# Patient Record
Sex: Female | Born: 1945
Health system: Southern US, Community
[De-identification: ages and names within clinical notes are randomized; demographics above are authoritative.]

## PROBLEM LIST (undated history)

## (undated) ENCOUNTER — Emergency Department (HOSPITAL_COMMUNITY): Payer: Self-pay | Source: Home / Self Care

## (undated) DIAGNOSIS — R251 Tremor, unspecified: Secondary | ICD-10-CM

## (undated) DIAGNOSIS — I10 Essential (primary) hypertension: Secondary | ICD-10-CM

## (undated) DIAGNOSIS — K922 Gastrointestinal hemorrhage, unspecified: Secondary | ICD-10-CM

## (undated) DIAGNOSIS — J45909 Unspecified asthma, uncomplicated: Secondary | ICD-10-CM

## (undated) DIAGNOSIS — C801 Malignant (primary) neoplasm, unspecified: Secondary | ICD-10-CM

## (undated) DIAGNOSIS — I2510 Atherosclerotic heart disease of native coronary artery without angina pectoris: Secondary | ICD-10-CM

## (undated) DIAGNOSIS — E119 Type 2 diabetes mellitus without complications: Secondary | ICD-10-CM

## (undated) DIAGNOSIS — R569 Unspecified convulsions: Secondary | ICD-10-CM

## (undated) DIAGNOSIS — R519 Headache, unspecified: Secondary | ICD-10-CM

## (undated) DIAGNOSIS — I639 Cerebral infarction, unspecified: Secondary | ICD-10-CM

## (undated) DIAGNOSIS — J449 Chronic obstructive pulmonary disease, unspecified: Secondary | ICD-10-CM

## (undated) DIAGNOSIS — E785 Hyperlipidemia, unspecified: Secondary | ICD-10-CM

## (undated) DIAGNOSIS — R51 Headache: Secondary | ICD-10-CM

## (undated) DIAGNOSIS — E079 Disorder of thyroid, unspecified: Secondary | ICD-10-CM

## (undated) DIAGNOSIS — K219 Gastro-esophageal reflux disease without esophagitis: Secondary | ICD-10-CM

## (undated) DIAGNOSIS — I4891 Unspecified atrial fibrillation: Secondary | ICD-10-CM

## (undated) DIAGNOSIS — C4490 Unspecified malignant neoplasm of skin, unspecified: Secondary | ICD-10-CM

## (undated) DIAGNOSIS — I509 Heart failure, unspecified: Secondary | ICD-10-CM

## (undated) DIAGNOSIS — M1990 Unspecified osteoarthritis, unspecified site: Secondary | ICD-10-CM

## (undated) HISTORY — DX: Headache, unspecified: R51.9

## (undated) HISTORY — PX: ROTATOR CUFF REPAIR: SHX139

## (undated) HISTORY — PX: OTHER SURGICAL HISTORY: SHX169

## (undated) HISTORY — PX: KNEE SURGERY: SHX244

## (undated) HISTORY — PX: BACK SURGERY: SHX140

## (undated) HISTORY — PX: CAROTID ENDARTERECTOMY: SUR193

## (undated) HISTORY — DX: Gastro-esophageal reflux disease without esophagitis: K21.9

## (undated) HISTORY — DX: Headache: R51

## (undated) HISTORY — DX: Disorder of thyroid, unspecified: E07.9

---
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
## 1974-01-13 HISTORY — PX: ABDOMINAL HYSTERECTOMY: SHX81

## 1978-01-13 HISTORY — PX: CHOLECYSTECTOMY: SHX55

## 1978-01-13 HISTORY — PX: APPENDECTOMY: SHX54

## 1978-01-13 HISTORY — PX: GALLBLADDER SURGERY: SHX652

## 2003-03-29 ENCOUNTER — Other Ambulatory Visit: Payer: Self-pay

## 2003-04-03 ENCOUNTER — Other Ambulatory Visit: Payer: Self-pay

## 2003-05-26 ENCOUNTER — Other Ambulatory Visit: Payer: Self-pay

## 2003-09-12 ENCOUNTER — Other Ambulatory Visit: Payer: Self-pay

## 2003-10-14 ENCOUNTER — Encounter: Payer: Self-pay | Admitting: Internal Medicine

## 2003-10-25 ENCOUNTER — Emergency Department: Payer: Self-pay | Admitting: Emergency Medicine

## 2003-10-25 ENCOUNTER — Other Ambulatory Visit: Payer: Self-pay

## 2003-11-08 ENCOUNTER — Other Ambulatory Visit: Payer: Self-pay

## 2003-11-08 ENCOUNTER — Observation Stay: Payer: Self-pay | Admitting: Internal Medicine

## 2003-11-09 ENCOUNTER — Other Ambulatory Visit: Payer: Self-pay

## 2003-11-14 ENCOUNTER — Encounter: Payer: Self-pay | Admitting: Internal Medicine

## 2003-11-25 ENCOUNTER — Other Ambulatory Visit: Payer: Self-pay

## 2003-11-25 ENCOUNTER — Emergency Department: Payer: Self-pay | Admitting: Emergency Medicine

## 2003-12-04 ENCOUNTER — Other Ambulatory Visit: Payer: Self-pay

## 2003-12-04 ENCOUNTER — Emergency Department: Payer: Self-pay | Admitting: Emergency Medicine

## 2003-12-14 ENCOUNTER — Encounter: Payer: Self-pay | Admitting: Internal Medicine

## 2004-01-01 ENCOUNTER — Emergency Department: Payer: Self-pay | Admitting: Emergency Medicine

## 2004-01-14 ENCOUNTER — Encounter: Payer: Self-pay | Admitting: Internal Medicine

## 2004-04-18 ENCOUNTER — Emergency Department: Payer: Self-pay | Admitting: Unknown Physician Specialty

## 2004-06-18 ENCOUNTER — Ambulatory Visit: Payer: Self-pay | Admitting: Rheumatology

## 2004-07-07 ENCOUNTER — Emergency Department: Payer: Self-pay | Admitting: Internal Medicine

## 2004-07-23 ENCOUNTER — Other Ambulatory Visit: Payer: Self-pay

## 2004-07-23 ENCOUNTER — Ambulatory Visit: Payer: Self-pay | Admitting: Unknown Physician Specialty

## 2004-07-26 ENCOUNTER — Inpatient Hospital Stay: Payer: Self-pay | Admitting: Unknown Physician Specialty

## 2004-11-14 ENCOUNTER — Other Ambulatory Visit: Payer: Self-pay

## 2004-11-14 ENCOUNTER — Inpatient Hospital Stay: Payer: Self-pay | Admitting: Internal Medicine

## 2006-07-01 ENCOUNTER — Other Ambulatory Visit: Payer: Self-pay

## 2006-07-01 ENCOUNTER — Emergency Department: Payer: Self-pay | Admitting: Emergency Medicine

## 2006-09-27 ENCOUNTER — Emergency Department: Payer: Self-pay | Admitting: Internal Medicine

## 2006-09-27 ENCOUNTER — Other Ambulatory Visit: Payer: Self-pay

## 2013-01-13 DIAGNOSIS — C4490 Unspecified malignant neoplasm of skin, unspecified: Secondary | ICD-10-CM

## 2013-01-13 HISTORY — DX: Unspecified malignant neoplasm of skin, unspecified: C44.90

## 2014-03-20 DIAGNOSIS — E1149 Type 2 diabetes mellitus with other diabetic neurological complication: Secondary | ICD-10-CM | POA: Diagnosis not present

## 2014-03-20 DIAGNOSIS — M810 Age-related osteoporosis without current pathological fracture: Secondary | ICD-10-CM | POA: Diagnosis not present

## 2014-03-20 DIAGNOSIS — M069 Rheumatoid arthritis, unspecified: Secondary | ICD-10-CM | POA: Diagnosis not present

## 2014-03-20 DIAGNOSIS — K219 Gastro-esophageal reflux disease without esophagitis: Secondary | ICD-10-CM | POA: Diagnosis not present

## 2014-03-20 DIAGNOSIS — I10 Essential (primary) hypertension: Secondary | ICD-10-CM | POA: Diagnosis not present

## 2014-03-20 DIAGNOSIS — Z0000 Encounter for general adult medical examination without abnormal findings: Secondary | ICD-10-CM | POA: Diagnosis not present

## 2014-03-20 DIAGNOSIS — J449 Chronic obstructive pulmonary disease, unspecified: Secondary | ICD-10-CM | POA: Diagnosis not present

## 2014-03-20 DIAGNOSIS — S32010A Wedge compression fracture of first lumbar vertebra, initial encounter for closed fracture: Secondary | ICD-10-CM | POA: Diagnosis not present

## 2014-03-20 DIAGNOSIS — E039 Hypothyroidism, unspecified: Secondary | ICD-10-CM | POA: Diagnosis not present

## 2014-03-20 DIAGNOSIS — E785 Hyperlipidemia, unspecified: Secondary | ICD-10-CM | POA: Diagnosis not present

## 2014-03-20 DIAGNOSIS — E559 Vitamin D deficiency, unspecified: Secondary | ICD-10-CM | POA: Diagnosis not present

## 2014-03-20 DIAGNOSIS — G40909 Epilepsy, unspecified, not intractable, without status epilepticus: Secondary | ICD-10-CM | POA: Diagnosis not present

## 2014-03-20 DIAGNOSIS — I471 Supraventricular tachycardia: Secondary | ICD-10-CM | POA: Diagnosis not present

## 2014-04-06 DIAGNOSIS — H109 Unspecified conjunctivitis: Secondary | ICD-10-CM | POA: Diagnosis not present

## 2014-04-12 DIAGNOSIS — F445 Conversion disorder with seizures or convulsions: Secondary | ICD-10-CM | POA: Diagnosis not present

## 2014-04-12 DIAGNOSIS — F411 Generalized anxiety disorder: Secondary | ICD-10-CM | POA: Diagnosis not present

## 2014-04-12 DIAGNOSIS — I679 Cerebrovascular disease, unspecified: Secondary | ICD-10-CM | POA: Diagnosis not present

## 2014-04-12 DIAGNOSIS — G451 Carotid artery syndrome (hemispheric): Secondary | ICD-10-CM | POA: Diagnosis not present

## 2014-04-24 DIAGNOSIS — I517 Cardiomegaly: Secondary | ICD-10-CM | POA: Diagnosis not present

## 2014-04-24 DIAGNOSIS — G8929 Other chronic pain: Secondary | ICD-10-CM | POA: Diagnosis present

## 2014-04-24 DIAGNOSIS — M6281 Muscle weakness (generalized): Secondary | ICD-10-CM | POA: Diagnosis not present

## 2014-04-24 DIAGNOSIS — G40909 Epilepsy, unspecified, not intractable, without status epilepticus: Secondary | ICD-10-CM | POA: Diagnosis present

## 2014-04-24 DIAGNOSIS — I10 Essential (primary) hypertension: Secondary | ICD-10-CM | POA: Diagnosis not present

## 2014-04-24 DIAGNOSIS — Z955 Presence of coronary angioplasty implant and graft: Secondary | ICD-10-CM | POA: Diagnosis not present

## 2014-04-24 DIAGNOSIS — R29810 Facial weakness: Secondary | ICD-10-CM | POA: Diagnosis present

## 2014-04-24 DIAGNOSIS — F17210 Nicotine dependence, cigarettes, uncomplicated: Secondary | ICD-10-CM | POA: Diagnosis not present

## 2014-04-24 DIAGNOSIS — J449 Chronic obstructive pulmonary disease, unspecified: Secondary | ICD-10-CM | POA: Diagnosis not present

## 2014-04-24 DIAGNOSIS — R531 Weakness: Secondary | ICD-10-CM | POA: Diagnosis not present

## 2014-04-24 DIAGNOSIS — I509 Heart failure, unspecified: Secondary | ICD-10-CM | POA: Diagnosis not present

## 2014-04-24 DIAGNOSIS — R4182 Altered mental status, unspecified: Secondary | ICD-10-CM | POA: Diagnosis not present

## 2014-04-24 DIAGNOSIS — M810 Age-related osteoporosis without current pathological fracture: Secondary | ICD-10-CM | POA: Diagnosis present

## 2014-04-24 DIAGNOSIS — I252 Old myocardial infarction: Secondary | ICD-10-CM | POA: Diagnosis not present

## 2014-04-24 DIAGNOSIS — I63312 Cerebral infarction due to thrombosis of left middle cerebral artery: Secondary | ICD-10-CM | POA: Diagnosis not present

## 2014-04-24 DIAGNOSIS — I6350 Cerebral infarction due to unspecified occlusion or stenosis of unspecified cerebral artery: Secondary | ICD-10-CM | POA: Diagnosis not present

## 2014-04-24 DIAGNOSIS — Z0389 Encounter for observation for other suspected diseases and conditions ruled out: Secondary | ICD-10-CM | POA: Diagnosis not present

## 2014-04-24 DIAGNOSIS — F40240 Claustrophobia: Secondary | ICD-10-CM | POA: Diagnosis present

## 2014-04-24 DIAGNOSIS — I6523 Occlusion and stenosis of bilateral carotid arteries: Secondary | ICD-10-CM | POA: Diagnosis not present

## 2014-04-24 DIAGNOSIS — G8194 Hemiplegia, unspecified affecting left nondominant side: Secondary | ICD-10-CM | POA: Diagnosis not present

## 2014-04-24 DIAGNOSIS — F329 Major depressive disorder, single episode, unspecified: Secondary | ICD-10-CM | POA: Diagnosis not present

## 2014-04-24 DIAGNOSIS — I471 Supraventricular tachycardia: Secondary | ICD-10-CM | POA: Diagnosis not present

## 2014-04-24 DIAGNOSIS — E785 Hyperlipidemia, unspecified: Secondary | ICD-10-CM | POA: Diagnosis present

## 2014-04-24 DIAGNOSIS — R569 Unspecified convulsions: Secondary | ICD-10-CM | POA: Diagnosis not present

## 2014-04-24 DIAGNOSIS — R404 Transient alteration of awareness: Secondary | ICD-10-CM | POA: Diagnosis not present

## 2014-04-24 DIAGNOSIS — I639 Cerebral infarction, unspecified: Secondary | ICD-10-CM | POA: Diagnosis not present

## 2014-04-24 DIAGNOSIS — I2510 Atherosclerotic heart disease of native coronary artery without angina pectoris: Secondary | ICD-10-CM | POA: Diagnosis not present

## 2014-04-24 DIAGNOSIS — D329 Benign neoplasm of meninges, unspecified: Secondary | ICD-10-CM | POA: Diagnosis not present

## 2014-04-24 DIAGNOSIS — R251 Tremor, unspecified: Secondary | ICD-10-CM | POA: Diagnosis present

## 2014-04-24 DIAGNOSIS — E119 Type 2 diabetes mellitus without complications: Secondary | ICD-10-CM | POA: Diagnosis not present

## 2014-04-24 DIAGNOSIS — D420 Neoplasm of uncertain behavior of cerebral meninges: Secondary | ICD-10-CM | POA: Diagnosis not present

## 2014-04-24 DIAGNOSIS — M6289 Other specified disorders of muscle: Secondary | ICD-10-CM | POA: Diagnosis not present

## 2014-04-24 DIAGNOSIS — Z720 Tobacco use: Secondary | ICD-10-CM | POA: Diagnosis not present

## 2014-05-04 DIAGNOSIS — Z720 Tobacco use: Secondary | ICD-10-CM | POA: Diagnosis not present

## 2014-05-04 DIAGNOSIS — S8992XA Unspecified injury of left lower leg, initial encounter: Secondary | ICD-10-CM | POA: Diagnosis not present

## 2014-05-04 DIAGNOSIS — M25462 Effusion, left knee: Secondary | ICD-10-CM | POA: Diagnosis not present

## 2014-05-04 DIAGNOSIS — I10 Essential (primary) hypertension: Secondary | ICD-10-CM | POA: Diagnosis not present

## 2014-05-04 DIAGNOSIS — Y999 Unspecified external cause status: Secondary | ICD-10-CM | POA: Diagnosis not present

## 2014-05-04 DIAGNOSIS — M25562 Pain in left knee: Secondary | ICD-10-CM | POA: Diagnosis not present

## 2014-05-04 DIAGNOSIS — E1140 Type 2 diabetes mellitus with diabetic neuropathy, unspecified: Secondary | ICD-10-CM | POA: Diagnosis not present

## 2014-05-04 DIAGNOSIS — S8002XA Contusion of left knee, initial encounter: Secondary | ICD-10-CM | POA: Diagnosis not present

## 2014-05-04 DIAGNOSIS — I252 Old myocardial infarction: Secondary | ICD-10-CM | POA: Diagnosis not present

## 2014-05-04 DIAGNOSIS — Z8673 Personal history of transient ischemic attack (TIA), and cerebral infarction without residual deficits: Secondary | ICD-10-CM | POA: Diagnosis not present

## 2014-05-04 DIAGNOSIS — J449 Chronic obstructive pulmonary disease, unspecified: Secondary | ICD-10-CM | POA: Diagnosis not present

## 2014-05-24 DIAGNOSIS — D329 Benign neoplasm of meninges, unspecified: Secondary | ICD-10-CM | POA: Diagnosis not present

## 2014-05-24 DIAGNOSIS — F411 Generalized anxiety disorder: Secondary | ICD-10-CM | POA: Diagnosis not present

## 2014-05-24 DIAGNOSIS — F445 Conversion disorder with seizures or convulsions: Secondary | ICD-10-CM | POA: Diagnosis not present

## 2014-05-24 DIAGNOSIS — R251 Tremor, unspecified: Secondary | ICD-10-CM | POA: Diagnosis not present

## 2014-05-25 DIAGNOSIS — I2510 Atherosclerotic heart disease of native coronary artery without angina pectoris: Secondary | ICD-10-CM | POA: Diagnosis not present

## 2014-05-25 DIAGNOSIS — R630 Anorexia: Secondary | ICD-10-CM | POA: Diagnosis not present

## 2014-05-25 DIAGNOSIS — M25562 Pain in left knee: Secondary | ICD-10-CM | POA: Diagnosis not present

## 2014-05-25 DIAGNOSIS — I10 Essential (primary) hypertension: Secondary | ICD-10-CM | POA: Diagnosis not present

## 2014-05-25 DIAGNOSIS — E039 Hypothyroidism, unspecified: Secondary | ICD-10-CM | POA: Diagnosis not present

## 2014-05-25 DIAGNOSIS — D429 Neoplasm of uncertain behavior of meninges, unspecified: Secondary | ICD-10-CM | POA: Diagnosis not present

## 2014-05-25 DIAGNOSIS — E1149 Type 2 diabetes mellitus with other diabetic neurological complication: Secondary | ICD-10-CM | POA: Diagnosis not present

## 2014-06-16 DIAGNOSIS — E1149 Type 2 diabetes mellitus with other diabetic neurological complication: Secondary | ICD-10-CM | POA: Diagnosis not present

## 2014-06-19 DIAGNOSIS — F17218 Nicotine dependence, cigarettes, with other nicotine-induced disorders: Secondary | ICD-10-CM | POA: Diagnosis not present

## 2014-06-19 DIAGNOSIS — M6281 Muscle weakness (generalized): Secondary | ICD-10-CM | POA: Diagnosis present

## 2014-06-19 DIAGNOSIS — F17210 Nicotine dependence, cigarettes, uncomplicated: Secondary | ICD-10-CM | POA: Diagnosis not present

## 2014-06-19 DIAGNOSIS — F446 Conversion disorder with sensory symptom or deficit: Secondary | ICD-10-CM | POA: Diagnosis not present

## 2014-06-19 DIAGNOSIS — I6503 Occlusion and stenosis of bilateral vertebral arteries: Secondary | ICD-10-CM | POA: Diagnosis not present

## 2014-06-19 DIAGNOSIS — E780 Pure hypercholesterolemia: Secondary | ICD-10-CM | POA: Diagnosis present

## 2014-06-19 DIAGNOSIS — Z780 Asymptomatic menopausal state: Secondary | ICD-10-CM | POA: Diagnosis not present

## 2014-06-19 DIAGNOSIS — I2510 Atherosclerotic heart disease of native coronary artery without angina pectoris: Secondary | ICD-10-CM | POA: Diagnosis present

## 2014-06-19 DIAGNOSIS — F419 Anxiety disorder, unspecified: Secondary | ICD-10-CM | POA: Diagnosis present

## 2014-06-19 DIAGNOSIS — I10 Essential (primary) hypertension: Secondary | ICD-10-CM | POA: Diagnosis not present

## 2014-06-19 DIAGNOSIS — M4806 Spinal stenosis, lumbar region: Secondary | ICD-10-CM | POA: Diagnosis not present

## 2014-06-19 DIAGNOSIS — D649 Anemia, unspecified: Secondary | ICD-10-CM | POA: Diagnosis not present

## 2014-06-19 DIAGNOSIS — F449 Dissociative and conversion disorder, unspecified: Secondary | ICD-10-CM | POA: Diagnosis not present

## 2014-06-19 DIAGNOSIS — D320 Benign neoplasm of cerebral meninges: Secondary | ICD-10-CM | POA: Diagnosis present

## 2014-06-19 DIAGNOSIS — G459 Transient cerebral ischemic attack, unspecified: Secondary | ICD-10-CM | POA: Diagnosis not present

## 2014-06-19 DIAGNOSIS — R569 Unspecified convulsions: Secondary | ICD-10-CM | POA: Diagnosis not present

## 2014-06-19 DIAGNOSIS — F329 Major depressive disorder, single episode, unspecified: Secondary | ICD-10-CM | POA: Diagnosis present

## 2014-06-19 DIAGNOSIS — J45909 Unspecified asthma, uncomplicated: Secondary | ICD-10-CM | POA: Diagnosis present

## 2014-06-19 DIAGNOSIS — I517 Cardiomegaly: Secondary | ICD-10-CM | POA: Diagnosis not present

## 2014-06-19 DIAGNOSIS — Z79899 Other long term (current) drug therapy: Secondary | ICD-10-CM | POA: Diagnosis not present

## 2014-06-19 DIAGNOSIS — G40909 Epilepsy, unspecified, not intractable, without status epilepticus: Secondary | ICD-10-CM | POA: Diagnosis present

## 2014-06-19 DIAGNOSIS — E119 Type 2 diabetes mellitus without complications: Secondary | ICD-10-CM | POA: Diagnosis not present

## 2014-06-19 DIAGNOSIS — Z8673 Personal history of transient ischemic attack (TIA), and cerebral infarction without residual deficits: Secondary | ICD-10-CM | POA: Diagnosis not present

## 2014-06-19 DIAGNOSIS — M810 Age-related osteoporosis without current pathological fracture: Secondary | ICD-10-CM | POA: Diagnosis not present

## 2014-06-19 DIAGNOSIS — R29810 Facial weakness: Secondary | ICD-10-CM | POA: Diagnosis not present

## 2014-06-19 DIAGNOSIS — R4182 Altered mental status, unspecified: Secondary | ICD-10-CM | POA: Diagnosis not present

## 2014-06-19 DIAGNOSIS — G451 Carotid artery syndrome (hemispheric): Secondary | ICD-10-CM | POA: Diagnosis not present

## 2014-06-19 DIAGNOSIS — R251 Tremor, unspecified: Secondary | ICD-10-CM | POA: Diagnosis not present

## 2014-07-05 DIAGNOSIS — M25562 Pain in left knee: Secondary | ICD-10-CM | POA: Diagnosis not present

## 2014-07-20 DIAGNOSIS — N644 Mastodynia: Secondary | ICD-10-CM | POA: Diagnosis not present

## 2014-07-20 DIAGNOSIS — N63 Unspecified lump in breast: Secondary | ICD-10-CM | POA: Diagnosis not present

## 2014-07-20 DIAGNOSIS — K529 Noninfective gastroenteritis and colitis, unspecified: Secondary | ICD-10-CM | POA: Diagnosis not present

## 2014-07-21 DIAGNOSIS — N63 Unspecified lump in breast: Secondary | ICD-10-CM | POA: Diagnosis not present

## 2014-07-21 DIAGNOSIS — N6489 Other specified disorders of breast: Secondary | ICD-10-CM | POA: Diagnosis not present

## 2014-07-21 DIAGNOSIS — R928 Other abnormal and inconclusive findings on diagnostic imaging of breast: Secondary | ICD-10-CM | POA: Diagnosis not present

## 2014-07-23 DIAGNOSIS — G40909 Epilepsy, unspecified, not intractable, without status epilepticus: Secondary | ICD-10-CM | POA: Diagnosis not present

## 2014-07-23 DIAGNOSIS — F449 Dissociative and conversion disorder, unspecified: Secondary | ICD-10-CM | POA: Diagnosis not present

## 2014-07-23 DIAGNOSIS — F17210 Nicotine dependence, cigarettes, uncomplicated: Secondary | ICD-10-CM | POA: Diagnosis not present

## 2014-07-23 DIAGNOSIS — D429 Neoplasm of uncertain behavior of meninges, unspecified: Secondary | ICD-10-CM | POA: Diagnosis not present

## 2014-07-23 DIAGNOSIS — E1149 Type 2 diabetes mellitus with other diabetic neurological complication: Secondary | ICD-10-CM | POA: Diagnosis not present

## 2014-07-23 DIAGNOSIS — J449 Chronic obstructive pulmonary disease, unspecified: Secondary | ICD-10-CM | POA: Diagnosis not present

## 2014-08-21 DIAGNOSIS — Z885 Allergy status to narcotic agent status: Secondary | ICD-10-CM | POA: Diagnosis not present

## 2014-08-21 DIAGNOSIS — E785 Hyperlipidemia, unspecified: Secondary | ICD-10-CM | POA: Diagnosis not present

## 2014-08-21 DIAGNOSIS — E1140 Type 2 diabetes mellitus with diabetic neuropathy, unspecified: Secondary | ICD-10-CM | POA: Diagnosis not present

## 2014-08-21 DIAGNOSIS — Z7982 Long term (current) use of aspirin: Secondary | ICD-10-CM | POA: Diagnosis not present

## 2014-08-21 DIAGNOSIS — D329 Benign neoplasm of meninges, unspecified: Secondary | ICD-10-CM | POA: Diagnosis not present

## 2014-08-21 DIAGNOSIS — M810 Age-related osteoporosis without current pathological fracture: Secondary | ICD-10-CM | POA: Diagnosis not present

## 2014-08-21 DIAGNOSIS — I712 Thoracic aortic aneurysm, without rupture: Secondary | ICD-10-CM | POA: Diagnosis not present

## 2014-08-21 DIAGNOSIS — I10 Essential (primary) hypertension: Secondary | ICD-10-CM | POA: Diagnosis not present

## 2014-08-21 DIAGNOSIS — Q200 Common arterial trunk: Secondary | ICD-10-CM | POA: Diagnosis not present

## 2014-08-21 DIAGNOSIS — R918 Other nonspecific abnormal finding of lung field: Secondary | ICD-10-CM | POA: Diagnosis not present

## 2014-08-21 DIAGNOSIS — I2510 Atherosclerotic heart disease of native coronary artery without angina pectoris: Secondary | ICD-10-CM | POA: Diagnosis not present

## 2014-08-21 DIAGNOSIS — F17210 Nicotine dependence, cigarettes, uncomplicated: Secondary | ICD-10-CM | POA: Diagnosis not present

## 2014-08-21 DIAGNOSIS — J439 Emphysema, unspecified: Secondary | ICD-10-CM | POA: Diagnosis not present

## 2014-08-21 DIAGNOSIS — R001 Bradycardia, unspecified: Secondary | ICD-10-CM | POA: Diagnosis not present

## 2014-08-21 DIAGNOSIS — R079 Chest pain, unspecified: Secondary | ICD-10-CM | POA: Diagnosis not present

## 2014-08-21 DIAGNOSIS — J45909 Unspecified asthma, uncomplicated: Secondary | ICD-10-CM | POA: Diagnosis not present

## 2014-08-21 DIAGNOSIS — J449 Chronic obstructive pulmonary disease, unspecified: Secondary | ICD-10-CM | POA: Diagnosis not present

## 2014-08-21 DIAGNOSIS — I252 Old myocardial infarction: Secondary | ICD-10-CM | POA: Diagnosis not present

## 2014-08-21 DIAGNOSIS — M47814 Spondylosis without myelopathy or radiculopathy, thoracic region: Secondary | ICD-10-CM | POA: Diagnosis not present

## 2014-08-21 DIAGNOSIS — R0789 Other chest pain: Secondary | ICD-10-CM | POA: Diagnosis not present

## 2014-08-21 DIAGNOSIS — Z8673 Personal history of transient ischemic attack (TIA), and cerebral infarction without residual deficits: Secondary | ICD-10-CM | POA: Diagnosis not present

## 2014-08-21 DIAGNOSIS — F329 Major depressive disorder, single episode, unspecified: Secondary | ICD-10-CM | POA: Diagnosis not present

## 2014-08-21 DIAGNOSIS — R251 Tremor, unspecified: Secondary | ICD-10-CM | POA: Diagnosis not present

## 2014-08-21 DIAGNOSIS — F445 Conversion disorder with seizures or convulsions: Secondary | ICD-10-CM | POA: Diagnosis not present

## 2014-08-21 DIAGNOSIS — E119 Type 2 diabetes mellitus without complications: Secondary | ICD-10-CM | POA: Diagnosis not present

## 2014-08-21 DIAGNOSIS — F419 Anxiety disorder, unspecified: Secondary | ICD-10-CM | POA: Diagnosis not present

## 2014-08-21 DIAGNOSIS — J8410 Pulmonary fibrosis, unspecified: Secondary | ICD-10-CM | POA: Diagnosis not present

## 2014-08-21 DIAGNOSIS — E780 Pure hypercholesterolemia: Secondary | ICD-10-CM | POA: Diagnosis not present

## 2014-08-21 DIAGNOSIS — G40909 Epilepsy, unspecified, not intractable, without status epilepticus: Secondary | ICD-10-CM | POA: Diagnosis not present

## 2014-08-21 DIAGNOSIS — R0602 Shortness of breath: Secondary | ICD-10-CM | POA: Diagnosis not present

## 2014-08-21 DIAGNOSIS — G459 Transient cerebral ischemic attack, unspecified: Secondary | ICD-10-CM | POA: Diagnosis not present

## 2014-08-21 DIAGNOSIS — I700 Atherosclerosis of aorta: Secondary | ICD-10-CM | POA: Diagnosis not present

## 2014-08-22 DIAGNOSIS — R079 Chest pain, unspecified: Secondary | ICD-10-CM | POA: Diagnosis not present

## 2014-08-22 DIAGNOSIS — I10 Essential (primary) hypertension: Secondary | ICD-10-CM | POA: Diagnosis not present

## 2014-08-22 DIAGNOSIS — F445 Conversion disorder with seizures or convulsions: Secondary | ICD-10-CM | POA: Diagnosis not present

## 2014-08-22 DIAGNOSIS — R001 Bradycardia, unspecified: Secondary | ICD-10-CM | POA: Diagnosis not present

## 2014-08-22 DIAGNOSIS — E1140 Type 2 diabetes mellitus with diabetic neuropathy, unspecified: Secondary | ICD-10-CM | POA: Diagnosis not present

## 2014-08-22 DIAGNOSIS — I2510 Atherosclerotic heart disease of native coronary artery without angina pectoris: Secondary | ICD-10-CM | POA: Diagnosis not present

## 2014-08-22 DIAGNOSIS — F17210 Nicotine dependence, cigarettes, uncomplicated: Secondary | ICD-10-CM | POA: Diagnosis not present

## 2014-08-22 DIAGNOSIS — J449 Chronic obstructive pulmonary disease, unspecified: Secondary | ICD-10-CM | POA: Diagnosis not present

## 2014-08-22 DIAGNOSIS — F419 Anxiety disorder, unspecified: Secondary | ICD-10-CM | POA: Diagnosis not present

## 2014-08-23 DIAGNOSIS — R0600 Dyspnea, unspecified: Secondary | ICD-10-CM | POA: Diagnosis not present

## 2014-08-23 DIAGNOSIS — J449 Chronic obstructive pulmonary disease, unspecified: Secondary | ICD-10-CM | POA: Diagnosis not present

## 2014-08-23 DIAGNOSIS — Z8673 Personal history of transient ischemic attack (TIA), and cerebral infarction without residual deficits: Secondary | ICD-10-CM | POA: Diagnosis not present

## 2014-08-23 DIAGNOSIS — R0789 Other chest pain: Secondary | ICD-10-CM | POA: Diagnosis not present

## 2014-08-23 DIAGNOSIS — Z8249 Family history of ischemic heart disease and other diseases of the circulatory system: Secondary | ICD-10-CM | POA: Diagnosis not present

## 2014-08-23 DIAGNOSIS — I10 Essential (primary) hypertension: Secondary | ICD-10-CM | POA: Diagnosis not present

## 2014-08-23 DIAGNOSIS — E119 Type 2 diabetes mellitus without complications: Secondary | ICD-10-CM | POA: Diagnosis not present

## 2014-08-23 DIAGNOSIS — M810 Age-related osteoporosis without current pathological fracture: Secondary | ICD-10-CM | POA: Diagnosis not present

## 2014-08-23 DIAGNOSIS — F17210 Nicotine dependence, cigarettes, uncomplicated: Secondary | ICD-10-CM | POA: Diagnosis not present

## 2014-08-23 DIAGNOSIS — R079 Chest pain, unspecified: Secondary | ICD-10-CM | POA: Diagnosis not present

## 2014-08-23 DIAGNOSIS — J4520 Mild intermittent asthma, uncomplicated: Secondary | ICD-10-CM | POA: Diagnosis not present

## 2014-08-23 DIAGNOSIS — G40909 Epilepsy, unspecified, not intractable, without status epilepticus: Secondary | ICD-10-CM | POA: Diagnosis not present

## 2014-08-23 DIAGNOSIS — Z885 Allergy status to narcotic agent status: Secondary | ICD-10-CM | POA: Diagnosis not present

## 2014-08-23 DIAGNOSIS — R51 Headache: Secondary | ICD-10-CM | POA: Diagnosis not present

## 2014-08-23 DIAGNOSIS — R0602 Shortness of breath: Secondary | ICD-10-CM | POA: Diagnosis not present

## 2014-08-24 DIAGNOSIS — R251 Tremor, unspecified: Secondary | ICD-10-CM | POA: Diagnosis not present

## 2014-08-24 DIAGNOSIS — D329 Benign neoplasm of meninges, unspecified: Secondary | ICD-10-CM | POA: Diagnosis not present

## 2014-08-24 DIAGNOSIS — F445 Conversion disorder with seizures or convulsions: Secondary | ICD-10-CM | POA: Diagnosis not present

## 2014-08-24 DIAGNOSIS — F411 Generalized anxiety disorder: Secondary | ICD-10-CM | POA: Diagnosis not present

## 2014-08-28 DIAGNOSIS — M1990 Unspecified osteoarthritis, unspecified site: Secondary | ICD-10-CM | POA: Diagnosis not present

## 2014-08-28 DIAGNOSIS — M19042 Primary osteoarthritis, left hand: Secondary | ICD-10-CM | POA: Diagnosis not present

## 2014-08-28 DIAGNOSIS — R0789 Other chest pain: Secondary | ICD-10-CM | POA: Diagnosis not present

## 2014-08-28 DIAGNOSIS — M19041 Primary osteoarthritis, right hand: Secondary | ICD-10-CM | POA: Diagnosis not present

## 2014-08-28 DIAGNOSIS — M79642 Pain in left hand: Secondary | ICD-10-CM | POA: Diagnosis not present

## 2014-08-28 DIAGNOSIS — E1149 Type 2 diabetes mellitus with other diabetic neurological complication: Secondary | ICD-10-CM | POA: Diagnosis not present

## 2014-08-28 DIAGNOSIS — M79641 Pain in right hand: Secondary | ICD-10-CM | POA: Diagnosis not present

## 2014-08-28 DIAGNOSIS — R911 Solitary pulmonary nodule: Secondary | ICD-10-CM | POA: Diagnosis not present

## 2014-08-28 DIAGNOSIS — Z79899 Other long term (current) drug therapy: Secondary | ICD-10-CM | POA: Diagnosis not present

## 2014-09-07 DIAGNOSIS — R0689 Other abnormalities of breathing: Secondary | ICD-10-CM | POA: Diagnosis not present

## 2014-09-07 DIAGNOSIS — J449 Chronic obstructive pulmonary disease, unspecified: Secondary | ICD-10-CM | POA: Diagnosis not present

## 2014-09-07 DIAGNOSIS — R0600 Dyspnea, unspecified: Secondary | ICD-10-CM | POA: Diagnosis not present

## 2014-09-07 DIAGNOSIS — F17200 Nicotine dependence, unspecified, uncomplicated: Secondary | ICD-10-CM | POA: Diagnosis not present

## 2014-09-11 DIAGNOSIS — J439 Emphysema, unspecified: Secondary | ICD-10-CM | POA: Diagnosis not present

## 2014-09-11 DIAGNOSIS — I679 Cerebrovascular disease, unspecified: Secondary | ICD-10-CM | POA: Diagnosis not present

## 2014-09-11 DIAGNOSIS — I2510 Atherosclerotic heart disease of native coronary artery without angina pectoris: Secondary | ICD-10-CM | POA: Diagnosis not present

## 2014-09-11 DIAGNOSIS — G459 Transient cerebral ischemic attack, unspecified: Secondary | ICD-10-CM | POA: Diagnosis not present

## 2014-09-19 DIAGNOSIS — J449 Chronic obstructive pulmonary disease, unspecified: Secondary | ICD-10-CM | POA: Diagnosis not present

## 2014-09-21 DIAGNOSIS — R0600 Dyspnea, unspecified: Secondary | ICD-10-CM | POA: Diagnosis not present

## 2014-09-21 DIAGNOSIS — J449 Chronic obstructive pulmonary disease, unspecified: Secondary | ICD-10-CM | POA: Diagnosis not present

## 2014-09-21 DIAGNOSIS — G4700 Insomnia, unspecified: Secondary | ICD-10-CM | POA: Diagnosis not present

## 2014-09-28 DIAGNOSIS — Z955 Presence of coronary angioplasty implant and graft: Secondary | ICD-10-CM | POA: Diagnosis not present

## 2014-09-28 DIAGNOSIS — J45909 Unspecified asthma, uncomplicated: Secondary | ICD-10-CM | POA: Diagnosis not present

## 2014-09-28 DIAGNOSIS — Z7982 Long term (current) use of aspirin: Secondary | ICD-10-CM | POA: Diagnosis not present

## 2014-09-28 DIAGNOSIS — Z7952 Long term (current) use of systemic steroids: Secondary | ICD-10-CM | POA: Diagnosis not present

## 2014-09-28 DIAGNOSIS — J449 Chronic obstructive pulmonary disease, unspecified: Secondary | ICD-10-CM | POA: Diagnosis not present

## 2014-09-28 DIAGNOSIS — S52501A Unspecified fracture of the lower end of right radius, initial encounter for closed fracture: Secondary | ICD-10-CM | POA: Diagnosis not present

## 2014-09-28 DIAGNOSIS — S52591A Other fractures of lower end of right radius, initial encounter for closed fracture: Secondary | ICD-10-CM | POA: Diagnosis not present

## 2014-09-28 DIAGNOSIS — S52571A Other intraarticular fracture of lower end of right radius, initial encounter for closed fracture: Secondary | ICD-10-CM | POA: Diagnosis not present

## 2014-09-28 DIAGNOSIS — F17210 Nicotine dependence, cigarettes, uncomplicated: Secondary | ICD-10-CM | POA: Diagnosis not present

## 2014-09-28 DIAGNOSIS — Z8673 Personal history of transient ischemic attack (TIA), and cerebral infarction without residual deficits: Secondary | ICD-10-CM | POA: Diagnosis not present

## 2014-09-28 DIAGNOSIS — M791 Myalgia: Secondary | ICD-10-CM | POA: Diagnosis not present

## 2014-09-28 DIAGNOSIS — E119 Type 2 diabetes mellitus without complications: Secondary | ICD-10-CM | POA: Diagnosis not present

## 2014-09-28 DIAGNOSIS — F329 Major depressive disorder, single episode, unspecified: Secondary | ICD-10-CM | POA: Diagnosis not present

## 2014-09-28 DIAGNOSIS — I10 Essential (primary) hypertension: Secondary | ICD-10-CM | POA: Diagnosis not present

## 2014-09-28 DIAGNOSIS — Z79899 Other long term (current) drug therapy: Secondary | ICD-10-CM | POA: Diagnosis not present

## 2014-09-28 DIAGNOSIS — M79641 Pain in right hand: Secondary | ICD-10-CM | POA: Diagnosis not present

## 2014-09-28 DIAGNOSIS — E780 Pure hypercholesterolemia: Secondary | ICD-10-CM | POA: Diagnosis not present

## 2014-10-03 DIAGNOSIS — M25531 Pain in right wrist: Secondary | ICD-10-CM | POA: Diagnosis not present

## 2014-10-03 DIAGNOSIS — S52571A Other intraarticular fracture of lower end of right radius, initial encounter for closed fracture: Secondary | ICD-10-CM | POA: Diagnosis not present

## 2014-10-04 DIAGNOSIS — L989 Disorder of the skin and subcutaneous tissue, unspecified: Secondary | ICD-10-CM | POA: Diagnosis not present

## 2014-10-04 DIAGNOSIS — E1142 Type 2 diabetes mellitus with diabetic polyneuropathy: Secondary | ICD-10-CM | POA: Diagnosis not present

## 2014-10-04 DIAGNOSIS — S52501A Unspecified fracture of the lower end of right radius, initial encounter for closed fracture: Secondary | ICD-10-CM | POA: Diagnosis not present

## 2014-10-04 DIAGNOSIS — W010XXA Fall on same level from slipping, tripping and stumbling without subsequent striking against object, initial encounter: Secondary | ICD-10-CM | POA: Diagnosis not present

## 2014-10-04 DIAGNOSIS — M810 Age-related osteoporosis without current pathological fracture: Secondary | ICD-10-CM | POA: Diagnosis not present

## 2014-10-11 DIAGNOSIS — J45909 Unspecified asthma, uncomplicated: Secondary | ICD-10-CM | POA: Diagnosis not present

## 2014-10-11 DIAGNOSIS — F17210 Nicotine dependence, cigarettes, uncomplicated: Secondary | ICD-10-CM | POA: Diagnosis not present

## 2014-10-11 DIAGNOSIS — Z885 Allergy status to narcotic agent status: Secondary | ICD-10-CM | POA: Diagnosis not present

## 2014-10-11 DIAGNOSIS — E780 Pure hypercholesterolemia: Secondary | ICD-10-CM | POA: Diagnosis not present

## 2014-10-11 DIAGNOSIS — M810 Age-related osteoporosis without current pathological fracture: Secondary | ICD-10-CM | POA: Diagnosis not present

## 2014-10-11 DIAGNOSIS — G40909 Epilepsy, unspecified, not intractable, without status epilepticus: Secondary | ICD-10-CM | POA: Diagnosis not present

## 2014-10-11 DIAGNOSIS — I10 Essential (primary) hypertension: Secondary | ICD-10-CM | POA: Diagnosis not present

## 2014-10-11 DIAGNOSIS — Z79899 Other long term (current) drug therapy: Secondary | ICD-10-CM | POA: Diagnosis not present

## 2014-10-11 DIAGNOSIS — J449 Chronic obstructive pulmonary disease, unspecified: Secondary | ICD-10-CM | POA: Diagnosis not present

## 2014-10-11 DIAGNOSIS — Z7982 Long term (current) use of aspirin: Secondary | ICD-10-CM | POA: Diagnosis not present

## 2014-10-11 DIAGNOSIS — R51 Headache: Secondary | ICD-10-CM | POA: Diagnosis not present

## 2014-10-11 DIAGNOSIS — Z955 Presence of coronary angioplasty implant and graft: Secondary | ICD-10-CM | POA: Diagnosis not present

## 2014-10-11 DIAGNOSIS — F329 Major depressive disorder, single episode, unspecified: Secondary | ICD-10-CM | POA: Diagnosis not present

## 2014-10-11 DIAGNOSIS — M1990 Unspecified osteoarthritis, unspecified site: Secondary | ICD-10-CM | POA: Diagnosis not present

## 2014-10-11 DIAGNOSIS — E119 Type 2 diabetes mellitus without complications: Secondary | ICD-10-CM | POA: Diagnosis not present

## 2014-10-11 DIAGNOSIS — R42 Dizziness and giddiness: Secondary | ICD-10-CM | POA: Diagnosis not present

## 2014-10-11 DIAGNOSIS — D329 Benign neoplasm of meninges, unspecified: Secondary | ICD-10-CM | POA: Diagnosis not present

## 2014-10-11 DIAGNOSIS — Z8673 Personal history of transient ischemic attack (TIA), and cerebral infarction without residual deficits: Secondary | ICD-10-CM | POA: Diagnosis not present

## 2014-10-11 DIAGNOSIS — R29810 Facial weakness: Secondary | ICD-10-CM | POA: Diagnosis not present

## 2014-10-11 DIAGNOSIS — R7309 Other abnormal glucose: Secondary | ICD-10-CM | POA: Diagnosis not present

## 2014-10-17 DIAGNOSIS — S52571D Other intraarticular fracture of lower end of right radius, subsequent encounter for closed fracture with routine healing: Secondary | ICD-10-CM | POA: Diagnosis not present

## 2014-10-17 DIAGNOSIS — M25531 Pain in right wrist: Secondary | ICD-10-CM | POA: Diagnosis not present

## 2014-10-19 DIAGNOSIS — D485 Neoplasm of uncertain behavior of skin: Secondary | ICD-10-CM | POA: Diagnosis not present

## 2014-10-19 DIAGNOSIS — C44321 Squamous cell carcinoma of skin of nose: Secondary | ICD-10-CM | POA: Diagnosis not present

## 2014-10-19 DIAGNOSIS — L821 Other seborrheic keratosis: Secondary | ICD-10-CM | POA: Diagnosis not present

## 2014-10-19 DIAGNOSIS — D2239 Melanocytic nevi of other parts of face: Secondary | ICD-10-CM | POA: Diagnosis not present

## 2014-11-08 DIAGNOSIS — M25531 Pain in right wrist: Secondary | ICD-10-CM | POA: Diagnosis not present

## 2014-11-08 DIAGNOSIS — S52571D Other intraarticular fracture of lower end of right radius, subsequent encounter for closed fracture with routine healing: Secondary | ICD-10-CM | POA: Diagnosis not present

## 2014-11-15 DIAGNOSIS — C44321 Squamous cell carcinoma of skin of nose: Secondary | ICD-10-CM | POA: Diagnosis not present

## 2014-11-15 DIAGNOSIS — E1149 Type 2 diabetes mellitus with other diabetic neurological complication: Secondary | ICD-10-CM | POA: Diagnosis not present

## 2014-11-15 DIAGNOSIS — Z23 Encounter for immunization: Secondary | ICD-10-CM | POA: Diagnosis not present

## 2014-11-15 DIAGNOSIS — J449 Chronic obstructive pulmonary disease, unspecified: Secondary | ICD-10-CM | POA: Diagnosis not present

## 2014-11-15 DIAGNOSIS — G253 Myoclonus: Secondary | ICD-10-CM | POA: Diagnosis not present

## 2014-11-15 DIAGNOSIS — G4734 Idiopathic sleep related nonobstructive alveolar hypoventilation: Secondary | ICD-10-CM | POA: Diagnosis not present

## 2014-11-15 DIAGNOSIS — E785 Hyperlipidemia, unspecified: Secondary | ICD-10-CM | POA: Diagnosis not present

## 2014-11-15 DIAGNOSIS — F17200 Nicotine dependence, unspecified, uncomplicated: Secondary | ICD-10-CM | POA: Diagnosis not present

## 2014-11-21 DIAGNOSIS — E559 Vitamin D deficiency, unspecified: Secondary | ICD-10-CM | POA: Diagnosis not present

## 2014-11-22 DIAGNOSIS — C44321 Squamous cell carcinoma of skin of nose: Secondary | ICD-10-CM | POA: Diagnosis not present

## 2014-12-21 DIAGNOSIS — F445 Conversion disorder with seizures or convulsions: Secondary | ICD-10-CM | POA: Diagnosis not present

## 2014-12-21 DIAGNOSIS — F419 Anxiety disorder, unspecified: Secondary | ICD-10-CM | POA: Diagnosis not present

## 2014-12-21 DIAGNOSIS — D329 Benign neoplasm of meninges, unspecified: Secondary | ICD-10-CM | POA: Diagnosis not present

## 2014-12-21 DIAGNOSIS — F411 Generalized anxiety disorder: Secondary | ICD-10-CM | POA: Diagnosis not present

## 2014-12-21 DIAGNOSIS — R251 Tremor, unspecified: Secondary | ICD-10-CM | POA: Diagnosis not present

## 2014-12-21 DIAGNOSIS — I679 Cerebrovascular disease, unspecified: Secondary | ICD-10-CM | POA: Diagnosis not present

## 2014-12-21 DIAGNOSIS — G451 Carotid artery syndrome (hemispheric): Secondary | ICD-10-CM | POA: Diagnosis not present

## 2014-12-25 DIAGNOSIS — S52571D Other intraarticular fracture of lower end of right radius, subsequent encounter for closed fracture with routine healing: Secondary | ICD-10-CM | POA: Diagnosis not present

## 2014-12-26 DIAGNOSIS — E1149 Type 2 diabetes mellitus with other diabetic neurological complication: Secondary | ICD-10-CM | POA: Diagnosis not present

## 2014-12-26 DIAGNOSIS — L84 Corns and callosities: Secondary | ICD-10-CM | POA: Diagnosis not present

## 2014-12-26 DIAGNOSIS — B351 Tinea unguium: Secondary | ICD-10-CM | POA: Diagnosis not present

## 2014-12-29 DIAGNOSIS — G40909 Epilepsy, unspecified, not intractable, without status epilepticus: Secondary | ICD-10-CM | POA: Diagnosis not present

## 2014-12-29 DIAGNOSIS — R29810 Facial weakness: Secondary | ICD-10-CM | POA: Diagnosis not present

## 2014-12-29 DIAGNOSIS — R4781 Slurred speech: Secondary | ICD-10-CM | POA: Diagnosis not present

## 2014-12-29 DIAGNOSIS — I69354 Hemiplegia and hemiparesis following cerebral infarction affecting left non-dominant side: Secondary | ICD-10-CM | POA: Diagnosis not present

## 2014-12-29 DIAGNOSIS — I10 Essential (primary) hypertension: Secondary | ICD-10-CM | POA: Diagnosis not present

## 2014-12-29 DIAGNOSIS — J45909 Unspecified asthma, uncomplicated: Secondary | ICD-10-CM | POA: Diagnosis not present

## 2014-12-29 DIAGNOSIS — J449 Chronic obstructive pulmonary disease, unspecified: Secondary | ICD-10-CM | POA: Diagnosis not present

## 2014-12-29 DIAGNOSIS — E119 Type 2 diabetes mellitus without complications: Secondary | ICD-10-CM | POA: Diagnosis not present

## 2014-12-29 DIAGNOSIS — E7800 Pure hypercholesterolemia, unspecified: Secondary | ICD-10-CM | POA: Diagnosis not present

## 2014-12-29 DIAGNOSIS — Z79899 Other long term (current) drug therapy: Secondary | ICD-10-CM | POA: Diagnosis not present

## 2014-12-29 DIAGNOSIS — D329 Benign neoplasm of meninges, unspecified: Secondary | ICD-10-CM | POA: Diagnosis not present

## 2014-12-29 DIAGNOSIS — M810 Age-related osteoporosis without current pathological fracture: Secondary | ICD-10-CM | POA: Diagnosis not present

## 2014-12-29 DIAGNOSIS — Z7984 Long term (current) use of oral hypoglycemic drugs: Secondary | ICD-10-CM | POA: Diagnosis not present

## 2014-12-29 DIAGNOSIS — Z955 Presence of coronary angioplasty implant and graft: Secondary | ICD-10-CM | POA: Diagnosis not present

## 2014-12-29 DIAGNOSIS — I6789 Other cerebrovascular disease: Secondary | ICD-10-CM | POA: Diagnosis not present

## 2014-12-29 DIAGNOSIS — Z7902 Long term (current) use of antithrombotics/antiplatelets: Secondary | ICD-10-CM | POA: Diagnosis not present

## 2014-12-29 DIAGNOSIS — M1990 Unspecified osteoarthritis, unspecified site: Secondary | ICD-10-CM | POA: Diagnosis not present

## 2014-12-29 DIAGNOSIS — F329 Major depressive disorder, single episode, unspecified: Secondary | ICD-10-CM | POA: Diagnosis not present

## 2014-12-29 DIAGNOSIS — F17210 Nicotine dependence, cigarettes, uncomplicated: Secondary | ICD-10-CM | POA: Diagnosis not present

## 2014-12-29 DIAGNOSIS — R51 Headache: Secondary | ICD-10-CM | POA: Diagnosis not present

## 2014-12-29 DIAGNOSIS — F411 Generalized anxiety disorder: Secondary | ICD-10-CM | POA: Diagnosis not present

## 2014-12-29 DIAGNOSIS — G40109 Localization-related (focal) (partial) symptomatic epilepsy and epileptic syndromes with simple partial seizures, not intractable, without status epilepticus: Secondary | ICD-10-CM | POA: Diagnosis not present

## 2015-01-03 DIAGNOSIS — G40109 Localization-related (focal) (partial) symptomatic epilepsy and epileptic syndromes with simple partial seizures, not intractable, without status epilepticus: Secondary | ICD-10-CM | POA: Diagnosis not present

## 2015-01-03 DIAGNOSIS — F445 Conversion disorder with seizures or convulsions: Secondary | ICD-10-CM | POA: Diagnosis not present

## 2015-01-03 DIAGNOSIS — F411 Generalized anxiety disorder: Secondary | ICD-10-CM | POA: Diagnosis not present

## 2015-01-03 DIAGNOSIS — G451 Carotid artery syndrome (hemispheric): Secondary | ICD-10-CM | POA: Diagnosis not present

## 2015-01-03 DIAGNOSIS — I679 Cerebrovascular disease, unspecified: Secondary | ICD-10-CM | POA: Diagnosis not present

## 2015-01-03 DIAGNOSIS — D329 Benign neoplasm of meninges, unspecified: Secondary | ICD-10-CM | POA: Diagnosis not present

## 2015-02-11 DIAGNOSIS — J449 Chronic obstructive pulmonary disease, unspecified: Secondary | ICD-10-CM | POA: Diagnosis not present

## 2015-02-13 DIAGNOSIS — J449 Chronic obstructive pulmonary disease, unspecified: Secondary | ICD-10-CM | POA: Diagnosis not present

## 2015-02-13 DIAGNOSIS — Z1231 Encounter for screening mammogram for malignant neoplasm of breast: Secondary | ICD-10-CM | POA: Diagnosis not present

## 2015-03-02 DIAGNOSIS — M25531 Pain in right wrist: Secondary | ICD-10-CM | POA: Diagnosis not present

## 2015-03-02 DIAGNOSIS — M79641 Pain in right hand: Secondary | ICD-10-CM | POA: Diagnosis not present

## 2015-03-03 DIAGNOSIS — M25531 Pain in right wrist: Secondary | ICD-10-CM | POA: Diagnosis not present

## 2015-03-03 DIAGNOSIS — S52591D Other fractures of lower end of right radius, subsequent encounter for closed fracture with routine healing: Secondary | ICD-10-CM | POA: Diagnosis not present

## 2015-03-03 DIAGNOSIS — M151 Heberden's nodes (with arthropathy): Secondary | ICD-10-CM | POA: Diagnosis not present

## 2015-03-03 DIAGNOSIS — M79641 Pain in right hand: Secondary | ICD-10-CM | POA: Diagnosis not present

## 2015-04-02 DIAGNOSIS — J449 Chronic obstructive pulmonary disease, unspecified: Secondary | ICD-10-CM | POA: Diagnosis not present

## 2015-04-02 DIAGNOSIS — E782 Mixed hyperlipidemia: Secondary | ICD-10-CM | POA: Diagnosis not present

## 2015-04-02 DIAGNOSIS — R05 Cough: Secondary | ICD-10-CM | POA: Diagnosis not present

## 2015-04-02 DIAGNOSIS — E1149 Type 2 diabetes mellitus with other diabetic neurological complication: Secondary | ICD-10-CM | POA: Diagnosis not present

## 2015-04-02 DIAGNOSIS — M1990 Unspecified osteoarthritis, unspecified site: Secondary | ICD-10-CM | POA: Diagnosis not present

## 2015-04-02 DIAGNOSIS — D899 Disorder involving the immune mechanism, unspecified: Secondary | ICD-10-CM | POA: Diagnosis not present

## 2015-04-02 DIAGNOSIS — I10 Essential (primary) hypertension: Secondary | ICD-10-CM | POA: Diagnosis not present

## 2015-04-02 DIAGNOSIS — E039 Hypothyroidism, unspecified: Secondary | ICD-10-CM | POA: Diagnosis not present

## 2015-04-20 DIAGNOSIS — F419 Anxiety disorder, unspecified: Secondary | ICD-10-CM | POA: Diagnosis not present

## 2015-04-20 DIAGNOSIS — I679 Cerebrovascular disease, unspecified: Secondary | ICD-10-CM | POA: Diagnosis not present

## 2015-04-20 DIAGNOSIS — R251 Tremor, unspecified: Secondary | ICD-10-CM | POA: Diagnosis not present

## 2015-04-20 DIAGNOSIS — F411 Generalized anxiety disorder: Secondary | ICD-10-CM | POA: Diagnosis not present

## 2015-04-20 DIAGNOSIS — F445 Conversion disorder with seizures or convulsions: Secondary | ICD-10-CM | POA: Diagnosis not present

## 2015-04-20 DIAGNOSIS — D329 Benign neoplasm of meninges, unspecified: Secondary | ICD-10-CM | POA: Diagnosis not present

## 2015-04-20 DIAGNOSIS — G40109 Localization-related (focal) (partial) symptomatic epilepsy and epileptic syndromes with simple partial seizures, not intractable, without status epilepticus: Secondary | ICD-10-CM | POA: Diagnosis not present

## 2015-04-20 DIAGNOSIS — G451 Carotid artery syndrome (hemispheric): Secondary | ICD-10-CM | POA: Diagnosis not present

## 2015-05-10 DIAGNOSIS — I2510 Atherosclerotic heart disease of native coronary artery without angina pectoris: Secondary | ICD-10-CM | POA: Diagnosis not present

## 2015-05-10 DIAGNOSIS — R0602 Shortness of breath: Secondary | ICD-10-CM | POA: Diagnosis not present

## 2015-05-10 DIAGNOSIS — M1990 Unspecified osteoarthritis, unspecified site: Secondary | ICD-10-CM | POA: Diagnosis not present

## 2015-05-10 DIAGNOSIS — M545 Low back pain: Secondary | ICD-10-CM | POA: Diagnosis not present

## 2015-05-10 DIAGNOSIS — J449 Chronic obstructive pulmonary disease, unspecified: Secondary | ICD-10-CM | POA: Diagnosis not present

## 2015-05-10 DIAGNOSIS — F331 Major depressive disorder, recurrent, moderate: Secondary | ICD-10-CM | POA: Diagnosis not present

## 2015-05-10 DIAGNOSIS — M659 Synovitis and tenosynovitis, unspecified: Secondary | ICD-10-CM | POA: Diagnosis not present

## 2015-05-10 DIAGNOSIS — M5416 Radiculopathy, lumbar region: Secondary | ICD-10-CM | POA: Diagnosis not present

## 2015-05-10 DIAGNOSIS — F17200 Nicotine dependence, unspecified, uncomplicated: Secondary | ICD-10-CM | POA: Diagnosis not present

## 2015-05-14 DIAGNOSIS — I10 Essential (primary) hypertension: Secondary | ICD-10-CM | POA: Diagnosis not present

## 2015-05-14 DIAGNOSIS — F17210 Nicotine dependence, cigarettes, uncomplicated: Secondary | ICD-10-CM | POA: Diagnosis present

## 2015-05-14 DIAGNOSIS — R062 Wheezing: Secondary | ICD-10-CM | POA: Diagnosis not present

## 2015-05-14 DIAGNOSIS — Z7951 Long term (current) use of inhaled steroids: Secondary | ICD-10-CM | POA: Diagnosis not present

## 2015-05-14 DIAGNOSIS — F459 Somatoform disorder, unspecified: Secondary | ICD-10-CM | POA: Diagnosis present

## 2015-05-14 DIAGNOSIS — I69351 Hemiplegia and hemiparesis following cerebral infarction affecting right dominant side: Secondary | ICD-10-CM | POA: Diagnosis not present

## 2015-05-14 DIAGNOSIS — E119 Type 2 diabetes mellitus without complications: Secondary | ICD-10-CM | POA: Diagnosis not present

## 2015-05-14 DIAGNOSIS — J45909 Unspecified asthma, uncomplicated: Secondary | ICD-10-CM | POA: Diagnosis not present

## 2015-05-14 DIAGNOSIS — R531 Weakness: Secondary | ICD-10-CM | POA: Diagnosis not present

## 2015-05-14 DIAGNOSIS — I2510 Atherosclerotic heart disease of native coronary artery without angina pectoris: Secondary | ICD-10-CM | POA: Diagnosis present

## 2015-05-14 DIAGNOSIS — G40909 Epilepsy, unspecified, not intractable, without status epilepticus: Secondary | ICD-10-CM | POA: Diagnosis not present

## 2015-05-14 DIAGNOSIS — I252 Old myocardial infarction: Secondary | ICD-10-CM | POA: Diagnosis not present

## 2015-05-14 DIAGNOSIS — R29810 Facial weakness: Secondary | ICD-10-CM | POA: Diagnosis not present

## 2015-05-14 DIAGNOSIS — E7800 Pure hypercholesterolemia, unspecified: Secondary | ICD-10-CM | POA: Diagnosis not present

## 2015-05-14 DIAGNOSIS — E1140 Type 2 diabetes mellitus with diabetic neuropathy, unspecified: Secondary | ICD-10-CM | POA: Diagnosis not present

## 2015-05-14 DIAGNOSIS — Z7984 Long term (current) use of oral hypoglycemic drugs: Secondary | ICD-10-CM | POA: Diagnosis not present

## 2015-05-14 DIAGNOSIS — I639 Cerebral infarction, unspecified: Secondary | ICD-10-CM | POA: Diagnosis not present

## 2015-05-14 DIAGNOSIS — Z79899 Other long term (current) drug therapy: Secondary | ICD-10-CM | POA: Diagnosis not present

## 2015-05-14 DIAGNOSIS — Z885 Allergy status to narcotic agent status: Secondary | ICD-10-CM | POA: Diagnosis not present

## 2015-05-14 DIAGNOSIS — Z7982 Long term (current) use of aspirin: Secondary | ICD-10-CM | POA: Diagnosis not present

## 2015-05-14 DIAGNOSIS — R270 Ataxia, unspecified: Secondary | ICD-10-CM | POA: Diagnosis present

## 2015-05-14 DIAGNOSIS — G8311 Monoplegia of lower limb affecting right dominant side: Secondary | ICD-10-CM | POA: Diagnosis present

## 2015-05-14 DIAGNOSIS — I63442 Cerebral infarction due to embolism of left cerebellar artery: Secondary | ICD-10-CM | POA: Diagnosis not present

## 2015-05-14 DIAGNOSIS — Z955 Presence of coronary angioplasty implant and graft: Secondary | ICD-10-CM | POA: Diagnosis not present

## 2015-05-14 DIAGNOSIS — Z8673 Personal history of transient ischemic attack (TIA), and cerebral infarction without residual deficits: Secondary | ICD-10-CM | POA: Diagnosis not present

## 2015-05-14 DIAGNOSIS — I69393 Ataxia following cerebral infarction: Secondary | ICD-10-CM | POA: Diagnosis not present

## 2015-05-14 DIAGNOSIS — J449 Chronic obstructive pulmonary disease, unspecified: Secondary | ICD-10-CM | POA: Diagnosis present

## 2015-05-14 DIAGNOSIS — F17218 Nicotine dependence, cigarettes, with other nicotine-induced disorders: Secondary | ICD-10-CM | POA: Diagnosis not present

## 2015-05-14 DIAGNOSIS — E1165 Type 2 diabetes mellitus with hyperglycemia: Secondary | ICD-10-CM | POA: Diagnosis present

## 2015-05-14 DIAGNOSIS — M810 Age-related osteoporosis without current pathological fracture: Secondary | ICD-10-CM | POA: Diagnosis present

## 2015-05-14 DIAGNOSIS — F411 Generalized anxiety disorder: Secondary | ICD-10-CM | POA: Diagnosis not present

## 2015-05-14 DIAGNOSIS — G40109 Localization-related (focal) (partial) symptomatic epilepsy and epileptic syndromes with simple partial seizures, not intractable, without status epilepticus: Secondary | ICD-10-CM | POA: Diagnosis not present

## 2015-05-14 DIAGNOSIS — F329 Major depressive disorder, single episode, unspecified: Secondary | ICD-10-CM | POA: Diagnosis not present

## 2015-05-14 DIAGNOSIS — R51 Headache: Secondary | ICD-10-CM | POA: Diagnosis not present

## 2015-05-14 DIAGNOSIS — R54 Age-related physical debility: Secondary | ICD-10-CM | POA: Diagnosis not present

## 2015-05-17 DIAGNOSIS — I63442 Cerebral infarction due to embolism of left cerebellar artery: Secondary | ICD-10-CM | POA: Diagnosis not present

## 2015-05-17 DIAGNOSIS — F17210 Nicotine dependence, cigarettes, uncomplicated: Secondary | ICD-10-CM | POA: Diagnosis present

## 2015-05-17 DIAGNOSIS — Z9111 Patient's noncompliance with dietary regimen: Secondary | ICD-10-CM | POA: Diagnosis not present

## 2015-05-17 DIAGNOSIS — I69351 Hemiplegia and hemiparesis following cerebral infarction affecting right dominant side: Secondary | ICD-10-CM | POA: Diagnosis not present

## 2015-05-17 DIAGNOSIS — Z7984 Long term (current) use of oral hypoglycemic drugs: Secondary | ICD-10-CM | POA: Diagnosis not present

## 2015-05-17 DIAGNOSIS — E119 Type 2 diabetes mellitus without complications: Secondary | ICD-10-CM | POA: Diagnosis present

## 2015-05-17 DIAGNOSIS — E039 Hypothyroidism, unspecified: Secondary | ICD-10-CM | POA: Diagnosis present

## 2015-05-17 DIAGNOSIS — J449 Chronic obstructive pulmonary disease, unspecified: Secondary | ICD-10-CM | POA: Diagnosis present

## 2015-05-17 DIAGNOSIS — J45909 Unspecified asthma, uncomplicated: Secondary | ICD-10-CM | POA: Diagnosis present

## 2015-05-17 DIAGNOSIS — M1990 Unspecified osteoarthritis, unspecified site: Secondary | ICD-10-CM | POA: Diagnosis present

## 2015-05-17 DIAGNOSIS — I10 Essential (primary) hypertension: Secondary | ICD-10-CM | POA: Diagnosis present

## 2015-05-17 DIAGNOSIS — R197 Diarrhea, unspecified: Secondary | ICD-10-CM | POA: Diagnosis not present

## 2015-05-17 DIAGNOSIS — I69393 Ataxia following cerebral infarction: Secondary | ICD-10-CM | POA: Diagnosis not present

## 2015-05-17 DIAGNOSIS — F419 Anxiety disorder, unspecified: Secondary | ICD-10-CM | POA: Diagnosis present

## 2015-05-17 DIAGNOSIS — R51 Headache: Secondary | ICD-10-CM | POA: Diagnosis present

## 2015-05-17 DIAGNOSIS — I2510 Atherosclerotic heart disease of native coronary artery without angina pectoris: Secondary | ICD-10-CM | POA: Diagnosis present

## 2015-05-17 DIAGNOSIS — M810 Age-related osteoporosis without current pathological fracture: Secondary | ICD-10-CM | POA: Diagnosis present

## 2015-05-17 DIAGNOSIS — E7800 Pure hypercholesterolemia, unspecified: Secondary | ICD-10-CM | POA: Diagnosis present

## 2015-05-17 DIAGNOSIS — M545 Low back pain: Secondary | ICD-10-CM | POA: Diagnosis present

## 2015-05-17 DIAGNOSIS — G40909 Epilepsy, unspecified, not intractable, without status epilepticus: Secondary | ICD-10-CM | POA: Diagnosis present

## 2015-05-17 DIAGNOSIS — Z955 Presence of coronary angioplasty implant and graft: Secondary | ICD-10-CM | POA: Diagnosis not present

## 2015-05-17 DIAGNOSIS — Z885 Allergy status to narcotic agent status: Secondary | ICD-10-CM | POA: Diagnosis not present

## 2015-05-29 DIAGNOSIS — I119 Hypertensive heart disease without heart failure: Secondary | ICD-10-CM | POA: Diagnosis not present

## 2015-05-29 DIAGNOSIS — I69393 Ataxia following cerebral infarction: Secondary | ICD-10-CM | POA: Diagnosis not present

## 2015-05-29 DIAGNOSIS — J449 Chronic obstructive pulmonary disease, unspecified: Secondary | ICD-10-CM | POA: Diagnosis not present

## 2015-05-29 DIAGNOSIS — G40909 Epilepsy, unspecified, not intractable, without status epilepticus: Secondary | ICD-10-CM | POA: Diagnosis not present

## 2015-05-29 DIAGNOSIS — M545 Low back pain: Secondary | ICD-10-CM | POA: Diagnosis not present

## 2015-05-29 DIAGNOSIS — E119 Type 2 diabetes mellitus without complications: Secondary | ICD-10-CM | POA: Diagnosis not present

## 2015-05-29 DIAGNOSIS — I69351 Hemiplegia and hemiparesis following cerebral infarction affecting right dominant side: Secondary | ICD-10-CM | POA: Diagnosis not present

## 2015-05-29 DIAGNOSIS — J45909 Unspecified asthma, uncomplicated: Secondary | ICD-10-CM | POA: Diagnosis not present

## 2015-05-29 DIAGNOSIS — E785 Hyperlipidemia, unspecified: Secondary | ICD-10-CM | POA: Diagnosis not present

## 2015-05-29 DIAGNOSIS — Z7984 Long term (current) use of oral hypoglycemic drugs: Secondary | ICD-10-CM | POA: Diagnosis not present

## 2015-05-29 DIAGNOSIS — M150 Primary generalized (osteo)arthritis: Secondary | ICD-10-CM | POA: Diagnosis not present

## 2015-05-29 DIAGNOSIS — Z720 Tobacco use: Secondary | ICD-10-CM | POA: Diagnosis not present

## 2015-05-31 DIAGNOSIS — J449 Chronic obstructive pulmonary disease, unspecified: Secondary | ICD-10-CM | POA: Diagnosis not present

## 2015-05-31 DIAGNOSIS — I119 Hypertensive heart disease without heart failure: Secondary | ICD-10-CM | POA: Diagnosis not present

## 2015-05-31 DIAGNOSIS — E119 Type 2 diabetes mellitus without complications: Secondary | ICD-10-CM | POA: Diagnosis not present

## 2015-05-31 DIAGNOSIS — I69351 Hemiplegia and hemiparesis following cerebral infarction affecting right dominant side: Secondary | ICD-10-CM | POA: Diagnosis not present

## 2015-05-31 DIAGNOSIS — I69393 Ataxia following cerebral infarction: Secondary | ICD-10-CM | POA: Diagnosis not present

## 2015-05-31 DIAGNOSIS — G40909 Epilepsy, unspecified, not intractable, without status epilepticus: Secondary | ICD-10-CM | POA: Diagnosis not present

## 2015-06-01 DIAGNOSIS — Z79899 Other long term (current) drug therapy: Secondary | ICD-10-CM | POA: Diagnosis not present

## 2015-06-01 DIAGNOSIS — M0609 Rheumatoid arthritis without rheumatoid factor, multiple sites: Secondary | ICD-10-CM | POA: Diagnosis not present

## 2015-06-05 DIAGNOSIS — G40909 Epilepsy, unspecified, not intractable, without status epilepticus: Secondary | ICD-10-CM | POA: Diagnosis not present

## 2015-06-05 DIAGNOSIS — I69351 Hemiplegia and hemiparesis following cerebral infarction affecting right dominant side: Secondary | ICD-10-CM | POA: Diagnosis not present

## 2015-06-05 DIAGNOSIS — I119 Hypertensive heart disease without heart failure: Secondary | ICD-10-CM | POA: Diagnosis not present

## 2015-06-05 DIAGNOSIS — I69393 Ataxia following cerebral infarction: Secondary | ICD-10-CM | POA: Diagnosis not present

## 2015-06-05 DIAGNOSIS — E119 Type 2 diabetes mellitus without complications: Secondary | ICD-10-CM | POA: Diagnosis not present

## 2015-06-05 DIAGNOSIS — J449 Chronic obstructive pulmonary disease, unspecified: Secondary | ICD-10-CM | POA: Diagnosis not present

## 2015-06-07 DIAGNOSIS — J449 Chronic obstructive pulmonary disease, unspecified: Secondary | ICD-10-CM | POA: Diagnosis not present

## 2015-06-07 DIAGNOSIS — I69351 Hemiplegia and hemiparesis following cerebral infarction affecting right dominant side: Secondary | ICD-10-CM | POA: Diagnosis not present

## 2015-06-07 DIAGNOSIS — E119 Type 2 diabetes mellitus without complications: Secondary | ICD-10-CM | POA: Diagnosis not present

## 2015-06-07 DIAGNOSIS — I69393 Ataxia following cerebral infarction: Secondary | ICD-10-CM | POA: Diagnosis not present

## 2015-06-07 DIAGNOSIS — I119 Hypertensive heart disease without heart failure: Secondary | ICD-10-CM | POA: Diagnosis not present

## 2015-06-07 DIAGNOSIS — G40909 Epilepsy, unspecified, not intractable, without status epilepticus: Secondary | ICD-10-CM | POA: Diagnosis not present

## 2015-06-08 DIAGNOSIS — J449 Chronic obstructive pulmonary disease, unspecified: Secondary | ICD-10-CM | POA: Diagnosis not present

## 2015-06-08 DIAGNOSIS — I69351 Hemiplegia and hemiparesis following cerebral infarction affecting right dominant side: Secondary | ICD-10-CM | POA: Diagnosis not present

## 2015-06-08 DIAGNOSIS — I119 Hypertensive heart disease without heart failure: Secondary | ICD-10-CM | POA: Diagnosis not present

## 2015-06-08 DIAGNOSIS — I69393 Ataxia following cerebral infarction: Secondary | ICD-10-CM | POA: Diagnosis not present

## 2015-06-08 DIAGNOSIS — G40909 Epilepsy, unspecified, not intractable, without status epilepticus: Secondary | ICD-10-CM | POA: Diagnosis not present

## 2015-06-08 DIAGNOSIS — E119 Type 2 diabetes mellitus without complications: Secondary | ICD-10-CM | POA: Diagnosis not present

## 2015-06-11 DIAGNOSIS — I69351 Hemiplegia and hemiparesis following cerebral infarction affecting right dominant side: Secondary | ICD-10-CM | POA: Diagnosis not present

## 2015-06-11 DIAGNOSIS — E119 Type 2 diabetes mellitus without complications: Secondary | ICD-10-CM | POA: Diagnosis not present

## 2015-06-11 DIAGNOSIS — I69393 Ataxia following cerebral infarction: Secondary | ICD-10-CM | POA: Diagnosis not present

## 2015-06-11 DIAGNOSIS — G40909 Epilepsy, unspecified, not intractable, without status epilepticus: Secondary | ICD-10-CM | POA: Diagnosis not present

## 2015-06-11 DIAGNOSIS — J449 Chronic obstructive pulmonary disease, unspecified: Secondary | ICD-10-CM | POA: Diagnosis not present

## 2015-06-11 DIAGNOSIS — I119 Hypertensive heart disease without heart failure: Secondary | ICD-10-CM | POA: Diagnosis not present

## 2015-06-12 DIAGNOSIS — I10 Essential (primary) hypertension: Secondary | ICD-10-CM | POA: Diagnosis not present

## 2015-06-12 DIAGNOSIS — F518 Other sleep disorders not due to a substance or known physiological condition: Secondary | ICD-10-CM | POA: Diagnosis not present

## 2015-06-12 DIAGNOSIS — R569 Unspecified convulsions: Secondary | ICD-10-CM | POA: Diagnosis not present

## 2015-06-12 DIAGNOSIS — I2510 Atherosclerotic heart disease of native coronary artery without angina pectoris: Secondary | ICD-10-CM | POA: Diagnosis not present

## 2015-06-12 DIAGNOSIS — Z8673 Personal history of transient ischemic attack (TIA), and cerebral infarction without residual deficits: Secondary | ICD-10-CM | POA: Diagnosis not present

## 2015-06-12 DIAGNOSIS — M5431 Sciatica, right side: Secondary | ICD-10-CM | POA: Diagnosis not present

## 2015-06-12 DIAGNOSIS — I639 Cerebral infarction, unspecified: Secondary | ICD-10-CM | POA: Diagnosis not present

## 2015-06-13 DIAGNOSIS — I69393 Ataxia following cerebral infarction: Secondary | ICD-10-CM | POA: Diagnosis not present

## 2015-06-13 DIAGNOSIS — E119 Type 2 diabetes mellitus without complications: Secondary | ICD-10-CM | POA: Diagnosis not present

## 2015-06-13 DIAGNOSIS — G40909 Epilepsy, unspecified, not intractable, without status epilepticus: Secondary | ICD-10-CM | POA: Diagnosis not present

## 2015-06-13 DIAGNOSIS — J449 Chronic obstructive pulmonary disease, unspecified: Secondary | ICD-10-CM | POA: Diagnosis not present

## 2015-06-13 DIAGNOSIS — I69351 Hemiplegia and hemiparesis following cerebral infarction affecting right dominant side: Secondary | ICD-10-CM | POA: Diagnosis not present

## 2015-06-13 DIAGNOSIS — I119 Hypertensive heart disease without heart failure: Secondary | ICD-10-CM | POA: Diagnosis not present

## 2015-06-15 DIAGNOSIS — I69351 Hemiplegia and hemiparesis following cerebral infarction affecting right dominant side: Secondary | ICD-10-CM | POA: Diagnosis not present

## 2015-06-15 DIAGNOSIS — I119 Hypertensive heart disease without heart failure: Secondary | ICD-10-CM | POA: Diagnosis not present

## 2015-06-15 DIAGNOSIS — I69393 Ataxia following cerebral infarction: Secondary | ICD-10-CM | POA: Diagnosis not present

## 2015-06-15 DIAGNOSIS — E119 Type 2 diabetes mellitus without complications: Secondary | ICD-10-CM | POA: Diagnosis not present

## 2015-06-15 DIAGNOSIS — J449 Chronic obstructive pulmonary disease, unspecified: Secondary | ICD-10-CM | POA: Diagnosis not present

## 2015-06-15 DIAGNOSIS — G40909 Epilepsy, unspecified, not intractable, without status epilepticus: Secondary | ICD-10-CM | POA: Diagnosis not present

## 2015-06-18 DIAGNOSIS — I69393 Ataxia following cerebral infarction: Secondary | ICD-10-CM | POA: Diagnosis not present

## 2015-06-18 DIAGNOSIS — E119 Type 2 diabetes mellitus without complications: Secondary | ICD-10-CM | POA: Diagnosis not present

## 2015-06-18 DIAGNOSIS — G40909 Epilepsy, unspecified, not intractable, without status epilepticus: Secondary | ICD-10-CM | POA: Diagnosis not present

## 2015-06-18 DIAGNOSIS — J449 Chronic obstructive pulmonary disease, unspecified: Secondary | ICD-10-CM | POA: Diagnosis not present

## 2015-06-18 DIAGNOSIS — I69351 Hemiplegia and hemiparesis following cerebral infarction affecting right dominant side: Secondary | ICD-10-CM | POA: Diagnosis not present

## 2015-06-18 DIAGNOSIS — I119 Hypertensive heart disease without heart failure: Secondary | ICD-10-CM | POA: Diagnosis not present

## 2015-06-19 DIAGNOSIS — M7989 Other specified soft tissue disorders: Secondary | ICD-10-CM | POA: Diagnosis not present

## 2015-06-19 DIAGNOSIS — M79672 Pain in left foot: Secondary | ICD-10-CM | POA: Diagnosis not present

## 2015-06-25 DIAGNOSIS — R609 Edema, unspecified: Secondary | ICD-10-CM | POA: Diagnosis not present

## 2015-06-25 DIAGNOSIS — R600 Localized edema: Secondary | ICD-10-CM | POA: Diagnosis not present

## 2015-06-25 DIAGNOSIS — M9272 Juvenile osteochondrosis of metatarsus, left foot: Secondary | ICD-10-CM | POA: Diagnosis not present

## 2015-06-26 DIAGNOSIS — B351 Tinea unguium: Secondary | ICD-10-CM | POA: Diagnosis not present

## 2015-06-26 DIAGNOSIS — E1149 Type 2 diabetes mellitus with other diabetic neurological complication: Secondary | ICD-10-CM | POA: Diagnosis not present

## 2015-06-26 DIAGNOSIS — L84 Corns and callosities: Secondary | ICD-10-CM | POA: Diagnosis not present

## 2015-07-04 DIAGNOSIS — J45909 Unspecified asthma, uncomplicated: Secondary | ICD-10-CM | POA: Diagnosis not present

## 2015-07-04 DIAGNOSIS — Z8249 Family history of ischemic heart disease and other diseases of the circulatory system: Secondary | ICD-10-CM | POA: Diagnosis not present

## 2015-07-04 DIAGNOSIS — Z8262 Family history of osteoporosis: Secondary | ICD-10-CM | POA: Diagnosis not present

## 2015-07-04 DIAGNOSIS — G40909 Epilepsy, unspecified, not intractable, without status epilepticus: Secondary | ICD-10-CM | POA: Diagnosis not present

## 2015-07-04 DIAGNOSIS — M810 Age-related osteoporosis without current pathological fracture: Secondary | ICD-10-CM | POA: Diagnosis not present

## 2015-07-04 DIAGNOSIS — F17210 Nicotine dependence, cigarettes, uncomplicated: Secondary | ICD-10-CM | POA: Diagnosis not present

## 2015-07-04 DIAGNOSIS — Z955 Presence of coronary angioplasty implant and graft: Secondary | ICD-10-CM | POA: Diagnosis not present

## 2015-07-04 DIAGNOSIS — I10 Essential (primary) hypertension: Secondary | ICD-10-CM | POA: Diagnosis not present

## 2015-07-04 DIAGNOSIS — F4323 Adjustment disorder with mixed anxiety and depressed mood: Secondary | ICD-10-CM | POA: Diagnosis not present

## 2015-07-04 DIAGNOSIS — F329 Major depressive disorder, single episode, unspecified: Secondary | ICD-10-CM | POA: Diagnosis not present

## 2015-07-04 DIAGNOSIS — Z590 Homelessness: Secondary | ICD-10-CM | POA: Diagnosis not present

## 2015-07-04 DIAGNOSIS — E119 Type 2 diabetes mellitus without complications: Secondary | ICD-10-CM | POA: Diagnosis not present

## 2015-07-04 DIAGNOSIS — Z720 Tobacco use: Secondary | ICD-10-CM | POA: Diagnosis not present

## 2015-07-04 DIAGNOSIS — Z8673 Personal history of transient ischemic attack (TIA), and cerebral infarction without residual deficits: Secondary | ICD-10-CM | POA: Diagnosis not present

## 2015-07-04 DIAGNOSIS — E7800 Pure hypercholesterolemia, unspecified: Secondary | ICD-10-CM | POA: Diagnosis not present

## 2015-07-04 DIAGNOSIS — Z885 Allergy status to narcotic agent status: Secondary | ICD-10-CM | POA: Diagnosis not present

## 2015-07-04 DIAGNOSIS — D649 Anemia, unspecified: Secondary | ICD-10-CM | POA: Diagnosis not present

## 2015-07-04 DIAGNOSIS — Z604 Social exclusion and rejection: Secondary | ICD-10-CM | POA: Diagnosis not present

## 2015-07-04 DIAGNOSIS — J449 Chronic obstructive pulmonary disease, unspecified: Secondary | ICD-10-CM | POA: Diagnosis not present

## 2015-08-16 ENCOUNTER — Emergency Department (HOSPITAL_COMMUNITY): Payer: Medicare Other

## 2015-08-16 ENCOUNTER — Encounter (HOSPITAL_COMMUNITY): Payer: Self-pay | Admitting: Emergency Medicine

## 2015-08-16 ENCOUNTER — Observation Stay (HOSPITAL_COMMUNITY)
Admission: EM | Admit: 2015-08-16 | Discharge: 2015-08-19 | Disposition: A | Discharge status: Home / Self Care | Payer: Medicare Other | Attending: Internal Medicine | Admitting: Internal Medicine

## 2015-08-16 DIAGNOSIS — J449 Chronic obstructive pulmonary disease, unspecified: Secondary | ICD-10-CM

## 2015-08-16 DIAGNOSIS — Z720 Tobacco use: Secondary | ICD-10-CM

## 2015-08-16 DIAGNOSIS — F17210 Nicotine dependence, cigarettes, uncomplicated: Secondary | ICD-10-CM | POA: Diagnosis not present

## 2015-08-16 DIAGNOSIS — I69392 Facial weakness following cerebral infarction: Secondary | ICD-10-CM | POA: Insufficient documentation

## 2015-08-16 DIAGNOSIS — I69351 Hemiplegia and hemiparesis following cerebral infarction affecting right dominant side: Principal | ICD-10-CM | POA: Insufficient documentation

## 2015-08-16 DIAGNOSIS — Z66 Do not resuscitate: Secondary | ICD-10-CM | POA: Insufficient documentation

## 2015-08-16 DIAGNOSIS — M6281 Muscle weakness (generalized): Secondary | ICD-10-CM | POA: Diagnosis not present

## 2015-08-16 DIAGNOSIS — F329 Major depressive disorder, single episode, unspecified: Secondary | ICD-10-CM | POA: Diagnosis not present

## 2015-08-16 DIAGNOSIS — I5032 Chronic diastolic (congestive) heart failure: Secondary | ICD-10-CM | POA: Diagnosis not present

## 2015-08-16 DIAGNOSIS — R262 Difficulty in walking, not elsewhere classified: Secondary | ICD-10-CM | POA: Insufficient documentation

## 2015-08-16 DIAGNOSIS — G40909 Epilepsy, unspecified, not intractable, without status epilepticus: Secondary | ICD-10-CM

## 2015-08-16 DIAGNOSIS — M6289 Other specified disorders of muscle: Secondary | ICD-10-CM | POA: Diagnosis not present

## 2015-08-16 DIAGNOSIS — R269 Unspecified abnormalities of gait and mobility: Secondary | ICD-10-CM | POA: Insufficient documentation

## 2015-08-16 DIAGNOSIS — Z85828 Personal history of other malignant neoplasm of skin: Secondary | ICD-10-CM | POA: Insufficient documentation

## 2015-08-16 DIAGNOSIS — E118 Type 2 diabetes mellitus with unspecified complications: Secondary | ICD-10-CM | POA: Diagnosis not present

## 2015-08-16 DIAGNOSIS — E1140 Type 2 diabetes mellitus with diabetic neuropathy, unspecified: Secondary | ICD-10-CM

## 2015-08-16 DIAGNOSIS — R569 Unspecified convulsions: Secondary | ICD-10-CM | POA: Diagnosis not present

## 2015-08-16 DIAGNOSIS — M069 Rheumatoid arthritis, unspecified: Secondary | ICD-10-CM | POA: Diagnosis not present

## 2015-08-16 DIAGNOSIS — G459 Transient cerebral ischemic attack, unspecified: Secondary | ICD-10-CM | POA: Diagnosis not present

## 2015-08-16 DIAGNOSIS — E785 Hyperlipidemia, unspecified: Secondary | ICD-10-CM | POA: Diagnosis not present

## 2015-08-16 DIAGNOSIS — E038 Other specified hypothyroidism: Secondary | ICD-10-CM

## 2015-08-16 DIAGNOSIS — J42 Unspecified chronic bronchitis: Secondary | ICD-10-CM

## 2015-08-16 DIAGNOSIS — I252 Old myocardial infarction: Secondary | ICD-10-CM | POA: Insufficient documentation

## 2015-08-16 DIAGNOSIS — E1169 Type 2 diabetes mellitus with other specified complication: Secondary | ICD-10-CM

## 2015-08-16 DIAGNOSIS — Z8679 Personal history of other diseases of the circulatory system: Secondary | ICD-10-CM

## 2015-08-16 DIAGNOSIS — E119 Type 2 diabetes mellitus without complications: Secondary | ICD-10-CM | POA: Diagnosis not present

## 2015-08-16 DIAGNOSIS — E039 Hypothyroidism, unspecified: Secondary | ICD-10-CM | POA: Diagnosis not present

## 2015-08-16 DIAGNOSIS — R531 Weakness: Secondary | ICD-10-CM

## 2015-08-16 DIAGNOSIS — I6789 Other cerebrovascular disease: Secondary | ICD-10-CM | POA: Diagnosis not present

## 2015-08-16 DIAGNOSIS — E876 Hypokalemia: Secondary | ICD-10-CM | POA: Insufficient documentation

## 2015-08-16 DIAGNOSIS — I2510 Atherosclerotic heart disease of native coronary artery without angina pectoris: Secondary | ICD-10-CM | POA: Insufficient documentation

## 2015-08-16 DIAGNOSIS — R51 Headache: Secondary | ICD-10-CM | POA: Diagnosis not present

## 2015-08-16 HISTORY — DX: Tremor, unspecified: R25.1

## 2015-08-16 HISTORY — DX: Unspecified convulsions: R56.9

## 2015-08-16 HISTORY — DX: Type 2 diabetes mellitus without complications: E11.9

## 2015-08-16 HISTORY — DX: Essential (primary) hypertension: I10

## 2015-08-16 HISTORY — DX: Heart failure, unspecified: I50.9

## 2015-08-16 HISTORY — DX: Cerebral infarction, unspecified: I63.9

## 2015-08-16 HISTORY — DX: Hyperlipidemia, unspecified: E78.5

## 2015-08-16 HISTORY — DX: Unspecified osteoarthritis, unspecified site: M19.90

## 2015-08-16 HISTORY — DX: Chronic obstructive pulmonary disease, unspecified: J44.9

## 2015-08-16 HISTORY — DX: Unspecified asthma, uncomplicated: J45.909

## 2015-08-16 HISTORY — DX: Unspecified malignant neoplasm of skin, unspecified: C44.90

## 2015-08-16 HISTORY — DX: Atherosclerotic heart disease of native coronary artery without angina pectoris: I25.10

## 2015-08-16 HISTORY — DX: Malignant (primary) neoplasm, unspecified: C80.1

## 2015-08-16 LAB — COMPREHENSIVE METABOLIC PANEL
ALT: 18 U/L (ref 14–54)
ANION GAP: 8 (ref 5–15)
AST: 31 U/L (ref 15–41)
Albumin: 3.5 g/dL (ref 3.5–5.0)
Alkaline Phosphatase: 87 U/L (ref 38–126)
BUN: 9 mg/dL (ref 6–20)
CHLORIDE: 108 mmol/L (ref 101–111)
CO2: 23 mmol/L (ref 22–32)
Calcium: 8.8 mg/dL — ABNORMAL LOW (ref 8.9–10.3)
Creatinine, Ser: 0.78 mg/dL (ref 0.44–1.00)
Glucose, Bld: 178 mg/dL — ABNORMAL HIGH (ref 65–99)
POTASSIUM: 3.3 mmol/L — AB (ref 3.5–5.1)
Sodium: 139 mmol/L (ref 135–145)
Total Bilirubin: 0.1 mg/dL — ABNORMAL LOW (ref 0.3–1.2)
Total Protein: 6.4 g/dL — ABNORMAL LOW (ref 6.5–8.1)

## 2015-08-16 LAB — PROTIME-INR
INR: 1.05
Prothrombin Time: 13.7 seconds (ref 11.4–15.2)

## 2015-08-16 LAB — URINE MICROSCOPIC-ADD ON: RBC / HPF: NONE SEEN RBC/hpf (ref 0–5)

## 2015-08-16 LAB — GLUCOSE, CAPILLARY
GLUCOSE-CAPILLARY: 167 mg/dL — AB (ref 65–99)
Glucose-Capillary: 157 mg/dL — ABNORMAL HIGH (ref 65–99)

## 2015-08-16 LAB — DIFFERENTIAL
BASOS ABS: 0.1 10*3/uL (ref 0.0–0.1)
BASOS PCT: 1 %
EOS ABS: 0.1 10*3/uL (ref 0.0–0.7)
Eosinophils Relative: 2 %
Lymphocytes Relative: 41 %
Lymphs Abs: 2.4 10*3/uL (ref 0.7–4.0)
MONO ABS: 0.5 10*3/uL (ref 0.1–1.0)
MONOS PCT: 9 %
Neutro Abs: 2.7 10*3/uL (ref 1.7–7.7)
Neutrophils Relative %: 47 %

## 2015-08-16 LAB — I-STAT CHEM 8, ED
BUN: 9 mg/dL (ref 6–20)
CALCIUM ION: 1.07 mmol/L — AB (ref 1.12–1.23)
Chloride: 105 mmol/L (ref 101–111)
Creatinine, Ser: 0.7 mg/dL (ref 0.44–1.00)
GLUCOSE: 176 mg/dL — AB (ref 65–99)
HCT: 33 % — ABNORMAL LOW (ref 36.0–46.0)
HEMOGLOBIN: 11.2 g/dL — AB (ref 12.0–15.0)
Potassium: 3.3 mmol/L — ABNORMAL LOW (ref 3.5–5.1)
Sodium: 142 mmol/L (ref 135–145)
TCO2: 22 mmol/L (ref 0–100)

## 2015-08-16 LAB — CBC
HEMATOCRIT: 34.1 % — AB (ref 36.0–46.0)
Hemoglobin: 10.4 g/dL — ABNORMAL LOW (ref 12.0–15.0)
MCH: 24.8 pg — ABNORMAL LOW (ref 26.0–34.0)
MCHC: 30.5 g/dL (ref 30.0–36.0)
MCV: 81.2 fL (ref 78.0–100.0)
Platelets: 220 10*3/uL (ref 150–400)
RBC: 4.2 MIL/uL (ref 3.87–5.11)
RDW: 19.7 % — AB (ref 11.5–15.5)
WBC: 5.8 10*3/uL (ref 4.0–10.5)

## 2015-08-16 LAB — URINALYSIS, ROUTINE W REFLEX MICROSCOPIC
BILIRUBIN URINE: NEGATIVE
GLUCOSE, UA: 100 mg/dL — AB
HGB URINE DIPSTICK: NEGATIVE
Ketones, ur: NEGATIVE mg/dL
Nitrite: NEGATIVE
Protein, ur: NEGATIVE mg/dL
SPECIFIC GRAVITY, URINE: 1.016 (ref 1.005–1.030)
pH: 6.5 (ref 5.0–8.0)

## 2015-08-16 LAB — CBG MONITORING, ED: Glucose-Capillary: 170 mg/dL — ABNORMAL HIGH (ref 65–99)

## 2015-08-16 LAB — I-STAT TROPONIN, ED: TROPONIN I, POC: 0 ng/mL (ref 0.00–0.08)

## 2015-08-16 LAB — TSH: TSH: 43.28 u[IU]/mL — AB (ref 0.350–4.500)

## 2015-08-16 LAB — APTT: APTT: 28 s (ref 24–36)

## 2015-08-16 MED ORDER — DULOXETINE HCL 60 MG PO CPEP
60.0000 mg | ORAL_CAPSULE | Freq: Every day | ORAL | Status: DC
  Administered 2015-08-16 – 2015-08-18 (×3): 60 mg via ORAL
  Filled 2015-08-16 (×3): qty 1

## 2015-08-16 MED ORDER — FLUTICASONE FUROATE-VILANTEROL 100-25 MCG/INH IN AEPB
1.0000 | INHALATION_SPRAY | Freq: Every day | RESPIRATORY_TRACT | Status: DC
  Administered 2015-08-19: 1 via RESPIRATORY_TRACT
  Filled 2015-08-16 (×2): qty 28

## 2015-08-16 MED ORDER — ASPIRIN-DIPYRIDAMOLE ER 25-200 MG PO CP12
1.0000 | ORAL_CAPSULE | Freq: Two times a day (BID) | ORAL | Status: DC
  Administered 2015-08-16 – 2015-08-19 (×6): 1 via ORAL
  Filled 2015-08-16 (×6): qty 1

## 2015-08-16 MED ORDER — ENOXAPARIN SODIUM 40 MG/0.4ML ~~LOC~~ SOLN
40.0000 mg | SUBCUTANEOUS | Status: DC
  Administered 2015-08-16 – 2015-08-18 (×3): 40 mg via SUBCUTANEOUS
  Filled 2015-08-16 (×3): qty 0.4

## 2015-08-16 MED ORDER — LEVOTHYROXINE SODIUM 25 MCG PO TABS
125.0000 ug | ORAL_TABLET | ORAL | Status: DC
  Administered 2015-08-17: 125 ug via ORAL
  Filled 2015-08-16: qty 1

## 2015-08-16 MED ORDER — ASPIRIN 81 MG PO CHEW
324.0000 mg | CHEWABLE_TABLET | Freq: Once | ORAL | Status: AC
  Administered 2015-08-16: 324 mg via ORAL
  Filled 2015-08-16: qty 4

## 2015-08-16 MED ORDER — GABAPENTIN 400 MG PO CAPS
800.0000 mg | ORAL_CAPSULE | Freq: Two times a day (BID) | ORAL | Status: DC
  Administered 2015-08-16 – 2015-08-19 (×6): 800 mg via ORAL
  Filled 2015-08-16 (×6): qty 2

## 2015-08-16 MED ORDER — LEVETIRACETAM 500 MG PO TABS
500.0000 mg | ORAL_TABLET | Freq: Two times a day (BID) | ORAL | Status: DC
  Administered 2015-08-16 – 2015-08-19 (×6): 500 mg via ORAL
  Filled 2015-08-16 (×6): qty 1

## 2015-08-16 MED ORDER — FLUTICASONE FUROATE-VILANTEROL 100-25 MCG/INH IN AEPB: 1.0000 | INHALATION_SPRAY | Freq: Every day | RESPIRATORY_TRACT | Status: DC

## 2015-08-16 MED ORDER — SENNOSIDES-DOCUSATE SODIUM 8.6-50 MG PO TABS: 1.0000 | ORAL_TABLET | Freq: Every evening | ORAL | Status: DC | PRN

## 2015-08-16 MED ORDER — GABAPENTIN 800 MG PO TABS
800.0000 mg | ORAL_TABLET | Freq: Two times a day (BID) | ORAL | Status: DC
  Filled 2015-08-16: qty 1

## 2015-08-16 MED ORDER — ONDANSETRON HCL 4 MG/2ML IJ SOLN: 4.0000 mg | Freq: Four times a day (QID) | INTRAMUSCULAR | Status: DC | PRN

## 2015-08-16 MED ORDER — ROSUVASTATIN CALCIUM 5 MG PO TABS
40.0000 mg | ORAL_TABLET | Freq: Every day | ORAL | Status: DC
  Administered 2015-08-17 – 2015-08-19 (×3): 40 mg via ORAL
  Filled 2015-08-16 (×3): qty 8

## 2015-08-16 MED ORDER — UMECLIDINIUM BROMIDE 62.5 MCG/INH IN AEPB
1.0000 | INHALATION_SPRAY | Freq: Every day | RESPIRATORY_TRACT | Status: DC
  Administered 2015-08-17 – 2015-08-19 (×3): 1 via RESPIRATORY_TRACT
  Filled 2015-08-16: qty 7

## 2015-08-16 MED ORDER — SODIUM CHLORIDE 0.9% FLUSH
3.0000 mL | Freq: Two times a day (BID) | INTRAVENOUS | Status: DC
  Administered 2015-08-16 – 2015-08-18 (×4): 3 mL via INTRAVENOUS

## 2015-08-16 MED ORDER — INSULIN ASPART 100 UNIT/ML ~~LOC~~ SOLN
0.0000 [IU] | Freq: Three times a day (TID) | SUBCUTANEOUS | Status: DC
  Administered 2015-08-17 – 2015-08-18 (×2): 1 [IU] via SUBCUTANEOUS
  Administered 2015-08-18: 2 [IU] via SUBCUTANEOUS
  Administered 2015-08-18 (×2): 1 [IU] via SUBCUTANEOUS
  Administered 2015-08-19: 2 [IU] via SUBCUTANEOUS
  Administered 2015-08-19: 1 [IU] via SUBCUTANEOUS
  Administered 2015-08-19: 2 [IU] via SUBCUTANEOUS

## 2015-08-16 MED ORDER — ONDANSETRON HCL 4 MG PO TABS: 4.0000 mg | ORAL_TABLET | Freq: Four times a day (QID) | ORAL | Status: DC | PRN

## 2015-08-16 NOTE — ED Triage Notes (Signed)
Code stroke canceled per Neurologist

## 2015-08-16 NOTE — Consult Note (Signed)
Requesting Physician: ED MD    Chief Complaint: Code stroke  History obtained from:  Patient     HPI:                                                                                                                                         Samantha Aguirre is an 70 y.o. female who states she recently had a stroke and was seen at [facility] where she had a stroke work up and stayed in the hospital for 4-5 days. We are unable to get the records from care everywhere. Today she was sitting in her chair when she felt increased right sided weakness and paresthesia but exactly like her previous symptoms. CT head showed no acute bleed.  Due to recent stroke will need to obtain her records from [place].    Date last known well: Today Time last known well: Time: 12:50 tPA Given: No: minimal symptoms  Past Medical History:  Diagnosis Date  . Arthritis   . Asthma   . Cancer ([place])   . CHF (congestive heart failure) ([place])   . COPD (chronic obstructive pulmonary disease) ([place])   . Coronary artery disease   . Diabetes mellitus without complication ([place])   . HLD (hyperlipidemia)   . Hypertension   . MI (myocardial infarction) ([place])   . Seizures ([place])   . Skin cancer   . Stroke ([place])   . Tremors of nervous system     Past Surgical History:  Procedure Laterality Date  . ABDOMINAL HYSTERECTOMY    . APPENDECTOMY    . CHOLECYSTECTOMY    . KNEE SURGERY     left  . ROTATOR CUFF REPAIR     right    Family History  Problem Relation Age of Onset  . Breast cancer Mother    Social History:  reports that she has been smoking Cigarettes.  She has been smoking about 1.00 pack per day. She uses smokeless tobacco. She reports that she does not drink alcohol or use drugs.  Allergies:  Allergies  Allergen Reactions  . Atenolol     "MD took me off of it bc it was doing something wrong"  . Morphine And Related     "stops my breathing"  . Percocet [Oxycodone-Acetaminophen]     Stomach pains   . Percodan [Oxycodone-Aspirin]     Stomach pains  . Verapamil     " MD told me this was screwing me up"    Medications:  No current facility-administered medications for this encounter.    No current outpatient prescriptions on file.     ROS:                                                                                                                                       History obtained from the patient  General ROS: positive for -fatigue Psychological ROS: negative for - behavioral disorder, hallucinations, memory difficulties, mood swings or suicidal ideation Ophthalmic ROS: negative for - blurry vision, double vision, eye pain or loss of vision ENT ROS: negative for - epistaxis, nasal discharge, oral lesions, sore throat, tinnitus or vertigo Allergy and Immunology ROS: negative for - hives or itchy/watery eyes Hematological and Lymphatic ROS: negative for - bleeding problems, bruising or swollen lymph nodes Endocrine ROS: negative for - galactorrhea, hair pattern changes, polydipsia/polyuria or temperature intolerance Respiratory ROS: positive for - cough, shortness of breath or wheezing Cardiovascular ROS: positive for - chest pain, dyspnea on exertion, Gastrointestinal ROS: negative for - abdominal pain, diarrhea, hematemesis, nausea/vomiting or stool incontinence Genito-Urinary ROS: negative for - dysuria, hematuria, incontinence or urinary frequency/urgency Musculoskeletal ROS: negative for - joint swelling or muscular weakness Neurological ROS: as noted in HPI Dermatological ROS: negative for rash and skin lesion changes  Neurologic Examination:                                                                                                      Blood pressure 153/71, pulse (!) 59, temperature 98.6 F (37 C), temperature source Oral, resp. rate 12,  height 5\' 1"  (1.549 m), weight 86.8 kg (191 lb 5.8 oz), SpO2 100 %.  HEENT-  Normocephalic, no lesions, without obvious abnormality.  Normal external eye and conjunctiva.  Normal TM's bilaterally.  Normal auditory canals and external ears. Normal external nose, mucus membranes and septum.  Normal pharynx. Cardiovascular- S1, S2 normal, pulses palpable throughout   Lungs- chest clear, no wheezing, rales, normal symmetric air entry Abdomen- abnormal findings:  distended Extremities- no edema Lymph-no adenopathy palpable Musculoskeletal-positive joint tenderness Skin-warm and dry, no hyperpigmentation, vitiligo, or suspicious lesions  Neurological Examination Mental Status: Alert, oriented, thought content appropriate.  Speech fluent without evidence of aphasia.  Able to follow 3 step commands without difficulty. Cranial Nerves: II:  Visual fields grossly normal, pupils equal, round, reactive to light and accommodation III,IV, VI: ptosis not present, extra-ocular motions intact bilaterally V,VII: smile symmetric, facial light touch sensation decreased on the right VIII: hearing normal bilaterally  IX,X: uvula rises symmetrically XI: bilateral shoulder shrug XII: midline tongue extension Motor: Right : Upper extremity   5/5    Left:     Upper extremity   5/5  Lower extremity   5/5     Lower extremity   5/5 Tone and bulk:normal tone throughout; no atrophy noted Sensory: Pinprick and light touch decreased on the right Deep Tendon Reflexes: 2+ and symmetric throughout UE and left LE but no KJ on the right. No AJ Plantars: Right: downgoing   Left: downgoing Cerebellar: normal finger-to-nose with positional and intention tremor and normal heel-to-shin test Gait: not tested       Lab Results: Basic Metabolic Panel:  Recent Labs Lab 08/16/15 1405 08/16/15 1410  NA 139 142  K 3.3* 3.3*  CL 108 105  CO2 23  --   GLUCOSE 178* 176*  BUN 9 9  CREATININE 0.78 0.70  CALCIUM 8.8*  --      Liver Function Tests:  Recent Labs Lab 08/16/15 1405  AST 31  ALT 18  ALKPHOS 87  BILITOT 0.1*  PROT 6.4*  ALBUMIN 3.5   No results for input(s): LIPASE, AMYLASE in the last 168 hours. No results for input(s): AMMONIA in the last 168 hours.  CBC:  Recent Labs Lab 08/16/15 1405 08/16/15 1410  WBC 5.8  --   NEUTROABS 2.7  --   HGB 10.4* 11.2*  HCT 34.1* 33.0*  MCV 81.2  --   PLT 220  --     Cardiac Enzymes: No results for input(s): CKTOTAL, CKMB, CKMBINDEX, TROPONINI in the last 168 hours.  Lipid Panel: No results for input(s): CHOL, TRIG, HDL, CHOLHDL, VLDL, LDLCALC in the last 168 hours.  CBG:  Recent Labs Lab 08/16/15 1404  GLUCAP 170*    Microbiology: No results found for this or any previous visit.  Coagulation Studies:  Recent Labs  08/16/15 1405  LABPROT 13.7  INR 1.05    Imaging: Ct Head Code Stroke Wo Contrast  Result Date: 08/16/2015 CLINICAL DATA:  Code stroke. Right-sided headache and right-sided weakness. Sudden onset earlier today at 1250 hours. EXAM: CT HEAD WITHOUT CONTRAST TECHNIQUE: Contiguous axial images were obtained from the base of the skull through the vertex without intravenous contrast. COMPARISON:  None. FINDINGS: No definite acute stroke, acute hemorrhage, hydrocephalus, or extra-axial fluid. Chronic microvascular ischemic change affects the periventricular and subcortical white matter, of a moderately advanced nature, unchanged from prior noncontrast CT of 05/14/2015, at [facility]. Chronic areas of infarction are seen in the LEFT cerebellum, as noted on previous studies. Stable RIGHT parietal and LEFT cavernous meningiomas, when compared with prior CTA. The extracranial soft tissues unremarkable. ASPECTS [facility] Stroke Program Early CT Score, http://www.aspectsinstroke.com) - Ganglionic level infarction (caudate, lentiform nuclei, internal capsule, insula, M1-M3 cortex): 7 - Supraganglionic infarction (M4-M6 cortex):  3 Total score (0-10 with 10 being normal): 10 IMPRESSION: 1. No acute intracranial findings.  Chronic changes as described. 2. ASPECTS score 10 These results were called by telephone at the time of interpretation on 08/16/2015 at 2:30 pm to Dr. Shon Hale , who verbally acknowledged these results. Electronically Signed   By: Staci Righter M.D.   On: 08/16/2015 14:37       Assessment and plan discussed with with attending physician and they are in agreement.    Etta Quill PA-C [facility] [phone]  08/16/2015, 3:00 PM   Assessment: 70 y.o. female with previous stroke in last few months including right sided weakness and decreased  strength. Today at 12:50 she had recrudescence of her symptoms.  Exam showed NIHSS 2 and symptoms not large enough for tPA.   Stroke Risk Factors - diabetes mellitus, hyperlipidemia, hypertension and smoking   Recommend: 1) MRI brain 2) Obtain records from [place]   Neurology Attending Addendum  This patient was seen, examined, and d/w PA. I have reviewed the note and agree with the findings, assessment and plan as documented with the following additions.   The patient was a CODE STROKE in the ED and I was present for the entire encounter. History and exam are as documented by PA; I performed the neurologic exam myself. The only pertinent findings were decreased sensation to light touch and pin on the right side with slight weakness of the right grip and absent right knee jerk.   I have personally and independently reviewed the [facility] without contrast from today. This shows areas of chronic infarction in both cerebellar hemispheres as well as in the left frontal lobe lateral to the basal ganglia. No obvious acute ischemia is seen. Incidental meningiomas are noted in the right parietal region and the left cavernous region.   Impression: As per PA note. Patient reports today's symptoms are identical to those she experienced with her recent stroke  several months ago. This would suggest recrudescence rather than an acute stroke. Will try to obtain records detailing her recent stroke admission and workup to see if any additional testing is required. Continue Aggrenox and rosuvastatin. Can hold on MRI for now given low suspicion of acute ischemia.

## 2015-08-16 NOTE — ED Notes (Signed)
Pt no longer has right sided facial droop

## 2015-08-16 NOTE — ED Notes (Signed)
  CBG 170

## 2015-08-16 NOTE — Code Documentation (Signed)
70yo female arriving to [facility] via [place] at 8.  Patient from home where she had right sided weakness and numbness.  Of note, patient with h/o stroke 4-5 months ago and was treated at [facility].  Stroke team at the bedside on patient arrival.  Labs drawn and patient to CT with team.  CT completed.  NIHSS 3, see documentation for details and code stroke times.  Patient with right sided decreased sensation and right leg weakness that is inconsistent on exam.  Dr. Shon Hale at the bedside.  No acute stroke treatment at this time.  Code stroke canceled.  Bedside handoff with ED RN Hope.

## 2015-08-16 NOTE — ED Provider Notes (Signed)
[facility] DEPT Provider Note   CSN: [id number] Arrival date & time: 08/16/15  1401  First Provider Contact:  None       History   Chief Complaint Chief Complaint  Patient presents with  . Code Stroke    HPI Samantha Aguirre is a 70 y.o. female.  HPI   70 year old female with a history of COPD, CAD, diabetes, CVA, hypertension, seizures, hyperlipidemia, tremors, CVA 4-5 months ago for which she was hospitalized at [facility], presents with concern for right sided numbness and weakness. Onset of symptoms was at 1250. Patient was sitting. Symptoms are consistent with the stroke that she had previously. Symptoms mild, noticed leg grabbing. Presented as Code Stroke.   Past Medical History:  Diagnosis Date  . Arthritis   . Asthma   . Cancer ([place])   . CHF (congestive heart failure) ([place])   . COPD (chronic obstructive pulmonary disease) ([place])   . Coronary artery disease   . Diabetes mellitus without complication ([place])   . HLD (hyperlipidemia)   . Hypertension   . MI (myocardial infarction) ([place])   . Seizures ([place])   . Skin cancer   . Stroke ([place])   . Tremors of nervous system     Patient Active Problem List   Diagnosis Date Noted  . Right sided weakness 08/16/2015  . Hypothyroidism 08/16/2015  . Diabetes mellitus, type 2 ([place]) 08/16/2015  . COPD (chronic obstructive pulmonary disease) ([place]) 08/16/2015  . Tobacco abuse 08/16/2015  . Hyperlipidemia 08/16/2015  . History of CHF (congestive heart failure) 08/16/2015    Past Surgical History:  Procedure Laterality Date  . ABDOMINAL HYSTERECTOMY    . APPENDECTOMY    . CHOLECYSTECTOMY    . KNEE SURGERY     left  . ROTATOR CUFF REPAIR     right    OB History    No data available       Home Medications    Prior to Admission medications   Medication Sig Start Date End Date Taking? Authorizing Provider  dipyridamole-aspirin (AGGRENOX) 200-25 MG 12hr capsule Take 1 capsule by mouth 2 (two) times daily.     Historical Provider, MD  DULoxetine (CYMBALTA) 60 MG capsule Take 60 mg by mouth at bedtime.    Historical Provider, MD  fluticasone furoate-vilanterol (BREO ELLIPTA) 100-25 MCG/INH AEPB Inhale 1 puff into the lungs daily.    Historical Provider, MD  gabapentin (NEURONTIN) 800 MG tablet Take 800 mg by mouth 2 (two) times daily.    Historical Provider, MD  glipiZIDE-metformin (METAGLIP) 5-500 MG tablet Take 1-2 tablets by mouth See admin instructions. 2 tablets in the morning and 1 tablet at night    Historical Provider, MD  levETIRAcetam (KEPPRA) 500 MG tablet Take 500 mg by mouth 2 (two) times daily.    Historical Provider, MD  levothyroxine (SYNTHROID, LEVOTHROID) 125 MCG tablet Take 125 mcg by mouth every other day.    Historical Provider, MD  methotrexate (RHEUMATREX) 2.5 MG tablet Take 12 mg by mouth once a week. Caution:Chemotherapy. Protect from light.    Historical Provider, MD  potassium chloride (K-DUR,KLOR-CON) 10 MEQ tablet Take 10 mEq by mouth daily.    Historical Provider, MD  rosuvastatin (CRESTOR) 40 MG tablet Take 40 mg by mouth daily.    Historical Provider, MD  umeclidinium bromide (INCRUSE ELLIPTA) 62.5 MCG/INH AEPB Inhale 1 puff into the lungs daily.    Historical Provider, MD    Family History Family History  Problem Relation Age of Onset  .  Breast cancer Mother     Social History Social History  Substance Use Topics  . Smoking status: Current Every Day Smoker    Packs/day: 1.00    Types: Cigarettes  . Smokeless tobacco: Current User  . Alcohol use No     Allergies   Mushroom extract complex; Atenolol; Morphine and related; Percocet [oxycodone-acetaminophen]; Percodan [oxycodone-aspirin]; and Verapamil   Review of Systems Review of Systems  Constitutional: Negative for fever.  HENT: Negative for sore throat.   Eyes: Negative for visual disturbance.  Respiratory: Negative for cough and shortness of breath.   Cardiovascular: Negative for chest pain.    Gastrointestinal: Negative for abdominal pain, nausea and vomiting.  Genitourinary: Negative for difficulty urinating.  Musculoskeletal: Positive for arthralgias. Negative for back pain and neck pain.  Skin: Negative for rash.  Neurological: Positive for facial asymmetry, weakness and numbness. Negative for syncope, speech difficulty and headaches.     Physical Exam Updated Vital Signs BP (!) 151/65 (BP Location: Left Arm)   Pulse (!) 56   Temp 97.9 F (36.6 C) (Oral)   Resp 20   Ht 5\' 1"  (1.549 m)   Wt 191 lb 5.8 oz (86.8 kg)   SpO2 100%   BMI 36.16 kg/m   Physical Exam  Constitutional: She is oriented to person, place, and time. She appears well-developed and well-nourished. No distress.  HENT:  Head: Normocephalic and atraumatic.  Eyes: Conjunctivae and EOM are normal.  Neck: Normal range of motion.  Cardiovascular: Normal rate, regular rhythm and intact distal pulses.   Pulmonary/Chest: Effort normal and breath sounds normal. No respiratory distress. She has no wheezes. She has no rales.  Abdominal: Soft. She exhibits no distension. There is no tenderness. There is no guarding.  Musculoskeletal: She exhibits no edema or tenderness.  Neurological: She is alert and oriented to person, place, and time. A sensory deficit (reports right side with altered sensation of arm and neck) is present. No cranial nerve deficit. Coordination normal. GCS eye subscore is 4. GCS verbal subscore is 5. GCS motor subscore is 6.  Patient able to lift both legs, strength symmetric with active testing against resistance, however does report difficulty lifting right leg Normal RUE strength, no drift  Skin: Skin is warm and dry. No rash noted. She is not diaphoretic. No erythema.  Nursing note and vitals reviewed.    ED Treatments / Results  Labs (all labs ordered are listed, but only abnormal results are displayed) Labs Reviewed  CBC - Abnormal; Notable for the following:       Result Value    Hemoglobin 10.4 (*)    HCT 34.1 (*)    MCH 24.8 (*)    RDW 19.7 (*)    All other components within normal limits  COMPREHENSIVE METABOLIC PANEL - Abnormal; Notable for the following:    Potassium 3.3 (*)    Glucose, Bld 178 (*)    Calcium 8.8 (*)    Total Protein 6.4 (*)    Total Bilirubin 0.1 (*)    All other components within normal limits  URINALYSIS, ROUTINE W REFLEX MICROSCOPIC (NOT AT [facility]) - Abnormal; Notable for the following:    APPearance HAZY (*)    Glucose, UA 100 (*)    Leukocytes, UA TRACE (*)    All other components within normal limits  URINE MICROSCOPIC-ADD ON - Abnormal; Notable for the following:    Squamous Epithelial / LPF 0-5 (*)    Bacteria, UA FEW (*)  All other components within normal limits  GLUCOSE, CAPILLARY - Abnormal; Notable for the following:    Glucose-Capillary 157 (*)    All other components within normal limits  CBG MONITORING, ED - Abnormal; Notable for the following:    Glucose-Capillary 170 (*)    All other components within normal limits  I-STAT CHEM 8, ED - Abnormal; Notable for the following:    Potassium 3.3 (*)    Glucose, Bld 176 (*)    Calcium, Ion 1.07 (*)    Hemoglobin 11.2 (*)    HCT 33.0 (*)    All other components within normal limits  URINE CULTURE  PROTIME-INR  APTT  DIFFERENTIAL  TSH  HEMOGLOBIN A1C  I-STAT TROPOININ, ED    EKG  EKG Interpretation  Date/Time:  Thursday August 16 2015 14:29:50 EDT Ventricular Rate:  50 PR Interval:    QRS Duration: 87 QT Interval:  445 QTC Calculation: 406 R Axis:   42 Text Interpretation:  Sinus rhythm Low voltage, extremity and precordial leads No significant change since last tracing Confirmed by [facility] MD, Yazmine Sorey ([id number]) on 08/16/2015 3:13:05 PM       Radiology Ct Head Code Stroke Wo Contrast  Result Date: 08/16/2015 CLINICAL DATA:  Code stroke. Right-sided headache and right-sided weakness. Sudden onset earlier today at 1250 hours. EXAM: CT HEAD WITHOUT  CONTRAST TECHNIQUE: Contiguous axial images were obtained from the base of the skull through the vertex without intravenous contrast. COMPARISON:  None. FINDINGS: No definite acute stroke, acute hemorrhage, hydrocephalus, or extra-axial fluid. Chronic microvascular ischemic change affects the periventricular and subcortical white matter, of a moderately advanced nature, unchanged from prior noncontrast CT of 05/14/2015, at [facility]. Chronic areas of infarction are seen in the LEFT cerebellum, as noted on previous studies. Stable RIGHT parietal and LEFT cavernous meningiomas, when compared with prior CTA. The extracranial soft tissues unremarkable. ASPECTS [facility] Stroke Program Early CT Score, http://www.aspectsinstroke.com) - Ganglionic level infarction (caudate, lentiform nuclei, internal capsule, insula, M1-M3 cortex): 7 - Supraganglionic infarction (M4-M6 cortex): 3 Total score (0-10 with 10 being normal): 10 IMPRESSION: 1. No acute intracranial findings.  Chronic changes as described. 2. ASPECTS score 10 These results were called by telephone at the time of interpretation on 08/16/2015 at 2:30 pm to Dr. Shon Hale , who verbally acknowledged these results. Electronically Signed   By: Staci Righter M.D.   On: 08/16/2015 14:37    Procedures Procedures (including critical care time)  Medications Ordered in ED Medications  sodium chloride flush (NS) 0.9 % injection 3 mL (not administered)  enoxaparin (LOVENOX) injection 40 mg (not administered)  senna-docusate (Senokot-S) tablet 1 tablet (not administered)  ondansetron (ZOFRAN) tablet 4 mg (not administered)    Or  ondansetron (ZOFRAN) injection 4 mg (not administered)  umeclidinium bromide (INCRUSE ELLIPTA) 62.5 MCG/INH 1 puff (not administered)  levothyroxine (SYNTHROID, LEVOTHROID) tablet 125 mcg (not administered)  levETIRAcetam (KEPPRA) tablet 500 mg (not administered)  DULoxetine (CYMBALTA) DR capsule 60 mg (not administered)   dipyridamole-aspirin (AGGRENOX) 200-25 MG per 12 hr capsule 1 capsule (not administered)  rosuvastatin (CRESTOR) tablet 40 mg (not administered)  fluticasone furoate-vilanterol (BREO ELLIPTA) 100-25 MCG/INH 1 puff (not administered)  insulin aspart (novoLOG) injection 0-9 Units (not administered)  gabapentin (NEURONTIN) capsule 800 mg (not administered)  aspirin chewable tablet 324 mg (324 mg Oral Given 08/16/15 1601)     Initial Impression / Assessment and Plan / ED Course  I have reviewed the triage vital signs and the nursing notes.  Pertinent labs & imaging results that were available during my care of the patient were reviewed by me and considered in my medical decision making (see chart for details).  Clinical Course     Final Clinical Impressions(s) / ED Diagnoses   Final diagnoses:  CVA, old, facial weakness   70 year old female with a history of COPD, CAD, diabetes, CVA, hypertension, seizures, hyperlipidemia, tremors, CVA 4-5 months ago for which she was hospitalized at [facility], presents with concern for right sided numbness and weakness.  Patient was a code stroke, neurology is present at bedside at my on evaluation.  Patient with altered sensation on exam, and borderline right-sided weakness. CT head showed no acute findings. Patient most likely with stroke reactivation given this is undisturbed distribution of prior stroke. We'll admit for continued evaluation.  Patient denies any other infectious symptoms, however urinalysis ordered and is pending. No other clear metabolic etiology of increase in symptoms. Neurology recommends hospitalist admission, and will obtain records from [facility] prior to ordering additional testing.  New Prescriptions Current Discharge Medication List       Gareth Morgan, MD 08/16/15 [phone]

## 2015-08-16 NOTE — H&P (Signed)
History and Physical    Samantha Aguirre [id number] DOB: Apr 22, 1945 DOA: 08/16/2015  PCP: No primary care provider on file.  Patient coming from:   Home    Chief Complaint:  Right lower extremity weakness HPI: Samantha Aguirre is a 70 y.o. female with multiple medical problems including but not limited to COPD, coronary disease, diabetes, stroke and hypertension. Patient [place] her medical care in the [place] so limited records available in EPIC. Patient states she had a stroke 3-4 months ago and was hospitalized at [facility] for approximately 15 days. She has had previous strokes and feels pretty certain TPA was administered but not sure if it was received at time of most recent stroke. Patient states she had very little residual right-sided weakness following her last stroke until today around 12:50 PM. Today patient noticed her right lower extremity felt a little strange while sitting in the recliner. Patient walk to the kitchen and noticed that the leg was dragging. These are the same symptoms patient experienced with previous strokes. Patient states she has not had any of her home medications including Plavix for over a month as the prescriptions ran out.   ED Course:  Afebrile, hemodynamically stable, normal oxygen saturation on room air. Normal white count. Hemoglobin 10 point 4. Potassium 3.3. Glucose 178. code stroke called then cancelled, head CT negative for acute findings Neurology evaluated, code stroke discontinued. Urinalysis unremarkable  Review of Systems: As per HPI, otherwise 10 point review of systems negative.   Past Medical History:  Diagnosis Date  . Arthritis   . Asthma   . Cancer ([place])   . CHF (congestive heart failure) ([place])   . COPD (chronic obstructive pulmonary disease) ([place])   . Coronary artery disease   . Diabetes mellitus without complication ([place])   . HLD (hyperlipidemia)   . Hypertension   . MI (myocardial infarction) ([place])   .  Seizures ([place])   . Skin cancer   . Stroke ([place])   . Tremors of nervous system     Past Surgical History:  Procedure Laterality Date  . ABDOMINAL HYSTERECTOMY    . APPENDECTOMY    . CHOLECYSTECTOMY    . KNEE SURGERY     left  . ROTATOR CUFF REPAIR     right    Social History   Social History  . Marital status: Divorced    Spouse name: N/A  . Number of children: N/A  . Years of education: N/A   Occupational History  . Not on file.   Social History Main Topics  . Smoking status: Current Every Day Smoker    Packs/day: 1.00    Types: Cigarettes  . Smokeless tobacco: Current User  . Alcohol use No  . Drug use: No  . Sexual activity: Not on file   Other Topics Concern  . Not on file   Social History Narrative  . No narrative on file   Patient lives with her daughter. She is awaiting placement at an assisted living facility in [facility]. She has a cane and walker to use as needed. Patient is currently in the process of weaning herself from cigarettes.  Allergies  Allergen Reactions  . Mushroom Extract Complex Anaphylaxis    Made patient "deathly sick"  . Atenolol     "MD took me off of it bc it was doing something wrong" Made patient HYPOTENSIVE  . Morphine And Related Other (See Comments)    "stops my breathing" NEAR-RESPIRATORY ARREST  .  Percocet [Oxycodone-Acetaminophen] Nausea And Vomiting and Other (See Comments)    Stomach pains  . Percodan [Oxycodone-Aspirin] Nausea And Vomiting and Other (See Comments)    Stomach pains  . Verapamil Other (See Comments)    " MD told me this was screwing me up" MAKES PATIENT HYPOTENSIVE    Family History  Problem Relation Age of Onset  . Breast cancer Mother     Prior to Admission medications   Medication Sig Start Date End Date Taking? Authorizing Provider  dipyridamole-aspirin (AGGRENOX) 200-25 MG 12hr capsule Take 1 capsule by mouth 2 (two) times daily.    Historical Provider, MD  DULoxetine  (CYMBALTA) 60 MG capsule Take 60 mg by mouth at bedtime.    Historical Provider, MD  fluticasone furoate-vilanterol (BREO ELLIPTA) 100-25 MCG/INH AEPB Inhale 1 puff into the lungs daily.    Historical Provider, MD  gabapentin (NEURONTIN) 800 MG tablet Take 800 mg by mouth 2 (two) times daily.    Historical Provider, MD  glipiZIDE-metformin (METAGLIP) 5-500 MG tablet Take 1-2 tablets by mouth See admin instructions. 2 tablets in the morning and 1 tablet at night    Historical Provider, MD  levETIRAcetam (KEPPRA) 500 MG tablet Take 500 mg by mouth 2 (two) times daily.    Historical Provider, MD  levothyroxine (SYNTHROID, LEVOTHROID) 125 MCG tablet Take 125 mcg by mouth every other day.    Historical Provider, MD  methotrexate (RHEUMATREX) 2.5 MG tablet Take 12 mg by mouth once a week. Caution:Chemotherapy. Protect from light.    Historical Provider, MD  potassium chloride (K-DUR,KLOR-CON) 10 MEQ tablet Take 10 mEq by mouth daily.    Historical Provider, MD  rosuvastatin (CRESTOR) 40 MG tablet Take 40 mg by mouth daily.    Historical Provider, MD  umeclidinium bromide (INCRUSE ELLIPTA) 62.5 MCG/INH AEPB Inhale 1 puff into the lungs daily.    Historical Provider, MD    Physical Exam: Vitals:   08/16/15 1432 08/16/15 1515 08/16/15 1545 08/16/15 1630  BP:  (!) 120/53 147/60 (!) 151/65  Pulse:  63 (!) 59 (!) 56  Resp:  22 20 20   Temp: 98.6 F (37 C)   97.9 F (36.6 C)  TempSrc: Oral   Oral  SpO2:  100% 97% 100%  Weight:      Height: 5\' 1"  (1.549 m)       Constitutional:  Pleasant obese white female in NAD, calm, comfortable Vitals:   08/16/15 1432 08/16/15 1515 08/16/15 1545 08/16/15 1630  BP:  (!) 120/53 147/60 (!) 151/65  Pulse:  63 (!) 59 (!) 56  Resp:  22 20 20   Temp: 98.6 F (37 C)   97.9 F (36.6 C)  TempSrc: Oral   Oral  SpO2:  100% 97% 100%  Weight:      Height: 5\' 1"  (1.549 m)      Eyes: PER, lids and conjunctivae normal ENMT: Mucous membranes are moist. Posterior pharynx  clear of any exudate or lesions..  Neck: normal, supple, no masses Respiratory: clear to auscultation bilaterally, no wheezing, no crackles. Normal respiratory effort. No accessory muscle use.  Cardiovascular: Regular rate and rhythm, no murmurs / rubs / gallops. No extremity edema. 2+ pedal pulses.   Abdomen: no tenderness, no masses palpated. No hepatomegaly. Bowel sounds positive.  Musculoskeletal: no clubbing / cyanosis. No joint deformity upper and lower extremities. Good ROM, no contractures. Normal muscle tone.  Skin: no rashes, lesions, ulcers.  Neurologic: CN 2-12 grossly intact. Sensation intact, Strength 3/5 in RLE and  5/5 in remaining extremities  Psychiatric: Normal judgment and insight. Alert and oriented x 3. Normal mood.    Labs on Admission: I have personally reviewed following labs and imaging studies   Urine analysis:    Component Value Date/Time   COLORURINE YELLOW 08/16/2015 1517   APPEARANCEUR HAZY (A) 08/16/2015 1517   LABSPEC 1.016 08/16/2015 1517   PHURINE 6.5 08/16/2015 1517   GLUCOSEU 100 (A) 08/16/2015 1517   HGBUR NEGATIVE 08/16/2015 1517   BILIRUBINUR NEGATIVE 08/16/2015 1517   KETONESUR NEGATIVE 08/16/2015 1517   PROTEINUR NEGATIVE 08/16/2015 1517   NITRITE NEGATIVE 08/16/2015 1517   LEUKOCYTESUR TRACE (A) 08/16/2015 1517    Radiological Exams on Admission: Ct Head Code Stroke Wo Contrast  Result Date: 08/16/2015 CLINICAL DATA:  Code stroke. Right-sided headache and right-sided weakness. Sudden onset earlier today at 1250 hours. EXAM: CT HEAD WITHOUT CONTRAST TECHNIQUE: Contiguous axial images were obtained from the base of the skull through the vertex without intravenous contrast. COMPARISON:  None. FINDINGS: No definite acute stroke, acute hemorrhage, hydrocephalus, or extra-axial fluid. Chronic microvascular ischemic change affects the periventricular and subcortical white matter, of a moderately advanced nature, unchanged from prior noncontrast CT of  05/14/2015, at [facility]. Chronic areas of infarction are seen in the LEFT cerebellum, as noted on previous studies. Stable RIGHT parietal and LEFT cavernous meningiomas, when compared with prior CTA. The extracranial soft tissues unremarkable. ASPECTS [facility] Stroke Program Early CT Score, http://www.aspectsinstroke.com) - Ganglionic level infarction (caudate, lentiform nuclei, internal capsule, insula, M1-M3 cortex): 7 - Supraganglionic infarction (M4-M6 cortex): 3 Total score (0-10 with 10 being normal): 10 IMPRESSION: 1. No acute intracranial findings.  Chronic changes as described. 2. ASPECTS score 10 These results were called by telephone at the time of interpretation on 08/16/2015 at 2:30 pm to Dr. Shon Hale , who verbally acknowledged these results. Electronically Signed   By: Staci Righter M.D.   On: 08/16/2015 14:37    EKG: Independently reviewed.   EKG Interpretation  Date/Time:  Thursday August 16 2015 14:29:50 EDT Ventricular Rate:  50 PR Interval:    QRS Duration: 87 QT Interval:  445 QTC Calculation: 406 R Axis:   42 Text Interpretation:  Sinus rhythm Low voltage, extremity and precordial leads No significant change since last tracing Confirmed by [facility] MD, [place] ([id number]) on 08/16/2015 3:13:05 PM       Assessment/Plan   Active Problems:   Right sided weakness   Hypothyroidism   Diabetes mellitus, type 2 (HCC)   COPD (chronic obstructive pulmonary disease) (HCC)   Tobacco abuse   Hyperlipidemia   History of CHF (congestive heart failure)      Recurrent right lower extremity weakness, history of CVA  last one 3-4 months ago in [place]. Neurology has seen and obtaining records from [facility]  No acute findings on head CT today. Patient has been out of her home medications for a month, including Aggrenox -Place in observation-telemetry -Neurology has evaluated and does not feel symptoms represent acute CVA. Rather than recrudescence. No need for MRI at this point per  Neurology . -Aspirin 324 mg administered in ED -Resuming Aggrenox , statin   -Frequent neurochecks           -PT, OT -RN swallow screen, advance diet if passes  History of seizures? -Resume home Keppra  Hypothyroidism.  -Out of medication for a month, check TSH -resume home synthroid  DM 2, level of control not known -Check A1c -CBGs, moderate sliding scale insulin -Hold oral  diabetic meds  ?  rheumatoid arthritis. Was on methotrexate at home, out of meds for at least one month.  - Not resuming methotrexate for now  Hyperlipidemia.  -Continue home Crestor  History of CHF per patient.. No details available. No evidence for volume overload -baseline echo -I+0, daily wts  Hypertension. BP controlled at present. No acute CVA but possible recrudescence will allow for elevated BP for now.   ?hx of depression Continue home Cymbalta  COPD. No acute respiratory distress- -Continue home inhalers  Tobacco abuse. Patient has been weaning herself. Does not want a patch. . - RN to provide smoking cessation education   DVT prophylaxis:   Lovenox    Code Status:       DNR  Family Communication:    Attempted to discuss treatment plan with daughter Abrienne Turrill at over the phone at [phone] but no answer and no voice mail.  Disposition Plan:   Discharge home in 24-48 hours              Consults called:  Neurology Admission status:   Observation -  Telemetry     Tye Savoy NP [facility] Pager [phone]  If 7PM-7AM, please contact night-coverage [email or web address] Password [facility]  08/16/2015, 4:49 PM

## 2015-08-16 NOTE — Progress Notes (Signed)
Pt arrived to 5M20 via stretcher.  Alert and oriented, c/o pain 7/10.  Will continue to monitor.  Cori Razor, RN

## 2015-08-16 NOTE — ED Notes (Signed)
Pt was code stroke with R sided weakness. Code stroke canceled. Pt given 324mg  asa in ED. NIH 3, and swallow screen completed. Pt states she is feeling better. Slight weakness in right side

## 2015-08-16 NOTE — ED Triage Notes (Signed)
Pt had stroke 4-5 mo ago treated at [place]. Today at 1250 pt had sudden onset right sided facial droop, right sided arm/leg weakness. Pt alert upon arrival to ED

## 2015-08-17 ENCOUNTER — Observation Stay (HOSPITAL_BASED_OUTPATIENT_CLINIC_OR_DEPARTMENT_OTHER): Payer: Medicare Other

## 2015-08-17 DIAGNOSIS — E038 Other specified hypothyroidism: Secondary | ICD-10-CM

## 2015-08-17 DIAGNOSIS — I69392 Facial weakness following cerebral infarction: Secondary | ICD-10-CM | POA: Diagnosis not present

## 2015-08-17 DIAGNOSIS — J42 Unspecified chronic bronchitis: Secondary | ICD-10-CM

## 2015-08-17 DIAGNOSIS — I6789 Other cerebrovascular disease: Secondary | ICD-10-CM | POA: Diagnosis not present

## 2015-08-17 DIAGNOSIS — I69351 Hemiplegia and hemiparesis following cerebral infarction affecting right dominant side: Secondary | ICD-10-CM | POA: Diagnosis not present

## 2015-08-17 DIAGNOSIS — E118 Type 2 diabetes mellitus with unspecified complications: Secondary | ICD-10-CM

## 2015-08-17 LAB — ECHOCARDIOGRAM COMPLETE
Height: 61 in
Weight: 2958.4 oz

## 2015-08-17 LAB — URINE CULTURE

## 2015-08-17 LAB — GLUCOSE, CAPILLARY: Glucose-Capillary: 138 mg/dL — ABNORMAL HIGH (ref 65–99)

## 2015-08-17 MED ORDER — POTASSIUM CHLORIDE CRYS ER 20 MEQ PO TBCR
40.0000 meq | EXTENDED_RELEASE_TABLET | Freq: Once | ORAL | Status: AC
  Administered 2015-08-17: 40 meq via ORAL
  Filled 2015-08-17: qty 2

## 2015-08-17 MED ORDER — ACETAMINOPHEN 500 MG PO TABS
500.0000 mg | ORAL_TABLET | Freq: Four times a day (QID) | ORAL | Status: DC | PRN
  Administered 2015-08-17 – 2015-08-18 (×4): 500 mg via ORAL
  Filled 2015-08-17 (×4): qty 1

## 2015-08-17 NOTE — Progress Notes (Signed)
PROGRESS NOTE    Samantha Aguirre  [id number] DOB: [birth year] DOA: 08/16/2015 PCP: No primary care provider on file.   Brief Narrative:  Worsening right hemiparesis and mild aphasia. 70 yo female with recent CVA treated in [facility]. Had similar symptoms on prior hospitalization, but now with speech difficulty, may need repeat neuro work up.    Assessment & Plan:   Active Problems:   Right sided weakness   Hypothyroidism   Diabetes mellitus with complication (HCC)   COPD (chronic obstructive pulmonary disease) (HCC)   Tobacco abuse   HLD (hyperlipidemia)   History of CHF (congestive heart failure)   Seizures ([place])   1. CVA with right hemiparesis. Case discussed with neurology, will continue neuro checks and will get records form recent hospitalization. Will continue asa and aggrenox. Continue statin therapy with crestor.  2, DM type 2. Serum glucose [phone], will continue glucose cover with insulin sliding scale. Patient tolerating po well. Continue to hold on oral hypoglycemic agents.  3, CHF. Patient euvolemic, echocardiogram with norlmal LV function and grade 1 diastolic dysfunction. Not on b blocker or ace inh. Patient with poor functional status, is possible that symptoms are not related to heart failure.   4. COPD. Will continue 02 per [place] to target 02 sat above 92%, continue oxymetry monitor.   5. Seizures Continue keppra. No clinical signs of seizure activity.  6. Hypothyroid. Significant elevation of TSH, will continue levothyroxine at current dose for now and will check free t4 and T3. Patient hemodynamic stable.   7 Hypokalemia. K down to 3,3 renal function with cr at 3,3. Will follow renal panel in am, will give 40 kcl po x1.    DVT prophylaxis:  lovenox Code Status:  full Family Communication:  Disposition Plan: .   Consultants:   neurology  Procedures:   Antimicrobials:    Subjective: Patient with persistent right hemiparesis, worse on the  right lower extremity. Now having difficulty finding words, no nausea or vomiting, no chest pain or dyspnea.  Objective: Vitals:   08/17/15 0300 08/17/15 0458 08/17/15 0931 08/17/15 1014  BP: (!) 144/73 (!) 138/52 133/68   Pulse: 60 (!) 53 (!) 54   Resp:  16 20   Temp: 97.9 F (36.6 C) 97.7 F (36.5 C) 97.5 F (36.4 C)   TempSrc: Oral Oral Oral   SpO2: 96% 93% 97% 96%  Weight:  83.9 kg (184 lb 14.4 oz)    Height:       No intake or output data in the 24 hours ending 08/17/15 1522 Filed Weights   08/16/15 1400 08/16/15 1421 08/17/15 0458  Weight: 86.8 kg (191 lb 5.8 oz) 86.8 kg (191 lb 5.8 oz) 83.9 kg (184 lb 14.4 oz)    Examination:  General exam: deconditioned and ill looking appearing E ENT: mild conjunctival pallor, oral mucosa dry. Respiratory system: mild decreased breath sounds at bases.No wheezing, rales or rhonchi. Cardiovascular system: S1 & S2 heard, RRR. No JVD, murmurs, rubs, gallops or clicks. No pedal edema. Gastrointestinal system: Abdomen is nondistended, soft and nontender. No organomegaly or masses felt. Normal bowel sounds heard. Central nervous system: awake. Positive decrease strength on the right able to lift against gravity on the right leg but not against resistance. Left with 5/5. Extremities: Symmetric 5 x 5 power. Skin: No rashes, lesions or ulcers .     Data Reviewed: I have personally reviewed following labs and imaging studies  CBC:  Recent Labs Lab 08/16/15 1405 08/16/15 1410  WBC 5.8  --   NEUTROABS 2.7  --   HGB 10.4* 11.2*  HCT 34.1* 33.0*  MCV 81.2  --   PLT 220  --    Basic Metabolic Panel:  Recent Labs Lab 08/16/15 1405 08/16/15 1410  NA 139 142  K 3.3* 3.3*  CL 108 105  CO2 23  --   GLUCOSE 178* 176*  BUN 9 9  CREATININE 0.78 0.70  CALCIUM 8.8*  --    GFR: Estimated Creatinine Clearance: 63.3 mL/min (by C-G formula based on SCr of 0.8 mg/dL). Liver Function Tests:  Recent Labs Lab 08/16/15 1405  AST 31    ALT 18  ALKPHOS 87  BILITOT 0.1*  PROT 6.4*  ALBUMIN 3.5   No results for input(s): LIPASE, AMYLASE in the last 168 hours. No results for input(s): AMMONIA in the last 168 hours. Coagulation Profile:  Recent Labs Lab 08/16/15 1405  INR 1.05   Cardiac Enzymes: No results for input(s): CKTOTAL, CKMB, CKMBINDEX, TROPONINI in the last 168 hours. BNP (last 3 results) No results for input(s): PROBNP in the last 8760 hours. HbA1C: No results for input(s): HGBA1C in the last 72 hours. CBG:  Recent Labs Lab 08/16/15 1404 08/16/15 1648 08/16/15 2123 08/17/15 0620  GLUCAP 170* 157* 167* 138*   Lipid Profile: No results for input(s): CHOL, HDL, LDLCALC, TRIG, CHOLHDL, LDLDIRECT in the last 72 hours. Thyroid Function Tests:  Recent Labs  08/16/15 1743  TSH 43.280*   Anemia Panel: No results for input(s): VITAMINB12, FOLATE, FERRITIN, TIBC, IRON, RETICCTPCT in the last 72 hours. Sepsis Labs: No results for input(s): PROCALCITON, LATICACIDVEN in the last 168 hours.  Recent Results (from the past 240 hour(s))  Urine culture     Status: Abnormal   Collection Time: 08/16/15  3:17 PM  Result Value Ref Range Status   Specimen Description URINE, RANDOM  Final   Special Requests NONE  Final   Culture MULTIPLE SPECIES PRESENT, SUGGEST RECOLLECTION (A)  Final   Report Status 08/17/2015 FINAL  Final         Radiology Studies: Ct Head Code Stroke Wo Contrast  Result Date: 08/16/2015 CLINICAL DATA:  Code stroke. Right-sided headache and right-sided weakness. Sudden onset earlier today at 1250 hours. EXAM: CT HEAD WITHOUT CONTRAST TECHNIQUE: Contiguous axial images were obtained from the base of the skull through the vertex without intravenous contrast. COMPARISON:  None. FINDINGS: No definite acute stroke, acute hemorrhage, hydrocephalus, or extra-axial fluid. Chronic microvascular ischemic change affects the periventricular and subcortical white matter, of a moderately advanced  nature, unchanged from prior noncontrast CT of 05/14/2015, at [facility]. Chronic areas of infarction are seen in the LEFT cerebellum, as noted on previous studies. Stable RIGHT parietal and LEFT cavernous meningiomas, when compared with prior CTA. The extracranial soft tissues unremarkable. ASPECTS [facility] Stroke Program Early CT Score, http://www.aspectsinstroke.com) - Ganglionic level infarction (caudate, lentiform nuclei, internal capsule, insula, M1-M3 cortex): 7 - Supraganglionic infarction (M4-M6 cortex): 3 Total score (0-10 with 10 being normal): 10 IMPRESSION: 1. No acute intracranial findings.  Chronic changes as described. 2. ASPECTS score 10 These results were called by telephone at the time of interpretation on 08/16/2015 at 2:30 pm to Dr. Shon Hale , who verbally acknowledged these results. Electronically Signed   By: Staci Righter M.D.   On: 08/16/2015 14:37        Scheduled Meds: . dipyridamole-aspirin  1 capsule Oral BID  . DULoxetine  60 mg Oral QHS  . enoxaparin (LOVENOX)  injection  40 mg Subcutaneous Q24H  . fluticasone furoate-vilanterol  1 puff Inhalation Daily  . gabapentin  800 mg Oral BID  . insulin aspart  0-9 Units Subcutaneous TID WC  . levETIRAcetam  500 mg Oral BID  . levothyroxine  125 mcg Oral QODAY  . rosuvastatin  40 mg Oral Daily  . sodium chloride flush  3 mL Intravenous Q12H  . umeclidinium bromide  1 puff Inhalation Daily   Continuous Infusions:    LOS: 0 days        Angelli Baruch Gerome Apley, MD [facility] Pager [phone]  If 7PM-7AM, please contact night-coverage [email or web address] Password [email or web address] 08/17/2015, 3:22 PM

## 2015-08-17 NOTE — Evaluation (Signed)
Occupational Therapy Evaluation Patient Details Name: Samantha Aguirre MRN: [id number] DOB: [birth year] Today's Date: 08/17/2015    History of Present Illness Patient is a 70 y/o female with hx of CVA, seizures, MI, HTN, HLD, DM, CAD, COPD and CHF present with Right sided weakness. CT head- unremarkable. No MRi being done. Neurology does not feel symptoms represent acute CVA. Rather than recrudescence   Clinical Impression   Pt reports she was independent with ADL PTA. Currently pt overall min guard for ADL and min assist for functional mobility. Pt presenting with RUE weakness, decreased fine motor coordination, impaired sensation in RUE, and decreased balance impacting her independence and safety with ADL and functional mobility. Pt reports 6 falls in the last month at home. Recommending SNF at this time to maximize independence and safety with ADL and functional mobility. Pt would benefit from continued skilled OT to address established goals.    Follow Up Recommendations  SNF;Supervision/Assistance - 24 hour (will need HHOT vs outpt OT if not SNF)    Equipment Recommendations  3 in 1 bedside comode;Tub/shower seat    Recommendations for Other Services       Precautions / Restrictions Precautions Precautions: Fall Restrictions Weight Bearing Restrictions: No      Mobility Bed Mobility Overal bed mobility: Needs Assistance Bed Mobility: Supine to Sit     Supine to sit: Supervision;HOB elevated     General bed mobility comments: Pt OOB in chair upon arrival.  Transfers Overall transfer level: Needs assistance Equipment used: Rolling walker (2 wheeled) Transfers: Sit to/from Stand Sit to Stand: Min assist         General transfer comment: Min guard to boost up from chair. Min assist for balance in standing.    Balance Overall balance assessment: Needs assistance Sitting-balance support: Feet supported;No upper extremity supported Sitting balance-Leahy Scale:  Fair     Standing balance support: No upper extremity supported;During functional activity Standing balance-Leahy Scale: Fair Standing balance comment: Able to stand at sink and complete grooming activities without UE support.                            ADL Overall ADL's : Needs assistance/impaired Eating/Feeding: Set up;Sitting Eating/Feeding Details (indicate cue type and reason): To drink coffee Grooming: Min guard;Wash/dry hands;Standing   Upper Body Bathing: Set up;Supervision/ safety;Sitting   Lower Body Bathing: Min guard;Sit to/from stand   Upper Body Dressing : Set up;Supervision/safety;Sitting   Lower Body Dressing: Min guard;Sit to/from stand   Toilet Transfer: Minimal assistance;Ambulation;BSC;RW Toilet Transfer Details (indicate cue type and reason): BSC over toilet Toileting- Clothing Manipulation and Hygiene: Min guard;Sit to/from stand Toileting - Clothing Manipulation Details (indicate cue type and reason): for peri care and clothing manipulation     Functional mobility during ADLs: Minimal assistance;Rolling walker General ADL Comments: Pt with posterior LOB x1 with initial sit to stand from [facility]; able to correct with min assist. Educated pt on sensation changes in RUE and being careful with hot/cold. Encouraged functional use of RUE.      Vision Additional Comments: Appears WFL.   Perception     Praxis      Pertinent Vitals/Pain Pain Assessment: Faces Faces Pain Scale: Hurts little more Pain Location: head Pain Descriptors / Indicators: Aching;Headache Pain Intervention(s): Monitored during session     Hand Dominance Right   Extremity/Trunk Assessment Upper Extremity Assessment Upper Extremity Assessment: RUE deficits/detail RUE Deficits / Details: AROM over all WFL. Strength: shoulder  flex/ext 4/5, elbow flex/ext 5/5. Slight decrease in grip strength. Impaired fine motor coordination. Reports dull feeling in compairson to sensation on  L hand. RUE Sensation: decreased light touch RUE Coordination: decreased fine motor   Lower Extremity Assessment Lower Extremity Assessment: Defer to PT evaluation RLE Deficits / Details: Grossly ~4/5 throughout except hip flexion 2+/5. RLE Sensation: decreased light touch   Cervical / Trunk Assessment Cervical / Trunk Assessment: Normal   Communication Communication Communication: No difficulties   Cognition Arousal/Alertness: Awake/alert Behavior During Therapy: WFL for tasks assessed/performed Overall Cognitive Status: No family/caregiver present to determine baseline cognitive functioning       Memory: Decreased short-term memory             General Comments       Exercises       Shoulder Instructions      Home Living Family/patient expects to be discharged to:: Private residence Living Arrangements: Children Available Help at Discharge: Family Type of Home: Mobile home Home Access: Stairs to enter Technical brewer of Steps: 4 Entrance Stairs-Rails: Right Home Layout: One level     Bathroom Shower/Tub: Tub/shower unit Shower/tub characteristics: Curtain Biochemist, clinical: Standard Bathroom Accessibility: No   Home Equipment: Environmental consultant - 2 wheels;Cane - single point;Electric scooter          Prior Functioning/Environment Level of Independence: Independent        Comments: Furniture walker at baseline. Can walk longer distances holding onto cart at [place]. Does not drive. Gets assist at times for bathing. Daughter works 1 day/week and watches grandchildren other days. Per patient, she is getting ready to move into her own place.     OT Diagnosis: Generalized weakness;Acute pain   OT Problem List: Decreased strength;Decreased range of motion;Impaired balance (sitting and/or standing);Decreased coordination;Decreased knowledge of use of DME or AE;Decreased knowledge of precautions;Impaired sensation;Impaired UE functional use;Pain   OT  Treatment/Interventions: Self-care/ADL training;Therapeutic exercise;Neuromuscular education;Energy conservation;DME and/or AE instruction;Therapeutic activities;Patient/family education;Balance training    OT Goals(Current goals can be found in the care plan section) Acute Rehab OT Goals Patient Stated Goal: rehab to get to be independent OT Goal Formulation: With patient Time For Goal Achievement: 08/31/15 Potential to Achieve Goals: Good ADL Goals Pt Will Perform Grooming: with modified independence;standing Pt Will Perform Upper Body Bathing: with modified independence;sitting Pt Will Perform Lower Body Bathing: with modified independence;sit to/from stand Pt Will Transfer to Toilet: with modified independence;ambulating;bedside commode Pt Will Perform Toileting - Clothing Manipulation and hygiene: with modified independence;sit to/from stand Pt Will Perform Tub/Shower Transfer: Tub transfer;with supervision;shower seat;ambulating;rolling walker Pt/caregiver will Perform Home Exercise Program: Increased strength;Right Upper extremity;With theraband;Independently;With written HEP provided (increase fine motor coordination)  OT Frequency: Min 2X/week   Barriers to D/C:            Co-evaluation              End of Session Equipment Utilized During Treatment: Gait belt;Rolling walker  Activity Tolerance: Patient tolerated treatment well Patient left: in chair;with call bell/phone within reach;with chair alarm set   Time: [id number] OT Time Calculation (min): 24 min Charges:  OT General Charges $OT Visit: 1 Procedure OT Evaluation $OT Eval Moderate Complexity: 1 Procedure OT Treatments $Self Care/Home Management : 8-22 mins G-Codes: OT G-codes **NOT FOR INPATIENT CLASS** Functional Assessment Tool Used: Clinical judgement Functional Limitation: Self care Self Care Current Status [id number]): At least 1 percent but less than 20 percent impaired, limited or restricted Self Care  Goal Status [id number]): At least  1 percent but less than 20 percent impaired, limited or restricted   Binnie Kand M.S., OTR/L Pager: [phone]  08/17/2015, 12:13 PM

## 2015-08-17 NOTE — Care Management Obs Status (Signed)
[place] NOTIFICATION   Patient Details  Name: Samantha Aguirre MRN: [id number] Date of Birth: [birth year]   Medicare Observation Status Notification Given:  Yes    Pollie Friar, RN 08/17/2015, 3:04 PM

## 2015-08-17 NOTE — Progress Notes (Signed)
OT Cancellation Note  Patient Details Name: Samantha Aguirre MRN: [id number] DOB: 05/17/1945   Cancelled Treatment:    Reason Eval/Treat Not Completed: Patient at procedure or test/ unavailable. Will follow up for OT eval as time allows.  Binnie Kand M.S., OTR/L Pager: [phone]  08/17/2015, 8:27 AM

## 2015-08-17 NOTE — Progress Notes (Signed)
  Echocardiogram 2D Echocardiogram has been performed.  Desteni Piscopo 08/17/2015, 8:51 AM

## 2015-08-17 NOTE — Evaluation (Signed)
Physical Therapy Evaluation Patient Details Name: Samantha Aguirre MRN: [id number] DOB: Feb 24, 1945 Today's Date: 08/17/2015   History of Present Illness  Patient is a 70 y/o female with hx of CVA, seizures, MI, HTN, HLD, DM, CAD, COPD and CHF present with Right sided weakness. CT head- unremarkable. No MRi being done. Neurology does not feel symptoms represent acute CVA. Rather than recrudescence  Clinical Impression  Patient presents with right sided weakness, impaired sensation, coordination and difficulty holding objects in right hand s/p above impacting mobility. Tolerated gait training with Min A for balance/safety due to right knee instability and decreased functional strength. Would benefit from RW for support but may not work in home environment. Pt just moved from [facility] and living with daughter temporarily but wants to move into her own place. Eager to return to [facility] and really interested in short term SNF. Will follow acutely to maximize independence and mobility prior to return home.    Follow Up Recommendations SNF;Supervision - Intermittent (understand pt is obs (has a request where) otherwise interested in outpatient)    Equipment Recommendations  None recommended by PT    Recommendations for Other Services       Precautions / Restrictions Precautions Precautions: Fall Restrictions Weight Bearing Restrictions: No      Mobility  Bed Mobility Overal bed mobility: Needs Assistance Bed Mobility: Supine to Sit     Supine to sit: Supervision;HOB elevated     General bed mobility comments: No assist needed. Use of rail and effortful to bring RLE to EOB. + dizziness - resolved within 1 minute.  Transfers Overall transfer level: Needs assistance Equipment used: 1 person hand held assist Transfers: Sit to/from Stand Sit to Stand: Min assist         General transfer comment: Min A to boost to standing with tremoring in LUE (reports as seizure, lasted 10 sec);  reaching for furniture for support upon standing,. Transferred to chair post ambulation.  Ambulation/Gait Ambulation/Gait assistance: Min assist   Assistive device: 1 person hand held assist (rail) Gait Pattern/deviations: Step-to pattern;Step-through pattern;Decreased stance time - left;Decreased step length - right Gait velocity: decreased Gait velocity interpretation: <1.8 ft/sec, indicative of risk for recurrent falls General Gait Details: Unsteady gait with pt reaching for rail and wall for bilateral support; + epsiode of dizziness looking down during gait. instability RLE but no buckling. Will try RW next session (it will not fit in hallway at trailer).  Stairs            Wheelchair Mobility    Modified Rankin (Stroke Patients Only) Modified Rankin (Stroke Patients Only) Pre-Morbid Rankin Score: Moderate disability Modified Rankin: Moderately severe disability     Balance Overall balance assessment: Needs assistance Sitting-balance support: Feet supported;No upper extremity supported Sitting balance-Leahy Scale: Fair     Standing balance support: During functional activity;Single extremity supported Standing balance-Leahy Scale: Poor Standing balance comment: Requires at least 1 UE support during static and dynamic activities in standing.                              Pertinent Vitals/Pain Pain Assessment: Faces Faces Pain Scale: Hurts little more Pain Location: RLE- hip into thigh Pain Descriptors / Indicators: Sore;Discomfort Pain Intervention(s): Monitored during session    Home Living Family/patient expects to be discharged to:: Private residence Living Arrangements: Children Available Help at Discharge: Family Type of Home: House Home Access: Stairs to enter Entrance Stairs-Rails: Right Entrance  Stairs-Number of Steps: 4 Home Layout: One level Home Equipment: Sparks - 2 wheels;Cane - single point;Electric scooter      Prior Function Level  of Independence: Independent         Comments: Furniture walker at baseline. Can walk longer distances holding onto cart at [place]. Does not drive. Gets assist at times for bathing. Daughter works 1 day/week and watches grandchildren other days. Per patient, she is getting ready to move into her own place.      Hand Dominance   Dominant Hand: Right    Extremity/Trunk Assessment   Upper Extremity Assessment: Defer to OT evaluation;RUE deficits/detail (Decreased grip right vs left hand. Decreased coorindation noted RUE during thumb to finger- slower.) RUE Deficits / Details: Limited AROM RUE compared to left but functional. + drift on RUE.   RUE Sensation: decreased light touch     Lower Extremity Assessment: RLE deficits/detail RLE Deficits / Details: Grossly ~4/5 throughout except hip flexion 2+/5.       Communication   Communication: No difficulties  Cognition Arousal/Alertness: Awake/alert Behavior During Therapy: WFL for tasks assessed/performed Overall Cognitive Status: Within Functional Limits for tasks assessed                      General Comments General comments (skin integrity, edema, etc.): Pt really wants to go to rehab so she can become independent to be able to move into her own place. Right now, pt lives with daughter and moved from [facility].     Exercises        Assessment/Plan    PT Assessment Patient needs continued PT services  PT Diagnosis Difficulty walking;Abnormality of gait   PT Problem List Decreased strength;Decreased mobility;Decreased range of motion;Decreased balance;Pain;Impaired sensation;Decreased knowledge of use of DME  PT Treatment Interventions Gait training;Functional mobility training;Neuromuscular re-education;Stair training;Balance training;Therapeutic exercise;Therapeutic activities;Patient/family education   PT Goals (Current goals can be found in the Care Plan section) Acute Rehab PT Goals Patient Stated Goal: "to  be independent so i can move into my own place" PT Goal Formulation: With patient Time For Goal Achievement: 08/31/15 Potential to Achieve Goals: Good    Frequency Min 4X/week   Barriers to discharge Inaccessible home environment stairs to enter home     Co-evaluation               End of Session Equipment Utilized During Treatment: Gait belt Activity Tolerance: Patient tolerated treatment well Patient left: in chair;with call bell/phone within reach;with chair alarm set Nurse Communication: Mobility status    Functional Assessment Tool Used: clinical judgment Functional Limitation: Mobility: Walking and moving around Mobility: Walking and Moving Around Current Status [id number]): At least 20 percent but less than 40 percent impaired, limited or restricted Mobility: Walking and Moving Around Goal Status [phone]): At least 1 percent but less than 20 percent impaired, limited or restricted    Time: 0953-1017 PT Time Calculation (min) (ACUTE ONLY): 24 min   Charges:   PT Evaluation $PT Eval Low Complexity: 1 Procedure PT Treatments $Gait Training: 8-22 mins   PT G Codes:   PT G-Codes **NOT FOR INPATIENT CLASS** Functional Assessment Tool Used: clinical judgment Functional Limitation: Mobility: Walking and moving around Mobility: Walking and Moving Around Current Status [id number]): At least 20 percent but less than 40 percent impaired, limited or restricted Mobility: Walking and Moving Around Goal Status [id number]): At least 1 percent but less than 20 percent impaired, limited or restricted    Samantha Aguirre A  Samantha Aguirre 08/17/2015, 10:27 AM  Wray Kearns, PT, DPT [phone]

## 2015-08-17 NOTE — Progress Notes (Signed)
Subjective: patient was stated to have a right facial droop and inability to open her right eye for 2 minutes and completely resolved. Currently still has a HA but no other complaints.   Exam: Vitals:   08/17/15 0458 08/17/15 0931  BP: (!) 138/52 133/68  Pulse: (!) 53 (!) 54  Resp: 16 20  Temp: 97.7 F (36.5 C) 97.5 F (36.4 C)    HEENT-  Normocephalic, no lesions, without obvious abnormality.  Normal external eye and conjunctiva.  Normal TM's bilaterally.  Normal auditory canals and external ears. Normal external nose, mucus membranes and septum.  Normal pharynx. Cardiovascular- S1, S2 normal, pulses palpable throughout   Lungs- chest clear, no wheezing, rales, normal symmetric air entry, Heart exam - S1, S2 normal, no murmur, no gallop, rate regular Abdomen- normal findings: bowel sounds normal Extremities- no edema     Gen: In bed, NAD Mental Status: Alert, oriented, thought content appropriate.  Speech fluent without evidence of aphasia.  Able to follow 3 step commands without difficulty. Cranial Nerves: II:  Visual fields grossly normal, pupils equal, round, reactive to light and accommodation III,IV, VI: ptosis not present, extra-ocular motions intact bilaterally V,VII: smile symmetric, facial light touch sensation decreased on the right VIII: hearing normal bilaterally IX,X: uvula rises symmetrically XI: bilateral shoulder shrug XII: midline tongue extension Motor: Right :            Upper extremity   5/5                                                                              Left:     Upper extremity   5/5                       Lower extremity   5/5                                                                                          Lower extremity   5/5 Tone and bulk:normal tone throughout; no atrophy noted Sensory: Pinprick and light touch decreased on the right Deep Tendon Reflexes: 2+ and symmetric throughout UE and left LE but no KJ on the right. No  AJ Plantars: Right: downgoing                                                              Left: downgoing Cerebellar: normal finger-to-nose with positional and intention tremor and normal heel-to-shin test  Pertinent Labs/Diagnostics: TSH 43.28  Etta Quill PA-C [facility] [phone]  Impression: 70 YO female with recrudescence of previous stroke in the setting of TSH 43.28. As with  previous note given her symptoms do not believe this represents stroke as these symptoms are stereotypical of her old symptoms. Given her elevated TSH would further investigate her mettabolic issues. Would still recommend getting her records from [place] incase of further care later in the future.    Neurology will S/O at this time.     08/17/2015, 2:32 PM

## 2015-08-17 NOTE — Care Management Note (Signed)
Case Management Note  Patient Details  Name: Samantha Aguirre MRN: [id number] Date of Birth: 1945/05/22  Subjective/Objective:    Pt in with rt sided weakness. CTH negative. Pt with recent CVA per pt. She is from home with her daughter.  Patient states she does not have regular transportation.                Action/Plan: CM met with the patient and provided her the application and information on [facility]. PT/OT recommendations are for SNF but pt has Medicare and will not qualify for a SNF stay since she is here under observation status. CM explained this to her and inquired about home health services. Pt was in agreement with having home health and would like to use [facility] when the doctor is ready for discharge and places the orders for [facility] services. MD updated. CM continuing to follow for d/c needs.   Expected Discharge Date:                  Expected Discharge Plan:  [place]  In-House Referral:     Discharge planning Services  CM Consult  Post Acute Care Choice:    Choice offered to:     DME Arranged:    DME Agency:     HH Arranged:    [place] Agency:     Status of Service:  In process, will continue to follow  If discussed at Long Length of Stay Meetings, dates discussed:    Additional Comments:  Pollie Friar, RN 08/17/2015, 3:29 PM

## 2015-08-18 DIAGNOSIS — I69351 Hemiplegia and hemiparesis following cerebral infarction affecting right dominant side: Secondary | ICD-10-CM | POA: Diagnosis not present

## 2015-08-18 DIAGNOSIS — R569 Unspecified convulsions: Secondary | ICD-10-CM | POA: Diagnosis not present

## 2015-08-18 DIAGNOSIS — J42 Unspecified chronic bronchitis: Secondary | ICD-10-CM | POA: Diagnosis not present

## 2015-08-18 DIAGNOSIS — Z8679 Personal history of other diseases of the circulatory system: Secondary | ICD-10-CM

## 2015-08-18 DIAGNOSIS — I69392 Facial weakness following cerebral infarction: Secondary | ICD-10-CM | POA: Diagnosis not present

## 2015-08-18 LAB — BASIC METABOLIC PANEL
ANION GAP: 10 (ref 5–15)
BUN: 11 mg/dL (ref 6–20)
CALCIUM: 9 mg/dL (ref 8.9–10.3)
CO2: 23 mmol/L (ref 22–32)
CREATININE: 0.64 mg/dL (ref 0.44–1.00)
Chloride: 106 mmol/L (ref 101–111)
Glucose, Bld: 124 mg/dL — ABNORMAL HIGH (ref 65–99)
Potassium: 4.1 mmol/L (ref 3.5–5.1)
SODIUM: 139 mmol/L (ref 135–145)

## 2015-08-18 LAB — HEMOGLOBIN A1C
HEMOGLOBIN A1C: 6.6 % — AB (ref 4.8–5.6)
Mean Plasma Glucose: 143 mg/dL

## 2015-08-18 LAB — GLUCOSE, CAPILLARY
GLUCOSE-CAPILLARY: 149 mg/dL — AB (ref 65–99)
Glucose-Capillary: 176 mg/dL — ABNORMAL HIGH (ref 65–99)
Glucose-Capillary: 127 mg/dL — ABNORMAL HIGH (ref 65–99)
Glucose-Capillary: 137 mg/dL — ABNORMAL HIGH (ref 65–99)

## 2015-08-18 LAB — T4, FREE: Free T4: 0.47 ng/dL — ABNORMAL LOW (ref 0.61–1.12)

## 2015-08-18 MED ORDER — LEVOTHYROXINE SODIUM 25 MCG PO TABS
125.0000 ug | ORAL_TABLET | ORAL | Status: DC
  Administered 2015-08-19: 125 ug via ORAL
  Filled 2015-08-18: qty 1

## 2015-08-18 MED ORDER — LEVOTHYROXINE SODIUM 75 MCG PO TABS: 150.0000 ug | ORAL_TABLET | ORAL | Status: DC

## 2015-08-18 NOTE — Progress Notes (Signed)
PROGRESS NOTE    Samantha Aguirre  [id number] DOB: Jul 08, 1945 DOA: 08/16/2015 PCP: No primary care provider on file.   Brief Narrative: Worsening right hemiparesis and mild aphasia. 70 yo female with recent CVA treated in [facility]. Had similar symptoms on prior hospitalization, but now with speech difficulty, may need repeat neuro work up.    Assessment & Plan:   Active Problems:   Right sided weakness   Other specified hypothyroidism   Diabetes mellitus with complication (HCC)   COPD (chronic obstructive pulmonary disease) (HCC)   Tobacco abuse   HLD (hyperlipidemia)   History of CHF (congestive heart failure)   Seizures (HCC)   CVA, old, facial weakness   1. CVA with right hemiparesis. Clinically improving.  Will continue asa and aggrenox. Continue statin therapy with crestor. Will resume home pt at discharge.   2, DM type 2. Serum glucose 130-149-176  will continue glucose cover with insulin sliding scale. Patient tolerating po well. Continue to hold on oral hypoglycemic agents.  3, CHF. Patient euvolemic, echocardiogram with norlmal LV function and grade 1 diastolic dysfunction. Patient with poor functional status, is possible that symptoms are not related to heart failure. Will hold b blockade due to risk of bradycardia, will hold ace inh for now, blood pressure has been as low as [id number] systolic, will refer for outpatient follow up.   4. COPD. Will continue 02 per [place] to target 02 sat above 92%, continue oxymetry monitor.   5. Seizures. Continue keppra.  6. Hypothyroid. Patient admits been non compliant with levothyroxine, will continue current dose and will consult social services to help with medications. Patient currently with no significant hypothyroid symptoms.   7 Hypokalemia. Potassium has been corrected at 4.1, renal function stable with cr at 0.64.   DVT prophylaxis:  lovenox Code Status:  full Family Communication:  Disposition Plan: .   Consultants:     neurology  Procedures:   Antimicrobials:    Subjective: Patient with persistent weakness on the right lower extremity with mild improvement, right upper extremity with improved strength. No nausea or vomiting. Patient has been not taking levothyroxine at home, unable to get medications as outpatient. No cold intolerance. No confusion.     Objective: Vitals:   08/17/15 2123 08/18/15 0116 08/18/15 0452 08/18/15 0500  BP: 133/87 (!) 130/56 (!) 142/58   Pulse: (!) 54 (!) 57 (!) 56   Resp: 16 16 16    Temp: 98.8 F (37.1 C) 97.8 F (36.6 C) 97.8 F (36.6 C)   TempSrc: Oral Oral Oral   SpO2: 96% 94% 95%   Weight:    85 kg (187 lb 4.8 oz)  Height:        Intake/Output Summary (Last 24 hours) at 08/18/15 0917 Last data filed at 08/17/15 2122  Gross per 24 hour  Intake                3 ml  Output                0 ml  Net                3 ml   Filed Weights   08/16/15 1421 08/17/15 0458 08/18/15 0500  Weight: 86.8 kg (191 lb 5.8 oz) 83.9 kg (184 lb 14.4 oz) 85 kg (187 lb 4.8 oz)    Examination:  General exam: Deconditioned E ENT: mild conjuctibal pallor but no icterus. Respiratory system: Respiratory effort normal. No wheezing, rales or rhonchi Cardiovascular system:  S1 & S2 heard, RRR. No JVD, murmurs, rubs, gallops or clicks. No pedal edema. Gastrointestinal system: Abdomen is nondistended, soft and nontender. No organomegaly or masses felt. Normal bowel sounds heard. Central nervous system: Alert and oriented. Right lower extremity weakness 4/5 hand grip 4/5 on the right, CN 2 -12 intact. Skin: No rashes, lesions or ulcers Psychiatry: Judgement and insight appear normal. Mood & affect appropriate.     Data Reviewed: I have personally reviewed following labs and imaging studies  CBC:  Recent Labs Lab 08/16/15 1405 08/16/15 1410  WBC 5.8  --   NEUTROABS 2.7  --   HGB 10.4* 11.2*  HCT 34.1* 33.0*  MCV 81.2  --   PLT 220  --    Basic Metabolic  Panel:  Recent Labs Lab 08/16/15 1405 08/16/15 1410 08/18/15 0552  NA 139 142 139  K 3.3* 3.3* 4.1  CL 108 105 106  CO2 23  --  23  GLUCOSE 178* 176* 124*  BUN 9 9 11   CREATININE 0.78 0.70 0.64  CALCIUM 8.8*  --  9.0   GFR: Estimated Creatinine Clearance: 63.8 mL/min (by C-G formula based on SCr of 0.8 mg/dL). Liver Function Tests:  Recent Labs Lab 08/16/15 1405  AST 31  ALT 18  ALKPHOS 87  BILITOT 0.1*  PROT 6.4*  ALBUMIN 3.5   No results for input(s): LIPASE, AMYLASE in the last 168 hours. No results for input(s): AMMONIA in the last 168 hours. Coagulation Profile:  Recent Labs Lab 08/16/15 1405  INR 1.05   Cardiac Enzymes: No results for input(s): CKTOTAL, CKMB, CKMBINDEX, TROPONINI in the last 168 hours. BNP (last 3 results) No results for input(s): PROBNP in the last 8760 hours. HbA1C:  Recent Labs  08/16/15 1405  HGBA1C 6.6*   CBG:  Recent Labs Lab 08/16/15 1404 08/16/15 1648 08/16/15 2123 08/17/15 0620 08/18/15 0451  GLUCAP 170* 157* 167* 138* 149*   Lipid Profile: No results for input(s): CHOL, HDL, LDLCALC, TRIG, CHOLHDL, LDLDIRECT in the last 72 hours. Thyroid Function Tests:  Recent Labs  08/16/15 1743 08/18/15 0552  TSH 43.280*  --   FREET4  --  0.47*   Anemia Panel: No results for input(s): VITAMINB12, FOLATE, FERRITIN, TIBC, IRON, RETICCTPCT in the last 72 hours. Sepsis Labs: No results for input(s): PROCALCITON, LATICACIDVEN in the last 168 hours.  Recent Results (from the past 240 hour(s))  Urine culture     Status: Abnormal   Collection Time: 08/16/15  3:17 PM  Result Value Ref Range Status   Specimen Description URINE, RANDOM  Final   Special Requests NONE  Final   Culture MULTIPLE SPECIES PRESENT, SUGGEST RECOLLECTION (A)  Final   Report Status 08/17/2015 FINAL  Final         Radiology Studies: Ct Head Code Stroke Wo Contrast  Result Date: 08/16/2015 CLINICAL DATA:  Code stroke. Right-sided headache and  right-sided weakness. Sudden onset earlier today at 1250 hours. EXAM: CT HEAD WITHOUT CONTRAST TECHNIQUE: Contiguous axial images were obtained from the base of the skull through the vertex without intravenous contrast. COMPARISON:  None. FINDINGS: No definite acute stroke, acute hemorrhage, hydrocephalus, or extra-axial fluid. Chronic microvascular ischemic change affects the periventricular and subcortical white matter, of a moderately advanced nature, unchanged from prior noncontrast CT of 05/14/2015, at [facility]. Chronic areas of infarction are seen in the LEFT cerebellum, as noted on previous studies. Stable RIGHT parietal and LEFT cavernous meningiomas, when compared with prior CTA. The extracranial soft  tissues unremarkable. ASPECTS [facility] Stroke Program Early CT Score, [email or web address]) - Ganglionic level infarction (caudate, lentiform nuclei, internal capsule, insula, M1-M3 cortex): 7 - Supraganglionic infarction (M4-M6 cortex): 3 Total score (0-10 with 10 being normal): 10 IMPRESSION: 1. No acute intracranial findings.  Chronic changes as described. 2. ASPECTS score 10 These results were called by telephone at the time of interpretation on 08/16/2015 at 2:30 pm to Dr. Shon Hale , who verbally acknowledged these results. Electronically Signed   By: Staci Righter M.D.   On: 08/16/2015 14:37        Scheduled Meds: . dipyridamole-aspirin  1 capsule Oral BID  . DULoxetine  60 mg Oral QHS  . enoxaparin (LOVENOX) injection  40 mg Subcutaneous Q24H  . fluticasone furoate-vilanterol  1 puff Inhalation Daily  . gabapentin  800 mg Oral BID  . insulin aspart  0-9 Units Subcutaneous TID WC  . levETIRAcetam  500 mg Oral BID  . [START ON 08/19/2015] levothyroxine  125 mcg Oral QODAY  . rosuvastatin  40 mg Oral Daily  . sodium chloride flush  3 mL Intravenous Q12H  . umeclidinium bromide  1 puff Inhalation Daily   Continuous Infusions:    LOS: 0 days      Isami Mehra Gerome Apley, MD [facility] Pager [phone]  If 7PM-7AM, please contact night-coverage [email or web address] Password [email or web address] 08/18/2015, 9:17 AM

## 2015-08-19 DIAGNOSIS — Z8679 Personal history of other diseases of the circulatory system: Secondary | ICD-10-CM | POA: Diagnosis not present

## 2015-08-19 DIAGNOSIS — R569 Unspecified convulsions: Secondary | ICD-10-CM | POA: Diagnosis not present

## 2015-08-19 DIAGNOSIS — J410 Simple chronic bronchitis: Secondary | ICD-10-CM | POA: Diagnosis not present

## 2015-08-19 DIAGNOSIS — E118 Type 2 diabetes mellitus with unspecified complications: Secondary | ICD-10-CM | POA: Diagnosis not present

## 2015-08-19 DIAGNOSIS — I69351 Hemiplegia and hemiparesis following cerebral infarction affecting right dominant side: Secondary | ICD-10-CM | POA: Diagnosis not present

## 2015-08-19 LAB — GLUCOSE, CAPILLARY
Glucose-Capillary: 125 mg/dL — ABNORMAL HIGH (ref 65–99)
Glucose-Capillary: 164 mg/dL — ABNORMAL HIGH (ref 65–99)

## 2015-08-19 MED ORDER — ROSUVASTATIN CALCIUM 40 MG PO TABS: 40.0000 mg | ORAL_TABLET | Freq: Every day | ORAL | 0 refills | Status: DC

## 2015-08-19 MED ORDER — UMECLIDINIUM BROMIDE 62.5 MCG/INH IN AEPB: 1.0000 | INHALATION_SPRAY | Freq: Every day | RESPIRATORY_TRACT | 0 refills | Status: DC

## 2015-08-19 MED ORDER — ASPIRIN-DIPYRIDAMOLE ER 25-200 MG PO CP12
1.0000 | ORAL_CAPSULE | Freq: Two times a day (BID) | ORAL | 0 refills | Status: DC
Start: 2015-08-19 — End: 2015-09-19

## 2015-08-19 MED ORDER — LEVOTHYROXINE SODIUM 125 MCG PO TABS
125.0000 ug | ORAL_TABLET | ORAL | 0 refills | Status: DC
Start: 2015-08-19 — End: 2015-09-19

## 2015-08-19 MED ORDER — LEVETIRACETAM 500 MG PO TABS: 500.0000 mg | ORAL_TABLET | Freq: Two times a day (BID) | ORAL | 0 refills | Status: DC

## 2015-08-19 MED ORDER — FLUTICASONE FUROATE-VILANTEROL 100-25 MCG/INH IN AEPB: 1.0000 | INHALATION_SPRAY | Freq: Every day | RESPIRATORY_TRACT | 0 refills | Status: DC

## 2015-08-19 NOTE — Progress Notes (Signed)
08/19/15 1400 nursing Patient discharged to home per wheelchair accompanied by NT and family members. Discharged instructions given no further questions.

## 2015-08-19 NOTE — Progress Notes (Signed)
CM met with pt to confirm choice of [facility]; pt confirms.  Referral  called [facility] [phone] and Edmonia Lynch requested I fax the orders to [phone].  CM faxed requested information.  No other CM needs were communicated.

## 2015-08-19 NOTE — Progress Notes (Signed)
Occupational Therapy Treatment Patient Details Name: Meda Pascuzzi MRN: [id number] DOB: Apr 14, 1945 Today's Date: 08/19/2015    History of present illness Patient is a 70 y/o female with hx of CVA, seizures, MI, HTN, HLD, DM, CAD, COPD and CHF present with Right sided weakness. CT head- unremarkable. No MRi being done. Neurology does not feel symptoms represent acute CVA. Rather than recrudescence   OT comments  Pt reports > 6 falls in a month and limited space to mobilize with RW in current living environment. Pt being denied for SNF and will require follow up therapy in the home if unable to be placed at SNF. Pt currently with R UE deficits and previous R Ue cast in the last 6 months. CM please order home therapy if continues to have denial for SNF.    Follow Up Recommendations  SNF;Supervision/Assistance - 24 hour    Equipment Recommendations  3 in 1 bedside comode;Tub/shower seat    Recommendations for Other Services      Precautions / Restrictions Precautions Precautions: Fall       Mobility Bed Mobility               General bed mobility comments: in chair on arrival  Transfers                      Balance                                   ADL                                         General ADL Comments: Session focused on R UE hand exercises. pt reports wearing cast on R wrist ~6 months ago due to fx. pt reports no follow up care after cast removed. pt with facial grimace with wrist extension and digit extension but able to perform task. pt educated on repetition of this task and wrist flexion with digit extension. Provided red theraputty , red foam for utensil build up and x3 hand outs for fine motor. Pt return demo of all fine motor exercises. Pt educated to complete bil hand task adls and Iadls to help with strengthening. Pt reports home is very small mobile home with limited mobility. Pt reports no space to use RW in  the home and awaiting placement in [place] 8 housing.       Vision                 Additional Comments: wears glasses but no glasses present. Read aloud worksheets to patient with return demo   Perception     Praxis      Cognition   Behavior During Therapy: [facility] for tasks assessed/performed Overall Cognitive Status: No family/caregiver present to determine baseline cognitive functioning                       Extremity/Trunk Assessment               Exercises     Shoulder Instructions       General Comments      Pertinent Vitals/ Pain       Pain Assessment: Faces Faces Pain Scale: Hurts little more Pain Location: R wrist with hand exercises Pain Descriptors / Indicators: Numbness Pain Intervention(s): Limited  activity within patient's tolerance;Monitored during session  Home Living                                          Prior Functioning/Environment              Frequency Min 2X/week     Progress Toward Goals  OT Goals(current goals can now be found in the care plan section)  Progress towards OT goals: Progressing toward goals  Acute Rehab OT Goals Patient Stated Goal: rehab to get to be independent OT Goal Formulation: With patient Time For Goal Achievement: 08/31/15 Potential to Achieve Goals: Good ADL Goals Pt Will Perform Grooming: with modified independence;standing Pt Will Perform Upper Body Bathing: with modified independence;sitting Pt Will Perform Lower Body Bathing: with modified independence;sit to/from stand Pt Will Transfer to Toilet: with modified independence;ambulating;bedside commode Pt Will Perform Toileting - Clothing Manipulation and hygiene: with modified independence;sit to/from stand Pt Will Perform Tub/Shower Transfer: Tub transfer;with supervision;shower seat;ambulating;rolling walker Pt/caregiver will Perform Home Exercise Program: Increased strength;Right Upper extremity;With  theraband;Independently;With written HEP provided  Plan Discharge plan remains appropriate    Co-evaluation                 End of Session     Activity Tolerance Patient tolerated treatment well   Patient Left in chair;with call bell/phone within reach   Nurse Communication Mobility status;Precautions        Time: [id number] OT Time Calculation (min): 19 min  Charges: OT General Charges $OT Visit: 1 Procedure OT Treatments $Therapeutic Exercise: 8-22 mins  Parke Poisson B 08/19/2015, 9:52 AM  Jeri Modena   OTR/L Pager: [phone] Office: [phone] .

## 2015-08-19 NOTE — Progress Notes (Signed)
The patient called staff to witness abnormal arm movement in her right arm.  She described this as a seizure and states that she has been having them at home.  Her medications have undergone  a recent adjustment by neurology and she blames this for the breakthrough seizure activity.  There were no other symptoms present during the 5-7 minute episode that resolved spontaneously.

## 2015-08-19 NOTE — Discharge Summary (Signed)
Physician Discharge Summary  Samantha Aguirre [id number] DOB: [birth year] DOA: 08/16/2015  PCP: No primary care provider on file.  Admit date: 08/16/2015 Discharge date: 08/19/2015  Admitted From:  Home Disposition:  Home  Recommendations for Outpatient Follow-up:  1. Follow up with PCP in 1-2 weeks  Home Health: Yes Equipment/Devices: no new   Discharge Condition: stable CODE STATUS: full Diet recommendation: Heart Healthy / Carb Modified  Brief/Interim Summary: This is a 70 year old female who presents to the hospital with a chief complaint of right lower extremity weakness. Patient had a recent CVA with right-sided weakness, the day of hospitalization her weakness had an acute worsening to the point where she had more difficulty ambulating. Apparently patient has been not taking her medications. On the initial physical examination her blood pressure was 120/53, heart rate 63, respiratory 22, oxygen saturation 100%. Her mucous membranes were moist, neck was supple, her lungs were clear to auscultation bilaterally, heart S1-S2 present rhythmic, her abdomen was soft nontender, extremities had no edema. She was awake and alert, her right lower extremity had 3 out of 5 strength , the other 3 had 5-5. Her serum sodium was 139, potassium 3.3, creatinine 0.78, glucose 178, white count 5.8, hemoglobin 10.4, hematocrit 34.1, platelet count 220. Her EKG had low voltage, sinus bradycardia, 50 bpm, poor R to R wave progression. Head CT was negative for acute intracranial findings, chronic areas of infarction in the left cerebellum.  Patient was admitted to the hospital with the working diagnosis of right lower extremity weakness rule out worsening/acute CVA.  1. Recrudescence of old CVA. Patient was placed back on antiplatelet therapy with Aggrenox, statin therapy with rosuvastatin 40 mg. Patient's symptoms improved, with increased strength on her right lower extremity. Patient was seen by neurology with  recommendations of continue medical therapy and physical therapy. Her symptoms were attributed for a recrudescence of her old CVA no evidence of a new CVA. Physical therapy recommended skilled nursing facility, patient admitted under observation status, for now will order home health with a close follow-up as an outpatient.  2. Hypothyroidism. Patient's TSH was found to be high at 43.2, her free T4 was low 0.47. Patient admitted she has been not taking her levothyroxine. Social services were consulted to assure patient continues her medical therapy.  3. Heart failure. Diastolic dysfunction, chronic and stable, patient remained euvolemic, at this point holding beta-blockade due to risk of bradycardia, no ace inhibitors due to  risk of hypotension.  4. Diabetes mellitus type II. Patient was placed on insulin sliding-scale for glucose coverage, her serum glucose had remained between 130 and 170. Patient will resume her glipizide by the time of discharge.  5. Seizures. Patient continue Keppra as per her usual regimen. The day of discharge she had a repetitive movements of her right upper extremity, no loss of consciousness, unclear if this is a seizure, patient reports having these symptoms for many years, her neurologist is aware of this situation.  6. COPD. Remained stable through this hospitalization. Patient will be continued on fluticasone, Vilanterol.  7. Chronic hypokalemia. She'll continue potassium supplements. Her discharge potassium is 4.1, her kidney function has a creatinine 0.64.  Discharge Diagnoses:  Active Problems:   Right sided weakness   Other specified hypothyroidism   Diabetes mellitus with complication (HCC)   COPD (chronic obstructive pulmonary disease) (HCC)   Tobacco abuse   HLD (hyperlipidemia)   History of CHF (congestive heart failure)   Seizures (HCC)   CVA, old, facial weakness  Discharge Instructions  Discharge Instructions    Diet - low sodium heart  healthy    Complete by:  As directed   Discharge instructions    Complete by:  As directed   Please follow with primary care in 7 days   Face-to-face encounter (required for Medicare/Medicaid patients)    Complete by:  As directed   I Samantha Aguirre certify that this patient is under my care and that I, or a nurse practitioner or physician's assistant working with me, had a face-to-face encounter that meets the physician face-to-face encounter requirements with this patient on 08/19/2015. The encounter with the patient was in whole, or in part for the following medical condition(s) which is the primary reason for home health care (List medical condition): Right lower extremity weakness, CVA.   The encounter with the patient was in whole, or in part, for the following medical condition, which is the primary reason for home health care:  CVA   I certify that, based on my findings, the following services are medically necessary home health services:  Physical therapy   Reason for Medically Necessary Home Health Services:  Skilled Nursing- Teaching of Disease Process/Symptom Management   My clinical findings support the need for the above services:  Unable to leave home safely without assistance and/or assistive device   Further, I certify that my clinical findings support that this patient is homebound due to:  Unable to leave home safely without assistance   Home Health    Complete by:  As directed   To provide the following care/treatments:   Samantha Aguirre OT     Increase activity slowly    Complete by:  As directed       Medication List    TAKE these medications   dipyridamole-aspirin 200-25 MG 12hr capsule Commonly known as:  AGGRENOX Take 1 capsule by mouth 2 (two) times daily.   DULoxetine 60 MG capsule Commonly known as:  CYMBALTA Take 60 mg by mouth at bedtime.   fluticasone furoate-vilanterol 100-25 MCG/INH Aepb Commonly known as:  BREO ELLIPTA Inhale 1 puff into the lungs daily.    gabapentin 800 MG tablet Commonly known as:  NEURONTIN Take 800 mg by mouth 2 (two) times daily.   glipiZIDE-metformin 5-500 MG tablet Commonly known as:  METAGLIP Take 1-2 tablets by mouth See admin instructions. 2 tablets in the morning and 1 tablet at night   levETIRAcetam 500 MG tablet Commonly known as:  KEPPRA Take 1 tablet (500 mg total) by mouth 2 (two) times daily.   levothyroxine 125 MCG tablet Commonly known as:  SYNTHROID, LEVOTHROID Take 1 tablet (125 mcg total) by mouth every other day.   nitroGLYCERIN 0.4 MG SL tablet Commonly known as:  NITROSTAT Place 0.4 mg under the tongue every 5 (five) minutes as needed for chest pain.   potassium chloride 10 MEQ tablet Commonly known as:  K-DUR,KLOR-CON Take 10 mEq by mouth daily.   rosuvastatin 40 MG tablet Commonly known as:  CRESTOR Take 1 tablet (40 mg total) by mouth daily.   TYLENOL 325 MG tablet Generic drug:  acetaminophen Take 650 mg by mouth at bedtime.   umeclidinium bromide 62.5 MCG/INH Aepb Commonly known as:  INCRUSE ELLIPTA Inhale 1 puff into the lungs daily.      Follow-up Information    Primary Care Follow up in 1 week(s).          Allergies  Allergen Reactions  . Mushroom Extract Complex Anaphylaxis  Made patient "deathly sick"  . Atenolol     "MD took me off of it bc it was doing something wrong" Made patient HYPOTENSIVE  . Morphine And Related Other (See Comments)    "stops my breathing" NEAR-RESPIRATORY ARREST  . Percocet [Oxycodone-Acetaminophen] Nausea And Vomiting and Other (See Comments)    Stomach pains  . Percodan [Oxycodone-Aspirin] Nausea And Vomiting and Other (See Comments)    Stomach pains  . Verapamil Other (See Comments)    " MD told me this was screwing me up" MAKES PATIENT HYPOTENSIVE    Consultations: Neurology.   Procedures/Studies: Ct Head Code Stroke Wo Contrast  Result Date: 08/16/2015 CLINICAL DATA:  Code stroke. Right-sided headache and  right-sided weakness. Sudden onset earlier today at 1250 hours. EXAM: CT HEAD WITHOUT CONTRAST TECHNIQUE: Contiguous axial images were obtained from the base of the skull through the vertex without intravenous contrast. COMPARISON:  None. FINDINGS: No definite acute stroke, acute hemorrhage, hydrocephalus, or extra-axial fluid. Chronic microvascular ischemic change affects the periventricular and subcortical white matter, of a moderately advanced nature, unchanged from prior noncontrast CT of 05/14/2015, at [facility]. Chronic areas of infarction are seen in the LEFT cerebellum, as noted on previous studies. Stable RIGHT parietal and LEFT cavernous meningiomas, when compared with prior CTA. The extracranial soft tissues unremarkable. ASPECTS [facility] Stroke Program Early CT Score, [email or web address]) - Ganglionic level infarction (caudate, lentiform nuclei, internal capsule, insula, M1-M3 cortex): 7 - Supraganglionic infarction (M4-M6 cortex): 3 Total score (0-10 with 10 being normal): 10 IMPRESSION: 1. No acute intracranial findings.  Chronic changes as described. 2. ASPECTS score 10 These results were called by telephone at the time of interpretation on 08/16/2015 at 2:30 pm to Dr. Shon Hale , who verbally acknowledged these results. Electronically Signed   By: Staci Righter M.D.   On: 08/16/2015 14:37      Subjective:   Discharge Exam: Vitals:   08/19/15 0037 08/19/15 0528  BP: (!) 128/53 (!) 148/57  Pulse: (!) 55 61  Resp: 18 16  Temp: 98.6 F (37 C) 98.9 F (37.2 C)   Vitals:   08/18/15 1758 08/18/15 2131 08/19/15 0037 08/19/15 0528  BP: 121/65 (!) 118/45 (!) 128/53 (!) 148/57  Pulse: 63 (!) 56 (!) 55 61  Resp: 18 18 18 16   Temp: 98.9 F (37.2 C) 98.1 F (36.7 C) 98.6 F (37 C) 98.9 F (37.2 C)  TempSrc: Oral Oral Oral Oral  SpO2: 98% 96% 98% 95%  Weight:      Height:        General: Samantha Aguirre is alert, awake, not in acute distress Cardiovascular: RRR, S1/S2 +, no  rubs, no gallops Respiratory: CTA bilaterally, no wheezing, no rhonchi Abdominal: Soft, NT, ND, bowel sounds + Extremities: no edema, no cyanosis Neurology. Right lower extremity with improved strength, patient is able to lift right lower extremity against gravity.    The results of significant diagnostics from this hospitalization (including imaging, microbiology, ancillary and laboratory) are listed below for reference.     Microbiology: Recent Results (from the past 240 hour(s))  Urine culture     Status: Abnormal   Collection Time: 08/16/15  3:17 PM  Result Value Ref Range Status   Specimen Description URINE, RANDOM  Final   Special Requests NONE  Final   Culture MULTIPLE SPECIES PRESENT, SUGGEST RECOLLECTION (A)  Final   Report Status 08/17/2015 FINAL  Final     Labs: BNP (last 3 results) No results for input(s): BNP in the  last 8760 hours. Basic Metabolic Panel:  Recent Labs Lab 08/16/15 1405 08/16/15 1410 08/18/15 0552  NA 139 142 139  K 3.3* 3.3* 4.1  CL 108 105 106  CO2 23  --  23  GLUCOSE 178* 176* 124*  BUN 9 9 11   CREATININE 0.78 0.70 0.64  CALCIUM 8.8*  --  9.0   Liver Function Tests:  Recent Labs Lab 08/16/15 1405  AST 31  ALT 18  ALKPHOS 87  BILITOT 0.1*  PROT 6.4*  ALBUMIN 3.5   No results for input(s): LIPASE, AMYLASE in the last 168 hours. No results for input(s): AMMONIA in the last 168 hours. CBC:  Recent Labs Lab 08/16/15 1405 08/16/15 1410  WBC 5.8  --   NEUTROABS 2.7  --   HGB 10.4* 11.2*  HCT 34.1* 33.0*  MCV 81.2  --   PLT 220  --    Cardiac Enzymes: No results for input(s): CKTOTAL, CKMB, CKMBINDEX, TROPONINI in the last 168 hours. BNP: Invalid input(s): POCBNP CBG:  Recent Labs Lab 08/18/15 0451 08/18/15 1252 08/18/15 1642 08/18/15 2129 08/19/15 0606  GLUCAP 149* 176* 127* 137* 125*   D-Dimer No results for input(s): DDIMER in the last 72 hours. Hgb A1c  Recent Labs  08/16/15 1405  HGBA1C 6.6*    Lipid Profile No results for input(s): CHOL, HDL, LDLCALC, TRIG, CHOLHDL, LDLDIRECT in the last 72 hours. Thyroid function studies  Recent Labs  08/16/15 1743  TSH 43.280*   Anemia work up No results for input(s): VITAMINB12, FOLATE, FERRITIN, TIBC, IRON, RETICCTPCT in the last 72 hours. Urinalysis    Component Value Date/Time   COLORURINE YELLOW 08/16/2015 1517   APPEARANCEUR HAZY (A) 08/16/2015 1517   LABSPEC 1.016 08/16/2015 1517   PHURINE 6.5 08/16/2015 1517   GLUCOSEU 100 (A) 08/16/2015 1517   HGBUR NEGATIVE 08/16/2015 1517   BILIRUBINUR NEGATIVE 08/16/2015 1517   KETONESUR NEGATIVE 08/16/2015 1517   PROTEINUR NEGATIVE 08/16/2015 1517   NITRITE NEGATIVE 08/16/2015 1517   LEUKOCYTESUR TRACE (A) 08/16/2015 1517   Sepsis Labs Invalid input(s): PROCALCITONIN,  WBC,  LACTICIDVEN Microbiology Recent Results (from the past 240 hour(s))  Urine culture     Status: Abnormal   Collection Time: 08/16/15  3:17 PM  Result Value Ref Range Status   Specimen Description URINE, RANDOM  Final   Special Requests NONE  Final   Culture MULTIPLE SPECIES PRESENT, SUGGEST RECOLLECTION (A)  Final   Report Status 08/17/2015 FINAL  Final     Time coordinating discharge: 45 minutes  SIGNED:   Tawni Millers, MD  [facility] 08/19/2015, 9:06 AM Pager   If 7PM-7AM, please contact night-coverage [email or web address] Password [email or web address]

## 2015-08-20 LAB — T3, FREE: T3 FREE: 1.6 pg/mL — AB (ref 2.0–4.4)

## 2015-09-16 ENCOUNTER — Emergency Department: Payer: Medicare Other

## 2015-09-16 ENCOUNTER — Inpatient Hospital Stay
Admission: EM | Admit: 2015-09-16 | Discharge: 2015-09-19 | DRG: 065 | Disposition: A | Discharge status: Home Health | Payer: Medicare Other | Attending: Internal Medicine | Admitting: Internal Medicine

## 2015-09-16 ENCOUNTER — Encounter: Payer: Self-pay | Admitting: Radiology

## 2015-09-16 DIAGNOSIS — J449 Chronic obstructive pulmonary disease, unspecified: Secondary | ICD-10-CM | POA: Diagnosis not present

## 2015-09-16 DIAGNOSIS — Z91128 Patient's intentional underdosing of medication regimen for other reason: Secondary | ICD-10-CM | POA: Diagnosis not present

## 2015-09-16 DIAGNOSIS — F329 Major depressive disorder, single episode, unspecified: Secondary | ICD-10-CM | POA: Diagnosis present

## 2015-09-16 DIAGNOSIS — I639 Cerebral infarction, unspecified: Principal | ICD-10-CM | POA: Diagnosis present

## 2015-09-16 DIAGNOSIS — R569 Unspecified convulsions: Secondary | ICD-10-CM | POA: Diagnosis present

## 2015-09-16 DIAGNOSIS — R001 Bradycardia, unspecified: Secondary | ICD-10-CM | POA: Diagnosis present

## 2015-09-16 DIAGNOSIS — I509 Heart failure, unspecified: Secondary | ICD-10-CM | POA: Diagnosis present

## 2015-09-16 DIAGNOSIS — R29818 Other symptoms and signs involving the nervous system: Secondary | ICD-10-CM | POA: Diagnosis not present

## 2015-09-16 DIAGNOSIS — Y92009 Unspecified place in unspecified non-institutional (private) residence as the place of occurrence of the external cause: Secondary | ICD-10-CM | POA: Diagnosis not present

## 2015-09-16 DIAGNOSIS — E119 Type 2 diabetes mellitus without complications: Secondary | ICD-10-CM | POA: Diagnosis present

## 2015-09-16 DIAGNOSIS — Z85828 Personal history of other malignant neoplasm of skin: Secondary | ICD-10-CM

## 2015-09-16 DIAGNOSIS — Z8673 Personal history of transient ischemic attack (TIA), and cerebral infarction without residual deficits: Secondary | ICD-10-CM

## 2015-09-16 DIAGNOSIS — R200 Anesthesia of skin: Secondary | ICD-10-CM | POA: Diagnosis not present

## 2015-09-16 DIAGNOSIS — Z803 Family history of malignant neoplasm of breast: Secondary | ICD-10-CM

## 2015-09-16 DIAGNOSIS — T463X6A Underdosing of coronary vasodilators, initial encounter: Secondary | ICD-10-CM | POA: Diagnosis present

## 2015-09-16 DIAGNOSIS — R29810 Facial weakness: Secondary | ICD-10-CM | POA: Diagnosis present

## 2015-09-16 DIAGNOSIS — Z79899 Other long term (current) drug therapy: Secondary | ICD-10-CM

## 2015-09-16 DIAGNOSIS — I2510 Atherosclerotic heart disease of native coronary artery without angina pectoris: Secondary | ICD-10-CM | POA: Diagnosis present

## 2015-09-16 DIAGNOSIS — Z7984 Long term (current) use of oral hypoglycemic drugs: Secondary | ICD-10-CM | POA: Diagnosis not present

## 2015-09-16 DIAGNOSIS — I252 Old myocardial infarction: Secondary | ICD-10-CM

## 2015-09-16 DIAGNOSIS — I110 Hypertensive heart disease with heart failure: Secondary | ICD-10-CM | POA: Diagnosis present

## 2015-09-16 DIAGNOSIS — G8191 Hemiplegia, unspecified affecting right dominant side: Secondary | ICD-10-CM | POA: Diagnosis present

## 2015-09-16 DIAGNOSIS — F17210 Nicotine dependence, cigarettes, uncomplicated: Secondary | ICD-10-CM | POA: Diagnosis present

## 2015-09-16 DIAGNOSIS — E785 Hyperlipidemia, unspecified: Secondary | ICD-10-CM | POA: Diagnosis present

## 2015-09-16 DIAGNOSIS — I10 Essential (primary) hypertension: Secondary | ICD-10-CM | POA: Diagnosis not present

## 2015-09-16 DIAGNOSIS — E039 Hypothyroidism, unspecified: Secondary | ICD-10-CM | POA: Diagnosis present

## 2015-09-16 DIAGNOSIS — R531 Weakness: Secondary | ICD-10-CM

## 2015-09-16 LAB — COMPREHENSIVE METABOLIC PANEL
ALBUMIN: 4 g/dL (ref 3.5–5.0)
ALK PHOS: 85 U/L (ref 38–126)
ALT: 16 U/L (ref 14–54)
AST: 25 U/L (ref 15–41)
Anion gap: 7 (ref 5–15)
BILIRUBIN TOTAL: 0.5 mg/dL (ref 0.3–1.2)
BUN: 10 mg/dL (ref 6–20)
CO2: 25 mmol/L (ref 22–32)
CREATININE: 0.77 mg/dL (ref 0.44–1.00)
Calcium: 8.8 mg/dL — ABNORMAL LOW (ref 8.9–10.3)
Chloride: 108 mmol/L (ref 101–111)
GFR calc Af Amer: >60 mL/min (ref >60)
GLUCOSE: 166 mg/dL — AB (ref 65–99)
POTASSIUM: 3.4 mmol/L — AB (ref 3.5–5.1)
Sodium: 140 mmol/L (ref 135–145)
TOTAL PROTEIN: 7.2 g/dL (ref 6.5–8.1)

## 2015-09-16 LAB — CBC
HEMATOCRIT: 35 % (ref 35.0–47.0)
HEMOGLOBIN: 11.7 g/dL — AB (ref 12.0–16.0)
MCH: 26.8 pg (ref 26.0–34.0)
MCHC: 33.5 g/dL (ref 32.0–36.0)
MCV: 79.9 fL — ABNORMAL LOW (ref 80.0–100.0)
Platelets: 249 10*3/uL (ref 150–440)
RBC: 4.38 MIL/uL (ref 3.80–5.20)
RDW: 22.2 % — ABNORMAL HIGH (ref 11.5–14.5)
WBC: 7.4 10*3/uL (ref 3.6–11.0)

## 2015-09-16 LAB — DIFFERENTIAL
BASOS ABS: 0.1 10*3/uL (ref 0–0.1)
Basophils Relative: 1 %
EOS ABS: 0.1 10*3/uL (ref 0–0.7)
Eosinophils Relative: 1 %
LYMPHS ABS: 2.9 10*3/uL (ref 1.0–3.6)
LYMPHS PCT: 39 %
MONOS PCT: 7 %
Monocytes Absolute: 0.5 10*3/uL (ref 0.2–0.9)
NEUTROS ABS: 3.9 10*3/uL (ref 1.4–6.5)
NEUTROS PCT: 52 %

## 2015-09-16 LAB — GLUCOSE, CAPILLARY
Glucose-Capillary: 159 mg/dL — ABNORMAL HIGH (ref 65–99)
Glucose-Capillary: 111 mg/dL — ABNORMAL HIGH (ref 65–99)
Glucose-Capillary: 187 mg/dL — ABNORMAL HIGH (ref 65–99)

## 2015-09-16 LAB — TROPONIN I
Troponin I: <0.03 ng/mL (ref <0.03)
Troponin I: <0.03 ng/mL (ref <0.03)

## 2015-09-16 LAB — PROTIME-INR
INR: 1
Prothrombin Time: 13.2 seconds (ref 11.4–15.2)

## 2015-09-16 LAB — APTT: APTT: 35 s (ref 24–36)

## 2015-09-16 MED ORDER — GLIPIZIDE 5 MG PO TABS
5.0000 mg | ORAL_TABLET | Freq: Two times a day (BID) | ORAL | Status: DC
  Administered 2015-09-17 – 2015-09-19 (×5): 5 mg via ORAL
  Filled 2015-09-16 (×5): qty 1

## 2015-09-16 MED ORDER — SODIUM CHLORIDE 0.9% FLUSH: 3.0000 mL | INTRAVENOUS | Status: DC | PRN

## 2015-09-16 MED ORDER — GABAPENTIN 400 MG PO CAPS
800.0000 mg | ORAL_CAPSULE | Freq: Two times a day (BID) | ORAL | Status: DC
  Administered 2015-09-16 – 2015-09-19 (×6): 800 mg via ORAL
  Filled 2015-09-16 (×6): qty 2

## 2015-09-16 MED ORDER — SODIUM CHLORIDE 0.9% FLUSH
3.0000 mL | Freq: Two times a day (BID) | INTRAVENOUS | Status: DC
  Administered 2015-09-16 – 2015-09-19 (×6): 3 mL via INTRAVENOUS

## 2015-09-16 MED ORDER — UMECLIDINIUM BROMIDE 62.5 MCG/INH IN AEPB
1.0000 | INHALATION_SPRAY | Freq: Every day | RESPIRATORY_TRACT | Status: DC
  Administered 2015-09-16 – 2015-09-19 (×4): 1 via RESPIRATORY_TRACT
  Filled 2015-09-16: qty 7

## 2015-09-16 MED ORDER — GABAPENTIN 400 MG PO CAPS: 400.0000 mg | ORAL_CAPSULE | Freq: Two times a day (BID) | ORAL | Status: DC

## 2015-09-16 MED ORDER — SODIUM CHLORIDE 0.9 % IV BOLUS (SEPSIS)
500.0000 mL | Freq: Once | INTRAVENOUS | Status: AC
  Administered 2015-09-16: 500 mL via INTRAVENOUS

## 2015-09-16 MED ORDER — DIPHENHYDRAMINE HCL 50 MG/ML IJ SOLN
INTRAMUSCULAR | Status: AC
  Administered 2015-09-16: 50 mg
  Filled 2015-09-16: qty 1

## 2015-09-16 MED ORDER — DULOXETINE HCL 60 MG PO CPEP
60.0000 mg | ORAL_CAPSULE | Freq: Every day | ORAL | Status: DC
  Administered 2015-09-16 – 2015-09-18 (×3): 60 mg via ORAL
  Filled 2015-09-16 (×3): qty 1

## 2015-09-16 MED ORDER — GABAPENTIN 800 MG PO TABS
800.0000 mg | ORAL_TABLET | Freq: Two times a day (BID) | ORAL | Status: DC
  Filled 2015-09-16: qty 1

## 2015-09-16 MED ORDER — ASPIRIN-DIPYRIDAMOLE ER 25-200 MG PO CP12
1.0000 | ORAL_CAPSULE | Freq: Two times a day (BID) | ORAL | Status: DC
  Administered 2015-09-16 – 2015-09-19 (×6): 1 via ORAL
  Filled 2015-09-16 (×7): qty 1

## 2015-09-16 MED ORDER — IOPAMIDOL (ISOVUE-370) INJECTION 76%
75.0000 mL | Freq: Once | INTRAVENOUS | Status: AC | PRN
  Administered 2015-09-16: 75 mL via INTRAVENOUS

## 2015-09-16 MED ORDER — POTASSIUM CHLORIDE CRYS ER 10 MEQ PO TBCR
10.0000 meq | EXTENDED_RELEASE_TABLET | Freq: Every day | ORAL | Status: DC
  Administered 2015-09-16 – 2015-09-19 (×4): 10 meq via ORAL
  Filled 2015-09-16 (×4): qty 1

## 2015-09-16 MED ORDER — LEVOTHYROXINE SODIUM 50 MCG PO TABS
125.0000 ug | ORAL_TABLET | ORAL | Status: DC
  Administered 2015-09-17 – 2015-09-19 (×2): 125 ug via ORAL
  Filled 2015-09-16 (×2): qty 3

## 2015-09-16 MED ORDER — GLIPIZIDE-METFORMIN HCL 5-500 MG PO TABS: 1.0000 | ORAL_TABLET | ORAL | Status: DC

## 2015-09-16 MED ORDER — LEVETIRACETAM 500 MG PO TABS
500.0000 mg | ORAL_TABLET | Freq: Two times a day (BID) | ORAL | Status: DC
  Administered 2015-09-16 – 2015-09-19 (×6): 500 mg via ORAL
  Filled 2015-09-16 (×6): qty 1

## 2015-09-16 MED ORDER — ROSUVASTATIN CALCIUM 20 MG PO TABS
40.0000 mg | ORAL_TABLET | Freq: Every day | ORAL | Status: DC
  Administered 2015-09-17 – 2015-09-19 (×3): 40 mg via ORAL
  Filled 2015-09-16 (×3): qty 2

## 2015-09-16 MED ORDER — INSULIN ASPART 100 UNIT/ML ~~LOC~~ SOLN: 0.0000 [IU] | Freq: Every day | SUBCUTANEOUS | Status: DC

## 2015-09-16 MED ORDER — SODIUM CHLORIDE 0.9% FLUSH
3.0000 mL | Freq: Two times a day (BID) | INTRAVENOUS | Status: DC
  Administered 2015-09-17 – 2015-09-19 (×2): 3 mL via INTRAVENOUS

## 2015-09-16 MED ORDER — METFORMIN HCL 500 MG PO TABS
500.0000 mg | ORAL_TABLET | Freq: Two times a day (BID) | ORAL | Status: DC
  Administered 2015-09-17 – 2015-09-19 (×5): 500 mg via ORAL
  Filled 2015-09-16 (×5): qty 1

## 2015-09-16 MED ORDER — ENOXAPARIN SODIUM 40 MG/0.4ML ~~LOC~~ SOLN
40.0000 mg | SUBCUTANEOUS | Status: DC
  Administered 2015-09-16 – 2015-09-18 (×3): 40 mg via SUBCUTANEOUS
  Filled 2015-09-16 (×3): qty 0.4

## 2015-09-16 MED ORDER — SODIUM CHLORIDE 0.9 % IV SOLN: 250.0000 mL | INTRAVENOUS | Status: DC | PRN

## 2015-09-16 MED ORDER — INSULIN ASPART 100 UNIT/ML ~~LOC~~ SOLN
0.0000 [IU] | Freq: Three times a day (TID) | SUBCUTANEOUS | Status: DC
  Administered 2015-09-17: 1 [IU] via SUBCUTANEOUS
  Administered 2015-09-17: 2 [IU] via SUBCUTANEOUS
  Administered 2015-09-18: 09:00:00 1 [IU] via SUBCUTANEOUS
  Filled 2015-09-16 (×2): qty 1
  Filled 2015-09-16: qty 2

## 2015-09-16 MED ORDER — FLUTICASONE FUROATE-VILANTEROL 100-25 MCG/INH IN AEPB
1.0000 | INHALATION_SPRAY | Freq: Every day | RESPIRATORY_TRACT | Status: DC
  Administered 2015-09-17 – 2015-09-19 (×3): 1 via RESPIRATORY_TRACT
  Filled 2015-09-16: qty 28

## 2015-09-16 MED ORDER — ACETAMINOPHEN 325 MG PO TABS
650.0000 mg | ORAL_TABLET | Freq: Every day | ORAL | Status: DC
  Administered 2015-09-16 – 2015-09-18 (×3): 650 mg via ORAL
  Filled 2015-09-16 (×5): qty 2

## 2015-09-16 MED ORDER — ASPIRIN 81 MG PO CHEW
324.0000 mg | CHEWABLE_TABLET | Freq: Once | ORAL | Status: AC
  Administered 2015-09-16: 324 mg via ORAL
  Filled 2015-09-16: qty 4

## 2015-09-16 NOTE — H&P (Signed)
Samantha Aguirre is an 70 y.o. female.   Chief Complaint: Right sided weakness HPI: Has history of right sided weakness from what patient describes as past CVA's 2 or 3 times. She was hospitalized at [facility] on 08/16/15 for same type of right sided weakness symptoms. Discharged on aggrenox but says she has run out of them as well as other medications. Says her rightsided weakness has improved slightly in the ED. She also said she had right facial droop which has resolved.  Past Medical History:  Diagnosis Date  . Arthritis   . Asthma   . Cancer ([place])   . CHF (congestive heart failure) ([place])   . COPD (chronic obstructive pulmonary disease) ([place])   . Coronary artery disease   . Diabetes mellitus without complication ([place])   . HLD (hyperlipidemia)   . Hypertension   . MI (myocardial infarction) ([place])   . Seizures ([place])   . Skin cancer   . Stroke ([place])   . Tremors of nervous system     Past Surgical History:  Procedure Laterality Date  . ABDOMINAL HYSTERECTOMY    . APPENDECTOMY    . CHOLECYSTECTOMY    . KNEE SURGERY     left  . ROTATOR CUFF REPAIR     right    Family History  Problem Relation Age of Onset  . Breast cancer Mother    Social History:  reports that she has been smoking Cigarettes.  She has been smoking about 1.00 pack per day. She uses smokeless tobacco. She reports that she does not drink alcohol or use drugs.  Allergies:  Allergies  Allergen Reactions  . Mushroom Extract Complex Anaphylaxis    Made patient "deathly sick"  . Atenolol     "MD took me off of it bc it was doing something wrong" Made patient HYPOTENSIVE  . Ivp Dye [Iodinated Diagnostic Agents] Itching    Pt injected with IV contrast.  5 min after injection pt complained of itching behind ear and on her abd.  . Morphine And Related Other (See Comments)    "stops my breathing" NEAR-RESPIRATORY ARREST  . Percocet [Oxycodone-Acetaminophen] Nausea And Vomiting and Other (See Comments)     Stomach pains  . Percodan [Oxycodone-Aspirin] Nausea And Vomiting and Other (See Comments)    Stomach pains  . Verapamil Other (See Comments)    " MD told me this was screwing me up" MAKES PATIENT HYPOTENSIVE     (Not in a hospital admission)  Results for orders placed or performed during the hospital encounter of 09/16/15 (from the past 48 hour(s))  Glucose, capillary     Status: Abnormal   Collection Time: 09/16/15  3:26 PM  Result Value Ref Range   Glucose-Capillary 159 (H) 65 - 99 mg/dL  Protime-INR     Status: None   Collection Time: 09/16/15  3:28 PM  Result Value Ref Range   Prothrombin Time 13.2 11.4 - 15.2 seconds   INR 1.00   APTT     Status: None   Collection Time: 09/16/15  3:28 PM  Result Value Ref Range   aPTT 35 24 - 36 seconds  CBC     Status: Abnormal   Collection Time: 09/16/15  3:28 PM  Result Value Ref Range   WBC 7.4 3.6 - 11.0 K/uL   RBC 4.38 3.80 - 5.20 MIL/uL   Hemoglobin 11.7 (L) 12.0 - 16.0 g/dL   HCT 35.0 35.0 - 47.0 %   MCV 79.9 (L) 80.0 - 100.0  fL   MCH 26.8 26.0 - 34.0 pg   MCHC 33.5 32.0 - 36.0 g/dL   RDW 22.2 (H) 11.5 - 14.5 %   Platelets 249 150 - 440 K/uL  Differential     Status: None   Collection Time: 09/16/15  3:28 PM  Result Value Ref Range   Neutrophils Relative % 52 %   Neutro Abs 3.9 1.4 - 6.5 K/uL   Lymphocytes Relative 39 %   Lymphs Abs 2.9 1.0 - 3.6 K/uL   Monocytes Relative 7 %   Monocytes Absolute 0.5 0.2 - 0.9 K/uL   Eosinophils Relative 1 %   Eosinophils Absolute 0.1 0 - 0.7 K/uL   Basophils Relative 1 %   Basophils Absolute 0.1 0 - 0.1 K/uL  Comprehensive metabolic panel     Status: Abnormal   Collection Time: 09/16/15  3:28 PM  Result Value Ref Range   Sodium 140 135 - 145 mmol/L   Potassium 3.4 (L) 3.5 - 5.1 mmol/L   Chloride 108 101 - 111 mmol/L   CO2 25 22 - 32 mmol/L   Glucose, Bld 166 (H) 65 - 99 mg/dL   BUN 10 6 - 20 mg/dL   Creatinine, Ser 0.77 0.44 - 1.00 mg/dL   Calcium 8.8 (L) 8.9 - 10.3 mg/dL    Total Protein 7.2 6.5 - 8.1 g/dL   Albumin 4.0 3.5 - 5.0 g/dL   AST 25 15 - 41 U/L   ALT 16 14 - 54 U/L   Alkaline Phosphatase 85 38 - 126 U/L   Total Bilirubin 0.5 0.3 - 1.2 mg/dL   GFR calc non Af Amer >60 >60 mL/min   GFR calc Af Amer >60 >60 mL/min    Comment: (NOTE) The eGFR has been calculated using the CKD EPI equation. This calculation has not been validated in all clinical situations. eGFR's persistently <60 mL/min signify possible Chronic Kidney Disease.    Anion gap 7 5 - 15  Troponin I     Status: None   Collection Time: 09/16/15  3:28 PM  Result Value Ref Range   Troponin I <0.03 <0.03 ng/mL   Ct Angio Head W Or Wo Contrast  Result Date: 09/16/2015 CLINICAL DATA:  70 year old female code stroke. Right facial droop and right side weakness beginning at 1415 hours today. Left cerebellar infarct in May. Initial encounter. EXAM: CT ANGIOGRAPHY HEAD AND NECK TECHNIQUE: Multidetector CT imaging of the head and neck was performed using the standard protocol during bolus administration of intravenous contrast. Multiplanar CT image reconstructions and MIPs were obtained to evaluate the vascular anatomy. Carotid stenosis measurements (when applicable) are obtained utilizing NASCET criteria, using the distal internal carotid diameter as the denominator. CONTRAST:  75 mL Isovue 370 COMPARISON:  Head CT without contrast 1512 hours today. CTA head and neck 05/15/2015 FINDINGS: CTA NECK Skeleton: Osteopenia. Absent dentition. Stable visualized spine. No acute osseous abnormality identified. Mild paranasal sinus mucosal thickening has increased since the May CTA. Mastoids remain clear. Upper chest: Stable and negative lung apices. No superior mediastinal lymphadenopathy. Other neck: Negative thyroid. Larynx and pharynx are partially effaced today but otherwise appear stable. Negative parapharyngeal and retropharyngeal spaces. Negative sublingual space, fatty infiltrated submandibular glands and  parotid glands. Stable orbit and scalp soft tissues. No cervical lymphadenopathy. Aortic arch: 3 vessel arch configuration. Stable moderate arch calcified atherosclerosis. Stable great vessel artery origins. Right carotid system: Stable right CCA origin. Stable mild soft plaque at the right ICA origin without hemodynamically significant stenosis.  Tortuosity of the right ICA distal to the bulb appears increased with a kinked appearance as seen on series 10, image 27. Otherwise no cervical right ICA stenosis. Left carotid system: Mild soft and calcified plaque in the left CCA is stable without stenosis. Soft and calcified plaque at the left carotid bifurcation is stable without stenosis. Calcified plaque and tortuosity at the distal bulb is stable. Kinked appearance of the vessel distal to the bulb is stable. Vertebral arteries:Stable proximal subclavian arteries. Calcified plaque at both vertebral artery origins resulting in stable mild vertebral artery origins stenosis bilaterally. The right vertebral artery is dominant as before. No other vertebral artery stenosis to the skullbase. CTA HEAD Posterior circulation: Stable distal vertebral arteries without stenosis. The left functionally terminates in PICA. Dominant appearing right AICA origin is patent. Diminutive basilar artery with fetal type bilateral PCA origins. No basilar stenosis. SCA origins are patent. Bilateral PCA branches are stable and within normal limits. Anterior circulation: Both ICA siphons are patent. Mild calcified plaque bilaterally without ICA siphon stenosis. Ophthalmic and posterior communicating artery origins are normal. Patent carotid termini. Normal MCA and ACA origins. Non dominant right A1 segment. Anterior communicating artery and bilateral ACA branches are normal. Left MCA M1 segment, bifurcation, and left MCA branches are stable and normal. Right MCA M1 segment, bifurcation, and right MCA branches are stable and normal. Venous  sinuses: Patent. Anatomic variants: Dominant right vertebral artery. Fetal type bilateral PCA origins. Dominant left ACA A1 segment. Delayed phase: Stable subtle left cavernous sinus enhancing lesion thought on prior studies to reflect a small meningioma (series 12, image 16). Small calcified right parasagittal meningioma also is stable (series 12, image 26). No other abnormal intracranial enhancement. Stable gray-white matter differentiation throughout the brain. IMPRESSION: 1. Negative for emergent large vessel occlusion. 2. Stable CTA head and neck since May. Calcified atherosclerosis at the aortic arch, in the neck, and at the skullbase, as well as tortuous bilateral ICAs, but no hemodynamically significant stenosis. 3. Stable small chronic left cavernous sinus and right vertex parasagittal meningiomas. 4. Continued stable CT appearance of the brain. Electronically Signed   By: Genevie Ann M.D.   On: 09/16/2015 17:00   Ct Angio Neck W And/or Wo Contrast  Result Date: 09/16/2015 CLINICAL DATA:  70 year old female code stroke. Right facial droop and right side weakness beginning at 1415 hours today. Left cerebellar infarct in May. Initial encounter. EXAM: CT ANGIOGRAPHY HEAD AND NECK TECHNIQUE: Multidetector CT imaging of the head and neck was performed using the standard protocol during bolus administration of intravenous contrast. Multiplanar CT image reconstructions and MIPs were obtained to evaluate the vascular anatomy. Carotid stenosis measurements (when applicable) are obtained utilizing NASCET criteria, using the distal internal carotid diameter as the denominator. CONTRAST:  75 mL Isovue 370 COMPARISON:  Head CT without contrast 1512 hours today. CTA head and neck 05/15/2015 FINDINGS: CTA NECK Skeleton: Osteopenia. Absent dentition. Stable visualized spine. No acute osseous abnormality identified. Mild paranasal sinus mucosal thickening has increased since the May CTA. Mastoids remain clear. Upper chest:  Stable and negative lung apices. No superior mediastinal lymphadenopathy. Other neck: Negative thyroid. Larynx and pharynx are partially effaced today but otherwise appear stable. Negative parapharyngeal and retropharyngeal spaces. Negative sublingual space, fatty infiltrated submandibular glands and parotid glands. Stable orbit and scalp soft tissues. No cervical lymphadenopathy. Aortic arch: 3 vessel arch configuration. Stable moderate arch calcified atherosclerosis. Stable great vessel artery origins. Right carotid system: Stable right CCA origin. Stable mild soft plaque at  the right ICA origin without hemodynamically significant stenosis. Tortuosity of the right ICA distal to the bulb appears increased with a kinked appearance as seen on series 10, image 27. Otherwise no cervical right ICA stenosis. Left carotid system: Mild soft and calcified plaque in the left CCA is stable without stenosis. Soft and calcified plaque at the left carotid bifurcation is stable without stenosis. Calcified plaque and tortuosity at the distal bulb is stable. Kinked appearance of the vessel distal to the bulb is stable. Vertebral arteries:Stable proximal subclavian arteries. Calcified plaque at both vertebral artery origins resulting in stable mild vertebral artery origins stenosis bilaterally. The right vertebral artery is dominant as before. No other vertebral artery stenosis to the skullbase. CTA HEAD Posterior circulation: Stable distal vertebral arteries without stenosis. The left functionally terminates in PICA. Dominant appearing right AICA origin is patent. Diminutive basilar artery with fetal type bilateral PCA origins. No basilar stenosis. SCA origins are patent. Bilateral PCA branches are stable and within normal limits. Anterior circulation: Both ICA siphons are patent. Mild calcified plaque bilaterally without ICA siphon stenosis. Ophthalmic and posterior communicating artery origins are normal. Patent carotid termini.  Normal MCA and ACA origins. Non dominant right A1 segment. Anterior communicating artery and bilateral ACA branches are normal. Left MCA M1 segment, bifurcation, and left MCA branches are stable and normal. Right MCA M1 segment, bifurcation, and right MCA branches are stable and normal. Venous sinuses: Patent. Anatomic variants: Dominant right vertebral artery. Fetal type bilateral PCA origins. Dominant left ACA A1 segment. Delayed phase: Stable subtle left cavernous sinus enhancing lesion thought on prior studies to reflect a small meningioma (series 12, image 16). Small calcified right parasagittal meningioma also is stable (series 12, image 26). No other abnormal intracranial enhancement. Stable gray-white matter differentiation throughout the brain. IMPRESSION: 1. Negative for emergent large vessel occlusion. 2. Stable CTA head and neck since May. Calcified atherosclerosis at the aortic arch, in the neck, and at the skullbase, as well as tortuous bilateral ICAs, but no hemodynamically significant stenosis. 3. Stable small chronic left cavernous sinus and right vertex parasagittal meningiomas. 4. Continued stable CT appearance of the brain. Electronically Signed   By: Genevie Ann M.D.   On: 09/16/2015 17:00   Ct Head Code Stroke W/o Cm  Addendum Date: 09/16/2015   ADDENDUM REPORT: 09/16/2015 15:38 ADDENDUM: Study discussed by telephone with Dr. Quentin Cornwall on 09/16/2015 At 1528 hours. Electronically Signed   By: Genevie Ann M.D.   On: 09/16/2015 15:38   Result Date: 09/16/2015 CLINICAL DATA:  Code stroke. 70 year old female with right facial droop and right side weakness onset at 1415 hours today. Initial encounter. Left cerebellar infarcts in May. EXAM: CT HEAD WITHOUT CONTRAST TECHNIQUE: Contiguous axial images were obtained from the base of the skull through the vertex without intravenous contrast. COMPARISON:  Head CT 08/16/2015. CTA head and neck 05/15/2015. Brain MRI 05/14/2015. FINDINGS: Stable visible paranasal  sinuses and mastoids. Osteopenia. No acute osseous abnormality identified. No acute orbit or scalp soft tissue findings. Calcified atherosclerosis at the skull base. Patchy bilateral cerebral white matter hypodensity appears stable. Chronic left greater than right cerebellar lacunar infarcts appear stable. Stable ventricle size and configuration. No midline shift, mass effect, or evidence of intracranial mass lesion. No acute intracranial hemorrhage identified. No cortically based acute infarct identified. ASPECTS [facility] Stroke Program Early CT Score) Total score (0-10 with 10 being normal): 10. No suspicious intracranial vascular hyperdensity. I suspect there is a small right parasagittal 12 mm calcified meningioma  which is chronic and clinically silent. It appears stable since a brain MRI on 01/28/2013. IMPRESSION: 1. No acute cortically based infarct or acute intracranial hemorrhage identified. 2. ASPECTS is 10. 3. Chronic small vessel disease. Suspected small 12 mm chronic right parasagittal calcified meningioma which is likely inconsequential. Electronically Signed: By: Genevie Ann M.D. On: 09/16/2015 15:25    Review of Systems  Constitutional: Negative for fever.  HENT: Negative for hearing loss.   Eyes: Negative for blurred vision.  Respiratory: Negative for shortness of breath.   Cardiovascular: Negative for chest pain.  Gastrointestinal: Negative for nausea and vomiting.  Genitourinary: Negative for dysuria.  Musculoskeletal: Positive for back pain.  Skin: Negative for rash.  Neurological: Positive for sensory change and focal weakness.    Blood pressure (!) 158/76, pulse 69, temperature 97.9 F (36.6 C), resp. rate 13, weight 92.9 kg (204 lb 12.9 oz), SpO2 100 %. Physical Exam  Constitutional: She is oriented to person, place, and time. She appears well-developed and well-nourished. No distress.  HENT:  Head: Normocephalic and atraumatic.  Mouth/Throat: Oropharynx is clear and moist. No  oropharyngeal exudate.  Eyes: EOM are normal. Pupils are equal, round, and reactive to light. No scleral icterus.  Neck: Normal range of motion. Neck supple. No JVD present. No tracheal deviation present. No thyromegaly present.  Cardiovascular: Regular rhythm.   No murmur heard. Respiratory: Effort normal and breath sounds normal. No respiratory distress. She exhibits no tenderness.  GI: Soft. Bowel sounds are normal. She exhibits no distension and no mass.  Musculoskeletal: She exhibits no edema or tenderness.  Lymphadenopathy:    She has no cervical adenopathy.  Neurological: She is alert and oriented to person, place, and time. No cranial nerve deficit.  4/5 weakness in right lower extremity.     Assessment/Plan 1. CVA: Recurrent right sided weakness. CT and CTA are negative. Refused to consider TPA on this admission.Will restart medications including aggrenox. Get PT, OT and speech involved. MRI. Her work ups in the past have not totally explained her symptoms. However, she does have significant risk factors for CVA and should be on medication to lower her risk of recurrence.  2, Sinus Bradycardia: Seems to be asymptomatic. Monitor for now.  3. CAD: Check trop. Nothing acute on EKG.  4. COPD: Stable.  Time spent= 58mn  JBaxter Hire MD 09/16/2015, 5:56 PM

## 2015-09-16 NOTE — ED Triage Notes (Signed)
Patient brought in POV for right sided facial droop, headache, and right sided weakness. Patient had stroke 3-4 weeks ago and was treated at [facility].

## 2015-09-16 NOTE — ED Notes (Signed)
CT called to inform Dr. Clearnce Hasten that patient started c/o itching under abdomen and right ear itching. Dr. Clearnce Hasten gave verbal order for Benadryl 50 mg PO

## 2015-09-16 NOTE — ED Provider Notes (Signed)
[facility] Emergency Department Provider Note   ____________________________________________   First MD Initiated Contact with Patient 09/16/15 1510     (approximate)  I have reviewed the triage vital signs and the nursing notes.   HISTORY  Chief Complaint Code Stroke   HPI Samantha Aguirre is a 69 y.o. female with a history of CHF as well as seizure and CVA on Aggrenox who is presenting to the emergency department with right-sided weakness and numbness that started at 2 PM today. She says that she felt her legs starting to drag at about 2 PM when she was walking to see her daughter. She says that since then she has also experienced a right-sided facial droop and worsening right-sided weakness. She said that she was admitted 3 weeks ago at [facility] for a similar issue and had a CAT scan and was discharged on Aggrenox as well as other medications. However, she is only taking a limited amount of the medications because she says she was unable to fill the prescriptions due to insurance issues.She is also describing a right-sided 8 out of 10 headache.   Past Medical History:  Diagnosis Date  . Arthritis   . Asthma   . Cancer ([place])   . CHF (congestive heart failure) ([place])   . COPD (chronic obstructive pulmonary disease) ([place])   . Coronary artery disease   . Diabetes mellitus without complication ([place])   . HLD (hyperlipidemia)   . Hypertension   . MI (myocardial infarction) ([place])   . Seizures ([place])   . Skin cancer   . Stroke ([place])   . Tremors of nervous system     Patient Active Problem List   Diagnosis Date Noted  . CVA, old, facial weakness   . Right sided weakness 08/16/2015  . Other specified hypothyroidism 08/16/2015  . Diabetes mellitus with complication ([place]) [id number]  . COPD (chronic obstructive pulmonary disease) ([place]) 08/16/2015  . Tobacco abuse 08/16/2015  . HLD (hyperlipidemia) 08/16/2015  . History of CHF (congestive  heart failure) 08/16/2015  . Seizures ([place])     Past Surgical History:  Procedure Laterality Date  . ABDOMINAL HYSTERECTOMY    . APPENDECTOMY    . CHOLECYSTECTOMY    . KNEE SURGERY     left  . ROTATOR CUFF REPAIR     right    Prior to Admission medications   Medication Sig Start Date End Date Taking? Authorizing Provider  acetaminophen (TYLENOL) 325 MG tablet Take 650 mg by mouth at bedtime.    Historical Provider, MD  dipyridamole-aspirin (AGGRENOX) 200-25 MG 12hr capsule Take 1 capsule by mouth 2 (two) times daily. 08/19/15   Mauricio Gerome Apley, MD  DULoxetine (CYMBALTA) 60 MG capsule Take 60 mg by mouth at bedtime.    Historical Provider, MD  fluticasone furoate-vilanterol (BREO ELLIPTA) 100-25 MCG/INH AEPB Inhale 1 puff into the lungs daily. 08/19/15   Mauricio Gerome Apley, MD  gabapentin (NEURONTIN) 800 MG tablet Take 800 mg by mouth 2 (two) times daily.    Historical Provider, MD  glipiZIDE-metformin (METAGLIP) 5-500 MG tablet Take 1-2 tablets by mouth See admin instructions. 2 tablets in the morning and 1 tablet at night    Historical Provider, MD  levETIRAcetam (KEPPRA) 500 MG tablet Take 1 tablet (500 mg total) by mouth 2 (two) times daily. 08/19/15   Mauricio Gerome Apley, MD  levothyroxine (SYNTHROID, LEVOTHROID) 125 MCG tablet Take 1 tablet (125 mcg total) by mouth every other day. 08/19/15  Mauricio Gerome Apley, MD  nitroGLYCERIN (NITROSTAT) 0.4 MG SL tablet Place 0.4 mg under the tongue every 5 (five) minutes as needed for chest pain.    Historical Provider, MD  potassium chloride (K-DUR,KLOR-CON) 10 MEQ tablet Take 10 mEq by mouth daily.    Historical Provider, MD  rosuvastatin (CRESTOR) 40 MG tablet Take 1 tablet (40 mg total) by mouth daily. 08/19/15   Mauricio Gerome Apley, MD  umeclidinium bromide (INCRUSE ELLIPTA) 62.5 MCG/INH AEPB Inhale 1 puff into the lungs daily. 08/19/15   Mauricio Gerome Apley, MD    Allergies Mushroom extract complex; Atenolol; Morphine and  related; Percocet [oxycodone-acetaminophen]; Percodan [oxycodone-aspirin]; and Verapamil  Family History  Problem Relation Age of Onset  . Breast cancer Mother     Social History Social History  Substance Use Topics  . Smoking status: Current Every Day Smoker    Packs/day: 1.00    Types: Cigarettes  . Smokeless tobacco: Current User  . Alcohol use No    Review of Systems Constitutional: No fever/chills Eyes: No visual changes. ENT: No sore throat. Cardiovascular: Denies chest pain. Respiratory: Denies shortness of breath. Gastrointestinal: No abdominal pain.  No nausea, no vomiting.  No diarrhea.  No constipation. Genitourinary: Negative for dysuria. Musculoskeletal: Negative for back pain. Skin: Negative for rash. Neurological: As above  10-point ROS otherwise negative.  ____________________________________________   PHYSICAL EXAM:  VITAL SIGNS: ED Triage Vitals [09/16/15 1502]  Enc Vitals Group     BP (!) 185/88     Pulse Rate 69     Resp 16     Temp      Temp src      SpO2 100 %     Weight      Height      Head Circumference      Peak Flow      Pain Score      Pain Loc      Pain Edu?      Excl. in [place]?     Constitutional: Alert and oriented. In no acute distress Eyes: Conjunctivae are normal. PERRL. EOMI. Head: Atraumatic. Nose: No congestion/rhinnorhea. Mouth/Throat: Mucous membranes are moist.  Neck: No stridor.   Cardiovascular: Normal rate, regular rhythm. Grossly normal heart sounds.  Good peripheral circulation with equal and bilateral dorsalis pedis pulses. Respiratory: Normal respiratory effort.  No retractions. Lungs CTAB. Gastrointestinal: Soft and nontender. No distention.  Musculoskeletal: No lower extremity tenderness nor edema.  No joint effusions. Neurologic:  Normal speech and language. Mild right-sided facial droop which resolves when the patient smiles. Weak 4 out of 5 strength to the right upper and lower extremities with sensation  deficit to light touch which is mild when compared to the left. Skin:  Skin is warm, dry and intact. No rash noted. Psychiatric: Mood and affect are normal. Speech and behavior are normal.  NIH Stroke Scale   Time: 2:25 PM Person Administering Scale: Doran Stabler  Administer stroke scale items in the order listed. Record performance in each category after each subscale exam. Do not go back and change scores. Follow directions provided for each exam technique. Scores should reflect what the patient does, not what the clinician thinks the patient can do. The clinician should record answers while administering the exam and work quickly. Except where indicated, the patient should not be coached (i.e., repeated requests to patient to make a special effort).   1a  Level of consciousness: 0=alert; keenly responsive  1b. LOC questions:  0=Performs  both tasks correctly  1c. LOC commands: 0=Performs both tasks correctly  2.  Best Gaze: 0=normal  3.  Visual: 0=No visual loss  4. Facial Palsy: 1=Minor paralysis (flattened nasolabial fold, asymmetric on smiling)  5a.  Motor left arm: 0=No drift, limb holds 90 (or 45) degrees for full 10 seconds  5b.  Motor right arm: 1=Drift, limb holds 90 (or 45) degrees but drifts down before full 10 seconds: does not hit bed  6a. motor left leg: 0=No drift, limb holds 90 (or 45) degrees for full 10 seconds  6b  Motor right leg:  1=Drift, limb holds 90 (or 45) degrees but drifts down before full 10 seconds: does not hit bed  7. Limb Ataxia: 0=Absent  8.  Sensory: 1=Mild to moderate sensory loss; patient feels pinprick is less sharp or is dull on the affected side; there is a loss of superficial pain with pinprick but patient is aware She is being touched  9. Best Language:  0=No aphasia, normal  10. Dysarthria: 0=Normal  11. Extinction and Inattention: 0=No abnormality  12. Distal motor function: 0=Normal   Total:   4    ____________________________________________   LABS (all labs ordered are listed, but only abnormal results are displayed)  Labs Reviewed  PROTIME-INR  APTT  CBC  DIFFERENTIAL  COMPREHENSIVE METABOLIC PANEL  TROPONIN I  CBG MONITORING, ED   ____________________________________________  EKG  ED ECG REPORT I, [place] Attar,  Youlanda Roys, the attending physician, personally viewed and interpreted this ECG.   Date: 09/16/2015  EKG Time: 1535  Rate: 54  Rhythm: normal sinus rhythm  Axis: Normal axis  Intervals:none  ST&T Change: No ST segment elevation or depression. No abnormal T-wave inversion.    ____________________________________________  RADIOLOGY  Final result by Gaspar Cola, MD (09/16/15 15:25:26)           Narrative:   CLINICAL DATA: Code stroke. 70 year old female with right facial droop and right side weakness onset at 1415 hours today. Initial encounter. Left cerebellar infarcts in May.  EXAM: CT HEAD WITHOUT CONTRAST  TECHNIQUE: Contiguous axial images were obtained from the base of the skull through the vertex without intravenous contrast.  COMPARISON: Head CT 08/16/2015. CTA head and neck 05/15/2015. Brain MRI 05/14/2015.  FINDINGS: Stable visible paranasal sinuses and mastoids. Osteopenia. No acute osseous abnormality identified.  No acute orbit or scalp soft tissue findings.  Calcified atherosclerosis at the skull base. Patchy bilateral cerebral white matter hypodensity appears stable. Chronic left greater than right cerebellar lacunar infarcts appear stable. Stable ventricle size and configuration. No midline shift, mass effect, or evidence of intracranial mass lesion.  No acute intracranial hemorrhage identified. No cortically based acute infarct identified.  ASPECTS [facility] Stroke Program Early CT Score)  Total score (0-10 with 10 being normal): 10.  No suspicious intracranial vascular hyperdensity.  I suspect there is a small  right parasagittal 12 mm calcified meningioma which is chronic and clinically silent. It appears stable since a brain MRI on 01/28/2013.  IMPRESSION: 1. No acute cortically based infarct or acute intracranial hemorrhage identified. 2. ASPECTS is 10. 3. Chronic small vessel disease. Suspected small 12 mm chronic right parasagittal calcified meningioma which is likely inconsequential.   Electronically Signed By: Genevie Ann M.D. On: 09/16/2015 15:25         CT Angio Head W or Wo Contrast (Accession [id number]) (Order [id number])  Imaging  Date: 09/16/2015 Department: [facility] EMERGENCY DEPARTMENT Released By/Authorizing: Orbie Pyo, MD (auto-released)  PACS Images  Show images for CT Angio Head W or Wo Contrast  Study Result   CLINICAL DATA:  70 year old female code stroke. Right facial droop and right side weakness beginning at 1415 hours today. Left cerebellar infarct in May. Initial encounter.  EXAM: CT ANGIOGRAPHY HEAD AND NECK  TECHNIQUE: Multidetector CT imaging of the head and neck was performed using the standard protocol during bolus administration of intravenous contrast. Multiplanar CT image reconstructions and MIPs were obtained to evaluate the vascular anatomy. Carotid stenosis measurements (when applicable) are obtained utilizing NASCET criteria, using the distal internal carotid diameter as the denominator.  CONTRAST:  75 mL Isovue 370  COMPARISON:  Head CT without contrast 1512 hours today. CTA head and neck 05/15/2015  FINDINGS: CTA NECK  Skeleton: Osteopenia. Absent dentition. Stable visualized spine. No acute osseous abnormality identified.  Mild paranasal sinus mucosal thickening has increased since the May CTA. Mastoids remain clear.  Upper chest: Stable and negative lung apices. No superior mediastinal lymphadenopathy.  Other neck: Negative thyroid. Larynx and pharynx are partially effaced  today but otherwise appear stable. Negative parapharyngeal and retropharyngeal spaces. Negative sublingual space, fatty infiltrated submandibular glands and parotid glands. Stable orbit and scalp soft tissues. No cervical lymphadenopathy.  Aortic arch: 3 vessel arch configuration. Stable moderate arch calcified atherosclerosis. Stable great vessel artery origins.  Right carotid system: Stable right CCA origin. Stable mild soft plaque at the right ICA origin without hemodynamically significant stenosis. Tortuosity of the right ICA distal to the bulb appears increased with a kinked appearance as seen on series 10, image 27. Otherwise no cervical right ICA stenosis.  Left carotid system: Mild soft and calcified plaque in the left CCA is stable without stenosis. Soft and calcified plaque at the left carotid bifurcation is stable without stenosis. Calcified plaque and tortuosity at the distal bulb is stable. Kinked appearance of the vessel distal to the bulb is stable.  Vertebral arteries:Stable proximal subclavian arteries. Calcified plaque at both vertebral artery origins resulting in stable mild vertebral artery origins stenosis bilaterally. The right vertebral artery is dominant as before. No other vertebral artery stenosis to the skullbase.  CTA HEAD  Posterior circulation: Stable distal vertebral arteries without stenosis. The left functionally terminates in PICA. Dominant appearing right AICA origin is patent. Diminutive basilar artery with fetal type bilateral PCA origins. No basilar stenosis. SCA origins are patent. Bilateral PCA branches are stable and within normal limits.  Anterior circulation: Both ICA siphons are patent. Mild calcified plaque bilaterally without ICA siphon stenosis. Ophthalmic and posterior communicating artery origins are normal. Patent carotid termini. Normal MCA and ACA origins. Non dominant right A1 segment. Anterior communicating artery and  bilateral ACA branches are normal. Left MCA M1 segment, bifurcation, and left MCA branches are stable and normal. Right MCA M1 segment, bifurcation, and right MCA branches are stable and normal.  Venous sinuses: Patent.  Anatomic variants: Dominant right vertebral artery. Fetal type bilateral PCA origins. Dominant left ACA A1 segment.  Delayed phase: Stable subtle left cavernous sinus enhancing lesion thought on prior studies to reflect a small meningioma (series 12, image 16). Small calcified right parasagittal meningioma also is stable (series 12, image 26). No other abnormal intracranial enhancement. Stable gray-white matter differentiation throughout the brain.  IMPRESSION: 1. Negative for emergent large vessel occlusion. 2. Stable CTA head and neck since May. Calcified atherosclerosis at the aortic arch, in the neck, and at the skullbase, as well as tortuous bilateral ICAs, but no hemodynamically significant stenosis. 3. Stable small chronic  left cavernous sinus and right vertex parasagittal meningiomas. 4. Continued stable CT appearance of the brain.   Electronically Signed   By: Genevie Ann M.D.   On: 09/16/2015 17:00    ____________________________________________   PROCEDURES  Procedure(s) performed:   Procedures  Critical Care performed:   ____________________________________________   INITIAL IMPRESSION / ASSESSMENT AND PLAN / ED COURSE  Pertinent labs & imaging results that were available during my care of the patient were reviewed by me and considered in my medical decision making (see chart for details).  ----------------------------------------- 3:53 PM on 09/16/2015 -----------------------------------------  After lengthy discussion regarding the risks and benefits of TPA and the patient does not want TPA at this time. She says that she never fully recovered from previous stroke and says that she is only about 90% recovered. It is unclear  exactly how much Weakness there is there is new this time around when compared to last time and the patient feels that the risk the tPA does not outweigh the benefit. She'll be given aspirin. We will do a CTA of the head and neck to make sure there is no obvious need for rapid intervention at this time. Otherwise, she will be admitted to the hospital for further workup and treatment. The patient is understanding of this plan and willing to comply.  Clinical Course   ----------------------------------------- 5:17 PM on 09/16/2015 -----------------------------------------  Patient says that she feels more strength in her face and that she does not feel like her face is drooping. When I observe and I also do not observe any facial droop at this time. Reassuring CTA. We will proceed with admission to the hospital. Signed out to Dr. Lavetta Nielsen. Patient also had itching after her CAT scan but was given Benadryl and now the symptoms are completely resolved. She never developed a rash or any difficulty with breathing.  ____________________________________________   FINAL CLINICAL IMPRESSION(S) / ED DIAGNOSES  CVA    NEW MEDICATIONS STARTED DURING THIS VISIT:  New Prescriptions   No medications on file     Note:  This document was prepared using Dragon voice recognition software and may include unintentional dictation errors.    Orbie Pyo, MD 09/16/15 [phone]

## 2015-09-16 NOTE — ED Notes (Signed)
Patient states that she has hx/o CVA, most recent 3-4 weeks ago. Patient was seen and treated at [facility]. Patient states that she did not receive TPA at that time. Patient states that she had some right sided residual weakness but today the weakness seemed worse. Patient states that she got up to go outside and talk to her daughter and that she had to "shuffle" her feet. Patient states that the weakness in her right leg feels worse and that she is having numbness and tingling in her right chest and shoulder. Patient states that her symptoms started about 1415 today.

## 2015-09-17 LAB — GLUCOSE, CAPILLARY
GLUCOSE-CAPILLARY: 142 mg/dL — AB (ref 65–99)
GLUCOSE-CAPILLARY: 83 mg/dL (ref 65–99)
GLUCOSE-CAPILLARY: 118 mg/dL — AB (ref 65–99)
Glucose-Capillary: 153 mg/dL — ABNORMAL HIGH (ref 65–99)

## 2015-09-17 LAB — TROPONIN I
Troponin I: <0.03 ng/mL (ref <0.03)
Troponin I: <0.03 ng/mL (ref <0.03)

## 2015-09-17 MED ORDER — ACETAMINOPHEN 325 MG PO TABS
650.0000 mg | ORAL_TABLET | Freq: Four times a day (QID) | ORAL | Status: DC | PRN
  Administered 2015-09-17 – 2015-09-19 (×4): 650 mg via ORAL
  Filled 2015-09-17 (×2): qty 2

## 2015-09-17 MED ORDER — LORAZEPAM 2 MG/ML IJ SOLN
2.0000 mg | Freq: Once | INTRAMUSCULAR | Status: AC
  Administered 2015-09-18: 09:00:00 2 mg via INTRAVENOUS
  Filled 2015-09-17: qty 1

## 2015-09-17 NOTE — NC FL2 (Signed)
[place] LEVEL OF CARE SCREENING TOOL     IDENTIFICATION  Patient Name: Samantha Aguirre Birthdate: 09/27/1945 Sex: female Admission Date (Current Location): 09/16/2015  [place] and [place] Number:  Engineering geologist and Address:  [facility], [address] [place], [place] [address]      Provider Number: [id number]  Attending Physician Name and Address:  Dustin Flock, MD  Relative Name and Phone Number:       Current Level of Care: Hospital Recommended Level of Care: [place] Prior Approval Number:    Date Approved/Denied:   PASRR Number:  ([id number])  Discharge Plan: SNF    Current Diagnoses: Patient Active Problem List   Diagnosis Date Noted  . CVA (cerebral infarction) 09/16/2015  . CVA, old, facial weakness   . Right sided weakness 08/16/2015  . Other specified hypothyroidism 08/16/2015  . Diabetes mellitus with complication ([place]) [id number]  . COPD (chronic obstructive pulmonary disease) ([place]) 08/16/2015  . Tobacco abuse 08/16/2015  . HLD (hyperlipidemia) 08/16/2015  . History of CHF (congestive heart failure) 08/16/2015  . Seizures ([place])     Orientation RESPIRATION BLADDER Height & Weight     Self, Time, Situation, Place  Normal Continent Weight: 195 lb 3.2 oz (88.5 kg) Height:  5\' 1"  (154.9 cm)  BEHAVIORAL SYMPTOMS/MOOD NEUROLOGICAL BOWEL NUTRITION STATUS   (None) Convulsions/Seizures Continent Diet (Carb Modified )  AMBULATORY STATUS COMMUNICATION OF NEEDS Skin   Extensive Assist Verbally Normal                       Personal Care Assistance Level of Assistance  Bathing, Feeding, Dressing Bathing Assistance: Limited assistance Feeding assistance: Independent Dressing Assistance: Limited assistance     Functional Limitations Info  Sight, Hearing, Speech Sight Info: Adequate Hearing Info: Adequate Speech Info: Adequate    SPECIAL CARE FACTORS FREQUENCY  PT (By licensed PT),  OT (By licensed OT)     PT Frequency:  (5) OT Frequency:  (5)            Contractures      Additional Factors Info  Code Status, Allergies, Insulin Sliding Scale Code Status Info:  (Full Code) Allergies Info: Mushroom Extract Complex, Atenolol, Ivp Dye Iodinated Diagnostic Agents, Morphine And Related, Percocet Oxycodone-acetaminophen, Percodan Oxycodone-aspirin, Verapamil   Insulin Sliding Scale Info: insulin aspart (novoLOG) injection 0-5 Units 0-5 Units, Subcutaneous, Daily at bedtime  ; insulin aspart (novoLOG) injection 0-9 Units 0-9 Units, Subcutaneous, 3 times daily with meals         Current Medications (09/17/2015):  This is the current hospital active medication list Current Facility-Administered Medications  Medication Dose Route Frequency Provider Last Rate Last Dose  . 0.9 %  sodium chloride infusion  250 mL Intravenous PRN Baxter Hire, MD      . acetaminophen (TYLENOL) tablet 650 mg  650 mg Oral QHS Baxter Hire, MD   650 mg at 09/16/15 2027  . acetaminophen (TYLENOL) tablet 650 mg  650 mg Oral Q6H PRN Saundra Shelling, MD   650 mg at 09/17/15 0634  . dipyridamole-aspirin (AGGRENOX) 200-25 MG per 12 hr capsule 1 capsule  1 capsule Oral BID Baxter Hire, MD   1 capsule at 09/17/15 [phone]  . DULoxetine (CYMBALTA) DR capsule 60 mg  60 mg Oral QHS Baxter Hire, MD   60 mg at 09/16/15 2307  . enoxaparin (LOVENOX) injection 40 mg  40 mg Subcutaneous Q24H Baxter Hire, MD  40 mg at 09/16/15 2021  . fluticasone furoate-vilanterol (BREO ELLIPTA) 100-25 MCG/INH 1 puff  1 puff Inhalation Daily Baxter Hire, MD   1 puff at 09/17/15 [phone]  . gabapentin (NEURONTIN) capsule 800 mg  800 mg Oral BID Baxter Hire, MD   800 mg at 09/17/15 [id number]  . glipiZIDE (GLUCOTROL) tablet 5 mg  5 mg Oral BID WC Baxter Hire, MD   5 mg at 09/17/15 [id number]   And  . metFORMIN (GLUCOPHAGE) tablet 500 mg  500 mg Oral BID WC Baxter Hire, MD   500 mg at 09/17/15 [id number]  . insulin aspart  (novoLOG) injection 0-5 Units  0-5 Units Subcutaneous QHS Baxter Hire, MD      . insulin aspart (novoLOG) injection 0-9 Units  0-9 Units Subcutaneous TID WC Baxter Hire, MD   1 Units at 09/17/15 1211  . levETIRAcetam (KEPPRA) tablet 500 mg  500 mg Oral BID Baxter Hire, MD   500 mg at 09/17/15 [id number]  . levothyroxine (SYNTHROID, LEVOTHROID) tablet 125 mcg  125 mcg Oral QODAY Baxter Hire, MD   125 mcg at 09/17/15 [phone]  . LORazepam (ATIVAN) injection 2 mg  2 mg Intravenous Once Dustin Flock, MD      . potassium chloride (K-DUR,KLOR-CON) CR tablet 10 mEq  10 mEq Oral Daily Baxter Hire, MD   10 mEq at 09/17/15 [id number]  . rosuvastatin (CRESTOR) tablet 40 mg  40 mg Oral Daily Baxter Hire, MD   40 mg at 09/17/15 0835  . sodium chloride flush (NS) 0.9 % injection 3 mL  3 mL Intravenous Q12H Baxter Hire, MD   3 mL at 09/17/15 0840  . sodium chloride flush (NS) 0.9 % injection 3 mL  3 mL Intravenous Q12H Baxter Hire, MD   3 mL at 09/17/15 0839  . sodium chloride flush (NS) 0.9 % injection 3 mL  3 mL Intravenous PRN Baxter Hire, MD      . umeclidinium bromide (INCRUSE ELLIPTA) 62.5 MCG/INH 1 puff  1 puff Inhalation Daily Baxter Hire, MD   1 puff at 09/17/15 [id number]     Discharge Medications: Please see discharge summary for a list of discharge medications.  Relevant Imaging Results:  Relevant Lab Results:   Additional Information  (SSN [id number])  Lorenso Quarry Kambra Beachem, LCSW

## 2015-09-17 NOTE — Care Management (Signed)
Admitted to [facility] with the diagnosis of CVA. Lives with daughter, Josiana Hugel. [phone]). Just moved from [facility] and is living with daughter now. "My sister kicked me out in [employer]." "I am looking for assisted living here." List of assisted living facilities given. A patient at [facility] facility 08/16/15. No primary care physician to follow-up, so she had no more medications to take. Will have a primary care physician ob [place] starting Tuesday. "They said they would accept me when this new physician came to the clinic." Prescriptions are filled at [facility] on [address], Takes care of all basic activities of daily living herself, hasn't driven in 10 years, Home health through [place] in the past. ( 6 months ago)  No skilled facility. No home oxygen. Uses a cane, rolling  Walker, and power wheelchair. Daughter helps with errands. Last fall was prior to being admitted to [facility]. "Not too good appetite."  Shelbie Ammons RN MSN [place] [facility] [phone]

## 2015-09-17 NOTE — Evaluation (Signed)
Physical Therapy Evaluation Patient Details Name: Samantha Aguirre MRN: [id number] DOB: 1945/11/19 Today's Date: 09/17/2015   History of Present Illness  presented to ER secondary to recurrent R-sided weakness; admitted with acute CVA.  CT head negative for acute change; CT angiography head/neck negative for large vessel occlusion.  MRI pending.  Of note, patient recently hospitalized at [facility] for CVA with R-sided weakness; discharged home on aggrenox, but has now run out and has not been taking.  Clinical Impression  Upon evaluation, patient alert and oriented; follows all commands and demonstrates fair/good insight.  Mild weakness noted R UE > LE with isolated testing; however, some degree of give-way weakness appreciated.  Isolated strength testing somewhat inconsistent with functional performance.  Able to complete bed mobility with mod indep; sit/stand, basic transfers and gait (220') with RW, cga.  Reports feeling of dizziness/unsteadiness during mobility trials, but no overt buckling or LOB noted.  Fluidity and overall gait mechanics improve/normalize throughout distance; however, patient unwilling to attempt without RW at this time due to subjective unsteadiness. Would benefit from skilled PT to address above deficits and promote optimal return to PLOF; Recommend transition to [place] upon discharge from acute hospitalization.     Follow Up Recommendations Home health PT    Equipment Recommendations       Recommendations for Other Services       Precautions / Restrictions Precautions Precautions: Fall Restrictions Weight Bearing Restrictions: No      Mobility  Bed Mobility Overal bed mobility: Modified Independent                Transfers Overall transfer level: Needs assistance Equipment used: Rolling walker (2 wheeled) Transfers: Sit to/from Stand Sit to Stand: Min assist            Ambulation/Gait Ambulation/Gait assistance: Min assist Ambulation Distance  (Feet): 220 Feet Assistive device: Rolling walker (2 wheeled)       General Gait Details: step to progressing to reciprocal stepping pattern as distance increases; mild weight shift to L LE throughout.  single episode of reported onset of dizziness and "feeling like I'm going to lose my balance" (with patient stopping, reaching for railing), but no external LOB noted.  Stairs            Wheelchair Mobility    Modified Rankin (Stroke Patients Only)       Balance Overall balance assessment: Needs assistance Sitting-balance support: Feet supported;No upper extremity supported Sitting balance-Leahy Scale: Good     Standing balance support: Bilateral upper extremity supported Standing balance-Leahy Scale: Fair                               Pertinent Vitals/Pain Pain Assessment: No/denies pain Pain Score: 6  Pain Location: right shoulder with AROM Pain Intervention(s): Limited activity within patient's tolerance    Home Living Family/patient expects to be discharged to:: Private residence Living Arrangements: Children (daughter) Available Help at Discharge: Family Type of Home: Mobile home Home Access: Stairs to enter Entrance Stairs-Rails: None Entrance Stairs-Number of Steps: 5 Home Layout: One level Home Equipment: Environmental consultant - 2 wheels;Cane - single point;Electric scooter      Prior Function Level of Independence: Needs assistance         Comments: Furniture walker at baseline. Can walk longer distances holding onto cart at [place]. Does not drive. Gets assist at times for bathing. Daughter works 1 day/week and watches grandchildren other days.  Hand Dominance   Dominant Hand: Right    Extremity/Trunk Assessment   Upper Extremity Assessment: RUE deficits/detail RUE Deficits / Details: Right shoulder strength 2+/5 shoulder flexion, abduction, elbow flexion, extension 3+/5, wrist flexion extension 3+/5. Left WFL, Grip strength: Right: 22#,  Left: 30#.         Lower Extremity Assessment:  (bilat LEs grossly 4+/5 throughout; denies sensory changes with proprioception intact.  Mild give-way weakness noted intermittently throughout evaluation.)         Communication   Communication: No difficulties  Cognition Arousal/Alertness: Awake/alert Behavior During Therapy: WFL for tasks assessed/performed Overall Cognitive Status: Within Functional Limits for tasks assessed                      General Comments      Exercises Other Exercises Other Exercises: Toilet transfer, ambulatory with RW, cga; sit/stand from standard toilet with grab bars, cga; hygiene (with R UE), mod indep; standing balance with RW, cga/close sup.      Assessment/Plan    PT Assessment Patient needs continued PT services  PT Diagnosis Generalized weakness;Difficulty walking   PT Problem List Decreased activity tolerance;Decreased balance;Decreased mobility;Decreased cognition;Decreased knowledge of precautions;Obesity  PT Treatment Interventions DME instruction;Stair training;Functional mobility training;Gait training;Therapeutic activities;Therapeutic exercise;Patient/family education;Balance training   PT Goals (Current goals can be found in the Care Plan section) Acute Rehab PT Goals Patient Stated Goal: to return home PT Goal Formulation: With patient Time For Goal Achievement: 10/01/15 Potential to Achieve Goals: Good    Frequency 7X/week   Barriers to discharge Decreased caregiver support      Co-evaluation               End of Session Equipment Utilized During Treatment: Gait belt Activity Tolerance: Patient tolerated treatment well Patient left: with call bell/phone within reach;in chair;with chair alarm set Nurse Communication: Mobility status    Functional Assessment Tool Used: clinical judgement Functional Limitation: Mobility: Walking and moving around Mobility: Walking and Moving Around Current Status  [phone]): At least 1 percent but less than 20 percent impaired, limited or restricted Mobility: Walking and Moving Around Goal Status [phone]): 0 percent impaired, limited or restricted    Time: [id number] PT Time Calculation (min) (ACUTE ONLY): 31 min   Charges:   PT Evaluation $PT Eval Low Complexity: 1 Procedure PT Treatments $Therapeutic Activity: 8-22 mins   PT G Codes:   PT G-Codes **NOT FOR INPATIENT CLASS** Functional Assessment Tool Used: clinical judgement Functional Limitation: Mobility: Walking and moving around Mobility: Walking and Moving Around Current Status [id number]): At least 1 percent but less than 20 percent impaired, limited or restricted Mobility: Walking and Moving Around Goal Status [phone]): 0 percent impaired, limited or restricted    Alleta Avery H. Owens Shark, PT, DPT, NCS 09/17/15, 3:29 PM [phone]

## 2015-09-17 NOTE — Evaluation (Signed)
Occupational Therapy Evaluation Patient Details Name: Samantha Aguirre MRN: [id number] DOB: 13-Apr-1945 Today's Date: 09/17/2015    History of Present Illness Pt. is a 70 y.o. female who was admitted to [facility] with a CVA, right sided weakness. Pt. has a history of CVA, MI, HTN, HLD, DM, CAD, COPD, and CHF.   Clinical Impression   Pt. Is a 70 y.o. Female who was admitted to [facility] with a CVA, and right sided weakness. Pt. Presents with pain, limited AROM, RUE weakness, and decreased activity tolerance which hinder her ability to complete ADL, and IADL funtioning. Pt. Sensation, and proprioceptive awareness are intact. Pt. Could benefit from skilled OT services for ADL and A/E training, functional mobility, UE there. Ex, and pt. Education about DME, positioning, and home modification in order to improve ADL performance, and return to PLOF.    Follow Up Recommendations  SNF    Equipment Recommendations       Recommendations for Other Services PT consult     Precautions / Restrictions                                                       ADL Overall ADL's : Needs assistance/impaired Eating/Feeding: Set up;Minimal assistance   Grooming: Set up;Minimal assistance               Lower Body Dressing: Maximal assistance                 General ADL Comments: Pt. education was provided about positioning for the right UE.     Vision     Perception     Praxis      Pertinent Vitals/Pain Pain Assessment: 0-10 Pain Score: 6  Pain Location: right shoulder with AROM Pain Intervention(s): Limited activity within patient's tolerance     Hand Dominance Right   Extremity/Trunk Assessment Upper Extremity Assessment Upper Extremity Assessment: RUE deficits/detail RUE Deficits / Details: Right shoulder strength 2+/5 shoulder flexion, abduction, elbow flexion, extension 3+/5, wrist flexion extension 3+/5. Left WFL, Grip strength: Right: 22#, Left: 30#.           Communication Communication Communication: No difficulties   Cognition Arousal/Alertness: Awake/alert Behavior During Therapy: WFL for tasks assessed/performed Overall Cognitive Status: Within Functional Limits for tasks assessed                     General Comments       Exercises       Shoulder Instructions      Home Living Family/patient expects to be discharged to:: Private residence Living Arrangements: Alone Available Help at Discharge: Family Type of Home: House Home Access: Stairs to enter Technical brewer of Steps: [place]: One level     Bathroom Shower/Tub: Tub/shower unit Shower/tub characteristics: Curtain Biochemist, clinical: [place]: Environmental consultant - 2 wheels;Cane - single point;Electric scooter          Prior Functioning/Environment Level of Independence: Needs assistance             OT Diagnosis: Generalized weakness   OT Problem List: Decreased strength;Pain;Decreased knowledge of use of DME or AE;Decreased activity tolerance   OT Treatment/Interventions: Self-care/ADL training;Therapeutic exercise;Patient/family education;Therapeutic activities;Energy conservation    OT Goals(Current goals can be found in the care plan section) Acute Rehab OT Goals  Patient Stated Goal: To regain independence OT Goal Formulation: With patient Potential to Achieve Goals: Good  OT Frequency: Min 1X/week   Barriers to D/C:            Co-evaluation              End of Session    Activity Tolerance: Patient tolerated treatment well Patient left: in bed;with call bell/phone within reach;with bed alarm set;with nursing/sitter in room   Time: [id number] OT Time Calculation (min): 25 min Charges:  OT General Charges $OT Visit: 1 Procedure OT Evaluation $OT Eval Moderate Complexity: 1 Procedure G-Codes:    Harrel Carina 2015-09-30, 12:15 PM

## 2015-09-17 NOTE — Progress Notes (Signed)
Speech Therapy Note: received order, reviewed chart notes. Consulted NSG then pt. Pt denied any difficulty swallowing and is currently on a regular diet; tolerates swallowing pills w/ water per NSG. Pt noted that she ate entire breakfast meal with no concerns. Pt conversed at conversational level w/out deficits noted; pt denied any speech-language deficits. No further skilled ST services indicated as pt appears at her baseline. Pt agreed, noted she had no further questions regarding her speech-language or swallowing. NSG to reconsult if any change in status.

## 2015-09-17 NOTE — Progress Notes (Signed)
CSW reviewed PT note. PT is recommending HH. RNCM aware. CSW is signing off but is available if a need were to arise.  Ernest Pine, MSW, LCSW, [place] Clinical Social Worker [phone]

## 2015-09-17 NOTE — Progress Notes (Signed)
CSW received consult for discharge planning and possible ALF placement. PT and OT evaluations are pending. Awaiting their evaluations to determine what level of care patient will need at discharge. CSW will continue to follow and assist.  Ernest Pine, MSW, LCSW, [place] Social Worker [phone]

## 2015-09-17 NOTE — Progress Notes (Addendum)
[place] at [facility]                                                                                                                                                                                            Patient Demographics   Samantha Aguirre, is a 70 y.o. female, DOB - [birth year], [id number]  Admit date - 09/16/2015   Admitting Physician Baxter Hire, MD  Outpatient Primary MD for the patient is No primary care provider on file.   LOS - 1  Subjective:patient presented with right sided weakness, was hospitalized at [facility] last month With the same complaints. At that time MRI of the brain was negative. Patient states that she still having right-sided weakness     Review of Systems:   CONSTITUTIONAL: No documented fever. No fatigue, weakness. No weight gain, no weight loss.  EYES: No blurry or double vision.  ENT: No tinnitus. No postnasal drip. No redness of the oropharynx.  RESPIRATORY: No cough, no wheeze, no hemoptysis. No dyspnea.  CARDIOVASCULAR: No chest pain. No orthopnea. No palpitations. No syncope.  GASTROINTESTINAL: No nausea, no vomiting or diarrhea. No abdominal pain. No melena or hematochezia.  GENITOURINARY: No dysuria or hematuria.  ENDOCRINE: No polyuria or nocturia. No heat or cold intolerance.  HEMATOLOGY: No anemia. No bruising. No bleeding.  INTEGUMENTARY: No rashes. No lesions.  MUSCULOSKELETAL: No arthritis. No swelling. No gout.  NEUROLOGIC: No numbness, tingling, or ataxia. No seizure-type activity. Right-sided weakness PSYCHIATRIC: No anxiety. No insomnia. No ADD.    Vitals:   Vitals:   09/17/15 0204 09/17/15 0401 09/17/15 0603 09/17/15 0916  BP: (!) 106/57 (!) 102/56 118/63 116/62  Pulse: (!) 54 (!) 49 (!) 49 (!) 51  Resp:    20  Temp: 98.2 F (36.8 C) 97.8 F (36.6 C) 98 F (36.7 C) 97.6 F (36.4 C)  TempSrc: Oral Oral Oral Oral  SpO2: 95% 95% 97% 95%  Weight:  195 lb 3.2 oz (88.5 kg)     Height:  5\' 1"  (1.549 m)      Wt Readings from Last 3 Encounters:  09/17/15 195 lb 3.2 oz (88.5 kg)  08/18/15 187 lb 4.8 oz (85 kg)     Intake/Output Summary (Last 24 hours) at 09/17/15 1114 Last data filed at 09/17/15 0900  Gross per 24 hour  Intake              240 ml  Output                0 ml  Net  240 ml    Physical Exam:   GENERAL: Pleasant-appearing in no apparent distress.  HEAD, EYES, EARS, NOSE AND THROAT: Atraumatic, normocephalic. Extraocular muscles are intact. Pupils equal and reactive to light. Sclerae anicteric. No conjunctival injection. No oro-pharyngeal erythema.  NECK: Supple. There is no jugular venous distention. No bruits, no lymphadenopathy, no thyromegaly.  HEART: Regular rate and rhythm,. No murmurs, no rubs, no clicks.  LUNGS: Clear to auscultation bilaterally. No rales or rhonchi. No wheezes.  ABDOMEN: Soft, flat, nontender, nondistended. Has good bowel sounds. No hepatosplenomegaly appreciated.  EXTREMITIES: No evidence of any cyanosis, clubbing, or peripheral edema.  +2 pedal and radial pulses bilaterally.  NEUROLOGIC: The patient is alert, awake, and oriented x3 with no focal motor or sensory deficits appreciated bilaterally. Right upper extremity 4 out of 5 in right lower extremity 4 out of 5 SKIN: Moist and warm with no rashes appreciated.  Psych: Not anxious, depressed LN: No inguinal LN enlargement    Antibiotics   Anti-infectives    None      Medications   Scheduled Meds: . acetaminophen  650 mg Oral QHS  . dipyridamole-aspirin  1 capsule Oral BID  . DULoxetine  60 mg Oral QHS  . enoxaparin (LOVENOX) injection  40 mg Subcutaneous Q24H  . fluticasone furoate-vilanterol  1 puff Inhalation Daily  . gabapentin  800 mg Oral BID  . glipiZIDE  5 mg Oral BID WC   And  . metFORMIN  500 mg Oral BID WC  . insulin aspart  0-5 Units Subcutaneous QHS  . insulin aspart  0-9 Units Subcutaneous TID WC  . levETIRAcetam  500 mg Oral  BID  . levothyroxine  125 mcg Oral QODAY  . LORazepam  2 mg Intravenous Once  . potassium chloride  10 mEq Oral Daily  . rosuvastatin  40 mg Oral Daily  . sodium chloride flush  3 mL Intravenous Q12H  . sodium chloride flush  3 mL Intravenous Q12H  . umeclidinium bromide  1 puff Inhalation Daily   Continuous Infusions:  PRN Meds:.sodium chloride, acetaminophen, sodium chloride flush   Data Review:   Micro Results No results found for this or any previous visit (from the past 240 hour(s)).  Radiology Reports Ct Angio Head W Or Wo Contrast  Result Date: 09/16/2015 CLINICAL DATA:  70 year old female code stroke. Right facial droop and right side weakness beginning at 1415 hours today. Left cerebellar infarct in May. Initial encounter. EXAM: CT ANGIOGRAPHY HEAD AND NECK TECHNIQUE: Multidetector CT imaging of the head and neck was performed using the standard protocol during bolus administration of intravenous contrast. Multiplanar CT image reconstructions and MIPs were obtained to evaluate the vascular anatomy. Carotid stenosis measurements (when applicable) are obtained utilizing NASCET criteria, using the distal internal carotid diameter as the denominator. CONTRAST:  75 mL Isovue 370 COMPARISON:  Head CT without contrast 1512 hours today. CTA head and neck 05/15/2015 FINDINGS: CTA NECK Skeleton: Osteopenia. Absent dentition. Stable visualized spine. No acute osseous abnormality identified. Mild paranasal sinus mucosal thickening has increased since the May CTA. Mastoids remain clear. Upper chest: Stable and negative lung apices. No superior mediastinal lymphadenopathy. Other neck: Negative thyroid. Larynx and pharynx are partially effaced today but otherwise appear stable. Negative parapharyngeal and retropharyngeal spaces. Negative sublingual space, fatty infiltrated submandibular glands and parotid glands. Stable orbit and scalp soft tissues. No cervical lymphadenopathy. Aortic arch: 3 vessel  arch configuration. Stable moderate arch calcified atherosclerosis. Stable great vessel artery origins. Right carotid system: Stable right  CCA origin. Stable mild soft plaque at the right ICA origin without hemodynamically significant stenosis. Tortuosity of the right ICA distal to the bulb appears increased with a kinked appearance as seen on series 10, image 27. Otherwise no cervical right ICA stenosis. Left carotid system: Mild soft and calcified plaque in the left CCA is stable without stenosis. Soft and calcified plaque at the left carotid bifurcation is stable without stenosis. Calcified plaque and tortuosity at the distal bulb is stable. Kinked appearance of the vessel distal to the bulb is stable. Vertebral arteries:Stable proximal subclavian arteries. Calcified plaque at both vertebral artery origins resulting in stable mild vertebral artery origins stenosis bilaterally. The right vertebral artery is dominant as before. No other vertebral artery stenosis to the skullbase. CTA HEAD Posterior circulation: Stable distal vertebral arteries without stenosis. The left functionally terminates in PICA. Dominant appearing right AICA origin is patent. Diminutive basilar artery with fetal type bilateral PCA origins. No basilar stenosis. SCA origins are patent. Bilateral PCA branches are stable and within normal limits. Anterior circulation: Both ICA siphons are patent. Mild calcified plaque bilaterally without ICA siphon stenosis. Ophthalmic and posterior communicating artery origins are normal. Patent carotid termini. Normal MCA and ACA origins. Non dominant right A1 segment. Anterior communicating artery and bilateral ACA branches are normal. Left MCA M1 segment, bifurcation, and left MCA branches are stable and normal. Right MCA M1 segment, bifurcation, and right MCA branches are stable and normal. Venous sinuses: Patent. Anatomic variants: Dominant right vertebral artery. Fetal type bilateral PCA origins. Dominant  left ACA A1 segment. Delayed phase: Stable subtle left cavernous sinus enhancing lesion thought on prior studies to reflect a small meningioma (series 12, image 16). Small calcified right parasagittal meningioma also is stable (series 12, image 26). No other abnormal intracranial enhancement. Stable gray-white matter differentiation throughout the brain. IMPRESSION: 1. Negative for emergent large vessel occlusion. 2. Stable CTA head and neck since May. Calcified atherosclerosis at the aortic arch, in the neck, and at the skullbase, as well as tortuous bilateral ICAs, but no hemodynamically significant stenosis. 3. Stable small chronic left cavernous sinus and right vertex parasagittal meningiomas. 4. Continued stable CT appearance of the brain. Electronically Signed   By: Genevie Ann M.D.   On: 09/16/2015 17:00   Ct Angio Neck W And/or Wo Contrast  Result Date: 09/16/2015 CLINICAL DATA:  70 year old female code stroke. Right facial droop and right side weakness beginning at 1415 hours today. Left cerebellar infarct in May. Initial encounter. EXAM: CT ANGIOGRAPHY HEAD AND NECK TECHNIQUE: Multidetector CT imaging of the head and neck was performed using the standard protocol during bolus administration of intravenous contrast. Multiplanar CT image reconstructions and MIPs were obtained to evaluate the vascular anatomy. Carotid stenosis measurements (when applicable) are obtained utilizing NASCET criteria, using the distal internal carotid diameter as the denominator. CONTRAST:  75 mL Isovue 370 COMPARISON:  Head CT without contrast 1512 hours today. CTA head and neck 05/15/2015 FINDINGS: CTA NECK Skeleton: Osteopenia. Absent dentition. Stable visualized spine. No acute osseous abnormality identified. Mild paranasal sinus mucosal thickening has increased since the May CTA. Mastoids remain clear. Upper chest: Stable and negative lung apices. No superior mediastinal lymphadenopathy. Other neck: Negative thyroid. Larynx  and pharynx are partially effaced today but otherwise appear stable. Negative parapharyngeal and retropharyngeal spaces. Negative sublingual space, fatty infiltrated submandibular glands and parotid glands. Stable orbit and scalp soft tissues. No cervical lymphadenopathy. Aortic arch: 3 vessel arch configuration. Stable moderate arch calcified atherosclerosis. Stable great  vessel artery origins. Right carotid system: Stable right CCA origin. Stable mild soft plaque at the right ICA origin without hemodynamically significant stenosis. Tortuosity of the right ICA distal to the bulb appears increased with a kinked appearance as seen on series 10, image 27. Otherwise no cervical right ICA stenosis. Left carotid system: Mild soft and calcified plaque in the left CCA is stable without stenosis. Soft and calcified plaque at the left carotid bifurcation is stable without stenosis. Calcified plaque and tortuosity at the distal bulb is stable. Kinked appearance of the vessel distal to the bulb is stable. Vertebral arteries:Stable proximal subclavian arteries. Calcified plaque at both vertebral artery origins resulting in stable mild vertebral artery origins stenosis bilaterally. The right vertebral artery is dominant as before. No other vertebral artery stenosis to the skullbase. CTA HEAD Posterior circulation: Stable distal vertebral arteries without stenosis. The left functionally terminates in PICA. Dominant appearing right AICA origin is patent. Diminutive basilar artery with fetal type bilateral PCA origins. No basilar stenosis. SCA origins are patent. Bilateral PCA branches are stable and within normal limits. Anterior circulation: Both ICA siphons are patent. Mild calcified plaque bilaterally without ICA siphon stenosis. Ophthalmic and posterior communicating artery origins are normal. Patent carotid termini. Normal MCA and ACA origins. Non dominant right A1 segment. Anterior communicating artery and bilateral ACA  branches are normal. Left MCA M1 segment, bifurcation, and left MCA branches are stable and normal. Right MCA M1 segment, bifurcation, and right MCA branches are stable and normal. Venous sinuses: Patent. Anatomic variants: Dominant right vertebral artery. Fetal type bilateral PCA origins. Dominant left ACA A1 segment. Delayed phase: Stable subtle left cavernous sinus enhancing lesion thought on prior studies to reflect a small meningioma (series 12, image 16). Small calcified right parasagittal meningioma also is stable (series 12, image 26). No other abnormal intracranial enhancement. Stable gray-white matter differentiation throughout the brain. IMPRESSION: 1. Negative for emergent large vessel occlusion. 2. Stable CTA head and neck since May. Calcified atherosclerosis at the aortic arch, in the neck, and at the skullbase, as well as tortuous bilateral ICAs, but no hemodynamically significant stenosis. 3. Stable small chronic left cavernous sinus and right vertex parasagittal meningiomas. 4. Continued stable CT appearance of the brain. Electronically Signed   By: Genevie Ann M.D.   On: 09/16/2015 17:00   Ct Head Code Stroke W/o Cm  Addendum Date: 09/16/2015   ADDENDUM REPORT: 09/16/2015 15:38 ADDENDUM: Study discussed by telephone with Dr. Quentin Cornwall on 09/16/2015 At 1528 hours. Electronically Signed   By: Genevie Ann M.D.   On: 09/16/2015 15:38   Result Date: 09/16/2015 CLINICAL DATA:  Code stroke. 70 year old female with right facial droop and right side weakness onset at 1415 hours today. Initial encounter. Left cerebellar infarcts in May. EXAM: CT HEAD WITHOUT CONTRAST TECHNIQUE: Contiguous axial images were obtained from the base of the skull through the vertex without intravenous contrast. COMPARISON:  Head CT 08/16/2015. CTA head and neck 05/15/2015. Brain MRI 05/14/2015. FINDINGS: Stable visible paranasal sinuses and mastoids. Osteopenia. No acute osseous abnormality identified. No acute orbit or scalp soft  tissue findings. Calcified atherosclerosis at the skull base. Patchy bilateral cerebral white matter hypodensity appears stable. Chronic left greater than right cerebellar lacunar infarcts appear stable. Stable ventricle size and configuration. No midline shift, mass effect, or evidence of intracranial mass lesion. No acute intracranial hemorrhage identified. No cortically based acute infarct identified. ASPECTS [facility] Stroke Program Early CT Score) Total score (0-10 with 10 being normal): 10. No suspicious  intracranial vascular hyperdensity. I suspect there is a small right parasagittal 12 mm calcified meningioma which is chronic and clinically silent. It appears stable since a brain MRI on 01/28/2013. IMPRESSION: 1. No acute cortically based infarct or acute intracranial hemorrhage identified. 2. ASPECTS is 10. 3. Chronic small vessel disease. Suspected small 12 mm chronic right parasagittal calcified meningioma which is likely inconsequential. Electronically Signed: By: Genevie Ann M.D. On: 09/16/2015 15:25     CBC  Recent Labs Lab 09/16/15 1528  WBC 7.4  HGB 11.7*  HCT 35.0  PLT 249  MCV 79.9*  MCH 26.8  MCHC 33.5  RDW 22.2*  LYMPHSABS 2.9  MONOABS 0.5  EOSABS 0.1  BASOSABS 0.1    Chemistries   Recent Labs Lab 09/16/15 1528  NA 140  K 3.4*  CL 108  CO2 25  GLUCOSE 166*  BUN 10  CREATININE 0.77  CALCIUM 8.8*  AST 25  ALT 16  ALKPHOS 85  BILITOT 0.5   ------------------------------------------------------------------------------------------------------------------ estimated creatinine clearance is 65.3 mL/min (by C-G formula based on SCr of 0.8 mg/dL). ------------------------------------------------------------------------------------------------------------------ No results for input(s): HGBA1C in the last 72 hours. ------------------------------------------------------------------------------------------------------------------ No results for input(s): CHOL, HDL,  LDLCALC, TRIG, CHOLHDL, LDLDIRECT in the last 72 hours. ------------------------------------------------------------------------------------------------------------------ No results for input(s): TSH, T4TOTAL, T3FREE, THYROIDAB in the last 72 hours.  Invalid input(s): FREET3 ------------------------------------------------------------------------------------------------------------------ No results for input(s): VITAMINB12, FOLATE, FERRITIN, TIBC, IRON, RETICCTPCT in the last 72 hours.  Coagulation profile  Recent Labs Lab 09/16/15 1528  INR 1.00    No results for input(s): DDIMER in the last 72 hours.  Cardiac Enzymes  Recent Labs Lab 09/16/15 2129 09/17/15 0313 09/17/15 0928  TROPONINI <0.03 <0.03 <0.03   ------------------------------------------------------------------------------------------------------------------ Invalid input(s): POCBNP    Assessment & Plan  Patient is a 70 year old presenting with right-sided weakness   1. CVA: Recurrent right sided weakness. CT and CTA are negative.  Await MRI of the brain Was recently started on Aggrenox but states that she ran out of it and no refills were given I will continue this for now Obtain physical therapy evaluation  2.Sinus Bradycardia: Seems to be asymptomatic. Monitor for now. Asymptomatic  3. CAD: No chest pains Continue Crestor unable to use a beta blocker on Aggrenox which has aspirin  4. COPD: Currently asymptomatic continue for any signs of exasperation  5. History of seizure continue Keppra  6. DM2 and 2 new sliding scale insulin and oral regimen    Code Status Orders        Start     Ordered   09/16/15 1852  Full code  Continuous     09/16/15 1851    Code Status History    Date Active Date Inactive Code Status Order ID Comments User Context   08/16/2015  5:14 PM 08/19/2015  5:35 PM DNR [id number]  Willia Craze, NP Inpatient           Consults  pt   DVT Prophylaxis    lovenox  Lab Results  Component Value Date   PLT 249 09/16/2015     Time Spent in minutes35min  Greater than 50% of time spent in care coordination and counseling patient regarding the condition and plan of care.   Dustin Flock M.D on 09/17/2015 at 11:14 AM  Between 7am to 6pm - Pager - [phone]  After 6pm go to [email or web address] - password [email or web address] [facility]   Office  [phone]

## 2015-09-17 NOTE — Plan of Care (Signed)
Problem: Education: Goal: Knowledge of [facility] Education information/materials will improve Outcome: Progressing Pt likes to be called Samantha Aguirre  Past Medical History:  Diagnosis Date  . Arthritis   . Asthma   . Cancer ([place])   . CHF (congestive heart failure) ([place])   . COPD (chronic obstructive pulmonary disease) ([place])   . Coronary artery disease   . Diabetes mellitus without complication ([place])   . HLD (hyperlipidemia)   . Hypertension   . MI (myocardial infarction) ([place])   . Seizures ([place])   . Skin cancer   . Stroke ([place])   . Tremors of nervous system     Pt is well controlled with home medications

## 2015-09-17 NOTE — Care Management Important Message (Signed)
Important Message  Patient Details  Name: Samantha Aguirre MRN: [id number] Date of Birth: [birth year]   Medicare Important Message Given:  Yes    Shelbie Ammons, RN 09/17/2015, 9:02 AM

## 2015-09-18 ENCOUNTER — Inpatient Hospital Stay: Payer: Medicare Other

## 2015-09-18 LAB — GLUCOSE, CAPILLARY
GLUCOSE-CAPILLARY: 122 mg/dL — AB (ref 65–99)
Glucose-Capillary: 125 mg/dL — ABNORMAL HIGH (ref 65–99)
Glucose-Capillary: 84 mg/dL (ref 65–99)
Glucose-Capillary: 79 mg/dL (ref 65–99)

## 2015-09-18 MED ORDER — ALBUTEROL SULFATE (2.5 MG/3ML) 0.083% IN NEBU: 2.5000 mg | INHALATION_SOLUTION | RESPIRATORY_TRACT | Status: DC | PRN

## 2015-09-18 NOTE — Progress Notes (Signed)
[place] at [facility]                                                                                                                                                                                            Patient Demographics   Samantha Aguirre, is a 70 y.o. female, DOB - 1945/05/07, [id number]  Admit date - 09/16/2015   Admitting Physician Baxter Hire, MD  Outpatient Primary MD for the patient is No primary care provider on file.   LOS - 2  Subjective:patient presented with right sided weakness, was hospitalized at [facility] last month With the same complaints. At that time MRI of the brain was negative. Patient states that she still having right-sided weakness Patient says she ran out of medicine for almost 2 weeks. Today morning she is having cough, wheezing, headache.    Review of Systems:   CONSTITUTIONAL: No documented fever. No fatigue, weakness. No weight gain, no weight loss.  EYES: No blurry or double vision.  ENT: No tinnitus. No postnasal drip. No redness of the oropharynx.  RESPIRATORY: Cough, wheezing, headache today CARDIOVASCULAR: No chest pain. No orthopnea. No palpitations. No syncope.  GASTROINTESTINAL: No nausea, no vomiting or diarrhea. No abdominal pain. No melena or hematochezia.  GENITOURINARY: No dysuria or hematuria.  ENDOCRINE: No polyuria or nocturia. No heat or cold intolerance.  HEMATOLOGY: No anemia. No bruising. No bleeding.  INTEGUMENTARY: No rashes. No lesions.  MUSCULOSKELETAL: No arthritis. No swelling. No gout.  NEUROLOGIC: No numbness, tingling, or ataxia. No seizure-type activity. Right-sided weakness PSYCHIATRIC: No anxiety. No insomnia. No ADD.    Vitals:   Vitals:   09/17/15 1655 09/17/15 1955 09/18/15 0021 09/18/15 0404  BP: (!) 110/56 128/60 122/65 (!) 121/50  Pulse: (!) 55 (!) 55 (!) 52 (!) 58  Resp: 18 17 17 17   Temp: 97.7 F (36.5 C) 97.9 F (36.6 C) 97.6 F (36.4 C) 98 F (36.7  C)  TempSrc: Oral Oral Oral Oral  SpO2: 99% 97% 98% 97%  Weight:      Height:        Wt Readings from Last 3 Encounters:  09/17/15 88.5 kg (195 lb 3.2 oz)  08/18/15 85 kg (187 lb 4.8 oz)     Intake/Output Summary (Last 24 hours) at 09/18/15 0854 Last data filed at 09/18/15 0649  Gross per 24 hour  Intake              720 ml  Output              500 ml  Net  220 ml    Physical Exam:   GENERAL: Pleasant-appearing in no apparent distress.  HEAD, EYES, EARS, NOSE AND THROAT: Atraumatic, normocephalic. Extraocular muscles are intact. Pupils equal and reactive to light. Sclerae anicteric. No conjunctival injection. No oro-pharyngeal erythema.  NECK: Supple. There is no jugular venous distention. No bruits, no lymphadenopathy, no thyromegaly.  HEART: Regular rate and rhythm,. No murmurs, no rubs, no clicks.  LUNGS: Bilateral expiratory wheeze present. Not using accessory muscles of respiration. ABDOMEN: Soft, flat, nontender, nondistended. Has good bowel sounds. No hepatosplenomegaly appreciated.  EXTREMITIES: No evidence of any cyanosis, clubbing, or peripheral edema.  +2 pedal and radial pulses bilaterally.  NEUROLOGIC: The patient is alert, awake, and oriented x3 with no focal motor or sensory deficits appreciated bilaterally. Right upper extremity 4 out of 5 in right lower extremity 4 out of 5. left upper and lower extremity strength and sensations are intact. SKIN: Moist and warm with no rashes appreciated.  Psych: Not anxious, depressed LN: No inguinal LN enlargement    Antibiotics   Anti-infectives    None      Medications   Scheduled Meds: . acetaminophen  650 mg Oral QHS  . dipyridamole-aspirin  1 capsule Oral BID  . DULoxetine  60 mg Oral QHS  . enoxaparin (LOVENOX) injection  40 mg Subcutaneous Q24H  . fluticasone furoate-vilanterol  1 puff Inhalation Daily  . gabapentin  800 mg Oral BID  . glipiZIDE  5 mg Oral BID WC   And  . metFORMIN  500 mg  Oral BID WC  . insulin aspart  0-5 Units Subcutaneous QHS  . insulin aspart  0-9 Units Subcutaneous TID WC  . levETIRAcetam  500 mg Oral BID  . levothyroxine  125 mcg Oral QODAY  . potassium chloride  10 mEq Oral Daily  . rosuvastatin  40 mg Oral Daily  . sodium chloride flush  3 mL Intravenous Q12H  . sodium chloride flush  3 mL Intravenous Q12H  . umeclidinium bromide  1 puff Inhalation Daily   Continuous Infusions:  PRN Meds:.sodium chloride, acetaminophen, sodium chloride flush   Data Review:   Micro Results No results found for this or any previous visit (from the past 240 hour(s)).  Radiology Reports Ct Angio Head W Or Wo Contrast  Result Date: 09/16/2015 CLINICAL DATA:  70 year old female code stroke. Right facial droop and right side weakness beginning at 1415 hours today. Left cerebellar infarct in May. Initial encounter. EXAM: CT ANGIOGRAPHY HEAD AND NECK TECHNIQUE: Multidetector CT imaging of the head and neck was performed using the standard protocol during bolus administration of intravenous contrast. Multiplanar CT image reconstructions and MIPs were obtained to evaluate the vascular anatomy. Carotid stenosis measurements (when applicable) are obtained utilizing NASCET criteria, using the distal internal carotid diameter as the denominator. CONTRAST:  75 mL Isovue 370 COMPARISON:  Head CT without contrast 1512 hours today. CTA head and neck 05/15/2015 FINDINGS: CTA NECK Skeleton: Osteopenia. Absent dentition. Stable visualized spine. No acute osseous abnormality identified. Mild paranasal sinus mucosal thickening has increased since the May CTA. Mastoids remain clear. Upper chest: Stable and negative lung apices. No superior mediastinal lymphadenopathy. Other neck: Negative thyroid. Larynx and pharynx are partially effaced today but otherwise appear stable. Negative parapharyngeal and retropharyngeal spaces. Negative sublingual space, fatty infiltrated submandibular glands and  parotid glands. Stable orbit and scalp soft tissues. No cervical lymphadenopathy. Aortic arch: 3 vessel arch configuration. Stable moderate arch calcified atherosclerosis. Stable great vessel artery origins. Right carotid system: Stable  right CCA origin. Stable mild soft plaque at the right ICA origin without hemodynamically significant stenosis. Tortuosity of the right ICA distal to the bulb appears increased with a kinked appearance as seen on series 10, image 27. Otherwise no cervical right ICA stenosis. Left carotid system: Mild soft and calcified plaque in the left CCA is stable without stenosis. Soft and calcified plaque at the left carotid bifurcation is stable without stenosis. Calcified plaque and tortuosity at the distal bulb is stable. Kinked appearance of the vessel distal to the bulb is stable. Vertebral arteries:Stable proximal subclavian arteries. Calcified plaque at both vertebral artery origins resulting in stable mild vertebral artery origins stenosis bilaterally. The right vertebral artery is dominant as before. No other vertebral artery stenosis to the skullbase. CTA HEAD Posterior circulation: Stable distal vertebral arteries without stenosis. The left functionally terminates in PICA. Dominant appearing right AICA origin is patent. Diminutive basilar artery with fetal type bilateral PCA origins. No basilar stenosis. SCA origins are patent. Bilateral PCA branches are stable and within normal limits. Anterior circulation: Both ICA siphons are patent. Mild calcified plaque bilaterally without ICA siphon stenosis. Ophthalmic and posterior communicating artery origins are normal. Patent carotid termini. Normal MCA and ACA origins. Non dominant right A1 segment. Anterior communicating artery and bilateral ACA branches are normal. Left MCA M1 segment, bifurcation, and left MCA branches are stable and normal. Right MCA M1 segment, bifurcation, and right MCA branches are stable and normal. Venous  sinuses: Patent. Anatomic variants: Dominant right vertebral artery. Fetal type bilateral PCA origins. Dominant left ACA A1 segment. Delayed phase: Stable subtle left cavernous sinus enhancing lesion thought on prior studies to reflect a small meningioma (series 12, image 16). Small calcified right parasagittal meningioma also is stable (series 12, image 26). No other abnormal intracranial enhancement. Stable gray-white matter differentiation throughout the brain. IMPRESSION: 1. Negative for emergent large vessel occlusion. 2. Stable CTA head and neck since May. Calcified atherosclerosis at the aortic arch, in the neck, and at the skullbase, as well as tortuous bilateral ICAs, but no hemodynamically significant stenosis. 3. Stable small chronic left cavernous sinus and right vertex parasagittal meningiomas. 4. Continued stable CT appearance of the brain. Electronically Signed   By: Genevie Ann M.D.   On: 09/16/2015 17:00   Ct Angio Neck W And/or Wo Contrast  Result Date: 09/16/2015 CLINICAL DATA:  70 year old female code stroke. Right facial droop and right side weakness beginning at 1415 hours today. Left cerebellar infarct in May. Initial encounter. EXAM: CT ANGIOGRAPHY HEAD AND NECK TECHNIQUE: Multidetector CT imaging of the head and neck was performed using the standard protocol during bolus administration of intravenous contrast. Multiplanar CT image reconstructions and MIPs were obtained to evaluate the vascular anatomy. Carotid stenosis measurements (when applicable) are obtained utilizing NASCET criteria, using the distal internal carotid diameter as the denominator. CONTRAST:  75 mL Isovue 370 COMPARISON:  Head CT without contrast 1512 hours today. CTA head and neck 05/15/2015 FINDINGS: CTA NECK Skeleton: Osteopenia. Absent dentition. Stable visualized spine. No acute osseous abnormality identified. Mild paranasal sinus mucosal thickening has increased since the May CTA. Mastoids remain clear. Upper chest:  Stable and negative lung apices. No superior mediastinal lymphadenopathy. Other neck: Negative thyroid. Larynx and pharynx are partially effaced today but otherwise appear stable. Negative parapharyngeal and retropharyngeal spaces. Negative sublingual space, fatty infiltrated submandibular glands and parotid glands. Stable orbit and scalp soft tissues. No cervical lymphadenopathy. Aortic arch: 3 vessel arch configuration. Stable moderate arch calcified atherosclerosis. Stable  great vessel artery origins. Right carotid system: Stable right CCA origin. Stable mild soft plaque at the right ICA origin without hemodynamically significant stenosis. Tortuosity of the right ICA distal to the bulb appears increased with a kinked appearance as seen on series 10, image 27. Otherwise no cervical right ICA stenosis. Left carotid system: Mild soft and calcified plaque in the left CCA is stable without stenosis. Soft and calcified plaque at the left carotid bifurcation is stable without stenosis. Calcified plaque and tortuosity at the distal bulb is stable. Kinked appearance of the vessel distal to the bulb is stable. Vertebral arteries:Stable proximal subclavian arteries. Calcified plaque at both vertebral artery origins resulting in stable mild vertebral artery origins stenosis bilaterally. The right vertebral artery is dominant as before. No other vertebral artery stenosis to the skullbase. CTA HEAD Posterior circulation: Stable distal vertebral arteries without stenosis. The left functionally terminates in PICA. Dominant appearing right AICA origin is patent. Diminutive basilar artery with fetal type bilateral PCA origins. No basilar stenosis. SCA origins are patent. Bilateral PCA branches are stable and within normal limits. Anterior circulation: Both ICA siphons are patent. Mild calcified plaque bilaterally without ICA siphon stenosis. Ophthalmic and posterior communicating artery origins are normal. Patent carotid termini.  Normal MCA and ACA origins. Non dominant right A1 segment. Anterior communicating artery and bilateral ACA branches are normal. Left MCA M1 segment, bifurcation, and left MCA branches are stable and normal. Right MCA M1 segment, bifurcation, and right MCA branches are stable and normal. Venous sinuses: Patent. Anatomic variants: Dominant right vertebral artery. Fetal type bilateral PCA origins. Dominant left ACA A1 segment. Delayed phase: Stable subtle left cavernous sinus enhancing lesion thought on prior studies to reflect a small meningioma (series 12, image 16). Small calcified right parasagittal meningioma also is stable (series 12, image 26). No other abnormal intracranial enhancement. Stable gray-white matter differentiation throughout the brain. IMPRESSION: 1. Negative for emergent large vessel occlusion. 2. Stable CTA head and neck since May. Calcified atherosclerosis at the aortic arch, in the neck, and at the skullbase, as well as tortuous bilateral ICAs, but no hemodynamically significant stenosis. 3. Stable small chronic left cavernous sinus and right vertex parasagittal meningiomas. 4. Continued stable CT appearance of the brain. Electronically Signed   By: Genevie Ann M.D.   On: 09/16/2015 17:00   Ct Head Code Stroke W/o Cm  Addendum Date: 09/16/2015   ADDENDUM REPORT: 09/16/2015 15:38 ADDENDUM: Study discussed by telephone with Dr. Quentin Cornwall on 09/16/2015 At 1528 hours. Electronically Signed   By: Genevie Ann M.D.   On: 09/16/2015 15:38   Result Date: 09/16/2015 CLINICAL DATA:  Code stroke. 70 year old female with right facial droop and right side weakness onset at 1415 hours today. Initial encounter. Left cerebellar infarcts in May. EXAM: CT HEAD WITHOUT CONTRAST TECHNIQUE: Contiguous axial images were obtained from the base of the skull through the vertex without intravenous contrast. COMPARISON:  Head CT 08/16/2015. CTA head and neck 05/15/2015. Brain MRI 05/14/2015. FINDINGS: Stable visible paranasal  sinuses and mastoids. Osteopenia. No acute osseous abnormality identified. No acute orbit or scalp soft tissue findings. Calcified atherosclerosis at the skull base. Patchy bilateral cerebral white matter hypodensity appears stable. Chronic left greater than right cerebellar lacunar infarcts appear stable. Stable ventricle size and configuration. No midline shift, mass effect, or evidence of intracranial mass lesion. No acute intracranial hemorrhage identified. No cortically based acute infarct identified. ASPECTS [facility] Stroke Program Early CT Score) Total score (0-10 with 10 being normal): 10. No  suspicious intracranial vascular hyperdensity. I suspect there is a small right parasagittal 12 mm calcified meningioma which is chronic and clinically silent. It appears stable since a brain MRI on 01/28/2013. IMPRESSION: 1. No acute cortically based infarct or acute intracranial hemorrhage identified. 2. ASPECTS is 10. 3. Chronic small vessel disease. Suspected small 12 mm chronic right parasagittal calcified meningioma which is likely inconsequential. Electronically Signed: By: Genevie Ann M.D. On: 09/16/2015 15:25     CBC  Recent Labs Lab 09/16/15 1528  WBC 7.4  HGB 11.7*  HCT 35.0  PLT 249  MCV 79.9*  MCH 26.8  MCHC 33.5  RDW 22.2*  LYMPHSABS 2.9  MONOABS 0.5  EOSABS 0.1  BASOSABS 0.1    Chemistries   Recent Labs Lab 09/16/15 1528  NA 140  K 3.4*  CL 108  CO2 25  GLUCOSE 166*  BUN 10  CREATININE 0.77  CALCIUM 8.8*  AST 25  ALT 16  ALKPHOS 85  BILITOT 0.5   ------------------------------------------------------------------------------------------------------------------ estimated creatinine clearance is 65.3 mL/min (by C-G formula based on SCr of 0.8 mg/dL). ------------------------------------------------------------------------------------------------------------------ No results for input(s): HGBA1C in the last 72  hours. ------------------------------------------------------------------------------------------------------------------ No results for input(s): CHOL, HDL, LDLCALC, TRIG, CHOLHDL, LDLDIRECT in the last 72 hours. ------------------------------------------------------------------------------------------------------------------ No results for input(s): TSH, T4TOTAL, T3FREE, THYROIDAB in the last 72 hours.  Invalid input(s): FREET3 ------------------------------------------------------------------------------------------------------------------ No results for input(s): VITAMINB12, FOLATE, FERRITIN, TIBC, IRON, RETICCTPCT in the last 72 hours.  Coagulation profile  Recent Labs Lab 09/16/15 1528  INR 1.00    No results for input(s): DDIMER in the last 72 hours.  Cardiac Enzymes  Recent Labs Lab 09/16/15 2129 09/17/15 0313 09/17/15 0928  TROPONINI <0.03 <0.03 <0.03   ------------------------------------------------------------------------------------------------------------------ Invalid input(s): POCBNP    Assessment & Plan  Patient is a 70 year old presenting with right-sided weakness   1. CVA: Recurrent right sided weakness. CT and CTA are negative. Secondary to noncompliance with medication. Patient says that she moved from [facility] and in the process of getting a primary doctor to prescribe her medications. Not taking any of her medications for the last 2 weeks. Discussed this with Education officer, museum. Patient already has appointment with [facility] in [place]. Will prescribe  30 day worth  Of  the prescriptions when she is discharged. Await MRI of the brain Physical therapy recommends home health physical therapy.  2.Sinus Bradycardia: Seems to be asymptomatic. Monitor for now. Asymptomatic  3. CAD: No chest pains  Continue Crestor ,aggrenox. no Beta blockers because of sinus bradycardia.  4. COPD: Patient has a wheezing at this time. Added albuterol  inhalers, continue in close to talk. 5. History of seizure continue Keppra  6. DM2 and 2 new sliding scale insulin and oral regimen  Plan is to start albuterol inhaler, follow-up on MRI of the brain, likely discharge tomorrow morning. Advised to quit smoking. RN present in Pts  room during rounds,    Code Status Orders        Start     Ordered   09/16/15 1852  Full code  Continuous     09/16/15 1851    Code Status History    Date Active Date Inactive Code Status Order ID Comments User Context   08/16/2015  5:14 PM 08/19/2015  5:35 PM DNR [id number]  Willia Craze, NP Inpatient           Consults  pt   DVT Prophylaxis   lovenox  Lab Results  Component Value Date  PLT 249 09/16/2015     Time Spent in minutes27min  Greater than 50% of time spent in care coordination and counseling patient regarding the condition and plan of care.   Epifanio Lesches M.D on 09/18/2015 at 8:54 AM  Between 7am to 6pm - Pager - [phone]  After 6pm go to [email or web address] - password [email or web address] [place] [facility]   Office  [phone]

## 2015-09-18 NOTE — Progress Notes (Signed)
OT Cancellation Note  Patient Details Name: Samantha Aguirre MRN: [id number] DOB: 07-14-1945   Cancelled Treatment:    Reason Eval/Treat Not Completed: Patient at procedure or test/ unavailable  Harrel Carina, MS, OTR/L 09/18/2015, 10:20 AM

## 2015-09-18 NOTE — Progress Notes (Signed)
PT Cancellation Note  Patient Details Name: Samantha Aguirre MRN: [id number] DOB: 07-04-1945   Cancelled Treatment:    Reason Eval/Treat Not Completed: Other (comment) (Treatment session attempted.  Patient notably lethargic, with difficulty maintianing eyes open (received ativan for MRI earlier this AM).  Will re-attempt at later time/date as medically appropriate.)   Pruitt Taboada H. Owens Shark, PT, DPT, NCS 09/18/15, 11:34 AM [phone]

## 2015-09-19 LAB — GLUCOSE, CAPILLARY
GLUCOSE-CAPILLARY: 96 mg/dL (ref 65–99)
Glucose-Capillary: 113 mg/dL — ABNORMAL HIGH (ref 65–99)

## 2015-09-19 MED ORDER — ROSUVASTATIN CALCIUM 40 MG PO TABS: 40.0000 mg | ORAL_TABLET | Freq: Every day | ORAL | 0 refills | Status: DC

## 2015-09-19 MED ORDER — LEVOTHYROXINE SODIUM 125 MCG PO TABS: 125.0000 ug | ORAL_TABLET | ORAL | 0 refills | Status: DC

## 2015-09-19 MED ORDER — ALBUTEROL SULFATE HFA 108 (90 BASE) MCG/ACT IN AERS: 2.0000 | INHALATION_SPRAY | Freq: Four times a day (QID) | RESPIRATORY_TRACT | 2 refills | Status: DC | PRN

## 2015-09-19 MED ORDER — ASPIRIN-DIPYRIDAMOLE ER 25-200 MG PO CP12: 1.0000 | ORAL_CAPSULE | Freq: Two times a day (BID) | ORAL | 0 refills | Status: DC

## 2015-09-19 MED ORDER — FLUTICASONE FUROATE-VILANTEROL 100-25 MCG/INH IN AEPB: 1.0000 | INHALATION_SPRAY | Freq: Every day | RESPIRATORY_TRACT | 0 refills | Status: DC

## 2015-09-19 MED ORDER — GLIPIZIDE-METFORMIN HCL 5-500 MG PO TABS: 1.0000 | ORAL_TABLET | ORAL | 0 refills | Status: DC

## 2015-09-19 MED ORDER — LEVETIRACETAM 500 MG PO TABS: 500.0000 mg | ORAL_TABLET | Freq: Two times a day (BID) | ORAL | 0 refills | Status: DC

## 2015-09-19 MED ORDER — DULOXETINE HCL 60 MG PO CPEP: 60.0000 mg | ORAL_CAPSULE | Freq: Every day | ORAL | 1 refills | Status: DC

## 2015-09-19 NOTE — Care Management (Signed)
Discharge to home today per Dr. Dian Queen. Discussed home health agencies. Chose [facility]. Home Health request, face-to-face, history & physical and face sheet faxed to [facility]. Daughter will transport. Shelbie Ammons RN MSN CCM care Management [phone]

## 2015-09-19 NOTE — Progress Notes (Signed)
Received Md order to discharge patient to home, reviewed home meds, discharge instructions, prescriptions and and follow up appointments with patient and daughter and both verbalized understanding discharge to home in wheelchair with home health

## 2015-09-19 NOTE — Discharge Summary (Signed)
Samantha Aguirre, is a 70 y.o. female  DOB [birth year]  MRN [id number].  Admission date:  09/16/2015  Admitting Physician  Samantha Hire, MD  Discharge Date:  09/19/2015   Primary MD  No primary care provider on file.  Recommendations for primary care physician for things to follow:   Follow-up with [facility] as new pt/   Admission Diagnosis  Cerebral infarction due to unspecified mechanism [I63.9]   Discharge Diagnosis  Cerebral infarction due to unspecified mechanism [I63.9]    Active Problems:   CVA (cerebral infarction)      Past [facility] History:  Diagnosis Date  . Arthritis   . Asthma   . Cancer ([place])   . CHF (congestive heart failure) ([place])   . COPD (chronic obstructive pulmonary disease) ([place])   . Coronary artery disease   . Diabetes mellitus without complication ([place])   . HLD (hyperlipidemia)   . Hypertension   . MI (myocardial infarction) ([place])   . Seizures ([place])   . Skin cancer   . Stroke ([place])   . Tremors of nervous system     Past Surgical History:  Procedure Laterality Date  . ABDOMINAL HYSTERECTOMY    . APPENDECTOMY    . CHOLECYSTECTOMY    . KNEE SURGERY     left  . ROTATOR CUFF REPAIR     right       History of present illness and  [facility] Course:     Kindly see H&P for history of present illness and admission details, please review complete Labs, Consult reports and Test reports for all details in brief  HPI  from the history and physical done on the day of admission  69 year old female patient comes in with right-sided weakness. Patient was at [facility] on 8/3/ for right side weakness/acute stroke  And that time  discharged her  with [facility]. Patient ran out of Sam[facility] for 2 weeks and comes back to the [facility] on September 3 with right-sided weakness.  Admitted for evaluation of stroke.  A[facility]re Course  #1 right-sided weakness likely secondary to CVA: Patient refuse to consider TPA on this admission. Started back [facility]. Head CAT scan, CT angiogram negative for new strokes. MRI of the brain also was done. MRI of the brain showed chronic lacunar infarcts in both cerebellar hemispheres are stable. [facility] by physical therapy, they recommended home health physical therapy. Did not recommended any nursing home placement or assisted living placement. Advised her to make appointment with  physician at [facility], I gave  prescription for one-month supply of [facility]. #2. Wheezing secondary to COPD: Patient advised to quit smoking. Patient received albuterol nebulizers while in the [facility]. Discharge home with albuterol inhaler in addition to her  incruse ellipta. 3 diabetes mellitus type 2: Continue her breast glipizide and metformin. #4 depression #5. Hyperlipidemia: Continue statins. #6 hypothyroidism ;continue Synthroid. I wrote prescriptions for 1 month.supply for all her meds.    Discharge Condition: stable   Follow UP  Follow-up Information    [facility] Follow up in 1 week(s).             Discharge Instructions  and  Discharge Medications     Discharge Instructions    Ambulatory referral to Home Health    Complete by:  As directed   Please evaluate [facility] for admission to [facility].  Disciplines requested: Physical Therapy  Services to provide: Strengthening Exercises  Physician to follow patient's care (the  person listed here will be responsible for signing ongoing orders): Samantha Aguirre  Requested Start of Care Date: Tomorrow  I certify that this patient is under my care and that I, or a Samantha [facility]re or Samantha [facility]re working with me, had a face-to-face encounter that meets the physician face-to-face requirements with patient on 09/19/2015. The encounter with the patient  was in whole, or in part for the following [facility]re condition(s) which is the primary reason for home health care   Does the patient have Medicare or Medicaid?:  Yes   The encounter with the patient was in whole, or in part, for the following Agu[facility]re condition, which is the primary reason for home health care:  whole   Reason for Medically Necessary Home Health Services:  Therapy- Personnel officer, Public librarian   My clinical findings support the need for the above services:  Unsafe ambulation due to balance issues   I certify that, based on my findings, the following services are medically necessary home health services:  Physical therapy   Further, I certify that my clinical findings support that this patient is homebound due to:  Unsafe ambulation due to balance issues       Medication List    TAKE these medications   albuterol 108 (90 Base) MCG/ACT inhaler Commonly known as:  PROVENTIL HFA;VENTOLIN HFA Inhale 2 puffs into the lungs every 6 (six) hours as needed for wheezing or shortness of breath.   dipyridamole-aspirin 200-25 MG 12hr capsule Commonly known as:  [facility] Take 1 capsule by mouth 2 (two) times daily.   DULoxetine 60 MG capsule Commonly known as:  CYMBALTA Take 1 capsule (60 mg total) by mouth at bedtime.   fluticasone furoate-vilanterol 100-25 MCG/INH Aepb Commonly known as:  BREO ELLIPTA Inhale 1 puff into the lungs daily.   gabapentin 800 MG tablet Commonly known as:  NEURONTIN Take 800 mg by mouth 2 (two) times daily.   glipiZIDE-metformin 5-500 MG tablet Commonly known as:  METAGLIP Take 1-2 tablets by mouth See admin instructions. 2 tablets in the morning and 1 tablet at night   levETIRAcetam 500 MG tablet Commonly known as:  KEPPRA Take 1 tablet (500 mg total) by mouth 2 (two) times daily.   levothyroxine 125 MCG tablet Commonly known as:  SYNTHROID, LEVOTHROID Take 1 tablet (125 mcg total) by mouth every other day.    nitroGLYCERIN 0.4 MG SL tablet Commonly known as:  NITROSTAT Place 0.4 mg under the tongue every 5 (five) minutes as needed for chest pain.   potassium chloride 10 MEQ tablet Commonly known as:  K-DUR,KLOR-CON Take 10 mEq by mouth daily.   rosuvastatin 40 MG tablet Commonly known as:  CRESTOR Take 1 tablet (40 mg total) by mouth daily.   TYLENOL 325 MG tablet Generic drug:  acetaminophen Take 650 mg by mouth at bedtime.   umeclidinium bromide 62.5 MCG/INH Aepb Commonly known as:  INCRUSE ELLIPTA Inhale 1 puff into the lungs daily.         Diet and Activity recommendation: See Discharge Instructions above   Consults obtained -  PT   Major procedures and Radiology Reports - PLEASE review detailed and final reports for all details, in brief -     Ct Angio Head W Or Wo Contrast  Result Date: 09/16/2015 CLINICAL DATA:  70 year old female code stroke. Right facial droop and right side weakness beginning at 1415 hours today. Left cerebellar infarct in May. Initial encounter. EXAM: CT ANGIOGRAPHY HEAD AND NECK  TECHNIQUE: Multidetector CT imaging of the head and neck was performed using the standard protocol during bolus administration of intravenous contrast. Multiplanar CT image reconstructions and MIPs were obtained to evaluate the vascular anatomy. Carotid stenosis measurements (when applicable) are obtained utilizing NASCET criteria, using the distal internal carotid diameter as the denominator. CONTRAST:  75 mL Isovue 370 COMPARISON:  Head CT without contrast 1512 hours today. CTA head and neck 05/15/2015 FINDINGS: CTA NECK Skeleton: Osteopenia. Absent dentition. Stable visualized spine. No acute osseous abnormality identified. Mild paranasal sinus mucosal thickening has increased since the May CTA. Mastoids remain clear. Upper chest: Stable and negative lung apices. No superior mediastinal lymphadenopathy. Other neck: Negative thyroid. Larynx and pharynx are partially effaced  today but otherwise appear stable. Negative parapharyngeal and retropharyngeal spaces. Negative sublingual space, fatty infiltrated submandibular glands and parotid glands. Stable orbit and scalp soft tissues. No cervical lymphadenopathy. Aortic arch: 3 vessel arch configuration. Stable moderate arch calcified atherosclerosis. Stable great vessel artery origins. Right carotid system: Stable right CCA origin. Stable mild soft plaque at the right ICA origin without hemodynamically significant stenosis. Tortuosity of the right ICA distal to the bulb appears increased with a kinked appearance as seen on series 10, image 27. Otherwise no cervical right ICA stenosis. Left carotid system: Mild soft and calcified plaque in the left CCA is stable without stenosis. Soft and calcified plaque at the left carotid bifurcation is stable without stenosis. Calcified plaque and tortuosity at the distal bulb is stable. Kinked appearance of the vessel distal to the bulb is stable. Vertebral arteries:Stable proximal subclavian arteries. Calcified plaque at both vertebral artery origins resulting in stable mild vertebral artery origins stenosis bilaterally. The right vertebral artery is dominant as before. No other vertebral artery stenosis to the skullbase. CTA HEAD Posterior circulation: Stable distal vertebral arteries without stenosis. The left functionally terminates in PICA. Dominant appearing right AICA origin is patent. Diminutive basilar artery with fetal type bilateral PCA origins. No basilar stenosis. SCA origins are patent. Bilateral PCA branches are stable and within normal limits. Anterior circulation: Both ICA siphons are patent. Mild calcified plaque bilaterally without ICA siphon stenosis. Ophthalmic and posterior communicating artery origins are normal. Patent carotid termini. Normal MCA and ACA origins. Non dominant right A1 segment. Anterior communicating artery and bilateral ACA branches are normal. Left MCA M1  segment, bifurcation, and left MCA branches are stable and normal. Right MCA M1 segment, bifurcation, and right MCA branches are stable and normal. Venous sinuses: Patent. Anatomic variants: Dominant right vertebral artery. Fetal type bilateral PCA origins. Dominant left ACA A1 segment. Delayed phase: Stable subtle left cavernous sinus enhancing lesion thought on prior studies to reflect a small meningioma (series 12, image 16). Small calcified right parasagittal meningioma also is stable (series 12, image 26). No other abnormal intracranial enhancement. Stable gray-white matter differentiation throughout the brain. IMPRESSION: 1. Negative for emergent large vessel occlusion. 2. Stable CTA head and neck since May. Calcified atherosclerosis at the aortic arch, in the neck, and at the skullbase, as well as tortuous bilateral ICAs, but no hemodynamically significant stenosis. 3. Stable small chronic left cavernous sinus and right vertex parasagittal meningiomas. 4. Continued stable CT appearance of the brain. Electronically Signed   By: Genevie Ann M.D.   On: 09/16/2015 17:00   Ct Angio Neck W And/or Wo Contrast  Result Date: 09/16/2015 CLINICAL DATA:  70 year old female code stroke. Right facial droop and right side weakness beginning at 1415 hours today. Left cerebellar infarct in May.  Initial encounter. EXAM: CT ANGIOGRAPHY HEAD AND NECK TECHNIQUE: Multidetector CT imaging of the head and neck was performed using the standard protocol during bolus administration of intravenous contrast. Multiplanar CT image reconstructions and MIPs were obtained to evaluate the vascular anatomy. Carotid stenosis measurements (when applicable) are obtained utilizing NASCET criteria, using the distal internal carotid diameter as the denominator. CONTRAST:  75 mL Isovue 370 COMPARISON:  Head CT without contrast 1512 hours today. CTA head and neck 05/15/2015 FINDINGS: CTA NECK Skeleton: Osteopenia. Absent dentition. Stable visualized  spine. No acute osseous abnormality identified. Mild paranasal sinus mucosal thickening has increased since the May CTA. Mastoids remain clear. Upper chest: Stable and negative lung apices. No superior mediastinal lymphadenopathy. Other neck: Negative thyroid. Larynx and pharynx are partially effaced today but otherwise appear stable. Negative parapharyngeal and retropharyngeal spaces. Negative sublingual space, fatty infiltrated submandibular glands and parotid glands. Stable orbit and scalp soft tissues. No cervical lymphadenopathy. Aortic arch: 3 vessel arch configuration. Stable moderate arch calcified atherosclerosis. Stable great vessel artery origins. Right carotid system: Stable right CCA origin. Stable mild soft plaque at the right ICA origin without hemodynamically significant stenosis. Tortuosity of the right ICA distal to the bulb appears increased with a kinked appearance as seen on series 10, image 27. Otherwise no cervical right ICA stenosis. Left carotid system: Mild soft and calcified plaque in the left CCA is stable without stenosis. Soft and calcified plaque at the left carotid bifurcation is stable without stenosis. Calcified plaque and tortuosity at the distal bulb is stable. Kinked appearance of the vessel distal to the bulb is stable. Vertebral arteries:Stable proximal subclavian arteries. Calcified plaque at both vertebral artery origins resulting in stable mild vertebral artery origins stenosis bilaterally. The right vertebral artery is dominant as before. No other vertebral artery stenosis to the skullbase. CTA HEAD Posterior circulation: Stable distal vertebral arteries without stenosis. The left functionally terminates in PICA. Dominant appearing right AICA origin is patent. Diminutive basilar artery with fetal type bilateral PCA origins. No basilar stenosis. SCA origins are patent. Bilateral PCA branches are stable and within normal limits. Anterior circulation: Both ICA siphons are  patent. Mild calcified plaque bilaterally without ICA siphon stenosis. Ophthalmic and posterior communicating artery origins are normal. Patent carotid termini. Normal MCA and ACA origins. Non dominant right A1 segment. Anterior communicating artery and bilateral ACA branches are normal. Left MCA M1 segment, bifurcation, and left MCA branches are stable and normal. Right MCA M1 segment, bifurcation, and right MCA branches are stable and normal. Venous sinuses: Patent. Anatomic variants: Dominant right vertebral artery. Fetal type bilateral PCA origins. Dominant left ACA A1 segment. Delayed phase: Stable subtle left cavernous sinus enhancing lesion thought on prior studies to reflect a small meningioma (series 12, image 16). Small calcified right parasagittal meningioma also is stable (series 12, image 26). No other abnormal intracranial enhancement. Stable gray-white matter differentiation throughout the brain. IMPRESSION: 1. Negative for emergent large vessel occlusion. 2. Stable CTA head and neck since May. Calcified atherosclerosis at the aortic arch, in the neck, and at the skullbase, as well as tortuous bilateral ICAs, but no hemodynamically significant stenosis. 3. Stable small chronic left cavernous sinus and right vertex parasagittal meningiomas. 4. Continued stable CT appearance of the brain. Electronically Signed   By: Genevie Ann M.D.   On: 09/16/2015 17:00   [name]  Result Date: 09/18/2015 CLINICAL DATA:  70 year old female presenting with right side weakness and numbness, right side facial droop. Initial encounter. EXAM:  MRI HEAD WITHOUT CONTRAST TECHNIQUE: Multiplanar, multiecho pulse sequences of the brain and surrounding structures were obtained without intravenous contrast. COMPARISON:  CTA head and neck 09/16/2015, brain MRI 05/14/2015 and earlier. FINDINGS: Small left cavernous sinus and right superior parasagittal meningiomas are difficult to delineate by MRI without IV contrast.  These were demonstrated to be stable on the recent 09/16/2015 CTA. Major intracranial vascular flow voids are stable. No restricted diffusion or evidence of acute infarction. Stable gray and white matter signal since May a with moderate for age scattered nonspecific white matter T2 and FLAIR hyperintensity. No definite cortical encephalomalacia. Small chronic micro hemorrhage in the left lateral occipital lobe is stable. Chronic lacunar infarcts in both cerebellar hemispheres are stable. Stable visualized internal auditory structures. Mastoids remain clear. Mild ethmoid and sphenoid sinus mucosal thickening today. Stable orbit and scalp soft tissues. Normal bone marrow signal. Negative visualized cervical spine. No midline shift, mass effect, evidence of mass lesion, ventriculomegaly, extra-axial collection or acute intracranial hemorrhage. Cervicomedullary junction and pituitary are within normal limits. IMPRESSION: 1. No acute intracranial abnormality. Stable noncontrast MRI appearance of the brain since May. Cerebral white matter and cerebellar signal changes most compatible with chronic small vessel disease. 2. Suspected small left cavernous sinus and superior right parasagittal meningiomas difficult to delineate without IV contrast but appeared stable on the recent 09/16/2015 CTA. Electronically Signed   By: Genevie Ann M.D.   On: 09/18/2015 09:40   Ct Head Code Stroke W/o Cm  Addendum Date: 09/16/2015   ADDENDUM REPORT: 09/16/2015 15:38 ADDENDUM: Study discussed by telephone with Dr. Quentin Cornwall on 09/16/2015 At 1528 hours. Electronically Signed   By: Genevie Ann M.D.   On: 09/16/2015 15:38   Result Date: 09/16/2015 CLINICAL DATA:  Code stroke. 70 year old female with right facial droop and right side weakness onset at 1415 hours today. Initial encounter. Left cerebellar infarcts in May. EXAM: CT HEAD WITHOUT CONTRAST TECHNIQUE: Contiguous axial images were obtained from the base of the skull through the vertex without  intravenous contrast. COMPARISON:  Head CT 08/16/2015. CTA head and neck 05/15/2015. Brain MRI 05/14/2015. FINDINGS: Stable visible paranasal sinuses and mastoids. Osteopenia. No acute osseous abnormality identified. No acute orbit or scalp soft tissue findings. Calcified atherosclerosis at the skull base. Patchy bilateral cerebral white matter hypodensity appears stable. Chronic left greater than right cerebellar lacunar infarcts appear stable. Stable ventricle size and configuration. No midline shift, mass effect, or evidence of intracranial mass lesion. No acute intracranial hemorrhage identified. No cortically based acute infarct identified. ASPECTS [facility] Stroke Program Early CT Score) Total score (0-10 with 10 being normal): 10. No suspicious intracranial vascular hyperdensity. I suspect there is a small right parasagittal 12 mm calcified meningioma which is chronic and clinically silent. It appears stable since a brain MRI on 01/28/2013. IMPRESSION: 1. No acute cortically based infarct or acute intracranial hemorrhage identified. 2. ASPECTS is 10. 3. Chronic small vessel disease. Suspected small 12 mm chronic right parasagittal calcified meningioma which is likely inconsequential. Electronically Signed: By: Genevie Ann M.D. On: 09/16/2015 15:25    Micro Results    No results found for this or any previous visit (from the past 240 hour(s)).     Today   Subjective:   [facility] today has no headache,no chest abdominal pain,no new weakness tingling or numbness, feels much better wants to go home today.   Objective:   Blood pressure 126/71, pulse 61, temperature 97.7 F (36.5 C), temperature source Oral, resp. rate 17, height 5'  1" (1.549 m), weight 88.5 kg (195 lb 3.2 oz), SpO2 96 %.   Intake/Output Summary (Last 24 hours) at 09/19/15 1235 Last data filed at 09/19/15 0953  Gross per 24 hour  Intake              360 ml  Output             1700 ml  Net            -1340 ml     Exam Awake Alert, Oriented x 3, No new F.N deficits, Normal affect [place].AT,PERRAL Supple Neck,No JVD, No cervical lymphadenopathy appriciated.  Symmetrical Chest wall movement, Good air movement bilaterally, CTAB RRR,No Gallops,Rubs or new Murmurs, No Parasternal Heave +ve B.Sounds, Abd Soft, Non tender, No organomegaly appriciated, No rebound -guarding or rigidity. No Cyanosis, Clubbing or edema, No new Rash or bruise  Data Review   CBC w Diff: Lab Results  Component Value Date   WBC 7.4 09/16/2015   HGB 11.7 (L) 09/16/2015   HCT 35.0 09/16/2015   PLT 249 09/16/2015   LYMPHOPCT 39 09/16/2015   MONOPCT 7 09/16/2015   EOSPCT 1 09/16/2015   BASOPCT 1 09/16/2015    CMP: Lab Results  Component Value Date   NA 140 09/16/2015   K 3.4 (L) 09/16/2015   CL 108 09/16/2015   CO2 25 09/16/2015   BUN 10 09/16/2015   CREATININE 0.77 09/16/2015   PROT 7.2 09/16/2015   ALBUMIN 4.0 09/16/2015   BILITOT 0.5 09/16/2015   ALKPHOS 85 09/16/2015   AST 25 09/16/2015   ALT 16 09/16/2015  .   Total Time in preparing paper work, data evaluation and todays exam - 78 minutes  Lian Pounds M.D on 09/19/2015 at 12:35 PM    Note: This dictation was prepared with Dragon dictation along with smaller phrase technology. Any transcriptional errors that result from this process are unintentional.

## 2015-09-19 NOTE — Progress Notes (Signed)
Physical Therapy Treatment Patient Details Name: Samantha Aguirre MRN: [id number] DOB: 1945-01-28 Today's Date: 09/19/2015    History of Present Illness presented to ER secondary to recurrent R-sided weakness; admitted with acute CVA.  CT head negative for acute change; CT angiography head/neck negative for large vessel occlusion.  MRI pending.  Of note, patient recently hospitalized at [facility] for CVA with R-sided weakness; discharged home on aggrenox, but has now run out and has not been taking.    PT Comments    Pt in bed, agrees to session.  In and out of bed with no assistance but increased time.  She was able to stand and ambulate around unit with walker and min guard.  No loss of balance noted but does need some verbal cues for safety.   Follow Up Recommendations  Home health PT     Equipment Recommendations       Recommendations for Other Services       Precautions / Restrictions Precautions Precautions: Fall Restrictions Weight Bearing Restrictions: No    Mobility  Bed Mobility Overal bed mobility: Modified Independent                Transfers Overall transfer level: Needs assistance Equipment used: Rolling walker (2 wheeled) Transfers: Sit to/from Stand Sit to Stand: Min guard (verbal cues for hand placements)            Ambulation/Gait Ambulation/Gait assistance: Min assist Ambulation Distance (Feet): 220 Feet Assistive device: Rolling walker (2 wheeled) Gait Pattern/deviations: Step-through pattern         Stairs            Wheelchair Mobility    Modified Rankin (Stroke Patients Only)       Balance Overall balance assessment: Needs assistance Sitting-balance support: Feet supported Sitting balance-Leahy Scale: Good     Standing balance support: Bilateral upper extremity supported Standing balance-Leahy Scale: Fair                      Cognition Arousal/Alertness: Awake/alert Behavior During Therapy: WFL for tasks  assessed/performed Overall Cognitive Status: Within Functional Limits for tasks assessed                      Exercises      General Comments        Pertinent Vitals/Pain Pain Assessment: No/denies pain    Home Living                      Prior Function            PT Goals (current goals can now be found in the care plan section)      Frequency  7X/week    PT Plan Current plan remains appropriate    Co-evaluation             End of Session Equipment Utilized During Treatment: Gait belt Activity Tolerance: Patient tolerated treatment well Patient left: with call bell/phone within reach;in chair;with chair alarm set     Time: [phone] PT Time Calculation (min) (ACUTE ONLY): 9 min  Charges:  $Gait Training: 8-22 mins                    G Codes:      Chesley Noon, PTA 09/19/15, 9:37 AM

## 2015-10-03 ENCOUNTER — Encounter: Payer: Self-pay | Admitting: Internal Medicine

## 2015-10-09 ENCOUNTER — Ambulatory Visit: Payer: Self-pay | Admitting: Internal Medicine

## 2015-10-30 ENCOUNTER — Ambulatory Visit: Payer: Medicare Other | Admitting: Family Medicine

## 2015-11-13 ENCOUNTER — Ambulatory Visit (INDEPENDENT_AMBULATORY_CARE_PROVIDER_SITE_OTHER): Payer: Medicare Other | Admitting: Family Medicine

## 2015-11-13 ENCOUNTER — Ambulatory Visit
Admission: RE | Admit: 2015-11-13 | Discharge: 2015-11-13 | Disposition: A | Discharge status: Home / Self Care | Payer: Medicare Other | Source: Ambulatory Visit | Attending: Family Medicine | Admitting: Family Medicine

## 2015-11-13 ENCOUNTER — Encounter: Payer: Self-pay | Admitting: Family Medicine

## 2015-11-13 VITALS — BP 140/72 | HR 63 | Temp 99.0°F | Resp 16 | Ht 61.0 in | Wt 180.0 lb

## 2015-11-13 DIAGNOSIS — I10 Essential (primary) hypertension: Secondary | ICD-10-CM | POA: Diagnosis not present

## 2015-11-13 DIAGNOSIS — Z7689 Persons encountering health services in other specified circumstances: Secondary | ICD-10-CM | POA: Diagnosis not present

## 2015-11-13 DIAGNOSIS — R937 Abnormal findings on diagnostic imaging of other parts of musculoskeletal system: Secondary | ICD-10-CM | POA: Diagnosis not present

## 2015-11-13 DIAGNOSIS — M25562 Pain in left knee: Secondary | ICD-10-CM

## 2015-11-13 DIAGNOSIS — E038 Other specified hypothyroidism: Secondary | ICD-10-CM

## 2015-11-13 DIAGNOSIS — E782 Mixed hyperlipidemia: Secondary | ICD-10-CM

## 2015-11-13 DIAGNOSIS — M170 Bilateral primary osteoarthritis of knee: Secondary | ICD-10-CM | POA: Insufficient documentation

## 2015-11-13 DIAGNOSIS — M159 Polyosteoarthritis, unspecified: Secondary | ICD-10-CM | POA: Insufficient documentation

## 2015-11-13 DIAGNOSIS — Z85828 Personal history of other malignant neoplasm of skin: Secondary | ICD-10-CM | POA: Diagnosis not present

## 2015-11-13 DIAGNOSIS — M150 Primary generalized (osteo)arthritis: Secondary | ICD-10-CM

## 2015-11-13 DIAGNOSIS — Z720 Tobacco use: Secondary | ICD-10-CM

## 2015-11-13 DIAGNOSIS — G8929 Other chronic pain: Secondary | ICD-10-CM

## 2015-11-13 DIAGNOSIS — F3341 Major depressive disorder, recurrent, in partial remission: Secondary | ICD-10-CM

## 2015-11-13 DIAGNOSIS — S8991XA Unspecified injury of right lower leg, initial encounter: Secondary | ICD-10-CM | POA: Diagnosis not present

## 2015-11-13 DIAGNOSIS — E876 Hypokalemia: Secondary | ICD-10-CM

## 2015-11-13 DIAGNOSIS — R569 Unspecified convulsions: Secondary | ICD-10-CM

## 2015-11-13 DIAGNOSIS — W19XXXA Unspecified fall, initial encounter: Secondary | ICD-10-CM

## 2015-11-13 DIAGNOSIS — Z23 Encounter for immunization: Secondary | ICD-10-CM

## 2015-11-13 DIAGNOSIS — E1140 Type 2 diabetes mellitus with diabetic neuropathy, unspecified: Secondary | ICD-10-CM

## 2015-11-13 DIAGNOSIS — J410 Simple chronic bronchitis: Secondary | ICD-10-CM

## 2015-11-13 DIAGNOSIS — Z8781 Personal history of (healed) traumatic fracture: Secondary | ICD-10-CM | POA: Insufficient documentation

## 2015-11-13 DIAGNOSIS — I2510 Atherosclerotic heart disease of native coronary artery without angina pectoris: Secondary | ICD-10-CM | POA: Diagnosis not present

## 2015-11-13 DIAGNOSIS — I639 Cerebral infarction, unspecified: Secondary | ICD-10-CM | POA: Diagnosis not present

## 2015-11-13 DIAGNOSIS — M25561 Pain in right knee: Secondary | ICD-10-CM | POA: Diagnosis not present

## 2015-11-13 DIAGNOSIS — M810 Age-related osteoporosis without current pathological fracture: Secondary | ICD-10-CM | POA: Insufficient documentation

## 2015-11-13 DIAGNOSIS — M545 Low back pain: Secondary | ICD-10-CM

## 2015-11-13 LAB — POCT UA - MICROALBUMIN: Microalbumin Ur, POC: 20 mg/L

## 2015-11-13 MED ORDER — DULOXETINE HCL 60 MG PO CPEP: 60.0000 mg | ORAL_CAPSULE | Freq: Every day | ORAL | 11 refills | Status: DC

## 2015-11-13 MED ORDER — GABAPENTIN 800 MG PO TABS: 800.0000 mg | ORAL_TABLET | Freq: Two times a day (BID) | ORAL | 11 refills | Status: DC

## 2015-11-13 MED ORDER — FLUTICASONE FUROATE-VILANTEROL 100-25 MCG/INH IN AEPB: 1.0000 | INHALATION_SPRAY | Freq: Every day | RESPIRATORY_TRACT | 11 refills | Status: DC

## 2015-11-13 MED ORDER — BACLOFEN 10 MG PO TABS: 5.0000 mg | ORAL_TABLET | Freq: Three times a day (TID) | ORAL | 1 refills | Status: DC | PRN

## 2015-11-13 MED ORDER — UMECLIDINIUM BROMIDE 62.5 MCG/INH IN AEPB: 1.0000 | INHALATION_SPRAY | Freq: Every day | RESPIRATORY_TRACT | 11 refills | Status: DC

## 2015-11-13 MED ORDER — LEVOTHYROXINE SODIUM 125 MCG PO TABS: 125.0000 ug | ORAL_TABLET | ORAL | 5 refills | Status: DC

## 2015-11-13 MED ORDER — ROSUVASTATIN CALCIUM 40 MG PO TABS: 40.0000 mg | ORAL_TABLET | Freq: Every day | ORAL | 11 refills | Status: DC

## 2015-11-13 MED ORDER — GLIPIZIDE-METFORMIN HCL 5-500 MG PO TABS: 1.0000 | ORAL_TABLET | ORAL | 5 refills | Status: DC

## 2015-11-13 MED ORDER — LISINOPRIL 10 MG PO TABS: 10.0000 mg | ORAL_TABLET | Freq: Every day | ORAL | 5 refills | Status: DC
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
## 2015-11-13 NOTE — Patient Instructions (Signed)
Thank you for coming in to clinic today.  1. Refilled all meds except - Aggrenox and Keppra - please contact your Neurologist Dr Sabra Heck for these refills for now - Start new med Lisinopril 10mg  daily, take once a day for BP, we will re-check blood work 1 week after starting this medication - Stop taking Potassium supplement - let us know if you find name of your other BP pill  2. For knee pain and fall - Will check Knee x-rays today and notify you of results, usually will release results online to MyChart or we can review at next appointment, will call if there is a fracture or significant problem - Rest, elevate, use compression, ice or heat as needed - Recommend to start taking Tylenol Extra Strength 500mg  tabs - take 1 to 2 tabs (max 1000mg  per dose) every 6 hours for pain (take regularly, don't skip a dose for next 3-7 days), max 24 hour daily dose is 6 to 8 tablets or 3000 to 4000mg   Start taking Baclofen (Lioresal) 10mg  (muscle relaxant) - start with half (cut) to one whole pill at night as needed for next 1-3 nights (may make you drowsy, caution with driving) see how it affects you, then if tolerated increase to one pill 2 to 3 times a day or (every 8 hours as needed)  We will work on all of your referrals between now and next appointment  Please schedule a follow-up appointment with Dr. Parks Ranger in 2 weeks to follow-up BP, follow-up lab results, Left Knee Pain / Fall  If you have any other questions or concerns, please feel free to call the clinic or send a message through [place]. You may also schedule an earlier appointment if necessary.  Nobie Putnam, DO [place]

## 2015-11-14 ENCOUNTER — Encounter: Payer: Self-pay | Admitting: Family Medicine

## 2015-11-14 DIAGNOSIS — F32A Depression, unspecified: Secondary | ICD-10-CM | POA: Insufficient documentation

## 2015-11-14 DIAGNOSIS — F329 Major depressive disorder, single episode, unspecified: Secondary | ICD-10-CM | POA: Insufficient documentation

## 2015-11-14 NOTE — Assessment & Plan Note (Signed)
Chronic problem, bilateral knees, hands, feet, and low back. Previously followed by Rheumatology, discuss more at next visit, unclear if history of rheumatoid arthritis See Left knee pain today for details

## 2015-11-14 NOTE — Assessment & Plan Note (Signed)
Acute left knee medial pain following recent traumatic fall, given history of osteoporosis concern for possible fracture. Other history with known OA in knees bilaterally. Currently limiting some ambulation, has had prior left knee arthroscopic procedure >12 yrs ago  Plan: 1. Check X-rays Left knee complete, and bilateral standing for comparison, results reviewed showed Left knee without evidence of new acute fracture, found old latearl tib plateau fracture and mild depression, stable persistent avulsion of lateral tibial spine, mild DJD medial compartment with joint space loss. Right knee with mild DJD (less compared to Left). Will review results at next visit, unable to reach patient at listed phone number 2. RICE therapy, compression, elevation, ice packs PRN 3. Trial on Baclofen muscle relaxant given surrounding muscle spasm in leg muscles surrounding her knee, slow titration 5-10mg  PRN 4. Regular Tylenol dosing 5. Consider future steroid injection in L knee 6. Future may need orthopedic evaluation if not improving, given prior knee arthroscopy, possibly may have degenerative meniscus injury on Left especially if continues to fall with knee instability

## 2015-11-14 NOTE — Assessment & Plan Note (Addendum)
Chronic history of multiple recurrent CVA, most recently 08/2015, after being off anti-platelet therapy. Previously followed by Dr Sabra Heck [facility] Neurology - Referral to establish with [facility] Neurology, will need to renew Aggrenox rx - Start ACE today for BP control to avoid risk of CVA, continue statin - Follow-up as needed

## 2015-11-14 NOTE — Assessment & Plan Note (Signed)
Active chronic smoker Smoking cessation counseling given

## 2015-11-14 NOTE — Assessment & Plan Note (Signed)
Controlled previously on levothyroxine, recent elevated TSH 43 in hospital Refill levothyroxine today Re-check TSH around 4-8 weeks

## 2015-11-14 NOTE — Assessment & Plan Note (Addendum)
Stable previously well controlled, last A1c 6.6 in (08/2015) up from 6.1 in 09/2015 Controlled on Glipizide/Metformin without symptoms of hypoglycemia Never on ACE/ARB Complicated with DM Neuropathy  Plan: 1. Check urine microalbumin today - normal, no known DM nephropathy 2. Refilled Glizipide/Metformin continue as previously taking 2 AM and 1 PM, future consider switch to only Metformin and stop sulfonylurea given concern for hypoglycemia risk, consider SGLT2 vs GLP1 (strong consideration for Victoza with indication for reduced CV mortality and non fatal MI / stroke) 3. New start ACEi with Lisinopril 10mg  daily - check BMET 1 week 4. Continue anti-platelet, statin 5. Referral to Podiatry [facility]) previously followed by [facility] Neurology 6. Referral to Ophthalmology [facility]) for annual DM exam 7. Follow-up q 3 months DM checks A1c monitoring, also will be due for PCV13 (s/p PPSV23 in 2016)

## 2015-11-14 NOTE — Assessment & Plan Note (Signed)
Currently on rosuvastatin 40mg  daily for high intensity in setting s/p CVA, MI with CAD, DM No recent lipid panel, follow-up in future for yearly fasting lipids

## 2015-11-14 NOTE — Assessment & Plan Note (Signed)
Mild elevated BP, today borderline SBP with 140 on manual re-check Not currently on any anti-HTN agents (recent discontinued Atenolol and Verapamil due to hypotension in hospital) - Concern given recent CVA history  Plan: 1. Start low dose ACEi with Lisinopril 10mg  daily, check future BMET 1 week after start f/u Cr and K, follow-up in office BP re-check

## 2015-11-14 NOTE — Assessment & Plan Note (Signed)
Chronic problem, controlled on Keppra Followed by [facility] Neurology Dr Sabra Heck Referral placed to transfer care to [facility] Neurology (given patient has relocated to the area), will need renew Keppra rx

## 2015-11-14 NOTE — Assessment & Plan Note (Signed)
Stable, chronic problem with long history of tobacco abuse. Previously followed by [facility] Pulmonology  Plan: 1. Refilled Incruse (anticholinergic), Breo (inhaled steroid, LABA) inhalers today for maintenance use 2. Continue Albuterol PRN 3. Counseling on smoking cessation, continue to taper off 4. Follow-up as needed - future consider referral to Pulmonology if needed

## 2015-11-14 NOTE — Assessment & Plan Note (Addendum)
Recent history 1-2 yr ago with isolated lesion on nose, surgically removed. Patient does not recall, but suspect may be basal cell carcinoma based on description. No known history of melanoma or fam history. - Referral to [facility] (Dr Aubery Lapping) for annual routine total body surface skin checks with history of skin cancer

## 2015-11-14 NOTE — Assessment & Plan Note (Signed)
Chronic problem, without acute worsening Previously on Zoloft, now currently on Cymbalta (not significantly improving chronic pain) Follow-up at next visit for more details, refilled Cymbalta at this time

## 2015-11-14 NOTE — Assessment & Plan Note (Addendum)
Stable, chronic history of CAD with MI >20 yr ago s/p 3 stents, recent cardiac ablation 2 yr ago. On anti platelet therapy aggrenox, statin Previously followed by Cardiology [place]  Plan: 1. New referral to establish care with [facility] Cardiology 2. Continue anti-platelet, follow-up with Neuro for continued Aggrenox refills 3. Discontinued NTG has never used, can re-discuss with cardiology if indicated 4. New start ACE today for BP

## 2015-11-14 NOTE — Assessment & Plan Note (Addendum)
Chronic history of osteoporosis Last DEXA 2016 Recent x-rays consistent with ostepenia in knees Previously on injectable, thought to be Prolia (last inj >1 year ago), most likely would continue q 6 months for now Follow-up DEXA and likely will refer to Rheumatology, may continue Prolia as indicated through them as we do not routinely order and administer this here.

## 2015-11-20 ENCOUNTER — Encounter: Payer: Self-pay | Admitting: Family Medicine

## 2015-11-20 ENCOUNTER — Ambulatory Visit (INDEPENDENT_AMBULATORY_CARE_PROVIDER_SITE_OTHER): Payer: Medicare Other | Admitting: Family Medicine

## 2015-11-20 ENCOUNTER — Other Ambulatory Visit: Payer: Medicare Other

## 2015-11-20 VITALS — BP 162/86 | HR 57 | Temp 98.3°F | Resp 16 | Ht 61.0 in | Wt 183.0 lb

## 2015-11-20 DIAGNOSIS — M150 Primary generalized (osteo)arthritis: Secondary | ICD-10-CM | POA: Diagnosis not present

## 2015-11-20 DIAGNOSIS — I639 Cerebral infarction, unspecified: Secondary | ICD-10-CM

## 2015-11-20 DIAGNOSIS — I10 Essential (primary) hypertension: Secondary | ICD-10-CM | POA: Diagnosis not present

## 2015-11-20 DIAGNOSIS — M25562 Pain in left knee: Secondary | ICD-10-CM | POA: Diagnosis not present

## 2015-11-20 DIAGNOSIS — Z23 Encounter for immunization: Secondary | ICD-10-CM

## 2015-11-20 DIAGNOSIS — E876 Hypokalemia: Secondary | ICD-10-CM | POA: Diagnosis not present

## 2015-11-20 DIAGNOSIS — M159 Polyosteoarthritis, unspecified: Secondary | ICD-10-CM

## 2015-11-20 MED ORDER — METHYLPREDNISOLONE ACETATE 40 MG/ML IJ SUSP
40.0000 mg | Freq: Once | INTRAMUSCULAR | Status: AC
  Administered 2015-11-20: 40 mg via INTRA_ARTICULAR

## 2015-11-20 MED ORDER — LIDOCAINE HCL (PF) 1 % IJ SOLN
4.0000 mL | Freq: Once | INTRAMUSCULAR | Status: AC
  Administered 2015-11-20: 4 mL

## 2015-11-20 NOTE — Assessment & Plan Note (Signed)
Underlying etiology for Left knee acute on chronic pain, also concern for underlying L knee injury from recent fall given no improvement - S/p steroid injection today

## 2015-11-20 NOTE — Assessment & Plan Note (Signed)
Concern for recent possible TIA episode 1 week ago, with chronic history of prior CVAs with residual deficits - Continue Aggrenox - Follow-up with [facility] Neurology as scheduled - Advised to seek immediate medical attention if recurrent episode

## 2015-11-20 NOTE — Progress Notes (Signed)
Subjective:    Patient ID: Samantha Aguirre, female    DOB: 12-27-1945, 70 y.o.   MRN: [id number]  Samantha Aguirre is a 70 y.o. female presenting on 11/20/2015 for knee injection   HPI   FOLLOW-UP LEFT Knee Pain with chronic DJD / h/o tibial fracture: - Last seen by me on 10/31 to establish care at [facility], history of chronic known OA/DJD in bilateral knees, L>R, and prior L knee arthroscopic surgery 2005. Recently had traumatic fall 2 weeks ago with L knee injury, X-rays were obtained with significant mild-moderate DJD bilaterally (L>R) and old tibial plateau fracture and avulsion. She was treated with conservative therapy initially with NSAID, Tylenol, RICE therapy - Today follows up given persistent Left knee pain without improvement on treatment. Interested in Left knee corticosteroid injection. She has had multiple prior steroid injections before. - Denies any new injury / fall / trauma, joint redness or swelling, other joint pain, fever/chills  H/o CVA, recent concern for possible TIA: - Known chronic history of vascular disease with CAD s/p MI with stents, also prior CVA in 08/2015, with chronic residual left sided weakness and chronic residual facial weakness. She is treated with Aggrenox from neurology (previously in [facility]), now she is to be established with [facility] Neurology. - Reports additional concern 1 week ago with acute onset symptoms of headache and dizziness, associated with nausea and "felt sick", she did not seek medical treatment but stated that these symptoms resolved within 24 hours. She does admit to residual Right arm intermittent numbness, without any currently. - Denies any new extremity weakness, vertigo, dizziness, lightheadedness, nausea, vomiting, chest pain, syncope, dyspnea   Social History  Substance Use Topics  . Smoking status: Current Every Day Smoker    Packs/day: 0.50    Years: 50.00    Types: Cigarettes  . Smokeless tobacco: Current User    . Alcohol use No    Review of Systems Per HPI unless specifically indicated above     Objective:    BP (!) 162/86   Pulse (!) 57   Temp 98.3 F (36.8 C) (Oral)   Resp 16   Ht 5\' 1"  (1.549 m)   Wt 183 lb (83 kg)   BMI 34.58 kg/m   Wt Readings from Last 3 Encounters:  11/20/15 183 lb (83 kg)  11/13/15 180 lb (81.6 kg)  09/17/15 195 lb 3.2 oz (88.5 kg)    Physical Exam  Constitutional: She is oriented to person, place, and time. She appears well-developed and well-nourished. No distress.  Chronically ill appearing but currently well, discomfort with left knee, cooperative  HENT:  Head: Normocephalic and atraumatic.  Mouth/Throat: Oropharynx is clear and moist.  Eyes: EOM are normal. Pupils are equal, round, and reactive to light.  Neck: Normal range of motion. Neck supple.  Cardiovascular: Normal rate and intact distal pulses.   Pulmonary/Chest: Effort normal.  Musculoskeletal: She exhibits no edema.  Left Knee Inspection: Bulky appearance bilateral mostly symmetrical. No ecchymosis or effusion. Palpation: Mild to moderate left knee tenderness along medial joint line, and direct palpation of patella with compression, bilateral crepitus ROM: Limited Left knee extension and only partial flexion due to pain Strength: Left lower extremity 4/5 knee flex with R 5/5, bilateral ankle plantar/dorsiflex intact 5/5 Neurovascular: distally intact sensation light touch and pulses  Upper extremities Normal tone, moves symmetrical Stable upper extremity Left grip 4/5 and Right grip 5/5 unchanged from last visit  Neurological: She is alert and oriented to person,  place, and time. No cranial nerve deficit.  Distal sensation intact to light touch bilateral upper and lower extremities, including Right upper arm and into forearm / hand fingers all intact.  Gait antalgic due to left knee pain  Skin: Skin is warm and dry. She is not diaphoretic.  Psychiatric: Her behavior is normal.   Nursing note and vitals reviewed.  ________________________________________________________ PROCEDURE NOTE Date: 11/20/15 Left Knee corticosteroid injection Discussed benefits and risks (including pain, bleeding, infection, steroid flare). Verbal consent given by patient. Medication:  1 cc Depo-medrol 40mg  and 4 cc Lidocaine 1% without epi Time Out taken  Landmarks identified. Area cleansed with alcohol wipes.Using 21 gauge and 1, 1/2 inch needle, Left knee joint space was injected (with above listed medication) via lateral approach cold spray used for superficial anesthetic.Sterile bandage placed.Patient tolerated procedure well without bleeding or paresthesias.No complications.      Assessment & Plan:   Problem List Items Addressed This Visit    Osteoarthritis of multiple joints    Underlying etiology for Left knee acute on chronic pain, also concern for underlying L knee injury from recent fall given no improvement - S/p steroid injection today      Relevant Medications   methylPREDNISolone acetate (DEPO-MEDROL) injection 40 mg (Completed)   lidocaine (PF) (XYLOCAINE) 1 % injection 4 mL (Completed)   Other Relevant Orders   Ambulatory referral to Orthopedic Surgery   Left medial knee pain - Primary    Persistent worsening acute on chronic medial Left knee pain, s/p recent traumatic fall. - Last X-rays 11/1 - Failed conservative therapy  Plan: 1. Left knee corticosteroid injection today, see procedure note 2. Referral to Orthopedics [facility]) for further management, may need MRI, possible surgical intervention      Relevant Medications   methylPREDNISolone acetate (DEPO-MEDROL) injection 40 mg (Completed)   lidocaine (PF) (XYLOCAINE) 1 % injection 4 mL (Completed)   Other Relevant Orders   Ambulatory referral to Orthopedic Surgery   Cerebral infarction [facility])    Concern for recent possible TIA episode 1 week ago, with chronic history of prior CVAs with  residual deficits - Continue Aggrenox - Follow-up with [facility] Neurology as scheduled - Advised to seek immediate medical attention if recurrent episode       Other Visit Diagnoses    Need for Streptococcus pneumoniae vaccination       Received PCV13 today 11/20/15, next due for PPSV23 on 11/19/16   Relevant Orders   Pneumococcal conjugate vaccine 13-valent IM (Completed)      Meds ordered this encounter  Medications  . methylPREDNISolone acetate (DEPO-MEDROL) injection 40 mg  . lidocaine (PF) (XYLOCAINE) 1 % injection 4 mL      Follow up plan: Return in about 1 week (around 11/27/2015).  Nobie Putnam, [facility] 11/20/2015, 9:19 PM

## 2015-11-20 NOTE — Patient Instructions (Signed)
Thank you for coming in to clinic today.  1. You received Left knee steroid injection today, the numbing medicine may help initially, and you may experience a steroid flare overnight with some increased pain, but this should improve within 24-48 hours, and hopefully you get relief lasting longer. - Take it easy for next 1-2 days, avoid over activity and walking too much, can use ice packs as needed for relief - Continue medications - Referral to [facility] for follow-up on Left knee  Pneumonia 13 vaccination today - you are due for one more booster in 1 year.  [facility] [address], [place]  [address] Phone: [phone]  Referral information for [facility] ( should be the same office)  [facility] - Neurology Dept [address], [place] [address] Phone: [phone]  Continue aggrenox and if you run low, let the neurology office know.  If you have another concerning episode like before, please contact 911 and seek immediate medical attention.  Follow-up as scheduled with me next week, and we can review lab work.  If you have any other questions or concerns, please feel free to call the clinic or send a message through [place]. You may also schedule an earlier appointment if necessary.  Nobie Putnam, DO [place]

## 2015-11-20 NOTE — Assessment & Plan Note (Signed)
Persistent worsening acute on chronic medial Left knee pain, s/p recent traumatic fall. - Last X-rays 11/1 - Failed conservative therapy  Plan: 1. Left knee corticosteroid injection today, see procedure note 2. Referral to Orthopedics [facility]) for further management, may need MRI, possible surgical intervention

## 2015-11-21 LAB — BASIC METABOLIC PANEL WITH GFR
BUN: 11 mg/dL (ref 7–25)
CALCIUM: 9 mg/dL (ref 8.6–10.4)
CO2: 25 mmol/L (ref 20–31)
CREATININE: 0.78 mg/dL (ref 0.60–0.93)
Chloride: 108 mmol/L (ref 98–110)
GFR, EST AFRICAN AMERICAN: 88 mL/min (ref >=60)
GFR, Est Non African American: 77 mL/min (ref >=60)
Glucose, Bld: 155 mg/dL — ABNORMAL HIGH (ref 65–99)
Potassium: 3.5 mmol/L (ref 3.5–5.3)
SODIUM: 141 mmol/L (ref 135–146)

## 2015-11-23 DIAGNOSIS — R569 Unspecified convulsions: Secondary | ICD-10-CM | POA: Diagnosis not present

## 2015-11-23 DIAGNOSIS — I2510 Atherosclerotic heart disease of native coronary artery without angina pectoris: Secondary | ICD-10-CM | POA: Diagnosis not present

## 2015-11-23 DIAGNOSIS — Z955 Presence of coronary angioplasty implant and graft: Secondary | ICD-10-CM | POA: Diagnosis not present

## 2015-11-23 DIAGNOSIS — I208 Other forms of angina pectoris: Secondary | ICD-10-CM | POA: Diagnosis not present

## 2015-11-23 DIAGNOSIS — E079 Disorder of thyroid, unspecified: Secondary | ICD-10-CM | POA: Diagnosis not present

## 2015-11-23 DIAGNOSIS — E785 Hyperlipidemia, unspecified: Secondary | ICD-10-CM | POA: Diagnosis not present

## 2015-11-23 DIAGNOSIS — R600 Localized edema: Secondary | ICD-10-CM | POA: Diagnosis not present

## 2015-11-23 DIAGNOSIS — E119 Type 2 diabetes mellitus without complications: Secondary | ICD-10-CM | POA: Diagnosis not present

## 2015-11-23 DIAGNOSIS — I10 Essential (primary) hypertension: Secondary | ICD-10-CM | POA: Diagnosis not present

## 2015-11-23 DIAGNOSIS — F17200 Nicotine dependence, unspecified, uncomplicated: Secondary | ICD-10-CM | POA: Diagnosis not present

## 2015-11-23 DIAGNOSIS — J449 Chronic obstructive pulmonary disease, unspecified: Secondary | ICD-10-CM | POA: Diagnosis not present

## 2015-11-23 DIAGNOSIS — I639 Cerebral infarction, unspecified: Secondary | ICD-10-CM | POA: Diagnosis not present

## 2015-11-27 ENCOUNTER — Ambulatory Visit (INDEPENDENT_AMBULATORY_CARE_PROVIDER_SITE_OTHER): Payer: Medicare Other | Admitting: Family Medicine

## 2015-11-27 ENCOUNTER — Encounter: Payer: Self-pay | Admitting: Family Medicine

## 2015-11-27 VITALS — BP 142/76 | HR 53 | Temp 98.2°F | Resp 16 | Ht 61.0 in | Wt 181.0 lb

## 2015-11-27 DIAGNOSIS — E1140 Type 2 diabetes mellitus with diabetic neuropathy, unspecified: Secondary | ICD-10-CM | POA: Diagnosis not present

## 2015-11-27 DIAGNOSIS — I10 Essential (primary) hypertension: Secondary | ICD-10-CM

## 2015-11-27 DIAGNOSIS — I639 Cerebral infarction, unspecified: Secondary | ICD-10-CM | POA: Diagnosis not present

## 2015-11-27 DIAGNOSIS — M25562 Pain in left knee: Secondary | ICD-10-CM | POA: Diagnosis not present

## 2015-11-27 DIAGNOSIS — Z1211 Encounter for screening for malignant neoplasm of colon: Secondary | ICD-10-CM | POA: Diagnosis not present

## 2015-11-27 LAB — POCT GLYCOSYLATED HEMOGLOBIN (HGB A1C): HEMOGLOBIN A1C: 7.2

## 2015-11-27 MED ORDER — LISINOPRIL 20 MG PO TABS: 20.0000 mg | ORAL_TABLET | Freq: Every day | ORAL | 11 refills | Status: DC

## 2015-11-27 NOTE — Patient Instructions (Signed)
Thank you for coming in to clinic today.  1. Blood pressure is slightly improved, overall you are near goal but I think you will benefit from increased dose of Lisinopril - Take 66m daily (finish 133mpills, take 2 per dose) then sent new rx to pharmacy - Write down dose of your old Ramipril for me next time, for the record  2. Check A1c today  3. Please have [facility] [place] report of your Diabetic Eye Exam to update your record  4. I am pleased that the knee injection improved your pain, we will stay tuned with Orthopedics, can consider repeat injection in 3 to 6 months as needed  FOR FUTURE REFERENCE Colon Cancer Screening: - For all adults age 753+outine colon cancer screening is highly recommended. - Early detection of colon cancer is important, because often there are no warning signs or symptoms, also if found early usually it can be cured. Late stage is hard to treat.  - If you are not interested in Colonoscopy screening (if done and normal you could be cleared for 5 to 10 years until next due), then Cologuard is an excellent alternative for screening test for Colon Cancer. It is highly sensitive for detecting DNA of colon cancer from even the earliest stages. Also, there is NO bowel prep required. - If Cologuard is NEGATIVE, then it is good for 3 years before next due - If Cologuard is POSITIVE, then it is strongly advised to get a Colonoscopy, which allows the GI doctor to locate the source of the cancer or polyp (even very early stage) and treat it by removing it. ------------------------- If you would like to proceed with Cologuard (stool DNA test) - FIRST, call your insurance company and tell them you want to check cost of Cologuard tell them CPT Code [phone] may be completely covered and you could get for no cost, OR max cost without any coverage is about $600). Also, keep in mind if you do NOT open the kit, and decide not to do the test, you will NOT be charged,  you should contact the company if you decide not to do the test. - If you want to proceed, you can notify [place] message, [place] at next visit) and we will order it for you. The test kit will be delivered to you house within about 1 week. Follow instructions to collect sample, you may call the company for any help or questions, 24/7 telephone support at [phone] Please schedule a follow-up appointment with Dr. KaParks Rangern 3 months for Diabetes A1c, BP  If you have any other questions or concerns, please feel free to call the clinic or send a message through [place] may also schedule an earlier appointment if necessary.  AlNobie PutnamDO [place]

## 2015-11-27 NOTE — Assessment & Plan Note (Addendum)
Mild elevated BP improved on manual re-check within goal < 140/90, outside BP improved - Previously on Ramipril, unsure dose, consider switch to this if needed  Plan: 1. Increase Lisinopril from 10 to 20mg  daily, for optimal control, reviewed normal BMET recently after ACEi start 2. Follow-up 3 months, check outside BPs

## 2015-11-27 NOTE — Assessment & Plan Note (Signed)
Improved s/p left knee steroid injection, only temporary measure given concern for possible meniscus injury. Can consider repeat in future if lasts >3 months - Awaiting [facility] follow-up

## 2015-11-27 NOTE — Assessment & Plan Note (Signed)
Well controlled, A1c today 7.2 up from 6.6, possible slightly false elevation from POC A1c office machine, discussed this possibility with patient  Plan: 1. Continue current treatment 2. On ACE, statin, ASA, vaccinations, awaiting Ophthalmology for DM exam, has podiatry visit for foot exam 3. Follow-up 3 months DM visit, will check serum A1c, consider switching sulfonylurea for possible hypoglycemia risk but does not seem to be current problem, consider SGLT2 vs GLP1

## 2015-11-27 NOTE — Assessment & Plan Note (Signed)
Due for routine colon cancer screening. Never had colonoscopy, no family history colon cancer. - Discussion today about recommendations for either Colonoscopy or Cologuard screening, benefits and risks of screening, interested in Cologuard, understands that if positive then recommendation is for diagnostic colonoscopy to follow-up. - Patient advised to contact insurance first to learn cost, will notify us when ready for [place] to order Cologuard

## 2015-11-27 NOTE — Progress Notes (Signed)
Subjective:    Patient ID: Samantha Aguirre, female    DOB: [birth year], 70 y.o.   MRN: [id number]  Samantha Aguirre is a 70 y.o. female presenting on 11/27/2015 for Follow-up (blood work)   HPI   CHRONIC HTN: Reports previously on other HTN agents including Atenolol and Verapmail, last visit 10/31 established care started on ACEi with Lisinopril 10mg  daily. Reviewed lab results with BMET normal K and Cr after starting ACE. Today she reports was treated with Ramipril but does not know dose. Prior tolerated ACEi without cough or other issues. Current Meds - Lisinopril 10mg  daily   Reports good compliance, took meds today. Tolerating well, w/o complaints. Lifestyle - Limited exercise due to left knee pain - Recently established with Cardiology at [facility], she was scheduled for 11/29 for stress test Denies CP, dyspnea, HA, edema, dizziness / lightheadedness  CHRONIC DM, Type 2: Reports no concerns, due for A1c, does not check CBGs Meds: Glipzide-Metformin 5-500mg  2AM and 1 PM Reports good compliance. Tolerating well w/o side-effects Currently on ACEi (prior microalbumin normal) Lifestyle: Diet (limited soda, not always following DM diet Denies hypoglycemia, polyuria, visual changes, numbness or tingling.  FOLLOW-UP LEFT Knee Pain with chronic DJD / h/o tibial fracture: - Last seen by me on 11/20/15 for Left knee injection and referral to [facility], with prior concern for meniscus or ligament injury - Today she reports moderate improvement in pain from steroid injection, still some mild pain, trying to avoid over activity or re-injury - Denies any new injury / fall / trauma, joint redness or swelling, other joint pain, fever/chills    Social History  Substance Use Topics  . Smoking status: Current Every Day Smoker    Packs/day: 0.50    Years: 50.00    Types: Cigarettes  . Smokeless tobacco: Current User  . Alcohol use No    Review of Systems Per HPI unless  specifically indicated above     Objective:    BP (!) 142/76 (BP Location: Left Arm, Cuff Size: Normal)   Pulse (!) 53   Temp 98.2 F (36.8 C) (Oral)   Resp 16   Ht 5\' 1"  (1.549 m)   Wt 181 lb (82.1 kg)   BMI 34.20 kg/m   Wt Readings from Last 3 Encounters:  11/27/15 181 lb (82.1 kg)  11/20/15 183 lb (83 kg)  11/13/15 180 lb (81.6 kg)    Physical Exam  Constitutional: She is oriented to person, place, and time. She appears well-developed and well-nourished. No distress.  Chronically ill appearing but currently well, comfortable, cooperative  HENT:  Head: Normocephalic and atraumatic.  Mouth/Throat: Oropharynx is clear and moist.  Cardiovascular: Normal rate, regular rhythm, normal heart sounds and intact distal pulses.   No murmur heard. Pulmonary/Chest: Effort normal and breath sounds normal. No respiratory distress. She has no wheezes. She has no rales.  Musculoskeletal: She exhibits no edema.  Neurological: She is alert and oriented to person, place, and time.  Improved antalgic gait with left knee pain  Skin: Skin is warm and dry. She is not diaphoretic.  Psychiatric: She has a normal mood and affect. Her behavior is normal.  Nursing note and vitals reviewed.     Assessment & Plan:   Problem List Items Addressed This Visit    Left medial knee pain    Improved s/p left knee steroid injection, only temporary measure given concern for possible meniscus injury. Can consider repeat in future if lasts >3 months - Awaiting [facility]  Ortho follow-up      Hypertension - Primary    Mild elevated BP improved on manual re-check within goal < 140/90, outside BP improved - Previously on Ramipril, unsure dose, consider switch to this if needed  Plan: 1. Increase Lisinopril from 10 to 20mg  daily, for optimal control, reviewed normal BMET recently after ACEi start 2. Follow-up 3 months, check outside BPs      Relevant Medications   lisinopril (PRINIVIL,ZESTRIL) 20 MG tablet    Controlled type 2 diabetes mellitus with diabetic neuropathy ([place])    Well controlled, A1c today 7.2 up from 6.6, possible slightly false elevation from POC A1c office machine, discussed this possibility with patient  Plan: 1. Continue current treatment 2. On ACE, statin, ASA, vaccinations, awaiting Ophthalmology for DM exam, has podiatry visit for foot exam 3. Follow-up 3 months DM visit, will check serum A1c, consider switching sulfonylurea for possible hypoglycemia risk but does not seem to be current problem, consider SGLT2 vs GLP1       Relevant Medications   lisinopril (PRINIVIL,ZESTRIL) 20 MG tablet   Other Relevant Orders   POCT glycosylated hemoglobin (Hb A1C) (Completed)   Colon cancer screening    Due for routine colon cancer screening. Never had colonoscopy, no family history colon cancer. - Discussion today about recommendations for either Colonoscopy or Cologuard screening, benefits and risks of screening, interested in Cologuard, understands that if positive then recommendation is for diagnostic colonoscopy to follow-up. - Patient advised to contact insurance first to learn cost, will notify us when ready for [place] to order Cologuard          Meds ordered this encounter  Medications  . lisinopril (PRINIVIL,ZESTRIL) 20 MG tablet    Sig: Take 1 tablet (20 mg total) by mouth daily.    Dispense:  30 tablet    Refill:  11      Follow up plan: Return in about 3 months (around 02/27/2016) for diabetes, blood pressure.  Nobie Putnam, DO [facility] 11/27/2015, 10:23 PM

## 2015-12-05 DIAGNOSIS — R251 Tremor, unspecified: Secondary | ICD-10-CM | POA: Diagnosis not present

## 2015-12-05 DIAGNOSIS — R569 Unspecified convulsions: Secondary | ICD-10-CM | POA: Diagnosis not present

## 2015-12-05 DIAGNOSIS — Z8673 Personal history of transient ischemic attack (TIA), and cerebral infarction without residual deficits: Secondary | ICD-10-CM | POA: Diagnosis not present

## 2015-12-27 ENCOUNTER — Emergency Department: Payer: Medicare Other

## 2015-12-27 ENCOUNTER — Encounter: Payer: Self-pay | Admitting: Emergency Medicine

## 2015-12-27 ENCOUNTER — Observation Stay
Admission: EM | Admit: 2015-12-27 | Discharge: 2016-01-01 | Disposition: A | Discharge status: Home / Self Care | Payer: Medicare Other | Attending: Internal Medicine | Admitting: Internal Medicine

## 2015-12-27 DIAGNOSIS — Z885 Allergy status to narcotic agent status: Secondary | ICD-10-CM | POA: Insufficient documentation

## 2015-12-27 DIAGNOSIS — Z66 Do not resuscitate: Secondary | ICD-10-CM | POA: Insufficient documentation

## 2015-12-27 DIAGNOSIS — R05 Cough: Secondary | ICD-10-CM | POA: Diagnosis not present

## 2015-12-27 DIAGNOSIS — M170 Bilateral primary osteoarthritis of knee: Secondary | ICD-10-CM | POA: Insufficient documentation

## 2015-12-27 DIAGNOSIS — I5032 Chronic diastolic (congestive) heart failure: Secondary | ICD-10-CM | POA: Insufficient documentation

## 2015-12-27 DIAGNOSIS — Z85828 Personal history of other malignant neoplasm of skin: Secondary | ICD-10-CM | POA: Insufficient documentation

## 2015-12-27 DIAGNOSIS — R0602 Shortness of breath: Secondary | ICD-10-CM | POA: Diagnosis not present

## 2015-12-27 DIAGNOSIS — G3189 Other specified degenerative diseases of nervous system: Secondary | ICD-10-CM | POA: Diagnosis not present

## 2015-12-27 DIAGNOSIS — M19041 Primary osteoarthritis, right hand: Secondary | ICD-10-CM | POA: Insufficient documentation

## 2015-12-27 DIAGNOSIS — M545 Low back pain: Secondary | ICD-10-CM | POA: Insufficient documentation

## 2015-12-27 DIAGNOSIS — G8929 Other chronic pain: Secondary | ICD-10-CM | POA: Insufficient documentation

## 2015-12-27 DIAGNOSIS — M069 Rheumatoid arthritis, unspecified: Secondary | ICD-10-CM | POA: Diagnosis not present

## 2015-12-27 DIAGNOSIS — E782 Mixed hyperlipidemia: Secondary | ICD-10-CM | POA: Diagnosis not present

## 2015-12-27 DIAGNOSIS — I2510 Atherosclerotic heart disease of native coronary artery without angina pectoris: Secondary | ICD-10-CM | POA: Insufficient documentation

## 2015-12-27 DIAGNOSIS — M19071 Primary osteoarthritis, right ankle and foot: Secondary | ICD-10-CM | POA: Diagnosis not present

## 2015-12-27 DIAGNOSIS — I69351 Hemiplegia and hemiparesis following cerebral infarction affecting right dominant side: Secondary | ICD-10-CM | POA: Insufficient documentation

## 2015-12-27 DIAGNOSIS — I110 Hypertensive heart disease with heart failure: Secondary | ICD-10-CM | POA: Insufficient documentation

## 2015-12-27 DIAGNOSIS — Z7984 Long term (current) use of oral hypoglycemic drugs: Secondary | ICD-10-CM | POA: Insufficient documentation

## 2015-12-27 DIAGNOSIS — M19072 Primary osteoarthritis, left ankle and foot: Secondary | ICD-10-CM | POA: Insufficient documentation

## 2015-12-27 DIAGNOSIS — F17210 Nicotine dependence, cigarettes, uncomplicated: Secondary | ICD-10-CM | POA: Diagnosis not present

## 2015-12-27 DIAGNOSIS — R262 Difficulty in walking, not elsewhere classified: Secondary | ICD-10-CM

## 2015-12-27 DIAGNOSIS — J441 Chronic obstructive pulmonary disease with (acute) exacerbation: Principal | ICD-10-CM | POA: Diagnosis present

## 2015-12-27 DIAGNOSIS — F329 Major depressive disorder, single episode, unspecified: Secondary | ICD-10-CM | POA: Diagnosis not present

## 2015-12-27 DIAGNOSIS — R51 Headache: Secondary | ICD-10-CM | POA: Diagnosis not present

## 2015-12-27 DIAGNOSIS — R569 Unspecified convulsions: Secondary | ICD-10-CM | POA: Insufficient documentation

## 2015-12-27 DIAGNOSIS — S0990XA Unspecified injury of head, initial encounter: Secondary | ICD-10-CM | POA: Diagnosis not present

## 2015-12-27 DIAGNOSIS — Z91041 Radiographic dye allergy status: Secondary | ICD-10-CM | POA: Insufficient documentation

## 2015-12-27 DIAGNOSIS — E038 Other specified hypothyroidism: Secondary | ICD-10-CM | POA: Insufficient documentation

## 2015-12-27 DIAGNOSIS — I700 Atherosclerosis of aorta: Secondary | ICD-10-CM | POA: Insufficient documentation

## 2015-12-27 DIAGNOSIS — E7800 Pure hypercholesterolemia, unspecified: Secondary | ICD-10-CM | POA: Insufficient documentation

## 2015-12-27 DIAGNOSIS — R296 Repeated falls: Secondary | ICD-10-CM

## 2015-12-27 DIAGNOSIS — Z888 Allergy status to other drugs, medicaments and biological substances status: Secondary | ICD-10-CM | POA: Insufficient documentation

## 2015-12-27 DIAGNOSIS — M19042 Primary osteoarthritis, left hand: Secondary | ICD-10-CM | POA: Insufficient documentation

## 2015-12-27 DIAGNOSIS — E1140 Type 2 diabetes mellitus with diabetic neuropathy, unspecified: Secondary | ICD-10-CM | POA: Diagnosis not present

## 2015-12-27 DIAGNOSIS — Z716 Tobacco abuse counseling: Secondary | ICD-10-CM | POA: Diagnosis not present

## 2015-12-27 DIAGNOSIS — M6281 Muscle weakness (generalized): Secondary | ICD-10-CM

## 2015-12-27 DIAGNOSIS — E785 Hyperlipidemia, unspecified: Secondary | ICD-10-CM | POA: Diagnosis not present

## 2015-12-27 DIAGNOSIS — K219 Gastro-esophageal reflux disease without esophagitis: Secondary | ICD-10-CM | POA: Diagnosis not present

## 2015-12-27 DIAGNOSIS — I252 Old myocardial infarction: Secondary | ICD-10-CM | POA: Diagnosis not present

## 2015-12-27 DIAGNOSIS — Z91018 Allergy to other foods: Secondary | ICD-10-CM | POA: Insufficient documentation

## 2015-12-27 DIAGNOSIS — Z90710 Acquired absence of both cervix and uterus: Secondary | ICD-10-CM | POA: Insufficient documentation

## 2015-12-27 DIAGNOSIS — E876 Hypokalemia: Secondary | ICD-10-CM | POA: Diagnosis not present

## 2015-12-27 LAB — BASIC METABOLIC PANEL
Anion gap: 7 (ref 5–15)
BUN: 11 mg/dL (ref 6–20)
CALCIUM: 9.3 mg/dL (ref 8.9–10.3)
CO2: 27 mmol/L (ref 22–32)
CREATININE: 0.95 mg/dL (ref 0.44–1.00)
Chloride: 105 mmol/L (ref 101–111)
GFR calc Af Amer: >60 mL/min (ref >60)
GFR calc non Af Amer: 59 mL/min — ABNORMAL LOW (ref >60)
Glucose, Bld: 149 mg/dL — ABNORMAL HIGH (ref 65–99)
Potassium: 3.1 mmol/L — ABNORMAL LOW (ref 3.5–5.1)
Sodium: 139 mmol/L (ref 135–145)

## 2015-12-27 LAB — GLUCOSE, CAPILLARY
GLUCOSE-CAPILLARY: 288 mg/dL — AB (ref 65–99)
GLUCOSE-CAPILLARY: 238 mg/dL — AB (ref 65–99)

## 2015-12-27 LAB — CBC
HCT: 39.4 % (ref 35.0–47.0)
Hemoglobin: 13.4 g/dL (ref 12.0–16.0)
MCH: 29.2 pg (ref 26.0–34.0)
MCHC: 34 g/dL (ref 32.0–36.0)
MCV: 85.9 fL (ref 80.0–100.0)
PLATELETS: 202 10*3/uL (ref 150–440)
RBC: 4.59 MIL/uL (ref 3.80–5.20)
RDW: 17.1 % — AB (ref 11.5–14.5)
WBC: 5.5 10*3/uL (ref 3.6–11.0)

## 2015-12-27 LAB — TROPONIN I

## 2015-12-27 LAB — INFLUENZA PANEL BY PCR (TYPE A & B)
INFLAPCR: NEGATIVE
Influenza B By PCR: NEGATIVE

## 2015-12-27 LAB — BRAIN NATRIURETIC PEPTIDE: B Natriuretic Peptide: 17 pg/mL (ref 0.0–100.0)

## 2015-12-27 MED ORDER — GLIPIZIDE-METFORMIN HCL 5-500 MG PO TABS: 1.0000 | ORAL_TABLET | Freq: Two times a day (BID) | ORAL | Status: DC

## 2015-12-27 MED ORDER — AZITHROMYCIN 500 MG PO TABS
500.0000 mg | ORAL_TABLET | Freq: Every day | ORAL | Status: AC
  Administered 2015-12-27: 500 mg via ORAL

## 2015-12-27 MED ORDER — LISINOPRIL 20 MG PO TABS
20.0000 mg | ORAL_TABLET | Freq: Every day | ORAL | Status: DC
  Administered 2015-12-28 – 2016-01-01 (×5): 20 mg via ORAL
  Filled 2015-12-27 (×5): qty 1

## 2015-12-27 MED ORDER — AZITHROMYCIN 250 MG PO TABS
250.0000 mg | ORAL_TABLET | Freq: Every day | ORAL | Status: AC
  Administered 2015-12-28 – 2015-12-31 (×4): 250 mg via ORAL
  Filled 2015-12-27 (×4): qty 1

## 2015-12-27 MED ORDER — IPRATROPIUM-ALBUTEROL 0.5-2.5 (3) MG/3ML IN SOLN
RESPIRATORY_TRACT | Status: AC
  Administered 2015-12-27: 3 mL via RESPIRATORY_TRACT
  Filled 2015-12-27: qty 3

## 2015-12-27 MED ORDER — IPRATROPIUM-ALBUTEROL 0.5-2.5 (3) MG/3ML IN SOLN
3.0000 mL | Freq: Four times a day (QID) | RESPIRATORY_TRACT | Status: DC
  Administered 2015-12-27 – 2015-12-30 (×10): 3 mL via RESPIRATORY_TRACT
  Filled 2015-12-27 (×12): qty 3

## 2015-12-27 MED ORDER — METHYLPREDNISOLONE SODIUM SUCC 125 MG IJ SOLR
60.0000 mg | Freq: Two times a day (BID) | INTRAMUSCULAR | Status: DC
  Administered 2015-12-27 – 2016-01-01 (×10): 60 mg via INTRAVENOUS
  Filled 2015-12-27 (×10): qty 2

## 2015-12-27 MED ORDER — GLIPIZIDE 10 MG PO TABS
5.0000 mg | ORAL_TABLET | Freq: Two times a day (BID) | ORAL | Status: DC
  Administered 2015-12-27 – 2016-01-01 (×10): 5 mg via ORAL
  Filled 2015-12-27 (×10): qty 1

## 2015-12-27 MED ORDER — ENOXAPARIN SODIUM 40 MG/0.4ML ~~LOC~~ SOLN
40.0000 mg | SUBCUTANEOUS | Status: DC
  Administered 2015-12-27 – 2015-12-31 (×5): 40 mg via SUBCUTANEOUS
  Filled 2015-12-27 (×5): qty 0.4

## 2015-12-27 MED ORDER — IPRATROPIUM-ALBUTEROL 0.5-2.5 (3) MG/3ML IN SOLN
3.0000 mL | Freq: Once | RESPIRATORY_TRACT | Status: AC
  Administered 2015-12-27: 3 mL via RESPIRATORY_TRACT

## 2015-12-27 MED ORDER — ACETAMINOPHEN 650 MG RE SUPP: 650.0000 mg | Freq: Four times a day (QID) | RECTAL | Status: DC | PRN

## 2015-12-27 MED ORDER — ALBUTEROL SULFATE (2.5 MG/3ML) 0.083% IN NEBU
5.0000 mg | INHALATION_SOLUTION | Freq: Once | RESPIRATORY_TRACT | Status: AC
  Administered 2015-12-27: 5 mg via RESPIRATORY_TRACT
  Filled 2015-12-27: qty 6

## 2015-12-27 MED ORDER — GABAPENTIN 800 MG PO TABS
800.0000 mg | ORAL_TABLET | Freq: Two times a day (BID) | ORAL | Status: DC
  Filled 2015-12-27: qty 1

## 2015-12-27 MED ORDER — DULOXETINE HCL 60 MG PO CPEP
60.0000 mg | ORAL_CAPSULE | Freq: Every day | ORAL | Status: DC
  Administered 2015-12-27 – 2015-12-31 (×5): 60 mg via ORAL
  Filled 2015-12-27 (×5): qty 1

## 2015-12-27 MED ORDER — ASPIRIN-DIPYRIDAMOLE ER 25-200 MG PO CP12
1.0000 | ORAL_CAPSULE | Freq: Two times a day (BID) | ORAL | Status: DC
  Administered 2015-12-27 – 2016-01-01 (×10): 1 via ORAL
  Filled 2015-12-27 (×11): qty 1

## 2015-12-27 MED ORDER — MAGNESIUM SULFATE 2 GM/50ML IV SOLN
INTRAVENOUS | Status: AC
  Filled 2015-12-27: qty 50

## 2015-12-27 MED ORDER — POTASSIUM CHLORIDE CRYS ER 20 MEQ PO TBCR
EXTENDED_RELEASE_TABLET | ORAL | Status: AC
  Filled 2015-12-27: qty 2

## 2015-12-27 MED ORDER — METFORMIN HCL 500 MG PO TABS
500.0000 mg | ORAL_TABLET | Freq: Two times a day (BID) | ORAL | Status: DC
  Administered 2015-12-27 – 2016-01-01 (×10): 500 mg via ORAL
  Filled 2015-12-27 (×10): qty 1

## 2015-12-27 MED ORDER — MAGNESIUM SULFATE 2 GM/50ML IV SOLN: 2.0000 g | Freq: Once | INTRAVENOUS | Status: DC

## 2015-12-27 MED ORDER — POTASSIUM CHLORIDE CRYS ER 20 MEQ PO TBCR
40.0000 meq | EXTENDED_RELEASE_TABLET | Freq: Once | ORAL | Status: AC
  Administered 2015-12-27: 40 meq via ORAL

## 2015-12-27 MED ORDER — ROSUVASTATIN CALCIUM 20 MG PO TABS
40.0000 mg | ORAL_TABLET | Freq: Every day | ORAL | Status: DC
  Administered 2015-12-27 – 2015-12-31 (×5): 40 mg via ORAL
  Filled 2015-12-27 (×2): qty 2
  Filled 2015-12-27: qty 1
  Filled 2015-12-27 (×3): qty 2

## 2015-12-27 MED ORDER — ACETAMINOPHEN 325 MG PO TABS
650.0000 mg | ORAL_TABLET | Freq: Four times a day (QID) | ORAL | Status: DC | PRN
  Administered 2015-12-27 – 2015-12-29 (×3): 650 mg via ORAL
  Filled 2015-12-27 (×3): qty 2

## 2015-12-27 MED ORDER — LEVETIRACETAM 500 MG PO TABS
500.0000 mg | ORAL_TABLET | Freq: Two times a day (BID) | ORAL | Status: DC
  Administered 2015-12-27 – 2016-01-01 (×10): 500 mg via ORAL
  Filled 2015-12-27 (×10): qty 1

## 2015-12-27 MED ORDER — GABAPENTIN 400 MG PO CAPS
800.0000 mg | ORAL_CAPSULE | Freq: Two times a day (BID) | ORAL | Status: DC
  Administered 2015-12-27 – 2016-01-01 (×11): 800 mg via ORAL
  Filled 2015-12-27 (×11): qty 2

## 2015-12-27 MED ORDER — BACLOFEN 10 MG PO TABS: 5.0000 mg | ORAL_TABLET | Freq: Three times a day (TID) | ORAL | Status: DC | PRN

## 2015-12-27 MED ORDER — INSULIN ASPART 100 UNIT/ML ~~LOC~~ SOLN
0.0000 [IU] | Freq: Every day | SUBCUTANEOUS | Status: DC
  Administered 2015-12-27 – 2015-12-28 (×2): 2 [IU] via SUBCUTANEOUS
  Filled 2015-12-27: qty 2
  Filled 2015-12-27: qty 5
  Filled 2015-12-27: qty 2

## 2015-12-27 MED ORDER — INSULIN ASPART 100 UNIT/ML ~~LOC~~ SOLN
0.0000 [IU] | Freq: Three times a day (TID) | SUBCUTANEOUS | Status: DC
  Administered 2015-12-27: 5 [IU] via SUBCUTANEOUS
  Administered 2015-12-28: 3 [IU] via SUBCUTANEOUS
  Administered 2015-12-28: 5 [IU] via SUBCUTANEOUS
  Administered 2015-12-28 – 2015-12-29 (×2): 3 [IU] via SUBCUTANEOUS
  Administered 2015-12-29: 5 [IU] via SUBCUTANEOUS
  Administered 2015-12-29: 3 [IU] via SUBCUTANEOUS
  Administered 2015-12-30: 2 [IU] via SUBCUTANEOUS
  Administered 2015-12-30: 1 [IU] via SUBCUTANEOUS
  Administered 2015-12-30 – 2015-12-31 (×3): 2 [IU] via SUBCUTANEOUS
  Administered 2015-12-31: 3 [IU] via SUBCUTANEOUS
  Administered 2016-01-01 (×2): 2 [IU] via SUBCUTANEOUS
  Filled 2015-12-27 (×2): qty 2
  Filled 2015-12-27: qty 1
  Filled 2015-12-27: qty 3
  Filled 2015-12-27: qty 2
  Filled 2015-12-27: qty 5
  Filled 2015-12-27 (×2): qty 3
  Filled 2015-12-27: qty 2
  Filled 2015-12-27: qty 5
  Filled 2015-12-27 (×2): qty 3

## 2015-12-27 MED ORDER — BUDESONIDE 0.25 MG/2ML IN SUSP
0.2500 mg | Freq: Two times a day (BID) | RESPIRATORY_TRACT | Status: DC
  Administered 2015-12-27 – 2016-01-01 (×10): 0.25 mg via RESPIRATORY_TRACT
  Filled 2015-12-27 (×10): qty 2

## 2015-12-27 MED ORDER — METHYLPREDNISOLONE SODIUM SUCC 125 MG IJ SOLR
125.0000 mg | Freq: Once | INTRAMUSCULAR | Status: AC
  Administered 2015-12-27: 125 mg via INTRAVENOUS
  Filled 2015-12-27: qty 2

## 2015-12-27 MED ORDER — AZITHROMYCIN 250 MG PO TABS
ORAL_TABLET | ORAL | Status: AC
  Filled 2015-12-27: qty 2

## 2015-12-27 MED ORDER — LEVOTHYROXINE SODIUM 50 MCG PO TABS
125.0000 ug | ORAL_TABLET | ORAL | Status: DC
  Administered 2015-12-27 – 2015-12-31 (×3): 125 ug via ORAL
  Filled 2015-12-27: qty 2
  Filled 2015-12-27 (×3): qty 3
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
## 2015-12-27 NOTE — ED Notes (Signed)
Pt reports feeling "a little better." Some expiratory wheezes present, especially in right lung, but this is marked improvement from earlier.
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
## 2015-12-27 NOTE — Progress Notes (Signed)
Patient ID: Samantha Aguirre, female   DOB: [birth year], 70 y.o.   MRN: [id number]  ACP discussion  Because of the patient's multiple medical issues including COPD, rheumatoid arthritis, CAD, CHF. I discussed CODE STATUS with her. The patient does not wish to be resuscitated if her heart would stop. She would like to be a DO NOT RESUSCITATE.  DO NOT RESUSCITATE ordered.  ACP discussion time 17 minutes.

## 2015-12-27 NOTE — ED Triage Notes (Signed)
Pt presents to ED with c/o chest pain that started this morning that she describes as L sided intermittent pain. Pt states she has had a cough with SHOB since yesterday. Pt states she was "coughing so much yesterday [her] back started to hurt from the coughing". Pt is alert and oriented at this time.
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
## 2015-12-28 DIAGNOSIS — E876 Hypokalemia: Secondary | ICD-10-CM | POA: Diagnosis not present

## 2015-12-28 DIAGNOSIS — J441 Chronic obstructive pulmonary disease with (acute) exacerbation: Secondary | ICD-10-CM | POA: Diagnosis not present

## 2015-12-28 DIAGNOSIS — E785 Hyperlipidemia, unspecified: Secondary | ICD-10-CM | POA: Diagnosis not present

## 2015-12-28 DIAGNOSIS — E1140 Type 2 diabetes mellitus with diabetic neuropathy, unspecified: Secondary | ICD-10-CM | POA: Diagnosis not present

## 2015-12-28 LAB — BASIC METABOLIC PANEL
ANION GAP: 9 (ref 5–15)
BUN: 10 mg/dL (ref 6–20)
CO2: 23 mmol/L (ref 22–32)
Calcium: 9.3 mg/dL (ref 8.9–10.3)
Chloride: 108 mmol/L (ref 101–111)
Creatinine, Ser: 0.89 mg/dL (ref 0.44–1.00)
GLUCOSE: 244 mg/dL — AB (ref 65–99)
POTASSIUM: 4.1 mmol/L (ref 3.5–5.1)
Sodium: 140 mmol/L (ref 135–145)

## 2015-12-28 LAB — GLUCOSE, CAPILLARY
GLUCOSE-CAPILLARY: 251 mg/dL — AB (ref 65–99)
GLUCOSE-CAPILLARY: 228 mg/dL — AB (ref 65–99)
Glucose-Capillary: 215 mg/dL — ABNORMAL HIGH (ref 65–99)
Glucose-Capillary: 255 mg/dL — ABNORMAL HIGH (ref 65–99)

## 2015-12-28 LAB — CBC
HEMATOCRIT: 36.9 % (ref 35.0–47.0)
HEMOGLOBIN: 12.5 g/dL (ref 12.0–16.0)
MCH: 29.6 pg (ref 26.0–34.0)
MCHC: 33.8 g/dL (ref 32.0–36.0)
MCV: 87.8 fL (ref 80.0–100.0)
Platelets: 181 10*3/uL (ref 150–440)
RBC: 4.21 MIL/uL (ref 3.80–5.20)
RDW: 17.8 % — ABNORMAL HIGH (ref 11.5–14.5)
WBC: 6.1 10*3/uL (ref 3.6–11.0)

## 2015-12-28 MED ORDER — BUTALBITAL-APAP-CAFFEINE 50-325-40 MG PO TABS
1.0000 | ORAL_TABLET | Freq: Four times a day (QID) | ORAL | Status: DC | PRN
  Administered 2015-12-28 – 2015-12-31 (×3): 1 via ORAL
  Filled 2015-12-28 (×3): qty 1

## 2015-12-28 MED ORDER — INSULIN ASPART 100 UNIT/ML ~~LOC~~ SOLN
4.0000 [IU] | Freq: Three times a day (TID) | SUBCUTANEOUS | Status: DC
  Administered 2015-12-28 – 2016-01-01 (×12): 4 [IU] via SUBCUTANEOUS
  Filled 2015-12-28 (×10): qty 4

## 2015-12-28 NOTE — Evaluation (Signed)
Physical Therapy Evaluation Patient Details Name: Samantha Aguirre MRN: [id number] DOB: Nov 24, 1945 Today's Date: 12/28/2015   History of Present Illness  presented to secondary to cough, SOB, fever and chills; admitted with acute COPD exacerbation.  Clinical Impression  Upon evaluation, patient alert and oriented; follows all commands and demonstrates fair insight/safety awareness.  Bilat UE/LE strength and ROM grossly symmetrical and WFL for basic transfers and mobility.  Able to complete bed mobility with mod indep; sit/stand, basic transfers and gait (220') with RW, cga.  LE's "dancing" with initial transition to stand (multi-directional, spontaneous stepping-patient reports having no control over); no LOB associated.  Mild sway with gait efforts with noted decrease in cadence and overall gait speed, both indicative of increased fall risk.  Recommend continued use of RW with all mobility; patient voiced understanding. Would benefit from skilled PT to address above deficits and promote optimal return to PLOF; Recommend transition to [place] upon discharge from acute hospitalization.     Follow Up Recommendations Home health PT    Equipment Recommendations   (reports has RW at home)    Recommendations for Other Services       Precautions / Restrictions Precautions Precautions: Fall Precaution Comments: seizure history (last seizure approx 2 days prior per patient; able to tell when seizure coming on) Restrictions Weight Bearing Restrictions: No      Mobility  Bed Mobility Overal bed mobility: Modified Independent                Transfers Overall transfer level: Needs assistance Equipment used: Rolling walker (2 wheeled) Transfers: Sit to/from Stand Sit to Stand: Min guard         General transfer comment: intermittent, spontaneous stepping ("dancing" per patient) of LEs with initial transition to stand (multidirectional, shuffling; returns to neutral after 3-4  steps)  Ambulation/Gait Ambulation/Gait assistance: Min guard Ambulation Distance (Feet): 220 Feet Assistive device: Rolling walker (2 wheeled)   Gait velocity: 10' walk time, 11-12 seconds, indicative of increased fall risk   General Gait Details: reciprocal stepping pattern with decresaed cadence/gait speed.  Able to complete head turns without buckling/LOB; fair awareness of need for activity pacing (single standing rest period due to fatigue/dizziness)  Stairs            Wheelchair Mobility    Modified Rankin (Stroke Patients Only)       Balance Overall balance assessment: Needs assistance Sitting-balance support: No upper extremity supported;Feet supported Sitting balance-Leahy Scale: Good     Standing balance support: Bilateral upper extremity supported Standing balance-Leahy Scale: Fair                               Pertinent Vitals/Pain Pain Assessment: Faces Faces Pain Scale: Hurts little more Pain Location: generalized sorness Pain Descriptors / Indicators: Aching;Sore Pain Intervention(s): Monitored during session;Repositioned;Limited activity within patient's tolerance    Home Living Family/patient expects to be discharged to:: Private residence Living Arrangements: Children Available Help at Discharge: Family;Available PRN/intermittently Type of Home: Mobile home Home Access: Stairs to enter Entrance Stairs-Rails: None Entrance Stairs-Number of Steps: 2 Home Layout: One level Home Equipment: Walker - 2 wheels;Cane - single point;Electric scooter      Prior Function Level of Independence: Needs assistance         Comments: Furniture walker without assist device at baseline; RW for longer (but limited) community distances. Does not drive. Gets assist at times for bathing. Does endorse multiple fall history, at  least 5 in previous six months (unable to relate cause, "just end up on the floor")     Hand Dominance   Dominant Hand:  Right    Extremity/Trunk Assessment   Upper Extremity Assessment Upper Extremity Assessment:  (grossly at least 4/5 throughout; bilat shoulder elevation limited to shoulder height)    Lower Extremity Assessment Lower Extremity Assessment: Overall WFL for tasks assessed (grossly at least 4/5 throughout)       Communication   Communication: No difficulties  Cognition Arousal/Alertness: Awake/alert Behavior During Therapy: WFL for tasks assessed/performed Overall Cognitive Status: Within Functional Limits for tasks assessed                      General Comments      Exercises     Assessment/Plan    PT Assessment Patient needs continued PT services  PT Problem List Decreased balance;Decreased mobility;Decreased activity tolerance;Decreased knowledge of use of DME;Decreased safety awareness;Decreased knowledge of precautions;Cardiopulmonary status limiting activity          PT Treatment Interventions DME instruction;Gait training;Stair training;Functional mobility training;Therapeutic activities;Therapeutic exercise;Balance training;Patient/family education    PT Goals (Current goals can be found in the Care Plan section)  Acute Rehab PT Goals Patient Stated Goal: to return home when I'm ready PT Goal Formulation: With patient Time For Goal Achievement: 01/11/16 Potential to Achieve Goals: Good    Frequency Min 2X/week   Barriers to discharge Decreased caregiver support      Co-evaluation               End of Session Equipment Utilized During Treatment: Gait belt Activity Tolerance: Patient tolerated treatment well Patient left: in chair;with call bell/phone within reach;with chair alarm set      Functional Assessment Tool Used: clinical judgement, 10' walk time Functional Limitation: Mobility: Walking and moving around Mobility: Walking and Moving Around Current Status [phone]): At least 20 percent but less than 40 percent impaired, limited or  restricted Mobility: Walking and Moving Around Goal Status [phone]): At least 1 percent but less than 20 percent impaired, limited or restricted    Time: 0953-1015 PT Time Calculation (min) (ACUTE ONLY): 22 min   Charges:   PT Evaluation $PT Eval Low Complexity: 1 Procedure     PT G Codes:   PT G-Codes **NOT FOR INPATIENT CLASS** Functional Assessment Tool Used: clinical judgement, 10' walk time Functional Limitation: Mobility: Walking and moving around Mobility: Walking and Moving Around Current Status [id number]): At least 20 percent but less than 40 percent impaired, limited or restricted Mobility: Walking and Moving Around Goal Status [phone]): At least 1 percent but less than 20 percent impaired, limited or restricted    Guilianna Mckoy H. Owens Shark, PT, DPT, NCS 12/28/15, 10:44 AM [phone]

## 2015-12-28 NOTE — Progress Notes (Signed)
[facility] [facility] [place] NAME: Iva Bjorkman    MR#:  [id number]  DATE OF BIRTH:  02/02/1945  SUBJECTIVE:  CHIEF COMPLAINT:   Chief Complaint  Patient presents with  . Cough  . Shortness of Breath  . Back Pain  . Chest Pain  still has excessive coughing and feels SOB, requesting something for headache REVIEW OF SYSTEMS:  Review of Systems  Constitutional: Negative for chills, fever and weight loss.  HENT: Negative for nosebleeds and sore throat.   Eyes: Negative for blurred vision.  Respiratory: Positive for cough, shortness of breath and wheezing.   Cardiovascular: Negative for chest pain, orthopnea, leg swelling and PND.  Gastrointestinal: Negative for abdominal pain, constipation, diarrhea, heartburn, nausea and vomiting.  Genitourinary: Negative for dysuria and urgency.  Musculoskeletal: Negative for back pain.  Skin: Negative for rash.  Neurological: Negative for dizziness, speech change, focal weakness and headaches.  Endo/Heme/Allergies: Does not bruise/bleed easily.  Psychiatric/Behavioral: Negative for depression.   DRUG ALLERGIES:   Allergies  Allergen Reactions  . Mushroom Extract Complex Anaphylaxis    Made patient "deathly sick"  . Atenolol     "MD took me off of it bc it was doing something wrong" Made patient HYPOTENSIVE  . Ivp Dye [Iodinated Diagnostic Agents] Itching    Pt injected with IV contrast.  5 min after injection pt complained of itching behind ear and on her abd.  . Morphine And Related Other (See Comments)    "stops my breathing" NEAR-RESPIRATORY ARREST  . Percocet [Oxycodone-Acetaminophen] Nausea And Vomiting and Other (See Comments)    Stomach pains  . Percodan [Oxycodone-Aspirin] Nausea And Vomiting and Other (See Comments)    Stomach pains  . Verapamil Other (See Comments)    " MD told me this was screwing me up" MAKES PATIENT HYPOTENSIVE   VITALS:  Blood pressure 125/60, pulse (!) 54,  temperature 98.1 F (36.7 C), temperature source Oral, resp. rate 18, height 5\' 1"  (1.549 m), weight 81.6 kg (180 lb), SpO2 96 %. PHYSICAL EXAMINATION:  Physical Exam  Constitutional: She is oriented to person, place, and time and well-developed, well-nourished, and in no distress.  HENT:  Head: Normocephalic and atraumatic.  Eyes: Conjunctivae and EOM are normal. Pupils are equal, round, and reactive to light.  Neck: Normal range of motion. Neck supple. No tracheal deviation present. No thyromegaly present.  Cardiovascular: Normal rate, regular rhythm and normal heart sounds.   Pulmonary/Chest: Effort normal. No respiratory distress. She has wheezes. She exhibits no tenderness.  Abdominal: Soft. Bowel sounds are normal. She exhibits no distension. There is no tenderness.  Musculoskeletal: Normal range of motion.  Neurological: She is alert and oriented to person, place, and time. No cranial nerve deficit.  Skin: Skin is warm and dry. No rash noted.  Psychiatric: Mood and affect normal.   LABORATORY PANEL:   CBC  Recent Labs Lab 12/28/15 0430  WBC 6.1  HGB 12.5  HCT 36.9  PLT 181   ------------------------------------------------------------------------------------------------------------------ Chemistries   Recent Labs Lab 12/28/15 0430  NA 140  K 4.1  CL 108  CO2 23  GLUCOSE 244*  BUN 10  CREATININE 0.89  CALCIUM 9.3   RADIOLOGY:  No results found. ASSESSMENT AND PLAN:  70 y.o. female with a known history of Rheumatoid arthritis, hypothyroidism, COPD, CAD and CHF. She is admitted with trouble breathing going on for few days  1. COPD exacerbation: continue DuoNeb & budesonide nebulizers, Zithromax and Solu-Medrol 60  mg every 12. 2. Hypokalemia: repleted and resolved 3. Type 2 diabetes mellitus. Sliding scale ordered. Continue glipizide, add novolog 4 units TID 4. History of seizure: continue Keppra 5. History of stroke: continue Aggrenox 6. Essential  hypertension continue usual medications 7. Hypothyroidism: continue levothyroxine 8. Diabetic neuropathy on gabapentin 9. Hyperlipidemia unspecified on Crestor 10. Tobacco abuse smoking cessation counseling done 3 minutes by Dr Leslye Peer. No need from nicotine patch at this point in time. Patient trying to quit. 11. Headache: prn Fioricet    All the records are reviewed and case discussed with Care Management/Social Worker. Management plans discussed with the patient, family and they are in agreement.  CODE STATUS: DNR  TOTAL TIME TAKING CARE OF THIS PATIENT: 35 minutes.   More than 50% of the time was spent in counseling/coordination of care: YES  POSSIBLE D/C IN 1-2 DAYS, DEPENDING ON CLINICAL CONDITION.   Max Sane M.D on 12/28/2015 at 12:23 PM  Between 7am to 6pm - Pager - [phone]  After 6pm go to [email or web address] - Proofreader  [facility]  Office  [phone]  CC: Primary care physician; Nobie Putnam, DO  Note: This dictation was prepared with Dragon dictation along with smaller phrase technology. Any transcriptional errors that result from this process are unintentional.

## 2015-12-28 NOTE — Progress Notes (Signed)
Inpatient Diabetes Program Recommendations  AACE/ADA: New Consensus Statement on Inpatient Glycemic Control (2015)  Target Ranges:  Prepandial:   less than 140 mg/dL      Peak postprandial:   less than 180 mg/dL (1-2 hours)      Critically ill patients:  140 - 180 mg/dL  Results for GENEIVEVE, DEUS (MRN [id number]) as of 12/28/2015 09:54  Ref. Range 12/27/2015 16:38 12/27/2015 20:59 12/28/2015 07:26  Glucose-Capillary Latest Ref Range: 65 - 99 mg/dL 288 (H) 238 (H) 215 (H)    Review of Glycemic Control  Diabetes history: DM2 Outpatient Diabetes medications: Metaglip 10-998 mg QAM, Metaglip 5-500 mg QPM Current orders for Inpatient glycemic control: Metformin 500 mg BID, Glipizide 5 mg BID, Novolog 0-9 units TID with meals, Novolog 0-5 units QHS  Inpatient Diabetes Program Recommendations: Insulin - Meal Coverage: If steroids are continued, please consider ordering Novolog 4 units TID with meals for meal coverage if patient eats at least 50% of meals.  Thanks, Barnie Alderman, RN, MSN, CDE Diabetes Coordinator Inpatient Diabetes Program [phone] (Team Pager from 8am to 5pm)

## 2015-12-28 NOTE — Care Management (Signed)
Met with patient at bedside. She lives at home with 6 family members in a small trailer. She declines home health due to lack of room. No home O2. PCP is Dr. Parks Ranger. No needs identified.  Case Closed

## 2015-12-29 DIAGNOSIS — E785 Hyperlipidemia, unspecified: Secondary | ICD-10-CM | POA: Diagnosis not present

## 2015-12-29 DIAGNOSIS — J441 Chronic obstructive pulmonary disease with (acute) exacerbation: Secondary | ICD-10-CM | POA: Diagnosis not present

## 2015-12-29 DIAGNOSIS — E876 Hypokalemia: Secondary | ICD-10-CM | POA: Diagnosis not present

## 2015-12-29 DIAGNOSIS — E1140 Type 2 diabetes mellitus with diabetic neuropathy, unspecified: Secondary | ICD-10-CM | POA: Diagnosis not present

## 2015-12-29 LAB — GLUCOSE, CAPILLARY
GLUCOSE-CAPILLARY: 216 mg/dL — AB (ref 65–99)
GLUCOSE-CAPILLARY: 214 mg/dL — AB (ref 65–99)
GLUCOSE-CAPILLARY: 163 mg/dL — AB (ref 65–99)
Glucose-Capillary: 258 mg/dL — ABNORMAL HIGH (ref 65–99)

## 2015-12-29 MED ORDER — GUAIFENESIN ER 600 MG PO TB12
600.0000 mg | ORAL_TABLET | Freq: Two times a day (BID) | ORAL | Status: DC
  Administered 2015-12-29 – 2016-01-01 (×7): 600 mg via ORAL
  Filled 2015-12-29 (×7): qty 1

## 2015-12-29 NOTE — Progress Notes (Signed)
[place] at [place] NAME: Samantha Aguirre    MRN#:  [id number]  DATE OF BIRTH:  09-08-1945  SUBJECTIVE:  Hospital Day: 0 days Samantha Aguirre is a 70 y.o. female presenting with Cough; Shortness of Breath; Back Pain; and Chest Pain .   Overnight events: no acute overnight events Interval Events: complaints of cough and congestion  REVIEW OF SYSTEMS:  CONSTITUTIONAL: No fever, fatigue or weakness.  EYES: No blurred or double vision.  EARS, NOSE, AND THROAT: No tinnitus or ear pain.  RESPIRATORY: positive cough, shortness of breath, denies wheezing or hemoptysis.  CARDIOVASCULAR: No chest pain, orthopnea, edema.  GASTROINTESTINAL: No nausea, vomiting, diarrhea or abdominal pain.  GENITOURINARY: No dysuria, hematuria.  ENDOCRINE: No polyuria, nocturia,  HEMATOLOGY: No anemia, easy bruising or bleeding SKIN: No rash or lesion. MUSCULOSKELETAL: No joint pain or arthritis.   NEUROLOGIC: No tingling, numbness, weakness.  PSYCHIATRY: No anxiety or depression.   DRUG ALLERGIES:   Allergies  Allergen Reactions  . Mushroom Extract Complex Anaphylaxis    Made patient "deathly sick"  . Atenolol     "MD took me off of it bc it was doing something wrong" Made patient HYPOTENSIVE  . Ivp Dye [Iodinated Diagnostic Agents] Itching    Pt injected with IV contrast.  5 min after injection pt complained of itching behind ear and on her abd.  . Morphine And Related Other (See Comments)    "stops my breathing" NEAR-RESPIRATORY ARREST  . Percocet [Oxycodone-Acetaminophen] Nausea And Vomiting and Other (See Comments)    Stomach pains  . Percodan [Oxycodone-Aspirin] Nausea And Vomiting and Other (See Comments)    Stomach pains  . Verapamil Other (See Comments)    " MD told me this was screwing me up" MAKES PATIENT HYPOTENSIVE    VITALS:  Blood pressure (!) 116/57, pulse (!) 45, temperature 97.6 F (36.4 C), temperature source Oral, resp.  rate 18, height 5\' 1"  (1.549 m), weight 81.6 kg (180 lb), SpO2 93 %.  PHYSICAL EXAMINATION:  VITAL SIGNS: Vitals:   12/29/15 0525 12/29/15 0800  BP: (!) 130/59 (!) 116/57  Pulse: (!) 51 (!) 45  Resp:  18  Temp: 97.5 F (36.4 C) 97.6 F (36.4 C)   GENERAL:70 y.o.female currently in no acute distress.  HEAD: Normocephalic, atraumatic.  EYES: Pupils equal, round, reactive to light. Extraocular muscles intact. No scleral icterus.  MOUTH: Moist mucosal membrane. Dentition intact. No abscess noted.  EAR, NOSE, THROAT: Clear without exudates. No external lesions.  NECK: Supple. No thyromegaly. No nodules. No JVD.  PULMONARY: expiratory wheeze with scattered rhonci. No use of accessory muscles, Good respiratory effort. good air entry bilaterally CHEST: Nontender to palpation.  CARDIOVASCULAR: S1 and S2. Regular rate and rhythm. No murmurs, rubs, or gallops. No edema. Pedal pulses 2+ bilaterally.  GASTROINTESTINAL: Soft, nontender, nondistended. No masses. Positive bowel sounds. No hepatosplenomegaly.  MUSCULOSKELETAL: No swelling, clubbing, or edema. Range of motion full in all extremities.  NEUROLOGIC: Cranial nerves II through XII are intact. No gross focal neurological deficits. Sensation intact. Reflexes intact.  SKIN: No ulceration, lesions, rashes, or cyanosis. Skin warm and dry. Turgor intact.  PSYCHIATRIC: Mood, affect within normal limits. The patient is awake, alert and oriented x 3. Insight, judgment intact.      LABORATORY PANEL:   CBC  Recent Labs Lab 12/28/15 0430  WBC 6.1  HGB 12.5  HCT 36.9  PLT 181   ------------------------------------------------------------------------------------------------------------------  Chemistries   Recent Labs Lab  12/28/15 0430  NA 140  K 4.1  CL 108  CO2 23  GLUCOSE 244*  BUN 10  CREATININE 0.89  CALCIUM 9.3    ------------------------------------------------------------------------------------------------------------------  Cardiac Enzymes  Recent Labs Lab 12/27/15 0931  TROPONINI <0.03   ------------------------------------------------------------------------------------------------------------------  RADIOLOGY:  No results found.  EKG:   Orders placed or performed during the hospital encounter of 12/27/15  . EKG 12-Lead  . EKG 12-Lead  . EKG 12-Lead  . EKG 12-Lead  . ED EKG within 10 minutes  . ED EKG within 10 minutes    ASSESSMENT AND PLAN:   Samantha Aguirre is a 70 y.o. female presenting with Cough; Shortness of Breath; Back Pain; and Chest Pain . Admitted 12/27/2015 : Day #: 0 days  1. Copd exacerbation: steroids, nebs, add mucinex 2. Hypokalemia: resolved 3. Type 2 diabetes continue current 4. Hypothyroidism unspecified: synthroid   All the records are reviewed and case discussed with Care Management/Social Workerr. Management plans discussed with the patient, family and they are in agreement.  CODE STATUS: full TOTAL TIME TAKING CARE OF THIS PATIENT: 33 minutes.   POSSIBLE D/C IN 1DAYS, DEPENDING ON CLINICAL CONDITION.   Samantha Aguirre,  Karenann Cai.D on 12/29/2015 at 12:35 PM  Between 7am to 6pm - Pager - [phone]  After 6pm: House Pager: - [phone]  [facility]  Office  [phone]  CC: Primary care physician; Nobie Putnam, DO

## 2015-12-29 NOTE — Care Management Obs Status (Signed)
[facility] NOTIFICATION   Patient Details  Name: Samantha Aguirre MRN: [id number] Date of Birth: 1945-11-12   Medicare Observation Status Notification Given:  Yes (COPD)    Ival Bible, RN 12/29/2015, 11:19 AM

## 2015-12-30 DIAGNOSIS — J441 Chronic obstructive pulmonary disease with (acute) exacerbation: Secondary | ICD-10-CM | POA: Diagnosis not present

## 2015-12-30 DIAGNOSIS — E1140 Type 2 diabetes mellitus with diabetic neuropathy, unspecified: Secondary | ICD-10-CM | POA: Diagnosis not present

## 2015-12-30 DIAGNOSIS — E876 Hypokalemia: Secondary | ICD-10-CM | POA: Diagnosis not present

## 2015-12-30 DIAGNOSIS — E785 Hyperlipidemia, unspecified: Secondary | ICD-10-CM | POA: Diagnosis not present

## 2015-12-30 LAB — GLUCOSE, CAPILLARY
GLUCOSE-CAPILLARY: 112 mg/dL — AB (ref 65–99)
Glucose-Capillary: 176 mg/dL — ABNORMAL HIGH (ref 65–99)
Glucose-Capillary: 141 mg/dL — ABNORMAL HIGH (ref 65–99)
Glucose-Capillary: 196 mg/dL — ABNORMAL HIGH (ref 65–99)

## 2015-12-30 MED ORDER — FLUTICASONE PROPIONATE 50 MCG/ACT NA SUSP
1.0000 | Freq: Every day | NASAL | Status: DC
  Administered 2015-12-30 – 2015-12-31 (×2): 1 via NASAL
  Filled 2015-12-30: qty 16

## 2015-12-30 MED ORDER — IPRATROPIUM-ALBUTEROL 0.5-2.5 (3) MG/3ML IN SOLN
3.0000 mL | RESPIRATORY_TRACT | Status: DC
  Administered 2015-12-30 – 2015-12-31 (×6): 3 mL via RESPIRATORY_TRACT
  Filled 2015-12-30 (×5): qty 3

## 2015-12-30 NOTE — Progress Notes (Signed)
Patient ambulated 3/4 around nursing station with POX 91-92%, complaints of lightheadedness which concluded ambulation. Will continue to monitor.

## 2015-12-30 NOTE — Progress Notes (Signed)
[place] at [place] NAME: Samantha Aguirre    MRN#:  [id number]  DATE OF BIRTH:  [birth year]  SUBJECTIVE:  Hospital Day: 0 days Samantha Aguirre is a 70 y.o. female presenting with Cough; Shortness of Breath; Back Pain; and Chest Pain .   Overnight events: no acute overnight events Interval Events: Still complains of wheezing, shortness of breath, congestion  REVIEW OF SYSTEMS:  CONSTITUTIONAL: No fever, fatigue or weakness.  EYES: No blurred or double vision.  EARS, NOSE, AND THROAT: No tinnitus or ear pain.  RESPIRATORY: positive cough, shortness of breath, Positive wheezing denies hemoptysis.  CARDIOVASCULAR: No chest pain, orthopnea, edema.  GASTROINTESTINAL: No nausea, vomiting, diarrhea or abdominal pain.  GENITOURINARY: No dysuria, hematuria.  ENDOCRINE: No polyuria, nocturia,  HEMATOLOGY: No anemia, easy bruising or bleeding SKIN: No rash or lesion. MUSCULOSKELETAL: No joint pain or arthritis.   NEUROLOGIC: No tingling, numbness, weakness.  PSYCHIATRY: No anxiety or depression.   DRUG ALLERGIES:   Allergies  Allergen Reactions  . Mushroom Extract Complex Anaphylaxis    Made patient "deathly sick"  . Atenolol     "MD took me off of it bc it was doing something wrong" Made patient HYPOTENSIVE  . Ivp Dye [Iodinated Diagnostic Agents] Itching    Pt injected with IV contrast.  5 min after injection pt complained of itching behind ear and on her abd.  . Morphine And Related Other (See Comments)    "stops my breathing" NEAR-RESPIRATORY ARREST  . Percocet [Oxycodone-Acetaminophen] Nausea And Vomiting and Other (See Comments)    Stomach pains  . Percodan [Oxycodone-Aspirin] Nausea And Vomiting and Other (See Comments)    Stomach pains  . Verapamil Other (See Comments)    " MD told me this was screwing me up" MAKES PATIENT HYPOTENSIVE    VITALS:  Blood pressure 124/64, pulse 72, temperature 98 F (36.7 C),  temperature source Oral, resp. rate 18, height 5\' 1"  (1.549 m), weight 81.6 kg (180 lb), SpO2 91 %.  PHYSICAL EXAMINATION:  VITAL SIGNS: Vitals:   12/30/15 0720 12/30/15 1042  BP: 124/64   Pulse: (!) 51 72  Resp: 18   Temp: 98 F (36.7 C)    GENERAL:70 y.o.female currently in no acute distress.  HEAD: Normocephalic, atraumatic.  EYES: Pupils equal, round, reactive to light. Extraocular muscles intact. No scleral icterus.  MOUTH: Moist mucosal membrane. Dentition intact. No abscess noted.  EAR, NOSE, THROAT: Clear without exudates. No external lesions.  NECK: Supple. No thyromegaly. No nodules. No JVD.  PULMONARY: expiratory wheeze with scattered rhonci. No use of accessory muscles, Good respiratory effort. good air entry bilaterally CHEST: Nontender to palpation.  CARDIOVASCULAR: S1 and S2. Regular rate and rhythm. No murmurs, rubs, or gallops. No edema. Pedal pulses 2+ bilaterally.  GASTROINTESTINAL: Soft, nontender, nondistended. No masses. Positive bowel sounds. No hepatosplenomegaly.  MUSCULOSKELETAL: No swelling, clubbing, or edema. Range of motion full in all extremities.  NEUROLOGIC: Cranial nerves II through XII are intact. No gross focal neurological deficits. Sensation intact. Reflexes intact.  SKIN: No ulceration, lesions, rashes, or cyanosis. Skin warm and dry. Turgor intact.  PSYCHIATRIC: Mood, affect within normal limits. The patient is awake, alert and oriented x 3. Insight, judgment intact.      LABORATORY PANEL:   CBC  Recent Labs Lab 12/28/15 0430  WBC 6.1  HGB 12.5  HCT 36.9  PLT 181   ------------------------------------------------------------------------------------------------------------------  Chemistries   Recent Labs Lab 12/28/15 0430  NA 140  K 4.1  CL 108  CO2 23  GLUCOSE 244*  BUN 10  CREATININE 0.89  CALCIUM 9.3    ------------------------------------------------------------------------------------------------------------------  Cardiac Enzymes  Recent Labs Lab 12/27/15 0931  TROPONINI <0.03   ------------------------------------------------------------------------------------------------------------------  RADIOLOGY:  No results found.  EKG:   Orders placed or performed during the hospital encounter of 12/27/15  . EKG 12-Lead  . EKG 12-Lead  . EKG 12-Lead  . EKG 12-Lead  . ED EKG within 10 minutes  . ED EKG within 10 minutes    ASSESSMENT AND PLAN:   Samantha Aguirre is a 70 y.o. female presenting with Cough; Shortness of Breath; Back Pain; and Chest Pain . Admitted 12/27/2015 : Day #: 0 days  1. Copd exacerbation: steroids, nebs, mucinex, ambulate patient's day and anticipate discharge tomorrow 2. Hypokalemia: resolved 3. Type 2 diabetes continue current 4. Hypothyroidism unspecified: synthroid  Home health, discharge tomorrow  All the records are reviewed and case discussed with Care Management/Social Workerr. Management plans discussed with the patient, family and they are in agreement.  CODE STATUS: full TOTAL TIME TAKING CARE OF THIS PATIENT: 28 minutes.   POSSIBLE D/C IN 1DAYS, DEPENDING ON CLINICAL CONDITION.   Samantha Aguirre,  Karenann Cai.D on 12/30/2015 at 12:09 PM  Between 7am to 6pm - Pager - [phone]  After 6pm: House Pager: - [phone]  [facility]  Office  [phone]  CC: Primary care physician; Nobie Putnam, DO

## 2015-12-31 ENCOUNTER — Observation Stay: Payer: Medicare Other

## 2015-12-31 DIAGNOSIS — J441 Chronic obstructive pulmonary disease with (acute) exacerbation: Secondary | ICD-10-CM | POA: Diagnosis not present

## 2015-12-31 DIAGNOSIS — E785 Hyperlipidemia, unspecified: Secondary | ICD-10-CM | POA: Diagnosis not present

## 2015-12-31 DIAGNOSIS — E1140 Type 2 diabetes mellitus with diabetic neuropathy, unspecified: Secondary | ICD-10-CM | POA: Diagnosis not present

## 2015-12-31 DIAGNOSIS — E876 Hypokalemia: Secondary | ICD-10-CM | POA: Diagnosis not present

## 2015-12-31 DIAGNOSIS — R0602 Shortness of breath: Secondary | ICD-10-CM | POA: Diagnosis not present

## 2015-12-31 DIAGNOSIS — R05 Cough: Secondary | ICD-10-CM | POA: Diagnosis not present

## 2015-12-31 LAB — CBC
HEMATOCRIT: 35.9 % (ref 35.0–47.0)
Hemoglobin: 12.3 g/dL (ref 12.0–16.0)
MCH: 29.4 pg (ref 26.0–34.0)
MCHC: 34.4 g/dL (ref 32.0–36.0)
MCV: 85.5 fL (ref 80.0–100.0)
PLATELETS: 201 10*3/uL (ref 150–440)
RBC: 4.19 MIL/uL (ref 3.80–5.20)
RDW: 17.4 % — AB (ref 11.5–14.5)
WBC: 9.7 10*3/uL (ref 3.6–11.0)

## 2015-12-31 LAB — GLUCOSE, CAPILLARY
GLUCOSE-CAPILLARY: 207 mg/dL — AB (ref 65–99)
GLUCOSE-CAPILLARY: 99 mg/dL (ref 65–99)
Glucose-Capillary: 180 mg/dL — ABNORMAL HIGH (ref 65–99)
Glucose-Capillary: 190 mg/dL — ABNORMAL HIGH (ref 65–99)

## 2015-12-31 LAB — CREATININE, SERUM
Creatinine, Ser: 0.86 mg/dL (ref 0.44–1.00)
GFR calc Af Amer: >60 mL/min (ref >60)
GFR calc non Af Amer: >60 mL/min (ref >60)

## 2015-12-31 MED ORDER — IPRATROPIUM-ALBUTEROL 0.5-2.5 (3) MG/3ML IN SOLN
3.0000 mL | RESPIRATORY_TRACT | Status: DC
  Administered 2015-12-31 – 2016-01-01 (×7): 3 mL via RESPIRATORY_TRACT
  Filled 2015-12-31 (×7): qty 3

## 2015-12-31 MED ORDER — IPRATROPIUM-ALBUTEROL 0.5-2.5 (3) MG/3ML IN SOLN
3.0000 mL | Freq: Four times a day (QID) | RESPIRATORY_TRACT | Status: DC
  Administered 2015-12-31: 3 mL via RESPIRATORY_TRACT
  Filled 2015-12-31: qty 3

## 2015-12-31 NOTE — Progress Notes (Signed)
[facility] at [place] NAME: Samantha Aguirre    MRN#:  [id number]  DATE OF BIRTH:  1945-02-04  SUBJECTIVE:  Hospital Day: 0 days Samantha Aguirre is a 70 y.o. female presenting with Cough; Shortness of Breath; Back Pain; and Chest Pain .   Overnight events: no acute overnight events Interval Events: Still complains of wheezing, shortness of breath, congestion  REVIEW OF SYSTEMS:  CONSTITUTIONAL: No fever, fatigue or weakness.  EYES: No blurred or double vision.  EARS, NOSE, AND THROAT: No tinnitus or ear pain.  RESPIRATORY: positive cough, shortness of breath, Positive wheezing denies hemoptysis.  CARDIOVASCULAR: No chest pain, orthopnea, edema.  GASTROINTESTINAL: No nausea, vomiting, diarrhea or abdominal pain.  GENITOURINARY: No dysuria, hematuria.  ENDOCRINE: No polyuria, nocturia,  HEMATOLOGY: No anemia, easy bruising or bleeding SKIN: No rash or lesion. MUSCULOSKELETAL: No joint pain or arthritis.   NEUROLOGIC: No tingling, numbness, weakness.  PSYCHIATRY: No anxiety or depression.   DRUG ALLERGIES:   Allergies  Allergen Reactions  . Mushroom Extract Complex Anaphylaxis    Made patient "deathly sick"  . Atenolol     "MD took me off of it bc it was doing something wrong" Made patient HYPOTENSIVE  . Ivp Dye [Iodinated Diagnostic Agents] Itching    Pt injected with IV contrast.  5 min after injection pt complained of itching behind ear and on her abd.  . Morphine And Related Other (See Comments)    "stops my breathing" NEAR-RESPIRATORY ARREST  . Percocet [Oxycodone-Acetaminophen] Nausea And Vomiting and Other (See Comments)    Stomach pains  . Percodan [Oxycodone-Aspirin] Nausea And Vomiting and Other (See Comments)    Stomach pains  . Verapamil Other (See Comments)    " MD told me this was screwing me up" MAKES PATIENT HYPOTENSIVE    VITALS:  Blood pressure (!) 155/68, pulse (!) 51, temperature 97.2 F (36.2  C), temperature source Axillary, resp. rate 18, height 5\' 1"  (1.549 m), weight 81.6 kg (180 lb), SpO2 93 %.  PHYSICAL EXAMINATION:  VITAL SIGNS: Vitals:   12/31/15 0439 12/31/15 0734  BP: (!) 143/68 (!) 155/68  Pulse: (!) 53 (!) 51  Resp: 18 18  Temp: 97.9 F (36.6 C) 97.2 F (36.2 C)   GENERAL:70 y.o.female currently in no acute distress.  HEAD: Normocephalic, atraumatic.  EYES: Pupils equal, round, reactive to light. Extraocular muscles intact. No scleral icterus.  MOUTH: Moist mucosal membrane. Dentition intact. No abscess noted.  EAR, NOSE, THROAT: Clear without exudates. No external lesions.  NECK: Supple. No thyromegaly. No nodules. No JVD.  PULMONARY: Diffuse coarse rhonchi scant expiratory wheezing No use of accessory muscles, Good respiratory effort. good air entry bilaterally CHEST: Nontender to palpation.  CARDIOVASCULAR: S1 and S2. Regular rate and rhythm. No murmurs, rubs, or gallops. No edema. Pedal pulses 2+ bilaterally.  GASTROINTESTINAL: Soft, nontender, nondistended. No masses. Positive bowel sounds. No hepatosplenomegaly.  MUSCULOSKELETAL: No swelling, clubbing, or edema. Range of motion full in all extremities.  NEUROLOGIC: Cranial nerves II through XII are intact. No gross focal neurological deficits. Sensation intact. Reflexes intact.  SKIN: No ulceration, lesions, rashes, or cyanosis. Skin warm and dry. Turgor intact.  PSYCHIATRIC: Mood, affect within normal limits. The patient is awake, alert and oriented x 3. Insight, judgment intact.      LABORATORY PANEL:   CBC  Recent Labs Lab 12/31/15 0346  WBC 9.7  HGB 12.3  HCT 35.9  PLT 201   ------------------------------------------------------------------------------------------------------------------  Chemistries  Recent Labs Lab 12/28/15 0430 12/31/15 0346  NA 140  --   K 4.1  --   CL 108  --   CO2 23  --   GLUCOSE 244*  --   BUN 10  --   CREATININE 0.89 0.86  CALCIUM 9.3  --     ------------------------------------------------------------------------------------------------------------------  Cardiac Enzymes  Recent Labs Lab 12/27/15 0931  TROPONINI <0.03   ------------------------------------------------------------------------------------------------------------------  RADIOLOGY:  Dg Chest Port 1 View  Result Date: 12/31/2015 CLINICAL DATA:  Cough.  Shortness of breath . EXAM: PORTABLE CHEST 1 VIEW COMPARISON:  12/27/2015 . FINDINGS: Mediastinum hilar structures normal. A calcified stable nodule left upper lobe consistent tiny granuloma. Mild right base infiltrate cannot be excluded Cardiomegaly with normal pulmonary vascularity. No pleural effusion or pneumothorax IMPRESSION: 1.  Mild right base infiltrate cannot be excluded. 2. Stable cardiomegaly. Electronically Signed   By: Marcello Moores  Register   On: 12/31/2015 09:30    EKG:   Orders placed or performed during the hospital encounter of 12/27/15  . EKG 12-Lead  . EKG 12-Lead  . EKG 12-Lead  . EKG 12-Lead  . ED EKG within 10 minutes  . ED EKG within 10 minutes    ASSESSMENT AND PLAN:   Samantha Aguirre is a 70 y.o. female presenting with Cough; Shortness of Breath; Back Pain; and Chest Pain . Admitted 12/27/2015 : Day #: 0 days  1. Copd exacerbation: steroids, nebs, mucinex, Check chest x-ray given worsening or rhonchi 2. Hypokalemia: resolved 3. Type 2 diabetes continue current 4. Hypothyroidism unspecified: synthroid  Home health  All the records are reviewed and case discussed with Care Management/Social Workerr. Management plans discussed with the patient, family and they are in agreement.  CODE STATUS: full TOTAL TIME TAKING CARE OF THIS PATIENT: 33 minutes.   POSSIBLE D/C IN 1DAYS, DEPENDING ON CLINICAL CONDITION.   Hower,  Karenann Cai.D on 12/31/2015 at 2:14 PM  Between 7am to 6pm - Pager - [phone]  After 6pm: House Pager: - [phone]  [facility]   Office  [phone]  CC: Primary care physician; Nobie Putnam, DO

## 2015-12-31 NOTE — Progress Notes (Signed)
Physical Therapy Treatment Patient Details Name: Samantha Aguirre MRN: [id number] DOB: [birth year] Today's Date: 12/31/2015    History of Present Illness presented to ER secondary to cough, SOB, fever and chills; admitted with acute COPD exacerbation.    PT Comments    Patient moving with greater ease and fluidity compared to initial evaluation.  Maintaining sats >93% on RA throughout session; intermittent rest breaks (initiated by therapist) throughout session as needed.    Follow Up Recommendations  Home health PT     Equipment Recommendations       Recommendations for Other Services       Precautions / Restrictions Precautions Precautions: Fall Precaution Comments: seizure history (last seizure approx 2 days prior per patient; able to tell when seizure coming on) Restrictions Weight Bearing Restrictions: No    Mobility  Bed Mobility                  Transfers Overall transfer level: Needs assistance Equipment used: Rolling walker (2 wheeled) Transfers: Sit to/from Stand Sit to Stand: Supervision         General transfer comment: able to complete with good stability this date; no episodes of LE "dancing" this date  Ambulation/Gait Ambulation/Gait assistance: Min guard Ambulation Distance (Feet): 220 Feet Assistive device: Rolling walker (2 wheeled)       General Gait Details: able to complete distance without reports of dizziness this date; single standing rest period throughout distance.  Decreased cadence/gait speed persists, but no buckling or LOB.  Continue to recommend use of rW fo roptimal stability and overall energy conservation.   Stairs            Wheelchair Mobility    Modified Rankin (Stroke Patients Only)       Balance Overall balance assessment: Needs assistance Sitting-balance support: No upper extremity supported;Feet supported Sitting balance-Leahy Scale: Good     Standing balance support: Bilateral upper extremity  supported Standing balance-Leahy Scale: Fair                      Cognition Arousal/Alertness: Awake/alert Behavior During Therapy: WFL for tasks assessed/performed Overall Cognitive Status: Within Functional Limits for tasks assessed                      Exercises Other Exercises Other Exercises: Toilet transfer, ambulatory with RW, close sup; sit/stand from standard toilet height with grab bar, close sup.  Good LE power with movement transition. Other Exercises: Seated UE/LE therex, 1x12, AROM: ankle pumps, LAQs with hip abduct/adduct (t-exercise), alternate UE/LE flexion    General Comments        Pertinent Vitals/Pain Pain Assessment: No/denies pain    Home Living                      Prior Function            PT Goals (current goals can now be found in the care plan section) Acute Rehab PT Goals Patient Stated Goal: to return home when I'm ready PT Goal Formulation: With patient Time For Goal Achievement: 01/11/16 Potential to Achieve Goals: Good Progress towards PT goals: Progressing toward goals    Frequency    Min 2X/week      PT Plan Current plan remains appropriate    Co-evaluation             End of Session Equipment Utilized During Treatment: Gait belt Activity Tolerance: Patient tolerated treatment well Patient left:  in chair;with call bell/phone within reach;with chair alarm set     Time: [phone] PT Time Calculation (min) (ACUTE ONLY): 18 min  Charges:  $Gait Training: 8-22 mins                    G Codes:      [name], PT, DPT, NCS 12/31/15, 11:03 PM [phone]

## 2016-01-01 DIAGNOSIS — E785 Hyperlipidemia, unspecified: Secondary | ICD-10-CM | POA: Diagnosis not present

## 2016-01-01 DIAGNOSIS — J441 Chronic obstructive pulmonary disease with (acute) exacerbation: Secondary | ICD-10-CM | POA: Diagnosis not present

## 2016-01-01 DIAGNOSIS — E876 Hypokalemia: Secondary | ICD-10-CM | POA: Diagnosis not present

## 2016-01-01 DIAGNOSIS — E1140 Type 2 diabetes mellitus with diabetic neuropathy, unspecified: Secondary | ICD-10-CM | POA: Diagnosis not present

## 2016-01-01 LAB — GLUCOSE, CAPILLARY
Glucose-Capillary: 164 mg/dL — ABNORMAL HIGH (ref 65–99)
Glucose-Capillary: 162 mg/dL — ABNORMAL HIGH (ref 65–99)

## 2016-01-01 MED ORDER — ALBUTEROL SULFATE HFA 108 (90 BASE) MCG/ACT IN AERS: 2.0000 | INHALATION_SPRAY | Freq: Four times a day (QID) | RESPIRATORY_TRACT | 2 refills | Status: DC | PRN

## 2016-01-01 MED ORDER — FLUTICASONE PROPIONATE 50 MCG/ACT NA SUSP: 1.0000 | Freq: Every day | NASAL | 2 refills | Status: DC

## 2016-01-01 MED ORDER — GUAIFENESIN ER 600 MG PO TB12: 600.0000 mg | ORAL_TABLET | Freq: Two times a day (BID) | ORAL | 0 refills | Status: DC | PRN

## 2016-01-01 MED ORDER — PREDNISONE 10 MG PO TABS: ORAL_TABLET | ORAL | 0 refills | Status: DC

## 2016-01-01 NOTE — Progress Notes (Signed)
Pt being discharged home today. PIV removed. Discharge instructions reviewed with pt, all questions were answered. Prescriptions faxed to pharmacy for her to pick up. Her follow up appointment has been scheduled for next week. She is leaving with all of her belongings, will be transported home via daughter.

## 2016-01-01 NOTE — Discharge Summary (Signed)
[facility] [place] NAME: Samantha Aguirre    MR#:  [id number]  DATE OF BIRTH:  1946-01-05  DATE OF ADMISSION:  12/27/2015 ADMITTING PHYSICIAN: Vaughan Basta, MD  DATE OF DISCHARGE: 01/01/16  PRIMARY CARE PHYSICIAN: Nobie Putnam, DO    ADMISSION DIAGNOSIS:  SOB (shortness of breath) [R06.02] COPD exacerbation (HCC) [J44.1]  DISCHARGE DIAGNOSIS:  Active Problems:   COPD exacerbation ([place])   SECONDARY DIAGNOSIS:   Past Medical History:  Diagnosis Date  . Acid reflux   . Arthritis    Bilateral knees, hands, feet  . Asthma   . Cancer ([place])   . CHF (congestive heart failure) (HCC)    Diastolic CHF  . COPD (chronic obstructive pulmonary disease) ([place])   . Coronary artery disease   . Diabetes mellitus without complication ([place])   . Frequent headaches   . Heart attack   . High cholesterol   . HLD (hyperlipidemia)   . Hypertension   . MI (myocardial infarction)   . Seizures ([place])   . Skin cancer 2015   Suspected basal cell carcinoma on nose, surgically removed  . Stroke ([place])   . Thyroid disease   . Tremors of nervous system     HOSPITAL COURSE:  Samantha Aguirre  is a 70 y.o. female admitted 12/27/2015 with chief complaint Cough; Shortness of Breath; Back Pain; and Chest Pain . Please see H&P performed by Vaughan Basta, MD for further information. Patient presented with the above symptoms. Found to have COPD exacerbation. Treated with breathing treatments, steroids with improvement. She was evaluated by PT recommended home health, but patient declined   DISCHARGE CONDITIONS:   stable  CONSULTS OBTAINED:    DRUG ALLERGIES:   Allergies  Allergen Reactions  . Mushroom Extract Complex Anaphylaxis    Made patient "deathly sick"  . Atenolol     "MD took me off of it bc it was doing something wrong" Made patient HYPOTENSIVE  . Ivp Dye [Iodinated Diagnostic Agents] Itching    Pt injected  with IV contrast.  5 min after injection pt complained of itching behind ear and on her abd.  . Morphine And Related Other (See Comments)    "stops my breathing" NEAR-RESPIRATORY ARREST  . Percocet [Oxycodone-Acetaminophen] Nausea And Vomiting and Other (See Comments)    Stomach pains  . Percodan [Oxycodone-Aspirin] Nausea And Vomiting and Other (See Comments)    Stomach pains  . Verapamil Other (See Comments)    " MD told me this was screwing me up" MAKES PATIENT HYPOTENSIVE    DISCHARGE MEDICATIONS:   Current Discharge Medication List    START taking these medications   Details  fluticasone (FLONASE) 50 MCG/ACT nasal spray Place 1 spray into both nostrils daily. Qty: 16 g, Refills: 2    guaiFENesin (MUCINEX) 600 MG 12 hr tablet Take 1 tablet (600 mg total) by mouth 2 (two) times daily as needed for cough or to loosen phlegm. Qty: 30 tablet, Refills: 0    predniSONE (DELTASONE) 10 MG tablet 40mg  x1 day, 20mg  x2 day, 10mg  x2 day then stop Qty: 10 tablet, Refills: 0      CONTINUE these medications which have CHANGED   Details  albuterol (PROVENTIL HFA;VENTOLIN HFA) 108 (90 Base) MCG/ACT inhaler Inhale 2 puffs into the lungs every 6 (six) hours as needed for wheezing or shortness of breath. Qty: 1 Inhaler, Refills: 2      CONTINUE these medications which have NOT CHANGED  Details  acetaminophen (TYLENOL) 325 MG tablet Take 650 mg by mouth at bedtime.    baclofen (LIORESAL) 10 MG tablet Take 0.5-1 tablets (5-10 mg total) by mouth 3 (three) times daily as needed for muscle spasms. Qty: 30 each, Refills: 1   Associated Diagnoses: Left medial knee pain; Primary osteoarthritis involving multiple joints    dipyridamole-aspirin (AGGRENOX) 200-25 MG 12hr capsule Take 1 capsule by mouth 2 (two) times daily. Qty: 60 capsule, Refills: 0    DULoxetine (CYMBALTA) 60 MG capsule Take 1 capsule (60 mg total) by mouth at bedtime. Qty: 30 capsule, Refills: 11   Associated Diagnoses:  Chronic bilateral low back pain without sciatica    fluticasone furoate-vilanterol (BREO ELLIPTA) 100-25 MCG/INH AEPB Inhale 1 puff into the lungs daily. Qty: 1 each, Refills: 11   Associated Diagnoses: Simple chronic bronchitis (HCC)    gabapentin (NEURONTIN) 800 MG tablet Take 1 tablet (800 mg total) by mouth 2 (two) times daily. Qty: 60 tablet, Refills: 11   Associated Diagnoses: Controlled type 2 diabetes mellitus with diabetic neuropathy, without long-term current use of insulin (HCC)    glipiZIDE-metformin (METAGLIP) 5-500 MG tablet Take 1-2 tablets by mouth See admin instructions. 2 tablets in the morning and 1 tablet at night Qty: 90 tablet, Refills: 5   Associated Diagnoses: Controlled type 2 diabetes mellitus with diabetic neuropathy, without long-term current use of insulin (HCC)    levETIRAcetam (KEPPRA) 500 MG tablet Take 1 tablet (500 mg total) by mouth 2 (two) times daily. Qty: 60 tablet, Refills: 0    levothyroxine (SYNTHROID, LEVOTHROID) 125 MCG tablet Take 1 tablet (125 mcg total) by mouth every other day. Qty: 30 tablet, Refills: 5   Associated Diagnoses: Other specified hypothyroidism    lisinopril (PRINIVIL,ZESTRIL) 20 MG tablet Take 1 tablet (20 mg total) by mouth daily. Qty: 30 tablet, Refills: 11   Associated Diagnoses: Essential hypertension; Controlled type 2 diabetes mellitus with diabetic neuropathy, without long-term current use of insulin (HCC)    rosuvastatin (CRESTOR) 40 MG tablet Take 1 tablet (40 mg total) by mouth daily. Qty: 30 tablet, Refills: 11   Associated Diagnoses: Controlled type 2 diabetes mellitus with diabetic neuropathy, without long-term current use of insulin ([place]); Cerebral infarction, unspecified mechanism ([place]); Mixed hyperlipidemia    umeclidinium bromide (INCRUSE ELLIPTA) 62.5 MCG/INH AEPB Inhale 1 puff into the lungs daily. Qty: 1 each, Refills: 11   Associated Diagnoses: Simple chronic bronchitis (HCC)         DISCHARGE  INSTRUCTIONS:    DIET:  Regular diet and Diabetic diet  DISCHARGE CONDITION:  Stable  ACTIVITY:  Activity as tolerated  OXYGEN:  Home Oxygen: No.   Oxygen Delivery: room air  DISCHARGE LOCATION:  home   If you experience worsening of your admission symptoms, develop shortness of breath, life threatening emergency, suicidal or homicidal thoughts you must seek medical attention immediately by calling 911 or calling your MD immediately  if symptoms less severe.  You Must read complete instructions/literature along with all the possible adverse reactions/side effects for all the Medicines you take and that have been prescribed to you. Take any new Medicines after you have completely understood and accpet all the possible adverse reactions/side effects.   Please note  You were cared for by a hospitalist during your hospital stay. If you have any questions about your discharge medications or the care you received while you were in the hospital after you are discharged, you can call the unit and asked to speak with  the hospitalist on call if the hospitalist that took care of you is not available. Once you are discharged, your primary care physician will handle any further medical issues. Please note that NO REFILLS for any discharge medications will be authorized once you are discharged, as it is imperative that you return to your primary care physician (or establish a relationship with a primary care physician if you do not have one) for your aftercare needs so that they can reassess your need for medications and monitor your lab values.    On the day of Discharge:   VITAL SIGNS:  Blood pressure (!) 145/64, pulse (!) 55, temperature 98 F (36.7 C), temperature source Oral, resp. rate 16, height 5\' 1"  (1.549 m), weight 81.6 kg (180 lb), SpO2 95 %.  I/O:   Intake/Output Summary (Last 24 hours) at 01/01/16 1051 Last data filed at 01/01/16 0900  Gross per 24 hour  Intake               360 ml  Output                0 ml  Net              360 ml    PHYSICAL EXAMINATION:  GENERAL:  70 y.o.-year-old patient lying in the bed with no acute distress.  EYES: Pupils equal, round, reactive to light and accommodation. No scleral icterus. Extraocular muscles intact.  HEENT: Head atraumatic, normocephalic. Oropharynx and nasopharynx clear.  NECK:  Supple, no jugular venous distention. No thyroid enlargement, no tenderness.  LUNGS: scant basalir wheezing, rales,rhonchi or crepitation. No use of accessory muscles of respiration.  CARDIOVASCULAR: S1, S2 normal. No murmurs, rubs, or gallops.  ABDOMEN: Soft, non-tender, non-distended. Bowel sounds present. No organomegaly or mass.  EXTREMITIES: No pedal edema, cyanosis, or clubbing.  NEUROLOGIC: Cranial nerves II through XII are intact. Muscle strength 5/5 in all extremities. Sensation intact. Gait not checked.  PSYCHIATRIC: The patient is alert and oriented x 3.  SKIN: No obvious rash, lesion, or ulcer.   DATA REVIEW:   CBC  Recent Labs Lab 12/31/15 0346  WBC 9.7  HGB 12.3  HCT 35.9  PLT 201    Chemistries   Recent Labs Lab 12/28/15 0430 12/31/15 0346  NA 140  --   K 4.1  --   CL 108  --   CO2 23  --   GLUCOSE 244*  --   BUN 10  --   CREATININE 0.89 0.86  CALCIUM 9.3  --     Cardiac Enzymes  Recent Labs Lab 12/27/15 0931  TROPONINI <0.03    Microbiology Results  Results for orders placed or performed during the hospital encounter of 08/16/15  Urine culture     Status: Abnormal   Collection Time: 08/16/15  3:17 PM  Result Value Ref Range Status   Specimen Description URINE, RANDOM  Final   Special Requests NONE  Final   Culture MULTIPLE SPECIES PRESENT, SUGGEST RECOLLECTION (A)  Final   Report Status 08/17/2015 FINAL  Final    RADIOLOGY:  Dg Chest Port 1 View  Result Date: 12/31/2015 CLINICAL DATA:  Cough.  Shortness of breath . EXAM: PORTABLE CHEST 1 VIEW COMPARISON:  12/27/2015 . FINDINGS:  Mediastinum hilar structures normal. A calcified stable nodule left upper lobe consistent tiny granuloma. Mild right base infiltrate cannot be excluded Cardiomegaly with normal pulmonary vascularity. No pleural effusion or pneumothorax IMPRESSION: 1.  Mild right base infiltrate cannot be excluded. 2.  Stable cardiomegaly. Electronically Signed   By: Marcello Moores  Register   On: 12/31/2015 09:30     Management plans discussed with the patient, family and they are in agreement.  CODE STATUS:     Code Status Orders        Start     Ordered   12/27/15 1309  Do not attempt resuscitation (DNR)  Continuous    Question Answer Comment  In the event of cardiac or respiratory ARREST Do not call a "code blue"   In the event of cardiac or respiratory ARREST Do not perform Intubation, CPR, defibrillation or ACLS   In the event of cardiac or respiratory ARREST Use medication by any route, position, wound care, and other measures to relive pain and suffering. May use oxygen, suction and manual treatment of airway obstruction as needed for comfort.   Comments nurse may pronounce      12/27/15 1309    Code Status History    Date Active Date Inactive Code Status Order ID Comments User Context   09/16/2015  6:52 PM 09/19/2015  7:29 PM Full Code [id number]  Baxter Hire, MD Inpatient   08/16/2015  5:14 PM 08/19/2015  5:35 PM DNR [id number]  Willia Craze, NP Inpatient      TOTAL TIME TAKING CARE OF THIS PATIENT: 33 minutes.    Samantha Aguirre,  Karenann Cai.D on 01/01/2016 at 10:51 AM  Between 7am to 6pm - Pager - [phone]  After 6pm go to [email or web address] - Proofreader  [facility]  Office  [phone]  CC: Primary care physician; Nobie Putnam, DO

## 2016-01-09 ENCOUNTER — Inpatient Hospital Stay: Payer: Medicare Other | Admitting: Family Medicine

## 2016-01-11 ENCOUNTER — Emergency Department: Payer: Medicare Other

## 2016-01-11 ENCOUNTER — Encounter: Payer: Self-pay | Admitting: Emergency Medicine

## 2016-01-11 ENCOUNTER — Emergency Department
Admission: EM | Admit: 2016-01-11 | Discharge: 2016-01-11 | Disposition: A | Discharge status: Home / Self Care | Payer: Medicare Other | Attending: Emergency Medicine | Admitting: Emergency Medicine

## 2016-01-11 DIAGNOSIS — I5030 Unspecified diastolic (congestive) heart failure: Secondary | ICD-10-CM | POA: Diagnosis not present

## 2016-01-11 DIAGNOSIS — Z79899 Other long term (current) drug therapy: Secondary | ICD-10-CM | POA: Diagnosis not present

## 2016-01-11 DIAGNOSIS — W010XXA Fall on same level from slipping, tripping and stumbling without subsequent striking against object, initial encounter: Secondary | ICD-10-CM | POA: Diagnosis not present

## 2016-01-11 DIAGNOSIS — E119 Type 2 diabetes mellitus without complications: Secondary | ICD-10-CM | POA: Insufficient documentation

## 2016-01-11 DIAGNOSIS — S0231XA Fracture of orbital floor, right side, initial encounter for closed fracture: Secondary | ICD-10-CM | POA: Diagnosis not present

## 2016-01-11 DIAGNOSIS — S66929S Laceration of unspecified muscle, fascia and tendon at wrist and hand level, unspecified hand, sequela: Secondary | ICD-10-CM | POA: Diagnosis not present

## 2016-01-11 DIAGNOSIS — R259 Unspecified abnormal involuntary movements: Secondary | ICD-10-CM | POA: Diagnosis not present

## 2016-01-11 DIAGNOSIS — I2510 Atherosclerotic heart disease of native coronary artery without angina pectoris: Secondary | ICD-10-CM | POA: Insufficient documentation

## 2016-01-11 DIAGNOSIS — J449 Chronic obstructive pulmonary disease, unspecified: Secondary | ICD-10-CM | POA: Diagnosis not present

## 2016-01-11 DIAGNOSIS — S0240CA Maxillary fracture, right side, initial encounter for closed fracture: Secondary | ICD-10-CM | POA: Diagnosis not present

## 2016-01-11 DIAGNOSIS — I110 Hypertensive heart disease with heart failure: Secondary | ICD-10-CM | POA: Insufficient documentation

## 2016-01-11 DIAGNOSIS — Y929 Unspecified place or not applicable: Secondary | ICD-10-CM | POA: Diagnosis not present

## 2016-01-11 DIAGNOSIS — S0990XA Unspecified injury of head, initial encounter: Secondary | ICD-10-CM | POA: Diagnosis not present

## 2016-01-11 DIAGNOSIS — J45909 Unspecified asthma, uncomplicated: Secondary | ICD-10-CM | POA: Insufficient documentation

## 2016-01-11 DIAGNOSIS — Y939 Activity, unspecified: Secondary | ICD-10-CM | POA: Diagnosis not present

## 2016-01-11 DIAGNOSIS — Y999 Unspecified external cause status: Secondary | ICD-10-CM | POA: Insufficient documentation

## 2016-01-11 DIAGNOSIS — S0292XA Unspecified fracture of facial bones, initial encounter for closed fracture: Secondary | ICD-10-CM | POA: Diagnosis not present

## 2016-01-11 DIAGNOSIS — Z85820 Personal history of malignant melanoma of skin: Secondary | ICD-10-CM | POA: Diagnosis not present

## 2016-01-11 DIAGNOSIS — F17210 Nicotine dependence, cigarettes, uncomplicated: Secondary | ICD-10-CM | POA: Diagnosis not present

## 2016-01-11 DIAGNOSIS — S0240EA Zygomatic fracture, right side, initial encounter for closed fracture: Secondary | ICD-10-CM | POA: Diagnosis not present

## 2016-01-11 DIAGNOSIS — S0993XA Unspecified injury of face, initial encounter: Secondary | ICD-10-CM | POA: Diagnosis present

## 2016-01-11 DIAGNOSIS — Z7984 Long term (current) use of oral hypoglycemic drugs: Secondary | ICD-10-CM | POA: Diagnosis not present

## 2016-01-11 DIAGNOSIS — S199XXA Unspecified injury of neck, initial encounter: Secondary | ICD-10-CM | POA: Diagnosis not present

## 2016-01-11 DIAGNOSIS — S02631A Fracture of coronoid process of right mandible, initial encounter for closed fracture: Secondary | ICD-10-CM | POA: Diagnosis not present

## 2016-01-11 MED ORDER — LIDOCAINE 5 % EX PTCH: 1.0000 | MEDICATED_PATCH | CUTANEOUS | 0 refills | Status: DC

## 2016-01-11 MED ORDER — ACETAMINOPHEN 325 MG PO TABS
650.0000 mg | ORAL_TABLET | Freq: Once | ORAL | Status: AC
  Administered 2016-01-11: 650 mg via ORAL
  Filled 2016-01-11: qty 2

## 2016-01-11 MED ORDER — PROMETHAZINE HCL 25 MG/ML IJ SOLN: 6.2500 mg | Freq: Once | INTRAMUSCULAR | Status: DC

## 2016-01-11 MED ORDER — DICLOFENAC SODIUM 1 % TD GEL: 2.0000 g | Freq: Four times a day (QID) | TRANSDERMAL | 1 refills | Status: DC

## 2016-01-11 MED ORDER — LIDOCAINE 5 % EX PTCH
1.0000 | MEDICATED_PATCH | CUTANEOUS | Status: DC
  Administered 2016-01-11: 1 via TRANSDERMAL
  Filled 2016-01-11: qty 1

## 2016-01-11 NOTE — Discharge Instructions (Signed)
Please use a liquid diet and take Tylenol over-the-counter for pain. You can have 2 extra strength (500 mg) tablets every 6 hours as needed for pain. Please apply ice to help with swelling which will decrease the intensity of the pain. Please continue all of your medications Please call the outpatient [facility] clinic for follow-up Please return immediately if condition worsens. Please contact her primary physician or the physician you were given for referral. If you have any specialist physicians involved in her treatment and plan please also contact them. Thank you for using [facility].

## 2016-01-11 NOTE — ED Triage Notes (Signed)
Pt in via EMS from home; pt reports "my foot slipped" causing her to fall down 2 steps from her front porch.  Pt with pain, swelling, numbness since the fall to right side of face.  Pt also complains of pain to right leg.  Pt A/Ox4, no immediate distress noted at this time.

## 2016-01-11 NOTE — ED Notes (Signed)
Patient transported to CT 
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
## 2016-01-15 ENCOUNTER — Inpatient Hospital Stay: Payer: Medicare Other | Admitting: Family Medicine

## 2016-01-16 ENCOUNTER — Ambulatory Visit: Payer: Medicare Other | Admitting: Family Medicine

## 2016-01-18 ENCOUNTER — Ambulatory Visit: Payer: Medicare Other | Admitting: Family Medicine

## 2016-01-22 ENCOUNTER — Ambulatory Visit
Admission: RE | Admit: 2016-01-22 | Discharge: 2016-01-22 | Disposition: A | Discharge status: Home / Self Care | Payer: Medicare Other | Source: Ambulatory Visit | Attending: Family Medicine | Admitting: Family Medicine

## 2016-01-22 ENCOUNTER — Encounter: Payer: Self-pay | Admitting: Family Medicine

## 2016-01-22 ENCOUNTER — Ambulatory Visit (INDEPENDENT_AMBULATORY_CARE_PROVIDER_SITE_OTHER): Payer: Medicare Other | Admitting: Family Medicine

## 2016-01-22 VITALS — BP 165/76 | HR 58 | Temp 97.8°F | Resp 16 | Ht 61.0 in | Wt 178.0 lb

## 2016-01-22 DIAGNOSIS — M79645 Pain in left finger(s): Secondary | ICD-10-CM | POA: Diagnosis not present

## 2016-01-22 DIAGNOSIS — W19XXXA Unspecified fall, initial encounter: Secondary | ICD-10-CM

## 2016-01-22 DIAGNOSIS — J410 Simple chronic bronchitis: Secondary | ICD-10-CM

## 2016-01-22 DIAGNOSIS — J441 Chronic obstructive pulmonary disease with (acute) exacerbation: Secondary | ICD-10-CM

## 2016-01-22 DIAGNOSIS — S62235A Other nondisplaced fracture of base of first metacarpal bone, left hand, initial encounter for closed fracture: Secondary | ICD-10-CM

## 2016-01-22 DIAGNOSIS — M1812 Unilateral primary osteoarthritis of first carpometacarpal joint, left hand: Secondary | ICD-10-CM | POA: Insufficient documentation

## 2016-01-22 DIAGNOSIS — W1830XA Fall on same level, unspecified, initial encounter: Secondary | ICD-10-CM | POA: Insufficient documentation

## 2016-01-22 DIAGNOSIS — S62255A Nondisplaced fracture of neck of first metacarpal bone, left hand, initial encounter for closed fracture: Secondary | ICD-10-CM | POA: Diagnosis not present

## 2016-01-22 DIAGNOSIS — S0292XA Unspecified fracture of facial bones, initial encounter for closed fracture: Secondary | ICD-10-CM

## 2016-01-22 NOTE — Assessment & Plan Note (Signed)
Resolved AECOPD, 12/14 to 12/19, finished prednisone, did not require antibiotics. - No residual wheezing or acute pulmonary findings today - Continue maintenance therapy and albuterol PRN

## 2016-01-22 NOTE — Assessment & Plan Note (Signed)
Newly identified acute L thumb non displaced subtle fracture at base of metacarpal, still residual pain and swelling, initial injury on 12/29 with fall and facial fractures, no prior x-rays. Limited movement of thumb due to pain  Plan: 1. Check Left thumb x-ray today - identified fracture, notify patient of results 2. Start Left Thumb Spica Splint for immobilization for now, already 1 week past injury, continue for next 2-4 weeks if gradually improving pain and swelling, then can follow-up as planned 02/28/16, otherwise if any worsening or persistent symptoms we can refer to Ortho for further evaluation may need custom splint vs casting, likely will heal well after several weeks

## 2016-01-22 NOTE — Patient Instructions (Signed)
Thank you for coming in to clinic today.  1. We will place urgent referral today to Maxillofacial Surgery, I am not positive how soon they can get you in, it may be later this week, hopefully you will be contacted with next 24-48 hours with an appointment. - We will try to find a location as close as we can that accepts your Medicaid/medicare insurance, unfortunately this may leave [place] with only option of [facility]  Here are possible options  1. [facility] Maxillofacial Surgery (this may be the location that ED contacted and they may not take Medicaid, we will check on this)  [address], [place], [place] [address] Tel: [facility] Phone Number [phone]  [phone] [address] [place], [place] [address] Tel: [employer] Number [phone]  2. Possibly [facility] also does some surgical evaluation, they may be able to help locally.   ENT [address], [place]  [place], [place] [address]  Phone: [phone]  3. Lastly [facility] Maxillofacial Surgery as you have  [facility] Office Locations [facility] Oral & Maxillofacial Surgery [address] [place], [place] [address] [phone]  - I think the bleeding is most likely from sinus bone fracture, blood was seen on CT scan, most likely you can have some recurrent bleeding, as long as this does not significantly get worse or continue bleeding, it should be ok, we will notify maxillofacial surgery, for now continue your aggrenox blood thinner, they will advise you further  Lungs are clear, resolved from COPD exacerbation  For your thumb, X-ray today we will notify you with results  Use RICE therapy: - R - Rest / relative rest with activity modification avoid overuse of joint - I - Ice packs (make sure you use a towel or sock / something to protect skin) - C - Compression with thumb splint or ACE wrap to apply pressure and reduce swelling allowing more support - E - Elevation - if  significant swelling, lift hand above heart level and keep it supported to reduce swelling   Please schedule a follow-up appointment with Dr. Parks Ranger in 4 weeks as needed for facial fractures / thumb pain if not improved, follow-up with Surgeon first  If you have any other questions or concerns, please feel free to call the clinic or send a message through [place]. You may also schedule an earlier appointment if necessary.  Nobie Putnam, DO [place]

## 2016-01-22 NOTE — Assessment & Plan Note (Signed)
Secondary to fracture, see A&P

## 2016-01-22 NOTE — Assessment & Plan Note (Signed)
Stable, with recent flare, now resolved Continue Breo, Albuterol PRN Consider pulm in future if needed
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
## 2016-01-22 NOTE — Progress Notes (Signed)
Subjective:    Patient ID: Samantha Aguirre, female    DOB: [birth year], 71 y.o.   MRN: [id number]  Samantha Aguirre is a 71 y.o. female presenting on 01/22/2016 for COPD (hospital follow up improved ) and Fall (hospital follow up still in pain )   HPI   Hospital Follow-up x 2 #1 - Hospitalization for COPD Exacerbation - 12/14 to 12/19 (date of discharge) #2 - ED visit on 01/11/16 for fall with multiple closed facial fractures Right side, discharged same day  Follow-up COPD Exacerbation - Today patient reports significantly improved breathing following her recent hospitalization. She was treated with prednisone and duoneb nebulizer in hospital, she was dischraged to continue 4 more days of prednisone, continue breo regularly, only used PRN Albuterol used it more regular for first few days out of hospital and then has not needed it more since. She has nebulizer at home but does not know where it is. - Denies any further dyspnea, chest pain or tightness, productive sputum, fevers/chills  ED Follow-up / Multiple closed Right Facial Fractures - [facility] ED visit on 01/11/16 - Imaging in ED with Head CT, C-spine CT, and Maxillofacial CT, overall with significant results of non displaced Right orbital floor fracture, Right lateral orbit fracture, Mildly depressed fracture of R zygomatic arch, fractures of anterior / posterior wall of R maxillary sinus with air-fluid level and blood, fracture of coronoid process of R mandible, see imaging reports for further details. - She reports overall feels a little improved, but still some facial discomfort, pain and pressure, with resolving bruising, has some localized facial swell still as well. Pain not completely controlled on Tylenol 500-1000mg  TID PRN and topical voltaren PRN (did not get lidocaine patches due to req PA), but it is improved, also using ice as needed. Also still has some residual dizziness and lightheadedness intermittently. - Describes initial  fall as foot slipped, but did admit to some mild dizziness and lightheadedness prior, but states this is not why she fell, and denies any other associated symptoms to trigger her fall (no LOC, chest pain, dyspnea). She was able to walk to grandson's house next door and they called ambulance. - She is still taking clear liquid diet mostly, has not started ensure or other supplement, was advised to do this and soft food only diet in ED, she can tolerate some eggs, oatmeal. Has had some weight loss by our chart only 2 lbs, she states about 12 lbs in past 1 month. - Advised to schedule with [facility] Maxillofacial surgery, but she has not scheduled yet, was interested in going more local, however limited options due to insurance states they checked locally but did not take medicaid - Admits associated Right upper lip facial numbness since injury, has not improved - Admits new onset symptom in past 2+ days with some coughing up bright red blood only small amounts will feel it dripping or draining in back of throat trigger a cough, denies darker blood or clots - Still taking Aggrenox for blood thinner, has history of CVA - Denies worsening headache, vision change, loss of vision, chest pain, dyspnea  LEFT THUMB PAIN / SWELLING, s/p Fall - She reports that she initially injured her Left thumb when she fell on 12/29, however did not get x-rays in hospital despite pain, still has persistent pain and swelling, without bruising in L thumb localized to base of thumb. Pain severe with any movement, other fingers, hand and wrist without pain or swelling. - Denies redness, bruising,  other joint injury   Social History  Substance Use Topics  . Smoking status: Current Every Day Smoker    Packs/day: 0.25    Years: 50.00    Types: Cigarettes  . Smokeless tobacco: Current User  . Alcohol use No    Review of Systems Per HPI unless specifically indicated above     Objective:    BP (!) 165/76   Pulse (!) 58   Temp  97.8 F (36.6 C) (Oral)   Resp 16   Ht 5\' 1"  (1.549 m)   Wt 178 lb (80.7 kg)   SpO2 100%   BMI 33.63 kg/m   Wt Readings from Last 3 Encounters:  01/22/16 178 lb (80.7 kg)  01/11/16 180 lb (81.6 kg)  12/27/15 180 lb (81.6 kg)    Physical Exam  Constitutional: She appears well-developed and well-nourished. No distress.  Chronically ill-appearing, some mild discomfort right facial pain, cooperative  HENT:  Head: Normocephalic.  Frontal / maxillary sinuses non-tender. Nares patent without purulence or edema. Bilateral TMs clear without erythema, effusion or bulging. Oropharynx clear without erythema, exudates, edema or asymmetry.  S/p Traumatic head injury. Right side of face maxillary region down to chin with evidence of bruising, gravity dependent now fading lighter yellow ecchymosis on lower cheek and darker ecchymosis settled on chin R > L. Mild fullness soft tissue edema with possible mild hematoma in Right lower jaw / facial area. Mildly tender. Point of most tenderness Right medial maxillary region over bony prominence. Mild tenderness as well Right lateral zygomatic area.  R TM slight evidence of increased pressure with fullness without bulging, no erythema, no blood behind TM. Left TM normal no bleeding. No bruising over mastoid process.  No other ecchymosis or tenderness over scalp or other facial.  Upper denture in place. Mild dry mucus mem.  Neck: Normal range of motion. Neck supple.  Cardiovascular: Normal rate, regular rhythm, normal heart sounds and intact distal pulses.   No murmur heard. Pulmonary/Chest: Effort normal and breath sounds normal. No respiratory distress. She has no wheezes. She has no rales.  Improved air movement.  Musculoskeletal:  Left Hand/Wrist/Thumb Inspection: Left thumb with some bulkiness with localized edema without ecchymosis, erythema. No other bulky MCP joints or deformity Palpation: Exquisite tenderness at base proximal phalanx of left thumb  and CMC joint at base of thumb. Otherwise non tender hand / wrist, carpal bones. No distinct anatomical snuff box or scaphoid tenderness.  ROM: Limited pain with thumb opposition and extension or any thumb movement Left hand. Otherwise full active wrist ROM flex / ext, ulnar / radial deviation Strength: 5/5 grip (without thumb, unable to test due to acute pain), wrist flex/ext Neurovascular: distally intact  Lymphadenopathy:    She has no cervical adenopathy.  Neurological: She is alert.  Skin: Skin is warm and dry. She is not diaphoretic.  Psychiatric: Her behavior is normal.  Nursing note and vitals reviewed.        CLINICAL DATA:  Fall.  Head injury  EXAM: CT HEAD WITHOUT CONTRAST  CT MAXILLOFACIAL WITHOUT CONTRAST  CT CERVICAL SPINE WITHOUT CONTRAST  TECHNIQUE: Multidetector CT imaging of the head, cervical spine, and maxillofacial structures were performed using the standard protocol without intravenous contrast. Multiplanar CT image reconstructions of the cervical spine and maxillofacial structures were also generated.  COMPARISON:  CTA 12/27/2015  FINDINGS: CT HEAD FINDINGS  Brain: Moderate atrophy. Chronic microvascular ischemic changes in the white matter. Chronic infarct left cerebellum.  Negative for acute infarct.  Negative for acute hemorrhage or mass  Vascular: No hyperdense vessel or unexpected calcification.  Skull: Right facial fractures described below. Negative for skull fracture.  Other: None  CT MAXILLOFACIAL FINDINGS  Osseous: Multiple right-sided facial fractures. Nondisplaced fracture right orbital floor. Fracture of the right lateral orbit. Mildly depressed fracture the right zygomatic arch. Fractures of the anterior and posterior wall of the right maxillary sinus with air-fluid level due to blood. Fracture of the coronoid process of the mandible on the right. No other fracture of the mandible.  Orbits: Soft tissue  swelling over the right orbit.  Sinuses: Air-fluid level right maxillary sinus due to fractures. Remaining sinuses clear.  Soft tissues: Soft tissue swelling right face and orbit. Hematoma right masseter muscle.  CT CERVICAL SPINE FINDINGS  Alignment: Normal  Skull base and vertebrae: Negative for fracture.  Soft tissues and spinal canal: Negative  Disc levels: Mild disc degeneration and spurring in the cervical spine. Mild facet degeneration in the lower cervical spine.  Upper chest: Lung apices clear  Other: None  IMPRESSION: Atrophy and chronic microvascular ischemic changes. No acute intracranial abnormality  Multiple right facial fractures as described above  Mild cervical spine degenerative change. Negative for cervical spine fracture.   Electronically Signed   By: Franchot Gallo M.D.   On: 01/11/2016 13:45  ---------------------------------------------  I have personally reviewed the radiology report from 01/22/2016 of Left Thumb  CLINICAL DATA:  [place] on 01/11/2016 with persistent pain at the base of the thumb  EXAM: LEFT THUMB 2+V  COMPARISON:  Left hand films of 08/28/2014  FINDINGS: There is a subtle nondisplaced fracture of the base of the left first metacarpal which involves the left first CMC joint. There is degenerative change of the left first [facility] joint as well. No other abnormality is seen.  IMPRESSION: 1. Subtle nondisplaced fracture of the base of the left first metacarpal. 2. Degenerative change of the left first [facility] joint   Electronically Signed   By: Ivar Drape M.D.   On: 01/22/2016 11:40.   Results for orders placed or performed during the hospital encounter of [id number]  Basic metabolic panel  Result Value Ref Range   Sodium 139 135 - 145 mmol/L   Potassium 3.1 (L) 3.5 - 5.1 mmol/L   Chloride 105 101 - 111 mmol/L   CO2 27 22 - 32 mmol/L   Glucose, Bld 149 (H) 65 - 99 mg/dL   BUN 11 6 - 20 mg/dL    Creatinine, Ser 0.95 0.44 - 1.00 mg/dL   Calcium 9.3 8.9 - 10.3 mg/dL   GFR calc non Af Amer 59 (L) >60 mL/min   GFR calc Af Amer >60 >60 mL/min   Anion gap 7 5 - 15  CBC  Result Value Ref Range   WBC 5.5 3.6 - 11.0 K/uL   RBC 4.59 3.80 - 5.20 MIL/uL   Hemoglobin 13.4 12.0 - 16.0 g/dL   HCT 39.4 35.0 - 47.0 %   MCV 85.9 80.0 - 100.0 fL   MCH 29.2 26.0 - 34.0 pg   MCHC 34.0 32.0 - 36.0 g/dL   RDW 17.1 (H) 11.5 - 14.5 %   Platelets 202 150 - 440 K/uL  Troponin I  Result Value Ref Range   Troponin I <0.03 <0.03 ng/mL  Brain natriuretic peptide  Result Value Ref Range   B Natriuretic Peptide 17.0 0.0 - 100.0 pg/mL  Influenza panel by PCR (type A & B, H1N1)  Result Value Ref Range  Influenza A By PCR NEGATIVE NEGATIVE   Influenza B By PCR NEGATIVE NEGATIVE  Basic metabolic panel  Result Value Ref Range   Sodium 140 135 - 145 mmol/L   Potassium 4.1 3.5 - 5.1 mmol/L   Chloride 108 101 - 111 mmol/L   CO2 23 22 - 32 mmol/L   Glucose, Bld 244 (H) 65 - 99 mg/dL   BUN 10 6 - 20 mg/dL   Creatinine, Ser 0.89 0.44 - 1.00 mg/dL   Calcium 9.3 8.9 - 10.3 mg/dL   GFR calc non Af Amer >60 >60 mL/min   GFR calc Af Amer >60 >60 mL/min   Anion gap 9 5 - 15  CBC  Result Value Ref Range   WBC 6.1 3.6 - 11.0 K/uL   RBC 4.21 3.80 - 5.20 MIL/uL   Hemoglobin 12.5 12.0 - 16.0 g/dL   HCT 36.9 35.0 - 47.0 %   MCV 87.8 80.0 - 100.0 fL   MCH 29.6 26.0 - 34.0 pg   MCHC 33.8 32.0 - 36.0 g/dL   RDW 17.8 (H) 11.5 - 14.5 %   Platelets 181 150 - 440 K/uL  Glucose, capillary  Result Value Ref Range   Glucose-Capillary 288 (H) 65 - 99 mg/dL  Glucose, capillary  Result Value Ref Range   Glucose-Capillary 238 (H) 65 - 99 mg/dL   Comment 1 Notify RN   Glucose, capillary  Result Value Ref Range   Glucose-Capillary 215 (H) 65 - 99 mg/dL   Comment 1 Notify RN   Glucose, capillary  Result Value Ref Range   Glucose-Capillary 251 (H) 65 - 99 mg/dL   Comment 1 Notify RN   Glucose, capillary  Result  Value Ref Range   Glucose-Capillary 255 (H) 65 - 99 mg/dL   Comment 1 Notify RN   Glucose, capillary  Result Value Ref Range   Glucose-Capillary 228 (H) 65 - 99 mg/dL   Comment 1 Notify RN   Glucose, capillary  Result Value Ref Range   Glucose-Capillary 216 (H) 65 - 99 mg/dL   Comment 1 Notify RN   Glucose, capillary  Result Value Ref Range   Glucose-Capillary 258 (H) 65 - 99 mg/dL   Comment 1 Notify RN   Glucose, capillary  Result Value Ref Range   Glucose-Capillary 214 (H) 65 - 99 mg/dL   Comment 1 Notify RN   Glucose, capillary  Result Value Ref Range   Glucose-Capillary 163 (H) 65 - 99 mg/dL   Comment 1 Notify RN   Glucose, capillary  Result Value Ref Range   Glucose-Capillary 176 (H) 65 - 99 mg/dL   Comment 1 Notify RN   Glucose, capillary  Result Value Ref Range   Glucose-Capillary 141 (H) 65 - 99 mg/dL   Comment 1 Notify RN   CBC  Result Value Ref Range   WBC 9.7 3.6 - 11.0 K/uL   RBC 4.19 3.80 - 5.20 MIL/uL   Hemoglobin 12.3 12.0 - 16.0 g/dL   HCT 35.9 35.0 - 47.0 %   MCV 85.5 80.0 - 100.0 fL   MCH 29.4 26.0 - 34.0 pg   MCHC 34.4 32.0 - 36.0 g/dL   RDW 17.4 (H) 11.5 - 14.5 %   Platelets 201 150 - 440 K/uL  Creatinine, serum  Result Value Ref Range   Creatinine, Ser 0.86 0.44 - 1.00 mg/dL   GFR calc non Af Amer >60 >60 mL/min   GFR calc Af Amer >60 >60 mL/min  Glucose, capillary  Result  Value Ref Range   Glucose-Capillary 196 (H) 65 - 99 mg/dL   Comment 1 Notify RN   Glucose, capillary  Result Value Ref Range   Glucose-Capillary 112 (H) 65 - 99 mg/dL   Comment 1 Notify RN   Glucose, capillary  Result Value Ref Range   Glucose-Capillary 180 (H) 65 - 99 mg/dL  Glucose, capillary  Result Value Ref Range   Glucose-Capillary 190 (H) 65 - 99 mg/dL   Comment 1 Notify RN   Glucose, capillary  Result Value Ref Range   Glucose-Capillary 207 (H) 65 - 99 mg/dL   Comment 1 Notify RN   Glucose, capillary  Result Value Ref Range   Glucose-Capillary 99 65 -  99 mg/dL  Glucose, capillary  Result Value Ref Range   Glucose-Capillary 164 (H) 65 - 99 mg/dL  Glucose, capillary  Result Value Ref Range   Glucose-Capillary 162 (H) 65 - 99 mg/dL      Assessment & Plan:   Problem List Items Addressed This Visit    Traumatic closed nondisplaced fracture of base of metacarpal bone of left thumb    Newly identified acute L thumb non displaced subtle fracture at base of metacarpal, still residual pain and swelling, initial injury on 12/29 with fall and facial fractures, no prior x-rays. Limited movement of thumb due to pain  Plan: 1. Check Left thumb x-ray today - identified fracture, notify patient of results 2. Start Left Thumb Spica Splint for immobilization for now, already 1 week past injury, continue for next 2-4 weeks if gradually improving pain and swelling, then can follow-up as planned 02/28/16, otherwise if any worsening or persistent symptoms we can refer to Ortho for further evaluation may need custom splint vs casting, likely will heal well after several weeks      Thumb pain, left    Secondary to fracture, see A&P      Relevant Orders   DG Finger Thumb Left (Completed)   RESOLVED: COPD exacerbation ([place])    Resolved AECOPD, 12/14 to 12/19, finished prednisone, did not require antibiotics. - No residual wheezing or acute pulmonary findings today - Continue maintenance therapy and albuterol PRN      COPD (chronic obstructive pulmonary disease) (HCC)    Stable, with recent flare, now resolved Continue Breo, Albuterol PRN Consider pulm in future if needed      Closed fracture of face bones due to fall ([place]) - Primary    New acute problem with multiple R sided non-displaced closed facial fractures involving lateral R orbit, orbital floor, zygomatic arch, mandible, following traumatic fall at home, without other complicating other etiology for fall. Complication with R upper lip facial numbness localized following injury, has hematoma  with slightly improving swelling and ecchymosis gravity dependent. Also concern with acute onset x 2 days some bloody drainage in back of throat/sinus, has coughed it up (seems to slowly be improving) - CT reports reviewed from Head, C-spine, Maxillofacial, no obvious intracranial abnormality acutely  Plan: 1. Urgent referral to [facility], concern with transportation but limited other local options, patient has CD with images from ED 2. Continue Tylenol, Voltaren, ice PRN pain 3. Advised to continue Aggrenox for now, not to hold given bleeding is improving 4. Follow-up as needed      Relevant Orders   Ambulatory referral to Oral Maxillofacial Surgery      No orders of the defined types were placed in this encounter.     Follow up plan: Return in about  4 weeks (around 02/19/2016), or if symptoms worsen or fail to improve, for facial fractures / thumb pain if not improved, follow-up wi.  Nobie Putnam, [facility] 01/22/2016, 12:13 PM

## 2016-02-19 ENCOUNTER — Encounter: Payer: Self-pay | Admitting: Emergency Medicine

## 2016-02-19 ENCOUNTER — Inpatient Hospital Stay
Admission: EM | Admit: 2016-02-19 | Discharge: 2016-02-21 | DRG: 948 | Disposition: A | Discharge status: Home Health | Payer: Medicare Other | Attending: Internal Medicine | Admitting: Internal Medicine

## 2016-02-19 ENCOUNTER — Inpatient Hospital Stay: Payer: Medicare Other

## 2016-02-19 ENCOUNTER — Emergency Department: Payer: Medicare Other

## 2016-02-19 DIAGNOSIS — K219 Gastro-esophageal reflux disease without esophagitis: Secondary | ICD-10-CM | POA: Diagnosis present

## 2016-02-19 DIAGNOSIS — Z7984 Long term (current) use of oral hypoglycemic drugs: Secondary | ICD-10-CM | POA: Diagnosis not present

## 2016-02-19 DIAGNOSIS — E1140 Type 2 diabetes mellitus with diabetic neuropathy, unspecified: Secondary | ICD-10-CM | POA: Diagnosis not present

## 2016-02-19 DIAGNOSIS — Z79899 Other long term (current) drug therapy: Secondary | ICD-10-CM

## 2016-02-19 DIAGNOSIS — Z993 Dependence on wheelchair: Secondary | ICD-10-CM

## 2016-02-19 DIAGNOSIS — R262 Difficulty in walking, not elsewhere classified: Secondary | ICD-10-CM

## 2016-02-19 DIAGNOSIS — R29810 Facial weakness: Secondary | ICD-10-CM | POA: Diagnosis present

## 2016-02-19 DIAGNOSIS — R29708 NIHSS score 8: Secondary | ICD-10-CM | POA: Diagnosis not present

## 2016-02-19 DIAGNOSIS — I639 Cerebral infarction, unspecified: Secondary | ICD-10-CM

## 2016-02-19 DIAGNOSIS — R531 Weakness: Secondary | ICD-10-CM | POA: Diagnosis not present

## 2016-02-19 DIAGNOSIS — Z955 Presence of coronary angioplasty implant and graft: Secondary | ICD-10-CM

## 2016-02-19 DIAGNOSIS — I4891 Unspecified atrial fibrillation: Secondary | ICD-10-CM | POA: Diagnosis present

## 2016-02-19 DIAGNOSIS — M1990 Unspecified osteoarthritis, unspecified site: Secondary | ICD-10-CM | POA: Diagnosis present

## 2016-02-19 DIAGNOSIS — E785 Hyperlipidemia, unspecified: Secondary | ICD-10-CM | POA: Diagnosis present

## 2016-02-19 DIAGNOSIS — I110 Hypertensive heart disease with heart failure: Secondary | ICD-10-CM | POA: Diagnosis present

## 2016-02-19 DIAGNOSIS — M25562 Pain in left knee: Secondary | ICD-10-CM | POA: Diagnosis not present

## 2016-02-19 DIAGNOSIS — I252 Old myocardial infarction: Secondary | ICD-10-CM | POA: Diagnosis not present

## 2016-02-19 DIAGNOSIS — G40909 Epilepsy, unspecified, not intractable, without status epilepticus: Secondary | ICD-10-CM | POA: Diagnosis present

## 2016-02-19 DIAGNOSIS — E119 Type 2 diabetes mellitus without complications: Secondary | ICD-10-CM | POA: Diagnosis present

## 2016-02-19 DIAGNOSIS — Z85828 Personal history of other malignant neoplasm of skin: Secondary | ICD-10-CM

## 2016-02-19 DIAGNOSIS — E7800 Pure hypercholesterolemia, unspecified: Secondary | ICD-10-CM | POA: Diagnosis present

## 2016-02-19 DIAGNOSIS — E876 Hypokalemia: Secondary | ICD-10-CM | POA: Diagnosis not present

## 2016-02-19 DIAGNOSIS — R4781 Slurred speech: Secondary | ICD-10-CM | POA: Diagnosis present

## 2016-02-19 DIAGNOSIS — Z8673 Personal history of transient ischemic attack (TIA), and cerebral infarction without residual deficits: Secondary | ICD-10-CM | POA: Diagnosis present

## 2016-02-19 DIAGNOSIS — I2510 Atherosclerotic heart disease of native coronary artery without angina pectoris: Secondary | ICD-10-CM | POA: Diagnosis present

## 2016-02-19 DIAGNOSIS — I5032 Chronic diastolic (congestive) heart failure: Secondary | ICD-10-CM | POA: Diagnosis present

## 2016-02-19 DIAGNOSIS — F17210 Nicotine dependence, cigarettes, uncomplicated: Secondary | ICD-10-CM | POA: Diagnosis not present

## 2016-02-19 DIAGNOSIS — I6350 Cerebral infarction due to unspecified occlusion or stenosis of unspecified cerebral artery: Secondary | ICD-10-CM | POA: Diagnosis not present

## 2016-02-19 DIAGNOSIS — J449 Chronic obstructive pulmonary disease, unspecified: Secondary | ICD-10-CM | POA: Diagnosis present

## 2016-02-19 DIAGNOSIS — R29706 NIHSS score 6: Secondary | ICD-10-CM | POA: Diagnosis present

## 2016-02-19 DIAGNOSIS — I63233 Cerebral infarction due to unspecified occlusion or stenosis of bilateral carotid arteries: Secondary | ICD-10-CM | POA: Diagnosis not present

## 2016-02-19 DIAGNOSIS — R29818 Other symptoms and signs involving the nervous system: Secondary | ICD-10-CM | POA: Diagnosis not present

## 2016-02-19 LAB — CBC
HEMATOCRIT: 36.6 % (ref 35.0–47.0)
HEMOGLOBIN: 12.8 g/dL (ref 12.0–16.0)
MCH: 31 pg (ref 26.0–34.0)
MCHC: 35 g/dL (ref 32.0–36.0)
MCV: 88.7 fL (ref 80.0–100.0)
Platelets: 256 10*3/uL (ref 150–440)
RBC: 4.13 MIL/uL (ref 3.80–5.20)
RDW: 17.2 % — ABNORMAL HIGH (ref 11.5–14.5)
WBC: 6.8 10*3/uL (ref 3.6–11.0)

## 2016-02-19 LAB — APTT: APTT: 31 s (ref 24–36)

## 2016-02-19 LAB — DIFFERENTIAL
BASOS ABS: 0 10*3/uL (ref 0–0.1)
BASOS PCT: 1 %
EOS ABS: 0 10*3/uL (ref 0–0.7)
Eosinophils Relative: 1 %
LYMPHS ABS: 2.7 10*3/uL (ref 1.0–3.6)
Lymphocytes Relative: 39 %
Monocytes Absolute: 0.4 10*3/uL (ref 0.2–0.9)
Monocytes Relative: 6 %
NEUTROS ABS: 3.6 10*3/uL (ref 1.4–6.5)
NEUTROS PCT: 53 %

## 2016-02-19 LAB — COMPREHENSIVE METABOLIC PANEL
ALBUMIN: 4.3 g/dL (ref 3.5–5.0)
ALT: 13 U/L — ABNORMAL LOW (ref 14–54)
AST: 25 U/L (ref 15–41)
Alkaline Phosphatase: 88 U/L (ref 38–126)
Anion gap: 8 (ref 5–15)
BILIRUBIN TOTAL: 0.6 mg/dL (ref 0.3–1.2)
BUN: 15 mg/dL (ref 6–20)
CHLORIDE: 104 mmol/L (ref 101–111)
CO2: 26 mmol/L (ref 22–32)
CREATININE: 0.82 mg/dL (ref 0.44–1.00)
Calcium: 8.8 mg/dL — ABNORMAL LOW (ref 8.9–10.3)
GFR calc Af Amer: >60 mL/min (ref >60)
GFR calc non Af Amer: >60 mL/min (ref >60)
GLUCOSE: 113 mg/dL — AB (ref 65–99)
POTASSIUM: 3.4 mmol/L — AB (ref 3.5–5.1)
Sodium: 138 mmol/L (ref 135–145)
Total Protein: 7.4 g/dL (ref 6.5–8.1)

## 2016-02-19 LAB — PROTIME-INR
INR: 0.92
Prothrombin Time: 12.3 seconds (ref 11.4–15.2)

## 2016-02-19 LAB — GLUCOSE, CAPILLARY
GLUCOSE-CAPILLARY: 87 mg/dL (ref 65–99)
Glucose-Capillary: 118 mg/dL — ABNORMAL HIGH (ref 65–99)
Glucose-Capillary: 114 mg/dL — ABNORMAL HIGH (ref 65–99)

## 2016-02-19 LAB — LIPID PANEL
Cholesterol: 422 mg/dL — ABNORMAL HIGH (ref 0–200)
HDL: 47 mg/dL (ref >40)
LDL CALC: 302 mg/dL — AB (ref 0–99)
TRIGLYCERIDES: 366 mg/dL — AB (ref <150)
Total CHOL/HDL Ratio: 9 RATIO
VLDL: 73 mg/dL — AB (ref 0–40)

## 2016-02-19 LAB — TROPONIN I: Troponin I: <0.03 ng/mL (ref <0.03)

## 2016-02-19 MED ORDER — ONDANSETRON HCL 4 MG PO TABS: 4.0000 mg | ORAL_TABLET | Freq: Four times a day (QID) | ORAL | Status: DC | PRN

## 2016-02-19 MED ORDER — FLUTICASONE FUROATE-VILANTEROL 100-25 MCG/INH IN AEPB
1.0000 | INHALATION_SPRAY | Freq: Every day | RESPIRATORY_TRACT | Status: DC
  Administered 2016-02-19 – 2016-02-21 (×3): 1 via RESPIRATORY_TRACT
  Filled 2016-02-19: qty 28

## 2016-02-19 MED ORDER — IPRATROPIUM-ALBUTEROL 0.5-2.5 (3) MG/3ML IN SOLN
3.0000 mL | RESPIRATORY_TRACT | Status: DC
  Administered 2016-02-19 – 2016-02-20 (×5): 3 mL via RESPIRATORY_TRACT
  Filled 2016-02-19 (×6): qty 3

## 2016-02-19 MED ORDER — LISINOPRIL 20 MG PO TABS
20.0000 mg | ORAL_TABLET | Freq: Every day | ORAL | Status: DC
  Administered 2016-02-19 – 2016-02-21 (×3): 20 mg via ORAL
  Filled 2016-02-19 (×3): qty 1

## 2016-02-19 MED ORDER — LIDOCAINE 5 % EX PTCH
1.0000 | MEDICATED_PATCH | CUTANEOUS | Status: DC
  Administered 2016-02-20 – 2016-02-21 (×2): 1 via TRANSDERMAL
  Filled 2016-02-19 (×3): qty 1

## 2016-02-19 MED ORDER — SODIUM CHLORIDE 0.9 % IV SOLN
30.0000 meq | Freq: Once | INTRAVENOUS | Status: AC
  Administered 2016-02-19: 30 meq via INTRAVENOUS
  Filled 2016-02-19: qty 15

## 2016-02-19 MED ORDER — GUAIFENESIN ER 600 MG PO TB12: 600.0000 mg | ORAL_TABLET | Freq: Two times a day (BID) | ORAL | Status: DC | PRN

## 2016-02-19 MED ORDER — SODIUM CHLORIDE 0.9% FLUSH
3.0000 mL | Freq: Two times a day (BID) | INTRAVENOUS | Status: DC
  Administered 2016-02-20 – 2016-02-21 (×3): 3 mL via INTRAVENOUS

## 2016-02-19 MED ORDER — ACETAMINOPHEN 650 MG RE SUPP: 650.0000 mg | Freq: Four times a day (QID) | RECTAL | Status: DC | PRN

## 2016-02-19 MED ORDER — LEVOTHYROXINE SODIUM 25 MCG PO TABS
125.0000 ug | ORAL_TABLET | ORAL | Status: DC
  Administered 2016-02-21: 10:00:00 125 ug via ORAL
  Filled 2016-02-19: qty 1

## 2016-02-19 MED ORDER — ACETAMINOPHEN 325 MG PO TABS
650.0000 mg | ORAL_TABLET | Freq: Four times a day (QID) | ORAL | Status: DC | PRN
  Administered 2016-02-20: 650 mg via ORAL
  Filled 2016-02-19: qty 2

## 2016-02-19 MED ORDER — ROSUVASTATIN CALCIUM 20 MG PO TABS
40.0000 mg | ORAL_TABLET | Freq: Every day | ORAL | Status: DC
  Administered 2016-02-19: 20:00:00 40 mg via ORAL
  Filled 2016-02-19 (×2): qty 2

## 2016-02-19 MED ORDER — NICOTINE 21 MG/24HR TD PT24
21.0000 mg | MEDICATED_PATCH | Freq: Every day | TRANSDERMAL | Status: DC
  Filled 2016-02-19 (×2): qty 1

## 2016-02-19 MED ORDER — FLUTICASONE PROPIONATE 50 MCG/ACT NA SUSP
1.0000 | Freq: Every day | NASAL | Status: DC | PRN
  Filled 2016-02-19: qty 16

## 2016-02-19 MED ORDER — GLIPIZIDE-METFORMIN HCL 5-500 MG PO TABS: 1.0000 | ORAL_TABLET | ORAL | Status: DC

## 2016-02-19 MED ORDER — ASPIRIN 81 MG PO CHEW
324.0000 mg | CHEWABLE_TABLET | Freq: Once | ORAL | Status: AC
Start: 2016-02-19 — End: 2016-02-19
  Administered 2016-02-19: 324 mg via ORAL
  Filled 2016-02-19: qty 4

## 2016-02-19 MED ORDER — ASPIRIN-DIPYRIDAMOLE ER 25-200 MG PO CP12
1.0000 | ORAL_CAPSULE | Freq: Two times a day (BID) | ORAL | Status: DC
  Administered 2016-02-19 – 2016-02-21 (×4): 1 via ORAL
  Filled 2016-02-19 (×4): qty 1

## 2016-02-19 MED ORDER — ONDANSETRON HCL 4 MG/2ML IJ SOLN: 4.0000 mg | Freq: Four times a day (QID) | INTRAMUSCULAR | Status: DC | PRN

## 2016-02-19 MED ORDER — LEVETIRACETAM 500 MG PO TABS
500.0000 mg | ORAL_TABLET | Freq: Two times a day (BID) | ORAL | Status: DC
  Administered 2016-02-19 – 2016-02-21 (×4): 500 mg via ORAL
  Filled 2016-02-19 (×4): qty 1

## 2016-02-19 MED ORDER — BACLOFEN 10 MG PO TABS: 5.0000 mg | ORAL_TABLET | Freq: Three times a day (TID) | ORAL | Status: DC | PRN

## 2016-02-19 MED ORDER — LORAZEPAM 2 MG/ML IJ SOLN
2.0000 mg | Freq: Once | INTRAMUSCULAR | Status: AC | PRN
  Administered 2016-02-19: 2 mg via INTRAVENOUS
  Filled 2016-02-19: qty 1

## 2016-02-19 MED ORDER — METFORMIN HCL 500 MG PO TABS
500.0000 mg | ORAL_TABLET | Freq: Every evening | ORAL | Status: DC
  Administered 2016-02-19 – 2016-02-21 (×2): 500 mg via ORAL
  Filled 2016-02-19 (×2): qty 1

## 2016-02-19 MED ORDER — GLIPIZIDE 5 MG PO TABS
5.0000 mg | ORAL_TABLET | Freq: Every evening | ORAL | Status: DC
  Administered 2016-02-19 – 2016-02-21 (×2): 5 mg via ORAL
  Filled 2016-02-19 (×2): qty 1

## 2016-02-19 MED ORDER — ENOXAPARIN SODIUM 40 MG/0.4ML ~~LOC~~ SOLN
40.0000 mg | SUBCUTANEOUS | Status: DC
  Administered 2016-02-19 – 2016-02-20 (×2): 40 mg via SUBCUTANEOUS
  Filled 2016-02-19 (×2): qty 0.4

## 2016-02-19 MED ORDER — INSULIN ASPART 100 UNIT/ML ~~LOC~~ SOLN: 0.0000 [IU] | Freq: Every day | SUBCUTANEOUS | Status: DC

## 2016-02-19 MED ORDER — DULOXETINE HCL 60 MG PO CPEP
60.0000 mg | ORAL_CAPSULE | Freq: Every day | ORAL | Status: DC
  Administered 2016-02-19 – 2016-02-20 (×2): 60 mg via ORAL
  Filled 2016-02-19 (×2): qty 1

## 2016-02-19 MED ORDER — SODIUM CHLORIDE 0.9 % IV SOLN
INTRAVENOUS | Status: DC
  Administered 2016-02-19: 20:00:00 via INTRAVENOUS

## 2016-02-19 MED ORDER — METFORMIN HCL 500 MG PO TABS
1000.0000 mg | ORAL_TABLET | Freq: Every day | ORAL | Status: DC
  Administered 2016-02-20 – 2016-02-21 (×2): 1000 mg via ORAL
  Filled 2016-02-19 (×2): qty 2

## 2016-02-19 MED ORDER — GABAPENTIN 400 MG PO CAPS
800.0000 mg | ORAL_CAPSULE | Freq: Two times a day (BID) | ORAL | Status: DC
  Administered 2016-02-19 – 2016-02-21 (×4): 800 mg via ORAL
  Filled 2016-02-19 (×4): qty 2

## 2016-02-19 MED ORDER — INSULIN ASPART 100 UNIT/ML ~~LOC~~ SOLN
0.0000 [IU] | Freq: Three times a day (TID) | SUBCUTANEOUS | Status: DC
  Administered 2016-02-20: 2 [IU] via SUBCUTANEOUS
  Administered 2016-02-20: 1 [IU] via SUBCUTANEOUS
  Filled 2016-02-19 (×2): qty 1

## 2016-02-19 MED ORDER — GLIPIZIDE 5 MG PO TABS
10.0000 mg | ORAL_TABLET | Freq: Every day | ORAL | Status: DC
  Administered 2016-02-20 – 2016-02-21 (×2): 10 mg via ORAL
  Filled 2016-02-19 (×2): qty 2

## 2016-02-19 NOTE — Consult Note (Signed)
Referring Physician: Alfred Levins    Chief Complaint: Left sided weakness  HPI: Samantha Aguirre is an 71 y.o. female with a history of stroke and TIA who reports that today she had acute onset of left sided facial droop.  Then noted weakness on the left side as well.  Patient presented for evaluation at that time.  Has a history of a hemorrhagic infarct per patient within the past year.  Currently on Aggrenox.  Initial NIHSS of 6.  Recently moved here from [facility].   At baseline attempts to walk but needs a wheelchair when out of the home.  While walking a few weeks ago had a fall and incurred multiple facial fractures.  Reports she requires assistance for all ADL's but is not incontinent.  Does require adult diapers.  Lives with family.  Date last known well: Date: 02/19/2016 Time last known well: Time: 11:00 tPA Given: No: History of ICH   Modified Rankin: Rankin Score=4  Past Medical History:  Diagnosis Date  . Acid reflux   . Arthritis    Bilateral knees, hands, feet  . Asthma   . Cancer ([place])   . CHF (congestive heart failure) ([facility])    Diastolic CHF  . COPD (chronic obstructive pulmonary disease) ([place])   . Coronary artery disease   . Diabetes mellitus without complication ([place])   . Frequent headaches   . Heart attack   . High cholesterol   . HLD (hyperlipidemia)   . Hypertension   . MI (myocardial infarction)   . Seizures ([place])   . Skin cancer 2015   Suspected basal cell carcinoma on nose, surgically removed  . Stroke ([place])   . Thyroid disease   . Tremors of nervous system     Past Surgical History:  Procedure Laterality Date  . ABDOMINAL HYSTERECTOMY  1976  . APPENDECTOMY  1980  . CAROTID ENDARTERECTOMY    . CHOLECYSTECTOMY  1980  . [place]  . KNEE SURGERY     left  . ROTATOR CUFF REPAIR     right  . TONSILLECTOMY  1950    Family History  Problem Relation Age of Onset  . Breast cancer Mother   . Heart disease Mother   . Stroke Mother    . Cancer Mother   . COPD Mother   . Heart disease Father   . Diabetes Father   . Stroke Father   . Alcohol abuse Sister   . Drug abuse Sister   . Stroke Sister   . Cancer Sister   . Mental illness Sister   . Heart disease Brother   . Arthritis Brother   . Diabetes Brother   . Heart disease Maternal Grandfather   . Heart disease Paternal Grandfather    Social History:  reports that she has been smoking Cigarettes.  She has a 12.50 pack-year smoking history. She uses smokeless tobacco. She reports that she does not drink alcohol or use drugs.  Allergies:  Allergies  Allergen Reactions  . Mushroom Extract Complex Anaphylaxis    Made patient "deathly sick"  . Atenolol     "MD took me off of it bc it was doing something wrong" Made patient HYPOTENSIVE  . Ivp Dye [Iodinated Diagnostic Agents] Itching    Pt injected with IV contrast.  5 min after injection pt complained of itching behind ear and on her abd.  . Morphine And Related Other (See Comments)    "stops my breathing" NEAR-RESPIRATORY ARREST  . Percocet [  Oxycodone-Acetaminophen] Nausea And Vomiting and Other (See Comments)    Stomach pains  . Percodan [Oxycodone-Aspirin] Nausea And Vomiting and Other (See Comments)    Stomach pains  . Verapamil Other (See Comments)    " MD told me this was screwing me up" MAKES PATIENT HYPOTENSIVE    Medications: I have reviewed the patient's current medications. Prior to Admission:  Prior to Admission medications   Medication Sig Start Date End Date Taking? Authorizing Provider  glipiZIDE-metformin (METAGLIP) 5-500 MG tablet Take 1-2 tablets by mouth See admin instructions. 2 tablets in the morning and 1 tablet at night 11/13/15  Yes Alexander Devin Going, DO  acetaminophen (TYLENOL) 325 MG tablet Take 650 mg by mouth at bedtime.    Historical Provider, MD  albuterol (PROVENTIL HFA;VENTOLIN HFA) 108 (90 Base) MCG/ACT inhaler Inhale 2 puffs into the lungs every 6 (six) hours as  needed for wheezing or shortness of breath. 01/01/16   Lytle Butte, MD  baclofen (LIORESAL) 10 MG tablet Take 0.5-1 tablets (5-10 mg total) by mouth 3 (three) times daily as needed for muscle spasms. 11/13/15   Olin Hauser, DO  diclofenac sodium (VOLTAREN) 1 % GEL Apply 2 g topically 4 (four) times daily. 01/11/16   Daymon Larsen, MD  dipyridamole-aspirin (AGGRENOX) 200-25 MG 12hr capsule Take 1 capsule by mouth 2 (two) times daily. 09/19/15   Epifanio Lesches, MD  DULoxetine (CYMBALTA) 60 MG capsule Take 1 capsule (60 mg total) by mouth at bedtime. 11/13/15   Devonne Doughty Karamalegos, DO  fluticasone (FLONASE) 50 MCG/ACT nasal spray Place 1 spray into both nostrils daily. 01/02/16   Lytle Butte, MD  fluticasone furoate-vilanterol (BREO ELLIPTA) 100-25 MCG/INH AEPB Inhale 1 puff into the lungs daily. 11/13/15   Olin Hauser, DO  gabapentin (NEURONTIN) 800 MG tablet Take 1 tablet (800 mg total) by mouth 2 (two) times daily. 11/13/15   Olin Hauser, DO  guaiFENesin (MUCINEX) 600 MG 12 hr tablet Take 1 tablet (600 mg total) by mouth 2 (two) times daily as needed for cough or to loosen phlegm. 01/01/16   Lytle Butte, MD  levETIRAcetam (KEPPRA) 500 MG tablet Take 1 tablet (500 mg total) by mouth 2 (two) times daily. 09/19/15   Epifanio Lesches, MD  levothyroxine (SYNTHROID, LEVOTHROID) 125 MCG tablet Take 1 tablet (125 mcg total) by mouth every other day. 11/13/15   Olin Hauser, DO  lidocaine (LIDODERM) 5 % Place 1 patch onto the skin daily. 01/11/16   Daymon Larsen, MD  lisinopril (PRINIVIL,ZESTRIL) 20 MG tablet Take 1 tablet (20 mg total) by mouth daily. 11/27/15   Olin Hauser, DO  predniSONE (DELTASONE) 10 MG tablet 40mg  x1 day, 20mg  x2 day, 10mg  x2 day then stop 01/01/16   Lytle Butte, MD  rosuvastatin (CRESTOR) 40 MG tablet Take 1 tablet (40 mg total) by mouth daily. 11/13/15   Olin Hauser, DO  umeclidinium bromide  (INCRUSE ELLIPTA) 62.5 MCG/INH AEPB Inhale 1 puff into the lungs daily. 11/13/15   Olin Hauser, DO     ROS: History obtained from the patient  General ROS: negative for - chills, fatigue, fever, night sweats, weight gain or weight loss Psychological ROS: negative for - behavioral disorder, hallucinations, memory difficulties, mood swings or suicidal ideation Ophthalmic ROS: negative for - blurry vision, double vision, eye pain or loss of vision ENT ROS: negative for - epistaxis, nasal discharge, oral lesions, sore throat, tinnitus or vertigo Allergy and Immunology ROS:  negative for - hives or itchy/watery eyes Hematological and Lymphatic ROS: negative for - bleeding problems, bruising or swollen lymph nodes Endocrine ROS: negative for - galactorrhea, hair pattern changes, polydipsia/polyuria or temperature intolerance Respiratory ROS: wheezing Cardiovascular ROS: negative for - chest pain, dyspnea on exertion, edema or irregular heartbeat Gastrointestinal ROS: negative for - abdominal pain, diarrhea, hematemesis, nausea/vomiting or stool incontinence Genito-Urinary ROS: negative for - dysuria, hematuria, incontinence or urinary frequency/urgency Musculoskeletal ROS: right face and left knee pain Neurological ROS: as noted in HPI Dermatological ROS: negative for rash and skin lesion changes  Physical Examination: Blood pressure (!) 179/92, pulse 61, temperature 97.5 F (36.4 C), temperature source Oral, resp. rate 14, SpO2 100 %.  HEENT-  Normocephalic, no lesions, without obvious abnormality.  Normal external eye and conjunctiva.  Normal TM's bilaterally.  Normal auditory canals and external ears. Normal external nose, mucus membranes and septum.  Normal pharynx. Cardiovascular- S1, S2 normal, pulses palpable throughout   Lungs- chest clear, no wheezing, rales, normal symmetric air entry Abdomen- soft, non-tender; bowel sounds normal; no masses,  no organomegaly Extremities-  no edema Lymph-no adenopathy palpable Musculoskeletal-no joint tenderness, deformity or swelling Skin-warm and dry, no hyperpigmentation, vitiligo, or suspicious lesions  Neurological Examination   Mental Status: Alert, oriented, thought content appropriate.  Speech fluent without evidence of aphasia.  Some mild dysarthria noted.  Able to follow 3 step commands without difficulty. Cranial Nerves: II: Discs flat bilaterally; Visual fields grossly normal with patient reporting that she has some blurring of vision in her periphery bilaterally, left greater than right, pupils equal, round, reactive to light and accommodation III,IV, VI: ptosis not present, extra-ocular motions intact bilaterally V,VII: left facial droop, facial light touch sensation decreased on the right VIII: hearing normal bilaterally IX,X: gag reflex present XI: bilateral shoulder shrug XII: midline tongue extension Motor: Right : Upper extremity   5/5    Left:     Upper extremity   5-/5  Lower extremity   5-/5     Lower extremity   4+/5 Tone and bulk:normal tone throughout; no atrophy noted Sensory: Pinprick and light touch decreased in the left upper and lower extremity Deep Tendon Reflexes: 1+ in the upper extremity and absent in the lower extremity Plantars: Right: upgoing   Left: mute Cerebellar: Mild dysmetria with left finger-to-nose testing and normal heel-to-shin testing bilaterally Gait: not tested due to safety concerns    Laboratory Studies:  Basic Metabolic Panel: No results for input(s): NA, K, CL, CO2, GLUCOSE, BUN, CREATININE, CALCIUM, MG, PHOS in the last 168 hours.  Liver Function Tests: No results for input(s): AST, ALT, ALKPHOS, BILITOT, PROT, ALBUMIN in the last 168 hours. No results for input(s): LIPASE, AMYLASE in the last 168 hours. No results for input(s): AMMONIA in the last 168 hours.  CBC: No results for input(s): WBC, NEUTROABS, HGB, HCT, MCV, PLT in the last 168 hours.  Cardiac  Enzymes: No results for input(s): CKTOTAL, CKMB, CKMBINDEX, TROPONINI in the last 168 hours.  BNP: Invalid input(s): POCBNP  CBG:  Recent Labs Lab 02/19/16 1135  GLUCAP 118*    Microbiology: Results for orders placed or performed during the hospital encounter of 08/16/15  Urine culture     Status: Abnormal   Collection Time: 08/16/15  3:17 PM  Result Value Ref Range Status   Specimen Description URINE, RANDOM  Final   Special Requests NONE  Final   Culture MULTIPLE SPECIES PRESENT, SUGGEST RECOLLECTION (A)  Final   Report Status 08/17/2015  FINAL  Final    Coagulation Studies: No results for input(s): LABPROT, INR in the last 72 hours.  Urinalysis: No results for input(s): COLORURINE, LABSPEC, PHURINE, GLUCOSEU, HGBUR, BILIRUBINUR, KETONESUR, PROTEINUR, UROBILINOGEN, NITRITE, LEUKOCYTESUR in the last 168 hours.  Invalid input(s): APPERANCEUR  Lipid Panel: No results found for: CHOL, TRIG, HDL, CHOLHDL, VLDL, LDLCALC  HgbA1C:  Lab Results  Component Value Date   HGBA1C 7.2 11/27/2015    Urine Drug Screen:  No results found for: LABOPIA, COCAINSCRNUR, LABBENZ, AMPHETMU, THCU, LABBARB  Alcohol Level: No results for input(s): ETH in the last 168 hours.  Other results: EKG: atrial fibrillation, rate 51 bpm.  Imaging: No results found.  Assessment: 71 y.o. female presenting with new onset left sided weakness.  Patient also with new onset atrial fibrillation as well.  Suspect embolic infarct as cause of presentation.  Patient not a tPA candidate due to history of cerebral hemorrhage.  Not a candidate for thrombectomy due to high modified rankin.  Patient with a recent, significant fall so even though she has new onset atrial fibrillation and is likely symptomatic from this, will not be an anticoagulation candidate.  Further work up recommended.  Stroke Risk Factors - atrial fibrillation, diabetes mellitus, hyperlipidemia, hypertension and smoking  Plan: 1. HgbA1c,  fasting lipid panel 2. MRI, MRA  of the brain without contrast 3. PT consult, OT consult, Speech consult 4. Echocardiogram 5. Carotid dopplers 6. Prophylactic therapy-Antiplatelet med: Aspirin - dose 325mg  daily 7. NPO until RN stroke swallow screen 8. Telemetry monitoring 9. Frequent neuro checks 10.  Smoking cessation counseling   Alexis Goodell, MD Neurology [phone] 02/19/2016, 12:01 PM

## 2016-02-19 NOTE — ED Notes (Signed)
Patient transported to MRI 

## 2016-02-19 NOTE — ED Provider Notes (Signed)
[facility] Emergency Department Provider Note  ____________________________________________  Time seen: Approximately 12:01 PM  I have reviewed the triage vital signs and the nursing notes.   HISTORY  Chief Complaint Code Stroke   HPI Samantha Aguirre is a 71 y.o. female with a history of multiple strokes and TIA with right-sided deficits on Aggrenox, self-reported history of hemorrhagic stroke at [facility], COPD, CHF, CAD, hypertension, hyperlipidemia who presents for evaluation of left-sided stroke symptoms. Last seen normal at 11 AM. Patient reports acute onset of left facial droop and weakness in her left upper lower extremities. Blood glucose on arrival was normal. Patient also endorses a mild global constant and nonradiating headache. No chest pain or shortness of breath, no changes in vision, no slurred speech or difficulty finding words. Patient versus compliance with her medications.  Past Medical History:  Diagnosis Date  . Acid reflux   . Arthritis    Bilateral knees, hands, feet  . Asthma   . Cancer ([place])   . CHF (congestive heart failure) (HCC)    Diastolic CHF  . COPD (chronic obstructive pulmonary disease) ([place])   . Coronary artery disease   . Diabetes mellitus without complication ([place])   . Frequent headaches   . Heart attack   . High cholesterol   . HLD (hyperlipidemia)   . Hypertension   . MI (myocardial infarction)   . Seizures ([place])   . Skin cancer 2015   Suspected basal cell carcinoma on nose, surgically removed  . Stroke ([place])   . Thyroid disease   . Tremors of nervous system     Patient Active Problem List   Diagnosis Date Noted  . Closed fracture of face bones due to fall ([place]) 01/22/2016  . Thumb pain, left 01/22/2016  . Traumatic closed nondisplaced fracture of base of metacarpal bone of left thumb 01/22/2016  . Colon cancer screening 11/27/2015  . Depression 11/14/2015  . Coronary artery disease  11/13/2015  . Hypertension 11/13/2015  . History of skin cancer 11/13/2015  . Osteoporosis 11/13/2015  . Chronic low back pain 11/13/2015  . Left medial knee pain 11/13/2015  . Osteoarthritis of multiple joints 11/13/2015  . Cerebral infarction ([place]) 09/16/2015  . CVA, old, facial weakness   . Right sided weakness 08/16/2015  . Other specified hypothyroidism 08/16/2015  . Controlled type 2 diabetes mellitus with diabetic neuropathy ([place]) 08/16/2015  . COPD (chronic obstructive pulmonary disease) ([place]) 08/16/2015  . Tobacco abuse 08/16/2015  . HLD (hyperlipidemia) 08/16/2015  . History of CHF (congestive heart failure) 08/16/2015  . Seizures ([place])     Past Surgical History:  Procedure Laterality Date  . ABDOMINAL HYSTERECTOMY  1976  . APPENDECTOMY  1980  . CAROTID ENDARTERECTOMY    . CHOLECYSTECTOMY  1980  . [facility]  . KNEE SURGERY     left  . ROTATOR CUFF REPAIR     right  . TONSILLECTOMY  1950    Prior to Admission medications   Medication Sig Start Date End Date Taking? Authorizing Provider  glipiZIDE-metformin (METAGLIP) 5-500 MG tablet Take 1-2 tablets by mouth See admin instructions. 2 tablets in the morning and 1 tablet at night 11/13/15  Yes Alexander Devin Going, DO  acetaminophen (TYLENOL) 325 MG tablet Take 650 mg by mouth at bedtime.    Historical Provider, MD  albuterol (PROVENTIL HFA;VENTOLIN HFA) 108 (90 Base) MCG/ACT inhaler Inhale 2 puffs into the lungs every 6 (six) hours as needed for wheezing or  shortness of breath. 01/01/16   Lytle Butte, MD  baclofen (LIORESAL) 10 MG tablet Take 0.5-1 tablets (5-10 mg total) by mouth 3 (three) times daily as needed for muscle spasms. 11/13/15   Olin Hauser, DO  diclofenac sodium (VOLTAREN) 1 % GEL Apply 2 g topically 4 (four) times daily. 01/11/16   Daymon Larsen, MD  dipyridamole-aspirin (AGGRENOX) 200-25 MG 12hr capsule Take 1 capsule by mouth 2 (two) times daily. 09/19/15   Epifanio Lesches, MD  DULoxetine (CYMBALTA) 60 MG capsule Take 1 capsule (60 mg total) by mouth at bedtime. 11/13/15   Devonne Doughty Karamalegos, DO  fluticasone (FLONASE) 50 MCG/ACT nasal spray Place 1 spray into both nostrils daily. 01/02/16   Lytle Butte, MD  fluticasone furoate-vilanterol (BREO ELLIPTA) 100-25 MCG/INH AEPB Inhale 1 puff into the lungs daily. 11/13/15   Olin Hauser, DO  gabapentin (NEURONTIN) 800 MG tablet Take 1 tablet (800 mg total) by mouth 2 (two) times daily. 11/13/15   Olin Hauser, DO  guaiFENesin (MUCINEX) 600 MG 12 hr tablet Take 1 tablet (600 mg total) by mouth 2 (two) times daily as needed for cough or to loosen phlegm. 01/01/16   Lytle Butte, MD  levETIRAcetam (KEPPRA) 500 MG tablet Take 1 tablet (500 mg total) by mouth 2 (two) times daily. 09/19/15   Epifanio Lesches, MD  levothyroxine (SYNTHROID, LEVOTHROID) 125 MCG tablet Take 1 tablet (125 mcg total) by mouth every other day. 11/13/15   Olin Hauser, DO  lidocaine (LIDODERM) 5 % Place 1 patch onto the skin daily. 01/11/16   Daymon Larsen, MD  lisinopril (PRINIVIL,ZESTRIL) 20 MG tablet Take 1 tablet (20 mg total) by mouth daily. 11/27/15   Olin Hauser, DO  predniSONE (DELTASONE) 10 MG tablet 40mg  x1 day, 20mg  x2 day, 10mg  x2 day then stop 01/01/16   Lytle Butte, MD  rosuvastatin (CRESTOR) 40 MG tablet Take 1 tablet (40 mg total) by mouth daily. 11/13/15   Olin Hauser, DO  umeclidinium bromide (INCRUSE ELLIPTA) 62.5 MCG/INH AEPB Inhale 1 puff into the lungs daily. 11/13/15   Olin Hauser, DO    Allergies Mushroom extract complex; Atenolol; Ivp dye [iodinated diagnostic agents]; Morphine and related; Percocet [oxycodone-acetaminophen]; Percodan [oxycodone-aspirin]; and Verapamil  Family History  Problem Relation Age of Onset  . Breast cancer Mother   . Heart disease Mother   . Stroke Mother   . Cancer Mother   . COPD Mother   . Heart disease  Father   . Diabetes Father   . Stroke Father   . Alcohol abuse Sister   . Drug abuse Sister   . Stroke Sister   . Cancer Sister   . Mental illness Sister   . Heart disease Brother   . Arthritis Brother   . Diabetes Brother   . Heart disease Maternal Grandfather   . Heart disease Paternal Grandfather     Social History Social History  Substance Use Topics  . Smoking status: Current Every Day Smoker    Packs/day: 0.25    Years: 50.00    Types: Cigarettes  . Smokeless tobacco: Current User  . Alcohol use No    Review of Systems  Constitutional: Negative for fever. Eyes: Negative for visual changes. ENT: Negative for sore throat. Neck: No neck pain  Cardiovascular: Negative for chest pain. Respiratory: Negative for shortness of breath. Gastrointestinal: Negative for abdominal pain, vomiting or diarrhea. Genitourinary: Negative for dysuria. Musculoskeletal: Negative for back  pain. Skin: Negative for rash. Neurological: + HA, L facial droop and L sided weakness Psych: No SI or HI  ____________________________________________   PHYSICAL EXAM:  VITAL SIGNS: ED Triage Vitals  Enc Vitals Group     BP 02/19/16 1133 (!) 176/76     Pulse Rate 02/19/16 1133 (!) 57     Resp 02/19/16 1133 18     Temp 02/19/16 1133 97.5 F (36.4 C)     Temp Source 02/19/16 1133 Oral     SpO2 02/19/16 1133 100 %     Weight --      Height --      Head Circumference --      Peak Flow --      Pain Score 02/19/16 1139 10     Pain Loc --      Pain Edu? --      Excl. in [place]? --     Constitutional: Alert and oriented. Well appearing and in no apparent distress. HEENT:      Head: Normocephalic and atraumatic.         Eyes: Conjunctivae are normal. Sclera is non-icteric. EOMI. PERRL      Mouth/Throat: Mucous membranes are moist.       Neck: Supple with no signs of meningismus. Cardiovascular: Regular rate and rhythm. No murmurs, gallops, or rubs. 2+ symmetrical distal pulses are present in  all extremities. No JVD. Respiratory: Normal respiratory effort. Lungs are clear to auscultation bilaterally. No wheezes, crackles, or rhonchi.  Gastrointestinal: Soft, non tender, and non distended with positive bowel sounds. No rebound or guarding. Genitourinary: No CVA tenderness. Musculoskeletal: Nontender with normal range of motion in all extremities. No edema, cyanosis, or erythema of extremities. Neurologic: NIH Stroke Scale  Interval: Baseline Time: 12:13 PM Person Administering Scale: [place] stroke scale items in the order listed. Record performance in each category after each subscale exam. Do not go back and change scores. Follow directions provided for each exam technique. Scores should reflect what the patient does, not what the clinician thinks the patient can do. The clinician should record answers while administering the exam and work quickly. Except where indicated, the patient should not be coached (i.e., repeated requests to patient to make a special effort).   1a  Level of consciousness: 0=alert; keenly responsive  1b. LOC questions:  0=Performs both tasks correctly  1c. LOC commands: 0=Performs both tasks correctly  2.  Best Gaze: 0=normal  3.  Visual: 0=No visual loss  4. Facial Palsy: 1=Minor paralysis (flattened nasolabial fold, asymmetric on smiling)  5a.  Motor left arm: 1=Drift, limb holds 90 (or 45) degrees but drifts down before full 10 seconds: does not hit bed  5b.  Motor right arm: 0=No drift, limb holds 90 (or 45) degrees for full 10 seconds  6a. motor left leg: 1=Drift, limb holds 90 (or 45) degrees but drifts down before full 10 seconds: does not hit bed  6b  Motor right leg:  0=No drift, limb holds 90 (or 45) degrees for full 10 seconds  7. Limb Ataxia: 2=Present in two limbs  8.  Sensory: 2=Severe to total sensory loss; patient is not aware of being touched in face, arm, leg  9. Best Language:  0=No aphasia, normal  10.  Dysarthria: 0=Normal  11. Extinction and Inattention: 1=Visual, tactile, auditory, spatial or personal inattention or extinction to bilateral simultaneous stimulation in one of the sensory modalities   Total:   8    Skin: Skin  is warm, dry and intact. No rash noted. Psychiatric: Mood and affect are normal. Speech and behavior are normal.  ____________________________________________   LABS (all labs ordered are listed, but only abnormal results are displayed)  Labs Reviewed  GLUCOSE, CAPILLARY - Abnormal; Notable for the following:       Result Value   Glucose-Capillary 118 (*)    All other components within normal limits  PROTIME-INR  APTT  CBC  DIFFERENTIAL  COMPREHENSIVE METABOLIC PANEL  TROPONIN I  CBG MONITORING, ED   ____________________________________________  EKG  ED ECG REPORT I, Rudene Re, the attending physician, personally viewed and interpreted this ECG.  Atrial fibrillation, rate of 51, normal QTC, normal axis, no ST elevations or depressions. A. fib is now new from prior  ____________________________________________  RADIOLOGY  Head CT: No acute findigns  ____________________________________________   PROCEDURES  Procedure(s) performed: None Procedures Critical Care performed:  None ____________________________________________   INITIAL IMPRESSION / ASSESSMENT AND PLAN / ED COURSE  71 y.o. female with a history of multiple strokes and TIA with right-sided deficits on Aggrenox, self-reported history of hemorrhagic stroke at [facility], COPD, CHF, CAD, hypertension, hyperlipidemia who presents for evaluation of left-sided stroke symptoms. Last seen well at 11 AM. NIH stroke scale on arrival per my evaluation with a score of 8. Patient has reported a history of a hemorrhagic stroke therefore she is not a TPA candidate. CT head with no acute findings. Patient was evaluated by ED and neurology upon arrival. Neurology recommended aspirin.  CTA was considered however patient has allergy to contrast and RANKIN 4. Neurology recommended admission.     Pertinent labs & imaging results that were available during my care of the patient were reviewed by me and considered in my medical decision making (see chart for details).    ____________________________________________   FINAL CLINICAL IMPRESSION(S) / ED DIAGNOSES  Final diagnoses:  Cerebrovascular accident (CVA), unspecified mechanism ([place])      NEW MEDICATIONS STARTED DURING THIS VISIT:  New Prescriptions   No medications on file     Note:  This document was prepared using Dragon voice recognition software and may include unintentional dictation errors.    Rudene Re, MD 02/19/16 1213

## 2016-02-19 NOTE — ED Triage Notes (Signed)
Patient is comlpaining of left sided facial droop and heaviness to her left arm for 30 min prior to arrival.  Patient is also complaining of headache.

## 2016-02-19 NOTE — Progress Notes (Signed)
[place] responded to a code stroke page for a Pt in ED Rm 14. The Pt was medical team was attending to the Pt when the [facility] arrived. [place] waited for the medical team to complete the observation and talked to the Pt who was alert and communicative. Pt was by herself and told the [facility] that she didn't want the family members to be contacted. Pt requested for prayers, which the Chaplain provided. The left after Pt stabilized.     02/19/16 1600  Clinical Encounter Type  Visited With Patient  Visit Type Initial;Spiritual support;Trauma  Referral From Nurse  Consult/Referral To Chaplain  Spiritual Encounters  Spiritual Needs Prayer;Emotional

## 2016-02-19 NOTE — ED Notes (Signed)
Code  Stroke  Called  To [phone] 

## 2016-02-19 NOTE — ED Triage Notes (Signed)
Pt started with left facial droop and severe headache at 1100 today. Hx stroke. Significant left facial droop present. Left arm feels heavy.

## 2016-02-19 NOTE — H&P (Addendum)
[facility] NAME: Samantha Aguirre    MR#:  [id number]  DATE OF BIRTH:  1945-08-04  DATE OF ADMISSION:  02/19/2016  PRIMARY CARE PHYSICIAN: Nobie Putnam, DO   REQUESTING/REFERRING PHYSICIAN: Dr. Rudene Re  CHIEF COMPLAINT:   Chief Complaint  Patient presents with  . Code Stroke    HISTORY OF PRESENT ILLNESS:  Samantha Aguirre  is a 71 y.o. female with a known history of CAD s/p stents, h/o tachycardia s/p ablation in past, COPD not on home oxygen, ongoing smoking, diabetes mellitus, hypertension, seizure disorder, hyperlipidemia, skin cancer and arthritis who is wheelchair-bound/walker bound at baseline presents to hospital secondary to left-sided weakness that started this morning. Patient does not know that she she had history of atrial fibrillation/flutter. Noted to be in new onset A. fib today. Has had prior strokes and mini strokes with right-sided symptoms. Her right side has almost recovered completely. She has weakness from arthritis and has a wheelchair and walker at baseline.. This morning she started having left arm and leg weakness, worse in the lower extremity that started about 3 hours from presentation. couldnt give TPA as h/o hemorrhagic strokes. CT of the head did not show  any acute findings. She has been on Aggrenox prior to admission. Also noted to have some slurred speech with mild left facial droop which is new according to patient. Denies any other tingling, numbness or vision changes. No nausea vomiting or diarrhea. Has been complaining of cough and shortness of breath for the last few days. Has significant wheezing on exam as well.  PAST MEDICAL HISTORY:   Past Medical History:  Diagnosis Date  . Acid reflux   . Arthritis    Bilateral knees, hands, feet  . Asthma   . Cancer ([place])   . CHF (congestive heart failure) (HCC)    Diastolic CHF  . COPD (chronic obstructive pulmonary disease) ([place])    . Coronary artery disease   . Diabetes mellitus without complication ([place])   . Frequent headaches   . Heart attack   . High cholesterol   . HLD (hyperlipidemia)   . Hypertension   . MI (myocardial infarction)   . Seizures ([place])   . Skin cancer 2015   Suspected basal cell carcinoma on nose, surgically removed  . Stroke ([place])   . Thyroid disease   . Tremors of nervous system     PAST SURGICAL HISTORY:   Past Surgical History:  Procedure Laterality Date  . ABDOMINAL HYSTERECTOMY  1976  . APPENDECTOMY  1980  . CAROTID ENDARTERECTOMY    . CHOLECYSTECTOMY  1980  . [place]  . KNEE SURGERY     left  . ROTATOR CUFF REPAIR     right  . TONSILLECTOMY  1950    SOCIAL HISTORY:   Social History  Substance Use Topics  . Smoking status: Current Every Day Smoker    Packs/day: 0.25    Years: 50.00    Types: Cigarettes  . Smokeless tobacco: Current User  . Alcohol use No    FAMILY HISTORY:   Family History  Problem Relation Age of Onset  . Breast cancer Mother   . Heart disease Mother   . Stroke Mother   . Cancer Mother   . COPD Mother   . Heart disease Father   . Diabetes Father   . Stroke Father   . Alcohol abuse Sister   . Drug abuse Sister   .  Stroke Sister   . Cancer Sister   . Mental illness Sister   . Heart disease Brother   . Arthritis Brother   . Diabetes Brother   . Heart disease Maternal Grandfather   . Heart disease Paternal Grandfather     DRUG ALLERGIES:   Allergies  Allergen Reactions  . Mushroom Extract Complex Anaphylaxis    Made patient "deathly sick"  . Atenolol     "MD took me off of it bc it was doing something wrong" Made patient HYPOTENSIVE  . Ivp Dye [Iodinated Diagnostic Agents] Itching    Pt injected with IV contrast.  5 min after injection pt complained of itching behind ear and on her abd.  . Morphine And Related Other (See Comments)    "stops my breathing" NEAR-RESPIRATORY ARREST  . Percocet  [Oxycodone-Acetaminophen] Nausea And Vomiting and Other (See Comments)    Stomach pains  . Percodan [Oxycodone-Aspirin] Nausea And Vomiting and Other (See Comments)    Stomach pains  . Verapamil Other (See Comments)    " MD told me this was screwing me up" MAKES PATIENT HYPOTENSIVE    REVIEW OF SYSTEMS:   Review of Systems  Constitutional: Positive for chills and malaise/fatigue. Negative for fever and weight loss.  HENT: Negative for ear discharge, hearing loss and nosebleeds.   Eyes: Positive for blurred vision. Negative for double vision and photophobia.  Respiratory: Positive for cough, shortness of breath and wheezing. Negative for hemoptysis.   Cardiovascular: Negative for chest pain, palpitations, orthopnea and leg swelling.  Gastrointestinal: Positive for nausea. Negative for abdominal pain, constipation, diarrhea, heartburn, melena and vomiting.  Genitourinary: Negative for dysuria and urgency.  Musculoskeletal: Positive for back pain. Negative for myalgias and neck pain.  Skin: Negative for rash.  Neurological: Positive for speech change and focal weakness. Negative for dizziness, tremors, sensory change and headaches.  Endo/Heme/Allergies: Does not bruise/bleed easily.  Psychiatric/Behavioral: Negative for depression.    MEDICATIONS AT HOME:   Prior to Admission medications   Medication Sig Start Date End Date Taking? Authorizing Provider  acetaminophen (TYLENOL) 325 MG tablet Take 650 mg by mouth at bedtime.   Yes Historical Provider, MD  baclofen (LIORESAL) 10 MG tablet Take 0.5-1 tablets (5-10 mg total) by mouth 3 (three) times daily as needed for muscle spasms. 11/13/15  Yes Alexander Devin Going, DO  diclofenac sodium (VOLTAREN) 1 % GEL Apply 2 g topically 4 (four) times daily. 01/11/16  Yes Daymon Larsen, MD  dipyridamole-aspirin (AGGRENOX) 200-25 MG 12hr capsule Take 1 capsule by mouth 2 (two) times daily. 09/19/15  Yes Epifanio Lesches, MD  DULoxetine  (CYMBALTA) 60 MG capsule Take 1 capsule (60 mg total) by mouth at bedtime. 11/13/15  Yes Alexander J Karamalegos, DO  fluticasone (FLONASE) 50 MCG/ACT nasal spray Place 1 spray into both nostrils daily. 01/02/16  Yes Lytle Butte, MD  fluticasone furoate-vilanterol (BREO ELLIPTA) 100-25 MCG/INH AEPB Inhale 1 puff into the lungs daily. 11/13/15  Yes Alexander J Karamalegos, DO  gabapentin (NEURONTIN) 800 MG tablet Take 1 tablet (800 mg total) by mouth 2 (two) times daily. 11/13/15  Yes Alexander J Karamalegos, DO  glipiZIDE-metformin (METAGLIP) 5-500 MG tablet Take 1-2 tablets by mouth See admin instructions. 2 tablets in the morning and 1 tablet at night 11/13/15  Yes Alexander J Karamalegos, DO  guaiFENesin (MUCINEX) 600 MG 12 hr tablet Take 1 tablet (600 mg total) by mouth 2 (two) times daily as needed for cough or to loosen phlegm. 01/01/16  Yes  Lytle Butte, MD  levothyroxine (SYNTHROID, LEVOTHROID) 125 MCG tablet Take 1 tablet (125 mcg total) by mouth every other day. 11/13/15  Yes Alexander J Karamalegos, DO  lisinopril (PRINIVIL,ZESTRIL) 20 MG tablet Take 1 tablet (20 mg total) by mouth daily. 11/27/15  Yes Alexander Devin Going, DO  rosuvastatin (CRESTOR) 40 MG tablet Take 1 tablet (40 mg total) by mouth daily. 11/13/15  Yes Alexander J Karamalegos, DO  umeclidinium bromide (INCRUSE ELLIPTA) 62.5 MCG/INH AEPB Inhale 1 puff into the lungs daily. 11/13/15  Yes Alexander Devin Going, DO  albuterol (PROVENTIL HFA;VENTOLIN HFA) 108 (90 Base) MCG/ACT inhaler Inhale 2 puffs into the lungs every 6 (six) hours as needed for wheezing or shortness of breath. Patient not taking: Reported on 02/19/2016 01/01/16   Lytle Butte, MD  levETIRAcetam (KEPPRA) 500 MG tablet Take 1 tablet (500 mg total) by mouth 2 (two) times daily. 09/19/15   Epifanio Lesches, MD  lidocaine (LIDODERM) 5 % Place 1 patch onto the skin daily. 01/11/16   Daymon Larsen, MD  predniSONE (DELTASONE) 10 MG tablet 40mg  x1 day, 20mg   x2 day, 10mg  x2 day then stop Patient not taking: Reported on 02/19/2016 01/01/16   Lytle Butte, MD      VITAL SIGNS:  Blood pressure (!) 173/105, pulse (!) 51, temperature 97.7 F (36.5 C), resp. rate 13, SpO2 100 %.  PHYSICAL EXAMINATION:   Physical Exam  GENERAL:  71 y.o.-year-old elderly appearing patient lying in the bed with no acute distress.  EYES: Pupils equal, round, reactive to light and accommodation. No scleral icterus. Extraocular muscles intact.  HEENT: Head atraumatic, normocephalic. Oropharynx and nasopharynx clear. Slight left facial droop noted. NECK:  Supple, no jugular venous distention. No thyroid enlargement, no tenderness.  LUNGS: significant bilateral expiratory wheezing, no rales,rhonchi or crepitation. No use of accessory muscles of respiration.  CARDIOVASCULAR: S1, S2 normal. No murmurs, rubs, or gallops.  ABDOMEN: Soft, nontender, nondistended. Bowel sounds present. No organomegaly or mass.  EXTREMITIES: No pedal edema, cyanosis, or clubbing.  NEUROLOGIC: Cranial nerves II through XII are intact although slight left facial droop noted.  Muscle strength 5/5 in both RUE and RLE, LLE is 2/5, LUE is 4/5, sensation intact.   Gait not checked.  PSYCHIATRIC: The patient is alert and oriented x 3.  SKIN: No obvious rash, lesion, or ulcer.   LABORATORY PANEL:   CBC  Recent Labs Lab 02/19/16 1140  WBC 6.8  HGB 12.8  HCT 36.6  PLT 256   ------------------------------------------------------------------------------------------------------------------  Chemistries   Recent Labs Lab 02/19/16 1140  NA 138  K 3.4*  CL 104  CO2 26  GLUCOSE 113*  BUN 15  CREATININE 0.82  CALCIUM 8.8*  AST 25  ALT 13*  ALKPHOS 88  BILITOT 0.6   ------------------------------------------------------------------------------------------------------------------  Cardiac Enzymes  Recent Labs Lab 02/19/16 1140  TROPONINI <0.03    ------------------------------------------------------------------------------------------------------------------  RADIOLOGY:  Ct Head Code Stroke W/o Cm  Result Date: 02/19/2016 CLINICAL DATA:  Code stroke.  Left facial droop and severe headache. EXAM: CT HEAD WITHOUT CONTRAST TECHNIQUE: Contiguous axial images were obtained from the base of the skull through the vertex without intravenous contrast. COMPARISON:  01/11/2016 FINDINGS: Brain: No evidence of acute infarction, hemorrhage, hydrocephalus, extra-axial collection or mass effect. Extensive chronic microvascular disease with patchy ischemic gliosis throughout the cerebral white matter. Remote small vessel infarcts in the bilateral cerebellum. Normal brain volume. Right parasagittal rounded calcified mass is stable and likely a meningioma, 13 mm in diameter. Left  cavernous sinus region mass described on 09/18/2015 brain MRI is not visualized on this study. Vascular: No hyperdense vessel Skull: No acute or aggressive finding Sinuses/Orbits: No acute finding Other: These results were called by telephone at the time of interpretation on 02/19/2016 at 11:59 am to Dr. Rudene Re , who verbally acknowledged these results. ASPECTS [facility] Stroke Program Early CT Score) - Ganglionic level infarction (caudate, lentiform nuclei, internal capsule, insula, M1-M3 cortex): 7 - Supraganglionic infarction (M4-M6 cortex): 3 Total score (0-10 with 10 being normal): 10 IMPRESSION: 1. No acute finding. ASPECTS is 10. 2. Extensive chronic microvascular disease. Electronically Signed   By: Monte Fantasia M.D.   On: 02/19/2016 12:04    EKG:   Orders placed or performed during the hospital encounter of 02/19/16  . ED EKG  . ED EKG  . EKG 12-Lead  . EKG 12-Lead    IMPRESSION AND PLAN:   Samantha Aguirre  is a 71 y.o. female with a known history of CAD s/p stents, h/o tachycardia s/p ablation in past, COPD not on home oxygen, ongoing smoking, diabetes  mellitus, hypertension, seizure disorder, hyperlipidemia, skin cancer and arthritis who is wheelchair-bound/walker bound at baseline presents to hospital secondary to left-sided weakness that started this morning.  #1 Acute CVA- with new left sided weakness, tpa not given as h/o hemorrhagic stroke in the past year - admit, neuro checks, neuro consult - lipid pnael, a1c - continue aggrenox for now - MRI, MRA, carotid dopplers and ECHO pending - might need anti coagulation since has Afib once CVA confirmed- but recent h/o fall and h/o hemorrhagic infarct in past. Await neuro input - PT, OT, speech therapy consults requested  #2 CAD-appears stable. Continue cardiac medications.  #3 history of seizure disorder-on Keppra  #4 diabetes mellitus-check A1c. On oral medications at home which will be continued. -On sliding scale insulin.  #5 chronic arthritis-continue Cymbalta, gabapentin and pain medications as needed.  #6 tobacco use disorder-strongly counseled against smoking. Started on nicotine patch.  #7 DVT prophylaxis-on Lovenox  #8 COPD- added nebs, wheezing on exam. If no improvement- add steroids tomorrow -O2 support as needed. Continue home inhalers    All the records are reviewed and case discussed with ED provider. Management plans discussed with the patient, family and they are in agreement.  CODE STATUS: Full Code  TOTAL TIME TAKING CARE OF THIS PATIENT: 50 minutes.    Samantha Aguirre M.D on 02/19/2016 at 1:11 PM  Between 7am to 6pm - Pager - [phone]  After 6pm go to [email or web address] - Proofreader  [facility]  Office  [phone]  CC: Primary care physician; Nobie Putnam, DO

## 2016-02-19 NOTE — ED Notes (Signed)
Pt with some episodes of bradycardia, HR in the 40's, pt asymptomatic otherwise.  MD notified.  Pt placed on zoll.

## 2016-02-20 ENCOUNTER — Inpatient Hospital Stay (HOSPITAL_COMMUNITY)
Admit: 2016-02-20 | Discharge: 2016-02-20 | Disposition: A | Discharge status: Home / Self Care | Payer: Medicare Other | Attending: Internal Medicine | Admitting: Internal Medicine

## 2016-02-20 ENCOUNTER — Inpatient Hospital Stay: Payer: Medicare Other

## 2016-02-20 DIAGNOSIS — I639 Cerebral infarction, unspecified: Secondary | ICD-10-CM

## 2016-02-20 LAB — GLUCOSE, CAPILLARY
GLUCOSE-CAPILLARY: 128 mg/dL — AB (ref 65–99)
GLUCOSE-CAPILLARY: 81 mg/dL (ref 65–99)
Glucose-Capillary: 126 mg/dL — ABNORMAL HIGH (ref 65–99)
Glucose-Capillary: 56 mg/dL — ABNORMAL LOW (ref 65–99)
Glucose-Capillary: 105 mg/dL — ABNORMAL HIGH (ref 65–99)

## 2016-02-20 LAB — BASIC METABOLIC PANEL
Anion gap: 8 (ref 5–15)
BUN: 18 mg/dL (ref 6–20)
CALCIUM: 8.8 mg/dL — AB (ref 8.9–10.3)
CO2: 22 mmol/L (ref 22–32)
CREATININE: 0.85 mg/dL (ref 0.44–1.00)
Chloride: 108 mmol/L (ref 101–111)
GFR calc non Af Amer: >60 mL/min (ref >60)
Glucose, Bld: 128 mg/dL — ABNORMAL HIGH (ref 65–99)
Potassium: 3.6 mmol/L (ref 3.5–5.1)
SODIUM: 138 mmol/L (ref 135–145)

## 2016-02-20 LAB — CBC
HCT: 32.5 % — ABNORMAL LOW (ref 35.0–47.0)
Hemoglobin: 11.2 g/dL — ABNORMAL LOW (ref 12.0–16.0)
MCH: 30.5 pg (ref 26.0–34.0)
MCHC: 34.6 g/dL (ref 32.0–36.0)
MCV: 88.2 fL (ref 80.0–100.0)
PLATELETS: 190 10*3/uL (ref 150–440)
RBC: 3.68 MIL/uL — AB (ref 3.80–5.20)
RDW: 17.3 % — AB (ref 11.5–14.5)
WBC: 7.2 10*3/uL (ref 3.6–11.0)

## 2016-02-20 LAB — HEMOGLOBIN A1C
Hgb A1c MFr Bld: 7 % — ABNORMAL HIGH (ref 4.8–5.6)
MEAN PLASMA GLUCOSE: 154 mg/dL

## 2016-02-20 MED ORDER — IPRATROPIUM-ALBUTEROL 0.5-2.5 (3) MG/3ML IN SOLN: 3.0000 mL | RESPIRATORY_TRACT | Status: DC | PRN

## 2016-02-20 MED ORDER — ATORVASTATIN CALCIUM 20 MG PO TABS
80.0000 mg | ORAL_TABLET | Freq: Every day | ORAL | Status: DC
  Administered 2016-02-20 – 2016-02-21 (×2): 80 mg via ORAL
  Filled 2016-02-20 (×2): qty 4

## 2016-02-20 MED ORDER — ATORVASTATIN CALCIUM 80 MG PO TABS: 80.0000 mg | ORAL_TABLET | Freq: Every day | ORAL | 0 refills | Status: DC

## 2016-02-20 MED ORDER — PERFLUTREN LIPID MICROSPHERE
1.0000 mL | INTRAVENOUS | Status: AC | PRN
  Administered 2016-02-20: 2 mL via INTRAVENOUS
  Filled 2016-02-20: qty 10

## 2016-02-20 MED ORDER — KETOROLAC TROMETHAMINE 15 MG/ML IJ SOLN
15.0000 mg | Freq: Once | INTRAMUSCULAR | Status: AC
  Administered 2016-02-20: 06:00:00 15 mg via INTRAVENOUS
  Filled 2016-02-20: qty 1

## 2016-02-20 MED ORDER — IPRATROPIUM-ALBUTEROL 0.5-2.5 (3) MG/3ML IN SOLN
3.0000 mL | Freq: Four times a day (QID) | RESPIRATORY_TRACT | Status: DC
  Administered 2016-02-20 – 2016-02-21 (×3): 3 mL via RESPIRATORY_TRACT
  Filled 2016-02-20 (×3): qty 3

## 2016-02-20 NOTE — Clinical Social Work Note (Signed)
CSW received referral for SNF.  Case discussed with case manager and plan is to discharge home with home health.  CSW to sign off please re-consult if social work needs arise.  Rosealee Recinos R. Dajon Rowe, MSW, LCSWA [phone]   

## 2016-02-20 NOTE — Care Management Important Message (Signed)
Important Message  Patient Details  Name: Samantha Aguirre MRN: [id number] Date of Birth: October 01, 1945   Medicare Important Message Given:  Yes  Initial signed IM printed from Epic and given to patient.     Katrina Stack, RN 02/20/2016, 11:18 AM

## 2016-02-20 NOTE — Progress Notes (Signed)
*  PRELIMINARY RESULTS* Echocardiogram 2D Echocardiogram has been performed.  Sherrie Sport 02/20/2016, 8:48 AM

## 2016-02-20 NOTE — Evaluation (Signed)
Physical Therapy Evaluation Patient Details Name: Samantha Aguirre MRN: [id number] DOB: Apr 03, 1945 Today's Date: 02/20/2016   History of Present Illness  Samantha Aguirre  is a 71 y.o. female with a known history of CAD s/p stents, h/o tachycardia s/p ablation in past, COPD not on home oxygen, ongoing smoking, diabetes mellitus, hypertension, seizure disorder, hyperlipidemia, skin cancer and arthritis who is wheelchair-bound/walker bound at baseline presents to hospital secondary to left-sided weakness that started this morning.new onset A. fib today. Has had prior strokes and mini strokes with right-sided symptoms. Her right side has almost recovered completely. She has weakness from arthritis and has a wheelchair and walker at baseline.. This morning she started having left arm and leg weakness, worse in the lower extremity that started about 3 hours from presentation. couldnt give TPA as h/o hemorrhagic strokes. CT of the head did not show  any acute findings. She has been on Aggrenox prior to admission. Also noted to have some slurred speech with mild left facial droop which is new according to patient. Denies any other tingling, numbness or vision changes. No nausea vomiting or diarrhea. Has been complaining of cough and shortness of breath for the last few days. Has significant wheezing on exam as well. She had xrays of L thumb which indicated a fracture but no intervention.  She has healing facial fractures and did not have any xrays of her L knee that has piain 10/10.  Clinical Impression  Patient reports having a fall on 01/11/2016, she sustained multiple fractures during this fall. Today she presents with L sided weakness complaints with concern of CVA, MRI was negative. She does report 10/10 L knee pain since the fall in December. She has been weightbearing since that time, denies other new falls. It sounds like she typically ambulates with 4WW around her mobile home, she makes it sound like she  has had multiple falls in the past. In this session she is very difficult to read, she is able to move spontaneously and bear weight, though continues to report 10/10 pain. She does have multiple buckling episodes while weightbearing and reports pain as increasing with weightbearing. Her previous x-rays in 11/13/2015 indicate old fx on L medial tibia area where she is complaining of pain currently. Given the above, would recommend further examination of L knee as she thinks she may have fallen on it, and it is severely hampering her safety with ambulation as she now appears to be an imminent falls risk. Would recommend SNF placement at discharge currently given her decreased safety with ambulation.     Follow Up Recommendations SNF    Equipment Recommendations       Recommendations for Other Services       Precautions / Restrictions Precautions Precautions: Fall Precaution Comments: L thumb fracture that did not rec any intervention after fall 12/29 with xrays on 1/9; pain in L knee 10/10 -- no imaging in system or taken per patient Restrictions Weight Bearing Restrictions: No      Mobility  Bed Mobility Overal bed mobility: Needs Assistance Bed Mobility: Supine to Sit;Sit to Supine     Supine to sit: Min guard Sit to supine: Min guard   General bed mobility comments: Patient is able to bridge (though painful) and bring her LEs off the edge of the bed, no assistance required for bringing her LEs back onto bed surface.   Transfers Overall transfer level: Needs assistance Equipment used: Rolling walker (2 wheeled) Transfers: Sit to/from Stand Sit to Stand: Mod  assist         General transfer comment: Patient makes several attempts to stand from bed surface, unable to complete without PT mod A x1.   Ambulation/Gait Ambulation/Gait assistance: Min assist Ambulation Distance (Feet): 15 Feet Assistive device: Rolling walker (2 wheeled)     Gait velocity interpretation: <1.8  ft/sec, indicative of risk for recurrent falls General Gait Details: Patient has several episodes of near buckling on LLE secondary to pain/weakness. No overt buckles, but several near buckles.   Stairs            Wheelchair Mobility    Modified Rankin (Stroke Patients Only)       Balance Overall balance assessment: Needs assistance Sitting-balance support: No upper extremity supported Sitting balance-Leahy Scale: Good     Standing balance support: Bilateral upper extremity supported Standing balance-Leahy Scale: Poor                               Pertinent Vitals/Pain Pain Assessment: 0-10 Pain Score: 10-Worst pain ever Pain Location: L knee  and occasional pain in L thumb when squeezing Pain Descriptors / Indicators: Discomfort;Sore;Nagging Pain Intervention(s): Limited activity within patient's tolerance;Monitored during session (Paged MD, called RN to discuss concerns of fx after fall)    Home Living Family/patient expects to be discharged to:: Private residence Living Arrangements: Children Available Help at Discharge: Family;Available PRN/intermittently Type of Home: Mobile home Home Access: Stairs to enter Entrance Stairs-Rails: None Entrance Stairs-Number of Steps: 2 Home Layout: One level Home Equipment: Walker - 4 wheels      Prior Function Level of Independence: Needs assistance      ADL's / Homemaking Assistance Needed: Can get dressed on her own with occasional assist for LB dressing when feeling weak; daughter does cooking and cleaning  Comments: Furniture walker without assist device at baseline; RW for longer (but limited) community distances. Does not drive. Gets assist at times for bathing. Does endorse multiple fall history, at least 5 in previous six months (unable to relate cause, "just end up on the floor")     Hand Dominance   Dominant Hand: Right    Extremity/Trunk Assessment   Upper Extremity Assessment Upper  Extremity Assessment: LUE deficits/detail LUE Deficits / Details: Decreased thumb opposition and strength due to recent fracture (no intervention rec), strength 2/5 LUE and hand with numbness and tingling LUE Sensation: decreased light touch LUE Coordination: decreased fine motor    Lower Extremity Assessment Lower Extremity Assessment: LLE deficits/detail LLE Deficits / Details: Hip flexion < 3/5, knee extension < 3/5 secondary to pain/weakness. Tender around medial tibial plateau. No obvious joint swelling, though patient reports swelling.        Communication   Communication: No difficulties  Cognition Arousal/Alertness: Awake/alert Behavior During Therapy: WFL for tasks assessed/performed Overall Cognitive Status: Within Functional Limits for tasks assessed                      General Comments General comments (skin integrity, edema, etc.): No obvious swelling around L knee, though patient reports feeling swelling in the knee.     Exercises     Assessment/Plan    PT Assessment Patient needs continued PT services  PT Problem List Decreased strength;Decreased mobility;Pain;Decreased balance;Decreased activity tolerance          PT Treatment Interventions DME instruction;Therapeutic activities;Therapeutic exercise;Gait training;Patient/family education;Balance training;Stair training;Neuromuscular re-education    PT Goals (Current goals can be  found in the Care Plan section)  Acute Rehab PT Goals Patient Stated Goal: To return home safely  PT Goal Formulation: With patient Time For Goal Achievement: 03/05/16 Potential to Achieve Goals: Good    Frequency Min 2X/week   Barriers to discharge Inaccessible home environment Patient has steps and is at imminent falls risk.     Co-evaluation               End of Session Equipment Utilized During Treatment: Gait belt Activity Tolerance: Patient limited by pain Patient left: in bed;with bed alarm set;with  call bell/phone within reach Nurse Communication: Mobility status (Concern of L knee pathology)         Time: [id number] PT Time Calculation (min) (ACUTE ONLY): 26 min   Charges:   PT Evaluation $PT Eval Moderate Complexity: 1 Procedure     PT G Codes:       Royce Macadamia PT, DPT, CSCS    02/20/2016, 1:16 PM

## 2016-02-20 NOTE — Discharge Instructions (Signed)
Heart healthy and ADA diet. °Smoking cessation. °

## 2016-02-20 NOTE — Care Management (Signed)
Physical therapy recommended skilled nursing.  Patient is medically stable for discharge and would not qualify for medicare covered SNF.  [facility] spoke with patient and she is agreeable to make herself available for home visits.  She will discharge home with home health services.  She ruled out for acute cva

## 2016-02-20 NOTE — Care Management (Signed)
Admitted with sx concerning for cva.  PT OT and SLP consult pending.  Patient has access to a walker and wheelchair.  Lives with her daughter. Now has a pcp Dr Nobie Putnam.  No issues obtaining medications.  Has had [facility] home health in the past. [facility] received a referral but was not able to locate the patient. Patient says that her trailor is not big enough to have home health.  eight people are currently living in the mobile home.  She is reluctant to agree to home health due to this .  Discussed the benefits of home safety eval.  She is agreeable to PT, SN and social work.  Patient is not able to navigate the system to find subsidized housing for her self. Asks to call [facility].  [facility] is checking to see if can accept patient due to not being able to locate patient last referral.  Patient has confirm address and phone number. Says phones are in service

## 2016-02-20 NOTE — Evaluation (Signed)
Clinical/Bedside Swallow Evaluation Patient Details  Name: Joclyn Delion MRN: [id number] Date of Birth: Apr 15, 1945  Today's Date: 02/20/2016 Time: SLP Start Time (ACUTE ONLY): 42 SLP Stop Time (ACUTE ONLY): 1330 SLP Time Calculation (min) (ACUTE ONLY): 60 min  Past Medical History:  Past Medical History:  Diagnosis Date  . Acid reflux   . Arthritis    Bilateral knees, hands, feet  . Asthma   . Cancer ([place])   . CHF (congestive heart failure) ([facility])    Diastolic CHF  . COPD (chronic obstructive pulmonary disease) ([place])   . Coronary artery disease   . Diabetes mellitus without complication ([place])   . Frequent headaches   . Heart attack   . High cholesterol   . HLD (hyperlipidemia)   . Hypertension   . MI (myocardial infarction)   . Seizures ([place])   . Skin cancer 2015   Suspected basal cell carcinoma on nose, surgically removed  . Stroke ([place])   . Thyroid disease   . Tremors of nervous system    Past Surgical History:  Past Surgical History:  Procedure Laterality Date  . ABDOMINAL HYSTERECTOMY  1976  . APPENDECTOMY  1980  . CAROTID ENDARTERECTOMY    . CHOLECYSTECTOMY  1980  . [place]  . KNEE SURGERY     left  . ROTATOR CUFF REPAIR     right  . TONSILLECTOMY  1950   HPI:  Pt  is a 71 y.o. female with a known history of CAD s/p stents, h/o tachycardia s/p ablation in past, COPD not on home oxygen, ongoing smoking, diabetes mellitus, hypertension, seizure disorder, hyperlipidemia, skin cancer and arthritis who is wheelchair-bound/walker bound at baseline presents to hospital secondary to left-sided weakness that started this morning. Currently pt is able to communicate wants and needs; able to carry on full conversation with no unintelligable speech. Pt reports being able to communicate everything she needs to. Pt also does not report any swallowing difficulty- although she does state she sometimes gets choked on saliava.    Assessment / Plan /  Recommendation Clinical Impression  Pt appears to present at reduced risk for aspiration following general aspiration precuations with regular foods and thin liquids via straw. Pt seen with lunchtime meal of grilled cheese, soup and applesauce. Pt did not appear to present with any s/sx of aspiration with trials of thin liquids or regular foods (no change is respiratory status) Upon enterance, pt did present with cough before any po trials given. Pt did cough mildly intermitently; it did not appear to increase in frequency or severity as po trials continued. Pt did report not feeling very hungry with lunchtime meal, but did eat grilled cheese and applesauce and consumed mulitple sips of water and soda via straw with no apperant deficits noted. No oral phase deficits noted (no anterior spillage, adequate control of bolus). Pt did report mild soreness on Right side occuring after facial fratures from fall last week. However, after oral mech exam, SLP did not note any significant deficits. Do not anticipate any further skilled ST services needed this date as speech and swallowing appear to be close to/at her baseline - pt conversing at conversational level and answering questions with staff members with no apperant deficits noted (vocal quality is gravely baseline). Also noted MRI results were negative. NSG to re-consult if any change in status noted. Discuss with pt the use of general aspiration precautions with any oral intake with meals, pt agreed.     Aspiration  Risk   (reduced)    Diet Recommendation   Regular diet (meats cut for ease per pt request), thin liquids. Following general aspiration precautions, reflux precautions.   Medication Administration: Whole meds with liquid (with puree if needed)    Other  Recommendations Recommended Consults:  (dietitican ) Oral Care Recommendations: Oral care BID;Patient independent with oral care   Follow up Recommendations Home health SLP (TBD)       Frequency and Duration  n/a         Prognosis Prognosis for Safe Diet Advancement: Good      Swallow Study   General Date of Onset: 02/19/16 HPI: Pt  is a 71 y.o. female with a known history of CAD s/p stents, h/o tachycardia s/p ablation in past, COPD not on home oxygen, ongoing smoking, diabetes mellitus, hypertension, seizure disorder, hyperlipidemia, skin cancer and arthritis who is wheelchair-bound/walker bound at baseline presents to hospital secondary to left-sided weakness that started this morning. Currently pt is able to communicate wants and needs; able to carry on full conversation with no unintelligable speech. Pt reports being able to communicate everything she needs to. Pt also does not report any swallowing difficulty- although she does state she sometimes gets choked on saliava.  Type of Study: Bedside Swallow Evaluation Previous Swallow Assessment: none noted Diet Prior to this Study: Regular;Thin liquids Temperature Spikes Noted: No Respiratory Status: Room air History of Recent Intubation: No Behavior/Cognition: Alert;Cooperative;Pleasant mood (a bit negative attitude overall) Oral Cavity Assessment: Within Functional Limits Oral Care Completed by SLP: Recent completion by staff Oral Cavity - Dentition: Missing dentition;Poor condition Vision: Functional for self-feeding Self-Feeding Abilities: Able to feed self;Needs set up Patient Positioning: Upright in bed;Postural control adequate for testing Baseline Vocal Quality: Hoarse (Gravely) Volitional Cough: Strong Volitional Swallow: Able to elicit    Oral/Motor/Sensory Function Overall Oral Motor/Sensory Function: Within functional limits (mild right-sided tounge weakness since face fratures)   Ice Chips Ice chips: Not tested   Thin Liquid Thin Liquid: Within functional limits Presentation: Cup;Straw;Self Fed (X4 trials with mulitple swallows ) Other Comments: Pt does have baseline cough before any po trials. Pt  had cough X1 after swallow     Nectar Thick Nectar Thick Liquid: Not tested   Honey Thick Honey Thick Liquid: Not tested   Puree Puree: Within functional limits Presentation: Self Fed;Spoon (X10+ trials)   Solid   GO   Solid: Within functional limits Presentation: Self Fed (X10+ trials, softened solid of grilled cheese) Other Comments: Pt was seen with lunchtime meal        Eulogio Ditch, B.S Graduate Clinician  02/20/2016,2:21 PM   Orinda Kenner, [place], CCC-SLP

## 2016-02-20 NOTE — Evaluation (Signed)
Occupational Therapy Evaluation Patient Details Name: Samantha Aguirre MRN: [id number] DOB: 19-Nov-1945 Today's Date: 02/20/2016    History of Present Illness Samantha Aguirre  is a 71 y.o. female with a known history of CAD s/p stents, h/o tachycardia s/p ablation in past, COPD not on home oxygen, ongoing smoking, diabetes mellitus, hypertension, seizure disorder, hyperlipidemia, skin cancer and arthritis who is wheelchair-bound/walker bound at baseline presents to hospital secondary to left-sided weakness that started this morning.new onset A. fib today. Has had prior strokes and mini strokes with right-sided symptoms. Her right side has almost recovered completely. She has weakness from arthritis and has a wheelchair and walker at baseline.. This morning she started having left arm and leg weakness, worse in the lower extremity that started about 3 hours from presentation. couldnt give TPA as h/o hemorrhagic strokes. CT of the head did not show  any acute findings. She has been on Aggrenox prior to admission. Also noted to have some slurred speech with mild left facial droop which is new according to patient. Denies any other tingling, numbness or vision changes. No nausea vomiting or diarrhea. Has been complaining of cough and shortness of breath for the last few days. Has significant wheezing on exam as well. She had xrays of L thumb which indicated a fracture but no intervention.  She has healing facial fractures and did not have any xrays of her L knee that has piain 10/10.   Clinical Impression   Pt. Is a 71 y.o. female who was admitted for above diagnosis with main complaint of L sided weakness but CT was negative. Pt. presents with decreased strength, decreased functional hand use, pain in L thumb due to hx of fracture after fall 12/29 with xrays on 1/9 and no intervention rec and decreased functional mobility which hinder her ability to complete ADL and IADL tasks. Patient could benefit from  skilled OT services to work on LUE neuro muscular re-ed, therapeutic exercises, positioning, and pt./family education.  Pain is 10/10 in L knee and OOB activities deferred until patient was seen by PT (Pt to discuss with MD about pain and rec for further assessment). Patient would be a good candidate to continue OT services at SNF due to safety concerns.    Follow Up Recommendations  SNF    Equipment Recommendations  3 in 1 bedside commode;Other (comment) (rec shower chair but pt says it wont fit in old tub)    Recommendations for Other Services       Precautions / Restrictions Precautions Precautions: Fall Precaution Comments: L thumb fracture that did not rec any intervention after fall 12/29 with xrays on 1/9; pain in L knee 10/10 Restrictions Weight Bearing Restrictions: No      Mobility Bed Mobility                  Transfers                      Balance                                            ADL Overall ADL's : Needs assistance/impaired Eating/Feeding: Set up;Independent   Grooming: Wash/dry hands;Wash/dry face;Oral care;Brushing hair;Minimal assistance;Set up           Upper Body Dressing : Minimal assistance;Set up   Lower Body Dressing: Set up;Minimal assistance Lower Body  Dressing Details (indicate cue type and reason): due to pain in L knee; deferred OOB or EOB until PT assessed patient                     Vision     Perception     Praxis      Pertinent Vitals/Pain Pain Assessment: 0-10 Pain Score: 10-Worst pain ever Pain Location: L knee  and occasional pain in L thumb when squeezing Pain Descriptors / Indicators: Discomfort;Sore;Nagging Pain Intervention(s): Limited activity within patient's tolerance;Monitored during session;Premedicated before session     Hand Dominance Right   Extremity/Trunk Assessment Upper Extremity Assessment Upper Extremity Assessment: LUE deficits/detail LUE Deficits /  Details: Decreased thumb opposition and strength due to recent fracture (no intervention rec), strength 2/5 LUE and hand with numbness and tingling LUE Sensation: decreased light touch LUE Coordination: decreased fine motor   Lower Extremity Assessment Lower Extremity Assessment: Defer to PT evaluation       Communication Communication Communication: No difficulties   Cognition Arousal/Alertness: Awake/alert Behavior During Therapy: WFL for tasks assessed/performed Overall Cognitive Status: Within Functional Limits for tasks assessed                     General Comments       Exercises       Shoulder Instructions      Home Living Family/patient expects to be discharged to:: Private residence Living Arrangements: Children Available Help at Discharge: Family;Available PRN/intermittently Type of Home: Mobile home Home Access: Stairs to enter Entrance Stairs-Number of Steps: 2 Entrance Stairs-Rails: None Home Layout: One level     Bathroom Shower/Tub: Tub/shower unit (old tub and no room for shower chair or bench due to height) Shower/tub characteristics: Curtain Biochemist, clinical: Standard     Home Equipment: Environmental consultant - 4 wheels          Prior Functioning/Environment Level of Independence: Needs assistance    ADL's / Homemaking Assistance Needed: Can get dressed on her own with occasional assist for LB dressing when feeling weak; daughter does cooking and cleaning   Comments: Furniture walker without assist device at baseline; RW for longer (but limited) community distances. Does not drive. Gets assist at times for bathing. Does endorse multiple fall history, at least 5 in previous six months (unable to relate cause, "just end up on the floor")        OT Problem List: Decreased strength;Decreased activity tolerance;Decreased coordination;Impaired UE functional use;Pain;Impaired balance (sitting and/or standing)   OT Treatment/Interventions: Self-care/ADL  training;Patient/family education;Energy conservation;Therapeutic activities;Balance training    OT Goals(Current goals can be found in the care plan section) Acute Rehab OT Goals Patient Stated Goal: "to get my strength back" OT Goal Formulation: With patient Time For Goal Achievement: 03/05/16 Potential to Achieve Goals: Good ADL Goals Pt Will Perform Lower Body Dressing: with set-up;with min assist;sit to/from stand Pt Will Transfer to Toilet: with min assist;stand pivot transfer;regular height toilet (using FWW and no LOB)  OT Frequency: Min 1X/week   Barriers to D/C:            Co-evaluation              End of Session    Activity Tolerance: Patient tolerated treatment well Patient left: in bed;with call bell/phone within reach;Other (comment) (PT in room for eval)   Time: [id number] OT Time Calculation (min): 39 min Charges:  OT General Charges $OT Visit: 1 Procedure OT Evaluation $OT Eval Moderate Complexity: 1 Procedure  OT Treatments $Self Care/Home Management : 23-37 mins G-Codes:     Chrys Racer, OTR/L 02/20/16, 11:48 AM

## 2016-02-20 NOTE — Discharge Summary (Signed)
[facility] NAME: Samantha Aguirre    MR#:  [id number]  DATE OF BIRTH:  28-Feb-1945  DATE OF ADMISSION:  02/19/2016   ADMITTING PHYSICIAN: Gladstone Lighter, MD  DATE OF DISCHARGE: 02/20/2016 PRIMARY CARE PHYSICIAN: Nobie Putnam, DO   ADMISSION DIAGNOSIS:  Cerebral infarction ([place]) [I63.9] CVA (cerebral infarction) [I63.9] Cerebrovascular accident (CVA), unspecified mechanism ([place]) [I63.9] DISCHARGE DIAGNOSIS:  Active Problems:   CVA (cerebral vascular accident) ([place]) Left-sided weakness. SECONDARY DIAGNOSIS:   Past Medical History:  Diagnosis Date  . Acid reflux   . Arthritis    Bilateral knees, hands, feet  . Asthma   . Cancer ([place])   . CHF (congestive heart failure) ([facility])    Diastolic CHF  . COPD (chronic obstructive pulmonary disease) ([place])   . Coronary artery disease   . Diabetes mellitus without complication ([place])   . Frequent headaches   . Heart attack   . High cholesterol   . HLD (hyperlipidemia)   . Hypertension   . MI (myocardial infarction)   . Seizures ([place])   . Skin cancer 2015   Suspected basal cell carcinoma on nose, surgically removed  . Stroke ([place])   . Thyroid disease   . Tremors of nervous system    HOSPITAL COURSE:  Samantha Aguirre  is a 71 y.o. female with a known history of CAD s/p stents, h/o tachycardia s/p ablation in past, COPD not on home oxygen, ongoing smoking, diabetes mellitus, hypertension, seizure disorder, hyperlipidemia, skin cancer and arthritis who is wheelchair-bound/walker bound at baseline presents to hospital secondary to left-sided weakness that started this morning.  #1 Left sided weakness, tpa not given as h/o hemorrhagic stroke in the past year No Acute CVA per MRI Brain. - lipid pnael, a1c - continue aggrenox, change to lipitor 80 mg po HS.  carotid dopplers is unremarkable and ECHO: Systolic function was normal. The estimated ejection fraction was in the  range of 60% to 65%. - might need anti coagulation since has Afib once CVA confirmed- but recent h/o fall and h/o hemorrhagic infarct in past.  PT evaluation suggested skilled nursing facility placement. But per case manager and social worker, the patient did need to stay in the hospital for 3 days due to recurrence, however the patient is medically stable and there is no need to stay in the hospital for 3 days. The patient will be discharged to home with home health and PT and social worker.   #2 CAD-appears stable. Continue cardiac medications.  #3 history of seizure disorder-on Keppra  #4 diabetes mellitus-check A1c. On oral medications at home which will be continued. -On sliding scale insulin.  #5 chronic arthritis-continue Cymbalta, gabapentin and pain medications as needed. Left knee x-ray show chronic changes.  #6 tobacco use disorder-strongly counseled against smoking. Started on nicotine patch.  #7 hyperlipidemia. LDL 302, change to Lipitor 80 mg by mouth at bedtime.  #8 COPD- stable, on nebs.  DISCHARGE CONDITIONS:  Gen., discharge to home with home health and PT today. CONSULTS OBTAINED:  Treatment Team:  Catarina Hartshorn, MD Alexis Goodell, MD DRUG ALLERGIES:   Allergies  Allergen Reactions  . Mushroom Extract Complex Anaphylaxis    Made patient "deathly sick"  . Atenolol     "MD took me off of it bc it was doing something wrong" Made patient HYPOTENSIVE  . Ivp Dye [Iodinated Diagnostic Agents] Itching    Pt injected with IV contrast.  5 min after injection pt  complained of itching behind ear and on her abd.  . Morphine And Related Other (See Comments)    "stops my breathing" NEAR-RESPIRATORY ARREST  . Percocet [Oxycodone-Acetaminophen] Nausea And Vomiting and Other (See Comments)    Stomach pains  . Percodan [Oxycodone-Aspirin] Nausea And Vomiting and Other (See Comments)    Stomach pains  . Verapamil Other (See Comments)    " MD told me this was  screwing me up" MAKES PATIENT HYPOTENSIVE   DISCHARGE MEDICATIONS:   Allergies as of 02/20/2016      Reactions   Mushroom Extract Complex Anaphylaxis   Made patient "deathly sick"   Atenolol    "MD took me off of it bc it was doing something wrong" Made patient HYPOTENSIVE   Ivp Dye [iodinated Diagnostic Agents] Itching   Pt injected with IV contrast.  5 min after injection pt complained of itching behind ear and on her abd.   Morphine And Related Other (See Comments)   "stops my breathing" NEAR-RESPIRATORY ARREST   Percocet [oxycodone-acetaminophen] Nausea And Vomiting, Other (See Comments)   Stomach pains   Percodan [oxycodone-aspirin] Nausea And Vomiting, Other (See Comments)   Stomach pains   Verapamil Other (See Comments)   " MD told me this was screwing me up" MAKES PATIENT HYPOTENSIVE      Medication List    STOP taking these medications   predniSONE 10 MG tablet Commonly known as:  DELTASONE   rosuvastatin 40 MG tablet Commonly known as:  CRESTOR     TAKE these medications   albuterol 108 (90 Base) MCG/ACT inhaler Commonly known as:  PROVENTIL HFA;VENTOLIN HFA Inhale 2 puffs into the lungs every 6 (six) hours as needed for wheezing or shortness of breath.   atorvastatin 80 MG tablet Commonly known as:  LIPITOR Take 1 tablet (80 mg total) by mouth daily at 6 PM.   baclofen 10 MG tablet Commonly known as:  LIORESAL Take 0.5-1 tablets (5-10 mg total) by mouth 3 (three) times daily as needed for muscle spasms.   diclofenac sodium 1 % Gel Commonly known as:  VOLTAREN Apply 2 g topically 4 (four) times daily.   dipyridamole-aspirin 200-25 MG 12hr capsule Commonly known as:  AGGRENOX Take 1 capsule by mouth 2 (two) times daily.   DULoxetine 60 MG capsule Commonly known as:  CYMBALTA Take 1 capsule (60 mg total) by mouth at bedtime.   fluticasone 50 MCG/ACT nasal spray Commonly known as:  FLONASE Place 1 spray into both nostrils daily.   fluticasone  furoate-vilanterol 100-25 MCG/INH Aepb Commonly known as:  BREO ELLIPTA Inhale 1 puff into the lungs daily.   gabapentin 800 MG tablet Commonly known as:  NEURONTIN Take 1 tablet (800 mg total) by mouth 2 (two) times daily.   glipiZIDE-metformin 5-500 MG tablet Commonly known as:  METAGLIP Take 1-2 tablets by mouth See admin instructions. 2 tablets in the morning and 1 tablet at night   guaiFENesin 600 MG 12 hr tablet Commonly known as:  MUCINEX Take 1 tablet (600 mg total) by mouth 2 (two) times daily as needed for cough or to loosen phlegm.   levETIRAcetam 500 MG tablet Commonly known as:  KEPPRA Take 1 tablet (500 mg total) by mouth 2 (two) times daily.   levothyroxine 125 MCG tablet Commonly known as:  SYNTHROID, LEVOTHROID Take 1 tablet (125 mcg total) by mouth every other day.   lidocaine 5 % Commonly known as:  LIDODERM Place 1 patch onto the skin daily.  lisinopril 20 MG tablet Commonly known as:  PRINIVIL,ZESTRIL Take 1 tablet (20 mg total) by mouth daily.   TYLENOL 325 MG tablet Generic drug:  acetaminophen Take 650 mg by mouth at bedtime.   umeclidinium bromide 62.5 MCG/INH Aepb Commonly known as:  INCRUSE ELLIPTA Inhale 1 puff into the lungs daily.        DISCHARGE INSTRUCTIONS:  See AVS.  If you experience worsening of your admission symptoms, develop shortness of breath, life threatening emergency, suicidal or homicidal thoughts you must seek medical attention immediately by calling 911 or calling your MD immediately  if symptoms less severe.  You Must read complete instructions/literature along with all the possible adverse reactions/side effects for all the Medicines you take and that have been prescribed to you. Take any new Medicines after you have completely understood and accpet all the possible adverse reactions/side effects.   Please note  You were cared for by a hospitalist during your hospital stay. If you have any questions about your  discharge medications or the care you received while you were in the hospital after you are discharged, you can call the unit and asked to speak with the hospitalist on call if the hospitalist that took care of you is not available. Once you are discharged, your primary care physician will handle any further medical issues. Please note that NO REFILLS for any discharge medications will be authorized once you are discharged, as it is imperative that you return to your primary care physician (or establish a relationship with a primary care physician if you do not have one) for your aftercare needs so that they can reassess your need for medications and monitor your lab values.    On the day of Discharge:  VITAL SIGNS:  Blood pressure 109/65, pulse (!) 57, temperature 98 F (36.7 C), temperature source Oral, resp. rate 18, height 5\' 1"  (1.549 m), weight 181 lb 6 oz (82.3 kg), SpO2 95 %. PHYSICAL EXAMINATION:  GENERAL:  71 y.o.-year-old patient lying in the bed with no acute distress.  EYES: Pupils equal, round, reactive to light and accommodation. No scleral icterus. Extraocular muscles intact.  HEENT: Head atraumatic, normocephalic. Oropharynx and nasopharynx clear.  NECK:  Supple, no jugular venous distention. No thyroid enlargement, no tenderness.  LUNGS: Normal breath sounds bilaterally, no wheezing, rales,rhonchi or crepitation. No use of accessory muscles of respiration.  CARDIOVASCULAR: S1, S2 normal. No murmurs, rubs, or gallops.  ABDOMEN: Soft, non-tender, non-distended. Bowel sounds present. No organomegaly or mass.  EXTREMITIES: No pedal edema, cyanosis, or clubbing. Tenderness on left knee, but no erythema. NEUROLOGIC: Cranial nerves II through XII are intact. Muscle strength 4/5 in all extremities except 3/5 on right leg. The patient cannot move left leg due to left knee pain. Sensation intact. Gait not checked.  PSYCHIATRIC: The patient is alert and oriented x 3.  SKIN: No obvious rash,  lesion, or ulcer.  DATA REVIEW:   CBC  Recent Labs Lab 02/20/16 0622  WBC 7.2  HGB 11.2*  HCT 32.5*  PLT 190    Chemistries   Recent Labs Lab 02/19/16 1140 02/20/16 0622  NA 138 138  K 3.4* 3.6  CL 104 108  CO2 26 22  GLUCOSE 113* 128*  BUN 15 18  CREATININE 0.82 0.85  CALCIUM 8.8* 8.8*  AST 25  --   ALT 13*  --   ALKPHOS 88  --   BILITOT 0.6  --      Microbiology Results  Results for  orders placed or performed during the hospital encounter of 08/16/15  Urine culture     Status: Abnormal   Collection Time: 08/16/15  3:17 PM  Result Value Ref Range Status   Specimen Description URINE, RANDOM  Final   Special Requests NONE  Final   Culture MULTIPLE SPECIES PRESENT, SUGGEST RECOLLECTION (A)  Final   Report Status 08/17/2015 FINAL  Final    RADIOLOGY:  US Carotid Bilateral  Result Date: 02/20/2016 CLINICAL DATA:  CVA. EXAM: BILATERAL CAROTID DUPLEX ULTRASOUND TECHNIQUE: Pearline Cables scale imaging, color Doppler and duplex ultrasound were performed of bilateral carotid and vertebral arteries in the neck. COMPARISON:  None. FINDINGS: Criteria: Quantification of carotid stenosis is based on velocity parameters that correlate the residual internal carotid diameter with NASCET-based stenosis levels, using the diameter of the distal internal carotid lumen as the denominator for stenosis measurement. The following velocity measurements were obtained: RIGHT ICA:  71 cm/sec CCA:  51 cm/sec SYSTOLIC ICA/CCA RATIO:  1.4 DIASTOLIC ICA/CCA RATIO:  1.4 ECA:  57 cm/sec LEFT ICA:  55 cm/sec CCA:  56 cm/sec SYSTOLIC ICA/CCA RATIO:  1.0 DIASTOLIC ICA/CCA RATIO:  1.4 ECA:  65 cm/sec RIGHT CAROTID ARTERY: Minimal plaque at the right carotid bulb. No significant carotid artery stenosis. External carotid artery is patent with normal waveform. Normal waveforms and velocities in the internal carotid artery. RIGHT VERTEBRAL ARTERY: Antegrade flow and normal waveform in the right vertebral artery. LEFT  CAROTID ARTERY: Scattered plaque in left common carotid artery. Mild plaque at the left carotid bulb. External carotid artery is patent with normal waveform. Normal waveforms and velocities in the internal carotid artery. LEFT VERTEBRAL ARTERY: Antegrade flow and normal waveform in the left vertebral artery. IMPRESSION: Mild atherosclerotic disease in the carotid arteries, left side greater than right. Estimated degree of stenosis in the internal carotid arteries is less than 50% bilaterally. Patent vertebral arteries with antegrade flow. Electronically Signed   By: Markus Daft M.D.   On: 02/20/2016 09:11   Dg Knee Complete 4 Views Left  Result Date: 02/20/2016 CLINICAL DATA:  Left knee pain EXAM: LEFT KNEE - COMPLETE 4+ VIEW COMPARISON:  11/13/2015 FINDINGS: Degenerative changes are noted with medial joint space narrowing. Mild irregularity of the lateral tibial plateau is noted but stable. This is consistent with prior lateral tibial plateau fracture with healing. No acute joint effusion is seen. No fracture is noted. IMPRESSION: Chronic changes without acute abnormality. Electronically Signed   By: Inez Catalina M.D.   On: 02/20/2016 14:20     Management plans discussed with the patient, family and they are in agreement.  CODE STATUS:     Code Status Orders        Start     Ordered   02/19/16 1553  Full code  Continuous     02/19/16 1553    Code Status History    Date Active Date Inactive Code Status Order ID Comments User Context   12/27/2015  1:09 PM 01/01/2016  6:33 PM DNR [id number]  Loletha Grayer, MD ED   09/16/2015  6:52 PM 09/19/2015  7:29 PM Full Code [id number]  Baxter Hire, MD Inpatient   08/16/2015  5:14 PM 08/19/2015  5:35 PM DNR [id number]  Willia Craze, NP Inpatient      TOTAL TIME TAKING CARE OF THIS PATIENT: 40 minutes.    Demetrios Loll M.D on 02/20/2016 at 5:31 PM  Between 7am to 6pm - Pager - [phone]  After 6pm go to [email or web address] - password  [email or web address]  [facility]  Office  [phone]  CC: Primary care physician; Nobie Putnam, DO   Note: This dictation was prepared with Dragon dictation along with smaller phrase technology. Any transcriptional errors that result from this process are unintentional.

## 2016-02-21 LAB — GLUCOSE, CAPILLARY
GLUCOSE-CAPILLARY: 95 mg/dL (ref 65–99)
Glucose-Capillary: 85 mg/dL (ref 65–99)

## 2016-02-21 MED ORDER — IPRATROPIUM-ALBUTEROL 0.5-2.5 (3) MG/3ML IN SOLN: 3.0000 mL | Freq: Four times a day (QID) | RESPIRATORY_TRACT | Status: DC | PRN

## 2016-02-21 NOTE — Discharge Summary (Signed)
Samantha Aguirre NAME: Samantha Aguirre    MR#:  [id number]  DATE OF BIRTH:  April 30, 1945  DATE OF ADMISSION:  02/19/2016   ADMITTING PHYSICIAN: Gladstone Lighter, MD  DATE OF DISCHARGE: 02/21/2016 PRIMARY CARE PHYSICIAN: Nobie Putnam, DO   ADMISSION DIAGNOSIS:  Cerebral infarction ([place]) [I63.9] CVA (cerebral infarction) [I63.9] Cerebrovascular accident (CVA), unspecified mechanism ([place]) [I63.9] DISCHARGE DIAGNOSIS:  Active Problems:   CVA (cerebral vascular accident) ([place]) Left-sided weakness. SECONDARY DIAGNOSIS:   Past Medical History:  Diagnosis Date  . Acid reflux   . Arthritis    Bilateral knees, hands, feet  . Asthma   . Cancer ([place])   . CHF (congestive heart failure) (H[facility])    Diastolic CHF  . COPD (chronic obstructive pulmonary disease) ([place])   . Coronary artery disease   . Diabetes mellitus without complication ([place])   . Frequent headaches   . Heart attack   . High cholesterol   . HLD (hyperlipidemia)   . Hypertension   . MI (myocardial infarction)   . Seizures ([place])   . Skin cancer 2015   Suspected basal cell carcinoma on nose, surgically removed  . Stroke ([place])   . Thyroid disease   . Tremors of nervous system    HOSPITAL COURSE:  Samantha Aguirre  is a 71 y.o. female with a known history of CAD s/p stents, h/o tachycardia s/p ablation in past, COPD not on home oxygen, ongoing smoking, diabetes mellitus, hypertension, seizure disorder, hyperlipidemia, skin cancer and arthritis who is wheelchair-bound/walker bound at baseline presents to hospital secondary to left-sided weakness that started this morning.  #1 Left sided weakness, tpa not given as h/o hemorrhagic stroke in the past year No Acute CVA per MRI Brain. - lipid pnael, a1c - continue aggrenox, changed to lipitor 80 mg po HS.  carotid dopplers is unremarkable and ECHO: Systolic function was normal. The estimated ejection fraction was in the  range of 60% to 65%. - might need anti coagulation since has Afib once CVA confirmed- but recent h/o fall and h/o hemorrhagic infarct in past.   Physical therapy recommended skilled nursing.  Patient is medically stable for discharge and would not qualify for medicare covered SNF.  The patient will be discharged to home with home health and PT and social worker.  #2 CAD-appears stable. Continue cardiac medications.  #3 history of seizure disorder-on Keppra  #4 diabetes mellitus-check A1c. On oral medications at home which will be continued. -On sliding scale insulin.  #5 chronic arthritis-continue Cymbalta, gabapentin and pain medications as needed. Left knee x-ray show chronic changes.  #6 tobacco use disorder-strongly counseled against smoking. Started on nicotine patch.  #7 hyperlipidemia. LDL 302, changed to Lipitor 80 mg by mouth at bedtime.  #8 COPD- stable, on nebs.  DISCHARGE CONDITIONS:  Gen., discharge to home with home health and PT today. CONSULTS OBTAINED:  Treatment Team:  Catarina Hartshorn, MD Alexis Goodell, MD DRUG ALLERGIES:   Allergies  Allergen Reactions  . Mushroom Extract Complex Anaphylaxis    Made patient "deathly sick"  . Atenolol     "MD took me off of it bc it was doing something wrong" Made patient HYPOTENSIVE  . Ivp Dye [Iodinated Diagnostic Agents] Itching    Pt injected with IV contrast.  5 min after injection pt complained of itching behind ear and on her abd.  . Morphine And Related Other (See Comments)    "stops my breathing" NEAR-RESPIRATORY ARREST  .  Percocet [Oxycodone-Acetaminophen] Nausea And Vomiting and Other (See Comments)    Stomach pains  . Percodan [Oxycodone-Aspirin] Nausea And Vomiting and Other (See Comments)    Stomach pains  . Verapamil Other (See Comments)    " MD told me this was screwing me up" MAKES PATIENT HYPOTENSIVE   DISCHARGE MEDICATIONS:   Allergies as of 02/21/2016      Reactions   Mushroom Extract  Complex Anaphylaxis   Made patient "deathly sick"   Atenolol    "MD took me off of it bc it was doing something wrong" Made patient HYPOTENSIVE   Ivp Dye [iodinated Diagnostic Agents] Itching   Pt injected with IV contrast.  5 min after injection pt complained of itching behind ear and on her abd.   Morphine And Related Other (See Comments)   "stops my breathing" NEAR-RESPIRATORY ARREST   Percocet [oxycodone-acetaminophen] Nausea And Vomiting, Other (See Comments)   Stomach pains   Percodan [oxycodone-aspirin] Nausea And Vomiting, Other (See Comments)   Stomach pains   Verapamil Other (See Comments)   " MD told me this was screwing me up" MAKES PATIENT HYPOTENSIVE      Medication List    STOP taking these medications   predniSONE 10 MG tablet Commonly known as:  DELTASONE   rosuvastatin 40 MG tablet Commonly known as:  CRESTOR     TAKE these medications   albuterol 108 (90 Base) MCG/ACT inhaler Commonly known as:  PROVENTIL HFA;VENTOLIN HFA Inhale 2 puffs into the lungs every 6 (six) hours as needed for wheezing or shortness of breath.   atorvastatin 80 MG tablet Commonly known as:  LIPITOR Take 1 tablet (80 mg total) by mouth daily at 6 PM.   baclofen 10 MG tablet Commonly known as:  LIORESAL Take 0.5-1 tablets (5-10 mg total) by mouth 3 (three) times daily as needed for muscle spasms.   diclofenac sodium 1 % Gel Commonly known as:  VOLTAREN Apply 2 g topically 4 (four) times daily.   dipyridamole-aspirin 200-25 MG 12hr capsule Commonly known as:  AGGRENOX Take 1 capsule by mouth 2 (two) times daily.   DULoxetine 60 MG capsule Commonly known as:  CYMBALTA Take 1 capsule (60 mg total) by mouth at bedtime.   fluticasone 50 MCG/ACT nasal spray Commonly known as:  FLONASE Place 1 spray into both nostrils daily.   fluticasone furoate-vilanterol 100-25 MCG/INH Aepb Commonly known as:  BREO ELLIPTA Inhale 1 puff into the lungs daily.   gabapentin 800 MG  tablet Commonly known as:  NEURONTIN Take 1 tablet (800 mg total) by mouth 2 (two) times daily.   glipiZIDE-metformin 5-500 MG tablet Commonly known as:  METAGLIP Take 1-2 tablets by mouth See admin instructions. 2 tablets in the morning and 1 tablet at night   guaiFENesin 600 MG 12 hr tablet Commonly known as:  MUCINEX Take 1 tablet (600 mg total) by mouth 2 (two) times daily as needed for cough or to loosen phlegm.   levETIRAcetam 500 MG tablet Commonly known as:  KEPPRA Take 1 tablet (500 mg total) by mouth 2 (two) times daily.   levothyroxine 125 MCG tablet Commonly known as:  SYNTHROID, LEVOTHROID Take 1 tablet (125 mcg total) by mouth every other day.   lidocaine 5 % Commonly known as:  LIDODERM Place 1 patch onto the skin daily.   lisinopril 20 MG tablet Commonly known as:  PRINIVIL,ZESTRIL Take 1 tablet (20 mg total) by mouth daily.   TYLENOL 325 MG tablet Generic drug:  acetaminophen Take 650 mg by mouth at bedtime.   umeclidinium bromide 62.5 MCG/INH Aepb Commonly known as:  INCRUSE ELLIPTA Inhale 1 puff into the lungs daily.        DISCHARGE INSTRUCTIONS:  See AVS.  If you experience worsening of your admission symptoms, develop shortness of breath, life threatening emergency, suicidal or homicidal thoughts you must seek medical attention immediately by calling 911 or calling your MD immediately  if symptoms less severe.  You Must read complete instructions/literature along with all the possible adverse reactions/side effects for all the Medicines you take and that have been prescribed to you. Take any new Medicines after you have completely understood and accpet all the possible adverse reactions/side effects.   Please note  You were cared for by a hospitalist during your hospital stay. If you have any questions about your discharge medications or the care you received while you were in the hospital after you are discharged, you can call the unit and asked  to speak with the hospitalist on call if the hospitalist that took care of you is not available. Once you are discharged, your primary care physician will handle any further medical issues. Please note that NO REFILLS for any discharge medications will be authorized once you are discharged, as it is imperative that you return to your primary care physician (or establish a relationship with a primary care physician if you do not have one) for your aftercare needs so that they can reassess your need for medications and monitor your lab values.    On the day of Discharge:  VITAL SIGNS:  Blood pressure 135/63, pulse (!) 51, temperature 97.5 F (36.4 C), temperature source Oral, resp. rate 18, height 5\' 1"  (1.549 m), weight 181 lb 6 oz (82.3 kg), SpO2 97 %. PHYSICAL EXAMINATION:  GENERAL:  71 y.o.-year-old patient lying in the bed with no acute distress.  EYES: Pupils equal, round, reactive to light and accommodation. No scleral icterus. Extraocular muscles intact.  HEENT: Head atraumatic, normocephalic. Oropharynx and nasopharynx clear.  NECK:  Supple, no jugular venous distention. No thyroid enlargement, no tenderness.  LUNGS: Normal breath sounds bilaterally, no wheezing, rales,rhonchi or crepitation. No use of accessory muscles of respiration.  CARDIOVASCULAR: S1, S2 normal. No murmurs, rubs, or gallops.  ABDOMEN: Soft, non-tender, non-distended. Bowel sounds present. No organomegaly or mass.  EXTREMITIES: No pedal edema, cyanosis, or clubbing. Tenderness on left knee, but no erythema. NEUROLOGIC: Cranial nerves II through XII are intact. Muscle strength 4/5 in all extremities except 3/5 on right leg. The patient cannot move left leg due to left knee pain. Sensation intact. Gait not checked.  PSYCHIATRIC: The patient is alert and oriented x 3.  SKIN: No obvious rash, lesion, or ulcer.  DATA REVIEW:   CBC  Recent Labs Lab 02/20/16 0622  WBC 7.2  HGB 11.2*  HCT 32.5*  PLT 190     Chemistries   Recent Labs Lab 02/19/16 1140 02/20/16 0622  NA 138 138  K 3.4* 3.6  CL 104 108  CO2 26 22  GLUCOSE 113* 128*  BUN 15 18  CREATININE 0.82 0.85  CALCIUM 8.8* 8.8*  AST 25  --   ALT 13*  --   ALKPHOS 88  --   BILITOT 0.6  --      Microbiology Results  Results for orders placed or performed during the hospital encounter of 08/16/15  Urine culture     Status: Abnormal   Collection Time: 08/16/15  3:17 PM  Result Value Ref Range Status   Specimen Description URINE, RANDOM  Final   Special Requests NONE  Final   Culture MULTIPLE SPECIES PRESENT, SUGGEST RECOLLECTION (A)  Final   Report Status 08/17/2015 FINAL  Final    RADIOLOGY:  No results found.   Management plans discussed with the patient, family and they are in agreement.  CODE STATUS:     Code Status Orders        Start     Ordered   02/19/16 1553  Full code  Continuous     02/19/16 1553    Code Status History    Date Active Date Inactive Code Status Order ID Comments User Context   12/27/2015  1:09 PM 01/01/2016  6:33 PM DNR [id number]  Loletha Grayer, MD ED   09/16/2015  6:52 PM 09/19/2015  7:29 PM Full Code [id number]  Baxter Hire, MD Inpatient   08/16/2015  5:14 PM 08/19/2015  5:35 PM DNR [id number]  Willia Craze, NP Inpatient      TOTAL TIME TAKING CARE OF THIS PATIENT: 32 minutes.    Demetrios Loll M.D on 02/21/2016 at 3:56 PM  Between 7am to 6pm - Pager - [phone]  After 6pm go to [email or web address] - Proofreader  [facility]  Office  [phone]  CC: Primary care physician; Nobie Putnam, DO   Note: This dictation was prepared with Dragon dictation along with smaller phrase technology. Any transcriptional errors that result from this process are unintentional.

## 2016-02-21 NOTE — Progress Notes (Signed)
Patient discharged home per MD order. Prescription given to patient. All discharge instructions given and all questions answered. 

## 2016-02-21 NOTE — Care Management (Signed)
Discharge to home today per Dr. Bridgett Larsson. Spoke with [name] at the bedside. States she has no transportion home. States daughter, Latorsha Kogan, is in the home, but has no car. States she works to 12:30pm-1:00pm every day and her supervisor transports her to work. Stressed that if no transportation was available and she could ne transported per EMS. Discussed that insurance probably would not pay that bill because this transport was not  medically  Necessary. States  will call her daughter when she gets off work and see if the neighbor could transport. Will update [facility] concerning discharge plans.   Will inform Nurse Eston Esters s Matthew Saras RN MSN CCM Care Management.

## 2016-02-22 DIAGNOSIS — I5032 Chronic diastolic (congestive) heart failure: Secondary | ICD-10-CM | POA: Diagnosis not present

## 2016-02-22 DIAGNOSIS — E119 Type 2 diabetes mellitus without complications: Secondary | ICD-10-CM | POA: Diagnosis not present

## 2016-02-22 DIAGNOSIS — I2510 Atherosclerotic heart disease of native coronary artery without angina pectoris: Secondary | ICD-10-CM | POA: Diagnosis not present

## 2016-02-22 DIAGNOSIS — I69354 Hemiplegia and hemiparesis following cerebral infarction affecting left non-dominant side: Secondary | ICD-10-CM | POA: Diagnosis not present

## 2016-02-22 DIAGNOSIS — I110 Hypertensive heart disease with heart failure: Secondary | ICD-10-CM | POA: Diagnosis not present

## 2016-02-22 DIAGNOSIS — J449 Chronic obstructive pulmonary disease, unspecified: Secondary | ICD-10-CM | POA: Diagnosis not present

## 2016-02-25 DIAGNOSIS — J449 Chronic obstructive pulmonary disease, unspecified: Secondary | ICD-10-CM | POA: Diagnosis not present

## 2016-02-25 DIAGNOSIS — I5032 Chronic diastolic (congestive) heart failure: Secondary | ICD-10-CM | POA: Diagnosis not present

## 2016-02-25 DIAGNOSIS — I69354 Hemiplegia and hemiparesis following cerebral infarction affecting left non-dominant side: Secondary | ICD-10-CM | POA: Diagnosis not present

## 2016-02-25 DIAGNOSIS — I110 Hypertensive heart disease with heart failure: Secondary | ICD-10-CM | POA: Diagnosis not present

## 2016-02-25 DIAGNOSIS — E119 Type 2 diabetes mellitus without complications: Secondary | ICD-10-CM | POA: Diagnosis not present

## 2016-02-25 DIAGNOSIS — I2510 Atherosclerotic heart disease of native coronary artery without angina pectoris: Secondary | ICD-10-CM | POA: Diagnosis not present

## 2016-02-26 DIAGNOSIS — E119 Type 2 diabetes mellitus without complications: Secondary | ICD-10-CM | POA: Diagnosis not present

## 2016-02-26 DIAGNOSIS — I110 Hypertensive heart disease with heart failure: Secondary | ICD-10-CM | POA: Diagnosis not present

## 2016-02-26 DIAGNOSIS — I2510 Atherosclerotic heart disease of native coronary artery without angina pectoris: Secondary | ICD-10-CM | POA: Diagnosis not present

## 2016-02-26 DIAGNOSIS — I69354 Hemiplegia and hemiparesis following cerebral infarction affecting left non-dominant side: Secondary | ICD-10-CM | POA: Diagnosis not present

## 2016-02-26 DIAGNOSIS — J449 Chronic obstructive pulmonary disease, unspecified: Secondary | ICD-10-CM | POA: Diagnosis not present

## 2016-02-26 DIAGNOSIS — I5032 Chronic diastolic (congestive) heart failure: Secondary | ICD-10-CM | POA: Diagnosis not present

## 2016-02-27 ENCOUNTER — Emergency Department: Payer: Medicare Other

## 2016-02-27 ENCOUNTER — Encounter: Payer: Self-pay | Admitting: Emergency Medicine

## 2016-02-27 ENCOUNTER — Observation Stay
Admission: EM | Admit: 2016-02-27 | Discharge: 2016-02-28 | Disposition: A | Discharge status: Home / Self Care | Payer: Medicare Other | Attending: Internal Medicine | Admitting: Internal Medicine

## 2016-02-27 DIAGNOSIS — M19042 Primary osteoarthritis, left hand: Secondary | ICD-10-CM | POA: Diagnosis not present

## 2016-02-27 DIAGNOSIS — F17210 Nicotine dependence, cigarettes, uncomplicated: Secondary | ICD-10-CM | POA: Insufficient documentation

## 2016-02-27 DIAGNOSIS — R072 Precordial pain: Principal | ICD-10-CM | POA: Insufficient documentation

## 2016-02-27 DIAGNOSIS — M19071 Primary osteoarthritis, right ankle and foot: Secondary | ICD-10-CM | POA: Diagnosis not present

## 2016-02-27 DIAGNOSIS — E1140 Type 2 diabetes mellitus with diabetic neuropathy, unspecified: Secondary | ICD-10-CM | POA: Insufficient documentation

## 2016-02-27 DIAGNOSIS — J449 Chronic obstructive pulmonary disease, unspecified: Secondary | ICD-10-CM | POA: Diagnosis not present

## 2016-02-27 DIAGNOSIS — R001 Bradycardia, unspecified: Secondary | ICD-10-CM | POA: Diagnosis not present

## 2016-02-27 DIAGNOSIS — E7800 Pure hypercholesterolemia, unspecified: Secondary | ICD-10-CM | POA: Diagnosis not present

## 2016-02-27 DIAGNOSIS — F329 Major depressive disorder, single episode, unspecified: Secondary | ICD-10-CM | POA: Insufficient documentation

## 2016-02-27 DIAGNOSIS — M170 Bilateral primary osteoarthritis of knee: Secondary | ICD-10-CM | POA: Insufficient documentation

## 2016-02-27 DIAGNOSIS — Z91018 Allergy to other foods: Secondary | ICD-10-CM | POA: Insufficient documentation

## 2016-02-27 DIAGNOSIS — E1169 Type 2 diabetes mellitus with other specified complication: Secondary | ICD-10-CM | POA: Insufficient documentation

## 2016-02-27 DIAGNOSIS — M5134 Other intervertebral disc degeneration, thoracic region: Secondary | ICD-10-CM | POA: Insufficient documentation

## 2016-02-27 DIAGNOSIS — E038 Other specified hypothyroidism: Secondary | ICD-10-CM | POA: Diagnosis not present

## 2016-02-27 DIAGNOSIS — I700 Atherosclerosis of aorta: Secondary | ICD-10-CM | POA: Insufficient documentation

## 2016-02-27 DIAGNOSIS — R079 Chest pain, unspecified: Secondary | ICD-10-CM | POA: Diagnosis not present

## 2016-02-27 DIAGNOSIS — Z85828 Personal history of other malignant neoplasm of skin: Secondary | ICD-10-CM | POA: Insufficient documentation

## 2016-02-27 DIAGNOSIS — I110 Hypertensive heart disease with heart failure: Secondary | ICD-10-CM | POA: Diagnosis not present

## 2016-02-27 DIAGNOSIS — K219 Gastro-esophageal reflux disease without esophagitis: Secondary | ICD-10-CM | POA: Insufficient documentation

## 2016-02-27 DIAGNOSIS — G252 Other specified forms of tremor: Secondary | ICD-10-CM | POA: Insufficient documentation

## 2016-02-27 DIAGNOSIS — G8929 Other chronic pain: Secondary | ICD-10-CM | POA: Diagnosis not present

## 2016-02-27 DIAGNOSIS — I5032 Chronic diastolic (congestive) heart failure: Secondary | ICD-10-CM | POA: Diagnosis not present

## 2016-02-27 DIAGNOSIS — I69354 Hemiplegia and hemiparesis following cerebral infarction affecting left non-dominant side: Secondary | ICD-10-CM | POA: Diagnosis not present

## 2016-02-27 DIAGNOSIS — M545 Low back pain: Secondary | ICD-10-CM | POA: Insufficient documentation

## 2016-02-27 DIAGNOSIS — M19072 Primary osteoarthritis, left ankle and foot: Secondary | ICD-10-CM | POA: Insufficient documentation

## 2016-02-27 DIAGNOSIS — Z716 Tobacco abuse counseling: Secondary | ICD-10-CM | POA: Diagnosis not present

## 2016-02-27 DIAGNOSIS — Z888 Allergy status to other drugs, medicaments and biological substances status: Secondary | ICD-10-CM | POA: Insufficient documentation

## 2016-02-27 DIAGNOSIS — Z91041 Radiographic dye allergy status: Secondary | ICD-10-CM | POA: Insufficient documentation

## 2016-02-27 DIAGNOSIS — M19041 Primary osteoarthritis, right hand: Secondary | ICD-10-CM | POA: Insufficient documentation

## 2016-02-27 DIAGNOSIS — J9801 Acute bronchospasm: Secondary | ICD-10-CM

## 2016-02-27 DIAGNOSIS — G40909 Epilepsy, unspecified, not intractable, without status epilepticus: Secondary | ICD-10-CM | POA: Diagnosis not present

## 2016-02-27 DIAGNOSIS — I2510 Atherosclerotic heart disease of native coronary artery without angina pectoris: Secondary | ICD-10-CM | POA: Insufficient documentation

## 2016-02-27 DIAGNOSIS — I252 Old myocardial infarction: Secondary | ICD-10-CM | POA: Diagnosis not present

## 2016-02-27 DIAGNOSIS — Z885 Allergy status to narcotic agent status: Secondary | ICD-10-CM | POA: Insufficient documentation

## 2016-02-27 DIAGNOSIS — I639 Cerebral infarction, unspecified: Secondary | ICD-10-CM | POA: Diagnosis not present

## 2016-02-27 DIAGNOSIS — R0789 Other chest pain: Secondary | ICD-10-CM | POA: Diagnosis not present

## 2016-02-27 LAB — BASIC METABOLIC PANEL
ANION GAP: 9 (ref 5–15)
BUN: 13 mg/dL (ref 6–20)
CHLORIDE: 105 mmol/L (ref 101–111)
CO2: 27 mmol/L (ref 22–32)
Calcium: 9.1 mg/dL (ref 8.9–10.3)
Creatinine, Ser: 0.94 mg/dL (ref 0.44–1.00)
GFR, EST NON AFRICAN AMERICAN: 59 mL/min — AB (ref >60)
Glucose, Bld: 102 mg/dL — ABNORMAL HIGH (ref 65–99)
POTASSIUM: 3.5 mmol/L (ref 3.5–5.1)
SODIUM: 141 mmol/L (ref 135–145)

## 2016-02-27 LAB — INFLUENZA PANEL BY PCR (TYPE A & B)
INFLAPCR: NEGATIVE
INFLBPCR: NEGATIVE

## 2016-02-27 LAB — CBC
HEMATOCRIT: 39 % (ref 35.0–47.0)
HEMOGLOBIN: 12.8 g/dL (ref 12.0–16.0)
MCH: 29.8 pg (ref 26.0–34.0)
MCHC: 32.8 g/dL (ref 32.0–36.0)
MCV: 90.7 fL (ref 80.0–100.0)
Platelets: 237 10*3/uL (ref 150–440)
RBC: 4.3 MIL/uL (ref 3.80–5.20)
RDW: 16.9 % — AB (ref 11.5–14.5)
WBC: 7.1 10*3/uL (ref 3.6–11.0)

## 2016-02-27 LAB — GLUCOSE, CAPILLARY: GLUCOSE-CAPILLARY: 125 mg/dL — AB (ref 65–99)

## 2016-02-27 LAB — TROPONIN I
Troponin I: <0.03 ng/mL (ref <0.03)
Troponin I: <0.03 ng/mL (ref <0.03)

## 2016-02-27 MED ORDER — GABAPENTIN 400 MG PO CAPS
800.0000 mg | ORAL_CAPSULE | Freq: Two times a day (BID) | ORAL | Status: DC
  Administered 2016-02-27 – 2016-02-28 (×2): 800 mg via ORAL
  Filled 2016-02-27 (×2): qty 2

## 2016-02-27 MED ORDER — BACLOFEN 5 MG HALF TABLET
5.0000 mg | ORAL_TABLET | Freq: Three times a day (TID) | ORAL | Status: DC | PRN
  Filled 2016-02-27: qty 2

## 2016-02-27 MED ORDER — DULOXETINE HCL 60 MG PO CPEP
60.0000 mg | ORAL_CAPSULE | Freq: Every day | ORAL | Status: DC
  Administered 2016-02-27: 60 mg via ORAL
  Filled 2016-02-27: qty 1

## 2016-02-27 MED ORDER — FLUTICASONE FUROATE-VILANTEROL 100-25 MCG/INH IN AEPB
1.0000 | INHALATION_SPRAY | Freq: Every day | RESPIRATORY_TRACT | Status: DC
  Administered 2016-02-27 – 2016-02-28 (×2): 1 via RESPIRATORY_TRACT
  Filled 2016-02-27 (×2): qty 28

## 2016-02-27 MED ORDER — IPRATROPIUM-ALBUTEROL 0.5-2.5 (3) MG/3ML IN SOLN
3.0000 mL | Freq: Once | RESPIRATORY_TRACT | Status: AC
  Administered 2016-02-27: 3 mL via RESPIRATORY_TRACT
  Filled 2016-02-27: qty 3

## 2016-02-27 MED ORDER — ACETAMINOPHEN 325 MG PO TABS
650.0000 mg | ORAL_TABLET | Freq: Four times a day (QID) | ORAL | Status: DC | PRN
  Administered 2016-02-27: 650 mg via ORAL

## 2016-02-27 MED ORDER — LISINOPRIL 20 MG PO TABS
20.0000 mg | ORAL_TABLET | Freq: Every day | ORAL | Status: DC
  Administered 2016-02-28: 20 mg via ORAL
  Filled 2016-02-27: qty 1

## 2016-02-27 MED ORDER — INSULIN ASPART 100 UNIT/ML ~~LOC~~ SOLN: 0.0000 [IU] | Freq: Three times a day (TID) | SUBCUTANEOUS | Status: DC

## 2016-02-27 MED ORDER — ASPIRIN-DIPYRIDAMOLE ER 25-200 MG PO CP12
1.0000 | ORAL_CAPSULE | Freq: Two times a day (BID) | ORAL | Status: DC
  Administered 2016-02-27 – 2016-02-28 (×2): 1 via ORAL
  Filled 2016-02-27 (×3): qty 1

## 2016-02-27 MED ORDER — DOCUSATE SODIUM 100 MG PO CAPS: 100.0000 mg | ORAL_CAPSULE | Freq: Two times a day (BID) | ORAL | Status: DC | PRN

## 2016-02-27 MED ORDER — NITROGLYCERIN 0.4 MG SL SUBL: 0.4000 mg | SUBLINGUAL_TABLET | SUBLINGUAL | Status: DC | PRN

## 2016-02-27 MED ORDER — SODIUM CHLORIDE 0.9% FLUSH: 3.0000 mL | INTRAVENOUS | Status: DC | PRN

## 2016-02-27 MED ORDER — SODIUM CHLORIDE 0.9% FLUSH
3.0000 mL | Freq: Two times a day (BID) | INTRAVENOUS | Status: DC
  Administered 2016-02-27 – 2016-02-28 (×2): 3 mL via INTRAVENOUS

## 2016-02-27 MED ORDER — LEVOTHYROXINE SODIUM 125 MCG PO TABS
125.0000 ug | ORAL_TABLET | ORAL | Status: DC
  Administered 2016-02-28: 125 ug via ORAL
  Filled 2016-02-27 (×2): qty 1

## 2016-02-27 MED ORDER — GABAPENTIN 800 MG PO TABS
800.0000 mg | ORAL_TABLET | Freq: Two times a day (BID) | ORAL | Status: DC
  Filled 2016-02-27 (×2): qty 1

## 2016-02-27 MED ORDER — SODIUM CHLORIDE 0.9 % IV BOLUS (SEPSIS)
1000.0000 mL | Freq: Once | INTRAVENOUS | Status: AC
  Administered 2016-02-27: 1000 mL via INTRAVENOUS

## 2016-02-27 MED ORDER — HEPARIN SODIUM (PORCINE) 5000 UNIT/ML IJ SOLN
5000.0000 [IU] | Freq: Three times a day (TID) | INTRAMUSCULAR | Status: DC
  Administered 2016-02-27 – 2016-02-28 (×3): 5000 [IU] via SUBCUTANEOUS
  Filled 2016-02-27 (×3): qty 1

## 2016-02-27 MED ORDER — ATORVASTATIN CALCIUM 20 MG PO TABS
80.0000 mg | ORAL_TABLET | Freq: Every day | ORAL | Status: DC
  Administered 2016-02-27: 80 mg via ORAL
  Filled 2016-02-27: qty 4

## 2016-02-27 MED ORDER — LEVETIRACETAM 500 MG PO TABS
500.0000 mg | ORAL_TABLET | Freq: Two times a day (BID) | ORAL | Status: DC
  Administered 2016-02-27 – 2016-02-28 (×2): 500 mg via ORAL
  Filled 2016-02-27 (×2): qty 1

## 2016-02-27 MED ORDER — ALBUTEROL SULFATE HFA 108 (90 BASE) MCG/ACT IN AERS: 2.0000 | INHALATION_SPRAY | Freq: Four times a day (QID) | RESPIRATORY_TRACT | Status: DC | PRN

## 2016-02-27 MED ORDER — ALBUTEROL SULFATE (2.5 MG/3ML) 0.083% IN NEBU: 2.5000 mg | INHALATION_SOLUTION | Freq: Four times a day (QID) | RESPIRATORY_TRACT | Status: DC | PRN

## 2016-02-27 MED ORDER — FLUTICASONE PROPIONATE 50 MCG/ACT NA SUSP
1.0000 | Freq: Every day | NASAL | Status: DC
  Administered 2016-02-28: 1 via NASAL
  Filled 2016-02-27 (×2): qty 16

## 2016-02-27 MED ORDER — GUAIFENESIN ER 600 MG PO TB12: 600.0000 mg | ORAL_TABLET | Freq: Two times a day (BID) | ORAL | Status: DC | PRN

## 2016-02-27 NOTE — ED Provider Notes (Signed)
[facility] Emergency Department Provider Note  ____________________________________________  Time seen: Approximately 3:44 PM  I have reviewed the triage vital signs and the nursing notes.   HISTORY  Chief Complaint Chest Pain    HPI Samantha Aguirre is a 70 y.o. female who complains of chest pain starting 1-2 hours prior to arrival. She tried to take her home nitroglycerin but found that it was expired. EMS gave nitroglycerin which decreased her pain from a 10 out of 10 to a 5 out of 10. Pain is sharp and constant, associated with shortness of breath and radiation to the left arm. Also worse with exertion. Patient states pain feels similar to previous heart attack.     Past Medical History:  Diagnosis Date  . Acid reflux   . Arthritis    Bilateral knees, hands, feet  . Asthma   . Cancer ([place])   . CHF (congestive heart failure) (H[facility])    Diastolic CHF  . COPD (chronic obstructive pulmonary disease) ([place])   . Coronary artery disease   . Diabetes mellitus without complication ([place])   . Frequent headaches   . Heart attack   . High cholesterol   . HLD (hyperlipidemia)   . Hypertension   . MI (myocardial infarction)   . Seizures ([place])   . Skin cancer 2015   Suspected basal cell carcinoma on nose, surgically removed  . Stroke ([place])   . Thyroid disease   . Tremors of nervous system      Patient Active Problem List   Diagnosis Date Noted  . CVA (cerebral vascular accident) ([place]) 02/19/2016  . Closed fracture of face bones due to fall ([place]) 01/22/2016  . Thumb pain, left 01/22/2016  . Traumatic closed nondisplaced fracture of base of metacarpal bone of left thumb 01/22/2016  . Colon cancer screening 11/27/2015  . Depression 11/14/2015  . Coronary artery disease 11/13/2015  . Hypertension 11/13/2015  . History of skin cancer 11/13/2015  . Osteoporosis 11/13/2015  . Chronic low back pain 11/13/2015  . Left medial knee pain 11/13/2015  .  Osteoarthritis of multiple joints 11/13/2015  . Cerebral infarction ([place]) 09/16/2015  . CVA, old, facial weakness   . Right sided weakness 08/16/2015  . Other specified hypothyroidism 08/16/2015  . Controlled type 2 diabetes mellitus with diabetic neuropathy ([place]) 08/16/2015  . COPD (chronic obstructive pulmonary disease) ([place]) 08/16/2015  . Tobacco abuse 08/16/2015  . HLD (hyperlipidemia) 08/16/2015  . History of CHF (congestive heart failure) 08/16/2015  . Seizures ([place])      Past Surgical History:  Procedure Laterality Date  . ABDOMINAL HYSTERECTOMY  1976  . APPENDECTOMY  1980  . CAROTID ENDARTERECTOMY    . CHOLECYSTECTOMY  1980  . [place]  . KNEE SURGERY     left  . ROTATOR CUFF REPAIR     right  . TONSILLECTOMY  1950     Prior to Admission medications   Medication Sig Start Date End Date Taking? Authorizing Provider  acetaminophen (TYLENOL) 325 MG tablet Take 650 mg by mouth at bedtime.   Yes Historical Provider, MD  albuterol (PROVENTIL HFA;VENTOLIN HFA) 108 (90 Base) MCG/ACT inhaler Inhale 2 puffs into the lungs every 6 (six) hours as needed for wheezing or shortness of breath. 01/01/16  Yes Lytle Butte, MD  atorvastatin (LIPITOR) 80 MG tablet Take 1 tablet (80 mg total) by mouth daily at 6 PM. 02/20/16  Yes Demetrios Loll, MD  baclofen (LIORESAL) 10 MG tablet Take 0.5-1  tablets (5-10 mg total) by mouth 3 (three) times daily as needed for muscle spasms. 11/13/15  Yes Alexander Devin Going, DO  diclofenac sodium (VOLTAREN) 1 % GEL Apply 2 g topically 4 (four) times daily. 01/11/16  Yes Daymon Larsen, MD  dipyridamole-aspirin (AGGRENOX) 200-25 MG 12hr capsule Take 1 capsule by mouth 2 (two) times daily. 09/19/15  Yes Epifanio Lesches, MD  DULoxetine (CYMBALTA) 60 MG capsule Take 1 capsule (60 mg total) by mouth at bedtime. 11/13/15  Yes Alexander J Karamalegos, DO  fluticasone (FLONASE) 50 MCG/ACT nasal spray Place 1 spray into both nostrils daily.  01/02/16  Yes Lytle Butte, MD  fluticasone furoate-vilanterol (BREO ELLIPTA) 100-25 MCG/INH AEPB Inhale 1 puff into the lungs daily. 11/13/15  Yes Alexander J Karamalegos, DO  gabapentin (NEURONTIN) 800 MG tablet Take 1 tablet (800 mg total) by mouth 2 (two) times daily. 11/13/15  Yes Alexander J Karamalegos, DO  glipiZIDE-metformin (METAGLIP) 5-500 MG tablet Take 1-2 tablets by mouth See admin instructions. 2 tablets in the morning and 1 tablet at night 11/13/15  Yes Alexander J Karamalegos, DO  guaiFENesin (MUCINEX) 600 MG 12 hr tablet Take 1 tablet (600 mg total) by mouth 2 (two) times daily as needed for cough or to loosen phlegm. 01/01/16  Yes Lytle Butte, MD  levETIRAcetam (KEPPRA) 500 MG tablet Take 1 tablet (500 mg total) by mouth 2 (two) times daily. 09/19/15  Yes Epifanio Lesches, MD  levothyroxine (SYNTHROID, LEVOTHROID) 125 MCG tablet Take 1 tablet (125 mcg total) by mouth every other day. 11/13/15  Yes Alexander J Karamalegos, DO  lidocaine (LIDODERM) 5 % Place 1 patch onto the skin daily. 01/11/16  Yes Daymon Larsen, MD  lisinopril (PRINIVIL,ZESTRIL) 20 MG tablet Take 1 tablet (20 mg total) by mouth daily. 11/27/15  Yes Alexander J Karamalegos, DO  umeclidinium bromide (INCRUSE ELLIPTA) 62.5 MCG/INH AEPB Inhale 1 puff into the lungs daily. 11/13/15  Yes Olin Hauser, DO     Allergies Mushroom extract complex; Atenolol; Ivp dye [iodinated diagnostic agents]; Morphine and related; Percocet [oxycodone-acetaminophen]; Percodan [oxycodone-aspirin]; and Verapamil   Family History  Problem Relation Age of Onset  . Breast cancer Mother   . Heart disease Mother   . Stroke Mother   . Cancer Mother   . COPD Mother   . Heart disease Father   . Diabetes Father   . Stroke Father   . Alcohol abuse Sister   . Drug abuse Sister   . Stroke Sister   . Cancer Sister   . Mental illness Sister   . Heart disease Brother   . Arthritis Brother   . Diabetes Brother   . Heart  disease Maternal Grandfather   . Heart disease Paternal Grandfather     Social History Social History  Substance Use Topics  . Smoking status: Current Every Day Smoker    Packs/day: 0.25    Years: 50.00    Types: Cigarettes  . Smokeless tobacco: Current User  . Alcohol use No    Review of Systems  Constitutional:   No fever or chills.  ENT:   No sore throat. No rhinorrhea. Cardiovascular:   Positive as above chest pain. Respiratory:   Positive shortness of breath. Positive chronic cough which is unchanged from baseline. Gastrointestinal:   Negative for abdominal pain, vomiting and diarrhea.  Genitourinary:   Negative for dysuria or difficulty urinating. Musculoskeletal:   Negative for focal pain or swelling Neurological:   Negative for headaches 10-point ROS otherwise  negative.  ____________________________________________   PHYSICAL EXAM:  VITAL SIGNS: ED Triage Vitals  Enc Vitals Group     BP 02/27/16 1242 132/73     Pulse Rate 02/27/16 1242 (!) 45     Resp 02/27/16 1242 16     Temp 02/27/16 1242 97.7 F (36.5 C)     Temp Source 02/27/16 1242 Oral     SpO2 02/27/16 1234 100 %     Weight 02/27/16 1243 178 lb (80.7 kg)     Height 02/27/16 1243 5\' 1"  (1.549 m)     Head Circumference --      Peak Flow --      Pain Score 02/27/16 1243 5     Pain Loc --      Pain Edu? --      Excl. in [place]? --     Vital signs reviewed, nursing assessments reviewed.   Constitutional:   Alert and oriented. Well appearing and in no distress. Eyes:   No scleral icterus. No conjunctival pallor. PERRL. EOMI.  No nystagmus. ENT   Head:   Normocephalic and atraumatic.   Nose:   No congestion/rhinnorhea. No septal hematoma   Mouth/Throat:   MMM, no pharyngeal erythema. No peritonsillar mass.    Neck:   No stridor. No SubQ emphysema. No meningismus. Hematological/Lymphatic/Immunilogical:   No cervical lymphadenopathy. Cardiovascular:   RRR. Symmetric bilateral radial and DP  pulses.  No murmurs.  Respiratory:   Normal respiratory effort without tachypnea nor retractions. Coarse breath sounds diffusely with expiratory wheezing diffusely. Mild crackles at bilateral bases. Chest wall nontender Gastrointestinal:   Soft and nontender. Non distended. There is no CVA tenderness.  No rebound, rigidity, or guarding. Genitourinary:   deferred Musculoskeletal:   Nontender with normal range of motion in all extremities. No joint effusions.  No lower extremity tenderness.  No edema. Neurologic:   Normal speech and language.  CN 2-10 normal. Motor grossly intact. No gross focal neurologic deficits are appreciated.  Skin:    Skin is warm, dry and intact. No rash noted.  No petechiae, purpura, or bullae.  ____________________________________________    LABS (pertinent positives/negatives) (all labs ordered are listed, but only abnormal results are displayed) Labs Reviewed  BASIC METABOLIC PANEL - Abnormal; Notable for the following:       Result Value   Glucose, Bld 102 (*)    GFR calc non Af Amer 59 (*)    All other components within normal limits  CBC - Abnormal; Notable for the following:    RDW 16.9 (*)    All other components within normal limits  TROPONIN I  INFLUENZA PANEL BY PCR (TYPE A & B)   ____________________________________________   EKG  Interpreted by me Sinus bradycardia rate of 48, left axis, prolonged QTC of 616 ms. Poor R-wave progression in anterior precordial leads. Normal ST segments and T waves.  ____________________________________________    RADIOLOGY  Chest x-ray unremarkable  ____________________________________________   PROCEDURES Procedures  ____________________________________________   INITIAL IMPRESSION / ASSESSMENT AND PLAN / ED COURSE  Pertinent labs & imaging results that were available during my care of the patient were reviewed by me and considered in my medical decision making (see chart for  details).  Patient presents with precordial chest pain somewhat concerning for cardiac etiology due to her comorbidities and the nature of the symptoms. Not consistent with unstable angina or ACS. EKG is nondiagnostic. Initial troponin negative. Low suspicion for PE or dissection. Given a DuoNeb for wheezing for  symptomatic relief. Influenza is negative.  Case discussed with hospitalist for admission.   Unclear cause of bradycardia. She reports that she was discontinued from rate control agents previously for her bradycardia.     Clinical Course as of Feb 26 1542  Wed Feb 27, 2016  1259 Chest pain due to resp. Infection, possibly flu, vs anginal.  Check labs, cxr, flu pcr. Duoneb. Aspirin given by ems.   [PS]    Clinical Course User Index [PS] Carrie Mew, MD   ____________________________________________   FINAL CLINICAL IMPRESSION(S) / ED DIAGNOSES  Final diagnoses:  Precordial pain  Bronchospasm  Bradycardia      New Prescriptions   No medications on file     Portions of this note were generated with dragon dictation software. Dictation errors may occur despite best attempts at proofreading.    Carrie Mew, MD 02/27/16 [id number]

## 2016-02-27 NOTE — ED Notes (Signed)
Admitting provider finished at bedside

## 2016-02-27 NOTE — ED Triage Notes (Signed)
Pt arrived to ED by [facility] with c/o of chest pain that started on Sunday and has continued to come and go. Pt states pain is on the left side and radiates to her left arm down to her elbow. EMS gave 2 nitro and 324 of aspirin in route. Pt's initial BP was 250/100 and after medication BP was 140/80. Pt's pain level started at 10 and dropped to 5 with first nitro. Per EMS was bradycardic initially at 49 and after medication Hr was 60.

## 2016-02-27 NOTE — H&P (Signed)
[facility] [place] NAME: Samantha Aguirre    MR#:  [id number]  DATE OF BIRTH:  07/15/1945  DATE OF ADMISSION:  02/27/2016  PRIMARY CARE PHYSICIAN: Nobie Putnam, DO   REQUESTING/REFERRING PHYSICIAN: Joni Fears  CHIEF COMPLAINT:   Chief Complaint  Patient presents with  . Chest Pain    HISTORY OF PRESENT ILLNESS: Samantha Aguirre  is a 71 y.o. female with a known history of Acid reflux, asthma, CHF, diastolic CHF, COPD, coronary artery disease, diabetes mellitus, hyperlipidemia, hypertension, seizures, stroke, thyroid disease- was in hospital last week for acute stroke- sent home with home health and physical therapy and she said her left-sided weakness is improving now. Since yesterday she started having intermittent chest pain which is retrosternal radiating to her left side of chest and left arm stabbing type lasting for a few seconds to minutes. She denies any association with activities, also denies any aggravating or relieving factors. She discussed that with her home health nurse today and she suggested to go to emergency room. EMS gave her aspirin and nitroglycerin tablet which helped to relieve the pain and since then she does not have anymore pain. She is taking Aggrenox for her stroke and coronary artery disease, she denies missing any doses at home except for today. She was scheduled to have stress test in December 2017 by Dr. Etta Quill office, but she missed it because of some sickness and then again because she had recently stroke she could never schedule it again.   PAST MEDICAL HISTORY:   Past Medical History:  Diagnosis Date  . Acid reflux   . Arthritis    Bilateral knees, hands, feet  . Asthma   . Cancer ([place])   . Chest pain   . CHF (congestive heart failure) (HCC)    Diastolic CHF  . COPD (chronic obstructive pulmonary disease) ([place])   . Coronary artery disease   . Diabetes mellitus without complication ([place])   .  Frequent headaches   . Heart attack   . High cholesterol   . HLD (hyperlipidemia)   . Hypertension   . MI (myocardial infarction)   . Seizures ([place])   . Skin cancer 2015   Suspected basal cell carcinoma on nose, surgically removed  . Stroke ([place])   . Thyroid disease   . Tremors of nervous system     PAST SURGICAL HISTORY: Past Surgical History:  Procedure Laterality Date  . ABDOMINAL HYSTERECTOMY  1976  . APPENDECTOMY  1980  . CAROTID ENDARTERECTOMY    . CHOLECYSTECTOMY  1980  . [place]  . KNEE SURGERY     left  . ROTATOR CUFF REPAIR     right  . TONSILLECTOMY  1950    SOCIAL HISTORY:  Social History  Substance Use Topics  . Smoking status: Current Every Day Smoker    Packs/day: 0.25    Years: 50.00    Types: Cigarettes  . Smokeless tobacco: Current User  . Alcohol use No    FAMILY HISTORY:  Family History  Problem Relation Age of Onset  . Breast cancer Mother   . Heart disease Mother   . Stroke Mother   . Cancer Mother   . COPD Mother   . Heart disease Father   . Diabetes Father   . Stroke Father   . Alcohol abuse Sister   . Drug abuse Sister   . Stroke Sister   . Cancer Sister   . Mental illness  Sister   . Heart disease Brother   . Arthritis Brother   . Diabetes Brother   . Heart disease Maternal Grandfather   . Heart disease Paternal Grandfather     DRUG ALLERGIES:  Allergies  Allergen Reactions  . Mushroom Extract Complex Anaphylaxis    Made patient "deathly sick"  . Atenolol     "MD took me off of it bc it was doing something wrong" Made patient HYPOTENSIVE  . Ivp Dye [Iodinated Diagnostic Agents] Itching    Pt injected with IV contrast.  5 min after injection pt complained of itching behind ear and on her abd.  . Morphine And Related Other (See Comments)    "stops my breathing" NEAR-RESPIRATORY ARREST  . Percocet [Oxycodone-Acetaminophen] Nausea And Vomiting and Other (See Comments)    Stomach pains  . Percodan  [Oxycodone-Aspirin] Nausea And Vomiting and Other (See Comments)    Stomach pains  . Verapamil Other (See Comments)    " MD told me this was screwing me up" MAKES PATIENT HYPOTENSIVE    REVIEW OF SYSTEMS:   CONSTITUTIONAL: No fever, fatigue or weakness.  EYES: No blurred or double vision.  EARS, NOSE, AND THROAT: No tinnitus or ear pain.  RESPIRATORY: No cough, shortness of breath, wheezing or hemoptysis.  CARDIOVASCULAR: positive for chest pain, no orthopnea, edema.  GASTROINTESTINAL: No nausea, vomiting, diarrhea or abdominal pain.  GENITOURINARY: No dysuria, hematuria.  ENDOCRINE: No polyuria, nocturia,  HEMATOLOGY: No anemia, easy bruising or bleeding SKIN: No rash or lesion. MUSCULOSKELETAL: No joint pain or arthritis.   NEUROLOGIC: No tingling, numbness, weakness.  PSYCHIATRY: No anxiety or depression.   MEDICATIONS AT HOME:  Prior to Admission medications   Medication Sig Start Date End Date Taking? Authorizing Provider  acetaminophen (TYLENOL) 325 MG tablet Take 650 mg by mouth at bedtime.   Yes Historical Provider, MD  albuterol (PROVENTIL HFA;VENTOLIN HFA) 108 (90 Base) MCG/ACT inhaler Inhale 2 puffs into the lungs every 6 (six) hours as needed for wheezing or shortness of breath. 01/01/16  Yes Lytle Butte, MD  atorvastatin (LIPITOR) 80 MG tablet Take 1 tablet (80 mg total) by mouth daily at 6 PM. 02/20/16  Yes Demetrios Loll, MD  baclofen (LIORESAL) 10 MG tablet Take 0.5-1 tablets (5-10 mg total) by mouth 3 (three) times daily as needed for muscle spasms. 11/13/15  Yes Alexander Devin Going, DO  diclofenac sodium (VOLTAREN) 1 % GEL Apply 2 g topically 4 (four) times daily. 01/11/16  Yes Daymon Larsen, MD  dipyridamole-aspirin (AGGRENOX) 200-25 MG 12hr capsule Take 1 capsule by mouth 2 (two) times daily. 09/19/15  Yes Epifanio Lesches, MD  DULoxetine (CYMBALTA) 60 MG capsule Take 1 capsule (60 mg total) by mouth at bedtime. 11/13/15  Yes Alexander J Karamalegos, DO   fluticasone (FLONASE) 50 MCG/ACT nasal spray Place 1 spray into both nostrils daily. 01/02/16  Yes Lytle Butte, MD  fluticasone furoate-vilanterol (BREO ELLIPTA) 100-25 MCG/INH AEPB Inhale 1 puff into the lungs daily. 11/13/15  Yes Alexander J Karamalegos, DO  gabapentin (NEURONTIN) 800 MG tablet Take 1 tablet (800 mg total) by mouth 2 (two) times daily. 11/13/15  Yes Alexander J Karamalegos, DO  glipiZIDE-metformin (METAGLIP) 5-500 MG tablet Take 1-2 tablets by mouth See admin instructions. 2 tablets in the morning and 1 tablet at night 11/13/15  Yes Alexander J Karamalegos, DO  guaiFENesin (MUCINEX) 600 MG 12 hr tablet Take 1 tablet (600 mg total) by mouth 2 (two) times daily as needed for cough or  to loosen phlegm. 01/01/16  Yes Lytle Butte, MD  levETIRAcetam (KEPPRA) 500 MG tablet Take 1 tablet (500 mg total) by mouth 2 (two) times daily. 09/19/15  Yes Epifanio Lesches, MD  levothyroxine (SYNTHROID, LEVOTHROID) 125 MCG tablet Take 1 tablet (125 mcg total) by mouth every other day. 11/13/15  Yes Alexander J Karamalegos, DO  lidocaine (LIDODERM) 5 % Place 1 patch onto the skin daily. 01/11/16  Yes Daymon Larsen, MD  lisinopril (PRINIVIL,ZESTRIL) 20 MG tablet Take 1 tablet (20 mg total) by mouth daily. 11/27/15  Yes Alexander J Karamalegos, DO  umeclidinium bromide (INCRUSE ELLIPTA) 62.5 MCG/INH AEPB Inhale 1 puff into the lungs daily. 11/13/15  Yes Alexander Devin Going, DO      PHYSICAL EXAMINATION:   VITAL SIGNS: Blood pressure (!) 157/74, pulse (!) 45, temperature 97.7 F (36.5 C), temperature source Oral, resp. rate 10, height 5\' 1"  (1.549 m), weight 80.7 kg (178 lb), SpO2 100 %.  GENERAL:  71 y.o.-year-old patient lying in the bed with no acute distress.  EYES: Pupils equal, round, reactive to light and accommodation. No scleral icterus. Extraocular muscles intact.  HEENT: Head atraumatic, normocephalic. Oropharynx and nasopharynx clear.  NECK:  Supple, no jugular venous  distention. No thyroid enlargement, no tenderness.  LUNGS: Normal breath sounds bilaterally, no wheezing, rales,rhonchi or crepitation. No use of accessory muscles of respiration.  CARDIOVASCULAR: S1, S2 normal. No murmurs, rubs, or gallops.  ABDOMEN: Soft, nontender, nondistended. Bowel sounds present. No organomegaly or mass.  EXTREMITIES: No pedal edema, cyanosis, or clubbing.  NEUROLOGIC: Cranial nerves II through XII are intact. Muscle strength 5/5 in all extremities. Sensation intact. Gait not checked.  PSYCHIATRIC: The patient is alert and oriented x 3.  SKIN: No obvious rash, lesion, or ulcer.   LABORATORY PANEL:   CBC  Recent Labs Lab 02/27/16 1240  WBC 7.1  HGB 12.8  HCT 39.0  PLT 237  MCV 90.7  MCH 29.8  MCHC 32.8  RDW 16.9*   ------------------------------------------------------------------------------------------------------------------  Chemistries   Recent Labs Lab 02/27/16 1240  NA 141  K 3.5  CL 105  CO2 27  GLUCOSE 102*  BUN 13  CREATININE 0.94  CALCIUM 9.1   ------------------------------------------------------------------------------------------------------------------ estimated creatinine clearance is 52.1 mL/min (by C-G formula based on SCr of 0.94 mg/dL). ------------------------------------------------------------------------------------------------------------------ No results for input(s): TSH, T4TOTAL, T3FREE, THYROIDAB in the last 72 hours.  Invalid input(s): FREET3   Coagulation profile No results for input(s): INR, PROTIME in the last 168 hours. ------------------------------------------------------------------------------------------------------------------- No results for input(s): DDIMER in the last 72 hours. -------------------------------------------------------------------------------------------------------------------  Cardiac Enzymes  Recent Labs Lab 02/27/16 1240 02/27/16 1537  TROPONINI <0.03 <0.03    ------------------------------------------------------------------------------------------------------------------ Invalid input(s): POCBNP  ---------------------------------------------------------------------------------------------------------------  Urinalysis    Component Value Date/Time   COLORURINE YELLOW 08/16/2015 1517   APPEARANCEUR HAZY (A) 08/16/2015 1517   LABSPEC 1.016 08/16/2015 1517   PHURINE 6.5 08/16/2015 1517   GLUCOSEU 100 (A) 08/16/2015 1517   HGBUR NEGATIVE 08/16/2015 1517   BILIRUBINUR NEGATIVE 08/16/2015 1517   KETONESUR NEGATIVE 08/16/2015 1517   PROTEINUR NEGATIVE 08/16/2015 1517   NITRITE NEGATIVE 08/16/2015 1517   LEUKOCYTESUR TRACE (A) 08/16/2015 1517     RADIOLOGY: Dg Chest 2 View  Result Date: 02/27/2016 CLINICAL DATA:  Onset of intermittent episodes of left-sided chest pain radiating into the left arm to the elbow 4 days ago. History of asthma - Celsius OPD, current smoker, coronary artery disease with previous MI. EXAM: CHEST  2 VIEW COMPARISON:  Portable chest x-ray of  December 31, 2015. FINDINGS: The lungs are mildly hyperinflated. There is no focal infiltrate. There is no pleural effusion. The heart is mildly enlarged but stable. The pulmonary vascularity is normal. There is calcification in the wall of the aortic arch. There is multilevel degenerative disc disease of the thoracic spine. IMPRESSION: Mild chronic bronchitic-smoking related changes. No acute pneumonia. Stable cardiomegaly without pulmonary edema. Thoracic aortic atherosclerosis. Electronically Signed   By: David  Martinique M.D.   On: 02/27/2016 14:11    EKG: Orders placed or performed during the hospital encounter of 02/27/16  . ED EKG within 10 minutes  . ED EKG within 10 minutes  . EKG 12-Lead  . EKG 12-Lead    IMPRESSION AND PLAN:  * Anginal chest pain   Patient was scheduled to have stress test as outpatient, monitor on telemetry and follow serial troponin.   Continue  Aggrenox, stress test tomorrow morning.   Cardiology consult with primary cardiologist.   She is not taking any beta blockers but she is bradycardic currently, so we cannot add any.   Continue atorvastatin.  * Bradycardia   Continue monitoring on telemetry, check TSH.  * Recent stroke   Recovering, she may need home health and PT again on discharge.   Continue statin and Aggrenox.  * Hypothyroidism   Continue levothyroxine, check TSH.  * Diabetes   Cold oral medication that she may go for stress test tomorrow.   Keep on insulin sliding scale coverage.  * Hypertension   Continue lisinopril.  * Active smoker   Counseled to quit smoking for 4 minutes and offered nicotine patch.   All the records are reviewed and case discussed with ED provider. Management plans discussed with the patient, family and they are in agreement.  CODE STATUS: Code Status History    Date Active Date Inactive Code Status Order ID Comments User Context   02/19/2016  3:53 PM 02/21/2016  8:41 PM Full Code [id number]  Gladstone Lighter, MD Inpatient   12/27/2015  1:09 PM 01/01/2016  6:33 PM DNR [id number]  Loletha Grayer, MD ED   09/16/2015  6:52 PM 09/19/2015  7:29 PM Full Code [id number]  Baxter Hire, MD Inpatient   08/16/2015  5:14 PM 08/19/2015  5:35 PM DNR [id number]  Willia Craze, NP Inpatient       TOTAL TIME TAKING CARE OF THIS PATIENT: 50 minutes.    Vaughan Basta M.D on 02/27/2016   Between 7am to 6pm - Pager - [phone]  After 6pm go to [email or web address] - password [email or web address] [facility]  Office  [phone]  CC: Primary care physician; Nobie Putnam, DO   Note: This dictation was prepared with Dragon dictation along with smaller phrase technology. Any transcriptional errors that result from this process are unintentional.

## 2016-02-28 ENCOUNTER — Observation Stay: Payer: Medicare Other

## 2016-02-28 ENCOUNTER — Encounter: Payer: Self-pay | Admitting: Radiology

## 2016-02-28 ENCOUNTER — Ambulatory Visit: Payer: Medicare Other | Admitting: Family Medicine

## 2016-02-28 DIAGNOSIS — R079 Chest pain, unspecified: Secondary | ICD-10-CM | POA: Diagnosis not present

## 2016-02-28 DIAGNOSIS — E1140 Type 2 diabetes mellitus with diabetic neuropathy, unspecified: Secondary | ICD-10-CM | POA: Diagnosis not present

## 2016-02-28 DIAGNOSIS — I2510 Atherosclerotic heart disease of native coronary artery without angina pectoris: Secondary | ICD-10-CM | POA: Diagnosis not present

## 2016-02-28 DIAGNOSIS — R072 Precordial pain: Secondary | ICD-10-CM | POA: Diagnosis not present

## 2016-02-28 DIAGNOSIS — I200 Unstable angina: Secondary | ICD-10-CM | POA: Diagnosis not present

## 2016-02-28 LAB — BASIC METABOLIC PANEL
ANION GAP: 7 (ref 5–15)
Anion gap: 6 (ref 5–15)
BUN: 13 mg/dL (ref 6–20)
BUN: 14 mg/dL (ref 6–20)
CHLORIDE: 108 mmol/L (ref 101–111)
CO2: 26 mmol/L (ref 22–32)
CO2: 24 mmol/L (ref 22–32)
CREATININE: 0.66 mg/dL (ref 0.44–1.00)
Calcium: 8.3 mg/dL — ABNORMAL LOW (ref 8.9–10.3)
Calcium: 8.6 mg/dL — ABNORMAL LOW (ref 8.9–10.3)
Chloride: 107 mmol/L (ref 101–111)
Creatinine, Ser: 0.67 mg/dL (ref 0.44–1.00)
GFR calc Af Amer: >60 mL/min (ref >60)
GFR calc Af Amer: >60 mL/min (ref >60)
GFR calc non Af Amer: >60 mL/min (ref >60)
GLUCOSE: 135 mg/dL — AB (ref 65–99)
Glucose, Bld: 127 mg/dL — ABNORMAL HIGH (ref 65–99)
POTASSIUM: 2.8 mmol/L — AB (ref 3.5–5.1)
Potassium: 4.1 mmol/L (ref 3.5–5.1)
SODIUM: 138 mmol/L (ref 135–145)
Sodium: 140 mmol/L (ref 135–145)

## 2016-02-28 LAB — LIPID PANEL
CHOL/HDL RATIO: 4.9 ratio
Cholesterol: 212 mg/dL — ABNORMAL HIGH (ref 0–200)
HDL: 43 mg/dL (ref >40)
LDL CALC: 112 mg/dL — AB (ref 0–99)
Triglycerides: 286 mg/dL — ABNORMAL HIGH (ref <150)
VLDL: 57 mg/dL — AB (ref 0–40)

## 2016-02-28 LAB — MAGNESIUM
MAGNESIUM: 2.1 mg/dL (ref 1.7–2.4)
Magnesium: 2 mg/dL (ref 1.7–2.4)

## 2016-02-28 LAB — CBC
HEMATOCRIT: 31.6 % — AB (ref 35.0–47.0)
HEMOGLOBIN: 11 g/dL — AB (ref 12.0–16.0)
MCH: 30.9 pg (ref 26.0–34.0)
MCHC: 34.7 g/dL (ref 32.0–36.0)
MCV: 88.9 fL (ref 80.0–100.0)
Platelets: 200 10*3/uL (ref 150–440)
RBC: 3.56 MIL/uL — ABNORMAL LOW (ref 3.80–5.20)
RDW: 16.7 % — AB (ref 11.5–14.5)
WBC: 7 10*3/uL (ref 3.6–11.0)

## 2016-02-28 LAB — TROPONIN I: Troponin I: <0.03 ng/mL (ref <0.03)

## 2016-02-28 LAB — GLUCOSE, CAPILLARY
GLUCOSE-CAPILLARY: 106 mg/dL — AB (ref 65–99)
GLUCOSE-CAPILLARY: 123 mg/dL — AB (ref 65–99)
Glucose-Capillary: 108 mg/dL — ABNORMAL HIGH (ref 65–99)

## 2016-02-28 LAB — TSH: TSH: 159 u[IU]/mL — ABNORMAL HIGH (ref 0.350–4.500)

## 2016-02-28 LAB — PHOSPHORUS: Phosphorus: 3.3 mg/dL (ref 2.5–4.6)

## 2016-02-28 MED ORDER — LEVOTHYROXINE SODIUM 175 MCG PO TABS: 175.0000 ug | ORAL_TABLET | Freq: Every day | ORAL | 0 refills | Status: DC

## 2016-02-28 MED ORDER — TECHNETIUM TC 99M TETROFOSMIN IV KIT
12.6800 | PACK | Freq: Once | INTRAVENOUS | Status: AC | PRN
  Administered 2016-02-28: 12.68 via INTRAVENOUS

## 2016-02-28 MED ORDER — TECHNETIUM TC 99M TETROFOSMIN IV KIT
32.6670 | PACK | Freq: Once | INTRAVENOUS | Status: AC | PRN
  Administered 2016-02-28: 32.667 via INTRAVENOUS

## 2016-02-28 MED ORDER — LEVOTHYROXINE SODIUM 25 MCG PO TABS: 175.0000 ug | ORAL_TABLET | Freq: Every day | ORAL | Status: DC

## 2016-02-28 MED ORDER — SODIUM CHLORIDE 0.9 % IV SOLN
30.0000 meq | Freq: Once | INTRAVENOUS | Status: AC
  Administered 2016-02-28: 30 meq via INTRAVENOUS
  Filled 2016-02-28: qty 15

## 2016-02-28 MED ORDER — POTASSIUM CHLORIDE CRYS ER 20 MEQ PO TBCR
40.0000 meq | EXTENDED_RELEASE_TABLET | Freq: Once | ORAL | Status: AC
  Administered 2016-02-28: 40 meq via ORAL
  Filled 2016-02-28: qty 2

## 2016-02-28 MED ORDER — REGADENOSON 0.4 MG/5ML IV SOLN
0.4000 mg | Freq: Once | INTRAVENOUS | Status: AC
  Administered 2016-02-28: 0.4 mg via INTRAVENOUS

## 2016-02-28 MED ORDER — NITROGLYCERIN 0.4 MG SL SUBL: 0.4000 mg | SUBLINGUAL_TABLET | SUBLINGUAL | 0 refills | Status: DC | PRN

## 2016-02-28 NOTE — Progress Notes (Signed)
MEDICATION RELATED CONSULT NOTE - INITIAL   Pharmacy Consult for electrolyte supplement and monitor Indication: hypokalemia  Allergies  Allergen Reactions  . Mushroom Extract Complex Anaphylaxis    Made patient "deathly sick"  . Atenolol     "MD took me off of it bc it was doing something wrong" Made patient HYPOTENSIVE  . Ivp Dye [Iodinated Diagnostic Agents] Itching    Pt injected with IV contrast.  5 min after injection pt complained of itching behind ear and on her abd.  . Morphine And Related Other (See Comments)    "stops my breathing" NEAR-RESPIRATORY ARREST  . Percocet [Oxycodone-Acetaminophen] Nausea And Vomiting and Other (See Comments)    Stomach pains  . Percodan [Oxycodone-Aspirin] Nausea And Vomiting and Other (See Comments)    Stomach pains  . Verapamil Other (See Comments)    " MD told me this was screwing me up" MAKES PATIENT HYPOTENSIVE    Patient Measurements: Height: 5\' 1"  (154.9 cm) Weight: 177 lb 14.4 oz (80.7 kg) IBW/kg (Calculated) : 47.8 Adjusted Body Weight:   Vital Signs: Temp: 98.3 F (36.8 C) (02/15 0422) Temp Source: Oral (02/15 0422) BP: 115/55 (02/15 0422) Pulse Rate: 45 (02/15 0422) Intake/Output from previous day: 02/14 0701 - 02/15 0700 In: -  Out: 500 [Urine:500] Intake/Output from this shift: No intake/output data recorded.  Labs:  Recent Labs  02/27/16 1240 02/28/16 0205  WBC 7.1 7.0  HGB 12.8 11.0*  HCT 39.0 31.6*  PLT 237 200  CREATININE 0.94 0.67   Estimated Creatinine Clearance: 61.2 mL/min (by C-G formula based on SCr of 0.67 mg/dL).   Microbiology: No results found for this or any previous visit (from the past 720 hour(s)).  Medical History: Past Medical History:  Diagnosis Date  . Acid reflux   . Arthritis    Bilateral knees, hands, feet  . Asthma   . Cancer ([place])   . Chest pain   . CHF (congestive heart failure) (HCC)    Diastolic CHF  . COPD (chronic obstructive pulmonary disease) ([place])   .  Coronary artery disease   . Diabetes mellitus without complication ([place])   . Frequent headaches   . Heart attack   . High cholesterol   . HLD (hyperlipidemia)   . Hypertension   . MI (myocardial infarction)   . Seizures ([place])   . Skin cancer 2015   Suspected basal cell carcinoma on nose, surgically removed  . Stroke ([place])   . Thyroid disease   . Tremors of nervous system     Medications:  Infusions:    Assessment: 71 yof cc CP. Electrolytes low this AM, pharmacy consulted to replete.  Goal of Therapy:  Electrolytes WNL  Plan:  Patient received IV and PO potassium supplements this AM per hospitalist. Will recheck electrolytes this afternoon and redose per protocol.   Laural Benes, Pharm.D., BCPS Clinical Pharmacist 02/28/2016,7:10 AM

## 2016-02-28 NOTE — Progress Notes (Signed)
Notified physician of low potassium level, orders placed by Dr.Pyreddy.

## 2016-02-28 NOTE — Progress Notes (Signed)
Back from nuclear medicine 

## 2016-02-28 NOTE — Progress Notes (Signed)
[facility] [place] NAME: Samantha Aguirre    MR#:  [id number]  DATE OF BIRTH:  01-Jun-1945  SUBJECTIVE:  has cough no chest pain  REVIEW OF SYSTEMS:    Review of Systems  Constitutional: Negative.  Negative for chills, fever and malaise/fatigue.  HENT: Negative.  Negative for ear discharge, ear pain, hearing loss, nosebleeds and sore throat.   Eyes: Negative.  Negative for blurred vision and pain.  Respiratory: Positive for cough. Negative for hemoptysis, shortness of breath and wheezing.   Cardiovascular: Negative.  Negative for chest pain, palpitations and leg swelling.  Gastrointestinal: Negative.  Negative for abdominal pain, blood in stool, diarrhea, nausea and vomiting.  Genitourinary: Negative.  Negative for dysuria.  Musculoskeletal: Negative.  Negative for back pain.  Skin: Negative.   Neurological: Negative for dizziness, tremors, speech change, focal weakness, seizures and headaches.  Endo/Heme/Allergies: Negative.  Does not bruise/bleed easily.  Psychiatric/Behavioral: Negative.  Negative for depression, hallucinations and suicidal ideas.    Tolerating Diet:yes      DRUG ALLERGIES:   Allergies  Allergen Reactions  . Mushroom Extract Complex Anaphylaxis    Made patient "deathly sick"  . Atenolol     "MD took me off of it bc it was doing something wrong" Made patient HYPOTENSIVE  . Ivp Dye [Iodinated Diagnostic Agents] Itching    Pt injected with IV contrast.  5 min after injection pt complained of itching behind ear and on her abd.  . Morphine And Related Other (See Comments)    "stops my breathing" NEAR-RESPIRATORY ARREST  . Percocet [Oxycodone-Acetaminophen] Nausea And Vomiting and Other (See Comments)    Stomach pains  . Percodan [Oxycodone-Aspirin] Nausea And Vomiting and Other (See Comments)    Stomach pains  . Verapamil Other (See Comments)    " MD told me this was screwing me up" MAKES PATIENT HYPOTENSIVE     VITALS:  Blood pressure 133/62, pulse (!) 41, temperature 97.6 F (36.4 C), temperature source Oral, resp. rate 17, height 5\' 1"  (1.549 m), weight 80.7 kg (177 lb 14.4 oz), SpO2 98 %.  PHYSICAL EXAMINATION:   Physical Exam    LABORATORY PANEL:   CBC  Recent Labs Lab 02/28/16 0205  WBC 7.0  HGB 11.0*  HCT 31.6*  PLT 200   ------------------------------------------------------------------------------------------------------------------  Chemistries   Recent Labs Lab 02/28/16 0205  NA 140  K 2.8*  CL 107  CO2 26  GLUCOSE 135*  BUN 13  CREATININE 0.67  CALCIUM 8.3*  MG 2.0   ------------------------------------------------------------------------------------------------------------------  Cardiac Enzymes  Recent Labs Lab 02/27/16 1752 02/27/16 2213 02/28/16 0205  TROPONINI <0.03 <0.03 <0.03   ------------------------------------------------------------------------------------------------------------------  RADIOLOGY:  Dg Chest 2 View  Result Date: 02/27/2016 CLINICAL DATA:  Onset of intermittent episodes of left-sided chest pain radiating into the left arm to the elbow 4 days ago. History of asthma - Celsius OPD, current smoker, coronary artery disease with previous MI. EXAM: CHEST  2 VIEW COMPARISON:  Portable chest x-ray of December 31, 2015. FINDINGS: The lungs are mildly hyperinflated. There is no focal infiltrate. There is no pleural effusion. The heart is mildly enlarged but stable. The pulmonary vascularity is normal. There is calcification in the wall of the aortic arch. There is multilevel degenerative disc disease of the thoracic spine. IMPRESSION: Mild chronic bronchitic-smoking related changes. No acute pneumonia. Stable cardiomegaly without pulmonary edema. Thoracic aortic atherosclerosis. Electronically Signed   By: David  Martinique M.D.   On: 02/27/2016 14:11  ASSESSMENT AND PLAN:   71 y/o female with diastolic CHF, CAD and COPD who  presents with chest pain.  1. Chest pain: Patient underwent cardiac stress test and was evaluated by Cardiology. Her troponins were negative indicating that this is not ACS.  2. Hypothyroid: Her TSH was >100. She will start Synthroid 175 mcg daily. I discussed this with her.  3. COPD without exacerbation: She will continue inhalers.  4. CAD: Continue statin and Aggrenox, Lisinopril  5. Recent CVA Continue Aggrenox and Statin  6. Diabetes: Continue outpatient oral medications with ADA diet.  7. Tobacco dependence: Patient is encouraged to quit smoking. Counseling was provided for 4 minutes.   8. Hx seizure d/o on Keppra 9. Depression: Continue Cymbalta       Management plans discussed with the patient and she is in agreement.  CODE STATUS: full  TOTAL TIME TAKING CARE OF THIS PATIENT: 30 minutes.     POSSIBLE D/C today, DEPENDING ON CLINICAL CONDITION.   Samantha Aguirre M.D on 02/28/2016 at 11:49 AM  Between 7am to 6pm - Pager - [phone] After 6pm go to [email or web address] - password [email or web address] [facility]  Office  [phone]  CC: Primary care physician; Nobie Putnam, DO  Note: This dictation was prepared with Dragon dictation along with smaller phrase technology. Any transcriptional errors that result from this process are unintentional.

## 2016-02-28 NOTE — Plan of Care (Signed)
Problem: Safety: Goal: Ability to remain free from injury will improve Outcome: Progressing Fall precautions in place  Problem: Pain Managment: Goal: General experience of comfort will improve Outcome: Progressing Prn medications  Problem: Tissue Perfusion: Goal: Risk factors for ineffective tissue perfusion will decrease Outcome: Progressing SQ Heparin

## 2016-02-28 NOTE — Discharge Summary (Addendum)
[facility] NAME: Samantha Aguirre    MR#:  [id number]  DATE OF BIRTH:  1945-05-26  DATE OF ADMISSION:  02/27/2016 ADMITTING PHYSICIAN: Vaughan Basta, MD  DATE OF DISCHARGE: 02/28/2016  PRIMARY CARE PHYSICIAN: Nobie Putnam, DO    ADMISSION DIAGNOSIS:  Precordial pain [R07.2] Bradycardia [R00.1] Bronchospasm [J98.01] Chest pain [R07.9]  DISCHARGE DIAGNOSIS:  Principal Problem:   Chest pain   SECONDARY DIAGNOSIS:   Past Medical History:  Diagnosis Date  . Acid reflux   . Arthritis    Bilateral knees, hands, feet  . Asthma   . Cancer ([place])   . Chest pain   . CHF (congestive heart failure) (HCC)    Diastolic CHF  . COPD (chronic obstructive pulmonary disease) ([place])   . Coronary artery disease   . Diabetes mellitus without complication ([place])   . Frequent headaches   . Heart attack   . High cholesterol   . HLD (hyperlipidemia)   . Hypertension   . MI (myocardial infarction)   . Seizures ([place])   . Skin cancer 2015   Suspected basal cell carcinoma on nose, surgically removed  . Stroke ([place])   . Thyroid disease   . Tremors of nervous system     HOSPITAL COURSE:   71 y/o female with diastolic CHF, CAD and COPD who presents with chest pain.  1. Chest pain: Patient underwent cardiac stress test and was evaluated by Cardiology. Her troponins were negative indicating that this is not ACS. Her stress test was negative. She will have outpatient follow up with Cardiology.   2. Hypothyroid: Her TSH was >100. She will start Synthroid 175 mcg daily. I discussed this with her.  3. COPD without exacerbation: She will continue inhalers.  4. CAD: Continue statin and Aggrenox, Lisinopril  5. Recent CVA Continue Aggrenox and Statin  6. Diabetes: Continue outpatient oral medications with ADA diet.  7. Tobacco dependence: Patient is encouraged to quit smoking. Counseling was provided for 4 minutes.   8.  Hx seizure d/o on Keppra 9. Depression: Continue Cymbalta  DISCHARGE CONDITIONS AND DIET:   Stable Cardiac diabetic  CONSULTS OBTAINED:  Treatment Team:  Isaias Cowman, MD Yolonda Kida, MD  DRUG ALLERGIES:   Allergies  Allergen Reactions  . Mushroom Extract Complex Anaphylaxis    Made patient "deathly sick"  . Atenolol     "MD took me off of it bc it was doing something wrong" Made patient HYPOTENSIVE  . Ivp Dye [Iodinated Diagnostic Agents] Itching    Pt injected with IV contrast.  5 min after injection pt complained of itching behind ear and on her abd.  . Morphine And Related Other (See Comments)    "stops my breathing" NEAR-RESPIRATORY ARREST  . Percocet [Oxycodone-Acetaminophen] Nausea And Vomiting and Other (See Comments)    Stomach pains  . Percodan [Oxycodone-Aspirin] Nausea And Vomiting and Other (See Comments)    Stomach pains  . Verapamil Other (See Comments)    " MD told me this was screwing me up" MAKES PATIENT HYPOTENSIVE    DISCHARGE MEDICATIONS:   Current Discharge Medication List    START taking these medications   Details  nitroGLYCERIN (NITROSTAT) 0.4 MG SL tablet Place 1 tablet (0.4 mg total) under the tongue every 5 (five) minutes as needed for chest pain. Qty: 30 tablet, Refills: 0      CONTINUE these medications which have CHANGED   Details  levothyroxine (SYNTHROID, LEVOTHROID) 175 MCG tablet  Take 1 tablet (175 mcg total) by mouth daily before breakfast. Qty: 30 tablet, Refills: 0      CONTINUE these medications which have NOT CHANGED   Details  acetaminophen (TYLENOL) 325 MG tablet Take 650 mg by mouth at bedtime.    albuterol (PROVENTIL HFA;VENTOLIN HFA) 108 (90 Base) MCG/ACT inhaler Inhale 2 puffs into the lungs every 6 (six) hours as needed for wheezing or shortness of breath. Qty: 1 Inhaler, Refills: 2    atorvastatin (LIPITOR) 80 MG tablet Take 1 tablet (80 mg total) by mouth daily at 6 PM. Qty: 30 tablet, Refills: 0     baclofen (LIORESAL) 10 MG tablet Take 0.5-1 tablets (5-10 mg total) by mouth 3 (three) times daily as needed for muscle spasms. Qty: 30 each, Refills: 1   Associated Diagnoses: Left medial knee pain; Primary osteoarthritis involving multiple joints    diclofenac sodium (VOLTAREN) 1 % GEL Apply 2 g topically 4 (four) times daily. Qty: 1 Tube, Refills: 1    dipyridamole-aspirin (AGGRENOX) 200-25 MG 12hr capsule Take 1 capsule by mouth 2 (two) times daily. Qty: 60 capsule, Refills: 0    DULoxetine (CYMBALTA) 60 MG capsule Take 1 capsule (60 mg total) by mouth at bedtime. Qty: 30 capsule, Refills: 11   Associated Diagnoses: Chronic bilateral low back pain without sciatica    fluticasone (FLONASE) 50 MCG/ACT nasal spray Place 1 spray into both nostrils daily. Qty: 16 g, Refills: 2    fluticasone furoate-vilanterol (BREO ELLIPTA) 100-25 MCG/INH AEPB Inhale 1 puff into the lungs daily. Qty: 1 each, Refills: 11   Associated Diagnoses: Simple chronic bronchitis (HCC)    gabapentin (NEURONTIN) 800 MG tablet Take 1 tablet (800 mg total) by mouth 2 (two) times daily. Qty: 60 tablet, Refills: 11   Associated Diagnoses: Controlled type 2 diabetes mellitus with diabetic neuropathy, without long-term current use of insulin (HCC)    glipiZIDE-metformin (METAGLIP) 5-500 MG tablet Take 1-2 tablets by mouth See admin instructions. 2 tablets in the morning and 1 tablet at night Qty: 90 tablet, Refills: 5   Associated Diagnoses: Controlled type 2 diabetes mellitus with diabetic neuropathy, without long-term current use of insulin (HCC)    guaiFENesin (MUCINEX) 600 MG 12 hr tablet Take 1 tablet (600 mg total) by mouth 2 (two) times daily as needed for cough or to loosen phlegm. Qty: 30 tablet, Refills: 0    levETIRAcetam (KEPPRA) 500 MG tablet Take 1 tablet (500 mg total) by mouth 2 (two) times daily. Qty: 60 tablet, Refills: 0    lidocaine (LIDODERM) 5 % Place 1 patch onto the skin daily. Qty: 15  patch, Refills: 0    lisinopril (PRINIVIL,ZESTRIL) 20 MG tablet Take 1 tablet (20 mg total) by mouth daily. Qty: 30 tablet, Refills: 11   Associated Diagnoses: Essential hypertension; Controlled type 2 diabetes mellitus with diabetic neuropathy, without long-term current use of insulin (HCC)    umeclidinium bromide (INCRUSE ELLIPTA) 62.5 MCG/INH AEPB Inhale 1 puff into the lungs daily. Qty: 1 each, Refills: 11   Associated Diagnoses: Simple chronic bronchitis ([place])              Today   CHIEF COMPLAINT:  Doing well no chest pain overnight Has a cough   VITAL SIGNS:  Blood pressure 133/62, pulse (!) 41, temperature 97.6 F (36.4 C), temperature source Oral, resp. rate 17, height 5\' 1"  (1.549 m), weight 80.7 kg (177 lb 14.4 oz), SpO2 98 %.   REVIEW OF SYSTEMS:  Review of Systems  Constitutional: Negative.  Negative for chills, fever and malaise/fatigue.  HENT: Negative.  Negative for ear discharge, ear pain, hearing loss, nosebleeds and sore throat.   Eyes: Negative.  Negative for blurred vision and pain.  Respiratory: Positive for cough. Negative for hemoptysis, shortness of breath and wheezing.   Cardiovascular: Negative.  Negative for chest pain, palpitations and leg swelling.  Gastrointestinal: Negative.  Negative for abdominal pain, blood in stool, diarrhea, nausea and vomiting.  Genitourinary: Negative.  Negative for dysuria.  Musculoskeletal: Negative.  Negative for back pain.  Skin: Negative.   Neurological: Negative for dizziness, tremors, speech change, focal weakness, seizures and headaches.  Endo/Heme/Allergies: Negative.  Does not bruise/bleed easily.  Psychiatric/Behavioral: Negative.  Negative for depression, hallucinations and suicidal ideas.     PHYSICAL EXAMINATION:  GENERAL:  71 y.o.-year-old patient lying in the bed with no acute distress.  NECK:  Supple, no jugular venous distention. No thyroid enlargement, no tenderness.  LUNGS: Normal breath  sounds bilaterally, no wheezing, rales ++ b/l rhonchi  No use of accessory muscles of respiration.  CARDIOVASCULAR: S1, S2 normal. No murmurs, rubs, or gallops.  ABDOMEN: Soft, non-tender, non-distended. Bowel sounds present. No organomegaly or mass.  EXTREMITIES: No pedal edema, cyanosis, or clubbing.  PSYCHIATRIC: The patient is alert and oriented x 3.  SKIN: No obvious rash, lesion, or ulcer.   DATA REVIEW:   CBC  Recent Labs Lab 02/28/16 0205  WBC 7.0  HGB 11.0*  HCT 31.6*  PLT 200    Chemistries   Recent Labs Lab 02/28/16 0205  NA 140  K 2.8*  CL 107  CO2 26  GLUCOSE 135*  BUN 13  CREATININE 0.67  CALCIUM 8.3*  MG 2.0    Cardiac Enzymes  Recent Labs Lab 02/27/16 1752 02/27/16 2213 02/28/16 0205  TROPONINI <0.03 <0.03 <0.03    Microbiology Results  @MICRORSLT48 @  RADIOLOGY:  Dg Chest 2 View  Result Date: 02/27/2016 CLINICAL DATA:  Onset of intermittent episodes of left-sided chest pain radiating into the left arm to the elbow 4 days ago. History of asthma - Celsius OPD, current smoker, coronary artery disease with previous MI. EXAM: CHEST  2 VIEW COMPARISON:  Portable chest x-ray of December 31, 2015. FINDINGS: The lungs are mildly hyperinflated. There is no focal infiltrate. There is no pleural effusion. The heart is mildly enlarged but stable. The pulmonary vascularity is normal. There is calcification in the wall of the aortic arch. There is multilevel degenerative disc disease of the thoracic spine. IMPRESSION: Mild chronic bronchitic-smoking related changes. No acute pneumonia. Stable cardiomegaly without pulmonary edema. Thoracic aortic atherosclerosis. Electronically Signed   By: David  Martinique M.D.   On: 02/27/2016 14:11      Management plans discussed with the patient and she is in agreement. Stable for discharge   Patient should follow up with pcp  CODE STATUS:     Code Status Orders        Start     Ordered   02/27/16 1745  Full code   Continuous     02/27/16 1744    Code Status History    Date Active Date Inactive Code Status Order ID Comments User Context   02/19/2016  3:53 PM 02/21/2016  8:41 PM Full Code [id number]  Gladstone Lighter, MD Inpatient   12/27/2015  1:09 PM 01/01/2016  6:33 PM DNR [id number]  Loletha Grayer, MD ED   09/16/2015  6:52 PM 09/19/2015  7:29 PM Full Code [id number]  Baxter Hire, MD Inpatient  08/16/2015  5:14 PM 08/19/2015  5:35 PM DNR [id number]  Willia Craze, NP Inpatient      TOTAL TIME TAKING CARE OF THIS PATIENT: 38 minutes.    Note: This dictation was prepared with Dragon dictation along with smaller phrase technology. Any transcriptional errors that result from this process are unintentional.  Aadon Gorelik M.D on 02/28/2016 at 11:38 AM  Between 7am to 6pm - Pager - [phone] After 6pm go to [email or web address] - Proofreader  [facility]  Office  [phone]  CC: Primary care physician; Nobie Putnam, DO

## 2016-02-28 NOTE — Care Management (Signed)
Patient is currently followed by [facility] PT SW.  If stress test is negative, will discharge home today.  She was placed in observation for chest pain.  CM spoke with patient's daughter- Samantha Aguirre about transport home.  Her husband will have to come after he gets off work which could be 6pm.

## 2016-02-28 NOTE — Progress Notes (Signed)
Pt discharged to home via wc.  Instructions given to pt, RX escribed to [facility].  Questions answered.  No distress.

## 2016-02-28 NOTE — Plan of Care (Signed)
Problem: Cardiac: Goal: Ability to achieve and maintain adequate cardiovascular perfusion will improve Outcome: Progressing Stress test today   

## 2016-02-28 NOTE — Consult Note (Signed)
Reason for Consult: Angina coronary disease Referring Physician: Dr Anselm Jungling hospitalist, primary physician Nobie Putnam MD  Samantha Aguirre is an 71 y.o. female.  HPI: 71 year old female known coronary disease PCI and stent 2005 just Any She's Had History of Congestive Heart Failure Asthma Reflux Diastolic Heart Failure COPD Diabetes Hyperlipidemia Hypertension CVA with Seizures Who Presents with Unstable Anginal Symptoms. Patient states she started having pain just prior to admission midsternal no radiation moderate to severe has not had any significant chest pain in quite some time so his symptoms concerned (came to emergency room for admission and evaluation. Patient states that the pain was left-sided with radiation to the left arm chest pain has been ongoing for about 24 hours. Patient has been maintained on Aggrenox for CVA she has some residual mild left-sided) she is and gets around in a wheelchair mostly. Patient was seen in our office as an outpatient back in December and referred for functional study but at some point she had a stroke in the interim and a functional study was never pursued. Patient is currently pain-free resting in bed feels reasonably well no palpitations tachycardia no syncope no nausea vomiting no bleeding.  Past Medical History:  Diagnosis Date  . Acid reflux   . Arthritis    Bilateral knees, hands, feet  . Asthma   . Cancer ([place])   . Chest pain   . CHF (congestive heart failure) (HCC)    Diastolic CHF  . COPD (chronic obstructive pulmonary disease) ([place])   . Coronary artery disease   . Diabetes mellitus without complication ([place])   . Frequent headaches   . Heart attack   . High cholesterol   . HLD (hyperlipidemia)   . Hypertension   . MI (myocardial infarction)   . Seizures ([place])   . Skin cancer 2015   Suspected basal cell carcinoma on nose, surgically removed  . Stroke ([place])   . Thyroid disease   . Tremors of nervous system     Past  Surgical History:  Procedure Laterality Date  . ABDOMINAL HYSTERECTOMY  1976  . APPENDECTOMY  1980  . CAROTID ENDARTERECTOMY    . CHOLECYSTECTOMY  1980  . [place]  . KNEE SURGERY     left  . ROTATOR CUFF REPAIR     right  . TONSILLECTOMY  1950    Family History  Problem Relation Age of Onset  . Breast cancer Mother   . Heart disease Mother   . Stroke Mother   . Cancer Mother   . COPD Mother   . Heart disease Father   . Diabetes Father   . Stroke Father   . Alcohol abuse Sister   . Drug abuse Sister   . Stroke Sister   . Cancer Sister   . Mental illness Sister   . Heart disease Brother   . Arthritis Brother   . Diabetes Brother   . Heart disease Maternal Grandfather   . Heart disease Paternal Grandfather     Social History:  reports that she has been smoking Cigarettes.  She has a 12.50 pack-year smoking history. She uses smokeless tobacco. She reports that she does not drink alcohol or use drugs.  Allergies:  Allergies  Allergen Reactions  . Mushroom Extract Complex Anaphylaxis    Made patient "deathly sick"  . Atenolol     "MD took me off of it bc it was doing something wrong" Made patient HYPOTENSIVE  . Ivp Dye [Iodinated Diagnostic Agents] Itching  Pt injected with IV contrast.  5 min after injection pt complained of itching behind ear and on her abd.  . Morphine And Related Other (See Comments)    "stops my breathing" NEAR-RESPIRATORY ARREST  . Percocet [Oxycodone-Acetaminophen] Nausea And Vomiting and Other (See Comments)    Stomach pains  . Percodan [Oxycodone-Aspirin] Nausea And Vomiting and Other (See Comments)    Stomach pains  . Verapamil Other (See Comments)    " MD told me this was screwing me up" MAKES PATIENT HYPOTENSIVE    Medications: I have reviewed the patient's current medications.  Results for orders placed or performed during the hospital encounter of 02/27/16 (from the past 48 hour(s))  Basic metabolic panel      Status: Abnormal   Collection Time: 02/27/16 12:40 PM  Result Value Ref Range   Sodium 141 135 - 145 mmol/L   Potassium 3.5 3.5 - 5.1 mmol/L   Chloride 105 101 - 111 mmol/L   CO2 27 22 - 32 mmol/L   Glucose, Bld 102 (H) 65 - 99 mg/dL   BUN 13 6 - 20 mg/dL   Creatinine, Ser 0.94 0.44 - 1.00 mg/dL   Calcium 9.1 8.9 - 10.3 mg/dL   GFR calc non Af Amer 59 (L) >60 mL/min   GFR calc Af Amer >60 >60 mL/min    Comment: (NOTE) The eGFR has been calculated using the CKD EPI equation. This calculation has not been validated in all clinical situations. eGFR's persistently <60 mL/min signify possible Chronic Kidney Disease.    Anion gap 9 5 - 15  CBC     Status: Abnormal   Collection Time: 02/27/16 12:40 PM  Result Value Ref Range   WBC 7.1 3.6 - 11.0 K/uL   RBC 4.30 3.80 - 5.20 MIL/uL   Hemoglobin 12.8 12.0 - 16.0 g/dL   HCT 39.0 35.0 - 47.0 %   MCV 90.7 80.0 - 100.0 fL   MCH 29.8 26.0 - 34.0 pg   MCHC 32.8 32.0 - 36.0 g/dL   RDW 16.9 (H) 11.5 - 14.5 %   Platelets 237 150 - 440 K/uL  Troponin I     Status: None   Collection Time: 02/27/16 12:40 PM  Result Value Ref Range   Troponin I <0.03 <0.03 ng/mL  Influenza panel by PCR (type A & B)     Status: None   Collection Time: 02/27/16  1:11 PM  Result Value Ref Range   Influenza A By PCR NEGATIVE NEGATIVE   Influenza B By PCR NEGATIVE NEGATIVE    Comment: (NOTE) The Xpert Xpress Flu assay is intended as an aid in the diagnosis of  influenza and should not be used as a sole basis for treatment.  This  assay is FDA approved for nasopharyngeal swab specimens only. Nasal  washings and aspirates are unacceptable for Xpert Xpress Flu testing.   Troponin I     Status: None   Collection Time: 02/27/16  3:37 PM  Result Value Ref Range   Troponin I <0.03 <0.03 ng/mL  Troponin I     Status: None   Collection Time: 02/27/16  5:52 PM  Result Value Ref Range   Troponin I <0.03 <0.03 ng/mL  Glucose, capillary     Status: Abnormal    Collection Time: 02/27/16  9:16 PM  Result Value Ref Range   Glucose-Capillary 125 (H) 65 - 99 mg/dL  Troponin I     Status: None   Collection Time: 02/27/16 10:13 PM  Result Value Ref Range   Troponin I <0.03 <0.03 ng/mL  Troponin I     Status: None   Collection Time: 02/28/16  2:05 AM  Result Value Ref Range   Troponin I <0.03 <0.03 ng/mL  Basic metabolic panel     Status: Abnormal   Collection Time: 02/28/16  2:05 AM  Result Value Ref Range   Sodium 140 135 - 145 mmol/L   Potassium 2.8 (L) 3.5 - 5.1 mmol/L   Chloride 107 101 - 111 mmol/L   CO2 26 22 - 32 mmol/L   Glucose, Bld 135 (H) 65 - 99 mg/dL   BUN 13 6 - 20 mg/dL   Creatinine, Ser 0.67 0.44 - 1.00 mg/dL   Calcium 8.3 (L) 8.9 - 10.3 mg/dL   GFR calc non Af Amer >60 >60 mL/min   GFR calc Af Amer >60 >60 mL/min    Comment: (NOTE) The eGFR has been calculated using the CKD EPI equation. This calculation has not been validated in all clinical situations. eGFR's persistently <60 mL/min signify possible Chronic Kidney Disease.    Anion gap 7 5 - 15  CBC     Status: Abnormal   Collection Time: 02/28/16  2:05 AM  Result Value Ref Range   WBC 7.0 3.6 - 11.0 K/uL   RBC 3.56 (L) 3.80 - 5.20 MIL/uL   Hemoglobin 11.0 (L) 12.0 - 16.0 g/dL   HCT 31.6 (L) 35.0 - 47.0 %   MCV 88.9 80.0 - 100.0 fL   MCH 30.9 26.0 - 34.0 pg   MCHC 34.7 32.0 - 36.0 g/dL   RDW 16.7 (H) 11.5 - 14.5 %   Platelets 200 150 - 440 K/uL  Glucose, capillary     Status: Abnormal   Collection Time: 02/28/16  7:38 AM  Result Value Ref Range   Glucose-Capillary 108 (H) 65 - 99 mg/dL    Dg Chest 2 View  Result Date: 02/27/2016 CLINICAL DATA:  Onset of intermittent episodes of left-sided chest pain radiating into the left arm to the elbow 4 days ago. History of asthma - Celsius OPD, current smoker, coronary artery disease with previous MI. EXAM: CHEST  2 VIEW COMPARISON:  Portable chest x-ray of December 31, 2015. FINDINGS: The lungs are mildly hyperinflated.  There is no focal infiltrate. There is no pleural effusion. The heart is mildly enlarged but stable. The pulmonary vascularity is normal. There is calcification in the wall of the aortic arch. There is multilevel degenerative disc disease of the thoracic spine. IMPRESSION: Mild chronic bronchitic-smoking related changes. No acute pneumonia. Stable cardiomegaly without pulmonary edema. Thoracic aortic atherosclerosis. Electronically Signed   By: David  Martinique M.D.   On: 02/27/2016 14:11    Review of Systems  Constitutional: Positive for malaise/fatigue.  HENT: Positive for congestion.   Eyes: Negative.   Respiratory: Positive for cough and shortness of breath.   Cardiovascular: Positive for chest pain.  Gastrointestinal: Negative.   Genitourinary: Negative.   Musculoskeletal: Negative.   Skin: Negative.   Neurological: Positive for weakness.  Endo/Heme/Allergies: Negative.   Psychiatric/Behavioral: Negative.    Blood pressure 127/69, pulse (!) 47, temperature 97.6 F (36.4 C), temperature source Oral, resp. rate 18, height '5\' 1"'  (1.549 m), weight 80.7 kg (177 lb 14.4 oz), SpO2 98 %. Physical Exam  Nursing note and vitals reviewed. Constitutional: She is oriented to person, place, and time. She appears well-developed and well-nourished.  HENT:  Head: Normocephalic and atraumatic.  Eyes: Conjunctivae and EOM are normal. Pupils  are equal, round, and reactive to light.  Neck: Normal range of motion. Neck supple.  Cardiovascular: Normal rate and regular rhythm.   Murmur heard. Respiratory: Effort normal. She has decreased breath sounds in the right upper field, the right middle field, the right lower field, the left upper field, the left middle field and the left lower field.  GI: Soft. Bowel sounds are normal.  Musculoskeletal: Normal range of motion.  Neurological: She is alert and oriented to person, place, and time.  Skin: Skin is warm and dry.  Psychiatric: She has a normal mood and  affect.    Assessment/Plan: Chest pain Angina COPD History of CVA  Smoking Diabetes type 2 Hyperlipidemia Coronary artery disease Chronic low back pain Seizure Depression . Plan Agree to admit to telemetry Recommend continue for myocardial infarction follow-up EKGs and enzymes Continue current medical therapy including nitrates Proceed with Myoview for functional study evaluation Short-term anticoagulation with heparin is reasonable  History of seizure disorder continue conservative therapy Advised the patient patient to quit smoking sports secondary prevention Agree with statin therapy for hyperlipidemia Diabetes management and control Supportive therapy for DJD and back pain Consider cardiac catheter if symptoms persist or functional study abnormal  Anwar Sakata D Jujuan Dugo 02/28/2016, 8:03 AM

## 2016-02-28 NOTE — Progress Notes (Signed)
To nuclear medicine via bed 

## 2016-02-29 LAB — NM MYOCAR MULTI W/SPECT W/WALL MOTION / EF
CHL CUP NUCLEAR SDS: 0
CHL CUP RESTING HR STRESS: 41 {beats}/min
CHL CUP STRESS STAGE 1 HR: 47 {beats}/min
CHL CUP STRESS STAGE 3 SPEED: 0 mph
CHL CUP STRESS STAGE 4 HR: 61 {beats}/min
CSEPED: 1 min
CSEPEW: 1 METS
CSEPHR: 49 %
CSEPPMHR: 39 %
Exercise duration (sec): 1 s
LV sys vol: 45 mL
LVDIAVOL: 83 mL (ref 46–106)
MPHR: 148 {beats}/min
Peak HR: 59 {beats}/min
SRS: 3
SSS: 4
Stage 1 Grade: 0 %
Stage 1 Speed: 0 mph
Stage 2 Grade: 0 %
Stage 2 HR: 47 {beats}/min
Stage 2 Speed: 0 mph
Stage 3 Grade: 0 %
Stage 3 HR: 59 {beats}/min
Stage 4 Grade: 0 %
Stage 4 Speed: 0 mph
TID: 1.05

## 2016-03-04 DIAGNOSIS — I2510 Atherosclerotic heart disease of native coronary artery without angina pectoris: Secondary | ICD-10-CM | POA: Diagnosis not present

## 2016-03-04 DIAGNOSIS — J449 Chronic obstructive pulmonary disease, unspecified: Secondary | ICD-10-CM | POA: Diagnosis not present

## 2016-03-04 DIAGNOSIS — I110 Hypertensive heart disease with heart failure: Secondary | ICD-10-CM | POA: Diagnosis not present

## 2016-03-04 DIAGNOSIS — I5032 Chronic diastolic (congestive) heart failure: Secondary | ICD-10-CM | POA: Diagnosis not present

## 2016-03-04 DIAGNOSIS — I69354 Hemiplegia and hemiparesis following cerebral infarction affecting left non-dominant side: Secondary | ICD-10-CM | POA: Diagnosis not present

## 2016-03-04 DIAGNOSIS — E119 Type 2 diabetes mellitus without complications: Secondary | ICD-10-CM | POA: Diagnosis not present

## 2016-03-05 ENCOUNTER — Inpatient Hospital Stay: Payer: Medicare Other | Admitting: Family Medicine

## 2016-03-05 ENCOUNTER — Ambulatory Visit (INDEPENDENT_AMBULATORY_CARE_PROVIDER_SITE_OTHER): Payer: Medicare Other | Admitting: Family Medicine

## 2016-03-05 ENCOUNTER — Encounter: Payer: Self-pay | Admitting: Family Medicine

## 2016-03-05 VITALS — BP 122/68 | HR 55 | Temp 97.8°F | Resp 16 | Ht 61.0 in | Wt 178.0 lb

## 2016-03-05 DIAGNOSIS — E782 Mixed hyperlipidemia: Secondary | ICD-10-CM

## 2016-03-05 DIAGNOSIS — I639 Cerebral infarction, unspecified: Secondary | ICD-10-CM

## 2016-03-05 DIAGNOSIS — I2510 Atherosclerotic heart disease of native coronary artery without angina pectoris: Secondary | ICD-10-CM

## 2016-03-05 DIAGNOSIS — I10 Essential (primary) hypertension: Secondary | ICD-10-CM

## 2016-03-05 DIAGNOSIS — E038 Other specified hypothyroidism: Secondary | ICD-10-CM

## 2016-03-05 MED ORDER — ATORVASTATIN CALCIUM 80 MG PO TABS: 80.0000 mg | ORAL_TABLET | Freq: Every day | ORAL | 11 refills | Status: DC

## 2016-03-05 NOTE — Assessment & Plan Note (Signed)
Stable BP currently - Continue current regimen on increased dose Lisinopril 20mg  daily

## 2016-03-05 NOTE — Assessment & Plan Note (Signed)
Recently dramatically elevated TSH to 159 with acute stress in hospital - Continue increased dose Levothyroxine 136mcg daily (has not picked up, in interval advised to take 1.5 tabs of 150mcg daily) - Follow-up 6-8 weeks re-check TSH

## 2016-03-05 NOTE — Patient Instructions (Signed)
Thank you for coming in to clinic today.  1. Continue Home PT for now to improvement weakness - Continue Aggrenox for now - Follow-up with Cardiology to discuss Atrial Fibrillation and possible switch to Anticoagulation. Also discuss Heart Stress test results with some Congestive Heart Failure.  - Go ahead and schedule Neurology visit with [facility] to discuss recent stroke and anticoagulation  2. For Thyroid - Start taking 1.5 tablets of levothryoxine 179mcg, until you can get new rx 173mcg daily, let me know if need refill - We will need to re-check this blood test in 6-8 weeks  3. Refilled Lipitor 80mg  daily, take this and stop crestor, let me know if get muscle aches and can't tolerate it  Please schedule a follow-up appointment with Dr. Parks Ranger in 2-3 months for follow-up Thyroid, HTN, Atrial Fib  If you have any other questions or concerns, please feel free to call the clinic or send a message through [place]. You may also schedule an earlier appointment if necessary.  Nobie Putnam, DO [place]

## 2016-03-05 NOTE — Assessment & Plan Note (Signed)
Recent CP resolved after hospitalization, negative work-up for acute ischemia. Concern recent stress test 02/28/16 with reduced LVEF Stable, chronic history of CAD with MI >20 yr ago s/p 3 stents, recent cardiac ablation 2 yr ago. On anti platelet therapy aggrenox, statin Followed by [facility] - Dr Clayborn Bigness  Plan: 1. Continue current med management anti-platelet aggrenox, ACEi, increased statin recently 2. Advised to follow-up with Cardiology as scheduled within 1 week for review of stress test results with concern for reduced LVEF, concern new dx systolic CHF, prior had mild diastolic CHF

## 2016-03-05 NOTE — Assessment & Plan Note (Addendum)
Unclear if actual CVA, from review seems to have no evidence of remote or acute CVA on MRI, however has had recurrence of TIAs with lasting deficits concerning for CVA. Now improving with PT - Recent hospitalization with reported concern for possible AFib, not identified on recent EKG 02/27/16, my exam today seems to be normal rhythm, but possible faint ectopy may be irregular - HASBLED score 4, yearly rate 8.7% risk, high risk  Plan: 1. Continue f/u with [facility] Neurology Dr Melrose Nakayama 2. Remains on Aggrenox, no change after hospitalization. Discussion of risks / benefits of chronic anticoagulation reviewed again, mutual decision to hold off on this for now, await further evaluation by Cardiology to determine if Atrial Fibrillation or other etiology that may pre-dispose her to CVA. Also appreciate input from both Cardiology and Neurology regarding decision to start anticoagulation, my reservations are shared with patient with concern for fall risk (as evidenced by recent falls and even facial fractures) 3. Continue Lipitor 80mg  daily, sent refill (changed recently in hospital from rosuvastatin) 4. Follow-up with Cards, Neurology as planned after hospitalization

## 2016-03-05 NOTE — Assessment & Plan Note (Signed)
Concern with recent hospitalization for possible CVA, vs recurrent TIAs - with known CAD remains at risk with PAD  Plan: 1. Agree with recent change from Rosuvastatin 40 to Lipitor 80, if patient can tolerate, seems to be doing well so far, refilled for now +11 refills to continue 2. Continue Aggrenox, see discussion on future indications for chronic anticoagulation

## 2016-03-05 NOTE — Progress Notes (Signed)
Subjective:    Patient ID: Samantha Aguirre, female    DOB: 08-01-1945, 71 y.o.   MRN: [id number]  Samantha Aguirre is a 71 y.o. female presenting on 03/05/2016 for Hospitalization Follow-up (had TIA)   HPI   Hospitalizations x2 at [facility] #1 Admitted 02/19/16 - Discharged 02/21/16 - CVA, L sided weakness #2 Admit 02/27/16 - Discharged 02/28/16 - Chest pain  Follow-up - H/o CVA, TIAs - Last seen by me for this problem 11/14 with h/o TIAs / CVA, she was referred to [facility] Neurology to re-establish for further stroke prevention management. Seen by Dr Melrose Nakayama 12/05/15, concern for new TIAs, continued on Aggrenox 25/200mg  twice daily. She had repeat brain imaging with MRI / CTA, without new evidence of remote or acute CVA. Also concerns at that time for seizure vs stress spells, EEG was recommended but rescheduled. - Recently hospitalized on 2/6 to 2/8 for new onset left sided weakness concern for possible CVA, history of hemorrhagic stroke in past, not given tpa, MRI imaging without evidence of acute CVA, she was continued on aggrenox, changed from rosuvastatin to lipitor 80mg  daily, also had unremarkable carotid dopplers and ECHO with normal LVEF at that time, she was advised may need future anticoagulation with concern of Atrial Fibrillation - Currently she is doing well, with improved residual deficits, still admits maybe 50% improved in Left upper and lower extremity, continues to work with [facility] PT - Still taking Aggrenox, tolerating new Lipitor 80mg  without myalgias, has been on lipitor in past - Denies any new recurrence of weakness, slurred speech, numbness, tingling  Hypothyroidism - Previously on Levothyroxine 147mcg daily, in hospital TSH checked up to 159, she was increased to Synthyroid 157mcg daily, but has not been able to pick up this new rx yet. Still has plenty of 141mcg tabs.  Follow-up Chest Pain, S/p CAD with stents, cardiac ablation in past / REDUCED LVEF - Recent recurrent  hospitalization due to chest pains, had cardiac evaluation including cardiac stress test and negative labs with troponins, discharged unlikely ACS. She is established with [facility] Cardiology Dr Clayborn Bigness, last seen on 11/23/15 - Today reports doing well without residual chest pain, dyspnea or exertional symptoms - Additionally had NM Stress test in hospital that demonstrated reduced LVEF down to 30-40%, concern for systolic CHF - Has follow-up with Cardiology soon for hospital follow-up, will discuss Atrial fibrillation at that time  Social History  Substance Use Topics  . Smoking status: Current Every Day Smoker    Packs/day: 0.25    Years: 50.00    Types: Cigarettes  . Smokeless tobacco: Current User  . Alcohol use No    Review of Systems Per HPI unless specifically indicated above     Objective:    BP 122/68   Pulse (!) 55   Temp 97.8 F (36.6 C) (Oral)   Resp 16   Ht 5\' 1"  (1.549 m)   Wt 178 lb (80.7 kg)   SpO2 99%   BMI 33.63 kg/m   Wt Readings from Last 3 Encounters:  03/05/16 178 lb (80.7 kg)  02/27/16 177 lb 14.4 oz (80.7 kg)  02/19/16 181 lb 6 oz (82.3 kg)    Physical Exam  Constitutional: She is oriented to person, place, and time. She appears well-developed and well-nourished. No distress.  Chronically ill appearing elderly 71 yr female but currently well, comfortable, cooperative  HENT:  Head: Normocephalic and atraumatic.  Mouth/Throat: Oropharynx is clear and moist.  Resolved facial ecchymosis  Eyes: EOM  are normal. Pupils are equal, round, and reactive to light.  Neck: Normal range of motion. Neck supple.  Cardiovascular: Normal rate, normal heart sounds and intact distal pulses.   No murmur heard. Possible mild ectopy, but difficult to determine seems to be irregularly irregular  Pulmonary/Chest: Effort normal.  Musculoskeletal: She exhibits no edema.  Upper extremities Normal tone, moves symmetrical Stable upper extremity Left grip 4/5 and  Right grip 5/5 unchanged from last visit  Neurological: She is alert and oriented to person, place, and time. No cranial nerve deficit.  Distal sensation intact to light touch bilateral upper and lower extremities, into hand fingers all intact.  Improved gait.  Skin: Skin is warm and dry. No rash noted. She is not diaphoretic. No erythema.  Psychiatric: She has a normal mood and affect. Her behavior is normal.  Nursing note and vitals reviewed.    I have personally reviewed the following lab results from 02/2016 / Imaging.  Results for orders placed or performed during the hospital encounter of 02/27/16  NM Myocar Multi W/Spect W/Wall Motion / EF  Result Value Ref Range   Rest HR 41 bpm   Rest BP 120/67 mmHg   Exercise duration (sec) 1 sec   Percent HR 49 %   Exercise duration (min) 1 min   Estimated workload 1.0 METS   Peak HR 59 BPM   Peak BP  mmHg   MPHR 148 bpm   SSS 4    SRS 3    SDS 0    TID 1.05    LV sys vol 45 mL   LV dias vol 83 46 - 106 mL   Phase 1 name PREINFSN    Stage 1 Name SUPINE    Stage 1 Time 00:00:05    Stage 1 Speed 0.0 mph   Stage 1 Grade 0.0 %   Stage 1 HR 47 bpm   Phase 2 Name Infusion    Stage 2 Name DOSE 1    Stage 2 Attribute Baseline    Stage 2 Time 00:00:00    Stage 2 Speed 0.0 mph   Stage 2 Grade 0.0 %   Stage 2 HR 47 bpm   Phase 3 Name Infusion    Stage 3 Name DOSE 1    Stage 3 Attribute Peak    Stage 3 Time 00:01:01    Stage 3 Speed 0.0 mph   Stage 3 Grade 0.0 %   Stage 3 HR 59 bpm   Phase 4 Name POSTINFSN    Stage 4 Time 00:04:00    Stage 4 Speed 0.0 mph   Stage 4 Grade 0.0 %   Stage 4 HR 61 bpm   Percent of predicted max HR 39 %  Basic metabolic panel  Result Value Ref Range   Sodium 141 135 - 145 mmol/L   Potassium 3.5 3.5 - 5.1 mmol/L   Chloride 105 101 - 111 mmol/L   CO2 27 22 - 32 mmol/L   Glucose, Bld 102 (H) 65 - 99 mg/dL   BUN 13 6 - 20 mg/dL   Creatinine, Ser 0.94 0.44 - 1.00 mg/dL   Calcium 9.1 8.9 - 10.3  mg/dL   GFR calc non Af Amer 59 (L) >60 mL/min   GFR calc Af Amer >60 >60 mL/min   Anion gap 9 5 - 15  CBC  Result Value Ref Range   WBC 7.1 3.6 - 11.0 K/uL   RBC 4.30 3.80 - 5.20 MIL/uL   Hemoglobin  12.8 12.0 - 16.0 g/dL   HCT 39.0 35.0 - 47.0 %   MCV 90.7 80.0 - 100.0 fL   MCH 29.8 26.0 - 34.0 pg   MCHC 32.8 32.0 - 36.0 g/dL   RDW 16.9 (H) 11.5 - 14.5 %   Platelets 237 150 - 440 K/uL  Troponin I  Result Value Ref Range   Troponin I <0.03 <0.03 ng/mL  Influenza panel by PCR (type A & B)  Result Value Ref Range   Influenza A By PCR NEGATIVE NEGATIVE   Influenza B By PCR NEGATIVE NEGATIVE  Troponin I  Result Value Ref Range   Troponin I <0.03 <0.03 ng/mL  Troponin I  Result Value Ref Range   Troponin I <0.03 <0.03 ng/mL  Troponin I  Result Value Ref Range   Troponin I <0.03 <0.03 ng/mL  Basic metabolic panel  Result Value Ref Range   Sodium 140 135 - 145 mmol/L   Potassium 2.8 (L) 3.5 - 5.1 mmol/L   Chloride 107 101 - 111 mmol/L   CO2 26 22 - 32 mmol/L   Glucose, Bld 135 (H) 65 - 99 mg/dL   BUN 13 6 - 20 mg/dL   Creatinine, Ser 0.67 0.44 - 1.00 mg/dL   Calcium 8.3 (L) 8.9 - 10.3 mg/dL   GFR calc non Af Amer >60 >60 mL/min   GFR calc Af Amer >60 >60 mL/min   Anion gap 7 5 - 15  CBC  Result Value Ref Range   WBC 7.0 3.6 - 11.0 K/uL   RBC 3.56 (L) 3.80 - 5.20 MIL/uL   Hemoglobin 11.0 (L) 12.0 - 16.0 g/dL   HCT 31.6 (L) 35.0 - 47.0 %   MCV 88.9 80.0 - 100.0 fL   MCH 30.9 26.0 - 34.0 pg   MCHC 34.7 32.0 - 36.0 g/dL   RDW 16.7 (H) 11.5 - 14.5 %   Platelets 200 150 - 440 K/uL  Glucose, capillary  Result Value Ref Range   Glucose-Capillary 125 (H) 65 - 99 mg/dL  Troponin I  Result Value Ref Range   Troponin I <0.03 <0.03 ng/mL  Magnesium  Result Value Ref Range   Magnesium 2.0 1.7 - 2.4 mg/dL  TSH  Result Value Ref Range   TSH 159.000 (H) 0.350 - 4.500 uIU/mL  Lipid panel  Result Value Ref Range   Cholesterol 212 (H) 0 - 200 mg/dL   Triglycerides 286 (H)  <150 mg/dL   HDL 43 >40 mg/dL   Total CHOL/HDL Ratio 4.9 RATIO   VLDL 57 (H) 0 - 40 mg/dL   LDL Cholesterol 112 (H) 0 - 99 mg/dL  Basic metabolic panel  Result Value Ref Range   Sodium 138 135 - 145 mmol/L   Potassium 4.1 3.5 - 5.1 mmol/L   Chloride 108 101 - 111 mmol/L   CO2 24 22 - 32 mmol/L   Glucose, Bld 127 (H) 65 - 99 mg/dL   BUN 14 6 - 20 mg/dL   Creatinine, Ser 0.66 0.44 - 1.00 mg/dL   Calcium 8.6 (L) 8.9 - 10.3 mg/dL   GFR calc non Af Amer >60 >60 mL/min   GFR calc Af Amer >60 >60 mL/min   Anion gap 6 5 - 15  Magnesium  Result Value Ref Range   Magnesium 2.1 1.7 - 2.4 mg/dL  Phosphorus  Result Value Ref Range   Phosphorus 3.3 2.5 - 4.6 mg/dL  Glucose, capillary  Result Value Ref Range   Glucose-Capillary 108 (H) 65 -  99 mg/dL  Glucose, capillary  Result Value Ref Range   Glucose-Capillary 106 (H) 65 - 99 mg/dL  Glucose, capillary  Result Value Ref Range   Glucose-Capillary 123 (H) 65 - 99 mg/dL      Assessment & Plan:   Problem List Items Addressed This Visit    Other specified hypothyroidism    Recently dramatically elevated TSH to 159 with acute stress in hospital - Continue increased dose Levothyroxine 150mcg daily (has not picked up, in interval advised to take 1.5 tabs of 120mcg daily) - Follow-up 6-8 weeks re-check TSH      Hypertension    Stable BP currently - Continue current regimen on increased dose Lisinopril 20mg  daily      Relevant Medications   atorvastatin (LIPITOR) 80 MG tablet   HLD (hyperlipidemia)    Concern with recent hospitalization for possible CVA, vs recurrent TIAs - with known CAD remains at risk with PAD  Plan: 1. Agree with recent change from Rosuvastatin 40 to Lipitor 80, if patient can tolerate, seems to be doing well so far, refilled for now +11 refills to continue 2. Continue Aggrenox, see discussion on future indications for chronic anticoagulation      Relevant Medications   atorvastatin (LIPITOR) 80 MG tablet    CVA (cerebral vascular accident) ([place]) - Primary    Unclear if actual CVA, from review seems to have no evidence of remote or acute CVA on MRI, however has had recurrence of TIAs with lasting deficits concerning for CVA. Now improving with PT - Recent hospitalization with reported concern for possible AFib, not identified on recent EKG 02/27/16, my exam today seems to be normal rhythm, but possible faint ectopy may be irregular - HASBLED score 4, yearly rate 8.7% risk, high risk  Plan: 1. Continue f/u with [facility] Neurology Dr Melrose Nakayama 2. Remains on Aggrenox, no change after hospitalization. Discussion of risks / benefits of chronic anticoagulation reviewed again, mutual decision to hold off on this for now, await further evaluation by Cardiology to determine if Atrial Fibrillation or other etiology that may pre-dispose her to CVA. Also appreciate input from both Cardiology and Neurology regarding decision to start anticoagulation, my reservations are shared with patient with concern for fall risk (as evidenced by recent falls and even facial fractures) 3. Continue Lipitor 80mg  daily, sent refill (changed recently in hospital from rosuvastatin) 4. Follow-up with Cards, Neurology as planned after hospitalization      Relevant Medications   atorvastatin (LIPITOR) 80 MG tablet   Coronary artery disease    Recent CP resolved after hospitalization, negative work-up for acute ischemia. Concern recent stress test 02/28/16 with reduced LVEF Stable, chronic history of CAD with MI >20 yr ago s/p 3 stents, recent cardiac ablation 2 yr ago. On anti platelet therapy aggrenox, statin Followed by [facility]iology - Dr Clayborn Bigness  Plan: 1. Continue current med management anti-platelet aggrenox, ACEi, increased statin recently 2. Advised to follow-up with Cardiology as scheduled within 1 week for review of stress test results with concern for reduced LVEF, concern new dx systolic CHF, prior had mild  diastolic CHF      Relevant Medications   atorvastatin (LIPITOR) 80 MG tablet      Meds ordered this encounter  Medications  . atorvastatin (LIPITOR) 80 MG tablet    Sig: Take 1 tablet (80 mg total) by mouth daily at 6 PM.    Dispense:  30 tablet    Refill:  11  Follow up plan: Return in about 2 months (around 05/03/2016) for Thyroid, HTN, Atrial Fib.  Completed transportation paperwork to authorize patient to be [place] of [place] to [facility], returned to patient today.  Nobie Putnam, [facility] 03/05/2016, 1:17 PM

## 2016-03-06 DIAGNOSIS — I2510 Atherosclerotic heart disease of native coronary artery without angina pectoris: Secondary | ICD-10-CM | POA: Diagnosis not present

## 2016-03-06 DIAGNOSIS — I110 Hypertensive heart disease with heart failure: Secondary | ICD-10-CM | POA: Diagnosis not present

## 2016-03-06 DIAGNOSIS — I5032 Chronic diastolic (congestive) heart failure: Secondary | ICD-10-CM | POA: Diagnosis not present

## 2016-03-06 DIAGNOSIS — I69354 Hemiplegia and hemiparesis following cerebral infarction affecting left non-dominant side: Secondary | ICD-10-CM | POA: Diagnosis not present

## 2016-03-06 DIAGNOSIS — E119 Type 2 diabetes mellitus without complications: Secondary | ICD-10-CM | POA: Diagnosis not present

## 2016-03-06 DIAGNOSIS — J449 Chronic obstructive pulmonary disease, unspecified: Secondary | ICD-10-CM | POA: Diagnosis not present

## 2016-03-11 DIAGNOSIS — I2510 Atherosclerotic heart disease of native coronary artery without angina pectoris: Secondary | ICD-10-CM | POA: Diagnosis not present

## 2016-03-11 DIAGNOSIS — I69354 Hemiplegia and hemiparesis following cerebral infarction affecting left non-dominant side: Secondary | ICD-10-CM | POA: Diagnosis not present

## 2016-03-11 DIAGNOSIS — E119 Type 2 diabetes mellitus without complications: Secondary | ICD-10-CM | POA: Diagnosis not present

## 2016-03-11 DIAGNOSIS — I5032 Chronic diastolic (congestive) heart failure: Secondary | ICD-10-CM | POA: Diagnosis not present

## 2016-03-11 DIAGNOSIS — I110 Hypertensive heart disease with heart failure: Secondary | ICD-10-CM | POA: Diagnosis not present

## 2016-03-11 DIAGNOSIS — J449 Chronic obstructive pulmonary disease, unspecified: Secondary | ICD-10-CM | POA: Diagnosis not present

## 2016-03-13 DIAGNOSIS — J449 Chronic obstructive pulmonary disease, unspecified: Secondary | ICD-10-CM | POA: Diagnosis not present

## 2016-03-13 DIAGNOSIS — I5032 Chronic diastolic (congestive) heart failure: Secondary | ICD-10-CM | POA: Diagnosis not present

## 2016-03-13 DIAGNOSIS — E119 Type 2 diabetes mellitus without complications: Secondary | ICD-10-CM | POA: Diagnosis not present

## 2016-03-13 DIAGNOSIS — I110 Hypertensive heart disease with heart failure: Secondary | ICD-10-CM | POA: Diagnosis not present

## 2016-03-13 DIAGNOSIS — I69354 Hemiplegia and hemiparesis following cerebral infarction affecting left non-dominant side: Secondary | ICD-10-CM | POA: Diagnosis not present

## 2016-03-13 DIAGNOSIS — I2510 Atherosclerotic heart disease of native coronary artery without angina pectoris: Secondary | ICD-10-CM | POA: Diagnosis not present

## 2016-03-18 DIAGNOSIS — I69354 Hemiplegia and hemiparesis following cerebral infarction affecting left non-dominant side: Secondary | ICD-10-CM | POA: Diagnosis not present

## 2016-03-18 DIAGNOSIS — I2510 Atherosclerotic heart disease of native coronary artery without angina pectoris: Secondary | ICD-10-CM | POA: Diagnosis not present

## 2016-03-18 DIAGNOSIS — J449 Chronic obstructive pulmonary disease, unspecified: Secondary | ICD-10-CM | POA: Diagnosis not present

## 2016-03-18 DIAGNOSIS — E119 Type 2 diabetes mellitus without complications: Secondary | ICD-10-CM | POA: Diagnosis not present

## 2016-03-18 DIAGNOSIS — I5032 Chronic diastolic (congestive) heart failure: Secondary | ICD-10-CM | POA: Diagnosis not present

## 2016-03-18 DIAGNOSIS — I110 Hypertensive heart disease with heart failure: Secondary | ICD-10-CM | POA: Diagnosis not present

## 2016-03-19 DIAGNOSIS — I2510 Atherosclerotic heart disease of native coronary artery without angina pectoris: Secondary | ICD-10-CM | POA: Diagnosis not present

## 2016-03-19 DIAGNOSIS — I5032 Chronic diastolic (congestive) heart failure: Secondary | ICD-10-CM | POA: Diagnosis not present

## 2016-03-19 DIAGNOSIS — E119 Type 2 diabetes mellitus without complications: Secondary | ICD-10-CM | POA: Diagnosis not present

## 2016-03-19 DIAGNOSIS — I69354 Hemiplegia and hemiparesis following cerebral infarction affecting left non-dominant side: Secondary | ICD-10-CM | POA: Diagnosis not present

## 2016-03-19 DIAGNOSIS — J449 Chronic obstructive pulmonary disease, unspecified: Secondary | ICD-10-CM | POA: Diagnosis not present

## 2016-03-19 DIAGNOSIS — I110 Hypertensive heart disease with heart failure: Secondary | ICD-10-CM | POA: Diagnosis not present

## 2016-03-20 DIAGNOSIS — I208 Other forms of angina pectoris: Secondary | ICD-10-CM | POA: Diagnosis not present

## 2016-03-20 DIAGNOSIS — J449 Chronic obstructive pulmonary disease, unspecified: Secondary | ICD-10-CM | POA: Diagnosis not present

## 2016-03-20 DIAGNOSIS — E785 Hyperlipidemia, unspecified: Secondary | ICD-10-CM | POA: Diagnosis not present

## 2016-03-20 DIAGNOSIS — R569 Unspecified convulsions: Secondary | ICD-10-CM | POA: Diagnosis not present

## 2016-03-20 DIAGNOSIS — I2510 Atherosclerotic heart disease of native coronary artery without angina pectoris: Secondary | ICD-10-CM | POA: Diagnosis not present

## 2016-03-20 DIAGNOSIS — I4891 Unspecified atrial fibrillation: Secondary | ICD-10-CM | POA: Diagnosis not present

## 2016-03-20 DIAGNOSIS — I639 Cerebral infarction, unspecified: Secondary | ICD-10-CM | POA: Diagnosis not present

## 2016-03-20 DIAGNOSIS — F17200 Nicotine dependence, unspecified, uncomplicated: Secondary | ICD-10-CM | POA: Diagnosis not present

## 2016-03-20 DIAGNOSIS — E119 Type 2 diabetes mellitus without complications: Secondary | ICD-10-CM | POA: Diagnosis not present

## 2016-03-20 DIAGNOSIS — E079 Disorder of thyroid, unspecified: Secondary | ICD-10-CM | POA: Diagnosis not present

## 2016-03-20 DIAGNOSIS — Z955 Presence of coronary angioplasty implant and graft: Secondary | ICD-10-CM | POA: Diagnosis not present

## 2016-03-20 DIAGNOSIS — I10 Essential (primary) hypertension: Secondary | ICD-10-CM | POA: Diagnosis not present

## 2016-03-25 DIAGNOSIS — I110 Hypertensive heart disease with heart failure: Secondary | ICD-10-CM | POA: Diagnosis not present

## 2016-03-25 DIAGNOSIS — I5032 Chronic diastolic (congestive) heart failure: Secondary | ICD-10-CM | POA: Diagnosis not present

## 2016-03-25 DIAGNOSIS — I69354 Hemiplegia and hemiparesis following cerebral infarction affecting left non-dominant side: Secondary | ICD-10-CM | POA: Diagnosis not present

## 2016-03-25 DIAGNOSIS — J449 Chronic obstructive pulmonary disease, unspecified: Secondary | ICD-10-CM | POA: Diagnosis not present

## 2016-03-25 DIAGNOSIS — I2510 Atherosclerotic heart disease of native coronary artery without angina pectoris: Secondary | ICD-10-CM | POA: Diagnosis not present

## 2016-03-25 DIAGNOSIS — E119 Type 2 diabetes mellitus without complications: Secondary | ICD-10-CM | POA: Diagnosis not present

## 2016-03-27 DIAGNOSIS — I110 Hypertensive heart disease with heart failure: Secondary | ICD-10-CM | POA: Diagnosis not present

## 2016-03-27 DIAGNOSIS — J449 Chronic obstructive pulmonary disease, unspecified: Secondary | ICD-10-CM | POA: Diagnosis not present

## 2016-03-27 DIAGNOSIS — I5032 Chronic diastolic (congestive) heart failure: Secondary | ICD-10-CM | POA: Diagnosis not present

## 2016-03-27 DIAGNOSIS — I69354 Hemiplegia and hemiparesis following cerebral infarction affecting left non-dominant side: Secondary | ICD-10-CM | POA: Diagnosis not present

## 2016-03-27 DIAGNOSIS — I2510 Atherosclerotic heart disease of native coronary artery without angina pectoris: Secondary | ICD-10-CM | POA: Diagnosis not present

## 2016-03-27 DIAGNOSIS — E119 Type 2 diabetes mellitus without complications: Secondary | ICD-10-CM | POA: Diagnosis not present

## 2016-04-04 DIAGNOSIS — I69354 Hemiplegia and hemiparesis following cerebral infarction affecting left non-dominant side: Secondary | ICD-10-CM | POA: Diagnosis not present

## 2016-04-04 DIAGNOSIS — I2510 Atherosclerotic heart disease of native coronary artery without angina pectoris: Secondary | ICD-10-CM | POA: Diagnosis not present

## 2016-04-04 DIAGNOSIS — E119 Type 2 diabetes mellitus without complications: Secondary | ICD-10-CM | POA: Diagnosis not present

## 2016-04-04 DIAGNOSIS — J449 Chronic obstructive pulmonary disease, unspecified: Secondary | ICD-10-CM | POA: Diagnosis not present

## 2016-04-04 DIAGNOSIS — I110 Hypertensive heart disease with heart failure: Secondary | ICD-10-CM | POA: Diagnosis not present

## 2016-04-04 DIAGNOSIS — I5032 Chronic diastolic (congestive) heart failure: Secondary | ICD-10-CM | POA: Diagnosis not present

## 2016-04-18 ENCOUNTER — Emergency Department (HOSPITAL_COMMUNITY): Payer: Medicare Other

## 2016-04-18 ENCOUNTER — Emergency Department (HOSPITAL_COMMUNITY)
Admission: EM | Admit: 2016-04-18 | Discharge: 2016-04-18 | Disposition: A | Discharge status: Home / Self Care | Payer: Medicare Other | Attending: Emergency Medicine | Admitting: Emergency Medicine

## 2016-04-18 ENCOUNTER — Encounter (HOSPITAL_COMMUNITY): Payer: Self-pay

## 2016-04-18 DIAGNOSIS — I110 Hypertensive heart disease with heart failure: Secondary | ICD-10-CM | POA: Diagnosis not present

## 2016-04-18 DIAGNOSIS — Z85828 Personal history of other malignant neoplasm of skin: Secondary | ICD-10-CM | POA: Diagnosis not present

## 2016-04-18 DIAGNOSIS — F17210 Nicotine dependence, cigarettes, uncomplicated: Secondary | ICD-10-CM | POA: Diagnosis not present

## 2016-04-18 DIAGNOSIS — J449 Chronic obstructive pulmonary disease, unspecified: Secondary | ICD-10-CM | POA: Insufficient documentation

## 2016-04-18 DIAGNOSIS — I5030 Unspecified diastolic (congestive) heart failure: Secondary | ICD-10-CM | POA: Insufficient documentation

## 2016-04-18 DIAGNOSIS — I2510 Atherosclerotic heart disease of native coronary artery without angina pectoris: Secondary | ICD-10-CM | POA: Diagnosis not present

## 2016-04-18 DIAGNOSIS — E1140 Type 2 diabetes mellitus with diabetic neuropathy, unspecified: Secondary | ICD-10-CM | POA: Diagnosis not present

## 2016-04-18 DIAGNOSIS — Z7984 Long term (current) use of oral hypoglycemic drugs: Secondary | ICD-10-CM | POA: Insufficient documentation

## 2016-04-18 DIAGNOSIS — I252 Old myocardial infarction: Secondary | ICD-10-CM | POA: Diagnosis not present

## 2016-04-18 DIAGNOSIS — G8191 Hemiplegia, unspecified affecting right dominant side: Secondary | ICD-10-CM | POA: Diagnosis not present

## 2016-04-18 DIAGNOSIS — Z8673 Personal history of transient ischemic attack (TIA), and cerebral infarction without residual deficits: Secondary | ICD-10-CM | POA: Diagnosis not present

## 2016-04-18 DIAGNOSIS — I6789 Other cerebrovascular disease: Secondary | ICD-10-CM | POA: Diagnosis not present

## 2016-04-18 DIAGNOSIS — G43B0 Ophthalmoplegic migraine, not intractable: Secondary | ICD-10-CM

## 2016-04-18 DIAGNOSIS — G43109 Migraine with aura, not intractable, without status migrainosus: Secondary | ICD-10-CM | POA: Diagnosis not present

## 2016-04-18 DIAGNOSIS — I10 Essential (primary) hypertension: Secondary | ICD-10-CM | POA: Diagnosis not present

## 2016-04-18 DIAGNOSIS — R29810 Facial weakness: Secondary | ICD-10-CM

## 2016-04-18 DIAGNOSIS — H5711 Ocular pain, right eye: Secondary | ICD-10-CM | POA: Diagnosis present

## 2016-04-18 LAB — I-STAT CHEM 8, ED
BUN: 10 mg/dL (ref 6–20)
CALCIUM ION: 0.98 mmol/L — AB (ref 1.15–1.40)
Chloride: 109 mmol/L (ref 101–111)
Creatinine, Ser: 0.7 mg/dL (ref 0.44–1.00)
GLUCOSE: 80 mg/dL (ref 65–99)
HCT: 38 % (ref 36.0–46.0)
HEMOGLOBIN: 12.9 g/dL (ref 12.0–15.0)
POTASSIUM: 4.1 mmol/L (ref 3.5–5.1)
SODIUM: 140 mmol/L (ref 135–145)
TCO2: 24 mmol/L (ref 0–100)

## 2016-04-18 LAB — COMPREHENSIVE METABOLIC PANEL
ALT: 12 U/L — AB (ref 14–54)
ANION GAP: 10 (ref 5–15)
AST: 21 U/L (ref 15–41)
Albumin: 4 g/dL (ref 3.5–5.0)
Alkaline Phosphatase: 74 U/L (ref 38–126)
BUN: 7 mg/dL (ref 6–20)
CALCIUM: 9 mg/dL (ref 8.9–10.3)
CHLORIDE: 109 mmol/L (ref 101–111)
CO2: 21 mmol/L — ABNORMAL LOW (ref 22–32)
CREATININE: 0.75 mg/dL (ref 0.44–1.00)
GFR calc Af Amer: >60 mL/min (ref >60)
Glucose, Bld: 78 mg/dL (ref 65–99)
Potassium: 3.3 mmol/L — ABNORMAL LOW (ref 3.5–5.1)
SODIUM: 140 mmol/L (ref 135–145)
Total Bilirubin: 0.8 mg/dL (ref 0.3–1.2)
Total Protein: 6.8 g/dL (ref 6.5–8.1)

## 2016-04-18 LAB — CBC
HEMATOCRIT: 37.9 % (ref 36.0–46.0)
Hemoglobin: 12.2 g/dL (ref 12.0–15.0)
MCH: 29.9 pg (ref 26.0–34.0)
MCHC: 32.2 g/dL (ref 30.0–36.0)
MCV: 92.9 fL (ref 78.0–100.0)
PLATELETS: 199 10*3/uL (ref 150–400)
RBC: 4.08 MIL/uL (ref 3.87–5.11)
RDW: 15.4 % (ref 11.5–15.5)
WBC: 6.1 10*3/uL (ref 4.0–10.5)

## 2016-04-18 LAB — DIFFERENTIAL
BASOS PCT: 1 %
Basophils Absolute: 0 10*3/uL (ref 0.0–0.1)
EOS PCT: 1 %
Eosinophils Absolute: 0.1 10*3/uL (ref 0.0–0.7)
Lymphocytes Relative: 42 %
Lymphs Abs: 2.5 10*3/uL (ref 0.7–4.0)
MONO ABS: 0.4 10*3/uL (ref 0.1–1.0)
MONOS PCT: 6 %
NEUTROS ABS: 3.1 10*3/uL (ref 1.7–7.7)
Neutrophils Relative %: 50 %

## 2016-04-18 LAB — I-STAT TROPONIN, ED: TROPONIN I, POC: 0 ng/mL (ref 0.00–0.08)

## 2016-04-18 LAB — PROTIME-INR
INR: 0.99
PROTHROMBIN TIME: 13.1 s (ref 11.4–15.2)

## 2016-04-18 LAB — APTT: aPTT: 26 seconds (ref 24–36)

## 2016-04-18 MED ORDER — ACETAMINOPHEN 325 MG PO TABS
650.0000 mg | ORAL_TABLET | Freq: Once | ORAL | Status: AC
  Administered 2016-04-18: 650 mg via ORAL
  Filled 2016-04-18: qty 2

## 2016-04-18 MED ORDER — PROCHLORPERAZINE EDISYLATE 5 MG/ML IJ SOLN
10.0000 mg | Freq: Once | INTRAMUSCULAR | Status: AC
  Administered 2016-04-18: 10 mg via INTRAVENOUS
  Filled 2016-04-18: qty 2

## 2016-04-18 NOTE — Discharge Instructions (Signed)
Continue your current medications, follow up with your doctor, return to the ED for recurrent symptoms, weakness

## 2016-04-18 NOTE — ED Provider Notes (Addendum)
[facility] DEPT Provider Note   CSN: [id number] Arrival date & time: 04/18/16  1319   An emergency department physician performed an initial assessment on this suspected stroke patient at 24.  History   Chief Complaint Chief Complaint  Patient presents with  . Code Stroke    HPI Samantha Aguirre is a 71 y.o. female.  HPI Patient presented to the emergency room with complaints of possible right sided facial droop and right eye pain.  The patient has a history of stroke. She is currently on eliquis for atrial fibrillation. Patient states that about 11:15 AM she developed a headache associated with right eye pain. She was also concerned about the possibility also mild right-sided weakness. She walked back to her daughter's home and then called 911. Patient was brought into the emergency room as a code stroke. The patient's symptoms have mostly resolved.  Patient had a similar event back in August 2017 when she had right-sided weakness. Patient had an MRI at that time that did not show any acute abnormalities. Past Medical History:  Diagnosis Date  . Acid reflux   . Arthritis    Bilateral knees, hands, feet  . Asthma   . Cancer ([place])   . Chest pain   . CHF (congestive heart failure) (H[facility])    Diastolic CHF  . COPD (chronic obstructive pulmonary disease) ([place])   . Coronary artery disease   . Diabetes mellitus without complication ([place])   . Frequent headaches   . Heart attack   . High cholesterol   . HLD (hyperlipidemia)   . Hypertension   . MI (myocardial infarction)   . Seizures ([place])   . Skin cancer 2015   Suspected basal cell carcinoma on nose, surgically removed  . Stroke ([place])   . Thyroid disease   . Tremors of nervous system     Patient Active Problem List   Diagnosis Date Noted  . Chest pain 02/27/2016  . CVA (cerebral vascular accident) ([place]) 02/19/2016  . Closed fracture of face bones due to fall ([place]) 01/22/2016  . Thumb pain, left 01/22/2016  . Traumatic  closed nondisplaced fracture of base of metacarpal bone of left thumb 01/22/2016  . Colon cancer screening 11/27/2015  . Depression 11/14/2015  . Coronary artery disease 11/13/2015  . Hypertension 11/13/2015  . History of skin cancer 11/13/2015  . Osteoporosis 11/13/2015  . Chronic low back pain 11/13/2015  . Left medial knee pain 11/13/2015  . Osteoarthritis of multiple joints 11/13/2015  . Cerebral infarction ([place]) 09/16/2015  . CVA, old, facial weakness   . Right sided weakness 08/16/2015  . Other specified hypothyroidism 08/16/2015  . Controlled type 2 diabetes mellitus with diabetic neuropathy ([place]) 08/16/2015  . COPD (chronic obstructive pulmonary disease) ([place]) 08/16/2015  . Tobacco abuse 08/16/2015  . HLD (hyperlipidemia) 08/16/2015  . History of CHF (congestive heart failure) 08/16/2015  . Seizures ([place])     Past Surgical History:  Procedure Laterality Date  . ABDOMINAL HYSTERECTOMY  1976  . APPENDECTOMY  1980  . CAROTID ENDARTERECTOMY    . CHOLECYSTECTOMY  1980  . [place]  . KNEE SURGERY     left  . ROTATOR CUFF REPAIR     right  . TONSILLECTOMY  1950    OB History    No data available       Home Medications    Prior to Admission medications   Medication Sig Start Date End Date Taking? Authorizing Provider  acetaminophen (TYLENOL) 325 MG  tablet Take 650 mg by mouth at bedtime.    Historical Provider, MD  albuterol (PROVENTIL HFA;VENTOLIN HFA) 108 (90 Base) MCG/ACT inhaler Inhale 2 puffs into the lungs every 6 (six) hours as needed for wheezing or shortness of breath. 01/01/16   Lytle Butte, MD  atorvastatin (LIPITOR) 80 MG tablet Take 1 tablet (80 mg total) by mouth daily at 6 PM. 03/05/16   Olin Hauser, DO  baclofen (LIORESAL) 10 MG tablet Take 0.5-1 tablets (5-10 mg total) by mouth 3 (three) times daily as needed for muscle spasms. 11/13/15   Olin Hauser, DO  diclofenac sodium (VOLTAREN) 1 % GEL Apply 2 g  topically 4 (four) times daily. 01/11/16   Daymon Larsen, MD  dipyridamole-aspirin (AGGRENOX) 200-25 MG 12hr capsule Take 1 capsule by mouth 2 (two) times daily. 09/19/15   Epifanio Lesches, MD  DULoxetine (CYMBALTA) 60 MG capsule Take 1 capsule (60 mg total) by mouth at bedtime. 11/13/15   Devonne Doughty Karamalegos, DO  fluticasone (FLONASE) 50 MCG/ACT nasal spray Place 1 spray into both nostrils daily. 01/02/16   Lytle Butte, MD  fluticasone furoate-vilanterol (BREO ELLIPTA) 100-25 MCG/INH AEPB Inhale 1 puff into the lungs daily. 11/13/15   Olin Hauser, DO  gabapentin (NEURONTIN) 800 MG tablet Take 1 tablet (800 mg total) by mouth 2 (two) times daily. 11/13/15   Devonne Doughty Karamalegos, DO  glipiZIDE-metformin (METAGLIP) 5-500 MG tablet Take 1-2 tablets by mouth See admin instructions. 2 tablets in the morning and 1 tablet at night 11/13/15   Olin Hauser, DO  guaiFENesin (MUCINEX) 600 MG 12 hr tablet Take 1 tablet (600 mg total) by mouth 2 (two) times daily as needed for cough or to loosen phlegm. 01/01/16   Lytle Butte, MD  levETIRAcetam (KEPPRA) 500 MG tablet Take 1 tablet (500 mg total) by mouth 2 (two) times daily. 09/19/15   Epifanio Lesches, MD  levothyroxine (SYNTHROID, LEVOTHROID) 175 MCG tablet Take 1 tablet (175 mcg total) by mouth daily before breakfast. 02/29/16   Bettey Costa, MD  lidocaine (LIDODERM) 5 % Place 1 patch onto the skin daily. 01/11/16   Daymon Larsen, MD  lisinopril (PRINIVIL,ZESTRIL) 20 MG tablet Take 1 tablet (20 mg total) by mouth daily. 11/27/15   Olin Hauser, DO  nitroGLYCERIN (NITROSTAT) 0.4 MG SL tablet Place 1 tablet (0.4 mg total) under the tongue every 5 (five) minutes as needed for chest pain. 02/28/16   Bettey Costa, MD  umeclidinium bromide (INCRUSE ELLIPTA) 62.5 MCG/INH AEPB Inhale 1 puff into the lungs daily. 11/13/15   Olin Hauser, DO    Family History Family History  Problem Relation Age of Onset  .  Breast cancer Mother   . Heart disease Mother   . Stroke Mother   . Cancer Mother   . COPD Mother   . Heart disease Father   . Diabetes Father   . Stroke Father   . Alcohol abuse Sister   . Drug abuse Sister   . Stroke Sister   . Cancer Sister   . Mental illness Sister   . Heart disease Brother   . Arthritis Brother   . Diabetes Brother   . Heart disease Maternal Grandfather   . Heart disease Paternal Grandfather     Social History Social History  Substance Use Topics  . Smoking status: Current Every Day Smoker    Packs/day: 0.25    Years: 50.00    Types: Cigarettes  . Smokeless  tobacco: Current User  . Alcohol use No     Allergies   Mushroom extract complex; Atenolol; Ivp dye [iodinated diagnostic agents]; Morphine and related; Percocet [oxycodone-acetaminophen]; Percodan [oxycodone-aspirin]; and Verapamil   Review of Systems Review of Systems  All other systems reviewed and are negative.    Physical Exam Updated Vital Signs BP (!) 176/82   Pulse (!) 51   Temp 98.4 F (36.9 C) (Oral)   Resp 15   Wt 83 kg   SpO2 99%   BMI 34.57 kg/m   Physical Exam  Constitutional: No distress.  HENT:  Head: Normocephalic and atraumatic.  Right Ear: External ear normal.  Left Ear: External ear normal.  Eyes: Conjunctivae are normal. Right eye exhibits no discharge. Left eye exhibits no discharge. No scleral icterus.  Neck: Neck supple. No tracheal deviation present.  Cardiovascular: Normal rate, regular rhythm and intact distal pulses.   Pulmonary/Chest: Effort normal and breath sounds normal. No stridor. No respiratory distress. She has no wheezes. She has no rales.  Abdominal: Soft. Bowel sounds are normal. She exhibits no distension. There is no tenderness. There is no rebound and no guarding.  Musculoskeletal: She exhibits no edema or tenderness.  Neurological: She is alert. She has normal strength. No cranial nerve deficit (no facial droop, extraocular movements  intact, no slurred speech) or sensory deficit. She exhibits normal muscle tone. She displays no seizure activity. Coordination normal.  Skin: Skin is warm and dry. No rash noted.  Psychiatric: She has a normal mood and affect.  Nursing note and vitals reviewed.    ED Treatments / Results  Labs (all labs ordered are listed, but only abnormal results are displayed) Labs Reviewed  COMPREHENSIVE METABOLIC PANEL - Abnormal; Notable for the following:       Result Value   Potassium 3.3 (*)    CO2 21 (*)    ALT 12 (*)    All other components within normal limits  I-STAT CHEM 8, ED - Abnormal; Notable for the following:    Calcium, Ion 0.98 (*)    All other components within normal limits  PROTIME-INR  APTT  CBC  DIFFERENTIAL  I-STAT TROPOININ, ED  CBG MONITORING, ED    EKG  EKG Interpretation  Date/Time:  Friday April 18 2016 13:37:22 EDT Ventricular Rate:  52 PR Interval:    QRS Duration: 82 QT Interval:  463 QTC Calculation: 427 R Axis:   -26 Text Interpretation:  Sinus rhythm Prolonged PR interval Borderline left axis deviation Anterior infarct, old No significant change since last tracing Confirmed by Adysson Revelle  MD-J, Teandre Hamre ([id number]) on 04/18/2016 1:57:54 PM       Radiology Ct Head Code Stroke W/o Cm  Result Date: 04/18/2016 CLINICAL DATA:  Code stroke. Right hemiparesis. Right facial droop. EXAM: CT HEAD WITHOUT CONTRAST TECHNIQUE: Contiguous axial images were obtained from the base of the skull through the vertex without intravenous contrast. COMPARISON:  02/19/2016 FINDINGS: Brain: No acute finding. Old small vessel cerebellar infarctions. Chronic small-vessel ischemic changes throughout the cerebral hemispheric white matter. No cortical or large vessel territory infarction. No mass lesion, hemorrhage, hydrocephalus or extra-axial collection. Vascular: There is atherosclerotic calcification of the major vessels at the base of the brain. Skull: Normal Sinuses/Orbits: Clear/ normal  Other: None significant ASPECTS ([place] Stroke Program Early CT Score) - Ganglionic level infarction (caudate, lentiform nuclei, internal capsule, insula, M1-M3 cortex): 7 - Supraganglionic infarction (M4-M6 cortex): 3 Total score (0-10 with 10 being normal): 10 IMPRESSION: 1. No acute  finding. Chronic small-vessel ischemic changes as seen previously. 2. ASPECTS is 10 Page was placed at the time of interpretation on 04/18/2016 at 1:37 pm to Dr. Shon Hale. Electronically Signed   By: Nelson Chimes M.D.   On: 04/18/2016 13:39    Procedures Procedures (including critical care time)  Medications Ordered in ED Medications  prochlorperazine (COMPAZINE) injection 10 mg (10 mg Intravenous Given 04/18/16 1459)  acetaminophen (TYLENOL) tablet 650 mg (650 mg Oral Given 04/18/16 1459)     Initial Impression / Assessment and Plan / ED Course  I have reviewed the triage vital signs and the nursing notes.  Pertinent labs & imaging results that were available during my care of the patient were reviewed by me and considered in my medical decision making (see chart for details).   patient was brought into the emergency room as a code stroke. She was assessed by the neurology stroke team including personally by Dr. Shon Hale. The patient's code stroke was canceled. Neurology felt that her symptoms were likely related to her headache, most likely a complex migraine. Patient was treated in the emergency room with compazine and acetaminophen.  Her symptoms have resolved.    Final Clinical Impressions(s) / ED Diagnoses   Final diagnoses:  Ophthalmoplegic migraine, not intractable    New Prescriptions New Prescriptions   No medications on file     Dorie Rank, MD 04/18/16 1544  Dictation errors corrected    Dorie Rank, MD 04/20/16 1148

## 2016-04-18 NOTE — Code Documentation (Signed)
71 y.o. Female w/ Hx of CVA, seizures, HTN, HLD, DM, headaches, CAD, MI, CHF, COPD and current tobacco use. Pt from home where she lives with her daughter. She was stated to be in her normal state of health this morning. The daughter reports leaving the house this morning and the pt was not altered. Upon returning to the home, the pt was found to have RIGHT sided hemiparesis and a RIGHT sided facial droop. EMS was notified and pt brought to [facility] in code stroke fashion. Upon arrival, labs were drawn and pt taken to CT. CT (-) ASPECTS 10. tPA not given d/t pt being on Eliquous. NIHSS 2. VAN (-). Not a thrombectomy candidate. On assessment, pt unable to state the month and has a decrease in sensation to the RUE. Code stroke canceled per neurologist. Bedside handoff w/ ED RN Rod Holler.

## 2016-04-18 NOTE — Consult Note (Signed)
Referring Physician: Dr Tomi Bamberger    Chief Complaint: Code Stroke  HPI: Samantha Aguirre is an 71 y.o. female with a history of previous strokes, seizures, coronary artery disease, Mi,  gastroesophageal reflux disease, asthma / COPD, ongoing tobacco use, congestive heart failure, diabetes mellitus, hyperlipidemia, hypertension, recently diagnosed atrial fibrillation (on Eliquis), depression, and thyroid disease who was in her normal state of health this morning. She was visiting a neighbor when at approximately 11:15 AM she developed a severe headache, right eye pain, drooping of the right eye, and poor balance. She also felt she had mild right-sided weakness. She was able to walk back to her daughter's home and 911 was called. She was brought to [facility] emergency department as a code stroke. A CT scan in the emergency department was unremarkable for acute findings. Most of her deficits had resolved. She had an NIH of 2 the headache persisted and this will be addressed by the emergency department physician. As noted she was recently diagnosed with atrial fibrillation and placed on Eliquis. She states that she has been compliant with the Eliquis since she was able to pick up the medication from the pharmacy. Apparently this was some what delayed from the time it was actually prescribed. The patient's last admission to [facility] was in August 2017 at which time she had increased right-sided weakness. An MRI at that time showed no acute abnormalities. She was admitted to [facility] on 02/27/2016 for evaluation of chest pain. She was discharged the following day with a diagnosis of angina. She admits that she continues to smoke one half pack cigarettes per day. She was encouraged to quit.  Date last known well: Date: 04/18/2016 Time last known well: Time: 11:15 tPA Given: No:  Minimal deficits - Eliquis  Past Medical History:  Diagnosis Date  . Acid reflux   . Arthritis    Bilateral knees, hands, feet  . Asthma   . Cancer ([place])   . Chest pain   . CHF (congestive heart failure) (HCC)    Diastolic CHF  . COPD (chronic obstructive pulmonary disease) ([place])   . Coronary artery disease   . Diabetes mellitus without complication ([place])   . Frequent headaches   . Heart attack   . High cholesterol   . HLD (hyperlipidemia)   . Hypertension   . MI (myocardial infarction)   . Seizures ([place])   . Skin cancer 2015   Suspected basal cell carcinoma on nose, surgically removed  . Stroke ([place])   . Thyroid disease   . Tremors of nervous system     Past Surgical History:  Procedure Laterality Date  . ABDOMINAL HYSTERECTOMY  1976  . APPENDECTOMY  1980  . CAROTID ENDARTERECTOMY    . CHOLECYSTECTOMY  1980  . [place]  . KNEE SURGERY     left  . ROTATOR CUFF REPAIR     right  . TONSILLECTOMY  1950    Family History  Problem Relation Age of Onset  . Breast cancer Mother   . Heart disease Mother   . Stroke Mother   . Cancer Mother   . COPD Mother   . Heart disease Father   . Diabetes Father   . Stroke Father   . Alcohol abuse Sister   . Drug abuse Sister   . Stroke Sister   . Cancer Sister   . Mental illness Sister   . Heart disease Brother   . Arthritis Brother   .  Diabetes Brother   . Heart disease Maternal Grandfather   . Heart disease Paternal Grandfather    Social History:  reports that she has been smoking Cigarettes.  She has a 12.50 pack-year smoking history. She uses smokeless tobacco. She reports that she does not drink alcohol or use drugs.  Allergies:  Allergies  Allergen Reactions  . Mushroom Extract Complex Anaphylaxis    Made patient "deathly sick"  . Atenolol     "MD took me off of it bc it was doing something wrong" Made patient HYPOTENSIVE  . Ivp Dye [Iodinated Diagnostic Agents] Itching    Pt injected with IV contrast.  5 min after injection pt complained of itching behind ear and on her abd.  . Morphine And  Related Other (See Comments)    "stops my breathing" NEAR-RESPIRATORY ARREST  . Percocet [Oxycodone-Acetaminophen] Nausea And Vomiting and Other (See Comments)    Stomach pains  . Percodan [Oxycodone-Aspirin] Nausea And Vomiting and Other (See Comments)    Stomach pains  . Verapamil Other (See Comments)    " MD told me this was screwing me up" MAKES PATIENT HYPOTENSIVE      ROS: History obtained from the patient   General ROS: negative for - chills, fatigue, fever, night sweats, weight gain or weight loss Psychological ROS: negative for - behavioral disorder, hallucinations, memory difficulties, mood swings or suicidal ideation Ophthalmic ROS: Positive for recent blurred vision. ENT ROS: negative for - epistaxis, nasal discharge, oral lesions, sore throat, tinnitus or vertigo Allergy and Immunology ROS: negative for - hives or itchy/watery eyes Hematological and Lymphatic ROS: negative for - bleeding problems, bruising or swollen lymph nodes Endocrine ROS: negative for - galactorrhea, hair pattern changes, polydipsia/polyuria or temperature intolerance Respiratory ROS: Positive for chronic shortness of breath. Cardiovascular ROS: Positive for exertional chest pain. Recent diagnosis of atrial fibrillation. Gastrointestinal ROS: negative for - abdominal pain, diarrhea, hematemesis, nausea/vomiting or stool incontinence Genito-Urinary ROS: negative for - dysuria, hematuria, incontinence or urinary frequency/urgency Musculoskeletal ROS: negative for - joint swelling or muscular weakness Neurological ROS: as noted in HPI Dermatological ROS: negative for rash and skin lesion changes    Physical Examination: Blood pressure (!) 176/82, pulse (!) 51, temperature 98.4 F (36.9 C), temperature source Oral, resp. rate 15, weight 83 kg (182 lb 15.7 oz), SpO2 99 %.  See attending note  Laboratory Studies:  Basic Metabolic Panel:  Recent Labs Lab 04/18/16 1323 04/18/16 1329  NA 140  140  K 3.3* 4.1  CL 109 109  CO2 21*  --   GLUCOSE 78 80  BUN 7 10  CREATININE 0.75 0.70  CALCIUM 9.0  --     Liver Function Tests:  Recent Labs Lab 04/18/16 1323  AST 21  ALT 12*  ALKPHOS 74  BILITOT 0.8  PROT 6.8  ALBUMIN 4.0   No results for input(s): LIPASE, AMYLASE in the last 168 hours. No results for input(s): AMMONIA in the last 168 hours.  CBC:  Recent Labs Lab 04/18/16 1323 04/18/16 1329  WBC 6.1  --   NEUTROABS 3.1  --   HGB 12.2 12.9  HCT 37.9 38.0  MCV 92.9  --   PLT 199  --     Cardiac Enzymes: No results for input(s): CKTOTAL, CKMB, CKMBINDEX, TROPONINI in the last 168 hours.  BNP: Invalid input(s): POCBNP  CBG: No results for input(s): GLUCAP in the last 168 hours.  Microbiology: Results for orders placed or performed during the hospital encounter of 08/16/15  Urine culture     Status: Abnormal   Collection Time: 08/16/15  3:17 PM  Result Value Ref Range Status   Specimen Description URINE, RANDOM  Final   Special Requests NONE  Final   Culture MULTIPLE SPECIES PRESENT, SUGGEST RECOLLECTION (A)  Final   Report Status 08/17/2015 FINAL  Final    Coagulation Studies:  Recent Labs  04/18/16 1323  LABPROT 13.1  INR 0.99    Urinalysis: No results for input(s): COLORURINE, LABSPEC, PHURINE, GLUCOSEU, HGBUR, BILIRUBINUR, KETONESUR, PROTEINUR, UROBILINOGEN, NITRITE, LEUKOCYTESUR in the last 168 hours.  Invalid input(s): APPERANCEUR  Lipid Panel:    Component Value Date/Time   CHOL 212 (H) 02/28/2016 0205   TRIG 286 (H) 02/28/2016 0205   HDL 43 02/28/2016 0205   CHOLHDL 4.9 02/28/2016 0205   VLDL 57 (H) 02/28/2016 0205   LDLCALC 112 (H) 02/28/2016 0205    HgbA1C:  Lab Results  Component Value Date   HGBA1C 7.0 (H) 02/19/2016    Urine Drug Screen:  No results found for: LABOPIA, COCAINSCRNUR, LABBENZ, AMPHETMU, THCU, LABBARB  Alcohol Level: No results for input(s): ETH in the last 168 hours.  Other results: EKG: Sinus  bradycardia rate 52 bpm. Please see formal cardiology reading for complete details.  Imaging:  Ct Head Code Stroke W/o Cm 04/18/2016 1. No acute finding. Chronic small-vessel ischemic changes as seen previously.2. ASPECTS is 10       Assessment: 71 y.o. female history of previous strokes, seizures, coronary artery disease, Mi,  gastroesophageal reflux disease, asthma / COPD, ongoing tobacco use, congestive heart failure, diabetes mellitus, hyperlipidemia, hypertension, recently diagnosed atrial fibrillation (on Eliquis), depression, and thyroid disease presenting with a headache, right eye pain, drooping of the right eye, poor balance, and mild right-sided weakness.   Plan: Discharge to home on current medications.   Mikey Bussing PA-C [facility] Pager [phone] 04/18/2016, 2:54 PM   Neurology Attending Addendum  This patient was seen, examined, and d/w PA. I have reviewed the note and agree with the findings, assessment and plan as documented with the following additions.   In brief, this is a 71 year old right-handed woman who presents as a code stroke after developing right-sided weakness earlier today. History is obtained directly from the patient was a good historian. He states that she was in her usual state of health when she suddenly developed a severe right-sided headache with right eye pain this is followed by a sensation of right-sided weakness. She states that whenever she has a spare headache, she tends to get weakness. She has a prior history of stroke and intracerebral hemorrhage. She has a history of atrial fibrillation and is on Eliquis which she reports was just started one week ago. She was taken for emergent CT scan of the head which showed no acute abnormality. On examination, no focal deficits were appreciated. NIH stroke scale score was 0. Due to the fact this was not felt to be strongly consistent with stroke, code stroke was canceled. She is not a  candidate for lytic therapy because she is on anticoagulation and she has a history of intracerebral hemorrhage.  Exam: General: This is a well-developed well-nourished woman lying in the ED gurney. She complains of headache. She is alert and fully oriented. Speech is clear without dysarthria. No aphasia. HEENT: Her neck is supple without lymphadenopathy. Mucous membranes are moist and the oropharynx is clear. Sclerae are anicteric. She has mild conjunctival injection. Cardiovascular: Her heart is regular without murmur. No carotid  bruits. Carotid pulses and distal pulses 2+ and symmetric. Lungs: Clear to auscultation bilaterally. Abdomen: Soft, nondistended, nontender. Bowel sounds present and normal. Extremities: No cyanosis, clubbing, edema. Neurologic: Cranial nerves: 2 through 12 are notable for some asymmetry of the left mouth which appears to be chronic based upon the nerves. This is somewhat inconsistent, however. Remainder of cranial nerves are unremarkable. Motor: She demonstrate normal bulk, tone, strength throughout. No tremor or other abnormal movements are seen. She has no drift. Sensation: This is intact to light touch. Coordination: Finger to nose is slower on the right than the left but no dysmetria. Heel-to-shin is also much more deliberate on the right than the left but normal. She appears to be putting very exaggerated effort into right-sided movements. DTRs: 2+, symmetric. Toes downgoing bilaterally.  Imaging: I personally and independently reviewed CT scan of the head without contrast from today. This shows no acute abnormality. She has a moderate degree of chronic small vessel ischemic change involving the bihemispheric white matter.  Pertinent labs: Coags normal CBC normal CMP notable for potassium 3.3, CO2 21 Troponin 0.00  Impression: 1. Complicated migraine 2. Left facial droop, chronic 3. Right hemiparesis, subjective  Recommendations:  As per PA note. No  evidence of acute cerebral ischemia on examination. I suspect her symptoms are largely related to her headache at this time. Recommend symptomatically headache management, no further workup at this time. She can be discharged home if headache and be satisfactorily treated.  No additional recommendations. Neurology will sign off. Please call if any new issues arise.  This was discussed with the ED attending, Dr. Tomi Bamberger.

## 2016-04-18 NOTE — ED Triage Notes (Signed)
Pt from home with right side facial droop and right eye pain.

## 2016-04-25 DIAGNOSIS — M1712 Unilateral primary osteoarthritis, left knee: Secondary | ICD-10-CM | POA: Diagnosis not present

## 2016-04-25 DIAGNOSIS — M7542 Impingement syndrome of left shoulder: Secondary | ICD-10-CM | POA: Diagnosis not present

## 2016-04-25 DIAGNOSIS — M25512 Pain in left shoulder: Secondary | ICD-10-CM | POA: Diagnosis not present

## 2016-04-25 DIAGNOSIS — M25562 Pain in left knee: Secondary | ICD-10-CM | POA: Diagnosis not present

## 2016-04-29 DIAGNOSIS — I4891 Unspecified atrial fibrillation: Secondary | ICD-10-CM | POA: Diagnosis not present

## 2016-05-06 ENCOUNTER — Ambulatory Visit: Payer: Medicare Other | Admitting: Family Medicine

## 2016-05-13 ENCOUNTER — Other Ambulatory Visit: Payer: Self-pay | Admitting: Family Medicine

## 2016-05-13 DIAGNOSIS — M159 Polyosteoarthritis, unspecified: Secondary | ICD-10-CM

## 2016-05-13 DIAGNOSIS — M150 Primary generalized (osteo)arthritis: Secondary | ICD-10-CM

## 2016-05-13 DIAGNOSIS — M25562 Pain in left knee: Secondary | ICD-10-CM

## 2016-05-13 NOTE — Telephone Encounter (Signed)
Last ov  03/05/16 Last filled 11/13/15

## 2016-08-07 ENCOUNTER — Other Ambulatory Visit: Payer: Self-pay | Admitting: Family Medicine

## 2016-08-07 DIAGNOSIS — E038 Other specified hypothyroidism: Secondary | ICD-10-CM

## 2016-08-07 DIAGNOSIS — E1140 Type 2 diabetes mellitus with diabetic neuropathy, unspecified: Secondary | ICD-10-CM

## 2016-08-07 MED ORDER — LEVOTHYROXINE SODIUM 175 MCG PO TABS: 175.0000 ug | ORAL_TABLET | Freq: Every day | ORAL | 5 refills | Status: DC

## 2016-08-07 NOTE — Telephone Encounter (Signed)
Previously dose Levothyroxine changed from 120mcg daily to 110mcg, patient was prescribed this but may have not filled yet. DC 125 request and switch back to [place], DO [facility] 08/07/2016, 8:10 AM

## 2016-08-20 ENCOUNTER — Telehealth: Payer: Self-pay | Admitting: Family Medicine

## 2016-08-20 ENCOUNTER — Other Ambulatory Visit: Payer: Self-pay | Admitting: Family Medicine

## 2016-08-20 NOTE — Telephone Encounter (Signed)
Pt. Called states that she is taken 1/2 of  Furosemide states that her ankle are swollen. Wanted to know if   She can  Take a whole pill (furosemide) for a couple of day to see if the swelling  go down.  Pt. Call back  # is  [phone]

## 2016-08-20 NOTE — Telephone Encounter (Signed)
Attempted to call patient back, did not reach her, voicemail did not identify, so did not leave message.  Yes. She may increase Furosemide to 1 WHOLE pill = 20mg  once daily for up to 3-5 days to help swelling. Then if improved reduce back to HALF pill daily.  Also recommend to continue Compression and Elevation (foot above heart level, prop up with pillows) to help reduce swelling. If swelling still not improving after 1 week, she may contact her Cardiologist Dr Clayborn Bigness who discussed this with her as well most recently, or follow-up with [place] or them.  If one ankle or whole leg is significantly swollen compared to other, and warm, red, painful, should seek more immediate treatment at office, urgent care or ED.  Samantha Aguirre, [facility] 08/20/2016, 11:49 AM

## 2016-08-21 NOTE — Telephone Encounter (Signed)
Please see previous note from Dr. Parks Ranger. Pt called asking if she can increase her Lasix, because she is having some lower extremity swelling,and now today some SOB. I informed the pt that Dr.Karamalegos tried to f/u with her on yesterday concerning this, but he unable to reach her. I gave the pt his advice. I also informed Dr. Raliegh Ip that she is now experiencing some SOB. Per Dr. Raliegh Ip advice, I informed her that she can increase her Lasix to 20 MG bid for 3-5 days to see if this helps her symptoms. I also informed her that she needs to contact her cardiologist and let them know of her new onset symptoms and see if they agree with the plan. I stress to the pt the importants of f/u with her cardiologist. The pt was notified if the symptoms doesn't improve or gets worse she needs to go to the ER. She verbalize understanding.

## 2016-09-19 ENCOUNTER — Telehealth: Payer: Self-pay | Admitting: Family Medicine

## 2016-09-19 NOTE — Telephone Encounter (Signed)
Called earlier today 09/19/16 AM regarding similar concern from last time 08/2016 with inc leg swelling bilateral and some dyspnea, she was taking Lasix 20mg  (half tab) only PRN, then previously advised to inc to whole pill 20mg  daily for short period.  Today she was advised that she may take whole pill lasix 20mg  and can repeat dose 5-6 hours later today for BID dosing for 3-5 days if needed. If helping reduce swelling and improve breathing, otherwise, if not improving after first dose today may consider going to Hospital ED for evaluation, needs to follow-up closely if not improving.  Nobie Putnam, [facility] 09/19/2016, 5:13 PM

## 2016-09-24 ENCOUNTER — Other Ambulatory Visit: Payer: Self-pay

## 2016-09-24 DIAGNOSIS — M545 Low back pain: Principal | ICD-10-CM

## 2016-09-24 DIAGNOSIS — M159 Polyosteoarthritis, unspecified: Secondary | ICD-10-CM

## 2016-09-24 DIAGNOSIS — G8929 Other chronic pain: Secondary | ICD-10-CM

## 2016-09-24 DIAGNOSIS — E038 Other specified hypothyroidism: Secondary | ICD-10-CM

## 2016-09-24 DIAGNOSIS — E1140 Type 2 diabetes mellitus with diabetic neuropathy, unspecified: Secondary | ICD-10-CM

## 2016-09-24 DIAGNOSIS — M150 Primary generalized (osteo)arthritis: Secondary | ICD-10-CM

## 2016-09-24 DIAGNOSIS — E782 Mixed hyperlipidemia: Secondary | ICD-10-CM

## 2016-09-24 DIAGNOSIS — M25562 Pain in left knee: Secondary | ICD-10-CM

## 2016-09-24 MED ORDER — POTASSIUM CHLORIDE ER 10 MEQ PO CPCR: 10.0000 meq | ORAL_CAPSULE | Freq: Every day | ORAL | 3 refills | Status: DC

## 2016-09-24 MED ORDER — LEVOTHYROXINE SODIUM 175 MCG PO TABS: 175.0000 ug | ORAL_TABLET | Freq: Every day | ORAL | 3 refills | Status: DC

## 2016-09-24 MED ORDER — DULOXETINE HCL 60 MG PO CPEP: 60.0000 mg | ORAL_CAPSULE | Freq: Every day | ORAL | 11 refills | Status: DC

## 2016-09-24 MED ORDER — ATORVASTATIN CALCIUM 80 MG PO TABS: 80.0000 mg | ORAL_TABLET | Freq: Every day | ORAL | 3 refills | Status: DC

## 2016-09-24 MED ORDER — BACLOFEN 10 MG PO TABS: 5.0000 mg | ORAL_TABLET | Freq: Three times a day (TID) | ORAL | 1 refills | Status: DC | PRN

## 2016-09-24 MED ORDER — GABAPENTIN 800 MG PO TABS: 800.0000 mg | ORAL_TABLET | Freq: Two times a day (BID) | ORAL | 1 refills | Status: DC

## 2016-10-02 ENCOUNTER — Other Ambulatory Visit: Payer: Self-pay | Admitting: Family Medicine

## 2016-10-02 DIAGNOSIS — E1140 Type 2 diabetes mellitus with diabetic neuropathy, unspecified: Secondary | ICD-10-CM

## 2016-10-02 DIAGNOSIS — I10 Essential (primary) hypertension: Secondary | ICD-10-CM

## 2016-10-02 MED ORDER — ALBUTEROL SULFATE HFA 108 (90 BASE) MCG/ACT IN AERS: 2.0000 | INHALATION_SPRAY | Freq: Four times a day (QID) | RESPIRATORY_TRACT | 2 refills | Status: DC | PRN

## 2016-10-02 MED ORDER — LISINOPRIL 20 MG PO TABS: 20.0000 mg | ORAL_TABLET | Freq: Every day | ORAL | 11 refills | Status: DC

## 2016-10-02 MED ORDER — FLUTICASONE PROPIONATE 50 MCG/ACT NA SUSP: 1.0000 | Freq: Every day | NASAL | 2 refills | Status: DC

## 2016-10-03 ENCOUNTER — Telehealth: Payer: Self-pay

## 2016-10-03 ENCOUNTER — Observation Stay
Admission: EM | Admit: 2016-10-03 | Discharge: 2016-10-04 | Disposition: A | Discharge status: Home / Self Care | Payer: Medicare Other | Attending: Internal Medicine | Admitting: Internal Medicine

## 2016-10-03 ENCOUNTER — Emergency Department: Payer: Medicare Other

## 2016-10-03 ENCOUNTER — Encounter: Payer: Self-pay | Admitting: Emergency Medicine

## 2016-10-03 DIAGNOSIS — Z7901 Long term (current) use of anticoagulants: Secondary | ICD-10-CM | POA: Insufficient documentation

## 2016-10-03 DIAGNOSIS — I482 Chronic atrial fibrillation: Secondary | ICD-10-CM | POA: Diagnosis not present

## 2016-10-03 DIAGNOSIS — I2510 Atherosclerotic heart disease of native coronary artery without angina pectoris: Secondary | ICD-10-CM | POA: Insufficient documentation

## 2016-10-03 DIAGNOSIS — Z79899 Other long term (current) drug therapy: Secondary | ICD-10-CM | POA: Insufficient documentation

## 2016-10-03 DIAGNOSIS — E039 Hypothyroidism, unspecified: Secondary | ICD-10-CM | POA: Diagnosis not present

## 2016-10-03 DIAGNOSIS — Z85828 Personal history of other malignant neoplasm of skin: Secondary | ICD-10-CM | POA: Insufficient documentation

## 2016-10-03 DIAGNOSIS — J449 Chronic obstructive pulmonary disease, unspecified: Secondary | ICD-10-CM | POA: Diagnosis not present

## 2016-10-03 DIAGNOSIS — F329 Major depressive disorder, single episode, unspecified: Secondary | ICD-10-CM | POA: Diagnosis not present

## 2016-10-03 DIAGNOSIS — Z720 Tobacco use: Secondary | ICD-10-CM | POA: Diagnosis not present

## 2016-10-03 DIAGNOSIS — I69392 Facial weakness following cerebral infarction: Secondary | ICD-10-CM | POA: Diagnosis not present

## 2016-10-03 DIAGNOSIS — Z23 Encounter for immunization: Secondary | ICD-10-CM | POA: Diagnosis not present

## 2016-10-03 DIAGNOSIS — I5032 Chronic diastolic (congestive) heart failure: Secondary | ICD-10-CM | POA: Insufficient documentation

## 2016-10-03 DIAGNOSIS — E1140 Type 2 diabetes mellitus with diabetic neuropathy, unspecified: Secondary | ICD-10-CM | POA: Diagnosis not present

## 2016-10-03 DIAGNOSIS — Z7984 Long term (current) use of oral hypoglycemic drugs: Secondary | ICD-10-CM | POA: Insufficient documentation

## 2016-10-03 DIAGNOSIS — I639 Cerebral infarction, unspecified: Secondary | ICD-10-CM | POA: Diagnosis not present

## 2016-10-03 DIAGNOSIS — R4781 Slurred speech: Secondary | ICD-10-CM | POA: Diagnosis not present

## 2016-10-03 DIAGNOSIS — E785 Hyperlipidemia, unspecified: Secondary | ICD-10-CM | POA: Insufficient documentation

## 2016-10-03 DIAGNOSIS — R51 Headache: Secondary | ICD-10-CM | POA: Diagnosis not present

## 2016-10-03 DIAGNOSIS — I110 Hypertensive heart disease with heart failure: Secondary | ICD-10-CM | POA: Diagnosis not present

## 2016-10-03 DIAGNOSIS — I252 Old myocardial infarction: Secondary | ICD-10-CM | POA: Insufficient documentation

## 2016-10-03 DIAGNOSIS — I10 Essential (primary) hypertension: Secondary | ICD-10-CM | POA: Diagnosis not present

## 2016-10-03 DIAGNOSIS — F17210 Nicotine dependence, cigarettes, uncomplicated: Secondary | ICD-10-CM | POA: Diagnosis not present

## 2016-10-03 DIAGNOSIS — E7800 Pure hypercholesterolemia, unspecified: Secondary | ICD-10-CM | POA: Diagnosis not present

## 2016-10-03 DIAGNOSIS — G40909 Epilepsy, unspecified, not intractable, without status epilepticus: Secondary | ICD-10-CM | POA: Insufficient documentation

## 2016-10-03 DIAGNOSIS — M810 Age-related osteoporosis without current pathological fracture: Secondary | ICD-10-CM | POA: Insufficient documentation

## 2016-10-03 DIAGNOSIS — D320 Benign neoplasm of cerebral meninges: Secondary | ICD-10-CM | POA: Insufficient documentation

## 2016-10-03 DIAGNOSIS — R531 Weakness: Secondary | ICD-10-CM | POA: Diagnosis not present

## 2016-10-03 DIAGNOSIS — M79602 Pain in left arm: Secondary | ICD-10-CM | POA: Diagnosis not present

## 2016-10-03 DIAGNOSIS — F17200 Nicotine dependence, unspecified, uncomplicated: Secondary | ICD-10-CM | POA: Diagnosis not present

## 2016-10-03 HISTORY — DX: Unspecified atrial fibrillation: I48.91

## 2016-10-03 LAB — TROPONIN I

## 2016-10-03 LAB — URINALYSIS, COMPLETE (UACMP) WITH MICROSCOPIC
BACTERIA UA: NONE SEEN
Bilirubin Urine: NEGATIVE
Glucose, UA: NEGATIVE mg/dL
Hgb urine dipstick: NEGATIVE
Ketones, ur: NEGATIVE mg/dL
Leukocytes, UA: NEGATIVE
NITRITE: NEGATIVE
PROTEIN: NEGATIVE mg/dL
SPECIFIC GRAVITY, URINE: 1.02 (ref 1.005–1.030)
WBC UA: NONE SEEN WBC/hpf (ref 0–5)
pH: 6 (ref 5.0–8.0)

## 2016-10-03 LAB — BASIC METABOLIC PANEL
Anion gap: 10 (ref 5–15)
BUN: 12 mg/dL (ref 6–20)
CO2: 25 mmol/L (ref 22–32)
Calcium: 9 mg/dL (ref 8.9–10.3)
Chloride: 107 mmol/L (ref 101–111)
Creatinine, Ser: 0.69 mg/dL (ref 0.44–1.00)
GFR calc Af Amer: >60 mL/min (ref >60)
GLUCOSE: 141 mg/dL — AB (ref 65–99)
POTASSIUM: 3.8 mmol/L (ref 3.5–5.1)
Sodium: 142 mmol/L (ref 135–145)

## 2016-10-03 LAB — CBC
HEMATOCRIT: 35 % (ref 35.0–47.0)
Hemoglobin: 12 g/dL (ref 12.0–16.0)
MCH: 29.8 pg (ref 26.0–34.0)
MCHC: 34.3 g/dL (ref 32.0–36.0)
MCV: 86.9 fL (ref 80.0–100.0)
Platelets: 287 10*3/uL (ref 150–440)
RBC: 4.03 MIL/uL (ref 3.80–5.20)
RDW: 15.2 % — AB (ref 11.5–14.5)
WBC: 7.3 10*3/uL (ref 3.6–11.0)

## 2016-10-03 LAB — BRAIN NATRIURETIC PEPTIDE: B NATRIURETIC PEPTIDE 5: 36 pg/mL (ref 0.0–100.0)

## 2016-10-03 LAB — PROTIME-INR
INR: 1.05
Prothrombin Time: 13.6 seconds (ref 11.4–15.2)

## 2016-10-03 MED ORDER — LEVETIRACETAM 500 MG PO TABS
500.0000 mg | ORAL_TABLET | Freq: Two times a day (BID) | ORAL | Status: DC
  Administered 2016-10-03 – 2016-10-04 (×2): 500 mg via ORAL
  Filled 2016-10-03 (×3): qty 1

## 2016-10-03 MED ORDER — LORAZEPAM 2 MG/ML IJ SOLN
1.0000 mg | INTRAMUSCULAR | Status: AC
  Administered 2016-10-04: 1 mg via INTRAVENOUS
  Filled 2016-10-03: qty 1

## 2016-10-03 MED ORDER — SENNOSIDES-DOCUSATE SODIUM 8.6-50 MG PO TABS: 1.0000 | ORAL_TABLET | Freq: Every evening | ORAL | Status: DC | PRN

## 2016-10-03 MED ORDER — NITROGLYCERIN 0.4 MG SL SUBL: 0.4000 mg | SUBLINGUAL_TABLET | SUBLINGUAL | Status: DC | PRN

## 2016-10-03 MED ORDER — INFLUENZA VAC SPLIT HIGH-DOSE 0.5 ML IM SUSY
0.5000 mL | PREFILLED_SYRINGE | INTRAMUSCULAR | Status: AC
  Administered 2016-10-04: 0.5 mL via INTRAMUSCULAR
  Filled 2016-10-03: qty 0.5

## 2016-10-03 MED ORDER — FLUTICASONE PROPIONATE 50 MCG/ACT NA SUSP
1.0000 | Freq: Every day | NASAL | Status: DC
  Administered 2016-10-04: 09:00:00 1 via NASAL
  Filled 2016-10-03: qty 16

## 2016-10-03 MED ORDER — STROKE: EARLY STAGES OF RECOVERY BOOK
Freq: Once | Status: AC
  Administered 2016-10-03: 21:00:00

## 2016-10-03 MED ORDER — SODIUM CHLORIDE 0.9 % IV SOLN
INTRAVENOUS | Status: DC
  Administered 2016-10-03: 21:00:00 via INTRAVENOUS

## 2016-10-03 MED ORDER — DULOXETINE HCL 30 MG PO CPEP
60.0000 mg | ORAL_CAPSULE | Freq: Every day | ORAL | Status: DC
  Administered 2016-10-03: 60 mg via ORAL
  Filled 2016-10-03: qty 2

## 2016-10-03 MED ORDER — ENOXAPARIN SODIUM 40 MG/0.4ML ~~LOC~~ SOLN: 40.0000 mg | SUBCUTANEOUS | Status: DC

## 2016-10-03 MED ORDER — FUROSEMIDE 20 MG PO TABS
10.0000 mg | ORAL_TABLET | Freq: Every day | ORAL | Status: DC
  Administered 2016-10-04: 10 mg via ORAL
  Filled 2016-10-03: qty 1

## 2016-10-03 MED ORDER — LEVOTHYROXINE SODIUM 100 MCG PO TABS
175.0000 ug | ORAL_TABLET | Freq: Every day | ORAL | Status: DC
  Administered 2016-10-04: 09:00:00 175 ug via ORAL
  Filled 2016-10-03: qty 1

## 2016-10-03 MED ORDER — APIXABAN 5 MG PO TABS
5.0000 mg | ORAL_TABLET | Freq: Two times a day (BID) | ORAL | Status: DC
  Administered 2016-10-03 – 2016-10-04 (×2): 5 mg via ORAL
  Filled 2016-10-03 (×2): qty 1

## 2016-10-03 MED ORDER — POTASSIUM CHLORIDE CRYS ER 10 MEQ PO TBCR
10.0000 meq | EXTENDED_RELEASE_TABLET | Freq: Every day | ORAL | Status: DC
  Administered 2016-10-04: 10 meq via ORAL
  Filled 2016-10-03: qty 1

## 2016-10-03 MED ORDER — ATORVASTATIN CALCIUM 20 MG PO TABS: 80.0000 mg | ORAL_TABLET | Freq: Every day | ORAL | Status: DC

## 2016-10-03 MED ORDER — UMECLIDINIUM BROMIDE 62.5 MCG/INH IN AEPB
1.0000 | INHALATION_SPRAY | Freq: Every day | RESPIRATORY_TRACT | Status: DC
  Administered 2016-10-04: 1 via RESPIRATORY_TRACT
  Filled 2016-10-03: qty 7

## 2016-10-03 MED ORDER — GABAPENTIN 400 MG PO CAPS
800.0000 mg | ORAL_CAPSULE | Freq: Two times a day (BID) | ORAL | Status: DC
  Administered 2016-10-03 – 2016-10-04 (×2): 800 mg via ORAL
  Filled 2016-10-03 (×2): qty 2

## 2016-10-03 NOTE — ED Notes (Signed)
Pt given ginger ale after passing stroke swallow screen.

## 2016-10-03 NOTE — Telephone Encounter (Signed)
Pt called complaining of numbness and tingling in her face with a blood  Pressure of 209/118. She said she doesn't feel well. After reviewing the chart I notice the pt also have history of cardiac issues. I advise the pt to contact EMS right away because I'm concern she could be having a stroke. The pt informed me that her next door neighbor was with her and she would contact EMS right after she disconnect the phone call with me.

## 2016-10-03 NOTE — ED Notes (Signed)
Dr. Chen at bedside.

## 2016-10-03 NOTE — H&P (Addendum)
[facility] NAME: Samantha Aguirre    MR#:  [id number]  DATE OF BIRTH:  06/05/1945  DATE OF ADMISSION:  10/03/2016  PRIMARY CARE PHYSICIAN: Olin Hauser, DO   REQUESTING/REFERRING PHYSICIAN: Schuyler Amor, MD  CHIEF COMPLAINT:   Chief Complaint  Patient presents with  . Weakness   Left-sided weakness and slurred speech today. HISTORY OF PRESENT ILLNESS:  Samantha Aguirre  is a 71 y.o. female with a known history of Hypertension, A. fib, diastolic CHF, COPD, CAD, CVA, hyperlipidemia and seizure disorder. The patient to present to the ED with the above chief complaints. She has a history of recurrent CVA. She started to have slight headache last night. But he feels left-sided weakness since this morning. She also complains of slurred speech this morning, which resolved later. Her CAT scan of the head didn't show any acute CVA. She has history of A. fib, on Eliquis. He denies any chest pain, dysphagia or incontinence.  PAST MEDICAL HISTORY:   Past Medical History:  Diagnosis Date  . Acid reflux   . Arthritis    Bilateral knees, hands, feet  . Asthma   . Atrial fibrillation ([place])   . Cancer ([place])   . Chest pain   . CHF (congestive heart failure) (HCC)    Diastolic CHF  . COPD (chronic obstructive pulmonary disease) ([place])   . Coronary artery disease   . Diabetes mellitus without complication ([place])   . Frequent headaches   . Heart attack ([place])   . High cholesterol   . HLD (hyperlipidemia)   . Hypertension   . MI (myocardial infarction) ([place])   . Seizures ([place])   . Skin cancer 2015   Suspected basal cell carcinoma on nose, surgically removed  . Stroke ([place])   . Thyroid disease   . Tremors of nervous system     PAST SURGICAL HISTORY:   Past Surgical History:  Procedure Laterality Date  . ABDOMINAL HYSTERECTOMY  1976  . APPENDECTOMY  1980  . CAROTID ENDARTERECTOMY    . CHOLECYSTECTOMY  1980  .  [place]  . KNEE SURGERY     left  . ROTATOR CUFF REPAIR     right  . TONSILLECTOMY  1950    SOCIAL HISTORY:   Social History  Substance Use Topics  . Smoking status: Current Every Day Smoker    Packs/day: 0.25    Years: 50.00    Types: Cigarettes  . Smokeless tobacco: Current User  . Alcohol use No    FAMILY HISTORY:   Family History  Problem Relation Age of Onset  . Breast cancer Mother   . Heart disease Mother   . Stroke Mother   . Cancer Mother   . COPD Mother   . Heart disease Father   . Diabetes Father   . Stroke Father   . Alcohol abuse Sister   . Drug abuse Sister   . Stroke Sister   . Cancer Sister   . Mental illness Sister   . Heart disease Brother   . Arthritis Brother   . Diabetes Brother   . Heart disease Maternal Grandfather   . Heart disease Paternal Grandfather     DRUG ALLERGIES:   Allergies  Allergen Reactions  . Mushroom Extract Complex Anaphylaxis    Made patient "deathly sick"  . Atenolol     "MD took me off of it bc it was doing something wrong" Made  patient HYPOTENSIVE  . Ivp Dye [Iodinated Diagnostic Agents] Itching    Pt injected with IV contrast.  5 min after injection pt complained of itching behind ear and on her abd.  . Morphine And Related Other (See Comments)    "stops my breathing" NEAR-RESPIRATORY ARREST  . Percocet [Oxycodone-Acetaminophen] Nausea And Vomiting and Other (See Comments)    Stomach pains  . Percodan [Oxycodone-Aspirin] Nausea And Vomiting and Other (See Comments)    Stomach pains  . Verapamil Other (See Comments)    " MD told me this was screwing me up" MAKES PATIENT HYPOTENSIVE    REVIEW OF SYSTEMS:   Review of Systems  Constitutional: Negative for chills, fever and malaise/fatigue.  HENT: Negative for sore throat.   Eyes: Negative for blurred vision and double vision.  Respiratory: Negative for cough, hemoptysis, shortness of breath, wheezing and stridor.   Cardiovascular:  Negative for chest pain, palpitations, orthopnea and leg swelling.  Gastrointestinal: Negative for abdominal pain, blood in stool, diarrhea, melena, nausea and vomiting.  Genitourinary: Negative for dysuria, flank pain and hematuria.  Musculoskeletal: Negative for back pain and joint pain.  Neurological: Positive for speech change, focal weakness and weakness. Negative for dizziness, sensory change, seizures, loss of consciousness and headaches.  Endo/Heme/Allergies: Negative for polydipsia.  Psychiatric/Behavioral: Negative for depression. The patient is not nervous/anxious.     MEDICATIONS AT HOME:   Prior to Admission medications   Medication Sig Start Date End Date Taking? Authorizing Provider  acetaminophen (TYLENOL) 325 MG tablet Take 650 mg by mouth 2 (two) times daily.    Yes [provider]  albuterol (PROVENTIL HFA;VENTOLIN HFA) 108 (90 Base) MCG/ACT inhaler Inhale 2 puffs into the lungs every 6 (six) hours as needed for wheezing or shortness of breath. 10/02/16  Yes Karamalegos, Devonne Doughty, DO  apixaban (ELIQUIS) 5 MG TABS tablet Take 5 mg by mouth every 12 (twelve) hours.   Yes [provider]  atorvastatin (LIPITOR) 80 MG tablet Take 1 tablet (80 mg total) by mouth daily at 6 PM. 09/24/16  Yes Karamalegos, Devonne Doughty, DO  baclofen (LIORESAL) 10 MG tablet Take 0.5-1 tablets (5-10 mg total) by mouth 3 (three) times daily as needed for muscle spasms. 09/24/16  Yes Karamalegos, Devonne Doughty, DO  diclofenac sodium (VOLTAREN) 1 % GEL Apply 2 g topically 4 (four) times daily. 01/11/16  Yes Daymon Larsen, MD  DULoxetine (CYMBALTA) 60 MG capsule Take 1 capsule (60 mg total) by mouth at bedtime. 09/24/16  Yes Karamalegos, Devonne Doughty, DO  fluticasone (FLONASE) 50 MCG/ACT nasal spray Place 1 spray into both nostrils daily. 10/02/16  Yes Karamalegos, Alexander J, DO  fluticasone furoate-vilanterol (BREO ELLIPTA) 100-25 MCG/INH AEPB Inhale 1 puff into the lungs daily. Patient  taking differently: Inhale 1 puff into the lungs daily as needed. For shortness of breath 11/13/15  Yes Karamalegos, Devonne Doughty, DO  furosemide (LASIX) 20 MG tablet Take 10 mg by mouth daily.    Yes Karamalegos, Devonne Doughty, DO  gabapentin (NEURONTIN) 800 MG tablet Take 1 tablet (800 mg total) by mouth 2 (two) times daily. 09/24/16  Yes Karamalegos, Devonne Doughty, DO  glipiZIDE-metformin (METAGLIP) 5-500 MG tablet Take 1-2 tablets by mouth 2 (two) times daily. 2 tablets every morning and 1 tablet at bedtime   Yes [provider]  levETIRAcetam (KEPPRA) 500 MG tablet Take 1 tablet (500 mg total) by mouth 2 (two) times daily. 09/19/15  Yes Epifanio Lesches, MD  levothyroxine (SYNTHROID, LEVOTHROID) 175 MCG tablet Take  1 tablet (175 mcg total) by mouth daily before breakfast. 09/24/16  Yes Karamalegos, Devonne Doughty, DO  lisinopril (PRINIVIL,ZESTRIL) 20 MG tablet Take 1 tablet (20 mg total) by mouth daily. 10/02/16  Yes Karamalegos, Devonne Doughty, DO  nitroGLYCERIN (NITROSTAT) 0.4 MG SL tablet Place 1 tablet (0.4 mg total) under the tongue every 5 (five) minutes as needed for chest pain. 02/28/16  Yes Mody, Ulice Bold, MD  potassium chloride (MICRO-K) 10 MEQ CR capsule Take 1 capsule (10 mEq total) by mouth daily. 09/24/16  Yes Karamalegos, Devonne Doughty, DO  umeclidinium bromide (INCRUSE ELLIPTA) 62.5 MCG/INH AEPB Inhale 1 puff into the lungs daily. 11/13/15  Yes Karamalegos, Devonne Doughty, DO      VITAL SIGNS:  Blood pressure (!) 130/91, pulse (!) 59, temperature 97.9 F (36.6 C), temperature source Oral, resp. rate 15, height 5\' 1"  (1.549 m), weight 169 lb (76.7 kg), SpO2 99 %.  PHYSICAL EXAMINATION:  Physical Exam  GENERAL:  71 y.o.-year-old patient lying in the bed with no acute distress.  EYES: Pupils equal, round, reactive to light and accommodation. No scleral icterus. Extraocular muscles intact.  HEENT: Head atraumatic, normocephalic. Oropharynx and nasopharynx clear.  NECK:  Supple, no jugular  venous distention. No thyroid enlargement, no tenderness.  LUNGS: Normal breath sounds bilaterally, no wheezing, rales,rhonchi or crepitation. No use of accessory muscles of respiration.  CARDIOVASCULAR: S1, S2 normal. No murmurs, rubs, or gallops.  ABDOMEN: Soft, nontender, nondistended. Bowel sounds present. No organomegaly or mass.  EXTREMITIES: No pedal edema, cyanosis, or clubbing.  NEUROLOGIC: Cranial nerves II through XII are intact. Muscle strength 3-4/5 in left extremities. Sensation intact. Gait not checked.  PSYCHIATRIC: The patient is alert and oriented x 3.  SKIN: No obvious rash, lesion, or ulcer.   LABORATORY PANEL:   CBC  Recent Labs Lab 10/03/16 1436  WBC 7.3  HGB 12.0  HCT 35.0  PLT 287   ------------------------------------------------------------------------------------------------------------------  Chemistries   Recent Labs Lab 10/03/16 1436  NA 142  K 3.8  CL 107  CO2 25  GLUCOSE 141*  BUN 12  CREATININE 0.69  CALCIUM 9.0   ------------------------------------------------------------------------------------------------------------------  Cardiac Enzymes  Recent Labs Lab 10/03/16 1436  TROPONINI <0.03   ------------------------------------------------------------------------------------------------------------------  RADIOLOGY:  Ct Head Wo Contrast  Result Date: 10/03/2016 CLINICAL DATA:  Left-sided weakness, left facial droop and headache for the past 30 minutes. EXAM: CT HEAD WITHOUT CONTRAST TECHNIQUE: Contiguous axial images were obtained from the base of the skull through the vertex without intravenous contrast. COMPARISON:  04/18/2016. FINDINGS: Brain: Diffusely enlarged ventricles and subarachnoid spaces. Patchy white matter low density in both cerebral hemispheres. Stable bilateral cerebellar hemisphere infarcts. No intracranial hemorrhage, mass lesion or CT evidence of acute infarction. Vascular: No hyperdense vessel or unexpected  calcification. Skull: Normal. Negative for fracture or focal lesion. Sinuses/Orbits: Unremarkable. Other: None. IMPRESSION: 1. No acute abnormality. 2. Stable atrophy, chronic small vessel white matter ischemic changes and old bilateral cerebellar hemisphere infarcts. Electronically Signed   By: Claudie Revering M.D.   On: 10/03/2016 15:02   Dg Chest Port 1 View  Result Date: 10/03/2016 CLINICAL DATA:  Left-sided weakness.  Smoker. EXAM: PORTABLE CHEST 1 VIEW COMPARISON:  02/27/2016. FINDINGS: Stable mildly enlarged cardiac silhouette. Clear lungs with normal vascularity. Stable small left upper lobe calcified granuloma. Thoracic spine degenerative changes. IMPRESSION: No acute abnormality.  Stable mild cardiomegaly. Electronically Signed   By: Claudie Revering M.D.   On: 10/03/2016 17:32      IMPRESSION AND PLAN:   Left-sided  weakness with slurred speech, need to rule out CVA. The patient will be placed on observation. Follow up MRI/MRA of the brain. Echocardiogram. Neuro check, PT OT evaluation.  Since the patient already has carotid duplex, which was unremarkable. I will not repeated. Continue Eliquis and lipitor.  Chronic A. Fib. EKG showed normal sinus rhythm at 70. Continue Eliquis. COPD. Stable. Hypertension. Hold lisinopril for permissive BP. Tobacco abuse. Smoking cessation was counseled for 4 minutes.   All the records are reviewed and case discussed with ED provider. Management plans discussed with the patient, family and they are in agreement.  CODE STATUS: Full code  TOTAL TIME TAKING CARE OF THIS PATIENT: 52 minutes.    Demetrios Loll M.D on 10/03/2016 at 5:44 PM  Between 7am to 6pm - Pager - [phone]  After 6pm go to [email or web address] - Proofreader  [facility]  Office  [phone]  CC: Primary care physician; Olin Hauser, DO   Note: This dictation was prepared with Dragon dictation along with smaller phrase technology. Any  transcriptional errors that result from this process are unin

## 2016-10-03 NOTE — Telephone Encounter (Signed)
Reviewed the below triage call with Donnie Mesa CMA. Patient has history of CVA last 02/2016. She is high risk. Patient advised to call EMS and seek immediate medical attention.  Nobie Putnam, [facility] 10/03/2016, 2:56 PM

## 2016-10-03 NOTE — ED Notes (Signed)
Pt transported to CT ?

## 2016-10-03 NOTE — ED Provider Notes (Signed)
[facility] Emergency Department Provider Note  ____________________________________________   I have reviewed the triage vital signs and the nursing notes.   HISTORY  Chief Complaint Weakness    HPI Samantha Aguirre is a 71 y.o. female who presents today complaining of left-sided weakness. Patient does have a history of recurrent CVAs, she states most recently was on the right. She has had a slight headache but the weakness actually started she believes sometime last night. She states that she's been feeling generally weak for a couple days and acutely weak on her left side which has been getting gradually worse since yesterday evening some time. Time of onset is very vague with the patient but she sure that she did not wake up normal this morning. She does have a slight headache which is not the worst headache [place] which was gradual in onset which is different from her migraines. She does not have any chest pain, she is chronically short of breath and continue smoking but she is not more short of breath than normal she states. Patient takes eliquis    Past Medical History:  Diagnosis Date  . Acid reflux   . Arthritis    Bilateral knees, hands, feet  . Asthma   . Atrial fibrillation ([place])   . Cancer ([place])   . Chest pain   . CHF (congestive heart failure) (HCC)    Diastolic CHF  . COPD (chronic obstructive pulmonary disease) ([place])   . Coronary artery disease   . Diabetes mellitus without complication ([place])   . Frequent headaches   . Heart attack ([place])   . High cholesterol   . HLD (hyperlipidemia)   . Hypertension   . MI (myocardial infarction) ([place])   . Seizures ([place])   . Skin cancer 2015   Suspected basal cell carcinoma on nose, surgically removed  . Stroke ([place])   . Thyroid disease   . Tremors of nervous system     Patient Active Problem List   Diagnosis Date Noted  . Chest pain 02/27/2016  . CVA (cerebral vascular accident) ([place]) 02/19/2016   . Closed fracture of face bones due to fall ([place]) 01/22/2016  . Thumb pain, left 01/22/2016  . Traumatic closed nondisplaced fracture of base of metacarpal bone of left thumb 01/22/2016  . Colon cancer screening 11/27/2015  . Depression 11/14/2015  . Coronary artery disease 11/13/2015  . Hypertension 11/13/2015  . History of skin cancer 11/13/2015  . Osteoporosis 11/13/2015  . Chronic low back pain 11/13/2015  . Left medial knee pain 11/13/2015  . Osteoarthritis of multiple joints 11/13/2015  . Cerebral infarction ([place]) 09/16/2015  . CVA, old, facial weakness   . Right sided weakness 08/16/2015  . Other specified hypothyroidism 08/16/2015  . Controlled type 2 diabetes mellitus with diabetic neuropathy ([place]) 08/16/2015  . COPD (chronic obstructive pulmonary disease) ([place]) 08/16/2015  . Tobacco abuse 08/16/2015  . HLD (hyperlipidemia) 08/16/2015  . History of CHF (congestive heart failure) 08/16/2015  . Seizures ([place])     Past Surgical History:  Procedure Laterality Date  . ABDOMINAL HYSTERECTOMY  1976  . APPENDECTOMY  1980  . CAROTID ENDARTERECTOMY    . CHOLECYSTECTOMY  1980  . [place]  . KNEE SURGERY     left  . ROTATOR CUFF REPAIR     right  . TONSILLECTOMY  1950    Prior to Admission medications   Medication Sig Start Date End Date Taking? Authorizing Provider  acetaminophen (TYLENOL) 325 MG  tablet Take 650 mg by mouth at bedtime.    [provider]  albuterol (PROVENTIL HFA;VENTOLIN HFA) 108 (90 Base) MCG/ACT inhaler Inhale 2 puffs into the lungs every 6 (six) hours as needed for wheezing or shortness of breath. 10/02/16   Karamalegos, Devonne Doughty, DO  atorvastatin (LIPITOR) 80 MG tablet Take 1 tablet (80 mg total) by mouth daily at 6 PM. 09/24/16   Karamalegos, Devonne Doughty, DO  baclofen (LIORESAL) 10 MG tablet Take 0.5-1 tablets (5-10 mg total) by mouth 3 (three) times daily as needed for muscle spasms. 09/24/16   Karamalegos, Devonne Doughty, DO   diclofenac sodium (VOLTAREN) 1 % GEL Apply 2 g topically 4 (four) times daily. 01/11/16   Daymon Larsen, MD  dipyridamole-aspirin (AGGRENOX) 200-25 MG 12hr capsule Take 1 capsule by mouth 2 (two) times daily. 09/19/15   Epifanio Lesches, MD  DULoxetine (CYMBALTA) 60 MG capsule Take 1 capsule (60 mg total) by mouth at bedtime. 09/24/16   Karamalegos, Devonne Doughty, DO  fluticasone (FLONASE) 50 MCG/ACT nasal spray Place 1 spray into both nostrils daily. 10/02/16   Karamalegos, Alexander J, DO  fluticasone furoate-vilanterol (BREO ELLIPTA) 100-25 MCG/INH AEPB Inhale 1 puff into the lungs daily. 11/13/15   Karamalegos, Devonne Doughty, DO  furosemide (LASIX) 20 MG tablet Take 0.5-1 tablets (10-20 mg total) by mouth daily as needed. 09/19/16   Karamalegos, Devonne Doughty, DO  gabapentin (NEURONTIN) 800 MG tablet Take 1 tablet (800 mg total) by mouth 2 (two) times daily. 09/24/16   Karamalegos, Devonne Doughty, DO  glipiZIDE-metformin (METAGLIP) 5-500 MG tablet TAKE 2 TABLETS IN THE MORNING AND 1 TABLET AT NIGHT 08/07/16   Karamalegos, Devonne Doughty, DO  guaiFENesin (MUCINEX) 600 MG 12 hr tablet Take 1 tablet (600 mg total) by mouth 2 (two) times daily as needed for cough or to loosen phlegm. 01/01/16   Hower, Aaron Mose, MD  levETIRAcetam (KEPPRA) 500 MG tablet Take 1 tablet (500 mg total) by mouth 2 (two) times daily. 09/19/15   Epifanio Lesches, MD  levothyroxine (SYNTHROID, LEVOTHROID) 175 MCG tablet Take 1 tablet (175 mcg total) by mouth daily before breakfast. 09/24/16   Karamalegos, Devonne Doughty, DO  lidocaine (LIDODERM) 5 % Place 1 patch onto the skin daily. 01/11/16   Daymon Larsen, MD  lisinopril (PRINIVIL,ZESTRIL) 20 MG tablet Take 1 tablet (20 mg total) by mouth daily. 10/02/16   Karamalegos, Devonne Doughty, DO  nitroGLYCERIN (NITROSTAT) 0.4 MG SL tablet Place 1 tablet (0.4 mg total) under the tongue every 5 (five) minutes as needed for chest pain. 02/28/16   Bettey Costa, MD  potassium chloride (MICRO-K) 10 MEQ CR  capsule Take 1 capsule (10 mEq total) by mouth daily. 09/24/16   Karamalegos, Devonne Doughty, DO  umeclidinium bromide (INCRUSE ELLIPTA) 62.5 MCG/INH AEPB Inhale 1 puff into the lungs daily. 11/13/15   Karamalegos, Devonne Doughty, DO    Allergies Mushroom extract complex; Atenolol; Ivp dye [iodinated diagnostic agents]; Morphine and related; Percocet [oxycodone-acetaminophen]; Percodan [oxycodone-aspirin]; and Verapamil  Family History  Problem Relation Age of Onset  . Breast cancer Mother   . Heart disease Mother   . Stroke Mother   . Cancer Mother   . COPD Mother   . Heart disease Father   . Diabetes Father   . Stroke Father   . Alcohol abuse Sister   . Drug abuse Sister   . Stroke Sister   . Cancer Sister   . Mental illness Sister   . Heart disease Brother   .  Arthritis Brother   . Diabetes Brother   . Heart disease Maternal Grandfather   . Heart disease Paternal Grandfather     Social History Social History  Substance Use Topics  . Smoking status: Current Every Day Smoker    Packs/day: 0.25    Years: 50.00    Types: Cigarettes  . Smokeless tobacco: Current User  . Alcohol use No    Review of Systems Constitutional: No fever/chills Eyes: No visual changes. ENT: No sore throat. No stiff neck no neck pain Cardiovascular: Denies chest pain. Respiratory: Denies change in chronic shortness of breath. Gastrointestinal:   no vomiting.  No diarrhea.  No constipation. Genitourinary: Negative for dysuria. Musculoskeletal: Negative lower extremity swelling Skin: Negative for rash. Neurological: Negative for severe headaches, + focal weakness.   ____________________________________________   PHYSICAL EXAM:  VITAL SIGNS: ED Triage Vitals  Enc Vitals Group     BP 10/03/16 1434 (!) 143/55     Pulse Rate 10/03/16 1434 71     Resp 10/03/16 1434 16     Temp 10/03/16 1434 97.9 F (36.6 C)     Temp Source 10/03/16 1434 Oral     SpO2 10/03/16 1434 100 %     Weight 10/03/16  1434 169 lb (76.7 kg)     Height 10/03/16 1434 5\' 1"  (1.549 m)     Head Circumference --      Peak Flow --      Pain Score 10/03/16 1433 8     Pain Loc --      Pain Edu? --      Excl. in [place]? --     Constitutional: Alert and oriented. Well appearing and in no acute distress. Eyes: Conjunctivae are normal Head: Atraumatic HEENT: No congestion/rhinnorhea. Mucous membranes are moist.  Oropharynx non-erythematous Neck:   Nontender with no meningismus, no masses, no stridor Cardiovascular: Normal rate, regular rhythm. Grossly normal heart sounds.  Good peripheral circulation. Respiratory: Normal respiratory effort.  No retractions. Lungs CTAB. Abdominal: Soft and nontender. No distention. No guarding no rebound Back:  There is no focal tenderness or step off.  there is no midline tenderness there are no lesions noted. there is no CVA tenderness Musculoskeletal: No lower extremity tenderness, no upper extremity tenderness. No joint effusions, no DVT signs strong distal pulses no edema Neurologic:  Patient with left-sided facial droop, left upper extremity weakness approximately 4+ and left lower showed a weakness 4+, sensation appears to be intact, no visual field cuts, speech is normal, she is oriented, aside from the left-sided weakness, no other deficits noted. Cranial nerves intact aside from left facial droop finger to nose within normal limits relation within normal limits, cannulation deferred Skin:  Skin is warm, dry and intact. No rash noted. Psychiatric: Mood and affect are normal. Speech and behavior are normal.  ____________________________________________   LABS (all labs ordered are listed, but only abnormal results are displayed)  Labs Reviewed  BASIC METABOLIC PANEL - Abnormal; Notable for the following:       Result Value   Glucose, Bld 141 (*)    All other components within normal limits  CBC - Abnormal; Notable for the following:    RDW 15.2 (*)    All other components  within normal limits  URINALYSIS, COMPLETE (UACMP) WITH MICROSCOPIC  TROPONIN I  BRAIN NATRIURETIC PEPTIDE  CBG MONITORING, ED    Pertinent labs  results that were available during my care of the patient were reviewed by me and considered in  my medical decision making (see chart for details). ____________________________________________  EKG  I personally interpreted any EKGs ordered by me or triage Sinus rhythm rate 70 bpm no acute ST or vision depression normal axis ____________________________________________  RADIOLOGY  Pertinent labs & imaging results that were available during my care of the patient were reviewed by me and considered in my medical decision making (see chart for details). If possible, patient and/or family made aware of any abnormal findings. ____________________________________________    PROCEDURES  Procedure(s) performed: None  Procedures  Critical Care performed: None  ____________________________________________   INITIAL IMPRESSION / ASSESSMENT AND PLAN / ED COURSE  Pertinent labs & imaging results that were available during my care of the patient were reviewed by me and considered in my medical decision making (see chart for details).  Visual history of recurrent CVA, on blood thinners, presents today with left-sided weakness which she believes began last night sometime. She does have focal left-sided weakness which is new according to patient. The patient is not a candidate for TPA for multiple reasons including one already on anticoagulation, two, time of onset. However, she will be admitted for further observation and she will require physical therapy etc. as she does live by herself    ____________________________________________   FINAL CLINICAL IMPRESSION(S) / ED DIAGNOSES  Final diagnoses:  CVA (cerebral vascular accident) [facility])      This chart was dictated using voice recognition software.  Despite best efforts to proofread,   errors can occur which can change meaning.      Schuyler Amor, MD 10/03/16 [phone]

## 2016-10-03 NOTE — ED Notes (Signed)
Dr. Burlene Arnt notified of NIH of 11 and VAN positive. MD at bedside at this time.

## 2016-10-03 NOTE — ED Notes (Signed)
Patient ambulatory to commode with 1x staff assist.

## 2016-10-03 NOTE — ED Notes (Signed)
Patient in CT at this time.

## 2016-10-03 NOTE — ED Notes (Signed)
Unable to print yellow sample label. Samantha Aguirre, from lab states it is ok to send to white chart label on urine sample at this time.

## 2016-10-03 NOTE — ED Triage Notes (Signed)
Pt brought in by [facility] from home for left sided weakness, unknown last known well time. Pt states that she had a stroke in December or January. Pt also c/o headache that started about 30 minutes ago. Pt reports being lightheaded all morning. Pt A & O on triage. Pt has left sided facial droop.

## 2016-10-04 ENCOUNTER — Observation Stay: Payer: Medicare Other

## 2016-10-04 ENCOUNTER — Observation Stay (HOSPITAL_BASED_OUTPATIENT_CLINIC_OR_DEPARTMENT_OTHER)
Admit: 2016-10-04 | Discharge: 2016-10-04 | Disposition: A | Discharge status: Home / Self Care | Payer: Medicare Other | Attending: Internal Medicine | Admitting: Internal Medicine

## 2016-10-04 DIAGNOSIS — I482 Chronic atrial fibrillation: Secondary | ICD-10-CM | POA: Diagnosis not present

## 2016-10-04 DIAGNOSIS — Z8673 Personal history of transient ischemic attack (TIA), and cerebral infarction without residual deficits: Secondary | ICD-10-CM | POA: Diagnosis not present

## 2016-10-04 DIAGNOSIS — I5032 Chronic diastolic (congestive) heart failure: Secondary | ICD-10-CM | POA: Diagnosis not present

## 2016-10-04 DIAGNOSIS — R531 Weakness: Secondary | ICD-10-CM | POA: Diagnosis not present

## 2016-10-04 DIAGNOSIS — R51 Headache: Secondary | ICD-10-CM | POA: Diagnosis not present

## 2016-10-04 DIAGNOSIS — R4781 Slurred speech: Secondary | ICD-10-CM | POA: Diagnosis not present

## 2016-10-04 DIAGNOSIS — I517 Cardiomegaly: Secondary | ICD-10-CM

## 2016-10-04 DIAGNOSIS — G459 Transient cerebral ischemic attack, unspecified: Secondary | ICD-10-CM | POA: Diagnosis not present

## 2016-10-04 LAB — ECHOCARDIOGRAM COMPLETE
Height: 61 in
WEIGHTICAEL: 2942.4 [oz_av]

## 2016-10-04 LAB — HEMOGLOBIN A1C
HEMOGLOBIN A1C: 6 % — AB (ref 4.8–5.6)
MEAN PLASMA GLUCOSE: 125.5 mg/dL

## 2016-10-04 LAB — LIPID PANEL
CHOLESTEROL: 159 mg/dL (ref 0–200)
HDL: 41 mg/dL (ref >40)
LDL Cholesterol: 94 mg/dL (ref 0–99)
Total CHOL/HDL Ratio: 3.9 RATIO
Triglycerides: 120 mg/dL (ref <150)
VLDL: 24 mg/dL (ref 0–40)

## 2016-10-04 MED ORDER — SODIUM CHLORIDE 0.9% FLUSH
3.0000 mL | Freq: Two times a day (BID) | INTRAVENOUS | Status: DC
  Administered 2016-10-04: 3 mL via INTRAVENOUS

## 2016-10-04 MED ORDER — ALUM & MAG HYDROXIDE-SIMETH 200-200-20 MG/5ML PO SUSP
30.0000 mL | ORAL | Status: DC | PRN
  Administered 2016-10-04: 01:00:00 30 mL via ORAL
  Filled 2016-10-04: qty 30

## 2016-10-04 MED ORDER — ACETAMINOPHEN 500 MG PO TABS
1000.0000 mg | ORAL_TABLET | Freq: Three times a day (TID) | ORAL | Status: DC | PRN
  Administered 2016-10-04: 03:00:00 1000 mg via ORAL
  Filled 2016-10-04: qty 2

## 2016-10-04 NOTE — Discharge Instructions (Addendum)
[facility] ° °DIET:  °Cardiac diet ° °DISCHARGE CONDITION:  °Stable ° °ACTIVITY:  °Activity as tolerated ° °OXYGEN:  °Home Oxygen: No. °  °Oxygen Delivery: room air ° °DISCHARGE LOCATION:  °home  ° ° °ADDITIONAL DISCHARGE INSTRUCTION: ° ° °If you experience worsening of your admission symptoms, develop shortness of breath, life threatening emergency, suicidal or homicidal thoughts you must seek medical attention immediately by calling 911 or calling your MD immediately  if symptoms less severe. ° °You Must read complete instructions/literature along with all the possible adverse reactions/side effects for all the Medicines you take and that have been prescribed to you. Take any new Medicines after you have completely understood and accpet all the possible adverse reactions/side effects.  ° °Please note ° °You were cared for by a hospitalist during your hospital stay. If you have any questions about your discharge medications or the care you received while you were in the hospital after you are discharged, you can call the unit and asked to speak with the hospitalist on call if the hospitalist that took care of you is not available. Once you are discharged, your primary care physician will handle any further medical issues. Please note that NO REFILLS for any discharge medications will be authorized once you are discharged, as it is imperative that you return to your primary care physician (or establish a relationship with a primary care physician if you do not have one) for your aftercare needs so that they can reassess your need for medications and monitor your lab values. ° ° °

## 2016-10-04 NOTE — Progress Notes (Signed)
*  PRELIMINARY RESULTS* Echocardiogram 2D Echocardiogram has been performed.  Lavell Luster Jonpaul Lumm 10/04/2016, 12:48 PM

## 2016-10-04 NOTE — Evaluation (Signed)
SLP Cancellation Note  Patient Details Name: Samantha Aguirre MRN: [id number] DOB: 1945-09-16   Cancelled treatment:       Reason Eval/Treat Not Completed: SLP screened, no needs identified, will sign off. SLP spoke to nursing and pt and present for medication intake. Pt/ nsg report no difficulties with speech or swallow. SLP noted no slurred speech or difficulty with medication or liquid intake.    [place]ali Sauber 10/04/2016, 9:32 AM

## 2016-10-04 NOTE — Progress Notes (Signed)
Stroke workup completed. States she is ready to go home with home PT.

## 2016-10-04 NOTE — Evaluation (Signed)
Physical Therapy Evaluation Patient Details Name: Samantha Aguirre MRN: [id number] DOB: 10-01-1945 Today's Date: 10/04/2016   History of Present Illness  Elizabethanne Lusher is a 71yo white female who comes to [facility] on 9/21 after insidious onset HA, left sided weakness, and slurred speech (no resolved). PMH: HTN, AF, DCHF, COPD, CAD, CVA (cerebellar), seizures, HA. At baseline pt lives alone, performing household to limited community distances only with RW/QC, and power WC for community access. Pt gets rides to grocery from friend Tye [place] or sometiems sister Kalman Shan in [place].   Clinical Impression  Pt admitted with above diagnosis. Pt currently with functional limitations due to the deficits listed below (see "PT Problem List"). Pt will mild reported paresthesia LLE/LUE, 5/5 strength LLE, symmetrical hip/knee/ankle mechanics in AMB, turns, and transfers. Pt reports additional weakness on left side continues, worse than baseline HA which remains. AMB is performed at safe household distances with RW but with increased fatigue, mildly impaired compared to baseline. Pt will benefit from skilled PT intervention to increase independence and safety with basic mobility in preparation for discharge to the venue listed below.       Follow Up Recommendations Home health PT (prefers [facility] )    Equipment Recommendations  None recommended by PT    Recommendations for Other Services       Precautions / Restrictions Precautions Precautions: Fall Restrictions Weight Bearing Restrictions: No      Mobility  Bed Mobility               General bed mobility comments: received in chair   Transfers Overall transfer level: Needs assistance Equipment used: Rolling walker (2 wheeled) Transfers: Sit to/from Stand Sit to Stand: Min guard         General transfer comment: good controlled descent  Ambulation/Gait Ambulation/Gait assistance: Supervision Ambulation Distance (Feet): 80  Feet Assistive device: Rolling walker (2 wheeled)       General Gait Details: equal but limited dorsiflexion at heel strike bilat, not signififcant weight bearing assymetry with gait.   Stairs            Wheelchair Mobility    Modified Rankin (Stroke Patients Only)       Balance Overall balance assessment: History of Falls;Modified Independent   Sitting balance-Leahy Scale: Good       Standing balance-Leahy Scale: Good                               Pertinent Vitals/Pain Pain Assessment: 0-10 Pain Score: 8  Pain Location: headache, central frontal, left temporal, intermittent referral to Lt ear.   Pain Descriptors / Indicators: Aching Pain Intervention(s): Limited activity within patient's tolerance    Home Living Family/patient expects to be discharged to:: Private residence Living Arrangements: Alone Available Help at Discharge: Family;Available PRN/intermittently Type of Home: Apartment   Entrance Stairs-Rails: None Entrance Stairs-Number of Steps: none (1 partial 2" step at egress, limiting only to [facility] access)  Home Layout: One level Home Equipment: Walker - 4 wheels;Cane - quad;Wheelchair - power;Bedside commode;Shower seat Additional Comments: No using power chair due to 2" step at egress of new apartment     Prior Function Level of Independence: Needs assistance   Gait / Transfers Assistance Needed: ambulates with a cane outside, has a power wheelchair at her dtrs home but has not used it in a while.   ADL's / Homemaking Assistance Needed: Reports independence with ADL, but needs additional time and  rest to perform IADL, housework.         Hand Dominance   Dominant Hand: Right    Extremity/Trunk Assessment   Upper Extremity Assessment Upper Extremity Assessment: Defer to OT evaluation LUE Deficits / Details: Left shoulder flexion to 90 degrees, ABD to 90, elbow flexion WNLs, elbow extension -15 actively.  Decreased coordination in  left hand, slow with opposition, rapid alternating movements and finger to nose. RUE WFLs. LUE Coordination: decreased fine motor;decreased gross motor    Lower Extremity Assessment Lower Extremity Assessment: Overall WFL for tasks assessed;LLE deficits/detail LLE Deficits / Details: bradykinetic A/ROM when tested, but near symmetrical in gait and transfers. 5/5        Communication   Communication: No difficulties  Cognition Arousal/Alertness: Awake/alert Behavior During Therapy: WFL for tasks assessed/performed Overall Cognitive Status: Within Functional Limits for tasks assessed                                 General Comments: Patient reports increased difficulty with managing medication, feels she needs it organized and placed into pill organizer in the future if possible.       General Comments      Exercises     Assessment/Plan    PT Assessment Patient needs continued PT services  PT Problem List Decreased strength;Decreased activity tolerance;Decreased mobility;Decreased balance;Decreased coordination       PT Treatment Interventions Gait training;Functional mobility training;Therapeutic activities;Therapeutic exercise;Balance training;Patient/family education    PT Goals (Current goals can be found in the Care Plan section)  Acute Rehab PT Goals Patient Stated Goal: Patient reports her goal is to be able to take care of herself and go back to her apartment PT Goal Formulation: With patient Time For Goal Achievement: 10/18/16 Potential to Achieve Goals: Good    Frequency Min 2X/week   Barriers to discharge Decreased caregiver support      Co-evaluation               AM-PAC PT "6 Clicks" Daily Activity  Outcome Measure Difficulty turning over in bed (including adjusting bedclothes, sheets and blankets)?: A Lot Difficulty moving from lying on back to sitting on the side of the bed? : A Lot Difficulty sitting down on and standing up from  a chair with arms (e.g., wheelchair, bedside commode, etc,.)?: A Lot Help needed moving to and from a bed to chair (including a wheelchair)?: A Little Help needed walking in hospital room?: A Little Help needed climbing 3-5 steps with a railing? : A Little 6 Click Score: 15    End of Session Equipment Utilized During Treatment: Gait belt Activity Tolerance: Patient tolerated treatment well;Patient limited by fatigue Patient left: in chair Nurse Communication: Mobility status PT Visit Diagnosis: Other abnormalities of gait and mobility (R26.89);Difficulty in walking, not elsewhere classified (R26.2);Muscle weakness (generalized) (M62.81)    Time: 3244-0102 PT Time Calculation (min) (ACUTE ONLY): 12 min   Charges:   PT Evaluation $PT Eval Moderate Complexity: 1 Mod     PT G Codes:   PT G-Codes **NOT FOR INPATIENT CLASS** Functional Assessment Tool Used: AM-PAC 6 Clicks Basic Mobility Functional Limitation: Mobility: Walking and moving around Mobility: Walking and Moving Around Current Status (V2536): At least 40 percent but less than 60 percent impaired, limited or restricted Mobility: Walking and Moving Around Goal Status [phone]): At least 40 percent but less than 60 percent impaired, limited or restricted    10:54 AM,  10/04/16 Etta Grandchild, PT, DPT Physical Therapist - [place] [phone])  [phone] (mobile)    Buccola,Allan C 10/04/2016, 10:52 AM

## 2016-10-04 NOTE — Care Management Note (Addendum)
Case Management Note  Patient Details  Name: Anaih Brander MRN: [id number] Date of Birth: 1945/07/30  Subjective/Objective:       Ms Mottley requested home health services with [facility]. A referral was called to [facility] requesting HH-PT, Aide. Someone from [facility] will contact Ms Mcfate at her home number within 24-48 hours to schedule an appointment.             Action/Plan:   Expected Discharge Date:  10/04/16               Expected Discharge Plan:  [place]  In-House Referral:  NA  Discharge planning Services  CM Consult  Post Acute Care Choice:  NA Choice offered to:  Patient  DME Arranged:  N/A DME Agency:  NA  HH Arranged:  PT, Nurse's Aide [place] Agency:  [facility]  Status of Service:  Completed, signed off  If discussed at [place] of Stay Meetings, dates discussed:    Additional Comments:  Jessabelle Markiewicz A, RN 10/04/2016, 12:43 PM

## 2016-10-04 NOTE — Discharge Summary (Signed)
Samantha Aguirre at [facility], 71 y.o., DOB 05/23/1945, MRN [id number]. Admission date: 10/03/2016 Discharge Date 10/04/2016 Primary MD Olin Hauser, DO Admitting Physician Demetrios Loll, MD  Admission Diagnosis  CVA (cerebral vascular accident) [facility]) [I63.9] Left-sided weakness [R53.1]  Discharge Diagnosis   Active Problems:   Left-sided weakness suspect due to TIA stroke ruled out   Previous history of CVA   Chronic atrial fibrillation  chronic Diastolic CHF Coronary artery disease Hyperlipidemia Essential hypertension History of seizures     Hospital Course  Samantha Aguirre  is a 71 y.o. female with a known history of Hypertension, A. fib, diastolic CHF, COPD, CAD, CVA, hyperlipidemia and seizure disorder.patient was seen in the ED and was admitted for stroke rule out. She underwent a CT scan of the head which was negative. MRI of the brain confirmed the patient did not have an acute CVA. She does have history of atrial fibrillation and is on Ella Quist which was continued. Patient was seen by PT and seems to be back at baseline.  Home health has been ordered for the patient.          Consults  None  Significant Tests:  See full reports for all details     Ct Head Wo Contrast  Result Date: 10/03/2016 CLINICAL DATA:  Left-sided weakness, left facial droop and headache for the past 30 minutes. EXAM: CT HEAD WITHOUT CONTRAST TECHNIQUE: Contiguous axial images were obtained from the base of the skull through the vertex without intravenous contrast. COMPARISON:  04/18/2016. FINDINGS: Brain: Diffusely enlarged ventricles and subarachnoid spaces. Patchy white matter low density in both cerebral hemispheres. Stable bilateral cerebellar hemisphere infarcts. No intracranial hemorrhage, mass lesion or CT evidence of acute infarction. Vascular: No hyperdense vessel or unexpected calcification. Skull: Normal. Negative for fracture or focal  lesion. Sinuses/Orbits: Unremarkable. Other: None. IMPRESSION: 1. No acute abnormality. 2. Stable atrophy, chronic small vessel white matter ischemic changes and old bilateral cerebellar hemisphere infarcts. Electronically Signed   By: Claudie Revering M.D.   On: 10/03/2016 15:02   [name]  Result Date: 10/04/2016 CLINICAL DATA:  Slight headache beginning last night, LEFT-sided weakness and slurred speech today. History of atrial fibrillation on Eliquis, CHF, stroke and seizures. EXAM: MRI HEAD WITHOUT CONTRAST MRA HEAD WITHOUT CONTRAST TECHNIQUE: Multiplanar, multiecho pulse sequences of the brain and surrounding structures were obtained without intravenous contrast. Angiographic images of the head were obtained using MRA technique without contrast. COMPARISON:  CT HEAD October 03, 2016 and MRI/MRA head February 19, 2016 FINDINGS: MRI HEAD FINDINGS- multiple sequences are moderately motion degraded. BRAIN: No reduced diffusion to suggest acute ischemia. No susceptibility artifact to suggest hemorrhage. The ventricles and sulci are normal for patient's age. Old bilateral cerebellar infarcts. Patchy to confluent supratentorial white matter FLAIR T2 hyperintensities. No suspicious parenchymal signal, mass or mass effect. No abnormal extra-axial fluid collections. Patient's known small meningiomas not conspicuous by noncontrast MRI. VASCULAR: Normal major intracranial vascular flow voids present at skull base. SKULL AND UPPER CERVICAL SPINE: No abnormal sellar expansion. No suspicious calvarial bone marrow signal. Craniocervical junction maintained. SINUSES/ORBITS: The mastoid air-cells and included paranasal sinuses are well-aerated. The included ocular globes and orbital contents are non-suspicious. OTHER: None. MRA HEAD FINDINGS- moderately motion degraded examination. ANTERIOR CIRCULATION: Normal flow related enhancement of the included cervical, petrous, cavernous and supraclinoid internal carotid  arteries. Stable mild irregularity bilateral carotid termini compatible with atherosclerosis. Patent anterior communicating artery. Patent anterior and middle  cerebral arteries, including distal segments. POSTERIOR CIRCULATION: Limited assessment of vertebral arteries due to motion. RIGHT posterior-inferior cerebellar artery not well documented, unchanged from prior imaging. Basilar artery is patent, with normal flow related enhancement of the main branch vessels. Patent posterior cerebral arteries. Robust bilateral posterior communicating artery's, potential fetal origin. ANATOMIC VARIANTS: None. Source images and MIP images were reviewed. IMPRESSION: MRI HEAD: 1. No acute intracranial process. 2. Stable examination including moderate to severe chronic small vessel ischemic disease and old cerebellar infarcts. MRA HEAD: 1. No emergent large vessel occlusion or flow limiting stenosis on this moderately motion degraded examination. Electronically Signed   By: Elon Alas M.D.   On: 10/04/2016 02:38   Dg Chest Port 1 View  Result Date: 10/03/2016 CLINICAL DATA:  Left-sided weakness.  Smoker. EXAM: PORTABLE CHEST 1 VIEW COMPARISON:  02/27/2016. FINDINGS: Stable mildly enlarged cardiac silhouette. Clear lungs with normal vascularity. Stable small left upper lobe calcified granuloma. Thoracic spine degenerative changes. IMPRESSION: No acute abnormality.  Stable mild cardiomegaly. Electronically Signed   By: Claudie Revering M.D.   On: 10/03/2016 17:32   Mr Samantha Aguirre Head/brain VO Cm  Result Date: 10/04/2016 CLINICAL DATA:  Slight headache beginning last night, LEFT-sided weakness and slurred speech today. History of atrial fibrillation on Eliquis, CHF, stroke and seizures. EXAM: MRI HEAD WITHOUT CONTRAST MRA HEAD WITHOUT CONTRAST TECHNIQUE: Multiplanar, multiecho pulse sequences of the brain and surrounding structures were obtained without intravenous contrast. Angiographic images of the head were obtained using MRA  technique without contrast. COMPARISON:  CT HEAD October 03, 2016 and MRI/MRA head February 19, 2016 FINDINGS: MRI HEAD FINDINGS- multiple sequences are moderately motion degraded. BRAIN: No reduced diffusion to suggest acute ischemia. No susceptibility artifact to suggest hemorrhage. The ventricles and sulci are normal for patient's age. Old bilateral cerebellar infarcts. Patchy to confluent supratentorial white matter FLAIR T2 hyperintensities. No suspicious parenchymal signal, mass or mass effect. No abnormal extra-axial fluid collections. Patient's known small meningiomas not conspicuous by noncontrast MRI. VASCULAR: Normal major intracranial vascular flow voids present at skull base. SKULL AND UPPER CERVICAL SPINE: No abnormal sellar expansion. No suspicious calvarial bone marrow signal. Craniocervical junction maintained. SINUSES/ORBITS: The mastoid air-cells and included paranasal sinuses are well-aerated. The included ocular globes and orbital contents are non-suspicious. OTHER: None. MRA HEAD FINDINGS- moderately motion degraded examination. ANTERIOR CIRCULATION: Normal flow related enhancement of the included cervical, petrous, cavernous and supraclinoid internal carotid arteries. Stable mild irregularity bilateral carotid termini compatible with atherosclerosis. Patent anterior communicating artery. Patent anterior and middle cerebral arteries, including distal segments. POSTERIOR CIRCULATION: Limited assessment of vertebral arteries due to motion. RIGHT posterior-inferior cerebellar artery not well documented, unchanged from prior imaging. Basilar artery is patent, with normal flow related enhancement of the main branch vessels. Patent posterior cerebral arteries. Robust bilateral posterior communicating artery's, potential fetal origin. ANATOMIC VARIANTS: None. Source images and MIP images were reviewed. IMPRESSION: MRI HEAD: 1. No acute intracranial process. 2. Stable examination including moderate to  severe chronic small vessel ischemic disease and old cerebellar infarcts. MRA HEAD: 1. No emergent large vessel occlusion or flow limiting stenosis on this moderately motion degraded examination. Electronically Signed   By: Elon Alas M.D.   On: 10/04/2016 02:38       Today   Subjective:   [name]  Patient still has some weakness but seems to be back at baseline  Objective:   Blood pressure (!) 146/59, pulse 71, temperature 98.4 F (36.9 C), temperature source Oral, resp. rate 20,  height 5\' 1"  (1.549 m), weight 183 lb 14.4 oz (83.4 kg), SpO2 96 %.  .  Intake/Output Summary (Last 24 hours) at 10/04/16 1252 Last data filed at 10/04/16 1020  Gross per 24 hour  Intake           996.25 ml  Output                0 ml  Net           996.25 ml    Exam VITAL SIGNS: Blood pressure (!) 146/59, pulse 71, temperature 98.4 F (36.9 C), temperature source Oral, resp. rate 20, height 5\' 1"  (1.549 m), weight 183 lb 14.4 oz (83.4 kg), SpO2 96 %.  GENERAL:  71 y.o.-year-old patient lying in the bed with no acute distress.  EYES: Pupils equal, round, reactive to light and accommodation. No scleral icterus. Extraocular muscles intact.  HEENT: Head atraumatic, normocephalic. Oropharynx and nasopharynx clear.  NECK:  Supple, no jugular venous distention. No thyroid enlargement, no tenderness.  LUNGS: Normal breath sounds bilaterally, no wheezing, rales,rhonchi or crepitation. No use of accessory muscles of respiration.  CARDIOVASCULAR: S1, S2 normal. No murmurs, rubs, or gallops.  ABDOMEN: Soft, nontender, nondistended. Bowel sounds present. No organomegaly or mass.  EXTREMITIES: No pedal edema, cyanosis, or clubbing.  NEUROLOGIC: Cranial nerves II through XII are intact. Muscle strength 4/5 in left lower ext. Sensation intact. Gait not checked.  PSYCHIATRIC: The patient is alert and oriented x 3.  SKIN: No obvious rash, lesion, or ulcer.   Data Review     CBC w Diff: Lab Results   Component Value Date   WBC 7.3 10/03/2016   HGB 12.0 10/03/2016   HCT 35.0 10/03/2016   PLT 287 10/03/2016   LYMPHOPCT 42 04/18/2016   MONOPCT 6 04/18/2016   EOSPCT 1 04/18/2016   BASOPCT 1 04/18/2016   CMP: Lab Results  Component Value Date   NA 142 10/03/2016   K 3.8 10/03/2016   CL 107 10/03/2016   CO2 25 10/03/2016   BUN 12 10/03/2016   CREATININE 0.69 10/03/2016   CREATININE 0.78 11/20/2015   PROT 6.8 04/18/2016   ALBUMIN 4.0 04/18/2016   BILITOT 0.8 04/18/2016   ALKPHOS 74 04/18/2016   AST 21 04/18/2016   ALT 12 (L) 04/18/2016  .  Micro Results No results found for this or any previous visit (from the past 240 hour(s)).      Code Status Orders        Start     Ordered   10/03/16 1842  Full code  Continuous     10/03/16 1841    Code Status History    Date Active Date Inactive Code Status Order ID Comments User Context   02/27/2016  5:44 PM 02/28/2016  8:34 PM Full Code [id number]  Vaughan Basta, MD Inpatient   02/19/2016  3:53 PM 02/21/2016  8:41 PM Full Code [id number]  Gladstone Lighter, MD Inpatient   12/27/2015  1:09 PM 01/01/2016  6:33 PM DNR [id number]  Loletha Grayer, MD ED   09/16/2015  6:52 PM 09/19/2015  7:29 PM Full Code [id number]  Baxter Hire, MD Inpatient   08/16/2015  5:14 PM 08/19/2015  5:35 PM DNR [id number]  Willia Craze, NP Inpatient            Discharge Medications   Allergies as of 10/04/2016      Reactions   Mushroom Extract Complex Anaphylaxis   Made patient "deathly sick"   Atenolol    "  MD took me off of it bc it was doing something wrong" Made patient HYPOTENSIVE   Ivp Dye [iodinated Diagnostic Agents] Itching   Pt injected with IV contrast.  5 min after injection pt complained of itching behind ear and on her abd.   Morphine And Related Other (See Comments)   "stops my breathing" NEAR-RESPIRATORY ARREST   Percocet [oxycodone-acetaminophen] Nausea And Vomiting, Other (See Comments)   Stomach pains   Percodan  [oxycodone-aspirin] Nausea And Vomiting, Other (See Comments)   Stomach pains   Verapamil Other (See Comments)   " MD told me this was screwing me up" MAKES PATIENT HYPOTENSIVE      Medication List    TAKE these medications   albuterol 108 (90 Base) MCG/ACT inhaler Commonly known as:  PROVENTIL HFA;VENTOLIN HFA Inhale 2 puffs into the lungs every 6 (six) hours as needed for wheezing or shortness of breath.   apixaban 5 MG Tabs tablet Commonly known as:  ELIQUIS Take 5 mg by mouth every 12 (twelve) hours.   atorvastatin 80 MG tablet Commonly known as:  LIPITOR Take 1 tablet (80 mg total) by mouth daily at 6 PM.   baclofen 10 MG tablet Commonly known as:  LIORESAL Take 0.5-1 tablets (5-10 mg total) by mouth 3 (three) times daily as needed for muscle spasms.   diclofenac sodium 1 % Gel Commonly known as:  VOLTAREN Apply 2 g topically 4 (four) times daily.   DULoxetine 60 MG capsule Commonly known as:  CYMBALTA Take 1 capsule (60 mg total) by mouth at bedtime.   fluticasone 50 MCG/ACT nasal spray Commonly known as:  FLONASE Place 1 spray into both nostrils daily.   fluticasone furoate-vilanterol 100-25 MCG/INH Aepb Commonly known as:  BREO ELLIPTA Inhale 1 puff into the lungs daily. What changed:  when to take this  reasons to take this  additional instructions   furosemide 20 MG tablet Commonly known as:  LASIX Take 10 mg by mouth daily.   gabapentin 800 MG tablet Commonly known as:  NEURONTIN Take 1 tablet (800 mg total) by mouth 2 (two) times daily.   glipiZIDE-metformin 5-500 MG tablet Commonly known as:  METAGLIP Take 1-2 tablets by mouth 2 (two) times daily. 2 tablets every morning and 1 tablet at bedtime   levETIRAcetam 500 MG tablet Commonly known as:  KEPPRA Take 1 tablet (500 mg total) by mouth 2 (two) times daily.   levothyroxine 175 MCG tablet Commonly known as:  SYNTHROID, LEVOTHROID Take 1 tablet (175 mcg total) by mouth daily before  breakfast.   lisinopril 20 MG tablet Commonly known as:  PRINIVIL,ZESTRIL Take 1 tablet (20 mg total) by mouth daily.   nitroGLYCERIN 0.4 MG SL tablet Commonly known as:  NITROSTAT Place 1 tablet (0.4 mg total) under the tongue every 5 (five) minutes as needed for chest pain.   potassium chloride 10 MEQ CR capsule Commonly known as:  MICRO-K Take 1 capsule (10 mEq total) by mouth daily.   TYLENOL 325 MG tablet Generic drug:  acetaminophen Take 650 mg by mouth 2 (two) times daily.   umeclidinium bromide 62.5 MCG/INH Aepb Commonly known as:  INCRUSE ELLIPTA Inhale 1 puff into the lungs daily.          Total Time in preparing paper work, data evaluation and todays exam - 35 minutes  Dustin Flock M.D on 10/04/2016 at 12:52 PM  [facility]   Office  [phone]

## 2016-10-04 NOTE — Progress Notes (Signed)
Oral and written AVS instructions given with stated understanding; nasal spray and inhaler sent home with  Stroke education/booklet. Educated on smoking cessation but states she is not going to change. Discharge home with [facility] services. Pt transported in transport chair to private vehicle.

## 2016-10-04 NOTE — Evaluation (Signed)
Occupational Therapy Evaluation Patient Details Name: Samantha Aguirre MRN: [id number] DOB: 11/10/1945 Today's Date: 10/04/2016    History of Present Illness Nyari Olsson  is a 71 y.o. female with a known history of Hypertension, A. fib, diastolic CHF, COPD, CAD, CVA, hyperlipidemia and seizure disorder. The patient to present to the ED with the above chief complaints. She has a history of recurrent CVA. She started to have slight headache last night. But he feels left-sided weakness since this morning. She also complains of slurred speech this morning, which resolved later. Her CAT scan of the head didn't show any acute CVA. She has history of A. fib, on Eliquis.    Clinical Impression   Patient seen for OT evaluation this date.  She has recently moved into an apartment where she lives alone.  She has neighbors who help her at times and one who will take her to the grocery store.  She was modified independent with basic self care tasks but requires assist with shopping, laundry and with any extensive meal prep.  She performs light homemaking tasks and has applied for meals on wheels and is on the waiting list.  She reports decreased memory which affects her ability to manage her medications and would like for her medication to be organized for her to take in the future.  She presents with muscle weakness, lack of coordination in left UE with slow opposition, finger to nose and rapid alternating movements.  She requires min guard for transfers and min assist with lower body self care. She would benefit from skilled OT to maximize her safety and independence in self care tasks. Recommend home health OT at discharge.    Follow Up Recommendations  Home health OT    Equipment Recommendations       Recommendations for Other Services       Precautions / Restrictions Precautions Precautions: Fall Restrictions Weight Bearing Restrictions: No      Mobility Bed Mobility                General bed mobility comments: up to chair this date  Transfers Overall transfer level: Needs assistance   Transfers: Sit to/from Stand Sit to Stand: Min guard              Balance Overall balance assessment: Needs assistance   Sitting balance-Leahy Scale: Good       Standing balance-Leahy Scale: Good                             ADL either performed or assessed with clinical judgement   ADL Overall ADL's : Needs assistance/impaired Eating/Feeding: Modified independent   Grooming: Modified independent   Upper Body Bathing: Modified independent   Lower Body Bathing: Minimal assistance   Upper Body Dressing : Modified independent   Lower Body Dressing: Minimal assistance   Toilet Transfer: Min guard   Toileting- Clothing Manipulation and Hygiene: Min guard               Vision Baseline Vision/History: Wears glasses Wears Glasses: Reading only       Perception     Praxis      Pertinent Vitals/Pain Pain Assessment: 0-10 Pain Score: 8  Pain Location: headache and knee pain with standing Pain Descriptors / Indicators: Aching Pain Intervention(s): Limited activity within patient's tolerance;Monitored during session;Repositioned     Hand Dominance Right   Extremity/Trunk Assessment Upper Extremity Assessment Upper Extremity Assessment: Generalized weakness;LUE deficits/detail  LUE Deficits / Details: Left shoulder flexion to 90 degrees, ABD to 90, elbow flexion WNLs, elbow extension -15 actively.  Decreased coordination in left hand, slow with opposition, rapid alternating movements and finger to nose. RUE WFLs. LUE Coordination: decreased fine motor;decreased gross motor   Lower Extremity Assessment Lower Extremity Assessment: Defer to PT evaluation       Communication Communication Communication: No difficulties   Cognition Arousal/Alertness: Awake/alert Behavior During Therapy: WFL for tasks assessed/performed Overall Cognitive  Status: Within Functional Limits for tasks assessed                                 General Comments: Patient reports increased difficulty with managing medication, feels she needs it organized and placed into pill organizer in the future if possible.    General Comments       Exercises     Shoulder Instructions      Home Living Family/patient expects to be discharged to:: Private residence Living Arrangements: Alone Available Help at Discharge: Family;Available PRN/intermittently Type of Home: Apartment   Entrance Stairs-Number of Steps: none   Home Layout: One level     Bathroom Shower/Tub: Tub/shower unit;Curtain   Bathroom Toilet: Standard Bathroom Accessibility: Yes   Home Equipment: Walker - 4 wheels;Wheelchair - power;Cane - single point   Additional Comments: has a power chair at her daughters home.        Prior Functioning/Environment Level of Independence: Needs assistance  Gait / Transfers Assistance Needed: ambulates with a cane outside, has a power wheelchair at her dtrs home but has not used it in a while.  ADL's / Homemaking Assistance Needed: Patient recently moved from her dtrs home to her own apartment and has neighbors who help her at times. She is able to perform basic self care tasks with bathing, dressing and grooming.  She has a friend who will drive her to the grocery store and she will shop for light meals.  She has applied for meal on wheels but is waiting for an opening.  She reports increased difficulty with cooking lately.  She will perform light homemaking tasks, tends to use paper plates for easy clean up and due to tremors. She requires assist with laundry.  She reports she feels she will likely need help with managing meds, increased difficulty with knowing what to take and when.  She likes to do crafts, crosswords and cooking when she is able.             OT Problem List: Decreased strength;Decreased knowledge of use of DME or  AE;Decreased range of motion;Decreased coordination;Impaired UE functional use;Pain      OT Treatment/Interventions: Self-care/ADL training;Therapeutic exercise;Patient/family education;Neuromuscular education;Therapeutic activities;DME and/or AE instruction    OT Goals(Current goals can be found in the care plan section) Acute Rehab OT Goals Patient Stated Goal: Patient reports her goal is to be able to take care of herself and go back to her apartment OT Goal Formulation: With patient Time For Goal Achievement: 10/11/16 Potential to Achieve Goals: Good  OT Frequency: Min 1X/week   Barriers to D/C:            Co-evaluation              AM-PAC PT "6 Clicks" Daily Activity     Outcome Measure Help from another person eating meals?: None Help from another person taking care of personal grooming?: None Help from another person toileting,  which includes using toliet, bedpan, or urinal?: A Little Help from another person bathing (including washing, rinsing, drying)?: A Little Help from another person to put on and taking off regular upper body clothing?: None Help from another person to put on and taking off regular lower body clothing?: A Little 6 Click Score: 21   End of Session Equipment Utilized During Treatment: Gait belt;Rolling walker  Activity Tolerance: Patient tolerated treatment well Patient left: in chair;with call bell/phone within reach  OT Visit Diagnosis: Muscle weakness (generalized) (M62.81);Pain Pain - Right/Left: Left Pain - part of body: Knee                Time: 2637-8588 OT Time Calculation (min): 28 min Charges:  OT General Charges $OT Visit: 1 Visit OT Evaluation $OT Eval Moderate Complexity: 1 Mod OT Treatments $Self Care/Home Management : 8-22 mins G-Codes: OT G-codes **NOT FOR INPATIENT CLASS** Functional Assessment Tool Used: AM-PAC 6 Clicks Daily Activity;Clinical judgement Functional Limitation: Self care Self Care Current Status  (F0277): At least 40 percent but less than 60 percent impaired, limited or restricted Self Care Goal Status (A1287): At least 20 percent but less than 40 percent impaired, limited or restricted   [name] T Lovett, OTR/L, CLT   Lovett,[name] 10/04/2016, 10:26 AM

## 2016-10-06 ENCOUNTER — Telehealth: Payer: Self-pay | Admitting: Family Medicine

## 2016-10-06 ENCOUNTER — Ambulatory Visit (INDEPENDENT_AMBULATORY_CARE_PROVIDER_SITE_OTHER): Payer: Medicare Other | Admitting: Family Medicine

## 2016-10-06 ENCOUNTER — Encounter: Payer: Self-pay | Admitting: Family Medicine

## 2016-10-06 VITALS — BP 122/72 | HR 61 | Temp 97.8°F | Resp 16 | Ht 61.0 in | Wt 189.0 lb

## 2016-10-06 DIAGNOSIS — I6930 Unspecified sequelae of cerebral infarction: Secondary | ICD-10-CM

## 2016-10-06 DIAGNOSIS — R531 Weakness: Secondary | ICD-10-CM | POA: Diagnosis not present

## 2016-10-06 DIAGNOSIS — Z1231 Encounter for screening mammogram for malignant neoplasm of breast: Secondary | ICD-10-CM | POA: Diagnosis not present

## 2016-10-06 DIAGNOSIS — Z1239 Encounter for other screening for malignant neoplasm of breast: Secondary | ICD-10-CM

## 2016-10-06 DIAGNOSIS — G459 Transient cerebral ischemic attack, unspecified: Secondary | ICD-10-CM

## 2016-10-06 DIAGNOSIS — L84 Corns and callosities: Secondary | ICD-10-CM | POA: Diagnosis not present

## 2016-10-06 DIAGNOSIS — I872 Venous insufficiency (chronic) (peripheral): Secondary | ICD-10-CM | POA: Diagnosis not present

## 2016-10-06 DIAGNOSIS — E1140 Type 2 diabetes mellitus with diabetic neuropathy, unspecified: Secondary | ICD-10-CM

## 2016-10-06 DIAGNOSIS — I2510 Atherosclerotic heart disease of native coronary artery without angina pectoris: Secondary | ICD-10-CM | POA: Diagnosis not present

## 2016-10-06 DIAGNOSIS — F3341 Major depressive disorder, recurrent, in partial remission: Secondary | ICD-10-CM | POA: Diagnosis not present

## 2016-10-06 DIAGNOSIS — J449 Chronic obstructive pulmonary disease, unspecified: Secondary | ICD-10-CM | POA: Diagnosis not present

## 2016-10-06 DIAGNOSIS — I69354 Hemiplegia and hemiparesis following cerebral infarction affecting left non-dominant side: Secondary | ICD-10-CM | POA: Diagnosis not present

## 2016-10-06 DIAGNOSIS — E119 Type 2 diabetes mellitus without complications: Secondary | ICD-10-CM | POA: Diagnosis not present

## 2016-10-06 DIAGNOSIS — I5032 Chronic diastolic (congestive) heart failure: Secondary | ICD-10-CM | POA: Diagnosis not present

## 2016-10-06 DIAGNOSIS — I110 Hypertensive heart disease with heart failure: Secondary | ICD-10-CM | POA: Diagnosis not present

## 2016-10-06 NOTE — Assessment & Plan Note (Signed)
Significant improvement clinically, despite not entirely improved PHQ, scores shifted, less depression and mood symptoms, now other sequela seems more related to hospitalization - Admits insomnia, not interested in new med - Seems improved now more independent living on own in apartment, has good support system  Plan: 1. Continue Duloxetine 60mg  daily for now no changes 2. Follow-up as needed

## 2016-10-06 NOTE — Assessment & Plan Note (Signed)
Secondary to prior CVA, seems notably worsened now after recent episode, despite no confirmed CVA on imaging in hospital - Continues on Eliquis - Start [facility] PT with [facility], to contact patient today, we called them to check orders placed in hospital

## 2016-10-06 NOTE — Telephone Encounter (Signed)
Verbal given 

## 2016-10-06 NOTE — Progress Notes (Signed)
Subjective:    Patient ID: Samantha Aguirre, female    DOB: 1945-11-19, 71 y.o.   MRN: [id number]  Samantha Aguirre is a 71 y.o. female presenting on 10/06/2016 for Hospitalization Follow-up (TIA)   HPI  HOSPITAL FOLLOW-UP VISIT  Hospital/Location: [facility] Date of Admission: 10/03/16 Date of Discharge: 10/04/16 Transitions of care telephone call: Not completed.  Reason for Admission: Facial droop, numbness and tingling on face lips and L side Primary (+Secondary) Diagnosis: TIA with L sided weakness (imaging ruled out stroke), history of prior CVA, AFib  - Hospital H&P and Discharge Summary have been reviewed - Patient presents today 2 days after recent hospitalization. Brief summary of recent course, patient had symptoms of Left sided facial droop and numbness with tingling involving lip and left arm and leg weakness, she called our office initially and was advised to go directly to hospital ED for concern of new stroke with complex history of prior CVA, hospitalized for CVA protocol, treated with advanced imaging including CT and MRI both negative, without any finding of acute CVA. She was discharged with new [facility], has not started yet, and no change to medicines, continued on Eliquis. - Today reports overall has better after discharge, but not back to baseline. Symptoms of Left facial droop and numbness have resolved, within few hours of onset during hospital stay. Also reported that her Left arm weakness was improved by 8 hours of onset. Now today still has maybe 50% residual weakness of Left arm difficulty lifting above shoulder, and left lower leg is still significantly weak with only about 25% of baseline. - She is using cane to ambulate, able to stand but still needs increased assistance, looking forward to [facility] therapy again - New medications on discharge: None - Changes to current meds on discharge: None  Additional concerns today not related to hospitalization - Podiatry:  Bilateral great toes, R>L has plantar thickening and callus that is painful, requesting repeat referral to [facility] Podiat[facility] - Regarding Type 2 Diabetes, asking update A1c, previously in 7 range, last done in hospital 10/04/16 at 6.0, she is pleased with this result, she thought may be more elevated. Still taking Glipizide-Metformin 5-500mg  takes 2 in AM and 1 PM, denies any hypoglycemia, tries to eat low sugar diet, not watching carbs - Due for DM eye exam, request referral - Diastolic CHF, history of LE edema and venous insufficiency - still taking Lasix half tab 10mg  daily, doing well, still swelling, recent ECHO in hospital was negative, normal LVEF 60-65%  Health Maintenance: - Received flu shot in hospital - Due for repeat mammogram screening, last done >1 year ago in [employer] at [facility], now needs new order and to go to [facility], will sign records release today  Social Hx - Has own apartment locally, independent and feels better mood, has very helpful neighbor across   I have reviewed the discharge medication list, and have reconciled the current and discharge medications today.   Current Outpatient Prescriptions:  .  acetaminophen (TYLENOL) 325 MG tablet, Take 650 mg by mouth 2 (two) times daily. , Disp: , Rfl:  .  albuterol (PROVENTIL HFA;VENTOLIN HFA) 108 (90 Base) MCG/ACT inhaler, Inhale 2 puffs into the lungs every 6 (six) hours as needed for wheezing or shortness of breath., Disp: 1 Inhaler, Rfl: 2 .  apixaban (ELIQUIS) 5 MG TABS tablet, Take 5 mg by mouth every 12 (twelve) hours., Disp: , Rfl:  .  atorvastatin (LIPITOR) 80 MG tablet, Take 1 tablet (  80 mg total) by mouth daily at 6 PM., Disp: 90 tablet, Rfl: 3 .  baclofen (LIORESAL) 10 MG tablet, Take 0.5-1 tablets (5-10 mg total) by mouth 3 (three) times daily as needed for muscle spasms., Disp: 270 tablet, Rfl: 1 .  diclofenac sodium (VOLTAREN) 1 % GEL, Apply 2 g topically 4 (four) times daily., Disp: 1 Tube, Rfl:  1 .  DULoxetine (CYMBALTA) 60 MG capsule, Take 1 capsule (60 mg total) by mouth at bedtime., Disp: 30 capsule, Rfl: 11 .  fluticasone (FLONASE) 50 MCG/ACT nasal spray, Place 1 spray into both nostrils daily., Disp: 16 g, Rfl: 2 .  fluticasone furoate-vilanterol (BREO ELLIPTA) 100-25 MCG/INH AEPB, Inhale 1 puff into the lungs daily. (Patient taking differently: Inhale 1 puff into the lungs daily as needed. For shortness of breath), Disp: 1 each, Rfl: 11 .  furosemide (LASIX) 20 MG tablet, Take 10 mg by mouth daily. , Disp: 30 tablet, Rfl: 3 .  gabapentin (NEURONTIN) 800 MG tablet, Take 1 tablet (800 mg total) by mouth 2 (two) times daily., Disp: 180 tablet, Rfl: 1 .  glipiZIDE-metformin (METAGLIP) 5-500 MG tablet, Take 1-2 tablets by mouth 2 (two) times daily. 2 tablets every morning and 1 tablet at bedtime, Disp: , Rfl:  .  levETIRAcetam (KEPPRA) 500 MG tablet, Take 1 tablet (500 mg total) by mouth 2 (two) times daily., Disp: 60 tablet, Rfl: 0 .  levothyroxine (SYNTHROID, LEVOTHROID) 175 MCG tablet, Take 1 tablet (175 mcg total) by mouth daily before breakfast., Disp: 90 tablet, Rfl: 3 .  lisinopril (PRINIVIL,ZESTRIL) 20 MG tablet, Take 1 tablet (20 mg total) by mouth daily., Disp: 30 tablet, Rfl: 11 .  nitroGLYCERIN (NITROSTAT) 0.4 MG SL tablet, Place 1 tablet (0.4 mg total) under the tongue every 5 (five) minutes as needed for chest pain., Disp: 30 tablet, Rfl: 0 .  potassium chloride (MICRO-K) 10 MEQ CR capsule, Take 1 capsule (10 mEq total) by mouth daily., Disp: 90 capsule, Rfl: 3 .  umeclidinium bromide (INCRUSE ELLIPTA) 62.5 MCG/INH AEPB, Inhale 1 puff into the lungs daily., Disp: 1 each, Rfl: 11   Depression screen [facility] 2/9 10/06/2016 11/13/2015  Decreased Interest 1 3  Down, Depressed, Hopeless 1 1  PHQ - 2 Score 2 4  Altered sleeping 3 2  Tired, decreased energy 2 3  Change in appetite 2 1  Feeling bad or failure about yourself  1 3  Trouble concentrating 3 1  Moving slowly or  fidgety/restless 1 0  Suicidal thoughts 0 1  PHQ-9 Score 14 15  Difficult doing work/chores Somewhat difficult Somewhat difficult   ------------------------------------------------------------------------- Social History  Substance Use Topics  . Smoking status: Current Every Day Smoker    Packs/day: 0.50    Years: 50.00    Types: Cigarettes  . Smokeless tobacco: Current User  . Alcohol use No    Review of Systems Per HPI unless specifically indicated above     Objective:    BP 122/72   Pulse 61   Temp 97.8 F (36.6 C) (Oral)   Resp 16   Ht 5\' 1"  (1.549 m)   Wt 189 lb (85.7 kg)   BMI 35.71 kg/m   Wt Readings from Last 3 Encounters:  10/06/16 189 lb (85.7 kg)  10/03/16 183 lb 14.4 oz (83.4 kg)  04/18/16 182 lb 15.7 oz (83 kg)    Physical Exam  Constitutional: She is oriented to person, place, and time. She appears well-developed and well-nourished. No distress.  Chronically ill appearing  elderly 71 yr female but currently well, comfortable, cooperative  Using cane with 4 point base  HENT:  Head: Normocephalic and atraumatic.  Mouth/Throat: Oropharynx is clear and moist.  Resolved facial ecchymosis  Eyes: Pupils are equal, round, and reactive to light. EOM are normal.  Neck: Normal range of motion. Neck supple.  Cardiovascular: Normal rate, normal heart sounds and intact distal pulses.   No murmur heard. Irregularly irregular  Pulmonary/Chest: Effort normal and breath sounds normal. No respiratory distress. She has no wheezes. She has no rales.  Speaks full sentences. Good air movement  Musculoskeletal: She exhibits no edema.  Upper extremities Normal tone, moves symmetrical Slightly worse compared to pre-hospitalization but improving now LUE grip and extremity strength 3 to 4 out of 5, R grip is 5/5 and RUE 5/5  Left lower extremity hip and knee ankle muscle strength 3/5 limited also with L knee pain  RLE strength is intact 5/5  Able to stand from seated  position  Lymphadenopathy:    She has no cervical adenopathy.  Neurological: She is alert and oriented to person, place, and time. No cranial nerve deficit.  Distal sensation intact to light touch bilateral upper and lower extremities, into hand fingers all intact.  Skin: Skin is warm and dry. No rash noted. She is not diaphoretic. No erythema.  Psychiatric: She has a normal mood and affect. Her behavior is normal.  Well groomed, good eye contact, normal speech and thoughts. Improved positive mood. Good insight into health  Nursing note and vitals reviewed.   Diabetic Foot Exam - Simple   Simple Foot Form Diabetic Foot exam was performed with the following findings:  Yes 10/06/2016 11:40 AM  Visual Inspection See comments:  Yes Sensation Testing See comments:  Yes Pulse Check Posterior Tibialis and Dorsalis pulse intact bilaterally:  Yes Comments Bilateral reduced monofilament testing, mostly intact medial aspect great toes and mid foot bilateral. Reduced L > R dorsal and hindfoot towards heal and lateral aspect. Also bilateral feet R>L with large thick callus/corn formation under both great toes, tender to touch.     Results for orders placed or performed during the hospital encounter of 99/83/38  Basic metabolic panel  Result Value Ref Range   Sodium 142 135 - 145 mmol/L   Potassium 3.8 3.5 - 5.1 mmol/L   Chloride 107 101 - 111 mmol/L   CO2 25 22 - 32 mmol/L   Glucose, Bld 141 (H) 65 - 99 mg/dL   BUN 12 6 - 20 mg/dL   Creatinine, Ser 0.69 0.44 - 1.00 mg/dL   Calcium 9.0 8.9 - 10.3 mg/dL   GFR calc non Af Amer >60 >60 mL/min   GFR calc Af Amer >60 >60 mL/min   Anion gap 10 5 - 15  CBC  Result Value Ref Range   WBC 7.3 3.6 - 11.0 K/uL   RBC 4.03 3.80 - 5.20 MIL/uL   Hemoglobin 12.0 12.0 - 16.0 g/dL   HCT 35.0 35.0 - 47.0 %   MCV 86.9 80.0 - 100.0 fL   MCH 29.8 26.0 - 34.0 pg   MCHC 34.3 32.0 - 36.0 g/dL   RDW 15.2 (H) 11.5 - 14.5 %   Platelets 287 150 - 440 K/uL    Urinalysis, Complete w Microscopic  Result Value Ref Range   Color, Urine YELLOW (A) YELLOW   APPearance CLEAR (A) CLEAR   Specific Gravity, Urine 1.020 1.005 - 1.030   pH 6.0 5.0 - 8.0   Glucose,  UA NEGATIVE NEGATIVE mg/dL   Hgb urine dipstick NEGATIVE NEGATIVE   Bilirubin Urine NEGATIVE NEGATIVE   Ketones, ur NEGATIVE NEGATIVE mg/dL   Protein, ur NEGATIVE NEGATIVE mg/dL   Nitrite NEGATIVE NEGATIVE   Leukocytes, UA NEGATIVE NEGATIVE   RBC / HPF 0-5 0 - 5 RBC/hpf   WBC, UA NONE SEEN 0 - 5 WBC/hpf   Bacteria, UA NONE SEEN NONE SEEN   Squamous Epithelial / LPF 0-5 (A) NONE SEEN   Mucus PRESENT   Troponin I  Result Value Ref Range   Troponin I <0.03 <0.03 ng/mL  Brain natriuretic peptide  Result Value Ref Range   B Natriuretic Peptide 36.0 0.0 - 100.0 pg/mL  Protime-INR  Result Value Ref Range   Prothrombin Time 13.6 11.4 - 15.2 seconds   INR 1.05   Hemoglobin A1c  Result Value Ref Range   Hgb A1c MFr Bld 6.0 (H) 4.8 - 5.6 %   Mean Plasma Glucose 125.5 mg/dL  Lipid panel  Result Value Ref Range   Cholesterol 159 0 - 200 mg/dL   Triglycerides 120 <150 mg/dL   HDL 41 >40 mg/dL   Total CHOL/HDL Ratio 3.9 RATIO   VLDL 24 0 - 40 mg/dL   LDL Cholesterol 94 0 - 99 mg/dL  ECHOCARDIOGRAM COMPLETE  Result Value Ref Range   Weight 2,942.4 oz   Height 61 in   BP 146/59 mmHg      Assessment & Plan:   Problem List Items Addressed This Visit    Chronic venous insufficiency    Stable chronic problem likely causing significant portion of her ankle and LE edema Recent ECHO normal LVEF 60-65% no other abnormality, normal Cr  Plan: 1. Reviewed RICE therapy and continue Lasix 10mg  daily (half of 20mg  tab)      Controlled type 2 diabetes mellitus with diabetic neuropathy ([place])    Well-controlled DM with A1c 6.0 (improved from 7s) No known complications or hypoglycemia.  Plan:  1. Continue current therapy - Glipizide-Metformin 5-500mg  x 2 in AM and x 1 in PM - counseling on may  need reduced dose especially Sulfonylurea, she declines change right now wants to stay on same meds. Denies hypoglycemia 2. Encourage improved lifestyle - low carb, continue improving exercise in future when improves strength PT 3. Check CBG, bring log to next visit for review 4. Continue ACEi, Statin 5. DM Foot exam done today / Advised to schedule DM ophtho exam, send record - referral to [facility] 6. Follow-up 3-6 months A1c      Relevant Orders   Ambulatory referral to Podiatry   Ambulatory referral to Optometry   History of cerebrovascular accident (CVA) with residual deficit    Presumed new TIA with still lasting deficits, concern since no CVA identified on imaging in hospital on MRI - Chronic AFib likely contributing, high risk patient with prior CVAs - Remains on Anticoag with Eliquis - Followed by Cardiology  Plan: 1. Continue with Cards/ [facility]rology - [facility] 2. Continue Eliquis 3. Remain on Statin, ACEi 4. Proceed with HH PT [facility], confirmed today, work on improving L sided hemiparesis 5. Follow-up as needed - reviewed return criteria when to go directly to hospital if acute worsening      Left-sided weakness    Secondary to prior CVA, seems notably worsened now after recent episode, despite no confirmed CVA on imaging in hospital - Continues on Eliquis - Start [facility] PT with [facility], to contact patient today, we called them to check  orders placed in hospital      Recurrent major depressive disorder, in partial remission ([place])    Significant improvement clinically, despite not entirely improved PHQ, scores shifted, less depression and mood symptoms, now other sequela seems more related to hospitalization - Admits insomnia, not interested in new med - Seems improved now more independent living on own in apartment, has good support system  Plan: 1. Continue Duloxetine 60mg  daily for now no changes 2. Follow-up as needed       Other Visit Diagnoses     Transient cerebral ischemia, unspecified type    -  Primary   Foot callus       Referral to [facility] Podiatry   Relevant Orders   Ambulatory referral to Podiatry   Screening for breast cancer       Relevant Orders   MM DIGITAL SCREENING BILATERAL       No orders of the defined types were placed in this encounter.   Follow up plan: Return in about 3 months (around 01/05/2017) for Diabetes, f/u history CVA.  Nobie Putnam, [facility] 10/06/2016, 1:32 PM

## 2016-10-06 NOTE — Telephone Encounter (Signed)
Ben with [facility] needs a verbal for PT and OT evaluation for twice a week for 8 weeks [phone]

## 2016-10-06 NOTE — Assessment & Plan Note (Signed)
Stable chronic problem likely causing significant portion of her ankle and LE edema Recent ECHO normal LVEF 60-65% no other abnormality, normal Cr  Plan: 1. Reviewed RICE therapy and continue Lasix 10mg  daily (half of 20mg  tab)

## 2016-10-06 NOTE — Assessment & Plan Note (Signed)
Well-controlled DM with A1c 6.0 (improved from 7s) No known complications or hypoglycemia.  Plan:  1. Continue current therapy - Glipizide-Metformin 5-500mg  x 2 in AM and x 1 in PM - counseling on may need reduced dose especially Sulfonylurea, she declines change right now wants to stay on same meds. Denies hypoglycemia 2. Encourage improved lifestyle - low carb, continue improving exercise in future when improves strength PT 3. Check CBG, bring log to next visit for review 4. Continue ACEi, Statin 5. DM Foot exam done today / Advised to schedule DM ophtho exam, send record - referral to [facility] 6. Follow-up 3-6 months A1c

## 2016-10-06 NOTE — Patient Instructions (Addendum)
Thank you for coming to the clinic today.  1.  We will call [facility] to check status, will notify you if need to change anything ----  Referral to Dr Ellin Mayhew in [facility]  ------------------  [facility]  [place], [place] [address] Hours - M-F 8-5 Phone: [phone] phone --------------------  2. No change to medications  3. For Mammogram screening for breast cancer   Call the [facility] below anytime to schedule your own appointment now that order has been placed.  [facility] [place], [place] [address] Phone: [phone]  Please schedule a Follow-up Appointment to: Return in about 3 months (around 01/05/2017) for Diabetes, f/u history CVA.  If you have any other questions or concerns, please feel free to call the clinic or send a message through [facility]. You may also schedule an earlier appointment if necessary.  Additionally, you may be receiving a survey about your experience at our clinic within a few days to 1 week by e-mail or mail. We value your feedback.  Nobie Putnam, DO [place]

## 2016-10-06 NOTE — Assessment & Plan Note (Addendum)
Presumed new TIA with still lasting deficits, concern since no CVA identified on imaging in hospital on MRI - Chronic AFib likely contributing, high risk patient with prior CVAs - Remains on Anticoag with Eliquis - Followed by Cardiology  Plan: 1. Continue with Cards/ Neurology - [facility] 2. Continue Eliquis 3. Remain on Statin, ACEi 4. Proceed with HH PT [facility], confirmed today, work on improving L sided hemiparesis 5. Follow-up as needed - reviewed return criteria when to go directly to hospital if acute worsening

## 2016-10-07 NOTE — Telephone Encounter (Signed)
error 

## 2016-10-08 ENCOUNTER — Telehealth: Payer: Self-pay | Admitting: Family Medicine

## 2016-10-08 ENCOUNTER — Other Ambulatory Visit: Payer: Self-pay

## 2016-10-08 DIAGNOSIS — Z889 Allergy status to unspecified drugs, medicaments and biological substances status: Secondary | ICD-10-CM

## 2016-10-08 DIAGNOSIS — J449 Chronic obstructive pulmonary disease, unspecified: Secondary | ICD-10-CM

## 2016-10-08 DIAGNOSIS — I2510 Atherosclerotic heart disease of native coronary artery without angina pectoris: Secondary | ICD-10-CM | POA: Diagnosis not present

## 2016-10-08 DIAGNOSIS — I69354 Hemiplegia and hemiparesis following cerebral infarction affecting left non-dominant side: Secondary | ICD-10-CM | POA: Diagnosis not present

## 2016-10-08 DIAGNOSIS — E1140 Type 2 diabetes mellitus with diabetic neuropathy, unspecified: Secondary | ICD-10-CM

## 2016-10-08 DIAGNOSIS — I110 Hypertensive heart disease with heart failure: Secondary | ICD-10-CM | POA: Diagnosis not present

## 2016-10-08 DIAGNOSIS — I10 Essential (primary) hypertension: Secondary | ICD-10-CM

## 2016-10-08 DIAGNOSIS — I5032 Chronic diastolic (congestive) heart failure: Secondary | ICD-10-CM | POA: Diagnosis not present

## 2016-10-08 DIAGNOSIS — E119 Type 2 diabetes mellitus without complications: Secondary | ICD-10-CM | POA: Diagnosis not present

## 2016-10-08 MED ORDER — ALBUTEROL SULFATE HFA 108 (90 BASE) MCG/ACT IN AERS: 2.0000 | INHALATION_SPRAY | Freq: Four times a day (QID) | RESPIRATORY_TRACT | 1 refills | Status: AC | PRN

## 2016-10-08 MED ORDER — FLUTICASONE PROPIONATE 50 MCG/ACT NA SUSP: 1.0000 | Freq: Every day | NASAL | 3 refills | Status: DC

## 2016-10-08 MED ORDER — LISINOPRIL 20 MG PO TABS: 20.0000 mg | ORAL_TABLET | Freq: Every day | ORAL | 3 refills | Status: DC

## 2016-10-08 NOTE — Telephone Encounter (Signed)
Samantha Aguirre with [facility] mail order pharmacy said pt is changing to mail order.  Please call [phone] option 3

## 2016-10-08 NOTE — Telephone Encounter (Signed)
Called pharmacy needed 90 days Rx was send.

## 2016-10-08 NOTE — Telephone Encounter (Signed)
Will with [facility] needs a verbal for OT twice a week for 4 weeks [phone]

## 2016-10-10 ENCOUNTER — Inpatient Hospital Stay
Admission: RE | Admit: 2016-10-10 | Discharge: 2016-10-10 | Disposition: A | Discharge status: Home / Self Care | Payer: Self-pay | Source: Ambulatory Visit | Attending: *Deleted | Admitting: *Deleted

## 2016-10-10 ENCOUNTER — Other Ambulatory Visit: Payer: Self-pay | Admitting: *Deleted

## 2016-10-10 DIAGNOSIS — I69354 Hemiplegia and hemiparesis following cerebral infarction affecting left non-dominant side: Secondary | ICD-10-CM | POA: Diagnosis not present

## 2016-10-10 DIAGNOSIS — Z9289 Personal history of other medical treatment: Secondary | ICD-10-CM

## 2016-10-10 DIAGNOSIS — I2510 Atherosclerotic heart disease of native coronary artery without angina pectoris: Secondary | ICD-10-CM | POA: Diagnosis not present

## 2016-10-10 DIAGNOSIS — I5032 Chronic diastolic (congestive) heart failure: Secondary | ICD-10-CM | POA: Diagnosis not present

## 2016-10-10 DIAGNOSIS — J449 Chronic obstructive pulmonary disease, unspecified: Secondary | ICD-10-CM | POA: Diagnosis not present

## 2016-10-10 DIAGNOSIS — E119 Type 2 diabetes mellitus without complications: Secondary | ICD-10-CM | POA: Diagnosis not present

## 2016-10-10 DIAGNOSIS — I110 Hypertensive heart disease with heart failure: Secondary | ICD-10-CM | POA: Diagnosis not present

## 2016-10-16 ENCOUNTER — Telehealth: Payer: Self-pay | Admitting: Family Medicine

## 2016-10-16 DIAGNOSIS — K219 Gastro-esophageal reflux disease without esophagitis: Secondary | ICD-10-CM

## 2016-10-16 MED ORDER — OMEPRAZOLE 20 MG PO CPDR: 20.0000 mg | DELAYED_RELEASE_CAPSULE | Freq: Every day | ORAL | 3 refills | Status: DC

## 2016-10-16 NOTE — Telephone Encounter (Signed)
Sent omeprazole 20mg  daily  Nobie Putnam, DO [facility] 10/16/2016, 2:38 PM

## 2016-10-16 NOTE — Telephone Encounter (Signed)
Pt. Called requesting that you  Call something  In for   Her  Acid reflux. Pt. Call back # [phone]

## 2016-10-20 ENCOUNTER — Emergency Department: Payer: Medicare Other

## 2016-10-20 ENCOUNTER — Emergency Department
Admission: EM | Admit: 2016-10-20 | Discharge: 2016-10-20 | Disposition: A | Discharge status: Home / Self Care | Payer: Medicare Other | Attending: Emergency Medicine | Admitting: Emergency Medicine

## 2016-10-20 DIAGNOSIS — F17210 Nicotine dependence, cigarettes, uncomplicated: Secondary | ICD-10-CM | POA: Insufficient documentation

## 2016-10-20 DIAGNOSIS — E119 Type 2 diabetes mellitus without complications: Secondary | ICD-10-CM | POA: Insufficient documentation

## 2016-10-20 DIAGNOSIS — I4891 Unspecified atrial fibrillation: Secondary | ICD-10-CM | POA: Diagnosis not present

## 2016-10-20 DIAGNOSIS — I5032 Chronic diastolic (congestive) heart failure: Secondary | ICD-10-CM | POA: Insufficient documentation

## 2016-10-20 DIAGNOSIS — R202 Paresthesia of skin: Secondary | ICD-10-CM | POA: Diagnosis not present

## 2016-10-20 DIAGNOSIS — R079 Chest pain, unspecified: Secondary | ICD-10-CM

## 2016-10-20 DIAGNOSIS — J449 Chronic obstructive pulmonary disease, unspecified: Secondary | ICD-10-CM | POA: Insufficient documentation

## 2016-10-20 DIAGNOSIS — I252 Old myocardial infarction: Secondary | ICD-10-CM | POA: Diagnosis not present

## 2016-10-20 DIAGNOSIS — E7800 Pure hypercholesterolemia, unspecified: Secondary | ICD-10-CM | POA: Insufficient documentation

## 2016-10-20 DIAGNOSIS — I2510 Atherosclerotic heart disease of native coronary artery without angina pectoris: Secondary | ICD-10-CM | POA: Insufficient documentation

## 2016-10-20 DIAGNOSIS — I110 Hypertensive heart disease with heart failure: Secondary | ICD-10-CM | POA: Diagnosis not present

## 2016-10-20 DIAGNOSIS — R200 Anesthesia of skin: Secondary | ICD-10-CM | POA: Diagnosis not present

## 2016-10-20 LAB — BASIC METABOLIC PANEL
ANION GAP: 10 (ref 5–15)
BUN: 14 mg/dL (ref 6–20)
CHLORIDE: 104 mmol/L (ref 101–111)
CO2: 25 mmol/L (ref 22–32)
Calcium: 8.8 mg/dL — ABNORMAL LOW (ref 8.9–10.3)
Creatinine, Ser: 0.65 mg/dL (ref 0.44–1.00)
Glucose, Bld: 121 mg/dL — ABNORMAL HIGH (ref 65–99)
POTASSIUM: 3.8 mmol/L (ref 3.5–5.1)
SODIUM: 139 mmol/L (ref 135–145)

## 2016-10-20 LAB — CBC
HEMATOCRIT: 33.7 % — AB (ref 35.0–47.0)
HEMOGLOBIN: 11.5 g/dL — AB (ref 12.0–16.0)
MCH: 29.2 pg (ref 26.0–34.0)
MCHC: 34.1 g/dL (ref 32.0–36.0)
MCV: 85.6 fL (ref 80.0–100.0)
Platelets: 307 10*3/uL (ref 150–440)
RBC: 3.94 MIL/uL (ref 3.80–5.20)
RDW: 14.6 % — ABNORMAL HIGH (ref 11.5–14.5)
WBC: 8 10*3/uL (ref 3.6–11.0)

## 2016-10-20 LAB — GLUCOSE, CAPILLARY: GLUCOSE-CAPILLARY: 125 mg/dL — AB (ref 65–99)

## 2016-10-20 LAB — TROPONIN I: Troponin I: <0.03 ng/mL (ref <0.03)

## 2016-10-20 MED ORDER — TRAMADOL HCL 50 MG PO TABS: 50.0000 mg | ORAL_TABLET | Freq: Four times a day (QID) | ORAL | 0 refills | Status: DC | PRN

## 2016-10-20 MED ORDER — GI COCKTAIL ~~LOC~~
30.0000 mL | Freq: Once | ORAL | Status: AC
  Administered 2016-10-20: 30 mL via ORAL
  Filled 2016-10-20: qty 30

## 2016-10-20 MED ORDER — OMEPRAZOLE 20 MG PO CPDR: 20.0000 mg | DELAYED_RELEASE_CAPSULE | Freq: Two times a day (BID) | ORAL | 1 refills | Status: DC

## 2016-10-20 MED ORDER — TRAMADOL HCL 50 MG PO TABS
50.0000 mg | ORAL_TABLET | Freq: Once | ORAL | Status: AC
  Administered 2016-10-20: 50 mg via ORAL
  Filled 2016-10-20: qty 1

## 2016-10-20 MED ORDER — SUCRALFATE 1 G PO TABS: 1.0000 g | ORAL_TABLET | Freq: Four times a day (QID) | ORAL | 1 refills | Status: DC

## 2016-10-20 NOTE — ED Triage Notes (Signed)
Patient reports chest pain mid chest radiating to left side, also reports numbness on face left side,

## 2016-10-20 NOTE — ED Provider Notes (Signed)
[facility] Emergency Department Provider Note       Time seen: ----------------------------------------- 12:53 PM on 10/20/2016 -----------------------------------------     I have reviewed the triage vital signs and the nursing notes.   HISTORY   Chief Complaint Chest Pain (reports stroke like symptoms on left cheek, stroke 3 wks ago)    HPI Samantha Aguirre is a 71 y.o. female with a history of strokes as well as heart disease and CHF who presents to the ED for chest pain that is sharp and stabbing in the mid chest. Patient describes as 8 out of 10, nothing makes it better or worse. She's also had some headache and some left-sided numbness which has persisted since her last stroke. She reportedly has a stroke affecting her left side 3 weeks ago but states the neurologic symptoms are unchanged.  Past Medical History:  Diagnosis Date  . Acid reflux   . Arthritis    Bilateral knees, hands, feet  . Asthma   . Atrial fibrillation ([place])   . Cancer ([place])   . Chest pain   . CHF (congestive heart failure) (HCC)    Diastolic CHF  . COPD (chronic obstructive pulmonary disease) ([place])   . Coronary artery disease   . Diabetes mellitus without complication ([place])   . Frequent headaches   . Heart attack ([place])   . High cholesterol   . HLD (hyperlipidemia)   . Hypertension   . MI (myocardial infarction) ([place])   . Seizures ([place])   . Skin cancer 2015   Suspected basal cell carcinoma on nose, surgically removed  . Stroke ([place])   . Thyroid disease   . Tremors of nervous system     Patient Active Problem List   Diagnosis Date Noted  . Chronic venous insufficiency 10/06/2016  . Recurrent major depressive disorder, in partial remission ([place]) 10/06/2016  . Left-sided weakness 10/03/2016  . Chest pain 02/27/2016  . History of cerebrovascular accident (CVA) with residual deficit 02/19/2016  . Thumb pain, left 01/22/2016  . Traumatic closed nondisplaced  fracture of base of metacarpal bone of left thumb 01/22/2016  . Colon cancer screening 11/27/2015  . Depression 11/14/2015  . Coronary artery disease 11/13/2015  . Hypertension 11/13/2015  . History of skin cancer 11/13/2015  . Osteoporosis 11/13/2015  . Chronic low back pain 11/13/2015  . Left medial knee pain 11/13/2015  . Osteoarthritis of multiple joints 11/13/2015  . Other specified hypothyroidism 08/16/2015  . Controlled type 2 diabetes mellitus with diabetic neuropathy ([place]) 08/16/2015  . COPD (chronic obstructive pulmonary disease) ([place]) 08/16/2015  . Tobacco abuse 08/16/2015  . HLD (hyperlipidemia) 08/16/2015  . History of CHF (congestive heart failure) 08/16/2015  . Seizures ([place])     Past Surgical History:  Procedure Laterality Date  . ABDOMINAL HYSTERECTOMY  1976  . APPENDECTOMY  1980  . CAROTID ENDARTERECTOMY    . CHOLECYSTECTOMY  1980  . [employer]  . KNEE SURGERY     left  . ROTATOR CUFF REPAIR     right  . TONSILLECTOMY  1950    Allergies Mushroom extract complex; Atenolol; Ivp dye [iodinated diagnostic agents]; Morphine and related; Percocet [oxycodone-acetaminophen]; Percodan [oxycodone-aspirin]; and Verapamil  Social History Social History  Substance Use Topics  . Smoking status: Current Every Day Smoker    Packs/day: 0.50    Years: 50.00    Types: Cigarettes  . Smokeless tobacco: Current User  . Alcohol use No    Review of Systems Constitutional:  Negative for fever. Eyes: Negative for vision changes ENT:  Negative for congestion, sore throat Cardiovascular: positive for chest pain Respiratory: Negative for shortness of breath. Gastrointestinal: Negative for abdominal pain, vomiting and diarrhea. Genitourinary: Negative for dysuria. Musculoskeletal: Negative for back pain. Skin: Negative for rash. Neurological: positive for left-sided weakness and numbness  All systems negative/normal/unremarkable except as stated in the  HPI  ____________________________________________   PHYSICAL EXAM:  VITAL SIGNS: ED Triage Vitals  Enc Vitals Group     BP --      Pulse --      Resp --      Temp --      Temp src --      SpO2 10/20/16 1233 98 %     Weight 10/20/16 1239 189 lb (85.7 kg)     Height 10/20/16 1239 5\' 1"  (1.549 m)     Head Circumference --      Peak Flow --      Pain Score 10/20/16 1236 8     Pain Loc --      Pain Edu? --      Excl. in [place]? --     Constitutional: Alert and oriented. Well appearing and in no distress. Eyes: Conjunctivae are normal. Normal extraocular movements. ENT   Head: Normocephalic and atraumatic.   Nose: No congestion/rhinnorhea.   Mouth/Throat: Mucous membranes are moist.   Neck: No stridor. Cardiovascular: Normal rate, regular rhythm. No murmurs, rubs, or gallops. Respiratory: Normal respiratory effort without tachypnea nor retractions. Breath sounds are clear and equal bilaterally. No wheezes/rales/rhonchi. Gastrointestinal: Soft and nontender. Normal bowel sounds Musculoskeletal: Nontender with normal range of motion in extremities. No lower extremity tenderness nor edema. Neurologic:  Normal speech and language. left-sided facial droop is noted affecting the left lower aspect of the face. Decreased sensation on the left side of her body compared to right. Mildly decreased strength in the left arm compared to right. Skin:  Skin is warm, dry and intact. No rash noted. Psychiatric: Mood and affect are normal. Speech and behavior are normal.  ____________________________________________  EKG: Interpreted by me.sinus rhythm rate of 74 bpm, normal PR interval, wide QRS, normal QT.  ____________________________________________  ED COURSE:  Pertinent labs & imaging results that were available during my care of the patient were reviewed by me and considered in my medical decision making (see chart for details). Patient presents for chest pain and persistent  neurologic dysfunction, we will assess with labs and imaging as indicated. Clinical Course as of Oct 21 1442  Mon Oct 20, 2016  1420 patient describes severe GERD symptoms over the past several days.  [JW]    Clinical Course User Index [JW] Earleen Newport, MD   Procedures ____________________________________________   LABS (pertinent positives/negatives)  Labs Reviewed  BASIC METABOLIC PANEL - Abnormal; Notable for the following:       Result Value   Glucose, Bld 121 (*)    Calcium 8.8 (*)    All other components within normal limits  CBC - Abnormal; Notable for the following:    Hemoglobin 11.5 (*)    HCT 33.7 (*)    RDW 14.6 (*)    All other components within normal limits  GLUCOSE, CAPILLARY - Abnormal; Notable for the following:    Glucose-Capillary 125 (*)    All other components within normal limits  TROPONIN I    RADIOLOGY  chest x-ray  IMPRESSION: 1. No acute intracranial abnormality. 2. Stable cerebral atrophy and chronic microvascular white  matter Disease. head CT IMPRESSION: No acute abnormalities.  Aortic atherosclerosis. ____________________________________________  DIFFERENTIAL DIAGNOSIS   MI, CVA, arrhythmia, musculoskeletal pain, GERD, pneumothorax, PE   FINAL ASSESSMENT AND PLAN  chest pain, paresthesias  Plan: Patient had presented for chest pain which appears to be more GI related than anything else. Patients labs reassuring here with no signs of elevated troponin or other abnormality. Patients imaging including CT of her head as well as chest x-ray were negative for acute processes. Overall she appears stable for outpatient follow-up with her doctor.   Earleen Newport, MD   Note: This note was generated in part or whole with voice recognition software. Voice recognition is usually quite accurate but there are transcription errors that can and very often do occur. I apologize for any typographical errors that were not  detected and corrected.     Earleen Newport, MD 10/20/16 [phone]

## 2016-10-27 DIAGNOSIS — L851 Acquired keratosis [keratoderma] palmaris et plantaris: Secondary | ICD-10-CM | POA: Diagnosis not present

## 2016-10-27 DIAGNOSIS — E1140 Type 2 diabetes mellitus with diabetic neuropathy, unspecified: Secondary | ICD-10-CM | POA: Diagnosis not present

## 2016-10-27 DIAGNOSIS — B351 Tinea unguium: Secondary | ICD-10-CM | POA: Diagnosis not present

## 2016-10-30 ENCOUNTER — Telehealth: Payer: Self-pay

## 2016-10-30 NOTE — Telephone Encounter (Signed)
Patient called reporting that her OT thinks she needs x ray.  She would like you to order them, Or does she need an appointment?

## 2016-10-30 NOTE — Telephone Encounter (Signed)
I spoke with patient and she was explaining that she has a lump in between her shoulder and elbow.  The PT will not do much work until she gets it checked out.  I made patient an appointment for 11/05/16 that is the earliest she could come.

## 2016-10-30 NOTE — Telephone Encounter (Signed)
That will work. Will follow-up with her at office visit on 10/24 and determine if x-ray or other evaluation is needed.  Nobie Putnam, DO [facility] 10/30/2016, 4:21 PM

## 2016-11-05 ENCOUNTER — Ambulatory Visit: Payer: Medicare Other | Admitting: Family Medicine

## 2016-11-13 ENCOUNTER — Telehealth: Payer: Self-pay | Admitting: Family Medicine

## 2016-11-13 DIAGNOSIS — I110 Hypertensive heart disease with heart failure: Secondary | ICD-10-CM | POA: Diagnosis not present

## 2016-11-13 DIAGNOSIS — E119 Type 2 diabetes mellitus without complications: Secondary | ICD-10-CM | POA: Diagnosis not present

## 2016-11-13 DIAGNOSIS — I2510 Atherosclerotic heart disease of native coronary artery without angina pectoris: Secondary | ICD-10-CM | POA: Diagnosis not present

## 2016-11-13 DIAGNOSIS — I5032 Chronic diastolic (congestive) heart failure: Secondary | ICD-10-CM | POA: Diagnosis not present

## 2016-11-13 DIAGNOSIS — I69354 Hemiplegia and hemiparesis following cerebral infarction affecting left non-dominant side: Secondary | ICD-10-CM | POA: Diagnosis not present

## 2016-11-13 DIAGNOSIS — G40909 Epilepsy, unspecified, not intractable, without status epilepticus: Secondary | ICD-10-CM | POA: Diagnosis not present

## 2016-11-13 NOTE — Telephone Encounter (Signed)
Ben with [facility] needs verbal for PT twice a week for 4 weeks [phone]

## 2016-11-13 NOTE — Telephone Encounter (Signed)
Will with [facility] needs a verbal for OT twice a week for 2 weeks then once a week for 2 weeks.  His call back number is [phone]

## 2016-11-14 NOTE — Telephone Encounter (Signed)
Verbal given 

## 2016-11-17 ENCOUNTER — Ambulatory Visit: Payer: Medicare Other | Admitting: Family Medicine

## 2016-11-17 DIAGNOSIS — I2510 Atherosclerotic heart disease of native coronary artery without angina pectoris: Secondary | ICD-10-CM | POA: Diagnosis not present

## 2016-11-17 DIAGNOSIS — I5032 Chronic diastolic (congestive) heart failure: Secondary | ICD-10-CM | POA: Diagnosis not present

## 2016-11-17 DIAGNOSIS — I69354 Hemiplegia and hemiparesis following cerebral infarction affecting left non-dominant side: Secondary | ICD-10-CM | POA: Diagnosis not present

## 2016-11-17 DIAGNOSIS — G40909 Epilepsy, unspecified, not intractable, without status epilepticus: Secondary | ICD-10-CM | POA: Diagnosis not present

## 2016-11-17 DIAGNOSIS — E119 Type 2 diabetes mellitus without complications: Secondary | ICD-10-CM | POA: Diagnosis not present

## 2016-11-17 DIAGNOSIS — I110 Hypertensive heart disease with heart failure: Secondary | ICD-10-CM | POA: Diagnosis not present

## 2016-11-19 DIAGNOSIS — I69354 Hemiplegia and hemiparesis following cerebral infarction affecting left non-dominant side: Secondary | ICD-10-CM | POA: Diagnosis not present

## 2016-11-19 DIAGNOSIS — I110 Hypertensive heart disease with heart failure: Secondary | ICD-10-CM | POA: Diagnosis not present

## 2016-11-19 DIAGNOSIS — I2510 Atherosclerotic heart disease of native coronary artery without angina pectoris: Secondary | ICD-10-CM | POA: Diagnosis not present

## 2016-11-19 DIAGNOSIS — I5032 Chronic diastolic (congestive) heart failure: Secondary | ICD-10-CM | POA: Diagnosis not present

## 2016-11-19 DIAGNOSIS — G40909 Epilepsy, unspecified, not intractable, without status epilepticus: Secondary | ICD-10-CM | POA: Diagnosis not present

## 2016-11-19 DIAGNOSIS — E119 Type 2 diabetes mellitus without complications: Secondary | ICD-10-CM | POA: Diagnosis not present

## 2016-11-19 LAB — HM DIABETES EYE EXAM

## 2016-11-20 DIAGNOSIS — I5032 Chronic diastolic (congestive) heart failure: Secondary | ICD-10-CM | POA: Diagnosis not present

## 2016-11-20 DIAGNOSIS — E119 Type 2 diabetes mellitus without complications: Secondary | ICD-10-CM | POA: Diagnosis not present

## 2016-11-20 DIAGNOSIS — G40909 Epilepsy, unspecified, not intractable, without status epilepticus: Secondary | ICD-10-CM | POA: Diagnosis not present

## 2016-11-20 DIAGNOSIS — I69354 Hemiplegia and hemiparesis following cerebral infarction affecting left non-dominant side: Secondary | ICD-10-CM | POA: Diagnosis not present

## 2016-11-20 DIAGNOSIS — I2510 Atherosclerotic heart disease of native coronary artery without angina pectoris: Secondary | ICD-10-CM | POA: Diagnosis not present

## 2016-11-20 DIAGNOSIS — I110 Hypertensive heart disease with heart failure: Secondary | ICD-10-CM | POA: Diagnosis not present

## 2016-11-24 ENCOUNTER — Encounter: Payer: Self-pay | Admitting: Family Medicine

## 2016-11-24 ENCOUNTER — Ambulatory Visit
Admission: RE | Admit: 2016-11-24 | Discharge: 2016-11-24 | Disposition: A | Discharge status: Home / Self Care | Payer: Medicare Other | Source: Ambulatory Visit | Attending: Family Medicine | Admitting: Family Medicine

## 2016-11-24 ENCOUNTER — Ambulatory Visit (INDEPENDENT_AMBULATORY_CARE_PROVIDER_SITE_OTHER): Payer: Medicare Other | Admitting: Family Medicine

## 2016-11-24 VITALS — BP 112/47 | HR 60 | Temp 97.7°F | Resp 16 | Ht 61.0 in | Wt 190.0 lb

## 2016-11-24 DIAGNOSIS — R531 Weakness: Secondary | ICD-10-CM | POA: Insufficient documentation

## 2016-11-24 DIAGNOSIS — M25512 Pain in left shoulder: Secondary | ICD-10-CM

## 2016-11-24 DIAGNOSIS — I69354 Hemiplegia and hemiparesis following cerebral infarction affecting left non-dominant side: Secondary | ICD-10-CM | POA: Insufficient documentation

## 2016-11-24 DIAGNOSIS — M150 Primary generalized (osteo)arthritis: Secondary | ICD-10-CM

## 2016-11-24 DIAGNOSIS — M159 Polyosteoarthritis, unspecified: Secondary | ICD-10-CM

## 2016-11-24 DIAGNOSIS — M19012 Primary osteoarthritis, left shoulder: Secondary | ICD-10-CM | POA: Insufficient documentation

## 2016-11-24 DIAGNOSIS — I2510 Atherosclerotic heart disease of native coronary artery without angina pectoris: Secondary | ICD-10-CM | POA: Diagnosis not present

## 2016-11-24 DIAGNOSIS — G40909 Epilepsy, unspecified, not intractable, without status epilepticus: Secondary | ICD-10-CM | POA: Diagnosis not present

## 2016-11-24 DIAGNOSIS — E119 Type 2 diabetes mellitus without complications: Secondary | ICD-10-CM | POA: Diagnosis not present

## 2016-11-24 DIAGNOSIS — I5032 Chronic diastolic (congestive) heart failure: Secondary | ICD-10-CM | POA: Diagnosis not present

## 2016-11-24 DIAGNOSIS — I110 Hypertensive heart disease with heart failure: Secondary | ICD-10-CM | POA: Diagnosis not present

## 2016-11-24 NOTE — Assessment & Plan Note (Signed)
Underlying etiology for L shoulder pain likely bursitis with OA/DJD Prior injection before

## 2016-11-24 NOTE — Progress Notes (Signed)
Subjective:    Patient ID: Samantha Aguirre, female    DOB: 1945-08-26, 71 y.o.   MRN: [id number]  Samantha Aguirre is a 71 y.o. female presenting on 11/24/2016 for Shoulder Pain (PT and OT  recommomded to get xray onset last month)   HPI   Left Shoulder Pain, Acute to Subacute - Reports relatively new problem following hospitalization and CVA in 09/2016, see prior note and telephone note, our office was contacted by her at request of [facility] PT/OT continues to work on L shoulder pain but wanted it X-ray next to evaluate it before they proceed. - Today reports still has pain and difficulty lifting shoulder above head, worse over past 4-6 weeks, has known residual weakness on L side from stroke that has improved significantly, but now limited by pain and motion. Still working with PT/OT but doing light exercise only, to avoid new problem. - No fall or injury on L side - Has some baseline weakness in left hand arm - Taking Tylenol 500mg  x 2 BID, missing afternoon dose. Also taking Baclofen as needed, not helping shoulder movement. Only relief is hot shower. Has a topical muscle rub will start soon. Tried topical diclofenac has plenty at home, some temporary relief - Regarding Tramadol rx on chart, she never started this was given it by hospital but unable to get it filled due to mail order or pharmacy issue, not interested in taking it - Denies chest pain, dyspnea, numbness, weakness worsening or tingling, other joint pain or swelling, fall or injury  Health Maintenance: - Due for [facility] Eye Exam, reportedly had it completed last week on 11/19/16 at [facility], awaiting [facility] report to update chart - Due for mammogram, last done 01/2014, last apt discussed this, she has not been able to contact them yet to schedule, she is asymptomatic and will try to schedule  Depression screen [facility] 2/9 11/24/2016 10/06/2016 11/13/2015  Decreased Interest 0 1 3  Down, Depressed, Hopeless 0 1 1  PHQ -  2 Score 0 2 4  Altered sleeping - 3 2  Tired, decreased energy - 2 3  Change in appetite - 2 1  Feeling bad or failure about yourself  - 1 3  Trouble concentrating - 3 1  Moving slowly or fidgety/restless - 1 0  Suicidal thoughts - 0 1  PHQ-9 Score - 14 15  Difficult doing work/chores - Somewhat difficult Somewhat difficult    Social History   Tobacco Use  . Smoking status: Current Every Day Smoker    Packs/day: 0.50    Years: 50.00    Pack years: 25.00    Types: Cigarettes  . Smokeless tobacco: Current User  Substance Use Topics  . Alcohol use: No  . Drug use: No    Review of Systems Per HPI unless specifically indicated above     Objective:    BP (!) 112/47   Pulse 60   Temp 97.7 F (36.5 C) (Oral)   Resp 16   Ht 5\' 1"  (1.549 m)   Wt 190 lb (86.2 kg)   BMI 35.90 kg/m   Wt Readings from Last 3 Encounters:  11/24/16 190 lb (86.2 kg)  10/20/16 189 lb (85.7 kg)  10/06/16 189 lb (85.7 kg)    Physical Exam  Constitutional: She is oriented to person, place, and time. She appears well-developed and well-nourished. No distress.  Chronically ill appearing elderly 71 yr female but currently well, comfortable, cooperative  Using cane with  4 point base  HENT:  Head: Normocephalic and atraumatic.  Mouth/Throat: Oropharynx is clear and moist.  Eyes: Conjunctivae are normal. Right eye exhibits no discharge. Left eye exhibits no discharge.  Cardiovascular: Normal rate.  Pulmonary/Chest: Effort normal.  Musculoskeletal: She exhibits no edema.  Left Shoulder Inspection: Normal appearance bilateral symmetrical Palpation: Mild discomfort tender to palpation over anterior shoulder  ROM: Significantly reduced active ROM forward flex and abduction and internal rotation behind back. < 45* forward, needs to use other hand to assist. - Limited by pain Special Testing: Rotator cuff testing difficult to appreciate due to cooperation, but seems intact, Hawkin's AC impingement  POSITIVE for pain Left shoulder Strength: Normal strength 4 to 5 /5 left upper ext biceps flex, ext, grip, limited shoulder strength due to pain Neurovascular: Distally intact pulses, sensation to light touch  Neurological: She is alert and oriented to person, place, and time.  Skin: Skin is warm and dry. No rash noted. She is not diaphoretic. No erythema.  Psychiatric: She has a normal mood and affect. Her behavior is normal.  Well groomed, good eye contact, normal speech and thoughts  Nursing note and vitals reviewed.  Results for orders placed or performed during the hospital encounter of 16/10/96  Basic metabolic panel  Result Value Ref Range   Sodium 139 135 - 145 mmol/L   Potassium 3.8 3.5 - 5.1 mmol/L   Chloride 104 101 - 111 mmol/L   CO2 25 22 - 32 mmol/L   Glucose, Bld 121 (H) 65 - 99 mg/dL   BUN 14 6 - 20 mg/dL   Creatinine, Ser 0.65 0.44 - 1.00 mg/dL   Calcium 8.8 (L) 8.9 - 10.3 mg/dL   GFR calc non Af Amer >60 >60 mL/min   GFR calc Af Amer >60 >60 mL/min   Anion gap 10 5 - 15  CBC  Result Value Ref Range   WBC 8.0 3.6 - 11.0 K/uL   RBC 3.94 3.80 - 5.20 MIL/uL   Hemoglobin 11.5 (L) 12.0 - 16.0 g/dL   HCT 33.7 (L) 35.0 - 47.0 %   MCV 85.6 80.0 - 100.0 fL   MCH 29.2 26.0 - 34.0 pg   MCHC 34.1 32.0 - 36.0 g/dL   RDW 14.6 (H) 11.5 - 14.5 %   Platelets 307 150 - 440 K/uL  Troponin I  Result Value Ref Range   Troponin I <0.03 <0.03 ng/mL  Glucose, capillary  Result Value Ref Range   Glucose-Capillary 125 (H) 65 - 99 mg/dL      Assessment & Plan:   Problem List Items Addressed This Visit    Left-sided weakness    Stable to improved, secondary to prior CVA - Continues on Eliquis - Continue HH PT with [facility], proceed with shoulder x-ray, will follow-up      Relevant Orders   DG Shoulder Left   Osteoarthritis of multiple joints    Underlying etiology for L shoulder pain likely bursitis with OA/DJD Prior injection before      Relevant Orders   DG Shoulder  Left    Other Visit Diagnoses    Acute pain of left shoulder    -  Primary  Consistent with subacute L-shoulder bursitis vs rotator cuff tendinopathy with some reduced active ROM but without significant evidence of muscle tear (seems some baseline weakness from CVA) - 71 year old patient with known underlying arthritis also prior L shoulder OA/DJD before including steroid injections in past - No imaging on chart for L  shoulder  Plan: 1. Proceed with requested L shoulder X-ray per [facility] PT/OT will forward results to [facility] and notify patient 2. Increase Tylenol dosing to 500mg  x 2 for 1000mg  TID regularly for now 3. Avoid NSAID 4. Continue Baclofen, topical diclofenac, heating pad and add muscle rub 5. If not improved with continued PT/OT next step may be L subacromial steroid injection, and possibly return to Orthopedics if need - or can consider other med management may try the Tramadol new rx PRN to help her do more rehab    Relevant Orders   DG Shoulder Left      No orders of the defined types were placed in this encounter.   Follow up plan: Return in about 4 weeks (around 12/22/2016) for Left Shoulder Pain (?injection).  Nobie Putnam, [facility] 11/24/2016, 12:59 PM

## 2016-11-24 NOTE — Assessment & Plan Note (Signed)
Stable to improved, secondary to prior CVA - Continues on Eliquis - Continue HH PT with [facility], proceed with shoulder x-ray, will follow-up

## 2016-11-24 NOTE — Patient Instructions (Addendum)
Thank you for coming to the clinic today.  1. Left Shoulder X-ray today, stay tuned for results - Will reach out to [facility] OT PT with results by phone / fax when we receive them  Most likely sounds like a flare of Left shoulder Bursitis from arthritis  Recommend to start taking Tylenol Extra Strength 500mg  tabs - take 1 to 2 tabs per dose (max 1000mg ) every 6-8 hours for pain (take regularly, don't skip a dose for next 7 days), max 24 hour daily dose is 6 tablets or 3000mg . In the future you can repeat the same everyday Tylenol course for 1-2 weeks at a time.   Keep using Voltaren gel as needed on shoulder  In future if not improved we can return for Shoulder injection, and in future next step may be Tramadol to help with PT only.  For stomach acid, may take Omeprazole 20mg  once daily every day instead of taking twice, and STOP Carafate.  For Mammogram screening for breast cancer   Call the [facility] below anytime to schedule your own appointment now that order has been placed.  [facility] [place], [place] [address] Phone: [phone]  Please schedule a Follow-up Appointment to: Return in about 4 weeks (around 12/22/2016) for Left Shoulder Pain (?injection).  If you have any other questions or concerns, please feel free to call the clinic or send a message through [facility]. You may also schedule an earlier appointment if necessary.  Additionally, you may be receiving a survey about your experience at our clinic within a few days to 1 week by e-mail or mail. We value your feedback.  Nobie Putnam, DO [place]

## 2016-11-26 ENCOUNTER — Telehealth: Payer: Self-pay | Admitting: Family Medicine

## 2016-11-26 NOTE — Telephone Encounter (Signed)
Called pt to sched for AWV with Nurse Health Advisor. Last AWV on 03/14/15  Please schedule AWV with [facility] any date . C/b #  [phone] on Skype [email or web address] if you have questions

## 2016-11-27 ENCOUNTER — Telehealth: Payer: Self-pay | Admitting: Family Medicine

## 2016-11-27 NOTE — Telephone Encounter (Signed)
Ben with [facility] said pt refused PT visits the last 2 days due to son's health.  His call back number is [phone]

## 2016-12-08 DIAGNOSIS — E119 Type 2 diabetes mellitus without complications: Secondary | ICD-10-CM | POA: Diagnosis not present

## 2016-12-08 DIAGNOSIS — G40909 Epilepsy, unspecified, not intractable, without status epilepticus: Secondary | ICD-10-CM | POA: Diagnosis not present

## 2016-12-08 DIAGNOSIS — I2510 Atherosclerotic heart disease of native coronary artery without angina pectoris: Secondary | ICD-10-CM | POA: Diagnosis not present

## 2016-12-08 DIAGNOSIS — I69354 Hemiplegia and hemiparesis following cerebral infarction affecting left non-dominant side: Secondary | ICD-10-CM | POA: Diagnosis not present

## 2016-12-08 DIAGNOSIS — I5032 Chronic diastolic (congestive) heart failure: Secondary | ICD-10-CM | POA: Diagnosis not present

## 2016-12-08 DIAGNOSIS — I110 Hypertensive heart disease with heart failure: Secondary | ICD-10-CM | POA: Diagnosis not present

## 2016-12-10 DIAGNOSIS — I5032 Chronic diastolic (congestive) heart failure: Secondary | ICD-10-CM | POA: Diagnosis not present

## 2016-12-10 DIAGNOSIS — I69354 Hemiplegia and hemiparesis following cerebral infarction affecting left non-dominant side: Secondary | ICD-10-CM | POA: Diagnosis not present

## 2016-12-10 DIAGNOSIS — G40909 Epilepsy, unspecified, not intractable, without status epilepticus: Secondary | ICD-10-CM | POA: Diagnosis not present

## 2016-12-10 DIAGNOSIS — E119 Type 2 diabetes mellitus without complications: Secondary | ICD-10-CM | POA: Diagnosis not present

## 2016-12-10 DIAGNOSIS — I110 Hypertensive heart disease with heart failure: Secondary | ICD-10-CM | POA: Diagnosis not present

## 2016-12-10 DIAGNOSIS — I2510 Atherosclerotic heart disease of native coronary artery without angina pectoris: Secondary | ICD-10-CM | POA: Diagnosis not present

## 2016-12-15 DIAGNOSIS — I69354 Hemiplegia and hemiparesis following cerebral infarction affecting left non-dominant side: Secondary | ICD-10-CM | POA: Diagnosis not present

## 2016-12-15 DIAGNOSIS — I110 Hypertensive heart disease with heart failure: Secondary | ICD-10-CM | POA: Diagnosis not present

## 2016-12-15 DIAGNOSIS — I2510 Atherosclerotic heart disease of native coronary artery without angina pectoris: Secondary | ICD-10-CM | POA: Diagnosis not present

## 2016-12-15 DIAGNOSIS — I5032 Chronic diastolic (congestive) heart failure: Secondary | ICD-10-CM | POA: Diagnosis not present

## 2016-12-15 DIAGNOSIS — G40909 Epilepsy, unspecified, not intractable, without status epilepticus: Secondary | ICD-10-CM | POA: Diagnosis not present

## 2016-12-15 DIAGNOSIS — E119 Type 2 diabetes mellitus without complications: Secondary | ICD-10-CM | POA: Diagnosis not present

## 2016-12-16 IMAGING — MR MR HEAD W/O CM
9 of 10 series · 40 of 48 positions shown · non-contrast
Comparison: CTA head and neck 09/16/2015, brain MRI 05/14/2015 and
earlier.

CLINICAL DATA: 70-year-old female presenting with right side
weakness and numbness, right side facial droop. Initial encounter.

EXAM:
MRI HEAD WITHOUT CONTRAST
TECHNIQUE: Multiplanar, multiecho pulse sequences of the brain and surrounding
structures were obtained without intravenous contrast.

[Series 2: T1 · sagittal · 5.0mm · 0.45mm/px · 3 of 30 slices shown]
[im 1/30]
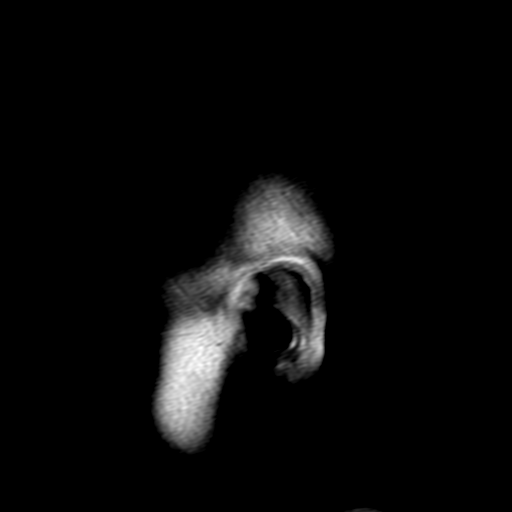
[im 15/30]
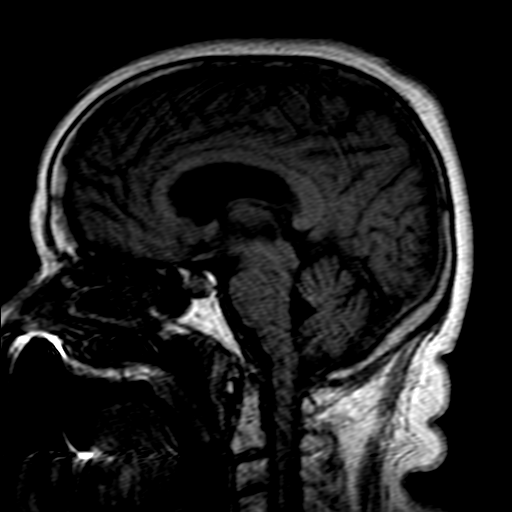
[im 30/30]
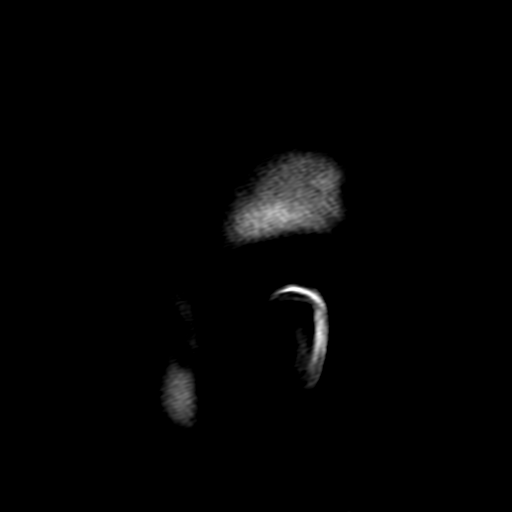

[Series 4: DWI · axial · 4.0mm · 0.94mm/px · z∈[-69,+97]mm · 5 of 43 slices shown (1 of 4)]
[im 1/43]
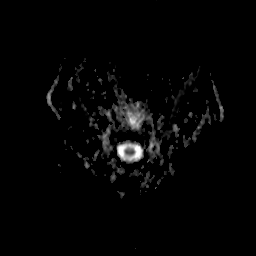
[im 11/43]
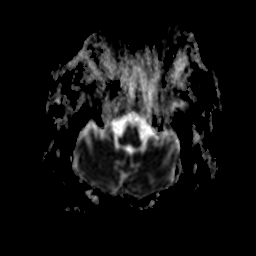
[im 22/43]
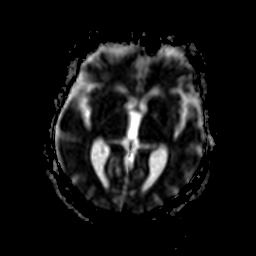
[im 32/43]
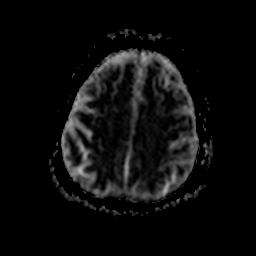
[im 43/43]
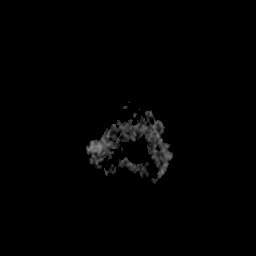

[Series 6: DWI · coronal · 5.0mm · 1.80mm/px · 5 of 39 slices shown (2 of 4)]
[im 1/39]
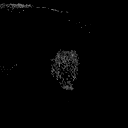
[im 10/39]
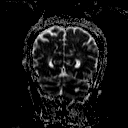
[im 20/39]
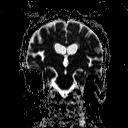
[im 29/39]
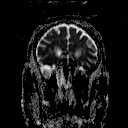
[im 39/39]
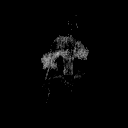

[Series 7: DWI · axial · 4.0mm · 0.94mm/px · z∈[-69,+97]mm · 6 of 43 slices shown (3 of 4)]
[im 1/43]
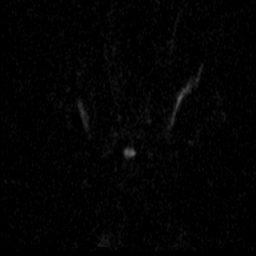
[im 9/43]
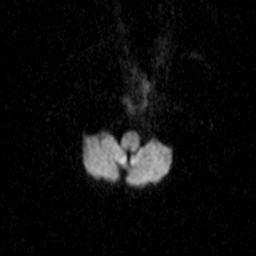
[im 17/43]
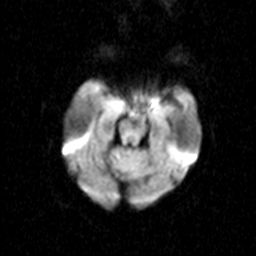
[im 26/43]
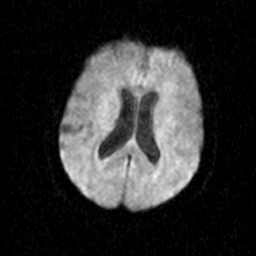
[im 34/43]
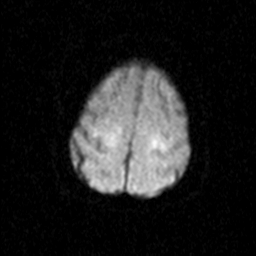
[im 43/43]
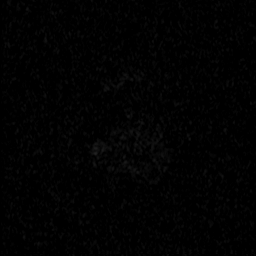

[Series 8: DWI · coronal · 5.0mm · 1.80mm/px · 5 of 36 slices shown (4 of 4)]
[im 1/36]
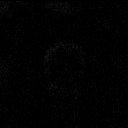
[im 9/36]
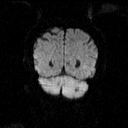
[im 18/36]
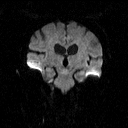
[im 27/36]
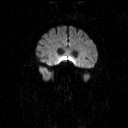
[im 36/36]
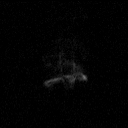

[Series 9: T2 · axial · 5.0mm · 0.45mm/px · z∈[-79,+89]mm · 4 of 27 slices shown (1 of 3)]
[im 1/27]
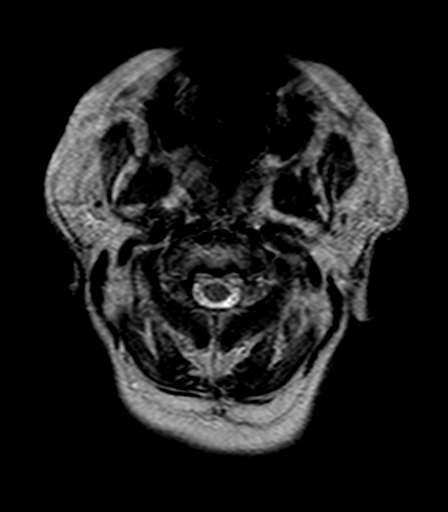
[im 9/27]
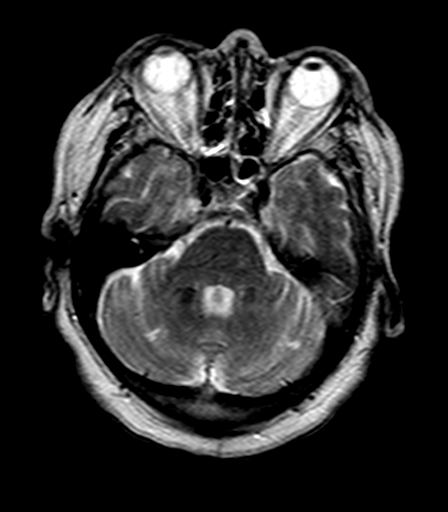
[im 18/27]
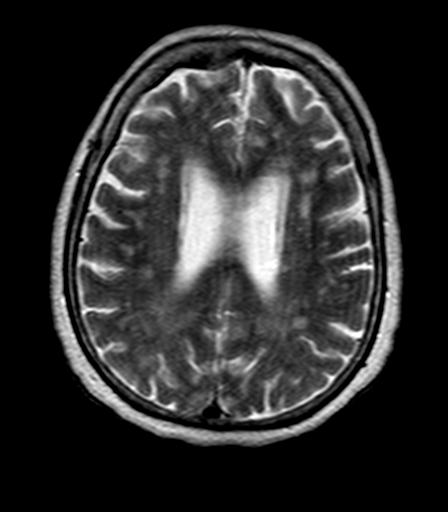
[im 27/27]
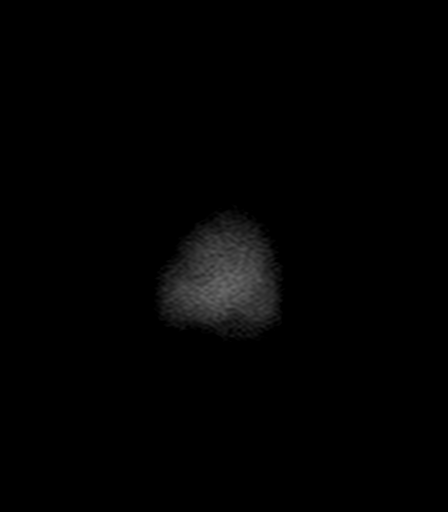

[Series 10: FLAIR · axial · 5.0mm · 0.90mm/px · z∈[-79,+89]mm · 4 of 27 slices shown]
[im 1/27]
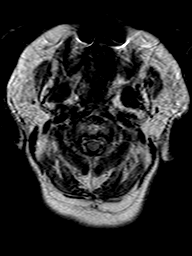
[im 9/27]
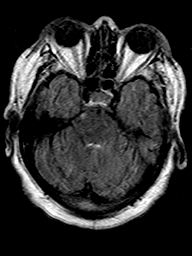
[im 18/27]
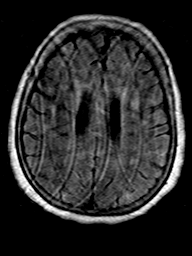
[im 27/27]
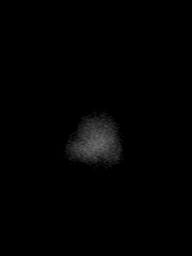

[Series 11: T2 · axial · 5.0mm · 0.45mm/px · z∈[-79,+89]mm · 4 of 27 slices shown (2 of 3)]
[im 1/27]
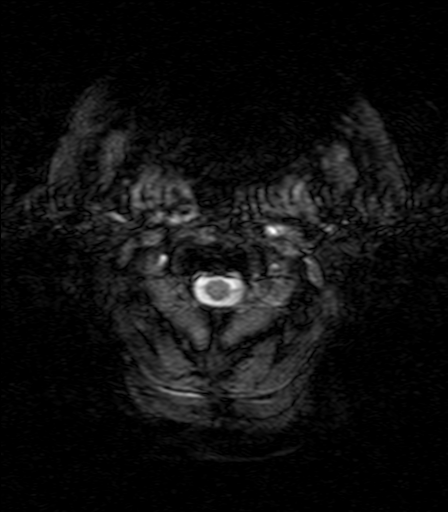
[im 9/27]
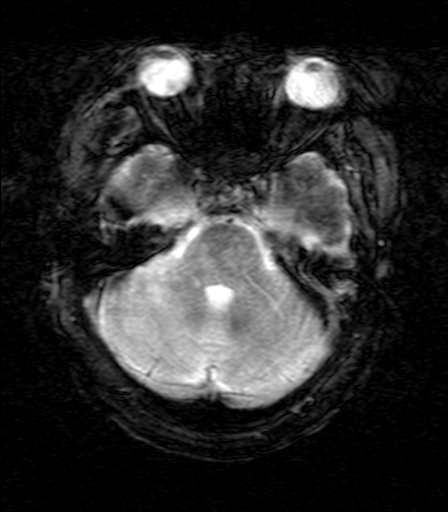
[im 18/27]
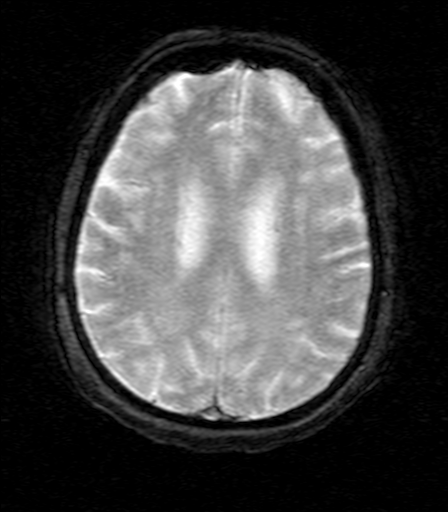
[im 27/27]
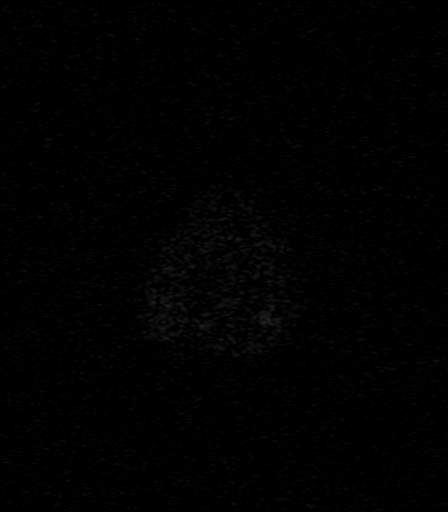

[Series 13: T2 · coronal · 5.0mm · 0.45mm/px · 4 of 30 slices shown (3 of 3)]
[im 1/30]
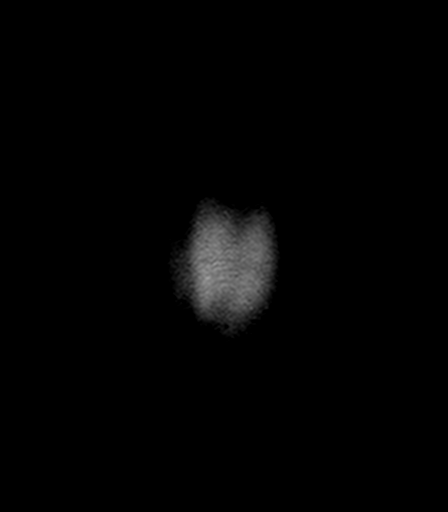
[im 10/30]
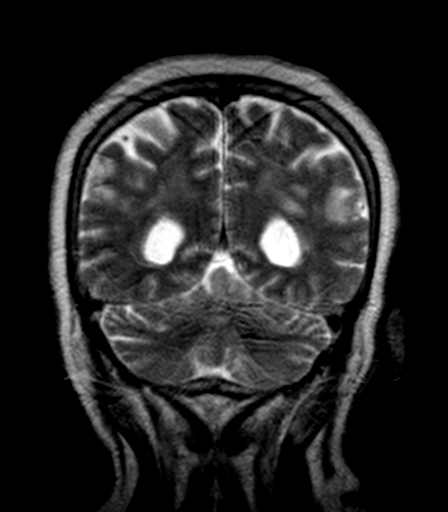
[im 20/30]
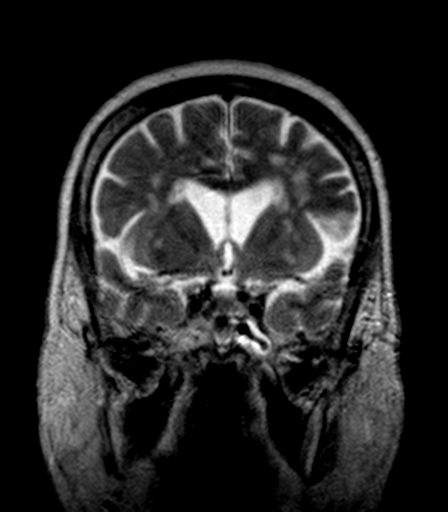
[im 30/30]
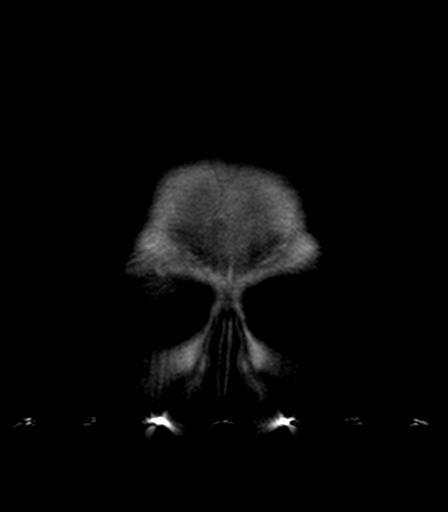

[40 of 48 positions shown; findings below may reference images not displayed]

FINDINGS: Small left cavernous sinus and right superior parasagittal
meningiomas are difficult to delineate by MRI without IV contrast.
These were demonstrated to be stable on the recent 09/16/2015 CTA.

Major intracranial vascular flow voids are stable. No restricted
diffusion or evidence of acute infarction.

Stable gray and white matter signal [REDACTED] a with moderate for
age scattered nonspecific white matter T2 and FLAIR hyperintensity.
No definite cortical encephalomalacia. Small chronic micro
hemorrhage in the left lateral occipital lobe is stable. Chronic
lacunar infarcts in both cerebellar hemispheres are stable.

Stable visualized internal auditory structures. Mastoids remain
clear. Mild ethmoid and sphenoid sinus mucosal thickening today.
Stable orbit and scalp soft tissues. Normal bone marrow signal.
Negative visualized cervical spine. No midline shift, mass effect,
evidence of mass lesion, ventriculomegaly, extra-axial collection or
acute intracranial hemorrhage. Cervicomedullary junction and
pituitary are within normal limits.
IMPRESSION: 1. No acute intracranial abnormality. Stable noncontrast MRI
appearance of the brain [REDACTED]. Cerebral white matter and
cerebellar signal changes most compatible with chronic small vessel
disease.
2. Suspected small left cavernous sinus and superior right
parasagittal meningiomas difficult to delineate without IV contrast
but appeared stable on the recent 09/16/2015 CTA.

## 2016-12-23 ENCOUNTER — Ambulatory Visit: Payer: Medicare Other | Admitting: Family Medicine

## 2017-01-15 ENCOUNTER — Encounter: Payer: Self-pay | Admitting: Family Medicine

## 2017-01-15 ENCOUNTER — Ambulatory Visit: Payer: Medicare Other | Admitting: Family Medicine

## 2017-01-15 DIAGNOSIS — H269 Unspecified cataract: Secondary | ICD-10-CM | POA: Insufficient documentation

## 2017-01-20 ENCOUNTER — Ambulatory Visit: Payer: Medicare Other | Admitting: Family Medicine

## 2017-01-27 ENCOUNTER — Ambulatory Visit: Payer: Medicare Other | Admitting: Family Medicine

## 2017-01-27 ENCOUNTER — Ambulatory Visit: Payer: Medicare Other

## 2017-02-10 ENCOUNTER — Ambulatory Visit: Payer: Medicare Other | Admitting: Family Medicine

## 2017-02-10 ENCOUNTER — Ambulatory Visit: Payer: Medicare Other

## 2017-02-13 ENCOUNTER — Telehealth: Payer: Self-pay

## 2017-02-13 ENCOUNTER — Encounter: Payer: Self-pay | Admitting: Family Medicine

## 2017-02-13 DIAGNOSIS — G43919 Migraine, unspecified, intractable, without status migrainosus: Secondary | ICD-10-CM

## 2017-02-13 DIAGNOSIS — G43909 Migraine, unspecified, not intractable, without status migrainosus: Secondary | ICD-10-CM | POA: Insufficient documentation

## 2017-02-13 MED ORDER — SUMATRIPTAN SUCCINATE 25 MG PO TABS: 25.0000 mg | ORAL_TABLET | Freq: Once | ORAL | 0 refills | Status: DC | PRN

## 2017-02-13 NOTE — Telephone Encounter (Signed)
As per patient having HA pain in Left side, head pain radiate to back has blurry vision but Hx of cataract no dizziness no slurred speech, her B/P reading 170/63 and 160/73 onset today.

## 2017-02-13 NOTE — Telephone Encounter (Signed)
Last seen in 11/2016. Has history of CVA, also reports history of migraine, not been focus of prior visit. Now calls today for advice with acute onset Left sided headache moderate to severe throbbing or episodic every 15 min, posterior head radiating on L side only to eye or frontal, feels like prior migraine, unsure trigger, taking Tylenol PRN mild relief. She is in blood thinner Apixaban. Never on triptan.  Advised her that difficult to evaluate migraine / headache and evaluation over the phone, she should come in for office visit in future if not improving. Her BP has improved since last check now down to 159/62, she feels fine otherwise, denies weakness, numbness tingling or stroke symptoms. She is asking about what else she can take for headache, I advised Excedrin migraine PRN, and avoid NSAIds due to Eliquis. Also agree to send Sumatriptan, discussed dosing and how to use, precaution if side effect on dosing, and she wanted this sent through mail order, she was advised to follow-up more urgently at hospital ED if worsening signs or symptoms  Nobie Putnam, [facility] 02/13/2017, 12:30 PM

## 2017-02-18 ENCOUNTER — Inpatient Hospital Stay
Admission: EM | Admit: 2017-02-18 | Discharge: 2017-02-21 | DRG: 191 | Disposition: A | Discharge status: Home / Self Care | Payer: Medicare Other | Attending: Internal Medicine | Admitting: Internal Medicine

## 2017-02-18 ENCOUNTER — Emergency Department: Payer: Medicare Other

## 2017-02-18 ENCOUNTER — Other Ambulatory Visit: Payer: Self-pay

## 2017-02-18 DIAGNOSIS — Z66 Do not resuscitate: Secondary | ICD-10-CM | POA: Diagnosis present

## 2017-02-18 DIAGNOSIS — K621 Rectal polyp: Secondary | ICD-10-CM

## 2017-02-18 DIAGNOSIS — E119 Type 2 diabetes mellitus without complications: Secondary | ICD-10-CM | POA: Diagnosis present

## 2017-02-18 DIAGNOSIS — I5030 Unspecified diastolic (congestive) heart failure: Secondary | ICD-10-CM | POA: Diagnosis not present

## 2017-02-18 DIAGNOSIS — K5790 Diverticulosis of intestine, part unspecified, without perforation or abscess without bleeding: Secondary | ICD-10-CM | POA: Diagnosis not present

## 2017-02-18 DIAGNOSIS — Z6835 Body mass index (BMI) 35.0-35.9, adult: Secondary | ICD-10-CM

## 2017-02-18 DIAGNOSIS — E538 Deficiency of other specified B group vitamins: Secondary | ICD-10-CM | POA: Diagnosis present

## 2017-02-18 DIAGNOSIS — D123 Benign neoplasm of transverse colon: Secondary | ICD-10-CM | POA: Diagnosis not present

## 2017-02-18 DIAGNOSIS — Z888 Allergy status to other drugs, medicaments and biological substances status: Secondary | ICD-10-CM

## 2017-02-18 DIAGNOSIS — D509 Iron deficiency anemia, unspecified: Secondary | ICD-10-CM | POA: Diagnosis not present

## 2017-02-18 DIAGNOSIS — J449 Chronic obstructive pulmonary disease, unspecified: Secondary | ICD-10-CM | POA: Diagnosis not present

## 2017-02-18 DIAGNOSIS — E039 Hypothyroidism, unspecified: Secondary | ICD-10-CM | POA: Diagnosis present

## 2017-02-18 DIAGNOSIS — D124 Benign neoplasm of descending colon: Secondary | ICD-10-CM | POA: Diagnosis present

## 2017-02-18 DIAGNOSIS — K635 Polyp of colon: Secondary | ICD-10-CM | POA: Diagnosis not present

## 2017-02-18 DIAGNOSIS — R0602 Shortness of breath: Secondary | ICD-10-CM | POA: Diagnosis not present

## 2017-02-18 DIAGNOSIS — D649 Anemia, unspecified: Secondary | ICD-10-CM

## 2017-02-18 DIAGNOSIS — I482 Chronic atrial fibrillation: Secondary | ICD-10-CM | POA: Diagnosis present

## 2017-02-18 DIAGNOSIS — D500 Iron deficiency anemia secondary to blood loss (chronic): Secondary | ICD-10-CM | POA: Diagnosis present

## 2017-02-18 DIAGNOSIS — D120 Benign neoplasm of cecum: Secondary | ICD-10-CM | POA: Diagnosis present

## 2017-02-18 DIAGNOSIS — E785 Hyperlipidemia, unspecified: Secondary | ICD-10-CM | POA: Diagnosis present

## 2017-02-18 DIAGNOSIS — J441 Chronic obstructive pulmonary disease with (acute) exacerbation: Secondary | ICD-10-CM | POA: Diagnosis present

## 2017-02-18 DIAGNOSIS — K219 Gastro-esophageal reflux disease without esophagitis: Secondary | ICD-10-CM | POA: Diagnosis present

## 2017-02-18 DIAGNOSIS — R569 Unspecified convulsions: Secondary | ICD-10-CM | POA: Diagnosis present

## 2017-02-18 DIAGNOSIS — Z8673 Personal history of transient ischemic attack (TIA), and cerebral infarction without residual deficits: Secondary | ICD-10-CM | POA: Diagnosis not present

## 2017-02-18 DIAGNOSIS — I252 Old myocardial infarction: Secondary | ICD-10-CM

## 2017-02-18 DIAGNOSIS — Z91018 Allergy to other foods: Secondary | ICD-10-CM

## 2017-02-18 DIAGNOSIS — Z91041 Radiographic dye allergy status: Secondary | ICD-10-CM

## 2017-02-18 DIAGNOSIS — I5032 Chronic diastolic (congestive) heart failure: Secondary | ICD-10-CM | POA: Diagnosis present

## 2017-02-18 DIAGNOSIS — I2510 Atherosclerotic heart disease of native coronary artery without angina pectoris: Secondary | ICD-10-CM | POA: Diagnosis present

## 2017-02-18 DIAGNOSIS — D128 Benign neoplasm of rectum: Secondary | ICD-10-CM | POA: Diagnosis present

## 2017-02-18 DIAGNOSIS — D126 Benign neoplasm of colon, unspecified: Secondary | ICD-10-CM | POA: Diagnosis not present

## 2017-02-18 DIAGNOSIS — D122 Benign neoplasm of ascending colon: Secondary | ICD-10-CM | POA: Diagnosis present

## 2017-02-18 DIAGNOSIS — I110 Hypertensive heart disease with heart failure: Secondary | ICD-10-CM | POA: Diagnosis present

## 2017-02-18 DIAGNOSIS — K922 Gastrointestinal hemorrhage, unspecified: Secondary | ICD-10-CM | POA: Diagnosis not present

## 2017-02-18 DIAGNOSIS — Z79899 Other long term (current) drug therapy: Secondary | ICD-10-CM

## 2017-02-18 DIAGNOSIS — F17210 Nicotine dependence, cigarettes, uncomplicated: Secondary | ICD-10-CM | POA: Diagnosis present

## 2017-02-18 DIAGNOSIS — R079 Chest pain, unspecified: Secondary | ICD-10-CM | POA: Diagnosis not present

## 2017-02-18 DIAGNOSIS — Z885 Allergy status to narcotic agent status: Secondary | ICD-10-CM

## 2017-02-18 DIAGNOSIS — E1140 Type 2 diabetes mellitus with diabetic neuropathy, unspecified: Secondary | ICD-10-CM | POA: Diagnosis not present

## 2017-02-18 DIAGNOSIS — Z7901 Long term (current) use of anticoagulants: Secondary | ICD-10-CM | POA: Diagnosis not present

## 2017-02-18 DIAGNOSIS — Z7984 Long term (current) use of oral hypoglycemic drugs: Secondary | ICD-10-CM

## 2017-02-18 DIAGNOSIS — Z85828 Personal history of other malignant neoplasm of skin: Secondary | ICD-10-CM

## 2017-02-18 LAB — BASIC METABOLIC PANEL
Anion gap: 10 (ref 5–15)
BUN: 9 mg/dL (ref 6–20)
CO2: 22 mmol/L (ref 22–32)
CREATININE: 0.66 mg/dL (ref 0.44–1.00)
Calcium: 8.3 mg/dL — ABNORMAL LOW (ref 8.9–10.3)
Chloride: 105 mmol/L (ref 101–111)
GFR calc Af Amer: >60 mL/min (ref >60)
GLUCOSE: 202 mg/dL — AB (ref 65–99)
Potassium: 3.6 mmol/L (ref 3.5–5.1)
Sodium: 137 mmol/L (ref 135–145)

## 2017-02-18 LAB — ABO/RH: ABO/RH(D): O POS

## 2017-02-18 LAB — GLUCOSE, CAPILLARY
GLUCOSE-CAPILLARY: 298 mg/dL — AB (ref 65–99)
Glucose-Capillary: 280 mg/dL — ABNORMAL HIGH (ref 65–99)

## 2017-02-18 LAB — CBC WITH DIFFERENTIAL/PLATELET
Basophils Absolute: 0.1 10*3/uL (ref 0–0.1)
Basophils Relative: 1 %
EOS PCT: 1 %
Eosinophils Absolute: 0.1 10*3/uL (ref 0–0.7)
HCT: 23 % — ABNORMAL LOW (ref 35.0–47.0)
Hemoglobin: 7.1 g/dL — ABNORMAL LOW (ref 12.0–16.0)
LYMPHS ABS: 2.4 10*3/uL (ref 1.0–3.6)
LYMPHS PCT: 31 %
MCH: 21.8 pg — AB (ref 26.0–34.0)
MCHC: 30.9 g/dL — AB (ref 32.0–36.0)
MCV: 70.6 fL — AB (ref 80.0–100.0)
MONO ABS: 0.7 10*3/uL (ref 0.2–0.9)
MONOS PCT: 9 %
Neutro Abs: 4.5 10*3/uL (ref 1.4–6.5)
Neutrophils Relative %: 58 %
PLATELETS: 266 10*3/uL (ref 150–440)
RBC: 3.26 MIL/uL — AB (ref 3.80–5.20)
RDW: 19.9 % — AB (ref 11.5–14.5)
WBC: 7.8 10*3/uL (ref 3.6–11.0)

## 2017-02-18 LAB — HEMOGLOBIN AND HEMATOCRIT, BLOOD
HCT: 23.1 % — ABNORMAL LOW (ref 35.0–47.0)
HEMOGLOBIN: 6.9 g/dL — AB (ref 12.0–16.0)

## 2017-02-18 LAB — TROPONIN I: Troponin I: <0.03 ng/mL (ref <0.03)

## 2017-02-18 LAB — PREPARE RBC (CROSSMATCH)

## 2017-02-18 LAB — FOLATE: FOLATE: 23 ng/mL (ref >5.9)

## 2017-02-18 LAB — RETICULOCYTES
RBC.: 3.22 MIL/uL — AB (ref 3.80–5.20)
RETIC CT PCT: 3 % (ref 0.4–3.1)
Retic Count, Absolute: 96.6 10*3/uL (ref 19.0–183.0)

## 2017-02-18 LAB — TSH: TSH: 0.402 u[IU]/mL (ref 0.350–4.500)

## 2017-02-18 LAB — FERRITIN: Ferritin: 3 ng/mL — ABNORMAL LOW (ref 11–307)

## 2017-02-18 LAB — IRON AND TIBC
Iron: 8 ug/dL — ABNORMAL LOW (ref 28–170)
Saturation Ratios: 2 % — ABNORMAL LOW (ref 10.4–31.8)
TIBC: 415 ug/dL (ref 250–450)
UIBC: 407 ug/dL

## 2017-02-18 MED ORDER — SUMATRIPTAN SUCCINATE 50 MG PO TABS
25.0000 mg | ORAL_TABLET | Freq: Once | ORAL | Status: DC | PRN
  Filled 2017-02-18: qty 1

## 2017-02-18 MED ORDER — HYDROCODONE-ACETAMINOPHEN 5-325 MG PO TABS: 1.0000 | ORAL_TABLET | ORAL | Status: DC | PRN

## 2017-02-18 MED ORDER — SODIUM CHLORIDE 0.9 % IV SOLN
8.0000 mg/h | INTRAVENOUS | Status: DC
  Administered 2017-02-18 – 2017-02-19 (×2): 8 mg/h via INTRAVENOUS
  Filled 2017-02-18 (×2): qty 80

## 2017-02-18 MED ORDER — ALBUTEROL SULFATE (2.5 MG/3ML) 0.083% IN NEBU
7.5000 mg | INHALATION_SOLUTION | Freq: Once | RESPIRATORY_TRACT | Status: AC
  Administered 2017-02-18: 7.5 mg via RESPIRATORY_TRACT
  Filled 2017-02-18: qty 9

## 2017-02-18 MED ORDER — ACETAMINOPHEN 500 MG PO TABS
500.0000 mg | ORAL_TABLET | Freq: Once | ORAL | Status: AC
  Administered 2017-02-18: 19:00:00 500 mg via ORAL
  Filled 2017-02-18: qty 1

## 2017-02-18 MED ORDER — PANTOPRAZOLE SODIUM 40 MG IV SOLR: 40.0000 mg | Freq: Two times a day (BID) | INTRAVENOUS | Status: DC

## 2017-02-18 MED ORDER — NICOTINE 14 MG/24HR TD PT24
14.0000 mg | MEDICATED_PATCH | Freq: Every day | TRANSDERMAL | Status: DC
  Filled 2017-02-18: qty 1

## 2017-02-18 MED ORDER — INSULIN ASPART 100 UNIT/ML ~~LOC~~ SOLN
0.0000 [IU] | Freq: Three times a day (TID) | SUBCUTANEOUS | Status: DC
  Administered 2017-02-19: 11 [IU] via SUBCUTANEOUS
  Administered 2017-02-19 (×2): 7 [IU] via SUBCUTANEOUS
  Administered 2017-02-20 – 2017-02-21 (×3): 3 [IU] via SUBCUTANEOUS
  Filled 2017-02-18 (×4): qty 1

## 2017-02-18 MED ORDER — LISINOPRIL 20 MG PO TABS
20.0000 mg | ORAL_TABLET | Freq: Every day | ORAL | Status: DC
  Administered 2017-02-18 – 2017-02-21 (×4): 20 mg via ORAL
  Filled 2017-02-18 (×4): qty 1

## 2017-02-18 MED ORDER — GABAPENTIN 400 MG PO CAPS
800.0000 mg | ORAL_CAPSULE | Freq: Two times a day (BID) | ORAL | Status: DC
  Administered 2017-02-18 – 2017-02-21 (×6): 800 mg via ORAL
  Filled 2017-02-18: qty 8
  Filled 2017-02-18 (×3): qty 2
  Filled 2017-02-18: qty 8
  Filled 2017-02-18: qty 2

## 2017-02-18 MED ORDER — FLUTICASONE PROPIONATE 50 MCG/ACT NA SUSP
1.0000 | Freq: Every day | NASAL | Status: DC
  Administered 2017-02-19 – 2017-02-21 (×3): 1 via NASAL
  Filled 2017-02-18: qty 16

## 2017-02-18 MED ORDER — ATORVASTATIN CALCIUM 20 MG PO TABS
80.0000 mg | ORAL_TABLET | Freq: Every day | ORAL | Status: DC
  Administered 2017-02-18 – 2017-02-19 (×2): 80 mg via ORAL
  Filled 2017-02-18 (×2): qty 4

## 2017-02-18 MED ORDER — POTASSIUM CHLORIDE CRYS ER 10 MEQ PO TBCR
10.0000 meq | EXTENDED_RELEASE_TABLET | Freq: Every day | ORAL | Status: DC
  Administered 2017-02-19 – 2017-02-21 (×3): 10 meq via ORAL
  Filled 2017-02-18 (×3): qty 1

## 2017-02-18 MED ORDER — ACETAMINOPHEN 325 MG PO TABS: 650.0000 mg | ORAL_TABLET | Freq: Four times a day (QID) | ORAL | Status: DC | PRN

## 2017-02-18 MED ORDER — METHYLPREDNISOLONE SODIUM SUCC 125 MG IJ SOLR
60.0000 mg | Freq: Four times a day (QID) | INTRAMUSCULAR | Status: DC
  Administered 2017-02-18 – 2017-02-19 (×3): 60 mg via INTRAVENOUS
  Filled 2017-02-18 (×4): qty 2

## 2017-02-18 MED ORDER — SENNOSIDES-DOCUSATE SODIUM 8.6-50 MG PO TABS: 1.0000 | ORAL_TABLET | Freq: Every evening | ORAL | Status: DC | PRN

## 2017-02-18 MED ORDER — SODIUM CHLORIDE 0.9 % IV SOLN
80.0000 mg | Freq: Once | INTRAVENOUS | Status: AC
  Administered 2017-02-18: 22:00:00 80 mg via INTRAVENOUS
  Filled 2017-02-18: qty 80

## 2017-02-18 MED ORDER — METHYLPREDNISOLONE SODIUM SUCC 125 MG IJ SOLR
125.0000 mg | Freq: Once | INTRAMUSCULAR | Status: AC
Start: 2017-02-18 — End: 2017-02-18
  Administered 2017-02-18: 125 mg via INTRAVENOUS
  Filled 2017-02-18: qty 2

## 2017-02-18 MED ORDER — SODIUM CHLORIDE 0.9 % IV SOLN
510.0000 mg | Freq: Once | INTRAVENOUS | Status: AC
  Administered 2017-02-18: 23:00:00 510 mg via INTRAVENOUS
  Filled 2017-02-18: qty 17

## 2017-02-18 MED ORDER — NITROGLYCERIN 0.4 MG SL SUBL: 0.4000 mg | SUBLINGUAL_TABLET | SUBLINGUAL | Status: DC | PRN

## 2017-02-18 MED ORDER — SODIUM CHLORIDE 0.9 % IV SOLN: 10.0000 mL/h | Freq: Once | INTRAVENOUS | Status: AC

## 2017-02-18 MED ORDER — DOCUSATE SODIUM 100 MG PO CAPS
100.0000 mg | ORAL_CAPSULE | Freq: Two times a day (BID) | ORAL | Status: DC
  Administered 2017-02-18 – 2017-02-20 (×3): 100 mg via ORAL
  Filled 2017-02-18 (×6): qty 1

## 2017-02-18 MED ORDER — INSULIN ASPART 100 UNIT/ML ~~LOC~~ SOLN
0.0000 [IU] | Freq: Every day | SUBCUTANEOUS | Status: DC
  Administered 2017-02-18 – 2017-02-19 (×2): 3 [IU] via SUBCUTANEOUS
  Filled 2017-02-18 (×2): qty 1

## 2017-02-18 MED ORDER — UMECLIDINIUM BROMIDE 62.5 MCG/INH IN AEPB
1.0000 | INHALATION_SPRAY | Freq: Every day | RESPIRATORY_TRACT | Status: DC
  Administered 2017-02-19 – 2017-02-21 (×3): 1 via RESPIRATORY_TRACT
  Filled 2017-02-18: qty 7

## 2017-02-18 MED ORDER — BACLOFEN 5 MG HALF TABLET
5.0000 mg | ORAL_TABLET | Freq: Three times a day (TID) | ORAL | Status: DC | PRN
  Filled 2017-02-18: qty 2

## 2017-02-18 MED ORDER — ONDANSETRON HCL 4 MG/2ML IJ SOLN
4.0000 mg | Freq: Four times a day (QID) | INTRAMUSCULAR | Status: DC | PRN
Start: 2017-02-18 — End: 2017-02-21

## 2017-02-18 MED ORDER — ONDANSETRON HCL 4 MG PO TABS: 4.0000 mg | ORAL_TABLET | Freq: Four times a day (QID) | ORAL | Status: DC | PRN

## 2017-02-18 MED ORDER — ACETAMINOPHEN 650 MG RE SUPP: 650.0000 mg | Freq: Four times a day (QID) | RECTAL | Status: DC | PRN

## 2017-02-18 MED ORDER — DEXTROSE 5 % IV SOLN
500.0000 mg | Freq: Once | INTRAVENOUS | Status: AC
  Administered 2017-02-18: 500 mg via INTRAVENOUS
  Filled 2017-02-18: qty 500

## 2017-02-18 MED ORDER — POLYETHYLENE GLYCOL 3350 17 G PO PACK
17.0000 g | PACK | Freq: Every day | ORAL | Status: DC
  Administered 2017-02-18: 17 g via ORAL
  Filled 2017-02-18 (×2): qty 1

## 2017-02-18 MED ORDER — SUCRALFATE 1 G PO TABS
1.0000 g | ORAL_TABLET | Freq: Four times a day (QID) | ORAL | Status: DC
  Administered 2017-02-18 – 2017-02-21 (×10): 1 g via ORAL
  Filled 2017-02-18 (×9): qty 1

## 2017-02-18 MED ORDER — DICLOFENAC SODIUM 1 % TD GEL
2.0000 g | Freq: Four times a day (QID) | TRANSDERMAL | Status: DC | PRN
  Filled 2017-02-18: qty 100

## 2017-02-18 MED ORDER — SODIUM CHLORIDE 0.9 % IV SOLN
Freq: Once | INTRAVENOUS | Status: AC
  Administered 2017-02-19: 01:00:00 via INTRAVENOUS

## 2017-02-18 MED ORDER — IPRATROPIUM-ALBUTEROL 0.5-2.5 (3) MG/3ML IN SOLN
3.0000 mL | Freq: Four times a day (QID) | RESPIRATORY_TRACT | Status: DC
  Administered 2017-02-18 – 2017-02-20 (×8): 3 mL via RESPIRATORY_TRACT
  Filled 2017-02-18 (×8): qty 3

## 2017-02-18 MED ORDER — FUROSEMIDE 10 MG/ML IJ SOLN: 20.0000 mg | Freq: Two times a day (BID) | INTRAMUSCULAR | Status: DC | PRN

## 2017-02-18 MED ORDER — DIPHENHYDRAMINE HCL 25 MG PO CAPS
25.0000 mg | ORAL_CAPSULE | Freq: Once | ORAL | Status: DC
  Filled 2017-02-18: qty 1

## 2017-02-18 MED ORDER — INSULIN GLARGINE 100 UNIT/ML ~~LOC~~ SOLN
20.0000 [IU] | Freq: Every day | SUBCUTANEOUS | Status: DC
  Administered 2017-02-18 – 2017-02-19 (×2): 20 [IU] via SUBCUTANEOUS
  Filled 2017-02-18 (×3): qty 0.2

## 2017-02-18 MED ORDER — FLUTICASONE FUROATE-VILANTEROL 100-25 MCG/INH IN AEPB
1.0000 | INHALATION_SPRAY | Freq: Every day | RESPIRATORY_TRACT | Status: DC
  Administered 2017-02-18 – 2017-02-21 (×4): 1 via RESPIRATORY_TRACT
  Filled 2017-02-18: qty 28

## 2017-02-18 MED ORDER — FUROSEMIDE 20 MG PO TABS
10.0000 mg | ORAL_TABLET | Freq: Every day | ORAL | Status: DC
  Administered 2017-02-19 – 2017-02-21 (×3): 10 mg via ORAL
  Filled 2017-02-18 (×3): qty 1

## 2017-02-18 MED ORDER — LEVETIRACETAM 500 MG PO TABS
500.0000 mg | ORAL_TABLET | Freq: Two times a day (BID) | ORAL | Status: DC
  Administered 2017-02-18 – 2017-02-21 (×6): 500 mg via ORAL
  Filled 2017-02-18 (×7): qty 1

## 2017-02-18 MED ORDER — DULOXETINE HCL 30 MG PO CPEP
60.0000 mg | ORAL_CAPSULE | Freq: Every day | ORAL | Status: DC
  Administered 2017-02-18 – 2017-02-20 (×3): 60 mg via ORAL
  Filled 2017-02-18 (×3): qty 2

## 2017-02-18 MED ORDER — BISACODYL 10 MG RE SUPP: 10.0000 mg | Freq: Every day | RECTAL | Status: DC | PRN

## 2017-02-18 MED ORDER — LEVOTHYROXINE SODIUM 50 MCG PO TABS
175.0000 ug | ORAL_TABLET | Freq: Every day | ORAL | Status: DC
  Administered 2017-02-19 – 2017-02-21 (×4): 175 ug via ORAL
  Filled 2017-02-18 (×4): qty 1

## 2017-02-18 NOTE — ED Notes (Signed)
MD Salary at bedside 

## 2017-02-18 NOTE — Consult Note (Addendum)
Cephas Darby, MD [address]  [place]  [place], [place] [address]  Main: [phone]  Fax: [phone] Pager: [phone]   Consultation  Referring Provider:     No ref. provider found Primary Care Physician:  Olin Hauser, DO Primary Gastroenterologist:      none  Reason for Consultation:     Symptomatic anemia  Date of Admission:  02/18/2017 Date of Consultation:  02/19/2017         HPI:   Samantha Aguirre is a 72 y.o. female multiple comorbidities significant for coronary artery disease, CVA, on Eliquis for A. fib, admitted with shortness of breath in the setting of acute anemia and COPD exacerbation. Her hemoglobin was 11.5 in 10/2016, prior to this,  it was 12 in 09/2016. Hemoglobin on admission was 7.1, received blood transfusion. Patient denies rectal bleeding, melena, hematochezia, abdominal pain, nausea, vomiting. She denies taking iron supplements as outpatient. She denies having had an upper endoscopy or colonoscopy in the past because she did not want to have them done. she denies any abdominal surgeries. She does smoke cigarettes.  NSAIDs: None including BC powder or Goody powder  Antiplts/Anticoagulants/Anti thrombotics: Eliquis for A. fib  GI Procedures: none before  Past Medical History:  Diagnosis Date  . Acid reflux   . Arthritis    Bilateral knees, hands, feet  . Asthma   . Atrial fibrillation ([place])   . Cancer ([place])   . Chest pain   . CHF (congestive heart failure) (HCC)    Diastolic CHF  . COPD (chronic obstructive pulmonary disease) ([place])   . Coronary artery disease   . Diabetes mellitus without complication ([place])   . Frequent headaches   . Heart attack ([place])   . High cholesterol   . HLD (hyperlipidemia)   . Hypertension   . MI (myocardial infarction) ([place])   . Seizures ([place])   . Skin cancer 2015   Suspected basal cell carcinoma on nose, surgically removed  . Stroke ([place])   . Thyroid disease   . Tremors of nervous  system     Past Surgical History:  Procedure Laterality Date  . ABDOMINAL HYSTERECTOMY  1976  . APPENDECTOMY  1980  . CAROTID ENDARTERECTOMY    . CHOLECYSTECTOMY  1980  . [place]  . KNEE SURGERY     left  . ROTATOR CUFF REPAIR     right  . TONSILLECTOMY  1950    Prior to Admission medications   Medication Sig Start Date End Date Taking? Authorizing Provider  acetaminophen (TYLENOL) 325 MG tablet Take 650 mg by mouth 2 (two) times daily.    Yes [provider]  albuterol (PROVENTIL HFA;VENTOLIN HFA) 108 (90 Base) MCG/ACT inhaler Inhale 2 puffs into the lungs every 6 (six) hours as needed for wheezing or shortness of breath. 10/08/16  Yes Karamalegos, Devonne Doughty, DO  apixaban (ELIQUIS) 5 MG TABS tablet Take 5 mg by mouth every 12 (twelve) hours.   Yes [provider]  atorvastatin (LIPITOR) 80 MG tablet Take 1 tablet (80 mg total) by mouth daily at 6 PM. 09/24/16  Yes Karamalegos, Devonne Doughty, DO  diclofenac sodium (VOLTAREN) 1 % GEL Apply 2 g topically 4 (four) times daily. 01/11/16  Yes Daymon Larsen, MD  DULoxetine (CYMBALTA) 60 MG capsule Take 1 capsule (60 mg total) by mouth at bedtime. 09/24/16  Yes Karamalegos, Devonne Doughty, DO  fluticasone (FLONASE) 50 MCG/ACT nasal spray Place 1 spray into  both nostrils daily. 10/08/16  Yes Karamalegos, Alexander J, DO  fluticasone furoate-vilanterol (BREO ELLIPTA) 100-25 MCG/INH AEPB Inhale 1 puff into the lungs daily. Patient taking differently: Inhale 1 puff into the lungs daily as needed. For shortness of breath 11/13/15  Yes Karamalegos, Devonne Doughty, DO  furosemide (LASIX) 20 MG tablet Take 10 mg by mouth daily.    Yes Karamalegos, Devonne Doughty, DO  gabapentin (NEURONTIN) 800 MG tablet Take 1 tablet (800 mg total) by mouth 2 (two) times daily. 09/24/16  Yes Karamalegos, Devonne Doughty, DO  glipiZIDE-metformin (METAGLIP) 5-500 MG tablet Take by mouth 2 (two) times daily. 2 tablets every morning and 1 tablet at  bedtime    Yes [provider]  levETIRAcetam (KEPPRA) 500 MG tablet Take 1 tablet (500 mg total) by mouth 2 (two) times daily. 09/19/15  Yes Epifanio Lesches, MD  levothyroxine (SYNTHROID, LEVOTHROID) 175 MCG tablet Take 1 tablet (175 mcg total) by mouth daily before breakfast. 09/24/16  Yes Karamalegos, Devonne Doughty, DO  nitroGLYCERIN (NITROSTAT) 0.4 MG SL tablet Place 1 tablet (0.4 mg total) under the tongue every 5 (five) minutes as needed for chest pain. 02/28/16  Yes Mody, Ulice Bold, MD  omeprazole (PRILOSEC) 20 MG capsule Take 1 capsule (20 mg total) by mouth daily. 10/16/16  Yes Karamalegos, Devonne Doughty, DO  potassium chloride (MICRO-K) 10 MEQ CR capsule Take 1 capsule (10 mEq total) by mouth daily. 09/24/16  Yes Karamalegos, Devonne Doughty, DO  sucralfate (CARAFATE) 1 g tablet Take 1 tablet by mouth 4 (four) times daily. 02/03/17  Yes [provider]  SUMAtriptan (IMITREX) 25 MG tablet Take 1 tablet (25 mg total) by mouth once as needed for up to 1 dose for migraine. May repeat one dose in 2 hours if persists, max in 24 hrs 02/13/17  Yes Karamalegos, Devonne Doughty, DO  umeclidinium bromide (INCRUSE ELLIPTA) 62.5 MCG/INH AEPB Inhale 1 puff into the lungs daily. 11/13/15  Yes Karamalegos, Devonne Doughty, DO  baclofen (LIORESAL) 10 MG tablet Take 0.5-1 tablets (5-10 mg total) by mouth 3 (three) times daily as needed for muscle spasms. Patient not taking: Reported on 02/18/2017 09/24/16   Olin Hauser, DO  lisinopril (PRINIVIL,ZESTRIL) 20 MG tablet Take 1 tablet (20 mg total) by mouth daily. Patient not taking: Reported on 02/18/2017 10/08/16   Olin Hauser, DO    Family History  Problem Relation Age of Onset  . Breast cancer Mother   . Heart disease Mother   . Stroke Mother   . Cancer Mother   . COPD Mother   . Heart disease Father   . Diabetes Father   . Stroke Father   . Alcohol abuse Sister   . Drug abuse Sister   . Stroke Sister   . Cancer Sister   . Mental  illness Sister   . Heart disease Brother   . Arthritis Brother   . Diabetes Brother   . Heart disease Maternal Grandfather   . Heart disease Paternal Grandfather      Social History   Tobacco Use  . Smoking status: Current Every Day Smoker    Packs/day: 0.50    Years: 50.00    Pack years: 25.00    Types: Cigarettes  . Smokeless tobacco: Current User  Substance Use Topics  . Alcohol use: No  . Drug use: No    Allergies as of 02/18/2017 - Review Complete 02/18/2017  Allergen Reaction Noted  . Mushroom extract complex Anaphylaxis 08/16/2015  . Atenolol  08/16/2015  .  Ivp dye [iodinated diagnostic agents] Itching 09/16/2015  . Morphine and related Other (See Comments) 08/16/2015  . Percocet [oxycodone-acetaminophen] Nausea And Vomiting and Other (See Comments) 08/16/2015  . Percodan [oxycodone-aspirin] Nausea And Vomiting and Other (See Comments) 08/16/2015  . Verapamil Other (See Comments) 08/16/2015    Review of Systems:    All systems reviewed and negative except where noted in HPI.   Physical Exam:  Vital signs in last 24 hours: Temp:  [97.8 F (36.6 C)-98.6 F (37 C)] 97.8 F (36.6 C) (02/07 1313) Pulse Rate:  [72-90] 90 (02/07 1313) Resp:  [18-20] 20 (02/07 1313) BP: (118-143)/(54-86) 118/86 (02/07 1313) SpO2:  [90 %-97 %] 90 % (02/07 1313) Last BM Date: 02/17/17 General: In mild distress, pleasant, cooperative Head:  Normocephalic and atraumatic. Eyes:   No icterus.   Conjunctiva pale. PERRLA. Ears:  Normal auditory acuity. Neck:  Supple; no masses or thyroidomegaly Lungs: Respirations even and unlabored. Lungs clear to auscultation bilaterally.   No wheezes, crackles, or rhonchi.  Heart:  Regular rate and rhythm;  Without murmur, clicks, rubs or gallops Abdomen:  Soft, nondistended, obese, nontender. Normal bowel sounds. No appreciable masses or hepatomegaly.  No rebound or guarding.  Rectal:  Not performed. Msk:  Symmetrical without gross deformities.     Extremities:  Without edema, cyanosis or clubbing. Neurologic:  Alert and oriented x 3 Skin:  Intact without significant lesions or rashes. Psych:  Alert and cooperative. Normal affect.  LAB RESULTS: CBC Latest Ref Rng & Units 02/19/2017 02/19/2017 02/19/2017  WBC 3.6 - 11.0 K/uL 14.6(H) TEST WILL BE CREDITED -  Hemoglobin 12.0 - 16.0 g/dL 9.4(L) TEST WILL BE CREDITED 9.1(L)  Hematocrit 35.0 - 47.0 % 30.1(L) TEST WILL BE CREDITED 29.3(L)  Platelets 150 - 440 K/uL 271 TEST WILL BE CREDITED -    BMET BMP Latest Ref Rng & Units 02/19/2017 02/19/2017 02/18/2017  Glucose 65 - 99 mg/dL 255(H) TEST WILL BE CREDITED 202(H)  BUN 6 - 20 mg/dL 13 TEST WILL BE CREDITED 9  Creatinine 0.44 - 1.00 mg/dL 0.79 TEST WILL BE CREDITED 0.66  Sodium 135 - 145 mmol/L 139 TEST WILL BE CREDITED 137  Potassium 3.5 - 5.1 mmol/L 3.1(L) TEST WILL BE CREDITED 3.6  Chloride 101 - 111 mmol/L 106 TEST WILL BE CREDITED 105  CO2 22 - 32 mmol/L 22 TEST WILL BE CREDITED 22  Calcium 8.9 - 10.3 mg/dL 9.2 TEST WILL BE CREDITED 8.3(L)    LFT Hepatic Function Latest Ref Rng & Units 04/18/2016 02/19/2016 09/16/2015  Total Protein 6.5 - 8.1 g/dL 6.8 7.4 7.2  Albumin 3.5 - 5.0 g/dL 4.0 4.3 4.0  AST 15 - 41 U/L '21 25 25  ' ALT 14 - 54 U/L 12(L) 13(L) 16  Alk Phosphatase 38 - 126 U/L 74 88 85  Total Bilirubin 0.3 - 1.2 mg/dL 0.8 0.6 0.5     STUDIES: Dg Chest 1 View  Result Date: 02/18/2017 CLINICAL DATA:  Shortness of Breath EXAM: CHEST 1 VIEW COMPARISON:  October 20, 2016 FINDINGS: There is no edema or consolidation. Heart is upper normal in size with pulmonary vascularity within normal limits. No adenopathy. There is aortic atherosclerosis. There is degenerative change in the thoracic spine. IMPRESSION: No edema or consolidation. Stable cardiac silhouette. There is aortic atherosclerosis. Aortic Atherosclerosis (ICD10-I70.0). Electronically Signed   By: Lowella Grip III M.D.   On: 02/18/2017 12:03      Impression / Plan:   Samantha Aguirre is a 72 y.o. Caucasian female  with history of stroke, coronary artery disease, A. fib on Eliquis, diabetes presents with severe symptomatic anemia. There is no evidence of active GI bleed. There is no evidence of cirrhosis  Anemia: - Her iron studies are consistent with iron deficiency anemia - Administer ferriheme  - She does have low normal B12, recommend to administer B12 1000 MCG intramuscular daily  - Recommend EGD and colonoscopy once her respiratory status improves   Thank you for involving me in the care of this patient.  Will follow along with you    LOS: 1 day   Sherri Sear, MD  02/19/2017, 6:11 PM   Note: This dictation was prepared with Dragon dictation along with smaller phrase technology. Any transcriptional errors that result from this process are unintentional.

## 2017-02-18 NOTE — Progress Notes (Signed)
Patient alert able to answer questions appropriately at this time. Admission profile completed.

## 2017-02-18 NOTE — ED Provider Notes (Signed)
[facility] Emergency Department Provider Note  ____________________________________________   First MD Initiated Contact with Patient 02/18/17 1128     (approximate)  I have reviewed the triage vital signs and the nursing notes.   HISTORY  Chief Complaint Shortness of Breath   HPI Samantha Aguirre is a 72 y.o. female a history of COPD as well as CHF and atrial fibrillation was presenting to the emergency department today with trouble breathing over the past 3 days.  She says that the breathing has been worsening over the past 3 days.  She also reports a moderate to severe pain across the front of her chest which feels like a pressure and worsens with deep breathing.  Patient does not wear home oxygen.  EMS was called and found the patient to be 99% on room air.  However, moving little air in some fields and then wheezing and others.  More wheezing was auscultated after 2 DuoNeb treatments in the field.  Past Medical History:  Diagnosis Date  . Acid reflux   . Arthritis    Bilateral knees, hands, feet  . Asthma   . Atrial fibrillation ([place])   . Cancer ([place])   . Chest pain   . CHF (congestive heart failure) (HCC)    Diastolic CHF  . COPD (chronic obstructive pulmonary disease) ([place])   . Coronary artery disease   . Diabetes mellitus without complication ([place])   . Frequent headaches   . Heart attack ([place])   . High cholesterol   . HLD (hyperlipidemia)   . Hypertension   . MI (myocardial infarction) ([place])   . Seizures ([place])   . Skin cancer 2015   Suspected basal cell carcinoma on nose, surgically removed  . Stroke ([place])   . Thyroid disease   . Tremors of nervous system     Patient Active Problem List   Diagnosis Date Noted  . Migraine 02/13/2017  . Cataracts, bilateral 01/15/2017  . Chronic venous insufficiency 10/06/2016  . Recurrent major depressive disorder, in partial remission ([place]) 10/06/2016  . Left-sided weakness 10/03/2016  . Chest  pain 02/27/2016  . History of cerebrovascular accident (CVA) with residual deficit 02/19/2016  . Thumb pain, left 01/22/2016  . Traumatic closed nondisplaced fracture of base of metacarpal bone of left thumb 01/22/2016  . Colon cancer screening 11/27/2015  . Depression 11/14/2015  . Coronary artery disease 11/13/2015  . Hypertension 11/13/2015  . History of skin cancer 11/13/2015  . Osteoporosis 11/13/2015  . Chronic low back pain 11/13/2015  . Left medial knee pain 11/13/2015  . Osteoarthritis of multiple joints 11/13/2015  . Other specified hypothyroidism 08/16/2015  . Controlled type 2 diabetes mellitus with diabetic neuropathy ([place]) 08/16/2015  . COPD (chronic obstructive pulmonary disease) ([place]) 08/16/2015  . Tobacco abuse 08/16/2015  . HLD (hyperlipidemia) 08/16/2015  . History of CHF (congestive heart failure) 08/16/2015  . Seizures ([place])     Past Surgical History:  Procedure Laterality Date  . ABDOMINAL HYSTERECTOMY  1976  . APPENDECTOMY  1980  . CAROTID ENDARTERECTOMY    . CHOLECYSTECTOMY  1980  . [place]  . KNEE SURGERY     left  . ROTATOR CUFF REPAIR     right  . TONSILLECTOMY  1950    Prior to Admission medications   Medication Sig Start Date End Date Taking? Authorizing Provider  acetaminophen (TYLENOL) 325 MG tablet Take 650 mg by mouth 2 (two) times daily.     [provider]  albuterol (PROVENTIL HFA;VENTOLIN HFA) 108 (90 Base) MCG/ACT inhaler Inhale 2 puffs into the lungs every 6 (six) hours as needed for wheezing or shortness of breath. 10/08/16   Karamalegos, Devonne Doughty, DO  apixaban (ELIQUIS) 5 MG TABS tablet Take 5 mg by mouth every 12 (twelve) hours.    [provider]  atorvastatin (LIPITOR) 80 MG tablet Take 1 tablet (80 mg total) by mouth daily at 6 PM. 09/24/16   Karamalegos, Devonne Doughty, DO  baclofen (LIORESAL) 10 MG tablet Take 0.5-1 tablets (5-10 mg total) by mouth 3 (three) times daily as needed for muscle  spasms. 09/24/16   Karamalegos, Devonne Doughty, DO  diclofenac sodium (VOLTAREN) 1 % GEL Apply 2 g topically 4 (four) times daily. 01/11/16   Daymon Larsen, MD  DULoxetine (CYMBALTA) 60 MG capsule Take 1 capsule (60 mg total) by mouth at bedtime. 09/24/16   Karamalegos, Devonne Doughty, DO  fluticasone (FLONASE) 50 MCG/ACT nasal spray Place 1 spray into both nostrils daily. 10/08/16   Karamalegos, Alexander J, DO  fluticasone furoate-vilanterol (BREO ELLIPTA) 100-25 MCG/INH AEPB Inhale 1 puff into the lungs daily. Patient taking differently: Inhale 1 puff into the lungs daily as needed. For shortness of breath 11/13/15   Olin Hauser, DO  furosemide (LASIX) 20 MG tablet Take 10 mg by mouth daily.     Karamalegos, Alexander J, DO  gabapentin (NEURONTIN) 800 MG tablet Take 1 tablet (800 mg total) by mouth 2 (two) times daily. 09/24/16   Karamalegos, Devonne Doughty, DO  glipiZIDE-metformin (METAGLIP) 5-500 MG tablet Take 1-2 tablets by mouth 2 (two) times daily. 2 tablets every morning and 1 tablet at bedtime    [provider]  levETIRAcetam (KEPPRA) 500 MG tablet Take 1 tablet (500 mg total) by mouth 2 (two) times daily. 09/19/15   Epifanio Lesches, MD  levothyroxine (SYNTHROID, LEVOTHROID) 175 MCG tablet Take 1 tablet (175 mcg total) by mouth daily before breakfast. 09/24/16   Karamalegos, Devonne Doughty, DO  lisinopril (PRINIVIL,ZESTRIL) 20 MG tablet Take 1 tablet (20 mg total) by mouth daily. 10/08/16   Karamalegos, Devonne Doughty, DO  nitroGLYCERIN (NITROSTAT) 0.4 MG SL tablet Place 1 tablet (0.4 mg total) under the tongue every 5 (five) minutes as needed for chest pain. 02/28/16   Bettey Costa, MD  omeprazole (PRILOSEC) 20 MG capsule Take 1 capsule (20 mg total) by mouth daily. 10/16/16   Karamalegos, Devonne Doughty, DO  potassium chloride (MICRO-K) 10 MEQ CR capsule Take 1 capsule (10 mEq total) by mouth daily. 09/24/16   Karamalegos, Devonne Doughty, DO  SUMAtriptan (IMITREX) 25 MG tablet Take 1 tablet  (25 mg total) by mouth once as needed for up to 1 dose for migraine. May repeat one dose in 2 hours if persists, max in 24 hrs 02/13/17   Karamalegos, Devonne Doughty, DO  umeclidinium bromide (INCRUSE ELLIPTA) 62.5 MCG/INH AEPB Inhale 1 puff into the lungs daily. 11/13/15   Karamalegos, Devonne Doughty, DO    Allergies Mushroom extract complex; Atenolol; Ivp dye [iodinated diagnostic agents]; Morphine and related; Percocet [oxycodone-acetaminophen]; Percodan [oxycodone-aspirin]; and Verapamil  Family History  Problem Relation Age of Onset  . Breast cancer Mother   . Heart disease Mother   . Stroke Mother   . Cancer Mother   . COPD Mother   . Heart disease Father   . Diabetes Father   . Stroke Father   . Alcohol abuse Sister   . Drug abuse Sister   . Stroke Sister   . Cancer  Sister   . Mental illness Sister   . Heart disease Brother   . Arthritis Brother   . Diabetes Brother   . Heart disease Maternal Grandfather   . Heart disease Paternal Grandfather     Social History Social History   Tobacco Use  . Smoking status: Current Every Day Smoker    Packs/day: 0.50    Years: 50.00    Pack years: 25.00    Types: Cigarettes  . Smokeless tobacco: Current User  Substance Use Topics  . Alcohol use: No  . Drug use: No    Review of Systems  Constitutional: No fever/chills Eyes: No visual changes. ENT: No sore throat. Cardiovascular: Above respiratory: As above Gastrointestinal: No abdominal pain.  No nausea, no vomiting.  No diarrhea.  No constipation. Genitourinary: Negative for dysuria. Musculoskeletal: Negative for back pain. Skin: Negative for rash. Neurological: Negative for headaches, focal weakness or numbness.   ____________________________________________   PHYSICAL EXAM:  VITAL SIGNS: ED Triage Vitals  Enc Vitals Group     BP      Pulse      Resp      Temp      Temp src      SpO2      Weight      Height      Head Circumference      Peak Flow      Pain  Score      Pain Loc      Pain Edu?      Excl. in [place]?     Constitutional: Alert and oriented.  in no acute distress. Eyes: Conjunctivae are pale.  Head: Atraumatic. Nose: No congestion/rhinnorhea. Mouth/Throat: Mucous membranes are moist.  Neck: No stridor.   Cardiovascular: Normal rate, regular rhythm. Grossly normal heart sounds.  Good peripheral circulation. Respiratory: Increased work of breathing but able to speak in full sentences.  Severely decreased air movement throughout with a prolonged respiratory phase and distant wheezes in all fields. Gastrointestinal: Soft and nontender. No distention.   Rectal exam with several non-engorged hemorrhoids externally.  Digital exam with brown stool on the glove which is moderately heme positive.  Musculoskeletal: No lower extremity tenderness nor edema.  No joint effusions. Neurologic:  Normal speech and language. No gross focal neurologic deficits are appreciated. Skin:  Skin is warm, dry and intact. No rash noted. Psychiatric: Mood and affect are normal. Speech and behavior are normal.  ____________________________________________   LABS (all labs ordered are listed, but only abnormal results are displayed)  Labs Reviewed  CBC WITH DIFFERENTIAL/PLATELET - Abnormal; Notable for the following components:      Result Value   RBC 3.26 (*)    Hemoglobin 7.1 (*)    HCT 23.0 (*)    MCV 70.6 (*)    MCH 21.8 (*)    MCHC 30.9 (*)    RDW 19.9 (*)    All other components within normal limits  BASIC METABOLIC PANEL - Abnormal; Notable for the following components:   Glucose, Bld 202 (*)    Calcium 8.3 (*)    All other components within normal limits  TROPONIN I   ____________________________________________  EKG  ED ECG REPORT I, Doran Stabler, the attending physician, personally viewed and interpreted this ECG.   Date: 02/18/2017  EKG Time: 1159  Rate: 96  Rhythm: normal sinus rhythm  Axis: Normal  Intervals:none  ST&T  Change: No ST segment elevation or depression.  No abnormal T wave inversion.  ____________________________________________  RADIOLOGY  No edema or consolidation on the chest x-ray. ____________________________________________   PROCEDURES  Procedure(s) performed:   .Critical Care Performed by: Orbie Pyo, MD Authorized by: Orbie Pyo, MD   Critical care provider statement:    Critical care time (minutes):  35   Critical care was necessary to treat or prevent imminent or life-threatening deterioration of the following conditions:  Respiratory failure   Critical care was time spent personally by me on the following activities:  Examination of patient, ordering and performing treatments and interventions, ordering and review of laboratory studies, ordering and review of radiographic studies and re-evaluation of patient's condition    Critical Care performed:   ____________________________________________   INITIAL IMPRESSION / ASSESSMENT AND PLAN / ED COURSE  Pertinent labs & imaging results that were available during my care of the patient were reviewed by me and considered in my medical decision making (see chart for details).  Differential includes, but is not limited to, viral syndrome, bronchitis including COPD exacerbation, pneumonia, reactive airway disease including asthma, CHF including exacerbation with or without pulmonary/interstitial edema, pneumothorax, ACS, thoracic trauma, and pulmonary embolism. As part of my medical decision making, I reviewed the following data within the electronic MEDICAL RECORD NUMBER Notes from prior ED visits  ----------------------------------------- 12:57 PM on 02/18/2017 -----------------------------------------  Patient with persistent wheezing despite breathing treatments and steroids.  Also found to be anemic and heme positive on Eliquis.  Patient to receive blood transfusion.  Will be admitted to the  hospital.  Signed out to Dr. Jerelyn Charles.      ____________________________________________   FINAL CLINICAL IMPRESSION(S) / ED DIAGNOSES  COPD exacerbation.  Symptomatic anemia.    NEW MEDICATIONS STARTED DURING THIS VISIT:  New Prescriptions   No medications on file     Note:  This document was prepared using Dragon voice recognition software and may include unintentional dictation errors.     Orbie Pyo, MD 02/18/17 [phone]

## 2017-02-18 NOTE — ED Triage Notes (Signed)
Pt c/o increased SOB for the past 3-4 days with a hx of COPD. EMS gave duoneb x2 in route. Pt has noted audible wheezing on arrival. Pt is able to speak in complete sentences.

## 2017-02-18 NOTE — ED Notes (Signed)
Report off to [facility].

## 2017-02-18 NOTE — ED Notes (Signed)
Blood bank called and relayed blood is ready. Per MD [place] blood is not emergent and can be started upstairs

## 2017-02-18 NOTE — ED Notes (Signed)
Resumed care from amber rn.  Pt alert.  Pt waiting for admission bed.  Iv in place.  nsr on monitor.  Skin warm and dry,.  ua to lab.

## 2017-02-18 NOTE — H&P (Signed)
[facility] NAME: Samantha Aguirre    MR#:  [id number]  DATE OF BIRTH:  April 12, 1945  DATE OF ADMISSION:  02/18/2017  PRIMARY CARE PHYSICIAN: Olin Hauser, DO   REQUESTING/REFERRING PHYSICIAN:   CHIEF COMPLAINT:   Chief Complaint  Patient presents with  . Shortness of Breath    HISTORY OF PRESENT ILLNESS: Samantha Aguirre  is a 72 y.o. female with a known history per below presenting to the emergency room with 3-4-day history of worsening shortness of breath, lightheadedness, dizziness, in the emergency room patient was found to have anemia of 7.1-down from 11 in October, patient was guaiac positive, patient evaluated in the emergency room, daughter at the bedside, patient is now being admitted for acute COPD exacerbation, acute GI bleeding with symptomatic anemia.  PAST MEDICAL HISTORY:   Past Medical History:  Diagnosis Date  . Acid reflux   . Arthritis    Bilateral knees, hands, feet  . Asthma   . Atrial fibrillation ([place])   . Cancer ([place])   . Chest pain   . CHF (congestive heart failure) (HCC)    Diastolic CHF  . COPD (chronic obstructive pulmonary disease) ([place])   . Coronary artery disease   . Diabetes mellitus without complication ([place])   . Frequent headaches   . Heart attack ([place])   . High cholesterol   . HLD (hyperlipidemia)   . Hypertension   . MI (myocardial infarction) ([place])   . Seizures ([place])   . Skin cancer 2015   Suspected basal cell carcinoma on nose, surgically removed  . Stroke ([place])   . Thyroid disease   . Tremors of nervous system     PAST SURGICAL HISTORY:  Past Surgical History:  Procedure Laterality Date  . ABDOMINAL HYSTERECTOMY  1976  . APPENDECTOMY  1980  . CAROTID ENDARTERECTOMY    . CHOLECYSTECTOMY  1980  . [place]  . KNEE SURGERY     left  . ROTATOR CUFF REPAIR     right  . TONSILLECTOMY  1950    SOCIAL HISTORY:  Social History   Tobacco Use  .  Smoking status: Current Every Day Smoker    Packs/day: 0.50    Years: 50.00    Pack years: 25.00    Types: Cigarettes  . Smokeless tobacco: Current User  Substance Use Topics  . Alcohol use: No    FAMILY HISTORY:  Family History  Problem Relation Age of Onset  . Breast cancer Mother   . Heart disease Mother   . Stroke Mother   . Cancer Mother   . COPD Mother   . Heart disease Father   . Diabetes Father   . Stroke Father   . Alcohol abuse Sister   . Drug abuse Sister   . Stroke Sister   . Cancer Sister   . Mental illness Sister   . Heart disease Brother   . Arthritis Brother   . Diabetes Brother   . Heart disease Maternal Grandfather   . Heart disease Paternal Grandfather     DRUG ALLERGIES:  Allergies  Allergen Reactions  . Mushroom Extract Complex Anaphylaxis    Made patient "deathly sick"  . Atenolol     "MD took me off of it bc it was doing something wrong" Made patient HYPOTENSIVE  . Ivp Dye [Iodinated Diagnostic Agents] Itching    Pt injected with IV contrast.  5 min after injection pt complained of  itching behind ear and on her abd.  . Morphine And Related Other (See Comments)    "stops my breathing" NEAR-RESPIRATORY ARREST  . Percocet [Oxycodone-Acetaminophen] Nausea And Vomiting and Other (See Comments)    Stomach pains  . Percodan [Oxycodone-Aspirin] Nausea And Vomiting and Other (See Comments)    Stomach pains  . Verapamil Other (See Comments)    " MD told me this was screwing me up" MAKES PATIENT HYPOTENSIVE    REVIEW OF SYSTEMS:   CONSTITUTIONAL: No fever, +fatigue Arneta Cliche.  EYES: No blurred or double vision.  EARS, NOSE, AND THROAT: No tinnitus or ear pain.  RESPIRATORY: No cough,+ shortness of breath, wheezing no hemoptysis.  CARDIOVASCULAR: No chest pain, orthopnea, edema.  GASTROINTESTINAL: No nausea, vomiting, diarrhea or abdominal pain.  GENITOURINARY: No dysuria, hematuria.  ENDOCRINE: No polyuria, nocturia,  HEMATOLOGY: No anemia,  easy bruising or bleeding SKIN: No rash or lesion. MUSCULOSKELETAL: No joint pain or arthritis.   NEUROLOGIC: No tingling, numbness, weakness.  PSYCHIATRY: No anxiety or depression.   MEDICATIONS AT HOME:  Prior to Admission medications   Medication Sig Start Date End Date Taking? Authorizing Provider  acetaminophen (TYLENOL) 325 MG tablet Take 650 mg by mouth 2 (two) times daily.    Yes [provider]  albuterol (PROVENTIL HFA;VENTOLIN HFA) 108 (90 Base) MCG/ACT inhaler Inhale 2 puffs into the lungs every 6 (six) hours as needed for wheezing or shortness of breath. 10/08/16  Yes Karamalegos, Devonne Doughty, DO  apixaban (ELIQUIS) 5 MG TABS tablet Take 5 mg by mouth every 12 (twelve) hours.   Yes [provider]  atorvastatin (LIPITOR) 80 MG tablet Take 1 tablet (80 mg total) by mouth daily at 6 PM. 09/24/16  Yes Karamalegos, Devonne Doughty, DO  diclofenac sodium (VOLTAREN) 1 % GEL Apply 2 g topically 4 (four) times daily. 01/11/16  Yes Daymon Larsen, MD  DULoxetine (CYMBALTA) 60 MG capsule Take 1 capsule (60 mg total) by mouth at bedtime. 09/24/16  Yes Karamalegos, Devonne Doughty, DO  fluticasone (FLONASE) 50 MCG/ACT nasal spray Place 1 spray into both nostrils daily. 10/08/16  Yes Karamalegos, Alexander J, DO  fluticasone furoate-vilanterol (BREO ELLIPTA) 100-25 MCG/INH AEPB Inhale 1 puff into the lungs daily. Patient taking differently: Inhale 1 puff into the lungs daily as needed. For shortness of breath 11/13/15  Yes Karamalegos, Devonne Doughty, DO  furosemide (LASIX) 20 MG tablet Take 10 mg by mouth daily.    Yes Karamalegos, Devonne Doughty, DO  gabapentin (NEURONTIN) 800 MG tablet Take 1 tablet (800 mg total) by mouth 2 (two) times daily. 09/24/16  Yes Karamalegos, Devonne Doughty, DO  glipiZIDE-metformin (METAGLIP) 5-500 MG tablet Take by mouth 2 (two) times daily. 2 tablets every morning and 1 tablet at bedtime    Yes [provider]  levETIRAcetam (KEPPRA) 500 MG tablet Take 1  tablet (500 mg total) by mouth 2 (two) times daily. 09/19/15  Yes Epifanio Lesches, MD  levothyroxine (SYNTHROID, LEVOTHROID) 175 MCG tablet Take 1 tablet (175 mcg total) by mouth daily before breakfast. 09/24/16  Yes Karamalegos, Devonne Doughty, DO  nitroGLYCERIN (NITROSTAT) 0.4 MG SL tablet Place 1 tablet (0.4 mg total) under the tongue every 5 (five) minutes as needed for chest pain. 02/28/16  Yes Mody, Ulice Bold, MD  omeprazole (PRILOSEC) 20 MG capsule Take 1 capsule (20 mg total) by mouth daily. 10/16/16  Yes Karamalegos, Devonne Doughty, DO  potassium chloride (MICRO-K) 10 MEQ CR capsule Take 1 capsule (10 mEq total) by mouth daily. 09/24/16  Yes Karamalegos, Devonne Doughty, DO  sucralfate (CARAFATE) 1 g tablet Take 1 tablet by mouth 4 (four) times daily. 02/03/17  Yes [provider]  SUMAtriptan (IMITREX) 25 MG tablet Take 1 tablet (25 mg total) by mouth once as needed for up to 1 dose for migraine. May repeat one dose in 2 hours if persists, max in 24 hrs 02/13/17  Yes Karamalegos, Devonne Doughty, DO  umeclidinium bromide (INCRUSE ELLIPTA) 62.5 MCG/INH AEPB Inhale 1 puff into the lungs daily. 11/13/15  Yes Karamalegos, Devonne Doughty, DO  baclofen (LIORESAL) 10 MG tablet Take 0.5-1 tablets (5-10 mg total) by mouth 3 (three) times daily as needed for muscle spasms. Patient not taking: Reported on 02/18/2017 09/24/16   Olin Hauser, DO  lisinopril (PRINIVIL,ZESTRIL) 20 MG tablet Take 1 tablet (20 mg total) by mouth daily. Patient not taking: Reported on 02/18/2017 10/08/16   Olin Hauser, DO      PHYSICAL EXAMINATION:   VITAL SIGNS: Blood pressure 136/61, pulse 96, temperature 98.1 F (36.7 C), temperature source Oral, resp. rate 20, height 5\' 1"  (1.549 m), weight 81.6 kg (180 lb), SpO2 100 %.  GENERAL:  72 y.o.-year-old patient lying in the bed with no acute distress.  EYES: Pupils equal, round, reactive to light and accommodation. No scleral icterus. Extraocular muscles intact.   HEENT: Head atraumatic, normocephalic. Oropharynx and nasopharynx clear.  NECK: Bilateral inspiratory/expiratory wheezing with diminished breath sounds   LUNGS: No use of accessory muscles of respiration.  CARDIOVASCULAR: S1, S2 normal. No murmurs, rubs, or gallops.  ABDOMEN: Soft, nontender, nondistended. Bowel sounds present. No organomegaly or mass.  EXTREMITIES: No pedal edema, cyanosis, or clubbing.  NEUROLOGIC: Cranial nerves II through XII are intact. Muscle strength 5/5 in all extremities. Sensation intact. Gait not checked.  PSYCHIATRIC: The patient is alert and oriented x 3.  SKIN: No obvious rash, lesion, or ulcer.   LABORATORY PANEL:   CBC Recent Labs  Lab 02/18/17 1141  WBC 7.8  HGB 7.1*  HCT 23.0*  PLT 266  MCV 70.6*  MCH 21.8*  MCHC 30.9*  RDW 19.9*  LYMPHSABS 2.4  MONOABS 0.7  EOSABS 0.1  BASOSABS 0.1   ------------------------------------------------------------------------------------------------------------------  Chemistries  Recent Labs  Lab 02/18/17 1141  NA 137  K 3.6  CL 105  CO2 22  GLUCOSE 202*  BUN 9  CREATININE 0.66  CALCIUM 8.3*   ------------------------------------------------------------------------------------------------------------------ estimated creatinine clearance is 61.5 mL/min (by C-G formula based on SCr of 0.66 mg/dL). ------------------------------------------------------------------------------------------------------------------ No results for input(s): TSH, T4TOTAL, T3FREE, THYROIDAB in the last 72 hours.  Invalid input(s): FREET3   Coagulation profile No results for input(s): INR, PROTIME in the last 168 hours. ------------------------------------------------------------------------------------------------------------------- No results for input(s): DDIMER in the last 72 hours. -------------------------------------------------------------------------------------------------------------------  Cardiac  Enzymes Recent Labs  Lab 02/18/17 1141  TROPONINI <0.03   ------------------------------------------------------------------------------------------------------------------ Invalid input(s): POCBNP  ---------------------------------------------------------------------------------------------------------------  Urinalysis    Component Value Date/Time   COLORURINE YELLOW (A) 10/03/2016 1611   APPEARANCEUR CLEAR (A) 10/03/2016 1611   LABSPEC 1.020 10/03/2016 1611   PHURINE 6.0 10/03/2016 1611   GLUCOSEU NEGATIVE 10/03/2016 1611   HGBUR NEGATIVE 10/03/2016 1611   BILIRUBINUR NEGATIVE 10/03/2016 1611   KETONESUR NEGATIVE 10/03/2016 1611   PROTEINUR NEGATIVE 10/03/2016 1611   NITRITE NEGATIVE 10/03/2016 1611   LEUKOCYTESUR NEGATIVE 10/03/2016 1611     RADIOLOGY: Dg Chest 1 View  Result Date: 02/18/2017 CLINICAL DATA:  Shortness of Breath EXAM: CHEST 1 VIEW COMPARISON:  October 20, 2016 FINDINGS: There is no  edema or consolidation. Heart is upper normal in size with pulmonary vascularity within normal limits. No adenopathy. There is aortic atherosclerosis. There is degenerative change in the thoracic spine. IMPRESSION: No edema or consolidation. Stable cardiac silhouette. There is aortic atherosclerosis. Aortic Atherosclerosis (ICD10-I70.0). Electronically Signed   By: Lowella Grip III M.D.   On: 02/18/2017 12:03    EKG: Orders placed or performed during the hospital encounter of 02/18/17  . EKG 12-Lead  . EKG 12-Lead    IMPRESSION AND PLAN: 1 acute on chronic COPD exacerbation Admit to regular nursing floor bed, IV Solu-Medrol with tapering as tolerated, inhaled corticosteroids twice daily, aggressive pulmonary toilet with bronchodilator therapy, mucolytic agents, supplemental oxygen as needed, respiratory therapy to see, strongly encouraged quitting smoking, nicotine patch daily, cessation counseling ordered, continue close medical monitoring  2 acute GI bleeding with  symptomatic microcytic anemia Guaiac positive, compounded by Eliquis Discontinue Eliquis for now, Protonix drip, gastroenterology to see, transfuse 2 units packed red blood cells, H&H every 8 hours, CBC daily, transfuse as needed, avoid anticoagulants/NSAID medications/anticoagulants for now  3 acute microcytic anemia Check anemia workup and treat as indicated All other plans as stated above  4 chronic atrial fibrillation Eliquis on hold given acute GI bleeding Currently not on AV nodal blocking agents  5 chronic hypothyroidism, unspecified Continue Synthroid and check TSH level  6 chronic morbid obesity Most likely secondary to excess calories Lifestyle modification recommended  7 chronic tobacco smoking abuse/dependency Nicotine patch and cessation counseling ordered  All the records are reviewed and case discussed with ED provider. Management plans discussed with the patient, family and they are in agreement.  CODE STATUS: Code Status History    Date Active Date Inactive Code Status Order ID Comments User Context   10/03/2016 18:41 10/04/2016 17:39 Full Code [id number]  Demetrios Loll, MD Inpatient   02/27/2016 17:44 02/28/2016 20:34 Full Code [id number]  Vaughan Basta, MD Inpatient   02/19/2016 15:53 02/21/2016 20:41 Full Code [id number]  Gladstone Lighter, MD Inpatient   12/27/2015 13:09 01/01/2016 18:33 DNR [id number]  Loletha Grayer, MD ED   09/16/2015 18:52 09/19/2015 19:29 Full Code [id number]  Baxter Hire, MD Inpatient   08/16/2015 17:14 08/19/2015 17:35 DNR [id number]  Willia Craze, NP Inpatient       TOTAL TIME TAKING CARE OF THIS PATIENT: 45 minutes.    Avel Peace Ousmane Seeman M.D on 02/18/2017   Between 7am to 6pm - Pager - [phone]  After 6pm go to [email or web address] - password [email or web address] [facility]  Office  [phone]  CC: Primary care physician; Olin Hauser, DO   Note: This dictation was prepared with Dragon dictation along with  smaller phrase technology. Any transcriptional errors that result from this process are unintentional.

## 2017-02-19 LAB — CBC
HEMATOCRIT: 30.1 % — AB (ref 35.0–47.0)
HEMOGLOBIN: 9.4 g/dL — AB (ref 12.0–16.0)
MCH: 22.7 pg — AB (ref 26.0–34.0)
MCHC: 31.3 g/dL — AB (ref 32.0–36.0)
MCV: 72.5 fL — ABNORMAL LOW (ref 80.0–100.0)
Platelets: 271 10*3/uL (ref 150–440)
RBC: 4.15 MIL/uL (ref 3.80–5.20)
RDW: 19.9 % — ABNORMAL HIGH (ref 11.5–14.5)
WBC: 14.6 10*3/uL — ABNORMAL HIGH (ref 3.6–11.0)

## 2017-02-19 LAB — BASIC METABOLIC PANEL
ANION GAP: 11 (ref 5–15)
BUN: 13 mg/dL (ref 6–20)
CALCIUM: 9.2 mg/dL (ref 8.9–10.3)
CO2: 22 mmol/L (ref 22–32)
Chloride: 106 mmol/L (ref 101–111)
Creatinine, Ser: 0.79 mg/dL (ref 0.44–1.00)
GFR calc Af Amer: >60 mL/min (ref >60)
GFR calc non Af Amer: >60 mL/min (ref >60)
GLUCOSE: 255 mg/dL — AB (ref 65–99)
Potassium: 3.1 mmol/L — ABNORMAL LOW (ref 3.5–5.1)
Sodium: 139 mmol/L (ref 135–145)

## 2017-02-19 LAB — GLUCOSE, CAPILLARY
GLUCOSE-CAPILLARY: 217 mg/dL — AB (ref 65–99)
Glucose-Capillary: 221 mg/dL — ABNORMAL HIGH (ref 65–99)
Glucose-Capillary: 292 mg/dL — ABNORMAL HIGH (ref 65–99)
Glucose-Capillary: 298 mg/dL — ABNORMAL HIGH (ref 65–99)

## 2017-02-19 LAB — HEMOGLOBIN AND HEMATOCRIT, BLOOD
HCT: 29 % — ABNORMAL LOW (ref 35.0–47.0)
HCT: 25 % — ABNORMAL LOW (ref 35.0–47.0)
HEMATOCRIT: 29.3 % — AB (ref 35.0–47.0)
HEMOGLOBIN: 7.8 g/dL — AB (ref 12.0–16.0)
Hemoglobin: 9.3 g/dL — ABNORMAL LOW (ref 12.0–16.0)
Hemoglobin: 9.1 g/dL — ABNORMAL LOW (ref 12.0–16.0)

## 2017-02-19 LAB — PREPARE RBC (CROSSMATCH)

## 2017-02-19 LAB — VITAMIN B12: VITAMIN B 12: 182 pg/mL (ref 180–914)

## 2017-02-19 MED ORDER — SODIUM CHLORIDE 0.9 % IV SOLN
300.0000 mg | Freq: Once | INTRAVENOUS | Status: AC
  Administered 2017-02-19: 20:00:00 300 mg via INTRAVENOUS
  Filled 2017-02-19: qty 15

## 2017-02-19 MED ORDER — METHYLPREDNISOLONE SODIUM SUCC 125 MG IJ SOLR
60.0000 mg | Freq: Every day | INTRAMUSCULAR | Status: DC
  Administered 2017-02-20 – 2017-02-21 (×2): 60 mg via INTRAVENOUS
  Filled 2017-02-19 (×2): qty 2

## 2017-02-19 MED ORDER — POTASSIUM CHLORIDE CRYS ER 20 MEQ PO TBCR
40.0000 meq | EXTENDED_RELEASE_TABLET | Freq: Once | ORAL | Status: AC
  Administered 2017-02-19: 20:00:00 40 meq via ORAL
  Filled 2017-02-19: qty 2

## 2017-02-19 MED ORDER — PEG 3350-KCL-NA BICARB-NACL 420 G PO SOLR
4000.0000 mL | Freq: Once | ORAL | Status: AC
  Administered 2017-02-19: 23:00:00 4000 mL via ORAL
  Filled 2017-02-19 (×2): qty 4000

## 2017-02-19 MED ORDER — CYANOCOBALAMIN 1000 MCG/ML IJ SOLN
1000.0000 ug | Freq: Every day | INTRAMUSCULAR | Status: DC
  Administered 2017-02-19 – 2017-02-21 (×3): 1000 ug via INTRAMUSCULAR
  Filled 2017-02-19 (×3): qty 1

## 2017-02-19 NOTE — Plan of Care (Signed)
  Progressing Education: Knowledge of General Education information will improve 02/19/2017 2036 - Progressing by Jeri Cos, RN Clinical Measurements: Respiratory complications will improve 02/19/2017 2036 - Progressing by Jeri Cos, RN Activity: Risk for activity intolerance will decrease 02/19/2017 2036 - Progressing by Jeri Cos, RN Respiratory: Ability to maintain a clear airway will improve 02/19/2017 2036 - Progressing by Jeri Cos, RN

## 2017-02-19 NOTE — Progress Notes (Signed)
Inpatient Diabetes Program Recommendations  AACE/ADA: New Consensus Statement on Inpatient Glycemic Control (2015)  Target Ranges:  Prepandial:   less than 140 mg/dL      Peak postprandial:   less than 180 mg/dL (1-2 hours)      Critically ill patients:  140 - 180 mg/dL   Lab Results  Component Value Date   GLUCAP 217 (H) 02/19/2017   HGBA1C 6.0 (H) 10/04/2016    Review of Glycemic Control  Results for Samantha Aguirre, Samantha Aguirre (MRN [id number]) as of 02/19/2017 11:12  Ref. Range 02/18/2017 21:59 02/18/2017 22:42 02/19/2017 07:41  Glucose-Capillary Latest Ref Range: 65 - 99 mg/dL 280 (H) 298 (H) 217 (H)    Diabetes history: Type 2  Outpatient Diabetes medications: Metaglip 5-500mg  2 tabs qam and 1 tab qpm  Current orders for Inpatient glycemic control: Novolog 0-20 units tid, Novolog 0-5 units qhs, Lantus 20 units qhs * steroids 60mg  q6h  Inpatient Diabetes Program Recommendations: Patient is NPO- please change Novolog to Novolog 0-20 units q4h  - d/c Novolog 0-5 units qhs and Novolog 0-20 units tid   Gentry Fitz, RN, [place], [place], CDE Diabetes Coordinator Inpatient Diabetes Program  [phone] (Team Pager) [phone] ([place]) 02/19/2017 11:18 AM

## 2017-02-19 NOTE — Progress Notes (Signed)
Samantha Darby, MD [address]  [place]  [place], [place] [address]  Main: [phone]  Fax: [phone] Pager: [phone]   Subjective: Reports feeling significantly better. Denies hematochezia or melena. Her respiratory status is also better. She is on clear liquid diet. She received IV iron and 2 units of PRBCs  Objective: Vital signs in last 24 hours: Vitals:   02/19/17 0142 02/19/17 0412 02/19/17 0826 02/19/17 1313  BP: 131/62 (!) 130/54 127/61 118/86  Pulse: 85 78 72 90  Resp: 20 18 18 20   Temp: 98.3 F (36.8 C) 98.1 F (36.7 C) 98.6 F (37 C) 97.8 F (36.6 C)  TempSrc: Oral Oral Oral Oral  SpO2: 95% 96% 95% 90%  Weight:      Height:       Weight change:   Intake/Output Summary (Last 24 hours) at 02/19/2017 1814 Last data filed at 02/19/2017 1300 Gross per 24 hour  Intake 1402 ml  Output -  Net 1402 ml     Exam: Heart:: Regular rate and rhythm or S1S2 present Lungs: clear to auscultation Abdomen: soft, nontender, normal bowel sounds   Lab Results: @LABTEST2 @ Micro Results: No results found for this or any previous visit (from the past 240 hour(s)). Studies/Results: Dg Chest 1 View  Result Date: 02/18/2017 CLINICAL DATA:  Shortness of Breath EXAM: CHEST 1 VIEW COMPARISON:  October 20, 2016 FINDINGS: There is no edema or consolidation. Heart is upper normal in size with pulmonary vascularity within normal limits. No adenopathy. There is aortic atherosclerosis. There is degenerative change in the thoracic spine. IMPRESSION: No edema or consolidation. Stable cardiac silhouette. There is aortic atherosclerosis. Aortic Atherosclerosis (ICD10-I70.0). Electronically Signed   By: Lowella Grip III M.D.   On: 02/18/2017 12:03   Medications: I have reviewed the patient's current medications. Scheduled Meds: . atorvastatin  80 mg Oral q1800  . cyanocobalamin  1,000 mcg Intramuscular Daily  . docusate sodium  100 mg Oral BID  . DULoxetine  60 mg  Oral QHS  . fluticasone  1 spray Each Nare Daily  . fluticasone furoate-vilanterol  1 puff Inhalation Daily  . furosemide  10 mg Oral Daily  . gabapentin  800 mg Oral BID  . insulin aspart  0-20 Units Subcutaneous TID WC  . insulin aspart  0-5 Units Subcutaneous QHS  . insulin glargine  20 Units Subcutaneous QHS  . ipratropium-albuterol  3 mL Nebulization QID  . levETIRAcetam  500 mg Oral BID  . levothyroxine  175 mcg Oral QAC breakfast  . lisinopril  20 mg Oral Daily  . [START ON 02/20/2017] methylPREDNISolone (SOLU-MEDROL) injection  60 mg Intravenous Daily  . nicotine  14 mg Transdermal Daily  . nicotine  14 mg Transdermal Daily  . [START ON 02/22/2017] pantoprazole  40 mg Intravenous Q12H  . polyethylene glycol  17 g Oral Daily  . potassium chloride  10 mEq Oral Daily  . potassium chloride  40 mEq Oral Once  . sucralfate  1 g Oral QID  . umeclidinium bromide  1 puff Inhalation Daily   Continuous Infusions: . iron sucrose     PRN Meds:.acetaminophen **OR** acetaminophen, baclofen, bisacodyl, diclofenac sodium, furosemide, HYDROcodone-acetaminophen, nitroGLYCERIN, ondansetron **OR** ondansetron (ZOFRAN) IV, senna-docusate, SUMAtriptan   Assessment: Active Problems:   COPD (chronic obstructive pulmonary disease) (HCC)  Severe symptomatic iron deficiency anemia, Eliquis is on hold  Plan: - Discussed with patient about EGD and colonoscopy and she is agreeable with the plan - Nothing by mouth  past midnight - Continue clear liquid diet - Bowel prep tonight - Administer Venofer as well as B12 IM injection  - EGD and colonoscopy tomorrow, if unremarkable recommend performing VCE - Continue to hold Eliquis  I have discussed alternative options, risks & benefits,  which include, but are not limited to, bleeding, infection, perforation,respiratory complication & drug reaction.  The patient agrees with this plan & written consent will be obtained.     LOS: 1 day   Samantha  Aguirre 02/19/2017, 6:14 PM

## 2017-02-19 NOTE — Progress Notes (Signed)
Patient ID: Samantha Aguirre, female   DOB: 19-Aug-1945, 72 y.o.   MRN: [id number]  [facility] PROGRESS NOTE  Samantha Aguirre SJG:[id number] DOB: 1945-07-24 DOA: 02/18/2017 PCP: Olin Hauser, DO  HPI/Subjective: Patient feels okay.  Denies seeing any bleeding anywhere.  States her breathing is better.  Asking for food.  Objective: Vitals:   02/19/17 0826 02/19/17 1313  BP: 127/61 118/86  Pulse: 72 90  Resp: 18 20  Temp: 98.6 F (37 C) 97.8 F (36.6 C)  SpO2: 95% 90%    Filed Weights   02/18/17 1136 02/18/17 1622  Weight: 81.6 kg (180 lb) 85.9 kg (189 lb 6.4 oz)    ROS: Review of Systems  Constitutional: Negative for chills and fever.  Eyes: Negative for blurred vision.  Respiratory: Positive for cough, shortness of breath and wheezing.   Cardiovascular: Negative for chest pain.  Gastrointestinal: Negative for abdominal pain, constipation, diarrhea, nausea and vomiting.  Genitourinary: Negative for dysuria.  Musculoskeletal: Negative for joint pain.  Neurological: Negative for dizziness and headaches.   Exam: Physical Exam  HENT:  Nose: No mucosal edema.  Mouth/Throat: No oropharyngeal exudate or posterior oropharyngeal edema.  Eyes: Conjunctivae, EOM and lids are normal. Pupils are equal, round, and reactive to light.  Neck: No JVD present. Carotid bruit is not present. No edema present. No thyroid mass and no thyromegaly present.  Cardiovascular: S1 normal and S2 normal. Exam reveals no gallop.  No murmur heard. Pulses:      Dorsalis pedis pulses are 2+ on the right side, and 2+ on the left side.  Respiratory: No respiratory distress. She has decreased breath sounds in the right lower field, the left middle field and the left lower field. She has wheezes in the right middle field, the left middle field and the left lower field. She has no rhonchi. She has no rales.  GI: Soft. Bowel sounds are normal. There is no tenderness.  Musculoskeletal:        Right ankle: She exhibits swelling.       Left ankle: She exhibits swelling.  Lymphadenopathy:    She has no cervical adenopathy.  Neurological: She is alert. No cranial nerve deficit.  Skin: Skin is warm. No rash noted. Nails show no clubbing.  Psychiatric: She has a normal mood and affect.      Data Reviewed: Basic Metabolic Panel: Recent Labs  Lab 02/18/17 1141 02/19/17 1223 02/19/17 1307  NA 137 TEST WILL BE CREDITED 139  K 3.6 TEST WILL BE CREDITED 3.1*  CL 105 TEST WILL BE CREDITED 106  CO2 22 TEST WILL BE CREDITED 22  GLUCOSE 202* TEST WILL BE CREDITED 255*  BUN 9 TEST WILL BE CREDITED 13  CREATININE 0.66 TEST WILL BE CREDITED 0.79  CALCIUM 8.3* TEST WILL BE CREDITED 9.2   CBC: Recent Labs  Lab 02/18/17 1141  02/19/17 0023 02/19/17 0527 02/19/17 0939 02/19/17 1223 02/19/17 1307  WBC 7.8  --   --   --   --  TEST WILL BE CREDITED 14.6*  NEUTROABS 4.5  --   --   --   --   --   --   HGB 7.1*   < > 7.8* 9.3* 9.1* TEST WILL BE CREDITED 9.4*  HCT 23.0*   < > 25.0* 29.0* 29.3* TEST WILL BE CREDITED 30.1*  MCV 70.6*  --   --   --   --  TEST WILL BE CREDITED 72.5*  PLT 266  --   --   --   --  TEST WILL BE CREDITED 271   < > = values in this interval not displayed.   Cardiac Enzymes: Recent Labs  Lab 02/18/17 1141  TROPONINI <0.03   BNP (last 3 results) Recent Labs    10/03/16 1436  BNP 36.0     CBG: Recent Labs  Lab 02/18/17 2159 02/18/17 2242 02/19/17 0741 02/19/17 1155  GLUCAP 280* 298* 217* 221*     Studies: Dg Chest 1 View  Result Date: 02/18/2017 CLINICAL DATA:  Shortness of Breath EXAM: CHEST 1 VIEW COMPARISON:  October 20, 2016 FINDINGS: There is no edema or consolidation. Heart is upper normal in size with pulmonary vascularity within normal limits. No adenopathy. There is aortic atherosclerosis. There is degenerative change in the thoracic spine. IMPRESSION: No edema or consolidation. Stable cardiac silhouette. There is aortic  atherosclerosis. Aortic Atherosclerosis (ICD10-I70.0). Electronically Signed   By: Lowella Grip III M.D.   On: 02/18/2017 12:03    Scheduled Meds: . atorvastatin  80 mg Oral q1800  . diphenhydrAMINE  25 mg Oral Once  . docusate sodium  100 mg Oral BID  . DULoxetine  60 mg Oral QHS  . fluticasone  1 spray Each Nare Daily  . fluticasone furoate-vilanterol  1 puff Inhalation Daily  . furosemide  10 mg Oral Daily  . gabapentin  800 mg Oral BID  . insulin aspart  0-20 Units Subcutaneous TID WC  . insulin aspart  0-5 Units Subcutaneous QHS  . insulin glargine  20 Units Subcutaneous QHS  . ipratropium-albuterol  3 mL Nebulization QID  . levETIRAcetam  500 mg Oral BID  . levothyroxine  175 mcg Oral QAC breakfast  . lisinopril  20 mg Oral Daily  . [START ON 02/20/2017] methylPREDNISolone (SOLU-MEDROL) injection  60 mg Intravenous Daily  . nicotine  14 mg Transdermal Daily  . nicotine  14 mg Transdermal Daily  . [START ON 02/22/2017] pantoprazole  40 mg Intravenous Q12H  . polyethylene glycol  17 g Oral Daily  . potassium chloride  10 mEq Oral Daily  . sucralfate  1 g Oral QID  . umeclidinium bromide  1 puff Inhalation Daily   Continuous Infusions: . pantoprozole (PROTONIX) infusion 8 mg/hr (02/19/17 0829)    Assessment/Plan:   1. COPD exacerbation.  Drop Solu-Medrol dose down to 60 mg IV daily.  Continue DuoNeb and usual inhalers. 2. Iron deficiency anemia, GI bleed.  Patient was given IV iron.  Patient states that she is not bleeding.  Eliquis on hold.  Patient placed on Protonix drip.  GI wants to hold off on procedures at this time. 3. Chronic atrial fibrillation Eliquis on hold. 4. Hypothyroidism unspecified on levothyroxine 5. Morbid obesity.  Weight loss needed 6. Tobacco abuse on nicotine patch 7. Hyperlipidemia unspecified on atorvastatin 8. Type 2 diabetes mellitus.  On Lantus and sliding scale.  Decreasing dose of steroid should help. 9. History of seizure on  Keppra 10. History of coronary artery disease and diastolic congestive heart failure.  No signs of heart failure at this time.  Code Status:     Code Status Orders  (From admission, onward)        Start     Ordered   02/18/17 1624  Full code  Continuous     02/18/17 1623    Code Status History    Date Active Date Inactive Code Status Order ID Comments User Context   10/03/2016 18:41 10/04/2016 17:39 Full Code [id number]  Demetrios Loll, MD Inpatient   02/27/2016 17:44  02/28/2016 20:34 Full Code [id number]  Vaughan Basta, MD Inpatient   02/19/2016 15:53 02/21/2016 20:41 Full Code [id number]  Gladstone Lighter, MD Inpatient   12/27/2015 13:09 01/01/2016 18:33 DNR [id number]  Loletha Grayer, MD ED   09/16/2015 18:52 09/19/2015 19:29 Full Code [id number]  Baxter Hire, MD Inpatient   08/16/2015 17:14 08/19/2015 17:35 DNR [id number]  Willia Craze, NP Inpatient     Disposition Plan: To be determined  Consultants:  Gastroenterology  Time spent: 28 minutes  [place]

## 2017-02-19 NOTE — Plan of Care (Signed)
VSS, free of falls during shift.  Denies pain, no complaints overnight.  Received 2u PRBC transfusion, tolerated well, no s/s reaction.  Awaiting lab draw for H&H recheck.  Bed in low position, call bell within reach.  WCTM.

## 2017-02-20 ENCOUNTER — Encounter
Admission: EM | Disposition: A | Discharge status: Home / Self Care | Payer: Self-pay | Source: Home / Self Care | Attending: Internal Medicine

## 2017-02-20 ENCOUNTER — Inpatient Hospital Stay: Payer: Medicare Other | Admitting: Anesthesiology

## 2017-02-20 ENCOUNTER — Encounter: Payer: Self-pay | Admitting: Anesthesiology

## 2017-02-20 DIAGNOSIS — D122 Benign neoplasm of ascending colon: Secondary | ICD-10-CM

## 2017-02-20 DIAGNOSIS — D500 Iron deficiency anemia secondary to blood loss (chronic): Secondary | ICD-10-CM

## 2017-02-20 DIAGNOSIS — D123 Benign neoplasm of transverse colon: Secondary | ICD-10-CM

## 2017-02-20 DIAGNOSIS — D120 Benign neoplasm of cecum: Secondary | ICD-10-CM

## 2017-02-20 HISTORY — PX: COLONOSCOPY: SHX5424

## 2017-02-20 HISTORY — PX: ESOPHAGOGASTRODUODENOSCOPY: SHX5428

## 2017-02-20 LAB — GLUCOSE, CAPILLARY
GLUCOSE-CAPILLARY: 177 mg/dL — AB (ref 65–99)
Glucose-Capillary: 130 mg/dL — ABNORMAL HIGH (ref 65–99)
Glucose-Capillary: 142 mg/dL — ABNORMAL HIGH (ref 65–99)
Glucose-Capillary: 167 mg/dL — ABNORMAL HIGH (ref 65–99)

## 2017-02-20 LAB — CBC
HEMATOCRIT: 28.8 % — AB (ref 35.0–47.0)
Hemoglobin: 8.7 g/dL — ABNORMAL LOW (ref 12.0–16.0)
MCH: 22 pg — AB (ref 26.0–34.0)
MCHC: 30.3 g/dL — AB (ref 32.0–36.0)
MCV: 72.6 fL — AB (ref 80.0–100.0)
Platelets: 276 10*3/uL (ref 150–440)
RBC: 3.97 MIL/uL (ref 3.80–5.20)
RDW: 20.3 % — AB (ref 11.5–14.5)
WBC: 19 10*3/uL — ABNORMAL HIGH (ref 3.6–11.0)

## 2017-02-20 LAB — BASIC METABOLIC PANEL
ANION GAP: 9 (ref 5–15)
BUN: 16 mg/dL (ref 6–20)
CALCIUM: 9 mg/dL (ref 8.9–10.3)
CO2: 24 mmol/L (ref 22–32)
CREATININE: 0.69 mg/dL (ref 0.44–1.00)
Chloride: 109 mmol/L (ref 101–111)
GFR calc Af Amer: >60 mL/min (ref >60)
GFR calc non Af Amer: >60 mL/min (ref >60)
GLUCOSE: 136 mg/dL — AB (ref 65–99)
Potassium: 4.3 mmol/L (ref 3.5–5.1)
Sodium: 142 mmol/L (ref 135–145)

## 2017-02-20 LAB — TYPE AND SCREEN
ABO/RH(D): O POS
ANTIBODY SCREEN: NEGATIVE
UNIT DIVISION: 0
Unit division: 0

## 2017-02-20 LAB — BPAM RBC
Blood Product Expiration Date: 201902172359
Blood Product Expiration Date: 201903112359
ISSUE DATE / TIME: 201902070106
ISSUE DATE / TIME: 201902061838
Unit Type and Rh: 5100
Unit Type and Rh: 5100

## 2017-02-20 SURGERY — EGD (ESOPHAGOGASTRODUODENOSCOPY)
Anesthesia: General

## 2017-02-20 MED ORDER — INSULIN GLARGINE 100 UNIT/ML ~~LOC~~ SOLN
8.0000 [IU] | Freq: Every day | SUBCUTANEOUS | Status: DC
  Administered 2017-02-20: 8 [IU] via SUBCUTANEOUS
  Filled 2017-02-20 (×2): qty 0.08

## 2017-02-20 MED ORDER — METOPROLOL TARTRATE 5 MG/5ML IV SOLN
INTRAVENOUS | Status: AC
  Filled 2017-02-20: qty 5

## 2017-02-20 MED ORDER — LIDOCAINE HCL (CARDIAC) 20 MG/ML IV SOLN
INTRAVENOUS | Status: DC | PRN
  Administered 2017-02-20: 60 mg via INTRAVENOUS

## 2017-02-20 MED ORDER — PROPOFOL 500 MG/50ML IV EMUL
INTRAVENOUS | Status: AC
  Filled 2017-02-20: qty 50

## 2017-02-20 MED ORDER — IPRATROPIUM-ALBUTEROL 0.5-2.5 (3) MG/3ML IN SOLN: 3.0000 mL | RESPIRATORY_TRACT | Status: DC | PRN

## 2017-02-20 MED ORDER — MIDAZOLAM HCL 2 MG/2ML IJ SOLN
INTRAMUSCULAR | Status: AC
  Filled 2017-02-20: qty 2

## 2017-02-20 MED ORDER — SODIUM CHLORIDE 0.9 % IV SOLN
INTRAVENOUS | Status: DC
  Administered 2017-02-20: 1000 mL via INTRAVENOUS

## 2017-02-20 MED ORDER — PROPOFOL 500 MG/50ML IV EMUL
INTRAVENOUS | Status: DC | PRN
  Administered 2017-02-20: 160 ug/kg/min via INTRAVENOUS

## 2017-02-20 MED ORDER — MIDAZOLAM HCL 2 MG/2ML IJ SOLN
INTRAMUSCULAR | Status: DC | PRN
  Administered 2017-02-20 (×2): 1 mg via INTRAVENOUS

## 2017-02-20 MED ORDER — SODIUM CHLORIDE 0.9 % IV SOLN: INTRAVENOUS | Status: DC

## 2017-02-20 MED ORDER — LIDOCAINE HCL (PF) 2 % IJ SOLN
INTRAMUSCULAR | Status: AC
  Filled 2017-02-20: qty 10

## 2017-02-20 MED ORDER — METOPROLOL TARTRATE 5 MG/5ML IV SOLN
INTRAVENOUS | Status: DC | PRN
  Administered 2017-02-20 (×2): 1 mg via INTRAVENOUS

## 2017-02-20 MED ORDER — PROPOFOL 10 MG/ML IV BOLUS
INTRAVENOUS | Status: DC | PRN
  Administered 2017-02-20: 40 mg via INTRAVENOUS
  Administered 2017-02-20: 20 mg via INTRAVENOUS

## 2017-02-20 NOTE — Anesthesia Preprocedure Evaluation (Addendum)
Anesthesia Evaluation  Patient identified by MRN, date of birth, ID band Patient awake    Reviewed: Allergy & Precautions, NPO status , Patient's Chart, lab work & pertinent test results, reviewed documented beta blocker date and time   Airway Mallampati: III  TM Distance: >3 FB     Dental  (+) Chipped, Partial Upper, Edentulous Lower   Pulmonary asthma , COPD, Current Smoker,           Cardiovascular hypertension, Pt. on medications + CAD, + Past MI, + Peripheral Vascular Disease and +CHF  + dysrhythmias Atrial Fibrillation      Neuro/Psych  Headaches, Seizures -,  PSYCHIATRIC DISORDERS Depression CVA, Residual Symptoms    GI/Hepatic GERD  ,  Endo/Other  diabetes, Type 2Hypothyroidism   Renal/GU      Musculoskeletal  (+) Arthritis ,   Abdominal   Peds  Hematology   Anesthesia Other Findings Low sats 90%. She walks without a cane.  Reproductive/Obstetrics                            Anesthesia Physical Anesthesia Plan  ASA: III  Anesthesia Plan: General   Post-op Pain Management:    Induction: Intravenous  PONV Risk Score and Plan:   Airway Management Planned:   Additional Equipment:   Intra-op Plan:   Post-operative Plan:   Informed Consent: I have reviewed the patients History and Physical, chart, labs and discussed the procedure including the risks, benefits and alternatives for the proposed anesthesia with the patient or authorized representative who has indicated his/her understanding and acceptance.     Plan Discussed with: CRNA  Anesthesia Plan Comments:         Anesthesia Quick Evaluation

## 2017-02-20 NOTE — Progress Notes (Signed)
EGD and colonoscopy post procedure note:  EGD normal Colonoscopy showed several polyps of varying size up to 1 cm found in entire colon which is probably the source of occult blood loss leading to iron deficiency anemia, polypectomy was performed Fair prep  Recs:  Hold eliquis for 3 days post polypectomy Resume diet Oral iron 325mg  BID  Colace daily Follow up in GI clinic in 2weeks Repeat colonoscopy in 1 year  GI will sign off, please call us back with questions  Cephas Darby, MD [place]  [place], [place] [address]  Main: [phone]  Fax: [phone] Pager: [phone]

## 2017-02-20 NOTE — Care Management Important Message (Signed)
Important Message  Patient Details  Name: Samantha Aguirre MRN: [id number] Date of Birth: [birth year]   Medicare Important Message Given:  Yes    Shelbie Ammons, RN 02/20/2017, 6:43 AM

## 2017-02-20 NOTE — Anesthesia Post-op Follow-up Note (Signed)
Anesthesia QCDR form completed.        

## 2017-02-20 NOTE — Op Note (Signed)
[facility] Gastroenterology Patient Name: Samantha Aguirre Procedure Date: 02/20/2017 4:36 PM MRN: [id number] Account #: [id number] Date of Birth: 1945/10/16 Admit Type: Inpatient Age: 72 Room: [facility] ENDO ROOM 4 Gender: Female Note Status: Finalized Procedure:            Upper GI endoscopy Indications:          Suspected upper gastrointestinal bleeding in patient                        with unexplained iron deficiency anemia Providers:            Lin Landsman MD, MD Medicines:            Monitored Anesthesia Care Complications:        No immediate complications. Estimated blood loss: None. Procedure:            Pre-Anesthesia Assessment:                       - Prior to the procedure, a History and Physical was                        performed, and patient medications and allergies were                        reviewed. The patient is competent. The risks and                        benefits of the procedure and the sedation options and                        risks were discussed with the patient. All questions                        were answered and informed consent was obtained.                        Patient identification and proposed procedure were                        verified by the physician, the nurse, the                        anesthesiologist, the anesthetist and the technician in                        the pre-procedure area in the procedure room in the                        endoscopy suite. Mental Status Examination: alert and                        oriented. Airway Examination: normal oropharyngeal                        airway and neck mobility. Respiratory Examination:                        clear to auscultation. CV Examination: normal.  Prophylactic Antibiotics: The patient does not require                        prophylactic antibiotics. Prior Anticoagulants: The                        patient has taken aspirin, last  dose was 1 day prior to                        procedure. ASA Grade Assessment: III - A patient with                        severe systemic disease. After reviewing the risks and                        benefits, the patient was deemed in satisfactory                        condition to undergo the procedure. The anesthesia plan                        was to use monitored anesthesia care (MAC). Immediately                        prior to administration of medications, the patient was                        re-assessed for adequacy to receive sedatives. The                        heart rate, respiratory rate, oxygen saturations, blood                        pressure, adequacy of pulmonary ventilation, and                        response to care were monitored throughout the                        procedure. The physical status of the patient was                        re-assessed after the procedure.                       After obtaining informed consent, the endoscope was                        passed under direct vision. Throughout the procedure,                        the patient's blood pressure, pulse, and oxygen                        saturations were monitored continuously. The Endoscope                        was introduced through the mouth, and advanced to the  third part of duodenum. The upper GI endoscopy was                        accomplished without difficulty. The patient tolerated                        the procedure well. Findings:      The duodenal bulb, second portion of the duodenum and third portion of       the duodenum were normal.      The entire examined stomach was normal.      The cardia and gastric fundus were normal on retroflexion.      The gastroesophageal junction and examined esophagus were normal. Impression:           - Normal duodenal bulb, second portion of the duodenum                        and third portion of the duodenum.                        - Normal stomach.                       - Normal gastroesophageal junction and esophagus.                       - No specimens collected. Recommendation:       - Proceed with colonoscopy as scheduled as no upper GI                        source of anemia is found                       - See colonoscopy report for further recs Procedure Code(s):    --- Professional ---                       [phone], Esophagogastroduodenoscopy, flexible, transoral;                        diagnostic, including collection of specimen(s) by                        brushing or washing, when performed (separate procedure) Diagnosis Code(s):    --- Professional ---                       D50.9, Iron deficiency anemia, unspecified CPT copyright 2016 American Medical Association. All rights reserved. The codes documented in this report are preliminary and upon coder review may  be revised to meet current compliance requirements. Dr. Ulyess Mort Lin Landsman MD, MD 02/20/2017 4:52:10 PM This report has been signed electronically. Number of Addenda: 0 Note Initiated On: 02/20/2017 4:36 PM      [facility]

## 2017-02-20 NOTE — Op Note (Signed)
[facility] Gastroenterology Patient Name: Samantha Aguirre Procedure Date: 02/20/2017 4:34 PM MRN: [id number] Account #: [id number] Date of Birth: 11/18/1945 Admit Type: Inpatient Age: 72 Room: [facility] ENDO ROOM 4 Gender: Female Note Status: Finalized Procedure:            Colonoscopy Indications:          This is the patient's first colonoscopy, Unexplained                        iron deficiency anemia Providers:            Lin Landsman MD, MD Medicines:            Monitored Anesthesia Care Complications:        No immediate complications. Estimated blood loss: None. Procedure:            Pre-Anesthesia Assessment:                       - Prior to the procedure, a History and Physical was                        performed, and patient medications and allergies were                        reviewed. The patient is competent. The risks and                        benefits of the procedure and the sedation options and                        risks were discussed with the patient. All questions                        were answered and informed consent was obtained.                        Patient identification and proposed procedure were                        verified by the physician, the nurse, the                        anesthesiologist, the anesthetist and the technician in                        the pre-procedure area in the procedure room in the                        endoscopy suite. Mental Status Examination: alert and                        oriented. Airway Examination: normal oropharyngeal                        airway and neck mobility. Respiratory Examination:                        clear to auscultation. CV Examination: normal.  Prophylactic Antibiotics: The patient does not require                        prophylactic antibiotics. Prior Anticoagulants: The                        patient has taken Eliquis (apixaban), last dose was 2                         days prior to procedure. ASA Grade Assessment: III - A                        patient with severe systemic disease. After reviewing                        the risks and benefits, the patient was deemed in                        satisfactory condition to undergo the procedure. The                        anesthesia plan was to use monitored anesthesia care                        (MAC). Immediately prior to administration of                        medications, the patient was re-assessed for adequacy                        to receive sedatives. The heart rate, respiratory rate,                        oxygen saturations, blood pressure, adequacy of                        pulmonary ventilation, and response to care were                        monitored throughout the procedure. The physical status                        of the patient was re-assessed after the procedure.                       After obtaining informed consent, the colonoscope was                        passed under direct vision. Throughout the procedure,                        the patient's blood pressure, pulse, and oxygen                        saturations were monitored continuously. The                        Colonoscope was introduced through the anus and  advanced to the the cecum, identified by appendiceal                        orifice and ileocecal valve. The colonoscopy was                        unusually difficult due to inadequate bowel prep,                        significant looping, a tortuous colon and the patient's                        body habitus. Successful completion of the procedure                        was aided by changing the patient to a prone position,                        using manual pressure and applying abdominal pressure.                        The patient tolerated the procedure fairly well. The                        quality of the bowel  preparation was fair. Findings:      The perianal and digital rectal examinations were normal. Pertinent       negatives include normal sphincter tone and no palpable rectal lesions.      Ten sessile polyps were found in the rectum, descending colon,       transverse colon and ascending colon. The polyps were 7 to 10 mm in       size. These polyps were removed with a hot snare. Resection and       retrieval were complete.      Two sessile polyps were found in the cecum. The polyps were diminutive       in size. These polyps were removed with a cold biopsy forceps. Resection       and retrieval were complete.      The retroflexed view of the distal rectum and anal verge was normal and       showed no anal or rectal abnormalities. Impression:           - Preparation of the colon was fair.                       - Ten 7 to 10 mm polyps in the rectum, in the                        descending colon, in the transverse colon and in the                        ascending colon, removed with a hot snare. Resected and                        retrieved.                       - Two diminutive polyps in the cecum, removed with a  cold biopsy forceps. Resected and retrieved.                       - The distal rectum and anal verge are normal on                        retroflexion view. Recommendation:       - Return patient to hospital ward for ongoing care.                       - Resume previous diet today.                       - Continue present medications.                       - Await pathology results.                       - Repeat colonoscopy in 1 year for surveillance.                       - Return to GI clinic in 2 weeks.                       - Hold eliquis for 3 days given removal of several                        polyps Procedure Code(s):    --- Professional ---                       [phone], Colonoscopy, flexible; with removal of tumor(s),                         polyp(s), or other lesion(s) by snare technique                       [id number], 60, Colonoscopy, flexible; with biopsy, single                        or multiple Diagnosis Code(s):    --- Professional ---                       K62.1, Rectal polyp                       D12.4, Benign neoplasm of descending colon                       D12.3, Benign neoplasm of transverse colon (hepatic                        flexure or splenic flexure)                       D12.2, Benign neoplasm of ascending colon                       D12.0, Benign neoplasm of cecum                       D50.9, Iron deficiency anemia, unspecified CPT copyright 2016 American Medical  Association. All rights reserved. The codes documented in this report are preliminary and upon coder review may  be revised to meet current compliance requirements. Dr. Ulyess Mort Lin Landsman MD, MD 02/20/2017 6:09:58 PM This report has been signed electronically. Number of Addenda: 0 Note Initiated On: 02/20/2017 4:34 PM Scope Withdrawal Time: 0 hours 53 minutes 1 second  Total Procedure Duration: 1 hour 7 minutes 9 seconds       [facility]

## 2017-02-20 NOTE — Transfer of Care (Signed)
Immediate Anesthesia Transfer of Care Note  Patient: Samantha Aguirre  Procedure(s) Performed: ESOPHAGOGASTRODUODENOSCOPY (EGD) (N/A ) COLONOSCOPY (N/A )  Patient Location: PACU  Anesthesia Type:General  Level of Consciousness: sedated  Airway & Oxygen Therapy: Patient Spontanous Breathing and Patient connected to nasal cannula oxygen  Post-op Assessment: Report given to RN and Post -op Vital signs reviewed and stable  Post vital signs: Reviewed and stable  Last Vitals:  Vitals:   02/20/17 1408 02/20/17 1538  BP: (!) 143/66 (!) 144/60  Pulse: 81 68  Resp: 20 20  Temp: 36.6 C (!) 36.3 C  SpO2: 92% 90%    Last Pain:  Vitals:   02/20/17 1538  TempSrc: Tympanic  PainSc:          Complications: No apparent anesthesia complications

## 2017-02-20 NOTE — Progress Notes (Signed)
Inpatient Diabetes Program Recommendations  AACE/ADA: New Consensus Statement on Inpatient Glycemic Control (2015)  Target Ranges:  Prepandial:   less than 140 mg/dL      Peak postprandial:   less than 180 mg/dL (1-2 hours)      Critically ill patients:  140 - 180 mg/dL   Lab Results  Component Value Date   GLUCAP 130 (H) 02/20/2017   HGBA1C 6.0 (H) 10/04/2016    Review of Glycemic Control Results for Samantha Aguirre, Samantha Aguirre (MRN [id number]) as of 02/20/2017 10:06  Ref. Range 02/19/2017 07:41 02/19/2017 11:55 02/19/2017 16:33 02/19/2017 21:20 02/20/2017 07:45  Glucose-Capillary Latest Ref Range: 65 - 99 mg/dL 217 (H) 221 (H) 292 (H) 298 (H) 130 (H)    Diabetes history: Type 2  Outpatient Diabetes medications: Metaglip 5-500mg  2 tabs qam and 1 tab qpm  Current orders for Inpatient glycemic control: Novolog 0-20 units tid, Novolog 0-5 units qhs, Lantus 20 units qhs * steroids 60mg  qday  Inpatient Diabetes Program Recommendations: If this patient is going to remain NPO for an extended period of time- please change Novolog to Novolog 0-20 units q4h  then d/c Novolog 0-5 units qhs and Novolog 0-20 units tid.    Since steroids have been decreased- consider decreasing Lantus to 9 units qhs.  Gentry Fitz, RN, BA, MHA, CDE Diabetes Coordinator Inpatient Diabetes Program  [phone] (Team Pager) [phone] ([place]) 02/20/2017 10:08 AM

## 2017-02-20 NOTE — Progress Notes (Signed)
Patient ID: Samantha Aguirre, female   DOB: 12/10/1945, 72 y.o.   MRN: [id number]   [facility] PROGRESS NOTE  Irma Roulhac XFG:[id number] DOB: Jan 17, 1945 DOA: 02/18/2017 PCP: Olin Hauser, DO  HPI/Subjective: Patient seen earlier and she is feeling better.  Her breathing is much better.  Has not seen any blood.  He did not finish the GoLYTELY prep and received an enema this morning.  Objective: Vitals:   02/20/17 1408 02/20/17 1538  BP: (!) 143/66 (!) 144/60  Pulse: 81 68  Resp: 20 20  Temp: 97.8 F (36.6 C) (!) 97.3 F (36.3 C)  SpO2: 92% 90%    Filed Weights   02/18/17 1136 02/18/17 1622  Weight: 81.6 kg (180 lb) 85.9 kg (189 lb 6.4 oz)    ROS: Review of Systems  Constitutional: Negative for chills and fever.  Eyes: Negative for blurred vision.  Respiratory: Positive for cough, shortness of breath and wheezing.   Cardiovascular: Negative for chest pain.  Gastrointestinal: Negative for abdominal pain, constipation, diarrhea, nausea and vomiting.  Genitourinary: Negative for dysuria.  Musculoskeletal: Negative for joint pain.  Neurological: Negative for dizziness and headaches.   Exam: Physical Exam  HENT:  Nose: No mucosal edema.  Mouth/Throat: No oropharyngeal exudate or posterior oropharyngeal edema.  Eyes: Conjunctivae, EOM and lids are normal. Pupils are equal, round, and reactive to light.  Neck: No JVD present. Carotid bruit is not present. No edema present. No thyroid mass and no thyromegaly present.  Cardiovascular: S1 normal and S2 normal. Exam reveals no gallop.  No murmur heard. Pulses:      Dorsalis pedis pulses are 2+ on the right side, and 2+ on the left side.  Respiratory: No respiratory distress. She has decreased breath sounds in the right lower field and the left lower field. She has wheezes in the left lower field. She has no rhonchi. She has no rales.  GI: Soft. Bowel sounds are normal. There is no tenderness.  Musculoskeletal:        Right ankle: She exhibits swelling.       Left ankle: She exhibits swelling.  Lymphadenopathy:    She has no cervical adenopathy.  Neurological: She is alert. No cranial nerve deficit.  Skin: Skin is warm. No rash noted. Nails show no clubbing.  Psychiatric: She has a normal mood and affect.      Data Reviewed: Basic Metabolic Panel: Recent Labs  Lab 02/18/17 1141 02/19/17 1223 02/19/17 1307 02/20/17 0308  NA 137 TEST WILL BE CREDITED 139 142  K 3.6 TEST WILL BE CREDITED 3.1* 4.3  CL 105 TEST WILL BE CREDITED 106 109  CO2 22 TEST WILL BE CREDITED 22 24  GLUCOSE 202* TEST WILL BE CREDITED 255* 136*  BUN 9 TEST WILL BE CREDITED 13 16  CREATININE 0.66 TEST WILL BE CREDITED 0.79 0.69  CALCIUM 8.3* TEST WILL BE CREDITED 9.2 9.0   CBC: Recent Labs  Lab 02/18/17 1141  02/19/17 0527 02/19/17 0939 02/19/17 1223 02/19/17 1307 02/20/17 0308  WBC 7.8  --   --   --  TEST WILL BE CREDITED 14.6* 19.0*  NEUTROABS 4.5  --   --   --   --   --   --   HGB 7.1*   < > 9.3* 9.1* TEST WILL BE CREDITED 9.4* 8.7*  HCT 23.0*   < > 29.0* 29.3* TEST WILL BE CREDITED 30.1* 28.8*  MCV 70.6*  --   --   --  TEST WILL BE  CREDITED 72.5* 72.6*  PLT 266  --   --   --  TEST WILL BE CREDITED 271 276   < > = values in this interval not displayed.   Cardiac Enzymes: Recent Labs  Lab 02/18/17 1141  TROPONINI <0.03   BNP (last 3 results) Recent Labs    10/03/16 1436  BNP 36.0     CBG: Recent Labs  Lab 02/19/17 1633 02/19/17 2120 02/20/17 0745 02/20/17 1154 02/20/17 1610  GLUCAP 292* 298* 130* 142* 167*     Studies: No results found.  Scheduled Meds: . [MAR Hold] atorvastatin  80 mg Oral q1800  . [MAR Hold] cyanocobalamin  1,000 mcg Intramuscular Daily  . [MAR Hold] docusate sodium  100 mg Oral BID  . [MAR Hold] DULoxetine  60 mg Oral QHS  . [MAR Hold] fluticasone  1 spray Each Nare Daily  . [MAR Hold] fluticasone furoate-vilanterol  1 puff Inhalation Daily  . [MAR Hold]  furosemide  10 mg Oral Daily  . [MAR Hold] gabapentin  800 mg Oral BID  . [MAR Hold] insulin aspart  0-20 Units Subcutaneous TID WC  . [MAR Hold] insulin aspart  0-5 Units Subcutaneous QHS  . [MAR Hold] insulin glargine  8 Units Subcutaneous QHS  . [MAR Hold] ipratropium-albuterol  3 mL Nebulization QID  . [MAR Hold] levETIRAcetam  500 mg Oral BID  . [MAR Hold] levothyroxine  175 mcg Oral QAC breakfast  . [MAR Hold] lisinopril  20 mg Oral Daily  . [MAR Hold] methylPREDNISolone (SOLU-MEDROL) injection  60 mg Intravenous Daily  . [MAR Hold] nicotine  14 mg Transdermal Daily  . [MAR Hold] pantoprazole  40 mg Intravenous Q12H  . [MAR Hold] potassium chloride  10 mEq Oral Daily  . [MAR Hold] sucralfate  1 g Oral QID  . [MAR Hold] umeclidinium bromide  1 puff Inhalation Daily   Continuous Infusions: . sodium chloride    . sodium chloride 1,000 mL (02/20/17 1559)    Assessment/Plan:   1. COPD exacerbation.  Continue daily dose Solu-Medrol.  Likely can switch over to prednisone and potentially be discharged tomorrow.  Continue DuoNeb and usual inhalers. 2. Iron deficiency anemia, GI bleed.  Patient was given IV iron.  Patient states that she is not bleeding.  Eliquis on hold.  Patient placed on Protonix drip.  Patient for GI procedures today 3. Vitamin B12 deficiency on IM B12 injections.  Will need oral supplementation upon discharge home 4. Chronic atrial fibrillation Eliquis on hold. 5. Hypothyroidism unspecified on levothyroxine 6. Morbid obesity.  Weight loss needed 7. Tobacco abuse on nicotine patch 8. Hyperlipidemia unspecified on atorvastatin 9. Type 2 diabetes mellitus.  On Lantus and sliding scale.  Decreasing dose of steroid should help. 10. History of seizure on Keppra 11. History of coronary artery disease and diastolic congestive heart failure.  No signs of heart failure at this time.  Code Status:     Code Status Orders  (From admission, onward)        Start      Ordered   02/18/17 1624  Full code  Continuous     02/18/17 1623    Code Status History    Date Active Date Inactive Code Status Order ID Comments User Context   10/03/2016 18:41 10/04/2016 17:39 Full Code [id number]  Demetrios Loll, MD Inpatient   02/27/2016 17:44 02/28/2016 20:34 Full Code [id number]  Vaughan Basta, MD Inpatient   02/19/2016 15:53 02/21/2016 20:41 Full Code [id number]  Gladstone Lighter, MD Inpatient  12/27/2015 13:09 01/01/2016 18:33 DNR [id number]  Loletha Grayer, MD ED   09/16/2015 18:52 09/19/2015 19:29 Full Code [id number]  Baxter Hire, MD Inpatient   08/16/2015 17:14 08/19/2015 17:35 DNR [id number]  Willia Craze, NP Inpatient     Disposition Plan: Potential disposition in the next day or so  Consultants:  Gastroenterology  Time spent: 25 minutes  [place]

## 2017-02-21 LAB — GLUCOSE, CAPILLARY
GLUCOSE-CAPILLARY: 92 mg/dL (ref 65–99)
Glucose-Capillary: 143 mg/dL — ABNORMAL HIGH (ref 65–99)

## 2017-02-21 MED ORDER — FERROUS SULFATE 325 (65 FE) MG PO TBEC: 325.0000 mg | DELAYED_RELEASE_TABLET | Freq: Two times a day (BID) | ORAL | 0 refills | Status: DC

## 2017-02-21 NOTE — Discharge Instructions (Signed)
Hold taking eliquis. You can start Eliquis from 02/27/2017.

## 2017-02-22 ENCOUNTER — Encounter: Payer: Self-pay | Admitting: Gastroenterology

## 2017-02-23 ENCOUNTER — Telehealth: Payer: Self-pay

## 2017-02-23 NOTE — Telephone Encounter (Signed)
Transition Care Management Follow-up Telephone Call   Date discharged? 02/21/2017   How have you been since you were released from the hospital? "Not very well yesterday, feeling good today"   Do you understand why you were in the hospital? yes   Do you understand the discharge instructions? yes   Where were you discharged to? home   Items Reviewed:  Medications reviewed: yes  Allergies reviewed: yes  Dietary changes reviewed: yes  Referrals reviewed: yes   Functional Questionnaire:   Activities of Daily Living (ADLs):   She states they are independent in the following: ambulation, bathing and hygiene, feeding, continence, grooming, toileting and dressing States they require assistance with the following: house work   Any transportation issues/concerns?: no, prefers [facility] and hospital follow up on same day   Any patient concerns? Yes, BP concerns - "elevated recently "   Confirmed importance and date/time of follow-up visits scheduled yes  Provider Appointment booked with Dr.karamalegos for 03/10/2017 at 11:00am per pt request  Confirmed with patient if condition begins to worsen call PCP or go to the ER.  Patient was given the office number and encouraged to call back with question or concerns.  : yes

## 2017-02-23 NOTE — Anesthesia Postprocedure Evaluation (Signed)
Anesthesia Post Note  Patient: Samantha Aguirre  Procedure(s) Performed: ESOPHAGOGASTRODUODENOSCOPY (EGD) (N/A ) COLONOSCOPY (N/A )  Patient location during evaluation: PACU Anesthesia Type: General Level of consciousness: awake and alert and oriented Pain management: pain level controlled Vital Signs Assessment: post-procedure vital signs reviewed and stable Respiratory status: spontaneous breathing Cardiovascular status: blood pressure returned to baseline Anesthetic complications: no     Last Vitals:  Vitals:   02/21/17 0457 02/21/17 1210  BP: (!) 143/69 119/61  Pulse: 71 65  Resp: 16   Temp: 37 C 36.9 C  SpO2: 95% 95%    Last Pain:  Vitals:   02/21/17 1210  TempSrc: Oral  PainSc:                  Jett Kulzer

## 2017-02-24 ENCOUNTER — Encounter: Payer: Self-pay | Admitting: Gastroenterology

## 2017-02-24 LAB — SURGICAL PATHOLOGY

## 2017-02-25 NOTE — Discharge Summary (Signed)
[facility] [place] NAME: Samantha Aguirre    MR#:  [id number]  DATE OF BIRTH:  07/30/1945  DATE OF ADMISSION:  02/18/2017 ADMITTING PHYSICIAN: Gorden Harms, MD  DATE OF DISCHARGE: 02/21/2017  4:00 PM  PRIMARY CARE PHYSICIAN: Olin Hauser, DO   ADMISSION DIAGNOSIS:  COPD exacerbation ([place]) [J44.1] Symptomatic anemia [D64.9]  DISCHARGE DIAGNOSIS:  Active Problems:   COPD (chronic obstructive pulmonary disease) (HCC)   Iron deficiency anemia due to chronic blood loss   SECONDARY DIAGNOSIS:   Past Medical History:  Diagnosis Date  . Acid reflux   . Arthritis    Bilateral knees, hands, feet  . Asthma   . Atrial fibrillation ([place])   . Cancer ([place])   . Chest pain   . CHF (congestive heart failure) (HCC)    Diastolic CHF  . COPD (chronic obstructive pulmonary disease) ([place])   . Coronary artery disease   . Diabetes mellitus without complication ([place])   . Frequent headaches   . Heart attack ([place])   . High cholesterol   . HLD (hyperlipidemia)   . Hypertension   . MI (myocardial infarction) ([place])   . Seizures ([place])   . Skin cancer 2015   Suspected basal cell carcinoma on nose, surgically removed  . Stroke ([place])   . Thyroid disease   . Tremors of nervous system    ADMITTING HISTORY  HISTORY OF PRESENT ILLNESS: Samantha Aguirre  is a 72 y.o. female with a known history per below presenting to the emergency room with 3-4-day history of worsening shortness of breath, lightheadedness, dizziness, in the emergency room patient was found to have anemia of 7.1-down from 11 in October, patient was guaiac positive, patient evaluated in the emergency room, daughter at the bedside, patient is now being admitted for acute COPD exacerbation, acute GI bleeding with symptomatic anemia.  HOSPITAL COURSE:   *Acute COPD exacerbation *Acute blood loss anemia due to lower GI bleed from colon polyps (Iron deficiency, B12  deficiency *Chronic atrial fibrillation on Eliquis *Type 2 diabetes mellitus *History of seizures  Patient was admitted to medical floor and started on IV Solu-Medrol, nebulizers.  She improved well with her COPD.  After her wheezing resolved and she was off oxygen patient had EGD done.  EGD was normal.  This was followed by colonoscopy which showed colon polyps which were the source of bleeding.  Colon polyps resected and biopsy pending.  She has been instructed to restart her Eliquis after 1 week.  Patient also placed on iron and B12.  Follow-up with GI and PCP.  Stable for discharge home with no further bleeding and stable hemoglobin.  CONSULTS OBTAINED:    DRUG ALLERGIES:   Allergies  Allergen Reactions  . Mushroom Extract Complex Anaphylaxis    Made patient "deathly sick"  . Atenolol     "MD took me off of it bc it was doing something wrong" Made patient HYPOTENSIVE  . Ivp Dye [Iodinated Diagnostic Agents] Itching    Pt injected with IV contrast.  5 min after injection pt complained of itching behind ear and on her abd.  . Morphine And Related Other (See Comments)    "stops my breathing" NEAR-RESPIRATORY ARREST  . Percocet [Oxycodone-Acetaminophen] Nausea And Vomiting and Other (See Comments)    Stomach pains  . Percodan [Oxycodone-Aspirin] Nausea And Vomiting and Other (See Comments)    Stomach pains  . Verapamil Other (See Comments)    " MD told  me this was screwing me up" MAKES PATIENT HYPOTENSIVE    DISCHARGE MEDICATIONS:   Allergies as of 02/21/2017      Reactions   Mushroom Extract Complex Anaphylaxis   Made patient "deathly sick"   Atenolol    "MD took me off of it bc it was doing something wrong" Made patient HYPOTENSIVE   Ivp Dye [iodinated Diagnostic Agents] Itching   Pt injected with IV contrast.  5 min after injection pt complained of itching behind ear and on her abd.   Morphine And Related Other (See Comments)   "stops my breathing" NEAR-RESPIRATORY  ARREST   Percocet [oxycodone-acetaminophen] Nausea And Vomiting, Other (See Comments)   Stomach pains   Percodan [oxycodone-aspirin] Nausea And Vomiting, Other (See Comments)   Stomach pains   Verapamil Other (See Comments)   " MD told me this was screwing me up" MAKES PATIENT HYPOTENSIVE      Medication List    STOP taking these medications   baclofen 10 MG tablet Commonly known as:  LIORESAL     TAKE these medications   albuterol 108 (90 Base) MCG/ACT inhaler Commonly known as:  PROVENTIL HFA;VENTOLIN HFA Inhale 2 puffs into the lungs every 6 (six) hours as needed for wheezing or shortness of breath.   apixaban 5 MG Tabs tablet Commonly known as:  ELIQUIS Take 5 mg by mouth every 12 (twelve) hours.   atorvastatin 80 MG tablet Commonly known as:  LIPITOR Take 1 tablet (80 mg total) by mouth daily at 6 PM.   diclofenac sodium 1 % Gel Commonly known as:  VOLTAREN Apply 2 g topically 4 (four) times daily.   DULoxetine 60 MG capsule Commonly known as:  CYMBALTA Take 1 capsule (60 mg total) by mouth at bedtime.   ferrous sulfate 325 (65 FE) MG EC tablet Take 1 tablet (325 mg total) by mouth 2 (two) times daily with a meal.   fluticasone 50 MCG/ACT nasal spray Commonly known as:  FLONASE Place 1 spray into both nostrils daily.   fluticasone furoate-vilanterol 100-25 MCG/INH Aepb Commonly known as:  BREO ELLIPTA Inhale 1 puff into the lungs daily. What changed:    when to take this  reasons to take this  additional instructions   furosemide 20 MG tablet Commonly known as:  LASIX Take 10 mg by mouth daily.   gabapentin 800 MG tablet Commonly known as:  NEURONTIN Take 1 tablet (800 mg total) by mouth 2 (two) times daily.   glipiZIDE-metformin 5-500 MG tablet Commonly known as:  METAGLIP Take by mouth 2 (two) times daily. 2 tablets every morning and 1 tablet at bedtime   levETIRAcetam 500 MG tablet Commonly known as:  KEPPRA Take 1 tablet (500 mg total) by  mouth 2 (two) times daily.   levothyroxine 175 MCG tablet Commonly known as:  SYNTHROID, LEVOTHROID Take 1 tablet (175 mcg total) by mouth daily before breakfast.   lisinopril 20 MG tablet Commonly known as:  PRINIVIL,ZESTRIL Take 1 tablet (20 mg total) by mouth daily.   nitroGLYCERIN 0.4 MG SL tablet Commonly known as:  NITROSTAT Place 1 tablet (0.4 mg total) under the tongue every 5 (five) minutes as needed for chest pain.   omeprazole 20 MG capsule Commonly known as:  PRILOSEC Take 1 capsule (20 mg total) by mouth daily.   potassium chloride 10 MEQ CR capsule Commonly known as:  MICRO-K Take 1 capsule (10 mEq total) by mouth daily.   sucralfate 1 g tablet Commonly known as:  CARAFATE Take 1 tablet by mouth 4 (four) times daily.   SUMAtriptan 25 MG tablet Commonly known as:  IMITREX Take 1 tablet (25 mg total) by mouth once as needed for up to 1 dose for migraine. May repeat one dose in 2 hours if persists, max in 24 hrs   TYLENOL 325 MG tablet Generic drug:  acetaminophen Take 650 mg by mouth 2 (two) times daily.   umeclidinium bromide 62.5 MCG/INH Aepb Commonly known as:  INCRUSE ELLIPTA Inhale 1 puff into the lungs daily.       Today   VITAL SIGNS:  Blood pressure 119/61, pulse 65, temperature 98.5 F (36.9 C), temperature source Oral, resp. rate 16, height 5\' 1"  (1.549 m), weight 85.9 kg (189 lb 6.4 oz), SpO2 95 %.  I/O:  No intake or output data in the 24 hours ending 02/25/17 1440  PHYSICAL EXAMINATION:  Physical Exam  GENERAL:  72 y.o.-year-old patient lying in the bed with no acute distress.  LUNGS: Normal breath sounds bilaterally, no wheezing, rales,rhonchi or crepitation. No use of accessory muscles of respiration.  CARDIOVASCULAR: S1, S2 normal. No murmurs, rubs, or gallops.  ABDOMEN: Soft, non-tender, non-distended. Bowel sounds present. No organomegaly or mass.  NEUROLOGIC: Moves all 4 extremities. PSYCHIATRIC: The patient is alert and  oriented x 3.  SKIN: No obvious rash, lesion, or ulcer.   DATA REVIEW:   CBC Recent Labs  Lab 02/20/17 0308  WBC 19.0*  HGB 8.7*  HCT 28.8*  PLT 276    Chemistries  Recent Labs  Lab 02/20/17 0308  NA 142  K 4.3  CL 109  CO2 24  GLUCOSE 136*  BUN 16  CREATININE 0.69  CALCIUM 9.0    Cardiac Enzymes No results for input(s): TROPONINI in the last 168 hours.  Microbiology Results  Results for orders placed or performed during the hospital encounter of 08/16/15  Urine culture     Status: Abnormal   Collection Time: 08/16/15  3:17 PM  Result Value Ref Range Status   Specimen Description URINE, RANDOM  Final   Special Requests NONE  Final   Culture MULTIPLE SPECIES PRESENT, SUGGEST RECOLLECTION (A)  Final   Report Status 08/17/2015 FINAL  Final    RADIOLOGY:  No results found.  Follow up with PCP in 1 week.  Management plans discussed with the patient, family and they are in agreement.  CODE STATUS:  Code Status History    Date Active Date Inactive Code Status Order ID Comments User Context   02/18/2017 16:23 02/21/2017 19:19 Full Code [id number]  Gorden Harms, MD Inpatient   10/03/2016 18:41 10/04/2016 17:39 Full Code [id number]  Demetrios Loll, MD Inpatient   02/27/2016 17:44 02/28/2016 20:34 Full Code [id number]  Vaughan Basta, MD Inpatient   02/19/2016 15:53 02/21/2016 20:41 Full Code [id number]  Gladstone Lighter, MD Inpatient   12/27/2015 13:09 01/01/2016 18:33 DNR [id number]  Loletha Grayer, MD ED   09/16/2015 18:52 09/19/2015 19:29 Full Code [id number]  Baxter Hire, MD Inpatient   08/16/2015 17:14 08/19/2015 17:35 DNR [id number]  Willia Craze, NP Inpatient      TOTAL TIME TAKING CARE OF THIS PATIENT ON DAY OF DISCHARGE: more than 30 minutes.   Neita Carp M.D on 02/25/2017 at 2:40 PM  Between 7am to 6pm - Pager - [phone]  After 6pm go to [email or web address] - password [email or web address] [facility]  Office  [phone]  CC: Primary  care physician; Olin Hauser, DO  Note:  This dictation was prepared with Dragon dictation along with smaller phrase technology. Any transcriptional errors that result from this process are unintentional.

## 2017-03-02 ENCOUNTER — Inpatient Hospital Stay: Payer: Medicare Other | Admitting: Family Medicine

## 2017-03-10 ENCOUNTER — Telehealth: Payer: Self-pay | Admitting: *Deleted

## 2017-03-10 ENCOUNTER — Other Ambulatory Visit: Payer: Self-pay | Admitting: Family Medicine

## 2017-03-10 ENCOUNTER — Ambulatory Visit (INDEPENDENT_AMBULATORY_CARE_PROVIDER_SITE_OTHER): Payer: Medicare Other

## 2017-03-10 VITALS — BP 158/78 | HR 64 | Temp 97.9°F | Resp 16 | Ht 61.0 in | Wt 189.0 lb

## 2017-03-10 DIAGNOSIS — Z23 Encounter for immunization: Secondary | ICD-10-CM | POA: Diagnosis not present

## 2017-03-10 DIAGNOSIS — Z0000 Encounter for general adult medical examination without abnormal findings: Secondary | ICD-10-CM | POA: Diagnosis not present

## 2017-03-10 DIAGNOSIS — D500 Iron deficiency anemia secondary to blood loss (chronic): Secondary | ICD-10-CM

## 2017-03-10 NOTE — Progress Notes (Signed)
Spoke with Dr. Parks Ranger regarding dark stools.  Recommended CBC today, and to call [facility] if symptoms worsen or she develops any new symptoms before her hospital follow up appt on 03/17/2017. Patient declined CBC today, requests it to be done on 3/5 when she comes in. States she will call [facility] if she feels any worse interm. (phone number provided) Informed Dr.Karamalegos.

## 2017-03-10 NOTE — Telephone Encounter (Signed)
Received referral for initial lung cancer screening scan. Contacted patient who wants to discuss further with her daughter and then potentially have scan in April.

## 2017-03-10 NOTE — Patient Instructions (Addendum)
Samantha Aguirre , Thank you for taking time to come for your Medicare Wellness Visit. I appreciate your ongoing commitment to your health goals. Please review the following plan we discussed and let me know if I can assist you in the future.   Screening recommendations/referrals: Colonoscopy: completed 02/20/2017 Mammogram: completed 10/10/2017 Bone Density: completed 01/13/2014  Recommended yearly ophthalmology/optometry visit for glaucoma screening and checkup Recommended yearly dental visit for hygiene and checkup  Vaccinations: Influenza vaccine: up to date Pneumococcal vaccine: completed series today Tdap vaccine: up to date Shingles vaccine: shingrix eligible, check with your insurance company for coverage    Advanced directives: Advance directive discussed with you today. I have provided a copy for you to complete at home and have notarized. Once this is complete please bring a copy in to our office so we can scan it into your chart.  Conditions/risks identified: Smoking cessation discussed, not ready to quit at this time. [name],RN will contact you regarding lung cancer screening.   Next appointment:  Follow up on 03/17/2017 at 11:00am with Dr.Karamalegos.  Follow up in one year for your annual wellness exam.    Preventive Care 65 Years and Older, Female Preventive care refers to lifestyle choices and visits with your health care provider that can promote health and wellness. What does preventive care include?  A yearly physical exam. This is also called an annual well check.  Dental exams once or twice a year.  Routine eye exams. Ask your health care provider how often you should have your eyes checked.  Personal lifestyle choices, including:  Daily care of your teeth and gums.  Regular physical activity.  Eating a healthy diet.  Avoiding tobacco and drug use.  Limiting alcohol use.  Practicing safe sex.  Taking low-dose aspirin every day.  Taking vitamin  and mineral supplements as recommended by your health care provider. What happens during an annual well check? The services and screenings done by your health care provider during your annual well check will depend on your age, overall health, lifestyle risk factors, and family history of disease. Counseling  Your health care provider may ask you questions about your:  Alcohol use.  Tobacco use.  Drug use.  Emotional well-being.  Home and relationship well-being.  Sexual activity.  Eating habits.  History of falls.  Memory and ability to understand (cognition).  Work and work Statistician.  Reproductive health. Screening  You may have the following tests or measurements:  Height, weight, and BMI.  Blood pressure.  Lipid and cholesterol levels. These may be checked every 5 years, or more frequently if you are over 61 years old.  Skin check.  Lung cancer screening. You may have this screening every year starting at age 71 if you have a 30-pack-year history of smoking and currently smoke or have quit within the past 15 years.  Fecal occult blood test (FOBT) of the stool. You may have this test every year starting at age 38.  Flexible sigmoidoscopy or colonoscopy. You may have a sigmoidoscopy every 5 years or a colonoscopy every 10 years starting at age 101.  Hepatitis C blood test.  Hepatitis B blood test.  Sexually transmitted disease (STD) testing.  Diabetes screening. This is done by checking your blood sugar (glucose) after you have not eaten for a while (fasting). You may have this done every 1-3 years.  Bone density scan. This is done to screen for osteoporosis. You may have this done starting at age 57.  Mammogram.  This may be done every 1-2 years. Talk to your health care provider about how often you should have regular mammograms. Talk with your health care provider about your test results, treatment options, and if necessary, the need for more  tests. Vaccines  Your health care provider may recommend certain vaccines, such as:  Influenza vaccine. This is recommended every year.  Tetanus, diphtheria, and acellular pertussis (Tdap, Td) vaccine. You may need a Td booster every 10 years.  Zoster vaccine. You may need this after age 9.  Pneumococcal 13-valent conjugate (PCV13) vaccine. One dose is recommended after age 89.  Pneumococcal polysaccharide (PPSV23) vaccine. One dose is recommended after age 60. Talk to your health care provider about which screenings and vaccines you need and how often you need them. This information is not intended to replace advice given to you by your health care provider. Make sure you discuss any questions you have with your health care provider. Document Released: 01/26/2015 Document Revised: 09/19/2015 Document Reviewed: 10/31/2014 Elsevier Interactive Patient Education  2017 [place] Prevention in the Home Falls can cause injuries. They can happen to people of all ages. There are many things you can do to make your home safe and to help prevent falls. What can I do on the outside of my home?  Regularly fix the edges of walkways and driveways and fix any cracks.  Remove anything that might make you trip as you walk through a door, such as a raised step or threshold.  Trim any bushes or trees on the path to your home.  Use bright outdoor lighting.  Clear any walking paths of anything that might make someone trip, such as rocks or tools.  Regularly check to see if handrails are loose or broken. Make sure that both sides of any steps have handrails.  Any raised decks and porches should have guardrails on the edges.  Have any leaves, snow, or ice cleared regularly.  Use sand or salt on walking paths during winter.  Clean up any spills in your garage right away. This includes oil or grease spills. What can I do in the bathroom?  Use night lights.  Install grab bars by the  toilet and in the tub and shower. Do not use towel bars as grab bars.  Use non-skid mats or decals in the tub or shower.  If you need to sit down in the shower, use a plastic, non-slip stool.  Keep the floor dry. Clean up any water that spills on the floor as soon as it happens.  Remove soap buildup in the tub or shower regularly.  Attach bath mats securely with double-sided non-slip rug tape.  Do not have throw rugs and other things on the floor that can make you trip. What can I do in the bedroom?  Use night lights.  Make sure that you have a light by your bed that is easy to reach.  Do not use any sheets or blankets that are too big for your bed. They should not hang down onto the floor.  Have a firm chair that has side arms. You can use this for support while you get dressed.  Do not have throw rugs and other things on the floor that can make you trip. What can I do in the kitchen?  Clean up any spills right away.  Avoid walking on wet floors.  Keep items that you use a lot in easy-to-reach places.  If you need to reach something  above you, use a strong step stool that has a grab bar.  Keep electrical cords out of the way.  Do not use floor polish or wax that makes floors slippery. If you must use wax, use non-skid floor wax.  Do not have throw rugs and other things on the floor that can make you trip. What can I do with my stairs?  Do not leave any items on the stairs.  Make sure that there are handrails on both sides of the stairs and use them. Fix handrails that are broken or loose. Make sure that handrails are as long as the stairways.  Check any carpeting to make sure that it is firmly attached to the stairs. Fix any carpet that is loose or worn.  Avoid having throw rugs at the top or bottom of the stairs. If you do have throw rugs, attach them to the floor with carpet tape.  Make sure that you have a light switch at the top of the stairs and the bottom of  the stairs. If you do not have them, ask someone to add them for you. What else can I do to help prevent falls?  Wear shoes that:  Do not have high heels.  Have rubber bottoms.  Are comfortable and fit you well.  Are closed at the toe. Do not wear sandals.  If you use a stepladder:  Make sure that it is fully opened. Do not climb a closed stepladder.  Make sure that both sides of the stepladder are locked into place.  Ask someone to hold it for you, if possible.  Clearly mark and make sure that you can see:  Any grab bars or handrails.  First and last steps.  Where the edge of each step is.  Use tools that help you move around (mobility aids) if they are needed. These include:  Canes.  Walkers.  Scooters.  Crutches.  Turn on the lights when you go into a dark area. Replace any light bulbs as soon as they burn out.  Set up your furniture so you have a clear path. Avoid moving your furniture around.  If any of your floors are uneven, fix them.  If there are any pets around you, be aware of where they are.  Review your medicines with your doctor. Some medicines can make you feel dizzy. This can increase your chance of falling. Ask your doctor what other things that you can do to help prevent falls. This information is not intended to replace advice given to you by your health care provider. Make sure you discuss any questions you have with your health care provider. Document Released: 10/26/2008 Document Revised: 06/07/2015 Document Reviewed: 02/03/2014 Elsevier Interactive Patient Education  2017 [place].  Pneumococcal Polysaccharide Vaccine: What You Need to Know 1. Why get vaccinated? Vaccination can protect older adults (and some children and younger adults) from pneumococcal disease. Pneumococcal disease is caused by bacteria that can spread from person to person through close contact. It can cause ear infections, and it can also lead to more serious  infections of the:  Lungs (pneumonia),  Blood (bacteremia), and  Covering of the brain and spinal cord (meningitis). Meningitis can cause deafness and brain damage, and it can be fatal.  Anyone can get pneumococcal disease, but children under 79 years of age, people with certain medical conditions, adults over 10 years of age, and cigarette smokers are at the highest risk. About 18,000 older adults die each year from pneumococcal  disease in the [place]. Treatment of pneumococcal infections with penicillin and other drugs used to be more effective. But some strains of the disease have become resistant to these drugs. This makes prevention of the disease, through vaccination, even more important. 2. Pneumococcal polysaccharide vaccine (PPSV23) Pneumococcal polysaccharide vaccine (PPSV23) protects against 23 types of pneumococcal bacteria. It will not prevent all pneumococcal disease. PPSV23 is recommended for:  All adults 60 years of age and older,  Anyone 2 through 73 years of age with certain long-term health problems,  Anyone 2 through 73 years of age with a weakened immune system,  Adults 55 through 73 years of age who smoke cigarettes or have asthma.  Most people need only one dose of PPSV. A second dose is recommended for certain high-risk groups. People 37 and older should get a dose even if they have gotten one or more doses of the vaccine before they turned 65. Your healthcare provider can give you more information about these recommendations. Most healthy adults develop protection within 2 to 3 weeks of getting the shot. 3. Some people should not get this vaccine  Anyone who has had a life-threatening allergic reaction to PPSV should not get another dose.  Anyone who has a severe allergy to any component of PPSV should not receive it. Tell your provider if you have any severe allergies.  Anyone who is moderately or severely ill when the shot is scheduled may be asked to  wait until they recover before getting the vaccine. Someone with a mild illness can usually be vaccinated.  Children less than 25 years of age should not receive this vaccine.  There is no evidence that PPSV is harmful to either a pregnant woman or to her fetus. However, as a precaution, women who need the vaccine should be vaccinated before becoming pregnant, if possible. 4. Risks of a vaccine reaction With any medicine, including vaccines, there is a chance of side effects. These are usually mild and go away on their own, but serious reactions are also possible. About half of people who get PPSV have mild side effects, such as redness or pain where the shot is given, which go away within about two days. Less than 1 out of 100 people develop a fever, muscle aches, or more severe local reactions. Problems that could happen after any vaccine:  People sometimes faint after a medical procedure, including vaccination. Sitting or lying down for about 15 minutes can help prevent fainting, and injuries caused by a fall. Tell your doctor if you feel dizzy, or have vision changes or ringing in the ears.  Some people get severe pain in the shoulder and have difficulty moving the arm where a shot was given. This happens very rarely.  Any medication can cause a severe allergic reaction. Such reactions from a vaccine are very rare, estimated at about 1 in a million doses, and would happen within a few minutes to a few hours after the vaccination. As with any medicine, there is a very remote chance of a vaccine causing a serious injury or death. The safety of vaccines is always being monitored. For more information, visit: http://www.aguilar.org/ 5. What if there is a serious reaction? What should I look for? Look for anything that concerns you, such as signs of a severe allergic reaction, very high fever, or unusual behavior. Signs of a severe allergic reaction can include hives, swelling of the face and  throat, difficulty breathing, a fast heartbeat, dizziness, and weakness. These  would usually start a few minutes to a few hours after the vaccination. What should I do? If you think it is a severe allergic reaction or other emergency that can't wait, call 9-1-1 or get to the nearest hospital. Otherwise, call your doctor. Afterward, the reaction should be reported to the Vaccine Adverse Event Reporting System (VAERS). Your doctor might file this report, or you can do it yourself through the VAERS web site at www.vaers.SamedayNews.es, or by calling 782-380-1602. VAERS does not give medical advice. 6. How can I learn more?  Ask your doctor. He or she can give you the vaccine package insert or suggest other sources of information.  Call your local or state health department.  Contact the Centers for Disease Control and Prevention (CDC): ? Call (531)648-8607 (1-800-CDC-INFO) or ? Visit CDC's website at http://hunter.com/ CDC Pneumococcal Polysaccharide Vaccine VIS (05/06/13) This information is not intended to replace advice given to you by your health care provider. Make sure you discuss any questions you have with your health care provider. Document Released: 10/27/2005 Document Revised: 09/20/2015 Document Reviewed: 09/20/2015 Elsevier Interactive Patient Education  2017 [employer].

## 2017-03-10 NOTE — Progress Notes (Signed)
Subjective:   Samantha Aguirre is a 72 y.o. female who presents for Medicare Annual (Subsequent) preventive examination.  Review of Systems:   Cardiac Risk Factors include: hypertension;advanced age (>67men, >96 women);diabetes mellitus;obesity (BMI >30kg/m2);dyslipidemia;smoking/ tobacco exposure     Objective:     Vitals: BP (!) 158/78 (BP Location: Left Arm, Patient Position: Sitting)   Pulse 64   Temp 97.9 F (36.6 C) (Oral)   Resp 16   Ht 5\' 1"  (1.549 m)   Wt 189 lb (85.7 kg)   BMI 35.71 kg/m   Body mass index is 35.71 kg/m.  Advanced Directives 03/10/2017 02/20/2017 02/18/2017 02/18/2017 10/20/2016 10/03/2016 10/03/2016  Does Patient Have a Medical Advance Directive? No No No No - Yes;No No  Would patient like information on creating a medical advance directive? Yes (MAU/Ambulatory/Procedural Areas - Information given) No - Patient declined - No - Patient declined Yes (Inpatient - patient requests chaplain consult to create a medical advance directive) Yes (Inpatient - patient requests chaplain consult to create a medical advance directive) No - Patient declined    Tobacco Social History   Tobacco Use  Smoking Status Current Every Day Smoker  . Packs/day: 0.50  . Years: 50.00  . Pack years: 25.00  . Types: Cigarettes  Smokeless Tobacco Current User     Ready to quit: No Counseling given: No   Clinical Intake:  Pre-visit preparation completed: Yes  Pain : No/denies pain     Nutritional Status: BMI > 30  Obese Nutritional Risks: Nausea/ vomitting/ diarrhea(nausea and diarrhea since hospital d/c) Diabetes: Yes CBG done?: No Did pt. bring in CBG monitor from home?: No  How often do you need to have someone help you when you read instructions, pamphlets, or other written materials from your doctor or pharmacy?: 1 - Never What is the last grade level you completed in school?: 12th grade  Interpreter Needed?: No  Information entered by :: Danise Dehne,LPN    Past Medical History:  Diagnosis Date  . Acid reflux   . Arthritis    Bilateral knees, hands, feet  . Asthma   . Atrial fibrillation ([place])   . Cancer ([place])   . Chest pain   . CHF (congestive heart failure) (H[facility])    Diastolic CHF  . COPD (chronic obstructive pulmonary disease) ([place])   . Coronary artery disease   . Diabetes mellitus without complication ([place])   . Frequent headaches   . Heart attack ([place])   . High cholesterol   . HLD (hyperlipidemia)   . Hypertension   . MI (myocardial infarction) ([place])   . Seizures ([place])   . Skin cancer 2015   Suspected basal cell carcinoma on nose, surgically removed  . Stroke ([place])   . Thyroid disease   . Tremors of nervous system    Past Surgical History:  Procedure Laterality Date  . ABDOMINAL HYSTERECTOMY  1976  . APPENDECTOMY  1980  . CAROTID ENDARTERECTOMY    . CHOLECYSTECTOMY  1980  . COLONOSCOPY N/A 02/20/2017   Procedure: COLONOSCOPY;  Surgeon: Lin Landsman, MD;  Location: [facility] ENDOSCOPY;  Service: Gastroenterology;  Laterality: N/A;  . ESOPHAGOGASTRODUODENOSCOPY N/A 02/20/2017   Procedure: ESOPHAGOGASTRODUODENOSCOPY (EGD);  Surgeon: Lin Landsman, MD;  Location: [facility] ENDOSCOPY;  Service: Gastroenterology;  Laterality: N/A;  . [employer]  . KNEE SURGERY     left  . ROTATOR CUFF REPAIR     right  . TONSILLECTOMY  1950   Family History  Problem Relation  Age of Onset  . Breast cancer Mother   . Heart disease Mother   . Stroke Mother   . Cancer Mother   . COPD Mother   . Heart disease Father   . Diabetes Father   . Stroke Father   . Alcohol abuse Sister   . Drug abuse Sister   . Stroke Sister   . Cancer Sister   . Mental illness Sister   . Heart disease Brother   . Arthritis Brother   . Diabetes Brother   . Heart disease Maternal Grandfather   . Heart disease Paternal Grandfather    Social History   Socioeconomic History  . Marital status: Divorced    Spouse name: None  . Number of  children: 2  . Years of education: 49  . Highest education level: None  Social Needs  . Financial resource strain: Somewhat hard  . Food insecurity - worry: Sometimes true  . Food insecurity - inability: Sometimes true  . Transportation needs - medical: No  . Transportation needs - non-medical: No  Occupational History  . Occupation: Disabled  Tobacco Use  . Smoking status: Current Every Day Smoker    Packs/day: 0.50    Years: 50.00    Pack years: 25.00    Types: Cigarettes  . Smokeless tobacco: Current User  Substance and Sexual Activity  . Alcohol use: No  . Drug use: No  . Sexual activity: No  Other Topics Concern  . None  Social History Narrative  . None    Outpatient Encounter Medications as of 03/10/2017  Medication Sig  . acetaminophen (TYLENOL) 325 MG tablet Take 650 mg by mouth 2 (two) times daily.   Marland Kitchen albuterol (PROVENTIL HFA;VENTOLIN HFA) 108 (90 Base) MCG/ACT inhaler Inhale 2 puffs into the lungs every 6 (six) hours as needed for wheezing or shortness of breath.  Marland Kitchen apixaban (ELIQUIS) 5 MG TABS tablet Take 5 mg by mouth every 12 (twelve) hours.  Marland Kitchen atorvastatin (LIPITOR) 80 MG tablet Take 1 tablet (80 mg total) by mouth daily at 6 PM.  . diclofenac sodium (VOLTAREN) 1 % GEL Apply 2 g topically 4 (four) times daily.  . DULoxetine (CYMBALTA) 60 MG capsule Take 1 capsule (60 mg total) by mouth at bedtime.  . fluticasone (FLONASE) 50 MCG/ACT nasal spray Place 1 spray into both nostrils daily.  . fluticasone furoate-vilanterol (BREO ELLIPTA) 100-25 MCG/INH AEPB Inhale 1 puff into the lungs daily. (Patient taking differently: Inhale 1 puff into the lungs daily as needed. For shortness of breath)  . furosemide (LASIX) 20 MG tablet Take 10 mg by mouth daily.   Marland Kitchen gabapentin (NEURONTIN) 800 MG tablet Take 1 tablet (800 mg total) by mouth 2 (two) times daily.  Marland Kitchen glipiZIDE-metformin (METAGLIP) 5-500 MG tablet Take by mouth 2 (two) times daily. 2 tablets every morning and 1 tablet  at bedtime   . levETIRAcetam (KEPPRA) 500 MG tablet Take 1 tablet (500 mg total) by mouth 2 (two) times daily.  Marland Kitchen levothyroxine (SYNTHROID, LEVOTHROID) 175 MCG tablet Take 1 tablet (175 mcg total) by mouth daily before breakfast.  . lisinopril (PRINIVIL,ZESTRIL) 20 MG tablet Take 1 tablet (20 mg total) by mouth daily.  Marland Kitchen omeprazole (PRILOSEC) 20 MG capsule Take 1 capsule (20 mg total) by mouth daily.  . potassium chloride (MICRO-K) 10 MEQ CR capsule Take 1 capsule (10 mEq total) by mouth daily.  . sucralfate (CARAFATE) 1 g tablet Take 1 tablet by mouth 4 (four) times daily.  . SUMAtriptan (  IMITREX) 25 MG tablet Take 1 tablet (25 mg total) by mouth once as needed for up to 1 dose for migraine. May repeat one dose in 2 hours if persists, max in 24 hrs  . ferrous sulfate 325 (65 FE) MG EC tablet Take 1 tablet (325 mg total) by mouth 2 (two) times daily with a meal. (Patient not taking: Reported on 02/23/2017)  . nitroGLYCERIN (NITROSTAT) 0.4 MG SL tablet Place 1 tablet (0.4 mg total) under the tongue every 5 (five) minutes as needed for chest pain. (Patient not taking: Reported on 03/10/2017)  . umeclidinium bromide (INCRUSE ELLIPTA) 62.5 MCG/INH AEPB Inhale 1 puff into the lungs daily. (Patient not taking: Reported on 03/10/2017)   No facility-administered encounter medications on file as of 03/10/2017.     Activities of Daily Living In your present state of health, do you have any difficulty performing the following activities: 03/10/2017 02/18/2017  Hearing? N -  Vision? N -  Difficulty concentrating or making decisions? Y -  Walking or climbing stairs? N -  Dressing or bathing? N -  Comment - -  Doing errands, shopping? N Y  Conservation officer, nature and eating ? N -  Using the Toilet? N -  In the past six months, have you accidently leaked urine? Y -  Comment wears depends -  Do you have problems with loss of bowel control? N -  Managing your Medications? N -  Managing your Finances? N -  Housekeeping  or managing your Housekeeping? N -  Some recent data might be hidden    Patient Care Team: Olin Hauser, DO as PCP - General (Family Medicine) Sharlotte Alamo, DPM (Podiatry) Yolonda Kida, MD as Consulting Physician (Cardiology) Watt Climes, PA as Physician Assistant (Physician Assistant)    Assessment:   This is a routine wellness examination for Samantha Aguirre.  Exercise Activities and Dietary recommendations Current Exercise Habits: The patient does not participate in regular exercise at present, Exercise limited by: None identified  Goals    . Quit Smoking     Smoking cessation discussed       Fall Risk Fall Risk  03/10/2017 11/24/2016 11/13/2015  Falls in the past year? Yes No Yes  Number falls in past yr: 2 or more - 2 or more  Injury with Fall? Yes - Yes  Comment - - Banged left  knee and swollen to touch and hard to walk.  Risk Factor Category  High Fall Risk - -  Risk for fall due to : Impaired balance/gait - -  Risk for fall due to: Comment recently got a cane  - -  Follow up Falls prevention discussed - -   Is the patient's home free of loose throw rugs in walkways, pet beds, electrical cords, etc?   yes      Grab bars in the bathroom? yes      Handrails on the stairs?   no, no stairs       Adequate lighting?   yes  Timed Get Up and Go performed: Completed in 10 seconds with  use of assistive devices- cane , steady gait. No intervention needed at this time.   Depression Screen PHQ 2/9 Scores 03/10/2017 11/24/2016 10/06/2016 11/13/2015  PHQ - 2 Score 2 0 2 4  PHQ- 9 Score 12 - 14 15     Cognitive Function     6CIT Screen 03/10/2017  What Year? 0 points  What month? 0 points  What time? 0 points  Count back from 20 0 points  Months in reverse 0 points  Repeat phrase 0 points  Total Score 0    Immunization History  Administered Date(s) Administered  . Influenza, High Dose Seasonal PF 11/13/2015, 10/04/2016  . Pneumococcal Conjugate-13  11/20/2015  . Pneumococcal Polysaccharide-23 03/10/2017  . Tdap 01/13/2013    Qualifies for Shingles Vaccine? Yes, discussed shingrix vaccine   Screening Tests Health Maintenance  Topic Date Due  . Hepatitis C Screening  09/13/2017 (Originally [birth year])  . HEMOGLOBIN A1C  04/03/2017  . FOOT EXAM  10/06/2017  . OPHTHALMOLOGY EXAM  11/19/2017  . MAMMOGRAM  10/11/2018  . TETANUS/TDAP  01/14/2023  . COLONOSCOPY  02/21/2027  . INFLUENZA VACCINE  Completed  . DEXA SCAN  Completed  . PNA vac Low Risk Adult  Completed    Cancer Screenings: Lung: Low Dose CT Chest recommended if Age 21-80 years, 30 pack-year currently smoking OR have quit w/in 15years. Patient does qualify. Message sent to  Jeb Levering  Breast:  Up to date on Mammogram? Yes   10/10/2016 Up to date of Bone Density/Dexa? Yes Colorectal: colonoscopy completed 02/20/2017  Additional Screenings:  Hepatitis B/HIV/Syphillis: not indicated Hepatitis C Screening: will order with next labs      Plan:    I have personally reviewed and addressed the Medicare Annual Wellness questionnaire and have noted the following in the patient's chart:  A. Medical and social history B. Use of alcohol, tobacco or illicit drugs  C. Current medications and supplements D. Functional ability and status E.  Nutritional status F.  Physical activity G. Advance directives H. List of other physicians I.  Hospitalizations, surgeries, and ER visits in previous 12 months J.  [place] such as hearing and vision if needed, cognitive and depression L. Referrals and appointments   In addition, I have reviewed and discussed with patient certain preventive protocols, quality metrics, and best practice recommendations. A written personalized care plan for preventive services as well as general preventive health recommendations were provided to patient.   Signed,  Tyler Aas, LPN Nurse Health Advisor   Nurse Notes: PHQ-9 score 12,  currently taking Cymbalta for neuropathy   Patient states she is still having dark stools with intermittent diarrhea and nausea, had colonoscopy and blood transfusion in hospital. She comes in for hospital follow up on 03/17/2017. She denies any weakness or dizziness. She denies any abdominal pain currently. She does not have a follow up with GI.

## 2017-03-17 ENCOUNTER — Ambulatory Visit: Payer: Medicare Other

## 2017-03-17 ENCOUNTER — Inpatient Hospital Stay: Payer: Self-pay | Admitting: Family Medicine

## 2017-03-26 ENCOUNTER — Other Ambulatory Visit: Payer: Self-pay | Admitting: Family Medicine

## 2017-03-27 ENCOUNTER — Encounter: Payer: Self-pay | Admitting: Emergency Medicine

## 2017-03-27 ENCOUNTER — Other Ambulatory Visit: Payer: Self-pay

## 2017-03-27 ENCOUNTER — Inpatient Hospital Stay
Admission: EM | Admit: 2017-03-27 | Discharge: 2017-04-02 | DRG: 193 | Disposition: A | Discharge status: Home Health | Payer: Medicare Other | Attending: Internal Medicine | Admitting: Internal Medicine

## 2017-03-27 ENCOUNTER — Emergency Department: Payer: Medicare Other

## 2017-03-27 DIAGNOSIS — Z9102 Food additives allergy status: Secondary | ICD-10-CM

## 2017-03-27 DIAGNOSIS — I480 Paroxysmal atrial fibrillation: Secondary | ICD-10-CM | POA: Diagnosis present

## 2017-03-27 DIAGNOSIS — Z9049 Acquired absence of other specified parts of digestive tract: Secondary | ICD-10-CM

## 2017-03-27 DIAGNOSIS — I5032 Chronic diastolic (congestive) heart failure: Secondary | ICD-10-CM | POA: Diagnosis present

## 2017-03-27 DIAGNOSIS — E876 Hypokalemia: Secondary | ICD-10-CM | POA: Diagnosis present

## 2017-03-27 DIAGNOSIS — I252 Old myocardial infarction: Secondary | ICD-10-CM

## 2017-03-27 DIAGNOSIS — Z85828 Personal history of other malignant neoplasm of skin: Secondary | ICD-10-CM | POA: Diagnosis not present

## 2017-03-27 DIAGNOSIS — E039 Hypothyroidism, unspecified: Secondary | ICD-10-CM | POA: Diagnosis present

## 2017-03-27 DIAGNOSIS — Z91041 Radiographic dye allergy status: Secondary | ICD-10-CM

## 2017-03-27 DIAGNOSIS — R05 Cough: Secondary | ICD-10-CM

## 2017-03-27 DIAGNOSIS — E7800 Pure hypercholesterolemia, unspecified: Secondary | ICD-10-CM | POA: Diagnosis present

## 2017-03-27 DIAGNOSIS — I2510 Atherosclerotic heart disease of native coronary artery without angina pectoris: Secondary | ICD-10-CM | POA: Diagnosis present

## 2017-03-27 DIAGNOSIS — R0602 Shortness of breath: Secondary | ICD-10-CM

## 2017-03-27 DIAGNOSIS — I110 Hypertensive heart disease with heart failure: Secondary | ICD-10-CM | POA: Diagnosis present

## 2017-03-27 DIAGNOSIS — E119 Type 2 diabetes mellitus without complications: Secondary | ICD-10-CM | POA: Diagnosis not present

## 2017-03-27 DIAGNOSIS — E785 Hyperlipidemia, unspecified: Secondary | ICD-10-CM | POA: Diagnosis present

## 2017-03-27 DIAGNOSIS — Z79899 Other long term (current) drug therapy: Secondary | ICD-10-CM

## 2017-03-27 DIAGNOSIS — W1830XA Fall on same level, unspecified, initial encounter: Secondary | ICD-10-CM | POA: Diagnosis not present

## 2017-03-27 DIAGNOSIS — R059 Cough, unspecified: Secondary | ICD-10-CM

## 2017-03-27 DIAGNOSIS — Z90710 Acquired absence of both cervix and uterus: Secondary | ICD-10-CM | POA: Diagnosis not present

## 2017-03-27 DIAGNOSIS — I495 Sick sinus syndrome: Secondary | ICD-10-CM | POA: Diagnosis not present

## 2017-03-27 DIAGNOSIS — I4891 Unspecified atrial fibrillation: Secondary | ICD-10-CM | POA: Diagnosis not present

## 2017-03-27 DIAGNOSIS — R531 Weakness: Secondary | ICD-10-CM | POA: Diagnosis present

## 2017-03-27 DIAGNOSIS — Z803 Family history of malignant neoplasm of breast: Secondary | ICD-10-CM

## 2017-03-27 DIAGNOSIS — M810 Age-related osteoporosis without current pathological fracture: Secondary | ICD-10-CM | POA: Diagnosis present

## 2017-03-27 DIAGNOSIS — Z885 Allergy status to narcotic agent status: Secondary | ICD-10-CM | POA: Diagnosis not present

## 2017-03-27 DIAGNOSIS — Z8249 Family history of ischemic heart disease and other diseases of the circulatory system: Secondary | ICD-10-CM

## 2017-03-27 DIAGNOSIS — J439 Emphysema, unspecified: Secondary | ICD-10-CM | POA: Diagnosis present

## 2017-03-27 DIAGNOSIS — J101 Influenza due to other identified influenza virus with other respiratory manifestations: Principal | ICD-10-CM | POA: Diagnosis present

## 2017-03-27 DIAGNOSIS — Z7901 Long term (current) use of anticoagulants: Secondary | ICD-10-CM

## 2017-03-27 DIAGNOSIS — J111 Influenza due to unidentified influenza virus with other respiratory manifestations: Secondary | ICD-10-CM | POA: Diagnosis not present

## 2017-03-27 DIAGNOSIS — Z888 Allergy status to other drugs, medicaments and biological substances status: Secondary | ICD-10-CM | POA: Diagnosis not present

## 2017-03-27 DIAGNOSIS — Z813 Family history of other psychoactive substance abuse and dependence: Secondary | ICD-10-CM

## 2017-03-27 DIAGNOSIS — R0902 Hypoxemia: Secondary | ICD-10-CM

## 2017-03-27 DIAGNOSIS — Z818 Family history of other mental and behavioral disorders: Secondary | ICD-10-CM

## 2017-03-27 DIAGNOSIS — K219 Gastro-esophageal reflux disease without esophagitis: Secondary | ICD-10-CM | POA: Diagnosis present

## 2017-03-27 DIAGNOSIS — Z8673 Personal history of transient ischemic attack (TIA), and cerebral infarction without residual deficits: Secondary | ICD-10-CM | POA: Diagnosis not present

## 2017-03-27 DIAGNOSIS — Z7984 Long term (current) use of oral hypoglycemic drugs: Secondary | ICD-10-CM

## 2017-03-27 DIAGNOSIS — I471 Supraventricular tachycardia: Secondary | ICD-10-CM | POA: Diagnosis present

## 2017-03-27 DIAGNOSIS — J449 Chronic obstructive pulmonary disease, unspecified: Secondary | ICD-10-CM | POA: Diagnosis not present

## 2017-03-27 DIAGNOSIS — R079 Chest pain, unspecified: Secondary | ICD-10-CM | POA: Diagnosis not present

## 2017-03-27 DIAGNOSIS — G40909 Epilepsy, unspecified, not intractable, without status epilepticus: Secondary | ICD-10-CM | POA: Diagnosis present

## 2017-03-27 DIAGNOSIS — F17210 Nicotine dependence, cigarettes, uncomplicated: Secondary | ICD-10-CM | POA: Diagnosis present

## 2017-03-27 DIAGNOSIS — Z825 Family history of asthma and other chronic lower respiratory diseases: Secondary | ICD-10-CM

## 2017-03-27 DIAGNOSIS — Z8261 Family history of arthritis: Secondary | ICD-10-CM

## 2017-03-27 DIAGNOSIS — J9601 Acute respiratory failure with hypoxia: Secondary | ICD-10-CM | POA: Diagnosis present

## 2017-03-27 DIAGNOSIS — Z79890 Hormone replacement therapy (postmenopausal): Secondary | ICD-10-CM

## 2017-03-27 DIAGNOSIS — Z833 Family history of diabetes mellitus: Secondary | ICD-10-CM

## 2017-03-27 DIAGNOSIS — J441 Chronic obstructive pulmonary disease with (acute) exacerbation: Secondary | ICD-10-CM | POA: Diagnosis not present

## 2017-03-27 DIAGNOSIS — E1140 Type 2 diabetes mellitus with diabetic neuropathy, unspecified: Secondary | ICD-10-CM | POA: Diagnosis present

## 2017-03-27 DIAGNOSIS — Z823 Family history of stroke: Secondary | ICD-10-CM

## 2017-03-27 DIAGNOSIS — Z811 Family history of alcohol abuse and dependence: Secondary | ICD-10-CM

## 2017-03-27 LAB — GLUCOSE, CAPILLARY
GLUCOSE-CAPILLARY: 218 mg/dL — AB (ref 65–99)
Glucose-Capillary: 166 mg/dL — ABNORMAL HIGH (ref 65–99)

## 2017-03-27 LAB — CBC WITH DIFFERENTIAL/PLATELET
Basophils Absolute: 0 10*3/uL (ref 0–0.1)
Basophils Relative: 1 %
EOS PCT: 0 %
Eosinophils Absolute: 0 10*3/uL (ref 0–0.7)
HCT: 33.6 % — ABNORMAL LOW (ref 35.0–47.0)
HEMOGLOBIN: 11 g/dL — AB (ref 12.0–16.0)
LYMPHS ABS: 0.7 10*3/uL — AB (ref 1.0–3.6)
Lymphocytes Relative: 12 %
MCH: 26.7 pg (ref 26.0–34.0)
MCHC: 32.8 g/dL (ref 32.0–36.0)
MCV: 81.4 fL (ref 80.0–100.0)
MONOS PCT: 10 %
Monocytes Absolute: 0.6 10*3/uL (ref 0.2–0.9)
Neutro Abs: 4.5 10*3/uL (ref 1.4–6.5)
Neutrophils Relative %: 77 %
PLATELETS: 178 10*3/uL (ref 150–440)
RBC: 4.13 MIL/uL (ref 3.80–5.20)
RDW: 26.4 % — ABNORMAL HIGH (ref 11.5–14.5)
WBC: 5.8 10*3/uL (ref 3.6–11.0)

## 2017-03-27 LAB — PROTIME-INR
INR: 1.11
Prothrombin Time: 14.2 seconds (ref 11.4–15.2)

## 2017-03-27 LAB — INFLUENZA PANEL BY PCR (TYPE A & B)
INFLBPCR: NEGATIVE
Influenza A By PCR: POSITIVE — AB

## 2017-03-27 LAB — TROPONIN I: Troponin I: <0.03 ng/mL (ref <0.03)

## 2017-03-27 LAB — LACTIC ACID, PLASMA
LACTIC ACID, VENOUS: 2.5 mmol/L — AB (ref 0.5–1.9)
Lactic Acid, Venous: 1.8 mmol/L (ref 0.5–1.9)

## 2017-03-27 LAB — BRAIN NATRIURETIC PEPTIDE: B Natriuretic Peptide: 133 pg/mL — ABNORMAL HIGH (ref 0.0–100.0)

## 2017-03-27 MED ORDER — GABAPENTIN 400 MG PO CAPS
800.0000 mg | ORAL_CAPSULE | Freq: Two times a day (BID) | ORAL | Status: DC
  Administered 2017-03-27 – 2017-04-02 (×12): 800 mg via ORAL
  Filled 2017-03-27 (×12): qty 2

## 2017-03-27 MED ORDER — ACETAMINOPHEN 325 MG PO TABS
650.0000 mg | ORAL_TABLET | Freq: Four times a day (QID) | ORAL | Status: DC | PRN
  Administered 2017-03-27 – 2017-03-30 (×2): 650 mg via ORAL
  Filled 2017-03-27 (×3): qty 2

## 2017-03-27 MED ORDER — INSULIN ASPART 100 UNIT/ML ~~LOC~~ SOLN
0.0000 [IU] | Freq: Three times a day (TID) | SUBCUTANEOUS | Status: DC
  Administered 2017-03-27: 3 [IU] via SUBCUTANEOUS
  Administered 2017-03-28: 1 [IU] via SUBCUTANEOUS
  Filled 2017-03-27 (×2): qty 1

## 2017-03-27 MED ORDER — GLIPIZIDE-METFORMIN HCL 5-500 MG PO TABS: 1.0000 | ORAL_TABLET | Freq: Two times a day (BID) | ORAL | Status: DC

## 2017-03-27 MED ORDER — FLUTICASONE PROPIONATE 50 MCG/ACT NA SUSP
1.0000 | Freq: Every day | NASAL | Status: DC
  Administered 2017-03-28 – 2017-04-02 (×6): 1 via NASAL
  Filled 2017-03-27: qty 16

## 2017-03-27 MED ORDER — OSELTAMIVIR PHOSPHATE 75 MG PO CAPS
75.0000 mg | ORAL_CAPSULE | Freq: Two times a day (BID) | ORAL | Status: AC
  Administered 2017-03-27 – 2017-04-01 (×10): 75 mg via ORAL
  Filled 2017-03-27 (×10): qty 1

## 2017-03-27 MED ORDER — NITROGLYCERIN 0.4 MG SL SUBL: 0.4000 mg | SUBLINGUAL_TABLET | SUBLINGUAL | Status: DC | PRN

## 2017-03-27 MED ORDER — SODIUM CHLORIDE 0.9 % IV SOLN
2.0000 g | INTRAVENOUS | Status: DC
  Administered 2017-03-27: 2 g via INTRAVENOUS
  Filled 2017-03-27 (×2): qty 20

## 2017-03-27 MED ORDER — GLIPIZIDE 5 MG PO TABS
5.0000 mg | ORAL_TABLET | Freq: Every day | ORAL | Status: DC
  Administered 2017-03-27 – 2017-04-01 (×6): 5 mg via ORAL
  Filled 2017-03-27 (×6): qty 1

## 2017-03-27 MED ORDER — OSELTAMIVIR PHOSPHATE 75 MG PO CAPS
75.0000 mg | ORAL_CAPSULE | Freq: Once | ORAL | Status: AC
  Administered 2017-03-27: 75 mg via ORAL
  Filled 2017-03-27: qty 1

## 2017-03-27 MED ORDER — OSELTAMIVIR PHOSPHATE 75 MG PO CAPS
75.0000 mg | ORAL_CAPSULE | Freq: Two times a day (BID) | ORAL | Status: DC
  Filled 2017-03-27: qty 1

## 2017-03-27 MED ORDER — ATORVASTATIN CALCIUM 20 MG PO TABS
80.0000 mg | ORAL_TABLET | Freq: Every day | ORAL | Status: DC
  Administered 2017-03-27 – 2017-04-01 (×6): 80 mg via ORAL
  Filled 2017-03-27 (×6): qty 4

## 2017-03-27 MED ORDER — LEVOTHYROXINE SODIUM 175 MCG PO TABS
175.0000 ug | ORAL_TABLET | Freq: Every day | ORAL | Status: DC
  Administered 2017-03-28 – 2017-04-02 (×6): 175 ug via ORAL
  Filled 2017-03-27 (×6): qty 1

## 2017-03-27 MED ORDER — ONDANSETRON HCL 4 MG/2ML IJ SOLN: 4.0000 mg | Freq: Four times a day (QID) | INTRAMUSCULAR | Status: DC | PRN

## 2017-03-27 MED ORDER — LEVETIRACETAM 500 MG PO TABS
500.0000 mg | ORAL_TABLET | Freq: Two times a day (BID) | ORAL | Status: DC
  Administered 2017-03-27 – 2017-04-02 (×12): 500 mg via ORAL
  Filled 2017-03-27 (×12): qty 1

## 2017-03-27 MED ORDER — DULOXETINE HCL 60 MG PO CPEP
60.0000 mg | ORAL_CAPSULE | Freq: Every day | ORAL | Status: DC
  Administered 2017-03-27 – 2017-04-01 (×6): 60 mg via ORAL
  Filled 2017-03-27 (×6): qty 1

## 2017-03-27 MED ORDER — AZITHROMYCIN 500 MG IV SOLR
INTRAVENOUS | Status: AC
  Administered 2017-03-27: 500 mg via INTRAVENOUS
  Filled 2017-03-27: qty 500

## 2017-03-27 MED ORDER — IPRATROPIUM-ALBUTEROL 0.5-2.5 (3) MG/3ML IN SOLN
3.0000 mL | Freq: Four times a day (QID) | RESPIRATORY_TRACT | Status: DC
  Administered 2017-03-27 – 2017-04-02 (×23): 3 mL via RESPIRATORY_TRACT
  Filled 2017-03-27 (×22): qty 3

## 2017-03-27 MED ORDER — ONDANSETRON HCL 4 MG PO TABS
4.0000 mg | ORAL_TABLET | Freq: Four times a day (QID) | ORAL | Status: DC | PRN
Start: 2017-03-27 — End: 2017-04-02

## 2017-03-27 MED ORDER — IPRATROPIUM-ALBUTEROL 0.5-2.5 (3) MG/3ML IN SOLN
RESPIRATORY_TRACT | Status: AC
  Filled 2017-03-27: qty 3

## 2017-03-27 MED ORDER — SUMATRIPTAN SUCCINATE 50 MG PO TABS
25.0000 mg | ORAL_TABLET | Freq: Once | ORAL | Status: DC | PRN
  Filled 2017-03-27: qty 1

## 2017-03-27 MED ORDER — FERROUS SULFATE 325 (65 FE) MG PO TABS
325.0000 mg | ORAL_TABLET | Freq: Two times a day (BID) | ORAL | Status: DC
  Administered 2017-03-27 – 2017-04-02 (×12): 325 mg via ORAL
  Filled 2017-03-27 (×12): qty 1

## 2017-03-27 MED ORDER — APIXABAN 5 MG PO TABS
5.0000 mg | ORAL_TABLET | Freq: Two times a day (BID) | ORAL | Status: DC
  Administered 2017-03-27 – 2017-04-02 (×12): 5 mg via ORAL
  Filled 2017-03-27 (×12): qty 1

## 2017-03-27 MED ORDER — SENNOSIDES-DOCUSATE SODIUM 8.6-50 MG PO TABS: 1.0000 | ORAL_TABLET | Freq: Every evening | ORAL | Status: DC | PRN

## 2017-03-27 MED ORDER — GLIPIZIDE 10 MG PO TABS
10.0000 mg | ORAL_TABLET | Freq: Every day | ORAL | Status: DC
  Administered 2017-03-28 – 2017-04-02 (×6): 10 mg via ORAL
  Filled 2017-03-27 (×6): qty 1

## 2017-03-27 MED ORDER — SODIUM CHLORIDE 0.9 % IV SOLN
500.0000 mg | INTRAVENOUS | Status: DC
  Administered 2017-03-27: 500 mg via INTRAVENOUS
  Filled 2017-03-27: qty 500

## 2017-03-27 MED ORDER — METFORMIN HCL 500 MG PO TABS
1000.0000 mg | ORAL_TABLET | Freq: Every day | ORAL | Status: DC
  Administered 2017-03-28 – 2017-04-02 (×6): 1000 mg via ORAL
  Filled 2017-03-27 (×6): qty 2

## 2017-03-27 MED ORDER — SUCRALFATE 1 G PO TABS
1.0000 g | ORAL_TABLET | Freq: Two times a day (BID) | ORAL | Status: DC
  Administered 2017-03-27 – 2017-04-02 (×12): 1 g via ORAL
  Filled 2017-03-27 (×12): qty 1

## 2017-03-27 MED ORDER — ACETAMINOPHEN 650 MG RE SUPP: 650.0000 mg | Freq: Four times a day (QID) | RECTAL | Status: DC | PRN

## 2017-03-27 MED ORDER — PANTOPRAZOLE SODIUM 40 MG PO TBEC
40.0000 mg | DELAYED_RELEASE_TABLET | Freq: Every day | ORAL | Status: DC
  Administered 2017-03-27 – 2017-04-02 (×7): 40 mg via ORAL
  Filled 2017-03-27 (×7): qty 1

## 2017-03-27 MED ORDER — METFORMIN HCL 500 MG PO TABS
500.0000 mg | ORAL_TABLET | Freq: Every day | ORAL | Status: DC
  Administered 2017-03-27 – 2017-04-01 (×6): 500 mg via ORAL
  Filled 2017-03-27 (×6): qty 1

## 2017-03-27 MED ORDER — LISINOPRIL 20 MG PO TABS
20.0000 mg | ORAL_TABLET | Freq: Every day | ORAL | Status: DC
  Administered 2017-03-27: 20 mg via ORAL
  Filled 2017-03-27: qty 1

## 2017-03-27 MED ORDER — SODIUM CHLORIDE 0.9 % IV SOLN
INTRAVENOUS | Status: DC
  Administered 2017-03-27 – 2017-03-28 (×2): via INTRAVENOUS

## 2017-03-27 NOTE — ED Triage Notes (Signed)
Pt arrived via ems from home with complaints of shortness of breath since yesterday. Pt received 2 duo nebs and 125mg  of solu medrol in route to ED. Upon arrival to ED pt remains short of breath with oxygen saturation of 91% on room air.

## 2017-03-27 NOTE — Progress Notes (Signed)
Pharmacy Antibiotic Note  Samantha Aguirre is a 72 y.o. female admitted on 03/27/2017 with COPD exacerbation.  Pharmacy has been consulted for ceftriaxone dosing.  Plan: Ceftriaxone 2g IV q24hr   Height: 5\' 1"  (154.9 cm) Weight: 189 lb (85.7 kg) IBW/kg (Calculated) : 47.8  Temp (24hrs), Avg:99.2 F (37.3 C), Min:99.2 F (37.3 C), Max:99.2 F (37.3 C)  Recent Labs  Lab 03/27/17 1244  WBC 5.8  LATICACIDVEN 2.5*    CrCl cannot be calculated (Patient's most recent lab result is older than the maximum 21 days allowed.).    Allergies  Allergen Reactions  . Mushroom Extract Complex Anaphylaxis    Made patient "deathly sick"  . Atenolol     "MD took me off of it bc it was doing something wrong" Made patient HYPOTENSIVE  . Ivp Dye [Iodinated Diagnostic Agents] Itching    Pt injected with IV contrast.  5 min after injection pt complained of itching behind ear and on her abd.  . Morphine And Related Other (See Comments)    "stops my breathing" NEAR-RESPIRATORY ARREST  . Percocet [Oxycodone-Acetaminophen] Nausea And Vomiting and Other (See Comments)    Stomach pains  . Percodan [Oxycodone-Aspirin] Nausea And Vomiting and Other (See Comments)    Stomach pains  . Verapamil Other (See Comments)    " MD told me this was screwing me up" MAKES PATIENT HYPOTENSIVE    Antimicrobials this admission: 3/15 Ceftriaxone >>   Microbiology results: 3/15 BCx: Sent   Thank you for allowing pharmacy to be a part of this patient's care.  Candelaria Stagers, PharmD Pharmacy Resident  03/27/2017 2:47 PM

## 2017-03-27 NOTE — H&P (Signed)
[facility] [place] NAME: Nani Ingram    MR#:  [id number]  DATE OF BIRTH:  Jan 26, 1945  DATE OF ADMISSION:  03/27/2017  PRIMARY CARE PHYSICIAN: Olin Hauser, DO   REQUESTING/REFERRING PHYSICIAN:   CHIEF COMPLAINT:   Chief Complaint  Patient presents with  . Shortness of Breath    HISTORY OF PRESENT ILLNESS: Evoleth Nordmeyer  is a 72 y.o. female with a known history of diastolic heart failure, bronchial asthma, atrial fibrillation, COPD, hyperlipidemia, diabetes mellitus, seizure disorder, skin cancer admitted to the emergency room with increased shortness of breath, cough and congestion for the last few days.  Patient also has some chills, body aches and a low-grade fever flu test was positive in the emergency room.  Patient was put on oxygen by nasal cannula for low oxygen saturationand stabilized and given Tamiflu.  No complaints of any chest pain.  Hospitalist service was consulted for further care.  PAST MEDICAL HISTORY:   Past Medical History:  Diagnosis Date  . Acid reflux   . Arthritis    Bilateral knees, hands, feet  . Asthma   . Atrial fibrillation ([place])   . Cancer ([place])   . Chest pain   . CHF (congestive heart failure) (HCC)    Diastolic CHF  . COPD (chronic obstructive pulmonary disease) ([place])   . Coronary artery disease   . Diabetes mellitus without complication ([place])   . Frequent headaches   . Heart attack ([place])   . High cholesterol   . HLD (hyperlipidemia)   . Hypertension   . MI (myocardial infarction) ([place])   . Seizures ([place])   . Skin cancer 2015   Suspected basal cell carcinoma on nose, surgically removed  . Stroke ([place])   . Thyroid disease   . Tremors of nervous system     PAST SURGICAL HISTORY:  Past Surgical History:  Procedure Laterality Date  . ABDOMINAL HYSTERECTOMY  1976  . APPENDECTOMY  1980  . CAROTID ENDARTERECTOMY    . CHOLECYSTECTOMY  1980  . COLONOSCOPY N/A 02/20/2017    Procedure: COLONOSCOPY;  Surgeon: Lin Landsman, MD;  Location: [facility] ENDOSCOPY;  Service: Gastroenterology;  Laterality: N/A;  . ESOPHAGOGASTRODUODENOSCOPY N/A 02/20/2017   Procedure: ESOPHAGOGASTRODUODENOSCOPY (EGD);  Surgeon: Lin Landsman, MD;  Location: [facility] ENDOSCOPY;  Service: Gastroenterology;  Laterality: N/A;  . [place]  . KNEE SURGERY     left  . ROTATOR CUFF REPAIR     right  . TONSILLECTOMY  1950    SOCIAL HISTORY:  Social History   Tobacco Use  . Smoking status: Current Every Day Smoker    Packs/day: 0.50    Years: 50.00    Pack years: 25.00    Types: Cigarettes  . Smokeless tobacco: Current User  Substance Use Topics  . Alcohol use: No    FAMILY HISTORY:  Family History  Problem Relation Age of Onset  . Breast cancer Mother   . Heart disease Mother   . Stroke Mother   . Cancer Mother   . COPD Mother   . Heart disease Father   . Diabetes Father   . Stroke Father   . Alcohol abuse Sister   . Drug abuse Sister   . Stroke Sister   . Cancer Sister   . Mental illness Sister   . Heart disease Brother   . Arthritis Brother   . Diabetes Brother   . Heart disease Maternal Grandfather   .  Heart disease Paternal Grandfather     DRUG ALLERGIES:  Allergies  Allergen Reactions  . Mushroom Extract Complex Anaphylaxis    Made patient "deathly sick"  . Atenolol     "MD took me off of it bc it was doing something wrong" Made patient HYPOTENSIVE  . Ivp Dye [Iodinated Diagnostic Agents] Itching    Pt injected with IV contrast.  5 min after injection pt complained of itching behind ear and on her abd.  . Morphine And Related Other (See Comments)    "stops my breathing" NEAR-RESPIRATORY ARREST  . Percocet [Oxycodone-Acetaminophen] Nausea And Vomiting and Other (See Comments)    Stomach pains  . Percodan [Oxycodone-Aspirin] Nausea And Vomiting and Other (See Comments)    Stomach pains  . Verapamil Other (See Comments)    " MD told  me this was screwing me up" MAKES PATIENT HYPOTENSIVE    REVIEW OF SYSTEMS:   CONSTITUTIONAL: Low grade fever, has fatigue and weakness.  EYES: No blurred or double vision.  EARS, NOSE, AND THROAT: No tinnitus or ear pain.  RESPIRATORY: Has cough, shortness of breath,  No wheezing or hemoptysis.  CARDIOVASCULAR: No chest pain, orthopnea, edema.  GASTROINTESTINAL: No nausea, vomiting, diarrhea or abdominal pain.  GENITOURINARY: No dysuria, hematuria.  ENDOCRINE: No polyuria, nocturia,  HEMATOLOGY: No anemia, easy bruising or bleeding SKIN: No rash or lesion. MUSCULOSKELETAL: No joint pain or arthritis.   NEUROLOGIC: No tingling, numbness, weakness.  PSYCHIATRY: No anxiety or depression.   MEDICATIONS AT HOME:  Prior to Admission medications   Medication Sig Start Date End Date Taking? Authorizing Provider  acetaminophen (TYLENOL) 325 MG tablet Take 650 mg by mouth 2 (two) times daily.    Yes [provider]  albuterol (PROVENTIL HFA;VENTOLIN HFA) 108 (90 Base) MCG/ACT inhaler Inhale 2 puffs into the lungs every 6 (six) hours as needed for wheezing or shortness of breath. 10/08/16  Yes Karamalegos, Devonne Doughty, DO  apixaban (ELIQUIS) 5 MG TABS tablet Take 5 mg by mouth every 12 (twelve) hours.   Yes [provider]  atorvastatin (LIPITOR) 80 MG tablet Take 1 tablet (80 mg total) by mouth daily at 6 PM. 09/24/16  Yes Karamalegos, Devonne Doughty, DO  diclofenac sodium (VOLTAREN) 1 % GEL Apply 2 g topically 4 (four) times daily. 01/11/16  Yes Daymon Larsen, MD  DULoxetine (CYMBALTA) 60 MG capsule Take 1 capsule (60 mg total) by mouth at bedtime. 09/24/16  Yes Karamalegos, Devonne Doughty, DO  ferrous sulfate 325 (65 FE) MG EC tablet Take 1 tablet (325 mg total) by mouth 2 (two) times daily with a meal. 02/21/17  Yes Sudini, Srikar, MD  fluticasone (FLONASE) 50 MCG/ACT nasal spray Place 1 spray into both nostrils daily. 10/08/16  Yes Karamalegos, Devonne Doughty, DO  furosemide (LASIX)  20 MG tablet Take 10 mg by mouth daily.    Yes Karamalegos, Devonne Doughty, DO  gabapentin (NEURONTIN) 800 MG tablet Take 1 tablet (800 mg total) by mouth 2 (two) times daily. 09/24/16  Yes Karamalegos, Devonne Doughty, DO  glipiZIDE-metformin (METAGLIP) 5-500 MG tablet Take by mouth 2 (two) times daily. 2 tablets every morning and 1 tablet at bedtime    Yes [provider]  levETIRAcetam (KEPPRA) 500 MG tablet Take 1 tablet (500 mg total) by mouth 2 (two) times daily. 09/19/15  Yes Epifanio Lesches, MD  levothyroxine (SYNTHROID, LEVOTHROID) 175 MCG tablet Take 1 tablet (175 mcg total) by mouth daily before breakfast. 09/24/16  Yes Parks Ranger, Devonne Doughty, DO  lisinopril (PRINIVIL,ZESTRIL) 20 MG tablet Take 1 tablet (20 mg total) by mouth daily. 10/08/16  Yes Karamalegos, Devonne Doughty, DO  nitroGLYCERIN (NITROSTAT) 0.4 MG SL tablet Place 1 tablet (0.4 mg total) under the tongue every 5 (five) minutes as needed for chest pain. 02/28/16  Yes Mody, Ulice Bold, MD  omeprazole (PRILOSEC) 20 MG capsule Take 20 mg by mouth 2 (two) times daily.   Yes [provider]  potassium chloride (MICRO-K) 10 MEQ CR capsule Take 1 capsule (10 mEq total) by mouth daily. 09/24/16  Yes Karamalegos, Devonne Doughty, DO  sucralfate (CARAFATE) 1 g tablet TAKE (1) TABLET BY MOUTH FOUR TIMES A DAY 03/26/17  Yes Karamalegos, Devonne Doughty, DO  SUMAtriptan (IMITREX) 25 MG tablet Take 1 tablet (25 mg total) by mouth once as needed for up to 1 dose for migraine. May repeat one dose in 2 hours if persists, max in 24 hrs 02/13/17  Yes Karamalegos, Devonne Doughty, DO  umeclidinium bromide (INCRUSE ELLIPTA) 62.5 MCG/INH AEPB Inhale 1 puff into the lungs daily. 11/13/15  Yes Karamalegos, Alexander J, DO  fluticasone furoate-vilanterol (BREO ELLIPTA) 100-25 MCG/INH AEPB Inhale 1 puff into the lungs daily. Patient not taking: Reported on 03/27/2017 11/13/15   Olin Hauser, DO  omeprazole (PRILOSEC) 20 MG capsule Take 1 capsule (20 mg  total) by mouth daily. Patient not taking: Reported on 03/27/2017 10/16/16   Olin Hauser, DO      PHYSICAL EXAMINATION:   VITAL SIGNS: Blood pressure (!) 126/53, pulse 90, temperature 99.2 F (37.3 C), temperature source Oral, resp. rate (!) 31, height 5\' 1"  (1.549 m), weight 85.7 kg (189 lb), SpO2 92 %.  GENERAL:  72 y.o.-year-old patient lying in the bed with no acute distress.  EYES: Pupils equal, round, reactive to light and accommodation. No scleral icterus. Extraocular muscles intact.  HEENT: Head atraumatic, normocephalic. Oropharynx dry and nasopharynx clear.  NECK:  Supple, no jugular venous distention. No thyroid enlargement, no tenderness.  LUNGS: Decreased breath sounds bilaterally, rales heard in both lungs. No use of accessory muscles of respiration.  CARDIOVASCULAR: S1, S2 normal. No murmurs, rubs, or gallops.  ABDOMEN: Soft, nontender, nondistended. Bowel sounds present. No organomegaly or mass.  EXTREMITIES: No pedal edema, cyanosis, or clubbing.  NEUROLOGIC: Cranial nerves II through XII are intact. Muscle strength 5/5 in all extremities. Sensation intact. Gait not checked.  PSYCHIATRIC: The patient is alert and oriented x 3.  SKIN: No obvious rash, lesion, or ulcer.   LABORATORY PANEL:   CBC Recent Labs  Lab 03/27/17 1244  WBC 5.8  HGB 11.0*  HCT 33.6*  PLT 178  MCV 81.4  MCH 26.7  MCHC 32.8  RDW 26.4*  LYMPHSABS 0.7*  MONOABS 0.6  EOSABS 0.0  BASOSABS 0.0   ------------------------------------------------------------------------------------------------------------------  Chemistries  No results for input(s): NA, K, CL, CO2, GLUCOSE, BUN, CREATININE, CALCIUM, MG, AST, ALT, ALKPHOS, BILITOT in the last 168 hours.  Invalid input(s): GFRCGP ------------------------------------------------------------------------------------------------------------------ CrCl cannot be calculated (Patient's most recent lab result is older than the maximum 21  days allowed.). ------------------------------------------------------------------------------------------------------------------ No results for input(s): TSH, T4TOTAL, T3FREE, THYROIDAB in the last 72 hours.  Invalid input(s): FREET3   Coagulation profile Recent Labs  Lab 03/27/17 1244  INR 1.11   ------------------------------------------------------------------------------------------------------------------- No results for input(s): DDIMER in the last 72 hours. -------------------------------------------------------------------------------------------------------------------  Cardiac Enzymes Recent Labs  Lab 03/27/17 1244  TROPONINI <0.03   ------------------------------------------------------------------------------------------------------------------ Invalid input(s): POCBNP  ---------------------------------------------------------------------------------------------------------------  Urinalysis    Component Value Date/Time   COLORURINE YELLOW (  A) 10/03/2016 1611   APPEARANCEUR CLEAR (A) 10/03/2016 1611   LABSPEC 1.020 10/03/2016 1611   PHURINE 6.0 10/03/2016 1611   GLUCOSEU NEGATIVE 10/03/2016 1611   HGBUR NEGATIVE 10/03/2016 1611   BILIRUBINUR NEGATIVE 10/03/2016 1611   KETONESUR NEGATIVE 10/03/2016 1611   PROTEINUR NEGATIVE 10/03/2016 1611   NITRITE NEGATIVE 10/03/2016 1611   LEUKOCYTESUR NEGATIVE 10/03/2016 1611     RADIOLOGY: Dg Chest Port 1 View  Result Date: 03/27/2017 CLINICAL DATA:  Shortness of breath and cough. EXAM: PORTABLE CHEST 1 VIEW COMPARISON:  02/18/2017 FINDINGS: The heart size and mediastinal contours are within normal limits. Aortic atherosclerosis. No effusions. Both lungs are clear. The visualized skeletal structures are unremarkable. IMPRESSION: No active disease. Aortic Atherosclerosis (ICD10-I70.0). Electronically Signed   By: Lorriane Shire M.D.   On: 03/27/2017 13:03    EKG: Orders placed or performed during the hospital  encounter of 03/27/17  . ED EKG  . ED EKG  . EKG 12-Lead  . EKG 12-Lead    IMPRESSION AND PLAN:  72 yr old female patient with history of diabetes mellitus, diastolic heart failure, hypertension, hyperlipidemia,atrial fibrillation vented with cough congestion fever and shortness of breath.  1.Flu syndrome Mid patient to medical floor inpatient service Oral Tamiflu Oxygen via nasal cannula  2.Hypoxia and dyspnea Continue oxygen via nasal cannula   3.Emphysema Nebulization treatments Start patient on IV Rocephin and Zithromax antibiotics  4. Atrial fibrillation Continue eliquis for anticoagulation   All the records are reviewed and case discussed with ED provider. Management plans discussed with the patient, family and they are in agreement.  CODE STATUS:FULL CODE Code Status History    Date Active Date Inactive Code Status Order ID Comments User Context   02/18/2017 16:23 02/21/2017 19:19 Full Code [id number]  Gorden Harms, MD Inpatient   10/03/2016 18:41 10/04/2016 17:39 Full Code [id number]  Demetrios Loll, MD Inpatient   02/27/2016 17:44 02/28/2016 20:34 Full Code [id number]  Vaughan Basta, MD Inpatient   02/19/2016 15:53 02/21/2016 20:41 Full Code [id number]  Gladstone Lighter, MD Inpatient   12/27/2015 13:09 01/01/2016 18:33 DNR [id number]  Loletha Grayer, MD ED   09/16/2015 18:52 09/19/2015 19:29 Full Code [id number]  Baxter Hire, MD Inpatient   08/16/2015 17:14 08/19/2015 17:35 DNR [id number]  Willia Craze, NP Inpatient       TOTAL TIME TAKING CARE OF THIS PATIENT: 55 minutes.    Saundra Shelling M.D on 03/27/2017 at 2:50 PM  Between 7am to 6pm - Pager - [phone]  After 6pm go to [email or web address] - password [email or web address] [facility]  Office  [phone]  CC: Primary care physician; Olin Hauser, DO

## 2017-03-27 NOTE — ED Provider Notes (Signed)
[facility] Emergency Department Provider Note  ____________________________________________   I have reviewed the triage vital signs and the nursing notes. Where available I have reviewed prior notes and, if possible and indicated, outside hospital notes.    HISTORY  Chief Complaint Shortness of Breath    HPI Samantha Aguirre is a 72 y.o. female street of COPD, atrial fibrillation, CHF COPD, who presents today complaining of shortness of breath which began a few days ago and got significantly worse.  She is always a little short of breath but it is much worse.  She does have a cough which is nonproductive.  She does have subjective fevers.  She denies chest pain or leg swelling.  EMS found her with significant hypoxia they gave HER-2 breathing treatments and put her on oxygen, and she did better.  Has orthopnea, she is concerned about her increasing shortness of breath.  Patient also received Solu-Medrol en route.  Past Medical History:  Diagnosis Date  . Acid reflux   . Arthritis    Bilateral knees, hands, feet  . Asthma   . Atrial fibrillation ([place])   . Cancer ([place])   . Chest pain   . CHF (congestive heart failure) (HCC)    Diastolic CHF  . COPD (chronic obstructive pulmonary disease) ([place])   . Coronary artery disease   . Diabetes mellitus without complication ([place])   . Frequent headaches   . Heart attack ([place])   . High cholesterol   . HLD (hyperlipidemia)   . Hypertension   . MI (myocardial infarction) ([place])   . Seizures ([place])   . Skin cancer 2015   Suspected basal cell carcinoma on nose, surgically removed  . Stroke ([place])   . Thyroid disease   . Tremors of nervous system     Patient Active Problem List   Diagnosis Date Noted  . Iron deficiency anemia due to chronic blood loss   . Migraine 02/13/2017  . Cataracts, bilateral 01/15/2017  . Chronic venous insufficiency 10/06/2016  . Recurrent major depressive disorder, in partial remission  ([place]) 10/06/2016  . Left-sided weakness 10/03/2016  . Chest pain 02/27/2016  . History of cerebrovascular accident (CVA) with residual deficit 02/19/2016  . Thumb pain, left 01/22/2016  . Traumatic closed nondisplaced fracture of base of metacarpal bone of left thumb 01/22/2016  . Colon cancer screening 11/27/2015  . Depression 11/14/2015  . Coronary artery disease 11/13/2015  . Hypertension 11/13/2015  . History of skin cancer 11/13/2015  . Osteoporosis 11/13/2015  . Chronic low back pain 11/13/2015  . Left medial knee pain 11/13/2015  . Osteoarthritis of multiple joints 11/13/2015  . Other specified hypothyroidism 08/16/2015  . Controlled type 2 diabetes mellitus with diabetic neuropathy ([place]) 08/16/2015  . COPD (chronic obstructive pulmonary disease) ([place]) 08/16/2015  . Tobacco abuse 08/16/2015  . HLD (hyperlipidemia) 08/16/2015  . History of CHF (congestive heart failure) 08/16/2015  . Seizures ([place])     Past Surgical History:  Procedure Laterality Date  . ABDOMINAL HYSTERECTOMY  1976  . APPENDECTOMY  1980  . CAROTID ENDARTERECTOMY    . CHOLECYSTECTOMY  1980  . COLONOSCOPY N/A 02/20/2017   Procedure: COLONOSCOPY;  Surgeon: Lin Landsman, MD;  Location: [facility] ENDOSCOPY;  Service: Gastroenterology;  Laterality: N/A;  . ESOPHAGOGASTRODUODENOSCOPY N/A 02/20/2017   Procedure: ESOPHAGOGASTRODUODENOSCOPY (EGD);  Surgeon: Lin Landsman, MD;  Location: [facility] ENDOSCOPY;  Service: Gastroenterology;  Laterality: N/A;  . [place]  . KNEE SURGERY     left  .  ROTATOR CUFF REPAIR     right  . TONSILLECTOMY  1950    Prior to Admission medications   Medication Sig Start Date End Date Taking? Authorizing Provider  albuterol (PROVENTIL HFA;VENTOLIN HFA) 108 (90 Base) MCG/ACT inhaler Inhale 2 puffs into the lungs every 6 (six) hours as needed for wheezing or shortness of breath. 10/08/16  Yes Karamalegos, Devonne Doughty, DO  apixaban (ELIQUIS) 5 MG TABS tablet Take 5 mg  by mouth every 12 (twelve) hours.   Yes [provider]  atorvastatin (LIPITOR) 80 MG tablet Take 1 tablet (80 mg total) by mouth daily at 6 PM. 09/24/16  Yes Karamalegos, Devonne Doughty, DO  DULoxetine (CYMBALTA) 60 MG capsule Take 1 capsule (60 mg total) by mouth at bedtime. 09/24/16  Yes Karamalegos, Alexander J, DO  fluticasone furoate-vilanterol (BREO ELLIPTA) 100-25 MCG/INH AEPB Inhale 1 puff into the lungs daily. Patient taking differently: Inhale 1 puff into the lungs daily as needed. For shortness of breath 11/13/15  Yes Karamalegos, Devonne Doughty, DO  gabapentin (NEURONTIN) 800 MG tablet Take 1 tablet (800 mg total) by mouth 2 (two) times daily. 09/24/16  Yes Karamalegos, Devonne Doughty, DO  levETIRAcetam (KEPPRA) 500 MG tablet Take 1 tablet (500 mg total) by mouth 2 (two) times daily. 09/19/15  Yes Epifanio Lesches, MD  levothyroxine (SYNTHROID, LEVOTHROID) 175 MCG tablet Take 1 tablet (175 mcg total) by mouth daily before breakfast. 09/24/16  Yes Karamalegos, Devonne Doughty, DO  lisinopril (PRINIVIL,ZESTRIL) 20 MG tablet Take 1 tablet (20 mg total) by mouth daily. 10/08/16  Yes Karamalegos, Devonne Doughty, DO  omeprazole (PRILOSEC) 20 MG capsule Take 1 capsule (20 mg total) by mouth daily. 10/16/16  Yes Karamalegos, Devonne Doughty, DO  potassium chloride (MICRO-K) 10 MEQ CR capsule Take 1 capsule (10 mEq total) by mouth daily. 09/24/16  Yes Karamalegos, Devonne Doughty, DO  acetaminophen (TYLENOL) 325 MG tablet Take 650 mg by mouth 2 (two) times daily.     [provider]  diclofenac sodium (VOLTAREN) 1 % GEL Apply 2 g topically 4 (four) times daily. 01/11/16   Daymon Larsen, MD  ferrous sulfate 325 (65 FE) MG EC tablet Take 1 tablet (325 mg total) by mouth 2 (two) times daily with a meal. Patient not taking: Reported on 02/23/2017 02/21/17   Hillary Bow, MD  fluticasone (FLONASE) 50 MCG/ACT nasal spray Place 1 spray into both nostrils daily. 10/08/16   Karamalegos, Devonne Doughty, DO  furosemide  (LASIX) 20 MG tablet Take 10 mg by mouth daily.     Karamalegos, Alexander J, DO  glipiZIDE-metformin (METAGLIP) 5-500 MG tablet Take by mouth 2 (two) times daily. 2 tablets every morning and 1 tablet at bedtime     [provider]  nitroGLYCERIN (NITROSTAT) 0.4 MG SL tablet Place 1 tablet (0.4 mg total) under the tongue every 5 (five) minutes as needed for chest pain. Patient not taking: Reported on 03/10/2017 02/28/16   Bettey Costa, MD  sucralfate (CARAFATE) 1 g tablet TAKE (1) TABLET BY MOUTH FOUR TIMES A DAY 03/26/17   Karamalegos, Devonne Doughty, DO  SUMAtriptan (IMITREX) 25 MG tablet Take 1 tablet (25 mg total) by mouth once as needed for up to 1 dose for migraine. May repeat one dose in 2 hours if persists, max in 24 hrs 02/13/17   Karamalegos, Devonne Doughty, DO  umeclidinium bromide (INCRUSE ELLIPTA) 62.5 MCG/INH AEPB Inhale 1 puff into the lungs daily. Patient not taking: Reported on 03/10/2017 11/13/15   Olin Hauser, DO  Allergies Mushroom extract complex; Atenolol; Ivp dye [iodinated diagnostic agents]; Morphine and related; Percocet [oxycodone-acetaminophen]; Percodan [oxycodone-aspirin]; and Verapamil  Family History  Problem Relation Age of Onset  . Breast cancer Mother   . Heart disease Mother   . Stroke Mother   . Cancer Mother   . COPD Mother   . Heart disease Father   . Diabetes Father   . Stroke Father   . Alcohol abuse Sister   . Drug abuse Sister   . Stroke Sister   . Cancer Sister   . Mental illness Sister   . Heart disease Brother   . Arthritis Brother   . Diabetes Brother   . Heart disease Maternal Grandfather   . Heart disease Paternal Grandfather     Social History Social History   Tobacco Use  . Smoking status: Current Every Day Smoker    Packs/day: 0.50    Years: 50.00    Pack years: 25.00    Types: Cigarettes  . Smokeless tobacco: Current User  Substance Use Topics  . Alcohol use: No  . Drug use: No    Review of  Systems Constitutional: No fever/chills Eyes: No visual changes. ENT: No sore throat. No stiff neck no neck pain Cardiovascular: Denies chest pain. Respiratory: See HPI Gastrointestinal:   no vomiting.  No diarrhea.  No constipation. Genitourinary: Negative for dysuria. Musculoskeletal: Negative lower extremity swelling Skin: Negative for rash. Neurological: Negative for severe headaches, focal weakness or numbness.   ____________________________________________   PHYSICAL EXAM:  VITAL SIGNS: ED Triage Vitals  Enc Vitals Group     BP 03/27/17 1230 (!) 122/53     Pulse Rate 03/27/17 1230 90     Resp 03/27/17 1230 17     Temp 03/27/17 1232 99.2 F (37.3 C)     Temp Source 03/27/17 1232 Oral     SpO2 03/27/17 1230 91 %     Weight 03/27/17 1234 189 lb (85.7 kg)     Height 03/27/17 1234 '5\' 1"'  (1.549 m)     Head Circumference --      Peak Flow --      Pain Score 03/27/17 1234 6     Pain Loc --      Pain Edu? --      Excl. in [place]? --     Constitutional: appears to be unwell but not toxic,. Eyes: Conjunctivae are normal Head: Atraumatic HEENT: No congestion/rhinnorhea. Mucous membranes are moist.  Oropharynx non-erythematous Neck:   Nontender with no meningismus, no masses, no stridor Cardiovascular: Normal rate, regular rhythm. Grossly normal heart sounds.  Good peripheral circulation. Respiratory: Normal respiratory effort.  No retractions.  Diminished in the bases Abdominal: Soft and nontender. No distention. No guarding no rebound Back:  There is no focal tenderness or step off.  there is no midline tenderness there are no lesions noted. there is no CVA tenderness Musculoskeletal: No lower extremity tenderness, no upper extremity tenderness. No joint effusions, no DVT signs strong distal pulses no edema Neurologic:  Normal speech and language. No gross focal neurologic deficits are appreciated.  Skin:  Skin is warm, dry and intact. No rash noted. Psychiatric: Mood and  affect are normal. Speech and behavior are normal.  ____________________________________________   LABS (all labs ordered are listed, but only abnormal results are displayed)  Labs Reviewed  CBC WITH DIFFERENTIAL/PLATELET - Abnormal; Notable for the following components:      Result Value   Hemoglobin 11.0 (*)    HCT 33.6 (*)  RDW 26.4 (*)    Lymphs Abs 0.7 (*)    All other components within normal limits  INFLUENZA PANEL BY PCR (TYPE A & B) - Abnormal; Notable for the following components:   Influenza A By PCR POSITIVE (*)    All other components within normal limits  CULTURE, BLOOD (ROUTINE X 2)  CULTURE, BLOOD (ROUTINE X 2)  PROTIME-INR  TROPONIN I  BRAIN NATRIURETIC PEPTIDE  LACTIC ACID, PLASMA  LACTIC ACID, PLASMA    Pertinent labs  results that were available during my care of the patient were reviewed by me and considered in my medical decision making (see chart for details). ____________________________________________  EKG  I personally interpreted any EKGs ordered by me or triage Sinus rate 97, nonspecific ST changes, poor R wave progression, old anterior infarct, normal axis. ____________________________________________  RADIOLOGY  Pertinent labs & imaging results that were available during my care of the patient were reviewed by me and considered in my medical decision making (see chart for details). If possible, patient and/or family made aware of any abnormal findings.  Dg Chest Port 1 View  Result Date: 03/27/2017 CLINICAL DATA:  Shortness of breath and cough. EXAM: PORTABLE CHEST 1 VIEW COMPARISON:  02/18/2017 FINDINGS: The heart size and mediastinal contours are within normal limits. Aortic atherosclerosis. No effusions. Both lungs are clear. The visualized skeletal structures are unremarkable. IMPRESSION: No active disease. Aortic Atherosclerosis (ICD10-I70.0). Electronically Signed   By: Lorriane Shire M.D.   On: 03/27/2017 13:03    ____________________________________________    PROCEDURES  Procedure(s) performed: None  Procedures  Critical Care performed: None  ____________________________________________   INITIAL IMPRESSION / ASSESSMENT AND PLAN / ED COURSE  Pertinent labs & imaging results that were available during my care of the patient were reviewed by me and considered in my medical decision making (see chart for details).  Patient here with hypoxia and cough.  She has already received steroids and duo nebs.  She is feeling slightly better afterwards however she still has low sats.  She has a host of medical problems all of which could possibly contribute to this, but I think today's principal problem is that she has the flu.  White count is reassuring troponin is reassuring, chest x-ray is reassuring but given that she is hypoxic and has baseline COPD we will admit for further observation    ____________________________________________   FINAL CLINICAL IMPRESSION(S) / ED DIAGNOSES  Final diagnoses:  Cough  SOB (shortness of breath)  Influenza  Hypoxia      This chart was dictated using voice recognition software.  Despite best efforts to proofread,  errors can occur which can change meaning.      Schuyler Amor, MD 03/27/17 1355

## 2017-03-27 NOTE — Progress Notes (Signed)
Patient was admitted to room 158 from ED. On 2L O2. Isolation initiated. Transferred with SBA. A&O x4. Incont of urine. Oriented to room, call light, TV and bed controls. Bed alarm on for safety. IV fluids started.

## 2017-03-28 LAB — GLUCOSE, CAPILLARY
GLUCOSE-CAPILLARY: 66 mg/dL (ref 65–99)
GLUCOSE-CAPILLARY: 92 mg/dL (ref 65–99)
Glucose-Capillary: 127 mg/dL — ABNORMAL HIGH (ref 65–99)
Glucose-Capillary: 67 mg/dL (ref 65–99)
Glucose-Capillary: 179 mg/dL — ABNORMAL HIGH (ref 65–99)
Glucose-Capillary: 168 mg/dL — ABNORMAL HIGH (ref 65–99)

## 2017-03-28 LAB — BASIC METABOLIC PANEL
Anion gap: 8 (ref 5–15)
BUN: 21 mg/dL — ABNORMAL HIGH (ref 6–20)
CALCIUM: 8.1 mg/dL — AB (ref 8.9–10.3)
CO2: 24 mmol/L (ref 22–32)
CREATININE: 0.8 mg/dL (ref 0.44–1.00)
Chloride: 107 mmol/L (ref 101–111)
GFR calc non Af Amer: >60 mL/min (ref >60)
GLUCOSE: 195 mg/dL — AB (ref 65–99)
Potassium: 3.2 mmol/L — ABNORMAL LOW (ref 3.5–5.1)
Sodium: 139 mmol/L (ref 135–145)

## 2017-03-28 LAB — CBC
HCT: 31.1 % — ABNORMAL LOW (ref 35.0–47.0)
HEMOGLOBIN: 10.2 g/dL — AB (ref 12.0–16.0)
MCH: 26.7 pg (ref 26.0–34.0)
MCHC: 32.6 g/dL (ref 32.0–36.0)
MCV: 81.8 fL (ref 80.0–100.0)
PLATELETS: 165 10*3/uL (ref 150–440)
RBC: 3.8 MIL/uL (ref 3.80–5.20)
RDW: 25.9 % — ABNORMAL HIGH (ref 11.5–14.5)
WBC: 8.7 10*3/uL (ref 3.6–11.0)

## 2017-03-28 MED ORDER — BUDESONIDE 0.5 MG/2ML IN SUSP
0.5000 mg | Freq: Two times a day (BID) | RESPIRATORY_TRACT | Status: DC
  Administered 2017-03-28 – 2017-04-02 (×11): 0.5 mg via RESPIRATORY_TRACT
  Filled 2017-03-28 (×11): qty 2

## 2017-03-28 MED ORDER — INSULIN ASPART 100 UNIT/ML ~~LOC~~ SOLN
0.0000 [IU] | Freq: Three times a day (TID) | SUBCUTANEOUS | Status: DC
  Administered 2017-03-28 – 2017-03-29 (×2): 2 [IU] via SUBCUTANEOUS
  Administered 2017-03-29: 3 [IU] via SUBCUTANEOUS
  Administered 2017-03-29 – 2017-03-31 (×5): 2 [IU] via SUBCUTANEOUS
  Administered 2017-03-31 – 2017-04-01 (×2): 3 [IU] via SUBCUTANEOUS
  Administered 2017-04-01 – 2017-04-02 (×3): 2 [IU] via SUBCUTANEOUS
  Filled 2017-03-28 (×13): qty 1

## 2017-03-28 MED ORDER — POTASSIUM CHLORIDE CRYS ER 20 MEQ PO TBCR
40.0000 meq | EXTENDED_RELEASE_TABLET | Freq: Once | ORAL | Status: AC
  Administered 2017-03-28: 40 meq via ORAL
  Filled 2017-03-28: qty 2

## 2017-03-28 MED ORDER — GUAIFENESIN-DM 100-10 MG/5ML PO SYRP
5.0000 mL | ORAL_SOLUTION | ORAL | Status: DC | PRN
  Administered 2017-03-28 – 2017-03-30 (×3): 5 mL via ORAL
  Filled 2017-03-28 (×3): qty 5

## 2017-03-28 MED ORDER — METHYLPREDNISOLONE SODIUM SUCC 40 MG IJ SOLR
40.0000 mg | Freq: Two times a day (BID) | INTRAMUSCULAR | Status: DC
  Administered 2017-03-28 – 2017-04-02 (×11): 40 mg via INTRAVENOUS
  Filled 2017-03-28 (×11): qty 1

## 2017-03-28 MED ORDER — INSULIN ASPART 100 UNIT/ML ~~LOC~~ SOLN
0.0000 [IU] | Freq: Every day | SUBCUTANEOUS | Status: DC
  Administered 2017-03-28: 0 [IU] via SUBCUTANEOUS

## 2017-03-28 NOTE — Progress Notes (Signed)
Patient ID: Samantha Aguirre, female   DOB: 03/31/1945, 72 y.o.   MRN: [id number]  [employer] PROGRESS NOTE  Samantha Aguirre TOI:[id number] DOB: 1945/02/07 DOA: 03/27/2017 PCP: Olin Hauser, DO  HPI/Subjective: Patient feeling worse today than yesterday.  Short of breath coughing and weakness.  Objective: Vitals:   03/28/17 0821 03/28/17 1328  BP: (!) 108/57   Pulse: 71   Resp: 18   Temp: 98.6 F (37 C)   SpO2: 96% 96%    Filed Weights   03/27/17 1234 03/27/17 1648  Weight: 85.7 kg (189 lb) 87.1 kg (192 lb)    ROS: Review of Systems  Constitutional: Negative for chills and fever.  Eyes: Negative for blurred vision.  Respiratory: Positive for cough, shortness of breath and wheezing.   Cardiovascular: Negative for chest pain.  Gastrointestinal: Negative for abdominal pain, constipation, diarrhea, nausea and vomiting.  Genitourinary: Negative for dysuria.  Musculoskeletal: Negative for joint pain.  Neurological: Negative for dizziness and headaches.   Exam: Physical Exam  Constitutional: She is oriented to person, place, and time.  HENT:  Nose: No mucosal edema.  Mouth/Throat: No oropharyngeal exudate or posterior oropharyngeal edema.  Eyes: Conjunctivae, EOM and lids are normal. Pupils are equal, round, and reactive to light.  Neck: No JVD present. Carotid bruit is not present. No edema present. No thyroid mass and no thyromegaly present.  Cardiovascular: S1 normal and S2 normal. Exam reveals no gallop.  No murmur heard. Pulses:      Dorsalis pedis pulses are 2+ on the right side, and 2+ on the left side.  Respiratory: No respiratory distress. She has decreased breath sounds in the right middle field, the right lower field, the left middle field and the left lower field. She has no wheezes. She has rhonchi in the right middle field, the right lower field, the left middle field and the left lower field. She has no rales.  GI: Soft. Bowel sounds are  normal. There is no tenderness.  Musculoskeletal:       Right ankle: She exhibits no swelling.       Left ankle: She exhibits no swelling.  Lymphadenopathy:    She has no cervical adenopathy.  Neurological: She is alert and oriented to person, place, and time. No cranial nerve deficit.  Skin: Skin is warm. No rash noted. Nails show no clubbing.  Psychiatric: She has a normal mood and affect.      Data Reviewed: Basic Metabolic Panel: Recent Labs  Lab 03/28/17 0417  NA 139  K 3.2*  CL 107  CO2 24  GLUCOSE 195*  BUN 21*  CREATININE 0.80  CALCIUM 8.1*   CBC: Recent Labs  Lab 03/27/17 1244 03/28/17 0417  WBC 5.8 8.7  NEUTROABS 4.5  --   HGB 11.0* 10.2*  HCT 33.6* 31.1*  MCV 81.4 81.8  PLT 178 165   Cardiac Enzymes: Recent Labs  Lab 03/27/17 1244  TROPONINI <0.03   BNP (last 3 results) Recent Labs    10/03/16 1436 03/27/17 1244  BNP 36.0 133.0*    CBG: Recent Labs  Lab 03/27/17 2126 03/28/17 0806 03/28/17 1156 03/28/17 1231 03/28/17 1248  GLUCAP 166* 127* 67 66 92    Recent Results (from the past 240 hour(s))  Culture, blood (routine x 2)     Status: None (Preliminary result)   Collection Time: 03/27/17 12:44 PM  Result Value Ref Range Status   Specimen Description BLOOD LAC  Final   Special Requests   Final  BOTTLES DRAWN AEROBIC AND ANAEROBIC Blood Culture adequate volume   Culture   Final    NO GROWTH < 24 HOURS Performed at [facility], [address] [place], [place] [address]    Report Status PENDING  Incomplete  Culture, blood (routine x 2)     Status: None (Preliminary result)   Collection Time: 03/27/17 12:44 PM  Result Value Ref Range Status   Specimen Description BLOOD RAC  Final   Special Requests   Final    BOTTLES DRAWN AEROBIC AND ANAEROBIC Blood Culture adequate volume   Culture   Final    NO GROWTH < 24 HOURS Performed at [facility], [address] [place], [place] [address]    Report Status  PENDING  Incomplete     Studies: Dg Chest Port 1 View  Result Date: 03/27/2017 CLINICAL DATA:  Shortness of breath and cough. EXAM: PORTABLE CHEST 1 VIEW COMPARISON:  02/18/2017 FINDINGS: The heart size and mediastinal contours are within normal limits. Aortic atherosclerosis. No effusions. Both lungs are clear. The visualized skeletal structures are unremarkable. IMPRESSION: No active disease. Aortic Atherosclerosis (ICD10-I70.0). Electronically Signed   By: Lorriane Shire M.D.   On: 03/27/2017 13:03    Scheduled Meds: . apixaban  5 mg Oral Q12H  . atorvastatin  80 mg Oral q1800  . budesonide (PULMICORT) nebulizer solution  0.5 mg Nebulization BID  . DULoxetine  60 mg Oral QHS  . ferrous sulfate  325 mg Oral BID WC  . fluticasone  1 spray Each Nare Daily  . gabapentin  800 mg Oral BID  . glipiZIDE  10 mg Oral QAC breakfast   And  . metFORMIN  1,000 mg Oral Q breakfast  . glipiZIDE  5 mg Oral Q supper   And  . metFORMIN  500 mg Oral Q supper  . insulin aspart  0-9 Units Subcutaneous TID WC  . ipratropium-albuterol  3 mL Nebulization Q6H  . levETIRAcetam  500 mg Oral BID  . levothyroxine  175 mcg Oral QAC breakfast  . methylPREDNISolone (SOLU-MEDROL) injection  40 mg Intravenous Q12H  . oseltamivir  75 mg Oral BID  . pantoprazole  40 mg Oral Daily  . sucralfate  1 g Oral BID   Continuous Infusions:  Assessment/Plan:  1. Acute hypoxic respiratory failure.  Pulse ox 89% on room air.  Check pulse ox on room air.  Continue oxygen supplementation to keep oxygen above 92%. 2. COPD exacerbation.  Start IV Solu-Medrol, budesonide and DuoNeb nebulizer solution 3. Influenza A infection on Tamiflu.  Discontinue antibiotics. 4. Type 2 diabetes mellitus.  Sliding scale insulin plus oral medications.  Send off a hemoglobin A1c. 5. History of atrial fibrillation and stroke on Eliquis 6. Hyperlipidemia unspecified on atorvastatin 7. History of seizure on Keppra 8. Hypothyroidism unspecified  on levothyroxine 9. GERD on Carafate and Protonix 10. Weakness physical therapy evaluation  Code Status:     Code Status Orders  (From admission, onward)        Start     Ordered   03/27/17 1520  Full code  Continuous     03/27/17 1519    Code Status History    Date Active Date Inactive Code Status Order ID Comments User Context   02/18/2017 16:23 02/21/2017 19:19 Full Code [id number]  Gorden Harms, MD Inpatient   10/03/2016 18:41 10/04/2016 17:39 Full Code [id number]  Demetrios Loll, MD Inpatient   02/27/2016 17:44 02/28/2016 20:34 Full Code [id number]  Vaughan Basta, MD  Inpatient   02/19/2016 15:53 02/21/2016 20:41 Full Code [id number]  Gladstone Lighter, MD Inpatient   12/27/2015 13:09 01/01/2016 18:33 DNR [id number]  Loletha Grayer, MD ED   09/16/2015 18:52 09/19/2015 19:29 Full Code [id number]  Baxter Hire, MD Inpatient   08/16/2015 17:14 08/19/2015 17:35 DNR [id number]  Willia Craze, NP Inpatient     Disposition Plan: Needs to be moving better air and off oxygen prior to disposition  Antibiotics:  Tamiflu  Time spent: 28 minutes  Emma

## 2017-03-28 NOTE — Progress Notes (Signed)
SATURATION QUALIFICATIONS:  Patient Saturations on Room Air at Rest = 90%  Patient Saturations on Room Air while Ambulating = 87%  Patient Saturations on 2 Liters of oxygen while Ambulating = 94%  Please briefly explain why patient needs home oxygen: Pt desats with ambulation
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
## 2017-03-28 NOTE — Care Management (Signed)
Patient from home.  Has had [facility] home health in the past.  Will reach out to agency to see if patient is current. Have requested home 02 assessment and made [facility] referral

## 2017-03-29 LAB — GLUCOSE, CAPILLARY
GLUCOSE-CAPILLARY: 180 mg/dL — AB (ref 65–99)
GLUCOSE-CAPILLARY: 205 mg/dL — AB (ref 65–99)
Glucose-Capillary: 189 mg/dL — ABNORMAL HIGH (ref 65–99)
Glucose-Capillary: 176 mg/dL — ABNORMAL HIGH (ref 65–99)
Glucose-Capillary: 112 mg/dL — ABNORMAL HIGH (ref 65–99)

## 2017-03-29 LAB — HEMOGLOBIN A1C
Hgb A1c MFr Bld: 6.2 % — ABNORMAL HIGH (ref 4.8–5.6)
Mean Plasma Glucose: 131.24 mg/dL

## 2017-03-29 MED ORDER — BACLOFEN 10 MG PO TABS
10.0000 mg | ORAL_TABLET | Freq: Three times a day (TID) | ORAL | Status: DC
  Administered 2017-03-29 – 2017-04-02 (×14): 10 mg via ORAL
  Filled 2017-03-29 (×14): qty 1

## 2017-03-29 NOTE — Progress Notes (Signed)
Patient ID: Samantha Aguirre, female   DOB: 1945/02/04, 72 y.o.   MRN: [id number]  [employer] PROGRESS NOTE  Samantha Aguirre WUJ:[id number] DOB: 1945/10/04 DOA: 03/27/2017 PCP: Olin Hauser, DO  HPI/Subjective: Still not feeling well. Some shortness of breath, cough and wheeze still hearing audible wheeze.  Patient still feeling weak.  Objective: Vitals:   03/29/17 1228 03/29/17 1256  BP:    Pulse:    Resp:    Temp:    SpO2: 94% 95%    Filed Weights   03/27/17 1234 03/27/17 1648  Weight: 85.7 kg (189 lb) 87.1 kg (192 lb)    ROS: Review of Systems  Constitutional: Negative for chills and fever.  Eyes: Negative for blurred vision.  Respiratory: Positive for cough, shortness of breath and wheezing.   Cardiovascular: Negative for chest pain.  Gastrointestinal: Negative for abdominal pain, constipation, diarrhea, nausea and vomiting.  Genitourinary: Negative for dysuria.  Musculoskeletal: Negative for joint pain.  Neurological: Positive for weakness. Negative for dizziness and headaches.   Exam: Physical Exam  Constitutional: She is oriented to person, place, and time.  HENT:  Nose: No mucosal edema.  Mouth/Throat: No oropharyngeal exudate or posterior oropharyngeal edema.  Eyes: Conjunctivae, EOM and lids are normal. Pupils are equal, round, and reactive to light.  Neck: No JVD present. Carotid bruit is not present. No edema present. No thyroid mass and no thyromegaly present.  Cardiovascular: S1 normal and S2 normal. Exam reveals no gallop.  No murmur heard. Pulses:      Dorsalis pedis pulses are 2+ on the right side, and 2+ on the left side.  Respiratory: No respiratory distress. She has decreased breath sounds in the right middle field, the right lower field, the left middle field and the left lower field. She has no wheezes. She has rhonchi in the right middle field, the right lower field, the left middle field and the left lower field. She has no rales.   GI: Soft. Bowel sounds are normal. There is no tenderness.  Musculoskeletal:       Right ankle: She exhibits no swelling.       Left ankle: She exhibits no swelling.  Lymphadenopathy:    She has no cervical adenopathy.  Neurological: She is alert and oriented to person, place, and time. No cranial nerve deficit.  Skin: Skin is warm. No rash noted. Nails show no clubbing.  Psychiatric: She has a normal mood and affect.      Data Reviewed: Basic Metabolic Panel: Recent Labs  Lab 03/28/17 0417  NA 139  K 3.2*  CL 107  CO2 24  GLUCOSE 195*  BUN 21*  CREATININE 0.80  CALCIUM 8.1*   CBC: Recent Labs  Lab 03/27/17 1244 03/28/17 0417  WBC 5.8 8.7  NEUTROABS 4.5  --   HGB 11.0* 10.2*  HCT 33.6* 31.1*  MCV 81.4 81.8  PLT 178 165   Cardiac Enzymes: Recent Labs  Lab 03/27/17 1244  TROPONINI <0.03   BNP (last 3 results) Recent Labs    10/03/16 1436 03/27/17 1244  BNP 36.0 133.0*    CBG: Recent Labs  Lab 03/28/17 1636 03/28/17 2113 03/29/17 0616 03/29/17 0723 03/29/17 1128  GLUCAP 179* 168* 189* 176* 180*    Recent Results (from the past 240 hour(s))  Culture, blood (routine x 2)     Status: None (Preliminary result)   Collection Time: 03/27/17 12:44 PM  Result Value Ref Range Status   Specimen Description BLOOD LAC  Final  Special Requests   Final    BOTTLES DRAWN AEROBIC AND ANAEROBIC Blood Culture adequate volume   Culture   Final    NO GROWTH 2 DAYS Performed at [facility], [address] [place], [place] [address]    Report Status PENDING  Incomplete  Culture, blood (routine x 2)     Status: None (Preliminary result)   Collection Time: 03/27/17 12:44 PM  Result Value Ref Range Status   Specimen Description BLOOD RAC  Final   Special Requests   Final    BOTTLES DRAWN AEROBIC AND ANAEROBIC Blood Culture adequate volume   Culture   Final    NO GROWTH 2 DAYS Performed at [facility], [address] [place], [place]  [address]    Report Status PENDING  Incomplete      Scheduled Meds: . apixaban  5 mg Oral Q12H  . atorvastatin  80 mg Oral q1800  . baclofen  10 mg Oral TID  . budesonide (PULMICORT) nebulizer solution  0.5 mg Nebulization BID  . DULoxetine  60 mg Oral QHS  . ferrous sulfate  325 mg Oral BID WC  . fluticasone  1 spray Each Nare Daily  . gabapentin  800 mg Oral BID  . glipiZIDE  10 mg Oral QAC breakfast   And  . metFORMIN  1,000 mg Oral Q breakfast  . glipiZIDE  5 mg Oral Q supper   And  . metFORMIN  500 mg Oral Q supper  . insulin aspart  0-5 Units Subcutaneous QHS  . insulin aspart  0-9 Units Subcutaneous TID WC  . ipratropium-albuterol  3 mL Nebulization Q6H  . levETIRAcetam  500 mg Oral BID  . levothyroxine  175 mcg Oral QAC breakfast  . methylPREDNISolone (SOLU-MEDROL) injection  40 mg Intravenous Q12H  . oseltamivir  75 mg Oral BID  . pantoprazole  40 mg Oral Daily  . sucralfate  1 g Oral BID    Assessment/Plan:  1. Acute hypoxic respiratory failure.  This has improved and patient is now on room air at rest. 2. COPD exacerbation.   continue IV Solu-Medrol, budesonide and DuoNeb nebulizer solution.  Patient still wheezing quite a bit.  Will need to move better air prior to disposition. 3. Influenza A infection on Tamiflu.  4. Type 2 diabetes mellitus.  Sliding scale insulin plus oral medications.  Hemoglobin A1c actually 6.2 which is on the lower side.  May be able to get off the glipizide once off steroids. 5. History of atrial fibrillation and stroke on Eliquis 6. Hyperlipidemia unspecified on atorvastatin 7. History of seizure on Keppra 8. Hypothyroidism unspecified on levothyroxine 9. GERD on Carafate and Protonix 10. Weakness physical therapy evaluation recommends home with home health and rolling walker  Code Status:     Code Status Orders  (From admission, onward)        Start     Ordered   03/27/17 1520  Full code  Continuous     03/27/17 1519    Code  Status History    Date Active Date Inactive Code Status Order ID Comments User Context   02/18/2017 16:23 02/21/2017 19:19 Full Code [id number]  Gorden Harms, MD Inpatient   10/03/2016 18:41 10/04/2016 17:39 Full Code [id number]  Demetrios Loll, MD Inpatient   02/27/2016 17:44 02/28/2016 20:34 Full Code [id number]  Vaughan Basta, MD Inpatient   02/19/2016 15:53 02/21/2016 20:41 Full Code [id number]  Gladstone Lighter, MD Inpatient   12/27/2015 13:09 01/01/2016 18:33 DNR  [id number]  Loletha Grayer, MD ED   09/16/2015 18:52 09/19/2015 19:29 Full Code [id number]  Baxter Hire, MD Inpatient   08/16/2015 17:14 08/19/2015 17:35 DNR [id number]  Willia Craze, NP Inpatient     Disposition Plan: Needs to be moving better air and off oxygen prior to disposition  Antibiotics:  Tamiflu  Time spent: 25 minutes  [place]

## 2017-03-29 NOTE — Progress Notes (Signed)
Went to patient's room for bedside reporting with oncoming RN. Patient noted reaching on the floor for a menu she dropped and balancing her upper body on the bedside table unable to pull her upper body off the table. Bed alarm was on the standing position and functioning properly. Patient assisted in a comfortable potition bed and agreed to call for staff if dropping items on the floor. Bed alarm adjusted to a middle setting.

## 2017-03-30 LAB — GLUCOSE, CAPILLARY
GLUCOSE-CAPILLARY: 120 mg/dL — AB (ref 65–99)
Glucose-Capillary: 193 mg/dL — ABNORMAL HIGH (ref 65–99)
Glucose-Capillary: 176 mg/dL — ABNORMAL HIGH (ref 65–99)
Glucose-Capillary: 125 mg/dL — ABNORMAL HIGH (ref 65–99)

## 2017-03-30 MED ORDER — FUROSEMIDE 20 MG PO TABS
20.0000 mg | ORAL_TABLET | Freq: Every day | ORAL | Status: DC
  Administered 2017-03-30 – 2017-04-02 (×4): 20 mg via ORAL
  Filled 2017-03-30 (×4): qty 1

## 2017-03-30 MED ORDER — POTASSIUM CHLORIDE CRYS ER 10 MEQ PO TBCR
10.0000 meq | EXTENDED_RELEASE_TABLET | Freq: Every day | ORAL | Status: DC
  Administered 2017-03-30 – 2017-03-31 (×2): 10 meq via ORAL
  Filled 2017-03-30 (×2): qty 1

## 2017-03-30 NOTE — Progress Notes (Signed)
RN called to pts room - pt found lying on floor beside her recliner/ pt denies hitting her head / stated that she  slid to the edge of her recliner, the foot rest "gave out" and she fell on her right hip and hand /  CNA stated that she put pt into her recliner/ chair alarm under pt but chair alarm was not turned on/  no distress noted/ no injury noted /VSS/ baseline range of movement in all 4 limbs/ no complaint of pain other than chronic back soreness/ Dr. Leslye Peer made aware/ Pts daughter, Denajah Farias made aware/ nursing sup and dept director, Jenny Reichmann made aware/ pt placed back in bed with bed alarm on/ yellow socks and armband applied/  pt informed to let staff know if she develops any pain / will monitor frequently

## 2017-03-30 NOTE — Consult Note (Addendum)
   [facility] CM Inpatient Consult   03/30/2017  Samantha Aguirre 04-Jun-1945 [id number]    [facility] Care Management referral received.   Spoke with Samantha Aguirre to explain and offer [facility] Care Management services. She is agreeable and verbal consent obtained.   Denies having concerns with medications or with transportation. Reports she will be going to a new Primary Care MD at [facility]. She is unsure of the new MD's name. Confirms best contact number to call her post discharge is [phone].  Discussed [facility] Care Management follow up for COPD, DM management.  Reiterated [facility] Care Management will not interfere or replace services provided by home health.  Will refer to [facility] for transition of care.   Samantha Aguirre has history of diastolic heart failure, bronchial asthma, atrial fibrillation, COPD, hyperlipidemia, diabetes mellitus, seizure disorder, skin cancer. She has had x 3 hospitalizations in the past 6 months.  Made inpatient RNCM aware [place] Management will follow post discharge.   Marthenia Rolling, MSN-Ed, RN,BSN [facility] Liaison [phone]

## 2017-03-30 NOTE — Progress Notes (Signed)
Patient ID: Samantha Aguirre, female   DOB: [birth year], 72 y.o.   MRN: [id number]    [employer] PROGRESS NOTE  Salima Rumer BMW:[id number] DOB: [birth year] DOA: 03/27/2017 PCP: Olin Hauser, DO  HPI/Subjective: Patient had a slide out of her chair.  Little soreness in her right hip and right hand.  Still having trouble breathing and wheezing.  Objective: Vitals:   03/30/17 0831 03/30/17 1142  BP:  (!) 155/73  Pulse:  72  Resp:  18  Temp:    SpO2: 96% 94%    Filed Weights   03/27/17 1234 03/27/17 1648  Weight: 85.7 kg (189 lb) 87.1 kg (192 lb)    ROS: Review of Systems  Constitutional: Negative for chills and fever.  Eyes: Negative for blurred vision.  Respiratory: Positive for cough, shortness of breath and wheezing.   Cardiovascular: Negative for chest pain.  Gastrointestinal: Negative for abdominal pain, constipation, diarrhea, nausea and vomiting.  Genitourinary: Negative for dysuria.  Musculoskeletal: Negative for joint pain.  Neurological: Positive for weakness. Negative for dizziness and headaches.   Exam: Physical Exam  Constitutional: She is oriented to person, place, and time.  HENT:  Nose: No mucosal edema.  Mouth/Throat: No oropharyngeal exudate or posterior oropharyngeal edema.  Eyes: Conjunctivae, EOM and lids are normal. Pupils are equal, round, and reactive to light.  Neck: No JVD present. Carotid bruit is not present. No edema present. No thyroid mass and no thyromegaly present.  Cardiovascular: S1 normal and S2 normal. Exam reveals no gallop.  No murmur heard. Pulses:      Dorsalis pedis pulses are 2+ on the right side, and 2+ on the left side.  Respiratory: No respiratory distress. She has decreased breath sounds in the right middle field, the right lower field, the left middle field and the left lower field. She has no wheezes. She has rhonchi in the right middle field, the right lower field, the left middle field and the left  lower field. She has no rales.  GI: Soft. Bowel sounds are normal. There is no tenderness.  Musculoskeletal:       Right ankle: She exhibits no swelling.       Left ankle: She exhibits no swelling.  Lymphadenopathy:    She has no cervical adenopathy.  Neurological: She is alert and oriented to person, place, and time. No cranial nerve deficit.  Skin: Skin is warm. No rash noted. Nails show no clubbing.  Psychiatric: She has a normal mood and affect.      Data Reviewed: Basic Metabolic Panel: Recent Labs  Lab 03/28/17 0417  NA 139  K 3.2*  CL 107  CO2 24  GLUCOSE 195*  BUN 21*  CREATININE 0.80  CALCIUM 8.1*   CBC: Recent Labs  Lab 03/27/17 1244 03/28/17 0417  WBC 5.8 8.7  NEUTROABS 4.5  --   HGB 11.0* 10.2*  HCT 33.6* 31.1*  MCV 81.4 81.8  PLT 178 165   Cardiac Enzymes: Recent Labs  Lab 03/27/17 1244  TROPONINI <0.03   BNP (last 3 results) Recent Labs    10/03/16 1436 03/27/17 1244  BNP 36.0 133.0*    CBG: Recent Labs  Lab 03/29/17 1128 03/29/17 1659 03/29/17 2113 03/30/17 0741 03/30/17 1212  GLUCAP 180* 205* 112* 193* 120*    Recent Results (from the past 240 hour(s))  Culture, blood (routine x 2)     Status: None (Preliminary result)   Collection Time: 03/27/17 12:44 PM  Result Value Ref Range Status  Specimen Description BLOOD LAC  Final   Special Requests   Final    BOTTLES DRAWN AEROBIC AND ANAEROBIC Blood Culture adequate volume   Culture   Final    NO GROWTH 3 DAYS Performed at [facility] [address] [place], [place] [address]    Report Status PENDING  Incomplete  Culture, blood (routine x 2)     Status: None (Preliminary result)   Collection Time: 03/27/17 12:44 PM  Result Value Ref Range Status   Specimen Description BLOOD RAC  Final   Special Requests   Final    BOTTLES DRAWN AEROBIC AND ANAEROBIC Blood Culture adequate volume   Culture   Final    NO GROWTH 3 DAYS Performed at [facility], [address] [place], [place] [address]    Report Status PENDING  Incomplete      Scheduled Meds: . apixaban  5 mg Oral Q12H  . atorvastatin  80 mg Oral q1800  . baclofen  10 mg Oral TID  . budesonide (PULMICORT) nebulizer solution  0.5 mg Nebulization BID  . DULoxetine  60 mg Oral QHS  . ferrous sulfate  325 mg Oral BID WC  . fluticasone  1 spray Each Nare Daily  . furosemide  20 mg Oral Daily  . gabapentin  800 mg Oral BID  . glipiZIDE  10 mg Oral QAC breakfast   And  . metFORMIN  1,000 mg Oral Q breakfast  . glipiZIDE  5 mg Oral Q supper   And  . metFORMIN  500 mg Oral Q supper  . insulin aspart  0-5 Units Subcutaneous QHS  . insulin aspart  0-9 Units Subcutaneous TID WC  . ipratropium-albuterol  3 mL Nebulization Q6H  . levETIRAcetam  500 mg Oral BID  . levothyroxine  175 mcg Oral QAC breakfast  . methylPREDNISolone (SOLU-MEDROL) injection  40 mg Intravenous Q12H  . oseltamivir  75 mg Oral BID  . pantoprazole  40 mg Oral Daily  . potassium chloride  10 mEq Oral Daily  . sucralfate  1 g Oral BID    Assessment/Plan:  1. Acute hypoxic respiratory failure.  This has improved and patient is now on room air at rest. 2. COPD exacerbation.  Continue IV Solu-Medrol, budesonide and DuoNeb nebulizer solution.  Patient still wheezing quite a bit.  Will need to move better air prior to disposition. 3. Influenza A infection on Tamiflu.  4. 2.01-second pause.  Case discussed with cardiology.  Potentially Holter monitor as outpatient.  Not on any nodal blocking agents at this point. 5. Type 2 diabetes mellitus.  Sliding scale insulin plus oral medications.  Hemoglobin A1c actually 6.2 which is on the lower side.  May be able to get off the glipizide once off steroids. 6. History of paroxysmal atrial fibrillation and stroke on Eliquis 7. Hyperlipidemia unspecified on atorvastatin 8. History of seizure on Keppra 9. Hypothyroidism unspecified on levothyroxine 10. GERD on Carafate and  Protonix 11. Weakness physical therapy evaluation recommends home with home health and rolling walker 12. Hypokalemia.  Encouraged to eat.  Replace potassium orally  Code Status:     Code Status Orders  (From admission, onward)        Start     Ordered   03/27/17 1520  Full code  Continuous     03/27/17 1519    Code Status History    Date Active Date Inactive Code Status Order ID Comments User Context   02/18/2017 16:23 02/21/2017  19:19 Full Code [id number]  Gorden Harms, MD Inpatient   10/03/2016 18:41 10/04/2016 17:39 Full Code [id number]  Demetrios Loll, MD Inpatient   02/27/2016 17:44 02/28/2016 20:34 Full Code [id number]  Vaughan Basta, MD Inpatient   02/19/2016 15:53 02/21/2016 20:41 Full Code [id number]  Gladstone Lighter, MD Inpatient   12/27/2015 13:09 01/01/2016 18:33 DNR [id number]  Loletha Grayer, MD ED   09/16/2015 18:52 09/19/2015 19:29 Full Code [id number]  Baxter Hire, MD Inpatient   08/16/2015 17:14 08/19/2015 17:35 DNR [id number]  Willia Craze, NP Inpatient     Disposition Plan: Needs to be moving better air and off oxygen prior to disposition  Antibiotics:  Tamiflu  Time spent: 26 minutes  [place]

## 2017-03-30 NOTE — Consult Note (Signed)
Cardiology Consultation Note    Patient ID: Samantha Aguirre, MRN: [id number], DOB/AGE: 1945/07/25 72 y.o. Admit date: 03/27/2017   Date of Consult: 03/30/2017 Primary Physician: Olin Hauser, DO Primary Cardiologist: Dr. Clayborn Bigness  Chief Complaint: sob Reason for Consultation: 2.01 second pause Requesting MD: Dr. Leslye Peer  HPI: Samantha Aguirre is a 72 y.o. female with history of atrial fibrillation, COPD, diastolic heart failure, diabetes mellitus who presented to the emergency room with complaints of body aches chills low-grade fever with a positive flu test.  She was placed on nasal cannula oxygen and given Tamiflu.  She has done fairly well since admission.  She had no cardiac complaints other than shortness of breath.  She has a history of preserved LV function with echocardiogram done less than 6 months ago showing an EF of 60-65%.  During her admission she suffered a mechanical fall.  She was noted on telemetry to have a 2.1-second pause.  This was during sleeping hours and likely was sleep related.  She denies any syncope or presyncope.  She denies any chest pain rapid or irregular heartbeat.  Past Medical History:  Diagnosis Date  . Acid reflux   . Arthritis    Bilateral knees, hands, feet  . Asthma   . Atrial fibrillation ([place])   . Cancer ([place])   . Chest pain   . CHF (congestive heart failure) (HCC)    Diastolic CHF  . COPD (chronic obstructive pulmonary disease) ([place])   . Coronary artery disease   . Diabetes mellitus without complication ([place])   . Frequent headaches   . Heart attack ([place])   . High cholesterol   . HLD (hyperlipidemia)   . Hypertension   . MI (myocardial infarction) ([place])   . Seizures ([place])   . Skin cancer 2015   Suspected basal cell carcinoma on nose, surgically removed  . Stroke ([place])   . Thyroid disease   . Tremors of nervous system       Surgical History:  Past Surgical History:  Procedure Laterality Date  . ABDOMINAL HYSTERECTOMY   1976  . APPENDECTOMY  1980  . CAROTID ENDARTERECTOMY    . CHOLECYSTECTOMY  1980  . COLONOSCOPY N/A 02/20/2017   Procedure: COLONOSCOPY;  Surgeon: Lin Landsman, MD;  Location: [facility] ENDOSCOPY;  Service: Gastroenterology;  Laterality: N/A;  . ESOPHAGOGASTRODUODENOSCOPY N/A 02/20/2017   Procedure: ESOPHAGOGASTRODUODENOSCOPY (EGD);  Surgeon: Lin Landsman, MD;  Location: [facility] ENDOSCOPY;  Service: Gastroenterology;  Laterality: N/A;  . [place]  . KNEE SURGERY     left  . ROTATOR CUFF REPAIR     right  . TONSILLECTOMY  1950     Home Meds: Prior to Admission medications   Medication Sig Start Date End Date Taking? Authorizing Provider  acetaminophen (TYLENOL) 325 MG tablet Take 650 mg by mouth 2 (two) times daily.    Yes [provider]  albuterol (PROVENTIL HFA;VENTOLIN HFA) 108 (90 Base) MCG/ACT inhaler Inhale 2 puffs into the lungs every 6 (six) hours as needed for wheezing or shortness of breath. 10/08/16  Yes Karamalegos, Devonne Doughty, DO  apixaban (ELIQUIS) 5 MG TABS tablet Take 5 mg by mouth every 12 (twelve) hours.   Yes [provider]  atorvastatin (LIPITOR) 80 MG tablet Take 1 tablet (80 mg total) by mouth daily at 6 PM. 09/24/16  Yes Karamalegos, Devonne Doughty, DO  baclofen (LIORESAL) 10 MG tablet Take 10 mg by mouth 3 (three) times daily.   Yes [provider]  diclofenac sodium (VOLTAREN) 1 % GEL Apply 2 g topically 4 (four) times daily. 01/11/16  Yes Daymon Larsen, MD  DULoxetine (CYMBALTA) 60 MG capsule Take 1 capsule (60 mg total) by mouth at bedtime. 09/24/16  Yes Karamalegos, Devonne Doughty, DO  ferrous sulfate 325 (65 FE) MG EC tablet Take 1 tablet (325 mg total) by mouth 2 (two) times daily with a meal. 02/21/17  Yes Sudini, Srikar, MD  fluticasone (FLONASE) 50 MCG/ACT nasal spray Place 1 spray into both nostrils daily. 10/08/16  Yes Karamalegos, Devonne Doughty, DO  furosemide (LASIX) 20 MG tablet Take 10 mg by mouth daily.    Yes  Karamalegos, Devonne Doughty, DO  gabapentin (NEURONTIN) 800 MG tablet Take 1 tablet (800 mg total) by mouth 2 (two) times daily. 09/24/16  Yes Karamalegos, Devonne Doughty, DO  glipiZIDE-metformin (METAGLIP) 5-500 MG tablet Take by mouth 2 (two) times daily. 2 tablets every morning and 1 tablet at bedtime    Yes [provider]  levETIRAcetam (KEPPRA) 500 MG tablet Take 1 tablet (500 mg total) by mouth 2 (two) times daily. 09/19/15  Yes Epifanio Lesches, MD  levothyroxine (SYNTHROID, LEVOTHROID) 175 MCG tablet Take 1 tablet (175 mcg total) by mouth daily before breakfast. 09/24/16  Yes Karamalegos, Devonne Doughty, DO  lisinopril (PRINIVIL,ZESTRIL) 20 MG tablet Take 1 tablet (20 mg total) by mouth daily. 10/08/16  Yes Karamalegos, Devonne Doughty, DO  nitroGLYCERIN (NITROSTAT) 0.4 MG SL tablet Place 1 tablet (0.4 mg total) under the tongue every 5 (five) minutes as needed for chest pain. 02/28/16  Yes Mody, Ulice Bold, MD  omeprazole (PRILOSEC) 20 MG capsule Take 20 mg by mouth 2 (two) times daily.   Yes [provider]  potassium chloride (MICRO-K) 10 MEQ CR capsule Take 1 capsule (10 mEq total) by mouth daily. 09/24/16  Yes Karamalegos, Devonne Doughty, DO  sucralfate (CARAFATE) 1 g tablet TAKE (1) TABLET BY MOUTH FOUR TIMES A DAY 03/26/17  Yes Karamalegos, Devonne Doughty, DO  SUMAtriptan (IMITREX) 25 MG tablet Take 1 tablet (25 mg total) by mouth once as needed for up to 1 dose for migraine. May repeat one dose in 2 hours if persists, max in 24 hrs 02/13/17  Yes Karamalegos, Devonne Doughty, DO  umeclidinium bromide (INCRUSE ELLIPTA) 62.5 MCG/INH AEPB Inhale 1 puff into the lungs daily. 11/13/15  Yes Karamalegos, Alexander J, DO  fluticasone furoate-vilanterol (BREO ELLIPTA) 100-25 MCG/INH AEPB Inhale 1 puff into the lungs daily. Patient not taking: Reported on 03/27/2017 11/13/15   Olin Hauser, DO  omeprazole (PRILOSEC) 20 MG capsule Take 1 capsule (20 mg total) by mouth daily. Patient not taking:  Reported on 03/27/2017 10/16/16   Olin Hauser, DO    Inpatient Medications:  . apixaban  5 mg Oral Q12H  . atorvastatin  80 mg Oral q1800  . baclofen  10 mg Oral TID  . budesonide (PULMICORT) nebulizer solution  0.5 mg Nebulization BID  . DULoxetine  60 mg Oral QHS  . ferrous sulfate  325 mg Oral BID WC  . fluticasone  1 spray Each Nare Daily  . furosemide  20 mg Oral Daily  . gabapentin  800 mg Oral BID  . glipiZIDE  10 mg Oral QAC breakfast   And  . metFORMIN  1,000 mg Oral Q breakfast  . glipiZIDE  5 mg Oral Q supper   And  . metFORMIN  500 mg Oral Q supper  . insulin aspart  0-5 Units Subcutaneous QHS  .  insulin aspart  0-9 Units Subcutaneous TID WC  . ipratropium-albuterol  3 mL Nebulization Q6H  . levETIRAcetam  500 mg Oral BID  . levothyroxine  175 mcg Oral QAC breakfast  . methylPREDNISolone (SOLU-MEDROL) injection  40 mg Intravenous Q12H  . oseltamivir  75 mg Oral BID  . pantoprazole  40 mg Oral Daily  . potassium chloride  10 mEq Oral Daily  . sucralfate  1 g Oral BID     Allergies:  Allergies  Allergen Reactions  . Mushroom Extract Complex Anaphylaxis    Made patient "deathly sick"  . Atenolol     "MD took me off of it bc it was doing something wrong" Made patient HYPOTENSIVE  . Ivp Dye [Iodinated Diagnostic Agents] Itching    Pt injected with IV contrast.  5 min after injection pt complained of itching behind ear and on her abd.  . Morphine And Related Other (See Comments)    "stops my breathing" NEAR-RESPIRATORY ARREST  . Percocet [Oxycodone-Acetaminophen] Nausea And Vomiting and Other (See Comments)    Stomach pains  . Percodan [Oxycodone-Aspirin] Nausea And Vomiting and Other (See Comments)    Stomach pains  . Verapamil Other (See Comments)    " MD told me this was screwing me up" MAKES PATIENT HYPOTENSIVE    Social History   Socioeconomic History  . Marital status: Divorced    Spouse name: Not on file  . Number of children: 2  .  Years of education: 73  . Highest education level: Not on file  Social Needs  . Financial resource strain: Somewhat hard  . Food insecurity - worry: Sometimes true  . Food insecurity - inability: Sometimes true  . Transportation needs - medical: No  . Transportation needs - non-medical: No  Occupational History  . Occupation: Disabled  Tobacco Use  . Smoking status: Current Every Day Smoker    Packs/day: 0.50    Years: 50.00    Pack years: 25.00    Types: Cigarettes  . Smokeless tobacco: Current User  Substance and Sexual Activity  . Alcohol use: No  . Drug use: No  . Sexual activity: No  Other Topics Concern  . Not on file  Social History Narrative  . Not on file     Family History  Problem Relation Age of Onset  . Breast cancer Mother   . Heart disease Mother   . Stroke Mother   . Cancer Mother   . COPD Mother   . Heart disease Father   . Diabetes Father   . Stroke Father   . Alcohol abuse Sister   . Drug abuse Sister   . Stroke Sister   . Cancer Sister   . Mental illness Sister   . Heart disease Brother   . Arthritis Brother   . Diabetes Brother   . Heart disease Maternal Grandfather   . Heart disease Paternal Grandfather      Review of Systems: A 12-system review of systems was performed and is negative except as noted in the HPI.  Labs: No results for input(s): CKTOTAL, CKMB, TROPONINI in the last 72 hours. Lab Results  Component Value Date   WBC 8.7 03/28/2017   HGB 10.2 (L) 03/28/2017   HCT 31.1 (L) 03/28/2017   MCV 81.8 03/28/2017   PLT 165 03/28/2017    Recent Labs  Lab 03/28/17 0417  NA 139  K 3.2*  CL 107  CO2 24  BUN 21*  CREATININE 0.80  CALCIUM 8.1*  GLUCOSE 195*   Lab Results  Component Value Date   CHOL 159 10/04/2016   HDL 41 10/04/2016   LDLCALC 94 10/04/2016   TRIG 120 10/04/2016   No results found for: DDIMER  Radiology/Studies:  Dg Chest Port 1 View  Result Date: 03/27/2017 CLINICAL DATA:  Shortness of breath  and cough. EXAM: PORTABLE CHEST 1 VIEW COMPARISON:  02/18/2017 FINDINGS: The heart size and mediastinal contours are within normal limits. Aortic atherosclerosis. No effusions. Both lungs are clear. The visualized skeletal structures are unremarkable. IMPRESSION: No active disease. Aortic Atherosclerosis (ICD10-I70.0). Electronically Signed   By: Lorriane Shire M.D.   On: 03/27/2017 13:03    Wt Readings from Last 3 Encounters:  03/27/17 87.1 kg (192 lb)  03/10/17 85.7 kg (189 lb)  02/18/17 85.9 kg (189 lb 6.4 oz)    EKG: Normal sinus rhythm with occasional PACs.  Physical Exam:  Blood pressure (!) 155/73, pulse 72, temperature 98.8 F (37.1 C), temperature source Oral, resp. rate 18, height 5\' 1"  (1.549 m), weight 87.1 kg (192 lb), SpO2 94 %. Body mass index is 36.28 kg/m. General: Well developed, well nourished, in no acute distress. Head: Normocephalic, atraumatic, sclera non-icteric, no xanthomas, nares are without discharge.  Neck: Negative for carotid bruits. JVD not elevated. Lungs: Clear bilaterally to auscultation without wheezes, rales, or rhonchi. Breathing is unlabored. Heart: RRR with S1 S2. No murmurs, rubs, or gallops appreciated. Abdomen: Soft, non-tender, non-distended with normoactive bowel sounds. No hepatomegaly. No rebound/guarding. No obvious abdominal masses. Msk:  Strength and tone appear normal for age. Extremities: No clubbing or cyanosis. No edema.  Distal pedal pulses are 2+ and equal bilaterally. Neuro: Alert and oriented X 3. No facial asymmetry. No focal deficit. Moves all extremities spontaneously. Psych:  Responds to questions appropriately with a normal affect.     Assessment and Plan  72 year old female with history of atrial fibrillation, diabetes, COPD, diabetes who was admitted with flulike symptoms.  Was flu positive.  Currently is on Tamiflu.  Had an episode of approximately 2.1-second pause occurred during sleeping hours.  She has had no pauses  during waking hours.  Does not appear to have significant dysrhythmia or significant symptomatic bradycardia.  Would remain off of any AV nodal related drugs and continue to follow.  Should the patient have further prolonged pauses or episodes of apnea during sleeping hours, sleep study as an outpatient could be considered.  Signed, Teodoro Spray MD 03/30/2017, 3:18 PM Pager: [phone]

## 2017-03-30 NOTE — Progress Notes (Signed)
Notified Dr. Jerelyn Charles of 2.01 second pause on tele.

## 2017-03-31 LAB — GLUCOSE, CAPILLARY
GLUCOSE-CAPILLARY: 194 mg/dL — AB (ref 65–99)
GLUCOSE-CAPILLARY: 238 mg/dL — AB (ref 65–99)
GLUCOSE-CAPILLARY: 187 mg/dL — AB (ref 65–99)
Glucose-Capillary: 174 mg/dL — ABNORMAL HIGH (ref 65–99)

## 2017-03-31 LAB — CREATININE, SERUM
CREATININE: 0.72 mg/dL (ref 0.44–1.00)
GFR calc Af Amer: >60 mL/min (ref >60)

## 2017-03-31 LAB — CBC
HEMATOCRIT: 35.9 % (ref 35.0–47.0)
HEMOGLOBIN: 11.7 g/dL — AB (ref 12.0–16.0)
MCH: 26.8 pg (ref 26.0–34.0)
MCHC: 32.6 g/dL (ref 32.0–36.0)
MCV: 82.1 fL (ref 80.0–100.0)
Platelets: 209 10*3/uL (ref 150–440)
RBC: 4.37 MIL/uL (ref 3.80–5.20)
RDW: 25 % — ABNORMAL HIGH (ref 11.5–14.5)
WBC: 6 10*3/uL (ref 3.6–11.0)

## 2017-03-31 NOTE — Progress Notes (Signed)
Patient ID: Samantha Aguirre, female   DOB: 1945/02/18, 72 y.o.   MRN: [id number]   [employer] PROGRESS NOTE  Samantha Aguirre EHU:[id number] DOB: [birth year] DOA: 03/27/2017 PCP: Olin Hauser, DO  HPI/Subjective: Patient still with short of breath cough and wheeze.  Pulse ox dropped down to 86% when she was ambulating with physical therapy.  Pulse ox on room air acceptable.  Patient stated that she had another fall and her abdomen is sore  Objective: Vitals:   03/31/17 0811 03/31/17 1046  BP:    Pulse:    Resp:    Temp:    SpO2: 91% (!) 86%    Filed Weights   03/27/17 1234 03/27/17 1648  Weight: 85.7 kg (189 lb) 87.1 kg (192 lb)    ROS: Review of Systems  Constitutional: Negative for chills and fever.  Eyes: Negative for blurred vision.  Respiratory: Positive for cough, shortness of breath and wheezing.   Cardiovascular: Negative for chest pain.  Gastrointestinal: Negative for abdominal pain, constipation, diarrhea, nausea and vomiting.  Genitourinary: Negative for dysuria.  Musculoskeletal: Negative for joint pain.  Neurological: Positive for weakness. Negative for dizziness and headaches.   Exam: Physical Exam  Constitutional: She is oriented to person, place, and time.  HENT:  Nose: No mucosal edema.  Mouth/Throat: No oropharyngeal exudate or posterior oropharyngeal edema.  Eyes: Conjunctivae, EOM and lids are normal. Pupils are equal, round, and reactive to light.  Neck: No JVD present. Carotid bruit is not present. No edema present. No thyroid mass and no thyromegaly present.  Cardiovascular: S1 normal and S2 normal. Exam reveals no gallop.  No murmur heard. Pulses:      Dorsalis pedis pulses are 2+ on the right side, and 2+ on the left side.  Respiratory: No respiratory distress. She has decreased breath sounds in the right middle field, the right lower field, the left middle field and the left lower field. She has no wheezes. She has rhonchi in  the right middle field, the right lower field, the left middle field and the left lower field. She has no rales.  GI: Soft. Bowel sounds are normal. There is generalized tenderness.  Musculoskeletal:       Right ankle: She exhibits no swelling.       Left ankle: She exhibits no swelling.  Lymphadenopathy:    She has no cervical adenopathy.  Neurological: She is alert and oriented to person, place, and time. No cranial nerve deficit.  Skin: Skin is warm. No rash noted. Nails show no clubbing.  No signs of bruising on the abdomen  Psychiatric: She has a normal mood and affect.      Data Reviewed: Basic Metabolic Panel: Recent Labs  Lab 03/28/17 0417 03/31/17 0841  NA 139  --   K 3.2*  --   CL 107  --   CO2 24  --   GLUCOSE 195*  --   BUN 21*  --   CREATININE 0.80 0.72  CALCIUM 8.1*  --    CBC: Recent Labs  Lab 03/27/17 1244 03/28/17 0417 03/31/17 0841  WBC 5.8 8.7 6.0  NEUTROABS 4.5  --   --   HGB 11.0* 10.2* 11.7*  HCT 33.6* 31.1* 35.9  MCV 81.4 81.8 82.1  PLT 178 165 209   Cardiac Enzymes: Recent Labs  Lab 03/27/17 1244  TROPONINI <0.03   BNP (last 3 results) Recent Labs    10/03/16 1436 03/27/17 1244  BNP 36.0 133.0*    CBG: Recent  Labs  Lab 03/30/17 1212 03/30/17 1650 03/30/17 2113 03/31/17 0752 03/31/17 1210  GLUCAP 120* 176* 125* 194* 238*    Recent Results (from the past 240 hour(s))  Culture, blood (routine x 2)     Status: None (Preliminary result)   Collection Time: 03/27/17 12:44 PM  Result Value Ref Range Status   Specimen Description BLOOD LAC  Final   Special Requests   Final    BOTTLES DRAWN AEROBIC AND ANAEROBIC Blood Culture adequate volume   Culture   Final    NO GROWTH 4 DAYS Performed at [facility], [address] [place], [place] [address]    Report Status PENDING  Incomplete  Culture, blood (routine x 2)     Status: None (Preliminary result)   Collection Time: 03/27/17 12:44 PM  Result Value Ref Range  Status   Specimen Description BLOOD RAC  Final   Special Requests   Final    BOTTLES DRAWN AEROBIC AND ANAEROBIC Blood Culture adequate volume   Culture   Final    NO GROWTH 4 DAYS Performed at [facility], [address] [place], [place] [address]    Report Status PENDING  Incomplete      Scheduled Meds: . apixaban  5 mg Oral Q12H  . atorvastatin  80 mg Oral q1800  . baclofen  10 mg Oral TID  . budesonide (PULMICORT) nebulizer solution  0.5 mg Nebulization BID  . DULoxetine  60 mg Oral QHS  . ferrous sulfate  325 mg Oral BID WC  . fluticasone  1 spray Each Nare Daily  . furosemide  20 mg Oral Daily  . gabapentin  800 mg Oral BID  . glipiZIDE  10 mg Oral QAC breakfast   And  . metFORMIN  1,000 mg Oral Q breakfast  . glipiZIDE  5 mg Oral Q supper   And  . metFORMIN  500 mg Oral Q supper  . insulin aspart  0-5 Units Subcutaneous QHS  . insulin aspart  0-9 Units Subcutaneous TID WC  . ipratropium-albuterol  3 mL Nebulization Q6H  . levETIRAcetam  500 mg Oral BID  . levothyroxine  175 mcg Oral QAC breakfast  . methylPREDNISolone (SOLU-MEDROL) injection  40 mg Intravenous Q12H  . oseltamivir  75 mg Oral BID  . pantoprazole  40 mg Oral Daily  . potassium chloride  10 mEq Oral Daily  . sucralfate  1 g Oral BID    Assessment/Plan:  1. Acute hypoxic respiratory failure.  This has improved and patient is now on room air at rest. 2. COPD exacerbation.  Continue IV Solu-Medrol 40 mg IV every 12 hours, budesonide and DuoNeb nebulizer solution.  Patient still wheezing quite a bit.  Will need to move better air prior to disposition. 3. Influenza A infection on Tamiflu.  4. 2.01-second pause.  Case discussed with cardiology.  Potentially Holter monitor as outpatient.  Not on any nodal blocking agents at this point. 5. Type 2 diabetes mellitus.  Sliding scale insulin plus oral medications.  Hemoglobin A1c actually 6.2 which is on the lower side.  May be able to get off the  glipizide once off steroids. 6. History of paroxysmal atrial fibrillation and stroke on Eliquis 7. Hyperlipidemia unspecified on atorvastatin 8. History of seizure on Keppra 9. Hypothyroidism unspecified on levothyroxine 10. GERD on Carafate and Protonix 11. Weakness physical therapy evaluation recommends home with home health and rolling walker 12. Hypokalemia.  Replace orally during the hospital course  Code Status:  Code Status Orders  (From admission, onward)        Start     Ordered   03/27/17 1520  Full code  Continuous     03/27/17 1519    Code Status History    Date Active Date Inactive Code Status Order ID Comments User Context   02/18/2017 16:23 02/21/2017 19:19 Full Code [id number]  Gorden Harms, MD Inpatient   10/03/2016 18:41 10/04/2016 17:39 Full Code [id number]  Demetrios Loll, MD Inpatient   02/27/2016 17:44 02/28/2016 20:34 Full Code [id number]  Vaughan Basta, MD Inpatient   02/19/2016 15:53 02/21/2016 20:41 Full Code [id number]  Gladstone Lighter, MD Inpatient   12/27/2015 13:09 01/01/2016 18:33 DNR [id number]  Loletha Grayer, MD ED   09/16/2015 18:52 09/19/2015 19:29 Full Code [id number]  Baxter Hire, MD Inpatient   08/16/2015 17:14 08/19/2015 17:35 DNR [id number]  Willia Craze, NP Inpatient     Disposition Plan:  taking a long time to progress  Antibiotics:  Tamiflu  Time spent: 25 minutes  [place]

## 2017-03-31 NOTE — Plan of Care (Signed)
Patient is s/p fall day 2 with no injuries noted. Bed alarm on and functioning without difficulties. Will continue to monitor and endorse.

## 2017-03-31 NOTE — Progress Notes (Signed)
Physical Therapy Treatment Patient Details Name: Samantha Aguirre MRN: [id number] DOB: January 19, 1945 Today's Date: 03/31/2017    History of Present Illness Samantha Aguirre is a 72 y.o. female with a known history of diastolic heart failure, bronchial asthma, atrial fibrillation, COPD, hyperlipidemia, diabetes mellitus, seizure disorder, skin cancer admitted to the emergency room with increased shortness of breath, cough and congestion for the last few days.  Patient also has some chills, body aches and a low-grade fever flu test was positive in the emergency room.  Patient was put on oxygen by nasal cannula for low oxygen saturationand stabilized and given Tamiflu.  No complaints of any chest pain.  Hospitalist service was consulted for further care. Pt is now admitted for acute hypoxic respiratory failure, COPD exaccerbation, and influenza A.     PT Comments    Pt in bed.  Stated she has a coughing spell and was incontinent.  Pt 91% on room air.  Transitioned out of bed with ease and was able to ambulate to and from bathroom with walker and min guard.  Assist to don attends pad.  Upon sitting in chair O2 sats 86% on room air.  Deep breathing increased sats back to 91%.  Pt generally fatigued with activity and declined further mobility at this time.  Remained up in recliner with MD in room.   Follow Up Recommendations  Home health PT     Equipment Recommendations  Rolling walker with 5" wheels    Recommendations for Other Services       Precautions / Restrictions Precautions Precautions: Fall Restrictions Weight Bearing Restrictions: No    Mobility  Bed Mobility Overal bed mobility: Modified Independent                Transfers Overall transfer level: Needs assistance Equipment used: Rolling walker (2 wheeled) Transfers: Sit to/from Stand Sit to Stand: Min guard         General transfer comment: Pt demonstrates safe hand placement. Requires increased tiem to come to  standing. Steady with UE support on walker  Ambulation/Gait Ambulation/Gait assistance: Min guard Ambulation Distance (Feet): 40 Feet Assistive device: Rolling walker (2 wheeled) Gait Pattern/deviations: Step-through pattern   Gait velocity interpretation: Below normal speed for age/gender General Gait Details: to and from bathroom on rool air.   Stairs            Wheelchair Mobility    Modified Rankin (Stroke Patients Only)       Balance Overall balance assessment: Needs assistance Sitting-balance support: No upper extremity supported Sitting balance-Leahy Scale: Good     Standing balance support: No upper extremity supported Standing balance-Leahy Scale: Fair                              Cognition Arousal/Alertness: Awake/alert Behavior During Therapy: WFL for tasks assessed/performed Overall Cognitive Status: Within Functional Limits for tasks assessed                                        Exercises      General Comments        Pertinent Vitals/Pain Pain Assessment: 0-10 Pain Score: 4  Pain Location: right side stomach Pain Descriptors / Indicators: Sore Pain Intervention(s): Limited activity within patient's tolerance;Monitored during session    Home Living  Prior Function            PT Goals (current goals can now be found in the care plan section) Progress towards PT goals: Progressing toward goals    Frequency    Min 2X/week      PT Plan Current plan remains appropriate    Co-evaluation              AM-PAC PT "6 Clicks" Daily Activity  Outcome Measure  Difficulty turning over in bed (including adjusting bedclothes, sheets and blankets)?: None Difficulty moving from lying on back to sitting on the side of the bed? : None Difficulty sitting down on and standing up from a chair with arms (e.g., wheelchair, bedside commode, etc,.)?: A Little Help needed moving to and  from a bed to chair (including a wheelchair)?: A Little Help needed walking in hospital room?: A Little   6 Click Score: 17    End of Session Equipment Utilized During Treatment: Gait belt Activity Tolerance: Patient tolerated treatment well;Patient limited by fatigue Patient left: in chair;with chair alarm set;with call bell/phone within reach;Other (comment)         Time: 5621-3086 PT Time Calculation (min) (ACUTE ONLY): 16 min  Charges:  $Gait Training: 8-22 mins                    G Codes:      Chesley Noon, PTA 03/31/17, 10:51 AM

## 2017-04-01 LAB — GLUCOSE, CAPILLARY
GLUCOSE-CAPILLARY: 168 mg/dL — AB (ref 65–99)
GLUCOSE-CAPILLARY: 234 mg/dL — AB (ref 65–99)
Glucose-Capillary: 154 mg/dL — ABNORMAL HIGH (ref 65–99)
Glucose-Capillary: 78 mg/dL (ref 65–99)

## 2017-04-01 LAB — CULTURE, BLOOD (ROUTINE X 2)
Culture: NO GROWTH
Culture: NO GROWTH
SPECIAL REQUESTS: ADEQUATE
SPECIAL REQUESTS: ADEQUATE

## 2017-04-01 LAB — MAGNESIUM: MAGNESIUM: 1.8 mg/dL (ref 1.7–2.4)

## 2017-04-01 LAB — BASIC METABOLIC PANEL
ANION GAP: 11 (ref 5–15)
BUN: 17 mg/dL (ref 6–20)
CO2: 24 mmol/L (ref 22–32)
Calcium: 8.6 mg/dL — ABNORMAL LOW (ref 8.9–10.3)
Chloride: 103 mmol/L (ref 101–111)
Creatinine, Ser: 0.65 mg/dL (ref 0.44–1.00)
GLUCOSE: 228 mg/dL — AB (ref 65–99)
POTASSIUM: 3.4 mmol/L — AB (ref 3.5–5.1)
Sodium: 138 mmol/L (ref 135–145)

## 2017-04-01 MED ORDER — POTASSIUM CHLORIDE CRYS ER 20 MEQ PO TBCR
40.0000 meq | EXTENDED_RELEASE_TABLET | Freq: Every day | ORAL | Status: DC
  Administered 2017-04-01 – 2017-04-02 (×2): 40 meq via ORAL
  Filled 2017-04-01 (×2): qty 2

## 2017-04-01 MED ORDER — MAGNESIUM SULFATE 2 GM/50ML IV SOLN
2.0000 g | Freq: Once | INTRAVENOUS | Status: AC
  Administered 2017-04-01: 2 g via INTRAVENOUS
  Filled 2017-04-01: qty 50

## 2017-04-01 NOTE — Care Management Note (Signed)
Case Management Note  Patient Details  Name: Zyasia Halbleib MRN: [id number] Date of Birth: [birth year]  Subjective/Objective:                  RNCM spoke with patient regarding transition of care. She is from home alone. She is not certain if she has a walker or not at home.  She would like to use [facility] again for PT. Her current PCP is Dr. Parks Ranger however she is in the process of changing PCPs.  O2 is acute.   Action/Plan:  RNCM told patient I would follow up with her tomorrow so she could check on rolling walker. I have notified Joelene Millin with [facility] regarding need for services.   Expected Discharge Date:  03/29/17               Expected Discharge Plan:     In-House Referral:     Discharge planning Services  CM Consult  Post Acute Care Choice:  Home Health, Durable Medical Equipment Choice offered to:  Patient  DME Arranged:    DME Agency:     HH Arranged:  PT HH Agency:  [facility]  Status of Service:  In process, will continue to follow  If discussed at Long Length of Stay Meetings, dates discussed:    Additional Comments:  Marshell Garfinkel, RN 04/01/2017, 4:11 PM

## 2017-04-01 NOTE — Progress Notes (Signed)
MD weiting was notified that pt had a burst of SVT 180 but did not sustain.  No further orders at this time.

## 2017-04-01 NOTE — Progress Notes (Signed)
Patient ID: Samantha Aguirre, female   DOB: [birth year], 72 y.o.   MRN: [id number]   [facility] PROGRESS NOTE  Lekesha Claw ATF:[id number] DOB: Sep 11, 1945 DOA: 03/27/2017 PCP: Olin Hauser, DO  HPI/Subjective: Patient finally starting to feel better.  Still with some cough and shortness of breath and wheeze.  Feels like she is breathing better.  Objective: Vitals:   04/01/17 0400 04/01/17 0744  BP: (!) 158/52 (!) 147/96  Pulse: 70 (!) 52  Resp: 19 18  Temp: 98.2 F (36.8 C)   SpO2: 92% 92%    Filed Weights   03/27/17 1234 03/27/17 1648  Weight: 85.7 kg (189 lb) 87.1 kg (192 lb)    ROS: Review of Systems  Constitutional: Negative for chills and fever.  Eyes: Negative for blurred vision.  Respiratory: Positive for cough, shortness of breath and wheezing.   Cardiovascular: Negative for chest pain.  Gastrointestinal: Negative for abdominal pain, constipation, diarrhea, nausea and vomiting.  Genitourinary: Negative for dysuria.  Musculoskeletal: Negative for joint pain.  Neurological: Positive for weakness. Negative for dizziness and headaches.   Exam: Physical Exam  Constitutional: She is oriented to person, place, and time.  HENT:  Nose: No mucosal edema.  Mouth/Throat: No oropharyngeal exudate or posterior oropharyngeal edema.  Eyes: Conjunctivae, EOM and lids are normal. Pupils are equal, round, and reactive to light.  Neck: No JVD present. Carotid bruit is not present. No edema present. No thyroid mass and no thyromegaly present.  Cardiovascular: S1 normal and S2 normal. Exam reveals no gallop.  No murmur heard. Pulses:      Dorsalis pedis pulses are 2+ on the right side, and 2+ on the left side.  Respiratory: No respiratory distress. She has decreased breath sounds in the right lower field and the left lower field. She has wheezes in the right middle field, the right lower field and the left lower field. She has no rhonchi. She has no rales.  GI:  Soft. Bowel sounds are normal. There is generalized tenderness.  Musculoskeletal:       Right ankle: She exhibits no swelling.       Left ankle: She exhibits no swelling.  Lymphadenopathy:    She has no cervical adenopathy.  Neurological: She is alert and oriented to person, place, and time. No cranial nerve deficit.  Skin: Skin is warm. No rash noted. Nails show no clubbing.  No signs of bruising on the abdomen  Psychiatric: She has a normal mood and affect.      Data Reviewed: Basic Metabolic Panel: Recent Labs  Lab 03/28/17 0417 03/31/17 0841 04/01/17 0553  NA 139  --  138  K 3.2*  --  3.4*  CL 107  --  103  CO2 24  --  24  GLUCOSE 195*  --  228*  BUN 21*  --  17  CREATININE 0.80 0.72 0.65  CALCIUM 8.1*  --  8.6*  MG  --   --  1.8   CBC: Recent Labs  Lab 03/27/17 1244 03/28/17 0417 03/31/17 0841  WBC 5.8 8.7 6.0  NEUTROABS 4.5  --   --   HGB 11.0* 10.2* 11.7*  HCT 33.6* 31.1* 35.9  MCV 81.4 81.8 82.1  PLT 178 165 209   Cardiac Enzymes: Recent Labs  Lab 03/27/17 1244  TROPONINI <0.03   BNP (last 3 results) Recent Labs    10/03/16 1436 03/27/17 1244  BNP 36.0 133.0*    CBG: Recent Labs  Lab 03/31/17 1210 03/31/17 1624  03/31/17 2203 04/01/17 0745 04/01/17 1152  GLUCAP 238* 187* 174* 168* 234*    Recent Results (from the past 240 hour(s))  Culture, blood (routine x 2)     Status: None   Collection Time: 03/27/17 12:44 PM  Result Value Ref Range Status   Specimen Description BLOOD LAC  Final   Special Requests   Final    BOTTLES DRAWN AEROBIC AND ANAEROBIC Blood Culture adequate volume   Culture   Final    NO GROWTH 5 DAYS Performed at [facility], [address], [place], [place] [address]    Report Status 04/01/2017 FINAL  Final  Culture, blood (routine x 2)     Status: None   Collection Time: 03/27/17 12:44 PM  Result Value Ref Range Status   Specimen Description BLOOD RAC  Final   Special Requests   Final    BOTTLES  DRAWN AEROBIC AND ANAEROBIC Blood Culture adequate volume   Culture   Final    NO GROWTH 5 DAYS Performed at [facility], [address], [place], [place] [address]    Report Status 04/01/2017 FINAL  Final      Scheduled Meds: . apixaban  5 mg Oral Q12H  . atorvastatin  80 mg Oral q1800  . baclofen  10 mg Oral TID  . budesonide (PULMICORT) nebulizer solution  0.5 mg Nebulization BID  . DULoxetine  60 mg Oral QHS  . ferrous sulfate  325 mg Oral BID WC  . fluticasone  1 spray Each Nare Daily  . furosemide  20 mg Oral Daily  . gabapentin  800 mg Oral BID  . glipiZIDE  10 mg Oral QAC breakfast   And  . metFORMIN  1,000 mg Oral Q breakfast  . glipiZIDE  5 mg Oral Q supper   And  . metFORMIN  500 mg Oral Q supper  . insulin aspart  0-5 Units Subcutaneous QHS  . insulin aspart  0-9 Units Subcutaneous TID WC  . ipratropium-albuterol  3 mL Nebulization Q6H  . levETIRAcetam  500 mg Oral BID  . levothyroxine  175 mcg Oral QAC breakfast  . methylPREDNISolone (SOLU-MEDROL) injection  40 mg Intravenous Q12H  . pantoprazole  40 mg Oral Daily  . potassium chloride  40 mEq Oral Daily  . sucralfate  1 g Oral BID    Assessment/Plan:  1. COPD exacerbation.  Continue IV Solu-Medrol 40 mg IV every 12 hours, budesonide and DuoNeb nebulizer solution, added Brovana.  Patient finally starting to move a little bit better air.  Hopefully will be able to discharge home tomorrow. 2. Acute hypoxic respiratory failure.  Patient tapered off oxygen. 3. Influenza A infection on Tamiflu.  4. 2.01-second pause.  Episode of SVT.  Potentially Holter monitor as outpatient.  Not on any nodal blocking agents at this point. 5. Type 2 diabetes mellitus.  Sliding scale insulin plus oral medications.  Hemoglobin A1c actually 6.2 which is on the lower side.  May be able to get off the glipizide once off steroids. 6. History of paroxysmal atrial fibrillation and stroke on Eliquis 7. Hyperlipidemia unspecified  on atorvastatin 8. History of seizure on Keppra 9. Hypothyroidism unspecified on levothyroxine 10. GERD on Carafate and Protonix 11. Weakness physical therapy evaluation recommends home with home health and rolling walker 12. Hypokalemia.  Replace orally.  Code Status:     Code Status Orders  (From admission, onward)        Start     Ordered  03/27/17 1520  Full code  Continuous     03/27/17 1519    Code Status History    Date Active Date Inactive Code Status Order ID Comments User Context   02/18/2017 16:23 02/21/2017 19:19 Full Code [id number]  Gorden Harms, MD Inpatient   10/03/2016 18:41 10/04/2016 17:39 Full Code [id number]  Demetrios Loll, MD Inpatient   02/27/2016 17:44 02/28/2016 20:34 Full Code [id number]  Vaughan Basta, MD Inpatient   02/19/2016 15:53 02/21/2016 20:41 Full Code [id number]  Gladstone Lighter, MD Inpatient   12/27/2015 13:09 01/01/2016 18:33 DNR [id number]  Loletha Grayer, MD ED   09/16/2015 18:52 09/19/2015 19:29 Full Code [id number]  Baxter Hire, MD Inpatient   08/16/2015 17:14 08/19/2015 17:35 DNR [id number]  Willia Craze, NP Inpatient     Disposition Plan: Hopefully patient will be ready to go home tomorrow  Antibiotics:  Tamiflu  Time spent: 24 minutes  [place]

## 2017-04-01 NOTE — Progress Notes (Signed)
Inpatient Diabetes Program Recommendations  AACE/ADA: New Consensus Statement on Inpatient Glycemic Control (2015)  Target Ranges:  Prepandial:   less than 140 mg/dL      Peak postprandial:   less than 180 mg/dL (1-2 hours)      Critically ill patients:  140 - 180 mg/dL   Lab Results  Component Value Date   GLUCAP 168 (H) 04/01/2017   HGBA1C 6.2 (H) 03/28/2017    Review of Glycemic ControlResults for TALENA, NEIRA (MRN [id number]) as of 04/01/2017 11:37  Ref. Range 03/31/2017 07:52 03/31/2017 12:10 03/31/2017 16:24 03/31/2017 22:03 04/01/2017 07:45  Glucose-Capillary Latest Ref Range: 65 - 99 mg/dL 194 (H) 238 (H) 187 (H) 174 (H) 168 (H)   Diabetes history: Type 2 DM Outpatient Diabetes medications:  Metaglip 5-500 mg bid,  Current orders for Inpatient glycemic control:  Novolog sensitive tid with meals and HS, Glucotrol 10 mg with breakfast and 5 mg with supper, Metformin 1000 mg with breakfast and 500 mg with supper Solumedrol 40 mg q 12 hours Inpatient Diabetes Program Recommendations:   Please consider increasing Novolog correction to moderate tid with meals and HS.   Thanks,  Adah Perl, RN, BC-ADM Inpatient Diabetes Coordinator Pager [phone] (8a-5p)

## 2017-04-01 NOTE — Progress Notes (Signed)
Physical Therapy Treatment Patient Details Name: Samantha Aguirre MRN: [id number] DOB: March 26, 1945 Today's Date: 04/01/2017    History of Present Illness Samantha Aguirre is a 72 y.o. female with a known history of diastolic heart failure, bronchial asthma, atrial fibrillation, COPD, hyperlipidemia, diabetes mellitus, seizure disorder, skin cancer admitted to the emergency room with increased shortness of breath, cough and congestion for the last few days.  Patient also has some chills, body aches and a low-grade fever flu test was positive in the emergency room.  Patient was put on oxygen by nasal cannula for low oxygen saturationand stabilized and given Tamiflu.  No complaints of any chest pain.  Hospitalist service was consulted for further care. Pt is now admitted for acute hypoxic respiratory failure, COPD exaccerbation, and influenza A.     PT Comments    Pt making progress with therapy. She demonstrates improved respiratory status with ambulation. She is able to complete 2 laps around RN station. Two standing rest breaks provided during ambulation. SaO2 91% on room air. Drops at one point to 88% briefly but mostly stays around 90%. Pt reports intermittent mild DOE but resolves with standing rest breaks. Good stability noted with rolling walker. Pt continues to demonstrate need for [facility] PT at discharge. Pt will benefit from PT services to address deficits in strength, balance, and mobility in order to return to full function at home.   Follow Up Recommendations  Home health PT     Equipment Recommendations  Rolling walker with 5" wheels    Recommendations for Other Services       Precautions / Restrictions Precautions Precautions: Fall Restrictions Weight Bearing Restrictions: No    Mobility  Bed Mobility Overal bed mobility: Modified Independent             General bed mobility comments: Increased time required. HOB flat but use of bed rails  Transfers Overall transfer  level: Needs assistance Equipment used: Rolling walker (2 wheeled) Transfers: Sit to/from Stand Sit to Stand: Min guard         General transfer comment: Pt demonstrates Aguirre hand placement. Speed is improving however continues to require increase in effort.  Ambulation/Gait Ambulation/Gait assistance: Min guard Ambulation Distance (Feet): 350 Feet Assistive device: Rolling walker (2 wheeled) Gait Pattern/deviations: Step-through pattern   Gait velocity interpretation: Below normal speed for age/gender General Gait Details: Pt able to ambulate 2 laps around RN station. 2 standing rest breaks provided during ambulation. SaO2 91% on room air. Drops at one point to 88% briefly but mostly stays around 90%. Pt reports intermittent mild DOE but resolves with standing rest. Good stability noted with rolling walker   Stairs            Wheelchair Mobility    Modified Rankin (Stroke Patients Only)       Balance Overall balance assessment: Needs assistance Sitting-balance support: No upper extremity supported Sitting balance-Leahy Scale: Good     Standing balance support: No upper extremity supported Standing balance-Leahy Scale: Fair Standing balance comment: Fair balance with feet together. Positive Romberg. Single leg stance is approximately 1s on each LE                            Cognition Arousal/Alertness: Awake/alert Behavior During Therapy: WFL for tasks assessed/performed Overall Cognitive Status: Within Functional Limits for tasks assessed  Exercises      General Comments        Pertinent Vitals/Pain Pain Assessment: No/denies pain    Home Living                      Prior Function            PT Goals (current goals can now be found in the care plan section) Acute Rehab PT Goals Patient Stated Goal: Return to prior level of function at home PT Goal Formulation: With  patient Time For Goal Achievement: 04/11/17 Potential to Achieve Goals: Good Progress towards PT goals: Progressing toward goals    Frequency    Min 2X/week      PT Plan Current plan remains appropriate    Co-evaluation              AM-PAC PT "6 Clicks" Daily Activity  Outcome Measure  Difficulty turning over in bed (including adjusting bedclothes, sheets and blankets)?: None Difficulty moving from lying on back to sitting on the side of the bed? : None Difficulty sitting down on and standing up from a chair with arms (e.g., wheelchair, bedside commode, etc,.)?: None Help needed moving to and from a bed to chair (including a wheelchair)?: None Help needed walking in hospital room?: A Little Help needed climbing 3-5 steps with a railing? : A Little 6 Click Score: 22    End of Session Equipment Utilized During Treatment: Gait belt Activity Tolerance: Patient tolerated treatment well;Patient limited by fatigue Patient left: in chair;with chair alarm set;with call bell/phone within reach;Other (comment)   PT Visit Diagnosis: Unsteadiness on feet (R26.81);Muscle weakness (generalized) (M62.81);History of falling (Z91.81)     Time: 2951-8841 PT Time Calculation (min) (ACUTE ONLY): 13 min  Charges:  $Gait Training: 8-22 mins                    G Codes:       Samantha Aguirre Samantha Aguirre PT, DPT     Samantha Aguirre 04/01/2017, 5:22 PM

## 2017-04-02 ENCOUNTER — Other Ambulatory Visit: Payer: Self-pay | Admitting: Family Medicine

## 2017-04-02 DIAGNOSIS — E1140 Type 2 diabetes mellitus with diabetic neuropathy, unspecified: Secondary | ICD-10-CM

## 2017-04-02 LAB — GLUCOSE, CAPILLARY: GLUCOSE-CAPILLARY: 198 mg/dL — AB (ref 65–99)

## 2017-04-02 MED ORDER — GLIPIZIDE-METFORMIN HCL 5-500 MG PO TABS: 1.0000 | ORAL_TABLET | Freq: Two times a day (BID) | ORAL | Status: DC

## 2017-04-02 MED ORDER — GLIPIZIDE-METFORMIN HCL 5-500 MG PO TABS: 1.0000 | ORAL_TABLET | Freq: Two times a day (BID) | ORAL | 1 refills | Status: DC

## 2017-04-02 MED ORDER — PREDNISONE 10 MG PO TABS: ORAL_TABLET | ORAL | 0 refills | Status: DC

## 2017-04-02 MED ORDER — FLUTICASONE PROPIONATE HFA 220 MCG/ACT IN AERO: 1.0000 | INHALATION_SPRAY | Freq: Two times a day (BID) | RESPIRATORY_TRACT | 0 refills | Status: DC

## 2017-04-02 NOTE — Discharge Summary (Signed)
[facility] NAME: Samantha Aguirre    MR#:  [id number]  DATE OF BIRTH:  [birth year]  DATE OF ADMISSION:  03/27/2017 ADMITTING PHYSICIAN: Saundra Shelling, MD  DATE OF DISCHARGE: 04/02/2017 12:17 PM  PRIMARY CARE PHYSICIAN: Olin Hauser, DO    ADMISSION DIAGNOSIS:  Cough [R05] SOB (shortness of breath) [R06.02] Hypoxia [R09.02] Influenza [J11.1]  DISCHARGE DIAGNOSIS:  Active Problems:   Flu   SECONDARY DIAGNOSIS:   Past Medical History:  Diagnosis Date  . Acid reflux   . Arthritis    Bilateral knees, hands, feet  . Asthma   . Atrial fibrillation ([place])   . Cancer ([place])   . Chest pain   . CHF (congestive heart failure) (H[facility])    Diastolic CHF  . COPD (chronic obstructive pulmonary disease) ([place])   . Coronary artery disease   . Diabetes mellitus without complication ([place])   . Frequent headaches   . Heart attack ([place])   . High cholesterol   . HLD (hyperlipidemia)   . Hypertension   . MI (myocardial infarction) ([place])   . Seizures ([place])   . Skin cancer 2015   Suspected basal cell carcinoma on nose, surgically removed  . Stroke ([place])   . Thyroid disease   . Tremors of nervous system     HOSPITAL COURSE:   1.  COPD exacerbation.  The patient was on Solu-Medrol 40 mg IV twice daily, budesonide and DuoNeb nebulizer solution and Brovana during the hospital course.  Patient moving better air and having less symptoms.  Patient will be switched over to prednisone for quick taper.  I added Flovent inhaler and she will continue her other inhalers. 2.  Acute hypoxic respiratory failure.  This has improved the patient was tapered off oxygen. 3.  Influenza A.  Finished Tamiflu during the hospital course. 4.  2.01-second pause an episode of SVT.  Patient not on any nodal blocking agents at this point as per cardiology.  Can consider Holter monitoring as outpatient once recovered from the flu. 5.  Type 2 diabetes mellitus.   Hemoglobin A1c actually on the lower side at 6.2.  Decrease her glipizide metformin combination to 1 pill twice a day 6.  Paroxysmal atrial fibrillation and stroke history on Eliquis 7.  History of seizure on Keppra 8.  Hypothyroidism unspecified on levothyroxine 9.  GERD on Carafate and Protonix 10.  Weakness.  Physical therapy recommended home with home health 11.  Hypokalemia replaced orally 12.  Hyperlipidemia unspecified on atorvastatin 13.  The care manager call me back to reevaluate the patient secondary to speech abnormalities.  The patient states that her speech is okay.  At times even at home she has trouble getting her words out.  Patient strength 5 out of 5 bilateral upper and lower extremities.  Patient already on Eliquis for anticoagulation.  Patient states she is ready to go home and I discharge her home  DISCHARGE CONDITIONS:   Satisfactory  CONSULTS OBTAINED:  Treatment Team:  Teodoro Spray, MD  DRUG ALLERGIES:   Allergies  Allergen Reactions  . Mushroom Extract Complex Anaphylaxis    Made patient "deathly sick"  . Atenolol     "MD took me off of it bc it was doing something wrong" Made patient HYPOTENSIVE  . Ivp Dye [Iodinated Diagnostic Agents] Itching    Pt injected with IV contrast.  5 min after injection pt complained of itching behind ear and on her abd.  Marland Kitchen  Morphine And Related Other (See Comments)    "stops my breathing" NEAR-RESPIRATORY ARREST  . Percocet [Oxycodone-Acetaminophen] Nausea And Vomiting and Other (See Comments)    Stomach pains  . Percodan [Oxycodone-Aspirin] Nausea And Vomiting and Other (See Comments)    Stomach pains  . Verapamil Other (See Comments)    " MD told me this was screwing me up" MAKES PATIENT HYPOTENSIVE    DISCHARGE MEDICATIONS:   Allergies as of 04/02/2017      Reactions   Mushroom Extract Complex Anaphylaxis   Made patient "deathly sick"   Atenolol    "MD took me off of it bc it was doing something wrong" Made  patient HYPOTENSIVE   Ivp Dye [iodinated Diagnostic Agents] Itching   Pt injected with IV contrast.  5 min after injection pt complained of itching behind ear and on her abd.   Morphine And Related Other (See Comments)   "stops my breathing" NEAR-RESPIRATORY ARREST   Percocet [oxycodone-acetaminophen] Nausea And Vomiting, Other (See Comments)   Stomach pains   Percodan [oxycodone-aspirin] Nausea And Vomiting, Other (See Comments)   Stomach pains   Verapamil Other (See Comments)   " MD told me this was screwing me up" MAKES PATIENT HYPOTENSIVE      Medication List    STOP taking these medications   baclofen 10 MG tablet Commonly known as:  LIORESAL   fluticasone furoate-vilanterol 100-25 MCG/INH Aepb Commonly known as:  BREO ELLIPTA   glipiZIDE-metformin 5-500 MG tablet Commonly known as:  METAGLIP     TAKE these medications   albuterol 108 (90 Base) MCG/ACT inhaler Commonly known as:  PROVENTIL HFA;VENTOLIN HFA Inhale 2 puffs into the lungs every 6 (six) hours as needed for wheezing or shortness of breath.   apixaban 5 MG Tabs tablet Commonly known as:  ELIQUIS Take 5 mg by mouth every 12 (twelve) hours.   atorvastatin 80 MG tablet Commonly known as:  LIPITOR Take 1 tablet (80 mg total) by mouth daily at 6 PM.   diclofenac sodium 1 % Gel Commonly known as:  VOLTAREN Apply 2 g topically 4 (four) times daily.   DULoxetine 60 MG capsule Commonly known as:  CYMBALTA Take 1 capsule (60 mg total) by mouth at bedtime.   ferrous sulfate 325 (65 FE) MG EC tablet Take 1 tablet (325 mg total) by mouth 2 (two) times daily with a meal.   fluticasone 220 MCG/ACT inhaler Commonly known as:  FLOVENT HFA Inhale 1 puff into the lungs 2 (two) times daily.   fluticasone 50 MCG/ACT nasal spray Commonly known as:  FLONASE Place 1 spray into both nostrils daily.   furosemide 20 MG tablet Commonly known as:  LASIX Take 10 mg by mouth daily.   gabapentin 800 MG tablet Commonly  known as:  NEURONTIN Take 1 tablet (800 mg total) by mouth 2 (two) times daily.   levETIRAcetam 500 MG tablet Commonly known as:  KEPPRA Take 1 tablet (500 mg total) by mouth 2 (two) times daily.   levothyroxine 175 MCG tablet Commonly known as:  SYNTHROID, LEVOTHROID Take 1 tablet (175 mcg total) by mouth daily before breakfast.   lisinopril 20 MG tablet Commonly known as:  PRINIVIL,ZESTRIL Take 1 tablet (20 mg total) by mouth daily.   nitroGLYCERIN 0.4 MG SL tablet Commonly known as:  NITROSTAT Place 1 tablet (0.4 mg total) under the tongue every 5 (five) minutes as needed for chest pain.   omeprazole 20 MG capsule Commonly known as:  PRILOSEC Take  20 mg by mouth 2 (two) times daily. What changed:  Another medication with the same name was removed. Continue taking this medication, and follow the directions you see here.   potassium chloride 10 MEQ CR capsule Commonly known as:  MICRO-K Take 1 capsule (10 mEq total) by mouth daily.   predniSONE 10 MG tablet Commonly known as:  DELTASONE 3 tabs po day1; 2 tabs po day2; 1 tab po day 3; 1/2 tab day 4,5   sucralfate 1 g tablet Commonly known as:  CARAFATE TAKE (1) TABLET BY MOUTH FOUR TIMES A DAY   SUMAtriptan 25 MG tablet Commonly known as:  IMITREX Take 1 tablet (25 mg total) by mouth once as needed for up to 1 dose for migraine. May repeat one dose in 2 hours if persists, max in 24 hrs   TYLENOL 325 MG tablet Generic drug:  acetaminophen Take 650 mg by mouth 2 (two) times daily.   umeclidinium bromide 62.5 MCG/INH Aepb Commonly known as:  INCRUSE ELLIPTA Inhale 1 puff into the lungs daily.        DISCHARGE INSTRUCTIONS:  Follow-up PMD  6 days  If you experience worsening of your admission symptoms, develop shortness of breath, life threatening emergency, suicidal or homicidal thoughts you must seek medical attention immediately by calling 911 or calling your MD immediately  if symptoms less severe.  You Must  read complete instructions/literature along with all the possible adverse reactions/side effects for all the Medicines you take and that have been prescribed to you. Take any new Medicines after you have completely understood and accept all the possible adverse reactions/side effects.   Please note  You were cared for by a hospitalist during your hospital stay. If you have any questions about your discharge medications or the care you received while you were in the hospital after you are discharged, you can call the unit and asked to speak with the hospitalist on call if the hospitalist that took care of you is not available. Once you are discharged, your primary care physician will handle any further medical issues. Please note that NO REFILLS for any discharge medications will be authorized once you are discharged, as it is imperative that you return to your primary care physician (or establish a relationship with a primary care physician if you do not have one) for your aftercare needs so that they can reassess your need for medications and monitor your lab values.    Today   CHIEF COMPLAINT:   Chief Complaint  Patient presents with  . Shortness of Breath    HISTORY OF PRESENT ILLNESS:  Samantha Aguirre  is a 72 y.o. female  came in with shortness of breath   VITAL SIGNS:  Blood pressure (!) 159/65, pulse 63, temperature 98 F (36.7 C), temperature source Oral, resp. rate 17, height 5\' 1"  (1.549 m), weight 87.1 kg (192 lb), SpO2 97 %.   PHYSICAL EXAMINATION:  GENERAL:  72 y.o.-year-old patient lying in the bed with no acute distress.  EYES: Pupils equal, round, reactive to light and accommodation. No scleral icterus. Extraocular muscles intact.  HEENT: Head atraumatic, normocephalic. Oropharynx and nasopharynx clear.  NECK:  Supple, no jugular venous distention. No thyroid enlargement, no tenderness.  LUNGS: Decreased breath sounds bilateral bases ,   slight expiratory wheeze at the  base.  No rales,rhonchi or crepitation. No use of accessory muscles of respiration.  CARDIOVASCULAR: S1, S2 normal. No murmurs, rubs, or gallops.  ABDOMEN: Soft, non-tender, non-distended. Bowel  sounds present. No organomegaly or mass.  EXTREMITIES:   trace edema, no cyanosis, or clubbing.  NEUROLOGIC: Cranial nerves II through XII are intact. Muscle strength 5/5 in all extremities. Sensation intact. Gait not checked.  PSYCHIATRIC: The patient is alert and oriented x 3.  SKIN: No obvious rash, lesion, or ulcer.   DATA REVIEW:   CBC Recent Labs  Lab 03/31/17 0841  WBC 6.0  HGB 11.7*  HCT 35.9  PLT 209    Chemistries  Recent Labs  Lab 04/01/17 0553  NA 138  K 3.4*  CL 103  CO2 24  GLUCOSE 228*  BUN 17  CREATININE 0.65  CALCIUM 8.6*  MG 1.8    Cardiac Enzymes Recent Labs  Lab 03/27/17 [place] <0.03    Microbiology Results  Results for orders placed or performed during the hospital encounter of 03/27/17  Culture, blood (routine x 2)     Status: None   Collection Time: 03/27/17 12:44 PM  Result Value Ref Range Status   Specimen Description BLOOD LAC  Final   Special Requests   Final    BOTTLES DRAWN AEROBIC AND ANAEROBIC Blood Culture adequate volume   Culture   Final    NO GROWTH 5 DAYS Performed at [facility], [address] [place], [place] [address]    Report Status 04/01/2017 FINAL  Final  Culture, blood (routine x 2)     Status: None   Collection Time: 03/27/17 12:44 PM  Result Value Ref Range Status   Specimen Description BLOOD RAC  Final   Special Requests   Final    BOTTLES DRAWN AEROBIC AND ANAEROBIC Blood Culture adequate volume   Culture   Final    NO GROWTH 5 DAYS Performed at [facility], [address] [place], [place] [address]    Report Status 04/01/2017 FINAL  Final      Management plans discussed with the patient, and she is in agreement.  CODE STATUS:  Code Status History    Date Active Date  Inactive Code Status Order ID Comments User Context   03/27/2017 1519 04/02/2017 1517 Full Code [id number]  Saundra Shelling, MD Inpatient   02/18/2017 1623 02/21/2017 1919 Full Code [id number]  Gorden Harms, MD Inpatient   10/03/2016 1841 10/04/2016 1739 Full Code [id number]  Demetrios Loll, MD Inpatient   02/27/2016 1744 02/28/2016 2034 Full Code [id number]  Vaughan Basta, MD Inpatient   02/19/2016 1553 02/21/2016 2041 Full Code [id number]  Gladstone Lighter, MD Inpatient   12/27/2015 1309 01/01/2016 1833 DNR [id number]  Loletha Grayer, MD ED   09/16/2015 1852 09/19/2015 1929 Full Code [id number]  Baxter Hire, MD Inpatient   08/16/2015 1714 08/19/2015 1735 DNR [id number]  Willia Craze, NP Inpatient      TOTAL TIME TAKING CARE OF THIS PATIENT: 35 minutes.    Loletha Grayer M.D on 04/02/2017 at 3:42 PM  Between 7am to 6pm - Pager - [phone]  After 6pm go to [email or web address] - Proofreader  [employer] [facility]  [phone]  CC: Primary care physician; Olin Hauser, DO

## 2017-04-02 NOTE — Care Management (Signed)
Samantha Aguirre spoke with Samantha Aguirre regarding transition of care and discharge to home today.  Samantha Aguirre did not make sense. Samantha Aguirre could not complete her sentences. Samantha Aguirre still cannot tell me if Samantha Aguirre has a rolling walker available for home.  Samantha Aguirre could not tell me who was providing transportation to home today and started talking about a dog. I contacted her emergency contact daughter Samantha Aguirre by phone.  Samantha Aguirre had called her for transportation however Samantha Aguirre told Samantha Aguirre to call her friend Samantha Aguirre but Samantha Aguirre couldn't remember Samantha Aguirre per Samantha Aguirre (daughter).  Samantha Aguirre does not want Samantha Aguirre to know that this was shared with [facility] as "Samantha Aguirre will get mad at her".  Per daughter Samantha Aguirre and Samantha Aguirre's son Samantha Aguirre -Samantha Aguirre seems more confused since last time Samantha Aguirre was in hospital for blood transfusion. Samantha Aguirre states they've tried to share this with medical team but Samantha Aguirre gets mad and will not allow them to share their concern with care team. Samantha Aguirre home health has agreed to accepting Samantha Aguirre. I have asked Samantha Aguirre caregiver how Samantha Aguirre is in the home as Samantha Aguirre does live alone.  I spoke with MD regarding my concern.

## 2017-04-02 NOTE — Care Management (Addendum)
RNCM received notification from MD that patient can safely discharge to home today. Patient's daughter updated. RNCM advised daughter Stayce to arrange transportation. At time of this note, patient's friend has arrived to pick patient up- daughter aware.

## 2017-04-02 NOTE — Progress Notes (Signed)
Patients friend arrived to help discharge the patient.  Discharge instructions reviewed and IV removed

## 2017-04-02 NOTE — Discharge Instructions (Addendum)

## 2017-04-02 NOTE — Progress Notes (Signed)
Patient ID: Samantha Aguirre, female   DOB: 05/25/1945, 72 y.o.   MRN: [id number]  Called by Levada Dy about the patient's speech.  Went to see the patient and her speech was fine.  The patient states that her speech sometimes goes in and out at home.  Her strength is fine.  Patient states that she is ready to go home.  Patient has history of atrial fibrillation and is on Eliquis for anticoagulation.  The speech issue potentially can be worsened with steroids that I had her on.  Speech at this time is normal.  Patient stable for discharge.  Dr. Loletha Grayer

## 2017-04-02 NOTE — Care Management Important Message (Signed)
Important Message  Patient Details  Name: Samantha Aguirre MRN: [id number] Date of Birth: [birth year]   Medicare Important Message Given:  Yes    Marshell Garfinkel, RN 04/02/2017, 8:28 AM

## 2017-04-03 ENCOUNTER — Other Ambulatory Visit: Payer: Self-pay

## 2017-04-03 ENCOUNTER — Telehealth: Payer: Self-pay

## 2017-04-03 ENCOUNTER — Other Ambulatory Visit: Payer: Self-pay | Admitting: *Deleted

## 2017-04-03 ENCOUNTER — Emergency Department: Payer: Medicare Other

## 2017-04-03 ENCOUNTER — Inpatient Hospital Stay
Admission: EM | Admit: 2017-04-03 | Discharge: 2017-04-08 | DRG: 190 | Disposition: A | Discharge status: Skilled Nursing Facility | Payer: Medicare Other | Attending: Internal Medicine | Admitting: Internal Medicine

## 2017-04-03 ENCOUNTER — Encounter: Payer: Self-pay | Admitting: Emergency Medicine

## 2017-04-03 DIAGNOSIS — I69320 Aphasia following cerebral infarction: Secondary | ICD-10-CM | POA: Diagnosis not present

## 2017-04-03 DIAGNOSIS — E785 Hyperlipidemia, unspecified: Secondary | ICD-10-CM | POA: Diagnosis present

## 2017-04-03 DIAGNOSIS — Z66 Do not resuscitate: Secondary | ICD-10-CM | POA: Diagnosis present

## 2017-04-03 DIAGNOSIS — R29810 Facial weakness: Secondary | ICD-10-CM | POA: Diagnosis not present

## 2017-04-03 DIAGNOSIS — Z7901 Long term (current) use of anticoagulants: Secondary | ICD-10-CM

## 2017-04-03 DIAGNOSIS — E7800 Pure hypercholesterolemia, unspecified: Secondary | ICD-10-CM | POA: Diagnosis present

## 2017-04-03 DIAGNOSIS — Z823 Family history of stroke: Secondary | ICD-10-CM

## 2017-04-03 DIAGNOSIS — J9601 Acute respiratory failure with hypoxia: Secondary | ICD-10-CM | POA: Diagnosis present

## 2017-04-03 DIAGNOSIS — I639 Cerebral infarction, unspecified: Secondary | ICD-10-CM | POA: Diagnosis not present

## 2017-04-03 DIAGNOSIS — T380X5A Adverse effect of glucocorticoids and synthetic analogues, initial encounter: Secondary | ICD-10-CM | POA: Diagnosis present

## 2017-04-03 DIAGNOSIS — Z79899 Other long term (current) drug therapy: Secondary | ICD-10-CM

## 2017-04-03 DIAGNOSIS — Z885 Allergy status to narcotic agent status: Secondary | ICD-10-CM

## 2017-04-03 DIAGNOSIS — R6889 Other general symptoms and signs: Secondary | ICD-10-CM | POA: Diagnosis not present

## 2017-04-03 DIAGNOSIS — R0602 Shortness of breath: Secondary | ICD-10-CM | POA: Diagnosis not present

## 2017-04-03 DIAGNOSIS — K219 Gastro-esophageal reflux disease without esophagitis: Secondary | ICD-10-CM | POA: Diagnosis present

## 2017-04-03 DIAGNOSIS — M6281 Muscle weakness (generalized): Secondary | ICD-10-CM | POA: Diagnosis not present

## 2017-04-03 DIAGNOSIS — M170 Bilateral primary osteoarthritis of knee: Secondary | ICD-10-CM | POA: Diagnosis present

## 2017-04-03 DIAGNOSIS — Z7189 Other specified counseling: Secondary | ICD-10-CM | POA: Diagnosis not present

## 2017-04-03 DIAGNOSIS — I252 Old myocardial infarction: Secondary | ICD-10-CM | POA: Diagnosis not present

## 2017-04-03 DIAGNOSIS — F17210 Nicotine dependence, cigarettes, uncomplicated: Secondary | ICD-10-CM | POA: Diagnosis present

## 2017-04-03 DIAGNOSIS — R509 Fever, unspecified: Secondary | ICD-10-CM | POA: Diagnosis not present

## 2017-04-03 DIAGNOSIS — Z85828 Personal history of other malignant neoplasm of skin: Secondary | ICD-10-CM | POA: Diagnosis not present

## 2017-04-03 DIAGNOSIS — E1165 Type 2 diabetes mellitus with hyperglycemia: Secondary | ICD-10-CM | POA: Diagnosis present

## 2017-04-03 DIAGNOSIS — R41841 Cognitive communication deficit: Secondary | ICD-10-CM | POA: Diagnosis not present

## 2017-04-03 DIAGNOSIS — Z813 Family history of other psychoactive substance abuse and dependence: Secondary | ICD-10-CM

## 2017-04-03 DIAGNOSIS — Z811 Family history of alcohol abuse and dependence: Secondary | ICD-10-CM

## 2017-04-03 DIAGNOSIS — Z716 Tobacco abuse counseling: Secondary | ICD-10-CM

## 2017-04-03 DIAGNOSIS — Z91018 Allergy to other foods: Secondary | ICD-10-CM | POA: Diagnosis not present

## 2017-04-03 DIAGNOSIS — Z90710 Acquired absence of both cervix and uterus: Secondary | ICD-10-CM

## 2017-04-03 DIAGNOSIS — Z8249 Family history of ischemic heart disease and other diseases of the circulatory system: Secondary | ICD-10-CM

## 2017-04-03 DIAGNOSIS — D509 Iron deficiency anemia, unspecified: Secondary | ICD-10-CM | POA: Diagnosis not present

## 2017-04-03 DIAGNOSIS — F332 Major depressive disorder, recurrent severe without psychotic features: Secondary | ICD-10-CM | POA: Diagnosis not present

## 2017-04-03 DIAGNOSIS — Z7984 Long term (current) use of oral hypoglycemic drugs: Secondary | ICD-10-CM

## 2017-04-03 DIAGNOSIS — R29713 NIHSS score 13: Secondary | ICD-10-CM | POA: Diagnosis not present

## 2017-04-03 DIAGNOSIS — Z803 Family history of malignant neoplasm of breast: Secondary | ICD-10-CM

## 2017-04-03 DIAGNOSIS — Z720 Tobacco use: Secondary | ICD-10-CM | POA: Diagnosis not present

## 2017-04-03 DIAGNOSIS — J441 Chronic obstructive pulmonary disease with (acute) exacerbation: Principal | ICD-10-CM | POA: Diagnosis present

## 2017-04-03 DIAGNOSIS — G8194 Hemiplegia, unspecified affecting left nondominant side: Secondary | ICD-10-CM | POA: Diagnosis present

## 2017-04-03 DIAGNOSIS — R0600 Dyspnea, unspecified: Secondary | ICD-10-CM | POA: Diagnosis not present

## 2017-04-03 DIAGNOSIS — E119 Type 2 diabetes mellitus without complications: Secondary | ICD-10-CM | POA: Diagnosis not present

## 2017-04-03 DIAGNOSIS — Z833 Family history of diabetes mellitus: Secondary | ICD-10-CM

## 2017-04-03 DIAGNOSIS — I482 Chronic atrial fibrillation: Secondary | ICD-10-CM | POA: Diagnosis not present

## 2017-04-03 DIAGNOSIS — Z515 Encounter for palliative care: Secondary | ICD-10-CM | POA: Diagnosis not present

## 2017-04-03 DIAGNOSIS — I2510 Atherosclerotic heart disease of native coronary artery without angina pectoris: Secondary | ICD-10-CM | POA: Diagnosis present

## 2017-04-03 DIAGNOSIS — Z9049 Acquired absence of other specified parts of digestive tract: Secondary | ICD-10-CM

## 2017-04-03 DIAGNOSIS — R4781 Slurred speech: Secondary | ICD-10-CM | POA: Diagnosis not present

## 2017-04-03 DIAGNOSIS — I110 Hypertensive heart disease with heart failure: Secondary | ICD-10-CM | POA: Diagnosis present

## 2017-04-03 DIAGNOSIS — Z79890 Hormone replacement therapy (postmenopausal): Secondary | ICD-10-CM

## 2017-04-03 DIAGNOSIS — R0603 Acute respiratory distress: Secondary | ICD-10-CM | POA: Diagnosis not present

## 2017-04-03 DIAGNOSIS — I63233 Cerebral infarction due to unspecified occlusion or stenosis of bilateral carotid arteries: Secondary | ICD-10-CM | POA: Diagnosis not present

## 2017-04-03 DIAGNOSIS — I5032 Chronic diastolic (congestive) heart failure: Secondary | ICD-10-CM | POA: Diagnosis present

## 2017-04-03 DIAGNOSIS — I6389 Other cerebral infarction: Secondary | ICD-10-CM | POA: Diagnosis not present

## 2017-04-03 DIAGNOSIS — E038 Other specified hypothyroidism: Secondary | ICD-10-CM | POA: Diagnosis present

## 2017-04-03 DIAGNOSIS — Z818 Family history of other mental and behavioral disorders: Secondary | ICD-10-CM

## 2017-04-03 DIAGNOSIS — I480 Paroxysmal atrial fibrillation: Secondary | ICD-10-CM | POA: Diagnosis not present

## 2017-04-03 DIAGNOSIS — E1140 Type 2 diabetes mellitus with diabetic neuropathy, unspecified: Secondary | ICD-10-CM | POA: Diagnosis present

## 2017-04-03 DIAGNOSIS — Z8261 Family history of arthritis: Secondary | ICD-10-CM

## 2017-04-03 DIAGNOSIS — Z91041 Radiographic dye allergy status: Secondary | ICD-10-CM

## 2017-04-03 DIAGNOSIS — Z825 Family history of asthma and other chronic lower respiratory diseases: Secondary | ICD-10-CM

## 2017-04-03 DIAGNOSIS — I63511 Cerebral infarction due to unspecified occlusion or stenosis of right middle cerebral artery: Secondary | ICD-10-CM | POA: Diagnosis not present

## 2017-04-03 DIAGNOSIS — M810 Age-related osteoporosis without current pathological fracture: Secondary | ICD-10-CM | POA: Diagnosis present

## 2017-04-03 LAB — BASIC METABOLIC PANEL
Anion gap: 10 (ref 5–15)
BUN: 19 mg/dL (ref 6–20)
CO2: 24 mmol/L (ref 22–32)
CREATININE: 0.89 mg/dL (ref 0.44–1.00)
Calcium: 8.7 mg/dL — ABNORMAL LOW (ref 8.9–10.3)
Chloride: 104 mmol/L (ref 101–111)
GFR calc Af Amer: >60 mL/min (ref >60)
GFR calc non Af Amer: >60 mL/min (ref >60)
GLUCOSE: 176 mg/dL — AB (ref 65–99)
Potassium: 3.6 mmol/L (ref 3.5–5.1)
Sodium: 138 mmol/L (ref 135–145)

## 2017-04-03 LAB — CBC WITH DIFFERENTIAL/PLATELET
Basophils Absolute: 0.1 10*3/uL (ref 0–0.1)
Basophils Relative: 1 %
EOS PCT: 1 %
Eosinophils Absolute: 0.1 10*3/uL (ref 0–0.7)
HEMATOCRIT: 41.8 % (ref 35.0–47.0)
Hemoglobin: 13.4 g/dL (ref 12.0–16.0)
LYMPHS PCT: 18 %
Lymphs Abs: 2.3 10*3/uL (ref 1.0–3.6)
MCH: 26.3 pg (ref 26.0–34.0)
MCHC: 32 g/dL (ref 32.0–36.0)
MCV: 82.1 fL (ref 80.0–100.0)
MONOS PCT: 5 %
Monocytes Absolute: 0.6 10*3/uL (ref 0.2–0.9)
NEUTROS ABS: 9.4 10*3/uL — AB (ref 1.4–6.5)
Neutrophils Relative %: 75 %
PLATELETS: 335 10*3/uL (ref 150–440)
RBC: 5.08 MIL/uL (ref 3.80–5.20)
RDW: 24.6 % — AB (ref 11.5–14.5)
WBC: 12.5 10*3/uL — ABNORMAL HIGH (ref 3.6–11.0)

## 2017-04-03 LAB — MAGNESIUM: MAGNESIUM: 1.9 mg/dL (ref 1.7–2.4)

## 2017-04-03 LAB — GLUCOSE, CAPILLARY: GLUCOSE-CAPILLARY: 241 mg/dL — AB (ref 65–99)

## 2017-04-03 LAB — TROPONIN I: Troponin I: <0.03 ng/mL (ref <0.03)

## 2017-04-03 LAB — LACTIC ACID, PLASMA: LACTIC ACID, VENOUS: 1.6 mmol/L (ref 0.5–1.9)

## 2017-04-03 MED ORDER — ALBUTEROL SULFATE (2.5 MG/3ML) 0.083% IN NEBU
10.0000 mg | INHALATION_SOLUTION | Freq: Once | RESPIRATORY_TRACT | Status: AC
  Administered 2017-04-03: 10 mg via RESPIRATORY_TRACT
  Filled 2017-04-03: qty 12

## 2017-04-03 MED ORDER — UMECLIDINIUM BROMIDE 62.5 MCG/INH IN AEPB
1.0000 | INHALATION_SPRAY | Freq: Every day | RESPIRATORY_TRACT | Status: DC
  Administered 2017-04-04 – 2017-04-08 (×5): 1 via RESPIRATORY_TRACT
  Filled 2017-04-03: qty 7

## 2017-04-03 MED ORDER — ONDANSETRON HCL 4 MG PO TABS: 4.0000 mg | ORAL_TABLET | Freq: Four times a day (QID) | ORAL | Status: DC | PRN

## 2017-04-03 MED ORDER — PANTOPRAZOLE SODIUM 40 MG PO TBEC
40.0000 mg | DELAYED_RELEASE_TABLET | Freq: Every day | ORAL | Status: DC
  Administered 2017-04-04 – 2017-04-08 (×5): 40 mg via ORAL
  Filled 2017-04-03 (×5): qty 1

## 2017-04-03 MED ORDER — LEVETIRACETAM 500 MG PO TABS
500.0000 mg | ORAL_TABLET | Freq: Two times a day (BID) | ORAL | Status: DC
  Administered 2017-04-03 – 2017-04-08 (×10): 500 mg via ORAL
  Filled 2017-04-03 (×10): qty 1

## 2017-04-03 MED ORDER — SODIUM CHLORIDE 0.9 % IV BOLUS (SEPSIS)
1000.0000 mL | Freq: Once | INTRAVENOUS | Status: AC
  Administered 2017-04-03: 1000 mL via INTRAVENOUS

## 2017-04-03 MED ORDER — INSULIN ASPART 100 UNIT/ML ~~LOC~~ SOLN
0.0000 [IU] | Freq: Every day | SUBCUTANEOUS | Status: DC
  Administered 2017-04-03 – 2017-04-04 (×2): 2 [IU] via SUBCUTANEOUS
  Filled 2017-04-03 (×2): qty 1

## 2017-04-03 MED ORDER — GABAPENTIN 400 MG PO CAPS
800.0000 mg | ORAL_CAPSULE | Freq: Two times a day (BID) | ORAL | Status: DC
  Administered 2017-04-03 – 2017-04-08 (×10): 800 mg via ORAL
  Filled 2017-04-03 (×10): qty 2

## 2017-04-03 MED ORDER — INSULIN ASPART 100 UNIT/ML ~~LOC~~ SOLN
0.0000 [IU] | Freq: Three times a day (TID) | SUBCUTANEOUS | Status: DC
  Administered 2017-04-04: 3 [IU] via SUBCUTANEOUS
  Administered 2017-04-04: 5 [IU] via SUBCUTANEOUS
  Filled 2017-04-03 (×2): qty 1

## 2017-04-03 MED ORDER — IPRATROPIUM-ALBUTEROL 0.5-2.5 (3) MG/3ML IN SOLN
3.0000 mL | Freq: Four times a day (QID) | RESPIRATORY_TRACT | Status: DC
  Administered 2017-04-03 – 2017-04-05 (×8): 3 mL via RESPIRATORY_TRACT
  Filled 2017-04-03 (×7): qty 3

## 2017-04-03 MED ORDER — SUCRALFATE 1 G PO TABS
1.0000 g | ORAL_TABLET | Freq: Three times a day (TID) | ORAL | Status: DC
  Administered 2017-04-03 – 2017-04-08 (×17): 1 g via ORAL
  Filled 2017-04-03 (×20): qty 1

## 2017-04-03 MED ORDER — SODIUM CHLORIDE 0.9% FLUSH
3.0000 mL | Freq: Two times a day (BID) | INTRAVENOUS | Status: DC
  Administered 2017-04-03 – 2017-04-08 (×10): 3 mL via INTRAVENOUS

## 2017-04-03 MED ORDER — IPRATROPIUM-ALBUTEROL 0.5-2.5 (3) MG/3ML IN SOLN
3.0000 mL | Freq: Once | RESPIRATORY_TRACT | Status: AC
  Administered 2017-04-03: 3 mL via RESPIRATORY_TRACT

## 2017-04-03 MED ORDER — FUROSEMIDE 20 MG PO TABS
10.0000 mg | ORAL_TABLET | Freq: Every day | ORAL | Status: DC
  Administered 2017-04-04: 10 mg via ORAL
  Filled 2017-04-03: qty 1

## 2017-04-03 MED ORDER — LISINOPRIL 20 MG PO TABS
20.0000 mg | ORAL_TABLET | Freq: Every day | ORAL | Status: DC
  Administered 2017-04-04 – 2017-04-05 (×2): 20 mg via ORAL
  Filled 2017-04-03 (×2): qty 1

## 2017-04-03 MED ORDER — DULOXETINE HCL 30 MG PO CPEP
60.0000 mg | ORAL_CAPSULE | Freq: Every day | ORAL | Status: DC
  Administered 2017-04-03 – 2017-04-07 (×5): 60 mg via ORAL
  Filled 2017-04-03 (×5): qty 2

## 2017-04-03 MED ORDER — BUDESONIDE 0.5 MG/2ML IN SUSP
2.0000 mL | Freq: Two times a day (BID) | RESPIRATORY_TRACT | Status: DC
  Administered 2017-04-04 – 2017-04-08 (×8): 0.5 mg via RESPIRATORY_TRACT
  Filled 2017-04-03 (×9): qty 2

## 2017-04-03 MED ORDER — METHYLPREDNISOLONE SODIUM SUCC 125 MG IJ SOLR
125.0000 mg | Freq: Once | INTRAMUSCULAR | Status: AC
  Administered 2017-04-03: 125 mg via INTRAVENOUS
  Filled 2017-04-03: qty 2

## 2017-04-03 MED ORDER — ALBUTEROL SULFATE (2.5 MG/3ML) 0.083% IN NEBU: 2.5000 mg | INHALATION_SOLUTION | RESPIRATORY_TRACT | Status: DC | PRN

## 2017-04-03 MED ORDER — SUMATRIPTAN SUCCINATE 25 MG PO TABS
25.0000 mg | ORAL_TABLET | Freq: Once | ORAL | Status: DC | PRN
  Filled 2017-04-03: qty 1

## 2017-04-03 MED ORDER — POTASSIUM CHLORIDE CRYS ER 10 MEQ PO TBCR
10.0000 meq | EXTENDED_RELEASE_TABLET | Freq: Every day | ORAL | Status: DC
  Administered 2017-04-04 – 2017-04-08 (×5): 10 meq via ORAL
  Filled 2017-04-03 (×5): qty 1

## 2017-04-03 MED ORDER — NITROGLYCERIN 0.4 MG SL SUBL: 0.4000 mg | SUBLINGUAL_TABLET | SUBLINGUAL | Status: DC | PRN

## 2017-04-03 MED ORDER — IPRATROPIUM-ALBUTEROL 0.5-2.5 (3) MG/3ML IN SOLN
RESPIRATORY_TRACT | Status: AC
  Administered 2017-04-03: 3 mL via RESPIRATORY_TRACT
  Filled 2017-04-03: qty 9

## 2017-04-03 MED ORDER — SODIUM CHLORIDE 0.9% FLUSH
3.0000 mL | INTRAVENOUS | Status: DC | PRN
  Administered 2017-04-04: 3 mL via INTRAVENOUS
  Filled 2017-04-03: qty 3

## 2017-04-03 MED ORDER — LEVOTHYROXINE SODIUM 50 MCG PO TABS
175.0000 ug | ORAL_TABLET | Freq: Every day | ORAL | Status: DC
  Administered 2017-04-04 – 2017-04-08 (×5): 175 ug via ORAL
  Filled 2017-04-03 (×5): qty 1

## 2017-04-03 MED ORDER — METHYLPREDNISOLONE SODIUM SUCC 40 MG IJ SOLR
40.0000 mg | Freq: Four times a day (QID) | INTRAMUSCULAR | Status: DC
  Administered 2017-04-03 – 2017-04-05 (×7): 40 mg via INTRAVENOUS
  Filled 2017-04-03 (×7): qty 1

## 2017-04-03 MED ORDER — APIXABAN 5 MG PO TABS
5.0000 mg | ORAL_TABLET | Freq: Two times a day (BID) | ORAL | Status: DC
  Administered 2017-04-03 – 2017-04-06 (×6): 5 mg via ORAL
  Filled 2017-04-03 (×6): qty 1

## 2017-04-03 MED ORDER — SODIUM CHLORIDE 0.9 % IV SOLN: 250.0000 mL | INTRAVENOUS | Status: DC | PRN

## 2017-04-03 MED ORDER — ATORVASTATIN CALCIUM 20 MG PO TABS
80.0000 mg | ORAL_TABLET | Freq: Every day | ORAL | Status: DC
  Administered 2017-04-04 – 2017-04-08 (×4): 80 mg via ORAL
  Filled 2017-04-03: qty 8
  Filled 2017-04-03 (×3): qty 4

## 2017-04-03 MED ORDER — FERROUS SULFATE 325 (65 FE) MG PO TABS
325.0000 mg | ORAL_TABLET | Freq: Two times a day (BID) | ORAL | Status: DC
  Administered 2017-04-04 – 2017-04-08 (×9): 325 mg via ORAL
  Filled 2017-04-03 (×9): qty 1

## 2017-04-03 MED ORDER — ONDANSETRON HCL 4 MG/2ML IJ SOLN: 4.0000 mg | Freq: Four times a day (QID) | INTRAMUSCULAR | Status: DC | PRN

## 2017-04-03 MED ORDER — GUAIFENESIN-DM 100-10 MG/5ML PO SYRP: 5.0000 mL | ORAL_SOLUTION | ORAL | Status: DC | PRN

## 2017-04-03 NOTE — Patient Outreach (Signed)
Referral received 03/31/17 from [facility] liaison for Samantha Aguirre, 72  Year old female for  Community CM services/transition of care- follow up on  recent hospitalization March 15-21,2019 for flu, cough,COPD exacerbation.    This RN CM was planning to follow up with pt today but view in EMR saw  Pt admitted to ED.   Plan:  RN CM to follow up at discharge.    [name].   [place] [facility]  [phone]

## 2017-04-03 NOTE — ED Notes (Signed)
Ambulated pt to monitor HR and oxygen levels. Pt oxygen on room air went as low as 91%. HR jumped up into 120's. Respirations went up to 40 a minute. Pt could not complete ambulation, had to sit down half way through and rest. Steady gait noted.

## 2017-04-03 NOTE — ED Provider Notes (Signed)
[facility] Emergency Department Provider Note  ____________________________________________  Time seen: Approximately 4:24 PM  I have reviewed the triage vital signs and the nursing notes.   HISTORY  Chief Complaint Influenza   HPI Samantha Aguirre is a 72 y.o. female with a history of COPD, diastolic CHF, diabetes, hypertension, coronary artery disease who presents for evaluation of shortness of breath.  Patient was discharged from the hospital yesterday after being admitted for 6 days for COPD exacerbation, acute respiratory failure in the setting of influenza A positive.  Patient finished course of Tamiflu in the hospital.  Was sent home with an inhaler.  She is not on antibiotics.  Since she went home yesterday she has been feeling progressively worsening shortness of breath which became constant and severe today.  She has been using her inhalers.  She continues to cough and have significant wheezing.  Her cough is productive of green phlegm.  She denies fever or chills, vomiting.  She does endorse 5 or 6 episodes of diarrhea since she went home.  She is also complaining of central chest tightness that has been constant, mild to moderate since this morning.   Past Medical History:  Diagnosis Date  . Acid reflux   . Arthritis    Bilateral knees, hands, feet  . Asthma   . Atrial fibrillation ([place])   . Cancer ([place])   . Chest pain   . CHF (congestive heart failure) (HCC)    Diastolic CHF  . COPD (chronic obstructive pulmonary disease) ([place])   . Coronary artery disease   . Diabetes mellitus without complication ([place])   . Frequent headaches   . Heart attack ([place])   . High cholesterol   . HLD (hyperlipidemia)   . Hypertension   . MI (myocardial infarction) ([place])   . Seizures ([place])   . Skin cancer 2015   Suspected basal cell carcinoma on nose, surgically removed  . Stroke ([place])   . Thyroid disease   . Tremors of nervous system     Patient Active  Problem List   Diagnosis Date Noted  . Flu 03/27/2017  . Iron deficiency anemia due to chronic blood loss   . Migraine 02/13/2017  . Cataracts, bilateral 01/15/2017  . Chronic venous insufficiency 10/06/2016  . Recurrent major depressive disorder, in partial remission ([place]) 10/06/2016  . Left-sided weakness 10/03/2016  . Chest pain 02/27/2016  . History of cerebrovascular accident (CVA) with residual deficit 02/19/2016  . Thumb pain, left 01/22/2016  . Traumatic closed nondisplaced fracture of base of metacarpal bone of left thumb 01/22/2016  . Colon cancer screening 11/27/2015  . Depression 11/14/2015  . Coronary artery disease 11/13/2015  . Hypertension 11/13/2015  . History of skin cancer 11/13/2015  . Osteoporosis 11/13/2015  . Chronic low back pain 11/13/2015  . Left medial knee pain 11/13/2015  . Osteoarthritis of multiple joints 11/13/2015  . Other specified hypothyroidism 08/16/2015  . Controlled type 2 diabetes mellitus with diabetic neuropathy ([place]) 08/16/2015  . COPD (chronic obstructive pulmonary disease) ([place]) 08/16/2015  . Tobacco abuse 08/16/2015  . HLD (hyperlipidemia) 08/16/2015  . History of CHF (congestive heart failure) 08/16/2015  . Seizures ([place])     Past Surgical History:  Procedure Laterality Date  . ABDOMINAL HYSTERECTOMY  1976  . APPENDECTOMY  1980  . CAROTID ENDARTERECTOMY    . CHOLECYSTECTOMY  1980  . COLONOSCOPY N/A 02/20/2017   Procedure: COLONOSCOPY;  Surgeon: Lin Landsman, MD;  Location: [facility] ENDOSCOPY;  Service: Gastroenterology;  Laterality: N/A;  . ESOPHAGOGASTRODUODENOSCOPY N/A 02/20/2017   Procedure: ESOPHAGOGASTRODUODENOSCOPY (EGD);  Surgeon: Lin Landsman, MD;  Location: [facility] ENDOSCOPY;  Service: Gastroenterology;  Laterality: N/A;  . [place]  . KNEE SURGERY     left  . ROTATOR CUFF REPAIR     right  . TONSILLECTOMY  1950    Prior to Admission medications   Medication Sig Start Date End Date Taking?  Authorizing Provider  acetaminophen (TYLENOL) 325 MG tablet Take 650 mg by mouth 2 (two) times daily.     [provider]  albuterol (PROVENTIL HFA;VENTOLIN HFA) 108 (90 Base) MCG/ACT inhaler Inhale 2 puffs into the lungs every 6 (six) hours as needed for wheezing or shortness of breath. 10/08/16   Karamalegos, Devonne Doughty, DO  apixaban (ELIQUIS) 5 MG TABS tablet Take 5 mg by mouth every 12 (twelve) hours.    [provider]  atorvastatin (LIPITOR) 80 MG tablet Take 1 tablet (80 mg total) by mouth daily at 6 PM. 09/24/16   Karamalegos, Devonne Doughty, DO  diclofenac sodium (VOLTAREN) 1 % GEL Apply 2 g topically 4 (four) times daily. 01/11/16   Daymon Larsen, MD  DULoxetine (CYMBALTA) 60 MG capsule Take 1 capsule (60 mg total) by mouth at bedtime. 09/24/16   Karamalegos, Devonne Doughty, DO  ferrous sulfate 325 (65 FE) MG EC tablet Take 1 tablet (325 mg total) by mouth 2 (two) times daily with a meal. 02/21/17   Sudini, Srikar, MD  fluticasone (FLONASE) 50 MCG/ACT nasal spray Place 1 spray into both nostrils daily. 10/08/16   Karamalegos, Alexander J, DO  fluticasone (FLOVENT HFA) 220 MCG/ACT inhaler Inhale 1 puff into the lungs 2 (two) times daily. 04/02/17   Loletha Grayer, MD  furosemide (LASIX) 20 MG tablet Take 10 mg by mouth daily.     Karamalegos, Alexander J, DO  gabapentin (NEURONTIN) 800 MG tablet Take 1 tablet (800 mg total) by mouth 2 (two) times daily. 09/24/16   Karamalegos, Devonne Doughty, DO  glipiZIDE-metformin (METAGLIP) 5-500 MG tablet Take 1 tablet by mouth 2 (two) times daily before a meal. 04/02/17   Karamalegos, Devonne Doughty, DO  levETIRAcetam (KEPPRA) 500 MG tablet Take 1 tablet (500 mg total) by mouth 2 (two) times daily. 09/19/15   Epifanio Lesches, MD  levothyroxine (SYNTHROID, LEVOTHROID) 175 MCG tablet Take 1 tablet (175 mcg total) by mouth daily before breakfast. 09/24/16   Karamalegos, Devonne Doughty, DO  lisinopril (PRINIVIL,ZESTRIL) 20 MG tablet Take 1 tablet (20 mg  total) by mouth daily. 10/08/16   Karamalegos, Devonne Doughty, DO  nitroGLYCERIN (NITROSTAT) 0.4 MG SL tablet Place 1 tablet (0.4 mg total) under the tongue every 5 (five) minutes as needed for chest pain. 02/28/16   Bettey Costa, MD  omeprazole (PRILOSEC) 20 MG capsule Take 20 mg by mouth 2 (two) times daily.    [provider]  potassium chloride (MICRO-K) 10 MEQ CR capsule Take 1 capsule (10 mEq total) by mouth daily. 09/24/16   Karamalegos, Devonne Doughty, DO  predniSONE (DELTASONE) 10 MG tablet 3 tabs po day1; 2 tabs po day2; 1 tab po day 3; 1/2 tab day 4,5 04/02/17   Leslye Peer, Richard, MD  sucralfate (CARAFATE) 1 g tablet TAKE (1) TABLET BY MOUTH FOUR TIMES A DAY 03/26/17   Karamalegos, Devonne Doughty, DO  SUMAtriptan (IMITREX) 25 MG tablet Take 1 tablet (25 mg total) by mouth once as needed for up to 1 dose for migraine. May repeat one dose in 2 hours  if persists, max in 24 hrs 02/13/17   Karamalegos, Devonne Doughty, DO  umeclidinium bromide (INCRUSE ELLIPTA) 62.5 MCG/INH AEPB Inhale 1 puff into the lungs daily. 11/13/15   Karamalegos, Devonne Doughty, DO    Allergies Mushroom extract complex; Atenolol; Ivp dye [iodinated diagnostic agents]; Morphine and related; Percocet [oxycodone-acetaminophen]; Percodan [oxycodone-aspirin]; and Verapamil  Family History  Problem Relation Age of Onset  . Breast cancer Mother   . Heart disease Mother   . Stroke Mother   . Cancer Mother   . COPD Mother   . Heart disease Father   . Diabetes Father   . Stroke Father   . Alcohol abuse Sister   . Drug abuse Sister   . Stroke Sister   . Cancer Sister   . Mental illness Sister   . Heart disease Brother   . Arthritis Brother   . Diabetes Brother   . Heart disease Maternal Grandfather   . Heart disease Paternal Grandfather     Social History Social History   Tobacco Use  . Smoking status: Current Every Day Smoker    Packs/day: 0.50    Years: 50.00    Pack years: 25.00    Types: Cigarettes  . Smokeless  tobacco: Current User  Substance Use Topics  . Alcohol use: No  . Drug use: No    Review of Systems  Constitutional: Negative for fever. Eyes: Negative for visual changes. ENT: Negative for sore throat. Neck: No neck pain  Cardiovascular: + chest pain. Respiratory: + shortness of breath, cough, wheezing Gastrointestinal: Negative for abdominal pain, vomiting. + diarrhea. Genitourinary: Negative for dysuria. Musculoskeletal: Negative for back pain. Skin: Negative for rash. Neurological: Negative for headaches, weakness or numbness. Psych: No SI or HI  ____________________________________________   PHYSICAL EXAM:  VITAL SIGNS: ED Triage Vitals  Enc Vitals Group     BP 04/03/17 1553 109/65     Pulse Rate 04/03/17 1553 80     Resp 04/03/17 1553 18     Temp 04/03/17 1553 (!) 97.4 F (36.3 C)     Temp Source 04/03/17 1553 Oral     SpO2 04/03/17 1551 94 %     Weight 04/03/17 1554 192 lb (87.1 kg)     Height --      Head Circumference --      Peak Flow --      Pain Score 04/03/17 1553 9     Pain Loc --      Pain Edu? --      Excl. in [place]? --     Constitutional: Alert and oriented, mild respiratory distress.  HEENT:      Head: Normocephalic and atraumatic.         Eyes: Conjunctivae are normal. Sclera is non-icteric.       Mouth/Throat: Mucous membranes are moist.       Neck: Supple with no signs of meningismus. Cardiovascular: Regular rate and rhythm. No murmurs, gallops, or rubs. 2+ symmetrical distal pulses are present in all extremities. No JVD. Respiratory: Increased work of breathing, coarse rhonchi bilaterally and expiratory wheezes throughout, normal sats Gastrointestinal: Soft, non tender, and non distended with positive bowel sounds. No rebound or guarding. Musculoskeletal: Nontender with normal range of motion in all extremities. No edema, cyanosis, or erythema of extremities. Neurologic: Normal speech and language. Face is symmetric. Moving all extremities. No  gross focal neurologic deficits are appreciated. Skin: Skin is warm, dry and intact. No rash noted. Psychiatric: Mood and affect are normal. Speech  and behavior are normal.  ____________________________________________   LABS (all labs ordered are listed, but only abnormal results are displayed)  Labs Reviewed  CBC WITH DIFFERENTIAL/PLATELET - Abnormal; Notable for the following components:      Result Value   WBC 12.5 (*)    RDW 24.6 (*)    Neutro Abs 9.4 (*)    All other components within normal limits  BASIC METABOLIC PANEL - Abnormal; Notable for the following components:   Glucose, Bld 176 (*)    Calcium 8.7 (*)    All other components within normal limits  BLOOD GAS, VENOUS - Abnormal; Notable for the following components:   pCO2, Ven 42 (*)    All other components within normal limits  C DIFFICILE QUICK SCREEN W PCR REFLEX  TROPONIN I  LACTIC ACID, PLASMA   ____________________________________________  EKG  ED ECG REPORT I, Rudene Re, the attending physician, personally viewed and interpreted this ECG.  Normal sinus rhythm, rate of 96, normal intervals, normal axis, no ST elevations or depressions.  No significant changes when compared to prior ____________________________________________  RADIOLOGY  I have personally reviewed the images performed during this visit and I agree with the Radiologist's read.   Interpretation by Radiologist:  Dg Chest Portable 1 View  Result Date: 04/03/2017 CLINICAL DATA:  Shortness of breath and wheezing EXAM: PORTABLE CHEST 1 VIEW COMPARISON:  03/27/2017, 02/18/2017 FINDINGS: The heart size and mediastinal contours are within normal limits. Both lungs are clear. Aortic atherosclerosis. The visualized skeletal structures are unremarkable. IMPRESSION: No active disease. Electronically Signed   By: Donavan Foil M.D.   On: 04/03/2017 16:36     ____________________________________________   PROCEDURES  Procedure(s)  performed: None Procedures Critical Care performed:  None ____________________________________________   INITIAL IMPRESSION / ASSESSMENT AND PLAN / ED COURSE   72 y.o. female with a history of COPD, diastolic CHF, diabetes, hypertension, coronary artery disease who presents for evaluation of shortness of breath, cough, wheezing.  Patient arrives with mild respiratory distress, normal sats, diffuse wheezes and coarse rhonchi bilaterally, she has no fever, No tachycardia, no hypoxia. patient was started on duo nebs, solumedrol for COPD exacerbation. CXR negative for PNA. VBG showing 7.41/42.  Patient finished course of Tamiflu, rocephin and azithromycin course in hospital.  Presentation concerning for bronchitis/COPD exacerbation.   Will monitor and reassess after breathing treatments and labs.     _________________________ 5:50 PM on 04/03/2017 -----------------------------------------  White count slightly elevated at 12.5 in the setting of recent steroids.  Patient remains afebrile.  Lactic is normal at 1.6.  BMP is at baseline, troponin is negative.  After 3 DuoNeb's patient continues to have significant tachypnea and wheezing with minimal exertion although oxygen levels have remained stable above 91% on room air.  Will start patient on 10 mg of albuterol an hour and admit to the hospitalist service for COPD exacerbation.   As part of my medical decision making, I reviewed the following data within the [place] notes reviewed and incorporated, Labs reviewed , EKG interpreted , Old EKG reviewed, Old chart reviewed, Radiograph reviewed , Discussed with admitting physician , Notes from prior ED visits and [place] Controlled Substance Database    Pertinent labs & imaging results that were available during my care of the patient were reviewed by me and considered in my medical decision making (see chart for  details).    ____________________________________________   FINAL CLINICAL IMPRESSION(S) / ED DIAGNOSES  Final diagnoses:  COPD exacerbation (  [place])  Respiratory distress      NEW MEDICATIONS STARTED DURING THIS VISIT:  ED Discharge Orders    None       Note:  This document was prepared using Dragon voice recognition software and may include unintentional dictation errors.    Alfred Levins, [place], MD 04/03/17 [phone]

## 2017-04-03 NOTE — ED Notes (Signed)
Pt oxygen dropped to 88% on room air. Placed on 2 L nasal cannula, came up to 96%. Will continue to monitor.

## 2017-04-03 NOTE — ED Triage Notes (Addendum)
PT to ED via EMS from home, was dx with influenza x2days ago, pt states increased fatigue and SOB. PT has wheezes noted. PT A&Ox4, VSS . Pt had rx for inhaler and prednisone but has not filled them yet.

## 2017-04-03 NOTE — H&P (Addendum)
[place] [facility] [place] NAME: Samantha Aguirre    MR#:  [id number]  DATE OF BIRTH:  Jan 26, 1945  DATE OF ADMISSION:  04/03/2017  PRIMARY CARE PHYSICIAN: Katheren Shams   REQUESTING/REFERRING PHYSICIAN: Rudene Re, MD  CHIEF COMPLAINT:   Chief Complaint  Patient presents with  . Influenza   Worsening shortness of breath today. HISTORY OF PRESENT ILLNESS:  Samantha Aguirre  is a 72 y.o. female with a known history of multiple medical problems as below.  Patient was just discharged to home after treatment of COPD exacerbation yesterday.  She feels worsening shortness of breath since this morning.  She used nebulizer without improvement.  She came to the ED and found wheezing, shortness of breath, tachycardia and tachypnea due to COPD exacerbation.  She is treated with IV Solu-Medrol and nebulizer with little improvement.  ED physician request admission.  PAST MEDICAL HISTORY:   Past Medical History:  Diagnosis Date  . Acid reflux   . Arthritis    Bilateral knees, hands, feet  . Asthma   . Atrial fibrillation ([place])   . Cancer ([place])   . Chest pain   . CHF (congestive heart failure) (HCC)    Diastolic CHF  . COPD (chronic obstructive pulmonary disease) ([place])   . Coronary artery disease   . Diabetes mellitus without complication ([place])   . Frequent headaches   . Heart attack ([place])   . High cholesterol   . HLD (hyperlipidemia)   . Hypertension   . MI (myocardial infarction) ([place])   . Seizures ([place])   . Skin cancer 2015   Suspected basal cell carcinoma on nose, surgically removed  . Stroke ([place])   . Thyroid disease   . Tremors of nervous system     PAST SURGICAL HISTORY:   Past Surgical History:  Procedure Laterality Date  . ABDOMINAL HYSTERECTOMY  1976  . APPENDECTOMY  1980  . CAROTID ENDARTERECTOMY    . CHOLECYSTECTOMY  1980  . COLONOSCOPY N/A 02/20/2017   Procedure: COLONOSCOPY;  Surgeon: Lin Landsman,  MD;  Location: [facility] ENDOSCOPY;  Service: Gastroenterology;  Laterality: N/A;  . ESOPHAGOGASTRODUODENOSCOPY N/A 02/20/2017   Procedure: ESOPHAGOGASTRODUODENOSCOPY (EGD);  Surgeon: Lin Landsman, MD;  Location: [facility] ENDOSCOPY;  Service: Gastroenterology;  Laterality: N/A;  . [place]  . KNEE SURGERY     left  . ROTATOR CUFF REPAIR     right  . TONSILLECTOMY  1950    SOCIAL HISTORY:   Social History   Tobacco Use  . Smoking status: Current Every Day Smoker    Packs/day: 0.50    Years: 50.00    Pack years: 25.00    Types: Cigarettes  . Smokeless tobacco: Current User  Substance Use Topics  . Alcohol use: No    FAMILY HISTORY:   Family History  Problem Relation Age of Onset  . Breast cancer Mother   . Heart disease Mother   . Stroke Mother   . Cancer Mother   . COPD Mother   . Heart disease Father   . Diabetes Father   . Stroke Father   . Alcohol abuse Sister   . Drug abuse Sister   . Stroke Sister   . Cancer Sister   . Mental illness Sister   . Heart disease Brother   . Arthritis Brother   . Diabetes Brother   . Heart disease Maternal Grandfather   . Heart disease Paternal Grandfather  DRUG ALLERGIES:   Allergies  Allergen Reactions  . Mushroom Extract Complex Anaphylaxis    Made patient "deathly sick"  . Atenolol     "MD took me off of it bc it was doing something wrong" Made patient HYPOTENSIVE  . Ivp Dye [Iodinated Diagnostic Agents] Itching    Pt injected with IV contrast.  5 min after injection pt complained of itching behind ear and on her abd.  . Morphine And Related Other (See Comments)    "stops my breathing" NEAR-RESPIRATORY ARREST  . Percocet [Oxycodone-Acetaminophen] Nausea And Vomiting and Other (See Comments)    Stomach pains  . Percodan [Oxycodone-Aspirin] Nausea And Vomiting and Other (See Comments)    Stomach pains  . Verapamil Other (See Comments)    " MD told me this was screwing me up" MAKES PATIENT  HYPOTENSIVE    REVIEW OF SYSTEMS:   Review of Systems  Constitutional: Positive for malaise/fatigue. Negative for chills and fever.  HENT: Negative for sore throat.   Eyes: Negative for blurred vision and double vision.  Respiratory: Positive for cough, shortness of breath and wheezing. Negative for hemoptysis, sputum production and stridor.   Cardiovascular: Negative for chest pain, palpitations, orthopnea and leg swelling.  Gastrointestinal: Positive for diarrhea. Negative for abdominal pain, blood in stool, melena, nausea and vomiting.  Genitourinary: Negative for dysuria, flank pain and hematuria.  Musculoskeletal: Negative for back pain and joint pain.  Skin: Negative for rash.  Neurological: Negative for dizziness, sensory change, focal weakness, seizures, loss of consciousness, weakness and headaches.  Endo/Heme/Allergies: Negative for polydipsia.  Psychiatric/Behavioral: Negative for depression. The patient is not nervous/anxious.     MEDICATIONS AT HOME:   Prior to Admission medications   Medication Sig Start Date End Date Taking? Authorizing Provider  acetaminophen (TYLENOL) 325 MG tablet Take 650 mg by mouth 2 (two) times daily.     [provider]  albuterol (PROVENTIL HFA;VENTOLIN HFA) 108 (90 Base) MCG/ACT inhaler Inhale 2 puffs into the lungs every 6 (six) hours as needed for wheezing or shortness of breath. 10/08/16   Karamalegos, Devonne Doughty, DO  apixaban (ELIQUIS) 5 MG TABS tablet Take 5 mg by mouth every 12 (twelve) hours.    [provider]  atorvastatin (LIPITOR) 80 MG tablet Take 1 tablet (80 mg total) by mouth daily at 6 PM. 09/24/16   Karamalegos, Devonne Doughty, DO  diclofenac sodium (VOLTAREN) 1 % GEL Apply 2 g topically 4 (four) times daily. 01/11/16   Daymon Larsen, MD  DULoxetine (CYMBALTA) 60 MG capsule Take 1 capsule (60 mg total) by mouth at bedtime. 09/24/16   Karamalegos, Devonne Doughty, DO  ferrous sulfate 325 (65 FE) MG EC tablet Take 1  tablet (325 mg total) by mouth 2 (two) times daily with a meal. 02/21/17   Sudini, Srikar, MD  fluticasone (FLONASE) 50 MCG/ACT nasal spray Place 1 spray into both nostrils daily. 10/08/16   Karamalegos, Alexander J, DO  fluticasone (FLOVENT HFA) 220 MCG/ACT inhaler Inhale 1 puff into the lungs 2 (two) times daily. 04/02/17   Loletha Grayer, MD  furosemide (LASIX) 20 MG tablet Take 10 mg by mouth daily.     Karamalegos, Alexander J, DO  gabapentin (NEURONTIN) 800 MG tablet Take 1 tablet (800 mg total) by mouth 2 (two) times daily. 09/24/16   Karamalegos, Devonne Doughty, DO  glipiZIDE-metformin (METAGLIP) 5-500 MG tablet Take 1 tablet by mouth 2 (two) times daily before a meal. 04/02/17   Karamalegos, Devonne Doughty, DO  levETIRAcetam (  KEPPRA) 500 MG tablet Take 1 tablet (500 mg total) by mouth 2 (two) times daily. 09/19/15   Epifanio Lesches, MD  levothyroxine (SYNTHROID, LEVOTHROID) 175 MCG tablet Take 1 tablet (175 mcg total) by mouth daily before breakfast. 09/24/16   Karamalegos, Devonne Doughty, DO  lisinopril (PRINIVIL,ZESTRIL) 20 MG tablet Take 1 tablet (20 mg total) by mouth daily. 10/08/16   Karamalegos, Devonne Doughty, DO  nitroGLYCERIN (NITROSTAT) 0.4 MG SL tablet Place 1 tablet (0.4 mg total) under the tongue every 5 (five) minutes as needed for chest pain. 02/28/16   Bettey Costa, MD  omeprazole (PRILOSEC) 20 MG capsule Take 20 mg by mouth 2 (two) times daily.    [provider]  potassium chloride (MICRO-K) 10 MEQ CR capsule Take 1 capsule (10 mEq total) by mouth daily. 09/24/16   Karamalegos, Devonne Doughty, DO  predniSONE (DELTASONE) 10 MG tablet 3 tabs po day1; 2 tabs po day2; 1 tab po day 3; 1/2 tab day 4,5 04/02/17   Leslye Peer, Richard, MD  sucralfate (CARAFATE) 1 g tablet TAKE (1) TABLET BY MOUTH FOUR TIMES A DAY 03/26/17   Karamalegos, Devonne Doughty, DO  SUMAtriptan (IMITREX) 25 MG tablet Take 1 tablet (25 mg total) by mouth once as needed for up to 1 dose for migraine. May repeat one dose in 2 hours  if persists, max in 24 hrs 02/13/17   Karamalegos, Devonne Doughty, DO  umeclidinium bromide (INCRUSE ELLIPTA) 62.5 MCG/INH AEPB Inhale 1 puff into the lungs daily. 11/13/15   Karamalegos, Devonne Doughty, DO      VITAL SIGNS:  Blood pressure 109/65, pulse 80, temperature (!) 97.4 F (36.3 C), temperature source Oral, resp. rate 18, weight 192 lb (87.1 kg), SpO2 96 %.  PHYSICAL EXAMINATION:  Physical Exam  GENERAL:  72 y.o.-year-old patient lying in the bed with no acute distress.  EYES: Pupils equal, round, reactive to light and accommodation. No scleral icterus. Extraocular muscles intact.  HEENT: Head atraumatic, normocephalic. Oropharynx and nasopharynx clear.  NECK:  Supple, no jugular venous distention. No thyroid enlargement, no tenderness.  LUNGS: Normal breath sounds bilaterally, bilateral expiratory wheezing and rhonchi. No use of accessory muscles of respiration.  CARDIOVASCULAR: S1, S2 normal. No murmurs, rubs, or gallops.  ABDOMEN: Soft, nontender, nondistended. Bowel sounds present. No organomegaly or mass.  EXTREMITIES: No pedal edema, cyanosis, or clubbing.  NEUROLOGIC: Cranial nerves II through XII are intact. Muscle strength 5/5 in all extremities. Sensation intact. Gait not checked.  PSYCHIATRIC: The patient is alert and oriented x 3.  SKIN: No obvious rash, lesion, or ulcer.   LABORATORY PANEL:   CBC Recent Labs  Lab 04/03/17 1603  WBC 12.5*  HGB 13.4  HCT 41.8  PLT 335   ------------------------------------------------------------------------------------------------------------------  Chemistries  Recent Labs  Lab 04/01/17 0553 04/03/17 1603  NA 138 138  K 3.4* 3.6  CL 103 104  CO2 24 24  GLUCOSE 228* 176*  BUN 17 19  CREATININE 0.65 0.89  CALCIUM 8.6* 8.7*  MG 1.8  --    ------------------------------------------------------------------------------------------------------------------  Cardiac Enzymes Recent Labs  Lab 04/03/17 1603  TROPONINI <0.03    ------------------------------------------------------------------------------------------------------------------  RADIOLOGY:  Dg Chest Portable 1 View  Result Date: 04/03/2017 CLINICAL DATA:  Shortness of breath and wheezing EXAM: PORTABLE CHEST 1 VIEW COMPARISON:  03/27/2017, 02/18/2017 FINDINGS: The heart size and mediastinal contours are within normal limits. Both lungs are clear. Aortic atherosclerosis. The visualized skeletal structures are unremarkable. IMPRESSION: No active disease. Electronically Signed   By: Donavan Foil  M.D.   On: 04/03/2017 16:36      IMPRESSION AND PLAN:   COPD exacerbation. The patient will be admitted to medical floor. Continue IV Solu-Medrol, DuoNeb every 6 hours and home nebulizer.  Chronic diastolic CHF.  Stable, continue Lasix. Mild leukocytosis, due to steroid. Chronic A. fib.  Continue Eliquis. Diabetes.  Start sliding scale. Tobacco abuse.  Smoking cessation was counseled for 4 minutes.  All the records are reviewed and case discussed with ED provider. Management plans discussed with the patient, family and they are in agreement.  CODE STATUS: DNR.  TOTAL TIME TAKING CARE OF THIS PATIENT: 57 minutes.    Demetrios Loll M.D on 04/03/2017 at 6:36 PM  Between 7am to 6pm - Pager - [phone]  After 6pm go to [email or web address] - Proofreader  [facility]  Office  [phone]  CC: Primary care physician; Katheren Shams   Note: This dictation was prepared with Dragon dictation along with smaller phrase technology. Any transcriptional errors that result from this process are unin

## 2017-04-03 NOTE — Telephone Encounter (Signed)
Transition Care Management Follow-up Telephone Call   Date discharged? 04/02/2017   How have you been since you were released from the hospital? "not feeling well today, weak and still have some wheezing"   Do you understand why you were in the hospital? yes   Do you understand the discharge instructions? yes   Where were you discharged to? home   Items Reviewed:  Medications reviewed: yes  Allergies reviewed: yes  Dietary changes reviewed: yes  Referrals reviewed: yes   Functional Questionnaire:   Activities of Daily Living (ADLs):   She states they are independent in the following: ambulation, feeding, continence, grooming and toileting States they require assistance with the following: bathing and hygiene and dressing   Any transportation issues/concerns?: no   Any patient concerns? Yes- just feeling weak, PT arrived while on phone.    Confirmed importance and date/time of follow-up visits scheduled yes  Provider Appointment booked with Dr.Karamelegos on 04/06/2017 at 11:00am   Confirmed with patient if condition begins to worsen call PCP or go to the ER.  Patient was given the office number and encouraged to call back with question or concerns.  : yes

## 2017-04-03 NOTE — ED Notes (Signed)
Admitting at bedside 

## 2017-04-04 LAB — BASIC METABOLIC PANEL
ANION GAP: 10 (ref 5–15)
BUN: 17 mg/dL (ref 6–20)
CALCIUM: 8.9 mg/dL (ref 8.9–10.3)
CO2: 25 mmol/L (ref 22–32)
CREATININE: 0.67 mg/dL (ref 0.44–1.00)
Chloride: 105 mmol/L (ref 101–111)
GLUCOSE: 285 mg/dL — AB (ref 65–99)
Potassium: 4.6 mmol/L (ref 3.5–5.1)
Sodium: 140 mmol/L (ref 135–145)

## 2017-04-04 LAB — CBC
HCT: 36.6 % (ref 35.0–47.0)
Hemoglobin: 11.7 g/dL — ABNORMAL LOW (ref 12.0–16.0)
MCH: 26.2 pg (ref 26.0–34.0)
MCHC: 31.9 g/dL — AB (ref 32.0–36.0)
MCV: 82.3 fL (ref 80.0–100.0)
Platelets: 267 10*3/uL (ref 150–440)
RBC: 4.45 MIL/uL (ref 3.80–5.20)
RDW: 25.2 % — ABNORMAL HIGH (ref 11.5–14.5)
WBC: 15.2 10*3/uL — ABNORMAL HIGH (ref 3.6–11.0)

## 2017-04-04 LAB — GLUCOSE, CAPILLARY
GLUCOSE-CAPILLARY: 283 mg/dL — AB (ref 65–99)
GLUCOSE-CAPILLARY: 336 mg/dL — AB (ref 65–99)
GLUCOSE-CAPILLARY: 241 mg/dL — AB (ref 65–99)
Glucose-Capillary: 237 mg/dL — ABNORMAL HIGH (ref 65–99)

## 2017-04-04 MED ORDER — INSULIN ASPART 100 UNIT/ML ~~LOC~~ SOLN
0.0000 [IU] | Freq: Three times a day (TID) | SUBCUTANEOUS | Status: DC
  Administered 2017-04-04: 11 [IU] via SUBCUTANEOUS
  Administered 2017-04-05: 5 [IU] via SUBCUTANEOUS
  Administered 2017-04-05: 11 [IU] via SUBCUTANEOUS
  Administered 2017-04-05: 5 [IU] via SUBCUTANEOUS
  Administered 2017-04-06: 3 [IU] via SUBCUTANEOUS
  Administered 2017-04-06 (×2): 2 [IU] via SUBCUTANEOUS
  Administered 2017-04-07 (×2): 5 [IU] via SUBCUTANEOUS
  Administered 2017-04-07: 3 [IU] via SUBCUTANEOUS
  Administered 2017-04-08: 11 [IU] via SUBCUTANEOUS
  Administered 2017-04-08: 2 [IU] via SUBCUTANEOUS
  Administered 2017-04-08: 3 [IU] via SUBCUTANEOUS
  Filled 2017-04-04 (×13): qty 1

## 2017-04-04 MED ORDER — FUROSEMIDE 20 MG PO TABS
20.0000 mg | ORAL_TABLET | Freq: Every day | ORAL | Status: DC
  Administered 2017-04-05: 20 mg via ORAL
  Filled 2017-04-04: qty 1

## 2017-04-04 NOTE — Progress Notes (Signed)
[place] at [place] NAME: Samantha Aguirre    MR#:  [id number]  DATE OF BIRTH:  Jan 06, 1946  SUBJECTIVE:  CHIEF COMPLAINT:   Chief Complaint  Patient presents with  . Influenza   The patient was hypoxia last night, put on oxygen 2 L by nasal counter.  She feels better this morning, but still has wheezing and cough. REVIEW OF SYSTEMS:  Review of Systems  Constitutional: Negative for chills, fever and malaise/fatigue.  HENT: Negative for sore throat.   Eyes: Negative for blurred vision and double vision.  Respiratory: Positive for cough, shortness of breath and wheezing. Negative for hemoptysis and stridor.   Cardiovascular: Negative for chest pain, palpitations, orthopnea and leg swelling.  Gastrointestinal: Negative for abdominal pain, blood in stool, diarrhea, melena, nausea and vomiting.  Genitourinary: Negative for dysuria, flank pain and hematuria.  Musculoskeletal: Negative for back pain and joint pain.  Skin: Negative for rash.  Neurological: Negative for dizziness, sensory change, focal weakness, seizures, loss of consciousness, weakness and headaches.  Endo/Heme/Allergies: Negative for polydipsia.  Psychiatric/Behavioral: Negative for depression. The patient is not nervous/anxious.     DRUG ALLERGIES:   Allergies  Allergen Reactions  . Mushroom Extract Complex Anaphylaxis    Made patient "deathly sick"  . Atenolol     "MD took me off of it bc it was doing something wrong" Made patient HYPOTENSIVE  . Ivp Dye [Iodinated Diagnostic Agents] Itching    Pt injected with IV contrast.  5 min after injection pt complained of itching behind ear and on her abd.  . Morphine And Related Other (See Comments)    "stops my breathing" NEAR-RESPIRATORY ARREST  . Percocet [Oxycodone-Acetaminophen] Nausea And Vomiting and Other (See Comments)    Stomach pains  . Percodan [Oxycodone-Aspirin] Nausea And Vomiting and Other (See Comments)    Stomach pains  . Verapamil Other (See Comments)    " MD told me this was screwing me up" MAKES PATIENT HYPOTENSIVE   VITALS:  Blood pressure 124/60, pulse 65, temperature 98.4 F (36.9 C), temperature source Oral, resp. rate (!) 22, height 5\' 1"  (1.549 m), weight 178 lb 12.8 oz (81.1 kg), SpO2 93 %. PHYSICAL EXAMINATION:  Physical Exam  Constitutional: She is oriented to person, place, and time and well-developed, well-nourished, and in no distress.  HENT:  Head: Normocephalic.  Mouth/Throat: Oropharynx is clear and moist.  Eyes: Pupils are equal, round, and reactive to light. Conjunctivae and EOM are normal. No scleral icterus.  Neck: Normal range of motion. Neck supple. No JVD present. No tracheal deviation present.  Cardiovascular: Normal rate, regular rhythm and normal heart sounds. Exam reveals no gallop.  No murmur heard. Pulmonary/Chest: Effort normal. No respiratory distress. She has wheezes. She has rhonchi. She has no rales.  Abdominal: Soft. Bowel sounds are normal. She exhibits no distension. There is no tenderness. There is no rebound.  Musculoskeletal: Normal range of motion. She exhibits no edema or tenderness.  Neurological: She is alert and oriented to person, place, and time. No cranial nerve deficit.  Skin: No rash noted. No erythema.  Psychiatric: Affect normal.   LABORATORY PANEL:  Female CBC Recent Labs  Lab 04/04/17 0544  WBC 15.2*  HGB 11.7*  HCT 36.6  PLT 267   ------------------------------------------------------------------------------------------------------------------ Chemistries  Recent Labs  Lab 04/03/17 1612 04/04/17 0544  NA  --  140  K  --  4.6  CL  --  105  CO2  --  25  GLUCOSE  --  285*  BUN  --  17  CREATININE  --  0.67  CALCIUM  --  8.9  MG 1.9  --    RADIOLOGY:  Dg Chest Portable 1 View  Result Date: 04/03/2017 CLINICAL DATA:  Shortness of breath and wheezing EXAM: PORTABLE CHEST 1 VIEW COMPARISON:  03/27/2017, 02/18/2017  FINDINGS: The heart size and mediastinal contours are within normal limits. Both lungs are clear. Aortic atherosclerosis. The visualized skeletal structures are unremarkable. IMPRESSION: No active disease. Electronically Signed   By: Donavan Foil M.D.   On: 04/03/2017 16:36   ASSESSMENT AND PLAN:   Acute respiratory failure with hypoxia due to COPD exacerbation. Continue IV Solu-Medrol, DuoNeb every 6 hours and home nebulizer. Try to wean off oxygen.  Chronic diastolic CHF, LV EF: 50% -   65%.  Stable, continue Lasix. Mild leukocytosis, due to steroid. Chronic A. fib.  Continue Eliquis. Hypertension.  Continue home hypertension medication. Diabetes.  Started sliding scale. Tobacco abuse.  Smoking cessation was counseled for 4 minutes.  All the records are reviewed and case discussed with Care Management/Social Worker. Management plans discussed with the patient, family and they are in agreement.  CODE STATUS: DNR  TOTAL TIME TAKING CARE OF THIS PATIENT: 28 minutes.   More than 50% of the time was spent in counseling/coordination of care: YES  POSSIBLE D/C IN 2 DAYS, DEPENDING ON CLINICAL CONDITION.   Demetrios Loll M.D on 04/04/2017 at 3:27 PM  Between 7am to 6pm - Pager - [phone]  After 6pm go to [email or web address] - [place] [facility]

## 2017-04-04 NOTE — Plan of Care (Signed)
Patient was able to teach back about her medications to show understanding.

## 2017-04-05 ENCOUNTER — Inpatient Hospital Stay: Payer: Medicare Other

## 2017-04-05 LAB — GLUCOSE, CAPILLARY
GLUCOSE-CAPILLARY: 174 mg/dL — AB (ref 65–99)
Glucose-Capillary: 247 mg/dL — ABNORMAL HIGH (ref 65–99)
Glucose-Capillary: 306 mg/dL — ABNORMAL HIGH (ref 65–99)
Glucose-Capillary: 225 mg/dL — ABNORMAL HIGH (ref 65–99)
Glucose-Capillary: 170 mg/dL — ABNORMAL HIGH (ref 65–99)

## 2017-04-05 MED ORDER — INSULIN GLARGINE 100 UNIT/ML ~~LOC~~ SOLN
10.0000 [IU] | Freq: Every day | SUBCUTANEOUS | Status: DC
  Administered 2017-04-05 – 2017-04-07 (×3): 10 [IU] via SUBCUTANEOUS
  Filled 2017-04-05 (×4): qty 0.1

## 2017-04-05 MED ORDER — PREDNISONE 50 MG PO TABS
50.0000 mg | ORAL_TABLET | Freq: Every day | ORAL | Status: DC
  Administered 2017-04-06 – 2017-04-08 (×3): 50 mg via ORAL
  Filled 2017-04-05 (×3): qty 1

## 2017-04-05 MED ORDER — METHYLPREDNISOLONE SODIUM SUCC 40 MG IJ SOLR: 40.0000 mg | Freq: Two times a day (BID) | INTRAMUSCULAR | Status: DC

## 2017-04-05 MED ORDER — LORAZEPAM 2 MG/ML IJ SOLN
INTRAMUSCULAR | Status: AC
  Administered 2017-04-05: 2 mg
  Filled 2017-04-05: qty 1

## 2017-04-05 MED ORDER — METHYLPREDNISOLONE SODIUM SUCC 40 MG IJ SOLR
40.0000 mg | Freq: Two times a day (BID) | INTRAMUSCULAR | Status: AC
  Administered 2017-04-05: 40 mg via INTRAVENOUS
  Filled 2017-04-05: qty 1

## 2017-04-05 MED ORDER — STROKE: EARLY STAGES OF RECOVERY BOOK: Freq: Once | Status: DC

## 2017-04-05 MED ORDER — MIDAZOLAM HCL 2 MG/2ML IJ SOLN: 2.0000 mg | Freq: Once | INTRAMUSCULAR | Status: DC

## 2017-04-05 NOTE — Evaluation (Signed)
Physical Therapy Evaluation Patient Details Name: Samantha Aguirre MRN: [id number] DOB: [birth year] Today's Date: 04/05/2017   History of Present Illness  Samantha Aguirre is a 72 y.o. female with a known history of diastolic heart failure, bronchial asthma, atrial fibrillation, COPD, hyperlipidemia, diabetes mellitus, seizure disorder, skin cancer admitted to the emergency room with increased shortness of breath, cough and congestion for the last few days.  Patient also has some chills, body aches and a low-grade fever flu test was positive in the emergency room.  Patient was put on oxygen by nasal cannula for low oxygen saturationand stabilized and given Tamiflu.  No complaints of any chest pain.  Hospitalist service was consulted for further care. Pt is now admitted for respiratory distress and a COPD exacerbation.  Clinical Impression  Pt is a 72 year old female who lives in an apartment alone.  She reports being independent with use of quad cane and being driven to grocery store.  Pt is in bed upon PT arrival and able to perform bed mobility with increased time and use of bed rail.  She is able to perform a STS transfer several times, demonstrating increased fatigue with each transfer.  PT provided supervision due to pt appearing unsteady on her feet and report of dizziness when standing from toilet.  Pt was is able to navigate RW around obstacles and perform safe transfers with min VC's from PT.  Pt ambulated 60 ft in room, appearing to be unsteady on her feet at times.  Pt's BP and O2 sats remained WNL throughout evaluation.  Pt reported N/T in her L side which has been present and unchanging since yesterday. PT performed coordination testing which revealed a more pronounced tremor and slower time in L UE but no significant strength deficits.  Pt will continue to benefit from skilled PT with focus on balance, tolerance to activity, safe management of AD and functional mobility.    Follow Up  Recommendations Home health PT(Pt may also benefit from [facility] program.)    Clinical biochemist with 5" wheels(Pt will need a pediatric rolling walker due to height.)    Recommendations for Other Services       Precautions / Restrictions Precautions Precautions: Fall Restrictions Weight Bearing Restrictions: No      Mobility  Bed Mobility Overal bed mobility: Modified Independent             General bed mobility comments: Required increased time but was able to scoot to EOB independently.  Transfers Overall transfer level: Needs assistance Equipment used: Rolling walker (2 wheeled) Transfers: Sit to/from Stand Sit to Stand: Supervision         General transfer comment: Pt able to perform STS without physical assist but did experience an episode of dizziness when standing from toilet.  Pt's BP measured WNL.  Pt performed 4 total STS's and presented with progressing weakness each time.  Ambulation/Gait Ambulation/Gait assistance: Supervision Ambulation Distance (Feet): 60 Feet Assistive device: Rolling walker (2 wheeled)     Gait velocity interpretation: Below normal speed for age/gender General Gait Details: Pt able to navigate RW in room or furniture cruise for stability.  She presents with moderate foot clearance and appears to be unsteady on her feet at times.  Stairs            Wheelchair Mobility    Modified Rankin (Stroke Patients Only)       Balance Overall balance assessment: Needs assistance Sitting-balance support: No upper extremity supported  Sitting balance-Leahy Scale: Good     Standing balance support: Single extremity supported Standing balance-Leahy Scale: Fair                               Pertinent Vitals/Pain Pain Assessment: No/denies pain    Home Living Family/patient expects to be discharged to:: Private residence Living Arrangements: Alone Available Help at Discharge:  Friend(s);Available 24 hours/day Type of Home: Apartment Home Access: Level entry     Home Layout: One level Home Equipment: Walker - 2 wheels;Tub bench;Cane - quad      Prior Function Level of Independence: Independent with assistive device(s)   Gait / Transfers Assistance Needed: Ambulates with quad cane for distance  ADL's / Homemaking Assistance Needed: Has someone to drive her to pay her bills and get groceries.        Hand Dominance   Dominant Hand: Right    Extremity/Trunk Assessment   Upper Extremity Assessment Upper Extremity Assessment: Overall WFL for tasks assessed(RUE weakness compared to LUE.)    Lower Extremity Assessment Lower Extremity Assessment: Overall WFL for tasks assessed    Cervical / Trunk Assessment Cervical / Trunk Assessment: Kyphotic  Communication   Communication: No difficulties  Cognition                                              General Comments General comments (skin integrity, edema, etc.): Coordination testing: Finger to nose: L: 7 sec, R: 5 sec with visible tremor bilaterally but more pronounced on L side.    Exercises     Assessment/Plan    PT Assessment Patient needs continued PT services  PT Problem List Decreased strength;Decreased mobility;Decreased safety awareness;Decreased coordination;Decreased activity tolerance;Decreased balance;Decreased knowledge of use of DME;Impaired sensation;Decreased cognition;Cardiopulmonary status limiting activity       PT Treatment Interventions DME instruction;Functional mobility training;Balance training;Patient/family education;Gait training;Therapeutic activities;Neuromuscular re-education;Stair training;Therapeutic exercise;Cognitive remediation    PT Goals (Current goals can be found in the Care Plan section)  Acute Rehab PT Goals Patient Stated Goal: Return to prior level of function at home PT Goal Formulation: With patient Time For Goal Achievement:  04/11/17 Potential to Achieve Goals: Good    Frequency Min 2X/week   Barriers to discharge        Co-evaluation               AM-PAC PT "6 Clicks" Daily Activity  Outcome Measure Difficulty turning over in bed (including adjusting bedclothes, sheets and blankets)?: A Little Difficulty moving from lying on back to sitting on the side of the bed? : A Little Difficulty sitting down on and standing up from a chair with arms (e.g., wheelchair, bedside commode, etc,.)?: A Little Help needed moving to and from a bed to chair (including a wheelchair)?: A Little Help needed walking in hospital room?: A Little Help needed climbing 3-5 steps with a railing? : A Little 6 Click Score: 18    End of Session Equipment Utilized During Treatment: Gait belt Activity Tolerance: Patient limited by fatigue Patient left: in chair;with chair alarm set;with call bell/phone within reach Nurse Communication: Mobility status(Pt states that she has experienced N/T in L side which has not changed since yesterday.) PT Visit Diagnosis: Unsteadiness on feet (R26.81);History of falling (Z91.81);Dizziness and giddiness (R42);Muscle weakness (generalized) (M62.81)    Time:  1630-1700 PT Time Calculation (min) (ACUTE ONLY): 30 min   Charges:   PT Evaluation $PT Eval Low Complexity: 1 Low PT Treatments $Therapeutic Activity: 8-22 mins   PT G Codes:   PT G-Codes **NOT FOR INPATIENT CLASS** Functional Assessment Tool Used: AM-PAC 6 Clicks Basic Mobility    Roxanne Gates, PT, DPT   Roxanne Gates 04/05/2017, 5:18 PM

## 2017-04-05 NOTE — Progress Notes (Signed)
   [facility] TeleNeurology Consult Services  Impression:  Acute Stroke right subcortical infarct likely small vessel disease vs reactivation of prior CVA in setting of flu presentation   rf of prior CVA, HTN, Afib   Not a tpa candidate due to: s/p eliquis dose this am,  Symptoms not consistent with LVO therefore no need for emergent imaging CTA H/N/P    Differential Diagnosis:   1. Cardioembolic stroke  2. Small vessel disease/lacune  3. Thromboembolic, artery-to-artery mechanism  4. Hypercoagulable state-related infarct  5. Transient ischemic attack  6. Thrombotic mechanism, large artery disease   Comments:   [facility] contacted: 17:49 [facility] at bedside: 17:56 NIHSS assessment time: 18:10  Recommendations:  -MRI brain  -CUS -2d echo -neuro checks -NPO until bedside swallowing eval performed  Inpatient neurology consultation Inpatient stroke evaluation as per Neurology/ Internal Medicine Discussed with ED MD Please call with questions  -----------------------------------------------------------------------------------------  CC dysarthria, left side numbness/weakness.   History of Present Illness   Patient is a  72 yr old with PMH of seizures, CVA, Afib, CHF, tremors, headach who is admitted for flu and COPD exacerbation. Today at 17hr noted by nurse to have the left sided weakness, worsening facial droop and slurred speech. She denies any headache, visual , dizziness.  Has residual left sided weakness since last stroke last year as well as numbness. She did not have any speech deficits.  Last eliquis dose at 11am.   Diagnostic: CT brain: aspects of 10, no acute findings.   Exam: 1a- LOC: Keenly responsive - 0   1b- LOC questions: Answers both questions correctly - 0     1c- LOC commands- Performs both tasks correctly- 0     2- Gaze: Normal; no gaze paresis or gaze deviation - 0     3- Visual Fields: normal, no Visual field deficit - 0       4- Facial movements: very slight depressed left nasolabial fold - 1     5- Upper limb motor - left drift -1     6- Lower limb motor - left leg drift - 2      7- Limb Coordination: absent ataxia - 0      8- Sensory : left  sensory loss - 1     9- Language - No aphasia - 0      10- Speech - positive dysarthria -1     11- Neglect / Extinction - none found -0     NIHSS score   6    Medical Decision Making:  - Extensive number of diagnosis or management options are considered above.   - Extensive amount of complex data reviewed.   - High risk of complication and/or morbidity or mortality are associated with differential diagnostic considerations above.  - There may be Uncertain outcome and increased probability of prolonged functional impairment or high probability of severe prolonged functional impairment associated with some of these differential diagnosis.  Medical Data Reviewed:  1.Data reviewed include clinical labs, radiology,  Medical Tests;   2.Tests results discussed w/performing or interpreting physician;   3.Obtaining/reviewing old medical records;  4.Obtaining case history from another source;  5.Independent review of image, tracing or specimen.    Patient was informed the Neurology Consult would happen via telehealth (remote video) and consented to receiving care in this manner.

## 2017-04-05 NOTE — Progress Notes (Signed)
Advanced Care Plan.  Purpose of Encounter: Palliative care. Parties in Attendance: The patient, her daughter and me. Patient's Decisional Capacity: Yes. Medical Story: Samantha Aguirre  is a 72 y.o. female with a known history of multiple medical problems including A. fib on Eliquis, CHF, COPD, CAD, hypertension, diabetes, hyperlipidemia, and CVA.  The patient is admitted for COPD exacerbation.  She developed a facial droop and left-sided weakness during physical therapy evaluation.  Per her daughter, the patient has no health power of attorney. She is next of kin.  I discussed with the patient and her daughter about palliative care.  They agree to get palliative care consult.  Goals of Care Determinations: Palliative care consult. Plan:  Code Status: DNR. Time spent discussing advance care planning: 20 minutes.

## 2017-04-05 NOTE — Progress Notes (Signed)
Patient was sleeping without oxygen.  I returned later, and she was wearing it.  She reported she could not find it previously.  Patient reports that she needs the oxygen and not to wean her. Phillis Knack, RN

## 2017-04-05 NOTE — Significant Event (Signed)
Rapid Response Event Note  Overview: Time Called: 6256 Arrival Time: 1716 Event Type: Neurologic  Initial Focused Assessment: called to rapid response for pt with possible cva symptoms.   Interventions: Pt sitting in chaire with left facial droop and left side weakness. VSS. Moved back to bed. Dr Bridgett Larsson to bedside to examine pt. Ordered stat head CT and told pts nurse he would contact Dr Irish Elders. Pt to CT accompanied my myself and primary nurse shylon. Dr Bridgett Larsson wanted pt to go back to original room, and for neuro-tele monitor to be in room.  Plan of Care (if not transferred): [facility] and charge RN Janett Billow to call back if further assistance.   Event Summary: Name of Physician Notified: Bridgett Larsson at 1718  Name of Consulting Physician Notified: Bridgett Larsson spoke with Irish Elders at 1725  Outcome: Stayed in room and stabalized  Event End Time: [place]

## 2017-04-05 NOTE — Progress Notes (Addendum)
The patient was noticed left facial droop and left-sided weakness by physical therapist and RN during physical therapy.  I examined the patient at bedside, the patient has slurred speech, mild left side facial droop and left-sided weakness. Impression; Acute CVA.  History of multiple strokes. Stat CAT scan of the head.  The patient is already on Eliquis for chronic A. fib.  I discussed that with Dr. Irish Elders, who suggest telemetry neurology consult.  The patient was sent to CAT scan just now. Follow-up CAT scan of the head: Posterior left MCA territory subacute to chronic infarct new since 10/20/2016.  Tele neurologist is talking to the patient. Telemetry neurology's recommendation as below: Not a tpa candidate due to: s/p eliquis dose this am -MRI brain  -CUS -2d echo -neuro checks -NPO until bedside swallowing eval performed Inpatient neurology consultation  I discussed these rapid response nurse and neurologist Dr. Irish Elders, discussed with the patient's daughter.  Time spent about 46 minutes.

## 2017-04-05 NOTE — Progress Notes (Addendum)
[facility] [facility] [place] NAME: Anner Baity    MR#:  [id number]  DATE OF BIRTH:  March 04, 1945  SUBJECTIVE:  CHIEF COMPLAINT:   Chief Complaint  Patient presents with  . Influenza   The patient was on oxygen 2 L [place] this morning, still has wheezing and cough. Off O2  afternoon. REVIEW OF SYSTEMS:  Review of Systems  Constitutional: Positive for malaise/fatigue. Negative for chills and fever.  HENT: Negative for sore throat.   Eyes: Negative for blurred vision and double vision.  Respiratory: Positive for cough, shortness of breath and wheezing. Negative for hemoptysis and stridor.   Cardiovascular: Negative for chest pain, palpitations, orthopnea and leg swelling.  Gastrointestinal: Negative for abdominal pain, blood in stool, diarrhea, melena, nausea and vomiting.  Genitourinary: Negative for dysuria, flank pain and hematuria.  Musculoskeletal: Negative for back pain and joint pain.  Skin: Negative for rash.  Neurological: Negative for dizziness, sensory change, focal weakness, seizures, loss of consciousness, weakness and headaches.  Endo/Heme/Allergies: Negative for polydipsia.  Psychiatric/Behavioral: Negative for depression. The patient is not nervous/anxious.     DRUG ALLERGIES:   Allergies  Allergen Reactions  . Mushroom Extract Complex Anaphylaxis    Made patient "deathly sick"  . Atenolol     "MD took me off of it bc it was doing something wrong" Made patient HYPOTENSIVE  . Ivp Dye [Iodinated Diagnostic Agents] Itching    Pt injected with IV contrast.  5 min after injection pt complained of itching behind ear and on her abd.  . Morphine And Related Other (See Comments)    "stops my breathing" NEAR-RESPIRATORY ARREST  . Percocet [Oxycodone-Acetaminophen] Nausea And Vomiting and Other (See Comments)    Stomach pains  . Percodan [Oxycodone-Aspirin] Nausea And Vomiting and Other (See Comments)    Stomach pains  .  Verapamil Other (See Comments)    " MD told me this was screwing me up" MAKES PATIENT HYPOTENSIVE   VITALS:  Blood pressure (!) 125/46, pulse 67, temperature (!) 97.5 F (36.4 C), temperature source Oral, resp. rate 19, height 5\' 1"  (1.549 m), weight 177 lb 12.8 oz (80.6 kg), SpO2 95 %. PHYSICAL EXAMINATION:  Physical Exam  Constitutional: She is oriented to person, place, and time and well-developed, well-nourished, and in no distress.  HENT:  Head: Normocephalic.  Mouth/Throat: Oropharynx is clear and moist.  Eyes: Pupils are equal, round, and reactive to light. Conjunctivae and EOM are normal. No scleral icterus.  Neck: Normal range of motion. Neck supple. No JVD present. No tracheal deviation present.  Cardiovascular: Normal rate, regular rhythm and normal heart sounds. Exam reveals no gallop.  No murmur heard. Pulmonary/Chest: Effort normal. No respiratory distress. She has wheezes. She has no rhonchi. She has no rales.  Abdominal: Soft. Bowel sounds are normal. She exhibits no distension. There is no tenderness. There is no rebound.  Musculoskeletal: Normal range of motion. She exhibits no edema or tenderness.  Neurological: She is alert and oriented to person, place, and time. No cranial nerve deficit.  Skin: No rash noted. No erythema.  Psychiatric: Affect normal.   LABORATORY PANEL:  Female CBC Recent Labs  Lab 04/04/17 0544  WBC 15.2*  HGB 11.7*  HCT 36.6  PLT 267   ------------------------------------------------------------------------------------------------------------------ Chemistries  Recent Labs  Lab 04/03/17 1612 04/04/17 0544  NA  --  140  K  --  4.6  CL  --  105  CO2  --  25  GLUCOSE  --  285*  BUN  --  17  CREATININE  --  0.67  CALCIUM  --  8.9  MG 1.9  --    RADIOLOGY:  No results found. ASSESSMENT AND PLAN:   Acute respiratory failure with hypoxia due to COPD exacerbation. taper IV Solu-Medrol and change to prednisone, albuterol as needed  and home nebulizer. Weaned off oxygen.  Chronic diastolic CHF, LV EF: 23% -   65%.  Stable, continue Lasix. Mild leukocytosis, due to steroid. Chronic A. fib.  Continue Eliquis. Hypertension.  Continue home hypertension medication. Hyperglycemia with diabetes.  Due to steroid. Started Lantus and continue sliding scale. Tobacco abuse.  Smoking cessation was counseled for 4 minutes. History of CVA.  On Eliquis.  All the records are reviewed and case discussed with Care Management/Social Worker. Management plans discussed with the patient, family and they are in agreement.  CODE STATUS: DNR  TOTAL TIME TAKING CARE OF THIS PATIENT: 32 minutes.   More than 50% of the time was spent in counseling/coordination of care: YES  POSSIBLE D/C IN 2 DAYS, DEPENDING ON CLINICAL CONDITION.   Demetrios Loll M.D on 04/05/2017 at 4:33 PM  Between 7am to 6pm - Pager - [phone]  After 6pm go to [email or web address] - [facility]

## 2017-04-06 ENCOUNTER — Inpatient Hospital Stay: Payer: Medicare Other | Admitting: Family Medicine

## 2017-04-06 ENCOUNTER — Inpatient Hospital Stay: Payer: Medicare Other

## 2017-04-06 ENCOUNTER — Inpatient Hospital Stay
Admit: 2017-04-06 | Discharge: 2017-04-06 | Disposition: A | Discharge status: Home / Self Care | Payer: Medicare Other | Attending: Internal Medicine | Admitting: Internal Medicine

## 2017-04-06 DIAGNOSIS — I639 Cerebral infarction, unspecified: Secondary | ICD-10-CM

## 2017-04-06 LAB — GLUCOSE, CAPILLARY
GLUCOSE-CAPILLARY: 182 mg/dL — AB (ref 65–99)
GLUCOSE-CAPILLARY: 141 mg/dL — AB (ref 65–99)
Glucose-Capillary: 144 mg/dL — ABNORMAL HIGH (ref 65–99)
Glucose-Capillary: 158 mg/dL — ABNORMAL HIGH (ref 65–99)

## 2017-04-06 LAB — LIPID PANEL
Cholesterol: 132 mg/dL (ref 0–200)
HDL: 51 mg/dL (ref >40)
LDL CALC: 63 mg/dL (ref 0–99)
Total CHOL/HDL Ratio: 2.6 RATIO
Triglycerides: 91 mg/dL (ref <150)
VLDL: 18 mg/dL (ref 0–40)

## 2017-04-06 LAB — ECHOCARDIOGRAM COMPLETE
HEIGHTINCHES: 61 in
WEIGHTICAEL: 2836.8 [oz_av]

## 2017-04-06 LAB — HEMOGLOBIN A1C
HEMOGLOBIN A1C: 6.3 % — AB (ref 4.8–5.6)
Mean Plasma Glucose: 134.11 mg/dL

## 2017-04-06 MED ORDER — LORAZEPAM 1 MG PO TABS
1.0000 mg | ORAL_TABLET | Freq: Once | ORAL | Status: AC
  Administered 2017-04-06: 1 mg via ORAL
  Filled 2017-04-06: qty 1

## 2017-04-06 MED ORDER — DOCUSATE SODIUM 100 MG PO CAPS
100.0000 mg | ORAL_CAPSULE | Freq: Two times a day (BID) | ORAL | Status: DC
  Administered 2017-04-06 – 2017-04-07 (×4): 100 mg via ORAL
  Filled 2017-04-06 (×5): qty 1

## 2017-04-06 MED ORDER — BISACODYL 10 MG RE SUPP
10.0000 mg | Freq: Every day | RECTAL | Status: DC | PRN
  Administered 2017-04-06: 10 mg via RECTAL
  Filled 2017-04-06: qty 1

## 2017-04-06 NOTE — Progress Notes (Signed)
MD made aware of MRI and CT results. New orders for stoke scale implemented. Pt initial assessments shows obvious deficits in the left side. Pt can identify pictures but ha difficulty reading the words and sentences on the handout during the initial assessment. Pt is slightly disoriented and answers inappropriately when asked simple questions. Will continue to monitor.

## 2017-04-06 NOTE — Evaluation (Signed)
Clinical/Bedside Swallow Evaluation Patient Details  Name: Samantha Aguirre MRN: [id number] Date of Birth: [birth year]  Today's Date: 04/06/2017 Time: SLP Start Time (ACUTE ONLY): 79 SLP Stop Time (ACUTE ONLY): 1333 SLP Time Calculation (min) (ACUTE ONLY): 53 min  Past Medical History:  Past Medical History:  Diagnosis Date  . Acid reflux   . Arthritis    Bilateral knees, hands, feet  . Asthma   . Atrial fibrillation ([place])   . Cancer ([place])   . Chest pain   . CHF (congestive heart failure) (HCC)    Diastolic CHF  . COPD (chronic obstructive pulmonary disease) ([place])   . Coronary artery disease   . Diabetes mellitus without complication ([place])   . Frequent headaches   . Heart attack ([place])   . High cholesterol   . HLD (hyperlipidemia)   . Hypertension   . MI (myocardial infarction) ([place])   . Seizures ([place])   . Skin cancer 2015   Suspected basal cell carcinoma on nose, surgically removed  . Stroke ([place])   . Thyroid disease   . Tremors of nervous system    Past Surgical History:  Past Surgical History:  Procedure Laterality Date  . ABDOMINAL HYSTERECTOMY  1976  . APPENDECTOMY  1980  . CAROTID ENDARTERECTOMY    . CHOLECYSTECTOMY  1980  . COLONOSCOPY N/A 02/20/2017   Procedure: COLONOSCOPY;  Surgeon: Lin Landsman, MD;  [facility];  Service: Gastroenterology;  Laterality: N/A;  . ESOPHAGOGASTRODUODENOSCOPY N/A 02/20/2017   Procedure: ESOPHAGOGASTRODUODENOSCOPY (EGD);  Surgeon: Lin Landsman, MD;  Location: [facility] ENDOSCOPY;  Service: Gastroenterology;  Laterality: N/A;  . [place]  . KNEE SURGERY     left  . ROTATOR CUFF REPAIR     right  . TONSILLECTOMY  1950   HPI: Per admission and History:    Samantha Aguirre  is a 72 y.o. female with a known history of multiple medical problems as below.  Patient was just discharged to home after treatment of COPD exacerbation yesterday.  She feels worsening shortness of breath since this  morning.  She used nebulizer without improvement.  She came to the ED and found wheezing, shortness of breath, tachycardia and tachypnea due to COPD exacerbation.  She is treated with IV Solu-Medrol and nebulizer with little improvement.    04/05/17, Pt presented with left side facial droop since admission and is currently receivng a stroke workup to include bedside swallow eval today. Assessment / Plan / Recommendation Clinical Impression  Pt presents with functional swallowing at bedside with no immediate overt s/s of aspiration. Not was trying to get out of bed when ST entered and seemed to have difficulty using the callbell to call Nsg for help. ST assist Pt although she seemed somewhat confused. Oral mech exam revealed structures to be functioning adequately with no apparent unilateral weakness. Per previus notes, Pt was noted to have new left sided facial droop since admission and is currently being worked up for CVA. Pt tolerated all consisentlies wihout s/s of aspiration, vocal quality remained clear. Pt was noted to have upper airway rattling when breathing before any PO intake which improves after she coughs hard. oral transit delay with solids although Pt was able to clear oral cavity adequately. Rec Dys 3 diet for now, so that Pt does not receive very hard foods. ST to f/u with toleration of diet. Pt noted to have some difficullty retrieving words at the sentence and conversation level. Also noted dicciculty naming objects. Pt  will need a full speech and language eval. for Aphasia this doesn't spontantaniously improve. ST to f/u tomorrow. SLP Visit Diagnosis: Dysphagia, oropharyngeal phase (R13.12)    Aspiration Risk  Mild aspiration risk    Diet Recommendation Dysphagia 3 (Mech soft)   Supervision: Patient able to self feed;Intermittent supervision to cue for compensatory strategies Compensations: Minimize environmental distractions;Small sips/bites;Slow rate Postural Changes: Seated  upright at 90 degrees    Other  Recommendations     Follow up Recommendations Other (comment)(Will likely need ST at discharge for language deficits)      Frequency and Duration min 2x/week  1 week       Prognosis Prognosis for Safe Diet Advancement: Good      Swallow Study   General Date of Onset: 04/05/17 Type of Study: Bedside Swallow Evaluation Diet Prior to this Study: NPO Temperature Spikes Noted: No Respiratory Status: Room air History of Recent Intubation: No Behavior/Cognition: Alert;Confused Oral Cavity Assessment: Within Functional Limits Oral Cavity - Dentition: Dentures, top Vision: Functional for self-feeding Self-Feeding Abilities: Able to feed self;Needs set up Patient Positioning: Upright in chair Baseline Vocal Quality: Normal;Hoarse Volitional Cough: Strong Volitional Swallow: Able to elicit    Oral/Motor/Sensory Function Overall Oral Motor/Sensory Function: Within functional limits   Ice Chips Ice chips: Not tested   Thin Liquid Thin Liquid: Within functional limits Presentation: Cup;Straw    Nectar Thick Nectar Thick Liquid: Not tested   Honey Thick     Puree Puree: Within functional limits Presentation: Self Fed;Spoon   Solid   GO   Solid: Within functional limits Presentation: Self Fed        Samantha Aguirre 04/06/2017,1:35 PM

## 2017-04-06 NOTE — Plan of Care (Signed)
LLE weakness continues and without change this shift.

## 2017-04-06 NOTE — Evaluation (Signed)
Physical Therapy Evaluation Patient Details Name: Samantha Aguirre MRN: [id number] DOB: 22-Aug-1945 Today's Date: 04/06/2017   History of Present Illness  Pt is a 72 y.o. female with a known history of diastolic heart failure, bronchial asthma, atrial fibrillation, COPD, hyperlipidemia, diabetes mellitus, seizure disorder, skin cancer admitted to the emergency room with increased shortness of breath, cough and congestion. Patient also had some chills, body aches and a low-grade fever with flu test was positive in the emergency room. Patient was put on oxygen by nasal cannula for low oxygen saturationand stabilized and given Tamiflu.  While in acute care on 04/05/17 staff noticed L-sided weakness with neurology noting pt to be positive for acute R thalamic infarct and subacute L posterior temporal infarct.  Assessment also includes: acute respiratory failure with hypoxia due to COPD exacerbation, chronic CHF, chronic A-fib, and HTN.      Clinical Impression  Pt presents with deficits in strength, transfers, mobility, gait, balance, and activity tolerance.  Pt with noted deficits to LLE strength compared to RLE and to LUE sequential finger to thumb opposition and finger to nose tests compared to RUE.  Pt also presented with instability in standing requiring min A to prevent L lateral LOB during amb.  Pt reported feeling as if LLE was going to buckle on her during amb with a w/c follow with pt returned to sitting for safety.  Overall pt is at a high risk for falls and would not be safe to return to her prior living situation at this time.  Pt will benefit from PT services in a SNF setting upon discharge to safely address above deficits for decreased caregiver assistance and eventual return to PLOF.        Follow Up Recommendations SNF    Equipment Recommendations  Rolling walker with 5" wheels;Other (comment)(TBD at next venue of care if discharges to a SNF)    Recommendations for Other Services        Precautions / Restrictions Precautions Precautions: Fall Restrictions Weight Bearing Restrictions: No      Mobility  Bed Mobility Overal bed mobility: Modified Independent Bed Mobility: Supine to Sit;Sit to Supine     Supine to sit: Modified independent (Device/Increase time) Sit to supine: Modified independent (Device/Increase time)   General bed mobility comments: Extra time and effort during sup to sit but no physical assistance required  Transfers Overall transfer level: Needs assistance Equipment used: Rolling walker (2 wheeled) Transfers: Sit to/from Stand Sit to Stand: Min assist;Min guard Stand pivot transfers: Min guard       General transfer comment: Pt required close CGA with sit to/from stand transfers from elevated EOB and Min A with sit to stand transfers from w/c  Ambulation/Gait Ambulation/Gait assistance: Min assist;+2 safety/equipment(w/c follow) Ambulation Distance (Feet): 80 Feet Assistive device: Rolling walker (2 wheeled) Gait Pattern/deviations: Step-through pattern;Decreased step length - right;Decreased step length - left;Trunk flexed   Gait velocity interpretation: <1.8 ft/sec, indicative of risk for recurrent falls General Gait Details: Pt required Min A during amb secondary to L lateral LOB on one occasion and otherwise was CGA.  Pt began to fatigue at the end of amb and reported feeling as if her L leg was buckling with pt returned to sitting in w/c that was following.  Stairs            Wheelchair Mobility    Modified Rankin (Stroke Patients Only)       Balance Overall balance assessment: Needs assistance Sitting-balance support: No upper extremity  supported;Feet supported Sitting balance-Leahy Scale: Good     Standing balance support: Bilateral upper extremity supported;During functional activity Standing balance-Leahy Scale: Poor Standing balance comment: L lateral instability in standing                              Pertinent Vitals/Pain Pain Assessment: 0-10 Pain Score: 6  Pain Location: Back and neck Pain Descriptors / Indicators: Aching;Sore Pain Intervention(s): Monitored during session(Pt reports does not want any pain medication from nursing)    [place] expects to be discharged to:: Private residence Living Arrangements: Alone Available Help at Discharge: Friend(s);Available 24 hours/day Type of Home: Apartment Home Access: Level entry     Home Layout: One level Home Equipment: Walker - 2 wheels;Tub bench;Cane - quad Additional Comments: No using power chair due to 2" step at egress of new apartment     Prior Function Level of Independence: Independent with assistive device(s)   Gait / Transfers Assistance Needed: Ambulates with quad cane for distance  ADL's / Homemaking Assistance Needed: Has someone to drive her to pay her bills and get groceries.  Comments: Pt endorses 4 falls in past 12 months due to "losing his balance"     Hand Dominance   Dominant Hand: Right    Extremity/Trunk Assessment   Upper Extremity Assessment Upper Extremity Assessment: Defer to OT evaluation RUE Deficits / Details: grossly 4+/5, intact sensation, difficulty with thumb opposition [place] [place] (question whether cognitive/processing versus true [facility] deficit) RUE Coordination: decreased fine motor LUE Deficits / Details: grossly 4/5, impaired sensation, + pronator drift, impaired finger to nose, RAM, and thumb opposition testing (question whether cognitive/processing versus true [facility] deficit) (pt with hx of previous CVA w/ L side weakness and impaired sensation) -- unsure if current presentation is different than baseline LUE Sensation: decreased light touch LUE Coordination: decreased fine motor    Lower Extremity Assessment Lower Extremity Assessment: Generalized weakness;LLE deficits/detail LLE Deficits / Details: RLE strength WFL; LLE hip flex <3/5, L knee flex 4/5, L knee  ext 3+/5, L ankle DF 3+/5 LLE Sensation: decreased light touch(Sensation to light touch intact but mildly decreased on the L; proprioception mildly decreased but equal R vs LLE)    Cervical / Trunk Assessment Cervical / Trunk Assessment: Kyphotic  Communication   Communication: Expressive difficulties  Cognition Arousal/Alertness: Awake/alert Behavior During Therapy: WFL for tasks assessed/performed Overall Cognitive Status: No family/caregiver present to determine baseline cognitive functioning                                 General Comments: Pt alert, oriented to self and somewhat to situation; follows simple 1 step commands and has difficulty with multi step commands, increased processing time      General Comments      Exercises Total Joint Exercises Ankle Circles/Pumps: AROM;Both;10 reps Quad Sets: Strengthening;Both;10 reps Gluteal Sets: Strengthening;Both;10 reps Hip ABduction/ADduction: AROM;AAROM;Both;10 reps;Other (comment)(AAROM on the LLE) Straight Leg Raises: AROM;AAROM;Both;10 reps;Other (comment)(AAROM on the LLE) Long Arc Quad: AROM;Both;10 reps Knee Flexion: AROM;Both;10 reps Marching in Standing: AROM;Both;10 reps;Seated Other Exercises Other Exercises: Pt instructed in hand placement and RW safety to maximize safety and minimize falls risk. Other Exercises: Pt participated in [facility] exercise requiring increasing downgrading to improve performance - requiring tactile, verbal, and 1 step simple commands to perform   Assessment/Plan    PT Assessment Patient needs continued PT services  PT Problem List Decreased strength;Decreased activity tolerance;Decreased balance;Decreased knowledge of use of DME;Decreased mobility       PT Treatment Interventions DME instruction;Gait training;Functional mobility training;Balance training;Therapeutic exercise;Therapeutic activities;Patient/family education;Neuromuscular re-education    PT Goals (Current goals  can be found in the Care Plan section)  Acute Rehab PT Goals Patient Stated Goal: To walk better without falling PT Goal Formulation: With patient Time For Goal Achievement: 04/19/17 Potential to Achieve Goals: Good    Frequency 7X/week   Barriers to discharge Decreased caregiver support Pt lives alone    Co-evaluation               AM-PAC PT "6 Clicks" Daily Activity  Outcome Measure Difficulty turning over in bed (including adjusting bedclothes, sheets and blankets)?: A Little Difficulty moving from lying on back to sitting on the side of the bed? : A Little Difficulty sitting down on and standing up from a chair with arms (e.g., wheelchair, bedside commode, etc,.)?: Unable Help needed moving to and from a bed to chair (including a wheelchair)?: A Little Help needed walking in hospital room?: A Lot Help needed climbing 3-5 steps with a railing? : A Lot 6 Click Score: 14    End of Session Equipment Utilized During Treatment: Gait belt Activity Tolerance: Patient limited by fatigue Patient left: in chair;with chair alarm set;with call bell/phone within reach Nurse Communication: Mobility status PT Visit Diagnosis: Unsteadiness on feet (R26.81);Muscle weakness (generalized) (M62.81);Difficulty in walking, not elsewhere classified (R26.2)    Time: 8366-2947 PT Time Calculation (min) (ACUTE ONLY): 31 min   Charges:   PT Evaluation $PT Eval Low Complexity: 1 Low PT Treatments $Therapeutic Exercise: 8-22 mins   PT G Codes:        DRoyetta Asal PT, DPT 04/06/17, 4:56 PM

## 2017-04-06 NOTE — Progress Notes (Signed)
Assisted patient to the bathroom.  Her balance is problematic.  She takes a few steps with problems and then starts to fall to the right or left.

## 2017-04-06 NOTE — Care Management (Signed)
Patient discharged from [facility] 3/21 after treatment for copd.  She was readmitted for same 3/22 and found to be A Flu then have acute cva within the last 24 hours.  Neuro, PT OT SLP consulting.Patient had referral to [facility] 3/21 when discharged for PT and OT.  Agency did not know of admission.  It is noted on the ED triage note that patient did not fill her scripts for her prednisone or inhaler.  Updated referral for RN PT Aide OT. Will provide patient with brochure for [facility].  It appears physical therapy may have precluded patient's acute cva and the recommendation may changed.  Occupation therapy is recommending SNF . Updated physical therapy

## 2017-04-06 NOTE — Evaluation (Signed)
Occupational Therapy Evaluation Patient Details Name: Samantha Aguirre MRN: [id number] DOB: [birth year] Today's Date: 04/06/2017    History of Present Illness Samantha Aguirre is a 72 y.o. female with a known history of diastolic heart failure, bronchial asthma, atrial fibrillation, COPD, hyperlipidemia, diabetes mellitus, seizure disorder, skin cancer admitted to the ED with increased SOB, cough and congestion for the last few days.  Patient also has some chills, body aches and a low-grade fever flu test was positive in the ED.  Patient was put on oxygen by nasal cannula for low oxygen saturationand stabilized and given Tamiflu.  No complaints of any chest pain.  Hospitalist service was consulted for further care. Pt admitted for respiratory distress and a COPD exacerbation.  Now +MRI for acute infarct (R lateral thalamus).   Clinical Impression   Pt seen for OT evaluation this date. Prior to hospital admission, pt was using a quad cane for mobility and generally independent with basic self care tasks and gets assist from a neighbor for transportation.  Pt lives alone in a level entry apartment.  Currently pt presents with L sided sensory and strength deficits, BUE coordination deficits, + LUE pronator drift, impaired balance, and impaired cognition. During assessment, pt often confused (oriented to self and somewhat to situation), and several times referred to herself in the 3rd person using female pronouns (e.g., while sitting on the [facility] stated, "He sometimes takes a while to use the bathroom."). Pt required min assist for bed mobility and min guard to min assist for SPT to [facility] with HHA. Pt with impaired safety awareness and insight into deficits as well. Pt would benefit from skilled OT to address noted impairments and functional limitations (see below for any additional details).  Upon hospital discharge, recommend pt discharge to SNF.    Follow Up Recommendations  SNF    Equipment Recommendations   Other (comment)(TBD)    Recommendations for Other Services       Precautions / Restrictions Precautions Precautions: Fall Restrictions Weight Bearing Restrictions: No      Mobility Bed Mobility Overal bed mobility: Needs Assistance Bed Mobility: Supine to Sit;Sit to Supine     Supine to sit: HOB elevated;Min assist Sit to supine: Modified independent (Device/Increase time)   General bed mobility comments: min assist to help pt in pulling herself to sit EOB  Transfers Overall transfer level: Needs assistance Equipment used: 1 person hand held assist Transfers: Sit to/from Stand;Stand Pivot Transfers Sit to Stand: Min guard Stand pivot transfers: Min guard            Balance Overall balance assessment: Needs assistance Sitting-balance support: No upper extremity supported;Feet supported Sitting balance-Leahy Scale: Good     Standing balance support: Single extremity supported Standing balance-Leahy Scale: Poor                             ADL either performed or assessed with clinical judgement   ADL Overall ADL's : Needs assistance/impaired Eating/Feeding: NPO   Grooming: Sitting;Supervision/safety   Upper Body Bathing: Sitting;Supervision/ safety   Lower Body Bathing: Sit to/from stand;Minimal assistance   Upper Body Dressing : Sitting;Supervision/safety   Lower Body Dressing: Sit to/from stand;Minimal assistance   Toilet Transfer: BSC;Stand-pivot;Min Psychiatric nurse Details (indicate cue type and reason): HHA x1 for SPT to [facility] with VC for safety/hand placement Toileting- Clothing Manipulation and Hygiene: Sit to/from stand;Min guard;Set up [place] Details (indicate cue type and reason): min  guard in transitional sit to stand and while pt was bent over to perform hygiene             Vision Baseline Vision/History: No visual deficits Patient Visual Report: No change from baseline Vision Assessment?: No  apparent visual deficits     Perception     Praxis      Pertinent Vitals/Pain Pain Assessment: No/denies pain     Hand Dominance Right   Extremity/Trunk Assessment Upper Extremity Assessment Upper Extremity Assessment: Generalized weakness;LUE deficits/detail;RUE deficits/detail RUE Deficits / Details: grossly 4+/5, intact sensation, difficulty with thumb opposition [place] (question whether cognitive/processing versus true [facility] deficit) RUE Coordination: decreased fine motor LUE Deficits / Details: grossly 4/5, impaired sensation, + pronator drift, impaired finger to nose, RAM, and thumb opposition testing (question whether cognitive/processing versus true [facility] deficit) (pt with hx of previous CVA w/ L side weakness and impaired sensation) -- unsure if current presentation is different than baseline LUE Sensation: decreased light touch LUE Coordination: decreased fine motor   Lower Extremity Assessment Lower Extremity Assessment: Defer to PT evaluation;Overall WFL for tasks assessed;LLE deficits/detail LLE Deficits / Details: pt notes impaired sensation  LLE Sensation: decreased light touch   Cervical / Trunk Assessment Cervical / Trunk Assessment: Kyphotic   Communication Communication Communication: Expressive difficulties   Cognition Arousal/Alertness: Awake/alert Behavior During Therapy: WFL for tasks assessed/performed Overall Cognitive Status: No family/caregiver present to determine baseline cognitive functioning                                 General Comments: Pt alert, oriented to self and somewhat to situation; follows simple 1 step commands and has difficulty with multi step commands, increased processing time   General Comments       Exercises Other Exercises Other Exercises: Pt instructed in hand placement and RW safety to maximize safety and minimize falls risk. Other Exercises: Pt participated in [facility] exercise requiring increasing downgrading to  improve performance - requiring tactile, verbal, and 1 step simple commands to perform   Shoulder Instructions      Home Living Family/patient expects to be discharged to:: Private residence Living Arrangements: Alone Available Help at Discharge: Friend(s);Available 24 hours/day(unclear if truly has 24/7) Type of Home: Apartment Home Access: Level entry     Home Layout: One level     Bathroom Shower/Tub: Teacher, early years/pre: Standard     Home Equipment: Environmental consultant - 2 wheels;Tub bench;Cane - quad          Prior Functioning/Environment Level of Independence: Independent with assistive device(s)  Gait / Transfers Assistance Needed: Ambulates with quad cane for distance ADL's / Homemaking Assistance Needed: Has someone to drive her to pay her bills and get groceries.   Comments: Pt endorses 4 falls in past 12 months due to "losing his balance"        OT Problem List: Decreased strength;Decreased knowledge of use of DME or AE;Decreased coordination;Decreased safety awareness;Impaired balance (sitting and/or standing);Impaired sensation;Decreased activity tolerance;Decreased cognition;Impaired UE functional use      OT Treatment/Interventions: Self-care/ADL training;Balance training;Therapeutic exercise;Therapeutic activities;Energy conservation;Neuromuscular education;Cognitive remediation/compensation;DME and/or AE instruction;Patient/family education    OT Goals(Current goals can be found in the care plan section) Acute Rehab OT Goals Patient Stated Goal: return to PLOF OT Goal Formulation: With patient Time For Goal Achievement: 04/20/17 Potential to Achieve Goals: Good ADL Goals Pt Will Perform Lower Body Dressing: with supervision;sit to/from stand Pt  Will Transfer to Toilet: with supervision;ambulating;regular height toilet(LRAD for ambulation, good safety awareness)  OT Frequency: Min 2X/week   Barriers to D/C: Decreased caregiver support           Co-evaluation              AM-PAC PT "6 Clicks" Daily Activity     Outcome Measure Help from another person eating meals?: A Little Help from another person taking care of personal grooming?: A Little Help from another person toileting, which includes using toliet, bedpan, or urinal?: A Little Help from another person bathing (including washing, rinsing, drying)?: A Little Help from another person to put on and taking off regular upper body clothing?: A Little Help from another person to put on and taking off regular lower body clothing?: A Little 6 Click Score: 18   End of Session Equipment Utilized During Treatment: Gait belt Nurse Communication: Other (comment)(notified RN of pt's BM)  Activity Tolerance: Patient tolerated treatment well Patient left: in bed;with call bell/phone within reach;with bed alarm set  OT Visit Diagnosis: Other abnormalities of gait and mobility (R26.89);Other symptoms and signs involving cognitive function;Repeated falls (R29.6)                Time: 8264-1583 OT Time Calculation (min): 40 min Charges:  OT General Charges $OT Visit: 1 Visit OT Evaluation $OT Eval Low Complexity: 1 Low OT Treatments $Self Care/Home Management : 8-22 mins  Jeni Salles, MPH, MS, OTR/L ascom [phone] 04/06/17, 4:49 PM

## 2017-04-06 NOTE — Progress Notes (Signed)
OT Cancellation Note  Patient Details Name: Samantha Aguirre MRN: [id number] DOB: Dec 09, 1945   Cancelled Treatment:    Reason Eval/Treat Not Completed: Patient at procedure or test/ unavailable. Order received, chart reviewed. Pt out of room for testing. Will re-attempt OT evaluation at later date/time as pt is available and medically appropriate.  Jeni Salles, MPH, MS, OTR/L ascom [phone] 04/06/17, 9:17 AM

## 2017-04-06 NOTE — Progress Notes (Signed)
Patient uses incorrect words relatively frequently.  Asked to go for a walk when she needed the commode.  Referred to a bathrobe as a bathtub.

## 2017-04-06 NOTE — Progress Notes (Signed)
VERY small results from soup suds enema.  Rectal exam performed.  No stool felt in the rectal vault.  Will continue with stool softeners and laxatives

## 2017-04-06 NOTE — Progress Notes (Signed)
No response from suppository,  Patient is passing gas and discharged some mucous .

## 2017-04-06 NOTE — Progress Notes (Signed)
[facility] [place] NAME: Samantha Aguirre    MR#:  [id number]  DATE OF BIRTH:  January 23, 1945  SUBJECTIVE:  CHIEF COMPLAINT:   Chief Complaint  Patient presents with  . Influenza   The patient is off oxygen 2 L [place], but still has wheezing and cough.  Her slurred speech and left-sided weakness are better. REVIEW OF SYSTEMS:  Review of Systems  Constitutional: Positive for malaise/fatigue. Negative for chills and fever.  HENT: Negative for sore throat.   Eyes: Negative for blurred vision and double vision.  Respiratory: Positive for cough, shortness of breath and wheezing. Negative for hemoptysis and stridor.   Cardiovascular: Negative for chest pain, palpitations, orthopnea and leg swelling.  Gastrointestinal: Negative for abdominal pain, blood in stool, diarrhea, melena, nausea and vomiting.  Genitourinary: Negative for dysuria, flank pain and hematuria.  Musculoskeletal: Negative for back pain and joint pain.  Skin: Negative for rash.  Neurological: Positive for speech change, focal weakness and weakness. Negative for dizziness, sensory change, seizures, loss of consciousness and headaches.  Endo/Heme/Allergies: Negative for polydipsia.  Psychiatric/Behavioral: Negative for depression. The patient is not nervous/anxious.     DRUG ALLERGIES:   Allergies  Allergen Reactions  . Mushroom Extract Complex Anaphylaxis    Made patient "deathly sick"  . Atenolol     "MD took me off of it bc it was doing something wrong" Made patient HYPOTENSIVE  . Ivp Dye [Iodinated Diagnostic Agents] Itching    Pt injected with IV contrast.  5 min after injection pt complained of itching behind ear and on her abd.  . Morphine And Related Other (See Comments)    "stops my breathing" NEAR-RESPIRATORY ARREST  . Percocet [Oxycodone-Acetaminophen] Nausea And Vomiting and Other (See Comments)    Stomach pains  . Percodan [Oxycodone-Aspirin] Nausea And  Vomiting and Other (See Comments)    Stomach pains  . Verapamil Other (See Comments)    " MD told me this was screwing me up" MAKES PATIENT HYPOTENSIVE   VITALS:  Blood pressure (!) 141/69, pulse 71, temperature 98.1 F (36.7 C), temperature source Oral, resp. rate 18, height 5\' 1"  (1.549 m), weight 177 lb 4.8 oz (80.4 kg), SpO2 93 %. PHYSICAL EXAMINATION:  Physical Exam  Constitutional: She is oriented to person, place, and time and well-developed, well-nourished, and in no distress.  HENT:  Head: Normocephalic.  Mouth/Throat: Oropharynx is clear and moist.  Eyes: Pupils are equal, round, and reactive to light. Conjunctivae and EOM are normal. No scleral icterus.  Neck: Normal range of motion. Neck supple. No JVD present. No tracheal deviation present.  Cardiovascular: Normal rate, regular rhythm and normal heart sounds. Exam reveals no gallop.  No murmur heard. Pulmonary/Chest: Effort normal. No respiratory distress. She has wheezes. She has no rhonchi. She has no rales.  Abdominal: Soft. Bowel sounds are normal. She exhibits no distension. There is no tenderness. There is no rebound.  Musculoskeletal: Normal range of motion. She exhibits no edema or tenderness.  Neurological: She is alert and oriented to person, place, and time.  No facial droop but has mild 4/5 weakness on the left side.  Skin: No rash noted. No erythema.  Psychiatric: Affect normal.   LABORATORY PANEL:  Female CBC Recent Labs  Lab 04/04/17 0544  WBC 15.2*  HGB 11.7*  HCT 36.6  PLT 267   ------------------------------------------------------------------------------------------------------------------ Chemistries  Recent Labs  Lab 04/03/17 1612 04/04/17 0544  NA  --  140  K  --  4.6  CL  --  105  CO2  --  25  GLUCOSE  --  285*  BUN  --  17  CREATININE  --  0.67  CALCIUM  --  8.9  MG 1.9  --    RADIOLOGY:  [name]  Result Date: 04/05/2017 CLINICAL DATA:  72 y/o F; left facial  droop and left-sided weakness. EXAM: MRI HEAD WITHOUT CONTRAST TECHNIQUE: Multiplanar, multiecho pulse sequences of the brain and surrounding structures were obtained without intravenous contrast. COMPARISON:  04/05/2017 CT head.  10/04/2016 MRI head. FINDINGS: Brain: 5 mm focus of reduced diffusion within the right lateral thalamus (series 100, image 30). Region of T2 FLAIR hyperintense signal abnormality with areas of laminar necrosis involving the left posterior temporal lobe, parietal lobe, and extending to temporal occipital junction with largely increased diffusion compatible with late subacute to early chronic infarction. Background of several stablenonspecific foci of T2 FLAIR hyperintense signal abnormality in subcortical and periventricular white matter are compatible withmoderatechronic microvascular ischemic changes for age. Moderatebrain parenchymal volume loss. Small chronic infarcts within the bilateral cerebellar hemispheres. No susceptibility hypointensity to indicate intracranial hemorrhage. No focal mass effect, extra-axial collection, or effacement of basilar cisterns. Vascular: Normal flow voids. Skull and upper cervical spine: Normal marrow signal. Sinuses/Orbits: Negative. Other: None. IMPRESSION: 1. 5 mm acute/early subacute infarction within the right lateral thalamus. No hemorrhage or mass effect. 2. Late subacute to early chronic infarction involving left posterior cerebral hemisphere. 3. Stable background of moderate chronic microvascular ischemic changes and parenchymal volume loss of the brain. These results will be called to the ordering clinician or representative by the Radiologist Assistant, and communication documented in the PACS or zVision Dashboard. Electronically Signed   By: Kristine Garbe M.D.   On: 04/05/2017 21:18   US Carotid Bilateral  Result Date: 04/06/2017 CLINICAL DATA:  Stroke EXAM: BILATERAL CAROTID DUPLEX ULTRASOUND TECHNIQUE: Pearline Cables scale imaging,  color Doppler and duplex ultrasound were performed of bilateral carotid and vertebral arteries in the neck. COMPARISON:  None. FINDINGS: Criteria: Quantification of carotid stenosis is based on velocity parameters that correlate the residual internal carotid diameter with NASCET-based stenosis levels, using the diameter of the distal internal carotid lumen as the denominator for stenosis measurement. The following velocity measurements were obtained: RIGHT ICA:  63 cm/sec CCA:  53 cm/sec SYSTOLIC ICA/CCA RATIO:  1.2 DIASTOLIC ICA/CCA RATIO:  1.2 ECA:  80 cm/sec LEFT ICA:  57 cm/sec CCA:  67 cm/sec SYSTOLIC ICA/CCA RATIO:  0.9 DIASTOLIC ICA/CCA RATIO:  1.1 ECA:  77 cm/sec RIGHT CAROTID ARTERY: Little if any plaque in the bulb. Low resistance internal carotid Doppler pattern. RIGHT VERTEBRAL ARTERY:  Antegrade. LEFT CAROTID ARTERY: Mild smooth calcified plaque in the bulb. Low resistance internal carotid Doppler pattern is preserved. LEFT VERTEBRAL ARTERY:  Antegrade. IMPRESSION: Less than 50% stenosis in the right and left internal carotid arteries. Electronically Signed   By: Marybelle Killings M.D.   On: 04/06/2017 12:10   [name] Head Wo Contrast  Result Date: 04/06/2017 CLINICAL DATA:  Stroke follow-up EXAM: MRA HEAD WITHOUT CONTRAST TECHNIQUE: Angiographic images of the Circle of Willis were obtained using MRA technique without intravenous contrast. COMPARISON:  MRI head 04/05/2017.  MRA 10/04/2016 FINDINGS: Internal carotid artery widely patent bilaterally. Anterior and middle cerebral arteries patent bilaterally. Multiple areas of moderate stenosis in M2 and M3 branches bilaterally, better seen on today's study. No large vessel occlusion. Distal left vertebral artery is hypoplastic unchanged from the prior study. Distal  right vertebral artery not visualized and may be occluded due to patient positioning. Left PICA patent. Right AICA patent. Mild disease in the basilar. Fetal origin posterior cerebral artery  bilaterally. Moderately severe stenosis right posterior cerebral artery, with progression from the prior study. Left posterior cerebral artery patent. Negative for aneurysm IMPRESSION: Moderate disease in the M2 and M3 branches bilaterally more prominent compared to the prior study Moderately severe stenosis right posterior cerebral artery, with progression from the prior study. Electronically Signed   By: Franchot Gallo M.D.   On: 04/06/2017 13:12   ASSESSMENT AND PLAN:   Acute respiratory failure with hypoxia due to COPD exacerbation. tapered IV Solu-Medrol and changed to prednisone, albuterol as needed and home nebulizer. Weaned off oxygen.  Chronic diastolic CHF, LV EF: 71% -   65%.  Stable, continue Lasix.  Mild leukocytosis, due to steroid.  Chronic A. fib.  Continue Eliquis.  Hypertension.  Continue home hypertension medication.  Hyperglycemia with diabetes.  Due to steroid.   Blood glucose is better controlled. Continue Lantus and continue sliding scale.  Tobacco abuse.  Smoking cessation was counseled for 4 minutes.  Acute CVA and History of recurrent CVA.  Continue Eliquis. PT OT evaluation suggest SNF.  Speech study suggest dysphagia 3 diet. Aspiration the fall precaution.  I discussed with Dr. Doy Mince. All the records are reviewed and case discussed with Care Management/Social Worker. Management plans discussed with the patient, family and they are in agreement.  CODE STATUS: DNR  TOTAL TIME TAKING CARE OF THIS PATIENT: 37 minutes.   More than 50% of the time was spent in counseling/coordination of care: YES  POSSIBLE D/C IN 2 DAYS, DEPENDING ON CLINICAL CONDITION.   Demetrios Loll M.D on 04/06/2017 at 5:59 PM  Between 7am to 6pm - Pager - [phone]  After 6pm go to [email or web address] - [facility]

## 2017-04-06 NOTE — Consult Note (Addendum)
Referring Physician: Bridgett Larsson    Chief Complaint:   HPI: Samantha Aguirre is an 72 y.o. female admitted with COPD exacerbation.  On yesterday while working with PT noted to have left facial droop and left sided weakness.  Patient evaluated by teleneurology and not considered at tPA candidate due to patient being on Eliquis.  Reports improvement in her symptoms today.   Initial NIHSS of 13.    Date last known well: Date: 04/05/2017 Time last known well: Time: 17:00 tPA Given: No: On Eliquis  Past Medical History:  Diagnosis Date  . Acid reflux   . Arthritis    Bilateral knees, hands, feet  . Asthma   . Atrial fibrillation ([place])   . Cancer ([place])   . Chest pain   . CHF (congestive heart failure) (HCC)    Diastolic CHF  . COPD (chronic obstructive pulmonary disease) ([place])   . Coronary artery disease   . Diabetes mellitus without complication ([place])   . Frequent headaches   . Heart attack ([place])   . High cholesterol   . HLD (hyperlipidemia)   . Hypertension   . MI (myocardial infarction) ([place])   . Seizures ([place])   . Skin cancer 2015   Suspected basal cell carcinoma on nose, surgically removed  . Stroke ([place])   . Thyroid disease   . Tremors of nervous system     Past Surgical History:  Procedure Laterality Date  . ABDOMINAL HYSTERECTOMY  1976  . APPENDECTOMY  1980  . CAROTID ENDARTERECTOMY    . CHOLECYSTECTOMY  1980  . COLONOSCOPY N/A 02/20/2017   Procedure: COLONOSCOPY;  Surgeon: Lin Landsman, MD;  Location: [facility] ENDOSCOPY;  Service: Gastroenterology;  Laterality: N/A;  . ESOPHAGOGASTRODUODENOSCOPY N/A 02/20/2017   Procedure: ESOPHAGOGASTRODUODENOSCOPY (EGD);  Surgeon: Lin Landsman, MD;  Location: [facility] ENDOSCOPY;  Service: Gastroenterology;  Laterality: N/A;  . [place]  . KNEE SURGERY     left  . ROTATOR CUFF REPAIR     right  . TONSILLECTOMY  1950    Family History  Problem Relation Age of Onset  . Breast cancer Mother   . Heart disease  Mother   . Stroke Mother   . Cancer Mother   . COPD Mother   . Heart disease Father   . Diabetes Father   . Stroke Father   . Alcohol abuse Sister   . Drug abuse Sister   . Stroke Sister   . Cancer Sister   . Mental illness Sister   . Heart disease Brother   . Arthritis Brother   . Diabetes Brother   . Heart disease Maternal Grandfather   . Heart disease Paternal Grandfather    Social History:  reports that she has been smoking cigarettes.  She has a 25.00 pack-year smoking history. She uses smokeless tobacco. She reports that she does not drink alcohol or use drugs.  Allergies:  Allergies  Allergen Reactions  . Mushroom Extract Complex Anaphylaxis    Made patient "deathly sick"  . Atenolol     "MD took me off of it bc it was doing something wrong" Made patient HYPOTENSIVE  . Ivp Dye [Iodinated Diagnostic Agents] Itching    Pt injected with IV contrast.  5 min after injection pt complained of itching behind ear and on her abd.  . Morphine And Related Other (See Comments)    "stops my breathing" NEAR-RESPIRATORY ARREST  . Percocet [Oxycodone-Acetaminophen] Nausea And Vomiting and Other (See Comments)    Stomach  pains  . Percodan [Oxycodone-Aspirin] Nausea And Vomiting and Other (See Comments)    Stomach pains  . Verapamil Other (See Comments)    " MD told me this was screwing me up" MAKES PATIENT HYPOTENSIVE    Medications:  I have reviewed the patient's current medications. Prior to Admission:  Medications Prior to Admission  Medication Sig Dispense Refill Last Dose  . acetaminophen (TYLENOL) 325 MG tablet Take 650 mg by mouth 2 (two) times daily.    04/03/2017 at AM  . albuterol (PROVENTIL HFA;VENTOLIN HFA) 108 (90 Base) MCG/ACT inhaler Inhale 2 puffs into the lungs every 6 (six) hours as needed for wheezing or shortness of breath. 3 Inhaler 1 PRN at PRN  . apixaban (ELIQUIS) 5 MG TABS tablet Take 5 mg by mouth every 12 (twelve) hours.   04/03/2017 at AM  .  atorvastatin (LIPITOR) 80 MG tablet Take 1 tablet (80 mg total) by mouth daily at 6 PM. 90 tablet 3 04/02/2017 at PM  . diclofenac sodium (VOLTAREN) 1 % GEL Apply 2 g topically 4 (four) times daily. 1 Tube 1 04/03/2017 at PM  . DULoxetine (CYMBALTA) 60 MG capsule Take 1 capsule (60 mg total) by mouth at bedtime. 30 capsule 11 04/02/2017 at PM  . ferrous sulfate 325 (65 FE) MG EC tablet Take 1 tablet (325 mg total) by mouth 2 (two) times daily with a meal. 60 tablet 0 04/03/2017 at AM  . fluticasone (FLONASE) 50 MCG/ACT nasal spray Place 1 spray into both nostrils daily. 48 g 3 04/03/2017 at AM  . fluticasone (FLOVENT HFA) 220 MCG/ACT inhaler Inhale 1 puff into the lungs 2 (two) times daily. 1 Inhaler 0 04/03/2017 at AM  . furosemide (LASIX) 20 MG tablet Take 10 mg by mouth daily.  30 tablet 3 04/03/2017 at AM  . gabapentin (NEURONTIN) 800 MG tablet Take 1 tablet (800 mg total) by mouth 2 (two) times daily. 180 tablet 1 04/03/2017 at AM  . glipiZIDE-metformin (METAGLIP) 5-500 MG tablet Take 1 tablet by mouth 2 (two) times daily before a meal. 180 tablet 1 04/03/2017 at AM  . levETIRAcetam (KEPPRA) 500 MG tablet Take 1 tablet (500 mg total) by mouth 2 (two) times daily. 60 tablet 0 04/03/2017 at AM  . levothyroxine (SYNTHROID, LEVOTHROID) 175 MCG tablet Take 1 tablet (175 mcg total) by mouth daily before breakfast. 90 tablet 3 04/03/2017 at AM  . lisinopril (PRINIVIL,ZESTRIL) 20 MG tablet Take 1 tablet (20 mg total) by mouth daily. 90 tablet 3 04/03/2017 at AM  . nitroGLYCERIN (NITROSTAT) 0.4 MG SL tablet Place 1 tablet (0.4 mg total) under the tongue every 5 (five) minutes as needed for chest pain. 30 tablet 0 PRN at PRN  . omeprazole (PRILOSEC) 20 MG capsule Take 20 mg by mouth 2 (two) times daily.   04/03/2017 at AM  . potassium chloride (MICRO-K) 10 MEQ CR capsule Take 1 capsule (10 mEq total) by mouth daily. 90 capsule 3 04/03/2017 at AM  . predniSONE (DELTASONE) 10 MG tablet 3 tabs po day1; 2 tabs po day2; 1  tab po day 3; 1/2 tab day 4,5 7 tablet 0 04/03/2017 at AM  . sucralfate (CARAFATE) 1 g tablet TAKE (1) TABLET BY MOUTH FOUR TIMES A DAY 360 tablet 1 04/03/2017 at PM  . SUMAtriptan (IMITREX) 25 MG tablet Take 1 tablet (25 mg total) by mouth once as needed for up to 1 dose for migraine. May repeat one dose in 2 hours if persists, max in 24  hrs 12 tablet 0 PRN at PRN  . umeclidinium bromide (INCRUSE ELLIPTA) 62.5 MCG/INH AEPB Inhale 1 puff into the lungs daily. 1 each 11 04/03/2017 at AM   Scheduled: .  stroke: mapping our early stages of recovery book   Does not apply Once  . apixaban  5 mg Oral Q12H  . atorvastatin  80 mg Oral q1800  . budesonide  2 mL Inhalation BID  . docusate sodium  100 mg Oral BID  . DULoxetine  60 mg Oral QHS  . ferrous sulfate  325 mg Oral BID WC  . gabapentin  800 mg Oral BID  . insulin aspart  0-15 Units Subcutaneous TID WC  . insulin aspart  0-5 Units Subcutaneous QHS  . insulin glargine  10 Units Subcutaneous QHS  . levETIRAcetam  500 mg Oral BID  . levothyroxine  175 mcg Oral QAC breakfast  . midazolam  2 mg Intravenous Once  . pantoprazole  40 mg Oral Daily  . potassium chloride  10 mEq Oral Daily  . predniSONE  50 mg Oral Q breakfast  . sodium chloride flush  3 mL Intravenous Q12H  . sucralfate  1 g Oral TID WC & HS  . umeclidinium bromide  1 puff Inhalation Daily    ROS: History obtained from the patient  General ROS: negative for - chills, fatigue, fever, night sweats, weight gain or weight loss Psychological ROS: negative for - behavioral disorder, hallucinations, memory difficulties, mood swings or suicidal ideation Ophthalmic ROS: negative for - blurry vision, double vision, eye pain or loss of vision ENT ROS: negative for - epistaxis, nasal discharge, oral lesions, sore throat, tinnitus or vertigo Allergy and Immunology ROS: negative for - hives or itchy/watery eyes Hematological and Lymphatic ROS: negative for - bleeding problems, bruising or  swollen lymph nodes Endocrine ROS: negative for - galactorrhea, hair pattern changes, polydipsia/polyuria or temperature intolerance Respiratory ROS: shortness of breath, wheezing Cardiovascular ROS: negative for - chest pain, dyspnea on exertion, edema or irregular heartbeat Gastrointestinal ROS: negative for - abdominal pain, diarrhea, hematemesis, nausea/vomiting or stool incontinence Genito-Urinary ROS: negative for - dysuria, hematuria, incontinence or urinary frequency/urgency Musculoskeletal ROS: negative for - joint swelling or muscular weakness Neurological ROS: as noted in HPI Dermatological ROS: negative for rash and skin lesion changes  Physical Examination: Blood pressure (!) 146/64, pulse (!) 56, temperature 98.2 F (36.8 C), resp. rate 18, height 5\' 1"  (1.549 m), weight 80.4 kg (177 lb 4.8 oz), SpO2 95 %.  HEENT-  Normocephalic, no lesions, without obvious abnormality.  Normal external eye and conjunctiva.  Normal TM's bilaterally.  Normal auditory canals and external ears. Normal external nose, mucus membranes and septum.  Normal pharynx. Cardiovascular- S1, S2 normal, pulses palpable throughout   Lungs- chest clear, no wheezing, rales, normal symmetric air entry Abdomen- soft, non-tender; bowel sounds normal; no masses,  no organomegaly Extremities- no edema Lymph-no adenopathy palpable Musculoskeletal-no joint tenderness, deformity or swelling Skin-warm and dry, no hyperpigmentation, vitiligo, or suspicious lesions  Neurological Examination   Mental Status: Alert, oriented, thought content appropriate.  Speech fluent without evidence of aphasia.  Able to follow 3 step commands without difficulty. Cranial Nerves: II: Discs flat bilaterally; Visual fields grossly normal, pupils equal, round, reactive to light and accommodation III,IV, VI: ptosis not present, extra-ocular motions intact bilaterally V,VII: smile symmetric, facial light touch sensation decreased on the  left VIII: hearing normal bilaterally IX,X: gag reflex present XI: bilateral shoulder shrug XII: midline tongue extension Motor: Right :  Upper extremity   5/5    Left:     Upper extremity   5/5  Lower extremity   5/5     Lower extremity   4/5 Tone and bulk:normal tone throughout; no atrophy noted Sensory: Pinprick and light touch decreased on the left Deep Tendon Reflexes: 2+ in the upper extremities, trace at the knees and absent at the ankles Plantars: Right: mute   Left: mute Cerebellar: Normal finger-to-nose and normal heel-to-shin testing bilaterally Gait: not tested due to safety concerns    Laboratory Studies:  Basic Metabolic Panel: Recent Labs  Lab 03/31/17 0841 04/01/17 0553 04/03/17 1603 04/03/17 1612 04/04/17 0544  NA  --  138 138  --  140  K  --  3.4* 3.6  --  4.6  CL  --  103 104  --  105  CO2  --  24 24  --  25  GLUCOSE  --  228* 176*  --  285*  BUN  --  17 19  --  17  CREATININE 0.72 0.65 0.89  --  0.67  CALCIUM  --  8.6* 8.7*  --  8.9  MG  --  1.8  --  1.9  --     Liver Function Tests: No results for input(s): AST, ALT, ALKPHOS, BILITOT, PROT, ALBUMIN in the last 168 hours. No results for input(s): LIPASE, AMYLASE in the last 168 hours. No results for input(s): AMMONIA in the last 168 hours.  CBC: Recent Labs  Lab 03/31/17 0841 04/03/17 1603 04/04/17 0544  WBC 6.0 12.5* 15.2*  NEUTROABS  --  9.4*  --   HGB 11.7* 13.4 11.7*  HCT 35.9 41.8 36.6  MCV 82.1 82.1 82.3  PLT 209 335 267    Cardiac Enzymes: Recent Labs  Lab 04/03/17 1603  TROPONINI <0.03    BNP: Invalid input(s): POCBNP  CBG: Recent Labs  Lab 04/05/17 1841 04/05/17 2044 04/05/17 2202 04/06/17 0750 04/06/17 1124  GLUCAP 225* 170* 174* 182* 141*    Microbiology: Results for orders placed or performed during the hospital encounter of 03/27/17  Culture, blood (routine x 2)     Status: None   Collection Time: 03/27/17 12:44 PM  Result Value Ref Range Status    Specimen Description BLOOD LAC  Final   Special Requests   Final    BOTTLES DRAWN AEROBIC AND ANAEROBIC Blood Culture adequate volume   Culture   Final    NO GROWTH 5 DAYS Performed at [facility], [address], [place], [place] [address]    Report Status 04/01/2017 FINAL  Final  Culture, blood (routine x 2)     Status: None   Collection Time: 03/27/17 12:44 PM  Result Value Ref Range Status   Specimen Description BLOOD RAC  Final   Special Requests   Final    BOTTLES DRAWN AEROBIC AND ANAEROBIC Blood Culture adequate volume   Culture   Final    NO GROWTH 5 DAYS Performed at [facility], [address], [place], [place] [address]    Report Status 04/01/2017 FINAL  Final    Coagulation Studies: No results for input(s): LABPROT, INR in the last 72 hours.  Urinalysis: No results for input(s): COLORURINE, LABSPEC, PHURINE, GLUCOSEU, HGBUR, BILIRUBINUR, KETONESUR, PROTEINUR, UROBILINOGEN, NITRITE, LEUKOCYTESUR in the last 168 hours.  Invalid input(s): APPERANCEUR  Lipid Panel:    Component Value Date/Time   CHOL 132 04/06/2017 0322   TRIG 91 04/06/2017 0322   HDL 51 04/06/2017  0322   CHOLHDL 2.6 04/06/2017 0322   VLDL 18 04/06/2017 0322   LDLCALC 63 04/06/2017 0322    HgbA1C:  Lab Results  Component Value Date   HGBA1C 6.3 (H) 04/06/2017    Urine Drug Screen:  No results found for: LABOPIA, COCAINSCRNUR, LABBENZ, AMPHETMU, THCU, LABBARB  Alcohol Level: No results for input(s): ETH in the last 168 hours.   Imaging: Ct Head Wo Contrast  Result Date: 04/05/2017 CLINICAL DATA:  Left facial droop.  Left-sided weakness. EXAM: CT HEAD WITHOUT CONTRAST TECHNIQUE: Contiguous axial images were obtained from the base of the skull through the vertex without intravenous contrast. COMPARISON:  10/20/2016 FINDINGS: Brain: Age indeterminate subacute to chronic left posterior parietal infarct evident. This is new in the interval since prior study. There is no  evidence for acute hemorrhage, hydrocephalus, mass lesion, or abnormal extra-axial fluid collection. No definite CT evidence for acute infarction. Diffuse loss of parenchymal volume is consistent with atrophy. Patchy low attenuation in the deep hemispheric and periventricular white matter is nonspecific, but likely reflects chronic microvascular ischemic demyelination. Bilateral cerebellar lacunar infarcts are similar to prior. Vascular: Atherosclerotic calcification of the carotid siphons noted at the skull base. No dense MCA sign. Skull: No evidence for fracture. No worrisome lytic or sclerotic lesion. Sinuses/Orbits: The visualized paranasal sinuses and mastoid air cells are clear. Visualized portions of the globes and intraorbital fat are unremarkable. Other: None. IMPRESSION: 1. Posterior left MCA territory subacute to chronic infarct new since 10/20/2016. MRI of the brain may provide additional characterization regarding chronicity as clinically warranted. 2. Atrophy with chronic small vessel white matter disease and bilateral cerebellar lacunar infarcts. Electronically Signed   By: Misty Stanley M.D.   On: 04/05/2017 18:12   [name]  Result Date: 04/05/2017 CLINICAL DATA:  72 y/o F; left facial droop and left-sided weakness. EXAM: MRI HEAD WITHOUT CONTRAST TECHNIQUE: Multiplanar, multiecho pulse sequences of the brain and surrounding structures were obtained without intravenous contrast. COMPARISON:  04/05/2017 CT head.  10/04/2016 MRI head. FINDINGS: Brain: 5 mm focus of reduced diffusion within the right lateral thalamus (series 100, image 30). Region of T2 FLAIR hyperintense signal abnormality with areas of laminar necrosis involving the left posterior temporal lobe, parietal lobe, and extending to temporal occipital junction with largely increased diffusion compatible with late subacute to early chronic infarction. Background of several stablenonspecific foci of T2 FLAIR hyperintense  signal abnormality in subcortical and periventricular white matter are compatible withmoderatechronic microvascular ischemic changes for age. Moderatebrain parenchymal volume loss. Small chronic infarcts within the bilateral cerebellar hemispheres. No susceptibility hypointensity to indicate intracranial hemorrhage. No focal mass effect, extra-axial collection, or effacement of basilar cisterns. Vascular: Normal flow voids. Skull and upper cervical spine: Normal marrow signal. Sinuses/Orbits: Negative. Other: None. IMPRESSION: 1. 5 mm acute/early subacute infarction within the right lateral thalamus. No hemorrhage or mass effect. 2. Late subacute to early chronic infarction involving left posterior cerebral hemisphere. 3. Stable background of moderate chronic microvascular ischemic changes and parenchymal volume loss of the brain. These results will be called to the ordering clinician or representative by the Radiologist Assistant, and communication documented in the PACS or zVision Dashboard. Electronically Signed   By: Kristine Garbe M.D.   On: 04/05/2017 21:18   US Carotid Bilateral  Result Date: 04/06/2017 CLINICAL DATA:  Stroke EXAM: BILATERAL CAROTID DUPLEX ULTRASOUND TECHNIQUE: Pearline Cables scale imaging, color Doppler and duplex ultrasound were performed of bilateral carotid and vertebral arteries in the neck. COMPARISON:  None.  FINDINGS: Criteria: Quantification of carotid stenosis is based on velocity parameters that correlate the residual internal carotid diameter with NASCET-based stenosis levels, using the diameter of the distal internal carotid lumen as the denominator for stenosis measurement. The following velocity measurements were obtained: RIGHT ICA:  63 cm/sec CCA:  53 cm/sec SYSTOLIC ICA/CCA RATIO:  1.2 DIASTOLIC ICA/CCA RATIO:  1.2 ECA:  80 cm/sec LEFT ICA:  57 cm/sec CCA:  67 cm/sec SYSTOLIC ICA/CCA RATIO:  0.9 DIASTOLIC ICA/CCA RATIO:  1.1 ECA:  77 cm/sec RIGHT CAROTID ARTERY: Little if  any plaque in the bulb. Low resistance internal carotid Doppler pattern. RIGHT VERTEBRAL ARTERY:  Antegrade. LEFT CAROTID ARTERY: Mild smooth calcified plaque in the bulb. Low resistance internal carotid Doppler pattern is preserved. LEFT VERTEBRAL ARTERY:  Antegrade. IMPRESSION: Less than 50% stenosis in the right and left internal carotid arteries. Electronically Signed   By: Marybelle Killings M.D.   On: 04/06/2017 12:10   [name] Head Wo Contrast  Result Date: 04/06/2017 CLINICAL DATA:  Stroke follow-up EXAM: MRA HEAD WITHOUT CONTRAST TECHNIQUE: Angiographic images of the Circle of Willis were obtained using MRA technique without intravenous contrast. COMPARISON:  MRI head 04/05/2017.  MRA 10/04/2016 FINDINGS: Internal carotid artery widely patent bilaterally. Anterior and middle cerebral arteries patent bilaterally. Multiple areas of moderate stenosis in M2 and M3 branches bilaterally, better seen on today's study. No large vessel occlusion. Distal left vertebral artery is hypoplastic unchanged from the prior study. Distal right vertebral artery not visualized and may be occluded due to patient positioning. Left PICA patent. Right AICA patent. Mild disease in the basilar. Fetal origin posterior cerebral artery bilaterally. Moderately severe stenosis right posterior cerebral artery, with progression from the prior study. Left posterior cerebral artery patent. Negative for aneurysm IMPRESSION: Moderate disease in the M2 and M3 branches bilaterally more prominent compared to the prior study Moderately severe stenosis right posterior cerebral artery, with progression from the prior study. Electronically Signed   By: Franchot Gallo M.D.   On: 04/06/2017 13:12    Assessment: 72 y.o. female with a history of atrial fibrillation on Eliquis who experienced acute onset of a left hemiparesis on yesterday.  MRI of the brain performed and shows an acute right thalamic infarct and subacute left posterior temporal infarct.   Infarct likely embolic in etiology.  MRA shows moderate bilateral M2 and M3 and right PCA atherosclerotic disease.  Patient on Eliquis.  LDL 63.  A1c 6.3.  Carotid dopplers show no evidence of hemodynamically significant stenosis.     Stroke Risk Factors - atrial fibrillation, diabetes mellitus, hyperlipidemia, hypertension and smoking  Plan: 1. PT consult, OT consult, Speech consult 2. NPO until RN stroke swallow screen 3. Telemetry monitoring 4. Frequent neuro checks 5. Continue Eliquis 6. Smoking cessation counseling  Alexis Goodell, MD Neurology [phone] 04/06/2017, 1:47 PM

## 2017-04-06 NOTE — Progress Notes (Signed)
*  PRELIMINARY RESULTS* Echocardiogram 2D Echocardiogram has been performed.  Sherrie Sport 04/06/2017, 11:02 AM

## 2017-04-06 NOTE — Progress Notes (Signed)
MRI called at 10:30 to tell me they would call for transport at 11:00.  The patient needed meds for clostrophobia prior to the test.  Was unable to contact Dr. Bridgett Larsson for med orders for 25 minutes.  MRI notified that the premed wasn't given until 10:55.  Scan will be delayed until 11:30.

## 2017-04-06 NOTE — Progress Notes (Signed)
Pt shows improvement with neuro checks. Her left side has become stronger and she can lift her left leg at last check, which she was unable to do at 2200. Pt is also answering more approprietly but still shows confusion at times.

## 2017-04-06 NOTE — Progress Notes (Signed)
Patient continues on enteric precautions although no stool sample has been obtained for testing and no treatment initiated.  She has had no stool since 3/21.  For SNF placement she must have had a stool within the last 3 days.  Will ask MD for laxative order.

## 2017-04-06 NOTE — Consult Note (Signed)
   [facility] CM Inpatient Consult   04/06/2017  Samantha Aguirre [birth year] [id number]    Referral received from inpatient [facility] for [place] Management.   Patient recently became active with [facility] Care Management. Please see chart review tab then encounters for patient outreach details by [facility] RNCM.   Spoke with [place] RNCM, Rose. She is aware of patient's hospitalization.   Left voicemail message for inpatient RNCM [facility]) to make aware [facility] is active.   Marthenia Rolling, MSN-Ed, RN,BSN [facility] Liaison [phone]

## 2017-04-06 NOTE — Care Management Important Message (Signed)
Important Message  Patient Details  Name: Samantha Aguirre MRN: [id number] Date of Birth: 01-11-1946   Medicare Important Message Given:  Yes Signed IM notice given    Katrina Stack, RN 04/06/2017, 5:16 PM

## 2017-04-07 DIAGNOSIS — I639 Cerebral infarction, unspecified: Secondary | ICD-10-CM

## 2017-04-07 DIAGNOSIS — Z515 Encounter for palliative care: Secondary | ICD-10-CM

## 2017-04-07 DIAGNOSIS — Z7189 Other specified counseling: Secondary | ICD-10-CM

## 2017-04-07 DIAGNOSIS — I63511 Cerebral infarction due to unspecified occlusion or stenosis of right middle cerebral artery: Secondary | ICD-10-CM

## 2017-04-07 LAB — GLUCOSE, CAPILLARY
GLUCOSE-CAPILLARY: 241 mg/dL — AB (ref 65–99)
GLUCOSE-CAPILLARY: 377 mg/dL — AB (ref 65–99)
GLUCOSE-CAPILLARY: 234 mg/dL — AB (ref 65–99)
Glucose-Capillary: 176 mg/dL — ABNORMAL HIGH (ref 65–99)
Glucose-Capillary: 189 mg/dL — ABNORMAL HIGH (ref 65–99)

## 2017-04-07 LAB — CBC
HEMATOCRIT: 36.3 % (ref 35.0–47.0)
HEMOGLOBIN: 11.9 g/dL — AB (ref 12.0–16.0)
MCH: 27.1 pg (ref 26.0–34.0)
MCHC: 32.6 g/dL (ref 32.0–36.0)
MCV: 83 fL (ref 80.0–100.0)
Platelets: 262 10*3/uL (ref 150–440)
RBC: 4.38 MIL/uL (ref 3.80–5.20)
RDW: 24.3 % — ABNORMAL HIGH (ref 11.5–14.5)
WBC: 8.5 10*3/uL (ref 3.6–11.0)

## 2017-04-07 LAB — CREATININE, SERUM
Creatinine, Ser: 0.65 mg/dL (ref 0.44–1.00)
GFR calc Af Amer: >60 mL/min (ref >60)

## 2017-04-07 MED ORDER — GUAIFENESIN-DM 100-10 MG/5ML PO SYRP: 5.0000 mL | ORAL_SOLUTION | ORAL | 0 refills | Status: DC | PRN

## 2017-04-07 MED ORDER — PREDNISONE 10 MG PO TABS: ORAL_TABLET | ORAL | Status: DC

## 2017-04-07 MED ORDER — FLEET ENEMA 7-19 GM/118ML RE ENEM
1.0000 | ENEMA | Freq: Every day | RECTAL | Status: DC | PRN
Start: 2017-04-07 — End: 2017-04-08
  Administered 2017-04-07: 1 via RECTAL
  Filled 2017-04-07: qty 1

## 2017-04-07 NOTE — Progress Notes (Signed)
Physical Therapy Treatment Patient Details Name: Samantha Aguirre MRN: [id number] DOB: Feb 22, 1945 Today's Date: 04/07/2017    History of Present Illness Pt is a 72 y.o. female with a known history of diastolic heart failure, bronchial asthma, atrial fibrillation, COPD, hyperlipidemia, diabetes mellitus, seizure disorder, skin cancer admitted to the emergency room with increased shortness of breath, cough and congestion. Patient also had some chills, body aches and a low-grade fever with flu test was positive in the emergency room. Patient was put on oxygen by nasal cannula for low oxygen saturationand stabilized and given Tamiflu.  While in acute care on 04/05/17 staff noticed L-sided weakness with neurology noting pt to be positive for acute R thalamic infarct and subacute L posterior temporal infarct.  Assessment also includes: acute respiratory failure with hypoxia due to COPD exacerbation, chronic CHF, chronic A-fib, and HTN.      PT Comments    Pt to edge of bed with supervision.  Initially unsteady upon sitting but quickly gains balance with rail.  She was able to stand and ambulate around nursing unit today with walker and min a x 1.  Verbal cues not to reach our for nursing station to steady herself. No buckling noted but she does reports fatigue which increases with distance.  Verbal cues for general safety.  BERG balance test completed.  14/56 points.  Despite increased ambulation distance she remains at an increased risk for falls and is unsafe to walk or transfer unattended at this time.  SNF remains appropriate to increase functional independence.   Follow Up Recommendations  SNF     Equipment Recommendations  Rolling walker with 5" wheels;Other (comment)    Recommendations for Other Services       Precautions / Restrictions Precautions Precautions: Fall Restrictions Weight Bearing Restrictions: No    Mobility  Bed Mobility Overal bed mobility: Modified Independent Bed  Mobility: Supine to Sit     Supine to sit: Supervision;Modified independent (Device/Increase time)     General bed mobility comments: Extra time and effort during sup to sit but no physical assistance required  Transfers Overall transfer level: Needs assistance Equipment used: Rolling walker (2 wheeled) Transfers: Sit to/from Stand Sit to Stand: Min assist;Min guard            Ambulation/Gait Ambulation/Gait assistance: Min assist Ambulation Distance (Feet): 200 Feet Assistive device: Rolling walker (2 wheeled) Gait Pattern/deviations: Step-to pattern;Decreased step length - right;Decreased step length - left;Narrow base of support   Gait velocity interpretation: <1.8 ft/sec, indicative of risk for recurrent falls General Gait Details: reports LE weakness but no bucking noted.  did reach out for counter x 1 to steady herself.   Stairs            Wheelchair Mobility    Modified Rankin (Stroke Patients Only)       Balance Overall balance assessment: Needs assistance Sitting-balance support: No upper extremity supported;Feet supported Sitting balance-Leahy Scale: Good     Standing balance support: Bilateral upper extremity supported;During functional activity Standing balance-Leahy Scale: Poor Standing balance comment: L lateral instability in standing                 Standardized Balance Assessment Standardized Balance Assessment : Berg Balance Test Berg Balance Test Sit to Stand: Needs minimal aid to stand or to stabilize Standing Unsupported: Needs several tries to stand 30 seconds unsupported Sitting with Back Unsupported but Feet Supported on Floor or Stool: Able to sit safely and securely 2 minutes Stand to Sit:  Controls descent by using hands Transfers: Needs one person to assist Standing Unsupported with Eyes Closed: Needs help to keep from falling Standing Ubsupported with Feet Together: Needs help to attain position and unable to hold for 15  seconds From Standing, Reach Forward with Outstretched Arm: Reaches forward but needs supervision From Standing Position, Pick up Object from Floor: Unable to pick up shoe, but reaches 2-5 cm (1-2") from shoe and balances independently From Standing Position, Turn to Look Behind Over each Shoulder: Needs assist to keep from losing balance and falling Turn 360 Degrees: Needs assistance while turning Standing Unsupported, Alternately Place Feet on Step/Stool: Needs assistance to keep from falling or unable to try Standing Unsupported, One Foot in Front: Loses balance while stepping or standing Standing on One Leg: Tries to lift leg/unable to hold 3 seconds but remains standing independently Total Score: 14        Cognition Arousal/Alertness: Awake/alert Behavior During Therapy: WFL for tasks assessed/performed Overall Cognitive Status: No family/caregiver present to determine baseline cognitive functioning                                 General Comments: some confusion noted at times during session      Exercises      General Comments General comments (skin integrity, edema, etc.): high risk for falls indicated by BERG      Pertinent Vitals/Pain Pain Assessment: No/denies pain    Home Living                      Prior Function            PT Goals (current goals can now be found in the care plan section) Progress towards PT goals: Progressing toward goals    Frequency    7X/week      PT Plan Current plan remains appropriate    Co-evaluation              AM-PAC PT "6 Clicks" Daily Activity  Outcome Measure  Difficulty turning over in bed (including adjusting bedclothes, sheets and blankets)?: A Little Difficulty moving from lying on back to sitting on the side of the bed? : A Little Difficulty sitting down on and standing up from a chair with arms (e.g., wheelchair, bedside commode, etc,.)?: Unable Help needed moving to and from a  bed to chair (including a wheelchair)?: A Little Help needed walking in hospital room?: A Little Help needed climbing 3-5 steps with a railing? : A Lot 6 Click Score: 15    End of Session Equipment Utilized During Treatment: Gait belt Activity Tolerance: Patient limited by fatigue Patient left: in chair;with chair alarm set;with call bell/phone within reach         Time: 0914-0929 PT Time Calculation (min) (ACUTE ONLY): 15 min  Charges:  $Gait Training: 8-22 mins                    G Codes:       Chesley Noon, PTA 04/07/17, 10:49 AM

## 2017-04-07 NOTE — Discharge Summary (Signed)
[facility] [place] NAME: Samantha Aguirre    MR#:  [id number]  DATE OF BIRTH:  07-20-1945  DATE OF ADMISSION:  04/03/2017   ADMITTING PHYSICIAN: Demetrios Loll, MD  DATE OF DISCHARGE:  04/07/2017  PRIMARY CARE PHYSICIAN: [facility]   ADMISSION DIAGNOSIS:  Respiratory distress [R06.03] COPD exacerbation (HCC) [J44.1] DISCHARGE DIAGNOSIS:  Active Problems:   COPD exacerbation (HCC)  SECONDARY DIAGNOSIS:   Past Medical History:  Diagnosis Date  . Acid reflux   . Arthritis    Bilateral knees, hands, feet  . Asthma   . Atrial fibrillation ([place])   . Cancer ([place])   . Chest pain   . CHF (congestive heart failure) (HCC)    Diastolic CHF  . COPD (chronic obstructive pulmonary disease) ([place])   . Coronary artery disease   . Diabetes mellitus without complication ([place])   . Frequent headaches   . Heart attack ([place])   . High cholesterol   . HLD (hyperlipidemia)   . Hypertension   . MI (myocardial infarction) ([place])   . Seizures ([place])   . Skin cancer 2015   Suspected basal cell carcinoma on nose, surgically removed  . Stroke ([place])   . Thyroid disease   . Tremors of nervous system    HOSPITAL COURSE:  Samantha Aguirre  is a 72 y.o. female with a known history of multiple medical problems as below.  Patient was just discharged to home after treatment of COPD exacerbation yesterday.  She feels worsening shortness of breath.   Acute respiratory failure with hypoxia due to COPD exacerbation. tapered IV Solu-Medrol and changed to prednisone taper, albuterol as needed and home nebulizer. Weaned off oxygen.  Chronic diastolic CHF, LV EF: 96% - 65%. Stable, continue Lasix.  Mild leukocytosis, due to steroid.  Chronic A. fib. Continue Eliquis.  Hypertension.  Continue home hypertension medication.  Hyperglycemia with diabetes.  Due to steroid.  Blood glucose is better controlled. Continue Lantus and continue sliding  scale.  Tobacco abuse. Smoking cessation was counseled for 4 minutes.  Acute CVA and History of recurrent CVA.  She had facial droop, slurred speech and left side weakness, found to have acute CVA during PT Continue Eliquis. PT OT evaluation suggest SNF.  Speech study suggest dysphagia 3 diet. Aspiration the fall precaution.  I discussed with Dr. Doy Mince. DISCHARGE CONDITIONS:  Stable, discharge to SNF today. CONSULTS OBTAINED:  Treatment Team:  Alexis Goodell, MD DRUG ALLERGIES:   Allergies  Allergen Reactions  . Mushroom Extract Complex Anaphylaxis    Made patient "deathly sick"  . Atenolol     "MD took me off of it bc it was doing something wrong" Made patient HYPOTENSIVE  . Ivp Dye [Iodinated Diagnostic Agents] Itching    Pt injected with IV contrast.  5 min after injection pt complained of itching behind ear and on her abd.  . Morphine And Related Other (See Comments)    "stops my breathing" NEAR-RESPIRATORY ARREST  . Percocet [Oxycodone-Acetaminophen] Nausea And Vomiting and Other (See Comments)    Stomach pains  . Percodan [Oxycodone-Aspirin] Nausea And Vomiting and Other (See Comments)    Stomach pains  . Verapamil Other (See Comments)    " MD told me this was screwing me up" MAKES PATIENT HYPOTENSIVE   DISCHARGE MEDICATIONS:   Allergies as of 04/07/2017      Reactions   Mushroom Extract Complex Anaphylaxis   Made patient "deathly sick"   Atenolol    "  MD took me off of it bc it was doing something wrong" Made patient HYPOTENSIVE   Ivp Dye [iodinated Diagnostic Agents] Itching   Pt injected with IV contrast.  5 min after injection pt complained of itching behind ear and on her abd.   Morphine And Related Other (See Comments)   "stops my breathing" NEAR-RESPIRATORY ARREST   Percocet [oxycodone-acetaminophen] Nausea And Vomiting, Other (See Comments)   Stomach pains   Percodan [oxycodone-aspirin] Nausea And Vomiting, Other (See Comments)   Stomach  pains   Verapamil Other (See Comments)   " MD told me this was screwing me up" MAKES PATIENT HYPOTENSIVE      Medication List    TAKE these medications   albuterol 108 (90 Base) MCG/ACT inhaler Commonly known as:  PROVENTIL HFA;VENTOLIN HFA Inhale 2 puffs into the lungs every 6 (six) hours as needed for wheezing or shortness of breath. Notes to patient:  None given today   apixaban 5 MG Tabs tablet Commonly known as:  ELIQUIS Take 5 mg by mouth every 12 (twelve) hours.   atorvastatin 80 MG tablet Commonly known as:  LIPITOR Take 1 tablet (80 mg total) by mouth daily at 6 PM.   diclofenac sodium 1 % Gel Commonly known as:  VOLTAREN Apply 2 g topically 4 (four) times daily. Notes to patient:  None today   DULoxetine 60 MG capsule Commonly known as:  CYMBALTA Take 1 capsule (60 mg total) by mouth at bedtime.   ferrous sulfate 325 (65 FE) MG EC tablet Take 1 tablet (325 mg total) by mouth 2 (two) times daily with a meal.   fluticasone 220 MCG/ACT inhaler Commonly known as:  FLOVENT HFA Inhale 1 puff into the lungs 2 (two) times daily.   fluticasone 50 MCG/ACT nasal spray Commonly known as:  FLONASE Place 1 spray into both nostrils daily.   furosemide 20 MG tablet Commonly known as:  LASIX Take 10 mg by mouth daily.   gabapentin 800 MG tablet Commonly known as:  NEURONTIN Take 1 tablet (800 mg total) by mouth 2 (two) times daily.   glipiZIDE-metformin 5-500 MG tablet Commonly known as:  METAGLIP Take 1 tablet by mouth 2 (two) times daily before a meal.   guaiFENesin-dextromethorphan 100-10 MG/5ML syrup Commonly known as:  ROBITUSSIN DM Take 5 mLs by mouth every 4 (four) hours as needed for cough.   levETIRAcetam 500 MG tablet Commonly known as:  KEPPRA Take 1 tablet (500 mg total) by mouth 2 (two) times daily.   levothyroxine 175 MCG tablet Commonly known as:  SYNTHROID, LEVOTHROID Take 1 tablet (175 mcg total) by mouth daily before breakfast.   lisinopril  20 MG tablet Commonly known as:  PRINIVIL,ZESTRIL Take 1 tablet (20 mg total) by mouth daily.   nitroGLYCERIN 0.4 MG SL tablet Commonly known as:  NITROSTAT Place 1 tablet (0.4 mg total) under the tongue every 5 (five) minutes as needed for chest pain.   omeprazole 20 MG capsule Commonly known as:  PRILOSEC Take 20 mg by mouth 2 (two) times daily.   potassium chloride 10 MEQ CR capsule Commonly known as:  MICRO-K Take 1 capsule (10 mEq total) by mouth daily.   predniSONE 10 MG tablet Commonly known as:  DELTASONE 40 mg po daily 1 day, 30 mg po daily 1 day,20 mg po daily 1 day, 10 mg po daily 1 day. Start taking on:  04/08/2017 What changed:  additional instructions   sucralfate 1 g tablet Commonly known as:  CARAFATE TAKE (1) TABLET BY MOUTH FOUR TIMES A DAY   SUMAtriptan 25 MG tablet Commonly known as:  IMITREX Take 1 tablet (25 mg total) by mouth once as needed for up to 1 dose for migraine. May repeat one dose in 2 hours if persists, max in 24 hrs Notes to patient:  None today   TYLENOL 325 MG tablet Generic drug:  acetaminophen Take 650 mg by mouth 2 (two) times daily.   umeclidinium bromide 62.5 MCG/INH Aepb Commonly known as:  INCRUSE ELLIPTA Inhale 1 puff into the lungs daily.        DISCHARGE INSTRUCTIONS:  See AVS.  If you experience worsening of your admission symptoms, develop shortness of breath, life threatening emergency, suicidal or homicidal thoughts you must seek medical attention immediately by calling 911 or calling your MD immediately  if symptoms less severe.  You Must read complete instructions/literature along with all the possible adverse reactions/side effects for all the Medicines you take and that have been prescribed to you. Take any new Medicines after you have completely understood and accpet all the possible adverse reactions/side effects.   Please note  You were cared for by a hospitalist during your hospital stay. If you have any  questions about your discharge medications or the care you received while you were in the hospital after you are discharged, you can call the unit and asked to speak with the hospitalist on call if the hospitalist that took care of you is not available. Once you are discharged, your primary care physician will handle any further medical issues. Please note that NO REFILLS for any discharge medications will be authorized once you are discharged, as it is imperative that you return to your primary care physician (or establish a relationship with a primary care physician if you do not have one) for your aftercare needs so that they can reassess your need for medications and monitor your lab values.    On the day of Discharge:  VITAL SIGNS:  Blood pressure 127/90, pulse (!) 56, temperature 97.7 F (36.5 C), temperature source Oral, resp. rate 16, height 5\' 1"  (1.549 m), weight 178 lb 6.4 oz (80.9 kg), SpO2 97 %. PHYSICAL EXAMINATION:  GENERAL:  72 y.o.-year-old patient lying in the bed with no acute distress.  EYES: Pupils equal, round, reactive to light and accommodation. No scleral icterus. Extraocular muscles intact.  HEENT: Head atraumatic, normocephalic. Oropharynx and nasopharynx clear.  NECK:  Supple, no jugular venous distention. No thyroid enlargement, no tenderness.  LUNGS: Normal breath sounds bilaterally, mild wheezing, no rales,rhonchi or crepitation. No use of accessory muscles of respiration.  CARDIOVASCULAR: S1, S2 normal. No murmurs, rubs, or gallops.  ABDOMEN: Soft, non-tender, non-distended. Bowel sounds present. No organomegaly or mass.  EXTREMITIES: No pedal edema, cyanosis, or clubbing.  NEUROLOGIC: Cranial nerves II through XII are intact. Muscle strength 5/5 in right but 4/5 in extremities. Sensation intact. Gait not checked.  PSYCHIATRIC: The patient is alert and oriented x 3.  SKIN: No obvious rash, lesion, or ulcer.  DATA REVIEW:   CBC Recent Labs  Lab 04/07/17 0429    WBC 8.5  HGB 11.9*  HCT 36.3  PLT 262    Chemistries  Recent Labs  Lab 04/03/17 1612 04/04/17 0544 04/07/17 0429  NA  --  140  --   K  --  4.6  --   CL  --  105  --   CO2  --  25  --   GLUCOSE  --  285*  --   BUN  --  17  --   CREATININE  --  0.67 0.65  CALCIUM  --  8.9  --   MG 1.9  --   --      Microbiology Results  Results for orders placed or performed during the hospital encounter of 03/27/17  Culture, blood (routine x 2)     Status: None   Collection Time: 03/27/17 12:44 PM  Result Value Ref Range Status   Specimen Description BLOOD LAC  Final   Special Requests   Final    BOTTLES DRAWN AEROBIC AND ANAEROBIC Blood Culture adequate volume   Culture   Final    NO GROWTH 5 DAYS Performed at [facility], [address] [place], [place] [address]    Report Status 04/01/2017 FINAL  Final  Culture, blood (routine x 2)     Status: None   Collection Time: 03/27/17 12:44 PM  Result Value Ref Range Status   Specimen Description BLOOD RAC  Final   Special Requests   Final    BOTTLES DRAWN AEROBIC AND ANAEROBIC Blood Culture adequate volume   Culture   Final    NO GROWTH 5 DAYS Performed at [facility], [address], [place],  [address]    Report Status 04/01/2017 FINAL  Final    RADIOLOGY:  No results found.   Management plans discussed with the patient, family and they are in agreement.  CODE STATUS: DNR   TOTAL TIME TAKING CARE OF THIS PATIENT: 35 minutes.    Demetrios Loll M.D on 04/07/2017 at 2:21 PM  Between 7am to 6pm - Pager - [phone]  After 6pm go to [email or web address] - Proofreader  [employer] [facility]  Office  [phone]  CC: Primary care physician; Katheren Shams   Note: This dictation was prepared with Dragon dictation along with smaller phrase technology. Any transcriptional errors that result from this process are unintentional.

## 2017-04-07 NOTE — Consult Note (Signed)
Consultation Note Date: 04/07/2017   Patient Name: Samantha Aguirre  DOB: [birth year]  MRN: [id number]  Age / Sex: 72 y.o., female  PCP: Katheren Shams Referring Physician: Demetrios Loll, MD  Reason for Consultation: Establishing goals of care  HPI/Patient Profile: 72 y.o. female  admitted on 04/03/2017 with increased shortness of breath with COPD exacerbation. She has a past medical history of COPD, asthma, arthritis, dCHF, CAD, DM, MI, HLD, HTN, seizures, CVA, and thyroid disease. She was discharged a day prior to returning to ED for Influenza A and COPD exacerbation. She used her home nebulizer without improvement.  She presented to ED with wheezing, shortness of breath, tachycardia and tachypnea due to COPD exacerbation.  She was treated with IV Solu-Medrol and nebulizer with little improvement. During the course of her stay she developed left facial droop and left-sided weakness during her physical therapy evaluation. She was found to have an acute stroke right subcortical infart likely small vessel disease vs reactivation of prior CVA. Per Neurology she was not a tpa candidate due to current eliquis dosing. Symptoms have somewhat improved over the course of 24hrs.   Palliative was consulted for goals of care conversation.   Clinical Assessment and Goals of Care: I have reviewed medical records including labs, imaging, progress notes, and MAR, received report from bedside RN, and assessed the patient. Guillermina [place], NP and myself met at the bedside with to discuss diagnosis prognosis, GOC, disposition and options. Patient is A&O x4 and able to participate appropriately.   I introduced Palliative Medicine as specialized medical care for people living with serious illness. It focuses on providing relief from the symptoms and stress of a serious illness. The goal is to improve quality of life for both the patient and  the family. She has a daughter and 3 grandchildren who live near her. Her son lives in [place] is battling health issues himself (recent leg amputation, MI, and CABG).   We discussed a brief life review of the patient. She reports she lives alone however her daughter spends a lot of time with her. She feels that family is the most important thing to her.   As far as functional and nutritional status she reports being able to independently care for herself prior to admission. She did state when she is sick she is unable to care for self and requires more assistance from family. She reports being mobile around her home and performing ADLs independently.   We discussed her current illness and what it means in the larger context of her on-going co-morbidities.  Natural disease trajectory and expectations at EOL were discussed. She verbalized her awareness of having a stroke and increased fatigue and weakness. She is aware that at this time for her safety and of her best interest discharging to a SNF for rehab will be the most safe and effective way to continue to manager her care. She does not like the fact of rehab but voices her awareness of the residual of having a stroke and the deconditioning of  her body due to multiple admissions. She reports wanting to rehab and get back to her baseline.   I attempted to elicit values and goals of care important to the patient.    The difference between aggressive medical intervention and comfort care was considered in light of the patient's goals of care. She is a DNR/DNI. Her goal is to participate in rehab services and return home to her family hopefully at baseline prior to admission and to be as independent as possible, however we discussed that she may continue to have some weakness and fatigue. She verbalized understanding.   Questions and concerns were addressed. The family was encouraged to call with questions or concerns.  PMT will continue to support  holistically.   Patient is able to make medical decisions for herself. There is no established POA however, daughter Ranee Peasley, would be decision maker if needed per patient.     SUMMARY OF RECOMMENDATIONS    DNR/DNI  Continue to treat the treatable per patient  CSW to continue working with attending provider for SNF/Rehab placement. Patient verbalizes goal is to participate in rehab at the facility with hopes for strength improvement and returning home to family.   Would recommend outpatient Palliative services to be of extra support and evaluate patient's status over time.   Code Status/Advance Care Planning:  DNR - did not discuss today  Palliative Prophylaxis:   Bowel Regimen and Frequent Pain Assessment  Additional Recommendations (Limitations, Scope, Preferences):  Full Scope Treatment  Psycho-social/Spiritual:   Desire for further Chaplaincy support:no   Prognosis:   Unable to determine- guarded in the setting of COPD (with multiple admissions due to exacerbations), DM, dCHF, CAD, and acute CVA.   Discharge Planning: [facility] for rehab with Palliative care service follow-up      Primary Diagnoses: Present on Admission: . COPD exacerbation ([place])   I have reviewed the medical record, interviewed the patient and family, and examined the patient. The following aspects are pertinent.  Past Medical History:  Diagnosis Date  . Acid reflux   . Arthritis    Bilateral knees, hands, feet  . Asthma   . Atrial fibrillation ([place])   . Cancer ([place])   . Chest pain   . CHF (congestive heart failure) (HCC)    Diastolic CHF  . COPD (chronic obstructive pulmonary disease) ([place])   . Coronary artery disease   . Diabetes mellitus without complication ([place])   . Frequent headaches   . Heart attack ([place])   . High cholesterol   . HLD (hyperlipidemia)   . Hypertension   . MI (myocardial infarction) ([place])   . Seizures ([place])   . Skin cancer 2015    Suspected basal cell carcinoma on nose, surgically removed  . Stroke ([place])   . Thyroid disease   . Tremors of nervous system    Social History   Socioeconomic History  . Marital status: Divorced    Spouse name: Not on file  . Number of children: 2  . Years of education: 65  . Highest education level: Not on file  Occupational History  . Occupation: Disabled  Social Needs  . Financial resource strain: Somewhat hard  . Food insecurity:    Worry: Sometimes true    Inability: Sometimes true  . Transportation needs:    Medical: No    Non-medical: No  Tobacco Use  . Smoking status: Current Every Day Smoker    Packs/day: 0.50    Years: 50.00  Pack years: 25.00    Types: Cigarettes  . Smokeless tobacco: Current User  Substance and Sexual Activity  . Alcohol use: No  . Drug use: No  . Sexual activity: Never  Lifestyle  . Physical activity:    Days per week: 0 days    Minutes per session: 0 min  . Stress: To some extent  Relationships  . Social connections:    Talks on phone: More than three times a week    Gets together: More than three times a week    Attends religious service: Never    Active member of club or organization: No    Attends meetings of clubs or organizations: Never    Relationship status: Widowed  Other Topics Concern  . Not on file  Social History Narrative  . Not on file   Family History  Problem Relation Age of Onset  . Breast cancer Mother   . Heart disease Mother   . Stroke Mother   . Cancer Mother   . COPD Mother   . Heart disease Father   . Diabetes Father   . Stroke Father   . Alcohol abuse Sister   . Drug abuse Sister   . Stroke Sister   . Cancer Sister   . Mental illness Sister   . Heart disease Brother   . Arthritis Brother   . Diabetes Brother   . Heart disease Maternal Grandfather   . Heart disease Paternal Grandfather    Scheduled Meds: .  stroke: mapping our early stages of recovery book   Does not apply Once  .  atorvastatin  80 mg Oral q1800  . budesonide  2 mL Inhalation BID  . docusate sodium  100 mg Oral BID  . DULoxetine  60 mg Oral QHS  . ferrous sulfate  325 mg Oral BID WC  . gabapentin  800 mg Oral BID  . insulin aspart  0-15 Units Subcutaneous TID WC  . insulin aspart  0-5 Units Subcutaneous QHS  . insulin glargine  10 Units Subcutaneous QHS  . levETIRAcetam  500 mg Oral BID  . levothyroxine  175 mcg Oral QAC breakfast  . midazolam  2 mg Intravenous Once  . pantoprazole  40 mg Oral Daily  . potassium chloride  10 mEq Oral Daily  . predniSONE  50 mg Oral Q breakfast  . sodium chloride flush  3 mL Intravenous Q12H  . sucralfate  1 g Oral TID WC & HS  . umeclidinium bromide  1 puff Inhalation Daily   Continuous Infusions: . sodium chloride     PRN Meds:.sodium chloride, albuterol, bisacodyl, guaiFENesin-dextromethorphan, nitroGLYCERIN, ondansetron **OR** ondansetron (ZOFRAN) IV, sodium chloride flush, sodium phosphate, SUMAtriptan Medications Prior to Admission:  Prior to Admission medications   Medication Sig Start Date End Date Taking? Authorizing Provider  acetaminophen (TYLENOL) 325 MG tablet Take 650 mg by mouth 2 (two) times daily.    Yes [provider]  albuterol (PROVENTIL HFA;VENTOLIN HFA) 108 (90 Base) MCG/ACT inhaler Inhale 2 puffs into the lungs every 6 (six) hours as needed for wheezing or shortness of breath. 10/08/16  Yes Karamalegos, Devonne Doughty, DO  apixaban (ELIQUIS) 5 MG TABS tablet Take 5 mg by mouth every 12 (twelve) hours.   Yes [provider]  atorvastatin (LIPITOR) 80 MG tablet Take 1 tablet (80 mg total) by mouth daily at 6 PM. 09/24/16  Yes Karamalegos, Devonne Doughty, DO  diclofenac sodium (VOLTAREN) 1 % GEL Apply 2 g topically 4 (four)  times daily. 01/11/16  Yes Daymon Larsen, MD  DULoxetine (CYMBALTA) 60 MG capsule Take 1 capsule (60 mg total) by mouth at bedtime. 09/24/16  Yes Karamalegos, Devonne Doughty, DO  ferrous sulfate 325 (65 FE) MG EC  tablet Take 1 tablet (325 mg total) by mouth 2 (two) times daily with a meal. 02/21/17  Yes Sudini, Srikar, MD  fluticasone (FLONASE) 50 MCG/ACT nasal spray Place 1 spray into both nostrils daily. 10/08/16  Yes Karamalegos, Alexander J, DO  fluticasone (FLOVENT HFA) 220 MCG/ACT inhaler Inhale 1 puff into the lungs 2 (two) times daily. 04/02/17  Yes Wieting, Richard, MD  furosemide (LASIX) 20 MG tablet Take 10 mg by mouth daily.    Yes Karamalegos, Devonne Doughty, DO  gabapentin (NEURONTIN) 800 MG tablet Take 1 tablet (800 mg total) by mouth 2 (two) times daily. 09/24/16  Yes Karamalegos, Devonne Doughty, DO  glipiZIDE-metformin (METAGLIP) 5-500 MG tablet Take 1 tablet by mouth 2 (two) times daily before a meal. 04/02/17  Yes Karamalegos, Devonne Doughty, DO  levETIRAcetam (KEPPRA) 500 MG tablet Take 1 tablet (500 mg total) by mouth 2 (two) times daily. 09/19/15  Yes Epifanio Lesches, MD  levothyroxine (SYNTHROID, LEVOTHROID) 175 MCG tablet Take 1 tablet (175 mcg total) by mouth daily before breakfast. 09/24/16  Yes Karamalegos, Devonne Doughty, DO  lisinopril (PRINIVIL,ZESTRIL) 20 MG tablet Take 1 tablet (20 mg total) by mouth daily. 10/08/16  Yes Karamalegos, Devonne Doughty, DO  nitroGLYCERIN (NITROSTAT) 0.4 MG SL tablet Place 1 tablet (0.4 mg total) under the tongue every 5 (five) minutes as needed for chest pain. 02/28/16  Yes Mody, Ulice Bold, MD  omeprazole (PRILOSEC) 20 MG capsule Take 20 mg by mouth 2 (two) times daily.   Yes [provider]  potassium chloride (MICRO-K) 10 MEQ CR capsule Take 1 capsule (10 mEq total) by mouth daily. 09/24/16  Yes Karamalegos, Devonne Doughty, DO  predniSONE (DELTASONE) 10 MG tablet 3 tabs po day1; 2 tabs po day2; 1 tab po day 3; 1/2 tab day 4,5 04/02/17  Yes Wieting, Richard, MD  sucralfate (CARAFATE) 1 g tablet TAKE (1) TABLET BY MOUTH FOUR TIMES A DAY 03/26/17  Yes Karamalegos, Devonne Doughty, DO  SUMAtriptan (IMITREX) 25 MG tablet Take 1 tablet (25 mg total) by mouth once as needed for up  to 1 dose for migraine. May repeat one dose in 2 hours if persists, max in 24 hrs 02/13/17  Yes Karamalegos, Devonne Doughty, DO  umeclidinium bromide (INCRUSE ELLIPTA) 62.5 MCG/INH AEPB Inhale 1 puff into the lungs daily. 11/13/15  Yes Karamalegos, Devonne Doughty, DO  guaiFENesin-dextromethorphan (ROBITUSSIN DM) 100-10 MG/5ML syrup Take 5 mLs by mouth every 4 (four) hours as needed for cough. 04/07/17   Demetrios Loll, MD  predniSONE (DELTASONE) 10 MG tablet 40 mg po daily 1 day, 30 mg po daily 1 day,20 mg po daily 1 day, 10 mg po daily 1 day. 04/08/17   Demetrios Loll, MD   Allergies  Allergen Reactions  . Mushroom Extract Complex Anaphylaxis    Made patient "deathly sick"  . Atenolol     "MD took me off of it bc it was doing something wrong" Made patient HYPOTENSIVE  . Ivp Dye [Iodinated Diagnostic Agents] Itching    Pt injected with IV contrast.  5 min after injection pt complained of itching behind ear and on her abd.  . Morphine And Related Other (See Comments)    "stops my breathing" NEAR-RESPIRATORY ARREST  . Percocet [Oxycodone-Acetaminophen] Nausea And Vomiting and Other (  See Comments)    Stomach pains  . Percodan [Oxycodone-Aspirin] Nausea And Vomiting and Other (See Comments)    Stomach pains  . Verapamil Other (See Comments)    " MD told me this was screwing me up" MAKES PATIENT HYPOTENSIVE   Review of Systems  Constitutional: Positive for activity change.  Respiratory: Positive for cough.   Musculoskeletal:       Generalized weakness   Neurological: Positive for weakness.       Occasional expressive aphasia     Physical Exam  Constitutional: She is oriented to person, place, and time. She appears well-developed. She is cooperative.  Cardiovascular: Normal rate and regular rhythm.  Pulmonary/Chest:  Cough and  congestion  Musculoskeletal:  Generalized weakness   Neurological: She is alert and oriented to person, place, and time.  Occasional expressive aphasia   Nursing note and  vitals reviewed.   Vital Signs: BP 127/90   Pulse (!) 56   Temp 97.7 F (36.5 C) (Oral)   Resp 16   Ht _0  (1.549 m)   Wt 80.9 kg (178 lb 6.4 oz)   SpO2 97%   BMI 33.71 kg/m  Pain Scale: 0-10   Pain Score: 0-No pain   SpO2: SpO2: 97 % O2 Device:SpO2: 97 % O2 Flow Rate: .O2 Flow Rate (L/min): 2 L/min  IO: Intake/output summary:   Intake/Output Summary (Last 24 hours) at 04/07/2017 1627 Last data filed at 04/07/2017 1438 Gross per 24 hour  Intake 720 ml  Output 0 ml  Net 720 ml    LBM: Last BM Date: 04/03/17 Baseline Weight: Weight: 87.1 kg (192 lb) Most recent weight: Weight: 80.9 kg (178 lb 6.4 oz)     Palliative Assessment/Data:PPS 40%     Time In: 1100 Time Out: 1215 Time Total: 75 min.   Greater than 50%  of this time was spent counseling and coordinating care related to the above assessment and plan.  Signed by: Alda Lea, NP-BC Palliative Medicine Team  Phone: [phone] Fax: [phone]   Vinie Sill, NP Palliative Medicine Team Pager # [phone] (M-F 8a-5p) Team Phone # [phone] (Nights/Weekends)   Please contact Palliative Medicine Team phone at [phone] for questions and concerns.  For individual provider: See Shea Evans

## 2017-04-07 NOTE — Plan of Care (Signed)
Orienting her to her limitations with her movement secondary to left side weakness and poor balance

## 2017-04-07 NOTE — [place] Notes (Signed)
CBG tested at 377.  This is much different than the CBG's that she typically has.  Immediate repeat CBG was 241. I treated with 5 units Novolog for the CBG of 241

## 2017-04-07 NOTE — Progress Notes (Signed)
Samantha Aguirre knocked and heard no answer. Patient was asleep. Samantha Aguirre will try again later.

## 2017-04-07 NOTE — Care Management (Signed)
Notified [facility] that anticipate discharge to skilled nursing facility- [facility].  Patient informed CM that she does live by herself and has neighbors in the apartments around her on [address].  There is one emergency pull in the apartment

## 2017-04-07 NOTE — NC FL2 (Signed)
[place] LEVEL OF CARE SCREENING TOOL     IDENTIFICATION  Patient Name: Samantha Aguirre Birthdate: 02/09/1945 Sex: female Admission Date (Current Location): 04/03/2017  [place] and [place] Number:  Selena Lesser [phone] [place] and Address:  [facility], [address] [place], [place] [address]      Provider Number: [id number]  Attending Physician Name and Address:  Demetrios Loll, MD  Relative Name and Phone Number:  Wade,Vaudine [phone]    Current Level of Care: Hospital Recommended Level of Care: [place] Prior Approval Number:    Date Approved/Denied:   PASRR Number: [id number]  Discharge Plan: SNF    Current Diagnoses: Patient Active Problem List   Diagnosis Date Noted  . COPD exacerbation ([place]) 04/03/2017  . Flu 03/27/2017  . Iron deficiency anemia due to chronic blood loss   . Migraine 02/13/2017  . Cataracts, bilateral 01/15/2017  . Chronic venous insufficiency 10/06/2016  . Recurrent major depressive disorder, in partial remission ([place]) 10/06/2016  . Left-sided weakness 10/03/2016  . Chest pain 02/27/2016  . History of cerebrovascular accident (CVA) with residual deficit 02/19/2016  . Thumb pain, left 01/22/2016  . Traumatic closed nondisplaced fracture of base of metacarpal bone of left thumb 01/22/2016  . Colon cancer screening 11/27/2015  . Depression 11/14/2015  . Coronary artery disease 11/13/2015  . Hypertension 11/13/2015  . History of skin cancer 11/13/2015  . Osteoporosis 11/13/2015  . Chronic low back pain 11/13/2015  . Left medial knee pain 11/13/2015  . Osteoarthritis of multiple joints 11/13/2015  . Other specified hypothyroidism 08/16/2015  . Controlled type 2 diabetes mellitus with diabetic neuropathy ([place]) 08/16/2015  . COPD (chronic obstructive pulmonary disease) ([place]) 08/16/2015  . Tobacco abuse 08/16/2015  . HLD (hyperlipidemia) 08/16/2015  . History of CHF  (congestive heart failure) 08/16/2015  . Seizures (HCC)     Orientation RESPIRATION BLADDER Height & Weight     Self, Time, Situation, Place  Normal Continent Weight: 178 lb 6.4 oz (80.9 kg) Height:  5\' 1"  (154.9 cm)  BEHAVIORAL SYMPTOMS/MOOD NEUROLOGICAL BOWEL NUTRITION STATUS      Continent Diet(Carb modified)  AMBULATORY STATUS COMMUNICATION OF NEEDS Skin   Limited Assist Verbally Normal                       Personal Care Assistance Level of Assistance  Bathing, Feeding, Dressing Bathing Assistance: Limited assistance Feeding assistance: Independent Dressing Assistance: Limited assistance     Functional Limitations Info  Sight, Hearing, Speech Sight Info: Adequate Hearing Info: Adequate Speech Info: Adequate    SPECIAL CARE FACTORS FREQUENCY  PT (By licensed PT)     PT Frequency: 5x a week              Contractures Contractures Info: Not present    Additional Factors Info  Code Status, Allergies, Insulin Sliding Scale Code Status Info: DNR Allergies Info: MUSHROOM EXTRACT COMPLEX, ATENOLOL, IVP DYE IODINATED DIAGNOSTIC AGENTS, MORPHINE AND RELATED, PERCOCET OXYCODONE-ACETAMINOPHEN, PERCODAN OXYCODONE-ASPIRIN, VERAPAMIL    Insulin Sliding Scale Info: insulin aspart (novoLOG) injection 0-15 Units 3x a day with meals       Current Medications (04/07/2017):  This is the current hospital active medication list Current Facility-Administered Medications  Medication Dose Route Frequency Provider Last Rate Last Dose  .  stroke: mapping our early stages of recovery book   Does not apply Once Lance Coon, MD      . 0.9 %  sodium chloride infusion  250 mL  Intravenous PRN Demetrios Loll, MD      . albuterol (PROVENTIL) (2.5 MG/3ML) 0.083% nebulizer solution 2.5 mg  2.5 mg Nebulization Q2H PRN Demetrios Loll, MD      . atorvastatin (LIPITOR) tablet 80 mg  80 mg Oral q1800 Demetrios Loll, MD   80 mg at 04/06/17 1752  . bisacodyl (DULCOLAX) suppository 10 mg  10 mg Rectal Daily  PRN Demetrios Loll, MD   10 mg at 04/06/17 1230  . budesonide (PULMICORT) nebulizer solution 0.5 mg  2 mL Inhalation BID Demetrios Loll, MD   0.5 mg at 04/07/17 0758  . docusate sodium (COLACE) capsule 100 mg  100 mg Oral BID Demetrios Loll, MD   100 mg at 04/07/17 0086  . DULoxetine (CYMBALTA) DR capsule 60 mg  60 mg Oral QHS Demetrios Loll, MD   60 mg at 04/06/17 2205  . ferrous sulfate tablet 325 mg  325 mg Oral BID WC Demetrios Loll, MD   325 mg at 04/07/17 7619  . gabapentin (NEURONTIN) capsule 800 mg  800 mg Oral BID Demetrios Loll, MD   800 mg at 04/07/17 5093  . guaiFENesin-dextromethorphan (ROBITUSSIN DM) 100-10 MG/5ML syrup 5 mL  5 mL Oral Q4H PRN Demetrios Loll, MD      . insulin aspart (novoLOG) injection 0-15 Units  0-15 Units Subcutaneous TID WC Demetrios Loll, MD   5 Units at 04/07/17 1131  . insulin aspart (novoLOG) injection 0-5 Units  0-5 Units Subcutaneous QHS Demetrios Loll, MD   2 Units at 04/04/17 2206  . insulin glargine (LANTUS) injection 10 Units  10 Units Subcutaneous QHS Demetrios Loll, MD   10 Units at 04/06/17 2205  . levETIRAcetam (KEPPRA) tablet 500 mg  500 mg Oral BID Demetrios Loll, MD   500 mg at 04/07/17 2671  . levothyroxine (SYNTHROID, LEVOTHROID) tablet 175 mcg  175 mcg Oral QAC breakfast Demetrios Loll, MD   175 mcg at 04/07/17 [phone]  . midazolam (VERSED) injection 2 mg  2 mg Intravenous Once Pyreddy, Reatha Harps, MD      . nitroGLYCERIN (NITROSTAT) SL tablet 0.4 mg  0.4 mg Sublingual Q5 min PRN Demetrios Loll, MD      . ondansetron [facility]) tablet 4 mg  4 mg Oral Q6H PRN Demetrios Loll, MD       Or  . ondansetron [facility]) injection 4 mg  4 mg Intravenous Q6H PRN Demetrios Loll, MD      . pantoprazole (PROTONIX) EC tablet 40 mg  40 mg Oral Daily Demetrios Loll, MD   40 mg at 04/07/17 0998  . potassium chloride (K-DUR,KLOR-CON) CR tablet 10 mEq  10 mEq Oral Daily Demetrios Loll, MD   10 mEq at 04/07/17 [phone]  . predniSONE (DELTASONE) tablet 50 mg  50 mg Oral Q breakfast Demetrios Loll, MD   50 mg at 04/07/17 5053  . sodium chloride flush  (NS) 0.9 % injection 3 mL  3 mL Intravenous Q12H Demetrios Loll, MD   3 mL at 04/07/17 0813  . sodium chloride flush (NS) 0.9 % injection 3 mL  3 mL Intravenous PRN Demetrios Loll, MD   3 mL at 04/04/17 0513  . sodium phosphate (FLEET) 7-19 GM/118ML enema 1 enema  1 enema Rectal Daily PRN Demetrios Loll, MD      . sucralfate (CARAFATE) tablet 1 g  1 g Oral TID WC & HS Demetrios Loll, MD   1 g at 04/07/17 1131  . SUMAtriptan (IMITREX) tablet 25 mg  25 mg Oral Once PRN  Demetrios Loll, MD      . umeclidinium bromide (INCRUSE ELLIPTA) 62.5 MCG/INH 1 puff  1 puff Inhalation Daily Demetrios Loll, MD   1 puff at 04/07/17 0810     Discharge Medications: Please see discharge summary for a list of discharge medications.  Relevant Imaging Results:  Relevant Lab Results:   Additional Information SSN [id number]  Ross Ludwig, [place]

## 2017-04-08 DIAGNOSIS — J984 Other disorders of lung: Secondary | ICD-10-CM | POA: Diagnosis not present

## 2017-04-08 DIAGNOSIS — Z720 Tobacco use: Secondary | ICD-10-CM | POA: Diagnosis not present

## 2017-04-08 DIAGNOSIS — E119 Type 2 diabetes mellitus without complications: Secondary | ICD-10-CM | POA: Diagnosis not present

## 2017-04-08 DIAGNOSIS — I5032 Chronic diastolic (congestive) heart failure: Secondary | ICD-10-CM | POA: Diagnosis not present

## 2017-04-08 DIAGNOSIS — I639 Cerebral infarction, unspecified: Secondary | ICD-10-CM | POA: Diagnosis not present

## 2017-04-08 DIAGNOSIS — J9601 Acute respiratory failure with hypoxia: Secondary | ICD-10-CM | POA: Diagnosis not present

## 2017-04-08 DIAGNOSIS — F332 Major depressive disorder, recurrent severe without psychotic features: Secondary | ICD-10-CM | POA: Diagnosis not present

## 2017-04-08 DIAGNOSIS — R41841 Cognitive communication deficit: Secondary | ICD-10-CM | POA: Diagnosis not present

## 2017-04-08 DIAGNOSIS — R5381 Other malaise: Secondary | ICD-10-CM | POA: Diagnosis not present

## 2017-04-08 DIAGNOSIS — D509 Iron deficiency anemia, unspecified: Secondary | ICD-10-CM | POA: Diagnosis not present

## 2017-04-08 DIAGNOSIS — I5189 Other ill-defined heart diseases: Secondary | ICD-10-CM | POA: Diagnosis not present

## 2017-04-08 DIAGNOSIS — I69320 Aphasia following cerebral infarction: Secondary | ICD-10-CM | POA: Diagnosis not present

## 2017-04-08 DIAGNOSIS — M6281 Muscle weakness (generalized): Secondary | ICD-10-CM | POA: Diagnosis not present

## 2017-04-08 DIAGNOSIS — I6389 Other cerebral infarction: Secondary | ICD-10-CM | POA: Diagnosis not present

## 2017-04-08 DIAGNOSIS — R6889 Other general symptoms and signs: Secondary | ICD-10-CM | POA: Diagnosis not present

## 2017-04-08 DIAGNOSIS — I480 Paroxysmal atrial fibrillation: Secondary | ICD-10-CM | POA: Diagnosis not present

## 2017-04-08 DIAGNOSIS — J441 Chronic obstructive pulmonary disease with (acute) exacerbation: Secondary | ICD-10-CM | POA: Diagnosis not present

## 2017-04-08 DIAGNOSIS — I482 Chronic atrial fibrillation: Secondary | ICD-10-CM | POA: Diagnosis not present

## 2017-04-08 LAB — BLOOD GAS, VENOUS
Patient temperature: 37
pCO2, Ven: 42 mmHg — ABNORMAL LOW (ref 44.0–60.0)
pH, Ven: 7.41 (ref 7.250–7.430)

## 2017-04-08 LAB — GLUCOSE, CAPILLARY
GLUCOSE-CAPILLARY: 183 mg/dL — AB (ref 65–99)
GLUCOSE-CAPILLARY: 306 mg/dL — AB (ref 65–99)
Glucose-Capillary: 142 mg/dL — ABNORMAL HIGH (ref 65–99)

## 2017-04-08 NOTE — [place] Notes (Signed)
New referral for out patient PALLIATIVE to follow at [facility] received from [employer]. Plan is for discharge today. Patient information faxed to referral. Flo Shanks RN, BSN, [facility], hospital liaison [phone]

## 2017-04-08 NOTE — Discharge Summary (Addendum)
[facility] [place] NAME: Samantha Aguirre    MR#:  [id number]  DATE OF BIRTH:  May 29, 1945  DATE OF ADMISSION:  04/03/2017   ADMITTING PHYSICIAN: Demetrios Loll, MD  DATE OF DISCHARGE:  04/08/2017  PRIMARY CARE PHYSICIAN: [facility]   ADMISSION DIAGNOSIS:  Respiratory distress [R06.03] COPD exacerbation (HCC) [J44.1] DISCHARGE DIAGNOSIS:  Active Problems:   COPD exacerbation (HCC)   Stroke (cerebrum) (HCC)   Goals of care, counseling/discussion   Palliative care encounter  SECONDARY DIAGNOSIS:   Past Medical History:  Diagnosis Date  . Acid reflux   . Arthritis    Bilateral knees, hands, feet  . Asthma   . Atrial fibrillation ([place])   . Cancer ([place])   . Chest pain   . CHF (congestive heart failure) (HCC)    Diastolic CHF  . COPD (chronic obstructive pulmonary disease) ([place])   . Coronary artery disease   . Diabetes mellitus without complication ([place])   . Frequent headaches   . Heart attack ([place])   . High cholesterol   . HLD (hyperlipidemia)   . Hypertension   . MI (myocardial infarction) ([place])   . Seizures ([place])   . Skin cancer 2015   Suspected basal cell carcinoma on nose, surgically removed  . Stroke ([place])   . Thyroid disease   . Tremors of nervous system    HOSPITAL COURSE:  Samantha Aguirre  is a 72 y.o. female with a known history of multiple medical problems as below.  Patient was just discharged to home after treatment of COPD exacerbation yesterday.  She feels worsening shortness of breath.   Acute respiratory failure with hypoxia due to COPD exacerbation. tapered IV Solu-Medrol and changed to prednisone taper, albuterol as needed and home nebulizer. Weaned off oxygen.  Chronic diastolic CHF, LV EF: 51% - 65%. Stable, continue Lasix.  Mild leukocytosis, due to steroid.  Chronic A. fib. Continue Eliquis.  Hypertension.  Continue home hypertension medication.  Hyperglycemia with  diabetes.  Due to steroid.  Blood glucose is better controlled. Continue Lantus and continue sliding scale.  Tobacco abuse. Smoking cessation was counseled for 4 minutes.  Acute CVA and History of recurrent CVA.  She had facial droop, slurred speech and left side weakness, found to have acute CVA during PT Continue Eliquis. PT OT evaluation suggest SNF.  Speech study suggest dysphagia 3 diet. Aspiration the fall precaution.  Palliative care follow-up in facility. DISCHARGE CONDITIONS:  Stable, discharge to SNF today. CONSULTS OBTAINED:  Treatment Team:  Alexis Goodell, MD DRUG ALLERGIES:   Allergies  Allergen Reactions  . Mushroom Extract Complex Anaphylaxis    Made patient "deathly sick"  . Atenolol     "MD took me off of it bc it was doing something wrong" Made patient HYPOTENSIVE  . Ivp Dye [Iodinated Diagnostic Agents] Itching    Pt injected with IV contrast.  5 min after injection pt complained of itching behind ear and on her abd.  . Morphine And Related Other (See Comments)    "stops my breathing" NEAR-RESPIRATORY ARREST  . Percocet [Oxycodone-Acetaminophen] Nausea And Vomiting and Other (See Comments)    Stomach pains  . Percodan [Oxycodone-Aspirin] Nausea And Vomiting and Other (See Comments)    Stomach pains  . Verapamil Other (See Comments)    " MD told me this was screwing me up" MAKES PATIENT HYPOTENSIVE   DISCHARGE MEDICATIONS:   Allergies as of 04/08/2017      Reactions  Mushroom Extract Complex Anaphylaxis   Made patient "deathly sick"   Atenolol    "MD took me off of it bc it was doing something wrong" Made patient HYPOTENSIVE   Ivp Dye [iodinated Diagnostic Agents] Itching   Pt injected with IV contrast.  5 min after injection pt complained of itching behind ear and on her abd.   Morphine And Related Other (See Comments)   "stops my breathing" NEAR-RESPIRATORY ARREST   Percocet [oxycodone-acetaminophen] Nausea And Vomiting, Other (See  Comments)   Stomach pains   Percodan [oxycodone-aspirin] Nausea And Vomiting, Other (See Comments)   Stomach pains   Verapamil Other (See Comments)   " MD told me this was screwing me up" MAKES PATIENT HYPOTENSIVE      Medication List    TAKE these medications   albuterol 108 (90 Base) MCG/ACT inhaler Commonly known as:  PROVENTIL HFA;VENTOLIN HFA Inhale 2 puffs into the lungs every 6 (six) hours as needed for wheezing or shortness of breath. Notes to patient:  None given today   apixaban 5 MG Tabs tablet Commonly known as:  ELIQUIS Take 5 mg by mouth every 12 (twelve) hours.   atorvastatin 80 MG tablet Commonly known as:  LIPITOR Take 1 tablet (80 mg total) by mouth daily at 6 PM.   diclofenac sodium 1 % Gel Commonly known as:  VOLTAREN Apply 2 g topically 4 (four) times daily. Notes to patient:  None today   DULoxetine 60 MG capsule Commonly known as:  CYMBALTA Take 1 capsule (60 mg total) by mouth at bedtime.   ferrous sulfate 325 (65 FE) MG EC tablet Take 1 tablet (325 mg total) by mouth 2 (two) times daily with a meal.   fluticasone 220 MCG/ACT inhaler Commonly known as:  FLOVENT HFA Inhale 1 puff into the lungs 2 (two) times daily.   fluticasone 50 MCG/ACT nasal spray Commonly known as:  FLONASE Place 1 spray into both nostrils daily.   furosemide 20 MG tablet Commonly known as:  LASIX Take 10 mg by mouth daily.   gabapentin 800 MG tablet Commonly known as:  NEURONTIN Take 1 tablet (800 mg total) by mouth 2 (two) times daily.   glipiZIDE-metformin 5-500 MG tablet Commonly known as:  METAGLIP Take 1 tablet by mouth 2 (two) times daily before a meal.   guaiFENesin-dextromethorphan 100-10 MG/5ML syrup Commonly known as:  ROBITUSSIN DM Take 5 mLs by mouth every 4 (four) hours as needed for cough.   levETIRAcetam 500 MG tablet Commonly known as:  KEPPRA Take 1 tablet (500 mg total) by mouth 2 (two) times daily.   levothyroxine 175 MCG tablet Commonly  known as:  SYNTHROID, LEVOTHROID Take 1 tablet (175 mcg total) by mouth daily before breakfast.   lisinopril 20 MG tablet Commonly known as:  PRINIVIL,ZESTRIL Take 1 tablet (20 mg total) by mouth daily.   nitroGLYCERIN 0.4 MG SL tablet Commonly known as:  NITROSTAT Place 1 tablet (0.4 mg total) under the tongue every 5 (five) minutes as needed for chest pain.   omeprazole 20 MG capsule Commonly known as:  PRILOSEC Take 20 mg by mouth 2 (two) times daily.   potassium chloride 10 MEQ CR capsule Commonly known as:  MICRO-K Take 1 capsule (10 mEq total) by mouth daily.   predniSONE 10 MG tablet Commonly known as:  DELTASONE 40 mg po daily 1 day, 30 mg po daily 1 day,20 mg po daily 1 day, 10 mg po daily 1 day. What changed:  additional  instructions   sucralfate 1 g tablet Commonly known as:  CARAFATE TAKE (1) TABLET BY MOUTH FOUR TIMES A DAY   SUMAtriptan 25 MG tablet Commonly known as:  IMITREX Take 1 tablet (25 mg total) by mouth once as needed for up to 1 dose for migraine. May repeat one dose in 2 hours if persists, max in 24 hrs Notes to patient:  None today   TYLENOL 325 MG tablet Generic drug:  acetaminophen Take 650 mg by mouth 2 (two) times daily.   umeclidinium bromide 62.5 MCG/INH Aepb Commonly known as:  INCRUSE ELLIPTA Inhale 1 puff into the lungs daily.        DISCHARGE INSTRUCTIONS:  See AVS.  If you experience worsening of your admission symptoms, develop shortness of breath, life threatening emergency, suicidal or homicidal thoughts you must seek medical attention immediately by calling 911 or calling your MD immediately  if symptoms less severe.  You Must read complete instructions/literature along with all the possible adverse reactions/side effects for all the Medicines you take and that have been prescribed to you. Take any new Medicines after you have completely understood and accpet all the possible adverse reactions/side effects.   Please  note  You were cared for by a hospitalist during your hospital stay. If you have any questions about your discharge medications or the care you received while you were in the hospital after you are discharged, you can call the unit and asked to speak with the hospitalist on call if the hospitalist that took care of you is not available. Once you are discharged, your primary care physician will handle any further medical issues. Please note that NO REFILLS for any discharge medications will be authorized once you are discharged, as it is imperative that you return to your primary care physician (or establish a relationship with a primary care physician if you do not have one) for your aftercare needs so that they can reassess your need for medications and monitor your lab values.    On the day of Discharge:  VITAL SIGNS:  Blood pressure (!) 127/57, pulse (!) 57, temperature 98.2 F (36.8 C), temperature source Oral, resp. rate 18, height 5\' 1"  (1.549 m), weight 173 lb 9.6 oz (78.7 kg), SpO2 93 %. PHYSICAL EXAMINATION:  GENERAL:  72 y.o.-year-old patient lying in the bed with no acute distress.  EYES: Pupils equal, round, reactive to light and accommodation. No scleral icterus. Extraocular muscles intact.  HEENT: Head atraumatic, normocephalic. Oropharynx and nasopharynx clear.  NECK:  Supple, no jugular venous distention. No thyroid enlargement, no tenderness.  LUNGS: Normal breath sounds bilaterally, mild wheezing, no rales,rhonchi or crepitation. No use of accessory muscles of respiration.  CARDIOVASCULAR: S1, S2 normal. No murmurs, rubs, or gallops.  ABDOMEN: Soft, non-tender, non-distended. Bowel sounds present. No organomegaly or mass.  EXTREMITIES: No pedal edema, cyanosis, or clubbing.  NEUROLOGIC: Cranial nerves II through XII are intact. Muscle strength 5/5 in right but 4/5 in extremities. Sensation intact. Gait not checked.  PSYCHIATRIC: The patient is alert and oriented x 3.  SKIN: No  obvious rash, lesion, or ulcer.  DATA REVIEW:   CBC Recent Labs  Lab 04/07/17 0429  WBC 8.5  HGB 11.9*  HCT 36.3  PLT 262    Chemistries  Recent Labs  Lab 04/03/17 1612 04/04/17 0544 04/07/17 0429  NA  --  140  --   K  --  4.6  --   CL  --  105  --  CO2  --  25  --   GLUCOSE  --  285*  --   BUN  --  17  --   CREATININE  --  0.67 0.65  CALCIUM  --  8.9  --   MG 1.9  --   --      Microbiology Results  Results for orders placed or performed during the hospital encounter of 03/27/17  Culture, blood (routine x 2)     Status: None   Collection Time: 03/27/17 12:44 PM  Result Value Ref Range Status   Specimen Description BLOOD LAC  Final   Special Requests   Final    BOTTLES DRAWN AEROBIC AND ANAEROBIC Blood Culture adequate volume   Culture   Final    NO GROWTH 5 DAYS Performed at [facility], [address], [place], [place] [address]    Report Status 04/01/2017 FINAL  Final  Culture, blood (routine x 2)     Status: None   Collection Time: 03/27/17 12:44 PM  Result Value Ref Range Status   Specimen Description BLOOD RAC  Final   Special Requests   Final    BOTTLES DRAWN AEROBIC AND ANAEROBIC Blood Culture adequate volume   Culture   Final    NO GROWTH 5 DAYS Performed at [facility], [address] [place], [place] [address]    Report Status 04/01/2017 FINAL  Final    RADIOLOGY:  No results found.   Management plans discussed with the patient, family and they are in agreement.  CODE STATUS: DNR   TOTAL TIME TAKING CARE OF THIS PATIENT: 32 minutes.    Demetrios Loll M.D on 04/08/2017 at 9:16 AM  Between 7am to 6pm - Pager - [phone]  After 6pm go to [email or web address] - Proofreader  [facility]  Office  [phone]  CC: Primary care physician; Katheren Shams   Note: This dictation was prepared with Dragon dictation along with smaller phrase technology. Any transcriptional errors that  result from this process are unintentional.

## 2017-04-08 NOTE — Progress Notes (Signed)
Report called to [facility] at [facility], EMS called.

## 2017-04-08 NOTE — Discharge Instructions (Signed)
Dysphagia 3 ([place])  Aspiration and fall precaution. Smoking cessation.  recommend outpatient Palliative services to be of extra support and evaluate patient's status over time.   Palliative care follow-up in facility.

## 2017-04-08 NOTE — Progress Notes (Signed)
[facility] at [facility] NAME: Samantha Aguirre    MR#:  [id number]  DATE OF BIRTH:  [birth year]  DATE OF ADMISSION:  04/03/2017   ADMITTING PHYSICIAN: Demetrios Loll, MD  DATE OF DISCHARGE:  04/08/2017  PRIMARY CARE PHYSICIAN: [facility]   ADMISSION DIAGNOSIS:  Respiratory distress [R06.03] COPD exacerbation (HCC) [J44.1] DISCHARGE DIAGNOSIS:  Active Problems:   COPD exacerbation (HCC)   Stroke (cerebrum) (HCC)   Goals of care, counseling/discussion   Palliative care encounter  SECONDARY DIAGNOSIS:   Past Medical History:  Diagnosis Date  . Acid reflux   . Arthritis    Bilateral knees, hands, feet  . Asthma   . Atrial fibrillation ([place])   . Cancer ([place])   . Chest pain   . CHF (congestive heart failure) (HCC)    Diastolic CHF  . COPD (chronic obstructive pulmonary disease) ([place])   . Coronary artery disease   . Diabetes mellitus without complication ([place])   . Frequent headaches   . Heart attack ([place])   . High cholesterol   . HLD (hyperlipidemia)   . Hypertension   . MI (myocardial infarction) ([place])   . Seizures ([place])   . Skin cancer 2015   Suspected basal cell carcinoma on nose, surgically removed  . Stroke ([place])   . Thyroid disease   . Tremors of nervous system    HOSPITAL COURSE:  Samantha Aguirre  is a 72 y.o. female with a known history of multiple medical problems as below.  Patient was just discharged to home after treatment of COPD exacerbation yesterday.  She feels worsening shortness of breath.  Acute respiratory failure with hypoxia due to COPD exacerbation. tapered IV Solu-Medrol and changed to prednisone taper, albuterol as needed and home nebulizer. Weaned off oxygen.  Chronic diastolic CHF, LV EF: 19% - 65%. Stable, continue Lasix.  Mild leukocytosis, due to steroid.  Chronic A. fib. Continue Eliquis.  Hypertension.  Continue home hypertension medication.  Hyperglycemia with diabetes.   Due to steroid.  Blood glucose is better controlled. Continue Lantus and continue sliding scale.  Tobacco abuse. Smoking cessation was counseled for 4 minutes.  Acute CVA and History of recurrent CVA.  She had facial droop, slurred speech and left side weakness, found to have acute CVA during PT Continue Eliquis. PT OT evaluation suggest SNF.  Speech study suggest dysphagia 3 diet. Aspiration the fall precaution. DISCHARGE CONDITIONS:  Stable, discharge to SNF today. CONSULTS OBTAINED:  Treatment Team:  Alexis Goodell, MD DRUG ALLERGIES:   Allergies  Allergen Reactions  . Mushroom Extract Complex Anaphylaxis    Made patient "deathly sick"  . Atenolol     "MD took me off of it bc it was doing something wrong" Made patient HYPOTENSIVE  . Ivp Dye [Iodinated Diagnostic Agents] Itching    Pt injected with IV contrast.  5 min after injection pt complained of itching behind ear and on her abd.  . Morphine And Related Other (See Comments)    "stops my breathing" NEAR-RESPIRATORY ARREST  . Percocet [Oxycodone-Acetaminophen] Nausea And Vomiting and Other (See Comments)    Stomach pains  . Percodan [Oxycodone-Aspirin] Nausea And Vomiting and Other (See Comments)    Stomach pains  . Verapamil Other (See Comments)    " MD told me this was screwing me up" MAKES PATIENT HYPOTENSIVE   DISCHARGE MEDICATIONS:   Allergies as of 04/08/2017      Reactions   Mushroom Extract Complex Anaphylaxis  Made patient "deathly sick"   Atenolol    "MD took me off of it bc it was doing something wrong" Made patient HYPOTENSIVE   Ivp Dye [iodinated Diagnostic Agents] Itching   Pt injected with IV contrast.  5 min after injection pt complained of itching behind ear and on her abd.   Morphine And Related Other (See Comments)   "stops my breathing" NEAR-RESPIRATORY ARREST   Percocet [oxycodone-acetaminophen] Nausea And Vomiting, Other (See Comments)   Stomach pains   Percodan  [oxycodone-aspirin] Nausea And Vomiting, Other (See Comments)   Stomach pains   Verapamil Other (See Comments)   " MD told me this was screwing me up" MAKES PATIENT HYPOTENSIVE      Medication List    TAKE these medications   albuterol 108 (90 Base) MCG/ACT inhaler Commonly known as:  PROVENTIL HFA;VENTOLIN HFA Inhale 2 puffs into the lungs every 6 (six) hours as needed for wheezing or shortness of breath. Notes to patient:  None given today   apixaban 5 MG Tabs tablet Commonly known as:  ELIQUIS Take 5 mg by mouth every 12 (twelve) hours.   atorvastatin 80 MG tablet Commonly known as:  LIPITOR Take 1 tablet (80 mg total) by mouth daily at 6 PM.   diclofenac sodium 1 % Gel Commonly known as:  VOLTAREN Apply 2 g topically 4 (four) times daily. Notes to patient:  None today   DULoxetine 60 MG capsule Commonly known as:  CYMBALTA Take 1 capsule (60 mg total) by mouth at bedtime.   ferrous sulfate 325 (65 FE) MG EC tablet Take 1 tablet (325 mg total) by mouth 2 (two) times daily with a meal.   fluticasone 220 MCG/ACT inhaler Commonly known as:  FLOVENT HFA Inhale 1 puff into the lungs 2 (two) times daily.   fluticasone 50 MCG/ACT nasal spray Commonly known as:  FLONASE Place 1 spray into both nostrils daily.   furosemide 20 MG tablet Commonly known as:  LASIX Take 10 mg by mouth daily.   gabapentin 800 MG tablet Commonly known as:  NEURONTIN Take 1 tablet (800 mg total) by mouth 2 (two) times daily.   glipiZIDE-metformin 5-500 MG tablet Commonly known as:  METAGLIP Take 1 tablet by mouth 2 (two) times daily before a meal.   guaiFENesin-dextromethorphan 100-10 MG/5ML syrup Commonly known as:  ROBITUSSIN DM Take 5 mLs by mouth every 4 (four) hours as needed for cough.   levETIRAcetam 500 MG tablet Commonly known as:  KEPPRA Take 1 tablet (500 mg total) by mouth 2 (two) times daily.   levothyroxine 175 MCG tablet Commonly known as:  SYNTHROID, LEVOTHROID Take  1 tablet (175 mcg total) by mouth daily before breakfast.   lisinopril 20 MG tablet Commonly known as:  PRINIVIL,ZESTRIL Take 1 tablet (20 mg total) by mouth daily.   nitroGLYCERIN 0.4 MG SL tablet Commonly known as:  NITROSTAT Place 1 tablet (0.4 mg total) under the tongue every 5 (five) minutes as needed for chest pain.   omeprazole 20 MG capsule Commonly known as:  PRILOSEC Take 20 mg by mouth 2 (two) times daily.   potassium chloride 10 MEQ CR capsule Commonly known as:  MICRO-K Take 1 capsule (10 mEq total) by mouth daily.   predniSONE 10 MG tablet Commonly known as:  DELTASONE 40 mg po daily 1 day, 30 mg po daily 1 day,20 mg po daily 1 day, 10 mg po daily 1 day. What changed:  additional instructions   sucralfate 1 g  tablet Commonly known as:  CARAFATE TAKE (1) TABLET BY MOUTH FOUR TIMES A DAY   SUMAtriptan 25 MG tablet Commonly known as:  IMITREX Take 1 tablet (25 mg total) by mouth once as needed for up to 1 dose for migraine. May repeat one dose in 2 hours if persists, max in 24 hrs Notes to patient:  None today   TYLENOL 325 MG tablet Generic drug:  acetaminophen Take 650 mg by mouth 2 (two) times daily.   umeclidinium bromide 62.5 MCG/INH Aepb Commonly known as:  INCRUSE ELLIPTA Inhale 1 puff into the lungs daily.        DISCHARGE INSTRUCTIONS:  See AVS.  If you experience worsening of your admission symptoms, develop shortness of breath, life threatening emergency, suicidal or homicidal thoughts you must seek medical attention immediately by calling 911 or calling your MD immediately  if symptoms less severe.  You Must read complete instructions/literature along with all the possible adverse reactions/side effects for all the Medicines you take and that have been prescribed to you. Take any new Medicines after you have completely understood and accpet all the possible adverse reactions/side effects.   Please note  You were cared for by a hospitalist  during your hospital stay. If you have any questions about your discharge medications or the care you received while you were in the hospital after you are discharged, you can call the unit and asked to speak with the hospitalist on call if the hospitalist that took care of you is not available. Once you are discharged, your primary care physician will handle any further medical issues. Please note that NO REFILLS for any discharge medications will be authorized once you are discharged, as it is imperative that you return to your primary care physician (or establish a relationship with a primary care physician if you do not have one) for your aftercare needs so that they can reassess your need for medications and monitor your lab values.    On the day of Discharge:  VITAL SIGNS:  Blood pressure (!) 127/57, pulse (!) 57, temperature 98.2 F (36.8 C), temperature source Oral, resp. rate 18, height 5\' 1"  (1.549 m), weight 173 lb 9.6 oz (78.7 kg), SpO2 93 %. PHYSICAL EXAMINATION:  GENERAL:  72 y.o.-year-old patient lying in the bed with no acute distress.  EYES: Pupils equal, round, reactive to light and accommodation. No scleral icterus. Extraocular muscles intact.  HEENT: Head atraumatic, normocephalic. Oropharynx and nasopharynx clear.  NECK:  Supple, no jugular venous distention. No thyroid enlargement, no tenderness.  LUNGS: Normal breath sounds bilaterally, mild wheezing, no rales,rhonchi or crepitation. No use of accessory muscles of respiration.  CARDIOVASCULAR: S1, S2 normal. No murmurs, rubs, or gallops.  ABDOMEN: Soft, non-tender, non-distended. Bowel sounds present. No organomegaly or mass.  EXTREMITIES: No pedal edema, cyanosis, or clubbing.  NEUROLOGIC: Cranial nerves II through XII are intact. Muscle strength 5/5 in right but 4/5 in extremities. Sensation intact. Gait not checked.  PSYCHIATRIC: The patient is alert and oriented x 3.  SKIN: No obvious rash, lesion, or ulcer.  DATA  REVIEW:   CBC Recent Labs  Lab 04/07/17 0429  WBC 8.5  HGB 11.9*  HCT 36.3  PLT 262    Chemistries  Recent Labs  Lab 04/03/17 1612 04/04/17 0544 04/07/17 0429  NA  --  140  --   K  --  4.6  --   CL  --  105  --   CO2  --  25  --  GLUCOSE  --  285*  --   BUN  --  17  --   CREATININE  --  0.67 0.65  CALCIUM  --  8.9  --   MG 1.9  --   --      Microbiology Results  Results for orders placed or performed during the hospital encounter of 03/27/17  Culture, blood (routine x 2)     Status: None   Collection Time: 03/27/17 12:44 PM  Result Value Ref Range Status   Specimen Description BLOOD LAC  Final   Special Requests   Final    BOTTLES DRAWN AEROBIC AND ANAEROBIC Blood Culture adequate volume   Culture   Final    NO GROWTH 5 DAYS Performed at [facility], [address], [place], [place] [address]    Report Status 04/01/2017 FINAL  Final  Culture, blood (routine x 2)     Status: None   Collection Time: 03/27/17 12:44 PM  Result Value Ref Range Status   Specimen Description BLOOD RAC  Final   Special Requests   Final    BOTTLES DRAWN AEROBIC AND ANAEROBIC Blood Culture adequate volume   Culture   Final    NO GROWTH 5 DAYS Performed at [facility], [address], [place], [place] [address]    Report Status 04/01/2017 FINAL  Final    RADIOLOGY:  No results found.   Management plans discussed with the patient, family and they are in agreement.  CODE STATUS: DNR   TOTAL TIME TAKING CARE OF THIS PATIENT: 32 minutes.    Demetrios Loll M.D on 04/08/2017 at 8:24 AM  Between 7am to 6pm - Pager - [phone]  After 6pm go to [email or web address] - Proofreader  [facility]  Office  [phone]  CC: Primary care physician; Katheren Shams   Note: This dictation was prepared with Dragon dictation along with smaller phrase technology. Any transcriptional errors that result from this process are  unintentional.

## 2017-04-08 NOTE — Clinical Social Work Note (Signed)
CSW presented bed offers to patient's daughter and she chose [facility].  CSW spoke to [facility] and they can accept patient on Wednesday morning.  CSW updated bedside nurse and physician, if patient is medically ready for discharge and orders have been received she can go to [facility] on Wednesday.  CSW to facilitate discharge planning, formal assessment to follow.  Jones Broom. [place], MSW, [place]  04/08/2017 8:28 AM

## 2017-04-08 NOTE — Progress Notes (Signed)
Pt. Discharged via EMS this evening, VSS, IV removed, telemetry monitotring discontinued all belongings in hand, no complaints at this time.

## 2017-04-08 NOTE — Progress Notes (Signed)
Patient is medically stable for D/C to [facility] today. Per Neoma Laming admissions coordinator at [facility] patient can come today to room 300. RN will call report to [facility] and arrange EMS for transport. Clinical Education officer, museum (CSW) sent D/C orders to [facility] via [place]. Patient is aware of above. CSW contacted patient's daughter Zhara and made her aware of above. Please reconsult if future social work needs arise. CSW signing off.   [employer], LCSW [phone]

## 2017-04-08 NOTE — Clinical Social Work Placement (Signed)
   CLINICAL SOCIAL WORK PLACEMENT  NOTE  Date:  04/08/2017  Patient Details  Name: Samantha Aguirre MRN: [id number] Date of Birth: 1945/10/16  Clinical Social Work is seeking post-discharge placement for this patient at the [place] level of care (*CSW will initial, date and re-position this form in  chart as items are completed):  Yes   Patient/family provided with [place] Work Department's list of facilities offering this level of care within the geographic area requested by the patient (or if unable, by the patient's family).  Yes   Patient/family informed of their freedom to choose among providers that offer the needed level of care, that participate in Medicare, Medicaid or managed care program needed by the patient, have an available bed and are willing to accept the patient.  Yes   Patient/family informed of [place]'s ownership interest in [facility] and [facility], as well as of the fact that they are under no obligation to receive care at these facilities.  PASRR submitted to EDS on       PASRR number received on       Existing PASRR number confirmed on 04/07/17     FL2 transmitted to all facilities in geographic area requested by pt/family on 04/07/17     FL2 transmitted to all facilities within larger geographic area on       Patient informed that his/her managed care company has contracts with or will negotiate with certain facilities, including the following:        Yes   Patient/family informed of bed offers received.  Patient chooses bed at [facility] )     Physician recommends and patient chooses bed at      Patient to be transferred to [facility] ) on 04/08/17.  Patient to be transferred to facility by [facility] )     Patient family notified on 04/08/17 of transfer.  Name of family member notified:  (Patient's daughter Jessikah is aware of D/C today. )     PHYSICIAN       Additional  Comment:    _______________________________________________ Tranice Laduke, Veronia Beets, LCSW 04/08/2017, 9:32 AM

## 2017-04-08 NOTE — Plan of Care (Signed)
Pt aware of stroke diagnosis and remains agreeable to short term rehab at discharge.  No changes to neuro status noted.

## 2017-04-09 DIAGNOSIS — J9601 Acute respiratory failure with hypoxia: Secondary | ICD-10-CM | POA: Diagnosis not present

## 2017-04-09 DIAGNOSIS — I480 Paroxysmal atrial fibrillation: Secondary | ICD-10-CM | POA: Diagnosis not present

## 2017-04-09 DIAGNOSIS — R5381 Other malaise: Secondary | ICD-10-CM | POA: Diagnosis not present

## 2017-04-09 DIAGNOSIS — I5189 Other ill-defined heart diseases: Secondary | ICD-10-CM | POA: Diagnosis not present

## 2017-04-13 ENCOUNTER — Other Ambulatory Visit: Payer: Self-pay | Admitting: *Deleted

## 2017-04-13 DIAGNOSIS — I639 Cerebral infarction, unspecified: Secondary | ICD-10-CM

## 2017-04-13 HISTORY — DX: Cerebral infarction, unspecified: I63.9

## 2017-04-13 NOTE — Patient Outreach (Signed)
[place] [facility]) Care Management  [facility] Social Work  04/13/2017  Samantha Aguirre [birth year] [id number]  Subjective:  This Education officer, museum met with patient at [facility]. This social worker discussed [facility] care management services, patient continues to consent to [facility] involvement verbally. Per patient, she will return home. Patient resides alone, however has a good network of family and friends that can assist her. Patient has a sister in [place] and a son and daughter. Patient states that she has a cain, bedside comode and a raised toilet seat at home. She may possibly need a walker when she returns home. Per patient, she is independent and is able to makes her own on meals, uses [employer] transportation to her  medical appointments. Patient has Medicare/Mediicaid and receives $10.00 in food stamps. Patient plans to re-appy for more assistance during her annual review. Patient states that she supplements by utilizing the food banks in the area. Patient's  daughter and son will assist as well.  Patient discussed  active particiption in PT and OT feeling  that her therapies are helping.   Objective:   Encounter Medications: Outpatient Encounter Medications as of 04/13/2017  Medication Sig  . acetaminophen (TYLENOL) 325 MG tablet Take 650 mg by mouth 2 (two) times daily.   Marland Kitchen albuterol (PROVENTIL HFA;VENTOLIN HFA) 108 (90 Base) MCG/ACT inhaler Inhale 2 puffs into the lungs every 6 (six) hours as needed for wheezing or shortness of breath.  Marland Kitchen apixaban (ELIQUIS) 5 MG TABS tablet Take 5 mg by mouth every 12 (twelve) hours.  Marland Kitchen atorvastatin (LIPITOR) 80 MG tablet Take 1 tablet (80 mg total) by mouth daily at 6 PM.  . diclofenac sodium (VOLTAREN) 1 % GEL Apply 2 g topically 4 (four) times daily.  . DULoxetine (CYMBALTA) 60 MG capsule Take 1 capsule (60 mg total) by mouth at bedtime.  . ferrous sulfate 325 (65 FE) MG EC tablet Take 1 tablet (325 mg total) by mouth 2 (two) times daily with a meal.   . fluticasone (FLONASE) 50 MCG/ACT nasal spray Place 1 spray into both nostrils daily.  . fluticasone (FLOVENT HFA) 220 MCG/ACT inhaler Inhale 1 puff into the lungs 2 (two) times daily.  . furosemide (LASIX) 20 MG tablet Take 10 mg by mouth daily.   Marland Kitchen gabapentin (NEURONTIN) 800 MG tablet Take 1 tablet (800 mg total) by mouth 2 (two) times daily.  Marland Kitchen glipiZIDE-metformin (METAGLIP) 5-500 MG tablet Take 1 tablet by mouth 2 (two) times daily before a meal.  . guaiFENesin-dextromethorphan (ROBITUSSIN DM) 100-10 MG/5ML syrup Take 5 mLs by mouth every 4 (four) hours as needed for cough.  . levETIRAcetam (KEPPRA) 500 MG tablet Take 1 tablet (500 mg total) by mouth 2 (two) times daily.  Marland Kitchen levothyroxine (SYNTHROID, LEVOTHROID) 175 MCG tablet Take 1 tablet (175 mcg total) by mouth daily before breakfast.  . lisinopril (PRINIVIL,ZESTRIL) 20 MG tablet Take 1 tablet (20 mg total) by mouth daily.  . nitroGLYCERIN (NITROSTAT) 0.4 MG SL tablet Place 1 tablet (0.4 mg total) under the tongue every 5 (five) minutes as needed for chest pain.  Marland Kitchen omeprazole (PRILOSEC) 20 MG capsule Take 20 mg by mouth 2 (two) times daily.  . potassium chloride (MICRO-K) 10 MEQ CR capsule Take 1 capsule (10 mEq total) by mouth daily.  . predniSONE (DELTASONE) 10 MG tablet 40 mg po daily 1 day, 30 mg po daily 1 day,20 mg po daily 1 day, 10 mg po daily 1 day.  . sucralfate (CARAFATE) 1 g  tablet TAKE (1) TABLET BY MOUTH FOUR TIMES A DAY  . SUMAtriptan (IMITREX) 25 MG tablet Take 1 tablet (25 mg total) by mouth once as needed for up to 1 dose for migraine. May repeat one dose in 2 hours if persists, max in 24 hrs  . umeclidinium bromide (INCRUSE ELLIPTA) 62.5 MCG/INH AEPB Inhale 1 puff into the lungs daily.   No facility-administered encounter medications on file as of 04/13/2017.     Functional Status:  In your present state of health, do you have any difficulty performing the following activities: 04/07/2017 04/03/2017  Hearing? - N   Vision? - N  Difficulty concentrating or making decisions? - N  Walking or climbing stairs? - N  Dressing or bathing? - N  Comment - -  Doing errands, shopping? Y N  Preparing Food and eating ? - -  Using the Toilet? - -  In the past six months, have you accidently leaked urine? - -  Comment - -  Do you have problems with loss of bowel control? - -  Managing your Medications? - -  Managing your Finances? - -  Housekeeping or managing your Housekeeping? - -  Some recent data might be hidden    Fall/Depression Screening:  PHQ 2/9 Scores 03/10/2017 11/24/2016 10/06/2016 11/13/2015  PHQ - 2 Score 2 0 2 4  PHQ- 9 Score 12 - 14 15    Assessment: Per patient and discharge planner, Samantha Aguirre, the family care meeting is scheduled for 04/14/17. Patient described as doing very well in rehab and will probably discharge prior to 20 days. Patient will discharge to her own home with [facility].  Plan: Patient to discharge to her own home with [facility]. Care planning meeting scheduled for 04/13/17. This Education officer, museum will continue to follow patient's progress in rehab.   [name] [facility] Care Management [phone]

## 2017-04-14 ENCOUNTER — Ambulatory Visit: Payer: Self-pay | Admitting: *Deleted

## 2017-04-14 DIAGNOSIS — I6389 Other cerebral infarction: Secondary | ICD-10-CM | POA: Diagnosis not present

## 2017-04-14 DIAGNOSIS — R5381 Other malaise: Secondary | ICD-10-CM | POA: Diagnosis not present

## 2017-04-21 ENCOUNTER — Other Ambulatory Visit: Payer: Self-pay | Admitting: *Deleted

## 2017-04-21 NOTE — Patient Outreach (Addendum)
[place]) Care Management  04/21/2017  Samantha Aguirre [birth year] [id number]   Post Acute visit to [facility] to follow up on patient's progress in rehab. Patient states that she is doing well and will plan to discharge home on Saturday 04/25/17. Patient will discharge home with [facility]. Patient has a friend who will pick her up to transport her home on the day of discharge. Patient would like a new walker with wheels, however when speaking with the discharge planner Emmie Niemann patient's insurance covered the cost  in 2017. She is not eligible for another one at this time. Discharge date confirmed with discharge planner for 04/25/17.  Per patient, she will check once she get's home for the walker. This Education officer, museum will provide patient with resources, if she has not identified the walker post discharge home.   Plan: This Education officer, museum will refer patient to [facility] for transition of care. This social worker will follow up with patient to further assess for community resource needs following her discharge on 04/25/17.   [name] [facility] Care Management [phone]

## 2017-04-23 DIAGNOSIS — I6389 Other cerebral infarction: Secondary | ICD-10-CM | POA: Diagnosis not present

## 2017-04-23 DIAGNOSIS — I5189 Other ill-defined heart diseases: Secondary | ICD-10-CM | POA: Diagnosis not present

## 2017-04-23 DIAGNOSIS — J9601 Acute respiratory failure with hypoxia: Secondary | ICD-10-CM | POA: Diagnosis not present

## 2017-04-23 DIAGNOSIS — J984 Other disorders of lung: Secondary | ICD-10-CM | POA: Diagnosis not present

## 2017-04-26 DIAGNOSIS — J441 Chronic obstructive pulmonary disease with (acute) exacerbation: Secondary | ICD-10-CM | POA: Diagnosis not present

## 2017-04-26 DIAGNOSIS — I5032 Chronic diastolic (congestive) heart failure: Secondary | ICD-10-CM | POA: Diagnosis not present

## 2017-04-26 DIAGNOSIS — E119 Type 2 diabetes mellitus without complications: Secondary | ICD-10-CM | POA: Diagnosis not present

## 2017-04-26 DIAGNOSIS — I110 Hypertensive heart disease with heart failure: Secondary | ICD-10-CM | POA: Diagnosis not present

## 2017-04-26 DIAGNOSIS — I480 Paroxysmal atrial fibrillation: Secondary | ICD-10-CM | POA: Diagnosis not present

## 2017-04-26 DIAGNOSIS — I2510 Atherosclerotic heart disease of native coronary artery without angina pectoris: Secondary | ICD-10-CM | POA: Diagnosis not present

## 2017-04-27 ENCOUNTER — Other Ambulatory Visit: Payer: Self-pay | Admitting: Family Medicine

## 2017-04-29 ENCOUNTER — Other Ambulatory Visit: Payer: Self-pay | Admitting: *Deleted

## 2017-04-29 DIAGNOSIS — I480 Paroxysmal atrial fibrillation: Secondary | ICD-10-CM | POA: Diagnosis not present

## 2017-04-29 DIAGNOSIS — I2510 Atherosclerotic heart disease of native coronary artery without angina pectoris: Secondary | ICD-10-CM | POA: Diagnosis not present

## 2017-04-29 DIAGNOSIS — E119 Type 2 diabetes mellitus without complications: Secondary | ICD-10-CM | POA: Diagnosis not present

## 2017-04-29 DIAGNOSIS — I110 Hypertensive heart disease with heart failure: Secondary | ICD-10-CM | POA: Diagnosis not present

## 2017-04-29 DIAGNOSIS — J441 Chronic obstructive pulmonary disease with (acute) exacerbation: Secondary | ICD-10-CM | POA: Diagnosis not present

## 2017-04-29 DIAGNOSIS — I5032 Chronic diastolic (congestive) heart failure: Secondary | ICD-10-CM | POA: Diagnosis not present

## 2017-04-29 NOTE — Patient Outreach (Signed)
[facility]) Care Management   04/29/2017  Unsuccessful telephone encounter to Samantha Aguirre, 72 year old female- follow up on referral received 4/16 from [place] CM services/transition of care/recent SNF discharge on 04/25/17.   Pt admitted to rehab after  Recent hospitalization March 22-27,2019 for COPD, stroke.   Pt's history includes but not limited to Diabetes type 2, CHF, CAD, Hypertension, Hyperlipidemia, Osteoporosis,Depression.   Unable to leave a voice message as message on pt's  Phone- mail box not accepting messages/voice message not been activated.   Plan:  Unsuccessful outreach letter to be sent pt and if no response in 10 business days to close case and notify PCP. RN CM also to make another outreach within next 2 days.    [name].   Center Care Management  [phone]

## 2017-05-04 ENCOUNTER — Other Ambulatory Visit: Payer: Self-pay | Admitting: *Deleted

## 2017-05-04 ENCOUNTER — Telehealth: Payer: Self-pay | Admitting: Family Medicine

## 2017-05-04 NOTE — Telephone Encounter (Signed)
Additional update, patient notified us today 05/04/17 that she is transferring her PCP to [facility] due to several reasons, including other specialists there, transportation and preference. Therefore, I will no longer be her PCP. She will follow-up with Dr Ginette Pitman PCP or other [place] [facility] provider.  Called patient back. Today due to [place] question. I spoke with patient, and advised her that she will need to re-direct this question to her new PCP and most preferably her Cardiologist to get their input on adjusting her medicines. I advised her that most likely she would be correct in temporarily increasing lasix, but I would not confirm a dose at this time, she should follow-up with her Cardiologist. She will contact Benjamine Mola back at [facility] to notify them of her PCP change and this update.  Nobie Putnam, [facility] 05/04/2017, 11:58 AM

## 2017-05-04 NOTE — Patient Outreach (Addendum)
[facility]) Care Management  05/04/2017  Kamerin Axford 1945-08-08 [id number]   Transition of care call:  Successful telephone encounter to [name], 72 year old female-  Follow up on referral received 4/16 from [place] for Community CM services/transition of care/recent SNF discharge on 04/25/17.  Pt admitted  To SNF after recent hospitalization March 22-27,2019 for COPD, stroke.  Pt's History includes but not limited to Diabetes type II, CHF, CAD, Hypertension,  Hyperlipidemia, Osteoporosis, Depression.  Spoke with pt, HIPAA identifiers  Verified, discussed purpose of call- follow up on referral.   Pt reports on status since discharge home- took an extra dose of rescue  Inhaler for sob, helped, currently no sob. Pt reports she continues to smoke, Smoking cessation was addressed, not ready to quit.   Pt reports legs are swollen up to her knees, last week had problem walking, has a walker which helps.  Pt reports her problem  talking (recent Stroke), gets her words mixed up if talk too long.  Pt reports getting home  Health services through [facility]- PT, aide.   Medications:  Pt reports managing her own medications, taking per    Discharge papers, unable to review with RN CM at this time.   MD follow up appointments:  Pt reports getting a new PCP, appointment was   Set up to see MD but got sick and was in the hospital for a month.  Pt    reports called [facility]) and was told earliest appointment    Would be 06/17/17 but on the list if cancellation occurs for earlier appointment.   RN CM discussed with pt [facility] transition of care program- follow weekly    Phone calls (31 days), a home visit (benefit of her insurance).     Plan:  As discussed with pt, plan to follow up again next week telephonically    And the following week initial home visit.             Plan to send barrier letter to [facility]  informing of [facility]  Involvement.    Addendum- call to [facility] 05/05/17 was  Informed pt's PCP to be Dr. Ginette Pitman.    [name].   [place] Care Management  [phone]

## 2017-05-04 NOTE — Telephone Encounter (Signed)
Samantha Aguirre from [facility] reports patient leg swelling getting worst from Friday and has some SOB. She is asking for dose change for Lasix can she take twice daily instead of once. I advised to send the message to provider and will call patient.

## 2017-05-05 DIAGNOSIS — I480 Paroxysmal atrial fibrillation: Secondary | ICD-10-CM | POA: Diagnosis not present

## 2017-05-05 DIAGNOSIS — I5032 Chronic diastolic (congestive) heart failure: Secondary | ICD-10-CM | POA: Diagnosis not present

## 2017-05-05 DIAGNOSIS — I2510 Atherosclerotic heart disease of native coronary artery without angina pectoris: Secondary | ICD-10-CM | POA: Diagnosis not present

## 2017-05-05 DIAGNOSIS — J441 Chronic obstructive pulmonary disease with (acute) exacerbation: Secondary | ICD-10-CM | POA: Diagnosis not present

## 2017-05-05 DIAGNOSIS — I110 Hypertensive heart disease with heart failure: Secondary | ICD-10-CM | POA: Diagnosis not present

## 2017-05-05 DIAGNOSIS — E119 Type 2 diabetes mellitus without complications: Secondary | ICD-10-CM | POA: Diagnosis not present

## 2017-05-06 ENCOUNTER — Other Ambulatory Visit: Payer: Self-pay | Admitting: *Deleted

## 2017-05-06 NOTE — Patient Outreach (Signed)
[place] [facility]) Care Management  05/06/2017  Khalise Billard April 10, 1945 [id number]   Phone call to patient to assess for social work needs following rehab sty at [facility]. Per patient she ws dong fine until a couple of days ago when her legs and feet started to well. Per patient, [facility] is actively involved and may need to return to the hospital if swelling does not go down with the increase in lasix. Patient is currently in between providers right now. Patient will be transitioning to Dr. Ginette Pitman, however her initial  appointment is not scheduled until 06/17/17. Patient currently on the cancellation list. Patient verbalized having no community resource needs at this time. She now utilizes [employer] [facility] for transportation to medical appointments. She continues to receive $10.00 in food stamps and continues to supplement by utilizing food banks. Patient states that her daughter and son assist her when they can. This social worker encouraged patient to call if any community resource needs arise in the future.    [name] [facility] Care Management [phone]

## 2017-05-07 DIAGNOSIS — E119 Type 2 diabetes mellitus without complications: Secondary | ICD-10-CM | POA: Diagnosis not present

## 2017-05-07 DIAGNOSIS — I2510 Atherosclerotic heart disease of native coronary artery without angina pectoris: Secondary | ICD-10-CM | POA: Diagnosis not present

## 2017-05-07 DIAGNOSIS — J441 Chronic obstructive pulmonary disease with (acute) exacerbation: Secondary | ICD-10-CM | POA: Diagnosis not present

## 2017-05-07 DIAGNOSIS — I5032 Chronic diastolic (congestive) heart failure: Secondary | ICD-10-CM | POA: Diagnosis not present

## 2017-05-07 DIAGNOSIS — I110 Hypertensive heart disease with heart failure: Secondary | ICD-10-CM | POA: Diagnosis not present

## 2017-05-07 DIAGNOSIS — I480 Paroxysmal atrial fibrillation: Secondary | ICD-10-CM | POA: Diagnosis not present

## 2017-05-11 ENCOUNTER — Other Ambulatory Visit: Payer: Self-pay | Admitting: *Deleted

## 2017-05-11 DIAGNOSIS — I480 Paroxysmal atrial fibrillation: Secondary | ICD-10-CM | POA: Diagnosis not present

## 2017-05-11 DIAGNOSIS — I2510 Atherosclerotic heart disease of native coronary artery without angina pectoris: Secondary | ICD-10-CM | POA: Diagnosis not present

## 2017-05-11 DIAGNOSIS — J441 Chronic obstructive pulmonary disease with (acute) exacerbation: Secondary | ICD-10-CM | POA: Diagnosis not present

## 2017-05-11 DIAGNOSIS — E119 Type 2 diabetes mellitus without complications: Secondary | ICD-10-CM | POA: Diagnosis not present

## 2017-05-11 DIAGNOSIS — I110 Hypertensive heart disease with heart failure: Secondary | ICD-10-CM | POA: Diagnosis not present

## 2017-05-11 DIAGNOSIS — I5032 Chronic diastolic (congestive) heart failure: Secondary | ICD-10-CM | POA: Diagnosis not present

## 2017-05-11 NOTE — Patient Outreach (Addendum)
[place] [facility]) Care Management  05/11/2017  Samantha Aguirre 03/30/1945 [id number]  Successful telephone encounter to Samantha Aguirre, 72 year old female for transition of care, Ongoing follow up on recent SNF ([facility]) 04/25/17.  Pt admitted to rehab after  Recent hospitalization March 22-27,2019 for COPD, Stroke (acute CVA).   Spoke with  Pt, HIPAA identifiers verified.   Pt reports on current clinical status- edema in bilateral legs  Has gone down a lot, still has a little edema below the knees.  Pt reports speech is okay if  Don't talk too long.  Pt reports taking all of her medications.  Pt reports [place] RN still coming,  Today was the first day for [facility] PT- legs were tight, did okay walking.   Pt reports has not  Received a call from new PCP office for an earlier appointment, did a get a note from  PCP office for scheduled appointment 06/17/17.  RN CM suggested pt call PCP office  Again next week to see if earlier appointment possible, on list for cancellations.    Plan: As discussed with pt, plan to follow up again next week for initial home visit.  Samantha Aguirre.   [place] Care Management  [phone]

## 2017-05-13 DIAGNOSIS — I480 Paroxysmal atrial fibrillation: Secondary | ICD-10-CM | POA: Diagnosis not present

## 2017-05-13 DIAGNOSIS — J441 Chronic obstructive pulmonary disease with (acute) exacerbation: Secondary | ICD-10-CM | POA: Diagnosis not present

## 2017-05-13 DIAGNOSIS — I2510 Atherosclerotic heart disease of native coronary artery without angina pectoris: Secondary | ICD-10-CM | POA: Diagnosis not present

## 2017-05-13 DIAGNOSIS — E119 Type 2 diabetes mellitus without complications: Secondary | ICD-10-CM | POA: Diagnosis not present

## 2017-05-13 DIAGNOSIS — I110 Hypertensive heart disease with heart failure: Secondary | ICD-10-CM | POA: Diagnosis not present

## 2017-05-13 DIAGNOSIS — I5032 Chronic diastolic (congestive) heart failure: Secondary | ICD-10-CM | POA: Diagnosis not present

## 2017-05-15 DIAGNOSIS — E119 Type 2 diabetes mellitus without complications: Secondary | ICD-10-CM | POA: Diagnosis not present

## 2017-05-15 DIAGNOSIS — I2510 Atherosclerotic heart disease of native coronary artery without angina pectoris: Secondary | ICD-10-CM | POA: Diagnosis not present

## 2017-05-15 DIAGNOSIS — J441 Chronic obstructive pulmonary disease with (acute) exacerbation: Secondary | ICD-10-CM | POA: Diagnosis not present

## 2017-05-15 DIAGNOSIS — I110 Hypertensive heart disease with heart failure: Secondary | ICD-10-CM | POA: Diagnosis not present

## 2017-05-15 DIAGNOSIS — I5032 Chronic diastolic (congestive) heart failure: Secondary | ICD-10-CM | POA: Diagnosis not present

## 2017-05-15 DIAGNOSIS — I480 Paroxysmal atrial fibrillation: Secondary | ICD-10-CM | POA: Diagnosis not present

## 2017-05-19 ENCOUNTER — Other Ambulatory Visit: Payer: Self-pay | Admitting: *Deleted

## 2017-05-19 ENCOUNTER — Encounter: Payer: Self-pay | Admitting: *Deleted

## 2017-05-19 DIAGNOSIS — I480 Paroxysmal atrial fibrillation: Secondary | ICD-10-CM | POA: Diagnosis not present

## 2017-05-19 DIAGNOSIS — I2510 Atherosclerotic heart disease of native coronary artery without angina pectoris: Secondary | ICD-10-CM | POA: Diagnosis not present

## 2017-05-19 DIAGNOSIS — J441 Chronic obstructive pulmonary disease with (acute) exacerbation: Secondary | ICD-10-CM | POA: Diagnosis not present

## 2017-05-19 DIAGNOSIS — E119 Type 2 diabetes mellitus without complications: Secondary | ICD-10-CM | POA: Diagnosis not present

## 2017-05-19 DIAGNOSIS — I110 Hypertensive heart disease with heart failure: Secondary | ICD-10-CM | POA: Diagnosis not present

## 2017-05-19 DIAGNOSIS — I5032 Chronic diastolic (congestive) heart failure: Secondary | ICD-10-CM | POA: Diagnosis not present

## 2017-05-19 NOTE — Patient Outreach (Addendum)
[facility]) Care Management   05/19/2017  Niko Jakel 25-Apr-1945 [id number]  Joany Khatib is an 72 y.o. female  Subjective:  Pt reports chronic pain in head from stroke, has small vessel disease and 2  Tumors, medication does not help the pain/has some  relief with rest.  Pt reports also  Right arm weakness from stroke, working with [facility] PT, OT and ST has not come yet.  Pt  Reports her thinking process is effected by stroke, glad neighbor is here today to help  Her during home visit.   Pt reports she has her medications delivered by [facility]/ Pill packs, makes it easier not to forget her medications.    Objective:   Vitals:   05/19/17 0937  BP: 126/68  Pulse: 65  Resp: 20  SpO2: 98%    ROS  Physical Exam  Constitutional: She is oriented to person, place, and time. She appears well-developed and well-nourished.  Cardiovascular: Normal rate, regular rhythm and normal heart sounds.  Respiratory: Effort normal and breath sounds normal.  GI: Soft.  Musculoskeletal:  Per pt right arm weakness from stroke.   Neurological: She is alert and oriented to person, place, and time.  Skin: Skin is warm and dry.  Psychiatric: She has a normal mood and affect. Her behavior is normal. Judgment normal.  Thinking process off at times due to stroke.     Encounter Medications:   Outpatient Encounter Medications as of 05/19/2017  Medication Sig  . albuterol (PROVENTIL HFA;VENTOLIN HFA) 108 (90 Base) MCG/ACT inhaler Inhale 2 puffs into the lungs every 6 (six) hours as needed for wheezing or shortness of breath.  Marland Kitchen apixaban (ELIQUIS) 5 MG TABS tablet Take 5 mg by mouth every 12 (twelve) hours.  Marland Kitchen atorvastatin (LIPITOR) 80 MG tablet Take 1 tablet (80 mg total) by mouth daily at 6 PM.  . diclofenac sodium (VOLTAREN) 1 % GEL Apply 2 g topically 4 (four) times daily.  . DULoxetine (CYMBALTA) 60 MG capsule Take 1 capsule (60 mg total) by mouth at bedtime.  . ferrous sulfate  325 (65 FE) MG EC tablet Take 1 tablet (325 mg total) by mouth 2 (two) times daily with a meal.  . fluticasone (FLONASE) 50 MCG/ACT nasal spray Place 1 spray into both nostrils daily.  . fluticasone (FLOVENT HFA) 220 MCG/ACT inhaler Inhale 1 puff into the lungs 2 (two) times daily.  . furosemide (LASIX) 20 MG tablet Take 10 mg by mouth daily.   Marland Kitchen gabapentin (NEURONTIN) 800 MG tablet Take 1 tablet (800 mg total) by mouth 2 (two) times daily.  Marland Kitchen glipiZIDE-metformin (METAGLIP) 5-500 MG tablet Take 1 tablet by mouth 2 (two) times daily before a meal.  . guaiFENesin-dextromethorphan (ROBITUSSIN DM) 100-10 MG/5ML syrup Take 5 mLs by mouth every 4 (four) hours as needed for cough.  . levETIRAcetam (KEPPRA) 500 MG tablet Take 1 tablet (500 mg total) by mouth 2 (two) times daily.  Marland Kitchen levothyroxine (SYNTHROID, LEVOTHROID) 175 MCG tablet Take 1 tablet (175 mcg total) by mouth daily before breakfast.  . lisinopril (PRINIVIL,ZESTRIL) 20 MG tablet Take 1 tablet (20 mg total) by mouth daily.  Marland Kitchen MAPAP 325 MG tablet TAKE 2 TABLETS BY MOUTH TWICE DAILY  . nitroGLYCERIN (NITROSTAT) 0.4 MG SL tablet Place 1 tablet (0.4 mg total) under the tongue every 5 (five) minutes as needed for chest pain.  Marland Kitchen omeprazole (PRILOSEC) 20 MG capsule Take 20 mg by mouth 2 (two) times daily.  . potassium chloride (MICRO-K) 10  MEQ CR capsule Take 1 capsule (10 mEq total) by mouth daily.  . predniSONE (DELTASONE) 10 MG tablet 40 mg po daily 1 day, 30 mg po daily 1 day,20 mg po daily 1 day, 10 mg po daily 1 day.  . sucralfate (CARAFATE) 1 g tablet TAKE (1) TABLET BY MOUTH FOUR TIMES A DAY  . SUMAtriptan (IMITREX) 25 MG tablet Take 1 tablet (25 mg total) by mouth once as needed for up to 1 dose for migraine. May repeat one dose in 2 hours if persists, max in 24 hrs  . umeclidinium bromide (INCRUSE ELLIPTA) 62.5 MCG/INH AEPB Inhale 1 puff into the lungs daily.   No facility-administered encounter medications on file as of 05/19/2017.      Functional Status:   In your present state of health, do you have any difficulty performing the following activities: 05/19/2017 04/07/2017  Hearing? N -  Vision? N -  Difficulty concentrating or making decisions? N -  Walking or climbing stairs? Y -  Dressing or bathing? Y -  Comment - -  Doing errands, shopping? Tempie Donning  Preparing Food and eating ? Y -  Using the Toilet? N -  In the past six months, have you accidently leaked urine? Y -  Comment - -  Do you have problems with loss of bowel control? N -  Managing your Medications? [place] does pill packs  -  Managing your Finances? N -  Housekeeping or managing your Housekeeping? Y -  Some recent data might be hidden    Fall/Depression Screening:    Fall Risk  05/19/2017 03/10/2017 11/24/2016  Falls in the past year? Yes Yes No  Number falls in past yr: 2 or more 2 or more -  Injury with Fall? Yes Yes -  Comment - - -  Risk Factor Category  High Fall Risk High Fall Risk -  Risk for fall due to : Impaired balance/gait Impaired balance/gait -  Risk for fall due to: Comment - recently got a cane  -  Follow up - Falls prevention discussed -   PHQ 2/9 Scores 05/19/2017 03/10/2017 11/24/2016 10/06/2016 11/13/2015  PHQ - 2 Score 0 2 0 2 4  PHQ- 9 Score - 12 - 14 15    Assessment:  Pleasant 72 year old female, resides alone, several friends close by assist as needed.  Anise Salvo present during part of home visit, placed on [facility] consent form.  RN CM following pt for transition of care, recent SNF discharge 04/25/17, admitted to rehab after  Recent hospitalization March 22-27,2019 for COPD, stroke.   Recent CVA:  Per pt weakness in right arm, observed pt ambulating with walker most times.     COPD:  Lungs clear, per using rescue inhaler as needed.  Has nebulizer machine but medication       Expired.   RN CM discussed with pt to ask PCP at upcoming visit for prescription for refill.    Medications:  Medication review completed  with pt, per pt using Incruse Ellipta only as needed.     Plan:  As discussed with pt, to follow up again next week telephonically (part of ongoing transition of      Care).           Plan to send new PCP Dr. Ginette Pitman 05/19/17 initial home visit.    [facility] CM Care Plan Problem One     Most Recent Value  Care Plan Problem One  Risk  for readmission related to recent SNF discharge, recent hospitalization for COPD, stroke   Role Documenting the Problem One  Care Management [place] for Problem One  Active  [facility] Long Term Goal   No readmissions to SNF or hospital within the next 31 days   [facility] Long Term Goal Start Date  05/04/17  Interventions for Problem One Long Term Goal  5/7- home visit/assessment done,  reviewed s/s of stroke when to get help.    [facility] CM Short Term Goal #1   Pt will see improvement in mobility participating in [facility] PT services in the next 30 days   [facility] CM Short Term Goal #1 Start Date  05/04/17  Interventions for Short Term Goal #1  5/7 home visit- discussed with pt continuing to work with [facility] PT - observed pt ambulating with and without walker   [facility] CM Short Term Goal #2   Pt would get an earlier PCP follow up appointment within the next 30 days   [facility] CM Short Term Goal #2 Start Date  05/04/17  Interventions for Short Term Goal #2  5/7 home visit discussed with pt earlier PCP visit- per pt visit still scheduled for 6/5.      [name].   [place] Care Management  [phone]

## 2017-05-20 DIAGNOSIS — E119 Type 2 diabetes mellitus without complications: Secondary | ICD-10-CM | POA: Diagnosis not present

## 2017-05-20 DIAGNOSIS — J441 Chronic obstructive pulmonary disease with (acute) exacerbation: Secondary | ICD-10-CM | POA: Diagnosis not present

## 2017-05-20 DIAGNOSIS — I480 Paroxysmal atrial fibrillation: Secondary | ICD-10-CM | POA: Diagnosis not present

## 2017-05-20 DIAGNOSIS — I2510 Atherosclerotic heart disease of native coronary artery without angina pectoris: Secondary | ICD-10-CM | POA: Diagnosis not present

## 2017-05-20 DIAGNOSIS — I110 Hypertensive heart disease with heart failure: Secondary | ICD-10-CM | POA: Diagnosis not present

## 2017-05-20 DIAGNOSIS — I5032 Chronic diastolic (congestive) heart failure: Secondary | ICD-10-CM | POA: Diagnosis not present

## 2017-05-22 ENCOUNTER — Telehealth: Payer: Self-pay | Admitting: Family Medicine

## 2017-05-22 DIAGNOSIS — I5032 Chronic diastolic (congestive) heart failure: Secondary | ICD-10-CM | POA: Diagnosis not present

## 2017-05-22 DIAGNOSIS — I2510 Atherosclerotic heart disease of native coronary artery without angina pectoris: Secondary | ICD-10-CM | POA: Diagnosis not present

## 2017-05-22 DIAGNOSIS — I480 Paroxysmal atrial fibrillation: Secondary | ICD-10-CM | POA: Diagnosis not present

## 2017-05-22 DIAGNOSIS — E119 Type 2 diabetes mellitus without complications: Secondary | ICD-10-CM | POA: Diagnosis not present

## 2017-05-22 DIAGNOSIS — J441 Chronic obstructive pulmonary disease with (acute) exacerbation: Secondary | ICD-10-CM | POA: Diagnosis not present

## 2017-05-22 DIAGNOSIS — I110 Hypertensive heart disease with heart failure: Secondary | ICD-10-CM | POA: Diagnosis not present

## 2017-05-22 NOTE — Telephone Encounter (Signed)
She is no longer our patient. She is followed by Dr Ginette Pitman at [facility].  Please call back and re-route this call to proper doctors office.  Nobie Putnam, [facility] 05/22/2017, 3:14 PM

## 2017-05-22 NOTE — Telephone Encounter (Signed)
Mark with [facility] PT said pt felt tired yesterday with some shortness of breath and had 3 lb weight gain.  She feels better today but does have pitting edema in ankles.  His call back number is [phone]

## 2017-05-26 DIAGNOSIS — E119 Type 2 diabetes mellitus without complications: Secondary | ICD-10-CM | POA: Diagnosis not present

## 2017-05-26 DIAGNOSIS — I2510 Atherosclerotic heart disease of native coronary artery without angina pectoris: Secondary | ICD-10-CM | POA: Diagnosis not present

## 2017-05-26 DIAGNOSIS — I110 Hypertensive heart disease with heart failure: Secondary | ICD-10-CM | POA: Diagnosis not present

## 2017-05-26 DIAGNOSIS — I5032 Chronic diastolic (congestive) heart failure: Secondary | ICD-10-CM | POA: Diagnosis not present

## 2017-05-26 DIAGNOSIS — I480 Paroxysmal atrial fibrillation: Secondary | ICD-10-CM | POA: Diagnosis not present

## 2017-05-26 DIAGNOSIS — J441 Chronic obstructive pulmonary disease with (acute) exacerbation: Secondary | ICD-10-CM | POA: Diagnosis not present

## 2017-05-27 ENCOUNTER — Other Ambulatory Visit: Payer: Self-pay | Admitting: *Deleted

## 2017-05-27 NOTE — Patient Outreach (Signed)
[facility]) Care Management  05/27/2017  Evalette Montrose 1945/11/13 [id number]   Successful telephone encounter to [name], 72 year old female for transition of care/ongoing follow  Up on SNF discharge home 413/19.  Pt admitted to rehab after recent hospitalization March 22-27,2019 for  COPD, Stroke (acute CVA).  Spoke with pt, HIPAA identifiers verified.  Pt reports not feeling good last  Couple of days, case of edema in legs, gained 4 lbs in 2 nights.   Pt reports took an extra Lasix today,   Pt reports HH PT called her old PCP office yesterday about the fluid, main HH PT is suppose to come  Either tomorrow or Friday.    RN CM discussed with pt view in EMR noted HH PT called old PCP office,  Yesterday, to call PT today to let know to follow up with Dr. Ginette Pitman to which she said she would do.  Pt  Reports leg and head hurts, taking all of her medications.     Plan:  As discussed with pt, to follow up again next week telephonically.    [name].   [place] Care Management  [phone]

## 2017-05-28 DIAGNOSIS — I5032 Chronic diastolic (congestive) heart failure: Secondary | ICD-10-CM | POA: Diagnosis not present

## 2017-05-28 DIAGNOSIS — E119 Type 2 diabetes mellitus without complications: Secondary | ICD-10-CM | POA: Diagnosis not present

## 2017-05-28 DIAGNOSIS — I110 Hypertensive heart disease with heart failure: Secondary | ICD-10-CM | POA: Diagnosis not present

## 2017-05-28 DIAGNOSIS — J441 Chronic obstructive pulmonary disease with (acute) exacerbation: Secondary | ICD-10-CM | POA: Diagnosis not present

## 2017-05-28 DIAGNOSIS — I480 Paroxysmal atrial fibrillation: Secondary | ICD-10-CM | POA: Diagnosis not present

## 2017-05-28 DIAGNOSIS — I2510 Atherosclerotic heart disease of native coronary artery without angina pectoris: Secondary | ICD-10-CM | POA: Diagnosis not present

## 2017-05-29 DIAGNOSIS — I2510 Atherosclerotic heart disease of native coronary artery without angina pectoris: Secondary | ICD-10-CM | POA: Diagnosis not present

## 2017-05-29 DIAGNOSIS — J441 Chronic obstructive pulmonary disease with (acute) exacerbation: Secondary | ICD-10-CM | POA: Diagnosis not present

## 2017-05-29 DIAGNOSIS — I110 Hypertensive heart disease with heart failure: Secondary | ICD-10-CM | POA: Diagnosis not present

## 2017-05-29 DIAGNOSIS — I480 Paroxysmal atrial fibrillation: Secondary | ICD-10-CM | POA: Diagnosis not present

## 2017-05-29 DIAGNOSIS — I5032 Chronic diastolic (congestive) heart failure: Secondary | ICD-10-CM | POA: Diagnosis not present

## 2017-05-29 DIAGNOSIS — E119 Type 2 diabetes mellitus without complications: Secondary | ICD-10-CM | POA: Diagnosis not present

## 2017-06-02 DIAGNOSIS — J441 Chronic obstructive pulmonary disease with (acute) exacerbation: Secondary | ICD-10-CM | POA: Diagnosis not present

## 2017-06-02 DIAGNOSIS — I110 Hypertensive heart disease with heart failure: Secondary | ICD-10-CM | POA: Diagnosis not present

## 2017-06-02 DIAGNOSIS — E119 Type 2 diabetes mellitus without complications: Secondary | ICD-10-CM | POA: Diagnosis not present

## 2017-06-02 DIAGNOSIS — I5032 Chronic diastolic (congestive) heart failure: Secondary | ICD-10-CM | POA: Diagnosis not present

## 2017-06-02 DIAGNOSIS — I2510 Atherosclerotic heart disease of native coronary artery without angina pectoris: Secondary | ICD-10-CM | POA: Diagnosis not present

## 2017-06-02 DIAGNOSIS — M6281 Muscle weakness (generalized): Secondary | ICD-10-CM | POA: Diagnosis not present

## 2017-06-03 DIAGNOSIS — I2510 Atherosclerotic heart disease of native coronary artery without angina pectoris: Secondary | ICD-10-CM | POA: Diagnosis not present

## 2017-06-03 DIAGNOSIS — J441 Chronic obstructive pulmonary disease with (acute) exacerbation: Secondary | ICD-10-CM | POA: Diagnosis not present

## 2017-06-03 DIAGNOSIS — M6281 Muscle weakness (generalized): Secondary | ICD-10-CM | POA: Diagnosis not present

## 2017-06-03 DIAGNOSIS — I110 Hypertensive heart disease with heart failure: Secondary | ICD-10-CM | POA: Diagnosis not present

## 2017-06-03 DIAGNOSIS — I5032 Chronic diastolic (congestive) heart failure: Secondary | ICD-10-CM | POA: Diagnosis not present

## 2017-06-03 DIAGNOSIS — E119 Type 2 diabetes mellitus without complications: Secondary | ICD-10-CM | POA: Diagnosis not present

## 2017-06-04 ENCOUNTER — Ambulatory Visit: Payer: Self-pay | Admitting: *Deleted

## 2017-06-04 ENCOUNTER — Other Ambulatory Visit: Payer: Self-pay | Admitting: *Deleted

## 2017-06-04 DIAGNOSIS — J441 Chronic obstructive pulmonary disease with (acute) exacerbation: Secondary | ICD-10-CM | POA: Diagnosis not present

## 2017-06-04 DIAGNOSIS — E119 Type 2 diabetes mellitus without complications: Secondary | ICD-10-CM | POA: Diagnosis not present

## 2017-06-04 DIAGNOSIS — M6281 Muscle weakness (generalized): Secondary | ICD-10-CM | POA: Diagnosis not present

## 2017-06-04 DIAGNOSIS — I110 Hypertensive heart disease with heart failure: Secondary | ICD-10-CM | POA: Diagnosis not present

## 2017-06-04 DIAGNOSIS — I2510 Atherosclerotic heart disease of native coronary artery without angina pectoris: Secondary | ICD-10-CM | POA: Diagnosis not present

## 2017-06-04 DIAGNOSIS — I5032 Chronic diastolic (congestive) heart failure: Secondary | ICD-10-CM | POA: Diagnosis not present

## 2017-06-04 NOTE — Patient Outreach (Signed)
[facility]) Care Management  06/04/2017  Samantha Aguirre 1945-04-15 [id number]  Unsuccessful telephone encounter to [name], 72 year old female for transition of care (final call), Ongoing follow up on SNF discharge home 04/25/17.  Pt admitted to rehab after recent hospitalization  March 22-27,2019 for COPD, Stroke (acute CVA).   Unable to leave a voice message as mail box not set  Up for voice message.   Plan:  Unsuccessful outreach letter to be sent and if no response in 10 business days to close case.  RN CM to make next outreach call in 3 business days.   [name].   [place] Care Management  [phone]

## 2017-06-09 DIAGNOSIS — J441 Chronic obstructive pulmonary disease with (acute) exacerbation: Secondary | ICD-10-CM | POA: Diagnosis not present

## 2017-06-09 DIAGNOSIS — E119 Type 2 diabetes mellitus without complications: Secondary | ICD-10-CM | POA: Diagnosis not present

## 2017-06-09 DIAGNOSIS — I2510 Atherosclerotic heart disease of native coronary artery without angina pectoris: Secondary | ICD-10-CM | POA: Diagnosis not present

## 2017-06-09 DIAGNOSIS — I5032 Chronic diastolic (congestive) heart failure: Secondary | ICD-10-CM | POA: Diagnosis not present

## 2017-06-09 DIAGNOSIS — M6281 Muscle weakness (generalized): Secondary | ICD-10-CM | POA: Diagnosis not present

## 2017-06-09 DIAGNOSIS — I110 Hypertensive heart disease with heart failure: Secondary | ICD-10-CM | POA: Diagnosis not present

## 2017-06-10 ENCOUNTER — Other Ambulatory Visit: Payer: Self-pay | Admitting: *Deleted

## 2017-06-10 DIAGNOSIS — I110 Hypertensive heart disease with heart failure: Secondary | ICD-10-CM | POA: Diagnosis not present

## 2017-06-10 DIAGNOSIS — M6281 Muscle weakness (generalized): Secondary | ICD-10-CM | POA: Diagnosis not present

## 2017-06-10 DIAGNOSIS — E119 Type 2 diabetes mellitus without complications: Secondary | ICD-10-CM | POA: Diagnosis not present

## 2017-06-10 DIAGNOSIS — I5032 Chronic diastolic (congestive) heart failure: Secondary | ICD-10-CM | POA: Diagnosis not present

## 2017-06-10 DIAGNOSIS — I2510 Atherosclerotic heart disease of native coronary artery without angina pectoris: Secondary | ICD-10-CM | POA: Diagnosis not present

## 2017-06-10 DIAGNOSIS — J441 Chronic obstructive pulmonary disease with (acute) exacerbation: Secondary | ICD-10-CM | POA: Diagnosis not present

## 2017-06-10 NOTE — Patient Outreach (Signed)
[facility]) Care Management  06/10/2017  Gabrielle Wakeland Jul 04, 1945 [id number]  Successful telephone encounter to [name], 72 year old female for final  Transition of care call/ongoing follow up on SNF discharge home on 04/25/17, admitted To rehab after recent hospitalization March 22-27,2019 for COPD, Stroke.   Spoke with  Pt, HIPAA identifiers verified.   Pt reports feeling rough, full of fluid in legs for 2 weeks,  Up and down, one day swelling was down but then went back up.  Pt reports Home Health called MD, have to wait to follow on 6/5 (new pt).  Pt reports keeping legs  Elevated, helping a little.   Pt reports walking was better yesterday, sob not bad/no problems yesterday.  Pt reports taking all of her medications.  Pt reports not been  Checking her sugars, waiting to have new PCP give her a prescription for glucometer.   RN CM discussed with pt today is her final transition of care call, plan to  transfer her  case to another  [facility] RN CM(Kim Bertell Maria) for chronic disease management - to follow up with her next month telephonically.   Pt confirmed has [facility]'s main office number to Call if case management needs arise as well as 24 hour nurse advice number.    Plan:  As discussed with pt, to transfer her case to another [facility] RN CM.           RN CM to provide update on pt to  Landis Martins [facility] RN CM.     [name].   [place] Care Management  [phone]

## 2017-06-11 DIAGNOSIS — J441 Chronic obstructive pulmonary disease with (acute) exacerbation: Secondary | ICD-10-CM | POA: Diagnosis not present

## 2017-06-11 DIAGNOSIS — I110 Hypertensive heart disease with heart failure: Secondary | ICD-10-CM | POA: Diagnosis not present

## 2017-06-11 DIAGNOSIS — E119 Type 2 diabetes mellitus without complications: Secondary | ICD-10-CM | POA: Diagnosis not present

## 2017-06-11 DIAGNOSIS — I2510 Atherosclerotic heart disease of native coronary artery without angina pectoris: Secondary | ICD-10-CM | POA: Diagnosis not present

## 2017-06-11 DIAGNOSIS — I5032 Chronic diastolic (congestive) heart failure: Secondary | ICD-10-CM | POA: Diagnosis not present

## 2017-06-11 DIAGNOSIS — M6281 Muscle weakness (generalized): Secondary | ICD-10-CM | POA: Diagnosis not present

## 2017-06-12 DIAGNOSIS — I5032 Chronic diastolic (congestive) heart failure: Secondary | ICD-10-CM | POA: Diagnosis not present

## 2017-06-12 DIAGNOSIS — M6281 Muscle weakness (generalized): Secondary | ICD-10-CM | POA: Diagnosis not present

## 2017-06-12 DIAGNOSIS — I110 Hypertensive heart disease with heart failure: Secondary | ICD-10-CM | POA: Diagnosis not present

## 2017-06-12 DIAGNOSIS — J441 Chronic obstructive pulmonary disease with (acute) exacerbation: Secondary | ICD-10-CM | POA: Diagnosis not present

## 2017-06-12 DIAGNOSIS — I2510 Atherosclerotic heart disease of native coronary artery without angina pectoris: Secondary | ICD-10-CM | POA: Diagnosis not present

## 2017-06-12 DIAGNOSIS — E119 Type 2 diabetes mellitus without complications: Secondary | ICD-10-CM | POA: Diagnosis not present

## 2017-06-15 DIAGNOSIS — E119 Type 2 diabetes mellitus without complications: Secondary | ICD-10-CM | POA: Diagnosis not present

## 2017-06-15 DIAGNOSIS — I2510 Atherosclerotic heart disease of native coronary artery without angina pectoris: Secondary | ICD-10-CM | POA: Diagnosis not present

## 2017-06-15 DIAGNOSIS — M6281 Muscle weakness (generalized): Secondary | ICD-10-CM | POA: Diagnosis not present

## 2017-06-15 DIAGNOSIS — I110 Hypertensive heart disease with heart failure: Secondary | ICD-10-CM | POA: Diagnosis not present

## 2017-06-15 DIAGNOSIS — J441 Chronic obstructive pulmonary disease with (acute) exacerbation: Secondary | ICD-10-CM | POA: Diagnosis not present

## 2017-06-15 DIAGNOSIS — I5032 Chronic diastolic (congestive) heart failure: Secondary | ICD-10-CM | POA: Diagnosis not present

## 2017-06-16 DIAGNOSIS — I5032 Chronic diastolic (congestive) heart failure: Secondary | ICD-10-CM | POA: Diagnosis not present

## 2017-06-16 DIAGNOSIS — I2510 Atherosclerotic heart disease of native coronary artery without angina pectoris: Secondary | ICD-10-CM | POA: Diagnosis not present

## 2017-06-16 DIAGNOSIS — J441 Chronic obstructive pulmonary disease with (acute) exacerbation: Secondary | ICD-10-CM | POA: Diagnosis not present

## 2017-06-16 DIAGNOSIS — E119 Type 2 diabetes mellitus without complications: Secondary | ICD-10-CM | POA: Diagnosis not present

## 2017-06-16 DIAGNOSIS — M6281 Muscle weakness (generalized): Secondary | ICD-10-CM | POA: Diagnosis not present

## 2017-06-16 DIAGNOSIS — I110 Hypertensive heart disease with heart failure: Secondary | ICD-10-CM | POA: Diagnosis not present

## 2017-06-17 DIAGNOSIS — J441 Chronic obstructive pulmonary disease with (acute) exacerbation: Secondary | ICD-10-CM | POA: Diagnosis not present

## 2017-06-17 DIAGNOSIS — G40909 Epilepsy, unspecified, not intractable, without status epilepticus: Secondary | ICD-10-CM | POA: Diagnosis not present

## 2017-06-17 DIAGNOSIS — D649 Anemia, unspecified: Secondary | ICD-10-CM | POA: Diagnosis not present

## 2017-06-17 DIAGNOSIS — I110 Hypertensive heart disease with heart failure: Secondary | ICD-10-CM | POA: Diagnosis not present

## 2017-06-17 DIAGNOSIS — R6889 Other general symptoms and signs: Secondary | ICD-10-CM | POA: Diagnosis not present

## 2017-06-17 DIAGNOSIS — E119 Type 2 diabetes mellitus without complications: Secondary | ICD-10-CM | POA: Diagnosis not present

## 2017-06-17 DIAGNOSIS — E1140 Type 2 diabetes mellitus with diabetic neuropathy, unspecified: Secondary | ICD-10-CM | POA: Diagnosis not present

## 2017-06-17 DIAGNOSIS — R8290 Unspecified abnormal findings in urine: Secondary | ICD-10-CM | POA: Diagnosis not present

## 2017-06-17 DIAGNOSIS — I2510 Atherosclerotic heart disease of native coronary artery without angina pectoris: Secondary | ICD-10-CM | POA: Diagnosis not present

## 2017-06-17 DIAGNOSIS — I5032 Chronic diastolic (congestive) heart failure: Secondary | ICD-10-CM | POA: Diagnosis not present

## 2017-06-17 DIAGNOSIS — Z7689 Persons encountering health services in other specified circumstances: Secondary | ICD-10-CM | POA: Diagnosis not present

## 2017-06-17 DIAGNOSIS — E785 Hyperlipidemia, unspecified: Secondary | ICD-10-CM | POA: Diagnosis not present

## 2017-06-17 DIAGNOSIS — I482 Chronic atrial fibrillation: Secondary | ICD-10-CM | POA: Diagnosis not present

## 2017-06-17 DIAGNOSIS — R82992 Hyperoxaluria: Secondary | ICD-10-CM | POA: Diagnosis not present

## 2017-06-17 DIAGNOSIS — R82993 Hyperuricosuria: Secondary | ICD-10-CM | POA: Diagnosis not present

## 2017-06-17 DIAGNOSIS — Z8673 Personal history of transient ischemic attack (TIA), and cerebral infarction without residual deficits: Secondary | ICD-10-CM | POA: Diagnosis not present

## 2017-06-17 DIAGNOSIS — M6281 Muscle weakness (generalized): Secondary | ICD-10-CM | POA: Diagnosis not present

## 2017-06-17 DIAGNOSIS — Z720 Tobacco use: Secondary | ICD-10-CM | POA: Diagnosis not present

## 2017-06-17 DIAGNOSIS — J449 Chronic obstructive pulmonary disease, unspecified: Secondary | ICD-10-CM | POA: Diagnosis not present

## 2017-06-24 ENCOUNTER — Other Ambulatory Visit: Payer: Self-pay | Admitting: *Deleted

## 2017-06-24 NOTE — Patient Outreach (Signed)
[facility]) Care Management  06/24/2017  Samantha Aguirre [birth year] [id number]   Telephone assessment    Pt admitted to rehab after recent hospitalization  March 22-27,2019 for COPD, Stroke (acute CVA) with right side weakness PMHx includes but not limited to diabetes, CAD, chronic atrial fib,low back pain, hypertension, GERD, MI .   Successful outreach call to patient introduced self as newly assigned care coordinator, patient voiced understanding and recalls plan.   Patient reports feeling fairly well, she discussed her recent visit to her new PCP Dr.Hande, reports having a  good visit. .   Patient further discussed  COPD Patient discussed her breathing is about her usual ,walked to mailbox and was breathing a little heavy but got better after resting.   Recent CVA Patient reports she is not doing too bad after stroke states she got back greater than 85% function of right side . Patient reports using cane at time in home and uses walker when outside home.   Patient reports she uses [facility] for appointment and her friends helps errands and grocery shopping.   Patient is agreeable to home visit in the next week. Denies any new concerns at this time.    Plan  Will schedule home visit in the next week    [facility] CM Care Plan Problem Two     Most Recent Value  Care Plan Problem Two  COPD- recent hospitalization   Role Documenting the Problem Two  Care Management Coordinator  Care Plan for Problem Two  Active  Interventions for Problem Two Long Term Goal   Reviewed current clinical state for symptoms of COPD, reinforced taking medications as prescribed.   [facility] Long Term Goal  Pt would have no issues with COPD in the next 62 days   [facility]N Long Term Goal Start Date  06/10/17  [facility] CM Short Term Goal #1   Patient will be able to recognize at least 3 signs of worsening of COPD over the 20 days   [facility] CM Short Term Goal #1 Start Date  06/24/17  Interventions for Short  Term Goal #2   Reviewed yellow zone symptoms COPD   [facility] CM Short Term Goal #2   Patient will be able to report continued activity tolerance of usual activities over the next 20   [facility] CM Short Term Goal #2 Start Date  06/24/17  Interventions for Short Term Goal #2  Discussed continued exercise plan prescribed by home health therapy      [name], RN, [place] Management Coordinator  [phone]- Mobile [phone]- [place]

## 2017-06-25 ENCOUNTER — Other Ambulatory Visit: Payer: Self-pay | Admitting: Family Medicine

## 2017-07-06 ENCOUNTER — Encounter: Payer: Self-pay | Admitting: Emergency Medicine

## 2017-07-06 ENCOUNTER — Inpatient Hospital Stay
Admission: EM | Admit: 2017-07-06 | Discharge: 2017-07-11 | DRG: 378 | Disposition: A | Discharge status: Other Acute Inpatient Hospital | Payer: Medicare Other | Attending: Specialist | Admitting: Specialist

## 2017-07-06 ENCOUNTER — Other Ambulatory Visit: Payer: Self-pay

## 2017-07-06 ENCOUNTER — Inpatient Hospital Stay: Payer: Medicare Other

## 2017-07-06 ENCOUNTER — Emergency Department: Payer: Medicare Other

## 2017-07-06 DIAGNOSIS — Z823 Family history of stroke: Secondary | ICD-10-CM | POA: Diagnosis not present

## 2017-07-06 DIAGNOSIS — G459 Transient cerebral ischemic attack, unspecified: Secondary | ICD-10-CM

## 2017-07-06 DIAGNOSIS — Z833 Family history of diabetes mellitus: Secondary | ICD-10-CM

## 2017-07-06 DIAGNOSIS — I110 Hypertensive heart disease with heart failure: Secondary | ICD-10-CM | POA: Diagnosis present

## 2017-07-06 DIAGNOSIS — I482 Chronic atrial fibrillation: Secondary | ICD-10-CM | POA: Diagnosis present

## 2017-07-06 DIAGNOSIS — F17210 Nicotine dependence, cigarettes, uncomplicated: Secondary | ICD-10-CM | POA: Diagnosis not present

## 2017-07-06 DIAGNOSIS — K31811 Angiodysplasia of stomach and duodenum with bleeding: Principal | ICD-10-CM | POA: Diagnosis present

## 2017-07-06 DIAGNOSIS — D500 Iron deficiency anemia secondary to blood loss (chronic): Secondary | ICD-10-CM | POA: Diagnosis not present

## 2017-07-06 DIAGNOSIS — D649 Anemia, unspecified: Secondary | ICD-10-CM

## 2017-07-06 DIAGNOSIS — D6859 Other primary thrombophilia: Secondary | ICD-10-CM | POA: Diagnosis present

## 2017-07-06 DIAGNOSIS — R29703 NIHSS score 3: Secondary | ICD-10-CM | POA: Diagnosis present

## 2017-07-06 DIAGNOSIS — K31819 Angiodysplasia of stomach and duodenum without bleeding: Secondary | ICD-10-CM | POA: Diagnosis not present

## 2017-07-06 DIAGNOSIS — E039 Hypothyroidism, unspecified: Secondary | ICD-10-CM | POA: Diagnosis present

## 2017-07-06 DIAGNOSIS — D509 Iron deficiency anemia, unspecified: Secondary | ICD-10-CM | POA: Diagnosis present

## 2017-07-06 DIAGNOSIS — Z79890 Hormone replacement therapy (postmenopausal): Secondary | ICD-10-CM

## 2017-07-06 DIAGNOSIS — F40240 Claustrophobia: Secondary | ICD-10-CM | POA: Diagnosis present

## 2017-07-06 DIAGNOSIS — Z86010 Personal history of colonic polyps: Secondary | ICD-10-CM | POA: Diagnosis not present

## 2017-07-06 DIAGNOSIS — I69354 Hemiplegia and hemiparesis following cerebral infarction affecting left non-dominant side: Secondary | ICD-10-CM

## 2017-07-06 DIAGNOSIS — K922 Gastrointestinal hemorrhage, unspecified: Secondary | ICD-10-CM

## 2017-07-06 DIAGNOSIS — J449 Chronic obstructive pulmonary disease, unspecified: Secondary | ICD-10-CM | POA: Diagnosis present

## 2017-07-06 DIAGNOSIS — E7800 Pure hypercholesterolemia, unspecified: Secondary | ICD-10-CM | POA: Diagnosis present

## 2017-07-06 DIAGNOSIS — Z9049 Acquired absence of other specified parts of digestive tract: Secondary | ICD-10-CM | POA: Diagnosis not present

## 2017-07-06 DIAGNOSIS — I5032 Chronic diastolic (congestive) heart failure: Secondary | ICD-10-CM | POA: Diagnosis present

## 2017-07-06 DIAGNOSIS — D62 Acute posthemorrhagic anemia: Secondary | ICD-10-CM | POA: Diagnosis not present

## 2017-07-06 DIAGNOSIS — E538 Deficiency of other specified B group vitamins: Secondary | ICD-10-CM

## 2017-07-06 DIAGNOSIS — I639 Cerebral infarction, unspecified: Secondary | ICD-10-CM | POA: Diagnosis present

## 2017-07-06 DIAGNOSIS — M6281 Muscle weakness (generalized): Secondary | ICD-10-CM | POA: Diagnosis not present

## 2017-07-06 DIAGNOSIS — Z7984 Long term (current) use of oral hypoglycemic drugs: Secondary | ICD-10-CM

## 2017-07-06 DIAGNOSIS — J441 Chronic obstructive pulmonary disease with (acute) exacerbation: Secondary | ICD-10-CM | POA: Diagnosis not present

## 2017-07-06 DIAGNOSIS — F329 Major depressive disorder, single episode, unspecified: Secondary | ICD-10-CM | POA: Diagnosis present

## 2017-07-06 DIAGNOSIS — Z7901 Long term (current) use of anticoagulants: Secondary | ICD-10-CM

## 2017-07-06 DIAGNOSIS — I2510 Atherosclerotic heart disease of native coronary artery without angina pectoris: Secondary | ICD-10-CM | POA: Diagnosis present

## 2017-07-06 DIAGNOSIS — R29818 Other symptoms and signs involving the nervous system: Secondary | ICD-10-CM | POA: Diagnosis not present

## 2017-07-06 DIAGNOSIS — I890 Lymphedema, not elsewhere classified: Secondary | ICD-10-CM | POA: Diagnosis present

## 2017-07-06 DIAGNOSIS — I252 Old myocardial infarction: Secondary | ICD-10-CM | POA: Diagnosis not present

## 2017-07-06 DIAGNOSIS — Z66 Do not resuscitate: Secondary | ICD-10-CM | POA: Diagnosis present

## 2017-07-06 DIAGNOSIS — E785 Hyperlipidemia, unspecified: Secondary | ICD-10-CM | POA: Diagnosis present

## 2017-07-06 DIAGNOSIS — E1142 Type 2 diabetes mellitus with diabetic polyneuropathy: Secondary | ICD-10-CM | POA: Diagnosis present

## 2017-07-06 DIAGNOSIS — I5030 Unspecified diastolic (congestive) heart failure: Secondary | ICD-10-CM | POA: Diagnosis not present

## 2017-07-06 DIAGNOSIS — Z7951 Long term (current) use of inhaled steroids: Secondary | ICD-10-CM | POA: Diagnosis not present

## 2017-07-06 DIAGNOSIS — I4891 Unspecified atrial fibrillation: Secondary | ICD-10-CM | POA: Diagnosis not present

## 2017-07-06 DIAGNOSIS — Z79899 Other long term (current) drug therapy: Secondary | ICD-10-CM

## 2017-07-06 DIAGNOSIS — Z85828 Personal history of other malignant neoplasm of skin: Secondary | ICD-10-CM

## 2017-07-06 DIAGNOSIS — K219 Gastro-esophageal reflux disease without esophagitis: Secondary | ICD-10-CM | POA: Diagnosis present

## 2017-07-06 DIAGNOSIS — E1151 Type 2 diabetes mellitus with diabetic peripheral angiopathy without gangrene: Secondary | ICD-10-CM | POA: Diagnosis not present

## 2017-07-06 DIAGNOSIS — R4182 Altered mental status, unspecified: Secondary | ICD-10-CM | POA: Diagnosis not present

## 2017-07-06 DIAGNOSIS — R29810 Facial weakness: Secondary | ICD-10-CM | POA: Diagnosis present

## 2017-07-06 DIAGNOSIS — R531 Weakness: Secondary | ICD-10-CM | POA: Diagnosis not present

## 2017-07-06 LAB — URINE DRUG SCREEN, QUALITATIVE (ARMC ONLY)
Amphetamines, Ur Screen: NOT DETECTED
BENZODIAZEPINE, UR SCRN: NOT DETECTED
CANNABINOID 50 NG, UR ~~LOC~~: NOT DETECTED
Cocaine Metabolite,Ur ~~LOC~~: NOT DETECTED
MDMA (Ecstasy)Ur Screen: NOT DETECTED
Methadone Scn, Ur: NOT DETECTED
OPIATE, UR SCREEN: NOT DETECTED
PHENCYCLIDINE (PCP) UR S: NOT DETECTED
Tricyclic, Ur Screen: NOT DETECTED

## 2017-07-06 LAB — CBC WITH DIFFERENTIAL/PLATELET
BASOS ABS: 0.1 10*3/uL (ref 0–0.1)
Basophils Relative: 1 %
EOS PCT: 2 %
Eosinophils Absolute: 0.1 10*3/uL (ref 0–0.7)
HCT: 21.8 % — ABNORMAL LOW (ref 35.0–47.0)
Hemoglobin: 6.7 g/dL — ABNORMAL LOW (ref 12.0–16.0)
Lymphocytes Relative: 23 %
Lymphs Abs: 1.3 10*3/uL (ref 1.0–3.6)
MCH: 21.4 pg — AB (ref 26.0–34.0)
MCHC: 30.8 g/dL — ABNORMAL LOW (ref 32.0–36.0)
MCV: 69.4 fL — ABNORMAL LOW (ref 80.0–100.0)
Monocytes Absolute: 0.5 10*3/uL (ref 0.2–0.9)
Monocytes Relative: 8 %
Neutro Abs: 3.9 10*3/uL (ref 1.4–6.5)
Neutrophils Relative %: 66 %
PLATELETS: 314 10*3/uL (ref 150–440)
RBC: 3.14 MIL/uL — AB (ref 3.80–5.20)
RDW: 22.7 % — ABNORMAL HIGH (ref 11.5–14.5)
WBC: 5.9 10*3/uL (ref 3.6–11.0)

## 2017-07-06 LAB — COMPREHENSIVE METABOLIC PANEL
ALBUMIN: 3.2 g/dL — AB (ref 3.5–5.0)
ALK PHOS: 88 U/L (ref 38–126)
ALT: 10 U/L — AB (ref 14–54)
AST: 20 U/L (ref 15–41)
Anion gap: 12 (ref 5–15)
BUN: 11 mg/dL (ref 6–20)
CHLORIDE: 105 mmol/L (ref 101–111)
CO2: 23 mmol/L (ref 22–32)
CREATININE: 0.42 mg/dL — AB (ref 0.44–1.00)
Calcium: 8.1 mg/dL — ABNORMAL LOW (ref 8.9–10.3)
GFR calc non Af Amer: >60 mL/min (ref >60)
GLUCOSE: 156 mg/dL — AB (ref 65–99)
Potassium: 2.7 mmol/L — CL (ref 3.5–5.1)
SODIUM: 140 mmol/L (ref 135–145)
TOTAL PROTEIN: 5.9 g/dL — AB (ref 6.5–8.1)
Total Bilirubin: 0.5 mg/dL (ref 0.3–1.2)

## 2017-07-06 LAB — PREPARE RBC (CROSSMATCH)

## 2017-07-06 LAB — URINALYSIS, ROUTINE W REFLEX MICROSCOPIC
BILIRUBIN URINE: NEGATIVE
GLUCOSE, UA: NEGATIVE mg/dL
HGB URINE DIPSTICK: NEGATIVE
Ketones, ur: NEGATIVE mg/dL
Leukocytes, UA: NEGATIVE
Nitrite: NEGATIVE
Protein, ur: NEGATIVE mg/dL
SPECIFIC GRAVITY, URINE: 1.017 (ref 1.005–1.030)
pH: 6 (ref 5.0–8.0)

## 2017-07-06 LAB — APTT: APTT: 28 s (ref 24–36)

## 2017-07-06 LAB — POTASSIUM: Potassium: 3.3 mmol/L — ABNORMAL LOW (ref 3.5–5.1)

## 2017-07-06 LAB — PROTIME-INR
INR: 1.16
PROTHROMBIN TIME: 14.7 s (ref 11.4–15.2)

## 2017-07-06 LAB — MAGNESIUM: MAGNESIUM: 1.7 mg/dL (ref 1.7–2.4)

## 2017-07-06 LAB — GLUCOSE, CAPILLARY
Glucose-Capillary: 150 mg/dL — ABNORMAL HIGH (ref 65–99)
Glucose-Capillary: 106 mg/dL — ABNORMAL HIGH (ref 65–99)
Glucose-Capillary: 170 mg/dL — ABNORMAL HIGH (ref 65–99)

## 2017-07-06 LAB — ETHANOL

## 2017-07-06 LAB — TROPONIN I: Troponin I: <0.03 ng/mL (ref <0.03)

## 2017-07-06 LAB — VITAMIN B12: Vitamin B-12: 504 pg/mL (ref 180–914)

## 2017-07-06 LAB — PHOSPHORUS: Phosphorus: 3.8 mg/dL (ref 2.5–4.6)

## 2017-07-06 MED ORDER — SODIUM CHLORIDE 0.9 % IV SOLN: Freq: Once | INTRAVENOUS | Status: DC

## 2017-07-06 MED ORDER — FERROUS SULFATE 325 (65 FE) MG PO TABS
325.0000 mg | ORAL_TABLET | ORAL | Status: DC
  Administered 2017-07-07 – 2017-07-09 (×2): 325 mg via ORAL
  Filled 2017-07-06 (×2): qty 1

## 2017-07-06 MED ORDER — VITAMIN B-12 1000 MCG PO TABS
1000.0000 ug | ORAL_TABLET | Freq: Every day | ORAL | Status: DC
  Administered 2017-07-07 – 2017-07-11 (×4): 1000 ug via ORAL
  Filled 2017-07-06 (×4): qty 1

## 2017-07-06 MED ORDER — ALBUTEROL SULFATE (2.5 MG/3ML) 0.083% IN NEBU: 2.5000 mg | INHALATION_SOLUTION | Freq: Four times a day (QID) | RESPIRATORY_TRACT | Status: DC | PRN

## 2017-07-06 MED ORDER — GLIPIZIDE-METFORMIN HCL 5-500 MG PO TABS: 1.0000 | ORAL_TABLET | Freq: Two times a day (BID) | ORAL | Status: DC

## 2017-07-06 MED ORDER — LEVETIRACETAM 500 MG PO TABS
500.0000 mg | ORAL_TABLET | Freq: Two times a day (BID) | ORAL | Status: DC
  Administered 2017-07-06 – 2017-07-11 (×9): 500 mg via ORAL
  Filled 2017-07-06 (×13): qty 1

## 2017-07-06 MED ORDER — GLIPIZIDE 5 MG PO TABS
5.0000 mg | ORAL_TABLET | Freq: Two times a day (BID) | ORAL | Status: DC
  Administered 2017-07-06 – 2017-07-11 (×6): 5 mg via ORAL
  Filled 2017-07-06 (×11): qty 1

## 2017-07-06 MED ORDER — PANTOPRAZOLE SODIUM 40 MG PO TBEC
40.0000 mg | DELAYED_RELEASE_TABLET | Freq: Every day | ORAL | Status: DC
  Administered 2017-07-06 – 2017-07-11 (×5): 40 mg via ORAL
  Filled 2017-07-06 (×5): qty 1

## 2017-07-06 MED ORDER — DULOXETINE HCL 30 MG PO CPEP
60.0000 mg | ORAL_CAPSULE | Freq: Every day | ORAL | Status: DC
  Administered 2017-07-06 – 2017-07-10 (×5): 60 mg via ORAL
  Filled 2017-07-06 (×5): qty 2

## 2017-07-06 MED ORDER — LORAZEPAM 2 MG/ML IJ SOLN
1.0000 mg | Freq: Once | INTRAMUSCULAR | Status: AC
  Administered 2017-07-06: 1 mg via INTRAVENOUS
  Filled 2017-07-06: qty 1

## 2017-07-06 MED ORDER — NITROGLYCERIN 0.4 MG SL SUBL: 0.4000 mg | SUBLINGUAL_TABLET | SUBLINGUAL | Status: DC | PRN

## 2017-07-06 MED ORDER — ALBUTEROL SULFATE HFA 108 (90 BASE) MCG/ACT IN AERS: 2.0000 | INHALATION_SPRAY | Freq: Four times a day (QID) | RESPIRATORY_TRACT | Status: DC | PRN

## 2017-07-06 MED ORDER — POTASSIUM CHLORIDE CRYS ER 20 MEQ PO TBCR
40.0000 meq | EXTENDED_RELEASE_TABLET | Freq: Once | ORAL | Status: AC
  Administered 2017-07-06: 14:00:00 40 meq via ORAL
  Filled 2017-07-06: qty 2

## 2017-07-06 MED ORDER — ATORVASTATIN CALCIUM 20 MG PO TABS
80.0000 mg | ORAL_TABLET | Freq: Every day | ORAL | Status: DC
  Administered 2017-07-06 – 2017-07-10 (×5): 80 mg via ORAL
  Filled 2017-07-06 (×5): qty 4

## 2017-07-06 MED ORDER — ASPIRIN 325 MG PO TABS
325.0000 mg | ORAL_TABLET | Freq: Every day | ORAL | Status: DC
  Administered 2017-07-07 – 2017-07-11 (×3): 325 mg via ORAL
  Filled 2017-07-06 (×6): qty 1

## 2017-07-06 MED ORDER — FLUTICASONE PROPIONATE 50 MCG/ACT NA SUSP
1.0000 | Freq: Every day | NASAL | Status: DC
  Administered 2017-07-06 – 2017-07-11 (×5): 1 via NASAL
  Filled 2017-07-06: qty 16

## 2017-07-06 MED ORDER — POTASSIUM CHLORIDE CRYS ER 20 MEQ PO TBCR
20.0000 meq | EXTENDED_RELEASE_TABLET | Freq: Three times a day (TID) | ORAL | Status: DC
  Administered 2017-07-06 – 2017-07-07 (×5): 20 meq via ORAL
  Filled 2017-07-06 (×5): qty 1

## 2017-07-06 MED ORDER — LISINOPRIL 20 MG PO TABS
20.0000 mg | ORAL_TABLET | Freq: Every day | ORAL | Status: DC
  Administered 2017-07-06 – 2017-07-09 (×4): 20 mg via ORAL
  Filled 2017-07-06 (×6): qty 1

## 2017-07-06 MED ORDER — ENOXAPARIN SODIUM 40 MG/0.4ML ~~LOC~~ SOLN
40.0000 mg | SUBCUTANEOUS | Status: DC
  Administered 2017-07-06 – 2017-07-07 (×2): 40 mg via SUBCUTANEOUS
  Filled 2017-07-06 (×2): qty 0.4

## 2017-07-06 MED ORDER — GABAPENTIN 400 MG PO CAPS
800.0000 mg | ORAL_CAPSULE | Freq: Two times a day (BID) | ORAL | Status: DC
  Administered 2017-07-06 – 2017-07-11 (×9): 800 mg via ORAL
  Filled 2017-07-06: qty 8
  Filled 2017-07-06 (×3): qty 2
  Filled 2017-07-06: qty 8
  Filled 2017-07-06 (×3): qty 2
  Filled 2017-07-06: qty 8
  Filled 2017-07-06 (×2): qty 2
  Filled 2017-07-06: qty 8
  Filled 2017-07-06 (×2): qty 2
  Filled 2017-07-06 (×2): qty 8
  Filled 2017-07-06 (×3): qty 2
  Filled 2017-07-06: qty 8
  Filled 2017-07-06: qty 2

## 2017-07-06 MED ORDER — ACETAMINOPHEN 325 MG PO TABS
650.0000 mg | ORAL_TABLET | Freq: Four times a day (QID) | ORAL | Status: DC | PRN
  Administered 2017-07-06 – 2017-07-10 (×2): 650 mg via ORAL
  Filled 2017-07-06 (×2): qty 2

## 2017-07-06 MED ORDER — SODIUM CHLORIDE 0.9% IV SOLUTION
Freq: Once | INTRAVENOUS | Status: AC
  Administered 2017-07-06: 17:00:00 via INTRAVENOUS

## 2017-07-06 MED ORDER — INSULIN ASPART 100 UNIT/ML ~~LOC~~ SOLN
0.0000 [IU] | Freq: Three times a day (TID) | SUBCUTANEOUS | Status: DC
  Administered 2017-07-06: 2 [IU] via SUBCUTANEOUS
  Administered 2017-07-07 – 2017-07-09 (×3): 1 [IU] via SUBCUTANEOUS
  Administered 2017-07-10: 2 [IU] via SUBCUTANEOUS
  Filled 2017-07-06 (×5): qty 1

## 2017-07-06 MED ORDER — METFORMIN HCL 500 MG PO TABS
500.0000 mg | ORAL_TABLET | Freq: Two times a day (BID) | ORAL | Status: DC
  Administered 2017-07-06 – 2017-07-11 (×6): 500 mg via ORAL
  Filled 2017-07-06 (×11): qty 1

## 2017-07-06 MED ORDER — MAGNESIUM SULFATE 2 GM/50ML IV SOLN
2.0000 g | Freq: Once | INTRAVENOUS | Status: AC
  Administered 2017-07-06: 14:00:00 2 g via INTRAVENOUS
  Filled 2017-07-06: qty 50

## 2017-07-06 MED ORDER — FERROUS SULFATE 325 (65 FE) MG PO TBEC: 325.0000 mg | DELAYED_RELEASE_TABLET | Freq: Two times a day (BID) | ORAL | Status: DC

## 2017-07-06 MED ORDER — POTASSIUM CHLORIDE 20 MEQ PO PACK
40.0000 meq | PACK | Freq: Once | ORAL | Status: AC
  Administered 2017-07-06: 40 meq via ORAL
  Filled 2017-07-06: qty 2

## 2017-07-06 MED ORDER — GABAPENTIN 800 MG PO TABS
800.0000 mg | ORAL_TABLET | Freq: Two times a day (BID) | ORAL | Status: DC
  Filled 2017-07-06 (×2): qty 1

## 2017-07-06 MED ORDER — FUROSEMIDE 20 MG PO TABS
10.0000 mg | ORAL_TABLET | Freq: Every day | ORAL | Status: DC
Start: 2017-07-06 — End: 2017-07-11
  Administered 2017-07-06 – 2017-07-09 (×3): 10 mg via ORAL
  Filled 2017-07-06 (×6): qty 1

## 2017-07-06 MED ORDER — ASPIRIN 81 MG PO CHEW
324.0000 mg | CHEWABLE_TABLET | Freq: Once | ORAL | Status: AC
  Administered 2017-07-06: 324 mg via ORAL
  Filled 2017-07-06: qty 4

## 2017-07-06 MED ORDER — STROKE: EARLY STAGES OF RECOVERY BOOK
Freq: Once | Status: AC
  Administered 2017-07-06: 18:00:00

## 2017-07-06 MED ORDER — ASPIRIN 300 MG RE SUPP
300.0000 mg | Freq: Every day | RECTAL | Status: DC
  Filled 2017-07-06: qty 1

## 2017-07-06 MED ORDER — LEVOTHYROXINE SODIUM 112 MCG PO TABS
112.0000 ug | ORAL_TABLET | Freq: Every day | ORAL | Status: DC
  Administered 2017-07-07 – 2017-07-11 (×3): 112 ug via ORAL
  Filled 2017-07-06 (×5): qty 1

## 2017-07-06 NOTE — Progress Notes (Signed)
CODE STROKE- PHARMACY COMMUNICATION   Time CODE STROKE called/page received:0936  Time response to CODE STROKE was made (in person or via phone): 0945; patient outside of TPN window   Time Stroke Kit retrieved from [place] (only if needed):N/A  Name of Provider/Nurse contacted:Olivia Broomer  Past Medical History:  Diagnosis Date  . Acid reflux   . Arthritis    Bilateral knees, hands, feet  . Asthma   . Atrial fibrillation ([place])   . Cancer ([place])   . Chest pain   . CHF (congestive heart failure) (HCC)    Diastolic CHF  . COPD (chronic obstructive pulmonary disease) ([place])   . Coronary artery disease   . Diabetes mellitus without complication ([place])   . Frequent headaches   . Heart attack ([place])   . High cholesterol   . HLD (hyperlipidemia)   . Hypertension   . MI (myocardial infarction) ([place])   . Seizures ([place])   . Skin cancer 2015   Suspected basal cell carcinoma on nose, surgically removed  . Stroke ([place])   . Thyroid disease   . Tremors of nervous system    Prior to Admission medications   Medication Sig Start Date End Date Taking? Authorizing Provider  albuterol (PROVENTIL HFA;VENTOLIN HFA) 108 (90 Base) MCG/ACT inhaler Inhale 2 puffs into the lungs every 6 (six) hours as needed for wheezing or shortness of breath. 10/08/16   Karamalegos, Devonne Doughty, DO  apixaban (ELIQUIS) 5 MG TABS tablet Take 5 mg by mouth every 12 (twelve) hours.    [provider]  atorvastatin (LIPITOR) 80 MG tablet Take 1 tablet (80 mg total) by mouth daily at 6 PM. 09/24/16   Karamalegos, Devonne Doughty, DO  diclofenac sodium (VOLTAREN) 1 % GEL Apply 2 g topically 4 (four) times daily. 01/11/16   Daymon Larsen, MD  DULoxetine (CYMBALTA) 60 MG capsule Take 1 capsule (60 mg total) by mouth at bedtime. 09/24/16   Karamalegos, Devonne Doughty, DO  ferrous sulfate 325 (65 FE) MG EC tablet Take 1 tablet (325 mg total) by mouth 2 (two) times daily with a meal. Patient not taking: Reported on 05/19/2017  02/21/17   Hillary Bow, MD  fluticasone (FLONASE) 50 MCG/ACT nasal spray Place 1 spray into both nostrils daily. 10/08/16   Karamalegos, Alexander J, DO  fluticasone (FLOVENT HFA) 220 MCG/ACT inhaler Inhale 1 puff into the lungs 2 (two) times daily. Patient not taking: Reported on 05/19/2017 04/02/17   Loletha Grayer, MD  furosemide (LASIX) 20 MG tablet Take 10 mg by mouth daily.     Karamalegos, Alexander J, DO  gabapentin (NEURONTIN) 800 MG tablet Take 1 tablet (800 mg total) by mouth 2 (two) times daily. 09/24/16   Karamalegos, Devonne Doughty, DO  glipiZIDE-metformin (METAGLIP) 5-500 MG tablet Take 1 tablet by mouth 2 (two) times daily before a meal. 04/02/17   Karamalegos, Alexander J, DO  guaiFENesin-dextromethorphan (ROBITUSSIN DM) 100-10 MG/5ML syrup Take 5 mLs by mouth every 4 (four) hours as needed for cough. Patient not taking: Reported on 05/19/2017 04/07/17   Demetrios Loll, MD  levETIRAcetam (KEPPRA) 500 MG tablet Take 1 tablet (500 mg total) by mouth 2 (two) times daily. 09/19/15   Epifanio Lesches, MD  levothyroxine (SYNTHROID, LEVOTHROID) 175 MCG tablet Take 1 tablet (175 mcg total) by mouth daily before breakfast. 09/24/16   Karamalegos, Devonne Doughty, DO  lisinopril (PRINIVIL,ZESTRIL) 20 MG tablet Take 1 tablet (20 mg total) by mouth daily. 10/08/16   Olin Hauser, DO  MAPAP 325  MG tablet TAKE 2 TABLETS BY MOUTH TWICE DAILY Patient not taking: Reported on 05/19/2017 04/27/17   Olin Hauser, DO  nitroGLYCERIN (NITROSTAT) 0.4 MG SL tablet Place 1 tablet (0.4 mg total) under the tongue every 5 (five) minutes as needed for chest pain. Patient not taking: Reported on 05/19/2017 02/28/16   Bettey Costa, MD  omeprazole (PRILOSEC) 20 MG capsule Take 20 mg by mouth 2 (two) times daily.    [provider]  potassium chloride (MICRO-K) 10 MEQ CR capsule Take 1 capsule (10 mEq total) by mouth daily. 09/24/16   Karamalegos, Devonne Doughty, DO  predniSONE (DELTASONE) 10 MG tablet 40 mg  po daily 1 day, 30 mg po daily 1 day,20 mg po daily 1 day, 10 mg po daily 1 day. Patient not taking: Reported on 05/19/2017 04/08/17   Demetrios Loll, MD  sucralfate (CARAFATE) 1 g tablet TAKE (1) TABLET BY MOUTH FOUR TIMES A DAY 03/26/17   Karamalegos, Devonne Doughty, DO  SUMAtriptan (IMITREX) 25 MG tablet Take 1 tablet (25 mg total) by mouth once as needed for up to 1 dose for migraine. May repeat one dose in 2 hours if persists, max in 24 hrs Patient not taking: Reported on 05/19/2017 02/13/17   Olin Hauser, DO  umeclidinium bromide (INCRUSE ELLIPTA) 62.5 MCG/INH AEPB Inhale 1 puff into the lungs daily. 11/13/15   Olin Hauser, DO    Charlett Nose ,PharmD Clinical Pharmacist  07/06/2017  9:50 AM

## 2017-07-06 NOTE — H&P (Addendum)
[facility] [place] NAME: Samantha Aguirre    MR#:  [id number]  DATE OF BIRTH:  [birth year]  DATE OF ADMISSION:  07/06/2017  PRIMARY CARE PHYSICIAN: Tracie Harrier, MD   REQUESTING/REFERRING PHYSICIAN: dr Rip Harbour  CHIEF COMPLAINT:  left upper and lower extremity weakness with left facial drool since 9 o'clock yesterday  HISTORY OF PRESENT ILLNESS:  Samantha Aguirre  is a 72 y.o. female with a known history of recurrent CVA, atrial fibrillation on eliquis, severe iron deficiency anemia with local G.I. bleeding as for workup with colonoscopy and endoscopy in February 2019 on iron, vitamin B12 deficiency, COPD with ongoing tobacco abuse, coronary artery disease comes to the emergency room with complaints of left upper and lower extremity weakness since last night. Patient normally uses a walker and a cane. Denies any fall. Lives at home by herself. Children visit her.  Continues to smoke heavy. CT head negative for acute CVA. Patient going for MRI brain today.  She had acute right thalamic infarct in March 2019. She was continued on eliquis  In the ER patient received aspirin 325 mg. Tele neurology recommends hold eliquis for now. hgb today is 6.7. Denies any black stools.  sHe had colonoscopy in February 2019 10 polyps anywhere from 7 to 10 mm were removed. G.I. felt that was the source of the bleeding given her being on eliquis. She also had EGD which was essentially negative in February 2019  hgb with Dr Ginette Pitman was 7.7 and hct 77 on June  6th 2019 Vitamin b12 down to 203  pt is being admitted for further voucher management PAST MEDICAL HISTORY:   Past Medical History:  Diagnosis Date  . Acid reflux   . Arthritis    Bilateral knees, hands, feet  . Asthma   . Atrial fibrillation ([place])   . Cancer ([place])   . Chest pain   . CHF (congestive heart failure) (H[facility])    Diastolic CHF  . COPD (chronic obstructive pulmonary disease)  ([place])   . Coronary artery disease   . Diabetes mellitus without complication ([place])   . Frequent headaches   . Heart attack ([place])   . High cholesterol   . HLD (hyperlipidemia)   . Hypertension   . MI (myocardial infarction) ([place])   . Seizures ([place])   . Skin cancer 2015   Suspected basal cell carcinoma on nose, surgically removed  . Stroke ([place])   . Thyroid disease   . Tremors of nervous system     PAST SURGICAL HISTOIRY:   Past Surgical History:  Procedure Laterality Date  . ABDOMINAL HYSTERECTOMY  1976  . APPENDECTOMY  1980  . CAROTID ENDARTERECTOMY    . CHOLECYSTECTOMY  1980  . COLONOSCOPY N/A 02/20/2017   Procedure: COLONOSCOPY;  Surgeon: Lin Landsman, MD;  [facility];  Service: Gastroenterology;  Laterality: N/A;  . ESOPHAGOGASTRODUODENOSCOPY N/A 02/20/2017   Procedure: ESOPHAGOGASTRODUODENOSCOPY (EGD);  Surgeon: Lin Landsman, MD;  Location: [facility] ENDOSCOPY;  Service: Gastroenterology;  Laterality: N/A;  . [place]  . KNEE SURGERY     left  . ROTATOR CUFF REPAIR     right  . TONSILLECTOMY  1950    SOCIAL HISTORY:   Social History   Tobacco Use  . Smoking status: Current Every Day Smoker    Packs/day: 1.00    Years: 50.00    Pack years: 50.00    Types: Cigarettes  . Smokeless tobacco: Never Used  Substance Use Topics  . Alcohol use: No    FAMILY HISTORY:   Family History  Problem Relation Age of Onset  . Breast cancer Mother   . Heart disease Mother   . Stroke Mother   . Cancer Mother   . COPD Mother   . Diabetes Mother   . Heart disease Father   . Diabetes Father   . Stroke Father   . Alcohol abuse Sister   . Drug abuse Sister   . Stroke Sister   . Cancer Sister   . Mental illness Sister   . Heart disease Brother   . Arthritis Brother   . Diabetes Brother   . Heart disease Maternal Grandfather   . Heart disease Paternal Grandfather     DRUG ALLERGIES:   Allergies  Allergen Reactions  . Mushroom  Extract Complex Anaphylaxis    Made patient "deathly sick"  . Atenolol     "MD took me off of it bc it was doing something wrong" Made patient HYPOTENSIVE  . Ivp Dye [Iodinated Diagnostic Agents] Itching    Pt injected with IV contrast.  5 min after injection pt complained of itching behind ear and on her abd.  . Morphine And Related Other (See Comments)    "stops my breathing" NEAR-RESPIRATORY ARREST  . Percocet [Oxycodone-Acetaminophen] Nausea And Vomiting and Other (See Comments)    Stomach pains  . Percodan [Oxycodone-Aspirin] Nausea And Vomiting and Other (See Comments)    Stomach pains  . Verapamil Other (See Comments)    " MD told me this was screwing me up" MAKES PATIENT HYPOTENSIVE    REVIEW OF SYSTEMS:  Review of Systems  Constitutional: Positive for malaise/fatigue. Negative for chills, fever and weight loss.  HENT: Negative for ear discharge, ear pain and nosebleeds.   Eyes: Negative for blurred vision, pain and discharge.  Respiratory: Negative for sputum production, shortness of breath, wheezing and stridor.   Cardiovascular: Negative for chest pain, palpitations, orthopnea and PND.  Gastrointestinal: Negative for abdominal pain, diarrhea, nausea and vomiting.  Genitourinary: Negative for frequency and urgency.  Musculoskeletal: Negative for back pain and joint pain.  Neurological: Positive for focal weakness and weakness. Negative for sensory change and speech change.  Psychiatric/Behavioral: Negative for depression and hallucinations. The patient is not nervous/anxious.      MEDICATIONS AT HOME:   Prior to Admission medications   Medication Sig Start Date End Date Taking? Authorizing Provider  albuterol (PROVENTIL HFA;VENTOLIN HFA) 108 (90 Base) MCG/ACT inhaler Inhale 2 puffs into the lungs every 6 (six) hours as needed for wheezing or shortness of breath. 10/08/16  Yes Karamalegos, Devonne Doughty, DO  apixaban (ELIQUIS) 5 MG TABS tablet Take 5 mg by mouth every 12  (twelve) hours.   Yes [provider]  atorvastatin (LIPITOR) 80 MG tablet Take 1 tablet (80 mg total) by mouth daily at 6 PM. 09/24/16  Yes Karamalegos, Devonne Doughty, DO  diclofenac sodium (VOLTAREN) 1 % GEL Apply 2 g topically 4 (four) times daily. 01/11/16  Yes Daymon Larsen, MD  DULoxetine (CYMBALTA) 60 MG capsule Take 1 capsule (60 mg total) by mouth at bedtime. 09/24/16  Yes Karamalegos, Devonne Doughty, DO  ferrous sulfate 325 (65 FE) MG EC tablet Take 1 tablet (325 mg total) by mouth 2 (two) times daily with a meal. 02/21/17  Yes Sudini, Srikar, MD  fluticasone (FLONASE) 50 MCG/ACT nasal spray Place 1 spray into both nostrils daily. 10/08/16  Yes Karamalegos, Devonne Doughty, DO  furosemide (  LASIX) 20 MG tablet Take 10 mg by mouth daily.    Yes Karamalegos, Devonne Doughty, DO  gabapentin (NEURONTIN) 800 MG tablet Take 1 tablet (800 mg total) by mouth 2 (two) times daily. 09/24/16  Yes Karamalegos, Devonne Doughty, DO  glipiZIDE-metformin (METAGLIP) 5-500 MG tablet Take 1 tablet by mouth 2 (two) times daily before a meal. 04/02/17  Yes Karamalegos, Devonne Doughty, DO  levETIRAcetam (KEPPRA) 500 MG tablet Take 1 tablet (500 mg total) by mouth 2 (two) times daily. 09/19/15  Yes Epifanio Lesches, MD  levothyroxine (SYNTHROID, LEVOTHROID) 175 MCG tablet Take 1 tablet (175 mcg total) by mouth daily before breakfast. Patient taking differently: Take 112 mcg by mouth daily before breakfast.  09/24/16  Yes Karamalegos, Devonne Doughty, DO  lisinopril (PRINIVIL,ZESTRIL) 20 MG tablet Take 1 tablet (20 mg total) by mouth daily. 10/08/16  Yes Karamalegos, Devonne Doughty, DO  nitroGLYCERIN (NITROSTAT) 0.4 MG SL tablet Place 1 tablet (0.4 mg total) under the tongue every 5 (five) minutes as needed for chest pain. 02/28/16  Yes Mody, Ulice Bold, MD  omeprazole (PRILOSEC) 20 MG capsule Take 20 mg by mouth 2 (two) times daily.   Yes [provider]  potassium chloride (MICRO-K) 10 MEQ CR capsule Take 1 capsule (10 mEq total) by  mouth daily. Patient taking differently: Take 10 mEq by mouth 2 (two) times daily.  09/24/16  Yes Karamalegos, Devonne Doughty, DO  sucralfate (CARAFATE) 1 g tablet TAKE (1) TABLET BY MOUTH FOUR TIMES A DAY 03/26/17  Yes Karamalegos, Devonne Doughty, DO  umeclidinium bromide (INCRUSE ELLIPTA) 62.5 MCG/INH AEPB Inhale 1 puff into the lungs daily. 11/13/15  Yes Karamalegos, Devonne Doughty, DO  vitamin B-12 (CYANOCOBALAMIN) 1000 MCG tablet Take 1 tablet by mouth daily. 06/29/17  Yes [provider]      VITAL SIGNS:  Blood pressure 121/61, pulse 70, temperature 98 F (36.7 C), temperature source Oral, resp. rate 16, height 5\' 1"  (1.549 m), weight 80.3 kg (177 lb), SpO2 100 %.  PHYSICAL EXAMINATION:  GENERAL:  72 y.o.-year-old patient lying in the bed with no acute distress. Unkempt condition EYES: Pupils equal, round, reactive to light and accommodation. No scleral icterus. Extraocular muscles intact.  HEENT: Head atraumatic, normocephalic. Oropharynx and nasopharynx clear. Pallor+++ NECK:  Supple, no jugular venous distention. No thyroid enlargement, no tenderness.  LUNGS: Normal breath sounds bilaterally, no wheezing, rales,rhonchi or crepitation. No use of accessory muscles of respiration.  CARDIOVASCULAR: S1, S2 normal. No murmurs, rubs, or gallops.  ABDOMEN: Soft, nontender, nondistended. Bowel sounds present. No organomegaly or mass.  EXTREMITIES: No pedal edema, cyanosis, or clubbing.  NEUROLOGIC: Cranial nerves II through XII are intact. Left upper and lower extremity weakness 4/5. Sensation intact. Gait not checked.  PSYCHIATRIC: The patient is alert and oriented x 3.  SKIN: No obvious rash, lesion, or ulcer.   LABORATORY PANEL:   CBC Recent Labs  Lab 07/06/17 0936  WBC 5.9  HGB 6.7*  HCT 21.8*  PLT 314   ------------------------------------------------------------------------------------------------------------------  Chemistries  Recent Labs  Lab 07/06/17 0936  NA 140  K  2.7*  CL 105  CO2 23  GLUCOSE 156*  BUN 11  CREATININE 0.42*  CALCIUM 8.1*  AST 20  ALT 10*  ALKPHOS 88  BILITOT 0.5   ------------------------------------------------------------------------------------------------------------------  Cardiac Enzymes Recent Labs  Lab 07/06/17 0936  TROPONINI <0.03   ------------------------------------------------------------------------------------------------------------------  RADIOLOGY:  Ct Head Wo Contrast  Result Date: 07/06/2017 CLINICAL DATA:  Slight facial droop and weakness since last night at 9  p.m. EXAM: CT HEAD WITHOUT CONTRAST TECHNIQUE: Contiguous axial images were obtained from the base of the skull through the vertex without intravenous contrast. COMPARISON:  04/05/2017 FINDINGS: Brain: No evidence of acute infarction, hemorrhage, hydrocephalus, extra-axial collection or mass lesion/mass effect. Moderate remote left MCA territory infarct centered in the left parietal and posterior temporal lobes. Patchy low-density in the cerebral white matter from chronic small vessel ischemia. Small remote bilateral cerebellar infarcts. Vascular: No hyperdense vessel. Skull: No acute finding Sinuses/Orbits: Negative Other: These results were called by telephone at the time of interpretation on 07/06/2017 at 9:56 am to Dr. Conni Slipper , who verbally acknowledged these results. IMPRESSION: 1. No acute finding. 2. Chronic ischemic injury is described above. Electronically Signed   By: Monte Fantasia M.D.   On: 07/06/2017 09:58    EKG:    IMPRESSION AND PLAN:   Samantha Aguirre  is a 72 y.o. female with a known history of recurrent CVA, atrial fibrillation on eliquis, severe iron deficiency anemia with local G.I. bleeding as for workup with colonoscopy and endoscopy in February 2019 on iron, vitamin B12 deficiency, COPD with ongoing tobacco abuse, coronary artery disease comes to the emergency room with complaints of left upper and lower extremity  weakness since last night. Patient normally uses a walker and a cane  1. left upper and lower extremity weakness with left facial drool -has had history of stroke in the past most recent was right colonic stroke with similar symptoms of left upper and lower extremity weakness in March 2019 -admit to medical floor with off unit telemonitoring -continue aspirin -holding eliquis per tele neurology recommendation. -Neurology consultation -MRI of the brain today -CT had negative -had echo, ultrasound carotid Doppler without any significant stenosis in March 2019. I will not repeat it. -PT, OT, speech therapy  2. severe microcytic anemia -patient's hemoglobin has drifted from 11.0 in March 2019----7.7 (out pt June 2019)--6.7 today -is on eliquis for chronic a fib--- holding it -patient also has vitamin B12 deficiency -will transfuse one unit of blood transfusion -sHe had colonoscopy in February 2019 10 polyps anywhere from 7 to 10 mm were removed. G.I. felt that was the source of the bleeding given her being on eliquis. She also had EGD which was essentially negative in February 2019 -going to place hematology consultation for IV iron therapy -G.I. consultation if needed --send stools for occult blood  3.CO PD with ongoing tobacco abuse -sats remain stable -patient not motivated to quit smoking  4. Hypertension -resume home meds from tomorrow  5. Hyperlipidemia on statins  6. Chronic atrial fibrillation -rate controlled. -Holding eliquis..... Need to discuss with cardiology if this can be discontinued since patient is likely having a local G.I. Bleeding -her cardiologist is Dr. Clayborn Bigness  7. Dm-2 SSI and cont home meds  No family in the ER    All the records are reviewed and case discussed with ED provider. Management plans discussed with the patient, family and they are in agreement.  CODE STATUS: DNR  TOTAL TIME TAKING CARE OF THIS PATIENT: *50* minutes.    Fritzi Mandes  M.D on 07/06/2017 at 11:28 AM  Between 7am to 6pm - Pager - [phone]  After 6pm go to [email or web address] - password [email or web address] [facility]  [facility]  Office  [phone]  CC: Primary care physician; Tracie Harrier, MD

## 2017-07-06 NOTE — Consult Note (Signed)
   [facility] TeleNeurology Consult Services  Date of service: 07/06/17  Impression:  acute left facial droop and slurred speech - NIHSS 2. Concerning for R frontal/subcortical infarct.  - - -   Not a tpa candidate due to: eliquis; LKN >4.5 hours.  Presentation is not suggestive of Large Vessel Occlusive Disease. Thrombectomy would not be recommended.   Differential Diagnosis:   1. Cardioembolic stroke  2. Small vessel disease/lacune  3. Thromboembolic, artery-to-artery mechanism  4. Hypercoagulable state-related infarct  5. Transient ischemic attack  6. Thrombotic mechanism, large artery disease   Comments:   Door time: 0929 [facility] contacted: [phone] [facility] at bedside: 0956 NIHSS assessment time: 1000  Recommendations:  tele ASA if CT head is neg for hemorrhage; hold eliquis pending MRI results.  DVT proph - lovenox permissive htn PT/OT/speech bedside swallow eval  Inpatient neurology consultation Inpatient stroke evaluation as per Neurology/ Internal Medicine Discussed with ED MD Please call with questions  -----------------------------------------------------------------------------------------  CC facial droop and slurred speech.   History of Present Illness   Patient is a 72 yo woman with acute onset L facial droop and slurred speech noted last night. She was LSN at approx 1800. She takes eliquis (last dose at 2100 last night). NO LOC/convulsion. NO trouble finding words or understanding language. NO focal weakness of her limbs.   Diagnostic: CT head with subacute to chronic L parietal infarct.   Exam:  RESULT SUMMARY: 4 points NIH Stroke Scale   INPUTS: 1A: Level of consciousness -> 0 = Alert; keenly responsive 1B: Ask month and age -> 0 = Both questions right 1C: 'Blink eyes' & 'squeeze hands' -> 0 = Performs both tasks 2: Horizontal extraocular movements -> 0 = Normal 3: Visual fields -> 0 = No visual loss 4: Facial palsy ->  1 = Minor paralysis (flat nasolabial fold, smile asymmetry) 5A: Left arm motor drift -> 0 = No drift for 10 seconds 5B: Right arm motor drift -> 0 = No drift for 10 seconds 6A: Left leg motor drift -> 1 = Drift, but doesn't hit bed 6B: Right leg motor drift -> 0 = No drift for 5 seconds 7: Limb Ataxia -> 0 = No ataxia 8: Sensation -> 1 = Mild-moderate loss: less sharp/more dull  9: Language/aphasia -> 0 = Normal; no aphasia 10: Dysarthria -> 1 = Mild-moderate dysarthria: slurring but can be understood 11: Extinction/inattention -> 0 = No abnormality  Medical Decision Making:  - Extensive number of diagnosis or management options are considered above.   - Extensive amount of complex data reviewed.   - High risk of complication and/or morbidity or mortality are associated with differential diagnostic considerations above.  - There may be Uncertain outcome and increased probability of prolonged functional impairment or high probability of severe prolonged functional impairment associated with some of these differential diagnosis.  Medical Data Reviewed:  1.Data reviewed include clinical labs, radiology,  Medical Tests;   2.Tests results discussed w/performing or interpreting physician;   3.Obtaining/reviewing old medical records;  4.Obtaining case history from another source;  5.Independent review of image, tracing or specimen. .    Patient was informed the Neurology Consult would happen via TeleHealth consult by way of interactive audio and video telecommunications and consented to receiving care in this manner.

## 2017-07-06 NOTE — Progress Notes (Signed)
Patient ID: Samantha Aguirre, female   DOB: 22-Jun-1945, 72 y.o.   MRN: [id number] called by RN to evaluate patient with decreased mentation and feeling worse.  Went to see patient. She was getting blood transfusion. Patient is oriented to time person in place. She denied any complaints. She has left upper and lower extremity weakness which has been present since before admission. I do not feel symptoms have gotten worse.  Was a bit sleepy earlier but she had received Ativan for MRI and could be effects of Ativan. Patient if she needed anything she declined. She said she is okay at present. She had some green beans and beef along with mashed potatoes for dinner. Vitals stable.

## 2017-07-06 NOTE — ED Provider Notes (Addendum)
[facility] Emergency Department Provider Note   ____________________________________________   First MD Initiated Contact with Patient 07/06/17 0930     (approximate)  I have reviewed the triage vital signs and the nursing notes.   HISTORY  Chief Complaint Facial Droop and Weakness    HPI Samantha Aguirre is a 72 y.o. female who has had a prior stroke and MI who reports that about 9:00 last night she noticed slurred speech and left-sided facial droop and left-sided weakness.  He decided not to come in because she says she is stubborn.  Today she still has left-sided facial droop left-sided weakness speech is much better.  Arm is not very weak although there is some palmar drift.  She is not having any other problems she says.   Past Medical History:  Diagnosis Date  . Acid reflux   . Arthritis    Bilateral knees, hands, feet  . Asthma   . Atrial fibrillation ([place])   . Cancer ([place])   . Chest pain   . CHF (congestive heart failure) (HCC)    Diastolic CHF  . COPD (chronic obstructive pulmonary disease) ([place])   . Coronary artery disease   . Diabetes mellitus without complication ([place])   . Frequent headaches   . Heart attack ([place])   . High cholesterol   . HLD (hyperlipidemia)   . Hypertension   . MI (myocardial infarction) ([place])   . Seizures ([place])   . Skin cancer 2015   Suspected basal cell carcinoma on nose, surgically removed  . Stroke ([place])   . Thyroid disease   . Tremors of nervous system     Patient Active Problem List   Diagnosis Date Noted  . Stroke (cerebrum) ([place])   . Goals of care, counseling/discussion   . Palliative care encounter   . COPD exacerbation ([place]) 04/03/2017  . Flu 03/27/2017  . Iron deficiency anemia due to chronic blood loss   . Migraine 02/13/2017  . Cataracts, bilateral 01/15/2017  . Chronic venous insufficiency 10/06/2016  . Recurrent major depressive disorder, in partial remission ([place]) 10/06/2016  .  Left-sided weakness 10/03/2016  . Chest pain 02/27/2016  . History of cerebrovascular accident (CVA) with residual deficit 02/19/2016  . Thumb pain, left 01/22/2016  . Traumatic closed nondisplaced fracture of base of metacarpal bone of left thumb 01/22/2016  . Colon cancer screening 11/27/2015  . Depression 11/14/2015  . Coronary artery disease 11/13/2015  . Hypertension 11/13/2015  . History of skin cancer 11/13/2015  . Osteoporosis 11/13/2015  . Chronic low back pain 11/13/2015  . Left medial knee pain 11/13/2015  . Osteoarthritis of multiple joints 11/13/2015  . Other specified hypothyroidism 08/16/2015  . Controlled type 2 diabetes mellitus with diabetic neuropathy ([place]) 08/16/2015  . COPD (chronic obstructive pulmonary disease) ([place]) 08/16/2015  . Tobacco abuse 08/16/2015  . HLD (hyperlipidemia) 08/16/2015  . History of CHF (congestive heart failure) 08/16/2015  . Seizures ([place])     Past Surgical History:  Procedure Laterality Date  . ABDOMINAL HYSTERECTOMY  1976  . APPENDECTOMY  1980  . CAROTID ENDARTERECTOMY    . CHOLECYSTECTOMY  1980  . COLONOSCOPY N/A 02/20/2017   Procedure: COLONOSCOPY;  Surgeon: Lin Landsman, MD;  Location[facility] ENDOSCOPY;  Service: Gastroenterology;  Laterality: N/A;  . ESOPHAGOGASTRODUODENOSCOPY N/A 02/20/2017   Procedure: ESOPHAGOGASTRODUODENOSCOPY (EGD);  Surgeon: Lin Landsman, MD;  Location: [facility] ENDOSCOPY;  Service: Gastroenterology;  Laterality: N/A;  . [place]  . KNEE SURGERY  left  . ROTATOR CUFF REPAIR     right  . TONSILLECTOMY  1950    Prior to Admission medications   Medication Sig Start Date End Date Taking? Authorizing Provider  albuterol (PROVENTIL HFA;VENTOLIN HFA) 108 (90 Base) MCG/ACT inhaler Inhale 2 puffs into the lungs every 6 (six) hours as needed for wheezing or shortness of breath. 10/08/16  Yes Karamalegos, Devonne Doughty, DO  apixaban (ELIQUIS) 5 MG TABS tablet Take 5 mg by mouth every 12  (twelve) hours.   Yes [provider]  atorvastatin (LIPITOR) 80 MG tablet Take 1 tablet (80 mg total) by mouth daily at 6 PM. 09/24/16  Yes Karamalegos, Devonne Doughty, DO  diclofenac sodium (VOLTAREN) 1 % GEL Apply 2 g topically 4 (four) times daily. 01/11/16  Yes Daymon Larsen, MD  DULoxetine (CYMBALTA) 60 MG capsule Take 1 capsule (60 mg total) by mouth at bedtime. 09/24/16  Yes Karamalegos, Devonne Doughty, DO  ferrous sulfate 325 (65 FE) MG EC tablet Take 1 tablet (325 mg total) by mouth 2 (two) times daily with a meal. 02/21/17  Yes Sudini, Srikar, MD  fluticasone (FLONASE) 50 MCG/ACT nasal spray Place 1 spray into both nostrils daily. 10/08/16  Yes Karamalegos, Devonne Doughty, DO  furosemide (LASIX) 20 MG tablet Take 10 mg by mouth daily.    Yes Karamalegos, Devonne Doughty, DO  gabapentin (NEURONTIN) 800 MG tablet Take 1 tablet (800 mg total) by mouth 2 (two) times daily. 09/24/16  Yes Karamalegos, Devonne Doughty, DO  glipiZIDE-metformin (METAGLIP) 5-500 MG tablet Take 1 tablet by mouth 2 (two) times daily before a meal. 04/02/17  Yes Karamalegos, Devonne Doughty, DO  levETIRAcetam (KEPPRA) 500 MG tablet Take 1 tablet (500 mg total) by mouth 2 (two) times daily. 09/19/15  Yes Epifanio Lesches, MD  levothyroxine (SYNTHROID, LEVOTHROID) 175 MCG tablet Take 1 tablet (175 mcg total) by mouth daily before breakfast. Patient taking differently: Take 112 mcg by mouth daily before breakfast.  09/24/16  Yes Karamalegos, Devonne Doughty, DO  lisinopril (PRINIVIL,ZESTRIL) 20 MG tablet Take 1 tablet (20 mg total) by mouth daily. 10/08/16  Yes Karamalegos, Devonne Doughty, DO  nitroGLYCERIN (NITROSTAT) 0.4 MG SL tablet Place 1 tablet (0.4 mg total) under the tongue every 5 (five) minutes as needed for chest pain. 02/28/16  Yes Mody, Ulice Bold, MD  omeprazole (PRILOSEC) 20 MG capsule Take 20 mg by mouth 2 (two) times daily.   Yes [provider]  potassium chloride (MICRO-K) 10 MEQ CR capsule Take 1 capsule (10 mEq total) by  mouth daily. Patient taking differently: Take 10 mEq by mouth 2 (two) times daily.  09/24/16  Yes Karamalegos, Devonne Doughty, DO  sucralfate (CARAFATE) 1 g tablet TAKE (1) TABLET BY MOUTH FOUR TIMES A DAY 03/26/17  Yes Karamalegos, Devonne Doughty, DO  umeclidinium bromide (INCRUSE ELLIPTA) 62.5 MCG/INH AEPB Inhale 1 puff into the lungs daily. 11/13/15  Yes Karamalegos, Devonne Doughty, DO  vitamin B-12 (CYANOCOBALAMIN) 1000 MCG tablet Take 1 tablet by mouth daily. 06/29/17  Yes [provider]  fluticasone (FLOVENT HFA) 220 MCG/ACT inhaler Inhale 1 puff into the lungs 2 (two) times daily. Patient not taking: Reported on 07/06/2017 04/02/17   Loletha Grayer, MD  guaiFENesin-dextromethorphan [facility] DM) 100-10 MG/5ML syrup Take 5 mLs by mouth every 4 (four) hours as needed for cough. Patient not taking: Reported on 05/19/2017 04/07/17   Demetrios Loll, MD  MAPAP 325 MG tablet TAKE 2 TABLETS BY MOUTH TWICE DAILY Patient not taking: Reported on 05/19/2017 04/27/17  Karamalegos, Alexander J, DO  predniSONE (DELTASONE) 10 MG tablet 40 mg po daily 1 day, 30 mg po daily 1 day,20 mg po daily 1 day, 10 mg po daily 1 day. Patient not taking: Reported on 05/19/2017 04/08/17   Demetrios Loll, MD  SUMAtriptan (IMITREX) 25 MG tablet Take 1 tablet (25 mg total) by mouth once as needed for up to 1 dose for migraine. May repeat one dose in 2 hours if persists, max in 24 hrs Patient not taking: Reported on 05/19/2017 02/13/17   Olin Hauser, DO    Allergies Mushroom extract complex; Atenolol; Ivp dye [iodinated diagnostic agents]; Morphine and related; Percocet [oxycodone-acetaminophen]; Percodan [oxycodone-aspirin]; and Verapamil  Family History  Problem Relation Age of Onset  . Breast cancer Mother   . Heart disease Mother   . Stroke Mother   . Cancer Mother   . COPD Mother   . Diabetes Mother   . Heart disease Father   . Diabetes Father   . Stroke Father   . Alcohol abuse Sister   . Drug abuse Sister   .  Stroke Sister   . Cancer Sister   . Mental illness Sister   . Heart disease Brother   . Arthritis Brother   . Diabetes Brother   . Heart disease Maternal Grandfather   . Heart disease Paternal Grandfather     Social History Social History   Tobacco Use  . Smoking status: Current Every Day Smoker    Packs/day: 1.00    Years: 50.00    Pack years: 50.00    Types: Cigarettes  . Smokeless tobacco: Never Used  Substance Use Topics  . Alcohol use: No  . Drug use: No    Review of Systems  Constitutional: No fever/chills Eyes: No visual changes. ENT: No sore throat. Cardiovascular: Denies chest pain. Respiratory: Denies shortness of breath. Gastrointestinal: No abdominal pain.  No nausea, no vomiting.  No diarrhea.  No constipation. Genitourinary: Negative for dysuria. Musculoskeletal: Negative for back pain. Skin: Negative for rash. Neurological: Negative for headaches see HPI  ____________________________________________   PHYSICAL EXAM:  VITAL SIGNS: ED Triage Vitals  Enc Vitals Group     BP      Pulse      Resp      Temp      Temp src      SpO2      Weight      Height      Head Circumference      Peak Flow      Pain Score      Pain Loc      Pain Edu?      Excl. in [place]?     Constitutional: Alert and oriented. Well appearing and in no acute distress. Eyes: Conjunctivae are normal. PER. EOMI. Head: Atraumatic. Nose: No congestion/rhinnorhea. Mouth/Throat: Mucous membranes are moist.  Oropharynx non-erythematous. Neck: No stridor.  Cardiovascular: Normal rate, regular rhythm. Grossly normal heart sounds.  Good peripheral circulation. Respiratory: Normal respiratory effort.  No retractions. Lungs CTAB. Gastrointestinal: Soft and nontender. No distention. No abdominal bruits. No CVA tenderness. Musculoskeletal: No lower extremity tenderness nor edema.   Neurologic:  Normal speech and language.  Patient does have a left-sided facial droop.  She does have  left-sided palmar drift the left arm is slightly weak rapid alternating movements on the left in the left hand are slowed and patient cannot raise her left leg is much as she can the right. Skin:  Skin is  warm, dry and intact. No rash noted. Psychiatric: Mood and affect are normal. Speech and behavior are normal.  ____________________________________________   LABS (all labs ordered are listed, but only abnormal results are displayed)  Labs Reviewed  COMPREHENSIVE METABOLIC PANEL - Abnormal; Notable for the following components:      Result Value   Potassium 2.7 (*)    Glucose, Bld 156 (*)    Creatinine, Ser 0.42 (*)    Calcium 8.1 (*)    Total Protein 5.9 (*)    Albumin 3.2 (*)    ALT 10 (*)    All other components within normal limits  CBC WITH DIFFERENTIAL/PLATELET - Abnormal; Notable for the following components:   RBC 3.14 (*)    Hemoglobin 6.7 (*)    HCT 21.8 (*)    MCV 69.4 (*)    MCH 21.4 (*)    MCHC 30.8 (*)    RDW 22.7 (*)    All other components within normal limits  GLUCOSE, CAPILLARY - Abnormal; Notable for the following components:   Glucose-Capillary 150 (*)    All other components within normal limits  ETHANOL  PROTIME-INR  APTT  TROPONIN I  URINE DRUG SCREEN, QUALITATIVE ([facility] ONLY)  URINALYSIS, ROUTINE W REFLEX MICROSCOPIC   ____________________________________________  EKG  EKG read and interpreted by me shows normal sinus rhythm rate of 73 left axis no acute ST-T wave changes computer is reading short PR interval ____________________________________________  RADIOLOGY  ED MD interpretation:   Official radiology report(s): Ct Head Wo Contrast  Result Date: 07/06/2017 CLINICAL DATA:  Slight facial droop and weakness since last night at 9 p.m. EXAM: CT HEAD WITHOUT CONTRAST TECHNIQUE: Contiguous axial images were obtained from the base of the skull through the vertex without intravenous contrast. COMPARISON:  04/05/2017 FINDINGS: Brain: No  evidence of acute infarction, hemorrhage, hydrocephalus, extra-axial collection or mass lesion/mass effect. Moderate remote left MCA territory infarct centered in the left parietal and posterior temporal lobes. Patchy low-density in the cerebral white matter from chronic small vessel ischemia. Small remote bilateral cerebellar infarcts. Vascular: No hyperdense vessel. Skull: No acute finding Sinuses/Orbits: Negative Other: These results were called by telephone at the time of interpretation on 07/06/2017 at 9:56 am to Dr. Conni Slipper , who verbally acknowledged these results. IMPRESSION: 1. No acute finding. 2. Chronic ischemic injury is described above. Electronically Signed   By: Monte Fantasia M.D.   On: 07/06/2017 09:58    ____________________________________________   PROCEDURES  Procedure(s) performed:  Procedures  Critical Care performed:   ____________________________________________   INITIAL IMPRESSION / ASSESSMENT AND PLAN / ED COURSE  ----------------------------------------- 9:39 AM on 07/06/2017 -----------------------------------------  Patient is out of the window for TPA but she may have a large vessel occlusion so I will activate the stroke system and we will see.  ----------------------------------------- 10:44 AM on 07/06/2017 -----------------------------------------  Tele-neurology suggests MRI brain and aspirin hold Eliquis and to get the MRI.  Please note that I read the tele-neurologist note he did not speak to me.       ____________________________________________   FINAL CLINICAL IMPRESSION(S) / ED DIAGNOSES  Final diagnoses:  Cerebrovascular accident (CVA), unspecified mechanism [facility])     ED Discharge Orders    None       Note:  This document was prepared using Dragon voice recognition software and may include unintentional dictation errors.    Nena Polio, MD 07/06/17 1045    Nena Polio, MD 07/06/17 1047

## 2017-07-06 NOTE — Progress Notes (Signed)
OT Cancellation Note  Patient Details Name: Samantha Aguirre MRN: [id number] DOB: 1945/03/04   Cancelled Treatment:    Reason Eval/Treat Not Completed: Medical issues which prohibited therapy. Order received, chart reviewed. Pt noted with critical lab values including K+ 2.7 and Hgb 6.7, both falling outside the bounds for participation in therapy. Chart noting plan for blood transfusion this date. Will hold OT evaluation and re-attempt next date pending labs and pt's medical appropriateness.   Jeni Salles, MPH, MS, OTR/L ascom [phone] 07/06/17, 1:22 PM

## 2017-07-06 NOTE — Progress Notes (Signed)
MD aware of increased lethargy, increased left sided weakness and left sided drift.  Kyla Balzarine, LPN

## 2017-07-06 NOTE — Progress Notes (Signed)
Chaplain responded to a RRT PG to the patient's room. Upon arrival, chaplain observed Care Team assessing patient's status. Chaplain waited just beyond doorway as the room was filled with medical staff. Patient was alert and able to talk, but she seemed less aware than when chaplain saw her in the ED upon arrival. Patient noticed chaplain and motioned for her to come in the room. Nurses said it's always good to see a friendly face and welcomed me in. Chaplain provided emotional support to patient who said she was afraid by holding her hand and standing close as nursing staff completed assessment. Chaplain received a PG and needed to step outside room briefly. Upon return chaplain talked with patient providing further emotional support and spiritual care, including prayer. Patient said her family lives close and will be with her tomorrow. Chaplain asked if she could follow-up with her later and encouraged patient to ask nurse to page her, if necessary.    07/06/17 1830  Clinical Encounter Type  Visited With Patient  Visit Type Code  Spiritual Encounters  Spiritual Needs Prayer;Emotional

## 2017-07-06 NOTE — Consult Note (Addendum)
Referring Physician: Posey Pronto    Chief Complaint: Left sided weakness  HPI: Samantha Aguirre is an 72 y.o. female with a history of stroke and afib on Eliquis who presents with complaints of left sided weakness.  Patient repots that on yesterday she began to experience left sided weakness.  Also noted that her left face was drooping and numb.    Reports pain in her joints on the left.  With no improvement today presented for evaluation.  Initial NIHSS of 3.  Date last known well: Date: 07/05/2017 Time last known well: Time: 18:00 tPA Given: No: Outside time window, On Eliquis  Past Medical History:  Diagnosis Date  . Acid reflux   . Arthritis    Bilateral knees, hands, feet  . Asthma   . Atrial fibrillation ([place])   . Cancer ([place])   . Chest pain   . CHF (congestive heart failure) (HCC)    Diastolic CHF  . COPD (chronic obstructive pulmonary disease) ([place])   . Coronary artery disease   . Diabetes mellitus without complication ([place])   . Frequent headaches   . Heart attack ([employer])   . High cholesterol   . HLD (hyperlipidemia)   . Hypertension   . MI (myocardial infarction) ([place])   . Seizures ([place])   . Skin cancer 2015   Suspected basal cell carcinoma on nose, surgically removed  . Stroke ([place])   . Thyroid disease   . Tremors of nervous system     Past Surgical History:  Procedure Laterality Date  . ABDOMINAL HYSTERECTOMY  1976  . APPENDECTOMY  1980  . CAROTID ENDARTERECTOMY    . CHOLECYSTECTOMY  1980  . COLONOSCOPY N/A 02/20/2017   Procedure: COLONOSCOPY;  Surgeon: Lin Landsman, MD;  Location: [facility] ENDOSCOPY;  Service: Gastroenterology;  Laterality: N/A;  . ESOPHAGOGASTRODUODENOSCOPY N/A 02/20/2017   Procedure: ESOPHAGOGASTRODUODENOSCOPY (EGD);  Surgeon: Lin Landsman, MD;  Location: [facility] ENDOSCOPY;  Service: Gastroenterology;  Laterality: N/A;  . [place]  . KNEE SURGERY     left  . ROTATOR CUFF REPAIR     right  . TONSILLECTOMY  1950     Family History  Problem Relation Age of Onset  . Breast cancer Mother   . Heart disease Mother   . Stroke Mother   . Cancer Mother   . COPD Mother   . Diabetes Mother   . Heart disease Father   . Diabetes Father   . Stroke Father   . Alcohol abuse Sister   . Drug abuse Sister   . Stroke Sister   . Cancer Sister   . Mental illness Sister   . Heart disease Brother   . Arthritis Brother   . Diabetes Brother   . Heart disease Maternal Grandfather   . Heart disease Paternal Grandfather    Social History:  reports that she has been smoking cigarettes.  She has a 50.00 pack-year smoking history. She has never used smokeless tobacco. She reports that she does not drink alcohol or use drugs.  Allergies:  Allergies  Allergen Reactions  . Mushroom Extract Complex Anaphylaxis    Made patient "deathly sick"  . Atenolol     "MD took me off of it bc it was doing something wrong" Made patient HYPOTENSIVE  . Ivp Dye [Iodinated Diagnostic Agents] Itching    Pt injected with IV contrast.  5 min after injection pt complained of itching behind ear and on her abd.  . Morphine And Related Other (See  Comments)    "stops my breathing" NEAR-RESPIRATORY ARREST  . Percocet [Oxycodone-Acetaminophen] Nausea And Vomiting and Other (See Comments)    Stomach pains  . Percodan [Oxycodone-Aspirin] Nausea And Vomiting and Other (See Comments)    Stomach pains  . Verapamil Other (See Comments)    " MD told me this was screwing me up" MAKES PATIENT HYPOTENSIVE    Medications:  I have reviewed the patient's current medications. Prior to Admission:  Medications Prior to Admission  Medication Sig Dispense Refill Last Dose  . albuterol (PROVENTIL HFA;VENTOLIN HFA) 108 (90 Base) MCG/ACT inhaler Inhale 2 puffs into the lungs every 6 (six) hours as needed for wheezing or shortness of breath. 3 Inhaler 1 Taking  . apixaban (ELIQUIS) 5 MG TABS tablet Take 5 mg by mouth every 12 (twelve) hours.   Taking   . atorvastatin (LIPITOR) 80 MG tablet Take 1 tablet (80 mg total) by mouth daily at 6 PM. 90 tablet 3 Taking  . diclofenac sodium (VOLTAREN) 1 % GEL Apply 2 g topically 4 (four) times daily. 1 Tube 1 Taking  . DULoxetine (CYMBALTA) 60 MG capsule Take 1 capsule (60 mg total) by mouth at bedtime. 30 capsule 11 Taking  . ferrous sulfate 325 (65 FE) MG EC tablet Take 1 tablet (325 mg total) by mouth 2 (two) times daily with a meal. 60 tablet 0 Not Taking  . fluticasone (FLONASE) 50 MCG/ACT nasal spray Place 1 spray into both nostrils daily. 48 g 3 Taking  . furosemide (LASIX) 20 MG tablet Take 10 mg by mouth daily.  30 tablet 3 Taking  . gabapentin (NEURONTIN) 800 MG tablet Take 1 tablet (800 mg total) by mouth 2 (two) times daily. 180 tablet 1 Taking  . glipiZIDE-metformin (METAGLIP) 5-500 MG tablet Take 1 tablet by mouth 2 (two) times daily before a meal. 180 tablet 1 Taking  . levETIRAcetam (KEPPRA) 500 MG tablet Take 1 tablet (500 mg total) by mouth 2 (two) times daily. 60 tablet 0 Taking  . levothyroxine (SYNTHROID, LEVOTHROID) 175 MCG tablet Take 1 tablet (175 mcg total) by mouth daily before breakfast. (Patient taking differently: Take 112 mcg by mouth daily before breakfast. ) 90 tablet 3 Taking  . lisinopril (PRINIVIL,ZESTRIL) 20 MG tablet Take 1 tablet (20 mg total) by mouth daily. 90 tablet 3 Taking  . nitroGLYCERIN (NITROSTAT) 0.4 MG SL tablet Place 1 tablet (0.4 mg total) under the tongue every 5 (five) minutes as needed for chest pain. 30 tablet 0 Not Taking  . omeprazole (PRILOSEC) 20 MG capsule Take 20 mg by mouth 2 (two) times daily.   Taking  . potassium chloride (MICRO-K) 10 MEQ CR capsule Take 1 capsule (10 mEq total) by mouth daily. (Patient taking differently: Take 10 mEq by mouth 2 (two) times daily. ) 90 capsule 3 Taking  . sucralfate (CARAFATE) 1 g tablet TAKE (1) TABLET BY MOUTH FOUR TIMES A DAY 360 tablet 1 Taking  . umeclidinium bromide (INCRUSE ELLIPTA) 62.5 MCG/INH AEPB  Inhale 1 puff into the lungs daily. 1 each 11 Taking  . vitamin B-12 (CYANOCOBALAMIN) 1000 MCG tablet Take 1 tablet by mouth daily.  1    Scheduled: .  stroke: mapping our early stages of recovery book   Does not apply Once  . aspirin  300 mg Rectal Daily   Or  . aspirin  325 mg Oral Daily  . atorvastatin  80 mg Oral q1800  . DULoxetine  60 mg Oral QHS  . enoxaparin (LOVENOX) injection  40 mg Subcutaneous Q24H  . [START ON 07/07/2017] ferrous sulfate  325 mg Oral Q24H  . fluticasone  1 spray Each Nare Daily  . furosemide  10 mg Oral Daily  . gabapentin  800 mg Oral BID  . glipiZIDE  5 mg Oral BID AC  . insulin aspart  0-9 Units Subcutaneous TID WC  . levETIRAcetam  500 mg Oral BID  . [START ON 07/07/2017] levothyroxine  112 mcg Oral QAC breakfast  . lisinopril  20 mg Oral Daily  . metFORMIN  500 mg Oral BID WC  . pantoprazole  40 mg Oral Daily  . potassium chloride  20 mEq Oral TID  . [START ON 07/07/2017] vitamin B-12  1,000 mcg Oral Daily    ROS: History obtained from the patient  General ROS: negative for - chills, fatigue, fever, night sweats, weight gain or weight loss Psychological ROS: negative for - behavioral disorder, hallucinations, memory difficulties, mood swings or suicidal ideation Ophthalmic ROS: negative for - blurry vision, double vision, eye pain or loss of vision ENT ROS: negative for - epistaxis, nasal discharge, oral lesions, sore throat, tinnitus or vertigo Allergy and Immunology ROS: negative for - hives or itchy/watery eyes Hematological and Lymphatic ROS: negative for - bleeding problems, bruising or swollen lymph nodes Endocrine ROS: negative for - galactorrhea, hair pattern changes, polydipsia/polyuria or temperature intolerance Respiratory ROS: negative for - cough, hemoptysis, shortness of breath or wheezing Cardiovascular ROS: negative for - chest pain, dyspnea on exertion, edema or irregular heartbeat Gastrointestinal ROS: negative for - abdominal  pain, diarrhea, hematemesis, nausea/vomiting or stool incontinence Genito-Urinary ROS: negative for - dysuria, hematuria, incontinence or urinary frequency/urgency Musculoskeletal ROS: left shoulder and knee pain Neurological ROS: as noted in HPI Dermatological ROS: negative for rash and skin lesion changes  Physical Examination: Blood pressure 110/63, pulse 64, temperature 97.9 F (36.6 C), temperature source Oral, resp. rate 16, height 5\' 1"  (1.549 m), weight 82.4 kg (181 lb 11.2 oz), SpO2 99 %.  HEENT-  Normocephalic, no lesions, without obvious abnormality.  Normal external eye and conjunctiva.  Normal TM's bilaterally.  Normal auditory canals and external ears. Normal external nose, mucus membranes and septum.  Normal pharynx. Cardiovascular- S1, S2 normal, pulses palpable throughout   Lungs- chest clear, no wheezing, rales, normal symmetric air entry Abdomen- soft, non-tender; bowel sounds normal; no masses,  no organomegaly Extremities- no joint deformities, effusion, or inflammation Lymph-no adenopathy palpable Musculoskeletal-left leg swelling, pain on palpation of the shoulder and knee joint Skin-warm and dry, no hyperpigmentation, vitiligo, or suspicious lesions  Neurological Examination   Mental Status: Alert, oriented, thought content appropriate.  Speech fluent without evidence of aphasia.  Able to follow 3 step commands without difficulty. Cranial Nerves: II: Discs flat bilaterally; Visual fields grossly normal, pupils equal, round, reactive to light and accommodation III,IV, VI: ptosis not present, extra-ocular motions intact bilaterally V,VII: smile symmetric, facial light touch sensation decreased on the left VIII: hearing normal bilaterally IX,X: gag reflex present XI: bilateral shoulder shrug XII: midline tongue extension Motor: Right : Upper extremity   5/5    Left:     Upper extremity   5-/5 limited due to pain  Lower extremity   5/5     Lower extremity   4-/5  limited due to pain Tone and bulk:normal tone throughout; no atrophy noted Sensory: Pinprick and light touch decreased in the feet bilaterally Deep Tendon Reflexes: 2+ and symmetric with absent AJ;s bilaterally Plantars: Right: mute   Left: mute  Cerebellar: Normal finger-to-nose with tremor bilaterally, normal heel-to-shin test Gait: not tested due to safety concerns    Laboratory Studies:  Basic Metabolic Panel: Recent Labs  Lab 07/06/17 0936 07/06/17 1227 07/06/17 1432  NA 140  --   --   K 2.7*  --   --   CL 105  --   --   CO2 23  --   --   GLUCOSE 156*  --   --   BUN 11  --   --   CREATININE 0.42*  --   --   CALCIUM 8.1*  --   --   MG  --  1.7  --   PHOS  --   --  3.8    Liver Function Tests: Recent Labs  Lab 07/06/17 0936  AST 20  ALT 10*  ALKPHOS 88  BILITOT 0.5  PROT 5.9*  ALBUMIN 3.2*   No results for input(s): LIPASE, AMYLASE in the last 168 hours. No results for input(s): AMMONIA in the last 168 hours.  CBC: Recent Labs  Lab 07/06/17 0936  WBC 5.9  NEUTROABS 3.9  HGB 6.7*  HCT 21.8*  MCV 69.4*  PLT 314    Cardiac Enzymes: Recent Labs  Lab 07/06/17 0936  TROPONINI <0.03    BNP: Invalid input(s): POCBNP  CBG: Recent Labs  Lab 07/06/17 0955 07/06/17 1353 07/06/17 1636  GLUCAP 150* 106* 170*    Microbiology: Results for orders placed or performed during the hospital encounter of 03/27/17  Culture, blood (routine x 2)     Status: None   Collection Time: 03/27/17 12:44 PM  Result Value Ref Range Status   Specimen Description BLOOD LAC  Final   Special Requests   Final    BOTTLES DRAWN AEROBIC AND ANAEROBIC Blood Culture adequate volume   Culture   Final    NO GROWTH 5 DAYS Performed at [facility], [address] [place], [place] [address]    Report Status 04/01/2017 FINAL  Final  Culture, blood (routine x 2)     Status: None   Collection Time: 03/27/17 12:44 PM  Result Value Ref Range Status   Specimen  Description BLOOD RAC  Final   Special Requests   Final    BOTTLES DRAWN AEROBIC AND ANAEROBIC Blood Culture adequate volume   Culture   Final    NO GROWTH 5 DAYS Performed at [facility], [address] [place], [place] [address]    Report Status 04/01/2017 FINAL  Final    Coagulation Studies: Recent Labs    07/06/17 0936  LABPROT 14.7  INR 1.16    Urinalysis:  Recent Labs  Lab 07/06/17 1114  COLORURINE YELLOW*  LABSPEC 1.017  PHURINE 6.0  GLUCOSEU NEGATIVE  HGBUR NEGATIVE  BILIRUBINUR NEGATIVE  KETONESUR NEGATIVE  PROTEINUR NEGATIVE  NITRITE NEGATIVE  LEUKOCYTESUR NEGATIVE    Lipid Panel:    Component Value Date/Time   CHOL 132 04/06/2017 0322   TRIG 91 04/06/2017 0322   HDL 51 04/06/2017 0322   CHOLHDL 2.6 04/06/2017 0322   VLDL 18 04/06/2017 0322   LDLCALC 63 04/06/2017 0322    HgbA1C:  Lab Results  Component Value Date   HGBA1C 6.3 (H) 04/06/2017    Urine Drug Screen:      Component Value Date/Time   LABOPIA NONE DETECTED 07/06/2017 1114   COCAINSCRNUR NONE DETECTED 07/06/2017 1114   LABBENZ NONE DETECTED 07/06/2017 1114   AMPHETMU NONE DETECTED 07/06/2017 1114   THCU NONE  DETECTED 07/06/2017 1114   LABBARB (A) 07/06/2017 1114    Result not available. Reagent lot number recalled by manufacturer.    Alcohol Level:  Recent Labs  Lab 07/06/17 0936  ETH <10    Other results: EKG: sinus rhythm at 73 bpm.  Imaging: Ct Head Wo Contrast  Result Date: 07/06/2017 CLINICAL DATA:  Slight facial droop and weakness since last night at 9 p.m. EXAM: CT HEAD WITHOUT CONTRAST TECHNIQUE: Contiguous axial images were obtained from the base of the skull through the vertex without intravenous contrast. COMPARISON:  04/05/2017 FINDINGS: Brain: No evidence of acute infarction, hemorrhage, hydrocephalus, extra-axial collection or mass lesion/mass effect. Moderate remote left MCA territory infarct centered in the left parietal and posterior temporal  lobes. Patchy low-density in the cerebral white matter from chronic small vessel ischemia. Small remote bilateral cerebellar infarcts. Vascular: No hyperdense vessel. Skull: No acute finding Sinuses/Orbits: Negative Other: These results were called by telephone at the time of interpretation on 07/06/2017 at 9:56 am to Dr. Conni Slipper , who verbally acknowledged these results. IMPRESSION: 1. No acute finding. 2. Chronic ischemic injury is described above. Electronically Signed   By: Monte Fantasia M.D.   On: 07/06/2017 09:58   [name]  Result Date: 07/06/2017 CLINICAL DATA:  Focal neuro deficit. History of stroke, atrial fibrillation. On Eliquis EXAM: MRI HEAD WITHOUT CONTRAST TECHNIQUE: Multiplanar, multiecho pulse sequences of the brain and surrounding structures were obtained without intravenous contrast. COMPARISON:  MRI head 04/05/2017 FINDINGS: Brain: Left temporoparietal infarct with volume loss and mild cortical hemorrhage is noted on the prior study. Moderate chronic microvascular ischemic change throughout the white matter. Small chronic infarcts in the cerebellum bilaterally. Negative for acute infarct. 1 cm calcified extra-axial mass over the right convexity compatible with a small meningioma unchanged from prior studies. No adjacent edema in the brain. Mild to moderate atrophy unchanged. Vascular: Normal arterial flow void Skull and upper cervical spine: Negative Sinuses/Orbits: Mild mucosal edema paranasal sinuses. Normal orbit bilaterally Other: None IMPRESSION: Negative for acute infarct Atrophy with moderate to advanced chronic ischemic changes. 1 cm right convexity meningioma unchanged from prior studies. Electronically Signed   By: Franchot Gallo M.D.   On: 07/06/2017 12:56    Assessment: 71 y.o. female presenting with left sided weakness and pain.  Some of her symptoms have improved and at this time what is left may be an issue due to pain (facial symptoms have resolved other  than some tingling).  Patient on Eliquis at home but this has had to be discontinued due to significant drop in hct with unclear source.   Started on ASA.  On a statin prior to admission.  Patient not in afib at this time.  MRI of the brain reviewed and shows no acute changes.  Carotid dopplers show no evidence of hemodynamically significant stenosis.  Echocardiogram shows no cardiac source of emboli with an EF of 55-60%.  Stroke Risk Factors - atrial fibrillation, diabetes mellitus, hyperlipidemia, hypertension and smoking  Plan: 1. Uric acid, HgbA1c, fasting lipid panel.  Target LDL<70, target A1c<7.0. 2. Smoking cessation counseling 3. PT consult, OT consult, Speech consult 4. Prophylactic therapy-Continue ASA 5. NPO until RN stroke swallow screen 6. Telemetry monitoring 7. Frequent neuro checks   Alexis Goodell, MD Neurology [phone] 07/06/2017, 5:17 PM

## 2017-07-06 NOTE — Progress Notes (Signed)
Pharmacy Electrolyte Monitoring Consult:  Pharmacy consulted to assist in monitoring and replacing electrolytes in this 72 y.o. female admitted on 07/06/2017 with Facial Droop and Weakness   Labs:  Sodium (mmol/L)  Date Value  07/06/2017 140   Potassium (mmol/L)  Date Value  07/06/2017 2.7 (LL)   Magnesium (mg/dL)  Date Value  04/03/2017 1.9   Phosphorus (mg/dL)  Date Value  02/28/2016 3.3   Calcium (mg/dL)  Date Value  07/06/2017 8.1 (L)   Albumin (g/dL)  Date Value  07/06/2017 3.2 (L)    Assessment/Plan: KCl 40 meq given this AM. Will order an additional 40 meq KCl orally along with magnesium sulfate 2 g iv once. Will recheck K at 1800.   Ulice Dash D 07/06/2017 12:18 PM

## 2017-07-06 NOTE — Consult Note (Signed)
[facility]  Date of admission:  07/06/2017  Inpatient day:  07/06/2017  Consulting physician: Dr. Fritzi Mandes   Reason for Consultation:  Severe iron deficiency anemia  Chief Complaint: Samantha Aguirre is a 72 y.o. female with a history of iron deficiency who was admitted with left sided weakness and severe microcystic anemia.  HPI: The patient was admitted to [facility] from 02/18/2017 - 02/21/2017 with iron deficiency anemia secondary to blood loss.  Hemoglobin was 6.9.  Ferritin was 2.  B12 was 182 and folate 23.  She received 2 units of PRBCs.  She underwent upper and lower endoscopy by Dr. Marius Ditch on 02/20/2017.  Upper endoscopy was normal.  Colonoscopy revealed ten 7-10 mm polyps in the rectum, descending colon, transverse colon and ascending colon.  There were 2 diminutive polyps in the cecum.  This was felt to be the probable source of blood loss.  Hemoglobin at discharge was 8.7.  She was placed on oral iron and B12.  Prior to admission, last CBC on 04/07/2017 revealed a hematocrit of 36.3, hemoglobin 11.9, MCV 83, platelets 262,000 and white count 8500.  The patient states that her diet has been fair.  She does not eat much red meat.  She has taken her oral iron.  She notes black stools.  The patient presented with upper and lower extremity weakness.  Head CT was negative for acute CVA.  Head MRI without contrast was negative for acute infarct.  CBC included a hematocrit of 21.8, hemoglobin 6.7, MCV 69.4, platelets 314,000, white count 5900 with a normal differential.  B12 was 504.  Creatinine is 0.42.  PT and PTT are normal.  Symptomatically, she is fatigued after receiving Ativan this afternoon for a head MRI.  She notes that she is claustrophobic.   Past Medical History:  Diagnosis Date  . Acid reflux   . Arthritis    Bilateral knees, hands, feet  . Asthma   . Atrial fibrillation ([place])   . Cancer ([place])   . Chest pain   . CHF (congestive heart failure) (HCC)     Diastolic CHF  . COPD (chronic obstructive pulmonary disease) ([place])   . Coronary artery disease   . Diabetes mellitus without complication ([place])   . Frequent headaches   . Heart attack ([place])   . High cholesterol   . HLD (hyperlipidemia)   . Hypertension   . MI (myocardial infarction) ([place])   . Seizures ([place])   . Skin cancer 2015   Suspected basal cell carcinoma on nose, surgically removed  . Stroke ([place])   . Thyroid disease   . Tremors of nervous system     Past Surgical History:  Procedure Laterality Date  . ABDOMINAL HYSTERECTOMY  1976  . APPENDECTOMY  1980  . CAROTID ENDARTERECTOMY    . CHOLECYSTECTOMY  1980  . COLONOSCOPY N/A 02/20/2017   Procedure: COLONOSCOPY;  Surgeon: Lin Landsman, MD;  Location: [facility] ENDOSCOPY;  Service: Gastroenterology;  Laterality: N/A;  . ESOPHAGOGASTRODUODENOSCOPY N/A 02/20/2017   Procedure: ESOPHAGOGASTRODUODENOSCOPY (EGD);  Surgeon: Lin Landsman, MD;  Location: [facility] ENDOSCOPY;  Service: Gastroenterology;  Laterality: N/A;  . [place]  . KNEE SURGERY     left  . ROTATOR CUFF REPAIR     right  . TONSILLECTOMY  1950    Family History  Problem Relation Age of Onset  . Breast cancer Mother   . Heart disease Mother   . Stroke Mother   . Cancer Mother   .  COPD Mother   . Diabetes Mother   . Heart disease Father   . Diabetes Father   . Stroke Father   . Alcohol abuse Sister   . Drug abuse Sister   . Stroke Sister   . Cancer Sister   . Mental illness Sister   . Heart disease Brother   . Arthritis Brother   . Diabetes Brother   . Heart disease Maternal Grandfather   . Heart disease Paternal Grandfather     Social History:  reports that she has been smoking cigarettes.  She has a 50.00 pack-year smoking history. She has never used smokeless tobacco. She reports that she does not drink alcohol or use drugs.  She is initially alone today than accompanied by her nurse.  Allergies:  Allergies  Allergen  Reactions  . Mushroom Extract Complex Anaphylaxis    Made patient "deathly sick"  . Atenolol     "MD took me off of it bc it was doing something wrong" Made patient HYPOTENSIVE  . Ivp Dye [Iodinated Diagnostic Agents] Itching    Pt injected with IV contrast.  5 min after injection pt complained of itching behind ear and on her abd.  . Morphine And Related Other (See Comments)    "stops my breathing" NEAR-RESPIRATORY ARREST  . Percocet [Oxycodone-Acetaminophen] Nausea And Vomiting and Other (See Comments)    Stomach pains  . Percodan [Oxycodone-Aspirin] Nausea And Vomiting and Other (See Comments)    Stomach pains  . Verapamil Other (See Comments)    " MD told me this was screwing me up" MAKES PATIENT HYPOTENSIVE    Medications Prior to Admission  Medication Sig Dispense Refill  . albuterol (PROVENTIL HFA;VENTOLIN HFA) 108 (90 Base) MCG/ACT inhaler Inhale 2 puffs into the lungs every 6 (six) hours as needed for wheezing or shortness of breath. 3 Inhaler 1  . apixaban (ELIQUIS) 5 MG TABS tablet Take 5 mg by mouth every 12 (twelve) hours.    Marland Kitchen atorvastatin (LIPITOR) 80 MG tablet Take 1 tablet (80 mg total) by mouth daily at 6 PM. 90 tablet 3  . diclofenac sodium (VOLTAREN) 1 % GEL Apply 2 g topically 4 (four) times daily. 1 Tube 1  . DULoxetine (CYMBALTA) 60 MG capsule Take 1 capsule (60 mg total) by mouth at bedtime. 30 capsule 11  . ferrous sulfate 325 (65 FE) MG EC tablet Take 1 tablet (325 mg total) by mouth 2 (two) times daily with a meal. 60 tablet 0  . fluticasone (FLONASE) 50 MCG/ACT nasal spray Place 1 spray into both nostrils daily. 48 g 3  . furosemide (LASIX) 20 MG tablet Take 10 mg by mouth daily.  30 tablet 3  . gabapentin (NEURONTIN) 800 MG tablet Take 1 tablet (800 mg total) by mouth 2 (two) times daily. 180 tablet 1  . glipiZIDE-metformin (METAGLIP) 5-500 MG tablet Take 1 tablet by mouth 2 (two) times daily before a meal. 180 tablet 1  . levETIRAcetam (KEPPRA) 500 MG  tablet Take 1 tablet (500 mg total) by mouth 2 (two) times daily. 60 tablet 0  . levothyroxine (SYNTHROID, LEVOTHROID) 175 MCG tablet Take 1 tablet (175 mcg total) by mouth daily before breakfast. (Patient taking differently: Take 112 mcg by mouth daily before breakfast. ) 90 tablet 3  . lisinopril (PRINIVIL,ZESTRIL) 20 MG tablet Take 1 tablet (20 mg total) by mouth daily. 90 tablet 3  . nitroGLYCERIN (NITROSTAT) 0.4 MG SL tablet Place 1 tablet (0.4 mg total) under the tongue every 5 (  five) minutes as needed for chest pain. 30 tablet 0  . omeprazole (PRILOSEC) 20 MG capsule Take 20 mg by mouth 2 (two) times daily.    . potassium chloride (MICRO-K) 10 MEQ CR capsule Take 1 capsule (10 mEq total) by mouth daily. (Patient taking differently: Take 10 mEq by mouth 2 (two) times daily. ) 90 capsule 3  . sucralfate (CARAFATE) 1 g tablet TAKE (1) TABLET BY MOUTH FOUR TIMES A DAY 360 tablet 1  . umeclidinium bromide (INCRUSE ELLIPTA) 62.5 MCG/INH AEPB Inhale 1 puff into the lungs daily. 1 each 11  . vitamin B-12 (CYANOCOBALAMIN) 1000 MCG tablet Take 1 tablet by mouth daily.  1    Review of Systems: GENERAL:  Fatigue x 2 weeks.  No fevers, sweats or weight loss. PERFORMANCE STATUS (ECOG):  1-2 HEENT:  No visual changes, runny nose, sore throat, mouth sores or tenderness. Lungs: No shortness of breath or cough.  No hemoptysis. Cardiac:  No chest pain, palpitations, orthopnea, or PND. GI:  No nausea, vomiting, diarrhea, constipation, or hematochezia.  Black stool (? secondary to iron). GU:  No urgency, frequency, dysuria, or hematuria. Musculoskeletal:  No back pain.  No joint pain.  No muscle tenderness. Extremities:  No pain or swelling. Skin:  No rashes or skin changes. Neuro:  Left sided weakness and facial droop.  No headache. Endocrine:  No diabetes.  Thyroid issues on Synthroid.  hot flashes or night sweats. Psych:  No mood changes, depression or anxiety. Pain:  No focal pain. Review of systems:   All other systems reviewed and found to be negative.  Physical Exam:  Blood pressure 123/61, pulse 68, temperature 98.2 F (36.8 C), temperature source Oral, resp. rate 16, height '5\' 1"'  (1.549 m), weight 181 lb 11.2 oz (82.4 kg), SpO2 97 %.  GENERAL:  Well developed, well nourished, woman lying comfortably on the medical unit in no acute distress.  She is currently receiving a unit of PRBCs. MENTAL STATUS:  Alert and oriented to person and place. HEAD:  Graying hair.  Normocephalic, atraumatic, face symmetric, no Cushingoid features. EYES:  Hazel eyes.  Pupils equal round and reactive to light and accomodation.  No conjunctivitis or scleral icterus. ENT:  Oropharynx clear without lesion.  Tongue normal.  Mucous membranes moist.  RESPIRATORY:  Clear to auscultation without rales, wheezes or rhonchi. CARDIOVASCULAR:  Regular rate and rhythm without murmur, rub or gallop. ABDOMEN:  Soft, non-tender, with active bowel sounds, and no appreciable hepatosplenomegaly.  No masses. SKIN:  No rashes, ulcers or lesions. EXTREMITIES:  Trace bilateral lower extremity edema.  No skin discoloration or tenderness.  No palpable cords. LYMPH NODES: No palpable cervical, supraclavicular, axillary or inguinal adenopathy  NEUROLOGICAL: Moving all 4 extremities.  Strength not tested. PSYCH:  Slightly sleepy post Ativan.   Results for orders placed or performed during the hospital encounter of 07/06/17 (from the past 48 hour(s))  Vitamin B12     Status: None   Collection Time: 07/06/17  9:12 AM  Result Value Ref Range   Vitamin B-12 504 180 - 914 pg/mL    Comment: (NOTE) This assay is not validated for testing neonatal or myeloproliferative syndrome specimens for Vitamin B12 levels. Performed at [facility], [place] [address] [place], [place] [address]   Comprehensive metabolic panel     Status: Abnormal   Collection Time: 07/06/17  9:36 AM  Result Value Ref Range   Sodium 140 135 - 145 mmol/L    Potassium 2.7 (  LL) 3.5 - 5.1 mmol/L    Comment: CRITICAL RESULT CALLED TO, READ BACK BY AND VERIFIED WITH LAURA CATES '@1010'  07/06/17 MGP    Chloride 105 101 - 111 mmol/L   CO2 23 22 - 32 mmol/L   Glucose, Bld 156 (H) 65 - 99 mg/dL   BUN 11 6 - 20 mg/dL   Creatinine, Ser 0.42 (L) 0.44 - 1.00 mg/dL   Calcium 8.1 (L) 8.9 - 10.3 mg/dL   Total Protein 5.9 (L) 6.5 - 8.1 g/dL   Albumin 3.2 (L) 3.5 - 5.0 g/dL   AST 20 15 - 41 U/L   ALT 10 (L) 14 - 54 U/L   Alkaline Phosphatase 88 38 - 126 U/L   Total Bilirubin 0.5 0.3 - 1.2 mg/dL   GFR calc non Af Amer >60 >60 mL/min   GFR calc Af Amer >60 >60 mL/min    Comment: (NOTE) The eGFR has been calculated using the CKD EPI equation. This calculation has not been validated in all clinical situations. eGFR's persistently <60 mL/min signify possible Chronic Kidney Disease.    Anion gap 12 5 - 15    Comment: Performed at [facility], [address] [place], [place] [address]  CBC with Differential     Status: Abnormal   Collection Time: 07/06/17  9:36 AM  Result Value Ref Range   WBC 5.9 3.6 - 11.0 K/uL   RBC 3.14 (L) 3.80 - 5.20 MIL/uL   Hemoglobin 6.7 (L) 12.0 - 16.0 g/dL   HCT 21.8 (L) 35.0 - 47.0 %   MCV 69.4 (L) 80.0 - 100.0 fL   MCH 21.4 (L) 26.0 - 34.0 pg   MCHC 30.8 (L) 32.0 - 36.0 g/dL   RDW 22.7 (H) 11.5 - 14.5 %   Platelets 314 150 - 440 K/uL   Neutrophils Relative % 66 %   Neutro Abs 3.9 1.4 - 6.5 K/uL   Lymphocytes Relative 23 %   Lymphs Abs 1.3 1.0 - 3.6 K/uL   Monocytes Relative 8 %   Monocytes Absolute 0.5 0.2 - 0.9 K/uL   Eosinophils Relative 2 %   Eosinophils Absolute 0.1 0 - 0.7 K/uL   Basophils Relative 1 %   Basophils Absolute 0.1 0 - 0.1 K/uL    Comment: Performed at [facility], [address] [place], [place] [address]  Ethanol     Status: None   Collection Time: 07/06/17  9:36 AM  Result Value Ref Range   Alcohol, Ethyl (B) <10 <10 mg/dL    Comment: (NOTE) Lowest detectable limit for  serum alcohol is 10 mg/dL. For medical purposes only. Performed at [facility], [address] [place], [place] [address]   Protime-INR     Status: None   Collection Time: 07/06/17  9:36 AM  Result Value Ref Range   Prothrombin Time 14.7 11.4 - 15.2 seconds   INR 1.16     Comment: Performed at [facility], [address] [place], [place] [address]  APTT     Status: None   Collection Time: 07/06/17  9:36 AM  Result Value Ref Range   aPTT 28 24 - 36 seconds    Comment: Performed at [facility], [address] [place], [place] [address]  Troponin I     Status: None   Collection Time: 07/06/17  9:36 AM  Result Value Ref Range   Troponin I <0.03 <0.03 ng/mL    Comment: Performed at [facility], [address] [place], [place] [address]  Glucose, capillary     Status: Abnormal   Collection Time: 07/06/17  9:55 AM  Result Value Ref Range   Glucose-Capillary 150 (H) 65 - 99 mg/dL   Comment 1 Notify RN    Comment 2 Document in Chart   Urine Drug Screen, Qualitative     Status: Abnormal   Collection Time: 07/06/17 11:14 AM  Result Value Ref Range   Tricyclic, Ur Screen NONE DETECTED NONE DETECTED   Amphetamines, Ur Screen NONE DETECTED NONE DETECTED   MDMA (Ecstasy)Ur Screen NONE DETECTED NONE DETECTED   Cocaine Metabolite,Ur [place] NONE DETECTED NONE DETECTED   Opiate, Ur Screen NONE DETECTED NONE DETECTED   Phencyclidine (PCP) Ur S NONE DETECTED NONE DETECTED   Cannabinoid 50 Ng, Ur [place] NONE DETECTED NONE DETECTED   Barbiturates, Ur Screen (A) NONE DETECTED    Result not available. Reagent lot number recalled by manufacturer.   Benzodiazepine, Ur Scrn NONE DETECTED NONE DETECTED   Methadone Scn, Ur NONE DETECTED NONE DETECTED    Comment: (NOTE) Tricyclics + metabolites, urine    Cutoff 1000 ng/mL Amphetamines + metabolites, urine  Cutoff 1000 ng/mL MDMA (Ecstasy), urine              Cutoff 500 ng/mL Cocaine Metabolite, urine           Cutoff 300 ng/mL Opiate + metabolites, urine        Cutoff 300 ng/mL Phencyclidine (PCP), urine         Cutoff 25 ng/mL Cannabinoid, urine                 Cutoff 50 ng/mL Barbiturates + metabolites, urine  Cutoff 200 ng/mL Benzodiazepine, urine              Cutoff 200 ng/mL Methadone, urine                   Cutoff 300 ng/mL The urine drug screen provides only a preliminary, unconfirmed analytical test result and should not be used for non-medical purposes. Clinical consideration and professional judgment should be applied to any positive drug screen result due to possible interfering substances. A more specific alternate chemical method must be used in order to obtain a confirmed analytical result. Gas chromatography / mass spectrometry (GC/MS) is the preferred confirmat ory method. Performed at [facility], [address] [place], [place] [address]   Urinalysis, Routine w reflex microscopic     Status: Abnormal   Collection Time: 07/06/17 11:14 AM  Result Value Ref Range   Color, Urine YELLOW (A) YELLOW   APPearance CLEAR (A) CLEAR   Specific Gravity, Urine 1.017 1.005 - 1.030   pH 6.0 5.0 - 8.0   Glucose, UA NEGATIVE NEGATIVE mg/dL   Hgb urine dipstick NEGATIVE NEGATIVE   Bilirubin Urine NEGATIVE NEGATIVE   Ketones, ur NEGATIVE NEGATIVE mg/dL   Protein, ur NEGATIVE NEGATIVE mg/dL   Nitrite NEGATIVE NEGATIVE   Leukocytes, UA NEGATIVE NEGATIVE    Comment: Performed at [facility], [address] [place], [place] [address]  Magnesium     Status: None   Collection Time: 07/06/17 12:27 PM  Result Value Ref Range   Magnesium 1.7 1.7 - 2.4 mg/dL    Comment: Performed at [facility], [address] [place],  [address]  Glucose, capillary     Status: Abnormal   Collection Time: 07/06/17  1:53 PM  Result Value Ref Range   Glucose-Capillary 106 (  H) 65 - 99 mg/dL  Type and screen [place]     Status: None  (Preliminary result)   Collection Time: 07/06/17  2:32 PM  Result Value Ref Range   ABO/RH(D) O POS    Antibody Screen NEG    Sample Expiration 07/09/2017    Unit Number [id number]    Blood Component Type RED CELLS,LR    Unit division 00    Status of Unit ISSUED    Transfusion Status OK TO TRANSFUSE    Crossmatch Result      Compatible Performed at [facility], [address] [place], [place] [address]   Prepare RBC     Status: None   Collection Time: 07/06/17  2:32 PM  Result Value Ref Range   Order Confirmation      ORDER PROCESSED BY BLOOD BANK Performed at [facility], [address], [place], [place] [address]   Phosphorus     Status: None   Collection Time: 07/06/17  2:32 PM  Result Value Ref Range   Phosphorus 3.8 2.5 - 4.6 mg/dL    Comment: Performed at [facility], [address] [place], [place] [address]  Glucose, capillary     Status: Abnormal   Collection Time: 07/06/17  4:36 PM  Result Value Ref Range   Glucose-Capillary 170 (H) 65 - 99 mg/dL   Ct Head Wo Contrast  Result Date: 07/06/2017 CLINICAL DATA:  Slight facial droop and weakness since last night at 9 p.m. EXAM: CT HEAD WITHOUT CONTRAST TECHNIQUE: Contiguous axial images were obtained from the base of the skull through the vertex without intravenous contrast. COMPARISON:  04/05/2017 FINDINGS: Brain: No evidence of acute infarction, hemorrhage, hydrocephalus, extra-axial collection or mass lesion/mass effect. Moderate remote left MCA territory infarct centered in the left parietal and posterior temporal lobes. Patchy low-density in the cerebral white matter from chronic small vessel ischemia. Small remote bilateral cerebellar infarcts. Vascular: No hyperdense vessel. Skull: No acute finding Sinuses/Orbits: Negative Other: These results were called by telephone at the time of interpretation on 07/06/2017 at 9:56 am to Dr. Conni Slipper , who verbally acknowledged these results.  IMPRESSION: 1. No acute finding. 2. Chronic ischemic injury is described above. Electronically Signed   By: Monte Fantasia M.D.   On: 07/06/2017 09:58   [name]  Result Date: 07/06/2017 CLINICAL DATA:  Focal neuro deficit. History of stroke, atrial fibrillation. On Eliquis EXAM: MRI HEAD WITHOUT CONTRAST TECHNIQUE: Multiplanar, multiecho pulse sequences of the brain and surrounding structures were obtained without intravenous contrast. COMPARISON:  MRI head 04/05/2017 FINDINGS: Brain: Left temporoparietal infarct with volume loss and mild cortical hemorrhage is noted on the prior study. Moderate chronic microvascular ischemic change throughout the white matter. Small chronic infarcts in the cerebellum bilaterally. Negative for acute infarct. 1 cm calcified extra-axial mass over the right convexity compatible with a small meningioma unchanged from prior studies. No adjacent edema in the brain. Mild to moderate atrophy unchanged. Vascular: Normal arterial flow void Skull and upper cervical spine: Negative Sinuses/Orbits: Mild mucosal edema paranasal sinuses. Normal orbit bilaterally Other: None IMPRESSION: Negative for acute infarct Atrophy with moderate to advanced chronic ischemic changes. 1 cm right convexity meningioma unchanged from prior studies. Electronically Signed   By: Franchot Gallo M.D.   On: 07/06/2017 12:56    Assessment:  The patient is a 72 y.o. woman with recurrent iron deficiency anemia.  She presented with an acute drop in hemoglobin over the past  3 months.  She notes a 2 week history of weakness.  She has black stools (? secondary to iron).  Urinalysis reveals no blood.  She denies vaginal bleeding.  Diet is fair.  Upper endoscopy on 02/20/2017 was normal.  Colonoscopy on 02/20/2017 revealed ten 7-10 mm polyps in the rectum, descending colon, transverse colon and ascending colon.  There were 2 diminutive polyps in the cecum.  Patien has a history of B12 deficiency.  She is  on supplementation.  B12 is normal.  Symptomatically, she notes fatigue.  She denies any bleeding.  Stools are black (? secondary to iron).  RBCs are again microcytic.    Plan:   1.  Hematology:  Hemoglobin has dropped from 11.9 to 6.7 in the past 3 months.  RBCs are again microcytic c/w recurrent iron deficiency.  Ferritin was 2 in 02/2017.  Suspect recurrent GI bleeding.  Guaiac all stools.  Consider GI consult (patient previously seen by Dr. Marius Ditch).  May need a capsule study.  Eliquis currently on hold.    Patient currently receiving PRBCs.  Discussed with patient consideration of IV iron (Venofer).  Risks and benefits reviewed.  Anticipate 1st weekly dose in AM.  Labs ordered for AM.   Thank you for allowing me to participate in Samantha Aguirre 's care.  I will follow her closely with you while hospitalized and after discharge in the outpatient department.   Lequita Asal, MD  07/06/2017, 8:12 PM

## 2017-07-06 NOTE — Progress Notes (Signed)
Chaplain received a PG for a Code Stroke. When chaplain arrived, medical staff were awaiting the patient's return from testing. Upon return, medical staff continued the assessment. Neurologist was consulted via videoconference. Chaplain maintained pastoral presence by patient's door. Chaplain asked for update about family's arrival; no family was present yet. After completion of assessment, nurse introduced chaplain saying that she did not have to talk, but that I had come to check in with her. Chaplain talked briefly with patient, asking if there was anything she could do/offer in that moment. Patient said no, and reached for my hand, and held it warmly. Chaplain offered brief emotional support and let patient know that she could ask the nurse to page her if necessary.     07/06/17 1000  Clinical Encounter Type  Visited With Patient  Visit Type Initial

## 2017-07-06 NOTE — Evaluation (Signed)
Clinical/Bedside Swallow Evaluation Patient Details  Name: Samantha Aguirre MRN: [id number] Date of Birth: [birth year]  Today's Date: 07/06/2017 Time: SLP Start Time (ACUTE ONLY): 7035 SLP Stop Time (ACUTE ONLY): 1515 SLP Time Calculation (min) (ACUTE ONLY): 30 min  Past Medical History:  Past Medical History:  Diagnosis Date  . Acid reflux   . Arthritis    Bilateral knees, hands, feet  . Asthma   . Atrial fibrillation ([place])   . Cancer ([place])   . Chest pain   . CHF (congestive heart failure) (HCC)    Diastolic CHF  . COPD (chronic obstructive pulmonary disease) ([place])   . Coronary artery disease   . Diabetes mellitus without complication ([place])   . Frequent headaches   . Heart attack ([place])   . High cholesterol   . HLD (hyperlipidemia)   . Hypertension   . MI (myocardial infarction) ([place])   . Seizures ([place])   . Skin cancer 2015   Suspected basal cell carcinoma on nose, surgically removed  . Stroke ([place])   . Thyroid disease   . Tremors of nervous system    Past Surgical History:  Past Surgical History:  Procedure Laterality Date  . ABDOMINAL HYSTERECTOMY  1976  . APPENDECTOMY  1980  . CAROTID ENDARTERECTOMY    . CHOLECYSTECTOMY  1980  . COLONOSCOPY N/A 02/20/2017   Procedure: COLONOSCOPY;  Surgeon: Lin Landsman, MD;  Location: [facility] ENDOSCOPY;  Service: Gastroenterology;  Laterality: N/A;  . ESOPHAGOGASTRODUODENOSCOPY N/A 02/20/2017   Procedure: ESOPHAGOGASTRODUODENOSCOPY (EGD);  Surgeon: Lin Landsman, MD;  Location: [facility] ENDOSCOPY;  Service: Gastroenterology;  Laterality: N/A;  . [place]  . KNEE SURGERY     left  . ROTATOR CUFF REPAIR     right  . TONSILLECTOMY  1950   HPI:  Per admitting H&P  Samantha Aguirre  is a 72 y.o. female with a known history of recurrent CVA, atrial fibrillation on eliquis, severe iron deficiency anemia with local G.I. bleeding as for workup with colonoscopy and endoscopy in February 2019 on iron, vitamin B12  deficiency, COPD with ongoing tobacco abuse, coronary artery disease comes to the emergency room with complaints of left upper and lower extremity weakness since last night. Patient normally uses a walker and a cane. Denies any fall. Lives at home by herself. Children visit her.       Assessment / Plan / Recommendation Clinical Impression  Patient presented with no s/s of aspiration or oropharyngeal dysphagia at bedside, tolerating trials thin liquid via cup and straw, teaspoons puree, and bites of solid well. No oral residue or transit delay noted. Laryngeal elevation appeared adequate and vocal quality remained clear throughout evaluation. Swallow initiation appeared WFL at bedside. Left side facial weakness appears to be resolved, no facial asymmetry noted. Speech noted to be clear and appropriate, no evidence of dysarthria. Recommend continue with current Heart healthy diet with thin liquids. ST to follow up to ensure toleration of Heart healthy regular diet.  SLP Visit Diagnosis: Dysphagia, oropharyngeal phase (R13.12)    Aspiration Risk    Mild   Diet Recommendation Regular;Thin liquid   Liquid Administration via: Straw;Cup Medication Administration: Whole meds with liquid Supervision: Patient able to self feed Compensations: Slow rate;Small sips/bites Postural Changes: Seated upright at 90 degrees    Other  Recommendations Oral Care Recommendations: Oral care BID   Follow up Recommendations   ST to follow for toleration of diet     Frequency and Duration min 1 x/week  1 week       Prognosis   Good     Swallow Study   General Date of Onset: 07/06/17 Type of Study: Bedside Swallow Evaluation Diet Prior to this Study: Regular Behavior/Cognition: Alert;Cooperative;Pleasant mood Oral Cavity Assessment: Within Functional Limits Oral Cavity - Dentition: Missing dentition(No lower teeth) Self-Feeding Abilities: Able to feed self Patient Positioning: Upright in bed Baseline  Vocal Quality: Normal    Oral/Motor/Sensory Function Overall Oral Motor/Sensory Function: Within functional limits   Ice Chips Ice chips: Within functional limits Presentation: Spoon   Thin Liquid Thin Liquid: Within functional limits Presentation: Cup;Straw    Nectar Thick     Honey Thick     Puree Puree: Within functional limits Presentation: Spoon   Solid   GO   Solid: Within functional limits Presentation: [place] Samantha Aguirre 07/06/2017,3:48 PM

## 2017-07-06 NOTE — Code Documentation (Signed)
Pt arrives via EMS from home, states 6/23 at 2100 pt noticed a left facial droop with difficulty speaking and left leg weakness, states she saw her neighbor at 1800 and no facial droop was noted, pt on eliquis last dose 6/23 2100 hx of TIA and CVA in April. Dr. Cinda Quest at bedside upon arrival,  Code stroke activated by Dr. Cinda Quest for VAN positive, pt taken to CT for non con head CT and then back to room 26, NIHSS 3, pt states decreased sensation left side, left facial droop and left leg drift, no tPA due to LKW > 4.5 hrs, report off to [employer]

## 2017-07-06 NOTE — Progress Notes (Signed)
Pharmacy Electrolyte Monitoring Consult:  Pharmacy consulted to assist in monitoring and replacing electrolytes in this 72 y.o. female admitted on 07/06/2017 with Facial Droop and Weakness   Labs:  Sodium (mmol/L)  Date Value  07/06/2017 140   Potassium (mmol/L)  Date Value  07/06/2017 3.3 (L)   Magnesium (mg/dL)  Date Value  07/06/2017 1.7   Phosphorus (mg/dL)  Date Value  07/06/2017 3.8   Calcium (mg/dL)  Date Value  07/06/2017 8.1 (L)   Albumin (g/dL)  Date Value  07/06/2017 3.2 (L)    Assessment/Plan: 6/24 2103 K+ 3.3. Patient has KCL 46mEq PO ordered for 2200. No additional supplementation is needed at this time.  Will F/U with AM labs and replace electrolyte as needed.  Pernell Dupre, PharmD, BCPS Clinical Pharmacist 07/06/2017 9:55 PM

## 2017-07-06 NOTE — ED Notes (Signed)
Patient transported to MRI 

## 2017-07-06 NOTE — Progress Notes (Signed)
PT Cancellation Note  Patient Details Name: Samantha Aguirre MRN: [id number] DOB: 1945-08-23   Cancelled Treatment:    Reason Eval/Treat Not Completed: Medical issues which prohibited therapy.  Awaiting transfusion and will try again in the AM.     Ramond Dial 07/06/2017, 6:25 PM   Mee Hives, PT MS Acute Rehab Dept. Number: [place] and [place]

## 2017-07-06 NOTE — Progress Notes (Signed)
Family Meeting Note  Advance Directive:yes  Today a meeting took place with the patient The following were discussed:Patient's diagnosis: she admitted with left upper and lower extremity weakness with severe iron deficiency anemia hypertension chronic a fib on eliquis. Her eye pending for evaluation of stroke., Patient's progosis: long-term prognosis poor. Patient it seems not taking much care of herself family present the ER. Discuss code status with patient. She request to be DNR.  Time spent during discussion 17 mins Fritzi Mandes, MD

## 2017-07-06 NOTE — Progress Notes (Addendum)
Rapid called for increased Lethargy, increased left sided face droop, Left sided drift of leg and arm, slurred speech at times and decreased sensitivity of face leg arm. Pt states decreased sensitivity to left "wasn't this bad before". Pt is lethargic compared to 2 hrs ago. ICU nurse, SWAT, and Nurse supervisor present during rapid VS stable. MD aware. Kyla Balzarine, LPN

## 2017-07-06 NOTE — ED Triage Notes (Signed)
Patient from home via [facility]. Reports she noticed a slight facial droop and weakness last night approximately 9 pm. Also states she had trouble forming words. Upon arrival patient noted to left sided facial droop. History of CVA in April. Also reports history of Afib and TIAs. Patient alert and oriented, following commands upon arrival.

## 2017-07-07 ENCOUNTER — Other Ambulatory Visit: Payer: Self-pay | Admitting: *Deleted

## 2017-07-07 ENCOUNTER — Ambulatory Visit: Payer: Self-pay | Admitting: *Deleted

## 2017-07-07 DIAGNOSIS — D500 Iron deficiency anemia secondary to blood loss (chronic): Secondary | ICD-10-CM

## 2017-07-07 DIAGNOSIS — R531 Weakness: Secondary | ICD-10-CM

## 2017-07-07 LAB — BASIC METABOLIC PANEL
ANION GAP: 7 (ref 5–15)
BUN: 12 mg/dL (ref 8–23)
CALCIUM: 8.4 mg/dL — AB (ref 8.9–10.3)
CHLORIDE: 111 mmol/L (ref 98–111)
CO2: 24 mmol/L (ref 22–32)
Creatinine, Ser: 0.47 mg/dL (ref 0.44–1.00)
GFR calc non Af Amer: >60 mL/min (ref >60)
Glucose, Bld: 106 mg/dL — ABNORMAL HIGH (ref 70–99)
Potassium: 4.4 mmol/L (ref 3.5–5.1)
SODIUM: 142 mmol/L (ref 135–145)

## 2017-07-07 LAB — GLUCOSE, CAPILLARY
GLUCOSE-CAPILLARY: 64 mg/dL — AB (ref 70–99)
GLUCOSE-CAPILLARY: 67 mg/dL — AB (ref 70–99)
GLUCOSE-CAPILLARY: 136 mg/dL — AB (ref 70–99)
Glucose-Capillary: 100 mg/dL — ABNORMAL HIGH (ref 70–99)
Glucose-Capillary: 134 mg/dL — ABNORMAL HIGH (ref 70–99)
Glucose-Capillary: 103 mg/dL — ABNORMAL HIGH (ref 70–99)

## 2017-07-07 LAB — TYPE AND SCREEN
ABO/RH(D): O POS
ANTIBODY SCREEN: NEGATIVE
Unit division: 0

## 2017-07-07 LAB — HEMOGLOBIN A1C
HEMOGLOBIN A1C: 6.2 % — AB (ref 4.8–5.6)
MEAN PLASMA GLUCOSE: 131.24 mg/dL

## 2017-07-07 LAB — URIC ACID: URIC ACID, SERUM: 3.7 mg/dL (ref 2.5–7.1)

## 2017-07-07 LAB — RETICULOCYTES
RBC.: 3.59 MIL/uL — ABNORMAL LOW (ref 3.80–5.20)
Retic Count, Absolute: 118.5 10*3/uL (ref 19.0–183.0)
Retic Ct Pct: 3.3 % — ABNORMAL HIGH (ref 0.4–3.1)

## 2017-07-07 LAB — LIPID PANEL
CHOL/HDL RATIO: 3.6 ratio
CHOLESTEROL: 123 mg/dL (ref 0–200)
HDL: 34 mg/dL — ABNORMAL LOW (ref >40)
LDL Cholesterol: 56 mg/dL (ref 0–99)
Triglycerides: 164 mg/dL — ABNORMAL HIGH (ref <150)
VLDL: 33 mg/dL (ref 0–40)

## 2017-07-07 LAB — FERRITIN: Ferritin: 17 ng/mL (ref 11–307)

## 2017-07-07 LAB — BPAM RBC
BLOOD PRODUCT EXPIRATION DATE: 201907192359
ISSUE DATE / TIME: 201906241710
UNIT TYPE AND RH: 5100

## 2017-07-07 LAB — SEDIMENTATION RATE: Sed Rate: 35 mm/hr — ABNORMAL HIGH (ref 0–30)

## 2017-07-07 NOTE — Progress Notes (Signed)
Notified by CCMD that pt had a run of SVT/PVC's with HR in the 130's. Pt is asymptomatic and back to NSR with HR in the 70's. MD notified via text message. Will continue to monitor.

## 2017-07-07 NOTE — Progress Notes (Signed)
OT Cancellation Note  Patient Details Name: Samantha Aguirre MRN: [id number] DOB: 1945/07/14   Cancelled Treatment:    Reason Eval/Treat Not Completed: Other (comment). Pt received blood transfusion yesterday, new labs pending (Hgb from previous date 6.7). Will hold OT evaluation this am and re-attempt this afternoon pending new labs and pt being medically appropriate.   Jeni Salles, MPH, MS, OTR/L ascom [phone] 07/07/17, 8:13 AM

## 2017-07-07 NOTE — Patient Outreach (Signed)
[facility]) Care Management  07/07/2017  Samantha Aguirre 1945-10-11 [id number]   Pt admitted to rehab after recent hospitalization  March 22-27,2019 for COPD, Stroke (acute CVA) with right side weakness PMHx includes but not limited to diabetes, CAD, chronic atrial fib,low back pain, hypertension, GERD, MI .    Noted patient inpatient admission on 6/25, will send in basket message to [facility] for follow up.    [name], RN, [place] Management Coordinator  [phone]- Mobile [phone]- Toll Free Main Office

## 2017-07-07 NOTE — Progress Notes (Signed)
Subjective: Patient continues to complain of left shoulder and left knee pain.  Also reports some areas of pain elsewhere as well.    Objective: Current vital signs: BP (!) 135/59 (BP Location: Left Arm)   Pulse 64   Temp 97.8 F (36.6 C) (Oral)   Resp 18   Ht 5\' 1"  (1.549 m)   Wt 82.4 kg (181 lb 11.2 oz)   SpO2 99%   BMI 34.33 kg/m  Vital signs in last 24 hours: Temp:  [97.5 F (36.4 C)-98.2 F (36.8 C)] 97.8 F (36.6 C) (06/25 0842) Pulse Rate:  [58-70] 64 (06/25 0842) Resp:  [16-18] 18 (06/25 0842) BP: (106-146)/(55-69) 135/59 (06/25 0842) SpO2:  [95 %-100 %] 99 % (06/25 0842) Weight:  [82.4 kg (181 lb 11.2 oz)] 82.4 kg (181 lb 11.2 oz) (06/24 1259)  Intake/Output from previous day: 06/24 0701 - 06/25 0700 In: 379 [Blood:379] Out: -  Intake/Output this shift: No intake/output data recorded. Nutritional status:  Diet Order           Diet heart healthy/carb modified Room service appropriate? Yes; Fluid consistency: Thin  Diet effective now          Neurologic Exam: Mental Status: Alert, oriented, thought content appropriate.  Speech fluent without evidence of aphasia.  Able to follow 3 step commands without difficulty. Cranial Nerves: II: Discs flat bilaterally; Visual fields grossly normal, pupils equal, round, reactive to light and accommodation III,IV, VI: ptosis not present, extra-ocular motions intact bilaterally V,VII: smile symmetric, facial light touch sensation normal bilaterally VIII: hearing normal bilaterally IX,X: gag reflex present XI: bilateral shoulder shrug XII: midline tongue extension Motor: Limited ROM on the left secondary to pain.   Sensory: Pinprick and light touch intact throughout, bilaterally   Lab Results: Basic Metabolic Panel: Recent Labs  Lab 07/06/17 0936 07/06/17 1227 07/06/17 1432 07/06/17 2103 07/07/17 0447  NA 140  --   --   --  142  K 2.7*  --   --  3.3* 4.4  CL 105  --   --   --  111  CO2 23  --   --   --  24   GLUCOSE 156*  --   --   --  106*  BUN 11  --   --   --  12  CREATININE 0.42*  --   --   --  0.47  CALCIUM 8.1*  --   --   --  8.4*  MG  --  1.7  --   --   --   PHOS  --   --  3.8  --   --     Liver Function Tests: Recent Labs  Lab 07/06/17 0936  AST 20  ALT 10*  ALKPHOS 88  BILITOT 0.5  PROT 5.9*  ALBUMIN 3.2*   No results for input(s): LIPASE, AMYLASE in the last 168 hours. No results for input(s): AMMONIA in the last 168 hours.  CBC: Recent Labs  Lab 07/06/17 0936  WBC 5.9  NEUTROABS 3.9  HGB 6.7*  HCT 21.8*  MCV 69.4*  PLT 314    Cardiac Enzymes: Recent Labs  Lab 07/06/17 0936  TROPONINI <0.03    Lipid Panel: Recent Labs  Lab 07/07/17 0447  CHOL 123  TRIG 164*  HDL 34*  CHOLHDL 3.6  VLDL 33  LDLCALC 56    CBG: Recent Labs  Lab 07/06/17 0955 07/06/17 1353 07/06/17 1636 07/07/17 0737  GLUCAP 150* 106* 170* 100*  Microbiology: Results for orders placed or performed during the hospital encounter of 03/27/17  Culture, blood (routine x 2)     Status: None   Collection Time: 03/27/17 12:44 PM  Result Value Ref Range Status   Specimen Description BLOOD LAC  Final   Special Requests   Final    BOTTLES DRAWN AEROBIC AND ANAEROBIC Blood Culture adequate volume   Culture   Final    NO GROWTH 5 DAYS Performed at [facility], [address], [place], [place] [address]    Report Status 04/01/2017 FINAL  Final  Culture, blood (routine x 2)     Status: None   Collection Time: 03/27/17 12:44 PM  Result Value Ref Range Status   Specimen Description BLOOD RAC  Final   Special Requests   Final    BOTTLES DRAWN AEROBIC AND ANAEROBIC Blood Culture adequate volume   Culture   Final    NO GROWTH 5 DAYS Performed at [facility], [address] [place],  [address]    Report Status 04/01/2017 FINAL  Final    Coagulation Studies: Recent Labs    07/06/17 0936  LABPROT 14.7  INR 1.16    Imaging: Ct Head Wo  Contrast  Result Date: 07/06/2017 CLINICAL DATA:  Slight facial droop and weakness since last night at 9 p.m. EXAM: CT HEAD WITHOUT CONTRAST TECHNIQUE: Contiguous axial images were obtained from the base of the skull through the vertex without intravenous contrast. COMPARISON:  04/05/2017 FINDINGS: Brain: No evidence of acute infarction, hemorrhage, hydrocephalus, extra-axial collection or mass lesion/mass effect. Moderate remote left MCA territory infarct centered in the left parietal and posterior temporal lobes. Patchy low-density in the cerebral white matter from chronic small vessel ischemia. Small remote bilateral cerebellar infarcts. Vascular: No hyperdense vessel. Skull: No acute finding Sinuses/Orbits: Negative Other: These results were called by telephone at the time of interpretation on 07/06/2017 at 9:56 am to Dr. Conni Slipper , who verbally acknowledged these results. IMPRESSION: 1. No acute finding. 2. Chronic ischemic injury is described above. Electronically Signed   By: Monte Fantasia M.D.   On: 07/06/2017 09:58   [name]  Result Date: 07/06/2017 CLINICAL DATA:  Focal neuro deficit. History of stroke, atrial fibrillation. On Eliquis EXAM: MRI HEAD WITHOUT CONTRAST TECHNIQUE: Multiplanar, multiecho pulse sequences of the brain and surrounding structures were obtained without intravenous contrast. COMPARISON:  MRI head 04/05/2017 FINDINGS: Brain: Left temporoparietal infarct with volume loss and mild cortical hemorrhage is noted on the prior study. Moderate chronic microvascular ischemic change throughout the white matter. Small chronic infarcts in the cerebellum bilaterally. Negative for acute infarct. 1 cm calcified extra-axial mass over the right convexity compatible with a small meningioma unchanged from prior studies. No adjacent edema in the brain. Mild to moderate atrophy unchanged. Vascular: Normal arterial flow void Skull and upper cervical spine: Negative Sinuses/Orbits:  Mild mucosal edema paranasal sinuses. Normal orbit bilaterally Other: None IMPRESSION: Negative for acute infarct Atrophy with moderate to advanced chronic ischemic changes. 1 cm right convexity meningioma unchanged from prior studies. Electronically Signed   By: Franchot Gallo M.D.   On: 07/06/2017 12:56    Medications:  I have reviewed the patient's current medications. Scheduled: . aspirin  300 mg Rectal Daily   Or  . aspirin  325 mg Oral Daily  . atorvastatin  80 mg Oral q1800  . DULoxetine  60 mg Oral QHS  . enoxaparin (LOVENOX) injection  40 mg Subcutaneous Q24H  .  ferrous sulfate  325 mg Oral Q24H  . fluticasone  1 spray Each Nare Daily  . furosemide  10 mg Oral Daily  . gabapentin  800 mg Oral BID  . glipiZIDE  5 mg Oral BID AC  . insulin aspart  0-9 Units Subcutaneous TID WC  . levETIRAcetam  500 mg Oral BID  . levothyroxine  112 mcg Oral QAC breakfast  . lisinopril  20 mg Oral Daily  . metFORMIN  500 mg Oral BID WC  . pantoprazole  40 mg Oral Daily  . potassium chloride  20 mEq Oral TID  . vitamin B-12  1,000 mcg Oral Daily    Assessment/Plan: No new complaints.  Continues to complain of pain.  MRI of the brain shows no acute changes.  LDL 56.  A1c pending.  Anemic on presentation.  Currently off anticoagulation until deemed safe from a GI standpoint.    Recommendations: 1. Continue neuro checks    LOS: 1 day   Alexis Goodell, MD Neurology [phone] 07/07/2017  10:54 AM

## 2017-07-07 NOTE — Progress Notes (Signed)
Pharmacy Electrolyte Monitoring Consult:  Pharmacy consulted to assist in monitoring and replacing electrolytes in this 72 y.o. female admitted on 07/06/2017 with Facial Droop and Weakness   Labs:  Sodium (mmol/L)  Date Value  07/07/2017 142   Potassium (mmol/L)  Date Value  07/07/2017 4.4   Magnesium (mg/dL)  Date Value  07/06/2017 1.7   Phosphorus (mg/dL)  Date Value  07/06/2017 3.8   Calcium (mg/dL)  Date Value  07/07/2017 8.4 (L)   Albumin (g/dL)  Date Value  07/06/2017 3.2 (L)    Assessment/Plan: Electrolytes are WNL. Will f/u AM labs.   Ulice Dash D 07/07/2017 3:04 PM

## 2017-07-07 NOTE — Evaluation (Signed)
Physical Therapy Evaluation Patient Details Name: Samantha Aguirre MRN: [id number] DOB: 1945-02-18 Today's Date: 07/07/2017   History of Present Illness  Pt is a 72 y.o. female with a known history of diastolic heart failure, bronchial asthma, atrial fibrillation, COPD, hyperlipidemia, diabetes mellitus, seizure disorder, skin cancer and recurrent CVA.  Here with some new onset L sided weakness, CT and MRI negative for acute CVA.  Clinical Impression  Pt functionally did very well with PT exam showing ability to get to sitting, standing and do prolonged bout of ambulation w/o direct assist.  She had h/o CVAs with some L sided weakness, subjectively she feels like now it a little worse than baseline, some L U&LE weakness on testing today though generally functional and unsure as to comparison with baseline.  Pt more reliant on walker (uses cane at baseline for home distances) and minimally hesitant, would benefit from HHPT to get back to her baseline.    Follow Up Recommendations Home health PT    Equipment Recommendations       Recommendations for Other Services       Precautions / Restrictions Precautions Precautions: Fall      Mobility  Bed Mobility Overal bed mobility: Modified Independent             General bed mobility comments: Pt was able to get herself to sitting EOB w/o direct assist  Transfers Overall transfer level: Modified independent Equipment used: Rolling walker (2 wheeled)             General transfer comment: Cues for set up and handplacement, but pt was able to rise to standing w/o direct assist  Ambulation/Gait Ambulation/Gait assistance: Supervision Gait Distance (Feet): 200 Feet Assistive device: Rolling walker (2 wheeled)       General Gait Details: Pt walked with good cadence and confidence.  She did well with prolonged ambulation with no LOBs and only minimal fatigue (she did need one standing rest break)   Stairs             Wheelchair Mobility    Modified Rankin (Stroke Patients Only)       Balance Overall balance assessment: Modified Independent                                           Pertinent Vitals/Pain Pain Assessment: No/denies pain    Home Living Family/patient expects to be discharged to:: Private residence Living Arrangements: Alone Available Help at Discharge: Friend(s);Available PRN/intermittently Type of Home: Apartment Home Access: Level entry     Home Layout: One level Home Equipment: Walker - 2 wheels;Tub bench;Cane - quad      Prior Function Level of Independence: Independent with assistive device(s)         Comments: Pt able to do in home and limited community ambulation (cane in home, walker out of home, does use scooter while shopping)     Hand Dominance   Dominant Hand: Right    Extremity/Trunk Assessment   Upper Extremity Assessment Upper Extremity Assessment: Generalized weakness(L side weaker than R (some L weakness at baseline anyway))    Lower Extremity Assessment Lower Extremity Assessment: Overall WFL for tasks assessed;Generalized weakness(L mildly weaker than R, again some baseline weakness anyway)       Communication   Communication: No difficulties  Cognition Arousal/Alertness: Awake/alert Behavior During Therapy: WFL for tasks assessed/performed Overall Cognitive Status: Within Functional  Limits for tasks assessed                                        General Comments      Exercises     Assessment/Plan    PT Assessment Patient needs continued PT services  PT Problem List Decreased strength;Decreased range of motion;Decreased activity tolerance;Decreased balance;Decreased mobility;Decreased knowledge of use of DME;Decreased safety awareness;Cardiopulmonary status limiting activity       PT Treatment Interventions DME instruction;Gait training;Stair training;Functional mobility  training;Therapeutic activities;Therapeutic exercise;Balance training;Neuromuscular re-education;Patient/family education    PT Goals (Current goals can be found in the Care Plan section)  Acute Rehab PT Goals Patient Stated Goal: Go home PT Goal Formulation: With patient Time For Goal Achievement: 07/07/17 Potential to Achieve Goals: Good    Frequency Min 2X/week   Barriers to discharge        Co-evaluation               AM-PAC PT "6 Clicks" Daily Activity  Outcome Measure Difficulty turning over in bed (including adjusting bedclothes, sheets and blankets)?: A Little Difficulty moving from lying on back to sitting on the side of the bed? : A Little Difficulty sitting down on and standing up from a chair with arms (e.g., wheelchair, bedside commode, etc,.)?: A Little Help needed moving to and from a bed to chair (including a wheelchair)?: None Help needed walking in hospital room?: None Help needed climbing 3-5 steps with a railing? : A Little 6 Click Score: 20    End of Session Equipment Utilized During Treatment: Gait belt Activity Tolerance: Patient tolerated treatment well;Patient limited by fatigue Patient left: with chair alarm set;with call bell/phone within reach Nurse Communication: Mobility status      Time: 6269-4854 PT Time Calculation (min) (ACUTE ONLY): 27 min   Charges:   PT Evaluation $PT Eval Low Complexity: 1 Low     PT G CodesKreg Shropshire, DPT 07/07/2017, 1:21 PM

## 2017-07-07 NOTE — Evaluation (Signed)
Occupational Therapy Evaluation Patient Details Name: Samantha Aguirre MRN: [id number] DOB: 1945-08-13 Today's Date: 07/07/2017    History of Present Illness Pt is a 72 y.o. female with a known history of diastolic heart failure, bronchial asthma, atrial fibrillation, COPD, hyperlipidemia, diabetes mellitus, seizure disorder, skin cancer and recurrent CVA.  Here with some new onset L sided weakness, CT and MRI negative for acute CVA.   Clinical Impression   Pt seen for OT evaluation this date. Prior to hospital admission, pt was modified indep using SPC in the home and generally able to do most of what she needed to by herself but endorses difficulty some days. Pt lives alone and has friends or family provide transportation as needed. Neighbors check on each other. Pt endorses no one who could provide substantial or consistent assist.  Currently pt demonstrates impairments in vision in both eyes (hx cataracts with blurriness R worse than L, wears reading glasses, and endorses double vision - see details below), cognition (decreased insight into deficits and safety), strength (L<R), sensation (hx of neuropathy and present bilaterally BUE and BLE), and poor activity tolerance, requiring supervision to SBA for mobility and functional transfers w/ RW and min assist for LB ADL.   Pt educated in AE/DME for toileting, bathing, and dressing to improve safety and independence while also manage fatigue (see recs below). Pt fearful of using tub shower at home despite grab bars but expresses significant interest in a tub transfer bench to improve safety. Pt educated in principles of energy conservation and eager to learn more to help her during daily routines and mobility to improve safety and independence. Pt would benefit from skilled OT to address noted impairments and functional limitations (see below for any additional details) in order to maximize safety and independence while minimizing falls risk and  caregiver burden, including energy conservation strategies, low vision modifications/compensatory strategies, falls prevention, home safety, and instruction in AE/DME for ADL and light meal prep and cleaning.  Upon hospital discharge, recommend pt discharge home with supervision and [place] services.    Follow Up Recommendations  Home health OT;Supervision - Intermittent    Equipment Recommendations  Tub/shower bench;Toilet rise with handles;Other (comment)(HH shower head, LH sponge, LH shoe horn)    Recommendations for Other Services       Precautions / Restrictions Precautions Precautions: Fall Restrictions Weight Bearing Restrictions: No      Mobility Bed Mobility Overal bed mobility: Modified Independent             General bed mobility comments: + time/effort  Transfers Overall transfer level: Modified independent Equipment used: Rolling walker (2 wheeled)                  Balance Overall balance assessment: Modified Independent;History of Falls                                         ADL either performed or assessed with clinical judgement   ADL Overall ADL's : Needs assistance/impaired Eating/Feeding: Bed level;Set up Eating/Feeding Details (indicate cue type and reason): would benefit from low vision strategies and AE to maximize independance and safety with self feeding Grooming: Bed level;Set up;Minimal assistance Grooming Details (indicate cue type and reason): 2:2 decreased shoulder flexion, but generally able to perform as needed, may benefit from longer handled comb/brush to reach back of her head more easily Upper Body Bathing: Sitting;Minimal assistance Upper  Body Bathing Details (indicate cue type and reason): would benefit from [facility] sponge to wash back Lower Body Bathing: Sitting/lateral leans;Minimal assistance Lower Body Bathing Details (indicate cue type and reason): would benefit from [facility] sponge for LB bathing Upper Body  Dressing : Sitting;Supervision/safety Upper Body Dressing Details (indicate cue type and reason): additional time/effort to perform Lower Body Dressing: Sitting/lateral leans;Minimal assistance Lower Body Dressing Details (indicate cue type and reason): educated in AE for LB dressing, would benefit from [facility] shoe horn, has Investment banker, operational Transfer: RW;Ambulation;Comfort height toilet;Supervision/safety Toilet Transfer Details (indicate cue type and reason): educated in toilet riser w/ rails to improve safety and independence with toilet transfers at home, pt states she pulls up on counter to help get up from "low" commode at home       Tub/Shower Transfer Details (indicate cue type and reason): pt educated in use of TTB for tub/shower at home to improve safety, independence, and confidence. Pt states she currently feels "very unsafe" attempting to get in/out of the tub shower currently (has shower chair at home), would benefit from additional instruction and practice  Functional mobility during ADLs: Supervision/safety;Rolling walker       Vision Baseline Vision/History: Cataracts;Wears glasses(reports R eye worse than L, blurry vision 2:2 cataracts in both, was supposed to get cataract surgery but "never got the paperwork from the doctor to do it") Wears Glasses: Reading only Patient Visual Report: Blurring of vision;Diplopia Vision Assessment?: Yes Eye Alignment: Within Functional Limits Ocular Range of Motion: Within Functional Limits Alignment/Gaze Preference: Within Defined Limits Tracking/Visual Pursuits: Able to track stimulus in all quads without difficulty Convergence: Within functional limits Visual Fields: No apparent deficits Diplopia Assessment: Disappears with one eye closed;Objects split on top of one another;Objects split side to side;Only with right gaze Additional Comments: pt reports intermittent double vision when she turns head/vision to the right with objects sometimes  side to side and sometimes on top of one another, goes away when she closes her R eye.      Perception     Praxis      Pertinent Vitals/Pain Pain Assessment: No/denies pain     Hand Dominance Right   Extremity/Trunk Assessment Upper Extremity Assessment Upper Extremity Assessment: Generalized weakness;RUE deficits/detail;LUE deficits/detail RUE Deficits / Details: weak, grossly 4/5, hx neuropathy and pt reports N/T  RUE Sensation: decreased light touch;history of peripheral neuropathy LUE Deficits / Details: baseline LUE weakness from previous CVA but pt feels subjectively weaker than baseline; shoulder flexion 4-/5, grip 4-/5, elbow flex/ext 4/5, pt reports N/T bilaterally but L side worse at baseline and has neuropathy, impaired coordination from previous CVA LUE Sensation: history of peripheral neuropathy;decreased light touch LUE Coordination: decreased gross motor;decreased fine motor   Lower Extremity Assessment Lower Extremity Assessment: Generalized weakness(bilat weak, neuropathy and N/T bilaterally)   Cervical / Trunk Assessment Cervical / Trunk Assessment: Normal   Communication Communication Communication: No difficulties   Cognition Arousal/Alertness: Awake/alert Behavior During Therapy: WFL for tasks assessed/performed Overall Cognitive Status: No family/caregiver present to determine baseline cognitive functioning                                 General Comments: Pt oriented to self, month/year, and place; follows simple commands well, mild deficits noted with insight and safety awareness/awareness of deficits (masks fairly well)   General Comments       Exercises Other Exercises Other Exercises: pt educated in energy conservation principles,  eager to learn more next session to support energy and fatigue mgt at home 2:2 her COPD and CHF Other Exercises: pt educated in DME/AE to improve toilet transfers, tub transfers, bathing, and dressing    Shoulder Instructions      Home Living Family/patient expects to be discharged to:: Private residence Living Arrangements: Alone Available Help at Discharge: Friend(s);Available PRN/intermittently(friends and elderly neighbors available PRN for limited assist) Type of Home: Apartment Home Access: Level entry     Home Layout: One level     Bathroom Shower/Tub: Teacher, early years/pre: Standard Bathroom Accessibility: Yes How Accessible: Accessible via walker Home Equipment: Roy - 2 wheels;Cane - quad;Shower seat;Adaptive equipment Adaptive Equipment: Reacher        Prior Functioning/Environment Level of Independence: Independent with assistive device(s);Needs assistance  Gait / Transfers Assistance Needed: Pt able to do in home and limited community ambulation (cane in home, walker out of home, does use scooter while shopping). ADL's / Homemaking Assistance Needed: friends/family drive her to appts and grocery store; gets meds via blister packs, able to do basic ADL and light meal prep and cleaning "most of the time by myself".    Comments: Endorses 2 falls in past 12 months        OT Problem List: Decreased strength;Impaired vision/perception;Decreased knowledge of use of DME or AE;Decreased range of motion;Decreased activity tolerance;Decreased cognition;Impaired UE functional use;Impaired sensation;Decreased safety awareness      OT Treatment/Interventions: Self-care/ADL training;Therapeutic exercise;Therapeutic activities;Energy conservation;Cognitive remediation/compensation;DME and/or AE instruction;Patient/family education;Visual/perceptual remediation/compensation    OT Goals(Current goals can be found in the care plan section) Acute Rehab OT Goals Patient Stated Goal: Go home OT Goal Formulation: With patient Time For Goal Achievement: 07/21/17 Potential to Achieve Goals: Good ADL Goals Pt Will Perform Lower Body Dressing: with supervision;sit  to/from stand;with adaptive equipment Pt Will Transfer to Toilet: with supervision;ambulating(elevated commode, LRAD for amb) Additional ADL Goal #1: Pt will verbalize plan to implement at least 1 learned ECS to maximize safety and independence at home. Additional ADL Goal #2: Pt will verbalize plan to implement at least 1 low vision compensatory strategy to maximize safety and independence in the home.  OT Frequency: Min 2X/week   Barriers to D/C: Decreased caregiver support          Co-evaluation              AM-PAC PT "6 Clicks" Daily Activity     Outcome Measure Help from another person eating meals?: A Little Help from another person taking care of personal grooming?: A Little Help from another person toileting, which includes using toliet, bedpan, or urinal?: A Little Help from another person bathing (including washing, rinsing, drying)?: A Little Help from another person to put on and taking off regular upper body clothing?: A Little Help from another person to put on and taking off regular lower body clothing?: A Little 6 Click Score: 18   End of Session    Activity Tolerance: Patient tolerated treatment well Patient left: in bed;with call bell/phone within reach;with bed alarm set  OT Visit Diagnosis: Other abnormalities of gait and mobility (R26.89);History of falling (Z91.81);Muscle weakness (generalized) (M62.81);Other symptoms and signs involving cognitive function;Low vision, both eyes (H54.2)                Time: 0175-1025 OT Time Calculation (min): 41 min Charges:  OT General Charges $OT Visit: 1 Visit OT Evaluation $OT Eval Low Complexity: 1 Low OT Treatments $Self Care/Home Management : 23-37  mins  Jeni Salles, MPH, MS, OTR/L ascom [phone] 07/07/17, 4:47 PM

## 2017-07-07 NOTE — Progress Notes (Signed)
Chart reviewed. Pt is tolerating current diet without s/s of aspiration. Please reconsult if any further speech or swallowing concerns arise.

## 2017-07-07 NOTE — Progress Notes (Signed)
[facility] Hematology/Oncology Progress Note  Date of admission: 07/06/2017  Hospital day:  07/07/2017  Chief Complaint: Samantha Aguirre is a 72 y.o. female with a history of iron deficiency who was admitted with left sided weakness and severe microcytic anemia.  Subjective:  Feels tired today.  No new symptoms.  Denies bleeding.  Social History: The patient is alone today.  Allergies:  Allergies  Allergen Reactions  . Mushroom Extract Complex Anaphylaxis    Made patient "deathly sick"  . Atenolol     "MD took me off of it bc it was doing something wrong" Made patient HYPOTENSIVE  . Ivp Dye [Iodinated Diagnostic Agents] Itching    Pt injected with IV contrast.  5 min after injection pt complained of itching behind ear and on her abd.  . Morphine And Related Other (See Comments)    "stops my breathing" NEAR-RESPIRATORY ARREST  . Percocet [Oxycodone-Acetaminophen] Nausea And Vomiting and Other (See Comments)    Stomach pains  . Percodan [Oxycodone-Aspirin] Nausea And Vomiting and Other (See Comments)    Stomach pains  . Verapamil Other (See Comments)    " MD told me this was screwing me up" MAKES PATIENT HYPOTENSIVE    Scheduled Medications: . aspirin  300 mg Rectal Daily   Or  . aspirin  325 mg Oral Daily  . atorvastatin  80 mg Oral q1800  . DULoxetine  60 mg Oral QHS  . enoxaparin (LOVENOX) injection  40 mg Subcutaneous Q24H  . ferrous sulfate  325 mg Oral Q24H  . fluticasone  1 spray Each Nare Daily  . furosemide  10 mg Oral Daily  . gabapentin  800 mg Oral BID  . glipiZIDE  5 mg Oral BID AC  . insulin aspart  0-9 Units Subcutaneous TID WC  . levETIRAcetam  500 mg Oral BID  . levothyroxine  112 mcg Oral QAC breakfast  . lisinopril  20 mg Oral Daily  . metFORMIN  500 mg Oral BID WC  . pantoprazole  40 mg Oral Daily  . potassium chloride  20 mEq Oral TID  . vitamin B-12  1,000 mcg Oral Daily    Review of Systems: GENERAL:  Fatigue.  No  fevers, sweats or weight loss. PERFORMANCE STATUS (ECOG):  1-2 HEENT:  No visual changes, runny nose, sore throat, mouth sores or tenderness. Lungs: No shortness of breath or cough.  No hemoptysis. Cardiac:  No chest pain, palpitations, orthopnea, or PND. GI:  h/o black stool.  No nausea, vomiting, diarrhea, constipation, or hematochezia. GU:  No urgency, frequency, dysuria, or hematuria. Musculoskeletal:  No back pain.  No joint pain.  No muscle tenderness. Extremities:  No pain or swelling. Skin:  No rashes or skin changes. Neuro:  No headache, numbness or weakness, balance or coordination issues. Endocrine:  No diabetes, thyroid issues, hot flashes or night sweats. Psych:  No mood changes, depression or anxiety. Pain:  No focal pain. Review of systems:  All other systems reviewed and found to be negative.  Physical Exam: Blood pressure 111/74, pulse 64, temperature 98.1 F (36.7 C), temperature source Oral, resp. rate 18, height '5\' 1"'  (1.549 m), weight 181 lb 11.2 oz (82.4 kg), SpO2 99 %.  GENERAL:  Well developed, well nourished, woman resting on the medical oncology unit in no acute distress.  Appears older than stated age. MENTAL STATUS:  Alert and oriented to person, place and time. HEAD:  Graying shoulder length hair.  Normocephalic, atraumatic, face symmetric, no Cushingoid  features. EYES:  Hazel eyes.  Pupils equal round and reactive to light and accomodation.  No conjunctivitis or scleral icterus. ENT:  Oropharynx clear without lesion.  Tongue normal. Mucous membranes moist.  RESPIRATORY:  Clear to auscultation anteriorly without rales, wheezes or rhonchi. CARDIOVASCULAR:  Regular rate and rhythm without murmur, rub or gallop. ABDOMEN:  Soft, non-tender, with active bowel sounds, and no hepatosplenomegaly.  No masses. SKIN:  No rashes, ulcers or lesions. EXTREMITIES: No edema, no skin discoloration or tenderness.  No palpable cords. LYMPH NODES: No palpable cervical,  supraclavicular, axillary or inguinal adenopathy  NEUROLOGICAL: Unremarkable. PSYCH:  Appropriate.  Results for orders placed or performed during the hospital encounter of 07/06/17 (from the past 48 hour(s))  Vitamin B12     Status: None   Collection Time: 07/06/17  9:12 AM  Result Value Ref Range   Vitamin B-12 504 180 - 914 pg/mL    Comment: (NOTE) This assay is not validated for testing neonatal or myeloproliferative syndrome specimens for Vitamin B12 levels. Performed at [facility], [place] [address], [place], [place] [address]   Comprehensive metabolic panel     Status: Abnormal   Collection Time: 07/06/17  9:36 AM  Result Value Ref Range   Sodium 140 135 - 145 mmol/L   Potassium 2.7 (LL) 3.5 - 5.1 mmol/L    Comment: CRITICAL RESULT CALLED TO, READ BACK BY AND VERIFIED WITH LAURA CATES '@1010'  07/06/17 MGP    Chloride 105 101 - 111 mmol/L   CO2 23 22 - 32 mmol/L   Glucose, Bld 156 (H) 65 - 99 mg/dL   BUN 11 6 - 20 mg/dL   Creatinine, Ser 0.42 (L) 0.44 - 1.00 mg/dL   Calcium 8.1 (L) 8.9 - 10.3 mg/dL   Total Protein 5.9 (L) 6.5 - 8.1 g/dL   Albumin 3.2 (L) 3.5 - 5.0 g/dL   AST 20 15 - 41 U/L   ALT 10 (L) 14 - 54 U/L   Alkaline Phosphatase 88 38 - 126 U/L   Total Bilirubin 0.5 0.3 - 1.2 mg/dL   GFR calc non Af Amer >60 >60 mL/min   GFR calc Af Amer >60 >60 mL/min    Comment: (NOTE) The eGFR has been calculated using the CKD EPI equation. This calculation has not been validated in all clinical situations. eGFR's persistently <60 mL/min signify possible Chronic Kidney Disease.    Anion gap 12 5 - 15    Comment: Performed at [facility], [address] [place], [place] [address]  CBC with Differential     Status: Abnormal   Collection Time: 07/06/17  9:36 AM  Result Value Ref Range   WBC 5.9 3.6 - 11.0 K/uL   RBC 3.14 (L) 3.80 - 5.20 MIL/uL   Hemoglobin 6.7 (L) 12.0 - 16.0 g/dL   HCT 21.8 (L) 35.0 - 47.0 %   MCV 69.4 (L) 80.0 - 100.0 fL   MCH 21.4 (L)  26.0 - 34.0 pg   MCHC 30.8 (L) 32.0 - 36.0 g/dL   RDW 22.7 (H) 11.5 - 14.5 %   Platelets 314 150 - 440 K/uL   Neutrophils Relative % 66 %   Neutro Abs 3.9 1.4 - 6.5 K/uL   Lymphocytes Relative 23 %   Lymphs Abs 1.3 1.0 - 3.6 K/uL   Monocytes Relative 8 %   Monocytes Absolute 0.5 0.2 - 0.9 K/uL   Eosinophils Relative 2 %   Eosinophils Absolute 0.1 0 - 0.7 K/uL  Basophils Relative 1 %   Basophils Absolute 0.1 0 - 0.1 K/uL    Comment: Performed at [facility], [address] [place], [place] [address]  Ethanol     Status: None   Collection Time: 07/06/17  9:36 AM  Result Value Ref Range   Alcohol, Ethyl (B) <10 <10 mg/dL    Comment: (NOTE) Lowest detectable limit for serum alcohol is 10 mg/dL. For medical purposes only. Performed at [facility], [address] [place], [place] [address]   Protime-INR     Status: None   Collection Time: 07/06/17  9:36 AM  Result Value Ref Range   Prothrombin Time 14.7 11.4 - 15.2 seconds   INR 1.16     Comment: Performed at [facility], [address] [place] [employer], [place] [address]  APTT     Status: None   Collection Time: 07/06/17  9:36 AM  Result Value Ref Range   aPTT 28 24 - 36 seconds    Comment: Performed at [facility], [address] [place], [place] [address]  Troponin I     Status: None   Collection Time: 07/06/17  9:36 AM  Result Value Ref Range   Troponin I <0.03 <0.03 ng/mL    Comment: Performed at [facility], [address] [place], [place] [address]  Glucose, capillary     Status: Abnormal   Collection Time: 07/06/17  9:55 AM  Result Value Ref Range   Glucose-Capillary 150 (H) 65 - 99 mg/dL   Comment 1 Notify RN    Comment 2 Document in Chart   Urine Drug Screen, Qualitative     Status: Abnormal   Collection Time: 07/06/17 11:14 AM  Result Value Ref Range   Tricyclic, Ur Screen NONE DETECTED NONE DETECTED   Amphetamines, Ur Screen NONE DETECTED NONE DETECTED    MDMA (Ecstasy)Ur Screen NONE DETECTED NONE DETECTED   Cocaine Metabolite,Ur [place] NONE DETECTED NONE DETECTED   Opiate, Ur Screen NONE DETECTED NONE DETECTED   Phencyclidine (PCP) Ur S NONE DETECTED NONE DETECTED   Cannabinoid 50 Ng, Ur [place] NONE DETECTED NONE DETECTED   Barbiturates, Ur Screen (A) NONE DETECTED    Result not available. Reagent lot number recalled by manufacturer.   Benzodiazepine, Ur Scrn NONE DETECTED NONE DETECTED   Methadone Scn, Ur NONE DETECTED NONE DETECTED    Comment: (NOTE) Tricyclics + metabolites, urine    Cutoff 1000 ng/mL Amphetamines + metabolites, urine  Cutoff 1000 ng/mL MDMA (Ecstasy), urine              Cutoff 500 ng/mL Cocaine Metabolite, urine          Cutoff 300 ng/mL Opiate + metabolites, urine        Cutoff 300 ng/mL Phencyclidine (PCP), urine         Cutoff 25 ng/mL Cannabinoid, urine                 Cutoff 50 ng/mL Barbiturates + metabolites, urine  Cutoff 200 ng/mL Benzodiazepine, urine              Cutoff 200 ng/mL Methadone, urine                   Cutoff 300 ng/mL The urine drug screen provides only a preliminary, unconfirmed analytical test result and should not be used for non-medical purposes. Clinical consideration and professional judgment should be applied to any positive drug screen result due to possible interfering substances. A more  specific alternate chemical method must be used in order to obtain a confirmed analytical result. Gas chromatography / mass spectrometry (GC/MS) is the preferred confirmat ory method. Performed at [facility], [address] [place], [place] [address]   Urinalysis, Routine w reflex microscopic     Status: Abnormal   Collection Time: 07/06/17 11:14 AM  Result Value Ref Range   Color, Urine YELLOW (A) YELLOW   APPearance CLEAR (A) CLEAR   Specific Gravity, Urine 1.017 1.005 - 1.030   pH 6.0 5.0 - 8.0   Glucose, UA NEGATIVE NEGATIVE mg/dL   Hgb urine dipstick NEGATIVE NEGATIVE   Bilirubin  Urine NEGATIVE NEGATIVE   Ketones, ur NEGATIVE NEGATIVE mg/dL   Protein, ur NEGATIVE NEGATIVE mg/dL   Nitrite NEGATIVE NEGATIVE   Leukocytes, UA NEGATIVE NEGATIVE    Comment: Performed at [facility], [address], [place], [place] [address]  Magnesium     Status: None   Collection Time: 07/06/17 12:27 PM  Result Value Ref Range   Magnesium 1.7 1.7 - 2.4 mg/dL    Comment: Performed at [facility], [address] [place], [place] [address]  Glucose, capillary     Status: Abnormal   Collection Time: 07/06/17  1:53 PM  Result Value Ref Range   Glucose-Capillary 106 (H) 65 - 99 mg/dL  Type and screen [place]     Status: None   Collection Time: 07/06/17  2:32 PM  Result Value Ref Range   ABO/RH(D) O POS    Antibody Screen NEG    Sample Expiration 07/09/2017    Unit Number [id number]    Blood Component Type RED CELLS,LR    Unit division 00    Status of Unit ISSUED,FINAL    Transfusion Status OK TO TRANSFUSE    Crossmatch Result      Compatible Performed at [facility], [address] [place], [place] [address]   Prepare RBC     Status: None   Collection Time: 07/06/17  2:32 PM  Result Value Ref Range   Order Confirmation      ORDER PROCESSED BY BLOOD BANK Performed at [facility], [address] [place], [place] [address]   Phosphorus     Status: None   Collection Time: 07/06/17  2:32 PM  Result Value Ref Range   Phosphorus 3.8 2.5 - 4.6 mg/dL    Comment: Performed at [facility], [address] [place], [place] [address]  Glucose, capillary     Status: Abnormal   Collection Time: 07/06/17  4:36 PM  Result Value Ref Range   Glucose-Capillary 170 (H) 65 - 99 mg/dL  Potassium     Status: Abnormal   Collection Time: 07/06/17  9:03 PM  Result Value Ref Range   Potassium 3.3 (L) 3.5 - 5.1 mmol/L    Comment: Performed at [facility], [address] [place], [place] [address]   Basic metabolic panel     Status: Abnormal   Collection Time: 07/07/17  4:47 AM  Result Value Ref Range   Sodium 142 135 - 145 mmol/L   Potassium 4.4 3.5 - 5.1 mmol/L   Chloride 111 98 - 111 mmol/L   CO2 24 22 - 32 mmol/L   Glucose, Bld 106 (H) 70 - 99 mg/dL   BUN 12 8 - 23 mg/dL   Creatinine, Ser 0.47 0.44 - 1.00 mg/dL   Calcium 8.4 (L) 8.9 - 10.3 mg/dL   GFR calc  non Af Amer >60 >60 mL/min   GFR calc Af Amer >60 >60 mL/min    Comment: (NOTE) The eGFR has been calculated using the CKD EPI equation. This calculation has not been validated in all clinical situations. eGFR's persistently <60 mL/min signify possible Chronic Kidney Disease.    Anion gap 7 5 - 15    Comment: Performed at [facility], [address] [place], [place] [address]  Hemoglobin A1c     Status: Abnormal   Collection Time: 07/07/17  4:47 AM  Result Value Ref Range   Hgb A1c MFr Bld 6.2 (H) 4.8 - 5.6 %    Comment: (NOTE) Pre diabetes:          5.7%-6.4% Diabetes:              >6.4% Glycemic control for   <7.0% adults with diabetes    Mean Plasma Glucose 131.24 mg/dL    Comment: Performed at [facility] [address], [place], [place] [address]  Lipid panel     Status: Abnormal   Collection Time: 07/07/17  4:47 AM  Result Value Ref Range   Cholesterol 123 0 - 200 mg/dL   Triglycerides 164 (H) <150 mg/dL   HDL 34 (L) >40 mg/dL   Total CHOL/HDL Ratio 3.6 RATIO   VLDL 33 0 - 40 mg/dL   LDL Cholesterol 56 0 - 99 mg/dL    Comment:        Total Cholesterol/HDL:CHD Risk Coronary Heart Disease Risk Table                     Men   Women  1/2 Average Risk   3.4   3.3  Average Risk       5.0   4.4  2 X Average Risk   9.6   7.1  3 X Average Risk  23.4   11.0        Use the calculated Patient Ratio above and the CHD Risk Table to determine the patient's CHD Risk.        ATP III CLASSIFICATION (LDL):  <100     mg/dL   Optimal  100-129  mg/dL   Near or Above                     Optimal  130-159  mg/dL   Borderline  160-189  mg/dL   High  >190     mg/dL   Very High Performed at [facility], [address] [place], [place] [address]   Uric acid     Status: None   Collection Time: 07/07/17  4:47 AM  Result Value Ref Range   Uric Acid, Serum 3.7 2.5 - 7.1 mg/dL    Comment: Performed at [facility], [address] [place], [place] [address]  Ferritin     Status: None   Collection Time: 07/07/17  4:47 AM  Result Value Ref Range   Ferritin 17 11 - 307 ng/mL    Comment: Performed at [facility], [address] [place], [place] [address]  Reticulocytes     Status: Abnormal   Collection Time: 07/07/17  4:47 AM  Result Value Ref Range   Retic Ct Pct 3.3 (H) 0.4 - 3.1 %   RBC. 3.59 (L) 3.80 - 5.20 MIL/uL   Retic Count, Absolute 118.5 19.0 - 183.0 K/uL    Comment: Performed at [facility], [place],  [place] [address]  Sedimentation rate     Status: Abnormal   Collection Time: 07/07/17  4:47 AM  Result Value Ref Range   Sed Rate 35 (H) 0 - 30 mm/hr    Comment: Performed at [facility], [address] [place], [place] [address]  Glucose, capillary     Status: Abnormal   Collection Time: 07/07/17  7:37 AM  Result Value Ref Range   Glucose-Capillary 100 (H) 70 - 99 mg/dL  Glucose, capillary     Status: Abnormal   Collection Time: 07/07/17 11:42 AM  Result Value Ref Range   Glucose-Capillary 134 (H) 70 - 99 mg/dL  Glucose, capillary     Status: Abnormal   Collection Time: 07/07/17  4:45 PM  Result Value Ref Range   Glucose-Capillary 67 (L) 70 - 99 mg/dL  Glucose, capillary     Status: Abnormal   Collection Time: 07/07/17  4:59 PM  Result Value Ref Range   Glucose-Capillary 64 (L) 70 - 99 mg/dL  Glucose, capillary     Status: Abnormal   Collection Time: 07/07/17  5:19 PM  Result Value Ref Range   Glucose-Capillary 103 (H) 70 - 99 mg/dL   Ct Head Wo Contrast  Result Date:  07/06/2017 CLINICAL DATA:  Slight facial droop and weakness since last night at 9 p.m. EXAM: CT HEAD WITHOUT CONTRAST TECHNIQUE: Contiguous axial images were obtained from the base of the skull through the vertex without intravenous contrast. COMPARISON:  04/05/2017 FINDINGS: Brain: No evidence of acute infarction, hemorrhage, hydrocephalus, extra-axial collection or mass lesion/mass effect. Moderate remote left MCA territory infarct centered in the left parietal and posterior temporal lobes. Patchy low-density in the cerebral white matter from chronic small vessel ischemia. Small remote bilateral cerebellar infarcts. Vascular: No hyperdense vessel. Skull: No acute finding Sinuses/Orbits: Negative Other: These results were called by telephone at the time of interpretation on 07/06/2017 at 9:56 am to Dr. Conni Slipper , who verbally acknowledged these results. IMPRESSION: 1. No acute finding. 2. Chronic ischemic injury is described above. Electronically Signed   By: Monte Fantasia M.D.   On: 07/06/2017 09:58   [name]  Result Date: 07/06/2017 CLINICAL DATA:  Focal neuro deficit. History of stroke, atrial fibrillation. On Eliquis EXAM: MRI HEAD WITHOUT CONTRAST TECHNIQUE: Multiplanar, multiecho pulse sequences of the brain and surrounding structures were obtained without intravenous contrast. COMPARISON:  MRI head 04/05/2017 FINDINGS: Brain: Left temporoparietal infarct with volume loss and mild cortical hemorrhage is noted on the prior study. Moderate chronic microvascular ischemic change throughout the white matter. Small chronic infarcts in the cerebellum bilaterally. Negative for acute infarct. 1 cm calcified extra-axial mass over the right convexity compatible with a small meningioma unchanged from prior studies. No adjacent edema in the brain. Mild to moderate atrophy unchanged. Vascular: Normal arterial flow void Skull and upper cervical spine: Negative Sinuses/Orbits: Mild mucosal edema  paranasal sinuses. Normal orbit bilaterally Other: None IMPRESSION: Negative for acute infarct Atrophy with moderate to advanced chronic ischemic changes. 1 cm right convexity meningioma unchanged from prior studies. Electronically Signed   By: Franchot Gallo M.D.   On: 07/06/2017 12:56    Assessment:  Samantha Aguirre is a 72 y.o. female with recurrent iron deficiency anemia.  She presented with an acute drop in hemoglobin over the past 3 months.  She notes a 2 week history of weakness.  She has black stools (? secondary to iron).  Urinalysis reveals no blood.  She denies vaginal bleeding.  Diet is fair.  Upper endoscopy on 02/20/2017 was normal.  Colonoscopy on 02/20/2017 revealed ten 7-10 mm polyps in the rectum, descending colon, transverse colon and ascending colon.  There were 2 diminutive polyps in the cecum.  Patien has a history of B12 deficiency.  She is on supplementation.  B12 is normal.  Symptomatically, she remains fatigued.  She denies any bleeding.  Stools are black (? secondary to iron).  RBCs are again microcytic.    Plan:   1.  Hematology:  Iron deficiency anemia.  Hemoglobin dropped from 11.9 to 6.7 in the past 3 months.  RBCs are again microcytic c/w recurrent iron deficiency.  Ferritin was 2 in 02/2017 and 17 today.  Patient s/p 1 unit PRBCs yesterday.  Await f/u CBC.  Suspect recurrent GI bleeding.  Guaiac all stools.  GI consulted.  EGD planned for AM.  If negative, anticipate capsule study.  Eliquis currently on hold.    Rediscussed consideration of IV iron (Venofer).  Risks and benefits re-reviewed. Patient to receive Venofer 200 mg IV weekly (begin as inpatient).   Lequita Asal, MD  07/07/2017, 9:06 PM

## 2017-07-07 NOTE — Consult Note (Signed)
Samantha Lame, MD [facility]  [address], [place], [place] [address] Phone: [phone] Fax : [phone]  Consultation  Referring Provider:     Dr. Posey Pronto Primary Care Physician:  Tracie Harrier, MD Primary Gastroenterologist:  Dr. Marius Ditch         Reason for Consultation:     Anemia  Date of Admission:  07/06/2017 Date of Consultation:  07/07/2017         HPI:   Samantha Aguirre is a 72 y.o. female who has a history of having an EGD and colonoscopy by Dr. Marius Ditch back in March.  The patient had 10 polyps in the colon and a normal upper endoscopy.  The patient was found to have anemia this admission.  The patient had a hemoglobin of 11.9 in March and now it is 6.7.  She denies abdominal pain but does report some dark stools since she started taking iron.  There is no report of any nausea vomiting.  The patient states that she came to the hospital because she felt like she may be having another stroke.  The patient is on Eliquis.  The patient underwent an MRI which showed no acute infarct but atrophy with moderate to advanced chronic ischemic changes.  The patient was also noted to have B12 deficiency which has recently been normal.  She has a history of COPD with ongoing tobacco use and a history of coronary artery disease. The patient was seen by oncology and noted to have microcytic anemia with a low MCV.  The patient Eliquis has been held.  Past Medical History:  Diagnosis Date  . Acid reflux   . Arthritis    Bilateral knees, hands, feet  . Asthma   . Atrial fibrillation ([place])   . Cancer ([place])   . Chest pain   . CHF (congestive heart failure) (HCC)    Diastolic CHF  . COPD (chronic obstructive pulmonary disease) ([place])   . Coronary artery disease   . Diabetes mellitus without complication ([place])   . Frequent headaches   . Heart attack ([place])   . High cholesterol   . HLD (hyperlipidemia)   . Hypertension   . MI (myocardial infarction) ([place])   . Seizures ([place])   . Skin  cancer 2015   Suspected basal cell carcinoma on nose, surgically removed  . Stroke ([place])   . Thyroid disease   . Tremors of nervous system     Past Surgical History:  Procedure Laterality Date  . ABDOMINAL HYSTERECTOMY  1976  . APPENDECTOMY  1980  . CAROTID ENDARTERECTOMY    . CHOLECYSTECTOMY  1980  . COLONOSCOPY N/A 02/20/2017   Procedure: COLONOSCOPY;  Surgeon: Lin Landsman, MD;  Location: [facility] ENDOSCOPY;  Service: Gastroenterology;  Laterality: N/A;  . ESOPHAGOGASTRODUODENOSCOPY N/A 02/20/2017   Procedure: ESOPHAGOGASTRODUODENOSCOPY (EGD);  Surgeon: Lin Landsman, MD;  Location: [facility] ENDOSCOPY;  Service: Gastroenterology;  Laterality: N/A;  . [place]  . KNEE SURGERY     left  . ROTATOR CUFF REPAIR     right  . TONSILLECTOMY  1950    Prior to Admission medications   Medication Sig Start Date End Date Taking? Authorizing Provider  albuterol (PROVENTIL HFA;VENTOLIN HFA) 108 (90 Base) MCG/ACT inhaler Inhale 2 puffs into the lungs every 6 (six) hours as needed for wheezing or shortness of breath. 10/08/16  Yes Karamalegos, Devonne Doughty, DO  apixaban (ELIQUIS) 5 MG TABS tablet Take 5 mg by mouth every 12 (twelve) hours.  Yes [provider]  atorvastatin (LIPITOR) 80 MG tablet Take 1 tablet (80 mg total) by mouth daily at 6 PM. 09/24/16  Yes Karamalegos, Devonne Doughty, DO  diclofenac sodium (VOLTAREN) 1 % GEL Apply 2 g topically 4 (four) times daily. 01/11/16  Yes Daymon Larsen, MD  DULoxetine (CYMBALTA) 60 MG capsule Take 1 capsule (60 mg total) by mouth at bedtime. 09/24/16  Yes Karamalegos, Devonne Doughty, DO  ferrous sulfate 325 (65 FE) MG EC tablet Take 1 tablet (325 mg total) by mouth 2 (two) times daily with a meal. 02/21/17  Yes Sudini, Srikar, MD  fluticasone (FLONASE) 50 MCG/ACT nasal spray Place 1 spray into both nostrils daily. 10/08/16  Yes Karamalegos, Devonne Doughty, DO  furosemide (LASIX) 20 MG tablet Take 10 mg by mouth daily.    Yes  Karamalegos, Devonne Doughty, DO  gabapentin (NEURONTIN) 800 MG tablet Take 1 tablet (800 mg total) by mouth 2 (two) times daily. 09/24/16  Yes Karamalegos, Devonne Doughty, DO  glipiZIDE-metformin (METAGLIP) 5-500 MG tablet Take 1 tablet by mouth 2 (two) times daily before a meal. 04/02/17  Yes Karamalegos, Devonne Doughty, DO  levETIRAcetam (KEPPRA) 500 MG tablet Take 1 tablet (500 mg total) by mouth 2 (two) times daily. 09/19/15  Yes Epifanio Lesches, MD  levothyroxine (SYNTHROID, LEVOTHROID) 175 MCG tablet Take 1 tablet (175 mcg total) by mouth daily before breakfast. Patient taking differently: Take 112 mcg by mouth daily before breakfast.  09/24/16  Yes Karamalegos, Devonne Doughty, DO  lisinopril (PRINIVIL,ZESTRIL) 20 MG tablet Take 1 tablet (20 mg total) by mouth daily. 10/08/16  Yes Karamalegos, Devonne Doughty, DO  nitroGLYCERIN (NITROSTAT) 0.4 MG SL tablet Place 1 tablet (0.4 mg total) under the tongue every 5 (five) minutes as needed for chest pain. 02/28/16  Yes Mody, Ulice Bold, MD  omeprazole (PRILOSEC) 20 MG capsule Take 20 mg by mouth 2 (two) times daily.   Yes [provider]  potassium chloride (MICRO-K) 10 MEQ CR capsule Take 1 capsule (10 mEq total) by mouth daily. Patient taking differently: Take 10 mEq by mouth 2 (two) times daily.  09/24/16  Yes Karamalegos, Devonne Doughty, DO  sucralfate (CARAFATE) 1 g tablet TAKE (1) TABLET BY MOUTH FOUR TIMES A DAY 03/26/17  Yes Karamalegos, Devonne Doughty, DO  umeclidinium bromide (INCRUSE ELLIPTA) 62.5 MCG/INH AEPB Inhale 1 puff into the lungs daily. 11/13/15  Yes Karamalegos, Devonne Doughty, DO  vitamin B-12 (CYANOCOBALAMIN) 1000 MCG tablet Take 1 tablet by mouth daily. 06/29/17  Yes [provider]    Family History  Problem Relation Age of Onset  . Breast cancer Mother   . Heart disease Mother   . Stroke Mother   . Cancer Mother   . COPD Mother   . Diabetes Mother   . Heart disease Father   . Diabetes Father   . Stroke Father   . Alcohol abuse  Sister   . Drug abuse Sister   . Stroke Sister   . Cancer Sister   . Mental illness Sister   . Heart disease Brother   . Arthritis Brother   . Diabetes Brother   . Heart disease Maternal Grandfather   . Heart disease Paternal Grandfather      Social History   Tobacco Use  . Smoking status: Current Every Day Smoker    Packs/day: 1.00    Years: 50.00    Pack years: 50.00    Types: Cigarettes  . Smokeless tobacco: Never Used  Substance Use Topics  .  Alcohol use: No  . Drug use: No    Allergies as of 07/06/2017 - Review Complete 07/06/2017  Allergen Reaction Noted  . Mushroom extract complex Anaphylaxis 08/16/2015  . Atenolol  08/16/2015  . Ivp dye [iodinated diagnostic agents] Itching 09/16/2015  . Morphine and related Other (See Comments) 08/16/2015  . Percocet [oxycodone-acetaminophen] Nausea And Vomiting and Other (See Comments) 08/16/2015  . Percodan [oxycodone-aspirin] Nausea And Vomiting and Other (See Comments) 08/16/2015  . Verapamil Other (See Comments) 08/16/2015    Review of Systems:    All systems reviewed and negative except where noted in HPI.   Physical Exam:  Vital signs in last 24 hours: Temp:  [97.5 F (36.4 C)-98.2 F (36.8 C)] 97.8 F (36.6 C) (06/25 0842) Pulse Rate:  [58-68] 64 (06/25 0842) Resp:  [16-18] 18 (06/25 0842) BP: (106-146)/(55-69) 135/59 (06/25 0842) SpO2:  [95 %-99 %] 99 % (06/25 0842) Last BM Date: 07/07/17 General:   Pleasant, cooperative in NAD Head:  Normocephalic and atraumatic. Eyes:   No icterus.   Conjunctiva pink. PERRLA. Ears:  Normal auditory acuity. Neck:  Supple; no masses or thyroidomegaly Lungs: Respirations even and unlabored. Lungs clear to auscultation bilaterally.   No wheezes, crackles, or rhonchi.  Heart:  Regular rate and rhythm;  Without murmur, clicks, rubs or gallops Abdomen:  Soft, nondistended, nontender. Normal bowel sounds. No appreciable masses or hepatomegaly.  No rebound or guarding.  Rectal:   Not performed. Msk:  Symmetrical without gross deformities.    Extremities:  Without edema, cyanosis or clubbing. Neurologic:  Alert and oriented x3;  grossly normal neurologically. Skin:  Intact without significant lesions or rashes. Cervical Nodes:  No significant cervical adenopathy. Psych:  Alert and cooperative. Normal affect.  LAB RESULTS: Recent Labs    07/06/17 0936  WBC 5.9  HGB 6.7*  HCT 21.8*  PLT 314   BMET Recent Labs    07/06/17 0936 07/06/17 2103 07/07/17 0447  NA 140  --  142  K 2.7* 3.3* 4.4  CL 105  --  111  CO2 23  --  24  GLUCOSE 156*  --  106*  BUN 11  --  12  CREATININE 0.42*  --  0.47  CALCIUM 8.1*  --  8.4*   LFT Recent Labs    07/06/17 0936  PROT 5.9*  ALBUMIN 3.2*  AST 20  ALT 10*  ALKPHOS 88  BILITOT 0.5   PT/INR Recent Labs    07/06/17 0936  LABPROT 14.7  INR 1.16    STUDIES: Ct Head Wo Contrast  Result Date: 07/06/2017 CLINICAL DATA:  Slight facial droop and weakness since last night at 9 p.m. EXAM: CT HEAD WITHOUT CONTRAST TECHNIQUE: Contiguous axial images were obtained from the base of the skull through the vertex without intravenous contrast. COMPARISON:  04/05/2017 FINDINGS: Brain: No evidence of acute infarction, hemorrhage, hydrocephalus, extra-axial collection or mass lesion/mass effect. Moderate remote left MCA territory infarct centered in the left parietal and posterior temporal lobes. Patchy low-density in the cerebral white matter from chronic small vessel ischemia. Small remote bilateral cerebellar infarcts. Vascular: No hyperdense vessel. Skull: No acute finding Sinuses/Orbits: Negative Other: These results were called by telephone at the time of interpretation on 07/06/2017 at 9:56 am to Dr. Conni Slipper , who verbally acknowledged these results. IMPRESSION: 1. No acute finding. 2. Chronic ischemic injury is described above. Electronically Signed   By: Monte Fantasia M.D.   On: 07/06/2017 09:58   Mr Brain Wo  Contrast  Result Date: 07/06/2017 CLINICAL DATA:  Focal neuro deficit. History of stroke, atrial fibrillation. On Eliquis EXAM: MRI HEAD WITHOUT CONTRAST TECHNIQUE: Multiplanar, multiecho pulse sequences of the brain and surrounding structures were obtained without intravenous contrast. COMPARISON:  MRI head 04/05/2017 FINDINGS: Brain: Left temporoparietal infarct with volume loss and mild cortical hemorrhage is noted on the prior study. Moderate chronic microvascular ischemic change throughout the white matter. Small chronic infarcts in the cerebellum bilaterally. Negative for acute infarct. 1 cm calcified extra-axial mass over the right convexity compatible with a small meningioma unchanged from prior studies. No adjacent edema in the brain. Mild to moderate atrophy unchanged. Vascular: Normal arterial flow void Skull and upper cervical spine: Negative Sinuses/Orbits: Mild mucosal edema paranasal sinuses. Normal orbit bilaterally Other: None IMPRESSION: Negative for acute infarct Atrophy with moderate to advanced chronic ischemic changes. 1 cm right convexity meningioma unchanged from prior studies. Electronically Signed   By: Franchot Gallo M.D.   On: 07/06/2017 12:56      Impression / Plan:   Assessment: Active Problems:   CVA (cerebral vascular accident) ([place])   Kileen Lange is a 72 y.o. y/o female with a history of a CVA in the past with a drop in her hemoglobin over the last few months.  The patient had a hemoglobin of 11.9 that has gone down to 6.7.  The patient also has low iron studies.  The patient had an EGD and colonoscopy in the past with multiple polyps seen on the colon but normal upper endoscopy.  Plan:  The patient will be set up for an EGD for tomorrow to make sure that she has not developed a gastric lesion such as an ulcer or gastritis.  If this is negative then the patient will need to be set up for a capsule endoscopy to look for a small bowel bleed.  The patient has been  explained the plan and agrees with it.   Thank you for involving me in the care of this patient.      LOS: 1 day   Samantha Lame, MD  07/07/2017, 1:38 PM    Note: This dictation was prepared with Dragon dictation along with smaller phrase technology. Any transcriptional errors that result from this process are unintentional.

## 2017-07-08 ENCOUNTER — Inpatient Hospital Stay: Payer: Medicare Other | Admitting: Anesthesiology

## 2017-07-08 ENCOUNTER — Encounter: Payer: Self-pay | Admitting: Anesthesiology

## 2017-07-08 ENCOUNTER — Encounter
Admission: EM | Disposition: A | Discharge status: Other Acute Inpatient Hospital | Payer: Self-pay | Source: Home / Self Care | Attending: Internal Medicine

## 2017-07-08 DIAGNOSIS — I890 Lymphedema, not elsewhere classified: Secondary | ICD-10-CM

## 2017-07-08 DIAGNOSIS — K922 Gastrointestinal hemorrhage, unspecified: Secondary | ICD-10-CM

## 2017-07-08 DIAGNOSIS — D649 Anemia, unspecified: Secondary | ICD-10-CM

## 2017-07-08 DIAGNOSIS — K31811 Angiodysplasia of stomach and duodenum with bleeding: Principal | ICD-10-CM

## 2017-07-08 HISTORY — PX: ESOPHAGOGASTRODUODENOSCOPY (EGD) WITH PROPOFOL: SHX5813

## 2017-07-08 LAB — BASIC METABOLIC PANEL
Anion gap: 6 (ref 5–15)
BUN: 9 mg/dL (ref 8–23)
CALCIUM: 8.7 mg/dL — AB (ref 8.9–10.3)
CO2: 25 mmol/L (ref 22–32)
CREATININE: 0.49 mg/dL (ref 0.44–1.00)
Chloride: 109 mmol/L (ref 98–111)
GFR calc Af Amer: >60 mL/min (ref >60)
Glucose, Bld: 141 mg/dL — ABNORMAL HIGH (ref 70–99)
Potassium: 4.7 mmol/L (ref 3.5–5.1)
SODIUM: 140 mmol/L (ref 135–145)

## 2017-07-08 LAB — CBC
HCT: 28.8 % — ABNORMAL LOW (ref 35.0–47.0)
Hemoglobin: 9 g/dL — ABNORMAL LOW (ref 12.0–16.0)
MCH: 23.2 pg — ABNORMAL LOW (ref 26.0–34.0)
MCHC: 31.2 g/dL — ABNORMAL LOW (ref 32.0–36.0)
MCV: 74.2 fL — ABNORMAL LOW (ref 80.0–100.0)
Platelets: 310 10*3/uL (ref 150–440)
RBC: 3.88 MIL/uL (ref 3.80–5.20)
RDW: 24.4 % — ABNORMAL HIGH (ref 11.5–14.5)
WBC: 5.3 10*3/uL (ref 3.6–11.0)

## 2017-07-08 LAB — GLUCOSE, CAPILLARY
GLUCOSE-CAPILLARY: 113 mg/dL — AB (ref 70–99)
GLUCOSE-CAPILLARY: 104 mg/dL — AB (ref 70–99)
GLUCOSE-CAPILLARY: 110 mg/dL — AB (ref 70–99)
Glucose-Capillary: 135 mg/dL — ABNORMAL HIGH (ref 70–99)
Glucose-Capillary: 106 mg/dL — ABNORMAL HIGH (ref 70–99)

## 2017-07-08 LAB — MAGNESIUM: MAGNESIUM: 2.2 mg/dL (ref 1.7–2.4)

## 2017-07-08 SURGERY — ESOPHAGOGASTRODUODENOSCOPY (EGD) WITH PROPOFOL
Anesthesia: General

## 2017-07-08 MED ORDER — POTASSIUM CHLORIDE CRYS ER 20 MEQ PO TBCR
20.0000 meq | EXTENDED_RELEASE_TABLET | Freq: Two times a day (BID) | ORAL | Status: DC
  Administered 2017-07-08: 22:00:00 20 meq via ORAL
  Filled 2017-07-08: qty 1

## 2017-07-08 MED ORDER — KETOROLAC TROMETHAMINE 15 MG/ML IJ SOLN
15.0000 mg | Freq: Once | INTRAMUSCULAR | Status: AC
  Administered 2017-07-08: 12:00:00 15 mg via INTRAVENOUS
  Filled 2017-07-08: qty 1

## 2017-07-08 MED ORDER — LIDOCAINE HCL (CARDIAC) PF 100 MG/5ML IV SOSY
PREFILLED_SYRINGE | INTRAVENOUS | Status: DC | PRN
  Administered 2017-07-08: 60 mg via INTRAVENOUS

## 2017-07-08 MED ORDER — SODIUM CHLORIDE 0.9 % IV SOLN
INTRAVENOUS | Status: DC
  Administered 2017-07-08: 1000 mL via INTRAVENOUS

## 2017-07-08 MED ORDER — PROPOFOL 500 MG/50ML IV EMUL
INTRAVENOUS | Status: DC | PRN
  Administered 2017-07-08: 140 ug/kg/min via INTRAVENOUS

## 2017-07-08 MED ORDER — PROPOFOL 10 MG/ML IV BOLUS
INTRAVENOUS | Status: DC | PRN
  Administered 2017-07-08: 50 mg via INTRAVENOUS
  Administered 2017-07-08 (×3): 10 mg via INTRAVENOUS

## 2017-07-08 MED ORDER — MORPHINE SULFATE (PF) 2 MG/ML IV SOLN
2.0000 mg | INTRAVENOUS | Status: DC | PRN
  Filled 2017-07-08: qty 1

## 2017-07-08 MED ORDER — LIDOCAINE HCL (PF) 2 % IJ SOLN
INTRAMUSCULAR | Status: AC
  Filled 2017-07-08: qty 10

## 2017-07-08 MED ORDER — PROPOFOL 500 MG/50ML IV EMUL
INTRAVENOUS | Status: AC
  Filled 2017-07-08: qty 50

## 2017-07-08 NOTE — Progress Notes (Signed)
Inpatient Diabetes Program Recommendations  AACE/ADA: New Consensus Statement on Inpatient Glycemic Control (2019)  Target Ranges:  Prepandial:   less than 140 mg/dL      Peak postprandial:   less than 180 mg/dL (1-2 hours)      Critically ill patients:  140 - 180 mg/dL   Results for Samantha Aguirre, Samantha Aguirre (MRN [id number]) as of 07/08/2017 11:05  Ref. Range 07/07/2017 07:37 07/07/2017 11:42 07/07/2017 16:45 07/07/2017 16:59 07/07/2017 17:19 07/07/2017 21:17 07/08/2017 07:38  Glucose-Capillary Latest Ref Range: 70 - 99 mg/dL 100 (H) 134 (H) 67 (L) 64 (L) 103 (H) 136 (H) 135 (H)   Review of Glycemic Control  Diabetes history: DM2 Outpatient Diabetes medications: Metaglip 5-500 mg BID Current orders for Inpatient glycemic control: Metformin 500 mg BID, Glipizide 5 mg BID, Novolog 0-9 units TID with meals  Inpatient Diabetes Program Recommendations:  Oral Agents: Noted patient did not receive evening dose of Glipizide nor Metformin on 6/25 (only received morning dose of each). Glucose down to 64 mg/dl at 16:59 on 07/07/17. While inpatient, may want to consider decreasing Metformin to 500 mg daily  and Glipizide 5 mg daily.  Thanks, Barnie Alderman, RN, MSN, CDE Diabetes Coordinator Inpatient Diabetes Program [phone] (Team Pager from 8am to 5pm)

## 2017-07-08 NOTE — Anesthesia Preprocedure Evaluation (Signed)
Anesthesia Evaluation  Patient identified by MRN, date of birth, ID band Patient awake    Reviewed: Allergy & Precautions, NPO status , Patient's Chart, lab work & pertinent test results, reviewed documented beta blocker date and time   History of Anesthesia Complications Negative for: history of anesthetic complications  Airway Mallampati: III  TM Distance: >3 FB     Dental  (+) Chipped, Partial Upper, Edentulous Lower   Pulmonary shortness of breath and with exertion, asthma , COPD,  COPD inhaler, neg recent URI, Current Smoker,           Cardiovascular hypertension, Pt. on medications (-) angina+ CAD, + Past MI, + Cardiac Stents, + Peripheral Vascular Disease and +CHF  (-) CABG + dysrhythmias Atrial Fibrillation + Valvular Problems/Murmurs      Neuro/Psych  Headaches, Seizures -, Well Controlled,  PSYCHIATRIC DISORDERS Depression CVA, Residual Symptoms    GI/Hepatic Neg liver ROS, GERD  ,  Endo/Other  diabetes, Type 2Hypothyroidism   Renal/GU negative Renal ROS     Musculoskeletal  (+) Arthritis ,   Abdominal   Peds  Hematology  (+) Blood dyscrasia, anemia ,   Anesthesia Other Findings Past Medical History: No date: Acid reflux No date: Arthritis     Comment:  Bilateral knees, hands, feet No date: Asthma No date: Atrial fibrillation (HCC) No date: Cancer (HCC) No date: Chest pain No date: CHF (congestive heart failure) (HCC)     Comment:  Diastolic CHF No date: COPD (chronic obstructive pulmonary disease) (HCC) No date: Coronary artery disease No date: Diabetes mellitus without complication (HCC) No date: Frequent headaches No date: Heart attack ([place]) No date: High cholesterol No date: HLD (hyperlipidemia) No date: Hypertension No date: MI (myocardial infarction) ([place]) No date: Seizures ([place]) 2015: Skin cancer     Comment:  Suspected basal cell carcinoma on nose, surgically               removed No  date: Stroke ([place]) No date: Thyroid disease No date: Tremors of nervous system   Reproductive/Obstetrics negative OB ROS                             Anesthesia Physical  Anesthesia Plan  ASA: III  Anesthesia Plan: General   Post-op Pain Management:    Induction: Intravenous  PONV Risk Score and Plan: 2 and Propofol infusion  Airway Management Planned: Nasal Cannula  Additional Equipment:   Intra-op Plan:   Post-operative Plan:   Informed Consent: I have reviewed the patients History and Physical, chart, labs and discussed the procedure including the risks, benefits and alternatives for the proposed anesthesia with the patient or authorized representative who has indicated his/her understanding and acceptance.     Plan Discussed with: CRNA  Anesthesia Plan Comments:         Anesthesia Quick Evaluation

## 2017-07-08 NOTE — Care Management Note (Addendum)
Case Management Note  Patient Details  Name: Samantha Aguirre MRN: [id number] Date of Birth: Dec 22, 1945  Subjective/Objective: Admitted to [facility] with the diagnosis of CVA. Lives alone. Seen Dr. Ginette Pitman about a week ago. Prescriptions are mail order. [facility] for [facility] in the past. Skilled nursing last May. Doesn't remember name of facility. No home oxygen. Rolling walker and cane in the home. Takes care of all basic activities of daily living herself, doesn't drive. Family and friends help with errands. [facility] 2 times in the last 6 months. Fair-good appetite. Friend will transport EGD scheduled for today.                    Action/Plan: Physical and occupational therapy evaluations completed. Recommending services in the home. Chose [facility]  Glennis Brink updated.  Possible discharge tomorrow per Dr. Anselm Jungling   Expected Discharge Date:                  Expected Discharge Plan:     In-House Referral:   yes  Discharge planning Services   yes  Post Acute Care Choice:   yes Choice offered to:   patient  DME Arranged:    DME Agency:     HH Arranged:   yes [place] Agency:   [facility]  Status of Service:     If discussed at [place] of Stay Meetings, dates discussed:    Additional Comments:  Shelbie Ammons, RN MSN CCM Care Management [phone] 07/08/2017, 11:13 AM

## 2017-07-08 NOTE — Transfer of Care (Signed)
Immediate Anesthesia Transfer of Care Note  Patient: Samantha Aguirre  Procedure(s) Performed: ESOPHAGOGASTRODUODENOSCOPY (EGD) WITH PROPOFOL (N/A )  Patient Location: PACU  Anesthesia Type:General  Level of Consciousness: sedated  Airway & Oxygen Therapy: Patient Spontanous Breathing and Patient connected to nasal cannula oxygen  Post-op Assessment: Report given to RN and Post -op Vital signs reviewed and stable  Post vital signs: Reviewed and stable  Last Vitals:  Vitals Value Taken Time  BP 135/93 07/08/2017  2:22 PM  Temp 36.2 C 07/08/2017  2:22 PM  Pulse 62 07/08/2017  2:24 PM  Resp 17 07/08/2017  2:24 PM  SpO2 100 % 07/08/2017  2:24 PM  Vitals shown include unvalidated device data.  Last Pain:  Vitals:   07/08/17 1422  TempSrc: Tympanic  PainSc: 0-No pain      Patients Stated Pain Goal: 2 (83/16/74 2552)  Complications: No apparent anesthesia complications

## 2017-07-08 NOTE — Anesthesia Post-op Follow-up Note (Signed)
Anesthesia QCDR form completed.        

## 2017-07-08 NOTE — Progress Notes (Signed)
[place] [facility] [place] NAME: Samantha Aguirre    MR#:  [id number]  DATE OF BIRTH:  1945/07/22  SUBJECTIVE:  CHIEF COMPLAINT:   Chief Complaint  Patient presents with  . Facial Droop  . Weakness    Pt with recent stroke and GI work ups, came with left sided weakness, resolved now. Have Significant drop in Hb in last 3 months, s/p 1 unit PRBC.  REVIEW OF SYSTEMS:  CONSTITUTIONAL: No fever, fatigue or weakness.  EYES: No blurred or double vision.  EARS, NOSE, AND THROAT: No tinnitus or ear pain.  RESPIRATORY: No cough, shortness of breath, wheezing or hemoptysis.  CARDIOVASCULAR: No chest pain, orthopnea, edema.  GASTROINTESTINAL: No nausea, vomiting, diarrhea or abdominal pain.  GENITOURINARY: No dysuria, hematuria.  ENDOCRINE: No polyuria, nocturia,  HEMATOLOGY: No anemia, easy bruising or bleeding SKIN: No rash or lesion. MUSCULOSKELETAL: No joint pain or arthritis.   NEUROLOGIC: No tingling, numbness, weakness.  PSYCHIATRY: No anxiety or depression.   ROS  DRUG ALLERGIES:   Allergies  Allergen Reactions  . Mushroom Extract Complex Anaphylaxis    Made patient "deathly sick"  . Atenolol     "MD took me off of it bc it was doing something wrong" Made patient HYPOTENSIVE  . Ivp Dye [Iodinated Diagnostic Agents] Itching    Pt injected with IV contrast.  5 min after injection pt complained of itching behind ear and on her abd.  . Morphine And Related Other (See Comments)    "stops my breathing" NEAR-RESPIRATORY ARREST  . Percocet [Oxycodone-Acetaminophen] Nausea And Vomiting and Other (See Comments)    Stomach pains  . Percodan [Oxycodone-Aspirin] Nausea And Vomiting and Other (See Comments)    Stomach pains  . Verapamil Other (See Comments)    " MD told me this was screwing me up" MAKES PATIENT HYPOTENSIVE    VITALS:  Blood pressure 115/64, pulse (!) 55, temperature 97.9 F (36.6 C), temperature source Oral, resp. rate 18,  height 5\' 1"  (1.549 m), weight 82.4 kg (181 lb 11.2 oz), SpO2 98 %.  PHYSICAL EXAMINATION:  GENERAL:  72 y.o.-year-old patient lying in the bed with no acute distress.  EYES: Pupils equal, round, reactive to light and accommodation. No scleral icterus. Extraocular muscles intact.  HEENT: Head atraumatic, normocephalic. Oropharynx and nasopharynx clear.  NECK:  Supple, no jugular venous distention. No thyroid enlargement, no tenderness.  LUNGS: Normal breath sounds bilaterally, no wheezing, rales,rhonchi or crepitation. No use of accessory muscles of respiration.  CARDIOVASCULAR: S1, S2 normal. No murmurs, rubs, or gallops.  ABDOMEN: Soft, nontender, nondistended. Bowel sounds present. No organomegaly or mass.  EXTREMITIES: No pedal edema, cyanosis, or clubbing.  NEUROLOGIC: Cranial nerves II through XII are intact. Muscle strength 4/5 in all extremities. Sensation intact. Gait not checked.  PSYCHIATRIC: The patient is alert and oriented x 3.  SKIN: No obvious rash, lesion, or ulcer.   Physical Exam LABORATORY PANEL:   CBC Recent Labs  Lab 07/06/17 0936  WBC 5.9  HGB 6.7*  HCT 21.8*  PLT 314   ------------------------------------------------------------------------------------------------------------------  Chemistries  Recent Labs  Lab 07/06/17 0936 07/06/17 1227  07/07/17 0447  NA 140  --   --  142  K 2.7*  --    < > 4.4  CL 105  --   --  111  CO2 23  --   --  24  GLUCOSE 156*  --   --  106*  BUN 11  --   --  12  CREATININE 0.42*  --   --  0.47  CALCIUM 8.1*  --   --  8.4*  MG  --  1.7  --   --   AST 20  --   --   --   ALT 10*  --   --   --   ALKPHOS 88  --   --   --   BILITOT 0.5  --   --   --    < > = values in this interval not displayed.   ------------------------------------------------------------------------------------------------------------------  Cardiac Enzymes Recent Labs  Lab 07/06/17 0936  TROPONINI <0.03    ------------------------------------------------------------------------------------------------------------------  RADIOLOGY:  Ct Head Wo Contrast  Result Date: 07/06/2017 CLINICAL DATA:  Slight facial droop and weakness since last night at 9 p.m. EXAM: CT HEAD WITHOUT CONTRAST TECHNIQUE: Contiguous axial images were obtained from the base of the skull through the vertex without intravenous contrast. COMPARISON:  04/05/2017 FINDINGS: Brain: No evidence of acute infarction, hemorrhage, hydrocephalus, extra-axial collection or mass lesion/mass effect. Moderate remote left MCA territory infarct centered in the left parietal and posterior temporal lobes. Patchy low-density in the cerebral white matter from chronic small vessel ischemia. Small remote bilateral cerebellar infarcts. Vascular: No hyperdense vessel. Skull: No acute finding Sinuses/Orbits: Negative Other: These results were called by telephone at the time of interpretation on 07/06/2017 at 9:56 am to Dr. Conni Slipper , who verbally acknowledged these results. IMPRESSION: 1. No acute finding. 2. Chronic ischemic injury is described above. Electronically Signed   By: Monte Fantasia M.D.   On: 07/06/2017 09:58   [name]  Result Date: 07/06/2017 CLINICAL DATA:  Focal neuro deficit. History of stroke, atrial fibrillation. On Eliquis EXAM: MRI HEAD WITHOUT CONTRAST TECHNIQUE: Multiplanar, multiecho pulse sequences of the brain and surrounding structures were obtained without intravenous contrast. COMPARISON:  MRI head 04/05/2017 FINDINGS: Brain: Left temporoparietal infarct with volume loss and mild cortical hemorrhage is noted on the prior study. Moderate chronic microvascular ischemic change throughout the white matter. Small chronic infarcts in the cerebellum bilaterally. Negative for acute infarct. 1 cm calcified extra-axial mass over the right convexity compatible with a small meningioma unchanged from prior studies. No adjacent  edema in the brain. Mild to moderate atrophy unchanged. Vascular: Normal arterial flow void Skull and upper cervical spine: Negative Sinuses/Orbits: Mild mucosal edema paranasal sinuses. Normal orbit bilaterally Other: None IMPRESSION: Negative for acute infarct Atrophy with moderate to advanced chronic ischemic changes. 1 cm right convexity meningioma unchanged from prior studies. Electronically Signed   By: Franchot Gallo M.D.   On: 07/06/2017 12:56    ASSESSMENT AND PLAN:   Principal Problem:   GI bleed Active Problems:   Anemia   Samantha Aguirre  is a 72 y.o. female with a known history of recurrent CVA, atrial fibrillation on eliquis, severe iron deficiency anemia with local G.I. bleeding as for workup with colonoscopy and endoscopy in February 2019 on iron, vitamin B12 deficiency, COPD with ongoing tobacco abuse, coronary artery disease comes to the emergency room with complaints of left upper and lower extremity weakness since last night. Patient normally uses a walker and a cane  1. left upper and lower extremity weakness with left facial drool r/o  Acute CVA. -has had history of stroke in the past most recent was right colonic stroke with similar symptoms of left upper and lower extremity weakness in March 2019 -continue aspirin -holding eliquis due to anemia. -Neurology consultation appreciated -MRI of the brain  negative. -CT had negative -had echo, ultrasound carotid Doppler without any significant stenosis in March 2019. I will not repeat it. -PT, OT, speech therapy  2. severe microcytic anemia- likely GI bleed. -patient's hemoglobin has drifted from 11.0 in March 2019----7.7 (out pt June 2019)--6.7 on admission -is on eliquis for chronic a fib--- holding it -patient also has vitamin B12 deficiency - s/p one unit of blood transfusion -sHe had colonoscopy in February 2019 10 polyps anywhere from 7 to 10 mm were removed. G.I. felt that was the source of the bleeding given her  being on eliquis. She also had EGD which was essentially negative in February 2019 - hematology consultation for IV iron therapy -G.I. Consultation requested. --send stools for occult blood  3.CO PD with ongoing tobacco abuse -sats remain stable -patient not motivated to quit smoking  4. Hypertension -resume home meds from tomorrow  5. Hyperlipidemia on statins  6. Chronic atrial fibrillation -rate controlled. -Holding eliquis..... Need to discuss with cardiology if this can be discontinued since patient is likely having a local G.I. Bleeding -her cardiologist is Dr. Clayborn Bigness  7. Dm-2 SSI and cont home meds    All the records are reviewed and case discussed with Care Management/Social Workerr. Management plans discussed with the patient, family and they are in agreement.  CODE STATUS: DNR  TOTAL TIME TAKING CARE OF THIS PATIENT: 35 minutes.     POSSIBLE D/C IN *1-2 DAYS, DEPENDING ON CLINICAL CONDITION.   Vaughan Basta M.D on 07/08/2017   Between 7am to 6pm - Pager - [phone]  After 6pm go to [email or web address] - password [email or web address] [facility]  Office  [phone]  CC: Primary care physician; Tracie Harrier, MD  Note: This dictation was prepared with Dragon dictation along with smaller phrase technology. Any transcriptional errors that result from this process are unintentional.

## 2017-07-08 NOTE — Progress Notes (Addendum)
  Speech Language Pathology Treatment: Dysphagia(pt is on a Clear Liquid diet per GI currently)  Patient Details Name: Samantha Aguirre MRN: [id number] DOB: 12/16/1945 Today's Date: 07/08/2017 Time: 5597-4163 SLP Time Calculation (min) (ACUTE ONLY): 33 min  Assessment / Plan / Recommendation Clinical Impression  Pt seen for ongoing assessment of toleration of oral diet(Clear liquids at this time per GI d/t Esophageal issues and EGD today). Pt is awake, A/O x3; verbally conversive w/ SLP describing her medical issues. Noted tremors/shakes in UEs during self feeding task.  Pt consumed trials of thin liquids via cup/straw and fed self a jello(orange) - ~4 ozs of each. No overt s/s of aspiration noted, clear vocal quality b/t trials and no respiratory decline during/post trials. Pt fed self w/ setup assistance. Education was given on general aspiration precautions, Reflux precautions including lessening the carbonation in drinks b/f drinking them to lessen Esophageal irritation. Pt will continue w/ current clear liquid diet per GI at this time while under GI management. Recommend Pills given in puree if needed for easier clearing/swallowing through the Esophagus for the pt.  No further skilled ST services indicated at this time. NSG to reconsult if any decline noted in status while admitted. Pt agreed verbally. IF concern for Cognitive-linguistic decline(noted MRI results w/ mod-advanced changes/atrophy), then recommend f/u w/ formal assessment of engagement in ADLs in her next structured setting. NSG updated, agreed.    HPI HPI: Pt is a 72 y.o. female with a known history of recurrent CVA(she had an acute right thalamic infarct in March 2019. She was continued on eliquis then.), continues to Smoke Tobacco Heavily per chart, atrial fibrillation on eliquis, severe iron deficiency anemia with local G.I. bleeding as for workup with colonoscopy and endoscopy in February 2019 on iron, vitamin B12 deficiency, COPD  with ongoing tobacco abuse, coronary artery disease w/ lower EF who comes to the emergency room with complaints of left upper and lower extremity weakness since last night. Patient normally uses a walker and a cane at home per report. Per Neurology note yesterday, pt is Alert, Oriented, thought content appropriate; Speech fluent without evidence of aphasia; able to follow 3 step Commands without difficulty. Current MRI is Negative for acute infarct; Atrophy with Moderate to Advanced chronic ischemic changes. At initial SLP eval, speech intelligibility was noted to be clear and appropriate; no evidence of dysarthria or other deficits. Recommendation was to continue w/ her current Heart healthy diet with thin liquids w/ no further concerns although pt is being managed by GI currently.      SLP Plan  All goals met(education given) for swallowing       Recommendations  Diet recommendations: Thin liquid(clear liquid diet per GI currently) Liquids provided via: Cup;Straw Medication Administration: Whole meds with liquid(consider Puree if needed for easier passing/swallowing) Supervision: Patient able to self feed Compensations: Minimize environmental distractions;Slow rate;Small sips/bites Postural Changes and/or Swallow Maneuvers: Seated upright 90 degrees;Upright 30-60 min after meal(general Reflux precautions)                General recommendations: (Dietician) Oral Care Recommendations: Oral care BID;Patient independent with oral care Follow up Recommendations: None(pt appears at her baseline) SLP Visit Diagnosis: Dysphagia, unspecified (R13.10)(Esophageal phase dysmotility) Plan: All goals met(education given)       GO                Watson,Katherine 07/08/2017, 4:45 PM

## 2017-07-08 NOTE — Op Note (Signed)
[facility] Patient Name: Samantha Aguirre Procedure Date: 07/08/2017 1:54 PM MRN: [id number] Account #: [id number] Date of Birth: [birth year] Admit Type: Inpatient Age: 72 Room: [facility] ENDO ROOM 3 Gender: Female Note Status: Finalized Procedure:            Upper GI endoscopy Indications:          Iron deficiency anemia Providers:            [name] MD, MD Referring MD:         Tracie Harrier, MD (Referring MD) Medicines:            Propofol per Anesthesia Complications:        No immediate complications. Procedure:            Pre-Anesthesia Assessment:                       - Prior to the procedure, a History and Physical was                        performed, and patient medications and allergies were                        reviewed. The patient's tolerance of previous                        anesthesia was also reviewed. The risks and benefits of                        the procedure and the sedation options and risks were                        discussed with the patient. All questions were                        answered, and informed consent was obtained. Prior                        Anticoagulants: The patient has taken no previous                        anticoagulant or antiplatelet agents. ASA Grade                        Assessment: II - A patient with mild systemic disease.                        After reviewing the risks and benefits, the patient was                        deemed in satisfactory condition to undergo the                        procedure.                       After obtaining informed consent, the endoscope was                        passed under direct vision. Throughout the procedure,  the patient's blood pressure, pulse, and oxygen                        saturations were monitored continuously. The Endoscope                        was introduced through the mouth, and advanced to the                         second part of duodenum. The upper GI endoscopy was                        accomplished without difficulty. The patient tolerated                        the procedure well. Findings:      The examined esophagus was normal.      Lymphangiectasia was present in the gastric fundus.      Two 2 mm angiodysplastic lesions with bleeding were found in the second       portion of the duodenum. Coagulation for hemostasis using argon beam at       2 liters/minute and 30 watts was successful. Impression:           - Normal esophagus.                       - Gastric mucosal lymphangiectasia.                       - Two bleeding angiodysplastic lesions in the duodenum.                        Treated with argon beam coagulation.                       - No specimens collected. Recommendation:       - Return patient to hospital ward for ongoing care.                       - Clear liquid diet.                       - Continue present medications.                       - To visualize the small bowel, perform video capsule                        endoscopy. Procedure Code(s):    --- Professional ---                       [phone], Esophagogastroduodenoscopy, flexible, transoral;                        with control of bleeding, any method Diagnosis Code(s):    --- Professional ---                       D50.9, Iron deficiency anemia, unspecified                       K31.811, Angiodysplasia of stomach and duodenum with  bleeding                       I89.0, Lymphedema, not elsewhere classified CPT copyright 2017 American Medical Association. All rights reserved. The codes documented in this report are preliminary and upon coder review may  be revised to meet current compliance requirements. [name] MD, MD 07/08/2017 2:16:58 PM This report has been signed electronically. Number of Addenda: 0 Note Initiated On: 07/08/2017 1:54 PM      [facility]

## 2017-07-08 NOTE — Progress Notes (Signed)
[place] at [place] NAME: Samantha Aguirre    MR#:  [id number]  DATE OF BIRTH:  September 15, 1945  SUBJECTIVE:  CHIEF COMPLAINT:   Chief Complaint  Patient presents with  . Facial Droop  . Weakness    Pt with recent stroke and GI work ups, came with left sided weakness, resolved now. Have Significant drop in Hb in last 3 months, s/p 1 unit PRBC. Hb stable now, no complains. For EGD today.  REVIEW OF SYSTEMS:  CONSTITUTIONAL: No fever, fatigue or weakness.  EYES: No blurred or double vision.  EARS, NOSE, AND THROAT: No tinnitus or ear pain.  RESPIRATORY: No cough, shortness of breath, wheezing or hemoptysis.  CARDIOVASCULAR: No chest pain, orthopnea, edema.  GASTROINTESTINAL: No nausea, vomiting, diarrhea or abdominal pain.  GENITOURINARY: No dysuria, hematuria.  ENDOCRINE: No polyuria, nocturia,  HEMATOLOGY: No anemia, easy bruising or bleeding SKIN: No rash or lesion. MUSCULOSKELETAL: No joint pain or arthritis.   NEUROLOGIC: No tingling, numbness, weakness.  PSYCHIATRY: No anxiety or depression.   ROS  DRUG ALLERGIES:   Allergies  Allergen Reactions  . Mushroom Extract Complex Anaphylaxis    Made patient "deathly sick"  . Atenolol     "MD took me off of it bc it was doing something wrong" Made patient HYPOTENSIVE  . Ivp Dye [Iodinated Diagnostic Agents] Itching    Pt injected with IV contrast.  5 min after injection pt complained of itching behind ear and on her abd.  . Morphine And Related Other (See Comments)    "stops my breathing" NEAR-RESPIRATORY ARREST  . Percocet [Oxycodone-Acetaminophen] Nausea And Vomiting and Other (See Comments)    Stomach pains  . Percodan [Oxycodone-Aspirin] Nausea And Vomiting and Other (See Comments)    Stomach pains  . Verapamil Other (See Comments)    " MD told me this was screwing me up" MAKES PATIENT HYPOTENSIVE    VITALS:  Blood pressure (!) 146/54, pulse 62, temperature 98.3 F (36.8  C), temperature source Oral, resp. rate 15, height 5\' 1"  (1.549 m), weight 82.4 kg (181 lb 11.2 oz), SpO2 100 %.  PHYSICAL EXAMINATION:  GENERAL:  72 y.o.-year-old patient lying in the bed with no acute distress.  EYES: Pupils equal, round, reactive to light and accommodation. No scleral icterus. Extraocular muscles intact.  HEENT: Head atraumatic, normocephalic. Oropharynx and nasopharynx clear.  NECK:  Supple, no jugular venous distention. No thyroid enlargement, no tenderness.  LUNGS: Normal breath sounds bilaterally, no wheezing, rales,rhonchi or crepitation. No use of accessory muscles of respiration.  CARDIOVASCULAR: S1, S2 normal. No murmurs, rubs, or gallops.  ABDOMEN: Soft, nontender, nondistended. Bowel sounds present. No organomegaly or mass.  EXTREMITIES: No pedal edema, cyanosis, or clubbing.  NEUROLOGIC: Cranial nerves II through XII are intact. Muscle strength 4/5 in all extremities. Sensation intact. Gait not checked.  PSYCHIATRIC: The patient is alert and oriented x 3.  SKIN: No obvious rash, lesion, or ulcer.   Physical Exam LABORATORY PANEL:   CBC Recent Labs  Lab 07/08/17 0753  WBC 5.3  HGB 9.0*  HCT 28.8*  PLT 310   ------------------------------------------------------------------------------------------------------------------  Chemistries  Recent Labs  Lab 07/06/17 0936  07/08/17 0753  NA 140   < > 140  K 2.7*   < > 4.7  CL 105   < > 109  CO2 23   < > 25  GLUCOSE 156*   < > 141*  BUN 11   < > 9  CREATININE 0.42*   < >  0.49  CALCIUM 8.1*   < > 8.7*  MG  --    < > 2.2  AST 20  --   --   ALT 10*  --   --   ALKPHOS 88  --   --   BILITOT 0.5  --   --    < > = values in this interval not displayed.   ------------------------------------------------------------------------------------------------------------------  Cardiac Enzymes Recent Labs  Lab 07/06/17 0936  TROPONINI <0.03    ------------------------------------------------------------------------------------------------------------------  RADIOLOGY:  No results found.  ASSESSMENT AND PLAN:   Principal Problem:   GI bleed Active Problems:   Anemia   Angiodysplasia of stomach and duodenum with hemorrhage   Samantha Aguirre  is a 72 y.o. female with a known history of recurrent CVA, atrial fibrillation on eliquis, severe iron deficiency anemia with local G.I. bleeding as for workup with colonoscopy and endoscopy in February 2019 on iron, vitamin B12 deficiency, COPD with ongoing tobacco abuse, coronary artery disease comes to the emergency room with complaints of left upper and lower extremity weakness since last night. Patient normally uses a walker and a cane  1. left upper and lower extremity weakness with left facial drool r/o  Acute CVA. -has had history of stroke in the past most recent was right colonic stroke with similar symptoms of left upper and lower extremity weakness in March 2019 -continue aspirin -holding eliquis due to anemia. -Neurology consultation appreciated -MRI of the brain negative. -CT had negative -had echo, ultrasound carotid Doppler without any significant stenosis in March 2019. I will not repeat it. -PT, OT, speech therapy  2. severe microcytic anemia- likely GI bleed. -patient's hemoglobin has drifted from 11.0 in March 2019----7.7 (out pt June 2019)--6.7 on admission -is on eliquis for chronic a fib--- holding it -patient also has vitamin B12 deficiency - s/p one unit of blood transfusion -sHe had colonoscopy in February 2019 10 polyps anywhere from 7 to 10 mm were removed. G.I. felt that was the source of the bleeding given her being on eliquis. She also had EGD which was essentially negative in February 2019 - hematology consultation for IV iron therapy -G.I. Consultation appreciated- EGD done- vascular angiodysplasia in duodenum. --send stools for occult blood -  Gi planning for capsule video endoscopy.  3.COPD with ongoing tobacco abuse -sats remain stable -patient not motivated to quit smoking  4. Hypertension -resume home meds  5. Hyperlipidemia on statins  6. Chronic atrial fibrillation -rate controlled. -Holding eliquis..... Need to discuss with cardiology if this can be discontinued since patient is likely having a local G.I. Bleeding -her cardiologist is Dr. Clayborn Bigness  7. Dm-2 SSI and cont home meds    All the records are reviewed and case discussed with Care Management/Social Workerr. Management plans discussed with the patient, family and they are in agreement.  CODE STATUS: DNR  TOTAL TIME TAKING CARE OF THIS PATIENT: 35 minutes.    POSSIBLE D/C IN *1-2 DAYS, DEPENDING ON CLINICAL CONDITION.   Vaughan Basta M.D on 07/08/2017   Between 7am to 6pm - Pager - [phone]  After 6pm go to [email or web address] - password [email or web address] [facility]  Office  [phone]  CC: Primary care physician; Tracie Harrier, MD  Note: This dictation was prepared with Dragon dictation along with smaller phrase technology. Any transcriptional errors that result from this process are unintentional.

## 2017-07-08 NOTE — Progress Notes (Signed)
Pharmacy Electrolyte Monitoring Consult:  Pharmacy consulted to assist in monitoring and replacing electrolytes in this 72 y.o. female admitted on 07/06/2017 with Facial Droop and Weakness GI bleed  Labs:  Sodium (mmol/L)  Date Value  07/08/2017 140   Potassium (mmol/L)  Date Value  07/08/2017 4.7   Magnesium (mg/dL)  Date Value  07/08/2017 2.2   Phosphorus (mg/dL)  Date Value  07/06/2017 3.8   Calcium (mg/dL)  Date Value  07/08/2017 8.7 (L)   Albumin (g/dL)  Date Value  07/06/2017 3.2 (L)    Assessment/Plan: K and Mg are WNL. K=4.7. On admission K was 2.7. Pt is on 10 MEQ KCL BID at home. Currently on 20 MEQ TID and lasix 10mg . I will reduce the KCL dose to 20 MEQ BID. Recheck in 2 days.  Ramond Dial, Pharm.D, BCPS Clinical Pharmacist  07/08/2017 3:18 PM

## 2017-07-09 ENCOUNTER — Encounter
Admission: EM | Disposition: A | Discharge status: Other Acute Inpatient Hospital | Payer: Self-pay | Source: Home / Self Care | Attending: Internal Medicine

## 2017-07-09 DIAGNOSIS — D509 Iron deficiency anemia, unspecified: Secondary | ICD-10-CM | POA: Diagnosis not present

## 2017-07-09 DIAGNOSIS — D649 Anemia, unspecified: Secondary | ICD-10-CM | POA: Diagnosis not present

## 2017-07-09 HISTORY — PX: GIVENS CAPSULE STUDY: SHX5432

## 2017-07-09 LAB — CBC
HCT: 29.1 % — ABNORMAL LOW (ref 35.0–47.0)
HEMOGLOBIN: 9 g/dL — AB (ref 12.0–16.0)
MCH: 22.8 pg — ABNORMAL LOW (ref 26.0–34.0)
MCHC: 31.1 g/dL — AB (ref 32.0–36.0)
MCV: 73.4 fL — ABNORMAL LOW (ref 80.0–100.0)
Platelets: 333 10*3/uL (ref 150–440)
RBC: 3.97 MIL/uL (ref 3.80–5.20)
RDW: 25.3 % — ABNORMAL HIGH (ref 11.5–14.5)
WBC: 5.5 10*3/uL (ref 3.6–11.0)

## 2017-07-09 LAB — GLUCOSE, CAPILLARY
GLUCOSE-CAPILLARY: 113 mg/dL — AB (ref 70–99)
GLUCOSE-CAPILLARY: 103 mg/dL — AB (ref 70–99)
Glucose-Capillary: 115 mg/dL — ABNORMAL HIGH (ref 70–99)
Glucose-Capillary: 145 mg/dL — ABNORMAL HIGH (ref 70–99)

## 2017-07-09 LAB — OCCULT BLOOD X 1 CARD TO LAB, STOOL: Fecal Occult Bld: POSITIVE — AB

## 2017-07-09 SURGERY — IMAGING PROCEDURE, GI TRACT, INTRALUMINAL, VIA CAPSULE

## 2017-07-09 MED ORDER — ASPIRIN EC 325 MG PO TBEC
DELAYED_RELEASE_TABLET | ORAL | Status: AC
  Administered 2017-07-09: 325 mg
  Filled 2017-07-09: qty 1

## 2017-07-09 NOTE — Progress Notes (Signed)
Occupational Therapy Treatment Patient Details Name: Samantha Aguirre MRN: [id number] DOB: [birth year] Today's Date: 07/09/2017    History of present illness Pt is a 72 y.o. female with a known history of diastolic heart failure, bronchial asthma, atrial fibrillation, COPD, hyperlipidemia, diabetes mellitus, seizure disorder, skin cancer and recurrent CVA.  Here with some new onset L sided weakness, CT and MRI negative for acute CVA.   OT comments  Discussed vision strategies for feeding and set up for grooming skills at home.  Pt was uncomfortable with stomach cramps from EGD today and when trying to sit up EOB she had increased pain and session was ended due to increased pain and discomfort.  Recommended she do light massage to help get rid of air bubbles.  NSG aware of discomfort and will montior.    Follow Up Recommendations  Home health OT;Supervision - Intermittent    Equipment Recommendations  Tub/shower bench;Toilet rise with handles;Other (comment)    Recommendations for Other Services      Precautions / Restrictions Precautions Precautions: Fall Restrictions Weight Bearing Restrictions: No       Mobility Bed Mobility                  Transfers                      Balance                                           ADL either performed or assessed with clinical judgement   ADL Overall ADL's : Needs assistance/impaired   Eating/Feeding Details (indicate cue type and reason): Discussed vision strategies for feeding and set up for grooming skills at home.  Pt was uncomfortable with stomach cramps from EGD today and when trying to sit up EOB she had increased pain and session was ended due to increased pain and discomfort.                                         Vision Baseline Vision/History: Cataracts;Wears glasses Wears Glasses: Reading only Patient Visual Report: Blurring of vision;Diplopia     Perception      Praxis      Cognition Arousal/Alertness: Awake/alert Behavior During Therapy: Restless;WFL for tasks assessed/performed Overall Cognitive Status: No family/caregiver present to determine baseline cognitive functioning                                 General Comments: Pt oriented to self, month/year, and place; follows simple commands well, mild deficits noted with insight and safety awareness/awareness of deficits (masks fairly well).        Exercises     Shoulder Instructions       General Comments      Pertinent Vitals/ Pain       Pain Assessment: 0-10 Pain Score: 4  Pain Location: abdomen Pain Descriptors / Indicators: Cramping Pain Intervention(s): Limited activity within patient's tolerance;Monitored during session  Home Living                                          Prior Functioning/Environment  Frequency  Min 2X/week        Progress Toward Goals  OT Goals(current goals can now be found in the care plan section)  Progress towards OT goals: Progressing toward goals  Acute Rehab OT Goals Patient Stated Goal: Go home OT Goal Formulation: With patient Time For Goal Achievement: 07/21/17 Potential to Achieve Goals: Good  Plan Discharge plan remains appropriate    Co-evaluation                 AM-PAC PT "6 Clicks" Daily Activity     Outcome Measure   Help from another person eating meals?: A Little Help from another person taking care of personal grooming?: A Little Help from another person toileting, which includes using toliet, bedpan, or urinal?: A Little Help from another person bathing (including washing, rinsing, drying)?: A Little Help from another person to put on and taking off regular upper body clothing?: A Little Help from another person to put on and taking off regular lower body clothing?: A Little 6 Click Score: 18    End of Session    OT Visit Diagnosis: Other abnormalities  of gait and mobility (R26.89);History of falling (Z91.81);Muscle weakness (generalized) (M62.81);Other symptoms and signs involving cognitive function;Low vision, both eyes (H54.2)   Activity Tolerance Patient limited by pain   Patient Left in bed;with call bell/phone within reach;with bed alarm set   Nurse Communication          Time: 5784-6962 OT Time Calculation (min): 20 min  Charges: OT General Charges $OT Visit: 1 Visit OT Treatments $Self Care/Home Management : 8-22 mins  Chrys Racer, OTR/L ascom [phone] 07/09/17, 4:46 PM

## 2017-07-09 NOTE — Progress Notes (Signed)
[facility] [place] NAME: Samantha Aguirre    MR#:  [id number]  DATE OF BIRTH:  1945-11-05  SUBJECTIVE:  CHIEF COMPLAINT:   Chief Complaint  Patient presents with  . Facial Droop  . Weakness    Pt with recent stroke and GI work ups, came with left sided weakness, resolved now. Have Significant drop in Hb in last 3 months, s/p 1 unit PRBC. Hb stable now, no complains. EGD shows some angioextasias. Have capsule endoscopy today.  REVIEW OF SYSTEMS:  CONSTITUTIONAL: No fever, fatigue or weakness.  EYES: No blurred or double vision.  EARS, NOSE, AND THROAT: No tinnitus or ear pain.  RESPIRATORY: No cough, shortness of breath, wheezing or hemoptysis.  CARDIOVASCULAR: No chest pain, orthopnea, edema.  GASTROINTESTINAL: No nausea, vomiting, diarrhea or abdominal pain.  GENITOURINARY: No dysuria, hematuria.  ENDOCRINE: No polyuria, nocturia,  HEMATOLOGY: No anemia, easy bruising or bleeding SKIN: No rash or lesion. MUSCULOSKELETAL: No joint pain or arthritis.   NEUROLOGIC: No tingling, numbness, weakness.  PSYCHIATRY: No anxiety or depression.   ROS  DRUG ALLERGIES:   Allergies  Allergen Reactions  . Mushroom Extract Complex Anaphylaxis    Made patient "deathly sick"  . Atenolol     "MD took me off of it bc it was doing something wrong" Made patient HYPOTENSIVE  . Ivp Dye [Iodinated Diagnostic Agents] Itching    Pt injected with IV contrast.  5 min after injection pt complained of itching behind ear and on her abd.  . Morphine And Related Other (See Comments)    "stops my breathing" NEAR-RESPIRATORY ARREST  . Percocet [Oxycodone-Acetaminophen] Nausea And Vomiting and Other (See Comments)    Stomach pains  . Percodan [Oxycodone-Aspirin] Nausea And Vomiting and Other (See Comments)    Stomach pains  . Verapamil Other (See Comments)    " MD told me this was screwing me up" MAKES PATIENT HYPOTENSIVE    VITALS:  Blood pressure  123/62, pulse (!) 56, temperature 98.2 F (36.8 C), temperature source Oral, resp. rate 16, height 5\' 1"  (1.549 m), weight 82.4 kg (181 lb 11.2 oz), SpO2 100 %.  PHYSICAL EXAMINATION:  GENERAL:  72 y.o.-year-old patient lying in the bed with no acute distress.  EYES: Pupils equal, round, reactive to light and accommodation. No scleral icterus. Extraocular muscles intact.  HEENT: Head atraumatic, normocephalic. Oropharynx and nasopharynx clear.  NECK:  Supple, no jugular venous distention. No thyroid enlargement, no tenderness.  LUNGS: Normal breath sounds bilaterally, no wheezing, rales,rhonchi or crepitation. No use of accessory muscles of respiration.  CARDIOVASCULAR: S1, S2 normal. No murmurs, rubs, or gallops.  ABDOMEN: Soft, nontender, nondistended. Bowel sounds present. No organomegaly or mass.  EXTREMITIES: No pedal edema, cyanosis, or clubbing.  NEUROLOGIC: Cranial nerves II through XII are intact. Muscle strength 4/5 in all extremities. Sensation intact. Gait not checked.  PSYCHIATRIC: The patient is alert and oriented x 3.  SKIN: No obvious rash, lesion, or ulcer.   Physical Exam LABORATORY PANEL:   CBC Recent Labs  Lab 07/09/17 0820  WBC 5.5  HGB 9.0*  HCT 29.1*  PLT 333   ------------------------------------------------------------------------------------------------------------------  Chemistries  Recent Labs  Lab 07/06/17 0936  07/08/17 0753  NA 140   < > 140  K 2.7*   < > 4.7  CL 105   < > 109  CO2 23   < > 25  GLUCOSE 156*   < > 141*  BUN 11   < >  9  CREATININE 0.42*   < > 0.49  CALCIUM 8.1*   < > 8.7*  MG  --    < > 2.2  AST 20  --   --   ALT 10*  --   --   ALKPHOS 88  --   --   BILITOT 0.5  --   --    < > = values in this interval not displayed.   ------------------------------------------------------------------------------------------------------------------  Cardiac Enzymes Recent Labs  Lab 07/06/17 0936  TROPONINI <0.03    ------------------------------------------------------------------------------------------------------------------  RADIOLOGY:  No results found.  ASSESSMENT AND PLAN:   Principal Problem:   GI bleed Active Problems:   Anemia   Angiodysplasia of stomach and duodenum with hemorrhage   Samantha Aguirre  is a 72 y.o. female with a known history of recurrent CVA, atrial fibrillation on eliquis, severe iron deficiency anemia with local G.I. bleeding as for workup with colonoscopy and endoscopy in February 2019 on iron, vitamin B12 deficiency, COPD with ongoing tobacco abuse, coronary artery disease comes to the emergency room with complaints of left upper and lower extremity weakness since last night. Patient normally uses a walker and a cane  1. left upper and lower extremity weakness with left facial drool r/o  Acute CVA. -has had history of stroke in the past most recent was right colonic stroke with similar symptoms of left upper and lower extremity weakness in March 2019 -continue aspirin -holding eliquis due to anemia. -Neurology consultation appreciated -MRI of the brain negative. -CT had negative -had echo, ultrasound carotid Doppler without any significant stenosis in March 2019. I will not repeat it. -PT, OT, speech therapy  2. severe microcytic anemia- likely GI bleed. -patient's hemoglobin has drifted from 11.0 in March 2019----7.7 (out pt June 2019)--6.7 on admission -is on eliquis for chronic a fib--- holding it -patient also has vitamin B12 deficiency - s/p one unit of blood transfusion -sHe had colonoscopy in February 2019 10 polyps anywhere from 7 to 10 mm were removed. G.I. felt that was the source of the bleeding given her being on eliquis. She also had EGD which was essentially negative in February 2019 - hematology consultation for IV iron therapy -G.I. Consultation appreciated- EGD done- vascular angiodysplasia in duodenum. --send stools for occult blood -  Gi had done capsule video endoscopy. - may need to stop eliquis on discharge due to Gi bleed.  3.COPD with ongoing tobacco abuse -sats remain stable -patient not motivated to quit smoking  4. Hypertension -resume home meds  5. Hyperlipidemia on statins  6. Chronic atrial fibrillation -rate controlled. -Holding eliquis..... Need to discuss with cardiology if this can be discontinued since patient is likely having a local G.I. Bleeding -her cardiologist is Dr. Clayborn Bigness  7. Dm-2 SSI and cont home meds    All the records are reviewed and case discussed with Care Management/Social Workerr. Management plans discussed with the patient, family and they are in agreement.  CODE STATUS: DNR  TOTAL TIME TAKING CARE OF THIS PATIENT: 35 minutes.    POSSIBLE D/C IN *1-2 DAYS, DEPENDING ON CLINICAL CONDITION.   Vaughan Basta M.D on 07/09/2017   Between 7am to 6pm - Pager - [phone]  After 6pm go to [email or web address] - password [email or web address] [facility]  Office  [phone]  CC: Primary care physician; Tracie Harrier, MD  Note: This dictation was prepared with Dragon dictation along with smaller phrase technology. Any transcriptional errors that result from this process are unintentional.

## 2017-07-09 NOTE — Anesthesia Postprocedure Evaluation (Signed)
Anesthesia Post Note  Patient: Samantha Aguirre  Procedure(s) Performed: ESOPHAGOGASTRODUODENOSCOPY (EGD) WITH PROPOFOL (N/A )  Patient location during evaluation: Endoscopy Anesthesia Type: General Level of consciousness: awake and alert Pain management: pain level controlled Vital Signs Assessment: post-procedure vital signs reviewed and stable Respiratory status: spontaneous breathing, nonlabored ventilation, respiratory function stable and patient connected to nasal cannula oxygen Cardiovascular status: blood pressure returned to baseline and stable Postop Assessment: no apparent nausea or vomiting Anesthetic complications: no     Last Vitals:  Vitals:   07/09/17 1445 07/09/17 1952  BP: 139/64 (!) 96/54  Pulse: (!) 59 70  Resp: 18 20  Temp: 36.7 C 36.9 C  SpO2: 96% 93%    Last Pain:  Vitals:   07/09/17 1952  TempSrc: Oral  PainSc:                  Martha Clan

## 2017-07-09 NOTE — Consult Note (Signed)
   [facility] CM Inpatient Consult   07/09/2017  Samantha Aguirre [birth year] [id number]  Patient is currently active with [facility] for chronic disease management services.  Patient has been engaged by a [employer].  Our community based plan of care has focused on disease management and community resource support.  Patient will receive a post discharge transition of care call and will be evaluated for monthly home visits for assessments and disease process education.  Made Inpatient Case Manager aware that [facility] following. Of note, [facility] Care Management services does not replace or interfere with any services that are needed or arranged by inpatient case management or social work.  For additional questions or referrals please contact:  Arnie Maiolo RN, [facility] Liaison  [phone]) [place] [phone]) Toll free office

## 2017-07-09 NOTE — Plan of Care (Signed)
  Problem: Education: Goal: Knowledge of General Education information will improve Outcome: Progressing   Problem: Health Behavior/Discharge Planning: Goal: Ability to manage health-related needs will improve Outcome: Progressing   Problem: Clinical Measurements: Goal: Ability to maintain clinical measurements within normal limits will improve Outcome: Progressing Goal: Will remain free from infection Outcome: Progressing Goal: Diagnostic test results will improve Outcome: Progressing Goal: Respiratory complications will improve Outcome: Progressing Goal: Cardiovascular complication will be avoided Outcome: Progressing   

## 2017-07-09 NOTE — Plan of Care (Signed)
Problem: Education: Goal: Knowledge of General Education information will improve 07/09/2017 1057 by Herbie Baltimore, RN Outcome: Progressing 07/09/2017 1018 by Herbie Baltimore, RN Outcome: Progressing   Problem: Health Behavior/Discharge Planning: Goal: Ability to manage health-related needs will improve 07/09/2017 1057 by Herbie Baltimore, RN Outcome: Progressing 07/09/2017 1018 by Herbie Baltimore, RN Outcome: Progressing   Problem: Clinical Measurements: Goal: Ability to maintain clinical measurements within normal limits will improve 07/09/2017 1057 by Herbie Baltimore, RN Outcome: Progressing 07/09/2017 1018 by Herbie Baltimore, RN Outcome: Progressing Goal: Will remain free from infection 07/09/2017 1057 by Herbie Baltimore, RN Outcome: Progressing 07/09/2017 1018 by Herbie Baltimore, RN Outcome: Progressing Goal: Diagnostic test results will improve 07/09/2017 1057 by Herbie Baltimore, RN Outcome: Progressing 07/09/2017 1018 by Herbie Baltimore, RN Outcome: Progressing Goal: Respiratory complications will improve 07/09/2017 1057 by Herbie Baltimore, RN Outcome: Progressing 07/09/2017 1018 by Herbie Baltimore, RN Outcome: Progressing Goal: Cardiovascular complication will be avoided 07/09/2017 1057 by Herbie Baltimore, RN Outcome: Progressing 07/09/2017 1018 by Herbie Baltimore, RN Outcome: Progressing   Problem: Activity: Goal: Risk for activity intolerance will decrease 07/09/2017 1057 by Herbie Baltimore, RN Outcome: Progressing 07/09/2017 1018 by Herbie Baltimore, RN Outcome: Progressing   Problem: Nutrition: Goal: Adequate nutrition will be maintained 07/09/2017 1057 by Herbie Baltimore, RN Outcome: Progressing 07/09/2017 1018 by Herbie Baltimore, RN Outcome: Progressing   Problem: Coping: Goal: Level of anxiety will decrease 07/09/2017 1057 by Herbie Baltimore, RN Outcome:  Progressing 07/09/2017 1018 by Herbie Baltimore, RN Outcome: Progressing   Problem: Elimination: Goal: Will not experience complications related to bowel motility 07/09/2017 1057 by Herbie Baltimore, RN Outcome: Progressing 07/09/2017 1018 by Herbie Baltimore, RN Outcome: Progressing Goal: Will not experience complications related to urinary retention 07/09/2017 1057 by Herbie Baltimore, RN Outcome: Progressing 07/09/2017 1018 by Herbie Baltimore, RN Outcome: Progressing   Problem: Pain Managment: Goal: General experience of comfort will improve 07/09/2017 1057 by Herbie Baltimore, RN Outcome: Progressing 07/09/2017 1018 by Herbie Baltimore, RN Outcome: Progressing   Problem: Safety: Goal: Ability to remain free from injury will improve 07/09/2017 1057 by Herbie Baltimore, RN Outcome: Progressing 07/09/2017 1018 by Herbie Baltimore, RN Outcome: Progressing   Problem: Skin Integrity: Goal: Risk for impaired skin integrity will decrease 07/09/2017 1057 by Herbie Baltimore, RN Outcome: Progressing 07/09/2017 1018 by Herbie Baltimore, RN Outcome: Progressing   Problem: Education: Goal: Knowledge of disease or condition will improve 07/09/2017 1057 by Herbie Baltimore, RN Outcome: Progressing 07/09/2017 1018 by Herbie Baltimore, RN Outcome: Progressing Goal: Knowledge of secondary prevention will improve 07/09/2017 1057 by Herbie Baltimore, RN Outcome: Progressing 07/09/2017 1018 by Herbie Baltimore, RN Outcome: Progressing Goal: Knowledge of patient specific risk factors addressed and post discharge goals established will improve 07/09/2017 1057 by Herbie Baltimore, RN Outcome: Progressing 07/09/2017 1018 by Herbie Baltimore, RN Outcome: Progressing   Problem: Coping: Goal: Will identify appropriate support needs 07/09/2017 1057 by Herbie Baltimore, RN Outcome: Progressing 07/09/2017 1018 by Herbie Baltimore, RN Outcome: Progressing   Problem: Health Behavior/Discharge Planning: Goal: Ability to manage health-related needs will improve 07/09/2017 1057 by Herbie Baltimore, RN Outcome: Progressing 07/09/2017 1018 by Herbie Baltimore, RN Outcome: Progressing   Problem: Self-Care: Goal: Ability to participate in self-care as condition permits will improve 07/09/2017 1057 by Herbie Baltimore, RN Outcome: Progressing 07/09/2017 1018  by Herbie Baltimore, RN Outcome: Progressing Goal: Ability to communicate needs accurately will improve 07/09/2017 1057 by Herbie Baltimore, RN Outcome: Progressing 07/09/2017 1018 by Herbie Baltimore, RN Outcome: Progressing   Problem: Nutrition: Goal: Risk of aspiration will decrease 07/09/2017 1057 by Herbie Baltimore, RN Outcome: Progressing 07/09/2017 1018 by Herbie Baltimore, RN Outcome: Progressing   Problem: Ischemic Stroke/TIA Tissue Perfusion: Goal: Complications of ischemic stroke/TIA will be minimized 07/09/2017 1057 by Herbie Baltimore, RN Outcome: Progressing 07/09/2017 1018 by Herbie Baltimore, RN Outcome: Progressing

## 2017-07-10 ENCOUNTER — Encounter: Payer: Self-pay | Admitting: Gastroenterology

## 2017-07-10 LAB — BASIC METABOLIC PANEL
Anion gap: 7 (ref 5–15)
BUN: 11 mg/dL (ref 8–23)
CO2: 24 mmol/L (ref 22–32)
CREATININE: 0.46 mg/dL (ref 0.44–1.00)
Calcium: 8.5 mg/dL — ABNORMAL LOW (ref 8.9–10.3)
Chloride: 108 mmol/L (ref 98–111)
GFR calc Af Amer: >60 mL/min (ref >60)
GLUCOSE: 110 mg/dL — AB (ref 70–99)
Potassium: 4.3 mmol/L (ref 3.5–5.1)
SODIUM: 139 mmol/L (ref 135–145)

## 2017-07-10 LAB — GLUCOSE, CAPILLARY
GLUCOSE-CAPILLARY: 159 mg/dL — AB (ref 70–99)
Glucose-Capillary: 108 mg/dL — ABNORMAL HIGH (ref 70–99)
Glucose-Capillary: 95 mg/dL (ref 70–99)
Glucose-Capillary: 109 mg/dL — ABNORMAL HIGH (ref 70–99)

## 2017-07-10 LAB — CBC
HEMATOCRIT: 28.3 % — AB (ref 35.0–47.0)
Hemoglobin: 9.2 g/dL — ABNORMAL LOW (ref 12.0–16.0)
MCH: 23.9 pg — ABNORMAL LOW (ref 26.0–34.0)
MCHC: 32.4 g/dL (ref 32.0–36.0)
MCV: 73.9 fL — AB (ref 80.0–100.0)
PLATELETS: 334 10*3/uL (ref 150–440)
RBC: 3.83 MIL/uL (ref 3.80–5.20)
RDW: 25.9 % — AB (ref 11.5–14.5)
WBC: 6.1 10*3/uL (ref 3.6–11.0)

## 2017-07-10 MED ORDER — ASPIRIN EC 81 MG PO TBEC: 81.0000 mg | DELAYED_RELEASE_TABLET | Freq: Every day | ORAL | 2 refills | Status: DC

## 2017-07-10 NOTE — Discharge Summary (Addendum)
[facility] [place] NAME: Ohanna Gassert    MR#:  [id number]  DATE OF BIRTH:  [birth year]  DATE OF ADMISSION:  07/06/2017 ADMITTING PHYSICIAN: Fritzi Mandes, MD  DATE OF DISCHARGE: 07/10/2017  PRIMARY CARE PHYSICIAN: Tracie Harrier, MD    ADMISSION DIAGNOSIS:  Cerebrovascular accident (CVA), unspecified mechanism ([place]) [I63.9]  DISCHARGE DIAGNOSIS:  Principal Problem:   GI bleed Active Problems:   Anemia   Angiodysplasia of stomach and duodenum with hemorrhage   SECONDARY DIAGNOSIS:   Past Medical History:  Diagnosis Date  . Acid reflux   . Arthritis    Bilateral knees, hands, feet  . Asthma   . Atrial fibrillation ([place])   . Cancer ([place])   . Chest pain   . CHF (congestive heart failure) (HCC)    Diastolic CHF  . COPD (chronic obstructive pulmonary disease) ([place])   . Coronary artery disease   . Diabetes mellitus without complication ([place])   . Frequent headaches   . Heart attack ([place])   . High cholesterol   . HLD (hyperlipidemia)   . Hypertension   . MI (myocardial infarction) ([place])   . Seizures ([place])   . Skin cancer 2015   Suspected basal cell carcinoma on nose, surgically removed  . Stroke ([place])   . Thyroid disease   . Tremors of nervous system     HOSPITAL COURSE:   JanetColapietrois a72 y.o.femalewith a known history of recurrent CVA, atrial fibrillation on eliquis, severe iron deficiency anemia with local G.I. bleeding as for workup with colonoscopy and endoscopy in February 2019 on iron,vitamin B12 deficiency, COPD with ongoing tobacco abuse, coronary artery disease comes to the emergency room with complaints of left upper and lower extremity weakness since last night. Patient normally uses a walker and a cane  1.left upper and lower extremity weakness with left facial drool r/o  Acute CVA. -has had history of stroke in the past most recent was right colonic stroke with similar symptoms of left  upper and lower extremity weakness in March 2019 -continue aspirin -holding eliquis due to anemia. -Neurology consultation appreciated -MRI of the brain negative. -CT had negative -had echo, ultrasound carotid Doppler without any significant stenosis in March 2019. I will not repeat it. -PT, OT, speech therapy  2.severe microcytic anemia- likely GI bleed. -patient's hemoglobin has drifted from 11.0 in March 2019----7.7 (out pt June 2019)--6.7 on admission -is on eliquis for chronic a fib---holding it -patient also has vitamin B12 deficiency - s/p one unit of blood transfusion- oraL IRON on discharge. -sHe had colonoscopyin February 2019 10 polyps anywhere from 7 to 10 mm were removed. G.I. felt that was the source of the bleeding given her being on eliquis. She also had EGDwhich was essentially negative in February 2019 - hematology consultation for IV iron therapy -G.I. Consultation appreciated- EGD done- vascular angiodysplasia in duodenum. --send stools for occult blood - Gi had done capsule video endoscopy. Showed multiple polyps and bleed, requested transfer to [facility]. - need to stop eliquis on discharge due to Gi bleed.  3.COPD with ongoing tobacco abuse -sats remain stable -patient not motivated to quit smoking  4.Hypertension -resume home meds   BP was running on lower normal side, so lisinopril is discontinued.  5.Hyperlipidemia on statins  6.Chronic atrial fibrillation -rate controlled. -Holding eliquis...Marland KitchenMarland KitchenNeed to discuss with cardiology if this can be discontinued since patient is likely having a local G.I. Bleeding, restart eliquis when permitted from GI. -her cardiologist  is Dr. Clayborn Bigness  7. Dm-2 SSI and cont home meds    DISCHARGE CONDITIONS:   Stable.  CONSULTS OBTAINED:  Treatment Team:  Catarina Hartshorn, MD Alexis Goodell, MD Lequita Asal, MD [name], MD  DRUG ALLERGIES:   Allergies  Allergen Reactions  . Mushroom  Extract Complex Anaphylaxis    Made patient "deathly sick"  . Atenolol     "MD took me off of it bc it was doing something wrong" Made patient HYPOTENSIVE  . Ivp Dye [Iodinated Diagnostic Agents] Itching    Pt injected with IV contrast.  5 min after injection pt complained of itching behind ear and on her abd.  . Morphine And Related Other (See Comments)    "stops my breathing" NEAR-RESPIRATORY ARREST  . Percocet [Oxycodone-Acetaminophen] Nausea And Vomiting and Other (See Comments)    Stomach pains  . Percodan [Oxycodone-Aspirin] Nausea And Vomiting and Other (See Comments)    Stomach pains  . Verapamil Other (See Comments)    " MD told me this was screwing me up" MAKES PATIENT HYPOTENSIVE    DISCHARGE MEDICATIONS:   Allergies as of 07/10/2017      Reactions   Mushroom Extract Complex Anaphylaxis   Made patient "deathly sick"   Atenolol    "MD took me off of it bc it was doing something wrong" Made patient HYPOTENSIVE   Ivp Dye [iodinated Diagnostic Agents] Itching   Pt injected with IV contrast.  5 min after injection pt complained of itching behind ear and on her abd.   Morphine And Related Other (See Comments)   "stops my breathing" NEAR-RESPIRATORY ARREST   Percocet [oxycodone-acetaminophen] Nausea And Vomiting, Other (See Comments)   Stomach pains   Percodan [oxycodone-aspirin] Nausea And Vomiting, Other (See Comments)   Stomach pains   Verapamil Other (See Comments)   " MD told me this was screwing me up" MAKES PATIENT HYPOTENSIVE      Medication List    STOP taking these medications   apixaban 5 MG Tabs tablet Commonly known as:  ELIQUIS   lisinopril 20 MG tablet Commonly known as:  PRINIVIL,ZESTRIL     TAKE these medications   albuterol 108 (90 Base) MCG/ACT inhaler Commonly known as:  PROVENTIL HFA;VENTOLIN HFA Inhale 2 puffs into the lungs every 6 (six) hours as needed for wheezing or shortness of breath.   aspirin EC 81 MG tablet Take 1 tablet (81  mg total) by mouth daily.   atorvastatin 80 MG tablet Commonly known as:  LIPITOR Take 1 tablet (80 mg total) by mouth daily at 6 PM.   diclofenac sodium 1 % Gel Commonly known as:  VOLTAREN Apply 2 g topically 4 (four) times daily.   DULoxetine 60 MG capsule Commonly known as:  CYMBALTA Take 1 capsule (60 mg total) by mouth at bedtime.   ferrous sulfate 325 (65 FE) MG EC tablet Take 1 tablet (325 mg total) by mouth 2 (two) times daily with a meal.   fluticasone 50 MCG/ACT nasal spray Commonly known as:  FLONASE Place 1 spray into both nostrils daily.   furosemide 20 MG tablet Commonly known as:  LASIX Take 10 mg by mouth daily.   gabapentin 800 MG tablet Commonly known as:  NEURONTIN Take 1 tablet (800 mg total) by mouth 2 (two) times daily.   glipiZIDE-metformin 5-500 MG tablet Commonly known as:  METAGLIP Take 1 tablet by mouth 2 (two) times daily before a meal.   levETIRAcetam 500 MG tablet Commonly known  as:  KEPPRA Take 1 tablet (500 mg total) by mouth 2 (two) times daily.   levothyroxine 175 MCG tablet Commonly known as:  SYNTHROID, LEVOTHROID Take 1 tablet (175 mcg total) by mouth daily before breakfast. What changed:  how much to take   nitroGLYCERIN 0.4 MG SL tablet Commonly known as:  NITROSTAT Place 1 tablet (0.4 mg total) under the tongue every 5 (five) minutes as needed for chest pain.   omeprazole 20 MG capsule Commonly known as:  PRILOSEC Take 20 mg by mouth 2 (two) times daily.   potassium chloride 10 MEQ CR capsule Commonly known as:  MICRO-K Take 1 capsule (10 mEq total) by mouth daily. What changed:  when to take this   sucralfate 1 g tablet Commonly known as:  CARAFATE TAKE (1) TABLET BY MOUTH FOUR TIMES A DAY   umeclidinium bromide 62.5 MCG/INH Aepb Commonly known as:  INCRUSE ELLIPTA Inhale 1 puff into the lungs daily.   vitamin B-12 1000 MCG tablet Commonly known as:  CYANOCOBALAMIN Take 1 tablet by mouth daily.         DISCHARGE INSTRUCTIONS:    Follow with GI and PMD in 1-2 weeks.  If you experience worsening of your admission symptoms, develop shortness of breath, life threatening emergency, suicidal or homicidal thoughts you must seek medical attention immediately by calling 911 or calling your MD immediately  if symptoms less severe.  You Must read complete instructions/literature along with all the possible adverse reactions/side effects for all the Medicines you take and that have been prescribed to you. Take any new Medicines after you have completely understood and accept all the possible adverse reactions/side effects.   Please note  You were cared for by a hospitalist during your hospital stay. If you have any questions about your discharge medications or the care you received while you were in the hospital after you are discharged, you can call the unit and asked to speak with the hospitalist on call if the hospitalist that took care of you is not available. Once you are discharged, your primary care physician will handle any further medical issues. Please note that NO REFILLS for any discharge medications will be authorized once you are discharged, as it is imperative that you return to your primary care physician (or establish a relationship with a primary care physician if you do not have one) for your aftercare needs so that they can reassess your need for medications and monitor your lab values.    Today   CHIEF COMPLAINT:   Chief Complaint  Patient presents with  . Facial Droop  . Weakness    HISTORY OF PRESENT ILLNESS:  Leinani Lisbon  is a 72 y.o. female with a known history of recurrent CVA, atrial fibrillation on eliquis, severe iron deficiency anemia with local G.I. bleeding as for workup with colonoscopy and endoscopy in February 2019 on iron, vitamin B12 deficiency, COPD with ongoing tobacco abuse, coronary artery disease comes to the emergency room with complaints of  left upper and lower extremity weakness since last night. Patient normally uses a walker and a cane. Denies any fall. Lives at home by herself. Children visit her.   VITAL SIGNS:  Blood pressure 97/70, pulse 65, temperature 98.7 F (37.1 C), temperature source Oral, resp. rate 16, height 5\' 1"  (1.549 m), weight 82.4 kg (181 lb 11.2 oz), SpO2 97 %.  I/O:    Intake/Output Summary (Last 24 hours) at 07/10/2017 0937 Last data filed at 07/09/2017 1830 Gross  per 24 hour  Intake 780 ml  Output -  Net 780 ml    PHYSICAL EXAMINATION:   GENERAL:  72 y.o.-year-old patient lying in the bed with no acute distress.  EYES: Pupils equal, round, reactive to light and accommodation. No scleral icterus. Extraocular muscles intact.  HEENT: Head atraumatic, normocephalic. Oropharynx and nasopharynx clear.  NECK:  Supple, no jugular venous distention. No thyroid enlargement, no tenderness.  LUNGS: Normal breath sounds bilaterally, no wheezing, rales,rhonchi or crepitation. No use of accessory muscles of respiration.  CARDIOVASCULAR: S1, S2 normal. No murmurs, rubs, or gallops.  ABDOMEN: Soft, nontender, nondistended. Bowel sounds present. No organomegaly or mass.  EXTREMITIES: No pedal edema, cyanosis, or clubbing.  NEUROLOGIC: Cranial nerves II through XII are intact. Muscle strength 4/5 in all extremities. Sensation intact. Gait not checked.  PSYCHIATRIC: The patient is alert and oriented x 3.  SKIN: No obvious rash, lesion, or ulcer    DATA REVIEW:   CBC Recent Labs  Lab 07/10/17 0800  WBC 6.1  HGB 9.2*  HCT 28.3*  PLT 334    Chemistries  Recent Labs  Lab 07/06/17 0936  07/08/17 0753 07/10/17 0800  NA 140   < > 140 139  K 2.7*   < > 4.7 4.3  CL 105   < > 109 108  CO2 23   < > 25 24  GLUCOSE 156*   < > 141* 110*  BUN 11   < > 9 11  CREATININE 0.42*   < > 0.49 0.46  CALCIUM 8.1*   < > 8.7* 8.5*  MG  --    < > 2.2  --   AST 20  --   --   --   ALT 10*  --   --   --   ALKPHOS 88   --   --   --   BILITOT 0.5  --   --   --    < > = values in this interval not displayed.    Cardiac Enzymes Recent Labs  Lab 07/06/17 0936  TROPONINI <0.03    Microbiology Results  Results for orders placed or performed during the hospital encounter of 03/27/17  Culture, blood (routine x 2)     Status: None   Collection Time: 03/27/17 12:44 PM  Result Value Ref Range Status   Specimen Description BLOOD LAC  Final   Special Requests   Final    BOTTLES DRAWN AEROBIC AND ANAEROBIC Blood Culture adequate volume   Culture   Final    NO GROWTH 5 DAYS Performed at [facility], [address] [place], [place] [address]    Report Status 04/01/2017 FINAL  Final  Culture, blood (routine x 2)     Status: None   Collection Time: 03/27/17 12:44 PM  Result Value Ref Range Status   Specimen Description BLOOD RAC  Final   Special Requests   Final    BOTTLES DRAWN AEROBIC AND ANAEROBIC Blood Culture adequate volume   Culture   Final    NO GROWTH 5 DAYS Performed at [facility], [address] [place], [place] [address]    Report Status 04/01/2017 FINAL  Final    RADIOLOGY:  No results found.  EKG:   Orders placed or performed during the hospital encounter of 07/06/17  . ED EKG  . ED EKG  . EKG 12-Lead  . EKG 12-Lead  . EKG 12-Lead  . EKG 12-Lead  . EKG  Management plans discussed with the patient, family and they are in agreement.  CODE STATUS:     Code Status Orders  (From admission, onward)        Start     Ordered   07/06/17 1314  Do not attempt resuscitation (DNR)  Continuous    Question Answer Comment  In the event of cardiac or respiratory ARREST Do not call a "code blue"   In the event of cardiac or respiratory ARREST Do not perform Intubation, CPR, defibrillation or ACLS   In the event of cardiac or respiratory ARREST Use medication by any route, position, wound care, and other measures to relive pain and suffering. May use  oxygen, suction and manual treatment of airway obstruction as needed for comfort.      07/06/17 1313    Code Status History    Date Active Date Inactive Code Status Order ID Comments User Context   04/03/2017 2010 04/08/2017 2102 DNR [id number]  Demetrios Loll, MD Inpatient   03/27/2017 1519 04/02/2017 1517 Full Code [id number]  Saundra Shelling, MD Inpatient   02/18/2017 1623 02/21/2017 1919 Full Code [id number]  Salary, Avel Peace, MD Inpatient   10/03/2016 1841 10/04/2016 1739 Full Code [id number]  Demetrios Loll, MD Inpatient   02/27/2016 1744 02/28/2016 2034 Full Code [id number]  Vaughan Basta, MD Inpatient   02/19/2016 1553 02/21/2016 2041 Full Code [id number]  Gladstone Lighter, MD Inpatient   12/27/2015 1309 01/01/2016 1833 DNR [id number]  Loletha Grayer, MD ED   09/16/2015 1852 09/19/2015 1929 Full Code [id number]  Baxter Hire, MD Inpatient   08/16/2015 1714 08/19/2015 1735 DNR [id number]  Willia Craze, NP Inpatient    Advance Directive Documentation     Most Recent Value  Type of Advance Directive  Out of facility DNR (pink MOST or yellow form)  Pre-existing out of facility DNR order (yellow form or pink MOST form)  Yellow form placed in chart (order not valid for inpatient use)  "MOST" Form in Place?  -      TOTAL TIME TAKING CARE OF THIS PATIENT: 90 minutes.  Initially worked on [employer] the pt home, but later as per GI suggestion- spoke to [facility] GI an explained pt and nurse about the plan.  Vaughan Basta M.D on 07/10/2017 at 9:37 AM  Between 7am to 6pm - Pager - [phone]  After 6pm go to [email or web address] - password [email or web address] [facility]  Office  [phone]  CC: Primary care physician; Tracie Harrier, MD   Note: This dictation was prepared with Dragon dictation along with smaller phrase technology. Any transcriptional errors that result from this process are unintentional.

## 2017-07-10 NOTE — Progress Notes (Addendum)
[facility] [place] NAME: Samantha Aguirre    MR#:  [id number]  DATE OF BIRTH:  04-02-1945  SUBJECTIVE:  CHIEF COMPLAINT:   Chief Complaint  Patient presents with  . Facial Droop  . Weakness    Pt with recent stroke and GI work ups, came with left sided weakness, resolved now. Have Significant drop in Hb in last 3 months, s/p 1 unit PRBC. Hb stable now, no complains. EGD shows some angioextasias. Had capsule endoscopy- was showing multiple active bleed per GI.  REVIEW OF SYSTEMS:  CONSTITUTIONAL: No fever, fatigue or weakness.  EYES: No blurred or double vision.  EARS, NOSE, AND THROAT: No tinnitus or ear pain.  RESPIRATORY: No cough, shortness of breath, wheezing or hemoptysis.  CARDIOVASCULAR: No chest pain, orthopnea, edema.  GASTROINTESTINAL: No nausea, vomiting, diarrhea or abdominal pain.  GENITOURINARY: No dysuria, hematuria.  ENDOCRINE: No polyuria, nocturia,  HEMATOLOGY: No anemia, easy bruising or bleeding SKIN: No rash or lesion. MUSCULOSKELETAL: No joint pain or arthritis.   NEUROLOGIC: No tingling, numbness, weakness.  PSYCHIATRY: No anxiety or depression.   ROS  DRUG ALLERGIES:   Allergies  Allergen Reactions  . Mushroom Extract Complex Anaphylaxis    Made patient "deathly sick"  . Atenolol     "MD took me off of it bc it was doing something wrong" Made patient HYPOTENSIVE  . Ivp Dye [Iodinated Diagnostic Agents] Itching    Pt injected with IV contrast.  5 min after injection pt complained of itching behind ear and on her abd.  . Morphine And Related Other (See Comments)    "stops my breathing" NEAR-RESPIRATORY ARREST  . Percocet [Oxycodone-Acetaminophen] Nausea And Vomiting and Other (See Comments)    Stomach pains  . Percodan [Oxycodone-Aspirin] Nausea And Vomiting and Other (See Comments)    Stomach pains  . Verapamil Other (See Comments)    " MD told me this was screwing me up" MAKES PATIENT HYPOTENSIVE     VITALS:  Blood pressure (!) 126/53, pulse 63, temperature 98.3 F (36.8 C), temperature source Oral, resp. rate 16, height 5\' 1"  (1.549 m), weight 82.4 kg (181 lb 11.2 oz), SpO2 99 %.  PHYSICAL EXAMINATION:  GENERAL:  72 y.o.-year-old patient lying in the bed with no acute distress.  EYES: Pupils equal, round, reactive to light and accommodation. No scleral icterus. Extraocular muscles intact.  HEENT: Head atraumatic, normocephalic. Oropharynx and nasopharynx clear.  NECK:  Supple, no jugular venous distention. No thyroid enlargement, no tenderness.  LUNGS: Normal breath sounds bilaterally, no wheezing, rales,rhonchi or crepitation. No use of accessory muscles of respiration.  CARDIOVASCULAR: S1, S2 normal. No murmurs, rubs, or gallops.  ABDOMEN: Soft, nontender, nondistended. Bowel sounds present. No organomegaly or mass.  EXTREMITIES: No pedal edema, cyanosis, or clubbing.  NEUROLOGIC: Cranial nerves II through XII are intact. Muscle strength 4/5 in all extremities. Sensation intact. Gait not checked.  PSYCHIATRIC: The patient is alert and oriented x 3.  SKIN: No obvious rash, lesion, or ulcer.   Physical Exam LABORATORY PANEL:   CBC Recent Labs  Lab 07/10/17 0800  WBC 6.1  HGB 9.2*  HCT 28.3*  PLT 334   ------------------------------------------------------------------------------------------------------------------  Chemistries  Recent Labs  Lab 07/06/17 0936  07/08/17 0753 07/10/17 0800  NA 140   < > 140 139  K 2.7*   < > 4.7 4.3  CL 105   < > 109 108  CO2 23   < > 25 24  GLUCOSE  156*   < > 141* 110*  BUN 11   < > 9 11  CREATININE 0.42*   < > 0.49 0.46  CALCIUM 8.1*   < > 8.7* 8.5*  MG  --    < > 2.2  --   AST 20  --   --   --   ALT 10*  --   --   --   ALKPHOS 88  --   --   --   BILITOT 0.5  --   --   --    < > = values in this interval not displayed.    ------------------------------------------------------------------------------------------------------------------  Cardiac Enzymes Recent Labs  Lab 07/06/17 0936  TROPONINI <0.03   ------------------------------------------------------------------------------------------------------------------  RADIOLOGY:  No results found.  ASSESSMENT AND PLAN:   Principal Problem:   GI bleed Active Problems:   Anemia   Angiodysplasia of stomach and duodenum with hemorrhage   Samantha Aguirre  is a 72 y.o. female with a known history of recurrent CVA, atrial fibrillation on eliquis, severe iron deficiency anemia with local G.I. bleeding as for workup with colonoscopy and endoscopy in February 2019 on iron, vitamin B12 deficiency, COPD with ongoing tobacco abuse, coronary artery disease comes to the emergency room with complaints of left upper and lower extremity weakness since last night. Patient normally uses a walker and a cane  1. left upper and lower extremity weakness with left facial drool r/o  Acute CVA. -has had history of stroke in the past most recent was right colonic stroke with similar symptoms of left upper and lower extremity weakness in March 2019 -continue aspirin -holding eliquis due to anemia. -Neurology consultation appreciated -MRI of the brain negative. -CT had negative -had echo, ultrasound carotid Doppler without any significant stenosis in March 2019. I will not repeat it. -PT, OT, speech therapy  2. severe microcytic anemia- GI bleed. -patient's hemoglobin has drifted from 11.0 in March 2019----7.7 (out pt June 2019)--6.7 on admission -is on eliquis for chronic a fib--- holding it -patient also has vitamin B12 deficiency - s/p one unit of blood transfusion -sHe had colonoscopy in February 2019 10 polyps anywhere from 7 to 10 mm were removed. G.I. felt that was the source of the bleeding given her being on eliquis. She also had EGD which was essentially  negative in February 2019 - hematology consultation for IV iron therapy -G.I. Consultation appreciated- EGD done- vascular angiodysplasia in duodenum. --send stools for occult blood - Gi had done capsule video endoscopy. -  need to stop eliquis on discharge due to Gi bleed. - I was planning to discharg, but GI called with finding of multiple active bleed sites in small intestine and suggested transfer to [facility]- spoke to Dr. Prescott Parma in GI- accepted pt and on waiting list.- we need to update them about any changes in pt's condition. - [facility] requested to send them the original images of pt's EGD and Capsule studies.  3.COPD with ongoing tobacco abuse -sats remain stable -patient not motivated to quit smoking  4. Hypertension -resume home meds  5. Hyperlipidemia on statins  6. Chronic atrial fibrillation -rate controlled. -Holding eliquis..... Need to discuss with cardiology if this can be discontinued since patient is likely having a local G.I. Bleeding -her cardiologist is Dr. Clayborn Bigness - Hold Eliquis until GI clears.  7. Dm-2 SSI and cont home meds   All the records are reviewed and case discussed with Care Management/Social Workerr. Management plans discussed with the patient,  family and they are in agreement.  CODE STATUS: DNR  TOTAL TIME TAKING CARE OF THIS PATIENT: 35 minutes.    POSSIBLE D/C IN *1-2 DAYS, DEPENDING ON CLINICAL CONDITION.   Vaughan Basta M.D on 07/10/2017   Between 7am to 6pm - Pager - [phone]  After 6pm go to [email or web address] - password [email or web address] [facility]  Office  [phone]  CC: Primary care physician; Tracie Harrier, MD  Note: This dictation was prepared with Dragon dictation along with smaller phrase technology. Any transcriptional errors that result from this process are unintentional.

## 2017-07-10 NOTE — Progress Notes (Signed)
Dr. Vicente Males called me, pt have multiple active bleed in her small intestine. She need to get transferred to [facility] for further care ( Enteroscopy)  I spoke to pt and her nurse, cancel discharge.  Time spent 20 min.

## 2017-07-10 NOTE — Care Management Important Message (Signed)
Important Message  Patient Details  Name: Samantha Aguirre MRN: [id number] Date of Birth: Apr 12, 1945   Medicare Important Message Given:  Yes    Juliann Pulse A Gabino Hagin 07/10/2017, 9:38 AM

## 2017-07-10 NOTE — Progress Notes (Addendum)
Pharmacy Electrolyte Monitoring Consult:  Pharmacy consulted to assist in monitoring and replacing electrolytes in this 72 y.o. female admitted on 07/06/2017 with Facial Droop and Weakness GI bleed  Labs:  Sodium (mmol/L)  Date Value  07/10/2017 139   Potassium (mmol/L)  Date Value  07/10/2017 4.3   Magnesium (mg/dL)  Date Value  07/08/2017 2.2   Phosphorus (mg/dL)  Date Value  07/06/2017 3.8   Calcium (mg/dL)  Date Value  07/10/2017 8.5 (L)   Albumin (g/dL)  Date Value  07/06/2017 3.2 (L)    Assessment/Plan: K WNL at 4.3. No supplementation needed at this time Pt with d/c orders, will sign off  Rayna Sexton, PharmD, BCPS Clinical Pharmacist 07/10/2017 9:15 AM

## 2017-07-10 NOTE — Progress Notes (Signed)
Received verbal order from Dr. Anselm Jungling to not discharge patient. Patient is planned to be transferred to [facility] for the active GI bleed. Reinserted IV, spoke with patient's daughter as well.

## 2017-07-10 NOTE — Care Management (Addendum)
Discharge to home today per Dr. Anselm Jungling. Will be followed by [facility]. Will update Glennis Brink. No transportation. Will issue taxi voucher Discharge cancelled. Active bleed. Glennis Brink [facility] representative updated.  Shelbie Ammons RN MSN CCM Care Management [phone]

## 2017-07-10 NOTE — Progress Notes (Signed)
Capsule study disc placed in paper chart. Primary RN, Colletta Maryland made aware. [facility] transfer order in place. Awaiting transfer per notes. Madlyn Frankel, RN

## 2017-07-10 NOTE — Progress Notes (Signed)
Capsule study   Findings: 1. Multiple polyps, ulcers and areas with active bleeding seen extending from proximal small bowel to mid point of the study . There were multiple non bleeding AVM's in addition .   Discussed findings with Dr Allen Norris and Dr Marthann Schiller   Plan  1. Recommend inpatient transfer to [facility] or [facility] for anterograde balloon enteroscopy to control bleeding  2. In the interim can consider IV octreotide and monitor CBC 3. Full report will be scanned into Epic under---> procedures--->endoscopies  Dr Jonathon Bellows MD,MRCP [facility]) Gastroenterology/Hepatology Pager: [phone]

## 2017-07-11 DIAGNOSIS — Z955 Presence of coronary angioplasty implant and graft: Secondary | ICD-10-CM | POA: Diagnosis not present

## 2017-07-11 DIAGNOSIS — D509 Iron deficiency anemia, unspecified: Secondary | ICD-10-CM | POA: Diagnosis not present

## 2017-07-11 DIAGNOSIS — Z9049 Acquired absence of other specified parts of digestive tract: Secondary | ICD-10-CM | POA: Diagnosis not present

## 2017-07-11 DIAGNOSIS — I110 Hypertensive heart disease with heart failure: Secondary | ICD-10-CM | POA: Diagnosis present

## 2017-07-11 DIAGNOSIS — D500 Iron deficiency anemia secondary to blood loss (chronic): Secondary | ICD-10-CM | POA: Diagnosis present

## 2017-07-11 DIAGNOSIS — G40909 Epilepsy, unspecified, not intractable, without status epilepticus: Secondary | ICD-10-CM | POA: Diagnosis present

## 2017-07-11 DIAGNOSIS — K31811 Angiodysplasia of stomach and duodenum with bleeding: Secondary | ICD-10-CM | POA: Diagnosis not present

## 2017-07-11 DIAGNOSIS — Z794 Long term (current) use of insulin: Secondary | ICD-10-CM | POA: Diagnosis not present

## 2017-07-11 DIAGNOSIS — I482 Chronic atrial fibrillation: Secondary | ICD-10-CM | POA: Diagnosis present

## 2017-07-11 DIAGNOSIS — K3189 Other diseases of stomach and duodenum: Secondary | ICD-10-CM | POA: Diagnosis not present

## 2017-07-11 DIAGNOSIS — Z66 Do not resuscitate: Secondary | ICD-10-CM | POA: Diagnosis present

## 2017-07-11 DIAGNOSIS — K922 Gastrointestinal hemorrhage, unspecified: Secondary | ICD-10-CM | POA: Diagnosis not present

## 2017-07-11 DIAGNOSIS — D649 Anemia, unspecified: Secondary | ICD-10-CM | POA: Diagnosis not present

## 2017-07-11 DIAGNOSIS — K289 Gastrojejunal ulcer, unspecified as acute or chronic, without hemorrhage or perforation: Secondary | ICD-10-CM | POA: Diagnosis not present

## 2017-07-11 DIAGNOSIS — F329 Major depressive disorder, single episode, unspecified: Secondary | ICD-10-CM | POA: Diagnosis present

## 2017-07-11 DIAGNOSIS — I959 Hypotension, unspecified: Secondary | ICD-10-CM | POA: Diagnosis not present

## 2017-07-11 DIAGNOSIS — K6389 Other specified diseases of intestine: Secondary | ICD-10-CM | POA: Diagnosis not present

## 2017-07-11 DIAGNOSIS — I2510 Atherosclerotic heart disease of native coronary artery without angina pectoris: Secondary | ICD-10-CM | POA: Diagnosis present

## 2017-07-11 DIAGNOSIS — Z7984 Long term (current) use of oral hypoglycemic drugs: Secondary | ICD-10-CM | POA: Diagnosis not present

## 2017-07-11 DIAGNOSIS — K250 Acute gastric ulcer with hemorrhage: Secondary | ICD-10-CM | POA: Diagnosis not present

## 2017-07-11 DIAGNOSIS — F411 Generalized anxiety disorder: Secondary | ICD-10-CM | POA: Diagnosis present

## 2017-07-11 DIAGNOSIS — E7800 Pure hypercholesterolemia, unspecified: Secondary | ICD-10-CM | POA: Diagnosis present

## 2017-07-11 DIAGNOSIS — M810 Age-related osteoporosis without current pathological fracture: Secondary | ICD-10-CM | POA: Diagnosis present

## 2017-07-11 DIAGNOSIS — J449 Chronic obstructive pulmonary disease, unspecified: Secondary | ICD-10-CM | POA: Diagnosis present

## 2017-07-11 DIAGNOSIS — E119 Type 2 diabetes mellitus without complications: Secondary | ICD-10-CM | POA: Diagnosis not present

## 2017-07-11 DIAGNOSIS — E039 Hypothyroidism, unspecified: Secondary | ICD-10-CM | POA: Diagnosis present

## 2017-07-11 DIAGNOSIS — I509 Heart failure, unspecified: Secondary | ICD-10-CM | POA: Diagnosis present

## 2017-07-11 DIAGNOSIS — R569 Unspecified convulsions: Secondary | ICD-10-CM | POA: Diagnosis not present

## 2017-07-11 DIAGNOSIS — E1140 Type 2 diabetes mellitus with diabetic neuropathy, unspecified: Secondary | ICD-10-CM | POA: Diagnosis present

## 2017-07-11 DIAGNOSIS — I69354 Hemiplegia and hemiparesis following cerebral infarction affecting left non-dominant side: Secondary | ICD-10-CM | POA: Diagnosis not present

## 2017-07-11 DIAGNOSIS — K5520 Angiodysplasia of colon without hemorrhage: Secondary | ICD-10-CM | POA: Diagnosis not present

## 2017-07-11 DIAGNOSIS — I4891 Unspecified atrial fibrillation: Secondary | ICD-10-CM | POA: Diagnosis not present

## 2017-07-11 DIAGNOSIS — Z85828 Personal history of other malignant neoplasm of skin: Secondary | ICD-10-CM | POA: Diagnosis not present

## 2017-07-11 DIAGNOSIS — F17210 Nicotine dependence, cigarettes, uncomplicated: Secondary | ICD-10-CM | POA: Diagnosis present

## 2017-07-11 DIAGNOSIS — M1990 Unspecified osteoarthritis, unspecified site: Secondary | ICD-10-CM | POA: Diagnosis present

## 2017-07-11 DIAGNOSIS — Z7982 Long term (current) use of aspirin: Secondary | ICD-10-CM | POA: Diagnosis not present

## 2017-07-11 DIAGNOSIS — R531 Weakness: Secondary | ICD-10-CM | POA: Diagnosis not present

## 2017-07-11 DIAGNOSIS — R10819 Abdominal tenderness, unspecified site: Secondary | ICD-10-CM | POA: Diagnosis not present

## 2017-07-11 DIAGNOSIS — D62 Acute posthemorrhagic anemia: Secondary | ICD-10-CM | POA: Diagnosis not present

## 2017-07-11 LAB — GLUCOSE, CAPILLARY
GLUCOSE-CAPILLARY: 96 mg/dL (ref 70–99)
GLUCOSE-CAPILLARY: 136 mg/dL — AB (ref 70–99)
GLUCOSE-CAPILLARY: 87 mg/dL (ref 70–99)
Glucose-Capillary: 94 mg/dL (ref 70–99)

## 2017-07-11 LAB — CBC
HCT: 28.2 % — ABNORMAL LOW (ref 35.0–47.0)
Hemoglobin: 8.9 g/dL — ABNORMAL LOW (ref 12.0–16.0)
MCH: 23.5 pg — AB (ref 26.0–34.0)
MCHC: 31.6 g/dL — ABNORMAL LOW (ref 32.0–36.0)
MCV: 74.5 fL — ABNORMAL LOW (ref 80.0–100.0)
PLATELETS: 332 10*3/uL (ref 150–440)
RBC: 3.79 MIL/uL — AB (ref 3.80–5.20)
RDW: 25.8 % — ABNORMAL HIGH (ref 11.5–14.5)
WBC: 6.4 10*3/uL (ref 3.6–11.0)

## 2017-07-11 NOTE — Plan of Care (Signed)
  Problem: Education: Goal: Knowledge of General Education information will improve Outcome: Progressing   Problem: Health Behavior/Discharge Planning: Goal: Ability to manage health-related needs will improve Outcome: Progressing   Problem: Clinical Measurements: Goal: Ability to maintain clinical measurements within normal limits will improve Outcome: Progressing Goal: Will remain free from infection Outcome: Progressing Goal: Diagnostic test results will improve Outcome: Progressing Goal: Respiratory complications will improve Outcome: Progressing Goal: Cardiovascular complication will be avoided Outcome: Progressing   Problem: Activity: Goal: Risk for activity intolerance will decrease Outcome: Progressing   Problem: Nutrition: Goal: Adequate nutrition will be maintained Outcome: Progressing   Problem: Coping: Goal: Level of anxiety will decrease Outcome: Progressing   Problem: Elimination: Goal: Will not experience complications related to bowel motility Outcome: Progressing Goal: Will not experience complications related to urinary retention Outcome: Progressing   Problem: Pain Managment: Goal: General experience of comfort will improve Outcome: Progressing   Problem: Safety: Goal: Ability to remain free from injury will improve Outcome: Progressing   Problem: Skin Integrity: Goal: Risk for impaired skin integrity will decrease Outcome: Progressing   Problem: Education: Goal: Knowledge of disease or condition will improve Outcome: Progressing Goal: Knowledge of secondary prevention will improve Outcome: Progressing Goal: Knowledge of patient specific risk factors addressed and post discharge goals established will improve Outcome: Progressing   Problem: Coping: Goal: Will identify appropriate support needs Outcome: Progressing   Problem: Health Behavior/Discharge Planning: Goal: Ability to manage health-related needs will improve Outcome:  Progressing   Problem: Self-Care: Goal: Ability to participate in self-care as condition permits will improve Outcome: Progressing Goal: Ability to communicate needs accurately will improve Outcome: Progressing   Problem: Nutrition: Goal: Risk of aspiration will decrease Outcome: Progressing   Problem: Ischemic Stroke/TIA Tissue Perfusion: Goal: Complications of ischemic stroke/TIA will be minimized Outcome: Progressing

## 2017-07-11 NOTE — Progress Notes (Signed)
[facility] [place] NAME: Samantha Aguirre    MR#:  [id number]  DATE OF BIRTH:  11-Jul-1945  SUBJECTIVE:   Patient was any admitted to the hospital secondary to due to suspected stroke which has now been ruled out.  Now having a GI bleed secondary to bleeding AVMs in the small bowel.  Patient is on a transfer list to go to [facility] for push enteroscopy.  Hg. Stable. No acute events overnight.   REVIEW OF SYSTEMS:    Review of Systems  Constitutional: Negative for chills and fever.  HENT: Negative for congestion and tinnitus.   Eyes: Negative for blurred vision and double vision.  Respiratory: Negative for cough, shortness of breath and wheezing.   Cardiovascular: Negative for chest pain, orthopnea and PND.  Gastrointestinal: Negative for abdominal pain, diarrhea, nausea and vomiting.  Genitourinary: Negative for dysuria and hematuria.  Neurological: Negative for dizziness, sensory change and focal weakness.  All other systems reviewed and are negative.   Nutrition: Clear liquid Tolerating Diet: Yes Tolerating PT: Ambulatory      DRUG ALLERGIES:   Allergies  Allergen Reactions  . Mushroom Extract Complex Anaphylaxis    Made patient "deathly sick"  . Atenolol     "MD took me off of it bc it was doing something wrong" Made patient HYPOTENSIVE  . Ivp Dye [Iodinated Diagnostic Agents] Itching    Pt injected with IV contrast.  5 min after injection pt complained of itching behind ear and on her abd.  . Morphine And Related Other (See Comments)    "stops my breathing" NEAR-RESPIRATORY ARREST  . Percocet [Oxycodone-Acetaminophen] Nausea And Vomiting and Other (See Comments)    Stomach pains  . Percodan [Oxycodone-Aspirin] Nausea And Vomiting and Other (See Comments)    Stomach pains  . Verapamil Other (See Comments)    " MD told me this was screwing me up" MAKES PATIENT HYPOTENSIVE    VITALS:  Blood pressure 120/60, pulse (!) 55,  temperature 97.6 F (36.4 C), temperature source Oral, resp. rate 16, height 5\' 1"  (1.549 m), weight 82.4 kg (181 lb 11.2 oz), SpO2 97 %.  PHYSICAL EXAMINATION:   Physical Exam  GENERAL:  72 y.o.-year-old patient lying in bed in no acute distress.  EYES: Pupils equal, round, reactive to light and accommodation. No scleral icterus. Extraocular muscles intact.  HEENT: Head atraumatic, normocephalic. Oropharynx and nasopharynx clear.  NECK:  Supple, no jugular venous distention. No thyroid enlargement, no tenderness.  LUNGS: Normal breath sounds bilaterally, no wheezing, rales, rhonchi. No use of accessory muscles of respiration.  CARDIOVASCULAR: S1, S2 normal. No murmurs, rubs, or gallops.  ABDOMEN: Soft, nontender, nondistended. Bowel sounds present. No organomegaly or mass.  EXTREMITIES: No cyanosis, clubbing or edema b/l.    NEUROLOGIC: Cranial nerves II through XII are intact. No focal Motor or sensory deficits b/l.   PSYCHIATRIC: The patient is alert and oriented x 3.  SKIN: No obvious rash, lesion, or ulcer.   LABORATORY PANEL:   CBC Recent Labs  Lab 07/11/17 0437  WBC 6.4  HGB 8.9*  HCT 28.2*  PLT 332   ------------------------------------------------------------------------------------------------------------------  Chemistries  Recent Labs  Lab 07/06/17 0936  07/08/17 0753 07/10/17 0800  NA 140   < > 140 139  K 2.7*   < > 4.7 4.3  CL 105   < > 109 108  CO2 23   < > 25 24  GLUCOSE 156*   < > 141* 110*  BUN 11   < > 9 11  CREATININE 0.42*   < > 0.49 0.46  CALCIUM 8.1*   < > 8.7* 8.5*  MG  --    < > 2.2  --   AST 20  --   --   --   ALT 10*  --   --   --   ALKPHOS 88  --   --   --   BILITOT 0.5  --   --   --    < > = values in this interval not displayed.   ------------------------------------------------------------------------------------------------------------------  Cardiac Enzymes Recent Labs  Lab 07/06/17 0936  TROPONINI <0.03    ------------------------------------------------------------------------------------------------------------------  RADIOLOGY:  No results found.   ASSESSMENT AND PLAN:   72 year old female with past medical history of COPD, coronary artery disease, atrial fibrillation, history of seizures, history of previous CVA, diabetes, CHF who presented to the hospital due to left arm weakness and numbness and suspected to have a CVA/TIA.  Her hospital course has been complicated with a GI bleed.  1.  GI bleed- patient is noted to have a GI bleed secondary to angiodysplasias and AVMs which were acutely bleeding on the capsule endoscopy. - She is hemoglobin on July 06, 2017 was as low as 6.7.  She has been transfused hemoglobin stable at 8.9 today. - She had a recent colonoscopy and endoscopy which were negative and therefore underwent a capsule endoscopy which was positive as mentioned above. - Continue supportive care with clear liquid diet and transfuse as needed.  Seen by gastroenterology and patient needs a push enteroscopy for further intervention for her GI bleed and therefore is on a transfer list to go to [facility].  2.  Microcytic anemia-this is acute blood loss anemia secondary to the GI bleed as mentioned above. -Follow hemoglobin.  Hold Eliquis.  Transfuse if hemoglobin drops less than 7.  3.  Left upper and lower extremity weakness with facial droop- patient initially presented to the hospital with these complaints and was thought to have a CVA/TIA. - Patient does have a previous stroke in the past.  Patient underwent an MRI of the brain which was negative for acute pathology.  Patient's carotid duplex in March 2019 showed no acute hemodynamically significant carotid artery stenosis. -Seen by neurology-continue aspirin, holding Eliquis given the GI bleed as mentioned above.  4.  History of seizures-no acute seizures presently.  Continue Keppra.  5.  Hypothyroidism- continue Synthroid.  6.  Hyperlipidemia-continue atorvastatin.  7.  Diabetes type 2 without complication-continue glipizide, sliding scale insulin.  8.  Essential hypertension-continue lisinopril  9.  Depression-continue Cymbalta.  10.  Neuropathy-continue gabapentin.  Patient is on a transfer list to go to [facility].  Called the transfer center today and they do not think the patient will get the bed today.      All the records are reviewed and case discussed with Care Management/Social Worker. Management plans discussed with the patient, family and they are in agreement.  CODE STATUS: DNR  DVT Prophylaxis: Ted's & SCD's  TOTAL TIME TAKING CARE OF THIS PATIENT: 30 minutes.   POSSIBLE D/C IN 3-4 DAYS, DEPENDING ON CLINICAL CONDITION.   Henreitta Leber M.D on 07/11/2017 at 11:24 AM  Between 7am to 6pm - Pager - [phone]  After 6pm go to [email or web address] - Proofreader  [facility]  Office  [phone]  CC: Primary care physician; Tracie Harrier, MD

## 2017-07-11 NOTE — Plan of Care (Signed)
Patient sleeping comfortably. Not in any form of distress. No bowel movement thus far this shift. Kept closely monitored for GI bleed. Needs attended. Awaiting for transfer to [facility] for GI procedure to treat GI bleed. Will continue to monitor.

## 2017-07-11 NOTE — Progress Notes (Signed)
Physical Therapy Treatment Patient Details Name: Samantha Aguirre MRN: [id number] DOB: 04/27/1945 Today's Date: 07/11/2017    History of Present Illness Pt is a 72 y.o. female with a known history of diastolic heart failure, bronchial asthma, atrial fibrillation, COPD, hyperlipidemia, diabetes mellitus, seizure disorder, skin cancer and recurrent CVA.  Here with some new onset L sided weakness, CT and MRI negative for acute CVA.    PT Comments    OK from RN to treat.  Pt in bed, ready to get up.  Was able to walk to bathroom then around unit with walker and supervision.  Pt with decreased step height and length but could be attributed to foot wear as she was wearing bedroom slippers.  No LOB or buckling noted with good safety.  Has been walking with nursing staff on unit.     Follow Up Recommendations  Home health PT     Equipment Recommendations       Recommendations for Other Services       Precautions / Restrictions Precautions Precautions: Fall Restrictions Weight Bearing Restrictions: No    Mobility  Bed Mobility Overal bed mobility: Independent                Transfers Overall transfer level: Independent Equipment used: Rolling walker (2 wheeled)                Ambulation/Gait Ambulation/Gait assistance: Supervision Gait Distance (Feet): 200 Feet Assistive device: Rolling walker (2 wheeled)           Stairs             Wheelchair Mobility    Modified Rankin (Stroke Patients Only)       Balance Overall balance assessment: Modified Independent;History of Falls                                          Cognition Arousal/Alertness: Awake/alert Behavior During Therapy: WFL for tasks assessed/performed Overall Cognitive Status: Within Functional Limits for tasks assessed                                        Exercises      General Comments        Pertinent Vitals/Pain Pain Assessment:  No/denies pain    Home Living                      Prior Function            PT Goals (current goals can now be found in the care plan section) Progress towards PT goals: Progressing toward goals    Frequency    Min 2X/week      PT Plan Current plan remains appropriate    Co-evaluation              AM-PAC PT "6 Clicks" Daily Activity  Outcome Measure  Difficulty turning over in bed (including adjusting bedclothes, sheets and blankets)?: None Difficulty moving from lying on back to sitting on the side of the bed? : None Difficulty sitting down on and standing up from a chair with arms (e.g., wheelchair, bedside commode, etc,.)?: None Help needed moving to and from a bed to chair (including a wheelchair)?: None Help needed walking in hospital room?: None Help needed climbing 3-5 steps with a  railing? : A Little 6 Click Score: 23    End of Session Equipment Utilized During Treatment: Gait belt Activity Tolerance: Patient tolerated treatment well Patient left: in chair;with chair alarm set;with nursing/sitter in room;with call bell/phone within reach         Time: 1215-1224 PT Time Calculation (min) (ACUTE ONLY): 9 min  Charges:  $Gait Training: 8-22 mins                    G Codes:       Chesley Noon, PTA 07/11/17, 12:49 PM

## 2017-07-13 MED ORDER — TIOTROPIUM BROMIDE MONOHYDRATE 18 MCG IN CAPS
18.00 | ORAL_CAPSULE | RESPIRATORY_TRACT | Status: DC
Start: 2017-07-15 — End: 2017-07-13

## 2017-07-13 MED ORDER — SUCRALFATE 1 G PO TABS
1.00 | ORAL_TABLET | ORAL | Status: DC
Start: 2017-07-14 — End: 2017-07-13

## 2017-07-13 MED ORDER — ACETAMINOPHEN 325 MG PO TABS
650.00 | ORAL_TABLET | ORAL | Status: DC
Start: ? — End: 2017-07-13

## 2017-07-13 MED ORDER — DICLOFENAC SODIUM 1 % TD GEL
2.00 | TRANSDERMAL | Status: DC
Start: 2017-07-14 — End: 2017-07-13

## 2017-07-13 MED ORDER — ATORVASTATIN CALCIUM 80 MG PO TABS
80.00 | ORAL_TABLET | ORAL | Status: DC
Start: 2017-07-15 — End: 2017-07-13

## 2017-07-13 MED ORDER — LACTATED RINGERS IV SOLN
10.00 | INTRAVENOUS | Status: DC
Start: ? — End: 2017-07-13

## 2017-07-13 MED ORDER — FUROSEMIDE 20 MG PO TABS
10.00 | ORAL_TABLET | ORAL | Status: DC
Start: 2017-07-15 — End: 2017-07-13

## 2017-07-13 MED ORDER — LEVOTHYROXINE SODIUM 125 MCG PO TABS
125.00 | ORAL_TABLET | ORAL | Status: DC
Start: 2017-07-15 — End: 2017-07-13

## 2017-07-13 MED ORDER — FLUTICASONE PROPIONATE 50 MCG/ACT NA SUSP
1.00 | NASAL | Status: DC
Start: 2017-07-15 — End: 2017-07-13

## 2017-07-13 MED ORDER — FERROUS SULFATE 325 (65 FE) MG PO TABS
325.00 | ORAL_TABLET | ORAL | Status: DC
Start: 2017-07-14 — End: 2017-07-13

## 2017-07-13 MED ORDER — LEVETIRACETAM 500 MG PO TABS
500.00 | ORAL_TABLET | ORAL | Status: DC
Start: 2017-07-14 — End: 2017-07-13

## 2017-07-13 MED ORDER — DULOXETINE HCL 60 MG PO CPEP
60.00 | ORAL_CAPSULE | ORAL | Status: DC
Start: 2017-07-14 — End: 2017-07-13

## 2017-07-13 MED ORDER — INSULIN LISPRO 100 UNIT/ML ~~LOC~~ SOLN
1.00 | SUBCUTANEOUS | Status: DC
Start: 2017-07-14 — End: 2017-07-13

## 2017-07-13 MED ORDER — GABAPENTIN 400 MG PO CAPS
800.00 | ORAL_CAPSULE | ORAL | Status: DC
Start: 2017-07-15 — End: 2017-07-13

## 2017-07-13 MED ORDER — PANTOPRAZOLE SODIUM 40 MG PO TBEC
40.00 | DELAYED_RELEASE_TABLET | ORAL | Status: DC
Start: 2017-07-15 — End: 2017-07-13

## 2017-07-15 ENCOUNTER — Emergency Department
Admission: EM | Admit: 2017-07-15 | Discharge: 2017-07-15 | Disposition: A | Discharge status: Home / Self Care | Payer: Medicare Other | Attending: Emergency Medicine | Admitting: Emergency Medicine

## 2017-07-15 ENCOUNTER — Other Ambulatory Visit: Payer: Self-pay

## 2017-07-15 ENCOUNTER — Emergency Department: Payer: Medicare Other

## 2017-07-15 DIAGNOSIS — R109 Unspecified abdominal pain: Secondary | ICD-10-CM | POA: Diagnosis not present

## 2017-07-15 DIAGNOSIS — Z79899 Other long term (current) drug therapy: Secondary | ICD-10-CM | POA: Diagnosis not present

## 2017-07-15 DIAGNOSIS — I110 Hypertensive heart disease with heart failure: Secondary | ICD-10-CM | POA: Insufficient documentation

## 2017-07-15 DIAGNOSIS — I5032 Chronic diastolic (congestive) heart failure: Secondary | ICD-10-CM | POA: Diagnosis not present

## 2017-07-15 DIAGNOSIS — F17210 Nicotine dependence, cigarettes, uncomplicated: Secondary | ICD-10-CM | POA: Insufficient documentation

## 2017-07-15 DIAGNOSIS — Z7984 Long term (current) use of oral hypoglycemic drugs: Secondary | ICD-10-CM | POA: Insufficient documentation

## 2017-07-15 DIAGNOSIS — K2971 Gastritis, unspecified, with bleeding: Secondary | ICD-10-CM | POA: Diagnosis not present

## 2017-07-15 DIAGNOSIS — J449 Chronic obstructive pulmonary disease, unspecified: Secondary | ICD-10-CM | POA: Insufficient documentation

## 2017-07-15 DIAGNOSIS — R531 Weakness: Secondary | ICD-10-CM | POA: Insufficient documentation

## 2017-07-15 DIAGNOSIS — Z7982 Long term (current) use of aspirin: Secondary | ICD-10-CM | POA: Diagnosis not present

## 2017-07-15 DIAGNOSIS — K921 Melena: Secondary | ICD-10-CM | POA: Diagnosis present

## 2017-07-15 DIAGNOSIS — I10 Essential (primary) hypertension: Secondary | ICD-10-CM | POA: Diagnosis not present

## 2017-07-15 DIAGNOSIS — E1140 Type 2 diabetes mellitus with diabetic neuropathy, unspecified: Secondary | ICD-10-CM | POA: Diagnosis not present

## 2017-07-15 DIAGNOSIS — K625 Hemorrhage of anus and rectum: Secondary | ICD-10-CM | POA: Diagnosis not present

## 2017-07-15 DIAGNOSIS — I69354 Hemiplegia and hemiparesis following cerebral infarction affecting left non-dominant side: Secondary | ICD-10-CM | POA: Diagnosis not present

## 2017-07-15 DIAGNOSIS — I2510 Atherosclerotic heart disease of native coronary artery without angina pectoris: Secondary | ICD-10-CM | POA: Diagnosis not present

## 2017-07-15 DIAGNOSIS — K264 Chronic or unspecified duodenal ulcer with hemorrhage: Secondary | ICD-10-CM | POA: Diagnosis not present

## 2017-07-15 LAB — CBC WITH DIFFERENTIAL/PLATELET
BASOS ABS: 0.1 10*3/uL (ref 0–0.1)
Basophils Relative: 1 %
EOS PCT: 1 %
Eosinophils Absolute: 0.1 10*3/uL (ref 0–0.7)
HCT: 28.6 % — ABNORMAL LOW (ref 35.0–47.0)
Hemoglobin: 9.1 g/dL — ABNORMAL LOW (ref 12.0–16.0)
Lymphocytes Relative: 21 %
Lymphs Abs: 2 10*3/uL (ref 1.0–3.6)
MCH: 23.7 pg — ABNORMAL LOW (ref 26.0–34.0)
MCHC: 31.7 g/dL — ABNORMAL LOW (ref 32.0–36.0)
MCV: 74.9 fL — ABNORMAL LOW (ref 80.0–100.0)
Monocytes Absolute: 0.7 10*3/uL (ref 0.2–0.9)
Monocytes Relative: 7 %
Neutro Abs: 6.5 10*3/uL (ref 1.4–6.5)
Neutrophils Relative %: 70 %
PLATELETS: 396 10*3/uL (ref 150–440)
RBC: 3.82 MIL/uL (ref 3.80–5.20)
RDW: 25.2 % — ABNORMAL HIGH (ref 11.5–14.5)
WBC: 9.4 10*3/uL (ref 3.6–11.0)

## 2017-07-15 LAB — PROTIME-INR
INR: 0.97
Prothrombin Time: 12.8 seconds (ref 11.4–15.2)

## 2017-07-15 LAB — COMPREHENSIVE METABOLIC PANEL
ALT: 12 U/L (ref 0–44)
AST: 21 U/L (ref 15–41)
Albumin: 3.7 g/dL (ref 3.5–5.0)
Alkaline Phosphatase: 107 U/L (ref 38–126)
Anion gap: 7 (ref 5–15)
BUN: 11 mg/dL (ref 8–23)
CHLORIDE: 112 mmol/L — AB (ref 98–111)
CO2: 23 mmol/L (ref 22–32)
CREATININE: 1.02 mg/dL — AB (ref 0.44–1.00)
Calcium: 9 mg/dL (ref 8.9–10.3)
GFR calc Af Amer: >60 mL/min (ref >60)
GFR calc non Af Amer: 53 mL/min — ABNORMAL LOW (ref >60)
GLUCOSE: 180 mg/dL — AB (ref 70–99)
Potassium: 3.2 mmol/L — ABNORMAL LOW (ref 3.5–5.1)
SODIUM: 142 mmol/L (ref 135–145)
Total Bilirubin: 0.6 mg/dL (ref 0.3–1.2)
Total Protein: 6.6 g/dL (ref 6.5–8.1)

## 2017-07-15 LAB — TROPONIN I: Troponin I: <0.03 ng/mL (ref <0.03)

## 2017-07-15 MED ORDER — PHENOL 1.4 % MT LIQD
2.00 | OROMUCOSAL | Status: DC
Start: ? — End: 2017-07-15

## 2017-07-15 MED ORDER — SODIUM CHLORIDE 0.9 % IV BOLUS
1000.0000 mL | Freq: Once | INTRAVENOUS | Status: AC
  Administered 2017-07-15: 1000 mL via INTRAVENOUS

## 2017-07-15 NOTE — ED Provider Notes (Signed)
[facility] Emergency Department Provider Note  Time seen: 4:43 PM  I have reviewed the triage vital signs and the nursing notes.   HISTORY  Chief Complaint Melena    HPI Samantha Aguirre is a 72 y.o. female with a past medical history of asthma, atrial fibrillation, CHF, COPD, CAD, diabetes, hypertension, hyperlipidemia, previous MI, previous CVA, recent GI bleed with angiodysplasia of the stomach and duodenum, recent endoscopy/colonoscopy 3 days ago presents to the emergency department for reported low blood pressure at home.  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  EMS states that her initial blood pressure is 270 systolic which improved to 623 systolic upon arrival to the emergency department.  Patient states she is continued to have a small amount of dark stool, but that has been ongoing since her colonoscopy and endoscopy.  Also states she is experiencing fairly diffuse but mostly left-sided abdominal discomfort which she describes as a cramping sensation 7/10.   Past Medical History:  Diagnosis Date  . Acid reflux   . Arthritis    Bilateral knees, hands, feet  . Asthma   . Atrial fibrillation ([place])   . Cancer ([place])   . Chest pain   . CHF (congestive heart failure) (HCC)    Diastolic CHF  . COPD (chronic obstructive pulmonary disease) ([place])   . Coronary artery disease   . Diabetes mellitus without complication ([place])   . Frequent headaches   . Heart attack ([place])   . High cholesterol   . HLD (hyperlipidemia)   . Hypertension   . MI (myocardial infarction) ([place])   . Seizures ([place])   . Skin cancer 2015   Suspected basal cell carcinoma on nose, surgically removed  . Stroke ([place])   . Thyroid disease   . Tremors of nervous system     Patient Active Problem List   Diagnosis Date Noted  . GI bleed 07/08/2017  .  Anemia 07/08/2017  . Angiodysplasia of stomach and duodenum with hemorrhage   . Stroke (cerebrum) ([place])   . Goals of care, counseling/discussion   . Palliative care encounter   . COPD exacerbation ([place]) 04/03/2017  . Flu 03/27/2017  . Iron deficiency anemia due to chronic blood loss   . Migraine 02/13/2017  . Cataracts, bilateral 01/15/2017  . Chronic venous insufficiency 10/06/2016  . Recurrent major depressive disorder, in partial remission ([place]) 10/06/2016  . Left-sided weakness 10/03/2016  . Chest pain 02/27/2016  . History of cerebrovascular accident (CVA) with residual deficit 02/19/2016  . Thumb pain, left 01/22/2016  . Traumatic closed nondisplaced fracture of base of metacarpal bone of left thumb 01/22/2016  . Colon cancer screening 11/27/2015  . Depression 11/14/2015  . Coronary artery disease 11/13/2015  . Hypertension 11/13/2015  . History of skin cancer 11/13/2015  . Osteoporosis 11/13/2015  . Chronic low back pain 11/13/2015  . Left medial knee pain 11/13/2015  . Osteoarthritis of multiple joints 11/13/2015  . Other specified hypothyroidism 08/16/2015  . Controlled type 2 diabetes mellitus with diabetic neuropathy ([place]) 08/16/2015  . COPD (chronic obstructive pulmonary disease) ([place]) 08/16/2015  . Tobacco abuse 08/16/2015  . HLD (hyperlipidemia) 08/16/2015  . History of CHF (congestive heart failure) 08/16/2015  . Seizures ([place])     Past Surgical History:  Procedure Laterality Date  . ABDOMINAL HYSTERECTOMY  1976  . APPENDECTOMY  1980  . CAROTID ENDARTERECTOMY    . CHOLECYSTECTOMY  1980  . COLONOSCOPY N/A 02/20/2017   Procedure: COLONOSCOPY;  Surgeon: Lin Landsman, MD;  Location: [facility] ENDOSCOPY;  Service: Gastroenterology;  Laterality: N/A;  . ESOPHAGOGASTRODUODENOSCOPY N/A 02/20/2017   Procedure: ESOPHAGOGASTRODUODENOSCOPY (EGD);  Surgeon: Lin Landsman, MD;  Location: [facility] ENDOSCOPY;  Service: Gastroenterology;  Laterality: N/A;  .  ESOPHAGOGASTRODUODENOSCOPY (EGD) WITH PROPOFOL N/A 07/08/2017   Procedure: ESOPHAGOGASTRODUODENOSCOPY (EGD) WITH PROPOFOL;  Surgeon: [name], MD;  Location: [facility] ENDOSCOPY;  Service: Endoscopy;  Laterality: N/A;  . [place]  . GIVENS CAPSULE STUDY N/A 07/09/2017   Procedure: GIVENS CAPSULE STUDY;  Surgeon: Jonathon Bellows, MD;  Location: [facility] ENDOSCOPY;  Service: Gastroenterology;  Laterality: N/A;  . KNEE SURGERY     left  . ROTATOR CUFF REPAIR     right  . TONSILLECTOMY  1950    Prior to Admission medications   Medication Sig Start Date End Date Taking? Authorizing Provider  albuterol (PROVENTIL HFA;VENTOLIN HFA) 108 (90 Base) MCG/ACT inhaler Inhale 2 puffs into the lungs every 6 (six) hours as needed for wheezing or shortness of breath. 10/08/16   Karamalegos, Devonne Doughty, DO  aspirin EC 81 MG tablet Take 1 tablet (81 mg total) by mouth daily. 07/10/17 07/10/18  Vaughan Basta, MD  atorvastatin (LIPITOR) 80 MG tablet Take 1 tablet (80 mg total) by mouth daily at 6 PM. 09/24/16   Karamalegos, Devonne Doughty, DO  diclofenac sodium (VOLTAREN) 1 % GEL Apply 2 g topically 4 (four) times daily. 01/11/16   Daymon Larsen, MD  DULoxetine (CYMBALTA) 60 MG capsule Take 1 capsule (60 mg total) by mouth at bedtime. 09/24/16   Karamalegos, Devonne Doughty, DO  ferrous sulfate 325 (65 FE) MG EC tablet Take 1 tablet (325 mg total) by mouth 2 (two) times daily with a meal. 02/21/17   Sudini, Srikar, MD  fluticasone (FLONASE) 50 MCG/ACT nasal spray Place 1 spray into both nostrils daily. 10/08/16   Karamalegos, Devonne Doughty, DO  furosemide (LASIX) 20 MG tablet Take 10 mg by mouth daily.     Karamalegos, Alexander J, DO  gabapentin (NEURONTIN) 800 MG tablet Take 1 tablet (800 mg total) by mouth 2 (two) times daily. 09/24/16   Karamalegos, Devonne Doughty, DO  glipiZIDE-metformin (METAGLIP) 5-500 MG tablet Take 1 tablet by mouth 2 (two) times daily before a meal. 04/02/17   Karamalegos, Devonne Doughty, DO   levETIRAcetam (KEPPRA) 500 MG tablet Take 1 tablet (500 mg total) by mouth 2 (two) times daily. 09/19/15   Epifanio Lesches, MD  levothyroxine (SYNTHROID, LEVOTHROID) 175 MCG tablet Take 1 tablet (175 mcg total) by mouth daily before breakfast. Patient taking differently: Take 112 mcg by mouth daily before breakfast.  09/24/16   Karamalegos, Devonne Doughty, DO  nitroGLYCERIN (NITROSTAT) 0.4 MG SL tablet Place 1 tablet (0.4 mg total) under the tongue every 5 (five) minutes as needed for chest pain. 02/28/16   Bettey Costa, MD  omeprazole (PRILOSEC) 20 MG capsule Take 20 mg by mouth 2 (two) times daily.    [provider]  potassium chloride (MICRO-K) 10 MEQ CR capsule Take 1 capsule (10 mEq total) by mouth daily. Patient taking differently: Take 10 mEq by mouth 2 (two) times daily.  09/24/16   Karamalegos, Devonne Doughty, DO  sucralfate (CARAFATE) 1 g tablet TAKE (1) TABLET BY MOUTH FOUR TIMES A DAY 03/26/17   Karamalegos, Devonne Doughty, DO  umeclidinium bromide (INCRUSE ELLIPTA) 62.5 MCG/INH AEPB Inhale 1  puff into the lungs daily. 11/13/15   Karamalegos, Devonne Doughty, DO  vitamin B-12 (CYANOCOBALAMIN) 1000 MCG tablet Take 1 tablet by mouth daily. 06/29/17   [provider]    Allergies  Allergen Reactions  . Mushroom Extract Complex Anaphylaxis    Made patient "deathly sick"  . Atenolol     "MD took me off of it bc it was doing something wrong" Made patient HYPOTENSIVE  . Ivp Dye [Iodinated Diagnostic Agents] Itching    Pt injected with IV contrast.  5 min after injection pt complained of itching behind ear and on her abd.  . Morphine And Related Other (See Comments)    "stops my breathing" NEAR-RESPIRATORY ARREST  . Percocet [Oxycodone-Acetaminophen] Nausea And Vomiting and Other (See Comments)    Stomach pains  . Percodan [Oxycodone-Aspirin] Nausea And Vomiting and Other (See Comments)    Stomach pains  . Verapamil Other (See Comments)    " MD told me this was screwing me  up" MAKES PATIENT HYPOTENSIVE    Family History  Problem Relation Age of Onset  . Breast cancer Mother   . Heart disease Mother   . Stroke Mother   . Cancer Mother   . COPD Mother   . Diabetes Mother   . Heart disease Father   . Diabetes Father   . Stroke Father   . Alcohol abuse Sister   . Drug abuse Sister   . Stroke Sister   . Cancer Sister   . Mental illness Sister   . Heart disease Brother   . Arthritis Brother   . Diabetes Brother   . Heart disease Maternal Grandfather   . Heart disease Paternal Grandfather     Social History Social History   Tobacco Use  . Smoking status: Current Every Day Smoker    Packs/day: 1.00    Years: 50.00    Pack years: 50.00    Types: Cigarettes  . Smokeless tobacco: Never Used  Substance Use Topics  . Alcohol use: No  . Drug use: No    Review of Systems Constitutional: Negative for fever. Cardiovascular: Negative for chest pain. Respiratory: Negative for shortness of breath. Gastrointestinal: Moderate diffuse abdominal pain which the patient states just started upon arrival to the emergency department describes as a 7/10 cramping especially in the left side.  States mild dark stool but that has been ongoing for the past 3 days since her colonoscopy/endoscopy. Genitourinary: Negative for urinary compaints  Musculoskeletal: Negative for musculoskeletal complaints Skin: Negative for skin complaints  Neurological: Negative for headache All other ROS negative  ____________________________________________   PHYSICAL EXAM:  Constitutional: Alert and oriented. Well appearing and in no distress. Eyes: Normal exam ENT   Head: Normocephalic and atraumatic.   Mouth/Throat: Mucous membranes are moist. Cardiovascular: Normal rate, regular rhythm. No murmur Respiratory: Normal respiratory effort without tachypnea nor retractions. Breath sounds are clear  Gastrointestinal: Soft, patient does state mild discomfort when pressing  on the left upper and lower quadrants.  No rebound or guarding, no distention.  While the patient is distracted during palpation she makes no reaction to left-sided palpation. Musculoskeletal: Nontender with normal range of motion in all extremities.  Neurologic: States some difficulty with speech, states this is chronic due to her stroke.  No other gross neurologic deficits  Skin:  Skin is warm, dry and intact.  Psychiatric: Mood and affect are normal.   ____________________________________________    EKG  EKG reviewed and interpreted by myself shows normal sinus rhythm  82 bpm with a narrow QRS, normal axis, normal intervals besides slight QTC prolongation, nonspecific ST changes.  ____________________________________________    RADIOLOGY   IMPRESSION: No acute finding by CT. No sign of bowel obstruction, bowel mass or bowel inflammation. Ordinary uncomplicated diverticulosis of the left colon.  Aortic atherosclerosis.  Previous cholecystectomy and hysterectomy.  Old L1 fracture. Lower lumbar degenerative changes.   Electronically Signed By: Nelson Chimes M.D. On: 07/15/2017 17:20  ____________________________________________   INITIAL IMPRESSION / ASSESSMENT AND PLAN / ED COURSE  Pertinent labs & imaging results that were available during my care of the patient were reviewed by me and considered in my medical decision making (see chart for details).  She presents the emergency department with reported low blood pressure at home, left-sided abdominal pain/cramping and continued dark stool although much improved per patient.  Differential would include continued GI bleed, resolving GI bleed, vasovagal event, dehydration, symptomatic anemia.  We will check labs, IV hydrate, obtain CT imaging of the abdomen and continue to closely monitor.  Patient agreeable to plan of care.  His labs are reassuring, hemoglobin is increased from recent blood work.  CT scan is negative.   Rectal examination shows light brown stool it is mildly guaiac positive however with her recent GI bleed 3 days ago this is not unexpected.  Given the increasing H&H with normal vitals I believe the patient is safe for discharge home with GI follow-up.  Patient agreeable to this plan of care. No abdominal pain currently.   ____________________________________________   FINAL CLINICAL IMPRESSION(S) / ED DIAGNOSES  Abdominal pain Hypotension, resolved    Harvest Dark, MD 07/15/17 [phone]

## 2017-07-15 NOTE — ED Triage Notes (Signed)
Pt comes from home via [facility] with c/o of black stools. Per EMS pt recently had procedure yesterday and was discharged. Pt also reports BP-80/49 per home health nurse. EMS reports manually BP-116/56, and retaken later to be 154/88, HR-97, 98% room air. Pt also states some cramping.

## 2017-07-17 ENCOUNTER — Other Ambulatory Visit: Payer: Self-pay | Admitting: *Deleted

## 2017-07-17 DIAGNOSIS — I110 Hypertensive heart disease with heart failure: Secondary | ICD-10-CM | POA: Diagnosis not present

## 2017-07-17 DIAGNOSIS — K264 Chronic or unspecified duodenal ulcer with hemorrhage: Secondary | ICD-10-CM | POA: Diagnosis not present

## 2017-07-17 DIAGNOSIS — I69354 Hemiplegia and hemiparesis following cerebral infarction affecting left non-dominant side: Secondary | ICD-10-CM | POA: Diagnosis not present

## 2017-07-17 DIAGNOSIS — I5032 Chronic diastolic (congestive) heart failure: Secondary | ICD-10-CM | POA: Diagnosis not present

## 2017-07-17 DIAGNOSIS — E1140 Type 2 diabetes mellitus with diabetic neuropathy, unspecified: Secondary | ICD-10-CM | POA: Diagnosis not present

## 2017-07-17 DIAGNOSIS — I2510 Atherosclerotic heart disease of native coronary artery without angina pectoris: Secondary | ICD-10-CM | POA: Diagnosis not present

## 2017-07-17 NOTE — Patient Outreach (Signed)
[facility]) Care Management  [place]  07/17/2017   Samantha Aguirre 21-Jan-1945 [id number]    Telephone assessment call    [facility] care management following patient following recent stay at rehab following hospitalization :  March 22-27,2019 for COPD, Stroke (acute CVA)with right side weakness Recent hospital admission to [facility] on 6/24-6/29 with GI bleed , Anemia, EGD, Angiodysplasia and duodenum with hemorrhage. Capsule study  Patient transferred to [facility] on 6/29-7/2  for procedure Small intestine endo .  Patient with ED visit at [facility] on 7/3 with complaint of weakness, Melena . PMHx includes but not limited to diabetes, CAD, chronic atrial fib,low back pain, hypertension, GERD, MI .    Subjective:   Unsuccessful outreach call to patient,not accepting message because voice mail box not set up.   Plan Will await return call , if no response Will send unsuccessful outreach letter  to patient,and plan return call in the next 3-4 business days.    [name], RN, [place] Management Coordinator  [phone]- Mobile [phone]- Toll Free Main Office

## 2017-07-20 ENCOUNTER — Ambulatory Visit: Payer: Self-pay | Admitting: *Deleted

## 2017-07-20 DIAGNOSIS — K264 Chronic or unspecified duodenal ulcer with hemorrhage: Secondary | ICD-10-CM | POA: Diagnosis not present

## 2017-07-20 DIAGNOSIS — I5032 Chronic diastolic (congestive) heart failure: Secondary | ICD-10-CM | POA: Diagnosis not present

## 2017-07-20 DIAGNOSIS — I110 Hypertensive heart disease with heart failure: Secondary | ICD-10-CM | POA: Diagnosis not present

## 2017-07-20 DIAGNOSIS — E1140 Type 2 diabetes mellitus with diabetic neuropathy, unspecified: Secondary | ICD-10-CM | POA: Diagnosis not present

## 2017-07-20 DIAGNOSIS — I69354 Hemiplegia and hemiparesis following cerebral infarction affecting left non-dominant side: Secondary | ICD-10-CM | POA: Diagnosis not present

## 2017-07-20 DIAGNOSIS — I2510 Atherosclerotic heart disease of native coronary artery without angina pectoris: Secondary | ICD-10-CM | POA: Diagnosis not present

## 2017-07-21 ENCOUNTER — Other Ambulatory Visit: Payer: Self-pay | Admitting: *Deleted

## 2017-07-21 DIAGNOSIS — E1140 Type 2 diabetes mellitus with diabetic neuropathy, unspecified: Secondary | ICD-10-CM | POA: Diagnosis not present

## 2017-07-21 DIAGNOSIS — I2510 Atherosclerotic heart disease of native coronary artery without angina pectoris: Secondary | ICD-10-CM | POA: Diagnosis not present

## 2017-07-21 DIAGNOSIS — I110 Hypertensive heart disease with heart failure: Secondary | ICD-10-CM | POA: Diagnosis not present

## 2017-07-21 DIAGNOSIS — I5032 Chronic diastolic (congestive) heart failure: Secondary | ICD-10-CM | POA: Diagnosis not present

## 2017-07-21 DIAGNOSIS — I69354 Hemiplegia and hemiparesis following cerebral infarction affecting left non-dominant side: Secondary | ICD-10-CM | POA: Diagnosis not present

## 2017-07-21 DIAGNOSIS — K264 Chronic or unspecified duodenal ulcer with hemorrhage: Secondary | ICD-10-CM | POA: Diagnosis not present

## 2017-07-21 NOTE — Patient Outreach (Signed)
[facility]) Care Management  07/21/2017  Samantha Aguirre [birth year] [id number] Telephone assessment   Transition of care by PCP office   [facility] care management following patient following recent stay at rehab following hospitalization :  March 22-27,2019 for COPD, Stroke (acute CVA)with right side weakness Recent hospital admission to [facility] on 6/24-6/29 with GI bleed , Anemia, EGD, Angiodysplasia and duodenum with hemorrhage. Capsule study  Patient transferred to [facility] on 6/29-7/2  for procedure Small intestine endo .  Patient with ED visit at [facility] on 7/3 with complaint of weakness, Melena . PMHx includes but not limited to diabetes, CAD, chronic atrial fib,low back pain, hypertension, GERD, MI .    Unsuccessful outreach call to patient, no voicemail to leave a message.  Placed call to patient daughter Samantha Aguirre, HIPAA verified, she discussed she has spoken with her mother this morning and will give her my contact number to call. Return call from patient ,HIPAA verified  Explained reason for the call  Patient reports that she is doing pretty good on today. She discussed home health physical therapy has visited her on this morning and will see her twice weekly as well as HHRN that visited yesterday and will see twice weekly. Patient reports she was able to work with physical therapy on today. Patient reports her blood pressure was good on today. She usually checks it daily as well as weighs, today's reading 180.   Patient further discussed:  COPD Breathing is doing okay,she denies any increase in shortness of breath , she uses albuterol inhaler at least once daily . Patient discussed she believes her PCP will refer to lung doctor.   CVA Patient reports she has gotten  90% function back on the right side, but left side is weak due to recent symptoms.   Recent GI bleed Patient discussed her recent hospital admissions at [facility] , [facility] and recent visit to ED. Patient  reports that her stools are brown colored, she denies abdominal pain, tolerating diet.   Patient discussed she had PCP visit has been rescheduled to next Monday, and she has arranged her ride through [facility] transportation.   Patient was recently discharged from hospital and all medications have been reviewed.   Patient is agreeable to home visit in the next week.    Plan Will schedule home visit in next week.    [facility] CM Care Plan Problem One     Most Recent Value  Care Plan Problem One  High risk for readmission related to recent hospital admission for GI bleed   Role Documenting the Problem One  Care Management [place] for Problem One  Active  [facility] Long Term Goal   Patient will not experience a hospital admission in the next 31 days   [facility] Long Term Goal Start Date  07/21/17  Interventions for Problem One Long Term Goal  Reviewed taking medications as prescribed, reinforced keeping all scheduled follow ups   [facility] CM Short Term Goal #1   Patient will be able to report signs /symptoms of bleeding over the next 20 days   [facility] CM Short Term Goal #1 Start Date  07/21/17  Interventions for Short Term Goal #1  Discussed symptoms to notify MD of dark stools, or noted blood, abdominal cramping pain, nausea and vomiting .   [facility] CM Short Term Goal #2   Patient will report increase strenght and balance over the next 30 days   [facility] CM Short Term Goal #2 Start  Date  07/21/17  Interventions for Short Term Goal #2  Discussed participating in therapy session and exercises     [facility] CM Care Plan Problem Two     Most Recent Value  Care Plan Problem Two  COPD- recent hospitalization   Role Documenting the Problem Two  Care Management Coordinator  Care Plan for Problem Two  Not Active  [facility] Long Term Goal  Pt would have no issues with COPD in the next 62 days   THN Long Term Goal Start Date  06/10/17  [facility] CM Short Term Goal #1   Patient will be able to recognize at least 3 signs of worsening  of COPD over the 20 days   [facility]N CM Short Term Goal #1 Start Date  06/24/17  [facility] CM Short Term Goal #2   Patient will be able to report continued activity tolerance of usual activities over the next 20   [facility] CM Short Term Goal #2 Start Date  06/24/17      [name], RN, [place] Management Coordinator  [phone]- Mobile [phone]- [place]

## 2017-07-22 ENCOUNTER — Emergency Department: Payer: Medicare Other

## 2017-07-22 ENCOUNTER — Emergency Department
Admission: EM | Admit: 2017-07-22 | Discharge: 2017-07-22 | Disposition: A | Discharge status: Home / Self Care | Payer: Medicare Other | Attending: Emergency Medicine | Admitting: Emergency Medicine

## 2017-07-22 ENCOUNTER — Encounter: Payer: Self-pay | Admitting: Gastroenterology

## 2017-07-22 ENCOUNTER — Other Ambulatory Visit: Payer: Self-pay

## 2017-07-22 DIAGNOSIS — I110 Hypertensive heart disease with heart failure: Secondary | ICD-10-CM | POA: Insufficient documentation

## 2017-07-22 DIAGNOSIS — I252 Old myocardial infarction: Secondary | ICD-10-CM | POA: Diagnosis not present

## 2017-07-22 DIAGNOSIS — I5043 Acute on chronic combined systolic (congestive) and diastolic (congestive) heart failure: Secondary | ICD-10-CM

## 2017-07-22 DIAGNOSIS — Z7982 Long term (current) use of aspirin: Secondary | ICD-10-CM | POA: Insufficient documentation

## 2017-07-22 DIAGNOSIS — I4891 Unspecified atrial fibrillation: Secondary | ICD-10-CM | POA: Insufficient documentation

## 2017-07-22 DIAGNOSIS — Z7984 Long term (current) use of oral hypoglycemic drugs: Secondary | ICD-10-CM | POA: Diagnosis not present

## 2017-07-22 DIAGNOSIS — E1140 Type 2 diabetes mellitus with diabetic neuropathy, unspecified: Secondary | ICD-10-CM | POA: Diagnosis not present

## 2017-07-22 DIAGNOSIS — E038 Other specified hypothyroidism: Secondary | ICD-10-CM | POA: Insufficient documentation

## 2017-07-22 DIAGNOSIS — I2510 Atherosclerotic heart disease of native coronary artery without angina pectoris: Secondary | ICD-10-CM | POA: Insufficient documentation

## 2017-07-22 DIAGNOSIS — I69354 Hemiplegia and hemiparesis following cerebral infarction affecting left non-dominant side: Secondary | ICD-10-CM | POA: Diagnosis not present

## 2017-07-22 DIAGNOSIS — E119 Type 2 diabetes mellitus without complications: Secondary | ICD-10-CM | POA: Insufficient documentation

## 2017-07-22 DIAGNOSIS — J449 Chronic obstructive pulmonary disease, unspecified: Secondary | ICD-10-CM | POA: Diagnosis not present

## 2017-07-22 DIAGNOSIS — R0789 Other chest pain: Secondary | ICD-10-CM | POA: Diagnosis not present

## 2017-07-22 DIAGNOSIS — R569 Unspecified convulsions: Secondary | ICD-10-CM | POA: Diagnosis not present

## 2017-07-22 DIAGNOSIS — R079 Chest pain, unspecified: Secondary | ICD-10-CM | POA: Diagnosis not present

## 2017-07-22 DIAGNOSIS — I5032 Chronic diastolic (congestive) heart failure: Secondary | ICD-10-CM | POA: Diagnosis not present

## 2017-07-22 DIAGNOSIS — K264 Chronic or unspecified duodenal ulcer with hemorrhage: Secondary | ICD-10-CM | POA: Diagnosis not present

## 2017-07-22 DIAGNOSIS — F17210 Nicotine dependence, cigarettes, uncomplicated: Secondary | ICD-10-CM | POA: Insufficient documentation

## 2017-07-22 LAB — CBC
HCT: 29.5 % — ABNORMAL LOW (ref 35.0–47.0)
HEMOGLOBIN: 9.5 g/dL — AB (ref 12.0–16.0)
MCH: 24.4 pg — AB (ref 26.0–34.0)
MCHC: 32.3 g/dL (ref 32.0–36.0)
MCV: 75.5 fL — ABNORMAL LOW (ref 80.0–100.0)
Platelets: 336 10*3/uL (ref 150–440)
RBC: 3.91 MIL/uL (ref 3.80–5.20)
RDW: 26.7 % — ABNORMAL HIGH (ref 11.5–14.5)
WBC: 6.9 10*3/uL (ref 3.6–11.0)

## 2017-07-22 LAB — BASIC METABOLIC PANEL
Anion gap: 11 (ref 5–15)
BUN: 7 mg/dL — ABNORMAL LOW (ref 8–23)
CHLORIDE: 110 mmol/L (ref 98–111)
CO2: 24 mmol/L (ref 22–32)
Calcium: 8.8 mg/dL — ABNORMAL LOW (ref 8.9–10.3)
Creatinine, Ser: 0.57 mg/dL (ref 0.44–1.00)
GFR calc non Af Amer: >60 mL/min (ref >60)
Glucose, Bld: 111 mg/dL — ABNORMAL HIGH (ref 70–99)
Potassium: 3 mmol/L — ABNORMAL LOW (ref 3.5–5.1)
Sodium: 145 mmol/L (ref 135–145)

## 2017-07-22 LAB — TROPONIN I

## 2017-07-22 LAB — BRAIN NATRIURETIC PEPTIDE: B Natriuretic Peptide: 47 pg/mL (ref 0.0–100.0)

## 2017-07-22 MED ORDER — FUROSEMIDE 40 MG PO TABS
ORAL_TABLET | ORAL | Status: AC
  Filled 2017-07-22: qty 1

## 2017-07-22 MED ORDER — FUROSEMIDE 20 MG PO TABS
20.0000 mg | ORAL_TABLET | Freq: Once | ORAL | Status: AC
  Administered 2017-07-22: 20 mg via ORAL
  Filled 2017-07-22: qty 1

## 2017-07-22 NOTE — ED Provider Notes (Signed)
[facility] Emergency Department Provider Note  ____________________________________________   First MD Initiated Contact with Patient 07/22/17 1407     (approximate)  I have reviewed the triage vital signs and the nursing notes.   HISTORY  Chief Complaint Chest Pain and Leg Swelling   HPI Samantha Aguirre is a 72 y.o. female who comes to the emergency department via EMS with chest pain shortness of breath and a 2 pound weight gain for the past 24 hours.  She has a past medical history of congestive heart failure and a recent admission for the same.  She has been taking all of her medications as prescribed however last night when she noted she gained 2 pounds she became concerned and called 911.  Her chest pain is sharp in her upper chest and nonexertional.  Shortness of breath is worse when lying down and improved when standing up.  Leg swelling is equal.  No history of DVT or pulmonary embolism.  The chest pain is not ripping or tearing it does not go straight to her back.  It does not radiate.    Past Medical History:  Diagnosis Date  . Acid reflux   . Arthritis    Bilateral knees, hands, feet  . Asthma   . Atrial fibrillation ([place])   . Cancer ([place])   . Chest pain   . CHF (congestive heart failure) (HCC)    Diastolic CHF  . COPD (chronic obstructive pulmonary disease) ([place])   . Coronary artery disease   . Diabetes mellitus without complication ([place])   . Frequent headaches   . Heart attack ([place])   . High cholesterol   . HLD (hyperlipidemia)   . Hypertension   . MI (myocardial infarction) ([place])   . Seizures ([place])   . Skin cancer 2015   Suspected basal cell carcinoma on nose, surgically removed  . Stroke ([place])   . Thyroid disease   . Tremors of nervous system     Patient Active Problem List   Diagnosis Date Noted  . GI bleed 07/08/2017  . Anemia 07/08/2017  . Angiodysplasia of stomach and duodenum with hemorrhage   . Stroke (cerebrum)  ([place])   . Goals of care, counseling/discussion   . Palliative care encounter   . COPD exacerbation ([place]) 04/03/2017  . Flu 03/27/2017  . Iron deficiency anemia due to chronic blood loss   . Migraine 02/13/2017  . Cataracts, bilateral 01/15/2017  . Chronic venous insufficiency 10/06/2016  . Recurrent major depressive disorder, in partial remission ([place]) 10/06/2016  . Left-sided weakness 10/03/2016  . Chest pain 02/27/2016  . History of cerebrovascular accident (CVA) with residual deficit 02/19/2016  . Thumb pain, left 01/22/2016  . Traumatic closed nondisplaced fracture of base of metacarpal bone of left thumb 01/22/2016  . Colon cancer screening 11/27/2015  . Depression 11/14/2015  . Coronary artery disease 11/13/2015  . Hypertension 11/13/2015  . History of skin cancer 11/13/2015  . Osteoporosis 11/13/2015  . Chronic low back pain 11/13/2015  . Left medial knee pain 11/13/2015  . Osteoarthritis of multiple joints 11/13/2015  . Other specified hypothyroidism 08/16/2015  . Controlled type 2 diabetes mellitus with diabetic neuropathy ([place]) 08/16/2015  . COPD (chronic obstructive pulmonary disease) ([place]) 08/16/2015  . Tobacco abuse 08/16/2015  . HLD (hyperlipidemia) 08/16/2015  . History of CHF (congestive heart failure) 08/16/2015  . Seizures ([place])     Past Surgical History:  Procedure Laterality Date  . ABDOMINAL HYSTERECTOMY  1976  . APPENDECTOMY  1980  . CAROTID ENDARTERECTOMY    . CHOLECYSTECTOMY  1980  . COLONOSCOPY N/A 02/20/2017   Procedure: COLONOSCOPY;  Surgeon: Lin Landsman, MD;  Location[facility] ENDOSCOPY;  Service: Gastroenterology;  Laterality: N/A;  . ESOPHAGOGASTRODUODENOSCOPY N/A 02/20/2017   Procedure: ESOPHAGOGASTRODUODENOSCOPY (EGD);  Surgeon: Lin Landsman, MD;  Location: [facility] ENDOSCOPY;  Service: Gastroenterology;  Laterality: N/A;  . ESOPHAGOGASTRODUODENOSCOPY (EGD) WITH PROPOFOL N/A 07/08/2017   Procedure: ESOPHAGOGASTRODUODENOSCOPY (EGD) WITH  PROPOFOL;  Surgeon: [name], MD;  Location: [facility] ENDOSCOPY;  Service: Endoscopy;  Laterality: N/A;  . [place]  . GIVENS CAPSULE STUDY N/A 07/09/2017   Procedure: GIVENS CAPSULE STUDY;  Surgeon: Jonathon Bellows, MD;  Location: [facility] ENDOSCOPY;  Service: Gastroenterology;  Laterality: N/A;  . KNEE SURGERY     left  . ROTATOR CUFF REPAIR     right  . TONSILLECTOMY  1950    Prior to Admission medications   Medication Sig Start Date End Date Taking? Authorizing Provider  albuterol (PROVENTIL HFA;VENTOLIN HFA) 108 (90 Base) MCG/ACT inhaler Inhale 2 puffs into the lungs every 6 (six) hours as needed for wheezing or shortness of breath. 10/08/16   Karamalegos, Devonne Doughty, DO  aspirin EC 81 MG tablet Take 1 tablet (81 mg total) by mouth daily. 07/10/17 07/10/18  Vaughan Basta, MD  atorvastatin (LIPITOR) 80 MG tablet Take 1 tablet (80 mg total) by mouth daily at 6 PM. 09/24/16   Karamalegos, Devonne Doughty, DO  diclofenac sodium (VOLTAREN) 1 % GEL Apply 2 g topically 4 (four) times daily. 01/11/16   Daymon Larsen, MD  DULoxetine (CYMBALTA) 60 MG capsule Take 1 capsule (60 mg total) by mouth at bedtime. 09/24/16   Karamalegos, Devonne Doughty, DO  ferrous sulfate 325 (65 FE) MG EC tablet Take 1 tablet (325 mg total) by mouth 2 (two) times daily with a meal. 02/21/17   Sudini, Srikar, MD  fluticasone (FLONASE) 50 MCG/ACT nasal spray Place 1 spray into both nostrils daily. 10/08/16   Karamalegos, Devonne Doughty, DO  furosemide (LASIX) 20 MG tablet Take 10 mg by mouth daily.     Karamalegos, Alexander J, DO  gabapentin (NEURONTIN) 800 MG tablet Take 1 tablet (800 mg total) by mouth 2 (two) times daily. 09/24/16   Karamalegos, Devonne Doughty, DO  glipiZIDE-metformin (METAGLIP) 5-500 MG tablet Take 1 tablet by mouth 2 (two) times daily before a meal. 04/02/17   Karamalegos, Devonne Doughty, DO  levETIRAcetam (KEPPRA) 500 MG tablet Take 1 tablet (500 mg total) by mouth 2 (two) times daily. 09/19/15    Epifanio Lesches, MD  levothyroxine (SYNTHROID, LEVOTHROID) 175 MCG tablet Take 1 tablet (175 mcg total) by mouth daily before breakfast. Patient taking differently: Take 112 mcg by mouth daily before breakfast.  09/24/16   Karamalegos, Devonne Doughty, DO  nitroGLYCERIN (NITROSTAT) 0.4 MG SL tablet Place 1 tablet (0.4 mg total) under the tongue every 5 (five) minutes as needed for chest pain. 02/28/16   Bettey Costa, MD  omeprazole (PRILOSEC) 20 MG capsule Take 20 mg by mouth 2 (two) times daily. Total 40 mg twice daily    [provider]  potassium chloride (MICRO-K) 10 MEQ CR capsule Take 1 capsule (10 mEq total) by mouth daily. Patient taking differently: Take 10 mEq by mouth 2 (two) times daily.  09/24/16   Karamalegos, Devonne Doughty, DO  sucralfate (CARAFATE) 1 g tablet TAKE (1) TABLET BY MOUTH FOUR TIMES A DAY 03/26/17   Karamalegos, Devonne Doughty, DO  umeclidinium bromide (INCRUSE ELLIPTA)  62.5 MCG/INH AEPB Inhale 1 puff into the lungs daily. 11/13/15   Karamalegos, Devonne Doughty, DO  vitamin B-12 (CYANOCOBALAMIN) 1000 MCG tablet Take 1 tablet by mouth daily. 06/29/17   [provider]    Allergies Mushroom extract complex; Atenolol; Ivp dye [iodinated diagnostic agents]; Morphine and related; Percocet [oxycodone-acetaminophen]; Percodan [oxycodone-aspirin]; and Verapamil  Family History  Problem Relation Age of Onset  . Breast cancer Mother   . Heart disease Mother   . Stroke Mother   . Cancer Mother   . COPD Mother   . Diabetes Mother   . Heart disease Father   . Diabetes Father   . Stroke Father   . Alcohol abuse Sister   . Drug abuse Sister   . Stroke Sister   . Cancer Sister   . Mental illness Sister   . Heart disease Brother   . Arthritis Brother   . Diabetes Brother   . Heart disease Maternal Grandfather   . Heart disease Paternal Grandfather     Social History Social History   Tobacco Use  . Smoking status: Current Every Day Smoker    Packs/day: 1.00      Years: 50.00    Pack years: 50.00    Types: Cigarettes  . Smokeless tobacco: Never Used  Substance Use Topics  . Alcohol use: No  . Drug use: No    Review of Systems Constitutional: No fever/chills Eyes: No visual changes. ENT: No sore throat. Cardiovascular: Positive for chest pain. Respiratory: Positive for shortness of breath. Gastrointestinal: No abdominal pain.  No nausea, no vomiting.  No diarrhea.  No constipation. Genitourinary: Negative for dysuria. Musculoskeletal: Negative for back pain. Skin: Negative for rash. Neurological: Negative for headaches, focal weakness or numbness.   ____________________________________________   PHYSICAL EXAM:  VITAL SIGNS: ED Triage Vitals [07/22/17 1224]  Enc Vitals Group     BP      Pulse Rate 73     Resp 18     Temp 98.2 F (36.8 C)     Temp Source Oral     SpO2 98 %     Weight 181 lb (82.1 kg)     Height 5\' 1"  (1.549 m)     Head Circumference      Peak Flow      Pain Score 6     Pain Loc      Pain Edu?      Excl. in [place]?     Constitutional: Alert and oriented x4 clearly somewhat anxious appearing although nontoxic no diaphoresis and speaks in full clear sentences Eyes: PERRL EOMI. Head: Atraumatic. Nose: No congestion/rhinnorhea. Mouth/Throat: No trismus Neck: No stridor.  Able to lie completely flat with no JVD Cardiovascular: Normal rate, regular rhythm. Grossly normal heart sounds.  Good peripheral circulation. Respiratory: Normal respiratory effort.  No retractions.  Very faint crackles at bilateral bases Gastrointestinal: Soft nontender Musculoskeletal: 1+ pitting edema to bilateral lower extremities of the legs are equal in size with no erythema or cords Neurologic:  Normal speech and language. No gross focal neurologic deficits are appreciated. Skin:  Skin is warm, dry and intact. No rash noted. Psychiatric: Somewhat anxious appearing    ____________________________________________   DIFFERENTIAL  includes but not limited to  CHF exacerbation, acute coronary syndrome, pulmonary embolism, pneumothorax ____________________________________________   LABS (all labs ordered are listed, but only abnormal results are displayed)  Labs Reviewed  BASIC METABOLIC PANEL - Abnormal; Notable for the following components:  Result Value   Potassium 3.0 (*)    Glucose, Bld 111 (*)    BUN 7 (*)    Calcium 8.8 (*)    All other components within normal limits  CBC - Abnormal; Notable for the following components:   Hemoglobin 9.5 (*)    HCT 29.5 (*)    MCV 75.5 (*)    MCH 24.4 (*)    RDW 26.7 (*)    All other components within normal limits  TROPONIN I  BRAIN NATRIURETIC PEPTIDE  TROPONIN I    Lab work reviewed by me with no signs of acute ischemia x2 __________________________________________  EKG  ED ECG REPORT I, Darel Hong, the attending physician, personally viewed and interpreted this ECG.  Date: 07/24/2017 EKG Time:  Rate: 75 Rhythm: normal sinus rhythm QRS Axis: normal Intervals: normal ST/T Wave abnormalities: normal Narrative Interpretation: no evidence of acute ischemia  ____________________________________________  RADIOLOGY  Chest x-ray reviewed by me with no acute disease ____________________________________________   PROCEDURES  Procedure(s) performed: no  Procedures  Critical Care performed: no  ____________________________________________   INITIAL IMPRESSION / ASSESSMENT AND PLAN / ED COURSE  Pertinent labs & imaging results that were available during my care of the patient were reviewed by me and considered in my medical decision making (see chart for details).   Patient arrives hemodynamically stable with a somewhat anxious appearing.  Her clinical exam is not suggestive of fluid overload.  2 troponins are negative.  I had a lengthy discussion with the patient regarding her diagnosis and that she is doing quite well with her heart  failure at this point.  I will refer her back to heart failure clinic tomorrow for continued management of her failure.  Strict return precautions have been given to the patient verbalized understanding agree with plan.      ____________________________________________   FINAL CLINICAL IMPRESSION(S) / ED DIAGNOSES  Final diagnoses:  Acute on chronic combined systolic and diastolic congestive heart failure ([place])  Atypical chest pain      NEW MEDICATIONS STARTED DURING THIS VISIT:  Discharge Medication List as of 07/22/2017  3:17 PM       Note:  This document was prepared using Dragon voice recognition software and may include unintentional dictation errors.     Darel Hong, MD 07/24/17 1239

## 2017-07-22 NOTE — Discharge Instructions (Signed)
Please double your home Lasix dose to 10 mg by mouth twice a day and follow-up in the heart failure clinic tomorrow for reevaluation.  Return to the emergency department sooner for any concerns.  It was a pleasure to take care of you today, and thank you for coming to our emergency department.  If you have any questions or concerns before leaving please ask the nurse to grab me and I'm more than happy to go through your aftercare instructions again.  If you were prescribed any opioid pain medication today such as Norco, Vicodin, Percocet, morphine, hydrocodone, or oxycodone please make sure you do not drive when you are taking this medication as it can alter your ability to drive safely.  If you have any concerns once you are home that you are not improving or are in fact getting worse before you can make it to your follow-up appointment, please do not hesitate to call 911 and come back for further evaluation.  Darel Hong, MD  Results for orders placed or performed during the hospital encounter of 32/35/57  Basic metabolic panel  Result Value Ref Range   Sodium 145 135 - 145 mmol/L   Potassium 3.0 (L) 3.5 - 5.1 mmol/L   Chloride 110 98 - 111 mmol/L   CO2 24 22 - 32 mmol/L   Glucose, Bld 111 (H) 70 - 99 mg/dL   BUN 7 (L) 8 - 23 mg/dL   Creatinine, Ser 0.57 0.44 - 1.00 mg/dL   Calcium 8.8 (L) 8.9 - 10.3 mg/dL   GFR calc non Af Amer >60 >60 mL/min   GFR calc Af Amer >60 >60 mL/min   Anion gap 11 5 - 15  CBC  Result Value Ref Range   WBC 6.9 3.6 - 11.0 K/uL   RBC 3.91 3.80 - 5.20 MIL/uL   Hemoglobin 9.5 (L) 12.0 - 16.0 g/dL   HCT 29.5 (L) 35.0 - 47.0 %   MCV 75.5 (L) 80.0 - 100.0 fL   MCH 24.4 (L) 26.0 - 34.0 pg   MCHC 32.3 32.0 - 36.0 g/dL   RDW 26.7 (H) 11.5 - 14.5 %   Platelets 336 150 - 440 K/uL  Troponin I  Result Value Ref Range   Troponin I <0.03 <0.03 ng/mL  Brain natriuretic peptide  Result Value Ref Range   B Natriuretic Peptide 47.0 0.0 - 100.0 pg/mL  Troponin I    Result Value Ref Range   Troponin I <0.03 <0.03 ng/mL   Ct Abdomen Pelvis Wo Contrast  Result Date: 07/15/2017 CLINICAL DATA:  Gastrointestinal bleeding. Abdominal pain. Recent endoscopy and colonoscopy. EXAM: CT ABDOMEN AND PELVIS WITHOUT CONTRAST TECHNIQUE: Multidetector CT imaging of the abdomen and pelvis was performed following the standard protocol without IV contrast. COMPARISON:  12/12/2013 FINDINGS: Lower chest: Normal Hepatobiliary: Liver appears normal without contrast. Previous cholecystectomy. Pancreas: Normal Spleen: Normal Adrenals/Urinary Tract: Adrenal glands are normal. 1 mm nonobstructing stone in the lower pole of the right kidney. Left kidney is normal. Bladder is normal. Stomach/Bowel: Diverticulosis of the left colon without evidence of diverticulitis. No definable mass. No definable inflammatory change. No sign of obstruction. Small duodenal lipoma of no significance as previously seen. Vascular/Lymphatic: Aortic atherosclerosis. No aneurysm. IVC is normal. No retroperitoneal adenopathy. Reproductive: Previous hysterectomy.  No pelvic mass. Other: No free fluid or air. Musculoskeletal: Old superior endplate fracture at L1 with loss of height of 30%. Lower lumbar degenerative disc disease and degenerative facet disease. IMPRESSION: No acute finding by CT. No sign  of bowel obstruction, bowel mass or bowel inflammation. Ordinary uncomplicated diverticulosis of the left colon. Aortic atherosclerosis. Previous cholecystectomy and hysterectomy. Old L1 fracture.  Lower lumbar degenerative changes. Electronically Signed   By: Nelson Chimes M.D.   On: 07/15/2017 17:20   Dg Chest 2 View  Result Date: 07/22/2017 CLINICAL DATA:  Chest pain beginning this morning. EXAM: CHEST - 2 VIEW COMPARISON:  Single-view of the chest 04/03/2017. PA and lateral chest 02/27/2016. FINDINGS: A punctate calcified granuloma left upper lobe is again seen. Lungs otherwise clear. Heart size normal. No pneumothorax or  pleural effusion. Aortic atherosclerosis noted. No acute or focal bony abnormality. IMPRESSION: No acute disease. Atherosclerosis. Electronically Signed   By: Inge Rise M.D.   On: 07/22/2017 13:23   Ct Head Wo Contrast  Result Date: 07/06/2017 CLINICAL DATA:  Slight facial droop and weakness since last night at 9 p.m. EXAM: CT HEAD WITHOUT CONTRAST TECHNIQUE: Contiguous axial images were obtained from the base of the skull through the vertex without intravenous contrast. COMPARISON:  04/05/2017 FINDINGS: Brain: No evidence of acute infarction, hemorrhage, hydrocephalus, extra-axial collection or mass lesion/mass effect. Moderate remote left MCA territory infarct centered in the left parietal and posterior temporal lobes. Patchy low-density in the cerebral white matter from chronic small vessel ischemia. Small remote bilateral cerebellar infarcts. Vascular: No hyperdense vessel. Skull: No acute finding Sinuses/Orbits: Negative Other: These results were called by telephone at the time of interpretation on 07/06/2017 at 9:56 am to Dr. Conni Slipper , who verbally acknowledged these results. IMPRESSION: 1. No acute finding. 2. Chronic ischemic injury is described above. Electronically Signed   By: Monte Fantasia M.D.   On: 07/06/2017 09:58   [name]  Result Date: 07/06/2017 CLINICAL DATA:  Focal neuro deficit. History of stroke, atrial fibrillation. On Eliquis EXAM: MRI HEAD WITHOUT CONTRAST TECHNIQUE: Multiplanar, multiecho pulse sequences of the brain and surrounding structures were obtained without intravenous contrast. COMPARISON:  MRI head 04/05/2017 FINDINGS: Brain: Left temporoparietal infarct with volume loss and mild cortical hemorrhage is noted on the prior study. Moderate chronic microvascular ischemic change throughout the white matter. Small chronic infarcts in the cerebellum bilaterally. Negative for acute infarct. 1 cm calcified extra-axial mass over the right convexity  compatible with a small meningioma unchanged from prior studies. No adjacent edema in the brain. Mild to moderate atrophy unchanged. Vascular: Normal arterial flow void Skull and upper cervical spine: Negative Sinuses/Orbits: Mild mucosal edema paranasal sinuses. Normal orbit bilaterally Other: None IMPRESSION: Negative for acute infarct Atrophy with moderate to advanced chronic ischemic changes. 1 cm right convexity meningioma unchanged from prior studies. Electronically Signed   By: Franchot Gallo M.D.   On: 07/06/2017 12:56

## 2017-07-22 NOTE — ED Triage Notes (Signed)
CP that began this AM. Leg swelling that has increased over the last few days. Pt reports 2lb of weight gain overnight. Pt alert and oriented X4, active, cooperative, pt in NAD. RR even and unlabored, color WNL.

## 2017-07-22 NOTE — ED Triage Notes (Signed)
First nurse note: Pt here from home via [facility] with c/o chest pain and SHOB, +2lb weight gain since last night.

## 2017-07-22 NOTE — ED Notes (Signed)
Rounded on patient at this time.  Updated on plan of care.

## 2017-07-23 DIAGNOSIS — E1140 Type 2 diabetes mellitus with diabetic neuropathy, unspecified: Secondary | ICD-10-CM | POA: Diagnosis not present

## 2017-07-23 DIAGNOSIS — I110 Hypertensive heart disease with heart failure: Secondary | ICD-10-CM | POA: Diagnosis not present

## 2017-07-23 DIAGNOSIS — K264 Chronic or unspecified duodenal ulcer with hemorrhage: Secondary | ICD-10-CM | POA: Diagnosis not present

## 2017-07-23 DIAGNOSIS — I2510 Atherosclerotic heart disease of native coronary artery without angina pectoris: Secondary | ICD-10-CM | POA: Diagnosis not present

## 2017-07-23 DIAGNOSIS — I5032 Chronic diastolic (congestive) heart failure: Secondary | ICD-10-CM | POA: Diagnosis not present

## 2017-07-23 DIAGNOSIS — I69354 Hemiplegia and hemiparesis following cerebral infarction affecting left non-dominant side: Secondary | ICD-10-CM | POA: Diagnosis not present

## 2017-07-24 DIAGNOSIS — K264 Chronic or unspecified duodenal ulcer with hemorrhage: Secondary | ICD-10-CM | POA: Diagnosis not present

## 2017-07-24 DIAGNOSIS — I69354 Hemiplegia and hemiparesis following cerebral infarction affecting left non-dominant side: Secondary | ICD-10-CM | POA: Diagnosis not present

## 2017-07-24 DIAGNOSIS — I110 Hypertensive heart disease with heart failure: Secondary | ICD-10-CM | POA: Diagnosis not present

## 2017-07-24 DIAGNOSIS — I5032 Chronic diastolic (congestive) heart failure: Secondary | ICD-10-CM | POA: Diagnosis not present

## 2017-07-24 DIAGNOSIS — I2510 Atherosclerotic heart disease of native coronary artery without angina pectoris: Secondary | ICD-10-CM | POA: Diagnosis not present

## 2017-07-24 DIAGNOSIS — E1140 Type 2 diabetes mellitus with diabetic neuropathy, unspecified: Secondary | ICD-10-CM | POA: Diagnosis not present

## 2017-07-27 DIAGNOSIS — K264 Chronic or unspecified duodenal ulcer with hemorrhage: Secondary | ICD-10-CM | POA: Diagnosis not present

## 2017-07-27 DIAGNOSIS — I10 Essential (primary) hypertension: Secondary | ICD-10-CM | POA: Diagnosis not present

## 2017-07-27 DIAGNOSIS — K922 Gastrointestinal hemorrhage, unspecified: Secondary | ICD-10-CM | POA: Diagnosis not present

## 2017-07-27 DIAGNOSIS — G8929 Other chronic pain: Secondary | ICD-10-CM | POA: Diagnosis not present

## 2017-07-27 DIAGNOSIS — R197 Diarrhea, unspecified: Secondary | ICD-10-CM | POA: Diagnosis not present

## 2017-07-27 DIAGNOSIS — Z720 Tobacco use: Secondary | ICD-10-CM | POA: Diagnosis not present

## 2017-07-27 DIAGNOSIS — Z09 Encounter for follow-up examination after completed treatment for conditions other than malignant neoplasm: Secondary | ICD-10-CM | POA: Diagnosis not present

## 2017-07-27 DIAGNOSIS — M545 Low back pain: Secondary | ICD-10-CM | POA: Diagnosis not present

## 2017-07-27 DIAGNOSIS — I5032 Chronic diastolic (congestive) heart failure: Secondary | ICD-10-CM | POA: Diagnosis not present

## 2017-07-27 DIAGNOSIS — E1140 Type 2 diabetes mellitus with diabetic neuropathy, unspecified: Secondary | ICD-10-CM | POA: Diagnosis not present

## 2017-07-27 DIAGNOSIS — I110 Hypertensive heart disease with heart failure: Secondary | ICD-10-CM | POA: Diagnosis not present

## 2017-07-27 DIAGNOSIS — I2510 Atherosclerotic heart disease of native coronary artery without angina pectoris: Secondary | ICD-10-CM | POA: Diagnosis not present

## 2017-07-27 DIAGNOSIS — J449 Chronic obstructive pulmonary disease, unspecified: Secondary | ICD-10-CM | POA: Diagnosis not present

## 2017-07-27 DIAGNOSIS — I69354 Hemiplegia and hemiparesis following cerebral infarction affecting left non-dominant side: Secondary | ICD-10-CM | POA: Diagnosis not present

## 2017-07-28 DIAGNOSIS — E1140 Type 2 diabetes mellitus with diabetic neuropathy, unspecified: Secondary | ICD-10-CM | POA: Diagnosis not present

## 2017-07-28 DIAGNOSIS — K264 Chronic or unspecified duodenal ulcer with hemorrhage: Secondary | ICD-10-CM | POA: Diagnosis not present

## 2017-07-28 DIAGNOSIS — I2510 Atherosclerotic heart disease of native coronary artery without angina pectoris: Secondary | ICD-10-CM | POA: Diagnosis not present

## 2017-07-28 DIAGNOSIS — I110 Hypertensive heart disease with heart failure: Secondary | ICD-10-CM | POA: Diagnosis not present

## 2017-07-28 DIAGNOSIS — I5032 Chronic diastolic (congestive) heart failure: Secondary | ICD-10-CM | POA: Diagnosis not present

## 2017-07-28 DIAGNOSIS — I69354 Hemiplegia and hemiparesis following cerebral infarction affecting left non-dominant side: Secondary | ICD-10-CM | POA: Diagnosis not present

## 2017-07-29 ENCOUNTER — Other Ambulatory Visit: Payer: Self-pay | Admitting: Family Medicine

## 2017-07-29 ENCOUNTER — Other Ambulatory Visit: Payer: Self-pay | Admitting: *Deleted

## 2017-07-29 ENCOUNTER — Ambulatory Visit: Payer: Self-pay | Admitting: *Deleted

## 2017-07-29 DIAGNOSIS — G43919 Migraine, unspecified, intractable, without status migrainosus: Secondary | ICD-10-CM

## 2017-07-29 NOTE — Patient Outreach (Signed)
[facility]) Care Management  07/29/2017  Samantha Aguirre 08/26/1945 [id number]   Transition of care by PCP office   0900 Incoming telephone call from patient   [facility] care management following patient following recent stay at rehab following hospitalization : March 22-27,2019 for COPD, Stroke (acute CVA)with right side weakness Recent hospital admission to [facility] on 6/24-6/29 with GI bleed , Anemia, EGD, Angiodysplasia and duodenum with hemorrhage. Capsule study  Patient transferred to [facility] on 6/29-7/2 for procedure Small intestine endo .  Patient with ED visit at [facility] on 7/3 with complaint of weakness, Melena . PMHx includes but not limited to diabetes, CAD, chronic atrial fib,low back pain, hypertension, GERD, MI .    Received incoming call from patient, to cancel scheduled home visit for today, patient discussed that she also cancelled her home health RN /PT for today.  Patient she doesn't feel well on today, having diarrhea, she reports her stools are brown in color no blood noted, denies abdominal pain.  Patient reports not much appetite on today, denies nausea or vomiting, but she is drinking liquids and will make toast later on. Patient reports her blood sugar was 125 this morning,discussed with patient checking blood sugar more frequently on today due to decreased appetite, reviewed with patient symptoms of low blood sugar and readings, she discussed how she would normally treat low blood sugar with juice, she does not have any on hand at present,reviewed other snack options, she will check with her neighbor about having juice or hard candy , reviewed rule of 15 .   Patient discussed her visit to PCP on Monday and lasix has been increased, her weight today was 178 and  for the last 2 days and was 182 on Monday, Patient denies increase of shortness of breath, cough  patient states she is just about to turn her air conditioning on getting warmer in her  apartment. Discussed using air conditioning to avoid getting too hot and increasing work of breathing.    Patient is agreeable to rescheduling home visit for the next week.   Plan  Reinforced with patient to notify RN, MD of unresolved or worsening of symptoms of diarrhea.  Will plan home visit in the next week.   [name], RN, [place] Management Coordinator  [phone]- Mobile [phone]- Toll Free Main Office

## 2017-07-30 ENCOUNTER — Emergency Department (HOSPITAL_COMMUNITY): Payer: Medicare Other

## 2017-07-30 ENCOUNTER — Other Ambulatory Visit: Payer: Self-pay

## 2017-07-30 ENCOUNTER — Encounter (HOSPITAL_COMMUNITY): Payer: Self-pay | Admitting: Emergency Medicine

## 2017-07-30 ENCOUNTER — Inpatient Hospital Stay (HOSPITAL_COMMUNITY)
Admission: EM | Admit: 2017-07-30 | Discharge: 2017-08-03 | DRG: 065 | Disposition: A | Discharge status: Home Health | Payer: Medicare Other | Attending: Internal Medicine | Admitting: Internal Medicine

## 2017-07-30 DIAGNOSIS — I10 Essential (primary) hypertension: Secondary | ICD-10-CM | POA: Diagnosis present

## 2017-07-30 DIAGNOSIS — I2510 Atherosclerotic heart disease of native coronary artery without angina pectoris: Secondary | ICD-10-CM | POA: Diagnosis present

## 2017-07-30 DIAGNOSIS — Z743 Need for continuous supervision: Secondary | ICD-10-CM | POA: Diagnosis not present

## 2017-07-30 DIAGNOSIS — R29706 NIHSS score 6: Secondary | ICD-10-CM | POA: Diagnosis present

## 2017-07-30 DIAGNOSIS — E876 Hypokalemia: Secondary | ICD-10-CM | POA: Diagnosis present

## 2017-07-30 DIAGNOSIS — Z85828 Personal history of other malignant neoplasm of skin: Secondary | ICD-10-CM | POA: Diagnosis not present

## 2017-07-30 DIAGNOSIS — R4701 Aphasia: Secondary | ICD-10-CM | POA: Diagnosis present

## 2017-07-30 DIAGNOSIS — I63412 Cerebral infarction due to embolism of left middle cerebral artery: Secondary | ICD-10-CM

## 2017-07-30 DIAGNOSIS — K5900 Constipation, unspecified: Secondary | ICD-10-CM | POA: Diagnosis present

## 2017-07-30 DIAGNOSIS — E669 Obesity, unspecified: Secondary | ICD-10-CM | POA: Diagnosis present

## 2017-07-30 DIAGNOSIS — E870 Hyperosmolality and hypernatremia: Secondary | ICD-10-CM | POA: Diagnosis not present

## 2017-07-30 DIAGNOSIS — I5032 Chronic diastolic (congestive) heart failure: Secondary | ICD-10-CM | POA: Diagnosis present

## 2017-07-30 DIAGNOSIS — I6340 Cerebral infarction due to embolism of unspecified cerebral artery: Secondary | ICD-10-CM | POA: Diagnosis not present

## 2017-07-30 DIAGNOSIS — R4781 Slurred speech: Secondary | ICD-10-CM

## 2017-07-30 DIAGNOSIS — R531 Weakness: Secondary | ICD-10-CM

## 2017-07-30 DIAGNOSIS — I639 Cerebral infarction, unspecified: Secondary | ICD-10-CM | POA: Diagnosis present

## 2017-07-30 DIAGNOSIS — G40909 Epilepsy, unspecified, not intractable, without status epilepticus: Secondary | ICD-10-CM

## 2017-07-30 DIAGNOSIS — Z79899 Other long term (current) drug therapy: Secondary | ICD-10-CM

## 2017-07-30 DIAGNOSIS — E1169 Type 2 diabetes mellitus with other specified complication: Secondary | ICD-10-CM | POA: Diagnosis present

## 2017-07-30 DIAGNOSIS — Z720 Tobacco use: Secondary | ICD-10-CM | POA: Diagnosis present

## 2017-07-30 DIAGNOSIS — E1140 Type 2 diabetes mellitus with diabetic neuropathy, unspecified: Secondary | ICD-10-CM | POA: Diagnosis present

## 2017-07-30 DIAGNOSIS — E1151 Type 2 diabetes mellitus with diabetic peripheral angiopathy without gangrene: Secondary | ICD-10-CM | POA: Diagnosis present

## 2017-07-30 DIAGNOSIS — Z888 Allergy status to other drugs, medicaments and biological substances status: Secondary | ICD-10-CM | POA: Diagnosis not present

## 2017-07-30 DIAGNOSIS — Z7951 Long term (current) use of inhaled steroids: Secondary | ICD-10-CM

## 2017-07-30 DIAGNOSIS — D509 Iron deficiency anemia, unspecified: Secondary | ICD-10-CM | POA: Diagnosis present

## 2017-07-30 DIAGNOSIS — I110 Hypertensive heart disease with heart failure: Secondary | ICD-10-CM | POA: Diagnosis present

## 2017-07-30 DIAGNOSIS — Z7984 Long term (current) use of oral hypoglycemic drugs: Secondary | ICD-10-CM

## 2017-07-30 DIAGNOSIS — K264 Chronic or unspecified duodenal ulcer with hemorrhage: Secondary | ICD-10-CM | POA: Diagnosis not present

## 2017-07-30 DIAGNOSIS — J449 Chronic obstructive pulmonary disease, unspecified: Secondary | ICD-10-CM | POA: Diagnosis present

## 2017-07-30 DIAGNOSIS — Z91041 Radiographic dye allergy status: Secondary | ICD-10-CM | POA: Diagnosis not present

## 2017-07-30 DIAGNOSIS — F17210 Nicotine dependence, cigarettes, uncomplicated: Secondary | ICD-10-CM | POA: Diagnosis present

## 2017-07-30 DIAGNOSIS — Z66 Do not resuscitate: Secondary | ICD-10-CM | POA: Diagnosis present

## 2017-07-30 DIAGNOSIS — I69354 Hemiplegia and hemiparesis following cerebral infarction affecting left non-dominant side: Secondary | ICD-10-CM | POA: Diagnosis not present

## 2017-07-30 DIAGNOSIS — Z7982 Long term (current) use of aspirin: Secondary | ICD-10-CM

## 2017-07-30 DIAGNOSIS — I252 Old myocardial infarction: Secondary | ICD-10-CM

## 2017-07-30 DIAGNOSIS — R471 Dysarthria and anarthria: Secondary | ICD-10-CM | POA: Diagnosis present

## 2017-07-30 DIAGNOSIS — I480 Paroxysmal atrial fibrillation: Secondary | ICD-10-CM

## 2017-07-30 DIAGNOSIS — Z885 Allergy status to narcotic agent status: Secondary | ICD-10-CM

## 2017-07-30 DIAGNOSIS — Z8673 Personal history of transient ischemic attack (TIA), and cerebral infarction without residual deficits: Secondary | ICD-10-CM | POA: Diagnosis not present

## 2017-07-30 DIAGNOSIS — Z6834 Body mass index (BMI) 34.0-34.9, adult: Secondary | ICD-10-CM

## 2017-07-30 DIAGNOSIS — Z823 Family history of stroke: Secondary | ICD-10-CM

## 2017-07-30 DIAGNOSIS — K219 Gastro-esophageal reflux disease without esophagitis: Secondary | ICD-10-CM | POA: Diagnosis present

## 2017-07-30 DIAGNOSIS — E785 Hyperlipidemia, unspecified: Secondary | ICD-10-CM | POA: Diagnosis not present

## 2017-07-30 DIAGNOSIS — E038 Other specified hypothyroidism: Secondary | ICD-10-CM | POA: Diagnosis present

## 2017-07-30 DIAGNOSIS — D649 Anemia, unspecified: Secondary | ICD-10-CM | POA: Diagnosis present

## 2017-07-30 DIAGNOSIS — R29810 Facial weakness: Secondary | ICD-10-CM | POA: Diagnosis present

## 2017-07-30 DIAGNOSIS — E7800 Pure hypercholesterolemia, unspecified: Secondary | ICD-10-CM | POA: Diagnosis present

## 2017-07-30 DIAGNOSIS — R569 Unspecified convulsions: Secondary | ICD-10-CM

## 2017-07-30 DIAGNOSIS — G459 Transient cerebral ischemic attack, unspecified: Secondary | ICD-10-CM | POA: Diagnosis present

## 2017-07-30 DIAGNOSIS — R279 Unspecified lack of coordination: Secondary | ICD-10-CM | POA: Diagnosis not present

## 2017-07-30 DIAGNOSIS — R51 Headache: Secondary | ICD-10-CM | POA: Diagnosis not present

## 2017-07-30 HISTORY — DX: Gastrointestinal hemorrhage, unspecified: K92.2

## 2017-07-30 LAB — DIFFERENTIAL
BASOS ABS: 0.1 10*3/uL (ref 0.0–0.1)
BASOS PCT: 1 %
EOS ABS: 0.1 10*3/uL (ref 0.0–0.7)
EOS PCT: 2 %
LYMPHS PCT: 28 %
Lymphs Abs: 1.9 10*3/uL (ref 0.7–4.0)
MONOS PCT: 8 %
Monocytes Absolute: 0.5 10*3/uL (ref 0.1–1.0)
NEUTROS ABS: 4.1 10*3/uL (ref 1.7–7.7)
Neutrophils Relative %: 61 %
SMEAR REVIEW: ADEQUATE

## 2017-07-30 LAB — COMPREHENSIVE METABOLIC PANEL
ALK PHOS: 110 U/L (ref 38–126)
ALT: 10 U/L (ref 0–44)
AST: 22 U/L (ref 15–41)
Albumin: 3.4 g/dL — ABNORMAL LOW (ref 3.5–5.0)
Anion gap: 9 (ref 5–15)
BILIRUBIN TOTAL: 0.5 mg/dL (ref 0.3–1.2)
BUN: 6 mg/dL — AB (ref 8–23)
CALCIUM: 8.4 mg/dL — AB (ref 8.9–10.3)
CHLORIDE: 107 mmol/L (ref 98–111)
CO2: 25 mmol/L (ref 22–32)
CREATININE: 0.66 mg/dL (ref 0.44–1.00)
Glucose, Bld: 205 mg/dL — ABNORMAL HIGH (ref 70–99)
Potassium: 2.9 mmol/L — ABNORMAL LOW (ref 3.5–5.1)
Sodium: 141 mmol/L (ref 135–145)
Total Protein: 6.2 g/dL — ABNORMAL LOW (ref 6.5–8.1)

## 2017-07-30 LAB — I-STAT CHEM 8, ED
BUN: 4 mg/dL — ABNORMAL LOW (ref 8–23)
CREATININE: 0.5 mg/dL (ref 0.44–1.00)
Calcium, Ion: 1.02 mmol/L — ABNORMAL LOW (ref 1.15–1.40)
Chloride: 104 mmol/L (ref 98–111)
GLUCOSE: 199 mg/dL — AB (ref 70–99)
HCT: 30 % — ABNORMAL LOW (ref 36.0–46.0)
HEMOGLOBIN: 10.2 g/dL — AB (ref 12.0–15.0)
POTASSIUM: 2.7 mmol/L — AB (ref 3.5–5.1)
Sodium: 141 mmol/L (ref 135–145)
TCO2: 22 mmol/L (ref 22–32)

## 2017-07-30 LAB — URINALYSIS, ROUTINE W REFLEX MICROSCOPIC
BILIRUBIN URINE: NEGATIVE
Glucose, UA: NEGATIVE mg/dL
HGB URINE DIPSTICK: NEGATIVE
Ketones, ur: NEGATIVE mg/dL
Leukocytes, UA: NEGATIVE
Nitrite: NEGATIVE
PROTEIN: NEGATIVE mg/dL
Specific Gravity, Urine: 1.006 (ref 1.005–1.030)
pH: 6 (ref 5.0–8.0)

## 2017-07-30 LAB — GLUCOSE, CAPILLARY
GLUCOSE-CAPILLARY: 80 mg/dL (ref 70–99)
Glucose-Capillary: 146 mg/dL — ABNORMAL HIGH (ref 70–99)

## 2017-07-30 LAB — CBC
HEMATOCRIT: 34.4 % — AB (ref 36.0–46.0)
HEMOGLOBIN: 9.7 g/dL — AB (ref 12.0–15.0)
MCH: 23.3 pg — AB (ref 26.0–34.0)
MCHC: 28.2 g/dL — AB (ref 30.0–36.0)
MCV: 82.7 fL (ref 78.0–100.0)
Platelets: 294 10*3/uL (ref 150–400)
RBC: 4.16 MIL/uL (ref 3.87–5.11)
RDW: 22.9 % — ABNORMAL HIGH (ref 11.5–15.5)
WBC: 6.7 10*3/uL (ref 4.0–10.5)

## 2017-07-30 LAB — RAPID URINE DRUG SCREEN, HOSP PERFORMED
Amphetamines: NOT DETECTED
BENZODIAZEPINES: NOT DETECTED
Cocaine: NOT DETECTED
Opiates: NOT DETECTED
Tetrahydrocannabinol: NOT DETECTED

## 2017-07-30 LAB — CBG MONITORING, ED: GLUCOSE-CAPILLARY: 195 mg/dL — AB (ref 70–99)

## 2017-07-30 LAB — I-STAT TROPONIN, ED: TROPONIN I, POC: 0 ng/mL (ref 0.00–0.08)

## 2017-07-30 LAB — ETHANOL

## 2017-07-30 LAB — APTT: APTT: 28 s (ref 24–36)

## 2017-07-30 LAB — PROTIME-INR
INR: 1.07
Prothrombin Time: 13.8 seconds (ref 11.4–15.2)

## 2017-07-30 MED ORDER — ACETAMINOPHEN 650 MG RE SUPP: 650.0000 mg | RECTAL | Status: DC | PRN

## 2017-07-30 MED ORDER — ASPIRIN EC 81 MG PO TBEC
81.0000 mg | DELAYED_RELEASE_TABLET | Freq: Every day | ORAL | Status: DC
  Administered 2017-07-31: 81 mg via ORAL
  Filled 2017-07-30 (×2): qty 1

## 2017-07-30 MED ORDER — ENOXAPARIN SODIUM 40 MG/0.4ML ~~LOC~~ SOLN
40.0000 mg | SUBCUTANEOUS | Status: DC
  Administered 2017-07-30: 40 mg via SUBCUTANEOUS
  Filled 2017-07-30: qty 0.4

## 2017-07-30 MED ORDER — GABAPENTIN 800 MG PO TABS
800.0000 mg | ORAL_TABLET | Freq: Two times a day (BID) | ORAL | Status: DC
  Filled 2017-07-30: qty 1

## 2017-07-30 MED ORDER — GABAPENTIN 400 MG PO CAPS
800.0000 mg | ORAL_CAPSULE | Freq: Two times a day (BID) | ORAL | Status: DC
  Administered 2017-07-31 – 2017-08-03 (×8): 800 mg via ORAL
  Filled 2017-07-30 (×8): qty 2

## 2017-07-30 MED ORDER — DULOXETINE HCL 60 MG PO CPEP
60.0000 mg | ORAL_CAPSULE | Freq: Every day | ORAL | Status: DC
  Administered 2017-07-31 – 2017-08-02 (×4): 60 mg via ORAL
  Filled 2017-07-30 (×5): qty 1

## 2017-07-30 MED ORDER — SODIUM CHLORIDE 0.9 % IV SOLN
INTRAVENOUS | Status: DC
  Administered 2017-07-30 – 2017-08-01 (×3): via INTRAVENOUS

## 2017-07-30 MED ORDER — UMECLIDINIUM BROMIDE 62.5 MCG/INH IN AEPB
1.0000 | INHALATION_SPRAY | Freq: Every day | RESPIRATORY_TRACT | Status: DC
  Administered 2017-07-31 – 2017-08-03 (×4): 1 via RESPIRATORY_TRACT
  Filled 2017-07-30: qty 7

## 2017-07-30 MED ORDER — PANTOPRAZOLE SODIUM 40 MG PO TBEC
40.0000 mg | DELAYED_RELEASE_TABLET | Freq: Two times a day (BID) | ORAL | Status: DC
  Administered 2017-07-31 – 2017-08-03 (×8): 40 mg via ORAL
  Filled 2017-07-30 (×9): qty 1

## 2017-07-30 MED ORDER — LORAZEPAM 2 MG/ML IJ SOLN
1.0000 mg | Freq: Once | INTRAMUSCULAR | Status: AC
  Administered 2017-07-30: 1 mg via INTRAVENOUS
  Filled 2017-07-30: qty 1

## 2017-07-30 MED ORDER — ACETAMINOPHEN 325 MG PO TABS
650.0000 mg | ORAL_TABLET | ORAL | Status: DC | PRN
  Administered 2017-07-31 – 2017-08-01 (×3): 650 mg via ORAL
  Filled 2017-07-30 (×3): qty 2

## 2017-07-30 MED ORDER — FLUTICASONE PROPIONATE 50 MCG/ACT NA SUSP
1.0000 | Freq: Every day | NASAL | Status: DC
  Administered 2017-07-31 – 2017-08-03 (×4): 1 via NASAL
  Filled 2017-07-30: qty 16

## 2017-07-30 MED ORDER — ATORVASTATIN CALCIUM 80 MG PO TABS
80.0000 mg | ORAL_TABLET | Freq: Every day | ORAL | Status: DC
  Administered 2017-07-30 – 2017-08-02 (×4): 80 mg via ORAL
  Filled 2017-07-30: qty 4
  Filled 2017-07-30: qty 8
  Filled 2017-07-30 (×2): qty 4
  Filled 2017-07-30 (×2): qty 8
  Filled 2017-07-30: qty 4
  Filled 2017-07-30: qty 8

## 2017-07-30 MED ORDER — SUCRALFATE 1 G PO TABS
1.0000 g | ORAL_TABLET | Freq: Three times a day (TID) | ORAL | Status: DC
  Administered 2017-07-30 – 2017-08-03 (×15): 1 g via ORAL
  Filled 2017-07-30 (×13): qty 1

## 2017-07-30 MED ORDER — SENNOSIDES-DOCUSATE SODIUM 8.6-50 MG PO TABS
1.0000 | ORAL_TABLET | Freq: Every evening | ORAL | Status: DC | PRN
  Filled 2017-07-30: qty 1

## 2017-07-30 MED ORDER — LEVOTHYROXINE SODIUM 112 MCG PO TABS
112.0000 ug | ORAL_TABLET | Freq: Every day | ORAL | Status: DC
  Administered 2017-07-31 – 2017-08-03 (×4): 112 ug via ORAL
  Filled 2017-07-30 (×6): qty 1

## 2017-07-30 MED ORDER — ACETAMINOPHEN 160 MG/5ML PO SOLN: 650.0000 mg | ORAL | Status: DC | PRN

## 2017-07-30 MED ORDER — LEVETIRACETAM 500 MG PO TABS
500.0000 mg | ORAL_TABLET | Freq: Two times a day (BID) | ORAL | Status: DC
  Administered 2017-07-31 – 2017-08-03 (×8): 500 mg via ORAL
  Filled 2017-07-30 (×9): qty 1

## 2017-07-30 MED ORDER — FERROUS SULFATE 325 (65 FE) MG PO TABS
325.0000 mg | ORAL_TABLET | Freq: Two times a day (BID) | ORAL | Status: DC
  Administered 2017-07-30 – 2017-08-01 (×4): 325 mg via ORAL
  Filled 2017-07-30 (×5): qty 1

## 2017-07-30 MED ORDER — STROKE: EARLY STAGES OF RECOVERY BOOK
Freq: Once | Status: AC
  Administered 2017-07-31
  Filled 2017-07-30: qty 1

## 2017-07-30 MED ORDER — INSULIN ASPART 100 UNIT/ML ~~LOC~~ SOLN
0.0000 [IU] | Freq: Three times a day (TID) | SUBCUTANEOUS | Status: DC
  Administered 2017-07-31 (×3): 2 [IU] via SUBCUTANEOUS
  Administered 2017-08-01: 3 [IU] via SUBCUTANEOUS
  Administered 2017-08-02 (×3): 2 [IU] via SUBCUTANEOUS
  Administered 2017-08-03: 3 [IU] via SUBCUTANEOUS
  Administered 2017-08-03: 2 [IU] via SUBCUTANEOUS

## 2017-07-30 NOTE — H&P (Addendum)
History and Physical    Samantha Aguirre XTG:[id number] DOB: 1945-08-24 DOA: 07/30/2017  PCP: Tracie Harrier, MD Consultants:  [facility] - cardiology; Vicente Males- GI Patient coming from: Home - lives alone; NOK: Daughter, [phone]  Chief Complaint:  Facial droop  HPI: Samantha Aguirre is a 72 y.o. female with medical history significant of CVA; HTN; HLD; CAD; DM; COPD; diastolic CHF; and afib on Coumadin presenting with code stroke.  She was recently hospitalized at [facility] for CVA.  She started noticing mouth drooping and creepy feeling and she's had it before.  The mouth droop started this AM.  Some difficulties with speech, word searching and expressive aphasia.  No extremity symptoms. No dysphagia.  +headache, frontal - it is still present.  She thought she was doing pretty well after her last hospitalization.  She was admitted at [facility] in March 2019 for COPD exacerbation and developed an acute stroke while hospitalized.  She returned to [facility] on 6/24 for facial droop and weakness.  She was evaluated for recurrent stroke, which appears to have been negative.  During that hospitalization, she had severe microcytic anemia and was thought to be have bleed.  She returned to the ER for melena on 7/3 and was discharged with stable Hgb.  She returned to the ER on 7/10 for chest pain and LE edema; her exam was not suggestive of volume overload.   ED Course:  H/o CVA, weakness. NIH 4, no tPA.  Recently admitted at [facility] for CVA.  Possible recrudescence.  Neurology has requested admission.  Review of Systems: As per HPI; otherwise review of systems reviewed and negative.   Ambulatory Status:  Ambulates with a cane or walker  Past Medical History:  Diagnosis Date  . Acid reflux   . Arthritis    Bilateral knees, hands, feet  . Asthma   . Atrial fibrillation ([place])   . Cancer ([place])   . CHF (congestive heart failure) (HCC)    Diastolic CHF  . COPD (chronic obstructive pulmonary  disease) ([place])   . Coronary artery disease   . Diabetes mellitus without complication ([place])   . Frequent headaches   . GI bleed   . HLD (hyperlipidemia)   . Hypertension   . Seizures ([place])   . Skin cancer 2015   Suspected basal cell carcinoma on nose, surgically removed  . Stroke ([place]) 04/2017  . Thyroid disease   . Tremors of nervous system     Past Surgical History:  Procedure Laterality Date  . ABDOMINAL HYSTERECTOMY  1976  . APPENDECTOMY  1980  . CAROTID ENDARTERECTOMY    . CHOLECYSTECTOMY  1980  . COLONOSCOPY N/A 02/20/2017   Procedure: COLONOSCOPY;  Surgeon: Lin Landsman, MD;  Location: [facility] ENDOSCOPY;  Service: Gastroenterology;  Laterality: N/A;  . ESOPHAGOGASTRODUODENOSCOPY N/A 02/20/2017   Procedure: ESOPHAGOGASTRODUODENOSCOPY (EGD);  Surgeon: Lin Landsman, MD;  Location: [facility] ENDOSCOPY;  Service: Gastroenterology;  Laterality: N/A;  . ESOPHAGOGASTRODUODENOSCOPY (EGD) WITH PROPOFOL N/A 07/08/2017   Procedure: ESOPHAGOGASTRODUODENOSCOPY (EGD) WITH PROPOFOL;  Surgeon: [name], MD;  Location: [facility] ENDOSCOPY;  Service: Endoscopy;  Laterality: N/A;  . [place]  . GIVENS CAPSULE STUDY N/A 07/09/2017   Procedure: GIVENS CAPSULE STUDY;  Surgeon: Jonathon Bellows, MD;  Location: [facility] ENDOSCOPY;  Service: Gastroenterology;  Laterality: N/A;  . KNEE SURGERY     left  . ROTATOR CUFF REPAIR     right  . TONSILLECTOMY  1950    Social History   Socioeconomic History  . Marital  status: Divorced    Spouse name: Not on file  . Number of children: 2  . Years of education: 80  . Highest education level: 12th grade  Occupational History  . Occupation: Disabled  Social Needs  . Financial resource strain: Somewhat hard  . Food insecurity:    Worry: Sometimes true    Inability: Sometimes true  . Transportation needs:    Medical: No    Non-medical: No  Tobacco Use  . Smoking status: Current Every Day Smoker    Packs/day: 1.00    Years: 60.00     Pack years: 60.00    Types: Cigarettes  . Smokeless tobacco: Never Used  Substance and Sexual Activity  . Alcohol use: No  . Drug use: No  . Sexual activity: Never  Lifestyle  . Physical activity:    Days per week: 2 days    Minutes per session: 20 min  . Stress: Rather much  Relationships  . Social connections:    Talks on phone: More than three times a week    Gets together: More than three times a week    Attends religious service: Never    Active member of club or organization: No    Attends meetings of clubs or organizations: Never    Relationship status: Divorced  . Intimate partner violence:    Fear of current or ex partner: No    Emotionally abused: No    Physically abused: No    Forced sexual activity: No  Other Topics Concern  . Not on file  Social History Narrative  . Not on file    Allergies  Allergen Reactions  . Mushroom Extract Complex Anaphylaxis    Made patient "deathly sick"  . Atenolol     "MD took me off of it bc it was doing something wrong" Made patient HYPOTENSIVE  . Ivp Dye [Iodinated Diagnostic Agents] Itching    Pt injected with IV contrast.  5 min after injection pt complained of itching behind ear and on her abd.  . Morphine And Related Other (See Comments)    "stops my breathing" NEAR-RESPIRATORY ARREST  . Percocet [Oxycodone-Acetaminophen] Nausea And Vomiting and Other (See Comments)    Stomach pains  . Percodan [Oxycodone-Aspirin] Nausea And Vomiting and Other (See Comments)    Stomach pains  . Verapamil Other (See Comments)    " MD told me this was screwing me up" MAKES PATIENT HYPOTENSIVE    Family History  Problem Relation Age of Onset  . Breast cancer Mother   . Heart disease Mother   . Stroke Mother   . Cancer Mother   . COPD Mother   . Diabetes Mother   . Heart disease Father   . Diabetes Father   . Stroke Father   . Alcohol abuse Sister   . Drug abuse Sister   . Stroke Sister   . Cancer Sister   . Mental illness  Sister   . Heart disease Brother   . Arthritis Brother   . Diabetes Brother   . Heart disease Maternal Grandfather   . Heart disease Paternal Grandfather     Prior to Admission medications   Medication Sig Start Date End Date Taking? Authorizing Provider  albuterol (PROVENTIL HFA;VENTOLIN HFA) 108 (90 Base) MCG/ACT inhaler Inhale 2 puffs into the lungs every 6 (six) hours as needed for wheezing or shortness of breath. 10/08/16   Karamalegos, Devonne Doughty, DO  aspirin EC 81 MG tablet Take 1 tablet (81 mg  total) by mouth daily. 07/10/17 07/10/18  Vaughan Basta, MD  atorvastatin (LIPITOR) 80 MG tablet Take 1 tablet (80 mg total) by mouth daily at 6 PM. 09/24/16   Karamalegos, Devonne Doughty, DO  diclofenac sodium (VOLTAREN) 1 % GEL Apply 2 g topically 4 (four) times daily. 01/11/16   Daymon Larsen, MD  DULoxetine (CYMBALTA) 60 MG capsule Take 1 capsule (60 mg total) by mouth at bedtime. 09/24/16   Karamalegos, Devonne Doughty, DO  ferrous sulfate 325 (65 FE) MG EC tablet Take 1 tablet (325 mg total) by mouth 2 (two) times daily with a meal. 02/21/17   Sudini, Srikar, MD  fluticasone (FLONASE) 50 MCG/ACT nasal spray Place 1 spray into both nostrils daily. 10/08/16   Karamalegos, Devonne Doughty, DO  furosemide (LASIX) 20 MG tablet Take 10 mg by mouth daily.     Karamalegos, Alexander J, DO  gabapentin (NEURONTIN) 800 MG tablet Take 1 tablet (800 mg total) by mouth 2 (two) times daily. 09/24/16   Karamalegos, Devonne Doughty, DO  glipiZIDE-metformin (METAGLIP) 5-500 MG tablet Take 1 tablet by mouth 2 (two) times daily before a meal. 04/02/17   Karamalegos, Devonne Doughty, DO  levETIRAcetam (KEPPRA) 500 MG tablet Take 1 tablet (500 mg total) by mouth 2 (two) times daily. 09/19/15   Epifanio Lesches, MD  levothyroxine (SYNTHROID, LEVOTHROID) 175 MCG tablet Take 1 tablet (175 mcg total) by mouth daily before breakfast. Patient taking differently: Take 112 mcg by mouth daily before breakfast.  09/24/16   Karamalegos,  Devonne Doughty, DO  nitroGLYCERIN (NITROSTAT) 0.4 MG SL tablet Place 1 tablet (0.4 mg total) under the tongue every 5 (five) minutes as needed for chest pain. 02/28/16   Bettey Costa, MD  omeprazole (PRILOSEC) 20 MG capsule Take 20 mg by mouth 2 (two) times daily. Total 40 mg twice daily    [provider]  potassium chloride (MICRO-K) 10 MEQ CR capsule Take 1 capsule (10 mEq total) by mouth daily. Patient taking differently: Take 10 mEq by mouth 2 (two) times daily.  09/24/16   Karamalegos, Devonne Doughty, DO  sucralfate (CARAFATE) 1 g tablet TAKE (1) TABLET BY MOUTH FOUR TIMES A DAY 03/26/17   Karamalegos, Devonne Doughty, DO  umeclidinium bromide (INCRUSE ELLIPTA) 62.5 MCG/INH AEPB Inhale 1 puff into the lungs daily. 11/13/15   Karamalegos, Devonne Doughty, DO  vitamin B-12 (CYANOCOBALAMIN) 1000 MCG tablet Take 1 tablet by mouth daily. 06/29/17   [provider]    Physical Exam: Vitals:   07/30/17 1205 07/30/17 1210 07/30/17 1215 07/30/17 1325  BP: (!) 106/57 (!) 116/58 101/60 (!) 113/56  Pulse: 62 66 60 69  Resp: 14 (!) 24 (!) 24 14  Temp:      TempSrc:      SpO2: 99% 97% 98% 98%  Weight:      Height:         General:  Appears calm and comfortable and is NAD Eyes:  PERRL, EOMI, normal lids, iris ENT:  grossly normal hearing, lips & tongue, mmm; left facial droop in V3 distribution noted Neck:  no LAD, masses or thyromegaly; no carotid bruits Cardiovascular:  RRR, no m/r/g. No LE edema.  Respiratory:   CTA bilaterally with no wheezes/rales/rhonchi.  Normal respiratory effort. Abdomen:  soft, NT, ND, NABS Skin:  no rash or induration seen on limited exam Musculoskeletal:  LUE > RUE weakness, 3/5 LLE strength. Lower extremity:  No LE edema.  Limited foot exam with no ulcerations.  2+ distal pulses. Psychiatric:  blunted mood and affect, speech fluent and appropriate, AOx3 Neurologic: left facial droop, decreased sensation of entire left face and left hand   Radiological Exams  on Admission: Mr Virgel Paling EP Contrast  Result Date: 07/30/2017 CLINICAL DATA:  Acute onset LEFT-sided weakness during physical therapy. Similar symptoms a few weeks ago due to thalamic infarct. Assess for stroke. History of hypertension, hyperlipidemia, seizures, skin cancer, carotid endarterectomy. EXAM: MRI HEAD WITHOUT CONTRAST MRA HEAD WITHOUT CONTRAST TECHNIQUE: Multiplanar, multiecho pulse sequences of the brain and surrounding structures were obtained without intravenous contrast. Angiographic images of the head were obtained using MRA technique without contrast. COMPARISON:  MRI head July 06, 2017 and MRA head April 06, 2017. FINDINGS: MRI HEAD FINDINGS INTRACRANIAL CONTENTS: 5 mm reduced diffusion with low ADC values LEFT posterior limb of the internal capsule. Patchy T2 shine through LEFT temporoparietal lobes with encephalomalacia and associated unchanged susceptibility artifact. No susceptibility artifact to suggest interval hemorrhage. Old bilateral small cerebellar infarcts. Moderate general parenchymal brain volume loss without hydrocephalus. Patchy to confluent supratentorial pontine white matter FLAIR T2 hyperintensities. No midline shift, mass effect. No abnormal extra-axial fluid collections. 1 cm RIGHT convexity low signal meningioma without mass effect. VASCULAR: Normal major intracranial vascular flow voids present at skull base. SKULL AND UPPER CERVICAL SPINE: No abnormal sellar expansion. No suspicious calvarial bone marrow signal. Craniocervical junction maintained. SINUSES/ORBITS: The mastoid air-cells and included paranasal sinuses are well-aerated.The included ocular globes and orbital contents are non-suspicious. OTHER: None. MRA HEAD FINDINGS ANTERIOR CIRCULATION: Normal flow related enhancement of the included cervical, petrous, cavernous and supraclinoid internal carotid arteries. 2 mm laterally directed outpouching LEFT cavernous ICA. Patent anterior communicating artery. Patent  anterior and middle cerebral arteries. Less conspicuous luminal irregularity of the middle cerebral arteries, prior stenosis attributable to motion artifact. No large vessel occlusion, flow limiting stenosis. POSTERIOR CIRCULATION: RIGHT vertebral artery is dominant. Vertebrobasilar arteries are patent. RIGHT posterior inferior cerebellar artery not visualized though this may be technical. Patent posterior cerebral arteries. Bilateral posterior communicating arteries present. Fetal origin LEFT posterior cerebral artery. No large vessel occlusion, flow limiting stenosis,  aneurysm. ANATOMIC VARIANTS: None. Source images and MIP images were reviewed. IMPRESSION: MRI HEAD: 1. Acute 5 mm LEFT internal capsule infarct. 2. Old LEFT MCA territory infarct. Old RIGHT thalamus lacunar infarct. Multiple old small cerebellar infarcts. 3. Stable moderate parenchymal brain volume loss and moderate chronic small vessel ischemic changes. MRA HEAD: 1. No emergent large vessel occlusion or flow-limiting stenosis. 2. Previously reported stenosis not apparent today; prior findings attributable to motion artifact. 3. 2 mm LEFT cavernous aneurysm versus infundibulum. Electronically Signed   By: Elon Alas M.D.   On: 07/30/2017 14:26   [name]  Result Date: 07/30/2017 CLINICAL DATA:  Acute onset LEFT-sided weakness during physical therapy. Similar symptoms a few weeks ago due to thalamic infarct. Assess for stroke. History of hypertension, hyperlipidemia, seizures, skin cancer, carotid endarterectomy. EXAM: MRI HEAD WITHOUT CONTRAST MRA HEAD WITHOUT CONTRAST TECHNIQUE: Multiplanar, multiecho pulse sequences of the brain and surrounding structures were obtained without intravenous contrast. Angiographic images of the head were obtained using MRA technique without contrast. COMPARISON:  MRI head July 06, 2017 and MRA head April 06, 2017. FINDINGS: MRI HEAD FINDINGS INTRACRANIAL CONTENTS: 5 mm reduced diffusion with  low ADC values LEFT posterior limb of the internal capsule. Patchy T2 shine through LEFT temporoparietal lobes with encephalomalacia and associated unchanged susceptibility artifact. No susceptibility artifact to suggest interval hemorrhage. Old bilateral small cerebellar infarcts. Moderate  general parenchymal brain volume loss without hydrocephalus. Patchy to confluent supratentorial pontine white matter FLAIR T2 hyperintensities. No midline shift, mass effect. No abnormal extra-axial fluid collections. 1 cm RIGHT convexity low signal meningioma without mass effect. VASCULAR: Normal major intracranial vascular flow voids present at skull base. SKULL AND UPPER CERVICAL SPINE: No abnormal sellar expansion. No suspicious calvarial bone marrow signal. Craniocervical junction maintained. SINUSES/ORBITS: The mastoid air-cells and included paranasal sinuses are well-aerated.The included ocular globes and orbital contents are non-suspicious. OTHER: None. MRA HEAD FINDINGS ANTERIOR CIRCULATION: Normal flow related enhancement of the included cervical, petrous, cavernous and supraclinoid internal carotid arteries. 2 mm laterally directed outpouching LEFT cavernous ICA. Patent anterior communicating artery. Patent anterior and middle cerebral arteries. Less conspicuous luminal irregularity of the middle cerebral arteries, prior stenosis attributable to motion artifact. No large vessel occlusion, flow limiting stenosis. POSTERIOR CIRCULATION: RIGHT vertebral artery is dominant. Vertebrobasilar arteries are patent. RIGHT posterior inferior cerebellar artery not visualized though this may be technical. Patent posterior cerebral arteries. Bilateral posterior communicating arteries present. Fetal origin LEFT posterior cerebral artery. No large vessel occlusion, flow limiting stenosis,  aneurysm. ANATOMIC VARIANTS: None. Source images and MIP images were reviewed. IMPRESSION: MRI HEAD: 1. Acute 5 mm LEFT internal capsule infarct.  2. Old LEFT MCA territory infarct. Old RIGHT thalamus lacunar infarct. Multiple old small cerebellar infarcts. 3. Stable moderate parenchymal brain volume loss and moderate chronic small vessel ischemic changes. MRA HEAD: 1. No emergent large vessel occlusion or flow-limiting stenosis. 2. Previously reported stenosis not apparent today; prior findings attributable to motion artifact. 3. 2 mm LEFT cavernous aneurysm versus infundibulum. Electronically Signed   By: Elon Alas M.D.   On: 07/30/2017 14:26   Ct Head Code Stroke Wo Contrast  Result Date: 07/30/2017 CLINICAL DATA:  Code stroke. 72 year old female with left facial droop and loss of extremity sensation. Prior left MCA/PCA watershed infarct. EXAM: CT HEAD WITHOUT CONTRAST TECHNIQUE: Contiguous axial images were obtained from the base of the skull through the vertex without intravenous contrast. COMPARISON:  Brain MRI and head CT 07/06/2017 and earlier. FINDINGS: Brain: Chronic posterior left hemisphere encephalomalacia is stable. Small chronic cerebellar infarcts redemonstrated and stable. Patchy additional bilateral cerebral white matter hypodensity with involvement of the anterior deep white matter capsules appears stable. No midline shift, ventriculomegaly, mass effect, evidence of mass lesion, intracranial hemorrhage or evidence of cortically based acute infarction. Vascular: Calcified atherosclerosis at the skull base. No suspicious intracranial vascular hyperdensity. Skull: Stable, negative. Sinuses/Orbits: Visualized paranasal sinuses and mastoids are stable and well pneumatized. Other: No acute orbit or scalp soft tissue findings. ASPECTS [facility] Stroke Program Early CT Score) - Ganglionic level infarction (caudate, lentiform nuclei, internal capsule, insula, M1-M3 cortex): 7 - Supraganglionic infarction (M4-M6 cortex): 3 Total score (0-10 with 10 being normal): 10 IMPRESSION: 1. No acute intracranial abnormality. Chronic ischemic disease  appears stable by CT since 07/06/2017. 2. ASPECTS is 10. 3. These results were communicated to Dr. Rory Percy at 10:31 amon 7/18/2019by text page via the [facility] messaging system. Electronically Signed   By: Genevie Ann M.D.   On: 07/30/2017 10:31    EKG: Independently reviewed.  NSR with rate 64; nonspecific ST changes with no evidence of acute ischemia   Labs on Admission: I have personally reviewed the available labs and imaging studies at the time of the admission.  Pertinent labs:   K+ 2.9 Glucose 199 Troponin 0.00 WBC 6.7 Hgb 9.7 INR 1.07 ETOH <10  Assessment/Plan Principal Problem:   CVA (cerebral  vascular accident) [facility]) Active Problems:   Other specified hypothyroidism   Controlled type 2 diabetes mellitus with diabetic neuropathy (HCC)   COPD (chronic obstructive pulmonary disease) (HCC)   Tobacco abuse   HLD (hyperlipidemia)   Hypertension   Anemia   CVA -Symptoms and facial droop concerning for recurrent CVA -MRI confirms acute stroke -Will admit for further CVA evaluation -Telemetry monitoring -MRI/MRA completed -Carotid dopplers done in 3/19 with <50% stenosis of B carotids -Echo normal on 04/06/17 -She appears to have been taking ASA daily as monotherapy; will need DAPT and transition to Plavix -She may need anticoagulation  -PT/OT/ST/Nutrition Consults -SW consult for placement - with her frequent recent ER visits and now new stroke, she may not be appropriate for ongoing independent living  HTN -Allow permissive HTN -Treat BP only if >220/120, and then with goal of 15% reduction -She is not currently taking any BP medications  HLD -6/25 lipids: 123/34/56/164 -Continue Lipitor 80 mg daily  DM -6/25 A1c was 6.2, indicating good control -hold Glucophage/glipzide -Cover with moderate-scale SSI  Anemia -Hgb 9.7, which appears to be around baseline -Normocytic -Continue iron supplementation  COPD -Continue Incruse Ellipta -Currently without evidence of  exacerbation  Hypothyroidism -TSH in 2/19 was  Normal -Continue Synthroid at current dose for now  Tobacco dependence -Encourage cessation.  This was discussed with the patient and should be reviewed on an ongoing basis.   -Patch declined  Seizure d/o -Continue Keppra  DVT prophylaxis:  Lovenox Code Status: DNR - confirmed with patient Family Communication: None present Disposition Plan: To be determined Consults called: Neurology; PT/OT/ST/Nutrition/SW Admission status: Admit - It is my clinical opinion that admission to INPATIENT is reasonable and necessary because of the expectation that this patient will require hospital care that crosses at least 2 midnights to treat this condition based on the medical complexity of the problems presented.  Given the aforementioned information, the predictability of an adverse outcome is felt to be significant.    Karmen Bongo MD [facility]  If note is complete, please contact covering daytime or nighttime physician. [email or web address] Password [facility]  07/30/2017, 2:43 PM

## 2017-07-30 NOTE — Progress Notes (Signed)
Pt arrived to unit

## 2017-07-30 NOTE — Code Documentation (Signed)
72 yo female coming from home where she was being evaluated by Home PT. Pt was walking outside with the PT when she started to have left sided weakness and left facial droop. EMS was called and EMS activated a Code Stroke in the field. Stroke Team met patient upon arrival. Initial NIHSS 6 due to dysarthria, facial droop, left arm weakness, left leg weakness, and decreased sensory on the left. CT Head completed - Negative. Pt is allergic to IV contrast - unable to get CTA. No tPA due to patient reporting hx of GI bleed in the last couple months with significant dark red stool in the last two weeks. Repeat NIHSS 4 due to left leg drift, decreased sensory at patient's baseline, facial droop, and dysarthria. Patient is in the window until 1400. Pt will continue to have close evaluation. Handoff given to Santiago Glad, Therapist, sports.

## 2017-07-30 NOTE — ED Provider Notes (Signed)
[facility] Provider Note   CSN: [id number] Arrival date & time: 07/30/17  1004     History   Chief Complaint Chief Complaint  Patient presents with  . Code Stroke    HPI Geneive Sandstrom is a 72 y.o. female.  HPI Presents as a code stroke. The patient was at rehabilitation, in her usual state of health including baseline left-sided deficit normal but, when she felt onset of left-sided weakness, within the past hours, 0930 was last seen normal time. Since onset symptoms have been persistent.  Has a notable history of recent stroke, seen at our affiliated [facility], did not receive TPA. Patient has multiple other medical issues, including atrial fibrillation, does not take blood thinning medication.  History provided by patient, chart review, with some limitation given the patient's acuity of condition, dysarthria.   Past Medical History:  Diagnosis Date  . Acid reflux   . Arthritis    Bilateral knees, hands, feet  . Asthma   . Atrial fibrillation ([place])   . Cancer ([place])   . Chest pain   . CHF (congestive heart failure) (HCC)    Diastolic CHF  . COPD (chronic obstructive pulmonary disease) ([place])   . Coronary artery disease   . Diabetes mellitus without complication ([place])   . Frequent headaches   . GI bleed   . Heart attack ([place])   . High cholesterol   . HLD (hyperlipidemia)   . Hypertension   . MI (myocardial infarction) ([place])   . Seizures ([place])   . Skin cancer 2015   Suspected basal cell carcinoma on nose, surgically removed  . Stroke ([place])   . Thyroid disease   . Tremors of nervous system     Patient Active Problem List   Diagnosis Date Noted  . GI bleed 07/08/2017  . Anemia 07/08/2017  . Angiodysplasia of stomach and duodenum with hemorrhage   . Stroke (cerebrum) ([place])   . Goals of care, counseling/discussion   . Palliative care encounter   . COPD exacerbation ([place]) 04/03/2017  . Flu 03/27/2017  . Iron  deficiency anemia due to chronic blood loss   . Migraine 02/13/2017  . Cataracts, bilateral 01/15/2017  . Chronic venous insufficiency 10/06/2016  . Recurrent major depressive disorder, in partial remission ([place]) 10/06/2016  . Left-sided weakness 10/03/2016  . Chest pain 02/27/2016  . History of cerebrovascular accident (CVA) with residual deficit 02/19/2016  . Thumb pain, left 01/22/2016  . Traumatic closed nondisplaced fracture of base of metacarpal bone of left thumb 01/22/2016  . Colon cancer screening 11/27/2015  . Depression 11/14/2015  . Coronary artery disease 11/13/2015  . Hypertension 11/13/2015  . History of skin cancer 11/13/2015  . Osteoporosis 11/13/2015  . Chronic low back pain 11/13/2015  . Left medial knee pain 11/13/2015  . Osteoarthritis of multiple joints 11/13/2015  . Other specified hypothyroidism 08/16/2015  . Controlled type 2 diabetes mellitus with diabetic neuropathy ([place]) 08/16/2015  . COPD (chronic obstructive pulmonary disease) ([place]) 08/16/2015  . Tobacco abuse 08/16/2015  . HLD (hyperlipidemia) 08/16/2015  . History of CHF (congestive heart failure) 08/16/2015  . Seizures ([place])     Past Surgical History:  Procedure Laterality Date  . ABDOMINAL HYSTERECTOMY  1976  . APPENDECTOMY  1980  . CAROTID ENDARTERECTOMY    . CHOLECYSTECTOMY  1980  . COLONOSCOPY N/A 02/20/2017   Procedure: COLONOSCOPY;  Surgeon: Lin Landsman, MD;  Location: [facility] ENDOSCOPY;  Service: Gastroenterology;  Laterality: N/A;  .  ESOPHAGOGASTRODUODENOSCOPY N/A 02/20/2017   Procedure: ESOPHAGOGASTRODUODENOSCOPY (EGD);  Surgeon: Lin Landsman, MD;  Location: [facility] ENDOSCOPY;  Service: Gastroenterology;  Laterality: N/A;  . ESOPHAGOGASTRODUODENOSCOPY (EGD) WITH PROPOFOL N/A 07/08/2017   Procedure: ESOPHAGOGASTRODUODENOSCOPY (EGD) WITH PROPOFOL;  Surgeon: [name], MD;  Location: [facility] ENDOSCOPY;  Service: Endoscopy;  Laterality: N/A;  . [place]  . GIVENS  CAPSULE STUDY N/A 07/09/2017   Procedure: GIVENS CAPSULE STUDY;  Surgeon: Jonathon Bellows, MD;  Location: [facility] ENDOSCOPY;  Service: Gastroenterology;  Laterality: N/A;  . KNEE SURGERY     left  . ROTATOR CUFF REPAIR     right  . TONSILLECTOMY  1950     OB History   None      Home Medications    Prior to Admission medications   Medication Sig Start Date End Date Taking? Authorizing Provider  albuterol (PROVENTIL HFA;VENTOLIN HFA) 108 (90 Base) MCG/ACT inhaler Inhale 2 puffs into the lungs every 6 (six) hours as needed for wheezing or shortness of breath. 10/08/16   Karamalegos, [facility] Doughty, DO  aspirin EC 81 MG tablet Take 1 tablet (81 mg total) by mouth daily. 07/10/17 07/10/18  Vaughan Basta, MD  atorvastatin (LIPITOR) 80 MG tablet Take 1 tablet (80 mg total) by mouth daily at 6 PM. 09/24/16   Karamalegos, [facility] Doughty, DO  diclofenac sodium (VOLTAREN) 1 % GEL Apply 2 g topically 4 (four) times daily. 01/11/16   Daymon Larsen, MD  DULoxetine (CYMBALTA) 60 MG capsule Take 1 capsule (60 mg total) by mouth at bedtime. 09/24/16   Karamalegos, [facility]ne Doughty, DO  ferrous sulfate 325 (65 FE) MG EC tablet Take 1 tablet (325 mg total) by mouth 2 (two) times daily with a meal. 02/21/17   Sudini, Srikar, MD  fluticasone (FLONASE) 50 MCG/ACT nasal spray Place 1 spray into both nostrils daily. 10/08/16   Karamalegos, [facility]ne Doughty, DO  furosemide (LASIX) 20 MG tablet Take 10 mg by mouth daily.     Karamalegos, Alexander J, DO  gabapentin (NEURONTIN) 800 MG tablet Take 1 tablet (800 mg total) by mouth 2 (two) times daily. 09/24/16   Karamalegos, [facility]ne Doughty, DO  glipiZIDE-metformin (METAGLIP) 5-500 MG tablet Take 1 tablet by mouth 2 (two) times daily before a meal. 04/02/17   Karamalegos, [facility]ne Doughty, DO  levETIRAcetam (KEPPRA) 500 MG tablet Take 1 tablet (500 mg total) by mouth 2 (two) times daily. 09/19/15   Epifanio Lesches, MD  levothyroxine (SYNTHROID, LEVOTHROID) 175 MCG tablet Take 1 tablet (175  mcg total) by mouth daily before breakfast. Patient taking differently: Take 112 mcg by mouth daily before breakfast.  09/24/16   Karamalegos, [facility] Doughty, DO  nitroGLYCERIN (NITROSTAT) 0.4 MG SL tablet Place 1 tablet (0.4 mg total) under the tongue every 5 (five) minutes as needed for chest pain. 02/28/16   Bettey Costa, MD  omeprazole (PRILOSEC) 20 MG capsule Take 20 mg by mouth 2 (two) times daily. Total 40 mg twice daily    [provider]  potassium chloride (MICRO-K) 10 MEQ CR capsule Take 1 capsule (10 mEq total) by mouth daily. Patient taking differently: Take 10 mEq by mouth 2 (two) times daily.  09/24/16   Karamalegos, [facility]ne Doughty, DO  sucralfate (CARAFATE) 1 g tablet TAKE (1) TABLET BY MOUTH FOUR TIMES A DAY 03/26/17   Karamalegos, [place]ne Doughty, DO  umeclidinium bromide (INCRUSE ELLIPTA) 62.5 MCG/INH AEPB Inhale 1 puff into the lungs daily. 11/13/15   Karamalegos, [facility]ne Doughty, DO  vitamin B-12 (CYANOCOBALAMIN) 1000 MCG  tablet Take 1 tablet by mouth daily. 06/29/17   [provider]    Family History Family History  Problem Relation Age of Onset  . Breast cancer Mother   . Heart disease Mother   . Stroke Mother   . Cancer Mother   . COPD Mother   . Diabetes Mother   . Heart disease Father   . Diabetes Father   . Stroke Father   . Alcohol abuse Sister   . Drug abuse Sister   . Stroke Sister   . Cancer Sister   . Mental illness Sister   . Heart disease Brother   . Arthritis Brother   . Diabetes Brother   . Heart disease Maternal Grandfather   . Heart disease Paternal Grandfather     Social History Social History   Tobacco Use  . Smoking status: Current Every Day Smoker    Packs/day: 1.00    Years: 50.00    Pack years: 50.00    Types: Cigarettes  . Smokeless tobacco: Never Used  Substance Use Topics  . Alcohol use: No  . Drug use: No     Allergies   Mushroom extract complex; Atenolol; Ivp dye [iodinated diagnostic agents]; Morphine and related;  Percocet [oxycodone-acetaminophen]; Percodan [oxycodone-aspirin]; and Verapamil   Review of Systems Review of Systems  Constitutional:       Per HPI, otherwise negative  HENT:       Per HPI, otherwise negative  Respiratory:       Per HPI, otherwise negative  Cardiovascular:       Per HPI, otherwise negative  Gastrointestinal: Negative for vomiting.  Endocrine:       Negative aside from HPI  Genitourinary:       Neg aside from HPI   Musculoskeletal:       Per HPI, otherwise negative  Skin: Negative.   Neurological: Positive for speech difficulty and weakness. Negative for syncope.     Physical Exam Updated Vital Signs BP 111/63   Pulse 71   Temp 98.5 F (36.9 C) (Oral)   Resp (!) 24   Ht 5\' 1"  (1.549 m)   Wt 83.3 kg (183 lb 10.3 oz)   SpO2 98%   BMI 34.70 kg/m   Physical Exam  Constitutional: She is oriented to person, place, and time. She appears well-developed and well-nourished. No distress.  HENT:  Head: Normocephalic and atraumatic.  Eyes: Conjunctivae and EOM are normal.  Cardiovascular: Normal rate and regular rhythm.  Pulmonary/Chest: Effort normal and breath sounds normal. No stridor. No respiratory distress.  Abdominal: She exhibits no distension.  Musculoskeletal: She exhibits no edema.  Neurological: She is alert and oriented to person, place, and time. A cranial nerve deficit is present.  Dysarthria Left upper arm pronator drift Left leg 4/5 strength  NIH 4  Skin: Skin is warm and dry.  Psychiatric: She has a normal mood and affect.  Nursing note and vitals reviewed.    ED Treatments / Results  Labs (all labs ordered are listed, but only abnormal results are displayed) Labs Reviewed  CBC - Abnormal; Notable for the following components:      Result Value   Hemoglobin 9.7 (*)    HCT 34.4 (*)    MCH 23.3 (*)    MCHC 28.2 (*)    RDW 22.9 (*)    All other components within normal limits  COMPREHENSIVE METABOLIC PANEL - Abnormal; Notable  for the following components:   Potassium 2.9 (*)  Glucose, Bld 205 (*)    BUN 6 (*)    Calcium 8.4 (*)    Total Protein 6.2 (*)    Albumin 3.4 (*)    All other components within normal limits  I-STAT CHEM 8, ED - Abnormal; Notable for the following components:   Potassium 2.7 (*)    BUN 4 (*)    Glucose, Bld 199 (*)    Calcium, Ion 1.02 (*)    Hemoglobin 10.2 (*)    HCT 30.0 (*)    All other components within normal limits  CBG MONITORING, ED - Abnormal; Notable for the following components:   Glucose-Capillary 195 (*)    All other components within normal limits  ETHANOL  PROTIME-INR  APTT  DIFFERENTIAL  RAPID URINE DRUG SCREEN, HOSP PERFORMED  URINALYSIS, ROUTINE W REFLEX MICROSCOPIC  I-STAT TROPONIN, ED    EKG None  Radiology Ct Head Code Stroke Wo Contrast  Result Date: 07/30/2017 CLINICAL DATA:  Code stroke. 72 year old female with left facial droop and loss of extremity sensation. Prior left MCA/PCA watershed infarct. EXAM: CT HEAD WITHOUT CONTRAST TECHNIQUE: Contiguous axial images were obtained from the base of the skull through the vertex without intravenous contrast. COMPARISON:  Brain MRI and head CT 07/06/2017 and earlier. FINDINGS: Brain: Chronic posterior left hemisphere encephalomalacia is stable. Small chronic cerebellar infarcts redemonstrated and stable. Patchy additional bilateral cerebral white matter hypodensity with involvement of the anterior deep white matter capsules appears stable. No midline shift, ventriculomegaly, mass effect, evidence of mass lesion, intracranial hemorrhage or evidence of cortically based acute infarction. Vascular: Calcified atherosclerosis at the skull base. No suspicious intracranial vascular hyperdensity. Skull: Stable, negative. Sinuses/Orbits: Visualized paranasal sinuses and mastoids are stable and well pneumatized. Other: No acute orbit or scalp soft tissue findings. ASPECTS [facility] Stroke Program Early CT Score) -  Ganglionic level infarction (caudate, lentiform nuclei, internal capsule, insula, M1-M3 cortex): 7 - Supraganglionic infarction (M4-M6 cortex): 3 Total score (0-10 with 10 being normal): 10 IMPRESSION: 1. No acute intracranial abnormality. Chronic ischemic disease appears stable by CT since 07/06/2017. 2. ASPECTS is 10. 3. These results were communicated to Dr. Rory Percy at 10:31 amon 7/18/2019by text page via the [facility] messaging system. Electronically Signed   By: Genevie Ann M.D.   On: 07/30/2017 10:31    Procedures Procedures (including critical care time)  Medications Ordered in ED Medications - No data to display   Initial Impression / Assessment and Plan / ED Course  I have reviewed the triage vital signs and the nursing notes.  Pertinent labs & imaging results that were available during my care of the patient were reviewed by me and considered in my medical decision making (see chart for details).     11:54 AM Patient in no distress. Findings reviewed and considered with the patient's recent evaluation for stroke at our affiliated facility. The patient has a patent airway, labs are generally reassuring aside from mild hypokalemia, and CT scan without hemorrhage. Patient has NIH of 4, though with new deficits, there is some concern for ongoing deficiency, possible new stroke. Patient not a candidate for TPA, per neurology, but does require admission for further evaluation and management.  Final Clinical Impressions(s) / ED Diagnoses  Dysarthria Weakness   Carmin Muskrat, MD 07/30/17 1155

## 2017-07-30 NOTE — ED Triage Notes (Signed)
Pt to ED via [facility] from home-- was getting home physical therapy-- PT noticed that pt had left facial droop. Left sided weakness also. See stroke chart

## 2017-07-30 NOTE — ED Notes (Signed)
Daughter -- Millena -- "J" [phone] notified of mother's condition.

## 2017-07-30 NOTE — ED Notes (Addendum)
Pt transported to MRI 

## 2017-07-30 NOTE — Consult Note (Addendum)
Requesting Physician: Dr. Vanita Panda Chief Complaint: Code stroke History obtained from:  Patient   chart  HPI:                                                                                                                                         Samantha Aguirre is an 72 y.o. female past medical history of hyperlipidemia, hypertension, diabetes, heart attack, atrial fibrillation.  Patient was recently seen on 04/06/2017 at [facility] for code stroke and then again in June 2019.  March 2019 -she had an NIH stroke scale of 13.  Symptoms at that time were left facial droop and left-sided weakness.  At that time she was on Eliquis and was not considered a TPA candidate.  Exam showed a left lower extremity 4/5 with decreased pinprick on the left side.  MRI Brain that was obtained at that time showed a right thalamic infarct and subacute left posterior temporal infarct.  Recently patient was taken off of anticoagulant secondary to GI bleed requiring blood transfusions. June 2019 - had LSW, taken to [facility], MRI negative for stroke.  Today patient was apparently working with physical therapy and last known normal was 9:30 AM.  They were walking her out to the car and they noticed that she had a left facial droop, left-sided weakness.  Patient at the bridge showed a NIH stroke scale of 4 due to left facial droop, left arm drift, left leg drift and decreased sensation on the left.  Date last known well: Date: 07/30/2017 Time last known well: Time: 09:30 tPA Given: No: low NIHSS, history of GI bleed and transfusions very recently NIH stroke scale of 4 Modified Rankin: Rankin Score=1   Past Medical History:  Diagnosis Date  . Acid reflux   . Arthritis    Bilateral knees, hands, feet  . Asthma   . Atrial fibrillation ([place])   . Cancer ([place])   . Chest pain   . CHF (congestive heart failure) (HCC)    Diastolic CHF  . COPD (chronic obstructive pulmonary disease) ([place])   . Coronary artery disease   . Diabetes  mellitus without complication ([place])   . Frequent headaches   . Heart attack ([place])   . High cholesterol   . HLD (hyperlipidemia)   . Hypertension   . MI (myocardial infarction) ([place])   . Seizures ([place])   . Skin cancer 2015   Suspected basal cell carcinoma on nose, surgically removed  . Stroke ([place])   . Thyroid disease   . Tremors of nervous system     Past Surgical History:  Procedure Laterality Date  . ABDOMINAL HYSTERECTOMY  1976  . APPENDECTOMY  1980  . CAROTID ENDARTERECTOMY    . CHOLECYSTECTOMY  1980  . COLONOSCOPY N/A 02/20/2017   Procedure: COLONOSCOPY;  Surgeon: Lin Landsman, MD;  Location: [facility] ENDOSCOPY;  Service: Gastroenterology;  Laterality: N/A;  . ESOPHAGOGASTRODUODENOSCOPY N/A  02/20/2017   Procedure: ESOPHAGOGASTRODUODENOSCOPY (EGD);  Surgeon: Lin Landsman, MD;  Location: [facility] ENDOSCOPY;  Service: Gastroenterology;  Laterality: N/A;  . ESOPHAGOGASTRODUODENOSCOPY (EGD) WITH PROPOFOL N/A 07/08/2017   Procedure: ESOPHAGOGASTRODUODENOSCOPY (EGD) WITH PROPOFOL;  Surgeon: [name], MD;  Location: [facility] ENDOSCOPY;  Service: Endoscopy;  Laterality: N/A;  . [place]  . GIVENS CAPSULE STUDY N/A 07/09/2017   Procedure: GIVENS CAPSULE STUDY;  Surgeon: Jonathon Bellows, MD;  Location: [facility] ENDOSCOPY;  Service: Gastroenterology;  Laterality: N/A;  . KNEE SURGERY     left  . ROTATOR CUFF REPAIR     right  . TONSILLECTOMY  1950    Family History  Problem Relation Age of Onset  . Breast cancer Mother   . Heart disease Mother   . Stroke Mother   . Cancer Mother   . COPD Mother   . Diabetes Mother   . Heart disease Father   . Diabetes Father   . Stroke Father   . Alcohol abuse Sister   . Drug abuse Sister   . Stroke Sister   . Cancer Sister   . Mental illness Sister   . Heart disease Brother   . Arthritis Brother   . Diabetes Brother   . Heart disease Maternal Grandfather   . Heart disease Paternal Grandfather        Social History:   reports that she has been smoking cigarettes.  She has a 50.00 pack-year smoking history. She has never used smokeless tobacco. She reports that she does not drink alcohol or use drugs.  Allergies:  Allergies  Allergen Reactions  . Mushroom Extract Complex Anaphylaxis    Made patient "deathly sick"  . Atenolol     "MD took me off of it bc it was doing something wrong" Made patient HYPOTENSIVE  . Ivp Dye [Iodinated Diagnostic Agents] Itching    Pt injected with IV contrast.  5 min after injection pt complained of itching behind ear and on her abd.  . Morphine And Related Other (See Comments)    "stops my breathing" NEAR-RESPIRATORY ARREST  . Percocet [Oxycodone-Acetaminophen] Nausea And Vomiting and Other (See Comments)    Stomach pains  . Percodan [Oxycodone-Aspirin] Nausea And Vomiting and Other (See Comments)    Stomach pains  . Verapamil Other (See Comments)    " MD told me this was screwing me up" MAKES PATIENT HYPOTENSIVE    Medications:                                                                                                                           No current facility-administered medications for this encounter.    Current Outpatient Medications  Medication Sig Dispense Refill  . albuterol (PROVENTIL HFA;VENTOLIN HFA) 108 (90 Base) MCG/ACT inhaler Inhale 2 puffs into the lungs every 6 (six) hours as needed for wheezing or shortness of breath. 3 Inhaler 1  . aspirin EC 81 MG tablet Take 1 tablet (81 mg total)  by mouth daily. 30 tablet 2  . atorvastatin (LIPITOR) 80 MG tablet Take 1 tablet (80 mg total) by mouth daily at 6 PM. 90 tablet 3  . diclofenac sodium (VOLTAREN) 1 % GEL Apply 2 g topically 4 (four) times daily. 1 Tube 1  . DULoxetine (CYMBALTA) 60 MG capsule Take 1 capsule (60 mg total) by mouth at bedtime. 30 capsule 11  . ferrous sulfate 325 (65 FE) MG EC tablet Take 1 tablet (325 mg total) by mouth 2 (two) times daily with a meal. 60 tablet 0  . fluticasone  (FLONASE) 50 MCG/ACT nasal spray Place 1 spray into both nostrils daily. 48 g 3  . furosemide (LASIX) 20 MG tablet Take 10 mg by mouth daily.  30 tablet 3  . gabapentin (NEURONTIN) 800 MG tablet Take 1 tablet (800 mg total) by mouth 2 (two) times daily. 180 tablet 1  . glipiZIDE-metformin (METAGLIP) 5-500 MG tablet Take 1 tablet by mouth 2 (two) times daily before a meal. 180 tablet 1  . levETIRAcetam (KEPPRA) 500 MG tablet Take 1 tablet (500 mg total) by mouth 2 (two) times daily. 60 tablet 0  . levothyroxine (SYNTHROID, LEVOTHROID) 175 MCG tablet Take 1 tablet (175 mcg total) by mouth daily before breakfast. (Patient taking differently: Take 112 mcg by mouth daily before breakfast. ) 90 tablet 3  . nitroGLYCERIN (NITROSTAT) 0.4 MG SL tablet Place 1 tablet (0.4 mg total) under the tongue every 5 (five) minutes as needed for chest pain. 30 tablet 0  . omeprazole (PRILOSEC) 20 MG capsule Take 20 mg by mouth 2 (two) times daily. Total 40 mg twice daily    . potassium chloride (MICRO-K) 10 MEQ CR capsule Take 1 capsule (10 mEq total) by mouth daily. (Patient taking differently: Take 10 mEq by mouth 2 (two) times daily. ) 90 capsule 3  . sucralfate (CARAFATE) 1 g tablet TAKE (1) TABLET BY MOUTH FOUR TIMES A DAY 360 tablet 1  . umeclidinium bromide (INCRUSE ELLIPTA) 62.5 MCG/INH AEPB Inhale 1 puff into the lungs daily. 1 each 11  . vitamin B-12 (CYANOCOBALAMIN) 1000 MCG tablet Take 1 tablet by mouth daily.  1     ROS:                                                                                                                                       History obtained from the patient  General ROS: negative for - chills, fatigue, fever, night sweats, weight gain or weight loss Psychological ROS: negative for - , hallucinations, memory difficulties, mood swings or  Ophthalmic ROS: negative for - blurry vision, double vision, eye pain or loss of vision ENT ROS: negative for - epistaxis, nasal discharge,  oral lesions, sore throat, tinnitus or vertigo Respiratory ROS: negative for - cough,  shortness of breath or wheezing Cardiovascular ROS: negative for - chest pain, dyspnea on exertion,  Gastrointestinal ROS:  negative for - abdominal pain, diarrhea,  nausea/vomiting or stool incontinence Genito-Urinary ROS: negative for - dysuria, hematuria, incontinence or urinary frequency/urgency Musculoskeletal ROS: Positive for-muscular weakness Neurological ROS: as noted in HPI   General Examination:                                                                                                      Weight 83.3 kg (183 lb 10.3 oz).  HEENT-  Normocephalic, no lesions, without obvious abnormality.  Normal external eye and conjunctiva.   Extremities- Warm, dry and intact Musculoskeletal-no joint tenderness, deformity or swelling Skin-warm and dry, no hyperpigmentation, vitiligo, or suspicious lesions  Neurological Examination Mental Status: Alert, oriented, thought content appropriate.  Speech fluent without evidence of aphasia.  Able to follow 3 step commands without difficulty. Cranial Nerves: WE:XHBZJI fields grossly normal,  III,IV, VI: ptosis not present, extra-ocular motions intact bilaterally, pupils equal, round, reactive to light and accommodation V,VII: smile shows left facial droop, decreased sensation in the left VIII: hearing normal bilaterally IX,X: uvula rises symmetrically XI: bilateral shoulder shrug XII: midline tongue extension Motor: Right : Upper extremity   5/5    Left:     Upper extremity   5/5-with drift  Lower extremity   5/5     Lower extremity   4/5 Tone and bulk:normal tone throughout; no atrophy noted Sensory: Pinprick and light decreased on left arm and leg but no neglect Deep Tendon Reflexes: 2+ and symmetric throughout Plantars: Bilaterally Cerebellar: normal finger-to-nose,and normal heel-to-shin test Gait: Not tested    On repeat exam: NIH 3  Lab  Results: Basic Metabolic Panel: Recent Labs  Lab 07/30/17 1013  NA 141  K 2.7*  CL 104  GLUCOSE 199*  BUN 4*  CREATININE 0.50    CBC: Recent Labs  Lab 07/30/17 1007 07/30/17 1013  WBC 6.7  --   NEUTROABS PENDING  --   HGB 9.7* 10.2*  HCT 34.4* 30.0*  MCV 82.7  --   PLT 294  --    Imaging: Ct Head Code Stroke Wo Contrast  Result Date: 07/30/2017 CLINICAL DATA:  Code stroke. 72 year old female with left facial droop and loss of extremity sensation. Prior left MCA/PCA watershed infarct. EXAM: CT HEAD WITHOUT CONTRAST TECHNIQUE: Contiguous axial images were obtained from the base of the skull through the vertex without intravenous contrast. COMPARISON:  Brain MRI and head CT 07/06/2017 and earlier. FINDINGS: Brain: Chronic posterior left hemisphere encephalomalacia is stable. Small chronic cerebellar infarcts redemonstrated and stable. Patchy additional bilateral cerebral white matter hypodensity with involvement of the anterior deep white matter capsules appears stable. No midline shift, ventriculomegaly, mass effect, evidence of mass lesion, intracranial hemorrhage or evidence of cortically based acute infarction. Vascular: Calcified atherosclerosis at the skull base. No suspicious intracranial vascular hyperdensity. Skull: Stable, negative. Sinuses/Orbits: Visualized paranasal sinuses and mastoids are stable and well pneumatized. Other: No acute orbit or scalp soft tissue findings. ASPECTS [facility] Stroke Program Early CT Score) - Ganglionic level infarction (caudate, lentiform nuclei, internal capsule, insula, M1-M3 cortex): 7 - Supraganglionic infarction (M4-M6 cortex): 3 Total  score (0-10 with 10 being normal): 10 IMPRESSION: 1. No acute intracranial abnormality. Chronic ischemic disease appears stable by CT since 07/06/2017. 2. ASPECTS is 10. 3. These results were communicated to Dr. Rory Percy at 10:31 amon 7/18/2019by text page via the [facility] messaging system. Electronically Signed   By: Genevie Ann M.D.   On: 07/30/2017 10:31    Assessment and plan discussed with with attending physician and they are in agreement.    Etta Quill PA-C [facility] [phone]  07/30/2017, 10:26 AM   Attending Neurohospitalist Addendum Patient seen and examined with APP/Resident. Agree with the history and physical as documented above.    Assessment: 72 y.o. female past medical history of hypertension, diabetes, hyperlipidemia, MI, atrial fibrillation not on anticoagulation because of GI bleeding, right thalamic stroke in March 2019, recent evaluation in June 2019 for left-sided weakness, a right thalamic stroke with left-sided weakness with no residual weakness currently that happened in March 2019, presents to the emergency room for acute onset of left facial droop and left-sided weakness. Her NIH stroke scale was 3-4-left-sided exam with some weakness  but also limited by pain. She has mild deficits, no cortical signs.  Not a candidate for endovascular thymectomy.  Next line she was in the window for IV TPA but given the mild nature of symptoms and recent history of GI bleeds severe enough to require transfusions precluded her from use of IV TPA. Allergic to IV contrast for CT.  IMPRESSION -New stroke v recrudescence of old stroke symptoms (repeat stroke work up as below)  Recommendations: -Admit to hospitalist or observation -Telemetry monitoring -Allow for permissive hypertension for the first 24-48h - only treat PRN if SBP >220 mmHg. Blood pressures can be gradually normalized to SBP<140 upon discharge. -MRI brain without contrast -MRA head without contrast, MRA neck with contrast -Echocardiogram -HgbA1c, fasting lipid panel -Frequent neuro checks -Prophylactic therapy-Antiplatelet med: Aspirin 325. Can consider DAPT but GI bleed precludes that. Will defer to stroke team for final decision at discharge. -Atorvastatin 80 mg PO daily -Risk factor modification -I discussed  the importance of exercise as well as smoking cessation. -PT consult, OT consult, Speech consult Long term she would benefit from anticoagulation for stroke prevention, but last stroke and current stroke likely small vessel etiology and requires lifestyle modification and aggressive medical management. Please page stroke NP/PA/MD (listed on AMION)  from 8am-4 pm as this patient will be followed by the stroke team at this point.  I have independently reviewed the chart, obtained history, review of systems and examined the patient.I have personally reviewed pertinent head/neck/spine imaging (CT/MRI). No evidence of bleed, large ischemic stroke. Small vessel disease and old lacunes seen. Please feel free to call with any questions. --- Amie Portland, MD [facility] Pager: [phone]  If 7pm to 7am, please call on call as listed on AMION.

## 2017-07-31 ENCOUNTER — Telehealth: Payer: Self-pay

## 2017-07-31 DIAGNOSIS — I480 Paroxysmal atrial fibrillation: Secondary | ICD-10-CM

## 2017-07-31 DIAGNOSIS — I63412 Cerebral infarction due to embolism of left middle cerebral artery: Secondary | ICD-10-CM

## 2017-07-31 LAB — CBC
HCT: 35.6 % — ABNORMAL LOW (ref 36.0–46.0)
HEMOGLOBIN: 10.3 g/dL — AB (ref 12.0–15.0)
MCH: 23.5 pg — ABNORMAL LOW (ref 26.0–34.0)
MCHC: 28.9 g/dL — AB (ref 30.0–36.0)
MCV: 81.3 fL (ref 78.0–100.0)
PLATELETS: 271 10*3/uL (ref 150–400)
RBC: 4.38 MIL/uL (ref 3.87–5.11)
RDW: 23.4 % — ABNORMAL HIGH (ref 11.5–15.5)
WBC: 6.8 10*3/uL (ref 4.0–10.5)

## 2017-07-31 LAB — BASIC METABOLIC PANEL
ANION GAP: 11 (ref 5–15)
BUN: 10 mg/dL (ref 8–23)
CALCIUM: 8.7 mg/dL — AB (ref 8.9–10.3)
CO2: 23 mmol/L (ref 22–32)
Chloride: 109 mmol/L (ref 98–111)
Creatinine, Ser: 0.52 mg/dL (ref 0.44–1.00)
GFR calc non Af Amer: >60 mL/min (ref >60)
GLUCOSE: 133 mg/dL — AB (ref 70–99)
Potassium: 3.9 mmol/L (ref 3.5–5.1)
Sodium: 143 mmol/L (ref 135–145)

## 2017-07-31 LAB — GLUCOSE, CAPILLARY
GLUCOSE-CAPILLARY: 141 mg/dL — AB (ref 70–99)
Glucose-Capillary: 136 mg/dL — ABNORMAL HIGH (ref 70–99)
Glucose-Capillary: 137 mg/dL — ABNORMAL HIGH (ref 70–99)
Glucose-Capillary: 146 mg/dL — ABNORMAL HIGH (ref 70–99)

## 2017-07-31 LAB — LIPID PANEL
Cholesterol: 113 mg/dL (ref 0–200)
HDL: 33 mg/dL — AB (ref >40)
LDL Cholesterol: 44 mg/dL (ref 0–99)
TRIGLYCERIDES: 179 mg/dL — AB (ref <150)
Total CHOL/HDL Ratio: 3.4 RATIO
VLDL: 36 mg/dL (ref 0–40)

## 2017-07-31 LAB — HEMOGLOBIN A1C
Hgb A1c MFr Bld: 5.6 % (ref 4.8–5.6)
Mean Plasma Glucose: 114.02 mg/dL

## 2017-07-31 MED ORDER — GLUCERNA SHAKE PO LIQD
237.0000 mL | Freq: Two times a day (BID) | ORAL | Status: DC
  Administered 2017-07-31 – 2017-08-03 (×4): 237 mL via ORAL

## 2017-07-31 MED ORDER — ASPIRIN 325 MG PO TABS
325.0000 mg | ORAL_TABLET | Freq: Every day | ORAL | Status: DC
  Administered 2017-08-01 – 2017-08-03 (×3): 325 mg via ORAL
  Filled 2017-07-31 (×3): qty 1

## 2017-07-31 MED ORDER — ACETAMINOPHEN 325 MG PO TABS
650.0000 mg | ORAL_TABLET | Freq: Once | ORAL | Status: AC
  Administered 2017-07-31: 650 mg via ORAL
  Filled 2017-07-31: qty 2

## 2017-07-31 NOTE — Progress Notes (Signed)
STROKE TEAM PROGRESS NOTE   SUBJECTIVE (INTERVAL HISTORY) No family is at the bedside.  Patient seems as her baseline, sitting in bed, no distress.  She was on Eliquis for A. fib and stroke, however stopped in 06/2017 due to GI bleeding.  Currently on aspirin.  Discussed with her GI doctor, noted that patient is high risk GI bleeding patient with any antiplatelet or anticoagulation.   OBJECTIVE Temp:  [97.6 F (36.4 C)-98.7 F (37.1 C)] 98.3 F (36.8 C) (07/19 0421) Pulse Rate:  [53-71] 54 (07/19 0421) Cardiac Rhythm: Normal sinus rhythm (07/18 1901) Resp:  [14-24] 16 (07/19 0421) BP: (101-140)/(50-107) 117/61 (07/19 0421) SpO2:  [94 %-100 %] 97 % (07/19 0421) Weight:  [183 lb 10.3 oz (83.3 kg)] 183 lb 10.3 oz (83.3 kg) (07/18 1048)  CBC:  Recent Labs  Lab 07/30/17 1007 07/30/17 1013  WBC 6.7  --   NEUTROABS 4.1  --   HGB 9.7* 10.2*  HCT 34.4* 30.0*  MCV 82.7  --   PLT 294  --     Basic Metabolic Panel:  Recent Labs  Lab 07/30/17 1007 07/30/17 1013  NA 141 141  K 2.9* 2.7*  CL 107 104  CO2 25  --   GLUCOSE 205* 199*  BUN 6* 4*  CREATININE 0.66 0.50  CALCIUM 8.4*  --     Lipid Panel:     Component Value Date/Time   CHOL 113 07/31/2017 0342   TRIG 179 (H) 07/31/2017 0342   HDL 33 (L) 07/31/2017 0342   CHOLHDL 3.4 07/31/2017 0342   VLDL 36 07/31/2017 0342   LDLCALC 44 07/31/2017 0342   HgbA1c:  Lab Results  Component Value Date   HGBA1C 5.6 07/31/2017   Urine Drug Screen:     Component Value Date/Time   LABOPIA NONE DETECTED 07/30/2017 1608   COCAINSCRNUR NONE DETECTED 07/30/2017 1608   COCAINSCRNUR NONE DETECTED 07/06/2017 1114   LABBENZ NONE DETECTED 07/30/2017 1608   AMPHETMU NONE DETECTED 07/30/2017 1608   THCU NONE DETECTED 07/30/2017 1608   LABBARB (A) 07/30/2017 1608    Result not available. Reagent lot number recalled by manufacturer.    Alcohol Level     Component Value Date/Time   ETH <10 07/30/2017 1007    IMAGING I have  personally reviewed the radiological images below and agree with the radiology interpretations.  Mr Samantha Aguirre Head Wo Contrast 07/30/2017 IMPRESSION:   MRI HEAD:  1. Acute 5 mm LEFT internal capsule infarct.  2. Old LEFT MCA territory infarct. Old RIGHT thalamus lacunar infarct. Multiple old small cerebellar infarcts.  3. Stable moderate parenchymal brain volume loss and moderate chronic small vessel ischemic changes.   MRA HEAD:  1. No emergent large vessel occlusion or flow-limiting stenosis.  2. Previously reported stenosis not apparent today; prior findings attributable to motion artifact.  3. 2 mm LEFT cavernous aneurysm versus infundibulum.   Ct Head Code Stroke Wo Contrast 07/30/2017 IMPRESSION:  1. No acute intracranial abnormality. Chronic ischemic disease appears stable by CT since 07/06/2017.  2. ASPECTS is 10.   Transthoracic Echocardiogram  04/06/2017 Study Conclusions - Left ventricle: The cavity size was normal. Systolic function was   normal. The estimated ejection fraction was in the range of 55% to 60%. - Aortic valve: Valve area (Vmax): 2.1 cm^2.  Bilateral Carotid Dopplers  04/06/2017 IMPRESSION: Less than 50% stenosis in the right and left internal carotid rteries.   PHYSICAL EXAM  Temp:  [97.6 F (36.4 C)-98.4 F (36.9 C)] 98  F (36.7 C) (07/19 1211) Pulse Rate:  [53-84] (P) 66 (07/19 1532) Resp:  [14-23] 16 (07/19 0918) BP: (104-121)/(57-88) 121/75 (07/19 0756) SpO2:  [94 %-97 %] (P) 97 % (07/19 1532)  General - Well nourished, well developed, in no apparent distress.  Ophthalmologic - fundi not visualized due to noncooperation.  Cardiovascular - Regular rate and rhythm, not in A. fib.  Mental Status -  Level of arousal and orientation to time, place, and person were intact. Language including expression, naming, repetition, comprehension was assessed and found intact.  Cranial Nerves II - XII - II - Visual field intact OU. III, IV, VI -  Extraocular movements intact. V - Facial sensation decreased on the left, 50% of the right. VII - chronic left nasolabial fold flattening. VIII - Hearing & vestibular intact bilaterally. X - Palate elevates symmetrically. XI - Chin turning & shoulder shrug intact bilaterally. XII - Tongue protrusion intact.  Motor Strength - The patient's strength was normal in RUE and RLE, left upper extremity pronator drift, left lower extremity distal 4/5 but with some giveaway weakness.  Bulk was normal and fasciculations were absent.   Motor Tone - Muscle tone was assessed at the neck and appendages and was normal.  Reflexes - The patient's reflexes were symmetrical in all extremities and she had no pathological reflexes.  Sensory - Light touch, temperature/pinprick were assessed and were decreased on the left, 80% of the right.    Coordination - The patient had mild dysmetria on the left hand.  Tremor was absent.  Gait and Station - deferred.   ASSESSMENT/PLAN [name] is a 72 y.o. female with history of previous strokes, atrial fibrillation (anticoagulation stopped secondary to GI bleeding), congestive heart failure, COPD, coronary artery disease, diabetes mellitus, frequent headaches, hypertension, seizure disorder, and arthritis presenting with left facial droop, left-sided weakness and numbness. She did not receive IV t-PA due to history of GI bleeding and minimal deficits.  Stroke:  Acute 5 mm LEFT internal capsule infarct - embolic - secondary to atrial fibrillation not on AC.  Resultant chronic left-sided weakness  CT head - No acute intracranial abnormality.  MRI head -  Acute 5 mm LEFT internal capsule infarct. Multiple old infarcts.  MRA head - 2 mm LEFT cavernous aneurysm versus infundibulum.   Carotid Doppler unremarkable 03/2017  2D Echo EF 55 to 60% 03/2017  LDL 44  HgbA1c - 5.6  UDS negative  VTE prophylaxis - Lovenox  aspirin 81 mg daily prior to admission,  now on aspirin 81 mg daily.  Had discussion with her GI physician, patient appears to have high risk of GI bleeding on any antiplatelet or anticoagulation.  Further long discussion with pt, she would like to proceed with Watchman device to be done in [place] or [place].  Will increase aspirin to 325.  Discussed with Dr. Tyrell Antonio, called her PCP office and informed them about urgency for the [facility] or [facility] cardiology referral.   Ongoing aggressive stroke risk factor management  Therapy recommendations:  CIR  Disposition:  Pending  GIB   07/11/17 admisstion to [facility] "GIB/chronic blood loss anemia:History of recent anemia requiring transfusions during 2 different hospitalizations. EGD/colonoscopy initially notable earlier in 2019 for colonic polyps which were removed. EGD this week notable for AVMs which were treated with local therapy. Follow-up capsule endoscopy notable for active small bowel bleeding and nonbleeding AVMs. Patient was transferred to [facility] for evaluation for possible balloon enteroscopy. GI did review disc with capsule enteroscopy images.  Noted proximal small bowel lesions that appeared to be ulcers with oozing per GI. They feel these lesions could be amenable to treatment with push enteroscopy. The polyps noted on capsule appeared to be submucosal lesions possibly lipomas, that are likely not a bleeding source. Noted more distal AVMs that are nonbleeding. Hgb stable ~ 8.5. GI was consulted; s/p enteroscopy today; noted one non-bleeding jejunal ulcer with no stigmata of bleeding.A single non-bleeding angiodysplastic lesion in the jejunum. Treated with argon plasma coagulation (APC).An area in the proximal jejunum was tattooed. She will continue PPI and sucralfate per outside GI recommendations. Ok to resume ASA in 3 days (continue to hold apixaban) if no further bleeding. We were able to advance her diet and she tolerated a regular diet prior to DC."  Discussion with her GI physician, apparently  very high risk for GI bleeding on any antiplatelet or anticoagulation  As long discussion with patient, given her several choices for antiplatelet or anticoagulation, she would like to be referred to [facility] or [place] cardiology for watchman device  History of stroke  With residual right-sided weakness  08/2015, worsening right-sided weakness, MRI negative, consider recrudescence  02/2016, left facial droop and left-sided weakness, MRI/MRA negative, found to have A. fib, put on Eliquis  04/2016, headache, imbalance, right eye pain, facial droop, MRI negative  03/2017, left facial droop and left-sided weakness, MRI showed right thalamic and left temporal infarcts.  MRI showed bilateral M2/M3, right PCA atherosclerosis.  06/2017, left-sided weakness, MRI negative for acute stroke  History of seizure  On home Keppra  PAF off Eliquis  On aspirin PTA due to GI bleeding  Rate controlled  Not on aspirin 325  Refer to [facility] or [facility] cardiology for watchman device  High risk GI bleeding  Hypertension  Stable . Permissive hypertension (OK if < 220/120) but gradually normalize in 5-7 days . Long-term BP goal normotensive  Hyperlipidemia  Lipid lowering medication PTA:  Lipitor 80 mg daily  LDL 44, goal < 70  Current lipid lowering medication:  Lipitor 80 mg daily  Continue statin at discharge  Diabetes  HgbA1c 5.6, goal < 7.0  Controlled  SSI  CBG monitoring  Tobacco abuse  Current smoker  Smoking cessation counseling provided  Pt is willing to quit  Other Stroke Risk Factors  Advanced age  Obesity, Body mass index is 34.7 kg/m., recommend weight loss, diet and exercise as appropriate   Family hx stroke (mother father and sister)  Coronary artery disease  Other Active Problems  Hypokalemia  Anemia - iron deficiency  Hospital day # 1  Neurology will sign off. Please call with questions. Pt will follow up with stroke clinic NP at [facility] in about 4 weeks.  Thanks for the consult.  Rosalin Hawking, MD PhD Stroke Neurology 07/31/2017 6:49 PM  I spent  35 minutes in total face-to-face time with the patient, more than 50% of which was spent in counseling and coordination of care, reviewing test results, images and medication, and discussing the diagnosis of stroke, history of multiple strokes, A. fib not on anticoagulation, seizure, GI bleeding, treatment plan and potential prognosis. This patient's care requiresreview of multiple databases, neurological assessment, discussion with family, other specialists and medical decision making of high complexity.   To contact Stroke Continuity provider, please refer to [email or web address] After hours, contact General Neurology

## 2017-07-31 NOTE — NC FL2 (Signed)
[place] LEVEL OF CARE SCREENING TOOL     IDENTIFICATION  Patient Name: Samantha Aguirre Birthdate: December 15, 1945 Sex: female Admission Date (Current Location): 07/30/2017  [facility] and [place] Number:  Engineering geologist and Address:  [facility] [place] [place] [address] [place], [place] [address]      Provider Number: [id number]  Attending Physician Name and Address:  Elmarie Shiley, MD  Relative Name and Phone Number:  Sanai Frick; daughter [phone]    Current Level of Care: Hospital Recommended Level of Care: [place] Prior Approval Number:    Date Approved/Denied:   PASRR Number: [id number]  Discharge Plan: SNF    Current Diagnoses: Patient Active Problem List   Diagnosis Date Noted  . Acute CVA (cerebrovascular accident) ([place]) 07/30/2017  . GI bleed 07/08/2017  . Anemia 07/08/2017  . Angiodysplasia of stomach and duodenum with hemorrhage   . CVA (cerebral vascular accident) ([place]) 07/06/2017  . Stroke (cerebrum) ([place])   . Goals of care, counseling/discussion   . Palliative care encounter   . COPD exacerbation ([place]) 04/03/2017  . Flu 03/27/2017  . Iron deficiency anemia due to chronic blood loss   . Migraine 02/13/2017  . Cataracts, bilateral 01/15/2017  . Chronic venous insufficiency 10/06/2016  . Recurrent major depressive disorder, in partial remission ([place]) 10/06/2016  . Left-sided weakness 10/03/2016  . Chest pain 02/27/2016  . History of cerebrovascular accident (CVA) with residual deficit 02/19/2016  . Thumb pain, left 01/22/2016  . Traumatic closed nondisplaced fracture of base of metacarpal bone of left thumb 01/22/2016  . Colon cancer screening 11/27/2015  . Depression 11/14/2015  . Coronary artery disease 11/13/2015  . Hypertension 11/13/2015  . History of skin cancer 11/13/2015  . Osteoporosis 11/13/2015  . Chronic low back pain 11/13/2015  . Left medial knee pain 11/13/2015  .  Osteoarthritis of multiple joints 11/13/2015  . Other specified hypothyroidism 08/16/2015  . Controlled type 2 diabetes mellitus with diabetic neuropathy ([place]) 08/16/2015  . COPD (chronic obstructive pulmonary disease) ([place]) 08/16/2015  . Tobacco abuse 08/16/2015  . HLD (hyperlipidemia) 08/16/2015  . History of CHF (congestive heart failure) 08/16/2015  . Seizures (HCC)     Orientation RESPIRATION BLADDER Height & Weight     Self, Time, Situation, Place  Normal Continent Weight: 183 lb 10.3 oz (83.3 kg) Height:  5\' 1"  (154.9 cm)  BEHAVIORAL SYMPTOMS/MOOD NEUROLOGICAL BOWEL NUTRITION STATUS    Convulsions/Seizures(hx of seizures) Continent Diet(see discharge summary)  AMBULATORY STATUS COMMUNICATION OF NEEDS Skin   Limited Assist Verbally Skin abrasions(abrasions on arms)                       Personal Care Assistance Level of Assistance  Bathing, Feeding, Dressing Bathing Assistance: Limited assistance Feeding assistance: Limited assistance Dressing Assistance: Limited assistance     Functional Limitations Info  Sight, Hearing, Speech Sight Info: Adequate Hearing Info: Adequate Speech Info: Adequate    SPECIAL CARE FACTORS FREQUENCY  PT (By licensed PT), OT (By licensed OT)     PT Frequency: 5x week OT Frequency: 5x week            Contractures Contractures Info: Not present    Additional Factors Info  Code Status, Allergies, Psychotropic Code Status Info: DNR Allergies Info: METHOTREXATE, MUSHROOM EXTRACT COMPLEX, ATENOLOL, IVP DYE IODINATED DIAGNOSTIC AGENTS, MORPHINE AND RELATED, OXYCODONE HCL, PERCOCET OXYCODONE-ACETAMINOPHEN, PERCODAN OXYCODONE-ASPIRIN, VERAPAMIL  Psychotropic Info: DULoxetine (CYMBALTA) DR capsule 60 mg daily at bedtime  PO; levETIRAcetam (KEPPRA) tablet 500 mg 2x daily PO         Current Medications (07/31/2017):  This is the current hospital active medication list Current Facility-Administered Medications  Medication Dose Route  Frequency Provider Last Rate Last Dose  . 0.9 %  sodium chloride infusion   Intravenous Continuous Karmen Bongo, MD 50 mL/hr at 07/31/17 1407    . acetaminophen (TYLENOL) tablet 650 mg  650 mg Oral Q4H PRN Karmen Bongo, MD   650 mg at 07/31/17 0451   Or  . acetaminophen (TYLENOL) solution 650 mg  650 mg Per Tube Q4H PRN Karmen Bongo, MD       Or  . acetaminophen (TYLENOL) suppository 650 mg  650 mg Rectal Q4H PRN Karmen Bongo, MD      . Derrill Memo ON 08/01/2017] aspirin tablet 325 mg  325 mg Oral Daily Regalado, Belkys A, MD      . atorvastatin (LIPITOR) tablet 80 mg  80 mg Oral q1800 Karmen Bongo, MD   80 mg at 07/31/17 1701  . DULoxetine (CYMBALTA) DR capsule 60 mg  60 mg Oral Ivery Quale, MD   60 mg at 07/31/17 0005  . feeding supplement (GLUCERNA SHAKE) (GLUCERNA SHAKE) liquid 237 mL  237 mL Oral BID BM Regalado, Belkys A, MD   237 mL at 07/31/17 1431  . ferrous sulfate tablet 325 mg  325 mg Oral BID WC Karmen Bongo, MD   325 mg at 07/31/17 1702  . fluticasone (FLONASE) 50 MCG/ACT nasal spray 1 spray  1 spray Each Nare Daily Karmen Bongo, MD   1 spray at 07/31/17 0931  . gabapentin (NEURONTIN) capsule 800 mg  800 mg Oral BID Karmen Bongo, MD   800 mg at 07/31/17 0931  . insulin aspart (novoLOG) injection 0-15 Units  0-15 Units Subcutaneous TID WC Karmen Bongo, MD   2 Units at 07/31/17 1702  . levETIRAcetam (KEPPRA) tablet 500 mg  500 mg Oral BID Karmen Bongo, MD   500 mg at 07/31/17 0931  . levothyroxine (SYNTHROID, LEVOTHROID) tablet 112 mcg  112 mcg Oral QAC breakfast Karmen Bongo, MD   112 mcg at 07/31/17 0849  . pantoprazole (PROTONIX) EC tablet 40 mg  40 mg Oral BID Karmen Bongo, MD   40 mg at 07/31/17 0931  . senna-docusate (Senokot-S) tablet 1 tablet  1 tablet Oral QHS PRN Karmen Bongo, MD      . sucralfate (CARAFATE) tablet 1 g  1 g Oral TID WC & HS Karmen Bongo, MD   1 g at 07/31/17 1702  . umeclidinium bromide (INCRUSE ELLIPTA) 62.5  MCG/INH 1 puff  1 puff Inhalation Daily Karmen Bongo, MD   1 puff at 07/31/17 0175     Discharge Medications: Please see discharge summary for a list of discharge medications.  Relevant Imaging Results:  Relevant Lab Results:   Additional Information SS# [place], [place]

## 2017-07-31 NOTE — Progress Notes (Signed)
Initial Nutrition Assessment  DOCUMENTATION CODES:   Obesity unspecified  INTERVENTION:    Glucerna Shake po BID, each supplement provides 220 kcal and 10 grams of protein  NUTRITION DIAGNOSIS:   Increased nutrient needs related to acute illness as evidenced by estimated needs  GOAL:   Patient will meet greater than or equal to 90% of their needs  MONITOR:   PO intake, Supplement acceptance, Weight trends, Labs, Skin, I & O's  REASON FOR ASSESSMENT:   Consult Other (Comment)(stroke)  ASSESSMENT:   72 y.o. Female with PMH significant of CVA; HTN; HLD; CAD; DM; COPD; diastolic CHF; and afib on Coumadin presenting with code stroke.   RD spoke with pt at bedside. She is eating lunch (fish, baked potato). Doesn't like the food here. States it's "terrible" and her appetite "could be better". Pt reports she typically consumes 1-2 meals per day at home.  She would like to try some nutrition supplements. "I've never tried them". Pt denies significant unintentional weight loss recently. Labs and medications reviewed. CBG's [phone].  NUTRITION - FOCUSED PHYSICAL EXAM:  Completed. No muscle or fat depleitons noticed.  Diet Order:   Diet Order           Diet heart healthy/carb modified Room service appropriate? Yes; Fluid consistency: Thin  Diet effective ____         EDUCATION NEEDS:   No education needs have been identified at this time  Skin:  Skin Assessment: Reviewed RN Assessment  Last BM:  7/17  Height:   Ht Readings from Last 1 Encounters:  07/30/17 5\' 1"  (1.549 m)   Weight:   Wt Readings from Last 1 Encounters:  07/30/17 183 lb 10.3 oz (83.3 kg)   BMI:  Body mass index is 34.7 kg/m.  Estimated Nutritional Needs:   Kcal:  1800-2000  Protein:  90-105 gm  Fluid:  1.8-2.0 L  Arthur Holms, RD, LDN Pager #: [phone] After-Hours Pager #: [phone]

## 2017-07-31 NOTE — Plan of Care (Signed)
I have reviewed case with Dr. Tyrell Antonio [facility]) and Dr. Erlinda Hong (Neurology).  Patient high risk for GI tract rebleeding on anticoagulation or antiplatelet therapy, given recent admission for GI bleeding (with multiple sources seen, but especially small bowel lesions out of reach of typical endoscopes).  I have advised Dr. Erlinda Hong to weigh significant risks of GI tract rebleeding if patient treated on anticoagulation versus risks of recurrent/progressive stroke off anticoagulation.  There is no role for repeat endoscopy at this time (as our scopes can't reach the bleeding sites seen on outside capsule endoscopy, as she just had another stroke (thus high sedation risk), unless she has destabilizing life-threatening GI bleeding).  There is no "easy" answer here and either approach is subject to potential adverse effects (either from GI rebleeding or escalation of stroke).  Aspirin monotherapy may have less proclivity to GI bleeding in comparison to apixaban or dual antiplatelet therapy, but not sure whether this is sufficient from neurologic standpoint to prevent recurrent stroke.  Prophylactic PPI advised, but is likely not going to decrease risk of bleeding from AVM lesions (especially those in small bowel).  If anticoagulation opted as management, and patient rebleeds, she would need transfer to tertiary center [facility] or [facility]) where there is availability of antegrade balloon enteroscopy (which we don't have at our facility).

## 2017-07-31 NOTE — Progress Notes (Signed)
Rehab Admissions Coordinator Note:  Patient was screened by Cleatrice Burke for appropriateness for an Inpatient Acute Rehab Consult per PT and SLP recommendations. At this time, we are recommending Inpatient Rehab consult if pt would like to be considered for admit. I will contact MD for order.  Cleatrice Burke 07/31/2017, 3:20 PM  I can be reached at [phone].

## 2017-07-31 NOTE — Evaluation (Signed)
Occupational Therapy Evaluation Patient Details Name: Samantha Aguirre MRN: [id number] DOB: 1945/07/19 Today's Date: 07/31/2017    History of Present Illness Patient is a 72 y/o female presenting with primary complaints of facial droop and aphasia. Of note, recent hospitalization (March 2019) at [facility] for CVA. Patient with a PMH significant for CVA; HTN; HLD; CAD; DM; COPD; diastolic CHF; and afib on Coumadin. MRI confirming acute stroke (Acute 5 mm LEFT internal capsule infarct) - admitted for continued neuro work-up.    Clinical Impression   Pt currently completes ADLs at a min guard to supervision level.  She was able to ambulate to the toilet and complete transfer on and off with min guard assist as well as standing at the sink for grooming tasks with close supervision.  She did exhibit some weakness in the LUE but was still able to utilize it for dressing at a diminished level without assist.  Feel she is getting close to her PFL before the stroke which was modified independent at home with use of the RW for support.  Recommend acute care OT to continue progression to modified independent level.      Follow Up Recommendations  Home health OT;Supervision - Intermittent    Equipment Recommendations  Tub/shower bench;3 in 1 bedside commode       Precautions / Restrictions Precautions Precautions: Fall Restrictions Weight Bearing Restrictions: No      Mobility Bed Mobility Overal bed mobility: Needs Assistance Bed Mobility: Supine to Sit     Supine to sit: Min assist;Min guard     General bed mobility comments: for line management and trunk control as patient reports dizziness upon initial sitting EOB  Transfers Overall transfer level: Needs assistance Equipment used: Rolling walker (2 wheeled) Transfers: Sit to/from Stand Sit to Stand: Min guard         General transfer comment: very light Min A to power up at bedside and from low toilet. Prefers a forward flexed  posture with transfers requiring verbal cueing for upright    Balance Overall balance assessment: Needs assistance;History of Falls Sitting-balance support: No upper extremity supported;Feet supported Sitting balance-Leahy Scale: Good     Standing balance support: Bilateral upper extremity supported;During functional activity Standing balance-Leahy Scale: Fair Standing balance comment: Pt able to stand at the sink to wash her hands with supervision without UE support but needed use of walker with functional mobility.                             ADL either performed or assessed with clinical judgement   ADL Overall ADL's : Needs assistance/impaired     Grooming: Standing;Wash/dry hands;Min guard   Upper Body Bathing: Supervision/ safety;Sitting   Lower Body Bathing: Min guard;Sit to/from stand   Upper Body Dressing : Supervision/safety;Sitting   Lower Body Dressing: Min guard;Sit to/from stand   Toilet Transfer: Min guard;Ambulation;RW   Toileting- Water quality scientist and Hygiene: Min guard;Sit to/from stand         General ADL Comments: Pt reports receiving HHOT and PT prior to admission.  Currently able to complete simulated selfcare tasks at a min guard to min assist level with use of the RW for safety.  Has shower seat at home as well as vanity next to the toilet she would use for sit to stand.  She reports feeling close to her functional level prior to this CVA.     Vision Baseline Vision/History: Wears glasses Wears Glasses:  Reading only Patient Visual Report: No change from baseline Vision Assessment?: Yes Eye Alignment: Within Functional Limits Ocular Range of Motion: Within Functional Limits Alignment/Gaze Preference: Within Defined Limits Tracking/Visual Pursuits: Able to track stimulus in all quads without difficulty Convergence: Within functional limits Visual Fields: No apparent deficits(decreased bilaterally secondary to skin folds of eyelids)             Pertinent Vitals/Pain Pain Assessment: 0-10 Pain Score: 2  Pain Location: headache Pain Descriptors / Indicators: Aching Pain Intervention(s): Repositioned     Hand Dominance Right   Extremity/Trunk Assessment Upper Extremity Assessment Upper Extremity Assessment: LUE deficits/detail RUE Deficits / Details: 4/5 throughout LUE Deficits / Details: AROM shoulder flexion 0-120 degrees with AAROM 0-150 degrees and end range shoulder pain.  Elbow flexion 3+/5 and grip 3+/5 LUE Sensation: decreased light touch(decreased light touch throughout the UE.  ) LUE Coordination: decreased gross motor(dysmetria noted with finger to nose testing)   Lower Extremity Assessment Lower Extremity Assessment: Generalized weakness   Cervical / Trunk Assessment Cervical / Trunk Assessment: Normal   Communication Communication Communication: No difficulties   Cognition Arousal/Alertness: Awake/alert Behavior During Therapy: WFL for tasks assessed/performed Overall Cognitive Status: Within Functional Limits for tasks assessed                                                Home Living Family/patient expects to be discharged to:: Private residence Living Arrangements: Alone Available Help at Discharge: Friend(s);Available PRN/intermittently Type of Home: Apartment Home Access: Level entry     Home Layout: One level     Bathroom Shower/Tub: Teacher, early years/pre: Standard Bathroom Accessibility: Yes   Home Equipment: Walker - 2 wheels;Cane - quad;Shower seat;Adaptive equipment Adaptive Equipment: Reacher Additional Comments: No using power chair due to 2" step at egress of new apartment       Prior Functioning/Environment Level of Independence: Independent with assistive device(s);Needs assistance  Gait / Transfers Assistance Needed: Pt able to do in home and limited community ambulation (cane in home, walker out of home, does use scooter while  shopping).     Comments: patient reporting she has been wokring with HH therapies for some time        OT Problem List: Decreased strength;Decreased range of motion;Impaired balance (sitting and/or standing);Decreased knowledge of use of DME or AE;Impaired UE functional use;Impaired sensation      OT Treatment/Interventions: Self-care/ADL training;DME and/or AE instruction;Balance training;Patient/family education;Neuromuscular education;Therapeutic exercise    OT Goals(Current goals can be found in the care plan section) Acute Rehab OT Goals Patient Stated Goal: Get back home and keep working on her therapy. OT Goal Formulation: With patient Time For Goal Achievement: 08/14/17 Potential to Achieve Goals: Good  OT Frequency: Min 2X/week   Barriers to D/C: Decreased caregiver support             AM-PAC PT "6 Clicks" Daily Activity     Outcome Measure Help from another person eating meals?: None Help from another person taking care of personal grooming?: A Little Help from another person toileting, which includes using toliet, bedpan, or urinal?: A Little Help from another person bathing (including washing, rinsing, drying)?: A Little Help from another person to put on and taking off regular upper body clothing?: A Little Help from another person to put on and taking off regular lower body  clothing?: A Little 6 Click Score: 19   End of Session Equipment Utilized During Treatment: Surveyor, mining Communication: Mobility status  Activity Tolerance: Patient tolerated treatment well Patient left: in chair;with chair alarm set  OT Visit Diagnosis: Unsteadiness on feet (R26.81);Hemiplegia and hemiparesis Hemiplegia - Right/Left: Left Hemiplegia - dominant/non-dominant: Non-Dominant Hemiplegia - caused by: Cerebral infarction                Time: 1525-1600 OT Time Calculation (min): 35 min Charges:  OT General Charges $OT Visit: 1 Visit OT Evaluation $OT Eval Moderate  Complexity: 1 Mod OT Treatments $Self Care/Home Management : 23-37 mins    Jericha Bryden OTR/L 07/31/2017, 4:50 PM

## 2017-07-31 NOTE — Social Work (Signed)
CSW acknowledging consult for placement, CSW will follow for therapy recommendations, pt will need 3 night medically qualifying stay for SNF placement.   Alexander [place], [place] Work [phone]

## 2017-07-31 NOTE — Social Work (Signed)
CSW following, aware PT recommending CIR and OT recommending [place]. If pt requires SNF placement please reach out to social worker on call this weekend- if pt medically neccesary for 3 night inpatient stay will meet qualifying stay on Sunday 7/21.   Alexander Mt, [place] Work [phone]

## 2017-07-31 NOTE — Evaluation (Signed)
Physical Therapy Evaluation Patient Details Name: Samantha Aguirre MRN: [id number] DOB: 1945/11/29 Today's Date: 07/31/2017   History of Present Illness  Patient is a 72 y/o female presenting with primary complaints of facial droop and aphasia. Of note, recent hospitalization (March 2019) at [facility] for CVA. Patient with a PMH significant for CVA; HTN; HLD; CAD; DM; COPD; diastolic CHF; and afib on Coumadin. MRI confirming acute stroke (Acute 5 mm LEFT internal capsule infarct) - admitted for continued neuro work-up.     Clinical Impression  Samantha Aguirre is a pleasant 72 y/o female admitted with the above listed diagnosis. Patient reporting that prior to admission she lived alone and was mod I with mobility with RW vs SPC. Patient today requiring Min A to Min guard for mobility for general safety and stability. Patient presenting with reduced strength, balance, safety, and functional mobility with patient to benefit from skilled services to progress the above listed deficits. PT currently recommending follow-up therapy follow discharge to progress safe functional mobility as able. PT to continue to follow acutely.    Follow Up Recommendations CIR    Equipment Recommendations  None recommended by PT    Recommendations for Other Services OT consult;Rehab consult     Precautions / Restrictions Precautions Precautions: Fall Restrictions Weight Bearing Restrictions: No      Mobility  Bed Mobility Overal bed mobility: Needs Assistance Bed Mobility: Supine to Sit     Supine to sit: Min assist;Min guard     General bed mobility comments: for line management and trunk control as patient reports dizziness upon initial sitting EOB  Transfers Overall transfer level: Needs assistance Equipment used: Rolling walker (2 wheeled) Transfers: Sit to/from Stand Sit to Stand: Min assist;Min guard         General transfer comment: very light Min A to power up at bedside and from low  toilet. Prefers a forward flexed posture with transfers requiring verbal cueing for upright  Ambulation/Gait Ambulation/Gait assistance: Min guard Gait Distance (Feet): 30 Feet Assistive device: Rolling walker (2 wheeled) Gait Pattern/deviations: Step-to pattern;Step-through pattern;Decreased stride length;Trunk flexed Gait velocity: decreased   General Gait Details: Min guard for short ambulation distance in room; verbal cueing for safety and sequencing; taking 1 standing rest break due to feeling light headed with VSS throughout  Stairs            Wheelchair Mobility    Modified Rankin (Stroke Patients Only)       Balance Overall balance assessment: Needs assistance;History of Falls Sitting-balance support: No upper extremity supported;Feet supported Sitting balance-Leahy Scale: Fair     Standing balance support: Bilateral upper extremity supported;During functional activity Standing balance-Leahy Scale: Fair                               Pertinent Vitals/Pain Pain Assessment: No/denies pain Pain Score: 2  Pain Location: head and knee Pain Descriptors / Indicators: Aching Pain Intervention(s): Limited activity within patient's tolerance;Monitored during session;Other (comment)(rn gave medicine during session)    Home Living Family/patient expects to be discharged to:: Private residence Living Arrangements: Alone Available Help at Discharge: Friend(s);Available PRN/intermittently Type of Home: Apartment Home Access: Level entry     Home Layout: One level Home Equipment: Walker - 2 wheels;Cane - quad;Shower seat;Adaptive equipment Additional Comments: No using power chair due to 2" step at egress of new apartment     Prior Function Level of Independence: Independent with assistive device(s);Needs assistance  Gait / Transfers Assistance Needed: Pt able to do in home and limited community ambulation (cane in home, walker out of home, does use  scooter while shopping).     Comments: patient reporting she has been wokring with HH therapies for some time     Hand Dominance   Dominant Hand: Right    Extremity/Trunk Assessment   Upper Extremity Assessment Upper Extremity Assessment: Defer to OT evaluation    Lower Extremity Assessment Lower Extremity Assessment: Generalized weakness    Cervical / Trunk Assessment Cervical / Trunk Assessment: Normal  Communication   Communication: No difficulties  Cognition Arousal/Alertness: Awake/alert Behavior During Therapy: WFL for tasks assessed/performed Overall Cognitive Status: Within Functional Limits for tasks assessed                                        General Comments      Exercises     Assessment/Plan    PT Assessment Patient needs continued PT services  PT Problem List Decreased strength;Decreased range of motion;Decreased activity tolerance;Decreased balance;Decreased mobility;Decreased knowledge of use of DME;Decreased safety awareness       PT Treatment Interventions DME instruction;Gait training;Stair training;Functional mobility training;Therapeutic activities;Therapeutic exercise;Balance training;Neuromuscular re-education;Patient/family education    PT Goals (Current goals can be found in the Care Plan section)  Acute Rehab PT Goals Patient Stated Goal: regain independence PT Goal Formulation: With patient Time For Goal Achievement: 08/14/17 Potential to Achieve Goals: Good    Frequency Min 4X/week   Barriers to discharge        Co-evaluation               AM-PAC PT "6 Clicks" Daily Activity  Outcome Measure Difficulty turning over in bed (including adjusting bedclothes, sheets and blankets)?: A Little Difficulty moving from lying on back to sitting on the side of the bed? : Unable Difficulty sitting down on and standing up from a chair with arms (e.g., wheelchair, bedside commode, etc,.)?: Unable Help needed moving  to and from a bed to chair (including a wheelchair)?: A Little Help needed walking in hospital room?: A Little Help needed climbing 3-5 steps with a railing? : A Lot 6 Click Score: 13    End of Session Equipment Utilized During Treatment: Gait belt Activity Tolerance: Patient tolerated treatment well Patient left: in chair;with call bell/phone within reach;with chair alarm set Nurse Communication: Mobility status PT Visit Diagnosis: Unsteadiness on feet (R26.81);Other abnormalities of gait and mobility (R26.89);Muscle weakness (generalized) (M62.81);History of falling (Z91.81)    Time: 4742-5956 PT Time Calculation (min) (ACUTE ONLY): 25 min   Charges:   PT Evaluation $PT Eval Moderate Complexity: 1 Mod PT Treatments $Gait Training: 8-22 mins   PT G Codes:        Lanney Gins, PT, DPT 07/31/17 2:24 PM Pager: [phone]

## 2017-07-31 NOTE — Evaluation (Signed)
Speech Language Pathology Evaluation Patient Details Name: Samantha Aguirre MRN: [id number] DOB: Jul 16, 1945 Today's Date: 07/31/2017 Time: 1324-4010 SLP Time Calculation (min) (ACUTE ONLY): 43 min  Problem List:  Patient Active Problem List   Diagnosis Date Noted  . Acute CVA (cerebrovascular accident) ([place]) 07/30/2017  . GI bleed 07/08/2017  . Anemia 07/08/2017  . Angiodysplasia of stomach and duodenum with hemorrhage   . CVA (cerebral vascular accident) ([place]) 07/06/2017  . Stroke (cerebrum) ([place])   . Goals of care, counseling/discussion   . Palliative care encounter   . COPD exacerbation ([place]) 04/03/2017  . Flu 03/27/2017  . Iron deficiency anemia due to chronic blood loss   . Migraine 02/13/2017  . Cataracts, bilateral 01/15/2017  . Chronic venous insufficiency 10/06/2016  . Recurrent major depressive disorder, in partial remission ([place]) 10/06/2016  . Left-sided weakness 10/03/2016  . Chest pain 02/27/2016  . History of cerebrovascular accident (CVA) with residual deficit 02/19/2016  . Thumb pain, left 01/22/2016  . Traumatic closed nondisplaced fracture of base of metacarpal bone of left thumb 01/22/2016  . Colon cancer screening 11/27/2015  . Depression 11/14/2015  . Coronary artery disease 11/13/2015  . Hypertension 11/13/2015  . History of skin cancer 11/13/2015  . Osteoporosis 11/13/2015  . Chronic low back pain 11/13/2015  . Left medial knee pain 11/13/2015  . Osteoarthritis of multiple joints 11/13/2015  . Other specified hypothyroidism 08/16/2015  . Controlled type 2 diabetes mellitus with diabetic neuropathy ([place]) 08/16/2015  . COPD (chronic obstructive pulmonary disease) ([place]) 08/16/2015  . Tobacco abuse 08/16/2015  . HLD (hyperlipidemia) 08/16/2015  . History of CHF (congestive heart failure) 08/16/2015  . Seizures ([place])    Past Medical History:  Past Medical History:  Diagnosis Date  . Acid reflux   . Arthritis    Bilateral knees, hands, feet  .  Asthma   . Atrial fibrillation ([place])   . Cancer ([place])   . CHF (congestive heart failure) ([facility])    Diastolic CHF  . COPD (chronic obstructive pulmonary disease) ([place])   . Coronary artery disease   . Diabetes mellitus without complication ([place])   . Frequent headaches   . GI bleed   . HLD (hyperlipidemia)   . Hypertension   . Seizures ([place])   . Skin cancer 2015   Suspected basal cell carcinoma on nose, surgically removed  . Stroke ([place]) 04/2017  . Thyroid disease   . Tremors of nervous system    Past Surgical History:  Past Surgical History:  Procedure Laterality Date  . ABDOMINAL HYSTERECTOMY  1976  . APPENDECTOMY  1980  . CAROTID ENDARTERECTOMY    . CHOLECYSTECTOMY  1980  . COLONOSCOPY N/A 02/20/2017   Procedure: COLONOSCOPY;  Surgeon: Lin Landsman, MD;  Location: [facility] ENDOSCOPY;  Service: Gastroenterology;  Laterality: N/A;  . ESOPHAGOGASTRODUODENOSCOPY N/A 02/20/2017   Procedure: ESOPHAGOGASTRODUODENOSCOPY (EGD);  Surgeon: Lin Landsman, MD;  Location[facility] END[facility]COPY;  Service: Gastroenterology;  Laterality: N/A;  . ESOPHAGOGASTRODUODENOSCOPY (EGD) WITH PROPOFOL N/A 07/08/2017   Procedure: ESOPHAGOGASTRODUODENOSCOPY (EGD) WITH PROPOFOL;  Surgeon: [name], MD;  Location: [facility] ENDOSCOPY;  Service: Endoscopy;  Laterality: N/A;  . [place]  . GIVENS CAPSULE STUDY N/A 07/09/2017   Procedure: GIVENS CAPSULE STUDY;  Surgeon: Jonathon Bellows, MD;  Location: [facility] ENDOSCOPY;  Service: Gastroenterology;  Laterality: N/A;  . KNEE SURGERY     left  . ROTATOR CUFF REPAIR     right  . TONSILLECTOMY  1950   HPI:  Pt is a  72 y.o. female with a known history of recurrent CVA(she had an acute right thalamic infarct in March 2019, Rt MCA April 2019 admitted with drooling, speech deficits.  Pt found to have acute internal capsule CVA on left, old Left MCA CVA, old right thalamic and cerebellar CVAs.  PMH + for Tobacco Heavily per chart, atrial fibrillation on eliquis,  severe iron deficiency anemia with local G.I. bleeding as for workup with colonoscopy and endoscopy in February 2019 on iron, vitamin B12 deficiency, COPD, tremors, coronary artery dx, tremors. Pt has been seen by SlP previously for swallow evaluations but not speech/language evals.  She passed an RNSSS here in the hospital.     Assessment / Plan / Recommendation Clinical Impression  Pt presents with nonfluent aphasia with word finding deficits resulting in dysfluency/pauses with expression.  This his worse than from her prior CVA per her statement.    Her understanding of language is intact for complex information.  Automatic speech tasks more fluent than novel.  Reading outloud is also mildly dysfluent but pt able to notice and self correct.  Comprehension of sentence level material was intact.  Pt compensates well for her word finding deficits by circumlocution, or pausing.    She expressed frustration with family and friends speaking "for her" therefore provided her with written information on cards to help family to understand her aphasia.  Provided pt with tasks to maximize her word finding deficits and advised environmental adaptions (one or two visitors at a time) with no background noise to maximize communication.  Pt admits she likes to read but is concerned re: complex and lengthy material - therefore advised starting with short stories.     As pt reports her baseline aphasia is exacerbated with this internal capsule CVA - highly recommend SLP follow up to help maximize communication.  Pt is very motivated and her main goal is increasing fluency (although she is also slightly dysarthric and slightly dysphonic also).  Thanks for this referral.     SLP Assessment  SLP Recommendation/Assessment: Patient needs continued Speech Lanaguage Pathology Services SLP Visit Diagnosis: Aphasia (R47.01)    Follow Up Recommendations  Inpatient Rehab    Frequency and Duration min 2x/week  1 week       SLP Evaluation Cognition  Overall Cognitive Status: Within Functional Limits for tasks assessed Arousal/Alertness: Awake/alert Orientation Level: Oriented to person;Oriented to place;Oriented to time;Oriented to situation Attention: Alternating Alternating Attention: Appears intact Memory: Appears intact Awareness: Appears intact Problem Solving: Appears intact Safety/Judgment: Appears intact       Comprehension  Auditory Comprehension Overall Auditory Comprehension: Appears within functional limits for tasks assessed Yes/No Questions: Not tested Commands: Within Functional Limits Conversation: Complex Visual Recognition/Discrimination Discrimination: Not tested Reading Comprehension Reading Status: Within funtional limits    Expression Expression Primary Mode of Expression: Verbal Verbal Expression Overall Verbal Expression: Impaired at baseline(but exacerbated with this event) Initiation: Impaired Automatic Speech: Name;Social Response;Counting;Singing Level of Generative/Spontaneous Verbalization: Sentence Repetition: Impaired Level of Impairment: Word level(multi syllabic more difficult) Naming: No impairment(naming items in room) Pragmatics: No impairment Non-Verbal Means of Communication: Not applicable(pt reports difficulty writing so unable to write legibly for communication ) Written Expression Dominant Hand: Right Written Expression: Not tested(due to pt's tremors)   Oral / Motor  Oral Motor/Sensory Function Overall Oral Motor/Sensory Function: Mild impairment(mild facial asymmetry, lingual deviation to left slight) Facial Symmetry: Abnormal symmetry left Lingual Strength: Suspected CN XII (hypoglossal) dysfunction;Reduced Velum: Within Functional Limits Mandible: (nt) Motor Speech Overall Motor  Speech: Impaired Respiration: Impaired Level of Impairment: Phrase Phonation: Hoarse Resonance: Within functional limits Articulation: Within functional  limitis Intelligibility: Intelligible Motor Planning: Impaired Motor Speech Errors: Groping for words;Inconsistent Interfering Components: Premorbid status Effective Techniques: Pause   GO                    Macario Golds 07/31/2017, 3:05 PM  Luanna Salk, [facility] SLP [phone]

## 2017-07-31 NOTE — Consult Note (Signed)
   [facility] CM Inpatient Consult   07/31/2017  Samantha Aguirre 1945/12/04 [id number]   Patient is currently active with [facility] for chronic disease management services.  Patient has been engaged by a Geologist, engineering and [facility] Education officer, museum.   Our community based plan of care has focused on disease management and community resource support.  Will follow up for disposition and needs.  Currently pursuing a skilled nursing facility or possible inpatient rehab.  A Palliative consult was noted also.  Will follow for needs as appropriate for community follow up. Speech Therapy in to speak with patient.  Will follow for disposition and needs. Patient primary care provider is with [facility] and this office is listed to provider the transition of care follow up.  Of note, [facility] Care Management services does not replace or interfere with any services that are needed or arranged by inpatient case management or social work.  For additional questions or referrals please contact:  Natividad Brood, RN BSN [facility] Liaison  [phone] business mobile phone Toll free office [phone]

## 2017-07-31 NOTE — Telephone Encounter (Signed)
No answer. Voicemail not set up to request messages.

## 2017-07-31 NOTE — Progress Notes (Addendum)
PROGRESS NOTE    Samantha Aguirre  JWJ:[id number] DOB: 03-Sep-1945 DOA: 07/30/2017 PCP: Tracie Harrier, MD    Brief Narrative:  Samantha Aguirre is a 72 y.o. female with medical history significant of CVA; HTN; HLD; CAD; DM; COPD; diastolic CHF; and afib on Coumadin presenting with code stroke.  She was recently hospitalized at [facility] for CVA.  She started noticing mouth drooping and creepy feeling and she's had it before.  The mouth droop started this AM.  Some difficulties with speech, word searching and expressive aphasia.  No extremity symptoms. No dysphagia.  +headache, frontal - it is still present.  She thought she was doing pretty well after her last hospitalization.  She was admitted at [facility] in March 2019 for COPD exacerbation and developed an acute stroke while hospitalized.  She returned to [facility] on 6/24 for facial droop and weakness.  She was evaluated for recurrent stroke, which appears to have been negative.  During that hospitalization, she had severe microcytic anemia and was thought to be have bleed.  She returned to the ER for melena on 7/3 and was discharged with stable Hgb.  She returned to the ER on 7/10 for chest pain and LE edema; her exam was not suggestive of volume overload.   ED Course:  H/o CVA, weakness. NIH 4, no tPA.  Recently admitted at [facility] for CVA.  Possible recrudescence.  Neurology has requested admission.     Assessment & Plan:   Principal Problem:   CVA (cerebral vascular accident) ([place]) Active Problems:   Other specified hypothyroidism   Controlled type 2 diabetes mellitus with diabetic neuropathy (HCC)   COPD (chronic obstructive pulmonary disease) (HCC)   Tobacco abuse   HLD (hyperlipidemia)   Hypertension   Anemia   Acute CVA (cerebrovascular accident) ([place])   1-Acute CVA;  Eliquis  stopped last month due to GI bleed. Patient had enteroscopy at [place] on 7-01 which showed  non-bleeding jejunal ulcer with no  stigmata of bleeding. A single non-bleeding angiodysplastic lesion in the  jejunum. Treated with argon plasma coagulation (APC). An area in the proximal jejunum was tattooed. -Stroke team recommend to contact patient gastroenterologist to see if we can resume eliquis.  -I spoke with Dr Marikay Alar, he recommend a formal GI consult to address question.  [facility] consulted.  -MRI confirm stroke.  PT, OT>  Discussed with DR Erlinda Hong, he spoke with patient, plan is to do aspirin 325 daily and follow up at [facility] [place] for evaluation of watchman device. I called  patient 's  PCP and left message to Dr Juliane Poot, asking her office to please do referral.   2-HTN; permissive HTN  3-HLD;  6/25 lipids: 123/34/56/164 -Continue Lipitor 80 mg daily   DM;  Hold Glucophage.   Anemia;  History of GI bleed.  Hb stable.   COPD; continue with ellipta  Hypothyroidism;  Continue with synthroid.   Tobacco dependence -Encourage cessation.  This was discussed with the patient and should be reviewed on an ongoing basis.   -Patch declined  Seizure d/o -Continue Keppra    DVT prophylaxis: SCD Code Status: DNR Family Communication: no family at bedside.  Disposition Plan: needs rehab.   Consultants:   GI  Neurology      Procedures:    Antimicrobials: none  Subjective: She is feeling stable. She presented with facial droop.   Objective: Vitals:   07/31/17 0421 07/31/17 0756 07/31/17 0918 07/31/17 1211  BP: 117/61 121/75    Pulse: Marland Kitchen)  54 84 (!) 59   Resp: 16 14 16    Temp: 98.3 F (36.8 C) 98.4 F (36.9 C)  98 F (36.7 C)  TempSrc: Oral Oral  Oral  SpO2: 97% 97% 97%   Weight:      Height:        Intake/Output Summary (Last 24 hours) at 07/31/2017 1356 Last data filed at 07/31/2017 1194 Gross per 24 hour  Intake 483.54 ml  Output 1700 ml  Net -1216.46 ml   Filed Weights   07/30/17 1000 07/30/17 1048  Weight: 83.3 kg (183 lb 10.3 oz) 83.3 kg (183 lb 10.3 oz)     Examination:  General exam: Appears calm and comfortable  Respiratory system: Clear to auscultation. Respiratory effort normal. Cardiovascular system: S1 & S2 heard, RRR. No JVD, murmurs, rubs, gallops or clicks. No pedal edema. Gastrointestinal system: Abdomen is nondistended, soft and nontender. No organomegaly or masses felt. Normal bowel sounds heard. Central nervous system: Alert and oriented. Motor strength 5/5, speech clear.  Extremities: Symmetric 5 x 5 power. Skin: No rashes, lesions or ulcers    Data Reviewed: I have personally reviewed following labs and imaging studies  CBC: Recent Labs  Lab 07/30/17 1007 07/30/17 1013 07/31/17 0810  WBC 6.7  --  6.8  NEUTROABS 4.1  --   --   HGB 9.7* 10.2* 10.3*  HCT 34.4* 30.0* 35.6*  MCV 82.7  --  81.3  PLT 294  --  174   Basic Metabolic Panel: Recent Labs  Lab 07/30/17 1007 07/30/17 1013 07/31/17 0810  NA 141 141 143  K 2.9* 2.7* 3.9  CL 107 104 109  CO2 25  --  23  GLUCOSE 205* 199* 133*  BUN 6* 4* 10  CREATININE 0.66 0.50 0.52  CALCIUM 8.4*  --  8.7*   GFR: Estimated Creatinine Clearance: 61.3 mL/min (by C-G formula based on SCr of 0.52 mg/dL). Liver Function Tests: Recent Labs  Lab 07/30/17 1007  AST 22  ALT 10  ALKPHOS 110  BILITOT 0.5  PROT 6.2*  ALBUMIN 3.4*   No results for input(s): LIPASE, AMYLASE in the last 168 hours. No results for input(s): AMMONIA in the last 168 hours. Coagulation Profile: Recent Labs  Lab 07/30/17 1007  INR 1.07   Cardiac Enzymes: No results for input(s): CKTOTAL, CKMB, CKMBINDEX, TROPONINI in the last 168 hours. BNP (last 3 results) No results for input(s): PROBNP in the last 8760 hours. HbA1C: Recent Labs    07/31/17 0342  HGBA1C 5.6   CBG: Recent Labs  Lab 07/30/17 1006 07/30/17 1838 07/30/17 2122 07/31/17 0624 07/31/17 1112  GLUCAP 195* 80 146* 136* 141*   Lipid Profile: Recent Labs    07/31/17 0342  CHOL 113  HDL 33*  LDLCALC 44  TRIG  179*  CHOLHDL 3.4   Thyroid Function Tests: No results for input(s): TSH, T4TOTAL, FREET4, T3FREE, THYROIDAB in the last 72 hours. Anemia Panel: No results for input(s): VITAMINB12, FOLATE, FERRITIN, TIBC, IRON, RETICCTPCT in the last 72 hours. Sepsis Labs: No results for input(s): PROCALCITON, LATICACIDVEN in the last 168 hours.  No results found for this or any previous visit (from the past 240 hour(s)).       Radiology Studies: Mr Virgel Paling YC Contrast  Result Date: 07/30/2017 CLINICAL DATA:  Acute onset LEFT-sided weakness during physical therapy. Similar symptoms a few weeks ago due to thalamic infarct. Assess for stroke. History of hypertension, hyperlipidemia, seizures, skin cancer, carotid endarterectomy. EXAM: MRI HEAD WITHOUT CONTRAST  MRA HEAD WITHOUT CONTRAST TECHNIQUE: Multiplanar, multiecho pulse sequences of the brain and surrounding structures were obtained without intravenous contrast. Angiographic images of the head were obtained using MRA technique without contrast. COMPARISON:  MRI head July 06, 2017 and MRA head April 06, 2017. FINDINGS: MRI HEAD FINDINGS INTRACRANIAL CONTENTS: 5 mm reduced diffusion with low ADC values LEFT posterior limb of the internal capsule. Patchy T2 shine through LEFT temporoparietal lobes with encephalomalacia and associated unchanged susceptibility artifact. No susceptibility artifact to suggest interval hemorrhage. Old bilateral small cerebellar infarcts. Moderate general parenchymal brain volume loss without hydrocephalus. Patchy to confluent supratentorial pontine white matter FLAIR T2 hyperintensities. No midline shift, mass effect. No abnormal extra-axial fluid collections. 1 cm RIGHT convexity low signal meningioma without mass effect. VASCULAR: Normal major intracranial vascular flow voids present at skull base. SKULL AND UPPER CERVICAL SPINE: No abnormal sellar expansion. No suspicious calvarial bone marrow signal. Craniocervical junction  maintained. SINUSES/ORBITS: The mastoid air-cells and included paranasal sinuses are well-aerated.The included ocular globes and orbital contents are non-suspicious. OTHER: None. MRA HEAD FINDINGS ANTERIOR CIRCULATION: Normal flow related enhancement of the included cervical, petrous, cavernous and supraclinoid internal carotid arteries. 2 mm laterally directed outpouching LEFT cavernous ICA. Patent anterior communicating artery. Patent anterior and middle cerebral arteries. Less conspicuous luminal irregularity of the middle cerebral arteries, prior stenosis attributable to motion artifact. No large vessel occlusion, flow limiting stenosis. POSTERIOR CIRCULATION: RIGHT vertebral artery is dominant. Vertebrobasilar arteries are patent. RIGHT posterior inferior cerebellar artery not visualized though this may be technical. Patent posterior cerebral arteries. Bilateral posterior communicating arteries present. Fetal origin LEFT posterior cerebral artery. No large vessel occlusion, flow limiting stenosis,  aneurysm. ANATOMIC VARIANTS: None. Source images and MIP images were reviewed. IMPRESSION: MRI HEAD: 1. Acute 5 mm LEFT internal capsule infarct. 2. Old LEFT MCA territory infarct. Old RIGHT thalamus lacunar infarct. Multiple old small cerebellar infarcts. 3. Stable moderate parenchymal brain volume loss and moderate chronic small vessel ischemic changes. MRA HEAD: 1. No emergent large vessel occlusion or flow-limiting stenosis. 2. Previously reported stenosis not apparent today; prior findings attributable to motion artifact. 3. 2 mm LEFT cavernous aneurysm versus infundibulum. Electronically Signed   By: Elon Alas M.D.   On: 07/30/2017 14:26   [name]  Result Date: 07/30/2017 CLINICAL DATA:  Acute onset LEFT-sided weakness during physical therapy. Similar symptoms a few weeks ago due to thalamic infarct. Assess for stroke. History of hypertension, hyperlipidemia, seizures, skin cancer,  carotid endarterectomy. EXAM: MRI HEAD WITHOUT CONTRAST MRA HEAD WITHOUT CONTRAST TECHNIQUE: Multiplanar, multiecho pulse sequences of the brain and surrounding structures were obtained without intravenous contrast. Angiographic images of the head were obtained using MRA technique without contrast. COMPARISON:  MRI head July 06, 2017 and MRA head April 06, 2017. FINDINGS: MRI HEAD FINDINGS INTRACRANIAL CONTENTS: 5 mm reduced diffusion with low ADC values LEFT posterior limb of the internal capsule. Patchy T2 shine through LEFT temporoparietal lobes with encephalomalacia and associated unchanged susceptibility artifact. No susceptibility artifact to suggest interval hemorrhage. Old bilateral small cerebellar infarcts. Moderate general parenchymal brain volume loss without hydrocephalus. Patchy to confluent supratentorial pontine white matter FLAIR T2 hyperintensities. No midline shift, mass effect. No abnormal extra-axial fluid collections. 1 cm RIGHT convexity low signal meningioma without mass effect. VASCULAR: Normal major intracranial vascular flow voids present at skull base. SKULL AND UPPER CERVICAL SPINE: No abnormal sellar expansion. No suspicious calvarial bone marrow signal. Craniocervical junction maintained. SINUSES/ORBITS: The mastoid air-cells and included paranasal sinuses are  well-aerated.The included ocular globes and orbital contents are non-suspicious. OTHER: None. MRA HEAD FINDINGS ANTERIOR CIRCULATION: Normal flow related enhancement of the included cervical, petrous, cavernous and supraclinoid internal carotid arteries. 2 mm laterally directed outpouching LEFT cavernous ICA. Patent anterior communicating artery. Patent anterior and middle cerebral arteries. Less conspicuous luminal irregularity of the middle cerebral arteries, prior stenosis attributable to motion artifact. No large vessel occlusion, flow limiting stenosis. POSTERIOR CIRCULATION: RIGHT vertebral artery is dominant.  Vertebrobasilar arteries are patent. RIGHT posterior inferior cerebellar artery not visualized though this may be technical. Patent posterior cerebral arteries. Bilateral posterior communicating arteries present. Fetal origin LEFT posterior cerebral artery. No large vessel occlusion, flow limiting stenosis,  aneurysm. ANATOMIC VARIANTS: None. Source images and MIP images were reviewed. IMPRESSION: MRI HEAD: 1. Acute 5 mm LEFT internal capsule infarct. 2. Old LEFT MCA territory infarct. Old RIGHT thalamus lacunar infarct. Multiple old small cerebellar infarcts. 3. Stable moderate parenchymal brain volume loss and moderate chronic small vessel ischemic changes. MRA HEAD: 1. No emergent large vessel occlusion or flow-limiting stenosis. 2. Previously reported stenosis not apparent today; prior findings attributable to motion artifact. 3. 2 mm LEFT cavernous aneurysm versus infundibulum. Electronically Signed   By: Elon Alas M.D.   On: 07/30/2017 14:26   Ct Head Code Stroke Wo Contrast  Result Date: 07/30/2017 CLINICAL DATA:  Code stroke. 72 year old female with left facial droop and loss of extremity sensation. Prior left MCA/PCA watershed infarct. EXAM: CT HEAD WITHOUT CONTRAST TECHNIQUE: Contiguous axial images were obtained from the base of the skull through the vertex without intravenous contrast. COMPARISON:  Brain MRI and head CT 07/06/2017 and earlier. FINDINGS: Brain: Chronic posterior left hemisphere encephalomalacia is stable. Small chronic cerebellar infarcts redemonstrated and stable. Patchy additional bilateral cerebral white matter hypodensity with involvement of the anterior deep white matter capsules appears stable. No midline shift, ventriculomegaly, mass effect, evidence of mass lesion, intracranial hemorrhage or evidence of cortically based acute infarction. Vascular: Calcified atherosclerosis at the skull base. No suspicious intracranial vascular hyperdensity. Skull: Stable, negative.  Sinuses/Orbits: Visualized paranasal sinuses and mastoids are stable and well pneumatized. Other: No acute orbit or scalp soft tissue findings. ASPECTS [facility] Stroke Program Early CT Score) - Ganglionic level infarction (caudate, lentiform nuclei, internal capsule, insula, M1-M3 cortex): 7 - Supraganglionic infarction (M4-M6 cortex): 3 Total score (0-10 with 10 being normal): 10 IMPRESSION: 1. No acute intracranial abnormality. Chronic ischemic disease appears stable by CT since 07/06/2017. 2. ASPECTS is 10. 3. These results were communicated to Dr. Rory Percy at 10:31 amon 7/18/2019by text page via the [facility] messaging system. Electronically Signed   By: Genevie Ann M.D.   On: 07/30/2017 10:31        Scheduled Meds: . aspirin EC  81 mg Oral Daily  . atorvastatin  80 mg Oral q1800  . DULoxetine  60 mg Oral QHS  . enoxaparin (LOVENOX) injection  40 mg Subcutaneous Q24H  . ferrous sulfate  325 mg Oral BID WC  . fluticasone  1 spray Each Nare Daily  . gabapentin  800 mg Oral BID  . insulin aspart  0-15 Units Subcutaneous TID WC  . levETIRAcetam  500 mg Oral BID  . levothyroxine  112 mcg Oral QAC breakfast  . pantoprazole  40 mg Oral BID  . sucralfate  1 g Oral TID WC & HS  . umeclidinium bromide  1 puff Inhalation Daily   Continuous Infusions: . sodium chloride 50 mL/hr at 07/31/17 0400     LOS: 1  day    Time spent: 35 minutes.     Elmarie Shiley, MD [facility] Pager [phone]  If 7PM-7AM, please contact night-coverage [email or web address] Password [email or web address] 07/31/2017, 1:56 PM

## 2017-07-31 NOTE — Consult Note (Signed)
Physical Medicine and Rehabilitation Consult   Reason for Consult:  Recurrent stroke.  Referring Physician: Dr. Tyrell Antonio.    HPI: Samantha Aguirre is a 72 y.o. female with history of CHF, T2DM, HTN, COPD, multiple CVAs as well as admission last month to  [facility] for GIB and taken of anticoagulation.  She was readmitted on 07/30/17 with facial droop and left sided weakness. MRI brain done revealing acute left internal capsule infarct and old L-MCA and right thalamic infarcts. GI felt that patient at high risk for rebleed on anticoagulation and Dr. Erlinda Hong recommended ASA with follow up at tertiary center for [facility] device. Therapy evaluations done revealing worsening of dysarthria as well as balance deficits. CIR recommended for follow up therapy.    Review of Systems  Constitutional: Negative for chills and fever.  HENT: Negative for hearing loss and tinnitus.   Eyes: Positive for blurred vision.  Respiratory: Positive for shortness of breath.   Cardiovascular: Positive for palpitations. Negative for chest pain.  Gastrointestinal: Positive for constipation and heartburn.  Genitourinary: Negative for dysuria and urgency.  Musculoskeletal: Positive for back pain, falls (multiple--due to lightheadness), joint pain (left knee painful and unsteady at times) and myalgias.  Skin: Negative for rash.  Neurological: Positive for dizziness, weakness and headaches.     Past Medical History:  Diagnosis Date  . Acid reflux   . Arthritis    Bilateral knees, hands, feet  . Asthma   . Atrial fibrillation ([place])   . Cancer ([place])   . CHF (congestive heart failure) (HCC)    Diastolic CHF  . COPD (chronic obstructive pulmonary disease) ([place])   . Coronary artery disease   . Diabetes mellitus without complication ([place])   . Frequent headaches   . GI bleed   . HLD (hyperlipidemia)   . Hypertension   . Seizures ([place])   . Skin cancer 2015   Suspected basal cell carcinoma on nose, surgically removed   . Stroke ([place]) 04/2017  . Thyroid disease   . Tremors of nervous system     Past Surgical History:  Procedure Laterality Date  . ABDOMINAL HYSTERECTOMY  1976  . APPENDECTOMY  1980  . CAROTID ENDARTERECTOMY    . CHOLECYSTECTOMY  1980  . COLONOSCOPY N/A 02/20/2017   Procedure: COLONOSCOPY;  Surgeon: Lin Landsman, MD;  Location: [facility] ENDOSCOPY;  Service: Gastroenterology;  Laterality: N/A;  . ESOPHAGOGASTRODUODENOSCOPY N/A 02/20/2017   Procedure: ESOPHAGOGASTRODUODENOSCOPY (EGD);  Surgeon: Lin Landsman, MD;  Location: [facility] ENDOSCOPY;  Service: Gastroenterology;  Laterality: N/A;  . ESOPHAGOGASTRODUODENOSCOPY (EGD) WITH PROPOFOL N/A 07/08/2017   Procedure: ESOPHAGOGASTRODUODENOSCOPY (EGD) WITH PROPOFOL;  Surgeon: [name], MD;  Location: [facility] ENDOSCOPY;  Service: Endoscopy;  Laterality: N/A;  . [place]  . GIVENS CAPSULE STUDY N/A 07/09/2017   Procedure: GIVENS CAPSULE STUDY;  Surgeon: Jonathon Bellows, MD;  Location: [facility] ENDOSCOPY;  Service: Gastroenterology;  Laterality: N/A;  . KNEE SURGERY     left  . ROTATOR CUFF REPAIR     right  . TONSILLECTOMY  1950    Family History  Problem Relation Age of Onset  . Breast cancer Mother   . Heart disease Mother   . Stroke Mother   . Cancer Mother   . COPD Mother   . Diabetes Mother   . Heart disease Father   . Diabetes Father   . Stroke Father   . Alcohol abuse Sister   . Drug abuse Sister   . Stroke Sister   .  Cancer Sister   . Mental illness Sister   . Heart disease Brother   . Arthritis Brother   . Diabetes Brother   . Heart disease Maternal Grandfather   . Heart disease Paternal Grandfather     Social History:  Lives alone. Uses cane in home and walker out of home. Sedentary PTA.  Daughter calls daily and neighbors help out. She  reports that she has been smoking cigarettes-1/2PPD.   She has a 60.00 pack-year smoking history. She has never used smokeless tobacco. She reports that she does not drink  alcohol or use drugs.    Allergies  Allergen Reactions  . Methotrexate Rash  . Mushroom Extract Complex Anaphylaxis    Made patient "deathly sick"  . Atenolol     "MD took me off of it bc it was doing something wrong" Made patient HYPOTENSIVE  . Ivp Dye [Iodinated Diagnostic Agents] Itching    Pt injected with IV contrast.  5 min after injection pt complained of itching behind ear and on her abd.  . Morphine And Related Other (See Comments)    "stops my breathing" NEAR-RESPIRATORY ARREST  . Oxycodone Hcl   . Percocet [Oxycodone-Acetaminophen] Nausea And Vomiting and Other (See Comments)    Stomach pains  . Percodan [Oxycodone-Aspirin] Nausea And Vomiting and Other (See Comments)    Stomach pains  . Verapamil Other (See Comments)    " MD told me this was screwing me up" MAKES PATIENT HYPOTENSIVE   Medications Prior to Admission  Medication Sig Dispense Refill  . albuterol (PROVENTIL HFA;VENTOLIN HFA) 108 (90 Base) MCG/ACT inhaler Inhale 2 puffs into the lungs every 6 (six) hours as needed for wheezing or shortness of breath. 3 Inhaler 1  . aspirin EC 81 MG tablet Take 1 tablet (81 mg total) by mouth daily. 30 tablet 2  . atorvastatin (LIPITOR) 80 MG tablet Take 1 tablet (80 mg total) by mouth daily at 6 PM. 90 tablet 3  . diclofenac sodium (VOLTAREN) 1 % GEL Apply 2 g topically 4 (four) times daily. (Patient taking differently: Apply 2 g topically daily as needed. ) 1 Tube 1  . DULoxetine (CYMBALTA) 60 MG capsule Take 1 capsule (60 mg total) by mouth at bedtime. 30 capsule 11  . ferrous sulfate 325 (65 FE) MG EC tablet Take 1 tablet (325 mg total) by mouth 2 (two) times daily with a meal. 60 tablet 0  . fluticasone (FLONASE) 50 MCG/ACT nasal spray Place 1 spray into both nostrils daily. 48 g 3  . furosemide (LASIX) 20 MG tablet Take 20 mg by mouth daily.  30 tablet 3  . gabapentin (NEURONTIN) 800 MG tablet Take 1 tablet (800 mg total) by mouth 2 (two) times daily. 180 tablet 1  .  glipiZIDE-metformin (METAGLIP) 5-500 MG tablet Take 1 tablet by mouth 2 (two) times daily before a meal. 180 tablet 1  . levETIRAcetam (KEPPRA) 500 MG tablet Take 1 tablet (500 mg total) by mouth 2 (two) times daily. 60 tablet 0  . levothyroxine (SYNTHROID, LEVOTHROID) 175 MCG tablet Take 1 tablet (175 mcg total) by mouth daily before breakfast. (Patient taking differently: Take 112 mcg by mouth daily before breakfast. ) 90 tablet 3  . lisinopril (PRINIVIL,ZESTRIL) 20 MG tablet Take 20 mg by mouth daily.  3  . nitroGLYCERIN (NITROSTAT) 0.4 MG SL tablet Place 1 tablet (0.4 mg total) under the tongue every 5 (five) minutes as needed for chest pain. 30 tablet 0  . omeprazole (PRILOSEC) 20  MG capsule Take 20 mg by mouth 2 (two) times daily.     . potassium chloride (MICRO-K) 10 MEQ CR capsule Take 1 capsule (10 mEq total) by mouth daily. (Patient taking differently: Take 20 mEq by mouth 2 (two) times daily. ) 90 capsule 3  . sucralfate (CARAFATE) 1 g tablet TAKE (1) TABLET BY MOUTH FOUR TIMES A DAY 360 tablet 1  . SUMAtriptan (IMITREX) 25 MG tablet Take 25 mg by mouth as needed.    . umeclidinium bromide (INCRUSE ELLIPTA) 62.5 MCG/INH AEPB Inhale 1 puff into the lungs daily. 1 each 11  . vitamin B-12 (CYANOCOBALAMIN) 1000 MCG tablet Take 1 tablet by mouth daily.  1  . ELIQUIS 5 MG TABS tablet Take 5 mg by mouth every 12 (twelve) hours.  3    Home: Home Living Family/patient expects to be discharged to:: Private residence Living Arrangements: Alone Available Help at Discharge: Friend(s), Available PRN/intermittently Type of Home: Apartment Home Access: Level entry Home Layout: One level Bathroom Shower/Tub: Chiropodist: Standard Bathroom Accessibility: Yes Home Equipment: Environmental consultant - 2 wheels, Cane - quad, Shower seat, Financial controller: Reacher Additional Comments: No using power chair due to 2" step at egress of new apartment   Functional History: Prior  Function Level of Independence: Independent with assistive device(s), Needs assistance Gait / Transfers Assistance Needed: Pt able to do in home and limited community ambulation (cane in home, walker out of home, does use scooter while shopping). Comments: patient reporting she has been wokring with HH therapies for some time Functional Status:  Mobility: Bed Mobility Overal bed mobility: Needs Assistance Bed Mobility: Supine to Sit Supine to sit: Min assist, Min guard General bed mobility comments: for line management and trunk control as patient reports dizziness upon initial sitting EOB Transfers Overall transfer level: Needs assistance Equipment used: Rolling walker (2 wheeled) Transfers: Sit to/from Stand Sit to Stand: Min guard General transfer comment: very light Min A to power up at bedside and from low toilet. Prefers a forward flexed posture with transfers requiring verbal cueing for upright Ambulation/Gait Ambulation/Gait assistance: Min guard Gait Distance (Feet): 30 Feet Assistive device: Rolling walker (2 wheeled) Gait Pattern/deviations: Step-to pattern, Step-through pattern, Decreased stride length, Trunk flexed General Gait Details: Min guard for short ambulation distance in room; verbal cueing for safety and sequencing; taking 1 standing rest break due to feeling light headed with VSS throughout Gait velocity: decreased    ADL: ADL Overall ADL's : Needs assistance/impaired Grooming: Standing, Wash/dry hands, Min guard Upper Body Bathing: Supervision/ safety, Sitting Lower Body Bathing: Min guard, Sit to/from stand Upper Body Dressing : Supervision/safety, Sitting Lower Body Dressing: Min guard, Sit to/from stand Toilet Transfer: Min guard, Ambulation, RW Toileting- Clothing Manipulation and Hygiene: Min guard, Sit to/from stand General ADL Comments: Pt reports receiving HHOT and PT prior to admission.  Currently able to complete simulated selfcare tasks at a min  guard to min assist level with use of the RW for safety.  Has shower seat at home as well as vanity next to the toilet she would use for sit to stand.  She reports feeling close to her functional level prior to this CVA.  Cognition: Cognition Overall Cognitive Status: Within Functional Limits for tasks assessed Arousal/Alertness: Awake/alert Orientation Level: Oriented X4 Attention: Alternating Alternating Attention: Appears intact Memory: Appears intact Awareness: Appears intact Problem Solving: Appears intact Safety/Judgment: Appears intact Cognition Arousal/Alertness: Awake/alert Behavior During Therapy: WFL for tasks assessed/performed Overall Cognitive Status: Within  Functional Limits for tasks assessed   Blood pressure 121/75, pulse (P) 66, temperature 98 F (36.7 C), temperature source Oral, resp. rate 16, height 5\' 1"  (1.549 m), weight 83.3 kg (183 lb 10.3 oz), SpO2 (P) 97 %. Physical Exam  Nursing note and vitals reviewed. Eyes: Pupils are equal, round, and reactive to light.  Neck: Normal range of motion.  Cardiovascular: Normal rate.  Respiratory: Effort normal.  GI: Soft.  Musculoskeletal: She exhibits no edema.  Neurological:  Speech with minimal dysarthria. Able to follow simple commands without difficulty. Left sided weakness.  Skin: Skin is warm.    Results for orders placed or performed during the hospital encounter of 07/30/17 (from the past 24 hour(s))  Glucose, capillary     Status: None   Collection Time: 07/30/17  6:38 PM  Result Value Ref Range   Glucose-Capillary 80 70 - 99 mg/dL   Comment 1 Notify RN    Comment 2 Document in Chart   Glucose, capillary     Status: Abnormal   Collection Time: 07/30/17  9:22 PM  Result Value Ref Range   Glucose-Capillary 146 (H) 70 - 99 mg/dL   Comment 1 Notify RN    Comment 2 Document in Chart   Lipid panel     Status: Abnormal   Collection Time: 07/31/17  3:42 AM  Result Value Ref Range   Cholesterol 113 0 -  200 mg/dL   Triglycerides 179 (H) <150 mg/dL   HDL 33 (L) >40 mg/dL   Total CHOL/HDL Ratio 3.4 RATIO   VLDL 36 0 - 40 mg/dL   LDL Cholesterol 44 0 - 99 mg/dL  Hemoglobin A1c     Status: None   Collection Time: 07/31/17  3:42 AM  Result Value Ref Range   Hgb A1c MFr Bld 5.6 4.8 - 5.6 %   Mean Plasma Glucose 114.02 mg/dL  Glucose, capillary     Status: Abnormal   Collection Time: 07/31/17  6:24 AM  Result Value Ref Range   Glucose-Capillary 136 (H) 70 - 99 mg/dL   Comment 1 Notify RN    Comment 2 Document in Chart   Basic metabolic panel     Status: Abnormal   Collection Time: 07/31/17  8:10 AM  Result Value Ref Range   Sodium 143 135 - 145 mmol/L   Potassium 3.9 3.5 - 5.1 mmol/L   Chloride 109 98 - 111 mmol/L   CO2 23 22 - 32 mmol/L   Glucose, Bld 133 (H) 70 - 99 mg/dL   BUN 10 8 - 23 mg/dL   Creatinine, Ser 0.52 0.44 - 1.00 mg/dL   Calcium 8.7 (L) 8.9 - 10.3 mg/dL   GFR calc non Af Amer >60 >60 mL/min   GFR calc Af Amer >60 >60 mL/min   Anion gap 11 5 - 15  CBC     Status: Abnormal   Collection Time: 07/31/17  8:10 AM  Result Value Ref Range   WBC 6.8 4.0 - 10.5 K/uL   RBC 4.38 3.87 - 5.11 MIL/uL   Hemoglobin 10.3 (L) 12.0 - 15.0 g/dL   HCT 35.6 (L) 36.0 - 46.0 %   MCV 81.3 78.0 - 100.0 fL   MCH 23.5 (L) 26.0 - 34.0 pg   MCHC 28.9 (L) 30.0 - 36.0 g/dL   RDW 23.4 (H) 11.5 - 15.5 %   Platelets 271 150 - 400 K/uL  Glucose, capillary     Status: Abnormal   Collection Time: 07/31/17 11:12 AM  Result Value Ref Range   Glucose-Capillary 141 (H) 70 - 99 mg/dL  Glucose, capillary     Status: Abnormal   Collection Time: 07/31/17  4:18 PM  Result Value Ref Range   Glucose-Capillary 137 (H) 70 - 99 mg/dL   Mr [facility] Wo Contrast  Result Date: 07/30/2017 CLINICAL DATA:  Acute onset LEFT-sided weakness during physical therapy. Similar symptoms a few weeks ago due to thalamic infarct. Assess for stroke. History of hypertension, hyperlipidemia, seizures, skin cancer, carotid  endarterectomy. EXAM: MRI HEAD WITHOUT CONTRAST MRA HEAD WITHOUT CONTRAST TECHNIQUE: Multiplanar, multiecho pulse sequences of the brain and surrounding structures were obtained without intravenous contrast. Angiographic images of the head were obtained using MRA technique without contrast. COMPARISON:  MRI head July 06, 2017 and MRA head April 06, 2017. FINDINGS: MRI HEAD FINDINGS INTRACRANIAL CONTENTS: 5 mm reduced diffusion with low ADC values LEFT posterior limb of the internal capsule. Patchy T2 shine through LEFT temporoparietal lobes with encephalomalacia and associated unchanged susceptibility artifact. No susceptibility artifact to suggest interval hemorrhage. Old bilateral small cerebellar infarcts. Moderate general parenchymal brain volume loss without hydrocephalus. Patchy to confluent supratentorial pontine white matter FLAIR T2 hyperintensities. No midline shift, mass effect. No abnormal extra-axial fluid collections. 1 cm RIGHT convexity low signal meningioma without mass effect. VASCULAR: Normal major intracranial vascular flow voids present at skull base. SKULL AND UPPER CERVICAL SPINE: No abnormal sellar expansion. No suspicious calvarial bone marrow signal. Craniocervical junction maintained. SINUSES/ORBITS: The mastoid air-cells and included paranasal sinuses are well-aerated.The included ocular globes and orbital contents are non-suspicious. OTHER: None. MRA HEAD FINDINGS ANTERIOR CIRCULATION: Normal flow related enhancement of the included cervical, petrous, cavernous and supraclinoid internal carotid arteries. 2 mm laterally directed outpouching LEFT cavernous ICA. Patent anterior communicating artery. Patent anterior and middle cerebral arteries. Less conspicuous luminal irregularity of the middle cerebral arteries, prior stenosis attributable to motion artifact. No large vessel occlusion, flow limiting stenosis. POSTERIOR CIRCULATION: RIGHT vertebral artery is dominant. Vertebrobasilar  arteries are patent. RIGHT posterior inferior cerebellar artery not visualized though this may be technical. Patent posterior cerebral arteries. Bilateral posterior communicating arteries present. Fetal origin LEFT posterior cerebral artery. No large vessel occlusion, flow limiting stenosis,  aneurysm. ANATOMIC VARIANTS: None. Source images and MIP images were reviewed. IMPRESSION: MRI HEAD: 1. Acute 5 mm LEFT internal capsule infarct. 2. Old LEFT MCA territory infarct. Old RIGHT thalamus lacunar infarct. Multiple old small cerebellar infarcts. 3. Stable moderate parenchymal brain volume loss and moderate chronic small vessel ischemic changes. MRA HEAD: 1. No emergent large vessel occlusion or flow-limiting stenosis. 2. Previously reported stenosis not apparent today; prior findings attributable to motion artifact. 3. 2 mm LEFT cavernous aneurysm versus infundibulum. Electronically Signed   By: Elon Alas M.D.   On: 07/30/2017 14:26   [name]  Result Date: 07/30/2017 CLINICAL DATA:  Acute onset LEFT-sided weakness during physical therapy. Similar symptoms a few weeks ago due to thalamic infarct. Assess for stroke. History of hypertension, hyperlipidemia, seizures, skin cancer, carotid endarterectomy. EXAM: MRI HEAD WITHOUT CONTRAST MRA HEAD WITHOUT CONTRAST TECHNIQUE: Multiplanar, multiecho pulse sequences of the brain and surrounding structures were obtained without intravenous contrast. Angiographic images of the head were obtained using MRA technique without contrast. COMPARISON:  MRI head July 06, 2017 and MRA head April 06, 2017. FINDINGS: MRI HEAD FINDINGS INTRACRANIAL CONTENTS: 5 mm reduced diffusion with low ADC values LEFT posterior limb of the internal capsule. Patchy T2 shine through LEFT temporoparietal lobes with encephalomalacia and  associated unchanged susceptibility artifact. No susceptibility artifact to suggest interval hemorrhage. Old bilateral small cerebellar infarcts.  Moderate general parenchymal brain volume loss without hydrocephalus. Patchy to confluent supratentorial pontine white matter FLAIR T2 hyperintensities. No midline shift, mass effect. No abnormal extra-axial fluid collections. 1 cm RIGHT convexity low signal meningioma without mass effect. VASCULAR: Normal major intracranial vascular flow voids present at skull base. SKULL AND UPPER CERVICAL SPINE: No abnormal sellar expansion. No suspicious calvarial bone marrow signal. Craniocervical junction maintained. SINUSES/ORBITS: The mastoid air-cells and included paranasal sinuses are well-aerated.The included ocular globes and orbital contents are non-suspicious. OTHER: None. MRA HEAD FINDINGS ANTERIOR CIRCULATION: Normal flow related enhancement of the included cervical, petrous, cavernous and supraclinoid internal carotid arteries. 2 mm laterally directed outpouching LEFT cavernous ICA. Patent anterior communicating artery. Patent anterior and middle cerebral arteries. Less conspicuous luminal irregularity of the middle cerebral arteries, prior stenosis attributable to motion artifact. No large vessel occlusion, flow limiting stenosis. POSTERIOR CIRCULATION: RIGHT vertebral artery is dominant. Vertebrobasilar arteries are patent. RIGHT posterior inferior cerebellar artery not visualized though this may be technical. Patent posterior cerebral arteries. Bilateral posterior communicating arteries present. Fetal origin LEFT posterior cerebral artery. No large vessel occlusion, flow limiting stenosis,  aneurysm. ANATOMIC VARIANTS: None. Source images and MIP images were reviewed. IMPRESSION: MRI HEAD: 1. Acute 5 mm LEFT internal capsule infarct. 2. Old LEFT MCA territory infarct. Old RIGHT thalamus lacunar infarct. Multiple old small cerebellar infarcts. 3. Stable moderate parenchymal brain volume loss and moderate chronic small vessel ischemic changes. MRA HEAD: 1. No emergent large vessel occlusion or flow-limiting  stenosis. 2. Previously reported stenosis not apparent today; prior findings attributable to motion artifact. 3. 2 mm LEFT cavernous aneurysm versus infundibulum. Electronically Signed   By: Elon Alas M.D.   On: 07/30/2017 14:26   Ct Head Code Stroke Wo Contrast  Result Date: 07/30/2017 CLINICAL DATA:  Code stroke. 72 year old female with left facial droop and loss of extremity sensation. Prior left MCA/PCA watershed infarct. EXAM: CT HEAD WITHOUT CONTRAST TECHNIQUE: Contiguous axial images were obtained from the base of the skull through the vertex without intravenous contrast. COMPARISON:  Brain MRI and head CT 07/06/2017 and earlier. FINDINGS: Brain: Chronic posterior left hemisphere encephalomalacia is stable. Small chronic cerebellar infarcts redemonstrated and stable. Patchy additional bilateral cerebral white matter hypodensity with involvement of the anterior deep white matter capsules appears stable. No midline shift, ventriculomegaly, mass effect, evidence of mass lesion, intracranial hemorrhage or evidence of cortically based acute infarction. Vascular: Calcified atherosclerosis at the skull base. No suspicious intracranial vascular hyperdensity. Skull: Stable, negative. Sinuses/Orbits: Visualized paranasal sinuses and mastoids are stable and well pneumatized. Other: No acute orbit or scalp soft tissue findings. ASPECTS [facility] Stroke Program Early CT Score) - Ganglionic level infarction (caudate, lentiform nuclei, internal capsule, insula, M1-M3 cortex): 7 - Supraganglionic infarction (M4-M6 cortex): 3 Total score (0-10 with 10 being normal): 10 IMPRESSION: 1. No acute intracranial abnormality. Chronic ischemic disease appears stable by CT since 07/06/2017. 2. ASPECTS is 10. 3. These results were communicated to Dr. Rory Percy at 10:31 amon 7/18/2019by text page via the [facility] messaging system. Electronically Signed   By: Genevie Ann M.D.   On: 07/30/2017 10:31     Assessment/Plan: Diagnosis: new  left internal capsule infarct in presence of prior left MCA and right thalamic infarcts 1. Does the need for close, 24 hr/day medical supervision in concert with the patient's rehab needs make it unreasonable for this patient to be served in a less  intensive setting? Yes 2. Co-Morbidities requiring supervision/potential complications: chf, copd, dm2, GIB, seizures 3. Due to bladder management, bowel management, safety, skin/wound care, disease management, medication administration, pain management and patient education, does the patient require 24 hr/day rehab nursing? Yes 4. Does the patient require coordinated care of a physician, rehab nurse, PT (1-2 hrs/day, 5 days/week) and OT (1-2 hrs/day, 5 days/week) to address physical and functional deficits in the context of the above medical diagnosis(es)? Yes Addressing deficits in the following areas: balance, endurance, locomotion, strength, transferring, bowel/bladder control, bathing, dressing, feeding, grooming, toileting and psychosocial support 5. Can the patient actively participate in an intensive therapy program of at least 3 hrs of therapy per day at least 5 days per week? Yes 6. The potential for patient to make measurable gains while on inpatient rehab is excellent 7. Anticipated functional outcomes upon discharge from inpatient rehab are modified independent  with PT, modified independent with OT, n/a with SLP. 8. Estimated rehab length of stay to reach the above functional goals is: 7 days 9. Anticipated D/C setting: Home 10. Anticipated post D/C treatments: [place] therapy 11. Overall Rehab/Functional Prognosis: excellent  RECOMMENDATIONS: This patient's condition is appropriate for continued rehabilitative care in the following setting: brief CIR admission vs home with home health Patient has agreed to participate in recommended program. Yes and Potentially Note that insurance prior authorization may be required for reimbursement for  recommended care.  Comment: Rehab Admissions Coordinator to follow up.  Thanks,  Meredith Staggers, MD, Mellody Drown     Bary Leriche, PA-C 07/31/2017

## 2017-08-01 LAB — GLUCOSE, CAPILLARY
GLUCOSE-CAPILLARY: 147 mg/dL — AB (ref 70–99)
GLUCOSE-CAPILLARY: 184 mg/dL — AB (ref 70–99)
GLUCOSE-CAPILLARY: 126 mg/dL — AB (ref 70–99)
Glucose-Capillary: 183 mg/dL — ABNORMAL HIGH (ref 70–99)
Glucose-Capillary: 133 mg/dL — ABNORMAL HIGH (ref 70–99)

## 2017-08-01 NOTE — Progress Notes (Addendum)
PROGRESS NOTE    Samantha Aguirre  JJK:[id number] DOB: 09/13/1945 DOA: 07/30/2017 PCP: Tracie Harrier, MD    Brief Narrative:  Samantha Aguirre is a 72 y.o. female with medical history significant of CVA; HTN; HLD; CAD; DM; COPD; diastolic CHF; and afib on Coumadin presenting with code stroke.  She was recently hospitalized at [facility] for CVA.  She started noticing mouth drooping and creepy feeling and she's had it before.  The mouth droop started this AM.  Some difficulties with speech, word searching and expressive aphasia.  No extremity symptoms. No dysphagia.  +headache, frontal - it is still present.  She thought she was doing pretty well after her last hospitalization.  She was admitted at [facility] in March 2019 for COPD exacerbation and developed an acute stroke while hospitalized.  She returned to [facility] on 6/24 for facial droop and weakness.  She was evaluated for recurrent stroke, which appears to have been negative.  During that hospitalization, she had severe microcytic anemia and was thought to be have bleed.  She returned to the ER for melena on 7/3 and was discharged with stable Hgb.  She returned to the ER on 7/10 for chest pain and LE edema; her exam was not suggestive of volume overload.   ED Course:  H/o CVA, weakness. NIH 4, no tPA.  Recently admitted at [facility] for CVA.  Possible recrudescence.  Neurology has requested admission.     Assessment & Plan:   Principal Problem:   CVA (cerebral vascular accident) ([place]) Active Problems:   Other specified hypothyroidism   Controlled type 2 diabetes mellitus with diabetic neuropathy (HCC)   COPD (chronic obstructive pulmonary disease) (HCC)   Tobacco abuse   HLD (hyperlipidemia)   Seizure disorder (HCC)   Hypertension   History of stroke   Anemia   Acute CVA (cerebrovascular accident) ([place])   PAF (paroxysmal atrial fibrillation) ([place])   1-Acute CVA;  Eliquis  stopped last month due to GI bleed.  Patient had enteroscopy at [place] on 7-01 which showed  non-bleeding jejunal ulcer with no stigmata of bleeding. A single non-bleeding angiodysplastic lesion in the  jejunum. Treated with argon plasma coagulation (APC). An area in the proximal jejunum was tattooed. -Stroke team recommend to contact patient gastroenterologist to see if we can resume eliquis.  -I spoke with Dr Marikay Alar, he recommend a formal GI consult to address question.  [facility] consulted.  -MRI confirm stroke.  PT, OT> recommending CIR.  Discussed with DR Erlinda Hong, he spoke with patient, plan is to do aspirin 325 daily and follow up at [facility], [place] for evaluation of watchman device. I called  patient 's  PCP and left message to Dr Juliane Poot, asking her office to please do referral.  -awaiting insurance approval for CIR>  -had choking episode today twice while eating breakfast . NPO, speech evaluation.   2-HTN; permissive HTN  3-HLD;  6/25 lipids: 123/34/56/164 -Continue Lipitor 80 mg daily   DM;  Hold Glucophage.  SSI  Anemia;  History of GI bleed.  Hb stable.   COPD; continue with ellipta  Hypothyroidism;  Continue with synthroid.   Tobacco dependence -Encourage cessation.  This was discussed with the patient and should be reviewed on an ongoing basis.   -Patch declined  Seizure d/o -Continue Keppra    DVT prophylaxis: SCD Code Status: DNR Family Communication: no family at bedside.  Disposition Plan: needs rehab.   Consultants:   GI  Neurology  Procedures:    Antimicrobials: none  Subjective: She is doing ok, she choke eating cereal.     Objective: Vitals:   08/01/17 0402 08/01/17 0825 08/01/17 0835 08/01/17 1229  BP: 132/68 (!) 130/59  126/80  Pulse: 65 (!) 57  62  Resp: 20 15  17   Temp: 98.5 F (36.9 C) 97.8 F (36.6 C)  98.2 F (36.8 C)  TempSrc:    Oral  SpO2: 96% 100% 99% 98%  Weight:      Height:        Intake/Output Summary (Last 24 hours) at 08/01/2017  1413 Last data filed at 08/01/2017 1012 Gross per 24 hour  Intake 1059.04 ml  Output -  Net 1059.04 ml   Filed Weights   07/30/17 1000 07/30/17 1048  Weight: 83.3 kg (183 lb 10.3 oz) 83.3 kg (183 lb 10.3 oz)    Examination:  General exam: NAD Respiratory system: CTA Cardiovascular system: S 1, S 2 RRR Gastrointestinal system: BS present, soft, nt Central nervous system: alert oriented, speech clear.  Extremities: symmetric power.  Skin: No rashes, lesions or ulcers    Data Reviewed: I have personally reviewed following labs and imaging studies  CBC: Recent Labs  Lab 07/30/17 1007 07/30/17 1013 07/31/17 0810  WBC 6.7  --  6.8  NEUTROABS 4.1  --   --   HGB 9.7* 10.2* 10.3*  HCT 34.4* 30.0* 35.6*  MCV 82.7  --  81.3  PLT 294  --  034   Basic Metabolic Panel: Recent Labs  Lab 07/30/17 1007 07/30/17 1013 07/31/17 0810  NA 141 141 143  K 2.9* 2.7* 3.9  CL 107 104 109  CO2 25  --  23  GLUCOSE 205* 199* 133*  BUN 6* 4* 10  CREATININE 0.66 0.50 0.52  CALCIUM 8.4*  --  8.7*   GFR: Estimated Creatinine Clearance: 61.3 mL/min (by C-G formula based on SCr of 0.52 mg/dL). Liver Function Tests: Recent Labs  Lab 07/30/17 1007  AST 22  ALT 10  ALKPHOS 110  BILITOT 0.5  PROT 6.2*  ALBUMIN 3.4*   No results for input(s): LIPASE, AMYLASE in the last 168 hours. No results for input(s): AMMONIA in the last 168 hours. Coagulation Profile: Recent Labs  Lab 07/30/17 1007  INR 1.07   Cardiac Enzymes: No results for input(s): CKTOTAL, CKMB, CKMBINDEX, TROPONINI in the last 168 hours. BNP (last 3 results) No results for input(s): PROBNP in the last 8760 hours. HbA1C: Recent Labs    07/31/17 0342  HGBA1C 5.6   CBG: Recent Labs  Lab 07/31/17 2051 08/01/17 0611 08/01/17 0840 08/01/17 0841 08/01/17 1227  GLUCAP 146* 147* 183* 184* 126*   Lipid Profile: Recent Labs    07/31/17 0342  CHOL 113  HDL 33*  LDLCALC 44  TRIG 179*  CHOLHDL 3.4   Thyroid  Function Tests: No results for input(s): TSH, T4TOTAL, FREET4, T3FREE, THYROIDAB in the last 72 hours. Anemia Panel: No results for input(s): VITAMINB12, FOLATE, FERRITIN, TIBC, IRON, RETICCTPCT in the last 72 hours. Sepsis Labs: No results for input(s): PROCALCITON, LATICACIDVEN in the last 168 hours.  No results found for this or any previous visit (from the past 240 hour(s)).       Radiology Studies: No results found.      Scheduled Meds: . aspirin  325 mg Oral Daily  . atorvastatin  80 mg Oral q1800  . DULoxetine  60 mg Oral QHS  . feeding supplement (GLUCERNA SHAKE)  237 mL  Oral BID BM  . ferrous sulfate  325 mg Oral BID WC  . fluticasone  1 spray Each Nare Daily  . gabapentin  800 mg Oral BID  . insulin aspart  0-15 Units Subcutaneous TID WC  . levETIRAcetam  500 mg Oral BID  . levothyroxine  112 mcg Oral QAC breakfast  . pantoprazole  40 mg Oral BID  . sucralfate  1 g Oral TID WC & HS  . umeclidinium bromide  1 puff Inhalation Daily   Continuous Infusions: . sodium chloride 50 mL/hr at 08/01/17 1124     LOS: 2 days    Time spent: 35 minutes.     Elmarie Shiley, MD [facility] Pager [phone]  If 7PM-7AM, please contact night-coverage [email or web address] Password [email or web address] 08/01/2017, 2:13 PM

## 2017-08-01 NOTE — Progress Notes (Signed)
Patient choked twice this morning, once on cereal then on morning medications. Patient placed on NPO diet and speech eval was placed. Dr. Tyrell Antonio paged and made aware.

## 2017-08-01 NOTE — Evaluation (Addendum)
Clinical/Bedside Swallow Evaluation Patient Details  Name: Samantha Aguirre MRN: [id number] Date of Birth: 1945-09-05  Today's Date: 08/01/2017 Time: SLP Start Time (ACUTE ONLY): 1438 SLP Stop Time (ACUTE ONLY): 1452 SLP Time Calculation (min) (ACUTE ONLY): 14 min  Past Medical History:  Past Medical History:  Diagnosis Date  . Acid reflux   . Arthritis    Bilateral knees, hands, feet  . Asthma   . Atrial fibrillation ([place])   . Cancer ([place])   . CHF (congestive heart failure) (HCC)    Diastolic CHF  . COPD (chronic obstructive pulmonary disease) ([place])   . Coronary artery disease   . Diabetes mellitus without complication ([place])   . Frequent headaches   . GI bleed   . HLD (hyperlipidemia)   . Hypertension   . Seizures ([place])   . Skin cancer 2015   Suspected basal cell carcinoma on nose, surgically removed  . Stroke ([place]) 04/2017  . Thyroid disease   . Tremors of nervous system    Past Surgical History:  Past Surgical History:  Procedure Laterality Date  . ABDOMINAL HYSTERECTOMY  1976  . APPENDECTOMY  1980  . CAROTID ENDARTERECTOMY    . CHOLECYSTECTOMY  1980  . COLONOSCOPY N/A 02/20/2017   Procedure: COLONOSCOPY;  Surgeon: Lin Landsman, MD;  Location: [facility] ENDOSCOPY;  Service: Gastroenterology;  Laterality: N/A;  . ESOPHAGOGASTRODUODENOSCOPY N/A 02/20/2017   Procedure: ESOPHAGOGASTRODUODENOSCOPY (EGD);  Surgeon: Lin Landsman, MD;  Location[facility] ENDOSCOPY;  Service: Gastroenterology;  Laterality: N/A;  . ESOPHAGOGASTRODUODENOSCOPY (EGD) WITH PROPOFOL N/A 07/08/2017   Procedure: ESOPHAGOGASTRODUODENOSCOPY (EGD) WITH PROPOFOL;  Surgeon: [name], MD;  Location: [facility] ENDOSCOPY;  Service: Endoscopy;  Laterality: N/A;  . [place]  . GIVENS CAPSULE STUDY N/A 07/09/2017   Procedure: GIVENS CAPSULE STUDY;  Surgeon: Jonathon Bellows, MD;  Location: [facility] ENDOSCOPY;  Service: Gastroenterology;  Laterality: N/A;  . KNEE SURGERY     left  . ROTATOR CUFF REPAIR      right  . TONSILLECTOMY  1950   HPI:  Pt is a 72 y.o. female with a known history of recurrent CVA(she had an acute right thalamic infarct in March 2019, Rt MCA April 2019 admitted with drooling, speech deficits.  Pt found to have acute internal capsule CVA on left, old Left MCA CVA, old right thalamic and cerebellar CVAs.  PMH + for Tobacco Heavily per chart, atrial fibrillation on eliquis, severe iron deficiency anemia with local G.I. bleeding as for workup with colonoscopy and endoscopy in February 2019 on iron, vitamin B12 deficiency, COPD, tremors, coronary artery dx, tremors. Pt has been seen by SlP previously for swallow evaluations but not speech/language evals.  She passed an RNSSS here in the hospital.     Assessment / Plan / Recommendation Clinical Impression  Patient presents with oropharyngeal swallow which appears at bedside to be within functional limits with adequate airway protection. No overt signs of aspiration observed despite challenging with consecutive straw sips of thin liquids in excess of 3oz. Mastication is mildly prolonged with regular solid, but functional given dentition. SLP also assessed with mixed consistency (chopped fruit in liquid) given reported difficulties with pills/thin liquids and cereal/milk this morning. Pt tolerated these well. Recommend regular diet with thin liquids, can give meds whole in puree. General aspiration precautions advised (slow rate, small bites and sips, upright position), instructed pt to adhere to these especially with mixed consistencies. No further skilled ST needs identified for dysphagia. Will s/o for swallow, continue to follow  for interventions to improve communication.   SLP Visit Diagnosis: Dysphagia, unspecified (R13.10)    Aspiration Risk  Mild aspiration risk    Diet Recommendation Regular;Thin liquid   Liquid Administration via: Cup;Straw Medication Administration: Whole meds with puree Supervision: Patient able to self  feed Compensations: Slow rate;Small sips/bites;Other (Comment)(thorough mastication, use caution with mixed consistencies) Postural Changes: Seated upright at 90 degrees    Other  Recommendations Oral Care Recommendations: Oral care BID;Patient independent with oral care   Follow up Recommendations Inpatient Rehab(for communication)      Frequency and Duration min 2x/week(for communication)          Prognosis Prognosis for Safe Diet Advancement: Good      Swallow Study   General Date of Onset: 07/30/17 HPI: Pt is a 72 y.o. female with a known history of recurrent CVA(she had an acute right thalamic infarct in March 2019, Rt MCA April 2019 admitted with drooling, speech deficits.  Pt found to have acute internal capsule CVA on left, old Left MCA CVA, old right thalamic and cerebellar CVAs.  PMH + for Tobacco Heavily per chart, atrial fibrillation on eliquis, severe iron deficiency anemia with local G.I. bleeding as for workup with colonoscopy and endoscopy in February 2019 on iron, vitamin B12 deficiency, COPD, tremors, coronary artery dx, tremors. Pt has been seen by SlP previously for swallow evaluations but not speech/language evals.  She passed an RNSSS here in the hospital.   Type of Study: Bedside Swallow Evaluation Previous Swallow Assessment: several prior bedside swallow assessments with findings of oropharyngeal swallow within functional limits Diet Prior to this Study: NPO Temperature Spikes Noted: No Respiratory Status: Room air History of Recent Intubation: No Behavior/Cognition: Alert;Cooperative;Pleasant mood Oral Cavity Assessment: Within Functional Limits Oral Care Completed by SLP: No Oral Cavity - Dentition: Missing dentition(no bottom teeth; top partial) Vision: Functional for self-feeding Self-Feeding Abilities: Able to feed self Patient Positioning: Upright in bed Baseline Vocal Quality: Other (comment)(mildly hoarse) Volitional Cough: Strong Volitional  Swallow: Able to elicit    Oral/Motor/Sensory Function Overall Oral Motor/Sensory Function: Mild impairment(min facial asymmetry; lingual protrusion appears symmetric)   Ice Chips Ice chips: Within functional limits   Thin Liquid Thin Liquid: Within functional limits Presentation: Cup;Straw;Self Fed    Nectar Thick Nectar Thick Liquid: Not tested   Honey Thick Honey Thick Liquid: Not tested   Puree Puree: Within functional limits Presentation: Spoon;Self Fed   Solid   GO   Solid: Within functional limits Presentation: [place], [place], [employer] Speech-Language Pathologist [phone]  Aliene Altes 08/01/2017,2:55 PM

## 2017-08-02 LAB — GLUCOSE, CAPILLARY
GLUCOSE-CAPILLARY: 131 mg/dL — AB (ref 70–99)
Glucose-Capillary: 173 mg/dL — ABNORMAL HIGH (ref 70–99)
Glucose-Capillary: 146 mg/dL — ABNORMAL HIGH (ref 70–99)
Glucose-Capillary: 128 mg/dL — ABNORMAL HIGH (ref 70–99)

## 2017-08-02 LAB — CBC
HCT: 30.1 % — ABNORMAL LOW (ref 36.0–46.0)
HEMOGLOBIN: 8.6 g/dL — AB (ref 12.0–15.0)
MCH: 23.9 pg — ABNORMAL LOW (ref 26.0–34.0)
MCHC: 28.6 g/dL — AB (ref 30.0–36.0)
MCV: 83.6 fL (ref 78.0–100.0)
Platelets: 261 10*3/uL (ref 150–400)
RBC: 3.6 MIL/uL — AB (ref 3.87–5.11)
RDW: 23.2 % — ABNORMAL HIGH (ref 11.5–15.5)
WBC: 5.4 10*3/uL (ref 4.0–10.5)

## 2017-08-02 LAB — BASIC METABOLIC PANEL
Anion gap: 8 (ref 5–15)
BUN: 9 mg/dL (ref 8–23)
CHLORIDE: 109 mmol/L (ref 98–111)
CO2: 30 mmol/L (ref 22–32)
Calcium: 8.8 mg/dL — ABNORMAL LOW (ref 8.9–10.3)
Creatinine, Ser: 0.6 mg/dL (ref 0.44–1.00)
GFR calc Af Amer: >60 mL/min (ref >60)
GFR calc non Af Amer: >60 mL/min (ref >60)
GLUCOSE: 138 mg/dL — AB (ref 70–99)
POTASSIUM: 3.1 mmol/L — AB (ref 3.5–5.1)
Sodium: 147 mmol/L — ABNORMAL HIGH (ref 135–145)

## 2017-08-02 MED ORDER — SENNA 8.6 MG PO TABS
1.0000 | ORAL_TABLET | Freq: Two times a day (BID) | ORAL | Status: DC
  Administered 2017-08-02 – 2017-08-03 (×3): 8.6 mg via ORAL
  Filled 2017-08-02 (×3): qty 1

## 2017-08-02 MED ORDER — SODIUM CHLORIDE 0.45 % IV SOLN
INTRAVENOUS | Status: DC
  Administered 2017-08-02: 1000 mL via INTRAVENOUS

## 2017-08-02 MED ORDER — POLYETHYLENE GLYCOL 3350 17 G PO PACK
17.0000 g | PACK | Freq: Two times a day (BID) | ORAL | Status: DC
  Administered 2017-08-02 (×2): 17 g via ORAL
  Filled 2017-08-02 (×2): qty 1

## 2017-08-02 MED ORDER — POTASSIUM CHLORIDE CRYS ER 20 MEQ PO TBCR
40.0000 meq | EXTENDED_RELEASE_TABLET | Freq: Once | ORAL | Status: AC
  Administered 2017-08-02: 40 meq via ORAL
  Filled 2017-08-02: qty 2

## 2017-08-02 NOTE — Progress Notes (Addendum)
PROGRESS NOTE    Samantha Aguirre  JJO:[id number] DOB: Nov 12, 1945 DOA: 07/30/2017 PCP: Tracie Harrier, MD    Brief Narrative:  Samantha Aguirre is a 72 y.o. female with medical history significant of CVA; HTN; HLD; CAD; DM; COPD; diastolic CHF; and afib on Coumadin presenting with code stroke.  She was recently hospitalized at [facility] for CVA.  She started noticing mouth drooping and creepy feeling and she's had it before.  The mouth droop started this AM.  Some difficulties with speech, word searching and expressive aphasia.  No extremity symptoms. No dysphagia.  +headache, frontal - it is still present.  She thought she was doing pretty well after her last hospitalization.  She was admitted at [facility] in March 2019 for COPD exacerbation and developed an acute stroke while hospitalized.  She returned to [facility] on 6/24 for facial droop and weakness.  She was evaluated for recurrent stroke, which appears to have been negative.  During that hospitalization, she had severe microcytic anemia and was thought to be have bleed.  She returned to the ER for melena on 7/3 and was discharged with stable Hgb.  She returned to the ER on 7/10 for chest pain and LE edema; her exam was not suggestive of volume overload.   ED Course:  H/o CVA, weakness. NIH 4, no tPA.  Recently admitted at [facility] for CVA.  Possible recrudescence.  Neurology has requested admission.     Assessment & Plan:   Principal Problem:   CVA (cerebral vascular accident) ([place]) Active Problems:   Other specified hypothyroidism   Controlled type 2 diabetes mellitus with diabetic neuropathy (HCC)   COPD (chronic obstructive pulmonary disease) (HCC)   Tobacco abuse   HLD (hyperlipidemia)   Seizure disorder (HCC)   Hypertension   History of stroke   Anemia   Acute CVA (cerebrovascular accident) ([place])   PAF (paroxysmal atrial fibrillation) ([place])   1-Acute CVA;  Eliquis  stopped last month due to GI bleed.  Patient had enteroscopy at [place] on 7-01 which showed  non-bleeding jejunal ulcer with no stigmata of bleeding. A single non-bleeding angiodysplastic lesion in the  jejunum. Treated with argon plasma coagulation (APC). An area in the proximal jejunum was tattooed. -Stroke team recommend to contact patient gastroenterologist to see if we can resume eliquis.  -I spoke with Dr Marikay Alar, he recommend a formal GI consult to address question.  [facility] consulted.  -MRI confirm stroke.  PT, OT> recommending CIR.  Discussed with DR Erlinda Hong, he spoke with patient, plan is to do aspirin 325 daily and follow up at [facility], [place] for evaluation of watchman device. I called  patient 's  PCP and left message to Dr Juliane Poot, asking her office to please do referral.  -awaiting insurance approval for CIR>  -had choking episode today twice while eating breakfast . Evaluated by speech, started on heart healthy diet.  -monitor hb.   2-Anemia;  History of GI bleed.  Hb range 10---9./ Hb on 7-18 was at 9.7 Hb has decrease to 8.6. No evidence of active bleeding.  No BM in the hospital.  Monitor for sign of GI bleed.  Repeat hb in am.   HTN; permissive HTN  3-HLD;  6/25 lipids: 123/34/56/164 -Continue Lipitor 80 mg daily   DM;  Hold Glucophage.  SSI  COPD; continue with ellipta  Hypothyroidism;  Continue with synthroid.   Tobacco dependence -Encourage cessation.  This was discussed with the patient and should be reviewed on an ongoing  basis.   -Patch declined  Seizure d/o -Continue Keppra  Constipation; start miralax.   Hypernatremia; start half NS>   Hypokalemia; replaced orally.   DVT prophylaxis: SCD Code Status: DNR Family Communication: no family at bedside.  Disposition Plan: needs rehab.   Consultants:   GI  Neurology      Procedures:    Antimicrobials: none  Subjective: She feels well, denies BM, no blood per rectum    Objective: Vitals:   08/01/17 2333  08/02/17 0352 08/02/17 0834 08/02/17 0911  BP: 128/63 (!) 150/69 (!) 146/76   Pulse: (!) 50 (!) 52 (!) 57   Resp: 18 18 15    Temp: 98.1 F (36.7 C) 97.8 F (36.6 C) 98.3 F (36.8 C)   TempSrc: Oral Oral    SpO2: 97% 97% 100% 97%  Weight:      Height:        Intake/Output Summary (Last 24 hours) at 08/02/2017 1123 Last data filed at 08/02/2017 1028 Gross per 24 hour  Intake 560 ml  Output -  Net 560 ml   Filed Weights   07/30/17 1000 07/30/17 1048  Weight: 83.3 kg (183 lb 10.3 oz) 83.3 kg (183 lb 10.3 oz)    Examination:  General exam: NAD Respiratory system: CTA Cardiovascular system: S 1, S 2 RRR Gastrointestinal system: BS present, soft, nt Central nervous system; alert, oriented, speech clear Extremities: no edema Skin: No rashes, lesions or ulcers    Data Reviewed: I have personally reviewed following labs and imaging studies  CBC: Recent Labs  Lab 07/30/17 1007 07/30/17 1013 07/31/17 0810 08/02/17 0300  WBC 6.7  --  6.8 5.4  NEUTROABS 4.1  --   --   --   HGB 9.7* 10.2* 10.3* 8.6*  HCT 34.4* 30.0* 35.6* 30.1*  MCV 82.7  --  81.3 83.6  PLT 294  --  271 269   Basic Metabolic Panel: Recent Labs  Lab 07/30/17 1007 07/30/17 1013 07/31/17 0810 08/02/17 0300  NA 141 141 143 147*  K 2.9* 2.7* 3.9 3.1*  CL 107 104 109 109  CO2 25  --  23 30  GLUCOSE 205* 199* 133* 138*  BUN 6* 4* 10 9  CREATININE 0.66 0.50 0.52 0.60  CALCIUM 8.4*  --  8.7* 8.8*   GFR: Estimated Creatinine Clearance: 61.3 mL/min (by C-G formula based on SCr of 0.6 mg/dL). Liver Function Tests: Recent Labs  Lab 07/30/17 1007  AST 22  ALT 10  ALKPHOS 110  BILITOT 0.5  PROT 6.2*  ALBUMIN 3.4*   No results for input(s): LIPASE, AMYLASE in the last 168 hours. No results for input(s): AMMONIA in the last 168 hours. Coagulation Profile: Recent Labs  Lab 07/30/17 1007  INR 1.07   Cardiac Enzymes: No results for input(s): CKTOTAL, CKMB, CKMBINDEX, TROPONINI in the last 168  hours. BNP (last 3 results) No results for input(s): PROBNP in the last 8760 hours. HbA1C: Recent Labs    07/31/17 0342  HGBA1C 5.6   CBG: Recent Labs  Lab 08/01/17 0840 08/01/17 0841 08/01/17 1227 08/01/17 2123 08/02/17 0641  GLUCAP 183* 184* 126* 133* 131*   Lipid Profile: Recent Labs    07/31/17 0342  CHOL 113  HDL 33*  LDLCALC 44  TRIG 179*  CHOLHDL 3.4   Thyroid Function Tests: No results for input(s): TSH, T4TOTAL, FREET4, T3FREE, THYROIDAB in the last 72 hours. Anemia Panel: No results for input(s): VITAMINB12, FOLATE, FERRITIN, TIBC, IRON, RETICCTPCT in the last 72 hours.  Sepsis Labs: No results for input(s): PROCALCITON, LATICACIDVEN in the last 168 hours.  No results found for this or any previous visit (from the past 240 hour(s)).       Radiology Studies: No results found.      Scheduled Meds: . aspirin  325 mg Oral Daily  . atorvastatin  80 mg Oral q1800  . DULoxetine  60 mg Oral QHS  . feeding supplement (GLUCERNA SHAKE)  237 mL Oral BID BM  . fluticasone  1 spray Each Nare Daily  . gabapentin  800 mg Oral BID  . insulin aspart  0-15 Units Subcutaneous TID WC  . levETIRAcetam  500 mg Oral BID  . levothyroxine  112 mcg Oral QAC breakfast  . pantoprazole  40 mg Oral BID  . polyethylene glycol  17 g Oral BID  . senna  1 tablet Oral BID  . sucralfate  1 g Oral TID WC & HS  . umeclidinium bromide  1 puff Inhalation Daily   Continuous Infusions: . sodium chloride 1,000 mL (08/02/17 0920)     LOS: 3 days    Time spent: 35 minutes.     Elmarie Shiley, MD [facility] Pager [phone]  If 7PM-7AM, please contact night-coverage [email or web address] Password [facility] 08/02/2017, 11:23 AM

## 2017-08-03 LAB — CBC
HEMATOCRIT: 32.4 % — AB (ref 36.0–46.0)
HEMOGLOBIN: 9.3 g/dL — AB (ref 12.0–15.0)
MCH: 23.9 pg — ABNORMAL LOW (ref 26.0–34.0)
MCHC: 28.7 g/dL — AB (ref 30.0–36.0)
MCV: 83.3 fL (ref 78.0–100.0)
Platelets: 267 10*3/uL (ref 150–400)
RBC: 3.89 MIL/uL (ref 3.87–5.11)
RDW: 23.8 % — AB (ref 11.5–15.5)
WBC: 5.5 10*3/uL (ref 4.0–10.5)

## 2017-08-03 LAB — GLUCOSE, CAPILLARY
GLUCOSE-CAPILLARY: 199 mg/dL — AB (ref 70–99)
Glucose-Capillary: 132 mg/dL — ABNORMAL HIGH (ref 70–99)

## 2017-08-03 LAB — BASIC METABOLIC PANEL
ANION GAP: 8 (ref 5–15)
BUN: 9 mg/dL (ref 8–23)
CHLORIDE: 109 mmol/L (ref 98–111)
CO2: 28 mmol/L (ref 22–32)
Calcium: 8.8 mg/dL — ABNORMAL LOW (ref 8.9–10.3)
Creatinine, Ser: 0.61 mg/dL (ref 0.44–1.00)
GFR calc non Af Amer: >60 mL/min (ref >60)
Glucose, Bld: 142 mg/dL — ABNORMAL HIGH (ref 70–99)
POTASSIUM: 3.5 mmol/L (ref 3.5–5.1)
Sodium: 145 mmol/L (ref 135–145)

## 2017-08-03 MED ORDER — ASPIRIN 325 MG PO TABS: 325.0000 mg | ORAL_TABLET | Freq: Every day | ORAL | 0 refills | Status: DC

## 2017-08-03 MED ORDER — BLOOD GLUCOSE METER KIT: PACK | 0 refills | Status: DC

## 2017-08-03 MED ORDER — GLUCERNA SHAKE PO LIQD: 237.0000 mL | Freq: Two times a day (BID) | ORAL | 0 refills | Status: DC

## 2017-08-03 MED ORDER — PANTOPRAZOLE SODIUM 40 MG PO TBEC: 40.0000 mg | DELAYED_RELEASE_TABLET | Freq: Two times a day (BID) | ORAL | 0 refills | Status: DC

## 2017-08-03 NOTE — Care Management Note (Addendum)
Case Management Note  Patient Details  Name: Samantha Aguirre MRN: [id number] Date of Birth: 11-01-1945  Subjective/Objective:                    Action/Plan: Pt is discharging home with resumption of [place] services. CM met with the patient and she was active with [facility] prior to admission. CM notified [facility] of resumption orders and d/c today. Pt asking for assistance with transportation home. She states she doesn't have anyone to provide transportation. She is requesting [facility] transport. CM will arrange when pt and bedside RN are ready. Pt requesting prescription for CBG machine at home. MD updated. CM also provided her with information on the [place] through [facility].   Expected Discharge Date:  08/03/17               Expected Discharge Plan:  [place]  In-House Referral:     Discharge planning Services  CM Consult  Post Acute Care Choice:  Home Health Choice offered to:  Patient  DME Arranged:    DME Agency:     HH Arranged:  RN, PT, OT, Speech Therapy HH Agency:  [facility]  Status of Service:  Completed, signed off  If discussed at [place] of Stay Meetings, dates discussed:    Additional Comments:  Pollie Friar, RN 08/03/2017, 11:36 AM

## 2017-08-03 NOTE — Progress Notes (Signed)
Inpatient Rehabilitation-Admissions Coordinator   Met with pt at the bedside as follow up from PM&R consult. Since Friday, pt has progressed to [employer] 200 feet, with pt no longer candidate for CIR. AC has communicated this to pt who reports she feels comfortable returning home at current level with [facility] therapy. AC will sign off. Please call if questions.   Jhonnie Garner, OTR/L  Rehab Admissions Coordinator  [phone] 08/03/2017 12:20 PM

## 2017-08-03 NOTE — Progress Notes (Signed)
CSW noting that patient recommendations have been changed to HHPT. No SNF placement needed.  CSW signing off.  Laveda Abbe, [place] Clinical Social Worker [phone]

## 2017-08-03 NOTE — Discharge Summary (Signed)
Physician Discharge Summary  Samantha Aguirre NMM:[id number] DOB: Sep 04, 1945 DOA: 07/30/2017  PCP: Tracie Harrier, MD  Admit date: 07/30/2017 Discharge date: 08/03/2017  Admitted From: Home  Disposition:  Home   Recommendations for Outpatient Follow-up:  1. Follow up with PCP in 1-2 weeks 2. Please obtain BMP/CBC in one week 3. Needs referral to [facility] for watchman procedure evaluation.   Home Health: yes  Discharge Condition; stable.  CODE STATUS: DNR Diet recommendation: Heart Healthy   Brief/Interim Summary: Brief Narrative: [name] a 72 y.o.femalewith medical history significant ofCVA; HTN; HLD; CAD; DM; COPD; diastolic CHF; and afibon Coumadinpresenting with code stroke. She was recently hospitalized at [facility] for CVA. She started noticing mouth drooping and creepy feeling and she's had it before. The mouth droop started this AM. Some difficulties with speech, word searching and expressive aphasia. No extremity symptoms. No dysphagia. +headache, frontal - it is still present. She thought she was doing pretty well after her last hospitalization.  She was admitted at [facility] in March 2019 for COPD exacerbation and developed an acute stroke while hospitalized. She returned to [facility] on 6/24 for facial droop and weakness. She was evaluated for recurrent stroke, which appears to have been negative. During that hospitalization, she had severe microcytic anemia and was thought to be have bleed. She returned to the ER for melena on 7/3 and was discharged with stable Hgb. She returned to the ER on 7/10 for chest pain and LE edema; her exam was not suggestive of volume overload.   ED Course:H/o CVA, weakness. NIH 4, no tPA. Recently admitted at [facility] for CVA. Possible recrudescence. Neurology has requested admission.     Assessment & Plan:   Principal Problem:   CVA (cerebral vascular accident) ([place]) Active Problems:   Other  specified hypothyroidism   Controlled type 2 diabetes mellitus with diabetic neuropathy (HCC)   COPD (chronic obstructive pulmonary disease) (HCC)   Tobacco abuse   HLD (hyperlipidemia)   Seizure disorder (HCC)   Hypertension   History of stroke   Anemia   Acute CVA (cerebrovascular accident) ([place])   PAF (paroxysmal atrial fibrillation) ([place])   1-Acute CVA;  Eliquis  stopped last month due to GI bleed. Patient had enteroscopy at [facility] on 7-01 which showed  non-bleeding jejunal ulcer with no stigmata of bleeding. A single non-bleeding angiodysplastic lesion in the  jejunum. Treated with argon plasma coagulation (APC). An area in the proximal jejunum was tattooed. -Stroke team recommend to contact patient gastroenterologist to see if we can resume eliquis.  -I spoke with Dr Marikay Alar, he recommend a formal GI consult to address question.  [facility] consulted.  -MRI confirm stroke.  PT, OT> recommending now [facility] PT  Discussed with DR Erlinda Hong, he spoke with patient, plan is to do aspirin 325 daily and follow up at [facility] [place] for evaluation of watchman device. I called  patient 's  PCP and left message to Dr Juliane Poot, asking her office to please do referral.  -had choking episode today twice while eating breakfast . Evaluated by speech, started on heart healthy diet.   stable for discharge   2-Anemia;  History of GI bleed.  Hb range 10---9./ Hb on 7-18 was at 9.7 Hb has decrease to 8.6. No evidence of active bleeding.  No BM in the hospital.  Monitor for sign of GI bleed.  Hb stable at 9.3. No evidence of active bleeding.   HTN; permissive HTN  3-HLD;  6/25 lipids: 123/34/56/164 -Continue Lipitor  80 mg daily   DM;  Resume at discharge  Glucophage.  SSI  COPD; continue with ellipta  Hypothyroidism;  Continue with synthroid.   Tobacco dependence -Encourage cessation. This was discussed with the patient and should be reviewed on an ongoing basis.   -Patchdeclined  Seizure d/o -Continue Keppra  Constipation; on  miralax.   Hypernatremia; start half NS> resolved  Hypokalemia; replaced orally.    Discharge Diagnoses:  Principal Problem:   CVA (cerebral vascular accident) ([place]) Active Problems:   Other specified hypothyroidism   Controlled type 2 diabetes mellitus with diabetic neuropathy (HCC)   COPD (chronic obstructive pulmonary disease) (HCC)   Tobacco abuse   HLD (hyperlipidemia)   Seizure disorder (HCC)   Hypertension   History of stroke   Anemia   Acute CVA (cerebrovascular accident) ([place])   PAF (paroxysmal atrial fibrillation) [facility])    Discharge Instructions  Discharge Instructions    Ambulatory referral to Neurology   Complete by:  As directed    Follow up with Dr. Leonie Man at [facility] in 4-6 weeks. Too complicated for RN to follow. Thanks.   Diet - low sodium heart healthy   Complete by:  As directed    Increase activity slowly   Complete by:  As directed      Allergies as of 08/03/2017      Reactions   Methotrexate Rash   Mushroom Extract Complex Anaphylaxis   Made patient "deathly sick"   Atenolol    "MD took me off of it bc it was doing something wrong" Made patient HYPOTENSIVE   Ivp Dye [iodinated Diagnostic Agents] Itching   Pt injected with IV contrast.  5 min after injection pt complained of itching behind ear and on her abd.   Morphine And Related Other (See Comments)   "stops my breathing" NEAR-RESPIRATORY ARREST   Oxycodone Hcl    Percocet [oxycodone-acetaminophen] Nausea And Vomiting, Other (See Comments)   Stomach pains   Percodan [oxycodone-aspirin] Nausea And Vomiting, Other (See Comments)   Stomach pains   Verapamil Other (See Comments)   " MD told me this was screwing me up" MAKES PATIENT HYPOTENSIVE      Medication List    STOP taking these medications   aspirin EC 81 MG tablet Replaced by:  aspirin 325 MG tablet   ELIQUIS 5 MG Tabs tablet Generic drug:  apixaban    omeprazole 20 MG capsule Commonly known as:  PRILOSEC Replaced by:  pantoprazole 40 MG tablet   SUMAtriptan 25 MG tablet Commonly known as:  IMITREX     TAKE these medications   albuterol 108 (90 Base) MCG/ACT inhaler Commonly known as:  PROVENTIL HFA;VENTOLIN HFA Inhale 2 puffs into the lungs every 6 (six) hours as needed for wheezing or shortness of breath.   aspirin 325 MG tablet Take 1 tablet (325 mg total) by mouth daily. Replaces:  aspirin EC 81 MG tablet   atorvastatin 80 MG tablet Commonly known as:  LIPITOR Take 1 tablet (80 mg total) by mouth daily at 6 PM.   diclofenac sodium 1 % Gel Commonly known as:  VOLTAREN Apply 2 g topically 4 (four) times daily. What changed:    when to take this  reasons to take this   DULoxetine 60 MG capsule Commonly known as:  CYMBALTA Take 1 capsule (60 mg total) by mouth at bedtime.   feeding supplement (GLUCERNA SHAKE) Liqd Take 237 mLs by mouth 2 (two) times daily between meals.   ferrous sulfate 325 (  65 FE) MG EC tablet Take 1 tablet (325 mg total) by mouth 2 (two) times daily with a meal.   fluticasone 50 MCG/ACT nasal spray Commonly known as:  FLONASE Place 1 spray into both nostrils daily.   furosemide 20 MG tablet Commonly known as:  LASIX Take 20 mg by mouth daily.   gabapentin 800 MG tablet Commonly known as:  NEURONTIN Take 1 tablet (800 mg total) by mouth 2 (two) times daily.   glipiZIDE-metformin 5-500 MG tablet Commonly known as:  METAGLIP Take 1 tablet by mouth 2 (two) times daily before a meal.   levETIRAcetam 500 MG tablet Commonly known as:  KEPPRA Take 1 tablet (500 mg total) by mouth 2 (two) times daily.   levothyroxine 175 MCG tablet Commonly known as:  SYNTHROID, LEVOTHROID Take 1 tablet (175 mcg total) by mouth daily before breakfast. What changed:  how much to take   lisinopril 20 MG tablet Commonly known as:  PRINIVIL,ZESTRIL Take 20 mg by mouth daily.   nitroGLYCERIN 0.4 MG SL  tablet Commonly known as:  NITROSTAT Place 1 tablet (0.4 mg total) under the tongue every 5 (five) minutes as needed for chest pain.   pantoprazole 40 MG tablet Commonly known as:  PROTONIX Take 1 tablet (40 mg total) by mouth 2 (two) times daily. Replaces:  omeprazole 20 MG capsule   potassium chloride 10 MEQ CR capsule Commonly known as:  MICRO-K Take 1 capsule (10 mEq total) by mouth daily. What changed:    how much to take  when to take this   sucralfate 1 g tablet Commonly known as:  CARAFATE TAKE (1) TABLET BY MOUTH FOUR TIMES A DAY   umeclidinium bromide 62.5 MCG/INH Aepb Commonly known as:  INCRUSE ELLIPTA Inhale 1 puff into the lungs daily.   vitamin B-12 1000 MCG tablet Commonly known as:  CYANOCOBALAMIN Take 1 tablet by mouth daily.      Follow-up Information    Garvin Fila, MD. Schedule an appointment as soon as possible for a visit in 6 week(s).   Specialties:  Neurology, Radiology Contact information: [address] [place] [address] [phone]          Allergies  Allergen Reactions  . Methotrexate Rash  . Mushroom Extract Complex Anaphylaxis    Made patient "deathly sick"  . Atenolol     "MD took me off of it bc it was doing something wrong" Made patient HYPOTENSIVE  . Ivp Dye [Iodinated Diagnostic Agents] Itching    Pt injected with IV contrast.  5 min after injection pt complained of itching behind ear and on her abd.  . Morphine And Related Other (See Comments)    "stops my breathing" NEAR-RESPIRATORY ARREST  . Oxycodone Hcl   . Percocet [Oxycodone-Acetaminophen] Nausea And Vomiting and Other (See Comments)    Stomach pains  . Percodan [Oxycodone-Aspirin] Nausea And Vomiting and Other (See Comments)    Stomach pains  . Verapamil Other (See Comments)    " MD told me this was screwing me up" MAKES PATIENT HYPOTENSIVE    Consultations:  Neurology    Procedures/Studies: Ct Abdomen Pelvis Wo Contrast  Result  Date: 07/15/2017 CLINICAL DATA:  Gastrointestinal bleeding. Abdominal pain. Recent endoscopy and colonoscopy. EXAM: CT ABDOMEN AND PELVIS WITHOUT CONTRAST TECHNIQUE: Multidetector CT imaging of the abdomen and pelvis was performed following the standard protocol without IV contrast. COMPARISON:  12/12/2013 FINDINGS: Lower chest: Normal Hepatobiliary: Liver appears normal without contrast. Previous cholecystectomy. Pancreas: Normal  Spleen: Normal Adrenals/Urinary Tract: Adrenal glands are normal. 1 mm nonobstructing stone in the lower pole of the right kidney. Left kidney is normal. Bladder is normal. Stomach/Bowel: Diverticulosis of the left colon without evidence of diverticulitis. No definable mass. No definable inflammatory change. No sign of obstruction. Small duodenal lipoma of no significance as previously seen. Vascular/Lymphatic: Aortic atherosclerosis. No aneurysm. IVC is normal. No retroperitoneal adenopathy. Reproductive: Previous hysterectomy.  No pelvic mass. Other: No free fluid or air. Musculoskeletal: Old superior endplate fracture at L1 with loss of height of 30%. Lower lumbar degenerative disc disease and degenerative facet disease. IMPRESSION: No acute finding by CT. No sign of bowel obstruction, bowel mass or bowel inflammation. Ordinary uncomplicated diverticulosis of the left colon. Aortic atherosclerosis. Previous cholecystectomy and hysterectomy. Old L1 fracture.  Lower lumbar degenerative changes. Electronically Signed   By: Nelson Chimes M.D.   On: 07/15/2017 17:20   Dg Chest 2 View  Result Date: 07/22/2017 CLINICAL DATA:  Chest pain beginning this morning. EXAM: CHEST - 2 VIEW COMPARISON:  Single-view of the chest 04/03/2017. PA and lateral chest 02/27/2016. FINDINGS: A punctate calcified granuloma left upper lobe is again seen. Lungs otherwise clear. Heart size normal. No pneumothorax or pleural effusion. Aortic atherosclerosis noted. No acute or focal bony abnormality. IMPRESSION: No  acute disease. Atherosclerosis. Electronically Signed   By: Inge Rise M.D.   On: 07/22/2017 13:23   Ct Head Wo Contrast  Result Date: 07/06/2017 CLINICAL DATA:  Slight facial droop and weakness since last night at 9 p.m. EXAM: CT HEAD WITHOUT CONTRAST TECHNIQUE: Contiguous axial images were obtained from the base of the skull through the vertex without intravenous contrast. COMPARISON:  04/05/2017 FINDINGS: Brain: No evidence of acute infarction, hemorrhage, hydrocephalus, extra-axial collection or mass lesion/mass effect. Moderate remote left MCA territory infarct centered in the left parietal and posterior temporal lobes. Patchy low-density in the cerebral white matter from chronic small vessel ischemia. Small remote bilateral cerebellar infarcts. Vascular: No hyperdense vessel. Skull: No acute finding Sinuses/Orbits: Negative Other: These results were called by telephone at the time of interpretation on 07/06/2017 at 9:56 am to Dr. Conni Slipper , who verbally acknowledged these results. IMPRESSION: 1. No acute finding. 2. Chronic ischemic injury is described above. Electronically Signed   By: Monte Fantasia M.D.   On: 07/06/2017 09:58   [name] Head Wo Contrast  Result Date: 07/30/2017 CLINICAL DATA:  Acute onset LEFT-sided weakness during physical therapy. Similar symptoms a few weeks ago due to thalamic infarct. Assess for stroke. History of hypertension, hyperlipidemia, seizures, skin cancer, carotid endarterectomy. EXAM: MRI HEAD WITHOUT CONTRAST MRA HEAD WITHOUT CONTRAST TECHNIQUE: Multiplanar, multiecho pulse sequences of the brain and surrounding structures were obtained without intravenous contrast. Angiographic images of the head were obtained using MRA technique without contrast. COMPARISON:  MRI head July 06, 2017 and MRA head April 06, 2017. FINDINGS: MRI HEAD FINDINGS INTRACRANIAL CONTENTS: 5 mm reduced diffusion with low ADC values LEFT posterior limb of the internal capsule. Patchy T2  shine through LEFT temporoparietal lobes with encephalomalacia and associated unchanged susceptibility artifact. No susceptibility artifact to suggest interval hemorrhage. Old bilateral small cerebellar infarcts. Moderate general parenchymal brain volume loss without hydrocephalus. Patchy to confluent supratentorial pontine white matter FLAIR T2 hyperintensities. No midline shift, mass effect. No abnormal extra-axial fluid collections. 1 cm RIGHT convexity low signal meningioma without mass effect. VASCULAR: Normal major intracranial vascular flow voids present at skull base. SKULL AND UPPER CERVICAL SPINE: No abnormal sellar expansion. No  suspicious calvarial bone marrow signal. Craniocervical junction maintained. SINUSES/ORBITS: The mastoid air-cells and included paranasal sinuses are well-aerated.The included ocular globes and orbital contents are non-suspicious. OTHER: None. MRA HEAD FINDINGS ANTERIOR CIRCULATION: Normal flow related enhancement of the included cervical, petrous, cavernous and supraclinoid internal carotid arteries. 2 mm laterally directed outpouching LEFT cavernous ICA. Patent anterior communicating artery. Patent anterior and middle cerebral arteries. Less conspicuous luminal irregularity of the middle cerebral arteries, prior stenosis attributable to motion artifact. No large vessel occlusion, flow limiting stenosis. POSTERIOR CIRCULATION: RIGHT vertebral artery is dominant. Vertebrobasilar arteries are patent. RIGHT posterior inferior cerebellar artery not visualized though this may be technical. Patent posterior cerebral arteries. Bilateral posterior communicating arteries present. Fetal origin LEFT posterior cerebral artery. No large vessel occlusion, flow limiting stenosis,  aneurysm. ANATOMIC VARIANTS: None. Source images and MIP images were reviewed. IMPRESSION: MRI HEAD: 1. Acute 5 mm LEFT internal capsule infarct. 2. Old LEFT MCA territory infarct. Old RIGHT thalamus lacunar infarct.  Multiple old small cerebellar infarcts. 3. Stable moderate parenchymal brain volume loss and moderate chronic small vessel ischemic changes. MRA HEAD: 1. No emergent large vessel occlusion or flow-limiting stenosis. 2. Previously reported stenosis not apparent today; prior findings attributable to motion artifact. 3. 2 mm LEFT cavernous aneurysm versus infundibulum. Electronically Signed   By: Elon Alas M.D.   On: 07/30/2017 14:26   [name]  Result Date: 07/30/2017 CLINICAL DATA:  Acute onset LEFT-sided weakness during physical therapy. Similar symptoms a few weeks ago due to thalamic infarct. Assess for stroke. History of hypertension, hyperlipidemia, seizures, skin cancer, carotid endarterectomy. EXAM: MRI HEAD WITHOUT CONTRAST MRA HEAD WITHOUT CONTRAST TECHNIQUE: Multiplanar, multiecho pulse sequences of the brain and surrounding structures were obtained without intravenous contrast. Angiographic images of the head were obtained using MRA technique without contrast. COMPARISON:  MRI head July 06, 2017 and MRA head April 06, 2017. FINDINGS: MRI HEAD FINDINGS INTRACRANIAL CONTENTS: 5 mm reduced diffusion with low ADC values LEFT posterior limb of the internal capsule. Patchy T2 shine through LEFT temporoparietal lobes with encephalomalacia and associated unchanged susceptibility artifact. No susceptibility artifact to suggest interval hemorrhage. Old bilateral small cerebellar infarcts. Moderate general parenchymal brain volume loss without hydrocephalus. Patchy to confluent supratentorial pontine white matter FLAIR T2 hyperintensities. No midline shift, mass effect. No abnormal extra-axial fluid collections. 1 cm RIGHT convexity low signal meningioma without mass effect. VASCULAR: Normal major intracranial vascular flow voids present at skull base. SKULL AND UPPER CERVICAL SPINE: No abnormal sellar expansion. No suspicious calvarial bone marrow signal. Craniocervical junction maintained.  SINUSES/ORBITS: The mastoid air-cells and included paranasal sinuses are well-aerated.The included ocular globes and orbital contents are non-suspicious. OTHER: None. MRA HEAD FINDINGS ANTERIOR CIRCULATION: Normal flow related enhancement of the included cervical, petrous, cavernous and supraclinoid internal carotid arteries. 2 mm laterally directed outpouching LEFT cavernous ICA. Patent anterior communicating artery. Patent anterior and middle cerebral arteries. Less conspicuous luminal irregularity of the middle cerebral arteries, prior stenosis attributable to motion artifact. No large vessel occlusion, flow limiting stenosis. POSTERIOR CIRCULATION: RIGHT vertebral artery is dominant. Vertebrobasilar arteries are patent. RIGHT posterior inferior cerebellar artery not visualized though this may be technical. Patent posterior cerebral arteries. Bilateral posterior communicating arteries present. Fetal origin LEFT posterior cerebral artery. No large vessel occlusion, flow limiting stenosis,  aneurysm. ANATOMIC VARIANTS: None. Source images and MIP images were reviewed. IMPRESSION: MRI HEAD: 1. Acute 5 mm LEFT internal capsule infarct. 2. Old LEFT MCA territory infarct. Old RIGHT thalamus lacunar infarct. Multiple  old small cerebellar infarcts. 3. Stable moderate parenchymal brain volume loss and moderate chronic small vessel ischemic changes. MRA HEAD: 1. No emergent large vessel occlusion or flow-limiting stenosis. 2. Previously reported stenosis not apparent today; prior findings attributable to motion artifact. 3. 2 mm LEFT cavernous aneurysm versus infundibulum. Electronically Signed   By: Elon Alas M.D.   On: 07/30/2017 14:26   [name]  Result Date: 07/06/2017 CLINICAL DATA:  Focal neuro deficit. History of stroke, atrial fibrillation. On Eliquis EXAM: MRI HEAD WITHOUT CONTRAST TECHNIQUE: Multiplanar, multiecho pulse sequences of the brain and surrounding structures were obtained  without intravenous contrast. COMPARISON:  MRI head 04/05/2017 FINDINGS: Brain: Left temporoparietal infarct with volume loss and mild cortical hemorrhage is noted on the prior study. Moderate chronic microvascular ischemic change throughout the white matter. Small chronic infarcts in the cerebellum bilaterally. Negative for acute infarct. 1 cm calcified extra-axial mass over the right convexity compatible with a small meningioma unchanged from prior studies. No adjacent edema in the brain. Mild to moderate atrophy unchanged. Vascular: Normal arterial flow void Skull and upper cervical spine: Negative Sinuses/Orbits: Mild mucosal edema paranasal sinuses. Normal orbit bilaterally Other: None IMPRESSION: Negative for acute infarct Atrophy with moderate to advanced chronic ischemic changes. 1 cm right convexity meningioma unchanged from prior studies. Electronically Signed   By: Franchot Gallo M.D.   On: 07/06/2017 12:56   Ct Head Code Stroke Wo Contrast  Result Date: 07/30/2017 CLINICAL DATA:  Code stroke. 72 year old female with left facial droop and loss of extremity sensation. Prior left MCA/PCA watershed infarct. EXAM: CT HEAD WITHOUT CONTRAST TECHNIQUE: Contiguous axial images were obtained from the base of the skull through the vertex without intravenous contrast. COMPARISON:  Brain MRI and head CT 07/06/2017 and earlier. FINDINGS: Brain: Chronic posterior left hemisphere encephalomalacia is stable. Small chronic cerebellar infarcts redemonstrated and stable. Patchy additional bilateral cerebral white matter hypodensity with involvement of the anterior deep white matter capsules appears stable. No midline shift, ventriculomegaly, mass effect, evidence of mass lesion, intracranial hemorrhage or evidence of cortically based acute infarction. Vascular: Calcified atherosclerosis at the skull base. No suspicious intracranial vascular hyperdensity. Skull: Stable, negative. Sinuses/Orbits: Visualized paranasal  sinuses and mastoids are stable and well pneumatized. Other: No acute orbit or scalp soft tissue findings. ASPECTS [facility] Stroke Program Early CT Score) - Ganglionic level infarction (caudate, lentiform nuclei, internal capsule, insula, M1-M3 cortex): 7 - Supraganglionic infarction (M4-M6 cortex): 3 Total score (0-10 with 10 being normal): 10 IMPRESSION: 1. No acute intracranial abnormality. Chronic ischemic disease appears stable by CT since 07/06/2017. 2. ASPECTS is 10. 3. These results were communicated to Dr. Rory Percy at 10:31 amon 7/18/2019by text page via the [facility] messaging system. Electronically Signed   By: Genevie Ann M.D.   On: 07/30/2017 10:31       Subjective: Feeling well, wants to go home.  No blood in the stool   Discharge Exam: Vitals:   08/03/17 0753 08/03/17 0943  BP: (!) 154/56   Pulse: (!) 58 67  Resp: 18 18  Temp:    SpO2: 99% 96%   Vitals:   08/03/17 0019 08/03/17 0403 08/03/17 0753 08/03/17 0943  BP: (!) 148/67 (!) 159/76 (!) 154/56   Pulse: (!) 55 60 (!) 58 67  Resp: 18 18 18 18   Temp: 97.7 F (36.5 C) 97.8 F (36.6 C)    TempSrc: Oral Oral    SpO2: 98% 96% 99% 96%  Weight:      Height:  General: Pt is alert, awake, not in acute distress Cardiovascular: RRR, S1/S2 +, no rubs, no gallops Respiratory: CTA bilaterally, no wheezing, no rhonchi Abdominal: Soft, NT, ND, bowel sounds + Extremities: no edema, no cyanosis    The results of significant diagnostics from this hospitalization (including imaging, microbiology, ancillary and laboratory) are listed below for reference.     Microbiology: No results found for this or any previous visit (from the past 240 hour(s)).   Labs: BNP (last 3 results) Recent Labs    10/03/16 1436 03/27/17 1244 07/22/17 1226  BNP 36.0 133.0* 30.0   Basic Metabolic Panel: Recent Labs  Lab 07/30/17 1007 07/30/17 1013 07/31/17 0810 08/02/17 0300 08/03/17 0420  NA 141 141 143 147* 145  K 2.9* 2.7* 3.9 3.1*  3.5  CL 107 104 109 109 109  CO2 25  --  23 30 28   GLUCOSE 205* 199* 133* 138* 142*  BUN 6* 4* 10 9 9   CREATININE 0.66 0.50 0.52 0.60 0.61  CALCIUM 8.4*  --  8.7* 8.8* 8.8*   Liver Function Tests: Recent Labs  Lab 07/30/17 1007  AST 22  ALT 10  ALKPHOS 110  BILITOT 0.5  PROT 6.2*  ALBUMIN 3.4*   No results for input(s): LIPASE, AMYLASE in the last 168 hours. No results for input(s): AMMONIA in the last 168 hours. CBC: Recent Labs  Lab 07/30/17 1007 07/30/17 1013 07/31/17 0810 08/02/17 0300 08/03/17 0420  WBC 6.7  --  6.8 5.4 5.5  NEUTROABS 4.1  --   --   --   --   HGB 9.7* 10.2* 10.3* 8.6* 9.3*  HCT 34.4* 30.0* 35.6* 30.1* 32.4*  MCV 82.7  --  81.3 83.6 83.3  PLT 294  --  [phone]   Cardiac Enzymes: No results for input(s): CKTOTAL, CKMB, CKMBINDEX, TROPONINI in the last 168 hours. BNP: Invalid input(s): POCBNP CBG: Recent Labs  Lab 08/02/17 0641 08/02/17 1137 08/02/17 1618 08/02/17 2135 08/03/17 0640  GLUCAP 131* 173* 146* 128* 132*   D-Dimer No results for input(s): DDIMER in the last 72 hours. Hgb A1c No results for input(s): HGBA1C in the last 72 hours. Lipid Profile No results for input(s): CHOL, HDL, LDLCALC, TRIG, CHOLHDL, LDLDIRECT in the last 72 hours. Thyroid function studies No results for input(s): TSH, T4TOTAL, T3FREE, THYROIDAB in the last 72 hours.  Invalid input(s): FREET3 Anemia work up No results for input(s): VITAMINB12, FOLATE, FERRITIN, TIBC, IRON, RETICCTPCT in the last 72 hours. Urinalysis    Component Value Date/Time   COLORURINE YELLOW 07/30/2017 1608   APPEARANCEUR CLEAR 07/30/2017 1608   LABSPEC 1.006 07/30/2017 1608   PHURINE 6.0 07/30/2017 1608   GLUCOSEU NEGATIVE 07/30/2017 1608   HGBUR NEGATIVE 07/30/2017 1608   BILIRUBINUR NEGATIVE 07/30/2017 1608   KETONESUR NEGATIVE 07/30/2017 1608   PROTEINUR NEGATIVE 07/30/2017 1608   NITRITE NEGATIVE 07/30/2017 1608   LEUKOCYTESUR NEGATIVE 07/30/2017 1608   Sepsis  Labs Invalid input(s): PROCALCITONIN,  WBC,  LACTICIDVEN Microbiology No results found for this or any previous visit (from the past 240 hour(s)).   Time coordinating discharge: 35 minutes.   SIGNED:   Elmarie Shiley, MD  [facility] 08/03/2017, 10:44 AM Pager   If 7PM-7AM, please contact night-coverage [email or web address] Password [email or web address]

## 2017-08-03 NOTE — Consult Note (Signed)
   [facility] CM Inpatient Consult   08/03/2017  Samantha Aguirre Mar 24, 1945 [id number]   Follow up: Disposition now for home with [facility]  Spoke with inpatient RNCM, [facility] regarding [facility] for post hospital follow up. Patient with [facility] for [place]. [facility] will follow for disposition with [facility] for care and disease management needs.Natividad Brood, RN BSN [facility] Liaison  [phone] business mobile phone Toll free office [phone]

## 2017-08-03 NOTE — Progress Notes (Signed)
Physical Therapy Treatment Patient Details Name: Samantha Aguirre MRN: [id number] DOB: 06-20-1945 Today's Date: 08/03/2017    History of Present Illness Patient is a 72 y/o female presenting with primary complaints of facial droop and aphasia. Of note, recent hospitalization (March 2019) at [facility] for CVA. Patient with a PMH significant for CVA; HTN; HLD; CAD; DM; COPD; diastolic CHF; and afib on Coumadin. MRI confirming acute stroke (Acute 5 mm LEFT internal capsule infarct) - admitted for continued neuro work-up.     PT Comments    Ms. Kopplin doing very well this AM, and motivated to work with PT. Good progress from last visit with patient now only requiring Min guard with transfers and mobility for general safety. Able to progress mobility with RW today without LOB, but does require 2 standing rest breaks due to L knee pain, which is at baseline (ice applied after session). Will update d/c recommendations to HHPT. Patient making good progress towards goals.     Follow Up Recommendations  Home health PT;Supervision - Intermittent     Equipment Recommendations  None recommended by PT    Recommendations for Other Services       Precautions / Restrictions Precautions Precautions: Fall Restrictions Weight Bearing Restrictions: No    Mobility  Bed Mobility Overal bed mobility: Needs Assistance Bed Mobility: Supine to Sit     Supine to sit: Min guard     General bed mobility comments: patietn pulling up from PT hand - no physical assist provided other than stable hand hold  Transfers Overall transfer level: Needs assistance Equipment used: Rolling walker (2 wheeled) Transfers: Sit to/from Stand Sit to Stand: Min guard         General transfer comment: for general safety and immediate standing balance  Ambulation/Gait Ambulation/Gait assistance: Min guard Gait Distance (Feet): 200 Feet Assistive device: Rolling walker (2 wheeled) Gait Pattern/deviations:  Step-through pattern;Decreased stride length;Trunk flexed Gait velocity: decreased   General Gait Details: patient making good progress with gait; does require 2 standing rest breaks due to L knee pain/soreness. Patient feels as if she is making good progress; no LOB, but with min guard for safety   Stairs             Wheelchair Mobility    Modified Rankin (Stroke Patients Only)       Balance Overall balance assessment: Needs assistance;History of Falls Sitting-balance support: No upper extremity supported;Feet supported Sitting balance-Leahy Scale: Good     Standing balance support: Bilateral upper extremity supported;During functional activity Standing balance-Leahy Scale: Fair                              Cognition Arousal/Alertness: Awake/alert Behavior During Therapy: WFL for tasks assessed/performed Overall Cognitive Status: Within Functional Limits for tasks assessed                                        Exercises      General Comments        Pertinent Vitals/Pain Pain Assessment: Faces Faces Pain Scale: Hurts little more Pain Location: L knee Pain Descriptors / Indicators: Aching;Sore Pain Intervention(s): Limited activity within patient's tolerance;Monitored during session;Ice applied    Home Living                      Prior Function  PT Goals (current goals can now be found in the care plan section) Acute Rehab PT Goals Patient Stated Goal: Get back home and keep working on her therapy. PT Goal Formulation: With patient Time For Goal Achievement: 08/14/17 Potential to Achieve Goals: Good Progress towards PT goals: Progressing toward goals    Frequency    Min 4X/week      PT Plan Discharge plan needs to be updated    Co-evaluation              AM-PAC PT "6 Clicks" Daily Activity  Outcome Measure  Difficulty turning over in bed (including adjusting bedclothes, sheets and  blankets)?: A Little Difficulty moving from lying on back to sitting on the side of the bed? : Unable Difficulty sitting down on and standing up from a chair with arms (e.g., wheelchair, bedside commode, etc,.)?: Unable Help needed moving to and from a bed to chair (including a wheelchair)?: A Little Help needed walking in hospital room?: A Little Help needed climbing 3-5 steps with a railing? : A Little 6 Click Score: 14    End of Session Equipment Utilized During Treatment: Gait belt Activity Tolerance: Patient tolerated treatment well Patient left: in chair;with call bell/phone within reach;with chair alarm set Nurse Communication: Mobility status PT Visit Diagnosis: Unsteadiness on feet (R26.81);Other abnormalities of gait and mobility (R26.89);Muscle weakness (generalized) (M62.81);History of falling (Z91.81)     Time: 3545-6256 PT Time Calculation (min) (ACUTE ONLY): 24 min  Charges:  $Gait Training: 8-22 mins $Therapeutic Activity: 8-22 mins                    G Codes:       Lanney Gins, PT, DPT 08/03/17 11:04 AM Pager: [phone]

## 2017-08-04 ENCOUNTER — Other Ambulatory Visit: Payer: Self-pay | Admitting: *Deleted

## 2017-08-04 DIAGNOSIS — I5032 Chronic diastolic (congestive) heart failure: Secondary | ICD-10-CM | POA: Diagnosis not present

## 2017-08-04 DIAGNOSIS — I110 Hypertensive heart disease with heart failure: Secondary | ICD-10-CM | POA: Diagnosis not present

## 2017-08-04 DIAGNOSIS — E1140 Type 2 diabetes mellitus with diabetic neuropathy, unspecified: Secondary | ICD-10-CM | POA: Diagnosis not present

## 2017-08-04 DIAGNOSIS — I2510 Atherosclerotic heart disease of native coronary artery without angina pectoris: Secondary | ICD-10-CM | POA: Diagnosis not present

## 2017-08-04 DIAGNOSIS — I69354 Hemiplegia and hemiparesis following cerebral infarction affecting left non-dominant side: Secondary | ICD-10-CM | POA: Diagnosis not present

## 2017-08-04 DIAGNOSIS — K264 Chronic or unspecified duodenal ulcer with hemorrhage: Secondary | ICD-10-CM | POA: Diagnosis not present

## 2017-08-04 NOTE — Patient Outreach (Addendum)
[facility]) Care Management  [place]  08/04/2017   Samantha Aguirre 17-Nov-1945 [id number]   Telephone Assessment   Transition of care by PCP office of Dr.Hande.   Referral reason : Recent admission to [facility] 7/18-7/22.  Dx: CVA  Patient recent history includes but not limited to :  March 22-27,2019 for COPD, Stroke (acute CVA)with right side weakness Recent hospital admission to [facility] on 6/24-6/29 with GI bleed , Anemia, EGD, Angiodysplasia and duodenum with hemorrhage. Capsule study  Patient transferred to [facility] on 6/29-7/2 for procedure Small intestine endo .  Patient with ED visit at [facility] on 7/3 with complaint of weakness, Melena . PMHx includes but not limited to diabetes, CAD, chronic atrial fib,low back pain, hypertension, GERD, MI .   Subjective:  Successful outreach call to patient , HIPAA verified. Patient discussed her recent admission to hospital for stroke, she discussed home health therapist being with her during the event.   Patient report she stayed with her family on last evening after discharge and they brought her back to her home on this morning. She discussed family has provided meals for her for a few days and will return on Friday with more.  Patient reports she is tired on today. [facility]  Home health has contacted her on today and plans for visit with RN/PTOT on tomorrow and speech therapy on Thursday.   Patient reports she has not contact PCP for visit yet she is just getting settled into being home and plans to make phone calls for follow up with PCP, neurology , and cardiology . Offered to assist patient with making call and she declined stating she likes to do it herself.   Dx: Recent CVA Patient denies any new stroke symptoms when reviewed, states still some left side weakness,  reports she is managing with using her walker in the home . Patient reports tolerating her diet without difficulty swallowing .   Discussed with patient her medicaid benefits , reports she uses transportation services and has to call 3 days in advance, states she is eligible for personal care services of 20 hours a week, reports she has not filled out paperwork but will take to her next MD visit. Discussed with patient [facility] social worker services to assistance she declined at this time stating she has everything handled.   Heart Failure : Patient reports she weighing on her scales this morning with weight of 175 reports she still has some swelling in her legs, but no increased and denies shortness of breath, reviewed worsening symptoms of heart failure , sudden weight gain of 2 pounds in a day and 5 in a week, with increased swelling and shortness of breath, reviewed action plan of notifying MD.   Diabetes Awaiting to receive new prescription for meter to begin testing at home     Medications Patient was recently discharged from hospital and all medications have been reviewed. Patient states her prescription will come from [place] and states they will send them express by [employer] ex.   Patient denies any new concerns at this time and reports managing well at home .   Encounter Medications:  Outpatient Encounter Medications as of 08/04/2017  Medication Sig Note  . albuterol (PROVENTIL HFA;VENTOLIN HFA) 108 (90 Base) MCG/ACT inhaler Inhale 2 puffs into the lungs every 6 (six) hours as needed for wheezing or shortness of breath.   Marland Kitchen aspirin 325 MG tablet Take 1 tablet (325 mg total) by mouth daily.   Marland Kitchen  atorvastatin (LIPITOR) 80 MG tablet Take 1 tablet (80 mg total) by mouth daily at 6 PM.   . diclofenac sodium (VOLTAREN) 1 % GEL Apply 2 g topically 4 (four) times daily. (Patient taking differently: Apply 2 g topically daily as needed. )   . DULoxetine (CYMBALTA) 60 MG capsule Take 1 capsule (60 mg total) by mouth at bedtime.   . feeding supplement, GLUCERNA SHAKE, (GLUCERNA SHAKE) LIQD Take 237 mLs by mouth 2 (two)  times daily between meals.   . ferrous sulfate 325 (65 FE) MG EC tablet Take 1 tablet (325 mg total) by mouth 2 (two) times daily with a meal.   . fluticasone (FLONASE) 50 MCG/ACT nasal spray Place 1 spray into both nostrils daily.   Marland Kitchen gabapentin (NEURONTIN) 800 MG tablet Take 1 tablet (800 mg total) by mouth 2 (two) times daily.   Marland Kitchen glipiZIDE-metformin (METAGLIP) 5-500 MG tablet Take 1 tablet by mouth 2 (two) times daily before a meal. 05/19/2017: Pt taking differently- 2 tablets in am, one tablet in evening.   . levETIRAcetam (KEPPRA) 500 MG tablet Take 1 tablet (500 mg total) by mouth 2 (two) times daily.   Marland Kitchen levothyroxine (SYNTHROID, LEVOTHROID) 175 MCG tablet Take 1 tablet (175 mcg total) by mouth daily before breakfast. (Patient taking differently: Take 112 mcg by mouth daily before breakfast. )   . lisinopril (PRINIVIL,ZESTRIL) 20 MG tablet Take 20 mg by mouth daily.   . nitroGLYCERIN (NITROSTAT) 0.4 MG SL tablet Place 1 tablet (0.4 mg total) under the tongue every 5 (five) minutes as needed for chest pain.   . pantoprazole (PROTONIX) 40 MG tablet Take 1 tablet (40 mg total) by mouth 2 (two) times daily.   . potassium chloride (MICRO-K) 10 MEQ CR capsule Take 1 capsule (10 mEq total) by mouth daily. (Patient taking differently: Take 20 mEq by mouth 2 (two) times daily. )   . sucralfate (CARAFATE) 1 g tablet TAKE (1) TABLET BY MOUTH FOUR TIMES A DAY   . umeclidinium bromide (INCRUSE ELLIPTA) 62.5 MCG/INH AEPB Inhale 1 puff into the lungs daily.   . vitamin B-12 (CYANOCOBALAMIN) 1000 MCG tablet Take 1 tablet by mouth daily.   . blood glucose meter kit and supplies Dispense based on patient and insurance preference. Use up to four times daily as directed. (FOR ICD-10 E10.9, E11.9).   . furosemide (LASIX) 20 MG tablet Take 20 mg by mouth daily.     No facility-administered encounter medications on file as of 08/04/2017.     Functional Status:  In your present state of health, do you have any  difficulty performing the following activities: 08/04/2017 07/30/2017  Hearing? N N  Vision? N N  Difficulty concentrating or making decisions? N N  Walking or climbing stairs? Y Y  Dressing or bathing? N N  Doing errands, shopping? Tempie Donning  Preparing Food and eating ? Y -  Comment family has assisted after discharge -  Using the Toilet? N -  In the past six months, have you accidently leaked urine? Y -  Do you have problems with loss of bowel control? N -  Managing your Medications? N -  Comment - -  Managing your Finances? N -  Housekeeping or managing your Housekeeping? Y -  Comment family assist at times -  Some recent data might be hidden    Fall/Depression Screening: Fall Risk  07/21/2017 05/19/2017 03/10/2017  Falls in the past year? Yes Yes Yes  Number falls in past yr: 2  or more 2 or more 2 or more  Injury with Fall? Yes Yes Yes  Comment - - -  Risk Factor Category  High Fall Risk High Fall Risk High Fall Risk  Risk for fall due to : Impaired balance/gait Impaired balance/gait Impaired balance/gait  Risk for fall due to: Comment - - recently got a cane   Follow up Falls prevention discussed - Falls prevention discussed   PHQ 2/9 Scores 07/21/2017 05/19/2017 03/10/2017 11/24/2016 10/06/2016 11/13/2015  PHQ - 2 Score 0 0 2 0 2 4  PHQ- 9 Score - - 12 - 14 15    Assessment:  Patient will benefit from [facility] care management for complex care management services.  Patient is agreeable to a home visit but states not this first week, so many people are coming.   Plan:  Will schedule home visit in the next week.  Verified patient has my contact number as well as 24 hours nurse line Reinforced notifying MD of worsening of symptoms of heart failure, signs of bleeding and 911 for stroke symptoms.    [facility] CM Care Plan Problem One     Most Recent Value  Care Plan Problem One  High risk for readmission related to recent hospital admission for CVA   Role Documenting the Problem One  Care  Management [place] for Problem One  Active  [facility] Long Term Goal   Patient will not experience a hospital admission in the next 31 days   [facility] Long Term Goal Start Date  08/04/17  Interventions for Problem One Long Term Goal  Reviwed discharge instructions,advised regarding taking medications as prescribed, notifying MD of worsening of symptoms of heart failure , reviewed action plan and yellow zone symptoms, reviewed stroke symptoms and when to get help.50   [facility] CM Short Term Goal #1   Patient will be able to attending all follow up appointments in the next 30 days   [facility] CM Short Term Goal #1 Start Date  07/21/17  Interventions for Short Term Goal #1  Reviewed follow up Medical appointment recommended, offered to assist with scheduling visit, discussed transporttation and advised importance of keeping all appointments   [facility] CM Short Term Goal #2   Patient will report increase strength and balance over the next 30 days   [facility] CM Short Term Goal #2 Start Date  08/04/17 Barrie Folk reset]  Interventions for Short Term Goal #2  Advised the importance of participation physical therapy, occupational and speech.        [name], RN, [place] Management Coordinator  [phone]- Mobile [phone]- Toll Free Main Office

## 2017-08-05 ENCOUNTER — Ambulatory Visit: Payer: Self-pay | Admitting: *Deleted

## 2017-08-05 DIAGNOSIS — I5032 Chronic diastolic (congestive) heart failure: Secondary | ICD-10-CM | POA: Diagnosis not present

## 2017-08-05 DIAGNOSIS — I2510 Atherosclerotic heart disease of native coronary artery without angina pectoris: Secondary | ICD-10-CM | POA: Diagnosis not present

## 2017-08-05 DIAGNOSIS — K264 Chronic or unspecified duodenal ulcer with hemorrhage: Secondary | ICD-10-CM | POA: Diagnosis not present

## 2017-08-05 DIAGNOSIS — E1140 Type 2 diabetes mellitus with diabetic neuropathy, unspecified: Secondary | ICD-10-CM | POA: Diagnosis not present

## 2017-08-05 DIAGNOSIS — I69354 Hemiplegia and hemiparesis following cerebral infarction affecting left non-dominant side: Secondary | ICD-10-CM | POA: Diagnosis not present

## 2017-08-05 DIAGNOSIS — I110 Hypertensive heart disease with heart failure: Secondary | ICD-10-CM | POA: Diagnosis not present

## 2017-08-06 ENCOUNTER — Other Ambulatory Visit: Payer: Self-pay

## 2017-08-06 ENCOUNTER — Encounter (HOSPITAL_COMMUNITY): Payer: Self-pay | Admitting: Emergency Medicine

## 2017-08-06 ENCOUNTER — Emergency Department (HOSPITAL_COMMUNITY): Payer: Medicare Other

## 2017-08-06 ENCOUNTER — Emergency Department (HOSPITAL_COMMUNITY)
Admission: EM | Admit: 2017-08-06 | Discharge: 2017-08-06 | Disposition: A | Discharge status: Home / Self Care | Payer: Medicare Other | Attending: Emergency Medicine | Admitting: Emergency Medicine

## 2017-08-06 DIAGNOSIS — Z79899 Other long term (current) drug therapy: Secondary | ICD-10-CM | POA: Diagnosis not present

## 2017-08-06 DIAGNOSIS — R51 Headache: Secondary | ICD-10-CM | POA: Diagnosis not present

## 2017-08-06 DIAGNOSIS — E119 Type 2 diabetes mellitus without complications: Secondary | ICD-10-CM | POA: Insufficient documentation

## 2017-08-06 DIAGNOSIS — Z85828 Personal history of other malignant neoplasm of skin: Secondary | ICD-10-CM | POA: Diagnosis not present

## 2017-08-06 DIAGNOSIS — E079 Disorder of thyroid, unspecified: Secondary | ICD-10-CM | POA: Insufficient documentation

## 2017-08-06 DIAGNOSIS — Z7984 Long term (current) use of oral hypoglycemic drugs: Secondary | ICD-10-CM | POA: Insufficient documentation

## 2017-08-06 DIAGNOSIS — F17210 Nicotine dependence, cigarettes, uncomplicated: Secondary | ICD-10-CM | POA: Diagnosis not present

## 2017-08-06 DIAGNOSIS — K264 Chronic or unspecified duodenal ulcer with hemorrhage: Secondary | ICD-10-CM | POA: Diagnosis not present

## 2017-08-06 DIAGNOSIS — Z743 Need for continuous supervision: Secondary | ICD-10-CM | POA: Diagnosis not present

## 2017-08-06 DIAGNOSIS — Z7982 Long term (current) use of aspirin: Secondary | ICD-10-CM | POA: Diagnosis not present

## 2017-08-06 DIAGNOSIS — R29810 Facial weakness: Secondary | ICD-10-CM | POA: Insufficient documentation

## 2017-08-06 DIAGNOSIS — I2510 Atherosclerotic heart disease of native coronary artery without angina pectoris: Secondary | ICD-10-CM | POA: Insufficient documentation

## 2017-08-06 DIAGNOSIS — I69354 Hemiplegia and hemiparesis following cerebral infarction affecting left non-dominant side: Secondary | ICD-10-CM | POA: Diagnosis not present

## 2017-08-06 DIAGNOSIS — R519 Headache, unspecified: Secondary | ICD-10-CM

## 2017-08-06 DIAGNOSIS — I5030 Unspecified diastolic (congestive) heart failure: Secondary | ICD-10-CM | POA: Insufficient documentation

## 2017-08-06 DIAGNOSIS — R279 Unspecified lack of coordination: Secondary | ICD-10-CM | POA: Diagnosis not present

## 2017-08-06 DIAGNOSIS — I110 Hypertensive heart disease with heart failure: Secondary | ICD-10-CM | POA: Insufficient documentation

## 2017-08-06 DIAGNOSIS — I639 Cerebral infarction, unspecified: Secondary | ICD-10-CM | POA: Diagnosis not present

## 2017-08-06 DIAGNOSIS — J449 Chronic obstructive pulmonary disease, unspecified: Secondary | ICD-10-CM | POA: Insufficient documentation

## 2017-08-06 DIAGNOSIS — I5032 Chronic diastolic (congestive) heart failure: Secondary | ICD-10-CM | POA: Diagnosis not present

## 2017-08-06 DIAGNOSIS — Z8673 Personal history of transient ischemic attack (TIA), and cerebral infarction without residual deficits: Secondary | ICD-10-CM | POA: Diagnosis not present

## 2017-08-06 DIAGNOSIS — E1140 Type 2 diabetes mellitus with diabetic neuropathy, unspecified: Secondary | ICD-10-CM | POA: Diagnosis not present

## 2017-08-06 LAB — COMPREHENSIVE METABOLIC PANEL
ALBUMIN: 3.5 g/dL (ref 3.5–5.0)
ALT: 14 U/L (ref 0–44)
ANION GAP: 7 (ref 5–15)
AST: 17 U/L (ref 15–41)
Alkaline Phosphatase: 112 U/L (ref 38–126)
BILIRUBIN TOTAL: 0.5 mg/dL (ref 0.3–1.2)
BUN: 7 mg/dL — ABNORMAL LOW (ref 8–23)
CO2: 27 mmol/L (ref 22–32)
Calcium: 9.3 mg/dL (ref 8.9–10.3)
Chloride: 109 mmol/L (ref 98–111)
Creatinine, Ser: 0.59 mg/dL (ref 0.44–1.00)
GFR calc Af Amer: >60 mL/min (ref >60)
GFR calc non Af Amer: >60 mL/min (ref >60)
GLUCOSE: 71 mg/dL (ref 70–99)
POTASSIUM: 4.1 mmol/L (ref 3.5–5.1)
SODIUM: 143 mmol/L (ref 135–145)
TOTAL PROTEIN: 6.5 g/dL (ref 6.5–8.1)

## 2017-08-06 LAB — CBC WITH DIFFERENTIAL/PLATELET
BASOS PCT: 1 %
Basophils Absolute: 0.1 10*3/uL (ref 0.0–0.1)
EOS PCT: 2 %
Eosinophils Absolute: 0.1 10*3/uL (ref 0.0–0.7)
HCT: 34.3 % — ABNORMAL LOW (ref 36.0–46.0)
Hemoglobin: 9.7 g/dL — ABNORMAL LOW (ref 12.0–15.0)
LYMPHS ABS: 2.2 10*3/uL (ref 0.7–4.0)
Lymphocytes Relative: 34 %
MCH: 24.1 pg — AB (ref 26.0–34.0)
MCHC: 28.3 g/dL — ABNORMAL LOW (ref 30.0–36.0)
MCV: 85.3 fL (ref 78.0–100.0)
MONO ABS: 0.6 10*3/uL (ref 0.1–1.0)
Monocytes Relative: 9 %
Neutro Abs: 3.6 10*3/uL (ref 1.7–7.7)
Neutrophils Relative %: 54 %
Platelets: 288 10*3/uL (ref 150–400)
RBC: 4.02 MIL/uL (ref 3.87–5.11)
RDW: 23.5 % — AB (ref 11.5–15.5)
WBC: 6.6 10*3/uL (ref 4.0–10.5)

## 2017-08-06 MED ORDER — METOCLOPRAMIDE HCL 5 MG/ML IJ SOLN
10.0000 mg | Freq: Once | INTRAMUSCULAR | Status: AC
  Administered 2017-08-06: 10 mg via INTRAVENOUS
  Filled 2017-08-06: qty 2

## 2017-08-06 MED ORDER — ACETAMINOPHEN 325 MG PO TABS
650.0000 mg | ORAL_TABLET | Freq: Once | ORAL | Status: AC
  Administered 2017-08-06: 650 mg via ORAL
  Filled 2017-08-06: qty 2

## 2017-08-06 NOTE — ED Notes (Signed)
[facility] contacted to return patient home 

## 2017-08-06 NOTE — Clinical Social Work Note (Signed)
Clinical Social Work Assessment  Patient Details  Name: Samantha Aguirre MRN: [id number] Date of Birth: 1945-03-26  Date of referral:  08/06/17               Reason for consult:  Facility Placement                Permission sought to share information with:  Family Supports, Case Manager, Other(Home Health Agency: [facility]) Permission granted to share information::  Yes, Verbal Permission Granted  Name::        Agency::     Relationship::     Contact Information:     Housing/Transportation Living arrangements for the past 2 months:  Single Family Home Source of Information:  Patient Patient Interpreter Needed:  None Criminal Activity/Legal Involvement Pertinent to Current Situation/Hospitalization:  No - Comment as needed Significant Relationships:  Adult Children Lives with:  Self Do you feel safe going back to the place where you live?  Yes Need for family participation in patient care:  Yes (Comment)  Care giving concerns:  CSW consulted for SNF placement. Pt recently discharged from inpatient. While inpatient on 7/22 PT recommended pt for home health.   Social Worker assessment / plan:  CSW met with pt at pt's bedside. Pt stated she wants to go home with home health. Pt established with home health through [facility]. Pt stated she has had them in the past. Pt stated she has a Astronomer, OT, PT, RN, and CSW. ED CSW explained to pt if pt changes her mind and decides that she would like to go to SNF, CSW from [facility] can assist with that. [facility] is aware that pt is in the hospital. Pt stated that she has a good relationship with her daughter who lives in [place]. Pt states her daughter calls her 3-4 times a day to check in on her. Pt informed CSW that she came to the hospital today due to a headache. Pt explained that she has a consistent headache but today it was far worse than normal. Her doctor suggested that pt come into the ED to have the headache checked out.    Employment status:  Retired Forensic scientist:  Information systems manager, Medicaid In [place] PT Recommendations:  Not assessed at this time(HH on 7/22) Information / Referral to community resources:     Patient/Family's Response to care:  Pt agreeable to plan of care. Pt informed CSW that she would like to go home with home health.   Patient/Family's Understanding of and Emotional Response to Diagnosis, Current Treatment, and Prognosis:  Pt did not have any questions or concerns for CSW at this time.   Emotional Assessment Appearance:  Appears stated age Attitude/Demeanor/Rapport:    Affect (typically observed):  Accepting, Calm, Appropriate, Pleasant Orientation:  Oriented to Self, Oriented to Place, Oriented to  Time, Oriented to Situation Alcohol / Substance use:    Psych involvement (Current and /or in the community):  No (Comment)  Discharge Needs  Concerns to be addressed:  Home Safety Concerns Readmission within the last 30 days:  No Current discharge risk:  Lives alone Barriers to Discharge:  Barriers Resolved, Continued Medical Work up   [employer], LCSW 08/06/2017, 3:55 PM

## 2017-08-06 NOTE — ED Notes (Signed)
Pt given gingerale and crackers 

## 2017-08-06 NOTE — Progress Notes (Addendum)
CWS aware of consult. CSW will follow up with pt.   Update: CSW met with pt at pt's bedside. Pt expressed wanting to go home with home health. Pt was established with home health through [facility] during recent inpatient visit. Pt stated she has all services, Speech therapist, PT, OT, RN, and CSW through [facility].   Pt is being followed by [facility]. Pt gave CSW permission to reach out to [facility] to update them about ED visit. Pt stated that she is going to have a personal care agent to help with housework established through her Medicaid. Pt stated she needs to go to her PCP to have him sign/complete the paperwork. Pt's appointment with PCP is on 8/2. Pt states family visits regularly and checks in on her when she can. Pt states that she has someone visiting her everyday whether it is home health personal or family.   Wendelyn Breslow, [facility]  [phone]

## 2017-08-06 NOTE — ED Provider Notes (Signed)
[facility] DE[facility]MENT Provider Note   CSN: [id number] Arrival date & time: 08/06/17  1129     History   Chief Complaint Chief Complaint  Patient presents with  . Headache    HPI Leoni Goodness is a 72 y.o. female.  The history is provided by the patient. No language interpreter was used.  Headache     Ethne Jeon is a 72 y.o. female who presents to the Emergency Department complaining of HA. She presents to the emergency department complaining of headache that began one week ago when she was diagnosed with a stroke. Her headache is located on the top of her head and is constant nature. She has been taking Tylenol at home with no significant improvement in her symptoms. She denies any fevers, vomiting, chest pain, shortness of breath. She does feel like the left lower part of her face is drooping more than previously. She also reports several weeks of generalized abdominal pain, cough. When she mentioned to her speech therapist that she was having an ongoing headache her doctor was contacted and she was told to present to the emergency department for further evaluation. Past Medical History:  Diagnosis Date  . Acid reflux   . Arthritis    Bilateral knees, hands, feet  . Asthma   . Atrial fibrillation ([place])   . Cancer ([place])   . CHF (congestive heart failure) (HCC)    Diastolic CHF  . COPD (chronic obstructive pulmonary disease) ([place])   . Coronary artery disease   . Diabetes mellitus without complication ([place])   . Frequent headaches   . GI bleed   . HLD (hyperlipidemia)   . Hypertension   . Seizures ([place])   . Skin cancer 2015   Suspected basal cell carcinoma on nose, surgically removed  . Stroke ([place]) 04/2017  . Thyroid disease   . Tremors of nervous system     Patient Active Problem List   Diagnosis Date Noted  . PAF (paroxysmal atrial fibrillation) ([place])   . Acute CVA (cerebrovascular accident) ([place]) 07/30/2017  . GI bleed  07/08/2017  . Anemia 07/08/2017  . Angiodysplasia of stomach and duodenum with hemorrhage   . CVA (cerebral vascular accident) ([place]) 07/06/2017  . Stroke (cerebrum) ([place])   . Goals of care, counseling/discussion   . Palliative care encounter   . COPD exacerbation ([place]) 04/03/2017  . Flu 03/27/2017  . Iron deficiency anemia due to chronic blood loss   . Migraine 02/13/2017  . Cataracts, bilateral 01/15/2017  . Chronic venous insufficiency 10/06/2016  . Recurrent major depressive disorder, in partial remission ([place]) 10/06/2016  . Left-sided weakness 10/03/2016  . Chest pain 02/27/2016  . History of stroke 02/19/2016  . Thumb pain, left 01/22/2016  . Traumatic closed nondisplaced fracture of base of metacarpal bone of left thumb 01/22/2016  . Colon cancer screening 11/27/2015  . Depression 11/14/2015  . Coronary artery disease 11/13/2015  . Hypertension 11/13/2015  . History of skin cancer 11/13/2015  . Osteoporosis 11/13/2015  . Chronic low back pain 11/13/2015  . Left medial knee pain 11/13/2015  . Osteoarthritis of multiple joints 11/13/2015  . Other specified hypothyroidism 08/16/2015  . Controlled type 2 diabetes mellitus with diabetic neuropathy ([place]) 08/16/2015  . COPD (chronic obstructive pulmonary disease) ([place]) 08/16/2015  . Tobacco abuse 08/16/2015  . HLD (hyperlipidemia) 08/16/2015  . History of CHF (congestive heart failure) 08/16/2015  . Seizure disorder [facility])     Past Surgical History:  Procedure Laterality Date  .  ABDOMINAL HYSTERECTOMY  1976  . APPENDECTOMY  1980  . CAROTID ENDARTERECTOMY    . CHOLECYSTECTOMY  1980  . COLONOSCOPY N/A 02/20/2017   Procedure: COLONOSCOPY;  Surgeon: Lin Landsman, MD;  Location: [facility] ENDOSCOPY;  Service: Gastroenterology;  Laterality: N/A;  . ESOPHAGOGASTRODUODENOSCOPY N/A 02/20/2017   Procedure: ESOPHAGOGASTRODUODENOSCOPY (EGD);  Surgeon: Lin Landsman, MD;  Location: [facility] ENDOSCOPY;  Service: Gastroenterology;   Laterality: N/A;  . ESOPHAGOGASTRODUODENOSCOPY (EGD) WITH PROPOFOL N/A 07/08/2017   Procedure: ESOPHAGOGASTRODUODENOSCOPY (EGD) WITH PROPOFOL;  Surgeon: [name], MD;  Location: [facility] ENDOSCOPY;  Service: Endoscopy;  Laterality: N/A;  . [place]  . GIVENS CAPSULE STUDY N/A 07/09/2017   Procedure: GIVENS CAPSULE STUDY;  Surgeon: Jonathon Bellows, MD;  Location: [facility] ENDOSCOPY;  Service: Gastroenterology;  Laterality: N/A;  . KNEE SURGERY     left  . ROTATOR CUFF REPAIR     right  . TONSILLECTOMY  1950     OB History   None      Home Medications    Prior to Admission medications   Medication Sig Start Date End Date Taking? Authorizing Provider  albuterol (PROVENTIL HFA;VENTOLIN HFA) 108 (90 Base) MCG/ACT inhaler Inhale 2 puffs into the lungs every 6 (six) hours as needed for wheezing or shortness of breath. 10/08/16  Yes Karamalegos, Devonne Doughty, DO  aspirin 325 MG tablet Take 1 tablet (325 mg total) by mouth daily. 08/03/17  Yes Regalado, Belkys A, MD  atorvastatin (LIPITOR) 80 MG tablet Take 1 tablet (80 mg total) by mouth daily at 6 PM. 09/24/16  Yes Karamalegos, Devonne Doughty, DO  diclofenac sodium (VOLTAREN) 1 % GEL Apply 2 g topically 4 (four) times daily. Patient taking differently: Apply 2 g topically daily as needed.  01/11/16  Yes Daymon Larsen, MD  DULoxetine (CYMBALTA) 60 MG capsule Take 1 capsule (60 mg total) by mouth at bedtime. 09/24/16  Yes Karamalegos, Devonne Doughty, DO  feeding supplement, GLUCERNA SHAKE, (GLUCERNA SHAKE) LIQD Take 237 mLs by mouth 2 (two) times daily between meals. 08/03/17  Yes Regalado, Belkys A, MD  ferrous sulfate 325 (65 FE) MG EC tablet Take 1 tablet (325 mg total) by mouth 2 (two) times daily with a meal. 02/21/17  Yes Sudini, Srikar, MD  fluticasone (FLONASE) 50 MCG/ACT nasal spray Place 1 spray into both nostrils daily. 10/08/16  Yes Karamalegos, Devonne Doughty, DO  furosemide (LASIX) 20 MG tablet Take 20 mg by mouth daily.    Yes  Karamalegos, Devonne Doughty, DO  gabapentin (NEURONTIN) 800 MG tablet Take 1 tablet (800 mg total) by mouth 2 (two) times daily. 09/24/16  Yes Karamalegos, Devonne Doughty, DO  glipiZIDE-metformin (METAGLIP) 5-500 MG tablet Take 1 tablet by mouth 2 (two) times daily before a meal. 04/02/17  Yes Karamalegos, Devonne Doughty, DO  levETIRAcetam (KEPPRA) 500 MG tablet Take 1 tablet (500 mg total) by mouth 2 (two) times daily. 09/19/15  Yes Epifanio Lesches, MD  levothyroxine (SYNTHROID, LEVOTHROID) 175 MCG tablet Take 1 tablet (175 mcg total) by mouth daily before breakfast. Patient taking differently: Take 112 mcg by mouth daily before breakfast.  09/24/16  Yes Karamalegos, Devonne Doughty, DO  lisinopril (PRINIVIL,ZESTRIL) 20 MG tablet Take 20 mg by mouth daily. 07/16/17  Yes [provider]  nitroGLYCERIN (NITROSTAT) 0.4 MG SL tablet Place 1 tablet (0.4 mg total) under the tongue every 5 (five) minutes as needed for chest pain. 02/28/16  Yes Mody, Ulice Bold, MD  pantoprazole (PROTONIX) 40 MG tablet Take 1 tablet (40 mg  total) by mouth 2 (two) times daily. 08/03/17  Yes Regalado, Belkys A, MD  potassium chloride (MICRO-K) 10 MEQ CR capsule Take 1 capsule (10 mEq total) by mouth daily. Patient taking differently: Take 20 mEq by mouth 2 (two) times daily.  09/24/16  Yes Karamalegos, Devonne Doughty, DO  sucralfate (CARAFATE) 1 g tablet TAKE (1) TABLET BY MOUTH FOUR TIMES A DAY 03/26/17  Yes Karamalegos, Devonne Doughty, DO  umeclidinium bromide (INCRUSE ELLIPTA) 62.5 MCG/INH AEPB Inhale 1 puff into the lungs daily. 11/13/15  Yes Karamalegos, Devonne Doughty, DO  vitamin B-12 (CYANOCOBALAMIN) 1000 MCG tablet Take 1 tablet by mouth daily. 06/29/17  Yes [provider]  blood glucose meter kit and supplies Dispense based on patient and insurance preference. Use up to four times daily as directed. (FOR ICD-10 E10.9, E11.9). 08/03/17   Regalado, Cassie Freer, MD    Family History Family History  Problem Relation Age of Onset  .  Breast cancer Mother   . Heart disease Mother   . Stroke Mother   . Cancer Mother   . COPD Mother   . Diabetes Mother   . Heart disease Father   . Diabetes Father   . Stroke Father   . Alcohol abuse Sister   . Drug abuse Sister   . Stroke Sister   . Cancer Sister   . Mental illness Sister   . Heart disease Brother   . Arthritis Brother   . Diabetes Brother   . Heart disease Maternal Grandfather   . Heart disease Paternal Grandfather     Social History Social History   Tobacco Use  . Smoking status: Current Every Day Smoker    Packs/day: 1.00    Years: 60.00    Pack years: 60.00    Types: Cigarettes  . Smokeless tobacco: Never Used  Substance Use Topics  . Alcohol use: No  . Drug use: No     Allergies   Methotrexate; Mushroom extract complex; Atenolol; Ivp dye [iodinated diagnostic agents]; Morphine and related; Oxycodone hcl; Percocet [oxycodone-acetaminophen]; Percodan [oxycodone-aspirin]; and Verapamil   Review of Systems Review of Systems  Neurological: Positive for headaches.  All other systems reviewed and are negative.    Physical Exam Updated Vital Signs BP 109/61   Pulse (!) 51   Temp 98.6 F (37 C)   Resp 18   Ht 5' 1" (1.549 m)   Wt 83 kg (183 lb)   SpO2 100%   BMI 34.58 kg/m   Physical Exam  Constitutional: She is oriented to person, place, and time. She appears well-developed and well-nourished.  HENT:  Head: Normocephalic and atraumatic.  Eyes: Pupils are equal, round, and reactive to light. EOM are normal.  Cardiovascular: Normal rate and regular rhythm.  No murmur heard. Pulmonary/Chest: Effort normal and breath sounds normal. No respiratory distress.  Abdominal: Soft. There is no rebound and no guarding.  Mild generalized abdominal tenderness  Musculoskeletal: She exhibits no edema or tenderness.  Neurological: She is alert and oriented to person, place, and time.  Mild left lower facial droop.  3+/5 strength in LLE.  5/5  strength in BUE, RLE.    Skin: Skin is warm and dry.  Psychiatric: She has a normal mood and affect. Her behavior is normal.  Nursing note and vitals reviewed.    ED Treatments / Results  Labs (all labs ordered are listed, but only abnormal results are displayed) Labs Reviewed  CBC WITH DIFFERENTIAL/PLATELET - Abnormal; Notable for the following components:  Result Value   Hemoglobin 9.7 (*)    HCT 34.3 (*)    MCH 24.1 (*)    MCHC 28.3 (*)    RDW 23.5 (*)    All other components within normal limits  COMPREHENSIVE METABOLIC PANEL - Abnormal; Notable for the following components:   BUN 7 (*)    All other components within normal limits    EKG None  Radiology Ct Head Wo Contrast  Result Date: 08/06/2017 CLINICAL DATA:  72 year old female recently admitted for cerebral infarct. Headache following discharge from the hospital. EXAM: CT HEAD WITHOUT CONTRAST TECHNIQUE: Contiguous axial images were obtained from the base of the skull through the vertex without intravenous contrast. COMPARISON:  Brain MRI and intracranial MRA 07/30/2017 and earlier. FINDINGS: Brain: The small lacunar infarct along the inferior posterior aspect of the left deep gray matter and deep white matter capsules remains occult by CT. Posterior left hemisphere encephalomalacia is stable. No midline shift, ventriculomegaly, mass effect, evidence of mass lesion, intracranial hemorrhage or evidence of cortically based acute infarction. Stable small infarcts in the cerebellum. Vascular: Calcified atherosclerosis at the skull base. No suspicious intracranial vascular hyperdensity. Skull: No acute osseous abnormality identified. Sinuses/Orbits: Visualized paranasal sinuses and mastoids are stable and well pneumatized. Other: Stable orbit and scalp soft tissues. IMPRESSION: 1. Unchanged non contrast CT appearance of the brain. 2. The small left hemisphere lacunar infarct seen by MRI on 07/30/2017 remains occult by CT.  Electronically Signed   By: Genevie Ann M.D.   On: 08/06/2017 12:59    Procedures Procedures (including critical care time)  Medications Ordered in ED Medications  acetaminophen (TYLENOL) tablet 650 mg (650 mg Oral Given 08/06/17 1300)  metoCLOPramide (REGLAN) injection 10 mg (10 mg Intravenous Given 08/06/17 1450)     Initial Impression / Assessment and Plan / ED Course  I have reviewed the triage vital signs and the nursing notes.  Pertinent labs & imaging results that were available during my care of the patient were reviewed by me and considered in my medical decision making (see chart for details).     Patient here for evaluation of headache, recently admitted for CVA. She does have some left lower extremity weakness, left lower facial weakness that is similar compared to her prior deficits. No concerns for new deficits. Presentation is not consistent with acute CVA, hemorrhagic conversion. Following treatment in the emergency department her headache is improving. Patient states that she wants to do whatever it takes to get better and is considering going to rehab if this is what it takes. She will meet with social work regarding what her care options are for rehab versus home health. Patient is safe for discharge based on social work plan. Final Clinical Impressions(s) / ED Diagnoses   Final diagnoses:  None    ED Discharge Orders    None       Quintella Reichert, MD 08/06/17 1614

## 2017-08-06 NOTE — ED Triage Notes (Addendum)
Pt arrives via EMS from home with headache since being discharged from the hospital Monday for stroke. Per patient was about to get her scheduled speech therapy this morning but they felt headache was too severe to do therapy today. They called her PMD who felt pt should be evaluated in the ER. Pt denies any new neuro deficits VSS. Awake, alert, appropriate. Left sided facial drop,  left arm, leg weakness since stroke. VSS.

## 2017-08-07 DIAGNOSIS — E1140 Type 2 diabetes mellitus with diabetic neuropathy, unspecified: Secondary | ICD-10-CM | POA: Diagnosis not present

## 2017-08-07 DIAGNOSIS — I2510 Atherosclerotic heart disease of native coronary artery without angina pectoris: Secondary | ICD-10-CM | POA: Diagnosis not present

## 2017-08-07 DIAGNOSIS — I5032 Chronic diastolic (congestive) heart failure: Secondary | ICD-10-CM | POA: Diagnosis not present

## 2017-08-07 DIAGNOSIS — I69354 Hemiplegia and hemiparesis following cerebral infarction affecting left non-dominant side: Secondary | ICD-10-CM | POA: Diagnosis not present

## 2017-08-07 DIAGNOSIS — I110 Hypertensive heart disease with heart failure: Secondary | ICD-10-CM | POA: Diagnosis not present

## 2017-08-07 DIAGNOSIS — K264 Chronic or unspecified duodenal ulcer with hemorrhage: Secondary | ICD-10-CM | POA: Diagnosis not present

## 2017-08-08 ENCOUNTER — Other Ambulatory Visit: Payer: Self-pay

## 2017-08-08 NOTE — Patient Outreach (Signed)
Transition of care:  Follow up for [facility] assigned case manager.  Patient in the Emergency Department on 08/06/2017 for headache.  Placed call to patient who answered and reports she is doing about the same. Reports that she continues to have a headache. Reports she got an injection in the hospital and that helped some.  Patient reports that she wants to go to rehab. Patient reports that she thought the hospital was working on this but now is unsure. Patient states she was previously at [facility].   Patient reports [facility] health [facility] with [facility] came to see her today.    PLAN: reminded patient of scheduled home visit for next week. Placed order for [facility] social worker to assist with placement from home. Will provide update to [facility] assigned case manager. Encouraged patient to call for any concerns.   Tomasa Rand, RN, BSN, CEN [facility] [employer] [phone]

## 2017-08-08 NOTE — Addendum Note (Signed)
Addended by: Thana Ates on: 08/08/2017 11:34 AM   Modules accepted: Orders

## 2017-08-12 ENCOUNTER — Other Ambulatory Visit: Payer: Self-pay | Admitting: *Deleted

## 2017-08-12 DIAGNOSIS — E785 Hyperlipidemia, unspecified: Secondary | ICD-10-CM | POA: Diagnosis not present

## 2017-08-12 DIAGNOSIS — J449 Chronic obstructive pulmonary disease, unspecified: Secondary | ICD-10-CM | POA: Diagnosis not present

## 2017-08-12 DIAGNOSIS — E1140 Type 2 diabetes mellitus with diabetic neuropathy, unspecified: Secondary | ICD-10-CM | POA: Diagnosis not present

## 2017-08-12 DIAGNOSIS — G40909 Epilepsy, unspecified, not intractable, without status epilepticus: Secondary | ICD-10-CM | POA: Diagnosis not present

## 2017-08-12 DIAGNOSIS — I10 Essential (primary) hypertension: Secondary | ICD-10-CM | POA: Diagnosis not present

## 2017-08-12 DIAGNOSIS — Z720 Tobacco use: Secondary | ICD-10-CM | POA: Diagnosis not present

## 2017-08-12 DIAGNOSIS — I2510 Atherosclerotic heart disease of native coronary artery without angina pectoris: Secondary | ICD-10-CM | POA: Diagnosis not present

## 2017-08-12 DIAGNOSIS — I219 Acute myocardial infarction, unspecified: Secondary | ICD-10-CM | POA: Diagnosis not present

## 2017-08-12 DIAGNOSIS — E782 Mixed hyperlipidemia: Secondary | ICD-10-CM | POA: Diagnosis not present

## 2017-08-12 DIAGNOSIS — I69392 Facial weakness following cerebral infarction: Secondary | ICD-10-CM | POA: Diagnosis not present

## 2017-08-12 DIAGNOSIS — I482 Chronic atrial fibrillation: Secondary | ICD-10-CM | POA: Diagnosis not present

## 2017-08-12 NOTE — Patient Outreach (Addendum)
[name]) Care Management  08/12/2017  Samantha Aguirre 1945-06-09 [id number]   Patient referred to this social worker by [facility] to assist with facility placement at patient's request. Unsuccessful attempt #1 to reach patient. There was no voicemail set up to leave a message.   Contact letter sent  This social worker will make another attempt to call patient within 3-4 business day.  [name] [facility] Care Management [phone]

## 2017-08-13 ENCOUNTER — Other Ambulatory Visit: Payer: Self-pay | Admitting: *Deleted

## 2017-08-13 ENCOUNTER — Other Ambulatory Visit: Payer: Self-pay | Admitting: Family Medicine

## 2017-08-13 DIAGNOSIS — I69354 Hemiplegia and hemiparesis following cerebral infarction affecting left non-dominant side: Secondary | ICD-10-CM | POA: Diagnosis not present

## 2017-08-13 DIAGNOSIS — K264 Chronic or unspecified duodenal ulcer with hemorrhage: Secondary | ICD-10-CM | POA: Diagnosis not present

## 2017-08-13 DIAGNOSIS — G43919 Migraine, unspecified, intractable, without status migrainosus: Secondary | ICD-10-CM

## 2017-08-13 DIAGNOSIS — E1140 Type 2 diabetes mellitus with diabetic neuropathy, unspecified: Secondary | ICD-10-CM | POA: Diagnosis not present

## 2017-08-13 DIAGNOSIS — I110 Hypertensive heart disease with heart failure: Secondary | ICD-10-CM | POA: Diagnosis not present

## 2017-08-13 DIAGNOSIS — I2510 Atherosclerotic heart disease of native coronary artery without angina pectoris: Secondary | ICD-10-CM | POA: Diagnosis not present

## 2017-08-13 DIAGNOSIS — I5032 Chronic diastolic (congestive) heart failure: Secondary | ICD-10-CM | POA: Diagnosis not present

## 2017-08-13 NOTE — Patient Outreach (Addendum)
[facility]) Care Management  [facility] Social Work  08/13/2017  Brie Eppard 1945-09-01 [id number]  Subjective:  Co-visit with RNCM [name]. Patient is a 72 year old female currenlty receiving [facility] services through [facility]. Patient states that the doctor is recommending return to rehab to strengthen her left side and work on her speech. Patient agrees with returning to rehab and has an appointment with Dr. Ginette Pitman on 08/14/17. Dr. Linton Ham nurse to send [facility] following appointment tomorrow. Patient uses [facility] to meet her medical  transportation needs. Phone call to [facility]  to confirm that they could be used for general trips. Paper wok generated and will be mailed to patient to complete for general trips which would cost $3.00 each way.   Objective:   Encounter Medications:  Outpatient Encounter Medications as of 08/13/2017  Medication Sig  . albuterol (PROVENTIL HFA;VENTOLIN HFA) 108 (90 Base) MCG/ACT inhaler Inhale 2 puffs into the lungs every 6 (six) hours as needed for wheezing or shortness of breath.  Marland Kitchen aspirin 325 MG tablet Take 1 tablet (325 mg total) by mouth daily.  Marland Kitchen atorvastatin (LIPITOR) 80 MG tablet Take 1 tablet (80 mg total) by mouth daily at 6 PM.  . blood glucose meter kit and supplies Dispense based on patient and insurance preference. Use up to four times daily as directed. (FOR ICD-10 E10.9, E11.9).  Marland Kitchen diclofenac sodium (VOLTAREN) 1 % GEL Apply 2 g topically 4 (four) times daily. (Patient taking differently: Apply 2 g topically daily as needed. )  . DULoxetine (CYMBALTA) 60 MG capsule Take 1 capsule (60 mg total) by mouth at bedtime.  . feeding supplement, GLUCERNA SHAKE, (GLUCERNA SHAKE) LIQD Take 237 mLs by mouth 2 (two) times daily between meals.  . ferrous sulfate 325 (65 FE) MG EC tablet Take 1 tablet (325 mg total) by mouth 2 (two) times daily with a meal.  . fluticasone (FLONASE) 50 MCG/ACT nasal spray Place 1 spray into both nostrils daily.  .  furosemide (LASIX) 20 MG tablet Take 20 mg by mouth daily.   Marland Kitchen gabapentin (NEURONTIN) 800 MG tablet Take 1 tablet (800 mg total) by mouth 2 (two) times daily.  Marland Kitchen glipiZIDE-metformin (METAGLIP) 5-500 MG tablet Take 1 tablet by mouth 2 (two) times daily before a meal.  . levETIRAcetam (KEPPRA) 500 MG tablet Take 1 tablet (500 mg total) by mouth 2 (two) times daily.  Marland Kitchen levothyroxine (SYNTHROID, LEVOTHROID) 175 MCG tablet Take 1 tablet (175 mcg total) by mouth daily before breakfast. (Patient taking differently: Take 112 mcg by mouth daily before breakfast. )  . lisinopril (PRINIVIL,ZESTRIL) 20 MG tablet Take 20 mg by mouth daily.  . nitroGLYCERIN (NITROSTAT) 0.4 MG SL tablet Place 1 tablet (0.4 mg total) under the tongue every 5 (five) minutes as needed for chest pain.  . pantoprazole (PROTONIX) 40 MG tablet Take 1 tablet (40 mg total) by mouth 2 (two) times daily.  . potassium chloride (MICRO-K) 10 MEQ CR capsule Take 1 capsule (10 mEq total) by mouth daily. (Patient taking differently: Take 20 mEq by mouth 2 (two) times daily. )  . sucralfate (CARAFATE) 1 g tablet TAKE (1) TABLET BY MOUTH FOUR TIMES A DAY  . umeclidinium bromide (INCRUSE ELLIPTA) 62.5 MCG/INH AEPB Inhale 1 puff into the lungs daily.  . vitamin B-12 (CYANOCOBALAMIN) 1000 MCG tablet Take 1 tablet by mouth daily.   No facility-administered encounter medications on file as of 08/13/2017.     Functional Status:  In your present state of health,  do you have any difficulty performing the following activities: 08/04/2017 07/30/2017  Hearing? N N  Vision? N N  Difficulty concentrating or making decisions? N N  Walking or climbing stairs? Y Y  Dressing or bathing? N N  Doing errands, shopping? Y Y  Preparing Food and eating ? Y -  Comment family has assisted after discharge -  Using the Toilet? N -  In the past six months, have you accidently leaked urine? Y -  Do you have problems with loss of bowel control? N -  Managing your  Medications? N -  Comment - -  Managing your Finances? N -  Housekeeping or managing your Housekeeping? Y -  Comment family assist at times -  Some recent data might be hidden    Fall/Depression Screening:  PHQ 2/9 Scores 07/21/2017 05/19/2017 03/10/2017 11/24/2016 10/06/2016 11/13/2015  PHQ - 2 Score 0 0 2 0 2 4  PHQ- 9 Score - - 12 - 14 15    Assessment: Patient agreeable to return to rehab, preferably [facility]. This social worker spoke to patient's daughter Erinne who stated that she has been in contact with patient's primary care doctor's nurse who states that she is working on the FL2 and will fax the FL2 to [facility] if there is a bed available. This social worker contacted [facility], left a message with Debra Meyers in admissions to confirm bed availability. Patient also working with social worker from [facility] on requesting a in home aid. Patient has the request forms and understands that she will need to have the request forms signed by the doctor for processing.   Plan: This social worker to follow up with patient regarding her return for rehab services as well as request for personal care services post discharge from rehab.    , LCSW [facility] Care Management [phone]  

## 2017-08-13 NOTE — Patient Outreach (Signed)
[facility]) Care Management  08/13/2017  Samantha Aguirre [birth year] [id number]   Phone call from [name] from [facility] stating that they do have a rehab bed and would like the [facility] faxed to [phone].  Phone call to Dr. Linton Ham nurse, left message requesting FL2 be faxed to [facility] [phone].  Phone call from [place] at [facility] informing her of plan to return to reha.   [name] [facility] Care Management [phone]

## 2017-08-13 NOTE — Patient Outreach (Signed)
[facility]) Care Management   08/13/2017  Samantha Aguirre [birth year] [id number]  Samantha Aguirre is an 72 y.o. female  Subjective:  Patient complaint of headache, backache but not as bad as when she went to ED on last week.   Patient reports she walked too much on yesterday,walked down the sidewalk at  least 4 blocks to get to store.  Patient reports having trouble finding words at times  Patient discussed visit with cardiology on yesterday,and plans for watchman procedure at [facility] or [facility] MD to refer and contact her  Patient discussed that her doctor wants her to go back to rehab and she is agreeable and [facility] has started on process.   Patient reports not sleeping well, for the last week.    Objective:  BP 124/64 (BP Location: Right Arm, Patient Position: Sitting, Cuff Size: Normal)   Pulse 73   Resp 18   Ht 1.549 m ('5\' 1"' )   SpO2 97%   BMI 34.58 kg/m  Review of Systems  Constitutional: Negative.   HENT: Negative.   Eyes: Negative.   Respiratory: Negative.   Cardiovascular: Negative.   Gastrointestinal: Negative.   Musculoskeletal: Positive for joint pain.  Skin: Negative.   Neurological:       Complaint of trouble finding words at times  Endo/Heme/Allergies: Negative.   Psychiatric/Behavioral: Negative.     Physical Exam  Constitutional: She is oriented to person, place, and time. She appears well-developed and well-nourished.  Cardiovascular: Normal rate and normal heart sounds.  Respiratory: Effort normal and breath sounds normal.  GI: Soft.  Neurological: She is alert and oriented to person, place, and time.  Skin: Skin is warm and dry.  Psychiatric: She has a normal mood and affect. Her behavior is normal. Judgment and thought content normal.    Encounter Medications:   Outpatient Encounter Medications as of 08/13/2017  Medication Sig  . albuterol (PROVENTIL HFA;VENTOLIN HFA) 108 (90 Base) MCG/ACT inhaler Inhale 2 puffs into the lungs  every 6 (six) hours as needed for wheezing or shortness of breath.  Marland Kitchen aspirin 325 MG tablet Take 1 tablet (325 mg total) by mouth daily.  Marland Kitchen atorvastatin (LIPITOR) 80 MG tablet Take 1 tablet (80 mg total) by mouth daily at 6 PM.  . blood glucose meter kit and supplies Dispense based on patient and insurance preference. Use up to four times daily as directed. (FOR ICD-10 E10.9, E11.9).  Marland Kitchen diclofenac sodium (VOLTAREN) 1 % GEL Apply 2 g topically 4 (four) times daily. (Patient taking differently: Apply 2 g topically daily as needed. )  . DULoxetine (CYMBALTA) 60 MG capsule Take 1 capsule (60 mg total) by mouth at bedtime.  . feeding supplement, GLUCERNA SHAKE, (GLUCERNA SHAKE) LIQD Take 237 mLs by mouth 2 (two) times daily between meals.  . ferrous sulfate 325 (65 FE) MG EC tablet Take 1 tablet (325 mg total) by mouth 2 (two) times daily with a meal.  . fluticasone (FLONASE) 50 MCG/ACT nasal spray Place 1 spray into both nostrils daily.  . furosemide (LASIX) 20 MG tablet Take 20 mg by mouth daily.   Marland Kitchen gabapentin (NEURONTIN) 800 MG tablet Take 1 tablet (800 mg total) by mouth 2 (two) times daily.  Marland Kitchen glipiZIDE-metformin (METAGLIP) 5-500 MG tablet Take 1 tablet by mouth 2 (two) times daily before a meal.  . levETIRAcetam (KEPPRA) 500 MG tablet Take 1 tablet (500 mg total) by mouth 2 (two) times daily.  Marland Kitchen levothyroxine (SYNTHROID, LEVOTHROID) 175 MCG tablet Take 1  tablet (175 mcg total) by mouth daily before breakfast. (Patient taking differently: Take 112 mcg by mouth daily before breakfast. )  . lisinopril (PRINIVIL,ZESTRIL) 20 MG tablet Take 20 mg by mouth daily.  . nitroGLYCERIN (NITROSTAT) 0.4 MG SL tablet Place 1 tablet (0.4 mg total) under the tongue every 5 (five) minutes as needed for chest pain.  . pantoprazole (PROTONIX) 40 MG tablet Take 1 tablet (40 mg total) by mouth 2 (two) times daily.  . potassium chloride (MICRO-K) 10 MEQ CR capsule Take 1 capsule (10 mEq total) by mouth daily. (Patient  taking differently: Take 20 mEq by mouth 2 (two) times daily. )  . sucralfate (CARAFATE) 1 g tablet TAKE (1) TABLET BY MOUTH FOUR TIMES A DAY  . umeclidinium bromide (INCRUSE ELLIPTA) 62.5 MCG/INH AEPB Inhale 1 puff into the lungs daily.  . vitamin B-12 (CYANOCOBALAMIN) 1000 MCG tablet Take 1 tablet by mouth daily.   No facility-administered encounter medications on file as of 08/13/2017.     Functional Status:   In your present state of health, do you have any difficulty performing the following activities: 08/04/2017 07/30/2017  Hearing? N N  Vision? N N  Difficulty concentrating or making decisions? N N  Walking or climbing stairs? Y Y  Dressing or bathing? N N  Doing errands, shopping? Tempie Donning  Preparing Food and eating ? Y -  Comment family has assisted after discharge -  Using the Toilet? N -  In the past six months, have you accidently leaked urine? Y -  Do you have problems with loss of bowel control? N -  Managing your Medications? N -  Comment - -  Managing your Finances? N -  Housekeeping or managing your Housekeeping? Y -  Comment family assist at times -  Some recent data might be hidden    Fall/Depression Screening:    Fall Risk  07/21/2017 05/19/2017 03/10/2017  Falls in the past year? Yes Yes Yes  Number falls in past yr: 2 or more 2 or more 2 or more  Injury with Fall? Yes Yes Yes  Comment - - -  Risk Factor Category  High Fall Risk High Fall Risk High Fall Risk  Risk for fall due to : Impaired balance/gait Impaired balance/gait Impaired balance/gait  Risk for fall due to: Comment - - recently got a cane   Follow up Falls prevention discussed - Falls prevention discussed   PHQ 2/9 Scores 07/21/2017 05/19/2017 03/10/2017 11/24/2016 10/06/2016 11/13/2015  PHQ - 2 Score 0 0 2 0 2 4  PHQ- 9 Score - - 12 - 14 15    Assessment:  Routine home visit with Chrystal Land, LCSW. [facility]PT/OT/ST.  CVA-, denies any new stroke symptoms when  reviewed. Patient wants  Rehab at [facility],  [facility] working on process. GIB- no new signs of bleeding , not taking eliquis  Diabetes - new prescriptions for meter has been sent to pharmacy her meter is broke .  Safety/fall risk - cannot not afford medic alert system, using walker needs reminder to use in home as well.   Social - limited support with grocery shopping,THN  LCSW working on  identifying  medicaid benefit for non medical trips.  HF- today 163 denies increase in weight gain, monitors weight daily does not keep a record. Review zones of heart failure,identified as being  in green zone,   Medications - patient receives packed medication through [facility], packaged medication does include  eliquis, patient able to identify Eliquis and remove it from package and not take medication. Patient reports she expects new packaging that will not include Eliquis.     Plan:  Will plan follow up call in the next week.  Will send PCP visit note. Provided EMMI on Preventing second stroke  Provided EMMI on heart failure when to call , and reviewed along with yellow zone symptoms and action plan. Provided [facility] calendar book and instructed how to use for documenting weights. Fall precautions reviewed.  Will send PCP visit note.   THN CM Care Plan Problem One     Most Recent Value  Care Plan Problem One  High risk for readmission related to recent hospital admission for CVA   Role Documenting the Problem One  Care Management [place] for Problem One  Active  [facility] Long Term Goal   Patient will not experience a hospital admission in the next 31 days   THN Long Term Goal Start Date  08/04/17  Interventions for Problem One Long Term Goal  Advised regarding continuing to medical plan, reviewed recent cardiology visit instructions, encouraged to continue to read educaiton on watchman device, reviewed signs of stroke and action plan , reinforced notifying Md of concerns of worsening of headache.    THN CM Short Term Goal #1   Patient will be able to attending all follow up appointments in the next 30 days   THN CM Short Term Goal #1 Start Date  07/21/17  Interventions for Short Term Goal #1  Reviewed upcoming medical appointments and transporation   THN CM Short Term Goal #2   Patient will report increase strength and balance over the next 30 days   THN CM Short Term Goal #2 Start Date  08/04/17 Barrie Folk reset]  Interventions for Short Term Goal #2  reinforced using participation in all therapies and using walker for safety in home as well, advised regarding standing a few seconds when first arising before starting to walk .       [name], RN, [place] Management Coordinator  [phone]- Mobile [phone]- Toll Free Main Office

## 2017-08-14 ENCOUNTER — Other Ambulatory Visit: Payer: Self-pay | Admitting: *Deleted

## 2017-08-14 DIAGNOSIS — M25562 Pain in left knee: Secondary | ICD-10-CM | POA: Diagnosis not present

## 2017-08-14 DIAGNOSIS — Z09 Encounter for follow-up examination after completed treatment for conditions other than malignant neoplasm: Secondary | ICD-10-CM | POA: Diagnosis not present

## 2017-08-14 DIAGNOSIS — E785 Hyperlipidemia, unspecified: Secondary | ICD-10-CM | POA: Diagnosis not present

## 2017-08-14 DIAGNOSIS — R2689 Other abnormalities of gait and mobility: Secondary | ICD-10-CM | POA: Diagnosis not present

## 2017-08-14 DIAGNOSIS — M1712 Unilateral primary osteoarthritis, left knee: Secondary | ICD-10-CM | POA: Diagnosis not present

## 2017-08-14 DIAGNOSIS — D649 Anemia, unspecified: Secondary | ICD-10-CM | POA: Diagnosis not present

## 2017-08-14 DIAGNOSIS — I2510 Atherosclerotic heart disease of native coronary artery without angina pectoris: Secondary | ICD-10-CM | POA: Diagnosis not present

## 2017-08-14 DIAGNOSIS — E1140 Type 2 diabetes mellitus with diabetic neuropathy, unspecified: Secondary | ICD-10-CM | POA: Diagnosis not present

## 2017-08-14 DIAGNOSIS — I10 Essential (primary) hypertension: Secondary | ICD-10-CM | POA: Diagnosis not present

## 2017-08-14 DIAGNOSIS — I482 Chronic atrial fibrillation: Secondary | ICD-10-CM | POA: Diagnosis not present

## 2017-08-14 DIAGNOSIS — Z8673 Personal history of transient ischemic attack (TIA), and cerebral infarction without residual deficits: Secondary | ICD-10-CM | POA: Diagnosis not present

## 2017-08-14 DIAGNOSIS — Z720 Tobacco use: Secondary | ICD-10-CM | POA: Diagnosis not present

## 2017-08-14 DIAGNOSIS — R262 Difficulty in walking, not elsewhere classified: Secondary | ICD-10-CM | POA: Diagnosis not present

## 2017-08-14 DIAGNOSIS — E786 Lipoprotein deficiency: Secondary | ICD-10-CM | POA: Diagnosis not present

## 2017-08-14 NOTE — Patient Outreach (Signed)
[facility]) Care Management  08/14/2017  Samantha Aguirre 1945-11-11 [id number]   Phone call from Hilda Blades in admissions at [facility] stating that she received the FL2, however did not receive enough information to make a bed offer at this time. It was also noted that patient does not have the qualifiers for insurance to cover her stay. . It patient were to be admitted under her Medicaid , she would have to apply for long term Medicaid and would  potentially have to pay for part of her stay up front.   Plan: This Education officer, museum will inform patient that a bed offer cannot be provided at this time.   [name] [facility] Care Management [phone]

## 2017-08-14 NOTE — Patient Outreach (Addendum)
[facility]) Care Management  08/14/2017  Samantha Aguirre 1945/09/03 [id number]   Phone call to patient informing her of [facility] decision to not extend bed offer based on limited information received on the FL2 and the lack of qualifiers for insurance to authorize her stay. Patient had specific questions  It was recommended that patient contact [facility] for additional information. Per patient, she gave the application for personal care services to her primary care doctor to complete and they will fax it to [facility].  Plan: This Education officer, museum will follow up with patient within 2 weeks to follow up on personal care services request.   [place], [place] Management [place], [place] Management [phone]

## 2017-08-17 ENCOUNTER — Ambulatory Visit: Payer: Self-pay | Admitting: *Deleted

## 2017-08-19 DIAGNOSIS — E1140 Type 2 diabetes mellitus with diabetic neuropathy, unspecified: Secondary | ICD-10-CM | POA: Diagnosis not present

## 2017-08-19 DIAGNOSIS — I5032 Chronic diastolic (congestive) heart failure: Secondary | ICD-10-CM | POA: Diagnosis not present

## 2017-08-19 DIAGNOSIS — I2510 Atherosclerotic heart disease of native coronary artery without angina pectoris: Secondary | ICD-10-CM | POA: Diagnosis not present

## 2017-08-19 DIAGNOSIS — I69354 Hemiplegia and hemiparesis following cerebral infarction affecting left non-dominant side: Secondary | ICD-10-CM | POA: Diagnosis not present

## 2017-08-19 DIAGNOSIS — I110 Hypertensive heart disease with heart failure: Secondary | ICD-10-CM | POA: Diagnosis not present

## 2017-08-19 DIAGNOSIS — K264 Chronic or unspecified duodenal ulcer with hemorrhage: Secondary | ICD-10-CM | POA: Diagnosis not present

## 2017-08-20 ENCOUNTER — Other Ambulatory Visit: Payer: Self-pay | Admitting: *Deleted

## 2017-08-20 DIAGNOSIS — I2510 Atherosclerotic heart disease of native coronary artery without angina pectoris: Secondary | ICD-10-CM | POA: Diagnosis not present

## 2017-08-20 DIAGNOSIS — K264 Chronic or unspecified duodenal ulcer with hemorrhage: Secondary | ICD-10-CM | POA: Diagnosis not present

## 2017-08-20 DIAGNOSIS — I110 Hypertensive heart disease with heart failure: Secondary | ICD-10-CM | POA: Diagnosis not present

## 2017-08-20 DIAGNOSIS — I5032 Chronic diastolic (congestive) heart failure: Secondary | ICD-10-CM | POA: Diagnosis not present

## 2017-08-20 DIAGNOSIS — I69354 Hemiplegia and hemiparesis following cerebral infarction affecting left non-dominant side: Secondary | ICD-10-CM | POA: Diagnosis not present

## 2017-08-20 DIAGNOSIS — E1140 Type 2 diabetes mellitus with diabetic neuropathy, unspecified: Secondary | ICD-10-CM | POA: Diagnosis not present

## 2017-08-20 NOTE — Patient Outreach (Signed)
[facility]) Care Management  08/20/2017  Samantha Aguirre 23-Aug-1945 [id number]   Telephone follow up call   Referral reason : Recent admission to [facility] 7/18-7/22.  Dx: CVA  Patient recent history includes but not limited to :  March 22-27,2019 for COPD, Stroke (acute CVA)with right side weakness Recent hospital admission to [facility] on 6/24-6/29 with GI bleed , Anemia, EGD, Angiodysplasia and duodenum with hemorrhage. Capsule study  Patient transferred to [facility] on 6/29-7/2 for procedure Small intestine endo .  Patient with ED visit at [facility] on 7/3 with complaint of weakness, Melena . PMHx includes but not limited to diabetes, CAD, chronic atrial fib,low back pain, hypertension, GERD, MI .  Unsuccessful outreach call to patient , no answer , no voicemail set up.   Placed call to patient daughter on [facility] consent , Samantha Aguirre, confirmed HIPAA x 2 identifiers.  Daughter discussed that patient phone will be out of service until after 2 pm. Daughter discussed patient concerns of not being approved to go to [facility] at this time and patient does not have money to pay out of pocket. Patient daughter asking about medical alert system, and patient would benefit it, explained to daughter , cost   Will plan return call to patient later on today.  1500  Returned call to patient successful outreach , confirmed HIPAA identifiers x2.  Patient reports that she is feeling pretty good on today, she discussed visit with speech therapy on today, and nurse and PT on yesterday, patient reports making good progress with physical and occupational as well as speech but concerned sometimes it is difficulty with recalling and takes her a little time to get her words out at time.   Patient discussed her recent PCP visit and new prescription for  medication for headache that has been sent to her [facility] she is awaiting delivery of that as well as glucose meter.   Patient  reports that she is l awaiting call from [facility] regarding whether she will be referred for the [facility] device.   Patient has dental visit in the next week to follow up on getting new dentures as she has had the same ones for many years and they are loose,  she has arranged transportation . Discussed with patient daughter concern regarding patient having a medical alert system, patient states she cannot afford as she explained during home visit.    Patient verified that she is not taking eliquis, and taking medication as prescribed. Patient denies any new symptoms of GI bleed.  Patient reports her weight today is 161, she has lost 5 pounds in the last week. Reports having food available in home.  Patient states that she is awaiting to see if [facility] will pay for Glucerna that she cannot afford.  Patient denies any increase in headache, or any new weakness or stroke symptoms when reviewed.     Plan Will plan follow up call to patient in the next 2 weeks to follow up on possible watchman device. Reinforced symptoms of stroke and action plan to notify for emergency follow up . Will collaborate with [employer] , LCSW.   [name], RN, [place] Management Coordinator  [phone]- Mobile [phone]- Toll Free Main Office

## 2017-08-21 ENCOUNTER — Other Ambulatory Visit: Payer: Self-pay | Admitting: *Deleted

## 2017-08-21 ENCOUNTER — Ambulatory Visit: Payer: Self-pay | Admitting: *Deleted

## 2017-08-21 DIAGNOSIS — I110 Hypertensive heart disease with heart failure: Secondary | ICD-10-CM | POA: Diagnosis not present

## 2017-08-21 DIAGNOSIS — I5032 Chronic diastolic (congestive) heart failure: Secondary | ICD-10-CM | POA: Diagnosis not present

## 2017-08-21 DIAGNOSIS — K264 Chronic or unspecified duodenal ulcer with hemorrhage: Secondary | ICD-10-CM | POA: Diagnosis not present

## 2017-08-21 DIAGNOSIS — E1140 Type 2 diabetes mellitus with diabetic neuropathy, unspecified: Secondary | ICD-10-CM | POA: Diagnosis not present

## 2017-08-21 DIAGNOSIS — I2510 Atherosclerotic heart disease of native coronary artery without angina pectoris: Secondary | ICD-10-CM | POA: Diagnosis not present

## 2017-08-21 DIAGNOSIS — I69354 Hemiplegia and hemiparesis following cerebral infarction affecting left non-dominant side: Secondary | ICD-10-CM | POA: Diagnosis not present

## 2017-08-21 NOTE — Patient Outreach (Addendum)
[facility]) Care Management  08/21/2017  Samantha Aguirre [birth year] [id number]   Phone call to patient to follow up on personal care service request faxed to [facility] health care as well as to discuss options for a med alert. Patient to be referred to Med Alert through the [facility] [phone].  Patient called, however a message came on stating that the "mailbox is not currently accepting messages because voicemail has not yet been activated and has not yet been activated". This Education officer, museum will make another attempt to reach patient within 2 weeks.   [name] [facility] Care Management [phone]

## 2017-08-24 DIAGNOSIS — I2510 Atherosclerotic heart disease of native coronary artery without angina pectoris: Secondary | ICD-10-CM | POA: Diagnosis not present

## 2017-08-24 DIAGNOSIS — E1140 Type 2 diabetes mellitus with diabetic neuropathy, unspecified: Secondary | ICD-10-CM | POA: Diagnosis not present

## 2017-08-24 DIAGNOSIS — I110 Hypertensive heart disease with heart failure: Secondary | ICD-10-CM | POA: Diagnosis not present

## 2017-08-24 DIAGNOSIS — I69354 Hemiplegia and hemiparesis following cerebral infarction affecting left non-dominant side: Secondary | ICD-10-CM | POA: Diagnosis not present

## 2017-08-24 DIAGNOSIS — I5032 Chronic diastolic (congestive) heart failure: Secondary | ICD-10-CM | POA: Diagnosis not present

## 2017-08-24 DIAGNOSIS — K264 Chronic or unspecified duodenal ulcer with hemorrhage: Secondary | ICD-10-CM | POA: Diagnosis not present

## 2017-08-25 DIAGNOSIS — I2510 Atherosclerotic heart disease of native coronary artery without angina pectoris: Secondary | ICD-10-CM | POA: Diagnosis not present

## 2017-08-25 DIAGNOSIS — I69354 Hemiplegia and hemiparesis following cerebral infarction affecting left non-dominant side: Secondary | ICD-10-CM | POA: Diagnosis not present

## 2017-08-25 DIAGNOSIS — E1140 Type 2 diabetes mellitus with diabetic neuropathy, unspecified: Secondary | ICD-10-CM | POA: Diagnosis not present

## 2017-08-25 DIAGNOSIS — I110 Hypertensive heart disease with heart failure: Secondary | ICD-10-CM | POA: Diagnosis not present

## 2017-08-25 DIAGNOSIS — I5032 Chronic diastolic (congestive) heart failure: Secondary | ICD-10-CM | POA: Diagnosis not present

## 2017-08-25 DIAGNOSIS — K264 Chronic or unspecified duodenal ulcer with hemorrhage: Secondary | ICD-10-CM | POA: Diagnosis not present

## 2017-08-27 DIAGNOSIS — I2510 Atherosclerotic heart disease of native coronary artery without angina pectoris: Secondary | ICD-10-CM | POA: Diagnosis not present

## 2017-08-27 DIAGNOSIS — K264 Chronic or unspecified duodenal ulcer with hemorrhage: Secondary | ICD-10-CM | POA: Diagnosis not present

## 2017-08-27 DIAGNOSIS — I5032 Chronic diastolic (congestive) heart failure: Secondary | ICD-10-CM | POA: Diagnosis not present

## 2017-08-27 DIAGNOSIS — I69354 Hemiplegia and hemiparesis following cerebral infarction affecting left non-dominant side: Secondary | ICD-10-CM | POA: Diagnosis not present

## 2017-08-27 DIAGNOSIS — I110 Hypertensive heart disease with heart failure: Secondary | ICD-10-CM | POA: Diagnosis not present

## 2017-08-27 DIAGNOSIS — E1140 Type 2 diabetes mellitus with diabetic neuropathy, unspecified: Secondary | ICD-10-CM | POA: Diagnosis not present

## 2017-08-28 ENCOUNTER — Other Ambulatory Visit: Payer: Self-pay | Admitting: *Deleted

## 2017-08-28 DIAGNOSIS — K264 Chronic or unspecified duodenal ulcer with hemorrhage: Secondary | ICD-10-CM | POA: Diagnosis not present

## 2017-08-28 DIAGNOSIS — I5032 Chronic diastolic (congestive) heart failure: Secondary | ICD-10-CM | POA: Diagnosis not present

## 2017-08-28 DIAGNOSIS — I69354 Hemiplegia and hemiparesis following cerebral infarction affecting left non-dominant side: Secondary | ICD-10-CM | POA: Diagnosis not present

## 2017-08-28 DIAGNOSIS — I110 Hypertensive heart disease with heart failure: Secondary | ICD-10-CM | POA: Diagnosis not present

## 2017-08-28 DIAGNOSIS — I2510 Atherosclerotic heart disease of native coronary artery without angina pectoris: Secondary | ICD-10-CM | POA: Diagnosis not present

## 2017-08-28 DIAGNOSIS — E1140 Type 2 diabetes mellitus with diabetic neuropathy, unspecified: Secondary | ICD-10-CM | POA: Diagnosis not present

## 2017-08-28 NOTE — Patient Outreach (Signed)
[facility]) Care Management  08/28/2017  Samantha Aguirre 1945-08-31 [id number]   Telephone follow up call   Successful follow up call to patient , confirmed HIPAA x 2 identifiers.   Patient discussed feeling okay on today , she discussed that she is making good progress with  Home heath physical therapy ,  no falls at home , still using walker .    Patient further discussed :  Heart Failure  Reports weights remain stable in the 170 to 173 range, no increase in swelling denies increase shortness of breath . Review of worsening signs of heart failure   Recent CVA Patient discussed recent notifying EMS that visited her at home related complaint of numbness of hand and foot, denied any other symptoms , reports it was not a stroke. Patient states she is aware of stroke symptoms and to seek medical attention . Patient reports HH was present  and MD was aware. Patient reports she will follow up with [facility] on Monday regarding follow up on plan for watchman procedure.   Recent compliant of eye irritation  Patient discussed having recent problem with left eye irritation , patient states she thought she had MRSA notified MD for eye drop, was informed that she needed to be seen in office. Patient states she was unable to be seen for same day visit due to transportation . Patient denies eye being red, itchy or swelling, states no discharge .  Nutrition  Patient dicussed appetite is decreased some days she does not feel hungry. Patient reports that she has groceries in home, her daughter did shopping for her this week. Discussed small meals throughout the day, instead of 3 full meals ,and supplementing meals with nutrition drink such as ensure, patient discussed cost concern.  Patient discussed concern on last week of diarrhea and stomach  discomfort that is relieved, denies nausea.   Diabetes  Patient states [facility] will not fill prescription for meter, but she  can get if filled at local pharmacy. Offered to assist with contacting, patient states she will handle it when she decides which pharmacy she wants to use. Patient discussed having podiatry visit in the next week.   Patient denies any other concerns, she is agreeable to follow up call in the next 2 weeks.    Plan  Will plan follow up call in the next 2 weeks and discuss next month home visit.  Reinforced worsening symptoms to notify MD/ or seek medical attention , stroke symptoms ,and shortness of breath sudden weight gain symptoms of 2 pounds overnight, or 5 in a week.  [name], RN, [place] Management Coordinator  [phone]- Mobile [phone]- Toll Free Main Office

## 2017-08-29 ENCOUNTER — Other Ambulatory Visit: Payer: Self-pay | Admitting: Family Medicine

## 2017-08-29 DIAGNOSIS — G43919 Migraine, unspecified, intractable, without status migrainosus: Secondary | ICD-10-CM

## 2017-08-31 ENCOUNTER — Other Ambulatory Visit: Payer: Self-pay | Admitting: *Deleted

## 2017-08-31 ENCOUNTER — Encounter: Payer: Self-pay | Admitting: *Deleted

## 2017-08-31 DIAGNOSIS — L851 Acquired keratosis [keratoderma] palmaris et plantaris: Secondary | ICD-10-CM | POA: Diagnosis not present

## 2017-08-31 DIAGNOSIS — I110 Hypertensive heart disease with heart failure: Secondary | ICD-10-CM | POA: Diagnosis not present

## 2017-08-31 DIAGNOSIS — I69354 Hemiplegia and hemiparesis following cerebral infarction affecting left non-dominant side: Secondary | ICD-10-CM | POA: Diagnosis not present

## 2017-08-31 DIAGNOSIS — E1140 Type 2 diabetes mellitus with diabetic neuropathy, unspecified: Secondary | ICD-10-CM | POA: Diagnosis not present

## 2017-08-31 DIAGNOSIS — I5032 Chronic diastolic (congestive) heart failure: Secondary | ICD-10-CM | POA: Diagnosis not present

## 2017-08-31 DIAGNOSIS — I2510 Atherosclerotic heart disease of native coronary artery without angina pectoris: Secondary | ICD-10-CM | POA: Diagnosis not present

## 2017-08-31 DIAGNOSIS — K264 Chronic or unspecified duodenal ulcer with hemorrhage: Secondary | ICD-10-CM | POA: Diagnosis not present

## 2017-08-31 DIAGNOSIS — B351 Tinea unguium: Secondary | ICD-10-CM | POA: Diagnosis not present

## 2017-08-31 NOTE — Patient Outreach (Addendum)
[place] [facility]) Care Management  08/31/2017  Brittanie Dosanjh 1945-06-21 [id number]   Phone call to patient to follow up on referral for in home care. Per patient, she has not received a call from [place] regarding her request for a in home assessment. Patient further states that she has not heard from the [place] program through [facility].    Plan: This social worker to follow up on referral to [facility] for personal care services. This Education officer, museum will collaborate with Landis Martins, RNCM regarding the Watchman program. This social worker will follow up with patient within 2 weeks.   [name] [facility] Care Management [phone]

## 2017-09-01 DIAGNOSIS — I2510 Atherosclerotic heart disease of native coronary artery without angina pectoris: Secondary | ICD-10-CM | POA: Diagnosis not present

## 2017-09-01 DIAGNOSIS — K264 Chronic or unspecified duodenal ulcer with hemorrhage: Secondary | ICD-10-CM | POA: Diagnosis not present

## 2017-09-01 DIAGNOSIS — I69354 Hemiplegia and hemiparesis following cerebral infarction affecting left non-dominant side: Secondary | ICD-10-CM | POA: Diagnosis not present

## 2017-09-01 DIAGNOSIS — I5032 Chronic diastolic (congestive) heart failure: Secondary | ICD-10-CM | POA: Diagnosis not present

## 2017-09-01 DIAGNOSIS — E1140 Type 2 diabetes mellitus with diabetic neuropathy, unspecified: Secondary | ICD-10-CM | POA: Diagnosis not present

## 2017-09-01 DIAGNOSIS — I110 Hypertensive heart disease with heart failure: Secondary | ICD-10-CM | POA: Diagnosis not present

## 2017-09-02 DIAGNOSIS — I110 Hypertensive heart disease with heart failure: Secondary | ICD-10-CM | POA: Diagnosis not present

## 2017-09-02 DIAGNOSIS — I5032 Chronic diastolic (congestive) heart failure: Secondary | ICD-10-CM | POA: Diagnosis not present

## 2017-09-02 DIAGNOSIS — K264 Chronic or unspecified duodenal ulcer with hemorrhage: Secondary | ICD-10-CM | POA: Diagnosis not present

## 2017-09-02 DIAGNOSIS — I2510 Atherosclerotic heart disease of native coronary artery without angina pectoris: Secondary | ICD-10-CM | POA: Diagnosis not present

## 2017-09-02 DIAGNOSIS — E1140 Type 2 diabetes mellitus with diabetic neuropathy, unspecified: Secondary | ICD-10-CM | POA: Diagnosis not present

## 2017-09-02 DIAGNOSIS — I69354 Hemiplegia and hemiparesis following cerebral infarction affecting left non-dominant side: Secondary | ICD-10-CM | POA: Diagnosis not present

## 2017-09-02 NOTE — Telephone Encounter (Signed)
This encounter was created in error - please disregard.

## 2017-09-03 ENCOUNTER — Telehealth: Payer: Self-pay | Admitting: *Deleted

## 2017-09-03 DIAGNOSIS — I5032 Chronic diastolic (congestive) heart failure: Secondary | ICD-10-CM | POA: Diagnosis not present

## 2017-09-03 DIAGNOSIS — I69354 Hemiplegia and hemiparesis following cerebral infarction affecting left non-dominant side: Secondary | ICD-10-CM | POA: Diagnosis not present

## 2017-09-03 DIAGNOSIS — E1140 Type 2 diabetes mellitus with diabetic neuropathy, unspecified: Secondary | ICD-10-CM | POA: Diagnosis not present

## 2017-09-03 DIAGNOSIS — Z122 Encounter for screening for malignant neoplasm of respiratory organs: Secondary | ICD-10-CM

## 2017-09-03 DIAGNOSIS — I2510 Atherosclerotic heart disease of native coronary artery without angina pectoris: Secondary | ICD-10-CM | POA: Diagnosis not present

## 2017-09-03 DIAGNOSIS — K264 Chronic or unspecified duodenal ulcer with hemorrhage: Secondary | ICD-10-CM | POA: Diagnosis not present

## 2017-09-03 DIAGNOSIS — I110 Hypertensive heart disease with heart failure: Secondary | ICD-10-CM | POA: Diagnosis not present

## 2017-09-03 NOTE — Telephone Encounter (Signed)
Received a referral for initial lung cancer screening scan.  Contacted the patient and obtained their smoking history, currently smoking with a 53 pkyr history  as well as answering questions related to screening process.  Patient denies signs of lung cancer such as weight loss or hemoptysis at this time.  Patient denies comorbidity that would prevent curative treatment if lung cancer were found.  Patient is scheduled for the Shared Decision Making Visit and CT scan on 9-3-@1315  .

## 2017-09-04 DIAGNOSIS — I110 Hypertensive heart disease with heart failure: Secondary | ICD-10-CM | POA: Diagnosis not present

## 2017-09-04 DIAGNOSIS — E1140 Type 2 diabetes mellitus with diabetic neuropathy, unspecified: Secondary | ICD-10-CM | POA: Diagnosis not present

## 2017-09-04 DIAGNOSIS — I69354 Hemiplegia and hemiparesis following cerebral infarction affecting left non-dominant side: Secondary | ICD-10-CM | POA: Diagnosis not present

## 2017-09-04 DIAGNOSIS — K264 Chronic or unspecified duodenal ulcer with hemorrhage: Secondary | ICD-10-CM | POA: Diagnosis not present

## 2017-09-04 DIAGNOSIS — I5032 Chronic diastolic (congestive) heart failure: Secondary | ICD-10-CM | POA: Diagnosis not present

## 2017-09-04 DIAGNOSIS — I2510 Atherosclerotic heart disease of native coronary artery without angina pectoris: Secondary | ICD-10-CM | POA: Diagnosis not present

## 2017-09-07 ENCOUNTER — Other Ambulatory Visit: Payer: Self-pay | Admitting: *Deleted

## 2017-09-07 NOTE — Patient Outreach (Addendum)
[facility] [facility]) Care Management  09/07/2017  Samantha Aguirre 1945-07-17 [id number]   Phone call to [facility] on patient's behalf to follow up on request form for an assessment for personal care services that was faxed to [facility] by patient's providers office. [place] states that they have not received a request.   Phone call to patient's providers office, spoke with Sharyn Lull who will send a message to the patient's nurse regarding the request form for personal care services.  Plan: This Education officer, museum to follow up within 2 weeks.   [name] [facility] Care Management [phone]

## 2017-09-08 ENCOUNTER — Other Ambulatory Visit: Payer: Self-pay | Admitting: *Deleted

## 2017-09-08 NOTE — Patient Outreach (Signed)
[facility]) Care Management  09/08/2017  Ilisha Blust [birth year] [id number]   Return phone call from [facility], nurse at [place] who states that she does not have on record that they completed the request form for personal care services in their records, however agrees to complete and fax a new form to [employer].  [name] [facility] Care Management [phone]

## 2017-09-09 ENCOUNTER — Ambulatory Visit: Payer: Self-pay | Admitting: *Deleted

## 2017-09-09 ENCOUNTER — Other Ambulatory Visit: Payer: Self-pay | Admitting: *Deleted

## 2017-09-09 DIAGNOSIS — I2510 Atherosclerotic heart disease of native coronary artery without angina pectoris: Secondary | ICD-10-CM | POA: Diagnosis not present

## 2017-09-09 DIAGNOSIS — I5032 Chronic diastolic (congestive) heart failure: Secondary | ICD-10-CM | POA: Diagnosis not present

## 2017-09-09 DIAGNOSIS — I69354 Hemiplegia and hemiparesis following cerebral infarction affecting left non-dominant side: Secondary | ICD-10-CM | POA: Diagnosis not present

## 2017-09-09 DIAGNOSIS — I110 Hypertensive heart disease with heart failure: Secondary | ICD-10-CM | POA: Diagnosis not present

## 2017-09-09 DIAGNOSIS — K264 Chronic or unspecified duodenal ulcer with hemorrhage: Secondary | ICD-10-CM | POA: Diagnosis not present

## 2017-09-09 DIAGNOSIS — E1140 Type 2 diabetes mellitus with diabetic neuropathy, unspecified: Secondary | ICD-10-CM | POA: Diagnosis not present

## 2017-09-09 NOTE — Patient Outreach (Addendum)
[place] [facility]) Care Management  09/09/2017  Samantha Aguirre April 15, 1945 [id number]   Telephone follow up call   Unsuccessful outreach call to patient, no answer message states voice mail not setup , unable to leave a message.   Placed call to patient daughter Tashya Alberty, HIPAA verified. Daughter reports patient phone is off states it cuts off just before she gets paid. She anticipates phone will be back on by end of the week. Daughter is not aware of any new plans for previously discussed watchman device.    Care coordination call to [facility] home [facility] spoke with Ailene Ravel , discussed PT/OT visit on this week, services will be extended, no new nursing concerns.    Plan Will plan return call in the next week to schedule home visit.    [name], RN, [place] Management Coordinator  [phone]- Mobile [phone]- Toll Free Main Office

## 2017-09-10 ENCOUNTER — Telehealth: Payer: Self-pay | Admitting: *Deleted

## 2017-09-10 ENCOUNTER — Encounter: Payer: Self-pay | Admitting: *Deleted

## 2017-09-10 NOTE — Telephone Encounter (Signed)
Attempted to contact patient r/t LDCT Screening follow up due at this time. Attempted to contact patient r/t LDCT Screening follow up due at this time.  No answer received, unable to leave message at this time, will attempt contact at later date.   

## 2017-09-11 ENCOUNTER — Other Ambulatory Visit: Payer: Self-pay | Admitting: *Deleted

## 2017-09-11 DIAGNOSIS — I2510 Atherosclerotic heart disease of native coronary artery without angina pectoris: Secondary | ICD-10-CM | POA: Diagnosis not present

## 2017-09-11 DIAGNOSIS — E1140 Type 2 diabetes mellitus with diabetic neuropathy, unspecified: Secondary | ICD-10-CM | POA: Diagnosis not present

## 2017-09-11 DIAGNOSIS — I5032 Chronic diastolic (congestive) heart failure: Secondary | ICD-10-CM | POA: Diagnosis not present

## 2017-09-11 DIAGNOSIS — I110 Hypertensive heart disease with heart failure: Secondary | ICD-10-CM | POA: Diagnosis not present

## 2017-09-11 DIAGNOSIS — K264 Chronic or unspecified duodenal ulcer with hemorrhage: Secondary | ICD-10-CM | POA: Diagnosis not present

## 2017-09-11 DIAGNOSIS — I69354 Hemiplegia and hemiparesis following cerebral infarction affecting left non-dominant side: Secondary | ICD-10-CM | POA: Diagnosis not present

## 2017-09-11 NOTE — Patient Outreach (Signed)
  [facility] [facility]) Care Management  09/11/2017  Larisha Vencill [birth year] [id number]   Phone call to patient to follow up on request form for personal care services. This Education officer, museum informed patient that a call was made to her provider's office to follow up on the request form and confirmed that it was not completed. New form requested.Benefits of in home assistance discussed.  Patient discussed having knee pain and states using her walker to avoid falls. Patient will discuss knee pain during next provider's office visit. Patient uses [facility] for transportation to medical appointments,  Plan: This Education officer, museum will follow up with patient within 3 weeks to follow up on in home personal care service referral   [place], [place] Management [phone]

## 2017-09-13 DIAGNOSIS — I480 Paroxysmal atrial fibrillation: Secondary | ICD-10-CM | POA: Diagnosis not present

## 2017-09-13 DIAGNOSIS — I2510 Atherosclerotic heart disease of native coronary artery without angina pectoris: Secondary | ICD-10-CM | POA: Diagnosis not present

## 2017-09-13 DIAGNOSIS — E1140 Type 2 diabetes mellitus with diabetic neuropathy, unspecified: Secondary | ICD-10-CM | POA: Diagnosis not present

## 2017-09-13 DIAGNOSIS — I110 Hypertensive heart disease with heart failure: Secondary | ICD-10-CM | POA: Diagnosis not present

## 2017-09-13 DIAGNOSIS — I5032 Chronic diastolic (congestive) heart failure: Secondary | ICD-10-CM | POA: Diagnosis not present

## 2017-09-13 DIAGNOSIS — I69354 Hemiplegia and hemiparesis following cerebral infarction affecting left non-dominant side: Secondary | ICD-10-CM | POA: Diagnosis not present

## 2017-09-15 ENCOUNTER — Ambulatory Visit: Payer: Medicare Other

## 2017-09-15 ENCOUNTER — Inpatient Hospital Stay: Payer: Medicare Other | Admitting: Oncology

## 2017-09-15 ENCOUNTER — Encounter: Payer: Self-pay | Admitting: Oncology

## 2017-09-16 DIAGNOSIS — I69354 Hemiplegia and hemiparesis following cerebral infarction affecting left non-dominant side: Secondary | ICD-10-CM | POA: Diagnosis not present

## 2017-09-16 DIAGNOSIS — I2510 Atherosclerotic heart disease of native coronary artery without angina pectoris: Secondary | ICD-10-CM | POA: Diagnosis not present

## 2017-09-16 DIAGNOSIS — I480 Paroxysmal atrial fibrillation: Secondary | ICD-10-CM | POA: Diagnosis not present

## 2017-09-16 DIAGNOSIS — I5032 Chronic diastolic (congestive) heart failure: Secondary | ICD-10-CM | POA: Diagnosis not present

## 2017-09-16 DIAGNOSIS — E1140 Type 2 diabetes mellitus with diabetic neuropathy, unspecified: Secondary | ICD-10-CM | POA: Diagnosis not present

## 2017-09-16 DIAGNOSIS — I110 Hypertensive heart disease with heart failure: Secondary | ICD-10-CM | POA: Diagnosis not present

## 2017-09-17 ENCOUNTER — Other Ambulatory Visit: Payer: Self-pay | Admitting: *Deleted

## 2017-09-17 NOTE — Patient Outreach (Signed)
[facility]) Care Management  09/17/2017  Samantha Aguirre Feb 28, 1945 [id number]   Telephone follow up call  Successful outreach call to patient , HIPAA verified x 2 identifiers.  Patient reports that she fairly well.  She report having a recent fall when her knee gave out while getting up to walk to the bathroom at night , states she was reaching for her walker.  Patient discussed that she has contacted PCP office and an appointment on tomorrow to exam her knee.  Patient discussed she also hope to be able to get paperwork completed to get her a scooter, she had one a few years ago. She states that will help her in getting around apartment, complex an to the store. Patient discussed that she has not receive a letter yet from [facility] regarding personal care services, but someone called her and told her the paperwork had been sent in again.   Patient discussed that she has not heard from cardiology office regarding the watchman procedure whether that is still being considered.   Patient reports that she is still followed by home health PT services.  Patient denies any new stroke symptoms when reviewed. Discussed with patient appointment with Dr.Sethi neurologist on next week, she reports she plan to cancel that appointment as she does not plan to return to [facility] , but will plan to discuss with PCP on tomorrow  regarding seeing  neurologist in [place] she has seen in the past. Discussed with patient the importance of follow up .    Plan Placed call to Dr. Clayborn Bigness office received a return call from office Nurse Torris that she discussed with Dr.Callowood and patient would have to be evaluated by EP for consideration of device and she will call patient and discuss. Will plan home visit in the next 2 weeks.   [name], RN, [place] Management Coordinator  [phone]- Mobile [phone]- Toll Free Main Office

## 2017-09-18 DIAGNOSIS — M25562 Pain in left knee: Secondary | ICD-10-CM | POA: Diagnosis not present

## 2017-09-21 DIAGNOSIS — E1140 Type 2 diabetes mellitus with diabetic neuropathy, unspecified: Secondary | ICD-10-CM | POA: Diagnosis not present

## 2017-09-21 DIAGNOSIS — I480 Paroxysmal atrial fibrillation: Secondary | ICD-10-CM | POA: Diagnosis not present

## 2017-09-21 DIAGNOSIS — I5032 Chronic diastolic (congestive) heart failure: Secondary | ICD-10-CM | POA: Diagnosis not present

## 2017-09-21 DIAGNOSIS — I110 Hypertensive heart disease with heart failure: Secondary | ICD-10-CM | POA: Diagnosis not present

## 2017-09-21 DIAGNOSIS — I69354 Hemiplegia and hemiparesis following cerebral infarction affecting left non-dominant side: Secondary | ICD-10-CM | POA: Diagnosis not present

## 2017-09-21 DIAGNOSIS — I2510 Atherosclerotic heart disease of native coronary artery without angina pectoris: Secondary | ICD-10-CM | POA: Diagnosis not present

## 2017-09-22 ENCOUNTER — Encounter (HOSPITAL_COMMUNITY): Payer: Self-pay | Admitting: Internal Medicine

## 2017-09-22 ENCOUNTER — Telehealth: Payer: Self-pay | Admitting: Gastroenterology

## 2017-09-22 ENCOUNTER — Emergency Department (HOSPITAL_COMMUNITY): Payer: Medicare Other

## 2017-09-22 ENCOUNTER — Observation Stay (HOSPITAL_COMMUNITY)
Admission: EM | Admit: 2017-09-22 | Discharge: 2017-09-24 | Disposition: A | Discharge status: Home / Self Care | Payer: Medicare Other | Attending: Internal Medicine | Admitting: Internal Medicine

## 2017-09-22 ENCOUNTER — Ambulatory Visit: Payer: Medicare Other | Admitting: Neurology

## 2017-09-22 DIAGNOSIS — J45909 Unspecified asthma, uncomplicated: Secondary | ICD-10-CM | POA: Diagnosis not present

## 2017-09-22 DIAGNOSIS — Z859 Personal history of malignant neoplasm, unspecified: Secondary | ICD-10-CM | POA: Insufficient documentation

## 2017-09-22 DIAGNOSIS — F17210 Nicotine dependence, cigarettes, uncomplicated: Secondary | ICD-10-CM | POA: Insufficient documentation

## 2017-09-22 DIAGNOSIS — E1140 Type 2 diabetes mellitus with diabetic neuropathy, unspecified: Secondary | ICD-10-CM | POA: Diagnosis not present

## 2017-09-22 DIAGNOSIS — I5030 Unspecified diastolic (congestive) heart failure: Secondary | ICD-10-CM | POA: Insufficient documentation

## 2017-09-22 DIAGNOSIS — J449 Chronic obstructive pulmonary disease, unspecified: Secondary | ICD-10-CM | POA: Diagnosis present

## 2017-09-22 DIAGNOSIS — Z8673 Personal history of transient ischemic attack (TIA), and cerebral infarction without residual deficits: Secondary | ICD-10-CM | POA: Diagnosis not present

## 2017-09-22 DIAGNOSIS — R29818 Other symptoms and signs involving the nervous system: Secondary | ICD-10-CM | POA: Diagnosis not present

## 2017-09-22 DIAGNOSIS — R4701 Aphasia: Secondary | ICD-10-CM | POA: Insufficient documentation

## 2017-09-22 DIAGNOSIS — I639 Cerebral infarction, unspecified: Secondary | ICD-10-CM | POA: Diagnosis present

## 2017-09-22 DIAGNOSIS — E119 Type 2 diabetes mellitus without complications: Secondary | ICD-10-CM | POA: Insufficient documentation

## 2017-09-22 DIAGNOSIS — E038 Other specified hypothyroidism: Secondary | ICD-10-CM

## 2017-09-22 DIAGNOSIS — I679 Cerebrovascular disease, unspecified: Secondary | ICD-10-CM | POA: Diagnosis not present

## 2017-09-22 DIAGNOSIS — G459 Transient cerebral ischemic attack, unspecified: Secondary | ICD-10-CM | POA: Diagnosis not present

## 2017-09-22 DIAGNOSIS — I110 Hypertensive heart disease with heart failure: Secondary | ICD-10-CM | POA: Insufficient documentation

## 2017-09-22 DIAGNOSIS — R531 Weakness: Secondary | ICD-10-CM

## 2017-09-22 DIAGNOSIS — E1169 Type 2 diabetes mellitus with other specified complication: Secondary | ICD-10-CM | POA: Diagnosis present

## 2017-09-22 DIAGNOSIS — G40909 Epilepsy, unspecified, not intractable, without status epilepticus: Secondary | ICD-10-CM

## 2017-09-22 DIAGNOSIS — K922 Gastrointestinal hemorrhage, unspecified: Secondary | ICD-10-CM | POA: Diagnosis present

## 2017-09-22 DIAGNOSIS — Z79899 Other long term (current) drug therapy: Secondary | ICD-10-CM | POA: Insufficient documentation

## 2017-09-22 DIAGNOSIS — R569 Unspecified convulsions: Secondary | ICD-10-CM

## 2017-09-22 DIAGNOSIS — I480 Paroxysmal atrial fibrillation: Secondary | ICD-10-CM | POA: Diagnosis not present

## 2017-09-22 DIAGNOSIS — I2510 Atherosclerotic heart disease of native coronary artery without angina pectoris: Secondary | ICD-10-CM | POA: Diagnosis not present

## 2017-09-22 DIAGNOSIS — Z7982 Long term (current) use of aspirin: Secondary | ICD-10-CM | POA: Insufficient documentation

## 2017-09-22 LAB — I-STAT CHEM 8, ED
BUN: 8 mg/dL (ref 8–23)
CREATININE: 0.6 mg/dL (ref 0.44–1.00)
Calcium, Ion: 1.12 mmol/L — ABNORMAL LOW (ref 1.15–1.40)
Chloride: 107 mmol/L (ref 98–111)
Glucose, Bld: 158 mg/dL — ABNORMAL HIGH (ref 70–99)
HEMATOCRIT: 35 % — AB (ref 36.0–46.0)
HEMOGLOBIN: 11.9 g/dL — AB (ref 12.0–15.0)
POTASSIUM: 3.3 mmol/L — AB (ref 3.5–5.1)
SODIUM: 139 mmol/L (ref 135–145)
TCO2: 20 mmol/L — AB (ref 22–32)

## 2017-09-22 LAB — URINALYSIS, ROUTINE W REFLEX MICROSCOPIC
Bilirubin Urine: NEGATIVE
GLUCOSE, UA: NEGATIVE mg/dL
Hgb urine dipstick: NEGATIVE
Ketones, ur: NEGATIVE mg/dL
LEUKOCYTES UA: NEGATIVE
NITRITE: NEGATIVE
PH: 5 (ref 5.0–8.0)
Protein, ur: NEGATIVE mg/dL
SPECIFIC GRAVITY, URINE: 1.031 — AB (ref 1.005–1.030)

## 2017-09-22 LAB — COMPREHENSIVE METABOLIC PANEL
ALBUMIN: 3.4 g/dL — AB (ref 3.5–5.0)
ALT: 15 U/L (ref 0–44)
AST: 18 U/L (ref 15–41)
Alkaline Phosphatase: 106 U/L (ref 38–126)
Anion gap: 9 (ref 5–15)
BILIRUBIN TOTAL: 0.2 mg/dL — AB (ref 0.3–1.2)
BUN: 10 mg/dL (ref 8–23)
CALCIUM: 9 mg/dL (ref 8.9–10.3)
CO2: 22 mmol/L (ref 22–32)
Chloride: 109 mmol/L (ref 98–111)
Creatinine, Ser: 0.65 mg/dL (ref 0.44–1.00)
GFR calc Af Amer: >60 mL/min (ref >60)
GFR calc non Af Amer: >60 mL/min (ref >60)
GLUCOSE: 167 mg/dL — AB (ref 70–99)
POTASSIUM: 3.3 mmol/L — AB (ref 3.5–5.1)
Sodium: 140 mmol/L (ref 135–145)
TOTAL PROTEIN: 6 g/dL — AB (ref 6.5–8.1)

## 2017-09-22 LAB — RAPID URINE DRUG SCREEN, HOSP PERFORMED
AMPHETAMINES: NOT DETECTED
Barbiturates: NOT DETECTED
Benzodiazepines: NOT DETECTED
COCAINE: NOT DETECTED
OPIATES: NOT DETECTED
TETRAHYDROCANNABINOL: NOT DETECTED

## 2017-09-22 LAB — DIFFERENTIAL
ABS IMMATURE GRANULOCYTES: 0 10*3/uL (ref 0.0–0.1)
BASOS ABS: 0.1 10*3/uL (ref 0.0–0.1)
BASOS PCT: 1 %
Eosinophils Absolute: 0.1 10*3/uL (ref 0.0–0.7)
Eosinophils Relative: 2 %
IMMATURE GRANULOCYTES: 0 %
Lymphocytes Relative: 29 %
Lymphs Abs: 2.7 10*3/uL (ref 0.7–4.0)
MONOS PCT: 8 %
Monocytes Absolute: 0.7 10*3/uL (ref 0.1–1.0)
NEUTROS ABS: 5.9 10*3/uL (ref 1.7–7.7)
Neutrophils Relative %: 60 %

## 2017-09-22 LAB — CBG MONITORING, ED: Glucose-Capillary: 170 mg/dL — ABNORMAL HIGH (ref 70–99)

## 2017-09-22 LAB — ETHANOL

## 2017-09-22 LAB — CBC
HCT: 37.3 % (ref 36.0–46.0)
HEMOGLOBIN: 11.3 g/dL — AB (ref 12.0–15.0)
MCH: 26.8 pg (ref 26.0–34.0)
MCHC: 30.3 g/dL (ref 30.0–36.0)
MCV: 88.4 fL (ref 78.0–100.0)
Platelets: 315 10*3/uL (ref 150–400)
RBC: 4.22 MIL/uL (ref 3.87–5.11)
RDW: 18.2 % — ABNORMAL HIGH (ref 11.5–15.5)
WBC: 9.6 10*3/uL (ref 4.0–10.5)

## 2017-09-22 LAB — PROTIME-INR
INR: 1
Prothrombin Time: 13.1 seconds (ref 11.4–15.2)

## 2017-09-22 LAB — GLUCOSE, CAPILLARY: GLUCOSE-CAPILLARY: 93 mg/dL (ref 70–99)

## 2017-09-22 LAB — I-STAT TROPONIN, ED: Troponin i, poc: 0.01 ng/mL (ref 0.00–0.08)

## 2017-09-22 LAB — APTT: aPTT: 28 seconds (ref 24–36)

## 2017-09-22 MED ORDER — ATORVASTATIN CALCIUM 20 MG PO TABS: 80.0000 mg | ORAL_TABLET | Freq: Every day | ORAL | Status: DC

## 2017-09-22 MED ORDER — LEVETIRACETAM 500 MG PO TABS
500.0000 mg | ORAL_TABLET | Freq: Two times a day (BID) | ORAL | Status: DC
  Administered 2017-09-23 – 2017-09-24 (×4): 500 mg via ORAL
  Filled 2017-09-22 (×4): qty 1

## 2017-09-22 MED ORDER — ACETAMINOPHEN 325 MG PO TABS: 650.0000 mg | ORAL_TABLET | ORAL | Status: DC | PRN

## 2017-09-22 MED ORDER — FENTANYL CITRATE (PF) 100 MCG/2ML IJ SOLN
25.0000 ug | Freq: Once | INTRAMUSCULAR | Status: AC
  Administered 2017-09-22: 25 ug via INTRAVENOUS
  Filled 2017-09-22: qty 2

## 2017-09-22 MED ORDER — SUCRALFATE 1 G PO TABS
1.0000 g | ORAL_TABLET | Freq: Three times a day (TID) | ORAL | Status: DC
  Administered 2017-09-23 – 2017-09-24 (×7): 1 g via ORAL
  Filled 2017-09-22 (×7): qty 1

## 2017-09-22 MED ORDER — STROKE: EARLY STAGES OF RECOVERY BOOK
Freq: Once | Status: AC
  Administered 2017-09-23: 04:00:00
  Filled 2017-09-22: qty 1

## 2017-09-22 MED ORDER — IOPAMIDOL (ISOVUE-370) INJECTION 76%
50.0000 mL | Freq: Once | INTRAVENOUS | Status: AC | PRN
  Administered 2017-09-22: 50 mL via INTRAVENOUS

## 2017-09-22 MED ORDER — GLUCERNA SHAKE PO LIQD
237.0000 mL | Freq: Two times a day (BID) | ORAL | Status: DC
  Administered 2017-09-23 – 2017-09-24 (×3): 237 mL via ORAL

## 2017-09-22 MED ORDER — DIPHENHYDRAMINE HCL 50 MG/ML IJ SOLN
25.0000 mg | Freq: Once | INTRAMUSCULAR | Status: AC
  Administered 2017-09-22: 25 mg via INTRAVENOUS
  Filled 2017-09-22: qty 1

## 2017-09-22 MED ORDER — ALBUTEROL SULFATE (2.5 MG/3ML) 0.083% IN NEBU: 3.0000 mL | INHALATION_SOLUTION | Freq: Four times a day (QID) | RESPIRATORY_TRACT | Status: DC | PRN

## 2017-09-22 MED ORDER — ASPIRIN 325 MG PO TABS
325.0000 mg | ORAL_TABLET | Freq: Every day | ORAL | Status: DC
  Administered 2017-09-23 – 2017-09-24 (×2): 325 mg via ORAL
  Filled 2017-09-22 (×2): qty 1

## 2017-09-22 MED ORDER — LEVOTHYROXINE SODIUM 112 MCG PO TABS
112.0000 ug | ORAL_TABLET | Freq: Every day | ORAL | Status: DC
  Administered 2017-09-23 – 2017-09-24 (×2): 112 ug via ORAL
  Filled 2017-09-22 (×2): qty 1

## 2017-09-22 MED ORDER — FERROUS SULFATE 325 (65 FE) MG PO TABS
325.0000 mg | ORAL_TABLET | Freq: Every day | ORAL | Status: DC
  Administered 2017-09-23 – 2017-09-24 (×2): 325 mg via ORAL
  Filled 2017-09-22 (×2): qty 1

## 2017-09-22 MED ORDER — ACETAMINOPHEN 160 MG/5ML PO SOLN: 650.0000 mg | ORAL | Status: DC | PRN

## 2017-09-22 MED ORDER — HEPARIN SODIUM (PORCINE) 5000 UNIT/ML IJ SOLN
5000.0000 [IU] | Freq: Three times a day (TID) | INTRAMUSCULAR | Status: DC
  Administered 2017-09-23 – 2017-09-24 (×5): 5000 [IU] via SUBCUTANEOUS
  Filled 2017-09-22 (×5): qty 1

## 2017-09-22 MED ORDER — LISINOPRIL 20 MG PO TABS
20.0000 mg | ORAL_TABLET | Freq: Every day | ORAL | Status: DC
  Administered 2017-09-23: 20 mg via ORAL
  Filled 2017-09-22: qty 1

## 2017-09-22 MED ORDER — NITROGLYCERIN 0.4 MG SL SUBL: 0.4000 mg | SUBLINGUAL_TABLET | SUBLINGUAL | Status: DC | PRN

## 2017-09-22 MED ORDER — INSULIN ASPART 100 UNIT/ML ~~LOC~~ SOLN
0.0000 [IU] | Freq: Three times a day (TID) | SUBCUTANEOUS | Status: DC
  Administered 2017-09-23 – 2017-09-24 (×5): 1 [IU] via SUBCUTANEOUS

## 2017-09-22 MED ORDER — LORAZEPAM 2 MG/ML IJ SOLN
1.0000 mg | Freq: Once | INTRAMUSCULAR | Status: AC
  Administered 2017-09-22: 1 mg via INTRAVENOUS
  Filled 2017-09-22: qty 1

## 2017-09-22 MED ORDER — PANTOPRAZOLE SODIUM 40 MG PO TBEC
40.0000 mg | DELAYED_RELEASE_TABLET | Freq: Two times a day (BID) | ORAL | Status: DC
  Administered 2017-09-23 – 2017-09-24 (×4): 40 mg via ORAL
  Filled 2017-09-22 (×4): qty 1

## 2017-09-22 MED ORDER — ATORVASTATIN CALCIUM 80 MG PO TABS
80.0000 mg | ORAL_TABLET | Freq: Every day | ORAL | Status: DC
  Administered 2017-09-23: 80 mg via ORAL
  Filled 2017-09-22: qty 1

## 2017-09-22 MED ORDER — FLUTICASONE PROPIONATE 50 MCG/ACT NA SUSP
1.0000 | Freq: Every day | NASAL | Status: DC
  Administered 2017-09-23 – 2017-09-24 (×2): 1 via NASAL
  Filled 2017-09-22: qty 16

## 2017-09-22 MED ORDER — ACETAMINOPHEN 650 MG RE SUPP: 650.0000 mg | RECTAL | Status: DC | PRN

## 2017-09-22 MED ORDER — DULOXETINE HCL 60 MG PO CPEP
60.0000 mg | ORAL_CAPSULE | Freq: Every day | ORAL | Status: DC
  Administered 2017-09-23 (×2): 60 mg via ORAL
  Filled 2017-09-22 (×2): qty 1

## 2017-09-22 MED ORDER — GABAPENTIN 400 MG PO CAPS
800.0000 mg | ORAL_CAPSULE | Freq: Two times a day (BID) | ORAL | Status: DC
  Administered 2017-09-23 – 2017-09-24 (×4): 800 mg via ORAL
  Filled 2017-09-22 (×4): qty 2

## 2017-09-22 MED ORDER — VITAMIN B-12 1000 MCG PO TABS
1000.0000 ug | ORAL_TABLET | Freq: Every day | ORAL | Status: DC
  Administered 2017-09-23 – 2017-09-24 (×2): 1000 ug via ORAL
  Filled 2017-09-22 (×2): qty 1

## 2017-09-22 MED ORDER — UMECLIDINIUM BROMIDE 62.5 MCG/INH IN AEPB
1.0000 | INHALATION_SPRAY | Freq: Every day | RESPIRATORY_TRACT | Status: DC
  Administered 2017-09-24: 1 via RESPIRATORY_TRACT
  Filled 2017-09-22: qty 7

## 2017-09-22 NOTE — Telephone Encounter (Signed)
Pt left vm she is having lose stools and is worried about her bowel habits please call pt

## 2017-09-22 NOTE — ED Notes (Signed)
Benadryl not given at this time, pt states she already received it prior to her CTA. CTA has already been completed, so primary RN will be notified.

## 2017-09-22 NOTE — ED Triage Notes (Addendum)
Patient called EMS for left lower leg pain that radiates to there left knee and was on her way to [facility] when she developed stroke like symptoms. Left sided facial droop developed en route to the hospital and patient came to [facility] instead of [facility]. LKW 1345. Hx CVA in July 2019 with left sided weakness deficits. VSS.

## 2017-09-22 NOTE — Consult Note (Addendum)
NEURO HOSPITALIST      Requesting Physician: Dr. Sedonia Small    Chief Complaint: left facial droop  History obtained from:  Patient    HPI:                                                                                                                                         Samantha Aguirre is an 72 y.o. female  PMH of multiple CVA's ( left side residual weakness and dysarthria), HTN, HLD, DM, a. Fib ( not anticoagulated d/t GI bleed 06/2017) presented to [facility] as a code stroke for acute onset of left facial droop and left side weakness.   Per patient she was in route via EMS to [facility] for left ankle pain. While in the ambulance she had an acute onset of left facial droop and increased left side weakness. EMS activated a code stroke and brought her to [facility] ED. Patient had her last stroke in July 2019 and received TPA at that time. She is no longer on eliquis d/T GI bleed.  She was recommended to go to [facility] or [facility] for [place] device, given that she can not be anticoagulated as above.  ED course:  BG: 180, BG: 93/59 CT : Head negative.  07/30/17 received TPA for left internal capsule CVA. Has old Left MCA infarct, multiple old cerebellar 04/06/17 Old R thalamus ( with residual left side deficits)  Date last known well: Date: 09/22/2017 Time last known well: Time: 13:45 tPA Given: No: contraindicated; too mild to treat and recent stroke Modified Rankin: Rankin Score=2 NIHSS:5   Past Medical History:  Diagnosis Date  . Acid reflux   . Arthritis    Bilateral knees, hands, feet  . Asthma   . Atrial fibrillation ([place])   . Cancer ([place])   . CHF (congestive heart failure) (HCC)    Diastolic CHF  . COPD (chronic obstructive pulmonary disease) ([place])   . Coronary artery disease   . Diabetes mellitus without complication ([place])   . Frequent headaches   . GI bleed   . HLD (hyperlipidemia)   . Hypertension   . Seizures ([place])   . Skin cancer 2015    Suspected basal cell carcinoma on nose, surgically removed  . Stroke ([place]) 04/2017  . Thyroid disease   . Tremors of nervous system     Past Surgical History:  Procedure Laterality Date  . ABDOMINAL HYSTERECTOMY  1976  . APPENDECTOMY  1980  . CAROTID ENDARTERECTOMY    . CHOLECYSTECTOMY  1980  . COLONOSCOPY N/A 02/20/2017   Procedure: COLONOSCOPY;  Surgeon: Lin Landsman, MD;  Location: [facility] ENDOSCOPY;  Service: Gastroenterology;  Laterality: N/A;  . ESOPHAGOGASTRODUODENOSCOPY N/A 02/20/2017   Procedure: ESOPHAGOGASTRODUODENOSCOPY (EGD);  Surgeon: Lin Landsman, MD;  Location: [facility] ENDOSCOPY;  Service: Gastroenterology;  Laterality: N/A;  . ESOPHAGOGASTRODUODENOSCOPY (EGD) WITH PROPOFOL N/A 07/08/2017   Procedure: ESOPHAGOGASTRODUODENOSCOPY (EGD) WITH PROPOFOL;  Surgeon: [name], MD;  Location: [facility] ENDOSCOPY;  Service: Endoscopy;  Laterality: N/A;  . [place]  . GIVENS CAPSULE STUDY N/A 07/09/2017   Procedure: GIVENS CAPSULE STUDY;  Surgeon: Jonathon Bellows, MD;  Location[facility] ENDOSCOPY;  Service: Gastroenterology;  Laterality: N/A;  . KNEE SURGERY     left  . ROTATOR CUFF REPAIR     right  . TONSILLECTOMY  1950    Family History  Problem Relation Age of Onset  . Breast cancer Mother   . Heart disease Mother   . Stroke Mother   . Cancer Mother   . COPD Mother   . Diabetes Mother   . Heart disease Father   . Diabetes Father   . Stroke Father   . Alcohol abuse Sister   . Drug abuse Sister   . Stroke Sister   . Cancer Sister   . Mental illness Sister   . Heart disease Brother   . Arthritis Brother   . Diabetes Brother   . Heart disease Maternal Grandfather   . Heart disease Paternal Grandfather        Social History:  reports that she has been smoking cigarettes. She has a 53.00 pack-year smoking history. She has never used smokeless tobacco. She reports that she does not drink alcohol or use drugs.  Allergies:  Allergies  Allergen Reactions   . Methotrexate Rash  . Mushroom Extract Complex Anaphylaxis    Made patient "deathly sick"  . Atenolol     "MD took me off of it bc it was doing something wrong" Made patient HYPOTENSIVE  . Ivp Dye [Iodinated Diagnostic Agents] Itching    Pt injected with IV contrast.  5 min after injection pt complained of itching behind ear and on her abd.  . Morphine And Related Other (See Comments)    "stops my breathing" NEAR-RESPIRATORY ARREST  . Oxycodone Hcl   . Percocet [Oxycodone-Acetaminophen] Nausea And Vomiting and Other (See Comments)    Stomach pains  . Percodan [Oxycodone-Aspirin] Nausea And Vomiting and Other (See Comments)    Stomach pains  . Verapamil Other (See Comments)    " MD told me this was screwing me up" MAKES PATIENT HYPOTENSIVE    Medications:                                                                                                                           Current Facility-Administered Medications  Medication Dose Route Frequency Provider Last Rate Last Dose  . iopamidol (ISOVUE-370) 76 % injection 50 mL  50 mL Intravenous Once PRN Amie Portland, MD       Current Outpatient Medications  Medication Sig Dispense Refill  .  albuterol (PROVENTIL HFA;VENTOLIN HFA) 108 (90 Base) MCG/ACT inhaler Inhale 2 puffs into the lungs every 6 (six) hours as needed for wheezing or shortness of breath. 3 Inhaler 1  . aspirin 325 MG tablet Take 1 tablet (325 mg total) by mouth daily. 30 tablet 0  . atorvastatin (LIPITOR) 80 MG tablet Take 1 tablet (80 mg total) by mouth daily at 6 PM. 90 tablet 3  . blood glucose meter kit and supplies Dispense based on patient and insurance preference. Use up to four times daily as directed. (FOR ICD-10 E10.9, E11.9). 1 each 0  . diclofenac sodium (VOLTAREN) 1 % GEL Apply 2 g topically 4 (four) times daily. (Patient taking differently: Apply 2 g topically daily as needed. ) 1 Tube 1  . DULoxetine (CYMBALTA) 60 MG capsule Take 1 capsule (60 mg  total) by mouth at bedtime. 30 capsule 11  . feeding supplement, GLUCERNA SHAKE, (GLUCERNA SHAKE) LIQD Take 237 mLs by mouth 2 (two) times daily between meals. 30 Can 0  . ferrous sulfate 325 (65 FE) MG EC tablet Take 1 tablet (325 mg total) by mouth 2 (two) times daily with a meal. 60 tablet 0  . fluticasone (FLONASE) 50 MCG/ACT nasal spray Place 1 spray into both nostrils daily. 48 g 3  . furosemide (LASIX) 20 MG tablet Take 20 mg by mouth daily.  30 tablet 3  . gabapentin (NEURONTIN) 800 MG tablet Take 1 tablet (800 mg total) by mouth 2 (two) times daily. 180 tablet 1  . glipiZIDE-metformin (METAGLIP) 5-500 MG tablet Take 1 tablet by mouth 2 (two) times daily before a meal. 180 tablet 1  . levETIRAcetam (KEPPRA) 500 MG tablet Take 1 tablet (500 mg total) by mouth 2 (two) times daily. 60 tablet 0  . levothyroxine (SYNTHROID, LEVOTHROID) 175 MCG tablet Take 1 tablet (175 mcg total) by mouth daily before breakfast. (Patient taking differently: Take 112 mcg by mouth daily before breakfast. ) 90 tablet 3  . lisinopril (PRINIVIL,ZESTRIL) 20 MG tablet Take 20 mg by mouth daily.  3  . nitroGLYCERIN (NITROSTAT) 0.4 MG SL tablet Place 1 tablet (0.4 mg total) under the tongue every 5 (five) minutes as needed for chest pain. 30 tablet 0  . pantoprazole (PROTONIX) 40 MG tablet Take 1 tablet (40 mg total) by mouth 2 (two) times daily. (Patient not taking: Reported on 08/13/2017) 60 tablet 0  . potassium chloride (MICRO-K) 10 MEQ CR capsule Take 1 capsule (10 mEq total) by mouth daily. (Patient taking differently: Take 20 mEq by mouth 2 (two) times daily. ) 90 capsule 3  . sucralfate (CARAFATE) 1 g tablet TAKE (1) TABLET BY MOUTH FOUR TIMES A DAY 360 tablet 1  . umeclidinium bromide (INCRUSE ELLIPTA) 62.5 MCG/INH AEPB Inhale 1 puff into the lungs daily. 1 each 11  . vitamin B-12 (CYANOCOBALAMIN) 1000 MCG tablet Take 1 tablet by mouth daily.  1     ROS:  History obtained from the patient  General ROS: negative for - chills, fatigue, fever, night sweats, weight gain or weight loss Ophthalmic ROS: negative for- diplopia, blurry vision, eye pain   Respiratory ROS: negative for - cough,  shortness of breath or wheezing Cardiovascular ROS: negative for - chest pain, dyspnea on exertion,  Musculoskeletal ROS: positive for - joint swelling and left knee pain and left side muscular weakness Neurological ROS: as noted in HPI   General Examination:                                                                                                      There were no vitals taken for this visit.  HEENT-  Normocephalic, no lesions, without obvious abnormality.  Normal external eye and conjunctiva.  Cardiovascular- S1-S2 audible, pulses palpable throughout   Lungs-no rhonchi or wheezing noted, no excessive working breathing.  Saturations within normal limits on RA Extremities- Warm, dry and intact Musculoskeletal- left knee tender and  Slightly edematous Skin-warm and dry, no hyperpigmentation, vitiligo, or suspicious lesions  Neurological Examination Mental Status: Alert, oriented, thought content appropriate.  Speech fluent without evidence of aphasia.  Able to follow  commands without difficulty. Cranial Nerves: II: ; Visual fields grossly normal,  III,IV, VI: ptosis not present, extra-ocular motions intact bilaterally, pupils equal, round, reactive to light and accommodation V,VII: smile asymmetric left facial droop,  facial light touch sensation intact, left face is numb and has less sensation than right side of face.  VIII: hearing normal bilaterally IX,X: uvula rises symmetrically XI: bilateral shoulder shrug XII: midline tongue extension Motor: Right : Upper extremity   5/5    Left:     Upper extremity   4-/5  Lower extremity   5/5     Lower extremity   4+/5 Tone and  bulk:normal tone throughout; no atrophy noted Tremor of left arm noted when arms are held in air greater than 5 seconds. Sensory:  light touch intact throughout, bilaterally Deep Tendon Reflexes: 2+ and symmetric biceps , 2+ right patella, UTA right patella d/t knee pain  Plantars: Right: downgoing   Left: downgoing Cerebellar:  normal finger-to-nose, normal heel-to-shin test Gait: deferred   Lab Results: Basic Metabolic Panel: Recent Labs  Lab 09/22/17 1431  NA 139  K 3.3*  CL 107  GLUCOSE 158*  BUN 8  CREATININE 0.60    CBC: Recent Labs  Lab 09/22/17 1430 09/22/17 1431  WBC 9.6  --   NEUTROABS 5.9  --   HGB 11.3* 11.9*  HCT 37.3 35.0*  MCV 88.4  --   PLT 315  --     CBG: Recent Labs  Lab 09/22/17 1417  GLUCAP 170*    Imaging: Ct Head Code Stroke Wo Contrast  Result Date: 09/22/2017 CLINICAL DATA:  Code stroke.  Left leg weakness EXAM: CT HEAD WITHOUT CONTRAST TECHNIQUE: Contiguous axial images were obtained from the base of the skull through the vertex without intravenous contrast. COMPARISON:  08/06/2017 FINDINGS: Brain: Moderate remote infarct in the left cerebrum along the parietal lobe reaching into the occipital  cortex. Small remote bilateral cerebellar infarcts. Remote lacunar infarct in the right caudate head. Ischemic gliosis in the cerebral white matter. No hemorrhage, hydrocephalus, or intra-axial masslike finding. There is a partially calcified extra-axial mass the high right frontal convexity measuring 1 cm. Vascular: No hyperdense vessel. Skull: No acute finding Sinuses/Orbits: Negative Other: These results were communicated to Dr. Rory Percy at 2:30 pmon 9/10/2019by text page via the [facility] messaging system. ASPECTS [facility] Stroke Program Early CT Score) -right hemisphere - Ganglionic level infarction (caudate, lentiform nuclei, internal capsule, insula, M1-M3 cortex): 7 - Supraganglionic infarction (M4-M6 cortex): 3 Total score (0-10 with 10 being normal): 10  IMPRESSION: 1. No acute finding.  ASPECTS is 10 in the right hemisphere. 2. Moderate remote left parietooccipital infarct. Remote small vessel infarcts in the infra and supratentorial brain. 3. 1 cm calcified meningioma along the right frontal convexity. Electronically Signed   By: Monte Fantasia M.D.   On: 09/22/2017 14:32   CTA H+N-negative for emergent LVO, atherosclerotic disease in the aortic arch and carotid bifurcation without significant stenosis.  Moderate stenosis of the origin of both vertebral arteries due to calcification. Enhancing mass in the left cavernous sinus-likely a meningioma that is stable from prior study.    Laurey Morale, MSN, NP-C [facility] [phone]  09/22/2017, 2:24 PM   Attending physician note to follow with Assessment and plan .   Attending addendum Patient seen and examined as an acute code stroke. Agree with the history and physical documented above. NIH 5 I have reviewed imaging personally. CT-no acute changes, multiple old strokes.  Aspects 10. CT Angie head and neck showed no emergent LVO, atherosclerotic disease of the aortic arch and carotid bifurcation with no significant stenosis, moderate stenosis of origin of both vertebral artery disease or calcification.  Assessment: 72 y.o. female  PMH of multiple CVA's ( left side residual weakness and dysarthria), HTN, HLD, DM, a. Fib ( not anticoagulated d/t GI bleed 06/2017) presented to [facility] as a code stroke for acute onset of left facial droop and left side weakness. CT Head no acute findings. Not given TPA because of mild change in NIHSS and history of GI bleeds. She was supposed to see a specialist over at [facility] or [facility] for [place] device but has not been able to do that still at this point. She likely has a cardio embolic stroke from her non-anticoagulated atrial fibrillation. No evidence of large vessel occlusion on exam or CT angiogram.  Hence not a candidate for EVT.  Impression:  Likely a new embolic stroke. Stroke Risk Factors - atrial fibrillation, diabetes mellitus, family history, hyperlipidemia and hypertension Etiology : further work up needed   Recommendations: -- BP goal : Permissive HTN upto 220/110 mmHg  --MRI Brain  --CTA ( completed) --Echocardiogram --continue ASA 325 mg --continue atorvastatin 80 mg daily  --HgbA1c, fasting lipid panel --PT consult, OT consult, Speech consult --Telemetry monitoring --Frequent neuro checks --Stroke swallow screen  -- Benadryl 25 mg IV  One time dose if needed for itching post CTA contrast --Stroke team to advise on watchman device  --please page stroke NP  Or  PA  Or MD from 8am -4 pm  as this patient from this time will be  followed by the stroke.   You can look them up on [email or web address]  Password [email or web address]  CRITICAL CARE ATTESTATION This patient is critically ill and at significant risk of neurological worsening, death and care requires constant monitoring of vital signs, hemodynamics,respiratory and cardiac monitoring. I spent  45  minutes of neurocritical care time performing neurological assessment, discussion with family, other specialists and medical decision making of high complexityin the care of  this patient.

## 2017-09-22 NOTE — H&P (Signed)
History and Physical    Samantha Aguirre HYI:[id number] DOB: 19-May-1945 DOA: 09/22/2017  PCP: Tracie Harrier, MD  Patient coming from: Home.  Chief Complaint: Left-sided weakness.  HPI: Samantha Aguirre is a 72 y.o. female with history of stroke who has had recent TPA, history of recent GI bleed, A. fib not on anticoagulation secondary recent GI bleeding, diabetes mellitus type 2, seizures hyperlipidemia was on the way to [facility] due to left ankle pain was noticed to have left facial droop and increased left-sided weakness than her baseline by EMS and was brought into the ER at [facility] as code stroke.  Patient's symptoms started from around 1 PM.  ED Course: In the ER patient symptoms have found to be mild.  CT angiogram of the head and neck done did not show any large vessel obstruction.  Neurologist on-call has been consulted and patient admitted for further stroke work-up.  MRI brain is negative.  Patient still has mild weakness of the left lower extremity more than her baseline as per the patient.  Patient's left facial droop has resolved.  Patient's left lower extremity pain is mostly in the knee and below.  No acute ischemic changes.  Review of Systems: As per HPI, rest all negative.   Past Medical History:  Diagnosis Date  . Acid reflux   . Arthritis    Bilateral knees, hands, feet  . Asthma   . Atrial fibrillation ([place])   . Cancer ([place])   . CHF (congestive heart failure) (HCC)    Diastolic CHF  . COPD (chronic obstructive pulmonary disease) ([place])   . Coronary artery disease   . Diabetes mellitus without complication ([place])   . Frequent headaches   . GI bleed   . HLD (hyperlipidemia)   . Hypertension   . Seizures ([place])   . Skin cancer 2015   Suspected basal cell carcinoma on nose, surgically removed  . Stroke ([place]) 04/2017  . Thyroid disease   . Tremors of nervous system     Past Surgical History:  Procedure Laterality Date  .  ABDOMINAL HYSTERECTOMY  1976  . APPENDECTOMY  1980  . CAROTID ENDARTERECTOMY    . CHOLECYSTECTOMY  1980  . COLONOSCOPY N/A 02/20/2017   Procedure: COLONOSCOPY;  Surgeon: Lin Landsman, MD;  Location: [facility] ENDOSCOPY;  Service: Gastroenterology;  Laterality: N/A;  . ESOPHAGOGASTRODUODENOSCOPY N/A 02/20/2017   Procedure: ESOPHAGOGASTRODUODENOSCOPY (EGD);  Surgeon: Lin Landsman, MD;  Location: [facility] ENDOSCOPY;  Service: Gastroenterology;  Laterality: N/A;  . ESOPHAGOGASTRODUODENOSCOPY (EGD) WITH PROPOFOL N/A 07/08/2017   Procedure: ESOPHAGOGASTRODUODENOSCOPY (EGD) WITH PROPOFOL;  Surgeon: [name], MD;  Location: [facility] ENDOSCOPY;  Service: Endoscopy;  Laterality: N/A;  . [place]  . GIVENS CAPSULE STUDY N/A 07/09/2017   Procedure: GIVENS CAPSULE STUDY;  Surgeon: Jonathon Bellows, MD;  Location: [facility] ENDOSCOPY;  Service: Gastroenterology;  Laterality: N/A;  . KNEE SURGERY     left  . ROTATOR CUFF REPAIR     right  . TONSILLECTOMY  1950     reports that she has been smoking cigarettes. She has a 53.00 pack-year smoking history. She has never used smokeless tobacco. She reports that she does not drink alcohol or use drugs.  Allergies  Allergen Reactions  . Methotrexate Rash  . Mushroom Extract Complex Anaphylaxis    Made patient "deathly sick"  . Atenolol     "MD took me off of it bc it was doing something wrong" Made patient HYPOTENSIVE  .  Ivp Dye [Iodinated Diagnostic Agents] Itching    Pt injected with IV contrast.  5 min after injection pt complained of itching behind ear and on her abd.  . Morphine And Related Other (See Comments)    "stops my breathing" NEAR-RESPIRATORY ARREST  . Oxycodone Hcl   . Percocet [Oxycodone-Acetaminophen] Nausea And Vomiting and Other (See Comments)    Stomach pains  . Percodan [Oxycodone-Aspirin] Nausea And Vomiting and Other (See Comments)    Stomach pains  . Verapamil Other (See Comments)    " MD told me this was screwing me  up" MAKES PATIENT HYPOTENSIVE    Family History  Problem Relation Age of Onset  . Breast cancer Mother   . Heart disease Mother   . Stroke Mother   . Cancer Mother   . COPD Mother   . Diabetes Mother   . Heart disease Father   . Diabetes Father   . Stroke Father   . Alcohol abuse Sister   . Drug abuse Sister   . Stroke Sister   . Cancer Sister   . Mental illness Sister   . Heart disease Brother   . Arthritis Brother   . Diabetes Brother   . Heart disease Maternal Grandfather   . Heart disease Paternal Grandfather     Prior to Admission medications   Medication Sig Start Date End Date Taking? Authorizing Provider  albuterol (PROVENTIL HFA;VENTOLIN HFA) 108 (90 Base) MCG/ACT inhaler Inhale 2 puffs into the lungs every 6 (six) hours as needed for wheezing or shortness of breath. 10/08/16   Karamalegos, Devonne Doughty, DO  aspirin 325 MG tablet Take 1 tablet (325 mg total) by mouth daily. 08/03/17   Regalado, Belkys A, MD  atorvastatin (LIPITOR) 80 MG tablet Take 1 tablet (80 mg total) by mouth daily at 6 PM. 09/24/16   Karamalegos, Devonne Doughty, DO  blood glucose meter kit and supplies Dispense based on patient and insurance preference. Use up to four times daily as directed. (FOR ICD-10 E10.9, E11.9). 08/03/17   Regalado, Belkys A, MD  diclofenac sodium (VOLTAREN) 1 % GEL Apply 2 g topically 4 (four) times daily. Patient taking differently: Apply 2 g topically daily as needed.  01/11/16   Daymon Larsen, MD  DULoxetine (CYMBALTA) 60 MG capsule Take 1 capsule (60 mg total) by mouth at bedtime. 09/24/16   Karamalegos, Devonne Doughty, DO  feeding supplement, GLUCERNA SHAKE, (GLUCERNA SHAKE) LIQD Take 237 mLs by mouth 2 (two) times daily between meals. 08/03/17   Regalado, Belkys A, MD  ferrous sulfate 325 (65 FE) MG EC tablet Take 1 tablet (325 mg total) by mouth 2 (two) times daily with a meal. 02/21/17   Sudini, Srikar, MD  fluticasone (FLONASE) 50 MCG/ACT nasal spray Place 1 spray into both  nostrils daily. 10/08/16   Karamalegos, Devonne Doughty, DO  furosemide (LASIX) 20 MG tablet Take 20 mg by mouth daily.     Karamalegos, Alexander J, DO  gabapentin (NEURONTIN) 800 MG tablet Take 1 tablet (800 mg total) by mouth 2 (two) times daily. 09/24/16   Karamalegos, Devonne Doughty, DO  glipiZIDE-metformin (METAGLIP) 5-500 MG tablet Take 1 tablet by mouth 2 (two) times daily before a meal. 04/02/17   Karamalegos, Devonne Doughty, DO  levETIRAcetam (KEPPRA) 500 MG tablet Take 1 tablet (500 mg total) by mouth 2 (two) times daily. 09/19/15   Epifanio Lesches, MD  levothyroxine (SYNTHROID, LEVOTHROID) 175 MCG tablet Take 1 tablet (175 mcg total) by mouth daily before breakfast. Patient  taking differently: Take 112 mcg by mouth daily before breakfast.  09/24/16   Karamalegos, Devonne Doughty, DO  lisinopril (PRINIVIL,ZESTRIL) 20 MG tablet Take 20 mg by mouth daily. 07/16/17   [provider]  nitroGLYCERIN (NITROSTAT) 0.4 MG SL tablet Place 1 tablet (0.4 mg total) under the tongue every 5 (five) minutes as needed for chest pain. 02/28/16   Bettey Costa, MD  pantoprazole (PROTONIX) 40 MG tablet Take 1 tablet (40 mg total) by mouth 2 (two) times daily. Patient not taking: Reported on 08/13/2017 08/03/17   Regalado, Jerald Kief A, MD  potassium chloride (MICRO-K) 10 MEQ CR capsule Take 1 capsule (10 mEq total) by mouth daily. Patient taking differently: Take 20 mEq by mouth 2 (two) times daily.  09/24/16   Karamalegos, Devonne Doughty, DO  sucralfate (CARAFATE) 1 g tablet TAKE (1) TABLET BY MOUTH FOUR TIMES A DAY 03/26/17   Karamalegos, Devonne Doughty, DO  umeclidinium bromide (INCRUSE ELLIPTA) 62.5 MCG/INH AEPB Inhale 1 puff into the lungs daily. 11/13/15   Karamalegos, Devonne Doughty, DO  vitamin B-12 (CYANOCOBALAMIN) 1000 MCG tablet Take 1 tablet by mouth daily. 06/29/17   [provider]    Physical Exam: Vitals:   09/22/17 2030 09/22/17 2045 09/22/17 2100 09/22/17 2115  BP: 106/60 125/65 134/69 (!) 119/59  Pulse:  (!) 57 (!) 51 (!) 50 (!) 55  Resp: _0 Temp:      TempSrc:      SpO2: 97% 100% 98% 99%      Constitutional: Moderately built and nourished. Vitals:   09/22/17 2030 09/22/17 2045 09/22/17 2100 09/22/17 2115  BP: 106/60 125/65 134/69 (!) 119/59  Pulse: (!) 57 (!) 51 (!) 50 (!) 55  Resp: _1 Temp:      TempSrc:      SpO2: 97% 100% 98% 99%   Eyes: Anicteric no pallor. ENMT: No discharge from the ears eyes nose or mouth. Neck: No mass palpated no neck rigidity.  No JVD appreciated. Respiratory: No rhonchi or crepitations. Cardiovascular: S1-S2 heard no murmurs appreciated. Abdomen: Soft nontender bowel sounds present. Musculoskeletal: No edema.  No joint effusion. Skin: No rash. Neurologic: Alert awake oriented to time place and person.  No facial asymmetry.  Tongue is midline.  Pupils equal reacting to light.  Left upper extremity and right upper extremity is 5 x 5.  Left lower extremity is 1 x 5.  Right lower extremities 5 x 5. Psychiatric: Appears normal per normal affect.   Labs on Admission: I have personally reviewed following labs and imaging studies  CBC: Recent Labs  Lab 09/22/17 1430 09/22/17 1431  WBC 9.6  --   NEUTROABS 5.9  --   HGB 11.3* 11.9*  HCT 37.3 35.0*  MCV 88.4  --   PLT 315  --    Basic Metabolic Panel: Recent Labs  Lab 09/22/17 1430 09/22/17 1431  NA 140 139  K 3.3* 3.3*  CL 109 107  CO2 22  --   GLUCOSE 167* 158*  BUN 10 8  CREATININE 0.65 0.60  CALCIUM 9.0  --    GFR: CrCl cannot be calculated (Unknown ideal weight.). Liver Function Tests: Recent Labs  Lab 09/22/17 1430  AST 18  ALT 15  ALKPHOS 106  BILITOT 0.2*  PROT 6.0*  ALBUMIN 3.4*   No results for input(s): LIPASE, AMYLASE in the last 168 hours. No results for input(s): AMMONIA in the last 168 hours. Coagulation Profile: Recent Labs  Lab 09/22/17  1430  INR 1.00   Cardiac Enzymes: No results for input(s): CKTOTAL, CKMB, CKMBINDEX, TROPONINI in  the last 168 hours. BNP (last 3 results) No results for input(s): PROBNP in the last 8760 hours. HbA1C: No results for input(s): HGBA1C in the last 72 hours. CBG: Recent Labs  Lab 09/22/17 1417  GLUCAP 170*   Lipid Profile: No results for input(s): CHOL, HDL, LDLCALC, TRIG, CHOLHDL, LDLDIRECT in the last 72 hours. Thyroid Function Tests: No results for input(s): TSH, T4TOTAL, FREET4, T3FREE, THYROIDAB in the last 72 hours. Anemia Panel: No results for input(s): VITAMINB12, FOLATE, FERRITIN, TIBC, IRON, RETICCTPCT in the last 72 hours. Urine analysis:    Component Value Date/Time   COLORURINE YELLOW 09/22/2017 1940   APPEARANCEUR CLEAR 09/22/2017 1940   LABSPEC 1.031 (H) 09/22/2017 1940   PHURINE 5.0 09/22/2017 1940   GLUCOSEU NEGATIVE 09/22/2017 1940   HGBUR NEGATIVE 09/22/2017 1940   BILIRUBINUR NEGATIVE 09/22/2017 1940   KETONESUR NEGATIVE 09/22/2017 1940   PROTEINUR NEGATIVE 09/22/2017 1940   NITRITE NEGATIVE 09/22/2017 1940   LEUKOCYTESUR NEGATIVE 09/22/2017 1940   Sepsis Labs: _0 (procalcitonin:4,lacticidven:4) )No results found for this or any previous visit (from the past 240 hour(s)).   Radiological Exams on Admission: Ct Angio Head W Or Wo Contrast  Addendum Date: 09/22/2017   ADDENDUM REPORT: 09/22/2017 15:14 ADDENDUM: 7 mm enhancing right parasagittal mass unchanged from prior studies consistent with meningioma. Electronically Signed   By: Franchot Gallo M.D.   On: 09/22/2017 15:14   Result Date: 09/22/2017 CLINICAL DATA:  Left-sided weakness.  Stroke. EXAM: CT ANGIOGRAPHY HEAD AND NECK TECHNIQUE: Multidetector CT imaging of the head and neck was performed using the standard protocol during bolus administration of intravenous contrast. Multiplanar CT image reconstructions and MIPs were obtained to evaluate the vascular anatomy. Carotid stenosis measurements (when applicable) are obtained utilizing NASCET criteria, using the distal internal carotid diameter  as the denominator. CONTRAST:  40m ISOVUE-370 IOPAMIDOL (ISOVUE-370) INJECTION 76% COMPARISON:  Numerous prior imaging studies. CT head today. MRA head 026-Jul-2019 CTA 09/16/2015, MRI head with contrast 04/25/2014 FINDINGS: CTA NECK FINDINGS Aortic arch: Atherosclerotic aortic arch without aneurysm. Atherosclerotic disease proximal great vessels without significant stenosis. Right carotid system: Atherosclerotic calcification in the right common carotid artery. Atherosclerotic plaque at the right carotid bifurcation and proximal right internal carotid artery. Less than 25% diameter stenosis right internal carotid artery Left carotid system: Mild atherosclerotic calcification in the left common carotid artery. Mild atherosclerotic calcification left carotid bifurcation without significant stenosis. Vertebral arteries: Moderate stenosis origin of the vertebral artery bilaterally due to calcific stenosis. Both vertebral arteries are patent to the basilar without additional stenosis. Skeleton: Negative Other neck: Negative for mass or adenopathy. Upper chest: Lung apices are clear aside from a calcified granuloma left upper lobe. Review of the MIP images confirms the above findings CTA HEAD FINDINGS Anterior circulation: Mild atherosclerotic calcification in the cavernous carotid bilaterally without significant stenosis. Anterior and middle cerebral arteries patent bilaterally without stenosis. Enhancing lesion in the left cavernous sinus medial to the cavernous carotid. This shows homogeneous enhancement and has rounded contours measuring approximately 8 x 14 mm. The enhancement is significantly less than arterial enhancement and is slightly greater than venous enhancement. Therefore, this is not felt to be an aneurysm. This has been present on prior studies and showed enhancement on the MRI 2016. This has been followed as a probable meningioma. No change since the prior MRA of 09/16/2015 Posterior circulation: Both  vertebral arteries are widely patent to the  basilar. PICA AICA superior cerebellar and posterior cerebral arteries patent bilaterally. Fetal origin of the posterior cerebral artery bilaterally with relatively small basilar, normal variation. Venous sinuses: Patent Anatomic variants: None Delayed phase: Not performed Review of the MIP images confirms the above findings IMPRESSION: 1. Negative for emergent large vessel occlusion 2. Atherosclerotic disease aortic arch and carotid bifurcation bilaterally without significant stenosis. Moderate stenosis origin of both vertebral artery due to calcific disease. 3. Enhancing mass in the left cavernous sinus is stable from prior studies and may represent a meningioma measuring 8 x 14 mm. Electronically Signed: By: Franchot Gallo M.D. On: 09/22/2017 15:05   Ct Angio Neck W Or Wo Contrast  Addendum Date: 09/22/2017   ADDENDUM REPORT: 09/22/2017 15:14 ADDENDUM: 7 mm enhancing right parasagittal mass unchanged from prior studies consistent with meningioma. Electronically Signed   By: Franchot Gallo M.D.   On: 09/22/2017 15:14   Result Date: 09/22/2017 CLINICAL DATA:  Left-sided weakness.  Stroke. EXAM: CT ANGIOGRAPHY HEAD AND NECK TECHNIQUE: Multidetector CT imaging of the head and neck was performed using the standard protocol during bolus administration of intravenous contrast. Multiplanar CT image reconstructions and MIPs were obtained to evaluate the vascular anatomy. Carotid stenosis measurements (when applicable) are obtained utilizing NASCET criteria, using the distal internal carotid diameter as the denominator. CONTRAST:  79m ISOVUE-370 IOPAMIDOL (ISOVUE-370) INJECTION 76% COMPARISON:  Numerous prior imaging studies. CT head today. MRA head 02019/08/10 CTA 09/16/2015, MRI head with contrast 04/25/2014 FINDINGS: CTA NECK FINDINGS Aortic arch: Atherosclerotic aortic arch without aneurysm. Atherosclerotic disease proximal great vessels without significant stenosis.  Right carotid system: Atherosclerotic calcification in the right common carotid artery. Atherosclerotic plaque at the right carotid bifurcation and proximal right internal carotid artery. Less than 25% diameter stenosis right internal carotid artery Left carotid system: Mild atherosclerotic calcification in the left common carotid artery. Mild atherosclerotic calcification left carotid bifurcation without significant stenosis. Vertebral arteries: Moderate stenosis origin of the vertebral artery bilaterally due to calcific stenosis. Both vertebral arteries are patent to the basilar without additional stenosis. Skeleton: Negative Other neck: Negative for mass or adenopathy. Upper chest: Lung apices are clear aside from a calcified granuloma left upper lobe. Review of the MIP images confirms the above findings CTA HEAD FINDINGS Anterior circulation: Mild atherosclerotic calcification in the cavernous carotid bilaterally without significant stenosis. Anterior and middle cerebral arteries patent bilaterally without stenosis. Enhancing lesion in the left cavernous sinus medial to the cavernous carotid. This shows homogeneous enhancement and has rounded contours measuring approximately 8 x 14 mm. The enhancement is significantly less than arterial enhancement and is slightly greater than venous enhancement. Therefore, this is not felt to be an aneurysm. This has been present on prior studies and showed enhancement on the MRI 2016. This has been followed as a probable meningioma. No change since the prior MRA of 09/16/2015 Posterior circulation: Both vertebral arteries are widely patent to the basilar. PICA AICA superior cerebellar and posterior cerebral arteries patent bilaterally. Fetal origin of the posterior cerebral artery bilaterally with relatively small basilar, normal variation. Venous sinuses: Patent Anatomic variants: None Delayed phase: Not performed Review of the MIP images confirms the above findings  IMPRESSION: 1. Negative for emergent large vessel occlusion 2. Atherosclerotic disease aortic arch and carotid bifurcation bilaterally without significant stenosis. Moderate stenosis origin of both vertebral artery due to calcific disease. 3. Enhancing mass in the left cavernous sinus is stable from prior studies and may represent a meningioma measuring 8 x 14 mm. Electronically Signed:  By: Franchot Gallo M.D. On: 09/22/2017 15:05   [name]  Result Date: 09/22/2017 CLINICAL DATA:  Initial evaluation for acute left-sided facial droop with left facial weakness. EXAM: MRI HEAD WITHOUT CONTRAST TECHNIQUE: Multiplanar, multiecho pulse sequences of the brain and surrounding structures were obtained without intravenous contrast. COMPARISON:  Prior CTA from earlier the same day as well as previous MRI from 07/30/2017 FINDINGS: Brain: Examination degraded by motion artifact. Generalized age-related cerebral atrophy, stable. Extensive patchy and confluent T2/FLAIR hyperintensity within the periventricular and deep white matter both cerebral hemispheres, most consistent with chronic small vessel ischemic disease, also similar to previous. Encephalomalacia with gliosis involving the left temporal occipital region with associated chronic susceptibility artifact, stable from previous. Additional small remote bilateral cerebellar infarcts noted. Chronic right thalamic lacunar infarct No abnormal foci of restricted diffusion to suggest acute or subacute ischemia. Gray-white matter differentiation otherwise maintained. No acute intracranial hemorrhage. 1 cm meningioma at the posterior right cerebral convexity without significant mass effect, stable. No other mass lesion. No mass effect or midline shift. No hydrocephalus. No extra-axial fluid collection. Pituitary gland within normal limits. Vascular: Major intracranial vascular flow voids maintained. Skull and upper cervical spine: Craniocervical junction normal.  Upper cervical spine within normal limits. Bone marrow signal intensity normal. No scalp soft tissue abnormality. Sinuses/Orbits: Globes and orbital soft tissues within normal limits. Paranasal sinuses are clear. No mastoid effusion. Inner ear structures normal. Other: None. IMPRESSION: 1. No acute intracranial abnormality. 2. Chronic posterior left MCA territory infarct, with remote right thalamic lacunar infarct and old bilateral cerebellar infarcts. 3. Atrophy with moderate chronic small vessel ischemic disease, stable. Electronically Signed   By: Jeannine Boga M.D.   On: 09/22/2017 19:58   Ct Head Code Stroke Wo Contrast  Result Date: 09/22/2017 CLINICAL DATA:  Code stroke.  Left leg weakness EXAM: CT HEAD WITHOUT CONTRAST TECHNIQUE: Contiguous axial images were obtained from the base of the skull through the vertex without intravenous contrast. COMPARISON:  08/06/2017 FINDINGS: Brain: Moderate remote infarct in the left cerebrum along the parietal lobe reaching into the occipital cortex. Small remote bilateral cerebellar infarcts. Remote lacunar infarct in the right caudate head. Ischemic gliosis in the cerebral white matter. No hemorrhage, hydrocephalus, or intra-axial masslike finding. There is a partially calcified extra-axial mass the high right frontal convexity measuring 1 cm. Vascular: No hyperdense vessel. Skull: No acute finding Sinuses/Orbits: Negative Other: These results were communicated to Dr. Rory Percy at 2:30 pmon 9/10/2019by text page via the [facility] [facility] messaging system. ASPECTS [facility] Stroke [facility] Early CT Score) -right hemisphere - Ganglionic level infarction (caudate, lentiform nuclei, internal capsule, insula, M1-M3 cortex): 7 - Supraganglionic infarction (M4-M6 cortex): 3 Total score (0-10 with 10 being normal): 10 IMPRESSION: 1. No acute finding.  ASPECTS is 10 in the right hemisphere. 2. Moderate remote left parietooccipital infarct. Remote small vessel infarcts in the infra and  supratentorial brain. 3. 1 cm calcified meningioma along the right frontal convexity. Electronically Signed   By: Monte Fantasia M.D.   On: 09/22/2017 14:32    EKG: Independently reviewed.  A. fib.  Assessment/Plan Principal Problem:   TIA (transient ischemic attack) Active Problems:   Controlled type 2 diabetes mellitus with diabetic neuropathy (HCC)   Seizure disorder (HCC)   Left-sided weakness   Stroke (cerebrum) (HCC)   GI bleed   PAF (paroxysmal atrial fibrillation) ([place])    1. Possible TIA -appreciate neurology consult.  Patient is on aspirin Lipitor 80 mg and has been placed  on neurochecks.  MRI brain is negative.  CT angiogram of the head and neck does not show any large vessel obstruction.  Check hemoglobin A1c lipid panel and 2D echo.  Physical therapy consult.  Patient has passed swallow evaluation. 2. Left lower extremity pain -cause not clear but does not show any signs of acute ischemia. 3. History of diabetes mellitus type 2 has been placed on sliding scale coverage. 4. Seizure disorder on Keppra. 5. History of GI bleed on PPI and Carafate. 6. Paroxysmal atrial fibrillation not on anticoagulation secondary to GI bleed.  Patient is originally [facility] device. 7. Hypertension on lisinopril. 8. Hypothyroidism on Synthroid. 9. Diastolic CHF on Lasix which should be held for now.   DVT prophylaxis: Heparin. Code Status: Full code. Family Communication: Discussed with patient. Disposition Plan: Home. Consults called: Neurology. Admission status: Observation.   Rise Patience MD [facility] Pager [phone].  If 7PM-7AM, please contact night-coverage [email or web address] Password [email or web address]  09/22/2017, 10:08 PM

## 2017-09-22 NOTE — ED Notes (Signed)
Attempted to call report

## 2017-09-22 NOTE — ED Provider Notes (Signed)
[facility] Emergency Department Provider Note MRN:  [id number]  Arrival date & time: 09/23/17     Chief Complaint   Stroke like symptoms  History of Present Illness   Samantha Aguirre is a 72 y.o. year-old female with a history of A. fib, stroke presenting to the ED with chief complaint of strokelike symptoms.  Patient explains she was in an ambulance on the way to [facility] for evaluation of her leg, when at about 2 PM she experienced sudden onset left-sided weakness.  Noted facial droop and weakness of the arm and leg on the left side by EMS, prompting them to bring her to [facility] instead.  Patient endorsing mild dull frontal headache, no exacerbating or alleviating factors.  Weakness has been constant since onset.  Review of Systems  A complete 10 system review of systems was obtained and all systems are negative except as noted in the HPI and PMH.   Patient's Health History    Past Medical History:  Diagnosis Date  . Acid reflux   . Arthritis    Bilateral knees, hands, feet  . Asthma   . Atrial fibrillation ([place])   . Cancer ([place])   . CHF (congestive heart failure) (HCC)    Diastolic CHF  . COPD (chronic obstructive pulmonary disease) ([place])   . Coronary artery disease   . Diabetes mellitus without complication ([place])   . Frequent headaches   . GI bleed   . HLD (hyperlipidemia)   . Hypertension   . Seizures ([place])   . Skin cancer 2015   Suspected basal cell carcinoma on nose, surgically removed  . Stroke ([place]) 04/2017  . Thyroid disease   . Tremors of nervous system     Past Surgical History:  Procedure Laterality Date  . ABDOMINAL HYSTERECTOMY  1976  . APPENDECTOMY  1980  . CAROTID ENDARTERECTOMY    . CHOLECYSTECTOMY  1980  . COLONOSCOPY N/A 02/20/2017   Procedure: COLONOSCOPY;  Surgeon: Lin Landsman, MD;  Location[facility] END[facility]COPY;  Service: Gastroenterology;  Laterality: N/A;  . ESOPHAGOGASTRODUODENOSCOPY N/A 02/20/2017   Procedure: ESOPHAGOGASTRODUODENOSCOPY (EGD);  Surgeon: Lin Landsman, MD;  Location: [facility] ENDOSCOPY;  Service: Gastroenterology;  Laterality: N/A;  . ESOPHAGOGASTRODUODENOSCOPY (EGD) WITH PROPOFOL N/A 07/08/2017   Procedure: ESOPHAGOGASTRODUODENOSCOPY (EGD) WITH PROPOFOL;  Surgeon: [name], MD;  Location: [facility] ENDOSCOPY;  Service: Endoscopy;  Laterality: N/A;  . [place]  . GIVENS CAPSULE STUDY N/A 07/09/2017   Procedure: GIVENS CAPSULE STUDY;  Surgeon: Jonathon Bellows, MD;  Location: [facility] ENDOSCOPY;  Service: Gastroenterology;  Laterality: N/A;  . KNEE SURGERY     left  . ROTATOR CUFF REPAIR     right  . TONSILLECTOMY  1950    Family History  Problem Relation Age of Onset  . Breast cancer Mother   . Heart disease Mother   . Stroke Mother   . Cancer Mother   . COPD Mother   . Diabetes Mother   . Heart disease Father   . Diabetes Father   . Stroke Father   . Alcohol abuse Sister   . Drug abuse Sister   . Stroke Sister   . Cancer Sister   . Mental illness Sister   . Heart disease Brother   . Arthritis Brother   . Diabetes Brother   . Heart disease Maternal Grandfather   . Heart disease Paternal Grandfather     Social History   Socioeconomic History  . Marital status: Divorced  Spouse name: Not on file  . Number of children: 2  . Years of education: 49  . Highest education level: 12th grade  Occupational History  . Occupation: Disabled  Social Needs  . Financial resource strain: Somewhat hard  . Food insecurity:    Worry: Sometimes true    Inability: Sometimes true  . Transportation needs:    Medical: No    Non-medical: No  Tobacco Use  . Smoking status: Current Every Day Smoker    Packs/day: 1.00    Years: 53.00    Pack years: 53.00    Types: Cigarettes  . Smokeless tobacco: Never Used  Substance and Sexual Activity  . Alcohol use: No  . Drug use: No  . Sexual activity: Not Currently  Lifestyle  . Physical activity:    Days per  week: 2 days    Minutes per session: 20 min  . Stress: Rather much  Relationships  . Social connections:    Talks on phone: More than three times a week    Gets together: More than three times a week    Attends religious service: Never    Active member of club or organization: No    Attends meetings of clubs or organizations: Never    Relationship status: Divorced  . Intimate partner violence:    Fear of current or ex partner: No    Emotionally abused: No    Physically abused: No    Forced sexual activity: No  Other Topics Concern  . Not on file  Social History Narrative  . Not on file     Physical Exam  Vital Signs and Nursing Notes reviewed Vitals:   09/22/17 2245 09/22/17 2329  BP: (!) 121/53 110/61  Pulse: (!) 50 (!) 47  Resp: 18 18  Temp:  97.6 F (36.4 C)  SpO2: 99% 100%    CONSTITUTIONAL: Chronically ill-appearing, NAD NEURO:  Alert and oriented x 3, moderate left facial droop, mild to moderate left arm and leg weakness EYES:  eyes equal and reactive ENT/NECK:  no LAD, no JVD CARDIO: Regular rate, well-perfused, normal S1 and S2 PULM:  CTAB no wheezing or rhonchi GI/GU:  normal bowel sounds, non-distended, non-tender MSK/SPINE:  No gross deformities, no edema SKIN:  no rash, atraumatic PSYCH:  Appropriate speech and behavior  Diagnostic and Interventional Summary    EKG Interpretation  Date/Time:    Ventricular Rate:    PR Interval:    QRS Duration:   QT Interval:    QTC Calculation:   R Axis:     Text Interpretation:        Labs Reviewed  CBC - Abnormal; Notable for the following components:      Result Value   Hemoglobin 11.3 (*)    RDW 18.2 (*)    All other components within normal limits  COMPREHENSIVE METABOLIC PANEL - Abnormal; Notable for the following components:   Potassium 3.3 (*)    Glucose, Bld 167 (*)    Total Protein 6.0 (*)    Albumin 3.4 (*)    Total Bilirubin 0.2 (*)    All other components within normal limits    URINALYSIS, ROUTINE W REFLEX MICROSCOPIC - Abnormal; Notable for the following components:   Specific Gravity, Urine 1.031 (*)    All other components within normal limits  I-STAT CHEM 8, ED - Abnormal; Notable for the following components:   Potassium 3.3 (*)    Glucose, Bld 158 (*)    Calcium, Ion 1.12 (*)  TCO2 20 (*)    Hemoglobin 11.9 (*)    HCT 35.0 (*)    All other components within normal limits  CBG MONITORING, ED - Abnormal; Notable for the following components:   Glucose-Capillary 170 (*)    All other components within normal limits  ETHANOL  PROTIME-INR  APTT  DIFFERENTIAL  RAPID URINE DRUG SCREEN, HOSP PERFORMED  GLUCOSE, CAPILLARY  HEMOGLOBIN A1C  LIPID PANEL  COMPREHENSIVE METABOLIC PANEL  CBC  I-STAT TROPONIN, ED    [name]  Final Result    CT ANGIO HEAD W OR WO CONTRAST  Final Result  Addendum 1 of 1  ADDENDUM REPORT: 09/22/2017 15:14    ADDENDUM:  7 mm enhancing right parasagittal mass unchanged from prior studies  consistent with meningioma.      Electronically Signed    By: Franchot Gallo M.D.    On: 09/22/2017 15:14      Final    CT ANGIO NECK W OR WO CONTRAST  Final Result  Addendum 1 of 1  ADDENDUM REPORT: 09/22/2017 15:14    ADDENDUM:  7 mm enhancing right parasagittal mass unchanged from prior studies  consistent with meningioma.      Electronically Signed    By: Franchot Gallo M.D.    On: 09/22/2017 15:14      Final    CT HEAD CODE STROKE WO CONTRAST  Final Result      Medications  aspirin tablet 325 mg (has no administration in time range)  lisinopril (PRINIVIL,ZESTRIL) tablet 20 mg (has no administration in time range)  nitroGLYCERIN (NITROSTAT) SL tablet 0.4 mg (has no administration in time range)  DULoxetine (CYMBALTA) DR capsule 60 mg (has no administration in time range)  levothyroxine (SYNTHROID, LEVOTHROID) tablet 112 mcg (has no administration in time range)  pantoprazole (PROTONIX) EC tablet  40 mg (has no administration in time range)  sucralfate (CARAFATE) tablet 1 g (has no administration in time range)  ferrous sulfate tablet 325 mg (has no administration in time range)  vitamin B-12 (CYANOCOBALAMIN) tablet 1,000 mcg (has no administration in time range)  gabapentin (NEURONTIN) capsule 800 mg (has no administration in time range)  levETIRAcetam (KEPPRA) tablet 500 mg (has no administration in time range)  feeding supplement (GLUCERNA SHAKE) (GLUCERNA SHAKE) liquid 237 mL (has no administration in time range)  fluticasone (FLONASE) 50 MCG/ACT nasal spray 1 spray (has no administration in time range)  umeclidinium bromide (INCRUSE ELLIPTA) 62.5 MCG/INH 1 puff (has no administration in time range)  albuterol (PROVENTIL) (2.5 MG/3ML) 0.083% nebulizer solution 3 mL (has no administration in time range)   stroke: mapping our early stages of recovery book (has no administration in time range)  acetaminophen (TYLENOL) tablet 650 mg (has no administration in time range)    Or  acetaminophen (TYLENOL) solution 650 mg (has no administration in time range)    Or  acetaminophen (TYLENOL) suppository 650 mg (has no administration in time range)  heparin injection 5,000 Units (has no administration in time range)  insulin aspart (novoLOG) injection 0-9 Units (has no administration in time range)  atorvastatin (LIPITOR) tablet 80 mg (has no administration in time range)  iopamidol (ISOVUE-370) 76 % injection 50 mL (50 mLs Intravenous Contrast Given 09/22/17 1436)  diphenhydrAMINE (BENADRYL) injection 25 mg (25 mg Intravenous Given 09/22/17 1415)  fentaNYL (SUBLIMAZE) injection 25 mcg (25 mcg Intravenous Given 09/22/17 1643)  LORazepam (ATIVAN) injection 1 mg (1 mg Intravenous Given 09/22/17 1823)     Procedures  Critical Care Critical Care Documentation Critical care time provided by me (excluding procedures): 35 minutes  Condition necessitating critical care: Concern for acute stroke,  initiation of code stroke protocol  Components of critical care management: reviewing of prior records, laboratory and imaging interpretation, frequent re-examination and reassessment of vital signs, discussion with consulting services    ED Course and Medical Decision Making  I have reviewed the triage vital signs and the nursing notes.  Pertinent labs & imaging results that were available during my care of the patient were reviewed by me and considered in my medical decision making (see below for details).  Acute onset left-sided weakness concerning for stroke in this 72 year old female with history of stroke.  Patient was code stroke alerted prior to my evaluation, per neurology will undergo MRI.  Anticipating admission.  MRI without evidence of acute stroke, patient still exhibiting some left-sided weakness but seems to have improved.  Admitted to hospitalist service for further care.  Barth Kirks. [place]onia Small, [place] [email or web address]  Final Clinical Impressions(s) / ED Diagnoses     ICD-10-CM   1. Weakness R53.1   2. TIA (transient ischemic attack) G45.9     ED Discharge Orders    None         Maudie Flakes, MD 09/23/17 0003

## 2017-09-22 NOTE — ED Notes (Signed)
Patient transported to MRI 

## 2017-09-22 NOTE — Code Documentation (Signed)
72 yo female coming from ambulance while being transported to [facility] for leg pain. Pt was noted to have a sudden onset of left sensory decrease on the face and left worsening facial droop. [place] [facility] activated a Code Stroke. Stroke Team met patient upon arrival. Initial NIHSS 6 due to missed question, left facial palsy, baseline left arm weakness, baseline left leg weakness, left sensory decrease, and baseline dysarthria. CT Head completed and showed no signs of hemorrhage. CTA completed. No tPA due to patient being too mild to treat. Not an endovascular candidate. Pt to have MRI and be admitted. Handoff given to [facility], Therapist, sports.

## 2017-09-23 ENCOUNTER — Encounter (HOSPITAL_COMMUNITY): Payer: Self-pay

## 2017-09-23 ENCOUNTER — Other Ambulatory Visit: Payer: Self-pay | Admitting: *Deleted

## 2017-09-23 ENCOUNTER — Observation Stay (HOSPITAL_BASED_OUTPATIENT_CLINIC_OR_DEPARTMENT_OTHER): Payer: Medicare Other

## 2017-09-23 ENCOUNTER — Other Ambulatory Visit: Payer: Self-pay

## 2017-09-23 DIAGNOSIS — I63412 Cerebral infarction due to embolism of left middle cerebral artery: Secondary | ICD-10-CM

## 2017-09-23 DIAGNOSIS — K31811 Angiodysplasia of stomach and duodenum with bleeding: Secondary | ICD-10-CM

## 2017-09-23 DIAGNOSIS — G40909 Epilepsy, unspecified, not intractable, without status epilepticus: Secondary | ICD-10-CM | POA: Diagnosis not present

## 2017-09-23 DIAGNOSIS — I480 Paroxysmal atrial fibrillation: Secondary | ICD-10-CM | POA: Diagnosis not present

## 2017-09-23 DIAGNOSIS — G459 Transient cerebral ischemic attack, unspecified: Secondary | ICD-10-CM | POA: Diagnosis not present

## 2017-09-23 DIAGNOSIS — E1140 Type 2 diabetes mellitus with diabetic neuropathy, unspecified: Secondary | ICD-10-CM | POA: Diagnosis not present

## 2017-09-23 LAB — GLUCOSE, CAPILLARY
GLUCOSE-CAPILLARY: 123 mg/dL — AB (ref 70–99)
Glucose-Capillary: 115 mg/dL — ABNORMAL HIGH (ref 70–99)
Glucose-Capillary: 145 mg/dL — ABNORMAL HIGH (ref 70–99)
Glucose-Capillary: 129 mg/dL — ABNORMAL HIGH (ref 70–99)
Glucose-Capillary: 153 mg/dL — ABNORMAL HIGH (ref 70–99)

## 2017-09-23 LAB — LIPID PANEL
Cholesterol: 149 mg/dL (ref 0–200)
HDL: 41 mg/dL (ref >40)
LDL CALC: 73 mg/dL (ref 0–99)
Total CHOL/HDL Ratio: 3.6 RATIO
Triglycerides: 177 mg/dL — ABNORMAL HIGH (ref <150)
VLDL: 35 mg/dL (ref 0–40)

## 2017-09-23 LAB — COMPREHENSIVE METABOLIC PANEL
ALT: 16 U/L (ref 0–44)
AST: 19 U/L (ref 15–41)
Albumin: 3.2 g/dL — ABNORMAL LOW (ref 3.5–5.0)
Alkaline Phosphatase: 92 U/L (ref 38–126)
Anion gap: 10 (ref 5–15)
BUN: 6 mg/dL — AB (ref 8–23)
CHLORIDE: 106 mmol/L (ref 98–111)
CO2: 24 mmol/L (ref 22–32)
Calcium: 8.9 mg/dL (ref 8.9–10.3)
Creatinine, Ser: 0.63 mg/dL (ref 0.44–1.00)
GFR calc Af Amer: >60 mL/min (ref >60)
Glucose, Bld: 117 mg/dL — ABNORMAL HIGH (ref 70–99)
POTASSIUM: 3.4 mmol/L — AB (ref 3.5–5.1)
SODIUM: 140 mmol/L (ref 135–145)
Total Bilirubin: 0.4 mg/dL (ref 0.3–1.2)
Total Protein: 5.7 g/dL — ABNORMAL LOW (ref 6.5–8.1)

## 2017-09-23 LAB — CBC
HCT: 36.7 % (ref 36.0–46.0)
Hemoglobin: 11.3 g/dL — ABNORMAL LOW (ref 12.0–15.0)
MCH: 26.9 pg (ref 26.0–34.0)
MCHC: 30.8 g/dL (ref 30.0–36.0)
MCV: 87.4 fL (ref 78.0–100.0)
Platelets: 234 10*3/uL (ref 150–400)
RBC: 4.2 MIL/uL (ref 3.87–5.11)
RDW: 18 % — AB (ref 11.5–15.5)
WBC: 7.5 10*3/uL (ref 4.0–10.5)

## 2017-09-23 LAB — HEMOGLOBIN A1C
HEMOGLOBIN A1C: 6.4 % — AB (ref 4.8–5.6)
Mean Plasma Glucose: 136.98 mg/dL

## 2017-09-23 LAB — ECHOCARDIOGRAM COMPLETE
HEIGHTINCHES: 61 in
Weight: 2836 oz

## 2017-09-23 MED ORDER — POTASSIUM CHLORIDE CRYS ER 20 MEQ PO TBCR
40.0000 meq | EXTENDED_RELEASE_TABLET | Freq: Once | ORAL | Status: AC
  Administered 2017-09-23: 40 meq via ORAL
  Filled 2017-09-23: qty 2

## 2017-09-23 MED ORDER — INFLUENZA VAC SPLIT HIGH-DOSE 0.5 ML IM SUSY
0.5000 mL | PREFILLED_SYRINGE | INTRAMUSCULAR | Status: DC
  Filled 2017-09-23: qty 0.5

## 2017-09-23 NOTE — Consult Note (Addendum)
   [facility] CM Inpatient Consult   09/23/2017  Samantha Aguirre 02-Sep-1945 [id number]    Spoke with inpatient RNCM to make aware [facility] Care Management is active. She is followed by [place] and [facility] LCSW. See chart review then encounters for patient outreach details.    Inpatient RNCM indicates that SNF is recommended. However, pt is under observation status which does not qualify for SNF thru Medicare. She lives in [place], [place] and she does not qualify for [place] program either. Inpatient RNCM has spoken with patient about other options. However, patient unwilling to give up her check to utilize her Medicaid benefit for SNF placement.   Will continue to follow for disposition planning and progression. Sent update to [place] and [place] LCSW.   Marthenia Rolling, MSN-Ed, RN,BSN [facility] Liaison [phone]

## 2017-09-23 NOTE — Evaluation (Signed)
Physical Therapy Evaluation Patient Details Name: Samantha Aguirre MRN: [id number] DOB: [birth year] Today's Date: 09/23/2017   History of Present Illness  Pt is a 72 y/o female with L LE pain on way to [facility], but EMS noted L facial droop and increased L sided weakness therefore re-routed to [facility].  PMH significant for CVA (04/2017) with recent TPA, afib not on anticoagulation d/t recent GI bleed, DM type 2, seizures, hyperlipidemia, arthritis, CHF, tremors.  MRI negative for acute abnormailty, chronic posterior L MCA territory, remote R thalamic lacunar, and B cerebellar infarcts.   Clinical Impression  Patient presents with decreased independence and safety with mobility with recent history of two falls at home.  Currently needs min to minguard assist for safety and has continued high risk for falls even with walker in the home.  Reports was taking care of some of household tasks, but now unable.  Will need STSNF level rehab prior to d/c home alone.     Follow Up Recommendations SNF;Supervision/Assistance - 24 hour    Equipment Recommendations  None recommended by PT    Recommendations for Other Services       Precautions / Restrictions Precautions Precautions: Fall Restrictions Weight Bearing Restrictions: No      Mobility  Bed Mobility Overal bed mobility: Needs Assistance Bed Mobility: Supine to Sit     Supine to sit: Min guard     General bed mobility comments: up in chair s/p OT  Transfers Overall transfer level: Needs assistance Equipment used: Rolling walker (2 wheeled) Transfers: Sit to/from Stand Sit to Stand: Min guard         General transfer comment: assist for safety, heavy UE use  Ambulation/Gait Ambulation/Gait assistance: Min assist Gait Distance (Feet): 120 Feet Assistive device: Rolling walker (2 wheeled) Gait Pattern/deviations: Step-to pattern;Step-through pattern;Antalgic;Shuffle     General Gait Details: no  overt LOB, but assist for safety throughout with weak and painful L knee and using walker to unweight, cues for proximity to walker   Stairs            Wheelchair Mobility    Modified Rankin (Stroke Patients Only) Modified Rankin (Stroke Patients Only) Pre-Morbid Rankin Score: Slight disability Modified Rankin: Moderately severe disability     Balance Overall balance assessment: Needs assistance;History of Falls Sitting-balance support: No upper extremity supported;Feet supported Sitting balance-Leahy Scale: Fair     Standing balance support: During functional activity;No upper extremity supported Standing balance-Leahy Scale: Poor Standing balance comment: able to engage in ADLs with 0 hand support given min guard, noted 1 LOB when turning during mobility                             Pertinent Vitals/Pain Pain Assessment: Faces Pain Score: 7  Faces Pain Scale: Hurts little more Pain Location: L knee Pain Descriptors / Indicators: Aching;Sore Pain Intervention(s): Monitored during session;Repositioned;Ice applied    Home Living Family/patient expects to be discharged to:: Private residence Living Arrangements: Alone Available Help at Discharge: Family;Friend(s);Available PRN/intermittently Type of Home: Apartment Home Access: Level entry     Home Layout: One level Home Equipment: Walker - 2 wheels;Cane - quad;Shower seat;Adaptive equipment Additional Comments: not using power chair    Prior Function Level of Independence: Independent with assistive device(s)   Gait / Transfers Assistance Needed: reports using RW for mobility at home   ADL's / Homemaking Assistance Needed: independent ADLs, limited IADLs, not driving, has  assistance for medication and meals  Comments: reports having [place]; relates two recent falls both onto knees with re-injury of L knee     Hand Dominance   Dominant Hand: Right    Extremity/Trunk Assessment   Upper  Extremity Assessment Upper Extremity Assessment: Defer to OT evaluation RUE Deficits / Details: 4/5 throughout RUE Coordination: (tremors noted, functioanl) LUE Deficits / Details: grossly 3+/5 throughout  LUE Sensation: decreased light touch LUE Coordination: decreased gross motor;decreased fine motor(dysmetric and tremors noted )    Lower Extremity Assessment Lower Extremity Assessment: LLE deficits/detail LLE Deficits / Details: describes pain over medialateral, and posterior aspect of knee down to medial ankle, worse with movement and weight bearing, better with ice; AROM WFL, but slow and painful for flexion; able to lift leg with knee extended with time and pain, knee extension 4-/5 and painful, flexion 2+/5, ankle DF 4-/5 LLE Sensation: decreased light touch LLE Coordination: decreased fine motor    Cervical / Trunk Assessment Cervical / Trunk Assessment: Normal  Communication   Communication: Expressive difficulties(word finding deficits)  Cognition Arousal/Alertness: Awake/alert Behavior During Therapy: WFL for tasks assessed/performed Overall Cognitive Status: No family/caregiver present to determine baseline cognitive functioning Area of Impairment: Safety/judgement                         Safety/Judgement: Decreased awareness of safety Awareness: Emergent Problem Solving: Slow processing;Decreased initiation;Requires verbal cues;Difficulty sequencing        General Comments      Exercises     Assessment/Plan    PT Assessment Patient needs continued PT services  PT Problem List Decreased strength;Decreased mobility;Decreased safety awareness;Decreased balance;Pain;Decreased knowledge of use of DME;Decreased activity tolerance       PT Treatment Interventions DME instruction;Therapeutic activities;Gait training;Therapeutic exercise;Patient/family education;Balance training;Functional mobility training    PT Goals (Current goals can be found in the  Care Plan section)  Acute Rehab PT Goals Patient Stated Goal: to get stronger  PT Goal Formulation: With patient Time For Goal Achievement: 10/07/17 Potential to Achieve Goals: Good    Frequency Min 3X/week   Barriers to discharge Decreased caregiver support      Co-evaluation               AM-PAC PT "6 Clicks" Daily Activity  Outcome Measure Difficulty turning over in bed (including adjusting bedclothes, sheets and blankets)?: A Little Difficulty moving from lying on back to sitting on the side of the bed? : Unable Difficulty sitting down on and standing up from a chair with arms (e.g., wheelchair, bedside commode, etc,.)?: Unable Help needed moving to and from a bed to chair (including a wheelchair)?: A Little Help needed walking in hospital room?: A Little Help needed climbing 3-5 steps with a railing? : A Lot 6 Click Score: 13    End of Session Equipment Utilized During Treatment: Gait belt Activity Tolerance: Patient limited by fatigue Patient left: with call bell/phone within reach;in chair;with chair alarm set   PT Visit Diagnosis: Other abnormalities of gait and mobility (R26.89);Muscle weakness (generalized) (M62.81);History of falling (Z91.81);Pain Pain - Right/Left: Left Pain - part of body: Knee    Time: 9417-4081 PT Time Calculation (min) (ACUTE ONLY): 28 min   Charges:   PT Evaluation $PT Eval Moderate Complexity: 1 Mod PT Treatments $Gait Training: 8-22 mins        Magda Kiel, [place] [facility] Services [phone] 09/23/2017   Reginia Naas 09/23/2017, 11:19 AM

## 2017-09-23 NOTE — Care Management Note (Signed)
Case Management Note  Patient Details  Name: Samantha Aguirre MRN: [id number] Date of Birth: [birth year]  Subjective/Objective:    Pt in with TIA. She is from home alone. Pt is active with [facility]. Pts sister assist with transportation or she uses Medicaid transport.        Pt has a shower chair, wheelchair and walker. She denies any issues obtaining her medications.         Action/Plan: PT/OT recommending SNF. Pt doesn't have a qualifying stay. CM met with her about going to rehab under her Medicaid. Pt not will to give up her check for the month.  CM inquired about staying with someone and she states there is no one she can stay with.  Patient feels she can d/c home with continuation of [place] services. CM updated MD.  Expected Discharge Date:  09/23/17               Expected Discharge Plan:  [place]  In-House Referral:     Discharge planning Services  CM Consult  Post Acute Care Choice:    Choice offered to:     DME Arranged:    DME Agency:     HH Arranged:    HH Agency:     Status of Service:  In process, will continue to follow  If discussed at Long Length of Stay Meetings, dates discussed:    Additional Comments:  Pollie Friar, RN 09/23/2017, 3:09 PM

## 2017-09-23 NOTE — Progress Notes (Signed)
Assumed care at 1630

## 2017-09-23 NOTE — Progress Notes (Signed)
OT Cancellation Note  Patient Details Name: Samantha Aguirre MRN: [id number] DOB: April 07, 1945   Cancelled Treatment:    Reason Eval/Treat Not Completed: Patient at procedure or test/ unavailable. Pt with echo in room at this time. Will check back as able.   [place], OTR/L [place] Pager [phone] Office [place] 09/23/2017, 9:09 AM

## 2017-09-23 NOTE — Evaluation (Addendum)
Occupational Therapy Evaluation Patient Details Name: Samantha Aguirre MRN: [id number] DOB: [birth year] Today's Date: 09/23/2017    History of Present Illness Pt is a 72 y/o female with L LE pain on way to [facility] but EMS noted L facial droop and increased L sided weakness therefore re-routed to [facility] ER.  PMH significant for CVA (04/2017) with recent TPA, afib not on anticoagulation d/t recent GI bleed, DM type 2, seizures, hyperlipidemia, arthritis, CHF, tremors.  MRI negative for acute abnormailty, chronic posterior L MCA territory, remote R thalamic lacunar, and B cerebellar infarcts.    Clinical Impression   PTA patient reports living alone with intermittent assistance, completes ADLs and mobility using RW with independence, limited IADLs (prefilled medication, not driving, family/friends assisting with meals).  She currently requires setup assistance for UB ADLs seated, min assist for LB ADLs, min guard assist for toileting and grooming standing, and min guard assist for toilet transfers.  Limited by L UE weakness, impaired cognition (problem solving, safety awareness, and word finding), impaired balance, decreased activity tolerance, L knee pain, impaired sensation, and safety.  Pt reports weakness on L side worse than baseline, increased difficulty word finding, and noted loss of balance with assist to sit EOB when turning with RW. Based on performance today, patient will benefit from continued OT services while admitted and continued SNF level rehab in order to optimize independence and return to PLOF.  Will continue to follow while admitted.     Follow Up Recommendations  SNF;Supervision/Assistance - 24 hour    Equipment Recommendations  Other (comment)(TBD at next venue of care)    Recommendations for Other Services       Precautions / Restrictions Precautions Precautions: Fall Restrictions Weight Bearing Restrictions: No      Mobility Bed  Mobility Overal bed mobility: Needs Assistance Bed Mobility: Supine to Sit     Supine to sit: Min guard     General bed mobility comments: min guard for safety  Transfers Overall transfer level: Needs assistance Equipment used: Rolling walker (2 wheeled) Transfers: Sit to/from Stand Sit to Stand: Min guard         General transfer comment: min guard for safety, increased time and effort from low commode     Balance Overall balance assessment: Needs assistance;History of Falls Sitting-balance support: No upper extremity supported;Feet supported Sitting balance-Leahy Scale: Fair     Standing balance support: During functional activity;No upper extremity supported Standing balance-Leahy Scale: Poor Standing balance comment: able to engage in ADLs with 0 hand support given min guard, noted 1 LOB when turning during mobility                           ADL either performed or assessed with clinical judgement   ADL Overall ADL's : Needs assistance/impaired Eating/Feeding: Set up;Sitting   Grooming: Min guard;Standing;Cueing for safety Grooming Details (indicate cue type and reason): min guard for safety, washing hands at snik Upper Body Bathing: Minimal assistance;Sitting   Lower Body Bathing: Minimal assistance;Sit to/from stand Lower Body Bathing Details (indicate cue type and reason): requires assist to reach L LE, min guard when standing  Upper Body Dressing : Set up;Sitting   Lower Body Dressing: Minimal assistance;Sit to/from stand Lower Body Dressing Details (indicate cue type and reason): unable to don L sock, min guard for balance Toilet Transfer: Min guard;Ambulation;Regular Toilet;Grab bars;RW Armed forces technical officer Details (indicate cue type and reason): min guard for safety, increased  time and effort to ascend Toileting- Water quality scientist and Hygiene: Min guard;Sit to/from stand       Functional mobility during ADLs: Minimal assistance;Rolling  walker       Vision Baseline Vision/History: Wears glasses Wears Glasses: Reading only Patient Visual Report: No change from baseline Vision Assessment?: No apparent visual deficits Eye Alignment: Within Functional Limits Ocular Range of Motion: Within Functional Limits Alignment/Gaze Preference: Within Defined Limits Tracking/Visual Pursuits: Able to track stimulus in all quads without difficulty Convergence: Within functional limits Visual Fields: No apparent deficits     Perception     Praxis      Pertinent Vitals/Pain Pain Assessment: Faces Faces Pain Scale: Hurts little more Pain Location: L knee Pain Descriptors / Indicators: Aching;Sore Pain Intervention(s): Limited activity within patient's tolerance;Repositioned;Monitored during session     Hand Dominance Right   Extremity/Trunk Assessment Upper Extremity Assessment Upper Extremity Assessment: RUE deficits/detail;LUE deficits/detail RUE Deficits / Details: 4/5 throughout RUE Coordination: (tremors noted, functioanl) LUE Deficits / Details: grossly 3+/5 throughout  LUE Sensation: decreased light touch LUE Coordination: decreased gross motor;decreased fine motor(dysmetric and tremors noted )   Lower Extremity Assessment Lower Extremity Assessment: Defer to PT evaluation   Cervical / Trunk Assessment Cervical / Trunk Assessment: Normal   Communication Communication Communication: Expressive difficulties(word finding deficits)   Cognition Arousal/Alertness: Awake/alert Behavior During Therapy: WFL for tasks assessed/performed Overall Cognitive Status: No family/caregiver present to determine baseline cognitive functioning Area of Impairment: Safety/judgement;Awareness;Problem solving                         Safety/Judgement: Decreased awareness of safety Awareness: Emergent Problem Solving: Slow processing;Decreased initiation;Requires verbal cues;Difficulty sequencing     General Comments        Exercises     Shoulder Instructions      Home Living Family/patient expects to be discharged to:: Private residence Living Arrangements: Alone Available Help at Discharge: Family;Friend(s);Available PRN/intermittently Type of Home: Apartment Home Access: Level entry     Home Layout: One level     Bathroom Shower/Tub: Teacher, early years/pre: Standard Bathroom Accessibility: Yes How Accessible: Accessible via walker Home Equipment: [place] - 2 wheels;Cane - quad;Shower seat;Adaptive equipment Adaptive Equipment: Reacher Additional Comments: not using power chair      Prior Functioning/Environment Level of Independence: Independent with assistive device(s)  Gait / Transfers Assistance Needed: reports using RW for mobility at home  ADL's / Homemaking Assistance Needed: independent ADLs, limited IADLs, not driving, has assistance for medication and meals   Comments: reports having [place]         OT Problem List: Decreased strength;Decreased range of motion;Impaired balance (sitting and/or standing);Decreased knowledge of use of DME or AE;Impaired UE functional use;Impaired sensation;Decreased activity tolerance;Decreased coordination;Decreased cognition;Decreased safety awareness;Pain      OT Treatment/Interventions: Self-care/ADL training;DME and/or AE instruction;Balance training;Patient/family education;Neuromuscular education;Therapeutic exercise;Therapeutic activities;Cognitive remediation/compensation    OT Goals(Current goals can be found in the care plan section) Acute Rehab OT Goals Patient Stated Goal: to get stronger  OT Goal Formulation: With patient Time For Goal Achievement: 10/07/17 Potential to Achieve Goals: Good  OT Frequency: Min 2X/week   Barriers to D/C: Decreased caregiver support          Co-evaluation              AM-PAC PT "6 Clicks" Daily Activity     Outcome Measure Help from another person eating meals?:  None Help from another person taking care of  personal grooming?: A Little Help from another person toileting, which includes using toliet, bedpan, or urinal?: A Little Help from another person bathing (including washing, rinsing, drying)?: A Little Help from another person to put on and taking off regular upper body clothing?: None Help from another person to put on and taking off regular lower body clothing?: A Little 6 Click Score: 20   End of Session Equipment Utilized During Treatment: Gait belt;Rolling walker  Activity Tolerance: Patient tolerated treatment well Patient left: in chair;Other (comment)(hand off to PT )  OT Visit Diagnosis: Unsteadiness on feet (R26.81);Hemiplegia and hemiparesis;Pain Hemiplegia - Right/Left: Left Hemiplegia - dominant/non-dominant: Non-Dominant Hemiplegia - caused by: Cerebral infarction Pain - Right/Left: Left Pain - part of body: Knee;Leg                Time: 2010-0712 OT Time Calculation (min): 28 min Charges:  OT General Charges $OT Visit: 1 Visit OT Evaluation $OT Eval Moderate Complexity: 1 Mod OT Treatments $Self Care/Home Management : 8-22 mins  Delight Stare, OT Acute Rehabilitation Services Pager [phone] Office [phone]   Delight Stare 09/23/2017, 10:46 AM

## 2017-09-23 NOTE — Progress Notes (Addendum)
PROGRESS NOTE    Samantha Aguirre  ZOX:[id number] DOB: 11/13/1945 DOA: 09/22/2017 PCP: Tracie Harrier, MD   Brief Narrative:  72 year old woman with history of multiple CVAs in the past, one on 04/06/2017, with residual left-sided weakness, and with last stroke in July 2019 for left internal capsule CVA, receiving TPA, hypertension, hyperlipidemia, diabetes, atrial fibrillation initially on Eliquis, but no longer  on anticoagulation due to GI bleed in June 2019, presenting to [facility] as a code stroke, for acute onset of left facial droop noted on transport, she was being sent to [facility] for left ankle pain.  In addition, she also complained of, left facial numbness, decreased sensation in the right side of her face,  last known normal at 13:45PM on 10/12/2017.  Rankin score is 2.  NIH was 5.  CT of the head was negative for acute changes, showing multiple old strokes.  CT Angio of the head and neck without evidence of large vessel occlusion.  The patient was to see a specialist over at [facility] or [facility] for [place] device, but has not been able to do at this point.  MRI of the brain without acute intracranial abnormality.  Neurology is involved, awaiting further recommendations.  Assessment & Plan:   Principal Problem:   TIA (transient ischemic attack) Active Problems:   Controlled type 2 diabetes mellitus with diabetic neuropathy (HCC)   COPD (chronic obstructive pulmonary disease) (HCC)   Seizure disorder (HCC)   Left-sided weakness   Stroke (cerebrum) (HCC)   GI bleed   PAF (paroxysmal atrial fibrillation) (HCC)   TIA, manifested by left facial droop, facial numbness in a patient with a history of stroke, and left residual weakness.  CT of the head is negative for acute changes, showing multiple old strokes.  CT Angio of the head and neck, without evidence of large vessel occlusion.  MRI of the brain negative for acute intracranial abnormalities. SLP unremarkable.   Neurology is  following, appreciate involvement.  2D echo normal systolic, EF 65 to 81%.  Risk factors include atrial fibrillation, diabetes, family history of stroke, coronary artery disease, hypertension, hyperlipidemia advanced age, female. Continue permissive hypertension for now. Frequent neuro checks. Continue aspirin 325 mg daily Continue Lipitor 80 mg a day Continue PT and OT Patient will need to follow up on watchman device after discharge from the hospital.  Appreciate neuro evaluation  History of PAF, off of Eliquis, due to GI bleed. Rate control Continue aspirin 325 mg daily FOr evaluation at [facility] or [facility], for watchman device Monitor for any bleeding issues  Hypertension -slowly resume meds  Hyperlipidemia, Triglycerides 177 continue statins with Lipitor 80 mg daily LDL: 73  Type II Diabetes with neuropathy Current blood sugar level is 117.  Hemoglobin A1c 6.4 SSI Continue Neurontin   Anemia of chronic disease, no bleeding issues at this time.  Hemoglobin 11.3. Continue iron supplements oral.  History of seizures, no seizures were noted during this admission. Continue Keppra 500 mg daily  GERD, no acute symptoms Continue Protonix   Depression Continue home Cymbalta  Hypothyroidism: Continue home Synthroid   Tobacco abuse/ COPD , the patient is a current smoker.  Smoking cessation counseled Continue nebs,  Ellipta, FLonase   DVT prophylaxis:  SCD  Code Status: DNR   Family Communication:  None  Disposition Plan: OT/PT were recommending SNF, but she does not qualify with 3 midnight inpt stay.  Care management met with her, about going to rehab under her Medicaid, but she  will "not give up her check for the month ".  Care management inquire about staying with someone as she states that there is no one can stay with.  She feels that she can be discharged home with continuation of home health services-- similar to prior  Consultants:   Neurology    Subjective: Lying in bed comfortably, she is alert, oriented, her facial droop is resolved.  She denies any dysarthria.  She denies any dysphagia,.  She has mild headache.  She denies any vision changes.  No seizure activity was noted.  She denies any chest pain or palpitations.  No shortness of breath or cough.  She denies any lower extremity swelling or calf pain.  Other than her known left-sided weakness, no new weakness or sensory deficiencies are noted at this time.  Objective: Vitals:   09/23/17 0355 09/23/17 0743 09/23/17 1215 09/23/17 1523  BP: (!) 119/55 (!) 117/56 115/61 113/69  Pulse: (!) 53 (!) 48 60 (!) 54  Resp: '18 20 20 16  ' Temp: 97.9 F (36.6 C) 98.5 F (36.9 C) 97.6 F (36.4 C) 98.4 F (36.9 C)  TempSrc: Oral Oral Oral Oral  SpO2: 99% 98% 95% 94%  Weight:      Height:        Intake/Output Summary (Last 24 hours) at 09/23/2017 1543 Last data filed at 09/23/2017 1224 Gross per 24 hour  Intake 420 ml  Output -  Net 420 ml   Filed Weights   09/22/17 2329  Weight: 80.4 kg    Examination:  General exam: Appears calm and comfortable  Respiratory system: Clear to auscultation. Respiratory effort normal. No wheezing or rales noted Cardiovascular system: S1 & S2 heard, RRR. No JVD, murmurs, rubs, gallops or clicks. No pedal edema. Gastrointestinal system: Abdomen is nondistended, soft and nontender. No organomegaly or masses felt. Normal bowel sounds heard. Central nervous system: Alert and oriented.  She denies left face numbness at this time, no facial droop.  Right upper extremities 5 out of 5, left upper and lower extremity 4 out of 5. Extremities: Symmetric 5 x 5 power.  On the right, on the left 4 out of 5. Skin: No rashes, lesions or ulcers Psychiatry: Judgement and insight appear normal. Mood & affect appropriate.     Data Reviewed: I have personally reviewed following labs and imaging studies  CBC: Recent Labs  Lab 09/22/17 1430  09/22/17 1431 09/23/17 0415  WBC 9.6  --  7.5  NEUTROABS 5.9  --   --   HGB 11.3* 11.9* 11.3*  HCT 37.3 35.0* 36.7  MCV 88.4  --  87.4  PLT 315  --  161   Basic Metabolic Panel: Recent Labs  Lab 09/22/17 1430 09/22/17 1431 09/23/17 0415  NA 140 139 140  K 3.3* 3.3* 3.4*  CL 109 107 106  CO2 22  --  24  GLUCOSE 167* 158* 117*  BUN 10 8 6*  CREATININE 0.65 0.60 0.63  CALCIUM 9.0  --  8.9   GFR: Estimated Creatinine Clearance: 60.1 mL/min (by C-G formula based on SCr of 0.63 mg/dL). Liver Function Tests: Recent Labs  Lab 09/22/17 1430 09/23/17 0415  AST 18 19  ALT 15 16  ALKPHOS 106 92  BILITOT 0.2* 0.4  PROT 6.0* 5.7*  ALBUMIN 3.4* 3.2*   No results for input(s): LIPASE, AMYLASE in the last 168 hours. No results for input(s): AMMONIA in the last 168 hours. Coagulation Profile: Recent Labs  Lab 09/22/17 1430  INR  1.00   Cardiac Enzymes: No results for input(s): CKTOTAL, CKMB, CKMBINDEX, TROPONINI in the last 168 hours. BNP (last 3 results) No results for input(s): PROBNP in the last 8760 hours. HbA1C: Recent Labs    09/23/17 0415  HGBA1C 6.4*   CBG: Recent Labs  Lab 09/22/17 1417 09/22/17 2347 09/23/17 0421 09/23/17 0744 09/23/17 1211  GLUCAP 170* 93 115* 123* 145*   Lipid Profile: Recent Labs    09/23/17 0415  CHOL 149  HDL 41  LDLCALC 73  TRIG 177*  CHOLHDL 3.6   Thyroid Function Tests: No results for input(s): TSH, T4TOTAL, FREET4, T3FREE, THYROIDAB in the last 72 hours. Anemia Panel: No results for input(s): VITAMINB12, FOLATE, FERRITIN, TIBC, IRON, RETICCTPCT in the last 72 hours. Sepsis Labs: No results for input(s): PROCALCITON, LATICACIDVEN in the last 168 hours.  No results found for this or any previous visit (from the past 240 hour(s)).       Radiology Studies: Ct Angio Head W Or Wo Contrast  Addendum Date: 09/22/2017   ADDENDUM REPORT: 09/22/2017 15:14 ADDENDUM: 7 mm enhancing right parasagittal mass unchanged from  prior studies consistent with meningioma. Electronically Signed   By: Franchot Gallo M.D.   On: 09/22/2017 15:14   Result Date: 09/22/2017 CLINICAL DATA:  Left-sided weakness.  Stroke. EXAM: CT ANGIOGRAPHY HEAD AND NECK TECHNIQUE: Multidetector CT imaging of the head and neck was performed using the standard protocol during bolus administration of intravenous contrast. Multiplanar CT image reconstructions and MIPs were obtained to evaluate the vascular anatomy. Carotid stenosis measurements (when applicable) are obtained utilizing NASCET criteria, using the distal internal carotid diameter as the denominator. CONTRAST:  28m ISOVUE-370 IOPAMIDOL (ISOVUE-370) INJECTION 76% COMPARISON:  Numerous prior imaging studies. CT head today. MRA head 007/22/2019 CTA 09/16/2015, MRI head with contrast 04/25/2014 FINDINGS: CTA NECK FINDINGS Aortic arch: Atherosclerotic aortic arch without aneurysm. Atherosclerotic disease proximal great vessels without significant stenosis. Right carotid system: Atherosclerotic calcification in the right common carotid artery. Atherosclerotic plaque at the right carotid bifurcation and proximal right internal carotid artery. Less than 25% diameter stenosis right internal carotid artery Left carotid system: Mild atherosclerotic calcification in the left common carotid artery. Mild atherosclerotic calcification left carotid bifurcation without significant stenosis. Vertebral arteries: Moderate stenosis origin of the vertebral artery bilaterally due to calcific stenosis. Both vertebral arteries are patent to the basilar without additional stenosis. Skeleton: Negative Other neck: Negative for mass or adenopathy. Upper chest: Lung apices are clear aside from a calcified granuloma left upper lobe. Review of the MIP images confirms the above findings CTA HEAD FINDINGS Anterior circulation: Mild atherosclerotic calcification in the cavernous carotid bilaterally without significant stenosis. Anterior  and middle cerebral arteries patent bilaterally without stenosis. Enhancing lesion in the left cavernous sinus medial to the cavernous carotid. This shows homogeneous enhancement and has rounded contours measuring approximately 8 x 14 mm. The enhancement is significantly less than arterial enhancement and is slightly greater than venous enhancement. Therefore, this is not felt to be an aneurysm. This has been present on prior studies and showed enhancement on the MRI 2016. This has been followed as a probable meningioma. No change since the prior MRA of 09/16/2015 Posterior circulation: Both vertebral arteries are widely patent to the basilar. PICA AICA superior cerebellar and posterior cerebral arteries patent bilaterally. Fetal origin of the posterior cerebral artery bilaterally with relatively small basilar, normal variation. Venous sinuses: Patent Anatomic variants: None Delayed phase: Not performed Review of the MIP images confirms the above findings IMPRESSION:  1. Negative for emergent large vessel occlusion 2. Atherosclerotic disease aortic arch and carotid bifurcation bilaterally without significant stenosis. Moderate stenosis origin of both vertebral artery due to calcific disease. 3. Enhancing mass in the left cavernous sinus is stable from prior studies and may represent a meningioma measuring 8 x 14 mm. Electronically Signed: By: Franchot Gallo M.D. On: 09/22/2017 15:05   Ct Angio Neck W Or Wo Contrast  Addendum Date: 09/22/2017   ADDENDUM REPORT: 09/22/2017 15:14 ADDENDUM: 7 mm enhancing right parasagittal mass unchanged from prior studies consistent with meningioma. Electronically Signed   By: Franchot Gallo M.D.   On: 09/22/2017 15:14   Result Date: 09/22/2017 CLINICAL DATA:  Left-sided weakness.  Stroke. EXAM: CT ANGIOGRAPHY HEAD AND NECK TECHNIQUE: Multidetector CT imaging of the head and neck was performed using the standard protocol during bolus administration of intravenous contrast.  Multiplanar CT image reconstructions and MIPs were obtained to evaluate the vascular anatomy. Carotid stenosis measurements (when applicable) are obtained utilizing NASCET criteria, using the distal internal carotid diameter as the denominator. CONTRAST:  55m ISOVUE-370 IOPAMIDOL (ISOVUE-370) INJECTION 76% COMPARISON:  Numerous prior imaging studies. CT head today. MRA head 007-21-2019 CTA 09/16/2015, MRI head with contrast 04/25/2014 FINDINGS: CTA NECK FINDINGS Aortic arch: Atherosclerotic aortic arch without aneurysm. Atherosclerotic disease proximal great vessels without significant stenosis. Right carotid system: Atherosclerotic calcification in the right common carotid artery. Atherosclerotic plaque at the right carotid bifurcation and proximal right internal carotid artery. Less than 25% diameter stenosis right internal carotid artery Left carotid system: Mild atherosclerotic calcification in the left common carotid artery. Mild atherosclerotic calcification left carotid bifurcation without significant stenosis. Vertebral arteries: Moderate stenosis origin of the vertebral artery bilaterally due to calcific stenosis. Both vertebral arteries are patent to the basilar without additional stenosis. Skeleton: Negative Other neck: Negative for mass or adenopathy. Upper chest: Lung apices are clear aside from a calcified granuloma left upper lobe. Review of the MIP images confirms the above findings CTA HEAD FINDINGS Anterior circulation: Mild atherosclerotic calcification in the cavernous carotid bilaterally without significant stenosis. Anterior and middle cerebral arteries patent bilaterally without stenosis. Enhancing lesion in the left cavernous sinus medial to the cavernous carotid. This shows homogeneous enhancement and has rounded contours measuring approximately 8 x 14 mm. The enhancement is significantly less than arterial enhancement and is slightly greater than venous enhancement. Therefore, this is not  felt to be an aneurysm. This has been present on prior studies and showed enhancement on the MRI 2016. This has been followed as a probable meningioma. No change since the prior MRA of 09/16/2015 Posterior circulation: Both vertebral arteries are widely patent to the basilar. PICA AICA superior cerebellar and posterior cerebral arteries patent bilaterally. Fetal origin of the posterior cerebral artery bilaterally with relatively small basilar, normal variation. Venous sinuses: Patent Anatomic variants: None Delayed phase: Not performed Review of the MIP images confirms the above findings IMPRESSION: 1. Negative for emergent large vessel occlusion 2. Atherosclerotic disease aortic arch and carotid bifurcation bilaterally without significant stenosis. Moderate stenosis origin of both vertebral artery due to calcific disease. 3. Enhancing mass in the left cavernous sinus is stable from prior studies and may represent a meningioma measuring 8 x 14 mm. Electronically Signed: By: CFranchot GalloM.D. On: 09/22/2017 15:05   [name]  Result Date: 09/22/2017 CLINICAL DATA:  Initial evaluation for acute left-sided facial droop with left facial weakness. EXAM: MRI HEAD WITHOUT CONTRAST TECHNIQUE: Multiplanar, multiecho pulse sequences of the brain  and surrounding structures were obtained without intravenous contrast. COMPARISON:  Prior CTA from earlier the same day as well as previous MRI from 07/30/2017 FINDINGS: Brain: Examination degraded by motion artifact. Generalized age-related cerebral atrophy, stable. Extensive patchy and confluent T2/FLAIR hyperintensity within the periventricular and deep white matter both cerebral hemispheres, most consistent with chronic small vessel ischemic disease, also similar to previous. Encephalomalacia with gliosis involving the left temporal occipital region with associated chronic susceptibility artifact, stable from previous. Additional small remote bilateral cerebellar  infarcts noted. Chronic right thalamic lacunar infarct No abnormal foci of restricted diffusion to suggest acute or subacute ischemia. Gray-white matter differentiation otherwise maintained. No acute intracranial hemorrhage. 1 cm meningioma at the posterior right cerebral convexity without significant mass effect, stable. No other mass lesion. No mass effect or midline shift. No hydrocephalus. No extra-axial fluid collection. Pituitary gland within normal limits. Vascular: Major intracranial vascular flow voids maintained. Skull and upper cervical spine: Craniocervical junction normal. Upper cervical spine within normal limits. Bone marrow signal intensity normal. No scalp soft tissue abnormality. Sinuses/Orbits: Globes and orbital soft tissues within normal limits. Paranasal sinuses are clear. No mastoid effusion. Inner ear structures normal. Other: None. IMPRESSION: 1. No acute intracranial abnormality. 2. Chronic posterior left MCA territory infarct, with remote right thalamic lacunar infarct and old bilateral cerebellar infarcts. 3. Atrophy with moderate chronic small vessel ischemic disease, stable. Electronically Signed   By: Jeannine Boga M.D.   On: 09/22/2017 19:58   Ct Head Code Stroke Wo Contrast  Result Date: 09/22/2017 CLINICAL DATA:  Code stroke.  Left leg weakness EXAM: CT HEAD WITHOUT CONTRAST TECHNIQUE: Contiguous axial images were obtained from the base of the skull through the vertex without intravenous contrast. COMPARISON:  08/06/2017 FINDINGS: Brain: Moderate remote infarct in the left cerebrum along the parietal lobe reaching into the occipital cortex. Small remote bilateral cerebellar infarcts. Remote lacunar infarct in the right caudate head. Ischemic gliosis in the cerebral white matter. No hemorrhage, hydrocephalus, or intra-axial masslike finding. There is a partially calcified extra-axial mass the high right frontal convexity measuring 1 cm. Vascular: No hyperdense vessel.  Skull: No acute finding Sinuses/Orbits: Negative Other: These results were communicated to Dr. Rory Percy at 2:30 pmon 9/10/2019by text page via the [facility] messaging system. ASPECTS [facility] Stroke Program Early CT Score) -right hemisphere - Ganglionic level infarction (caudate, lentiform nuclei, internal capsule, insula, M1-M3 cortex): 7 - Supraganglionic infarction (M4-M6 cortex): 3 Total score (0-10 with 10 being normal): 10 IMPRESSION: 1. No acute finding.  ASPECTS is 10 in the right hemisphere. 2. Moderate remote left parietooccipital infarct. Remote small vessel infarcts in the infra and supratentorial brain. 3. 1 cm calcified meningioma along the right frontal convexity. Electronically Signed   By: Monte Fantasia M.D.   On: 09/22/2017 14:32        Scheduled Meds: . aspirin  325 mg Oral Daily  . atorvastatin  80 mg Oral q1800  . DULoxetine  60 mg Oral QHS  . feeding supplement (GLUCERNA SHAKE)  237 mL Oral BID BM  . ferrous sulfate  325 mg Oral Q breakfast  . fluticasone  1 spray Each Nare Daily  . gabapentin  800 mg Oral BID  . heparin  5,000 Units Subcutaneous Q8H  . [START ON 09/24/2017] Influenza vac split quadrivalent PF  0.5 mL Intramuscular Tomorrow-1000  . insulin aspart  0-9 Units Subcutaneous TID WC  . levETIRAcetam  500 mg Oral BID  . levothyroxine  112 mcg Oral QAC breakfast  .  lisinopril  20 mg Oral Daily  . pantoprazole  40 mg Oral BID  . sucralfate  1 g Oral TID WC & HS  . umeclidinium bromide  1 puff Inhalation Daily  . vitamin B-12  1,000 mcg Oral Daily   Continuous Infusions:   LOS: 0 days     Eulogio Bear DO [facility]   If 7PM-7AM, please contact night-coverage [email or web address] Password [facility] 09/23/2017, 3:43 PM

## 2017-09-23 NOTE — Care Management Obs Status (Signed)
[place] NOTIFICATION   Patient Details  Name: Samantha Aguirre MRN: [id number] Date of Birth: 1945-06-27   Medicare Observation Status Notification Given:  Yes    Pollie Friar, RN 09/23/2017, 3:07 PM

## 2017-09-23 NOTE — Evaluation (Signed)
Speech Language Pathology Evaluation Patient Details Name: Samantha Aguirre MRN: [id number] DOB: 03-10-1945 Today's Date: 09/23/2017 Time: 4650-3546 SLP Time Calculation (min) (ACUTE ONLY): 20 min  Problem List:  Patient Active Problem List   Diagnosis Date Noted  . TIA (transient ischemic attack) 09/22/2017  . PAF (paroxysmal atrial fibrillation) ([place])   . Acute CVA (cerebrovascular accident) ([place]) 07/30/2017  . GI bleed 07/08/2017  . Anemia 07/08/2017  . Angiodysplasia of stomach and duodenum with hemorrhage   . CVA (cerebral vascular accident) ([place]) 07/06/2017  . Stroke (cerebrum) ([place])   . Goals of care, counseling/discussion   . Palliative care encounter   . COPD exacerbation ([place]) 04/03/2017  . Flu 03/27/2017  . Iron deficiency anemia due to chronic blood loss   . Migraine 02/13/2017  . Cataracts, bilateral 01/15/2017  . Chronic venous insufficiency 10/06/2016  . Recurrent major depressive disorder, in partial remission ([place]) 10/06/2016  . Left-sided weakness 10/03/2016  . Chest pain 02/27/2016  . History of stroke 02/19/2016  . Thumb pain, left 01/22/2016  . Traumatic closed nondisplaced fracture of base of metacarpal bone of left thumb 01/22/2016  . Colon cancer screening 11/27/2015  . Depression 11/14/2015  . Coronary artery disease 11/13/2015  . Hypertension 11/13/2015  . History of skin cancer 11/13/2015  . Osteoporosis 11/13/2015  . Chronic low back pain 11/13/2015  . Left medial knee pain 11/13/2015  . Osteoarthritis of multiple joints 11/13/2015  . Other specified hypothyroidism 08/16/2015  . Controlled type 2 diabetes mellitus with diabetic neuropathy ([place]) 08/16/2015  . COPD (chronic obstructive pulmonary disease) ([place]) 08/16/2015  . Tobacco abuse 08/16/2015  . HLD (hyperlipidemia) 08/16/2015  . History of CHF (congestive heart failure) 08/16/2015  . Seizure disorder [facility])    Past Medical History:  Past Medical History:  Diagnosis Date  . Acid reflux    . Arthritis    Bilateral knees, hands, feet  . Asthma   . Atrial fibrillation ([place])   . Cancer ([place])   . CHF (congestive heart failure) ([facility])    Diastolic CHF  . COPD (chronic obstructive pulmonary disease) ([place])   . Coronary artery disease   . Diabetes mellitus without complication ([place])   . Frequent headaches   . GI bleed   . HLD (hyperlipidemia)   . Hypertension   . Seizures ([place])   . Skin cancer 2015   Suspected basal cell carcinoma on nose, surgically removed  . Stroke ([place]) 04/2017  . Thyroid disease   . Tremors of nervous system    Past Surgical History:  Past Surgical History:  Procedure Laterality Date  . ABDOMINAL HYSTERECTOMY  1976  . APPENDECTOMY  1980  . CAROTID ENDARTERECTOMY    . CHOLECYSTECTOMY  1980  . COLONOSCOPY N/A 02/20/2017   Procedure: COLONOSCOPY;  Surgeon: Lin Landsman, MD;  Location: [facility] ENDOSCOPY;  Service: Gastroenterology;  Laterality: N/A;  . ESOPHAGOGASTRODUODENOSCOPY N/A 02/20/2017   Procedure: ESOPHAGOGASTRODUODENOSCOPY (EGD);  Surgeon: Lin Landsman, MD;  Location: [facility] ENDOSCOPY;  Service: Gastroenterology;  Laterality: N/A;  . ESOPHAGOGASTRODUODENOSCOPY (EGD) WITH PROPOFOL N/A 07/08/2017   Procedure: ESOPHAGOGASTRODUODENOSCOPY (EGD) WITH PROPOFOL;  Surgeon: [name], MD;  Location: [facility] ENDOSCOPY;  Service: Endoscopy;  Laterality: N/A;  . [place]  . GIVENS CAPSULE STUDY N/A 07/09/2017   Procedure: GIVENS CAPSULE STUDY;  Surgeon: Jonathon Bellows, MD;  Location: [facility] ENDOSCOPY;  Service: Gastroenterology;  Laterality: N/A;  . KNEE SURGERY     left  . ROTATOR CUFF REPAIR     right  .  TONSILLECTOMY  1950   HPI:  Samantha Aguirre is a 72 y.o. female with a known history of recurrent CVA (she had an acute right thalamic infarct in March 2019, Rt MCA April 2019, June 2019 with acute internal capsule CVA on left, old Left MCA CVA, old right thalamic and cerebellar CVAs.  Now admitted with worsening left sided weakness and  worsening speech deficits per Samantha Aguirre.  She does state they have now resolved to baseline however.  PMH + for Tobacco Heavily per chart, atrial fibrillation on eliquis, severe iron deficiency anemia with local G.I. bleeding as for workup with colonoscopy and endoscopy in February 2019 on iron, vitamin B12 deficiency, COPD, tremors, coronary artery dx, tremors.   speech evaluation ordered.    Assessment / Plan / Recommendation Clinical Impression  Samantha Aguirre continues with nonfluent aphasia characterized by word finding deficits resulting in moderate dysfluency/pauses with expression - occuring in 60%of novel communication.  Samantha Aguirre admits to decreased left facial symmetry and right facial sensation but states this has resolved to baseline asymmetry since yesterday.  Language presentation is similar to prior admit.  Samantha Aguirre's understanding of language is intact for brief complex information with decreased comprehension of lengthier verbal or written info.  Reading outloud continues with mildly dysfluency however Samantha Aguirre does not have her glasses present.  She also continues to notice approximately 50% of errors and self corrects.  Comprehension of sentence level material - written and verbal - was intact.  Samantha Aguirre compensates well for her word finding deficits by circumlocution, or pausing.  Patient does not recall SlP providing packet of information/tasks during last hospital admit, therefoe provided her tasks to maximize her word finding abilities and again advised environmental adaptions (one or two visitors at a time) with no background noise to maximize communication.  Also advised to put novel verbal communicatioan to music- incorporating Melodic intonation to improve fluency.  Samantha Aguirre advised to read short stories from internet or even [employer] to practive comprehension/retention.    Again Samantha Aguirre is motivated to improve fluency - would recommend follow up with SLP addressing main goal of increasing fluency.  Reviewed compensation strategies  and all education completed using teach back. Thanks for this referral.     SLP Assessment  SLP Recommendation/Assessment: All further Speech Lanaguage Pathology  needs can be addressed in the next venue of care SLP Visit Diagnosis: Aphasia (R47.01)    Follow Up Recommendations  Home health SLP    Frequency and Duration           SLP Evaluation Cognition  Overall Cognitive Status: Within Functional Limits for tasks assessed Arousal/Alertness: Awake/alert Orientation Level: Oriented X4 Attention: Alternating Focused Attention: Appears intact Alternating Attention: Appears intact Memory: Appears intact(recalled SLP and prior medical events) Awareness: Appears intact(aware of speech dysfluency and frustration) Problem Solving: Appears intact Safety/Judgment: Appears intact       Comprehension  Auditory Comprehension Overall Auditory Comprehension: Appears within functional limits for tasks assessed Yes/No Questions: Not tested Commands: Within Functional Limits Conversation: Complex Visual Recognition/Discrimination Discrimination: Not tested Reading Comprehension Reading Status: Within funtional limits    Expression Expression Primary Mode of Expression: Verbal Verbal Expression Overall Verbal Expression: Impaired at baseline(but exacerbated with this event) Initiation: Impaired Automatic Speech: Name;Social Response Level of Generative/Spontaneous Verbalization: Conversation Repetition: Impaired Level of Impairment: Word level(multi syllabic more difficult) Naming: Not tested Pragmatics: No impairment Non-Verbal Means of Communication: Not applicable(Samantha Aguirre reports difficulty writing so unable to write legibly for communication ) Written Expression Dominant Hand: Right Written Expression: Not  tested(due to Samantha Aguirre's tremors)   Oral / Motor  Oral Motor/Sensory Function Overall Oral Motor/Sensory Function: Mild impairment(mild facial asymmetry, lingual deviation to left  slight) Facial Symmetry: Abnormal symmetry left Lingual Strength: Suspected CN XII (hypoglossal) dysfunction;Reduced Velum: Within Functional Limits Mandible: (nt) Motor Speech Overall Motor Speech: Impaired Respiration: Impaired Level of Impairment: Phrase Phonation: Hoarse Resonance: Within functional limits Articulation: Within functional limitis Intelligibility: Intelligible Motor Planning: Impaired Level of Impairment: Phrase Motor Speech Errors: Groping for words;Inconsistent Interfering Components: Premorbid status Effective Techniques: Pause   GO                    Macario Golds 09/23/2017, 12:26 PM  Luanna Salk, [facility] SLP [phone]

## 2017-09-23 NOTE — Patient Outreach (Signed)
[facility]) Care Management  09/23/2017  Samantha Aguirre Mar 29, 1945 [id number]   Care Coordination   Received in basket message from [place], [facility] hospital Liaison, patient with observation admission on 9/11.  Plan Will await update regarding patient discharge disposition .   [name], RN, [place] Management Coordinator  [phone]- Mobile [phone]- Toll Free Main Office

## 2017-09-23 NOTE — Progress Notes (Addendum)
STROKE TEAM PROGRESS NOTE   INTERVAL HISTORY No family is at the bedside.  Patient reports she still has left hemiparesis and left facial numbness.  MRI negative.  Patient with history of chronic daily headaches.  States headache was 9 out of 10 yesterday.  She took no treatment for it.  Headache occurred prior to neurologic symptoms.  She has had stroke in the past.  Watchman has been recommended to her, but she has not had follow-up evaluation. She is not sure why.   Vitals:   09/23/17 0743 09/23/17 1215 09/23/17 1523 09/23/17 1631  BP: (!) 117/56 115/61 113/69 (!) 98/55  Pulse: (!) 48 60 (!) 54 (!) 52  Resp: 20 20 16 20   Temp: 98.5 F (36.9 C) 97.6 F (36.4 C) 98.4 F (36.9 C) 98.4 F (36.9 C)  TempSrc: Oral Oral Oral Oral  SpO2: 98% 95% 94% 97%  Weight:      Height:        CBC:  Recent Labs  Lab 09/22/17 1430 09/22/17 1431 09/23/17 0415  WBC 9.6  --  7.5  NEUTROABS 5.9  --   --   HGB 11.3* 11.9* 11.3*  HCT 37.3 35.0* 36.7  MCV 88.4  --  87.4  PLT 315  --  941    Basic Metabolic Panel:  Recent Labs  Lab 09/22/17 1430 09/22/17 1431 09/23/17 0415  NA 140 139 140  K 3.3* 3.3* 3.4*  CL 109 107 106  CO2 22  --  24  GLUCOSE 167* 158* 117*  BUN 10 8 6*  CREATININE 0.65 0.60 0.63  CALCIUM 9.0  --  8.9   Lipid Panel:     Component Value Date/Time   CHOL 149 09/23/2017 0415   TRIG 177 (H) 09/23/2017 0415   HDL 41 09/23/2017 0415   CHOLHDL 3.6 09/23/2017 0415   VLDL 35 09/23/2017 0415   LDLCALC 73 09/23/2017 0415   HgbA1c:  Lab Results  Component Value Date   HGBA1C 6.4 (H) 09/23/2017   Urine Drug Screen:     Component Value Date/Time   LABOPIA NONE DETECTED 09/22/2017 1940   COCAINSCRNUR NONE DETECTED 09/22/2017 1940   COCAINSCRNUR NONE DETECTED 07/06/2017 1114   LABBENZ NONE DETECTED 09/22/2017 1940   AMPHETMU NONE DETECTED 09/22/2017 1940   THCU NONE DETECTED 09/22/2017 1940   LABBARB NONE DETECTED 09/22/2017 1940    Alcohol Level     Component  Value Date/Time   ETH <10 09/22/2017 1430    IMAGING Ct Angio Head W Or Wo Contrast  Addendum Date: 09/22/2017   ADDENDUM REPORT: 09/22/2017 15:14 ADDENDUM: 7 mm enhancing right parasagittal mass unchanged from prior studies consistent with meningioma. Electronically Signed   By: Franchot Gallo M.D.   On: 09/22/2017 15:14   Result Date: 09/22/2017 CLINICAL DATA:  Left-sided weakness.  Stroke. EXAM: CT ANGIOGRAPHY HEAD AND NECK TECHNIQUE: Multidetector CT imaging of the head and neck was performed using the standard protocol during bolus administration of intravenous contrast. Multiplanar CT image reconstructions and MIPs were obtained to evaluate the vascular anatomy. Carotid stenosis measurements (when applicable) are obtained utilizing NASCET criteria, using the distal internal carotid diameter as the denominator. CONTRAST:  71mL ISOVUE-370 IOPAMIDOL (ISOVUE-370) INJECTION 76% COMPARISON:  Numerous prior imaging studies. CT head today. MRA head August 24, 2017. CTA 09/16/2015, MRI head with contrast 04/25/2014 FINDINGS: CTA NECK FINDINGS Aortic arch: Atherosclerotic aortic arch without aneurysm. Atherosclerotic disease proximal great vessels without significant stenosis. Right carotid system: Atherosclerotic calcification in the right common  carotid artery. Atherosclerotic plaque at the right carotid bifurcation and proximal right internal carotid artery. Less than 25% diameter stenosis right internal carotid artery Left carotid system: Mild atherosclerotic calcification in the left common carotid artery. Mild atherosclerotic calcification left carotid bifurcation without significant stenosis. Vertebral arteries: Moderate stenosis origin of the vertebral artery bilaterally due to calcific stenosis. Both vertebral arteries are patent to the basilar without additional stenosis. Skeleton: Negative Other neck: Negative for mass or adenopathy. Upper chest: Lung apices are clear aside from a calcified granuloma left  upper lobe. Review of the MIP images confirms the above findings CTA HEAD FINDINGS Anterior circulation: Mild atherosclerotic calcification in the cavernous carotid bilaterally without significant stenosis. Anterior and middle cerebral arteries patent bilaterally without stenosis. Enhancing lesion in the left cavernous sinus medial to the cavernous carotid. This shows homogeneous enhancement and has rounded contours measuring approximately 8 x 14 mm. The enhancement is significantly less than arterial enhancement and is slightly greater than venous enhancement. Therefore, this is not felt to be an aneurysm. This has been present on prior studies and showed enhancement on the MRI 2016. This has been followed as a probable meningioma. No change since the prior MRA of 09/16/2015 Posterior circulation: Both vertebral arteries are widely patent to the basilar. PICA AICA superior cerebellar and posterior cerebral arteries patent bilaterally. Fetal origin of the posterior cerebral artery bilaterally with relatively small basilar, normal variation. Venous sinuses: Patent Anatomic variants: None Delayed phase: Not performed Review of the MIP images confirms the above findings IMPRESSION: 1. Negative for emergent large vessel occlusion 2. Atherosclerotic disease aortic arch and carotid bifurcation bilaterally without significant stenosis. Moderate stenosis origin of both vertebral artery due to calcific disease. 3. Enhancing mass in the left cavernous sinus is stable from prior studies and may represent a meningioma measuring 8 x 14 mm. Electronically Signed: By: Franchot Gallo M.D. On: 09/22/2017 15:05   Ct Angio Neck W Or Wo Contrast  Addendum Date: 09/22/2017   ADDENDUM REPORT: 09/22/2017 15:14 ADDENDUM: 7 mm enhancing right parasagittal mass unchanged from prior studies consistent with meningioma. Electronically Signed   By: Franchot Gallo M.D.   On: 09/22/2017 15:14   Result Date: 09/22/2017 CLINICAL DATA:   Left-sided weakness.  Stroke. EXAM: CT ANGIOGRAPHY HEAD AND NECK TECHNIQUE: Multidetector CT imaging of the head and neck was performed using the standard protocol during bolus administration of intravenous contrast. Multiplanar CT image reconstructions and MIPs were obtained to evaluate the vascular anatomy. Carotid stenosis measurements (when applicable) are obtained utilizing NASCET criteria, using the distal internal carotid diameter as the denominator. CONTRAST:  15mL ISOVUE-370 IOPAMIDOL (ISOVUE-370) INJECTION 76% COMPARISON:  Numerous prior imaging studies. CT head today. MRA head 2017/08/09. CTA 09/16/2015, MRI head with contrast 04/25/2014 FINDINGS: CTA NECK FINDINGS Aortic arch: Atherosclerotic aortic arch without aneurysm. Atherosclerotic disease proximal great vessels without significant stenosis. Right carotid system: Atherosclerotic calcification in the right common carotid artery. Atherosclerotic plaque at the right carotid bifurcation and proximal right internal carotid artery. Less than 25% diameter stenosis right internal carotid artery Left carotid system: Mild atherosclerotic calcification in the left common carotid artery. Mild atherosclerotic calcification left carotid bifurcation without significant stenosis. Vertebral arteries: Moderate stenosis origin of the vertebral artery bilaterally due to calcific stenosis. Both vertebral arteries are patent to the basilar without additional stenosis. Skeleton: Negative Other neck: Negative for mass or adenopathy. Upper chest: Lung apices are clear aside from a calcified granuloma left upper lobe. Review of the MIP images confirms the  above findings CTA HEAD FINDINGS Anterior circulation: Mild atherosclerotic calcification in the cavernous carotid bilaterally without significant stenosis. Anterior and middle cerebral arteries patent bilaterally without stenosis. Enhancing lesion in the left cavernous sinus medial to the cavernous carotid. This shows  homogeneous enhancement and has rounded contours measuring approximately 8 x 14 mm. The enhancement is significantly less than arterial enhancement and is slightly greater than venous enhancement. Therefore, this is not felt to be an aneurysm. This has been present on prior studies and showed enhancement on the MRI 2016. This has been followed as a probable meningioma. No change since the prior MRA of 09/16/2015 Posterior circulation: Both vertebral arteries are widely patent to the basilar. PICA AICA superior cerebellar and posterior cerebral arteries patent bilaterally. Fetal origin of the posterior cerebral artery bilaterally with relatively small basilar, normal variation. Venous sinuses: Patent Anatomic variants: None Delayed phase: Not performed Review of the MIP images confirms the above findings IMPRESSION: 1. Negative for emergent large vessel occlusion 2. Atherosclerotic disease aortic arch and carotid bifurcation bilaterally without significant stenosis. Moderate stenosis origin of both vertebral artery due to calcific disease. 3. Enhancing mass in the left cavernous sinus is stable from prior studies and may represent a meningioma measuring 8 x 14 mm. Electronically Signed: By: Franchot Gallo M.D. On: 09/22/2017 15:05   [name]  Result Date: 09/22/2017 CLINICAL DATA:  Initial evaluation for acute left-sided facial droop with left facial weakness. EXAM: MRI HEAD WITHOUT CONTRAST TECHNIQUE: Multiplanar, multiecho pulse sequences of the brain and surrounding structures were obtained without intravenous contrast. COMPARISON:  Prior CTA from earlier the same day as well as previous MRI from 07/30/2017 FINDINGS: Brain: Examination degraded by motion artifact. Generalized age-related cerebral atrophy, stable. Extensive patchy and confluent T2/FLAIR hyperintensity within the periventricular and deep white matter both cerebral hemispheres, most consistent with chronic small vessel ischemic  disease, also similar to previous. Encephalomalacia with gliosis involving the left temporal occipital region with associated chronic susceptibility artifact, stable from previous. Additional small remote bilateral cerebellar infarcts noted. Chronic right thalamic lacunar infarct No abnormal foci of restricted diffusion to suggest acute or subacute ischemia. Gray-white matter differentiation otherwise maintained. No acute intracranial hemorrhage. 1 cm meningioma at the posterior right cerebral convexity without significant mass effect, stable. No other mass lesion. No mass effect or midline shift. No hydrocephalus. No extra-axial fluid collection. Pituitary gland within normal limits. Vascular: Major intracranial vascular flow voids maintained. Skull and upper cervical spine: Craniocervical junction normal. Upper cervical spine within normal limits. Bone marrow signal intensity normal. No scalp soft tissue abnormality. Sinuses/Orbits: Globes and orbital soft tissues within normal limits. Paranasal sinuses are clear. No mastoid effusion. Inner ear structures normal. Other: None. IMPRESSION: 1. No acute intracranial abnormality. 2. Chronic posterior left MCA territory infarct, with remote right thalamic lacunar infarct and old bilateral cerebellar infarcts. 3. Atrophy with moderate chronic small vessel ischemic disease, stable. Electronically Signed   By: Jeannine Boga M.D.   On: 09/22/2017 19:58   Ct Head Code Stroke Wo Contrast  Result Date: 09/22/2017 CLINICAL DATA:  Code stroke.  Left leg weakness EXAM: CT HEAD WITHOUT CONTRAST TECHNIQUE: Contiguous axial images were obtained from the base of the skull through the vertex without intravenous contrast. COMPARISON:  08/06/2017 FINDINGS: Brain: Moderate remote infarct in the left cerebrum along the parietal lobe reaching into the occipital cortex. Small remote bilateral cerebellar infarcts. Remote lacunar infarct in the right caudate head. Ischemic gliosis  in the cerebral white matter.  No hemorrhage, hydrocephalus, or intra-axial masslike finding. There is a partially calcified extra-axial mass the high right frontal convexity measuring 1 cm. Vascular: No hyperdense vessel. Skull: No acute finding Sinuses/Orbits: Negative Other: These results were communicated to Dr. Rory Percy at 2:30 pmon 9/10/2019by text page via the [facility] messaging system. ASPECTS [facility] Stroke Program Early CT Score) -right hemisphere - Ganglionic level infarction (caudate, lentiform nuclei, internal capsule, insula, M1-M3 cortex): 7 - Supraganglionic infarction (M4-M6 cortex): 3 Total score (0-10 with 10 being normal): 10 IMPRESSION: 1. No acute finding.  ASPECTS is 10 in the right hemisphere. 2. Moderate remote left parietooccipital infarct. Remote small vessel infarcts in the infra and supratentorial brain. 3. 1 cm calcified meningioma along the right frontal convexity. Electronically Signed   By: Monte Fantasia M.D.   On: 09/22/2017 14:32   2D Echocardiogram  - Left ventricle: The cavity size was normal. Wall thickness was   increased in a pattern of mild LVH. Systolic function was   vigorous. The estimated ejection fraction was in the range of 65%   to 70%. Wall motion was normal; there were no regional wall   motion abnormalities. Left ventricular diastolic function   parameters were normal. - Aortic valve: There was no stenosis. - Mitral valve: There was no significant regurgitation. - Right ventricle: The cavity size was normal. Systolic function   was normal. - Pulmonary arteries: No complete TR doppler jet so unable to   estimate PA systolic pressure. - Systemic veins: IVC measured 2.3 cm with > 50% respirophasic   variation, suggesting RA pressure 8 mmHg.  Impressions:  - Normal LV size with mild LV hypertrophy. EF 65-70%. Normal   diastolic function. Normal RV size and systolic function. No   significant valvular abnormalities.   PHYSICAL EXAM HEENT-   Normocephalic, no lesions, without obvious abnormality.  Normal external eye and conjunctiva.  Cardiovascular- S1-S2 audible, pulses palpable throughout   Lungs-no rhonchi or wheezing noted, no excessive working breathing.  Saturations within normal limits on RA Extremities- Warm, dry and intact Musculoskeletal- left knee tender and  Slightly edematous Skin-warm and dry  Neurological Examination Mental Status: Alert, oriented, thought content appropriate.  Speech fluent without evidence of aphasia.  Able to follow  commands without difficulty. Cranial Nerves: II: ; Visual fields grossly normal,  III,IV, VI: ptosis not present, extra-ocular motions intact bilaterally, pupils equal, round, reactive to light and accommodation V,VII: smile asymmetric left facial droop, facial light touch sensation intact, left face is numb and has less sensation than right side of face.  VIII: hearing normal bilaterally IX,X: uvula rises symmetrically XI: bilateral shoulder shrug XII: midline tongue extension Motor: Right :  Upper extremity   5/5                                      Left:     Upper extremity   4 -/5             Lower extremity   5/5                                                  Lower extremity   4+/5 Tone and bulk:normal tone throughout; no atrophy noted Tremor of left arm noted when arms are held in  air greater than 5 seconds. Sensory:  light touch intact throughout, bilaterally lantars: Right: downgoing                                Left: downgoing Cerebellar:  normal finger-to-nose, normal heel-to-shin test Gait: deferred    ASSESSMENT/PLAN Ms. Margurete Guaman is a 72 y.o. female with history of atrial fibrillation not on anticoagulation, previous stroke with resultant right hemiparesis, CHF, coronary artery disease, diabetes, hypertension, seizures on Keppra, GI bleed presenting with left facial numbness and left hemiparesis.   Possible TIA  Code Stroke CT head No acute  stroke. Old L parietooccipital, and bilateral Small vessel disease infarcts. R frontal 1cm meningioma. ASPECTS 10.     CTA head & neck right parasagittal meningioma, stable.  No ELVO.  Atherosclerotic disease aortic arch and back carotid bifurcation.  MRI no acute stroke.  Old left MCA infarct.  Old right thalamic lacunar.  Old bilateral cerebellar infarcts.  Atrophy.  Moderate small vessel disease.  2D Echo  EF 65-70%. No source of embolus   LDL 73  HgbA1c 6.4  Heparin 5000 units sq tid for VTE prophylaxis  aspirin 325 mg daily prior to admission, now on aspirin 325 mg daily. Continue aspirin.  Therapy recommendations:  SNF  Disposition:  pending   Recommend followup watchman device at [facility] at d/c  GIB   07/11/17 admisstion to [facility] "GIB/chronic blood loss anemia:History of recent anemia requiring transfusions during 2 different hospitalizations. EGD/colonoscopy initially notable earlier in 2019 for colonic polyps which were removed. EGD this week notable for AVMs which were treated with local therapy. Follow-up capsule endoscopy notable for active small bowel bleeding and nonbleeding AVMs. Patient was transferred to [facility] for evaluation for possible balloon enteroscopy. GI did review disc with capsule enteroscopy images. Noted proximal small bowel lesions that appeared to be ulcers with oozing per GI. They feel these lesions could be amenable to treatment with push enteroscopy. The polyps noted on capsule appeared to be submucosal lesions possibly lipomas, that are likely not a bleeding source. Noted more distal AVMs that are nonbleeding. Hgb stable ~ 8.5. GI was consulted; s/p enteroscopy today; noted one non-bleeding jejunal ulcer with no stigmata of bleeding.A single non-bleeding angiodysplastic lesion in the jejunum. Treated with argon plasma coagulation (APC).An area in the proximal jejunum was tattooed. She will continue PPI and sucralfate per outside GI recommendations. Ok to  resume ASA in 3 days (continue to hold apixaban) if no further bleeding. We were able to advance her diet and she tolerated a regular diet prior to DC."  Discussion with her GI physician last admission, apparently very high risk for GI bleeding on any antiplatelet or anticoagulation  As long discussion with patient last admission, given her several choices for antiplatelet or anticoagulation, she would like to be referred to [facility] or [place] cardiology for watchman device  History of stroke  With residual right-sided weakness  08/2015, worsening right-sided weakness, MRI negative, consider recrudescence  02/2016, left facial droop and left-sided weakness, MRI/MRA negative, found to have A. fib, put on Eliquis  04/2016, headache, imbalance, right eye pain, facial droop, MRI negative  03/2017, left facial droop and left-sided weakness, MRI showed right thalamic and left temporal infarcts.  MRI showed bilateral M2/M3, right PCA atherosclerosis.  06/2017, left-sided weakness, MRI negative for acute stroke  History of seizure  On home Keppra  PAF off Eliquis  On aspirin PTA due to  GI bleeding  Now on aspirin 325  Not an AC candidate d/t high risk GI bleeding  Refer to [facility] or [place] cardiology for watchman device  Hypertension  Stable . BP goal normotensive  Hyperlipidemia  Home meds:  lipitor 80, resumed in hospital  LDL 73, goal < 70  Continue statin at discharge  Diabetes type II with neuropathy  HgbA1c 6.4, at goal < 7.0  SSI  neurontin  Tobacco abuse  Current smoker  Smoking cessation counseling provided  Pt is willing to quit  Other Stroke Risk Factors  Advanced age  Obesity, Body mass index is 33.49 kg/m., recommend weight loss, diet and exercise as appropriate   Family hx stroke (mother, father, sister)  Coronary artery disease  Other Active Problems  Anemia of chronic disease, Hgb 11.3  GERD on Protonix  Depression on  Cymbalta  Hypothyroidism on Synthroid  Daily chronic headaches  Hospital day # 0  Samantha Sabin, MSN, APRN, ANVP-BC, AGPCNP-BC Advanced Practice Stroke Nurse [place] for Schedule & Pager information 09/23/2017 5:21 PM   ATTENDING NOTE: I reviewed above note and agree with the assessment and plan. Pt was seen and examined.   72 year old female with history of strokes, A. fib not on AC due to GI bleeding, CHF, CAD, DM, HTN, GI bleeding, seizure on Keppra and headache admitted for left facial droop and left leg weakness.   Patient presented on 07/31/2017 for left facial droop and left-sided weakness and numbness.  However MRI only showed left internal capsule small infarct.  Carotid Doppler, MRA, 2D echo all unremarkable.  LDL 44 and A1c 5.6 UDS negative.  Discussed with patient GI doctor over the phone, apparently high risk for antiplatelet or anticoagulation.  Patient was put on aspirin 325 and Lipitor 80 on discharge with plan to have watchman device referral to [facility] cardiology.  However, for the last 2 months, patient has not been set up for watchman device yet.  This time patient had again acute onset left facial droop and left-sided weakness.  Symptoms gradually improved, and currently left facial droop resolved.  Patient denies any left arm weakness but complains of left leg weakness continued.  On exam, it appears that left leg weakness 3/5 largely because of left knee pain.  No hyperreflexia or Babinski sign or Hoffmann sign to indicate upper neuro disease or spinal cord compression.  MRI no acute abnormality, but old left parietal occipital infarct.  CTA head and neck unremarkable cerebral vasculature, except left cavernous sinus meningioma. EF 65 to 70%.  A1c 6.4 and LDL 73.  Patient episode of left facial droop concerning for TIA, however left leg weakness largely related to left knee pain, recommend to continue follow-up with orthopedic surgeon in [facility]  for left knee pain.  Given her history of A. fib not on anticoagulation due to GI bleeding, recommend to continue aspirin 325 at this time but need urgent referral to [facility] cardiology for watchman device consideration.  Continue Lipitor on discharge.  Neurology will sign off. Please call with questions. Pt will follow up with her neurologist Dr. Melrose Nakayama on 10/20/2017. Thanks for the consult.   Rosalin Hawking, MD PhD Stroke Neurology 09/23/2017 9:32 PM  I spent  35 minutes in total face-to-face time with the patient, more than 50% of which was spent in counseling and coordination of care, reviewing test results, images and medication, and discussing the diagnosis of recurrent TIA/stroke, A. fib not on anticoagulation, GI bleeding, treatment plan and  potential prognosis. This patient's care requiresreview of multiple databases, neurological assessment, discussion with family, other specialists and medical decision making of high complexity. I had long discussion with patient at bedside, updated pt current condition, treatment plan and potential prognosis. She expressed understanding and appreciation.          To contact Stroke Continuity provider, please refer to [email or web address] After hours, contact General Neurology

## 2017-09-23 NOTE — Progress Notes (Signed)
  Echocardiogram 2D Echocardiogram has been performed.  Samantha Aguirre 09/23/2017, 9:57 AM

## 2017-09-24 ENCOUNTER — Ambulatory Visit: Payer: Medicare Other

## 2017-09-24 ENCOUNTER — Inpatient Hospital Stay: Payer: Medicare Other | Admitting: Nurse Practitioner

## 2017-09-24 DIAGNOSIS — G40909 Epilepsy, unspecified, not intractable, without status epilepticus: Secondary | ICD-10-CM | POA: Diagnosis not present

## 2017-09-24 DIAGNOSIS — E1140 Type 2 diabetes mellitus with diabetic neuropathy, unspecified: Secondary | ICD-10-CM | POA: Diagnosis not present

## 2017-09-24 DIAGNOSIS — I480 Paroxysmal atrial fibrillation: Secondary | ICD-10-CM | POA: Diagnosis not present

## 2017-09-24 DIAGNOSIS — G459 Transient cerebral ischemic attack, unspecified: Secondary | ICD-10-CM | POA: Diagnosis not present

## 2017-09-24 LAB — GLUCOSE, CAPILLARY
GLUCOSE-CAPILLARY: 125 mg/dL — AB (ref 70–99)
GLUCOSE-CAPILLARY: 127 mg/dL — AB (ref 70–99)

## 2017-09-24 MED ORDER — LEVOTHYROXINE SODIUM 112 MCG PO TABS: 112.0000 ug | ORAL_TABLET | Freq: Every day | ORAL | Status: DC

## 2017-09-24 NOTE — Telephone Encounter (Signed)
I have attempted to leave several messages on pt VM but it can not accept any messages at this time.

## 2017-09-24 NOTE — Care Management Note (Signed)
Case Management Note  Patient Details  Name: Samantha Aguirre MRN: [id number] Date of Birth: May 10, 1945  Subjective/Objective:                    Action/Plan: Pt discharging to home with continuation of HH through [facility]. CM called Tammy with [facility] and informed her of the orders.  Pt states her family can not provide transport home. CM called [place] [facility] and they have set up for [facility] to transport her home. [facility] will arrive at 1 pm. Bedside RN and patient aware.   Expected Discharge Date:  09/24/17               Expected Discharge Plan:  [place]  In-House Referral:     Discharge planning Services  CM Consult  Post Acute Care Choice:  Home Health Choice offered to:  Patient  DME Arranged:    DME Agency:     HH Arranged:  RN, PT, OT, Nurse's Aide [place] Agency:  [facility]  Status of Service:  Completed, signed off  If discussed at [place] of Stay Meetings, dates discussed:    Additional Comments:  Pollie Friar, RN 09/24/2017, 12:34 PM

## 2017-09-24 NOTE — Progress Notes (Signed)
NURSING PROGRESS NOTE  Latania Bascomb [id number] Discharge Data: 09/24/2017 12:49 PM Attending Provider: Geradine Girt, DO IRS:WNIOE, Cherlyn Labella, MD     [name] to be D/C'd Home per MD order.  Discussed with the patient the After Visit Summary and all questions fully answered. All IV's discontinued with no bleeding noted. All belongings returned to patient for patient to take home.   Last Vital Signs:  Blood pressure (!) 136/55, pulse (!) 47, temperature 97.7 F (36.5 C), temperature source Oral, resp. rate 18, height '5\' 1"'  (1.549 m), weight 80.4 kg, SpO2 100 %.  Discharge Medication List Allergies as of 09/24/2017      Reactions   Methotrexate Rash   Mushroom Extract Complex Anaphylaxis   Made patient "deathly sick"   Atenolol    "MD took me off of it bc it was doing something wrong" Made patient HYPOTENSIVE   Ivp Dye [iodinated Diagnostic Agents] Itching   Pt injected with IV contrast.  5 min after injection pt complained of itching behind ear and on her abd.   Morphine And Related Other (See Comments)   "stops my breathing" NEAR-RESPIRATORY ARREST   Oxycodone Hcl    Percocet [oxycodone-acetaminophen] Nausea And Vomiting, Other (See Comments)   Stomach pains   Percodan [oxycodone-aspirin] Nausea And Vomiting, Other (See Comments)   Stomach pains   Tramadol Nausea Only   Verapamil Other (See Comments)   " MD told me this was screwing me up" MAKES PATIENT HYPOTENSIVE      Medication List    STOP taking these medications   furosemide 20 MG tablet Commonly known as:  LASIX   lisinopril 20 MG tablet Commonly known as:  PRINIVIL,ZESTRIL   potassium chloride 10 MEQ CR capsule Commonly known as:  MICRO-K   SUMAtriptan 25 MG tablet Commonly known as:  IMITREX     TAKE these medications   albuterol 108 (90 Base) MCG/ACT inhaler Commonly known as:  PROVENTIL HFA;VENTOLIN HFA Inhale 2 puffs into the lungs every 6 (six) hours as needed for wheezing or  shortness of breath.   aspirin 325 MG tablet Take 1 tablet (325 mg total) by mouth daily.   atorvastatin 80 MG tablet Commonly known as:  LIPITOR Take 1 tablet (80 mg total) by mouth daily at 6 PM.   blood glucose meter kit and supplies Dispense based on patient and insurance preference. Use up to four times daily as directed. (FOR ICD-10 E10.9, E11.9).   diclofenac sodium 1 % Gel Commonly known as:  VOLTAREN Apply 2 g topically 4 (four) times daily. What changed:    when to take this  reasons to take this   DULoxetine 60 MG capsule Commonly known as:  CYMBALTA Take 1 capsule (60 mg total) by mouth at bedtime.   feeding supplement (GLUCERNA SHAKE) Liqd Take 237 mLs by mouth 2 (two) times daily between meals.   ferrous sulfate 325 (65 FE) MG EC tablet Take 1 tablet (325 mg total) by mouth 2 (two) times daily with a meal.   fluticasone 50 MCG/ACT nasal spray Commonly known as:  FLONASE Place 1 spray into both nostrils daily.   gabapentin 800 MG tablet Commonly known as:  NEURONTIN Take 1 tablet (800 mg total) by mouth 2 (two) times daily.   glipiZIDE-metformin 5-500 MG tablet Commonly known as:  METAGLIP Take 1 tablet by mouth 2 (two) times daily before a meal.   levETIRAcetam 500 MG tablet Commonly known as:  KEPPRA Take 1 tablet (500 mg total)  by mouth 2 (two) times daily.   levothyroxine 112 MCG tablet Commonly known as:  SYNTHROID, LEVOTHROID Take 1 tablet (112 mcg total) by mouth daily before breakfast. What changed:    medication strength  how much to take   nitroGLYCERIN 0.4 MG SL tablet Commonly known as:  NITROSTAT Place 1 tablet (0.4 mg total) under the tongue every 5 (five) minutes as needed for chest pain.   omeprazole 20 MG capsule Commonly known as:  PRILOSEC Take 20 mg by mouth 2 (two) times daily.   pantoprazole 40 MG tablet Commonly known as:  PROTONIX Take 1 tablet (40 mg total) by mouth 2 (two) times daily.   sucralfate 1 g  tablet Commonly known as:  CARAFATE TAKE (1) TABLET BY MOUTH FOUR TIMES A DAY What changed:  See the new instructions.   umeclidinium bromide 62.5 MCG/INH Aepb Commonly known as:  INCRUSE ELLIPTA Inhale 1 puff into the lungs daily.   vitamin B-12 1000 MCG tablet Commonly known as:  CYANOCOBALAMIN Take 1 tablet by mouth daily.

## 2017-09-24 NOTE — Discharge Summary (Signed)
    Physician Discharge Summary  Samantha Aguirre MRN:[id number] DOB: 08/06/1945 DOA: 09/22/2017  PCP: Hande, Vishwanath, MD  Admit date: 09/22/2017 Discharge date: 09/24/2017  Admitted From: home Discharge disposition: home   Recommendations for Outpatient Follow-Up:   Needs urgent referral for Watchman's evaluation maxed out home health- refused placement Holding BP Meds until seen by PCP--BP lower end of normal off meds  Discharge Diagnosis:   Principal Problem:   TIA (transient ischemic attack) Active Problems:   Controlled type 2 diabetes mellitus with diabetic neuropathy (HCC)   COPD (chronic obstructive pulmonary disease) (HCC)   Seizure disorder (HCC)   Left-sided weakness   Stroke (cerebrum) (HCC)   GI bleed   PAF (paroxysmal atrial fibrillation) (HCC)    Discharge Condition: Improved.  Diet recommendation: Low sodium, heart healthy  Wound care: None.  Code status: Full.   History of Present Illness:   Samantha Aguirre is a 72 y.o. female with history of stroke who has had recent TPA, history of recent GI bleed, A. fib not on anticoagulation secondary recent GI bleeding, diabetes mellitus type 2, seizures hyperlipidemia was on the way to [facility] due to left ankle pain was noticed to have left facial droop and increased left-sided weakness than her baseline by EMS and was brought into the ER at [place] as code stroke.  Patient's symptoms started from around 1 PM.   Hospital Course by Problem:   Possible TIA Patient presented on 07/31/2017 for left facial droop and left-sided weakness and numbness.  However MRI only showed left internal capsule small infarct.  Carotid Doppler, MRA, 2D echo all unremarkable.  LDL 44 and A1c 5.6 UDS negative.  Discussed with patient GI doctor over the phone, apparently high risk for antiplatelet or anticoagulation.  Patient was put on aspirin 325 and Lipitor 80 on discharge with plan to have watchman  device referral to [facility] cardiology.  However, for the last 2 months, patient has not been set up for watchman device yet.  This time patient had again acute onset left facial droop and left-sided weakness.  Symptoms gradually improved, and currently left facial droop resolved.  Patient denies any left arm weakness but complains of left leg weakness continued.  On exam, it appears that left leg weakness 3/5 largely because of left knee pain.  No hyperreflexia or Babinski sign or Hoffmann sign to indicate upper neuro disease or spinal cord compression.  MRI no acute abnormality, but old left parietal occipital infarct.  CTA head and neck unremarkable cerebral vasculature, except left cavernous sinus meningioma. EF 65 to 70%.  A1c 6.4 and LDL 73.  Patient episode of left facial droop concerning for TIA, however left leg weakness largely related to left knee pain, recommend to continue follow-up with orthopedic surgeon in [place] for left knee pain.  Given her history of A. fib not on anticoagulation due to GI bleeding, recommend to continue aspirin 325 at this time but need urgent referral to [facility] cardiology for watchman device consideration.  Continue Lipitor on discharge  GIB   07/11/17 admisstion to [facility] "GIB/chronic blood loss anemia:History of recent anemia requiring transfusions during 2 different hospitalizations. EGD/colonoscopy initially notable earlier in 2019 for colonic polyps which were removed. EGD this week notable for AVMs which were treated with local therapy. Follow-up capsule endoscopy notable for active small bowel bleeding and nonbleeding AVMs. Patient was transferred to [facility] for evaluation for possible balloon enteroscopy. GI did review disc with capsule enteroscopy images. Noted   proximal small bowel lesions that appeared to be ulcers with oozing per GI. They feel these lesions could be amenable to treatment with push enteroscopy. The polyps noted on capsule appeared to be  submucosal lesions possibly lipomas, that are likely not a bleeding source. Noted more distal AVMs that are nonbleeding. Hgb stable ~ 8.5. GI was consulted; s/p enteroscopy today; noted one non-bleeding jejunal ulcer with no stigmata of bleeding.A single non-bleeding angiodysplastic lesion in the jejunum. Treated with argon plasma coagulation (APC).An area in the proximal jejunum was tattooed. She will continue PPI and sucralfate per outside GI recommendations. Ok to resume ASA in 3 days (continue to hold apixaban) if no further bleeding. We were able to advance her diet and she tolerated a regular diet prior to DC."  Discussion with her GI physician last admission,apparently very high risk for GI bleeding on any antiplatelet or anticoagulation  As long discussion with patient last admission, given her several choices for antiplatelet or anticoagulation, she would like to be referred to [facility] or [place] cardiology for watchman device  History of stroke  With residual right-sided weakness  08/2015,worsening right-sided weakness, MRI negative, consider recrudescence  02/2016, left facial droop and left-sided weakness, MRI/MRA negative, found to have A. fib, put on Eliquis  04/2016, headache, imbalance, right eye pain, facial droop, MRI negative  03/2017, left facial droop and left-sided weakness, MRI showed right thalamic and left temporal infarcts. MRI showed bilateral M2/M3, right PCA atherosclerosis.  06/2017, left-sided weakness, MRI negative for acute stroke  History of seizure  On home Keppra  PAF off Eliquis  On aspirin PTA due to GI bleeding  Now on aspirin 325  Not an AC candidate d/t high risk GI bleeding  Refer to [facility] or [facility] cardiology for watchman device  Hypertension  Stable  BP goal normotensive  Hyperlipidemia  Home meds:  lipitor 80, resumed in hospital  LDL 73, goal < 70  Continue statin at discharge  Diabetes type II with neuropathy  HgbA1c 6.4, at  goal < 7.0  SSI  neurontin  Tobacco abuse  Current smoker  Smoking cessation counseling provided  Pt is willing to quit  Obesity  Body mass index is 33.49 kg/m.  Hypokalemia -replete  Medical Consultants:    neuro  Discharge Exam:   Vitals:   09/24/17 0837 09/24/17 0857  BP: (!) 118/91   Pulse: (!) 49   Resp:    Temp: (!) 97.5 F (36.4 C)   SpO2: 100% 97%   Vitals:   09/23/17 2347 09/24/17 0437 09/24/17 0837 09/24/17 0857  BP: 99/60 125/65 (!) 118/91   Pulse: (!) 51 (!) 46 (!) 49   Resp: 18 18    Temp: 97.6 F (36.4 C) 97.7 F (36.5 C) (!) 97.5 F (36.4 C)   TempSrc: Oral Oral Oral   SpO2: 96% 96% 100% 97%  Weight:      Height:        General exam: Appears calm and comfortable.   The results of significant diagnostics from this hospitalization (including imaging, microbiology, ancillary and laboratory) are listed below for reference.     Procedures and Diagnostic Studies:   Ct Angio Head W Or Wo Contrast  Addendum Date: 09/22/2017   ADDENDUM REPORT: 09/22/2017 15:14 ADDENDUM: 7 mm enhancing right parasagittal mass unchanged from prior studies consistent with meningioma. Electronically Signed   By: Franchot Gallo M.D.   On: 09/22/2017 15:14   Result Date: 09/22/2017 CLINICAL DATA:  Left-sided weakness.  Stroke. EXAM:  CT ANGIOGRAPHY HEAD AND NECK TECHNIQUE: Multidetector CT imaging of the head and neck was performed using the standard protocol during bolus administration of intravenous contrast. Multiplanar CT image reconstructions and MIPs were obtained to evaluate the vascular anatomy. Carotid stenosis measurements (when applicable) are obtained utilizing NASCET criteria, using the distal internal carotid diameter as the denominator. CONTRAST:  50mL ISOVUE-370 IOPAMIDOL (ISOVUE-370) INJECTION 76% COMPARISON:  Numerous prior imaging studies. CT head today. MRA head 07/30/2017. CTA 09/16/2015, MRI head with contrast 04/25/2014 FINDINGS: CTA NECK  FINDINGS Aortic arch: Atherosclerotic aortic arch without aneurysm. Atherosclerotic disease proximal great vessels without significant stenosis. Right carotid system: Atherosclerotic calcification in the right common carotid artery. Atherosclerotic plaque at the right carotid bifurcation and proximal right internal carotid artery. Less than 25% diameter stenosis right internal carotid artery Left carotid system: Mild atherosclerotic calcification in the left common carotid artery. Mild atherosclerotic calcification left carotid bifurcation without significant stenosis. Vertebral arteries: Moderate stenosis origin of the vertebral artery bilaterally due to calcific stenosis. Both vertebral arteries are patent to the basilar without additional stenosis. Skeleton: Negative Other neck: Negative for mass or adenopathy. Upper chest: Lung apices are clear aside from a calcified granuloma left upper lobe. Review of the MIP images confirms the above findings CTA HEAD FINDINGS Anterior circulation: Mild atherosclerotic calcification in the cavernous carotid bilaterally without significant stenosis. Anterior and middle cerebral arteries patent bilaterally without stenosis. Enhancing lesion in the left cavernous sinus medial to the cavernous carotid. This shows homogeneous enhancement and has rounded contours measuring approximately 8 x 14 mm. The enhancement is significantly less than arterial enhancement and is slightly greater than venous enhancement. Therefore, this is not felt to be an aneurysm. This has been present on prior studies and showed enhancement on the MRI 2016. This has been followed as a probable meningioma. No change since the prior MRA of 09/16/2015 Posterior circulation: Both vertebral arteries are widely patent to the basilar. PICA AICA superior cerebellar and posterior cerebral arteries patent bilaterally. Fetal origin of the posterior cerebral artery bilaterally with relatively small basilar, normal  variation. Venous sinuses: Patent Anatomic variants: None Delayed phase: Not performed Review of the MIP images confirms the above findings IMPRESSION: 1. Negative for emergent large vessel occlusion 2. Atherosclerotic disease aortic arch and carotid bifurcation bilaterally without significant stenosis. Moderate stenosis origin of both vertebral artery due to calcific disease. 3. Enhancing mass in the left cavernous sinus is stable from prior studies and may represent a meningioma measuring 8 x 14 mm. Electronically Signed: By: Charles  Clark M.D. On: 09/22/2017 15:05   Ct Angio Neck W Or Wo Contrast  Addendum Date: 09/22/2017   ADDENDUM REPORT: 09/22/2017 15:14 ADDENDUM: 7 mm enhancing right parasagittal mass unchanged from prior studies consistent with meningioma. Electronically Signed   By: Charles  Clark M.D.   On: 09/22/2017 15:14   Result Date: 09/22/2017 CLINICAL DATA:  Left-sided weakness.  Stroke. EXAM: CT ANGIOGRAPHY HEAD AND NECK TECHNIQUE: Multidetector CT imaging of the head and neck was performed using the standard protocol during bolus administration of intravenous contrast. Multiplanar CT image reconstructions and MIPs were obtained to evaluate the vascular anatomy. Carotid stenosis measurements (when applicable) are obtained utilizing NASCET criteria, using the distal internal carotid diameter as the denominator. CONTRAST:  50mL ISOVUE-370 IOPAMIDOL (ISOVUE-370) INJECTION 76% COMPARISON:  Numerous prior imaging studies. CT head today. MRA head 07/30/2017. CTA 09/16/2015, MRI head with contrast 04/25/2014 FINDINGS: CTA NECK FINDINGS Aortic arch: Atherosclerotic aortic arch without aneurysm. Atherosclerotic disease proximal great   vessels without significant stenosis. Right carotid system: Atherosclerotic calcification in the right common carotid artery. Atherosclerotic plaque at the right carotid bifurcation and proximal right internal carotid artery. Less than 25% diameter stenosis right  internal carotid artery Left carotid system: Mild atherosclerotic calcification in the left common carotid artery. Mild atherosclerotic calcification left carotid bifurcation without significant stenosis. Vertebral arteries: Moderate stenosis origin of the vertebral artery bilaterally due to calcific stenosis. Both vertebral arteries are patent to the basilar without additional stenosis. Skeleton: Negative Other neck: Negative for mass or adenopathy. Upper chest: Lung apices are clear aside from a calcified granuloma left upper lobe. Review of the MIP images confirms the above findings CTA HEAD FINDINGS Anterior circulation: Mild atherosclerotic calcification in the cavernous carotid bilaterally without significant stenosis. Anterior and middle cerebral arteries patent bilaterally without stenosis. Enhancing lesion in the left cavernous sinus medial to the cavernous carotid. This shows homogeneous enhancement and has rounded contours measuring approximately 8 x 14 mm. The enhancement is significantly less than arterial enhancement and is slightly greater than venous enhancement. Therefore, this is not felt to be an aneurysm. This has been present on prior studies and showed enhancement on the MRI 2016. This has been followed as a probable meningioma. No change since the prior MRA of 09/16/2015 Posterior circulation: Both vertebral arteries are widely patent to the basilar. PICA AICA superior cerebellar and posterior cerebral arteries patent bilaterally. Fetal origin of the posterior cerebral artery bilaterally with relatively small basilar, normal variation. Venous sinuses: Patent Anatomic variants: None Delayed phase: Not performed Review of the MIP images confirms the above findings IMPRESSION: 1. Negative for emergent large vessel occlusion 2. Atherosclerotic disease aortic arch and carotid bifurcation bilaterally without significant stenosis. Moderate stenosis origin of both vertebral artery due to calcific  disease. 3. Enhancing mass in the left cavernous sinus is stable from prior studies and may represent a meningioma measuring 8 x 14 mm. Electronically Signed: By: Franchot Gallo M.D. On: 09/22/2017 15:05   [name]  Result Date: 09/22/2017 CLINICAL DATA:  Initial evaluation for acute left-sided facial droop with left facial weakness. EXAM: MRI HEAD WITHOUT CONTRAST TECHNIQUE: Multiplanar, multiecho pulse sequences of the brain and surrounding structures were obtained without intravenous contrast. COMPARISON:  Prior CTA from earlier the same day as well as previous MRI from 07/30/2017 FINDINGS: Brain: Examination degraded by motion artifact. Generalized age-related cerebral atrophy, stable. Extensive patchy and confluent T2/FLAIR hyperintensity within the periventricular and deep white matter both cerebral hemispheres, most consistent with chronic small vessel ischemic disease, also similar to previous. Encephalomalacia with gliosis involving the left temporal occipital region with associated chronic susceptibility artifact, stable from previous. Additional small remote bilateral cerebellar infarcts noted. Chronic right thalamic lacunar infarct No abnormal foci of restricted diffusion to suggest acute or subacute ischemia. Gray-white matter differentiation otherwise maintained. No acute intracranial hemorrhage. 1 cm meningioma at the posterior right cerebral convexity without significant mass effect, stable. No other mass lesion. No mass effect or midline shift. No hydrocephalus. No extra-axial fluid collection. Pituitary gland within normal limits. Vascular: Major intracranial vascular flow voids maintained. Skull and upper cervical spine: Craniocervical junction normal. Upper cervical spine within normal limits. Bone marrow signal intensity normal. No scalp soft tissue abnormality. Sinuses/Orbits: Globes and orbital soft tissues within normal limits. Paranasal sinuses are clear. No mastoid effusion.  Inner ear structures normal. Other: None. IMPRESSION: 1. No acute intracranial abnormality. 2. Chronic posterior left MCA territory infarct, with remote right thalamic lacunar infarct and  old bilateral cerebellar infarcts. 3. Atrophy with moderate chronic small vessel ischemic disease, stable. Electronically Signed   By: Benjamin  McClintock M.D.   On: 09/22/2017 19:58   Ct Head Code Stroke Wo Contrast  Result Date: 09/22/2017 CLINICAL DATA:  Code stroke.  Left leg weakness EXAM: CT HEAD WITHOUT CONTRAST TECHNIQUE: Contiguous axial images were obtained from the base of the skull through the vertex without intravenous contrast. COMPARISON:  08/06/2017 FINDINGS: Brain: Moderate remote infarct in the left cerebrum along the parietal lobe reaching into the occipital cortex. Small remote bilateral cerebellar infarcts. Remote lacunar infarct in the right caudate head. Ischemic gliosis in the cerebral white matter. No hemorrhage, hydrocephalus, or intra-axial masslike finding. There is a partially calcified extra-axial mass the high right frontal convexity measuring 1 cm. Vascular: No hyperdense vessel. Skull: No acute finding Sinuses/Orbits: Negative Other: These results were communicated to Dr. Arora at 2:30 pmon 9/10/2019by text page via the [employer] messaging system. ASPECTS (Alberta Stroke Program Early CT Score) -right hemisphere - Ganglionic level infarction (caudate, lentiform nuclei, internal capsule, insula, M1-M3 cortex): 7 - Supraganglionic infarction (M4-M6 cortex): 3 Total score (0-10 with 10 being normal): 10 IMPRESSION: 1. No acute finding.  ASPECTS is 10 in the right hemisphere. 2. Moderate remote left parietooccipital infarct. Remote small vessel infarcts in the infra and supratentorial brain. 3. 1 cm calcified meningioma along the right frontal convexity. Electronically Signed   By: Jonathon  Watts M.D.   On: 09/22/2017 14:32     Labs:   Basic Metabolic Panel: Recent Labs  Lab 09/22/17 1430  09/22/17 1431 09/23/17 0415  NA 140 139 140  K 3.3* 3.3* 3.4*  CL 109 107 106  CO2 22  --  24  GLUCOSE 167* 158* 117*  BUN 10 8 6*  CREATININE 0.65 0.60 0.63  CALCIUM 9.0  --  8.9   GFR Estimated Creatinine Clearance: 60.1 mL/min (by C-G formula based on SCr of 0.63 mg/dL). Liver Function Tests: Recent Labs  Lab 09/22/17 1430 09/23/17 0415  AST 18 19  ALT 15 16  ALKPHOS 106 92  BILITOT 0.2* 0.4  PROT 6.0* 5.7*  ALBUMIN 3.4* 3.2*   No results for input(s): LIPASE, AMYLASE in the last 168 hours. No results for input(s): AMMONIA in the last 168 hours. Coagulation profile Recent Labs  Lab 09/22/17 1430  INR 1.00    CBC: Recent Labs  Lab 09/22/17 1430 09/22/17 1431 09/23/17 0415  WBC 9.6  --  7.5  NEUTROABS 5.9  --   --   HGB 11.3* 11.9* 11.3*  HCT 37.3 35.0* 36.7  MCV 88.4  --  87.4  PLT 315  --  234   Cardiac Enzymes: No results for input(s): CKTOTAL, CKMB, CKMBINDEX, TROPONINI in the last 168 hours. BNP: Invalid input(s): POCBNP CBG: Recent Labs  Lab 09/23/17 0744 09/23/17 1211 09/23/17 1628 09/23/17 2011 09/24/17 0611  GLUCAP 123* 145* 129* 153* 125*   D-Dimer No results for input(s): DDIMER in the last 72 hours. Hgb A1c Recent Labs    09/23/17 0415  HGBA1C 6.4*   Lipid Profile Recent Labs    09/23/17 0415  CHOL 149  HDL 41  LDLCALC 73  TRIG 177*  CHOLHDL 3.6   Thyroid function studies No results for input(s): TSH, T4TOTAL, T3FREE, THYROIDAB in the last 72 hours.  Invalid input(s): FREET3 Anemia work up No results for input(s): VITAMINB12, FOLATE, FERRITIN, TIBC, IRON, RETICCTPCT in the last 72 hours. Microbiology No results found for this or any   previous visit (from the past 240 hour(s)).   Discharge Instructions:   Discharge Instructions    Diet - low sodium heart healthy   Complete by:  As directed    Diet Carb Modified   Complete by:  As directed    Discharge instructions   Complete by:  As directed    Home  health I have notified your PCP of need for urgent referral to [facility] for watchman evaluation Holding BP meds until seen by PCP as BP lower end of normal   Increase activity slowly   Complete by:  As directed      Allergies as of 09/24/2017      Reactions   Methotrexate Rash   Mushroom Extract Complex Anaphylaxis   Made patient "deathly sick"   Atenolol    "MD took me off of it bc it was doing something wrong" Made patient HYPOTENSIVE   Ivp Dye [iodinated Diagnostic Agents] Itching   Pt injected with IV contrast.  5 min after injection pt complained of itching behind ear and on her abd.   Morphine And Related Other (See Comments)   "stops my breathing" NEAR-RESPIRATORY ARREST   Oxycodone Hcl    Percocet [oxycodone-acetaminophen] Nausea And Vomiting, Other (See Comments)   Stomach pains   Percodan [oxycodone-aspirin] Nausea And Vomiting, Other (See Comments)   Stomach pains   Tramadol Nausea Only   Verapamil Other (See Comments)   " MD told me this was screwing me up" MAKES PATIENT HYPOTENSIVE      Medication List    STOP taking these medications   furosemide 20 MG tablet Commonly known as:  LASIX   lisinopril 20 MG tablet Commonly known as:  PRINIVIL,ZESTRIL   potassium chloride 10 MEQ CR capsule Commonly known as:  MICRO-K   SUMAtriptan 25 MG tablet Commonly known as:  IMITREX     TAKE these medications   albuterol 108 (90 Base) MCG/ACT inhaler Commonly known as:  PROVENTIL HFA;VENTOLIN HFA Inhale 2 puffs into the lungs every 6 (six) hours as needed for wheezing or shortness of breath.   aspirin 325 MG tablet Take 1 tablet (325 mg total) by mouth daily.   atorvastatin 80 MG tablet Commonly known as:  LIPITOR Take 1 tablet (80 mg total) by mouth daily at 6 PM.   blood glucose meter kit and supplies Dispense based on patient and insurance preference. Use up to four times daily as directed. (FOR ICD-10 E10.9, E11.9).   diclofenac sodium 1 % Gel Commonly  known as:  VOLTAREN Apply 2 g topically 4 (four) times daily. What changed:    when to take this  reasons to take this   DULoxetine 60 MG capsule Commonly known as:  CYMBALTA Take 1 capsule (60 mg total) by mouth at bedtime.   feeding supplement (GLUCERNA SHAKE) Liqd Take 237 mLs by mouth 2 (two) times daily between meals.   ferrous sulfate 325 (65 FE) MG EC tablet Take 1 tablet (325 mg total) by mouth 2 (two) times daily with a meal.   fluticasone 50 MCG/ACT nasal spray Commonly known as:  FLONASE Place 1 spray into both nostrils daily.   gabapentin 800 MG tablet Commonly known as:  NEURONTIN Take 1 tablet (800 mg total) by mouth 2 (two) times daily.   glipiZIDE-metformin 5-500 MG tablet Commonly known as:  METAGLIP Take 1 tablet by mouth 2 (two) times daily before a meal.   levETIRAcetam 500 MG tablet Commonly known as:  KEPPRA Take 1 tablet (  500 mg total) by mouth 2 (two) times daily.   levothyroxine 112 MCG tablet Commonly known as:  SYNTHROID, LEVOTHROID Take 1 tablet (112 mcg total) by mouth daily before breakfast. What changed:    medication strength  how much to take   nitroGLYCERIN 0.4 MG SL tablet Commonly known as:  NITROSTAT Place 1 tablet (0.4 mg total) under the tongue every 5 (five) minutes as needed for chest pain.   omeprazole 20 MG capsule Commonly known as:  PRILOSEC Take 20 mg by mouth 2 (two) times daily.   pantoprazole 40 MG tablet Commonly known as:  PROTONIX Take 1 tablet (40 mg total) by mouth 2 (two) times daily.   sucralfate 1 g tablet Commonly known as:  CARAFATE TAKE (1) TABLET BY MOUTH FOUR TIMES A DAY What changed:  See the new instructions.   umeclidinium bromide 62.5 MCG/INH Aepb Commonly known as:  INCRUSE ELLIPTA Inhale 1 puff into the lungs daily.   vitamin B-12 1000 MCG tablet Commonly known as:  CYANOCOBALAMIN Take 1 tablet by mouth daily.      Follow-up Information    Potter, Zachary E, MD Follow up on  10/20/2017.   Specialty:  Neurology Contact information: [address] [facility] [place] [address] [phone]        Hande, Vishwanath, MD Follow up.   Specialty:  Internal Medicine Why:  keep appointment for tomm 9/13--- needs referral to EP for evaluation of Watchman device Contact information: [address] [facility] [place]  [address] [phone]            Time coordinating discharge: 35 min  Signed:   U   [facility] 09/24/2017, 9:26 AM     

## 2017-09-25 ENCOUNTER — Other Ambulatory Visit: Payer: Self-pay | Admitting: Family Medicine

## 2017-09-25 ENCOUNTER — Other Ambulatory Visit: Payer: Self-pay | Admitting: *Deleted

## 2017-09-25 DIAGNOSIS — R2689 Other abnormalities of gait and mobility: Secondary | ICD-10-CM | POA: Diagnosis not present

## 2017-09-25 DIAGNOSIS — E1140 Type 2 diabetes mellitus with diabetic neuropathy, unspecified: Secondary | ICD-10-CM | POA: Diagnosis not present

## 2017-09-25 DIAGNOSIS — D500 Iron deficiency anemia secondary to blood loss (chronic): Secondary | ICD-10-CM | POA: Diagnosis not present

## 2017-09-25 DIAGNOSIS — Z7984 Long term (current) use of oral hypoglycemic drugs: Secondary | ICD-10-CM | POA: Diagnosis not present

## 2017-09-25 DIAGNOSIS — I110 Hypertensive heart disease with heart failure: Secondary | ICD-10-CM | POA: Diagnosis not present

## 2017-09-25 DIAGNOSIS — I69354 Hemiplegia and hemiparesis following cerebral infarction affecting left non-dominant side: Secondary | ICD-10-CM | POA: Diagnosis not present

## 2017-09-25 DIAGNOSIS — I480 Paroxysmal atrial fibrillation: Secondary | ICD-10-CM | POA: Diagnosis not present

## 2017-09-25 DIAGNOSIS — G43919 Migraine, unspecified, intractable, without status migrainosus: Secondary | ICD-10-CM

## 2017-09-25 DIAGNOSIS — I2510 Atherosclerotic heart disease of native coronary artery without angina pectoris: Secondary | ICD-10-CM | POA: Diagnosis not present

## 2017-09-25 DIAGNOSIS — I5032 Chronic diastolic (congestive) heart failure: Secondary | ICD-10-CM | POA: Diagnosis not present

## 2017-09-25 NOTE — Patient Outreach (Signed)
Samantha Aguirre [facility]) Care Management  09/25/2017  Samantha Aguirre 05/05/1945 [id number]   Phone call to [facility] to follow up on the request for an assessment for personal care services. Per [employer], the request form was received on 09/11/17, however form ws submitted using an outdated form that they no longer use. An updated form was sent back to the provider's office to re-submit which has not been received.    Phone call to [place] at [facility]. message left for a return call regarding the re-submission of the request form for personal care services.   [name] [facility] Care Management [phone]

## 2017-09-28 ENCOUNTER — Other Ambulatory Visit: Payer: Self-pay | Admitting: *Deleted

## 2017-09-28 ENCOUNTER — Encounter: Payer: Self-pay | Admitting: *Deleted

## 2017-09-28 DIAGNOSIS — I5032 Chronic diastolic (congestive) heart failure: Secondary | ICD-10-CM | POA: Diagnosis not present

## 2017-09-28 DIAGNOSIS — I2510 Atherosclerotic heart disease of native coronary artery without angina pectoris: Secondary | ICD-10-CM | POA: Diagnosis not present

## 2017-09-28 DIAGNOSIS — E1140 Type 2 diabetes mellitus with diabetic neuropathy, unspecified: Secondary | ICD-10-CM | POA: Diagnosis not present

## 2017-09-28 DIAGNOSIS — I110 Hypertensive heart disease with heart failure: Secondary | ICD-10-CM | POA: Diagnosis not present

## 2017-09-28 DIAGNOSIS — I480 Paroxysmal atrial fibrillation: Secondary | ICD-10-CM | POA: Diagnosis not present

## 2017-09-28 DIAGNOSIS — I69354 Hemiplegia and hemiparesis following cerebral infarction affecting left non-dominant side: Secondary | ICD-10-CM | POA: Diagnosis not present

## 2017-09-28 NOTE — Patient Outreach (Addendum)
[facility]) Care Management   09/28/2017  Toby Ayad 11/01/1945 [id number]  Samantha Aguirre is an 72 y.o. female  Recent admission to [facility], Dx: TIA.  PMH includes but not limited to : Diabetes , COPD, Left side weakness, stroke, GI bleed, PAF,  Transition of care by PCP office   Subjective:  Patient discussed not feeling so good this morning , feels a little a nauseated after eating. Patient discussed problem with loose stools over the last few weeks, 1 loose a day and on occasion 3 a day, she discussed having a upcoming appointment with GI on 9/23.  Complains ongoing headache. Patient discussed walking up sidewalk a few blocks at times to the store, she discussed working on getting a scooter. Patient discussed that she does not need to be in a nursing home, once she gets thing in place at home, speaking of scooter and personal care services.  Patient states she is well aware how to make contact for medicaid transportation for her medical appointments but she is can't afford to pay for other transportation      Objective:  BP 118/60 (BP Location: Left Arm, Patient Position: Sitting, Cuff Size: Normal)   Pulse 67   Resp 18   Ht 1.549 m ('5\' 1"' )   Wt 175 lb (79.4 kg)   SpO2 97%   BMI 33.07 kg/m   Using rolling walker  Review of Systems  Constitutional: Negative.   HENT: Negative.   Eyes: Negative.   Respiratory: Negative.   Cardiovascular: Negative.   Gastrointestinal: Negative.   Genitourinary: Negative.   Musculoskeletal: Positive for joint pain.       Left knee   Skin: Negative.   Neurological: Positive for dizziness and weakness.  Endo/Heme/Allergies: Negative.   Psychiatric/Behavioral: Negative.     Physical Exam  Constitutional: She is oriented to person, place, and time. She appears well-developed and well-nourished.  Cardiovascular: Normal rate and normal heart sounds.  Respiratory: Effort normal and breath sounds normal.   GI: Soft.  Neurological: She is alert and oriented to person, place, and time.  Left side weakness   Skin: Skin is warm and dry.  Psychiatric: She has a normal mood and affect. Her behavior is normal. Judgment and thought content normal.    Encounter Medications:   Outpatient Encounter Medications as of 09/28/2017  Medication Sig  . albuterol (PROVENTIL HFA;VENTOLIN HFA) 108 (90 Base) MCG/ACT inhaler Inhale 2 puffs into the lungs every 6 (six) hours as needed for wheezing or shortness of breath.  Marland Kitchen aspirin 325 MG tablet Take 1 tablet (325 mg total) by mouth daily.  Marland Kitchen atorvastatin (LIPITOR) 80 MG tablet Take 1 tablet (80 mg total) by mouth daily at 6 PM.  . diclofenac sodium (VOLTAREN) 1 % GEL Apply 2 g topically 4 (four) times daily. (Patient taking differently: Apply 2 g topically daily as needed. )  . DULoxetine (CYMBALTA) 60 MG capsule Take 1 capsule (60 mg total) by mouth at bedtime.  . ferrous sulfate 325 (65 FE) MG EC tablet Take 1 tablet (325 mg total) by mouth 2 (two) times daily with a meal.  . fluticasone (FLONASE) 50 MCG/ACT nasal spray Place 1 spray into both nostrils daily.  Marland Kitchen gabapentin (NEURONTIN) 800 MG tablet Take 1 tablet (800 mg total) by mouth 2 (two) times daily.  Marland Kitchen glipiZIDE-metformin (METAGLIP) 5-500 MG tablet Take 1 tablet by mouth 2 (two) times daily before a meal.  . levETIRAcetam (KEPPRA) 500 MG tablet Take 1  tablet (500 mg total) by mouth 2 (two) times daily.  Marland Kitchen levothyroxine (SYNTHROID, LEVOTHROID) 112 MCG tablet Take 1 tablet (112 mcg total) by mouth daily before breakfast.  . nitroGLYCERIN (NITROSTAT) 0.4 MG SL tablet Place 1 tablet (0.4 mg total) under the tongue every 5 (five) minutes as needed for chest pain.  Marland Kitchen omeprazole (PRILOSEC) 20 MG capsule Take 20 mg by mouth 2 (two) times daily.  . sucralfate (CARAFATE) 1 g tablet TAKE (1) TABLET BY MOUTH FOUR TIMES A DAY (Patient taking differently: Take 1 g by mouth 4 (four) times daily. )  . umeclidinium bromide  (INCRUSE ELLIPTA) 62.5 MCG/INH AEPB Inhale 1 puff into the lungs daily.  . vitamin B-12 (CYANOCOBALAMIN) 1000 MCG tablet Take 1 tablet by mouth daily.  . blood glucose meter kit and supplies Dispense based on patient and insurance preference. Use up to four times daily as directed. (FOR ICD-10 E10.9, E11.9).  . feeding supplement, GLUCERNA SHAKE, (GLUCERNA SHAKE) LIQD Take 237 mLs by mouth 2 (two) times daily between meals. (Patient not taking: Reported on 09/28/2017)  . pantoprazole (PROTONIX) 40 MG tablet Take 1 tablet (40 mg total) by mouth 2 (two) times daily. (Patient not taking: Reported on 09/28/2017)   No facility-administered encounter medications on file as of 09/28/2017.     Functional Status:   In your present state of health, do you have any difficulty performing the following activities: 09/28/2017 09/22/2017  Hearing? N N  Vision? N N  Difficulty concentrating or making decisions? N N  Walking or climbing stairs? Y Y  Dressing or bathing? N N  Doing errands, shopping? Tempie Donning  Comment uses medicaid transportation , walker, Facilities manager and eating ? N -  Comment - -  Using the Toilet? N -  In the past six months, have you accidently leaked urine? Y -  Do you have problems with loss of bowel control? N -  Managing your Medications? N -  Managing your Finances? N -  Housekeeping or managing your Housekeeping? Y -  Comment family assist at times -  Some recent data might be hidden    Fall/Depression Screening:    Fall Risk  07/21/2017 05/19/2017 03/10/2017  Falls in the past year? Yes Yes Yes  Number falls in past yr: 2 or more 2 or more 2 or more  Injury with Fall? Yes Yes Yes  Comment - - -  Risk Factor Category  High Fall Risk High Fall Risk High Fall Risk  Risk for fall due to : Impaired balance/gait Impaired balance/gait Impaired balance/gait  Risk for fall due to: Comment - - recently got a cane   Follow up Falls prevention discussed - Falls prevention discussed   PHQ  2/9 Scores 07/21/2017 05/19/2017 03/10/2017 11/24/2016 10/06/2016 11/13/2015  PHQ - 2 Score 0 0 2 0 2 4  PHQ- 9 Score - - 12 - 14 15    Assessment:  Routine home visit. Patient active with [facility] home [facility]. Discussed with patient [facility] LCSW Chrystal land is working on getting paperwork for Personal care services completed.   Recent TIA- Verbalizes symptoms of stroke and TIA and action plan . Has neurology visit on 10/8, PCP has  Been notified at discharge of referral regarding watchman device.  COPD- continues to smoke, declined desire to quit or receiving education .  Denies worsening of breathing . Diabetes - Needs CBG meter, unsure if her pharmacy covers meters.  Fall Risk- High fall risk, needs reinforcement on  fall precautions, use of walker. Active with home health PT. Patient working with PCP office on getting paperwork completed for getting a scooter. Discussed limiting walking outside during the hottest part of the day.  Medications - Patient continues to take lisinopril, lasix and potassium that where discontinued at hospital discharge, states will discuss with DR.Hande at visit on this week, 9/19, discussed reason for medication change due to concern regarding blood pressure while in hospital. .Patient reports not drinking Glucerna due to cost , offered discount coupon she declined stating she could not afford  Patient was recently discharged from hospital and all medications have been reviewed.    Plan:  Will follow up call in the next month. Placed call to PCP office to discuss concern regarding patient continuing to take medications discontinued at discharge, also to request blood sugar meter, strips and lancets, that patient states she has never received. Will send PCP home visit note and quarterly involvement letter.   [facility] CM Care Plan Problem One     Most Recent Value  Care Plan Problem One  Recent hospital admission for TIA, history of CVA   Role Documenting the Problem One   Care Management [place] for Problem One  Active  [facility] Long Term Goal   Patient will not experience a hospital admission in the next 60  days   [facility] Long Term Goal Start Date  09/28/17  Interventions for Problem One Long Term Goal  Reviewed importance of taking medications as prescribed,, reviewed EMMI on TIA and teachback on symptoms and action plan on notifying MD/seeking medical attention.    [facility] CM Short Term Goal #1   Patient will attend all medical appointment in the next 30 days   Interventions for Short Term Goal #1  Reviewed upcoming appointments reinforced keeping all appointment , reviewed transportation arranged.   [facility] CM Short Term Goal #2   Patient will be able increase in balance and strenght over the next 30 days   [facility] CM Short Term Goal #2 Start Date  09/28/17  Interventions for Short Term Goal #2  Will continue to participate in home health therapy sessions and at home therapy exercises.      [name], RN, [place] Management Coordinator  [phone]- Mobile [phone]- Toll Free Main Office

## 2017-09-29 DIAGNOSIS — I480 Paroxysmal atrial fibrillation: Secondary | ICD-10-CM | POA: Diagnosis not present

## 2017-09-29 DIAGNOSIS — I110 Hypertensive heart disease with heart failure: Secondary | ICD-10-CM | POA: Diagnosis not present

## 2017-09-29 DIAGNOSIS — I69354 Hemiplegia and hemiparesis following cerebral infarction affecting left non-dominant side: Secondary | ICD-10-CM | POA: Diagnosis not present

## 2017-09-29 DIAGNOSIS — E1140 Type 2 diabetes mellitus with diabetic neuropathy, unspecified: Secondary | ICD-10-CM | POA: Diagnosis not present

## 2017-09-29 DIAGNOSIS — I5032 Chronic diastolic (congestive) heart failure: Secondary | ICD-10-CM | POA: Diagnosis not present

## 2017-09-29 DIAGNOSIS — I2510 Atherosclerotic heart disease of native coronary artery without angina pectoris: Secondary | ICD-10-CM | POA: Diagnosis not present

## 2017-09-30 ENCOUNTER — Ambulatory Visit: Payer: Medicare Other | Admitting: *Deleted

## 2017-10-01 DIAGNOSIS — I480 Paroxysmal atrial fibrillation: Secondary | ICD-10-CM | POA: Diagnosis not present

## 2017-10-01 DIAGNOSIS — E782 Mixed hyperlipidemia: Secondary | ICD-10-CM | POA: Diagnosis not present

## 2017-10-01 DIAGNOSIS — E1140 Type 2 diabetes mellitus with diabetic neuropathy, unspecified: Secondary | ICD-10-CM | POA: Diagnosis not present

## 2017-10-01 DIAGNOSIS — G459 Transient cerebral ischemic attack, unspecified: Secondary | ICD-10-CM | POA: Diagnosis not present

## 2017-10-01 DIAGNOSIS — J449 Chronic obstructive pulmonary disease, unspecified: Secondary | ICD-10-CM | POA: Diagnosis not present

## 2017-10-01 DIAGNOSIS — I69354 Hemiplegia and hemiparesis following cerebral infarction affecting left non-dominant side: Secondary | ICD-10-CM | POA: Diagnosis not present

## 2017-10-01 DIAGNOSIS — Z720 Tobacco use: Secondary | ICD-10-CM | POA: Diagnosis not present

## 2017-10-01 DIAGNOSIS — D649 Anemia, unspecified: Secondary | ICD-10-CM | POA: Diagnosis not present

## 2017-10-01 DIAGNOSIS — Z09 Encounter for follow-up examination after completed treatment for conditions other than malignant neoplasm: Secondary | ICD-10-CM | POA: Diagnosis not present

## 2017-10-01 DIAGNOSIS — Z8673 Personal history of transient ischemic attack (TIA), and cerebral infarction without residual deficits: Secondary | ICD-10-CM | POA: Diagnosis not present

## 2017-10-01 DIAGNOSIS — R262 Difficulty in walking, not elsewhere classified: Secondary | ICD-10-CM | POA: Diagnosis not present

## 2017-10-01 DIAGNOSIS — I2510 Atherosclerotic heart disease of native coronary artery without angina pectoris: Secondary | ICD-10-CM | POA: Diagnosis not present

## 2017-10-01 DIAGNOSIS — I5032 Chronic diastolic (congestive) heart failure: Secondary | ICD-10-CM | POA: Diagnosis not present

## 2017-10-01 DIAGNOSIS — Z8679 Personal history of other diseases of the circulatory system: Secondary | ICD-10-CM | POA: Diagnosis not present

## 2017-10-01 DIAGNOSIS — I10 Essential (primary) hypertension: Secondary | ICD-10-CM | POA: Diagnosis not present

## 2017-10-01 DIAGNOSIS — I110 Hypertensive heart disease with heart failure: Secondary | ICD-10-CM | POA: Diagnosis not present

## 2017-10-07 ENCOUNTER — Telehealth: Payer: Self-pay | Admitting: *Deleted

## 2017-10-07 NOTE — Telephone Encounter (Signed)
Contacted regarding rescheduling lung screening scan. Patient requests to be rescheduled for 10/27/17 at 10am.

## 2017-10-09 ENCOUNTER — Other Ambulatory Visit: Payer: Self-pay | Admitting: *Deleted

## 2017-10-09 DIAGNOSIS — I110 Hypertensive heart disease with heart failure: Secondary | ICD-10-CM | POA: Diagnosis not present

## 2017-10-09 DIAGNOSIS — I480 Paroxysmal atrial fibrillation: Secondary | ICD-10-CM | POA: Diagnosis not present

## 2017-10-09 DIAGNOSIS — I2510 Atherosclerotic heart disease of native coronary artery without angina pectoris: Secondary | ICD-10-CM | POA: Diagnosis not present

## 2017-10-09 DIAGNOSIS — I69354 Hemiplegia and hemiparesis following cerebral infarction affecting left non-dominant side: Secondary | ICD-10-CM | POA: Diagnosis not present

## 2017-10-09 DIAGNOSIS — E1140 Type 2 diabetes mellitus with diabetic neuropathy, unspecified: Secondary | ICD-10-CM | POA: Diagnosis not present

## 2017-10-09 DIAGNOSIS — I5032 Chronic diastolic (congestive) heart failure: Secondary | ICD-10-CM | POA: Diagnosis not present

## 2017-10-09 NOTE — Patient Outreach (Addendum)
[facility]) Care Management  10/09/2017  Samantha Aguirre July 17, 1945 [id number]    Phone call to patient to follow up on persona care services for patient. Per patient she has not received a phone call regarding the request for an assessment for personal care services. Patient did say she was able to get adult diapers through active style [employer] and she has the paperwork to apply for a government phone. Patient has a motorized wheelchair assessment on 11/04/17. Patient stated that at this point, she is not interested in [facility] care services at this time, stating that she feels that she can take care of her own personal care needs. Per patient, "I would rather do things on my own". " I would like to try".  Per patient, if there was ever a point where she felt like she could not take care of herself, she will  contact this Education officer, museum for assistance.  Plan: This social worker will close patient at this time to social work. Patient encouraged to call this social worker back if there are any concerns in the future.  [name] [facility] Care Management [phone]

## 2017-10-12 DIAGNOSIS — E1140 Type 2 diabetes mellitus with diabetic neuropathy, unspecified: Secondary | ICD-10-CM | POA: Diagnosis not present

## 2017-10-12 DIAGNOSIS — I10 Essential (primary) hypertension: Secondary | ICD-10-CM | POA: Diagnosis not present

## 2017-10-12 DIAGNOSIS — Z8719 Personal history of other diseases of the digestive system: Secondary | ICD-10-CM | POA: Diagnosis not present

## 2017-10-12 DIAGNOSIS — R141 Gas pain: Secondary | ICD-10-CM | POA: Diagnosis not present

## 2017-10-12 DIAGNOSIS — E785 Hyperlipidemia, unspecified: Secondary | ICD-10-CM | POA: Diagnosis not present

## 2017-10-12 DIAGNOSIS — R197 Diarrhea, unspecified: Secondary | ICD-10-CM | POA: Diagnosis not present

## 2017-10-12 DIAGNOSIS — D500 Iron deficiency anemia secondary to blood loss (chronic): Secondary | ICD-10-CM | POA: Diagnosis not present

## 2017-10-13 ENCOUNTER — Other Ambulatory Visit: Payer: Self-pay | Admitting: Family Medicine

## 2017-10-13 DIAGNOSIS — I110 Hypertensive heart disease with heart failure: Secondary | ICD-10-CM | POA: Diagnosis not present

## 2017-10-13 DIAGNOSIS — E1140 Type 2 diabetes mellitus with diabetic neuropathy, unspecified: Secondary | ICD-10-CM | POA: Diagnosis not present

## 2017-10-13 DIAGNOSIS — G43919 Migraine, unspecified, intractable, without status migrainosus: Secondary | ICD-10-CM

## 2017-10-13 DIAGNOSIS — I2510 Atherosclerotic heart disease of native coronary artery without angina pectoris: Secondary | ICD-10-CM | POA: Diagnosis not present

## 2017-10-13 DIAGNOSIS — I5032 Chronic diastolic (congestive) heart failure: Secondary | ICD-10-CM | POA: Diagnosis not present

## 2017-10-13 DIAGNOSIS — I480 Paroxysmal atrial fibrillation: Secondary | ICD-10-CM | POA: Diagnosis not present

## 2017-10-13 DIAGNOSIS — I69354 Hemiplegia and hemiparesis following cerebral infarction affecting left non-dominant side: Secondary | ICD-10-CM | POA: Diagnosis not present

## 2017-10-14 DIAGNOSIS — I110 Hypertensive heart disease with heart failure: Secondary | ICD-10-CM | POA: Diagnosis not present

## 2017-10-14 DIAGNOSIS — I69354 Hemiplegia and hemiparesis following cerebral infarction affecting left non-dominant side: Secondary | ICD-10-CM | POA: Diagnosis not present

## 2017-10-14 DIAGNOSIS — I480 Paroxysmal atrial fibrillation: Secondary | ICD-10-CM | POA: Diagnosis not present

## 2017-10-14 DIAGNOSIS — E1140 Type 2 diabetes mellitus with diabetic neuropathy, unspecified: Secondary | ICD-10-CM | POA: Diagnosis not present

## 2017-10-14 DIAGNOSIS — I5032 Chronic diastolic (congestive) heart failure: Secondary | ICD-10-CM | POA: Diagnosis not present

## 2017-10-14 DIAGNOSIS — I2510 Atherosclerotic heart disease of native coronary artery without angina pectoris: Secondary | ICD-10-CM | POA: Diagnosis not present

## 2017-10-15 DIAGNOSIS — I69354 Hemiplegia and hemiparesis following cerebral infarction affecting left non-dominant side: Secondary | ICD-10-CM | POA: Diagnosis not present

## 2017-10-15 DIAGNOSIS — I480 Paroxysmal atrial fibrillation: Secondary | ICD-10-CM | POA: Diagnosis not present

## 2017-10-15 DIAGNOSIS — E1140 Type 2 diabetes mellitus with diabetic neuropathy, unspecified: Secondary | ICD-10-CM | POA: Diagnosis not present

## 2017-10-15 DIAGNOSIS — I2510 Atherosclerotic heart disease of native coronary artery without angina pectoris: Secondary | ICD-10-CM | POA: Diagnosis not present

## 2017-10-15 DIAGNOSIS — I110 Hypertensive heart disease with heart failure: Secondary | ICD-10-CM | POA: Diagnosis not present

## 2017-10-15 DIAGNOSIS — I5032 Chronic diastolic (congestive) heart failure: Secondary | ICD-10-CM | POA: Diagnosis not present

## 2017-10-16 DIAGNOSIS — E1140 Type 2 diabetes mellitus with diabetic neuropathy, unspecified: Secondary | ICD-10-CM | POA: Diagnosis not present

## 2017-10-16 DIAGNOSIS — I69354 Hemiplegia and hemiparesis following cerebral infarction affecting left non-dominant side: Secondary | ICD-10-CM | POA: Diagnosis not present

## 2017-10-16 DIAGNOSIS — I2510 Atherosclerotic heart disease of native coronary artery without angina pectoris: Secondary | ICD-10-CM | POA: Diagnosis not present

## 2017-10-16 DIAGNOSIS — I5032 Chronic diastolic (congestive) heart failure: Secondary | ICD-10-CM | POA: Diagnosis not present

## 2017-10-16 DIAGNOSIS — I110 Hypertensive heart disease with heart failure: Secondary | ICD-10-CM | POA: Diagnosis not present

## 2017-10-16 DIAGNOSIS — I480 Paroxysmal atrial fibrillation: Secondary | ICD-10-CM | POA: Diagnosis not present

## 2017-10-19 DIAGNOSIS — I2510 Atherosclerotic heart disease of native coronary artery without angina pectoris: Secondary | ICD-10-CM | POA: Diagnosis not present

## 2017-10-19 DIAGNOSIS — I110 Hypertensive heart disease with heart failure: Secondary | ICD-10-CM | POA: Diagnosis not present

## 2017-10-19 DIAGNOSIS — E1140 Type 2 diabetes mellitus with diabetic neuropathy, unspecified: Secondary | ICD-10-CM | POA: Diagnosis not present

## 2017-10-19 DIAGNOSIS — I5032 Chronic diastolic (congestive) heart failure: Secondary | ICD-10-CM | POA: Diagnosis not present

## 2017-10-19 DIAGNOSIS — I480 Paroxysmal atrial fibrillation: Secondary | ICD-10-CM | POA: Diagnosis not present

## 2017-10-19 DIAGNOSIS — I69354 Hemiplegia and hemiparesis following cerebral infarction affecting left non-dominant side: Secondary | ICD-10-CM | POA: Diagnosis not present

## 2017-10-20 ENCOUNTER — Emergency Department: Payer: Medicare Other

## 2017-10-20 ENCOUNTER — Observation Stay
Admission: EM | Admit: 2017-10-20 | Discharge: 2017-10-21 | Disposition: A | Discharge status: Home Health | Payer: Medicare Other | Attending: Internal Medicine | Admitting: Internal Medicine

## 2017-10-20 ENCOUNTER — Observation Stay: Admit: 2017-10-20 | Payer: Medicare Other

## 2017-10-20 ENCOUNTER — Other Ambulatory Visit: Payer: Self-pay

## 2017-10-20 ENCOUNTER — Other Ambulatory Visit: Payer: Self-pay | Admitting: Family Medicine

## 2017-10-20 DIAGNOSIS — G8929 Other chronic pain: Secondary | ICD-10-CM

## 2017-10-20 DIAGNOSIS — I5032 Chronic diastolic (congestive) heart failure: Secondary | ICD-10-CM | POA: Insufficient documentation

## 2017-10-20 DIAGNOSIS — I480 Paroxysmal atrial fibrillation: Secondary | ICD-10-CM | POA: Diagnosis not present

## 2017-10-20 DIAGNOSIS — E785 Hyperlipidemia, unspecified: Secondary | ICD-10-CM | POA: Diagnosis not present

## 2017-10-20 DIAGNOSIS — E039 Hypothyroidism, unspecified: Secondary | ICD-10-CM | POA: Diagnosis not present

## 2017-10-20 DIAGNOSIS — I700 Atherosclerosis of aorta: Secondary | ICD-10-CM | POA: Insufficient documentation

## 2017-10-20 DIAGNOSIS — G40909 Epilepsy, unspecified, not intractable, without status epilepticus: Secondary | ICD-10-CM | POA: Insufficient documentation

## 2017-10-20 DIAGNOSIS — I951 Orthostatic hypotension: Secondary | ICD-10-CM | POA: Insufficient documentation

## 2017-10-20 DIAGNOSIS — M170 Bilateral primary osteoarthritis of knee: Secondary | ICD-10-CM | POA: Insufficient documentation

## 2017-10-20 DIAGNOSIS — Z885 Allergy status to narcotic agent status: Secondary | ICD-10-CM | POA: Diagnosis not present

## 2017-10-20 DIAGNOSIS — K219 Gastro-esophageal reflux disease without esophagitis: Secondary | ICD-10-CM | POA: Insufficient documentation

## 2017-10-20 DIAGNOSIS — M19072 Primary osteoarthritis, left ankle and foot: Secondary | ICD-10-CM | POA: Diagnosis not present

## 2017-10-20 DIAGNOSIS — R531 Weakness: Secondary | ICD-10-CM | POA: Diagnosis not present

## 2017-10-20 DIAGNOSIS — M19071 Primary osteoarthritis, right ankle and foot: Secondary | ICD-10-CM | POA: Diagnosis not present

## 2017-10-20 DIAGNOSIS — I482 Chronic atrial fibrillation, unspecified: Secondary | ICD-10-CM | POA: Insufficient documentation

## 2017-10-20 DIAGNOSIS — Z886 Allergy status to analgesic agent status: Secondary | ICD-10-CM | POA: Insufficient documentation

## 2017-10-20 DIAGNOSIS — G43919 Migraine, unspecified, intractable, without status migrainosus: Secondary | ICD-10-CM

## 2017-10-20 DIAGNOSIS — E860 Dehydration: Secondary | ICD-10-CM | POA: Insufficient documentation

## 2017-10-20 DIAGNOSIS — M19042 Primary osteoarthritis, left hand: Secondary | ICD-10-CM | POA: Insufficient documentation

## 2017-10-20 DIAGNOSIS — Z66 Do not resuscitate: Secondary | ICD-10-CM | POA: Insufficient documentation

## 2017-10-20 DIAGNOSIS — I110 Hypertensive heart disease with heart failure: Secondary | ICD-10-CM | POA: Diagnosis not present

## 2017-10-20 DIAGNOSIS — J410 Simple chronic bronchitis: Secondary | ICD-10-CM

## 2017-10-20 DIAGNOSIS — Z8249 Family history of ischemic heart disease and other diseases of the circulatory system: Secondary | ICD-10-CM | POA: Insufficient documentation

## 2017-10-20 DIAGNOSIS — G459 Transient cerebral ischemic attack, unspecified: Principal | ICD-10-CM | POA: Insufficient documentation

## 2017-10-20 DIAGNOSIS — J449 Chronic obstructive pulmonary disease, unspecified: Secondary | ICD-10-CM

## 2017-10-20 DIAGNOSIS — I2510 Atherosclerotic heart disease of native coronary artery without angina pectoris: Secondary | ICD-10-CM | POA: Diagnosis not present

## 2017-10-20 DIAGNOSIS — Z85828 Personal history of other malignant neoplasm of skin: Secondary | ICD-10-CM | POA: Insufficient documentation

## 2017-10-20 DIAGNOSIS — Z8673 Personal history of transient ischemic attack (TIA), and cerebral infarction without residual deficits: Secondary | ICD-10-CM | POA: Insufficient documentation

## 2017-10-20 DIAGNOSIS — E1140 Type 2 diabetes mellitus with diabetic neuropathy, unspecified: Secondary | ICD-10-CM | POA: Diagnosis not present

## 2017-10-20 DIAGNOSIS — F17210 Nicotine dependence, cigarettes, uncomplicated: Secondary | ICD-10-CM | POA: Insufficient documentation

## 2017-10-20 DIAGNOSIS — R29810 Facial weakness: Secondary | ICD-10-CM | POA: Diagnosis not present

## 2017-10-20 DIAGNOSIS — M19041 Primary osteoarthritis, right hand: Secondary | ICD-10-CM | POA: Diagnosis not present

## 2017-10-20 DIAGNOSIS — Z79899 Other long term (current) drug therapy: Secondary | ICD-10-CM | POA: Insufficient documentation

## 2017-10-20 DIAGNOSIS — Z91041 Radiographic dye allergy status: Secondary | ICD-10-CM | POA: Insufficient documentation

## 2017-10-20 DIAGNOSIS — R4781 Slurred speech: Secondary | ICD-10-CM | POA: Diagnosis not present

## 2017-10-20 DIAGNOSIS — F0390 Unspecified dementia without behavioral disturbance: Secondary | ICD-10-CM | POA: Insufficient documentation

## 2017-10-20 DIAGNOSIS — Z7982 Long term (current) use of aspirin: Secondary | ICD-10-CM | POA: Insufficient documentation

## 2017-10-20 DIAGNOSIS — I959 Hypotension, unspecified: Secondary | ICD-10-CM | POA: Diagnosis not present

## 2017-10-20 DIAGNOSIS — I639 Cerebral infarction, unspecified: Secondary | ICD-10-CM | POA: Diagnosis not present

## 2017-10-20 DIAGNOSIS — Z888 Allergy status to other drugs, medicaments and biological substances status: Secondary | ICD-10-CM | POA: Insufficient documentation

## 2017-10-20 DIAGNOSIS — I872 Venous insufficiency (chronic) (peripheral): Secondary | ICD-10-CM | POA: Diagnosis not present

## 2017-10-20 DIAGNOSIS — M545 Low back pain: Secondary | ICD-10-CM

## 2017-10-20 LAB — DIFFERENTIAL
Abs Immature Granulocytes: 0.03 10*3/uL (ref 0.00–0.07)
BASOS ABS: 0.1 10*3/uL (ref 0.0–0.1)
BASOS PCT: 1 %
EOS ABS: 0.1 10*3/uL (ref 0.0–0.5)
EOS PCT: 1 %
Immature Granulocytes: 0 %
Lymphocytes Relative: 32 %
Lymphs Abs: 3 10*3/uL (ref 0.7–4.0)
MONO ABS: 0.8 10*3/uL (ref 0.1–1.0)
Monocytes Relative: 9 %
NEUTROS ABS: 5.5 10*3/uL (ref 1.7–7.7)
NEUTROS PCT: 57 %

## 2017-10-20 LAB — CBC
HCT: 39.3 % (ref 36.0–46.0)
Hemoglobin: 12.6 g/dL (ref 12.0–15.0)
MCH: 27.6 pg (ref 26.0–34.0)
MCHC: 32.1 g/dL (ref 30.0–36.0)
MCV: 86.2 fL (ref 80.0–100.0)
PLATELETS: 278 10*3/uL (ref 150–400)
RBC: 4.56 MIL/uL (ref 3.87–5.11)
RDW: 16.1 % — AB (ref 11.5–15.5)
WBC: 9.6 10*3/uL (ref 4.0–10.5)
nRBC: 0 % (ref 0.0–0.2)

## 2017-10-20 LAB — COMPREHENSIVE METABOLIC PANEL
ALT: 14 U/L (ref 0–44)
ANION GAP: 9 (ref 5–15)
AST: 20 U/L (ref 15–41)
Albumin: 4 g/dL (ref 3.5–5.0)
Alkaline Phosphatase: 99 U/L (ref 38–126)
BUN: 10 mg/dL (ref 8–23)
CHLORIDE: 108 mmol/L (ref 98–111)
CO2: 24 mmol/L (ref 22–32)
Calcium: 9 mg/dL (ref 8.9–10.3)
Creatinine, Ser: 0.71 mg/dL (ref 0.44–1.00)
GFR calc Af Amer: >60 mL/min (ref >60)
Glucose, Bld: 79 mg/dL (ref 70–99)
Potassium: 3.4 mmol/L — ABNORMAL LOW (ref 3.5–5.1)
SODIUM: 141 mmol/L (ref 135–145)
Total Bilirubin: 0.4 mg/dL (ref 0.3–1.2)
Total Protein: 6.7 g/dL (ref 6.5–8.1)

## 2017-10-20 LAB — PROTIME-INR
INR: 0.88
Prothrombin Time: 11.9 seconds (ref 11.4–15.2)

## 2017-10-20 LAB — TROPONIN I

## 2017-10-20 LAB — GLUCOSE, CAPILLARY
GLUCOSE-CAPILLARY: 74 mg/dL (ref 70–99)
GLUCOSE-CAPILLARY: 72 mg/dL (ref 70–99)
GLUCOSE-CAPILLARY: 100 mg/dL — AB (ref 70–99)

## 2017-10-20 LAB — APTT: APTT: 27 s (ref 24–36)

## 2017-10-20 LAB — MRSA PCR SCREENING: MRSA BY PCR: POSITIVE — AB

## 2017-10-20 MED ORDER — ENOXAPARIN SODIUM 40 MG/0.4ML ~~LOC~~ SOLN
40.0000 mg | SUBCUTANEOUS | Status: DC
  Administered 2017-10-20: 40 mg via SUBCUTANEOUS
  Filled 2017-10-20: qty 0.4

## 2017-10-20 MED ORDER — SODIUM CHLORIDE 0.9 % IV SOLN
500.0000 mg | Freq: Two times a day (BID) | INTRAVENOUS | Status: DC
  Administered 2017-10-20 – 2017-10-21 (×2): 500 mg via INTRAVENOUS
  Filled 2017-10-20 (×2): qty 5
  Filled 2017-10-20: qty 525
  Filled 2017-10-20: qty 5

## 2017-10-20 MED ORDER — SODIUM CHLORIDE 0.9 % IV BOLUS
500.0000 mL | Freq: Once | INTRAVENOUS | Status: AC
  Administered 2017-10-20: 500 mL via INTRAVENOUS

## 2017-10-20 MED ORDER — SENNOSIDES-DOCUSATE SODIUM 8.6-50 MG PO TABS: 1.0000 | ORAL_TABLET | Freq: Every evening | ORAL | Status: DC | PRN

## 2017-10-20 MED ORDER — LEVETIRACETAM IN NACL 500 MG/100ML IV SOLN
500.0000 mg | Freq: Two times a day (BID) | INTRAVENOUS | Status: DC
  Filled 2017-10-20 (×2): qty 100

## 2017-10-20 MED ORDER — CHLORHEXIDINE GLUCONATE CLOTH 2 % EX PADS
6.0000 | MEDICATED_PAD | Freq: Every day | CUTANEOUS | Status: DC
  Administered 2017-10-21: 6 via TOPICAL

## 2017-10-20 MED ORDER — INSULIN ASPART 100 UNIT/ML ~~LOC~~ SOLN
0.0000 [IU] | Freq: Three times a day (TID) | SUBCUTANEOUS | Status: DC
  Administered 2017-10-21: 1 [IU] via SUBCUTANEOUS
  Filled 2017-10-20: qty 1

## 2017-10-20 MED ORDER — STROKE: EARLY STAGES OF RECOVERY BOOK
Freq: Once | Status: AC
  Administered 2017-10-20: 21:00:00

## 2017-10-20 MED ORDER — LORAZEPAM 2 MG/ML IJ SOLN: 1.0000 mg | INTRAMUSCULAR | Status: DC | PRN

## 2017-10-20 MED ORDER — LORAZEPAM 2 MG/ML IJ SOLN
1.0000 mg | Freq: Once | INTRAMUSCULAR | Status: AC
  Administered 2017-10-20: 1 mg via INTRAVENOUS
  Filled 2017-10-20: qty 1

## 2017-10-20 MED ORDER — ASPIRIN EC 325 MG PO TBEC
325.0000 mg | DELAYED_RELEASE_TABLET | Freq: Every day | ORAL | Status: DC
  Administered 2017-10-20 – 2017-10-21 (×2): 325 mg via ORAL
  Filled 2017-10-20 (×2): qty 1

## 2017-10-20 MED ORDER — SODIUM CHLORIDE 0.9 % IV SOLN
INTRAVENOUS | Status: DC
  Administered 2017-10-20: 19:00:00 via INTRAVENOUS

## 2017-10-20 MED ORDER — MUPIROCIN 2 % EX OINT
1.0000 "application " | TOPICAL_OINTMENT | Freq: Two times a day (BID) | CUTANEOUS | Status: DC
  Administered 2017-10-20 – 2017-10-21 (×2): 1 via NASAL
  Filled 2017-10-20: qty 22

## 2017-10-20 MED ORDER — ASPIRIN 300 MG RE SUPP: 300.0000 mg | Freq: Every day | RECTAL | Status: DC

## 2017-10-20 NOTE — ED Provider Notes (Signed)
[facility] Emergency Department Provider Note   ____________________________________________   First MD Initiated Contact with Patient 10/20/17 1400     (approximate)  I have reviewed the triage vital signs and the nursing notes.   HISTORY  Chief Complaint Code Stroke    HPI Samantha Aguirre is a 72 y.o. female patient comes from clinic with left-sided facial droop left arm weakness and slurry speech.  She also has difficulty opening left eye and some headache.  Headache is not severe.  Symptoms started about 1130.  Patient seen by Dr. Doy Mince neurology initial CT is negative MRI reports pending read patient's blood pressure was low initially is come up somewhat with fluids.  Patient required sedation for MRI.   Past Medical History:  Diagnosis Date  . Acid reflux   . Arthritis    Bilateral knees, hands, feet  . Asthma   . Atrial fibrillation ([place])   . Cancer ([place])   . CHF (congestive heart failure) (HCC)    Diastolic CHF  . COPD (chronic obstructive pulmonary disease) ([place])   . Coronary artery disease   . Diabetes mellitus without complication ([place])   . Frequent headaches   . GI bleed   . HLD (hyperlipidemia)   . Hypertension   . Seizures ([place])   . Skin cancer 2015   Suspected basal cell carcinoma on nose, surgically removed  . Stroke ([place]) 04/2017  . Thyroid disease   . Tremors of nervous system     Patient Active Problem List   Diagnosis Date Noted  . TIA (transient ischemic attack) 09/22/2017  . PAF (paroxysmal atrial fibrillation) ([place])   . Acute CVA (cerebrovascular accident) ([place]) 07/30/2017  . GI bleed 07/08/2017  . Anemia 07/08/2017  . Angiodysplasia of stomach and duodenum with hemorrhage   . CVA (cerebral vascular accident) ([place]) 07/06/2017  . Stroke (cerebrum) ([place])   . Goals of care, counseling/discussion   . Palliative care encounter   . COPD exacerbation ([place]) 04/03/2017  . Flu 03/27/2017  . Iron deficiency anemia  due to chronic blood loss   . Migraine 02/13/2017  . Cataracts, bilateral 01/15/2017  . Chronic venous insufficiency 10/06/2016  . Recurrent major depressive disorder, in partial remission ([place]) 10/06/2016  . Left-sided weakness 10/03/2016  . Chest pain 02/27/2016  . History of stroke 02/19/2016  . Thumb pain, left 01/22/2016  . Traumatic closed nondisplaced fracture of base of metacarpal bone of left thumb 01/22/2016  . Colon cancer screening 11/27/2015  . Depression 11/14/2015  . Coronary artery disease 11/13/2015  . Hypertension 11/13/2015  . History of skin cancer 11/13/2015  . Osteoporosis 11/13/2015  . Chronic low back pain 11/13/2015  . Left medial knee pain 11/13/2015  . Osteoarthritis of multiple joints 11/13/2015  . Other specified hypothyroidism 08/16/2015  . Controlled type 2 diabetes mellitus with diabetic neuropathy ([place]) 08/16/2015  . COPD (chronic obstructive pulmonary disease) ([place]) 08/16/2015  . Tobacco abuse 08/16/2015  . HLD (hyperlipidemia) 08/16/2015  . History of CHF (congestive heart failure) 08/16/2015  . Seizure disorder [facility])     Past Surgical History:  Procedure Laterality Date  . ABDOMINAL HYSTERECTOMY  1976  . APPENDECTOMY  1980  . CAROTID ENDARTERECTOMY    . CHOLECYSTECTOMY  1980  . COLONOSCOPY N/A 02/20/2017   Procedure: COLONOSCOPY;  Surgeon: Lin Landsman, MD;  Location: [facility] ENDOSCOPY;  Service: Gastroenterology;  Laterality: N/A;  . ESOPHAGOGASTRODUODENOSCOPY N/A 02/20/2017   Procedure: ESOPHAGOGASTRODUODENOSCOPY (EGD);  Surgeon: Lin Landsman, MD;  Location:  [facility] ENDOSCOPY;  Service: Gastroenterology;  Laterality: N/A;  . ESOPHAGOGASTRODUODENOSCOPY (EGD) WITH PROPOFOL N/A 07/08/2017   Procedure: ESOPHAGOGASTRODUODENOSCOPY (EGD) WITH PROPOFOL;  Surgeon: [name], MD;  Location: [facility] ENDOSCOPY;  Service: Endoscopy;  Laterality: N/A;  . [place]  . GIVENS CAPSULE STUDY N/A 07/09/2017   Procedure: GIVENS CAPSULE  STUDY;  Surgeon: Jonathon Bellows, MD;  Location: [facility] ENDOSCOPY;  Service: Gastroenterology;  Laterality: N/A;  . KNEE SURGERY     left  . ROTATOR CUFF REPAIR     right  . TONSILLECTOMY  1950    Prior to Admission medications   Medication Sig Start Date End Date Taking? Authorizing Provider  albuterol (PROVENTIL HFA;VENTOLIN HFA) 108 (90 Base) MCG/ACT inhaler Inhale 2 puffs into the lungs every 6 (six) hours as needed for wheezing or shortness of breath. 10/08/16  Yes Karamalegos, Devonne Doughty, DO  atorvastatin (LIPITOR) 80 MG tablet Take 1 tablet (80 mg total) by mouth daily at 6 PM. 09/24/16  Yes Karamalegos, Devonne Doughty, DO  colestipol (COLESTID) 1 g tablet Take 2 g by mouth 2 (two) times daily. 10/13/17  Yes [provider]  DULoxetine (CYMBALTA) 60 MG capsule Take 1 capsule (60 mg total) by mouth at bedtime. 09/24/16  Yes Karamalegos, Devonne Doughty, DO  ferrous sulfate 325 (65 FE) MG EC tablet Take 1 tablet (325 mg total) by mouth 2 (two) times daily with a meal. Patient taking differently: Take 325 mg by mouth daily.  02/21/17  Yes Sudini, Alveta Heimlich, MD  fluticasone (FLONASE) 50 MCG/ACT nasal spray Place 1 spray into both nostrils daily. 10/08/16  Yes Karamalegos, Devonne Doughty, DO  furosemide (LASIX) 20 MG tablet Take 20 mg by mouth daily. 10/13/17  Yes [provider]  gabapentin (NEURONTIN) 800 MG tablet Take 1 tablet (800 mg total) by mouth 2 (two) times daily. 09/24/16  Yes Karamalegos, Devonne Doughty, DO  glipiZIDE-metformin (METAGLIP) 5-500 MG tablet Take 1 tablet by mouth 2 (two) times daily before a meal. Patient taking differently: Take 1-2 tablets by mouth See admin instructions. Take 2 tablets by mouth every morning and 1 tablet by mouth every night 04/02/17  Yes Karamalegos, Devonne Doughty, DO  levETIRAcetam (KEPPRA) 500 MG tablet Take 1 tablet (500 mg total) by mouth 2 (two) times daily. 09/19/15  Yes Epifanio Lesches, MD  levothyroxine (SYNTHROID, LEVOTHROID) 112 MCG tablet Take 1  tablet (112 mcg total) by mouth daily before breakfast. 09/24/17  Yes Vann, Jessica U, DO  lisinopril (PRINIVIL,ZESTRIL) 10 MG tablet Take 10 mg by mouth daily. 10/13/17  Yes [provider]  nitroGLYCERIN (NITROSTAT) 0.4 MG SL tablet Place 1 tablet (0.4 mg total) under the tongue every 5 (five) minutes as needed for chest pain. 02/28/16  Yes Mody, Ulice Bold, MD  omeprazole (PRILOSEC) 20 MG capsule Take 20 mg by mouth 2 (two) times daily. 09/02/17  Yes [provider]  potassium chloride SA (K-DUR,KLOR-CON) 20 MEQ tablet Take 20 mEq by mouth 2 (two) times daily.   Yes [provider]  sucralfate (CARAFATE) 1 g tablet TAKE (1) TABLET BY MOUTH FOUR TIMES A DAY Patient taking differently: Take 1 g by mouth 4 (four) times daily.  03/26/17  Yes Karamalegos, Devonne Doughty, DO  topiramate (TOPAMAX) 25 MG tablet Take 25 mg by mouth 2 (two) times daily. 10/13/17  Yes [provider]  vitamin B-12 (CYANOCOBALAMIN) 1000 MCG tablet Take 1 tablet by mouth daily. 06/29/17  Yes [provider]  aspirin 325 MG tablet Take 1 tablet (325  mg total) by mouth daily. Patient not taking: Reported on 10/20/2017 08/03/17   Niel Hummer A, MD  blood glucose meter kit and supplies Dispense based on patient and insurance preference. Use up to four times daily as directed. (FOR ICD-10 E10.9, E11.9). Patient not taking: Reported on 09/28/2017 08/03/17   Regalado, Jerald Kief A, MD  diclofenac sodium (VOLTAREN) 1 % GEL Apply 2 g topically 4 (four) times daily. Patient not taking: Reported on 10/20/2017 01/11/16   Daymon Larsen, MD  feeding supplement, GLUCERNA SHAKE, (GLUCERNA SHAKE) LIQD Take 237 mLs by mouth 2 (two) times daily between meals. Patient not taking: Reported on 09/28/2017 08/03/17   Regalado, Jerald Kief A, MD  pantoprazole (PROTONIX) 40 MG tablet Take 1 tablet (40 mg total) by mouth 2 (two) times daily. Patient not taking: Reported on 09/28/2017 08/03/17   Regalado, Jerald Kief A, MD  umeclidinium  bromide (INCRUSE ELLIPTA) 62.5 MCG/INH AEPB Inhale 1 puff into the lungs daily. Patient not taking: Reported on 10/20/2017 11/13/15   Olin Hauser, DO    Allergies Methotrexate; Mushroom extract complex; Atenolol; Ivp dye [iodinated diagnostic agents]; Morphine and related; Oxycodone hcl; Percocet [oxycodone-acetaminophen]; Percodan [oxycodone-aspirin]; Tramadol; and Verapamil  Family History  Problem Relation Age of Onset  . Breast cancer Mother   . Heart disease Mother   . Stroke Mother   . Cancer Mother   . COPD Mother   . Diabetes Mother   . Heart disease Father   . Diabetes Father   . Stroke Father   . Alcohol abuse Sister   . Drug abuse Sister   . Stroke Sister   . Cancer Sister   . Mental illness Sister   . Heart disease Brother   . Arthritis Brother   . Diabetes Brother   . Heart disease Maternal Grandfather   . Heart disease Paternal Grandfather     Social History Social History   Tobacco Use  . Smoking status: Current Every Day Smoker    Packs/day: 1.00    Years: 53.00    Pack years: 53.00    Types: Cigarettes  . Smokeless tobacco: Never Used  Substance Use Topics  . Alcohol use: No  . Drug use: No    Review of Systems  Constitutional: No fever/chills Eyes: No visual changes. ENT: No sore throat. Cardiovascular: Denies chest pain. Respiratory: Denies shortness of breath. Gastrointestinal: No abdominal pain.  No nausea, no vomiting.  No diarrhea.  No constipation. Genitourinary: Negative for dysuria. Musculoskeletal: Negative for back pain. Skin: Negative for rash. Neurological: See HPI  ____________________________________________   PHYSICAL EXAM:  VITAL SIGNS: ED Triage Vitals [10/20/17 1343]  Enc Vitals Group     BP (!) 109/54     Pulse Rate (!) 59     Resp 16     Temp 98.4 F (36.9 C)     Temp src      SpO2 99 %     Weight      Height      Head Circumference      Peak Flow      Pain Score      Pain Loc      Pain  Edu?      Excl. in [place]?     Constitutional: Alert and oriented.  There is a droopy left eyelid and her speech seems to be slightly slurry Eyes: Conjunctivae are normal. PERRL. EOMI. left eyelid droop be Head: Atraumatic. Nose: No congestion/rhinnorhea. Mouth/Throat: Mucous membranes are moist.  Oropharynx non-erythematous. Neck:  No stridor.   Cardiovascular: Normal rate, regular rhythm. Grossly normal heart sounds.  Good peripheral circulation. Respiratory: Normal respiratory effort.  No retractions. Lungs CTAB. Gastrointestinal: Soft and nontender. No distention. No abdominal bruits. No CVA tenderness. Musculoskeletal: No lower extremity tenderness trace edema.   Neurologic: Left eyelid droopiness some left facial numbness and left hand weakness.  This difficult to tell if this is from her previous strokes or new.  Patient herself just feels worse. Skin:  Skin is warm, dry and intact. No rash noted. Psychiatric: Mood and affect are normal. Speech and behavior are normal.  ____________________________________________   LABS (all labs ordered are listed, but only abnormal results are displayed)  Labs Reviewed  CBC - Abnormal; Notable for the following components:      Result Value   RDW 16.1 (*)    All other components within normal limits  COMPREHENSIVE METABOLIC PANEL - Abnormal; Notable for the following components:   Potassium 3.4 (*)    All other components within normal limits  PROTIME-INR  APTT  DIFFERENTIAL  TROPONIN I  GLUCOSE, CAPILLARY  URINALYSIS, COMPLETE (UACMP) WITH MICROSCOPIC  CBG MONITORING, ED   ____________________________________________  EKG  AG read and interpreted by me shows normal sinus rhythm rate of 76 normal axis no acute ST-T wave changes ____________________________________________  RADIOLOGY  ED MD interpretation: CT and MRI read by radiology is no acute disease  Official radiology report(s): [name]  Result Date:  10/20/2017 CLINICAL DATA:  Stroke EXAM: MRI HEAD WITHOUT CONTRAST TECHNIQUE: Multiplanar, multiecho pulse sequences of the brain and surrounding structures were obtained without intravenous contrast. COMPARISON:  CT head 10/20/2017, MRI head 09/22/2017 FINDINGS: Brain: Negative for acute infarct. Multiple areas of chronic ischemia. Chronic infarct left parietal lobe with mild hemorrhage. Chronic microvascular ischemic change throughout the white matter. Chronic infarcts in the cerebellum bilaterally. 1 cm right parietal parasagittal mass compatible with meningioma is unchanged. Vascular: Normal arterial flow voids Skull and upper cervical spine: Negative Sinuses/Orbits: Negative Other: None IMPRESSION: Negative for acute infarct. No change from prior studies. Extensive chronic ischemic changes. Electronically Signed   By: Franchot Gallo M.D.   On: 10/20/2017 16:22   Ct Head Code Stroke Wo Contrast  Result Date: 10/20/2017 CLINICAL DATA:  Code stroke.  Left facial droop, slurred speech EXAM: CT HEAD WITHOUT CONTRAST TECHNIQUE: Contiguous axial images were obtained from the base of the skull through the vertex without intravenous contrast. COMPARISON:  MRI head 09/22/2017.  CT head 09/22/2017 FINDINGS: Brain: Negative for acute infarct. Negative for acute hemorrhage or mass Chronic infarct left parietal lobe. Extensive chronic microvascular ischemic change in the white matter. Small chronic infarcts in the cerebellum bilaterally. Vascular: Negative for hyperdense vessel Skull: Negative Sinuses/Orbits: Negative Other: None ASPECTS ([place] Stroke Program Early CT Score) - Ganglionic level infarction (caudate, lentiform nuclei, internal capsule, insula, M1-M3 cortex): 7 - Supraganglionic infarction (M4-M6 cortex): 3 Total score (0-10 with 10 being normal): 10 IMPRESSION: 1. No acute intracranial abnormality 2. ASPECTS is 10 3. Extensive chronic ischemic changes stable from recent studies. 4. These results were  called by telephone at the time of interpretation on 10/20/2017 at 2:00 pm to Dr. Cinda Quest, who verbally acknowledged these results. Electronically Signed   By: Franchot Gallo M.D.   On: 10/20/2017 14:01    ____________________________________________   PROCEDURES  Procedure(s) performed:   Procedures  Critical Care performed:   ____________________________________________   INITIAL IMPRESSION / ASSESSMENT AND PLAN / ED COURSE  At 530 patient  is feeling better her symptoms have resolved seems to have been a TIA however because of her past strokes we will plan on getting her in the hospital.  We will continue to evaluate her low blood pressure as well.  Has not eaten since yesterday and still not hungry.      ____________________________________________   FINAL CLINICAL IMPRESSION(S) / ED DIAGNOSES  Final diagnoses:  TIA (transient ischemic attack)     ED Discharge Orders    None       Note:  This document was prepared using Dragon voice recognition software and may include unintentional dictation errors.    Nena Polio, MD 10/20/17 [phone]

## 2017-10-20 NOTE — ED Notes (Signed)
Patient is easily awaken, but does not maintain alertness. [place] stroke swallow deferred at this time.

## 2017-10-20 NOTE — ED Triage Notes (Signed)
Pt to ED from clinic with left sided facial droop, left arm weakness, slurred speech, headache and difficulty opening left eye that pt reports started at 11:30 today. NAD noted. Pt is alert and oriented. Pt taking ASA.

## 2017-10-20 NOTE — ED Triage Notes (Signed)
Pt states she thinks she is either having a TIA or stroke.  States she has left sided weakness and reports her speech is slurred.

## 2017-10-20 NOTE — Consult Note (Signed)
Referring Physician: Conni Slipper    Chief Complaint: Left facial droop, headache, left arm weakness and decreased sensation on the left  HPI: Samantha Aguirre is an 72 y.o. female with past medical history of stroke, seizure disorder on Keppra, atrial fibrillation not currently on anticoagulation due to GI bleed, hypertension, hyperlipidemia, diabetes mellitus with complications and tobacco abuse presenting to the ED with left sided facial droop, left arm weakness, headache and difficulty opening her left eye since 11:30 am. Patient state that she had similar presentation with her prior strokes.  She denies any other associated symptoms of dizziness, speech or gait disturbance, and other focal neurologic deficit.  She reported that she was taking aspirin 325 mg once a day prior to this event.  She was previously on Eliquis for atrial fibrillation however this was stopped due to GI bleed.  Initial CT head did not show acute intracranial abnormality.  Initial NIH stroke scale 3  Date last known well: Date: 10/20/2017 Time last known well: Time: 11:30 tPA Given: No: Low NIH stroke scale of 3  Past Medical History:  Diagnosis Date  . Acid reflux   . Arthritis    Bilateral knees, hands, feet  . Asthma   . Atrial fibrillation ([place])   . Cancer ([place])   . CHF (congestive heart failure) ([facility])    Diastolic CHF  . COPD (chronic obstructive pulmonary disease) ([place])   . Coronary artery disease   . Diabetes mellitus without complication ([place])   . Frequent headaches   . GI bleed   . HLD (hyperlipidemia)   . Hypertension   . Seizures ([place])   . Skin cancer 2015   Suspected basal cell carcinoma on nose, surgically removed  . Stroke ([place]) 04/2017  . Thyroid disease   . Tremors of nervous system     Past Surgical History:  Procedure Laterality Date  . ABDOMINAL HYSTERECTOMY  1976  . APPENDECTOMY  1980  . CAROTID ENDARTERECTOMY    . CHOLECYSTECTOMY  1980  . COLONOSCOPY N/A 02/20/2017   Procedure:  COLONOSCOPY;  Surgeon: Lin Landsman, MD;  Location: [facility] ENDOSCOPY;  Service: Gastroenterology;  Laterality: N/A;  . ESOPHAGOGASTRODUODENOSCOPY N/A 02/20/2017   Procedure: ESOPHAGOGASTRODUODENOSCOPY (EGD);  Surgeon: Lin Landsman, MD;  Location[facility] ENDOSCOPY;  Service: Gastroenterology;  Laterality: N/A;  . ESOPHAGOGASTRODUODENOSCOPY (EGD) WITH PROPOFOL N/A 07/08/2017   Procedure: ESOPHAGOGASTRODUODENOSCOPY (EGD) WITH PROPOFOL;  Surgeon: [name], MD;  Location: [facility] ENDOSCOPY;  Service: Endoscopy;  Laterality: N/A;  . [place]  . GIVENS CAPSULE STUDY N/A 07/09/2017   Procedure: GIVENS CAPSULE STUDY;  Surgeon: Jonathon Bellows, MD;  Location: [facility] ENDOSCOPY;  Service: Gastroenterology;  Laterality: N/A;  . KNEE SURGERY     left  . ROTATOR CUFF REPAIR     right  . TONSILLECTOMY  1950    Family History  Problem Relation Age of Onset  . Breast cancer Mother   . Heart disease Mother   . Stroke Mother   . Cancer Mother   . COPD Mother   . Diabetes Mother   . Heart disease Father   . Diabetes Father   . Stroke Father   . Alcohol abuse Sister   . Drug abuse Sister   . Stroke Sister   . Cancer Sister   . Mental illness Sister   . Heart disease Brother   . Arthritis Brother   . Diabetes Brother   . Heart disease Maternal Grandfather   . Heart disease Paternal Grandfather  Social History:  reports that she has been smoking cigarettes. She has a 53.00 pack-year smoking history. She has never used smokeless tobacco. She reports that she does not drink alcohol or use drugs.  Allergies:  Allergies  Allergen Reactions  . Methotrexate Rash  . Mushroom Extract Complex Anaphylaxis    Made patient "deathly sick"  . Atenolol     "MD took me off of it bc it was doing something wrong" Made patient HYPOTENSIVE  . Ivp Dye [Iodinated Diagnostic Agents] Itching    Pt injected with IV contrast.  5 min after injection pt complained of itching behind ear and on her  abd.  . Morphine And Related Other (See Comments)    "stops my breathing" NEAR-RESPIRATORY ARREST  . Oxycodone Hcl   . Percocet [Oxycodone-Acetaminophen] Nausea And Vomiting and Other (See Comments)    Stomach pains  . Percodan [Oxycodone-Aspirin] Nausea And Vomiting and Other (See Comments)    Stomach pains  . Tramadol Nausea Only  . Verapamil Other (See Comments)    " MD told me this was screwing me up" MAKES PATIENT HYPOTENSIVE    Medications: I have reviewed the patient's current medications. Prior to Admission:  (Not in a hospital admission) Scheduled:   ROS: History obtained from the patient   General ROS: negative for - chills, fatigue, fever, night sweats, weight gain or weight loss Psychological ROS: negative for - behavioral disorder, hallucinations, memory difficulties, mood swings or suicidal ideation Ophthalmic ROS: negative for - blurry vision, double vision, eye pain or loss of vision ENT ROS: negative for - epistaxis, nasal discharge, oral lesions, sore throat, tinnitus or vertigo Allergy and Immunology ROS: negative for - hives or itchy/watery eyes Hematological and Lymphatic ROS: negative for - bleeding problems, bruising or swollen lymph nodes Endocrine ROS: negative for - galactorrhea, hair pattern changes, polydipsia/polyuria or temperature intolerance Respiratory ROS: negative for - cough, hemoptysis, shortness of breath or wheezing Cardiovascular ROS: negative for - chest pain, dyspnea on exertion, edema or irregular heartbeat Gastrointestinal ROS: negative for - abdominal pain, diarrhea, hematemesis, nausea/vomiting or stool incontinence Genito-Urinary ROS: negative for - dysuria, hematuria, incontinence or urinary frequency/urgency Musculoskeletal ROS: negative for - joint swelling or muscular weakness Neurological ROS: as noted in HPI Dermatological ROS: negative for rash and skin lesion changes  Physical Examination: Blood pressure (!) 109/54, pulse  (!) 59, temperature 98.4 F (36.9 C), resp. rate 16, SpO2 99 %.   HEENT-  Normocephalic, no lesions, without obvious abnormality.  Normal external eye and conjunctiva.  Normal TM's bilaterally.  Normal auditory canals and external ears. Normal external nose, mucus membranes and septum.  Normal pharynx. Cardiovascular- S1, S2 normal, pulses palpable throughout   Lungs- chest clear, no wheezing, rales, normal symmetric air entry Abdomen- soft, non-tender; bowel sounds normal; no masses,  no organomegaly Extremities- no edema Lymph-no adenopathy palpable Musculoskeletal-no joint tenderness, deformity or swelling Skin-warm and dry, no hyperpigmentation, vitiligo, or suspicious lesions  Neurological Exam   Mental Status: Alert, oriented, thought content appropriate.  Speech fluent without evidence of aphasia.  Able to follow 3 step commands without difficulty. Attention span and concentration seemed appropriate  Cranial Nerves: II: Discs flat bilaterally; Visual fields grossly normal, pupils equal, round, reactive to light and accommodation III,IV, VI: ptosis not present, extra-ocular motions intact bilaterally V,VII: Mild left facial droop, facial light touch sensation decreased VIII: hearing normal bilaterally IX,X: gag reflex present XI: bilateral shoulder shrug XII: midline tongue extension Motor: Right :  Upper extremity  5/5 Without pronator drift      Left: Upper extremity   5/5 without pronator drift Right:   Lower extremity   5/5                                          Left: Lower extremity   4+/5 Tone and bulk:normal tone throughout; no atrophy noted Sensory: Pinprick and light touch  decreased on the left Deep Tendon Reflexes: 2+ and symmetric throughout Plantars: Right: mute                              Left: mute Cerebellar: Finger-to-nose testing dysmetric on the left. Heel to shin testing unable to perform on the left due to leg weakness Gait: not tested due to safety  concerns  Data Reviewed  Laboratory Studies:  Basic Metabolic Panel: Recent Labs  Lab 10/20/17 1400  NA 141  K 3.4*  CL 108  CO2 24  GLUCOSE 79  BUN 10  CREATININE 0.71  CALCIUM 9.0    Liver Function Tests: Recent Labs  Lab 10/20/17 1400  AST 20  ALT 14  ALKPHOS 99  BILITOT 0.4  PROT 6.7  ALBUMIN 4.0   No results for input(s): LIPASE, AMYLASE in the last 168 hours. No results for input(s): AMMONIA in the last 168 hours.  CBC: Recent Labs  Lab 10/20/17 1400  WBC 9.6  NEUTROABS 5.5  HGB 12.6  HCT 39.3  MCV 86.2  PLT 278    Cardiac Enzymes: Recent Labs  Lab 10/20/17 1400  TROPONINI <0.03    BNP: Invalid input(s): POCBNP  CBG: Recent Labs  Lab 10/20/17 1358  GLUCAP 74    Microbiology: Results for orders placed or performed during the hospital encounter of 03/27/17  Culture, blood (routine x 2)     Status: None   Collection Time: 03/27/17 12:44 PM  Result Value Ref Range Status   Specimen Description BLOOD LAC  Final   Special Requests   Final    BOTTLES DRAWN AEROBIC AND ANAEROBIC Blood Culture adequate volume   Culture   Final    NO GROWTH 5 DAYS Performed at [facility], [address] [place], [place] [address]    Report Status 04/01/2017 FINAL  Final  Culture, blood (routine x 2)     Status: None   Collection Time: 03/27/17 12:44 PM  Result Value Ref Range Status   Specimen Description BLOOD RAC  Final   Special Requests   Final    BOTTLES DRAWN AEROBIC AND ANAEROBIC Blood Culture adequate volume   Culture   Final    NO GROWTH 5 DAYS Performed at [facility], [address] [place],  [address]    Report Status 04/01/2017 FINAL  Final    Coagulation Studies: Recent Labs    10/20/17 1400  LABPROT 11.9  INR 0.88    Urinalysis: No results for input(s): COLORURINE, LABSPEC, PHURINE, GLUCOSEU, HGBUR, BILIRUBINUR, KETONESUR, PROTEINUR, UROBILINOGEN, NITRITE, LEUKOCYTESUR in the last 168  hours.  Invalid input(s): APPERANCEUR  Lipid Panel:    Component Value Date/Time   CHOL 149 09/23/2017 0415   TRIG 177 (H) 09/23/2017 0415   HDL 41 09/23/2017 0415   CHOLHDL 3.6 09/23/2017 0415   VLDL 35 09/23/2017 0415   LDLCALC 73 09/23/2017 0415    HgbA1C:  Lab Results  Component Value Date   HGBA1C 6.4 (H) 09/23/2017    Urine Drug Screen:      Component Value Date/Time   LABOPIA NONE DETECTED 09/22/2017 1940   COCAINSCRNUR NONE DETECTED 09/22/2017 1940   COCAINSCRNUR NONE DETECTED 07/06/2017 1114   LABBENZ NONE DETECTED 09/22/2017 1940   AMPHETMU NONE DETECTED 09/22/2017 1940   THCU NONE DETECTED 09/22/2017 1940   LABBARB NONE DETECTED 09/22/2017 1940    Alcohol Level: No results for input(s): ETH in the last 168 hours.  Other results: EKG: normal EKG, normal sinus rhythm. Vent. rate 76 BPM PR interval * ms QRS duration 95 ms QT/QTc 407/458 ms P-R-T axes 60 13 35  Imaging: Ct Head Code Stroke Wo Contrast  Result Date: 10/20/2017 CLINICAL DATA:  Code stroke.  Left facial droop, slurred speech EXAM: CT HEAD WITHOUT CONTRAST TECHNIQUE: Contiguous axial images were obtained from the base of the skull through the vertex without intravenous contrast. COMPARISON:  MRI head 09/22/2017.  CT head 09/22/2017 FINDINGS: Brain: Negative for acute infarct. Negative for acute hemorrhage or mass Chronic infarct left parietal lobe. Extensive chronic microvascular ischemic change in the white matter. Small chronic infarcts in the cerebellum bilaterally. Vascular: Negative for hyperdense vessel Skull: Negative Sinuses/Orbits: Negative Other: None ASPECTS ([place] Stroke Program Early CT Score) - Ganglionic level infarction (caudate, lentiform nuclei, internal capsule, insula, M1-M3 cortex): 7 - Supraganglionic infarction (M4-M6 cortex): 3 Total score (0-10 with 10 being normal): 10 IMPRESSION: 1. No acute intracranial abnormality 2. ASPECTS is 10 3. Extensive chronic ischemic changes  stable from recent studies. 4. These results were called by telephone at the time of interpretation on 10/20/2017 at 2:00 pm to Dr. Cinda Quest, who verbally acknowledged these results. Electronically Signed   By: Franchot Gallo M.D.   On: 10/20/2017 14:01    Patient seen and examined.  Clinical course and management discussed.  Necessary edits performed.  I agree with the above.  Assessment and plan of care developed and discussed below.     Assessment: 72 y.o. female with pertinent history of stroke, seizure, hypertension, hyperlipidemia, diabetes type 2 with neuropathy, tobacco abuse, and atrial fibrillation presenting with complaints of left facial numbness, left facial droop, headache, and left hand weakness.  Etiology concerning for ischemic event.  Initial CT head reviewed and negative for acute intracranial abnormality.  She has had multiple similar presentations in the past and had extensive work-up with most recent work-up on 09/2017 including MRI brain which showed no acute intracranial abnormality other than chronic posterior left MCA territory infarct and old lacunar infarcts, US carotids bilaterally, CTA head and neck, and echocardiogram which were all unremarkable.  LDL 73, hemoglobin A1c 6.4.  Due to history of GI bleed with transfusion requirement patient not a candidate for anticoagulation.   Patient reports not eating or drinking for the past two days.  BP low.  Symptoms may represent some degree of hypoperfusion.     Stroke Risk Factors - atrial fibrillation, diabetes mellitus, family history, hyperlipidemia, hypertension and smoking  Plan: 1. Continue ASA 2. MRI  of the brain without contrast.  If diagnositic of an acute infarct would consider DUAP.  Repeat stroke work up not indicated.   3. Hydrate with IV fluids 500 cc bolus due to low blood pressure to promote cerebral perfusion  4. NPO until RN stroke swallow screen 5. Telemetry monitoring 6. Frequent neuro checks  This patient  was staffed with Dr. Magda Paganini, Doy Mince who personally evaluated patient, reviewed documentation and agreed with  assessment and plan of care as above.  Rufina Falco, DNP, FNP-BC Board certified Nurse Practitioner Neurology Department  10/20/2017, 2:44 PM   Alexis Goodell, MD Neurology [phone]  10/20/2017  3:50 PM

## 2017-10-20 NOTE — ED Notes (Signed)
Pt to MRI

## 2017-10-20 NOTE — ED Notes (Signed)
Attempted to call report.  Doroteo Bradford, RN, will call me back ASAP.

## 2017-10-20 NOTE — Code Documentation (Signed)
Pt arrives with complaints of left hand weakness, headache, and left facial droop, code stroke activated in triage, Dr. Cinda Quest cleared pt for CT at 1348, after non con head CT pt taken to room 19, initial NIHSS 3 for sensory, left sided ataxia, and left facial droop, no tPA given, report off to [employer]

## 2017-10-20 NOTE — ED Notes (Signed)
Pt briefly evaluated in hallway by Dr. Cinda Quest.  Noted left facial drooping, left eyelid droop, slurred speech.  Pt to CT Scan.

## 2017-10-20 NOTE — H&P (Signed)
[facility] NAME: Staphanie Aguirre    MR#:  [id number]  DATE OF BIRTH:  Sep 30, 1945  DATE OF ADMISSION:  10/20/2017  PRIMARY CARE PHYSICIAN: Tracie Harrier, MD   REQUESTING/REFERRING PHYSICIAN: Dr. Conni Slipper  CHIEF COMPLAINT: Left-sided weakness   Chief Complaint  Patient presents with  . Code Stroke    HISTORY OF PRESENT ILLNESS:  Samantha Aguirre  is a 72 y.o. female with a known history of COPD, CAD, diabetes mellitus type 2, frequent headaches, hyperlipidemia, hypertension, seizures, GERD comes in with left facial droop, left arm weakness, left hand weakness since 11:30 AM Did not have any slurred speech.  Denies double vision.  She was on Eliquis for A. fib before but stopped due to history of GI bleed.  Patient takes only aspirin. PAST MEDICAL HISTORY:   Past Medical History:  Diagnosis Date  . Acid reflux   . Arthritis    Bilateral knees, hands, feet  . Asthma   . Atrial fibrillation ([place])   . Cancer ([place])   . CHF (congestive heart failure) (HCC)    Diastolic CHF  . COPD (chronic obstructive pulmonary disease) ([place])   . Coronary artery disease   . Diabetes mellitus without complication ([place])   . Frequent headaches   . GI bleed   . HLD (hyperlipidemia)   . Hypertension   . Seizures ([place])   . Skin cancer 2015   Suspected basal cell carcinoma on nose, surgically removed  . Stroke ([place]) 04/2017  . Thyroid disease   . Tremors of nervous system     PAST SURGICAL HISTOIRY:   Past Surgical History:  Procedure Laterality Date  . ABDOMINAL HYSTERECTOMY  1976  . APPENDECTOMY  1980  . CAROTID ENDARTERECTOMY    . CHOLECYSTECTOMY  1980  . COLONOSCOPY N/A 02/20/2017   Procedure: COLONOSCOPY;  Surgeon: Lin Landsman, MD;  Location: [facility] ENDOSCOPY;  Service: Gastroenterology;  Laterality: N/A;  . ESOPHAGOGASTRODUODENOSCOPY N/A 02/20/2017   Procedure: ESOPHAGOGASTRODUODENOSCOPY (EGD);  Surgeon: Lin Landsman, MD;  Location: [facility] ENDOSCOPY;  Service: Gastroenterology;  Laterality: N/A;  . ESOPHAGOGASTRODUODENOSCOPY (EGD) WITH PROPOFOL N/A 07/08/2017   Procedure: ESOPHAGOGASTRODUODENOSCOPY (EGD) WITH PROPOFOL;  Surgeon: [name], MD;  Location: [facility] ENDOSCOPY;  Service: Endoscopy;  Laterality: N/A;  . [place]  . GIVENS CAPSULE STUDY N/A 07/09/2017   Procedure: GIVENS CAPSULE STUDY;  Surgeon: Jonathon Bellows, MD;  Location: [facility] ENDOSCOPY;  Service: Gastroenterology;  Laterality: N/A;  . KNEE SURGERY     left  . ROTATOR CUFF REPAIR     right  . TONSILLECTOMY  1950    SOCIAL HISTORY:   Social History   Tobacco Use  . Smoking status: Current Every Day Smoker    Packs/day: 1.00    Years: 53.00    Pack years: 53.00    Types: Cigarettes  . Smokeless tobacco: Never Used  Substance Use Topics  . Alcohol use: No    FAMILY HISTORY:   Family History  Problem Relation Age of Onset  . Breast cancer Mother   . Heart disease Mother   . Stroke Mother   . Cancer Mother   . COPD Mother   . Diabetes Mother   . Heart disease Father   . Diabetes Father   . Stroke Father   . Alcohol abuse Sister   . Drug abuse Sister   . Stroke Sister   . Cancer Sister   . Mental illness Sister   .  Heart disease Brother   . Arthritis Brother   . Diabetes Brother   . Heart disease Maternal Grandfather   . Heart disease Paternal Grandfather     DRUG ALLERGIES:   Allergies  Allergen Reactions  . Methotrexate Rash  . Mushroom Extract Complex Anaphylaxis    Made patient "deathly sick"  . Atenolol     "MD took me off of it bc it was doing something wrong" Made patient HYPOTENSIVE  . Ivp Dye [Iodinated Diagnostic Agents] Itching    Pt injected with IV contrast.  5 min after injection pt complained of itching behind ear and on her abd.  . Morphine And Related Other (See Comments)    "stops my breathing" NEAR-RESPIRATORY ARREST  . Oxycodone Hcl   . Percocet  [Oxycodone-Acetaminophen] Nausea And Vomiting and Other (See Comments)    Stomach pains  . Percodan [Oxycodone-Aspirin] Nausea And Vomiting and Other (See Comments)    Stomach pains  . Tramadol Nausea Only  . Verapamil Other (See Comments)    " MD told me this was screwing me up" MAKES PATIENT HYPOTENSIVE    REVIEW OF SYSTEMS:  CONSTITUTIONAL: No fever, fatigue or weakness.  EYES: No blurred or double vision.  EARS, NOSE, AND THROAT: No tinnitus or ear pain.  RESPIRATORY: No cough, shortness of breath, wheezing or hemoptysis.  CARDIOVASCULAR: No chest pain, orthopnea, edema.  GASTROINTESTINAL: No nausea, vomiting, diarrhea or abdominal pain.  GENITOURINARY: No dysuria, hematuria.  ENDOCRINE: No polyuria, nocturia,  HEMATOLOGY: No anemia, easy bruising or bleeding SKIN: No rash or lesion. MUSCULOSKELETAL: No joint pain or arthritis.   NEUROLOGIC: Left-sided weakness, weakness.  PSYCHIATRY: No anxiety or depression.   MEDICATIONS AT HOME:   Prior to Admission medications   Medication Sig Start Date End Date Taking? Authorizing Provider  albuterol (PROVENTIL HFA;VENTOLIN HFA) 108 (90 Base) MCG/ACT inhaler Inhale 2 puffs into the lungs every 6 (six) hours as needed for wheezing or shortness of breath. 10/08/16  Yes Karamalegos, Devonne Doughty, DO  atorvastatin (LIPITOR) 80 MG tablet Take 1 tablet (80 mg total) by mouth daily at 6 PM. 09/24/16  Yes Karamalegos, Devonne Doughty, DO  colestipol (COLESTID) 1 g tablet Take 2 g by mouth 2 (two) times daily. 10/13/17  Yes [provider]  DULoxetine (CYMBALTA) 60 MG capsule Take 1 capsule (60 mg total) by mouth at bedtime. 09/24/16  Yes Karamalegos, Devonne Doughty, DO  ferrous sulfate 325 (65 FE) MG EC tablet Take 1 tablet (325 mg total) by mouth 2 (two) times daily with a meal. Patient taking differently: Take 325 mg by mouth daily.  02/21/17  Yes Sudini, Alveta Heimlich, MD  fluticasone (FLONASE) 50 MCG/ACT nasal spray Place 1 spray into both nostrils  daily. 10/08/16  Yes Karamalegos, Devonne Doughty, DO  furosemide (LASIX) 20 MG tablet Take 20 mg by mouth daily. 10/13/17  Yes [provider]  gabapentin (NEURONTIN) 800 MG tablet Take 1 tablet (800 mg total) by mouth 2 (two) times daily. 09/24/16  Yes Karamalegos, Devonne Doughty, DO  glipiZIDE-metformin (METAGLIP) 5-500 MG tablet Take 1 tablet by mouth 2 (two) times daily before a meal. Patient taking differently: Take 1-2 tablets by mouth See admin instructions. Take 2 tablets by mouth every morning and 1 tablet by mouth every night 04/02/17  Yes Karamalegos, Devonne Doughty, DO  levETIRAcetam (KEPPRA) 500 MG tablet Take 1 tablet (500 mg total) by mouth 2 (two) times daily. 09/19/15  Yes Epifanio Lesches, MD  levothyroxine (SYNTHROID, LEVOTHROID) 112 MCG tablet Take 1  tablet (112 mcg total) by mouth daily before breakfast. 09/24/17  Yes Vann, Jessica U, DO  lisinopril (PRINIVIL,ZESTRIL) 10 MG tablet Take 10 mg by mouth daily. 10/13/17  Yes [provider]  nitroGLYCERIN (NITROSTAT) 0.4 MG SL tablet Place 1 tablet (0.4 mg total) under the tongue every 5 (five) minutes as needed for chest pain. 02/28/16  Yes Mody, Sital, MD  omeprazole (PRILOSEC) 20 MG capsule Take 20 mg by mouth 2 (two) times daily. 09/02/17  Yes [provider]  potassium chloride SA (K-DUR,KLOR-CON) 20 MEQ tablet Take 20 mEq by mouth 2 (two) times daily.   Yes [provider]  sucralfate (CARAFATE) 1 g tablet TAKE (1) TABLET BY MOUTH FOUR TIMES A DAY Patient taking differently: Take 1 g by mouth 4 (four) times daily.  03/26/17  Yes Karamalegos, Alexander J, DO  topiramate (TOPAMAX) 25 MG tablet Take 25 mg by mouth 2 (two) times daily. 10/13/17  Yes [provider]  vitamin B-12 (CYANOCOBALAMIN) 1000 MCG tablet Take 1 tablet by mouth daily. 06/29/17  Yes [provider]  aspirin 325 MG tablet Take 1 tablet (325 mg total) by mouth daily. Patient not taking: Reported on 10/20/2017 08/03/17    Regalado, Belkys A, MD  blood glucose meter kit and supplies Dispense based on patient and insurance preference. Use up to four times daily as directed. (FOR ICD-10 E10.9, E11.9). Patient not taking: Reported on 09/28/2017 08/03/17   Regalado, Belkys A, MD  diclofenac sodium (VOLTAREN) 1 % GEL Apply 2 g topically 4 (four) times daily. Patient not taking: Reported on 10/20/2017 01/11/16   Quigley, Brian S, MD  feeding supplement, GLUCERNA SHAKE, (GLUCERNA SHAKE) LIQD Take 237 mLs by mouth 2 (two) times daily between meals. Patient not taking: Reported on 09/28/2017 08/03/17   Regalado, Belkys A, MD  pantoprazole (PROTONIX) 40 MG tablet Take 1 tablet (40 mg total) by mouth 2 (two) times daily. Patient not taking: Reported on 09/28/2017 08/03/17   Regalado, Belkys A, MD  umeclidinium bromide (INCRUSE ELLIPTA) 62.5 MCG/INH AEPB Inhale 1 puff into the lungs daily. Patient not taking: Reported on 10/20/2017 11/13/15   Karamalegos, Alexander J, DO      VITAL SIGNS:  Blood pressure 101/68, pulse 65, temperature 98.4 F (36.9 C), resp. rate (!) 22, SpO2 96 %.  PHYSICAL EXAMINATION:  GENERAL:  72 y.o.-year-old patient lying in the bed with no acute distress.  Patient speech is fluent.  Able to answer my questions appropriately even though drifting back to sleep. EYES: Pupils equal, round, reactive to light and accommodation. No scleral icterus. Extraocular muscles intact.  HEENT: Head atraumatic, normocephalic. Oropharynx and nasopharynx clear.  NECK:  Supple, no jugular venous distention. No thyroid enlargement, no tenderness.  LUNGS: Normal breath sounds bilaterally, no wheezing, rales,rhonchi or crepitation. No use of accessory muscles of respiration.  CARDIOVASCULAR: S1, S2 normal. No murmurs, rubs, or gallops.  ABDOMEN: Soft, nontender, nondistended. Bowel sounds present. No organomegaly or mass.  EXTREMITIES: No pedal edema, cyanosis, or clubbing.  NEUROLOGIC: Cranial nerves II through XII are intact.  Muscle strength 5/5 in the right side, left upper and lower extremity 3 out of 5 left upper and lower extremity.  Patient muscle tone is normal and no atrophy noted.  DTR 2+ bilaterally.   Extremities.  Decreased sensation to pinprick and light touch on the left side. .  PSYCHIATRIC: The patient is alert and oriented x 3.  SKIN: No obvious rash, lesion, or ulcer.   LABORATORY PANEL:     CBC Recent Labs  Lab 10/20/17 1400  WBC 9.6  HGB 12.6  HCT 39.3  PLT 278   ------------------------------------------------------------------------------------------------------------------  Chemistries  Recent Labs  Lab 10/20/17 1400  NA 141  K 3.4*  CL 108  CO2 24  GLUCOSE 79  BUN 10  CREATININE 0.71  CALCIUM 9.0  AST 20  ALT 14  ALKPHOS 99  BILITOT 0.4   ------------------------------------------------------------------------------------------------------------------  Cardiac Enzymes Recent Labs  Lab 10/20/17 1400  TROPONINI <0.03   ------------------------------------------------------------------------------------------------------------------  RADIOLOGY:  [name]  Result Date: 10/20/2017 CLINICAL DATA:  Stroke EXAM: MRI HEAD WITHOUT CONTRAST TECHNIQUE: Multiplanar, multiecho pulse sequences of the brain and surrounding structures were obtained without intravenous contrast. COMPARISON:  CT head 10/20/2017, MRI head 09/22/2017 FINDINGS: Brain: Negative for acute infarct. Multiple areas of chronic ischemia. Chronic infarct left parietal lobe with mild hemorrhage. Chronic microvascular ischemic change throughout the white matter. Chronic infarcts in the cerebellum bilaterally. 1 cm right parietal parasagittal mass compatible with meningioma is unchanged. Vascular: Normal arterial flow voids Skull and upper cervical spine: Negative Sinuses/Orbits: Negative Other: None IMPRESSION: Negative for acute infarct. No change from prior studies. Extensive chronic ischemic changes.  Electronically Signed   By: Franchot Gallo M.D.   On: 10/20/2017 16:22   Ct Head Code Stroke Wo Contrast  Result Date: 10/20/2017 CLINICAL DATA:  Code stroke.  Left facial droop, slurred speech EXAM: CT HEAD WITHOUT CONTRAST TECHNIQUE: Contiguous axial images were obtained from the base of the skull through the vertex without intravenous contrast. COMPARISON:  MRI head 09/22/2017.  CT head 09/22/2017 FINDINGS: Brain: Negative for acute infarct. Negative for acute hemorrhage or mass Chronic infarct left parietal lobe. Extensive chronic microvascular ischemic change in the white matter. Small chronic infarcts in the cerebellum bilaterally. Vascular: Negative for hyperdense vessel Skull: Negative Sinuses/Orbits: Negative Other: None ASPECTS ([place] Stroke Program Early CT Score) - Ganglionic level infarction (caudate, lentiform nuclei, internal capsule, insula, M1-M3 cortex): 7 - Supraganglionic infarction (M4-M6 cortex): 3 Total score (0-10 with 10 being normal): 10 IMPRESSION: 1. No acute intracranial abnormality 2. ASPECTS is 10 3. Extensive chronic ischemic changes stable from recent studies. 4. These results were called by telephone at the time of interpretation on 10/20/2017 at 2:00 pm to Dr. Cinda Quest, who verbally acknowledged these results. Electronically Signed   By: Franchot Gallo M.D.   On: 10/20/2017 14:01    EKG:   Orders placed or performed during the hospital encounter of 10/20/17  . ED EKG  . ED EKG  . EKG 12-Lead  . EKG 12-Lead  EKG showed normal sinus rhythm at 76 bpm with normal PR interval.  IMPRESSION AND PLAN:  72 year old female patient with history of seizures, previous stroke, hypertension, hyperlipidemia, diabetes mellitus type 2, chronic A. fib not on anticoagulation due to history of GI bleed comes in with left facial droop, left-sided weakness. 1.  Left-sided weakness concerning for acute ischemic event, CT head is negative for acute event, had history of chronic left  parietal infarct patient had extensive work-up including most recent MRI brain September showed no acute abnormality other than chronic left MCA infarct.  Anyway patient is to be admitted for evaluation of new stroke, check MRI of the brain, check official's stroke swallow screen, continue n.p.o. until then, monitor on telemetry, check echocardiogram with bubble study, frequent neuro checks, PT and OT consult. CT angios head and neck showed negative for large vessel occlusion, has atherosclerotic carotid disease and moderate vertebral stenosis and has left  cavernous sinus enhancing mass not changed from before. 2.  Dehydration with hypotension episodes, continue IV fluids, check UA #3 history of COPD: Continue bronchodilators 4.  History of severe dementia: Continue Keppra IV. Hold other p.o. medicines, resume when patient is able to swallow. All the records are reviewed and case discussed with ED provider. Management plans discussed with the patient, family and they are in agreement.  CODE STATUS: DNR  TOTAL TIME TAKING CARE OF THIS PATIENT: 55 minutes.    Epifanio Lesches M.D on 10/20/2017 at 5:11 PM  Between 7am to 6pm - Pager - [phone]  After 6pm go to [email or web address] - password [email or web address] [place] [facility]  Office  [phone]  CC: Primary care physician; Tracie Harrier, MD  Note: This dictation was prepared with Dragon dictation along with smaller phrase technology. Any transcriptional errors that result from this process are unintentional.

## 2017-10-20 NOTE — ED Notes (Signed)
Report given to Sonjia, RN 

## 2017-10-20 NOTE — ED Notes (Signed)
CODE STROKE CALLED TO [phone] 

## 2017-10-21 ENCOUNTER — Observation Stay (HOSPITAL_BASED_OUTPATIENT_CLINIC_OR_DEPARTMENT_OTHER)
Admit: 2017-10-21 | Discharge: 2017-10-21 | Disposition: A | Discharge status: Home / Self Care | Payer: Medicare Other | Attending: Internal Medicine | Admitting: Internal Medicine

## 2017-10-21 DIAGNOSIS — G40909 Epilepsy, unspecified, not intractable, without status epilepticus: Secondary | ICD-10-CM | POA: Diagnosis not present

## 2017-10-21 DIAGNOSIS — G459 Transient cerebral ischemic attack, unspecified: Secondary | ICD-10-CM | POA: Diagnosis not present

## 2017-10-21 DIAGNOSIS — R531 Weakness: Secondary | ICD-10-CM | POA: Diagnosis not present

## 2017-10-21 DIAGNOSIS — I340 Nonrheumatic mitral (valve) insufficiency: Secondary | ICD-10-CM

## 2017-10-21 DIAGNOSIS — E039 Hypothyroidism, unspecified: Secondary | ICD-10-CM | POA: Diagnosis not present

## 2017-10-21 DIAGNOSIS — I959 Hypotension, unspecified: Secondary | ICD-10-CM | POA: Diagnosis not present

## 2017-10-21 LAB — LIPID PANEL
CHOLESTEROL: 142 mg/dL (ref 0–200)
HDL: 41 mg/dL (ref >40)
LDL Cholesterol: 58 mg/dL (ref 0–99)
TRIGLYCERIDES: 216 mg/dL — AB (ref <150)
Total CHOL/HDL Ratio: 3.5 RATIO
VLDL: 43 mg/dL — ABNORMAL HIGH (ref 0–40)

## 2017-10-21 LAB — ECHOCARDIOGRAM COMPLETE
Height: 61 in
Weight: 2784 oz

## 2017-10-21 LAB — HEMOGLOBIN A1C
Hgb A1c MFr Bld: 6.5 % — ABNORMAL HIGH (ref 4.8–5.6)
MEAN PLASMA GLUCOSE: 139.85 mg/dL

## 2017-10-21 LAB — GLUCOSE, CAPILLARY
GLUCOSE-CAPILLARY: 108 mg/dL — AB (ref 70–99)
GLUCOSE-CAPILLARY: 142 mg/dL — AB (ref 70–99)

## 2017-10-21 MED ORDER — ACETAMINOPHEN 325 MG PO TABS
650.0000 mg | ORAL_TABLET | Freq: Four times a day (QID) | ORAL | Status: DC | PRN
  Administered 2017-10-21: 650 mg via ORAL
  Filled 2017-10-21: qty 2

## 2017-10-21 NOTE — Evaluation (Signed)
Physical Therapy Evaluation Patient Details Name: Samantha Aguirre MRN: [id number] DOB: 31-Aug-1945 Today's Date: 10/21/2017   History of Present Illness  Pt is a 72 y/o female admitted 10/20/17 with L side weakness. PMHx significant for CVA (04/2017) with recent TPA, afib not on anticoagulation d/t recent GI bleed, DM type 2, seizures, hyperlipidemia, arthritis, CHF, tremors.  MRI negative for acute abnormailty, chronic posterior L MCA territory, remote R thalamic lacunar, and B cerebellar infarcts.     Clinical Impression  Pt admitted with above diagnosis. Pt currently with functional limitations due to the deficits listed below (see PT Problem List). Ms. Yung appears to be close to her baseline level of mobility and strength.  She demonstrates weakness in LLE which pt reports is her baseline.  Pt at supervision level for bed mobility, transfers, and ambulation in room. Pt will benefit from skilled PT to increase their independence and safety with mobility to allow discharge to the venue listed below.      Follow Up Recommendations Home health PT    Equipment Recommendations  None recommended by PT    Recommendations for Other Services       Precautions / Restrictions Precautions Precautions: Fall;Other (comment) Precaution Comments: +orthostatics on eval Restrictions Weight Bearing Restrictions: No      Mobility  Bed Mobility Overal bed mobility: Needs Assistance Bed Mobility: Sit to Supine;Supine to Sit     Supine to sit: Supervision;HOB elevated Sit to supine: Supervision   General bed mobility comments: Increased time and effort but no physical assist or cues needed  Transfers Overall transfer level: Needs assistance Equipment used: Rolling walker (2 wheeled) Transfers: Sit to/from Stand Sit to Stand: Supervision         General transfer comment: Pt with proper hand placement for safe technique.  Pt slower to rise but remains steady.    Ambulation/Gait Ambulation/Gait assistance: Supervision Gait Distance (Feet): 110 Feet Assistive device: Rolling walker (2 wheeled) Gait Pattern/deviations: Step-through pattern;Decreased stride length;Decreased weight shift to left     General Gait Details: Pt with dec weight shift to LLE due to pain with ambulation (pt's baseline).  Pt pushes RW too far ahead of her.    Stairs            Wheelchair Mobility    Modified Rankin (Stroke Patients Only)       Balance Overall balance assessment: Needs assistance Sitting-balance support: No upper extremity supported;Feet supported Sitting balance-Leahy Scale: Good     Standing balance support: Bilateral upper extremity supported;During functional activity Standing balance-Leahy Scale: Poor Standing balance comment: Relies on BUE support from RW for static and dynamic activities                             Pertinent Vitals/Pain Pain Assessment: 0-10 Pain Score: 8  Pain Location: headache, chronic (baseline)(6/10 chronic pain in LLE with MMT and ambulation, baseline) Pain Descriptors / Indicators: Headache;Shooting Pain Intervention(s): Limited activity within patient's tolerance;Monitored during session    Home Living Family/patient expects to be discharged to:: Private residence Living Arrangements: Alone Available Help at Discharge: Family;Friend(s);Available PRN/intermittently Type of Home: Apartment Home Access: Level entry     Home Layout: One level Home Equipment: Walker - 2 wheels;Shower seat;Adaptive equipment;Cane - single point;Grab bars - tub/shower Additional Comments: pt reports getting a power wheelchair soon    Prior Function Level of Independence: Independent with assistive device(s)         Comments: Pt  indep with ADL, fearful of falls, has someone in home when she showers in case she needs help, ambulating with RW, was getting HHOT and HHPT and HHRN at home.  Pt reports 2 falls in  the past 6 months.      Hand Dominance   Dominant Hand: Right    Extremity/Trunk Assessment   Upper Extremity Assessment Upper Extremity Assessment: Defer to OT evaluation LUE Deficits / Details: grossly 4-5, impaired sensation, no pronator drift noted, pt endorses occasional tremors in B hands LUE Sensation: decreased light touch    Lower Extremity Assessment Lower Extremity Assessment: LLE deficits/detail LLE Deficits / Details: Strength grossly 3-/5; however, question pt's effort due to feel with easy light push, inconsistent resistance throughout.  Dec sensation from distal L thigh to L foot, pt reports this as her baseline since last stroke.   LLE Sensation: decreased light touch    Cervical / Trunk Assessment Cervical / Trunk Assessment: Normal  Communication   Communication: Expressive difficulties(word finding difficulties)  Cognition Arousal/Alertness: Awake/alert Behavior During Therapy: WFL for tasks assessed/performed Overall Cognitive Status: Within Functional Limits for tasks assessed                                        General Comments General comments (skin integrity, edema, etc.): HR in low 50s at rest and increased to low 70s with activity. 148/80 supine in bed, 123/85 in sitting, 126/87 in standing, 140/56 in supine at end of session. RN made aware of positive orthostatics from supine to sit.     Exercises Other Exercises Other Exercises: functional transfer training EOB requiring cues for technique to improve safety/indep and ultimately min assist for transfers and CGA for side scooting along EOB   Assessment/Plan    PT Assessment Patient needs continued PT services  PT Problem List Decreased strength;Decreased activity tolerance;Decreased balance;Decreased knowledge of use of DME;Decreased safety awareness       PT Treatment Interventions DME instruction;Gait training;Functional mobility training;Therapeutic activities;Therapeutic  exercise;Balance training;Patient/family education;Neuromuscular re-education    PT Goals (Current goals can be found in the Care Plan section)  Acute Rehab PT Goals Patient Stated Goal: to go home and return to PLOF PT Goal Formulation: With patient Time For Goal Achievement: 11/04/17 Potential to Achieve Goals: Good    Frequency Min 2X/week   Barriers to discharge        Co-evaluation               AM-PAC PT "6 Clicks" Daily Activity  Outcome Measure Difficulty turning over in bed (including adjusting bedclothes, sheets and blankets)?: A Little Difficulty moving from lying on back to sitting on the side of the bed? : A Little Difficulty sitting down on and standing up from a chair with arms (e.g., wheelchair, bedside commode, etc,.)?: A Little Help needed moving to and from a bed to chair (including a wheelchair)?: A Little Help needed walking in hospital room?: A Little Help needed climbing 3-5 steps with a railing? : A Lot 6 Click Score: 17    End of Session Equipment Utilized During Treatment: Gait belt Activity Tolerance: Patient tolerated treatment well Patient left: in bed;with call bell/phone within reach;with bed alarm set Nurse Communication: Mobility status;Other (comment)(orthostatics) PT Visit Diagnosis: Muscle weakness (generalized) (M62.81);Unsteadiness on feet (R26.81);Other abnormalities of gait and mobility (R26.89);History of falling (Z91.81)    Time: 6387-5643 PT Time Calculation (min) (ACUTE ONLY):  27 min   Charges:   PT Evaluation $PT Eval Low Complexity: 1 Low PT Treatments $Gait Training: 8-22 mins       Collie Siad PT, DPT 10/21/2017, 11:45 AM

## 2017-10-21 NOTE — Care Management Note (Deleted)
Case Management Note  Patient Details  Name: Samantha Aguirre MRN: [id number] Date of Birth: [birth year]  Subjective/Objective:    Patient from [facility] assisted [facility] with TIA.  Has used [facility] in the past.  Current with PCP and denies difficulty affording medications or with transportation.  On room air and declines any RN or PT home health services at this time.  No needs identified at this time.              Action/Plan: Plan to discharge today.      Expected Discharge Date:  10/21/17               Expected Discharge Plan:  Assisted Living / Rest Home  In-House Referral:     Discharge planning Services  CM Consult  Post Acute Care Choice:  Resumption of Svcs/PTA Provider Choice offered to:     DME Arranged:    DME Agency:     HH Arranged:  OT, PT HH Agency:  [facility]  Status of Service:  Completed, signed off  If discussed at [place] of Stay Meetings, dates discussed:    Additional Comments:  Elza Rafter, RN 10/21/2017, 11:51 AM

## 2017-10-21 NOTE — Discharge Summary (Signed)
[facility] NAME: Samantha Aguirre    MR#:  [id number]  DATE OF BIRTH:  10-07-1945  DATE OF ADMISSION:  10/20/2017   ADMITTING PHYSICIAN: Epifanio Lesches, MD  DATE OF DISCHARGE: 10/21/2017 PRIMARY CARE PHYSICIAN: Tracie Harrier, MD   ADMISSION DIAGNOSIS:  TIA (transient ischemic attack) [G45.9] DISCHARGE DIAGNOSIS:  Active Problems:   TIA (transient ischemic attack)  SECONDARY DIAGNOSIS:   Past Medical History:  Diagnosis Date  . Acid reflux   . Arthritis    Bilateral knees, hands, feet  . Asthma   . Atrial fibrillation ([place])   . Cancer ([place])   . CHF (congestive heart failure) ([facility])    Diastolic CHF  . COPD (chronic obstructive pulmonary disease) ([place])   . Coronary artery disease   . Diabetes mellitus without complication ([place])   . Frequent headaches   . GI bleed   . HLD (hyperlipidemia)   . Hypertension   . Seizures ([place])   . Skin cancer 2015   Suspected basal cell carcinoma on nose, surgically removed  . Stroke ([place]) 04/2017  . Thyroid disease   . Tremors of nervous system    HOSPITAL COURSE:  72 year old female patient with history of seizures, previous stroke, hypertension, hyperlipidemia, diabetes mellitus type 2, chronic A. fib not on anticoagulation due to history of GI bleed comes in with left facial droop, left-sided weakness. 1.  Left-sided weakness concerning for acute ischemic event, CT head is negative for acute event, had history of chronic left parietal infarct patient had extensive work-up including most recent MRI brain September showed no acute abnormality other than chronic left MCA infarct.  No acute CVA per MRI of the brain, echo is unremarkable.   CT angios head and neck showed negative for large vessel occlusion, has atherosclerotic carotid disease and moderate vertebral stenosis and has left cavernous sinus enhancing mass not changed from before. Continue aspirin per neurology  consult. 2.  Dehydration with hypotension episodes, she was given IV fluids  #3 history of COPD: Continue bronchodilators 4.  History of severe dementia: Continue Keppra. Orthostatic hypotension.  Hold lisinopril and Lasix.  Follow-up PCP to resume. Tobacco abuse.  Smoking cessation was consulted for 3 to 4 minutes. DISCHARGE CONDITIONS:  Stable, discharge to home and resume home health and PT. CONSULTS OBTAINED:  Treatment Team:  Catarina Hartshorn, MD DRUG ALLERGIES:   Allergies  Allergen Reactions  . Methotrexate Rash  . Mushroom Extract Complex Anaphylaxis    Made patient "deathly sick"  . Atenolol     "MD took me off of it bc it was doing something wrong" Made patient HYPOTENSIVE  . Ivp Dye [Iodinated Diagnostic Agents] Itching    Pt injected with IV contrast.  5 min after injection pt complained of itching behind ear and on her abd.  . Morphine And Related Other (See Comments)    "stops my breathing" NEAR-RESPIRATORY ARREST  . Oxycodone Hcl   . Percocet [Oxycodone-Acetaminophen] Nausea And Vomiting and Other (See Comments)    Stomach pains  . Percodan [Oxycodone-Aspirin] Nausea And Vomiting and Other (See Comments)    Stomach pains  . Tramadol Nausea Only  . Verapamil Other (See Comments)    " MD told me this was screwing me up" MAKES PATIENT HYPOTENSIVE   DISCHARGE MEDICATIONS:   Allergies as of 10/21/2017      Reactions   Methotrexate Rash   Mushroom Extract Complex Anaphylaxis   Made patient "deathly sick"  Atenolol    "MD took me off of it bc it was doing something wrong" Made patient HYPOTENSIVE   Ivp Dye [iodinated Diagnostic Agents] Itching   Pt injected with IV contrast.  5 min after injection pt complained of itching behind ear and on her abd.   Morphine And Related Other (See Comments)   "stops my breathing" NEAR-RESPIRATORY ARREST   Oxycodone Hcl    Percocet [oxycodone-acetaminophen] Nausea And Vomiting, Other (See Comments)   Stomach pains    Percodan [oxycodone-aspirin] Nausea And Vomiting, Other (See Comments)   Stomach pains   Tramadol Nausea Only   Verapamil Other (See Comments)   " MD told me this was screwing me up" MAKES PATIENT HYPOTENSIVE      Medication List    STOP taking these medications   furosemide 20 MG tablet Commonly known as:  LASIX   lisinopril 10 MG tablet Commonly known as:  PRINIVIL,ZESTRIL     TAKE these medications   albuterol 108 (90 Base) MCG/ACT inhaler Commonly known as:  PROVENTIL HFA;VENTOLIN HFA Inhale 2 puffs into the lungs every 6 (six) hours as needed for wheezing or shortness of breath.   aspirin 325 MG tablet Take 1 tablet (325 mg total) by mouth daily.   atorvastatin 80 MG tablet Commonly known as:  LIPITOR Take 1 tablet (80 mg total) by mouth daily at 6 PM.   blood glucose meter kit and supplies Dispense based on patient and insurance preference. Use up to four times daily as directed. (FOR ICD-10 E10.9, E11.9).   colestipol 1 g tablet Commonly known as:  COLESTID Take 2 g by mouth 2 (two) times daily.   diclofenac sodium 1 % Gel Commonly known as:  VOLTAREN Apply 2 g topically 4 (four) times daily.   DULoxetine 60 MG capsule Commonly known as:  CYMBALTA Take 1 capsule (60 mg total) by mouth at bedtime.   feeding supplement (GLUCERNA SHAKE) Liqd Take 237 mLs by mouth 2 (two) times daily between meals.   ferrous sulfate 325 (65 FE) MG EC tablet Take 1 tablet (325 mg total) by mouth 2 (two) times daily with a meal. What changed:  when to take this   fluticasone 50 MCG/ACT nasal spray Commonly known as:  FLONASE Place 1 spray into both nostrils daily.   gabapentin 800 MG tablet Commonly known as:  NEURONTIN Take 1 tablet (800 mg total) by mouth 2 (two) times daily.   glipiZIDE-metformin 5-500 MG tablet Commonly known as:  METAGLIP Take 1 tablet by mouth 2 (two) times daily before a meal. What changed:    how much to take  when to take this  additional  instructions   levETIRAcetam 500 MG tablet Commonly known as:  KEPPRA Take 1 tablet (500 mg total) by mouth 2 (two) times daily.   levothyroxine 112 MCG tablet Commonly known as:  SYNTHROID, LEVOTHROID Take 1 tablet (112 mcg total) by mouth daily before breakfast.   nitroGLYCERIN 0.4 MG SL tablet Commonly known as:  NITROSTAT Place 1 tablet (0.4 mg total) under the tongue every 5 (five) minutes as needed for chest pain.   omeprazole 20 MG capsule Commonly known as:  PRILOSEC Take 20 mg by mouth 2 (two) times daily.   pantoprazole 40 MG tablet Commonly known as:  PROTONIX Take 1 tablet (40 mg total) by mouth 2 (two) times daily.   potassium chloride SA 20 MEQ tablet Commonly known as:  K-DUR,KLOR-CON Take 20 mEq by mouth 2 (two) times daily.  sucralfate 1 g tablet Commonly known as:  CARAFATE TAKE (1) TABLET BY MOUTH FOUR TIMES A DAY What changed:  See the new instructions.   topiramate 25 MG tablet Commonly known as:  TOPAMAX Take 25 mg by mouth 2 (two) times daily.   umeclidinium bromide 62.5 MCG/INH Aepb Commonly known as:  INCRUSE ELLIPTA Inhale 1 puff into the lungs daily.   vitamin B-12 1000 MCG tablet Commonly known as:  CYANOCOBALAMIN Take 1 tablet by mouth daily.        DISCHARGE INSTRUCTIONS:  See AVS.  If you experience worsening of your admission symptoms, develop shortness of breath, life threatening emergency, suicidal or homicidal thoughts you must seek medical attention immediately by calling 911 or calling your MD immediately  if symptoms less severe.  You Must read complete instructions/literature along with all the possible adverse reactions/side effects for all the Medicines you take and that have been prescribed to you. Take any new Medicines after you have completely understood and accpet all the possible adverse reactions/side effects.   Please note  You were cared for by a hospitalist during your hospital stay. If you have any questions  about your discharge medications or the care you received while you were in the hospital after you are discharged, you can call the unit and asked to speak with the hospitalist on call if the hospitalist that took care of you is not available. Once you are discharged, your primary care physician will handle any further medical issues. Please note that NO REFILLS for any discharge medications will be authorized once you are discharged, as it is imperative that you return to your primary care physician (or establish a relationship with a primary care physician if you do not have one) for your aftercare needs so that they can reassess your need for medications and monitor your lab values.    On the day of Discharge:  VITAL SIGNS:  Blood pressure (!) 146/49, pulse (!) 52, temperature 97.8 F (36.6 C), temperature source Oral, resp. rate (!) 25, height '5\' 1"'  (1.549 m), weight 78.9 kg, SpO2 95 %. PHYSICAL EXAMINATION:  GENERAL:  72 y.o.-year-old patient lying in the bed with no acute distress.  EYES: Pupils equal, round, reactive to light and accommodation. No scleral icterus. Extraocular muscles intact.  HEENT: Head atraumatic, normocephalic. Oropharynx and nasopharynx clear.  NECK:  Supple, no jugular venous distention. No thyroid enlargement, no tenderness.  LUNGS: Normal breath sounds bilaterally, no wheezing, rales,rhonchi or crepitation. No use of accessory muscles of respiration.  CARDIOVASCULAR: S1, S2 normal. No murmurs, rubs, or gallops.  ABDOMEN: Soft, non-tender, non-distended. Bowel sounds present. No organomegaly or mass.  EXTREMITIES: No pedal edema, cyanosis, or clubbing.  NEUROLOGIC: Cranial nerves II through XII are intact. Muscle strength 5/5 in all extremities. Sensation intact. Gait not checked.  PSYCHIATRIC: The patient is alert and oriented x 3.  SKIN: No obvious rash, lesion, or ulcer.  DATA REVIEW:   CBC Recent Labs  Lab 10/20/17 1400  WBC 9.6  HGB 12.6  HCT 39.3  PLT  278    Chemistries  Recent Labs  Lab 10/20/17 1400  NA 141  K 3.4*  CL 108  CO2 24  GLUCOSE 79  BUN 10  CREATININE 0.71  CALCIUM 9.0  AST 20  ALT 14  ALKPHOS 99  BILITOT 0.4     Microbiology Results  Results for orders placed or performed during the hospital encounter of 10/20/17  MRSA PCR Screening  Status: Abnormal   Collection Time: 10/20/17  6:48 PM  Result Value Ref Range Status   MRSA by PCR POSITIVE (A) NEGATIVE Final    Comment:        The GeneXpert MRSA Assay (FDA approved for NASAL specimens only), is one component of a comprehensive MRSA colonization surveillance program. It is not intended to diagnose MRSA infection nor to guide or monitor treatment for MRSA infections. RESULT CALLED TO, READ BACK BY AND VERIFIED WITH: KAT MURRAY '@2020'  10/20/17 AKT Performed at [facility], [address] [place], [place] [address]     RADIOLOGY:  Dg Chest Portable 1 View  Result Date: 10/20/2017 CLINICAL DATA:  Left-sided facial droop. EXAM: PORTABLE CHEST 1 VIEW COMPARISON:  07/22/2017 FINDINGS: The heart size and mediastinal contours are within normal limits. Moderate aortic atherosclerosis without aneurysm. Both lungs are clear. The visualized skeletal structures are unremarkable. IMPRESSION: No active disease.  Aortic atherosclerosis without aneurysm. Electronically Signed   By: Ashley Royalty M.D.   On: 10/20/2017 17:33     Management plans discussed with the patient, family and they are in agreement.  CODE STATUS: DNR   TOTAL TIME TAKING CARE OF THIS PATIENT: 27 minutes.    Demetrios Loll M.D on 10/21/2017 at 4:12 PM  Between 7am to 6pm - Pager - [phone]  After 6pm go to [email or web address] - Proofreader  [facility]  Office  [phone]  CC: Primary care physician; Tracie Harrier, MD   Note: This dictation was prepared with Dragon dictation along with smaller phrase technology. Any transcriptional  errors that result from this process are unintentional.

## 2017-10-21 NOTE — Progress Notes (Signed)
*  PRELIMINARY RESULTS* Echocardiogram 2D Echocardiogram has been performed.  Sherrie Sport 10/21/2017, 9:04 AM

## 2017-10-21 NOTE — Progress Notes (Signed)
Chaplain responded to an OR for an AD. Pt is being d/c. Pt said she has a form that states she do not want to want to be connected to machines. Chaplain educated on AD and left for review. Chaplin letr her know to contact Chaplain if there are any questions to have Chaplain paged.    10/21/17 1000  Clinical Encounter Type  Visited With Patient  Visit Type Follow-up  Referral From [facility]

## 2017-10-21 NOTE — Care Management Note (Signed)
Case Management Note  Patient Details  Name: Samantha Aguirre MRN: [id number] Date of Birth: 28-Mar-1945  Subjective/Objective:                  Patient from home with TIA, discharging today.  Is open to [facility] for PT; adding OT.  [facility] aware of discharge.   Current with PCP and denies difficulty affording medications or with transportation.    Action/Plan: No further needs at this time.     Expected Discharge Date:  10/21/17               Expected Discharge Plan:  [place]  In-House Referral:     Discharge planning Services  CM Consult  Post Acute Care Choice:  Resumption of Svcs/PTA Provider Choice offered to:     DME Arranged:    DME Agency:     HH Arranged:  OT, PT HH Agency:  [facility]  Status of Service:  Completed, signed off  If discussed at [place] of Stay Meetings, dates discussed:    Additional Comments:  Elza Rafter, RN 10/21/2017, 11:57 AM

## 2017-10-21 NOTE — Plan of Care (Signed)
  Problem: Education: Goal: Knowledge of General Education information will improve Description Including pain rating scale, medication(s)/side effects and non-pharmacologic comfort measures Outcome: Progressing   Problem: Health Behavior/Discharge Planning: Goal: Ability to manage health-related needs will improve Outcome: Progressing   Problem: Safety: Goal: Ability to remain free from injury will improve Outcome: Progressing   Problem: Skin Integrity: Goal: Risk for impaired skin integrity will decrease Outcome: Progressing   Problem: Education: Goal: Knowledge of disease or condition will improve Outcome: Progressing Goal: Knowledge of secondary prevention will improve Outcome: Progressing Goal: Knowledge of patient specific risk factors addressed and post discharge goals established will improve Outcome: Progressing Goal: Individualized Educational Video(s) Outcome: Progressing

## 2017-10-21 NOTE — Progress Notes (Signed)
Pt passed [facility] screen, without difficulties.

## 2017-10-21 NOTE — Progress Notes (Signed)
Subjective: No new neurological complaints.  Reports improvement form presentation on yesterday.  She has been working with PT at home and plans on continuing with therapy at home.  Objective: Current vital signs: BP (!) 146/49 (BP Location: Right Arm)   Pulse (!) 52   Temp 97.8 F (36.6 C) (Oral)   Resp (!) 25   Ht _0  (1.549 m)   Wt 78.9 kg   SpO2 95%   BMI 32.88 kg/m  Vital signs in last 24 hours: Temp:  [97.8 F (36.6 C)-98.6 F (37 C)] 97.8 F (36.6 C) (10/09 0401) Pulse Rate:  [52-75] 52 (10/09 0401) Resp:  [14-25] 25 (10/08 1730) BP: (91-146)/(38-73) 146/49 (10/09 0401) SpO2:  [91 %-100 %] 95 % (10/09 0401) Weight:  [78.9 kg] 78.9 kg (10/08 1829)  Intake/Output from previous day: 10/08 0701 - 10/09 0700 In: 500 [IV Piggyback:500] Out: 400 [Urine:400] Intake/Output this shift: No intake/output data recorded. Nutritional status:  Diet Order            Diet - low sodium heart healthy        Diet heart healthy/carb modified Room service appropriate? Yes; Fluid consistency: Thin  Diet effective now              Neurological Exam  Mental Status: Alert, oriented, thought content appropriate. Speech fluent without evidence of aphasia. Able to follow 3 step commands without difficulty. Attention span and concentration seemed appropriate  Cranial Nerves: II: Discs flat bilaterally; Visual fields grossly normal, pupils equal, round, reactive to light and accommodation III,IV, VI: ptosis not present, extra-ocular motions intact bilaterally V,VII:  Left facial droop, facial light touch sensationintact VIII: hearing normal bilaterally IX,X: gag reflex present XI: bilateral shoulder shrug XII: midline tongue extension Motor: Right :Upper extremity 5/5Without pronator driftLeft: Upper extremity Weak hand grip Right:Lower extremity 5/5Left: Lower extremity 4+/5 Tone and bulk:normal tone throughout; no  atrophy noted Sensory: Pinprick and light touchintact bilaterally   Data Reviewed  Lab Results: Basic Metabolic Panel: Recent Labs  Lab 10/20/17 1400  NA 141  K 3.4*  CL 108  CO2 24  GLUCOSE 79  BUN 10  CREATININE 0.71  CALCIUM 9.0    Liver Function Tests: Recent Labs  Lab 10/20/17 1400  AST 20  ALT 14  ALKPHOS 99  BILITOT 0.4  PROT 6.7  ALBUMIN 4.0   No results for input(s): LIPASE, AMYLASE in the last 168 hours. No results for input(s): AMMONIA in the last 168 hours.  CBC: Recent Labs  Lab 10/20/17 1400  WBC 9.6  NEUTROABS 5.5  HGB 12.6  HCT 39.3  MCV 86.2  PLT 278    Cardiac Enzymes: Recent Labs  Lab 10/20/17 1400  TROPONINI <0.03    Lipid Panel: Recent Labs  Lab 10/21/17 0321  CHOL 142  TRIG 216*  HDL 41  CHOLHDL 3.5  VLDL 43*  LDLCALC 58    CBG: Recent Labs  Lab 10/20/17 1358 10/20/17 1844 10/20/17 2232 10/21/17 0824 10/21/17 1143  GLUCAP 74 72 100* 108* 142*    Microbiology: Results for orders placed or performed during the hospital encounter of 10/20/17  MRSA PCR Screening     Status: Abnormal   Collection Time: 10/20/17  6:48 PM  Result Value Ref Range Status   MRSA by PCR POSITIVE (A) NEGATIVE Final    Comment:        The GeneXpert MRSA Assay (FDA approved for NASAL specimens only), is one component of a comprehensive MRSA colonization surveillance  program. It is not intended to diagnose MRSA infection nor to guide or monitor treatment for MRSA infections. RESULT CALLED TO, READ BACK BY AND VERIFIED WITH: KAT MURRAY _0  10/20/17 AKT Performed at [facility], [address] [place], [place] [address]     Coagulation Studies: Recent Labs    10/20/17 1400  LABPROT 11.9  INR 0.88    Imaging: [name]  Result Date: 10/20/2017 CLINICAL DATA:  Stroke EXAM: MRI HEAD WITHOUT CONTRAST TECHNIQUE: Multiplanar, multiecho pulse sequences of the brain and surrounding structures were  obtained without intravenous contrast. COMPARISON:  CT head 10/20/2017, MRI head 09/22/2017 FINDINGS: Brain: Negative for acute infarct. Multiple areas of chronic ischemia. Chronic infarct left parietal lobe with mild hemorrhage. Chronic microvascular ischemic change throughout the white matter. Chronic infarcts in the cerebellum bilaterally. 1 cm right parietal parasagittal mass compatible with meningioma is unchanged. Vascular: Normal arterial flow voids Skull and upper cervical spine: Negative Sinuses/Orbits: Negative Other: None IMPRESSION: Negative for acute infarct. No change from prior studies. Extensive chronic ischemic changes. Electronically Signed   By: Franchot Gallo M.D.   On: 10/20/2017 16:22   Dg Chest Portable 1 View  Result Date: 10/20/2017 CLINICAL DATA:  Left-sided facial droop. EXAM: PORTABLE CHEST 1 VIEW COMPARISON:  07/22/2017 FINDINGS: The heart size and mediastinal contours are within normal limits. Moderate aortic atherosclerosis without aneurysm. Both lungs are clear. The visualized skeletal structures are unremarkable. IMPRESSION: No active disease.  Aortic atherosclerosis without aneurysm. Electronically Signed   By: Ashley Royalty M.D.   On: 10/20/2017 17:33   Ct Head Code Stroke Wo Contrast  Result Date: 10/20/2017 CLINICAL DATA:  Code stroke.  Left facial droop, slurred speech EXAM: CT HEAD WITHOUT CONTRAST TECHNIQUE: Contiguous axial images were obtained from the base of the skull through the vertex without intravenous contrast. COMPARISON:  MRI head 09/22/2017.  CT head 09/22/2017 FINDINGS: Brain: Negative for acute infarct. Negative for acute hemorrhage or mass Chronic infarct left parietal lobe. Extensive chronic microvascular ischemic change in the white matter. Small chronic infarcts in the cerebellum bilaterally. Vascular: Negative for hyperdense vessel Skull: Negative Sinuses/Orbits: Negative Other: None ASPECTS ([place] Stroke Program Early CT Score) - Ganglionic level  infarction (caudate, lentiform nuclei, internal capsule, insula, M1-M3 cortex): 7 - Supraganglionic infarction (M4-M6 cortex): 3 Total score (0-10 with 10 being normal): 10 IMPRESSION: 1. No acute intracranial abnormality 2. ASPECTS is 10 3. Extensive chronic ischemic changes stable from recent studies. 4. These results were called by telephone at the time of interpretation on 10/20/2017 at 2:00 pm to Dr. Cinda Quest, who verbally acknowledged these results. Electronically Signed   By: Franchot Gallo M.D.   On: 10/20/2017 14:01    Medications:  I have reviewed the patient's current medications. Prior to Admission:  Medications Prior to Admission  Medication Sig Dispense Refill Last Dose  . albuterol (PROVENTIL HFA;VENTOLIN HFA) 108 (90 Base) MCG/ACT inhaler Inhale 2 puffs into the lungs every 6 (six) hours as needed for wheezing or shortness of breath. 3 Inhaler 1 Past Week at PRN  . atorvastatin (LIPITOR) 80 MG tablet Take 1 tablet (80 mg total) by mouth daily at 6 PM. 90 tablet 3 10/19/2017 at 2000  . colestipol (COLESTID) 1 g tablet Take 2 g by mouth 2 (two) times daily.  11 10/20/2017 at 0800  . DULoxetine (CYMBALTA) 60 MG capsule Take 1 capsule (60 mg total) by mouth at bedtime. 30 capsule 11 10/19/2017 at 2000  . ferrous sulfate 325 (65  FE) MG EC tablet Take 1 tablet (325 mg total) by mouth 2 (two) times daily with a meal. (Patient taking differently: Take 325 mg by mouth daily. ) 60 tablet 0 10/20/2017 at 0800  . fluticasone (FLONASE) 50 MCG/ACT nasal spray Place 1 spray into both nostrils daily. 48 g 3 10/20/2017 at 0800  . furosemide (LASIX) 20 MG tablet Take 20 mg by mouth daily.  11 10/20/2017 at 0800  . gabapentin (NEURONTIN) 800 MG tablet Take 1 tablet (800 mg total) by mouth 2 (two) times daily. 180 tablet 1 10/20/2017 at 0800  . glipiZIDE-metformin (METAGLIP) 5-500 MG tablet Take 1 tablet by mouth 2 (two) times daily before a meal. (Patient taking differently: Take 1-2 tablets by mouth See admin  instructions. Take 2 tablets by mouth every morning and 1 tablet by mouth every night) 180 tablet 1 10/20/2017 at 0800  . levETIRAcetam (KEPPRA) 500 MG tablet Take 1 tablet (500 mg total) by mouth 2 (two) times daily. 60 tablet 0 10/20/2017 at 0800  . levothyroxine (SYNTHROID, LEVOTHROID) 112 MCG tablet Take 1 tablet (112 mcg total) by mouth daily before breakfast.   10/20/2017 at 0700  . lisinopril (PRINIVIL,ZESTRIL) 10 MG tablet Take 10 mg by mouth daily.  5 10/20/2017 at 0800  . nitroGLYCERIN (NITROSTAT) 0.4 MG SL tablet Place 1 tablet (0.4 mg total) under the tongue every 5 (five) minutes as needed for chest pain. 30 tablet 0 Past Month at PRN  . omeprazole (PRILOSEC) 20 MG capsule Take 20 mg by mouth 2 (two) times daily.  3 10/20/2017 at 0800  . potassium chloride SA (K-DUR,KLOR-CON) 20 MEQ tablet Take 20 mEq by mouth 2 (two) times daily.   10/20/2017 at 0800  . sucralfate (CARAFATE) 1 g tablet TAKE (1) TABLET BY MOUTH FOUR TIMES A DAY (Patient taking differently: Take 1 g by mouth 4 (four) times daily. ) 360 tablet 1 10/20/2017 at 0800  . topiramate (TOPAMAX) 25 MG tablet Take 25 mg by mouth 2 (two) times daily.  3 10/20/2017 at 0800  . vitamin B-12 (CYANOCOBALAMIN) 1000 MCG tablet Take 1 tablet by mouth daily.  1 10/20/2017 at 0800  . aspirin 325 MG tablet Take 1 tablet (325 mg total) by mouth daily. (Patient not taking: Reported on 10/20/2017) 30 tablet 0 Not Taking at Unknown time  . blood glucose meter kit and supplies Dispense based on patient and insurance preference. Use up to four times daily as directed. (FOR ICD-10 E10.9, E11.9). (Patient not taking: Reported on 09/28/2017) 1 each 0 Not Taking at Unknown time  . diclofenac sodium (VOLTAREN) 1 % GEL Apply 2 g topically 4 (four) times daily. (Patient not taking: Reported on 10/20/2017) 1 Tube 1 Not Taking at Unknown time  . feeding supplement, GLUCERNA SHAKE, (GLUCERNA SHAKE) LIQD Take 237 mLs by mouth 2 (two) times daily between meals. (Patient not  taking: Reported on 09/28/2017) 30 Can 0 Not Taking at Unknown time  . pantoprazole (PROTONIX) 40 MG tablet Take 1 tablet (40 mg total) by mouth 2 (two) times daily. (Patient not taking: Reported on 09/28/2017) 60 tablet 0 Not Taking at Unknown time  . umeclidinium bromide (INCRUSE ELLIPTA) 62.5 MCG/INH AEPB Inhale 1 puff into the lungs daily. (Patient not taking: Reported on 10/20/2017) 1 each 11 Not Taking at Unknown time   Scheduled: . aspirin EC  325 mg Oral Daily  . Chlorhexidine Gluconate Cloth  6 each Topical Q0600  . enoxaparin (LOVENOX) injection  40 mg Subcutaneous Q24H  .  insulin aspart  0-9 Units Subcutaneous TID WC  . mupirocin ointment  1 application Nasal BID    Patient seen and examined.  Clinical course and management discussed.  Necessary edits performed.  I agree with the above.  Assessment and plan of care developed and discussed below.     Assessment: 72 year old female with pertinent history of stroke, seizures, hypertension, hyperlipidemia, diabetes with complications, tobacco abuse, and atrial fibrillation presenting with left facial numbness, left facial droop, headache, and  left hand weakness.  Continues with baseline left sided findings.  Concerns for possible cerebral hypoperfusion due to low blood pressure which is now improved with hydration.  Plan: 1.  Continue aspirin 2.  Plan follow-up with outpatient electrophysiology as scheduled for watchman device for A. fib.  Unable to tolerate anticoagulation to due to GI bleed. 3.  Statin to keep LDL less than 70 mg/dL. 4.  Continue follow up with neurology   This patient was staffed with Dr. Magda Paganini, Doy Mince who personally evaluated patient, reviewed documentation and agreed with assessment and plan of care as above.  Rufina Falco, DNP, FNP-BC Board certified Nurse Practitioner Neurology Department   LOS: 0 days   10/21/2017  2:48 PM  Alexis Goodell, MD Neurology [phone]  10/21/2017  3:24 PM

## 2017-10-21 NOTE — Discharge Instructions (Signed)
Resume HHPT. Smoking cessation.

## 2017-10-21 NOTE — Progress Notes (Signed)
SLP Cancellation Note  Patient Details Name: Samantha Aguirre MRN: [id number] DOB: 12/19/1945   Cancelled treatment:       Reason Eval/Treat Not Completed: SLP screened, no needs identified, will sign off(chart reviewed; consulted NSG then met w/ pt). Pt denied any difficulty swallowing and is currently on a regular diet; tolerates swallowing pills w/ water per NSG. Pt conversed at conversational level w/out gross deficits noted; pt denied any new speech-language deficits. Intelligibility was 100%, and pt was able to use the phone independently to order lunch and call the Nurse re: her discharge plans/time. NSG has not noted any gross language deficits today during interactions.  No further skilled ST services indicated as pt appears at her baseline. Pt agreed. NSG to reconsult if any change in status.     Orinda Kenner, MS, CCC-SLP Jaelene Garciagarcia 10/21/2017, 3:10 PM

## 2017-10-21 NOTE — Evaluation (Signed)
Occupational Therapy Evaluation Patient Details Name: Samantha Aguirre MRN: [id number] DOB: 07-Feb-1945 Today's Date: 10/21/2017    History of Present Illness Pt is a 72 y/o female admitted 10/20/17 with L side weakness. PMHx significant for CVA (04/2017) with recent TPA, afib not on anticoagulation d/t recent GI bleed, DM type 2, seizures, hyperlipidemia, arthritis, CHF, tremors.  MRI negative for acute abnormailty, chronic posterior L MCA territory, remote R thalamic lacunar, and B cerebellar infarcts.    Clinical Impression   Pt seen for OT evaluation this date. Prior to hospital admission, pt was modified indep using RW for mobility and ADL. Family/friends provide intermittent assist for meals, pt receives prefilled medications, and noted medical transportation used.  Pt lives alone in a 1 story apartment.  Currently pt demonstrates impairments in L side strength (grossly 4-/5), sensation, balance, pain (headache and L side shooting pain with MMT of L shoulder, underneath R breast with rash), and dizziness noted once sitting upright and attempting to stand which did not resolve when pt was back sitting. Additionally, pt with some baseline L side weakness from previous CVA. Pt requires min-mod assist for LB ADL and min assist for limited functional transfer training with RW. Pt instructed in foot/hand placement to improve safety/independence. RN notified of pain.  Pt would benefit from skilled OT to address noted impairments and functional limitations (see below for any additional details) in order to maximize safety and independence while minimizing falls risk and caregiver burden. Pending resolution of dizziness and ability to further mobilize with PT, recommend pt discharge to home with [facility] services.     Follow Up Recommendations  Supervision/Assistance - 24 hour;Home health OT    Equipment Recommendations  Tub/shower bench;3 in 1 bedside commode    Recommendations for Other Services        Precautions / Restrictions Precautions Precautions: Fall Restrictions Weight Bearing Restrictions: No      Mobility Bed Mobility Overal bed mobility: Needs Assistance Bed Mobility: Supine to Sit;Sit to Supine     Supine to sit: Supervision;HOB elevated Sit to supine: Supervision   General bed mobility comments: additional time/effort, pt supporting LLE to bring to side of the bed, noting she has had to do this since her last stroke in April 2019  Transfers Overall transfer level: Needs assistance Equipment used: Rolling walker (2 wheeled) Transfers: Sit to/from Stand Sit to Stand: Min assist;Min guard         General transfer comment: initial STS t/f CGA but pt unable to come fully upright 2/2 R knee stiffness. After seated AROM to "warm up" her legs and cues to scoot closer to EOB, pt able to perform with min assist     Balance Overall balance assessment: Needs assistance Sitting-balance support: Feet supported;No upper extremity supported Sitting balance-Leahy Scale: Fair     Standing balance support: Bilateral upper extremity supported Standing balance-Leahy Scale: Poor Standing balance comment: pt required BUE on RW for support                           ADL either performed or assessed with clinical judgement   ADL Overall ADL's : Needs assistance/impaired Eating/Feeding: Sitting;Independent   Grooming: Sitting;Supervision/safety   Upper Body Bathing: Sitting;Supervision/ safety   Lower Body Bathing: Sit to/from stand;Minimal assistance;Moderate assistance   Upper Body Dressing : Sitting;Supervision/safety   Lower Body Dressing: Sit to/from stand;Minimal assistance;Moderate assistance   Toilet Transfer: RW;Stand-pivot;BSC;Minimal assistance;Min guard  Vision Patient Visual Report: No change from baseline       Perception     Praxis      Pertinent Vitals/Pain Pain Assessment: 0-10 Pain Score: 8  Pain Location:  headache, not worse with mobility as well as pain shooting down L side with MMT of arm and leg Pain Descriptors / Indicators: Headache Pain Intervention(s): Limited activity within patient's tolerance;Monitored during session;Repositioned;Patient requesting pain meds-RN notified     Hand Dominance Right   Extremity/Trunk Assessment Upper Extremity Assessment Upper Extremity Assessment: LUE deficits/detail(RUE WFL) LUE Deficits / Details: grossly 4-5, impaired sensation, no pronator drift noted, pt endorses occasional tremors in B hands LUE Sensation: decreased light touch   Lower Extremity Assessment Lower Extremity Assessment: Defer to PT evaluation;LLE deficits/detail(RLE at least 4/5) LLE Deficits / Details: LLE at least 4-/5, impaired sensation LLE Sensation: decreased light touch   Cervical / Trunk Assessment Cervical / Trunk Assessment: Normal   Communication Communication Communication: Expressive difficulties(word finding deficits)   Cognition Arousal/Alertness: Awake/alert Behavior During Therapy: WFL for tasks assessed/performed Overall Cognitive Status: Within Functional Limits for tasks assessed                                     General Comments  red, pain, and odorous area on skin underneath R breast, RN notified    Exercises Other Exercises Other Exercises: functional transfer training EOB requiring cues for technique to improve safety/indep and ultimately min assist for transfers and CGA for side scooting along EOB   Shoulder Instructions      Home Living Family/patient expects to be discharged to:: Private residence Living Arrangements: Alone Available Help at Discharge: Family;Friend(s);Available PRN/intermittently Type of Home: Apartment Home Access: Level entry     Home Layout: One level     Bathroom Shower/Tub: Teacher, early years/pre: Standard     Home Equipment: Environmental consultant - 2 wheels;Shower seat;Adaptive equipment;Cane  - single point;Grab bars - tub/shower Adaptive Equipment: Reacher Additional Comments: pt reports getting a power wheelchair soon      Prior Functioning/Environment Level of Independence: Independent with assistive device(s)        Comments: Pt indep with ADL, fearful of falls, has someone in home when she showers in case she needs help, ambulating with RW, was getting HHOT and HHPT and HHRN at home         OT Problem List: Decreased strength;Decreased knowledge of use of DME or AE;Impaired UE functional use;Decreased cognition;Impaired sensation;Pain;Decreased safety awareness;Impaired balance (sitting and/or standing)      OT Treatment/Interventions: Self-care/ADL training;Balance training;Therapeutic exercise;Therapeutic activities;Neuromuscular education;DME and/or AE instruction;Cognitive remediation/compensation;Patient/family education    OT Goals(Current goals can be found in the care plan section) Acute Rehab OT Goals Patient Stated Goal: to go home and return to PLOF OT Goal Formulation: With patient Time For Goal Achievement: 11/04/17 Potential to Achieve Goals: Good ADL Goals Pt Will Perform Lower Body Dressing: sit to/from stand;with adaptive equipment;with modified independence Pt Will Transfer to Toilet: with modified independence;ambulating(elevated commode, LRAD for amb) Pt Will Perform Tub/Shower Transfer: with supervision;tub bench;ambulating;rolling walker  OT Frequency: Min 1X/week   Barriers to D/C: Decreased caregiver support          Co-evaluation              AM-PAC PT "6 Clicks" Daily Activity     Outcome Measure Help from another person eating meals?: None Help from another  person taking care of personal grooming?: None Help from another person toileting, which includes using toliet, bedpan, or urinal?: A Little Help from another person bathing (including washing, rinsing, drying)?: A Lot Help from another person to put on and taking off  regular upper body clothing?: A Little Help from another person to put on and taking off regular lower body clothing?: A Lot 6 Click Score: 18   End of Session Nurse Communication: Patient requests pain meds;Other (comment)(red rash underneath R breast, mild dizziness noted upon standing)  Activity Tolerance: Patient tolerated treatment well Patient left: in bed;with call bell/phone within reach;with bed alarm set  OT Visit Diagnosis: Other abnormalities of gait and mobility (R26.89);Hemiplegia and hemiparesis;Pain Hemiplegia - Right/Left: Left Hemiplegia - dominant/non-dominant: Non-Dominant Hemiplegia - caused by: Cerebral infarction Pain - Right/Left: Left Pain - part of body: Arm;Hip;Leg(and headache )                Time: 7209-4709 OT Time Calculation (min): 40 min Charges:  OT General Charges $OT Visit: 1 Visit OT Evaluation $OT Eval Moderate Complexity: 1 Mod OT Treatments $Self Care/Home Management : 8-22 mins  Jeni Salles, MPH, MS, OTR/L ascom [phone] 10/21/17, 12:53 PM

## 2017-10-21 NOTE — Clinical Social Work Note (Addendum)
CSW received consult for patient needing transportation back home.  CSW called Medicaid transportation, and they said that [place] is booked.  CSW discussed with patient to see if she had anyone else who can give her a ride home, and she said no.  CSW provided a cab voucher for patient to discharge back home.  Patient does not have any other needs, CSW to sign off please reconsult if social work needs arise.  Jones Broom. [place], MSW, [place]  10/21/2017 4:51 PM

## 2017-10-21 NOTE — Progress Notes (Addendum)
[name] to be D/C'd Home with home health per MD order.  Discussed prescriptions and follow up appointments with the patient. , medication list explained in detail. Pt verbalized understanding.  Allergies as of 10/21/2017      Reactions   Methotrexate Rash   Mushroom Extract Complex Anaphylaxis   Made patient "deathly sick"   Atenolol    "MD took me off of it bc it was doing something wrong" Made patient HYPOTENSIVE   Ivp Dye [iodinated Diagnostic Agents] Itching   Pt injected with IV contrast.  5 min after injection pt complained of itching behind ear and on her abd.   Morphine And Related Other (See Comments)   "stops my breathing" NEAR-RESPIRATORY ARREST   Oxycodone Hcl    Percocet [oxycodone-acetaminophen] Nausea And Vomiting, Other (See Comments)   Stomach pains   Percodan [oxycodone-aspirin] Nausea And Vomiting, Other (See Comments)   Stomach pains   Tramadol Nausea Only   Verapamil Other (See Comments)   " MD told me this was screwing me up" MAKES PATIENT HYPOTENSIVE      Medication List    STOP taking these medications   furosemide 20 MG tablet Commonly known as:  LASIX   lisinopril 10 MG tablet Commonly known as:  PRINIVIL,ZESTRIL     TAKE these medications   albuterol 108 (90 Base) MCG/ACT inhaler Commonly known as:  PROVENTIL HFA;VENTOLIN HFA Inhale 2 puffs into the lungs every 6 (six) hours as needed for wheezing or shortness of breath.   aspirin 325 MG tablet Take 1 tablet (325 mg total) by mouth daily.   atorvastatin 80 MG tablet Commonly known as:  LIPITOR Take 1 tablet (80 mg total) by mouth daily at 6 PM.   blood glucose meter kit and supplies Dispense based on patient and insurance preference. Use up to four times daily as directed. (FOR ICD-10 E10.9, E11.9).   colestipol 1 g tablet Commonly known as:  COLESTID Take 2 g by mouth 2 (two) times daily.   diclofenac sodium 1 % Gel Commonly known as:  VOLTAREN Apply 2 g topically 4 (four)  times daily.   DULoxetine 60 MG capsule Commonly known as:  CYMBALTA Take 1 capsule (60 mg total) by mouth at bedtime.   feeding supplement (GLUCERNA SHAKE) Liqd Take 237 mLs by mouth 2 (two) times daily between meals.   ferrous sulfate 325 (65 FE) MG EC tablet Take 1 tablet (325 mg total) by mouth 2 (two) times daily with a meal. What changed:  when to take this   fluticasone 50 MCG/ACT nasal spray Commonly known as:  FLONASE Place 1 spray into both nostrils daily.   gabapentin 800 MG tablet Commonly known as:  NEURONTIN Take 1 tablet (800 mg total) by mouth 2 (two) times daily.   glipiZIDE-metformin 5-500 MG tablet Commonly known as:  METAGLIP Take 1 tablet by mouth 2 (two) times daily before a meal. What changed:    how much to take  when to take this  additional instructions   levETIRAcetam 500 MG tablet Commonly known as:  KEPPRA Take 1 tablet (500 mg total) by mouth 2 (two) times daily.   levothyroxine 112 MCG tablet Commonly known as:  SYNTHROID, LEVOTHROID Take 1 tablet (112 mcg total) by mouth daily before breakfast.   nitroGLYCERIN 0.4 MG SL tablet Commonly known as:  NITROSTAT Place 1 tablet (0.4 mg total) under the tongue every 5 (five) minutes as needed for chest pain.   omeprazole 20 MG capsule Commonly  known as:  PRILOSEC Take 20 mg by mouth 2 (two) times daily.   pantoprazole 40 MG tablet Commonly known as:  PROTONIX Take 1 tablet (40 mg total) by mouth 2 (two) times daily.   potassium chloride SA 20 MEQ tablet Commonly known as:  K-DUR,KLOR-CON Take 20 mEq by mouth 2 (two) times daily.   sucralfate 1 g tablet Commonly known as:  CARAFATE TAKE (1) TABLET BY MOUTH FOUR TIMES A DAY What changed:  See the new instructions.   topiramate 25 MG tablet Commonly known as:  TOPAMAX Take 25 mg by mouth 2 (two) times daily.   umeclidinium bromide 62.5 MCG/INH Aepb Commonly known as:  INCRUSE ELLIPTA Inhale 1 puff into the lungs daily.    vitamin B-12 1000 MCG tablet Commonly known as:  CYANOCOBALAMIN Take 1 tablet by mouth daily.       Vitals:   10/21/17 0149 10/21/17 0401  BP: 117/63 (!) 146/49  Pulse: (!) 58 (!) 52  Resp:    Temp:  97.8 F (36.6 C)  SpO2: 97% 95%    Tele box removed and returned.Skin clean, dry and intact without evidence of skin break down, no evidence of skin tears noted. IV catheter discontinued intact. Site without signs and symptoms of complications. Dressing and pressure applied. Pt denies pain at this time. No complaints noted.   An After Visit Summary was printed and given to the patient. Patient escorted via [place], and D/C home via [place] taxi. Saralyn Pilar given to pt.  Rolley Sims

## 2017-10-22 ENCOUNTER — Encounter: Payer: Self-pay | Admitting: *Deleted

## 2017-10-22 ENCOUNTER — Other Ambulatory Visit: Payer: Self-pay | Admitting: Family Medicine

## 2017-10-22 ENCOUNTER — Other Ambulatory Visit: Payer: Self-pay | Admitting: *Deleted

## 2017-10-22 DIAGNOSIS — M545 Low back pain: Principal | ICD-10-CM

## 2017-10-22 DIAGNOSIS — G8929 Other chronic pain: Secondary | ICD-10-CM

## 2017-10-22 DIAGNOSIS — J449 Chronic obstructive pulmonary disease, unspecified: Secondary | ICD-10-CM

## 2017-10-22 DIAGNOSIS — I110 Hypertensive heart disease with heart failure: Secondary | ICD-10-CM | POA: Diagnosis not present

## 2017-10-22 DIAGNOSIS — I480 Paroxysmal atrial fibrillation: Secondary | ICD-10-CM | POA: Diagnosis not present

## 2017-10-22 DIAGNOSIS — I69354 Hemiplegia and hemiparesis following cerebral infarction affecting left non-dominant side: Secondary | ICD-10-CM | POA: Diagnosis not present

## 2017-10-22 DIAGNOSIS — I2510 Atherosclerotic heart disease of native coronary artery without angina pectoris: Secondary | ICD-10-CM | POA: Diagnosis not present

## 2017-10-22 DIAGNOSIS — E1140 Type 2 diabetes mellitus with diabetic neuropathy, unspecified: Secondary | ICD-10-CM | POA: Diagnosis not present

## 2017-10-22 DIAGNOSIS — I5032 Chronic diastolic (congestive) heart failure: Secondary | ICD-10-CM | POA: Diagnosis not present

## 2017-10-22 DIAGNOSIS — J410 Simple chronic bronchitis: Secondary | ICD-10-CM

## 2017-10-22 NOTE — Patient Outreach (Signed)
[facility]) Care Management  10/22/2017  Samantha Aguirre 03/24/1945 [id number]  Telephone assessment   Samantha Aguirre is an 72 y.o. female  Recent admission to [facility] 108-10/9 Observation , Dx: TIA.  PMH includes but not limited to : Diabetes , COPD, Left side weakness, stroke, GI bleed Recent admissions : 9/10, with TIA  Transition of care by PCP office   Successful outreach to patient, HIPAA confirmed x 2 identifiers.  Patient reports feeling lousy this am , denies weakness of TIA symptoms when reviewed. Patient states she feels like she is getting a cold coming on. She denies discolored sputum color, shortness of breath or fever.  Patient reports problems with her stomach, states she recently saw GI doctor and he has ordered medication that she is awaiting from mail order. She denies having emesis , or noting blood in stool   Patient reports going to Neurology appointment on 10/8 and being sent to hospital for evaluation for facial drooping, slurred speech.    Patient noted her biggest concern is not having her walker , reports not coming home with it from hospital and states the last time she was it was when she was going for CT scan.  Patient states her daughter has been on the phone today trying to track it down I placed call to office of patient experience able to leave message regarding patient missing walker and safety need for equipment.  Placed call to [facility] home [facility] to determine if they had loaner walker for patient to use, clinical representative states they do not , but will alert [facility] that is scheduled to visit patient today of concern.  Placed call to unit 2A where patient was discharged on 10/9, able to leave a message for return call. Placed call to ED able to speak with ED navigator RN , that reviewed records and looked in storage area to find one walker that is not identified as belonging to physical therapy.  Received call from   office of patient experience to explained concern regarding patient walker she discussed that patient had already left a message for office. Explained above that ED staff may have located walker, patient does not have transportation neither does her daughter discussed with her that I was agreeable to picking up walker and delivering to patient .    Discussed with patient discharge instruction and medication changes she identified Lasix and Lisinopril has been stopped. Verbally reviewed medications .  Patient discussed upcoming cardiology follow up at [facility] about the watchman device on 11/4, she also has scheduled PCP visit on 10/17.   Plan  Will plan scheduled home visit in the next 2 weeks.  Picked up walker at [facility] ED and delivered to patient home she verified that it was her walker.    [name], RN, [place] Management Coordinator  [phone]- Mobile [phone]- Toll Free Main Office  [facility] CM Care Plan Problem One     Most Recent Value  Care Plan Problem One  Recent hospital readmission related to TIA   Role Documenting the Problem One  Care Management [place] for Problem One  Active  [facility] Long Term Goal   Patient will not experience a hospital admission in the next 31  days   [facility] Long Term Goal Start Date  10/22/17  [facility] CM Short Term Goal #1   Patient will attend all medical appointments over the next 30 days   [facility] CM Short Term Goal #  1 Start Date  10/22/17  [facility] CM Short Term Goal #2   Patient will be able to have walker scooter in place over the next 30 days   [facility] CM Short Term Goal #2 Start Date  10/22/17  [facility] CM Short Term Goal #3  Patient will be able to report at least 3 symptoms of CVA/TIA and action plan   [facility] CM Short Term Goal #3 Start Date  10/22/17

## 2017-10-23 ENCOUNTER — Other Ambulatory Visit: Payer: Self-pay | Admitting: Family Medicine

## 2017-10-23 DIAGNOSIS — I69354 Hemiplegia and hemiparesis following cerebral infarction affecting left non-dominant side: Secondary | ICD-10-CM | POA: Diagnosis not present

## 2017-10-23 DIAGNOSIS — I5032 Chronic diastolic (congestive) heart failure: Secondary | ICD-10-CM | POA: Diagnosis not present

## 2017-10-23 DIAGNOSIS — I110 Hypertensive heart disease with heart failure: Secondary | ICD-10-CM | POA: Diagnosis not present

## 2017-10-23 DIAGNOSIS — I480 Paroxysmal atrial fibrillation: Secondary | ICD-10-CM | POA: Diagnosis not present

## 2017-10-23 DIAGNOSIS — I2510 Atherosclerotic heart disease of native coronary artery without angina pectoris: Secondary | ICD-10-CM | POA: Diagnosis not present

## 2017-10-23 DIAGNOSIS — Z889 Allergy status to unspecified drugs, medicaments and biological substances status: Secondary | ICD-10-CM

## 2017-10-23 DIAGNOSIS — E1140 Type 2 diabetes mellitus with diabetic neuropathy, unspecified: Secondary | ICD-10-CM | POA: Diagnosis not present

## 2017-10-26 ENCOUNTER — Other Ambulatory Visit: Payer: Self-pay | Admitting: Family Medicine

## 2017-10-26 DIAGNOSIS — E1140 Type 2 diabetes mellitus with diabetic neuropathy, unspecified: Secondary | ICD-10-CM | POA: Diagnosis not present

## 2017-10-26 DIAGNOSIS — I2510 Atherosclerotic heart disease of native coronary artery without angina pectoris: Secondary | ICD-10-CM | POA: Diagnosis not present

## 2017-10-26 DIAGNOSIS — I5032 Chronic diastolic (congestive) heart failure: Secondary | ICD-10-CM | POA: Diagnosis not present

## 2017-10-26 DIAGNOSIS — I110 Hypertensive heart disease with heart failure: Secondary | ICD-10-CM | POA: Diagnosis not present

## 2017-10-26 DIAGNOSIS — I69354 Hemiplegia and hemiparesis following cerebral infarction affecting left non-dominant side: Secondary | ICD-10-CM | POA: Diagnosis not present

## 2017-10-26 DIAGNOSIS — I480 Paroxysmal atrial fibrillation: Secondary | ICD-10-CM | POA: Diagnosis not present

## 2017-10-27 ENCOUNTER — Inpatient Hospital Stay: Payer: Medicare Other | Admitting: Oncology

## 2017-10-27 ENCOUNTER — Ambulatory Visit: Payer: Medicare Other | Attending: Oncology

## 2017-10-27 ENCOUNTER — Telehealth: Payer: Self-pay | Admitting: *Deleted

## 2017-10-27 NOTE — Telephone Encounter (Signed)
Patient was a no show for LDCT screening x 3.  Patient moved to not screened at this time.

## 2017-10-29 DIAGNOSIS — I5032 Chronic diastolic (congestive) heart failure: Secondary | ICD-10-CM | POA: Diagnosis not present

## 2017-10-29 DIAGNOSIS — I110 Hypertensive heart disease with heart failure: Secondary | ICD-10-CM | POA: Diagnosis not present

## 2017-10-29 DIAGNOSIS — I482 Chronic atrial fibrillation, unspecified: Secondary | ICD-10-CM | POA: Diagnosis not present

## 2017-10-29 DIAGNOSIS — I69354 Hemiplegia and hemiparesis following cerebral infarction affecting left non-dominant side: Secondary | ICD-10-CM | POA: Diagnosis not present

## 2017-10-29 DIAGNOSIS — G40909 Epilepsy, unspecified, not intractable, without status epilepticus: Secondary | ICD-10-CM | POA: Diagnosis not present

## 2017-10-29 DIAGNOSIS — I480 Paroxysmal atrial fibrillation: Secondary | ICD-10-CM | POA: Diagnosis not present

## 2017-10-29 DIAGNOSIS — E1140 Type 2 diabetes mellitus with diabetic neuropathy, unspecified: Secondary | ICD-10-CM | POA: Diagnosis not present

## 2017-10-29 DIAGNOSIS — F17209 Nicotine dependence, unspecified, with unspecified nicotine-induced disorders: Secondary | ICD-10-CM | POA: Diagnosis not present

## 2017-10-29 DIAGNOSIS — I2510 Atherosclerotic heart disease of native coronary artery without angina pectoris: Secondary | ICD-10-CM | POA: Diagnosis not present

## 2017-10-29 DIAGNOSIS — J449 Chronic obstructive pulmonary disease, unspecified: Secondary | ICD-10-CM | POA: Diagnosis not present

## 2017-10-29 DIAGNOSIS — Z86010 Personal history of colonic polyps: Secondary | ICD-10-CM | POA: Diagnosis not present

## 2017-10-29 DIAGNOSIS — I69359 Hemiplegia and hemiparesis following cerebral infarction affecting unspecified side: Secondary | ICD-10-CM | POA: Diagnosis not present

## 2017-10-29 DIAGNOSIS — E1165 Type 2 diabetes mellitus with hyperglycemia: Secondary | ICD-10-CM | POA: Diagnosis not present

## 2017-10-29 DIAGNOSIS — I10 Essential (primary) hypertension: Secondary | ICD-10-CM | POA: Diagnosis not present

## 2017-10-29 DIAGNOSIS — Z23 Encounter for immunization: Secondary | ICD-10-CM | POA: Diagnosis not present

## 2017-11-03 ENCOUNTER — Other Ambulatory Visit: Payer: Self-pay | Admitting: *Deleted

## 2017-11-03 NOTE — Patient Outreach (Addendum)
[facility]) Care Management  [place]  11/03/2017   Samantha Aguirre Sep 13, 1945 [id number]  Subjective:   Arrived for scheduled home visit, no answer at the door, placed call to patient unable to leave a voice mail message as states no voicemail set up. Placed call to patient daughter on [facility] consent , Samantha Aguirre no answer able to leave a HIPAA compliant message for return call.    Received return call from patient daughter , Samantha Aguirre HIPAA verified , daughter discussed that patient is in [employer] helping her sister and will return home by tomorrow.    Plan:  Will plan return call to patient in the next 3 to 4 business days.    [name], RN, [place] Management Coordinator  [phone]- Mobile [phone]- Toll Free Main Office

## 2017-11-04 ENCOUNTER — Other Ambulatory Visit: Payer: Self-pay

## 2017-11-04 ENCOUNTER — Ambulatory Visit: Payer: Medicare Other | Attending: Internal Medicine | Admitting: Physical Therapy

## 2017-11-04 ENCOUNTER — Encounter: Payer: Self-pay | Admitting: Physical Therapy

## 2017-11-04 DIAGNOSIS — R262 Difficulty in walking, not elsewhere classified: Secondary | ICD-10-CM

## 2017-11-04 DIAGNOSIS — M6281 Muscle weakness (generalized): Secondary | ICD-10-CM | POA: Diagnosis not present

## 2017-11-04 DIAGNOSIS — R2689 Other abnormalities of gait and mobility: Secondary | ICD-10-CM

## 2017-11-04 DIAGNOSIS — M79605 Pain in left leg: Secondary | ICD-10-CM | POA: Insufficient documentation

## 2017-11-04 NOTE — Therapy (Signed)
[facility] SERVICES [address] [place], [place], [address] Phone: [phone]   Fax:  [phone]  Physical Therapy Evaluation  Patient Details  Name: Samantha Aguirre MRN: [id number] Date of Birth: 06-Nov-1945 No data recorded  Encounter Date: 11/04/2017  PT End of Session - 11/04/17 1100    Visit Number  1    Number of Visits  1    Date for PT Re-Evaluation  11/04/17    PT Start Time  1100    PT Stop Time  1200    PT Time Calculation (min)  60 min    Activity Tolerance  Patient tolerated treatment well    Behavior During Therapy  [facility] for tasks assessed/performed       Past Medical History:  Diagnosis Date  . Acid reflux   . Arthritis    Bilateral knees, hands, feet  . Asthma   . Atrial fibrillation ([place])   . Cancer ([place])   . CHF (congestive heart failure) (HCC)    Diastolic CHF  . COPD (chronic obstructive pulmonary disease) ([place])   . Coronary artery disease   . Diabetes mellitus without complication ([place])   . Frequent headaches   . GI bleed   . HLD (hyperlipidemia)   . Hypertension   . Seizures ([place])   . Skin cancer 2015   Suspected basal cell carcinoma on nose, surgically removed  . Stroke ([place]) 04/2017  . Thyroid disease   . Tremors of nervous system     Past Surgical History:  Procedure Laterality Date  . ABDOMINAL HYSTERECTOMY  1976  . APPENDECTOMY  1980  . CAROTID ENDARTERECTOMY    . CHOLECYSTECTOMY  1980  . COLONOSCOPY N/A 02/20/2017   Procedure: COLONOSCOPY;  Surgeon: Lin Landsman, MD;  Location: [facility] ENDOSCOPY;  Service: Gastroenterology;  Laterality: N/A;  . ESOPHAGOGASTRODUODENOSCOPY N/A 02/20/2017   Procedure: ESOPHAGOGASTRODUODENOSCOPY (EGD);  Surgeon: Lin Landsman, MD;  Location: [facility] ENDOSCOPY;  Service: Gastroenterology;  Laterality: N/A;  . ESOPHAGOGASTRODUODENOSCOPY (EGD) WITH PROPOFOL N/A 07/08/2017   Procedure: ESOPHAGOGASTRODUODENOSCOPY (EGD) WITH PROPOFOL;  Surgeon: [name], MD;  Location: [facility] ENDOSCOPY;  Service: Endoscopy;  Laterality: N/A;  . [place]  . GIVENS CAPSULE STUDY N/A 07/09/2017   Procedure: GIVENS CAPSULE STUDY;  Surgeon: Jonathon Bellows, MD;  Location: [facility] ENDOSCOPY;  Service: Gastroenterology;  Laterality: N/A;  . KNEE SURGERY     left  . ROTATOR CUFF REPAIR     right  . TONSILLECTOMY  1950    There were no vitals filed for this visit.  PATIENT INFORMATION: This Evaluation form will serve as the LMN for the following suppliers:  Supplier:[employer] Contact Person: Phone:[phone]   Reason for Referral:[employer] Patient/caregiver Goals:to have safe mobility in her home , she is willing and motivated to use a power WC in her home. Patient was seen for face-to-face evaluation for new power wheelchair.    Further paperwork was completed and sent to vendor.  Patient appears to qualify for power mobility device at this time per objective findings.   MEDICAL HISTORY: Diagnosis:stroke, Hypertension, unspecified type; Hemiparesis due to recent stroke ; Chronic a-fib; Chronic obstructive pulmonary disease,  COPD , Seizure disorder, tobacco use disorder,Type 2 diabetes mellitus with hyperglycemia, type 2 diabetes mellitus with diabetic neuropathy, Chronic diastolic CHF (congestive heart failure) Hx of colonic polyps Primary Diagnosis Onset:  cva 2004  _0 Progressive Disease Relevant Past and Future Surgeries: Height: 5 feet 1 inch Weight:172 Explain and recent  changes or trends in weight: no changes  Relevant History including falls:Patient had a fall 11/03/17, She is having 2 falls/ month      HOME ENVIRONMENT: _0 House  _1 Condo/town home  _2 Apartment  _3 Assisted Living    _4 Lives Alone _5  Lives with Others                                                    Hours with caregiver:   _6 Home is accessible to patient            Stairs  _7 Yes _8  No     Ramp _9 Yes _10 No Comments:     COMMUNITY  ADL: TRANSPORTATION: _11 Car    _12 Van    <SPQZRAQTMAUQJFHL>_4<\/TGYBWLSLHTDSKAJG>_81 Public Transportation    _14 Adapted w/c Lift   _15 Ambulance   _16 Other:       _17 Sits in wheelchair during transport  Employment/School:     Specific requirements pertaining to mobility    NA                                                 Other:                                       FUNCTIONAL/SENSORY PROCESSING SKILLS:  Handedness:   _18 Right     _19 Left    _20 NA  Comments:                                 Functional Processing Skills for Wheeled Mobility _21 Processing Skills are adequate for safe wheelchair operation  Areas of concern than may interfere with safe operation of wheelchair Description of problem   _22  Attention to environment     _23 Judgment     _24  Hearing  _25  Vision or visual processing    _26 Motor Planning  _27  Fluctuations in Behavior                                                   VERBAL COMMUNICATION: _28 WFL receptive _29  WFL expressive _30 Understandable  _31 Difficult to understand  _32 non-communicative _33  Uses an augmented communication device    CURRENT SEATING / MOBILITY: Current Mobility Base:   _34 None  _35 Dependent  _36 Manual  _37 Scooter  _38 Power   Type of Control:                       Manufacturer:                         Size:                         Age:                           Current Condition of Mobility Base: Patient had a power chair and it was stollen and now she  does not have a power mobility wc.                                                                                                                  Current Wheelchair components:       NA                                                                                                                            Describe posture in present seating system:   NA                                                                         SENSATION and SKIN ISSUES: Sensation _0 Intact _1 Impaired _2 Absent   Level of sensation:   Finger tips all  digits, bilateral ankle and feet                        Pressure Relief: Able to perform effective pressure relief :   _3 Yes  _4  No Method:    Stand up                                                                           If not, Why?:                                                                          Skin Issues/Skin Integrity Current Skin Issues   _5 Yes _6 No  _7 Intact _8  Red area _9  Open Area  _10 Scar Tissue _11 At risk from prolonged sitting  Where    Right heel  History of Skin Issues   _0 Yes _1 No  Where  Back of right heel                                       When  current                                             Hx of skin flap surgeries _2 Yes _3 No  Where                  When                                                  Limited sitting tolerance _4 Yes _5 No Hours spent sitting in wheelchair daily:                                                         Complaint of Pain:  Please describe:   Left leg pain 8/10   , aching                                                                                                       Swelling/Edema: B ankles and feet                                                                                                                                                 ADL STATUS (in reference to wheelchair use):  Indep Assist Unable Indep with Equip Not assessed Comments  Dressing        x  Eating     x                                                                                                                         Toileting         x                                                                                                                      Bathing       x                                                                                                                               Grooming/ Hygiene        x                                                                                                                     Meal Prep      x  IADLS     x                                                                                                           Bowel Management: _0 Continent  _1 Incontinent  _2 Accidents Comments:                                                  Bladder Management: _3 Continent  _4 Incontinent  _5 Accidents Comments:                                              WHEELCHAIR SKILLS: Manual w/c Propulsion: _6 UE or LE strength and endurance sufficient to participate in ADLs using manual wheelchair Arm :  _7 left _8 right  _9 Both                                   Foot:   _10 left _11 right  _12 Both  Distance:   Operate Scooter: _13  Strength, hand grip, balance and transfer appropriate for use _14 Living environment is accessible for use of scooter  Operate Power w/c:  _15  Std. Joystick   _16  Alternative Controls Indep _17  Assist _18  Dependent/ Unable _19  N/A _20  _21 Safe          _22  Functional      Distance:                Bed confined without wheelchair _23  Yes _24  No   STRENGTH/RANGE OF MOTION:  Range of Motion Strength  Shoulder        Left shoulder 80 deg flex and abd [facility]                                                                                                   Left 2/5 shoulder Right shoulder 3/5  Elbow              [facility]  3/5 BUE  Wrist/Hand                                                   WFL                                                                                3/5 BUE    20 KG right, 10 kg left  Hip                                                       WFL                                                                    L hip 2/5/ R hip 3/5  Knee             WFL             L knee 2/5   , R knee 3/5                                                     Ankle WFL            Left ankle 3/5     , R ankle 3/5                                                        MOBILITY/BALANCE:  _0  Patient is totally dependent for mobility                                                                                               Balance Transfers Ambulation  Sitting Balance: Standing Balance: _1  Independent _2  Independent/Modified Independent  _3  WFL     _4  WFL _5  Supervision _6  Supervision  _7  Uses UE for balance  _8  Supervision _9  Min Assist _10  Ambulates with Assist                           _11   Min Assist _0  Min assist _1  Mod Assist _2  Ambulates with Device:  _3  RW   _4  StW   _5  Cane   _6                 _7  Mod Assist _8  Mod assist _9  Max assist   _10  Max Assist _11  Max assist _12  Dependent _13  Indep. Short Distance Only: 15 feet  _14  Unable _15  Unable _16  Lift / Sling Required Distance (in feet)                     15 feet        _17  Sliding board _18  Unable to Ambulate: (Explain:  Cardio Status:  _19 Intact  _20  Impaired   _21  NA                              Respiratory Status:  _22 Intact   _23 Impaired   _24 NA                                     Orthotics/Prosthetics:    NA                                                                    Comments (Address manual vs power w/c vs scooter):      Patient has HTN, COPD, decreased LUE and LLE strength and BUE finger numbness and BLE ankle / foot numbness   Hypertension, unspecified type; Hemiparesis due to recent stroke ; Chronic a-fib; Chronic obstructive pulmonary disease,  COPD , Seizure disorder, tobacco use disorder,Type 2 diabetes mellitus with hyperglycemia, type 2 diabetes mellitus with diabetic neuropathy, Chronic diastolic CHF (congestive heart failure) Hx of colonic polyps                                                      Anterior / Posterior Obliquity Rotation-Pelvis  PELVIS    _25 Neutral  _26   Posterior  _27  Anterior     _28 WFL  _29 Right Elevated  _30 Left Elevated   _31 WFL  _32 Right Anterior _33   Left Anterior    _34  Fixed _35  Partly Flexible _36  Flexible  _37  Other  _38  Fixed  _39  Partly Flexible  _40  Flexible _41  Other  _42  Fixed  _43  Partly Flexible  _44  Flexible _45  Other  TRUNK _46 WFL _47 Thoracic Kyphosis _48 Lumbar Lordosis   _49  WFL _50 Convex Right _51 Convex Left   _52 c-curve _53 s-curve _54 multiple  _55  Neutral _56  Left-anterior _57  Right-anterior    _58  Fixed _59  Flexible _60  Partly Flexible       Other  _61  Fixed _62  Flexible _63  Partly Flexible _64  Other  _65  Fixed           _66  Flexible _67  Partly Flexible _68  Other   Position Windswept   HIPS  _69  Neutral _70  Abduct _71  ADduct _72  Neutral _73  Right _74  Left       _75  Fixed  _76  Partly Flexible             _77   Dislocated _0  Flexible _1  Subluxed    _2  Fixed _3  Partly Flexible  _4  Flexible _5  Other              Foot Positioning Knee Positioning   Knees and  Feet  _6  WFL _7 Left _8 Right _9  WFL _10 Left _11 Right   KNEES ROM concerns: ROM concerns:   & Dorsi-Flexed                    _12 Lt _13 Rt                                  FEET Plantar Flexed                  _14 Lt _15 Rt     Inversion                    _16 Lt _17 Rt     Eversion                    _18 Lt _19 Rt    HEAD _20  Functional _21  Good Head Control   & _22  Flexed         _23  Extended _24  Adequate Head Control   NECK _25  Rotated  Lt  _26  Lat Flexed Lt _27  Rotated  Rt _28  Lat Flexed Rt _29  Limited Head Control    _30  Cervical Hyperextension _31  Absent  Head Control    SHOULDERS ELBOWS WRIST& HAND         Left     Right    Left     Right  U/E _32 Functional  Left            _33 Functional  Right                                 _34 Fisting             _35 Fisting     _36 elevated Left _37 depressed  Left _38 elevated Right _39 depressed  Right      _40 protracted Left _41 retracted Left _42 protracted Right _43 retracted Right _44 subluxed  Left              _45 subluxed  Right         Goals  for Wheelchair Mobility  _46  Independence with mobility in the home with motor related ADLs (MRADLs)  _47  Independence with MRADLs in the community _48  Provide dependent mobility  _49  Provide recline     _50 Provide tilt   Goals for Seating system _51  Optimize pressure distribution _52  Provide support needed to facilitate function or safety _53  Provide corrective forces to assist with maintaining or improving posture _54  Accommodate client's posture: current seated postures and positions are not flexible or will not tolerate corrective forces _55  Client to be independent with relieving pressure in the wheelchair _56 Enhance physiological function such as breathing, swallowing, digestion  Simulation ideas/Equipment trials:  State why other equipment was unsuccessful:   Patient has LUE ROM deficits and decreased strength BUE with LUE > RUE, and LLE strength deficits , a scooter wont meet her needs due to trunk balance limitations .                                                                             MOBILITY BASE RECOMMENDATIONS and JUSTIFICATION: MOBILITY COMPONENT JUSTIFICATION  Manufacturer:           Model:   [employer] round            Size: Width           Seat Depth             _0 provide transport from point A to B _1 promote Indep mobility  _2 is not a safe, functional ambulator _3 walker or cane inadequate _4 non-standard width/depth necessary to accommodate anatomical measurement _5                             _6 Manual Mobility Base _7 non-functional ambulator    _8 Scooter/POV  _9 can safely operate  _10 can safely transfer   _11 has adequate trunk stability  _12 cannot functionally propel manual w/c  _13 Power Mobility Base  _14 non-ambulatory  _15 cannot functionally propel manual wheelchair  _16  cannot functionally and safely operate scooter/POV _17 can safely operate and willing to   _18 Stroller Base _19 infant/child  _20 unable to propel manual wheelchair _21 allows for growth _22 non-functional ambulator _23 non-functional UE _24 Indep mobility is not a goal at this time  _25 Tilt  _26 Forward                   _27 Backward                  _28 Powered tilt              _29 Manual tilt  _30 change position against gravitational force on head and shoulders  _31 change position for pressure relief/cannot weight shift _32 transfers  _33 management of tone _34 rest periods _35 control edema _36 facilitate postural control  _37                                       _38 Recline  _39 Power recline on power base _40 Manual recline on manual base  _41 accommodate femur to back angle  _42 bring to full recline for ADL care  _43 change position for pressure relief/cannot weight shift _44 rest periods _45 repositioning for transfers or clothing/diaper /catheter changes _46 head positioning  _47 Lighter weight required _48 self- propulsion  _49 lifting _50                                                 _51 Heavy Duty required _52 user weight greater than 250# _53 extreme tone/ over active movement _54 broken frame on previous chair _55                                     _56  Back  _57  Angle  Adjustable _0  Custom molded                           _1 postural control _2 control of tone/spasticity _3 accommodation of range of motion _4 UE functional control _5 accommodation for seating system _6                                          _7 provide lateral trunk support _8 accommodate deformity _9 provide posterior trunk support _10 provide lumbar/sacral support _11 support trunk in midline _12 Pressure relief over spinal processes  _13  Seat Cushion                       _14 impaired sensation  _15 decubitus ulcers present _16 history of pressure ulceration _17 prevent pelvic extension _18 low maintenance  _19 stabilize pelvis  _20 accommodate obliquity _21 accommodate multiple deformity _22 neutralize lower extremity position _23 increase pressure distribution _24                                            _25  Pelvic/thigh support  _26  Lateral thigh guide _27  Distal medial pad  _28  Distal lateral pad _29  pelvis in neutral _30 accommodate pelvis _31  position upper legs _32  alignment _33  accommodate ROM _34  decrease adduction _35 accommodate tone _36 removable for transfers _37 decrease abduction  _38  Lateral trunk Supports _39  Lt     _40  Rt _41 decrease lateral trunk leaning _42 control tone _43 contour for increased contact _44 safety  _45 accommodate asymmetry _46                                                 _47  Mounting hardware  _48 lateral trunk supports  _49 back   _50 seat _51 headrest      _52  thigh support _53 fixed   _54 swing away _55 attach seat platform/cushion to w/c frame _56 attach back cushion to w/c frame _57 mount postural supports _58 mount headrest  _59 swing medial thigh support away _60 swing lateral supports away for transfers  _61                                                     Armrests  _62 fixed _63 adjustable height _64 removable   _65 swing away  _66 flip back   _67 reclining _68 full length pads _69 desk    _70 pads tubular  _71 provide support with elbow at 90   _72 provide support for w/c tray _73 change of height/angles for variable activities _74 remove for transfers _75 allow to come closer to table top _76 remove for access to tables _77                                               Hangers/ Leg rests  _78 60 _79 70 _80 90 _81 elevating _82 heavy duty  _83 articulating _84 fixed _85 lift off _86 swing away     _87 power _88 provide LE support  _89 accommodate to hamstring tightness _90 elevate legs during recline   _91 provide change in position for Legs _92 Maintain placement of feet on footplate _93 durability _94 enable transfers _95 decrease edema _96 Accommodate lower leg length _97   Foot support Footplate    <ZCHYIFOYDXAJOINO>_6<\/VEHMCNOBSJGGEZMO>_2 Lt  _1  Rt  _2  Center mount _3 flip up                            _4 depth/angle adjustable _5 Amputee adapter    _6  Lt     _7  Rt _8 provide foot  support _9 accommodate to ankle ROM _10 transfers _11 Provide support for residual extremity _12  allow foot to go under wheelchair base _13  decrease tone  _14                                                 _15  Ankle strap/heel loops _16 support foot on foot support _17 decrease extraneous movement _18 provide input to heel  _19 protect foot  Tires: _20 pneumatic  _21 flat free inserts  _22 solid  _23 decrease maintenance  _24 prevent frequent flats _25 increase shock absorbency _26 decrease pain from road shock _27 decrease spasms from road shock _28                                              _29  Headrest  _30 provide posterior head support _31 provide posterior neck support _32 provide lateral head support _33 provide anterior head support _34 support during tilt and recline _35 improve feeding   _36 improve respiration _37 placement of switches _38 safety  _39 accommodate ROM  _40 accommodate tone _41 improve visual orientation  _42  Anterior chest strap _43  Vest _44  Shoulder retractors  _45 decrease forward movement of shoulder _46 accommodation of TLSO _47 decrease forward movement of trunk _48 decrease shoulder elevation _49 added abdominal support _50 alignment _51 assistance with shoulder control  _52                                               Pelvic Positioner _53 Belt _54 SubASIS bar _55 Dual Pull _56 stabilize tone _57 decrease falling out of chair/ **will not Decrease potential for sliding due to pelvic tilting _58 prevent excessive rotation _59 pad for protection over boney prominence _60 prominence comfort _61 special pull angle to control rotation _62                                                  Upper ExtremitySupport  _63 L   _64  R _65 Arm trough   _66 hand support _67  tray       _68 full tray _69 swivel mount _70 decrease edema      _71 decrease subluxation   _72 control tone   _73 placement for AAC/Computer/EADL _74 decrease gravitational pull on shoulders _75 provide midline positioning _76 provide support to increase UE function _77 provide hand support in  natural position _78 provide work surface   POWER WHEELCHAIR CONTROLS  _79 Proportional  _80 Non-Proportional Type                                      _81 Left  _82 Right _83 provides access for controlling wheelchair   _84 lacks motor control to operate proportional drive control <HUTMLYYTKPTWSFKC>_1<\/EXNTZGYFVCBSWHQP>_59 unable to understand proportional controls  Actuator Control Module  _86 Single  _87 Multiple   _88 Allow the client to operate the power seat function(s) through the joystick  control   _0 Safety Reset Switches _1 Used to change modes and stop the wheelchair when driving in latch mode    _2 Upgraded Electronics   _3 programming for accurate control _4 progressive Disease/changing condition _5 non-proportional drive control needed _6 Needed in order to operate power seat functions through joystick control   _7 Display box _8 Allows user to see in which mode and drive the wheelchair is set  _9 necessary for alternate controls    _10 Digital interface electronics _11 Allows w/c to operate when using alternative drive controls  <EPPIRJJOACZYSAYT>_0<\/ZSWFUXNATFTDDUKG>_25 ASL Head Array _13 Allows client to operate wheelchair  through switches placed in tri-panel headrest  _14 Sip and puff with tubing kit _15 needed to operate sip and puff drive controls  <KYHCWCBJSEGBTDVV>_6<\/HYWVPXTGGYIRSWNI>_62 Upgraded tracking electronics _17 increase safety when driving <VOJJKKXFGHWEXHBZ>_1<\/IRCVELFYBOFBPZWC>_58 correct tracking when on uneven surfaces  _19 [facility] for switches or joystick _20 Attaches switches to w/c  _21 Swing away for access or transfers _22 midline for optimal placement _23 provides for consistent access  _24 Attendant controlled joystick plus mount _25 safety _26 long distance driving <NIDPOEUMPNTIRWER>_1<\/VQMGQQPYPPJKDTOI>_71 operation of seat functions _28 compliance with transportation regulations _29                                             Rear wheel placement/Axle adjustability _30 None _31 semi adjustable _32 fully adjustable  _33 improved UE access to wheels _34 improved stability _35 changing angle in space for improvement of postural stability _36 1-arm drive access <IWPYKDXIPJASNKNL>_9<\/JQBHALPFXTKWIOXB>_35 amputee pad placement _38                                 Wheel rims/ hand rims  _39 metal   _40 plastic coated _41 oblique projections           _42 vertical projections _43 Provide ability to propel manual wheelchair  _44  Increase self-propulsion with hand weakness/decreased grasp  Push handles _45 extended   _46 angle adjustable              _47 standard _48 caregiver access _49 caregiver assist _50 allows "hooking" to enable increased ability to perform ADLs or maintain balance  One armed device   _51 Lt   _52 Rt _53 enable propulsion of manual wheelchair with one arm   _54                                            Brake/wheel lock extension _55  Lt   _56  Rt _57 increase indep in applying wheel locks   _58 Side guards _59 prevent clothing getting caught in wheel or becoming soiled _60  prevent skin tears/abrasions  Battery:                                            _61 to power wheelchair                                                         Other:  The above equipment has a life- long use expectancy. Growth and changes in medical and/or functional conditions would be the exceptions. This is to certify that the therapist has no financial relationship with durable medical provider or manufacturer. The therapist will not receive remuneration of any kind for the equipment recommended in this evaluation.   Patient has mobility limitation that significantly impairs safe, timely participation in one or more mobility related ADL's. (bathing, toileting, feeding, dressing, grooming, moving from room to room)  _0  Yes _1  No  Will mobility device sufficiently improve ability to participate and/or be aided in participation of MRADL's?      _2  Yes _3  No  Can limitation be compensated for with use of a cane or walker?                                    _4  Yes _5  No  Does patient or caregiver demonstrate ability/potential ability & willingness to safely use the mobility  device?    _6  Yes _7  No  Does patient's home environment support use of recommended mobility device?            _8  Yes _9  No  Does patient have sufficient upper extremity function necessary to functionally propel a manual wheelchair?     _10  Yes _11  No  Does patient have sufficient strength and trunk stability to safely operate a POV (scooter)?                                  _12  Yes _13  No  Does patient need additional features/benefits provided by a power wheelchair for MRADL's in the home?        _14  Yes _15  No  Does the patient demonstrate the ability to safely use a power wheelchair?     She is willing and motivated to use a power WC              _16  Yes _17  No     Physician's Name Printed:        Tracie Harrier                                                 Physician's Signature:  Date:     This is to certify that I, the above signed therapist have the following affiliations: _18  This DME provider _19  Manufacturer of recommended equipment _20  Patient's long term care facility _21  None of the above  Therapist Name/Signature:      Arelia Sneddon PT DPT                                      Date:11/04/17   Subjective Assessment - 11/04/17 1113    Subjective  Patient had a fall yesterday, she is unsteady, She has difficulty with moving her left leg.     Pertinent History  Hypertension, unspecified type; Hemiparesis due to recent stroke ; Chronic a-fib; Chronic obstructive pulmonary disease,  COPD , Seizure disorder, tobacco use disorder,Type 2 diabetes mellitus with hyperglycemia, type 2 diabetes mellitus with diabetic neuropathy, Chronic diastolic CHF (congestive heart failure) Hx of colonic polyps  How long can you stand comfortably?  1- 5 mins     How long can you walk comfortably?  very limited with RW     Patient Stated Goals  to use a powerchair to get her ADLS and have safe mobility in her home    Currently in Pain?  Yes    Pain Score  7     Pain Location  Leg     Pain Orientation  Left    Pain Descriptors / Indicators  Aching    Pain Type  Acute pain    Pain Radiating Towards  also has chronic pain in LLE from cva    Pain Onset  Yesterday    Pain Frequency  Constant    Aggravating Factors   activity    Pain Relieving Factors  sitting    Effect of Pain on Daily Activities  unable to perform household chores                    Objective measurements completed on examination: See above findings.              PT Education - 11/04/17 1059    Education Details  wc recommendation    Person(s) Educated  Patient    Methods  Explanation    Comprehension  Verbalized understanding          PT Long Term Goals - 11/04/17 1100      PT LONG TERM GOAL #1   Title  Pt and caregivers will understand PT recommendation and appropriate/safe use for wheelchair and seating for home use.    Time  1    Period  Days    Status  Achieved             Plan - 11/04/17 1145    Clinical Impression Statement  Pateint is 72 year old female who presents for evaluation to recieve a powerchair, hover round. she has decreased ROM LUE, decreased BUE strength, decreased BLE strength, decreased static and dynamic standing balance, decreased gait with RW and pain in LLE leg. She has tremors in BUE. She has hx of 2 falls / month ., She would benefit from hover round to improve saftey in her home and be able to be independent in her ADL's .     Clinical Presentation  Evolving    Clinical Presentation due to:  2 falls / week    Clinical Decision Making  Moderate    Rehab Potential  Poor    PT Frequency  One time visit    Consulted and Agree with Plan of Care  Patient       Patient will benefit from skilled therapeutic intervention in order to improve the following deficits and impairments:  Decreased balance, Decreased endurance, Decreased mobility, Difficulty walking, Decreased range of motion, Decreased activity tolerance, Decreased safety  awareness, Decreased strength, Impaired flexibility, Pain, Impaired sensation  Visit Diagnosis: Difficulty in walking, not elsewhere classified  Pain in left leg  Muscle weakness (generalized)  Other abnormalities of gait and mobility     Problem List Patient Active Problem List   Diagnosis Date Noted  . TIA (transient ischemic attack) 09/22/2017  . PAF (paroxysmal atrial fibrillation) ([place])   . Acute CVA (cerebrovascular accident) ([place]) 07/30/2017  . GI bleed 07/08/2017  . Anemia 07/08/2017  . Angiodysplasia of stomach and duodenum with hemorrhage   . CVA (cerebral vascular accident) ([place]) 07/06/2017  . Stroke (cerebrum) ([place])   . Goals of  care, counseling/discussion   . Palliative care encounter   . COPD exacerbation ([place]) 04/03/2017  . Flu 03/27/2017  . Iron deficiency anemia due to chronic blood loss   . Migraine 02/13/2017  . Cataracts, bilateral 01/15/2017  . Chronic venous insufficiency 10/06/2016  . Recurrent major depressive disorder, in partial remission ([place]) 10/06/2016  . Left-sided weakness 10/03/2016  . Chest pain 02/27/2016  . History of stroke 02/19/2016  . Thumb pain, left 01/22/2016  . Traumatic closed nondisplaced fracture of base of metacarpal bone of left thumb 01/22/2016  . Colon cancer screening 11/27/2015  . Depression 11/14/2015  . Coronary artery disease 11/13/2015  . Hypertension 11/13/2015  . History of skin cancer 11/13/2015  . Osteoporosis 11/13/2015  . Chronic low back pain 11/13/2015  . Left medial knee pain 11/13/2015  . Osteoarthritis of multiple joints 11/13/2015  . Other specified hypothyroidism 08/16/2015  . Controlled type 2 diabetes mellitus with diabetic neuropathy ([place]) 08/16/2015  . COPD (chronic obstructive pulmonary disease) ([place]) 08/16/2015  . Tobacco abuse 08/16/2015  . HLD (hyperlipidemia) 08/16/2015  . History of CHF (congestive heart failure) 08/16/2015  . Seizure disorder [facility])     Alanson Puls, [place]  DPT 11/04/2017, 11:50 AM  [address] [facility] SERVICES [place], [place], [address] Phone: [phone]   Fax:  [phone]  Name: Mora Pedraza MRN: [id number] Date of Birth: [birth year]

## 2017-11-06 ENCOUNTER — Other Ambulatory Visit: Payer: Self-pay | Admitting: *Deleted

## 2017-11-06 NOTE — Patient Outreach (Signed)
[place] [facility]) Care Management  11/06/2017  Kaytlynne Neace 07-11-1945 [id number]   Telephone outreach call  Successful outreach call to patient , HIPAA confirmed x 2 identifiers.  Patient discussed not being at home for scheduled home visit on this week.  Patient discussing being at her sister's helping with her care, patient conversation centered on concern regarding her sister medical conditions. Patient reports in attempt to help her sister on this week she loss her balance and had a fall on Monday, reports just being a little sore from that .  Patient discussed contacting PCP office on yesterday due to noting that her bowel movement was dark in color , black , no blood noted today. Patient reports she plans to call office back today to figure out what she needs to do, whether she needs to see GI doctor.  Discussed to patient per office note they have attempted return call to her . Patient denies any abdominal pain .   Patient reports having evaluation for scooter on Thursday and she will qualify , on getting a hoover round.  Patient discussed home health services have completed,discussed that she is thinking about checking into the personal care worker again , discussed the benefits of the services , but she will need to be evaluated by [employer] , states she will discuss further at our visit.    Patient agreeable to rescheduling home visit,   Plan  Will reschedule home visit for the next week  Reinforced fall precautions  Reinforced return call to PCP office regarding dark stools   [name], RN, [place] Management Coordinator  [phone]- Mobile [phone]- [place]

## 2017-11-11 ENCOUNTER — Other Ambulatory Visit: Payer: Self-pay | Admitting: *Deleted

## 2017-11-11 NOTE — Patient Outreach (Addendum)
[place] [facility]) Care Management  11/11/2017  Samantha Aguirre Feb 08, 1945 [id number]    Patient referred by [facility] RNCM to assist with application process for personal care services through [facility]. Patient needs help with her ADL's and is a fall risk. Phone call to patient's provider's office to request that a request form for a independent assessment be completed for personal care services for patient. Form faxed to [phone].   [name] [facility] Care Management [phone]

## 2017-11-11 NOTE — Patient Outreach (Signed)
[place] [facility]) Care Management   11/11/2017  Samantha Aguirre Sep 10, 1945 [id number]  Day Greb is an 72 y.o. female  Subjective:  Patient complained of pain in her left leg/knee,since recent fall, states she has followed up with Dr.Hande since then. Patient states she plans contact MD if doesn't get better and ask about seeing a orthopedic MD.   Patient reports dark stools on last week,has cleared up and has followed up with GI MD ,will  reschedule appointment after cardiac study for possible watchman procedure. Patient dicussed that she is no longer going to [facility] regarding watchman , she has spoken with her cardiologist [facility] regarding visit .    Objective:  BP 140/70 (BP Location: Left Arm, Patient Position: Sitting, Cuff Size: Normal)   Pulse 72   Resp 18   Wt 166 lb (75.3 kg)   SpO2 98%   BMI 31.37 kg/m  Review of Systems  Constitutional: Negative.   HENT: Negative.   Eyes: Negative.   Respiratory: Negative.   Cardiovascular: Negative.   Gastrointestinal: Negative.   Genitourinary: Negative.   Musculoskeletal: Positive for joint pain.  Skin: Negative.   Neurological: Negative.   Endo/Heme/Allergies: Negative.   Psychiatric/Behavioral: Negative.     Physical Exam  Constitutional: She is oriented to person, place, and time. She appears well-developed and well-nourished.  Cardiovascular: Normal rate and normal heart sounds.  Respiratory: Effort normal and breath sounds normal.  GI: Soft. Bowel sounds are normal.  Neurological: She is alert and oriented to person, place, and time.  Skin: Skin is warm and dry.  Psychiatric: She has a normal mood and affect. Her behavior is normal. Judgment and thought content normal.    Encounter Medications:   Outpatient Encounter Medications as of 11/11/2017  Medication Sig  . albuterol (PROVENTIL HFA;VENTOLIN HFA) 108 (90 Base) MCG/ACT inhaler Inhale 2 puffs into the lungs every 6 (six) hours as needed  for wheezing or shortness of breath.  Marland Kitchen aspirin 325 MG tablet Take 1 tablet (325 mg total) by mouth daily.  Marland Kitchen atorvastatin (LIPITOR) 80 MG tablet Take 1 tablet (80 mg total) by mouth daily at 6 PM.  . blood glucose meter kit and supplies Dispense based on patient and insurance preference. Use up to four times daily as directed. (FOR ICD-10 E10.9, E11.9). (Patient not taking: Reported on 09/28/2017)  . colestipol (COLESTID) 1 g tablet Take 2 g by mouth 2 (two) times daily.  . diclofenac sodium (VOLTAREN) 1 % GEL Apply 2 g topically 4 (four) times daily.  . DULoxetine (CYMBALTA) 60 MG capsule Take 1 capsule (60 mg total) by mouth at bedtime.  . feeding supplement, GLUCERNA SHAKE, (GLUCERNA SHAKE) LIQD Take 237 mLs by mouth 2 (two) times daily between meals. (Patient not taking: Reported on 10/22/2017)  . ferrous sulfate 325 (65 FE) MG EC tablet Take 1 tablet (325 mg total) by mouth 2 (two) times daily with a meal. (Patient taking differently: Take 325 mg by mouth daily. )  . fluticasone (FLONASE) 50 MCG/ACT nasal spray Place 1 spray into both nostrils daily.  Marland Kitchen gabapentin (NEURONTIN) 800 MG tablet Take 1 tablet (800 mg total) by mouth 2 (two) times daily.  Marland Kitchen glipiZIDE-metformin (METAGLIP) 5-500 MG tablet Take 1 tablet by mouth 2 (two) times daily before a meal. (Patient taking differently: Take 1-2 tablets by mouth See admin instructions. Take 2 tablets by mouth every morning and 1 tablet by mouth every night)  . levETIRAcetam (KEPPRA) 500 MG tablet Take 1 tablet (500  mg total) by mouth 2 (two) times daily.  Marland Kitchen levothyroxine (SYNTHROID, LEVOTHROID) 112 MCG tablet Take 1 tablet (112 mcg total) by mouth daily before breakfast.  . nitroGLYCERIN (NITROSTAT) 0.4 MG SL tablet Place 1 tablet (0.4 mg total) under the tongue every 5 (five) minutes as needed for chest pain.  Marland Kitchen omeprazole (PRILOSEC) 20 MG capsule Take 20 mg by mouth 2 (two) times daily.  . pantoprazole (PROTONIX) 40 MG tablet Take 1 tablet (40 mg  total) by mouth 2 (two) times daily.  . potassium chloride SA (K-DUR,KLOR-CON) 20 MEQ tablet Take 20 mEq by mouth 2 (two) times daily.  . sucralfate (CARAFATE) 1 g tablet TAKE (1) TABLET BY MOUTH FOUR TIMES A DAY (Patient taking differently: Take 1 g by mouth 4 (four) times daily. )  . topiramate (TOPAMAX) 25 MG tablet Take 25 mg by mouth 2 (two) times daily.  Marland Kitchen umeclidinium bromide (INCRUSE ELLIPTA) 62.5 MCG/INH AEPB Inhale 1 puff into the lungs daily. (Patient not taking: Reported on 10/20/2017)  . vitamin B-12 (CYANOCOBALAMIN) 1000 MCG tablet Take 1 tablet by mouth daily.   No facility-administered encounter medications on file as of 11/11/2017.     Functional Status:   In your present state of health, do you have any difficulty performing the following activities: 10/20/2017 09/28/2017  Hearing? Y N  Vision? N N  Difficulty concentrating or making decisions? N N  Walking or climbing stairs? N Y  Dressing or bathing? N N  Doing errands, shopping? N Y  Comment - uses medicaid transportation , walker,  Conservation officer, nature and eating ? - N  Comment - -  Using the Toilet? - N  In the past six months, have you accidently leaked urine? - Y  Do you have problems with loss of bowel control? - N  Managing your Medications? - N  Managing your Finances? - N  Housekeeping or managing your Housekeeping? - Y  Comment - family assist at times  Some recent data might be hidden    Fall/Depression Screening:    Fall Risk  07/21/2017 05/19/2017 03/10/2017  Falls in the past year? Yes Yes Yes  Number falls in past yr: 2 or more 2 or more 2 or more  Injury with Fall? Yes Yes Yes  Comment - - -  Risk Factor Category  High Fall Risk High Fall Risk High Fall Risk  Risk for fall due to : Impaired balance/gait Impaired balance/gait Impaired balance/gait  Risk for fall due to: Comment - - recently got a cane   Follow up Falls prevention discussed - Falls prevention discussed   PHQ 2/9 Scores 07/21/2017 05/19/2017  03/10/2017 11/24/2016 10/06/2016 11/13/2015  PHQ - 2 Score 0 0 2 0 2 4  PHQ- 9 Score - - 12 - 14 15    Assessment:  Routine home visit   TIA No symptoms when reviewed, states she knows when to call for help and all the symptoms of stroke/TIA GI symptoms  Solid bowel movements ,denies dark stools or seeing blood, reinforced importance of notifying MD of new concerns  Fall risk  Not using walker during visit, reports she uses it mostly when outside home. Patient discussed being afraid to take a shower alone. She is agreeable to accept assistance with pursuing personal care assistance. Patient reports she is awaiting delivery of scooter since she has completed testing.  Hypertension/Chronic Atrial fib  Has follow up visit with Dr.[place] regarding watchman device. Patient reports that she uses [employer] and [employer] to  foods. Patient states that her blood pressure monitor is no longer working and she can't afford a new one.   Patient reports taking all oral medications as prescribed, she discussed recent prescription for lisinopril  Diabetes Patient discussed recent A1c was 7 on 9/30 , and was 5.9 in July. Patient does not have monitor to check blood sugar.  Patient reports last eye exam was 11/19/16 at [facility] in [place] , plans to reschedule next visit at a different center, [facility]. Discussed importance of annual eye exam.  Advanced directive Has DNR paperwork posted in home .Marland Kitchen Patient does not have a HCPOA, and interesting in obtaining. Explained the basics of completing paperwork, and resources for getting notarized  COPD. Still smoker declined education,states that she is  not going to stop,  Not using prescribed inhaler, has rescue inhaler,reports that she has had to use recently.   Plan  Will plan follow up call in the next 2 weeks . Will place social worker consult for assistance with personal care service evaluation as benefit of her medicaid. Provided Advanced directive  paperwork and explanation on completion .  Prepared EMMI handouts on TIA/Stroke , and fall prevention patient declined need for further education.  Placed call to [place] office spoke with April regarding patient request for blood sugar monitor.  Will notify [place] assistant regarding patient need for blood pressure monitor, unable to afford.  Fall prevention measure reviewed encouraged to use walker .  Will send PCP visit note     [facility] CM Care Plan Problem One     Most Recent Value  Care Plan Problem One  Recent hospital readmission related to TIA   Role Documenting the Problem One  Care Management [place] for Problem One  Active  THN Long Term Goal   Patient will not experience a hospital admission in the next 31  days   THN Long Term Goal Start Date  10/22/17  Interventions for Problem One Long Term Goal  Advised regarding notifying MD of any new concerns regarding dark stools, reviewed signs of bleeding and why important to notify MD. Encouraged notify MD if worsening of knee discomfort.   THN CM Short Term Goal #1   Patient will attend all medical appointments over the next 30 days   THN CM Short Term Goal #1 Start Date  10/22/17  Interventions for Short Term Goal #1  Discussed recent visits and upcoming cardiologist  visit , encouraged attending all visit and if she has to cancel , be sure to reschedule .   THN CM Short Term Goal #2   Patient will be able to have walker, scooter in place over the next 30 days   THN CM Short Term Goal #2 Start Date  10/22/17  Interventions for Short Term Goal #2  Discussed progress on getting scooter being delivered .   THN CM Short Term Goal #3  Patient will be able to report at least 3 symptoms of CVA/TIA and action plan   [facility] CM Short Term Goal #3 Start Date  10/22/17  [facility] CM Short Term Goal #3 Met Date  11/06/17      [name], RN, [place] Management Coordinator  [phone]  Mobile [phone]- Toll Free Main Office

## 2017-11-13 ENCOUNTER — Other Ambulatory Visit: Payer: Self-pay | Admitting: Family Medicine

## 2017-11-13 DIAGNOSIS — J410 Simple chronic bronchitis: Secondary | ICD-10-CM

## 2017-11-13 DIAGNOSIS — G8929 Other chronic pain: Secondary | ICD-10-CM

## 2017-11-13 DIAGNOSIS — M545 Low back pain: Secondary | ICD-10-CM

## 2017-11-13 DIAGNOSIS — G43919 Migraine, unspecified, intractable, without status migrainosus: Secondary | ICD-10-CM

## 2017-11-13 DIAGNOSIS — J449 Chronic obstructive pulmonary disease, unspecified: Secondary | ICD-10-CM

## 2017-11-17 DIAGNOSIS — Z720 Tobacco use: Secondary | ICD-10-CM | POA: Diagnosis not present

## 2017-11-17 DIAGNOSIS — I2510 Atherosclerotic heart disease of native coronary artery without angina pectoris: Secondary | ICD-10-CM | POA: Diagnosis not present

## 2017-11-17 DIAGNOSIS — G40909 Epilepsy, unspecified, not intractable, without status epilepticus: Secondary | ICD-10-CM | POA: Diagnosis not present

## 2017-11-17 DIAGNOSIS — E1140 Type 2 diabetes mellitus with diabetic neuropathy, unspecified: Secondary | ICD-10-CM | POA: Diagnosis not present

## 2017-11-17 DIAGNOSIS — J449 Chronic obstructive pulmonary disease, unspecified: Secondary | ICD-10-CM | POA: Diagnosis not present

## 2017-11-17 DIAGNOSIS — E782 Mixed hyperlipidemia: Secondary | ICD-10-CM | POA: Diagnosis not present

## 2017-11-17 DIAGNOSIS — I10 Essential (primary) hypertension: Secondary | ICD-10-CM | POA: Diagnosis not present

## 2017-11-17 DIAGNOSIS — I219 Acute myocardial infarction, unspecified: Secondary | ICD-10-CM | POA: Diagnosis not present

## 2017-11-17 DIAGNOSIS — E785 Hyperlipidemia, unspecified: Secondary | ICD-10-CM | POA: Diagnosis not present

## 2017-11-17 DIAGNOSIS — I69392 Facial weakness following cerebral infarction: Secondary | ICD-10-CM | POA: Diagnosis not present

## 2017-11-17 DIAGNOSIS — I482 Chronic atrial fibrillation, unspecified: Secondary | ICD-10-CM | POA: Diagnosis not present

## 2017-11-20 ENCOUNTER — Encounter: Payer: Self-pay | Admitting: *Deleted

## 2017-11-20 NOTE — Patient Outreach (Signed)
[place] [facility]) Care Management  11/20/2017  Samantha Aguirre 1945-11-29 [id number]   Completed request for Independent Assessment for personal care services completed by patient's provider's office received and faxed today to [name] for processing.   [name] [facility] Care Management [phone]

## 2017-11-25 ENCOUNTER — Other Ambulatory Visit: Payer: Self-pay | Admitting: *Deleted

## 2017-11-25 NOTE — Patient Outreach (Signed)
[place] [facility]) Care Management  11/25/2017  Ragina Fenter 05-27-1945 [id number]   Phone call to patient to inform her that the request form for an assessment for personal care services has been faxed to [facility].  This Education officer, museum will follow up within 2 weeks regarding the status of the request.   [name] [facility] Care Management [phone]

## 2017-11-26 ENCOUNTER — Other Ambulatory Visit: Payer: Self-pay | Admitting: *Deleted

## 2017-11-26 NOTE — Patient Outreach (Signed)
[place] [facility]) Care Management  11/26/2017  Nevena Rozenberg Jul 19, 1945 [id number]   Telephone assessment    Patient discussed having recent visit to cardiology regarding follow up on watchman procedure.  Patient discussed having an appointment at [facility] on 11/26 with cardiologist Dr.Thomas. Patient states that she has called medicaid transportation and states they can't do it , she is unsure of the reason but will call them again.    Patient further discussed:  Diabetes Patient reports that she has not been able to get CBG [facility] from [facility] due needing further  Authorization per patient . I placed call to [facility] to follow up on prescription they state , awaiting diagnosis code on hard copy and specific instructions information needed before they can fill.  Patient reports that she will get her dentures on next week.   TIA/CVA No new symptoms when reviewed. Discussed patient follow through on neurology appointment that was cancelled due to being in the hospital again per patient . Patient states that she will plan reschedule appointments.   Fall Risk  No new falls, patient continue to complain of knee pain and has discussed with PCP possible referral to orthopedic MD. Reinforced fall precautions using walker. Patient awaiting scooter .     Plan  Call to [place] office ,spoke with Sharyn Lull to discuss patient need for CBG meter and [facility] needing diagnosis code and specific instructions on prescriptions before they can fill it.  Will plan follow up call in the next 2 weeks regarding glucose meter and scheduling neurology, follow up on transportation for cardiology visit in [facility].  Will discuss with [facility] social worker regarding medicaid transportation to [place]. Fall precautions reinforced.   [facility] CM Care Plan Problem One     Most Recent Value  Care Plan Problem One  Recent hospital readmission related to TIA   (Pended)   Role Documenting the Problem One  Care  Management Coordinator  (Pended)   [place] for Problem One  Active  (Pended)   THN Long Term Goal   Patient will not experience a hospital admission in the next 31  days   (Pended)   [place] Term Goal Start Date  10/22/17  (Pended)   [place] CM Short Term Goal #1   Patient will attend all medical appointments over the next 30 days   (Pended)   THN CM Short Term Goal #1 Start Date  10/22/17  (Pended)   THN CM Short Term Goal #2   Patient will be able to have walker, scooter in place over the next 30 days   (Pended)   THN CM Short Term Goal #2 Start Date  10/22/17  (Pended)   THN CM Short Term Goal #3  Patient will be able to report at least 3 symptoms of CVA/TIA and action plan   (Pended)   THN CM Short Term Goal #3 Start Date  10/22/17  (Pended)   [facility] CM Short Term Goal #3 Met Date  11/06/17  (Pended)       [name], RN, [place] Management Coordinator  [phone] Mobile [phone]- [place]

## 2017-11-27 ENCOUNTER — Other Ambulatory Visit: Payer: Self-pay | Admitting: *Deleted

## 2017-11-27 NOTE — Patient Outreach (Signed)
[place] [facility]) Care Management  11/27/2017  Samantha Aguirre 1945/08/08 [id number]   Phone call to patient to follow up on personal care services. Conference call to [facility]. It was confirmed that the referral has been received and they are ready to schedule her assessment. Voicemail message was left for the scheduler.  Patient also has an appointment for the Watchman device at [facility]. Patient does have medicaid, however this clinic is outside of their coverage area. They would need special authorization regarding medical necessity from patient's doctor.  They will mail a consent form for patient to sign to speak to patient's doctor on Monday. Patient is aware of this and will look for consent form in the mail.   Plan: This Education officer, museum will follow up with patient within 2 weeks regarding personal care services and transportation to her medical appointment.   [name] [facility] Care Management [phone]

## 2017-11-28 ENCOUNTER — Other Ambulatory Visit: Payer: Self-pay

## 2017-11-28 ENCOUNTER — Encounter: Payer: Self-pay | Admitting: Emergency Medicine

## 2017-11-28 ENCOUNTER — Emergency Department
Admission: EM | Admit: 2017-11-28 | Discharge: 2017-11-28 | Disposition: A | Discharge status: Home / Self Care | Payer: Medicare Other | Attending: Emergency Medicine | Admitting: Emergency Medicine

## 2017-11-28 ENCOUNTER — Emergency Department: Payer: Medicare Other

## 2017-11-28 DIAGNOSIS — N939 Abnormal uterine and vaginal bleeding, unspecified: Secondary | ICD-10-CM | POA: Diagnosis not present

## 2017-11-28 DIAGNOSIS — N938 Other specified abnormal uterine and vaginal bleeding: Secondary | ICD-10-CM | POA: Diagnosis present

## 2017-11-28 DIAGNOSIS — Z8673 Personal history of transient ischemic attack (TIA), and cerebral infarction without residual deficits: Secondary | ICD-10-CM | POA: Insufficient documentation

## 2017-11-28 DIAGNOSIS — F17210 Nicotine dependence, cigarettes, uncomplicated: Secondary | ICD-10-CM | POA: Insufficient documentation

## 2017-11-28 DIAGNOSIS — Z85828 Personal history of other malignant neoplasm of skin: Secondary | ICD-10-CM | POA: Insufficient documentation

## 2017-11-28 DIAGNOSIS — R319 Hematuria, unspecified: Secondary | ICD-10-CM | POA: Insufficient documentation

## 2017-11-28 DIAGNOSIS — I2510 Atherosclerotic heart disease of native coronary artery without angina pectoris: Secondary | ICD-10-CM | POA: Insufficient documentation

## 2017-11-28 DIAGNOSIS — E1140 Type 2 diabetes mellitus with diabetic neuropathy, unspecified: Secondary | ICD-10-CM | POA: Insufficient documentation

## 2017-11-28 DIAGNOSIS — I5032 Chronic diastolic (congestive) heart failure: Secondary | ICD-10-CM | POA: Insufficient documentation

## 2017-11-28 DIAGNOSIS — I110 Hypertensive heart disease with heart failure: Secondary | ICD-10-CM | POA: Diagnosis not present

## 2017-11-28 DIAGNOSIS — J449 Chronic obstructive pulmonary disease, unspecified: Secondary | ICD-10-CM | POA: Insufficient documentation

## 2017-11-28 LAB — URINALYSIS, COMPLETE (UACMP) WITH MICROSCOPIC
Bilirubin Urine: NEGATIVE
Glucose, UA: NEGATIVE mg/dL
KETONES UR: NEGATIVE mg/dL
Nitrite: NEGATIVE
PH: 5 (ref 5.0–8.0)
Protein, ur: NEGATIVE mg/dL
Specific Gravity, Urine: 1.02 (ref 1.005–1.030)

## 2017-11-28 LAB — CBC
HCT: 42.8 % (ref 36.0–46.0)
HEMOGLOBIN: 13.8 g/dL (ref 12.0–15.0)
MCH: 27.9 pg (ref 26.0–34.0)
MCHC: 32.2 g/dL (ref 30.0–36.0)
MCV: 86.5 fL (ref 80.0–100.0)
Platelets: 257 10*3/uL (ref 150–400)
RBC: 4.95 MIL/uL (ref 3.87–5.11)
RDW: 15.4 % (ref 11.5–15.5)
WBC: 8.3 10*3/uL (ref 4.0–10.5)
nRBC: 0 % (ref 0.0–0.2)

## 2017-11-28 LAB — BASIC METABOLIC PANEL
Anion gap: 13 (ref 5–15)
BUN: 9 mg/dL (ref 8–23)
CHLORIDE: 106 mmol/L (ref 98–111)
CO2: 22 mmol/L (ref 22–32)
Calcium: 9.1 mg/dL (ref 8.9–10.3)
Creatinine, Ser: 0.65 mg/dL (ref 0.44–1.00)
GFR calc non Af Amer: >60 mL/min (ref >60)
Glucose, Bld: 110 mg/dL — ABNORMAL HIGH (ref 70–99)
Potassium: 3 mmol/L — ABNORMAL LOW (ref 3.5–5.1)
SODIUM: 141 mmol/L (ref 135–145)

## 2017-11-28 MED ORDER — CIPROFLOXACIN HCL 500 MG PO TABS
500.0000 mg | ORAL_TABLET | Freq: Once | ORAL | Status: AC
  Administered 2017-11-28: 500 mg via ORAL
  Filled 2017-11-28: qty 1

## 2017-11-28 MED ORDER — CIPROFLOXACIN HCL 500 MG PO TABS: 500.0000 mg | ORAL_TABLET | Freq: Two times a day (BID) | ORAL | 0 refills | Status: DC

## 2017-11-28 MED ORDER — CIPROFLOXACIN HCL 500 MG PO TABS: 500.0000 mg | ORAL_TABLET | Freq: Two times a day (BID) | ORAL | 0 refills | Status: AC

## 2017-11-28 NOTE — ED Triage Notes (Signed)
Pt c/o vaginal bleeding.  No blood in panties but reports every time she urinates and wipes there is blood on the tissue.  Hx hysterectomy.  Pt denies blood in urine or blood in tissue from urine.  No hx same.  Started today. Feels bloated per pt. NAD, VSS.

## 2017-11-28 NOTE — ED Provider Notes (Signed)
[facility] Emergency Department Provider Note       Time seen: ----------------------------------------- 8:36 PM on 11/28/2017 -----------------------------------------   I have reviewed the triage vital signs and the nursing notes.  HISTORY   Chief Complaint Vaginal Bleeding    HPI Samantha Aguirre is a 72 y.o. female with a history of GERD, asthma, atrial fibrillation, cancer, CHF, COPD, diabetes, GI bleed, hypertension who presents to the ED for hematuria.  Patient reports a complete hysterectomy about 30 years ago, has had blood in her underwear and every time she urinates and wipes there is blood on the tissue.  She has never had this happen before.  She denies any dysuria but has had polyuria.  Past Medical History:  Diagnosis Date  . Acid reflux   . Arthritis    Bilateral knees, hands, feet  . Asthma   . Atrial fibrillation ([place])   . Cancer ([place])   . CHF (congestive heart failure) (HCC)    Diastolic CHF  . COPD (chronic obstructive pulmonary disease) ([place])   . Coronary artery disease   . Diabetes mellitus without complication ([place])   . Frequent headaches   . GI bleed   . HLD (hyperlipidemia)   . Hypertension   . Seizures ([place])   . Skin cancer 2015   Suspected basal cell carcinoma on nose, surgically removed  . Stroke ([place]) 04/2017  . Thyroid disease   . Tremors of nervous system     Patient Active Problem List   Diagnosis Date Noted  . TIA (transient ischemic attack) 09/22/2017  . PAF (paroxysmal atrial fibrillation) ([place])   . Acute CVA (cerebrovascular accident) ([place]) 07/30/2017  . GI bleed 07/08/2017  . Anemia 07/08/2017  . Angiodysplasia of stomach and duodenum with hemorrhage   . CVA (cerebral vascular accident) ([place]) 07/06/2017  . Stroke (cerebrum) ([place])   . Goals of care, counseling/discussion   . Palliative care encounter   . COPD exacerbation ([place]) 04/03/2017  . Flu 03/27/2017  . Iron deficiency anemia due to chronic  blood loss   . Migraine 02/13/2017  . Cataracts, bilateral 01/15/2017  . Chronic venous insufficiency 10/06/2016  . Recurrent major depressive disorder, in partial remission ([place]) 10/06/2016  . Left-sided weakness 10/03/2016  . Chest pain 02/27/2016  . History of stroke 02/19/2016  . Thumb pain, left 01/22/2016  . Traumatic closed nondisplaced fracture of base of metacarpal bone of left thumb 01/22/2016  . Colon cancer screening 11/27/2015  . Depression 11/14/2015  . Coronary artery disease 11/13/2015  . Hypertension 11/13/2015  . History of skin cancer 11/13/2015  . Osteoporosis 11/13/2015  . Chronic low back pain 11/13/2015  . Left medial knee pain 11/13/2015  . Osteoarthritis of multiple joints 11/13/2015  . Other specified hypothyroidism 08/16/2015  . Controlled type 2 diabetes mellitus with diabetic neuropathy ([place]) 08/16/2015  . COPD (chronic obstructive pulmonary disease) ([place]) 08/16/2015  . Tobacco abuse 08/16/2015  . HLD (hyperlipidemia) 08/16/2015  . History of CHF (congestive heart failure) 08/16/2015  . Seizure disorder [facility])     Past Surgical History:  Procedure Laterality Date  . ABDOMINAL HYSTERECTOMY  1976  . APPENDECTOMY  1980  . CAROTID ENDARTERECTOMY    . CHOLECYSTECTOMY  1980  . COLONOSCOPY N/A 02/20/2017   Procedure: COLONOSCOPY;  Surgeon: Lin Landsman, MD;  Location: [facility] ENDOSCOPY;  Service: Gastroenterology;  Laterality: N/A;  . ESOPHAGOGASTRODUODENOSCOPY N/A 02/20/2017   Procedure: ESOPHAGOGASTRODUODENOSCOPY (EGD);  Surgeon: Lin Landsman, MD;  Location: [facility] ENDOSCOPY;  Service: Gastroenterology;  Laterality: N/A;  . ESOPHAGOGASTRODUODENOSCOPY (EGD) WITH PROPOFOL N/A 07/08/2017   Procedure: ESOPHAGOGASTRODUODENOSCOPY (EGD) WITH PROPOFOL;  Surgeon: [name], MD;  Location: [facility] ENDOSCOPY;  Service: Endoscopy;  Laterality: N/A;  . [place]  . GIVENS CAPSULE STUDY N/A 07/09/2017   Procedure: GIVENS CAPSULE STUDY;  Surgeon:  Jonathon Bellows, MD;  Location[facility] ENDOSCOPY;  Service: Gastroenterology;  Laterality: N/A;  . KNEE SURGERY     left  . ROTATOR CUFF REPAIR     right  . TONSILLECTOMY  1950    Allergies Methotrexate; Mushroom extract complex; Atenolol; Ivp dye [iodinated diagnostic agents]; Morphine and related; Oxycodone hcl; Percocet [oxycodone-acetaminophen]; Percodan [oxycodone-aspirin]; Tramadol; and Verapamil  Social History Social History   Tobacco Use  . Smoking status: Current Every Day Smoker    Packs/day: 1.00    Years: 53.00    Pack years: 53.00    Types: Cigarettes  . Smokeless tobacco: Never Used  Substance Use Topics  . Alcohol use: No  . Drug use: No   Review of Systems Constitutional: Negative for fever. Cardiovascular: Negative for chest pain. Respiratory: Negative for shortness of breath. Gastrointestinal: Negative for abdominal pain, vomiting and diarrhea. Genitourinary: Positive for hematuria Musculoskeletal: Negative for back pain. Skin: Negative for rash. Neurological: Negative for headaches, focal weakness or numbness.  All systems negative/normal/unremarkable except as stated in the HPI  ____________________________________________   PHYSICAL EXAM:  VITAL SIGNS: ED Triage Vitals  Enc Vitals Group     BP 11/28/17 1708 (!) 117/59     Pulse Rate 11/28/17 1708 (!) 58     Resp 11/28/17 1708 16     Temp 11/28/17 1707 97.7 F (36.5 C)     Temp Source 11/28/17 1707 Oral     SpO2 11/28/17 1708 99 %     Weight 11/28/17 1708 174 lb (78.9 kg)     Height 11/28/17 1708 5\' 1"  (1.549 m)     Head Circumference --      Peak Flow --      Pain Score 11/28/17 1708 1     Pain Loc --      Pain Edu? --      Excl. in [place]? --    Constitutional: Alert and oriented. Well appearing and in no distress. Eyes: Conjunctivae are normal. Normal extraocular movements. ENT   Head: Normocephalic and atraumatic.   Nose: No congestion/rhinnorhea.   Mouth/Throat: Mucous  membranes are moist.   Neck: No stridor. Cardiovascular: Normal rate, regular rhythm. No murmurs, rubs, or gallops. Respiratory: Normal respiratory effort without tachypnea nor retractions. Breath sounds are clear and equal bilaterally. No wheezes/rales/rhonchi. Gastrointestinal: Soft and nontender. Normal bowel sounds Musculoskeletal: Nontender with normal range of motion in extremities. No lower extremity tenderness nor edema. Neurologic:  Normal speech and language. No gross focal neurologic deficits are appreciated.  Skin:  Skin is warm, dry and intact. No rash noted. Psychiatric: Mood and affect are normal. Speech and behavior are normal.  ____________________________________________  ED COURSE:  As part of my medical decision making, I reviewed the following data within the [place] History obtained from family if available, nursing notes, old chart and ekg, as well as notes from prior ED visits. Patient presented for hematuria, we will assess with labs and imaging as indicated at this time.   Procedures ____________________________________________   LABS (pertinent positives/negatives)  Labs Reviewed  BASIC METABOLIC PANEL - Abnormal; Notable for the following components:      Result Value   Potassium  3.0 (*)    Glucose, Bld 110 (*)    All other components within normal limits  URINALYSIS, COMPLETE (UACMP) WITH MICROSCOPIC - Abnormal; Notable for the following components:   Color, Urine YELLOW (*)    APPearance HAZY (*)    Hgb urine dipstick LARGE (*)    Leukocytes, UA SMALL (*)    RBC / HPF >50 (*)    Bacteria, UA RARE (*)    All other components within normal limits  URINE CULTURE  CBC    RADIOLOGY Images were viewed by me  Abdomen 2 view Was unremarkable ____________________________________________  DIFFERENTIAL DIAGNOSIS   UTI, bladder cancer, renal colic, coagulopathy  FINAL ASSESSMENT AND PLAN  Hematuria   Plan: The patient  had presented for hematuria. Patient's labs did reveal hematuria with possible evidence of urinary tract infection as well.  We have added on a urine culture.  Patient's imaging reveal any acute process.  She has had a negative CT of her abdomen in the last 6 months.  She will be referred to urology for outpatient follow-up, she will be placed on Cipro to cover for infection.   Laurence Aly, MD   Note: This note was generated in part or whole with voice recognition software. Voice recognition is usually quite accurate but there are transcription errors that can and very often do occur. I apologize for any typographical errors that were not detected and corrected.     Earleen Newport, MD 11/28/17 2114

## 2017-11-30 ENCOUNTER — Other Ambulatory Visit: Payer: Self-pay

## 2017-11-30 LAB — URINE CULTURE
CULTURE: NO GROWTH
Special Requests: NORMAL

## 2017-11-30 NOTE — Patient Outreach (Signed)
[place] [facility]) Care Management  11/30/2017  Samantha Aguirre 1945/07/31 [id number]  BSW received in basket message from [place], [place] requesting transportation be arranged for upcomming appointment on 11/26. BSW sent transportation request to [place], BSW, [place] Management Social Worker [phone]

## 2017-12-01 ENCOUNTER — Other Ambulatory Visit: Payer: Self-pay | Admitting: *Deleted

## 2017-12-01 NOTE — Patient Outreach (Signed)
[place] [facility]) Care Management  12/01/2017  Samantha Aguirre 28-Aug-1945 [id number]   Phone call to patient to follow up on appointment being scheduled for the assessment for personal care services. Per patient, she has not heard from [employer]. Per patient she was recently in the ED for a UTI. She was told by her doctor to cancel the appointment at [facility] for the [place] to be scheduled at a later date. Transportation for this appointment to be cancelled. Daneen Schick, BSW notified to cancel transportation appointment.  Plan: This Education officer, museum to follow up with patient regarding assessment being scheduled through [employer].   [name] [facility] Care Management [phone]

## 2017-12-02 ENCOUNTER — Other Ambulatory Visit: Payer: Self-pay | Admitting: *Deleted

## 2017-12-02 NOTE — Patient Outreach (Signed)
[place] [facility]) Care Management  12/02/2017  Samantha Aguirre 16-Jan-1945 [id number]   Telephone assessment   Fidela Cieslak an 72 y.o.female Recent admission to [facility] 108-10/9 Observation , Dx: TIA.  PMH includes but not limited to : Diabetes , COPD, Left side weakness, stroke, GI bleed Recent admissions : 9/10- 9/12, with TIA ED visit 11/16 Hematuria, UTI  Successful outreach call to patient . She discussed her recent visit to ED with bleeding noted with peeing. Patient shared that she has not started antibiotic, due to problem with prescription, she has notified Dr.Hande he office re faxed new prescription, and he has sent it to [facility] . Patient anticipates delivery of antibiotic on today.  She discussed that she has also been advised by his office to stop aspirin for now. Patient reports that urine still has redness but lightened up. She reports PCP states to follow up with him after 10 day course of antibiotics.  Reinforced with patient worsening or unresolved symptoms of UTI to notify MD office sooner and arrange a office visit.   HX of CVA and TIA No new symptoms , patient reports that she has cancelled appointment with Dr.Thomas regarding referral for watchman device, due to recent ED visit for UTI and having follow up on that.  Patient states Dr.Thomas office informed that she would have to go through [facility] office for another referral.  Discussed with patient importance of follow up, with history of CVA. Again encouraged patient regarding follow up with neurology appointment to reschedule visit.   Diabetes  Patient states that she has not received CBG meter yet.  Call to [facility] to follow up , representative states that they are waiting on patient medicare part B number, they needed her number from card. l  Patient placed call to [facility], she returns call to me and states  informed by [facility] that she can't get number of strips on prescription ( 100)   because she does not take insulin ( prescription reads check 3 times a day)   Fall risk  No falls since last visit, patient following up with PCP regarding orders for scooter through [employer] instead of [facility] due to payment .   Plan  Will discuss concerns with getting meter with [facility] pharmacist and follow up with PCP as needed.  Discussed with patient why its importance of follow up with all medical appointment, due to history of CVA/TIA .  Will plan return call to patient within the next 2 weeks.    [name], RN, [place] Management Coordinator  [phone]- Mobile [phone]- Toll Free Main Office

## 2017-12-07 ENCOUNTER — Other Ambulatory Visit: Payer: Self-pay | Admitting: *Deleted

## 2017-12-07 NOTE — Patient Outreach (Addendum)
[place] [facility]) Care Management  12/07/2017  Samantha Aguirre Aug 24, 1945 [id number]  Incoming call  Received voicemail message from patient stating that she needs a pharmacy that will delivery her medications, she is frustrated with [facility] , it took 5 days to deliver recently ordered antibiotic. Patient currently gets prescriptions pill packaged [facility] to [facility] from [facility].   Patient reports just opened new box packaged medication on today.   Patient reports now that she is on antibiotic her urine is clearing up ,and she is feeling better.    Plan  Will plan return call to patient in the next 2 weeks.  Reinforced completing full prescription and review of worsening symptoms to notify MD of .  Will place pharmacy consult for home delivery pharmacy that does pill packaging .   [name], RN, [place] Management Coordinator  [phone]- Mobile [phone]- Toll Free Main Office

## 2017-12-07 NOTE — Patient Outreach (Signed)
[place] [facility]) Care Management  12/07/2017  Shatika Grinnell [birth year] [id number]   Phone call to patient to follow up on personal care services. Per patient, she has scheduled her in home assessment for personal care services through [facility] on 12/09/17 at 2:30pm.  Conference call made to [facility] care confirmed this time and date. Patient states that she has started her antibiotic but would like more information on a pharmacy that delivers. Using current pharmacy, her antibiotic was delayed.  Plan: This Education officer, museum will inform patient's RNCM Landis Martins of patient's plan to change pharmacies. This Education officer, museum will follow up with patient within 2 weeks to follow up on assessment for in home care.  [name] [facility] Care Management [phone]

## 2017-12-09 ENCOUNTER — Other Ambulatory Visit: Payer: Self-pay | Admitting: Pharmacist

## 2017-12-09 NOTE — Patient Outreach (Signed)
[place] [facility]) Care Management  12/09/2017  Terrina Docter 1945/07/13 [id number]  Received call and voicemail from patient. Attempted to call back, but the call went straight to voicemail and voicemail has not been set up, I could not leave a message.   Will attempt outreach again in 3-5 business days  Catie Darnelle Maffucci, PharmD PGY2 Ambulatory Care Pharmacy Resident, [place] Phone: [phone]

## 2017-12-09 NOTE — Patient Outreach (Signed)
[place] [facility]) Care Management  12/09/2017  Bobie Caris [birth year] [id number]  Received referral for medication assistance for this patient, as she wants to transfer prescriptions from [facility] mail order pharmacy to another pharmacy that offers pill packaging and delivery.   Contacted patient; was unable to leave a voicemail as her voicemail was not set up.   Called her emergency contact, Daune Divirgilio (daughter); HIPAA verifiers identified. I explained the purpose of my call and that I was trying to get in touch with her mother. We discussed that [facility] in [place], [place] would be the closest local pharmacy to her mother that offers pill packaging and home delivery. Contacted [facility], packaging is free and delivery is free to locations within a 3 mile radius from the store, which Ms. Steward does live within.   Her daughter told me that Ms. Kant's phone may have been cut off due to her inability to pay her bills prior to her check coming at the end of the month, but that she will pass my contact information along to her mother when she gets a chance. I will also mail an unsuccessful contact letter to the patient with my contact information.   Plan - Will follow up in 5-7 business days if I have not heard back.   Catie Darnelle Maffucci, PharmD PGY2 Ambulatory Care Pharmacy Resident, [place] [facility] Phone: [phone]

## 2017-12-11 ENCOUNTER — Other Ambulatory Visit: Payer: Self-pay | Admitting: *Deleted

## 2017-12-11 ENCOUNTER — Other Ambulatory Visit: Payer: Self-pay | Admitting: Pharmacist

## 2017-12-11 NOTE — Patient Outreach (Signed)
[facility]) Care Management  [place]   12/11/2017  Samantha Aguirre 08/03/1945 [id number]   Reason for referral: medication assistance; transferring prescriptions to a pill packaging pharmacy  Referral source: Landis Martins, RN Current insurance: [employer] A + B  PMHx: CAD, HTN, hx CVA, PAF, COPD, T2DM   HPI: Patient recently evaluated in the [facility] ED for hematuria, prescribed ciprofloxacin 500 mg BID x 10 days. She contacted Landis Martins, RN because she did not receive the antibiotic until a couple of days later. She was frustrated by the delay, and would like to transfer her medications to another pharmacy that offers delivery and pill packaging.   Spoke with patient today; she is interested in transferring her medications to [facility] in [place], [place]. Upon medication review, she voiced some confusion about which medications she is supposed to be taking and how often she takes them. She did not recognize duloxetine/Cymbalta, and is unsure if she is taking this medication. She also states that she was told to start the potassium supplementation, but that she has not received a prescription for this medication from Dr. Ginette Pitman.   She notes that she was told to hold aspirin therapy until she completed the course of ciprofloxacin, and then to restart.   She notes that she is taking Incruse daily, but does not feel like she receives much benefit from this medication.    Objective: Lab Results  Component Value Date   CREATININE 0.65 11/28/2017   CREATININE 0.71 10/20/2017   CREATININE 0.63 09/23/2017  eGFR >60  Lab Results  Component Value Date   HGBA1C 6.5 (H) 10/21/2017    Lipid Panel     Component Value Date/Time   CHOL 142 10/21/2017 0321   TRIG 216 (H) 10/21/2017 0321   HDL 41 10/21/2017 0321   CHOLHDL 3.5 10/21/2017 0321   VLDL 43 (H) 10/21/2017 0321   LDLCALC 58 10/21/2017 0321    BP Readings from Last 3 Encounters:  11/28/17 (!)  107/56  11/11/17 140/70  10/21/17 (!) 146/49    Allergies  Allergen Reactions  . Ivp Dye [Iodinated Diagnostic Agents] Itching    Pt injected with IV contrast.  5 min after injection pt complained of itching behind ear and on her abd.  . Methotrexate Rash  . Morphine And Related Other (See Comments)    "stops my breathing" NEAR-RESPIRATORY ARREST  . Mushroom Extract Complex Anaphylaxis    Made patient "deathly sick"  . Atenolol Other (See Comments)    "MD took me off of it bc it was doing something wrong" Made patient HYPOTENSIVE  . Percocet [Oxycodone-Acetaminophen] Nausea And Vomiting and Other (See Comments)    Stomach pains  . Percodan [Oxycodone-Aspirin] Nausea And Vomiting and Other (See Comments)    Stomach pains  . Tramadol Nausea Only  . Verapamil Other (See Comments)    " MD told me this was screwing me up" MAKES PATIENT HYPOTENSIVE  . Oxycodone Hcl     Medications Reviewed Today    Reviewed by De Hollingshead, [facility] (Pharmacist) on 12/11/17 at 1152  Med List Status: <None>  Medication Order Taking? Sig Documenting Provider Last Dose Status Informant  albuterol (PROVENTIL HFA;VENTOLIN HFA) 108 (90 Base) MCG/ACT inhaler [id number] Yes Inhale 2 puffs into the lungs every 6 (six) hours as needed for wheezing or shortness of breath. Olin Hauser, DO Taking Active Pharmacy Records  aspirin 325 MG tablet [id number] No Take 1 tablet (325 mg total) by mouth  daily.  Patient not taking:  Reported on 12/11/2017   Elmarie Shiley, MD Not Taking Active Pharmacy Records           Med Note Darnelle Maffucci, Arville Lime   Fri Dec 11, 2017 11:50 AM) Holding until after finishing antibiotic course   atorvastatin (LIPITOR) 80 MG tablet [id number] Yes Take 1 tablet (80 mg total) by mouth daily at 6 PM. Parks Ranger Devonne Doughty, DO Taking Active Pharmacy Records  blood glucose meter kit and supplies [id number] Yes Dispense based on patient and insurance preference. Use up to four  times daily as directed. (FOR ICD-10 E10.9, E11.9). Elmarie Shiley, MD Taking Active Pharmacy Records  colestipol (COLESTID) 1 g tablet [id number] Yes Take 2 g by mouth 2 (two) times daily. [provider] Taking Active Pharmacy Records  diclofenac sodium (VOLTAREN) 1 % GEL [id number] Yes Apply 2 g topically 4 (four) times daily. Daymon Larsen, MD Taking Active Pharmacy Records           Med Note Cristie Hem   Mon Jul 06, 2017  9:53 AM)    DULoxetine (CYMBALTA) 60 MG capsule [id number] No Take 1 capsule (60 mg total) by mouth at bedtime. Olin Hauser, DO Unknown Active Pharmacy Records  feeding supplement, GLUCERNA SHAKE, (GLUCERNA SHAKE) LIQD [id number] No Take 237 mLs by mouth 2 (two) times daily between meals.  Patient not taking:  Reported on 10/22/2017   Elmarie Shiley, MD Not Taking Active Pharmacy Records  ferrous sulfate 325 (65 FE) MG EC tablet [id number] Yes Take 1 tablet (325 mg total) by mouth 2 (two) times daily with a meal.  Patient taking differently:  Take 325 mg by mouth daily.    Hillary Bow, MD Taking Active Pharmacy Records  fluticasone [facility]) 50 MCG/ACT nasal spray [id number] Yes Place 1 spray into both nostrils daily. Olin Hauser, DO Taking Active Pharmacy Records  furosemide (LASIX) 20 MG tablet [id number] Yes Take 1 tablet by mouth daily. [provider] Taking Active   gabapentin (NEURONTIN) 800 MG tablet [id number] Yes Take 1 tablet (800 mg total) by mouth 2 (two) times daily. Olin Hauser, DO Taking Active Pharmacy Records  glipiZIDE-metformin [facility]) 5-500 MG tablet [id number] Yes Take 1 tablet by mouth 2 (two) times daily before a meal. Parks Ranger Devonne Doughty, DO Taking Active Pharmacy Records           Med Note Darnelle Maffucci, Arville Lime   Fri Dec 11, 2017 11:51 AM) Taking 2 tablets in the morning, 1 tablet in evening  levETIRAcetam (KEPPRA) 500 MG tablet [id number] Yes Take 1 tablet (500 mg total) by  mouth 2 (two) times daily. Epifanio Lesches, MD Taking Active Pharmacy Records  levothyroxine (SYNTHROID, LEVOTHROID) 112 MCG tablet [id number] Yes Take 1 tablet (112 mcg total) by mouth daily before breakfast. Geradine Girt, DO Taking Active Pharmacy Records  lisinopril (PRINIVIL,ZESTRIL) 20 MG tablet [id number] Yes Take 20 mg by mouth daily. [provider] Taking Active   nitroGLYCERIN (NITROSTAT) 0.4 MG SL tablet [id number] No Place 1 tablet (0.4 mg total) under the tongue every 5 (five) minutes as needed for chest pain.  Patient not taking:  Reported on 12/11/2017   Bettey Costa, MD Not Taking Active Pharmacy Records  omeprazole (PRILOSEC) 20 MG capsule [id number] Yes Take 20 mg by mouth 2 (two) times daily. [provider] Taking Active Pharmacy Records           Med Note Bertell Maria, [facility] A   Mon Sep 28, 2017 10:29 AM)         Patient not taking:       Discontinued 37/94/32 7614 (Duplicate)            Med Note Dorina Hoyer, KIMBERLY P   Wed Sep 23, 2017 11:02 AM)    potassium chloride SA (K-DUR,KLOR-CON) 20 MEQ tablet [id number] No Take 20 mEq by mouth 2 (two) times daily. [provider] Not Taking Active Pharmacy Records           Med Note Darnelle Maffucci, Arville Lime   Fri Dec 11, 2017 11:52 AM) Has not received prescription from PCP  sucralfate (CARAFATE) 1 g tablet [id number] Yes TAKE (1) TABLET BY MOUTH FOUR TIMES A DAY Olin Hauser, DO Taking Active Pharmacy Records  topiramate (TOPAMAX) 25 MG tablet [id number] Yes Take 25 mg by mouth 2 (two) times daily. [provider] Taking Active Pharmacy Records  umeclidinium bromide (INCRUSE ELLIPTA) 62.5 MCG/INH AEPB [id number] Yes Inhale 1 puff into the lungs daily. Olin Hauser, DO Taking Active Pharmacy Records           Med Note Cristie Hem   Mon Jul 06, 2017  9:51 AM)    vitamin B-12 (CYANOCOBALAMIN) 1000 MCG tablet [id number] Yes Take 1 tablet by mouth daily. [provider]  Taking Active Pharmacy Records          Assessment:  Drugs sorted by system:  Neurologic/Psychologic: duloxetine, levatiracetam, topiramate  Cardiovascular: aspirin (currently holding), atorvastatin, colestipol, lisinopril, furosemide  Pulmonary/Allergy: albuterol HFA, Incruse, fluticasone nasal  Gastrointestinal: omeprazole, sucralfate  Endocrine: glipizide-metformin, levothyroxine,   Topical: diclofenac gel  Pain: gabapentin  Vitamins/Minerals/Supplements: Vitamin B12, ferrous sulfate, potassium (prescribed, not taking)  Medication Review Findings:  . Incruse: consider evaluating current pulmonary symptoms; patient may receive benefit from escalating to LABA/LAMA combination therapy  . Patient confusion regarding medications; when comparing [facility] medical record medication list with [facility] medication list in Care Everywhere, Eliquis still appears on [facility] medication list. Per review of cardiologist Dr. Clayborn Bigness visit note on 11/17/2017, it does not appears that she is to be taking Eliquis, but the medication still appears on the current prescriptions list. Appropriate to clarify intended medication list with cardiology and primary care prior to initiating prescription transfer to [place]: - Will fax our updated medication list to [facility] physicians for clarification of current medication list. Once reviewed, will contact [facility] on behalf of the patient and ask them to transfer medications from [place].  - Will reach out to Dr. Linton Ham office regarding prescription for potassium.  - Patient has my contact information, and was encouraged to reach out with any questions or concerns.   Catie Darnelle Maffucci, PharmD PGY2 Ambulatory Care Pharmacy Resident, [facility] Phone: [phone]

## 2017-12-11 NOTE — Patient Outreach (Addendum)
[facility]) Care Management  12/11/2017  Samantha Aguirre Jun 17, 1945 [id number]   Follow up phone call to patient to confirm that she completed her in home assessment through [facility]. Per patient, she did complete the assessment and has chosen an agency to use ([facility] The service start date is pending.   Plan: This social worker will contact patient within 2 weeks to confirm the start of in home cre.   [name] [facility] Care Management [phone]

## 2017-12-14 ENCOUNTER — Ambulatory Visit: Payer: Medicare Other | Admitting: *Deleted

## 2017-12-18 ENCOUNTER — Other Ambulatory Visit: Payer: Self-pay | Admitting: Pharmacist

## 2017-12-18 NOTE — Patient Outreach (Signed)
[facility]) Care Management  12/18/2017  Jerolene Kupfer August 15, 1945 [id number]  Reached out to Dr. Linton Ham office for medication list clarification as I had not heard back from my previous fax. Left message for Dr. Linton Ham nurse.   Per Care Everywhere, [facility] medication list still has:  - Breo  - Eliquis  And did NOT have:  - aspirin - furosemide - omeprazole - potassium - sucralfate  Will plan to clarify discrepancies, and then reach out to [facility] to transfer prescriptions from [facility].   Catie Darnelle Maffucci, PharmD PGY2 Ambulatory Care Pharmacy Resident, [facility] Phone: [phone]

## 2017-12-21 ENCOUNTER — Other Ambulatory Visit: Payer: Self-pay | Admitting: Pharmacist

## 2017-12-21 ENCOUNTER — Ambulatory Visit: Payer: Medicare Other | Admitting: Pharmacist

## 2017-12-21 ENCOUNTER — Other Ambulatory Visit: Payer: Self-pay | Admitting: *Deleted

## 2017-12-21 NOTE — Patient Outreach (Addendum)
Samantha Aguirre [facility]) Care Management  Samantha Aguirre 12/21/2017  Samantha Aguirre [birth year] [id number]  Covering inbasket for [facility] pharmacist Catie Darnelle Maffucci.   Reason for call: f/u medication discrepancies between patient list and medication lists with providers in Care Everywhere  10:10AM Care coordination call and message left with PCP Dr. Linton Ham office to request clarification on what patient should be taking.  Office confirmed they also received message from [place] on 12/18/2017.   10:23AM Care coordination call and message left with cardiologist, Dr. Clayborn Bigness to request clarification on medication list.    Plan: Await call back from Dr. Ginette Pitman and Dr. Clayborn Bigness.   Ralene Bathe, PharmD, [place] [phone]    ADDENDUM: Incoming call from Dr. Etta Quill RN, [place].  Medications from cardiology verified as follows:  ASA 325mg  qday Lipitor 80mg  qPM NTG SL PRN Furosemide 20mg  qday Potassium 20 mEq BID  RN confirmed PCP managing HTN.  New RXs will be sent into [facility] for patient to start compliance packs.   Call placed to [facility].  Spoke with [name].  Updated regarding plan to start packs.  [name] will hold all medications until list clarified with providers.  Patient contact information provided.   Will continue to wait for callback from Dr. Ginette Pitman.   Ralene Bathe, PharmD, [place] [phone]

## 2017-12-21 NOTE — Patient Outreach (Addendum)
[facility]) Care Management  12/21/2017  Samantha Aguirre 04-Jul-1945 [id number]  This social worker contacted patient to follow up on the start of personal care services. Per patient [facility] is full and is not taking any knew patient's. [facility] is now looking for another agency to use and per patient, they are checking on second and third choice now. Per patient, [facility] called her  last week to inform  her of this. Per patient, she has now accepted the fact that she needs the help.    Plan.: This social worker will continue to follow until personal care services are in place.   [name] [facility] Care Management [phone]

## 2017-12-22 ENCOUNTER — Other Ambulatory Visit: Payer: Self-pay | Admitting: Pharmacist

## 2017-12-22 ENCOUNTER — Ambulatory Visit: Payer: Self-pay | Admitting: Pharmacist

## 2017-12-22 ENCOUNTER — Ambulatory Visit: Payer: Self-pay | Admitting: *Deleted

## 2017-12-22 NOTE — Patient Outreach (Signed)
Samantha Aguirre) Care Management  [name] 12/22/2017  Samantha Aguirre 1945/03/23 [id number]  Reason for call: F/u on medication management  Patient called and left message yesterday with [facility] pharmacist Catie Darnelle Maffucci.  Return call placed to patient today as I am covering for Catie while she is out of town.  HIPAA identifiers verified.    Patient reports she receives compliance packs (filled x 1 month at a time) via [facility] in [place].  A provider recommended this service to her several years ago due to delivery services.  Patient wishes to continue with compliance packs and delivery but needs a pharmacy that is closer.  She has chosen [facility] as her new pharmacy.    Patient able to read off medication names from current pack of medications.  She has 1 month left of packs which will last through 01/23/2018.  She is also taking Aspirin 325mg  OTC.  She reports she has an appt with cardiologist in January and decision will be made whether she will go back on Eliquis or remain on ASA. Several medications from current list are missing.    Patient is NOT taking the following medications:    -Keppra -Potassium -Cymbalta -Omeprazole -Carafate -Albuterol -Incruse   3-way call placed to Dr. Linton Ham office.  3rd message left for RN to return call.  Per receptionist, Tina Griffiths will return call at 1:00 PM today. Aware this is a high-priority call due to patient missing several medications including an anticonvulsant.  [place] information + fax number provided to office.   Plan: Await call back from Dr. Linton Ham office.   Ralene Bathe, PharmD, [place] [phone]

## 2017-12-24 ENCOUNTER — Other Ambulatory Visit: Payer: Self-pay | Admitting: Pharmacist

## 2017-12-24 NOTE — Patient Outreach (Signed)
[place] [facility] [facility]) Care Management  [place] 12/24/2017  Halona Amstutz [birth year] [id number]  Reason for call: f/u on medication management  No return call from Dr. Linton Ham office yet. Fax of medication list received this afternoon however I have been trying to have Dr. Linton Ham nurse personally call me back to review possible discrepancies to expedite correcting inaccurate medications and discuss patient's new pharmacy.   Phone call attempt #4 (within the last 5 business days) placed to Dr. Linton Ham office.  Message left again requesting return call.    Plan: Continue to await call back from Dr. Linton Ham office staff.    Ralene Bathe, PharmD, [place] [phone]  ADDENDUM: Call returned from RN at Dr. Linton Ham office.  RN able to verify that patient should be taking Albuterol + Incruse and is not on Breo.  No active prescriptions for Carafate and Potassium.  All new prescriptions will be sent to [facility].   Care coordination call placed to [facility].  Confirmed receipt of prescriptions.  Pharmacist will reach out to patient directly to confirm when pillbottles can be delivered and when to start new packs (likely ~01/23/2017).   Call placed to patient to provide update.  Patient voiced understanding.  She will contact [facility] pharmacy to ensure no further medications are billed to her insurance from this pharmacy and no medications will be sent to her in the future.    Plan: I will f/u with patient next week to ensure patient has all medications prescribed.   Ralene Bathe, PharmD, [place] [phone]

## 2017-12-25 ENCOUNTER — Other Ambulatory Visit: Payer: Self-pay | Admitting: *Deleted

## 2017-12-25 NOTE — Patient Outreach (Signed)
[place] [facility]) Care Management  12/25/2017  Samantha Aguirre [birth year] [id number]  Telephone outreach call   Samantha Aguirre an 72 y.o.female Recent [facility]ation, Dx: TIA.  PMH includes but not limited to : Diabetes , COPD, Left side weakness, stroke, GI bleed Recent admissions : 9/10- 9/12, with TIA ED visit 11/16 Hematuria, UTI   Successful outreach call to patient .  Patient discussed not feeling well on this morning she reports stomach being a little upset , cramping and having a loose stool on today. Patient discussed not having much of an appetite on today and planning to take it easy on what she eats on today. Patient denies noting any blood in stools.  Discussed previously scheduled appointment with GI that was cancelled, states she does not feel she needs to see at specialist at this time, as she is not having diarrhea as in the past.   Further discussed  Hx/TIA, CVA Patient denies having any new symptoms of stroke or TIA , when reviewed symptoms. Patient discussed not being sure whether she wants to proceed with the watchman procedure, she plans to discuss further with [facility] at appointment in January.  Patient also declining need for neurology visit at this time, plans follow up with Dr.Hande in January.  Recent UTI Patient denies any symptoms of UTI when reviewed , reports urine clear , denies fever, burning or discomfort when urinating. Mobility concerns Patient continues to state problems with knee and plans follow up with PCP.  Patient shares that she hopefully will have her Hover around in the next month, reports paperwork got straighten out . Reinforced use of walker and fall prevention measures. Medications   Patient discussed [facility] pharmacist working with her on getting her medications switched to [facility] pill packaging . Patient states that she [facility], stating  that she has enough to last until 2nd week in January, states she discussed with [facility] pharmacist.   Patient denies any other concerns at this time .   Plan Will plan follow up call in the next month, for progression needs and assessment for additional care management needs.   [facility] CM Care Plan Problem One     Most Recent Value  Care Plan Problem One  Recent hospital readmission related to TIA   (Pended)   Role Documenting the Problem One  Care Management Coordinator  (Pended)   [place] for Problem One  Active  (Pended)   [facility] Long Term Goal   Patient will not experience a hospital admission in the next 60   days   (Pended)  [goal extended, recent ED visit ]  [place] Term Goal Start Date  10/22/17  (Pended)  Barrie Folk extended to address goals ]  [facility] CM Short Term Goal #1   Patient will attend all medical appointments over the next 20  days   (Pended)  [goal adjusted ]  [facility] CM Short Term Goal #1 Start Date  11/26/17  (Pended)   [facility] CM Short Term Goal #2   Patient will be able to have walker, scooter in place over the next 30 days   (Pended)   [facility] CM Short Term Goal #2 Start Date  11/26/17  (Pended)  Barrie Folk updated /extended ]  [facility] CM Short Term Goal #3  Patient will be able to report at least 3 symptoms of CVA/TIA and action plan   (Pended)   [facility] CM Short Term Goal #3 Start Date  10/22/17  (Pended)   [facility] CM Short Term Goal #3 Met Date  11/06/17  (Pended)       [name], RN, [place] Management Coordinator  [phone]- Mobile [phone]- Toll Free Main Office

## 2017-12-29 ENCOUNTER — Ambulatory Visit: Payer: Self-pay | Admitting: Pharmacist

## 2017-12-29 ENCOUNTER — Other Ambulatory Visit: Payer: Self-pay | Admitting: Pharmacist

## 2017-12-29 NOTE — Patient Outreach (Signed)
[place] [facility]) Care Management  [place] 12/29/2017  Jalan Bodi [birth year] [id number]  Reason for call: f/u on medications from [facility]  Successful outreach call to Samantha Aguirre today.  HIPAA identifiers verified.  Patient reports that she spoke with Samantha Aguirre last week and requested to not receive any further medication deliveries from this pharmacy.   New pharmacy, [facility], has received all current medication prescriptions from providers.  They will begin compliance packaging for patient in January when she finishes current oral supply.  Missing medications (Albuterol, Incruse, Keppra, Prilosec, Cymbalta) have been delivered and patient reports she feels much better now that she is taking all prescribed medications.  We reviewed that carafate is not active per Dr. Ginette Pitman and patient reports she is doing fine with acid reflux and GI issues.  We also reviewed low potassium from 11/28/2017 and that potassium was listed at that time as current but no longer on list per Dr. Linton Ham office.  Patient unsure if she took any extra potassium supplement.  She has lab appointment in February with office.  I encouraged her to call the office to ask if if she should schedule lab work sooner to recheck potassium and supplement as needed. Patient voiced understanding and will call today.  Plan: Will call patient in January to ensure compliance packs delivered from [facility] and patient understands how to use.   Ralene Bathe, PharmD, [place] [phone]

## 2017-12-31 ENCOUNTER — Other Ambulatory Visit: Payer: Self-pay | Admitting: *Deleted

## 2017-12-31 NOTE — Patient Outreach (Signed)
[facility]) Care Management  12/31/2017  Kerah Hardebeck 10/19/1945 [id number]   Phone call to patient to follow up on the start of in home services.. Per patient, [employer] has contacted her to state that they have not been able to find a agency to provide the in home care for her and that they will be sending her a list of additional agencies to choose from. This Education officer, museum discussed choosing at least 3 agencies to increase her chances of finding availability.   Patient also discussed that she will be getting her Hoveraround wheelchair delivered on 01/09/18.  Patient states that she has also received her dentures this week as well.   Plan: This social worker will contact patient within 2 weeks to review list of agencies. If list is obtained before patient to contact this Education officer, museum.   [name] [facility] Care Management [phone]

## 2018-01-08 ENCOUNTER — Other Ambulatory Visit: Payer: Self-pay | Admitting: *Deleted

## 2018-01-08 NOTE — Patient Outreach (Signed)
[facility]) Care Management  01/08/2018  Samantha Aguirre 1945-08-07 [id number]   Phone call to patient to follow up on the start of in home care. Per patiert, she received some paperwork regarding agencies to choose from, however the print is so small she cannot read it even with glasses. Per patient, she will call today to [facility] to see if they can walk her through it. Patient further states that she will get her [facility].  Plan: This Education officer, museum will follow up with patient within 2 weeks. If she does not have an assigned in home care agency, this social worker will schedule a home visit to assist patient with selecting a in home care agency.

## 2018-01-19 ENCOUNTER — Other Ambulatory Visit: Payer: Self-pay | Admitting: Pharmacist

## 2018-01-19 NOTE — Patient Outreach (Addendum)
[place] [facility]) Care Management  [place] 01/19/2018  Samantha Aguirre 27-Jan-1945 [id number]  Incoming call from patient on 01/18/2018 regarding start date and delivery of compliance packs from [name].   Successful call to patient today. Patient reports she has 5 days worth of medicines left. Patient will be starting compliance packs and medication delivery from new pharmacy this week, but had yet to hear from pharmacy to confirm date of delivery.  Three-way call placed to [name]. Per staff, compliance packs will be started today and delivered Friday afternoon after 3PM. Staff will call patient later today with price of compliance packing and medications. Patient reported understanding and has no other questions at this time.  Plan: Will follow-up with patient next week to check compliance packing was done correctly and delivered on Friday.  Justice Deeds, PharmD Candidate Class of 2020 [place] with above.  Ralene Bathe, PharmD, [place] [phone]

## 2018-01-21 ENCOUNTER — Other Ambulatory Visit: Payer: Self-pay | Admitting: *Deleted

## 2018-01-21 NOTE — Patient Outreach (Signed)
[facility]) Care Management  01/21/2018  Samantha Aguirre September 29, 1945 [id number]   Phone call to [facility] to follow up on referral for personal care services. This social worker spoke with Brenton Grills who stated that patient has chose [facility]. Her referral was faxed to them yesterday and they should hear back by tomorrow if her case was accepted or not.   Plan: This social worker will follow up within 1 week to confirm the start of personal care services.   [name] [facility] Care Management [phone]

## 2018-01-21 NOTE — Patient Outreach (Signed)
[place] [facility]) Care Management  01/21/2018  Samantha Aguirre Feb 20, 1945 [id number]   Telephone follow up   Samantha Aguirre an 73 y.o.female Recent admission to [facility] Observation, Dx: TIA.  PMH includes but not limited to : Diabetes , COPD, Aguirre side weakness, stroke, GI bleed Recent admissions : 9/10- 9/12, with TIA ED visit 11/16 Hematuria, UTI  Successful outreach call to patient, she discussed doing alright.   further discussed  Hx CVA/TIA No new symptoms or concerns, continuing  with ASA  Patient discussed follow up visit with Cardiology this month, regarding watchman procedure she voiced being hesitant regarding procedure and will discuss with MD. Patient voiced understanding of other anticoagulation due to GI bleed.  Discussed importance of measure to decrease stroke prevention, smoking cessation, blood pressure control , diabetes control  Patient discussed recent reading of blood pressure at home have been in the 135/80 range.   Fall Risk  Denies recent fall, using walker in home only ocassionally, holds to furniture in home . Patient reports she is able to tolerate activity in home, preparing simple meals, reports taking a shower still concern with fear of falling.  Patient now has Samantha Aguirre around and states apartment is building a ramp for easier access for her to get to sidewalk.   Diabetes  Reports that she does not have a meter yet to check her blood sugars , reports she continues to take medication. She denies having any symptoms of low blood sugar, able to states symptoms and how to treat. Patient discussed that she know how to handle Diabetes she has been dealing with this for a while, declined additional education need.  COPD Denies shortness of breath increased cough or change in sputum . Denies recent use of rescue inhaler .    Plan  Will place call to PCP office regarding CBG meter  Care plan goals addressed.. Will plan follow  up call to patient in the next month .   [facility] CM Care Plan Problem One     Most Recent Value  Care Plan Problem One  Recent hospital readmission related to TIA   Role Documenting the Problem One  Care Management [place] for Problem One  Active  [facility] Long Term Goal   Patient will not experience a hospital admission in the next 90   days   [facility] Long Term Goal Start Date  10/22/17 Samantha Aguirre extended to address goals ]  Interventions for Problem One Long Term Goal  Reinforced attending all medical appointments, reviewed upcoming appointments , review of current clinical state.   [facility] CM Short Term Goal #1   Patient will attend all medical appointments over the next 20  days  [goal adjusted ]  [facility] CM Short Term Goal #1 Start Date  11/26/17  [facility] CM Short Term Goal #1 Met Date  12/25/17  [facility] CM Short Term Goal #2   Patient will be able to have walker, scooter in place over the next 30 days   [facility] CM Short Term Goal #2 Start Date  12/25/17 [goal updated /extended ]  [facility] CM Short Term Goal #2 Met Date  01/21/18  [facility] CM Short Term Goal #3  Patient will be able to report at least 3 symptoms of CVA/TIA and action plan   [facility] CM Short Term Goal #3 Start Date  10/22/17  [facility] CM Short Term Goal #3 Met Date  11/06/17  [facility] CM Short Term Goal #4  Patient will be able to  identify at least 3 fall prevention measures over the next 30 days   [facility] CM Short Term Goal #4 Start Date  12/25/17  [facility] CM Short Term Goal #4 Met Date  01/21/18      [name], RN, [place] Management Coordinator  [phone]- Mobile [phone]- [place]

## 2018-01-22 ENCOUNTER — Other Ambulatory Visit: Payer: Self-pay | Admitting: *Deleted

## 2018-01-22 NOTE — Patient Outreach (Signed)
[place] [facility]) Care Management  01/22/2018  Samantha Aguirre Feb 02, 1945 [id number]   Care coordination call  Placed call to [name] to follow up on whether patient has a prescription on file for a blood sugar meter, spoke with [name] and she states that they are not contracted to bill medicare A&B for diabetic supplies.  She discussed that they have a free meter, and strip cost would be $10 per month.   Discussed with [name] regarding getting CBG meter.  Discussed with patient situation as she is aware that her medicare A&B will not cover diabetes supplies.  Discussed with patient program at local pharmacy that they will supply meter and strips would cost $10 per month.  Patient states that she will not be able to afford that extra $10 per month. Discussed with patient per [name] medication packets to be delivered on today.     Plan  Will plan return call in the next month. Care planning /goal setting.  Reinforced attending medical appointment  Will plan follow up call in the next month.     [name], RN, [place] Management Coordinator  [phone]- Mobile [phone]- Toll Free Main Office

## 2018-01-26 ENCOUNTER — Other Ambulatory Visit: Payer: Self-pay | Admitting: *Deleted

## 2018-01-26 NOTE — Patient Outreach (Signed)
[place] [facility]) Care Management  01/26/2018  Samantha Aguirre 01-Feb-1945 [id number]   Phone call to patient to confirm the start of in home care. Per patient, [facility] has accepted her case and will schedule an appointment to complete her intake within 1 week.   Plan: This Education officer, museum will follow up with patient within 2 weeks to ensure the start of in home care.   [name] [facility] Care Management [phone]

## 2018-01-28 ENCOUNTER — Other Ambulatory Visit: Payer: Self-pay | Admitting: Pharmacist

## 2018-01-28 NOTE — Patient Outreach (Addendum)
[place] [facility]) Care Management  [place]  01/28/2018  Amaya Blakeman 22-Aug-1945 [id number]  Reason for call: follow-up on compliance packs from [name] and BG meter  Unsuccessful telephone call attempt #1 to patient.   Unable to leave message. Voicemail not set up.  Plan:  I will make another outreach attempt to patient within 3-4 business days     Addendum:  Incoming return call from Ms. Calpietro.    Patient confirmed compliance pack medications were delivered from [facility] last week. Patient reports no issues with being adherent to medications and understands how to use them.    We discussed options for obtaining a BG meter and strips with patient.  Patient can either use [name] pharmacy to receive meter at no charge and pay $10 / month for strips or use retail pharmacy and pay 20% co-insurance.  At this time, patient has decided to not move forward with getting a meter/strips to check her BG due to transportation and cost.  Patient reports her PCP, Dr. Ginette Pitman, is aware that she is not checking blood sugars.  Patient most recent A1C is < 7 and she is not on insulin. Patient did not have any further medication related questions. Confirmed she has our phone number to call us if she has any questions in the future.  Katie Amilah Greenspan PharmD Candidate, Class of 2020 [place]  Agree with above: Ralene Bathe, PharmD, [place] [phone]

## 2018-02-05 ENCOUNTER — Other Ambulatory Visit: Payer: Self-pay | Admitting: *Deleted

## 2018-02-05 NOTE — Patient Outreach (Addendum)
[place] [facility]) Care Management  02/05/2018  Samantha Aguirre 1945/12/19 [id number]   Phone call to patient to confirm the start of personal care services. Per patient, she has not received a call yet regarding a tentative start date.    This social worker made a phone call to [place] at [place] ext [phone]. Per Jeani Hawking patient  is open and will be contacted to start next week. Patient schedule will be as follows:  Tuesday and Thursday afternoons form  1:30pm-3:30pm    Jeani Hawking will call patient today to confirm the above schedule. This Education officer, museum informed patient of the above schedule of which she agrees.   Plan:Patient verbalized having no additional community resource needs at this time Patient to be closed to social work.   [name] [facility] Care Management [phone]

## 2018-02-15 ENCOUNTER — Other Ambulatory Visit: Payer: Self-pay | Admitting: *Deleted

## 2018-02-15 NOTE — Patient Outreach (Signed)
[place] [facility]) Care Management  02/15/2018  Samantha Aguirre 1945/04/05 [id number]   Phone call to patient to confirm the start of personal care services. Per patient, they did not come last week because she was ill. There is now a plan for [facility] to start personal care services on 02/16/18. Patient's care schedule will be on Tuesday's and Thursday's from 1:30-2:30pm Patient verbalized having no additional social work needs at this time. This Education officer, museum to sign off at this time.   [name] [facility] Care Management [phone]

## 2018-02-24 ENCOUNTER — Other Ambulatory Visit: Payer: Self-pay | Admitting: *Deleted

## 2018-02-24 NOTE — Patient Outreach (Signed)
[place] [facility]) Care Management  02/24/2018  Samantha Aguirre Nov 07, 1945 [id number]  Case Closure   Recent admission to [facility] Observ[facility], Dx: TIA.  PMH includes but not limited to : Diabetes , COPD, Left side weakness, stroke, GI bleed Recent admissions : 9/10- 9/12, with TIA ED visit 11/16 Hematuria, UTI.   Successful outreach call to patient , she reports feeling okay on today, discussed feeling sick the week before, questions for specifics , just felt sick denies fever, cough.   Patient further discussed :  Social Patient is now active with personal care services with [facility], first visit was on yesterday and next planned visit on 2/14, patient states that they will visit 2 times per week for 2 hours each day.  Patient now how scooter that she uses to travel to nearby store, discussed safety with use of scooter.  Patient uses medicaid transportation for medical appointments.  History of TIA/CVA Patient denies new or recurrent symptoms, she is able to teachback symptoms of CVA and TIA and action plan Patient discussed having to cancel recent visit with Dr.Callwood to discuss possible, watchman procedure that she is states she will probably not have.  Patient continues on aspirin daily, no bleeding noted.  Diabetes  Patient has decided not to move forward on getting a meter, reports Dr.Hande is aware that she is not checking her blood sugars and her numbers have been good. (Noted recent A1c on 10/12/17 was 7.0), patient reports it will be checking again at visit in next week. Patient has declined need for additional education . Reinforced  Importance of follow through on annual eye exam as discussed previously . COPD Patient denies any increase in cough or shortness of breath . Reports taking inhaler incruse daily. Patient denies recent use of rescue inhaler. Patient continues to smoke and denies wanting to quit and states do not mention it again. Patient  reports that she is aware of worsening symptoms to notify MD of because she has been dealing with this for years.  Fall/Safety Denies recent fall, uses cane at times . Reinforced fall safety measures and well as measures of safety with using scooter outside home.  Medication  Patient is now receiving compliance packaging for medications through [name] and states that this is working out well.   Discussed current progress and any new concerns for nursing follow up education and support , patient denies stating that she has been managing her health conditions , contacts doctor as needed , calls for transportation.  Reinforced with patient continuing to keep all medical appointments and if she has to cancel an appointment to reschedule, discussed importance of notifying MD sooner for concerns.  Patient declined further needs at this stating things are settling down.Discussed case closure to community nurse, is agreeable, she doesn't feel need for health coach telephonic follow up as explained as she know what to do.   Plan  Will plan case closure , complex care management goals met Will send PCP case closure letter , will sent patient case closure letter for contact information if new concerns arise.    [name], RN, [place] Management Coordinator  [phone]- Mobile [phone]- Toll Free Main Office

## 2018-03-10 DIAGNOSIS — M545 Low back pain: Secondary | ICD-10-CM | POA: Diagnosis not present

## 2018-03-10 DIAGNOSIS — M5442 Lumbago with sciatica, left side: Secondary | ICD-10-CM | POA: Diagnosis not present

## 2018-03-10 DIAGNOSIS — I5032 Chronic diastolic (congestive) heart failure: Secondary | ICD-10-CM | POA: Diagnosis not present

## 2018-03-10 DIAGNOSIS — J449 Chronic obstructive pulmonary disease, unspecified: Secondary | ICD-10-CM | POA: Diagnosis not present

## 2018-03-12 ENCOUNTER — Other Ambulatory Visit: Payer: Self-pay | Admitting: Physician Assistant

## 2018-03-12 DIAGNOSIS — M545 Low back pain, unspecified: Secondary | ICD-10-CM

## 2018-03-12 DIAGNOSIS — M5440 Lumbago with sciatica, unspecified side: Secondary | ICD-10-CM

## 2018-03-13 ENCOUNTER — Observation Stay: Payer: Medicare Other

## 2018-03-13 ENCOUNTER — Other Ambulatory Visit: Payer: Self-pay

## 2018-03-13 ENCOUNTER — Inpatient Hospital Stay
Admission: EM | Admit: 2018-03-13 | Discharge: 2018-03-17 | DRG: 478 | Disposition: A | Discharge status: Home Health | Payer: Medicare Other | Attending: Internal Medicine | Admitting: Internal Medicine

## 2018-03-13 ENCOUNTER — Emergency Department: Payer: Medicare Other

## 2018-03-13 DIAGNOSIS — I480 Paroxysmal atrial fibrillation: Secondary | ICD-10-CM | POA: Diagnosis present

## 2018-03-13 DIAGNOSIS — Z9049 Acquired absence of other specified parts of digestive tract: Secondary | ICD-10-CM

## 2018-03-13 DIAGNOSIS — E785 Hyperlipidemia, unspecified: Secondary | ICD-10-CM | POA: Diagnosis present

## 2018-03-13 DIAGNOSIS — I639 Cerebral infarction, unspecified: Secondary | ICD-10-CM | POA: Diagnosis not present

## 2018-03-13 DIAGNOSIS — Z7982 Long term (current) use of aspirin: Secondary | ICD-10-CM

## 2018-03-13 DIAGNOSIS — Z79899 Other long term (current) drug therapy: Secondary | ICD-10-CM

## 2018-03-13 DIAGNOSIS — M810 Age-related osteoporosis without current pathological fracture: Secondary | ICD-10-CM | POA: Diagnosis present

## 2018-03-13 DIAGNOSIS — G40909 Epilepsy, unspecified, not intractable, without status epilepticus: Secondary | ICD-10-CM | POA: Diagnosis present

## 2018-03-13 DIAGNOSIS — M545 Low back pain: Secondary | ICD-10-CM | POA: Diagnosis not present

## 2018-03-13 DIAGNOSIS — G459 Transient cerebral ischemic attack, unspecified: Secondary | ICD-10-CM | POA: Diagnosis present

## 2018-03-13 DIAGNOSIS — R29810 Facial weakness: Secondary | ICD-10-CM | POA: Diagnosis present

## 2018-03-13 DIAGNOSIS — I5032 Chronic diastolic (congestive) heart failure: Secondary | ICD-10-CM | POA: Diagnosis not present

## 2018-03-13 DIAGNOSIS — R479 Unspecified speech disturbances: Secondary | ICD-10-CM | POA: Diagnosis present

## 2018-03-13 DIAGNOSIS — Z955 Presence of coronary angioplasty implant and graft: Secondary | ICD-10-CM

## 2018-03-13 DIAGNOSIS — I69354 Hemiplegia and hemiparesis following cerebral infarction affecting left non-dominant side: Secondary | ICD-10-CM

## 2018-03-13 DIAGNOSIS — S32050A Wedge compression fracture of fifth lumbar vertebra, initial encounter for closed fracture: Secondary | ICD-10-CM | POA: Diagnosis not present

## 2018-03-13 DIAGNOSIS — Z825 Family history of asthma and other chronic lower respiratory diseases: Secondary | ICD-10-CM

## 2018-03-13 DIAGNOSIS — R29701 NIHSS score 1: Secondary | ICD-10-CM | POA: Diagnosis present

## 2018-03-13 DIAGNOSIS — Z833 Family history of diabetes mellitus: Secondary | ICD-10-CM

## 2018-03-13 DIAGNOSIS — M5489 Other dorsalgia: Secondary | ICD-10-CM | POA: Diagnosis not present

## 2018-03-13 DIAGNOSIS — M47814 Spondylosis without myelopathy or radiculopathy, thoracic region: Secondary | ICD-10-CM | POA: Diagnosis not present

## 2018-03-13 DIAGNOSIS — Z885 Allergy status to narcotic agent status: Secondary | ICD-10-CM

## 2018-03-13 DIAGNOSIS — R52 Pain, unspecified: Secondary | ICD-10-CM

## 2018-03-13 DIAGNOSIS — R4781 Slurred speech: Secondary | ICD-10-CM | POA: Diagnosis present

## 2018-03-13 DIAGNOSIS — Z91041 Radiographic dye allergy status: Secondary | ICD-10-CM

## 2018-03-13 DIAGNOSIS — R202 Paresthesia of skin: Secondary | ICD-10-CM | POA: Diagnosis not present

## 2018-03-13 DIAGNOSIS — Z66 Do not resuscitate: Secondary | ICD-10-CM | POA: Diagnosis present

## 2018-03-13 DIAGNOSIS — Z419 Encounter for procedure for purposes other than remedying health state, unspecified: Secondary | ICD-10-CM

## 2018-03-13 DIAGNOSIS — Z716 Tobacco abuse counseling: Secondary | ICD-10-CM | POA: Diagnosis not present

## 2018-03-13 DIAGNOSIS — K219 Gastro-esophageal reflux disease without esophagitis: Secondary | ICD-10-CM | POA: Diagnosis present

## 2018-03-13 DIAGNOSIS — Z91018 Allergy to other foods: Secondary | ICD-10-CM

## 2018-03-13 DIAGNOSIS — Z85828 Personal history of other malignant neoplasm of skin: Secondary | ICD-10-CM

## 2018-03-13 DIAGNOSIS — M48061 Spinal stenosis, lumbar region without neurogenic claudication: Secondary | ICD-10-CM | POA: Diagnosis present

## 2018-03-13 DIAGNOSIS — E440 Moderate protein-calorie malnutrition: Secondary | ICD-10-CM | POA: Diagnosis present

## 2018-03-13 DIAGNOSIS — Z7951 Long term (current) use of inhaled steroids: Secondary | ICD-10-CM

## 2018-03-13 DIAGNOSIS — E119 Type 2 diabetes mellitus without complications: Secondary | ICD-10-CM | POA: Diagnosis present

## 2018-03-13 DIAGNOSIS — Z888 Allergy status to other drugs, medicaments and biological substances status: Secondary | ICD-10-CM

## 2018-03-13 DIAGNOSIS — E876 Hypokalemia: Secondary | ICD-10-CM | POA: Diagnosis present

## 2018-03-13 DIAGNOSIS — Z6829 Body mass index (BMI) 29.0-29.9, adult: Secondary | ICD-10-CM

## 2018-03-13 DIAGNOSIS — I872 Venous insufficiency (chronic) (peripheral): Secondary | ICD-10-CM | POA: Diagnosis present

## 2018-03-13 DIAGNOSIS — Z90710 Acquired absence of both cervix and uterus: Secondary | ICD-10-CM

## 2018-03-13 DIAGNOSIS — E039 Hypothyroidism, unspecified: Secondary | ICD-10-CM | POA: Diagnosis present

## 2018-03-13 DIAGNOSIS — Z7984 Long term (current) use of oral hypoglycemic drugs: Secondary | ICD-10-CM

## 2018-03-13 DIAGNOSIS — I110 Hypertensive heart disease with heart failure: Secondary | ICD-10-CM | POA: Diagnosis present

## 2018-03-13 DIAGNOSIS — R197 Diarrhea, unspecified: Secondary | ICD-10-CM | POA: Diagnosis not present

## 2018-03-13 DIAGNOSIS — I959 Hypotension, unspecified: Secondary | ICD-10-CM | POA: Diagnosis not present

## 2018-03-13 DIAGNOSIS — F17210 Nicotine dependence, cigarettes, uncomplicated: Secondary | ICD-10-CM | POA: Diagnosis present

## 2018-03-13 DIAGNOSIS — Z79890 Hormone replacement therapy (postmenopausal): Secondary | ICD-10-CM

## 2018-03-13 DIAGNOSIS — W19XXXA Unspecified fall, initial encounter: Secondary | ICD-10-CM

## 2018-03-13 DIAGNOSIS — Z8249 Family history of ischemic heart disease and other diseases of the circulatory system: Secondary | ICD-10-CM

## 2018-03-13 DIAGNOSIS — J449 Chronic obstructive pulmonary disease, unspecified: Secondary | ICD-10-CM | POA: Diagnosis present

## 2018-03-13 DIAGNOSIS — I6523 Occlusion and stenosis of bilateral carotid arteries: Secondary | ICD-10-CM | POA: Diagnosis not present

## 2018-03-13 DIAGNOSIS — I2510 Atherosclerotic heart disease of native coronary artery without angina pectoris: Secondary | ICD-10-CM | POA: Diagnosis present

## 2018-03-13 DIAGNOSIS — S32009A Unspecified fracture of unspecified lumbar vertebra, initial encounter for closed fracture: Secondary | ICD-10-CM | POA: Diagnosis present

## 2018-03-13 DIAGNOSIS — Z823 Family history of stroke: Secondary | ICD-10-CM

## 2018-03-13 DIAGNOSIS — R51 Headache: Secondary | ICD-10-CM | POA: Diagnosis not present

## 2018-03-13 DIAGNOSIS — I252 Old myocardial infarction: Secondary | ICD-10-CM

## 2018-03-13 DIAGNOSIS — I739 Peripheral vascular disease, unspecified: Secondary | ICD-10-CM | POA: Diagnosis present

## 2018-03-13 LAB — GLUCOSE, CAPILLARY
GLUCOSE-CAPILLARY: 136 mg/dL — AB (ref 70–99)
Glucose-Capillary: 124 mg/dL — ABNORMAL HIGH (ref 70–99)
Glucose-Capillary: 100 mg/dL — ABNORMAL HIGH (ref 70–99)

## 2018-03-13 LAB — COMPREHENSIVE METABOLIC PANEL
ALT: 16 U/L (ref 0–44)
AST: 22 U/L (ref 15–41)
Albumin: 3.9 g/dL (ref 3.5–5.0)
Alkaline Phosphatase: 123 U/L (ref 38–126)
Anion gap: 12 (ref 5–15)
BUN: 9 mg/dL (ref 8–23)
CO2: 25 mmol/L (ref 22–32)
Calcium: 9 mg/dL (ref 8.9–10.3)
Chloride: 99 mmol/L (ref 98–111)
Creatinine, Ser: 0.69 mg/dL (ref 0.44–1.00)
GFR calc Af Amer: >60 mL/min (ref >60)
Glucose, Bld: 132 mg/dL — ABNORMAL HIGH (ref 70–99)
POTASSIUM: 2.7 mmol/L — AB (ref 3.5–5.1)
Sodium: 136 mmol/L (ref 135–145)
Total Bilirubin: 0.6 mg/dL (ref 0.3–1.2)
Total Protein: 7.4 g/dL (ref 6.5–8.1)

## 2018-03-13 LAB — URINALYSIS, ROUTINE W REFLEX MICROSCOPIC
Bilirubin Urine: NEGATIVE
Glucose, UA: NEGATIVE mg/dL
HGB URINE DIPSTICK: NEGATIVE
Ketones, ur: NEGATIVE mg/dL
LEUKOCYTE UA: NEGATIVE
Nitrite: NEGATIVE
Protein, ur: NEGATIVE mg/dL
Specific Gravity, Urine: 1.005 (ref 1.005–1.030)
pH: 6 (ref 5.0–8.0)

## 2018-03-13 LAB — GASTROINTESTINAL PANEL BY PCR, STOOL (REPLACES STOOL CULTURE)
ASTROVIRUS: NOT DETECTED
Adenovirus F40/41: NOT DETECTED
Campylobacter species: NOT DETECTED
Cryptosporidium: NOT DETECTED
Cyclospora cayetanensis: NOT DETECTED
Entamoeba histolytica: NOT DETECTED
Enteroaggregative E coli (EAEC): NOT DETECTED
Enteropathogenic E coli (EPEC): NOT DETECTED
Enterotoxigenic E coli (ETEC): NOT DETECTED
Giardia lamblia: NOT DETECTED
Norovirus GI/GII: NOT DETECTED
Plesimonas shigelloides: NOT DETECTED
Rotavirus A: NOT DETECTED
SHIGA LIKE TOXIN PRODUCING E COLI (STEC): NOT DETECTED
Salmonella species: NOT DETECTED
Sapovirus (I, II, IV, and V): NOT DETECTED
Shigella/Enteroinvasive E coli (EIEC): NOT DETECTED
VIBRIO SPECIES: NOT DETECTED
Vibrio cholerae: NOT DETECTED
Yersinia enterocolitica: NOT DETECTED

## 2018-03-13 LAB — URINE DRUG SCREEN, QUALITATIVE (ARMC ONLY)
Amphetamines, Ur Screen: NOT DETECTED
Barbiturates, Ur Screen: NOT DETECTED
Benzodiazepine, Ur Scrn: NOT DETECTED
Cannabinoid 50 Ng, Ur ~~LOC~~: NOT DETECTED
Cocaine Metabolite,Ur ~~LOC~~: NOT DETECTED
MDMA (Ecstasy)Ur Screen: NOT DETECTED
METHADONE SCREEN, URINE: NOT DETECTED
Opiate, Ur Screen: NOT DETECTED
Phencyclidine (PCP) Ur S: NOT DETECTED
TRICYCLIC, UR SCREEN: NOT DETECTED

## 2018-03-13 LAB — CBC
HCT: 42.3 % (ref 36.0–46.0)
Hemoglobin: 13.9 g/dL (ref 12.0–15.0)
MCH: 28.6 pg (ref 26.0–34.0)
MCHC: 32.9 g/dL (ref 30.0–36.0)
MCV: 87 fL (ref 80.0–100.0)
PLATELETS: 279 10*3/uL (ref 150–400)
RBC: 4.86 MIL/uL (ref 3.87–5.11)
RDW: 13.3 % (ref 11.5–15.5)
WBC: 7.4 10*3/uL (ref 4.0–10.5)
nRBC: 0 % (ref 0.0–0.2)

## 2018-03-13 LAB — DIFFERENTIAL
Abs Immature Granulocytes: 0.02 10*3/uL (ref 0.00–0.07)
Basophils Absolute: 0.1 10*3/uL (ref 0.0–0.1)
Basophils Relative: 1 %
Eosinophils Absolute: 0.1 10*3/uL (ref 0.0–0.5)
Eosinophils Relative: 1 %
Immature Granulocytes: 0 %
Lymphocytes Relative: 31 %
Lymphs Abs: 2.3 10*3/uL (ref 0.7–4.0)
Monocytes Absolute: 0.5 10*3/uL (ref 0.1–1.0)
Monocytes Relative: 6 %
Neutro Abs: 4.5 10*3/uL (ref 1.7–7.7)
Neutrophils Relative %: 61 %

## 2018-03-13 LAB — ETHANOL: Alcohol, Ethyl (B): <10 mg/dL (ref <10)

## 2018-03-13 LAB — C DIFFICILE QUICK SCREEN W PCR REFLEX
C Diff antigen: NEGATIVE
C Diff interpretation: NOT DETECTED
C Diff toxin: NEGATIVE

## 2018-03-13 LAB — MAGNESIUM: Magnesium: 1.8 mg/dL (ref 1.7–2.4)

## 2018-03-13 LAB — OCCULT BLOOD X 1 CARD TO LAB, STOOL: Fecal Occult Bld: NEGATIVE

## 2018-03-13 LAB — PROTIME-INR
INR: 0.9 (ref 0.8–1.2)
Prothrombin Time: 12.1 seconds (ref 11.4–15.2)

## 2018-03-13 LAB — HEMOGLOBIN A1C
Hgb A1c MFr Bld: 6 % — ABNORMAL HIGH (ref 4.8–5.6)
Mean Plasma Glucose: 125.5 mg/dL

## 2018-03-13 LAB — APTT: aPTT: 27 seconds (ref 24–36)

## 2018-03-13 MED ORDER — FLUTICASONE PROPIONATE 50 MCG/ACT NA SUSP
1.0000 | Freq: Every day | NASAL | Status: DC
  Administered 2018-03-13 – 2018-03-17 (×5): 1 via NASAL
  Filled 2018-03-13: qty 16

## 2018-03-13 MED ORDER — LEVOTHYROXINE SODIUM 112 MCG PO TABS
112.0000 ug | ORAL_TABLET | Freq: Every day | ORAL | Status: DC
  Administered 2018-03-14 – 2018-03-17 (×3): 112 ug via ORAL
  Filled 2018-03-13 (×4): qty 1

## 2018-03-13 MED ORDER — INSULIN ASPART 100 UNIT/ML ~~LOC~~ SOLN: 0.0000 [IU] | Freq: Every day | SUBCUTANEOUS | Status: DC

## 2018-03-13 MED ORDER — STROKE: EARLY STAGES OF RECOVERY BOOK
Freq: Once | Status: AC
  Administered 2018-03-13: 12:00:00

## 2018-03-13 MED ORDER — INSULIN ASPART 100 UNIT/ML ~~LOC~~ SOLN
0.0000 [IU] | Freq: Three times a day (TID) | SUBCUTANEOUS | Status: DC
  Administered 2018-03-13 – 2018-03-16 (×3): 1 [IU] via SUBCUTANEOUS
  Administered 2018-03-17: 09:00:00 2 [IU] via SUBCUTANEOUS
  Administered 2018-03-17: 13:00:00 1 [IU] via SUBCUTANEOUS
  Filled 2018-03-13 (×5): qty 1

## 2018-03-13 MED ORDER — SUCRALFATE 1 G PO TABS
1.0000 g | ORAL_TABLET | Freq: Three times a day (TID) | ORAL | Status: DC
  Administered 2018-03-13 – 2018-03-17 (×13): 1 g via ORAL
  Filled 2018-03-13 (×12): qty 1

## 2018-03-13 MED ORDER — SODIUM CHLORIDE 0.9 % IV SOLN
INTRAVENOUS | Status: DC
  Administered 2018-03-13: 15:00:00 via INTRAVENOUS

## 2018-03-13 MED ORDER — PANTOPRAZOLE SODIUM 40 MG PO TBEC
40.0000 mg | DELAYED_RELEASE_TABLET | Freq: Every day | ORAL | Status: DC
  Administered 2018-03-14 – 2018-03-17 (×3): 40 mg via ORAL
  Filled 2018-03-13 (×3): qty 1

## 2018-03-13 MED ORDER — NITROGLYCERIN 0.4 MG SL SUBL: 0.4000 mg | SUBLINGUAL_TABLET | SUBLINGUAL | Status: DC | PRN

## 2018-03-13 MED ORDER — ENOXAPARIN SODIUM 40 MG/0.4ML ~~LOC~~ SOLN
40.0000 mg | SUBCUTANEOUS | Status: DC
  Administered 2018-03-13 – 2018-03-14 (×2): 40 mg via SUBCUTANEOUS
  Filled 2018-03-13 (×2): qty 0.4

## 2018-03-13 MED ORDER — GABAPENTIN 400 MG PO CAPS
800.0000 mg | ORAL_CAPSULE | Freq: Two times a day (BID) | ORAL | Status: DC
  Administered 2018-03-13 – 2018-03-17 (×7): 800 mg via ORAL
  Filled 2018-03-13: qty 2
  Filled 2018-03-13 (×2): qty 8
  Filled 2018-03-13: qty 2
  Filled 2018-03-13: qty 8
  Filled 2018-03-13 (×2): qty 2
  Filled 2018-03-13: qty 8
  Filled 2018-03-13 (×4): qty 2
  Filled 2018-03-13: qty 8
  Filled 2018-03-13: qty 2

## 2018-03-13 MED ORDER — ACETAMINOPHEN 650 MG RE SUPP: 650.0000 mg | Freq: Four times a day (QID) | RECTAL | Status: DC | PRN

## 2018-03-13 MED ORDER — CAPSICUM OLEORESIN 0.025 % EX CREA
TOPICAL_CREAM | Freq: Two times a day (BID) | CUTANEOUS | Status: DC
  Administered 2018-03-14: 09:00:00 via TOPICAL
  Filled 2018-03-13: qty 60

## 2018-03-13 MED ORDER — ONDANSETRON HCL 4 MG PO TABS: 4.0000 mg | ORAL_TABLET | Freq: Four times a day (QID) | ORAL | Status: DC | PRN

## 2018-03-13 MED ORDER — LEVETIRACETAM 500 MG PO TABS
500.0000 mg | ORAL_TABLET | Freq: Two times a day (BID) | ORAL | Status: DC
  Administered 2018-03-13 – 2018-03-17 (×7): 500 mg via ORAL
  Filled 2018-03-13 (×9): qty 1

## 2018-03-13 MED ORDER — ASPIRIN 325 MG PO TABS
325.0000 mg | ORAL_TABLET | Freq: Every day | ORAL | Status: DC
  Administered 2018-03-14 – 2018-03-17 (×3): 325 mg via ORAL
  Filled 2018-03-13 (×4): qty 1

## 2018-03-13 MED ORDER — COLESTIPOL HCL 1 G PO TABS
2.0000 g | ORAL_TABLET | Freq: Two times a day (BID) | ORAL | Status: DC
  Administered 2018-03-13 – 2018-03-17 (×7): 2 g via ORAL
  Filled 2018-03-13 (×9): qty 2

## 2018-03-13 MED ORDER — GABAPENTIN 800 MG PO TABS
800.0000 mg | ORAL_TABLET | Freq: Two times a day (BID) | ORAL | Status: DC
  Filled 2018-03-13: qty 1

## 2018-03-13 MED ORDER — POTASSIUM CHLORIDE CRYS ER 20 MEQ PO TBCR
40.0000 meq | EXTENDED_RELEASE_TABLET | ORAL | Status: AC
  Administered 2018-03-13: 40 meq via ORAL
  Filled 2018-03-13: qty 2

## 2018-03-13 MED ORDER — DICLOFENAC SODIUM 1 % TD GEL
2.0000 g | Freq: Four times a day (QID) | TRANSDERMAL | Status: DC
  Filled 2018-03-13: qty 100

## 2018-03-13 MED ORDER — MAGNESIUM SULFATE 2 GM/50ML IV SOLN
2.0000 g | Freq: Once | INTRAVENOUS | Status: AC
  Administered 2018-03-13: 15:00:00 2 g via INTRAVENOUS
  Filled 2018-03-13: qty 50

## 2018-03-13 MED ORDER — ONDANSETRON HCL 4 MG/2ML IJ SOLN: 4.0000 mg | Freq: Four times a day (QID) | INTRAMUSCULAR | Status: DC | PRN

## 2018-03-13 MED ORDER — ATORVASTATIN CALCIUM 20 MG PO TABS
80.0000 mg | ORAL_TABLET | Freq: Every day | ORAL | Status: DC
  Administered 2018-03-13 – 2018-03-15 (×3): 80 mg via ORAL
  Filled 2018-03-13 (×3): qty 4

## 2018-03-13 MED ORDER — DULOXETINE HCL 30 MG PO CPEP
60.0000 mg | ORAL_CAPSULE | Freq: Every day | ORAL | Status: DC
  Administered 2018-03-13 – 2018-03-16 (×4): 60 mg via ORAL
  Filled 2018-03-13: qty 2
  Filled 2018-03-13 (×2): qty 1
  Filled 2018-03-13: qty 2
  Filled 2018-03-13 (×2): qty 1
  Filled 2018-03-13 (×2): qty 2

## 2018-03-13 MED ORDER — LORAZEPAM 2 MG/ML IJ SOLN
0.5000 mg | Freq: Once | INTRAMUSCULAR | Status: AC | PRN
  Administered 2018-03-13: 12:00:00 0.5 mg via INTRAVENOUS
  Filled 2018-03-13: qty 1

## 2018-03-13 MED ORDER — ACETAMINOPHEN 325 MG PO TABS
650.0000 mg | ORAL_TABLET | Freq: Four times a day (QID) | ORAL | Status: DC | PRN
  Administered 2018-03-13: 15:00:00 650 mg via ORAL
  Filled 2018-03-13: qty 2

## 2018-03-13 MED ORDER — CYANOCOBALAMIN 500 MCG PO TABS
500.0000 ug | ORAL_TABLET | Freq: Every day | ORAL | Status: DC
  Administered 2018-03-14 – 2018-03-17 (×3): 500 ug via ORAL
  Filled 2018-03-13 (×3): qty 1

## 2018-03-13 MED ORDER — UMECLIDINIUM BROMIDE 62.5 MCG/INH IN AEPB
1.0000 | INHALATION_SPRAY | Freq: Every day | RESPIRATORY_TRACT | Status: DC
  Administered 2018-03-14 – 2018-03-17 (×4): 1 via RESPIRATORY_TRACT
  Filled 2018-03-13: qty 7

## 2018-03-13 NOTE — Progress Notes (Signed)
Patient ID: Samantha Aguirre, female   DOB: 09-Dec-1945, 73 y.o.   MRN: [id number]  ACP note  Patient present  Diagnosis left facial droop concerning for stroke or TIA.  CODE STATUS discussed and patient wishes to be a DO NOT RESUSCITATE  The patient has a history of GI bleed and the last gastroenterology note recommended that she was high risk for GI bleed with any anticoagulation.  This makes it very difficult to put on maximum treatment to prevent stroke.  The patient understands this and wishes to be a DO NOT RESUSCITATE at this time.  Time spent on ACP discussion 17 minutes Dr. Loletha Grayer

## 2018-03-13 NOTE — H&P (Signed)
[facility] NAME: Francisca Langenderfer    MR#:  [id number]  DATE OF BIRTH:  [birth year]  DATE OF ADMISSION:  03/13/2018  PRIMARY CARE PHYSICIAN: Tracie Harrier, MD   REQUESTING/REFERRING PHYSICIAN: Dr Lavonia Drafts  CHIEF COMPLAINT:   Chief Complaint  Patient presents with  . Headache  . Back Pain    HISTORY OF PRESENT ILLNESS:  Ferne Ellingwood  is a 73 y.o. female woke up this morning with left facial mouth drooping and her left eyelid could not decide whether he wanted to be closed or open.  She is also had a history of stroke in the past and she has difficulty getting her words out.  She knows what she wants to say but just cannot say correctly.  Yesterday she also went to the orthopedics and diagnosed with a compression fracture of the lumbar spine and they ordered an MRI.  Last year had a bleeding AVMs in the small bowel requiring a push enteroscopy.  Hospitalist services were contacted for further evaluation of neurological symptoms.  PAST MEDICAL HISTORY:   Past Medical History:  Diagnosis Date  . Acid reflux   . Arthritis    Bilateral knees, hands, feet  . Asthma   . Atrial fibrillation ([place])   . Cancer ([place])   . CHF (congestive heart failure) (HCC)    Diastolic CHF  . COPD (chronic obstructive pulmonary disease) ([place])   . Coronary artery disease   . Diabetes mellitus without complication ([place])   . Frequent headaches   . GI bleed   . HLD (hyperlipidemia)   . Hypertension   . Seizures ([place])   . Skin cancer 2015   Suspected basal cell carcinoma on nose, surgically removed  . Stroke ([place]) 04/2017  . Thyroid disease   . Tremors of nervous system     PAST SURGICAL HISTORY:   Past Surgical History:  Procedure Laterality Date  . ABDOMINAL HYSTERECTOMY  1976  . APPENDECTOMY  1980  . CAROTID ENDARTERECTOMY    . CHOLECYSTECTOMY  1980  . COLONOSCOPY N/A 02/20/2017   Procedure: COLONOSCOPY;  Surgeon:  Lin Landsman, MD;  Location: [facility] ENDOSCOPY;  Service: Gastroenterology;  Laterality: N/A;  . ESOPHAGOGASTRODUODENOSCOPY N/A 02/20/2017   Procedure: ESOPHAGOGASTRODUODENOSCOPY (EGD);  Surgeon: Lin Landsman, MD;  Location: [facility] ENDOSCOPY;  Service: Gastroenterology;  Laterality: N/A;  . ESOPHAGOGASTRODUODENOSCOPY (EGD) WITH PROPOFOL N/A 07/08/2017   Procedure: ESOPHAGOGASTRODUODENOSCOPY (EGD) WITH PROPOFOL;  Surgeon: [name], MD;  Location[facility] ENDOSCOPY;  Service: Endoscopy;  Laterality: N/A;  . [place]  . GIVENS CAPSULE STUDY N/A 07/09/2017   Procedure: GIVENS CAPSULE STUDY;  Surgeon: Jonathon Bellows, MD;  Location: [facility] ENDOSCOPY;  Service: Gastroenterology;  Laterality: N/A;  . KNEE SURGERY     left  . ROTATOR CUFF REPAIR     right  . TONSILLECTOMY  1950    SOCIAL HISTORY:   Social History   Tobacco Use  . Smoking status: Current Every Day Smoker    Packs/day: 1.00    Years: 53.00    Pack years: 53.00    Types: Cigarettes  . Smokeless tobacco: Never Used  Substance Use Topics  . Alcohol use: No    FAMILY HISTORY:   Family History  Problem Relation Age of Onset  . Breast cancer Mother   . Heart disease Mother   . Stroke Mother   . Cancer Mother   . COPD Mother   . Diabetes Mother   .  Heart disease Father   . Diabetes Father   . Stroke Father   . Alcohol abuse Sister   . Drug abuse Sister   . Stroke Sister   . Cancer Sister   . Mental illness Sister   . Heart disease Brother   . Arthritis Brother   . Diabetes Brother   . Heart disease Maternal Grandfather   . Heart disease Paternal Grandfather     DRUG ALLERGIES:   Allergies  Allergen Reactions  . Ivp Dye [Iodinated Diagnostic Agents] Itching    Pt injected with IV contrast.  5 min after injection pt complained of itching behind ear and on her abd.  . Methotrexate Rash  . Morphine And Related Other (See Comments)    "stops my breathing" NEAR-RESPIRATORY ARREST  . Mushroom  Extract Complex Anaphylaxis    Made patient "deathly sick"  . Atenolol Other (See Comments)    "MD took me off of it bc it was doing something wrong" Made patient HYPOTENSIVE  . Percocet [Oxycodone-Acetaminophen] Nausea And Vomiting and Other (See Comments)    Stomach pains  . Percodan [Oxycodone-Aspirin] Nausea And Vomiting and Other (See Comments)    Stomach pains  . Tramadol Nausea Only  . Verapamil Other (See Comments)    " MD told me this was screwing me up" MAKES PATIENT HYPOTENSIVE  . Oxycodone Hcl     REVIEW OF SYSTEMS:  CONSTITUTIONAL: No fever.  Positive for chills.  Positive for weight loss for 42 pounds over a year.  Some left-sided weakness.  EYES: Left eyelid problem this morning.  Some times has blurry vision but that is been going on for a while. EARS, NOSE, AND THROAT: No tinnitus or ear pain.  Positive for runny nose and sore throat RESPIRATORY: Occasional cough, shortness of breath, and wheezing.  No hemoptysis.  CARDIOVASCULAR: Occasional chest pain.  No orthopnea, edema.  GASTROINTESTINAL: No nausea, vomiting.  Occasional lower abdominal pain.  Diarrhea 3 times a day going on for a while no blood in bowel movements GENITOURINARY: No dysuria, hematuria.  ENDOCRINE: No polyuria, nocturia,  HEMATOLOGY: No anemia, easy bruising or bleeding SKIN: No rash or lesion. MUSCULOSKELETAL: Positive for low back pain NEUROLOGIC: No tingling, numbness, weakness.  PSYCHIATRY: Some depression but no thoughts of hurting herself or other people  MEDICATIONS AT HOME:   Prior to Admission medications   Medication Sig Start Date End Date Taking? Authorizing Provider  albuterol (PROVENTIL HFA;VENTOLIN HFA) 108 (90 Base) MCG/ACT inhaler Inhale 2 puffs into the lungs every 6 (six) hours as needed for wheezing or shortness of breath. 10/08/16  Yes Karamalegos, Devonne Doughty, DO  aspirin 325 MG tablet Take 1 tablet (325 mg total) by mouth daily. 08/03/17  Yes Regalado, Belkys A, MD   atorvastatin (LIPITOR) 80 MG tablet Take 1 tablet (80 mg total) by mouth daily at 6 PM. 09/24/16  Yes Karamalegos, Devonne Doughty, DO  colestipol (COLESTID) 1 g tablet Take 2 g by mouth 2 (two) times daily. 10/13/17  Yes [provider]  diclofenac sodium (VOLTAREN) 1 % GEL Apply 2 g topically 4 (four) times daily. 01/11/16  Yes Daymon Larsen, MD  DULoxetine (CYMBALTA) 60 MG capsule Take 1 capsule (60 mg total) by mouth at bedtime. 09/24/16  Yes Karamalegos, Devonne Doughty, DO  ferrous sulfate 325 (65 FE) MG EC tablet Take 1 tablet (325 mg total) by mouth 2 (two) times daily with a meal. Patient taking differently: Take 325 mg by mouth daily.  02/21/17  Yes Sudini, Alveta Heimlich, MD  fluticasone (FLONASE) 50 MCG/ACT nasal spray Place 1 spray into both nostrils daily. 10/08/16  Yes Karamalegos, Devonne Doughty, DO  furosemide (LASIX) 20 MG tablet Take 1 tablet by mouth daily. 11/06/17  Yes [provider]  gabapentin (NEURONTIN) 800 MG tablet Take 1 tablet (800 mg total) by mouth 2 (two) times daily. 09/24/16  Yes Karamalegos, Devonne Doughty, DO  glipiZIDE-metformin (METAGLIP) 5-500 MG tablet Take 1 tablet by mouth 2 (two) times daily before a meal. 04/02/17  Yes Karamalegos, Devonne Doughty, DO  levETIRAcetam (KEPPRA) 500 MG tablet Take 1 tablet (500 mg total) by mouth 2 (two) times daily. 09/19/15  Yes Epifanio Lesches, MD  levothyroxine (SYNTHROID, LEVOTHROID) 112 MCG tablet Take 1 tablet (112 mcg total) by mouth daily before breakfast. 09/24/17  Yes Vann, Jessica U, DO  lisinopril (PRINIVIL,ZESTRIL) 20 MG tablet Take 20 mg by mouth daily.   Yes [provider]  nitroGLYCERIN (NITROSTAT) 0.4 MG SL tablet Place 1 tablet (0.4 mg total) under the tongue every 5 (five) minutes as needed for chest pain. 02/28/16  Yes Mody, Ulice Bold, MD  omeprazole (PRILOSEC) 20 MG capsule Take 20 mg by mouth 2 (two) times daily. 09/02/17  Yes [provider]  sucralfate (CARAFATE) 1 g tablet TAKE (1) TABLET BY MOUTH  FOUR TIMES A DAY 03/26/17  Yes Karamalegos, Devonne Doughty, DO  umeclidinium bromide (INCRUSE ELLIPTA) 62.5 MCG/INH AEPB Inhale 1 puff into the lungs daily. 11/13/15  Yes Karamalegos, Devonne Doughty, DO  vitamin B-12 (CYANOCOBALAMIN) 500 MCG tablet Take 1 tablet by mouth daily.  06/29/17  Yes [provider]  blood glucose meter kit and supplies Dispense based on patient and insurance preference. Use up to four times daily as directed. (FOR ICD-10 E10.9, E11.9). Patient not taking: Reported on 03/13/2018 08/03/17   Regalado, Jerald Kief A, MD      VITAL SIGNS:  Blood pressure (!) 100/59, pulse (!) 59, temperature (!) 97.5 F (36.4 C), temperature source Oral, resp. rate 20, height '5\' 1"'  (1.549 m), weight 70.8 kg, SpO2 100 %.  PHYSICAL EXAMINATION:  GENERAL:  73 y.o.-year-old patient lying in the bed with no acute distress.  EYES: Pupils equal, round, reactive to light and accommodation. No scleral icterus. Extraocular muscles intact.  HEENT: Head atraumatic, normocephalic. Oropharynx and nasopharynx clear.  NECK:  Supple, no jugular venous distention. No thyroid enlargement, no tenderness.  LUNGS: Normal breath sounds bilaterally, no wheezing, rales,rhonchi or crepitation. No use of accessory muscles of respiration.  CARDIOVASCULAR: S1, S2 normal. No murmurs, rubs, or gallops.  ABDOMEN: Soft, nontender, nondistended. Bowel sounds present. No organomegaly or mass.  EXTREMITIES: No pedal edema, cyanosis, or clubbing.  NEUROLOGIC: Cranial nerves II through XII are intact. Muscle strength 4+/5 in left upper and lower extremity.  5 out of 5 power in right upper and lower extremity. Sensation intact. Gait not checked.  PSYCHIATRIC: The patient is alert and oriented x 3.  SKIN: No rash, lesion, or ulcer.   LABORATORY PANEL:   CBC Recent Labs  Lab 03/13/18 0826  WBC 7.4  HGB 13.9  HCT 42.3  PLT 279    ------------------------------------------------------------------------------------------------------------------  Chemistries  Recent Labs  Lab 03/13/18 0826  NA 136  K 2.7*  CL 99  CO2 25  GLUCOSE 132*  BUN 9  CREATININE 0.69  CALCIUM 9.0  MG 1.8  AST 22  ALT 16  ALKPHOS 123  BILITOT 0.6   ------------------------------------------------------------------------------------------------------------------   RADIOLOGY:  Dg Thoracic Spine 2 View  Result  Date: 03/13/2018 CLINICAL DATA:  Low back pain for several days EXAM: THORACIC SPINE 2 VIEWS COMPARISON:  None. FINDINGS: Vertebral body height is well maintained. L1 compression fracture is noted slightly progressed when compared with the prior CT examination from 07/15/2017. Multilevel osteophytic changes are seen. The pedicles are within normal limits. No paraspinal mass is noted. Calcified granuloma is noted in the left lung. IMPRESSION: L1 compression deformity somewhat progressed when compared with the prior exam. Degenerative changes of the thoracic spine without acute abnormality. Electronically Signed   By: Inez Catalina M.D.   On: 03/13/2018 11:05   Ct Head Code Stroke Wo Contrast  Result Date: 03/13/2018 CLINICAL DATA:  Code stroke.  Frontal headache.  Left facial droop. EXAM: CT HEAD WITHOUT CONTRAST TECHNIQUE: Contiguous axial images were obtained from the base of the skull through the vertex without intravenous contrast. COMPARISON:  Head CT and MRI 10/20/2017 FINDINGS: Brain: A chronic moderate-sized infarct is again noted in the left cerebral hemisphere near the junction of the temporal, parietal, and occipital lobes. Small chronic infarcts are again seen in the left greater than right cerebellar hemispheres. No acute infarct, intracranial hemorrhage, midline shift, or extra-axial fluid collection is identified. Patchy to confluent hypodensities in the cerebral white matter bilaterally are similar to the prior CT and  compatible with extensive chronic small vessel ischemic disease. There is mild cerebral atrophy. A heavily calcified 1 cm extra-axial mass in the right parasagittal frontoparietal vertex region is unchanged and compatible with a meningioma. Vascular: Calcified atherosclerosis at the skull base. No hyperdense vessel. Skull: No fracture or suspicious osseous lesion. Sinuses/Orbits: Visualized paranasal sinuses and mastoid air cells are clear. Visualized orbits are unremarkable. Other: None. ASPECTS [facility] Stroke Program Early CT Score) - Ganglionic level infarction (caudate, lentiform nuclei, internal capsule, insula, M1-M3 cortex): 7 - Supraganglionic infarction (M4-M6 cortex): 3 Total score (0-10 with 10 being normal): 10 IMPRESSION: 1. No evidence of acute intracranial abnormality. 2. ASPECTS is 10. 3. Extensive chronic ischemic changes as above. These results were called by telephone at the time of interpretation on 03/13/2018 at 9:01 am to Dr. Lavonia Drafts , who verbally acknowledged these results. Electronically Signed   By: Logan Bores M.D.   On: 03/13/2018 09:02    EKG:   Sinus bradycardia 55 bpm  IMPRESSION AND PLAN:   1.  Left facial droop and eyelid droop which looks like it has resolved.  Will admit as observation.  Continue on aspirin.  Order an MRI of the brain, carotid ultrasound and echocardiogram.  Hesitant on increasing anticoagulation with GI bleed last year with AVMs.  Physical therapy, Occupational Therapy and speech therapy evaluations.  Patient was eating when I saw her.  I see no history of atrial fibrillation in the past.  Monitor on telemetry.  Stroke risk will be higher with that history. 2.  Hypokalemia.  Check a magnesium and replace if low.  Replace oral potassium. 3.  Diarrhea send off stool studies and guaiac. 4.  Relative hypotension.  Gentle IV fluids.  Hold antihypertensive medications. 5.  Type 2 diabetes mellitus.  Put on sliding scale for now and monitor sugars  check a hemoglobin A1c. 6.  History of seizure on Keppra 7.  Hypothyroidism unspecified on levothyroxine 8.  Hyperlipidemia unspecified on high-dose atorvastatin check a lipid profile. 9.  Tobacco abuse.  Smoking cessation counseling done 4 minutes by me.  Nicotine patch refused.  All the records are reviewed and case discussed with ED provider. Management plans discussed  with the patient, and she is in agreement.  CODE STATUS: DNR  TOTAL TIME TAKING CARE OF THIS PATIENT: 55 minutes, including acp time.    Loletha Grayer M.D on 03/13/2018 at 11:33 AM  Between 7am to 6pm - Pager - [phone]  After 6pm call admission pager [phone]  [facility]  [phone]  CC: Primary care physician; Tracie Harrier, MD

## 2018-03-13 NOTE — ED Provider Notes (Signed)
[facility] Emergency Department Provider Note   ____________________________________________    I have reviewed the triage vital signs and the nursing notes.   HISTORY  Chief Complaint Headache and facial droop    HPI Samantha Aguirre is a 73 y.o. female who presents with concerns of a possible stroke.  Patient reports she has had a CVA in the past which is left her with some chronic deficits primarily in her left lower leg.  She notes today around 7:15 AM she developed a frontal headache and left facial droop.  No arm or leg weakness.  Has not take anything for this.  No fevers or chills or neck pain.  Recently diagnosed with possible compression fracture of the back, has outpatient MRI of her spine ordered for that  Past Medical History:  Diagnosis Date  . Acid reflux   . Arthritis    Bilateral knees, hands, feet  . Asthma   . Atrial fibrillation ([place])   . Cancer ([place])   . CHF (congestive heart failure) (HCC)    Diastolic CHF  . COPD (chronic obstructive pulmonary disease) ([place])   . Coronary artery disease   . Diabetes mellitus without complication ([place])   . Frequent headaches   . GI bleed   . HLD (hyperlipidemia)   . Hypertension   . Seizures ([place])   . Skin cancer 2015   Suspected basal cell carcinoma on nose, surgically removed  . Stroke ([place]) 04/2017  . Thyroid disease   . Tremors of nervous system     Patient Active Problem List   Diagnosis Date Noted  . TIA (transient ischemic attack) 09/22/2017  . PAF (paroxysmal atrial fibrillation) ([place])   . Acute CVA (cerebrovascular accident) ([place]) 07/30/2017  . GI bleed 07/08/2017  . Anemia 07/08/2017  . Angiodysplasia of stomach and duodenum with hemorrhage   . CVA (cerebral vascular accident) ([place]) 07/06/2017  . Stroke (cerebrum) ([place])   . Goals of care, counseling/discussion   . Palliative care encounter   . COPD exacerbation ([place]) 04/03/2017  . Flu 03/27/2017  . Iron deficiency  anemia due to chronic blood loss   . Migraine 02/13/2017  . Cataracts, bilateral 01/15/2017  . Chronic venous insufficiency 10/06/2016  . Recurrent major depressive disorder, in partial remission ([place]) 10/06/2016  . Left-sided weakness 10/03/2016  . Chest pain 02/27/2016  . History of stroke 02/19/2016  . Thumb pain, left 01/22/2016  . Traumatic closed nondisplaced fracture of base of metacarpal bone of left thumb 01/22/2016  . Colon cancer screening 11/27/2015  . Depression 11/14/2015  . Coronary artery disease 11/13/2015  . Hypertension 11/13/2015  . History of skin cancer 11/13/2015  . Osteoporosis 11/13/2015  . Chronic low back pain 11/13/2015  . Left medial knee pain 11/13/2015  . Osteoarthritis of multiple joints 11/13/2015  . Other specified hypothyroidism 08/16/2015  . Controlled type 2 diabetes mellitus with diabetic neuropathy ([place]) 08/16/2015  . COPD (chronic obstructive pulmonary disease) ([place]) 08/16/2015  . Tobacco abuse 08/16/2015  . HLD (hyperlipidemia) 08/16/2015  . History of CHF (congestive heart failure) 08/16/2015  . Seizure disorder [facility])     Past Surgical History:  Procedure Laterality Date  . ABDOMINAL HYSTERECTOMY  1976  . APPENDECTOMY  1980  . CAROTID ENDARTERECTOMY    . CHOLECYSTECTOMY  1980  . COLONOSCOPY N/A 02/20/2017   Procedure: COLONOSCOPY;  Surgeon: Lin Landsman, MD;  Location: [facility] ENDOSCOPY;  Service: Gastroenterology;  Laterality: N/A;  . ESOPHAGOGASTRODUODENOSCOPY N/A 02/20/2017   Procedure:  ESOPHAGOGASTRODUODENOSCOPY (EGD);  Surgeon: Lin Landsman, MD;  Location: [facility] ENDOSCOPY;  Service: Gastroenterology;  Laterality: N/A;  . ESOPHAGOGASTRODUODENOSCOPY (EGD) WITH PROPOFOL N/A 07/08/2017   Procedure: ESOPHAGOGASTRODUODENOSCOPY (EGD) WITH PROPOFOL;  Surgeon: [name], MD;  Location: [facility] ENDOSCOPY;  Service: Endoscopy;  Laterality: N/A;  . [place]  . GIVENS CAPSULE STUDY N/A 07/09/2017   Procedure: GIVENS  CAPSULE STUDY;  Surgeon: Jonathon Bellows, MD;  Location: [facility] ENDOSCOPY;  Service: Gastroenterology;  Laterality: N/A;  . KNEE SURGERY     left  . ROTATOR CUFF REPAIR     right  . TONSILLECTOMY  1950    Prior to Admission medications   Medication Sig Start Date End Date Taking? Authorizing Provider  albuterol (PROVENTIL HFA;VENTOLIN HFA) 108 (90 Base) MCG/ACT inhaler Inhale 2 puffs into the lungs every 6 (six) hours as needed for wheezing or shortness of breath. 10/08/16  Yes Karamalegos, Devonne Doughty, DO  aspirin 325 MG tablet Take 1 tablet (325 mg total) by mouth daily. 08/03/17  Yes Regalado, Belkys A, MD  atorvastatin (LIPITOR) 80 MG tablet Take 1 tablet (80 mg total) by mouth daily at 6 PM. 09/24/16  Yes Karamalegos, Devonne Doughty, DO  colestipol (COLESTID) 1 g tablet Take 2 g by mouth 2 (two) times daily. 10/13/17  Yes [provider]  diclofenac sodium (VOLTAREN) 1 % GEL Apply 2 g topically 4 (four) times daily. 01/11/16  Yes Daymon Larsen, MD  DULoxetine (CYMBALTA) 60 MG capsule Take 1 capsule (60 mg total) by mouth at bedtime. 09/24/16  Yes Karamalegos, Devonne Doughty, DO  ferrous sulfate 325 (65 FE) MG EC tablet Take 1 tablet (325 mg total) by mouth 2 (two) times daily with a meal. Patient taking differently: Take 325 mg by mouth daily.  02/21/17  Yes Sudini, Alveta Heimlich, MD  fluticasone (FLONASE) 50 MCG/ACT nasal spray Place 1 spray into both nostrils daily. 10/08/16  Yes Karamalegos, Devonne Doughty, DO  furosemide (LASIX) 20 MG tablet Take 1 tablet by mouth daily. 11/06/17  Yes [provider]  gabapentin (NEURONTIN) 800 MG tablet Take 1 tablet (800 mg total) by mouth 2 (two) times daily. 09/24/16  Yes Karamalegos, Devonne Doughty, DO  glipiZIDE-metformin (METAGLIP) 5-500 MG tablet Take 1 tablet by mouth 2 (two) times daily before a meal. 04/02/17  Yes Karamalegos, Devonne Doughty, DO  levETIRAcetam (KEPPRA) 500 MG tablet Take 1 tablet (500 mg total) by mouth 2 (two) times daily. 09/19/15  Yes  Epifanio Lesches, MD  levothyroxine (SYNTHROID, LEVOTHROID) 112 MCG tablet Take 1 tablet (112 mcg total) by mouth daily before breakfast. 09/24/17  Yes Vann, Jessica U, DO  lisinopril (PRINIVIL,ZESTRIL) 20 MG tablet Take 20 mg by mouth daily.   Yes [provider]  nitroGLYCERIN (NITROSTAT) 0.4 MG SL tablet Place 1 tablet (0.4 mg total) under the tongue every 5 (five) minutes as needed for chest pain. 02/28/16  Yes Mody, Ulice Bold, MD  omeprazole (PRILOSEC) 20 MG capsule Take 20 mg by mouth 2 (two) times daily. 09/02/17  Yes [provider]  sucralfate (CARAFATE) 1 g tablet TAKE (1) TABLET BY MOUTH FOUR TIMES A DAY 03/26/17  Yes Karamalegos, Devonne Doughty, DO  umeclidinium bromide (INCRUSE ELLIPTA) 62.5 MCG/INH AEPB Inhale 1 puff into the lungs daily. 11/13/15  Yes Karamalegos, Devonne Doughty, DO  vitamin B-12 (CYANOCOBALAMIN) 500 MCG tablet Take 1 tablet by mouth daily.  06/29/17  Yes [provider]  blood glucose meter kit and supplies Dispense based on patient and insurance preference. Use  up to four times daily as directed. (FOR ICD-10 E10.9, E11.9). Patient not taking: Reported on 03/13/2018 08/03/17   Niel Hummer A, MD     Allergies Ivp dye [iodinated diagnostic agents]; Methotrexate; Morphine and related; Mushroom extract complex; Atenolol; Percocet [oxycodone-acetaminophen]; Percodan [oxycodone-aspirin]; Tramadol; Verapamil; and Oxycodone hcl  Family History  Problem Relation Age of Onset  . Breast cancer Mother   . Heart disease Mother   . Stroke Mother   . Cancer Mother   . COPD Mother   . Diabetes Mother   . Heart disease Father   . Diabetes Father   . Stroke Father   . Alcohol abuse Sister   . Drug abuse Sister   . Stroke Sister   . Cancer Sister   . Mental illness Sister   . Heart disease Brother   . Arthritis Brother   . Diabetes Brother   . Heart disease Maternal Grandfather   . Heart disease Paternal Grandfather     Social History Social  History   Tobacco Use  . Smoking status: Current Every Day Smoker    Packs/day: 1.00    Years: 53.00    Pack years: 53.00    Types: Cigarettes  . Smokeless tobacco: Never Used  Substance Use Topics  . Alcohol use: No  . Drug use: No    Review of Systems  Constitutional: No fever/chills Eyes: Hard to keep left eyelid open ENT: No sore throat. Cardiovascular: Denies chest pain. Respiratory: Denies shortness of breath. Gastrointestinal: No abdominal pain.   Genitourinary: Negative for dysuria. Musculoskeletal: Negative for back pain. Skin: Negative for rash. Neurological: As above   ____________________________________________   PHYSICAL EXAM:  VITAL SIGNS: ED Triage Vitals [03/13/18 0823]  Enc Vitals Group     BP 124/73     Pulse Rate (!) 57     Resp 17     Temp (!) 97.5 F (36.4 C)     Temp Source Oral     SpO2 99 %     Weight 71.7 kg (158 lb)     Height 1.549 m ('5\' 1"' )     Head Circumference      Peak Flow      Pain Score 7     Pain Loc      Pain Edu?      Excl. in [place]?     Constitutional: Alert and oriented.  Eyes: Conjunctivae are normal.  PERRLA Head: Atraumatic.  Mouth/Throat: Mucous membranes are moist.   Neck:  Painless ROM Cardiovascular: Normal rate, regular rhythm. Grossly normal heart sounds.  Good peripheral circulation. Respiratory: Normal respiratory effort.  No retractions. Lungs CTAB. Gastrointestinal: Soft and nontender. No distention.   Musculoskeletal: No lower extremity tenderness nor edema.  Warm and well perfused Neurologic:  Normal speech and language.  Left corner of the mouth does appear to be drooping, otherwise cranial nerves are normal, NIH stroke scale of 1 Skin:  Skin is warm, dry and intact. No rash noted. Psychiatric: Mood and affect are normal. Speech and behavior are normal.  ____________________________________________   LABS (all labs ordered are listed, but only abnormal results are displayed)  Labs Reviewed    COMPREHENSIVE METABOLIC PANEL - Abnormal; Notable for the following components:      Result Value   Potassium 2.7 (*)    Glucose, Bld 132 (*)    All other components within normal limits  GLUCOSE, CAPILLARY - Abnormal; Notable for the following components:   Glucose-Capillary 124 (*)  All other components within normal limits  ETHANOL  PROTIME-INR  APTT  CBC  DIFFERENTIAL  URINE DRUG SCREEN, QUALITATIVE ([facility] ONLY)  URINALYSIS, ROUTINE W REFLEX MICROSCOPIC  MAGNESIUM   ____________________________________________  EKG  ED ECG REPORT I, Lavonia Drafts, the attending physician, personally viewed and interpreted this ECG.  Date: 03/13/2018  Rhythm: normal sinus rhythm QRS Axis: normal Intervals: normal ST/T Wave abnormalities: normal Narrative Interpretation: no evidence of acute ischemia  ____________________________________________  RADIOLOGY  CT head negative ____________________________________________   PROCEDURES  Procedure(s) performed: No  Procedures   Critical Care performed: yes  CRITICAL CARE Performed by: Lavonia Drafts   Total critical care time: 30 minutes  Critical care time was exclusive of separately billable procedures and treating other patients.  Critical care was necessary to treat or prevent imminent or life-threatening deterioration.  Critical care was time spent personally by me on the following activities: development of treatment plan with patient and/or surrogate as well as nursing, discussions with consultants, evaluation of patient's response to treatment, examination of patient, obtaining history from patient or surrogate, ordering and performing treatments and interventions, ordering and review of laboratory studies, ordering and review of radiographic studies, pulse oximetry and re-evaluation of patient's condition.  ____________________________________________   INITIAL IMPRESSION / ASSESSMENT AND PLAN / ED  COURSE  Pertinent labs & imaging results that were available during my care of the patient were reviewed by me and considered in my medical decision making (see chart for details).  Patient presents with complaints of onset of headache and left facial droop at 7:15 AM, within the window for TPA.  Likely NIH stroke scale is very low.  Code stroke called, seen by Dr. Doy Mince of neurology who recommends no TPA.  Patient has apparently told her that symptoms may have started earlier.  Dr. Doy Mince does recommend admission to the hospital for MRI further evaluation  Discussed with Dr. salary of hospitalist service for admission    ____________________________________________   FINAL CLINICAL IMPRESSION(S) / ED DIAGNOSES  Final diagnoses:  Cerebrovascular accident (CVA), unspecified mechanism ([place])        Note:  This document was prepared using Dragon voice recognition software and may include unintentional dictation errors.   Lavonia Drafts, MD 03/13/18 1010

## 2018-03-13 NOTE — ED Notes (Signed)
Neurologist at bedside. 

## 2018-03-13 NOTE — ED Triage Notes (Signed)
Pt arrived via EMS for report of frontal headache that started one hour ago - pt has hx of headaches Pt c/o lower back pain - she had xray yesterday and was told that she has a compression fx and needed MRI

## 2018-03-13 NOTE — ED Notes (Signed)
Daughter called this RN back and was informed of admission.

## 2018-03-13 NOTE — ED Notes (Signed)
Attempted to call pt daughter to give update per pt request. No answer.

## 2018-03-13 NOTE — Care Management Obs Status (Signed)
[place] NOTIFICATION   Patient Details  Name: Samantha Aguirre MRN: [id number] Date of Birth: 1945/04/02   Medicare Observation Status Notification Given:  Yes    Gianno Volner A Yarixa Lightcap, RN 03/13/2018, 1:25 PM

## 2018-03-13 NOTE — ED Notes (Signed)
[place] sent to lab

## 2018-03-13 NOTE — ED Notes (Signed)
Date and time results received: 03/13/18 0917   Test: potassium Critical Value: 2.7  Name of Provider Notified: Dr. Corky Downs

## 2018-03-13 NOTE — ED Notes (Addendum)
Pt states 7:15 she began with frontal HA. Went to bed at 8:30 normal. States she woke up through out the night d/t being uncomfortable. States she fell 8 days ago, not yesterday. Had xray performed at [facility] and told compression fracture. Denies hitting head when she fell 8 days ago. States her MD took her off blood thinners. Uses walker. States previous stroke that left her L side weaker than R side. Speech clear. Following commands.

## 2018-03-13 NOTE — Consult Note (Signed)
Referring Physician: Corky Downs    Chief Complaint: Facial droop and headache  HPI: Samantha Aguirre is an 73 y.o. female with a history of stroke with residual left sided weakness/numbness who presents reporting that she awakened this morning and noted that her mouth was drooped and her speech was slurred.  Patient also noted headache as well.  Initial NIHSS of 7.    Date last known well: Date: 03/13/2018 Time last known well: Time: 01:00 tPA Given: No: Minimal symptoms, outside time window, symptoms improving  Past Medical History:  Diagnosis Date  . Acid reflux   . Arthritis    Bilateral knees, hands, feet  . Asthma   . Atrial fibrillation ([place])   . Cancer ([place])   . CHF (congestive heart failure) (H[facility])    Diastolic CHF  . COPD (chronic obstructive pulmonary disease) ([place])   . Coronary artery disease   . Diabetes mellitus without complication ([place])   . Frequent headaches   . GI bleed   . HLD (hyperlipidemia)   . Hypertension   . Seizures ([place])   . Skin cancer 2015   Suspected basal cell carcinoma on nose, surgically removed  . Stroke ([place]) 04/2017  . Thyroid disease   . Tremors of nervous system     Past Surgical History:  Procedure Laterality Date  . ABDOMINAL HYSTERECTOMY  1976  . APPENDECTOMY  1980  . CAROTID ENDARTERECTOMY    . CHOLECYSTECTOMY  1980  . COLONOSCOPY N/A 02/20/2017   Procedure: COLONOSCOPY;  Surgeon: Lin Landsman, MD;  Location: [facility] ENDOSCOPY;  Service: Gastroenterology;  Laterality: N/A;  . ESOPHAGOGASTRODUODENOSCOPY N/A 02/20/2017   Procedure: ESOPHAGOGASTRODUODENOSCOPY (EGD);  Surgeon: Lin Landsman, MD;  Location: [facility] ENDOSCOPY;  Service: Gastroenterology;  Laterality: N/A;  . ESOPHAGOGASTRODUODENOSCOPY (EGD) WITH PROPOFOL N/A 07/08/2017   Procedure: ESOPHAGOGASTRODUODENOSCOPY (EGD) WITH PROPOFOL;  Surgeon: [name], MD;  Location: [facility] ENDOSCOPY;  Service: Endoscopy;  Laterality: N/A;  . [place]  . GIVENS CAPSULE  STUDY N/A 07/09/2017   Procedure: GIVENS CAPSULE STUDY;  Surgeon: Jonathon Bellows, MD;  Location: [facility] ENDOSCOPY;  Service: Gastroenterology;  Laterality: N/A;  . KNEE SURGERY     left  . ROTATOR CUFF REPAIR     right  . TONSILLECTOMY  1950    Family History  Problem Relation Age of Onset  . Breast cancer Mother   . Heart disease Mother   . Stroke Mother   . Cancer Mother   . COPD Mother   . Diabetes Mother   . Heart disease Father   . Diabetes Father   . Stroke Father   . Alcohol abuse Sister   . Drug abuse Sister   . Stroke Sister   . Cancer Sister   . Mental illness Sister   . Heart disease Brother   . Arthritis Brother   . Diabetes Brother   . Heart disease Maternal Grandfather   . Heart disease Paternal Grandfather    Social History:  reports that she has been smoking cigarettes. She has a 53.00 pack-year smoking history. She has never used smokeless tobacco. She reports that she does not drink alcohol or use drugs.  Allergies:  Allergies  Allergen Reactions  . Ivp Dye [Iodinated Diagnostic Agents] Itching    Pt injected with IV contrast.  5 min after injection pt complained of itching behind ear and on her abd.  . Methotrexate Rash  . Morphine And Related Other (See Comments)    "stops my breathing" NEAR-RESPIRATORY ARREST  .  Mushroom Extract Complex Anaphylaxis    Made patient "deathly sick"  . Atenolol Other (See Comments)    "MD took me off of it bc it was doing something wrong" Made patient HYPOTENSIVE  . Percocet [Oxycodone-Acetaminophen] Nausea And Vomiting and Other (See Comments)    Stomach pains  . Percodan [Oxycodone-Aspirin] Nausea And Vomiting and Other (See Comments)    Stomach pains  . Tramadol Nausea Only  . Verapamil Other (See Comments)    " MD told me this was screwing me up" MAKES PATIENT HYPOTENSIVE  . Oxycodone Hcl     Medications: I have reviewed the patient's current medications. Prior to Admission:  Prior to Admission medications    Medication Sig Start Date End Date Taking? Authorizing Provider  albuterol (PROVENTIL HFA;VENTOLIN HFA) 108 (90 Base) MCG/ACT inhaler Inhale 2 puffs into the lungs every 6 (six) hours as needed for wheezing or shortness of breath. 10/08/16   Karamalegos, Devonne Doughty, DO  aspirin 325 MG tablet Take 1 tablet (325 mg total) by mouth daily. 08/03/17   Regalado, Belkys A, MD  atorvastatin (LIPITOR) 80 MG tablet Take 1 tablet (80 mg total) by mouth daily at 6 PM. 09/24/16   Karamalegos, Devonne Doughty, DO  blood glucose meter kit and supplies Dispense based on patient and insurance preference. Use up to four times daily as directed. (FOR ICD-10 E10.9, E11.9). 08/03/17   Regalado, Belkys A, MD  colestipol (COLESTID) 1 g tablet Take 2 g by mouth 2 (two) times daily. 10/13/17   [provider]  diclofenac sodium (VOLTAREN) 1 % GEL Apply 2 g topically 4 (four) times daily. 01/11/16   Daymon Larsen, MD  DULoxetine (CYMBALTA) 60 MG capsule Take 1 capsule (60 mg total) by mouth at bedtime. Patient not taking: Reported on 12/22/2017 09/24/16   Olin Hauser, DO  feeding supplement, GLUCERNA SHAKE, (GLUCERNA SHAKE) LIQD Take 237 mLs by mouth 2 (two) times daily between meals. Patient not taking: Reported on 10/22/2017 08/03/17   Regalado, Jerald Kief A, MD  ferrous sulfate 325 (65 FE) MG EC tablet Take 1 tablet (325 mg total) by mouth 2 (two) times daily with a meal. Patient taking differently: Take 325 mg by mouth daily.  02/21/17   Hillary Bow, MD  fluticasone (FLONASE) 50 MCG/ACT nasal spray Place 1 spray into both nostrils daily. 10/08/16   Karamalegos, Devonne Doughty, DO  furosemide (LASIX) 20 MG tablet Take 1 tablet by mouth daily. 11/06/17   [provider]  gabapentin (NEURONTIN) 800 MG tablet Take 1 tablet (800 mg total) by mouth 2 (two) times daily. 09/24/16   Karamalegos, Devonne Doughty, DO  glipiZIDE-metformin (METAGLIP) 5-500 MG tablet Take 1 tablet by mouth 2 (two) times daily before a  meal. 04/02/17   Karamalegos, Devonne Doughty, DO  levETIRAcetam (KEPPRA) 500 MG tablet Take 1 tablet (500 mg total) by mouth 2 (two) times daily. Patient not taking: Reported on 12/22/2017 09/19/15   Epifanio Lesches, MD  levothyroxine (SYNTHROID, LEVOTHROID) 112 MCG tablet Take 1 tablet (112 mcg total) by mouth daily before breakfast. 09/24/17   Geradine Girt, DO  lisinopril (PRINIVIL,ZESTRIL) 20 MG tablet Take 20 mg by mouth daily.    [provider]  nitroGLYCERIN (NITROSTAT) 0.4 MG SL tablet Place 1 tablet (0.4 mg total) under the tongue every 5 (five) minutes as needed for chest pain. 02/28/16   Bettey Costa, MD  omeprazole (PRILOSEC) 20 MG capsule Take 20 mg by mouth 2 (two) times daily. 09/02/17   [provider]  potassium chloride SA (K-DUR,KLOR-CON) 20 MEQ tablet Take 20 mEq by mouth 2 (two) times daily.    [provider]  sucralfate (CARAFATE) 1 g tablet TAKE (1) TABLET BY MOUTH FOUR TIMES A DAY Patient not taking: Reported on 12/22/2017 03/26/17   Olin Hauser, DO  topiramate (TOPAMAX) 25 MG tablet Take 25 mg by mouth 2 (two) times daily. 10/13/17   [provider]  umeclidinium bromide (INCRUSE ELLIPTA) 62.5 MCG/INH AEPB Inhale 1 puff into the lungs daily. 11/13/15   Karamalegos, Devonne Doughty, DO  vitamin B-12 (CYANOCOBALAMIN) 1000 MCG tablet Take 1 tablet by mouth daily. 06/29/17   [provider]    ROS: History obtained from the patient  General ROS: negative for - chills, fatigue, fever, night sweats, weight gain or weight loss Psychological ROS: negative for - behavioral disorder, hallucinations, memory difficulties, mood swings or suicidal ideation Ophthalmic ROS: blured vision ENT ROS: negative for - epistaxis, nasal discharge, oral lesions, sore throat, tinnitus or vertigo Allergy and Immunology ROS: negative for - hives or itchy/watery eyes Hematological and Lymphatic ROS: negative for - bleeding problems, bruising or  swollen lymph nodes Endocrine ROS: negative for - galactorrhea, hair pattern changes, polydipsia/polyuria or temperature intolerance Respiratory ROS: negative for - cough, hemoptysis, shortness of breath or wheezing Cardiovascular ROS: negative for - chest pain, dyspnea on exertion, edema or irregular heartbeat Gastrointestinal ROS: negative for - abdominal pain, diarrhea, hematemesis, nausea/vomiting or stool incontinence Genito-Urinary ROS: negative for - dysuria, hematuria, incontinence or urinary frequency/urgency Musculoskeletal ROS: negative for - joint swelling or muscular weakness Neurological ROS: as noted in HPI Dermatological ROS: negative for rash and skin lesion changes  Physical Examination: Blood pressure 133/65, pulse (!) 53, temperature (!) 97.5 F (36.4 C), temperature source Oral, resp. rate 20, height '5\' 1"'  (1.549 m), weight 71.7 kg, SpO2 98 %.  HEENT-  Normocephalic, no lesions, without obvious abnormality.  Normal external eye and conjunctiva.  Normal TM's bilaterally.  Normal auditory canals and external ears. Normal external nose, mucus membranes and septum.  Normal pharynx. Cardiovascular- S1, S2 normal, pulses palpable throughout   Lungs- chest clear, no wheezing, rales, normal symmetric air entry Abdomen- soft, non-tender; bowel sounds normal; no masses,  no organomegaly Extremities- mild LE edema Lymph-no adenopathy palpable Musculoskeletal-no joint tenderness, deformity or swelling Skin-warm and dry, no hyperpigmentation, vitiligo, or suspicious lesions  Neurological Examination   Mental Status: Alert, oriented to name but not month or age.  Speech fluent without evidence of aphasia.  Some dysarthria noted.  Able to follow 3 step commands without difficulty. Cranial Nerves: II: Discs flat bilaterally; Visual fields grossly normal, pupils equal, round, reactive to light and accommodation III,IV, VI: ptosis not present, extra-ocular motions intact  bilaterally V,VII: mild left facial droop, facial light touch sensation decreased on the left VIII: hearing normal bilaterally IX,X: gag reflex present XI: bilateral shoulder shrug XII: midline tongue extension Motor: Right : Upper extremity   5/5    Left:     Upper extremity   4+/5  Lower extremity   5/5     Lower extremity   4+/5 Tone and bulk:normal tone throughout; no atrophy noted Sensory: Pinprick and light touch decreased on the left upper and lower extremities Deep Tendon Reflexes: 2+ in the upper extremities, 1+ at the knees and absent at the ankles Plantars: Right: mute   Left: mute Cerebellar: Normal finger-to-nose and normal heel-to-shin testing bilaterally Gait: not tested due to safety concerns   Laboratory  Studies:  Basic Metabolic Panel: Recent Labs  Lab 03/13/18 0826  NA 136  K 2.7*  CL 99  CO2 25  GLUCOSE 132*  BUN 9  CREATININE 0.69  CALCIUM 9.0    Liver Function Tests: Recent Labs  Lab 03/13/18 0826  AST 22  ALT 16  ALKPHOS 123  BILITOT 0.6  PROT 7.4  ALBUMIN 3.9   No results for input(s): LIPASE, AMYLASE in the last 168 hours. No results for input(s): AMMONIA in the last 168 hours.  CBC: Recent Labs  Lab 03/13/18 0826  WBC 7.4  NEUTROABS 4.5  HGB 13.9  HCT 42.3  MCV 87.0  PLT 279    Cardiac Enzymes: No results for input(s): CKTOTAL, CKMB, CKMBINDEX, TROPONINI in the last 168 hours.  BNP: Invalid input(s): POCBNP  CBG: Recent Labs  Lab 03/13/18 0839  GLUCAP 31*    Microbiology: Results for orders placed or performed during the hospital encounter of 11/28/17  Urine Culture     Status: None   Collection Time: 11/28/17  5:11 PM  Result Value Ref Range Status   Specimen Description   Final    URINE, RANDOM Performed at [facility], [address], [place], [place] [address]    Special Requests   Final    Normal Performed at [facility], [address] [place], [place] [address]    Culture    Final    NO GROWTH Performed at [facility], [place] [address] [place], [place] [address]    Report Status 11/30/2017 FINAL  Final    Coagulation Studies: Recent Labs    03/13/18 0826  LABPROT 12.1  INR 0.9    Urinalysis: No results for input(s): COLORURINE, LABSPEC, PHURINE, GLUCOSEU, HGBUR, BILIRUBINUR, KETONESUR, PROTEINUR, UROBILINOGEN, NITRITE, LEUKOCYTESUR in the last 168 hours.  Invalid input(s): APPERANCEUR  Lipid Panel:    Component Value Date/Time   CHOL 142 10/21/2017 0321   TRIG 216 (H) 10/21/2017 0321   HDL 41 10/21/2017 0321   CHOLHDL 3.5 10/21/2017 0321   VLDL 43 (H) 10/21/2017 0321   LDLCALC 58 10/21/2017 0321    HgbA1C:  Lab Results  Component Value Date   HGBA1C 6.5 (H) 10/21/2017    Urine Drug Screen:      Component Value Date/Time   LABOPIA NONE DETECTED 09/22/2017 1940   COCAINSCRNUR NONE DETECTED 09/22/2017 1940   COCAINSCRNUR NONE DETECTED 07/06/2017 1114   LABBENZ NONE DETECTED 09/22/2017 1940   AMPHETMU NONE DETECTED 09/22/2017 1940   THCU NONE DETECTED 09/22/2017 1940   LABBARB NONE DETECTED 09/22/2017 1940    Alcohol Level:  Recent Labs  Lab 03/13/18 0826  ETH <10    Other results: EKG: sinus rhythm at 55 bpm.  Imaging: Ct Head Code Stroke Wo Contrast  Result Date: 03/13/2018 CLINICAL DATA:  Code stroke.  Frontal headache.  Left facial droop. EXAM: CT HEAD WITHOUT CONTRAST TECHNIQUE: Contiguous axial images were obtained from the base of the skull through the vertex without intravenous contrast. COMPARISON:  Head CT and MRI 10/20/2017 FINDINGS: Brain: A chronic moderate-sized infarct is again noted in the left cerebral hemisphere near the junction of the temporal, parietal, and occipital lobes. Small chronic infarcts are again seen in the left greater than right cerebellar hemispheres. No acute infarct, intracranial hemorrhage, midline shift, or extra-axial fluid collection is identified. Patchy to confluent hypodensities in  the cerebral white matter bilaterally are similar to the prior CT and compatible with extensive chronic small vessel ischemic disease.  There is mild cerebral atrophy. A heavily calcified 1 cm extra-axial mass in the right parasagittal frontoparietal vertex region is unchanged and compatible with a meningioma. Vascular: Calcified atherosclerosis at the skull base. No hyperdense vessel. Skull: No fracture or suspicious osseous lesion. Sinuses/Orbits: Visualized paranasal sinuses and mastoid air cells are clear. Visualized orbits are unremarkable. Other: None. ASPECTS [facility] Stroke Program Early CT Score) - Ganglionic level infarction (caudate, lentiform nuclei, internal capsule, insula, M1-M3 cortex): 7 - Supraganglionic infarction (M4-M6 cortex): 3 Total score (0-10 with 10 being normal): 10 IMPRESSION: 1. No evidence of acute intracranial abnormality. 2. ASPECTS is 10. 3. Extensive chronic ischemic changes as above. These results were called by telephone at the time of interpretation on 03/13/2018 at 9:01 am to Dr. Lavonia Drafts , who verbally acknowledged these results. Electronically Signed   By: Logan Bores M.D.   On: 03/13/2018 09:02    Assessment: 73 y.o. female with a history of stroke and atrial fibrillation, not on anticoagulation due to history of GIB, who presents with complaints of left facial droop, slurred speech and headache.  Remainder of left sided symptoms per patient are her baseline.  Symptoms improving.  Patient on ASA at home.  Head CT reviewed and shows no acute changes.  Last stroke work up done in October of last year and was unremarkable.  Further stroke work up recommended.    Stroke Risk Factors - atrial fibrillation, diabetes mellitus, hyperlipidemia, hypertension and smoking  Plan: 1. HgbA1c, fasting lipid panel 2. MRI of the brain without contrast 3. PT consult, OT consult, Speech consult 4. Echocardiogram 5. Carotid dopplers 6. Prophylactic therapy-ASA 39m and Plavix  765mdaily 7. NPO until RN stroke swallow screen 8. Telemetry monitoring 9. Frequent neuro checks 10. Liberal blood pressure.  Would attempt to avoid hypotension  Case discussed with Dr. KiCarma LairMD Neurology [phone]/29/2020, 9:55 AM

## 2018-03-13 NOTE — Progress Notes (Signed)
PT Cancellation Note  Patient Details Name: Samantha Aguirre MRN: [id number] DOB: 1946-01-13   Cancelled Treatment:    Reason Eval/Treat Not Completed: Medical issues which prohibited therapy. Pt has a new compression fracture on imaging, low K+.  Will await further medical guidance on the fracture before PT assesses.   Ramond Dial 03/13/2018, 12:25 PM   Mee Hives, PT MS Acute Rehab Dept. Number: [place] [facility] [place]

## 2018-03-13 NOTE — Progress Notes (Addendum)
SLP Cancellation Note  Patient Details Name: Samantha Aguirre MRN: [id number] DOB: 22-Jun-1945   Cancelled treatment:       Reason Eval/Treat Not Completed: (chart reviewed; pt just admitted to the room from ED) - Admission time to Inpatient status was at ~10:30am. Pt has been assigned to rm 132 per chart notes.  Reviewed provider notes, Neurology notes; awaiting H&P. Per ST notes from 2019, pt has a h/o R CVAs and L MCA w/ residual Left sided weakness including Left facial asymmetry and Aphasia, Dysfluency, and Dysarthria - suspect there could be a degree of these deficits baseline for pt d/t the recency of the last event(s) (~5-6 months ago).  Due to c/o worsening s/s, she was brought to the hospital and admitted. ST services will f/u w/ Cognitive-Linguistic assessment on Monday. Pt appears to be able to communicate wants/needs per NSG notes. She is on a regular diet per MD order.    Orinda Kenner, MS, CCC-SLP Watson,Katherine 03/13/2018, 11:26 AM

## 2018-03-13 NOTE — Progress Notes (Signed)
   03/13/18 0900  Clinical Encounter Type  Visited With Patient  Visit Type ED  Spiritual Encounters  Spiritual Needs Prayer   PG received for a Code Stroke for the patient. She returned from CT and was awake and alert. Chaplain maintained pastoral presence during the neurological exam. After the conclusion of the exam, the chaplain talked with the patient about her recent back injury and family life. The patient requested that the chaplain keep her in prayer, so we did so at that moment. No additional needs at this time, but she will ask her nurse to call or page if necessary.

## 2018-03-13 NOTE — ED Notes (Signed)
ED TO INPATIENT HANDOFF REPORT  ED Nurse Name and Phone #:  Samantha Aguirre Name/Age/Gender [name] 73 y.o. female Room/Bed: ED03A/ED03A  Code Status   Code Status: Prior  Home/SNF/Other Home Patient oriented to: self Is this baseline? Yes   Triage Complete: Triage complete  Chief Complaint Back pain  Triage Note Pt arrived via EMS for report of frontal headache that started one hour ago - pt has hx of headaches Pt c/o lower back pain - she had xray yesterday and was told that she has a compression fx and needed MRI   Allergies Allergies  Allergen Reactions  . Ivp Dye [Iodinated Diagnostic Agents] Itching    Pt injected with IV contrast.  5 min after injection pt complained of itching behind ear and on her abd.  . Methotrexate Rash  . Morphine And Related Other (See Comments)    "stops my breathing" NEAR-RESPIRATORY ARREST  . Mushroom Extract Complex Anaphylaxis    Made patient "deathly sick"  . Atenolol Other (See Comments)    "MD took me off of it bc it was doing something wrong" Made patient HYPOTENSIVE  . Percocet [Oxycodone-Acetaminophen] Nausea And Vomiting and Other (See Comments)    Stomach pains  . Percodan [Oxycodone-Aspirin] Nausea And Vomiting and Other (See Comments)    Stomach pains  . Tramadol Nausea Only  . Verapamil Other (See Comments)    " MD told me this was screwing me up" MAKES PATIENT HYPOTENSIVE  . Oxycodone Hcl     Level of Care/Admitting Diagnosis ED Disposition    ED Disposition Condition [facility]: [place] [[id number]]  Level of Care: Med-Surg [16]  Diagnosis: Speaking difficulty [093267]  Admitting Physician: Loletha Grayer [[id number]]  Attending Physician: Loletha Grayer [phone]  PT Class (Do Not Modify): Observation [104]  PT Acc Code (Do Not Modify): Observation [10022]       B Medical/Surgery History Past Medical History:  Diagnosis Date  . Acid reflux   .  Arthritis    Bilateral knees, hands, feet  . Asthma   . Atrial fibrillation ([place])   . Cancer ([place])   . CHF (congestive heart failure) ([facility])    Diastolic CHF  . COPD (chronic obstructive pulmonary disease) ([place])   . Coronary artery disease   . Diabetes mellitus without complication ([place])   . Frequent headaches   . GI bleed   . HLD (hyperlipidemia)   . Hypertension   . Seizures ([place])   . Skin cancer 2015   Suspected basal cell carcinoma on nose, surgically removed  . Stroke ([place]) 04/2017  . Thyroid disease   . Tremors of nervous system    Past Surgical History:  Procedure Laterality Date  . ABDOMINAL HYSTERECTOMY  1976  . APPENDECTOMY  1980  . CAROTID ENDARTERECTOMY    . CHOLECYSTECTOMY  1980  . COLONOSCOPY N/A 02/20/2017   Procedure: COLONOSCOPY;  Surgeon: Lin Landsman, MD;  Location: [facility] ENDOSCOPY;  Service: Gastroenterology;  Laterality: N/A;  . ESOPHAGOGASTRODUODENOSCOPY N/A 02/20/2017   Procedure: ESOPHAGOGASTRODUODENOSCOPY (EGD);  Surgeon: Lin Landsman, MD;  Location: [facility] ENDOSCOPY;  Service: Gastroenterology;  Laterality: N/A;  . ESOPHAGOGASTRODUODENOSCOPY (EGD) WITH PROPOFOL N/A 07/08/2017   Procedure: ESOPHAGOGASTRODUODENOSCOPY (EGD) WITH PROPOFOL;  Surgeon: [name], MD;  Location: [facility] ENDOSCOPY;  Service: Endoscopy;  Laterality: N/A;  . [place]  . GIVENS CAPSULE STUDY N/A 07/09/2017   Procedure: GIVENS CAPSULE STUDY;  Surgeon: Jonathon Bellows, MD;  Location:  [facility] ENDOSCOPY;  Service: Gastroenterology;  Laterality: N/A;  . KNEE SURGERY     left  . ROTATOR CUFF REPAIR     right  . TONSILLECTOMY  1950     A IV Location/Drains/Wounds Patient Lines/Drains/Airways Status   Active Line/Drains/Airways    Name:   Placement date:   Placement time:   Site:   Days:   Peripheral IV 03/13/18 Right Antecubital   03/13/18    0828    Antecubital   less than 1          Intake/Output Last 24 hours No intake or output data in the 24 hours  ending 03/13/18 1014  Labs/Imaging Results for orders placed or performed during the hospital encounter of 03/13/18 (from the past 48 hour(s))  Ethanol     Status: None   Collection Time: 03/13/18  8:26 AM  Result Value Ref Range   Alcohol, Ethyl (B) <10 <10 mg/dL    Comment: (NOTE) Lowest detectable limit for serum alcohol is 10 mg/dL. For medical purposes only. Performed at [facility], [address] [place], [place] [address]   Protime-INR     Status: None   Collection Time: 03/13/18  8:26 AM  Result Value Ref Range   Prothrombin Time 12.1 11.4 - 15.2 seconds   INR 0.9 0.8 - 1.2    Comment: (NOTE) INR goal varies based on device and disease states. Performed at [facility], [address] [place], [place] [address]   APTT     Status: None   Collection Time: 03/13/18  8:26 AM  Result Value Ref Range   aPTT 27 24 - 36 seconds    Comment: Performed at [facility], [address] [place], [place] [address]  CBC     Status: None   Collection Time: 03/13/18  8:26 AM  Result Value Ref Range   WBC 7.4 4.0 - 10.5 K/uL   RBC 4.86 3.87 - 5.11 MIL/uL   Hemoglobin 13.9 12.0 - 15.0 g/dL   HCT 42.3 36.0 - 46.0 %   MCV 87.0 80.0 - 100.0 fL   MCH 28.6 26.0 - 34.0 pg   MCHC 32.9 30.0 - 36.0 g/dL   RDW 13.3 11.5 - 15.5 %   Platelets 279 150 - 400 K/uL   nRBC 0.0 0.0 - 0.2 %    Comment: Performed at [facility], [address] [place], [place] [address]  Differential     Status: None   Collection Time: 03/13/18  8:26 AM  Result Value Ref Range   Neutrophils Relative % 61 %   Neutro Abs 4.5 1.7 - 7.7 K/uL   Lymphocytes Relative 31 %   Lymphs Abs 2.3 0.7 - 4.0 K/uL   Monocytes Relative 6 %   Monocytes Absolute 0.5 0.1 - 1.0 K/uL   Eosinophils Relative 1 %   Eosinophils Absolute 0.1 0.0 - 0.5 K/uL   Basophils Relative 1 %   Basophils Absolute 0.1 0.0 - 0.1 K/uL   Immature Granulocytes 0 %   Abs Immature Granulocytes 0.02 0.00 -  0.07 K/uL    Comment: Performed at [facility], [address] [place], [place] [address]  Comprehensive metabolic panel     Status: Abnormal   Collection Time: 03/13/18  8:26 AM  Result Value Ref Range   Sodium 136 135 - 145 mmol/L   Potassium 2.7 (LL) 3.5 - 5.1 mmol/L    Comment: CRITICAL RESULT CALLED TO, READ BACK BY AND  VERIFIED WITH KATE Odessie Polzin ON 03/13/2018 AT 0916 QSD    Chloride 99 98 - 111 mmol/L   CO2 25 22 - 32 mmol/L   Glucose, Bld 132 (H) 70 - 99 mg/dL   BUN 9 8 - 23 mg/dL   Creatinine, Ser 0.69 0.44 - 1.00 mg/dL   Calcium 9.0 8.9 - 10.3 mg/dL   Total Protein 7.4 6.5 - 8.1 g/dL   Albumin 3.9 3.5 - 5.0 g/dL   AST 22 15 - 41 U/L   ALT 16 0 - 44 U/L   Alkaline Phosphatase 123 38 - 126 U/L   Total Bilirubin 0.6 0.3 - 1.2 mg/dL   GFR calc non Af Amer >60 >60 mL/min   GFR calc Af Amer >60 >60 mL/min   Anion gap 12 5 - 15    Comment: Performed at [facility], [address] [place], [place] [address]  Glucose, capillary     Status: Abnormal   Collection Time: 03/13/18  8:39 AM  Result Value Ref Range   Glucose-Capillary 124 (H) 70 - 99 mg/dL   Ct Head Code Stroke Wo Contrast  Result Date: 03/13/2018 CLINICAL DATA:  Code stroke.  Frontal headache.  Left facial droop. EXAM: CT HEAD WITHOUT CONTRAST TECHNIQUE: Contiguous axial images were obtained from the base of the skull through the vertex without intravenous contrast. COMPARISON:  Head CT and MRI 10/20/2017 FINDINGS: Brain: A chronic moderate-sized infarct is again noted in the left cerebral hemisphere near the junction of the temporal, parietal, and occipital lobes. Small chronic infarcts are again seen in the left greater than right cerebellar hemispheres. No acute infarct, intracranial hemorrhage, midline shift, or extra-axial fluid collection is identified. Patchy to confluent hypodensities in the cerebral white matter bilaterally are similar to the prior CT and compatible with extensive chronic  small vessel ischemic disease. There is mild cerebral atrophy. A heavily calcified 1 cm extra-axial mass in the right parasagittal frontoparietal vertex region is unchanged and compatible with a meningioma. Vascular: Calcified atherosclerosis at the skull base. No hyperdense vessel. Skull: No fracture or suspicious osseous lesion. Sinuses/Orbits: Visualized paranasal sinuses and mastoid air cells are clear. Visualized orbits are unremarkable. Other: None. ASPECTS [facility] Stroke Program Early CT Score) - Ganglionic level infarction (caudate, lentiform nuclei, internal capsule, insula, M1-M3 cortex): 7 - Supraganglionic infarction (M4-M6 cortex): 3 Total score (0-10 with 10 being normal): 10 IMPRESSION: 1. No evidence of acute intracranial abnormality. 2. ASPECTS is 10. 3. Extensive chronic ischemic changes as above. These results were called by telephone at the time of interpretation on 03/13/2018 at 9:01 am to Dr. Lavonia Drafts , who verbally acknowledged these results. Electronically Signed   By: Logan Bores M.D.   On: 03/13/2018 09:02    Pending Labs Unresulted Labs (From admission, onward)    Start     Ordered   03/13/18 1010  Magnesium  Add-on,   AD     03/13/18 1010   03/13/18 0836  Urine Drug Screen, Qualitative  Once,   STAT     03/13/18 0836   03/13/18 0836  Urinalysis, Routine w reflex microscopic  ONCE - STAT,   STAT     03/13/18 0836          Vitals/Pain Today's Vitals   03/13/18 0823 03/13/18 0900 03/13/18 0930 03/13/18 1000  BP: 124/73 (!) 134/59 133/65 112/73  Pulse: (!) 57 (!) 51 (!) 53   Resp: 17 17 20    Temp: (!) 97.5 F (36.4 C)  TempSrc: Oral     SpO2: 99% 100% 98%   Weight: 71.7 kg     Height: 5\' 1"  (1.549 m)     PainSc: 7        Isolation Precautions No active isolations  Medications Medications  potassium chloride SA (K-DUR,KLOR-CON) CR tablet 40 mEq (has no administration in time range)    Mobility walks with device Moderate fall risk   Focused  Assessments    R Recommendations: See Admitting Provider Note  Report given to:   Additional Notes:  Compression fx from a week ago

## 2018-03-13 NOTE — ED Notes (Signed)
Pt ambulated to toilet with EDT Mickel Baas. Pt provided sandwich tray and ginger ale, ok per MD.

## 2018-03-13 NOTE — ED Notes (Signed)
Admitting MD at bedside.

## 2018-03-14 ENCOUNTER — Observation Stay
Admit: 2018-03-14 | Discharge: 2018-03-14 | Disposition: A | Discharge status: Home / Self Care | Payer: Medicare Other | Attending: Internal Medicine | Admitting: Internal Medicine

## 2018-03-14 DIAGNOSIS — R29810 Facial weakness: Secondary | ICD-10-CM | POA: Diagnosis not present

## 2018-03-14 DIAGNOSIS — R197 Diarrhea, unspecified: Secondary | ICD-10-CM | POA: Diagnosis not present

## 2018-03-14 DIAGNOSIS — G459 Transient cerebral ischemic attack, unspecified: Secondary | ICD-10-CM | POA: Diagnosis not present

## 2018-03-14 DIAGNOSIS — I959 Hypotension, unspecified: Secondary | ICD-10-CM | POA: Diagnosis not present

## 2018-03-14 DIAGNOSIS — E876 Hypokalemia: Secondary | ICD-10-CM | POA: Diagnosis not present

## 2018-03-14 DIAGNOSIS — I6389 Other cerebral infarction: Secondary | ICD-10-CM | POA: Diagnosis not present

## 2018-03-14 LAB — GLUCOSE, CAPILLARY
GLUCOSE-CAPILLARY: 107 mg/dL — AB (ref 70–99)
Glucose-Capillary: 125 mg/dL — ABNORMAL HIGH (ref 70–99)
Glucose-Capillary: 119 mg/dL — ABNORMAL HIGH (ref 70–99)
Glucose-Capillary: 134 mg/dL — ABNORMAL HIGH (ref 70–99)

## 2018-03-14 LAB — BASIC METABOLIC PANEL
Anion gap: 8 (ref 5–15)
BUN: 11 mg/dL (ref 8–23)
CO2: 25 mmol/L (ref 22–32)
Calcium: 8.4 mg/dL — ABNORMAL LOW (ref 8.9–10.3)
Chloride: 103 mmol/L (ref 98–111)
Creatinine, Ser: 0.6 mg/dL (ref 0.44–1.00)
GFR calc Af Amer: >60 mL/min (ref >60)
GFR calc non Af Amer: >60 mL/min (ref >60)
Glucose, Bld: 117 mg/dL — ABNORMAL HIGH (ref 70–99)
Potassium: 4.4 mmol/L (ref 3.5–5.1)
SODIUM: 136 mmol/L (ref 135–145)

## 2018-03-14 LAB — CBC
HCT: 37.4 % (ref 36.0–46.0)
Hemoglobin: 12.1 g/dL (ref 12.0–15.0)
MCH: 28.7 pg (ref 26.0–34.0)
MCHC: 32.4 g/dL (ref 30.0–36.0)
MCV: 88.6 fL (ref 80.0–100.0)
Platelets: 244 10*3/uL (ref 150–400)
RBC: 4.22 MIL/uL (ref 3.87–5.11)
RDW: 13.7 % (ref 11.5–15.5)
WBC: 6.6 10*3/uL (ref 4.0–10.5)
nRBC: 0 % (ref 0.0–0.2)

## 2018-03-14 LAB — LIPID PANEL
CHOLESTEROL: 143 mg/dL (ref 0–200)
HDL: 32 mg/dL — ABNORMAL LOW (ref >40)
LDL Cholesterol: 73 mg/dL (ref 0–99)
Total CHOL/HDL Ratio: 4.5 RATIO
Triglycerides: 192 mg/dL — ABNORMAL HIGH (ref <150)
VLDL: 38 mg/dL (ref 0–40)

## 2018-03-14 LAB — ECHOCARDIOGRAM COMPLETE
Height: 61 in
Weight: 2496 oz

## 2018-03-14 NOTE — Consult Note (Signed)
ORTHOPAEDIC CONSULTATION  REQUESTING PHYSICIAN: Salary, Avel Peace, MD  Chief Complaint: Low back pain with L1 compression fracture  HPI: Samantha Aguirre is a 73 y.o. female who complains of low back pain.  She has a history of stroke affecting the left side of her body and affecting her speech.  Patient had plain films of the thoracic spine and MR of the lumbar spine yesterday upon admission to [facility].  The thoracic films show slight progression of L1 compression fracture compared to CT exam on 07/15/17.  The MRI demonstrated an acute on chronic L5 compression fracture with 40% height loss, progressive degenerative changes at L3-4 with mild to moderate left neural foraminal stenosis.  New bilateral facet edema at L4-5.  Past Medical History:  Diagnosis Date  . Acid reflux   . Arthritis    Bilateral knees, hands, feet  . Asthma   . Atrial fibrillation ([place])   . Cancer ([place])   . CHF (congestive heart failure) (HCC)    Diastolic CHF  . COPD (chronic obstructive pulmonary disease) ([place])   . Coronary artery disease   . Diabetes mellitus without complication ([place])   . Frequent headaches   . GI bleed   . HLD (hyperlipidemia)   . Hypertension   . Seizures ([place])   . Skin cancer 2015   Suspected basal cell carcinoma on nose, surgically removed  . Stroke ([place]) 04/2017  . Thyroid disease   . Tremors of nervous system    Past Surgical History:  Procedure Laterality Date  . ABDOMINAL HYSTERECTOMY  1976  . APPENDECTOMY  1980  . CAROTID ENDARTERECTOMY    . CHOLECYSTECTOMY  1980  . COLONOSCOPY N/A 02/20/2017   Procedure: COLONOSCOPY;  Surgeon: Lin Landsman, MD;  Location: [facility] ENDOSCOPY;  Service: Gastroenterology;  Laterality: N/A;  . ESOPHAGOGASTRODUODENOSCOPY N/A 02/20/2017   Procedure: ESOPHAGOGASTRODUODENOSCOPY (EGD);  Surgeon: Lin Landsman, MD;  Location: [facility] ENDOSCOPY;  Service: Gastroenterology;  Laterality: N/A;  . ESOPHAGOGASTRODUODENOSCOPY (EGD) WITH PROPOFOL N/A  07/08/2017   Procedure: ESOPHAGOGASTRODUODENOSCOPY (EGD) WITH PROPOFOL;  Surgeon: [name], MD;  Location: [facility] ENDOSCOPY;  Service: Endoscopy;  Laterality: N/A;  . [place]  . GIVENS CAPSULE STUDY N/A 07/09/2017   Procedure: GIVENS CAPSULE STUDY;  Surgeon: Jonathon Bellows, MD;  Location: [facility] ENDOSCOPY;  Service: Gastroenterology;  Laterality: N/A;  . KNEE SURGERY     left  . ROTATOR CUFF REPAIR     right  . TONSILLECTOMY  1950   Social History   Socioeconomic History  . Marital status: Divorced    Spouse name: Not on file  . Number of children: 2  . Years of education: 27  . Highest education level: 12th grade  Occupational History  . Occupation: Disabled  Social Needs  . Financial resource strain: Somewhat hard  . Food insecurity:    Worry: Sometimes true    Inability: Sometimes true  . Transportation needs:    Medical: No    Non-medical: No  Tobacco Use  . Smoking status: Current Every Day Smoker    Packs/day: 1.00    Years: 53.00    Pack years: 53.00    Types: Cigarettes  . Smokeless tobacco: Never Used  Substance and Sexual Activity  . Alcohol use: No  . Drug use: No  . Sexual activity: Not Currently  Lifestyle  . Physical activity:    Days per week: 2 days    Minutes per session: 20 min  . Stress: Rather much  Relationships  .  Social connections:    Talks on phone: More than three times a week    Gets together: More than three times a week    Attends religious service: Never    Active member of club or organization: No    Attends meetings of clubs or organizations: Never    Relationship status: Divorced  Other Topics Concern  . Not on file  Social History Narrative   None   Family History  Problem Relation Age of Onset  . Breast cancer Mother   . Heart disease Mother   . Stroke Mother   . Cancer Mother   . COPD Mother   . Diabetes Mother   . Heart disease Father   . Diabetes Father   . Stroke Father   . Alcohol abuse Sister    . Drug abuse Sister   . Stroke Sister   . Cancer Sister   . Mental illness Sister   . Heart disease Brother   . Arthritis Brother   . Diabetes Brother   . Heart disease Maternal Grandfather   . Heart disease Paternal Grandfather    Allergies  Allergen Reactions  . Ivp Dye [Iodinated Diagnostic Agents] Itching    Pt injected with IV contrast.  5 min after injection pt complained of itching behind ear and on her abd.  . Methotrexate Rash  . Morphine And Related Other (See Comments)    "stops my breathing" NEAR-RESPIRATORY ARREST  . Mushroom Extract Complex Anaphylaxis    Made patient "deathly sick"  . Atenolol Other (See Comments)    "MD took me off of it bc it was doing something wrong" Made patient HYPOTENSIVE  . Percocet [Oxycodone-Acetaminophen] Nausea And Vomiting and Other (See Comments)    Stomach pains  . Percodan [Oxycodone-Aspirin] Nausea And Vomiting and Other (See Comments)    Stomach pains  . Tramadol Nausea Only  . Verapamil Other (See Comments)    " MD told me this was screwing me up" MAKES PATIENT HYPOTENSIVE  . Oxycodone Hcl    Prior to Admission medications   Medication Sig Start Date End Date Taking? Authorizing Provider  albuterol (PROVENTIL HFA;VENTOLIN HFA) 108 (90 Base) MCG/ACT inhaler Inhale 2 puffs into the lungs every 6 (six) hours as needed for wheezing or shortness of breath. 10/08/16  Yes Karamalegos, Devonne Doughty, DO  aspirin 325 MG tablet Take 1 tablet (325 mg total) by mouth daily. 08/03/17  Yes Regalado, Belkys A, MD  atorvastatin (LIPITOR) 80 MG tablet Take 1 tablet (80 mg total) by mouth daily at 6 PM. 09/24/16  Yes Karamalegos, Devonne Doughty, DO  colestipol (COLESTID) 1 g tablet Take 2 g by mouth 2 (two) times daily. 10/13/17  Yes [provider]  diclofenac sodium (VOLTAREN) 1 % GEL Apply 2 g topically 4 (four) times daily. 01/11/16  Yes Daymon Larsen, MD  DULoxetine (CYMBALTA) 60 MG capsule Take 1 capsule (60 mg total) by mouth at  bedtime. 09/24/16  Yes Karamalegos, Devonne Doughty, DO  ferrous sulfate 325 (65 FE) MG EC tablet Take 1 tablet (325 mg total) by mouth 2 (two) times daily with a meal. Patient taking differently: Take 325 mg by mouth daily.  02/21/17  Yes Sudini, Alveta Heimlich, MD  fluticasone (FLONASE) 50 MCG/ACT nasal spray Place 1 spray into both nostrils daily. 10/08/16  Yes Karamalegos, Devonne Doughty, DO  furosemide (LASIX) 20 MG tablet Take 1 tablet by mouth daily. 11/06/17  Yes [provider]  gabapentin (NEURONTIN) 800 MG tablet Take 1  tablet (800 mg total) by mouth 2 (two) times daily. 09/24/16  Yes Karamalegos, Devonne Doughty, DO  glipiZIDE-metformin (METAGLIP) 5-500 MG tablet Take 1 tablet by mouth 2 (two) times daily before a meal. 04/02/17  Yes Karamalegos, Devonne Doughty, DO  levETIRAcetam (KEPPRA) 500 MG tablet Take 1 tablet (500 mg total) by mouth 2 (two) times daily. 09/19/15  Yes Epifanio Lesches, MD  levothyroxine (SYNTHROID, LEVOTHROID) 112 MCG tablet Take 1 tablet (112 mcg total) by mouth daily before breakfast. 09/24/17  Yes Vann, Jessica U, DO  lisinopril (PRINIVIL,ZESTRIL) 20 MG tablet Take 20 mg by mouth daily.   Yes [provider]  nitroGLYCERIN (NITROSTAT) 0.4 MG SL tablet Place 1 tablet (0.4 mg total) under the tongue every 5 (five) minutes as needed for chest pain. 02/28/16  Yes Mody, Ulice Bold, MD  omeprazole (PRILOSEC) 20 MG capsule Take 20 mg by mouth 2 (two) times daily. 09/02/17  Yes [provider]  sucralfate (CARAFATE) 1 g tablet TAKE (1) TABLET BY MOUTH FOUR TIMES A DAY 03/26/17  Yes Karamalegos, Devonne Doughty, DO  umeclidinium bromide (INCRUSE ELLIPTA) 62.5 MCG/INH AEPB Inhale 1 puff into the lungs daily. 11/13/15  Yes Karamalegos, Devonne Doughty, DO  vitamin B-12 (CYANOCOBALAMIN) 500 MCG tablet Take 1 tablet by mouth daily.  06/29/17  Yes [provider]  blood glucose meter kit and supplies Dispense based on patient and insurance preference. Use up to four times daily as  directed. (FOR ICD-10 E10.9, E11.9). Patient not taking: Reported on 03/13/2018 08/03/17   Elmarie Shiley, MD   Dg Thoracic Spine 2 View  Result Date: 03/13/2018 CLINICAL DATA:  Low back pain for several days EXAM: THORACIC SPINE 2 VIEWS COMPARISON:  None. FINDINGS: Vertebral body height is well maintained. L1 compression fracture is noted slightly progressed when compared with the prior CT examination from 07/15/2017. Multilevel osteophytic changes are seen. The pedicles are within normal limits. No paraspinal mass is noted. Calcified granuloma is noted in the left lung. IMPRESSION: L1 compression deformity somewhat progressed when compared with the prior exam. Degenerative changes of the thoracic spine without acute abnormality. Electronically Signed   By: Inez Catalina M.D.   On: 03/13/2018 11:05   [name]  Result Date: 03/13/2018 CLINICAL DATA:  Headache and left facial droop. EXAM: MRI HEAD WITHOUT CONTRAST TECHNIQUE: Multiplanar, multiecho pulse sequences of the brain and surrounding structures were obtained without intravenous contrast. COMPARISON:  Head CT 03/13/2018 and MRI 10/20/2017 FINDINGS: Multiple sequences are mildly motion degraded. Brain: There is no evidence of acute infarct, midline shift, or extra-axial fluid collection. A chronic moderate-sized posterior left MCA territory infarct and multiple small chronic bilateral cerebellar infarcts are again noted. A chronic infarct involving the genu of the corpus callosum on the right is new from the prior MRI. Patchy to confluent T2 hyperintensities throughout the cerebral white matter bilaterally elsewhere are similar to the prior MRI and compatible with extensive chronic small vessel ischemic disease. Cerebral atrophy is unchanged from the prior MRI. A 1 cm calcified extra-axial mass in the right parasagittal frontoparietal vertex region is unchanged and compatible with a meningioma without associated edema. Vascular: Major  intracranial vascular flow voids are preserved. Skull and upper cervical spine: Unremarkable bone marrow signal. Sinuses/Orbits: Unremarkable orbits. Paranasal sinuses and mastoid air cells are clear. Other: None. IMPRESSION: 1. No acute intracranial abnormality. 2. Extensive chronic ischemia as above. Interval chronic infarct in the corpus callosum since 10/2017. Electronically Signed   By: Seymour Bars.D.  On: 03/13/2018 12:45   Mr Lumbar Spine Wo Contrast  Result Date: 03/13/2018 CLINICAL DATA:  Low back pain. New L5 compression fracture on recent radiographs. EXAM: MRI LUMBAR SPINE WITHOUT CONTRAST TECHNIQUE: Multiplanar, multisequence MR imaging of the lumbar spine was performed. No intravenous contrast was administered. COMPARISON:  Report from lumbar spine radiographs 03/11/2018. Lumbar spine MRI 06/19/2014. FINDINGS: Segmentation:  Standard. Alignment:  Minimal chronic anterolisthesis of L4 on L5. Vertebrae: Chronic L1 superior endplate compression fracture with unchanged mild vertebral body height loss compared to the prior MRI and no marrow edema. Chronic L5 inferior endplate compression deformity with new L5 superior endplate fracture. 40% L5 vertebral body height loss and moderate marrow edema. New marrow edema about the right greater than left L4-5 facet joints favored to be degenerative. No suspicious osseous lesion. Conus medullaris and cauda equina: Conus extends to the upper L2 level. Conus and cauda equina appear normal. Paraspinal and other soft tissues: 3 mm T2 hyperintense lesion in the right kidney, likely a cyst. Disc levels: Disc desiccation throughout the lumbar and lower thoracic spine. T11-12: Only imaged sagittally. Disc bulging and facet hypertrophy result in mild spinal stenosis without significant neural foraminal stenosis, unchanged. T12-L1: Disc bulging, minimal L1 superior endplate retropulsion, and mild facet hypertrophy result in mild spinal stenosis and minimal bilateral  neural foraminal narrowing, unchanged. L1-2: Mild facet hypertrophy without disc herniation or stenosis, unchanged. L2-3: Minimal disc bulging, mild facet and ligamentum flavum hypertrophy, and congenitally short pedicles result in borderline to mild spinal stenosis without neural foraminal stenosis, unchanged. L3-4: Disc bulging greater to the left, moderate facet and ligamentum flavum hypertrophy, and congenitally short pedicles result in mild spinal stenosis, mild bilateral lateral recess stenosis, and borderline right and mild-to-moderate left neural foraminal stenosis, slightly progressed. L4-5: Anterolisthesis with bulging uncovered disc, congenitally short pedicles, and severe facet hypertrophy result in mild spinal stenosis, mild bilateral lateral recess stenosis, and moderate right and mild left neural foraminal stenosis, unchanged. L5-S1: A small central disc protrusion is new but does not result in spinal or lateral recess stenosis. Disc bulging and moderate to severe facet hypertrophy result in mild bilateral neural foraminal stenosis, unchanged. IMPRESSION: 1. Acute on chronic L5 compression fracture with 40% height loss. 2. Slightly progressive degenerative changes at L3-4 with mild spinal and mild-to-moderate left neural foraminal stenosis. 3. New small central disc protrusion at L5-S1 without associated stenosis. 4. New bilateral facet edema at L4-5. Electronically Signed   By: Logan Bores M.D.   On: 03/13/2018 12:59   US Carotid Bilateral (at [facility] Only)  Result Date: 03/13/2018 CLINICAL DATA:  Facial droop EXAM: BILATERAL CAROTID DUPLEX ULTRASOUND TECHNIQUE: Pearline Cables scale imaging, color Doppler and duplex ultrasound were performed of bilateral carotid and vertebral arteries in the neck. COMPARISON:  None. FINDINGS: Criteria: Quantification of carotid stenosis is based on velocity parameters that correlate the residual internal carotid diameter with NASCET-based stenosis levels, using the  diameter of the distal internal carotid lumen as the denominator for stenosis measurement. The following velocity measurements were obtained: RIGHT ICA: 78 cm/sec CCA: 56 cm/sec SYSTOLIC ICA/CCA RATIO:  1.4 ECA: 78 cm/sec LEFT ICA: 70 cm/sec CCA: 68 cm/sec SYSTOLIC ICA/CCA RATIO:  1.0 ECA: 66 cm/sec RIGHT CAROTID ARTERY: Minimal soft smooth plaque in the bulb. Low resistance internal carotid Doppler pattern. RIGHT VERTEBRAL ARTERY:  Antegrade. LEFT CAROTID ARTERY: Mild irregular calcified plaque in the mid and upper common carotid artery. Mild irregular calcified plaque in the bulb. Low resistance internal carotid Doppler  pattern is preserved. LEFT VERTEBRAL ARTERY:  Antegrade. IMPRESSION: Less than 50% stenosis in the right and left internal carotid arteries. Electronically Signed   By: Marybelle Killings M.D.   On: 03/13/2018 13:24   Ct Head Code Stroke Wo Contrast  Result Date: 03/13/2018 CLINICAL DATA:  Code stroke.  Frontal headache.  Left facial droop. EXAM: CT HEAD WITHOUT CONTRAST TECHNIQUE: Contiguous axial images were obtained from the base of the skull through the vertex without intravenous contrast. COMPARISON:  Head CT and MRI 10/20/2017 FINDINGS: Brain: A chronic moderate-sized infarct is again noted in the left cerebral hemisphere near the junction of the temporal, parietal, and occipital lobes. Small chronic infarcts are again seen in the left greater than right cerebellar hemispheres. No acute infarct, intracranial hemorrhage, midline shift, or extra-axial fluid collection is identified. Patchy to confluent hypodensities in the cerebral white matter bilaterally are similar to the prior CT and compatible with extensive chronic small vessel ischemic disease. There is mild cerebral atrophy. A heavily calcified 1 cm extra-axial mass in the right parasagittal frontoparietal vertex region is unchanged and compatible with a meningioma. Vascular: Calcified atherosclerosis at the skull base. No hyperdense  vessel. Skull: No fracture or suspicious osseous lesion. Sinuses/Orbits: Visualized paranasal sinuses and mastoid air cells are clear. Visualized orbits are unremarkable. Other: None. ASPECTS [facility] Stroke Program Early CT Score) - Ganglionic level infarction (caudate, lentiform nuclei, internal capsule, insula, M1-M3 cortex): 7 - Supraganglionic infarction (M4-M6 cortex): 3 Total score (0-10 with 10 being normal): 10 IMPRESSION: 1. No evidence of acute intracranial abnormality. 2. ASPECTS is 10. 3. Extensive chronic ischemic changes as above. These results were called by telephone at the time of interpretation on 03/13/2018 at 9:01 am to Dr. Lavonia Drafts , who verbally acknowledged these results. Electronically Signed   By: Logan Bores M.D.   On: 03/13/2018 09:02    Positive ROS: All other systems have been reviewed and were otherwise negative with the exception of those mentioned in the HPI and as above.  Physical Exam: General: Alert, no acute distress  MUSCULOSKELETAL: Patient sitting up in bed.  She has intact sensation to light touch in both lower extremities.  She has decreased sensation of the left side compared to the right but this is likely due to her previous stroke and does not sound like an acute change for the patient.  Patient has intact motor function in the lower extremities without obvious weakness.  She has palpable pedal pulses.    Assessment: 1.  Low back pain 2.  Progressive L1 compression fracture with 40 % height loss 3.  Acute on chronic L5 compression fracture 4.  L3-4 foraminal stenosis 5.  L4-5 facet edema  Plan: I have reviewed radiographic findings with patient.   She is complaining of significant low back pain in settting of L1 and L5 compression fractures.   Her stroke likely is etiology of the diminished sensation of the left lower extremity although foraminal stenoisis may be contributing.    Recommend:  #1)  Lumbosacral corset when out of bed. #2)  PT  evaluation #3)  Evaluation by Dr. Hessie Knows tomorrow, if patient has pain with PT, to determine if patient is a candidate for kyphoplasty.    Thornton Park, MD    03/14/2018 2:46 PM

## 2018-03-14 NOTE — Progress Notes (Signed)
Family Meeting Note  Advance Directive:yes  Today a meeting took place with the Patient.  Patient is able to participate   The following clinical team members were present during this meeting:MD  The following were discussed:Patient's diagnosis: Speech difficulty, L1 fracture, diarrhea, Patient's progosis: Unable to determine and Goals for treatment: DNR  Additional follow-up to be provided: prn  Time spent during discussion:20 minutes  Gorden Harms, MD

## 2018-03-14 NOTE — Progress Notes (Signed)
Subjective: Patient back to baseline.  Only complaint today is that of pain.    Objective: Current vital signs: BP 107/65   Pulse (!) 54   Temp 97.9 F (36.6 C) (Oral)   Resp 17   Ht 5\' 1"  (1.549 m)   Wt 70.8 kg   SpO2 98%   BMI 29.48 kg/m  Vital signs in last 24 hours: Temp:  [97.3 F (36.3 C)-98.3 F (36.8 C)] 97.9 F (36.6 C) (03/01 0455) Pulse Rate:  [51-59] 54 (03/01 0853) Resp:  [16-20] 17 (03/01 0853) BP: (95-133)/(56-73) 107/65 (03/01 0853) SpO2:  [92 %-100 %] 98 % (03/01 0853) Weight:  [70.8 kg] 70.8 kg (02/29 1105)  Intake/Output from previous day: 02/29 0701 - 03/01 0700 In: 104.1 [I.V.:62.7; IV Piggyback:41.4] Out: -  Intake/Output this shift: No intake/output data recorded. Nutritional status:  Diet Order            Diet heart healthy/carb modified Room service appropriate? Yes; Fluid consistency: Thin  Diet effective now              Neurologic Exam: Mental Status: Alert, oriented, thought content appropriate.  Speech fluent without evidence of aphasia.  Able to follow 3 step commands without difficulty. Cranial Nerves: II: Discs flat bilaterally; Visual fields grossly normal, pupils equal, round, reactive to light and accommodation III,IV, VI: ptosis not present, extra-ocular motions intact bilaterally V,VII: mild left facial droop, facial light touch sensation decreased on the left VIII: hearing normal bilaterally IX,X: gag reflex present XI: bilateral shoulder shrug XII: midline tongue extension Motor: Right : Upper extremity   5/5    Left:     Upper extremity   5-/5  Lower extremity   5/5     Lower extremity   5-/5 Tone and bulk:normal tone throughout; no atrophy noted Sensory: Pinprick and light touch decreased on the left   Lab Results: Basic Metabolic Panel: Recent Labs  Lab 03/13/18 0826 03/14/18 0522  NA 136 136  K 2.7* 4.4  CL 99 103  CO2 25 25  GLUCOSE 132* 117*  BUN 9 11  CREATININE 0.69 0.60  CALCIUM 9.0 8.4*  MG 1.8   --     Liver Function Tests: Recent Labs  Lab 03/13/18 0826  AST 22  ALT 16  ALKPHOS 123  BILITOT 0.6  PROT 7.4  ALBUMIN 3.9   No results for input(s): LIPASE, AMYLASE in the last 168 hours. No results for input(s): AMMONIA in the last 168 hours.  CBC: Recent Labs  Lab 03/13/18 0826 03/14/18 0522  WBC 7.4 6.6  NEUTROABS 4.5  --   HGB 13.9 12.1  HCT 42.3 37.4  MCV 87.0 88.6  PLT 279 244    Cardiac Enzymes: No results for input(s): CKTOTAL, CKMB, CKMBINDEX, TROPONINI in the last 168 hours.  Lipid Panel: Recent Labs  Lab 03/14/18 0522  CHOL 143  TRIG 192*  HDL 32*  CHOLHDL 4.5  VLDL 38  LDLCALC 73    CBG: Recent Labs  Lab 03/13/18 0839 03/13/18 1655 03/13/18 2034 03/14/18 0749  GLUCAP 124* 136* 100* 125*    Microbiology: Results for orders placed or performed during the hospital encounter of 03/13/18  Gastrointestinal Panel by PCR , Stool     Status: None   Collection Time: 03/13/18 11:19 AM  Result Value Ref Range Status   Campylobacter species NOT DETECTED NOT DETECTED Final   Plesimonas shigelloides NOT DETECTED NOT DETECTED Final   Salmonella species NOT DETECTED NOT DETECTED Final  Yersinia enterocolitica NOT DETECTED NOT DETECTED Final   Vibrio species NOT DETECTED NOT DETECTED Final   Vibrio cholerae NOT DETECTED NOT DETECTED Final   Enteroaggregative E coli (EAEC) NOT DETECTED NOT DETECTED Final   Enteropathogenic E coli (EPEC) NOT DETECTED NOT DETECTED Final   Enterotoxigenic E coli (ETEC) NOT DETECTED NOT DETECTED Final   Shiga like toxin producing E coli (STEC) NOT DETECTED NOT DETECTED Final   Shigella/Enteroinvasive E coli (EIEC) NOT DETECTED NOT DETECTED Final   Cryptosporidium NOT DETECTED NOT DETECTED Final   Cyclospora cayetanensis NOT DETECTED NOT DETECTED Final   Entamoeba histolytica NOT DETECTED NOT DETECTED Final   Giardia lamblia NOT DETECTED NOT DETECTED Final   Adenovirus F40/41 NOT DETECTED NOT DETECTED Final    Astrovirus NOT DETECTED NOT DETECTED Final   Norovirus GI/GII NOT DETECTED NOT DETECTED Final   Rotavirus A NOT DETECTED NOT DETECTED Final   Sapovirus (I, II, IV, and V) NOT DETECTED NOT DETECTED Final    Comment: Performed at [facility], [address] [place], [place] [address]  C difficile quick scan w PCR reflex     Status: None   Collection Time: 03/13/18 11:19 AM  Result Value Ref Range Status   C Diff antigen NEGATIVE NEGATIVE Final   C Diff toxin NEGATIVE NEGATIVE Final   C Diff interpretation No C. difficile detected.  Final    Comment: Performed at [facility], [address] [place], [place] [address]    Coagulation Studies: Recent Labs    03/13/18 0826  LABPROT 12.1  INR 0.9    Imaging: Dg Thoracic Spine 2 View  Result Date: 03/13/2018 CLINICAL DATA:  Low back pain for several days EXAM: THORACIC SPINE 2 VIEWS COMPARISON:  None. FINDINGS: Vertebral body height is well maintained. L1 compression fracture is noted slightly progressed when compared with the prior CT examination from 07/15/2017. Multilevel osteophytic changes are seen. The pedicles are within normal limits. No paraspinal mass is noted. Calcified granuloma is noted in the left lung. IMPRESSION: L1 compression deformity somewhat progressed when compared with the prior exam. Degenerative changes of the thoracic spine without acute abnormality. Electronically Signed   By: Inez Catalina M.D.   On: 03/13/2018 11:05   [name]  Result Date: 03/13/2018 CLINICAL DATA:  Headache and left facial droop. EXAM: MRI HEAD WITHOUT CONTRAST TECHNIQUE: Multiplanar, multiecho pulse sequences of the brain and surrounding structures were obtained without intravenous contrast. COMPARISON:  Head CT 03/13/2018 and MRI 10/20/2017 FINDINGS: Multiple sequences are mildly motion degraded. Brain: There is no evidence of acute infarct, midline shift, or extra-axial fluid collection. A chronic moderate-sized  posterior left MCA territory infarct and multiple small chronic bilateral cerebellar infarcts are again noted. A chronic infarct involving the genu of the corpus callosum on the right is new from the prior MRI. Patchy to confluent T2 hyperintensities throughout the cerebral white matter bilaterally elsewhere are similar to the prior MRI and compatible with extensive chronic small vessel ischemic disease. Cerebral atrophy is unchanged from the prior MRI. A 1 cm calcified extra-axial mass in the right parasagittal frontoparietal vertex region is unchanged and compatible with a meningioma without associated edema. Vascular: Major intracranial vascular flow voids are preserved. Skull and upper cervical spine: Unremarkable bone marrow signal. Sinuses/Orbits: Unremarkable orbits. Paranasal sinuses and mastoid air cells are clear. Other: None. IMPRESSION: 1. No acute intracranial abnormality. 2. Extensive chronic ischemia as above. Interval chronic infarct in the corpus callosum since 10/2017. Electronically Signed  By: Logan Bores M.D.   On: 03/13/2018 12:45   Mr Lumbar Spine Wo Contrast  Result Date: 03/13/2018 CLINICAL DATA:  Low back pain. New L5 compression fracture on recent radiographs. EXAM: MRI LUMBAR SPINE WITHOUT CONTRAST TECHNIQUE: Multiplanar, multisequence MR imaging of the lumbar spine was performed. No intravenous contrast was administered. COMPARISON:  Report from lumbar spine radiographs 03/11/2018. Lumbar spine MRI 06/19/2014. FINDINGS: Segmentation:  Standard. Alignment:  Minimal chronic anterolisthesis of L4 on L5. Vertebrae: Chronic L1 superior endplate compression fracture with unchanged mild vertebral body height loss compared to the prior MRI and no marrow edema. Chronic L5 inferior endplate compression deformity with new L5 superior endplate fracture. 40% L5 vertebral body height loss and moderate marrow edema. New marrow edema about the right greater than left L4-5 facet joints favored to  be degenerative. No suspicious osseous lesion. Conus medullaris and cauda equina: Conus extends to the upper L2 level. Conus and cauda equina appear normal. Paraspinal and other soft tissues: 3 mm T2 hyperintense lesion in the right kidney, likely a cyst. Disc levels: Disc desiccation throughout the lumbar and lower thoracic spine. T11-12: Only imaged sagittally. Disc bulging and facet hypertrophy result in mild spinal stenosis without significant neural foraminal stenosis, unchanged. T12-L1: Disc bulging, minimal L1 superior endplate retropulsion, and mild facet hypertrophy result in mild spinal stenosis and minimal bilateral neural foraminal narrowing, unchanged. L1-2: Mild facet hypertrophy without disc herniation or stenosis, unchanged. L2-3: Minimal disc bulging, mild facet and ligamentum flavum hypertrophy, and congenitally short pedicles result in borderline to mild spinal stenosis without neural foraminal stenosis, unchanged. L3-4: Disc bulging greater to the left, moderate facet and ligamentum flavum hypertrophy, and congenitally short pedicles result in mild spinal stenosis, mild bilateral lateral recess stenosis, and borderline right and mild-to-moderate left neural foraminal stenosis, slightly progressed. L4-5: Anterolisthesis with bulging uncovered disc, congenitally short pedicles, and severe facet hypertrophy result in mild spinal stenosis, mild bilateral lateral recess stenosis, and moderate right and mild left neural foraminal stenosis, unchanged. L5-S1: A small central disc protrusion is new but does not result in spinal or lateral recess stenosis. Disc bulging and moderate to severe facet hypertrophy result in mild bilateral neural foraminal stenosis, unchanged. IMPRESSION: 1. Acute on chronic L5 compression fracture with 40% height loss. 2. Slightly progressive degenerative changes at L3-4 with mild spinal and mild-to-moderate left neural foraminal stenosis. 3. New small central disc protrusion  at L5-S1 without associated stenosis. 4. New bilateral facet edema at L4-5. Electronically Signed   By: Logan Bores M.D.   On: 03/13/2018 12:59   US Carotid Bilateral (at [facility] Only)  Result Date: 03/13/2018 CLINICAL DATA:  Facial droop EXAM: BILATERAL CAROTID DUPLEX ULTRASOUND TECHNIQUE: Pearline Cables scale imaging, color Doppler and duplex ultrasound were performed of bilateral carotid and vertebral arteries in the neck. COMPARISON:  None. FINDINGS: Criteria: Quantification of carotid stenosis is based on velocity parameters that correlate the residual internal carotid diameter with NASCET-based stenosis levels, using the diameter of the distal internal carotid lumen as the denominator for stenosis measurement. The following velocity measurements were obtained: RIGHT ICA: 78 cm/sec CCA: 56 cm/sec SYSTOLIC ICA/CCA RATIO:  1.4 ECA: 78 cm/sec LEFT ICA: 70 cm/sec CCA: 68 cm/sec SYSTOLIC ICA/CCA RATIO:  1.0 ECA: 66 cm/sec RIGHT CAROTID ARTERY: Minimal soft smooth plaque in the bulb. Low resistance internal carotid Doppler pattern. RIGHT VERTEBRAL ARTERY:  Antegrade. LEFT CAROTID ARTERY: Mild irregular calcified plaque in the mid and upper common carotid artery. Mild irregular calcified plaque in  the bulb. Low resistance internal carotid Doppler pattern is preserved. LEFT VERTEBRAL ARTERY:  Antegrade. IMPRESSION: Less than 50% stenosis in the right and left internal carotid arteries. Electronically Signed   By: Marybelle Killings M.D.   On: 03/13/2018 13:24   Ct Head Code Stroke Wo Contrast  Result Date: 03/13/2018 CLINICAL DATA:  Code stroke.  Frontal headache.  Left facial droop. EXAM: CT HEAD WITHOUT CONTRAST TECHNIQUE: Contiguous axial images were obtained from the base of the skull through the vertex without intravenous contrast. COMPARISON:  Head CT and MRI 10/20/2017 FINDINGS: Brain: A chronic moderate-sized infarct is again noted in the left cerebral hemisphere near the junction of the temporal, parietal, and  occipital lobes. Small chronic infarcts are again seen in the left greater than right cerebellar hemispheres. No acute infarct, intracranial hemorrhage, midline shift, or extra-axial fluid collection is identified. Patchy to confluent hypodensities in the cerebral white matter bilaterally are similar to the prior CT and compatible with extensive chronic small vessel ischemic disease. There is mild cerebral atrophy. A heavily calcified 1 cm extra-axial mass in the right parasagittal frontoparietal vertex region is unchanged and compatible with a meningioma. Vascular: Calcified atherosclerosis at the skull base. No hyperdense vessel. Skull: No fracture or suspicious osseous lesion. Sinuses/Orbits: Visualized paranasal sinuses and mastoid air cells are clear. Visualized orbits are unremarkable. Other: None. ASPECTS [facility] Stroke Program Early CT Score) - Ganglionic level infarction (caudate, lentiform nuclei, internal capsule, insula, M1-M3 cortex): 7 - Supraganglionic infarction (M4-M6 cortex): 3 Total score (0-10 with 10 being normal): 10 IMPRESSION: 1. No evidence of acute intracranial abnormality. 2. ASPECTS is 10. 3. Extensive chronic ischemic changes as above. These results were called by telephone at the time of interpretation on 03/13/2018 at 9:01 am to Dr. Lavonia Drafts , who verbally acknowledged these results. Electronically Signed   By: Logan Bores M.D.   On: 03/13/2018 09:02    Medications:  I have reviewed the patient's current medications. Scheduled: . aspirin  325 mg Oral Daily  . atorvastatin  80 mg Oral q1800  . capsicum oleoresin   Topical BID  . colestipol  2 g Oral BID  . DULoxetine  60 mg Oral QHS  . enoxaparin (LOVENOX) injection  40 mg Subcutaneous Q24H  . fluticasone  1 spray Each Nare Daily  . gabapentin  800 mg Oral BID  . insulin aspart  0-5 Units Subcutaneous QHS  . insulin aspart  0-9 Units Subcutaneous TID WC  . levETIRAcetam  500 mg Oral BID  . levothyroxine  112 mcg  Oral QAC breakfast  . pantoprazole  40 mg Oral Daily  . sucralfate  1 g Oral TID WC & HS  . umeclidinium bromide  1 puff Inhalation Daily  . vitamin B-12  500 mcg Oral Daily    Assessment/Plan: 73 year old female presenting with left facial droop, slurred speech and headache.  Symptoms resolved today.  MRI of the brain reviewed and shows no acute changes.  Carotid dopplers show no evidence of hemodynamically significant stenosis.  Echocardiogram pending.  A1c 6.0, LDL 73.  Recommendations: 1. Patient encouraged to stop smoking but is unwilling at this time 2. Aggressive lipid management with target LDL<70. 3. ASA 81mg  and Plavix 75mg  to be continued for one month.  Patient to then return to ASA daily alone 4. Echocardiogram pending 5. No further neurologic intervention is recommended at this time.  If further questions arise, please call or page at that time.  Thank you for allowing  neurology to participate in the care of this patient.  Patient to follow up with neurology on an outpatient basis   LOS: 0 days   Alexis Goodell, MD Neurology [phone] 03/14/2018  9:22 AM

## 2018-03-14 NOTE — Progress Notes (Addendum)
[facility] [place] NAME: Samantha Aguirre    MR#:  [id number]  DATE OF BIRTH:  02/03/1945  SUBJECTIVE:  CHIEF COMPLAINT:   Complains of back pain and the continued problems with speech, neurology input appreciated, orthopedic surgery to see regarding worsening L1 compression fracture  REVIEW OF SYSTEMS:  CONSTITUTIONAL: No fever, fatigue or weakness.  EYES: No blurred or double vision.  EARS, NOSE, AND THROAT: No tinnitus or ear pain.  RESPIRATORY: No cough, shortness of breath, wheezing or hemoptysis.  CARDIOVASCULAR: No chest pain, orthopnea, edema.  GASTROINTESTINAL: No nausea, vomiting, diarrhea or abdominal pain.  GENITOURINARY: No dysuria, hematuria.  ENDOCRINE: No polyuria, nocturia,  HEMATOLOGY: No anemia, easy bruising or bleeding SKIN: No rash or lesion. MUSCULOSKELETAL: No joint pain or arthritis.   NEUROLOGIC: No tingling, numbness, weakness.  PSYCHIATRY: No anxiety or depression.   ROS  DRUG ALLERGIES:   Allergies  Allergen Reactions  . Ivp Dye [Iodinated Diagnostic Agents] Itching    Pt injected with IV contrast.  5 min after injection pt complained of itching behind ear and on her abd.  . Methotrexate Rash  . Morphine And Related Other (See Comments)    "stops my breathing" NEAR-RESPIRATORY ARREST  . Mushroom Extract Complex Anaphylaxis    Made patient "deathly sick"  . Atenolol Other (See Comments)    "MD took me off of it bc it was doing something wrong" Made patient HYPOTENSIVE  . Percocet [Oxycodone-Acetaminophen] Nausea And Vomiting and Other (See Comments)    Stomach pains  . Percodan [Oxycodone-Aspirin] Nausea And Vomiting and Other (See Comments)    Stomach pains  . Tramadol Nausea Only  . Verapamil Other (See Comments)    " MD told me this was screwing me up" MAKES PATIENT HYPOTENSIVE  . Oxycodone Hcl     VITALS:  Blood pressure 107/65, pulse (!) 54, temperature 97.9 F (36.6 C), temperature  source Oral, resp. rate 17, height 5\' 1"  (1.549 m), weight 70.8 kg, SpO2 98 %.  PHYSICAL EXAMINATION:  GENERAL:  73 y.o.-year-old patient lying in the bed with no acute distress.  EYES: Pupils equal, round, reactive to light and accommodation. No scleral icterus. Extraocular muscles intact.  HEENT: Head atraumatic, normocephalic. Oropharynx and nasopharynx clear.  NECK:  Supple, no jugular venous distention. No thyroid enlargement, no tenderness.  LUNGS: Normal breath sounds bilaterally, no wheezing, rales,rhonchi or crepitation. No use of accessory muscles of respiration.  CARDIOVASCULAR: S1, S2 normal. No murmurs, rubs, or gallops.  ABDOMEN: Soft, nontender, nondistended. Bowel sounds present. No organomegaly or mass.  EXTREMITIES: No pedal edema, cyanosis, or clubbing.  NEUROLOGIC: Cranial nerves II through XII are intact. Muscle strength 5/5 in all extremities. Sensation intact. Gait not checked.  PSYCHIATRIC: The patient is alert and oriented x 3.  SKIN: No obvious rash, lesion, or ulcer.   Physical Exam LABORATORY PANEL:   CBC Recent Labs  Lab 03/14/18 0522  WBC 6.6  HGB 12.1  HCT 37.4  PLT 244   ------------------------------------------------------------------------------------------------------------------  Chemistries  Recent Labs  Lab 03/13/18 0826 03/14/18 0522  NA 136 136  K 2.7* 4.4  CL 99 103  CO2 25 25  GLUCOSE 132* 117*  BUN 9 11  CREATININE 0.69 0.60  CALCIUM 9.0 8.4*  MG 1.8  --   AST 22  --   ALT 16  --   ALKPHOS 123  --   BILITOT 0.6  --    ------------------------------------------------------------------------------------------------------------------  Cardiac Enzymes No results  for input(s): TROPONINI in the last 168 hours. ------------------------------------------------------------------------------------------------------------------  RADIOLOGY:  Dg Thoracic Spine 2 View  Result Date: 03/13/2018 CLINICAL DATA:  Low back pain for  several days EXAM: THORACIC SPINE 2 VIEWS COMPARISON:  None. FINDINGS: Vertebral body height is well maintained. L1 compression fracture is noted slightly progressed when compared with the prior CT examination from 07/15/2017. Multilevel osteophytic changes are seen. The pedicles are within normal limits. No paraspinal mass is noted. Calcified granuloma is noted in the left lung. IMPRESSION: L1 compression deformity somewhat progressed when compared with the prior exam. Degenerative changes of the thoracic spine without acute abnormality. Electronically Signed   By: Inez Catalina M.D.   On: 03/13/2018 11:05   [name]  Result Date: 03/13/2018 CLINICAL DATA:  Headache and left facial droop. EXAM: MRI HEAD WITHOUT CONTRAST TECHNIQUE: Multiplanar, multiecho pulse sequences of the brain and surrounding structures were obtained without intravenous contrast. COMPARISON:  Head CT 03/13/2018 and MRI 10/20/2017 FINDINGS: Multiple sequences are mildly motion degraded. Brain: There is no evidence of acute infarct, midline shift, or extra-axial fluid collection. A chronic moderate-sized posterior left MCA territory infarct and multiple small chronic bilateral cerebellar infarcts are again noted. A chronic infarct involving the genu of the corpus callosum on the right is new from the prior MRI. Patchy to confluent T2 hyperintensities throughout the cerebral white matter bilaterally elsewhere are similar to the prior MRI and compatible with extensive chronic small vessel ischemic disease. Cerebral atrophy is unchanged from the prior MRI. A 1 cm calcified extra-axial mass in the right parasagittal frontoparietal vertex region is unchanged and compatible with a meningioma without associated edema. Vascular: Major intracranial vascular flow voids are preserved. Skull and upper cervical spine: Unremarkable bone marrow signal. Sinuses/Orbits: Unremarkable orbits. Paranasal sinuses and mastoid air cells are clear. Other:  None. IMPRESSION: 1. No acute intracranial abnormality. 2. Extensive chronic ischemia as above. Interval chronic infarct in the corpus callosum since 10/2017. Electronically Signed   By: Logan Bores M.D.   On: 03/13/2018 12:45   Mr Lumbar Spine Wo Contrast  Result Date: 03/13/2018 CLINICAL DATA:  Low back pain. New L5 compression fracture on recent radiographs. EXAM: MRI LUMBAR SPINE WITHOUT CONTRAST TECHNIQUE: Multiplanar, multisequence MR imaging of the lumbar spine was performed. No intravenous contrast was administered. COMPARISON:  Report from lumbar spine radiographs 03/11/2018. Lumbar spine MRI 06/19/2014. FINDINGS: Segmentation:  Standard. Alignment:  Minimal chronic anterolisthesis of L4 on L5. Vertebrae: Chronic L1 superior endplate compression fracture with unchanged mild vertebral body height loss compared to the prior MRI and no marrow edema. Chronic L5 inferior endplate compression deformity with new L5 superior endplate fracture. 40% L5 vertebral body height loss and moderate marrow edema. New marrow edema about the right greater than left L4-5 facet joints favored to be degenerative. No suspicious osseous lesion. Conus medullaris and cauda equina: Conus extends to the upper L2 level. Conus and cauda equina appear normal. Paraspinal and other soft tissues: 3 mm T2 hyperintense lesion in the right kidney, likely a cyst. Disc levels: Disc desiccation throughout the lumbar and lower thoracic spine. T11-12: Only imaged sagittally. Disc bulging and facet hypertrophy result in mild spinal stenosis without significant neural foraminal stenosis, unchanged. T12-L1: Disc bulging, minimal L1 superior endplate retropulsion, and mild facet hypertrophy result in mild spinal stenosis and minimal bilateral neural foraminal narrowing, unchanged. L1-2: Mild facet hypertrophy without disc herniation or stenosis, unchanged. L2-3: Minimal disc bulging, mild facet and ligamentum flavum hypertrophy, and congenitally  short  pedicles result in borderline to mild spinal stenosis without neural foraminal stenosis, unchanged. L3-4: Disc bulging greater to the left, moderate facet and ligamentum flavum hypertrophy, and congenitally short pedicles result in mild spinal stenosis, mild bilateral lateral recess stenosis, and borderline right and mild-to-moderate left neural foraminal stenosis, slightly progressed. L4-5: Anterolisthesis with bulging uncovered disc, congenitally short pedicles, and severe facet hypertrophy result in mild spinal stenosis, mild bilateral lateral recess stenosis, and moderate right and mild left neural foraminal stenosis, unchanged. L5-S1: A small central disc protrusion is new but does not result in spinal or lateral recess stenosis. Disc bulging and moderate to severe facet hypertrophy result in mild bilateral neural foraminal stenosis, unchanged. IMPRESSION: 1. Acute on chronic L5 compression fracture with 40% height loss. 2. Slightly progressive degenerative changes at L3-4 with mild spinal and mild-to-moderate left neural foraminal stenosis. 3. New small central disc protrusion at L5-S1 without associated stenosis. 4. New bilateral facet edema at L4-5. Electronically Signed   By: Logan Bores M.D.   On: 03/13/2018 12:59   US Carotid Bilateral (at [facility] Only)  Result Date: 03/13/2018 CLINICAL DATA:  Facial droop EXAM: BILATERAL CAROTID DUPLEX ULTRASOUND TECHNIQUE: Pearline Cables scale imaging, color Doppler and duplex ultrasound were performed of bilateral carotid and vertebral arteries in the neck. COMPARISON:  None. FINDINGS: Criteria: Quantification of carotid stenosis is based on velocity parameters that correlate the residual internal carotid diameter with NASCET-based stenosis levels, using the diameter of the distal internal carotid lumen as the denominator for stenosis measurement. The following velocity measurements were obtained: RIGHT ICA: 78 cm/sec CCA: 56 cm/sec SYSTOLIC ICA/CCA RATIO:  1.4  ECA: 78 cm/sec LEFT ICA: 70 cm/sec CCA: 68 cm/sec SYSTOLIC ICA/CCA RATIO:  1.0 ECA: 66 cm/sec RIGHT CAROTID ARTERY: Minimal soft smooth plaque in the bulb. Low resistance internal carotid Doppler pattern. RIGHT VERTEBRAL ARTERY:  Antegrade. LEFT CAROTID ARTERY: Mild irregular calcified plaque in the mid and upper common carotid artery. Mild irregular calcified plaque in the bulb. Low resistance internal carotid Doppler pattern is preserved. LEFT VERTEBRAL ARTERY:  Antegrade. IMPRESSION: Less than 50% stenosis in the right and left internal carotid arteries. Electronically Signed   By: Marybelle Killings M.D.   On: 03/13/2018 13:24   Ct Head Code Stroke Wo Contrast  Result Date: 03/13/2018 CLINICAL DATA:  Code stroke.  Frontal headache.  Left facial droop. EXAM: CT HEAD WITHOUT CONTRAST TECHNIQUE: Contiguous axial images were obtained from the base of the skull through the vertex without intravenous contrast. COMPARISON:  Head CT and MRI 10/20/2017 FINDINGS: Brain: A chronic moderate-sized infarct is again noted in the left cerebral hemisphere near the junction of the temporal, parietal, and occipital lobes. Small chronic infarcts are again seen in the left greater than right cerebellar hemispheres. No acute infarct, intracranial hemorrhage, midline shift, or extra-axial fluid collection is identified. Patchy to confluent hypodensities in the cerebral white matter bilaterally are similar to the prior CT and compatible with extensive chronic small vessel ischemic disease. There is mild cerebral atrophy. A heavily calcified 1 cm extra-axial mass in the right parasagittal frontoparietal vertex region is unchanged and compatible with a meningioma. Vascular: Calcified atherosclerosis at the skull base. No hyperdense vessel. Skull: No fracture or suspicious osseous lesion. Sinuses/Orbits: Visualized paranasal sinuses and mastoid air cells are clear. Visualized orbits are unremarkable. Other: None. ASPECTS [facility] Stroke  Program Early CT Score) - Ganglionic level infarction (caudate, lentiform nuclei, internal capsule, insula, M1-M3 cortex): 7 - Supraganglionic infarction (M4-M6 cortex): 3 Total score (  0-10 with 10 being normal): 10 IMPRESSION: 1. No evidence of acute intracranial abnormality. 2. ASPECTS is 10. 3. Extensive chronic ischemic changes as above. These results were called by telephone at the time of interpretation on 03/13/2018 at 9:01 am to Dr. Lavonia Drafts , who verbally acknowledged these results. Electronically Signed   By: Logan Bores M.D.   On: 03/13/2018 09:02    ASSESSMENT AND PLAN:  *Acute Left facial droop, eyelid droop, problems with speech Appears resolved Continue CVA/TIA protocol, neurology input appreciated, MRI of the brain/carotid Dopplers negative, follow-up on echocardiogram, continue aspirin, statin therapy Patient is hesitant on increasing anticoagulation with GI bleed last year with AVMs.  PT/OT/speech therapy to evaluate/treat  *Acute worsening L1 compression fracture Consult orthopedic surgery/Dr. Geanie Cooley to evaluate further-possible need for vertebroplasty  *Hypokalemia Repleted  *Diarrhea  Resolved  GI panel negative  *Relative hypotension Normal blood pressures noted Check orthostatics Continue to hold antihypertensive agents  *Chronic diabetes mellitus type 2 Controlled Continue heart healthy/carbohydrate consistent diet, sliding scale insulin with Accu-Cheks per routine  *Epilepsy Stable Continue seizure precautions Keppra   *Chronic hypothyroidism, unspecified  Stable  Continue levothyroxine  *Chronic hyperlipidemia, unspecified Stable Continue statin therapy  *Chronic tobacco smoker abuse/dependency Nicotine patch and cessation counseling ordered  Disposition Home in 1 to 2 days with neurology and orthopedic surgery clearance  All the records are reviewed and case discussed with Care Management/Social Workerr. Management plans discussed  with the patient, family and they are in agreement.  CODE STATUS: dnr  TOTAL TIME TAKING CARE OF THIS PATIENT: 40 minutes.   POSSIBLE D/C IN 1-3 DAYS, DEPENDING ON CLINICAL CONDITION.   Avel Peace Evoleth Nordmeyer M.D on 03/14/2018   Between 7am to 6pm - Pager - [phone]  After 6pm go to [email or web address] - password [email or web address] [facility]  Office  [phone]  CC: Primary care physician; Tracie Harrier, MD  Note: This dictation was prepared with Dragon dictation along with smaller phrase technology. Any transcriptional errors that result from this process are unintentional.

## 2018-03-14 NOTE — Progress Notes (Signed)
PT Cancellation Note  Patient Details Name: Daphnee Preiss MRN: [id number] DOB: [birth year]   Cancelled Treatment:    Reason Eval/Treat Not Completed: Pain limiting ability to participate;Medical issues which prohibited therapy- patient has new L5 compression fx. Ortho consult pending. Evaluate when appropriate.    Rony Ratz 03/14/2018, 12:33 PM

## 2018-03-14 NOTE — Plan of Care (Signed)
  Problem: Education: Goal: Knowledge of disease or condition will improve Outcome: Progressing Goal: Knowledge of secondary prevention will improve Outcome: Progressing Goal: Knowledge of patient specific risk factors addressed and post discharge goals established will improve Outcome: Progressing   Problem: Coping: Goal: Will verbalize positive feelings about self Outcome: Progressing Goal: Will identify appropriate support needs Outcome: Progressing   Problem: Nutrition: Goal: Risk of aspiration will decrease Outcome: Progressing   Problem: Education: Goal: Knowledge of General Education information will improve Description Including pain rating scale, medication(s)/side effects and non-pharmacologic comfort measures Outcome: Progressing   Problem: Clinical Measurements: Goal: Ability to maintain clinical measurements within normal limits will improve Outcome: Progressing Goal: Will remain free from infection Outcome: Progressing Goal: Diagnostic test results will improve Outcome: Progressing Goal: Respiratory complications will improve Outcome: Progressing Goal: Cardiovascular complication will be avoided Outcome: Progressing   Problem: Pain Managment: Goal: General experience of comfort will improve Outcome: Progressing   Problem: Safety: Goal: Ability to remain free from injury will improve Outcome: Progressing

## 2018-03-15 DIAGNOSIS — Z981 Arthrodesis status: Secondary | ICD-10-CM | POA: Diagnosis not present

## 2018-03-15 DIAGNOSIS — E039 Hypothyroidism, unspecified: Secondary | ICD-10-CM | POA: Diagnosis present

## 2018-03-15 DIAGNOSIS — I69354 Hemiplegia and hemiparesis following cerebral infarction affecting left non-dominant side: Secondary | ICD-10-CM | POA: Diagnosis not present

## 2018-03-15 DIAGNOSIS — Z66 Do not resuscitate: Secondary | ICD-10-CM | POA: Diagnosis present

## 2018-03-15 DIAGNOSIS — I480 Paroxysmal atrial fibrillation: Secondary | ICD-10-CM | POA: Diagnosis present

## 2018-03-15 DIAGNOSIS — J441 Chronic obstructive pulmonary disease with (acute) exacerbation: Secondary | ICD-10-CM | POA: Diagnosis not present

## 2018-03-15 DIAGNOSIS — S79912A Unspecified injury of left hip, initial encounter: Secondary | ICD-10-CM | POA: Diagnosis not present

## 2018-03-15 DIAGNOSIS — I959 Hypotension, unspecified: Secondary | ICD-10-CM | POA: Diagnosis not present

## 2018-03-15 DIAGNOSIS — I5032 Chronic diastolic (congestive) heart failure: Secondary | ICD-10-CM | POA: Diagnosis present

## 2018-03-15 DIAGNOSIS — M4856XA Collapsed vertebra, not elsewhere classified, lumbar region, initial encounter for fracture: Secondary | ICD-10-CM | POA: Diagnosis not present

## 2018-03-15 DIAGNOSIS — I509 Heart failure, unspecified: Secondary | ICD-10-CM | POA: Diagnosis not present

## 2018-03-15 DIAGNOSIS — I872 Venous insufficiency (chronic) (peripheral): Secondary | ICD-10-CM | POA: Diagnosis present

## 2018-03-15 DIAGNOSIS — K219 Gastro-esophageal reflux disease without esophagitis: Secondary | ICD-10-CM | POA: Diagnosis present

## 2018-03-15 DIAGNOSIS — R52 Pain, unspecified: Secondary | ICD-10-CM | POA: Diagnosis not present

## 2018-03-15 DIAGNOSIS — R197 Diarrhea, unspecified: Secondary | ICD-10-CM | POA: Diagnosis not present

## 2018-03-15 DIAGNOSIS — G40909 Epilepsy, unspecified, not intractable, without status epilepticus: Secondary | ICD-10-CM | POA: Diagnosis present

## 2018-03-15 DIAGNOSIS — Z87898 Personal history of other specified conditions: Secondary | ICD-10-CM | POA: Diagnosis not present

## 2018-03-15 DIAGNOSIS — S32009A Unspecified fracture of unspecified lumbar vertebra, initial encounter for closed fracture: Secondary | ICD-10-CM | POA: Diagnosis present

## 2018-03-15 DIAGNOSIS — I110 Hypertensive heart disease with heart failure: Secondary | ICD-10-CM | POA: Diagnosis present

## 2018-03-15 DIAGNOSIS — E1140 Type 2 diabetes mellitus with diabetic neuropathy, unspecified: Secondary | ICD-10-CM | POA: Diagnosis not present

## 2018-03-15 DIAGNOSIS — E785 Hyperlipidemia, unspecified: Secondary | ICD-10-CM | POA: Diagnosis present

## 2018-03-15 DIAGNOSIS — F17210 Nicotine dependence, cigarettes, uncomplicated: Secondary | ICD-10-CM | POA: Diagnosis present

## 2018-03-15 DIAGNOSIS — E440 Moderate protein-calorie malnutrition: Secondary | ICD-10-CM | POA: Diagnosis present

## 2018-03-15 DIAGNOSIS — R29810 Facial weakness: Secondary | ICD-10-CM | POA: Diagnosis present

## 2018-03-15 DIAGNOSIS — R29701 NIHSS score 1: Secondary | ICD-10-CM | POA: Diagnosis present

## 2018-03-15 DIAGNOSIS — E876 Hypokalemia: Secondary | ICD-10-CM | POA: Diagnosis present

## 2018-03-15 DIAGNOSIS — S79911A Unspecified injury of right hip, initial encounter: Secondary | ICD-10-CM | POA: Diagnosis not present

## 2018-03-15 DIAGNOSIS — S32050A Wedge compression fracture of fifth lumbar vertebra, initial encounter for closed fracture: Secondary | ICD-10-CM | POA: Diagnosis present

## 2018-03-15 DIAGNOSIS — I739 Peripheral vascular disease, unspecified: Secondary | ICD-10-CM | POA: Diagnosis present

## 2018-03-15 DIAGNOSIS — M810 Age-related osteoporosis without current pathological fracture: Secondary | ICD-10-CM | POA: Diagnosis present

## 2018-03-15 DIAGNOSIS — I2510 Atherosclerotic heart disease of native coronary artery without angina pectoris: Secondary | ICD-10-CM | POA: Diagnosis present

## 2018-03-15 DIAGNOSIS — G459 Transient cerebral ischemic attack, unspecified: Secondary | ICD-10-CM | POA: Diagnosis present

## 2018-03-15 DIAGNOSIS — J449 Chronic obstructive pulmonary disease, unspecified: Secondary | ICD-10-CM | POA: Diagnosis present

## 2018-03-15 DIAGNOSIS — I252 Old myocardial infarction: Secondary | ICD-10-CM | POA: Diagnosis not present

## 2018-03-15 DIAGNOSIS — E119 Type 2 diabetes mellitus without complications: Secondary | ICD-10-CM | POA: Diagnosis present

## 2018-03-15 LAB — SURGICAL PCR SCREEN
MRSA, PCR: POSITIVE — AB
Staphylococcus aureus: POSITIVE — AB

## 2018-03-15 LAB — GLUCOSE, CAPILLARY
Glucose-Capillary: 117 mg/dL — ABNORMAL HIGH (ref 70–99)
Glucose-Capillary: 108 mg/dL — ABNORMAL HIGH (ref 70–99)
Glucose-Capillary: 114 mg/dL — ABNORMAL HIGH (ref 70–99)
Glucose-Capillary: 120 mg/dL — ABNORMAL HIGH (ref 70–99)
Glucose-Capillary: 118 mg/dL — ABNORMAL HIGH (ref 70–99)

## 2018-03-15 MED ORDER — CEFAZOLIN SODIUM-DEXTROSE 1-4 GM/50ML-% IV SOLN
1.0000 g | Freq: Once | INTRAVENOUS | Status: AC
  Administered 2018-03-16: 1 g via INTRAVENOUS

## 2018-03-15 MED ORDER — ADULT MULTIVITAMIN W/MINERALS CH
1.0000 | ORAL_TABLET | Freq: Every day | ORAL | Status: DC
  Administered 2018-03-15 – 2018-03-17 (×2): 1 via ORAL
  Filled 2018-03-15 (×2): qty 1

## 2018-03-15 MED ORDER — ENSURE ENLIVE PO LIQD
237.0000 mL | Freq: Two times a day (BID) | ORAL | Status: DC
  Administered 2018-03-15 – 2018-03-17 (×4): 237 mL via ORAL

## 2018-03-15 NOTE — Progress Notes (Signed)
OT Cancellation Note  Patient Details Name: Samantha Aguirre MRN: [id number] DOB: 1945-01-26   Cancelled Treatment:    Reason Eval/Treat Not Completed: Other (comment) Order received, chart reviewed. Pt noted to have 8/10 pain with ambulation during PT session, and is considering kyphoplasty. Will follow acutely and hold until further orthopedic POC is in place. If pt undergoes surgery w/ general anesthesia OT will require continue at transfer order for continuation of services.   Shara Blazing, M.S., OTR/L Ascom: [phone] 03/15/18, 12:31 PM

## 2018-03-15 NOTE — Evaluation (Signed)
Physical Therapy Evaluation Patient Details Name: Samantha Aguirre MRN: [id number] DOB: [birth year] Today's Date: 03/15/2018   History of Present Illness  From MD H&P:  Pt is a 73 y.o. female who woke up with left facial mouth drooping and her left eyelid could not decide whether he wanted to be closed or open.  She is also had a history of stroke in the past and she has difficulty getting her words out.  Pt also went to orthopedics and was diagnosed with a compression fracture of the lumbar spine and they ordered an MRI.  Assessment includes: MRI of the brain negative, slight progression of L1 compression fracture, acute on chronic L5 compression fracture with 40% height loss, hypokalemia, diarrhea, hypotension, and h/o epilepsy.       Clinical Impression  Pt presents with deficits in strength, transfers, mobility, gait, and activity tolerance.  Pt reported 5/10 LBP and 8/10 LBP with movement during the session.  Pt required min A with sidelying to/from sit during log roll training.  Pt was CGA with transfers with cues for sequencing and was able to amb a max of 100' before needing to return to supine secondary to back pain.  Upon returning to supine the pt reported her pain began to return back to resting baseline.  Pt reported has been limiting her ambulation/mobility at home secondary to back pain and now has a motorized chair that she uses "whenever I can" due to back pain.  Pt is at risk for further functional decline due to self-limiting mobility secondary to LBP.  Pt will benefit from HHPT services upon discharge to safely address above deficits for decreased caregiver assistance and eventual return to PLOF.      Follow Up Recommendations Home health PT    Equipment Recommendations  None recommended by PT    Recommendations for Other Services       Precautions / Restrictions Precautions Precautions: Back;Other (comment) Precaution Comments: Seizure precautions Required Braces or  Orthoses: Spinal Brace Spinal Brace: Lumbar corset;Other (comment) Spinal Brace Comments: Lumbosacral corset when out of bed Restrictions Weight Bearing Restrictions: No      Mobility  Bed Mobility Overal bed mobility: Needs Assistance Bed Mobility: Sit to Sidelying;Sidelying to Sit   Sidelying to sit: Min assist     Sit to sidelying: Min assist General bed mobility comments: Log roll technique training with mod verbal cues for sequencing  Transfers Overall transfer level: Needs assistance Equipment used: Rolling walker (2 wheeled) Transfers: Sit to/from Stand Sit to Stand: Min guard         General transfer comment: Sit to/from stand transfers from various height surfaces with mod verbal cues for sequencing.  Ambulation/Gait Ambulation/Gait assistance: Min guard Gait Distance (Feet): 100 Feet Assistive device: Rolling walker (2 wheeled) Gait Pattern/deviations: Step-through pattern;Decreased step length - right;Decreased step length - left Gait velocity: decreased   General Gait Details: Mod verbal cues for upright posture with amb closer to the RW; slow, cautious cadence and short B step length but steady without LOB  Stairs            Wheelchair Mobility    Modified Rankin (Stroke Patients Only)       Balance Overall balance assessment: No apparent balance deficits (not formally assessed)  Pertinent Vitals/Pain Pain Assessment: 0-10 Pain Score: 5  Pain Location: Low back at rest, 8/10 with ambulation Pain Descriptors / Indicators: Sore;Shooting;Aching Pain Intervention(s): Monitored during session;Premedicated before session    [place] expects to be discharged to:: Private residence Living Arrangements: Alone Available Help at Discharge: Family;Friend(s);Available PRN/intermittently Type of Home: Apartment Home Access: Level entry     Home Layout: One level Home  Equipment: Walker - 2 wheels;Shower seat;Adaptive equipment;Cane - single point;Grab bars - tub/shower;Wheelchair - power      Prior Function Level of Independence: Independent with assistive device(s)         Comments: Pt Mod Ind with amb HH distances with a RW, 2 falls in the last 6 months, uses a power chair "whenever possible", has someone in the apt when she showers for safety but otherwise Ind with ADLs     Hand Dominance   Dominant Hand: Right    Extremity/Trunk Assessment   Upper Extremity Assessment Upper Extremity Assessment: Defer to OT evaluation;Generalized weakness    Lower Extremity Assessment Lower Extremity Assessment: Generalized weakness       Communication   Communication: No difficulties  Cognition Arousal/Alertness: Awake/alert Behavior During Therapy: WFL for tasks assessed/performed Overall Cognitive Status: Within Functional Limits for tasks assessed                                        General Comments      Exercises Total Joint Exercises Ankle Circles/Pumps: AROM;Both;10 reps Quad Sets: Strengthening;Both;10 reps Gluteal Sets: Strengthening;Both;10 reps Short Arc Quad: Strengthening;Both;10 reps Heel Slides: AROM;Both;5 reps Long Arc Quad: Strengthening;Both;10 reps Knee Flexion: Strengthening;Both;10 reps Other Exercises Other Exercises: Bed mobility training using log roll technique Other Exercises: Transfer training from multiple surfaces Other Exercises: HEP education for BLE APs, QS, GS, and LAQs x 10 each every 1-2 hours daily   Assessment/Plan    PT Assessment Patient needs continued PT services  PT Problem List Decreased strength;Decreased activity tolerance;Decreased mobility;Decreased knowledge of use of DME;Decreased knowledge of precautions       PT Treatment Interventions DME instruction;Gait training;Functional mobility training;Therapeutic activities;Therapeutic exercise;Balance  training;Patient/family education    PT Goals (Current goals can be found in the Care Plan section)  Acute Rehab PT Goals Patient Stated Goal: To move with less pain PT Goal Formulation: With patient Time For Goal Achievement: 03/28/18 Potential to Achieve Goals: Fair    Frequency Min 2X/week   Barriers to discharge        Co-evaluation               AM-PAC PT "6 Clicks" Mobility  Outcome Measure Help needed turning from your back to your side while in a flat bed without using bedrails?: A Little Help needed moving from lying on your back to sitting on the side of a flat bed without using bedrails?: A Little Help needed moving to and from a bed to a chair (including a wheelchair)?: A Little Help needed standing up from a chair using your arms (e.g., wheelchair or bedside chair)?: A Little Help needed to walk in hospital room?: A Little Help needed climbing 3-5 steps with a railing? : A Little 6 Click Score: 18    End of Session Equipment Utilized During Treatment: Gait belt Activity Tolerance: Patient limited by pain Patient left: in bed;with call bell/phone within reach;with bed alarm set Nurse Communication: Mobility status;Other (comment)(Lumbosacral corset  needed) PT Visit Diagnosis: Muscle weakness (generalized) (M62.81);History of falling (Z91.81);Difficulty in walking, not elsewhere classified (R26.2);Pain Pain - part of body: (LBP)    Time: 5259-1028 PT Time Calculation (min) (ACUTE ONLY): 32 min   Charges:   PT Evaluation $PT Eval Low Complexity: 1 Low PT Treatments $Therapeutic Exercise: 8-22 mins         D. Scott Kayla Deshaies PT, DPT 03/15/18, 10:45 AM

## 2018-03-15 NOTE — Progress Notes (Signed)
Patient was seen with request of Dr. Mack Guise after he evaluated her yesterday.  Her MRI was reviewed and her pain is the same level at L5 as her MRI finding of new fracture.  Her L1 fracture appears chronic.  I reviewed the kyphoplasty procedure with the patient with brochure given and bone model.  I will check on her later today and see if she tolerates therapy if she does not I would recommend kyphoplasty to get her mobilized and could do that tomorrow.

## 2018-03-15 NOTE — Care Management Note (Signed)
Case Management Note  Patient Details  Name: Samantha Aguirre MRN: [id number] Date of Birth: 1945-05-20  Subjective/Objective:                  Met with the patient to discuss DC plan and needs Patient lives alone Has DME at home including [facility] and [facility] Has been provided the [facility] list per CMS.gov for choice Patient has used [facility] before and would like to use them again, Notified Joelene Millin via phone call with [facility] left a secure VM requesting a call back to confirm they are going to accept the patient Patient has transportation with [place] PCP is [facility]  Action/Plan:   Has DME at home including [facility] and [facility] Has been provided the [facility] list per CMS.gov for choice Patient has used [facility] before and would like to use them again, Notified Joelene Millin via phone call with [facility] left a secure VM requesting a call back to confirm they are going to accept the patient     Expected Discharge Date:                  Expected Discharge Plan:     In-House Referral:     Discharge planning Services  CM Consult  Post Acute Care Choice:    Choice offered to:     DME Arranged:    DME Agency:     HH Arranged:  PT HH Agency:  [facility]  Status of Service:  In process, will continue to follow  If discussed at Long Length of Stay Meetings, dates discussed:    Additional Comments:  Su Hilt, RN 03/15/2018, 2:40 PM

## 2018-03-15 NOTE — Progress Notes (Signed)
Patient did not tolerate PT well at all, severe LBP. Plan kyphoplasty at L% tomorrow, left shoulder marked.

## 2018-03-15 NOTE — Care Management (Signed)
Cassie with [facility] called back and accepted the patient for [facility] services

## 2018-03-15 NOTE — Progress Notes (Signed)
SLP Cancellation Note  Patient Details Name: Samantha Aguirre MRN: [id number] DOB: 05/13/1945   Cancelled treatment:       Reason Eval/Treat Not Completed: SLP screened, no needs identified, will sign off(chart reviewed; consulted NSG then met w/ pt in room). Pt denied any difficulty swallowing and is currently on a regular diet; tolerates swallowing pills w/ water per NSG. Pt conversed at conversational level w/ SLP w/out deficits noted; pt denied any speech-language deficits now stating "I'm fine now; it's just when I came in that I had a problem".   No further skilled ST services indicated as pt appears at her baseline. Pt agreed. NSG to reconsult if any change in status.     Katherine Watson, MS, CCC-SLP Watson,Katherine 03/15/2018, 2:23 PM   

## 2018-03-15 NOTE — Progress Notes (Addendum)
Initial Nutrition Assessment  DOCUMENTATION CODES:   Non-severe (moderate) malnutrition in context of chronic illness  INTERVENTION:   - Ensure Enlive po BID, each supplement provides 350 kcal and 20 grams of protein  - MVI with minerals daily  - Recommend liberalizing diet to 2 gram sodium  NUTRITION DIAGNOSIS:   Moderate Malnutrition related to chronic illness (CHF, COPD) as evidenced by mild fat depletion, mild muscle depletion, moderate muscle depletion.  GOAL:   Patient will meet greater than or equal to 90% of their needs  MONITOR:   Supplement acceptance, PO intake, Labs, Weight trends  REASON FOR ASSESSMENT:   Malnutrition Screening Tool    ASSESSMENT:   73 year old female who presented to the ED on 2/29 with frontal headache and lower back pain. PMH significant for CHF, COPD, CAD, T2DM, HLD, HTN, seizures. Pt also with acute worsening L1 compression fracture and acute on chronic L5 compression fracture.  Per orthopedics note, if pt does not tolerate therapies today, recommend kyphoplasty tomorrow.  Spoke with pt at bedside who reports not wanting to eat for just under 1 year. Pt reports that today for breakfast, she had some oatmeal, a piece of toast, and coffee.  Pt reports that some days, all she eats is a piece of toast. On other days, pt may eat 3 small meals that could include a bowl of noodles or macaroni salad.  Pt reports a 30 lb weight loss since April or June 2019. Pt reports her UBW as 191 lbs but that now she weighs 126 lbs. Weight on 03/13/18 was 156 lbs but appears stated rather than measured.  Pt reports that she does not usually drink oral nutrition supplements because she can't afford them. Pt willing to drink supplements during admission. RD to order Ensure Enlive.  Per weight history in chart, pt with 16.3 kg (35.9 lb) weight loss over the last 1 year. This is an 18.7% weight loss which is not quite significant for timeframe.  Meal Completion:  100% x 4 meals  Medications reviewed and include: SSI, Protonix, vitamin B-12 500 mcg daily  Labs reviewed: HDL 32 (L), triglycerides 192 (H) CBG's: 108, 117, 134, 119 x 24 hours  NUTRITION - FOCUSED PHYSICAL EXAM:    Most Recent Value  Orbital Region  Mild depletion  Upper Arm Region  Mild depletion  Thoracic and Lumbar Region  No depletion  Buccal Region  Mild depletion  Temple Region  Mild depletion  Clavicle Bone Region  Mild depletion  Clavicle and Acromion Bone Region  Moderate depletion  Scapular Bone Region  Mild depletion  Dorsal Hand  Mild depletion  Patellar Region  Mild depletion  Anterior Thigh Region  Mild depletion  Posterior Calf Region  Mild depletion  Edema (RD Assessment)  None  Hair  Reviewed  Eyes  Reviewed  Mouth  Reviewed  Skin  Reviewed  Nails  Reviewed       Diet Order:   Diet Order            Diet NPO time specified  Diet effective midnight        Diet heart healthy/carb modified Room service appropriate? Yes; Fluid consistency: Thin  Diet effective now              EDUCATION NEEDS:   Education needs have been addressed  Skin:  Skin Assessment: Reviewed RN Assessment  Last BM:  03/12/18 per pt report  Height:   Ht Readings from Last 1 Encounters:  03/13/18 5\' 1"  (  1.549 m)    Weight:   Wt Readings from Last 1 Encounters:  03/13/18 70.8 kg    Ideal Body Weight:  47.7 kg  BMI:  Body mass index is 29.48 kg/m.  Estimated Nutritional Needs:   Kcal:  1500-1700 (MSJ x 1.3-1.5)  Protein:  80-95 grams (1.1-1.3 grams/kg)  Fluid:  1.5-1.7 L (1 ml/kcal)    Gaynell Face, MS, RD, LDN Inpatient Clinical Dietitian Pager: [phone] Weekend/After Hours: [phone]

## 2018-03-15 NOTE — Progress Notes (Addendum)
[place] [facility] [place] NAME: Samantha Aguirre    MR#:  [id number]  DATE OF BIRTH:  December 21, 1945  SUBJECTIVE:  CHIEF COMPLAINT:   Orthopedic surgery and physical therapy input appreciated, patient continues to complain of exquisite low back pain when ambulating, possible need for vertebroplasty on tomorrow, change to admission status given continued pain   REVIEW OF SYSTEMS:  CONSTITUTIONAL: No fever, fatigue or weakness.  EYES: No blurred or double vision.  EARS, NOSE, AND THROAT: No tinnitus or ear pain.  RESPIRATORY: No cough, shortness of breath, wheezing or hemoptysis.  CARDIOVASCULAR: No chest pain, orthopnea, edema.  GASTROINTESTINAL: No nausea, vomiting, diarrhea or abdominal pain.  GENITOURINARY: No dysuria, hematuria.  ENDOCRINE: No polyuria, nocturia,  HEMATOLOGY: No anemia, easy bruising or bleeding SKIN: No rash or lesion. MUSCULOSKELETAL: No joint pain or arthritis.   NEUROLOGIC: No tingling, numbness, weakness.  PSYCHIATRY: No anxiety or depression.   ROS  DRUG ALLERGIES:   Allergies  Allergen Reactions  . Ivp Dye [Iodinated Diagnostic Agents] Itching    Pt injected with IV contrast.  5 min after injection pt complained of itching behind ear and on her abd.  . Methotrexate Rash  . Morphine And Related Other (See Comments)    "stops my breathing" NEAR-RESPIRATORY ARREST  . Mushroom Extract Complex Anaphylaxis    Made patient "deathly sick"  . Atenolol Other (See Comments)    "MD took me off of it bc it was doing something wrong" Made patient HYPOTENSIVE  . Percocet [Oxycodone-Acetaminophen] Nausea And Vomiting and Other (See Comments)    Stomach pains  . Percodan [Oxycodone-Aspirin] Nausea And Vomiting and Other (See Comments)    Stomach pains  . Tramadol Nausea Only  . Verapamil Other (See Comments)    " MD told me this was screwing me up" MAKES PATIENT HYPOTENSIVE  . Oxycodone Hcl     VITALS:  Blood pressure  134/61, pulse (!) 52, temperature 97.7 F (36.5 C), temperature source Oral, resp. rate 18, height 5\' 1"  (1.549 m), weight 70.8 kg, SpO2 97 %.  PHYSICAL EXAMINATION:  GENERAL:  73 y.o.-year-old patient lying in the bed with no acute distress.  EYES: Pupils equal, round, reactive to light and accommodation. No scleral icterus. Extraocular muscles intact.  HEENT: Head atraumatic, normocephalic. Oropharynx and nasopharynx clear.  NECK:  Supple, no jugular venous distention. No thyroid enlargement, no tenderness.  LUNGS: Normal breath sounds bilaterally, no wheezing, rales,rhonchi or crepitation. No use of accessory muscles of respiration.  CARDIOVASCULAR: S1, S2 normal. No murmurs, rubs, or gallops.  ABDOMEN: Soft, nontender, nondistended. Bowel sounds present. No organomegaly or mass.  EXTREMITIES: No pedal edema, cyanosis, or clubbing.  NEUROLOGIC: Cranial nerves II through XII are intact. Muscle strength 5/5 in all extremities. Sensation intact. Gait not checked.  PSYCHIATRIC: The patient is alert and oriented x 3.  SKIN: No obvious rash, lesion, or ulcer.   Physical Exam LABORATORY PANEL:   CBC Recent Labs  Lab 03/14/18 0522  WBC 6.6  HGB 12.1  HCT 37.4  PLT 244   ------------------------------------------------------------------------------------------------------------------  Chemistries  Recent Labs  Lab 03/13/18 0826 03/14/18 0522  NA 136 136  K 2.7* 4.4  CL 99 103  CO2 25 25  GLUCOSE 132* 117*  BUN 9 11  CREATININE 0.69 0.60  CALCIUM 9.0 8.4*  MG 1.8  --   AST 22  --   ALT 16  --   ALKPHOS 123  --   BILITOT 0.6  --    ------------------------------------------------------------------------------------------------------------------  Cardiac Enzymes No results for input(s): TROPONINI in the last 168 hours. ------------------------------------------------------------------------------------------------------------------  RADIOLOGY:  Mr Brain Wo  Contrast  Result Date: 03/13/2018 CLINICAL DATA:  Headache and left facial droop. EXAM: MRI HEAD WITHOUT CONTRAST TECHNIQUE: Multiplanar, multiecho pulse sequences of the brain and surrounding structures were obtained without intravenous contrast. COMPARISON:  Head CT 03/13/2018 and MRI 10/20/2017 FINDINGS: Multiple sequences are mildly motion degraded. Brain: There is no evidence of acute infarct, midline shift, or extra-axial fluid collection. A chronic moderate-sized posterior left MCA territory infarct and multiple small chronic bilateral cerebellar infarcts are again noted. A chronic infarct involving the genu of the corpus callosum on the right is new from the prior MRI. Patchy to confluent T2 hyperintensities throughout the cerebral white matter bilaterally elsewhere are similar to the prior MRI and compatible with extensive chronic small vessel ischemic disease. Cerebral atrophy is unchanged from the prior MRI. A 1 cm calcified extra-axial mass in the right parasagittal frontoparietal vertex region is unchanged and compatible with a meningioma without associated edema. Vascular: Major intracranial vascular flow voids are preserved. Skull and upper cervical spine: Unremarkable bone marrow signal. Sinuses/Orbits: Unremarkable orbits. Paranasal sinuses and mastoid air cells are clear. Other: None. IMPRESSION: 1. No acute intracranial abnormality. 2. Extensive chronic ischemia as above. Interval chronic infarct in the corpus callosum since 10/2017. Electronically Signed   By: Logan Bores M.D.   On: 03/13/2018 12:45   Mr Lumbar Spine Wo Contrast  Result Date: 03/13/2018 CLINICAL DATA:  Low back pain. New L5 compression fracture on recent radiographs. EXAM: MRI LUMBAR SPINE WITHOUT CONTRAST TECHNIQUE: Multiplanar, multisequence MR imaging of the lumbar spine was performed. No intravenous contrast was administered. COMPARISON:  Report from lumbar spine radiographs 03/11/2018. Lumbar spine MRI 06/19/2014.  FINDINGS: Segmentation:  Standard. Alignment:  Minimal chronic anterolisthesis of L4 on L5. Vertebrae: Chronic L1 superior endplate compression fracture with unchanged mild vertebral body height loss compared to the prior MRI and no marrow edema. Chronic L5 inferior endplate compression deformity with new L5 superior endplate fracture. 40% L5 vertebral body height loss and moderate marrow edema. New marrow edema about the right greater than left L4-5 facet joints favored to be degenerative. No suspicious osseous lesion. Conus medullaris and cauda equina: Conus extends to the upper L2 level. Conus and cauda equina appear normal. Paraspinal and other soft tissues: 3 mm T2 hyperintense lesion in the right kidney, likely a cyst. Disc levels: Disc desiccation throughout the lumbar and lower thoracic spine. T11-12: Only imaged sagittally. Disc bulging and facet hypertrophy result in mild spinal stenosis without significant neural foraminal stenosis, unchanged. T12-L1: Disc bulging, minimal L1 superior endplate retropulsion, and mild facet hypertrophy result in mild spinal stenosis and minimal bilateral neural foraminal narrowing, unchanged. L1-2: Mild facet hypertrophy without disc herniation or stenosis, unchanged. L2-3: Minimal disc bulging, mild facet and ligamentum flavum hypertrophy, and congenitally short pedicles result in borderline to mild spinal stenosis without neural foraminal stenosis, unchanged. L3-4: Disc bulging greater to the left, moderate facet and ligamentum flavum hypertrophy, and congenitally short pedicles result in mild spinal stenosis, mild bilateral lateral recess stenosis, and borderline right and mild-to-moderate left neural foraminal stenosis, slightly progressed. L4-5: Anterolisthesis with bulging uncovered disc, congenitally short pedicles, and severe facet hypertrophy result in mild spinal stenosis, mild bilateral lateral recess stenosis, and moderate right and mild left neural foraminal  stenosis, unchanged. L5-S1: A small central disc protrusion is new but does not result in spinal or lateral recess stenosis. Disc bulging and moderate  to severe facet hypertrophy result in mild bilateral neural foraminal stenosis, unchanged. IMPRESSION: 1. Acute on chronic L5 compression fracture with 40% height loss. 2. Slightly progressive degenerative changes at L3-4 with mild spinal and mild-to-moderate left neural foraminal stenosis. 3. New small central disc protrusion at L5-S1 without associated stenosis. 4. New bilateral facet edema at L4-5. Electronically Signed   By: Logan Bores M.D.   On: 03/13/2018 12:59   US Carotid Bilateral (at [facility] Only)  Result Date: 03/13/2018 CLINICAL DATA:  Facial droop EXAM: BILATERAL CAROTID DUPLEX ULTRASOUND TECHNIQUE: Pearline Cables scale imaging, color Doppler and duplex ultrasound were performed of bilateral carotid and vertebral arteries in the neck. COMPARISON:  None. FINDINGS: Criteria: Quantification of carotid stenosis is based on velocity parameters that correlate the residual internal carotid diameter with NASCET-based stenosis levels, using the diameter of the distal internal carotid lumen as the denominator for stenosis measurement. The following velocity measurements were obtained: RIGHT ICA: 78 cm/sec CCA: 56 cm/sec SYSTOLIC ICA/CCA RATIO:  1.4 ECA: 78 cm/sec LEFT ICA: 70 cm/sec CCA: 68 cm/sec SYSTOLIC ICA/CCA RATIO:  1.0 ECA: 66 cm/sec RIGHT CAROTID ARTERY: Minimal soft smooth plaque in the bulb. Low resistance internal carotid Doppler pattern. RIGHT VERTEBRAL ARTERY:  Antegrade. LEFT CAROTID ARTERY: Mild irregular calcified plaque in the mid and upper common carotid artery. Mild irregular calcified plaque in the bulb. Low resistance internal carotid Doppler pattern is preserved. LEFT VERTEBRAL ARTERY:  Antegrade. IMPRESSION: Less than 50% stenosis in the right and left internal carotid arteries. Electronically Signed   By: Marybelle Killings M.D.   On: 03/13/2018  13:24    ASSESSMENT AND PLAN:  *Acute Left facial droop, eyelid droop, problems with speech Appears resolved Continue CVA/TIA protocol, await further neurology recommendations, MRI of the brain/carotid Dopplers negative, echocardiogram was a normal study, continue aspirin, statin therapy Patient is hesitant on increasing anticoagulation with GI bleed last year with AVMs.  PT/OT/speech therapy to evaluate/treat  *Acute worsening L1 compression fracture Orthopedic surgery input appreciated-possible vertebroplasty on tomorrow  Physical therapy did recommend home health PT status post discharge but patient does continue complain of exquisite plane low back with ambulation/standing   *Hypokalemia Repleted  *Diarrhea  Resolved  GI panel negative  *Relative hypotension Resolved Normal blood pressures noted Continue to hold antihypertensive agents  *Chronic diabetes mellitus type 2 Controlled Continue heart healthy/carbohydrate consistent diet, sliding scale insulin with Accu-Cheks per routine  *Epilepsy Stable Continue seizure precautions Keppra   *Chronic hypothyroidism, unspecified  Stable  Continue levothyroxine  *Chronic hyperlipidemia, unspecified Stable Continue statin therapy  *Chronic tobacco smoker abuse/dependency Nicotine patch and cessation counseling ordered  Disposition Home in 1 to 2 days with neurology and orthopedic surgery clearance  All the records are reviewed and case discussed with Care Management/Social Workerr. Management plans discussed with the patient, family and they are in agreement.  CODE STATUS: dnr  TOTAL TIME TAKING CARE OF THIS PATIENT: 40 minutes.   POSSIBLE D/C IN 1-3 DAYS, DEPENDING ON CLINICAL CONDITION.   Avel Peace  M.D on 03/15/2018   Between 7am to 6pm - Pager - [phone]  After 6pm go to [email or web address] - password [email or web address] [facility]  Office  [phone]  CC: Primary care physician; Tracie Harrier, MD  Note: This dictation was prepared with Dragon dictation along with smaller phrase technology. Any transcriptional errors that result from this process are unintentional.

## 2018-03-16 ENCOUNTER — Ambulatory Visit: Admission: RE | Admit: 2018-03-16 | Payer: Medicare Other | Source: Ambulatory Visit

## 2018-03-16 ENCOUNTER — Encounter
Admission: EM | Disposition: A | Discharge status: Home Health | Payer: Self-pay | Source: Home / Self Care | Attending: Family Medicine

## 2018-03-16 ENCOUNTER — Inpatient Hospital Stay: Payer: Medicare Other | Admitting: Anesthesiology

## 2018-03-16 ENCOUNTER — Ambulatory Visit: Payer: Medicare Other

## 2018-03-16 ENCOUNTER — Inpatient Hospital Stay: Payer: Medicare Other

## 2018-03-16 ENCOUNTER — Encounter: Payer: Self-pay | Admitting: Anesthesiology

## 2018-03-16 ENCOUNTER — Inpatient Hospital Stay: Admit: 2018-03-16 | Payer: Medicare Other | Admitting: Orthopedic Surgery

## 2018-03-16 DIAGNOSIS — E440 Moderate protein-calorie malnutrition: Secondary | ICD-10-CM

## 2018-03-16 HISTORY — PX: KYPHOPLASTY: SHX5884

## 2018-03-16 LAB — GLUCOSE, CAPILLARY
Glucose-Capillary: 124 mg/dL — ABNORMAL HIGH (ref 70–99)
Glucose-Capillary: 108 mg/dL — ABNORMAL HIGH (ref 70–99)
Glucose-Capillary: 90 mg/dL (ref 70–99)
Glucose-Capillary: 104 mg/dL — ABNORMAL HIGH (ref 70–99)
Glucose-Capillary: 95 mg/dL (ref 70–99)

## 2018-03-16 LAB — TSH: TSH: 2.703 u[IU]/mL (ref 0.350–4.500)

## 2018-03-16 SURGERY — KYPHOPLASTY
Anesthesia: General

## 2018-03-16 MED ORDER — METHOCARBAMOL 1000 MG/10ML IJ SOLN
500.0000 mg | Freq: Four times a day (QID) | INTRAVENOUS | Status: DC | PRN
  Filled 2018-03-16: qty 5

## 2018-03-16 MED ORDER — BISACODYL 5 MG PO TBEC: 5.0000 mg | DELAYED_RELEASE_TABLET | Freq: Every day | ORAL | Status: DC | PRN

## 2018-03-16 MED ORDER — SODIUM CHLORIDE 0.9 % IV SOLN
INTRAVENOUS | Status: DC
  Administered 2018-03-16: 23:00:00 via INTRAVENOUS

## 2018-03-16 MED ORDER — CHLORHEXIDINE GLUCONATE CLOTH 2 % EX PADS
6.0000 | MEDICATED_PAD | Freq: Every day | CUTANEOUS | Status: DC
  Administered 2018-03-16: 05:00:00 6 via TOPICAL

## 2018-03-16 MED ORDER — METOCLOPRAMIDE HCL 5 MG/ML IJ SOLN: 5.0000 mg | Freq: Three times a day (TID) | INTRAMUSCULAR | Status: DC | PRN

## 2018-03-16 MED ORDER — SODIUM CHLORIDE 0.9 % IV SOLN
INTRAVENOUS | Status: DC | PRN
  Administered 2018-03-16: 18:00:00 via INTRAVENOUS

## 2018-03-16 MED ORDER — KETOROLAC TROMETHAMINE 30 MG/ML IJ SOLN
7.5000 mg | Freq: Three times a day (TID) | INTRAMUSCULAR | Status: DC | PRN
  Administered 2018-03-16: 23:00:00 7.5 mg via INTRAVENOUS
  Filled 2018-03-16: qty 1

## 2018-03-16 MED ORDER — ORAL CARE MOUTH RINSE
15.0000 mL | Freq: Two times a day (BID) | OROMUCOSAL | Status: DC
  Administered 2018-03-17: 15 mL via OROMUCOSAL

## 2018-03-16 MED ORDER — LIDOCAINE HCL 1 % IJ SOLN
INTRAMUSCULAR | Status: DC | PRN
  Administered 2018-03-16: 10 mL
  Administered 2018-03-16: 20 mL

## 2018-03-16 MED ORDER — BUPIVACAINE-EPINEPHRINE (PF) 0.5% -1:200000 IJ SOLN
INTRAMUSCULAR | Status: DC | PRN
  Administered 2018-03-16: 20 mL via PERINEURAL

## 2018-03-16 MED ORDER — FENTANYL CITRATE (PF) 100 MCG/2ML IJ SOLN
25.0000 ug | INTRAMUSCULAR | Status: DC | PRN
  Administered 2018-03-16 (×4): 25 ug via INTRAVENOUS

## 2018-03-16 MED ORDER — METHOCARBAMOL 500 MG PO TABS
500.0000 mg | ORAL_TABLET | Freq: Four times a day (QID) | ORAL | Status: DC | PRN
  Filled 2018-03-16: qty 1

## 2018-03-16 MED ORDER — PROPOFOL 500 MG/50ML IV EMUL
INTRAVENOUS | Status: DC | PRN
  Administered 2018-03-16: 75 ug/kg/min via INTRAVENOUS

## 2018-03-16 MED ORDER — FENTANYL CITRATE (PF) 100 MCG/2ML IJ SOLN
INTRAMUSCULAR | Status: AC
  Administered 2018-03-16: 25 ug via INTRAVENOUS
  Filled 2018-03-16: qty 2

## 2018-03-16 MED ORDER — PROPOFOL 500 MG/50ML IV EMUL
INTRAVENOUS | Status: AC
  Filled 2018-03-16: qty 50

## 2018-03-16 MED ORDER — KETAMINE HCL 10 MG/ML IJ SOLN
INTRAMUSCULAR | Status: DC | PRN
  Administered 2018-03-16: 10 mg via INTRAVENOUS
  Administered 2018-03-16: 20 mg via INTRAVENOUS

## 2018-03-16 MED ORDER — METOCLOPRAMIDE HCL 5 MG PO TABS: 5.0000 mg | ORAL_TABLET | Freq: Three times a day (TID) | ORAL | Status: DC | PRN

## 2018-03-16 MED ORDER — MIDAZOLAM HCL 2 MG/2ML IJ SOLN
INTRAMUSCULAR | Status: AC
  Filled 2018-03-16: qty 2

## 2018-03-16 MED ORDER — MAGNESIUM HYDROXIDE 400 MG/5ML PO SUSP
30.0000 mL | Freq: Every day | ORAL | Status: DC | PRN
  Filled 2018-03-16: qty 30

## 2018-03-16 MED ORDER — MIDAZOLAM HCL 2 MG/2ML IJ SOLN
INTRAMUSCULAR | Status: DC | PRN
  Administered 2018-03-16: .5 mg via INTRAVENOUS

## 2018-03-16 MED ORDER — MUPIROCIN 2 % EX OINT
1.0000 "application " | TOPICAL_OINTMENT | Freq: Two times a day (BID) | CUTANEOUS | Status: DC
  Administered 2018-03-16 – 2018-03-17 (×4): 1 via NASAL
  Filled 2018-03-16: qty 22

## 2018-03-16 MED ORDER — DOCUSATE SODIUM 100 MG PO CAPS
100.0000 mg | ORAL_CAPSULE | Freq: Two times a day (BID) | ORAL | Status: DC
  Administered 2018-03-16 – 2018-03-17 (×2): 100 mg via ORAL
  Filled 2018-03-16 (×2): qty 1

## 2018-03-16 MED ORDER — MAGNESIUM CITRATE PO SOLN
1.0000 | Freq: Once | ORAL | Status: DC | PRN
  Filled 2018-03-16: qty 296

## 2018-03-16 MED ORDER — CHLORHEXIDINE GLUCONATE 0.12 % MT SOLN
15.0000 mL | Freq: Two times a day (BID) | OROMUCOSAL | Status: DC
  Filled 2018-03-16 (×2): qty 15

## 2018-03-16 SURGICAL SUPPLY — 20 items
CEMENT KYPHON CX01A KIT/MIXER (Cement) ×3 IMPLANT
COVER WAND RF STERILE (DRAPES) ×1 IMPLANT
DERMABOND ADVANCED (GAUZE/BANDAGES/DRESSINGS) ×2
DERMABOND ADVANCED .7 DNX12 (GAUZE/BANDAGES/DRESSINGS) ×1 IMPLANT
DEVICE BIOPSY BONE KYPHX (INSTRUMENTS) ×3 IMPLANT
DRAPE C-ARM XRAY 36X54 (DRAPES) ×3 IMPLANT
DURAPREP 26ML APPLICATOR (WOUND CARE) ×3 IMPLANT
FEE RENTAL RFA GENERATOR (MISCELLANEOUS) IMPLANT
GLOVE SURG SYN 9.0  PF PI (GLOVE) ×2
GLOVE SURG SYN 9.0 PF PI (GLOVE) ×1 IMPLANT
GOWN SRG 2XL LVL 4 RGLN SLV (GOWNS) ×1 IMPLANT
GOWN STRL NON-REIN 2XL LVL4 (GOWNS) ×2
GOWN STRL REUS W/ TWL LRG LVL3 (GOWN DISPOSABLE) ×1 IMPLANT
GOWN STRL REUS W/TWL LRG LVL3 (GOWN DISPOSABLE) ×2
PACK KYPHOPLASTY (MISCELLANEOUS) ×3 IMPLANT
RENTAL RFA  GENERATOR (MISCELLANEOUS)
RENTAL RFA GENERATOR (MISCELLANEOUS) IMPLANT
STRAP SAFETY 5IN WIDE (MISCELLANEOUS) ×3 IMPLANT
TRAY KYPHOPAK 15/3 EXPRESS 1ST (MISCELLANEOUS) ×1 IMPLANT
TRAY KYPHOPAK 20/3 EXPRESS 1ST (MISCELLANEOUS) ×3 IMPLANT

## 2018-03-16 NOTE — Op Note (Signed)
Date March 16, 2018  time 7:01 PM   PATIENT:  Samantha Aguirre   PRE-OPERATIVE DIAGNOSIS:  closed wedge compression fracture of L5   POST-OPERATIVE DIAGNOSIS:  closed wedge compression fracture of L5   PROCEDURE:  Procedure(s): KYPHOPLASTY L5  SURGEON: Laurene Footman, MD   ASSISTANTS: None   ANESTHESIA:   local and MAC   EBL:  No intake/output data recorded.   BLOOD ADMINISTERED:none   DRAINS: none    LOCAL MEDICATIONS USED:  MARCAINE    and XYLOCAINE    SPECIMEN:   L5 vertebral body biopsy   DISPOSITION OF SPECIMEN:  Pathology   COUNTS:  YES   TOURNIQUET:  * No tourniquets in log *   IMPLANTS: Bone cement   DICTATION: .Dragon Dictation  patient was brought to the operating room and after adequate anesthesia was obtained the patient was placed prone.  C arm was brought in in good visualization of the affected level obtained on both AP and lateral projections.  After patient identification and timeout procedures were completed, local anesthetic was infiltrated with 10 cc 1% Xylocaine infiltrated subcutaneously.  This is done the area on the right side of the planned approach.  The back was then prepped and draped in the usual sterile manner and repeat timeout procedure carried out.  A spinal needle was brought down to the pedicle on the right side of  L5 and a 50-50 mix of 1% Xylocaine half percent Sensorcaine with epinephrine total of 20 cc injected.  After allowing this to set a small incision was made and the trocar was advanced into the vertebral body in an extrapedicular fashion.  Biopsy was obtained Drilling was carried out balloon inserted with inflation to  for cc.  When the cement was appropriate consistency 6 cc were injected into the vertebral body without extravasation, good fill superior to inferior endplates and from right to left sides along the inferior endplate.  After the cement had set the trochar was removed and permanent C-arm views obtained.  The wound was  closed with Dermabond followed by [place] continue as inpatient   PATIENT DISPOSITION:  PACU - hemodynamically stable.

## 2018-03-16 NOTE — Anesthesia Post-op Follow-up Note (Signed)
Anesthesia QCDR form completed.        

## 2018-03-16 NOTE — Discharge Instructions (Signed)
Core Strength Exercises  Core exercises help to build strength in the muscles between your ribs and your hips (abdominal muscles). These muscles help to support your body and keep your spine stable. It is important to maintain strength in your core to prevent injury and pain. Some activities, such as yoga and Pilates, can help to strengthen core muscles. You can also strengthen core muscles with exercises at home. It is important to talk to your health care provider before you start a new exercise routine. What are the benefits of core strength exercises? Core strength exercises can:  Reduce back pain.  Help to rebuild strength after a back or spine injury.  Help to prevent injury during physical activity, especially injuries to the back and knees. How to do core strength exercises Repeat these exercises 10-15 times, or until you are tired. Do exercises exactly as told by your health care provider and adjust them as directed. It is normal to feel mild stretching, pulling, tightness, or discomfort as you do these exercises. If you feel any pain while doing these exercises, stop. If your pain continues or gets worse when doing core exercises, contact your health care provider. You may want to use a padded yoga or exercise mat for strength exercises that are done on the floor. Bridging  1. Lie on your back on a firm surface with your knees bent and your feet flat on the floor. 2. Raise your hips so that your knees, hips, and shoulders form a straight line together. Keep your abdominal muscles tight. 3. Hold this position for 3-5 seconds. 4. Slowly lower your hips to the starting position. 5. Let your muscles relax completely between repetitions. Single-leg bridge 1. Lie on your back on a firm surface with your knees bent and your feet flat on the floor. 2. Raise your hips so that your knees, hips, and shoulders form a straight line together. Keep your abdominal muscles tight. 3. Lift one  foot off the floor, then completely straighten that leg. 4. Hold this position for 3-5 seconds. 5. Put the straight leg back down in the bent position. 6. Slowly lower your hips to the starting position. 7. Repeat these steps using your other leg. Side bridge 1. Lie on your side with your knees bent. Prop yourself up on the elbow that is near the floor. 2. Using your abdominal muscles and your elbow that is on the floor, raise your body off the floor. Raise your hip so that your shoulder, hip, and foot form a straight line together. 3. Hold this position for 10 seconds. Keep your head and neck raised and away from your shoulder (in their normal, neutral position). Keep your abdominal muscles tight. 4. Slowly lower your hip to the starting position. 5. Repeat and try to hold this position longer, working your way up to 30 seconds. Abdominal crunch 1. Lie on your back on a firm surface. Bend your knees and keep your feet flat on the floor. 2. Cross your arms over your chest. 3. Without bending your neck, tip your chin slightly toward your chest. 4. Tighten your abdominal muscles as you lift your chest just high enough to lift your shoulder blades off of the floor. Do not hold your breath. You can do this with short lifts or long lifts. 5. Slowly return to the starting position. Bird dog 1. Get on your hands and knees, with your legs shoulder-width apart and your arms under your shoulders. Keep your back straight. 2.  Tighten your abdominal muscles. 3. Raise one of your legs off the floor and straighten it. Try to keep it parallel to the floor. 4. Slowly lower your leg to the starting position. 5. Raise one of your arms off the floor and straighten it. Try to keep it parallel to the floor. 6. Slowly lower your arm to the starting position. 7. Repeat with the other arm and leg. If possible, try raising a leg and arm at the same time, on opposite sides of the body. For example, raise your left hand  and your right leg. Plank 1. Lie on your belly. 2. Prop up your body onto your forearms and your feet, keeping your legs straight. Your body should make a straight line between your shoulders and feet. 3. Hold this position for 10 seconds while keeping your abdominal muscles tight. 4. Lower your body to the starting position. 5. Repeat and try to hold this position longer, working your way up to 30 seconds. Cross-core strengthening 1. Stand with your feet shoulder-width apart. 2. Hold a ball out in front of you. Keep your arms straight. 3. Tighten your abdominal muscles and slowly rotate at your waist from side to side. Keep your feet flat. 4. Once you are comfortable, try repeating this exercise with a heavier ball. Top core strengthening 1. Stand about 18 inches (46 cm) in front of a wall, with your back to the wall. 2. Keep your feet flat and shoulder-width apart. 3. Tighten your abdominal muscles. 4. Bend your hips and knees. 5. Slowly reach between your legs to touch the wall behind you. 6. Slowly stand back up. 7. Raise your arms over your head and reach behind you. 8. Return to the starting position. General tips  Do not do any exercises that cause pain. If you have pain while exercising, talk to your health care provider.  Always stretch before and after doing these exercises. This can help prevent injury.  Maintain a healthy weight. Ask your health care provider what weight is healthy for you. Contact a health care provider if:  You have back pain that gets worse or does not go away.  You feel pain while doing core strength exercises. Get help right away if:  You have severe pain that does not get better with medicine. Summary  Core exercises help to build strength in the muscles between your ribs and your waist.  Core muscles help to support your body and keep your spine stable.  Some activities, such as yoga and Pilates, can help to strengthen core  muscles.  Core strength exercises can help back pain and can prevent injury.  If you feel any pain while doing core strength exercises, stop. This information is not intended to replace advice given to you by your health care provider. Make sure you discuss any questions you have with your health care provider. Document Released: 05/21/2016 Document Revised: 05/21/2016 Document Reviewed: 05/21/2016 Elsevier Interactive Patient Education  2019 [place]. Core Strength Exercises  Core exercises help to build strength in the muscles between your ribs and your hips (abdominal muscles). These muscles help to support your body and keep your spine stable. It is important to maintain strength in your core to prevent injury and pain. Some activities, such as yoga and Pilates, can help to strengthen core muscles. You can also strengthen core muscles with exercises at home. It is important to talk to your health care provider before you start a new exercise routine. What are the  benefits of core strength exercises? Core strength exercises can:  Reduce back pain.  Help to rebuild strength after a back or spine injury.  Help to prevent injury during physical activity, especially injuries to the back and knees. How to do core strength exercises Repeat these exercises 10-15 times, or until you are tired. Do exercises exactly as told by your health care provider and adjust them as directed. It is normal to feel mild stretching, pulling, tightness, or discomfort as you do these exercises. If you feel any pain while doing these exercises, stop. If your pain continues or gets worse when doing core exercises, contact your health care provider. You may want to use a padded yoga or exercise mat for strength exercises that are done on the floor. Bridging  6. Lie on your back on a firm surface with your knees bent and your feet flat on the floor. 7. Raise your hips so that your knees, hips, and shoulders form  a straight line together. Keep your abdominal muscles tight. 8. Hold this position for 3-5 seconds. 9. Slowly lower your hips to the starting position. 10. Let your muscles relax completely between repetitions. Single-leg bridge 8. Lie on your back on a firm surface with your knees bent and your feet flat on the floor. 9. Raise your hips so that your knees, hips, and shoulders form a straight line together. Keep your abdominal muscles tight. 10. Lift one foot off the floor, then completely straighten that leg. 11. Hold this position for 3-5 seconds. 12. Put the straight leg back down in the bent position. 13. Slowly lower your hips to the starting position. 14. Repeat these steps using your other leg. Side bridge 6. Lie on your side with your knees bent. Prop yourself up on the elbow that is near the floor. 7. Using your abdominal muscles and your elbow that is on the floor, raise your body off the floor. Raise your hip so that your shoulder, hip, and foot form a straight line together. 8. Hold this position for 10 seconds. Keep your head and neck raised and away from your shoulder (in their normal, neutral position). Keep your abdominal muscles tight. 9. Slowly lower your hip to the starting position. 10. Repeat and try to hold this position longer, working your way up to 30 seconds. Abdominal crunch 6. Lie on your back on a firm surface. Bend your knees and keep your feet flat on the floor. 7. Cross your arms over your chest. 8. Without bending your neck, tip your chin slightly toward your chest. 9. Tighten your abdominal muscles as you lift your chest just high enough to lift your shoulder blades off of the floor. Do not hold your breath. You can do this with short lifts or long lifts. 10. Slowly return to the starting position. Bird dog 8. Get on your hands and knees, with your legs shoulder-width apart and your arms under your shoulders. Keep your back straight. 9. Tighten your  abdominal muscles. 10. Raise one of your legs off the floor and straighten it. Try to keep it parallel to the floor. 11. Slowly lower your leg to the starting position. 12. Raise one of your arms off the floor and straighten it. Try to keep it parallel to the floor. 13. Slowly lower your arm to the starting position. 14. Repeat with the other arm and leg. If possible, try raising a leg and arm at the same time, on opposite sides of the body. For example,  raise your left hand and your right leg. Plank 6. Lie on your belly. 7. Prop up your body onto your forearms and your feet, keeping your legs straight. Your body should make a straight line between your shoulders and feet. 8. Hold this position for 10 seconds while keeping your abdominal muscles tight. 9. Lower your body to the starting position. 10. Repeat and try to hold this position longer, working your way up to 30 seconds. Cross-core strengthening 5. Stand with your feet shoulder-width apart. 6. Hold a ball out in front of you. Keep your arms straight. 7. Tighten your abdominal muscles and slowly rotate at your waist from side to side. Keep your feet flat. 8. Once you are comfortable, try repeating this exercise with a heavier ball. Top core strengthening 9. Stand about 18 inches (46 cm) in front of a wall, with your back to the wall. 10. Keep your feet flat and shoulder-width apart. 11. Tighten your abdominal muscles. 12. Bend your hips and knees. 13. Slowly reach between your legs to touch the wall behind you. 14. Slowly stand back up. 15. Raise your arms over your head and reach behind you. 16. Return to the starting position. General tips  Do not do any exercises that cause pain. If you have pain while exercising, talk to your health care provider.  Always stretch before and after doing these exercises. This can help prevent injury.  Maintain a healthy weight. Ask your health care provider what weight is healthy for  you. Contact a health care provider if:  You have back pain that gets worse or does not go away.  You feel pain while doing core strength exercises. Get help right away if:  You have severe pain that does not get better with medicine. Summary  Core exercises help to build strength in the muscles between your ribs and your waist.  Core muscles help to support your body and keep your spine stable.  Some activities, such as yoga and Pilates, can help to strengthen core muscles.  Core strength exercises can help back pain and can prevent injury.  If you feel any pain while doing core strength exercises, stop. This information is not intended to replace advice given to you by your health care provider. Make sure you discuss any questions you have with your health care provider. Document Released: 05/21/2016 Document Revised: 05/21/2016 Document Reviewed: 05/21/2016 Elsevier Interactive Patient Education  2019 [employer].

## 2018-03-16 NOTE — Transfer of Care (Signed)
Immediate Anesthesia Transfer of Care Note  Patient: Samantha Aguirre  Procedure(s) Performed: KYPHOPLASTY, L5 (N/A )  Patient Location: PACU  Anesthesia Type:General  Level of Consciousness: awake, alert , oriented and patient cooperative  Airway & Oxygen Therapy: Patient Spontanous Breathing and Patient connected to nasal cannula oxygen  Post-op Assessment: Report given to RN and Post -op Vital signs reviewed and stable  Post vital signs: Reviewed and stable  Last Vitals:  Vitals Value Taken Time  BP 118/69 03/16/2018  7:00 PM  Temp 36.5 C 03/16/2018  7:00 PM  Pulse 63 03/16/2018  7:07 PM  Resp 14 03/16/2018  7:07 PM  SpO2 99 % 03/16/2018  7:07 PM  Vitals shown include unvalidated device data.  Last Pain:  Vitals:   03/16/18 1712  TempSrc: Oral  PainSc:          Complications: No apparent anesthesia complications

## 2018-03-16 NOTE — Anesthesia Preprocedure Evaluation (Signed)
Anesthesia Evaluation  Patient identified by MRN, date of birth, ID band Patient awake    Reviewed: Allergy & Precautions, NPO status , Patient's Chart, lab work & pertinent test results, reviewed documented beta blocker date and time   History of Anesthesia Complications Negative for: history of anesthetic complications  Airway Mallampati: III  TM Distance: >3 FB     Dental  (+) Chipped, Partial Upper, Edentulous Lower   Pulmonary shortness of breath and with exertion, asthma , COPD,  COPD inhaler, neg recent URI, Current Smoker,           Cardiovascular hypertension, Pt. on medications (-) angina+ CAD, + Past MI, + Cardiac Stents, + Peripheral Vascular Disease and +CHF  (-) CABG + dysrhythmias Atrial Fibrillation + Valvular Problems/Murmurs      Neuro/Psych  Headaches, Seizures -, Well Controlled,  PSYCHIATRIC DISORDERS Depression CVA, Residual Symptoms    GI/Hepatic Neg liver ROS, GERD  ,  Endo/Other  diabetes, Type 2Hypothyroidism   Renal/GU negative Renal ROS     Musculoskeletal  (+) Arthritis ,   Abdominal   Peds  Hematology  (+) Blood dyscrasia, anemia ,   Anesthesia Other Findings Past Medical History: No date: Acid reflux No date: Arthritis     Comment:  Bilateral knees, hands, feet No date: Asthma No date: Atrial fibrillation (HCC) No date: Cancer (HCC) No date: Chest pain No date: CHF (congestive heart failure) (HCC)     Comment:  Diastolic CHF No date: COPD (chronic obstructive pulmonary disease) (HCC) No date: Coronary artery disease No date: Diabetes mellitus without complication (HCC) No date: Frequent headaches No date: Heart attack ([place]) No date: High cholesterol No date: HLD (hyperlipidemia) No date: Hypertension No date: MI (myocardial infarction) ([place]) No date: Seizures ([place]) 2015: Skin cancer     Comment:  Suspected basal cell carcinoma on nose, surgically               removed No  date: Stroke ([place]) No date: Thyroid disease No date: Tremors of nervous system   Reproductive/Obstetrics negative OB ROS                             Anesthesia Physical  Anesthesia Plan  ASA: III  Anesthesia Plan: General   Post-op Pain Management:    Induction: Intravenous  PONV Risk Score and Plan: 2 and Propofol infusion and TIVA  Airway Management Planned: Nasal Cannula and Natural Airway  Additional Equipment:   Intra-op Plan:   Post-operative Plan:   Informed Consent: I have reviewed the patients History and Physical, chart, labs and discussed the procedure including the risks, benefits and alternatives for the proposed anesthesia with the patient or authorized representative who has indicated his/her understanding and acceptance.       Plan Discussed with: CRNA  Anesthesia Plan Comments:         Anesthesia Quick Evaluation

## 2018-03-16 NOTE — Progress Notes (Signed)
Patient continues to have severe low back pain and is to go back for L5 kyphoplasty now.

## 2018-03-16 NOTE — Progress Notes (Addendum)
Samantha Aguirre at [facility] NAME: Samantha Aguirre    MR#:  [id number]  DATE OF BIRTH:  [birth year]  SUBJECTIVE:  CHIEF COMPLAINT:   Continues to complain of severe low back pain, for L5 kyphoplasty later today by orthopedic surgery   REVIEW OF SYSTEMS:  CONSTITUTIONAL: No fever, fatigue or weakness.  EYES: No blurred or double vision.  EARS, NOSE, AND THROAT: No tinnitus or ear pain.  RESPIRATORY: No cough, shortness of breath, wheezing or hemoptysis.  CARDIOVASCULAR: No chest pain, orthopnea, edema.  GASTROINTESTINAL: No nausea, vomiting, diarrhea or abdominal pain.  GENITOURINARY: No dysuria, hematuria.  ENDOCRINE: No polyuria, nocturia,  HEMATOLOGY: No anemia, easy bruising or bleeding SKIN: No rash or lesion. MUSCULOSKELETAL: No joint pain or arthritis.   NEUROLOGIC: No tingling, numbness, weakness.  PSYCHIATRY: No anxiety or depression.   ROS  DRUG ALLERGIES:   Allergies  Allergen Reactions  . Ivp Dye [Iodinated Diagnostic Agents] Itching    Pt injected with IV contrast.  5 min after injection pt complained of itching behind ear and on her abd.  . Methotrexate Rash  . Morphine And Related Other (See Comments)    "stops my breathing" NEAR-RESPIRATORY ARREST  . Mushroom Extract Complex Anaphylaxis    Made patient "deathly sick"  . Atenolol Other (See Comments)    "MD took me off of it bc it was doing something wrong" Made patient HYPOTENSIVE  . Percocet [Oxycodone-Acetaminophen] Nausea And Vomiting and Other (See Comments)    Stomach pains  . Percodan [Oxycodone-Aspirin] Nausea And Vomiting and Other (See Comments)    Stomach pains  . Tramadol Nausea Only  . Verapamil Other (See Comments)    " MD told me this was screwing me up" MAKES PATIENT HYPOTENSIVE  . Oxycodone Hcl     VITALS:  Blood pressure (!) 110/58, pulse (!) 55, temperature 98.3 F (36.8 C), temperature source Oral, resp. rate 17, height 5\' 1"  (1.549 m), weight  70.8 kg, SpO2 91 %.  PHYSICAL EXAMINATION:  GENERAL:  73 y.o.-year-old patient lying in the bed with no acute distress.  EYES: Pupils equal, round, reactive to light and accommodation. No scleral icterus. Extraocular muscles intact.  HEENT: Head atraumatic, normocephalic. Oropharynx and nasopharynx clear.  NECK:  Supple, no jugular venous distention. No thyroid enlargement, no tenderness.  LUNGS: Normal breath sounds bilaterally, no wheezing, rales,rhonchi or crepitation. No use of accessory muscles of respiration.  CARDIOVASCULAR: S1, S2 normal. No murmurs, rubs, or gallops.  ABDOMEN: Soft, nontender, nondistended. Bowel sounds present. No organomegaly or mass.  EXTREMITIES: No pedal edema, cyanosis, or clubbing.  NEUROLOGIC: Cranial nerves II through XII are intact. Muscle strength 5/5 in all extremities. Sensation intact. Gait not checked.  PSYCHIATRIC: The patient is alert and oriented x 3.  SKIN: No obvious rash, lesion, or ulcer.   Physical Exam LABORATORY PANEL:   CBC Recent Labs  Lab 03/14/18 0522  WBC 6.6  HGB 12.1  HCT 37.4  PLT 244   ------------------------------------------------------------------------------------------------------------------  Chemistries  Recent Labs  Lab 03/13/18 0826 03/14/18 0522  NA 136 136  K 2.7* 4.4  CL 99 103  CO2 25 25  GLUCOSE 132* 117*  BUN 9 11  CREATININE 0.69 0.60  CALCIUM 9.0 8.4*  MG 1.8  --   AST 22  --   ALT 16  --   ALKPHOS 123  --   BILITOT 0.6  --    ------------------------------------------------------------------------------------------------------------------  Cardiac Enzymes No results for input(s): TROPONINI in  the last 168 hours. ------------------------------------------------------------------------------------------------------------------  RADIOLOGY:  No results found.  ASSESSMENT AND PLAN:  *Acute Left facial droop, eyelid droop, problems with speech Resolved  Treated on our CVA/TIA protocol,  neurology input appreciated-aspirin/Plavix for 1 month-after that can go back on aspirin daily, MRI of the brain/carotid Dopplers negative, echocardiogram was a normal study, statin therapy Patient is hesitant on increasing anticoagulation with GI bleed last year with AVMs.  PT/OT/speech therapy to evaluate/treat  *Acute worsening L1 compression fracture Orthopedic surgery input appreciated-for vertebroplasty later today by Dr. Rudene Aguirre  Physical therapy did recommend home health PT status post discharge but patient does continue complain of exquisite plane low back with ambulation/standing   *Hypokalemia Repleted  *Diarrhea  Resolved  GI panel negative  *Relative hypotension Resolved Normal blood pressures noted Continue to hold antihypertensive agents  *Chronic diabetes mellitus type 2 Controlled Continue heart healthy/carbohydrate consistent diet, sliding scale insulin with Accu-Cheks per routine  *Epilepsy Stable Continue seizure precautions Keppra   *Chronic hypothyroidism, unspecified  Stable  Continue levothyroxine  *Chronic hyperlipidemia, unspecified Stable Continue statin therapy  *Chronic tobacco smoker abuse/dependency Nicotine patch and cessation counseling ordered  Disposition Home in 1 day orthopedic surgery/physical therapy clearance  All the records are reviewed and case discussed with Care Management/Social Workerr. Management plans discussed with the patient, family and they are in agreement.  CODE STATUS: dnr  TOTAL TIME TAKING CARE OF THIS PATIENT: 35 minutes.   POSSIBLE D/C IN 1 DAYS, DEPENDING ON CLINICAL CONDITION.   Samantha Aguirre M.D on 03/16/2018   Between 7am to 6pm - Pager - [phone]  After 6pm go to [email or web address] - password [email or web address] [facility]  Office  [phone]  CC: Primary care physician; Samantha Harrier, MD  Note: This dictation was prepared with Dragon dictation along with smaller phrase  technology. Any transcriptional errors that result from this process are unintentional.

## 2018-03-16 NOTE — Anesthesia Procedure Notes (Signed)
Date/Time: 03/16/2018 4:15 PM Performed by: Allean Found, CRNA Pre-anesthesia Checklist: Patient identified, Emergency Drugs available, Suction available, Patient being monitored and Timeout performed Oxygen Delivery Method: Nasal cannula Placement Confirmation: positive ETCO2

## 2018-03-17 ENCOUNTER — Inpatient Hospital Stay: Payer: Medicare Other

## 2018-03-17 ENCOUNTER — Encounter: Payer: Self-pay | Admitting: Orthopedic Surgery

## 2018-03-17 LAB — GLUCOSE, CAPILLARY
Glucose-Capillary: 151 mg/dL — ABNORMAL HIGH (ref 70–99)
Glucose-Capillary: 127 mg/dL — ABNORMAL HIGH (ref 70–99)

## 2018-03-17 MED ORDER — ASPIRIN 81 MG PO CHEW: 81.0000 mg | CHEWABLE_TABLET | Freq: Every day | ORAL | 0 refills | Status: DC

## 2018-03-17 MED ORDER — CLOPIDOGREL BISULFATE 75 MG PO TABS: 75.0000 mg | ORAL_TABLET | Freq: Every day | ORAL | 11 refills | Status: DC

## 2018-03-17 NOTE — Plan of Care (Signed)
  Problem: Education: Goal: Knowledge of disease or condition will improve Outcome: Progressing Goal: Knowledge of secondary prevention will improve Outcome: Progressing Goal: Knowledge of patient specific risk factors addressed and post discharge goals established will improve Outcome: Progressing   Problem: Coping: Goal: Will verbalize positive feelings about self Outcome: Progressing Goal: Will identify appropriate support needs Outcome: Progressing   Problem: Nutrition: Goal: Risk of aspiration will decrease Outcome: Progressing   Problem: Education: Goal: Knowledge of General Education information will improve Description Including pain rating scale, medication(s)/side effects and non-pharmacologic comfort measures Outcome: Progressing   Problem: Clinical Measurements: Goal: Ability to maintain clinical measurements within normal limits will improve Outcome: Progressing Goal: Will remain free from infection Outcome: Progressing Goal: Diagnostic test results will improve Outcome: Progressing Goal: Respiratory complications will improve Outcome: Progressing Goal: Cardiovascular complication will be avoided Outcome: Progressing   Problem: Pain Managment: Goal: General experience of comfort will improve Outcome: Progressing   Problem: Safety: Goal: Ability to remain free from injury will improve Outcome: Progressing

## 2018-03-17 NOTE — Care Management (Signed)
Discharge to home today per Dr. Benjie Karvonen. Unable to get transportation home. Will issue taxi voucher. Will be followed by [facility].Cassie, [facility] representative updated. Shelbie Ammons RN MSN CCM Care Management [phone]

## 2018-03-17 NOTE — Progress Notes (Signed)
Physical Therapy Treatment Patient Details Name: Samantha Aguirre MRN: [id number] DOB: Apr 04, 1945 Today's Date: 03/17/2018    History of Present Illness From MD H&P:  Pt is a 73 y.o. female who woke up with left facial mouth drooping and her left eyelid could not decide whether he wanted to be closed or open.  She is also had a history of stroke in the past and she has difficulty getting her words out.  Pt also went to orthopedics and was diagnosed with a compression fracture of the lumbar spine and they ordered an MRI.  Assessment includes: MRI of the brain negative, slight progression of L1 compression fracture, acute on chronic L5 compression fracture with 40% height loss, hypokalemia, diarrhea, hypotension, and h/o epilepsy.   Pt now s/p L5 kyphoplasty 03/16/18.     PT Comments    Pt presents with min deficits in strength, transfers, mobility, gait, and activity tolerance but overall performed well during the session.  Pt reported feeling much less pain after surgery until had a slip and fall with staff earlier this morning.  All imaging negative but pt endorsed some increase in back pain after the fall but still less than pre-kyphoplasty levels.  Pt fell while wearing slippers with questionable traction on the bottom of them.  Grip socks donned during this session with no noted instability.  Pt was min A with bed mobility using log roll technique and SBA with transfers with good control and stability.  Pt was able to amb 36' with a RW and CGA/SBA with good stability with cues for upright posture and amb closer to the RW.  Pt will benefit from HHPT services upon discharge to safely address above deficits for decreased caregiver assistance and eventual return to PLOF.      Follow Up Recommendations  Home health PT     Equipment Recommendations  None recommended by PT    Recommendations for Other Services       Precautions / Restrictions Precautions Precautions: Back Precaution Comments:  Seizure precautions Restrictions Weight Bearing Restrictions: No    Mobility  Bed Mobility Overal bed mobility: Needs Assistance Bed Mobility: Sit to Sidelying;Sidelying to Sit   Sidelying to sit: Min assist     Sit to sidelying: Min assist General bed mobility comments: Log roll technique training with mod verbal cues for sequencing  Transfers Overall transfer level: Needs assistance Equipment used: Rolling walker (2 wheeled) Transfers: Sit to/from Stand Sit to Stand: Supervision         General transfer comment: Sit to/from stand transfers from various height surfaces with mod verbal cues for sequencing.  Ambulation/Gait Ambulation/Gait assistance: Min guard Gait Distance (Feet): 80 Feet Assistive device: Rolling walker (2 wheeled) Gait Pattern/deviations: Step-through pattern;Decreased step length - right;Decreased step length - left Gait velocity: decreased   General Gait Details: Min verbal cues for upright posture with amb closer to the RW; slow, cautious cadence and short B step length but steady without LOB   Stairs             Wheelchair Mobility    Modified Rankin (Stroke Patients Only)       Balance Overall balance assessment: No apparent balance deficits (not formally assessed)                                          Cognition Arousal/Alertness: Awake/alert Behavior During Therapy: [facility] for tasks  assessed/performed Overall Cognitive Status: Within Functional Limits for tasks assessed                                        Exercises Total Joint Exercises Ankle Circles/Pumps: AROM;Both;10 reps Quad Sets: Strengthening;Both;10 reps Gluteal Sets: Strengthening;Both;10 reps Heel Slides: AROM;Both;5 reps Long Arc Quad: Strengthening;Both;10 reps Knee Flexion: Strengthening;Both;10 reps Marching in Standing: AROM;Both;10 reps;Standing Other Exercises Other Exercises: Bed mobility training using log roll  technique Other Exercises: HEP education/review for BLE APs, QS, GS, and LAQs x 10 each every 1-2 hours daily    General Comments        Pertinent Vitals/Pain Pain Assessment: 0-10 Pain Score: 6  Pain Location: Low back  Pain Descriptors / Indicators: Aching;Sore Pain Intervention(s): Premedicated before session;Monitored during session    Home Living                      Prior Function            PT Goals (current goals can now be found in the care plan section) Progress towards PT goals: Progressing toward goals    Frequency    7X/week      PT Plan Frequency needs to be updated    Co-evaluation              AM-PAC PT "6 Clicks" Mobility   Outcome Measure  Help needed turning from your back to your side while in a flat bed without using bedrails?: A Little Help needed moving from lying on your back to sitting on the side of a flat bed without using bedrails?: A Little Help needed moving to and from a bed to a chair (including a wheelchair)?: A Little Help needed standing up from a chair using your arms (e.g., wheelchair or bedside chair)?: A Little Help needed to walk in hospital room?: A Little Help needed climbing 3-5 steps with a railing? : A Little 6 Click Score: 18    End of Session Equipment Utilized During Treatment: Gait belt Activity Tolerance: Patient tolerated treatment well Patient left: in chair;with chair alarm set;with call bell/phone within reach Nurse Communication: Mobility status PT Visit Diagnosis: Muscle weakness (generalized) (M62.81);History of falling (Z91.81);Difficulty in walking, not elsewhere classified (R26.2);Pain Pain - part of body: (low back )     Time: 1438-8875 PT Time Calculation (min) (ACUTE ONLY): 25 min  Charges:  $Gait Training: 8-22 mins $Therapeutic Exercise: 8-22 mins                     D. Scott Jorel Gravlin PT, DPT 03/17/18, 1:05 PM

## 2018-03-17 NOTE — Progress Notes (Addendum)
Patient had a witnessed fall at 0730. [employer] student and nurse tech in room with patient. MD, [facility] and charge nurse notified. VSS and MD ordered stat xray of hip. Daughter also notified.

## 2018-03-17 NOTE — Progress Notes (Signed)
Patient discharged home per MD orders. All discharge instructions given to patient and all questions answered. Taxi voucher given to patient. Patient and daughter advised that patient should have help getting into the house and patient refused. Taxi called for transportation.

## 2018-03-17 NOTE — Discharge Summary (Signed)
[facility] [place] NAME: Samantha Aguirre    MR#:  [id number]  DATE OF BIRTH:  22-Jun-1945  DATE OF ADMISSION:  03/13/2018 ADMITTING PHYSICIAN: Loletha Grayer, MD  DATE OF DISCHARGE: 03/17/2018  PRIMARY CARE PHYSICIAN: Tracie Harrier, MD    ADMISSION DIAGNOSIS:  Pain [R52] Facial droop [R29.810] Cerebrovascular accident (CVA), unspecified mechanism ([place]) [I63.9]  DISCHARGE DIAGNOSIS:  Active Problems:   Speaking difficulty   Closed fracture of lumbar vertebral body ([place])   Malnutrition of moderate degree   SECONDARY DIAGNOSIS:   Past Medical History:  Diagnosis Date  . Acid reflux   . Arthritis    Bilateral knees, hands, feet  . Asthma   . Atrial fibrillation ([place])   . Cancer ([place])   . CHF (congestive heart failure) (H[facility])    Diastolic CHF  . COPD (chronic obstructive pulmonary disease) ([place])   . Coronary artery disease   . Diabetes mellitus without complication ([place])   . Frequent headaches   . GI bleed   . HLD (hyperlipidemia)   . Hypertension   . Seizures ([place])   . Skin cancer 2015   Suspected basal cell carcinoma on nose, surgically removed  . Stroke ([place]) 04/2017  . Thyroid disease   . Tremors of nervous system     HOSPITAL COURSE:   73 year old female with history of diabetes who presented with left facial droop, slurred speech and headache.  1.  TIA: Patient symptoms consistent with TIA.  Patient was evaluated by neurology while in the hospital.  MRI showed no acute changes.  Carotid Doppler showed no evidence of hemodynamically significant stenosis.  Echocardiogram showed no evidence of thrombus.  Her hemoglobin A1c is 6.0 and LDL 73.  Patient was encouraged to stop smoking but is unwilling at this time. She will continue on aspirin and Plavix for 1 month.  Patient then to return to aspirin daily alone.  2.  Acute L5 compression fraction: Patient is postoperative day #1 kyphoplasty.  3.  Hypokalemia: This  was repleted  4.  Diabetes: Continue ADA diet and outpatient regimen.  5.  Essential hypertension: Continue lisinopril  6.  Hyperlipidemia: Continue statin  7.  Hypothyroidism: Continue Synthroid  8.  Seizure disorder: Continue Keppra  DISCHARGE CONDITIONS AND DIET:  Stable for discharge on heart healthy diabetic diet   CONSULTS OBTAINED:  Treatment Team:  Hessie Knows, MD  DRUG ALLERGIES:   Allergies  Allergen Reactions  . Ivp Dye [Iodinated Diagnostic Agents] Itching    Pt injected with IV contrast.  5 min after injection pt complained of itching behind ear and on her abd.  . Methotrexate Rash  . Morphine And Related Other (See Comments)    "stops my breathing" NEAR-RESPIRATORY ARREST  . Mushroom Extract Complex Anaphylaxis    Made patient "deathly sick"  . Atenolol Other (See Comments)    "MD took me off of it bc it was doing something wrong" Made patient HYPOTENSIVE  . Percocet [Oxycodone-Acetaminophen] Nausea And Vomiting and Other (See Comments)    Stomach pains  . Percodan [Oxycodone-Aspirin] Nausea And Vomiting and Other (See Comments)    Stomach pains  . Tramadol Nausea Only  . Verapamil Other (See Comments)    " MD told me this was screwing me up" MAKES PATIENT HYPOTENSIVE  . Oxycodone Hcl     DISCHARGE MEDICATIONS:   Allergies as of 03/17/2018      Reactions   Ivp Dye [iodinated Diagnostic Agents] Itching  Pt injected with IV contrast.  5 min after injection pt complained of itching behind ear and on her abd.   Methotrexate Rash   Morphine And Related Other (See Comments)   "stops my breathing" NEAR-RESPIRATORY ARREST   Mushroom Extract Complex Anaphylaxis   Made patient "deathly sick"   Atenolol Other (See Comments)   "MD took me off of it bc it was doing something wrong" Made patient HYPOTENSIVE   Percocet [oxycodone-acetaminophen] Nausea And Vomiting, Other (See Comments)   Stomach pains   Percodan [oxycodone-aspirin] Nausea And Vomiting,  Other (See Comments)   Stomach pains   Tramadol Nausea Only   Verapamil Other (See Comments)   " MD told me this was screwing me up" MAKES PATIENT HYPOTENSIVE   Oxycodone Hcl       Medication List    STOP taking these medications   aspirin 325 MG tablet Replaced by:  aspirin 81 MG chewable tablet     TAKE these medications   albuterol 108 (90 Base) MCG/ACT inhaler Commonly known as:  PROVENTIL HFA;VENTOLIN HFA Inhale 2 puffs into the lungs every 6 (six) hours as needed for wheezing or shortness of breath.   aspirin 81 MG chewable tablet Commonly known as:  ASPIRIN CHILDRENS Chew 1 tablet (81 mg total) by mouth daily. Replaces:  aspirin 325 MG tablet   atorvastatin 80 MG tablet Commonly known as:  LIPITOR Take 1 tablet (80 mg total) by mouth daily at 6 PM.   blood glucose meter kit and supplies Dispense based on patient and insurance preference. Use up to four times daily as directed. (FOR ICD-10 E10.9, E11.9).   clopidogrel 75 MG tablet Commonly known as:  PLAVIX Take 1 tablet (75 mg total) by mouth daily.   colestipol 1 g tablet Commonly known as:  COLESTID Take 2 g by mouth 2 (two) times daily.   diclofenac sodium 1 % Gel Commonly known as:  VOLTAREN Apply 2 g topically 4 (four) times daily.   DULoxetine 60 MG capsule Commonly known as:  CYMBALTA Take 1 capsule (60 mg total) by mouth at bedtime.   ferrous sulfate 325 (65 FE) MG EC tablet Take 1 tablet (325 mg total) by mouth 2 (two) times daily with a meal. What changed:  when to take this   fluticasone 50 MCG/ACT nasal spray Commonly known as:  FLONASE Place 1 spray into both nostrils daily.   furosemide 20 MG tablet Commonly known as:  LASIX Take 1 tablet by mouth daily.   gabapentin 800 MG tablet Commonly known as:  NEURONTIN Take 1 tablet (800 mg total) by mouth 2 (two) times daily.   glipiZIDE-metformin 5-500 MG tablet Commonly known as:  METAGLIP Take 1 tablet by mouth 2 (two) times daily  before a meal.   levETIRAcetam 500 MG tablet Commonly known as:  KEPPRA Take 1 tablet (500 mg total) by mouth 2 (two) times daily.   levothyroxine 112 MCG tablet Commonly known as:  SYNTHROID, LEVOTHROID Take 1 tablet (112 mcg total) by mouth daily before breakfast.   lisinopril 20 MG tablet Commonly known as:  PRINIVIL,ZESTRIL Take 20 mg by mouth daily.   nitroGLYCERIN 0.4 MG SL tablet Commonly known as:  NITROSTAT Place 1 tablet (0.4 mg total) under the tongue every 5 (five) minutes as needed for chest pain.   omeprazole 20 MG capsule Commonly known as:  PRILOSEC Take 20 mg by mouth 2 (two) times daily.   sucralfate 1 g tablet Commonly known as:  CARAFATE TAKE (  1) TABLET BY MOUTH FOUR TIMES A DAY   umeclidinium bromide 62.5 MCG/INH Aepb Commonly known as:  INCRUSE ELLIPTA Inhale 1 puff into the lungs daily.   vitamin B-12 500 MCG tablet Commonly known as:  CYANOCOBALAMIN Take 1 tablet by mouth daily.         Today   CHIEF COMPLAINT:   Patient doing well this morning.  No back pain.   VITAL SIGNS:  Blood pressure (!) 106/57, pulse (!) 55, temperature 97.9 F (36.6 C), temperature source Oral, resp. rate 16, height _0  (1.549 m), weight 70.8 kg, SpO2 96 %.   REVIEW OF SYSTEMS:  Review of Systems  Constitutional: Negative.  Negative for chills, fever and malaise/fatigue.  HENT: Negative.  Negative for ear discharge, ear pain, hearing loss, nosebleeds and sore throat.   Eyes: Negative.  Negative for blurred vision and pain.  Respiratory: Negative.  Negative for cough, hemoptysis, shortness of breath and wheezing.   Cardiovascular: Negative.  Negative for chest pain, palpitations and leg swelling.  Gastrointestinal: Negative.  Negative for abdominal pain, blood in stool, diarrhea, nausea and vomiting.  Genitourinary: Negative.  Negative for dysuria.  Musculoskeletal: Negative.  Negative for back pain.  Skin: Negative.   Neurological: Negative for  dizziness, tremors, speech change, focal weakness, seizures and headaches.  Endo/Heme/Allergies: Negative.  Does not bruise/bleed easily.  Psychiatric/Behavioral: Negative.  Negative for depression, hallucinations and suicidal ideas.     PHYSICAL EXAMINATION:  GENERAL:  73 y.o.-year-old patient lying in the bed with no acute distress.  NECK:  Supple, no jugular venous distention. No thyroid enlargement, no tenderness.  LUNGS: Normal breath sounds bilaterally, no wheezing, rales,rhonchi  No use of accessory muscles of respiration.  CARDIOVASCULAR: S1, S2 normal. No murmurs, rubs, or gallops.  ABDOMEN: Soft, non-tender, non-distended. Bowel sounds present. No organomegaly or mass.  EXTREMITIES: No pedal edema, cyanosis, or clubbing.  PSYCHIATRIC: The patient is alert and oriented x 3.  SKIN: No obvious rash, lesion, or ulcer.   DATA REVIEW:   CBC Recent Labs  Lab 03/14/18 0522  WBC 6.6  HGB 12.1  HCT 37.4  PLT 244    Chemistries  Recent Labs  Lab 03/13/18 0826 03/14/18 0522  NA 136 136  K 2.7* 4.4  CL 99 103  CO2 25 25  GLUCOSE 132* 117*  BUN 9 11  CREATININE 0.69 0.60  CALCIUM 9.0 8.4*  MG 1.8  --   AST 22  --   ALT 16  --   ALKPHOS 123  --   BILITOT 0.6  --     Cardiac Enzymes No results for input(s): TROPONINI in the last 168 hours.  Microbiology Results  _1 @  RADIOLOGY:  Dg Lumbar Spine 2-3 Views  Result Date: 03/16/2018 CLINICAL DATA:  Kyphoplasty EXAM: LUMBAR SPINE - 2-3 VIEW; DG C-ARM 61-120 MIN COMPARISON:  MRI 03/13/2018 FINDINGS: Frontal and lateral views of the lumbar spine during an L5 kyphoplasty procedure are provided. Images demonstrate cement within the L5 vertebral body without immediate postoperative complicating features. Cumulative fluoroscopic time utilized was 1 minutes 48 seconds with 47.78 mGy of radiation utilized. IMPRESSION: Fluoroscopic time utilized during L5 kyphoplasty as above. Electronically Signed   By: Ashley Royalty M.D.    On: 03/16/2018 22:50   Dg C-arm 1-60 Min  Result Date: 03/16/2018 CLINICAL DATA:  Kyphoplasty EXAM: LUMBAR SPINE - 2-3 VIEW; DG C-ARM 61-120 MIN COMPARISON:  MRI 03/13/2018 FINDINGS: Frontal and lateral views of the lumbar spine during an L5 kyphoplasty  procedure are provided. Images demonstrate cement within the L5 vertebral body without immediate postoperative complicating features. Cumulative fluoroscopic time utilized was 1 minutes 48 seconds with 47.78 mGy of radiation utilized. IMPRESSION: Fluoroscopic time utilized during L5 kyphoplasty as above. Electronically Signed   By: Ashley Royalty M.D.   On: 03/16/2018 22:50   Dg Hip Unilat With Pelvis 2-3 Views Left  Result Date: 03/17/2018 CLINICAL DATA:  Trauma secondary to a fall this morning. EXAM: DG HIP (WITH OR WITHOUT PELVIS) 2-3V LEFT COMPARISON:  None. FINDINGS: There is no evidence of hip fracture or dislocation. Slight bilateral medial joint space narrowing of the hip joints. Previous L5 kyphoplasty. IMPRESSION: No acute abnormalities. Electronically Signed   By: Lorriane Shire M.D.   On: 03/17/2018 09:17   Dg Hip Unilat With Pelvis 2-3 Views Right  Result Date: 03/17/2018 CLINICAL DATA:  Trauma secondary to a fall this morning. EXAM: DG HIP (WITH OR WITHOUT PELVIS) 2-3V RIGHT COMPARISON:  None. FINDINGS: There is no evidence of hip fracture or dislocation. There is slight medial joint space narrowing. Kyphoplasty at L5. IMPRESSION: No acute abnormalities. Electronically Signed   By: Lorriane Shire M.D.   On: 03/17/2018 09:18      Allergies as of 03/17/2018      Reactions   Ivp Dye [iodinated Diagnostic Agents] Itching   Pt injected with IV contrast.  5 min after injection pt complained of itching behind ear and on her abd.   Methotrexate Rash   Morphine And Related Other (See Comments)   "stops my breathing" NEAR-RESPIRATORY ARREST   Mushroom Extract Complex Anaphylaxis   Made patient "deathly sick"   Atenolol Other (See Comments)    "MD took me off of it bc it was doing something wrong" Made patient HYPOTENSIVE   Percocet [oxycodone-acetaminophen] Nausea And Vomiting, Other (See Comments)   Stomach pains   Percodan [oxycodone-aspirin] Nausea And Vomiting, Other (See Comments)   Stomach pains   Tramadol Nausea Only   Verapamil Other (See Comments)   " MD told me this was screwing me up" MAKES PATIENT HYPOTENSIVE   Oxycodone Hcl       Medication List    STOP taking these medications   aspirin 325 MG tablet Replaced by:  aspirin 81 MG chewable tablet     TAKE these medications   albuterol 108 (90 Base) MCG/ACT inhaler Commonly known as:  PROVENTIL HFA;VENTOLIN HFA Inhale 2 puffs into the lungs every 6 (six) hours as needed for wheezing or shortness of breath.   aspirin 81 MG chewable tablet Commonly known as:  ASPIRIN CHILDRENS Chew 1 tablet (81 mg total) by mouth daily. Replaces:  aspirin 325 MG tablet   atorvastatin 80 MG tablet Commonly known as:  LIPITOR Take 1 tablet (80 mg total) by mouth daily at 6 PM.   blood glucose meter kit and supplies Dispense based on patient and insurance preference. Use up to four times daily as directed. (FOR ICD-10 E10.9, E11.9).   clopidogrel 75 MG tablet Commonly known as:  PLAVIX Take 1 tablet (75 mg total) by mouth daily.   colestipol 1 g tablet Commonly known as:  COLESTID Take 2 g by mouth 2 (two) times daily.   diclofenac sodium 1 % Gel Commonly known as:  VOLTAREN Apply 2 g topically 4 (four) times daily.   DULoxetine 60 MG capsule Commonly known as:  CYMBALTA Take 1 capsule (60 mg total) by mouth at bedtime.   ferrous sulfate 325 (65 FE) MG EC tablet  Take 1 tablet (325 mg total) by mouth 2 (two) times daily with a meal. What changed:  when to take this   fluticasone 50 MCG/ACT nasal spray Commonly known as:  FLONASE Place 1 spray into both nostrils daily.   furosemide 20 MG tablet Commonly known as:  LASIX Take 1 tablet by mouth daily.    gabapentin 800 MG tablet Commonly known as:  NEURONTIN Take 1 tablet (800 mg total) by mouth 2 (two) times daily.   glipiZIDE-metformin 5-500 MG tablet Commonly known as:  METAGLIP Take 1 tablet by mouth 2 (two) times daily before a meal.   levETIRAcetam 500 MG tablet Commonly known as:  KEPPRA Take 1 tablet (500 mg total) by mouth 2 (two) times daily.   levothyroxine 112 MCG tablet Commonly known as:  SYNTHROID, LEVOTHROID Take 1 tablet (112 mcg total) by mouth daily before breakfast.   lisinopril 20 MG tablet Commonly known as:  PRINIVIL,ZESTRIL Take 20 mg by mouth daily.   nitroGLYCERIN 0.4 MG SL tablet Commonly known as:  NITROSTAT Place 1 tablet (0.4 mg total) under the tongue every 5 (five) minutes as needed for chest pain.   omeprazole 20 MG capsule Commonly known as:  PRILOSEC Take 20 mg by mouth 2 (two) times daily.   sucralfate 1 g tablet Commonly known as:  CARAFATE TAKE (1) TABLET BY MOUTH FOUR TIMES A DAY   umeclidinium bromide 62.5 MCG/INH Aepb Commonly known as:  INCRUSE ELLIPTA Inhale 1 puff into the lungs daily.   vitamin B-12 500 MCG tablet Commonly known as:  CYANOCOBALAMIN Take 1 tablet by mouth daily.           Management plans discussed with the patient and she is in agreement. Stable for discharge home with [facility]  Patient should follow up with pcp  CODE STATUS:     Code Status Orders  (From admission, onward)         Start     Ordered   03/13/18 1042  Do not attempt resuscitation (DNR)  Continuous    Question Answer Comment  In the event of cardiac or respiratory ARREST Do not call a "code blue"   In the event of cardiac or respiratory ARREST Do not perform Intubation, CPR, defibrillation or ACLS   In the event of cardiac or respiratory ARREST Use medication by any route, position, wound care, and other measures to relive pain and suffering. May use oxygen, suction and manual treatment of airway obstruction as needed for comfort.    Comments nurse may pronounce      03/13/18 1042        Code Status History    Date Active Date Inactive Code Status Order ID Comments User Context   10/20/2017 1704 10/21/2017 2006 DNR [id number]  Epifanio Lesches, MD ED   09/22/2017 2207 09/24/2017 1732 DNR [id number]  Rise Patience, MD ED   07/30/2017 1420 08/03/2017 1655 DNR [id number]  Karmen Bongo, MD ED   07/06/2017 1313 07/11/2017 2202 DNR [id number]  Fritzi Mandes, MD Inpatient   04/03/2017 2010 04/08/2017 2102 DNR [id number]  Demetrios Loll, MD Inpatient   03/27/2017 1519 04/02/2017 1517 Full Code [id number]  Saundra Shelling, MD Inpatient   02/18/2017 1623 02/21/2017 1919 Full Code [id number]  Gorden Harms, MD Inpatient   10/03/2016 1841 10/04/2016 1739 Full Code [id number]  Demetrios Loll, MD Inpatient   02/27/2016 1744 02/28/2016 2034 Full Code [id number]  Vaughan Basta, MD Inpatient   02/19/2016 1553 02/21/2016 2041 Full Code [id number]  Gladstone Lighter, MD Inpatient   12/27/2015 1309 01/01/2016 1833 DNR [id number]  Loletha Grayer, MD ED   09/16/2015 1852 09/19/2015 1929 Full Code [id number]  Baxter Hire, MD Inpatient   08/16/2015 1714 08/19/2015 1735 DNR [id number]  Willia Craze, NP Inpatient    Advance Directive Documentation     Most Recent Value  Type of Advance Directive  Out of facility DNR (pink MOST or yellow form)  Pre-existing out of facility DNR order (yellow form or pink MOST form)  -  "MOST" Form in Place?  -      TOTAL TIME TAKING CARE OF THIS PATIENT: 38 minutes.    Note: This dictation was prepared with Dragon dictation along with smaller phrase technology. Any transcriptional errors that result from this process are unintentional.  Nimsi Males M.D on 03/17/2018 at 12:10 PM  Between 7am to 6pm - Pager - [phone] After 6pm go to [email or web address] - password [email or web address]  Office  [phone]  CC: Primary care physician; Tracie Harrier, MD

## 2018-03-17 NOTE — Evaluation (Addendum)
Occupational Therapy Evaluation Patient Details Name: Samantha Aguirre MRN: [id number] DOB: 07-09-1945 Today's Date: 03/17/2018    History of Present Illness From MD H&P:  Pt is a 73 y.o. female who woke up with left facial mouth drooping and her left eyelid could not decide whether he wanted to be closed or open.  She is also had a history of stroke in the past and she has difficulty getting her words out.  Pt also went to orthopedics and was diagnosed with a compression fracture of the lumbar spine and they ordered an MRI.  Assessment includes: MRI of the brain negative, slight progression of L1 compression fracture, acute on chronic L5 compression fracture with 40% height loss, hypokalemia, diarrhea, hypotension, and h/o epilepsy.   Pt now s/p L5 kyphoplasty 03/16/18.    Clinical Impression   [name] was seen for OT evaluation this date, POD#1 from L5 kyphoplasty. Prior to hospital admission, pt modified independent using a RW for household distances and was generally independent with ADLs. Pt endorses 2 falls in the past 12 months. Pt lives with alone in a single level home with family available to assist intermittently. Currently pt is contact guard to min assist with all aspects of mobility and self care tasks. Pt educated in back precautions, self care skills, AE, and home/routines modifications to maximize safety and functional independence while minimizing falls risk and maintaining precautions. Pt verbalized understanding of all education/training provided. Able to return demonstration safe techniques while maintaining back precautions throughout.  No additional skilled OT needs at this time. Will discharge from OT services. Please re-consult if additional needs arise. Upon hospital discharge recommend HHOT to maximize pt safety and functional independence in her home.     Follow Up Recommendations  Home health OT    Equipment Recommendations  3 in 1 bedside commode     Recommendations for Other Services       Precautions / Restrictions Precautions Precautions: Back Precaution Comments: Seizure precautions Required Braces or Orthoses: Spinal Brace Spinal Brace: Lumbar corset;Other (comment) Spinal Brace Comments: Lumbosacral corset when out of bed Restrictions Weight Bearing Restrictions: No      Mobility Bed Mobility Overal bed mobility: Needs Assistance Bed Mobility: Sit to Sidelying;Sidelying to Sit   Sidelying to sit: Min guard     Sit to sidelying: Min guard General bed mobility comments: Pt provided with VCs for log-roll technique. Able to demonstrate good technique for maintaining back precautions while completing bed mobility. Min guard provided for safety. Mod VCs for sequencing.   Transfers Overall transfer level: Needs assistance Equipment used: Rolling walker (2 wheeled) Transfers: Sit to/from Stand Sit to Stand: Supervision         General transfer comment: Sit to/from stand transfers from EOB with min VCs for sequencing.     Balance Overall balance assessment: No apparent balance deficits (not formally assessed)                                         ADL either performed or assessed with clinical judgement   ADL Overall ADL's : Needs assistance/impaired Eating/Feeding: Set up;Independent   Grooming: Set up;Independent;Sitting   Upper Body Bathing: Set up;Minimal assistance;Sitting   Lower Body Bathing: Set up;Minimal assistance;With adaptive equipment;Sit to/from stand;Adhering to back precautions   Upper Body Dressing : Set up;Independent   Lower Body Dressing: Set up;Minimal assistance;Sit to/from stand;Adhering to back precautions  Toilet Transfer: Set up;BSC;RW;Ambulation;Minimal assistance   Toileting- Clothing Manipulation and Hygiene: Minimal assistance;Adhering to back precautions;Sit to/from stand       Functional mobility during ADLs: Rolling walker;Minimal assistance General  ADL Comments: Pt limited by pain in back at this time. Able to return verbalize back precautions and considerations for ADLs.      Vision Baseline Vision/History: No visual deficits Patient Visual Report: No change from baseline;Blurring of vision;Diplopia Additional Comments: Pt endorsed both blurry & double vision at time of eval, but stated this has been an ongoing issue since prior to admission. Was unable to give timeframe; stated "it's just been a while". Does not appear to have significant impact on function at this time.      Perception     Praxis      Pertinent Vitals/Pain Pain Assessment: 0-10 Pain Score: 7  Pain Location: Low back  Pain Descriptors / Indicators: Aching;Sore;Grimacing Pain Intervention(s): Limited activity within patient's tolerance;Monitored during session;Premedicated before session;Patient requesting pain meds-RN notified;Repositioned;Utilized relaxation techniques     Hand Dominance Right   Extremity/Trunk Assessment Upper Extremity Assessment Upper Extremity Assessment: Overall WFL for tasks assessed;LUE deficits/detail LUE Deficits / Details: Pt with noted L sided weakness which she endorsed was baseline. Grossly 4-/5 in L UE with noted difference in L grip strength on this date.    Lower Extremity Assessment Lower Extremity Assessment: Generalized weakness;Defer to PT evaluation       Communication Communication Communication: No difficulties   Cognition Arousal/Alertness: Awake/alert Behavior During Therapy: WFL for tasks assessed/performed Overall Cognitive Status: Within Functional Limits for tasks assessed                                 General Comments: Pt with some difficulty with word finding at time of eval, but overall cognition was [facility]. Able to follow verbal prompts/directions.    General Comments       Exercises Total Joint Exercises Ankle Circles/Pumps: AROM;Both;10 reps Quad Sets: Strengthening;Both;10  reps Gluteal Sets: Strengthening;Both;10 reps Heel Slides: AROM;Both;5 reps Long Arc Quad: Strengthening;Both;10 reps Knee Flexion: Strengthening;Both;10 reps Marching in Standing: AROM;Both;10 reps;Standing Other Exercises Other Exercises: Bed mobility training using log roll technique Other Exercises: HEP education/review for BLE APs, QS, GS, and LAQs x 10 each every 1-2 hours daily Other Exercises: Pt educated on back procautions and log roll technique for bed mobility. Able to return verbalize/demonstrate. Other Exercises: Pt instructed on safe use of AE/DME for ADLs, falls prevention strategies, and routines modifications in order to improve safety and functional independence upon hospital DC.   Shoulder Instructions      Home Living Family/patient expects to be discharged to:: Private residence Living Arrangements: Alone Available Help at Discharge: Family;Friend(s);Available PRN/intermittently Type of Home: Apartment Home Access: Level entry     Home Layout: One level     Bathroom Shower/Tub: Teacher, early years/pre: Standard Bathroom Accessibility: Yes How Accessible: Accessible via walker Home Equipment: [place] - 2 wheels;Shower seat;Adaptive equipment;Cane - single point;Grab bars - tub/shower;Wheelchair - power [employer] Equipment: Reacher        Prior Functioning/Environment Level of Independence: Independent with assistive device(s)        Comments: Pt mod I for ambulation of HH distances; uses a RW. Endorses general independence with ADLs in the home, however prefers to have someone at home when she showers for safety. Family assist with heavy IADLs and shopping.  OT Problem List: Decreased strength;Decreased range of motion;Decreased activity tolerance;Decreased coordination;Decreased knowledge of use of DME or AE;Decreased safety awareness;Decreased knowledge of precautions;Pain      OT Treatment/Interventions:      OT Goals(Current  goals can be found in the care plan section) Acute Rehab OT Goals Patient Stated Goal: To move with less pain OT Goal Formulation: All assessment and education complete, DC therapy Time For Goal Achievement: 03/17/18 Potential to Achieve Goals: Good  OT Frequency:     Barriers to D/C:            Co-evaluation              AM-PAC OT "6 Clicks" Daily Activity     Outcome Measure Help from another person eating meals?: None Help from another person taking care of personal grooming?: None Help from another person toileting, which includes using toliet, bedpan, or urinal?: A Little Help from another person bathing (including washing, rinsing, drying)?: A Little Help from another person to put on and taking off regular upper body clothing?: A Little Help from another person to put on and taking off regular lower body clothing?: A Little 6 Click Score: 20   End of Session Equipment Utilized During Treatment: Gait belt;Rolling walker Nurse Communication: Patient requests pain meds  Activity Tolerance: Patient tolerated treatment well Patient left: in bed;with call bell/phone within reach;with bed alarm set;with SCD's reapplied  OT Visit Diagnosis: Other abnormalities of gait and mobility (R26.89);History of falling (Z91.81);Pain Pain - Right/Left: (back)                Time: 1240-1310 OT Time Calculation (min): 30 min Charges:  OT General Charges $OT Visit: 1 Visit OT Evaluation $OT Eval Low Complexity: 1 Low OT Treatments $Self Care/Home Management : 23-37 mins  Shara Blazing, M.S., OTR/L Ascom: [phone] 03/17/18, 1:53 PM

## 2018-03-17 NOTE — Anesthesia Postprocedure Evaluation (Signed)
Anesthesia Post Note  Patient: Analysa Nutting  Procedure(s) Performed: KYPHOPLASTY, L5 (N/A )  Patient location during evaluation: PACU Anesthesia Type: General Level of consciousness: awake and alert Pain management: pain level controlled Vital Signs Assessment: post-procedure vital signs reviewed and stable Respiratory status: spontaneous breathing, nonlabored ventilation, respiratory function stable and patient connected to nasal cannula oxygen Cardiovascular status: blood pressure returned to baseline and stable Postop Assessment: no apparent nausea or vomiting Anesthetic complications: no     Last Vitals:  Vitals:   03/17/18 0318 03/17/18 0551  BP: 139/65 106/66  Pulse: (!) 50 (!) 56  Resp: 19 (!) 22  Temp: 36.4 C (!) 36.3 C  SpO2: 95% 95%    Last Pain:  Vitals:   03/17/18 0551  TempSrc: Axillary  PainSc:                  Martha Clan

## 2018-03-18 ENCOUNTER — Emergency Department: Payer: Medicare Other

## 2018-03-18 ENCOUNTER — Observation Stay
Admission: EM | Admit: 2018-03-18 | Discharge: 2018-03-19 | Disposition: A | Discharge status: Home / Self Care | Payer: Medicare Other | Attending: Internal Medicine | Admitting: Internal Medicine

## 2018-03-18 ENCOUNTER — Other Ambulatory Visit: Payer: Self-pay

## 2018-03-18 ENCOUNTER — Encounter: Payer: Self-pay | Admitting: Emergency Medicine

## 2018-03-18 DIAGNOSIS — Z79899 Other long term (current) drug therapy: Secondary | ICD-10-CM | POA: Insufficient documentation

## 2018-03-18 DIAGNOSIS — E785 Hyperlipidemia, unspecified: Secondary | ICD-10-CM | POA: Diagnosis not present

## 2018-03-18 DIAGNOSIS — I5032 Chronic diastolic (congestive) heart failure: Secondary | ICD-10-CM | POA: Insufficient documentation

## 2018-03-18 DIAGNOSIS — S32050D Wedge compression fracture of fifth lumbar vertebra, subsequent encounter for fracture with routine healing: Secondary | ICD-10-CM | POA: Diagnosis not present

## 2018-03-18 DIAGNOSIS — Z9181 History of falling: Secondary | ICD-10-CM | POA: Diagnosis not present

## 2018-03-18 DIAGNOSIS — Z7982 Long term (current) use of aspirin: Secondary | ICD-10-CM | POA: Insufficient documentation

## 2018-03-18 DIAGNOSIS — Z79890 Hormone replacement therapy (postmenopausal): Secondary | ICD-10-CM | POA: Diagnosis not present

## 2018-03-18 DIAGNOSIS — E876 Hypokalemia: Secondary | ICD-10-CM | POA: Insufficient documentation

## 2018-03-18 DIAGNOSIS — R2689 Other abnormalities of gait and mobility: Secondary | ICD-10-CM | POA: Diagnosis not present

## 2018-03-18 DIAGNOSIS — M542 Cervicalgia: Secondary | ICD-10-CM | POA: Insufficient documentation

## 2018-03-18 DIAGNOSIS — Z8673 Personal history of transient ischemic attack (TIA), and cerebral infarction without residual deficits: Secondary | ICD-10-CM | POA: Diagnosis not present

## 2018-03-18 DIAGNOSIS — I2510 Atherosclerotic heart disease of native coronary artery without angina pectoris: Secondary | ICD-10-CM | POA: Insufficient documentation

## 2018-03-18 DIAGNOSIS — Z8249 Family history of ischemic heart disease and other diseases of the circulatory system: Secondary | ICD-10-CM | POA: Diagnosis not present

## 2018-03-18 DIAGNOSIS — I110 Hypertensive heart disease with heart failure: Secondary | ICD-10-CM | POA: Diagnosis not present

## 2018-03-18 DIAGNOSIS — R262 Difficulty in walking, not elsewhere classified: Secondary | ICD-10-CM | POA: Insufficient documentation

## 2018-03-18 DIAGNOSIS — F17210 Nicotine dependence, cigarettes, uncomplicated: Secondary | ICD-10-CM | POA: Insufficient documentation

## 2018-03-18 DIAGNOSIS — R0902 Hypoxemia: Secondary | ICD-10-CM | POA: Diagnosis not present

## 2018-03-18 DIAGNOSIS — Z87891 Personal history of nicotine dependence: Secondary | ICD-10-CM | POA: Diagnosis not present

## 2018-03-18 DIAGNOSIS — Z886 Allergy status to analgesic agent status: Secondary | ICD-10-CM | POA: Insufficient documentation

## 2018-03-18 DIAGNOSIS — I959 Hypotension, unspecified: Secondary | ICD-10-CM | POA: Diagnosis not present

## 2018-03-18 DIAGNOSIS — R531 Weakness: Secondary | ICD-10-CM | POA: Diagnosis not present

## 2018-03-18 DIAGNOSIS — Z791 Long term (current) use of non-steroidal anti-inflammatories (NSAID): Secondary | ICD-10-CM | POA: Insufficient documentation

## 2018-03-18 DIAGNOSIS — R569 Unspecified convulsions: Secondary | ICD-10-CM | POA: Insufficient documentation

## 2018-03-18 DIAGNOSIS — E119 Type 2 diabetes mellitus without complications: Secondary | ICD-10-CM | POA: Diagnosis not present

## 2018-03-18 DIAGNOSIS — E860 Dehydration: Principal | ICD-10-CM

## 2018-03-18 DIAGNOSIS — R42 Dizziness and giddiness: Secondary | ICD-10-CM | POA: Diagnosis not present

## 2018-03-18 DIAGNOSIS — Z66 Do not resuscitate: Secondary | ICD-10-CM | POA: Diagnosis not present

## 2018-03-18 DIAGNOSIS — Z7902 Long term (current) use of antithrombotics/antiplatelets: Secondary | ICD-10-CM | POA: Insufficient documentation

## 2018-03-18 DIAGNOSIS — J449 Chronic obstructive pulmonary disease, unspecified: Secondary | ICD-10-CM | POA: Insufficient documentation

## 2018-03-18 DIAGNOSIS — Z9889 Other specified postprocedural states: Secondary | ICD-10-CM | POA: Diagnosis not present

## 2018-03-18 DIAGNOSIS — G459 Transient cerebral ischemic attack, unspecified: Secondary | ICD-10-CM | POA: Diagnosis not present

## 2018-03-18 DIAGNOSIS — M6281 Muscle weakness (generalized): Secondary | ICD-10-CM | POA: Diagnosis not present

## 2018-03-18 DIAGNOSIS — G4089 Other seizures: Secondary | ICD-10-CM | POA: Diagnosis not present

## 2018-03-18 DIAGNOSIS — Z888 Allergy status to other drugs, medicaments and biological substances status: Secondary | ICD-10-CM | POA: Insufficient documentation

## 2018-03-18 DIAGNOSIS — Z885 Allergy status to narcotic agent status: Secondary | ICD-10-CM | POA: Insufficient documentation

## 2018-03-18 DIAGNOSIS — R0602 Shortness of breath: Secondary | ICD-10-CM | POA: Diagnosis not present

## 2018-03-18 DIAGNOSIS — Z7984 Long term (current) use of oral hypoglycemic drugs: Secondary | ICD-10-CM | POA: Insufficient documentation

## 2018-03-18 DIAGNOSIS — I4891 Unspecified atrial fibrillation: Secondary | ICD-10-CM | POA: Diagnosis not present

## 2018-03-18 DIAGNOSIS — K219 Gastro-esophageal reflux disease without esophagitis: Secondary | ICD-10-CM | POA: Diagnosis not present

## 2018-03-18 LAB — BASIC METABOLIC PANEL
Anion gap: 8 (ref 5–15)
BUN: 14 mg/dL (ref 8–23)
CO2: 23 mmol/L (ref 22–32)
Calcium: 7.9 mg/dL — ABNORMAL LOW (ref 8.9–10.3)
Chloride: 106 mmol/L (ref 98–111)
Creatinine, Ser: 0.66 mg/dL (ref 0.44–1.00)
GFR calc Af Amer: >60 mL/min (ref >60)
GFR calc non Af Amer: >60 mL/min (ref >60)
GLUCOSE: 128 mg/dL — AB (ref 70–99)
Potassium: 3.1 mmol/L — ABNORMAL LOW (ref 3.5–5.1)
Sodium: 137 mmol/L (ref 135–145)

## 2018-03-18 LAB — CBC
HCT: 38.1 % (ref 36.0–46.0)
Hemoglobin: 12.2 g/dL (ref 12.0–15.0)
MCH: 28.3 pg (ref 26.0–34.0)
MCHC: 32 g/dL (ref 30.0–36.0)
MCV: 88.4 fL (ref 80.0–100.0)
Platelets: 259 10*3/uL (ref 150–400)
RBC: 4.31 MIL/uL (ref 3.87–5.11)
RDW: 13.6 % (ref 11.5–15.5)
WBC: 8.9 10*3/uL (ref 4.0–10.5)
nRBC: 0 % (ref 0.0–0.2)

## 2018-03-18 LAB — TROPONIN I: Troponin I: <0.03 ng/mL (ref <0.03)

## 2018-03-18 LAB — SURGICAL PATHOLOGY

## 2018-03-18 LAB — LACTIC ACID, PLASMA: Lactic Acid, Venous: 1.6 mmol/L (ref 0.5–1.9)

## 2018-03-18 MED ORDER — SODIUM CHLORIDE 0.9 % IV BOLUS
500.0000 mL | Freq: Once | INTRAVENOUS | Status: AC
  Administered 2018-03-18: 500 mL via INTRAVENOUS

## 2018-03-18 MED ORDER — SODIUM CHLORIDE 0.9 % IV SOLN
Freq: Once | INTRAVENOUS | Status: AC
  Administered 2018-03-18: 21:00:00 via INTRAVENOUS

## 2018-03-18 MED ORDER — SODIUM CHLORIDE 0.9 % IV SOLN: Freq: Once | INTRAVENOUS | Status: DC

## 2018-03-18 MED ORDER — SODIUM CHLORIDE 0.9 % IV BOLUS
1000.0000 mL | Freq: Once | INTRAVENOUS | Status: AC
  Administered 2018-03-18: 1000 mL via INTRAVENOUS

## 2018-03-18 NOTE — H&P (Signed)
[place] [facility] [place] NAME: Samantha Aguirre    MR#:  [id number]  DATE OF BIRTH:  03-Sep-1945  DATE OF ADMISSION:  03/18/2018  PRIMARY CARE PHYSICIAN: Tracie Harrier, MD   REQUESTING/REFERRING PHYSICIAN: Merlyn Lot, MD  CHIEF COMPLAINT:   Chief Complaint  Patient presents with  . Dizziness  . Hypotension    HISTORY OF PRESENT ILLNESS:  Samantha Aguirre  is a 73 y.o. female with a known history of hypertension, hyperlipidemia, type 2 diabetes, atrial fibrillation, CAD, COPD, chronic diastolic congestive heart failure, seizures, stroke, hypothyroidism who presented to the ED with dizziness.  She was recently admitted from 2/29-3/4 with TIA symptoms.  She was started on aspirin and Plavix.  She was also noted to have an acute L5 compression fracture.  She underwent kyphoplasty on 3/3.  She was discharged home.  She states that since being home she has not really taken anything by mouth.  She felt really dizzy today.  She denies any chest pain, shortness of breath, palpitations.  In the ED, blood pressures were in the 90s/40s.  Labs are significant for K3.1.  Chest x-ray was negative.  Hospitalists were called for admission.  PAST MEDICAL HISTORY:   Past Medical History:  Diagnosis Date  . Acid reflux   . Arthritis    Bilateral knees, hands, feet  . Asthma   . Atrial fibrillation ([place])   . Cancer ([place])   . CHF (congestive heart failure) (H[facility])    Diastolic CHF  . COPD (chronic obstructive pulmonary disease) ([place])   . Coronary artery disease   . Diabetes mellitus without complication ([place])   . Frequent headaches   . GI bleed   . HLD (hyperlipidemia)   . Hypertension   . Seizures ([place])   . Skin cancer 2015   Suspected basal cell carcinoma on nose, surgically removed  . Stroke ([place]) 04/2017  . Thyroid disease   . Tremors of nervous system     PAST SURGICAL HISTORY:   Past Surgical History:  Procedure Laterality Date  .  ABDOMINAL HYSTERECTOMY  1976  . APPENDECTOMY  1980  . BACK SURGERY    . CAROTID ENDARTERECTOMY    . CHOLECYSTECTOMY  1980  . COLONOSCOPY N/A 02/20/2017   Procedure: COLONOSCOPY;  Surgeon: Lin Landsman, MD;  Location: [facility] ENDOSCOPY;  Service: Gastroenterology;  Laterality: N/A;  . ESOPHAGOGASTRODUODENOSCOPY N/A 02/20/2017   Procedure: ESOPHAGOGASTRODUODENOSCOPY (EGD);  Surgeon: Lin Landsman, MD;  Location: [facility] ENDOSCOPY;  Service: Gastroenterology;  Laterality: N/A;  . ESOPHAGOGASTRODUODENOSCOPY (EGD) WITH PROPOFOL N/A 07/08/2017   Procedure: ESOPHAGOGASTRODUODENOSCOPY (EGD) WITH PROPOFOL;  Surgeon: [name], MD;  Location: [facility] ENDOSCOPY;  Service: Endoscopy;  Laterality: N/A;  . [place]  . GIVENS CAPSULE STUDY N/A 07/09/2017   Procedure: GIVENS CAPSULE STUDY;  Surgeon: Jonathon Bellows, MD;  Location: [facility] ENDOSCOPY;  Service: Gastroenterology;  Laterality: N/A;  . KNEE SURGERY     left  . KYPHOPLASTY N/A 03/16/2018   Procedure: KYPHOPLASTY, L5;  Surgeon: Hessie Knows, MD;  Location: [facility] ORS;  Service: Orthopedics;  Laterality: N/A;  . ROTATOR CUFF REPAIR     right  . TONSILLECTOMY  1950    SOCIAL HISTORY:   Social History   Tobacco Use  . Smoking status: Current Every Day Smoker    Packs/day: 1.00    Years: 53.00    Pack years: 53.00    Types: Cigarettes  . Smokeless tobacco: Never Used  Substance Use  Topics  . Alcohol use: No    FAMILY HISTORY:   Family History  Problem Relation Age of Onset  . Breast cancer Mother   . Heart disease Mother   . Stroke Mother   . Cancer Mother   . COPD Mother   . Diabetes Mother   . Heart disease Father   . Diabetes Father   . Stroke Father   . Alcohol abuse Sister   . Drug abuse Sister   . Stroke Sister   . Cancer Sister   . Mental illness Sister   . Heart disease Brother   . Arthritis Brother   . Diabetes Brother   . Heart disease Maternal Grandfather   . Heart disease Paternal Grandfather      DRUG ALLERGIES:   Allergies  Allergen Reactions  . Ivp Dye [Iodinated Diagnostic Agents] Itching    Pt injected with IV contrast.  5 min after injection pt complained of itching behind ear and on her abd.  . Methotrexate Rash  . Morphine And Related Other (See Comments)    "stops my breathing" NEAR-RESPIRATORY ARREST  . Mushroom Extract Complex Anaphylaxis    Made patient "deathly sick"  . Atenolol Other (See Comments)    "MD took me off of it bc it was doing something wrong" Made patient HYPOTENSIVE  . Percocet [Oxycodone-Acetaminophen] Nausea And Vomiting and Other (See Comments)    Stomach pains  . Percodan [Oxycodone-Aspirin] Nausea And Vomiting and Other (See Comments)    Stomach pains  . Tramadol Nausea Only  . Verapamil Other (See Comments)    " MD told me this was screwing me up" MAKES PATIENT HYPOTENSIVE  . Oxycodone Hcl     REVIEW OF SYSTEMS:   Review of Systems  Constitutional: Positive for malaise/fatigue. Negative for chills and fever.  HENT: Negative for congestion and sore throat.   Eyes: Negative for blurred vision and double vision.  Respiratory: Negative for cough and shortness of breath.   Cardiovascular: Negative for chest pain and palpitations.  Gastrointestinal: Negative for nausea and vomiting.  Genitourinary: Negative for dysuria and urgency.  Musculoskeletal: Positive for back pain. Negative for neck pain.  Neurological: Positive for dizziness. Negative for headaches.  Psychiatric/Behavioral: Negative for depression. The patient is not nervous/anxious.     MEDICATIONS AT HOME:   Prior to Admission medications   Medication Sig Start Date End Date Taking? Authorizing Provider  albuterol (PROVENTIL HFA;VENTOLIN HFA) 108 (90 Base) MCG/ACT inhaler Inhale 2 puffs into the lungs every 6 (six) hours as needed for wheezing or shortness of breath. 10/08/16   Karamalegos, Devonne Doughty, DO  aspirin (ASPIRIN CHILDRENS) 81 MG chewable tablet Chew 1 tablet  (81 mg total) by mouth daily. 03/17/18   Bettey Costa, MD  atorvastatin (LIPITOR) 80 MG tablet Take 1 tablet (80 mg total) by mouth daily at 6 PM. 09/24/16   Karamalegos, Devonne Doughty, DO  blood glucose meter kit and supplies Dispense based on patient and insurance preference. Use up to four times daily as directed. (FOR ICD-10 E10.9, E11.9). Patient not taking: Reported on 03/13/2018 08/03/17   Niel Hummer A, MD  clopidogrel (PLAVIX) 75 MG tablet Take 1 tablet (75 mg total) by mouth daily. 03/17/18 03/17/19  Bettey Costa, MD  colestipol (COLESTID) 1 g tablet Take 2 g by mouth 2 (two) times daily. 10/13/17   [provider]  diclofenac sodium (VOLTAREN) 1 % GEL Apply 2 g topically 4 (four) times daily. 01/11/16   Daymon Larsen, MD  DULoxetine (CYMBALTA) 60 MG capsule Take 1 capsule (60 mg total) by mouth at bedtime. 09/24/16   Karamalegos, Devonne Doughty, DO  ferrous sulfate 325 (65 FE) MG EC tablet Take 1 tablet (325 mg total) by mouth 2 (two) times daily with a meal. Patient taking differently: Take 325 mg by mouth daily.  02/21/17   Hillary Bow, MD  fluticasone (FLONASE) 50 MCG/ACT nasal spray Place 1 spray into both nostrils daily. 10/08/16   Karamalegos, Devonne Doughty, DO  furosemide (LASIX) 20 MG tablet Take 1 tablet by mouth daily. 11/06/17   [provider]  gabapentin (NEURONTIN) 800 MG tablet Take 1 tablet (800 mg total) by mouth 2 (two) times daily. 09/24/16   Karamalegos, Devonne Doughty, DO  glipiZIDE-metformin (METAGLIP) 5-500 MG tablet Take 1 tablet by mouth 2 (two) times daily before a meal. 04/02/17   Karamalegos, Devonne Doughty, DO  levETIRAcetam (KEPPRA) 500 MG tablet Take 1 tablet (500 mg total) by mouth 2 (two) times daily. 09/19/15   Epifanio Lesches, MD  levothyroxine (SYNTHROID, LEVOTHROID) 112 MCG tablet Take 1 tablet (112 mcg total) by mouth daily before breakfast. 09/24/17   Geradine Girt, DO  lisinopril (PRINIVIL,ZESTRIL) 20 MG tablet Take 20 mg by mouth daily.     [provider]  nitroGLYCERIN (NITROSTAT) 0.4 MG SL tablet Place 1 tablet (0.4 mg total) under the tongue every 5 (five) minutes as needed for chest pain. 02/28/16   Bettey Costa, MD  omeprazole (PRILOSEC) 20 MG capsule Take 20 mg by mouth 2 (two) times daily. 09/02/17   [provider]  sucralfate (CARAFATE) 1 g tablet TAKE (1) TABLET BY MOUTH FOUR TIMES A DAY 03/26/17   Karamalegos, Devonne Doughty, DO  umeclidinium bromide (INCRUSE ELLIPTA) 62.5 MCG/INH AEPB Inhale 1 puff into the lungs daily. 11/13/15   Karamalegos, Devonne Doughty, DO  vitamin B-12 (CYANOCOBALAMIN) 500 MCG tablet Take 1 tablet by mouth daily.  06/29/17   [provider]      VITAL SIGNS:  Blood pressure (!) 96/53, pulse 62, temperature (!) 97.4 F (36.3 C), temperature source Oral, resp. rate (!) 23, height _0  (1.549 m), weight 70 kg, SpO2 95 %.  PHYSICAL EXAMINATION:  Physical Exam  GENERAL:  73 y.o.-year-old patient lying in the bed with no acute distress.  Tired appearing. EYES: Pupils equal, round, reactive to light and accommodation. No scleral icterus. Extraocular muscles intact.  HEENT: Head atraumatic, normocephalic. Oropharynx and nasopharynx clear.  Dry mucous membranes. NECK:  Supple, no jugular venous distention. No thyroid enlargement, no tenderness.  LUNGS: Normal breath sounds bilaterally, no wheezing, rales,rhonchi or crepitation. No use of accessory muscles of respiration.  CARDIOVASCULAR: RRR, S1, S2 normal. No murmurs, rubs, or gallops.  ABDOMEN: Soft, nontender, nondistended. Bowel sounds present. No organomegaly or mass.  EXTREMITIES: No pedal edema, cyanosis, or clubbing.  NEUROLOGIC: Cranial nerves II through XII are intact. + Global weakness. Sensation intact. Gait not checked.  PSYCHIATRIC: The patient is alert and oriented x 3.  SKIN: No obvious rash, lesion, or ulcer.   LABORATORY PANEL:   CBC Recent Labs  Lab 03/18/18 1757  WBC 8.9  HGB 12.2  HCT 38.1  PLT 259    ------------------------------------------------------------------------------------------------------------------  Chemistries  Recent Labs  Lab 03/13/18 0826  03/18/18 1757  NA 136   < > 137  K 2.7*   < > 3.1*  CL 99   < > 106  CO2 25   < > 23  GLUCOSE 132*   < >  128*  BUN 9   < > 14  CREATININE 0.69   < > 0.66  CALCIUM 9.0   < > 7.9*  MG 1.8  --   --   AST 22  --   --   ALT 16  --   --   ALKPHOS 123  --   --   BILITOT 0.6  --   --    < > = values in this interval not displayed.   ------------------------------------------------------------------------------------------------------------------  Cardiac Enzymes Recent Labs  Lab 03/18/18 1757  TROPONINI <0.03   ------------------------------------------------------------------------------------------------------------------  RADIOLOGY:  Dg Chest 2 View  Result Date: 03/18/2018 CLINICAL DATA:  Dizziness, lightheadedness starting today. Back surgery on Saturday. EXAM: CHEST - 2 VIEW COMPARISON:  Chest x-ray dated 10/20/2017. FINDINGS: The heart size and mediastinal contours are within normal limits. Both lungs are clear. No acute appearing osseous abnormality. Chronic compression deformity within the upper lumbar spine. IMPRESSION: No active cardiopulmonary disease. No evidence of pneumonia or pulmonary edema. Electronically Signed   By: Franki Cabot M.D.   On: 03/18/2018 18:57   Dg Hip Unilat With Pelvis 2-3 Views Left  Result Date: 03/17/2018 CLINICAL DATA:  Trauma secondary to a fall this morning. EXAM: DG HIP (WITH OR WITHOUT PELVIS) 2-3V LEFT COMPARISON:  None. FINDINGS: There is no evidence of hip fracture or dislocation. Slight bilateral medial joint space narrowing of the hip joints. Previous L5 kyphoplasty. IMPRESSION: No acute abnormalities. Electronically Signed   By: Lorriane Shire M.D.   On: 03/17/2018 09:17   Dg Hip Unilat With Pelvis 2-3 Views Right  Result Date: 03/17/2018 CLINICAL DATA:  Trauma secondary to  a fall this morning. EXAM: DG HIP (WITH OR WITHOUT PELVIS) 2-3V RIGHT COMPARISON:  None. FINDINGS: There is no evidence of hip fracture or dislocation. There is slight medial joint space narrowing. Kyphoplasty at L5. IMPRESSION: No acute abnormalities. Electronically Signed   By: Lorriane Shire M.D.   On: 03/17/2018 09:18      IMPRESSION AND PLAN:   Hypotension- likely due to volume depletion in the setting of poor p.o. intake.  Continue gentle IV fluids.  Hold Lasix and lisinopril.  Check a.m. cortisol.  Recent kyphoplasty- by Dr. Rudene Christians. Pain control. PT consult.  Recent TIA- continue aspirin and Plavix.  Diastolic congestive heart failure- appears dry.  Hold Lasix.  Give gentle fluids.  Hypokalemia-replete and recheck  COPD-stable.  No signs of acute exacerbation.  Continue home inhalers.  Type 2 diabetes- on glipizide-metformin at home.  SSI while hospitalized.  Hypothyroidism- recent TSH normal. Continue Synthroid.  Hyperlipidemia-continue Lipitor and colestipol  Neck pain-continue Cymbalta  Seizures- no seizure-like activity here.  Continue home [place].  All the records are reviewed and case discussed with ED provider. Management plans discussed with the patient, family and they are in agreement.  CODE STATUS: DNR  TOTAL TIME TAKING CARE OF THIS PATIENT: 45 minutes.    Berna Spare Mayo M.D on 03/18/2018 at 9:11 PM  Between 7am to 6pm - Pager [phone]  After 6pm go to [email or web address] - Proofreader  [facility]  Office  [phone]  CC: Primary care physician; Tracie Harrier, MD   Note: This dictation was prepared with Dragon dictation along with smaller phrase technology. Any transcriptional errors that result from this process are unintentional.

## 2018-03-18 NOTE — ED Notes (Signed)
Patient transported to X-ray 

## 2018-03-18 NOTE — ED Notes (Signed)
ED TO INPATIENT HANDOFF REPORT  ED Nurse Name and Phone #: Annie Main [phone]  S Name/Age/Gender Samantha Aguirre 73 y.o. female Room/Bed: ED02A/ED02A  Code Status   Code Status: Prior  Home/SNF/Other Home Patient oriented to: self, place, time and situation Is this baseline? Yes   Triage Complete: Triage complete  Chief Complaint weakness  Triage Note Pt to ED via EMS from home c/o dizzy, lightheadedness that started today.  Had back surgery Saturday and released yesterday.  Was walking with home health nurse and had near syncopal episode, found to be hypotensive for EMS 80/50 BP, given 250cc bolus NS up to 95/60 BP.  Hx of COPD and CHF.  Pt not c/o new pain, only from back surgery, some SOB when riding with EMS but not now.  Presents A&Ox4, chest rise even and unlabored, skin WNL.  CBG 171 with EMS.   Allergies Allergies  Allergen Reactions  . Ivp Dye [Iodinated Diagnostic Agents] Itching    Pt injected with IV contrast.  5 min after injection pt complained of itching behind ear and on her abd.  . Methotrexate Rash  . Morphine And Related Other (See Comments)    "stops my breathing" NEAR-RESPIRATORY ARREST  . Mushroom Extract Complex Anaphylaxis    Made patient "deathly sick"  . Atenolol Other (See Comments)    "MD took me off of it bc it was doing something wrong" Made patient HYPOTENSIVE  . Percocet [Oxycodone-Acetaminophen] Nausea And Vomiting and Other (See Comments)    Stomach pains  . Percodan [Oxycodone-Aspirin] Nausea And Vomiting and Other (See Comments)    Stomach pains  . Tramadol Nausea Only  . Verapamil Other (See Comments)    " MD told me this was screwing me up" MAKES PATIENT HYPOTENSIVE  . Oxycodone Hcl     Level of Care/Admitting Diagnosis ED Disposition    ED Disposition Condition [facility] Area: [place] [100120]  Level of Care: Med-Surg [16]  Diagnosis: Hypotension [509326]  Admitting Physician: Hyman Bible DODD [[id number]]  Attending Physician: Hyman Bible DODD [[id number]]  PT Class (Do Not Modify): Observation [104]  PT Acc Code (Do Not Modify): Observation [10022]       B Medical/Surgery History Past Medical History:  Diagnosis Date  . Acid reflux   . Arthritis    Bilateral knees, hands, feet  . Asthma   . Atrial fibrillation ([place])   . Cancer ([place])   . CHF (congestive heart failure) (HCC)    Diastolic CHF  . COPD (chronic obstructive pulmonary disease) ([place])   . Coronary artery disease   . Diabetes mellitus without complication ([place])   . Frequent headaches   . GI bleed   . HLD (hyperlipidemia)   . Hypertension   . Seizures ([place])   . Skin cancer 2015   Suspected basal cell carcinoma on nose, surgically removed  . Stroke ([place]) 04/2017  . Thyroid disease   . Tremors of nervous system    Past Surgical History:  Procedure Laterality Date  . ABDOMINAL HYSTERECTOMY  1976  . APPENDECTOMY  1980  . BACK SURGERY    . CAROTID ENDARTERECTOMY    . CHOLECYSTECTOMY  1980  . COLONOSCOPY N/A 02/20/2017   Procedure: COLONOSCOPY;  Surgeon: Lin Landsman, MD;  Location: [facility] ENDOSCOPY;  Service: Gastroenterology;  Laterality: N/A;  . ESOPHAGOGASTRODUODENOSCOPY N/A 02/20/2017   Procedure: ESOPHAGOGASTRODUODENOSCOPY (EGD);  Surgeon: Lin Landsman, MD;  Location: [facility] ENDOSCOPY;  Service: Gastroenterology;  Laterality: N/A;  .  ESOPHAGOGASTRODUODENOSCOPY (EGD) WITH PROPOFOL N/A 07/08/2017   Procedure: ESOPHAGOGASTRODUODENOSCOPY (EGD) WITH PROPOFOL;  Surgeon: [name], MD;  Location: [facility] ENDOSCOPY;  Service: Endoscopy;  Laterality: N/A;  . [place]  . GIVENS CAPSULE STUDY N/A 07/09/2017   Procedure: GIVENS CAPSULE STUDY;  Surgeon: Jonathon Bellows, MD;  Location: [facility] ENDOSCOPY;  Service: Gastroenterology;  Laterality: N/A;  . KNEE SURGERY     left  . KYPHOPLASTY N/A 03/16/2018   Procedure: KYPHOPLASTY, L5;  Surgeon: Hessie Knows, MD;  Location: [facility] ORS;  Service:  Orthopedics;  Laterality: N/A;  . ROTATOR CUFF REPAIR     right  . TONSILLECTOMY  1950     A IV Location/Drains/Wounds Patient Lines/Drains/Airways Status   Active Line/Drains/Airways    Name:   Placement date:   Placement time:   Site:   Days:   Peripheral IV 03/18/18 Left Antecubital   03/18/18    1756    Antecubital   less than 1   Incision (Closed) 03/16/18 Back   03/16/18    1846     2          Intake/Output Last 24 hours  Intake/Output Summary (Last 24 hours) at 03/18/2018 2138 Last data filed at 03/18/2018 2121 Gross per 24 hour  Intake 500 ml  Output -  Net 500 ml    Labs/Imaging Results for orders placed or performed during the hospital encounter of 03/18/18 (from the past 48 hour(s))  Basic metabolic panel     Status: Abnormal   Collection Time: 03/18/18  5:57 PM  Result Value Ref Range   Sodium 137 135 - 145 mmol/L   Potassium 3.1 (L) 3.5 - 5.1 mmol/L   Chloride 106 98 - 111 mmol/L   CO2 23 22 - 32 mmol/L   Glucose, Bld 128 (H) 70 - 99 mg/dL   BUN 14 8 - 23 mg/dL   Creatinine, Ser 0.66 0.44 - 1.00 mg/dL   Calcium 7.9 (L) 8.9 - 10.3 mg/dL   GFR calc non Af Amer >60 >60 mL/min   GFR calc Af Amer >60 >60 mL/min   Anion gap 8 5 - 15    Comment: Performed at [facility], [address] [place], [place] [address]  CBC     Status: None   Collection Time: 03/18/18  5:57 PM  Result Value Ref Range   WBC 8.9 4.0 - 10.5 K/uL   RBC 4.31 3.87 - 5.11 MIL/uL   Hemoglobin 12.2 12.0 - 15.0 g/dL   HCT 38.1 36.0 - 46.0 %   MCV 88.4 80.0 - 100.0 fL   MCH 28.3 26.0 - 34.0 pg   MCHC 32.0 30.0 - 36.0 g/dL   RDW 13.6 11.5 - 15.5 %   Platelets 259 150 - 400 K/uL   nRBC 0.0 0.0 - 0.2 %    Comment: Performed at [facility], [address] [place], [place] [address]  Troponin I - Add-On to previous collection     Status: None   Collection Time: 03/18/18  5:57 PM  Result Value Ref Range   Troponin I <0.03 <0.03 ng/mL    Comment: Performed at [facility], [address] [place], [place] [address]  Lactic acid, plasma     Status: None   Collection Time: 03/18/18  7:14 PM  Result Value Ref Range   Lactic Acid, Venous 1.6 0.5 - 1.9 mmol/L    Comment: Performed at [facility], [address] [place], [place]  [address]   Dg Chest 2 View  Result Date: 03/18/2018 CLINICAL DATA:  Dizziness, lightheadedness starting today. Back surgery on Saturday. EXAM: CHEST - 2 VIEW COMPARISON:  Chest x-ray dated 10/20/2017. FINDINGS: The heart size and mediastinal contours are within normal limits. Both lungs are clear. No acute appearing osseous abnormality. Chronic compression deformity within the upper lumbar spine. IMPRESSION: No active cardiopulmonary disease. No evidence of pneumonia or pulmonary edema. Electronically Signed   By: Franki Cabot M.D.   On: 03/18/2018 18:57   Dg Hip Unilat With Pelvis 2-3 Views Left  Result Date: 03/17/2018 CLINICAL DATA:  Trauma secondary to a fall this morning. EXAM: DG HIP (WITH OR WITHOUT PELVIS) 2-3V LEFT COMPARISON:  None. FINDINGS: There is no evidence of hip fracture or dislocation. Slight bilateral medial joint space narrowing of the hip joints. Previous L5 kyphoplasty. IMPRESSION: No acute abnormalities. Electronically Signed   By: Lorriane Shire M.D.   On: 03/17/2018 09:17   Dg Hip Unilat With Pelvis 2-3 Views Right  Result Date: 03/17/2018 CLINICAL DATA:  Trauma secondary to a fall this morning. EXAM: DG HIP (WITH OR WITHOUT PELVIS) 2-3V RIGHT COMPARISON:  None. FINDINGS: There is no evidence of hip fracture or dislocation. There is slight medial joint space narrowing. Kyphoplasty at L5. IMPRESSION: No acute abnormalities. Electronically Signed   By: Lorriane Shire M.D.   On: 03/17/2018 09:18    Pending Labs Unresulted Labs (From admission, onward)    Start     Ordered   03/18/18 1754  Urinalysis, Complete w Microscopic  ONCE - STAT,   STAT     03/18/18 1754   Signed and Held  Basic  metabolic panel  Tomorrow morning,   R     Signed and Held   Signed and Held  CBC  Tomorrow morning,   R     Signed and Held   Signed and Held  Cortisol  Once,   R     Signed and Held          Vitals/Pain Today's Vitals   03/18/18 2030 03/18/18 2100 03/18/18 2124 03/18/18 2130  BP: (!) 92/51 (!) 94/48  (!) 97/57  Pulse:   66 67  Resp: (!) 21 (!) 22 (!) 22 (!) 21  Temp:      TempSrc:      SpO2:   96% 96%  Weight:      Height:      PainSc:        Isolation Precautions No active isolations  Medications Medications  sodium chloride 0.9 % bolus 500 mL (0 mLs Intravenous Stopped 03/18/18 2121)  0.9 %  sodium chloride infusion ( Intravenous New Bag/Given 03/18/18 2125)    Mobility walks with device Low fall risk   Focused Assessments Cardiac Assessment Handoff:  Cardiac Rhythm: Normal sinus rhythm Lab Results  Component Value Date   TROPONINI <0.03 03/18/2018   No results found for: DDIMER Does the Patient currently have chest pain? No     R Recommendations: See Admitting Provider Note  Report given to:   Additional Notes: Pt had recent back surgery, has been A&Ox4, was walking with assistance by home health nurse.

## 2018-03-18 NOTE — ED Notes (Signed)
Stephen RN, aware of bed assigned  

## 2018-03-18 NOTE — ED Provider Notes (Signed)
[facility] Emergency Department Provider Note    First MD Initiated Contact with Patient 03/18/18 1756     (approximate)  I have reviewed the triage vital signs and the nursing notes.   HISTORY  Chief Complaint Dizziness and Hypotension    HPI Samantha Aguirre is a 73 y.o. female presents the ER for evaluation of low blood pressure.  Patient does feel weak.  States he is had decreased oral intake.  She was recently here at this hospital for spinal surgery.  Denies any fevers.  No cough or shortness of breath.  Does have a history of congestive heart failure.  States that she is still been taking her blood pressure medications.    Past Medical History:  Diagnosis Date  . Acid reflux   . Arthritis    Bilateral knees, hands, feet  . Asthma   . Atrial fibrillation ([place])   . Cancer ([place])   . CHF (congestive heart failure) (HCC)    Diastolic CHF  . COPD (chronic obstructive pulmonary disease) ([place])   . Coronary artery disease   . Diabetes mellitus without complication ([place])   . Frequent headaches   . GI bleed   . HLD (hyperlipidemia)   . Hypertension   . Seizures ([place])   . Skin cancer 2015   Suspected basal cell carcinoma on nose, surgically removed  . Stroke ([place]) 04/2017  . Thyroid disease   . Tremors of nervous system    Family History  Problem Relation Age of Onset  . Breast cancer Mother   . Heart disease Mother   . Stroke Mother   . Cancer Mother   . COPD Mother   . Diabetes Mother   . Heart disease Father   . Diabetes Father   . Stroke Father   . Alcohol abuse Sister   . Drug abuse Sister   . Stroke Sister   . Cancer Sister   . Mental illness Sister   . Heart disease Brother   . Arthritis Brother   . Diabetes Brother   . Heart disease Maternal Grandfather   . Heart disease Paternal Grandfather    Past Surgical History:  Procedure Laterality Date  . ABDOMINAL HYSTERECTOMY  1976  . APPENDECTOMY  1980  . BACK SURGERY    .  CAROTID ENDARTERECTOMY    . CHOLECYSTECTOMY  1980  . COLONOSCOPY N/A 02/20/2017   Procedure: COLONOSCOPY;  Surgeon: Lin Landsman, MD;  Location: [facility] ENDOSCOPY;  Service: Gastroenterology;  Laterality: N/A;  . ESOPHAGOGASTRODUODENOSCOPY N/A 02/20/2017   Procedure: ESOPHAGOGASTRODUODENOSCOPY (EGD);  Surgeon: Lin Landsman, MD;  Location: [facility] ENDOSCOPY;  Service: Gastroenterology;  Laterality: N/A;  . ESOPHAGOGASTRODUODENOSCOPY (EGD) WITH PROPOFOL N/A 07/08/2017   Procedure: ESOPHAGOGASTRODUODENOSCOPY (EGD) WITH PROPOFOL;  Surgeon: [name], MD;  Location: [facility] ENDOSCOPY;  Service: Endoscopy;  Laterality: N/A;  . [place]  . GIVENS CAPSULE STUDY N/A 07/09/2017   Procedure: GIVENS CAPSULE STUDY;  Surgeon: Jonathon Bellows, MD;  Location: [facility] ENDOSCOPY;  Service: Gastroenterology;  Laterality: N/A;  . KNEE SURGERY     left  . KYPHOPLASTY N/A 03/16/2018   Procedure: KYPHOPLASTY, L5;  Surgeon: Hessie Knows, MD;  Location: [facility] ORS;  Service: Orthopedics;  Laterality: N/A;  . ROTATOR CUFF REPAIR     right  . TONSILLECTOMY  1950   Patient Active Problem List   Diagnosis Date Noted  . Malnutrition of moderate degree 03/16/2018  . Closed fracture of lumbar vertebral body ([place]) 03/15/2018  .  Speaking difficulty 03/13/2018  . TIA (transient ischemic attack) 09/22/2017  . PAF (paroxysmal atrial fibrillation) ([place])   . Acute CVA (cerebrovascular accident) ([place]) 07/30/2017  . GI bleed 07/08/2017  . Anemia 07/08/2017  . Angiodysplasia of stomach and duodenum with hemorrhage   . CVA (cerebral vascular accident) ([place]) 07/06/2017  . Stroke (cerebrum) ([place])   . Goals of care, counseling/discussion   . Palliative care encounter   . COPD exacerbation ([place]) 04/03/2017  . Flu 03/27/2017  . Iron deficiency anemia due to chronic blood loss   . Migraine 02/13/2017  . Cataracts, bilateral 01/15/2017  . Chronic venous insufficiency 10/06/2016  . Recurrent major depressive disorder,  in partial remission ([place]) 10/06/2016  . Left-sided weakness 10/03/2016  . Chest pain 02/27/2016  . History of stroke 02/19/2016  . Thumb pain, left 01/22/2016  . Traumatic closed nondisplaced fracture of base of metacarpal bone of left thumb 01/22/2016  . Colon cancer screening 11/27/2015  . Depression 11/14/2015  . Coronary artery disease 11/13/2015  . Hypertension 11/13/2015  . History of skin cancer 11/13/2015  . Osteoporosis 11/13/2015  . Chronic low back pain 11/13/2015  . Left medial knee pain 11/13/2015  . Osteoarthritis of multiple joints 11/13/2015  . Other specified hypothyroidism 08/16/2015  . Controlled type 2 diabetes mellitus with diabetic neuropathy ([place]) 08/16/2015  . COPD (chronic obstructive pulmonary disease) ([place]) 08/16/2015  . Tobacco abuse 08/16/2015  . HLD (hyperlipidemia) 08/16/2015  . History of CHF (congestive heart failure) 08/16/2015  . Seizure disorder [facility])       Prior to Admission medications   Medication Sig Start Date End Date Taking? Authorizing Provider  albuterol (PROVENTIL HFA;VENTOLIN HFA) 108 (90 Base) MCG/ACT inhaler Inhale 2 puffs into the lungs every 6 (six) hours as needed for wheezing or shortness of breath. 10/08/16   Karamalegos, Devonne Doughty, DO  aspirin (ASPIRIN CHILDRENS) 81 MG chewable tablet Chew 1 tablet (81 mg total) by mouth daily. 03/17/18   Bettey Costa, MD  atorvastatin (LIPITOR) 80 MG tablet Take 1 tablet (80 mg total) by mouth daily at 6 PM. 09/24/16   Karamalegos, Devonne Doughty, DO  blood glucose meter kit and supplies Dispense based on patient and insurance preference. Use up to four times daily as directed. (FOR ICD-10 E10.9, E11.9). Patient not taking: Reported on 03/13/2018 08/03/17   Niel Hummer A, MD  clopidogrel (PLAVIX) 75 MG tablet Take 1 tablet (75 mg total) by mouth daily. 03/17/18 03/17/19  Bettey Costa, MD  colestipol (COLESTID) 1 g tablet Take 2 g by mouth 2 (two) times daily. 10/13/17   [provider]    diclofenac sodium (VOLTAREN) 1 % GEL Apply 2 g topically 4 (four) times daily. 01/11/16   Daymon Larsen, MD  DULoxetine (CYMBALTA) 60 MG capsule Take 1 capsule (60 mg total) by mouth at bedtime. 09/24/16   Karamalegos, Devonne Doughty, DO  ferrous sulfate 325 (65 FE) MG EC tablet Take 1 tablet (325 mg total) by mouth 2 (two) times daily with a meal. Patient taking differently: Take 325 mg by mouth daily.  02/21/17   Hillary Bow, MD  fluticasone (FLONASE) 50 MCG/ACT nasal spray Place 1 spray into both nostrils daily. 10/08/16   Karamalegos, Devonne Doughty, DO  furosemide (LASIX) 20 MG tablet Take 1 tablet by mouth daily. 11/06/17   [provider]  gabapentin (NEURONTIN) 800 MG tablet Take 1 tablet (800 mg total) by mouth 2 (two) times daily. 09/24/16   Karamalegos, Devonne Doughty, DO  glipiZIDE-metformin (METAGLIP) 5-500  MG tablet Take 1 tablet by mouth 2 (two) times daily before a meal. 04/02/17   Karamalegos, Devonne Doughty, DO  levETIRAcetam (KEPPRA) 500 MG tablet Take 1 tablet (500 mg total) by mouth 2 (two) times daily. 09/19/15   Epifanio Lesches, MD  levothyroxine (SYNTHROID, LEVOTHROID) 112 MCG tablet Take 1 tablet (112 mcg total) by mouth daily before breakfast. 09/24/17   Geradine Girt, DO  lisinopril (PRINIVIL,ZESTRIL) 20 MG tablet Take 20 mg by mouth daily.    [provider]  nitroGLYCERIN (NITROSTAT) 0.4 MG SL tablet Place 1 tablet (0.4 mg total) under the tongue every 5 (five) minutes as needed for chest pain. 02/28/16   Bettey Costa, MD  omeprazole (PRILOSEC) 20 MG capsule Take 20 mg by mouth 2 (two) times daily. 09/02/17   [provider]  sucralfate (CARAFATE) 1 g tablet TAKE (1) TABLET BY MOUTH FOUR TIMES A DAY 03/26/17   Karamalegos, Devonne Doughty, DO  umeclidinium bromide (INCRUSE ELLIPTA) 62.5 MCG/INH AEPB Inhale 1 puff into the lungs daily. 11/13/15   Karamalegos, Devonne Doughty, DO  vitamin B-12 (CYANOCOBALAMIN) 500 MCG tablet Take 1 tablet by mouth daily.  06/29/17    [provider]    Allergies Ivp dye [iodinated diagnostic agents]; Methotrexate; Morphine and related; Mushroom extract complex; Atenolol; Percocet [oxycodone-acetaminophen]; Percodan [oxycodone-aspirin]; Tramadol; Verapamil; and Oxycodone hcl    Social History Social History   Tobacco Use  . Smoking status: Current Every Day Smoker    Packs/day: 1.00    Years: 53.00    Pack years: 53.00    Types: Cigarettes  . Smokeless tobacco: Never Used  Substance Use Topics  . Alcohol use: No  . Drug use: No    Review of Systems Patient denies headaches, rhinorrhea, blurry vision, numbness, shortness of breath, chest pain, edema, cough, abdominal pain, nausea, vomiting, diarrhea, dysuria, fevers, rashes or hallucinations unless otherwise stated above in HPI. ____________________________________________   PHYSICAL EXAM:  VITAL SIGNS: Vitals:   03/18/18 1800 03/18/18 1830  BP: (!) 95/49 (!) 96/53  Pulse: 74 62  Resp: 19 (!) 23  Temp:    SpO2: 99% 95%    Constitutional: Alert, chronically ill appearing Eyes: Conjunctivae are normal.  Head: Atraumatic. Nose: No congestion/rhinnorhea. Mouth/Throat: Mucous membranes are moist.   Neck: No stridor. Painless ROM.  Cardiovascular: Normal rate, regular rhythm. Grossly normal heart sounds.  Good peripheral circulation. Respiratory: Normal respiratory effort.  No retractions. Lungs CTAB. Gastrointestinal: Soft and nontender. No distention. No abdominal bruits. No CVA tenderness. Genitourinary:  Musculoskeletal: No lower extremity tenderness nor edema.  No joint effusions. Neurologic:  Normal speech and language. No gross focal neurologic deficits are appreciated. No facial droop Skin:  Skin is warm, dry and intact. No rash noted. Psychiatric: Mood and affect are normal. Speech and behavior are normal.  ____________________________________________   LABS (all labs ordered are listed, but only abnormal results are  displayed)  Results for orders placed or performed during the hospital encounter of 03/18/18 (from the past 24 hour(s))  Basic metabolic panel     Status: Abnormal   Collection Time: 03/18/18  5:57 PM  Result Value Ref Range   Sodium 137 135 - 145 mmol/L   Potassium 3.1 (L) 3.5 - 5.1 mmol/L   Chloride 106 98 - 111 mmol/L   CO2 23 22 - 32 mmol/L   Glucose, Bld 128 (H) 70 - 99 mg/dL   BUN 14 8 - 23 mg/dL   Creatinine, Ser 0.66 0.44 - 1.00 mg/dL  Calcium 7.9 (L) 8.9 - 10.3 mg/dL   GFR calc non Af Amer >60 >60 mL/min   GFR calc Af Amer >60 >60 mL/min   Anion gap 8 5 - 15  CBC     Status: None   Collection Time: 03/18/18  5:57 PM  Result Value Ref Range   WBC 8.9 4.0 - 10.5 K/uL   RBC 4.31 3.87 - 5.11 MIL/uL   Hemoglobin 12.2 12.0 - 15.0 g/dL   HCT 38.1 36.0 - 46.0 %   MCV 88.4 80.0 - 100.0 fL   MCH 28.3 26.0 - 34.0 pg   MCHC 32.0 30.0 - 36.0 g/dL   RDW 13.6 11.5 - 15.5 %   Platelets 259 150 - 400 K/uL   nRBC 0.0 0.0 - 0.2 %  Troponin I - Add-On to previous collection     Status: None   Collection Time: 03/18/18  5:57 PM  Result Value Ref Range   Troponin I <0.03 <0.03 ng/mL  Lactic acid, plasma     Status: None   Collection Time: 03/18/18  7:14 PM  Result Value Ref Range   Lactic Acid, Venous 1.6 0.5 - 1.9 mmol/L   ____________________________________________  EKG My review and personal interpretation at Time: 17:52   Indication: hypotension  Rate: 60  Rhythm: sinus Axis: normal Other: normal intervals, nonsepcific st abn ____________________________________________  RADIOLOGY  I personally reviewed all radiographic images ordered to evaluate for the above acute complaints and reviewed radiology reports and findings.  These findings were personally discussed with the patient.  Please see medical record for radiology report.  ____________________________________________   PROCEDURES  Procedure(s) performed:  .Critical Care Performed by: Merlyn Lot,  MD Authorized by: Merlyn Lot, MD   Critical care provider statement:    Critical care time (minutes):  30   Critical care time was exclusive of:  Separately billable procedures and treating other patients   Critical care was necessary to treat or prevent imminent or life-threatening deterioration of the following conditions:  Dehydration   Critical care was time spent personally by me on the following activities:  Development of treatment plan with patient or surrogate, discussions with consultants, evaluation of patient's response to treatment, examination of patient, obtaining history from patient or surrogate, ordering and performing treatments and interventions, ordering and review of laboratory studies, ordering and review of radiographic studies, pulse oximetry, re-evaluation of patient's condition and review of old charts      Critical Care performed: yes ____________________________________________   INITIAL IMPRESSION / [place] / ED COURSE  Pertinent labs & imaging results that were available during my care of the patient were reviewed by me and considered in my medical decision making (see chart for details).   DDX: Dehydration, anemia, sepsis, UTI, orthostasis, dysrhythmia, ACS  Bridget Flora is a 73 y.o. who presents to the ED with symptoms as described above.  I do suspect dehydration likely exacerbated by her congestive heart failure medications.  She is also endorsing decreased oral intake in the postoperative state.  Exam is reassuring.  Does not show any signs of acute decompensated heart failure.  No edema.  She is not hypoxic.  Will give IV fluids and reassess.  The patient will be placed on continuous pulse oximetry and telemetry for monitoring.  Laboratory evaluation will be sent to evaluate for the above complaints.     Clinical Course as of Mar 17 2108  Thu Mar 18, 2018  2037 No lactic acidosis.  No signs  of infectious process.  Does not seem  consistent with neurogenic shock.  I do suspect overmedication.   [PR]    Clinical Course User Index [PR] Merlyn Lot, MD     As part of my medical decision making, I reviewed the following data within the [place] notes reviewed and incorporated, Labs reviewed, notes from prior ED visits and [place] Controlled Substance Database   ____________________________________________   FINAL CLINICAL IMPRESSION(S) / ED DIAGNOSES  Final diagnoses:  Dehydration  Hypotension, unspecified hypotension type      NEW MEDICATIONS STARTED DURING THIS VISIT:  New Prescriptions   No medications on file     Note:  This document was prepared using Dragon voice recognition software and may include unintentional dictation errors.    Merlyn Lot, MD 03/18/18 2110

## 2018-03-18 NOTE — ED Triage Notes (Signed)
Pt to ED via EMS from home c/o dizzy, lightheadedness that started today.  Had back surgery Saturday and released yesterday.  Was walking with home health nurse and had near syncopal episode, found to be hypotensive for EMS 80/50 BP, given 250cc bolus NS up to 95/60 BP.  Hx of COPD and CHF.  Pt not c/o new pain, only from back surgery, some SOB when riding with EMS but not now.  Presents A&Ox4, chest rise even and unlabored, skin WNL.  CBG 171 with EMS.

## 2018-03-18 NOTE — Progress Notes (Signed)
Family Meeting Note  Advance Directive:yes  Today a meeting took place with the Patient.  Patient is able to participate.   The following clinical team members were present during this meeting:MD  The following were discussed:Patient's diagnosis: hypotension, Patient's progosis: Unable to determine and Goals for treatment: DNR  Additional follow-up to be provided: prn  Time spent during discussion:20 minutes  Evette Doffing, MD

## 2018-03-19 DIAGNOSIS — G459 Transient cerebral ischemic attack, unspecified: Secondary | ICD-10-CM | POA: Diagnosis not present

## 2018-03-19 DIAGNOSIS — Z9889 Other specified postprocedural states: Secondary | ICD-10-CM | POA: Diagnosis not present

## 2018-03-19 DIAGNOSIS — I5032 Chronic diastolic (congestive) heart failure: Secondary | ICD-10-CM | POA: Diagnosis not present

## 2018-03-19 DIAGNOSIS — E860 Dehydration: Secondary | ICD-10-CM | POA: Diagnosis not present

## 2018-03-19 DIAGNOSIS — I959 Hypotension, unspecified: Secondary | ICD-10-CM | POA: Diagnosis not present

## 2018-03-19 LAB — CBC
HCT: 37.5 % (ref 36.0–46.0)
Hemoglobin: 11.9 g/dL — ABNORMAL LOW (ref 12.0–15.0)
MCH: 28.4 pg (ref 26.0–34.0)
MCHC: 31.7 g/dL (ref 30.0–36.0)
MCV: 89.5 fL (ref 80.0–100.0)
NRBC: 0 % (ref 0.0–0.2)
Platelets: 232 10*3/uL (ref 150–400)
RBC: 4.19 MIL/uL (ref 3.87–5.11)
RDW: 13.8 % (ref 11.5–15.5)
WBC: 7.1 10*3/uL (ref 4.0–10.5)

## 2018-03-19 LAB — GLUCOSE, CAPILLARY
GLUCOSE-CAPILLARY: 122 mg/dL — AB (ref 70–99)
Glucose-Capillary: 102 mg/dL — ABNORMAL HIGH (ref 70–99)
Glucose-Capillary: 128 mg/dL — ABNORMAL HIGH (ref 70–99)

## 2018-03-19 LAB — BASIC METABOLIC PANEL
Anion gap: 6 (ref 5–15)
BUN: 11 mg/dL (ref 8–23)
CO2: 26 mmol/L (ref 22–32)
Calcium: 8.5 mg/dL — ABNORMAL LOW (ref 8.9–10.3)
Chloride: 109 mmol/L (ref 98–111)
Creatinine, Ser: 0.57 mg/dL (ref 0.44–1.00)
Glucose, Bld: 111 mg/dL — ABNORMAL HIGH (ref 70–99)
Potassium: 4.2 mmol/L (ref 3.5–5.1)
SODIUM: 141 mmol/L (ref 135–145)

## 2018-03-19 LAB — CORTISOL: Cortisol, Plasma: 7.5 ug/dL

## 2018-03-19 MED ORDER — GABAPENTIN 400 MG PO CAPS
800.0000 mg | ORAL_CAPSULE | Freq: Two times a day (BID) | ORAL | Status: DC
  Administered 2018-03-19: 09:00:00 800 mg via ORAL
  Filled 2018-03-19 (×2): qty 2
  Filled 2018-03-19: qty 8

## 2018-03-19 MED ORDER — LEVOTHYROXINE SODIUM 112 MCG PO TABS
112.0000 ug | ORAL_TABLET | Freq: Every day | ORAL | Status: DC
  Administered 2018-03-19: 09:00:00 112 ug via ORAL
  Filled 2018-03-19: qty 1

## 2018-03-19 MED ORDER — LISINOPRIL 10 MG PO TABS: 20.0000 mg | ORAL_TABLET | Freq: Every day | ORAL | 0 refills | Status: DC

## 2018-03-19 MED ORDER — INSULIN ASPART 100 UNIT/ML ~~LOC~~ SOLN: 0.0000 [IU] | Freq: Every day | SUBCUTANEOUS | Status: DC

## 2018-03-19 MED ORDER — FLUTICASONE PROPIONATE 50 MCG/ACT NA SUSP
1.0000 | Freq: Every day | NASAL | Status: DC
  Filled 2018-03-19: qty 16

## 2018-03-19 MED ORDER — ASPIRIN 81 MG PO CHEW
81.0000 mg | CHEWABLE_TABLET | Freq: Every day | ORAL | Status: DC
  Administered 2018-03-19: 09:00:00 81 mg via ORAL
  Filled 2018-03-19: qty 1

## 2018-03-19 MED ORDER — VITAMIN B-12 1000 MCG PO TABS: 500.0000 ug | ORAL_TABLET | Freq: Every day | ORAL | Status: DC

## 2018-03-19 MED ORDER — CLOPIDOGREL BISULFATE 75 MG PO TABS
75.0000 mg | ORAL_TABLET | Freq: Every day | ORAL | Status: DC
  Administered 2018-03-19: 75 mg via ORAL
  Filled 2018-03-19: qty 1

## 2018-03-19 MED ORDER — DULOXETINE HCL 30 MG PO CPEP
60.0000 mg | ORAL_CAPSULE | Freq: Every day | ORAL | Status: DC
  Administered 2018-03-19: 60 mg via ORAL
  Filled 2018-03-19: qty 2

## 2018-03-19 MED ORDER — POTASSIUM CHLORIDE CRYS ER 20 MEQ PO TBCR
40.0000 meq | EXTENDED_RELEASE_TABLET | Freq: Once | ORAL | Status: AC
  Administered 2018-03-19: 40 meq via ORAL
  Filled 2018-03-19: qty 2

## 2018-03-19 MED ORDER — FERROUS SULFATE 325 (65 FE) MG PO TABS
325.0000 mg | ORAL_TABLET | Freq: Every day | ORAL | Status: DC
  Administered 2018-03-19: 325 mg via ORAL
  Filled 2018-03-19: qty 1

## 2018-03-19 MED ORDER — ONDANSETRON HCL 4 MG PO TABS: 4.0000 mg | ORAL_TABLET | Freq: Four times a day (QID) | ORAL | Status: DC | PRN

## 2018-03-19 MED ORDER — ALBUTEROL SULFATE (2.5 MG/3ML) 0.083% IN NEBU: 3.0000 mL | INHALATION_SOLUTION | Freq: Four times a day (QID) | RESPIRATORY_TRACT | Status: DC | PRN

## 2018-03-19 MED ORDER — LEVETIRACETAM 500 MG PO TABS
500.0000 mg | ORAL_TABLET | Freq: Two times a day (BID) | ORAL | Status: DC
  Administered 2018-03-19 (×2): 500 mg via ORAL
  Filled 2018-03-19 (×3): qty 1

## 2018-03-19 MED ORDER — INSULIN ASPART 100 UNIT/ML ~~LOC~~ SOLN
0.0000 [IU] | Freq: Three times a day (TID) | SUBCUTANEOUS | Status: DC
  Administered 2018-03-19: 13:00:00 1 [IU] via SUBCUTANEOUS
  Filled 2018-03-19: qty 1

## 2018-03-19 MED ORDER — UMECLIDINIUM BROMIDE 62.5 MCG/INH IN AEPB
1.0000 | INHALATION_SPRAY | Freq: Every day | RESPIRATORY_TRACT | Status: DC
  Administered 2018-03-19: 1 via RESPIRATORY_TRACT
  Filled 2018-03-19: qty 7

## 2018-03-19 MED ORDER — MORPHINE SULFATE (PF) 2 MG/ML IV SOLN: 2.0000 mg | INTRAVENOUS | Status: DC | PRN

## 2018-03-19 MED ORDER — ATORVASTATIN CALCIUM 20 MG PO TABS: 80.0000 mg | ORAL_TABLET | Freq: Every day | ORAL | Status: DC

## 2018-03-19 MED ORDER — ACETAMINOPHEN 650 MG RE SUPP: 650.0000 mg | Freq: Four times a day (QID) | RECTAL | Status: DC | PRN

## 2018-03-19 MED ORDER — POLYETHYLENE GLYCOL 3350 17 G PO PACK: 17.0000 g | PACK | Freq: Every day | ORAL | Status: DC | PRN

## 2018-03-19 MED ORDER — COLESTIPOL HCL 1 G PO TABS
2.0000 g | ORAL_TABLET | Freq: Two times a day (BID) | ORAL | Status: DC
  Administered 2018-03-19: 2 g via ORAL
  Filled 2018-03-19 (×2): qty 2

## 2018-03-19 MED ORDER — ONDANSETRON HCL 4 MG/2ML IJ SOLN: 4.0000 mg | Freq: Four times a day (QID) | INTRAMUSCULAR | Status: DC | PRN

## 2018-03-19 MED ORDER — ENOXAPARIN SODIUM 40 MG/0.4ML ~~LOC~~ SOLN: 40.0000 mg | SUBCUTANEOUS | Status: DC

## 2018-03-19 MED ORDER — ACETAMINOPHEN 325 MG PO TABS: 650.0000 mg | ORAL_TABLET | Freq: Four times a day (QID) | ORAL | Status: DC | PRN

## 2018-03-19 NOTE — Evaluation (Signed)
Physical Therapy Evaluation Patient Details Name: Samantha Aguirre MRN: [id number] DOB: 1945-09-09 Today's Date: 03/19/2018   History of Present Illness  Patient is 73 yo female presented to ED via EMS with hypotension. Pt underwent kyphoplasty 03/16/2018, discharged home. PMH includes hypokalemia, epilepsy, DM, afib, cancer, CHF, COPD, HLD, HTN, stroke  Clinical Impression  Patient up in chair, reported low back pain is 7/10 due to recent surgery, behavior and cognition WFLs. The patient reported that she lives alone in one story home with friends/family that can check on her, previously ambulated with RW, and uses wheelchair for community distances.   The patient was able to perform sit <> stand x2 this session with supervision and RW. Ambulated ~79ft in room with RW without complaints of lightheadedness/dizziness. BP assessed in standing at 0 mins and 2 mins, WFLs (128/99). Pt with complaints of low back pain throughout session but safe with use of RW. Overall the patient demonstrated deficits (see "PT Problem List") that impede the patient's functional abilities, safety, and mobility and would benefit from skilled PT intervention. Recommendation is HHPT.      Follow Up Recommendations Home health PT    Equipment Recommendations  None recommended by PT    Recommendations for Other Services       Precautions / Restrictions Precautions Precautions: Back Precaution Booklet Issued: No Precaution Comments: Seizure precautions Spinal Brace Comments: no brace in room Restrictions Weight Bearing Restrictions: No      Mobility  Bed Mobility               General bed mobility comments: deferred pt up in chair  Transfers Overall transfer level: Needs assistance Equipment used: Rolling walker (2 wheeled) Transfers: Sit to/from Stand Sit to Stand: Supervision         General transfer comment: performed x2 this session, supervision/CGA  Ambulation/Gait Ambulation/Gait  assistance: Min guard Gait Distance (Feet): 30 Feet Assistive device: Rolling walker (2 wheeled) Gait Pattern/deviations: Step-through pattern;Decreased step length - right;Decreased step length - left     General Gait Details: Patient with decreased gait, but steady, safe overall  Stairs            Wheelchair Mobility    Modified Rankin (Stroke Patients Only)       Balance Overall balance assessment: Needs assistance Sitting-balance support: Feet supported Sitting balance-Leahy Scale: Good       Standing balance-Leahy Scale: Fair                               Pertinent Vitals/Pain Pain Assessment: 0-10 Pain Score: 7  Pain Location: Low back  Pain Descriptors / Indicators: Sore;Grimacing;Guarding Pain Intervention(s): Limited activity within patient's tolerance;Monitored during session;Repositioned    Home Living Family/patient expects to be discharged to:: Private residence Living Arrangements: Alone Available Help at Discharge: Family;Friend(s);Available PRN/intermittently Type of Home: Apartment Home Access: Level entry     Home Layout: One level Home Equipment: Walker - 2 wheels;Shower seat;Adaptive equipment;Cane - single point;Grab bars - tub/shower;Wheelchair - power Additional Comments: pt reports getting a power wheelchair soon    Prior Function Level of Independence: Independent with assistive device(s)         Comments: Pt mod I for ambulation of HH distances; uses a RW. uses wheelchair for community ambulation     Hand Dominance   Dominant Hand: Right    Extremity/Trunk Assessment        Lower Extremity Assessment Lower Extremity Assessment: Generalized  weakness;RLE deficits/detail;LLE deficits/detail RLE Deficits / Details: grossly 4+/5 LLE Deficits / Details: grossly 4/5       Communication   Communication: No difficulties  Cognition Arousal/Alertness: Awake/alert Behavior During Therapy: WFL for tasks  assessed/performed Overall Cognitive Status: Within Functional Limits for tasks assessed                                        General Comments      Exercises     Assessment/Plan    PT Assessment Patient needs continued PT services  PT Problem List Decreased strength;Decreased activity tolerance;Decreased mobility;Decreased knowledge of use of DME;Decreased knowledge of precautions;Pain;Decreased balance       PT Treatment Interventions DME instruction;Gait training;Functional mobility training;Therapeutic activities;Therapeutic exercise;Balance training;Patient/family education    PT Goals (Current goals can be found in the Care Plan section)  Acute Rehab PT Goals Patient Stated Goal: to go home, to go home safely PT Goal Formulation: With patient Time For Goal Achievement: 04/02/18 Potential to Achieve Goals: Good    Frequency Min 2X/week   Barriers to discharge        Co-evaluation               AM-PAC PT "6 Clicks" Mobility  Outcome Measure Help needed turning from your back to your side while in a flat bed without using bedrails?: A Little Help needed moving from lying on your back to sitting on the side of a flat bed without using bedrails?: A Little Help needed moving to and from a bed to a chair (including a wheelchair)?: A Little Help needed standing up from a chair using your arms (e.g., wheelchair or bedside chair)?: A Little Help needed to walk in hospital room?: A Little Help needed climbing 3-5 steps with a railing? : A Little 6 Click Score: 18    End of Session Equipment Utilized During Treatment: Gait belt Activity Tolerance: Patient tolerated treatment well Patient left: in chair;with call bell/phone within reach Nurse Communication: Mobility status PT Visit Diagnosis: Muscle weakness (generalized) (M62.81);History of falling (Z91.81);Difficulty in walking, not elsewhere classified (R26.2);Pain Pain - part of body: (lumbar  spine)    Time: 1884-1660 PT Time Calculation (min) (ACUTE ONLY): 26 min   Charges:   PT Evaluation $PT Eval Low Complexity: 1 Low PT Treatments $Therapeutic Activity: 8-22 mins       Lieutenant Diego PT, DPT 10:12 AM,03/19/18 [phone]

## 2018-03-19 NOTE — Discharge Summary (Signed)
[facility] [place] NAME: Samantha Aguirre    MR#:  [id number]  DATE OF BIRTH:  10/13/1945  DATE OF ADMISSION:  03/18/2018 ADMITTING PHYSICIAN: Sela Hua, MD  DATE OF DISCHARGE: 03/19/2018   PRIMARY CARE PHYSICIAN: Tracie Harrier, MD    ADMISSION DIAGNOSIS:  Dehydration [E86.0] Hypotension, unspecified hypotension type [I95.9]  DISCHARGE DIAGNOSIS:  Active Problems:   Hypotension   SECONDARY DIAGNOSIS:   Past Medical History:  Diagnosis Date  . Acid reflux   . Arthritis    Bilateral knees, hands, feet  . Asthma   . Atrial fibrillation ([place])   . Cancer ([place])   . CHF (congestive heart failure) (H[facility])    Diastolic CHF  . COPD (chronic obstructive pulmonary disease) ([place])   . Coronary artery disease   . Diabetes mellitus without complication ([place])   . Frequent headaches   . GI bleed   . HLD (hyperlipidemia)   . Hypertension   . Seizures ([place])   . Skin cancer 2015   Suspected basal cell carcinoma on nose, surgically removed  . Stroke ([place]) 04/2017  . Thyroid disease   . Tremors of nervous system     HOSPITAL COURSE:  73 year old female with history of chronic diastolic heart failure with recent hospitalization for TIA and kyphoplasty who presented to the emergency room with dizziness and found to have hypotension.  1.  Dizziness with hypotension: This is due to volume depletion Blood pressure has improved I decreased lisinopril dose to 10 mg daily and will hold diuretic for now. She will need follow-up early next week for her blood pressure.  2.  Recent TIA: Continue aspirin and Plavix  3.  Recent kyphoplasty: Patient's pain is controlled  4.  Chronic diastolic heart failure: Patient is now euvolemic Lasix has been discontinued for now but will need to be restarted by her PCP.  5.  Hypokalemia: This was repleted  6.  COPD without signs of exacerbation  7.  Diabetes: Patient will continue on  glipizide/metformin at home and continue ADA diet.  8.  Hypothyroidism: Continue Synthroid   DISCHARGE CONDITIONS AND DIET:   Stable for discharge diabetic heart healthy diet  CONSULTS OBTAINED:    DRUG ALLERGIES:   Allergies  Allergen Reactions  . Ivp Dye [Iodinated Diagnostic Agents] Itching    Pt injected with IV contrast.  5 min after injection pt complained of itching behind ear and on her abd.  . Methotrexate Rash  . Morphine And Related Other (See Comments)    "stops my breathing" NEAR-RESPIRATORY ARREST  . Mushroom Extract Complex Anaphylaxis    Made patient "deathly sick"  . Atenolol Other (See Comments)    "MD took me off of it bc it was doing something wrong" Made patient HYPOTENSIVE  . Percocet [Oxycodone-Acetaminophen] Nausea And Vomiting and Other (See Comments)    Stomach pains  . Percodan [Oxycodone-Aspirin] Nausea And Vomiting and Other (See Comments)    Stomach pains  . Tramadol Nausea Only  . Verapamil Other (See Comments)    " MD told me this was screwing me up" MAKES PATIENT HYPOTENSIVE  . Oxycodone Hcl     DISCHARGE MEDICATIONS:   Allergies as of 03/19/2018      Reactions   Ivp Dye [iodinated Diagnostic Agents] Itching   Pt injected with IV contrast.  5 min after injection pt complained of itching behind ear and on her abd.   Methotrexate Rash   Morphine And Related Other (  See Comments)   "stops my breathing" NEAR-RESPIRATORY ARREST   Mushroom Extract Complex Anaphylaxis   Made patient "deathly sick"   Atenolol Other (See Comments)   "MD took me off of it bc it was doing something wrong" Made patient HYPOTENSIVE   Percocet [oxycodone-acetaminophen] Nausea And Vomiting, Other (See Comments)   Stomach pains   Percodan [oxycodone-aspirin] Nausea And Vomiting, Other (See Comments)   Stomach pains   Tramadol Nausea Only   Verapamil Other (See Comments)   " MD told me this was screwing me up" MAKES PATIENT HYPOTENSIVE   Oxycodone Hcl        Medication List    STOP taking these medications   furosemide 20 MG tablet Commonly known as:  LASIX     TAKE these medications   albuterol 108 (90 Base) MCG/ACT inhaler Commonly known as:  PROVENTIL HFA;VENTOLIN HFA Inhale 2 puffs into the lungs every 6 (six) hours as needed for wheezing or shortness of breath.   aspirin 81 MG chewable tablet Commonly known as:  Aspirin Childrens Chew 1 tablet (81 mg total) by mouth daily.   atorvastatin 80 MG tablet Commonly known as:  LIPITOR Take 1 tablet (80 mg total) by mouth daily at 6 PM.   clopidogrel 75 MG tablet Commonly known as:  Plavix Take 1 tablet (75 mg total) by mouth daily.   colestipol 1 g tablet Commonly known as:  COLESTID Take 2 g by mouth 2 (two) times daily.   diclofenac sodium 1 % Gel Commonly known as:  VOLTAREN Apply 2 g topically 4 (four) times daily.   DULoxetine 60 MG capsule Commonly known as:  CYMBALTA Take 1 capsule (60 mg total) by mouth at bedtime.   ferrous sulfate 325 (65 FE) MG EC tablet Take 1 tablet (325 mg total) by mouth 2 (two) times daily with a meal. What changed:  when to take this   fluticasone 50 MCG/ACT nasal spray Commonly known as:  FLONASE Place 1 spray into both nostrils daily.   gabapentin 800 MG tablet Commonly known as:  NEURONTIN Take 1 tablet (800 mg total) by mouth 2 (two) times daily.   glipiZIDE-metformin 5-500 MG tablet Commonly known as:  METAGLIP Take 1 tablet by mouth 2 (two) times daily before a meal. What changed:    how much to take  additional instructions   levETIRAcetam 500 MG tablet Commonly known as:  KEPPRA Take 1 tablet (500 mg total) by mouth 2 (two) times daily.   levothyroxine 112 MCG tablet Commonly known as:  SYNTHROID, LEVOTHROID Take 1 tablet (112 mcg total) by mouth daily before breakfast.   lisinopril 10 MG tablet Commonly known as:  PRINIVIL,ZESTRIL Take 2 tablets (20 mg total) by mouth daily. What changed:  medication strength    nitroGLYCERIN 0.4 MG SL tablet Commonly known as:  NITROSTAT Place 1 tablet (0.4 mg total) under the tongue every 5 (five) minutes as needed for chest pain.   omeprazole 20 MG capsule Commonly known as:  PRILOSEC Take 20 mg by mouth 2 (two) times daily.   sucralfate 1 g tablet Commonly known as:  CARAFATE TAKE (1) TABLET BY MOUTH FOUR TIMES A DAY What changed:  See the new instructions.   umeclidinium bromide 62.5 MCG/INH Aepb Commonly known as:  Incruse Ellipta Inhale 1 puff into the lungs daily.   vitamin B-12 500 MCG tablet Commonly known as:  CYANOCOBALAMIN Take 1 tablet by mouth daily.         Today  CHIEF COMPLAINT:  No acute issues overnight.  Patient reports no dizziness   VITAL SIGNS:  Blood pressure (!) 128/99, pulse (!) 108, temperature 97.9 F (36.6 C), temperature source Oral, resp. rate 20, height 5\' 1"  (1.549 m), weight 72.7 kg, SpO2 100 %.   REVIEW OF SYSTEMS:  Review of Systems  Constitutional: Negative.  Negative for chills, fever and malaise/fatigue.  HENT: Negative.  Negative for ear discharge, ear pain, hearing loss, nosebleeds and sore throat.   Eyes: Negative.  Negative for blurred vision and pain.  Respiratory: Negative.  Negative for cough, hemoptysis, shortness of breath and wheezing.   Cardiovascular: Negative.  Negative for chest pain, palpitations and leg swelling.  Gastrointestinal: Negative.  Negative for abdominal pain, blood in stool, diarrhea, nausea and vomiting.  Genitourinary: Negative.  Negative for dysuria.  Musculoskeletal: Negative.  Negative for back pain.  Skin: Negative.   Neurological: Negative for dizziness, tremors, speech change, focal weakness, seizures and headaches.  Endo/Heme/Allergies: Negative.  Does not bruise/bleed easily.  Psychiatric/Behavioral: Negative.  Negative for depression, hallucinations and suicidal ideas.     PHYSICAL EXAMINATION:  GENERAL:  73 y.o.-year-old patient lying in the bed with no  acute distress.  NECK:  Supple, no jugular venous distention. No thyroid enlargement, no tenderness.  LUNGS: Normal breath sounds bilaterally, no wheezing, rales,rhonchi  No use of accessory muscles of respiration.  CARDIOVASCULAR: S1, S2 normal. No murmurs, rubs, or gallops.  ABDOMEN: Soft, non-tender, non-distended. Bowel sounds present. No organomegaly or mass.  EXTREMITIES: No pedal edema, cyanosis, or clubbing.  PSYCHIATRIC: The patient is alert and oriented x 3.  SKIN: No obvious rash, lesion, or ulcer.   DATA REVIEW:   CBC Recent Labs  Lab 03/19/18 0417  WBC 7.1  HGB 11.9*  HCT 37.5  PLT 232    Chemistries  Recent Labs  Lab 03/13/18 0826  03/19/18 0417  NA 136   < > 141  K 2.7*   < > 4.2  CL 99   < > 109  CO2 25   < > 26  GLUCOSE 132*   < > 111*  BUN 9   < > 11  CREATININE 0.69   < > 0.57  CALCIUM 9.0   < > 8.5*  MG 1.8  --   --   AST 22  --   --   ALT 16  --   --   ALKPHOS 123  --   --   BILITOT 0.6  --   --    < > = values in this interval not displayed.    Cardiac Enzymes Recent Labs  Lab 03/18/18 1757  TROPONINI <0.03    Microbiology Results  @MICRORSLT48 @  RADIOLOGY:  Dg Chest 2 View  Result Date: 03/18/2018 CLINICAL DATA:  Dizziness, lightheadedness starting today. Back surgery on Saturday. EXAM: CHEST - 2 VIEW COMPARISON:  Chest x-ray dated 10/20/2017. FINDINGS: The heart size and mediastinal contours are within normal limits. Both lungs are clear. No acute appearing osseous abnormality. Chronic compression deformity within the upper lumbar spine. IMPRESSION: No active cardiopulmonary disease. No evidence of pneumonia or pulmonary edema. Electronically Signed   By: Franki Cabot M.D.   On: 03/18/2018 18:57      Allergies as of 03/19/2018      Reactions   Ivp Dye [iodinated Diagnostic Agents] Itching   Pt injected with IV contrast.  5 min after injection pt complained of itching behind ear and on her abd.   Methotrexate Rash  Morphine And  Related Other (See Comments)   "stops my breathing" NEAR-RESPIRATORY ARREST   Mushroom Extract Complex Anaphylaxis   Made patient "deathly sick"   Atenolol Other (See Comments)   "MD took me off of it bc it was doing something wrong" Made patient HYPOTENSIVE   Percocet [oxycodone-acetaminophen] Nausea And Vomiting, Other (See Comments)   Stomach pains   Percodan [oxycodone-aspirin] Nausea And Vomiting, Other (See Comments)   Stomach pains   Tramadol Nausea Only   Verapamil Other (See Comments)   " MD told me this was screwing me up" MAKES PATIENT HYPOTENSIVE   Oxycodone Hcl       Medication List    STOP taking these medications   furosemide 20 MG tablet Commonly known as:  LASIX     TAKE these medications   albuterol 108 (90 Base) MCG/ACT inhaler Commonly known as:  PROVENTIL HFA;VENTOLIN HFA Inhale 2 puffs into the lungs every 6 (six) hours as needed for wheezing or shortness of breath.   aspirin 81 MG chewable tablet Commonly known as:  Aspirin Childrens Chew 1 tablet (81 mg total) by mouth daily.   atorvastatin 80 MG tablet Commonly known as:  LIPITOR Take 1 tablet (80 mg total) by mouth daily at 6 PM.   clopidogrel 75 MG tablet Commonly known as:  Plavix Take 1 tablet (75 mg total) by mouth daily.   colestipol 1 g tablet Commonly known as:  COLESTID Take 2 g by mouth 2 (two) times daily.   diclofenac sodium 1 % Gel Commonly known as:  VOLTAREN Apply 2 g topically 4 (four) times daily.   DULoxetine 60 MG capsule Commonly known as:  CYMBALTA Take 1 capsule (60 mg total) by mouth at bedtime.   ferrous sulfate 325 (65 FE) MG EC tablet Take 1 tablet (325 mg total) by mouth 2 (two) times daily with a meal. What changed:  when to take this   fluticasone 50 MCG/ACT nasal spray Commonly known as:  FLONASE Place 1 spray into both nostrils daily.   gabapentin 800 MG tablet Commonly known as:  NEURONTIN Take 1 tablet (800 mg total) by mouth 2 (two) times  daily.   glipiZIDE-metformin 5-500 MG tablet Commonly known as:  METAGLIP Take 1 tablet by mouth 2 (two) times daily before a meal. What changed:    how much to take  additional instructions   levETIRAcetam 500 MG tablet Commonly known as:  KEPPRA Take 1 tablet (500 mg total) by mouth 2 (two) times daily.   levothyroxine 112 MCG tablet Commonly known as:  SYNTHROID, LEVOTHROID Take 1 tablet (112 mcg total) by mouth daily before breakfast.   lisinopril 10 MG tablet Commonly known as:  PRINIVIL,ZESTRIL Take 2 tablets (20 mg total) by mouth daily. What changed:  medication strength   nitroGLYCERIN 0.4 MG SL tablet Commonly known as:  NITROSTAT Place 1 tablet (0.4 mg total) under the tongue every 5 (five) minutes as needed for chest pain.   omeprazole 20 MG capsule Commonly known as:  PRILOSEC Take 20 mg by mouth 2 (two) times daily.   sucralfate 1 g tablet Commonly known as:  CARAFATE TAKE (1) TABLET BY MOUTH FOUR TIMES A DAY What changed:  See the new instructions.   umeclidinium bromide 62.5 MCG/INH Aepb Commonly known as:  Incruse Ellipta Inhale 1 puff into the lungs daily.   vitamin B-12 500 MCG tablet Commonly known as:  CYANOCOBALAMIN Take 1 tablet by mouth daily.  Management plans discussed with the patient and she is in agreement. Stable for discharge home with hhc  Patient should follow up with pcp  CODE STATUS:     Code Status Orders  (From admission, onward)         Start     Ordered   03/19/18 0040  Do not attempt resuscitation (DNR)  Continuous    Question Answer Comment  In the event of cardiac or respiratory ARREST Do not call a "code blue"   In the event of cardiac or respiratory ARREST Do not perform Intubation, CPR, defibrillation or ACLS   In the event of cardiac or respiratory ARREST Use medication by any route, position, wound care, and other measures to relive pain and suffering. May use oxygen, suction and manual  treatment of airway obstruction as needed for comfort.   Comments nurse may pronounce      03/19/18 0039        Code Status History    Date Active Date Inactive Code Status Order ID Comments User Context   03/13/2018 2703 03/17/2018 2043 DNR [id number]  Loletha Grayer, MD ED   10/20/2017 1704 10/21/2017 2006 DNR [id number]  Epifanio Lesches, MD ED   09/22/2017 2207 09/24/2017 1732 DNR [id number]  Rise Patience, MD ED   07/30/2017 1420 08/03/2017 1655 DNR [id number]  Karmen Bongo, MD ED   07/06/2017 1313 07/11/2017 2202 DNR [id number]  Fritzi Mandes, MD Inpatient   04/03/2017 2010 04/08/2017 2102 DNR [id number]  Demetrios Loll, MD Inpatient   03/27/2017 1519 04/02/2017 1517 Full Code [id number]  Saundra Shelling, MD Inpatient   02/18/2017 1623 02/21/2017 1919 Full Code [id number]  Salary, Avel Peace, MD Inpatient   10/03/2016 1841 10/04/2016 1739 Full Code [id number]  Demetrios Loll, MD Inpatient   02/27/2016 1744 02/28/2016 2034 Full Code [id number]  Vaughan Basta, MD Inpatient   02/19/2016 1553 02/21/2016 2041 Full Code [id number]  Gladstone Lighter, MD Inpatient   12/27/2015 1309 01/01/2016 1833 DNR [id number]  Loletha Grayer, MD ED   09/16/2015 1852 09/19/2015 1929 Full Code [id number]  Baxter Hire, MD Inpatient   08/16/2015 1714 08/19/2015 1735 DNR [id number]  Willia Craze, NP Inpatient    Advance Directive Documentation     Most Recent Value  Type of Advance Directive  Living will  Pre-existing out of facility DNR order (yellow form or pink MOST form)  Yellow form placed in chart (order not valid for inpatient use)  "MOST" Form in Place?  -      TOTAL TIME TAKING CARE OF THIS PATIENT: 38 minutes.    Note: This dictation was prepared with Dragon dictation along with smaller phrase technology. Any transcriptional errors that result from this process are unintentional.  Earland Reish M.D on 03/19/2018 at 10:39 AM  Between 7am to 6pm - Pager - [phone] After 6pm go to [email or web address] - password [facility]  Office  [phone]  CC: Primary care physician; Tracie Harrier, MD

## 2018-03-19 NOTE — Care Management Note (Signed)
Case Management Note  Patient Details  Name: Samantha Aguirre MRN: [id number] Date of Birth: 1945/11/17  Subjective/Objective:  Patient to be discharged per MD order. Orders in place for home health services. Patient recently set up with home health via [facility]. Patient to resume services. Notified Cassie from [facility]. No DME needs.                   Action/Plan:   Expected Discharge Date:  03/19/18               Expected Discharge Plan:     In-House Referral:     Discharge planning Services  CM Consult  Post Acute Care Choice:  Home Health, Resumption of Svcs/PTA Provider Choice offered to:     DME Arranged:    DME Agency:     HH Arranged:  PT, RN [place] Agency:  [facility]  Status of Service:  Completed, signed off  If discussed at [place] of Stay Meetings, dates discussed:    Additional Comments:  Latanya Maudlin, RN 03/19/2018, 10:11 AM

## 2018-03-22 DIAGNOSIS — J449 Chronic obstructive pulmonary disease, unspecified: Secondary | ICD-10-CM | POA: Diagnosis not present

## 2018-03-22 DIAGNOSIS — I4891 Unspecified atrial fibrillation: Secondary | ICD-10-CM | POA: Diagnosis not present

## 2018-03-22 DIAGNOSIS — I110 Hypertensive heart disease with heart failure: Secondary | ICD-10-CM | POA: Diagnosis not present

## 2018-03-22 DIAGNOSIS — I5032 Chronic diastolic (congestive) heart failure: Secondary | ICD-10-CM | POA: Diagnosis not present

## 2018-03-22 DIAGNOSIS — S32050D Wedge compression fracture of fifth lumbar vertebra, subsequent encounter for fracture with routine healing: Secondary | ICD-10-CM | POA: Diagnosis not present

## 2018-03-22 DIAGNOSIS — G459 Transient cerebral ischemic attack, unspecified: Secondary | ICD-10-CM | POA: Diagnosis not present

## 2018-03-23 ENCOUNTER — Other Ambulatory Visit: Payer: Self-pay | Admitting: *Deleted

## 2018-03-23 DIAGNOSIS — S32050D Wedge compression fracture of fifth lumbar vertebra, subsequent encounter for fracture with routine healing: Secondary | ICD-10-CM | POA: Diagnosis not present

## 2018-03-23 DIAGNOSIS — J449 Chronic obstructive pulmonary disease, unspecified: Secondary | ICD-10-CM | POA: Diagnosis not present

## 2018-03-23 DIAGNOSIS — G459 Transient cerebral ischemic attack, unspecified: Secondary | ICD-10-CM | POA: Diagnosis not present

## 2018-03-23 DIAGNOSIS — I110 Hypertensive heart disease with heart failure: Secondary | ICD-10-CM | POA: Diagnosis not present

## 2018-03-23 DIAGNOSIS — I5032 Chronic diastolic (congestive) heart failure: Secondary | ICD-10-CM | POA: Diagnosis not present

## 2018-03-23 DIAGNOSIS — I4891 Unspecified atrial fibrillation: Secondary | ICD-10-CM | POA: Diagnosis not present

## 2018-03-23 NOTE — Patient Outreach (Signed)
[facility]) Care Management  03/23/2018  Samantha Aguirre Oct 31, 1945 [id number]   Subjective: Telephone call to patient's home  / mobile number, no answer, message states voicemail not activated, and unable to leave a message.    Objective: Per KPN (Knowledge Performance Now, point of care tool) and chart review, patient hospitalized  03/18/2018 - 03/19/2018 for dehydration, hypotension.   Patient also has a history of asthma, CAD, hyperlipidemia, thyroid disease, skin cancer, hypertension, diabetes, and tremors.      Assessment: Received Medicare EMMI General Discharge Red Alert flag follow up on 03/22/2018.  Red alert flag trigger, Day #1, patient answered yes to the following question: Unfilled prescriptions? [facility] EMMI follow up pending patient contact.     Plan:  RNCM will send unsuccessful outreach letter, [facility] pamphlet, handout: Know Before You Go, will call patient for 2nd telephone outreach attempt within 4 business days, [facility] EMMI follow up, and proceed with case closure, within 10 business days if no return call.      Samantha Aguirre H. Annia Friendly, BSN, [place] [facility] Telephonic CM Phone: [phone] Fax: [phone]

## 2018-03-24 ENCOUNTER — Other Ambulatory Visit: Payer: Self-pay | Admitting: *Deleted

## 2018-03-24 DIAGNOSIS — I4891 Unspecified atrial fibrillation: Secondary | ICD-10-CM | POA: Diagnosis not present

## 2018-03-24 DIAGNOSIS — G459 Transient cerebral ischemic attack, unspecified: Secondary | ICD-10-CM | POA: Diagnosis not present

## 2018-03-24 DIAGNOSIS — I5032 Chronic diastolic (congestive) heart failure: Secondary | ICD-10-CM | POA: Diagnosis not present

## 2018-03-24 DIAGNOSIS — S32050D Wedge compression fracture of fifth lumbar vertebra, subsequent encounter for fracture with routine healing: Secondary | ICD-10-CM | POA: Diagnosis not present

## 2018-03-24 DIAGNOSIS — J449 Chronic obstructive pulmonary disease, unspecified: Secondary | ICD-10-CM | POA: Diagnosis not present

## 2018-03-24 DIAGNOSIS — I110 Hypertensive heart disease with heart failure: Secondary | ICD-10-CM | POA: Diagnosis not present

## 2018-03-24 NOTE — Patient Outreach (Signed)
[facility]) Care Management  03/24/2018  Samantha Aguirre 12-10-1945 [id number]    Subjective: Telephone call to patient's home / mobile number, spoke with patient, and HIPAA verified.  Discussed [facility] Care Management Medicare EMMI General Discharge Red Alert flag follow up, patient voiced understanding, and is in agreement to follow up.  Patient states she remembers receiving EMMI automated call, medication refill issue has been resolved, she has 3 day supply of all medications, home pharmacy [facility]), will deliver medications to her home on 03/25/2018, and pick up the new prescription.  Patient states she is doing well.   Patient states she does not have any education material, EMMI follow up, care coordination, care management, disease monitoring, transportation, community resource, or pharmacy needs at this time.   States she is very appreciative of the follow up.   Objective: Per KPN (Knowledge Performance Now, point of care tool) and chart review, patient hospitalized  03/18/2018 - 03/19/2018 for dehydration, hypotension.   Patient also has a history of asthma, CAD, hyperlipidemia, thyroid disease, skin cancer, hypertension, diabetes, and tremors.      Assessment: Received Medicare EMMI General Discharge Red Alert flag follow up on 03/22/2018.  Red alert flag trigger, Day #1, patient answered yes to the following question: Unfilled prescriptions? EMMI follow up completed and no further care management needs.     Plan:  RNCM will complete case closure due to follow up completed / no care management needs.       Moesha Sarchet H. Annia Friendly, BSN, [facility] Telephonic CM Phone: [phone] Fax: [phone]

## 2018-03-29 DIAGNOSIS — J449 Chronic obstructive pulmonary disease, unspecified: Secondary | ICD-10-CM | POA: Diagnosis not present

## 2018-03-29 DIAGNOSIS — I5032 Chronic diastolic (congestive) heart failure: Secondary | ICD-10-CM | POA: Diagnosis not present

## 2018-03-29 DIAGNOSIS — I4891 Unspecified atrial fibrillation: Secondary | ICD-10-CM | POA: Diagnosis not present

## 2018-03-29 DIAGNOSIS — I110 Hypertensive heart disease with heart failure: Secondary | ICD-10-CM | POA: Diagnosis not present

## 2018-03-29 DIAGNOSIS — G459 Transient cerebral ischemic attack, unspecified: Secondary | ICD-10-CM | POA: Diagnosis not present

## 2018-03-29 DIAGNOSIS — S32050D Wedge compression fracture of fifth lumbar vertebra, subsequent encounter for fracture with routine healing: Secondary | ICD-10-CM | POA: Diagnosis not present

## 2018-03-31 DIAGNOSIS — I4891 Unspecified atrial fibrillation: Secondary | ICD-10-CM | POA: Diagnosis not present

## 2018-03-31 DIAGNOSIS — G459 Transient cerebral ischemic attack, unspecified: Secondary | ICD-10-CM | POA: Diagnosis not present

## 2018-03-31 DIAGNOSIS — I110 Hypertensive heart disease with heart failure: Secondary | ICD-10-CM | POA: Diagnosis not present

## 2018-03-31 DIAGNOSIS — J449 Chronic obstructive pulmonary disease, unspecified: Secondary | ICD-10-CM | POA: Diagnosis not present

## 2018-03-31 DIAGNOSIS — I5032 Chronic diastolic (congestive) heart failure: Secondary | ICD-10-CM | POA: Diagnosis not present

## 2018-03-31 DIAGNOSIS — S32050D Wedge compression fracture of fifth lumbar vertebra, subsequent encounter for fracture with routine healing: Secondary | ICD-10-CM | POA: Diagnosis not present

## 2018-04-01 DIAGNOSIS — G459 Transient cerebral ischemic attack, unspecified: Secondary | ICD-10-CM | POA: Diagnosis not present

## 2018-04-01 DIAGNOSIS — I110 Hypertensive heart disease with heart failure: Secondary | ICD-10-CM | POA: Diagnosis not present

## 2018-04-01 DIAGNOSIS — S32050D Wedge compression fracture of fifth lumbar vertebra, subsequent encounter for fracture with routine healing: Secondary | ICD-10-CM | POA: Diagnosis not present

## 2018-04-01 DIAGNOSIS — I4891 Unspecified atrial fibrillation: Secondary | ICD-10-CM | POA: Diagnosis not present

## 2018-04-01 DIAGNOSIS — I5032 Chronic diastolic (congestive) heart failure: Secondary | ICD-10-CM | POA: Diagnosis not present

## 2018-04-01 DIAGNOSIS — J449 Chronic obstructive pulmonary disease, unspecified: Secondary | ICD-10-CM | POA: Diagnosis not present

## 2018-04-06 DIAGNOSIS — I4891 Unspecified atrial fibrillation: Secondary | ICD-10-CM | POA: Diagnosis not present

## 2018-04-06 DIAGNOSIS — I5032 Chronic diastolic (congestive) heart failure: Secondary | ICD-10-CM | POA: Diagnosis not present

## 2018-04-06 DIAGNOSIS — I110 Hypertensive heart disease with heart failure: Secondary | ICD-10-CM | POA: Diagnosis not present

## 2018-04-06 DIAGNOSIS — S32050D Wedge compression fracture of fifth lumbar vertebra, subsequent encounter for fracture with routine healing: Secondary | ICD-10-CM | POA: Diagnosis not present

## 2018-04-06 DIAGNOSIS — J449 Chronic obstructive pulmonary disease, unspecified: Secondary | ICD-10-CM | POA: Diagnosis not present

## 2018-04-06 DIAGNOSIS — G459 Transient cerebral ischemic attack, unspecified: Secondary | ICD-10-CM | POA: Diagnosis not present

## 2018-04-08 DIAGNOSIS — I110 Hypertensive heart disease with heart failure: Secondary | ICD-10-CM | POA: Diagnosis not present

## 2018-04-08 DIAGNOSIS — I5032 Chronic diastolic (congestive) heart failure: Secondary | ICD-10-CM | POA: Diagnosis not present

## 2018-04-08 DIAGNOSIS — G459 Transient cerebral ischemic attack, unspecified: Secondary | ICD-10-CM | POA: Diagnosis not present

## 2018-04-08 DIAGNOSIS — J449 Chronic obstructive pulmonary disease, unspecified: Secondary | ICD-10-CM | POA: Diagnosis not present

## 2018-04-08 DIAGNOSIS — I4891 Unspecified atrial fibrillation: Secondary | ICD-10-CM | POA: Diagnosis not present

## 2018-04-08 DIAGNOSIS — S32050D Wedge compression fracture of fifth lumbar vertebra, subsequent encounter for fracture with routine healing: Secondary | ICD-10-CM | POA: Diagnosis not present

## 2018-04-12 ENCOUNTER — Other Ambulatory Visit: Payer: Self-pay | Admitting: Internal Medicine

## 2018-04-12 DIAGNOSIS — S300XXA Contusion of lower back and pelvis, initial encounter: Secondary | ICD-10-CM | POA: Diagnosis not present

## 2018-04-12 DIAGNOSIS — Z8673 Personal history of transient ischemic attack (TIA), and cerebral infarction without residual deficits: Secondary | ICD-10-CM | POA: Diagnosis not present

## 2018-04-12 DIAGNOSIS — R51 Headache: Secondary | ICD-10-CM | POA: Diagnosis not present

## 2018-04-12 DIAGNOSIS — W010XXD Fall on same level from slipping, tripping and stumbling without subsequent striking against object, subsequent encounter: Secondary | ICD-10-CM | POA: Diagnosis not present

## 2018-04-12 DIAGNOSIS — Z1231 Encounter for screening mammogram for malignant neoplasm of breast: Secondary | ICD-10-CM

## 2018-04-12 DIAGNOSIS — I69359 Hemiplegia and hemiparesis following cerebral infarction affecting unspecified side: Secondary | ICD-10-CM | POA: Diagnosis not present

## 2018-04-12 DIAGNOSIS — E1140 Type 2 diabetes mellitus with diabetic neuropathy, unspecified: Secondary | ICD-10-CM | POA: Diagnosis not present

## 2018-04-12 DIAGNOSIS — I482 Chronic atrial fibrillation, unspecified: Secondary | ICD-10-CM | POA: Diagnosis not present

## 2018-04-12 DIAGNOSIS — Z9889 Other specified postprocedural states: Secondary | ICD-10-CM | POA: Diagnosis not present

## 2018-04-12 DIAGNOSIS — Z720 Tobacco use: Secondary | ICD-10-CM | POA: Diagnosis not present

## 2018-04-12 DIAGNOSIS — G40909 Epilepsy, unspecified, not intractable, without status epilepticus: Secondary | ICD-10-CM | POA: Diagnosis not present

## 2018-04-12 DIAGNOSIS — J449 Chronic obstructive pulmonary disease, unspecified: Secondary | ICD-10-CM | POA: Diagnosis not present

## 2018-04-12 DIAGNOSIS — Z1239 Encounter for other screening for malignant neoplasm of breast: Secondary | ICD-10-CM

## 2018-04-12 DIAGNOSIS — E785 Hyperlipidemia, unspecified: Secondary | ICD-10-CM | POA: Diagnosis not present

## 2018-04-15 DIAGNOSIS — I4891 Unspecified atrial fibrillation: Secondary | ICD-10-CM | POA: Diagnosis not present

## 2018-04-15 DIAGNOSIS — S32050D Wedge compression fracture of fifth lumbar vertebra, subsequent encounter for fracture with routine healing: Secondary | ICD-10-CM | POA: Diagnosis not present

## 2018-04-15 DIAGNOSIS — G459 Transient cerebral ischemic attack, unspecified: Secondary | ICD-10-CM | POA: Diagnosis not present

## 2018-04-15 DIAGNOSIS — J449 Chronic obstructive pulmonary disease, unspecified: Secondary | ICD-10-CM | POA: Diagnosis not present

## 2018-04-15 DIAGNOSIS — I110 Hypertensive heart disease with heart failure: Secondary | ICD-10-CM | POA: Diagnosis not present

## 2018-04-15 DIAGNOSIS — I5032 Chronic diastolic (congestive) heart failure: Secondary | ICD-10-CM | POA: Diagnosis not present

## 2018-04-17 DIAGNOSIS — S32050D Wedge compression fracture of fifth lumbar vertebra, subsequent encounter for fracture with routine healing: Secondary | ICD-10-CM | POA: Diagnosis not present

## 2018-04-17 DIAGNOSIS — R2689 Other abnormalities of gait and mobility: Secondary | ICD-10-CM | POA: Diagnosis not present

## 2018-04-17 DIAGNOSIS — Z7982 Long term (current) use of aspirin: Secondary | ICD-10-CM | POA: Diagnosis not present

## 2018-04-17 DIAGNOSIS — I4891 Unspecified atrial fibrillation: Secondary | ICD-10-CM | POA: Diagnosis not present

## 2018-04-17 DIAGNOSIS — I5032 Chronic diastolic (congestive) heart failure: Secondary | ICD-10-CM | POA: Diagnosis not present

## 2018-04-17 DIAGNOSIS — Z7984 Long term (current) use of oral hypoglycemic drugs: Secondary | ICD-10-CM | POA: Diagnosis not present

## 2018-04-17 DIAGNOSIS — G459 Transient cerebral ischemic attack, unspecified: Secondary | ICD-10-CM | POA: Diagnosis not present

## 2018-04-17 DIAGNOSIS — G4089 Other seizures: Secondary | ICD-10-CM | POA: Diagnosis not present

## 2018-04-17 DIAGNOSIS — Z87891 Personal history of nicotine dependence: Secondary | ICD-10-CM | POA: Diagnosis not present

## 2018-04-17 DIAGNOSIS — E119 Type 2 diabetes mellitus without complications: Secondary | ICD-10-CM | POA: Diagnosis not present

## 2018-04-17 DIAGNOSIS — R531 Weakness: Secondary | ICD-10-CM | POA: Diagnosis not present

## 2018-04-17 DIAGNOSIS — J449 Chronic obstructive pulmonary disease, unspecified: Secondary | ICD-10-CM | POA: Diagnosis not present

## 2018-04-17 DIAGNOSIS — F17210 Nicotine dependence, cigarettes, uncomplicated: Secondary | ICD-10-CM | POA: Diagnosis not present

## 2018-04-17 DIAGNOSIS — I110 Hypertensive heart disease with heart failure: Secondary | ICD-10-CM | POA: Diagnosis not present

## 2018-04-21 ENCOUNTER — Encounter: Payer: Self-pay | Admitting: *Deleted

## 2018-04-21 ENCOUNTER — Other Ambulatory Visit: Payer: Self-pay | Admitting: *Deleted

## 2018-04-21 DIAGNOSIS — S32050D Wedge compression fracture of fifth lumbar vertebra, subsequent encounter for fracture with routine healing: Secondary | ICD-10-CM | POA: Diagnosis not present

## 2018-04-21 DIAGNOSIS — I110 Hypertensive heart disease with heart failure: Secondary | ICD-10-CM | POA: Diagnosis not present

## 2018-04-21 DIAGNOSIS — J449 Chronic obstructive pulmonary disease, unspecified: Secondary | ICD-10-CM | POA: Diagnosis not present

## 2018-04-21 DIAGNOSIS — I4891 Unspecified atrial fibrillation: Secondary | ICD-10-CM | POA: Diagnosis not present

## 2018-04-21 DIAGNOSIS — I5032 Chronic diastolic (congestive) heart failure: Secondary | ICD-10-CM | POA: Diagnosis not present

## 2018-04-21 DIAGNOSIS — G459 Transient cerebral ischemic attack, unspecified: Secondary | ICD-10-CM | POA: Diagnosis not present

## 2018-04-21 NOTE — Patient Outreach (Signed)
[facility]) Care Management  04/21/2018  Samantha Aguirre 14-Mar-1945 [id number]  CSW was able to make initial contact with patient today to perform phone assessment, as well as assess and assist with social work needs and services.  CSW introduced self, explained role and types of services provided through [facility] ([facility]).  CSW further explained to patient that CSW works with patient's Primary Care Physician, also with [facility], Dr. Tracie Harrier. CSW then explained the reason for the call, indicating that Dr. Ginette Pitman thought that patient would benefit from social work services and resources to assist with counseling and supportive services for symptoms of depression.  CSW obtained two HIPAA compliant identifiers from patient, which included patient's name and date of birth.  Patient denied experiencing symptoms of depression at this time, indicating that she "went through a rough patch, but is doing fine now".  CSW offered to provide counseling and supportive services to patient, until CSW is able to get patient established with a counselor in the community, but patient declined.  Patient also declined having CSW mail her EMMI information pertaining specifically to "Signs and Symptoms of Depression".  Patient denies feeling homicidal or suicidal and assured CSW that she takes her psychotropic medications exactly as prescribed.    Patient reported that she recently terminated her personal care services through [facility], indicating that she is able to perform all activities of daily living independently.  Patient further reported that she is able to manage her own medications (courtesy of bubble packs through [facility]), prepare her own meals, perform grocery shopping duties, clean her home and do her own laundry, etc.  Patient is currently receiving home health services through [facility], which consists of a nurse and  physical therapist.  Patient denied the need for a home health aide.    Patient uses a scooter to assist with ambulation and uses public transportation [facility]) to get to and from all her physician appointments.  Patient did not wish to receive nursing services, of any kind, through [employer], indicating that she believes that she is managing her symptoms and diseases well.  Patient remembered having worked with [name], [facility] with [facility], back in February of 2020, appreciative of all services received.  CSW will perform a case closure on patient, as all goals of treatment have been met from social work standpoint and no additional social work needs have been identified at this time.  CSW will notify patient's Telephonic RNCM with [facility], Sonda Rumble of CSW's plans to close patient's case.  CSW will fax an update to patient's Primary Care Physician, Dr. Tracie Harrier to ensure that they are aware of CSW's involvement with patient's plan of care.  CSW was able to confirm with patient that she has the correct contact information for CSW, encouraging patient to contact CSW directly if she needs additional social work services in the near future.    Nat Christen, BSW, MSW, LCSW  Licensed Education officer, [facility]  Mailing [place] [address] [place],  [address] Physical Address-[address], [place],  [address] Toll Free Main # [phone] Fax # [phone] Cell # [phone]  Office # [phone] [email or web address]

## 2018-04-26 DIAGNOSIS — G459 Transient cerebral ischemic attack, unspecified: Secondary | ICD-10-CM | POA: Diagnosis not present

## 2018-04-26 DIAGNOSIS — I4891 Unspecified atrial fibrillation: Secondary | ICD-10-CM | POA: Diagnosis not present

## 2018-04-26 DIAGNOSIS — S32050D Wedge compression fracture of fifth lumbar vertebra, subsequent encounter for fracture with routine healing: Secondary | ICD-10-CM | POA: Diagnosis not present

## 2018-04-26 DIAGNOSIS — I5032 Chronic diastolic (congestive) heart failure: Secondary | ICD-10-CM | POA: Diagnosis not present

## 2018-04-26 DIAGNOSIS — I110 Hypertensive heart disease with heart failure: Secondary | ICD-10-CM | POA: Diagnosis not present

## 2018-04-26 DIAGNOSIS — J449 Chronic obstructive pulmonary disease, unspecified: Secondary | ICD-10-CM | POA: Diagnosis not present

## 2018-04-28 DIAGNOSIS — I110 Hypertensive heart disease with heart failure: Secondary | ICD-10-CM | POA: Diagnosis not present

## 2018-04-28 DIAGNOSIS — J449 Chronic obstructive pulmonary disease, unspecified: Secondary | ICD-10-CM | POA: Diagnosis not present

## 2018-04-28 DIAGNOSIS — G459 Transient cerebral ischemic attack, unspecified: Secondary | ICD-10-CM | POA: Diagnosis not present

## 2018-04-28 DIAGNOSIS — S32050D Wedge compression fracture of fifth lumbar vertebra, subsequent encounter for fracture with routine healing: Secondary | ICD-10-CM | POA: Diagnosis not present

## 2018-04-28 DIAGNOSIS — I4891 Unspecified atrial fibrillation: Secondary | ICD-10-CM | POA: Diagnosis not present

## 2018-04-28 DIAGNOSIS — I5032 Chronic diastolic (congestive) heart failure: Secondary | ICD-10-CM | POA: Diagnosis not present

## 2018-04-29 DIAGNOSIS — G459 Transient cerebral ischemic attack, unspecified: Secondary | ICD-10-CM | POA: Diagnosis not present

## 2018-04-29 DIAGNOSIS — J449 Chronic obstructive pulmonary disease, unspecified: Secondary | ICD-10-CM | POA: Diagnosis not present

## 2018-04-29 DIAGNOSIS — S32050D Wedge compression fracture of fifth lumbar vertebra, subsequent encounter for fracture with routine healing: Secondary | ICD-10-CM | POA: Diagnosis not present

## 2018-04-29 DIAGNOSIS — I5032 Chronic diastolic (congestive) heart failure: Secondary | ICD-10-CM | POA: Diagnosis not present

## 2018-04-29 DIAGNOSIS — I4891 Unspecified atrial fibrillation: Secondary | ICD-10-CM | POA: Diagnosis not present

## 2018-04-29 DIAGNOSIS — I110 Hypertensive heart disease with heart failure: Secondary | ICD-10-CM | POA: Diagnosis not present

## 2018-05-03 DIAGNOSIS — J449 Chronic obstructive pulmonary disease, unspecified: Secondary | ICD-10-CM | POA: Diagnosis not present

## 2018-05-03 DIAGNOSIS — I110 Hypertensive heart disease with heart failure: Secondary | ICD-10-CM | POA: Diagnosis not present

## 2018-05-03 DIAGNOSIS — I5032 Chronic diastolic (congestive) heart failure: Secondary | ICD-10-CM | POA: Diagnosis not present

## 2018-05-03 DIAGNOSIS — I4891 Unspecified atrial fibrillation: Secondary | ICD-10-CM | POA: Diagnosis not present

## 2018-05-03 DIAGNOSIS — G459 Transient cerebral ischemic attack, unspecified: Secondary | ICD-10-CM | POA: Diagnosis not present

## 2018-05-03 DIAGNOSIS — S32050D Wedge compression fracture of fifth lumbar vertebra, subsequent encounter for fracture with routine healing: Secondary | ICD-10-CM | POA: Diagnosis not present

## 2018-05-05 DIAGNOSIS — I5032 Chronic diastolic (congestive) heart failure: Secondary | ICD-10-CM | POA: Diagnosis not present

## 2018-05-05 DIAGNOSIS — S32050D Wedge compression fracture of fifth lumbar vertebra, subsequent encounter for fracture with routine healing: Secondary | ICD-10-CM | POA: Diagnosis not present

## 2018-05-05 DIAGNOSIS — G459 Transient cerebral ischemic attack, unspecified: Secondary | ICD-10-CM | POA: Diagnosis not present

## 2018-05-05 DIAGNOSIS — I110 Hypertensive heart disease with heart failure: Secondary | ICD-10-CM | POA: Diagnosis not present

## 2018-05-05 DIAGNOSIS — J449 Chronic obstructive pulmonary disease, unspecified: Secondary | ICD-10-CM | POA: Diagnosis not present

## 2018-05-05 DIAGNOSIS — I4891 Unspecified atrial fibrillation: Secondary | ICD-10-CM | POA: Diagnosis not present

## 2018-05-10 DIAGNOSIS — I4891 Unspecified atrial fibrillation: Secondary | ICD-10-CM | POA: Diagnosis not present

## 2018-05-10 DIAGNOSIS — G459 Transient cerebral ischemic attack, unspecified: Secondary | ICD-10-CM | POA: Diagnosis not present

## 2018-05-10 DIAGNOSIS — I110 Hypertensive heart disease with heart failure: Secondary | ICD-10-CM | POA: Diagnosis not present

## 2018-05-10 DIAGNOSIS — I5032 Chronic diastolic (congestive) heart failure: Secondary | ICD-10-CM | POA: Diagnosis not present

## 2018-05-10 DIAGNOSIS — S32050D Wedge compression fracture of fifth lumbar vertebra, subsequent encounter for fracture with routine healing: Secondary | ICD-10-CM | POA: Diagnosis not present

## 2018-05-10 DIAGNOSIS — J449 Chronic obstructive pulmonary disease, unspecified: Secondary | ICD-10-CM | POA: Diagnosis not present

## 2018-05-12 DIAGNOSIS — J449 Chronic obstructive pulmonary disease, unspecified: Secondary | ICD-10-CM | POA: Diagnosis not present

## 2018-05-12 DIAGNOSIS — I5032 Chronic diastolic (congestive) heart failure: Secondary | ICD-10-CM | POA: Diagnosis not present

## 2018-05-12 DIAGNOSIS — I4891 Unspecified atrial fibrillation: Secondary | ICD-10-CM | POA: Diagnosis not present

## 2018-05-12 DIAGNOSIS — G459 Transient cerebral ischemic attack, unspecified: Secondary | ICD-10-CM | POA: Diagnosis not present

## 2018-05-12 DIAGNOSIS — S32050D Wedge compression fracture of fifth lumbar vertebra, subsequent encounter for fracture with routine healing: Secondary | ICD-10-CM | POA: Diagnosis not present

## 2018-05-12 DIAGNOSIS — I110 Hypertensive heart disease with heart failure: Secondary | ICD-10-CM | POA: Diagnosis not present

## 2018-05-13 DIAGNOSIS — S32050D Wedge compression fracture of fifth lumbar vertebra, subsequent encounter for fracture with routine healing: Secondary | ICD-10-CM | POA: Diagnosis not present

## 2018-05-13 DIAGNOSIS — G459 Transient cerebral ischemic attack, unspecified: Secondary | ICD-10-CM | POA: Diagnosis not present

## 2018-05-13 DIAGNOSIS — I5032 Chronic diastolic (congestive) heart failure: Secondary | ICD-10-CM | POA: Diagnosis not present

## 2018-05-13 DIAGNOSIS — I4891 Unspecified atrial fibrillation: Secondary | ICD-10-CM | POA: Diagnosis not present

## 2018-05-13 DIAGNOSIS — I110 Hypertensive heart disease with heart failure: Secondary | ICD-10-CM | POA: Diagnosis not present

## 2018-05-13 DIAGNOSIS — J449 Chronic obstructive pulmonary disease, unspecified: Secondary | ICD-10-CM | POA: Diagnosis not present

## 2018-05-15 DIAGNOSIS — I4891 Unspecified atrial fibrillation: Secondary | ICD-10-CM | POA: Diagnosis not present

## 2018-05-15 DIAGNOSIS — J449 Chronic obstructive pulmonary disease, unspecified: Secondary | ICD-10-CM | POA: Diagnosis not present

## 2018-05-15 DIAGNOSIS — S32050D Wedge compression fracture of fifth lumbar vertebra, subsequent encounter for fracture with routine healing: Secondary | ICD-10-CM | POA: Diagnosis not present

## 2018-05-15 DIAGNOSIS — I5032 Chronic diastolic (congestive) heart failure: Secondary | ICD-10-CM | POA: Diagnosis not present

## 2018-05-15 DIAGNOSIS — G459 Transient cerebral ischemic attack, unspecified: Secondary | ICD-10-CM | POA: Diagnosis not present

## 2018-05-15 DIAGNOSIS — I110 Hypertensive heart disease with heart failure: Secondary | ICD-10-CM | POA: Diagnosis not present

## 2018-05-17 DIAGNOSIS — F17210 Nicotine dependence, cigarettes, uncomplicated: Secondary | ICD-10-CM | POA: Diagnosis not present

## 2018-05-17 DIAGNOSIS — Z7982 Long term (current) use of aspirin: Secondary | ICD-10-CM | POA: Diagnosis not present

## 2018-05-17 DIAGNOSIS — R2689 Other abnormalities of gait and mobility: Secondary | ICD-10-CM | POA: Diagnosis not present

## 2018-05-17 DIAGNOSIS — S32050D Wedge compression fracture of fifth lumbar vertebra, subsequent encounter for fracture with routine healing: Secondary | ICD-10-CM | POA: Diagnosis not present

## 2018-05-17 DIAGNOSIS — Z7984 Long term (current) use of oral hypoglycemic drugs: Secondary | ICD-10-CM | POA: Diagnosis not present

## 2018-05-17 DIAGNOSIS — G4089 Other seizures: Secondary | ICD-10-CM | POA: Diagnosis not present

## 2018-05-17 DIAGNOSIS — M6281 Muscle weakness (generalized): Secondary | ICD-10-CM | POA: Diagnosis not present

## 2018-05-17 DIAGNOSIS — I4891 Unspecified atrial fibrillation: Secondary | ICD-10-CM | POA: Diagnosis not present

## 2018-05-17 DIAGNOSIS — E119 Type 2 diabetes mellitus without complications: Secondary | ICD-10-CM | POA: Diagnosis not present

## 2018-05-17 DIAGNOSIS — Z87891 Personal history of nicotine dependence: Secondary | ICD-10-CM | POA: Diagnosis not present

## 2018-05-17 DIAGNOSIS — I110 Hypertensive heart disease with heart failure: Secondary | ICD-10-CM | POA: Diagnosis not present

## 2018-05-17 DIAGNOSIS — J449 Chronic obstructive pulmonary disease, unspecified: Secondary | ICD-10-CM | POA: Diagnosis not present

## 2018-05-17 DIAGNOSIS — G459 Transient cerebral ischemic attack, unspecified: Secondary | ICD-10-CM | POA: Diagnosis not present

## 2018-05-17 DIAGNOSIS — I5032 Chronic diastolic (congestive) heart failure: Secondary | ICD-10-CM | POA: Diagnosis not present

## 2018-05-18 DIAGNOSIS — S32050D Wedge compression fracture of fifth lumbar vertebra, subsequent encounter for fracture with routine healing: Secondary | ICD-10-CM | POA: Diagnosis not present

## 2018-05-18 DIAGNOSIS — I4891 Unspecified atrial fibrillation: Secondary | ICD-10-CM | POA: Diagnosis not present

## 2018-05-18 DIAGNOSIS — I110 Hypertensive heart disease with heart failure: Secondary | ICD-10-CM | POA: Diagnosis not present

## 2018-05-18 DIAGNOSIS — G459 Transient cerebral ischemic attack, unspecified: Secondary | ICD-10-CM | POA: Diagnosis not present

## 2018-05-18 DIAGNOSIS — J449 Chronic obstructive pulmonary disease, unspecified: Secondary | ICD-10-CM | POA: Diagnosis not present

## 2018-05-18 DIAGNOSIS — I5032 Chronic diastolic (congestive) heart failure: Secondary | ICD-10-CM | POA: Diagnosis not present

## 2018-05-19 DIAGNOSIS — J449 Chronic obstructive pulmonary disease, unspecified: Secondary | ICD-10-CM | POA: Diagnosis not present

## 2018-05-19 DIAGNOSIS — I4891 Unspecified atrial fibrillation: Secondary | ICD-10-CM | POA: Diagnosis not present

## 2018-05-19 DIAGNOSIS — S32050D Wedge compression fracture of fifth lumbar vertebra, subsequent encounter for fracture with routine healing: Secondary | ICD-10-CM | POA: Diagnosis not present

## 2018-05-19 DIAGNOSIS — I5032 Chronic diastolic (congestive) heart failure: Secondary | ICD-10-CM | POA: Diagnosis not present

## 2018-05-19 DIAGNOSIS — I110 Hypertensive heart disease with heart failure: Secondary | ICD-10-CM | POA: Diagnosis not present

## 2018-05-19 DIAGNOSIS — G459 Transient cerebral ischemic attack, unspecified: Secondary | ICD-10-CM | POA: Diagnosis not present

## 2018-05-21 DIAGNOSIS — G459 Transient cerebral ischemic attack, unspecified: Secondary | ICD-10-CM | POA: Diagnosis not present

## 2018-05-21 DIAGNOSIS — I5032 Chronic diastolic (congestive) heart failure: Secondary | ICD-10-CM | POA: Diagnosis not present

## 2018-05-21 DIAGNOSIS — I4891 Unspecified atrial fibrillation: Secondary | ICD-10-CM | POA: Diagnosis not present

## 2018-05-21 DIAGNOSIS — J449 Chronic obstructive pulmonary disease, unspecified: Secondary | ICD-10-CM | POA: Diagnosis not present

## 2018-05-21 DIAGNOSIS — I110 Hypertensive heart disease with heart failure: Secondary | ICD-10-CM | POA: Diagnosis not present

## 2018-05-21 DIAGNOSIS — S32050D Wedge compression fracture of fifth lumbar vertebra, subsequent encounter for fracture with routine healing: Secondary | ICD-10-CM | POA: Diagnosis not present

## 2018-05-25 DIAGNOSIS — G459 Transient cerebral ischemic attack, unspecified: Secondary | ICD-10-CM | POA: Diagnosis not present

## 2018-05-25 DIAGNOSIS — I5032 Chronic diastolic (congestive) heart failure: Secondary | ICD-10-CM | POA: Diagnosis not present

## 2018-05-25 DIAGNOSIS — S32050D Wedge compression fracture of fifth lumbar vertebra, subsequent encounter for fracture with routine healing: Secondary | ICD-10-CM | POA: Diagnosis not present

## 2018-05-25 DIAGNOSIS — I4891 Unspecified atrial fibrillation: Secondary | ICD-10-CM | POA: Diagnosis not present

## 2018-05-25 DIAGNOSIS — I110 Hypertensive heart disease with heart failure: Secondary | ICD-10-CM | POA: Diagnosis not present

## 2018-05-25 DIAGNOSIS — J449 Chronic obstructive pulmonary disease, unspecified: Secondary | ICD-10-CM | POA: Diagnosis not present

## 2018-05-28 DIAGNOSIS — S32050D Wedge compression fracture of fifth lumbar vertebra, subsequent encounter for fracture with routine healing: Secondary | ICD-10-CM | POA: Diagnosis not present

## 2018-05-28 DIAGNOSIS — I5032 Chronic diastolic (congestive) heart failure: Secondary | ICD-10-CM | POA: Diagnosis not present

## 2018-05-28 DIAGNOSIS — I4891 Unspecified atrial fibrillation: Secondary | ICD-10-CM | POA: Diagnosis not present

## 2018-05-28 DIAGNOSIS — I110 Hypertensive heart disease with heart failure: Secondary | ICD-10-CM | POA: Diagnosis not present

## 2018-05-28 DIAGNOSIS — G459 Transient cerebral ischemic attack, unspecified: Secondary | ICD-10-CM | POA: Diagnosis not present

## 2018-05-28 DIAGNOSIS — J449 Chronic obstructive pulmonary disease, unspecified: Secondary | ICD-10-CM | POA: Diagnosis not present

## 2018-06-01 DIAGNOSIS — I110 Hypertensive heart disease with heart failure: Secondary | ICD-10-CM | POA: Diagnosis not present

## 2018-06-01 DIAGNOSIS — S32050D Wedge compression fracture of fifth lumbar vertebra, subsequent encounter for fracture with routine healing: Secondary | ICD-10-CM | POA: Diagnosis not present

## 2018-06-01 DIAGNOSIS — J449 Chronic obstructive pulmonary disease, unspecified: Secondary | ICD-10-CM | POA: Diagnosis not present

## 2018-06-01 DIAGNOSIS — I4891 Unspecified atrial fibrillation: Secondary | ICD-10-CM | POA: Diagnosis not present

## 2018-06-01 DIAGNOSIS — I5032 Chronic diastolic (congestive) heart failure: Secondary | ICD-10-CM | POA: Diagnosis not present

## 2018-06-01 DIAGNOSIS — G459 Transient cerebral ischemic attack, unspecified: Secondary | ICD-10-CM | POA: Diagnosis not present

## 2018-06-08 DIAGNOSIS — I5032 Chronic diastolic (congestive) heart failure: Secondary | ICD-10-CM | POA: Diagnosis not present

## 2018-06-08 DIAGNOSIS — I4891 Unspecified atrial fibrillation: Secondary | ICD-10-CM | POA: Diagnosis not present

## 2018-06-08 DIAGNOSIS — G459 Transient cerebral ischemic attack, unspecified: Secondary | ICD-10-CM | POA: Diagnosis not present

## 2018-06-08 DIAGNOSIS — S32050D Wedge compression fracture of fifth lumbar vertebra, subsequent encounter for fracture with routine healing: Secondary | ICD-10-CM | POA: Diagnosis not present

## 2018-06-08 DIAGNOSIS — J449 Chronic obstructive pulmonary disease, unspecified: Secondary | ICD-10-CM | POA: Diagnosis not present

## 2018-06-08 DIAGNOSIS — I110 Hypertensive heart disease with heart failure: Secondary | ICD-10-CM | POA: Diagnosis not present

## 2018-06-13 ENCOUNTER — Encounter: Payer: Self-pay | Admitting: Emergency Medicine

## 2018-06-13 ENCOUNTER — Other Ambulatory Visit: Payer: Self-pay

## 2018-06-13 ENCOUNTER — Emergency Department: Payer: Medicare Other

## 2018-06-13 ENCOUNTER — Inpatient Hospital Stay: Payer: Medicare Other

## 2018-06-13 ENCOUNTER — Inpatient Hospital Stay
Admission: EM | Admit: 2018-06-13 | Discharge: 2018-06-15 | DRG: 641 | Disposition: A | Discharge status: Home Health | Payer: Medicare Other | Attending: Internal Medicine | Admitting: Internal Medicine

## 2018-06-13 DIAGNOSIS — Z1159 Encounter for screening for other viral diseases: Secondary | ICD-10-CM

## 2018-06-13 DIAGNOSIS — I2510 Atherosclerotic heart disease of native coronary artery without angina pectoris: Secondary | ICD-10-CM | POA: Diagnosis present

## 2018-06-13 DIAGNOSIS — Z9049 Acquired absence of other specified parts of digestive tract: Secondary | ICD-10-CM

## 2018-06-13 DIAGNOSIS — R259 Unspecified abnormal involuntary movements: Secondary | ICD-10-CM

## 2018-06-13 DIAGNOSIS — I5032 Chronic diastolic (congestive) heart failure: Secondary | ICD-10-CM | POA: Diagnosis present

## 2018-06-13 DIAGNOSIS — F17210 Nicotine dependence, cigarettes, uncomplicated: Secondary | ICD-10-CM | POA: Diagnosis present

## 2018-06-13 DIAGNOSIS — E876 Hypokalemia: Secondary | ICD-10-CM | POA: Diagnosis present

## 2018-06-13 DIAGNOSIS — I639 Cerebral infarction, unspecified: Secondary | ICD-10-CM | POA: Diagnosis present

## 2018-06-13 DIAGNOSIS — Z90710 Acquired absence of both cervix and uterus: Secondary | ICD-10-CM

## 2018-06-13 DIAGNOSIS — Z20828 Contact with and (suspected) exposure to other viral communicable diseases: Secondary | ICD-10-CM | POA: Diagnosis not present

## 2018-06-13 DIAGNOSIS — R5381 Other malaise: Secondary | ICD-10-CM | POA: Diagnosis not present

## 2018-06-13 DIAGNOSIS — R258 Other abnormal involuntary movements: Secondary | ICD-10-CM | POA: Diagnosis not present

## 2018-06-13 DIAGNOSIS — Z79890 Hormone replacement therapy (postmenopausal): Secondary | ICD-10-CM

## 2018-06-13 DIAGNOSIS — E1140 Type 2 diabetes mellitus with diabetic neuropathy, unspecified: Secondary | ICD-10-CM | POA: Diagnosis present

## 2018-06-13 DIAGNOSIS — Z85828 Personal history of other malignant neoplasm of skin: Secondary | ICD-10-CM

## 2018-06-13 DIAGNOSIS — Z955 Presence of coronary angioplasty implant and graft: Secondary | ICD-10-CM

## 2018-06-13 DIAGNOSIS — Z791 Long term (current) use of non-steroidal anti-inflammatories (NSAID): Secondary | ICD-10-CM | POA: Diagnosis not present

## 2018-06-13 DIAGNOSIS — I110 Hypertensive heart disease with heart failure: Secondary | ICD-10-CM | POA: Diagnosis present

## 2018-06-13 DIAGNOSIS — E785 Hyperlipidemia, unspecified: Secondary | ICD-10-CM | POA: Diagnosis present

## 2018-06-13 DIAGNOSIS — E119 Type 2 diabetes mellitus without complications: Secondary | ICD-10-CM | POA: Diagnosis not present

## 2018-06-13 DIAGNOSIS — R531 Weakness: Secondary | ICD-10-CM | POA: Diagnosis present

## 2018-06-13 DIAGNOSIS — E039 Hypothyroidism, unspecified: Secondary | ICD-10-CM | POA: Diagnosis present

## 2018-06-13 DIAGNOSIS — R200 Anesthesia of skin: Secondary | ICD-10-CM | POA: Diagnosis not present

## 2018-06-13 DIAGNOSIS — G40909 Epilepsy, unspecified, not intractable, without status epilepticus: Secondary | ICD-10-CM | POA: Diagnosis present

## 2018-06-13 DIAGNOSIS — Z833 Family history of diabetes mellitus: Secondary | ICD-10-CM

## 2018-06-13 DIAGNOSIS — Z7951 Long term (current) use of inhaled steroids: Secondary | ICD-10-CM | POA: Diagnosis not present

## 2018-06-13 DIAGNOSIS — I4891 Unspecified atrial fibrillation: Secondary | ICD-10-CM | POA: Diagnosis present

## 2018-06-13 DIAGNOSIS — R29701 NIHSS score 1: Secondary | ICD-10-CM | POA: Diagnosis present

## 2018-06-13 DIAGNOSIS — I69354 Hemiplegia and hemiparesis following cerebral infarction affecting left non-dominant side: Secondary | ICD-10-CM | POA: Diagnosis not present

## 2018-06-13 DIAGNOSIS — K219 Gastro-esophageal reflux disease without esophagitis: Secondary | ICD-10-CM | POA: Diagnosis present

## 2018-06-13 DIAGNOSIS — R202 Paresthesia of skin: Secondary | ICD-10-CM | POA: Diagnosis not present

## 2018-06-13 DIAGNOSIS — Z7902 Long term (current) use of antithrombotics/antiplatelets: Secondary | ICD-10-CM

## 2018-06-13 DIAGNOSIS — Z823 Family history of stroke: Secondary | ICD-10-CM | POA: Diagnosis not present

## 2018-06-13 DIAGNOSIS — J449 Chronic obstructive pulmonary disease, unspecified: Secondary | ICD-10-CM | POA: Diagnosis present

## 2018-06-13 DIAGNOSIS — Z7984 Long term (current) use of oral hypoglycemic drugs: Secondary | ICD-10-CM | POA: Diagnosis not present

## 2018-06-13 DIAGNOSIS — Z66 Do not resuscitate: Secondary | ICD-10-CM | POA: Diagnosis present

## 2018-06-13 DIAGNOSIS — Z79899 Other long term (current) drug therapy: Secondary | ICD-10-CM | POA: Diagnosis not present

## 2018-06-13 DIAGNOSIS — Z7982 Long term (current) use of aspirin: Secondary | ICD-10-CM | POA: Diagnosis not present

## 2018-06-13 DIAGNOSIS — I259 Chronic ischemic heart disease, unspecified: Secondary | ICD-10-CM | POA: Diagnosis not present

## 2018-06-13 LAB — CBC WITH DIFFERENTIAL/PLATELET
Abs Immature Granulocytes: 0.02 10*3/uL (ref 0.00–0.07)
Basophils Absolute: 0.1 10*3/uL (ref 0.0–0.1)
Basophils Relative: 1 %
Eosinophils Absolute: 0.2 10*3/uL (ref 0.0–0.5)
Eosinophils Relative: 3 %
HCT: 38.4 % (ref 36.0–46.0)
Hemoglobin: 12.4 g/dL (ref 12.0–15.0)
Immature Granulocytes: 0 %
Lymphocytes Relative: 33 %
Lymphs Abs: 2.4 10*3/uL (ref 0.7–4.0)
MCH: 29.1 pg (ref 26.0–34.0)
MCHC: 32.3 g/dL (ref 30.0–36.0)
MCV: 90.1 fL (ref 80.0–100.0)
Monocytes Absolute: 0.6 10*3/uL (ref 0.1–1.0)
Monocytes Relative: 8 %
Neutro Abs: 4 10*3/uL (ref 1.7–7.7)
Neutrophils Relative %: 55 %
Platelets: 283 10*3/uL (ref 150–400)
RBC: 4.26 MIL/uL (ref 3.87–5.11)
RDW: 14.7 % (ref 11.5–15.5)
WBC: 7.3 10*3/uL (ref 4.0–10.5)
nRBC: 0 % (ref 0.0–0.2)

## 2018-06-13 LAB — URINALYSIS, COMPLETE (UACMP) WITH MICROSCOPIC
Bilirubin Urine: NEGATIVE
Glucose, UA: NEGATIVE mg/dL
Hgb urine dipstick: NEGATIVE
Ketones, ur: NEGATIVE mg/dL
Leukocytes,Ua: NEGATIVE
Nitrite: NEGATIVE
Protein, ur: NEGATIVE mg/dL
Specific Gravity, Urine: 1.016 (ref 1.005–1.030)
pH: 6 (ref 5.0–8.0)

## 2018-06-13 LAB — COMPREHENSIVE METABOLIC PANEL
ALT: 13 U/L (ref 0–44)
AST: 18 U/L (ref 15–41)
Albumin: 3.6 g/dL (ref 3.5–5.0)
Alkaline Phosphatase: 112 U/L (ref 38–126)
Anion gap: 10 (ref 5–15)
BUN: 9 mg/dL (ref 8–23)
CO2: 25 mmol/L (ref 22–32)
Calcium: 8.6 mg/dL — ABNORMAL LOW (ref 8.9–10.3)
Chloride: 105 mmol/L (ref 98–111)
Creatinine, Ser: 0.71 mg/dL (ref 0.44–1.00)
GFR calc Af Amer: >60 mL/min (ref >60)
GFR calc non Af Amer: >60 mL/min (ref >60)
Glucose, Bld: 155 mg/dL — ABNORMAL HIGH (ref 70–99)
Potassium: 2.8 mmol/L — ABNORMAL LOW (ref 3.5–5.1)
Sodium: 140 mmol/L (ref 135–145)
Total Bilirubin: 0.3 mg/dL (ref 0.3–1.2)
Total Protein: 6.6 g/dL (ref 6.5–8.1)

## 2018-06-13 LAB — GLUCOSE, CAPILLARY: Glucose-Capillary: 131 mg/dL — ABNORMAL HIGH (ref 70–99)

## 2018-06-13 LAB — MAGNESIUM: Magnesium: 2.2 mg/dL (ref 1.7–2.4)

## 2018-06-13 LAB — TROPONIN I: Troponin I: <0.03 ng/mL (ref <0.03)

## 2018-06-13 LAB — POTASSIUM: Potassium: 3.5 mmol/L (ref 3.5–5.1)

## 2018-06-13 LAB — SARS CORONAVIRUS 2 BY RT PCR (HOSPITAL ORDER, PERFORMED IN ~~LOC~~ HOSPITAL LAB): SARS Coronavirus 2: NEGATIVE

## 2018-06-13 MED ORDER — INSULIN ASPART 100 UNIT/ML ~~LOC~~ SOLN: 0.0000 [IU] | Freq: Every day | SUBCUTANEOUS | Status: DC

## 2018-06-13 MED ORDER — FLUTICASONE PROPIONATE 50 MCG/ACT NA SUSP
1.0000 | Freq: Every day | NASAL | Status: DC
  Administered 2018-06-14 – 2018-06-15 (×2): 1 via NASAL
  Filled 2018-06-13: qty 16

## 2018-06-13 MED ORDER — ATORVASTATIN CALCIUM 20 MG PO TABS
80.0000 mg | ORAL_TABLET | Freq: Every day | ORAL | Status: DC
  Administered 2018-06-14: 80 mg via ORAL
  Filled 2018-06-13: qty 4

## 2018-06-13 MED ORDER — ACETAMINOPHEN 650 MG RE SUPP: 650.0000 mg | RECTAL | Status: DC | PRN

## 2018-06-13 MED ORDER — ACETAMINOPHEN 160 MG/5ML PO SOLN
650.0000 mg | ORAL | Status: DC | PRN
  Filled 2018-06-13: qty 20.3

## 2018-06-13 MED ORDER — PANTOPRAZOLE SODIUM 40 MG PO TBEC
40.0000 mg | DELAYED_RELEASE_TABLET | Freq: Every day | ORAL | Status: DC
  Administered 2018-06-14 – 2018-06-15 (×2): 40 mg via ORAL
  Filled 2018-06-13 (×2): qty 1

## 2018-06-13 MED ORDER — LEVOTHYROXINE SODIUM 112 MCG PO TABS
112.0000 ug | ORAL_TABLET | Freq: Every day | ORAL | Status: DC
  Administered 2018-06-14 – 2018-06-15 (×2): 112 ug via ORAL
  Filled 2018-06-13 (×2): qty 1

## 2018-06-13 MED ORDER — COLESTIPOL HCL 1 G PO TABS
2.0000 g | ORAL_TABLET | Freq: Two times a day (BID) | ORAL | Status: DC
  Administered 2018-06-13 – 2018-06-15 (×4): 2 g via ORAL
  Filled 2018-06-13 (×5): qty 2

## 2018-06-13 MED ORDER — SENNOSIDES-DOCUSATE SODIUM 8.6-50 MG PO TABS: 1.0000 | ORAL_TABLET | Freq: Every evening | ORAL | Status: DC | PRN

## 2018-06-13 MED ORDER — CLOPIDOGREL BISULFATE 75 MG PO TABS
75.0000 mg | ORAL_TABLET | Freq: Every day | ORAL | Status: DC
  Administered 2018-06-14 – 2018-06-15 (×2): 75 mg via ORAL
  Filled 2018-06-13 (×2): qty 1

## 2018-06-13 MED ORDER — LORAZEPAM 1 MG PO TABS
1.0000 mg | ORAL_TABLET | Freq: Once | ORAL | Status: AC
  Administered 2018-06-13: 17:00:00 1 mg via ORAL

## 2018-06-13 MED ORDER — LEVETIRACETAM 500 MG PO TABS
500.0000 mg | ORAL_TABLET | Freq: Two times a day (BID) | ORAL | Status: DC
  Administered 2018-06-13 – 2018-06-15 (×4): 500 mg via ORAL
  Filled 2018-06-13 (×4): qty 1

## 2018-06-13 MED ORDER — UMECLIDINIUM BROMIDE 62.5 MCG/INH IN AEPB
1.0000 | INHALATION_SPRAY | Freq: Every day | RESPIRATORY_TRACT | Status: DC
  Administered 2018-06-14 – 2018-06-15 (×2): 1 via RESPIRATORY_TRACT
  Filled 2018-06-13: qty 7

## 2018-06-13 MED ORDER — SODIUM CHLORIDE 0.9 % IV SOLN: INTRAVENOUS | Status: DC

## 2018-06-13 MED ORDER — ALBUTEROL SULFATE (2.5 MG/3ML) 0.083% IN NEBU: 2.5000 mg | INHALATION_SOLUTION | Freq: Four times a day (QID) | RESPIRATORY_TRACT | Status: DC | PRN

## 2018-06-13 MED ORDER — ENOXAPARIN SODIUM 40 MG/0.4ML ~~LOC~~ SOLN: 40.0000 mg | SUBCUTANEOUS | Status: DC

## 2018-06-13 MED ORDER — ASPIRIN 81 MG PO CHEW
81.0000 mg | CHEWABLE_TABLET | Freq: Every day | ORAL | Status: DC
  Administered 2018-06-14 – 2018-06-15 (×2): 81 mg via ORAL
  Filled 2018-06-13 (×2): qty 1

## 2018-06-13 MED ORDER — FERROUS SULFATE 325 (65 FE) MG PO TABS
325.0000 mg | ORAL_TABLET | Freq: Every day | ORAL | Status: DC
  Administered 2018-06-14 – 2018-06-15 (×2): 325 mg via ORAL
  Filled 2018-06-13 (×3): qty 1

## 2018-06-13 MED ORDER — SUCRALFATE 1 G PO TABS
1.0000 g | ORAL_TABLET | Freq: Four times a day (QID) | ORAL | Status: DC
  Administered 2018-06-13 – 2018-06-15 (×5): 1 g via ORAL
  Filled 2018-06-13 (×5): qty 1

## 2018-06-13 MED ORDER — POTASSIUM CHLORIDE CRYS ER 20 MEQ PO TBCR
40.0000 meq | EXTENDED_RELEASE_TABLET | Freq: Once | ORAL | Status: AC
  Administered 2018-06-13: 14:00:00 40 meq via ORAL
  Filled 2018-06-13: qty 2

## 2018-06-13 MED ORDER — INSULIN ASPART 100 UNIT/ML ~~LOC~~ SOLN: 0.0000 [IU] | Freq: Three times a day (TID) | SUBCUTANEOUS | Status: DC

## 2018-06-13 MED ORDER — CYANOCOBALAMIN 500 MCG PO TABS
500.0000 ug | ORAL_TABLET | Freq: Every day | ORAL | Status: DC
  Administered 2018-06-14: 500 ug via ORAL
  Filled 2018-06-13 (×2): qty 1

## 2018-06-13 MED ORDER — ACETAMINOPHEN 325 MG PO TABS: 650.0000 mg | ORAL_TABLET | ORAL | Status: DC | PRN

## 2018-06-13 MED ORDER — GABAPENTIN 400 MG PO CAPS
800.0000 mg | ORAL_CAPSULE | Freq: Two times a day (BID) | ORAL | Status: DC
  Administered 2018-06-13 – 2018-06-15 (×4): 800 mg via ORAL
  Filled 2018-06-13 (×5): qty 2

## 2018-06-13 MED ORDER — DULOXETINE HCL 30 MG PO CPEP
60.0000 mg | ORAL_CAPSULE | Freq: Every day | ORAL | Status: DC
  Administered 2018-06-13 – 2018-06-14 (×2): 60 mg via ORAL
  Filled 2018-06-13 (×2): qty 2

## 2018-06-13 MED ORDER — STROKE: EARLY STAGES OF RECOVERY BOOK: Freq: Once | Status: DC

## 2018-06-13 NOTE — ED Notes (Signed)
Patient transported to MRI 

## 2018-06-13 NOTE — Progress Notes (Signed)
Care Alignment Note  Advanced Directives Documents (Living Will, Power of Attorney) currently in the EHR no advanced directives documents available .  Has the patient discussed their wishes with their family/healthcare power of attorney no.  What does the patient/decision maker understand about their medical condition and the natural course of their disease.  Acute CVA.  Hypokalemia.  Hypertension.  Hyperlipidemia.  Diabetes mellitus.  Hypothyroidism and tobacco abuse  What is the patient/decision maker's biggest fear or concern for the future becoming a burden to my family  What is the most important goal for this patient should their health condition worsen maintenance of function.  Current   Code Status: DNR  Current code status has been reviewed/updated.  Time spent:20 minutes

## 2018-06-13 NOTE — ED Triage Notes (Signed)
Pt presents from home via [facility] with c/o "mouth moving without her doing it". Pt c/o tingling in left hand as well. hx of diabetes.

## 2018-06-13 NOTE — ED Provider Notes (Signed)
[facility] EMERGENCY DEPARTMENT Provider Note   CSN: [id number] Arrival date & time: 06/13/18  1210    History   Chief Complaint Chief Complaint  Patient presents with   Numbness    HPI Tauriel Scronce is a 73 y.o. female.     73 year old female presents to the emergency department after acute onset of left hand numbness and involuntary movement of her mouth. Symptoms started about 11:00am. She has a history of CVA with residual left facial droop and left arm and leg weakness. No new weakness today, but the fingers of the left hand are numb, which she has never experienced before. No alleviating measures prior to arrival.     Past Medical History:  Diagnosis Date   Acid reflux    Arthritis    Bilateral knees, hands, feet   Asthma    Atrial fibrillation (HCC)    Cancer (HCC)    CHF (congestive heart failure) (HCC)    Diastolic CHF   COPD (chronic obstructive pulmonary disease) (HCC)    Coronary artery disease    Diabetes mellitus without complication (HCC)    Frequent headaches    GI bleed    HLD (hyperlipidemia)    Hypertension    Seizures ([place])    Skin cancer 2015   Suspected basal cell carcinoma on nose, surgically removed   Stroke ([place]) 04/2017   Thyroid disease    Tremors of nervous system     Patient Active Problem List   Diagnosis Date Noted   Hypotension 03/18/2018   Malnutrition of moderate degree 03/16/2018   Closed fracture of lumbar vertebral body ([place]) 03/15/2018   Speaking difficulty 03/13/2018   TIA (transient ischemic attack) 09/22/2017   PAF (paroxysmal atrial fibrillation) ([place])    Acute CVA (cerebrovascular accident) ([place]) 07/30/2017   GI bleed 07/08/2017   Anemia 07/08/2017   Angiodysplasia of stomach and duodenum with hemorrhage    CVA (cerebral vascular accident) ([place]) 07/06/2017   Stroke (cerebrum) ([place])    Goals of care, counseling/discussion    Palliative care encounter      COPD exacerbation ([place]) 04/03/2017   Flu 03/27/2017   Iron deficiency anemia due to chronic blood loss    Migraine 02/13/2017   Cataracts, bilateral 01/15/2017   Chronic venous insufficiency 10/06/2016   Recurrent major depressive disorder, in partial remission ([place]) 10/06/2016   Left-sided weakness 10/03/2016   Chest pain 02/27/2016   History of stroke 02/19/2016   Thumb pain, left 01/22/2016   Traumatic closed nondisplaced fracture of base of metacarpal bone of left thumb 01/22/2016   Colon cancer screening 11/27/2015   Depression 11/14/2015   Coronary artery disease 11/13/2015   Hypertension 11/13/2015   History of skin cancer 11/13/2015   Osteoporosis 11/13/2015   Chronic low back pain 11/13/2015   Left medial knee pain 11/13/2015   Osteoarthritis of multiple joints 11/13/2015   Other specified hypothyroidism 08/16/2015   Controlled type 2 diabetes mellitus with diabetic neuropathy ([place]) 08/16/2015   COPD (chronic obstructive pulmonary disease) ([place]) 08/16/2015   Tobacco abuse 08/16/2015   HLD (hyperlipidemia) 08/16/2015   History of CHF (congestive heart failure) 08/16/2015   Seizure disorder ([place])     Past Surgical History:  Procedure Laterality Date   ABDOMINAL HYSTERECTOMY  [place]   COLONOSCOPY N/A 02/20/2017   Procedure: COLONOSCOPY;  Surgeon: Lin Landsman, MD;  Location: [facility] ENDOSCOPY;  Service: Gastroenterology;  Laterality: N/A;   ESOPHAGOGASTRODUODENOSCOPY N/A 02/20/2017   Procedure: ESOPHAGOGASTRODUODENOSCOPY (EGD);  Surgeon: Lin Landsman, MD;  Location: [facility] ENDOSCOPY;  Service: Gastroenterology;  Laterality: N/A;   ESOPHAGOGASTRODUODENOSCOPY (EGD) WITH PROPOFOL N/A 07/08/2017   Procedure: ESOPHAGOGASTRODUODENOSCOPY (EGD) WITH PROPOFOL;  Surgeon: [name], MD;  Location: [facility] ENDOSCOPY;  Service: Endoscopy;  Laterality:  N/A;   GALLBLADDER SURGERY  1980   GIVENS CAPSULE STUDY N/A 07/09/2017   Procedure: GIVENS CAPSULE STUDY;  Surgeon: Jonathon Bellows, MD;  Location[facility] ENDOSCOPY;  Service: Gastroenterology;  Laterality: N/A;   KNEE SURGERY     left   KYPHOPLASTY N/A 03/16/2018   Procedure: KYPHOPLASTY, L5;  Surgeon: Hessie Knows, MD;  Location: [facility] ORS;  Service: Orthopedics;  Laterality: N/A;   ROTATOR CUFF REPAIR     right   TONSILLECTOMY  1950     OB History   No obstetric history on file.      Home Medications    Prior to Admission medications   Medication Sig Start Date End Date Taking? Authorizing Provider  albuterol (PROVENTIL HFA;VENTOLIN HFA) 108 (90 Base) MCG/ACT inhaler Inhale 2 puffs into the lungs every 6 (six) hours as needed for wheezing or shortness of breath. 10/08/16   Karamalegos, Devonne Doughty, DO  aspirin (ASPIRIN CHILDRENS) 81 MG chewable tablet Chew 1 tablet (81 mg total) by mouth daily. 03/17/18   Bettey Costa, MD  atorvastatin (LIPITOR) 80 MG tablet Take 1 tablet (80 mg total) by mouth daily at 6 PM. 09/24/16   Karamalegos, Devonne Doughty, DO  clopidogrel (PLAVIX) 75 MG tablet Take 1 tablet (75 mg total) by mouth daily. 03/17/18 03/17/19  Bettey Costa, MD  colestipol (COLESTID) 1 g tablet Take 2 g by mouth 2 (two) times daily. 10/13/17   [provider]  diclofenac sodium (VOLTAREN) 1 % GEL Apply 2 g topically 4 (four) times daily. 01/11/16   Daymon Larsen, MD  DULoxetine (CYMBALTA) 60 MG capsule Take 1 capsule (60 mg total) by mouth at bedtime. 09/24/16   Karamalegos, Devonne Doughty, DO  ferrous sulfate 325 (65 FE) MG EC tablet Take 1 tablet (325 mg total) by mouth 2 (two) times daily with a meal. Patient taking differently: Take 325 mg by mouth daily.  02/21/17   Hillary Bow, MD  fluticasone (FLONASE) 50 MCG/ACT nasal spray Place 1 spray into both nostrils daily. 10/08/16   Karamalegos, Devonne Doughty, DO  gabapentin (NEURONTIN) 800 MG tablet Take 1 tablet (800 mg total) by mouth 2  (two) times daily. 09/24/16   Karamalegos, Devonne Doughty, DO  glipiZIDE-metformin (METAGLIP) 5-500 MG tablet Take 1 tablet by mouth 2 (two) times daily before a meal. Patient taking differently: Take 1-2 tablets by mouth 2 (two) times daily before a meal. 2 tablets in the morning and 1 tablet in the evening 04/02/17   Karamalegos, Devonne Doughty, DO  levETIRAcetam (KEPPRA) 500 MG tablet Take 1 tablet (500 mg total) by mouth 2 (two) times daily. 09/19/15   Epifanio Lesches, MD  levothyroxine (SYNTHROID, LEVOTHROID) 112 MCG tablet Take 1 tablet (112 mcg total) by mouth daily before breakfast. 09/24/17   Geradine Girt, DO  lisinopril (PRINIVIL,ZESTRIL) 10 MG tablet Take 2 tablets (20 mg total) by mouth daily. 03/19/18   Bettey Costa, MD  nitroGLYCERIN (NITROSTAT) 0.4 MG SL tablet Place 1 tablet (0.4 mg total) under the tongue every 5 (five) minutes as needed for chest pain. 02/28/16   Bettey Costa, MD  omeprazole (PRILOSEC) 20  MG capsule Take 20 mg by mouth 2 (two) times daily. 09/02/17   [provider]  sucralfate (CARAFATE) 1 g tablet TAKE (1) TABLET BY MOUTH FOUR TIMES A DAY Patient taking differently: Take 1 g by mouth 4 (four) times daily.  03/26/17   Karamalegos, Devonne Doughty, DO  umeclidinium bromide (INCRUSE ELLIPTA) 62.5 MCG/INH AEPB Inhale 1 puff into the lungs daily. 11/13/15   Karamalegos, Devonne Doughty, DO  vitamin B-12 (CYANOCOBALAMIN) 500 MCG tablet Take 1 tablet by mouth daily.  06/29/17   [provider]    Family History Family History  Problem Relation Age of Onset   Breast cancer Mother    Heart disease Mother    Stroke Mother    Cancer Mother    COPD Mother    Diabetes Mother    Heart disease Father    Diabetes Father    Stroke Father    Alcohol abuse Sister    Drug abuse Sister    Stroke Sister    Cancer Sister    Mental illness Sister    Heart disease Brother    Arthritis Brother    Diabetes Brother    Heart disease Maternal Grandfather     Heart disease Paternal Grandfather     Social History Social History   Tobacco Use   Smoking status: Current Every Day Smoker    Packs/day: 1.00    Years: 53.00    Pack years: 53.00    Types: Cigarettes   Smokeless tobacco: Never Used  Substance Use Topics   Alcohol use: No   Drug use: No     Allergies   Ivp dye [iodinated diagnostic agents]; Methotrexate; Morphine and related; Mushroom extract complex; Atenolol; Percocet [oxycodone-acetaminophen]; Percodan [oxycodone-aspirin]; Tramadol; Verapamil; and Oxycodone hcl   Review of Systems Review of Systems  Constitutional: Negative for chills, diaphoresis and fever.  HENT: Negative for drooling, hearing loss and trouble swallowing.   Eyes: Negative for visual disturbance.  Respiratory: Negative for chest tightness.   Cardiovascular: Negative for chest pain and palpitations.  Gastrointestinal: Negative for nausea and vomiting.  Musculoskeletal: Negative for arthralgias and neck pain.  Skin: Negative for color change.  Neurological: Positive for facial asymmetry and weakness. Negative for dizziness, syncope, speech difficulty and headaches.  Psychiatric/Behavioral: Negative.  Negative for confusion.     Physical Exam Updated Vital Signs BP (!) 118/57    Pulse (!) 57    Temp 97.8 F (36.6 C) (Oral)    Resp (!) 21    Ht 5\' 1"  (1.549 m)    Wt 72.1 kg    SpO2 97%    BMI 30.04 kg/m   Physical Exam Constitutional:      General: She is not in acute distress.    Appearance: She is not ill-appearing or diaphoretic.  HENT:     Head: Normocephalic.     Nose: No rhinorrhea.     Mouth/Throat:     Mouth: Mucous membranes are moist.  Eyes:     Extraocular Movements: Extraocular movements intact.     Pupils: Pupils are equal, round, and reactive to light.  Neck:     Musculoskeletal: No neck rigidity.  Cardiovascular:     Rate and Rhythm: Bradycardia present.     Pulses: Normal pulses.     Heart sounds: Normal heart sounds.   Pulmonary:     Effort: Pulmonary effort is normal.  Abdominal:     General: Bowel sounds are normal.     Palpations: Abdomen is  soft.  Musculoskeletal:     Comments: Left hand held in closed fist with difficulty extending fingers.  Skin:    General: Skin is warm and dry.     Capillary Refill: Capillary refill takes less than 2 seconds.  Neurological:     Mental Status: She is alert.     GCS: GCS eye subscore is 4. GCS verbal subscore is 5. GCS motor subscore is 6.     Comments: Left leg weakness at baseline post CVA  Psychiatric:        Mood and Affect: Mood normal.        Behavior: Behavior normal.      ED Treatments / Results  Labs (all labs ordered are listed, but only abnormal results are displayed) Labs Reviewed  COMPREHENSIVE METABOLIC PANEL - Abnormal; Notable for the following components:      Result Value   Potassium 2.8 (*)    Glucose, Bld 155 (*)    Calcium 8.6 (*)    All other components within normal limits  URINALYSIS, COMPLETE (UACMP) WITH MICROSCOPIC - Abnormal; Notable for the following components:   Color, Urine YELLOW (*)    APPearance HAZY (*)    Bacteria, UA RARE (*)    All other components within normal limits  SARS CORONAVIRUS 2 (HOSPITAL ORDER, [place] [facility])  CBC WITH DIFFERENTIAL/PLATELET  TROPONIN I  CBG MONITORING, ED    EKG None  Radiology Ct Head Wo Contrast  Result Date: 06/13/2018 CLINICAL DATA:  Uncontrollable mouth movements for 2 days. Difficulty opening left hand. EXAM: CT HEAD WITHOUT CONTRAST TECHNIQUE: Contiguous axial images were obtained from the base of the skull through the vertex without intravenous contrast. COMPARISON:  March 13, 2018 FINDINGS: Brain: No subdural, epidural, or subarachnoid hemorrhage. Stable left posterior parietal infarct with encephalomalacia. Lacunar infarcts in the cerebellum, involving both hemispheres. Ventricles and sulci are stable. White matter changes are stable. No  acute ischemia noted. No mass effect or midline shift. Vascular: Calcified atherosclerosis in the intracranial carotids. Skull: Normal. Negative for fracture or focal lesion. Sinuses/Orbits: No acute finding. Other: None. IMPRESSION: Chronic white matter changes, lacunar infarcts, and a left posterior parietal infarct. No acute intracranial abnormalities. Electronically Signed   By: Dorise Bullion III M.D   On: 06/13/2018 13:41    Procedures Procedures (including critical care time)  Medications Ordered in ED Medications  potassium chloride SA (K-DUR) CR tablet 40 mEq (40 mEq Oral Given 06/13/18 1401)     Initial Impression / Assessment and Plan / ED Course  I have reviewed the triage vital signs and the nursing notes.  Pertinent labs & imaging results that were available during my care of the patient were reviewed by me and considered in my medical decision making (see chart for details).        73 year old female presenting to the emergency department for acute onset of left hand numbness and involuntary movements of her mouth.  Involuntary movements of the mouth were observed.  Patient denies pain.  Symptoms started at approximately 11 this morning.  No acute CVA on CT scan.  She was noted to be a bit hypokalemic.  K-Dur was given.  Patient will be admitted for further evaluation.  Final Clinical Impressions(s) / ED Diagnoses   Final diagnoses:  Numbness and tingling in left hand  Abnormal involuntary movement    ED Discharge Orders    None       Victorino Dike, FNP 06/13/18 1635  Arta Silence, MD 06/14/18 2231

## 2018-06-13 NOTE — H&P (Addendum)
[facility] NAME: Samantha Aguirre    MR#:  [id number]  DATE OF BIRTH:  Sep 16, 1945  DATE OF ADMISSION:  06/13/2018  PRIMARY CARE PHYSICIAN: Tracie Harrier, MD   REQUESTING/REFERRING PHYSICIAN: Sherrie George  CHIEF COMPLAINT:   Chief Complaint  Patient presents with  . Numbness  Left hand numbness  HISTORY OF PRESENT ILLNESS:  Samantha Aguirre  is a 73 y.o. female with a known history of CVA with residual left-sided hemiparesis chronic diastolic CHF, COPD, hypertension, seizure disorder, hypothyroidism, CAD status post stent placement and tobacco abuse who presented to the emergency room with complaints of left hand numbness and involuntary movement of her mouth that started at about 11 AM this morning.  No new weakness on the left side.  No chest pain.  No shortness of breath.  No fevers.  Patient was evaluated in the emergency room and CT scan of the head done without contrast was negative for any acute findings.  COVID test was also negative.  Patient already took her medications today including her aspirin prior to presentation.  Medical service called to admit patient for further evaluation of CVA diagnosed clinically.  PAST MEDICAL HISTORY:   Past Medical History:  Diagnosis Date  . Acid reflux   . Arthritis    Bilateral knees, hands, feet  . Asthma   . Atrial fibrillation ([place])   . Cancer ([place])   . CHF (congestive heart failure) (H[facility])    Diastolic CHF  . COPD (chronic obstructive pulmonary disease) ([place])   . Coronary artery disease   . Diabetes mellitus without complication ([place])   . Frequent headaches   . GI bleed   . HLD (hyperlipidemia)   . Hypertension   . Seizures ([place])   . Skin cancer 2015   Suspected basal cell carcinoma on nose, surgically removed  . Stroke ([place]) 04/2017  . Thyroid disease   . Tremors of nervous system     PAST SURGICAL HISTORY:   Past Surgical History:  Procedure Laterality  Date  . ABDOMINAL HYSTERECTOMY  1976  . APPENDECTOMY  1980  . BACK SURGERY    . CAROTID ENDARTERECTOMY    . CHOLECYSTECTOMY  1980  . COLONOSCOPY N/A 02/20/2017   Procedure: COLONOSCOPY;  Surgeon: Lin Landsman, MD;  Location: [facility] ENDOSCOPY;  Service: Gastroenterology;  Laterality: N/A;  . ESOPHAGOGASTRODUODENOSCOPY N/A 02/20/2017   Procedure: ESOPHAGOGASTRODUODENOSCOPY (EGD);  Surgeon: Lin Landsman, MD;  Location: [facility] ENDOSCOPY;  Service: Gastroenterology;  Laterality: N/A;  . ESOPHAGOGASTRODUODENOSCOPY (EGD) WITH PROPOFOL N/A 07/08/2017   Procedure: ESOPHAGOGASTRODUODENOSCOPY (EGD) WITH PROPOFOL;  Surgeon: [name], MD;  Location: [facility] ENDOSCOPY;  Service: Endoscopy;  Laterality: N/A;  . [place]  . GIVENS CAPSULE STUDY N/A 07/09/2017   Procedure: GIVENS CAPSULE STUDY;  Surgeon: Jonathon Bellows, MD;  Location: [facility] ENDOSCOPY;  Service: Gastroenterology;  Laterality: N/A;  . KNEE SURGERY     left  . KYPHOPLASTY N/A 03/16/2018   Procedure: KYPHOPLASTY, L5;  Surgeon: Hessie Knows, MD;  Location: [facility] ORS;  Service: Orthopedics;  Laterality: N/A;  . ROTATOR CUFF REPAIR     right  . TONSILLECTOMY  1950    SOCIAL HISTORY:   Social History   Tobacco Use  . Smoking status: Current Every Day Smoker    Packs/day: 1.00    Years: 53.00    Pack years: 53.00    Types: Cigarettes  . Smokeless tobacco: Never Used  Substance Use Topics  .  Alcohol use: No    FAMILY HISTORY:   Family History  Problem Relation Age of Onset  . Breast cancer Mother   . Heart disease Mother   . Stroke Mother   . Cancer Mother   . COPD Mother   . Diabetes Mother   . Heart disease Father   . Diabetes Father   . Stroke Father   . Alcohol abuse Sister   . Drug abuse Sister   . Stroke Sister   . Cancer Sister   . Mental illness Sister   . Heart disease Brother   . Arthritis Brother   . Diabetes Brother   . Heart disease Maternal Grandfather   . Heart disease Paternal  Grandfather     DRUG ALLERGIES:   Allergies  Allergen Reactions  . Ivp Dye [Iodinated Diagnostic Agents] Itching    Pt injected with IV contrast.  5 min after injection pt complained of itching behind ear and on her abd.  . Methotrexate Rash  . Morphine And Related Other (See Comments)    "stops my breathing" NEAR-RESPIRATORY ARREST  . Mushroom Extract Complex Anaphylaxis    Made patient "deathly sick"  . Atenolol Other (See Comments)    "MD took me off of it bc it was doing something wrong" Made patient HYPOTENSIVE  . Percocet [Oxycodone-Acetaminophen] Nausea And Vomiting and Other (See Comments)    Stomach pains  . Percodan [Oxycodone-Aspirin] Nausea And Vomiting and Other (See Comments)    Stomach pains  . Tramadol Nausea Only  . Verapamil Other (See Comments)    " MD told me this was screwing me up" MAKES PATIENT HYPOTENSIVE  . Oxycodone Hcl     REVIEW OF SYSTEMS:   Review of Systems  Constitutional: Negative for chills and fever.  HENT: Negative for hearing loss and tinnitus.   Eyes: Negative for blurred vision.  Respiratory: Negative for cough and hemoptysis.   Cardiovascular: Negative for chest pain and palpitations.  Gastrointestinal: Negative for heartburn and nausea.  Genitourinary: Negative for dysuria and urgency.  Musculoskeletal:       Left hand numbness.  Involuntary movement of the mouth which is resolved.  Skin: Negative for itching and rash.  Neurological:       Left hand numbness.  Psychiatric/Behavioral: Negative for depression and hallucinations.    MEDICATIONS AT HOME:   Prior to Admission medications   Medication Sig Start Date End Date Taking? Authorizing Provider  albuterol (PROVENTIL HFA;VENTOLIN HFA) 108 (90 Base) MCG/ACT inhaler Inhale 2 puffs into the lungs every 6 (six) hours as needed for wheezing or shortness of breath. 10/08/16   Karamalegos, Devonne Doughty, DO  aspirin (ASPIRIN CHILDRENS) 81 MG chewable tablet Chew 1 tablet (81 mg  total) by mouth daily. 03/17/18   Bettey Costa, MD  atorvastatin (LIPITOR) 80 MG tablet Take 1 tablet (80 mg total) by mouth daily at 6 PM. 09/24/16   Karamalegos, Devonne Doughty, DO  clopidogrel (PLAVIX) 75 MG tablet Take 1 tablet (75 mg total) by mouth daily. 03/17/18 03/17/19  Bettey Costa, MD  colestipol (COLESTID) 1 g tablet Take 2 g by mouth 2 (two) times daily. 10/13/17   [provider]  diclofenac sodium (VOLTAREN) 1 % GEL Apply 2 g topically 4 (four) times daily. 01/11/16   Daymon Larsen, MD  DULoxetine (CYMBALTA) 60 MG capsule Take 1 capsule (60 mg total) by mouth at bedtime. 09/24/16   Karamalegos, Devonne Doughty, DO  ferrous sulfate 325 (65 FE) MG EC tablet Take 1  tablet (325 mg total) by mouth 2 (two) times daily with a meal. Patient taking differently: Take 325 mg by mouth daily.  02/21/17   Hillary Bow, MD  fluticasone (FLONASE) 50 MCG/ACT nasal spray Place 1 spray into both nostrils daily. 10/08/16   Karamalegos, Devonne Doughty, DO  gabapentin (NEURONTIN) 800 MG tablet Take 1 tablet (800 mg total) by mouth 2 (two) times daily. 09/24/16   Karamalegos, Devonne Doughty, DO  glipiZIDE-metformin (METAGLIP) 5-500 MG tablet Take 1 tablet by mouth 2 (two) times daily before a meal. Patient taking differently: Take 1-2 tablets by mouth 2 (two) times daily before a meal. 2 tablets in the morning and 1 tablet in the evening 04/02/17   Karamalegos, Devonne Doughty, DO  levETIRAcetam (KEPPRA) 500 MG tablet Take 1 tablet (500 mg total) by mouth 2 (two) times daily. 09/19/15   Epifanio Lesches, MD  levothyroxine (SYNTHROID, LEVOTHROID) 112 MCG tablet Take 1 tablet (112 mcg total) by mouth daily before breakfast. 09/24/17   Geradine Girt, DO  lisinopril (PRINIVIL,ZESTRIL) 10 MG tablet Take 2 tablets (20 mg total) by mouth daily. 03/19/18   Bettey Costa, MD  nitroGLYCERIN (NITROSTAT) 0.4 MG SL tablet Place 1 tablet (0.4 mg total) under the tongue every 5 (five) minutes as needed for chest pain. 02/28/16   Bettey Costa,  MD  omeprazole (PRILOSEC) 20 MG capsule Take 20 mg by mouth 2 (two) times daily. 09/02/17   [provider]  sucralfate (CARAFATE) 1 g tablet TAKE (1) TABLET BY MOUTH FOUR TIMES A DAY Patient taking differently: Take 1 g by mouth 4 (four) times daily.  03/26/17   Karamalegos, Devonne Doughty, DO  umeclidinium bromide (INCRUSE ELLIPTA) 62.5 MCG/INH AEPB Inhale 1 puff into the lungs daily. 11/13/15   Karamalegos, Devonne Doughty, DO  vitamin B-12 (CYANOCOBALAMIN) 500 MCG tablet Take 1 tablet by mouth daily.  06/29/17   [provider]      VITAL SIGNS:  Blood pressure (!) 118/57, pulse (!) 57, temperature 97.8 F (36.6 C), temperature source Oral, resp. rate (!) 21, height 5\' 1"  (1.549 m), weight 72.1 kg, SpO2 97 %.  PHYSICAL EXAMINATION:  Physical Exam  GENERAL:  73 y.o.-year-old patient lying in the bed with no acute distress.  EYES: Pupils equal, round, reactive to light and accommodation. No scleral icterus. Extraocular muscles intact.  HEENT: Head atraumatic, normocephalic. Oropharynx and nasopharynx clear.  NECK:  Supple, no jugular venous distention. No thyroid enlargement, no tenderness.  LUNGS: Normal breath sounds bilaterally, no wheezing, rales,rhonchi or crepitation. No use of accessory muscles of respiration.  CARDIOVASCULAR: S1, S2 normal. No murmurs, rubs, or gallops.  ABDOMEN: Soft, nontender, nondistended. Bowel sounds present. No organomegaly or mass.  EXTREMITIES: No pedal edema, cyanosis, or clubbing.  NEUROLOGIC: Cranial nerves II through XII are intact. Muscle strength 4/5 in left upper and lower extremity from prior CVA. Sensation intact. Gait not checked.  PSYCHIATRIC: The patient is alert and oriented x 3.  SKIN: No obvious rash, lesion, or ulcer.   LABORATORY PANEL:   CBC Recent Labs  Lab 06/13/18 1304  WBC 7.3  HGB 12.4  HCT 38.4  PLT 283    ------------------------------------------------------------------------------------------------------------------  Chemistries  Recent Labs  Lab 06/13/18 1304  NA 140  K 2.8*  CL 105  CO2 25  GLUCOSE 155*  BUN 9  CREATININE 0.71  CALCIUM 8.6*  AST 18  ALT 13  ALKPHOS 112  BILITOT 0.3   ------------------------------------------------------------------------------------------------------------------  Cardiac Enzymes Recent Labs  Lab 06/13/18 1304  TROPONINI <0.03   ------------------------------------------------------------------------------------------------------------------  RADIOLOGY:  Ct Head Wo Contrast  Result Date: 06/13/2018 CLINICAL DATA:  Uncontrollable mouth movements for 2 days. Difficulty opening left hand. EXAM: CT HEAD WITHOUT CONTRAST TECHNIQUE: Contiguous axial images were obtained from the base of the skull through the vertex without intravenous contrast. COMPARISON:  March 13, 2018 FINDINGS: Brain: No subdural, epidural, or subarachnoid hemorrhage. Stable left posterior parietal infarct with encephalomalacia. Lacunar infarcts in the cerebellum, involving both hemispheres. Ventricles and sulci are stable. White matter changes are stable. No acute ischemia noted. No mass effect or midline shift. Vascular: Calcified atherosclerosis in the intracranial carotids. Skull: Normal. Negative for fracture or focal lesion. Sinuses/Orbits: No acute finding. Other: None. IMPRESSION: Chronic white matter changes, lacunar infarcts, and a left posterior parietal infarct. No acute intracranial abnormalities. Electronically Signed   By: Dorise Bullion III M.D   On: 06/13/2018 13:41      IMPRESSION AND PLAN:  Patient is a 73 year old female with history of CVA with residual left-sided hemiparesis chronic diastolic CHF, COPD, hypertension, seizure disorder, hypothyroidism, CAD status post stent placement and tobacco abuse admitted for further evaluation for CVA.  1.   Acute CVA; diagnosed clinically Patient presented with left hand numbness which is still present. Also had involuntary movement of the mouth which is resolved.  Patient on aspirin and Plavix at home which she already took prior to presentation. CT scan of the head with no acute CVA. Patient does have history of CVA with residual left-sided hemiparesis at baseline. Requested for MRI MRA of the brain.  Carotid Doppler ultrasound.  2D echocardiogram.  Speech and physical therapy consult. Continued home dose of aspirin and Plavix.  Continue statins.  Lipid panel in a.m. There is documentation of atrial fibrillation previously in the chart.  However review of prior twelve-lead EKGs, I could not confirm presence of atrial fibrillation.  Requested for twelve-lead EKG now to consider getting neurology input in a.m. pending findings of imaging studies ordered above. If MRI of the brain negative, symptoms may be possibly related to hypokalemia as well.  2.  Hypokalemia with potassium of 2.8 Being replaced.  Follow-up on repeat potassium and magnesium level.  3.  Diabetes mellitus type 2. Placed on sliding scale insulin coverage.  Glycosylated hemoglobin level in a.m.  4.  Hypothyroidism. Continue Synthroid.  TSH level in the a.m.  5.  Tobacco abuse. Spent 5 minutes on smoking cessation counseling.  Patient cutting down.  Declined nicotine patch.  6.  Chronic diastolic CHF Stable  DVT prophylaxis; Lovenox   All the records are reviewed and case discussed with ED provider. Management plans discussed with the patient, and she is in agreement.  CODE STATUS: DNR/DNI. Patient awake and alert and able to make her own medical decisions.  Clearly wishes to be DNR/DNI.  TOTAL TIME TAKING CARE OF THIS PATIENT: 61 minutes.    Tommi Crepeau M.D on 06/13/2018 at 4:55 PM  Between 7am to 6pm - Pager - [phone]  After 6pm go to [email or web address] - Proofreader  [facility]   Office  [phone]  CC: Primary care physician; Tracie Harrier, MD   Note: This dictation was prepared with Dragon dictation along with smaller phrase technology. Any transcriptional errors that result from this process are unintentional.

## 2018-06-13 NOTE — ED Notes (Signed)
.. ED TO INPATIENT HANDOFF REPORT  ED Nurse Name and Phone #: Deneise Lever [phone]  S Name/Age/Gender Samantha Aguirre 73 y.o. female Room/Bed: ED13A/ED13A  Code Status   Code Status: Prior  Home/SNF/Other Home Patient oriented to: self, place, time and situation Is this baseline? Yes   Triage Complete: Triage complete  Chief Complaint Numbness  Triage Note Pt presents from home via acems with c/o "mouth moving without her doing it". Pt c/o tingling in left hand as well. hx of diabetes.    Allergies Allergies  Allergen Reactions  . Ivp Dye [Iodinated Diagnostic Agents] Itching    Pt injected with IV contrast.  5 min after injection pt complained of itching behind ear and on her abd.  . Methotrexate Rash  . Morphine And Related Other (See Comments)    "stops my breathing" NEAR-RESPIRATORY ARREST  . Mushroom Extract Complex Anaphylaxis    Made patient "deathly sick"  . Atenolol Other (See Comments)    "MD took me off of it bc it was doing something wrong" Made patient HYPOTENSIVE  . Percocet [Oxycodone-Acetaminophen] Nausea And Vomiting and Other (See Comments)    Stomach pains  . Percodan [Oxycodone-Aspirin] Nausea And Vomiting and Other (See Comments)    Stomach pains  . Tramadol Nausea Only  . Verapamil Other (See Comments)    " MD told me this was screwing me up" MAKES PATIENT HYPOTENSIVE  . Oxycodone Hcl     Level of Care/Admitting Diagnosis ED Disposition    ED Disposition Condition Comment   Admit  The patient appears reasonably stabilized for admission considering the current resources, flow, and capabilities available in the ED at this time, and I doubt any other [facility] requiring further screening and/or treatment in the ED prior to admission is  present.       B Medical/Surgery History Past Medical History:  Diagnosis Date  . Acid reflux   . Arthritis    Bilateral knees, hands, feet  . Asthma   . Atrial fibrillation ([place])   . Cancer ([place])   . CHF  (congestive heart failure) (HCC)    Diastolic CHF  . COPD (chronic obstructive pulmonary disease) ([place])   . Coronary artery disease   . Diabetes mellitus without complication ([place])   . Frequent headaches   . GI bleed   . HLD (hyperlipidemia)   . Hypertension   . Seizures ([place])   . Skin cancer 2015   Suspected basal cell carcinoma on nose, surgically removed  . Stroke ([place]) 04/2017  . Thyroid disease   . Tremors of nervous system    Past Surgical History:  Procedure Laterality Date  . ABDOMINAL HYSTERECTOMY  1976  . APPENDECTOMY  1980  . BACK SURGERY    . CAROTID ENDARTERECTOMY    . CHOLECYSTECTOMY  1980  . COLONOSCOPY N/A 02/20/2017   Procedure: COLONOSCOPY;  Surgeon: Lin Landsman, MD;  Location: [facility] ENDOSCOPY;  Service: Gastroenterology;  Laterality: N/A;  . ESOPHAGOGASTRODUODENOSCOPY N/A 02/20/2017   Procedure: ESOPHAGOGASTRODUODENOSCOPY (EGD);  Surgeon: Lin Landsman, MD;  Location: [facility] ENDOSCOPY;  Service: Gastroenterology;  Laterality: N/A;  . ESOPHAGOGASTRODUODENOSCOPY (EGD) WITH PROPOFOL N/A 07/08/2017   Procedure: ESOPHAGOGASTRODUODENOSCOPY (EGD) WITH PROPOFOL;  Surgeon: [name], MD;  Location: [facility] ENDOSCOPY;  Service: Endoscopy;  Laterality: N/A;  . [place]  . GIVENS CAPSULE STUDY N/A 07/09/2017   Procedure: GIVENS CAPSULE STUDY;  Surgeon: Jonathon Bellows, MD;  Location: [facility] ENDOSCOPY;  Service: Gastroenterology;  Laterality: N/A;  . KNEE SURGERY  left  . KYPHOPLASTY N/A 03/16/2018   Procedure: KYPHOPLASTY, L5;  Surgeon: Hessie Knows, MD;  Location: [facility] ORS;  Service: Orthopedics;  Laterality: N/A;  . ROTATOR CUFF REPAIR     right  . TONSILLECTOMY  1950     A IV Location/Drains/Wounds Patient Lines/Drains/Airways Status   Active Line/Drains/Airways    Name:   Placement date:   Placement time:   Site:   Days:   Incision (Closed) 03/16/18 Back   03/16/18    1846     89          Intake/Output Last 24 hours No intake or output  data in the 24 hours ending 06/13/18 1613  Labs/Imaging Results for orders placed or performed during the hospital encounter of 06/13/18 (from the past 48 hour(s))  Comprehensive metabolic panel     Status: Abnormal   Collection Time: 06/13/18  1:04 PM  Result Value Ref Range   Sodium 140 135 - 145 mmol/L   Potassium 2.8 (L) 3.5 - 5.1 mmol/L   Chloride 105 98 - 111 mmol/L   CO2 25 22 - 32 mmol/L   Glucose, Bld 155 (H) 70 - 99 mg/dL   BUN 9 8 - 23 mg/dL   Creatinine, Ser 0.71 0.44 - 1.00 mg/dL   Calcium 8.6 (L) 8.9 - 10.3 mg/dL   Total Protein 6.6 6.5 - 8.1 g/dL   Albumin 3.6 3.5 - 5.0 g/dL   AST 18 15 - 41 U/L   ALT 13 0 - 44 U/L   Alkaline Phosphatase 112 38 - 126 U/L   Total Bilirubin 0.3 0.3 - 1.2 mg/dL   GFR calc non Af Amer >60 >60 mL/min   GFR calc Af Amer >60 >60 mL/min   Anion gap 10 5 - 15    Comment: Performed at [facility], [address] [place], [place] [address]  CBC with Differential     Status: None   Collection Time: 06/13/18  1:04 PM  Result Value Ref Range   WBC 7.3 4.0 - 10.5 K/uL   RBC 4.26 3.87 - 5.11 MIL/uL   Hemoglobin 12.4 12.0 - 15.0 g/dL   HCT 38.4 36.0 - 46.0 %   MCV 90.1 80.0 - 100.0 fL   MCH 29.1 26.0 - 34.0 pg   MCHC 32.3 30.0 - 36.0 g/dL   RDW 14.7 11.5 - 15.5 %   Platelets 283 150 - 400 K/uL   nRBC 0.0 0.0 - 0.2 %   Neutrophils Relative % 55 %   Neutro Abs 4.0 1.7 - 7.7 K/uL   Lymphocytes Relative 33 %   Lymphs Abs 2.4 0.7 - 4.0 K/uL   Monocytes Relative 8 %   Monocytes Absolute 0.6 0.1 - 1.0 K/uL   Eosinophils Relative 3 %   Eosinophils Absolute 0.2 0.0 - 0.5 K/uL   Basophils Relative 1 %   Basophils Absolute 0.1 0.0 - 0.1 K/uL   Immature Granulocytes 0 %   Abs Immature Granulocytes 0.02 0.00 - 0.07 K/uL    Comment: Performed at [facility], [place], [place] [address]  Urinalysis, Complete w Microscopic     Status: Abnormal   Collection Time: 06/13/18  1:04 PM  Result Value Ref Range    Color, Urine YELLOW (A) YELLOW   APPearance HAZY (A) CLEAR   Specific Gravity, Urine 1.016 1.005 - 1.030   pH 6.0 5.0 - 8.0   Glucose, UA NEGATIVE NEGATIVE mg/dL   Hgb urine dipstick NEGATIVE  NEGATIVE   Bilirubin Urine NEGATIVE NEGATIVE   Ketones, ur NEGATIVE NEGATIVE mg/dL   Protein, ur NEGATIVE NEGATIVE mg/dL   Nitrite NEGATIVE NEGATIVE   Leukocytes,Ua NEGATIVE NEGATIVE   RBC / HPF 0-5 0 - 5 RBC/hpf   WBC, UA 0-5 0 - 5 WBC/hpf   Bacteria, UA RARE (A) NONE SEEN   Squamous Epithelial / LPF 6-10 0 - 5   Mucus PRESENT    Hyaline Casts, UA PRESENT     Comment: Performed at [facility], [address] [place], [place] [address]  Troponin I - Once     Status: None   Collection Time: 06/13/18  1:04 PM  Result Value Ref Range   Troponin I <0.03 <0.03 ng/mL    Comment: Performed at [facility], [address], [place], [place] [address]  SARS Coronavirus 2 (CEPHEID - Performed in [place] hospital lab), Hosp Order     Status: None   Collection Time: 06/13/18  1:04 PM  Result Value Ref Range   SARS Coronavirus 2 NEGATIVE NEGATIVE    Comment: (NOTE) If result is NEGATIVE SARS-CoV-2 target nucleic acids are NOT DETECTED. The SARS-CoV-2 RNA is generally detectable in upper and lower  respiratory specimens during the acute phase of infection. The lowest  concentration of SARS-CoV-2 viral copies this assay can detect is 250  copies / mL. A negative result does not preclude SARS-CoV-2 infection  and should not be used as the sole basis for treatment or other  patient management decisions.  A negative result may occur with  improper specimen collection / handling, submission of specimen other  than nasopharyngeal swab, presence of viral mutation(s) within the  areas targeted by this assay, and inadequate number of viral copies  (<250 copies / mL). A negative result must be combined with clinical  observations, patient history, and epidemiological information. If  result is POSITIVE SARS-CoV-2 target nucleic acids are DETECTED. The SARS-CoV-2 RNA is generally detectable in upper and lower  respiratory specimens dur ing the acute phase of infection.  Positive  results are indicative of active infection with SARS-CoV-2.  Clinical  correlation with patient history and other diagnostic information is  necessary to determine patient infection status.  Positive results do  not rule out bacterial infection or co-infection with other viruses. If result is PRESUMPTIVE POSTIVE SARS-CoV-2 nucleic acids MAY BE PRESENT.   A presumptive positive result was obtained on the submitted specimen  and confirmed on repeat testing.  While 2019 novel coronavirus  (SARS-CoV-2) nucleic acids may be present in the submitted sample  additional confirmatory testing may be necessary for epidemiological  and / or clinical management purposes  to differentiate between  SARS-CoV-2 and other Sarbecovirus currently known to infect humans.  If clinically indicated additional testing with an alternate test  methodology [phone]) is advised. The SARS-CoV-2 RNA is generally  detectable in upper and lower respiratory sp ecimens during the acute  phase of infection. The expected result is Negative. Fact Sheet for Patients:  StrictlyIdeas.no Fact Sheet for Healthcare Providers: BankingDealers.co.za This test is not yet approved or cleared by the [place] FDA and has been authorized for detection and/or diagnosis of SARS-CoV-2 by FDA under an Emergency Use Authorization (EUA).  This EUA will remain in effect (meaning this test can be used) for the duration of the COVID-19 declaration under Section 564(b)(1) of the Act, 21 U.S.C. section 360bbb-3(b)(1), unless the authorization is terminated or revoked sooner. Performed at [facility], [id number]  [address], [place], [place] [address]    Ct Head Wo Contrast  Result Date:  06/13/2018 CLINICAL DATA:  Uncontrollable mouth movements for 2 days. Difficulty opening left hand. EXAM: CT HEAD WITHOUT CONTRAST TECHNIQUE: Contiguous axial images were obtained from the base of the skull through the vertex without intravenous contrast. COMPARISON:  March 13, 2018 FINDINGS: Brain: No subdural, epidural, or subarachnoid hemorrhage. Stable left posterior parietal infarct with encephalomalacia. Lacunar infarcts in the cerebellum, involving both hemispheres. Ventricles and sulci are stable. White matter changes are stable. No acute ischemia noted. No mass effect or midline shift. Vascular: Calcified atherosclerosis in the intracranial carotids. Skull: Normal. Negative for fracture or focal lesion. Sinuses/Orbits: No acute finding. Other: None. IMPRESSION: Chronic white matter changes, lacunar infarcts, and a left posterior parietal infarct. No acute intracranial abnormalities. Electronically Signed   By: Dorise Bullion III M.D   On: 06/13/2018 13:41    Pending Labs Unresulted Labs (From admission, onward)   None      Vitals/Pain Today's Vitals   06/13/18 1400 06/13/18 1430 06/13/18 1530 06/13/18 1600  BP: 121/70 (!) 123/59 110/62 (!) 118/57  Pulse: 61 (!) 56 64 (!) 57  Resp: (!) 22 (!) 22 20 (!) 21  Temp:      TempSrc:      SpO2: 97% 97% 97% 97%  Weight:      Height:      PainSc:        Isolation Precautions No active isolations  Medications Medications  potassium chloride SA (K-DUR) CR tablet 40 mEq (40 mEq Oral Given 06/13/18 1401)    Mobility walks with device Moderate fall risk   Focused Assessments Neuro Assessment Handoff:  Swallow screen pass? Yes          Neuro Assessment: Exceptions to WDL Neuro Checks:      Last Documented NIHSS Modified Score:   Has TPA been given? No If patient is a Neuro Trauma and patient is going to OR before floor call report to [place] nurse: [phone] or [phone]     R Recommendations: See Admitting  Provider Note  Report given to:   Additional Notes:

## 2018-06-14 ENCOUNTER — Other Ambulatory Visit: Payer: Medicare Other

## 2018-06-14 DIAGNOSIS — R200 Anesthesia of skin: Secondary | ICD-10-CM

## 2018-06-14 DIAGNOSIS — R202 Paresthesia of skin: Secondary | ICD-10-CM

## 2018-06-14 LAB — CBC
HCT: 37.7 % (ref 36.0–46.0)
Hemoglobin: 11.7 g/dL — ABNORMAL LOW (ref 12.0–15.0)
MCH: 28.6 pg (ref 26.0–34.0)
MCHC: 31 g/dL (ref 30.0–36.0)
MCV: 92.2 fL (ref 80.0–100.0)
Platelets: 253 10*3/uL (ref 150–400)
RBC: 4.09 MIL/uL (ref 3.87–5.11)
RDW: 14.7 % (ref 11.5–15.5)
WBC: 6.4 10*3/uL (ref 4.0–10.5)
nRBC: 0 % (ref 0.0–0.2)

## 2018-06-14 LAB — BASIC METABOLIC PANEL
Anion gap: 7 (ref 5–15)
BUN: 10 mg/dL (ref 8–23)
CO2: 25 mmol/L (ref 22–32)
Calcium: 8.6 mg/dL — ABNORMAL LOW (ref 8.9–10.3)
Chloride: 109 mmol/L (ref 98–111)
Creatinine, Ser: 0.67 mg/dL (ref 0.44–1.00)
GFR calc Af Amer: >60 mL/min (ref >60)
GFR calc non Af Amer: >60 mL/min (ref >60)
Glucose, Bld: 115 mg/dL — ABNORMAL HIGH (ref 70–99)
Potassium: 3.5 mmol/L (ref 3.5–5.1)
Sodium: 141 mmol/L (ref 135–145)

## 2018-06-14 LAB — LIPID PANEL
Cholesterol: 188 mg/dL (ref 0–200)
HDL: 40 mg/dL — ABNORMAL LOW (ref >40)
LDL Cholesterol: 107 mg/dL — ABNORMAL HIGH (ref 0–99)
Total CHOL/HDL Ratio: 4.7 RATIO
Triglycerides: 204 mg/dL — ABNORMAL HIGH (ref <150)
VLDL: 41 mg/dL — ABNORMAL HIGH (ref 0–40)

## 2018-06-14 LAB — HEMOGLOBIN A1C
Hgb A1c MFr Bld: 5.6 % (ref 4.8–5.6)
Mean Plasma Glucose: 114.02 mg/dL

## 2018-06-14 LAB — GLUCOSE, CAPILLARY
Glucose-Capillary: 118 mg/dL — ABNORMAL HIGH (ref 70–99)
Glucose-Capillary: 120 mg/dL — ABNORMAL HIGH (ref 70–99)
Glucose-Capillary: 113 mg/dL — ABNORMAL HIGH (ref 70–99)
Glucose-Capillary: 138 mg/dL — ABNORMAL HIGH (ref 70–99)

## 2018-06-14 LAB — TSH: TSH: 7.057 u[IU]/mL — ABNORMAL HIGH (ref 0.350–4.500)

## 2018-06-14 LAB — MAGNESIUM: Magnesium: 2.2 mg/dL (ref 1.7–2.4)

## 2018-06-14 LAB — MRSA PCR SCREENING: MRSA by PCR: NEGATIVE

## 2018-06-14 MED ORDER — ENOXAPARIN SODIUM 40 MG/0.4ML ~~LOC~~ SOLN
40.0000 mg | SUBCUTANEOUS | Status: DC
  Administered 2018-06-14 – 2018-06-15 (×2): 40 mg via SUBCUTANEOUS
  Filled 2018-06-14 (×2): qty 0.4

## 2018-06-14 NOTE — Evaluation (Signed)
Occupational Therapy Evaluation Patient Details Name: Samantha Aguirre MRN: [id number] DOB: 08/05/1945 Today's Date: 06/14/2018    History of Present Illness Pt is a 73 y.o. female presenting to hospital with acute onset L hand numbness and involuntary movement of mouth.  Imaging noted remote infarcts but negative for acute intracranial abnormality.  Pt noted with hypokalemia (K+ 2.8).  PMH includes s/p kypho L5 04/12/18, h/o CVA with residual L facial droop and L UE/LE weakness, a-fib, CHF, COPD, DM, CA, htn, sz's, CEA, R RCR.   Clinical Impression   Samantha Aguirre was seen for OT evaluation this date. Prior to hospital admission, pt was generally independent in ADLs, however required assistance for IADLs and endorses multiple falls in her bathroom during bathing routine.  Pt lives alone in an apartment home with family available intermittently to assist with care.  Currently pt demonstrates impairments in functional UE use/strength, decreased L-side sensation, and decreased activity tolerance requiring min assist for ADLs. Pt educated in falls prevention strategies, cognitive behavioral approaches to pain mgt, and safe use of AE for ADLs. Pt verbalized understanding of education provided. Pt would benefit from skilled OT to address noted impairments and functional limitations (see below for any additional details) in order to maximize safety and independence while minimizing falls risk and caregiver burden.  Upon hospital discharge, recommend HHOT to address safety and functional independence within the pt's natural context.     Follow Up Recommendations  Home health OT    Equipment Recommendations  Tub/shower bench    Recommendations for Other Services       Precautions / Restrictions Precautions Precautions: Fall Precaution Comments: Seizure precautions Restrictions Weight Bearing Restrictions: No      Mobility Bed Mobility Overal bed mobility: Modified Independent              General bed mobility comments: Semi-supine to sit without any noted difficulties.  Transfers Overall transfer level: Needs assistance Equipment used: Rolling walker (2 wheeled) Transfers: Sit to/from [facility] Sit to Stand: Min guard Stand pivot transfers: Min guard       General transfer comment: Pt in room recliner at start and end of session. Per PT min guard to complete transfers. Completes bed mobility Mod I.     Balance Overall balance assessment: Needs assistance Sitting-balance support: No upper extremity supported;Feet supported Sitting balance-Leahy Scale: Good Sitting balance - Comments: steady sitting reaching within BOS   Standing balance support: No upper extremity supported Standing balance-Leahy Scale: Good Standing balance comment: steady standing reaching within BOS                           ADL either performed or assessed with clinical judgement   ADL Overall ADL's : Needs assistance/impaired Eating/Feeding: Sitting;Set up;Minimal assistance Eating/Feeding Details (indicate cue type and reason): Min assist for opening containers/supporting food reaching mouth 2/2 RUE tremor.  Grooming: Sitting;Set up Grooming Details (indicate cue type and reason): Increased time/effort to complete.  Upper Body Bathing: Sitting;Minimal assistance   Lower Body Bathing: Minimal assistance;Moderate assistance;Sit to/from stand   Upper Body Dressing : Minimal assistance;Sitting   Lower Body Dressing: Minimal assistance;Moderate assistance;Sit to/from stand   Toilet Transfer: Set up;RW;BSC;Ambulation;Cueing for safety;Cueing for sequencing   Toileting- Clothing Manipulation and Hygiene: Minimal assistance;Sit to/from stand       Functional mobility during ADLs: Rolling walker;Supervision/safety       Vision Baseline Vision/History: Wears glasses Wears Glasses: Reading only Patient Visual Report: Blurring  of vision;Diplopia Vision  Assessment?: Yes;Vision impaired- to be further tested in functional context Eye Alignment: Within Functional Limits Tracking/Visual Pursuits: Able to track stimulus in all quads without difficulty Diplopia Assessment: Present all the time/all directions Additional Comments: Pt endorses consistent blurry/double vision. Will continue to asess during functional activity. Apparent peripheral vision deficits.      Perception     Praxis      Pertinent Vitals/Pain Pain Assessment: 0-10 Pain Score: 6  Pain Location: Head, body, "just about everywhere"  Pain Descriptors / Indicators: Aching;Sore Pain Intervention(s): Limited activity within patient's tolerance;Monitored during session;Utilized relaxation techniques;Repositioned     Hand Dominance Right   Extremity/Trunk Assessment Upper Extremity Assessment Upper Extremity Assessment: Generalized weakness;RUE deficits/detail;LUE deficits/detail RUE Deficits / Details: RUE WFL, however pt noted with tremor which she states impacts function significantly. Tremor not observed consistently t/o assessment. Appeared to be more prevalent when pt focused on use of RUE. Sensation WFL  RUE Coordination: decreased fine motor LUE Deficits / Details: Pt reports decreased sensation and significant decrease in [facility]. Upon assessment light touch sensation appeared [facility] t/o left side. Strength grossly 3/5 in LUE.  LUE Coordination: decreased fine motor;decreased gross motor   Lower Extremity Assessment Lower Extremity Assessment: Defer to PT evaluation RLE Deficits / Details: strength and ROM WFL LLE Deficits / Details: hip flexion 3+/5; knee flexion/extension 4-/5; DF 4/5   Cervical / Trunk Assessment Cervical / Trunk Assessment: Normal   Communication Communication Communication: No difficulties   Cognition Arousal/Alertness: Awake/alert Behavior During Therapy: WFL for tasks assessed/performed Overall Cognitive Status: Within Functional Limits for  tasks assessed                                     General Comments       Exercises Other Exercises Other Exercises: Pt educated in falls prevention strategies, cognitive behavioral pain mgt techniques including: distraction, deep breathing, and progressive muscle relaxation, safe use of AE, options for AE to support bathing including TTB, and AE for self-feeding including adapted utensils for tremor and built-up handles. Pt would benefit from further education in AE and falls prevention to support independence and safety upon hospital DC.    Shoulder Instructions      Home Living Family/patient expects to be discharged to:: Private residence Living Arrangements: Alone Available Help at Discharge: Family;Friend(s);Available PRN/intermittently Type of Home: Apartment Home Access: Level entry     Home Layout: One level     Bathroom Shower/Tub: Teacher, early years/pre: Standard     Home Equipment: Environmental consultant - 2 wheels;Shower seat;Cane - single point;Grab bars - tub/shower;Wheelchair - power          Prior Functioning/Environment Level of Independence: Independent with assistive device(s)        Comments: Ambulatory with walker in home as needed; uses power w/c for community ambulation. Pt endorses having multiple falls in her bathroom. States that she has difficulty getting into/out of the bathtub.         OT Problem List: Decreased strength;Impaired balance (sitting and/or standing);Pain;Decreased range of motion;Decreased safety awareness;Decreased activity tolerance;Impaired UE functional use;Impaired sensation;Decreased knowledge of use of DME or AE      OT Treatment/Interventions: Self-care/ADL training;Therapeutic exercise;Modalities;Patient/family education;Therapeutic activities;Energy conservation;DME and/or AE instruction    OT Goals(Current goals can be found in the care plan section) Acute Rehab OT Goals Patient Stated Goal: To go home  and feel safer  when completing her daily routines.  OT Goal Formulation: With patient Time For Goal Achievement: 06/28/18 Potential to Achieve Goals: Good  OT Frequency: Min 1X/week   Barriers to D/C: Decreased caregiver support          Co-evaluation              AM-PAC OT "6 Clicks" Daily Activity     Outcome Measure Help from another person eating meals?: A Little Help from another person taking care of personal grooming?: A Little Help from another person toileting, which includes using toliet, bedpan, or urinal?: A Little Help from another person bathing (including washing, rinsing, drying)?: A Little Help from another person to put on and taking off regular upper body clothing?: A Little Help from another person to put on and taking off regular lower body clothing?: A Little 6 Click Score: 18   End of Session    Activity Tolerance: Patient tolerated treatment well Patient left: in chair;with call bell/phone within reach;with chair alarm set  OT Visit Diagnosis: History of falling (Z91.81);Pain;Muscle weakness (generalized) (M62.81) Pain - Right/Left: Right(Both) Pain - part of body: Shoulder;Arm(Head/neck)                Time: 8757-9728 OT Time Calculation (min): 44 min Charges:  OT General Charges $OT Visit: 1 Visit OT Evaluation $OT Eval Low Complexity: 1 Low OT Treatments $Self Care/Home Management : 23-37 mins  Shara Blazing, M.S., OTR/L Ascom: [phone] 06/14/18, 10:52 AM

## 2018-06-14 NOTE — Progress Notes (Signed)
MRI result show no acute findings. MD made aware. Neuro checks discontinued. Pt has no deficits on stroke assessment except for continued c/o numbness in her left hand and upper arm. Will continue to monitor

## 2018-06-14 NOTE — Consult Note (Signed)
Reason for Consult:Left sided weakness Referring Physician: Sudini  CC: Left sided weakness  HPI: Samantha Aguirre is an 73 y.o. female with a history of stroke with residual left sided weakness/numbness who presents reporting that on yesterday she had acute onset of left arm weakness.   Patient reports she began to have left facial weakness about 2 days prior to presentation but just ignored it.  On yesterday at about 10 am noted that she was unable to move her left hand and that it was in a fist.  Presented for evaluation when this did not improve.  Patient had associated headache as well.  Patient has had multiple similar presentations.  Work ups have been unremarkable in the past.  Was to see neurology on an outpatient basis but with COVID19  this follow up was cancelled.  Initial NIHSS of 1.    Past Medical History:  Diagnosis Date  . Acid reflux   . Arthritis    Bilateral knees, hands, feet  . Asthma   . Atrial fibrillation ([place])   . Cancer ([place])   . CHF (congestive heart failure) (H[facility])    Diastolic CHF  . COPD (chronic obstructive pulmonary disease) ([place])   . Coronary artery disease   . Diabetes mellitus without complication ([place])   . Frequent headaches   . GI bleed   . HLD (hyperlipidemia)   . Hypertension   . Seizures ([place])   . Skin cancer 2015   Suspected basal cell carcinoma on nose, surgically removed  . Stroke ([place]) 04/2017  . Thyroid disease   . Tremors of nervous system     Past Surgical History:  Procedure Laterality Date  . ABDOMINAL HYSTERECTOMY  1976  . APPENDECTOMY  1980  . BACK SURGERY    . CAROTID ENDARTERECTOMY    . CHOLECYSTECTOMY  1980  . COLONOSCOPY N/A 02/20/2017   Procedure: COLONOSCOPY;  Surgeon: Lin Landsman, MD;  Location: [facility] ENDOSCOPY;  Service: Gastroenterology;  Laterality: N/A;  . ESOPHAGOGASTRODUODENOSCOPY N/A 02/20/2017   Procedure: ESOPHAGOGASTRODUODENOSCOPY (EGD);  Surgeon: Lin Landsman, MD;  Location: [facility] ENDOSCOPY;   Service: Gastroenterology;  Laterality: N/A;  . ESOPHAGOGASTRODUODENOSCOPY (EGD) WITH PROPOFOL N/A 07/08/2017   Procedure: ESOPHAGOGASTRODUODENOSCOPY (EGD) WITH PROPOFOL;  Surgeon: [name], MD;  Location: [facility] ENDOSCOPY;  Service: Endoscopy;  Laterality: N/A;  . [place]  . GIVENS CAPSULE STUDY N/A 07/09/2017   Procedure: GIVENS CAPSULE STUDY;  Surgeon: Jonathon Bellows, MD;  Location: [facility] ENDOSCOPY;  Service: Gastroenterology;  Laterality: N/A;  . KNEE SURGERY     left  . KYPHOPLASTY N/A 03/16/2018   Procedure: KYPHOPLASTY, L5;  Surgeon: Hessie Knows, MD;  Location: [facility] ORS;  Service: Orthopedics;  Laterality: N/A;  . ROTATOR CUFF REPAIR     right  . TONSILLECTOMY  1950    Family History  Problem Relation Age of Onset  . Breast cancer Mother   . Heart disease Mother   . Stroke Mother   . Cancer Mother   . COPD Mother   . Diabetes Mother   . Heart disease Father   . Diabetes Father   . Stroke Father   . Alcohol abuse Sister   . Drug abuse Sister   . Stroke Sister   . Cancer Sister   . Mental illness Sister   . Heart disease Brother   . Arthritis Brother   . Diabetes Brother   . Heart disease Maternal Grandfather   . Heart disease Paternal Grandfather     Social  History:  reports that she has been smoking cigarettes. She has a 53.00 pack-year smoking history. She has never used smokeless tobacco. She reports that she does not drink alcohol or use drugs.  Allergies  Allergen Reactions  . Ivp Dye [Iodinated Diagnostic Agents] Itching    Pt injected with IV contrast.  5 min after injection pt complained of itching behind ear and on her abd.  . Methotrexate Rash  . Morphine And Related Other (See Comments)    "stops my breathing" NEAR-RESPIRATORY ARREST  . Mushroom Extract Complex Anaphylaxis    Made patient "deathly sick"  . Atenolol Other (See Comments)    "MD took me off of it bc it was doing something wrong" Made patient HYPOTENSIVE  . Percocet  [Oxycodone-Acetaminophen] Nausea And Vomiting and Other (See Comments)    Stomach pains  . Percodan [Oxycodone-Aspirin] Nausea And Vomiting and Other (See Comments)    Stomach pains  . Tramadol Nausea Only  . Verapamil Other (See Comments)    " MD told me this was screwing me up" MAKES PATIENT HYPOTENSIVE  . Oxycodone Hcl     Medications:  I have reviewed the patient's current medications. Scheduled: .  stroke: mapping our early stages of recovery book   Does not apply Once  . aspirin  81 mg Oral Daily  . atorvastatin  80 mg Oral q1800  . clopidogrel  75 mg Oral Daily  . colestipol  2 g Oral BID  . DULoxetine  60 mg Oral QHS  . enoxaparin (LOVENOX) injection  40 mg Subcutaneous Q24H  . ferrous sulfate  325 mg Oral Daily  . fluticasone  1 spray Each Nare Daily  . gabapentin  800 mg Oral BID  . insulin aspart  0-5 Units Subcutaneous QHS  . insulin aspart  0-9 Units Subcutaneous TID WC  . levETIRAcetam  500 mg Oral BID  . levothyroxine  112 mcg Oral QAC breakfast  . pantoprazole  40 mg Oral Daily  . sucralfate  1 g Oral QID  . umeclidinium bromide  1 puff Inhalation Daily  . vitamin B-12  500 mcg Oral Daily    ROS: History obtained from the patient  General ROS: negative for - chills, fatigue, fever, night sweats, weight gain or weight loss Psychological ROS: negative for - behavioral disorder, hallucinations, memory difficulties, mood swings or suicidal ideation Ophthalmic ROS: negative for - blurry vision, double vision, eye pain or loss of vision ENT ROS: negative for - epistaxis, nasal discharge, oral lesions, sore throat, tinnitus or vertigo Allergy and Immunology ROS: negative for - hives or itchy/watery eyes Hematological and Lymphatic ROS: negative for - bleeding problems, bruising or swollen lymph nodes Endocrine ROS: negative for - galactorrhea, hair pattern changes, polydipsia/polyuria or temperature intolerance Respiratory ROS: negative for - cough, hemoptysis,  shortness of breath or wheezing Cardiovascular ROS: negative for - chest pain, dyspnea on exertion, edema or irregular heartbeat Gastrointestinal ROS: negative for - abdominal pain, diarrhea, hematemesis, nausea/vomiting or stool incontinence Genito-Urinary ROS: negative for - dysuria, hematuria, incontinence or urinary frequency/urgency Musculoskeletal ROS: shoulder pain Neurological ROS: as noted in HPI Dermatological ROS: negative for rash and skin lesion changes  Physical Examination: Blood pressure (!) 155/62, pulse (!) 53, temperature 97.8 F (36.6 C), temperature source Oral, resp. rate 16, height 5\' 1"  (1.549 m), weight 75.1 kg, SpO2 100 %.  HEENT-  Normocephalic, no lesions, without obvious abnormality.  Normal external eye and conjunctiva.  Normal TM's bilaterally.  Normal auditory canals and external  ears. Normal external nose, mucus membranes and septum.  Normal pharynx. Cardiovascular- S1, S2 normal, pulses palpable throughout   Lungs- chest clear, no wheezing, rales, normal symmetric air entry Abdomen- soft, non-tender; bowel sounds normal; no masses,  no organomegaly Extremities- no edema Lymph-no adenopathy palpable Musculoskeletal-no joint tenderness, deformity or swelling Skin-warm and dry, no hyperpigmentation, vitiligo, or suspicious lesions  Neurological Examination   Mental Status: Alert, oriented, thought content appropriate.  Speech fluent without evidence of aphasia.  Mild dysarthria.  Able to follow 3 step commands without difficulty. Cranial Nerves: II: Discs flat bilaterally; Visual fields grossly normal, pupils equal, round, reactive to light and accommodation III,IV, VI: ptosis not present, extra-ocular motions intact bilaterally V,VII: mild left facial droop, facial light touch sensation normal bilaterally VIII: hearing normal bilaterally IX,X: gag reflex present XI: bilateral shoulder shrug XII: midline tongue extension Motor: Right : Upper extremity    5/5    Left:     Upper extremity   5-/5 with 3+/5 hand grip  Lower extremity   5/5     Lower extremity   5-/5 Tone and bulk:normal tone throughout; no atrophy noted.  Tremor noted Sensory: Pinprick and light touch decreased in the left upper and lower extremities Deep Tendon Reflexes: 2+ in the upper extremities, 1+ at the knees and absent at the ankles Plantars: Right: mute   Left: mute Cerebellar: Normal finger-to-nose and normal heel-to-shin testing bilaterally Gait: not tested due to safety concerns    Laboratory Studies:   Basic Metabolic Panel: Recent Labs  Lab 06/13/18 1304 06/13/18 1853 06/14/18 0445  NA 140  --  141  K 2.8* 3.5 3.5  CL 105  --  109  CO2 25  --  25  GLUCOSE 155*  --  115*  BUN 9  --  10  CREATININE 0.71  --  0.67  CALCIUM 8.6*  --  8.6*  MG  --  2.2 2.2    Liver Function Tests: Recent Labs  Lab 06/13/18 1304  AST 18  ALT 13  ALKPHOS 112  BILITOT 0.3  PROT 6.6  ALBUMIN 3.6   No results for input(s): LIPASE, AMYLASE in the last 168 hours. No results for input(s): AMMONIA in the last 168 hours.  CBC: Recent Labs  Lab 06/13/18 1304 06/14/18 0445  WBC 7.3 6.4  NEUTROABS 4.0  --   HGB 12.4 11.7*  HCT 38.4 37.7  MCV 90.1 92.2  PLT 283 253    Cardiac Enzymes: Recent Labs  Lab 06/13/18 1304  TROPONINI <0.03    BNP: Invalid input(s): POCBNP  CBG: Recent Labs  Lab 06/13/18 2115 06/14/18 0745  GLUCAP 131* 118*    Microbiology: Results for orders placed or performed during the hospital encounter of 06/13/18  SARS Coronavirus 2 (CEPHEID - Performed in [place] hospital lab), Hosp Order     Status: None   Collection Time: 06/13/18  1:04 PM  Result Value Ref Range Status   SARS Coronavirus 2 NEGATIVE NEGATIVE Final    Comment: (NOTE) If result is NEGATIVE SARS-CoV-2 target nucleic acids are NOT DETECTED. The SARS-CoV-2 RNA is generally detectable in upper and lower  respiratory specimens during the acute phase of  infection. The lowest  concentration of SARS-CoV-2 viral copies this assay can detect is 250  copies / mL. A negative result does not preclude SARS-CoV-2 infection  and should not be used as the sole basis for treatment or other  patient management decisions.  A negative result may occur  with  improper specimen collection / handling, submission of specimen other  than nasopharyngeal swab, presence of viral mutation(s) within the  areas targeted by this assay, and inadequate number of viral copies  (<250 copies / mL). A negative result must be combined with clinical  observations, patient history, and epidemiological information. If result is POSITIVE SARS-CoV-2 target nucleic acids are DETECTED. The SARS-CoV-2 RNA is generally detectable in upper and lower  respiratory specimens dur ing the acute phase of infection.  Positive  results are indicative of active infection with SARS-CoV-2.  Clinical  correlation with patient history and other diagnostic information is  necessary to determine patient infection status.  Positive results do  not rule out bacterial infection or co-infection with other viruses. If result is PRESUMPTIVE POSTIVE SARS-CoV-2 nucleic acids MAY BE PRESENT.   A presumptive positive result was obtained on the submitted specimen  and confirmed on repeat testing.  While 2019 novel coronavirus  (SARS-CoV-2) nucleic acids may be present in the submitted sample  additional confirmatory testing may be necessary for epidemiological  and / or clinical management purposes  to differentiate between  SARS-CoV-2 and other [place]ovirus currently known to infect humans.  If clinically indicated additional testing with an alternate test  methodology [phone]) is advised. The SARS-CoV-2 RNA is generally  detectable in upper and lower respiratory sp ecimens during the acute  phase of infection. The expected result is Negative. Fact Sheet for Patients:   StrictlyIdeas.no Fact Sheet for Healthcare Providers: BankingDealers.co.za This test is not yet approved or cleared by the [place] FDA and has been authorized for detection and/or diagnosis of SARS-CoV-2 by FDA under an Emergency Use Authorization (EUA).  This EUA will remain in effect (meaning this test can be used) for the duration of the COVID-19 declaration under Section 564(b)(1) of the Act, 21 U.S.C. section 360bbb-3(b)(1), unless the authorization is terminated or revoked sooner. Performed at [facility], [address] [place], [place] [address]     Coagulation Studies: No results for input(s): LABPROT, INR in the last 72 hours.  Urinalysis:  Recent Labs  Lab 06/13/18 1304  COLORURINE YELLOW*  LABSPEC 1.016  PHURINE 6.0  GLUCOSEU NEGATIVE  HGBUR NEGATIVE  BILIRUBINUR NEGATIVE  KETONESUR NEGATIVE  PROTEINUR NEGATIVE  NITRITE NEGATIVE  LEUKOCYTESUR NEGATIVE    Lipid Panel:     Component Value Date/Time   CHOL 188 06/14/2018 0445   TRIG 204 (H) 06/14/2018 0445   HDL 40 (L) 06/14/2018 0445   CHOLHDL 4.7 06/14/2018 0445   VLDL 41 (H) 06/14/2018 0445   LDLCALC 107 (H) 06/14/2018 0445    HgbA1C:  Lab Results  Component Value Date   HGBA1C 6.0 (H) 03/13/2018    Urine Drug Screen:      Component Value Date/Time   LABOPIA NONE DETECTED 03/13/2018 0943   LABOPIA NONE DETECTED 09/22/2017 1940   COCAINSCRNUR NONE DETECTED 03/13/2018 0943   LABBENZ NONE DETECTED 03/13/2018 0943   LABBENZ NONE DETECTED 09/22/2017 1940   AMPHETMU NONE DETECTED 03/13/2018 0943   AMPHETMU NONE DETECTED 09/22/2017 1940   THCU NONE DETECTED 03/13/2018 0943   THCU NONE DETECTED 09/22/2017 1940   LABBARB NONE DETECTED 03/13/2018 0943   LABBARB NONE DETECTED 09/22/2017 1940    Alcohol Level: No results for input(s): ETH in the last 168 hours.   Imaging: Ct Head Wo Contrast  Result Date: 06/13/2018 CLINICAL DATA:   Uncontrollable mouth movements for 2 days. Difficulty opening left hand. EXAM: CT HEAD WITHOUT CONTRAST TECHNIQUE:  Contiguous axial images were obtained from the base of the skull through the vertex without intravenous contrast. COMPARISON:  March 13, 2018 FINDINGS: Brain: No subdural, epidural, or subarachnoid hemorrhage. Stable left posterior parietal infarct with encephalomalacia. Lacunar infarcts in the cerebellum, involving both hemispheres. Ventricles and sulci are stable. White matter changes are stable. No acute ischemia noted. No mass effect or midline shift. Vascular: Calcified atherosclerosis in the intracranial carotids. Skull: Normal. Negative for fracture or focal lesion. Sinuses/Orbits: No acute finding. Other: None. IMPRESSION: Chronic white matter changes, lacunar infarcts, and a left posterior parietal infarct. No acute intracranial abnormalities. Electronically Signed   By: Dorise Bullion III M.D   On: 06/13/2018 13:41   [name]  Result Date: 06/13/2018 CLINICAL DATA:  Stroke follow-up.  Confusion EXAM: MRI HEAD WITHOUT CONTRAST MRA HEAD WITHOUT CONTRAST TECHNIQUE: Multiplanar, multiecho pulse sequences of the brain and surrounding structures were obtained without intravenous contrast. Angiographic images of the head were obtained using MRA technique without contrast. COMPARISON:  03/13/2018 FINDINGS: MRI HEAD FINDINGS Brain: No acute infarction, hemorrhage, hydrocephalus, extra-axial collection or mass lesion. Chronic ischemic injury with a remote left parietal/posterior temporal MCA branch infarct, a right corpus callosum genu infarct, and small bilateral cerebellar infarcts. Small vessel ischemic gliosis in the cerebral white matter. T2 hypointense extra-axial lesion at the high right frontal parietal vertex measuring 13 mm, calcified by CT and consistent with incidental meningioma. Diffusion signal along the remote left cerebral infarct is attributed to susceptibility  artifact from chronic blood products in this location. Vascular: Major flow voids are preserved Skull and upper cervical spine: Negative for marrow lesion Sinuses/Orbits: Negative MRA HEAD FINDINGS Small vertebrobasilar arteries in the setting of fetal type PCA. These vessels are diffusely patent. Atherosclerotic irregularity of the bilateral carotid siphons. No branch occlusion or flow limiting stenosis. Negative for aneurysm. IMPRESSION: Brain MRI: 1. No acute finding. 2. Chronic ischemic injury with remote infarcts as described above. These have not progressed in number since 03/13/2018. Intracranial MRA: No emergent finding or high-grade proximal stenosis. Electronically Signed   By: Monte Fantasia M.D.   On: 06/13/2018 18:32   [name] Head/brain RW Cm  Result Date: 06/13/2018 CLINICAL DATA:  Stroke follow-up.  Confusion EXAM: MRI HEAD WITHOUT CONTRAST MRA HEAD WITHOUT CONTRAST TECHNIQUE: Multiplanar, multiecho pulse sequences of the brain and surrounding structures were obtained without intravenous contrast. Angiographic images of the head were obtained using MRA technique without contrast. COMPARISON:  03/13/2018 FINDINGS: MRI HEAD FINDINGS Brain: No acute infarction, hemorrhage, hydrocephalus, extra-axial collection or mass lesion. Chronic ischemic injury with a remote left parietal/posterior temporal MCA branch infarct, a right corpus callosum genu infarct, and small bilateral cerebellar infarcts. Small vessel ischemic gliosis in the cerebral white matter. T2 hypointense extra-axial lesion at the high right frontal parietal vertex measuring 13 mm, calcified by CT and consistent with incidental meningioma. Diffusion signal along the remote left cerebral infarct is attributed to susceptibility artifact from chronic blood products in this location. Vascular: Major flow voids are preserved Skull and upper cervical spine: Negative for marrow lesion Sinuses/Orbits: Negative MRA HEAD FINDINGS Small  vertebrobasilar arteries in the setting of fetal type PCA. These vessels are diffusely patent. Atherosclerotic irregularity of the bilateral carotid siphons. No branch occlusion or flow limiting stenosis. Negative for aneurysm. IMPRESSION: Brain MRI: 1. No acute finding. 2. Chronic ischemic injury with remote infarcts as described above. These have not progressed in number since 03/13/2018. Intracranial MRA: No emergent finding or high-grade proximal stenosis. Electronically  Signed   By: Monte Fantasia M.D.   On: 06/13/2018 18:32     Assessment/Plan: 73 year old female with a history of stroke and atrial fibrillation, not on anticoagulation due to history of GIB, who presents with complaints of left facial droop, left hand weakness and headache.  Patient with multiple previous similar presentations.  Work ups in the past have been unremarkable.  Last work up in March.  Patient's current work up includes a MRI of the brain that has been reviewed and shows no acute infarct.   Patient on ASA at home.  Headache always accompanies event.  Possible complicated migraine.   LDL 107.  Recommendations: 1.  EEG 2.  Complete stroke work up not indicated since just performed at the beginning of March and unremarkable including an echocardiogram and carotid dopplers.    3.  Aggressive lipid management with target LDL<70. 4.  Add Plavix to ASA at 75mg  daily 5.  Agree with therapy 6.  Patient to follow up with neurology on an outpatient basis.    Alexis Goodell, MD Neurology [phone] 06/14/2018, 10:20 AM

## 2018-06-14 NOTE — Procedures (Signed)
ELECTROENCEPHALOGRAM REPORT   Patient: Samantha Aguirre       Room #: 115B-[place] EEG No. ID: [id number] Age: 73 y.o.        Sex: female Referring Physician: Sudini Report Date:  06/14/2018        Interpreting Physician: Alexis Goodell  History: Samantha Aguirre is an 73 y.o. female with recurrent episodes of left sided weakness and headache  Medications:  Lipitor, Plavix, Colestid, Cymbalta, Ferrous Sulfate, Neurontin, Insulin, Keppra, Synthroid, B12  Conditions of Recording:  This is a 21 channel routine scalp EEG performed with bipolar and monopolar montages arranged in accordance to the international 10/20 system of electrode placement. One channel was dedicated to EKG recording.  The patient is in the awake, drowsy and asleep states.  Description:  The waking background activity consists of a low voltage, symmetrical, fairly well organized, 7 Hz theta activity, seen from the parieto-occipital and posterior temporal regions.  Low voltage fast activity, poorly organized, is seen anteriorly and is at times superimposed on more posterior regions.  A mixture of theta and alpha rhythms are seen from the central and temporal regions. The patient drowses with slowing to irregular, low voltage theta and beta activity.   The patient goes in to a light sleep with symmetrical sleep spindles, vertex central sharp transients and irregular slow activity.   No epileptiform activity is noted.   Hyperventilation was not performed.  Intermittent photic stimulation was performed but failed to illicit any change in the tracing.   IMPRESSION: Normal electroencephalogram, awake, asleep and with activation procedures. There are no focal lateralizing or epileptiform features.   Alexis Goodell, MD Neurology [phone] 06/14/2018, 5:26 PM

## 2018-06-14 NOTE — Evaluation (Signed)
Physical Therapy Evaluation Patient Details Name: Samantha Aguirre MRN: [id number] DOB: Jun 23, 1945 Today's Date: 06/14/2018   History of Present Illness  Pt is a 73 y.o. female presenting to hospital with acute onset L hand numbness and involuntary movement of mouth.  Imaging noted remote infarcts but negative for acute intracranial abnormality.  Pt noted with hypokalemia (K+ 2.8).  PMH includes s/p kypho L5 04/12/18, h/o CVA with residual L facial droop and L UE/LE weakness, a-fib, CHF, COPD, DM, CA, htn, sz's, CEA, R RCR.  Clinical Impression  Prior to hospital admission, pt was modified independent with functional mobility (uses walker in home as needed; uses power w/c for community distances).  Pt lives alone in 1 level home (level entry).  Currently pt is modified independent semi-supine to sit; CGA with transfers; and CGA to SBA with ambulating 150 feet with RW.  Overall pt steady and safe with functional mobility using RW during therapy session.  L UE/LE weakness noted (pt reports baseline from previous stroke); pt also reporting still having new L hand numbness.  O2 sats 94% or greater on room air during session's activities.  Pt would benefit from skilled PT to address noted impairments and functional limitations during hospital stay. (see below for any additional details).  Pt reports she does not feel like she needs home health PT upon hospital discharge (feels good functionally).    Follow Up Recommendations No PT follow up    Equipment Recommendations  Other (comment)(pt has own walker at home already; toilet riser)    Recommendations for Other Services OT consult     Precautions / Restrictions Precautions Precautions: Fall Precaution Comments: Seizure precautions Restrictions Weight Bearing Restrictions: No      Mobility  Bed Mobility Overal bed mobility: Modified Independent             General bed mobility comments: Semi-supine to sit without any noted  difficulties.  Transfers Overall transfer level: Needs assistance Equipment used: Rolling walker (2 wheeled) Transfers: Sit to/from [facility] Sit to Stand: Min guard Stand pivot transfers: Min guard       General transfer comment: fairly strong stand from bed using RW  Ambulation/Gait Ambulation/Gait assistance: Min guard;Supervision Gait Distance (Feet): 150 Feet Assistive device: Rolling walker (2 wheeled) Gait Pattern/deviations: Step-through pattern Gait velocity: mildly decreased   General Gait Details: steady with RW; mild decreased stance time L LE  Stairs            Wheelchair Mobility    Modified Rankin (Stroke Patients Only)       Balance Overall balance assessment: Needs assistance Sitting-balance support: No upper extremity supported;Feet supported Sitting balance-Leahy Scale: Good Sitting balance - Comments: steady sitting reaching within BOS   Standing balance support: No upper extremity supported Standing balance-Leahy Scale: Good Standing balance comment: steady standing reaching within BOS                             Pertinent Vitals/Pain Pain Assessment: 0-10 Pain Score: 6  Pain Location: "bottom" Pain Descriptors / Indicators: Sore Pain Intervention(s): Limited activity within patient's tolerance;Monitored during session;Repositioned  HR 58 bpm at rest and increased to 70 bpm with activity.    Home Living Family/patient expects to be discharged to:: Private residence Living Arrangements: Alone Available Help at Discharge: Family;Friend(s);Available PRN/intermittently Type of Home: Apartment Home Access: Level entry     Home Layout: One level Home Equipment: Walker - 2 wheels;Shower seat;Cane -  single point;Grab bars - tub/shower;Wheelchair - power      Prior Function Level of Independence: Independent with assistive device(s)         Comments: Ambulatory with walker in home as needed; uses power  w/c for community ambulation.  H/o fall about 3 months ago (tripped).     Hand Dominance        Extremity/Trunk Assessment   Upper Extremity Assessment Upper Extremity Assessment: Defer to OT evaluation(L UE weaker than R UE (baseline))    Lower Extremity Assessment Lower Extremity Assessment: RLE deficits/detail;LLE deficits/detail RLE Deficits / Details: strength and ROM WFL LLE Deficits / Details: hip flexion 3+/5; knee flexion/extension 4-/5; DF 4/5    Cervical / Trunk Assessment Cervical / Trunk Assessment: Normal  Communication   Communication: No difficulties  Cognition Arousal/Alertness: Awake/alert Behavior During Therapy: WFL for tasks assessed/performed Overall Cognitive Status: Within Functional Limits for tasks assessed                                        General Comments   Nursing cleared pt for participation in physical therapy.  Pt agreeable to PT session.    Exercises     Assessment/Plan    PT Assessment Patient needs continued PT services  PT Problem List Decreased mobility;Decreased strength       PT Treatment Interventions DME instruction;Gait training;Functional mobility training;Therapeutic activities;Therapeutic exercise;Balance training;Patient/family education    PT Goals (Current goals can be found in the Care Plan section)  Acute Rehab PT Goals Patient Stated Goal: to go home PT Goal Formulation: With patient Time For Goal Achievement: 06/28/18 Potential to Achieve Goals: Good    Frequency Min 2X/week   Barriers to discharge        Co-evaluation               AM-PAC PT "6 Clicks" Mobility  Outcome Measure Help needed turning from your back to your side while in a flat bed without using bedrails?: None Help needed moving from lying on your back to sitting on the side of a flat bed without using bedrails?: None Help needed moving to and from a bed to a chair (including a wheelchair)?: A Little Help  needed standing up from a chair using your arms (e.g., wheelchair or bedside chair)?: A Little Help needed to walk in hospital room?: A Little Help needed climbing 3-5 steps with a railing? : A Lot 6 Click Score: 19    End of Session Equipment Utilized During Treatment: Gait belt Activity Tolerance: Patient tolerated treatment well Patient left: in chair;with call bell/phone within reach;with chair alarm set;Other (comment)(Neurologist present) Nurse Communication: Mobility status;Precautions PT Visit Diagnosis: Muscle weakness (generalized) (M62.81);History of falling (Z91.81)    Time: 4782-9562 PT Time Calculation (min) (ACUTE ONLY): 21 min   Charges:   PT Evaluation $PT Eval Low Complexity: 1 Low PT Treatments $Therapeutic Exercise: 8-22 mins       Leitha Bleak, PT 06/14/18, 9:51 AM [phone]

## 2018-06-15 LAB — GLUCOSE, CAPILLARY: Glucose-Capillary: 119 mg/dL — ABNORMAL HIGH (ref 70–99)

## 2018-06-15 MED ORDER — CLOPIDOGREL BISULFATE 75 MG PO TABS: 75.0000 mg | ORAL_TABLET | Freq: Every day | ORAL | 0 refills | Status: AC

## 2018-06-15 NOTE — Progress Notes (Signed)
[facility] [place] NAME: Samantha Aguirre    MR#:  [id number]  DATE OF BIRTH:  [birth year]  SUBJECTIVE:  CHIEF COMPLAINT:   Chief Complaint  Patient presents with  . Numbness   Improved left hand weakness/numbness.  Has chronic left weakness from prior CVA  REVIEW OF SYSTEMS:    Review of Systems  Constitutional: Negative for chills and fever.  HENT: Negative for sore throat.   Eyes: Negative for blurred vision, double vision and pain.  Respiratory: Negative for cough, hemoptysis, shortness of breath and wheezing.   Cardiovascular: Negative for chest pain, palpitations, orthopnea and leg swelling.  Gastrointestinal: Negative for abdominal pain, constipation, diarrhea, heartburn, nausea and vomiting.  Genitourinary: Negative for dysuria and hematuria.  Musculoskeletal: Negative for back pain and joint pain.  Skin: Negative for rash.  Neurological: Positive for focal weakness (chronic). Negative for sensory change, speech change and headaches.  Endo/Heme/Allergies: Does not bruise/bleed easily.  Psychiatric/Behavioral: Negative for depression. The patient is not nervous/anxious.     DRUG ALLERGIES:   Allergies  Allergen Reactions  . Ivp Dye [Iodinated Diagnostic Agents] Itching    Pt injected with IV contrast.  5 min after injection pt complained of itching behind ear and on her abd.  . Methotrexate Rash  . Morphine And Related Other (See Comments)    "stops my breathing" NEAR-RESPIRATORY ARREST  . Mushroom Extract Complex Anaphylaxis    Made patient "deathly sick"  . Atenolol Other (See Comments)    "MD took me off of it bc it was doing something wrong" Made patient HYPOTENSIVE  . Percocet [Oxycodone-Acetaminophen] Nausea And Vomiting and Other (See Comments)    Stomach pains  . Percodan [Oxycodone-Aspirin] Nausea And Vomiting and Other (See Comments)    Stomach pains  . Tramadol Nausea Only  . Verapamil Other (See  Comments)    " MD told me this was screwing me up" MAKES PATIENT HYPOTENSIVE  . Oxycodone Hcl     VITALS:  Blood pressure (!) 138/56, pulse (!) 52, temperature 97.8 F (36.6 C), temperature source Oral, resp. rate 20, height 5\' 1"  (1.549 m), weight 75.6 kg, SpO2 93 %.  PHYSICAL EXAMINATION:   Physical Exam  GENERAL:  73 y.o.-year-old patient lying in the bed with no acute distress.  EYES: Pupils equal, round, reactive to light and accommodation. No scleral icterus. Extraocular muscles intact.  HEENT: Head atraumatic, normocephalic. Oropharynx and nasopharynx clear.  NECK:  Supple, no jugular venous distention. No thyroid enlargement, no tenderness.  LUNGS: Normal breath sounds bilaterally, no wheezing, rales, rhonchi. No use of accessory muscles of respiration.  CARDIOVASCULAR: S1, S2 normal. No murmurs, rubs, or gallops.  ABDOMEN: Soft, nontender, nondistended. Bowel sounds present. No organomegaly or mass.  EXTREMITIES: No cyanosis, clubbing or edema b/l.    NEUROLOGIC: Cranial nerves II through XII are intact. Decreased motor strength left PSYCHIATRIC: The patient is alert and oriented x 3.  SKIN: No obvious rash, lesion, or ulcer.   LABORATORY PANEL:   CBC Recent Labs  Lab 06/14/18 0445  WBC 6.4  HGB 11.7*  HCT 37.7  PLT 253   ------------------------------------------------------------------------------------------------------------------ Chemistries  Recent Labs  Lab 06/13/18 1304  06/14/18 0445  NA 140  --  141  K 2.8*   < > 3.5  CL 105  --  109  CO2 25  --  25  GLUCOSE 155*  --  115*  BUN 9  --  10  CREATININE 0.71  --  0.67  CALCIUM 8.6*  --  8.6*  MG  --    < > 2.2  AST 18  --   --   ALT 13  --   --   ALKPHOS 112  --   --   BILITOT 0.3  --   --    < > = values in this interval not displayed.   ------------------------------------------------------------------------------------------------------------------  Cardiac Enzymes Recent Labs  Lab  06/13/18 1304  TROPONINI <0.03   ------------------------------------------------------------------------------------------------------------------  RADIOLOGY:  Ct Head Wo Contrast  Result Date: 06/13/2018 CLINICAL DATA:  Uncontrollable mouth movements for 2 days. Difficulty opening left hand. EXAM: CT HEAD WITHOUT CONTRAST TECHNIQUE: Contiguous axial images were obtained from the base of the skull through the vertex without intravenous contrast. COMPARISON:  March 13, 2018 FINDINGS: Brain: No subdural, epidural, or subarachnoid hemorrhage. Stable left posterior parietal infarct with encephalomalacia. Lacunar infarcts in the cerebellum, involving both hemispheres. Ventricles and sulci are stable. White matter changes are stable. No acute ischemia noted. No mass effect or midline shift. Vascular: Calcified atherosclerosis in the intracranial carotids. Skull: Normal. Negative for fracture or focal lesion. Sinuses/Orbits: No acute finding. Other: None. IMPRESSION: Chronic white matter changes, lacunar infarcts, and a left posterior parietal infarct. No acute intracranial abnormalities. Electronically Signed   By: Dorise Bullion III M.D   On: 06/13/2018 13:41   [name]  Result Date: 06/13/2018 CLINICAL DATA:  Stroke follow-up.  Confusion EXAM: MRI HEAD WITHOUT CONTRAST MRA HEAD WITHOUT CONTRAST TECHNIQUE: Multiplanar, multiecho pulse sequences of the brain and surrounding structures were obtained without intravenous contrast. Angiographic images of the head were obtained using MRA technique without contrast. COMPARISON:  03/13/2018 FINDINGS: MRI HEAD FINDINGS Brain: No acute infarction, hemorrhage, hydrocephalus, extra-axial collection or mass lesion. Chronic ischemic injury with a remote left parietal/posterior temporal MCA branch infarct, a right corpus callosum genu infarct, and small bilateral cerebellar infarcts. Small vessel ischemic gliosis in the cerebral white matter. T2  hypointense extra-axial lesion at the high right frontal parietal vertex measuring 13 mm, calcified by CT and consistent with incidental meningioma. Diffusion signal along the remote left cerebral infarct is attributed to susceptibility artifact from chronic blood products in this location. Vascular: Major flow voids are preserved Skull and upper cervical spine: Negative for marrow lesion Sinuses/Orbits: Negative MRA HEAD FINDINGS Small vertebrobasilar arteries in the setting of fetal type PCA. These vessels are diffusely patent. Atherosclerotic irregularity of the bilateral carotid siphons. No branch occlusion or flow limiting stenosis. Negative for aneurysm. IMPRESSION: Brain MRI: 1. No acute finding. 2. Chronic ischemic injury with remote infarcts as described above. These have not progressed in number since 03/13/2018. Intracranial MRA: No emergent finding or high-grade proximal stenosis. Electronically Signed   By: Monte Fantasia M.D.   On: 06/13/2018 18:32   [name] Head/brain EH Cm  Result Date: 06/13/2018 CLINICAL DATA:  Stroke follow-up.  Confusion EXAM: MRI HEAD WITHOUT CONTRAST MRA HEAD WITHOUT CONTRAST TECHNIQUE: Multiplanar, multiecho pulse sequences of the brain and surrounding structures were obtained without intravenous contrast. Angiographic images of the head were obtained using MRA technique without contrast. COMPARISON:  03/13/2018 FINDINGS: MRI HEAD FINDINGS Brain: No acute infarction, hemorrhage, hydrocephalus, extra-axial collection or mass lesion. Chronic ischemic injury with a remote left parietal/posterior temporal MCA branch infarct, a right corpus callosum genu infarct, and small bilateral cerebellar infarcts. Small vessel ischemic gliosis in the cerebral white matter. T2 hypointense extra-axial lesion at the high right frontal parietal vertex measuring 13 mm, calcified by  CT and consistent with incidental meningioma. Diffusion signal along the remote left cerebral infarct is  attributed to susceptibility artifact from chronic blood products in this location. Vascular: Major flow voids are preserved Skull and upper cervical spine: Negative for marrow lesion Sinuses/Orbits: Negative MRA HEAD FINDINGS Small vertebrobasilar arteries in the setting of fetal type PCA. These vessels are diffusely patent. Atherosclerotic irregularity of the bilateral carotid siphons. No branch occlusion or flow limiting stenosis. Negative for aneurysm. IMPRESSION: Brain MRI: 1. No acute finding. 2. Chronic ischemic injury with remote infarcts as described above. These have not progressed in number since 03/13/2018. Intracranial MRA: No emergent finding or high-grade proximal stenosis. Electronically Signed   By: Monte Fantasia M.D.   On: 06/13/2018 18:32     ASSESSMENT AND PLAN:   Patient is a 73 year old female with history of CVA with residual left-sided hemiparesis chronic diastolic CHF, COPD, hypertension, seizure disorder, hypothyroidism, CAD status post stent placement and tobacco abuse admitted for further evaluation for CVA.  1.  Left hand tingling/numbness MRI with NO acute CVA Appreciate neurology input. Waiting for EEG results. Migraine? Worked with PT/OT Symptoms almost resolved Due to hypokalemia?  3.  Diabetes mellitus type 2. SSI  4.  Hypothyroidism. Continue Synthroid.    5.  Tobacco abuse. counselled to quit on admission  6.  Chronic diastolic CHF Stable  DVT prophylaxis; Lovenox  All the records are reviewed and case discussed with Care Management/Social Worker Management plans discussed with the patient, family and they are in agreement.  CODE STATUS: FULL CODE   TOTAL TIME TAKING CARE OF THIS PATIENT: 35 minutes.   POSSIBLE D/C IN 1-2 DAYS, DEPENDING ON CLINICAL CONDITION.  Leia Alf Aseem Sessums M.D on 06/15/2018 at 5:46 AM  Between 7am to 6pm - Pager - [phone]  After 6pm go to [email or web address] - password [email or web address]   Office  [phone]  CC: Primary care physician; Tracie Harrier, MD  Note: This dictation was prepared with Dragon dictation along with smaller phrase technology. Any transcriptional errors that result from this process are unintentional.

## 2018-06-15 NOTE — Discharge Instructions (Signed)
Resume diet and activity as before ° ° °

## 2018-06-15 NOTE — Progress Notes (Signed)
Subjective: Patient reports that she is back to baseline.  No new neurological complaints.    Objective: Current vital signs: BP (!) 142/65 (BP Location: Left Arm)   Pulse (!) 50   Temp 97.9 F (36.6 C) (Oral)   Resp 16   Ht 5\' 1"  (1.549 m)   Wt 75.6 kg   SpO2 97%   BMI 31.48 kg/m  Vital signs in last 24 hours: Temp:  [97.6 F (36.4 C)-98.4 F (36.9 C)] 97.9 F (36.6 C) (06/02 0741) Pulse Rate:  [50-55] 50 (06/02 0741) Resp:  [16-20] 16 (06/02 0741) BP: (111-142)/(54-98) 142/65 (06/02 0741) SpO2:  [93 %-97 %] 97 % (06/02 0741) Weight:  [75.6 kg] 75.6 kg (06/02 0345)  Intake/Output from previous day: 06/01 0701 - 06/02 0700 In: 720 [P.O.:720] Out: 900 [Urine:900] Intake/Output this shift: No intake/output data recorded. Nutritional status:  Diet Order            DIET SOFT Room service appropriate? Yes; Fluid consistency: Thin  Diet effective now              Neurologic Exam: Mental Status: Alert, oriented, thought content appropriate.  Speech fluent without evidence of aphasia.  Able to follow 3 step commands without difficulty. Cranial Nerves: II: Discs flat bilaterally; Visual fields grossly normal, pupils equal, round, reactive to light and accommodation III,IV, VI: ptosis not present, extra-ocular motions intact bilaterally V,VII: smile symmetric, facial light touch sensation normal bilaterally VIII: hearing normal bilaterally IX,X: gag reflex present XI: bilateral shoulder shrug XII: midline tongue extension Motor: 5/5 in the BUE's Sensory: Pinprick and light touch intact throughout, bilaterally   Lab Results: Basic Metabolic Panel: Recent Labs  Lab 06/13/18 1304 06/13/18 1853 06/14/18 0445  NA 140  --  141  K 2.8* 3.5 3.5  CL 105  --  109  CO2 25  --  25  GLUCOSE 155*  --  115*  BUN 9  --  10  CREATININE 0.71  --  0.67  CALCIUM 8.6*  --  8.6*  MG  --  2.2 2.2    Liver Function Tests: Recent Labs  Lab 06/13/18 1304  AST 18  ALT 13   ALKPHOS 112  BILITOT 0.3  PROT 6.6  ALBUMIN 3.6   No results for input(s): LIPASE, AMYLASE in the last 168 hours. No results for input(s): AMMONIA in the last 168 hours.  CBC: Recent Labs  Lab 06/13/18 1304 06/14/18 0445  WBC 7.3 6.4  NEUTROABS 4.0  --   HGB 12.4 11.7*  HCT 38.4 37.7  MCV 90.1 92.2  PLT 283 253    Cardiac Enzymes: Recent Labs  Lab 06/13/18 1304  TROPONINI <0.03    Lipid Panel: Recent Labs  Lab 06/14/18 0445  CHOL 188  TRIG 204*  HDL 40*  CHOLHDL 4.7  VLDL 41*  LDLCALC 107*    CBG: Recent Labs  Lab 06/14/18 0745 06/14/18 1212 06/14/18 1752 06/14/18 2139 06/15/18 0744  GLUCAP 118* 120* 113* 138* 119*    Microbiology: Results for orders placed or performed during the hospital encounter of 06/13/18  SARS Coronavirus 2 (CEPHEID - Performed in [place] hospital lab), Hosp Order     Status: None   Collection Time: 06/13/18  1:04 PM  Result Value Ref Range Status   SARS Coronavirus 2 NEGATIVE NEGATIVE Final    Comment: (NOTE) If result is NEGATIVE SARS-CoV-2 target nucleic acids are NOT DETECTED. The SARS-CoV-2 RNA is generally detectable in upper and lower  respiratory  specimens during the acute phase of infection. The lowest  concentration of SARS-CoV-2 viral copies this assay can detect is 250  copies / mL. A negative result does not preclude SARS-CoV-2 infection  and should not be used as the sole basis for treatment or other  patient management decisions.  A negative result may occur with  improper specimen collection / handling, submission of specimen other  than nasopharyngeal swab, presence of viral mutation(s) within the  areas targeted by this assay, and inadequate number of viral copies  (<250 copies / mL). A negative result must be combined with clinical  observations, patient history, and epidemiological information. If result is POSITIVE SARS-CoV-2 target nucleic acids are DETECTED. The SARS-CoV-2 RNA is generally  detectable in upper and lower  respiratory specimens dur ing the acute phase of infection.  Positive  results are indicative of active infection with SARS-CoV-2.  Clinical  correlation with patient history and other diagnostic information is  necessary to determine patient infection status.  Positive results do  not rule out bacterial infection or co-infection with other viruses. If result is PRESUMPTIVE POSTIVE SARS-CoV-2 nucleic acids MAY BE PRESENT.   A presumptive positive result was obtained on the submitted specimen  and confirmed on repeat testing.  While 2019 novel coronavirus  (SARS-CoV-2) nucleic acids may be present in the submitted sample  additional confirmatory testing may be necessary for epidemiological  and / or clinical management purposes  to differentiate between  SARS-CoV-2 and other Sarbecovirus currently known to infect humans.  If clinically indicated additional testing with an alternate test  methodology [phone]) is advised. The SARS-CoV-2 RNA is generally  detectable in upper and lower respiratory sp ecimens during the acute  phase of infection. The expected result is Negative. Fact Sheet for Patients:  StrictlyIdeas.no Fact Sheet for Healthcare Providers: BankingDealers.co.za This test is not yet approved or cleared by the [place] FDA and has been authorized for detection and/or diagnosis of SARS-CoV-2 by FDA under an Emergency Use Authorization (EUA).  This EUA will remain in effect (meaning this test can be used) for the duration of the COVID-19 declaration under Section 564(b)(1) of the Act, 21 U.S.C. section 360bbb-3(b)(1), unless the authorization is terminated or revoked sooner. Performed at [facility], [address] [place], [place] [address]   MRSA PCR Screening     Status: None   Collection Time: 06/14/18 12:36 PM  Result Value Ref Range Status   MRSA by PCR NEGATIVE NEGATIVE  Final    Comment:        The GeneXpert MRSA Assay (FDA approved for NASAL specimens only), is one component of a comprehensive MRSA colonization surveillance program. It is not intended to diagnose MRSA infection nor to guide or monitor treatment for MRSA infections. Performed at [facility], [address] [place], [place] [address]     Coagulation Studies: No results for input(s): LABPROT, INR in the last 72 hours.  Imaging: Ct Head Wo Contrast  Result Date: 06/13/2018 CLINICAL DATA:  Uncontrollable mouth movements for 2 days. Difficulty opening left hand. EXAM: CT HEAD WITHOUT CONTRAST TECHNIQUE: Contiguous axial images were obtained from the base of the skull through the vertex without intravenous contrast. COMPARISON:  March 13, 2018 FINDINGS: Brain: No subdural, epidural, or subarachnoid hemorrhage. Stable left posterior parietal infarct with encephalomalacia. Lacunar infarcts in the cerebellum, involving both hemispheres. Ventricles and sulci are stable. White matter changes are stable. No acute ischemia noted. No mass effect or midline shift. Vascular:  Calcified atherosclerosis in the intracranial carotids. Skull: Normal. Negative for fracture or focal lesion. Sinuses/Orbits: No acute finding. Other: None. IMPRESSION: Chronic white matter changes, lacunar infarcts, and a left posterior parietal infarct. No acute intracranial abnormalities. Electronically Signed   By: Dorise Bullion III M.D   On: 06/13/2018 13:41   [name]  Result Date: 06/13/2018 CLINICAL DATA:  Stroke follow-up.  Confusion EXAM: MRI HEAD WITHOUT CONTRAST MRA HEAD WITHOUT CONTRAST TECHNIQUE: Multiplanar, multiecho pulse sequences of the brain and surrounding structures were obtained without intravenous contrast. Angiographic images of the head were obtained using MRA technique without contrast. COMPARISON:  03/13/2018 FINDINGS: MRI HEAD FINDINGS Brain: No acute infarction, hemorrhage,  hydrocephalus, extra-axial collection or mass lesion. Chronic ischemic injury with a remote left parietal/posterior temporal MCA branch infarct, a right corpus callosum genu infarct, and small bilateral cerebellar infarcts. Small vessel ischemic gliosis in the cerebral white matter. T2 hypointense extra-axial lesion at the high right frontal parietal vertex measuring 13 mm, calcified by CT and consistent with incidental meningioma. Diffusion signal along the remote left cerebral infarct is attributed to susceptibility artifact from chronic blood products in this location. Vascular: Major flow voids are preserved Skull and upper cervical spine: Negative for marrow lesion Sinuses/Orbits: Negative MRA HEAD FINDINGS Small vertebrobasilar arteries in the setting of fetal type PCA. These vessels are diffusely patent. Atherosclerotic irregularity of the bilateral carotid siphons. No branch occlusion or flow limiting stenosis. Negative for aneurysm. IMPRESSION: Brain MRI: 1. No acute finding. 2. Chronic ischemic injury with remote infarcts as described above. These have not progressed in number since 03/13/2018. Intracranial MRA: No emergent finding or high-grade proximal stenosis. Electronically Signed   By: Monte Fantasia M.D.   On: 06/13/2018 18:32   [name] Head/brain GU Cm  Result Date: 06/13/2018 CLINICAL DATA:  Stroke follow-up.  Confusion EXAM: MRI HEAD WITHOUT CONTRAST MRA HEAD WITHOUT CONTRAST TECHNIQUE: Multiplanar, multiecho pulse sequences of the brain and surrounding structures were obtained without intravenous contrast. Angiographic images of the head were obtained using MRA technique without contrast. COMPARISON:  03/13/2018 FINDINGS: MRI HEAD FINDINGS Brain: No acute infarction, hemorrhage, hydrocephalus, extra-axial collection or mass lesion. Chronic ischemic injury with a remote left parietal/posterior temporal MCA branch infarct, a right corpus callosum genu infarct, and small bilateral cerebellar  infarcts. Small vessel ischemic gliosis in the cerebral white matter. T2 hypointense extra-axial lesion at the high right frontal parietal vertex measuring 13 mm, calcified by CT and consistent with incidental meningioma. Diffusion signal along the remote left cerebral infarct is attributed to susceptibility artifact from chronic blood products in this location. Vascular: Major flow voids are preserved Skull and upper cervical spine: Negative for marrow lesion Sinuses/Orbits: Negative MRA HEAD FINDINGS Small vertebrobasilar arteries in the setting of fetal type PCA. These vessels are diffusely patent. Atherosclerotic irregularity of the bilateral carotid siphons. No branch occlusion or flow limiting stenosis. Negative for aneurysm. IMPRESSION: Brain MRI: 1. No acute finding. 2. Chronic ischemic injury with remote infarcts as described above. These have not progressed in number since 03/13/2018. Intracranial MRA: No emergent finding or high-grade proximal stenosis. Electronically Signed   By: Monte Fantasia M.D.   On: 06/13/2018 18:32    Medications:  I have reviewed the patient's current medications. Scheduled: .  stroke: mapping our early stages of recovery book   Does not apply Once  . aspirin  81 mg Oral Daily  . atorvastatin  80 mg Oral q1800  . clopidogrel  75 mg Oral Daily  .  colestipol  2 g Oral BID  . DULoxetine  60 mg Oral QHS  . enoxaparin (LOVENOX) injection  40 mg Subcutaneous Q24H  . ferrous sulfate  325 mg Oral Daily  . fluticasone  1 spray Each Nare Daily  . gabapentin  800 mg Oral BID  . insulin aspart  0-5 Units Subcutaneous QHS  . insulin aspart  0-9 Units Subcutaneous TID WC  . levETIRAcetam  500 mg Oral BID  . levothyroxine  112 mcg Oral QAC breakfast  . pantoprazole  40 mg Oral Daily  . sucralfate  1 g Oral QID  . umeclidinium bromide  1 puff Inhalation Daily  . vitamin B-12  500 mcg Oral Daily    Assessment/Plan: Patient presenting with left facial droop and left  hand weakness.  Symptoms now resolved.  MRI and EEG unremarkable.    Recommendations: 1.  ASA 81mg  amd Plavix 75mg  daily 2. Patient to follow up with neurology on an outpatient basis.     LOS: 2 days   Alexis Goodell, MD Neurology [phone] 06/15/2018  9:46 AM

## 2018-06-15 NOTE — Progress Notes (Addendum)
SLP Cancellation Note  Patient Details Name: Samantha Aguirre MRN: [id number] DOB: 1945/07/09   Cancelled treatment:       Reason Eval/Treat Not Completed: SLP screened, no needs identified, will sign off(chart reviewed; consulted NSG then me w/ pt in room)  Pt denied any difficulty swallowing and is currently on a regular diet; tolerates her meals and swallowing pills w/ water per NSG. Pt conversed at conversational level w/out gross, overt deficits noted; pt denied any current new speech-language deficits, but stated she has episodes when she cannot recall a word - she stated this "comes and goes" similar to her c/o Left hand weakness and headache. Pt has been seen for for similar multiple presentations per MD note. Discussed general strategies of slowing down, use strategy of object description to recall the word, and reducing stress of the moment when talking in conversation w/ others. Handouts given.   No further skilled inpt ST services indicated as pt appears at her functional baseline. Pt will f/u w/ PCP and Neurology appt to discuss this further and report any new, increased worsening of this issue and seek Outpt ST services if desired for f/u w/ education/strategies/tx. Pt agreed. NSG to reconsult if any change in status while admitted.     Orinda Kenner, MS, CCC-SLP Watson,Katherine 06/15/2018, 10:29 AM

## 2018-06-15 NOTE — TOC Transition Note (Signed)
Transition of Care [facility]) - CM/SW Discharge Note   Patient Details  Name: Samantha Aguirre MRN: [id number] Date of Birth: Dec 29, 1945  [facility] CM/SW Contact:  Elza Rafter, RN Phone Number: 06/15/2018, 9:43 AM   Clinical Narrative:   Patient is from home alone.  Discharging today.  Needs Taxi voucher; this RNCM will provide.  Current with PCP-Dr. Delton Prairie.  Obtains medications at [facility] in [place] without difficulty.  She is currently open with [facility] for home health services.  Notified Cassie with [facility] that patient will discharge today and needs home health RN, OT and aide.  Patient declines PT at this time.  Asked MD for orders.  She uses a cane, walker and electric wheelchair at home.  States she gets along fine at home by herself.  She uses [employer] for transportation.  Offered Meals on Wheels by she states she did not like the meals in the past.  No further needs identified by CM.      Final next level of care: [place] Barriers to Discharge: No Barriers Identified   Patient Goals and CMS Choice Patient states their goals for this hospitalization and ongoing recovery are:: continue with [facility] for home health. CMS Medicare.gov Compare Post Acute Care list provided to:: Patient Choice offered to / list presented to : Patient  Discharge Placement                       Discharge Plan and Services In-house Referral: [facility] Discharge Planning Services: CM Consult Post Acute Care Choice: Home Health                    HH Arranged: RN, OT, Nurse's Aide [place] Agency: [facility] Date [facility] Agency Contacted: 06/15/18 Time [place]: [phone] Representative spoke with at [place] ([place]) Interventions     Readmission Risk Interventions Readmission Risk Prevention Plan 06/15/2018  Transportation Screening Complete  PCP or Specialist Appt within 3-5 Days Complete  HRI or [place] Complete  Social Work Consult for Kinder Planning/Counseling Not Complete  SW consult not completed comments na  Palliative Care Screening Not Applicable  Medication Review Press photographer) Complete  Some recent data might be hidden

## 2018-06-15 NOTE — Progress Notes (Signed)
Discharge AVS instructions reviewed with Samantha Aguirre. She has no questions at this time and understands to add her Plavix to her medications she gets filled with her bubble packs. Taxi voucher used and discharged with taxi service [employer] via wheelchair.

## 2018-06-16 DIAGNOSIS — G4089 Other seizures: Secondary | ICD-10-CM | POA: Diagnosis not present

## 2018-06-16 DIAGNOSIS — I5032 Chronic diastolic (congestive) heart failure: Secondary | ICD-10-CM | POA: Diagnosis not present

## 2018-06-16 DIAGNOSIS — E119 Type 2 diabetes mellitus without complications: Secondary | ICD-10-CM | POA: Diagnosis not present

## 2018-06-16 DIAGNOSIS — G459 Transient cerebral ischemic attack, unspecified: Secondary | ICD-10-CM | POA: Diagnosis not present

## 2018-06-16 DIAGNOSIS — I110 Hypertensive heart disease with heart failure: Secondary | ICD-10-CM | POA: Diagnosis not present

## 2018-06-16 DIAGNOSIS — I4891 Unspecified atrial fibrillation: Secondary | ICD-10-CM | POA: Diagnosis not present

## 2018-06-16 DIAGNOSIS — J449 Chronic obstructive pulmonary disease, unspecified: Secondary | ICD-10-CM | POA: Diagnosis not present

## 2018-06-16 DIAGNOSIS — R2689 Other abnormalities of gait and mobility: Secondary | ICD-10-CM | POA: Diagnosis not present

## 2018-06-16 DIAGNOSIS — Z7984 Long term (current) use of oral hypoglycemic drugs: Secondary | ICD-10-CM | POA: Diagnosis not present

## 2018-06-16 DIAGNOSIS — Z7982 Long term (current) use of aspirin: Secondary | ICD-10-CM | POA: Diagnosis not present

## 2018-06-16 DIAGNOSIS — M6281 Muscle weakness (generalized): Secondary | ICD-10-CM | POA: Diagnosis not present

## 2018-06-16 DIAGNOSIS — Z87891 Personal history of nicotine dependence: Secondary | ICD-10-CM | POA: Diagnosis not present

## 2018-06-16 DIAGNOSIS — S32050D Wedge compression fracture of fifth lumbar vertebra, subsequent encounter for fracture with routine healing: Secondary | ICD-10-CM | POA: Diagnosis not present

## 2018-06-16 DIAGNOSIS — F17210 Nicotine dependence, cigarettes, uncomplicated: Secondary | ICD-10-CM | POA: Diagnosis not present

## 2018-06-18 NOTE — Discharge Summary (Signed)
[facility] NAME: Samantha Aguirre    MR#:  [id number]  DATE OF BIRTH:  September 27, 1945  DATE OF ADMISSION:  06/13/2018 ADMITTING PHYSICIAN: Otila Back, MD  DATE OF DISCHARGE: 06/15/2018 10:50 AM  PRIMARY CARE PHYSICIAN: Tracie Harrier, MD   ADMISSION DIAGNOSIS:  Abnormal involuntary movement [R25.9] Stroke ([place]) [I63.9] Numbness and tingling in left hand [R20.0, R20.2]  DISCHARGE DIAGNOSIS:  Active Problems:   Stroke [facility])   SECONDARY DIAGNOSIS:   Past Medical History:  Diagnosis Date  . Acid reflux   . Arthritis    Bilateral knees, hands, feet  . Asthma   . Atrial fibrillation ([place])   . Cancer ([facility])   . CHF (congestive heart failure) ([facility])    Diastolic CHF  . COPD (chronic obstructive pulmonary disease) ([place])   . Coronary artery disease   . Diabetes mellitus without complication ([place])   . Frequent headaches   . GI bleed   . HLD (hyperlipidemia)   . Hypertension   . Seizures ([place])   . Skin cancer 2015   Suspected basal cell carcinoma on nose, surgically removed  . Stroke ([place]) 04/2017  . Thyroid disease   . Tremors of nervous system      ADMITTING HISTORY  Samantha Aguirre  is a 73 y.o. female with a known history of CVA with residual left-sided hemiparesis chronic diastolic CHF, COPD, hypertension, seizure disorder, hypothyroidism, CAD status post stent placement and tobacco abuse who presented to the emergency room with complaints of left hand numbness and involuntary movement of her mouth that started at about 11 AM this morning.  No new weakness on the left side.  No chest pain.  No shortness of breath.  No fevers.  Patient was evaluated in the emergency room and CT scan of the head done without contrast was negative for any acute findings.  COVID test was also negative.  Patient already took her medications today including her aspirin prior to presentation.  Medical service called to admit patient for further  evaluation of CVA diagnosed clinically.  HOSPITAL COURSE:   Patient is a 73 year old female with history of CVA with residual left-sided hemiparesis chronic diastolic CHF, COPD, hypertension, seizure disorder, hypothyroidism, CAD status post stent placement and tobacco abuse admitted for further evaluation for CVA.  1.    Left hand numbness and tingling Patient was admitted due to concern regarding a new CVA with her history.  Patient had MRI of the brain which showed nothing acute.  Seen by neurology and suggested EEG which was negative.  Patient was continued on her home medications.  Neurology suggested adding Plavix.  Prescription given.  Outpatient neurology follow-up.  Symptoms resolved by the time of discharge.  She does have chronic left-sided weakness which is the same.  2.  Hypokalemia with potassium of 2.8 Replaced and normalized  3.  Diabetes mellitus type 2. Sliding scale insulin in the hospital.  No change to home medications  4.  Hypothyroidism. Continue Synthroid.    5.  Tobacco abuse. Counseled to quit  6.  Chronic diastolic CHF Stable  Patient discharged home in stable condition  CONSULTS OBTAINED:    DRUG ALLERGIES:   Allergies  Allergen Reactions  . Ivp Dye [Iodinated Diagnostic Agents] Itching    Pt injected with IV contrast.  5 min after injection pt complained of itching behind ear and on her abd.  . Methotrexate Rash  . Morphine And Related Other (See Comments)    "  stops my breathing" NEAR-RESPIRATORY ARREST  . Mushroom Extract Complex Anaphylaxis    Made patient "deathly sick"  . Atenolol Other (See Comments)    "MD took me off of it bc it was doing something wrong" Made patient HYPOTENSIVE  . Percocet [Oxycodone-Acetaminophen] Nausea And Vomiting and Other (See Comments)    Stomach pains  . Percodan [Oxycodone-Aspirin] Nausea And Vomiting and Other (See Comments)    Stomach pains  . Tramadol Nausea Only  . Verapamil Other (See Comments)     " MD told me this was screwing me up" MAKES PATIENT HYPOTENSIVE  . Oxycodone Hcl     DISCHARGE MEDICATIONS:   Allergies as of 06/15/2018      Reactions   Ivp Dye [iodinated Diagnostic Agents] Itching   Pt injected with IV contrast.  5 min after injection pt complained of itching behind ear and on her abd.   Methotrexate Rash   Morphine And Related Other (See Comments)   "stops my breathing" NEAR-RESPIRATORY ARREST   Mushroom Extract Complex Anaphylaxis   Made patient "deathly sick"   Atenolol Other (See Comments)   "MD took me off of it bc it was doing something wrong" Made patient HYPOTENSIVE   Percocet [oxycodone-acetaminophen] Nausea And Vomiting, Other (See Comments)   Stomach pains   Percodan [oxycodone-aspirin] Nausea And Vomiting, Other (See Comments)   Stomach pains   Tramadol Nausea Only   Verapamil Other (See Comments)   " MD told me this was screwing me up" MAKES PATIENT HYPOTENSIVE   Oxycodone Hcl       Medication List    TAKE these medications   albuterol 108 (90 Base) MCG/ACT inhaler Commonly known as:  VENTOLIN HFA Inhale 2 puffs into the lungs every 6 (six) hours as needed for wheezing or shortness of breath.   aspirin 81 MG chewable tablet Commonly known as:  Aspirin Childrens Chew 1 tablet (81 mg total) by mouth daily.   atorvastatin 80 MG tablet Commonly known as:  LIPITOR Take 1 tablet (80 mg total) by mouth daily at 6 PM.   clopidogrel 75 MG tablet Commonly known as:  Plavix Take 1 tablet (75 mg total) by mouth daily for 30 days.   colestipol 1 g tablet Commonly known as:  COLESTID Take 2 g by mouth 2 (two) times daily.   diclofenac sodium 1 % Gel Commonly known as:  VOLTAREN Apply 2 g topically 4 (four) times daily.   DULoxetine 60 MG capsule Commonly known as:  CYMBALTA Take 1 capsule (60 mg total) by mouth at bedtime.   ferrous sulfate 325 (65 FE) MG EC tablet Take 1 tablet (325 mg total) by mouth 2 (two) times daily with a  meal. What changed:  when to take this   fluticasone 50 MCG/ACT nasal spray Commonly known as:  FLONASE Place 1 spray into both nostrils daily.   gabapentin 800 MG tablet Commonly known as:  NEURONTIN Take 1 tablet (800 mg total) by mouth 2 (two) times daily.   glipiZIDE-metformin 5-500 MG tablet Commonly known as:  METAGLIP Take 1 tablet by mouth 2 (two) times daily before a meal. What changed:    how much to take  additional instructions   levETIRAcetam 500 MG tablet Commonly known as:  KEPPRA Take 1 tablet (500 mg total) by mouth 2 (two) times daily.   levothyroxine 112 MCG tablet Commonly known as:  SYNTHROID Take 1 tablet (112 mcg total) by mouth daily before breakfast.   lisinopril 10 MG tablet  Commonly known as:  ZESTRIL Take 2 tablets (20 mg total) by mouth daily.   nitroGLYCERIN 0.4 MG SL tablet Commonly known as:  NITROSTAT Place 1 tablet (0.4 mg total) under the tongue every 5 (five) minutes as needed for chest pain.   omeprazole 20 MG capsule Commonly known as:  PRILOSEC Take 20 mg by mouth 2 (two) times daily.   sucralfate 1 g tablet Commonly known as:  CARAFATE TAKE (1) TABLET BY MOUTH FOUR TIMES A DAY What changed:  See the new instructions.   umeclidinium bromide 62.5 MCG/INH Aepb Commonly known as:  Incruse Ellipta Inhale 1 puff into the lungs daily.   vitamin B-12 500 MCG tablet Commonly known as:  CYANOCOBALAMIN Take 1 tablet by mouth daily.       Today   VITAL SIGNS:  Blood pressure (!) 142/65, pulse (!) 50, temperature 97.9 F (36.6 C), temperature source Oral, resp. rate 16, height 5\' 1"  (1.549 m), weight 75.6 kg, SpO2 97 %.  I/O:  No intake or output data in the 24 hours ending 06/18/18 1822  PHYSICAL EXAMINATION:  Physical Exam  GENERAL:  73 y.o.-year-old patient lying in the bed with no acute distress.  LUNGS: Normal breath sounds bilaterally, no wheezing, rales,rhonchi or crepitation. No use of accessory muscles of  respiration.  CARDIOVASCULAR: S1, S2 normal. No murmurs, rubs, or gallops.  ABDOMEN: Soft, non-tender, non-distended. Bowel sounds present. No organomegaly or mass.  NEUROLOGIC: Moves all 4 extremities.  Left-sided weakness PSYCHIATRIC: The patient is alert and oriented x 3.  SKIN: No obvious rash, lesion, or ulcer.   DATA REVIEW:   CBC Recent Labs  Lab 06/14/18 0445  WBC 6.4  HGB 11.7*  HCT 37.7  PLT 253    Chemistries  Recent Labs  Lab 06/13/18 1304  06/14/18 0445  NA 140  --  141  K 2.8*   < > 3.5  CL 105  --  109  CO2 25  --  25  GLUCOSE 155*  --  115*  BUN 9  --  10  CREATININE 0.71  --  0.67  CALCIUM 8.6*  --  8.6*  MG  --    < > 2.2  AST 18  --   --   ALT 13  --   --   ALKPHOS 112  --   --   BILITOT 0.3  --   --    < > = values in this interval not displayed.    Cardiac Enzymes Recent Labs  Lab 06/13/18 1304  TROPONINI <0.03    Microbiology Results  Results for orders placed or performed during the hospital encounter of 06/13/18  SARS Coronavirus 2 (CEPHEID - Performed in [facility] hospital lab), Hosp Order     Status: None   Collection Time: 06/13/18  1:04 PM  Result Value Ref Range Status   SARS Coronavirus 2 NEGATIVE NEGATIVE Final    Comment: (NOTE) If result is NEGATIVE SARS-CoV-2 target nucleic acids are NOT DETECTED. The SARS-CoV-2 RNA is generally detectable in upper and lower  respiratory specimens during the acute phase of infection. The lowest  concentration of SARS-CoV-2 viral copies this assay can detect is 250  copies / mL. A negative result does not preclude SARS-CoV-2 infection  and should not be used as the sole basis for treatment or other  patient management decisions.  A negative result may occur with  improper specimen collection / handling, submission of specimen other  than nasopharyngeal swab, presence  of viral mutation(s) within the  areas targeted by this assay, and inadequate number of viral copies  (<250 copies /  mL). A negative result must be combined with clinical  observations, patient history, and epidemiological information. If result is POSITIVE SARS-CoV-2 target nucleic acids are DETECTED. The SARS-CoV-2 RNA is generally detectable in upper and lower  respiratory specimens dur ing the acute phase of infection.  Positive  results are indicative of active infection with SARS-CoV-2.  Clinical  correlation with patient history and other diagnostic information is  necessary to determine patient infection status.  Positive results do  not rule out bacterial infection or co-infection with other viruses. If result is PRESUMPTIVE POSTIVE SARS-CoV-2 nucleic acids MAY BE PRESENT.   A presumptive positive result was obtained on the submitted specimen  and confirmed on repeat testing.  While 2019 novel coronavirus  (SARS-CoV-2) nucleic acids may be present in the submitted sample  additional confirmatory testing may be necessary for epidemiological  and / or clinical management purposes  to differentiate between  SARS-CoV-2 and other Sarbecovirus currently known to infect humans.  If clinically indicated additional testing with an alternate test  methodology [phone]) is advised. The SARS-CoV-2 RNA is generally  detectable in upper and lower respiratory sp ecimens during the acute  phase of infection. The expected result is Negative. Fact Sheet for Patients:  StrictlyIdeas.no Fact Sheet for Healthcare Providers: BankingDealers.co.za This test is not yet approved or cleared by the [place] FDA and has been authorized for detection and/or diagnosis of SARS-CoV-2 by FDA under an Emergency Use Authorization (EUA).  This EUA will remain in effect (meaning this test can be used) for the duration of the COVID-19 declaration under Section 564(b)(1) of the Act, 21 U.S.C. section 360bbb-3(b)(1), unless the authorization is terminated or revoked  sooner. Performed at [facility], [address] [place], [place] [address]   MRSA PCR Screening     Status: None   Collection Time: 06/14/18 12:36 PM  Result Value Ref Range Status   MRSA by PCR NEGATIVE NEGATIVE Final    Comment:        The GeneXpert MRSA Assay (FDA approved for NASAL specimens only), is one component of a comprehensive MRSA colonization surveillance program. It is not intended to diagnose MRSA infection nor to guide or monitor treatment for MRSA infections. Performed at [facility], [address], [place], [place] [address]     RADIOLOGY:  No results found.  Follow up with PCP in 1 week.  Management plans discussed with the patient, family and they are in agreement.  CODE STATUS:  Code Status History    Date Active Date Inactive Code Status Order ID Comments User Context   06/13/2018 1650 06/15/2018 1430 DNR [id number]  Otila Back, MD ED   03/19/2018 0040 03/19/2018 2120 DNR [id number]  Sela Hua, MD Inpatient   03/13/2018 1042 03/17/2018 2043 DNR [id number]  Loletha Grayer, MD ED   10/20/2017 1704 10/21/2017 2006 DNR [id number]  Epifanio Lesches, MD ED   09/22/2017 2207 09/24/2017 1732 DNR [id number]  Rise Patience, MD ED   07/30/2017 1420 08/03/2017 1655 DNR [id number]  Karmen Bongo, MD ED   07/06/2017 1313 07/11/2017 2202 DNR [id number]  Fritzi Mandes, MD Inpatient   04/03/2017 2010 04/08/2017 2102 DNR [id number]  Demetrios Loll, MD Inpatient   03/27/2017 1519 04/02/2017 1517 Full Code [id number]  Saundra Shelling, MD Inpatient   02/18/2017 1623 02/21/2017 1919 Full Code [id number]  Salary, Avel Peace, MD  Inpatient   10/03/2016 1841 10/04/2016 1739 Full Code [id number]  Demetrios Loll, MD Inpatient   02/27/2016 1744 02/28/2016 2034 Full Code [id number]  Vaughan Basta, MD Inpatient   02/19/2016 1553 02/21/2016 2041 Full Code [id number]  Gladstone Lighter, MD Inpatient   12/27/2015 1309 01/01/2016 1833 DNR [id number]  Loletha Grayer, MD ED   09/16/2015  1852 09/19/2015 1929 Full Code [id number]  Baxter Hire, MD Inpatient   08/16/2015 1714 08/19/2015 1735 DNR [id number]  Willia Craze, NP Inpatient    Questions for Most Recent Historical Code Status (Order [id number])    Question Answer Comment   In the event of cardiac or respiratory ARREST Do not call a "code blue"    In the event of cardiac or respiratory ARREST Do not perform Intubation, CPR, defibrillation or ACLS    In the event of cardiac or respiratory ARREST Use medication by any route, position, wound care, and other measures to relive pain and suffering. May use oxygen, suction and manual treatment of airway obstruction as needed for comfort.    Comments nurse may pronounce.  Patient more awake and alert and able to make her own medical decisions.  Clearly wishes to be DNR.       TOTAL TIME TAKING CARE OF THIS PATIENT ON DAY OF DISCHARGE: more than 30 minutes.   Leia Alf Caelum Federici M.D on 06/18/2018 at 6:22 PM  Between 7am to 6pm - Pager - [phone]  After 6pm go to [email or web address] - password [email or web address] [facility]  Office  [phone]  CC: Primary care physician; Tracie Harrier, MD  Note: This dictation was prepared with Dragon dictation along with smaller phrase technology. Any transcriptional errors that result from this process are unintentional.

## 2018-06-22 ENCOUNTER — Encounter: Payer: Self-pay | Admitting: *Deleted

## 2018-06-22 ENCOUNTER — Other Ambulatory Visit: Payer: Self-pay

## 2018-06-22 ENCOUNTER — Emergency Department
Admission: EM | Admit: 2018-06-22 | Discharge: 2018-06-22 | Disposition: A | Discharge status: Home / Self Care | Payer: Medicare Other | Attending: Emergency Medicine | Admitting: Emergency Medicine

## 2018-06-22 DIAGNOSIS — Z8673 Personal history of transient ischemic attack (TIA), and cerebral infarction without residual deficits: Secondary | ICD-10-CM | POA: Diagnosis not present

## 2018-06-22 DIAGNOSIS — F17210 Nicotine dependence, cigarettes, uncomplicated: Secondary | ICD-10-CM | POA: Insufficient documentation

## 2018-06-22 DIAGNOSIS — J449 Chronic obstructive pulmonary disease, unspecified: Secondary | ICD-10-CM | POA: Diagnosis not present

## 2018-06-22 DIAGNOSIS — Z85828 Personal history of other malignant neoplasm of skin: Secondary | ICD-10-CM | POA: Insufficient documentation

## 2018-06-22 DIAGNOSIS — I5030 Unspecified diastolic (congestive) heart failure: Secondary | ICD-10-CM | POA: Insufficient documentation

## 2018-06-22 DIAGNOSIS — E876 Hypokalemia: Secondary | ICD-10-CM | POA: Insufficient documentation

## 2018-06-22 DIAGNOSIS — J45909 Unspecified asthma, uncomplicated: Secondary | ICD-10-CM | POA: Insufficient documentation

## 2018-06-22 DIAGNOSIS — E038 Other specified hypothyroidism: Secondary | ICD-10-CM | POA: Insufficient documentation

## 2018-06-22 DIAGNOSIS — R531 Weakness: Secondary | ICD-10-CM

## 2018-06-22 DIAGNOSIS — Z9049 Acquired absence of other specified parts of digestive tract: Secondary | ICD-10-CM | POA: Diagnosis not present

## 2018-06-22 DIAGNOSIS — E119 Type 2 diabetes mellitus without complications: Secondary | ICD-10-CM | POA: Diagnosis not present

## 2018-06-22 DIAGNOSIS — I110 Hypertensive heart disease with heart failure: Secondary | ICD-10-CM | POA: Diagnosis not present

## 2018-06-22 DIAGNOSIS — I2510 Atherosclerotic heart disease of native coronary artery without angina pectoris: Secondary | ICD-10-CM | POA: Insufficient documentation

## 2018-06-22 LAB — COMPREHENSIVE METABOLIC PANEL
ALT: 13 U/L (ref 0–44)
AST: 18 U/L (ref 15–41)
Albumin: 3.6 g/dL (ref 3.5–5.0)
Alkaline Phosphatase: 121 U/L (ref 38–126)
Anion gap: 10 (ref 5–15)
BUN: 8 mg/dL (ref 8–23)
CO2: 24 mmol/L (ref 22–32)
Calcium: 8.7 mg/dL — ABNORMAL LOW (ref 8.9–10.3)
Chloride: 106 mmol/L (ref 98–111)
Creatinine, Ser: 0.62 mg/dL (ref 0.44–1.00)
GFR calc Af Amer: >60 mL/min (ref >60)
GFR calc non Af Amer: >60 mL/min (ref >60)
Glucose, Bld: 149 mg/dL — ABNORMAL HIGH (ref 70–99)
Potassium: 2.7 mmol/L — CL (ref 3.5–5.1)
Sodium: 140 mmol/L (ref 135–145)
Total Bilirubin: 0.4 mg/dL (ref 0.3–1.2)
Total Protein: 6.8 g/dL (ref 6.5–8.1)

## 2018-06-22 LAB — CBC WITH DIFFERENTIAL/PLATELET
Abs Immature Granulocytes: 0.02 10*3/uL (ref 0.00–0.07)
Basophils Absolute: 0.1 10*3/uL (ref 0.0–0.1)
Basophils Relative: 1 %
Eosinophils Absolute: 0.2 10*3/uL (ref 0.0–0.5)
Eosinophils Relative: 3 %
HCT: 37.3 % (ref 36.0–46.0)
Hemoglobin: 12.1 g/dL (ref 12.0–15.0)
Immature Granulocytes: 0 %
Lymphocytes Relative: 33 %
Lymphs Abs: 2.4 10*3/uL (ref 0.7–4.0)
MCH: 29.2 pg (ref 26.0–34.0)
MCHC: 32.4 g/dL (ref 30.0–36.0)
MCV: 89.9 fL (ref 80.0–100.0)
Monocytes Absolute: 0.7 10*3/uL (ref 0.1–1.0)
Monocytes Relative: 9 %
Neutro Abs: 4 10*3/uL (ref 1.7–7.7)
Neutrophils Relative %: 54 %
Platelets: 264 10*3/uL (ref 150–400)
RBC: 4.15 MIL/uL (ref 3.87–5.11)
RDW: 14.6 % (ref 11.5–15.5)
WBC: 7.3 10*3/uL (ref 4.0–10.5)
nRBC: 0 % (ref 0.0–0.2)

## 2018-06-22 LAB — URINALYSIS, COMPLETE (UACMP) WITH MICROSCOPIC
Bilirubin Urine: NEGATIVE
Glucose, UA: NEGATIVE mg/dL
Hgb urine dipstick: NEGATIVE
Ketones, ur: NEGATIVE mg/dL
Leukocytes,Ua: NEGATIVE
Nitrite: NEGATIVE
Protein, ur: NEGATIVE mg/dL
Specific Gravity, Urine: 1.008 (ref 1.005–1.030)
pH: 5 (ref 5.0–8.0)

## 2018-06-22 LAB — TROPONIN I: Troponin I: <0.03 ng/mL (ref <0.03)

## 2018-06-22 MED ORDER — POTASSIUM CHLORIDE ER 10 MEQ PO TBCR: 10.0000 meq | EXTENDED_RELEASE_TABLET | Freq: Every day | ORAL | 0 refills | Status: DC

## 2018-06-22 MED ORDER — POTASSIUM CHLORIDE CRYS ER 20 MEQ PO TBCR
60.0000 meq | EXTENDED_RELEASE_TABLET | Freq: Once | ORAL | Status: AC
  Administered 2018-06-22: 60 meq via ORAL
  Filled 2018-06-22: qty 3

## 2018-06-22 MED ORDER — BENZTROPINE MESYLATE 1 MG/ML IJ SOLN
1.0000 mg | Freq: Once | INTRAMUSCULAR | Status: AC
  Administered 2018-06-22: 1 mg via INTRAVENOUS
  Filled 2018-06-22: qty 1

## 2018-06-22 NOTE — ED Notes (Signed)
Patient is using hospital phone to call daughter to pick her up.

## 2018-06-22 NOTE — ED Notes (Addendum)
Patient does have occasional fine tremors in BUE. Dr. Jimmye Norman aware.

## 2018-06-22 NOTE — ED Provider Notes (Signed)
[facility] Emergency Department Provider Note       Time seen: ----------------------------------------- 11:21 AM on 06/22/2018 -----------------------------------------   I have reviewed the triage vital signs and the nursing notes.  HISTORY   Chief Complaint Weakness   HPI Samantha Aguirre is a 73 y.o. female with a history of GERD, asthma, atrial fibrillation, cancer, CHF, COPD, diabetes, hyperlipidemia, hypertension who presents to the ED for weakness and unsteadiness on her feet.  Patient states she did not feel well balance this morning.  She was recently admitted into the hospital for similar.  She typically walks with a walker.  She has not eaten yet this morning.  She denies any recent illness.  Past Medical History:  Diagnosis Date  . Acid reflux   . Arthritis    Bilateral knees, hands, feet  . Asthma   . Atrial fibrillation ([place])   . Cancer ([place])   . CHF (congestive heart failure) (HCC)    Diastolic CHF  . COPD (chronic obstructive pulmonary disease) ([place])   . Coronary artery disease   . Diabetes mellitus without complication ([place])   . Frequent headaches   . GI bleed   . HLD (hyperlipidemia)   . Hypertension   . Seizures ([place])   . Skin cancer 2015   Suspected basal cell carcinoma on nose, surgically removed  . Stroke ([place]) 04/2017  . Thyroid disease   . Tremors of nervous system     Patient Active Problem List   Diagnosis Date Noted  . Stroke ([place]) 06/13/2018  . Hypotension 03/18/2018  . Malnutrition of moderate degree 03/16/2018  . Closed fracture of lumbar vertebral body ([place]) 03/15/2018  . Speaking difficulty 03/13/2018  . TIA (transient ischemic attack) 09/22/2017  . PAF (paroxysmal atrial fibrillation) ([place])   . Acute CVA (cerebrovascular accident) ([place]) 07/30/2017  . GI bleed 07/08/2017  . Anemia 07/08/2017  . Angiodysplasia of stomach and duodenum with hemorrhage   . CVA (cerebral vascular accident) ([place]) 07/06/2017   . Stroke (cerebrum) ([place])   . Goals of care, counseling/discussion   . Palliative care encounter   . COPD exacerbation ([place]) 04/03/2017  . Flu 03/27/2017  . Iron deficiency anemia due to chronic blood loss   . Migraine 02/13/2017  . Cataracts, bilateral 01/15/2017  . Chronic venous insufficiency 10/06/2016  . Recurrent major depressive disorder, in partial remission ([place]) 10/06/2016  . Left-sided weakness 10/03/2016  . Chest pain 02/27/2016  . History of stroke 02/19/2016  . Thumb pain, left 01/22/2016  . Traumatic closed nondisplaced fracture of base of metacarpal bone of left thumb 01/22/2016  . Colon cancer screening 11/27/2015  . Depression 11/14/2015  . Coronary artery disease 11/13/2015  . Hypertension 11/13/2015  . History of skin cancer 11/13/2015  . Osteoporosis 11/13/2015  . Chronic low back pain 11/13/2015  . Left medial knee pain 11/13/2015  . Osteoarthritis of multiple joints 11/13/2015  . Other specified hypothyroidism 08/16/2015  . Controlled type 2 diabetes mellitus with diabetic neuropathy ([place]) 08/16/2015  . COPD (chronic obstructive pulmonary disease) ([place]) 08/16/2015  . Tobacco abuse 08/16/2015  . HLD (hyperlipidemia) 08/16/2015  . History of CHF (congestive heart failure) 08/16/2015  . Seizure disorder [facility])     Past Surgical History:  Procedure Laterality Date  . ABDOMINAL HYSTERECTOMY  1976  . APPENDECTOMY  1980  . BACK SURGERY    . CAROTID ENDARTERECTOMY    . CHOLECYSTECTOMY  1980  . COLONOSCOPY N/A 02/20/2017   Procedure: COLONOSCOPY;  Surgeon: Sherri Sear  Reece Levy, MD;  Location: [place];  Service: Gastroenterology;  Laterality: N/A;  . ESOPHAGOGASTRODUODENOSCOPY N/A 02/20/2017   Procedure: ESOPHAGOGASTRODUODENOSCOPY (EGD);  Surgeon: Lin Landsman, MD;  Location: [facility] ENDOSCOPY;  Service: Gastroenterology;  Laterality: N/A;  . ESOPHAGOGASTRODUODENOSCOPY (EGD) WITH PROPOFOL N/A 07/08/2017   Procedure: ESOPHAGOGASTRODUODENOSCOPY (EGD) WITH  PROPOFOL;  Surgeon: [name], MD;  Location: [facility] ENDOSCOPY;  Service: Endoscopy;  Laterality: N/A;  . [place]  . GIVENS CAPSULE STUDY N/A 07/09/2017   Procedure: GIVENS CAPSULE STUDY;  Surgeon: Jonathon Bellows, MD;  Location: [facility] ENDOSCOPY;  Service: Gastroenterology;  Laterality: N/A;  . KNEE SURGERY     left  . KYPHOPLASTY N/A 03/16/2018   Procedure: KYPHOPLASTY, L5;  Surgeon: Hessie Knows, MD;  Location: [facility] ORS;  Service: Orthopedics;  Laterality: N/A;  . ROTATOR CUFF REPAIR     right  . TONSILLECTOMY  1950    Allergies Ivp dye [iodinated diagnostic agents]; Methotrexate; Morphine and related; Mushroom extract complex; Atenolol; Percocet [oxycodone-acetaminophen]; Percodan [oxycodone-aspirin]; Tramadol; Verapamil; and Oxycodone hcl  Social History Social History   Tobacco Use  . Smoking status: Current Every Day Smoker    Packs/day: 1.00    Years: 53.00    Pack years: 53.00    Types: Cigarettes  . Smokeless tobacco: Never Used  Substance Use Topics  . Alcohol use: No  . Drug use: No   Review of Systems Constitutional: Negative for fever. Cardiovascular: Negative for chest pain. Respiratory: Positive for chronic shortness of breath Gastrointestinal: Negative for abdominal pain, vomiting and diarrhea. Musculoskeletal: Negative for back pain. Skin: Negative for rash. Neurological: Positive for weakness and balance disturbance  All systems negative/normal/unremarkable except as stated in the HPI  ____________________________________________   PHYSICAL EXAM:  VITAL SIGNS: ED Triage Vitals  Enc Vitals Group     BP      Pulse      Resp      Temp      Temp src      SpO2      Weight      Height      Head Circumference      Peak Flow      Pain Score      Pain Loc      Pain Edu?      Excl. in [place]?    Constitutional: Alert and oriented. Well appearing and in no distress. Eyes: Conjunctivae are normal. Normal extraocular movements. ENT       Head: Normocephalic and atraumatic.      Nose: No congestion/rhinnorhea.      Mouth/Throat: Mucous membranes are moist.      Neck: No stridor. Cardiovascular: Normal rate, regular rhythm. No murmurs, rubs, or gallops. Respiratory: Normal respiratory effort without tachypnea nor retractions. Breath sounds are clear and equal bilaterally. No wheezes/rales/rhonchi. Gastrointestinal: Soft and nontender. Normal bowel sounds Musculoskeletal: Nontender with normal range of motion in extremities. No lower extremity tenderness nor edema. Neurologic:  Normal speech and language. No gross focal neurologic deficits are appreciated.  Tremor is noted Skin:  Skin is warm, dry and intact. No rash noted. Psychiatric: Mood and affect are normal. Speech and behavior are normal.  ____________________________________________  EKG: Interpreted by me.  Sinus rhythm with a rate of 60 bpm, left anterior fascicular block, low voltage, possible old septal infarct, normal QT  ____________________________________________  ED COURSE:  As part of my medical decision making, I reviewed the following data within the [place] History obtained from family if  available, nursing notes, old chart and ekg, as well as notes from prior ED visits. Patient presented for weakness and ataxia, we will assess with labs and imaging as indicated at this time.   Procedures  Avarose Mervine was evaluated in Emergency Department on 06/22/2018 for the symptoms described in the history of present illness. She was evaluated in the context of the global COVID-19 pandemic, which necessitated consideration that the patient might be at risk for infection with the SARS-CoV-2 virus that causes COVID-19. Institutional protocols and algorithms that pertain to the evaluation of patients at risk for COVID-19 are in a state of rapid change based on information released by regulatory bodies including the CDC and federal and state  organizations. These policies and algorithms were followed during the patient's care in the ED.  ____________________________________________   LABS (pertinent positives/negatives)  Labs Reviewed  COMPREHENSIVE METABOLIC PANEL - Abnormal; Notable for the following components:      Result Value   Potassium 2.7 (*)    Glucose, Bld 149 (*)    Calcium 8.7 (*)    All other components within normal limits  URINALYSIS, COMPLETE (UACMP) WITH MICROSCOPIC - Abnormal; Notable for the following components:   Color, Urine YELLOW (*)    APPearance CLEAR (*)    Bacteria, UA RARE (*)    All other components within normal limits  CBC WITH DIFFERENTIAL/PLATELET  TROPONIN I  CBG MONITORING, ED  ___________________________________________   DIFFERENTIAL DIAGNOSIS   Weakness, dehydration, electrolyte abnormality, occult infection  FINAL ASSESSMENT AND PLAN  Weakness, mild hypokalemia   Plan: The patient had presented for continued weakness. Patient's labs did reveal mild hypokalemia which we repleted orally.  She did not require any repeat imaging during this visit.  I did offer skilled nursing facility placement but she has declined.  She is cleared for outpatient follow-up.   Laurence Aly, MD    Note: This note was generated in part or whole with voice recognition software. Voice recognition is usually quite accurate but there are transcription errors that can and very often do occur. I apologize for any typographical errors that were not detected and corrected.     Earleen Newport, MD 06/22/18 [phone]

## 2018-06-22 NOTE — ED Notes (Signed)
Patient states she is not as "shaky" after receiving the cogentin.

## 2018-06-22 NOTE — ED Notes (Signed)
First nurse Nira Conn aware that patient needs cab called.

## 2018-06-22 NOTE — ED Notes (Signed)
Patient ambulated with steady gait to room  Commode with stand-by assist.

## 2018-06-22 NOTE — ED Notes (Signed)
Dr. Jimmye Norman aware of lab value of potassium of 2.7.

## 2018-06-22 NOTE — ED Notes (Signed)
Patient called daughter to let her know she was leaving, but didn't asked for a ride home. Patient states her daughter doesn't have a car. Patient states she can pay for a taxi.

## 2018-06-22 NOTE — ED Triage Notes (Addendum)
Per EMS report, patient lives alone and called out for weakness and unsteady gait since she woke up this morning. Patient was discharged on 6/2 for same complaint and had a negative CVA evaluation. Patient is diabetic and had a blood sugar of 144 per EMS. Patient states she hasn't eaten anything today. EMS states patient had a negative stroke screen.,

## 2018-06-23 ENCOUNTER — Other Ambulatory Visit: Payer: Self-pay | Admitting: *Deleted

## 2018-06-23 NOTE — Patient Outreach (Signed)
[place] [facility]) Care Management  06/23/2018  Samantha Aguirre Feb 12, 1945 [id number]   Subjective: Telephone call to patient's home / mobile number, spoke with patient, and HIPAA verified.  Discussed [facility] Care Management Medicare EMMI General Discharge Red Flag Alert follow up, patient voiced understanding, and is in agreement to follow up.  Patient states she is doing okay, remembers receiving EMMI automated calls, and remembers receiving [facility] Management services in the past.  Patient states she has been feeling a little down, sad due to her sister and new great grand baby having health issues.   States she is not taking any medications for depression or anxiety, does not have a history of depression, declined referral to [place] Worker for counseling or to follow up with primary MD at this time, does not feel like she needs anything like that at this time.   States she is doing a lot praying and has  great family / friends support system.  Patient states she is aware of signs/ symptoms to report, how to reach provider if needed after hours, when to go to ED, and / or call 911.  States she has a follow up appointment with primary MD on 06/30/2018 and with neurologist on 07/06/2018.  Patient states she is able to manage self care and has assistance as needed.  States she is taking medications as prescribed and the bubble packs are working well for her.  Patient states she does not have any education material, EMMI follow up, care coordination, care management, disease monitoring, transportation, community resource, or pharmacy needs at this time.   States she is very appreciative of the follow up and will access [place] Management if services needed in the future.    Objective:Per KPN (Knowledge Performance Now, point of care tool) and chart review,patient hospitalized 06/13/2018 - 06/15/2018 for left side numbness.   Patient had an ED visit on 06/22/2018 for weakness and  hypokalemia.Patient also has a history of asthma, CAD, hyperlipidemia, thyroid disease, skin cancer, hypertension, diabetes, COPD, chronic diastolic congestive heart failure, atrial fibrillation, hypothyroidism, CVA with residual left side hemiparesis, and tremors.     Assessment: Received Medicare EMMI General Discharge Red Alert flag follow up on 06/21/2018. Red alert flag trigger, Day #4, patient answered yes to the following question: Sad/hopeless/anxious/empty?  EMMI follow up completed and declined [place] Management services at this time.      Plan: Case closure due to declined further services.    Samantha Aguirre, BSN, [place] Management [facility] Telephonic CM Phone: [phone] Fax: [phone]

## 2018-06-24 DIAGNOSIS — J449 Chronic obstructive pulmonary disease, unspecified: Secondary | ICD-10-CM | POA: Diagnosis not present

## 2018-06-24 DIAGNOSIS — G459 Transient cerebral ischemic attack, unspecified: Secondary | ICD-10-CM | POA: Diagnosis not present

## 2018-06-24 DIAGNOSIS — I110 Hypertensive heart disease with heart failure: Secondary | ICD-10-CM | POA: Diagnosis not present

## 2018-06-24 DIAGNOSIS — I5032 Chronic diastolic (congestive) heart failure: Secondary | ICD-10-CM | POA: Diagnosis not present

## 2018-06-24 DIAGNOSIS — S32050D Wedge compression fracture of fifth lumbar vertebra, subsequent encounter for fracture with routine healing: Secondary | ICD-10-CM | POA: Diagnosis not present

## 2018-06-24 DIAGNOSIS — I4891 Unspecified atrial fibrillation: Secondary | ICD-10-CM | POA: Diagnosis not present

## 2018-06-28 DIAGNOSIS — I5032 Chronic diastolic (congestive) heart failure: Secondary | ICD-10-CM | POA: Diagnosis not present

## 2018-06-28 DIAGNOSIS — I4891 Unspecified atrial fibrillation: Secondary | ICD-10-CM | POA: Diagnosis not present

## 2018-06-28 DIAGNOSIS — G459 Transient cerebral ischemic attack, unspecified: Secondary | ICD-10-CM | POA: Diagnosis not present

## 2018-06-28 DIAGNOSIS — I110 Hypertensive heart disease with heart failure: Secondary | ICD-10-CM | POA: Diagnosis not present

## 2018-06-28 DIAGNOSIS — J449 Chronic obstructive pulmonary disease, unspecified: Secondary | ICD-10-CM | POA: Diagnosis not present

## 2018-06-28 DIAGNOSIS — S32050D Wedge compression fracture of fifth lumbar vertebra, subsequent encounter for fracture with routine healing: Secondary | ICD-10-CM | POA: Diagnosis not present

## 2018-07-05 DIAGNOSIS — I4891 Unspecified atrial fibrillation: Secondary | ICD-10-CM | POA: Diagnosis not present

## 2018-07-05 DIAGNOSIS — S32050D Wedge compression fracture of fifth lumbar vertebra, subsequent encounter for fracture with routine healing: Secondary | ICD-10-CM | POA: Diagnosis not present

## 2018-07-05 DIAGNOSIS — I5032 Chronic diastolic (congestive) heart failure: Secondary | ICD-10-CM | POA: Diagnosis not present

## 2018-07-05 DIAGNOSIS — I110 Hypertensive heart disease with heart failure: Secondary | ICD-10-CM | POA: Diagnosis not present

## 2018-07-05 DIAGNOSIS — G459 Transient cerebral ischemic attack, unspecified: Secondary | ICD-10-CM | POA: Diagnosis not present

## 2018-07-05 DIAGNOSIS — J449 Chronic obstructive pulmonary disease, unspecified: Secondary | ICD-10-CM | POA: Diagnosis not present

## 2018-07-06 ENCOUNTER — Other Ambulatory Visit: Payer: Self-pay | Admitting: Internal Medicine

## 2018-07-06 DIAGNOSIS — S32050D Wedge compression fracture of fifth lumbar vertebra, subsequent encounter for fracture with routine healing: Secondary | ICD-10-CM | POA: Diagnosis not present

## 2018-07-06 DIAGNOSIS — Z8673 Personal history of transient ischemic attack (TIA), and cerebral infarction without residual deficits: Secondary | ICD-10-CM

## 2018-07-06 DIAGNOSIS — I69359 Hemiplegia and hemiparesis following cerebral infarction affecting unspecified side: Secondary | ICD-10-CM

## 2018-07-06 DIAGNOSIS — R519 Headache, unspecified: Secondary | ICD-10-CM

## 2018-07-06 DIAGNOSIS — I110 Hypertensive heart disease with heart failure: Secondary | ICD-10-CM | POA: Diagnosis not present

## 2018-07-06 DIAGNOSIS — G459 Transient cerebral ischemic attack, unspecified: Secondary | ICD-10-CM | POA: Diagnosis not present

## 2018-07-06 DIAGNOSIS — J449 Chronic obstructive pulmonary disease, unspecified: Secondary | ICD-10-CM | POA: Diagnosis not present

## 2018-07-06 DIAGNOSIS — I4891 Unspecified atrial fibrillation: Secondary | ICD-10-CM | POA: Diagnosis not present

## 2018-07-06 DIAGNOSIS — I5032 Chronic diastolic (congestive) heart failure: Secondary | ICD-10-CM | POA: Diagnosis not present

## 2018-07-08 DIAGNOSIS — I4891 Unspecified atrial fibrillation: Secondary | ICD-10-CM | POA: Diagnosis not present

## 2018-07-08 DIAGNOSIS — S32050D Wedge compression fracture of fifth lumbar vertebra, subsequent encounter for fracture with routine healing: Secondary | ICD-10-CM | POA: Diagnosis not present

## 2018-07-08 DIAGNOSIS — I110 Hypertensive heart disease with heart failure: Secondary | ICD-10-CM | POA: Diagnosis not present

## 2018-07-08 DIAGNOSIS — G459 Transient cerebral ischemic attack, unspecified: Secondary | ICD-10-CM | POA: Diagnosis not present

## 2018-07-08 DIAGNOSIS — J449 Chronic obstructive pulmonary disease, unspecified: Secondary | ICD-10-CM | POA: Diagnosis not present

## 2018-07-08 DIAGNOSIS — I5032 Chronic diastolic (congestive) heart failure: Secondary | ICD-10-CM | POA: Diagnosis not present

## 2018-07-12 DIAGNOSIS — I110 Hypertensive heart disease with heart failure: Secondary | ICD-10-CM | POA: Diagnosis not present

## 2018-07-12 DIAGNOSIS — I4891 Unspecified atrial fibrillation: Secondary | ICD-10-CM | POA: Diagnosis not present

## 2018-07-12 DIAGNOSIS — I5032 Chronic diastolic (congestive) heart failure: Secondary | ICD-10-CM | POA: Diagnosis not present

## 2018-07-12 DIAGNOSIS — G459 Transient cerebral ischemic attack, unspecified: Secondary | ICD-10-CM | POA: Diagnosis not present

## 2018-07-12 DIAGNOSIS — J449 Chronic obstructive pulmonary disease, unspecified: Secondary | ICD-10-CM | POA: Diagnosis not present

## 2018-07-12 DIAGNOSIS — S32050D Wedge compression fracture of fifth lumbar vertebra, subsequent encounter for fracture with routine healing: Secondary | ICD-10-CM | POA: Diagnosis not present

## 2018-07-13 DIAGNOSIS — I110 Hypertensive heart disease with heart failure: Secondary | ICD-10-CM | POA: Diagnosis not present

## 2018-07-13 DIAGNOSIS — I5032 Chronic diastolic (congestive) heart failure: Secondary | ICD-10-CM | POA: Diagnosis not present

## 2018-07-13 DIAGNOSIS — I4891 Unspecified atrial fibrillation: Secondary | ICD-10-CM | POA: Diagnosis not present

## 2018-07-13 DIAGNOSIS — G459 Transient cerebral ischemic attack, unspecified: Secondary | ICD-10-CM | POA: Diagnosis not present

## 2018-07-13 DIAGNOSIS — S32050D Wedge compression fracture of fifth lumbar vertebra, subsequent encounter for fracture with routine healing: Secondary | ICD-10-CM | POA: Diagnosis not present

## 2018-07-13 DIAGNOSIS — J449 Chronic obstructive pulmonary disease, unspecified: Secondary | ICD-10-CM | POA: Diagnosis not present

## 2018-07-14 DIAGNOSIS — Z8673 Personal history of transient ischemic attack (TIA), and cerebral infarction without residual deficits: Secondary | ICD-10-CM | POA: Diagnosis not present

## 2018-07-14 DIAGNOSIS — Z720 Tobacco use: Secondary | ICD-10-CM | POA: Diagnosis not present

## 2018-07-14 DIAGNOSIS — E1140 Type 2 diabetes mellitus with diabetic neuropathy, unspecified: Secondary | ICD-10-CM | POA: Diagnosis not present

## 2018-07-14 DIAGNOSIS — R200 Anesthesia of skin: Secondary | ICD-10-CM | POA: Diagnosis not present

## 2018-07-14 DIAGNOSIS — I10 Essential (primary) hypertension: Secondary | ICD-10-CM | POA: Diagnosis not present

## 2018-07-14 DIAGNOSIS — R251 Tremor, unspecified: Secondary | ICD-10-CM | POA: Diagnosis not present

## 2018-07-14 DIAGNOSIS — D649 Anemia, unspecified: Secondary | ICD-10-CM | POA: Diagnosis not present

## 2018-07-14 DIAGNOSIS — J449 Chronic obstructive pulmonary disease, unspecified: Secondary | ICD-10-CM | POA: Diagnosis not present

## 2018-07-14 DIAGNOSIS — I5032 Chronic diastolic (congestive) heart failure: Secondary | ICD-10-CM | POA: Diagnosis not present

## 2018-07-14 DIAGNOSIS — Z0000 Encounter for general adult medical examination without abnormal findings: Secondary | ICD-10-CM | POA: Diagnosis not present

## 2018-07-16 DIAGNOSIS — S32050D Wedge compression fracture of fifth lumbar vertebra, subsequent encounter for fracture with routine healing: Secondary | ICD-10-CM | POA: Diagnosis not present

## 2018-07-16 DIAGNOSIS — I110 Hypertensive heart disease with heart failure: Secondary | ICD-10-CM | POA: Diagnosis not present

## 2018-07-16 DIAGNOSIS — I4891 Unspecified atrial fibrillation: Secondary | ICD-10-CM | POA: Diagnosis not present

## 2018-07-16 DIAGNOSIS — I5032 Chronic diastolic (congestive) heart failure: Secondary | ICD-10-CM | POA: Diagnosis not present

## 2018-07-16 DIAGNOSIS — E119 Type 2 diabetes mellitus without complications: Secondary | ICD-10-CM | POA: Diagnosis not present

## 2018-07-16 DIAGNOSIS — G4089 Other seizures: Secondary | ICD-10-CM | POA: Diagnosis not present

## 2018-07-16 DIAGNOSIS — Z87891 Personal history of nicotine dependence: Secondary | ICD-10-CM | POA: Diagnosis not present

## 2018-07-16 DIAGNOSIS — J449 Chronic obstructive pulmonary disease, unspecified: Secondary | ICD-10-CM | POA: Diagnosis not present

## 2018-07-16 DIAGNOSIS — Z7984 Long term (current) use of oral hypoglycemic drugs: Secondary | ICD-10-CM | POA: Diagnosis not present

## 2018-07-16 DIAGNOSIS — F17210 Nicotine dependence, cigarettes, uncomplicated: Secondary | ICD-10-CM | POA: Diagnosis not present

## 2018-07-16 DIAGNOSIS — R2689 Other abnormalities of gait and mobility: Secondary | ICD-10-CM | POA: Diagnosis not present

## 2018-07-16 DIAGNOSIS — M6281 Muscle weakness (generalized): Secondary | ICD-10-CM | POA: Diagnosis not present

## 2018-07-16 DIAGNOSIS — G459 Transient cerebral ischemic attack, unspecified: Secondary | ICD-10-CM | POA: Diagnosis not present

## 2018-07-16 DIAGNOSIS — Z7982 Long term (current) use of aspirin: Secondary | ICD-10-CM | POA: Diagnosis not present

## 2018-07-19 DIAGNOSIS — I110 Hypertensive heart disease with heart failure: Secondary | ICD-10-CM | POA: Diagnosis not present

## 2018-07-19 DIAGNOSIS — I5032 Chronic diastolic (congestive) heart failure: Secondary | ICD-10-CM | POA: Diagnosis not present

## 2018-07-19 DIAGNOSIS — E119 Type 2 diabetes mellitus without complications: Secondary | ICD-10-CM | POA: Diagnosis not present

## 2018-07-19 DIAGNOSIS — G459 Transient cerebral ischemic attack, unspecified: Secondary | ICD-10-CM | POA: Diagnosis not present

## 2018-07-19 DIAGNOSIS — J449 Chronic obstructive pulmonary disease, unspecified: Secondary | ICD-10-CM | POA: Diagnosis not present

## 2018-07-19 DIAGNOSIS — I4891 Unspecified atrial fibrillation: Secondary | ICD-10-CM | POA: Diagnosis not present

## 2018-07-20 DIAGNOSIS — I5032 Chronic diastolic (congestive) heart failure: Secondary | ICD-10-CM | POA: Diagnosis not present

## 2018-07-20 DIAGNOSIS — I110 Hypertensive heart disease with heart failure: Secondary | ICD-10-CM | POA: Diagnosis not present

## 2018-07-20 DIAGNOSIS — J449 Chronic obstructive pulmonary disease, unspecified: Secondary | ICD-10-CM | POA: Diagnosis not present

## 2018-07-20 DIAGNOSIS — G459 Transient cerebral ischemic attack, unspecified: Secondary | ICD-10-CM | POA: Diagnosis not present

## 2018-07-20 DIAGNOSIS — E119 Type 2 diabetes mellitus without complications: Secondary | ICD-10-CM | POA: Diagnosis not present

## 2018-07-20 DIAGNOSIS — I4891 Unspecified atrial fibrillation: Secondary | ICD-10-CM | POA: Diagnosis not present

## 2018-07-22 ENCOUNTER — Ambulatory Visit: Admission: RE | Admit: 2018-07-22 | Payer: Medicare Other | Source: Ambulatory Visit

## 2018-07-28 DIAGNOSIS — I110 Hypertensive heart disease with heart failure: Secondary | ICD-10-CM | POA: Diagnosis not present

## 2018-07-28 DIAGNOSIS — I5032 Chronic diastolic (congestive) heart failure: Secondary | ICD-10-CM | POA: Diagnosis not present

## 2018-07-28 DIAGNOSIS — J449 Chronic obstructive pulmonary disease, unspecified: Secondary | ICD-10-CM | POA: Diagnosis not present

## 2018-07-28 DIAGNOSIS — G459 Transient cerebral ischemic attack, unspecified: Secondary | ICD-10-CM | POA: Diagnosis not present

## 2018-07-28 DIAGNOSIS — I4891 Unspecified atrial fibrillation: Secondary | ICD-10-CM | POA: Diagnosis not present

## 2018-07-28 DIAGNOSIS — E119 Type 2 diabetes mellitus without complications: Secondary | ICD-10-CM | POA: Diagnosis not present

## 2018-07-29 DIAGNOSIS — E119 Type 2 diabetes mellitus without complications: Secondary | ICD-10-CM | POA: Diagnosis not present

## 2018-07-29 DIAGNOSIS — I5032 Chronic diastolic (congestive) heart failure: Secondary | ICD-10-CM | POA: Diagnosis not present

## 2018-07-29 DIAGNOSIS — G459 Transient cerebral ischemic attack, unspecified: Secondary | ICD-10-CM | POA: Diagnosis not present

## 2018-07-29 DIAGNOSIS — I4891 Unspecified atrial fibrillation: Secondary | ICD-10-CM | POA: Diagnosis not present

## 2018-07-29 DIAGNOSIS — J449 Chronic obstructive pulmonary disease, unspecified: Secondary | ICD-10-CM | POA: Diagnosis not present

## 2018-07-29 DIAGNOSIS — I110 Hypertensive heart disease with heart failure: Secondary | ICD-10-CM | POA: Diagnosis not present

## 2018-07-30 DIAGNOSIS — I5032 Chronic diastolic (congestive) heart failure: Secondary | ICD-10-CM | POA: Diagnosis not present

## 2018-07-30 DIAGNOSIS — G459 Transient cerebral ischemic attack, unspecified: Secondary | ICD-10-CM | POA: Diagnosis not present

## 2018-07-30 DIAGNOSIS — E119 Type 2 diabetes mellitus without complications: Secondary | ICD-10-CM | POA: Diagnosis not present

## 2018-07-30 DIAGNOSIS — I110 Hypertensive heart disease with heart failure: Secondary | ICD-10-CM | POA: Diagnosis not present

## 2018-07-30 DIAGNOSIS — I4891 Unspecified atrial fibrillation: Secondary | ICD-10-CM | POA: Diagnosis not present

## 2018-07-30 DIAGNOSIS — J449 Chronic obstructive pulmonary disease, unspecified: Secondary | ICD-10-CM | POA: Diagnosis not present

## 2018-08-03 DIAGNOSIS — I110 Hypertensive heart disease with heart failure: Secondary | ICD-10-CM | POA: Diagnosis not present

## 2018-08-03 DIAGNOSIS — J449 Chronic obstructive pulmonary disease, unspecified: Secondary | ICD-10-CM | POA: Diagnosis not present

## 2018-08-03 DIAGNOSIS — E119 Type 2 diabetes mellitus without complications: Secondary | ICD-10-CM | POA: Diagnosis not present

## 2018-08-03 DIAGNOSIS — I5032 Chronic diastolic (congestive) heart failure: Secondary | ICD-10-CM | POA: Diagnosis not present

## 2018-08-03 DIAGNOSIS — G459 Transient cerebral ischemic attack, unspecified: Secondary | ICD-10-CM | POA: Diagnosis not present

## 2018-08-03 DIAGNOSIS — I4891 Unspecified atrial fibrillation: Secondary | ICD-10-CM | POA: Diagnosis not present

## 2018-08-06 DIAGNOSIS — J449 Chronic obstructive pulmonary disease, unspecified: Secondary | ICD-10-CM | POA: Diagnosis not present

## 2018-08-06 DIAGNOSIS — G459 Transient cerebral ischemic attack, unspecified: Secondary | ICD-10-CM | POA: Diagnosis not present

## 2018-08-06 DIAGNOSIS — I110 Hypertensive heart disease with heart failure: Secondary | ICD-10-CM | POA: Diagnosis not present

## 2018-08-06 DIAGNOSIS — I4891 Unspecified atrial fibrillation: Secondary | ICD-10-CM | POA: Diagnosis not present

## 2018-08-06 DIAGNOSIS — I5032 Chronic diastolic (congestive) heart failure: Secondary | ICD-10-CM | POA: Diagnosis not present

## 2018-08-06 DIAGNOSIS — E119 Type 2 diabetes mellitus without complications: Secondary | ICD-10-CM | POA: Diagnosis not present

## 2018-08-12 DIAGNOSIS — I5032 Chronic diastolic (congestive) heart failure: Secondary | ICD-10-CM | POA: Diagnosis not present

## 2018-08-12 DIAGNOSIS — G459 Transient cerebral ischemic attack, unspecified: Secondary | ICD-10-CM | POA: Diagnosis not present

## 2018-08-12 DIAGNOSIS — I110 Hypertensive heart disease with heart failure: Secondary | ICD-10-CM | POA: Diagnosis not present

## 2018-08-12 DIAGNOSIS — J449 Chronic obstructive pulmonary disease, unspecified: Secondary | ICD-10-CM | POA: Diagnosis not present

## 2018-08-12 DIAGNOSIS — I4891 Unspecified atrial fibrillation: Secondary | ICD-10-CM | POA: Diagnosis not present

## 2018-08-12 DIAGNOSIS — E119 Type 2 diabetes mellitus without complications: Secondary | ICD-10-CM | POA: Diagnosis not present

## 2018-08-15 DIAGNOSIS — E119 Type 2 diabetes mellitus without complications: Secondary | ICD-10-CM | POA: Diagnosis not present

## 2018-08-15 DIAGNOSIS — Z87891 Personal history of nicotine dependence: Secondary | ICD-10-CM | POA: Diagnosis not present

## 2018-08-15 DIAGNOSIS — F17210 Nicotine dependence, cigarettes, uncomplicated: Secondary | ICD-10-CM | POA: Diagnosis not present

## 2018-08-15 DIAGNOSIS — Z7984 Long term (current) use of oral hypoglycemic drugs: Secondary | ICD-10-CM | POA: Diagnosis not present

## 2018-08-15 DIAGNOSIS — Z7982 Long term (current) use of aspirin: Secondary | ICD-10-CM | POA: Diagnosis not present

## 2018-08-15 DIAGNOSIS — G459 Transient cerebral ischemic attack, unspecified: Secondary | ICD-10-CM | POA: Diagnosis not present

## 2018-08-15 DIAGNOSIS — I5032 Chronic diastolic (congestive) heart failure: Secondary | ICD-10-CM | POA: Diagnosis not present

## 2018-08-15 DIAGNOSIS — S32050D Wedge compression fracture of fifth lumbar vertebra, subsequent encounter for fracture with routine healing: Secondary | ICD-10-CM | POA: Diagnosis not present

## 2018-08-15 DIAGNOSIS — I4891 Unspecified atrial fibrillation: Secondary | ICD-10-CM | POA: Diagnosis not present

## 2018-08-15 DIAGNOSIS — J449 Chronic obstructive pulmonary disease, unspecified: Secondary | ICD-10-CM | POA: Diagnosis not present

## 2018-08-15 DIAGNOSIS — I110 Hypertensive heart disease with heart failure: Secondary | ICD-10-CM | POA: Diagnosis not present

## 2018-08-15 DIAGNOSIS — M6281 Muscle weakness (generalized): Secondary | ICD-10-CM | POA: Diagnosis not present

## 2018-08-15 DIAGNOSIS — G4089 Other seizures: Secondary | ICD-10-CM | POA: Diagnosis not present

## 2018-08-15 DIAGNOSIS — R2689 Other abnormalities of gait and mobility: Secondary | ICD-10-CM | POA: Diagnosis not present

## 2018-08-17 DIAGNOSIS — I5032 Chronic diastolic (congestive) heart failure: Secondary | ICD-10-CM | POA: Diagnosis not present

## 2018-08-17 DIAGNOSIS — J449 Chronic obstructive pulmonary disease, unspecified: Secondary | ICD-10-CM | POA: Diagnosis not present

## 2018-08-17 DIAGNOSIS — I110 Hypertensive heart disease with heart failure: Secondary | ICD-10-CM | POA: Diagnosis not present

## 2018-08-17 DIAGNOSIS — E119 Type 2 diabetes mellitus without complications: Secondary | ICD-10-CM | POA: Diagnosis not present

## 2018-08-17 DIAGNOSIS — I4891 Unspecified atrial fibrillation: Secondary | ICD-10-CM | POA: Diagnosis not present

## 2018-08-17 DIAGNOSIS — G459 Transient cerebral ischemic attack, unspecified: Secondary | ICD-10-CM | POA: Diagnosis not present

## 2018-08-24 DIAGNOSIS — G459 Transient cerebral ischemic attack, unspecified: Secondary | ICD-10-CM | POA: Diagnosis not present

## 2018-08-24 DIAGNOSIS — E119 Type 2 diabetes mellitus without complications: Secondary | ICD-10-CM | POA: Diagnosis not present

## 2018-08-24 DIAGNOSIS — I5032 Chronic diastolic (congestive) heart failure: Secondary | ICD-10-CM | POA: Diagnosis not present

## 2018-08-24 DIAGNOSIS — J449 Chronic obstructive pulmonary disease, unspecified: Secondary | ICD-10-CM | POA: Diagnosis not present

## 2018-08-24 DIAGNOSIS — I4891 Unspecified atrial fibrillation: Secondary | ICD-10-CM | POA: Diagnosis not present

## 2018-08-24 DIAGNOSIS — I110 Hypertensive heart disease with heart failure: Secondary | ICD-10-CM | POA: Diagnosis not present

## 2018-08-26 DIAGNOSIS — Z8673 Personal history of transient ischemic attack (TIA), and cerebral infarction without residual deficits: Secondary | ICD-10-CM | POA: Diagnosis not present

## 2018-08-26 DIAGNOSIS — I4891 Unspecified atrial fibrillation: Secondary | ICD-10-CM | POA: Diagnosis not present

## 2018-08-26 DIAGNOSIS — G459 Transient cerebral ischemic attack, unspecified: Secondary | ICD-10-CM | POA: Diagnosis not present

## 2018-08-26 DIAGNOSIS — J449 Chronic obstructive pulmonary disease, unspecified: Secondary | ICD-10-CM | POA: Diagnosis not present

## 2018-08-26 DIAGNOSIS — L739 Follicular disorder, unspecified: Secondary | ICD-10-CM | POA: Diagnosis not present

## 2018-08-26 DIAGNOSIS — Z720 Tobacco use: Secondary | ICD-10-CM | POA: Diagnosis not present

## 2018-08-26 DIAGNOSIS — E119 Type 2 diabetes mellitus without complications: Secondary | ICD-10-CM | POA: Diagnosis not present

## 2018-08-26 DIAGNOSIS — I5032 Chronic diastolic (congestive) heart failure: Secondary | ICD-10-CM | POA: Diagnosis not present

## 2018-08-26 DIAGNOSIS — E1140 Type 2 diabetes mellitus with diabetic neuropathy, unspecified: Secondary | ICD-10-CM | POA: Diagnosis not present

## 2018-08-26 DIAGNOSIS — I69392 Facial weakness following cerebral infarction: Secondary | ICD-10-CM | POA: Diagnosis not present

## 2018-08-26 DIAGNOSIS — R21 Rash and other nonspecific skin eruption: Secondary | ICD-10-CM | POA: Diagnosis not present

## 2018-08-26 DIAGNOSIS — I110 Hypertensive heart disease with heart failure: Secondary | ICD-10-CM | POA: Diagnosis not present

## 2018-09-01 DIAGNOSIS — G459 Transient cerebral ischemic attack, unspecified: Secondary | ICD-10-CM | POA: Diagnosis not present

## 2018-09-01 DIAGNOSIS — I110 Hypertensive heart disease with heart failure: Secondary | ICD-10-CM | POA: Diagnosis not present

## 2018-09-01 DIAGNOSIS — I5032 Chronic diastolic (congestive) heart failure: Secondary | ICD-10-CM | POA: Diagnosis not present

## 2018-09-01 DIAGNOSIS — E119 Type 2 diabetes mellitus without complications: Secondary | ICD-10-CM | POA: Diagnosis not present

## 2018-09-01 DIAGNOSIS — J449 Chronic obstructive pulmonary disease, unspecified: Secondary | ICD-10-CM | POA: Diagnosis not present

## 2018-09-01 DIAGNOSIS — I4891 Unspecified atrial fibrillation: Secondary | ICD-10-CM | POA: Diagnosis not present

## 2018-09-10 DIAGNOSIS — I110 Hypertensive heart disease with heart failure: Secondary | ICD-10-CM | POA: Diagnosis not present

## 2018-09-10 DIAGNOSIS — J449 Chronic obstructive pulmonary disease, unspecified: Secondary | ICD-10-CM | POA: Diagnosis not present

## 2018-09-10 DIAGNOSIS — G459 Transient cerebral ischemic attack, unspecified: Secondary | ICD-10-CM | POA: Diagnosis not present

## 2018-09-10 DIAGNOSIS — E119 Type 2 diabetes mellitus without complications: Secondary | ICD-10-CM | POA: Diagnosis not present

## 2018-09-10 DIAGNOSIS — I5032 Chronic diastolic (congestive) heart failure: Secondary | ICD-10-CM | POA: Diagnosis not present

## 2018-09-10 DIAGNOSIS — I4891 Unspecified atrial fibrillation: Secondary | ICD-10-CM | POA: Diagnosis not present

## 2018-09-14 DIAGNOSIS — M6281 Muscle weakness (generalized): Secondary | ICD-10-CM | POA: Diagnosis not present

## 2018-09-14 DIAGNOSIS — J449 Chronic obstructive pulmonary disease, unspecified: Secondary | ICD-10-CM | POA: Diagnosis not present

## 2018-09-14 DIAGNOSIS — R202 Paresthesia of skin: Secondary | ICD-10-CM | POA: Diagnosis not present

## 2018-09-14 DIAGNOSIS — G459 Transient cerebral ischemic attack, unspecified: Secondary | ICD-10-CM | POA: Diagnosis not present

## 2018-09-14 DIAGNOSIS — Z87891 Personal history of nicotine dependence: Secondary | ICD-10-CM | POA: Diagnosis not present

## 2018-09-14 DIAGNOSIS — I110 Hypertensive heart disease with heart failure: Secondary | ICD-10-CM | POA: Diagnosis not present

## 2018-09-14 DIAGNOSIS — F17210 Nicotine dependence, cigarettes, uncomplicated: Secondary | ICD-10-CM | POA: Diagnosis not present

## 2018-09-14 DIAGNOSIS — I5032 Chronic diastolic (congestive) heart failure: Secondary | ICD-10-CM | POA: Diagnosis not present

## 2018-09-14 DIAGNOSIS — S32050D Wedge compression fracture of fifth lumbar vertebra, subsequent encounter for fracture with routine healing: Secondary | ICD-10-CM | POA: Diagnosis not present

## 2018-09-14 DIAGNOSIS — Z7984 Long term (current) use of oral hypoglycemic drugs: Secondary | ICD-10-CM | POA: Diagnosis not present

## 2018-09-14 DIAGNOSIS — Z7982 Long term (current) use of aspirin: Secondary | ICD-10-CM | POA: Diagnosis not present

## 2018-09-14 DIAGNOSIS — I4891 Unspecified atrial fibrillation: Secondary | ICD-10-CM | POA: Diagnosis not present

## 2018-09-14 DIAGNOSIS — G4089 Other seizures: Secondary | ICD-10-CM | POA: Diagnosis not present

## 2018-09-14 DIAGNOSIS — E119 Type 2 diabetes mellitus without complications: Secondary | ICD-10-CM | POA: Diagnosis not present

## 2018-09-23 DIAGNOSIS — I5032 Chronic diastolic (congestive) heart failure: Secondary | ICD-10-CM | POA: Diagnosis not present

## 2018-09-23 DIAGNOSIS — I4891 Unspecified atrial fibrillation: Secondary | ICD-10-CM | POA: Diagnosis not present

## 2018-09-23 DIAGNOSIS — E119 Type 2 diabetes mellitus without complications: Secondary | ICD-10-CM | POA: Diagnosis not present

## 2018-09-23 DIAGNOSIS — R5383 Other fatigue: Secondary | ICD-10-CM | POA: Diagnosis not present

## 2018-09-23 DIAGNOSIS — Z7984 Long term (current) use of oral hypoglycemic drugs: Secondary | ICD-10-CM | POA: Diagnosis not present

## 2018-09-23 DIAGNOSIS — I110 Hypertensive heart disease with heart failure: Secondary | ICD-10-CM | POA: Diagnosis not present

## 2018-09-23 DIAGNOSIS — G459 Transient cerebral ischemic attack, unspecified: Secondary | ICD-10-CM | POA: Diagnosis not present

## 2018-10-04 DIAGNOSIS — G459 Transient cerebral ischemic attack, unspecified: Secondary | ICD-10-CM | POA: Diagnosis not present

## 2018-10-04 DIAGNOSIS — I4891 Unspecified atrial fibrillation: Secondary | ICD-10-CM | POA: Diagnosis not present

## 2018-10-04 DIAGNOSIS — Z7984 Long term (current) use of oral hypoglycemic drugs: Secondary | ICD-10-CM | POA: Diagnosis not present

## 2018-10-04 DIAGNOSIS — I110 Hypertensive heart disease with heart failure: Secondary | ICD-10-CM | POA: Diagnosis not present

## 2018-10-04 DIAGNOSIS — E119 Type 2 diabetes mellitus without complications: Secondary | ICD-10-CM | POA: Diagnosis not present

## 2018-10-04 DIAGNOSIS — I5032 Chronic diastolic (congestive) heart failure: Secondary | ICD-10-CM | POA: Diagnosis not present

## 2018-10-06 DIAGNOSIS — I4891 Unspecified atrial fibrillation: Secondary | ICD-10-CM | POA: Diagnosis not present

## 2018-10-06 DIAGNOSIS — Z7984 Long term (current) use of oral hypoglycemic drugs: Secondary | ICD-10-CM | POA: Diagnosis not present

## 2018-10-06 DIAGNOSIS — I5032 Chronic diastolic (congestive) heart failure: Secondary | ICD-10-CM | POA: Diagnosis not present

## 2018-10-06 DIAGNOSIS — I110 Hypertensive heart disease with heart failure: Secondary | ICD-10-CM | POA: Diagnosis not present

## 2018-10-06 DIAGNOSIS — E119 Type 2 diabetes mellitus without complications: Secondary | ICD-10-CM | POA: Diagnosis not present

## 2018-10-06 DIAGNOSIS — G459 Transient cerebral ischemic attack, unspecified: Secondary | ICD-10-CM | POA: Diagnosis not present

## 2018-10-14 DIAGNOSIS — F17210 Nicotine dependence, cigarettes, uncomplicated: Secondary | ICD-10-CM | POA: Diagnosis not present

## 2018-10-14 DIAGNOSIS — Z7982 Long term (current) use of aspirin: Secondary | ICD-10-CM | POA: Diagnosis not present

## 2018-10-14 DIAGNOSIS — G459 Transient cerebral ischemic attack, unspecified: Secondary | ICD-10-CM | POA: Diagnosis not present

## 2018-10-14 DIAGNOSIS — Z87891 Personal history of nicotine dependence: Secondary | ICD-10-CM | POA: Diagnosis not present

## 2018-10-14 DIAGNOSIS — E119 Type 2 diabetes mellitus without complications: Secondary | ICD-10-CM | POA: Diagnosis not present

## 2018-10-14 DIAGNOSIS — I4891 Unspecified atrial fibrillation: Secondary | ICD-10-CM | POA: Diagnosis not present

## 2018-10-14 DIAGNOSIS — I5032 Chronic diastolic (congestive) heart failure: Secondary | ICD-10-CM | POA: Diagnosis not present

## 2018-10-14 DIAGNOSIS — S32050D Wedge compression fracture of fifth lumbar vertebra, subsequent encounter for fracture with routine healing: Secondary | ICD-10-CM | POA: Diagnosis not present

## 2018-10-14 DIAGNOSIS — Z7984 Long term (current) use of oral hypoglycemic drugs: Secondary | ICD-10-CM | POA: Diagnosis not present

## 2018-10-14 DIAGNOSIS — R202 Paresthesia of skin: Secondary | ICD-10-CM | POA: Diagnosis not present

## 2018-10-14 DIAGNOSIS — J449 Chronic obstructive pulmonary disease, unspecified: Secondary | ICD-10-CM | POA: Diagnosis not present

## 2018-10-14 DIAGNOSIS — M6281 Muscle weakness (generalized): Secondary | ICD-10-CM | POA: Diagnosis not present

## 2018-10-14 DIAGNOSIS — G4089 Other seizures: Secondary | ICD-10-CM | POA: Diagnosis not present

## 2018-10-14 DIAGNOSIS — I110 Hypertensive heart disease with heart failure: Secondary | ICD-10-CM | POA: Diagnosis not present

## 2018-10-15 ENCOUNTER — Emergency Department: Payer: Medicare Other

## 2018-10-15 ENCOUNTER — Inpatient Hospital Stay: Payer: Medicare Other

## 2018-10-15 ENCOUNTER — Other Ambulatory Visit: Payer: Self-pay

## 2018-10-15 ENCOUNTER — Observation Stay
Admission: EM | Admit: 2018-10-15 | Discharge: 2018-10-18 | Disposition: A | Discharge status: Home / Self Care | Payer: Medicare Other | Attending: Internal Medicine | Admitting: Internal Medicine

## 2018-10-15 DIAGNOSIS — I110 Hypertensive heart disease with heart failure: Secondary | ICD-10-CM | POA: Diagnosis not present

## 2018-10-15 DIAGNOSIS — J449 Chronic obstructive pulmonary disease, unspecified: Secondary | ICD-10-CM | POA: Diagnosis not present

## 2018-10-15 DIAGNOSIS — E785 Hyperlipidemia, unspecified: Secondary | ICD-10-CM | POA: Insufficient documentation

## 2018-10-15 DIAGNOSIS — Z716 Tobacco abuse counseling: Secondary | ICD-10-CM | POA: Diagnosis not present

## 2018-10-15 DIAGNOSIS — I480 Paroxysmal atrial fibrillation: Secondary | ICD-10-CM | POA: Diagnosis not present

## 2018-10-15 DIAGNOSIS — M19072 Primary osteoarthritis, left ankle and foot: Secondary | ICD-10-CM | POA: Insufficient documentation

## 2018-10-15 DIAGNOSIS — R531 Weakness: Secondary | ICD-10-CM | POA: Diagnosis not present

## 2018-10-15 DIAGNOSIS — E1151 Type 2 diabetes mellitus with diabetic peripheral angiopathy without gangrene: Secondary | ICD-10-CM | POA: Insufficient documentation

## 2018-10-15 DIAGNOSIS — G4489 Other headache syndrome: Secondary | ICD-10-CM | POA: Diagnosis not present

## 2018-10-15 DIAGNOSIS — Z8249 Family history of ischemic heart disease and other diseases of the circulatory system: Secondary | ICD-10-CM | POA: Insufficient documentation

## 2018-10-15 DIAGNOSIS — I639 Cerebral infarction, unspecified: Secondary | ICD-10-CM | POA: Diagnosis not present

## 2018-10-15 DIAGNOSIS — Z9181 History of falling: Secondary | ICD-10-CM | POA: Insufficient documentation

## 2018-10-15 DIAGNOSIS — R569 Unspecified convulsions: Secondary | ICD-10-CM | POA: Diagnosis not present

## 2018-10-15 DIAGNOSIS — M19041 Primary osteoarthritis, right hand: Secondary | ICD-10-CM | POA: Insufficient documentation

## 2018-10-15 DIAGNOSIS — E039 Hypothyroidism, unspecified: Secondary | ICD-10-CM | POA: Insufficient documentation

## 2018-10-15 DIAGNOSIS — R2681 Unsteadiness on feet: Secondary | ICD-10-CM | POA: Insufficient documentation

## 2018-10-15 DIAGNOSIS — I5032 Chronic diastolic (congestive) heart failure: Secondary | ICD-10-CM | POA: Diagnosis not present

## 2018-10-15 DIAGNOSIS — F17210 Nicotine dependence, cigarettes, uncomplicated: Secondary | ICD-10-CM | POA: Diagnosis not present

## 2018-10-15 DIAGNOSIS — R29818 Other symptoms and signs involving the nervous system: Secondary | ICD-10-CM | POA: Diagnosis not present

## 2018-10-15 DIAGNOSIS — K219 Gastro-esophageal reflux disease without esophagitis: Secondary | ICD-10-CM | POA: Diagnosis not present

## 2018-10-15 DIAGNOSIS — Z833 Family history of diabetes mellitus: Secondary | ICD-10-CM | POA: Insufficient documentation

## 2018-10-15 DIAGNOSIS — I081 Rheumatic disorders of both mitral and tricuspid valves: Secondary | ICD-10-CM | POA: Insufficient documentation

## 2018-10-15 DIAGNOSIS — Z85828 Personal history of other malignant neoplasm of skin: Secondary | ICD-10-CM | POA: Insufficient documentation

## 2018-10-15 DIAGNOSIS — Z885 Allergy status to narcotic agent status: Secondary | ICD-10-CM | POA: Insufficient documentation

## 2018-10-15 DIAGNOSIS — Z79890 Hormone replacement therapy (postmenopausal): Secondary | ICD-10-CM | POA: Insufficient documentation

## 2018-10-15 DIAGNOSIS — M170 Bilateral primary osteoarthritis of knee: Secondary | ICD-10-CM | POA: Diagnosis not present

## 2018-10-15 DIAGNOSIS — Z66 Do not resuscitate: Secondary | ICD-10-CM | POA: Insufficient documentation

## 2018-10-15 DIAGNOSIS — S79912A Unspecified injury of left hip, initial encounter: Secondary | ICD-10-CM | POA: Diagnosis not present

## 2018-10-15 DIAGNOSIS — Z7984 Long term (current) use of oral hypoglycemic drugs: Secondary | ICD-10-CM | POA: Insufficient documentation

## 2018-10-15 DIAGNOSIS — R4781 Slurred speech: Secondary | ICD-10-CM | POA: Diagnosis not present

## 2018-10-15 DIAGNOSIS — M25552 Pain in left hip: Secondary | ICD-10-CM | POA: Insufficient documentation

## 2018-10-15 DIAGNOSIS — G9389 Other specified disorders of brain: Secondary | ICD-10-CM | POA: Diagnosis not present

## 2018-10-15 DIAGNOSIS — W19XXXA Unspecified fall, initial encounter: Secondary | ICD-10-CM | POA: Diagnosis not present

## 2018-10-15 DIAGNOSIS — Z20828 Contact with and (suspected) exposure to other viral communicable diseases: Secondary | ICD-10-CM | POA: Diagnosis not present

## 2018-10-15 DIAGNOSIS — I2510 Atherosclerotic heart disease of native coronary artery without angina pectoris: Secondary | ICD-10-CM | POA: Insufficient documentation

## 2018-10-15 DIAGNOSIS — Z7982 Long term (current) use of aspirin: Secondary | ICD-10-CM | POA: Insufficient documentation

## 2018-10-15 DIAGNOSIS — R29810 Facial weakness: Secondary | ICD-10-CM | POA: Diagnosis not present

## 2018-10-15 DIAGNOSIS — Z23 Encounter for immunization: Secondary | ICD-10-CM | POA: Diagnosis not present

## 2018-10-15 DIAGNOSIS — Z7902 Long term (current) use of antithrombotics/antiplatelets: Secondary | ICD-10-CM | POA: Insufficient documentation

## 2018-10-15 DIAGNOSIS — F329 Major depressive disorder, single episode, unspecified: Secondary | ICD-10-CM | POA: Insufficient documentation

## 2018-10-15 DIAGNOSIS — Z888 Allergy status to other drugs, medicaments and biological substances status: Secondary | ICD-10-CM | POA: Insufficient documentation

## 2018-10-15 DIAGNOSIS — M19071 Primary osteoarthritis, right ankle and foot: Secondary | ICD-10-CM | POA: Insufficient documentation

## 2018-10-15 DIAGNOSIS — E1140 Type 2 diabetes mellitus with diabetic neuropathy, unspecified: Secondary | ICD-10-CM | POA: Insufficient documentation

## 2018-10-15 DIAGNOSIS — M19042 Primary osteoarthritis, left hand: Secondary | ICD-10-CM | POA: Diagnosis not present

## 2018-10-15 DIAGNOSIS — I6789 Other cerebrovascular disease: Secondary | ICD-10-CM | POA: Diagnosis not present

## 2018-10-15 DIAGNOSIS — Z886 Allergy status to analgesic agent status: Secondary | ICD-10-CM | POA: Insufficient documentation

## 2018-10-15 DIAGNOSIS — Z79899 Other long term (current) drug therapy: Secondary | ICD-10-CM | POA: Insufficient documentation

## 2018-10-15 LAB — COMPREHENSIVE METABOLIC PANEL
ALT: 20 U/L (ref 0–44)
AST: 24 U/L (ref 15–41)
Albumin: 4 g/dL (ref 3.5–5.0)
Alkaline Phosphatase: 120 U/L (ref 38–126)
Anion gap: 13 (ref 5–15)
BUN: 6 mg/dL — ABNORMAL LOW (ref 8–23)
CO2: 24 mmol/L (ref 22–32)
Calcium: 8.9 mg/dL (ref 8.9–10.3)
Chloride: 105 mmol/L (ref 98–111)
Creatinine, Ser: 0.68 mg/dL (ref 0.44–1.00)
GFR calc Af Amer: >60 mL/min (ref >60)
GFR calc non Af Amer: >60 mL/min (ref >60)
Glucose, Bld: 107 mg/dL — ABNORMAL HIGH (ref 70–99)
Potassium: 2.8 mmol/L — ABNORMAL LOW (ref 3.5–5.1)
Sodium: 142 mmol/L (ref 135–145)
Total Bilirubin: 0.7 mg/dL (ref 0.3–1.2)
Total Protein: 7.1 g/dL (ref 6.5–8.1)

## 2018-10-15 LAB — CBC
HCT: 40.7 % (ref 36.0–46.0)
Hemoglobin: 13.3 g/dL (ref 12.0–15.0)
MCH: 28.9 pg (ref 26.0–34.0)
MCHC: 32.7 g/dL (ref 30.0–36.0)
MCV: 88.3 fL (ref 80.0–100.0)
Platelets: 299 10*3/uL (ref 150–400)
RBC: 4.61 MIL/uL (ref 3.87–5.11)
RDW: 15 % (ref 11.5–15.5)
WBC: 8.6 10*3/uL (ref 4.0–10.5)
nRBC: 0 % (ref 0.0–0.2)

## 2018-10-15 LAB — PROTIME-INR
INR: 1 (ref 0.8–1.2)
Prothrombin Time: 13 seconds (ref 11.4–15.2)

## 2018-10-15 LAB — DIFFERENTIAL
Abs Immature Granulocytes: 0.04 10*3/uL (ref 0.00–0.07)
Basophils Absolute: 0.1 10*3/uL (ref 0.0–0.1)
Basophils Relative: 1 %
Eosinophils Absolute: 0.1 10*3/uL (ref 0.0–0.5)
Eosinophils Relative: 2 %
Immature Granulocytes: 1 %
Lymphocytes Relative: 29 %
Lymphs Abs: 2.5 10*3/uL (ref 0.7–4.0)
Monocytes Absolute: 0.8 10*3/uL (ref 0.1–1.0)
Monocytes Relative: 9 %
Neutro Abs: 5.1 10*3/uL (ref 1.7–7.7)
Neutrophils Relative %: 58 %

## 2018-10-15 LAB — POTASSIUM: Potassium: 2.8 mmol/L — ABNORMAL LOW (ref 3.5–5.1)

## 2018-10-15 LAB — GLUCOSE, CAPILLARY
Glucose-Capillary: 106 mg/dL — ABNORMAL HIGH (ref 70–99)
Glucose-Capillary: 108 mg/dL — ABNORMAL HIGH (ref 70–99)
Glucose-Capillary: 115 mg/dL — ABNORMAL HIGH (ref 70–99)

## 2018-10-15 LAB — SARS CORONAVIRUS 2 (TAT 6-24 HRS): SARS Coronavirus 2: NEGATIVE

## 2018-10-15 LAB — APTT: aPTT: 27 seconds (ref 24–36)

## 2018-10-15 LAB — MAGNESIUM: Magnesium: 1.9 mg/dL (ref 1.7–2.4)

## 2018-10-15 MED ORDER — INSULIN ASPART 100 UNIT/ML ~~LOC~~ SOLN: 0.0000 [IU] | Freq: Every day | SUBCUTANEOUS | Status: DC

## 2018-10-15 MED ORDER — DULOXETINE HCL 30 MG PO CPEP
60.0000 mg | ORAL_CAPSULE | Freq: Every day | ORAL | Status: DC
  Administered 2018-10-15 – 2018-10-17 (×3): 60 mg via ORAL
  Filled 2018-10-15 (×3): qty 2

## 2018-10-15 MED ORDER — LABETALOL HCL 5 MG/ML IV SOLN: 10.0000 mg | Freq: Four times a day (QID) | INTRAVENOUS | Status: DC | PRN

## 2018-10-15 MED ORDER — SODIUM CHLORIDE 0.9% FLUSH
3.0000 mL | Freq: Once | INTRAVENOUS | Status: AC
  Administered 2018-10-15: 12:00:00 3 mL via INTRAVENOUS

## 2018-10-15 MED ORDER — INSULIN ASPART 100 UNIT/ML ~~LOC~~ SOLN
0.0000 [IU] | Freq: Three times a day (TID) | SUBCUTANEOUS | Status: DC
  Administered 2018-10-16 – 2018-10-18 (×6): 1 [IU] via SUBCUTANEOUS
  Filled 2018-10-15 (×6): qty 1

## 2018-10-15 MED ORDER — CLOPIDOGREL BISULFATE 75 MG PO TABS
75.0000 mg | ORAL_TABLET | Freq: Every day | ORAL | Status: DC
  Administered 2018-10-16 – 2018-10-18 (×3): 75 mg via ORAL
  Filled 2018-10-15 (×3): qty 1

## 2018-10-15 MED ORDER — POTASSIUM CHLORIDE 10 MEQ/100ML IV SOLN
10.0000 meq | INTRAVENOUS | Status: AC
  Administered 2018-10-15 (×3): 10 meq via INTRAVENOUS
  Filled 2018-10-15 (×5): qty 100

## 2018-10-15 MED ORDER — STROKE: EARLY STAGES OF RECOVERY BOOK
Freq: Once | Status: AC
  Administered 2018-10-15: 21:00:00

## 2018-10-15 MED ORDER — ACETAMINOPHEN 325 MG PO TABS
650.0000 mg | ORAL_TABLET | ORAL | Status: DC | PRN
  Administered 2018-10-17: 650 mg via ORAL
  Filled 2018-10-15 (×2): qty 2

## 2018-10-15 MED ORDER — ATORVASTATIN CALCIUM 80 MG PO TABS
80.0000 mg | ORAL_TABLET | Freq: Every day | ORAL | Status: DC
  Administered 2018-10-15 – 2018-10-17 (×3): 80 mg via ORAL
  Filled 2018-10-15: qty 1
  Filled 2018-10-15 (×2): qty 4
  Filled 2018-10-15 (×3): qty 1
  Filled 2018-10-15: qty 4

## 2018-10-15 MED ORDER — POTASSIUM CHLORIDE 10 MEQ/100ML IV SOLN
10.0000 meq | INTRAVENOUS | Status: DC
  Administered 2018-10-15 – 2018-10-16 (×3): 10 meq via INTRAVENOUS
  Filled 2018-10-15 (×3): qty 100

## 2018-10-15 MED ORDER — ACETAMINOPHEN 650 MG RE SUPP: 650.0000 mg | RECTAL | Status: DC | PRN

## 2018-10-15 MED ORDER — INFLUENZA VAC A&B SA ADJ QUAD 0.5 ML IM PRSY
0.5000 mL | PREFILLED_SYRINGE | INTRAMUSCULAR | Status: AC
  Administered 2018-10-16: 14:00:00 0.5 mL via INTRAMUSCULAR
  Filled 2018-10-15 (×2): qty 0.5

## 2018-10-15 MED ORDER — LEVOTHYROXINE SODIUM 112 MCG PO TABS
112.0000 ug | ORAL_TABLET | Freq: Every day | ORAL | Status: DC
  Administered 2018-10-16 – 2018-10-18 (×3): 112 ug via ORAL
  Filled 2018-10-15 (×3): qty 1

## 2018-10-15 MED ORDER — TOPIRAMATE 25 MG PO TABS
25.0000 mg | ORAL_TABLET | Freq: Two times a day (BID) | ORAL | Status: DC
  Administered 2018-10-15 – 2018-10-18 (×6): 25 mg via ORAL
  Filled 2018-10-15 (×7): qty 1

## 2018-10-15 MED ORDER — LEVETIRACETAM 500 MG PO TABS
500.0000 mg | ORAL_TABLET | Freq: Two times a day (BID) | ORAL | Status: DC
  Administered 2018-10-15 – 2018-10-18 (×6): 500 mg via ORAL
  Filled 2018-10-15 (×8): qty 1

## 2018-10-15 MED ORDER — LORAZEPAM 2 MG/ML IJ SOLN
1.0000 mg | Freq: Once | INTRAMUSCULAR | Status: AC
  Administered 2018-10-15: 1 mg via INTRAVENOUS
  Filled 2018-10-15: qty 1

## 2018-10-15 MED ORDER — ASPIRIN EC 325 MG PO TBEC
325.0000 mg | DELAYED_RELEASE_TABLET | Freq: Every day | ORAL | Status: DC
  Administered 2018-10-16 – 2018-10-18 (×3): 325 mg via ORAL
  Filled 2018-10-15 (×3): qty 1

## 2018-10-15 MED ORDER — ACETAMINOPHEN 160 MG/5ML PO SOLN
650.0000 mg | ORAL | Status: DC | PRN
  Filled 2018-10-15: qty 20.3

## 2018-10-15 MED ORDER — FUROSEMIDE 20 MG PO TABS
20.0000 mg | ORAL_TABLET | Freq: Two times a day (BID) | ORAL | Status: DC
  Administered 2018-10-15 – 2018-10-18 (×6): 20 mg via ORAL
  Filled 2018-10-15 (×6): qty 1

## 2018-10-15 MED ORDER — ENOXAPARIN SODIUM 40 MG/0.4ML ~~LOC~~ SOLN
40.0000 mg | SUBCUTANEOUS | Status: DC
  Administered 2018-10-15 – 2018-10-17 (×3): 40 mg via SUBCUTANEOUS
  Filled 2018-10-15 (×3): qty 0.4

## 2018-10-15 MED ORDER — CYANOCOBALAMIN 500 MCG PO TABS
500.0000 ug | ORAL_TABLET | Freq: Every day | ORAL | Status: DC
  Administered 2018-10-16 – 2018-10-18 (×3): 500 ug via ORAL
  Filled 2018-10-15 (×3): qty 1

## 2018-10-15 MED ORDER — GABAPENTIN 400 MG PO CAPS
800.0000 mg | ORAL_CAPSULE | Freq: Two times a day (BID) | ORAL | Status: DC
  Administered 2018-10-15 – 2018-10-18 (×6): 800 mg via ORAL
  Filled 2018-10-15 (×3): qty 2
  Filled 2018-10-15: qty 8
  Filled 2018-10-15: qty 2
  Filled 2018-10-15 (×2): qty 8
  Filled 2018-10-15 (×3): qty 2

## 2018-10-15 MED ORDER — GADOBUTROL 1 MMOL/ML IV SOLN
7.0000 mL | Freq: Once | INTRAVENOUS | Status: AC | PRN
  Administered 2018-10-15: 7 mL via INTRAVENOUS

## 2018-10-15 MED ORDER — NITROGLYCERIN 0.4 MG SL SUBL: 0.4000 mg | SUBLINGUAL_TABLET | SUBLINGUAL | Status: DC | PRN

## 2018-10-15 NOTE — ED Notes (Signed)
Pt reports today at 0800 she felt like her face became twisted and weakness to her entire L side, worse from baseline weakness to L arm from prior stroke. Pt also c/o numbness to her L side that is new from her baseline. Pt reports takes a baby aspirin at home. Reports LKW at approx 0800, noticed symptoms at approx 0900.  Pt states to neurologist that she has baseline weakness from previous stroke in May, noted to be weaker today that previously.

## 2018-10-15 NOTE — Progress Notes (Signed)
Inpatient Diabetes Program Recommendations  AACE/ADA: New Consensus Statement on Inpatient Glycemic Control (2015)  Target Ranges:  Prepandial:   less than 140 mg/dL      Peak postprandial:   less than 180 mg/dL (1-2 hours)      Critically ill patients:  140 - 180 mg/dL   Results for XURI, PURDOM (MRN [id number]) as of 10/15/2018 13:46  Ref. Range 10/15/2018 11:13  Glucose-Capillary Latest Ref Range: 70 - 99 mg/dL 106 (H)   Results for LYNA, ODEA (MRN [id number]) as of 10/15/2018 13:46  Ref. Range 06/14/2018 04:45  Hemoglobin A1C Latest Ref Range: 4.8 - 5.6 % 5.6    Admit with: Left-sided weakness concerning for acute stroke  History: DM, CVA, COPD  Home DM Meds: Glipizide/Metformin 5/500 mg 2 tablets in the AM and 1 tablet in the PM  Current Orders: Novolog Sensitive Correction Scale/ SSI (0-9 units) TID AC + HS     Current A1c pending.    Last A1c WNL 5.6% back in June 2020.  Orders for Novolog SSI placed.  CBG 106 at 11am today.    --Will follow patient during hospitalization--  Wyn Quaker RN, MSN, CDE Diabetes Coordinator Inpatient Glycemic Control Team Team Pager: [phone] (8a-5p)

## 2018-10-15 NOTE — Consult Note (Signed)
PHARMACY CONSULT NOTE - FOLLOW UP  Pharmacy Consult for Electrolyte Monitoring and Replacement   Recent Labs: Potassium (mmol/L)  Date Value  10/15/2018 2.8 (L)   Magnesium (mg/dL)  Date Value  06/14/2018 2.2   Calcium (mg/dL)  Date Value  10/15/2018 8.9   Albumin (g/dL)  Date Value  10/15/2018 4.0   Phosphorus (mg/dL)  Date Value  07/06/2017 3.8   Sodium (mmol/L)  Date Value  10/15/2018 142     Assessment: Pharmacy has been consulted to monitor/replenish electrolytes in this 73 yo female with hypokalemia being seen for possible stroke.  K=2.8  Goal of Therapy:  Electrolytes wnl's  Plan:  Will start KCL 74meq IV x 4 and recheck level @ 1800 per protocol.  Will also check Magnesium as an add-on lab and replenish if needed  Lu Duffel, PharmD, BCPS Clinical Pharmacist 10/15/2018 12:52 PM

## 2018-10-15 NOTE — ED Triage Notes (Signed)
Pt reports today at 0800 she felt like her face became twisted and weakness to her entire L side, worse from baseline weakness to L arm from prior stroke. Pt also c/o numbness to her L side that is new from her baseline. Pt reports takes a baby aspirin at home. Reports LKW at approx 0800, noticed symptoms at approx 0900.  Pt states to neurologist that she has baseline weakness from previous stroke in May, noted to be weaker today that previously.

## 2018-10-15 NOTE — ED Notes (Signed)
Admitting MD at bedside at this time.

## 2018-10-15 NOTE — Progress Notes (Signed)
SLP Cancellation Note  Patient Details Name: Samantha Aguirre MRN: [id number] DOB: September 16, 1945   Cancelled treatment:       Reason Eval/Treat Not Completed: SLP screened, no needs identified, will sign off(chart reviewed; consulted NSG then met w/ pt in room). Pt denied any difficulty swallowing and is currently on a regular diet; tolerates swallowing pills w/ water per NSG. Pt conversed at conversational level w/out any language deficits noted; pt denied any new speech-language deficits. She ordered her Dinner meal w/ SLP from verbal choices given by SLP; reviewing the menu w/ SLP. Noted Head CT results.  No further skilled ST services indicated as pt appears at her baseline. Pt agreed. NSG to reconsult if any change in status while admittd.     Orinda Kenner, MS, CCC-SLP Watson,Katherine 10/15/2018, 3:55 PM

## 2018-10-15 NOTE — ED Notes (Signed)
Patient transported to CT 

## 2018-10-15 NOTE — ED Notes (Signed)
Pt arrived to CT. 

## 2018-10-15 NOTE — ED Notes (Signed)
Pt reporting to neurologist and EDP that her speech sounds normal then reports that "it sounds a little bit off, it's not normal but it sounds a little bit off".

## 2018-10-15 NOTE — ED Provider Notes (Signed)
[facility] Emergency Department Provider Note  ____________________________________________   First MD Initiated Contact with Patient 10/15/18 1117     (approximate)  I have reviewed the triage vital signs and the nursing notes.   HISTORY  Chief Complaint Weakness    HPI Samantha Aguirre is a 73 y.o. female with A. fib, diabetes, prior stroke who presents for left-sided weakness.  Patient suffered a CVA with residual left-sided weakness, little bit of facial droop.  This was in May 2020.  Patient do not have anything on her MRI.  Patient also had an EEG that was negative.  According the discharge note symptoms resolved at the time of discharge although she does have some chronic left-sided weakness.  Patient states that she woke up this morning with her baseline self.  Around 8 AM she developed left-sided increased weakness and may be a little bit of sensation changes on the left side as well as facial droop on the left side.  These have been constant, mild, nothing makes it better or worse.          Past Medical History:  Diagnosis Date  . Acid reflux   . Arthritis    Bilateral knees, hands, feet  . Asthma   . Atrial fibrillation ([place])   . Cancer ([place])   . CHF (congestive heart failure) (HCC)    Diastolic CHF  . COPD (chronic obstructive pulmonary disease) ([place])   . Coronary artery disease   . Diabetes mellitus without complication ([place])   . Frequent headaches   . GI bleed   . HLD (hyperlipidemia)   . Hypertension   . Seizures ([place])   . Skin cancer 2015   Suspected basal cell carcinoma on nose, surgically removed  . Stroke ([place]) 04/2017  . Thyroid disease   . Tremors of nervous system     Patient Active Problem List   Diagnosis Date Noted  . Stroke ([place]) 06/13/2018  . Hypotension 03/18/2018  . Malnutrition of moderate degree 03/16/2018  . Closed fracture of lumbar vertebral body ([place]) 03/15/2018  . Speaking difficulty 03/13/2018  .  TIA (transient ischemic attack) 09/22/2017  . PAF (paroxysmal atrial fibrillation) ([place])   . Acute CVA (cerebrovascular accident) ([place]) 07/30/2017  . GI bleed 07/08/2017  . Anemia 07/08/2017  . Angiodysplasia of stomach and duodenum with hemorrhage   . CVA (cerebral vascular accident) ([place]) 07/06/2017  . Stroke (cerebrum) ([place])   . Goals of care, counseling/discussion   . Palliative care encounter   . COPD exacerbation ([place]) 04/03/2017  . Flu 03/27/2017  . Iron deficiency anemia due to chronic blood loss   . Migraine 02/13/2017  . Cataracts, bilateral 01/15/2017  . Chronic venous insufficiency 10/06/2016  . Recurrent major depressive disorder, in partial remission ([place]) 10/06/2016  . Left-sided weakness 10/03/2016  . Chest pain 02/27/2016  . History of stroke 02/19/2016  . Thumb pain, left 01/22/2016  . Traumatic closed nondisplaced fracture of base of metacarpal bone of left thumb 01/22/2016  . Colon cancer screening 11/27/2015  . Depression 11/14/2015  . Coronary artery disease 11/13/2015  . Hypertension 11/13/2015  . History of skin cancer 11/13/2015  . Osteoporosis 11/13/2015  . Chronic low back pain 11/13/2015  . Left medial knee pain 11/13/2015  . Osteoarthritis of multiple joints 11/13/2015  . Other specified hypothyroidism 08/16/2015  . Controlled type 2 diabetes mellitus with diabetic neuropathy ([place]) 08/16/2015  . COPD (chronic obstructive pulmonary disease) ([place]) 08/16/2015  . Tobacco abuse 08/16/2015  .  HLD (hyperlipidemia) 08/16/2015  . History of CHF (congestive heart failure) 08/16/2015  . Seizure disorder [facility])     Past Surgical History:  Procedure Laterality Date  . ABDOMINAL HYSTERECTOMY  1976  . APPENDECTOMY  1980  . BACK SURGERY    . CAROTID ENDARTERECTOMY    . CHOLECYSTECTOMY  1980  . COLONOSCOPY N/A 02/20/2017   Procedure: COLONOSCOPY;  Surgeon: Lin Landsman, MD;  Location: [facility] ENDOSCOPY;  Service: Gastroenterology;  Laterality: N/A;  .  ESOPHAGOGASTRODUODENOSCOPY N/A 02/20/2017   Procedure: ESOPHAGOGASTRODUODENOSCOPY (EGD);  Surgeon: Lin Landsman, MD;  Location[facility] ENDOSCOPY;  Service: Gastroenterology;  Laterality: N/A;  . ESOPHAGOGASTRODUODENOSCOPY (EGD) WITH PROPOFOL N/A 07/08/2017   Procedure: ESOPHAGOGASTRODUODENOSCOPY (EGD) WITH PROPOFOL;  Surgeon: [name], MD;  Location: [facility] ENDOSCOPY;  Service: Endoscopy;  Laterality: N/A;  . [place]  . GIVENS CAPSULE STUDY N/A 07/09/2017   Procedure: GIVENS CAPSULE STUDY;  Surgeon: Jonathon Bellows, MD;  Location: [facility] ENDOSCOPY;  Service: Gastroenterology;  Laterality: N/A;  . KNEE SURGERY     left  . KYPHOPLASTY N/A 03/16/2018   Procedure: KYPHOPLASTY, L5;  Surgeon: Hessie Knows, MD;  Location: [facility] ORS;  Service: Orthopedics;  Laterality: N/A;  . ROTATOR CUFF REPAIR     right  . TONSILLECTOMY  1950    Prior to Admission medications   Medication Sig Start Date End Date Taking? Authorizing Provider  albuterol (PROVENTIL HFA;VENTOLIN HFA) 108 (90 Base) MCG/ACT inhaler Inhale 2 puffs into the lungs every 6 (six) hours as needed for wheezing or shortness of breath. 10/08/16   Karamalegos, Devonne Doughty, DO  aspirin (ASPIRIN CHILDRENS) 81 MG chewable tablet Chew 1 tablet (81 mg total) by mouth daily. 03/17/18   Bettey Costa, MD  atorvastatin (LIPITOR) 80 MG tablet Take 1 tablet (80 mg total) by mouth daily at 6 PM. 09/24/16   Karamalegos, Devonne Doughty, DO  colestipol (COLESTID) 1 g tablet Take 2 g by mouth 2 (two) times daily. 10/13/17   [provider]  diclofenac sodium (VOLTAREN) 1 % GEL Apply 2 g topically 4 (four) times daily. 01/11/16   Daymon Larsen, MD  DULoxetine (CYMBALTA) 60 MG capsule Take 1 capsule (60 mg total) by mouth at bedtime. 09/24/16   Karamalegos, Devonne Doughty, DO  ferrous sulfate 325 (65 FE) MG EC tablet Take 1 tablet (325 mg total) by mouth 2 (two) times daily with a meal. Patient taking differently: Take 325 mg by mouth daily.  02/21/17    Hillary Bow, MD  fluticasone (FLONASE) 50 MCG/ACT nasal spray Place 1 spray into both nostrils daily. 10/08/16   Karamalegos, Devonne Doughty, DO  gabapentin (NEURONTIN) 800 MG tablet Take 1 tablet (800 mg total) by mouth 2 (two) times daily. 09/24/16   Karamalegos, Devonne Doughty, DO  glipiZIDE-metformin (METAGLIP) 5-500 MG tablet Take 1 tablet by mouth 2 (two) times daily before a meal. Patient taking differently: Take 1-2 tablets by mouth 2 (two) times daily before a meal. 2 tablets in the morning and 1 tablet in the evening 04/02/17   Karamalegos, Devonne Doughty, DO  levETIRAcetam (KEPPRA) 500 MG tablet Take 1 tablet (500 mg total) by mouth 2 (two) times daily. 09/19/15   Epifanio Lesches, MD  levothyroxine (SYNTHROID, LEVOTHROID) 112 MCG tablet Take 1 tablet (112 mcg total) by mouth daily before breakfast. 09/24/17   Geradine Girt, DO  lisinopril (PRINIVIL,ZESTRIL) 10 MG tablet Take 2 tablets (20 mg total) by mouth daily. 03/19/18   Bettey Costa, MD  nitroGLYCERIN (NITROSTAT) 0.4  MG SL tablet Place 1 tablet (0.4 mg total) under the tongue every 5 (five) minutes as needed for chest pain. 02/28/16   Bettey Costa, MD  omeprazole (PRILOSEC) 20 MG capsule Take 20 mg by mouth 2 (two) times daily. 09/02/17   [provider]  potassium chloride (K-DUR) 10 MEQ tablet Take 1 tablet (10 mEq total) by mouth daily. 06/22/18   Earleen Newport, MD  sucralfate (CARAFATE) 1 g tablet TAKE (1) TABLET BY MOUTH FOUR TIMES A DAY Patient taking differently: Take 1 g by mouth 4 (four) times daily.  03/26/17   Karamalegos, Devonne Doughty, DO  umeclidinium bromide (INCRUSE ELLIPTA) 62.5 MCG/INH AEPB Inhale 1 puff into the lungs daily. 11/13/15   Karamalegos, Devonne Doughty, DO  vitamin B-12 (CYANOCOBALAMIN) 500 MCG tablet Take 1 tablet by mouth daily.  06/29/17   [provider]    Allergies Ivp dye [iodinated diagnostic agents], Methotrexate, Morphine and related, Mushroom extract complex, Atenolol, Percocet  [oxycodone-acetaminophen], Percodan [oxycodone-aspirin], Tramadol, Verapamil, and Oxycodone hcl  Family History  Problem Relation Age of Onset  . Breast cancer Mother   . Heart disease Mother   . Stroke Mother   . Cancer Mother   . COPD Mother   . Diabetes Mother   . Heart disease Father   . Diabetes Father   . Stroke Father   . Alcohol abuse Sister   . Drug abuse Sister   . Stroke Sister   . Cancer Sister   . Mental illness Sister   . Heart disease Brother   . Arthritis Brother   . Diabetes Brother   . Heart disease Maternal Grandfather   . Heart disease Paternal Grandfather     Social History Social History   Tobacco Use  . Smoking status: Current Every Day Smoker    Packs/day: 1.00    Years: 53.00    Pack years: 53.00    Types: Cigarettes  . Smokeless tobacco: Never Used  Substance Use Topics  . Alcohol use: No  . Drug use: No      Review of Systems Constitutional: No fever/chills Eyes: No visual changes. ENT: No sore throat. Cardiovascular: Denies chest pain. Respiratory: Denies shortness of breath. Gastrointestinal: No abdominal pain.  No nausea, no vomiting.  No diarrhea.  No constipation. Genitourinary: Negative for dysuria. Musculoskeletal: Negative for back pain. Skin: Negative for rash. Neurological: Positive weakness, sensation changes, facial asymmetry All other ROS negative ____________________________________________   PHYSICAL EXAM:  VITAL SIGNS: Blood pressure 109/60, pulse 66, temperature 98.1 F (36.7 C), temperature source Oral, resp. rate 20, weight 79.8 kg, SpO2 97 %.   Constitutional: Alert and oriented. Well appearing and in no acute distress. Eyes: Conjunctivae are normal. EOMI. Head: Atraumatic. Nose: No congestion/rhinnorhea. Mouth/Throat: Mucous membranes are moist.   Neck: No stridor. Trachea Midline. FROM Cardiovascular: Normal rate, regular rhythm. Grossly normal heart sounds.  Good peripheral circulation.  Respiratory: Normal respiratory effort.  No retractions. Lungs CTAB. Gastrointestinal: Soft and nontender. No distention. No abdominal bruits.  Musculoskeletal: No lower extremity tenderness nor edema.  No joint effusions. Neurologic: Mild weakness on the left hand.  This seems at baseline.  May be slightly increased weakness of the left leg but still able to lift off the bed.  Decreased sensation on the left.  Mild facial droop on the left.  NIH stroke scale of 4-5 Skin:  Skin is warm, dry and intact. No rash noted. Psychiatric: Mood and affect are normal. Speech and behavior are normal. GU: Deferred  ____________________________________________   LABS (all labs ordered are listed, but only abnormal results are displayed)  Labs Reviewed  GLUCOSE, CAPILLARY - Abnormal; Notable for the following components:      Result Value   Glucose-Capillary 106 (*)    All other components within normal limits  COMPREHENSIVE METABOLIC PANEL - Abnormal; Notable for the following components:   Potassium 2.8 (*)    Glucose, Bld 107 (*)    BUN 6 (*)    All other components within normal limits  SARS CORONAVIRUS 2 (TAT 6-24 HRS)  PROTIME-INR  APTT  CBC  DIFFERENTIAL  MAGNESIUM  POTASSIUM  CBG MONITORING, ED   ____________________________________________   ED ECG REPORT I, Vanessa [place], the attending physician, personally viewed and interpreted this ECG.  EKG is normal sinus rate of 76, no ST elevation no T wave inversion, normal intervals ____________________________________________  RADIOLOGY Robert Bellow, personally viewed and evaluated these images (plain radiographs) as part of my medical decision making, as well as reviewing the written report by the radiologist.  ED MD interpretation: CT head was negative for acute stroke.  Official radiology report(s): Dg Hip Unilat W Or Wo Pelvis 2-3 Views Left  Result Date: 10/15/2018 CLINICAL DATA:  Left hip pain after fall 5 days ago.  EXAM: DG HIP (WITH OR WITHOUT PELVIS) 2-3V LEFT COMPARISON:  Radiographs of March 17, 2018. FINDINGS: There is no evidence of hip fracture or dislocation. There is no evidence of arthropathy or other focal bone abnormality. IMPRESSION: Negative. Electronically Signed   By: Marijo Conception M.D.   On: 10/15/2018 12:19   Ct Head Code Stroke Wo Contrast  Result Date: 10/15/2018 CLINICAL DATA:  Code stroke. Facial paralysis. Possible stroke. Worsening left facial droop EXAM: CT HEAD WITHOUT CONTRAST TECHNIQUE: Contiguous axial images were obtained from the base of the skull through the vertex without intravenous contrast. COMPARISON:  CT and MRI head 06/13/2018 FINDINGS: Brain: Chronic infarct left posterior parietal lobe unchanged. Chronic microvascular ischemic change in the white matter unchanged. Mild atrophy. Negative for hydrocephalus. Chronic infarct in the right anterior corpus callosum unchanged. Chronic infarcts in the cerebellum bilaterally unchanged. Negative for acute infarct, hemorrhage, mass.  No midline shift. Vascular: Normal arterial flow voids. Skull: Negative Sinuses/Orbits: Negative Other: None ASPECTS ([place] Stroke Program Early CT Score) - Ganglionic level infarction (caudate, lentiform nuclei, internal capsule, insula, M1-M3 cortex): 7 - Supraganglionic infarction (M4-M6 cortex): 3 Total score (0-10 with 10 being normal): 10 IMPRESSION: 1. No acute abnormality 2. Atrophy and extensive chronic ischemic changes, stable from the prior study. 3. ASPECTS is 10 4. These results were called by telephone at the time of interpretation on 10/15/2018 at 11:38 am to provider [facility] , who verbally acknowledged these results. Electronically Signed   By: Franchot Gallo M.D.   On: 10/15/2018 11:39    ____________________________________________   PROCEDURES  Procedure(s) performed (including Critical Care):  Procedures   ____________________________________________   INITIAL IMPRESSION /  ASSESSMENT AND PLAN / ED COURSE  Skarleth Ballejos was evaluated in Emergency Department on 10/15/2018 for the symptoms described in the history of present illness. She was evaluated in the context of the global COVID-19 pandemic, which necessitated consideration that the patient might be at risk for infection with the SARS-CoV-2 virus that causes COVID-19. Institutional protocols and algorithms that pertain to the evaluation of patients at risk for COVID-19 are in a state of rapid change based on information released by regulatory bodies including the CDC and federal  and state organizations. These policies and algorithms were followed during the patient's care in the ED.    Patient is a 73 year old who present for stroke code within the window although her deficits are minor and very similar to her prior presentation.  This could be new ischemic stroke versus recrudescence.  Patient did have a fall a few days ago and hit her left hip that could be increasing her pain causing increased weakness.  Will get imaging of the left hip.  No such symptoms suggest UTI.  Will get basic labs to evaluate for electrolyte abnormalities.   Discussed with Dr. Irish Elders and wanted to hold off on TPA at this time given deficits are only slightly worse than her baseline deficits.  We discussed with the patient as well about the benefits and risk of TPA.  She agreed with holding off on TPA at this time she did not want to risk of bleeding.  Patient will be admitted to medicine for stroke work-up.       ____________________________________________   FINAL CLINICAL IMPRESSION(S) / ED DIAGNOSES   Final diagnoses:  Weakness      MEDICATIONS GIVEN DURING THIS VISIT:  Medications  sodium chloride flush (NS) 0.9 % injection 3 mL (has no administration in time range)     ED Discharge Orders    None       Note:  This document was prepared using Dragon voice recognition software and may include unintentional  dictation errors.   Vanessa Pitman, MD 10/15/18 [phone]

## 2018-10-15 NOTE — Progress Notes (Signed)
Family Meeting Note  Advance Directive:no  Today a meeting took place with the Patient.   The following clinical team members were present during this meeting:MD  The following were discussed:Patient's diagnosis: cva Copd tob abuse  , Patient's progosis: > 12 months and Goals for treatment: DNR  Additional follow-up to be provided: needs outpatient PC consult at discharge Chaplain consult  Time spent during discussion:16 minutes  Bettey Costa, MD

## 2018-10-15 NOTE — Progress Notes (Signed)
CODE STROKE- PHARMACY COMMUNICATION   Time CODE STROKE called/page received:1117  Time response to CODE STROKE was made (in person or via phone): 1125  Time Stroke Kit retrieved from [place] (only if needed): 1125 (not given)  Name of Provider/Nurse contacted: responded to room w/ EDP, RN and neurologist    Tawnya Crook ,PharmD Clinical Pharmacist  10/15/2018  11:52 AM

## 2018-10-15 NOTE — ED Notes (Signed)
CODE STROKE CALLED TO [phone] 

## 2018-10-15 NOTE — Consult Note (Addendum)
Referring Physician:  Dr. Jari Pigg     Chief Complaint: L sided weakness   HPI: Samantha Aguirre is an 73 y.o. female with hx of CHF, DM, HTN, prior stroke with residual L sided weakness. At baseline patient walk with a walker or cane due to residual L sided weakness. Pt also states she fell last weekend and has L hip pain.  She reports increased numbness on L side and worse LLE weakness. Was on ASA 81mg  at home.   Last known well at [place] chronic small vessel changes and lacunar infarcts.   Date last known well: Date: 10/15/2018 Time last known well: Time: 08:00 tPA Given: No: close to baseline and risk for TPA likely higher then current condition.  Past Medical History:  Diagnosis Date  . Acid reflux   . Arthritis    Bilateral knees, hands, feet  . Asthma   . Atrial fibrillation ([place])   . Cancer ([place])   . CHF (congestive heart failure) (H[facility])    Diastolic CHF  . COPD (chronic obstructive pulmonary disease) ([place])   . Coronary artery disease   . Diabetes mellitus without complication ([place])   . Frequent headaches   . GI bleed   . HLD (hyperlipidemia)   . Hypertension   . Seizures ([place])   . Skin cancer 2015   Suspected basal cell carcinoma on nose, surgically removed  . Stroke ([place]) 04/2017  . Thyroid disease   . Tremors of nervous system     Past Surgical History:  Procedure Laterality Date  . ABDOMINAL HYSTERECTOMY  1976  . APPENDECTOMY  1980  . BACK SURGERY    . CAROTID ENDARTERECTOMY    . CHOLECYSTECTOMY  1980  . COLONOSCOPY N/A 02/20/2017   Procedure: COLONOSCOPY;  Surgeon: Lin Landsman, MD;  Location[facility] ENDOSCOPY;  Service: Gastroenterology;  Laterality: N/A;  . ESOPHAGOGASTRODUODENOSCOPY N/A 02/20/2017   Procedure: ESOPHAGOGASTRODUODENOSCOPY (EGD);  Surgeon: Lin Landsman, MD;  Location: [facility] ENDOSCOPY;  Service: Gastroenterology;  Laterality: N/A;  . ESOPHAGOGASTRODUODENOSCOPY (EGD) WITH PROPOFOL N/A 07/08/2017   Procedure: ESOPHAGOGASTRODUODENOSCOPY  (EGD) WITH PROPOFOL;  Surgeon: [name], MD;  Location: [facility] ENDOSCOPY;  Service: Endoscopy;  Laterality: N/A;  . [place]  . GIVENS CAPSULE STUDY N/A 07/09/2017   Procedure: GIVENS CAPSULE STUDY;  Surgeon: Jonathon Bellows, MD;  Location: [facility] ENDOSCOPY;  Service: Gastroenterology;  Laterality: N/A;  . KNEE SURGERY     left  . KYPHOPLASTY N/A 03/16/2018   Procedure: KYPHOPLASTY, L5;  Surgeon: Hessie Knows, MD;  Location: [facility] ORS;  Service: Orthopedics;  Laterality: N/A;  . ROTATOR CUFF REPAIR     right  . TONSILLECTOMY  1950    Family History  Problem Relation Age of Onset  . Breast cancer Mother   . Heart disease Mother   . Stroke Mother   . Cancer Mother   . COPD Mother   . Diabetes Mother   . Heart disease Father   . Diabetes Father   . Stroke Father   . Alcohol abuse Sister   . Drug abuse Sister   . Stroke Sister   . Cancer Sister   . Mental illness Sister   . Heart disease Brother   . Arthritis Brother   . Diabetes Brother   . Heart disease Maternal Grandfather   . Heart disease Paternal Grandfather    Social History:  reports that she has been smoking cigarettes. She has a 53.00 pack-year smoking history. She has never used smokeless tobacco. She  reports that she does not drink alcohol or use drugs.  Allergies:  Allergies  Allergen Reactions  . Ivp Dye [Iodinated Diagnostic Agents] Itching    Pt injected with IV contrast.  5 min after injection pt complained of itching behind ear and on her abd.  . Methotrexate Rash  . Morphine And Related Other (See Comments)    "stops my breathing" NEAR-RESPIRATORY ARREST  . Mushroom Extract Complex Anaphylaxis    Made patient "deathly sick"  . Atenolol Other (See Comments)    "MD took me off of it bc it was doing something wrong" Made patient HYPOTENSIVE  . Percocet [Oxycodone-Acetaminophen] Nausea And Vomiting and Other (See Comments)    Stomach pains  . Percodan [Oxycodone-Aspirin] Nausea And Vomiting  and Other (See Comments)    Stomach pains  . Tramadol Nausea Only  . Verapamil Other (See Comments)    " MD told me this was screwing me up" MAKES PATIENT HYPOTENSIVE  . Oxycodone Hcl     Medications: I have reviewed the patient's current medications.    General ROS: negative for - chills, fatigue, fever, night sweats, weight gain or weight loss Psychological ROS: negative for - behavioral disorder, hallucinations, memory difficulties, mood swings or suicidal ideation Ophthalmic ROS: negative for - blurry vision, double vision, eye pain or loss of vision ENT ROS: negative for - epistaxis, nasal discharge, oral lesions, sore throat, tinnitus or vertigo Allergy and Immunology ROS: negative for - hives or itchy/watery eyes Hematological and Lymphatic ROS: negative for - bleeding problems, bruising or swollen lymph nodes Endocrine ROS: negative for - galactorrhea, hair pattern changes, polydipsia/polyuria or temperature intolerance Respiratory ROS: negative for - cough, hemoptysis, shortness of breath or wheezing Cardiovascular ROS: negative for - chest pain, dyspnea on exertion, edema or irregular heartbeat Gastrointestinal ROS: negative for - abdominal pain, diarrhea, hematemesis, nausea/vomiting or stool incontinence Genito-Urinary ROS: negative for - dysuria, hematuria, incontinence or urinary frequency/urgency Musculoskeletal ROS: negative for - joint swelling or muscular weakness Neurological ROS: as noted in HPI Dermatological ROS: negative for rash and skin lesion changes  Physical Examination: Blood pressure 127/64, pulse 75, temperature 98.1 F (36.7 C), temperature source Oral, resp. rate (!) 25, weight 79.8 kg, SpO2 95 %.   Neurological Examination   Mental Status: Alert, oriented, thought content appropriate.  Speech fluent without evidence of aphasia.  Able to follow 3 step commands without difficulty. Cranial Nerves: II: Discs flat bilaterally; Visual fields grossly  normal, pupils equal, round, reactive to light and accommodation III,IV, VI: ptosis not present, extra-ocular motions intact bilaterally V,VII: smile symmetric, facial light touch sensation normal bilaterally VIII: hearing normal bilaterally IX,X: gag reflex present XI: bilateral shoulder shrug XII: midline tongue extension Motor: Right : Upper extremity   4/5    Left:     Upper extremity   3/5  Lower extremity   5/5     Lower extremity   5/5 Tone and bulk:normal tone throughout; no atrophy noted Sensory: Pinprick and light touch intact throughout, bilaterally Deep Tendon Reflexes: 1+ and symmetric throughout Plantars: Right: downgoing   Left: downgoing Cerebellar: normal finger-to-nose, normal rapid alternating movements and normal heel-to-shin test Gait: not tested       Laboratory Studies:  Basic Metabolic Panel: Recent Labs  Lab 10/15/18 1118  NA 142  K 2.8*  CL 105  CO2 24  GLUCOSE 107*  BUN 6*  CREATININE 0.68  CALCIUM 8.9    Liver Function Tests: Recent Labs  Lab 10/15/18 1118  AST  24  ALT 20  ALKPHOS 120  BILITOT 0.7  PROT 7.1  ALBUMIN 4.0   No results for input(s): LIPASE, AMYLASE in the last 168 hours. No results for input(s): AMMONIA in the last 168 hours.  CBC: Recent Labs  Lab 10/15/18 1118  WBC 8.6  NEUTROABS 5.1  HGB 13.3  HCT 40.7  MCV 88.3  PLT 299    Cardiac Enzymes: No results for input(s): CKTOTAL, CKMB, CKMBINDEX, TROPONINI in the last 168 hours.  BNP: Invalid input(s): POCBNP  CBG: Recent Labs  Lab 10/15/18 1113  GLUCAP 106*    Microbiology: Results for orders placed or performed during the hospital encounter of 06/13/18  SARS Coronavirus 2 (CEPHEID - Performed in [name] hospital lab), Hosp Order     Status: None   Collection Time: 06/13/18  1:04 PM   Specimen: Nasopharyngeal  Result Value Ref Range Status   SARS Coronavirus 2 NEGATIVE NEGATIVE Final    Comment: (NOTE) If result is NEGATIVE SARS-CoV-2  target nucleic acids are NOT DETECTED. The SARS-CoV-2 RNA is generally detectable in upper and lower  respiratory specimens during the acute phase of infection. The lowest  concentration of SARS-CoV-2 viral copies this assay can detect is 250  copies / mL. A negative result does not preclude SARS-CoV-2 infection  and should not be used as the sole basis for treatment or other  patient management decisions.  A negative result may occur with  improper specimen collection / handling, submission of specimen other  than nasopharyngeal swab, presence of viral mutation(s) within the  areas targeted by this assay, and inadequate number of viral copies  (<250 copies / mL). A negative result must be combined with clinical  observations, patient history, and epidemiological information. If result is POSITIVE SARS-CoV-2 target nucleic acids are DETECTED. The SARS-CoV-2 RNA is generally detectable in upper and lower  respiratory specimens dur ing the acute phase of infection.  Positive  results are indicative of active infection with SARS-CoV-2.  Clinical  correlation with patient history and other diagnostic information is  necessary to determine patient infection status.  Positive results do  not rule out bacterial infection or co-infection with other viruses. If result is PRESUMPTIVE POSTIVE SARS-CoV-2 nucleic acids MAY BE PRESENT.   A presumptive positive result was obtained on the submitted specimen  and confirmed on repeat testing.  While 2019 novel coronavirus  (SARS-CoV-2) nucleic acids may be present in the submitted sample  additional confirmatory testing may be necessary for epidemiological  and / or clinical management purposes  to differentiate between  SARS-CoV-2 and other Sarbecovirus currently known to infect humans.  If clinically indicated additional testing with an alternate test  methodology [id number]) is advised. The SARS-CoV-2 RNA is generally  detectable in upper and lower  respiratory sp ecimens during the acute  phase of infection. The expected result is Negative. Fact Sheet for Patients:  StrictlyIdeas.no Fact Sheet for Healthcare Providers: BankingDealers.co.za This test is not yet approved or cleared by the [place] FDA and has been authorized for detection and/or diagnosis of SARS-CoV-2 by FDA under an Emergency Use Authorization (EUA).  This EUA will remain in effect (meaning this test can be used) for the duration of the COVID-19 declaration under Section 564(b)(1) of the Act, 21 U.S.C. section 360bbb-3(b)(1), unless the authorization is terminated or revoked sooner. Performed at [facility], [address], [place], [place] [address]   MRSA PCR Screening     Status: None   Collection Time:  06/14/18 12:36 PM   Specimen: Nasal Mucosa; Nasopharyngeal  Result Value Ref Range Status   MRSA by PCR NEGATIVE NEGATIVE Final    Comment:        The GeneXpert MRSA Assay (FDA approved for NASAL specimens only), is one component of a comprehensive MRSA colonization surveillance program. It is not intended to diagnose MRSA infection nor to guide or monitor treatment for MRSA infections. Performed at [facility], [address] [place], [place] [address]     Coagulation Studies: Recent Labs    10/15/18 1118  LABPROT 13.0  INR 1.0    Urinalysis: No results for input(s): COLORURINE, LABSPEC, PHURINE, GLUCOSEU, HGBUR, BILIRUBINUR, KETONESUR, PROTEINUR, UROBILINOGEN, NITRITE, LEUKOCYTESUR in the last 168 hours.  Invalid input(s): APPERANCEUR  Lipid Panel:    Component Value Date/Time   CHOL 188 06/14/2018 0445   TRIG 204 (H) 06/14/2018 0445   HDL 40 (L) 06/14/2018 0445   CHOLHDL 4.7 06/14/2018 0445   VLDL 41 (H) 06/14/2018 0445   LDLCALC 107 (H) 06/14/2018 0445    HgbA1C:  Lab Results  Component Value Date   HGBA1C 5.6 06/14/2018    Urine Drug Screen:       Component Value Date/Time   LABOPIA NONE DETECTED 03/13/2018 0943   LABOPIA NONE DETECTED 09/22/2017 1940   COCAINSCRNUR NONE DETECTED 03/13/2018 0943   LABBENZ NONE DETECTED 03/13/2018 0943   LABBENZ NONE DETECTED 09/22/2017 1940   AMPHETMU NONE DETECTED 03/13/2018 0943   AMPHETMU NONE DETECTED 09/22/2017 1940   THCU NONE DETECTED 03/13/2018 0943   THCU NONE DETECTED 09/22/2017 1940   LABBARB NONE DETECTED 03/13/2018 0943   LABBARB NONE DETECTED 09/22/2017 1940    Alcohol Level: No results for input(s): ETH in the last 168 hours.  Other results: EKG: normal EKG, normal sinus rhythm, unchanged from previous tracings.  Imaging: Ct Head Code Stroke Wo Contrast  Result Date: 10/15/2018 CLINICAL DATA:  Code stroke. Facial paralysis. Possible stroke. Worsening left facial droop EXAM: CT HEAD WITHOUT CONTRAST TECHNIQUE: Contiguous axial images were obtained from the base of the skull through the vertex without intravenous contrast. COMPARISON:  CT and MRI head 06/13/2018 FINDINGS: Brain: Chronic infarct left posterior parietal lobe unchanged. Chronic microvascular ischemic change in the white matter unchanged. Mild atrophy. Negative for hydrocephalus. Chronic infarct in the right anterior corpus callosum unchanged. Chronic infarcts in the cerebellum bilaterally unchanged. Negative for acute infarct, hemorrhage, mass.  No midline shift. Vascular: Normal arterial flow voids. Skull: Negative Sinuses/Orbits: Negative Other: None ASPECTS ([place] Stroke Program Early CT Score) - Ganglionic level infarction (caudate, lentiform nuclei, internal capsule, insula, M1-M3 cortex): 7 - Supraganglionic infarction (M4-M6 cortex): 3 Total score (0-10 with 10 being normal): 10 IMPRESSION: 1. No acute abnormality 2. Atrophy and extensive chronic ischemic changes, stable from the prior study. 3. ASPECTS is 10 4. These results were called by telephone at the time of interpretation on 10/15/2018 at 11:38 am to provider  [facility] , who verbally acknowledged these results. Electronically Signed   By: Franchot Gallo M.D.   On: 10/15/2018 11:39    Assessment: 73 y.o. female  with hx of CHF, DM, HTN, prior stroke with residual L sided weakness. At baseline patient walk with a walker or cane due to residual L sided weakness. Pt also states she fell last weekend and has L hip pain.  She reports increased numbness on L side and worse LLE weakness. Was on ASA 81mg  at home.   Last known well at [place].  No TPA as minimal new weakness that is off from baseline  Stroke Risk Factors - diabetes mellitus, family history and hypertension  Plan: 1. HgbA1c, fasting lipid panel 2. MRI, MRA  of the brain without contrast 3. PT consult, OT consult, Speech consult 4. Echocardiogram 6. Prophylactic therapy-Antiplatelet med: Aspirin - dose 325 7. NPO until RN stroke swallow screen 8. Telemetry monitoring 9. Frequent neuro checks 10. Will likely need ativan for MRI 11. CXR L hip as recent fall on it and pain   10/15/2018, 12:07 PM

## 2018-10-15 NOTE — H&P (Signed)
[facility] [place] NAME: Samantha Aguirre    MR#:  [id number]  DATE OF BIRTH:  03/04/1945  DATE OF ADMISSION:  10/15/2018  PRIMARY CARE PHYSICIAN: Tracie Harrier, MD   REQUESTING/REFERRING PHYSICIAN: dr Jari Pigg  CHIEF COMPLAINT:   Left sided weakness HISTORY OF PRESENT ILLNESS:  Samantha Aguirre  is a 73 y.o. female with a known history of previous CVA with residual left-sided weakness, chronic diastolic heart failure and COPD who presents to the emergency room due to left-sided weakness. Patient walks with a walker at baseline due to her left-sided weakness.  Patient reports that this morning she had increased left-sided weakness especially left lower extremity weakness.  Patient has been evaluated by neurology.  Patient is close to baseline and risk for TPA according to her neurologist.  PAST MEDICAL HISTORY:   Past Medical History:  Diagnosis Date  . Acid reflux   . Arthritis    Bilateral knees, hands, feet  . Asthma   . Atrial fibrillation ([place])   . Cancer ([place])   . CHF (congestive heart failure) (HCC)    Diastolic CHF  . COPD (chronic obstructive pulmonary disease) ([place])   . Coronary artery disease   . Diabetes mellitus without complication ([place])   . Frequent headaches   . GI bleed   . HLD (hyperlipidemia)   . Hypertension   . Seizures ([place])   . Skin cancer 2015   Suspected basal cell carcinoma on nose, surgically removed  . Stroke ([place]) 04/2017  . Thyroid disease   . Tremors of nervous system     PAST SURGICAL HISTORY:   Past Surgical History:  Procedure Laterality Date  . ABDOMINAL HYSTERECTOMY  1976  . APPENDECTOMY  1980  . BACK SURGERY    . CAROTID ENDARTERECTOMY    . CHOLECYSTECTOMY  1980  . COLONOSCOPY N/A 02/20/2017   Procedure: COLONOSCOPY;  Surgeon: Lin Landsman, MD;  Location: [facility] ENDOSCOPY;  Service: Gastroenterology;  Laterality: N/A;  . ESOPHAGOGASTRODUODENOSCOPY N/A 02/20/2017   Procedure:  ESOPHAGOGASTRODUODENOSCOPY (EGD);  Surgeon: Lin Landsman, MD;  Location: [facility] ENDOSCOPY;  Service: Gastroenterology;  Laterality: N/A;  . ESOPHAGOGASTRODUODENOSCOPY (EGD) WITH PROPOFOL N/A 07/08/2017   Procedure: ESOPHAGOGASTRODUODENOSCOPY (EGD) WITH PROPOFOL;  Surgeon: [name], MD;  Location: [facility] ENDOSCOPY;  Service: Endoscopy;  Laterality: N/A;  . [place]  . GIVENS CAPSULE STUDY N/A 07/09/2017   Procedure: GIVENS CAPSULE STUDY;  Surgeon: Jonathon Bellows, MD;  Location: [facility] ENDOSCOPY;  Service: Gastroenterology;  Laterality: N/A;  . KNEE SURGERY     left  . KYPHOPLASTY N/A 03/16/2018   Procedure: KYPHOPLASTY, L5;  Surgeon: Hessie Knows, MD;  Location: [facility] ORS;  Service: Orthopedics;  Laterality: N/A;  . ROTATOR CUFF REPAIR     right  . TONSILLECTOMY  1950    SOCIAL HISTORY:   Social History   Tobacco Use  . Smoking status: Current Every Day Smoker    Packs/day: 1.00    Years: 53.00    Pack years: 53.00    Types: Cigarettes  . Smokeless tobacco: Never Used  Substance Use Topics  . Alcohol use: No    FAMILY HISTORY:   Family History  Problem Relation Age of Onset  . Breast cancer Mother   . Heart disease Mother   . Stroke Mother   . Cancer Mother   . COPD Mother   . Diabetes Mother   . Heart disease Father   . Diabetes Father   .  Stroke Father   . Alcohol abuse Sister   . Drug abuse Sister   . Stroke Sister   . Cancer Sister   . Mental illness Sister   . Heart disease Brother   . Arthritis Brother   . Diabetes Brother   . Heart disease Maternal Grandfather   . Heart disease Paternal Grandfather     DRUG ALLERGIES:   Allergies  Allergen Reactions  . Ivp Dye [Iodinated Diagnostic Agents] Itching    Pt injected with IV contrast.  5 min after injection pt complained of itching behind ear and on her abd.  . Methotrexate Rash  . Morphine And Related Other (See Comments)    "stops my breathing" NEAR-RESPIRATORY ARREST  . Mushroom  Extract Complex Anaphylaxis    Made patient "deathly sick"  . Atenolol Other (See Comments)    "MD took me off of it bc it was doing something wrong" Made patient HYPOTENSIVE  . Percocet [Oxycodone-Acetaminophen] Nausea And Vomiting and Other (See Comments)    Stomach pains  . Percodan [Oxycodone-Aspirin] Nausea And Vomiting and Other (See Comments)    Stomach pains  . Tramadol Nausea Only  . Verapamil Other (See Comments)    " MD told me this was screwing me up" MAKES PATIENT HYPOTENSIVE  . Oxycodone Hcl     REVIEW OF SYSTEMS:   Review of Systems  Constitutional: Negative.  Negative for chills, fever and malaise/fatigue.  HENT: Negative.  Negative for ear discharge, ear pain, hearing loss, nosebleeds and sore throat.   Eyes: Negative.  Negative for blurred vision and pain.  Respiratory: Negative.  Negative for cough, hemoptysis, shortness of breath and wheezing.   Cardiovascular: Negative.  Negative for chest pain, palpitations and leg swelling.  Gastrointestinal: Negative.  Negative for abdominal pain, blood in stool, diarrhea, nausea and vomiting.  Genitourinary: Negative.  Negative for dysuria.  Musculoskeletal: Negative.  Negative for back pain.  Skin: Negative.   Neurological: Positive for focal weakness. Negative for dizziness, tremors, speech change, seizures and headaches.  Endo/Heme/Allergies: Negative.  Does not bruise/bleed easily.  Psychiatric/Behavioral: Negative.  Negative for depression, hallucinations and suicidal ideas.  All other systems reviewed and are negative.   MEDICATIONS AT HOME:   Prior to Admission medications   Medication Sig Start Date End Date Taking? Authorizing Provider  albuterol (PROVENTIL HFA;VENTOLIN HFA) 108 (90 Base) MCG/ACT inhaler Inhale 2 puffs into the lungs every 6 (six) hours as needed for wheezing or shortness of breath. 10/08/16   Karamalegos, Devonne Doughty, DO  aspirin (ASPIRIN CHILDRENS) 81 MG chewable tablet Chew 1 tablet (81 mg  total) by mouth daily. 03/17/18   Bettey Costa, MD  atorvastatin (LIPITOR) 80 MG tablet Take 1 tablet (80 mg total) by mouth daily at 6 PM. 09/24/16   Karamalegos, Devonne Doughty, DO  colestipol (COLESTID) 1 g tablet Take 2 g by mouth 2 (two) times daily. 10/13/17   [provider]  diclofenac sodium (VOLTAREN) 1 % GEL Apply 2 g topically 4 (four) times daily. 01/11/16   Daymon Larsen, MD  DULoxetine (CYMBALTA) 60 MG capsule Take 1 capsule (60 mg total) by mouth at bedtime. 09/24/16   Karamalegos, Devonne Doughty, DO  ferrous sulfate 325 (65 FE) MG EC tablet Take 1 tablet (325 mg total) by mouth 2 (two) times daily with a meal. Patient taking differently: Take 325 mg by mouth daily.  02/21/17   Hillary Bow, MD  fluticasone (FLONASE) 50 MCG/ACT nasal spray Place 1 spray into both nostrils daily.  10/08/16   Karamalegos, Devonne Doughty, DO  gabapentin (NEURONTIN) 800 MG tablet Take 1 tablet (800 mg total) by mouth 2 (two) times daily. 09/24/16   Karamalegos, Devonne Doughty, DO  glipiZIDE-metformin (METAGLIP) 5-500 MG tablet Take 1 tablet by mouth 2 (two) times daily before a meal. Patient taking differently: Take 1-2 tablets by mouth 2 (two) times daily before a meal. 2 tablets in the morning and 1 tablet in the evening 04/02/17   Karamalegos, Devonne Doughty, DO  levETIRAcetam (KEPPRA) 500 MG tablet Take 1 tablet (500 mg total) by mouth 2 (two) times daily. 09/19/15   Epifanio Lesches, MD  levothyroxine (SYNTHROID, LEVOTHROID) 112 MCG tablet Take 1 tablet (112 mcg total) by mouth daily before breakfast. 09/24/17   Geradine Girt, DO  lisinopril (PRINIVIL,ZESTRIL) 10 MG tablet Take 2 tablets (20 mg total) by mouth daily. 03/19/18   Bettey Costa, MD  nitroGLYCERIN (NITROSTAT) 0.4 MG SL tablet Place 1 tablet (0.4 mg total) under the tongue every 5 (five) minutes as needed for chest pain. 02/28/16   Bettey Costa, MD  omeprazole (PRILOSEC) 20 MG capsule Take 20 mg by mouth 2 (two) times daily. 09/02/17   [provider]  potassium chloride (K-DUR) 10 MEQ tablet Take 1 tablet (10 mEq total) by mouth daily. 06/22/18   Earleen Newport, MD  sucralfate (CARAFATE) 1 g tablet TAKE (1) TABLET BY MOUTH FOUR TIMES A DAY Patient taking differently: Take 1 g by mouth 4 (four) times daily.  03/26/17   Karamalegos, Devonne Doughty, DO  umeclidinium bromide (INCRUSE ELLIPTA) 62.5 MCG/INH AEPB Inhale 1 puff into the lungs daily. 11/13/15   Karamalegos, Devonne Doughty, DO  vitamin B-12 (CYANOCOBALAMIN) 500 MCG tablet Take 1 tablet by mouth daily.  06/29/17   [provider]      VITAL SIGNS:  Blood pressure 127/64, pulse 75, temperature 98.1 F (36.7 C), temperature source Oral, resp. rate (!) 25, weight 79.8 kg, SpO2 95 %.  PHYSICAL EXAMINATION:   Physical Exam Constitutional:      General: She is not in acute distress. HENT:     Head: Normocephalic.  Eyes:     General: No scleral icterus. Neck:     Musculoskeletal: Normal range of motion and neck supple.     Vascular: No JVD.     Trachea: No tracheal deviation.  Cardiovascular:     Rate and Rhythm: Normal rate and regular rhythm.     Heart sounds: Normal heart sounds. No murmur. No friction rub. No gallop.   Pulmonary:     Effort: Pulmonary effort is normal. No respiratory distress.     Breath sounds: Normal breath sounds. No wheezing or rales.  Chest:     Chest wall: No tenderness.  Abdominal:     General: Bowel sounds are normal. There is no distension.     Palpations: Abdomen is soft. There is no mass.     Tenderness: There is no abdominal tenderness. There is no guarding or rebound.  Musculoskeletal: Normal range of motion.  Skin:    General: Skin is warm.     Findings: No erythema or rash.  Neurological:     Mental Status: She is alert and oriented to person, place, and time. Mental status is at baseline.     Cranial Nerves: No cranial nerve deficit.     Gait: Gait normal.     Comments: Left-sided weakness  Psychiatric:         Mood and Affect: Mood normal.  Judgment: Judgment normal.       LABORATORY PANEL:   CBC Recent Labs  Lab 10/15/18 1118  WBC 8.6  HGB 13.3  HCT 40.7  PLT 299   ------------------------------------------------------------------------------------------------------------------  Chemistries  Recent Labs  Lab 10/15/18 1118  NA 142  K 2.8*  CL 105  CO2 24  GLUCOSE 107*  BUN 6*  CREATININE 0.68  CALCIUM 8.9  AST 24  ALT 20  ALKPHOS 120  BILITOT 0.7   ------------------------------------------------------------------------------------------------------------------  Cardiac Enzymes No results for input(s): TROPONINI in the last 168 hours. ------------------------------------------------------------------------------------------------------------------  RADIOLOGY:  Ct Head Code Stroke Wo Contrast  Result Date: 10/15/2018 CLINICAL DATA:  Code stroke. Facial paralysis. Possible stroke. Worsening left facial droop EXAM: CT HEAD WITHOUT CONTRAST TECHNIQUE: Contiguous axial images were obtained from the base of the skull through the vertex without intravenous contrast. COMPARISON:  CT and MRI head 06/13/2018 FINDINGS: Brain: Chronic infarct left posterior parietal lobe unchanged. Chronic microvascular ischemic change in the white matter unchanged. Mild atrophy. Negative for hydrocephalus. Chronic infarct in the right anterior corpus callosum unchanged. Chronic infarcts in the cerebellum bilaterally unchanged. Negative for acute infarct, hemorrhage, mass.  No midline shift. Vascular: Normal arterial flow voids. Skull: Negative Sinuses/Orbits: Negative Other: None ASPECTS ([place] Stroke Program Early CT Score) - Ganglionic level infarction (caudate, lentiform nuclei, internal capsule, insula, M1-M3 cortex): 7 - Supraganglionic infarction (M4-M6 cortex): 3 Total score (0-10 with 10 being normal): 10 IMPRESSION: 1. No acute abnormality 2. Atrophy and extensive chronic ischemic  changes, stable from the prior study. 3. ASPECTS is 10 4. These results were called by telephone at the time of interpretation on 10/15/2018 at 11:38 am to provider [facility] , who verbally acknowledged these results. Electronically Signed   By: Franchot Gallo M.D.   On: 10/15/2018 11:39    EKG:   Orders placed or performed during the hospital encounter of 10/15/18  . ED EKG  . ED EKG  . EKG 12-Lead  . EKG 12-Lead    IMPRESSION AND PLAN:   73 year old female with prior stroke with residual left-sided weakness, PAF and diastolic congestive heart failure who presented to the emergency room due to left-sided weakness.  1.Left-sided weakness concerning for acute stroke Patient evaluated by neurology and not a TPA candidate. Continue CVA work-up including MRI/MRA of the brain Echocardiogram with bubble Check fasting lipid and A1c Aspirin and Plavix Continue statin PT, OT speech consultation Allow permissive hypertension Continue telemetry 2. HypoThyroidism: Continue Synthroid  3.  Diabetes: Check A1c Sliding scale and ADA diet Metformin on hold Diabetes nurse consult  4.  Depression: Continue Cymbalta  5.  PAF: Patient in normal sinus rhythm Continue telemetry  6. Tobacco dependence: Patient is encouraged to quit smoking and willing to attempt to quit was assessed. Patient highly motivated.Counseling was provided for 4 minutes.  7.  COPD without signs of exacerbation  8.  Chronic diastolic heart failure: No signs of exacerbation Continue Lasix  All the records are reviewed and case discussed with ED provider. Management plans discussed with the patient and she is in agreement  CODE STATUS: DNR  TOTAL TIME TAKING CARE OF THIS PATIENT: 45 minutes.    Bettey Costa M.D on 10/15/2018 at 12:14 PM  Between 7am to 6pm - Pager - [phone]  After 6pm go to [email or web address] - password [facility]  Office  [phone]  CC: Primary care  physician; Tracie Harrier, MD

## 2018-10-15 NOTE — Consult Note (Signed)
PHARMACY CONSULT NOTE - FOLLOW UP  Pharmacy Consult for Electrolyte Monitoring and Replacement   Recent Labs: Potassium (mmol/L)  Date Value  10/15/2018 2.8 (L)   Magnesium (mg/dL)  Date Value  10/15/2018 1.9   Calcium (mg/dL)  Date Value  10/15/2018 8.9   Albumin (g/dL)  Date Value  10/15/2018 4.0   Phosphorus (mg/dL)  Date Value  07/06/2017 3.8   Sodium (mmol/L)  Date Value  10/15/2018 142     Assessment: Pharmacy has been consulted to monitor/replenish electrolytes in this 73 yo female with hypokalemia being seen for possible stroke.  K=2.8  10/2 20:00 repeat K 2.8, per RN patient only received 2 1/2 runs of KCl 56mEq of previous order for 4 runs  Goal of Therapy:  Electrolytes wnl's  Plan:  Will order KCl 96mEq IV times 4 more runs. Follow up on AM labs.  Paulina Fusi, PharmD, BCPS 10/15/2018 8:24 PM

## 2018-10-15 NOTE — ED Notes (Signed)
EDP cleared for CT

## 2018-10-15 NOTE — Progress Notes (Signed)
   10/15/18 1500  Clinical Encounter Type  Visited With Patient  Visit Type Initial  Referral From Nurse  Ch received an OR to create and update AD. Pt said that she would consult with her daughter and asked the ch to leave the document with her.

## 2018-10-16 ENCOUNTER — Inpatient Hospital Stay: Payer: Medicare Other

## 2018-10-16 ENCOUNTER — Inpatient Hospital Stay
Admit: 2018-10-16 | Discharge: 2018-10-16 | Disposition: A | Discharge status: Home / Self Care | Payer: Medicare Other | Attending: Internal Medicine | Admitting: Internal Medicine

## 2018-10-16 DIAGNOSIS — I480 Paroxysmal atrial fibrillation: Secondary | ICD-10-CM | POA: Diagnosis not present

## 2018-10-16 DIAGNOSIS — S299XXA Unspecified injury of thorax, initial encounter: Secondary | ICD-10-CM | POA: Diagnosis not present

## 2018-10-16 DIAGNOSIS — R531 Weakness: Secondary | ICD-10-CM

## 2018-10-16 DIAGNOSIS — Z716 Tobacco abuse counseling: Secondary | ICD-10-CM | POA: Diagnosis not present

## 2018-10-16 DIAGNOSIS — I6389 Other cerebral infarction: Secondary | ICD-10-CM | POA: Diagnosis not present

## 2018-10-16 DIAGNOSIS — I6782 Cerebral ischemia: Secondary | ICD-10-CM | POA: Diagnosis not present

## 2018-10-16 DIAGNOSIS — J449 Chronic obstructive pulmonary disease, unspecified: Secondary | ICD-10-CM | POA: Diagnosis not present

## 2018-10-16 LAB — GLUCOSE, CAPILLARY
Glucose-Capillary: 127 mg/dL — ABNORMAL HIGH (ref 70–99)
Glucose-Capillary: 128 mg/dL — ABNORMAL HIGH (ref 70–99)
Glucose-Capillary: 121 mg/dL — ABNORMAL HIGH (ref 70–99)
Glucose-Capillary: 128 mg/dL — ABNORMAL HIGH (ref 70–99)

## 2018-10-16 LAB — MAGNESIUM: Magnesium: 1.9 mg/dL (ref 1.7–2.4)

## 2018-10-16 LAB — LIPID PANEL
Cholesterol: 167 mg/dL (ref 0–200)
HDL: 39 mg/dL — ABNORMAL LOW (ref >40)
LDL Cholesterol: 84 mg/dL (ref 0–99)
Total CHOL/HDL Ratio: 4.3 RATIO
Triglycerides: 220 mg/dL — ABNORMAL HIGH (ref <150)
VLDL: 44 mg/dL — ABNORMAL HIGH (ref 0–40)

## 2018-10-16 LAB — HEMOGLOBIN A1C
Hgb A1c MFr Bld: 6.4 % — ABNORMAL HIGH (ref 4.8–5.6)
Mean Plasma Glucose: 136.98 mg/dL

## 2018-10-16 LAB — POTASSIUM: Potassium: 3.9 mmol/L (ref 3.5–5.1)

## 2018-10-16 MED ORDER — POTASSIUM CHLORIDE 10 MEQ/100ML IV SOLN
10.0000 meq | Freq: Once | INTRAVENOUS | Status: AC
  Administered 2018-10-16: 10 meq via INTRAVENOUS
  Filled 2018-10-16: qty 100

## 2018-10-16 NOTE — Care Management Obs Status (Signed)
[place] NOTIFICATION   Patient Details  Name: Samantha Aguirre MRN: [id number] Date of Birth: 1945/10/08   Medicare Observation Status Notification Given:  Yes    Tovia Kisner A Chanler Schreiter, RN 10/16/2018, 11:32 AM

## 2018-10-16 NOTE — Evaluation (Signed)
Occupational Therapy Evaluation Patient Details Name: Samantha Aguirre MRN: [id number] DOB: 1945/06/06 Today's Date: 10/16/2018    History of Present Illness Pt is a 73 year old female with c/o SOB and L sided weakness.  PMH includes stroke, seizures, COPD.   Clinical Impression   Pt seen for OT evaluation this date. Prior to hospital admission, pt was MOD I with fxl mobility and ADL transfers-t/f'ed from bed<>w/c<>commode and utilized w/c for fxl mobility primarily. In addition, she was Indep for dressing, bathing grooming and all aspects of self care although she endorses experiencing difficulty with dressing and bathing requiring increased time reqularly.  Pt lives I'ly in apartment with level entry.  Currently pt demonstrates impairments in standing balance (L knee buckling), fxl activity tolerance, dynamic sitting tolerance and balance for LB ADLs requiring MIN A for sit<>stand as well as MIN/MOD A for fxl mobility safety with FWW, and MIN/MOD A for safe completion of LB ADLs including dressing. Pt would benefit from skilled OT to address noted impairments and functional limitations (see below for any additional details) in order to maximize safety and independence while minimizing falls risk and caregiver burden.  Upon hospital discharge, recommend pt discharge to SNF for highest level of safety.    Follow Up Recommendations  SNF    Equipment Recommendations  Other (comment)(defer to next level of care.)    Recommendations for Other Services       Precautions / Restrictions Precautions Precautions: Fall      Mobility Bed Mobility Overal bed mobility: Needs Assistance Bed Mobility: Supine to Sit     Supine to sit: Min assist     General bed mobility comments: use of bed rail and HOB elevated  Transfers Overall transfer level: Needs assistance Equipment used: Rolling walker (2 wheeled) Transfers: Sit to/from Stand Sit to Stand: Min assist Stand pivot transfers: Min  guard       General transfer comment: fatigues easily, L knee buckling.    Balance Overall balance assessment: Needs assistance Sitting-balance support: Bilateral upper extremity supported;Feet supported Sitting balance-Leahy Scale: Good     Standing balance support: Bilateral upper extremity supported Standing balance-Leahy Scale: Fair Standing balance comment: Need for use of RW and L knee instability.                           ADL either performed or assessed with clinical judgement   ADL Overall ADL's : Needs assistance/impaired     Grooming: Wash/dry hands;Wash/dry face;Set up;Sitting   Upper Body Bathing: Set up;Sitting   Lower Body Bathing: Moderate assistance;Sit to/from stand;Supervison/ safety Lower Body Bathing Details (indicate cue type and reason): based on clinical observation Upper Body Dressing : Set up;Sitting   Lower Body Dressing: Supervision/safety;Minimal assistance;Moderate assistance;Sit to/from stand   Toilet Transfer: Minimal assistance;Moderate assistance;RW   Toileting- Clothing Manipulation and Hygiene: Minimal assistance;Supervision/safety   Tub/ Shower Transfer: Minimal assistance;Moderate assistance;Grab bars;Rolling walker;Shower Scientist, research (medical) Details (indicate cue type and reason): based on clinical observation Functional mobility during ADLs: Rolling walker;Minimal assistance;Moderate assistance(only takes 2-3 steps FWD/BKWD, poor balance with heavy UE use on FWW, MIN/MOD A throughout, L knee buckling)       Vision Baseline Vision/History: (pt c/o L eye with cloudy vision, states unknown dx, and states she has not glasses.) Patient Visual Report: No change from baseline       Perception     Praxis      Pertinent Vitals/Pain Pain Assessment: Faces Faces Pain  Scale: Hurts a little bit Pain Location: L LE, R shoulder Pain Descriptors / Indicators: Aching;Grimacing;Guarding Pain Intervention(s): Limited  activity within patient's tolerance;Monitored during session     Hand Dominance Right   Extremity/Trunk Assessment Upper Extremity Assessment Upper Extremity Assessment: Generalized weakness;RUE deficits/detail;LUE deficits/detail RUE Deficits / Details: WFL, shoulder/elbow flex/ext 4-/5, grip 4/5 LUE Deficits / Details: 1/2 range shoulder flexion and abduction, MMT 3-/5, elbow 3+/5 flex/ext, and grip 3-/5 LUE Coordination: decreased fine motor;decreased gross motor(3rd-5th digit numbness/weakness, pt states worse than baseline residual weakness from previous stroke. Unable to fully extend 3rd-5th digit.)   Lower Extremity Assessment Lower Extremity Assessment: Defer to PT evaluation;RLE deficits/detail;LLE deficits/detail RLE Deficits / Details: WFL for tasks performed/assessed LLE Deficits / Details: notable knee buckling, c/o numbess/weakness   Cervical / Trunk Assessment Cervical / Trunk Assessment: Kyphotic   Communication Communication Communication: No difficulties   Cognition Arousal/Alertness: Awake/alert Behavior During Therapy: WFL for tasks assessed/performed Overall Cognitive Status: Within Functional Limits for tasks assessed                                 General Comments: Pt able to follow commands appropriately, but does not answer some questions directly requiring more pointed follow up question to get clear answer. Not clear if r/t cognitive status or otherwise.   General Comments       Exercises Other Exercises Other Exercises: provides education re: general safety and fall prevention with pt verbalizing understanding. Other Exercises: education re: discharge options/recommendations. Verbalize understanding.   Shoulder Instructions      Home Living Family/patient expects to be discharged to:: Private residence Living Arrangements: Alone Available Help at Discharge: Neighbor;Available PRN/intermittently Type of Home: Apartment Home Access:  Level entry     Home Layout: One level     Bathroom Shower/Tub: Teacher, early years/pre: Standard Bathroom Accessibility: Yes How Accessible: Accessible via walker Home Equipment: [place] - 2 wheels;Shower seat;Cane - single point;Grab bars - tub/shower;Wheelchair - power;Bedside commode   Additional Comments: pt states she primarily transfers from bed to w/c and stays in w/c for fxl mobility      Prior Functioning/Environment Level of Independence: Independent with assistive device(s)    ADL's / Homemaking Assistance Needed: Pt states she has an Therapist, sports come every other day for med management, states she has difficulty with dressing and bathing, but just takes her time. States that neighbor will intermittently help with groceries as pt does not have transportation, but pt primarily has to wheel to nearby convenience store for food items.   Comments: Uses WC majority of day.  Transfers bed to WC and WC to toilet.  Does not like BSC.        OT Problem List: Decreased strength;Decreased range of motion;Decreased activity tolerance;Impaired balance (sitting and/or standing);Decreased coordination;Decreased knowledge of use of DME or AE      OT Treatment/Interventions: Self-care/ADL training;Therapeutic exercise;Energy conservation;DME and/or AE instruction;Therapeutic activities    OT Goals(Current goals can be found in the care plan section) Acute Rehab OT Goals Patient Stated Goal: to get home and get more assistance OT Goal Formulation: With patient Time For Goal Achievement: 10/30/18 Potential to Achieve Goals: Good  OT Frequency: Min 1X/week   Barriers to D/C: Other (comment)  food insecruity-wheels to convenience store in w/c, no consistent food delivery setup, sometimes neighbors assist.       Co-evaluation  AM-PAC OT "6 Clicks" Daily Activity     Outcome Measure Help from another person eating meals?: None Help from another person taking care  of personal grooming?: A Little Help from another person toileting, which includes using toliet, bedpan, or urinal?: A Little Help from another person bathing (including washing, rinsing, drying)?: A Little Help from another person to put on and taking off regular upper body clothing?: None Help from another person to put on and taking off regular lower body clothing?: A Little 6 Click Score: 20   End of Session Equipment Utilized During Treatment: Gait belt;Rolling walker  Activity Tolerance: Patient tolerated treatment well Patient left: in bed;with call bell/phone within reach;with bed alarm set  OT Visit Diagnosis: Unsteadiness on feet (R26.81);Muscle weakness (generalized) (M62.81)                Time: [id number] OT Time Calculation (min): 42 min Charges:  OT General Charges $OT Visit: 1 Visit OT Evaluation $OT Eval Moderate Complexity: 1 Mod OT Treatments $Self Care/Home Management : 8-22 mins $Therapeutic Activity: 8-22 mins  Gerrianne Scale, MS, OTR/L ascom [phone] or [phone] 10/16/18, 2:23 PM

## 2018-10-16 NOTE — Progress Notes (Signed)
Assigned to pt from 1500-1900.  Pt's discharge was cancelled.  Plan to be d/c to SNF.  A&O.  Denies pain. VSS.

## 2018-10-16 NOTE — TOC Transition Note (Addendum)
Transition of Care [facility]) - CM/SW Discharge Note   Patient Details  Name: Samantha Aguirre MRN: [id number] Date of Birth: 04/15/1945  Transition of Care [facility]) CM/SW Contact:  Latanya Maudlin, RN Phone Number: 10/16/2018, 11:34 AM   Clinical Narrative:  Patient to be discharged per MD order. Orders in place for home health services. Patient is either active or has recently been active with [facility], prefers them. Notified Michelle. Patient has wheelchair and other DME at home.    Update 15:01- PT now recommending SNF. Medicare has continued to waive 3 night inpatient hospitalization rule to we will work patient up for SNF.     Final next level of care: Home w Home Health Services Barriers to Discharge: No Barriers Identified   Patient Goals and CMS Choice   CMS Medicare.gov Compare Post Acute Care list provided to:: Patient Choice offered to / list presented to : Patient  Discharge Placement                       Discharge Plan and Services                          HH Arranged: RN, PT, Nurse's Aide [facility] Agency: [facility] Date [place]: 10/16/18 Time [place]: 1134 Representative spoke with at [place]: [place] ([place]) Interventions     Readmission Risk Interventions Readmission Risk Prevention Plan 10/16/2018 06/15/2018  Transportation Screening Complete Complete  PCP or Specialist Appt within 5-7 Days Complete -  PCP or Specialist Appt within 3-5 Days - Complete  Home Care Screening Complete -  Medication Review (RN CM) Complete -  HRI or [place] - Complete  Social Work Consult for [place] Planning/Counseling - Not Complete  SW consult not completed comments - na  Palliative Care Screening - Not Applicable  Medication Review Press photographer) - Complete  Some recent data might be hidden

## 2018-10-16 NOTE — Evaluation (Addendum)
Physical Therapy Evaluation Patient Details Name: Samantha Aguirre MRN: [id number] DOB: 1945/08/10 Today's Date: 10/16/2018   History of Present Illness  Pt is a 73 year old female with c/o SOB and L sided weakness.  PMH includes stroke, seizures, COPD.  Clinical Impression  Pt is a 73 year old female who lives in a one story home alone.  She is independent with transfers but remains in a WC for the majority of the day.  Pt alert and oriented and reporting fatigue and SOB as her primary concern.  She presented with fair strength of UE's/LE's and limited shoulder ROM.  Pt able to perform bed mobility and transfers with min A at most but did experience her L knee buckling on one occasion.  Pt was able to self correct but stated that this does happen fairly frequently.  Pt also interested in assistance with managing home care tasks such as laundry if this can be provided, as she states this is becoming more and more difficult to manage.  She reported pain in Le's throughout evaluation and stated that she felt fatigued and SOB after transfer.  Vitals remained WNL.  Pt will continue to benefit from skilled PT with focus on strength, safe functional mobility and pain management.    Follow Up Recommendations SNF     Equipment Recommendations  None recommended by PT    Recommendations for Other Services       Precautions / Restrictions Precautions Precautions: Fall      Mobility  Bed Mobility Overal bed mobility: Needs Assistance Bed Mobility: Supine to Sit     Supine to sit: Min assist     General bed mobility comments: Slow to sit up and with use of bed rail.  Pt was able to complete transfer after assistance to initiate movement.  Transfers Overall transfer level: Needs assistance Equipment used: Rolling walker (2 wheeled) Transfers: Stand Pivot Transfers   Stand pivot transfers: Min guard       General transfer comment: Good use of RW, L knee did buckle on one occasion which  pt stated happens frequently.  Slow short steps with Vc's for use of hands to support on BSC. Pt SOB and reports of fatigue following but vitals remained WNL.  Ambulation/Gait                Stairs            Wheelchair Mobility    Modified Rankin (Stroke Patients Only)       Balance Overall balance assessment: Needs assistance Sitting-balance support: Bilateral upper extremity supported;Feet supported Sitting balance-Leahy Scale: Good     Standing balance support: Bilateral upper extremity supported Standing balance-Leahy Scale: Fair Standing balance comment: Need for use of RW and L knee instability.                             Pertinent Vitals/Pain Pain Assessment: Faces Faces Pain Scale: Hurts a little bit Pain Location: L LE, R shoulder Pain Descriptors / Indicators: Aching;Grimacing;Guarding    Home Living Family/patient expects to be discharged to:: Private residence Living Arrangements: Alone Available Help at Discharge: Neighbor;Available PRN/intermittently Type of Home: Apartment Home Access: Level entry     Home Layout: One level Home Equipment: Walker - 2 wheels;Shower seat;Cane - single point;Grab bars - tub/shower;Wheelchair - power;Bedside commode      Prior Function Level of Independence: Independent with assistive device(s)  Comments: Uses WC majority of day.  Transfers bed to WC and WC to toilet.  Does not like BSC.     Hand Dominance   Dominant Hand: Right    Extremity/Trunk Assessment   Upper Extremity Assessment Upper Extremity Assessment: Generalized weakness(Shoulder AROM: BUE: 45 degrees, Strength: Grip, elbow flexion, extension:L3+/5 bilat.)    Lower Extremity Assessment Lower Extremity Assessment: Generalized weakness(Ankle DF/PF, knee flexion/ext, hip flexion: 4-/5 bilat.)    Cervical / Trunk Assessment Cervical / Trunk Assessment: Kyphotic  Communication   Communication: No difficulties   Cognition Arousal/Alertness: Awake/alert Behavior During Therapy: WFL for tasks assessed/performed Overall Cognitive Status: Within Functional Limits for tasks assessed                                 General Comments: Pt able to follow conversation and directions consistently.  Did appear to become confused during conversation on two occasions.      General Comments      Exercises Other Exercises Other Exercises: Assistance with toileting x6 min Other Exercises: monitoring of vitals x2 min   Assessment/Plan    PT Assessment Patient needs continued PT services  PT Problem List Decreased strength;Decreased mobility;Decreased activity tolerance;Decreased balance;Decreased knowledge of use of DME;Pain       PT Treatment Interventions DME instruction;Therapeutic activities;Gait training;Therapeutic exercise;Patient/family education;Stair training;Balance training;Functional mobility training    PT Goals (Current goals can be found in the Care Plan section)  Acute Rehab PT Goals Patient Stated Goal: to get some help with house cleaning, etc. PT Goal Formulation: With patient Time For Goal Achievement: 10/30/18 Potential to Achieve Goals: Good    Frequency Min 2X/week   Barriers to discharge        Co-evaluation               AM-PAC PT "6 Clicks" Mobility  Outcome Measure Help needed turning from your back to your side while in a flat bed without using bedrails?: A Little Help needed moving from lying on your back to sitting on the side of a flat bed without using bedrails?: A Little Help needed moving to and from a bed to a chair (including a wheelchair)?: A Little Help needed standing up from a chair using your arms (e.g., wheelchair or bedside chair)?: A Little Help needed to walk in hospital room?: A Little Help needed climbing 3-5 steps with a railing? : A Lot 6 Click Score: 17    End of Session Equipment Utilized During Treatment: Gait  belt Activity Tolerance: Patient limited by fatigue Patient left: in bed;with call bell/phone within reach;with bed alarm set   PT Visit Diagnosis: Unsteadiness on feet (R26.81);History of falling (Z91.81);Muscle weakness (generalized) (M62.81)    Time: [id number] PT Time Calculation (min) (ACUTE ONLY): 31 min   Charges:   PT Evaluation $PT Eval Low Complexity: 1 Low PT Treatments $Therapeutic Activity: 8-22 mins       Roxanne Gates, PT, DPT   Roxanne Gates 10/16/2018, 12:20 PM, ADDENDED [id number], 10/16/2018

## 2018-10-16 NOTE — NC FL2 (Signed)
[place] LEVEL OF CARE SCREENING TOOL     IDENTIFICATION  Patient Name: Samantha Aguirre Birthdate: 1945-07-03 Sex: female Admission Date (Current Location): 10/15/2018  [place] and [place] Number:  Engineering geologist and Address:  [facility], [address] [place], [place] [address]      Provider Number: [id number]  Attending Physician Name and Address:  Nicholes Mango, MD  Relative Name and Phone Number:  Jadine, Brammer Daughter   [phone]    Current Level of Care: Hospital Recommended Level of Care: [place] Prior Approval Number:    Date Approved/Denied:   PASRR Number: [id number]  Discharge Plan: SNF    Current Diagnoses: Patient Active Problem List   Diagnosis Date Noted  . Stroke ([place]) 06/13/2018  . Hypotension 03/18/2018  . Malnutrition of moderate degree 03/16/2018  . Closed fracture of lumbar vertebral body ([place]) 03/15/2018  . Speaking difficulty 03/13/2018  . TIA (transient ischemic attack) 09/22/2017  . PAF (paroxysmal atrial fibrillation) ([place])   . Acute CVA (cerebrovascular accident) ([place]) 07/30/2017  . GI bleed 07/08/2017  . Anemia 07/08/2017  . Angiodysplasia of stomach and duodenum with hemorrhage   . CVA (cerebral vascular accident) ([place]) 07/06/2017  . Stroke (cerebrum) ([place])   . Goals of care, counseling/discussion   . Palliative care encounter   . COPD exacerbation ([place]) 04/03/2017  . Flu 03/27/2017  . Iron deficiency anemia due to chronic blood loss   . Migraine 02/13/2017  . Cataracts, bilateral 01/15/2017  . Chronic venous insufficiency 10/06/2016  . Recurrent major depressive disorder, in partial remission ([place]) 10/06/2016  . Left-sided weakness 10/03/2016  . Chest pain 02/27/2016  . History of stroke 02/19/2016  . Thumb pain, left 01/22/2016  . Traumatic closed nondisplaced fracture of base of metacarpal bone of left thumb 01/22/2016  . Colon cancer screening 11/27/2015   . Depression 11/14/2015  . Coronary artery disease 11/13/2015  . Hypertension 11/13/2015  . History of skin cancer 11/13/2015  . Osteoporosis 11/13/2015  . Chronic low back pain 11/13/2015  . Left medial knee pain 11/13/2015  . Osteoarthritis of multiple joints 11/13/2015  . Other specified hypothyroidism 08/16/2015  . Controlled type 2 diabetes mellitus with diabetic neuropathy ([place]) 08/16/2015  . COPD (chronic obstructive pulmonary disease) ([place]) 08/16/2015  . Tobacco abuse 08/16/2015  . HLD (hyperlipidemia) 08/16/2015  . History of CHF (congestive heart failure) 08/16/2015  . Seizure disorder ([place])     Orientation RESPIRATION BLADDER Height & Weight     Self, Time, Situation, Place  Normal Continent Weight: 79.8 kg Height:  5\' 7"  (170.2 cm)  BEHAVIORAL SYMPTOMS/MOOD NEUROLOGICAL BOWEL NUTRITION STATUS      Continent    AMBULATORY STATUS COMMUNICATION OF NEEDS Skin   Extensive Assist Verbally Normal                       Personal Care Assistance Level of Assistance  Bathing, Dressing, Total care, Feeding Bathing Assistance: Maximum assistance Feeding assistance: Limited assistance Dressing Assistance: Maximum assistance Total Care Assistance: Maximum assistance   Functional Limitations Info  Sight, Hearing, Speech Sight Info: Adequate Hearing Info: Adequate Speech Info: Adequate    SPECIAL CARE FACTORS FREQUENCY  PT (By licensed PT), OT (By licensed OT)     PT Frequency: 5x per week OT Frequency: 5x per week            Contractures Contractures Info: Not present    Additional Factors Info  Code Status, Allergies  Code Status Info: DNR Allergies Info: IVP dye, methotrexate, morphine, mushroom extract, atenolol, percocet, percodan, tramadol, verapamil, oxycodone,           Current Medications (10/16/2018):  This is the current hospital active medication list Current Facility-Administered Medications  Medication Dose Route Frequency Provider Last  Rate Last Dose  . acetaminophen (TYLENOL) tablet 650 mg  650 mg Oral Q4H PRN Bettey Costa, MD       Or  . acetaminophen (TYLENOL) solution 650 mg  650 mg Per Tube Q4H PRN Bettey Costa, MD       Or  . acetaminophen (TYLENOL) suppository 650 mg  650 mg Rectal Q4H PRN Mody, Sital, MD      . aspirin EC tablet 325 mg  325 mg Oral Daily Bettey Costa, MD   325 mg at 10/16/18 0921  . atorvastatin (LIPITOR) tablet 80 mg  80 mg Oral q1800 Bettey Costa, MD   80 mg at 10/15/18 1800  . clopidogrel (PLAVIX) tablet 75 mg  75 mg Oral Daily Bettey Costa, MD   75 mg at 10/16/18 0920  . DULoxetine (CYMBALTA) DR capsule 60 mg  60 mg Oral QHS Bettey Costa, MD   60 mg at 10/15/18 2253  . enoxaparin (LOVENOX) injection 40 mg  40 mg Subcutaneous Q24H Bettey Costa, MD   40 mg at 10/15/18 2253  . furosemide (LASIX) tablet 20 mg  20 mg Oral BID Bettey Costa, MD   20 mg at 10/16/18 0923  . gabapentin (NEURONTIN) capsule 800 mg  800 mg Oral BID Bettey Costa, MD   800 mg at 10/16/18 [id number]  . insulin aspart (novoLOG) injection 0-5 Units  0-5 Units Subcutaneous QHS Mody, Sital, MD      . insulin aspart (novoLOG) injection 0-9 Units  0-9 Units Subcutaneous TID WC Mody, Sital, MD   1 Units at 10/16/18 1231  . labetalol (NORMODYNE) injection 10 mg  10 mg Intravenous Q6H PRN Bettey Costa, MD      . levETIRAcetam (KEPPRA) tablet 500 mg  500 mg Oral BID Bettey Costa, MD   500 mg at 10/16/18 0921  . levothyroxine (SYNTHROID) tablet 112 mcg  112 mcg Oral QAC breakfast Bettey Costa, MD   112 mcg at 10/16/18 0537  . nitroGLYCERIN (NITROSTAT) SL tablet 0.4 mg  0.4 mg Sublingual Q5 min PRN Bettey Costa, MD      . topiramate (TOPAMAX) tablet 25 mg  25 mg Oral BID Bettey Costa, MD   25 mg at 10/16/18 0920  . vitamin B-12 (CYANOCOBALAMIN) tablet 500 mcg  500 mcg Oral Daily Bettey Costa, MD   500 mcg at 10/16/18 [id number]     Discharge Medications: Please see discharge summary for a list of discharge medications.  Relevant Imaging Results:  Relevant Lab  Results:   Additional Information ss #  [id number]  Latanya Maudlin, RN

## 2018-10-16 NOTE — Care Management CC44 (Signed)
Condition Code 44 Documentation Completed  Patient Details  Name: Sanvi Oguinn MRN: [id number] Date of Birth: 18-Aug-1945   Condition Code 44 given:  Yes Patient signature on Condition Code 44 notice:  Yes Documentation of 2 MD's agreement:  Yes Code 44 added to claim:  Yes    Latanya Maudlin, RN 10/16/2018, 11:33 AM

## 2018-10-16 NOTE — Discharge Summary (Addendum)
[facility] NAME: Samantha Aguirre    MR#:  [id number]  DATE OF BIRTH:  [birth year]  DATE OF ADMISSION:  10/15/2018 ADMITTING PHYSICIAN: Bettey Costa, MD  DATE OF DISCHARGE:  10/16/18 PRIMARY CARE PHYSICIAN: Tracie Harrier, MD    ADMISSION DIAGNOSIS:  Weakness [R53.1]  DISCHARGE DIAGNOSIS:  Left-sided weakness  SECONDARY DIAGNOSIS:   Past Medical History:  Diagnosis Date  . Acid reflux   . Arthritis    Bilateral knees, hands, feet  . Asthma   . Atrial fibrillation ([place])   . Cancer ([place])   . CHF (congestive heart failure) (HCC)    Diastolic CHF  . COPD (chronic obstructive pulmonary disease) ([place])   . Coronary artery disease   . Diabetes mellitus without complication ([place])   . Frequent headaches   . GI bleed   . HLD (hyperlipidemia)   . Hypertension   . Seizures ([place])   . Skin cancer 2015   Suspected basal cell carcinoma on nose, surgically removed  . Stroke ([place]) 04/2017  . Thyroid disease   . Tremors of nervous system     HOSPITAL COURSE:   HPI  Samantha Aguirre  is a 73 y.o. female with a known history of previous CVA with residual left-sided weakness, chronic diastolic heart failure and COPD who presents to the emergency room due to left-sided weakness. Patient walks with a walker at baseline due to her left-sided weakness.  Patient reports that this morning she had increased left-sided weakness especially left lower extremity weakness.  Patient has been evaluated by neurology.  Patient is close to baseline and risk for TPA according to her neurologist.  1.Left-sided weakness-stroke ruled out Patient evaluated by neurology and not a TPA candidate. Negative MRA/MRI of the brain, stroke ruled out  Patient has chronic left-sided weakness from previous stroke, okay to discharge patient from neurology standpoint with aspirin 81 mg  Outpatient follow-up with neurology as recommended or in a month fasting  lipid LDL at 84 Aspirin and Plavix her home medications will be continued Continue statin PT, OT evaluation-Home health PT, 24 HR supervision   speech consultation-no needs identified Patient is a wheelchair, walker bound at home at her baseline  2. HypoThyroidism: Continue Synthroid  3.  Diabetes:A1c- 6.4  Sliding scale provided during the hospital course  ADA diet Metformin  Diabetes nurse consult has seen the patient during the hospital stay  4.  Depression: Continue Cymbalta  5.  PAF: Patient in normal sinus rhythm Continue telemetry  6. Tobacco dependence: Patient is encouraged to quit smoking and willing to attempt to quit was assessed. Patient highly motivated.Counseling was provided for 4 minutes by the admitting physician  7.  COPD without signs of exacerbation  8.  Chronic diastolic heart failure: No signs of exacerbation Continue Lasix  DISCHARGE CONDITIONS:  fair  CONSULTS OBTAINED:  Treatment Team:  Leotis Pain, MD   PROCEDURES  None   DRUG ALLERGIES:   Allergies  Allergen Reactions  . Ivp Dye [Iodinated Diagnostic Agents] Itching    Pt injected with IV contrast.  5 min after injection pt complained of itching behind ear and on her abd.  . Methotrexate Rash  . Morphine And Related Other (See Comments)    "stops my breathing" NEAR-RESPIRATORY ARREST  . Mushroom Extract Complex Anaphylaxis    Made patient "deathly sick"  . Atenolol Other (See Comments)    "MD took me off of it bc it was doing something  wrong" Made patient HYPOTENSIVE  . Percocet [Oxycodone-Acetaminophen] Nausea And Vomiting and Other (See Comments)    Stomach pains  . Percodan [Oxycodone-Aspirin] Nausea And Vomiting and Other (See Comments)    Stomach pains  . Tramadol Nausea Only  . Verapamil Other (See Comments)    " MD told me this was screwing me up" MAKES PATIENT HYPOTENSIVE  . Oxycodone Hcl     DISCHARGE MEDICATIONS:   Allergies as of 10/16/2018       Reactions   Ivp Dye [iodinated Diagnostic Agents] Itching   Pt injected with IV contrast.  5 min after injection pt complained of itching behind ear and on her abd.   Methotrexate Rash   Morphine And Related Other (See Comments)   "stops my breathing" NEAR-RESPIRATORY ARREST   Mushroom Extract Complex Anaphylaxis   Made patient "deathly sick"   Atenolol Other (See Comments)   "MD took me off of it bc it was doing something wrong" Made patient HYPOTENSIVE   Percocet [oxycodone-acetaminophen] Nausea And Vomiting, Other (See Comments)   Stomach pains   Percodan [oxycodone-aspirin] Nausea And Vomiting, Other (See Comments)   Stomach pains   Tramadol Nausea Only   Verapamil Other (See Comments)   " MD told me this was screwing me up" MAKES PATIENT HYPOTENSIVE   Oxycodone Hcl       Medication List    STOP taking these medications   diclofenac sodium 1 % Gel Commonly known as: VOLTAREN   fluticasone 50 MCG/ACT nasal spray Commonly known as: FLONASE   lisinopril 10 MG tablet Commonly known as: ZESTRIL   sucralfate 1 g tablet Commonly known as: CARAFATE     TAKE these medications   albuterol 108 (90 Base) MCG/ACT inhaler Commonly known as: VENTOLIN HFA Inhale 2 puffs into the lungs every 6 (six) hours as needed for wheezing or shortness of breath.   aspirin 81 MG chewable tablet Commonly known as: Aspirin Childrens Chew 1 tablet (81 mg total) by mouth daily.   atorvastatin 80 MG tablet Commonly known as: LIPITOR Take 1 tablet (80 mg total) by mouth daily at 6 PM.   azelastine 0.1 % nasal spray Commonly known as: ASTELIN Place 2 sprays into both nostrils 2 (two) times daily.   clopidogrel 75 MG tablet Commonly known as: PLAVIX Take 75 mg by mouth daily.   colestipol 1 g tablet Commonly known as: COLESTID Take 2 g by mouth 2 (two) times daily.   DULoxetine 60 MG capsule Commonly known as: CYMBALTA Take 1 capsule (60 mg total) by mouth at bedtime.   ferrous  sulfate 325 (65 FE) MG EC tablet Take 1 tablet (325 mg total) by mouth 2 (two) times daily with a meal. What changed: when to take this   furosemide 20 MG tablet Commonly known as: LASIX Take 20 mg by mouth 2 (two) times daily.   gabapentin 800 MG tablet Commonly known as: NEURONTIN Take 1 tablet (800 mg total) by mouth 2 (two) times daily.   glipiZIDE-metformin 5-500 MG tablet Commonly known as: METAGLIP Take 1 tablet by mouth 2 (two) times daily before a meal. What changed:   how much to take  when to take this  additional instructions   levETIRAcetam 500 MG tablet Commonly known as: KEPPRA Take 1 tablet (500 mg total) by mouth 2 (two) times daily.   levothyroxine 112 MCG tablet Commonly known as: SYNTHROID Take 1 tablet (112 mcg total) by mouth daily before breakfast.   nitroGLYCERIN 0.4 MG SL  tablet Commonly known as: NITROSTAT Place 1 tablet (0.4 mg total) under the tongue every 5 (five) minutes as needed for chest pain.   omeprazole 20 MG capsule Commonly known as: PRILOSEC Take 20 mg by mouth 2 (two) times daily.   potassium chloride 10 MEQ tablet Commonly known as: KLOR-CON Take 1 tablet (10 mEq total) by mouth daily.   topiramate 25 MG tablet Commonly known as: TOPAMAX Take 25 mg by mouth 2 (two) times daily.   umeclidinium bromide 62.5 MCG/INH Aepb Commonly known as: Incruse Ellipta Inhale 1 puff into the lungs daily.   vitamin B-12 500 MCG tablet Commonly known as: CYANOCOBALAMIN Take 500 mcg by mouth daily.        DISCHARGE INSTRUCTIONS:   Follow-up with primary care physician in 3 to 4 days Follow-up with outpatient primary neurology as recommended or in a month HH PT ,24 HR supervision   DIET:  Cardiac diet and Diabetic diet  DISCHARGE CONDITION:  Fair  ACTIVITY:  Activity as tolerated per PT  OXYGEN:  Home Oxygen: No.   Oxygen Delivery: room air  DISCHARGE LOCATION:  home   If you experience worsening of your admission  symptoms, develop shortness of breath, life threatening emergency, suicidal or homicidal thoughts you must seek medical attention immediately by calling 911 or calling your MD immediately  if symptoms less severe.  You Must read complete instructions/literature along with all the possible adverse reactions/side effects for all the Medicines you take and that have been prescribed to you. Take any new Medicines after you have completely understood and accpet all the possible adverse reactions/side effects.   Please note  You were cared for by a hospitalist during your hospital stay. If you have any questions about your discharge medications or the care you received while you were in the hospital after you are discharged, you can call the unit and asked to speak with the hospitalist on call if the hospitalist that took care of you is not available. Once you are discharged, your primary care physician will handle any further medical issues. Please note that NO REFILLS for any discharge medications will be authorized once you are discharged, as it is imperative that you return to your primary care physician (or establish a relationship with a primary care physician if you do not have one) for your aftercare needs so that they can reassess your need for medications and monitor your lab values.     Today  Chief Complaint  Patient presents with  . Weakness   Patient is feeling fine.  Has chronic left-sided weakness from previous stroke otherwise no new complaints.  Okay to discharge patient from neurology standpoint and patient feels comfortable to go home Requesting transportation  ROS:  CONSTITUTIONAL: Denies fevers, chills. Denies any fatigue, weakness.  EYES: Denies blurry vision, double vision, eye pain. EARS, NOSE, THROAT: Denies tinnitus, ear pain, hearing loss. RESPIRATORY: Denies cough, wheeze, shortness of breath.  CARDIOVASCULAR: Denies chest pain, palpitations, edema.  GASTROINTESTINAL:  Denies nausea, vomiting, diarrhea, abdominal pain. Denies bright red blood per rectum. GENITOURINARY: Denies dysuria, hematuria. ENDOCRINE: Denies nocturia or thyroid problems. HEMATOLOGIC AND LYMPHATIC: Denies easy bruising or bleeding. SKIN: Denies rash or lesion. MUSCULOSKELETAL: Denies pain in neck, back, shoulder, knees, hips or arthritic symptoms.  NEUROLOGIC: Chronic left-sided weakness PSYCHIATRIC: Denies anxiety or depressive symptoms.   VITAL SIGNS:  Blood pressure 124/69, pulse 61, temperature 97.6 F (36.4 C), temperature source Oral, resp. rate 18, height 5\' 7"  (1.702 m), weight 79.8  kg, SpO2 97 %.  I/O:    Intake/Output Summary (Last 24 hours) at 10/16/2018 1043 Last data filed at 10/16/2018 0433 Gross per 24 hour  Intake 304.75 ml  Output -  Net 304.75 ml    PHYSICAL EXAMINATION:  GENERAL:  73 y.o.-year-old patient lying in the bed with no acute distress.  EYES: Pupils equal, round, reactive to light and accommodation. No scleral icterus. Extraocular muscles intact.  HEENT: Head atraumatic, normocephalic. Oropharynx and nasopharynx clear.  NECK:  Supple, no jugular venous distention. No thyroid enlargement, no tenderness.  LUNGS: Normal breath sounds bilaterally, no wheezing, rales,rhonchi or crepitation. No use of accessory muscles of respiration.  CARDIOVASCULAR: S1, S2 normal. No murmurs, rubs, or gallops.  ABDOMEN: Soft, non-tender, non-distended. Bowel sounds present.  EXTREMITIES: No pedal edema, cyanosis, or clubbing.  NEUROLOGIC: Cranial nerves II through XII are intact. Muscle strength 5/5 in all right-sided extremities, 3-4 out of 5 on left extremities. Sensation intact. Gait not checked.  PSYCHIATRIC: The patient is alert and oriented x 3.  SKIN: No obvious rash, lesion, or ulcer.   DATA REVIEW:   CBC Recent Labs  Lab 10/15/18 1118  WBC 8.6  HGB 13.3  HCT 40.7  PLT 299    Chemistries  Recent Labs  Lab 10/15/18 1118  10/16/18 0601  NA 142   --   --   K 2.8*   < > 3.9  CL 105  --   --   CO2 24  --   --   GLUCOSE 107*  --   --   BUN 6*  --   --   CREATININE 0.68  --   --   CALCIUM 8.9  --   --   MG 1.9  --  1.9  AST 24  --   --   ALT 20  --   --   ALKPHOS 120  --   --   BILITOT 0.7  --   --    < > = values in this interval not displayed.    Cardiac Enzymes No results for input(s): TROPONINI in the last 168 hours.  Microbiology Results  Results for orders placed or performed during the hospital encounter of 10/15/18  SARS CORONAVIRUS 2 (TAT 6-24 HRS) Nasopharyngeal Nasopharyngeal Swab     Status: None   Collection Time: 10/15/18 11:18 AM   Specimen: Nasopharyngeal Swab  Result Value Ref Range Status   SARS Coronavirus 2 NEGATIVE NEGATIVE Final    Comment: (NOTE) SARS-CoV-2 target nucleic acids are NOT DETECTED. The SARS-CoV-2 RNA is generally detectable in upper and lower respiratory specimens during the acute phase of infection. Negative results do not preclude SARS-CoV-2 infection, do not rule out co-infections with other pathogens, and should not be used as the sole basis for treatment or other patient management decisions. Negative results must be combined with clinical observations, patient history, and epidemiological information. The expected result is Negative. Fact Sheet for Patients: SugarRoll.be Fact Sheet for Healthcare Providers: https://www.woods-mathews.com/ This test is not yet approved or cleared by the [place] FDA and  has been authorized for detection and/or diagnosis of SARS-CoV-2 by FDA under an Emergency Use Authorization (EUA). This EUA will remain  in effect (meaning this test can be used) for the duration of the COVID-19 declaration under Section 56 4(b)(1) of the Act, 21 U.S.C. section 360bbb-3(b)(1), unless the authorization is terminated or revoked sooner. Performed at [facility], [place] [address] [place], [place] [address]  RADIOLOGY:  Mr Angio Head Wo Contrast  Result Date: 10/16/2018 CLINICAL DATA:  Left-sided weakness EXAM: MR HEAD WITHOUT CONTRAST MR CIRCLE OF WILLIS WITHOUT CONTRAST MRA OF THE NECK WITHOUT AND WITH CONTRAST TECHNIQUE: Multiplanar, multiecho pulse sequences of the brain, circle of willis and surrounding structures were obtained without intravenous contrast. Angiographic images of the neck were obtained using MRA technique without and with intravenous contrast. CONTRAST:  52mL GADAVIST GADOBUTROL 1 MMOL/ML IV SOLN COMPARISON:  CTA neck 09/22/2017 FINDINGS: Examination is generally degraded by motion. MRI HEAD FINDINGS BRAIN: There is an old left cerebellar infarct. There is posterior left MCA distribution encephalomalacia. There is no acute infarct, acute hemorrhage or mass. Diffuse confluent hyperintense T2-weighted signal within the periventricular, deep and juxtacortical white matter, most commonly due to chronic ischemic microangiopathy. Old posterior left MCA and left PCA territory infarcts with associated encephalomalacia. Generalized mild volume loss otherwise. Blood-sensitive sequences show no chronic microhemorrhage or superficial siderosis. Limited assessment of previously demonstrated left para clinoid mass without IV contrast. SKULL AND UPPER CERVICAL SPINE: The visualized skull base, calvarium, upper cervical spine and extracranial soft tissues are normal. SINUSES/ORBITS: No fluid levels or advanced mucosal thickening. No mastoid or middle ear effusion. The orbits are normal. MRA HEAD FINDINGS POSTERIOR CIRCULATION: --Basilar artery: Normal. --Posterior cerebral arteries: Fetal origins of both --Superior cerebellar arteries: Normal. --Inferior cerebellar arteries: Normal anterior and posterior inferior cerebellar arteries. ANTERIOR CIRCULATION: --Intracranial internal carotid arteries: Normal. --Anterior cerebral arteries: Normal. Both A1 segments are present. Patent anterior communicating  artery. --Middle cerebral arteries: Normal. --Posterior communicating arteries: Present bilaterally MRA NECK FINDINGS Aortic arch: Normal 3 vessel aortic branching pattern. The visualized subclavian arteries are normal. Right carotid system: Normal course and caliber without stenosis or evidence of dissection. Left carotid system: Normal course and caliber without stenosis or evidence of dissection. Vertebral arteries: Right. Vertebral artery origins are poorly visualized. Vertebral arteries are normal in course and caliber to the vertebrobasilar confluence without stenosis or evidence of dissection. IMPRESSION: 1. No acute intracranial process. 2. Old left MCA and left PCA territory infarcts and severe chronic small vessel disease. 3. No emergent large vessel occlusion or high-grade stenosis. 4. Limited examination due to motion artifact. No hemodynamically significant stenosis of the carotid or vertebral arteries in the neck. 5. Limited assessment of previously demonstrated left paraclinoid mass without IV contrast material. Electronically Signed   By: Ulyses Jarred M.D.   On: 10/16/2018 01:36   Mr Angio Neck W Wo Contrast  Result Date: 10/16/2018 CLINICAL DATA:  Left-sided weakness EXAM: MR HEAD WITHOUT CONTRAST MR CIRCLE OF WILLIS WITHOUT CONTRAST MRA OF THE NECK WITHOUT AND WITH CONTRAST TECHNIQUE: Multiplanar, multiecho pulse sequences of the brain, circle of willis and surrounding structures were obtained without intravenous contrast. Angiographic images of the neck were obtained using MRA technique without and with intravenous contrast. CONTRAST:  67mL GADAVIST GADOBUTROL 1 MMOL/ML IV SOLN COMPARISON:  CTA neck 09/22/2017 FINDINGS: Examination is generally degraded by motion. MRI HEAD FINDINGS BRAIN: There is an old left cerebellar infarct. There is posterior left MCA distribution encephalomalacia. There is no acute infarct, acute hemorrhage or mass. Diffuse confluent hyperintense T2-weighted signal  within the periventricular, deep and juxtacortical white matter, most commonly due to chronic ischemic microangiopathy. Old posterior left MCA and left PCA territory infarcts with associated encephalomalacia. Generalized mild volume loss otherwise. Blood-sensitive sequences show no chronic microhemorrhage or superficial siderosis. Limited assessment of previously demonstrated left para clinoid mass without IV contrast. SKULL AND UPPER CERVICAL SPINE:  The visualized skull base, calvarium, upper cervical spine and extracranial soft tissues are normal. SINUSES/ORBITS: No fluid levels or advanced mucosal thickening. No mastoid or middle ear effusion. The orbits are normal. MRA HEAD FINDINGS POSTERIOR CIRCULATION: --Basilar artery: Normal. --Posterior cerebral arteries: Fetal origins of both --Superior cerebellar arteries: Normal. --Inferior cerebellar arteries: Normal anterior and posterior inferior cerebellar arteries. ANTERIOR CIRCULATION: --Intracranial internal carotid arteries: Normal. --Anterior cerebral arteries: Normal. Both A1 segments are present. Patent anterior communicating artery. --Middle cerebral arteries: Normal. --Posterior communicating arteries: Present bilaterally MRA NECK FINDINGS Aortic arch: Normal 3 vessel aortic branching pattern. The visualized subclavian arteries are normal. Right carotid system: Normal course and caliber without stenosis or evidence of dissection. Left carotid system: Normal course and caliber without stenosis or evidence of dissection. Vertebral arteries: Right. Vertebral artery origins are poorly visualized. Vertebral arteries are normal in course and caliber to the vertebrobasilar confluence without stenosis or evidence of dissection. IMPRESSION: 1. No acute intracranial process. 2. Old left MCA and left PCA territory infarcts and severe chronic small vessel disease. 3. No emergent large vessel occlusion or high-grade stenosis. 4. Limited examination due to motion  artifact. No hemodynamically significant stenosis of the carotid or vertebral arteries in the neck. 5. Limited assessment of previously demonstrated left paraclinoid mass without IV contrast material. Electronically Signed   By: Ulyses Jarred M.D.   On: 10/16/2018 01:36   [name]  Result Date: 10/16/2018 CLINICAL DATA:  Left-sided weakness EXAM: MR HEAD WITHOUT CONTRAST MR CIRCLE OF WILLIS WITHOUT CONTRAST MRA OF THE NECK WITHOUT AND WITH CONTRAST TECHNIQUE: Multiplanar, multiecho pulse sequences of the brain, circle of willis and surrounding structures were obtained without intravenous contrast. Angiographic images of the neck were obtained using MRA technique without and with intravenous contrast. CONTRAST:  71mL GADAVIST GADOBUTROL 1 MMOL/ML IV SOLN COMPARISON:  CTA neck 09/22/2017 FINDINGS: Examination is generally degraded by motion. MRI HEAD FINDINGS BRAIN: There is an old left cerebellar infarct. There is posterior left MCA distribution encephalomalacia. There is no acute infarct, acute hemorrhage or mass. Diffuse confluent hyperintense T2-weighted signal within the periventricular, deep and juxtacortical white matter, most commonly due to chronic ischemic microangiopathy. Old posterior left MCA and left PCA territory infarcts with associated encephalomalacia. Generalized mild volume loss otherwise. Blood-sensitive sequences show no chronic microhemorrhage or superficial siderosis. Limited assessment of previously demonstrated left para clinoid mass without IV contrast. SKULL AND UPPER CERVICAL SPINE: The visualized skull base, calvarium, upper cervical spine and extracranial soft tissues are normal. SINUSES/ORBITS: No fluid levels or advanced mucosal thickening. No mastoid or middle ear effusion. The orbits are normal. MRA HEAD FINDINGS POSTERIOR CIRCULATION: --Basilar artery: Normal. --Posterior cerebral arteries: Fetal origins of both --Superior cerebellar arteries: Normal. --Inferior  cerebellar arteries: Normal anterior and posterior inferior cerebellar arteries. ANTERIOR CIRCULATION: --Intracranial internal carotid arteries: Normal. --Anterior cerebral arteries: Normal. Both A1 segments are present. Patent anterior communicating artery. --Middle cerebral arteries: Normal. --Posterior communicating arteries: Present bilaterally MRA NECK FINDINGS Aortic arch: Normal 3 vessel aortic branching pattern. The visualized subclavian arteries are normal. Right carotid system: Normal course and caliber without stenosis or evidence of dissection. Left carotid system: Normal course and caliber without stenosis or evidence of dissection. Vertebral arteries: Right. Vertebral artery origins are poorly visualized. Vertebral arteries are normal in course and caliber to the vertebrobasilar confluence without stenosis or evidence of dissection. IMPRESSION: 1. No acute intracranial process. 2. Old left MCA and left PCA territory infarcts and severe chronic small vessel disease. 3.  No emergent large vessel occlusion or high-grade stenosis. 4. Limited examination due to motion artifact. No hemodynamically significant stenosis of the carotid or vertebral arteries in the neck. 5. Limited assessment of previously demonstrated left paraclinoid mass without IV contrast material. Electronically Signed   By: Ulyses Jarred M.D.   On: 10/16/2018 01:36   Dg Chest Port 1 View  Result Date: 10/16/2018 CLINICAL DATA:  Smoker.  Recent fall.  COPD. EXAM: PORTABLE CHEST 1 VIEW COMPARISON:  March 18, 2018 FINDINGS: The heart size is unchanged. Aortic calcifications are noted. There is no pneumothorax. No large pleural effusion. No acute osseous abnormality. IMPRESSION: No active disease. Electronically Signed   By: Constance Holster M.D.   On: 10/16/2018 10:34   Dg Hip Unilat W Or Wo Pelvis 2-3 Views Left  Result Date: 10/15/2018 CLINICAL DATA:  Left hip pain after fall 5 days ago. EXAM: DG HIP (WITH OR WITHOUT PELVIS) 2-3V  LEFT COMPARISON:  Radiographs of March 17, 2018. FINDINGS: There is no evidence of hip fracture or dislocation. There is no evidence of arthropathy or other focal bone abnormality. IMPRESSION: Negative. Electronically Signed   By: Marijo Conception M.D.   On: 10/15/2018 12:19   Ct Head Code Stroke Wo Contrast  Result Date: 10/15/2018 CLINICAL DATA:  Code stroke. Facial paralysis. Possible stroke. Worsening left facial droop EXAM: CT HEAD WITHOUT CONTRAST TECHNIQUE: Contiguous axial images were obtained from the base of the skull through the vertex without intravenous contrast. COMPARISON:  CT and MRI head 06/13/2018 FINDINGS: Brain: Chronic infarct left posterior parietal lobe unchanged. Chronic microvascular ischemic change in the white matter unchanged. Mild atrophy. Negative for hydrocephalus. Chronic infarct in the right anterior corpus callosum unchanged. Chronic infarcts in the cerebellum bilaterally unchanged. Negative for acute infarct, hemorrhage, mass.  No midline shift. Vascular: Normal arterial flow voids. Skull: Negative Sinuses/Orbits: Negative Other: None ASPECTS ([place] Stroke Program Early CT Score) - Ganglionic level infarction (caudate, lentiform nuclei, internal capsule, insula, M1-M3 cortex): 7 - Supraganglionic infarction (M4-M6 cortex): 3 Total score (0-10 with 10 being normal): 10 IMPRESSION: 1. No acute abnormality 2. Atrophy and extensive chronic ischemic changes, stable from the prior study. 3. ASPECTS is 10 4. These results were called by telephone at the time of interpretation on 10/15/2018 at 11:38 am to provider [facility] , who verbally acknowledged these results. Electronically Signed   By: Franchot Gallo M.D.   On: 10/15/2018 11:39    EKG:   Orders placed or performed during the hospital encounter of 10/15/18  . ED EKG  . ED EKG  . EKG 12-Lead  . EKG 12-Lead      Management plans discussed with the patient, family and they are in agreement.  CODE STATUS:     Code  Status Orders  (From admission, onward)         Start     Ordered   10/15/18 1350  Do not attempt resuscitation (DNR)  Continuous    Question Answer Comment  In the event of cardiac or respiratory ARREST Do not call a "code blue"   In the event of cardiac or respiratory ARREST Do not perform Intubation, CPR, defibrillation or ACLS   In the event of cardiac or respiratory ARREST Use medication by any route, position, wound care, and other measures to relive pain and suffering. May use oxygen, suction and manual treatment of airway obstruction as needed for comfort.   Comments nurse may pronounce.  Patient more awake and alert and  able to make her own medical decisions.  Clearly wishes to be DNR.      10/15/18 1350        Code Status History    Date Active Date Inactive Code Status Order ID Comments User Context   10/15/2018 1350 10/15/2018 1350 Full Code [id number]  Bettey Costa, MD Inpatient   06/13/2018 1650 06/15/2018 1430 DNR [id number]  Otila Back, MD ED   03/19/2018 0040 03/19/2018 2120 DNR [id number]  Sela Hua, MD Inpatient   03/13/2018 1042 03/17/2018 2043 DNR [id number]  Loletha Grayer, MD ED   10/20/2017 1704 10/21/2017 2006 DNR [id number]  Epifanio Lesches, MD ED   09/22/2017 2207 09/24/2017 1732 DNR [id number]  Rise Patience, MD ED   07/30/2017 1420 08/03/2017 1655 DNR [id number]  Karmen Bongo, MD ED   07/06/2017 1313 07/11/2017 2202 DNR [id number]  Fritzi Mandes, MD Inpatient   04/03/2017 2010 04/08/2017 2102 DNR [id number]  Demetrios Loll, MD Inpatient   03/27/2017 1519 04/02/2017 1517 Full Code [id number]  Saundra Shelling, MD Inpatient   02/18/2017 1623 02/21/2017 1919 Full Code [id number]  Salary, Avel Peace, MD Inpatient   10/03/2016 1841 10/04/2016 1739 Full Code [id number]  Demetrios Loll, MD Inpatient   02/27/2016 1744 02/28/2016 2034 Full Code [id number]  Vaughan Basta, MD Inpatient   02/19/2016 1553 02/21/2016 2041 Full Code [id number]  Gladstone Lighter, MD Inpatient   12/27/2015 1309  01/01/2016 1833 DNR [id number]  Loletha Grayer, MD ED   09/16/2015 1852 09/19/2015 1929 Full Code [id number]  Baxter Hire, MD Inpatient   08/16/2015 1714 08/19/2015 1735 DNR [id number]  Willia Craze, NP Inpatient   Advance Care Planning Activity      TOTAL TIME TAKING CARE OF THIS PATIENT: 45  minutes.   Note: This dictation was prepared with Dragon dictation along with smaller phrase technology. Any transcriptional errors that result from this process are unintentional.   @[facility] @  on 10/16/2018 at 10:43 AM  Between 7am to 6pm - Pager - [phone]  After 6pm go to [email or web address] - password [email or web address] [facility]  Office  [phone]  CC: Primary care physician; Tracie Harrier, MD

## 2018-10-16 NOTE — Discharge Instructions (Signed)
Follow-up with primary care physician in 3 to 4 days Follow-up with outpatient primary neurology as recommended or in a month

## 2018-10-16 NOTE — Progress Notes (Signed)
Resolved and close to baseline.   Past Medical History:  Diagnosis Date  . Acid reflux   . Arthritis    Bilateral knees, hands, feet  . Asthma   . Atrial fibrillation ([place])   . Cancer ([place])   . CHF (congestive heart failure) (HCC)    Diastolic CHF  . COPD (chronic obstructive pulmonary disease) ([place])   . Coronary artery disease   . Diabetes mellitus without complication ([place])   . Frequent headaches   . GI bleed   . HLD (hyperlipidemia)   . Hypertension   . Seizures ([place])   . Skin cancer 2015   Suspected basal cell carcinoma on nose, surgically removed  . Stroke ([place]) 04/2017  . Thyroid disease   . Tremors of nervous system     Past Surgical History:  Procedure Laterality Date  . ABDOMINAL HYSTERECTOMY  1976  . APPENDECTOMY  1980  . BACK SURGERY    . CAROTID ENDARTERECTOMY    . CHOLECYSTECTOMY  1980  . COLONOSCOPY N/A 02/20/2017   Procedure: COLONOSCOPY;  Surgeon: Lin Landsman, MD;  Location: [facility] ENDOSCOPY;  Service: Gastroenterology;  Laterality: N/A;  . ESOPHAGOGASTRODUODENOSCOPY N/A 02/20/2017   Procedure: ESOPHAGOGASTRODUODENOSCOPY (EGD);  Surgeon: Lin Landsman, MD;  Location: [facility] ENDOSCOPY;  Service: Gastroenterology;  Laterality: N/A;  . ESOPHAGOGASTRODUODENOSCOPY (EGD) WITH PROPOFOL N/A 07/08/2017   Procedure: ESOPHAGOGASTRODUODENOSCOPY (EGD) WITH PROPOFOL;  Surgeon: [name], MD;  Location: [facility] ENDOSCOPY;  Service: Endoscopy;  Laterality: N/A;  . [place]  . GIVENS CAPSULE STUDY N/A 07/09/2017   Procedure: GIVENS CAPSULE STUDY;  Surgeon: Jonathon Bellows, MD;  Location: [facility] ENDOSCOPY;  Service: Gastroenterology;  Laterality: N/A;  . KNEE SURGERY     left  . KYPHOPLASTY N/A 03/16/2018   Procedure: KYPHOPLASTY, L5;  Surgeon: Hessie Knows, MD;  Location: [facility] ORS;  Service: Orthopedics;  Laterality: N/A;  . ROTATOR CUFF REPAIR     right  . TONSILLECTOMY  1950    Family History  Problem Relation Age of Onset  . Breast cancer Mother    . Heart disease Mother   . Stroke Mother   . Cancer Mother   . COPD Mother   . Diabetes Mother   . Heart disease Father   . Diabetes Father   . Stroke Father   . Alcohol abuse Sister   . Drug abuse Sister   . Stroke Sister   . Cancer Sister   . Mental illness Sister   . Heart disease Brother   . Arthritis Brother   . Diabetes Brother   . Heart disease Maternal Grandfather   . Heart disease Paternal Grandfather    Social History:  reports that she has been smoking cigarettes. She has a 53.00 pack-year smoking history. She has never used smokeless tobacco. She reports that she does not drink alcohol or use drugs.  Allergies:  Allergies  Allergen Reactions  . Ivp Dye [Iodinated Diagnostic Agents] Itching    Pt injected with IV contrast.  5 min after injection pt complained of itching behind ear and on her abd.  . Methotrexate Rash  . Morphine And Related Other (See Comments)    "stops my breathing" NEAR-RESPIRATORY ARREST  . Mushroom Extract Complex Anaphylaxis    Made patient "deathly sick"  . Atenolol Other (See Comments)    "MD took me off of it bc it was doing something wrong" Made patient HYPOTENSIVE  . Percocet [Oxycodone-Acetaminophen] Nausea And Vomiting and Other (See Comments)    Stomach  pains  . Percodan [Oxycodone-Aspirin] Nausea And Vomiting and Other (See Comments)    Stomach pains  . Tramadol Nausea Only  . Verapamil Other (See Comments)    " MD told me this was screwing me up" MAKES PATIENT HYPOTENSIVE  . Oxycodone Hcl     Medications: I have reviewed the patient's current medications.    General ROS: negative for - chills, fatigue, fever, night sweats, weight gain or weight loss Psychological ROS: negative for - behavioral disorder, hallucinations, memory difficulties, mood swings or suicidal ideation Ophthalmic ROS: negative for - blurry vision, double vision, eye pain or loss of vision ENT ROS: negative for - epistaxis, nasal discharge, oral  lesions, sore throat, tinnitus or vertigo Allergy and Immunology ROS: negative for - hives or itchy/watery eyes Hematological and Lymphatic ROS: negative for - bleeding problems, bruising or swollen lymph nodes Endocrine ROS: negative for - galactorrhea, hair pattern changes, polydipsia/polyuria or temperature intolerance Respiratory ROS: negative for - cough, hemoptysis, shortness of breath or wheezing Cardiovascular ROS: negative for - chest pain, dyspnea on exertion, edema or irregular heartbeat Gastrointestinal ROS: negative for - abdominal pain, diarrhea, hematemesis, nausea/vomiting or stool incontinence Genito-Urinary ROS: negative for - dysuria, hematuria, incontinence or urinary frequency/urgency Musculoskeletal ROS: negative for - joint swelling or muscular weakness Neurological ROS: as noted in HPI Dermatological ROS: negative for rash and skin lesion changes  Physical Examination: Blood pressure 113/60, pulse 65, temperature 97.8 F (36.6 C), temperature source Oral, resp. rate 20, height 5\' 7"  (1.702 m), weight 79.8 kg, SpO2 98 %.   Neurological Examination   Mental Status: Alert, oriented, thought content appropriate.  Speech fluent without evidence of aphasia.  Able to follow 3 step commands without difficulty. Cranial Nerves: II: Discs flat bilaterally; Visual fields grossly normal, pupils equal, round, reactive to light and accommodation III,IV, VI: ptosis not present, extra-ocular motions intact bilaterally V,VII: smile symmetric, facial light touch sensation normal bilaterally VIII: hearing normal bilaterally IX,X: gag reflex present XI: bilateral shoulder shrug XII: midline tongue extension Motor: Right : Upper extremity   4/5    Left:     Upper extremity   3/5  Lower extremity   5/5     Lower extremity   5/5 Tone and bulk:normal tone throughout; no atrophy noted Sensory: Pinprick and light touch intact throughout, bilaterally Deep Tendon Reflexes: 1+ and  symmetric throughout Plantars: Right: downgoing   Left: downgoing Cerebellar: normal finger-to-nose, normal rapid alternating movements and normal heel-to-shin test Gait: not tested       Laboratory Studies:  Basic Metabolic Panel: Recent Labs  Lab 10/15/18 1118 10/15/18 1955 10/16/18 0601  NA 142  --   --   K 2.8* 2.8* 3.9  CL 105  --   --   CO2 24  --   --   GLUCOSE 107*  --   --   BUN 6*  --   --   CREATININE 0.68  --   --   CALCIUM 8.9  --   --   MG 1.9  --  1.9    Liver Function Tests: Recent Labs  Lab 10/15/18 1118  AST 24  ALT 20  ALKPHOS 120  BILITOT 0.7  PROT 7.1  ALBUMIN 4.0   No results for input(s): LIPASE, AMYLASE in the last 168 hours. No results for input(s): AMMONIA in the last 168 hours.  CBC: Recent Labs  Lab 10/15/18 1118  WBC 8.6  NEUTROABS 5.1  HGB 13.3  HCT 40.7  MCV  88.3  PLT 299    Cardiac Enzymes: No results for input(s): CKTOTAL, CKMB, CKMBINDEX, TROPONINI in the last 168 hours.  BNP: Invalid input(s): POCBNP  CBG: Recent Labs  Lab 10/15/18 1113 10/15/18 1700 10/15/18 2245 10/16/18 0814 10/16/18 1137  GLUCAP 106* 108* 115* 127* 128*    Microbiology: Results for orders placed or performed during the hospital encounter of 10/15/18  SARS CORONAVIRUS 2 (TAT 6-24 HRS) Nasopharyngeal Nasopharyngeal Swab     Status: None   Collection Time: 10/15/18 11:18 AM   Specimen: Nasopharyngeal Swab  Result Value Ref Range Status   SARS Coronavirus 2 NEGATIVE NEGATIVE Final    Comment: (NOTE) SARS-CoV-2 target nucleic acids are NOT DETECTED. The SARS-CoV-2 RNA is generally detectable in upper and lower respiratory specimens during the acute phase of infection. Negative results do not preclude SARS-CoV-2 infection, do not rule out co-infections with other pathogens, and should not be used as the sole basis for treatment or other patient management decisions. Negative results must be combined with clinical  observations, patient history, and epidemiological information. The expected result is Negative. Fact Sheet for Patients: SugarRoll.be Fact Sheet for Healthcare Providers: https://www.woods-mathews.com/ This test is not yet approved or cleared by the [place] FDA and  has been authorized for detection and/or diagnosis of SARS-CoV-2 by FDA under an Emergency Use Authorization (EUA). This EUA will remain  in effect (meaning this test can be used) for the duration of the COVID-19 declaration under Section 56 4(b)(1) of the Act, 21 U.S.C. section 360bbb-3(b)(1), unless the authorization is terminated or revoked sooner. Performed at [facility], [place] [address] [place], [place] [address]     Coagulation Studies: Recent Labs    10/15/18 1118  LABPROT 13.0  INR 1.0    Urinalysis: No results for input(s): COLORURINE, LABSPEC, PHURINE, GLUCOSEU, HGBUR, BILIRUBINUR, KETONESUR, PROTEINUR, UROBILINOGEN, NITRITE, LEUKOCYTESUR in the last 168 hours.  Invalid input(s): APPERANCEUR  Lipid Panel:    Component Value Date/Time   CHOL 167 10/16/2018 0601   TRIG 220 (H) 10/16/2018 0601   HDL 39 (L) 10/16/2018 0601   CHOLHDL 4.3 10/16/2018 0601   VLDL 44 (H) 10/16/2018 0601   LDLCALC 84 10/16/2018 0601    HgbA1C:  Lab Results  Component Value Date   HGBA1C 6.4 (H) 10/16/2018    Urine Drug Screen:      Component Value Date/Time   LABOPIA NONE DETECTED 03/13/2018 0943   LABOPIA NONE DETECTED 09/22/2017 1940   COCAINSCRNUR NONE DETECTED 03/13/2018 0943   LABBENZ NONE DETECTED 03/13/2018 0943   LABBENZ NONE DETECTED 09/22/2017 1940   AMPHETMU NONE DETECTED 03/13/2018 0943   AMPHETMU NONE DETECTED 09/22/2017 1940   THCU NONE DETECTED 03/13/2018 0943   THCU NONE DETECTED 09/22/2017 1940   LABBARB NONE DETECTED 03/13/2018 0943   LABBARB NONE DETECTED 09/22/2017 1940    Alcohol Level: No results for input(s): ETH in the last 168  hours.  Other results: EKG: normal EKG, normal sinus rhythm, unchanged from previous tracings.  Imaging: Mr Angio Head Wo Contrast  Result Date: 10/16/2018 CLINICAL DATA:  Left-sided weakness EXAM: MR HEAD WITHOUT CONTRAST MR CIRCLE OF WILLIS WITHOUT CONTRAST MRA OF THE NECK WITHOUT AND WITH CONTRAST TECHNIQUE: Multiplanar, multiecho pulse sequences of the brain, circle of willis and surrounding structures were obtained without intravenous contrast. Angiographic images of the neck were obtained using MRA technique without and with intravenous contrast. CONTRAST:  58mL GADAVIST GADOBUTROL 1 MMOL/ML IV SOLN COMPARISON:  CTA neck 09/22/2017 FINDINGS: Examination  is generally degraded by motion. MRI HEAD FINDINGS BRAIN: There is an old left cerebellar infarct. There is posterior left MCA distribution encephalomalacia. There is no acute infarct, acute hemorrhage or mass. Diffuse confluent hyperintense T2-weighted signal within the periventricular, deep and juxtacortical white matter, most commonly due to chronic ischemic microangiopathy. Old posterior left MCA and left PCA territory infarcts with associated encephalomalacia. Generalized mild volume loss otherwise. Blood-sensitive sequences show no chronic microhemorrhage or superficial siderosis. Limited assessment of previously demonstrated left para clinoid mass without IV contrast. SKULL AND UPPER CERVICAL SPINE: The visualized skull base, calvarium, upper cervical spine and extracranial soft tissues are normal. SINUSES/ORBITS: No fluid levels or advanced mucosal thickening. No mastoid or middle ear effusion. The orbits are normal. MRA HEAD FINDINGS POSTERIOR CIRCULATION: --Basilar artery: Normal. --Posterior cerebral arteries: Fetal origins of both --Superior cerebellar arteries: Normal. --Inferior cerebellar arteries: Normal anterior and posterior inferior cerebellar arteries. ANTERIOR CIRCULATION: --Intracranial internal carotid arteries: Normal. --Anterior  cerebral arteries: Normal. Both A1 segments are present. Patent anterior communicating artery. --Middle cerebral arteries: Normal. --Posterior communicating arteries: Present bilaterally MRA NECK FINDINGS Aortic arch: Normal 3 vessel aortic branching pattern. The visualized subclavian arteries are normal. Right carotid system: Normal course and caliber without stenosis or evidence of dissection. Left carotid system: Normal course and caliber without stenosis or evidence of dissection. Vertebral arteries: Right. Vertebral artery origins are poorly visualized. Vertebral arteries are normal in course and caliber to the vertebrobasilar confluence without stenosis or evidence of dissection. IMPRESSION: 1. No acute intracranial process. 2. Old left MCA and left PCA territory infarcts and severe chronic small vessel disease. 3. No emergent large vessel occlusion or high-grade stenosis. 4. Limited examination due to motion artifact. No hemodynamically significant stenosis of the carotid or vertebral arteries in the neck. 5. Limited assessment of previously demonstrated left paraclinoid mass without IV contrast material. Electronically Signed   By: Ulyses Jarred M.D.   On: 10/16/2018 01:36   Mr Angio Neck W Wo Contrast  Result Date: 10/16/2018 CLINICAL DATA:  Left-sided weakness EXAM: MR HEAD WITHOUT CONTRAST MR CIRCLE OF WILLIS WITHOUT CONTRAST MRA OF THE NECK WITHOUT AND WITH CONTRAST TECHNIQUE: Multiplanar, multiecho pulse sequences of the brain, circle of willis and surrounding structures were obtained without intravenous contrast. Angiographic images of the neck were obtained using MRA technique without and with intravenous contrast. CONTRAST:  67mL GADAVIST GADOBUTROL 1 MMOL/ML IV SOLN COMPARISON:  CTA neck 09/22/2017 FINDINGS: Examination is generally degraded by motion. MRI HEAD FINDINGS BRAIN: There is an old left cerebellar infarct. There is posterior left MCA distribution encephalomalacia. There is no acute  infarct, acute hemorrhage or mass. Diffuse confluent hyperintense T2-weighted signal within the periventricular, deep and juxtacortical white matter, most commonly due to chronic ischemic microangiopathy. Old posterior left MCA and left PCA territory infarcts with associated encephalomalacia. Generalized mild volume loss otherwise. Blood-sensitive sequences show no chronic microhemorrhage or superficial siderosis. Limited assessment of previously demonstrated left para clinoid mass without IV contrast. SKULL AND UPPER CERVICAL SPINE: The visualized skull base, calvarium, upper cervical spine and extracranial soft tissues are normal. SINUSES/ORBITS: No fluid levels or advanced mucosal thickening. No mastoid or middle ear effusion. The orbits are normal. MRA HEAD FINDINGS POSTERIOR CIRCULATION: --Basilar artery: Normal. --Posterior cerebral arteries: Fetal origins of both --Superior cerebellar arteries: Normal. --Inferior cerebellar arteries: Normal anterior and posterior inferior cerebellar arteries. ANTERIOR CIRCULATION: --Intracranial internal carotid arteries: Normal. --Anterior cerebral arteries: Normal. Both A1 segments are present. Patent anterior communicating artery. --Middle cerebral arteries: Normal. --  Posterior communicating arteries: Present bilaterally MRA NECK FINDINGS Aortic arch: Normal 3 vessel aortic branching pattern. The visualized subclavian arteries are normal. Right carotid system: Normal course and caliber without stenosis or evidence of dissection. Left carotid system: Normal course and caliber without stenosis or evidence of dissection. Vertebral arteries: Right. Vertebral artery origins are poorly visualized. Vertebral arteries are normal in course and caliber to the vertebrobasilar confluence without stenosis or evidence of dissection. IMPRESSION: 1. No acute intracranial process. 2. Old left MCA and left PCA territory infarcts and severe chronic small vessel disease. 3. No emergent large  vessel occlusion or high-grade stenosis. 4. Limited examination due to motion artifact. No hemodynamically significant stenosis of the carotid or vertebral arteries in the neck. 5. Limited assessment of previously demonstrated left paraclinoid mass without IV contrast material. Electronically Signed   By: Ulyses Jarred M.D.   On: 10/16/2018 01:36   [name]  Result Date: 10/16/2018 CLINICAL DATA:  Left-sided weakness EXAM: MR HEAD WITHOUT CONTRAST MR CIRCLE OF WILLIS WITHOUT CONTRAST MRA OF THE NECK WITHOUT AND WITH CONTRAST TECHNIQUE: Multiplanar, multiecho pulse sequences of the brain, circle of willis and surrounding structures were obtained without intravenous contrast. Angiographic images of the neck were obtained using MRA technique without and with intravenous contrast. CONTRAST:  64mL GADAVIST GADOBUTROL 1 MMOL/ML IV SOLN COMPARISON:  CTA neck 09/22/2017 FINDINGS: Examination is generally degraded by motion. MRI HEAD FINDINGS BRAIN: There is an old left cerebellar infarct. There is posterior left MCA distribution encephalomalacia. There is no acute infarct, acute hemorrhage or mass. Diffuse confluent hyperintense T2-weighted signal within the periventricular, deep and juxtacortical white matter, most commonly due to chronic ischemic microangiopathy. Old posterior left MCA and left PCA territory infarcts with associated encephalomalacia. Generalized mild volume loss otherwise. Blood-sensitive sequences show no chronic microhemorrhage or superficial siderosis. Limited assessment of previously demonstrated left para clinoid mass without IV contrast. SKULL AND UPPER CERVICAL SPINE: The visualized skull base, calvarium, upper cervical spine and extracranial soft tissues are normal. SINUSES/ORBITS: No fluid levels or advanced mucosal thickening. No mastoid or middle ear effusion. The orbits are normal. MRA HEAD FINDINGS POSTERIOR CIRCULATION: --Basilar artery: Normal. --Posterior cerebral arteries:  Fetal origins of both --Superior cerebellar arteries: Normal. --Inferior cerebellar arteries: Normal anterior and posterior inferior cerebellar arteries. ANTERIOR CIRCULATION: --Intracranial internal carotid arteries: Normal. --Anterior cerebral arteries: Normal. Both A1 segments are present. Patent anterior communicating artery. --Middle cerebral arteries: Normal. --Posterior communicating arteries: Present bilaterally MRA NECK FINDINGS Aortic arch: Normal 3 vessel aortic branching pattern. The visualized subclavian arteries are normal. Right carotid system: Normal course and caliber without stenosis or evidence of dissection. Left carotid system: Normal course and caliber without stenosis or evidence of dissection. Vertebral arteries: Right. Vertebral artery origins are poorly visualized. Vertebral arteries are normal in course and caliber to the vertebrobasilar confluence without stenosis or evidence of dissection. IMPRESSION: 1. No acute intracranial process. 2. Old left MCA and left PCA territory infarcts and severe chronic small vessel disease. 3. No emergent large vessel occlusion or high-grade stenosis. 4. Limited examination due to motion artifact. No hemodynamically significant stenosis of the carotid or vertebral arteries in the neck. 5. Limited assessment of previously demonstrated left paraclinoid mass without IV contrast material. Electronically Signed   By: Ulyses Jarred M.D.   On: 10/16/2018 01:36   Dg Chest Port 1 View  Result Date: 10/16/2018 CLINICAL DATA:  Smoker.  Recent fall.  COPD. EXAM: PORTABLE CHEST 1 VIEW COMPARISON:  March 18, 2018  FINDINGS: The heart size is unchanged. Aortic calcifications are noted. There is no pneumothorax. No large pleural effusion. No acute osseous abnormality. IMPRESSION: No active disease. Electronically Signed   By: Constance Holster M.D.   On: 10/16/2018 10:34   Dg Hip Unilat W Or Wo Pelvis 2-3 Views Left  Result Date: 10/15/2018 CLINICAL DATA:  Left hip  pain after fall 5 days ago. EXAM: DG HIP (WITH OR WITHOUT PELVIS) 2-3V LEFT COMPARISON:  Radiographs of March 17, 2018. FINDINGS: There is no evidence of hip fracture or dislocation. There is no evidence of arthropathy or other focal bone abnormality. IMPRESSION: Negative. Electronically Signed   By: Marijo Conception M.D.   On: 10/15/2018 12:19   Ct Head Code Stroke Wo Contrast  Result Date: 10/15/2018 CLINICAL DATA:  Code stroke. Facial paralysis. Possible stroke. Worsening left facial droop EXAM: CT HEAD WITHOUT CONTRAST TECHNIQUE: Contiguous axial images were obtained from the base of the skull through the vertex without intravenous contrast. COMPARISON:  CT and MRI head 06/13/2018 FINDINGS: Brain: Chronic infarct left posterior parietal lobe unchanged. Chronic microvascular ischemic change in the white matter unchanged. Mild atrophy. Negative for hydrocephalus. Chronic infarct in the right anterior corpus callosum unchanged. Chronic infarcts in the cerebellum bilaterally unchanged. Negative for acute infarct, hemorrhage, mass.  No midline shift. Vascular: Normal arterial flow voids. Skull: Negative Sinuses/Orbits: Negative Other: None ASPECTS ([place] Stroke Program Early CT Score) - Ganglionic level infarction (caudate, lentiform nuclei, internal capsule, insula, M1-M3 cortex): 7 - Supraganglionic infarction (M4-M6 cortex): 3 Total score (0-10 with 10 being normal): 10 IMPRESSION: 1. No acute abnormality 2. Atrophy and extensive chronic ischemic changes, stable from the prior study. 3. ASPECTS is 10 4. These results were called by telephone at the time of interpretation on 10/15/2018 at 11:38 am to provider [facility] , who verbally acknowledged these results. Electronically Signed   By: Franchot Gallo M.D.   On: 10/15/2018 11:39    Assessment: 73 y.o. female  with hx of CHF, DM, HTN, prior stroke with residual L sided weakness. At baseline patient walk with a walker or cane due to residual L sided  weakness. Pt also states she fell last weekend and has L hip pain.  She reports increased numbness on L side and worse LLE weakness. Was on ASA 81mg  at home.   Last known well at [place]. No TPA as minimal new weakness that is off from baseline  Stroke Risk Factors - diabetes mellitus, family history and hypertension  Plan: Difficult historian but appears close to baseline MRI no acute abnormalities D/c planning today ASA 325 daily    10/16/2018, 12:20 PM

## 2018-10-16 NOTE — Consult Note (Signed)
PHARMACY CONSULT NOTE - FOLLOW UP  Pharmacy Consult for Electrolyte Monitoring and Replacement   Recent Labs: Potassium (mmol/L)  Date Value  10/16/2018 3.9   Magnesium (mg/dL)  Date Value  10/16/2018 1.9   Calcium (mg/dL)  Date Value  10/15/2018 8.9   Albumin (g/dL)  Date Value  10/15/2018 4.0   Phosphorus (mg/dL)  Date Value  07/06/2017 3.8   Sodium (mmol/L)  Date Value  10/15/2018 142     Assessment: Pharmacy has been consulted to monitor/replenish electrolytes in this 73 yo female with hypokalemia being seen for possible stroke.  Goal of Therapy:  Electrolytes wnl's  Plan:  No further replacement warranted. Patient with discharge orders. Will order BMP with am labs and follow along if patient is still hospitalized.   Pharmacy will continue to monitor and adjust per consult.   MLS, RPh 10/16/2018 11:47 AM

## 2018-10-17 DIAGNOSIS — R531 Weakness: Secondary | ICD-10-CM | POA: Diagnosis not present

## 2018-10-17 DIAGNOSIS — Z716 Tobacco abuse counseling: Secondary | ICD-10-CM | POA: Diagnosis not present

## 2018-10-17 DIAGNOSIS — I480 Paroxysmal atrial fibrillation: Secondary | ICD-10-CM | POA: Diagnosis not present

## 2018-10-17 DIAGNOSIS — J449 Chronic obstructive pulmonary disease, unspecified: Secondary | ICD-10-CM | POA: Diagnosis not present

## 2018-10-17 LAB — BASIC METABOLIC PANEL
Anion gap: 5 (ref 5–15)
BUN: 12 mg/dL (ref 8–23)
CO2: 26 mmol/L (ref 22–32)
Calcium: 8.4 mg/dL — ABNORMAL LOW (ref 8.9–10.3)
Chloride: 108 mmol/L (ref 98–111)
Creatinine, Ser: 0.69 mg/dL (ref 0.44–1.00)
GFR calc Af Amer: >60 mL/min (ref >60)
GFR calc non Af Amer: >60 mL/min (ref >60)
Glucose, Bld: 126 mg/dL — ABNORMAL HIGH (ref 70–99)
Potassium: 3.8 mmol/L (ref 3.5–5.1)
Sodium: 139 mmol/L (ref 135–145)

## 2018-10-17 LAB — GLUCOSE, CAPILLARY
Glucose-Capillary: 134 mg/dL — ABNORMAL HIGH (ref 70–99)
Glucose-Capillary: 134 mg/dL — ABNORMAL HIGH (ref 70–99)
Glucose-Capillary: 106 mg/dL — ABNORMAL HIGH (ref 70–99)
Glucose-Capillary: 119 mg/dL — ABNORMAL HIGH (ref 70–99)

## 2018-10-17 NOTE — Discharge Summary (Signed)
Samantha Aguirre at [facility] NAME: Samantha Aguirre    MR#:  [id number]  DATE OF BIRTH:  1945/03/07  DATE OF ADMISSION:  10/15/2018 ADMITTING PHYSICIAN: Bettey Costa, MD  DATE OF DISCHARGE: 10/17/18 10/16/18 PRIMARY CARE PHYSICIAN: Tracie Harrier, MD    ADMISSION DIAGNOSIS:  Weakness [R53.1]  DISCHARGE DIAGNOSIS:  Left-sided weakness  SECONDARY DIAGNOSIS:   Past Medical History:  Diagnosis Date  . Acid reflux   . Arthritis    Bilateral knees, hands, feet  . Asthma   . Atrial fibrillation ([place])   . Cancer ([place])   . CHF (congestive heart failure) ([facility])    Diastolic CHF  . COPD (chronic obstructive pulmonary disease) ([place])   . Coronary artery disease   . Diabetes mellitus without complication ([place])   . Frequent headaches   . GI bleed   . HLD (hyperlipidemia)   . Hypertension   . Seizures ([place])   . Skin cancer 2015   Suspected basal cell carcinoma on nose, surgically removed  . Stroke ([place]) 04/2017  . Thyroid disease   . Tremors of nervous system     HOSPITAL COURSE:   HPI  Samantha Aguirre  is a 73 y.o. female with a known history of previous CVA with residual left-sided weakness, chronic diastolic heart failure and COPD who presents to the emergency room due to left-sided weakness. Patient walks with a walker at baseline due to her left-sided weakness.  Patient reports that this morning she had increased left-sided weakness especially left lower extremity weakness.  Patient has been evaluated by neurology.  Patient is close to baseline and risk for TPA according to her neurologist.  1.Left-sided weakness-stroke ruled out Patient evaluated by neurology and not a TPA candidate. Negative MRA/MRI of the brain, stroke ruled out  Patient has chronic left-sided weakness from previous stroke, okay to discharge patient from neurology standpoint with aspirin 81 mg  Outpatient follow-up with neurology as recommended or in a month  fasting lipid LDL at 84 Aspirin and Plavix her home medications will be continued Continue statin PT, OT evaluation-recommending skilled nursing facility  speech consultation-no needs identified Patient is a wheelchair, walker bound at home at her baseline  2. HypoThyroidism: Continue Synthroid  3.  Diabetes:A1c- 6.4  Sliding scale provided during the hospital course  ADA diet Metformin  Diabetes nurse consult has seen the patient during the hospital stay  4.  Depression: Continue Cymbalta  5.  PAF: Patient in normal sinus rhythm Continue telemetry  6. Tobacco dependence: Patient is encouraged to quit smoking and willing to attempt to quit was assessed. Patient highly motivated.Counseling was provided for 4 minutes by the admitting physician  7.  COPD without signs of exacerbation  8.  Chronic diastolic heart failure: No signs of exacerbation Continue Lasix  Disposition skilled nursing facility awaiting placement social worker is following  DISCHARGE CONDITIONS:  fair  CONSULTS OBTAINED:  Treatment Team:  Leotis Pain, MD   PROCEDURES  None   DRUG ALLERGIES:   Allergies  Allergen Reactions  . Ivp Dye [Iodinated Diagnostic Agents] Itching    Pt injected with IV contrast.  5 min after injection pt complained of itching behind ear and on her abd.  . Methotrexate Rash  . Morphine And Related Other (See Comments)    "stops my breathing" NEAR-RESPIRATORY ARREST  . Mushroom Extract Complex Anaphylaxis    Made patient "deathly sick"  . Atenolol Other (See Comments)    "MD took me  off of it bc it was doing something wrong" Made patient HYPOTENSIVE  . Percocet [Oxycodone-Acetaminophen] Nausea And Vomiting and Other (See Comments)    Stomach pains  . Percodan [Oxycodone-Aspirin] Nausea And Vomiting and Other (See Comments)    Stomach pains  . Tramadol Nausea Only  . Verapamil Other (See Comments)    " MD told me this was screwing me up" MAKES PATIENT  HYPOTENSIVE  . Oxycodone Hcl     DISCHARGE MEDICATIONS:   Allergies as of 10/17/2018      Reactions   Ivp Dye [iodinated Diagnostic Agents] Itching   Pt injected with IV contrast.  5 min after injection pt complained of itching behind ear and on her abd.   Methotrexate Rash   Morphine And Related Other (See Comments)   "stops my breathing" NEAR-RESPIRATORY ARREST   Mushroom Extract Complex Anaphylaxis   Made patient "deathly sick"   Atenolol Other (See Comments)   "MD took me off of it bc it was doing something wrong" Made patient HYPOTENSIVE   Percocet [oxycodone-acetaminophen] Nausea And Vomiting, Other (See Comments)   Stomach pains   Percodan [oxycodone-aspirin] Nausea And Vomiting, Other (See Comments)   Stomach pains   Tramadol Nausea Only   Verapamil Other (See Comments)   " MD told me this was screwing me up" MAKES PATIENT HYPOTENSIVE   Oxycodone Hcl       Medication List    STOP taking these medications   diclofenac sodium 1 % Gel Commonly known as: VOLTAREN   fluticasone 50 MCG/ACT nasal spray Commonly known as: FLONASE   lisinopril 10 MG tablet Commonly known as: ZESTRIL   sucralfate 1 g tablet Commonly known as: CARAFATE     TAKE these medications   albuterol 108 (90 Base) MCG/ACT inhaler Commonly known as: VENTOLIN HFA Inhale 2 puffs into the lungs every 6 (six) hours as needed for wheezing or shortness of breath.   aspirin 81 MG chewable tablet Commonly known as: Aspirin Childrens Chew 1 tablet (81 mg total) by mouth daily.   atorvastatin 80 MG tablet Commonly known as: LIPITOR Take 1 tablet (80 mg total) by mouth daily at 6 PM.   azelastine 0.1 % nasal spray Commonly known as: ASTELIN Place 2 sprays into both nostrils 2 (two) times daily.   clopidogrel 75 MG tablet Commonly known as: PLAVIX Take 75 mg by mouth daily.   colestipol 1 g tablet Commonly known as: COLESTID Take 2 g by mouth 2 (two) times daily.   DULoxetine 60 MG  capsule Commonly known as: CYMBALTA Take 1 capsule (60 mg total) by mouth at bedtime.   ferrous sulfate 325 (65 FE) MG EC tablet Take 1 tablet (325 mg total) by mouth 2 (two) times daily with a meal. What changed: when to take this   furosemide 20 MG tablet Commonly known as: LASIX Take 20 mg by mouth 2 (two) times daily.   gabapentin 800 MG tablet Commonly known as: NEURONTIN Take 1 tablet (800 mg total) by mouth 2 (two) times daily.   glipiZIDE-metformin 5-500 MG tablet Commonly known as: METAGLIP Take 1 tablet by mouth 2 (two) times daily before a meal. What changed:   how much to take  when to take this  additional instructions   levETIRAcetam 500 MG tablet Commonly known as: KEPPRA Take 1 tablet (500 mg total) by mouth 2 (two) times daily.   levothyroxine 112 MCG tablet Commonly known as: SYNTHROID Take 1 tablet (112 mcg total) by mouth daily  before breakfast.   nitroGLYCERIN 0.4 MG SL tablet Commonly known as: NITROSTAT Place 1 tablet (0.4 mg total) under the tongue every 5 (five) minutes as needed for chest pain.   omeprazole 20 MG capsule Commonly known as: PRILOSEC Take 20 mg by mouth 2 (two) times daily.   potassium chloride 10 MEQ tablet Commonly known as: KLOR-CON Take 1 tablet (10 mEq total) by mouth daily.   topiramate 25 MG tablet Commonly known as: TOPAMAX Take 25 mg by mouth 2 (two) times daily.   umeclidinium bromide 62.5 MCG/INH Aepb Commonly known as: Incruse Ellipta Inhale 1 puff into the lungs daily.   vitamin B-12 500 MCG tablet Commonly known as: CYANOCOBALAMIN Take 500 mcg by mouth daily.        DISCHARGE INSTRUCTIONS:   Follow-up with primary care physician in 3 to 4 days Follow-up with outpatient primary neurology as recommended or in a month Skilled nursing facility  DIET:  Cardiac diet and Diabetic diet  DISCHARGE CONDITION:  Fair  ACTIVITY:  Activity as tolerated per PT  OXYGEN:  Home Oxygen: No.   Oxygen  Delivery: room air  DISCHARGE LOCATION:  snf  If you experience worsening of your admission symptoms, develop shortness of breath, life threatening emergency, suicidal or homicidal thoughts you must seek medical attention immediately by calling 911 or calling your MD immediately  if symptoms less severe.  You Must read complete instructions/literature along with all the possible adverse reactions/side effects for all the Medicines you take and that have been prescribed to you. Take any new Medicines after you have completely understood and accpet all the possible adverse reactions/side effects.   Please note  You were cared for by a hospitalist during your hospital stay. If you have any questions about your discharge medications or the care you received while you were in the hospital after you are discharged, you can call the unit and asked to speak with the hospitalist on call if the hospitalist that took care of you is not available. Once you are discharged, your primary care physician will handle any further medical issues. Please note that NO REFILLS for any discharge medications will be authorized once you are discharged, as it is imperative that you return to your primary care physician (or establish a relationship with a primary care physician if you do not have one) for your aftercare needs so that they can reassess your need for medications and monitor your lab values.     Today  Chief Complaint  Patient presents with  . Weakness   Patient is feeling fine.  Has chronic left-sided weakness from previous stroke otherwise no new complaints.  Okay to discharge patient from neurology standpoint .  Daughter cannot supervise the patient 24 hours Physical therapy is recommending skilled nursing facility  ROS:  CONSTITUTIONAL: Denies fevers, chills. Denies any fatigue, weakness.  EYES: Denies blurry vision, double vision, eye pain. EARS, NOSE, THROAT: Denies tinnitus, ear pain, hearing  loss. RESPIRATORY: Denies cough, wheeze, shortness of breath.  CARDIOVASCULAR: Denies chest pain, palpitations, edema.  GASTROINTESTINAL: Denies nausea, vomiting, diarrhea, abdominal pain. Denies bright red blood per rectum. GENITOURINARY: Denies dysuria, hematuria. ENDOCRINE: Denies nocturia or thyroid problems. HEMATOLOGIC AND LYMPHATIC: Denies easy bruising or bleeding. SKIN: Denies rash or lesion. MUSCULOSKELETAL: Denies pain in neck, back, shoulder, knees, hips or arthritic symptoms.  NEUROLOGIC: Chronic left-sided weakness PSYCHIATRIC: Denies anxiety or depressive symptoms.   VITAL SIGNS:  Blood pressure 118/66, pulse 63, temperature 98.1 F (36.7 C), temperature source  Oral, resp. rate 18, height 5\' 7"  (1.702 m), weight 79.8 kg, SpO2 97 %.  I/O:    Intake/Output Summary (Last 24 hours) at 10/17/2018 1323 Last data filed at 10/17/2018 0939 Gross per 24 hour  Intake 240 ml  Output -  Net 240 ml    PHYSICAL EXAMINATION:  GENERAL:  73 y.o.-year-old patient lying in the bed with no acute distress.  EYES: Pupils equal, round, reactive to light and accommodation. No scleral icterus. Extraocular muscles intact.  HEENT: Head atraumatic, normocephalic. Oropharynx and nasopharynx clear.  NECK:  Supple, no jugular venous distention. No thyroid enlargement, no tenderness.  LUNGS: Normal breath sounds bilaterally, no wheezing, rales,rhonchi or crepitation. No use of accessory muscles of respiration.  CARDIOVASCULAR: S1, S2 normal. No murmurs, rubs, or gallops.  ABDOMEN: Soft, non-tender, non-distended. Bowel sounds present.  EXTREMITIES: No pedal edema, cyanosis, or clubbing.  NEUROLOGIC: Cranial nerves II through XII are intact. Muscle strength 5/5 in all right-sided extremities, 3-4 out of 5 on left extremities. Sensation intact. Gait not checked.  PSYCHIATRIC: The patient is alert and oriented x 3.  SKIN: No obvious rash, lesion, or ulcer.   DATA REVIEW:   CBC Recent Labs  Lab  10/15/18 1118  WBC 8.6  HGB 13.3  HCT 40.7  PLT 299    Chemistries  Recent Labs  Lab 10/15/18 1118  10/16/18 0601 10/17/18 0506  NA 142  --   --  139  K 2.8*   < > 3.9 3.8  CL 105  --   --  108  CO2 24  --   --  26  GLUCOSE 107*  --   --  126*  BUN 6*  --   --  12  CREATININE 0.68  --   --  0.69  CALCIUM 8.9  --   --  8.4*  MG 1.9  --  1.9  --   AST 24  --   --   --   ALT 20  --   --   --   ALKPHOS 120  --   --   --   BILITOT 0.7  --   --   --    < > = values in this interval not displayed.    Cardiac Enzymes No results for input(s): TROPONINI in the last 168 hours.  Microbiology Results  Results for orders placed or performed during the hospital encounter of 10/15/18  SARS CORONAVIRUS 2 (TAT 6-24 HRS) Nasopharyngeal Nasopharyngeal Swab     Status: None   Collection Time: 10/15/18 11:18 AM   Specimen: Nasopharyngeal Swab  Result Value Ref Range Status   SARS Coronavirus 2 NEGATIVE NEGATIVE Final    Comment: (NOTE) SARS-CoV-2 target nucleic acids are NOT DETECTED. The SARS-CoV-2 RNA is generally detectable in upper and lower respiratory specimens during the acute phase of infection. Negative results do not preclude SARS-CoV-2 infection, do not rule out co-infections with other pathogens, and should not be used as the sole basis for treatment or other patient management decisions. Negative results must be combined with clinical observations, patient history, and epidemiological information. The expected result is Negative. Fact Sheet for Patients: SugarRoll.be Fact Sheet for Healthcare Providers: https://www.woods-mathews.com/ This test is not yet approved or cleared by the [place] FDA and  has been authorized for detection and/or diagnosis of SARS-CoV-2 by FDA under an Emergency Use Authorization (EUA). This EUA will remain  in effect (meaning this test can be used) for the duration of the COVID-19  declaration  under Section 56 4(b)(1) of the Act, 21 U.S.C. section 360bbb-3(b)(1), unless the authorization is terminated or revoked sooner. Performed at [facility], [place] [address], [place], [place] [address]     RADIOLOGY:  Mr Angio Head Wo Contrast  Result Date: 10/16/2018 CLINICAL DATA:  Left-sided weakness EXAM: MR HEAD WITHOUT CONTRAST MR CIRCLE OF WILLIS WITHOUT CONTRAST MRA OF THE NECK WITHOUT AND WITH CONTRAST TECHNIQUE: Multiplanar, multiecho pulse sequences of the brain, circle of willis and surrounding structures were obtained without intravenous contrast. Angiographic images of the neck were obtained using MRA technique without and with intravenous contrast. CONTRAST:  83mL GADAVIST GADOBUTROL 1 MMOL/ML IV SOLN COMPARISON:  CTA neck 09/22/2017 FINDINGS: Examination is generally degraded by motion. MRI HEAD FINDINGS BRAIN: There is an old left cerebellar infarct. There is posterior left MCA distribution encephalomalacia. There is no acute infarct, acute hemorrhage or mass. Diffuse confluent hyperintense T2-weighted signal within the periventricular, deep and juxtacortical white matter, most commonly due to chronic ischemic microangiopathy. Old posterior left MCA and left PCA territory infarcts with associated encephalomalacia. Generalized mild volume loss otherwise. Blood-sensitive sequences show no chronic microhemorrhage or superficial siderosis. Limited assessment of previously demonstrated left para clinoid mass without IV contrast. SKULL AND UPPER CERVICAL SPINE: The visualized skull base, calvarium, upper cervical spine and extracranial soft tissues are normal. SINUSES/ORBITS: No fluid levels or advanced mucosal thickening. No mastoid or middle ear effusion. The orbits are normal. MRA HEAD FINDINGS POSTERIOR CIRCULATION: --Basilar artery: Normal. --Posterior cerebral arteries: Fetal origins of both --Superior cerebellar arteries: Normal. --Inferior cerebellar arteries: Normal anterior and  posterior inferior cerebellar arteries. ANTERIOR CIRCULATION: --Intracranial internal carotid arteries: Normal. --Anterior cerebral arteries: Normal. Both A1 segments are present. Patent anterior communicating artery. --Middle cerebral arteries: Normal. --Posterior communicating arteries: Present bilaterally MRA NECK FINDINGS Aortic arch: Normal 3 vessel aortic branching pattern. The visualized subclavian arteries are normal. Right carotid system: Normal course and caliber without stenosis or evidence of dissection. Left carotid system: Normal course and caliber without stenosis or evidence of dissection. Vertebral arteries: Right. Vertebral artery origins are poorly visualized. Vertebral arteries are normal in course and caliber to the vertebrobasilar confluence without stenosis or evidence of dissection. IMPRESSION: 1. No acute intracranial process. 2. Old left MCA and left PCA territory infarcts and severe chronic small vessel disease. 3. No emergent large vessel occlusion or high-grade stenosis. 4. Limited examination due to motion artifact. No hemodynamically significant stenosis of the carotid or vertebral arteries in the neck. 5. Limited assessment of previously demonstrated left paraclinoid mass without IV contrast material. Electronically Signed   By: Ulyses Jarred M.D.   On: 10/16/2018 01:36   Mr Angio Neck W Wo Contrast  Result Date: 10/16/2018 CLINICAL DATA:  Left-sided weakness EXAM: MR HEAD WITHOUT CONTRAST MR CIRCLE OF WILLIS WITHOUT CONTRAST MRA OF THE NECK WITHOUT AND WITH CONTRAST TECHNIQUE: Multiplanar, multiecho pulse sequences of the brain, circle of willis and surrounding structures were obtained without intravenous contrast. Angiographic images of the neck were obtained using MRA technique without and with intravenous contrast. CONTRAST:  71mL GADAVIST GADOBUTROL 1 MMOL/ML IV SOLN COMPARISON:  CTA neck 09/22/2017 FINDINGS: Examination is generally degraded by motion. MRI HEAD FINDINGS  BRAIN: There is an old left cerebellar infarct. There is posterior left MCA distribution encephalomalacia. There is no acute infarct, acute hemorrhage or mass. Diffuse confluent hyperintense T2-weighted signal within the periventricular, deep and juxtacortical white matter, most commonly due to chronic ischemic microangiopathy. Old posterior left MCA and  left PCA territory infarcts with associated encephalomalacia. Generalized mild volume loss otherwise. Blood-sensitive sequences show no chronic microhemorrhage or superficial siderosis. Limited assessment of previously demonstrated left para clinoid mass without IV contrast. SKULL AND UPPER CERVICAL SPINE: The visualized skull base, calvarium, upper cervical spine and extracranial soft tissues are normal. SINUSES/ORBITS: No fluid levels or advanced mucosal thickening. No mastoid or middle ear effusion. The orbits are normal. MRA HEAD FINDINGS POSTERIOR CIRCULATION: --Basilar artery: Normal. --Posterior cerebral arteries: Fetal origins of both --Superior cerebellar arteries: Normal. --Inferior cerebellar arteries: Normal anterior and posterior inferior cerebellar arteries. ANTERIOR CIRCULATION: --Intracranial internal carotid arteries: Normal. --Anterior cerebral arteries: Normal. Both A1 segments are present. Patent anterior communicating artery. --Middle cerebral arteries: Normal. --Posterior communicating arteries: Present bilaterally MRA NECK FINDINGS Aortic arch: Normal 3 vessel aortic branching pattern. The visualized subclavian arteries are normal. Right carotid system: Normal course and caliber without stenosis or evidence of dissection. Left carotid system: Normal course and caliber without stenosis or evidence of dissection. Vertebral arteries: Right. Vertebral artery origins are poorly visualized. Vertebral arteries are normal in course and caliber to the vertebrobasilar confluence without stenosis or evidence of dissection. IMPRESSION: 1. No acute  intracranial process. 2. Old left MCA and left PCA territory infarcts and severe chronic small vessel disease. 3. No emergent large vessel occlusion or high-grade stenosis. 4. Limited examination due to motion artifact. No hemodynamically significant stenosis of the carotid or vertebral arteries in the neck. 5. Limited assessment of previously demonstrated left paraclinoid mass without IV contrast material. Electronically Signed   By: Ulyses Jarred M.D.   On: 10/16/2018 01:36   [name]  Result Date: 10/16/2018 CLINICAL DATA:  Left-sided weakness EXAM: MR HEAD WITHOUT CONTRAST MR CIRCLE OF WILLIS WITHOUT CONTRAST MRA OF THE NECK WITHOUT AND WITH CONTRAST TECHNIQUE: Multiplanar, multiecho pulse sequences of the brain, circle of willis and surrounding structures were obtained without intravenous contrast. Angiographic images of the neck were obtained using MRA technique without and with intravenous contrast. CONTRAST:  53mL GADAVIST GADOBUTROL 1 MMOL/ML IV SOLN COMPARISON:  CTA neck 09/22/2017 FINDINGS: Examination is generally degraded by motion. MRI HEAD FINDINGS BRAIN: There is an old left cerebellar infarct. There is posterior left MCA distribution encephalomalacia. There is no acute infarct, acute hemorrhage or mass. Diffuse confluent hyperintense T2-weighted signal within the periventricular, deep and juxtacortical white matter, most commonly due to chronic ischemic microangiopathy. Old posterior left MCA and left PCA territory infarcts with associated encephalomalacia. Generalized mild volume loss otherwise. Blood-sensitive sequences show no chronic microhemorrhage or superficial siderosis. Limited assessment of previously demonstrated left para clinoid mass without IV contrast. SKULL AND UPPER CERVICAL SPINE: The visualized skull base, calvarium, upper cervical spine and extracranial soft tissues are normal. SINUSES/ORBITS: No fluid levels or advanced mucosal thickening. No mastoid or middle ear  effusion. The orbits are normal. MRA HEAD FINDINGS POSTERIOR CIRCULATION: --Basilar artery: Normal. --Posterior cerebral arteries: Fetal origins of both --Superior cerebellar arteries: Normal. --Inferior cerebellar arteries: Normal anterior and posterior inferior cerebellar arteries. ANTERIOR CIRCULATION: --Intracranial internal carotid arteries: Normal. --Anterior cerebral arteries: Normal. Both A1 segments are present. Patent anterior communicating artery. --Middle cerebral arteries: Normal. --Posterior communicating arteries: Present bilaterally MRA NECK FINDINGS Aortic arch: Normal 3 vessel aortic branching pattern. The visualized subclavian arteries are normal. Right carotid system: Normal course and caliber without stenosis or evidence of dissection. Left carotid system: Normal course and caliber without stenosis or evidence of dissection. Vertebral arteries: Right. Vertebral artery origins are poorly visualized. Vertebral arteries  are normal in course and caliber to the vertebrobasilar confluence without stenosis or evidence of dissection. IMPRESSION: 1. No acute intracranial process. 2. Old left MCA and left PCA territory infarcts and severe chronic small vessel disease. 3. No emergent large vessel occlusion or high-grade stenosis. 4. Limited examination due to motion artifact. No hemodynamically significant stenosis of the carotid or vertebral arteries in the neck. 5. Limited assessment of previously demonstrated left paraclinoid mass without IV contrast material. Electronically Signed   By: Ulyses Jarred M.D.   On: 10/16/2018 01:36   Dg Chest Port 1 View  Result Date: 10/16/2018 CLINICAL DATA:  Smoker.  Recent fall.  COPD. EXAM: PORTABLE CHEST 1 VIEW COMPARISON:  March 18, 2018 FINDINGS: The heart size is unchanged. Aortic calcifications are noted. There is no pneumothorax. No large pleural effusion. No acute osseous abnormality. IMPRESSION: No active disease. Electronically Signed   By: Constance Holster M.D.   On: 10/16/2018 10:34   Dg Hip Unilat W Or Wo Pelvis 2-3 Views Left  Result Date: 10/15/2018 CLINICAL DATA:  Left hip pain after fall 5 days ago. EXAM: DG HIP (WITH OR WITHOUT PELVIS) 2-3V LEFT COMPARISON:  Radiographs of March 17, 2018. FINDINGS: There is no evidence of hip fracture or dislocation. There is no evidence of arthropathy or other focal bone abnormality. IMPRESSION: Negative. Electronically Signed   By: Marijo Conception M.D.   On: 10/15/2018 12:19   Ct Head Code Stroke Wo Contrast  Result Date: 10/15/2018 CLINICAL DATA:  Code stroke. Facial paralysis. Possible stroke. Worsening left facial droop EXAM: CT HEAD WITHOUT CONTRAST TECHNIQUE: Contiguous axial images were obtained from the base of the skull through the vertex without intravenous contrast. COMPARISON:  CT and MRI head 06/13/2018 FINDINGS: Brain: Chronic infarct left posterior parietal lobe unchanged. Chronic microvascular ischemic change in the white matter unchanged. Mild atrophy. Negative for hydrocephalus. Chronic infarct in the right anterior corpus callosum unchanged. Chronic infarcts in the cerebellum bilaterally unchanged. Negative for acute infarct, hemorrhage, mass.  No midline shift. Vascular: Normal arterial flow voids. Skull: Negative Sinuses/Orbits: Negative Other: None ASPECTS ([place] Stroke Program Early CT Score) - Ganglionic level infarction (caudate, lentiform nuclei, internal capsule, insula, M1-M3 cortex): 7 - Supraganglionic infarction (M4-M6 cortex): 3 Total score (0-10 with 10 being normal): 10 IMPRESSION: 1. No acute abnormality 2. Atrophy and extensive chronic ischemic changes, stable from the prior study. 3. ASPECTS is 10 4. These results were called by telephone at the time of interpretation on 10/15/2018 at 11:38 am to provider [facility] , who verbally acknowledged these results. Electronically Signed   By: Franchot Gallo M.D.   On: 10/15/2018 11:39    EKG:   Orders placed or performed during  the hospital encounter of 10/15/18  . ED EKG  . ED EKG  . EKG 12-Lead  . EKG 12-Lead      Management plans discussed with the patient, family and they are in agreement.  CODE STATUS:     Code Status Orders  (From admission, onward)         Start     Ordered   10/15/18 1350  Do not attempt resuscitation (DNR)  Continuous    Question Answer Comment  In the event of cardiac or respiratory ARREST Do not call a "code blue"   In the event of cardiac or respiratory ARREST Do not perform Intubation, CPR, defibrillation or ACLS   In the event of cardiac or respiratory ARREST Use medication by any route,  position, wound care, and other measures to relive pain and suffering. May use oxygen, suction and manual treatment of airway obstruction as needed for comfort.   Comments nurse may pronounce.  Patient more awake and alert and able to make her own medical decisions.  Clearly wishes to be DNR.      10/15/18 1350        Code Status History    Date Active Date Inactive Code Status Order ID Comments User Context   10/15/2018 1350 10/15/2018 1350 Full Code [id number]  Bettey Costa, MD Inpatient   06/13/2018 1650 06/15/2018 1430 DNR [id number]  Otila Back, MD ED   03/19/2018 0040 03/19/2018 2120 DNR [id number]  Sela Hua, MD Inpatient   03/13/2018 1042 03/17/2018 2043 DNR [id number]  Loletha Grayer, MD ED   10/20/2017 1704 10/21/2017 2006 DNR [id number]  Epifanio Lesches, MD ED   09/22/2017 2207 09/24/2017 1732 DNR [id number]  Rise Patience, MD ED   07/30/2017 1420 08/03/2017 1655 DNR [id number]  Karmen Bongo, MD ED   07/06/2017 1313 07/11/2017 2202 DNR [id number]  Fritzi Mandes, MD Inpatient   04/03/2017 2010 04/08/2017 2102 DNR [id number]  Demetrios Loll, MD Inpatient   03/27/2017 1519 04/02/2017 1517 Full Code [id number]  Saundra Shelling, MD Inpatient   02/18/2017 1623 02/21/2017 1919 Full Code [id number]  Salary, Avel Peace, MD Inpatient   10/03/2016 1841 10/04/2016 1739 Full Code [id number]  Demetrios Loll, MD  Inpatient   02/27/2016 1744 02/28/2016 2034 Full Code [id number]  Vaughan Basta, MD Inpatient   02/19/2016 1553 02/21/2016 2041 Full Code [id number]  Gladstone Lighter, MD Inpatient   12/27/2015 1309 01/01/2016 1833 DNR [id number]  Loletha Grayer, MD ED   09/16/2015 1852 09/19/2015 1929 Full Code [id number]  Baxter Hire, MD Inpatient   08/16/2015 1714 08/19/2015 1735 DNR [id number]  Willia Craze, NP Inpatient   Advance Care Planning Activity      TOTAL TIME TAKING CARE OF THIS PATIENT: 45  minutes.   Note: This dictation was prepared with Dragon dictation along with smaller phrase technology. Any transcriptional errors that result from this process are unintentional.   @MEC @  on 10/17/2018 at 1:23 PM  Between 7am to 6pm - Pager - [phone]  After 6pm go to [email or web address] - password [email or web address] [facility]  Office  [phone]  CC: Primary care physician; Tracie Harrier, MD

## 2018-10-17 NOTE — Consult Note (Addendum)
PHARMACY CONSULT NOTE - FOLLOW UP  Pharmacy Consult for Electrolyte Monitoring and Replacement   Recent Labs: Potassium (mmol/L)  Date Value  10/17/2018 3.8   Magnesium (mg/dL)  Date Value  10/16/2018 1.9   Calcium (mg/dL)  Date Value  10/17/2018 8.4 (L)   Albumin (g/dL)  Date Value  10/15/2018 4.0   Phosphorus (mg/dL)  Date Value  07/06/2017 3.8   Sodium (mmol/L)  Date Value  10/17/2018 139     Assessment: Pharmacy has been consulted to monitor/replenish electrolytes in this 73 yo female with hypokalemia being seen for possible stroke. Patient ordered furosemide 20mg  PO BID.   Goal of Therapy:  Electrolytes within normal limits  Plan:  No further replacement warranted. Will obtain follow up BMP with am labs.    Pharmacy will continue to monitor and adjust per consult.   MLS, RPh 10/17/2018 10:30 AM

## 2018-10-17 NOTE — Progress Notes (Signed)
[place] at [place] NAME: Samantha Aguirre    MR#:  [id number]  DATE OF BIRTH:  Jul 02, 1945  SUBJECTIVE:  CHIEF COMPLAINT: Patient is resting comfortably left-sided weakness is slightly better than yesterday but patient has chronic left-sided weakness from old stroke  REVIEW OF SYSTEMS:  CONSTITUTIONAL: No fever, fatigue or weakness.  EYES: No blurred or double vision.  EARS, NOSE, AND THROAT: No tinnitus or ear pain.  RESPIRATORY: No cough, shortness of breath, wheezing or hemoptysis.  CARDIOVASCULAR: No chest pain, orthopnea, edema.  GASTROINTESTINAL: No nausea, vomiting, diarrhea or abdominal pain.  GENITOURINARY: No dysuria, hematuria.  ENDOCRINE: No polyuria, nocturia,  HEMATOLOGY: No anemia, easy bruising or bleeding SKIN: No rash or lesion. MUSCULOSKELETAL: No joint pain or arthritis.   NEUROLOGIC: No tingling, numbness, weakness.  PSYCHIATRY: No anxiety or depression.   DRUG ALLERGIES:   Allergies  Allergen Reactions  . Ivp Dye [Iodinated Diagnostic Agents] Itching    Pt injected with IV contrast.  5 min after injection pt complained of itching behind ear and on her abd.  . Methotrexate Rash  . Morphine And Related Other (See Comments)    "stops my breathing" NEAR-RESPIRATORY ARREST  . Mushroom Extract Complex Anaphylaxis    Made patient "deathly sick"  . Atenolol Other (See Comments)    "MD took me off of it bc it was doing something wrong" Made patient HYPOTENSIVE  . Percocet [Oxycodone-Acetaminophen] Nausea And Vomiting and Other (See Comments)    Stomach pains  . Percodan [Oxycodone-Aspirin] Nausea And Vomiting and Other (See Comments)    Stomach pains  . Tramadol Nausea Only  . Verapamil Other (See Comments)    " MD told me this was screwing me up" MAKES PATIENT HYPOTENSIVE  . Oxycodone Hcl     VITALS:  Blood pressure 118/66, pulse 63, temperature 98.1 F (36.7 C), temperature source Oral, resp. rate  18, height 5\' 7"  (1.702 m), weight 79.8 kg, SpO2 97 %.  PHYSICAL EXAMINATION:  GENERAL:  73 y.o.-year-old patient lying in the bed with no acute distress.  EYES: Pupils equal, round, reactive to light and accommodation. No scleral icterus. Extraocular muscles intact.  HEENT: Head atraumatic, normocephalic. Oropharynx and nasopharynx clear.  NECK:  Supple, no jugular venous distention. No thyroid enlargement, no tenderness.  LUNGS: Normal breath sounds bilaterally, no wheezing, rales,rhonchi or crepitation. No use of accessory muscles of respiration.  CARDIOVASCULAR: S1, S2 normal. No murmurs, rubs, or gallops.  ABDOMEN: Soft, nontender, nondistended. Bowel sounds present.  EXTREMITIES: No pedal edema, cyanosis, or clubbing.  NEUROLOGIC: Cranial nerves II through XII are intact.  Chronic left-sided weakness 4 out of 5 sensation intact. Gait not checked.  PSYCHIATRIC: The patient is alert and oriented x 3.  SKIN: No obvious rash, lesion, or ulcer.    LABORATORY PANEL:   CBC Recent Labs  Lab 10/15/18 1118  WBC 8.6  HGB 13.3  HCT 40.7  PLT 299   ------------------------------------------------------------------------------------------------------------------  Chemistries  Recent Labs  Lab 10/15/18 1118  10/16/18 0601 10/17/18 0506  NA 142  --   --  139  K 2.8*   < > 3.9 3.8  CL 105  --   --  108  CO2 24  --   --  26  GLUCOSE 107*  --   --  126*  BUN 6*  --   --  12  CREATININE 0.68  --   --  0.69  CALCIUM 8.9  --   --  8.4*  MG 1.9  --  1.9  --   AST 24  --   --   --   ALT 20  --   --   --   ALKPHOS 120  --   --   --   BILITOT 0.7  --   --   --    < > = values in this interval not displayed.   ------------------------------------------------------------------------------------------------------------------  Cardiac Enzymes No results for input(s): TROPONINI in the last 168  hours. ------------------------------------------------------------------------------------------------------------------  RADIOLOGY:  Mr Angio Head Wo Contrast  Result Date: 10/16/2018 CLINICAL DATA:  Left-sided weakness EXAM: MR HEAD WITHOUT CONTRAST MR CIRCLE OF WILLIS WITHOUT CONTRAST MRA OF THE NECK WITHOUT AND WITH CONTRAST TECHNIQUE: Multiplanar, multiecho pulse sequences of the brain, circle of willis and surrounding structures were obtained without intravenous contrast. Angiographic images of the neck were obtained using MRA technique without and with intravenous contrast. CONTRAST:  40mL GADAVIST GADOBUTROL 1 MMOL/ML IV SOLN COMPARISON:  CTA neck 09/22/2017 FINDINGS: Examination is generally degraded by motion. MRI HEAD FINDINGS BRAIN: There is an old left cerebellar infarct. There is posterior left MCA distribution encephalomalacia. There is no acute infarct, acute hemorrhage or mass. Diffuse confluent hyperintense T2-weighted signal within the periventricular, deep and juxtacortical white matter, most commonly due to chronic ischemic microangiopathy. Old posterior left MCA and left PCA territory infarcts with associated encephalomalacia. Generalized mild volume loss otherwise. Blood-sensitive sequences show no chronic microhemorrhage or superficial siderosis. Limited assessment of previously demonstrated left para clinoid mass without IV contrast. SKULL AND UPPER CERVICAL SPINE: The visualized skull base, calvarium, upper cervical spine and extracranial soft tissues are normal. SINUSES/ORBITS: No fluid levels or advanced mucosal thickening. No mastoid or middle ear effusion. The orbits are normal. MRA HEAD FINDINGS POSTERIOR CIRCULATION: --Basilar artery: Normal. --Posterior cerebral arteries: Fetal origins of both --Superior cerebellar arteries: Normal. --Inferior cerebellar arteries: Normal anterior and posterior inferior cerebellar arteries. ANTERIOR CIRCULATION: --Intracranial internal carotid  arteries: Normal. --Anterior cerebral arteries: Normal. Both A1 segments are present. Patent anterior communicating artery. --Middle cerebral arteries: Normal. --Posterior communicating arteries: Present bilaterally MRA NECK FINDINGS Aortic arch: Normal 3 vessel aortic branching pattern. The visualized subclavian arteries are normal. Right carotid system: Normal course and caliber without stenosis or evidence of dissection. Left carotid system: Normal course and caliber without stenosis or evidence of dissection. Vertebral arteries: Right. Vertebral artery origins are poorly visualized. Vertebral arteries are normal in course and caliber to the vertebrobasilar confluence without stenosis or evidence of dissection. IMPRESSION: 1. No acute intracranial process. 2. Old left MCA and left PCA territory infarcts and severe chronic small vessel disease. 3. No emergent large vessel occlusion or high-grade stenosis. 4. Limited examination due to motion artifact. No hemodynamically significant stenosis of the carotid or vertebral arteries in the neck. 5. Limited assessment of previously demonstrated left paraclinoid mass without IV contrast material. Electronically Signed   By: Ulyses Jarred M.D.   On: 10/16/2018 01:36   Mr Angio Neck W Wo Contrast  Result Date: 10/16/2018 CLINICAL DATA:  Left-sided weakness EXAM: MR HEAD WITHOUT CONTRAST MR CIRCLE OF WILLIS WITHOUT CONTRAST MRA OF THE NECK WITHOUT AND WITH CONTRAST TECHNIQUE: Multiplanar, multiecho pulse sequences of the brain, circle of willis and surrounding structures were obtained without intravenous contrast. Angiographic images of the neck were obtained using MRA technique without and with intravenous contrast. CONTRAST:  62mL GADAVIST GADOBUTROL 1 MMOL/ML IV SOLN COMPARISON:  CTA neck 09/22/2017 FINDINGS: Examination is generally degraded by motion. MRI HEAD  FINDINGS BRAIN: There is an old left cerebellar infarct. There is posterior left MCA distribution  encephalomalacia. There is no acute infarct, acute hemorrhage or mass. Diffuse confluent hyperintense T2-weighted signal within the periventricular, deep and juxtacortical white matter, most commonly due to chronic ischemic microangiopathy. Old posterior left MCA and left PCA territory infarcts with associated encephalomalacia. Generalized mild volume loss otherwise. Blood-sensitive sequences show no chronic microhemorrhage or superficial siderosis. Limited assessment of previously demonstrated left para clinoid mass without IV contrast. SKULL AND UPPER CERVICAL SPINE: The visualized skull base, calvarium, upper cervical spine and extracranial soft tissues are normal. SINUSES/ORBITS: No fluid levels or advanced mucosal thickening. No mastoid or middle ear effusion. The orbits are normal. MRA HEAD FINDINGS POSTERIOR CIRCULATION: --Basilar artery: Normal. --Posterior cerebral arteries: Fetal origins of both --Superior cerebellar arteries: Normal. --Inferior cerebellar arteries: Normal anterior and posterior inferior cerebellar arteries. ANTERIOR CIRCULATION: --Intracranial internal carotid arteries: Normal. --Anterior cerebral arteries: Normal. Both A1 segments are present. Patent anterior communicating artery. --Middle cerebral arteries: Normal. --Posterior communicating arteries: Present bilaterally MRA NECK FINDINGS Aortic arch: Normal 3 vessel aortic branching pattern. The visualized subclavian arteries are normal. Right carotid system: Normal course and caliber without stenosis or evidence of dissection. Left carotid system: Normal course and caliber without stenosis or evidence of dissection. Vertebral arteries: Right. Vertebral artery origins are poorly visualized. Vertebral arteries are normal in course and caliber to the vertebrobasilar confluence without stenosis or evidence of dissection. IMPRESSION: 1. No acute intracranial process. 2. Old left MCA and left PCA territory infarcts and severe chronic small  vessel disease. 3. No emergent large vessel occlusion or high-grade stenosis. 4. Limited examination due to motion artifact. No hemodynamically significant stenosis of the carotid or vertebral arteries in the neck. 5. Limited assessment of previously demonstrated left paraclinoid mass without IV contrast material. Electronically Signed   By: Ulyses Jarred M.D.   On: 10/16/2018 01:36   [name]  Result Date: 10/16/2018 CLINICAL DATA:  Left-sided weakness EXAM: MR HEAD WITHOUT CONTRAST MR CIRCLE OF WILLIS WITHOUT CONTRAST MRA OF THE NECK WITHOUT AND WITH CONTRAST TECHNIQUE: Multiplanar, multiecho pulse sequences of the brain, circle of willis and surrounding structures were obtained without intravenous contrast. Angiographic images of the neck were obtained using MRA technique without and with intravenous contrast. CONTRAST:  84mL GADAVIST GADOBUTROL 1 MMOL/ML IV SOLN COMPARISON:  CTA neck 09/22/2017 FINDINGS: Examination is generally degraded by motion. MRI HEAD FINDINGS BRAIN: There is an old left cerebellar infarct. There is posterior left MCA distribution encephalomalacia. There is no acute infarct, acute hemorrhage or mass. Diffuse confluent hyperintense T2-weighted signal within the periventricular, deep and juxtacortical white matter, most commonly due to chronic ischemic microangiopathy. Old posterior left MCA and left PCA territory infarcts with associated encephalomalacia. Generalized mild volume loss otherwise. Blood-sensitive sequences show no chronic microhemorrhage or superficial siderosis. Limited assessment of previously demonstrated left para clinoid mass without IV contrast. SKULL AND UPPER CERVICAL SPINE: The visualized skull base, calvarium, upper cervical spine and extracranial soft tissues are normal. SINUSES/ORBITS: No fluid levels or advanced mucosal thickening. No mastoid or middle ear effusion. The orbits are normal. MRA HEAD FINDINGS POSTERIOR CIRCULATION: --Basilar artery:  Normal. --Posterior cerebral arteries: Fetal origins of both --Superior cerebellar arteries: Normal. --Inferior cerebellar arteries: Normal anterior and posterior inferior cerebellar arteries. ANTERIOR CIRCULATION: --Intracranial internal carotid arteries: Normal. --Anterior cerebral arteries: Normal. Both A1 segments are present. Patent anterior communicating artery. --Middle cerebral arteries: Normal. --Posterior communicating arteries: Present bilaterally MRA NECK FINDINGS  Aortic arch: Normal 3 vessel aortic branching pattern. The visualized subclavian arteries are normal. Right carotid system: Normal course and caliber without stenosis or evidence of dissection. Left carotid system: Normal course and caliber without stenosis or evidence of dissection. Vertebral arteries: Right. Vertebral artery origins are poorly visualized. Vertebral arteries are normal in course and caliber to the vertebrobasilar confluence without stenosis or evidence of dissection. IMPRESSION: 1. No acute intracranial process. 2. Old left MCA and left PCA territory infarcts and severe chronic small vessel disease. 3. No emergent large vessel occlusion or high-grade stenosis. 4. Limited examination due to motion artifact. No hemodynamically significant stenosis of the carotid or vertebral arteries in the neck. 5. Limited assessment of previously demonstrated left paraclinoid mass without IV contrast material. Electronically Signed   By: Ulyses Jarred M.D.   On: 10/16/2018 01:36   Dg Chest Port 1 View  Result Date: 10/16/2018 CLINICAL DATA:  Smoker.  Recent fall.  COPD. EXAM: PORTABLE CHEST 1 VIEW COMPARISON:  March 18, 2018 FINDINGS: The heart size is unchanged. Aortic calcifications are noted. There is no pneumothorax. No large pleural effusion. No acute osseous abnormality. IMPRESSION: No active disease. Electronically Signed   By: Constance Holster M.D.   On: 10/16/2018 10:34    EKG:   Orders placed or performed during the  hospital encounter of 10/15/18  . ED EKG  . ED EKG  . EKG 12-Lead  . EKG 12-Lead    ASSESSMENT AND PLAN:   1.Left-sided weakness-stroke ruled out Patient evaluated by neurology and not a TPA candidate. Negative MRA/MRI of the brain, stroke ruled out  Patient has chronic left-sided weakness from previous stroke, okay to discharge patient from neurology standpoint with aspirin 81 mg  Outpatient follow-up with neurology as recommended or in a month fasting lipid LDL at 84 Aspirin and Plavix her home medications will be continued Continue statin PT, OT evaluation-requires 24-hour supervision recommending skilled nursing facility  speech consultation-no needs identified Patient is a wheelchair, walker bound at home at her baseline  2. HypoThyroidism: Continue Synthroid  3. Diabetes:A1c- 6.4  Sliding scale provided during the hospital course  ADA diet Metformin  Diabetes nurse consult has seen the patient during the hospital stay  4. Depression: Continue Cymbalta  5. PAF: Patient in normal sinus rhythm Continue telemetry  6.Tobacco dependence: Patient is encouraged to quit smoking and willing to attempt to quit was assessed. Patient highly motivated.Counseling was provided for 4 minutes by the admitting physician  7. COPD without signs of exacerbation  8. Chronic diastolic heart failure: No signs of exacerbation Continue Lasix  Disposition skilled nursing facility.  Follow-up with social worker  All the records are reviewed and case discussed with Care Management/Social Workerr. Management plans discussed with the patient, family and they are in agreement.  CODE STATUS: dnr   TOTAL TIME TAKING CARE OF THIS PATIENT: 29 minutes.   POSSIBLE D/C IN 1  DAYS, DEPENDING ON CLINICAL CONDITION.  Note: This dictation was prepared with Dragon dictation along with smaller phrase technology. Any transcriptional errors that result from this process are  unintentional.   Nicholes Mango M.D on 10/17/2018 at 1:19 PM  Between 7am to 6pm - Pager - [phone] After 6pm go to [email or web address] - password [email or web address] [facility]  Office  [phone]  CC: Primary care physician; Tracie Harrier, MD

## 2018-10-18 DIAGNOSIS — J449 Chronic obstructive pulmonary disease, unspecified: Secondary | ICD-10-CM | POA: Diagnosis not present

## 2018-10-18 DIAGNOSIS — Z716 Tobacco abuse counseling: Secondary | ICD-10-CM | POA: Diagnosis not present

## 2018-10-18 DIAGNOSIS — I480 Paroxysmal atrial fibrillation: Secondary | ICD-10-CM | POA: Diagnosis not present

## 2018-10-18 DIAGNOSIS — R531 Weakness: Secondary | ICD-10-CM | POA: Diagnosis not present

## 2018-10-18 LAB — BASIC METABOLIC PANEL
Anion gap: 7 (ref 5–15)
BUN: 14 mg/dL (ref 8–23)
CO2: 27 mmol/L (ref 22–32)
Calcium: 8.7 mg/dL — ABNORMAL LOW (ref 8.9–10.3)
Chloride: 106 mmol/L (ref 98–111)
Creatinine, Ser: 0.65 mg/dL (ref 0.44–1.00)
GFR calc Af Amer: >60 mL/min (ref >60)
GFR calc non Af Amer: >60 mL/min (ref >60)
Glucose, Bld: 126 mg/dL — ABNORMAL HIGH (ref 70–99)
Potassium: 3.7 mmol/L (ref 3.5–5.1)
Sodium: 140 mmol/L (ref 135–145)

## 2018-10-18 LAB — ECHOCARDIOGRAM COMPLETE BUBBLE STUDY
Height: 67 in
Weight: 2814.4 oz

## 2018-10-18 LAB — GLUCOSE, CAPILLARY
Glucose-Capillary: 121 mg/dL — ABNORMAL HIGH (ref 70–99)
Glucose-Capillary: 112 mg/dL — ABNORMAL HIGH (ref 70–99)

## 2018-10-18 NOTE — TOC Transition Note (Signed)
Transition of Care [facility]) - CM/SW Discharge Note   Patient Details  Name: Samantha Aguirre MRN: [id number] Date of Birth: [birth year]  Transition of Care [facility]) CM/SW Contact:  Shelbie Hutching, RN Phone Number: 10/18/2018, 9:12 AM   Clinical Narrative:    Patient is medically cleared for discharge.  Patient is awake and alert and reports that she lives alone and has home health services with [facility].  Patient states that she does not want to to go to rehab and will not go to rehab.  Patient reports that she has been getting home health services and will continue with that through [facility].  MD is concerned about safety at home, PT recommending 24 hour supervision.   0951: RNCM spoke with daughter and updated her that patient does not want to go to [facility].  Daughter verbalizes understanding and knows that we cannot force the patient to go to rehab.  Patient is still adamant about going home.   Patient will discharge home with home health.  Patient given taxi voucher for transportation.    Final next level of care: Home w Home Health Services Barriers to Discharge: Barriers Resolved   Patient Goals and CMS Choice   CMS Medicare.gov Compare Post Acute Care list provided to:: Patient Choice offered to / list presented to : Patient  Discharge Placement                       Discharge Plan and Services                          HH Arranged: RN, PT, OT, Nurse's Aide, Social Work [facility] Agency: [facility] [place] Date [place] Agency Contacted: 10/18/18 Time [place]: [id number] Representative spoke with at [place]: [employer] ([place]) Interventions     Readmission Risk Interventions Readmission Risk Prevention Plan 10/16/2018 06/15/2018  Transportation Screening Complete Complete  PCP or Specialist Appt within 5-7 Days Complete -  PCP or Specialist Appt within 3-5 Days - Complete  Home Care Screening Complete -  Medication  Review (RN CM) Complete -  HRI or [place] - Complete  Social Work Consult for [place] Planning/Counseling - Not Complete  SW consult not completed comments - na  Palliative Care Screening - Not Applicable  Medication Review [employer]) - Complete  Some recent data might be hidden

## 2018-10-18 NOTE — Consult Note (Signed)
PHARMACY CONSULT NOTE - FOLLOW UP  Pharmacy Consult for Electrolyte Monitoring and Replacement   Recent Labs: Potassium (mmol/L)  Date Value  10/18/2018 3.7   Magnesium (mg/dL)  Date Value  10/16/2018 1.9   Calcium (mg/dL)  Date Value  10/18/2018 8.7 (L)   Albumin (g/dL)  Date Value  10/15/2018 4.0   Phosphorus (mg/dL)  Date Value  07/06/2017 3.8   Sodium (mmol/L)  Date Value  10/18/2018 140     Assessment: Pharmacy has been consulted to monitor/replenish electrolytes in this 73 yo female with hypokalemia being seen for possible stroke. Patient ordered furosemide 20mg  PO BID.   Goal of Therapy:  Electrolytes within normal limits  Plan:  No further replacement warranted. Will obtain follow up BMP with am labs.    Pharmacy will continue to monitor and adjust per consult.   Chinita Greenland PharmD Clinical Pharmacist 10/18/2018

## 2018-10-18 NOTE — Discharge Summary (Signed)
[facility] NAME: Samantha Aguirre    MR#:  [id number]  DATE OF BIRTH:  July 27, 1945  DATE OF ADMISSION:  10/15/2018 ADMITTING PHYSICIAN: Bettey Costa, MD  DATE OF DISCHARGE: 10/18/18 10/16/18 PRIMARY CARE PHYSICIAN: Tracie Harrier, MD    ADMISSION DIAGNOSIS:  Weakness [R53.1]  DISCHARGE DIAGNOSIS:  Left-sided weakness  SECONDARY DIAGNOSIS:   Past Medical History:  Diagnosis Date  . Acid reflux   . Arthritis    Bilateral knees, hands, feet  . Asthma   . Atrial fibrillation ([place])   . Cancer ([place])   . CHF (congestive heart failure) (H[facility])    Diastolic CHF  . COPD (chronic obstructive pulmonary disease) ([place])   . Coronary artery disease   . Diabetes mellitus without complication ([facility])   . Frequent headaches   . GI bleed   . HLD (hyperlipidemia)   . Hypertension   . Seizures ([place])   . Skin cancer 2015   Suspected basal cell carcinoma on nose, surgically removed  . Stroke ([place]) 04/2017  . Thyroid disease   . Tremors of nervous system     HOSPITAL COURSE:   HPI  Samantha Aguirre  is a 73 y.o. female with a known history of previous CVA with residual left-sided weakness, chronic diastolic heart failure and COPD who presents to the emergency room due to left-sided weakness. Patient walks with a walker at baseline due to her left-sided weakness.  Patient reports that this morning she had increased left-sided weakness especially left lower extremity weakness.  Patient has been evaluated by neurology.  Patient is close to baseline and risk for TPA according to her neurologist.  1.Left-sided weakness-stroke ruled out Patient evaluated by neurology and not a TPA candidate. Negative MRA/MRI of the brain, stroke ruled out  Patient has chronic left-sided weakness from previous stroke, okay to discharge patient from neurology standpoint with aspirin 81 mg  Outpatient follow-up with neurology as recommended or in a month  fasting lipid LDL at 84 Aspirin and Plavix her home medications will be continued Continue statin PT, OT evaluation-recommending skilled nursing facility patient is refusing to go to skilled nursing facility and she prefers going home with home health.  speech consultation-no needs identified Patient is a wheelchair, walker bound at home at her baseline  2. HypoThyroidism: Continue Synthroid  3.  Diabetes:A1c- 6.4  Sliding scale provided during the hospital course  ADA diet Metformin  Diabetes nurse consult has seen the patient during the hospital stay  4.  Depression: Continue Cymbalta  5.  PAF: Patient in normal sinus rhythm Continue telemetry  6. Tobacco dependence: Patient is encouraged to quit smoking and willing to attempt to quit was assessed. Patient highly motivated.Counseling was provided for 4 minutes by the admitting physician  7.  COPD without signs of exacerbation  8.  Chronic diastolic heart failure: No signs of exacerbation Continue Lasix  Disposition skilled nursing facility per PT recommendation.  Patient is refusing to go to skilled nursing facility.  PT is recommending 24-hour supervision but all her family members including daughter are working and cannot provide 24-hour supervision.  Patient cannot afford 24/7 caregivers.  She understands the consequences of staying home without any supervision, she is aware that she might sustain fall and can have broken bones, cardiac arrest etc. patient clearly admits that she is responsible for going home and she cannot take care of herself  Discharge home with home health  DISCHARGE CONDITIONS:  fair  CONSULTS OBTAINED:  Treatment Team:  Leotis Pain, MD   PROCEDURES  None   DRUG ALLERGIES:   Allergies  Allergen Reactions  . Ivp Dye [Iodinated Diagnostic Agents] Itching    Pt injected with IV contrast.  5 min after injection pt complained of itching behind ear and on her abd.  . Methotrexate Rash   . Morphine And Related Other (See Comments)    "stops my breathing" NEAR-RESPIRATORY ARREST  . Mushroom Extract Complex Anaphylaxis    Made patient "deathly sick"  . Atenolol Other (See Comments)    "MD took me off of it bc it was doing something wrong" Made patient HYPOTENSIVE  . Percocet [Oxycodone-Acetaminophen] Nausea And Vomiting and Other (See Comments)    Stomach pains  . Percodan [Oxycodone-Aspirin] Nausea And Vomiting and Other (See Comments)    Stomach pains  . Tramadol Nausea Only  . Verapamil Other (See Comments)    " MD told me this was screwing me up" MAKES PATIENT HYPOTENSIVE  . Oxycodone Hcl     DISCHARGE MEDICATIONS:   Allergies as of 10/18/2018      Reactions   Ivp Dye [iodinated Diagnostic Agents] Itching   Pt injected with IV contrast.  5 min after injection pt complained of itching behind ear and on her abd.   Methotrexate Rash   Morphine And Related Other (See Comments)   "stops my breathing" NEAR-RESPIRATORY ARREST   Mushroom Extract Complex Anaphylaxis   Made patient "deathly sick"   Atenolol Other (See Comments)   "MD took me off of it bc it was doing something wrong" Made patient HYPOTENSIVE   Percocet [oxycodone-acetaminophen] Nausea And Vomiting, Other (See Comments)   Stomach pains   Percodan [oxycodone-aspirin] Nausea And Vomiting, Other (See Comments)   Stomach pains   Tramadol Nausea Only   Verapamil Other (See Comments)   " MD told me this was screwing me up" MAKES PATIENT HYPOTENSIVE   Oxycodone Hcl       Medication List    STOP taking these medications   diclofenac sodium 1 % Gel Commonly known as: VOLTAREN   fluticasone 50 MCG/ACT nasal spray Commonly known as: FLONASE   lisinopril 10 MG tablet Commonly known as: ZESTRIL   sucralfate 1 g tablet Commonly known as: CARAFATE     TAKE these medications   albuterol 108 (90 Base) MCG/ACT inhaler Commonly known as: VENTOLIN HFA Inhale 2 puffs into the lungs every 6 (six)  hours as needed for wheezing or shortness of breath.   aspirin 81 MG chewable tablet Commonly known as: Aspirin Childrens Chew 1 tablet (81 mg total) by mouth daily.   atorvastatin 80 MG tablet Commonly known as: LIPITOR Take 1 tablet (80 mg total) by mouth daily at 6 PM.   azelastine 0.1 % nasal spray Commonly known as: ASTELIN Place 2 sprays into both nostrils 2 (two) times daily.   clopidogrel 75 MG tablet Commonly known as: PLAVIX Take 75 mg by mouth daily.   colestipol 1 g tablet Commonly known as: COLESTID Take 2 g by mouth 2 (two) times daily.   DULoxetine 60 MG capsule Commonly known as: CYMBALTA Take 1 capsule (60 mg total) by mouth at bedtime.   ferrous sulfate 325 (65 FE) MG EC tablet Take 1 tablet (325 mg total) by mouth 2 (two) times daily with a meal. What changed: when to take this   furosemide 20 MG tablet Commonly known as: LASIX Take 20 mg by mouth 2 (two) times daily.  gabapentin 800 MG tablet Commonly known as: NEURONTIN Take 1 tablet (800 mg total) by mouth 2 (two) times daily.   glipiZIDE-metformin 5-500 MG tablet Commonly known as: METAGLIP Take 1 tablet by mouth 2 (two) times daily before a meal. What changed:   how much to take  when to take this  additional instructions   levETIRAcetam 500 MG tablet Commonly known as: KEPPRA Take 1 tablet (500 mg total) by mouth 2 (two) times daily.   levothyroxine 112 MCG tablet Commonly known as: SYNTHROID Take 1 tablet (112 mcg total) by mouth daily before breakfast.   nitroGLYCERIN 0.4 MG SL tablet Commonly known as: NITROSTAT Place 1 tablet (0.4 mg total) under the tongue every 5 (five) minutes as needed for chest pain.   omeprazole 20 MG capsule Commonly known as: PRILOSEC Take 20 mg by mouth 2 (two) times daily.   potassium chloride 10 MEQ tablet Commonly known as: KLOR-CON Take 1 tablet (10 mEq total) by mouth daily.   topiramate 25 MG tablet Commonly known as: TOPAMAX Take 25  mg by mouth 2 (two) times daily.   umeclidinium bromide 62.5 MCG/INH Aepb Commonly known as: Incruse Ellipta Inhale 1 puff into the lungs daily.   vitamin B-12 500 MCG tablet Commonly known as: CYANOCOBALAMIN Take 500 mcg by mouth daily.        DISCHARGE INSTRUCTIONS:   Follow-up with primary care physician in 3 to 4 days Follow-up with outpatient primary neurology as recommended or in a month Skilled nursing facility  DIET:  Cardiac diet and Diabetic diet  DISCHARGE CONDITION:  Fair  ACTIVITY:  Activity as tolerated per PT  OXYGEN:  Home Oxygen: No.   Oxygen Delivery: room air  DISCHARGE LOCATION:  snf  If you experience worsening of your admission symptoms, develop shortness of breath, life threatening emergency, suicidal or homicidal thoughts you must seek medical attention immediately by calling 911 or calling your MD immediately  if symptoms less severe.  You Must read complete instructions/literature along with all the possible adverse reactions/side effects for all the Medicines you take and that have been prescribed to you. Take any new Medicines after you have completely understood and accpet all the possible adverse reactions/side effects.   Please note  You were cared for by a hospitalist during your hospital stay. If you have any questions about your discharge medications or the care you received while you were in the hospital after you are discharged, you can call the unit and asked to speak with the hospitalist on call if the hospitalist that took care of you is not available. Once you are discharged, your primary care physician will handle any further medical issues. Please note that NO REFILLS for any discharge medications will be authorized once you are discharged, as it is imperative that you return to your primary care physician (or establish a relationship with a primary care physician if you do not have one) for your aftercare needs so that they can  reassess your need for medications and monitor your lab values.     Today  Chief Complaint  Patient presents with  . Weakness   Patient is feeling fine.  Has chronic left-sided weakness from previous stroke otherwise no new complaints.  Okay to discharge patient from neurology standpoint .  Daughter cannot supervise the patient 24 hours Physical therapy is recommending skilled nursing facility but patient is refusing to go to SNF and she is pretty adamant to go home with home health, understands the  consequences of going home without 24/ 7 supervision  ROS:  CONSTITUTIONAL: Denies fevers, chills. Denies any fatigue, weakness.  EYES: Denies blurry vision, double vision, eye pain. EARS, NOSE, THROAT: Denies tinnitus, ear pain, hearing loss. RESPIRATORY: Denies cough, wheeze, shortness of breath.  CARDIOVASCULAR: Denies chest pain, palpitations, edema.  GASTROINTESTINAL: Denies nausea, vomiting, diarrhea, abdominal pain. Denies bright red blood per rectum. GENITOURINARY: Denies dysuria, hematuria. ENDOCRINE: Denies nocturia or thyroid problems. HEMATOLOGIC AND LYMPHATIC: Denies easy bruising or bleeding. SKIN: Denies rash or lesion. MUSCULOSKELETAL: Denies pain in neck, back, shoulder, knees, hips or arthritic symptoms.  NEUROLOGIC: Chronic left-sided weakness PSYCHIATRIC: Denies anxiety or depressive symptoms.   VITAL SIGNS:  Blood pressure 140/63, pulse (!) 46, temperature (!) 97.5 F (36.4 C), temperature source Oral, resp. rate 18, height 5\' 7"  (1.702 m), weight 79.8 kg, SpO2 100 %.  I/O:    Intake/Output Summary (Last 24 hours) at 10/18/2018 1042 Last data filed at 10/18/2018 0900 Gross per 24 hour  Intake 480 ml  Output -  Net 480 ml    PHYSICAL EXAMINATION:  GENERAL:  73 y.o.-year-old patient lying in the bed with no acute distress.  EYES: Pupils equal, round, reactive to light and accommodation. No scleral icterus. Extraocular muscles intact.  HEENT: Head atraumatic,  normocephalic. Oropharynx and nasopharynx clear.  NECK:  Supple, no jugular venous distention. No thyroid enlargement, no tenderness.  LUNGS: Normal breath sounds bilaterally, no wheezing, rales,rhonchi or crepitation. No use of accessory muscles of respiration.  CARDIOVASCULAR: S1, S2 normal. No murmurs, rubs, or gallops.  ABDOMEN: Soft, non-tender, non-distended. Bowel sounds present.  EXTREMITIES: No pedal edema, cyanosis, or clubbing.  NEUROLOGIC: Cranial nerves II through XII are intact. Muscle strength 5/5 in all right-sided extremities, 3-4 out of 5 on left extremities. Sensation intact. Gait not checked.  PSYCHIATRIC: The patient is alert and oriented x 3.  SKIN: No obvious rash, lesion, or ulcer.   DATA REVIEW:   CBC Recent Labs  Lab 10/15/18 1118  WBC 8.6  HGB 13.3  HCT 40.7  PLT 299    Chemistries  Recent Labs  Lab 10/15/18 1118  10/16/18 0601  10/18/18 0454  NA 142  --   --    < > 140  K 2.8*   < > 3.9   < > 3.7  CL 105  --   --    < > 106  CO2 24  --   --    < > 27  GLUCOSE 107*  --   --    < > 126*  BUN 6*  --   --    < > 14  CREATININE 0.68  --   --    < > 0.65  CALCIUM 8.9  --   --    < > 8.7*  MG 1.9  --  1.9  --   --   AST 24  --   --   --   --   ALT 20  --   --   --   --   ALKPHOS 120  --   --   --   --   BILITOT 0.7  --   --   --   --    < > = values in this interval not displayed.    Cardiac Enzymes No results for input(s): TROPONINI in the last 168 hours.  Microbiology Results  Results for orders placed or performed during the hospital encounter of 10/15/18  SARS CORONAVIRUS 2 (TAT  6-24 HRS) Nasopharyngeal Nasopharyngeal Swab     Status: None   Collection Time: 10/15/18 11:18 AM   Specimen: Nasopharyngeal Swab  Result Value Ref Range Status   SARS Coronavirus 2 NEGATIVE NEGATIVE Final    Comment: (NOTE) SARS-CoV-2 target nucleic acids are NOT DETECTED. The SARS-CoV-2 RNA is generally detectable in upper and lower respiratory specimens  during the acute phase of infection. Negative results do not preclude SARS-CoV-2 infection, do not rule out co-infections with other pathogens, and should not be used as the sole basis for treatment or other patient management decisions. Negative results must be combined with clinical observations, patient history, and epidemiological information. The expected result is Negative. Fact Sheet for Patients: SugarRoll.be Fact Sheet for Healthcare Providers: https://www.woods-mathews.com/ This test is not yet approved or cleared by the [place] FDA and  has been authorized for detection and/or diagnosis of SARS-CoV-2 by FDA under an Emergency Use Authorization (EUA). This EUA will remain  in effect (meaning this test can be used) for the duration of the COVID-19 declaration under Section 56 4(b)(1) of the Act, 21 U.S.C. section 360bbb-3(b)(1), unless the authorization is terminated or revoked sooner. Performed at [facility], [place] [address], [place], [place] [address]     RADIOLOGY:  Mr Angio Head Wo Contrast  Result Date: 10/16/2018 CLINICAL DATA:  Left-sided weakness EXAM: MR HEAD WITHOUT CONTRAST MR CIRCLE OF WILLIS WITHOUT CONTRAST MRA OF THE NECK WITHOUT AND WITH CONTRAST TECHNIQUE: Multiplanar, multiecho pulse sequences of the brain, circle of willis and surrounding structures were obtained without intravenous contrast. Angiographic images of the neck were obtained using MRA technique without and with intravenous contrast. CONTRAST:  39mL GADAVIST GADOBUTROL 1 MMOL/ML IV SOLN COMPARISON:  CTA neck 09/22/2017 FINDINGS: Examination is generally degraded by motion. MRI HEAD FINDINGS BRAIN: There is an old left cerebellar infarct. There is posterior left MCA distribution encephalomalacia. There is no acute infarct, acute hemorrhage or mass. Diffuse confluent hyperintense T2-weighted signal within the periventricular, deep and juxtacortical  white matter, most commonly due to chronic ischemic microangiopathy. Old posterior left MCA and left PCA territory infarcts with associated encephalomalacia. Generalized mild volume loss otherwise. Blood-sensitive sequences show no chronic microhemorrhage or superficial siderosis. Limited assessment of previously demonstrated left para clinoid mass without IV contrast. SKULL AND UPPER CERVICAL SPINE: The visualized skull base, calvarium, upper cervical spine and extracranial soft tissues are normal. SINUSES/ORBITS: No fluid levels or advanced mucosal thickening. No mastoid or middle ear effusion. The orbits are normal. MRA HEAD FINDINGS POSTERIOR CIRCULATION: --Basilar artery: Normal. --Posterior cerebral arteries: Fetal origins of both --Superior cerebellar arteries: Normal. --Inferior cerebellar arteries: Normal anterior and posterior inferior cerebellar arteries. ANTERIOR CIRCULATION: --Intracranial internal carotid arteries: Normal. --Anterior cerebral arteries: Normal. Both A1 segments are present. Patent anterior communicating artery. --Middle cerebral arteries: Normal. --Posterior communicating arteries: Present bilaterally MRA NECK FINDINGS Aortic arch: Normal 3 vessel aortic branching pattern. The visualized subclavian arteries are normal. Right carotid system: Normal course and caliber without stenosis or evidence of dissection. Left carotid system: Normal course and caliber without stenosis or evidence of dissection. Vertebral arteries: Right. Vertebral artery origins are poorly visualized. Vertebral arteries are normal in course and caliber to the vertebrobasilar confluence without stenosis or evidence of dissection. IMPRESSION: 1. No acute intracranial process. 2. Old left MCA and left PCA territory infarcts and severe chronic small vessel disease. 3. No emergent large vessel occlusion or high-grade stenosis. 4. Limited examination due to motion artifact. No hemodynamically significant stenosis of the  carotid or vertebral arteries in the neck. 5. Limited assessment of previously demonstrated left paraclinoid mass without IV contrast material. Electronically Signed   By: Ulyses Jarred M.D.   On: 10/16/2018 01:36   Mr Angio Neck W Wo Contrast  Result Date: 10/16/2018 CLINICAL DATA:  Left-sided weakness EXAM: MR HEAD WITHOUT CONTRAST MR CIRCLE OF WILLIS WITHOUT CONTRAST MRA OF THE NECK WITHOUT AND WITH CONTRAST TECHNIQUE: Multiplanar, multiecho pulse sequences of the brain, circle of willis and surrounding structures were obtained without intravenous contrast. Angiographic images of the neck were obtained using MRA technique without and with intravenous contrast. CONTRAST:  73mL GADAVIST GADOBUTROL 1 MMOL/ML IV SOLN COMPARISON:  CTA neck 09/22/2017 FINDINGS: Examination is generally degraded by motion. MRI HEAD FINDINGS BRAIN: There is an old left cerebellar infarct. There is posterior left MCA distribution encephalomalacia. There is no acute infarct, acute hemorrhage or mass. Diffuse confluent hyperintense T2-weighted signal within the periventricular, deep and juxtacortical white matter, most commonly due to chronic ischemic microangiopathy. Old posterior left MCA and left PCA territory infarcts with associated encephalomalacia. Generalized mild volume loss otherwise. Blood-sensitive sequences show no chronic microhemorrhage or superficial siderosis. Limited assessment of previously demonstrated left para clinoid mass without IV contrast. SKULL AND UPPER CERVICAL SPINE: The visualized skull base, calvarium, upper cervical spine and extracranial soft tissues are normal. SINUSES/ORBITS: No fluid levels or advanced mucosal thickening. No mastoid or middle ear effusion. The orbits are normal. MRA HEAD FINDINGS POSTERIOR CIRCULATION: --Basilar artery: Normal. --Posterior cerebral arteries: Fetal origins of both --Superior cerebellar arteries: Normal. --Inferior cerebellar arteries: Normal anterior and posterior  inferior cerebellar arteries. ANTERIOR CIRCULATION: --Intracranial internal carotid arteries: Normal. --Anterior cerebral arteries: Normal. Both A1 segments are present. Patent anterior communicating artery. --Middle cerebral arteries: Normal. --Posterior communicating arteries: Present bilaterally MRA NECK FINDINGS Aortic arch: Normal 3 vessel aortic branching pattern. The visualized subclavian arteries are normal. Right carotid system: Normal course and caliber without stenosis or evidence of dissection. Left carotid system: Normal course and caliber without stenosis or evidence of dissection. Vertebral arteries: Right. Vertebral artery origins are poorly visualized. Vertebral arteries are normal in course and caliber to the vertebrobasilar confluence without stenosis or evidence of dissection. IMPRESSION: 1. No acute intracranial process. 2. Old left MCA and left PCA territory infarcts and severe chronic small vessel disease. 3. No emergent large vessel occlusion or high-grade stenosis. 4. Limited examination due to motion artifact. No hemodynamically significant stenosis of the carotid or vertebral arteries in the neck. 5. Limited assessment of previously demonstrated left paraclinoid mass without IV contrast material. Electronically Signed   By: Ulyses Jarred M.D.   On: 10/16/2018 01:36   [name]  Result Date: 10/16/2018 CLINICAL DATA:  Left-sided weakness EXAM: MR HEAD WITHOUT CONTRAST MR CIRCLE OF WILLIS WITHOUT CONTRAST MRA OF THE NECK WITHOUT AND WITH CONTRAST TECHNIQUE: Multiplanar, multiecho pulse sequences of the brain, circle of willis and surrounding structures were obtained without intravenous contrast. Angiographic images of the neck were obtained using MRA technique without and with intravenous contrast. CONTRAST:  18mL GADAVIST GADOBUTROL 1 MMOL/ML IV SOLN COMPARISON:  CTA neck 09/22/2017 FINDINGS: Examination is generally degraded by motion. MRI HEAD FINDINGS BRAIN: There is an old  left cerebellar infarct. There is posterior left MCA distribution encephalomalacia. There is no acute infarct, acute hemorrhage or mass. Diffuse confluent hyperintense T2-weighted signal within the periventricular, deep and juxtacortical white matter, most commonly due to chronic ischemic microangiopathy. Old posterior left MCA and left PCA territory infarcts with  associated encephalomalacia. Generalized mild volume loss otherwise. Blood-sensitive sequences show no chronic microhemorrhage or superficial siderosis. Limited assessment of previously demonstrated left para clinoid mass without IV contrast. SKULL AND UPPER CERVICAL SPINE: The visualized skull base, calvarium, upper cervical spine and extracranial soft tissues are normal. SINUSES/ORBITS: No fluid levels or advanced mucosal thickening. No mastoid or middle ear effusion. The orbits are normal. MRA HEAD FINDINGS POSTERIOR CIRCULATION: --Basilar artery: Normal. --Posterior cerebral arteries: Fetal origins of both --Superior cerebellar arteries: Normal. --Inferior cerebellar arteries: Normal anterior and posterior inferior cerebellar arteries. ANTERIOR CIRCULATION: --Intracranial internal carotid arteries: Normal. --Anterior cerebral arteries: Normal. Both A1 segments are present. Patent anterior communicating artery. --Middle cerebral arteries: Normal. --Posterior communicating arteries: Present bilaterally MRA NECK FINDINGS Aortic arch: Normal 3 vessel aortic branching pattern. The visualized subclavian arteries are normal. Right carotid system: Normal course and caliber without stenosis or evidence of dissection. Left carotid system: Normal course and caliber without stenosis or evidence of dissection. Vertebral arteries: Right. Vertebral artery origins are poorly visualized. Vertebral arteries are normal in course and caliber to the vertebrobasilar confluence without stenosis or evidence of dissection. IMPRESSION: 1. No acute intracranial process. 2. Old  left MCA and left PCA territory infarcts and severe chronic small vessel disease. 3. No emergent large vessel occlusion or high-grade stenosis. 4. Limited examination due to motion artifact. No hemodynamically significant stenosis of the carotid or vertebral arteries in the neck. 5. Limited assessment of previously demonstrated left paraclinoid mass without IV contrast material. Electronically Signed   By: Ulyses Jarred M.D.   On: 10/16/2018 01:36   Dg Chest Port 1 View  Result Date: 10/16/2018 CLINICAL DATA:  Smoker.  Recent fall.  COPD. EXAM: PORTABLE CHEST 1 VIEW COMPARISON:  March 18, 2018 FINDINGS: The heart size is unchanged. Aortic calcifications are noted. There is no pneumothorax. No large pleural effusion. No acute osseous abnormality. IMPRESSION: No active disease. Electronically Signed   By: Constance Holster M.D.   On: 10/16/2018 10:34   Dg Hip Unilat W Or Wo Pelvis 2-3 Views Left  Result Date: 10/15/2018 CLINICAL DATA:  Left hip pain after fall 5 days ago. EXAM: DG HIP (WITH OR WITHOUT PELVIS) 2-3V LEFT COMPARISON:  Radiographs of March 17, 2018. FINDINGS: There is no evidence of hip fracture or dislocation. There is no evidence of arthropathy or other focal bone abnormality. IMPRESSION: Negative. Electronically Signed   By: Marijo Conception M.D.   On: 10/15/2018 12:19   Ct Head Code Stroke Wo Contrast  Result Date: 10/15/2018 CLINICAL DATA:  Code stroke. Facial paralysis. Possible stroke. Worsening left facial droop EXAM: CT HEAD WITHOUT CONTRAST TECHNIQUE: Contiguous axial images were obtained from the base of the skull through the vertex without intravenous contrast. COMPARISON:  CT and MRI head 06/13/2018 FINDINGS: Brain: Chronic infarct left posterior parietal lobe unchanged. Chronic microvascular ischemic change in the white matter unchanged. Mild atrophy. Negative for hydrocephalus. Chronic infarct in the right anterior corpus callosum unchanged. Chronic infarcts in the cerebellum  bilaterally unchanged. Negative for acute infarct, hemorrhage, mass.  No midline shift. Vascular: Normal arterial flow voids. Skull: Negative Sinuses/Orbits: Negative Other: None ASPECTS ([place] Stroke Program Early CT Score) - Ganglionic level infarction (caudate, lentiform nuclei, internal capsule, insula, M1-M3 cortex): 7 - Supraganglionic infarction (M4-M6 cortex): 3 Total score (0-10 with 10 being normal): 10 IMPRESSION: 1. No acute abnormality 2. Atrophy and extensive chronic ischemic changes, stable from the prior study. 3. ASPECTS is 10 4. These results were called by telephone  at the time of interpretation on 10/15/2018 at 11:38 am to provider [facility] , who verbally acknowledged these results. Electronically Signed   By: Franchot Gallo M.D.   On: 10/15/2018 11:39    EKG:   Orders placed or performed during the hospital encounter of 10/15/18  . ED EKG  . ED EKG  . EKG 12-Lead  . EKG 12-Lead      Management plans discussed with the patient, family and they are in agreement.  CODE STATUS:     Code Status Orders  (From admission, onward)         Start     Ordered   10/15/18 1350  Do not attempt resuscitation (DNR)  Continuous    Question Answer Comment  In the event of cardiac or respiratory ARREST Do not call a "code blue"   In the event of cardiac or respiratory ARREST Do not perform Intubation, CPR, defibrillation or ACLS   In the event of cardiac or respiratory ARREST Use medication by any route, position, wound care, and other measures to relive pain and suffering. May use oxygen, suction and manual treatment of airway obstruction as needed for comfort.   Comments nurse may pronounce.  Patient more awake and alert and able to make her own medical decisions.  Clearly wishes to be DNR.      10/15/18 1350        Code Status History    Date Active Date Inactive Code Status Order ID Comments User Context   10/15/2018 1350 10/15/2018 1350 Full Code [id number]  Bettey Costa,  MD Inpatient   06/13/2018 1650 06/15/2018 1430 DNR [id number]  Otila Back, MD ED   03/19/2018 0040 03/19/2018 2120 DNR [id number]  Sela Hua, MD Inpatient   03/13/2018 1042 03/17/2018 2043 DNR [id number]  Loletha Grayer, MD ED   10/20/2017 1704 10/21/2017 2006 DNR [id number]  Epifanio Lesches, MD ED   09/22/2017 2207 09/24/2017 1732 DNR [id number]  Rise Patience, MD ED   07/30/2017 1420 08/03/2017 1655 DNR [id number]  Karmen Bongo, MD ED   07/06/2017 1313 07/11/2017 2202 DNR [id number]  Fritzi Mandes, MD Inpatient   04/03/2017 2010 04/08/2017 2102 DNR [id number]  Demetrios Loll, MD Inpatient   03/27/2017 1519 04/02/2017 1517 Full Code [id number]  Saundra Shelling, MD Inpatient   02/18/2017 1623 02/21/2017 1919 Full Code [id number]  Salary, Avel Peace, MD Inpatient   10/03/2016 1841 10/04/2016 1739 Full Code [id number]  Demetrios Loll, MD Inpatient   02/27/2016 1744 02/28/2016 2034 Full Code [id number]  Vaughan Basta, MD Inpatient   02/19/2016 1553 02/21/2016 2041 Full Code [id number]  Gladstone Lighter, MD Inpatient   12/27/2015 1309 01/01/2016 1833 DNR [id number]  Loletha Grayer, MD ED   09/16/2015 1852 09/19/2015 1929 Full Code [id number]  Baxter Hire, MD Inpatient   08/16/2015 1714 08/19/2015 1735 DNR [id number]  Willia Craze, NP Inpatient   Advance Care Planning Activity      TOTAL TIME TAKING CARE OF THIS PATIENT: 45  minutes.   Note: This dictation was prepared with Dragon dictation along with smaller phrase technology. Any transcriptional errors that result from this process are unintentional.   @[facility] @  on 10/18/2018 at 10:42 AM  Between 7am to 6pm - Pager - [phone]  After 6pm go to [email or web address] - password [email or web address] [facility]  Office  [phone]  CC: Primary care physician; Tracie Harrier, MD

## 2018-10-18 NOTE — Progress Notes (Signed)
Occupational Therapy Treatment Patient Details Name: Samantha Aguirre MRN: [id number] DOB: Jan 31, 1945 Today's Date: 10/18/2018    History of present illness Pt is a 73 year old female with c/o SOB and L sided weakness.  PMH includes stroke, seizures, COPD.   OT comments  Pt seen for OT tx this date. Pt educated in cognitive behavioral pain coping strategies for pt's chronic headache and arthritis pain including distraction, pleasant imagery, relaxation, and meaningful occupational engagement; pt verbalized understanding. PT continues to benefit from skilled OT services. If pt does not go to rehab, will benefit from [facility] services.    Follow Up Recommendations  SNF    Equipment Recommendations       Recommendations for Other Services      Precautions / Restrictions Precautions Precautions: Fall Restrictions Weight Bearing Restrictions: No       Mobility Bed Mobility                  Transfers                      Balance                                           ADL either performed or assessed with clinical judgement   ADL Overall ADL's : Needs assistance/impaired                                             Vision       Perception     Praxis      Cognition Arousal/Alertness: Awake/alert Behavior During Therapy: WFL for tasks assessed/performed Overall Cognitive Status: Within Functional Limits for tasks assessed                                          Exercises Other Exercises Other Exercises: Pt educated in cognitive behavioral pain coping strategies for pt's chronic headache and arthritis pain including distraction, pleasant imagery, relaxation, and meaningful occupational engagement; pt verbalized understanding   Shoulder Instructions       General Comments      Pertinent Vitals/ Pain          Home Living                                          Prior  Functioning/Environment              Frequency  Min 1X/week        Progress Toward Goals  OT Goals(current goals can now be found in the care plan section)  Progress towards OT goals: Progressing toward goals  Acute Rehab OT Goals Patient Stated Goal: to get home and get more assistance OT Goal Formulation: With patient Time For Goal Achievement: 10/30/18 Potential to Achieve Goals: Good  Plan Discharge plan remains appropriate;Frequency remains appropriate    Co-evaluation                 AM-PAC OT "6 Clicks" Daily Activity     Outcome Measure   Help from another person  eating meals?: None Help from another person taking care of personal grooming?: None Help from another person toileting, which includes using toliet, bedpan, or urinal?: A Little Help from another person bathing (including washing, rinsing, drying)?: A Little Help from another person to put on and taking off regular upper body clothing?: None Help from another person to put on and taking off regular lower body clothing?: A Little 6 Click Score: 21    End of Session    OT Visit Diagnosis: Unsteadiness on feet (R26.81);Muscle weakness (generalized) (M62.81)   Activity Tolerance Patient tolerated treatment well   Patient Left in bed;with call bell/phone within reach;with bed alarm set   Nurse Communication          Time: 1041-1106 OT Time Calculation (min): 25 min  Charges: OT General Charges $OT Visit: 1 Visit OT Treatments $Therapeutic Activity: 23-37 mins  Jeni Salles, MPH, MS, OTR/L ascom [phone] 10/18/18, 12:22 PM

## 2018-10-19 DIAGNOSIS — I5032 Chronic diastolic (congestive) heart failure: Secondary | ICD-10-CM | POA: Diagnosis not present

## 2018-10-19 DIAGNOSIS — I110 Hypertensive heart disease with heart failure: Secondary | ICD-10-CM | POA: Diagnosis not present

## 2018-10-19 DIAGNOSIS — Z7984 Long term (current) use of oral hypoglycemic drugs: Secondary | ICD-10-CM | POA: Diagnosis not present

## 2018-10-19 DIAGNOSIS — I4891 Unspecified atrial fibrillation: Secondary | ICD-10-CM | POA: Diagnosis not present

## 2018-10-19 DIAGNOSIS — E119 Type 2 diabetes mellitus without complications: Secondary | ICD-10-CM | POA: Diagnosis not present

## 2018-10-19 DIAGNOSIS — G459 Transient cerebral ischemic attack, unspecified: Secondary | ICD-10-CM | POA: Diagnosis not present

## 2018-10-20 DIAGNOSIS — I110 Hypertensive heart disease with heart failure: Secondary | ICD-10-CM | POA: Diagnosis not present

## 2018-10-20 DIAGNOSIS — G459 Transient cerebral ischemic attack, unspecified: Secondary | ICD-10-CM | POA: Diagnosis not present

## 2018-10-20 DIAGNOSIS — I4891 Unspecified atrial fibrillation: Secondary | ICD-10-CM | POA: Diagnosis not present

## 2018-10-20 DIAGNOSIS — Z7984 Long term (current) use of oral hypoglycemic drugs: Secondary | ICD-10-CM | POA: Diagnosis not present

## 2018-10-20 DIAGNOSIS — E119 Type 2 diabetes mellitus without complications: Secondary | ICD-10-CM | POA: Diagnosis not present

## 2018-10-20 DIAGNOSIS — I5032 Chronic diastolic (congestive) heart failure: Secondary | ICD-10-CM | POA: Diagnosis not present

## 2018-10-22 DIAGNOSIS — Z7984 Long term (current) use of oral hypoglycemic drugs: Secondary | ICD-10-CM | POA: Diagnosis not present

## 2018-10-22 DIAGNOSIS — G459 Transient cerebral ischemic attack, unspecified: Secondary | ICD-10-CM | POA: Diagnosis not present

## 2018-10-22 DIAGNOSIS — I110 Hypertensive heart disease with heart failure: Secondary | ICD-10-CM | POA: Diagnosis not present

## 2018-10-22 DIAGNOSIS — I4891 Unspecified atrial fibrillation: Secondary | ICD-10-CM | POA: Diagnosis not present

## 2018-10-22 DIAGNOSIS — I5032 Chronic diastolic (congestive) heart failure: Secondary | ICD-10-CM | POA: Diagnosis not present

## 2018-10-22 DIAGNOSIS — E119 Type 2 diabetes mellitus without complications: Secondary | ICD-10-CM | POA: Diagnosis not present

## 2018-10-29 DIAGNOSIS — E119 Type 2 diabetes mellitus without complications: Secondary | ICD-10-CM | POA: Diagnosis not present

## 2018-10-29 DIAGNOSIS — I4891 Unspecified atrial fibrillation: Secondary | ICD-10-CM | POA: Diagnosis not present

## 2018-10-29 DIAGNOSIS — G459 Transient cerebral ischemic attack, unspecified: Secondary | ICD-10-CM | POA: Diagnosis not present

## 2018-10-29 DIAGNOSIS — I110 Hypertensive heart disease with heart failure: Secondary | ICD-10-CM | POA: Diagnosis not present

## 2018-10-29 DIAGNOSIS — Z7984 Long term (current) use of oral hypoglycemic drugs: Secondary | ICD-10-CM | POA: Diagnosis not present

## 2018-10-29 DIAGNOSIS — I5032 Chronic diastolic (congestive) heart failure: Secondary | ICD-10-CM | POA: Diagnosis not present

## 2018-11-02 DIAGNOSIS — I110 Hypertensive heart disease with heart failure: Secondary | ICD-10-CM | POA: Diagnosis not present

## 2018-11-02 DIAGNOSIS — G459 Transient cerebral ischemic attack, unspecified: Secondary | ICD-10-CM | POA: Diagnosis not present

## 2018-11-02 DIAGNOSIS — I4891 Unspecified atrial fibrillation: Secondary | ICD-10-CM | POA: Diagnosis not present

## 2018-11-02 DIAGNOSIS — E119 Type 2 diabetes mellitus without complications: Secondary | ICD-10-CM | POA: Diagnosis not present

## 2018-11-02 DIAGNOSIS — Z7984 Long term (current) use of oral hypoglycemic drugs: Secondary | ICD-10-CM | POA: Diagnosis not present

## 2018-11-02 DIAGNOSIS — I5032 Chronic diastolic (congestive) heart failure: Secondary | ICD-10-CM | POA: Diagnosis not present

## 2018-11-04 DIAGNOSIS — Z7984 Long term (current) use of oral hypoglycemic drugs: Secondary | ICD-10-CM | POA: Diagnosis not present

## 2018-11-04 DIAGNOSIS — I110 Hypertensive heart disease with heart failure: Secondary | ICD-10-CM | POA: Diagnosis not present

## 2018-11-04 DIAGNOSIS — I4891 Unspecified atrial fibrillation: Secondary | ICD-10-CM | POA: Diagnosis not present

## 2018-11-04 DIAGNOSIS — I5032 Chronic diastolic (congestive) heart failure: Secondary | ICD-10-CM | POA: Diagnosis not present

## 2018-11-04 DIAGNOSIS — G459 Transient cerebral ischemic attack, unspecified: Secondary | ICD-10-CM | POA: Diagnosis not present

## 2018-11-04 DIAGNOSIS — E119 Type 2 diabetes mellitus without complications: Secondary | ICD-10-CM | POA: Diagnosis not present

## 2018-11-05 DIAGNOSIS — I5032 Chronic diastolic (congestive) heart failure: Secondary | ICD-10-CM | POA: Diagnosis not present

## 2018-11-05 DIAGNOSIS — E119 Type 2 diabetes mellitus without complications: Secondary | ICD-10-CM | POA: Diagnosis not present

## 2018-11-05 DIAGNOSIS — I110 Hypertensive heart disease with heart failure: Secondary | ICD-10-CM | POA: Diagnosis not present

## 2018-11-05 DIAGNOSIS — Z7984 Long term (current) use of oral hypoglycemic drugs: Secondary | ICD-10-CM | POA: Diagnosis not present

## 2018-11-05 DIAGNOSIS — G459 Transient cerebral ischemic attack, unspecified: Secondary | ICD-10-CM | POA: Diagnosis not present

## 2018-11-05 DIAGNOSIS — I4891 Unspecified atrial fibrillation: Secondary | ICD-10-CM | POA: Diagnosis not present

## 2018-11-08 DIAGNOSIS — I4891 Unspecified atrial fibrillation: Secondary | ICD-10-CM | POA: Diagnosis not present

## 2018-11-08 DIAGNOSIS — I5032 Chronic diastolic (congestive) heart failure: Secondary | ICD-10-CM | POA: Diagnosis not present

## 2018-11-08 DIAGNOSIS — I110 Hypertensive heart disease with heart failure: Secondary | ICD-10-CM | POA: Diagnosis not present

## 2018-11-08 DIAGNOSIS — Z7984 Long term (current) use of oral hypoglycemic drugs: Secondary | ICD-10-CM | POA: Diagnosis not present

## 2018-11-08 DIAGNOSIS — E119 Type 2 diabetes mellitus without complications: Secondary | ICD-10-CM | POA: Diagnosis not present

## 2018-11-08 DIAGNOSIS — G459 Transient cerebral ischemic attack, unspecified: Secondary | ICD-10-CM | POA: Diagnosis not present

## 2018-11-10 DIAGNOSIS — E119 Type 2 diabetes mellitus without complications: Secondary | ICD-10-CM | POA: Diagnosis not present

## 2018-11-10 DIAGNOSIS — I4891 Unspecified atrial fibrillation: Secondary | ICD-10-CM | POA: Diagnosis not present

## 2018-11-10 DIAGNOSIS — I5032 Chronic diastolic (congestive) heart failure: Secondary | ICD-10-CM | POA: Diagnosis not present

## 2018-11-10 DIAGNOSIS — I110 Hypertensive heart disease with heart failure: Secondary | ICD-10-CM | POA: Diagnosis not present

## 2018-11-10 DIAGNOSIS — Z7984 Long term (current) use of oral hypoglycemic drugs: Secondary | ICD-10-CM | POA: Diagnosis not present

## 2018-11-10 DIAGNOSIS — G459 Transient cerebral ischemic attack, unspecified: Secondary | ICD-10-CM | POA: Diagnosis not present

## 2018-11-11 DIAGNOSIS — G459 Transient cerebral ischemic attack, unspecified: Secondary | ICD-10-CM | POA: Diagnosis not present

## 2018-11-11 DIAGNOSIS — E119 Type 2 diabetes mellitus without complications: Secondary | ICD-10-CM | POA: Diagnosis not present

## 2018-11-11 DIAGNOSIS — Z7984 Long term (current) use of oral hypoglycemic drugs: Secondary | ICD-10-CM | POA: Diagnosis not present

## 2018-11-11 DIAGNOSIS — I110 Hypertensive heart disease with heart failure: Secondary | ICD-10-CM | POA: Diagnosis not present

## 2018-11-11 DIAGNOSIS — I4891 Unspecified atrial fibrillation: Secondary | ICD-10-CM | POA: Diagnosis not present

## 2018-11-11 DIAGNOSIS — I5032 Chronic diastolic (congestive) heart failure: Secondary | ICD-10-CM | POA: Diagnosis not present

## 2018-11-13 DIAGNOSIS — Z7982 Long term (current) use of aspirin: Secondary | ICD-10-CM | POA: Diagnosis not present

## 2018-11-13 DIAGNOSIS — E119 Type 2 diabetes mellitus without complications: Secondary | ICD-10-CM | POA: Diagnosis not present

## 2018-11-13 DIAGNOSIS — G4089 Other seizures: Secondary | ICD-10-CM | POA: Diagnosis not present

## 2018-11-13 DIAGNOSIS — S32050D Wedge compression fracture of fifth lumbar vertebra, subsequent encounter for fracture with routine healing: Secondary | ICD-10-CM | POA: Diagnosis not present

## 2018-11-13 DIAGNOSIS — Z87891 Personal history of nicotine dependence: Secondary | ICD-10-CM | POA: Diagnosis not present

## 2018-11-13 DIAGNOSIS — F17210 Nicotine dependence, cigarettes, uncomplicated: Secondary | ICD-10-CM | POA: Diagnosis not present

## 2018-11-13 DIAGNOSIS — Z7984 Long term (current) use of oral hypoglycemic drugs: Secondary | ICD-10-CM | POA: Diagnosis not present

## 2018-11-13 DIAGNOSIS — J449 Chronic obstructive pulmonary disease, unspecified: Secondary | ICD-10-CM | POA: Diagnosis not present

## 2018-11-13 DIAGNOSIS — I4891 Unspecified atrial fibrillation: Secondary | ICD-10-CM | POA: Diagnosis not present

## 2018-11-13 DIAGNOSIS — I5032 Chronic diastolic (congestive) heart failure: Secondary | ICD-10-CM | POA: Diagnosis not present

## 2018-11-13 DIAGNOSIS — I69354 Hemiplegia and hemiparesis following cerebral infarction affecting left non-dominant side: Secondary | ICD-10-CM | POA: Diagnosis not present

## 2018-11-13 DIAGNOSIS — I110 Hypertensive heart disease with heart failure: Secondary | ICD-10-CM | POA: Diagnosis not present

## 2018-11-16 DIAGNOSIS — I4891 Unspecified atrial fibrillation: Secondary | ICD-10-CM | POA: Diagnosis not present

## 2018-11-16 DIAGNOSIS — E119 Type 2 diabetes mellitus without complications: Secondary | ICD-10-CM | POA: Diagnosis not present

## 2018-11-16 DIAGNOSIS — I110 Hypertensive heart disease with heart failure: Secondary | ICD-10-CM | POA: Diagnosis not present

## 2018-11-16 DIAGNOSIS — J449 Chronic obstructive pulmonary disease, unspecified: Secondary | ICD-10-CM | POA: Diagnosis not present

## 2018-11-16 DIAGNOSIS — I5032 Chronic diastolic (congestive) heart failure: Secondary | ICD-10-CM | POA: Diagnosis not present

## 2018-11-16 DIAGNOSIS — I69354 Hemiplegia and hemiparesis following cerebral infarction affecting left non-dominant side: Secondary | ICD-10-CM | POA: Diagnosis not present

## 2018-11-18 DIAGNOSIS — I4891 Unspecified atrial fibrillation: Secondary | ICD-10-CM | POA: Diagnosis not present

## 2018-11-18 DIAGNOSIS — I69354 Hemiplegia and hemiparesis following cerebral infarction affecting left non-dominant side: Secondary | ICD-10-CM | POA: Diagnosis not present

## 2018-11-18 DIAGNOSIS — I110 Hypertensive heart disease with heart failure: Secondary | ICD-10-CM | POA: Diagnosis not present

## 2018-11-18 DIAGNOSIS — I5032 Chronic diastolic (congestive) heart failure: Secondary | ICD-10-CM | POA: Diagnosis not present

## 2018-11-18 DIAGNOSIS — E119 Type 2 diabetes mellitus without complications: Secondary | ICD-10-CM | POA: Diagnosis not present

## 2018-11-18 DIAGNOSIS — J449 Chronic obstructive pulmonary disease, unspecified: Secondary | ICD-10-CM | POA: Diagnosis not present

## 2018-11-19 DIAGNOSIS — I69354 Hemiplegia and hemiparesis following cerebral infarction affecting left non-dominant side: Secondary | ICD-10-CM | POA: Diagnosis not present

## 2018-11-19 DIAGNOSIS — I5032 Chronic diastolic (congestive) heart failure: Secondary | ICD-10-CM | POA: Diagnosis not present

## 2018-11-19 DIAGNOSIS — I4891 Unspecified atrial fibrillation: Secondary | ICD-10-CM | POA: Diagnosis not present

## 2018-11-19 DIAGNOSIS — J449 Chronic obstructive pulmonary disease, unspecified: Secondary | ICD-10-CM | POA: Diagnosis not present

## 2018-11-19 DIAGNOSIS — E119 Type 2 diabetes mellitus without complications: Secondary | ICD-10-CM | POA: Diagnosis not present

## 2018-11-19 DIAGNOSIS — I110 Hypertensive heart disease with heart failure: Secondary | ICD-10-CM | POA: Diagnosis not present

## 2018-11-22 DIAGNOSIS — I4891 Unspecified atrial fibrillation: Secondary | ICD-10-CM | POA: Diagnosis not present

## 2018-11-22 DIAGNOSIS — I5032 Chronic diastolic (congestive) heart failure: Secondary | ICD-10-CM | POA: Diagnosis not present

## 2018-11-22 DIAGNOSIS — J449 Chronic obstructive pulmonary disease, unspecified: Secondary | ICD-10-CM | POA: Diagnosis not present

## 2018-11-22 DIAGNOSIS — I110 Hypertensive heart disease with heart failure: Secondary | ICD-10-CM | POA: Diagnosis not present

## 2018-11-22 DIAGNOSIS — I69354 Hemiplegia and hemiparesis following cerebral infarction affecting left non-dominant side: Secondary | ICD-10-CM | POA: Diagnosis not present

## 2018-11-22 DIAGNOSIS — E119 Type 2 diabetes mellitus without complications: Secondary | ICD-10-CM | POA: Diagnosis not present

## 2018-11-23 DIAGNOSIS — I4891 Unspecified atrial fibrillation: Secondary | ICD-10-CM | POA: Diagnosis not present

## 2018-11-23 DIAGNOSIS — I110 Hypertensive heart disease with heart failure: Secondary | ICD-10-CM | POA: Diagnosis not present

## 2018-11-23 DIAGNOSIS — I5032 Chronic diastolic (congestive) heart failure: Secondary | ICD-10-CM | POA: Diagnosis not present

## 2018-11-23 DIAGNOSIS — E119 Type 2 diabetes mellitus without complications: Secondary | ICD-10-CM | POA: Diagnosis not present

## 2018-11-23 DIAGNOSIS — J449 Chronic obstructive pulmonary disease, unspecified: Secondary | ICD-10-CM | POA: Diagnosis not present

## 2018-11-23 DIAGNOSIS — I69354 Hemiplegia and hemiparesis following cerebral infarction affecting left non-dominant side: Secondary | ICD-10-CM | POA: Diagnosis not present

## 2018-11-26 DIAGNOSIS — I110 Hypertensive heart disease with heart failure: Secondary | ICD-10-CM | POA: Diagnosis not present

## 2018-11-26 DIAGNOSIS — I5032 Chronic diastolic (congestive) heart failure: Secondary | ICD-10-CM | POA: Diagnosis not present

## 2018-11-26 DIAGNOSIS — I69354 Hemiplegia and hemiparesis following cerebral infarction affecting left non-dominant side: Secondary | ICD-10-CM | POA: Diagnosis not present

## 2018-11-26 DIAGNOSIS — J449 Chronic obstructive pulmonary disease, unspecified: Secondary | ICD-10-CM | POA: Diagnosis not present

## 2018-11-26 DIAGNOSIS — I4891 Unspecified atrial fibrillation: Secondary | ICD-10-CM | POA: Diagnosis not present

## 2018-11-26 DIAGNOSIS — E119 Type 2 diabetes mellitus without complications: Secondary | ICD-10-CM | POA: Diagnosis not present

## 2018-11-27 ENCOUNTER — Other Ambulatory Visit: Payer: Self-pay

## 2018-11-27 ENCOUNTER — Emergency Department: Payer: Medicare Other

## 2018-11-27 ENCOUNTER — Emergency Department
Admission: EM | Admit: 2018-11-27 | Discharge: 2018-11-27 | Disposition: A | Discharge status: Home / Self Care | Payer: Medicare Other | Attending: Emergency Medicine | Admitting: Emergency Medicine

## 2018-11-27 ENCOUNTER — Encounter: Payer: Self-pay | Admitting: Emergency Medicine

## 2018-11-27 DIAGNOSIS — I2510 Atherosclerotic heart disease of native coronary artery without angina pectoris: Secondary | ICD-10-CM | POA: Insufficient documentation

## 2018-11-27 DIAGNOSIS — I110 Hypertensive heart disease with heart failure: Secondary | ICD-10-CM | POA: Diagnosis not present

## 2018-11-27 DIAGNOSIS — Z8673 Personal history of transient ischemic attack (TIA), and cerebral infarction without residual deficits: Secondary | ICD-10-CM | POA: Diagnosis not present

## 2018-11-27 DIAGNOSIS — E119 Type 2 diabetes mellitus without complications: Secondary | ICD-10-CM | POA: Diagnosis not present

## 2018-11-27 DIAGNOSIS — Y929 Unspecified place or not applicable: Secondary | ICD-10-CM | POA: Insufficient documentation

## 2018-11-27 DIAGNOSIS — M7989 Other specified soft tissue disorders: Secondary | ICD-10-CM | POA: Diagnosis not present

## 2018-11-27 DIAGNOSIS — S60212A Contusion of left wrist, initial encounter: Secondary | ICD-10-CM | POA: Insufficient documentation

## 2018-11-27 DIAGNOSIS — Y999 Unspecified external cause status: Secondary | ICD-10-CM | POA: Insufficient documentation

## 2018-11-27 DIAGNOSIS — W231XXA Caught, crushed, jammed, or pinched between stationary objects, initial encounter: Secondary | ICD-10-CM | POA: Insufficient documentation

## 2018-11-27 DIAGNOSIS — J45909 Unspecified asthma, uncomplicated: Secondary | ICD-10-CM | POA: Diagnosis not present

## 2018-11-27 DIAGNOSIS — Z85828 Personal history of other malignant neoplasm of skin: Secondary | ICD-10-CM | POA: Diagnosis not present

## 2018-11-27 DIAGNOSIS — I5030 Unspecified diastolic (congestive) heart failure: Secondary | ICD-10-CM | POA: Diagnosis not present

## 2018-11-27 DIAGNOSIS — Z79899 Other long term (current) drug therapy: Secondary | ICD-10-CM | POA: Insufficient documentation

## 2018-11-27 DIAGNOSIS — J449 Chronic obstructive pulmonary disease, unspecified: Secondary | ICD-10-CM | POA: Diagnosis not present

## 2018-11-27 DIAGNOSIS — S6992XA Unspecified injury of left wrist, hand and finger(s), initial encounter: Secondary | ICD-10-CM | POA: Diagnosis not present

## 2018-11-27 DIAGNOSIS — Y939 Activity, unspecified: Secondary | ICD-10-CM | POA: Insufficient documentation

## 2018-11-27 NOTE — Discharge Instructions (Addendum)
Follow-up with your regular doctor or orthopedics if not better in 5 to 7 days.  Apply ice to the wrist.  Wear the splint for comfort.  Return emergency department if worsening.

## 2018-11-27 NOTE — ED Provider Notes (Signed)
[facility] Emergency Department Provider Note  ____________________________________________   First MD Initiated Contact with Patient 11/27/18 1159     (approximate)  I have reviewed the triage vital signs and the nursing notes.   HISTORY  Chief Complaint Wrist Pain    HPI Virgin Greisen is a 73 y.o. female presents emergency department complaint of left wrist pain.  States she was in her wheelchair holding the screen door open and the door came back landing on the wrist.  Pain with movement.  No other injuries reported.    Past Medical History:  Diagnosis Date  . Acid reflux   . Arthritis    Bilateral knees, hands, feet  . Asthma   . Atrial fibrillation ([place])   . Cancer ([place])   . CHF (congestive heart failure) (HCC)    Diastolic CHF  . COPD (chronic obstructive pulmonary disease) ([place])   . Coronary artery disease   . Diabetes mellitus without complication ([place])   . Frequent headaches   . GI bleed   . HLD (hyperlipidemia)   . Hypertension   . Seizures ([place])   . Skin cancer 2015   Suspected basal cell carcinoma on nose, surgically removed  . Stroke ([place]) 04/2017  . Thyroid disease   . Tremors of nervous system     Patient Active Problem List   Diagnosis Date Noted  . Stroke ([place]) 06/13/2018  . Hypotension 03/18/2018  . Malnutrition of moderate degree 03/16/2018  . Closed fracture of lumbar vertebral body ([place]) 03/15/2018  . Speaking difficulty 03/13/2018  . TIA (transient ischemic attack) 09/22/2017  . PAF (paroxysmal atrial fibrillation) ([place])   . Acute CVA (cerebrovascular accident) ([place]) 07/30/2017  . GI bleed 07/08/2017  . Anemia 07/08/2017  . Angiodysplasia of stomach and duodenum with hemorrhage   . CVA (cerebral vascular accident) ([place]) 07/06/2017  . Stroke (cerebrum) ([place])   . Goals of care, counseling/discussion   . Palliative care encounter   . COPD exacerbation ([place]) 04/03/2017  . Flu 03/27/2017  . Iron deficiency  anemia due to chronic blood loss   . Migraine 02/13/2017  . Cataracts, bilateral 01/15/2017  . Chronic venous insufficiency 10/06/2016  . Recurrent major depressive disorder, in partial remission ([place]) 10/06/2016  . Left-sided weakness 10/03/2016  . Chest pain 02/27/2016  . History of stroke 02/19/2016  . Thumb pain, left 01/22/2016  . Traumatic closed nondisplaced fracture of base of metacarpal bone of left thumb 01/22/2016  . Colon cancer screening 11/27/2015  . Depression 11/14/2015  . Coronary artery disease 11/13/2015  . Hypertension 11/13/2015  . History of skin cancer 11/13/2015  . Osteoporosis 11/13/2015  . Chronic low back pain 11/13/2015  . Left medial knee pain 11/13/2015  . Osteoarthritis of multiple joints 11/13/2015  . Other specified hypothyroidism 08/16/2015  . Controlled type 2 diabetes mellitus with diabetic neuropathy ([place]) 08/16/2015  . COPD (chronic obstructive pulmonary disease) ([place]) 08/16/2015  . Tobacco abuse 08/16/2015  . HLD (hyperlipidemia) 08/16/2015  . History of CHF (congestive heart failure) 08/16/2015  . Seizure disorder [facility])     Past Surgical History:  Procedure Laterality Date  . ABDOMINAL HYSTERECTOMY  1976  . APPENDECTOMY  1980  . BACK SURGERY    . CAROTID ENDARTERECTOMY    . CHOLECYSTECTOMY  1980  . COLONOSCOPY N/A 02/20/2017   Procedure: COLONOSCOPY;  Surgeon: Lin Landsman, MD;  Location: [facility] ENDOSCOPY;  Service: Gastroenterology;  Laterality: N/A;  . ESOPHAGOGASTRODUODENOSCOPY N/A 02/20/2017   Procedure: ESOPHAGOGASTRODUODENOSCOPY (EGD);  Surgeon: Lin Landsman, MD;  Location[facility] END[facility]COPY;  Service: Gastroenterology;  Laterality: N/A;  . ESOPHAGOGASTRODUODENOSCOPY (EGD) WITH PROPOFOL N/A 07/08/2017   Procedure: ESOPHAGOGASTRODUODENOSCOPY (EGD) WITH PROPOFOL;  Surgeon: [name], MD;  Location: [facility] ENDOSCOPY;  Service: Endoscopy;  Laterality: N/A;  . [place]  . GIVENS CAPSULE STUDY N/A 07/09/2017    Procedure: GIVENS CAPSULE STUDY;  Surgeon: Jonathon Bellows, MD;  Location: [facility] ENDOSCOPY;  Service: Gastroenterology;  Laterality: N/A;  . KNEE SURGERY     left  . KYPHOPLASTY N/A 03/16/2018   Procedure: KYPHOPLASTY, L5;  Surgeon: Hessie Knows, MD;  Location: [facility] ORS;  Service: Orthopedics;  Laterality: N/A;  . ROTATOR CUFF REPAIR     right  . TONSILLECTOMY  1950    Prior to Admission medications   Medication Sig Start Date End Date Taking? Authorizing Provider  albuterol (PROVENTIL HFA;VENTOLIN HFA) 108 (90 Base) MCG/ACT inhaler Inhale 2 puffs into the lungs every 6 (six) hours as needed for wheezing or shortness of breath. 10/08/16   Karamalegos, Devonne Doughty, DO  aspirin (ASPIRIN CHILDRENS) 81 MG chewable tablet Chew 1 tablet (81 mg total) by mouth daily. 03/17/18   Bettey Costa, MD  atorvastatin (LIPITOR) 80 MG tablet Take 1 tablet (80 mg total) by mouth daily at 6 PM. 09/24/16   Karamalegos, Devonne Doughty, DO  azelastine (ASTELIN) 0.1 % nasal spray Place 2 sprays into both nostrils 2 (two) times daily. 10/08/18   [provider]  clopidogrel (PLAVIX) 75 MG tablet Take 75 mg by mouth daily. 10/07/18   [provider]  colestipol (COLESTID) 1 g tablet Take 2 g by mouth 2 (two) times daily. 10/13/17   [provider]  DULoxetine (CYMBALTA) 60 MG capsule Take 1 capsule (60 mg total) by mouth at bedtime. 09/24/16   Karamalegos, Devonne Doughty, DO  ferrous sulfate 325 (65 FE) MG EC tablet Take 1 tablet (325 mg total) by mouth 2 (two) times daily with a meal. Patient taking differently: Take 325 mg by mouth daily.  02/21/17   Hillary Bow, MD  furosemide (LASIX) 20 MG tablet Take 20 mg by mouth 2 (two) times daily. 10/07/18   [provider]  gabapentin (NEURONTIN) 800 MG tablet Take 1 tablet (800 mg total) by mouth 2 (two) times daily. 09/24/16   Karamalegos, Devonne Doughty, DO  glipiZIDE-metformin (METAGLIP) 5-500 MG tablet Take 1 tablet by mouth 2 (two) times daily before a meal.  Patient taking differently: Take 1-2 tablets by mouth See admin instructions. Take 2 tablets by mouth every morning before breakfast and take 1 tablet by mouth every evening before dinner 04/02/17   Parks Ranger, Devonne Doughty, DO  levETIRAcetam (KEPPRA) 500 MG tablet Take 1 tablet (500 mg total) by mouth 2 (two) times daily. 09/19/15   Epifanio Lesches, MD  levothyroxine (SYNTHROID, LEVOTHROID) 112 MCG tablet Take 1 tablet (112 mcg total) by mouth daily before breakfast. 09/24/17   Geradine Girt, DO  nitroGLYCERIN (NITROSTAT) 0.4 MG SL tablet Place 1 tablet (0.4 mg total) under the tongue every 5 (five) minutes as needed for chest pain. 02/28/16   Bettey Costa, MD  omeprazole (PRILOSEC) 20 MG capsule Take 20 mg by mouth 2 (two) times daily. 09/02/17   [provider]  potassium chloride (K-DUR) 10 MEQ tablet Take 1 tablet (10 mEq total) by mouth daily. 06/22/18   Earleen Newport, MD  topiramate (TOPAMAX) 25 MG tablet Take 25 mg by mouth 2 (two) times daily. 10/07/18   [provider]  umeclidinium bromide (INCRUSE ELLIPTA) 62.5 MCG/INH AEPB Inhale 1 puff into the lungs daily. 11/13/15   Karamalegos, Devonne Doughty, DO  vitamin B-12 (CYANOCOBALAMIN) 500 MCG tablet Take 500 mcg by mouth daily.     [provider]    Allergies Ivp dye [iodinated diagnostic agents], Methotrexate, Morphine and related, Mushroom extract complex, Atenolol, Percocet [oxycodone-acetaminophen], Percodan [oxycodone-aspirin], Tramadol, Verapamil, and Oxycodone hcl  Family History  Problem Relation Age of Onset  . Breast cancer Mother   . Heart disease Mother   . Stroke Mother   . Cancer Mother   . COPD Mother   . Diabetes Mother   . Heart disease Father   . Diabetes Father   . Stroke Father   . Alcohol abuse Sister   . Drug abuse Sister   . Stroke Sister   . Cancer Sister   . Mental illness Sister   . Heart disease Brother   . Arthritis Brother   . Diabetes Brother   . Heart disease  Maternal Grandfather   . Heart disease Paternal Grandfather     Social History Social History   Tobacco Use  . Smoking status: Current Every Day Smoker    Packs/day: 1.00    Years: 53.00    Pack years: 53.00    Types: Cigarettes  . Smokeless tobacco: Never Used  Substance Use Topics  . Alcohol use: No  . Drug use: No    Review of Systems  Constitutional: No fever/chills Eyes: No visual changes. ENT: No sore throat. Respiratory: Denies cough Genitourinary: Negative for dysuria. Musculoskeletal: Negative for back pain.  Positive left wrist pain Skin: Negative for rash.    ____________________________________________   PHYSICAL EXAM:  VITAL SIGNS: ED Triage Vitals  Enc Vitals Group     BP 11/27/18 1146 (!) 141/58     Pulse Rate 11/27/18 1146 (!) 52     Resp 11/27/18 1146 18     Temp 11/27/18 1146 97.9 F (36.6 C)     Temp Source 11/27/18 1146 Oral     SpO2 11/27/18 1146 99 %     Weight 11/27/18 1147 175 lb (79.4 kg)     Height 11/27/18 1147 5\' 1"  (1.549 m)     Head Circumference --      Peak Flow --      Pain Score 11/27/18 1147 9     Pain Loc --      Pain Edu? --      Excl. in [place]? --     Constitutional: Alert and oriented. Well appearing and in no acute distress. Eyes: Conjunctivae are normal.  Head: Atraumatic. Nose: No congestion/rhinnorhea. Mouth/Throat: Mucous membranes are moist.   Neck:  supple no lymphadenopathy noted Cardiovascular: Normal rate, regular rhythm.  Respiratory: Normal respiratory effort.  No retractions,  GU: deferred Musculoskeletal: Limited range of motion of the left wrist secondary discomfort, swelling and bruising noted at the left wrist, bruise noted extending into the forearm.  Neurovascular is intact  neurologic:  Normal speech and language.  Skin:  Skin is warm, dry and intact. No rash noted. Psychiatric: Mood and affect are normal. Speech and behavior are normal.  ____________________________________________   LABS  (all labs ordered are listed, but only abnormal results are displayed)  Labs Reviewed - No data to display ____________________________________________   ____________________________________________  RADIOLOGY  X-ray of the left wrist is negative for any acute injury  ____________________________________________   PROCEDURES  Procedure(s) performed: Cock-up splint applied by the nursing  staff   Procedures    ____________________________________________   INITIAL IMPRESSION / ASSESSMENT AND PLAN / ED COURSE  Pertinent labs & imaging results that were available during my care of the patient were reviewed by me and considered in my medical decision making (see chart for details).   Patient 73 year old female presents emergency department left wrist pain.  See HPI  Physical exam shows left wrist to be tender swollen and bruised.  X-ray of the left wrist   X-ray of the left wrist is negative.  Patient was placed in a Velcro cock-up splint for comfort.  She is to follow-up with her regular doctor or orthopedics if not better in 5 to 7 days.  Return emergency department worsening.  Apply ice.  Take Tylenol for pain as needed.  She states she understands will comply.  She is discharged stable condition and given a cab voucher. Laquita Lecount was evaluated in Emergency Department on 11/27/2018 for the symptoms described in the history of present illness. She was evaluated in the context of the global COVID-19 pandemic, which necessitated consideration that the patient might be at risk for infection with the SARS-CoV-2 virus that causes COVID-19. Institutional protocols and algorithms that pertain to the evaluation of patients at risk for COVID-19 are in a state of rapid change based on information released by regulatory bodies including the CDC and federal and state organizations. These policies and algorithms were followed during the patient's care in the ED.   As part of my  medical decision making, I reviewed the following data within the [facility] notes reviewed and incorporated, Old chart reviewed, Radiograph reviewed x-ray left wrist is negative, Notes from prior ED visits and [place] Controlled Substance Database  ____________________________________________   FINAL CLINICAL IMPRESSION(S) / ED DIAGNOSES  Final diagnoses:  Contusion of left wrist, initial encounter      NEW MEDICATIONS STARTED DURING THIS VISIT:  Discharge Medication List as of 11/27/2018 12:40 PM       Note:  This document was prepared using Dragon voice recognition software and may include unintentional dictation errors.    Versie Starks, PA-C 11/27/18 1302    Lavonia Drafts, MD 11/27/18 [phone]

## 2018-11-27 NOTE — ED Triage Notes (Signed)
Presents via EMS from home  States she was holding a screen open  And trying to get her w/c  The w/c slipped and hit left wrist  Denies any fall

## 2018-11-29 DIAGNOSIS — I110 Hypertensive heart disease with heart failure: Secondary | ICD-10-CM | POA: Diagnosis not present

## 2018-11-29 DIAGNOSIS — J449 Chronic obstructive pulmonary disease, unspecified: Secondary | ICD-10-CM | POA: Diagnosis not present

## 2018-11-29 DIAGNOSIS — I69354 Hemiplegia and hemiparesis following cerebral infarction affecting left non-dominant side: Secondary | ICD-10-CM | POA: Diagnosis not present

## 2018-11-29 DIAGNOSIS — I4891 Unspecified atrial fibrillation: Secondary | ICD-10-CM | POA: Diagnosis not present

## 2018-11-29 DIAGNOSIS — E119 Type 2 diabetes mellitus without complications: Secondary | ICD-10-CM | POA: Diagnosis not present

## 2018-11-29 DIAGNOSIS — I5032 Chronic diastolic (congestive) heart failure: Secondary | ICD-10-CM | POA: Diagnosis not present

## 2018-12-03 DIAGNOSIS — I69354 Hemiplegia and hemiparesis following cerebral infarction affecting left non-dominant side: Secondary | ICD-10-CM | POA: Diagnosis not present

## 2018-12-03 DIAGNOSIS — I5032 Chronic diastolic (congestive) heart failure: Secondary | ICD-10-CM | POA: Diagnosis not present

## 2018-12-03 DIAGNOSIS — I110 Hypertensive heart disease with heart failure: Secondary | ICD-10-CM | POA: Diagnosis not present

## 2018-12-03 DIAGNOSIS — J449 Chronic obstructive pulmonary disease, unspecified: Secondary | ICD-10-CM | POA: Diagnosis not present

## 2018-12-03 DIAGNOSIS — E119 Type 2 diabetes mellitus without complications: Secondary | ICD-10-CM | POA: Diagnosis not present

## 2018-12-03 DIAGNOSIS — I4891 Unspecified atrial fibrillation: Secondary | ICD-10-CM | POA: Diagnosis not present

## 2018-12-13 DIAGNOSIS — Z7982 Long term (current) use of aspirin: Secondary | ICD-10-CM | POA: Diagnosis not present

## 2018-12-13 DIAGNOSIS — I4891 Unspecified atrial fibrillation: Secondary | ICD-10-CM | POA: Diagnosis not present

## 2018-12-13 DIAGNOSIS — S32050D Wedge compression fracture of fifth lumbar vertebra, subsequent encounter for fracture with routine healing: Secondary | ICD-10-CM | POA: Diagnosis not present

## 2018-12-13 DIAGNOSIS — G4089 Other seizures: Secondary | ICD-10-CM | POA: Diagnosis not present

## 2018-12-13 DIAGNOSIS — Z7984 Long term (current) use of oral hypoglycemic drugs: Secondary | ICD-10-CM | POA: Diagnosis not present

## 2018-12-13 DIAGNOSIS — I110 Hypertensive heart disease with heart failure: Secondary | ICD-10-CM | POA: Diagnosis not present

## 2018-12-13 DIAGNOSIS — I69354 Hemiplegia and hemiparesis following cerebral infarction affecting left non-dominant side: Secondary | ICD-10-CM | POA: Diagnosis not present

## 2018-12-13 DIAGNOSIS — I5032 Chronic diastolic (congestive) heart failure: Secondary | ICD-10-CM | POA: Diagnosis not present

## 2018-12-13 DIAGNOSIS — E119 Type 2 diabetes mellitus without complications: Secondary | ICD-10-CM | POA: Diagnosis not present

## 2018-12-13 DIAGNOSIS — Z87891 Personal history of nicotine dependence: Secondary | ICD-10-CM | POA: Diagnosis not present

## 2018-12-13 DIAGNOSIS — F17210 Nicotine dependence, cigarettes, uncomplicated: Secondary | ICD-10-CM | POA: Diagnosis not present

## 2018-12-13 DIAGNOSIS — J449 Chronic obstructive pulmonary disease, unspecified: Secondary | ICD-10-CM | POA: Diagnosis not present

## 2019-04-22 ENCOUNTER — Other Ambulatory Visit: Payer: Self-pay | Admitting: Student

## 2019-04-22 DIAGNOSIS — M7581 Other shoulder lesions, right shoulder: Secondary | ICD-10-CM

## 2019-04-22 DIAGNOSIS — M7521 Bicipital tendinitis, right shoulder: Secondary | ICD-10-CM

## 2019-04-22 DIAGNOSIS — Z9181 History of falling: Secondary | ICD-10-CM

## 2019-05-06 ENCOUNTER — Ambulatory Visit: Admission: RE | Admit: 2019-05-06 | Payer: Medicaid Other | Source: Ambulatory Visit

## 2019-05-17 ENCOUNTER — Other Ambulatory Visit: Payer: Self-pay

## 2019-05-17 ENCOUNTER — Ambulatory Visit
Admission: RE | Admit: 2019-05-17 | Discharge: 2019-05-17 | Disposition: A | Discharge status: Home / Self Care | Payer: Medicare Other | Source: Ambulatory Visit | Attending: Student | Admitting: Student

## 2019-05-17 DIAGNOSIS — M7581 Other shoulder lesions, right shoulder: Secondary | ICD-10-CM | POA: Diagnosis not present

## 2019-05-17 DIAGNOSIS — Z9181 History of falling: Secondary | ICD-10-CM | POA: Insufficient documentation

## 2019-05-17 DIAGNOSIS — M7521 Bicipital tendinitis, right shoulder: Secondary | ICD-10-CM

## 2019-06-15 ENCOUNTER — Encounter: Payer: Self-pay | Admitting: *Deleted

## 2019-06-15 ENCOUNTER — Other Ambulatory Visit: Payer: Self-pay

## 2019-06-15 ENCOUNTER — Emergency Department
Admission: EM | Admit: 2019-06-15 | Discharge: 2019-06-15 | Disposition: A | Discharge status: Home / Self Care | Payer: Medicare Other | Attending: Emergency Medicine | Admitting: Emergency Medicine

## 2019-06-15 ENCOUNTER — Emergency Department: Payer: Medicare Other

## 2019-06-15 DIAGNOSIS — Z5321 Procedure and treatment not carried out due to patient leaving prior to being seen by health care provider: Secondary | ICD-10-CM | POA: Diagnosis not present

## 2019-06-15 DIAGNOSIS — R42 Dizziness and giddiness: Secondary | ICD-10-CM | POA: Diagnosis not present

## 2019-06-15 DIAGNOSIS — R519 Headache, unspecified: Secondary | ICD-10-CM | POA: Insufficient documentation

## 2019-06-15 LAB — CBC
HCT: 40.9 % (ref 36.0–46.0)
Hemoglobin: 13.6 g/dL (ref 12.0–15.0)
MCH: 29.7 pg (ref 26.0–34.0)
MCHC: 33.3 g/dL (ref 30.0–36.0)
MCV: 89.3 fL (ref 80.0–100.0)
Platelets: 293 10*3/uL (ref 150–400)
RBC: 4.58 MIL/uL (ref 3.87–5.11)
RDW: 14.6 % (ref 11.5–15.5)
WBC: 7.6 10*3/uL (ref 4.0–10.5)
nRBC: 0 % (ref 0.0–0.2)

## 2019-06-15 LAB — BASIC METABOLIC PANEL
Anion gap: 9 (ref 5–15)
BUN: 10 mg/dL (ref 8–23)
CO2: 28 mmol/L (ref 22–32)
Calcium: 9.6 mg/dL (ref 8.9–10.3)
Chloride: 106 mmol/L (ref 98–111)
Creatinine, Ser: 0.72 mg/dL (ref 0.44–1.00)
GFR calc Af Amer: >60 mL/min (ref >60)
GFR calc non Af Amer: >60 mL/min (ref >60)
Glucose, Bld: 139 mg/dL — ABNORMAL HIGH (ref 70–99)
Potassium: 3.7 mmol/L (ref 3.5–5.1)
Sodium: 143 mmol/L (ref 135–145)

## 2019-06-15 LAB — TROPONIN I (HIGH SENSITIVITY): Troponin I (High Sensitivity): 5 ng/L (ref <18)

## 2019-06-15 MED ORDER — SODIUM CHLORIDE 0.9% FLUSH: 3.0000 mL | Freq: Once | INTRAVENOUS | Status: DC

## 2019-06-15 NOTE — ED Triage Notes (Signed)
Pt brought in via ems from home in a wheelchair to triage. Pt reports headache since this am and noticed left eyelid drooping more than usual.  Pt reports dizziness.   Hx tia, cva.  Pt alert  Speech clear.  Iv in place.

## 2019-08-11 ENCOUNTER — Other Ambulatory Visit: Payer: Self-pay | Admitting: Surgery

## 2019-08-17 ENCOUNTER — Other Ambulatory Visit: Payer: Medicare Other

## 2019-08-22 ENCOUNTER — Encounter
Admission: RE | Admit: 2019-08-22 | Discharge: 2019-08-22 | Disposition: A | Discharge status: Home / Self Care | Payer: Medicare Other | Source: Ambulatory Visit | Attending: Surgery | Admitting: Surgery

## 2019-08-22 ENCOUNTER — Other Ambulatory Visit: Payer: Medicare Other

## 2019-08-22 ENCOUNTER — Other Ambulatory Visit: Payer: Self-pay

## 2019-08-22 DIAGNOSIS — Z01818 Encounter for other preprocedural examination: Secondary | ICD-10-CM | POA: Diagnosis not present

## 2019-08-22 DIAGNOSIS — Z01810 Encounter for preprocedural cardiovascular examination: Secondary | ICD-10-CM

## 2019-08-22 LAB — CBC WITH DIFFERENTIAL/PLATELET
Abs Immature Granulocytes: 0.02 10*3/uL (ref 0.00–0.07)
Basophils Absolute: 0.1 10*3/uL (ref 0.0–0.1)
Basophils Relative: 1 %
Eosinophils Absolute: 0.1 10*3/uL (ref 0.0–0.5)
Eosinophils Relative: 1 %
HCT: 38.5 % (ref 36.0–46.0)
Hemoglobin: 12.8 g/dL (ref 12.0–15.0)
Immature Granulocytes: 0 %
Lymphocytes Relative: 28 %
Lymphs Abs: 1.9 10*3/uL (ref 0.7–4.0)
MCH: 29.6 pg (ref 26.0–34.0)
MCHC: 33.2 g/dL (ref 30.0–36.0)
MCV: 88.9 fL (ref 80.0–100.0)
Monocytes Absolute: 0.6 10*3/uL (ref 0.1–1.0)
Monocytes Relative: 9 %
Neutro Abs: 4.1 10*3/uL (ref 1.7–7.7)
Neutrophils Relative %: 61 %
Platelets: 268 10*3/uL (ref 150–400)
RBC: 4.33 MIL/uL (ref 3.87–5.11)
RDW: 14.3 % (ref 11.5–15.5)
WBC: 6.8 10*3/uL (ref 4.0–10.5)
nRBC: 0 % (ref 0.0–0.2)

## 2019-08-22 LAB — COMPREHENSIVE METABOLIC PANEL
ALT: 19 U/L (ref 0–44)
AST: 20 U/L (ref 15–41)
Albumin: 3.9 g/dL (ref 3.5–5.0)
Alkaline Phosphatase: 96 U/L (ref 38–126)
Anion gap: 7 (ref 5–15)
BUN: 11 mg/dL (ref 8–23)
CO2: 27 mmol/L (ref 22–32)
Calcium: 8.7 mg/dL — ABNORMAL LOW (ref 8.9–10.3)
Chloride: 106 mmol/L (ref 98–111)
Creatinine, Ser: 0.63 mg/dL (ref 0.44–1.00)
GFR calc Af Amer: >60 mL/min (ref >60)
GFR calc non Af Amer: >60 mL/min (ref >60)
Glucose, Bld: 117 mg/dL — ABNORMAL HIGH (ref 70–99)
Potassium: 3.5 mmol/L (ref 3.5–5.1)
Sodium: 140 mmol/L (ref 135–145)
Total Bilirubin: 0.6 mg/dL (ref 0.3–1.2)
Total Protein: 6.7 g/dL (ref 6.5–8.1)

## 2019-08-22 LAB — TYPE AND SCREEN
ABO/RH(D): O POS
Antibody Screen: NEGATIVE

## 2019-08-22 LAB — SURGICAL PCR SCREEN
MRSA, PCR: NEGATIVE
Staphylococcus aureus: NEGATIVE

## 2019-08-22 NOTE — Patient Instructions (Addendum)
Your procedure is scheduled on:08/25/19  Report to [place]. To find out your arrival time please call [phone] between 1PM - 3PM on 08/24/19.  Remember: Instructions that are not followed completely may result in serious medical risk, up to and including death, or upon the discretion of your surgeon and anesthesiologist your surgery may need to be rescheduled.     _X__ 1. Do not eat food after midnight the night before your procedure.                 No gum chewing or hard candies. You may drink clear liquids up to 2 hours                 before you are scheduled to arrive for your surgery- DO not drink clear                 liquids within 2 hours of the start of your surgery.                 Clear Liquids include:  water, apple juice without pulp, clear carbohydrate                 drink such as Clearfast or Gatorade, Black Coffee or Tea (Do not add                 anything to coffee or tea). Diabetics water only  __X__2.  On the morning of surgery brush your teeth with toothpaste and water, you                 may rinse your mouth with mouthwash if you wish.  Do not swallow any              toothpaste of mouthwash.     _X__ 3.  No Alcohol for 24 hours before or after surgery.   _X__ 4.  Do Not Smoke or use e-cigarettes For 24 Hours Prior to Your Surgery.                 Do not use any chewable tobacco products for at least 6 hours prior to                 surgery.  ____  5.  Bring all medications with you on the day of surgery if instructed.   __X__  6.  Notify your doctor if there is any change in your medical condition      (cold, fever, infections).     Do not wear jewelry, make-up, hairpins, clips or nail polish. Do not wear lotions, powders, or perfumes.  Do not shave 48 hours prior to surgery. Men may shave face and neck. Do not bring valuables to the hospital.    [facility] is not responsible for any belongings or  valuables.  Contacts, dentures/partials or body piercings may not be worn into surgery. Bring a case for your contacts, glasses or hearing aids, a denture cup will be supplied. Leave your suitcase in the car. After surgery it may be brought to your room. For patients admitted to the hospital, discharge time is determined by your treatment team.   Patients discharged the day of surgery will not be allowed to drive home.   Please read over the following fact sheets that you were given:   MRSA Information  __X__ Take these medicines the morning of surgery with A SIP OF WATER:  1. gabapentin (NEURONTIN) 800 MG tablet  2. levETIRAcetam (KEPPRA) 500 MG tablet  3. omeprazole (PRILOSEC) 20 MG capsule  4. topiramate (TOPAMAX) 25 MG tablet  5.  6.  ____ Fleet Enema (as directed)   __X__ Use CHG Soap/SAGE wipes as directed  __X__ Use inhalers on the day of surgery  __X_ Stop metformin/Janumet/Farxiga 2 days prior to surgery HOLD GLIPIZIDE/METFORMIN 2 DAYS   ____ Take 1/2 of usual insulin dose the night before surgery. No insulin the morning          of surgery.   __X__ Stop Blood Thinners Coumadin/Plavix/Xarelto/Pleta/Pradaxa/Eliquis/Effient/Aspirin  on   Or contact your Surgeon, Cardiologist or Medical Doctor regarding  ability to stop your blood thinners  __X__ Stop Anti-inflammatories 7 days before surgery such as Advil, Ibuprofen, Motrin,  BC or Goodies Powder, Naprosyn, Naproxen, Aleve, Aspirin    __X__ Stop all herbal supplements, fish oil or vitamin E until after surgery.    ____ Bring C-Pap to the hospital.     How to Use an Incentive Spirometer An incentive spirometer is a tool that measures how well you are filling your lungs with each breath. Learning to take long, deep breaths using this tool can help you keep your lungs clear and active. This may help to reverse or lessen your chance of developing breathing (pulmonary) problems, especially infection. You may be asked to  use a spirometer:  After a surgery.  If you have a lung problem or a history of smoking.  After a long period of time when you have been unable to move or be active. If the spirometer includes an indicator to show the highest number that you have reached, your health care provider or respiratory therapist will help you set a goal. Keep a list (log) of your progress as told by your health care provider. What are the risks?  Breathing too quickly may cause dizziness or cause you to pass out. Take your time so you do not get dizzy or light-headed.  If you are in pain, you may need to take pain medicine before doing incentive spirometry. It is harder to take a deep breath if you are having pain. How to use your incentive spirometer  1. Sit up on the edge of your bed or on a chair. 2. Hold the incentive spirometer so that it is in an upright position. 3. Before you use the spirometer, breathe out normally. 4. Place the mouthpiece in your mouth. Make sure your lips are closed tightly around it. 5. Breathe in slowly and as deeply as you can through your mouth, causing the piston or the ball to rise toward the top of the chamber. 6. Hold your breath for 3-5 seconds, or for as long as possible. ? If the spirometer includes a coach indicator, use this to guide you in breathing. Slow down your breathing if the indicator goes above the marked areas. 7. Remove the mouthpiece from your mouth and breathe out normally. The piston or ball will return to the bottom of the chamber. 8. Rest for a few seconds, then repeat the steps 10 or more times. ? Take your time and take a few normal breaths between deep breaths so that you do not get dizzy or light-headed. ? Do this every 1-2 hours when you are awake. 9. If the spirometer includes a goal marker to show the highest number you have reached (best effort), use this as a goal to work toward during each repetition. 10. After each  set of 10 deep breaths, cough a  few times. This will help to make sure that your lungs are clear. ? If you have an incision on your chest or abdomen from surgery, place a pillow or a rolled-up towel firmly against the incision when you cough. This can help to reduce pain from coughing. General tips  When you become able to get out of bed, walk around often and continue to cough to help clear your lungs.  Keep using the incentive spirometer until your health care provider says it is okay to stop using it. If you have been in the hospital, you may be told to keep using the spirometer at home. Contact a health care provider if:  You are having difficulty using the spirometer.  You have trouble using the spirometer as often as instructed.  Your pain medicine is not giving enough relief for you to use the spirometer as told.  You have a fever.  You develop shortness of breath. Get help right away if:  You develop a cough with bloody mucus from the lungs (bloody sputum).  You have fluid or blood coming from an incision site after you cough. Summary  An incentive spirometer is a tool that can help you learn to take long, deep breaths to keep your lungs clear and active.  You may be asked to use a spirometer after a surgery, if you have a lung problem or a history of smoking, or if you have been inactive for a long period of time.  Use your incentive spirometer as instructed every 1-2 hours while you are awake.  If you have an incision on your chest or abdomen, place a pillow or a rolled-up towel firmly against your incision when you cough. This will help to reduce pain. This information is not intended to replace advice given to you by your health care provider. Make sure you discuss any questions you have with your health care provider. Document Revised: 07/30/2018 Document Reviewed: 11/12/2016 Elsevier Patient Education  2020 [employer].

## 2019-08-23 ENCOUNTER — Other Ambulatory Visit
Admission: RE | Admit: 2019-08-23 | Discharge: 2019-08-23 | Disposition: A | Discharge status: Home / Self Care | Payer: Medicare Other | Source: Ambulatory Visit | Attending: Surgery | Admitting: Surgery

## 2019-08-23 DIAGNOSIS — Z01812 Encounter for preprocedural laboratory examination: Secondary | ICD-10-CM | POA: Insufficient documentation

## 2019-08-23 DIAGNOSIS — Z20822 Contact with and (suspected) exposure to covid-19: Secondary | ICD-10-CM | POA: Insufficient documentation

## 2019-08-23 LAB — SARS CORONAVIRUS 2 (TAT 6-24 HRS): SARS Coronavirus 2: NEGATIVE

## 2019-08-24 MED ORDER — CEFAZOLIN SODIUM-DEXTROSE 2-4 GM/100ML-% IV SOLN
2.0000 g | INTRAVENOUS | Status: AC
  Administered 2019-08-25: 2 g via INTRAVENOUS

## 2019-08-25 ENCOUNTER — Encounter: Payer: Self-pay | Admitting: Surgery

## 2019-08-25 ENCOUNTER — Inpatient Hospital Stay: Payer: Medicare Other

## 2019-08-25 ENCOUNTER — Inpatient Hospital Stay: Payer: Medicare Other | Admitting: Anesthesiology

## 2019-08-25 ENCOUNTER — Inpatient Hospital Stay
Admission: RE | Admit: 2019-08-25 | Discharge: 2019-08-29 | DRG: 483 | Disposition: A | Discharge status: Skilled Nursing Facility | Payer: Medicare Other | Attending: Surgery | Admitting: Surgery

## 2019-08-25 ENCOUNTER — Other Ambulatory Visit: Payer: Self-pay

## 2019-08-25 ENCOUNTER — Inpatient Hospital Stay: Payer: Medicare Other | Admitting: Urgent Care

## 2019-08-25 ENCOUNTER — Encounter
Admission: RE | Disposition: A | Discharge status: Skilled Nursing Facility | Payer: Self-pay | Source: Home / Self Care | Attending: Surgery

## 2019-08-25 DIAGNOSIS — I110 Hypertensive heart disease with heart failure: Secondary | ICD-10-CM | POA: Diagnosis present

## 2019-08-25 DIAGNOSIS — I252 Old myocardial infarction: Secondary | ICD-10-CM

## 2019-08-25 DIAGNOSIS — M75121 Complete rotator cuff tear or rupture of right shoulder, not specified as traumatic: Secondary | ICD-10-CM | POA: Diagnosis present

## 2019-08-25 DIAGNOSIS — M19011 Primary osteoarthritis, right shoulder: Secondary | ICD-10-CM | POA: Diagnosis present

## 2019-08-25 DIAGNOSIS — Z7902 Long term (current) use of antithrombotics/antiplatelets: Secondary | ICD-10-CM | POA: Diagnosis not present

## 2019-08-25 DIAGNOSIS — I2510 Atherosclerotic heart disease of native coronary artery without angina pectoris: Secondary | ICD-10-CM | POA: Diagnosis present

## 2019-08-25 DIAGNOSIS — M7521 Bicipital tendinitis, right shoulder: Secondary | ICD-10-CM | POA: Diagnosis present

## 2019-08-25 DIAGNOSIS — Z96611 Presence of right artificial shoulder joint: Secondary | ICD-10-CM

## 2019-08-25 DIAGNOSIS — E785 Hyperlipidemia, unspecified: Secondary | ICD-10-CM | POA: Diagnosis present

## 2019-08-25 DIAGNOSIS — I5032 Chronic diastolic (congestive) heart failure: Secondary | ICD-10-CM | POA: Diagnosis present

## 2019-08-25 DIAGNOSIS — Z888 Allergy status to other drugs, medicaments and biological substances status: Secondary | ICD-10-CM

## 2019-08-25 DIAGNOSIS — Z91018 Allergy to other foods: Secondary | ICD-10-CM

## 2019-08-25 DIAGNOSIS — J449 Chronic obstructive pulmonary disease, unspecified: Secondary | ICD-10-CM | POA: Diagnosis present

## 2019-08-25 DIAGNOSIS — Z885 Allergy status to narcotic agent status: Secondary | ICD-10-CM

## 2019-08-25 DIAGNOSIS — G40909 Epilepsy, unspecified, not intractable, without status epilepticus: Secondary | ICD-10-CM | POA: Diagnosis present

## 2019-08-25 DIAGNOSIS — Z91041 Radiographic dye allergy status: Secondary | ICD-10-CM

## 2019-08-25 DIAGNOSIS — Z7982 Long term (current) use of aspirin: Secondary | ICD-10-CM

## 2019-08-25 DIAGNOSIS — Z7984 Long term (current) use of oral hypoglycemic drugs: Secondary | ICD-10-CM

## 2019-08-25 DIAGNOSIS — K219 Gastro-esophageal reflux disease without esophagitis: Secondary | ICD-10-CM | POA: Diagnosis present

## 2019-08-25 DIAGNOSIS — Z85828 Personal history of other malignant neoplasm of skin: Secondary | ICD-10-CM | POA: Diagnosis not present

## 2019-08-25 DIAGNOSIS — F17210 Nicotine dependence, cigarettes, uncomplicated: Secondary | ICD-10-CM | POA: Diagnosis present

## 2019-08-25 DIAGNOSIS — Z20822 Contact with and (suspected) exposure to covid-19: Secondary | ICD-10-CM | POA: Diagnosis present

## 2019-08-25 DIAGNOSIS — Z8249 Family history of ischemic heart disease and other diseases of the circulatory system: Secondary | ICD-10-CM

## 2019-08-25 DIAGNOSIS — E876 Hypokalemia: Secondary | ICD-10-CM | POA: Diagnosis not present

## 2019-08-25 DIAGNOSIS — Z79899 Other long term (current) drug therapy: Secondary | ICD-10-CM

## 2019-08-25 DIAGNOSIS — E039 Hypothyroidism, unspecified: Secondary | ICD-10-CM | POA: Diagnosis present

## 2019-08-25 DIAGNOSIS — Z8673 Personal history of transient ischemic attack (TIA), and cerebral infarction without residual deficits: Secondary | ICD-10-CM

## 2019-08-25 DIAGNOSIS — Z79890 Hormone replacement therapy (postmenopausal): Secondary | ICD-10-CM

## 2019-08-25 HISTORY — PX: REVERSE SHOULDER ARTHROPLASTY: SHX5054

## 2019-08-25 LAB — URINALYSIS, COMPLETE (UACMP) WITH MICROSCOPIC
Bacteria, UA: NONE SEEN
Bilirubin Urine: NEGATIVE
Glucose, UA: NEGATIVE mg/dL
Hgb urine dipstick: NEGATIVE
Ketones, ur: NEGATIVE mg/dL
Leukocytes,Ua: NEGATIVE
Nitrite: NEGATIVE
Protein, ur: NEGATIVE mg/dL
Specific Gravity, Urine: 1.019 (ref 1.005–1.030)
pH: 6 (ref 5.0–8.0)

## 2019-08-25 LAB — GLUCOSE, CAPILLARY
Glucose-Capillary: 132 mg/dL — ABNORMAL HIGH (ref 70–99)
Glucose-Capillary: 184 mg/dL — ABNORMAL HIGH (ref 70–99)
Glucose-Capillary: 236 mg/dL — ABNORMAL HIGH (ref 70–99)

## 2019-08-25 SURGERY — ARTHROPLASTY, SHOULDER, TOTAL, REVERSE
Anesthesia: General | Site: Shoulder | Laterality: Right

## 2019-08-25 MED ORDER — MEPERIDINE HCL 50 MG/ML IJ SOLN: 6.2500 mg | INTRAMUSCULAR | Status: DC | PRN

## 2019-08-25 MED ORDER — ONDANSETRON HCL 4 MG/2ML IJ SOLN
INTRAMUSCULAR | Status: DC | PRN
  Administered 2019-08-25: 4 mg via INTRAVENOUS

## 2019-08-25 MED ORDER — ACETAMINOPHEN 10 MG/ML IV SOLN
INTRAVENOUS | Status: DC | PRN
  Administered 2019-08-25: 1000 mg via INTRAVENOUS

## 2019-08-25 MED ORDER — CHLORHEXIDINE GLUCONATE 0.12 % MT SOLN
15.0000 mL | Freq: Once | OROMUCOSAL | Status: AC
  Administered 2019-08-25: 15 mL via OROMUCOSAL

## 2019-08-25 MED ORDER — METOCLOPRAMIDE HCL 10 MG PO TABS: 5.0000 mg | ORAL_TABLET | Freq: Three times a day (TID) | ORAL | Status: DC | PRN

## 2019-08-25 MED ORDER — SODIUM CHLORIDE 0.9 % IV SOLN: INTRAVENOUS | Status: DC

## 2019-08-25 MED ORDER — PROPOFOL 10 MG/ML IV BOLUS
INTRAVENOUS | Status: DC | PRN
  Administered 2019-08-25: 100 mg via INTRAVENOUS
  Administered 2019-08-25: 30 mg via INTRAVENOUS

## 2019-08-25 MED ORDER — DULOXETINE HCL 60 MG PO CPEP
60.0000 mg | ORAL_CAPSULE | Freq: Every day | ORAL | Status: DC
  Administered 2019-08-26 – 2019-08-29 (×4): 60 mg via ORAL
  Filled 2019-08-25 (×5): qty 1

## 2019-08-25 MED ORDER — GLIPIZIDE-METFORMIN HCL 5-500 MG PO TABS: 1.0000 | ORAL_TABLET | Freq: Two times a day (BID) | ORAL | Status: DC

## 2019-08-25 MED ORDER — ONDANSETRON HCL 4 MG PO TABS
4.0000 mg | ORAL_TABLET | Freq: Four times a day (QID) | ORAL | Status: DC | PRN
  Administered 2019-08-26: 4 mg via ORAL
  Filled 2019-08-25: qty 1

## 2019-08-25 MED ORDER — LEVOTHYROXINE SODIUM 25 MCG PO TABS
125.0000 ug | ORAL_TABLET | Freq: Every day | ORAL | Status: DC
  Administered 2019-08-26 – 2019-08-29 (×4): 125 ug via ORAL
  Filled 2019-08-25 (×4): qty 1

## 2019-08-25 MED ORDER — CLOPIDOGREL BISULFATE 75 MG PO TABS: 75.0000 mg | ORAL_TABLET | Freq: Every day | ORAL | Status: DC

## 2019-08-25 MED ORDER — KETOROLAC TROMETHAMINE 15 MG/ML IJ SOLN
15.0000 mg | Freq: Once | INTRAMUSCULAR | Status: AC
  Administered 2019-08-25: 15 mg via INTRAVENOUS

## 2019-08-25 MED ORDER — INSULIN ASPART 100 UNIT/ML ~~LOC~~ SOLN
0.0000 [IU] | Freq: Three times a day (TID) | SUBCUTANEOUS | Status: DC
  Administered 2019-08-26: 2 [IU] via SUBCUTANEOUS
  Administered 2019-08-27: 3 [IU] via SUBCUTANEOUS
  Administered 2019-08-27 – 2019-08-29 (×4): 2 [IU] via SUBCUTANEOUS
  Filled 2019-08-25 (×6): qty 1

## 2019-08-25 MED ORDER — LACTATED RINGERS IV SOLN: INTRAVENOUS | Status: DC | PRN

## 2019-08-25 MED ORDER — FAMOTIDINE 20 MG PO TABS
20.0000 mg | ORAL_TABLET | Freq: Once | ORAL | Status: AC
  Administered 2019-08-25: 20 mg via ORAL

## 2019-08-25 MED ORDER — LIDOCAINE HCL (CARDIAC) PF 100 MG/5ML IV SOSY
PREFILLED_SYRINGE | INTRAVENOUS | Status: DC | PRN
  Administered 2019-08-25: 60 mg via INTRAVENOUS

## 2019-08-25 MED ORDER — ROCURONIUM BROMIDE 100 MG/10ML IV SOLN
INTRAVENOUS | Status: DC | PRN
  Administered 2019-08-25: 50 mg via INTRAVENOUS

## 2019-08-25 MED ORDER — PROPOFOL 500 MG/50ML IV EMUL
INTRAVENOUS | Status: AC
  Filled 2019-08-25: qty 50

## 2019-08-25 MED ORDER — FAMOTIDINE 20 MG PO TABS
ORAL_TABLET | ORAL | Status: AC
  Filled 2019-08-25: qty 1

## 2019-08-25 MED ORDER — CHLORHEXIDINE GLUCONATE 0.12 % MT SOLN
OROMUCOSAL | Status: AC
  Filled 2019-08-25: qty 15

## 2019-08-25 MED ORDER — ALBUTEROL SULFATE (2.5 MG/3ML) 0.083% IN NEBU: 3.0000 mL | INHALATION_SOLUTION | Freq: Four times a day (QID) | RESPIRATORY_TRACT | Status: DC | PRN

## 2019-08-25 MED ORDER — ACETAMINOPHEN 325 MG PO TABS
325.0000 mg | ORAL_TABLET | Freq: Four times a day (QID) | ORAL | Status: DC | PRN
  Administered 2019-08-28 – 2019-08-29 (×3): 650 mg via ORAL
  Filled 2019-08-25 (×3): qty 2

## 2019-08-25 MED ORDER — DEXAMETHASONE SODIUM PHOSPHATE 10 MG/ML IJ SOLN
INTRAMUSCULAR | Status: AC
  Filled 2019-08-25: qty 1

## 2019-08-25 MED ORDER — PANTOPRAZOLE SODIUM 40 MG PO TBEC
40.0000 mg | DELAYED_RELEASE_TABLET | Freq: Every day | ORAL | Status: DC
  Administered 2019-08-26 – 2019-08-29 (×4): 40 mg via ORAL
  Filled 2019-08-25 (×5): qty 1

## 2019-08-25 MED ORDER — DIPHENHYDRAMINE HCL 12.5 MG/5ML PO ELIX: 12.5000 mg | ORAL_SOLUTION | ORAL | Status: DC | PRN

## 2019-08-25 MED ORDER — TRANEXAMIC ACID 1000 MG/10ML IV SOLN
INTRAVENOUS | Status: DC | PRN
  Administered 2019-08-25: 1000 mg via TOPICAL

## 2019-08-25 MED ORDER — KETOROLAC TROMETHAMINE 15 MG/ML IJ SOLN
7.5000 mg | Freq: Four times a day (QID) | INTRAMUSCULAR | Status: AC
  Administered 2019-08-25 – 2019-08-26 (×4): 7.5 mg via INTRAVENOUS
  Filled 2019-08-25 (×4): qty 1

## 2019-08-25 MED ORDER — LEVETIRACETAM 500 MG PO TABS
500.0000 mg | ORAL_TABLET | Freq: Two times a day (BID) | ORAL | Status: DC
  Administered 2019-08-25 – 2019-08-29 (×8): 500 mg via ORAL
  Filled 2019-08-25 (×13): qty 1

## 2019-08-25 MED ORDER — CEFAZOLIN SODIUM 1 G IJ SOLR
INTRAMUSCULAR | Status: AC
  Filled 2019-08-25: qty 10

## 2019-08-25 MED ORDER — ORAL CARE MOUTH RINSE: 15.0000 mL | Freq: Once | OROMUCOSAL | Status: AC

## 2019-08-25 MED ORDER — FENTANYL CITRATE (PF) 100 MCG/2ML IJ SOLN
INTRAMUSCULAR | Status: DC | PRN
  Administered 2019-08-25: 50 ug via INTRAVENOUS
  Administered 2019-08-25: 100 ug via INTRAVENOUS
  Administered 2019-08-25: 50 ug via INTRAVENOUS

## 2019-08-25 MED ORDER — GLIPIZIDE 5 MG PO TABS
5.0000 mg | ORAL_TABLET | Freq: Every day | ORAL | Status: DC
  Administered 2019-08-27: 5 mg via ORAL
  Filled 2019-08-25 (×5): qty 1

## 2019-08-25 MED ORDER — VITAMIN B-12 1000 MCG PO TABS
1000.0000 ug | ORAL_TABLET | Freq: Every day | ORAL | Status: DC
  Administered 2019-08-26 – 2019-08-29 (×4): 1000 ug via ORAL
  Filled 2019-08-25 (×4): qty 1

## 2019-08-25 MED ORDER — ATORVASTATIN CALCIUM 20 MG PO TABS
80.0000 mg | ORAL_TABLET | Freq: Every day | ORAL | Status: DC
  Administered 2019-08-26 – 2019-08-27 (×2): 80 mg via ORAL
  Filled 2019-08-25 (×2): qty 4

## 2019-08-25 MED ORDER — ASPIRIN 81 MG PO CHEW
81.0000 mg | CHEWABLE_TABLET | Freq: Every day | ORAL | Status: DC
  Administered 2019-08-26 – 2019-08-29 (×4): 81 mg via ORAL
  Filled 2019-08-25 (×4): qty 1

## 2019-08-25 MED ORDER — SODIUM CHLORIDE 0.9 % IV SOLN
INTRAVENOUS | Status: DC | PRN
  Administered 2019-08-25: 40 mL

## 2019-08-25 MED ORDER — SUCCINYLCHOLINE CHLORIDE 200 MG/10ML IV SOSY
PREFILLED_SYRINGE | INTRAVENOUS | Status: AC
  Filled 2019-08-25: qty 10

## 2019-08-25 MED ORDER — FENTANYL CITRATE (PF) 100 MCG/2ML IJ SOLN
INTRAMUSCULAR | Status: AC
  Filled 2019-08-25: qty 2

## 2019-08-25 MED ORDER — DEXAMETHASONE SODIUM PHOSPHATE 10 MG/ML IJ SOLN
INTRAMUSCULAR | Status: DC | PRN
  Administered 2019-08-25: 10 mg via INTRAVENOUS

## 2019-08-25 MED ORDER — EPHEDRINE 5 MG/ML INJ
INTRAVENOUS | Status: AC
  Filled 2019-08-25: qty 10

## 2019-08-25 MED ORDER — ACETAMINOPHEN 160 MG/5ML PO SOLN
325.0000 mg | ORAL | Status: DC | PRN
  Filled 2019-08-25: qty 20.3

## 2019-08-25 MED ORDER — SUGAMMADEX SODIUM 200 MG/2ML IV SOLN
INTRAVENOUS | Status: DC | PRN
  Administered 2019-08-25: 200 mg via INTRAVENOUS

## 2019-08-25 MED ORDER — GABAPENTIN 400 MG PO CAPS
800.0000 mg | ORAL_CAPSULE | Freq: Two times a day (BID) | ORAL | Status: DC
  Administered 2019-08-25 – 2019-08-29 (×8): 800 mg via ORAL
  Filled 2019-08-25 (×8): qty 2

## 2019-08-25 MED ORDER — FLEET ENEMA 7-19 GM/118ML RE ENEM: 1.0000 | ENEMA | Freq: Once | RECTAL | Status: DC | PRN

## 2019-08-25 MED ORDER — KETOROLAC TROMETHAMINE 30 MG/ML IJ SOLN
INTRAMUSCULAR | Status: DC | PRN
  Administered 2019-08-25: 15 mg via INTRAVENOUS

## 2019-08-25 MED ORDER — MAGNESIUM HYDROXIDE 400 MG/5ML PO SUSP: 30.0000 mL | Freq: Every day | ORAL | Status: DC | PRN

## 2019-08-25 MED ORDER — ACETAMINOPHEN 500 MG PO TABS
1000.0000 mg | ORAL_TABLET | Freq: Four times a day (QID) | ORAL | Status: AC
  Administered 2019-08-25 – 2019-08-26 (×3): 1000 mg via ORAL
  Filled 2019-08-25 (×4): qty 2

## 2019-08-25 MED ORDER — METFORMIN HCL 500 MG PO TABS
500.0000 mg | ORAL_TABLET | Freq: Every day | ORAL | Status: DC
  Administered 2019-08-27: 500 mg via ORAL
  Filled 2019-08-25: qty 1

## 2019-08-25 MED ORDER — KETOROLAC TROMETHAMINE 30 MG/ML IJ SOLN
INTRAMUSCULAR | Status: AC
  Filled 2019-08-25: qty 1

## 2019-08-25 MED ORDER — BISACODYL 10 MG RE SUPP: 10.0000 mg | Freq: Every day | RECTAL | Status: DC | PRN

## 2019-08-25 MED ORDER — DROPERIDOL 2.5 MG/ML IJ SOLN
0.6250 mg | Freq: Once | INTRAMUSCULAR | Status: DC | PRN
  Filled 2019-08-25: qty 2

## 2019-08-25 MED ORDER — GLYCOPYRROLATE 0.2 MG/ML IJ SOLN
INTRAMUSCULAR | Status: AC
  Filled 2019-08-25: qty 1

## 2019-08-25 MED ORDER — GLYCOPYRROLATE 0.2 MG/ML IJ SOLN
INTRAMUSCULAR | Status: DC | PRN
  Administered 2019-08-25: .2 mg via INTRAVENOUS

## 2019-08-25 MED ORDER — BUPIVACAINE-EPINEPHRINE (PF) 0.5% -1:200000 IJ SOLN
INTRAMUSCULAR | Status: DC | PRN
  Administered 2019-08-25: 30 mL

## 2019-08-25 MED ORDER — METOCLOPRAMIDE HCL 5 MG/ML IJ SOLN: 5.0000 mg | Freq: Three times a day (TID) | INTRAMUSCULAR | Status: DC | PRN

## 2019-08-25 MED ORDER — ONDANSETRON HCL 4 MG/2ML IJ SOLN
INTRAMUSCULAR | Status: AC
  Filled 2019-08-25: qty 2

## 2019-08-25 MED ORDER — PROMETHAZINE HCL 25 MG/ML IJ SOLN
6.2500 mg | INTRAMUSCULAR | Status: DC | PRN
  Administered 2019-08-25: 6.25 mg via INTRAVENOUS

## 2019-08-25 MED ORDER — FENTANYL CITRATE (PF) 100 MCG/2ML IJ SOLN
25.0000 ug | INTRAMUSCULAR | Status: DC | PRN
  Administered 2019-08-25: 50 ug via INTRAVENOUS

## 2019-08-25 MED ORDER — FLUTICASONE PROPIONATE 50 MCG/ACT NA SUSP
1.0000 | Freq: Two times a day (BID) | NASAL | Status: DC | PRN
  Filled 2019-08-25: qty 16

## 2019-08-25 MED ORDER — ACETAMINOPHEN 10 MG/ML IV SOLN
INTRAVENOUS | Status: AC
  Filled 2019-08-25: qty 100

## 2019-08-25 MED ORDER — GLIPIZIDE 10 MG PO TABS
10.0000 mg | ORAL_TABLET | Freq: Every day | ORAL | Status: DC
  Administered 2019-08-26 – 2019-08-29 (×4): 10 mg via ORAL
  Filled 2019-08-25 (×5): qty 1

## 2019-08-25 MED ORDER — METFORMIN HCL 500 MG PO TABS
1000.0000 mg | ORAL_TABLET | Freq: Every day | ORAL | Status: DC
  Administered 2019-08-26 – 2019-08-29 (×4): 1000 mg via ORAL
  Filled 2019-08-25 (×4): qty 2

## 2019-08-25 MED ORDER — LIDOCAINE HCL (PF) 2 % IJ SOLN
INTRAMUSCULAR | Status: AC
  Filled 2019-08-25: qty 5

## 2019-08-25 MED ORDER — FENTANYL 12 MCG/HR TD PT72: 1.0000 | MEDICATED_PATCH | TRANSDERMAL | Status: DC

## 2019-08-25 MED ORDER — FENTANYL CITRATE (PF) 100 MCG/2ML IJ SOLN
INTRAMUSCULAR | Status: AC
  Administered 2019-08-25: 25 ug via INTRAVENOUS
  Filled 2019-08-25: qty 2

## 2019-08-25 MED ORDER — FERROUS SULFATE 325 (65 FE) MG PO TABS
325.0000 mg | ORAL_TABLET | Freq: Every day | ORAL | Status: DC
  Administered 2019-08-26 – 2019-08-29 (×4): 325 mg via ORAL
  Filled 2019-08-25 (×4): qty 1

## 2019-08-25 MED ORDER — FUROSEMIDE 20 MG PO TABS
20.0000 mg | ORAL_TABLET | Freq: Every day | ORAL | Status: DC
  Administered 2019-08-26 – 2019-08-29 (×4): 20 mg via ORAL
  Filled 2019-08-25 (×4): qty 1

## 2019-08-25 MED ORDER — PHENYLEPHRINE HCL (PRESSORS) 10 MG/ML IV SOLN
INTRAVENOUS | Status: AC
  Filled 2019-08-25: qty 1

## 2019-08-25 MED ORDER — NITROGLYCERIN 0.4 MG SL SUBL: 0.4000 mg | SUBLINGUAL_TABLET | SUBLINGUAL | Status: DC | PRN

## 2019-08-25 MED ORDER — CEFAZOLIN SODIUM-DEXTROSE 2-4 GM/100ML-% IV SOLN
INTRAVENOUS | Status: AC
  Filled 2019-08-25: qty 100

## 2019-08-25 MED ORDER — DOCUSATE SODIUM 100 MG PO CAPS
100.0000 mg | ORAL_CAPSULE | Freq: Two times a day (BID) | ORAL | Status: DC
  Administered 2019-08-25 – 2019-08-29 (×8): 100 mg via ORAL
  Filled 2019-08-25 (×8): qty 1

## 2019-08-25 MED ORDER — TRAMADOL HCL 50 MG PO TABS
50.0000 mg | ORAL_TABLET | Freq: Four times a day (QID) | ORAL | Status: DC
  Administered 2019-08-25 – 2019-08-28 (×10): 50 mg via ORAL
  Filled 2019-08-25 (×11): qty 1

## 2019-08-25 MED ORDER — ACETAMINOPHEN 325 MG PO TABS: 325.0000 mg | ORAL_TABLET | ORAL | Status: DC | PRN

## 2019-08-25 MED ORDER — ENOXAPARIN SODIUM 40 MG/0.4ML ~~LOC~~ SOLN
40.0000 mg | SUBCUTANEOUS | Status: DC
  Administered 2019-08-26 – 2019-08-29 (×4): 40 mg via SUBCUTANEOUS
  Filled 2019-08-25 (×4): qty 0.4

## 2019-08-25 MED ORDER — CEFAZOLIN SODIUM-DEXTROSE 2-4 GM/100ML-% IV SOLN
2.0000 g | Freq: Four times a day (QID) | INTRAVENOUS | Status: AC
  Administered 2019-08-25 – 2019-08-26 (×3): 2 g via INTRAVENOUS
  Filled 2019-08-25 (×3): qty 100

## 2019-08-25 MED ORDER — TOPIRAMATE 25 MG PO TABS
25.0000 mg | ORAL_TABLET | Freq: Two times a day (BID) | ORAL | Status: DC
  Administered 2019-08-25 – 2019-08-29 (×8): 25 mg via ORAL
  Filled 2019-08-25 (×10): qty 1

## 2019-08-25 MED ORDER — ROCURONIUM BROMIDE 10 MG/ML (PF) SYRINGE
PREFILLED_SYRINGE | INTRAVENOUS | Status: AC
  Filled 2019-08-25: qty 10

## 2019-08-25 MED ORDER — ONDANSETRON HCL 4 MG/2ML IJ SOLN: 4.0000 mg | Freq: Four times a day (QID) | INTRAMUSCULAR | Status: DC | PRN

## 2019-08-25 SURGICAL SUPPLY — 67 items
BASEPLATE GLENOSPHERE 25 (Plate) ×2 IMPLANT
BASEPLATE GLENOSPHERE 25MM (Plate) ×1 IMPLANT
BEARING HUMERAL SHLDER 36M STD (Shoulder) ×1 IMPLANT
BIT DRILL TWIST 2.7 (BIT) ×2 IMPLANT
BIT DRILL TWIST 2.7MM (BIT) ×1
BLADE SAW SAG 25X90X1.19 (BLADE) ×3 IMPLANT
BNDG COHESIVE 4X5 TAN STRL (GAUZE/BANDAGES/DRESSINGS) ×3 IMPLANT
CANISTER SUCT 1200ML W/VALVE (MISCELLANEOUS) ×3 IMPLANT
CANISTER SUCT 3000ML PPV (MISCELLANEOUS) ×6 IMPLANT
CHLORAPREP W/TINT 26 (MISCELLANEOUS) ×3 IMPLANT
COOLER POLAR GLACIER W/PUMP (MISCELLANEOUS) ×3 IMPLANT
COVER BACK TABLE REUSABLE LG (DRAPES) ×3 IMPLANT
COVER WAND RF STERILE (DRAPES) ×3 IMPLANT
DRAPE 3/4 80X56 (DRAPES) ×6 IMPLANT
DRAPE IMP U-DRAPE 54X76 (DRAPES) ×6 IMPLANT
DRAPE INCISE IOBAN 66X45 STRL (DRAPES) ×6 IMPLANT
DRSG OPSITE POSTOP 4X8 (GAUZE/BANDAGES/DRESSINGS) ×3 IMPLANT
ELECT BLADE 6.5 EXT (BLADE) IMPLANT
ELECT CAUTERY BLADE 6.4 (BLADE) ×3 IMPLANT
GLENOID SPHERE STD STRL 36MM (Orthopedic Implant) ×3 IMPLANT
GLOVE BIO SURGEON STRL SZ7.5 (GLOVE) ×12 IMPLANT
GLOVE BIO SURGEON STRL SZ8 (GLOVE) ×12 IMPLANT
GLOVE BIOGEL PI IND STRL 8 (GLOVE) ×1 IMPLANT
GLOVE BIOGEL PI INDICATOR 8 (GLOVE) ×2
GLOVE INDICATOR 8.0 STRL GRN (GLOVE) ×3 IMPLANT
GOWN STRL REUS W/ TWL LRG LVL3 (GOWN DISPOSABLE) ×3 IMPLANT
GOWN STRL REUS W/ TWL XL LVL3 (GOWN DISPOSABLE) ×1 IMPLANT
GOWN STRL REUS W/TWL LRG LVL3 (GOWN DISPOSABLE) ×6
GOWN STRL REUS W/TWL XL LVL3 (GOWN DISPOSABLE) ×2
HOOD PEEL AWAY FLYTE STAYCOOL (MISCELLANEOUS) ×12 IMPLANT
ILLUMINATOR WAVEGUIDE N/F (MISCELLANEOUS) ×3 IMPLANT
KIT STABILIZATION SHOULDER (MISCELLANEOUS) ×3 IMPLANT
KIT TURNOVER KIT A (KITS) ×3 IMPLANT
MASK FACE SPIDER DISP (MASK) ×3 IMPLANT
MAT ABSORB  FLUID 56X50 GRAY (MISCELLANEOUS) ×2
MAT ABSORB FLUID 56X50 GRAY (MISCELLANEOUS) ×1 IMPLANT
NDL SAFETY ECLIPSE 18X1.5 (NEEDLE) ×1 IMPLANT
NEEDLE HYPO 18GX1.5 SHARP (NEEDLE) ×2
NEEDLE HYPO 22GX1.5 SAFETY (NEEDLE) ×3 IMPLANT
NEEDLE SPNL 20GX3.5 QUINCKE YW (NEEDLE) ×3 IMPLANT
NS IRRIG 500ML POUR BTL (IV SOLUTION) ×3 IMPLANT
PACK SHDR ARTHRO (MISCELLANEOUS) ×3 IMPLANT
PAD ARMBOARD 7.5X6 YLW CONV (MISCELLANEOUS) ×3 IMPLANT
PAD WRAPON POLAR SHDR UNIV (MISCELLANEOUS) ×1 IMPLANT
PENCIL SMOKE EVACUATOR (MISCELLANEOUS) ×3 IMPLANT
PULSAVAC PLUS IRRIG FAN TIP (DISPOSABLE) ×3
SCREW BONE STRL 6.5MMX25MM (Screw) ×3 IMPLANT
SCREW LOCKING STRL 4.75X25X3.5 (Screw) ×3 IMPLANT
SCREW NON-LOCK 4.75MMX15MM (Screw) ×6 IMPLANT
SHOULDER HUMERAL BEAR 36M STD (Shoulder) ×3 IMPLANT
SLING ULTRA II M (MISCELLANEOUS) ×3 IMPLANT
SOL .9 NS 3000ML IRR  AL (IV SOLUTION) ×2
SOL .9 NS 3000ML IRR UROMATIC (IV SOLUTION) ×1 IMPLANT
SPONGE LAP 18X18 RF (DISPOSABLE) ×3 IMPLANT
STAPLER SKIN PROX 35W (STAPLE) ×3 IMPLANT
STEM HUMERAL STRL 12MMX14MM (Stem) ×3 IMPLANT
SUT ETHIBOND 0 MO6 C/R (SUTURE) ×3 IMPLANT
SUT FIBERWIRE #2 38 BLUE 1/2 (SUTURE) ×12
SUT VIC AB 0 CT1 36 (SUTURE) ×3 IMPLANT
SUT VIC AB 2-0 CT1 27 (SUTURE) ×4
SUT VIC AB 2-0 CT1 TAPERPNT 27 (SUTURE) ×2 IMPLANT
SUTURE FIBERWR #2 38 BLUE 1/2 (SUTURE) ×4 IMPLANT
SYR 10ML LL (SYRINGE) ×3 IMPLANT
SYR 30ML LL (SYRINGE) IMPLANT
TIP FAN IRRIG PULSAVAC PLUS (DISPOSABLE) ×1 IMPLANT
TRAY HUM MINI SHOULDER +0 40D (Shoulder) ×3 IMPLANT
WRAPON POLAR PAD SHDR UNIV (MISCELLANEOUS) ×3

## 2019-08-25 NOTE — H&P (Signed)
History of Present Illness:  Samantha Aguirre is a 74 y.o. female who presents today for history and physical for right reverse total shoulder arthroplasty with Dr. Roland Rack on 08/25/2019. Patient has experienced 8 months of shoulder pain and weakness without any known trauma or injury. She saw Cameron Proud, PA-C who offered her a steroid injection but she declined. The patient was sent for an MRI scan and referred to Dr. Roland Rack for further evaluation and treatment. The patient describes the symptoms as marked (major pain with significant limitations) and have the quality of being aching, nagging, miserable, stabbing, tender and throbbing. The pain is localized to the lateral arm/shoulder and localized to the anterior shoulder. These symptoms are aggravated constantly, with normal daily activities and with sleeping. She has tried acetaminophen and non-steroidal anti-inflammatories (Advil) with no significant benefit. She has tried rest with no significant benefit. She has not tried any cortisone injections, but has been receiving occupational therapy with limited benefit. The patient denies any neck pain, nor does she note any numbness or paresthesias down her arm to her hand. This complaint is not work related. She is a sports non-participant.  Shoulder Surgical History:  The patient has had a rotator cuff repair and a decompression in the past.  PMH/PSH/Family History/Social History/Meds/Allergies:  I have reviewed past medical, surgical, social and family history, medications and allergies as documented in the EMR.  Current Outpatient Medications: . albuterol 90 mcg/actuation inhaler Inhale 1 inhalation into the lungs every 6 (six) hours as needed for Wheezing or Shortness of Breath 1 Inhaler 5  . aspirin 81 MG chewable tablet Take by mouth  . atorvastatin (LIPITOR) 80 MG tablet Take 1 tablet (80 mg total) by mouth once daily 90 tablet 3  . azelastine (ASTELIN) 137 mcg nasal spray Place 2 sprays into both  nostrils 2 (two) times daily 30 mL 1  . blood glucose diagnostic, drum test strip Use 2 (two) times daily Use as instructed. 200 each 2  . blood glucose meter kit Use as directed 1 each 0  . clopidogreL (PLAVIX) 75 mg tablet TAKE 1 TABLET BY MOUTH ONCE DAILY 30 tablet 2  . colestipol (COLESTID) 1 gram tablet Take 2 tablets (2 g total) by mouth 2 (two) times daily Take before meals 120 tablet 11  . cyanocobalamin (VITAMIN B12) 1000 MCG tablet TAKE 1 TABLET BY MOUTH ONCE DAILY 90 tablet 1  . DULoxetine (CYMBALTA) 60 MG DR capsule TAKE 1 CAPSULE BY MOUTH ONCE DAILY 90 capsule 1  . ferrous sulfate 325 (65 FE) MG tablet TAKE 1 TABLET BY MOUTH ONCE DAILY 90 tablet 1  . fluticasone propionate (FLONASE) 50 mcg/actuation nasal spray Place 2 sprays into both nostrils once daily 48 g 1  . FUROsemide (LASIX) 20 MG tablet TAKE 1 TABLET BY MOUTH ONCE DAILY 90 tablet 3  . gabapentin (NEURONTIN) 800 MG tablet TAKE 1 TABLET BY MOUTH TWICE DAILY 180 tablet 1  . glipiZIDE-metFORMIN (METAGLIP) 5-500 mg tablet TAKE 2 TABLETS BY MOUTH ONCE EVERY MORNING AND 1 TABLET ONCE EVERY EVENING 270 tablet 1  . hydrocortisone 2.5 % cream Use on affected area x 7 30 g 1  . lancets Use 1 each 3 (three) times daily Use as instructed. 200 each 2  . lancing device with lancets kit Use 1 each 2 (two) times daily Use as instructed. 200 each 2  . levETIRAcetam (KEPPRA) 500 MG tablet TAKE 1 TABLET BY MOUTH TWICE DAILY 180 tablet 1  . levocetirizine (XYZAL) 5 MG  tablet Take 1 tablet (5 mg total) by mouth once daily as needed for Allergies 30 tablet 0  . levothyroxine (SYNTHROID) 125 MCG tablet Take 1 tablet (125 mcg total) by mouth once daily Take on an empty stomach with a glass of water at least 30-60 minutes before breakfast. 90 tablet 1  . lisinopriL (ZESTRIL) 5 MG tablet Take 1 tablet (5 mg total) by mouth once daily 30 tablet 5  . nitroGLYcerin (NITROSTAT) 0.4 MG SL tablet Place 1 tablet (0.4 mg total) under the tongue every 5 (five)  minutes as needed for Chest pain May take up to 3 doses. 25 tablet 3  . topiramate (TOPAMAX) 25 MG tablet TAKE 1 TABLET BY MOUTH TWICE DAILY 60 tablet 5  . umeclidinium (INCRUSE ELLIPTA) 62.5 mcg/actuation DsDv inhalation unit Inhale 1 inhalation into the lungs once daily 30 each 5   No current Epic-ordered facility-administered medications on file.   Allergies:  Marland Kitchen Mushroom Anaphylaxis  Made patient "deathly sick"  . Atenolol Other (See Comments)  "MD took me off of it bc it was doing something wrong" Made patient HYPOTENSIVE "MD took me off of it bc it was doing something wrong" Made patient HYPOTENSIVE  . Oxycodone-Acetaminophen Nausea And Vomiting and Other (See Comments)  Stomach pains Stomach pains  . Oxycodone-Aspirin Nausea And Vomiting and Other (See Comments)  Stomach pains  . Tramadol Nausea  . Verapamil Other (See Comments)  " MD told me this was screwing me up" [place] " MD told me this was screwing me up" MAKES PATIENT HYPOTENSIVE  . Iodinated Contrast Media Itching  Pt injected with IV contrast. 5 min after injection pt complained of itching behind ear and on her abd.  . Morphine Other (See Comments)  "stops my breathing" NEAR-RESPIRATORY ARREST   Past Medical History:  . COPD (chronic obstructive pulmonary disease) (CMS-HCC)  . Coronary artery disease  . Diabetes mellitus type 2, uncomplicated (CMS-HCC)  . GERD (gastroesophageal reflux disease)  . Heart disease  . Hyperlipidemia  . Hypertension  . Myocardial infarction (CMS-HCC)  . Stroke (CMS-HCC)   Past Surgical History:  . ARTHROSCOPIC ROTATOR CUFF REPAIR  . CAROTID ENDARTERECTOMY  . CHOLECYSTECTOMY  . CORONARY ANGIOPLASTY  . HYSTERECTOMY  . KNEE ARTHROSCOPY  . LAPAROSCOPIC CHOLECYSTECTOMY  . TONSILLECTOMY   Family History:  . Heart disease Mother  . Heart disease Father  . High blood pressure (Hypertension) Father  . Heart disease Sister  . Heart disease Brother   Social  History   Socioeconomic History  . Marital status: Single  Spouse name: Not on file  . Number of children: Not on file  . Years of education: Not on file  . Highest education level: Not on file  Occupational History  . Not on file  Tobacco Use  . Smoking status: Current Every Day Smoker  Packs/day: 0.50  Years: 50.00  Pack years: 25.00  . Smokeless tobacco: Never Used  Vaping Use  . Vaping Use: Never used  Substance and Sexual Activity  . Alcohol use: No  . Drug use: No  . Sexual activity: Defer  Other Topics Concern  . Not on file  Social History Narrative  . Not on file   Social Determinants of Health   Financial Resource Strain:  . Difficulty of Paying Living Expenses:  Food Insecurity:  . Worried About Charity fundraiser in the Last Year:  . Arboriculturist in the Last Year:  Transportation Needs:  . Lack  of Transportation (Medical):  Marland Kitchen Lack of Transportation (Non-Medical):   Review of Systems:  A comprehensive 14 point ROS was performed, reviewed, and the pertinent orthopaedic findings are documented in the HPI.  Physical Exam:  Vitals:  08/23/19 1405  BP: 142/76  Pulse: 57  SpO2: 97%  Weight: 75.8 kg (167 lb)  Height: 154.9 cm (_0 )  PainSc: 10-Worst pain ever  PainLoc: Shoulder   General/Constitutional: The patient appears to be well-nourished, well-developed, and in no acute distress. Neuro/Psych: Normal mood and affect, oriented to person, place and time. Eyes: Non-icteric. Pupils are equal, round, and reactive to light, and exhibit synchronous movement. ENT: Unremarkable.  Abdomen: Soft nontender nondistended Lymphatic: No palpable adenopathy. Respiratory: Mildly diminished breath sounds diffusely, Lungs clear to auscultation, No wheezes and Non-labored breathing Cardiovascular: Regular rate and rhythm. No murmurs. and No edema, swelling or tenderness, except as noted in detailed exam. Integumentary: No impressive skin lesions present, except  as noted in detailed exam. Musculoskeletal: Unremarkable, except as noted in detailed exam.  Right shoulder exam: SKIN: Well-healed surgical incision, otherwise unremarkable SWELLING: none WARMTH: none LYMPH NODES: no adenopathy palpable CREPITUS: none TENDERNESS: Mildly tender over anterolateral shoulder ROM (active):  Forward flexion: 95 degrees Abduction: 90 degrees Internal rotation: Right iliac crest ROM (passive):  Forward flexion: 135 degrees Abduction: 125 degrees  ER/IR at 90 abd: Not evaluated  She has moderate pain with all motions.  STRENGTH: Forward flexion: 3/5 Abduction: 3/5 External rotation: 3+/5 Internal rotation: 4-4+/5 Pain with RC testing: Moderate pain with resisted forward flexion and abduction, mild pain with resisted external rotation  STABILITY: Normal  SPECIAL TESTS: Luan Pulling' test: positive, moderate Speed's test: negative Capsulitis - pain w/ passive ER: no Crossed arm test: Mildly positive Crank: Not evaluated Anterior apprehension: Negative Posterior apprehension: Not evaluated  She is neurovascularly intact to the right upper extremity.  Imaging:  Shoulder Imaging, MRI: Right Shoulder: MRI Shoulder Cartilage: Partial thickness humeral head cartilage loss. Partial thickness glenoid cartilage loss. MRI Shoulder Rotator Cuff: Full thickness tear of the supraspinatus. Retracted to the humeral head. Severe atrophy of supraspinatus muscle. MRI Shoulder Labrum / Biceps: Biceps tendinopathy. MRI Shoulder Bone: Normal bone.  Assessment:  . Rotator cuff arthropathy, right.  . Nontraumatic complete tear of right rotator cuff. . Biceps tendonitis on right.   Plan:  The patient is a 74 year old female with rotator cuff arthropathy. Patient has had significant debilitation secondary to pain and weakness with her right shoulder. She would like to proceed with surgical intervention. She has seen Dr. Roland Rack, discussed the surgery as well as reviewed  imaging and discussed postoperative protocol. Risks, benefits, complications of a right reverse total shoulder arthroplasty have been discussed with the patient. Patient has agreed and consented to the procedure with Dr. Roland Rack on 08/25/2019.   H&P reviewed and patient re-examined. No changes.

## 2019-08-25 NOTE — Anesthesia Procedure Notes (Signed)
Procedure Name: Intubation Date/Time: 08/25/2019 12:38 PM Performed by: Justus Memory, CRNA Pre-anesthesia Checklist: Patient identified, Patient being monitored, Timeout performed, Emergency Drugs available and Suction available Patient Re-evaluated:Patient Re-evaluated prior to induction Oxygen Delivery Method: Circle system utilized Preoxygenation: Pre-oxygenation with 100% oxygen Induction Type: IV induction Ventilation: Mask ventilation with difficulty and Oral airway inserted - appropriate to patient size Laryngoscope Size: 3 and McGraph Grade View: Grade I Tube type: Oral Tube size: 7.0 mm Number of attempts: 1 Airway Equipment and Method: Stylet and Video-laryngoscopy Placement Confirmation: ETT inserted through vocal cords under direct vision,  positive ETCO2 and breath sounds checked- equal and bilateral Secured at: 21 cm Tube secured with: Tape Dental Injury: Teeth and Oropharynx as per pre-operative assessment  Difficulty Due To: Difficult Airway- due to large tongue and Difficult Airway- due to anterior larynx Future Recommendations: Recommend- induction with short-acting agent, and alternative techniques readily available

## 2019-08-25 NOTE — Transfer of Care (Signed)
Immediate Anesthesia Transfer of Care Note  Patient: Samantha Aguirre  Procedure(s) Performed: REVERSE SHOULDER ARTHROPLASTY (Right Shoulder)  Patient Location: PACU  Anesthesia Type:General  Level of Consciousness: awake  Airway & Oxygen Therapy: Patient Spontanous Breathing and Patient connected to face mask oxygen  Post-op Assessment: Report given to RN and Post -op Vital signs reviewed and stable  Post vital signs: Reviewed  Last Vitals:  Vitals Value Taken Time  BP 155/80 08/25/19 1501  Temp    Pulse 70 08/25/19 1502  Resp 22 08/25/19 1502  SpO2 99 % 08/25/19 1502  Vitals shown include unvalidated device data.  Last Pain:  Vitals:   08/25/19 0959  TempSrc: Tympanic  PainSc: 8          Complications: No complications documented.

## 2019-08-25 NOTE — Anesthesia Preprocedure Evaluation (Signed)
Anesthesia Evaluation  Patient identified by MRN, date of birth, ID band Patient awake    Reviewed: Allergy & Precautions, H&P , NPO status , reviewed documented beta blocker date and time   Airway Mallampati: II  TM Distance: >3 FB Neck ROM: limited    Dental  (+) Edentulous Upper, Edentulous Lower   Pulmonary asthma , COPD,  COPD inhaler, Current Smoker and Patient abstained from smoking.,  Uses inhaler approx 2-3x week, can walk up 1/2 flight of stairs without SOB   breath sounds clear to auscultation       Cardiovascular hypertension, + CAD and +CHF  Normal cardiovascular exam  03/2018 ECHO IMPRESSIONS    1. The left ventricle has low normal systolic function, with an ejection  fraction of 50-55%. The cavity size was normal. Left ventricular diastolic  parameters were normal.  2. The right ventricle has normal systolic function. The cavity was  normal. There is no increase in right ventricular wall thickness.  3. The mitral valve is normal in structure.  4. The tricuspid valve is normal in structure.  5. The aortic valve is normal in structure.  6. The pulmonic valve was normal in structure.    Neuro/Psych  Headaches, Seizures -,  PSYCHIATRIC DISORDERS Depression TIACVA    GI/Hepatic GERD  Controlled,  Endo/Other  diabetesHypothyroidism   Renal/GU      Musculoskeletal  (+) Arthritis ,   Abdominal   Peds  Hematology  (+) Blood dyscrasia, anemia ,   Anesthesia Other Findings Past Medical History: No date: Acid reflux No date: Arthritis     Comment:  Bilateral knees, hands, feet No date: Asthma No date: Atrial fibrillation (HCC) No date: Cancer (HCC) No date: CHF (congestive heart failure) (HCC)     Comment:  Diastolic CHF No date: COPD (chronic obstructive pulmonary disease) (HCC) No date: Coronary artery disease No date: Diabetes mellitus without complication (HCC) No date: Frequent headaches No  date: GI bleed No date: HLD (hyperlipidemia) No date: Hypertension No date: Seizures ([place]) 2015: Skin cancer     Comment:  Suspected basal cell carcinoma on nose, surgically               removed 04/2017: Stroke ([place]) No date: Thyroid disease No date: Tremors of nervous system Past Surgical History: 1976: ABDOMINAL HYSTERECTOMY 1980: APPENDECTOMY No date: BACK SURGERY No date: cardiac stents No date: CAROTID ENDARTERECTOMY 1980: CHOLECYSTECTOMY 02/20/2017: COLONOSCOPY; N/A     Comment:  Procedure: COLONOSCOPY;  Surgeon: Lin Landsman,               MD;  Location: [facility] ENDOSCOPY;  Service:               Gastroenterology;  Laterality: N/A; 02/20/2017: ESOPHAGOGASTRODUODENOSCOPY; N/A     Comment:  Procedure: ESOPHAGOGASTRODUODENOSCOPY (EGD);  Surgeon:               Lin Landsman, MD;  Location: [facility] ENDOSCOPY;                Service: Gastroenterology;  Laterality: N/A; 07/08/2017: ESOPHAGOGASTRODUODENOSCOPY (EGD) WITH PROPOFOL; N/A     Comment:  Procedure: ESOPHAGOGASTRODUODENOSCOPY (EGD) WITH               PROPOFOL;  Surgeon: [name], MD;  Location: [facility]               ENDOSCOPY;  Service: Endoscopy;  Laterality: N/A; 1980: GALLBLADDER SURGERY 07/09/2017: GIVENS CAPSULE STUDY; N/A     Comment:  Procedure: GIVENS  CAPSULE STUDY;  Surgeon: Jonathon Bellows,               MD;  Location: [facility] ENDOSCOPY;  Service:               Gastroenterology;  Laterality: N/A; No date: KNEE SURGERY     Comment:  left 03/16/2018: KYPHOPLASTY; N/A     Comment:  Procedure: KYPHOPLASTY, L5;  Surgeon: Hessie Knows, MD;              Location: [facility] ORS;  Service: Orthopedics;  Laterality:               N/A; No date: ROTATOR CUFF REPAIR     Comment:  right 1950: TONSILLECTOMY   Reproductive/Obstetrics                             Anesthesia Physical Anesthesia Plan  ASA: III  Anesthesia Plan: General   Post-op Pain Management:    Induction: Intravenous  PONV Risk  Score and Plan: 3 and Ondansetron, Dexamethasone, Midazolam, Treatment may vary due to age or medical condition and Diphenhydramine  Airway Management Planned: Oral ETT  Additional Equipment:   Intra-op Plan:   Post-operative Plan: Extubation in OR  Informed Consent: I have reviewed the patients History and Physical, chart, labs and discussed the procedure including the risks, benefits and alternatives for the proposed anesthesia with the patient or authorized representative who has indicated his/her understanding and acceptance.     Dental Advisory Given  Plan Discussed with: CRNA  Anesthesia Plan Comments:         Anesthesia Quick Evaluation

## 2019-08-25 NOTE — Op Note (Signed)
08/25/2019  2:41 PM  Patient:   Samantha Aguirre  Pre-Op Diagnosis:   Rotator cuff arthropathy with recurrent rotator cuff tear, right shoulder.  Post-Op Diagnosis:   Same  Procedure:   Reverse right total shoulder arthroplasty.  Surgeon:   Pascal Lux, MD  Assistant:   Cameron Proud, PA-C; Gabrielle Dare, PA-S  Anesthesia:   GET  Findings:   As above.  Complications:   None  EBL:    200 cc  Fluids:   1000 cc crystalloid  UOP:   None  TT:   None  Drains:   None  Closure:   Staples  Implants:   All press-fit Bi[employer] Comprehensive system with a #12 micro-humeral stem, a 40 mm humeral tray with a standard insert, and a mini-base plate with a 36 mm glenosphere.  Brief Clinical Note:   The patient is a 74 year old female with a long history of progressively worsening right shoulder pain and weakness. The patient had undergone a mini open rotator cuff repair of the right shoulder many years ago. Over the past 8 months, her symptoms have worsened despite medications, activity modification, etc. Her history and examination are consistent with a recurrent rotator cuff tear with cuff arthropathy, all of which were confirmed by MRI scan. The patient presents at this time for a reverse right total shoulder arthroplasty.  Procedure:   The patient was brought into the operating room and lain in the supine position. The patient then underwent general endotracheal intubation and anesthesia before the patient was repositioned in the beach chair position using the beach chair positioner. The right shoulder and upper extremity were prepped with ChloraPrep solution before being draped sterilely. Preoperative antibiotics were administered.   A standard anterior approach to the shoulder was made through an approximately 4-5 inch incision. The incision was carried down through the subcutaneous tissues to expose the deltopectoral fascia. The interval between the deltoid and pectoralis muscles  was identified and this plane developed, retracting the cephalic vein laterally with the deltoid muscle. The conjoined tendon was identified. Its lateral margin was dissected and the Kolbel self-retraining retractor inserted. The "three sisters" were identified and cauterized. Bursal tissues were removed to improve visualization. The subscapularis tendon was released from its attachment to the lesser tuberosity 1 cm proximal to its insertion and several tagging sutures placed. The inferior capsule was released with care after identifying and protecting the axillary nerve. The proximal humeral cut was made at approximately 25 of retroversion using the extra-medullary guide.   Attention was redirected to the glenoid. The labrum was debrided circumferentially before the center of the glenoid was marked with electrocautery. The guidewire was drilled into the glenoid neck using the appropriate guide. After verifying its position, it was overreamed with the mini-baseplate reamer to create a flat surface. The permanent mini-baseplate was impacted into place. It was stabilized with a 25 x 6.5 mm central screw and four peripheral screws. Locking screws were placed superiorly and inferiorly while nonlocking screws were placed anteriorly and posteriorly. The permanent 36 mm glenosphere was then impacted into place and its Morse taper locking mechanism verified using manual distraction.  Attention was directed to the humeral side. The humeral canal was reamed sequentially beginning with the end-cutting reamer then progressing from a 4 mm reamer up to a 12 mm reamer. This provided excellent circumferential chatter. The canal was broached beginning with a #9 broach and progressing to a #12 broach. This was left in place and a trial reduction performed using  the standard trial humeral platform. The arm demonstrated excellent range of motion as the hand could be brought across the chest to the opposite shoulder and brought to  the top of the patient's head and to the patient's ear. The shoulder appeared stable throughout this range of motion. The joint was dislocated and the trial components removed. The permanent #12 micro-stem was impacted into place with care taken to maintain the appropriate version. The permanent 40 mm humeral platform with the standard insert was put together on the back table and impacted into place. Again, the [facility] taper locking mechanism was verified using manual distraction. The shoulder was relocated using two finger pressure and again placed through a range of motion with the findings as described above.  The wound was copiously irrigated with sterile saline solution using the jet lavage system before a total of 20 cc of Exparel diluted out to 40 cc with normal saline and 30 cc of 0.5% Sensorcaine with epinephrine was injected into the pericapsular and peri-incisional tissues to help with postoperative analgesia. The subscapularis tendon was reapproximated using #2 FiberWire interrupted sutures. The deltopectoral interval was closed using #0 Vicryl interrupted sutures before the subcutaneous tissues were closed using 2-0 Vicryl interrupted sutures. The skin was closed using staples. Prior to closing the skin, 1 g of transexemic acid in 10 cc of normal saline was injected intra-articularly to help with postoperative bleeding. A sterile occlusive dressing was applied to the wound before the arm was placed into a shoulder immobilizer with an abduction pillow. A Polar Care system also was applied to the shoulder. The patient was then transferred back to a hospital bed before being awakened, extubated, and returned to the recovery room in satisfactory condition after tolerating the procedure well.

## 2019-08-26 ENCOUNTER — Encounter: Payer: Self-pay | Admitting: Surgery

## 2019-08-26 LAB — CBC
HCT: 35.6 % — ABNORMAL LOW (ref 36.0–46.0)
Hemoglobin: 11.5 g/dL — ABNORMAL LOW (ref 12.0–15.0)
MCH: 29.9 pg (ref 26.0–34.0)
MCHC: 32.3 g/dL (ref 30.0–36.0)
MCV: 92.5 fL (ref 80.0–100.0)
Platelets: 231 10*3/uL (ref 150–400)
RBC: 3.85 MIL/uL — ABNORMAL LOW (ref 3.87–5.11)
RDW: 14.5 % (ref 11.5–15.5)
WBC: 12.4 10*3/uL — ABNORMAL HIGH (ref 4.0–10.5)
nRBC: 0 % (ref 0.0–0.2)

## 2019-08-26 LAB — GLUCOSE, CAPILLARY
Glucose-Capillary: 145 mg/dL — ABNORMAL HIGH (ref 70–99)
Glucose-Capillary: 99 mg/dL (ref 70–99)
Glucose-Capillary: 69 mg/dL — ABNORMAL LOW (ref 70–99)
Glucose-Capillary: 154 mg/dL — ABNORMAL HIGH (ref 70–99)

## 2019-08-26 LAB — BASIC METABOLIC PANEL
Anion gap: 10 (ref 5–15)
BUN: 9 mg/dL (ref 8–23)
CO2: 23 mmol/L (ref 22–32)
Calcium: 8.3 mg/dL — ABNORMAL LOW (ref 8.9–10.3)
Chloride: 108 mmol/L (ref 98–111)
Creatinine, Ser: 0.63 mg/dL (ref 0.44–1.00)
GFR calc Af Amer: >60 mL/min (ref >60)
GFR calc non Af Amer: >60 mL/min (ref >60)
Glucose, Bld: 155 mg/dL — ABNORMAL HIGH (ref 70–99)
Potassium: 3.3 mmol/L — ABNORMAL LOW (ref 3.5–5.1)
Sodium: 141 mmol/L (ref 135–145)

## 2019-08-26 LAB — HEMOGLOBIN A1C
Hgb A1c MFr Bld: 6.3 % — ABNORMAL HIGH (ref 4.8–5.6)
Mean Plasma Glucose: 134.11 mg/dL

## 2019-08-26 MED ORDER — POTASSIUM CHLORIDE 20 MEQ PO PACK
40.0000 meq | PACK | Freq: Two times a day (BID) | ORAL | Status: DC
  Administered 2019-08-26 – 2019-08-28 (×6): 40 meq via ORAL
  Filled 2019-08-26 (×9): qty 2

## 2019-08-26 NOTE — Progress Notes (Addendum)
Physical Therapy Treatment Patient Details Name: Samantha Aguirre MRN: [id number] DOB: 21-Feb-1945 Today's Date: 08/26/2019    History of Present Illness 74 year old female with a long history of progressively worsening right shoulder pain and weakness. S/p reverse right total shoulder arthroplasty on 08/25/19.    PT Comments    With heavy reliance on rails pt was able to get in/out of bed with only minimal cuing and extra time.  She showed good confidence with getting to standing using SPC and though she did use it UE reliance was not excessive.  Pt's O2 remained in the 90s on room air with prolonged ambulation.  Pt did have some stagger stepping during the effort, but more seemingly related to directional cuing and some mild confusion.  Despite regular cuing for awareness of sling/wide berth on R she had multiple times brushing R side on obstacles.  Pt with no overt balance issues, but showed poor general safety awareness and did need consistent cuing and reinforcement.  Follow Up Recommendations  Follow surgeon's recommendation for DC plan and follow-up therapies;Supervision/Assistance - 24 hour     Equipment Recommendations   (quad cane)    Recommendations for Other Services       Precautions / Restrictions Precautions Precautions: Shoulder;Fall Type of Shoulder Precautions: rev total shoulder Required Braces or Orthoses: Sling Restrictions RUE Weight Bearing: Non weight bearing    Mobility  Bed Mobility Overal bed mobility: Modified Independent;Needs Assistance Bed Mobility: Supine to Sit;Sit to Supine     Supine to sit: Supervision;HOB elevated Sit to supine: Min guard   General bed mobility comments: heavy use of L rail in and out of bed, but able to transition w/o phyiscal assist  Transfers Overall transfer level: Modified independent Equipment used: Straight cane Transfers: Sit to/from Stand Sit to Stand: Min guard         General transfer comment: cues to  insure appropriate L UE use and sequencing, able to rise/sit w/o direct assist  Ambulation/Gait Ambulation/Gait assistance: Min guard Gait Distance (Feet): 180 Feet Assistive device: Straight cane       General Gait Details: Pt again with surprisingly confident ambulation with minimal relative reliance on AD.  She did have a few stagger steps seemingly related more to directional cuing   Stairs             Wheelchair Mobility    Modified Rankin (Stroke Patients Only)       Balance Overall balance assessment: Needs assistance Sitting-balance support: No upper extremity supported;Feet supported Sitting balance-Leahy Scale: Fair     Standing balance support: Single extremity supported Standing balance-Leahy Scale: Fair Standing balance comment: occasional mild staggering but no LOBs or need for direct assist                            Cognition Arousal/Alertness: Awake/alert Behavior During Therapy: WFL for tasks assessed/performed Overall Cognitive Status: Within Functional Limits for tasks assessed                                        Exercises  discussed typical course of recovery/rehab and expectations moving forward.  Answered many questions about home vs rehab and expectations going forward.  Pt tends to try and use R UE more than she ought and PT again and again reinforced to insure that she lets R should be passive and  let herself heal from surgery.      General Comments General comments (skin integrity, edema, etc.): high 90s on room air, removed O2 and sats remained in the mid 90s and even after prolonged bout of ambulation she was >90%.      Pertinent Vitals/Pain Pain Assessment: 0-10 Pain Score: 8  (reports pain never drops below 5/10) Pain Location: R shoulder    Home Living Family/patient expects to be discharged to:: Private residence Living Arrangements: Children (dtr and 2 grand dtrs) Available Help at Discharge:  Family;Available PRN/intermittently (initially 24/7) Type of Home: Mobile home Home Access: Stairs to enter Entrance Stairs-Rails: Can reach both Home Layout: One level Home Equipment: Walker - 2 wheels;Wheelchair - power;Cane - quad      Prior Function Level of Independence: Independent with assistive device(s)      Comments: Pt reports she is not out of the home much, but uses a RW much of the time   PT Goals (current goals can now be found in the care plan section) Acute Rehab PT Goals Patient Stated Goal: To increase independence  PT Goal Formulation: With patient Time For Goal Achievement: 09/09/19 Potential to Achieve Goals: Good Progress towards PT goals: Progressing toward goals    Frequency    Min 2X/week      PT Plan Current plan remains appropriate    Co-evaluation              AM-PAC PT "6 Clicks" Mobility   Outcome Measure  Help needed turning from your back to your side while in a flat bed without using bedrails?: A Lot Help needed moving from lying on your back to sitting on the side of a flat bed without using bedrails?: A Lot Help needed moving to and from a bed to a chair (including a wheelchair)?: A Little Help needed standing up from a chair using your arms (e.g., wheelchair or bedside chair)?: A Little Help needed to walk in hospital room?: A Little Help needed climbing 3-5 steps with a railing? : A Lot 6 Click Score: 15    End of Session Equipment Utilized During Treatment: Gait belt Activity Tolerance: Patient tolerated treatment well Patient left: with call bell/phone within reach;with chair alarm set;with nursing/sitter in room Nurse Communication: Mobility status PT Visit Diagnosis: Muscle weakness (generalized) (M62.81);Difficulty in walking, not elsewhere classified (R26.2)     Time: 1540-0867 PT Time Calculation (min) (ACUTE ONLY): 25 min  Charges:  $Gait Training: 8-22 mins $Therapeutic Activity: 8-22 mins                      Kreg Shropshire, DPT 08/26/2019, 4:31 PM

## 2019-08-26 NOTE — Evaluation (Signed)
Physical Therapy Evaluation Patient Details Name: Samantha Aguirre MRN: [id number] DOB: [birth year] Today's Date: 08/26/2019   History of Present Illness  74 year old female with a long history of progressively worsening right shoulder pain and weakness. S/p reverse right total shoulder arthroplasty on 08/25/19.  Clinical Impression  Pt was up in recliner on arrival so we did not do bed mobility, however she did quite well with ambulation using QC. Eventually she started feeling poorly/nauseated and needed to stop and actual had a small amount of vomit.  She reports she uses a RW at baseline, but actually did surprisingly well only single UE with QC.  Further session deferred secondary to nausea, will attempt to see again this afternoon.     Follow Up Recommendations Follow surgeon's recommendation for DC plan and follow-up therapies;Supervision/Assistance - 24 hour    Equipment Recommendations   (quad cane)    Recommendations for Other Services       Precautions / Restrictions Precautions Precautions: Shoulder;Fall Type of Shoulder Precautions: rev total shoulder Required Braces or Orthoses: Sling Restrictions RUE Weight Bearing: Non weight bearing      Mobility  Bed Mobility               General bed mobility comments: in recliner on arrival, not tested   Transfers Overall transfer level: Needs assistance Equipment used: Quad cane Transfers: Sit to/from Stand Sit to Stand: Min guard         General transfer comment: cues to insure appropriate L UE use and sequencing, able to rise w/o direct assist  Ambulation/Gait Ambulation/Gait assistance: Min guard Gait Distance (Feet): 65 Feet Assistive device: Quad cane       General Gait Details: Pt was able to maintain relatively consistent cadence without excessive reliance on AD.  She had some fatigue but ultimately we stopped secondary to nausea and when we got back to sitting but did have bout of heaving and very  small vomiting  Stairs            Wheelchair Mobility    Modified Rankin (Stroke Patients Only)       Balance Overall balance assessment: Needs assistance Sitting-balance support: No upper extremity supported;Feet supported Sitting balance-Leahy Scale: Fair     Standing balance support: Single extremity supported Standing balance-Leahy Scale: Fair Standing balance comment: occasional mild staggering but no LOBs or need for direct assist                             Pertinent Vitals/Pain Pain Assessment: 0-10 Pain Score: 8  Pain Location: R shoulder    Home Living Family/patient expects to be discharged to:: Private residence Living Arrangements: Children (dtr and 2 grand dtrs) Available Help at Discharge: Family;Available PRN/intermittently (initially 24/7) Type of Home: Mobile home Home Access: Stairs to enter Entrance Stairs-Rails: Can reach both Entrance Stairs-Number of Steps: 3 Home Layout: One level Home Equipment: Walker - 2 wheels;Wheelchair - power;Cane - quad      Prior Function Level of Independence: Independent with assistive device(s)         Comments: Pt reports she is not out of the home much, but uses a RW much of the time     Hand Dominance   Dominant Hand: Right    Extremity/Trunk Assessment   Upper Extremity Assessment Upper Extremity Assessment: Generalized weakness RUE:  (in sling) LUE Deficits / Details: functional in available range    Lower Extremity Assessment Lower Extremity Assessment:  Generalized weakness       Communication   Communication: No difficulties  Cognition Arousal/Alertness: Awake/alert Behavior During Therapy: WFL for tasks assessed/performed Overall Cognitive Status: Within Functional Limits for tasks assessed                                        General Comments General comments (skin integrity, edema, etc.): high 90s on room air, removed O2 and sats remained in the  mid 90s and even after prolonged bout of ambulation she was >90%.    Exercises     Assessment/Plan    PT Assessment Patient needs continued PT services  PT Problem List Decreased strength;Decreased range of motion;Decreased activity tolerance;Decreased balance;Decreased mobility;Decreased coordination;Decreased knowledge of use of DME;Decreased safety awareness;Decreased knowledge of precautions;Pain       PT Treatment Interventions Gait training;DME instruction;Stair training;Functional mobility training;Therapeutic activities;Therapeutic exercise;Balance training;Neuromuscular re-education;Patient/family education    PT Goals (Current goals can be found in the Care Plan section)  Acute Rehab PT Goals Patient Stated Goal: To increase independence  PT Goal Formulation: With patient Time For Goal Achievement: 09/09/19 Potential to Achieve Goals: Good    Frequency Min 2X/week   Barriers to discharge        Co-evaluation               AM-PAC PT "6 Clicks" Mobility  Outcome Measure Help needed turning from your back to your side while in a flat bed without using bedrails?: A Lot Help needed moving from lying on your back to sitting on the side of a flat bed without using bedrails?: A Lot Help needed moving to and from a bed to a chair (including a wheelchair)?: A Little Help needed standing up from a chair using your arms (e.g., wheelchair or bedside chair)?: A Little Help needed to walk in hospital room?: A Little Help needed climbing 3-5 steps with a railing? : A Lot 6 Click Score: 15    End of Session Equipment Utilized During Treatment: Gait belt Activity Tolerance: Patient tolerated treatment well Patient left: with call bell/phone within reach;with chair alarm set;with nursing/sitter in room Nurse Communication: Mobility status PT Visit Diagnosis: Muscle weakness (generalized) (M62.81);Difficulty in walking, not elsewhere classified (R26.2)    Time:  3244-0102 PT Time Calculation (min) (ACUTE ONLY): 26 min   Charges:   PT Evaluation $PT Eval Low Complexity: 1 Low PT Treatments $Gait Training: 8-22 mins        Kreg Shropshire, DPT 08/26/2019, 2:20 PM

## 2019-08-26 NOTE — Anesthesia Postprocedure Evaluation (Signed)
Anesthesia Post Note  Patient: Samantha Aguirre  Procedure(s) Performed: REVERSE SHOULDER ARTHROPLASTY (Right Shoulder)  Patient location during evaluation: PACU Anesthesia Type: General Level of consciousness: awake and alert Pain management: pain level controlled Vital Signs Assessment: post-procedure vital signs reviewed and stable Respiratory status: spontaneous breathing, nonlabored ventilation and respiratory function stable Cardiovascular status: blood pressure returned to baseline and stable Postop Assessment: no apparent nausea or vomiting Anesthetic complications: no   No complications documented.   Last Vitals:  Vitals:   08/26/19 0804 08/26/19 1125  BP: (!) 119/96 (!) 136/53  Pulse: 74 62  Resp: 16 16  Temp: (!) 36.3 C (!) 36.4 C  SpO2: 100% 100%    Last Pain:  Vitals:   08/26/19 1125  TempSrc: Oral  PainSc:                  Alphonsus Sias

## 2019-08-26 NOTE — Evaluation (Signed)
Occupational Therapy Evaluation Patient Details Name: Samantha Aguirre MRN: [id number] DOB: [birth year] Today's Date: 08/26/2019    History of Present Illness Samantha Aguirre is a 74 year old female with a long history of progressively worsening right shoulder pain and weakness. S/p reverse right total shoulder arthroplasty on 08/25/19.   Clinical Impression   Samantha Aguirre was seen for an OT evaluation this date. Pt lives c daughter and teenage grand daughters who are available PRN. Prior to surgery, pt reports using power w/c and RW for mobility - conflicting reports on which is preferred method, MOD I for ADLs. Pt presents to acute OT demonstrating impaired ADL performance and functional mobility 2/2 decreased safety awareness, functional strength/ROM/balance deficits, decreased activity tolerance,  And impaired use of dominant RUE. Pt currently requires MAX A don/doff sling seated EOB. MIN A wash RUE seated EOC - assist to maintain shoulder pcns. SETUP + VCs self-feeding seated in chair. MAX A doff B socks seated EOC. Attempted sit<>stand c QC in non-dominant LUE, initially required CGA + QC however posterior LOB noted after ~1 min standing at EOB - able to self-correct by sitting c adequate eccentric control. Improved balance c HHA for SPT bed>chair.   Pt instructed in polar care mgt, compression stockings mgt, sling/immobilizer mgt, functional application of RUE NWBing precautions, adaptive strategies for bathing/dressing/toileting/grooming, positioning and considerations for sleep, and home/routines modifications. Handout provided. OT adjusted sling/immobilizer and polar care to improve comfort, optimize positioning, and to maximize skin integrity/safety. Pt verbalized understanding of all education/training provided. Pt will benefit from skilled OT services to address these limitations and improve independence in daily tasks. Recommend STR to maximize pt safety and return to PLOF.       Follow  Up Recommendations  SNF    Equipment Recommendations   (TBD)    Recommendations for Other Services       Precautions / Restrictions Precautions Precautions: Shoulder;Fall Shoulder Interventions: Shoulder sling/immobilizer;Shoulder abduction pillow;Off for dressing/bathing/exercises Required Braces or Orthoses: Sling Restrictions Weight Bearing Restrictions: Yes RUE Weight Bearing: Non weight bearing      Mobility Bed Mobility Overal bed mobility: Modified Independent;Needs Assistance Bed Mobility: Supine to Sit     Supine to sit: Supervision;HOB elevated     General bed mobility comments: VCs to maintain RUE NWBing pcns  Transfers Overall transfer level: Needs assistance Equipment used: Quad cane;1 person hand held assist Transfers: Sit to/from Stand Sit to Stand: Min guard   General transfer comment: QC and 1 HHA trialed - improved balance c HHA.     Balance Overall balance assessment: Needs assistance Sitting-balance support: No upper extremity supported;Feet supported Sitting balance-Leahy Scale: Fair     Standing balance support: Single extremity supported Standing balance-Leahy Scale: Poor Standing balance comment: 1 posterior LOB able to self correct        ADL either performed or assessed with clinical judgement   ADL Overall ADL's : Needs assistance/impaired     General ADL Comments: MAX A don/doff sling seated EOB. MIN A wash RUE seated EOC - assist to maintain shoulder pcns. SETUP + VCs self-feeding seated in chair. MIN A + QC for ADL t/f. MAX A doff B socks seated EOC     Vision Baseline Vision/History: Wears glasses Wears Glasses: Reading only (reports needs distance glasses)        Pertinent Vitals/Pain Pain Assessment: 0-10 Pain Score: 10-Worst pain ever Pain Location: R shoulder Pain Descriptors / Indicators: Discomfort;Dull;Grimacing Pain Intervention(s): Limited activity within patient's tolerance;Repositioned;Patient requesting  pain meds-RN notified;Ice applied  Hand Dominance Right   Extremity/Trunk Assessment Upper Extremity Assessment Upper Extremity Assessment: Generalized weakness;RUE deficits/detail;LUE deficits/detail RUE: Unable to fully assess due to pain;Unable to fully assess due to immobilization LUE Deficits / Details: Tremor noted   Lower Extremity Assessment Lower Extremity Assessment: Generalized weakness       Communication Communication Communication: No difficulties   Cognition Arousal/Alertness: Awake/alert Behavior During Therapy: WFL for tasks assessed/performed Overall Cognitive Status: Within Functional Limits for tasks assessed   General Comments: Follows 1 step commands c repetition. Poor short term memory    General Comments  95% on 2L [place] at rest. 92% on RA following in room mobility - resolved c return to 2L [place]     Exercises Exercises: Other exercises Other Exercises Other Exercises: Pt educated re: OT role, DME recs, d/c recs, polar care mgmt, sling mgmt, functional application of NWBing pcns Other Exercises: Don/doff sling, UB washing/dressing, LBD, sup>sit, sit<>stand x2, sitting/standing balance/tolerance, SPT   Shoulder Instructions      Home Living Family/patient expects to be discharged to:: Private residence Living Arrangements: Children (daughter and 2 teenage granddaughters) Available Help at Discharge: Family;Available PRN/intermittently Type of Home: Mobile home Home Access: Stairs to enter Entrance Stairs-Number of Steps: 3 Entrance Stairs-Rails: Can reach both Home Layout: One level     Bathroom Shower/Tub: Teacher, early years/pre: Standard     Home Equipment: Environmental consultant - 2 wheels;Wheelchair - power;Cane - quad          Prior Functioning/Environment Level of Independence: Independent with assistive device(s)        Comments: Pt presents conflicting reports stating she uses power w/c primarily in home then states uses RW all the  time. Daughter assist IADLs        OT Problem List: Decreased strength;Decreased range of motion;Decreased activity tolerance;Impaired balance (sitting and/or standing);Decreased safety awareness;Decreased knowledge of use of DME or AE;Impaired UE functional use      OT Treatment/Interventions: Self-care/ADL training;Therapeutic exercise;DME and/or AE instruction;Energy conservation;Therapeutic activities;Patient/family education;Balance training    OT Goals(Current goals can be found in the care plan section) Acute Rehab OT Goals Patient Stated Goal: To increase independence  OT Goal Formulation: With patient Time For Goal Achievement: 09/09/19 Potential to Achieve Goals: Good ADL Goals Pt Will Perform Grooming: with modified independence;sitting Pt Will Transfer to Toilet: with supervision;stand pivot transfer;bedside commode (c LRAD PRN)  OT Frequency: Min 1X/week   Barriers to D/C: Inaccessible home environment;Decreased caregiver support          Co-evaluation              AM-PAC OT "6 Clicks" Daily Activity     Outcome Measure Help from another person eating meals?: None Help from another person taking care of personal grooming?: A Little Help from another person toileting, which includes using toliet, bedpan, or urinal?: A Lot Help from another person bathing (including washing, rinsing, drying)?: A Lot Help from another person to put on and taking off regular upper body clothing?: A Lot Help from another person to put on and taking off regular lower body clothing?: A Lot 6 Click Score: 15   End of Session Equipment Utilized During Treatment: Oxygen;Other (comment) (QC) Nurse Communication: Patient requests pain meds  Activity Tolerance: Patient tolerated treatment well Patient left: in chair;with call bell/phone within reach;with chair alarm set  OT Visit Diagnosis: Other abnormalities of gait and mobility (R26.89);Unsteadiness on feet (R26.81)  Time: 9980-6999 OT Time Calculation (min): 42 min Charges:  OT General Charges $OT Visit: 1 Visit OT Evaluation $OT Eval Moderate Complexity: 1 Mod OT Treatments $Self Care/Home Management : 23-37 mins  Dessie Coma, M.S. OTR/L  08/26/19, 10:23 AM  ascom [phone]

## 2019-08-26 NOTE — Progress Notes (Signed)
  Subjective: 1 Day Post-Op Procedure(s) (LRB): REVERSE SHOULDER ARTHROPLASTY (Right) Patient reports pain as 5 on 0-10 scale.   Patient is well, and has had no acute complaints or problems PT and care management to assist with discharge planning. Patient would like to go to SNF as she does not have any full time support at home. Negative for chest pain and shortness of breath Fever: no Gastrointestinal:Negative for nausea and vomiting  Objective: Vital signs in last 24 hours: Temp:  [96.5 F (35.8 C)-97.6 F (36.4 C)] 97.5 F (36.4 C) (08/13 1125) Pulse Rate:  [48-74] 62 (08/13 1125) Resp:  [16-25] 16 (08/13 1125) BP: (119-156)/(53-96) 136/53 (08/13 1125) SpO2:  [92 %-100 %] 100 % (08/13 1125) Weight:  [76.2 kg] 76.2 kg (08/13 0837)  Intake/Output from previous day:  Intake/Output Summary (Last 24 hours) at 08/26/2019 1248 Last data filed at 08/26/2019 0940 Gross per 24 hour  Intake 1400 ml  Output 200 ml  Net 1200 ml    Intake/Output this shift: No intake/output data recorded.  Labs: Recent Labs    08/26/19 0523  HGB 11.5*   Recent Labs    08/26/19 0523  WBC 12.4*  RBC 3.85*  HCT 35.6*  PLT 231   Recent Labs    08/26/19 0523  NA 141  K 3.3*  CL 108  CO2 23  BUN 9  CREATININE 0.63  GLUCOSE 155*  CALCIUM 8.3*   No results for input(s): LABPT, INR in the last 72 hours.   EXAM General - Patient is Alert, Appropriate and Oriented Extremity - Sling intact to the right arm. Incision is dry without any significant drainage. Intact to light touch to the superior aspect of the shoulder. Decreased sensation to touch over the right deltoid this AM. Dressing/Incision - clean, dry, no drainage Motor Function - intact, she is able to flex and extend her fingers this morning.  Past Medical History:  Diagnosis Date  . Acid reflux   . Arthritis    Bilateral knees, hands, feet  . Asthma   . Atrial fibrillation ([place])   . Cancer ([place])   . CHF (congestive  heart failure) (HCC)    Diastolic CHF  . COPD (chronic obstructive pulmonary disease) ([place])   . Coronary artery disease   . Diabetes mellitus without complication ([place])   . Frequent headaches   . GI bleed   . HLD (hyperlipidemia)   . Hypertension   . Seizures ([place])   . Skin cancer 2015   Suspected basal cell carcinoma on nose, surgically removed  . Stroke ([place]) 04/2017  . Thyroid disease   . Tremors of nervous system     Assessment/Plan: 1 Day Post-Op Procedure(s) (LRB): REVERSE SHOULDER ARTHROPLASTY (Right) Active Problems:   Status post reverse total shoulder replacement, right  Estimated body mass index is 31.74 kg/m as calculated from the following:   Height as of this encounter: 5\' 1"  (1.549 m).   Weight as of this encounter: 76.2 kg. Advance diet Up with therapy D/C IV fluids when tolerating po intake.  Labs reviewed this AM. K+ 3.3, klorcon ordered for the patient. Up with therapy, begin working on BM. Current plan is for d/c to SNF when medically able and following insurance approval. Will plan on re-starting plavix at discharge.  Lovenox while in the hospital.  DVT Prophylaxis - Lovenox, Foot Pumps and TED hose Non-weightbearing to the right arm.  Raquel Joslyn Ramos, PA-C [facility] 08/26/2019, 12:48 PM

## 2019-08-27 LAB — BASIC METABOLIC PANEL
Anion gap: 7 (ref 5–15)
BUN: 16 mg/dL (ref 8–23)
CO2: 24 mmol/L (ref 22–32)
Calcium: 8.4 mg/dL — ABNORMAL LOW (ref 8.9–10.3)
Chloride: 108 mmol/L (ref 98–111)
Creatinine, Ser: 0.71 mg/dL (ref 0.44–1.00)
GFR calc Af Amer: >60 mL/min (ref >60)
GFR calc non Af Amer: >60 mL/min (ref >60)
Glucose, Bld: 173 mg/dL — ABNORMAL HIGH (ref 70–99)
Potassium: 4.5 mmol/L (ref 3.5–5.1)
Sodium: 139 mmol/L (ref 135–145)

## 2019-08-27 LAB — CBC
HCT: 33 % — ABNORMAL LOW (ref 36.0–46.0)
Hemoglobin: 10.4 g/dL — ABNORMAL LOW (ref 12.0–15.0)
MCH: 29.5 pg (ref 26.0–34.0)
MCHC: 31.5 g/dL (ref 30.0–36.0)
MCV: 93.5 fL (ref 80.0–100.0)
Platelets: 178 10*3/uL (ref 150–400)
RBC: 3.53 MIL/uL — ABNORMAL LOW (ref 3.87–5.11)
RDW: 14.8 % (ref 11.5–15.5)
WBC: 10.8 10*3/uL — ABNORMAL HIGH (ref 4.0–10.5)
nRBC: 0 % (ref 0.0–0.2)

## 2019-08-27 LAB — GLUCOSE, CAPILLARY
Glucose-Capillary: 170 mg/dL — ABNORMAL HIGH (ref 70–99)
Glucose-Capillary: 125 mg/dL — ABNORMAL HIGH (ref 70–99)
Glucose-Capillary: 83 mg/dL (ref 70–99)
Glucose-Capillary: 94 mg/dL (ref 70–99)

## 2019-08-27 NOTE — Progress Notes (Signed)
Physical Therapy Treatment Patient Details Name: Samantha Aguirre MRN: [id number] DOB: 08-30-1945 Today's Date: 08/27/2019    History of Present Illness 74 year old female with a long history of progressively worsening right shoulder pain and weakness. S/p reverse right total shoulder arthroplasty on 08/25/19.    PT Comments    Patient demonstrates good carry over of basic transfers with AD with increased independence. Patient is able to ambulate with quad cane supervision for safety with no stagger or LOB, but cuing needed to not "leave QC behind". Pt is able to complete stair negotiation with CGA for safety alternating steps unilateral handrail, CGA step to unilateral RUE support for descent. When attempting to step off last step patient reports she felt anxious and that she "is going down", and requiring max x2 to re-establish balance and sit in chair. Following patient has O2 79%, which quickly returns to 88% with pursed lip breathing education. Education on energy conservation with verbalized understanding. Pt lef tin room with nurse on 2L O2. Would benefit from skilled PT to address above deficits and promote optimal return to PLOF.   Follow Up Recommendations  Follow surgeon's recommendation for DC plan and follow-up therapies;Supervision/Assistance - 24 hour     Equipment Recommendations       Recommendations for Other Services       Precautions / Restrictions Restrictions Weight Bearing Restrictions: Yes RUE Weight Bearing: Non weight bearing    Mobility  Bed Mobility Overal bed mobility: Modified Independent;Needs Assistance Bed Mobility: Supine to Sit;Sit to Supine     Supine to sit: Supervision;HOB elevated Sit to supine: Min guard   General bed mobility comments: heavy use of L rail in and out of bed, but able to transition w/o phyiscal assist  Transfers Overall transfer level: Modified independent Equipment used: Quad cane Transfers: Sit to/from Stand Sit to  Stand: Modified independent (Device/Increase time);Supervision         General transfer comment: Good sequencing carry over from previous sessions, patient prefers small quad cane  Ambulation/Gait Ambulation/Gait assistance: Supervision Gait Distance (Feet): 150 Feet Assistive device: Quad cane   Gait velocity: decreased   General Gait Details: confident ambulation with minimal relative reliance on AD.  Cuing to prevent "leaving quad cae behind" with good carry over   Stairs Stairs: Yes Stairs assistance: Max assist;Min guard Stair Management: Two rails;Alternating pattern;Step to pattern Number of Stairs: 4 General stair comments: alternating pattern with RUE handrail support CGA for ascent; descent with RUE handrail, step to pattern, and CGA- until patient attempts to step off last step (without handrail) has post trunk lean and reports "I'm going down" and attempts to sit. With maxA x2 is able to regain balance, step off step and sit into chair, reporting "I just got nervous". O2 79%, rise to 88% in <42mins, 2L O2 applied in room   Wheelchair Mobility    Modified Rankin (Stroke Patients Only)       Balance Overall balance assessment: Needs assistance Sitting-balance support: No upper extremity supported;Feet supported Sitting balance-Leahy Scale: Good     Standing balance support: During functional activity Standing balance-Leahy Scale: Fair Standing balance comment: min use of support from quad cane, no other balance strategies needed in ambulation                            Cognition  Exercises Other Exercises Other Exercises: STS, patient able to comply with previous session cuing for transfer with DME Other Exercises: Amb 171ft with quad cane with cuing to "not leave cane behind" supervision for safety, patient able to maintain balance throughout Other Exercises: Stairs: ascent with  unilateral handrail CGA for safety; descent step to pattern with unilateral handrail support CGA, until stepping off last step where patient requires max x2 to regain re-establish balance and is then able step down and sit in chair. O2 79% with quick rise to 88% following education on pursed lip breathing. Education on energy conservation with verbalized understanding    General Comments        Pertinent Vitals/Pain Pain Assessment: Faces Pain Score: 10-Worst pain ever Faces Pain Scale: Hurts even more Pain Location: R shoulder Pain Descriptors / Indicators: Discomfort;Dull;Grimacing Pain Intervention(s): Limited activity within patient's tolerance    Home Living                      Prior Function            PT Goals (current goals can now be found in the care plan section) Acute Rehab PT Goals Patient Stated Goal: To increase independence  PT Goal Formulation: With patient Time For Goal Achievement: 09/09/19 Potential to Achieve Goals: Good Progress towards PT goals: Progressing toward goals    Frequency    Min 2X/week      PT Plan Current plan remains appropriate    Co-evaluation              AM-PAC PT "6 Clicks" Mobility   Outcome Measure  Help needed turning from your back to your side while in a flat bed without using bedrails?: A Lot Help needed moving from lying on your back to sitting on the side of a flat bed without using bedrails?: A Little Help needed moving to and from a bed to a chair (including a wheelchair)?: A Little Help needed standing up from a chair using your arms (e.g., wheelchair or bedside chair)?: A Little Help needed to walk in hospital room?: A Little Help needed climbing 3-5 steps with a railing? : A Lot 6 Click Score: 16    End of Session Equipment Utilized During Treatment: Gait belt Activity Tolerance: Patient tolerated treatment well Patient left: with call bell/phone within reach;with chair alarm set;with  nursing/sitter in room Nurse Communication: Mobility status PT Visit Diagnosis: Muscle weakness (generalized) (M62.81);Difficulty in walking, not elsewhere classified (R26.2)     Time: 1031-5945 PT Time Calculation (min) (ACUTE ONLY): 31 min  Charges:  $Therapeutic Activity: 23-37 mins                     Durwin Reges DPT   Durwin Reges 08/27/2019, 11:26 AM

## 2019-08-27 NOTE — Progress Notes (Signed)
  Subjective: 2 Days Post-Op Procedure(s) (LRB): REVERSE SHOULDER ARTHROPLASTY (Right) Patient reports pain as 5 on 0-10 scale.   Patient is well, and has had no acute complaints or problems PT and care management to assist with discharge planning. At this time plan is for discharge to SNF due to lack of support at home. Negative for chest pain and shortness of breath Fever: no Gastrointestinal:Negative for nausea and vomiting  Objective: Vital signs in last 24 hours: Temp:  [97.5 F (36.4 C)-98 F (36.7 C)] 97.7 F (36.5 C) (08/14 0730) Pulse Rate:  [45-94] 45 (08/14 0730) Resp:  [16-18] 18 (08/14 0730) BP: (107-136)/(46-61) 130/55 (08/14 0730) SpO2:  [99 %-100 %] 100 % (08/14 0730) Weight:  [76.2 kg] 76.2 kg (08/13 0837)  Intake/Output from previous day:  Intake/Output Summary (Last 24 hours) at 08/27/2019 0825 Last data filed at 08/27/2019 0752 Gross per 24 hour  Intake 3011.25 ml  Output --  Net 3011.25 ml    Intake/Output this shift: Total I/O In: 1265 [I.V.:1265] Out: -   Labs: Recent Labs    08/26/19 0523 08/27/19 0446  HGB 11.5* 10.4*   Recent Labs    08/26/19 0523 08/27/19 0446  WBC 12.4* 10.8*  RBC 3.85* 3.53*  HCT 35.6* 33.0*  PLT 231 178   Recent Labs    08/26/19 0523 08/27/19 0446  NA 141 139  K 3.3* 4.5  CL 108 108  CO2 23 24  BUN 9 16  CREATININE 0.63 0.71  GLUCOSE 155* 173*  CALCIUM 8.3* 8.4*   No results for input(s): LABPT, INR in the last 72 hours.   EXAM General - Patient is Alert, Appropriate and Oriented Extremity - Sling intact to the right arm. Incision is dry without any significant drainage. Intact to light touch to the superior and lateral aspect of the shoulder this AM. Dressing/Incision - clean, dry, no drainage Motor Function - intact, she is able to flex and extend her fingers this morning.  Past Medical History:  Diagnosis Date  . Acid reflux   . Arthritis    Bilateral knees, hands, feet  . Asthma   .  Atrial fibrillation ([place])   . Cancer ([place])   . CHF (congestive heart failure) (HCC)    Diastolic CHF  . COPD (chronic obstructive pulmonary disease) ([place])   . Coronary artery disease   . Diabetes mellitus without complication ([place])   . Frequent headaches   . GI bleed   . HLD (hyperlipidemia)   . Hypertension   . Seizures ([place])   . Skin cancer 2015   Suspected basal cell carcinoma on nose, surgically removed  . Stroke ([place]) 04/2017  . Thyroid disease   . Tremors of nervous system    Assessment/Plan: 2 Days Post-Op Procedure(s) (LRB): REVERSE SHOULDER ARTHROPLASTY (Right) Active Problems:   Status post reverse total shoulder replacement, right  Estimated body mass index is 31.74 kg/m as calculated from the following:   Height as of this encounter: 5\' 1"  (1.549 m).   Weight as of this encounter: 76.2 kg. Advance diet Up with therapy  Labs reviewed this AM. Hypokalemia resolved, 4.5 this AM. Up with therapy today.  Patient has had a BM. Current plan is for d/c to SNF. Will plan on re-starting plavix at discharge.  Lovenox while in the hospital.  DVT Prophylaxis - Lovenox, Foot Pumps and TED hose Non-weightbearing to the right arm.  Samantha Mckynna Vanloan, PA-C [facility] 08/27/2019, 8:25 AM

## 2019-08-27 NOTE — NC FL2 (Addendum)
[place] LEVEL OF CARE SCREENING TOOL     IDENTIFICATION  Patient Name: Samantha Aguirre Birthdate: [birth year] Sex: female Admission Date (Current Location): 08/25/2019  [place] and [place] Number:  Engineering geologist and Address:  [facility], [address], [place], [place] [address]      Provider Number: [id number]  Attending Physician Name and Address:  Corky Mull, MD  Relative Name and Phone Number:  Patient's daughter, Radiance Deady ([phone])    Current Level of Care: Hospital Recommended Level of Care: [place] Prior Approval Number:    Date Approved/Denied:   PASRR Number:   [id number]  Discharge Plan: SNF    Current Diagnoses: Patient Active Problem List   Diagnosis Date Noted  . Status post reverse total shoulder replacement, 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  . Stroke ([place]) 06/13/2018  . Hypotension 03/18/2018  . Malnutrition of moderate degree 03/16/2018  . Closed fracture of lumbar vertebral body ([place]) 03/15/2018  . Speaking difficulty 03/13/2018  . TIA (transient ischemic attack) 09/22/2017  . PAF (paroxysmal atrial fibrillation) ([place])   . Acute CVA (cerebrovascular accident) ([place]) 07/30/2017  . GI bleed 07/08/2017  . Anemia 07/08/2017  . Angiodysplasia of stomach and duodenum with hemorrhage   . CVA (cerebral vascular accident) ([place]) 07/06/2017  . Stroke (cerebrum) ([place])   . Goals of care, counseling/discussion   . Palliative care encounter   . COPD exacerbation ([place]) 04/03/2017  . Flu 03/27/2017  . Iron deficiency anemia due to chronic blood loss   . Migraine 02/13/2017  . Cataracts, bilateral 01/15/2017  . Chronic venous insufficiency 10/06/2016  . Recurrent major depressive disorder, in partial remission ([place]) 10/06/2016  . Left-sided weakness 10/03/2016  . Chest pain 02/27/2016  . History of stroke 02/19/2016  . Thumb pain, left 01/22/2016  . Traumatic closed nondisplaced fracture of base of  metacarpal bone of left thumb 01/22/2016  . Colon cancer screening 11/27/2015  . Depression 11/14/2015  . Coronary artery disease 11/13/2015  . Hypertension 11/13/2015  . History of skin cancer 11/13/2015  . Osteoporosis 11/13/2015  . Chronic low back pain 11/13/2015  . Left medial knee pain 11/13/2015  . Osteoarthritis of multiple joints 11/13/2015  . Other specified hypothyroidism 08/16/2015  . Controlled type 2 diabetes mellitus with diabetic neuropathy ([place]) 08/16/2015  . COPD (chronic obstructive pulmonary disease) ([place]) 08/16/2015  . Tobacco abuse 08/16/2015  . HLD (hyperlipidemia) 08/16/2015  . History of CHF (congestive heart failure) 08/16/2015  . Seizure disorder ([place])     Orientation RESPIRATION BLADDER Height & Weight     Self, Time, Situation, Place  Normal Continent Weight: 167 lb 15.9 oz (76.2 kg) Height:  5\' 1"  (154.9 cm)  BEHAVIORAL SYMPTOMS/MOOD NEUROLOGICAL BOWEL NUTRITION STATUS      Continent Diet  AMBULATORY STATUS COMMUNICATION OF NEEDS Skin   Supervision Verbally Normal                       Personal Care Assistance Level of Assistance              Functional Limitations Info             SPECIAL CARE FACTORS FREQUENCY                       Contractures Contractures Info: Not present    Additional Factors Info  Code Status Code Status Info: FULL  Current Medications (08/27/2019):  This is the current hospital active medication list Current Facility-Administered Medications  Medication Dose Route Frequency Provider Last Rate Last Admin  . 0.9 %  sodium chloride infusion   Intravenous Continuous Poggi, Marshall Cork, MD 75 mL/hr at 08/25/19 1855 Restarted at 08/25/19 1855  . acetaminophen (TYLENOL) tablet 325-650 mg  325-650 mg Oral Q6H PRN Poggi, Marshall Cork, MD      . albuterol (PROVENTIL) (2.5 MG/3ML) 0.083% nebulizer solution 3 mL  3 mL Inhalation Q6H PRN Poggi, Marshall Cork, MD      . aspirin chewable tablet 81 mg  81 mg  Oral Daily Poggi, Marshall Cork, MD   81 mg at 08/27/19 0839  . atorvastatin (LIPITOR) tablet 80 mg  80 mg Oral q1800 Poggi, Marshall Cork, MD   80 mg at 08/26/19 1756  . bisacodyl (DULCOLAX) suppository 10 mg  10 mg Rectal Daily PRN Poggi, Marshall Cork, MD      . diphenhydrAMINE (BENADRYL) 12.5 MG/5ML elixir 12.5-25 mg  12.5-25 mg Oral Q4H PRN Poggi, Marshall Cork, MD      . docusate sodium (COLACE) capsule 100 mg  100 mg Oral BID Corky Mull, MD   100 mg at 08/27/19 0839  . DULoxetine (CYMBALTA) DR capsule 60 mg  60 mg Oral Daily Poggi, Marshall Cork, MD   60 mg at 08/27/19 5784  . enoxaparin (LOVENOX) injection 40 mg  40 mg Subcutaneous Q24H Poggi, Marshall Cork, MD   40 mg at 08/27/19 0840  . ferrous sulfate tablet 325 mg  325 mg Oral Daily Poggi, Marshall Cork, MD   325 mg at 08/27/19 6962  . fluticasone (FLONASE) 50 MCG/ACT nasal spray 1 spray  1 spray Each Nare BID PRN Poggi, Marshall Cork, MD      . furosemide (LASIX) tablet 20 mg  20 mg Oral Daily Poggi, Marshall Cork, MD   20 mg at 08/27/19 9528  . gabapentin (NEURONTIN) capsule 800 mg  800 mg Oral BID Corky Mull, MD   800 mg at 08/27/19 4132  . glipiZIDE (GLUCOTROL) tablet 10 mg  10 mg Oral QAC breakfast Poggi, Marshall Cork, MD   10 mg at 08/27/19 4401   And  . metFORMIN (GLUCOPHAGE) tablet 1,000 mg  1,000 mg Oral QAC breakfast Poggi, Marshall Cork, MD   1,000 mg at 08/27/19 0839  . glipiZIDE (GLUCOTROL) tablet 5 mg  5 mg Oral QAC supper Poggi, Marshall Cork, MD       And  . metFORMIN (GLUCOPHAGE) tablet 500 mg  500 mg Oral QAC supper Poggi, Marshall Cork, MD      . insulin aspart (novoLOG) injection 0-15 Units  0-15 Units Subcutaneous TID WC Poggi, Marshall Cork, MD   3 Units at 08/27/19 0840  . levETIRAcetam (KEPPRA) tablet 500 mg  500 mg Oral BID Corky Mull, MD   500 mg at 08/26/19 2248  . levothyroxine (SYNTHROID) tablet 125 mcg  125 mcg Oral Q0600 Corky Mull, MD   125 mcg at 08/27/19 0612  . magnesium hydroxide (MILK OF MAGNESIA) suspension 30 mL  30 mL Oral Daily PRN Poggi, Marshall Cork, MD      . metoCLOPramide  (REGLAN) tablet 5-10 mg  5-10 mg Oral Q8H PRN Poggi, Marshall Cork, MD       Or  . metoCLOPramide (REGLAN) injection 5-10 mg  5-10 mg Intravenous Q8H PRN Poggi, Marshall Cork, MD      . nitroGLYCERIN (NITROSTAT) SL tablet 0.4 mg  0.4 mg  Sublingual Q5 min PRN Poggi, Marshall Cork, MD      . ondansetron [facility]) tablet 4 mg  4 mg Oral Q6H PRN Poggi, Marshall Cork, MD   4 mg at 08/26/19 1056   Or  . ondansetron (ZOFRAN) injection 4 mg  4 mg Intravenous Q6H PRN Poggi, Marshall Cork, MD      . pantoprazole (PROTONIX) EC tablet 40 mg  40 mg Oral Daily Poggi, Marshall Cork, MD   40 mg at 08/27/19 0839  . potassium chloride (KLOR-CON) packet 40 mEq  40 mEq Oral BID Lattie Corns, PA-C   40 mEq at 08/27/19 4782  . sodium phosphate (FLEET) 7-19 GM/118ML enema 1 enema  1 enema Rectal Once PRN Poggi, Marshall Cork, MD      . topiramate (TOPAMAX) tablet 25 mg  25 mg Oral BID Corky Mull, MD   25 mg at 08/26/19 2247  . traMADol (ULTRAM) tablet 50 mg  50 mg Oral Q6H Poggi, Marshall Cork, MD   50 mg at 08/27/19 [phone]  . vitamin B-12 (CYANOCOBALAMIN) tablet 1,000 mcg  1,000 mcg Oral Daily Poggi, Marshall Cork, MD   1,000 mcg at 08/27/19 1308     Discharge Medications: Please see discharge summary for a list of discharge medications.  Relevant Imaging Results:  Relevant Lab Results:   Additional Information ss #  [id number]  Trecia Rogers, LCSW

## 2019-08-28 LAB — CBC
HCT: 36.6 % (ref 36.0–46.0)
Hemoglobin: 11.9 g/dL — ABNORMAL LOW (ref 12.0–15.0)
MCH: 30.1 pg (ref 26.0–34.0)
MCHC: 32.5 g/dL (ref 30.0–36.0)
MCV: 92.7 fL (ref 80.0–100.0)
Platelets: 145 10*3/uL — ABNORMAL LOW (ref 150–400)
RBC: 3.95 MIL/uL (ref 3.87–5.11)
RDW: 14.5 % (ref 11.5–15.5)
WBC: 9.3 10*3/uL (ref 4.0–10.5)
nRBC: 0 % (ref 0.0–0.2)

## 2019-08-28 LAB — BASIC METABOLIC PANEL
Anion gap: 11 (ref 5–15)
BUN: 10 mg/dL (ref 8–23)
CO2: 23 mmol/L (ref 22–32)
Calcium: 8.8 mg/dL — ABNORMAL LOW (ref 8.9–10.3)
Chloride: 105 mmol/L (ref 98–111)
Creatinine, Ser: 0.59 mg/dL (ref 0.44–1.00)
GFR calc Af Amer: >60 mL/min (ref >60)
GFR calc non Af Amer: >60 mL/min (ref >60)
Glucose, Bld: 95 mg/dL (ref 70–99)
Potassium: 4.8 mmol/L (ref 3.5–5.1)
Sodium: 139 mmol/L (ref 135–145)

## 2019-08-28 LAB — GLUCOSE, CAPILLARY
Glucose-Capillary: 139 mg/dL — ABNORMAL HIGH (ref 70–99)
Glucose-Capillary: 47 mg/dL — ABNORMAL LOW (ref 70–99)
Glucose-Capillary: 57 mg/dL — ABNORMAL LOW (ref 70–99)
Glucose-Capillary: 66 mg/dL — ABNORMAL LOW (ref 70–99)
Glucose-Capillary: 129 mg/dL — ABNORMAL HIGH (ref 70–99)
Glucose-Capillary: 113 mg/dL — ABNORMAL HIGH (ref 70–99)

## 2019-08-28 LAB — GLUCOSE, POCT (MANUAL RESULT ENTRY): POC Glucose: 81 mg/dl (ref 70–99)

## 2019-08-28 NOTE — Progress Notes (Signed)
CBG 47, 8 oz of orange juice given per protocol. Recheck CBG at 1730

## 2019-08-28 NOTE — Progress Notes (Signed)
Subjective: 3 Days Post-Op Procedure(s) (LRB): REVERSE SHOULDER ARTHROPLASTY (Right) Patient reports pain as 5 on 0-10 scale.   Patient is well, and has had no acute complaints or problems PT and care management to assist with discharge planning, current plan is for discharge to SNF due to lack of support at home. Negative for chest pain and shortness of breath Fever: no Gastrointestinal:Negative for nausea and vomiting  Objective: Vital signs in last 24 hours: Temp:  [97.5 F (36.4 C)-97.7 F (36.5 C)] 97.6 F (36.4 C) (08/15 0733) Pulse Rate:  [50-62] 50 (08/15 0733) Resp:  [16-18] 17 (08/15 0733) BP: (111-122)/(42-84) 119/42 (08/15 0733) SpO2:  [79 %-100 %] 100 % (08/15 0733)  Intake/Output from previous day:  Intake/Output Summary (Last 24 hours) at 08/28/2019 0830 Last data filed at 08/28/2019 0305 Gross per 24 hour  Intake 856.25 ml  Output --  Net 856.25 ml    Intake/Output this shift: No intake/output data recorded.  Labs: Recent Labs    08/26/19 0523 08/27/19 0446 08/28/19 0520  HGB 11.5* 10.4* 11.9*   Recent Labs    08/27/19 0446 08/28/19 0520  WBC 10.8* 9.3  RBC 3.53* 3.95  HCT 33.0* 36.6  PLT 178 145*   Recent Labs    08/27/19 0446 08/28/19 0520  NA 139 139  K 4.5 4.8  CL 108 105  CO2 24 23  BUN 16 10  CREATININE 0.71 0.59  GLUCOSE 173* 95  CALCIUM 8.4* 8.8*   No results for input(s): LABPT, INR in the last 72 hours.  EXAM General - Patient is Alert, Appropriate and Oriented Extremity - Sling intact to the right arm. Incision is dry without any significant drainage. Patient reports that she can feel me scratching over the lateral aspect of the deltoid. Dressing/Incision - clean, dry, no drainage Motor Function - intact, she is able to flex and extend her fingers this morning. Negative Homans to bilateral lower extremities.  Past Medical History:  Diagnosis Date  . Acid reflux   . Arthritis    Bilateral knees, hands, feet  .  Asthma   . Atrial fibrillation ([place])   . Cancer ([place])   . CHF (congestive heart failure) ([facility])    Diastolic CHF  . COPD (chronic obstructive pulmonary disease) ([place])   . Coronary artery disease   . Diabetes mellitus without complication ([place])   . Frequent headaches   . GI bleed   . HLD (hyperlipidemia)   . Hypertension   . Seizures ([place])   . Skin cancer 2015   Suspected basal cell carcinoma on nose, surgically removed  . Stroke ([place]) 04/2017  . Thyroid disease   . Tremors of nervous system    Assessment/Plan: 3 Days Post-Op Procedure(s) (LRB): REVERSE SHOULDER ARTHROPLASTY (Right) Active Problems:   Status post reverse total shoulder replacement, right  Estimated body mass index is 31.74 kg/m as calculated from the following:   Height as of this encounter: 5\' 1"  (1.549 m).   Weight as of this encounter: 76.2 kg. Advance diet Up with therapy  Labs reviewed this AM. HR 50 this AM, BP 119/42.  Patient currently asymptomatic, will continue to monitor. Hypokalemia resolved, 4.8 this AM. Up with therapy today.  Patient has had a BM. Current plan is for d/c to SNF, likely tomorrow. Will plan on re-starting plavix at discharge.  Lovenox while in the hospital.  DVT Prophylaxis - Lovenox, Foot Pumps and TED hose Non-weightbearing to the right arm.  Raquel Nikolina Simerson, PA-C [facility]  Orthopaedic Surgery 08/28/2019, 8:30 AM

## 2019-08-28 NOTE — Progress Notes (Addendum)
Physical Therapy Treatment Patient Details Name: Samantha Aguirre MRN: [id number] DOB: 10-11-1945 Today's Date: 08/28/2019    History of Present Illness 74 year old female with a long history of progressively worsening right shoulder pain and weakness. S/p reverse right total shoulder arthroplasty on 08/25/19.    PT Comments    Patient is seated in recliner finishing her breakfast. Her O2 [place] is not on and her O2 is 83% on room air. She performs sit to stand with quad cane and 2 L O2 with O2 saturation 92%. She ambulates 75 feet and min assist with quad cane, O2 saturation decreases to 79%. O2 is increased to 4 L of O2 and her O2 sat increases to 95%. She is taken back to room in a wc due to poor tolerance of gait with 2 L O2 and fatigue. She is assisted back to the recliner and O2 is decreased to 2 L [place]. She will continue to benefit from skilled PT to improve mobility and strength.   Follow Up Recommendations  SNF     Equipment Recommendations  Other (comment) quad cane    Recommendations for Other Services  SNF     Precautions / Restrictions Precautions Precautions: Fall Restrictions Weight Bearing Restrictions: Yes RUE Weight Bearing: Non weight bearing    Mobility  Bed Mobility Overal bed mobility:  (Pt up in recliner- bed mob NT)                Transfers Overall transfer level: Modified independent Equipment used: Quad cane Transfers: Sit to/from Stand Sit to Stand: Min assist         General transfer comment: vC for safety  Ambulation/Gait Ambulation/Gait assistance: Herbalist (Feet): 75 Feet Assistive device: Quad cane Gait Pattern/deviations: Step-to pattern         Stairs         General stair comments: O2 sat decreased to 83% on 2 L O2   Wheelchair Mobility    Modified Rankin (Stroke Patients Only)       Balance Overall balance assessment: Needs assistance Sitting-balance support: No upper extremity supported;Feet  supported Sitting balance-Leahy Scale: Good     Standing balance support: During functional activity Standing balance-Leahy Scale: Fair Standing balance comment: min use of support from quad cane, no other balance strategies needed in ambulation                            Cognition Arousal/Alertness: Awake/alert Behavior During Therapy: WFL for tasks assessed/performed Overall Cognitive Status: Within Functional Limits for tasks assessed                                        Exercises      General Comments        Pertinent Vitals/Pain Pain Assessment: Faces Faces Pain Scale: Hurts little more Pain Location: right shoulder Pain Descriptors / Indicators: Aching Pain Intervention(s): Limited activity within patient's tolerance;Monitored during session    Home Living                      Prior Function            PT Goals (current goals can now be found in the care plan section) Acute Rehab PT Goals Patient Stated Goal: To increase independence  Time For Goal Achievement: 09/09/19 Potential to Achieve Goals: Good  Progress towards PT goals: Progressing toward goals    Frequency    Min 2X/week      PT Plan Current plan remains appropriate    Co-evaluation              AM-PAC PT "6 Clicks" Mobility   Outcome Measure  Help needed turning from your back to your side while in a flat bed without using bedrails?: A Lot (quad cane) Help needed moving from lying on your back to sitting on the side of a flat bed without using bedrails?: A Lot Help needed moving to and from a bed to a chair (including a wheelchair)?: A Lot Help needed standing up from a chair using your arms (e.g., wheelchair or bedside chair)?: A Lot Help needed to walk in hospital room?: A Lot Help needed climbing 3-5 steps with a railing? : A Lot 6 Click Score: 12    End of Session Equipment Utilized During Treatment: Gait belt Activity Tolerance:  Patient tolerated treatment well Patient left: with call bell/phone within reach;with chair alarm set;with nursing/sitter in room Nurse Communication: Mobility status PT Visit Diagnosis: Muscle weakness (generalized) (M62.81);Difficulty in walking, not elsewhere classified (R26.2)     Time: 0569-7948 PT Time Calculation (min) (ACUTE ONLY): 30 min  Charges:  $Gait Training: 23-37 mins                       [place], Sherryl Barters, PT DPT 08/28/2019, 10:44 AM

## 2019-08-28 NOTE — Progress Notes (Signed)
CBG 57, 4 oz juice given. Recheck in 15 minutes

## 2019-08-29 LAB — GLUCOSE, CAPILLARY
Glucose-Capillary: 81 mg/dL (ref 70–99)
Glucose-Capillary: 150 mg/dL — ABNORMAL HIGH (ref 70–99)
Glucose-Capillary: 145 mg/dL — ABNORMAL HIGH (ref 70–99)

## 2019-08-29 LAB — SURGICAL PATHOLOGY

## 2019-08-29 LAB — SARS CORONAVIRUS 2 BY RT PCR (HOSPITAL ORDER, PERFORMED IN ~~LOC~~ HOSPITAL LAB): SARS Coronavirus 2: NEGATIVE

## 2019-08-29 MED ORDER — POTASSIUM CHLORIDE CRYS ER 20 MEQ PO TBCR
EXTENDED_RELEASE_TABLET | ORAL | Status: AC
  Filled 2019-08-29: qty 1

## 2019-08-29 MED ORDER — POTASSIUM CHLORIDE CRYS ER 20 MEQ PO TBCR
EXTENDED_RELEASE_TABLET | ORAL | Status: AC
  Administered 2019-08-29: 20 meq
  Filled 2019-08-29: qty 2

## 2019-08-29 MED ORDER — TRAMADOL HCL 50 MG PO TABS: 50.0000 mg | ORAL_TABLET | Freq: Four times a day (QID) | ORAL | 0 refills | Status: DC

## 2019-08-29 NOTE — TOC Progression Note (Signed)
Transition of Care [facility]) - Progression Note    Patient Details  Name: Samantha Aguirre MRN: [id number] Date of Birth: Sep 20, 1945  [facility] of [facility]) CM/SW Contact  Shelbie Ammons, RN Phone Number: 08/29/2019, 3:17 PM  Clinical Narrative:   RNCM followed up with patient and spoke with daughter Samantha Aguirre by phone regarding facilities that had offered beds. After some discussion they are choosing Samantha Aguirre for rehab. Spoke with facility rep by phone and discussed transfer there today, Samantha Aguirre is agreeable and EMS paperwork was completed and EMS was called for transport.          Expected Discharge Plan and Services           Expected Discharge Date: 08/29/19                                     Social Determinants of Health (SDOH) Interventions    Readmission Risk Interventions Readmission Risk Prevention Plan 10/16/2018 06/15/2018  Transportation Screening Complete Complete  PCP or Specialist Appt within 5-7 Days Complete -  PCP or Specialist Appt within 3-5 Days - Complete  Home Care Screening Complete -  Medication Review (RN CM) Complete -  HRI or [place] - Complete  Social Work Consult for [place] Planning/Counseling - Not Complete  SW consult not completed comments - na  Palliative Care Screening - Not Applicable  Medication Review Press photographer) - Complete  Some recent data might be hidden

## 2019-08-29 NOTE — Discharge Instructions (Signed)
Diet: As you were doing prior to hospitalization   Shower:  May shower but keep the wounds dry, use an occlusive plastic wrap, NO SOAKING IN TUB.  If the bandage gets wet, change with a clean dry gauze.  Dressing:  You may change your dressing as needed. Change the dressing with sterile gauze dressing.    Activity:  Increase activity slowly as tolerated, but follow the weight bearing instructions below.  No lifting or driving for 6 weeks.  Weight Bearing:   Non-weightbearing to the right shoulder.  To prevent constipation: you may use a stool softener such as -  Colace (over the counter) 100 mg by mouth twice a day  Drink plenty of fluids (prune juice may be helpful) and high fiber foods Miralax (over the counter) for constipation as needed.    Itching:  If you experience itching with your medications, try taking only a single pain pill, or even half a pain pill at a time.  You may take up to 10 pain pills per day, and you can also use benadryl over the counter for itching or also to help with sleep.   Precautions:  If you experience chest pain or shortness of breath - call 911 immediately for transfer to the hospital emergency department!!  If you develop a fever greater that 101 F, purulent drainage from wound, increased redness or drainage from wound, or calf pain-Call [place]                                              Follow- Up Appointment:  Please call for an appointment to be seen in 2 weeks at [facility]

## 2019-08-29 NOTE — Care Management Important Message (Signed)
Important Message  Patient Details  Name: Samantha Aguirre MRN: [id number] Date of Birth: 08-May-1945   Medicare Important Message Given:  Yes     Loann Quill 08/29/2019, 11:28 AM

## 2019-08-29 NOTE — Discharge Summary (Signed)
Physician Discharge Summary  Patient ID: Samantha Aguirre MRN: [id number] DOB/AGE: [birth year] 75 y.o.  Admit date: 08/25/2019 Discharge date: 08/29/2019  Admission Diagnoses:  Status post reverse total shoulder replacement, right [Z96.611]  Discharge Diagnoses: Patient Active Problem List   Diagnosis Date Noted  . Status post reverse total shoulder replacement, 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  . Stroke ([place]) 06/13/2018  . Hypotension 03/18/2018  . Malnutrition of moderate degree 03/16/2018  . Closed fracture of lumbar vertebral body ([place]) 03/15/2018  . Speaking difficulty 03/13/2018  . TIA (transient ischemic attack) 09/22/2017  . PAF (paroxysmal atrial fibrillation) ([place])   . Acute CVA (cerebrovascular accident) ([place]) 07/30/2017  . GI bleed 07/08/2017  . Anemia 07/08/2017  . Angiodysplasia of stomach and duodenum with hemorrhage   . CVA (cerebral vascular accident) ([place]) 07/06/2017  . Stroke (cerebrum) ([place])   . Goals of care, counseling/discussion   . Palliative care encounter   . COPD exacerbation ([place]) 04/03/2017  . Flu 03/27/2017  . Iron deficiency anemia due to chronic blood loss   . Migraine 02/13/2017  . Cataracts, bilateral 01/15/2017  . Chronic venous insufficiency 10/06/2016  . Recurrent major depressive disorder, in partial remission ([place]) 10/06/2016  . Left-sided weakness 10/03/2016  . Chest pain 02/27/2016  . History of stroke 02/19/2016  . Thumb pain, left 01/22/2016  . Traumatic closed nondisplaced fracture of base of metacarpal bone of left thumb 01/22/2016  . Colon cancer screening 11/27/2015  . Depression 11/14/2015  . Coronary artery disease 11/13/2015  . Hypertension 11/13/2015  . History of skin cancer 11/13/2015  . Osteoporosis 11/13/2015  . Chronic low back pain 11/13/2015  . Left medial knee pain 11/13/2015  . Osteoarthritis of multiple joints 11/13/2015  . Other specified hypothyroidism 08/16/2015  . Controlled type 2 diabetes mellitus with diabetic  neuropathy ([place]) 08/16/2015  . COPD (chronic obstructive pulmonary disease) ([place]) 08/16/2015  . Tobacco abuse 08/16/2015  . HLD (hyperlipidemia) 08/16/2015  . History of CHF (congestive heart failure) 08/16/2015  . Seizure disorder [facility])     Past Medical History:  Diagnosis Date  . Acid reflux   . Arthritis    Bilateral knees, hands, feet  . Asthma   . Atrial fibrillation ([place])   . Cancer ([place])   . CHF (congestive heart failure) ([facility])    Diastolic CHF  . COPD (chronic obstructive pulmonary disease) ([place])   . Coronary artery disease   . Diabetes mellitus without complication ([place])   . Frequent headaches   . GI bleed   . HLD (hyperlipidemia)   . Hypertension   . Seizures ([place])   . Skin cancer 2015   Suspected basal cell carcinoma on nose, surgically removed  . Stroke ([place]) 04/2017  . Thyroid disease   . Tremors of nervous system      Transfusion: None.   Consultants (if any):   Discharged Condition: Improved  Hospital Course: Samantha Aguirre is an 74 y.o. female who was admitted 08/25/2019 with a diagnosis of rotator cuff arthropathy with re-current rotator cuff tear of the right shoulder and went to the operating room on 08/25/2019 and underwent the above named procedures.    Surgeries: Procedure(s): REVERSE SHOULDER ARTHROPLASTY on 08/25/2019 Patient tolerated the surgery well. Taken to PACU where she was stabilized and then transferred to the orthopedic floor.  Started on Lovenox 40mg   q 24 hrs, upon discharge she was placed back on her Plavix 75mg  daily regimen. Foot pumps applied bilaterally at 80 mm. Heels elevated on bed with rolled towels. No evidence  of DVT. Negative Homan. Physical therapy started on day #1 for gait training and transfer. OT started day #1 for ADL and assisted devices.  Due to slow progress with PT and no support at home, current plan is for discharge to rehab today, 08/29/19  Patient's IV was removed on POD4  Implants: All press-fit [employer]  Comprehensive system with a #12 micro-humeral stem, a 40 mm humeral tray with a standard insert, and a mini-base plate with a 36 mm glenosphere.  She was given perioperative antibiotics:  Anti-infectives (From admission, onward)   Start     Dose/Rate Route Frequency Ordered Stop   08/25/19 1900  ceFAZolin (ANCEF) IVPB 2g/100 mL premix        2 g 200 mL/hr over 30 Minutes Intravenous Every 6 hours 08/25/19 1653 08/26/19 0715   08/25/19 1041  ceFAZolin (ANCEF) 2-4 GM/100ML-% IVPB       Note to Pharmacy: Samantha Aguirre   : cabinet override      08/25/19 1041 08/25/19 1313   08/25/19 0600  ceFAZolin (ANCEF) IVPB 2g/100 mL premix        2 g 200 mL/hr over 30 Minutes Intravenous On call to O.R. 08/24/19 2230 08/25/19 1243    .  She was given sequential compression devices, early ambulation, and Lovenox for DVT prophylaxis.  She benefited maximally from the hospital stay and there were no complications.    Recent vital signs:  Vitals:   08/28/19 2350 08/29/19 0724  BP: 129/63 123/63  Pulse: 62 (!) 53  Resp: 16 15  Temp: 98.2 F (36.8 C) 97.7 F (36.5 C)  SpO2: 95% 94%    Recent laboratory studies:  Lab Results  Component Value Date   HGB 11.9 (L) 08/28/2019   HGB 10.4 (L) 08/27/2019   HGB 11.5 (L) 08/26/2019   Lab Results  Component Value Date   WBC 9.3 08/28/2019   PLT 145 (L) 08/28/2019   Lab Results  Component Value Date   INR 1.0 10/15/2018   Lab Results  Component Value Date   NA 139 08/28/2019   K 4.8 08/28/2019   CL 105 08/28/2019   CO2 23 08/28/2019   BUN 10 08/28/2019   CREATININE 0.59 08/28/2019   GLUCOSE 95 08/28/2019    Discharge Medications:   Allergies as of 08/29/2019      Reactions   Ivp Dye [iodinated Diagnostic Agents] Itching   Pt injected with IV contrast.  5 min after injection pt complained of itching behind ear and on her abd.   Methotrexate Rash   Morphine And Related Other (See Comments)   "stops my breathing" NEAR-RESPIRATORY  ARREST   Mushroom Extract Complex Anaphylaxis   Made patient "deathly sick"   Atenolol Other (See Comments)   "MD took me off of it bc it was doing something wrong" Made patient HYPOTENSIVE   Percocet [oxycodone-acetaminophen] Nausea And Vomiting, Other (See Comments)   Stomach pains   Percodan [oxycodone-aspirin] Nausea And Vomiting, Other (See Comments)   Stomach pains   Tramadol Nausea Only   Verapamil Other (See Comments)   " MD told me this was screwing me up" MAKES PATIENT HYPOTENSIVE   Oxycodone Hcl       Medication List    TAKE these medications   albuterol 108 (90 Base) MCG/ACT inhaler Commonly known as: VENTOLIN HFA Inhale 2 puffs into the lungs every 6 (six) hours as needed for wheezing or shortness of breath.   aspirin 81 MG chewable tablet Commonly known as:  Aspirin Childrens Chew 1 tablet (81 mg total) by mouth daily.   atorvastatin 80 MG tablet Commonly known as: LIPITOR Take 1 tablet (80 mg total) by mouth daily at 6 PM.   clopidogrel 75 MG tablet Commonly known as: PLAVIX Take 75 mg by mouth daily.   DULoxetine 60 MG capsule Commonly known as: CYMBALTA Take 1 capsule (60 mg total) by mouth at bedtime. What changed: when to take this   ferrous sulfate 325 (65 FE) MG EC tablet Take 1 tablet (325 mg total) by mouth 2 (two) times daily with a meal. What changed: when to take this   fluticasone 50 MCG/ACT nasal spray Commonly known as: FLONASE Place 1 spray into both nostrils 2 (two) times daily as needed for allergies.   furosemide 20 MG tablet Commonly known as: LASIX Take 20 mg by mouth daily.   gabapentin 800 MG tablet Commonly known as: NEURONTIN Take 1 tablet (800 mg total) by mouth 2 (two) times daily.   glipiZIDE-metformin 5-500 MG tablet Commonly known as: METAGLIP Take 1 tablet by mouth 2 (two) times daily before a meal. What changed:   how much to take  when to take this  additional instructions   levETIRAcetam 500 MG  tablet Commonly known as: KEPPRA Take 1 tablet (500 mg total) by mouth 2 (two) times daily.   levothyroxine 125 MCG tablet Commonly known as: SYNTHROID Take 125 mcg by mouth daily before breakfast.   nitroGLYCERIN 0.4 MG SL tablet Commonly known as: NITROSTAT Place 1 tablet (0.4 mg total) under the tongue every 5 (five) minutes as needed for chest pain.   omeprazole 20 MG capsule Commonly known as: PRILOSEC Take 20 mg by mouth daily.   topiramate 25 MG tablet Commonly known as: TOPAMAX Take 25 mg by mouth 2 (two) times daily.   traMADol 50 MG tablet Commonly known as: ULTRAM Take 1 tablet (50 mg total) by mouth every 6 (six) hours.   vitamin B-12 1000 MCG tablet Commonly known as: CYANOCOBALAMIN Take 1,000 mcg by mouth daily.       Diagnostic Studies: DG Shoulder Right Port  Result Date: 08/25/2019 CLINICAL DATA:  Post reverse shoulder arthroplasty EXAM: PORTABLE RIGHT SHOULDER COMPARISON:  MRI 05/17/2019 FINDINGS: Postsurgical changes from reverse shoulder arthroplasty with an articulating humeral stem and screw fixed glenoid component in expected alignment for these somewhat nonstandard AP and oblique projections. Overlying postsurgical soft tissue changes and skin staples are noted. No acute complication is clearly evident. IMPRESSION: Postsurgical changes from reverse shoulder arthroplasty without evidence of acute complication. Electronically Signed   By: Lovena Le M.D.   On: 08/25/2019 17:12   Disposition: Plan for discharge to rehab today, will obtain at [place] test prior to discharge today.   Follow-up Information    Lattie Corns, PA-C Follow up in 14 day(s).   Specialty: Physician Assistant Why: Electa Sniff information: [place] [address] [phone]              Signed: Judson Roch PA-C 08/29/2019, 7:51 AM

## 2019-08-29 NOTE — Progress Notes (Signed)
Patient is stable and ready for discharge. Patient is discharging to [facility]. Patient's IV removed without issues. Writer called report and spoke with receiving nurse Colletta Maryland, RN and she verbalized understanding of report giving and had no questions. Patient's belongings packed and with patient. Patient's hard script prescriptions in patient's discharge packet. Patient transporting via EMS.

## 2019-08-29 NOTE — Progress Notes (Signed)
Subjective: 4 Days Post-Op Procedure(s) (LRB): REVERSE SHOULDER ARTHROPLASTY (Right) Patient reports pain as 5 on 0-10 scale.   Patient is well, and has had no acute complaints or problems Plan for discharge to SNF today. Negative for chest pain and shortness of breath Did require O2 while working with therapy, at 94% on room air in the chair this AM, denies any SOB or chest pain. Fever: no Gastrointestinal:Negative for nausea and vomiting  Objective: Vital signs in last 24 hours: Temp:  [97.7 F (36.5 C)-98.2 F (36.8 C)] 97.7 F (36.5 C) (08/16 0724) Pulse Rate:  [53-69] 53 (08/16 0724) Resp:  [15-16] 15 (08/16 0724) BP: (112-129)/(60-63) 123/63 (08/16 0724) SpO2:  [94 %-97 %] 94 % (08/16 0724)  Intake/Output from previous day:  Intake/Output Summary (Last 24 hours) at 08/29/2019 0746 Last data filed at 08/28/2019 1354 Gross per 24 hour  Intake 1888.75 ml  Output --  Net 1888.75 ml    Intake/Output this shift: No intake/output data recorded.  Labs: Recent Labs    08/27/19 0446 08/28/19 0520  HGB 10.4* 11.9*   Recent Labs    08/27/19 0446 08/28/19 0520  WBC 10.8* 9.3  RBC 3.53* 3.95  HCT 33.0* 36.6  PLT 178 145*   Recent Labs    08/27/19 0446 08/28/19 0520  NA 139 139  K 4.5 4.8  CL 108 105  CO2 24 23  BUN 16 10  CREATININE 0.71 0.59  GLUCOSE 173* 95  CALCIUM 8.4* 8.8*   No results for input(s): LABPT, INR in the last 72 hours.  EXAM General - Patient is Alert, Appropriate and Oriented Extremity - Sling intact to the right arm. Incision is dry without any significant drainage. Patient reports that she can feel me scratching over the lateral aspect of the deltoid. Dressing/Incision - clean, dry, no drainage Motor Function - intact, she is able to flex and extend her fingers this morning. Negative Homans to bilateral lower extremities.  Past Medical History:  Diagnosis Date   Acid reflux    Arthritis    Bilateral knees, hands, feet    Asthma    Atrial fibrillation ([facility])    Cancer ([facility])    CHF (congestive heart failure) (HCC)    Diastolic CHF   COPD (chronic obstructive pulmonary disease) (H[facility])    Coronary artery disease    Diabetes mellitus without complication ([facility])    Frequent headaches    GI bleed    HLD (hyperlipidemia)    Hypertension    Seizures ([place])    Skin cancer 2015   Suspected basal cell carcinoma on nose, surgically removed   Stroke ([place]) 04/2017   Thyroid disease    Tremors of nervous system    Assessment/Plan: 4 Days Post-Op Procedure(s) (LRB): REVERSE SHOULDER ARTHROPLASTY (Right) Active Problems:   Status post reverse total shoulder replacement, right  Estimated body mass index is 31.74 kg/m as calculated from the following:   Height as of this encounter: 5\' 1"  (1.549 m).   Weight as of this encounter: 76.2 kg. Advance diet Up with therapy  Labs reviewed this AM. Did require O2 while working with therapy, 94% on room air in the chair, no chest pain or SOB.  Likely de-conditioning. Patient has had a BM. Re-start Plavix on discharge. Will obtain COVID test today. Plan for discharge to SNF today.  DVT Prophylaxis - Lovenox, Foot Pumps and TED hose Non-weightbearing to the right arm.  Raquel Sorah Falkenstein, PA-C [facility] 08/29/2019, 7:46 AM

## 2019-09-06 ENCOUNTER — Inpatient Hospital Stay (HOSPITAL_COMMUNITY): Payer: Medicare Other

## 2019-09-06 ENCOUNTER — Emergency Department (HOSPITAL_COMMUNITY): Payer: Medicare Other

## 2019-09-06 ENCOUNTER — Other Ambulatory Visit: Payer: Self-pay

## 2019-09-06 ENCOUNTER — Inpatient Hospital Stay (HOSPITAL_COMMUNITY)
Admission: EM | Admit: 2019-09-06 | Discharge: 2019-09-12 | DRG: 871 | Disposition: A | Discharge status: Skilled Nursing Facility | Payer: Medicare Other | Source: Skilled Nursing Facility | Attending: Internal Medicine | Admitting: Internal Medicine

## 2019-09-06 ENCOUNTER — Encounter (HOSPITAL_COMMUNITY): Payer: Self-pay

## 2019-09-06 DIAGNOSIS — K219 Gastro-esophageal reflux disease without esophagitis: Secondary | ICD-10-CM | POA: Diagnosis present

## 2019-09-06 DIAGNOSIS — F17210 Nicotine dependence, cigarettes, uncomplicated: Secondary | ICD-10-CM | POA: Diagnosis present

## 2019-09-06 DIAGNOSIS — B961 Klebsiella pneumoniae [K. pneumoniae] as the cause of diseases classified elsewhere: Secondary | ICD-10-CM | POA: Diagnosis present

## 2019-09-06 DIAGNOSIS — Y92129 Unspecified place in nursing home as the place of occurrence of the external cause: Secondary | ICD-10-CM

## 2019-09-06 DIAGNOSIS — I2510 Atherosclerotic heart disease of native coronary artery without angina pectoris: Secondary | ICD-10-CM | POA: Diagnosis present

## 2019-09-06 DIAGNOSIS — Z20822 Contact with and (suspected) exposure to covid-19: Secondary | ICD-10-CM | POA: Diagnosis present

## 2019-09-06 DIAGNOSIS — E876 Hypokalemia: Secondary | ICD-10-CM

## 2019-09-06 DIAGNOSIS — M810 Age-related osteoporosis without current pathological fracture: Secondary | ICD-10-CM | POA: Diagnosis present

## 2019-09-06 DIAGNOSIS — E1165 Type 2 diabetes mellitus with hyperglycemia: Secondary | ICD-10-CM | POA: Diagnosis present

## 2019-09-06 DIAGNOSIS — Z886 Allergy status to analgesic agent status: Secondary | ICD-10-CM | POA: Diagnosis not present

## 2019-09-06 DIAGNOSIS — Z8261 Family history of arthritis: Secondary | ICD-10-CM

## 2019-09-06 DIAGNOSIS — D62 Acute posthemorrhagic anemia: Secondary | ICD-10-CM | POA: Diagnosis present

## 2019-09-06 DIAGNOSIS — E785 Hyperlipidemia, unspecified: Secondary | ICD-10-CM | POA: Diagnosis present

## 2019-09-06 DIAGNOSIS — G9340 Encephalopathy, unspecified: Secondary | ICD-10-CM

## 2019-09-06 DIAGNOSIS — R7881 Bacteremia: Secondary | ICD-10-CM | POA: Diagnosis not present

## 2019-09-06 DIAGNOSIS — W19XXXA Unspecified fall, initial encounter: Secondary | ICD-10-CM

## 2019-09-06 DIAGNOSIS — Z811 Family history of alcohol abuse and dependence: Secondary | ICD-10-CM

## 2019-09-06 DIAGNOSIS — E872 Acidosis: Secondary | ICD-10-CM | POA: Diagnosis present

## 2019-09-06 DIAGNOSIS — Z8249 Family history of ischemic heart disease and other diseases of the circulatory system: Secondary | ICD-10-CM

## 2019-09-06 DIAGNOSIS — R748 Abnormal levels of other serum enzymes: Secondary | ICD-10-CM | POA: Diagnosis present

## 2019-09-06 DIAGNOSIS — Z85828 Personal history of other malignant neoplasm of skin: Secondary | ICD-10-CM

## 2019-09-06 DIAGNOSIS — E669 Obesity, unspecified: Secondary | ICD-10-CM | POA: Diagnosis present

## 2019-09-06 DIAGNOSIS — Z7984 Long term (current) use of oral hypoglycemic drugs: Secondary | ICD-10-CM

## 2019-09-06 DIAGNOSIS — Z888 Allergy status to other drugs, medicaments and biological substances status: Secondary | ICD-10-CM

## 2019-09-06 DIAGNOSIS — I110 Hypertensive heart disease with heart failure: Secondary | ICD-10-CM | POA: Diagnosis present

## 2019-09-06 DIAGNOSIS — R4182 Altered mental status, unspecified: Secondary | ICD-10-CM | POA: Diagnosis present

## 2019-09-06 DIAGNOSIS — E039 Hypothyroidism, unspecified: Secondary | ICD-10-CM | POA: Diagnosis present

## 2019-09-06 DIAGNOSIS — R509 Fever, unspecified: Secondary | ICD-10-CM

## 2019-09-06 DIAGNOSIS — Z7982 Long term (current) use of aspirin: Secondary | ICD-10-CM

## 2019-09-06 DIAGNOSIS — Z96611 Presence of right artificial shoulder joint: Secondary | ICD-10-CM | POA: Diagnosis present

## 2019-09-06 DIAGNOSIS — J449 Chronic obstructive pulmonary disease, unspecified: Secondary | ICD-10-CM | POA: Diagnosis present

## 2019-09-06 DIAGNOSIS — A4159 Other Gram-negative sepsis: Secondary | ICD-10-CM | POA: Diagnosis present

## 2019-09-06 DIAGNOSIS — Z79890 Hormone replacement therapy (postmenopausal): Secondary | ICD-10-CM

## 2019-09-06 DIAGNOSIS — Z515 Encounter for palliative care: Secondary | ICD-10-CM | POA: Diagnosis not present

## 2019-09-06 DIAGNOSIS — F0150 Vascular dementia without behavioral disturbance: Secondary | ICD-10-CM | POA: Diagnosis present

## 2019-09-06 DIAGNOSIS — I480 Paroxysmal atrial fibrillation: Secondary | ICD-10-CM | POA: Diagnosis present

## 2019-09-06 DIAGNOSIS — F329 Major depressive disorder, single episode, unspecified: Secondary | ICD-10-CM | POA: Diagnosis present

## 2019-09-06 DIAGNOSIS — Z6831 Body mass index (BMI) 31.0-31.9, adult: Secondary | ICD-10-CM

## 2019-09-06 DIAGNOSIS — G40909 Epilepsy, unspecified, not intractable, without status epilepticus: Secondary | ICD-10-CM | POA: Diagnosis present

## 2019-09-06 DIAGNOSIS — D72819 Decreased white blood cell count, unspecified: Secondary | ICD-10-CM | POA: Diagnosis not present

## 2019-09-06 DIAGNOSIS — Z825 Family history of asthma and other chronic lower respiratory diseases: Secondary | ICD-10-CM

## 2019-09-06 DIAGNOSIS — Z8673 Personal history of transient ischemic attack (TIA), and cerebral infarction without residual deficits: Secondary | ICD-10-CM | POA: Diagnosis not present

## 2019-09-06 DIAGNOSIS — R7989 Other specified abnormal findings of blood chemistry: Secondary | ICD-10-CM | POA: Diagnosis not present

## 2019-09-06 DIAGNOSIS — Z91041 Radiographic dye allergy status: Secondary | ICD-10-CM | POA: Diagnosis not present

## 2019-09-06 DIAGNOSIS — Z823 Family history of stroke: Secondary | ICD-10-CM

## 2019-09-06 DIAGNOSIS — D649 Anemia, unspecified: Secondary | ICD-10-CM | POA: Diagnosis not present

## 2019-09-06 DIAGNOSIS — I5032 Chronic diastolic (congestive) heart failure: Secondary | ICD-10-CM | POA: Diagnosis present

## 2019-09-06 DIAGNOSIS — A419 Sepsis, unspecified organism: Secondary | ICD-10-CM | POA: Diagnosis not present

## 2019-09-06 DIAGNOSIS — Z7902 Long term (current) use of antithrombotics/antiplatelets: Secondary | ICD-10-CM

## 2019-09-06 DIAGNOSIS — Z66 Do not resuscitate: Secondary | ICD-10-CM | POA: Diagnosis present

## 2019-09-06 DIAGNOSIS — Z885 Allergy status to narcotic agent status: Secondary | ICD-10-CM

## 2019-09-06 DIAGNOSIS — R6520 Severe sepsis without septic shock: Secondary | ICD-10-CM | POA: Diagnosis not present

## 2019-09-06 DIAGNOSIS — M8949 Other hypertrophic osteoarthropathy, multiple sites: Secondary | ICD-10-CM | POA: Diagnosis present

## 2019-09-06 DIAGNOSIS — G9341 Metabolic encephalopathy: Secondary | ICD-10-CM | POA: Diagnosis present

## 2019-09-06 DIAGNOSIS — Z79899 Other long term (current) drug therapy: Secondary | ICD-10-CM

## 2019-09-06 DIAGNOSIS — Z833 Family history of diabetes mellitus: Secondary | ICD-10-CM

## 2019-09-06 DIAGNOSIS — Z818 Family history of other mental and behavioral disorders: Secondary | ICD-10-CM

## 2019-09-06 DIAGNOSIS — Z91018 Allergy to other foods: Secondary | ICD-10-CM

## 2019-09-06 DIAGNOSIS — R519 Headache, unspecified: Secondary | ICD-10-CM | POA: Diagnosis present

## 2019-09-06 DIAGNOSIS — R32 Unspecified urinary incontinence: Secondary | ICD-10-CM | POA: Diagnosis present

## 2019-09-06 DIAGNOSIS — Z803 Family history of malignant neoplasm of breast: Secondary | ICD-10-CM

## 2019-09-06 DIAGNOSIS — Z813 Family history of other psychoactive substance abuse and dependence: Secondary | ICD-10-CM

## 2019-09-06 DIAGNOSIS — E1140 Type 2 diabetes mellitus with diabetic neuropathy, unspecified: Secondary | ICD-10-CM | POA: Diagnosis present

## 2019-09-06 DIAGNOSIS — R6510 Systemic inflammatory response syndrome (SIRS) of non-infectious origin without acute organ dysfunction: Secondary | ICD-10-CM

## 2019-09-06 DIAGNOSIS — Z9889 Other specified postprocedural states: Secondary | ICD-10-CM

## 2019-09-06 DIAGNOSIS — S80211A Abrasion, right knee, initial encounter: Secondary | ICD-10-CM | POA: Diagnosis present

## 2019-09-06 LAB — URINALYSIS, ROUTINE W REFLEX MICROSCOPIC
Bilirubin Urine: NEGATIVE
Glucose, UA: NEGATIVE mg/dL
Hgb urine dipstick: NEGATIVE
Ketones, ur: 5 mg/dL — AB
Leukocytes,Ua: NEGATIVE
Nitrite: NEGATIVE
Protein, ur: NEGATIVE mg/dL
Specific Gravity, Urine: 1.019 (ref 1.005–1.030)
pH: 6 (ref 5.0–8.0)

## 2019-09-06 LAB — COMPREHENSIVE METABOLIC PANEL
ALT: 46 U/L — ABNORMAL HIGH (ref 0–44)
AST: 51 U/L — ABNORMAL HIGH (ref 15–41)
Albumin: 2.6 g/dL — ABNORMAL LOW (ref 3.5–5.0)
Alkaline Phosphatase: 167 U/L — ABNORMAL HIGH (ref 38–126)
Anion gap: 11 (ref 5–15)
BUN: 14 mg/dL (ref 8–23)
CO2: 21 mmol/L — ABNORMAL LOW (ref 22–32)
Calcium: 8.1 mg/dL — ABNORMAL LOW (ref 8.9–10.3)
Chloride: 105 mmol/L (ref 98–111)
Creatinine, Ser: 0.76 mg/dL (ref 0.44–1.00)
GFR calc Af Amer: >60 mL/min (ref >60)
GFR calc non Af Amer: >60 mL/min (ref >60)
Glucose, Bld: 172 mg/dL — ABNORMAL HIGH (ref 70–99)
Potassium: 3.2 mmol/L — ABNORMAL LOW (ref 3.5–5.1)
Sodium: 137 mmol/L (ref 135–145)
Total Bilirubin: 1.4 mg/dL — ABNORMAL HIGH (ref 0.3–1.2)
Total Protein: 5.8 g/dL — ABNORMAL LOW (ref 6.5–8.1)

## 2019-09-06 LAB — RETICULOCYTES
Immature Retic Fract: 14.3 % (ref 2.3–15.9)
RBC.: 3.28 MIL/uL — ABNORMAL LOW (ref 3.87–5.11)
Retic Count, Absolute: 68.2 10*3/uL (ref 19.0–186.0)
Retic Ct Pct: 2.1 % (ref 0.4–3.1)

## 2019-09-06 LAB — CBC WITH DIFFERENTIAL/PLATELET
Abs Immature Granulocytes: 0.07 10*3/uL (ref 0.00–0.07)
Basophils Absolute: 0 10*3/uL (ref 0.0–0.1)
Basophils Relative: 0 %
Eosinophils Absolute: 0.1 10*3/uL (ref 0.0–0.5)
Eosinophils Relative: 1 %
HCT: 31.5 % — ABNORMAL LOW (ref 36.0–46.0)
Hemoglobin: 9.9 g/dL — ABNORMAL LOW (ref 12.0–15.0)
Immature Granulocytes: 1 %
Lymphocytes Relative: 3 %
Lymphs Abs: 0.3 10*3/uL — ABNORMAL LOW (ref 0.7–4.0)
MCH: 29.1 pg (ref 26.0–34.0)
MCHC: 31.4 g/dL (ref 30.0–36.0)
MCV: 92.6 fL (ref 80.0–100.0)
Monocytes Absolute: 0.7 10*3/uL (ref 0.1–1.0)
Monocytes Relative: 5 %
Neutro Abs: 11.8 10*3/uL — ABNORMAL HIGH (ref 1.7–7.7)
Neutrophils Relative %: 90 %
Platelets: 283 10*3/uL (ref 150–400)
RBC: 3.4 MIL/uL — ABNORMAL LOW (ref 3.87–5.11)
RDW: 14.3 % (ref 11.5–15.5)
WBC: 13 10*3/uL — ABNORMAL HIGH (ref 4.0–10.5)
nRBC: 0 % (ref 0.0–0.2)

## 2019-09-06 LAB — PROTIME-INR
INR: 1.2 (ref 0.8–1.2)
Prothrombin Time: 15.1 seconds (ref 11.4–15.2)

## 2019-09-06 LAB — LIPASE, BLOOD: Lipase: 16 U/L (ref 11–51)

## 2019-09-06 LAB — APTT: aPTT: 31 seconds (ref 24–36)

## 2019-09-06 LAB — MAGNESIUM: Magnesium: 1.7 mg/dL (ref 1.7–2.4)

## 2019-09-06 LAB — CBG MONITORING, ED
Glucose-Capillary: 172 mg/dL — ABNORMAL HIGH (ref 70–99)
Glucose-Capillary: 161 mg/dL — ABNORMAL HIGH (ref 70–99)

## 2019-09-06 LAB — LACTIC ACID, PLASMA: Lactic Acid, Venous: 1.5 mmol/L (ref 0.5–1.9)

## 2019-09-06 LAB — TSH: TSH: 0.438 u[IU]/mL (ref 0.350–4.500)

## 2019-09-06 LAB — SARS CORONAVIRUS 2 BY RT PCR (HOSPITAL ORDER, PERFORMED IN ~~LOC~~ HOSPITAL LAB): SARS Coronavirus 2: NEGATIVE

## 2019-09-06 MED ORDER — POTASSIUM CHLORIDE 10 MEQ/100ML IV SOLN
10.0000 meq | INTRAVENOUS | Status: AC
  Administered 2019-09-06 (×2): 10 meq via INTRAVENOUS
  Filled 2019-09-06 (×2): qty 100

## 2019-09-06 MED ORDER — LACTATED RINGERS IV SOLN: INTRAVENOUS | Status: AC

## 2019-09-06 MED ORDER — ACETAMINOPHEN 650 MG RE SUPP: 650.0000 mg | Freq: Four times a day (QID) | RECTAL | Status: DC | PRN

## 2019-09-06 MED ORDER — ACETAMINOPHEN 650 MG RE SUPP
650.0000 mg | Freq: Once | RECTAL | Status: AC
  Administered 2019-09-06: 650 mg via RECTAL
  Filled 2019-09-06: qty 1

## 2019-09-06 MED ORDER — SODIUM CHLORIDE 0.9 % IV SOLN
2.0000 g | INTRAVENOUS | Status: DC
  Administered 2019-09-06 – 2019-09-07 (×2): 2 g via INTRAVENOUS
  Filled 2019-09-06 (×8): qty 2000

## 2019-09-06 MED ORDER — MIDAZOLAM HCL 2 MG/2ML IJ SOLN: 2.0000 mg | Freq: Once | INTRAMUSCULAR | Status: DC

## 2019-09-06 MED ORDER — ACETAMINOPHEN 325 MG PO TABS
650.0000 mg | ORAL_TABLET | ORAL | Status: DC | PRN
  Administered 2019-09-08 – 2019-09-11 (×2): 650 mg via ORAL
  Filled 2019-09-06: qty 2

## 2019-09-06 MED ORDER — SODIUM CHLORIDE 0.9 % IV SOLN
1.0000 g | Freq: Once | INTRAVENOUS | Status: AC
  Administered 2019-09-06: 1 g via INTRAVENOUS
  Filled 2019-09-06: qty 10

## 2019-09-06 MED ORDER — LACTATED RINGERS IV SOLN: INTRAVENOUS | Status: DC

## 2019-09-06 MED ORDER — DEXAMETHASONE SODIUM PHOSPHATE 10 MG/ML IJ SOLN: 10.0000 mg | Freq: Four times a day (QID) | INTRAMUSCULAR | Status: DC

## 2019-09-06 MED ORDER — ACETAMINOPHEN 650 MG RE SUPP
650.0000 mg | RECTAL | Status: DC | PRN
  Administered 2019-09-06 – 2019-09-08 (×2): 650 mg via RECTAL
  Filled 2019-09-06 (×2): qty 1

## 2019-09-06 MED ORDER — INSULIN ASPART 100 UNIT/ML ~~LOC~~ SOLN
0.0000 [IU] | SUBCUTANEOUS | Status: DC
  Administered 2019-09-06: 2 [IU] via SUBCUTANEOUS
  Administered 2019-09-07 – 2019-09-08 (×3): 1 [IU] via SUBCUTANEOUS
  Administered 2019-09-08: 3 [IU] via SUBCUTANEOUS
  Administered 2019-09-08 (×2): 1 [IU] via SUBCUTANEOUS
  Administered 2019-09-08: 2 [IU] via SUBCUTANEOUS
  Administered 2019-09-09 (×3): 1 [IU] via SUBCUTANEOUS
  Administered 2019-09-10: 2 [IU] via SUBCUTANEOUS
  Administered 2019-09-11: 1 [IU] via SUBCUTANEOUS
  Administered 2019-09-11: 2 [IU] via SUBCUTANEOUS
  Administered 2019-09-11: 1 [IU] via SUBCUTANEOUS
  Administered 2019-09-11: 3 [IU] via SUBCUTANEOUS
  Administered 2019-09-12 (×2): 1 [IU] via SUBCUTANEOUS
  Administered 2019-09-12: 2 [IU] via SUBCUTANEOUS

## 2019-09-06 MED ORDER — SODIUM CHLORIDE 0.9 % IV SOLN
1.0000 g | INTRAVENOUS | Status: DC
  Administered 2019-09-06: 1 g via INTRAVENOUS
  Filled 2019-09-06: qty 10

## 2019-09-06 MED ORDER — LACTATED RINGERS IV BOLUS (SEPSIS)
1000.0000 mL | Freq: Once | INTRAVENOUS | Status: AC
  Administered 2019-09-06: 1000 mL via INTRAVENOUS

## 2019-09-06 MED ORDER — VANCOMYCIN HCL 1750 MG/350ML IV SOLN
1750.0000 mg | Freq: Once | INTRAVENOUS | Status: AC
  Administered 2019-09-06: 1750 mg via INTRAVENOUS
  Filled 2019-09-06: qty 350

## 2019-09-06 MED ORDER — ACETAMINOPHEN 325 MG PO TABS: 650.0000 mg | ORAL_TABLET | Freq: Four times a day (QID) | ORAL | Status: DC | PRN

## 2019-09-06 MED ORDER — SODIUM CHLORIDE 0.9 % IV SOLN
2.0000 g | Freq: Two times a day (BID) | INTRAVENOUS | Status: DC
  Administered 2019-09-07: 2 g via INTRAVENOUS
  Filled 2019-09-06: qty 2
  Filled 2019-09-06: qty 20

## 2019-09-06 MED ORDER — VANCOMYCIN HCL 750 MG/150ML IV SOLN
750.0000 mg | Freq: Two times a day (BID) | INTRAVENOUS | Status: DC
  Administered 2019-09-07: 750 mg via INTRAVENOUS
  Filled 2019-09-06: qty 150

## 2019-09-06 NOTE — H&P (Addendum)
History and Physical    Samantha Aguirre QIW:[id number] DOB: 1945-10-21 DOA: 09/06/2019  PCP: Tracie Harrier, MD Patient [facility]  Chief Complaint: Fever, AMS  HPI: Samantha Aguirre is a 74 y.o. female with medical history significant of paroxysmal A. fib, chronic diastolic CHF, COPD, asthma, CAD, noninsulin-dependent type II diabetes, hypertension, hyperlipidemia, seizure disorder, CVA, hypothyroidism, GERD, recent reverse right total shoulder arthroplasty on 08/25/2019 presenting to the ED via EMS from [facility] for evaluation of fever and altered mental status.  Facility reported to EMS that patient was last known well on Friday.  It is reported that she had an unwitnessed fall on Saturday.  She had an abrasion noted to her knee, unknown head injury.  Facility noted oral temperature of 101 F today when she had a negative Covid antigen test today.  EMS administered 1 L normal saline prior to arrival.  No history could be obtained from the patient due to her altered mental status.  ED Course: Febrile with temperature 102.7 F.  Tachypneic with respiratory rate in the 20s.  Not tachycardic or hypotensive.  Not hypoxic.  SARS-CoV-2 PCR test negative.  WBC count 13.0.  Hemoglobin 9.9, was in the 10-11 range on recent labs.  Platelet count normal.  Sodium 137, potassium 3.2, chloride 105, bicarb 21, BUN 14, creatinine 0.7, and glucose 172.  LFTs mildly elevated (AST 51, ALT 46, alk phos 167, and T bili 1.4).  Lipase normal.  Lactic acid normal.  INR 1.2.  UA not suggestive of infection.  Urine culture pending.  Blood culture x2 pending.  Chest x-ray not suggestive of pneumonia.  X-ray of right knee negative for acute fracture or malalignment.  Small joint effusion.  Head CT negative for acute intracranial abnormality.  CT abdomen pelvis negative for acute infectious source.  Status post cholecystectomy.  No biliary dilatation.  No focal liver abnormality.  Patient was given  vancomycin, ceftriaxone, potassium supplementation, Tylenol, and 1 L LR bolus.  ED provider to do LP due to suspicion for meningitis.   Review of Systems:  All systems reviewed and apart from history of presenting illness, are negative.  Past Medical History:  Diagnosis Date  . Acid reflux   . Arthritis    Bilateral knees, hands, feet  . Asthma   . Atrial fibrillation ([place])   . Cancer ([place])   . CHF (congestive heart failure) (HCC)    Diastolic CHF  . COPD (chronic obstructive pulmonary disease) ([place])   . Coronary artery disease   . Diabetes mellitus without complication ([place])   . Frequent headaches   . GI bleed   . HLD (hyperlipidemia)   . Hypertension   . Seizures ([place])   . Skin cancer 2015   Suspected basal cell carcinoma on nose, surgically removed  . Stroke ([place]) 04/2017  . Thyroid disease   . Tremors of nervous system     Past Surgical History:  Procedure Laterality Date  . ABDOMINAL HYSTERECTOMY  1976  . APPENDECTOMY  1980  . BACK SURGERY    . cardiac stents    . CAROTID ENDARTERECTOMY    . CHOLECYSTECTOMY  1980  . COLONOSCOPY N/A 02/20/2017   Procedure: COLONOSCOPY;  Surgeon: Lin Landsman, MD;  Location: [facility] ENDOSCOPY;  Service: Gastroenterology;  Laterality: N/A;  . ESOPHAGOGASTRODUODENOSCOPY N/A 02/20/2017   Procedure: ESOPHAGOGASTRODUODENOSCOPY (EGD);  Surgeon: Lin Landsman, MD;  Location: [facility] ENDOSCOPY;  Service: Gastroenterology;  Laterality: N/A;  . ESOPHAGOGASTRODUODENOSCOPY (EGD) WITH PROPOFOL N/A 07/08/2017  Procedure: ESOPHAGOGASTRODUODENOSCOPY (EGD) WITH PROPOFOL;  Surgeon: [name], MD;  Location: [facility] ENDOSCOPY;  Service: Endoscopy;  Laterality: N/A;  . [place]  . GIVENS CAPSULE STUDY N/A 07/09/2017   Procedure: GIVENS CAPSULE STUDY;  Surgeon: Jonathon Bellows, MD;  Location: [facility] ENDOSCOPY;  Service: Gastroenterology;  Laterality: N/A;  . KNEE SURGERY     left  . KYPHOPLASTY N/A 03/16/2018   Procedure: KYPHOPLASTY, L5;   Surgeon: Hessie Knows, MD;  Location: [facility] ORS;  Service: Orthopedics;  Laterality: N/A;  . REVERSE SHOULDER ARTHROPLASTY Right 08/25/2019   Procedure: REVERSE SHOULDER ARTHROPLASTY;  Surgeon: Corky Mull, MD;  Location: [facility] ORS;  Service: Orthopedics;  Laterality: Right;  . ROTATOR CUFF REPAIR     right  . TONSILLECTOMY  1950     reports that she has been smoking cigarettes. She has a 53.00 pack-year smoking history. She has never used smokeless tobacco. She reports that she does not drink alcohol and does not use drugs.  Allergies  Allergen Reactions  . Ivp Dye [Iodinated Diagnostic Agents] Itching    Pt injected with IV contrast.  5 min after injection pt complained of itching behind ear and on her abd.  . Methotrexate Rash  . Morphine And Related Other (See Comments)    "stops my breathing" NEAR-RESPIRATORY ARREST  . Mushroom Extract Complex Anaphylaxis    Made patient "deathly sick"  . Atenolol Other (See Comments)    "MD took me off of it bc it was doing something wrong" Made patient HYPOTENSIVE  . Percocet [Oxycodone-Acetaminophen] Nausea And Vomiting and Other (See Comments)    Stomach pains  . Percodan [Oxycodone-Aspirin] Nausea And Vomiting and Other (See Comments)    Stomach pains  . Tramadol Nausea Only  . Verapamil Other (See Comments)    " MD told me this was screwing me up" MAKES PATIENT HYPOTENSIVE  . Oxycodone Hcl     Family History  Problem Relation Age of Onset  . Breast cancer Mother   . Heart disease Mother   . Stroke Mother   . Cancer Mother   . COPD Mother   . Diabetes Mother   . Heart disease Father   . Diabetes Father   . Stroke Father   . Alcohol abuse Sister   . Drug abuse Sister   . Stroke Sister   . Cancer Sister   . Mental illness Sister   . Heart disease Brother   . Arthritis Brother   . Diabetes Brother   . Heart disease Maternal Grandfather   . Heart disease Paternal Grandfather     Prior to Admission medications    Medication Sig Start Date End Date Taking? Authorizing Provider  albuterol (PROVENTIL HFA;VENTOLIN HFA) 108 (90 Base) MCG/ACT inhaler Inhale 2 puffs into the lungs every 6 (six) hours as needed for wheezing or shortness of breath. 10/08/16   Karamalegos, Devonne Doughty, DO  aspirin (ASPIRIN CHILDRENS) 81 MG chewable tablet Chew 1 tablet (81 mg total) by mouth daily. 03/17/18   Bettey Costa, MD  atorvastatin (LIPITOR) 80 MG tablet Take 1 tablet (80 mg total) by mouth daily at 6 PM. 09/24/16   Karamalegos, Devonne Doughty, DO  clopidogrel (PLAVIX) 75 MG tablet Take 75 mg by mouth daily. 10/07/18   [provider]  DULoxetine (CYMBALTA) 60 MG capsule Take 1 capsule (60 mg total) by mouth at bedtime. Patient taking differently: Take 60 mg by mouth daily.  09/24/16   Karamalegos, Devonne Doughty, DO  ferrous sulfate 325 (  65 FE) MG EC tablet Take 1 tablet (325 mg total) by mouth 2 (two) times daily with a meal. Patient taking differently: Take 325 mg by mouth daily.  02/21/17   Hillary Bow, MD  fluticasone (FLONASE) 50 MCG/ACT nasal spray Place 1 spray into both nostrils 2 (two) times daily as needed for allergies.  05/24/19   [provider]  furosemide (LASIX) 20 MG tablet Take 20 mg by mouth daily.  10/07/18   [provider]  gabapentin (NEURONTIN) 800 MG tablet Take 1 tablet (800 mg total) by mouth 2 (two) times daily. 09/24/16   Karamalegos, Devonne Doughty, DO  glipiZIDE-metformin (METAGLIP) 5-500 MG tablet Take 1 tablet by mouth 2 (two) times daily before a meal. Patient taking differently: Take 1-2 tablets by mouth See admin instructions. Take 2 tablets by mouth every morning before breakfast and take 1 tablet by mouth every evening before dinner 04/02/17   Parks Ranger, Devonne Doughty, DO  levETIRAcetam (KEPPRA) 500 MG tablet Take 1 tablet (500 mg total) by mouth 2 (two) times daily. 09/19/15   Epifanio Lesches, MD  levothyroxine (SYNTHROID) 125 MCG tablet Take 125 mcg by mouth daily before  breakfast. 06/16/19   [provider]  nitroGLYCERIN (NITROSTAT) 0.4 MG SL tablet Place 1 tablet (0.4 mg total) under the tongue every 5 (five) minutes as needed for chest pain. 02/28/16   Bettey Costa, MD  omeprazole (PRILOSEC) 20 MG capsule Take 20 mg by mouth daily.  09/02/17   [provider]  topiramate (TOPAMAX) 25 MG tablet Take 25 mg by mouth 2 (two) times daily. 10/07/18   [provider]  traMADol (ULTRAM) 50 MG tablet Take 1 tablet (50 mg total) by mouth every 6 (six) hours. 08/29/19   Lattie Corns, PA-C  vitamin B-12 (CYANOCOBALAMIN) 1000 MCG tablet Take 1,000 mcg by mouth daily.    [provider]    Physical Exam: Vitals:   09/06/19 1600 09/06/19 1615 09/06/19 1630 09/06/19 1715  BP: (!) 110/94 (!) 124/98 (!) 131/55 (!) 138/49  Pulse:  81 80 78  Resp: 15 (!) 25 (!) 24 (!) 24  Temp: (!) 102.7 F (39.3 C)     TempSrc: Rectal     SpO2: 96% 96% 95% 98%  Weight:      Height:        Physical Exam Constitutional:      General: She is not in acute distress.    Appearance: She is not diaphoretic.  HENT:     Head: Normocephalic and atraumatic.     Mouth/Throat:     Mouth: Mucous membranes are dry.  Eyes:     Conjunctiva/sclera: Conjunctivae normal.  Cardiovascular:     Rate and Rhythm: Normal rate and regular rhythm.     Pulses: Normal pulses.  Pulmonary:     Effort: No respiratory distress.     Breath sounds: Normal breath sounds. No wheezing or rales.  Abdominal:     General: Bowel sounds are normal. There is no distension.     Palpations: Abdomen is soft.     Tenderness: There is no abdominal tenderness. There is no guarding.  Musculoskeletal:        General: No swelling or tenderness.     Cervical back: Normal range of motion and neck supple.     Comments: Right shoulder surgical incision sites: Staples in place.  No erythema or drainage.  No obvious signs of infection.  Skin:    General: Skin is warm and dry.  Neurological:      Comments: Awake but not alert Only intermittently following commands Non-verbal     Labs on Admission: I have personally reviewed following labs and imaging studies  CBC: Recent Labs  Lab 09/06/19 1617  WBC 13.0*  NEUTROABS 11.8*  HGB 9.9*  HCT 31.5*  MCV 92.6  PLT 696   Basic Metabolic Panel: Recent Labs  Lab 09/06/19 1617  NA 137  K 3.2*  CL 105  CO2 21*  GLUCOSE 172*  BUN 14  CREATININE 0.76  CALCIUM 8.1*   GFR: Estimated Creatinine Clearance: 56.8 mL/min (by C-G formula based on SCr of 0.76 mg/dL). Liver Function Tests: Recent Labs  Lab 09/06/19 1617  AST 51*  ALT 46*  ALKPHOS 167*  BILITOT 1.4*  PROT 5.8*  ALBUMIN 2.6*   Recent Labs  Lab 09/06/19 1800  LIPASE 16   No results for input(s): AMMONIA in the last 168 hours. Coagulation Profile: Recent Labs  Lab 09/06/19 1617  INR 1.2   Cardiac Enzymes: No results for input(s): CKTOTAL, CKMB, CKMBINDEX, TROPONINI in the last 168 hours. BNP (last 3 results) No results for input(s): PROBNP in the last 8760 hours. HbA1C: No results for input(s): HGBA1C in the last 72 hours. CBG: Recent Labs  Lab 09/06/19 1518  GLUCAP 172*   Lipid Profile: No results for input(s): CHOL, HDL, LDLCALC, TRIG, CHOLHDL, LDLDIRECT in the last 72 hours. Thyroid Function Tests: No results for input(s): TSH, T4TOTAL, FREET4, T3FREE, THYROIDAB in the last 72 hours. Anemia Panel: No results for input(s): VITAMINB12, FOLATE, FERRITIN, TIBC, IRON, RETICCTPCT in the last 72 hours. Urine analysis:    Component Value Date/Time   COLORURINE AMBER (A) 09/06/2019 1618   APPEARANCEUR CLEAR 09/06/2019 1618   LABSPEC 1.019 09/06/2019 1618   PHURINE 6.0 09/06/2019 1618   GLUCOSEU NEGATIVE 09/06/2019 1618   HGBUR NEGATIVE 09/06/2019 1618   BILIRUBINUR NEGATIVE 09/06/2019 1618   KETONESUR 5 (A) 09/06/2019 1618   PROTEINUR NEGATIVE 09/06/2019 1618   NITRITE NEGATIVE 09/06/2019 1618   LEUKOCYTESUR NEGATIVE 09/06/2019 1618     Radiological Exams on Admission: CT ABDOMEN PELVIS WO CONTRAST  Result Date: 09/06/2019 CLINICAL DATA:  Abdominal pain and fevers with elevated LFTs EXAM: CT ABDOMEN AND PELVIS WITHOUT CONTRAST TECHNIQUE: Multidetector CT imaging of the abdomen and pelvis was performed following the standard protocol without IV contrast. COMPARISON:  07/15/2017 FINDINGS: Lower chest: No acute abnormality. Hepatobiliary: No focal liver abnormality is seen. Status post cholecystectomy. No biliary dilatation. Pancreas: Unremarkable. No pancreatic ductal dilatation or surrounding inflammatory changes. Spleen: Normal in size without focal abnormality. Adrenals/Urinary Tract: Adrenal glands are stable in appearance. Mild hyperplasia in the left adrenal gland is seen. The kidneys demonstrate a tiny nonobstructing stone in the lower pole of the right kidney. No obstructive changes are seen. The bladder is decompressed. Stomach/Bowel: Mild retained fecal material is noted within the colon. No obstructive or inflammatory changes are seen. The appendix is within normal limits. Small bowel and stomach are unremarkable. Vascular/Lymphatic: Aortic atherosclerosis. No enlarged abdominal or pelvic lymph nodes. Reproductive: Status post hysterectomy. No adnexal masses. Other: No abdominal wall hernia or abnormality. No abdominopelvic ascites. Musculoskeletal: Changes of prior vertebral augmentation are seen at L5. Chronic L1 compression deformity is seen. IMPRESSION: Chronic changes similar to that seen on the prior exam. No acute abnormality correspond with the patient's given clinical symptomatology is noted. Electronically Signed   By: Inez Catalina M.D.   On: 09/06/2019 17:48   CT Head Wo Contrast  Result Date: 09/06/2019 CLINICAL DATA:  Mental status changes. Unwitnessed fall, on blood thinners EXAM: CT HEAD WITHOUT CONTRAST TECHNIQUE: Contiguous axial images were obtained from the base of the skull through the vertex without  intravenous contrast. COMPARISON:  06/15/2019 FINDINGS: Brain: Old left parietal infarct, stable. Stable old cerebellar infarcts bilaterally. There is atrophy and chronic small vessel disease changes. No acute intracranial abnormality. Specifically, no hemorrhage, hydrocephalus, mass lesion, acute infarction, or significant intracranial injury. Vascular: No hyperdense vessel or unexpected calcification. Skull: No acute calvarial abnormality. Sinuses/Orbits: Visualized paranasal sinuses and mastoids clear. Orbital soft tissues unremarkable. Other: None IMPRESSION: Old left parietal and bilateral cerebellar infarcts. Atrophy, chronic microvascular disease. No acute intracranial abnormality. Electronically Signed   By: Rolm Baptise M.D.   On: 09/06/2019 17:46   DG Chest Port 1 View  Result Date: 09/06/2019 CLINICAL DATA:  Possible sepsis EXAM: PORTABLE CHEST 1 VIEW COMPARISON:  06/15/2019 FINDINGS: No new consolidation or edema. No pleural effusion. Similar cardiomediastinal contours. Interval right reverse shoulder arthroplasty. IMPRESSION: No acute process in the chest. Electronically Signed   By: Macy Mis M.D.   On: 09/06/2019 16:37   DG Knee Complete 4 Views Right  Result Date: 09/06/2019 CLINICAL DATA:  Fall EXAM: RIGHT KNEE - COMPLETE 4+ VIEW COMPARISON:  November 13, 2015 FINDINGS: No evidence of acute fracture. No malalignment. Likely small joint effusion, although obliquity of the lateral radiograph limits evaluation. Diffuse osteopenia. Joint spaces are relatively well maintained. Osteophytic spurring arising from bilateral femoral condyles. Superior patellar enthesophyte. Mild soft tissue swelling about the knee. Vascular calcifications. IMPRESSION: No evidence of acute fracture or malalignment. Small joint effusion. Electronically Signed   By: Margaretha Sheffield MD   On: 09/06/2019 16:50    EKG: Pending at this time.  Assessment/Plan Principal Problem:   AMS (altered mental  status) Active Problems:   Acute on chronic anemia   Fever   SIRS (systemic inflammatory response syndrome) (HCC)   Hypokalemia   Fever and altered mental status SIRS Suspicious for possible meningitis given no other clear infectious source on work-up done so far including UA, chest x-ray, and CT abdomen pelvis.  Head CT negative for acute intracranial abnormality.  Right shoulder surgical site without obvious signs of infection. Labs showing mild leukocytosis.  Lactic acid normal.  SARS-CoV-2 PCR test negative. -ED provider to do LP and send off labs for CSF analysis.  Start empiric therapy for acute bacterial meningitis including vancomycin, ceftriaxone, ampicillin, and dexamethasone.  Tylenol as needed for fevers.  Received IV fluid bolus, continue IV fluid hydration.  Blood culture x2 pending.  Urine culture pending.  Check B12, TSH, and ammonia levels.  ADDENDUM: CT of right shoulder has also been done and showing no obvious joint effusion or findings to suspect septic joint.  Unfortunately LP contraindicated at this time as patient is on Plavix. IR recommending contacting fluoroscopy in the morning to discuss whether LP can be done.  Continue to hold Plavix at this time.  Also since patient already received ceftriaxone in the ED several hours ago, no clinical benefit in giving dexamethasone at this time (discussed with pharmacy and ID physician).  Continue antibiotics at this time.  Acute on chronic normocytic anemia: Hemoglobin 9.9 and MCV 92.  Hemoglobin was in the 10-11 range on recent labs.  -Anemia panel, FOBT  Mild hypokalemia: Potassium 3.2. -Potassium repletion.  Check magnesium level and replete if low.  Continue to monitor electrolytes.  Elevated LFTs: LFTs mildly elevated (AST 51, ALT 46,  alk phos 167, and T bili 1.4).  History of prior cholecystectomy.  CT showing no focal liver abnormality or biliary dilatation. -Right upper quadrant ultrasound  Well-controlled  non-insulin-dependent type 2 diabetes: A1c 6.3 on 08/22/2019. -Sliding scale insulin sensitive every 4 hours.  Seizure disorder -Resume Keppra after pharmacy med rec is done  Hypothyroidism -Resume Synthroid after pharmacy med rec is done  DVT prophylaxis: SCDs at this time Code Status: DNR-confirmed by the patient's daughter. Family Communication: I have spoken to the patient's daughter and updated her over the phone. Disposition Plan: Status is: Inpatient  Remains inpatient appropriate because:Altered mental status, Ongoing diagnostic testing needed not appropriate for outpatient work up, IV treatments appropriate due to intensity of illness or inability to take PO and Inpatient level of care appropriate due to severity of illness   Dispo: The patient is from: Nursing home              Anticipated d/c is to: Nursing home              Anticipated d/c date is: > 3 days              Patient currently is not medically stable to d/c.  The medical decision making on this patient was of high complexity and the patient is at high risk for clinical deterioration, therefore this is a level 3 visit.  Shela Leff MD [facility]  If 7PM-7AM, please contact night-coverage [email or web address]  09/06/2019, 8:19 PM

## 2019-09-06 NOTE — ED Notes (Signed)
Pt to US at this time.

## 2019-09-06 NOTE — ED Notes (Signed)
RN was informed by PA Quentin Cornwall , that the pt will not be getting LP due to pt being on  Plavix which is a contraindication.  RN will continue to monitor

## 2019-09-06 NOTE — Progress Notes (Signed)
Pharmacy Antibiotic Note  Samantha Aguirre is a 73 y.o. female admitted on 09/06/2019 with sepsis.  The patient also has ceftriaxone 1g q24h ordered. Pharmacy has been consulted for vancomycin dosing.  The patient recently had shoulder arthroplasty on 08/25/19. Today the patient presents with a Tmax 102.7 F, WBC of 13 and a BP of 138/49. The patient's renal function is at baseline.   Plan: Vancomycin IV 1750 mg x1 dose then 750 mg q12h thereafter Order vancomycin troughs at steady state when clinically indicated Target a goal trough of 15-20 mcg/mL Monitor renal function, clinical status, cultures and length of therapy Deescalate therapy as clinically appropriate  Height: 5\' 1"  (154.9 cm) Weight: 76.2 kg (167 lb 15.9 oz) IBW/kg (Calculated) : 47.8  Temp (24hrs), Avg:100.7 F (38.2 C), Min:98.7 F (37.1 C), Max:102.7 F (39.3 C)  Recent Labs  Lab 09/06/19 1617  WBC 13.0*  CREATININE 0.76  LATICACIDVEN 1.5    Estimated Creatinine Clearance: 56.8 mL/min (by C-G formula based on SCr of 0.76 mg/dL).    Allergies  Allergen Reactions  . Ivp Dye [Iodinated Diagnostic Agents] Itching    Pt injected with IV contrast.  5 min after injection pt complained of itching behind ear and on her abd.  . Methotrexate Rash  . Morphine And Related Other (See Comments)    "stops my breathing" NEAR-RESPIRATORY ARREST  . Mushroom Extract Complex Anaphylaxis    Made patient "deathly sick"  . Atenolol Other (See Comments)    "MD took me off of it bc it was doing something wrong" Made patient HYPOTENSIVE  . Percocet [Oxycodone-Acetaminophen] Nausea And Vomiting and Other (See Comments)    Stomach pains  . Percodan [Oxycodone-Aspirin] Nausea And Vomiting and Other (See Comments)    Stomach pains  . Tramadol Nausea Only  . Verapamil Other (See Comments)    " MD told me this was screwing me up" MAKES PATIENT HYPOTENSIVE  . Oxycodone Hcl     Antimicrobials this admission: 8/24 vancomycin  >> 8/24 ceftriaxone >>  Microbiology results: 8/24 BCx: sent 8/24 UCx: sent  Thank you for allowing pharmacy to be a part of this patient's care.  Shauna Hugh, PharmD, [place]  PGY-1 Pharmacy Resident 09/06/2019 7:27 PM  Please check [email or web address] for unit-specific pharmacy phone numbers.

## 2019-09-06 NOTE — ED Triage Notes (Addendum)
Pt presents to ED from [facility] where she resides for rehab after right shoulder sx. Staff called EMS today reporting AMS and fever. LKW Friday- Pt normally A&Ox1, currently lethargic and only oriented to person with minimal verbal response. According to facility she did have fall Saturday and injured right knee. Unknown if she hit head at that time. Pt does take plavix. Pt oral temp at facility 101.   18G LAC- 1,000ML NS given PTA RR [id number] HR 90 spo2 95% RA etco2 28-31 CBG 196

## 2019-09-06 NOTE — ED Provider Notes (Addendum)
[facility] EMERGENCY DEPARTMENT Provider Note   CSN: [id number] Arrival date & time: 09/06/19  1514     History Chief Complaint  Patient presents with  . Altered Mental Status  . Fall    Samantha Aguirre is a 74 y.o. female w PMHx PAF, COPD, CHF, CVA, T2DM, seizure d/o, thyroid disease, s/p recent total reverse right shoulder arthroplasty, presenting to the ED via EMS from [facility] with AMS. Per EMS, facility reports patient last known normal was Friday. It is reported she had some unwitnessed fall on Saturday. She had an abrasion noted to her knee, unknown head injury. Facility noted oral temp of 101 today, had negative COVID antigen test today.  EMS administered 1L NS prior to arrival, did not note any other VS abnormalities.  LEVEL 5 CAVEAT 2/t AMS.   The history is provided by the EMS personnel. The history is limited by the condition of the patient.       Past Medical History:  Diagnosis Date  . Acid reflux   . Arthritis    Bilateral knees, hands, feet  . Asthma   . Atrial fibrillation ([place])   . Cancer ([place])   . CHF (congestive heart failure) (HCC)    Diastolic CHF  . COPD (chronic obstructive pulmonary disease) ([place])   . Coronary artery disease   . Diabetes mellitus without complication ([place])   . Frequent headaches   . GI bleed   . HLD (hyperlipidemia)   . Hypertension   . Seizures ([place])   . Skin cancer 2015   Suspected basal cell carcinoma on nose, surgically removed  . Stroke ([place]) 04/2017  . Thyroid disease   . Tremors of nervous system     Patient Active Problem List   Diagnosis Date Noted  . AMS (altered mental status) 09/06/2019  . Fever 09/06/2019  . SIRS (systemic inflammatory response syndrome) ([place]) 09/06/2019  . Hypokalemia 09/06/2019  . Status post reverse total shoulder replacement, 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  . Stroke ([place]) 06/13/2018  . Hypotension 03/18/2018  . Malnutrition of moderate degree 03/16/2018  . Closed fracture  of lumbar vertebral body ([place]) 03/15/2018  . Speaking difficulty 03/13/2018  . TIA (transient ischemic attack) 09/22/2017  . PAF (paroxysmal atrial fibrillation) ([place])   . Acute CVA (cerebrovascular accident) ([place]) 07/30/2017  . GI bleed 07/08/2017  . Acute on chronic anemia 07/08/2017  . Angiodysplasia of stomach and duodenum with hemorrhage   . CVA (cerebral vascular accident) ([place]) 07/06/2017  . Stroke (cerebrum) ([place])   . Goals of care, counseling/discussion   . Palliative care encounter   . COPD exacerbation ([place]) 04/03/2017  . Flu 03/27/2017  . Iron deficiency anemia due to chronic blood loss   . Migraine 02/13/2017  . Cataracts, bilateral 01/15/2017  . Chronic venous insufficiency 10/06/2016  . Recurrent major depressive disorder, in partial remission ([place]) 10/06/2016  . Left-sided weakness 10/03/2016  . Chest pain 02/27/2016  . History of stroke 02/19/2016  . Thumb pain, left 01/22/2016  . Traumatic closed nondisplaced fracture of base of metacarpal bone of left thumb 01/22/2016  . Colon cancer screening 11/27/2015  . Depression 11/14/2015  . Coronary artery disease 11/13/2015  . Hypertension 11/13/2015  . History of skin cancer 11/13/2015  . Osteoporosis 11/13/2015  . Chronic low back pain 11/13/2015  . Left medial knee pain 11/13/2015  . Osteoarthritis of multiple joints 11/13/2015  . Other specified hypothyroidism 08/16/2015  . Controlled type 2 diabetes mellitus with diabetic neuropathy ([place]) 08/16/2015  .  COPD (chronic obstructive pulmonary disease) ([place]) 08/16/2015  . Tobacco abuse 08/16/2015  . HLD (hyperlipidemia) 08/16/2015  . History of CHF (congestive heart failure) 08/16/2015  . Seizure disorder [facility])     Past Surgical History:  Procedure Laterality Date  . ABDOMINAL HYSTERECTOMY  1976  . APPENDECTOMY  1980  . BACK SURGERY    . cardiac stents    . CAROTID ENDARTERECTOMY    . CHOLECYSTECTOMY  1980  . COLONOSCOPY N/A 02/20/2017   Procedure:  COLONOSCOPY;  Surgeon: Lin Landsman, MD;  Location: [facility] ENDOSCOPY;  Service: Gastroenterology;  Laterality: N/A;  . ESOPHAGOGASTRODUODENOSCOPY N/A 02/20/2017   Procedure: ESOPHAGOGASTRODUODENOSCOPY (EGD);  Surgeon: Lin Landsman, MD;  Location: [facility] ENDOSCOPY;  Service: Gastroenterology;  Laterality: N/A;  . ESOPHAGOGASTRODUODENOSCOPY (EGD) WITH PROPOFOL N/A 07/08/2017   Procedure: ESOPHAGOGASTRODUODENOSCOPY (EGD) WITH PROPOFOL;  Surgeon: [name], MD;  Location: [facility] ENDOSCOPY;  Service: Endoscopy;  Laterality: N/A;  . [place]  . GIVENS CAPSULE STUDY N/A 07/09/2017   Procedure: GIVENS CAPSULE STUDY;  Surgeon: Jonathon Bellows, MD;  Location: [facility] ENDOSCOPY;  Service: Gastroenterology;  Laterality: N/A;  . KNEE SURGERY     left  . KYPHOPLASTY N/A 03/16/2018   Procedure: KYPHOPLASTY, L5;  Surgeon: Hessie Knows, MD;  Location: [facility] ORS;  Service: Orthopedics;  Laterality: N/A;  . REVERSE SHOULDER ARTHROPLASTY Right 08/25/2019   Procedure: REVERSE SHOULDER ARTHROPLASTY;  Surgeon: Corky Mull, MD;  Location: [facility] ORS;  Service: Orthopedics;  Laterality: Right;  . ROTATOR CUFF REPAIR     right  . TONSILLECTOMY  1950     OB History   No obstetric history on file.     Family History  Problem Relation Age of Onset  . Breast cancer Mother   . Heart disease Mother   . Stroke Mother   . Cancer Mother   . COPD Mother   . Diabetes Mother   . Heart disease Father   . Diabetes Father   . Stroke Father   . Alcohol abuse Sister   . Drug abuse Sister   . Stroke Sister   . Cancer Sister   . Mental illness Sister   . Heart disease Brother   . Arthritis Brother   . Diabetes Brother   . Heart disease Maternal Grandfather   . Heart disease Paternal Grandfather     Social History   Tobacco Use  . Smoking status: Current Every Day Smoker    Packs/day: 1.00    Years: 53.00    Pack years: 53.00    Types: Cigarettes  . Smokeless tobacco: Never Used  Vaping Use    . Vaping Use: Former  Substance Use Topics  . Alcohol use: No  . Drug use: No    Home Medications Prior to Admission medications   Medication Sig Start Date End Date Taking? Authorizing Provider  albuterol (PROVENTIL HFA;VENTOLIN HFA) 108 (90 Base) MCG/ACT inhaler Inhale 2 puffs into the lungs every 6 (six) hours as needed for wheezing or shortness of breath. 10/08/16   Karamalegos, Devonne Doughty, DO  aspirin (ASPIRIN CHILDRENS) 81 MG chewable tablet Chew 1 tablet (81 mg total) by mouth daily. 03/17/18   Bettey Costa, MD  atorvastatin (LIPITOR) 80 MG tablet Take 1 tablet (80 mg total) by mouth daily at 6 PM. 09/24/16   Karamalegos, Devonne Doughty, DO  clopidogrel (PLAVIX) 75 MG tablet Take 75 mg by mouth daily. 10/07/18   [provider]  DULoxetine (CYMBALTA) 60 MG capsule Take 1 capsule (60  mg total) by mouth at bedtime. Patient taking differently: Take 60 mg by mouth daily.  09/24/16   Karamalegos, Devonne Doughty, DO  ferrous sulfate 325 (65 FE) MG EC tablet Take 1 tablet (325 mg total) by mouth 2 (two) times daily with a meal. Patient taking differently: Take 325 mg by mouth daily.  02/21/17   Hillary Bow, MD  fluticasone (FLONASE) 50 MCG/ACT nasal spray Place 1 spray into both nostrils 2 (two) times daily as needed for allergies.  05/24/19   [provider]  furosemide (LASIX) 20 MG tablet Take 20 mg by mouth daily.  10/07/18   [provider]  gabapentin (NEURONTIN) 800 MG tablet Take 1 tablet (800 mg total) by mouth 2 (two) times daily. 09/24/16   Karamalegos, Devonne Doughty, DO  glipiZIDE-metformin (METAGLIP) 5-500 MG tablet Take 1 tablet by mouth 2 (two) times daily before a meal. Patient taking differently: Take 1-2 tablets by mouth See admin instructions. Take 2 tablets by mouth every morning before breakfast and take 1 tablet by mouth every evening before dinner 04/02/17   Parks Ranger, Devonne Doughty, DO  levETIRAcetam (KEPPRA) 500 MG tablet Take 1 tablet (500 mg total) by  mouth 2 (two) times daily. 09/19/15   Epifanio Lesches, MD  levothyroxine (SYNTHROID) 125 MCG tablet Take 125 mcg by mouth daily before breakfast. 06/16/19   [provider]  nitroGLYCERIN (NITROSTAT) 0.4 MG SL tablet Place 1 tablet (0.4 mg total) under the tongue every 5 (five) minutes as needed for chest pain. 02/28/16   Bettey Costa, MD  omeprazole (PRILOSEC) 20 MG capsule Take 20 mg by mouth daily.  09/02/17   [provider]  topiramate (TOPAMAX) 25 MG tablet Take 25 mg by mouth 2 (two) times daily. 10/07/18   [provider]  traMADol (ULTRAM) 50 MG tablet Take 1 tablet (50 mg total) by mouth every 6 (six) hours. 08/29/19   Lattie Corns, PA-C  vitamin B-12 (CYANOCOBALAMIN) 1000 MCG tablet Take 1,000 mcg by mouth daily.    [provider]    Allergies    Ivp dye [iodinated diagnostic agents], Methotrexate, Morphine and related, Mushroom extract complex, Atenolol, Percocet [oxycodone-acetaminophen], Percodan [oxycodone-aspirin], Tramadol, Verapamil, and Oxycodone hcl  Review of Systems   Review of Systems  Unable to perform ROS: Mental status change    Physical Exam Updated Vital Signs BP (!) 129/52   Pulse 87   Temp (!) 100.8 F (38.2 C) (Rectal)   Resp (!) 23   Ht 5\' 1"  (1.549 m)   Wt 76.2 kg   SpO2 97%   BMI 31.74 kg/m   Physical Exam Vitals and nursing note reviewed.  Constitutional:      Appearance: She is well-developed. She is obese.  HENT:     Head: Normocephalic and atraumatic.  Eyes:     Conjunctiva/sclera: Conjunctivae normal.  Cardiovascular:     Rate and Rhythm: Normal rate and regular rhythm.  Pulmonary:     Effort: Pulmonary effort is normal. No respiratory distress.     Breath sounds: Normal breath sounds.  Abdominal:     General: Bowel sounds are normal.     Palpations: Abdomen is soft.     Tenderness: There is no abdominal tenderness. There is no guarding.  Genitourinary:    Comments: Pt incontinent of urine,  appears pink-tinged Musculoskeletal:     Comments: Right post-operative wound appears to be healing well, multiple staples in place to anterior shoulder. No signs of infection. Generalized swelling, old bruising  about the right shoulder.   Skin:    General: Skin is warm.     Comments: Warm to the touch Abrasion to right knee  Neurological:     Mental Status: She is alert.     Comments: Pt opens eyes to verbal stimuli. She Aguirre intermittently respond to verbal stimuli, sometimes with a grunt or a "yeah." Intermittently following simple commands such as gripping with hands. PERRL.  Spontaneously moving right hand (arm in sling immobilizer) and moving left arm  Psychiatric:        Behavior: Behavior normal.     ED Results / Procedures / Treatments   Labs (all labs ordered are listed, but only abnormal results are displayed) Labs Reviewed  COMPREHENSIVE METABOLIC PANEL - Abnormal; Notable for the following components:      Result Value   Potassium 3.2 (*)    CO2 21 (*)    Glucose, Bld 172 (*)    Calcium 8.1 (*)    Total Protein 5.8 (*)    Albumin 2.6 (*)    AST 51 (*)    ALT 46 (*)    Alkaline Phosphatase 167 (*)    Total Bilirubin 1.4 (*)    All other components within normal limits  CBC WITH DIFFERENTIAL/PLATELET - Abnormal; Notable for the following components:   WBC 13.0 (*)    RBC 3.40 (*)    Hemoglobin 9.9 (*)    HCT 31.5 (*)    Neutro Abs 11.8 (*)    Lymphs Abs 0.3 (*)    All other components within normal limits  URINALYSIS, ROUTINE W REFLEX MICROSCOPIC - Abnormal; Notable for the following components:   Color, Urine AMBER (*)    Ketones, ur 5 (*)    All other components within normal limits  RETICULOCYTES - Abnormal; Notable for the following components:   RBC. 3.28 (*)    All other components within normal limits  CBG MONITORING, ED - Abnormal; Notable for the following components:   Glucose-Capillary 172 (*)    All other components within normal limits  CBG  MONITORING, ED - Abnormal; Notable for the following components:   Glucose-Capillary 161 (*)    All other components within normal limits  SARS CORONAVIRUS 2 BY RT PCR (HOSPITAL ORDER, [place])  CULTURE, BLOOD (ROUTINE X 2)  CULTURE, BLOOD (ROUTINE X 2)  URINE CULTURE  CSF CULTURE  LACTIC ACID, PLASMA  PROTIME-INR  APTT  LIPASE, BLOOD  LACTIC ACID, PLASMA  CSF CELL COUNT WITH DIFFERENTIAL  PROTEIN AND GLUCOSE, CSF  HERPES SIMPLEX VIRUS(HSV) DNA BY PCR  COMPREHENSIVE METABOLIC PANEL  TSH  AMMONIA  VITAMIN B12  IRON AND TIBC  FERRITIN  OCCULT BLOOD X 1 CARD TO LAB, STOOL  MAGNESIUM  FOLATE    EKG None  Radiology CT ABDOMEN PELVIS WO CONTRAST  Result Date: 09/06/2019 CLINICAL DATA:  Abdominal pain and fevers with elevated LFTs EXAM: CT ABDOMEN AND PELVIS WITHOUT CONTRAST TECHNIQUE: Multidetector CT imaging of the abdomen and pelvis was performed following the standard protocol without IV contrast. COMPARISON:  07/15/2017 FINDINGS: Lower chest: No acute abnormality. Hepatobiliary: No focal liver abnormality is seen. Status post cholecystectomy. No biliary dilatation. Pancreas: Unremarkable. No pancreatic ductal dilatation or surrounding inflammatory changes. Spleen: Normal in size without focal abnormality. Adrenals/Urinary Tract: Adrenal glands are stable in appearance. Mild hyperplasia in the left adrenal gland is seen. The kidneys demonstrate a tiny nonobstructing stone in the lower pole of the right kidney. No obstructive  changes are seen. The bladder is decompressed. Stomach/Bowel: Mild retained fecal material is noted within the colon. No obstructive or inflammatory changes are seen. The appendix is within normal limits. Small bowel and stomach are unremarkable. Vascular/Lymphatic: Aortic atherosclerosis. No enlarged abdominal or pelvic lymph nodes. Reproductive: Status post hysterectomy. No adnexal masses. Other: No abdominal wall hernia or  abnormality. No abdominopelvic ascites. Musculoskeletal: Changes of prior vertebral augmentation are seen at L5. Chronic L1 compression deformity is seen. IMPRESSION: Chronic changes similar to that seen on the prior exam. No acute abnormality correspond with the patient's given clinical symptomatology is noted. Electronically Signed   By: Inez Catalina M.D.   On: 09/06/2019 17:48   CT Head Wo Contrast  Result Date: 09/06/2019 CLINICAL DATA:  Mental status changes. Unwitnessed fall, on blood thinners EXAM: CT HEAD WITHOUT CONTRAST TECHNIQUE: Contiguous axial images were obtained from the base of the skull through the vertex without intravenous contrast. COMPARISON:  06/15/2019 FINDINGS: Brain: Old left parietal infarct, stable. Stable old cerebellar infarcts bilaterally. There is atrophy and chronic small vessel disease changes. No acute intracranial abnormality. Specifically, no hemorrhage, hydrocephalus, mass lesion, acute infarction, or significant intracranial injury. Vascular: No hyperdense vessel or unexpected calcification. Skull: No acute calvarial abnormality. Sinuses/Orbits: Visualized paranasal sinuses and mastoids clear. Orbital soft tissues unremarkable. Other: None IMPRESSION: Old left parietal and bilateral cerebellar infarcts. Atrophy, chronic microvascular disease. No acute intracranial abnormality. Electronically Signed   By: Rolm Baptise M.D.   On: 09/06/2019 17:46   CT Shoulder Right Wo Contrast  Result Date: 09/06/2019 CLINICAL DATA:  Septic arthritis suspected, shoulder, xray done recent shoulder replacement, sepsis unknown source EXAM: CT OF THE UPPER RIGHT EXTREMITY WITHOUT CONTRAST TECHNIQUE: Multidetector CT imaging of the upper right extremity was performed according to the standard protocol. COMPARISON:  Most recent radiograph 08/25/2019 FINDINGS: Bones/Joint/Cartilage Reverse shoulder arthroplasty. There is regional streak artifact. Allowing for artifact, no periprosthetic  lucency. Humeral stem is midline. Limited assessment for joint effusion given surrounding streak artifact. No obvious effusion. Ligaments Suboptimally assessed by CT. Muscles and Tendons No evidence of intramuscular fluid collection. Muscle bulk maintained. Soft tissues Mild subcutaneous stranding posteriorly. Anterior skin staples in place. There is no subcutaneous fluid collection. No axillary adenopathy. IMPRESSION: 1. Reverse shoulder arthroplasty. There is streak artifact from surgical hardware that limits regional evaluation. Allowing for this, no obvious joint effusion or findings to suspect septic joint. Joint aspiration could be considered if there is persistent clinical concern. 2. Mild subcutaneous stranding posteriorly may be postsurgical, no subcutaneous fluid collection. Electronically Signed   By: Keith Rake M.D.   On: 09/06/2019 22:07   DG Chest Port 1 View  Result Date: 09/06/2019 CLINICAL DATA:  Possible sepsis EXAM: PORTABLE CHEST 1 VIEW COMPARISON:  06/15/2019 FINDINGS: No new consolidation or edema. No pleural effusion. Similar cardiomediastinal contours. Interval right reverse shoulder arthroplasty. IMPRESSION: No acute process in the chest. Electronically Signed   By: Macy Mis M.D.   On: 09/06/2019 16:37   DG Knee Complete 4 Views Right  Result Date: 09/06/2019 CLINICAL DATA:  Fall EXAM: RIGHT KNEE - COMPLETE 4+ VIEW COMPARISON:  November 13, 2015 FINDINGS: No evidence of acute fracture. No malalignment. Likely small joint effusion, although obliquity of the lateral radiograph limits evaluation. Diffuse osteopenia. Joint spaces are relatively well maintained. Osteophytic spurring arising from bilateral femoral condyles. Superior patellar enthesophyte. Mild soft tissue swelling about the knee. Vascular calcifications. IMPRESSION: No evidence of acute fracture or malalignment. Small joint effusion. Electronically Signed  By: Margaretha Sheffield MD   On: 09/06/2019 16:50   US  Abdomen Limited RUQ  Result Date: 09/06/2019 CLINICAL DATA:  Elevated liver function tests EXAM: ULTRASOUND ABDOMEN LIMITED RIGHT UPPER QUADRANT COMPARISON:  None. FINDINGS: Gallbladder: The gallbladder is absent Common bile duct: Diameter: 10 mm in proximal diameter. The distal duct is obscured by overlying bowel gas. Liver: Hepatic parenchymal echogenicity is diffusely, mildly increased suggesting changes of mild hepatic steatosis. No focal intrahepatic masses are seen. There is no intrahepatic biliary ductal dilation. Portal vein is patent on color Doppler imaging with normal direction of blood flow towards the liver. Other: No ascites IMPRESSION: Status post cholecystectomy. Mild dilation of the extrahepatic bile duct is nonspecific and may represent post cholecystectomy change. Mild hepatic steatosis Electronically Signed   By: Fidela Salisbury MD   On: 09/06/2019 20:56    Procedures .Critical Care Performed by: Lowana Hable, [place] N, PA-C Authorized by: Alayziah Tangeman, [place] N, PA-C   Critical care provider statement:    Critical care time (minutes):  45   Critical care time was exclusive of:  Separately billable procedures and treating other patients and teaching time   Critical care was necessary to treat or prevent imminent or life-threatening deterioration of the following conditions:  Sepsis   Critical care was time spent personally by me on the following activities:  Discussions with consultants, evaluation of patient's response to treatment, examination of patient, ordering and performing treatments and interventions, ordering and review of laboratory studies, ordering and review of radiographic studies, pulse oximetry, re-evaluation of patient's condition, obtaining history from patient or surrogate and review of old charts   I assumed direction of critical care for this patient from another provider in my specialty: no     (including critical care time)  Medications Ordered in ED Medications   vancomycin (VANCOREADY) IVPB 750 mg/150 mL (has no administration in time range)  cefTRIAXone (ROCEPHIN) 1 g in sodium chloride 0.9 % 100 mL IVPB (has no administration in time range)  cefTRIAXone (ROCEPHIN) 2 g in sodium chloride 0.9 % 100 mL IVPB (has no administration in time range)  lactated ringers infusion ( Intravenous New Bag/Given 09/06/19 2035)  ampicillin (OMNIPEN) 2 g in sodium chloride 0.9 % 100 mL IVPB (2 g Intravenous New Bag/Given 09/06/19 2256)  insulin aspart (novoLOG) injection 0-9 Units (2 Units Subcutaneous Given 09/06/19 2253)  acetaminophen (TYLENOL) tablet 650 mg (has no administration in time range)    Or  acetaminophen (TYLENOL) suppository 650 mg (has no administration in time range)  lactated ringers bolus 1,000 mL (0 mLs Intravenous Stopped 09/06/19 1930)  acetaminophen (TYLENOL) suppository 650 mg (650 mg Rectal Given 09/06/19 1926)  potassium chloride 10 mEq in 100 mL IVPB (0 mEq Intravenous Stopped 09/06/19 2143)  vancomycin (VANCOREADY) IVPB 1750 mg/350 mL (0 mg Intravenous Stopped 09/06/19 2238)    ED Course  I have reviewed the triage vital signs and the nursing notes.  Pertinent labs & imaging results that were available during my care of the patient were reviewed by me and considered in my medical decision making (see chart for details).  Clinical Course as of Sep 05 2300  Tue Sep 06, 2019  1851 Attempted to contact patient's daughter, voicemail left.   [JR]  1910 Discussed with patient's daughter, Davine Sweney.  She states she last spoke with her mother a week ago on Friday.  Her mother stated her shoulder was doing well, however she told her that they were not giving her therapy  on her shoulder they were doing it on her hip and the leg.  She reported that her wound looked well.  However, her daughter noted that she was repeating herself.  She states she has not spoken with her since.  She was unaware of her fall or being sent to the ED until she had a call from  ED registration.   [JR]  1930 Dr. Marlowe Sax accepting admission. Requests LP. Patient family just returned call with approval to perform LP   [JR]  2039 Plavix contraindication for LP. Aguirre continue broad spectrum antibiotics. Discussed with hospitalist who is in agreement.    [JR]    Clinical Course User Index [JR] Kenzi Bardwell, [place] N, PA-C   MDM Rules/Calculators/A&P                         Audreanna Torrisi was evaluated in Emergency Department on 09/06/2019 for the symptoms described in the history of present illness. She was evaluated in the context of the global COVID-19 pandemic, which necessitated consideration that the patient might be at risk for infection with the SARS-CoV-2 virus that causes COVID-19. Institutional protocols and algorithms that pertain to the evaluation of patients at risk for COVID-19 are in a state of rapid change based on information released by regulatory bodies including the CDC and federal and state organizations. These policies and algorithms were followed during the patient's care in the ED.   Patient brought in from nursing facility for altered mental status.  Per EMS, facility reported last known normal was Friday, with unwitnessed fall on Saturday where she had contusion to her right knee.  EMS administered 1 L of normal saline in route.  On arrival today, she is febrile with rectal temp of 102.7 F.  She is alert, and Aguirre respond to verbal stimuli, however is not answering questions.  She is intermittently following commands such as assessing grip.  Blood pressure is stable, O2 saturation is stable.  She is somewhat tachypneic.  She is also noted to be incontinent of urine.  Surgical site however appears to be healing well, no signs of infection, no redness or drainage.  Sepsis protocol initiated, initial suspected source appears to be urinary.  Aguirre cover with Rocephin, IV fluids ordered.  Labs with leukocytosis of 13.  Hemoglobin has dropped 2 g since postop  day 3 at 11.9.  Metabolic panel with slightly low potassium, replaced IV.  Liver enzymes are also slightly elevated.  UA is negative.  CT scan of the abdomen pelvis is ordered for further evaluation of elevated LFTs, continue to search for source.  CT head pending.  Chest x-ray is negative for pneumonia.  Covid test is negative.  CT head and abdomen are negative.  Unclear source, vancomycin added on for broad-spectrum coverage.  Discussed with patient's daughter, Jannetta Massey.  She requests that her mother remain DNR, per the patient's wishes.  She did give permission for lumbar puncture, to evaluate for possible meningitis.  However, patient is currently taking Plavix and this is contraindicated at this time therefore Aguirre defer.    Patient is admitted to the hospitalist service, Dr. Marlowe Sax accepting admission for further management. Final Clinical Impression(s) / ED Diagnoses Final diagnoses:  Fall at nursing home, initial encounter  Sepsis with encephalopathy without septic shock, due to unspecified organism [facility])  Hypokalemia    Rx / DC Orders ED Discharge Orders    None       Brihanna Devenport, [place] N,  PA-C 09/06/19 2301    Blayze Haen, [place] N, PA-C 09/06/19 2302    Lucrezia Starch, MD 09/08/19 [phone]

## 2019-09-07 ENCOUNTER — Inpatient Hospital Stay (HOSPITAL_COMMUNITY): Payer: Medicare Other

## 2019-09-07 DIAGNOSIS — R7989 Other specified abnormal findings of blood chemistry: Secondary | ICD-10-CM

## 2019-09-07 DIAGNOSIS — R7881 Bacteremia: Secondary | ICD-10-CM

## 2019-09-07 DIAGNOSIS — R509 Fever, unspecified: Secondary | ICD-10-CM

## 2019-09-07 DIAGNOSIS — D649 Anemia, unspecified: Secondary | ICD-10-CM

## 2019-09-07 DIAGNOSIS — G9340 Encephalopathy, unspecified: Secondary | ICD-10-CM

## 2019-09-07 DIAGNOSIS — R6520 Severe sepsis without septic shock: Secondary | ICD-10-CM

## 2019-09-07 DIAGNOSIS — A419 Sepsis, unspecified organism: Secondary | ICD-10-CM

## 2019-09-07 LAB — COMPREHENSIVE METABOLIC PANEL
ALT: 44 U/L (ref 0–44)
AST: 49 U/L — ABNORMAL HIGH (ref 15–41)
Albumin: 2.3 g/dL — ABNORMAL LOW (ref 3.5–5.0)
Alkaline Phosphatase: 148 U/L — ABNORMAL HIGH (ref 38–126)
Anion gap: 12 (ref 5–15)
BUN: 14 mg/dL (ref 8–23)
CO2: 19 mmol/L — ABNORMAL LOW (ref 22–32)
Calcium: 8.2 mg/dL — ABNORMAL LOW (ref 8.9–10.3)
Chloride: 107 mmol/L (ref 98–111)
Creatinine, Ser: 0.6 mg/dL (ref 0.44–1.00)
GFR calc Af Amer: >60 mL/min (ref >60)
GFR calc non Af Amer: >60 mL/min (ref >60)
Glucose, Bld: 134 mg/dL — ABNORMAL HIGH (ref 70–99)
Potassium: 3.4 mmol/L — ABNORMAL LOW (ref 3.5–5.1)
Sodium: 138 mmol/L (ref 135–145)
Total Bilirubin: 0.7 mg/dL (ref 0.3–1.2)
Total Protein: 5.2 g/dL — ABNORMAL LOW (ref 6.5–8.1)

## 2019-09-07 LAB — BLOOD CULTURE ID PANEL (REFLEXED) - BCID2

## 2019-09-07 LAB — AMMONIA: Ammonia: 40 umol/L — ABNORMAL HIGH (ref 9–35)

## 2019-09-07 LAB — URINE CULTURE: Culture: NO GROWTH

## 2019-09-07 LAB — ECHOCARDIOGRAM COMPLETE
AR max vel: 2.82 cm2
AV Area VTI: 2.47 cm2
AV Area mean vel: 2.48 cm2
AV Mean grad: 4.5 mmHg
AV Peak grad: 8.6 mmHg
Ao pk vel: 1.47 m/s
Height: 61 in
S' Lateral: 3.7 cm
Weight: 2687.85 oz

## 2019-09-07 LAB — IRON AND TIBC
Iron: 5 ug/dL — ABNORMAL LOW (ref 28–170)
Saturation Ratios: 3 % — ABNORMAL LOW (ref 10.4–31.8)
TIBC: 181 ug/dL — ABNORMAL LOW (ref 250–450)
UIBC: 176 ug/dL

## 2019-09-07 LAB — GLUCOSE, CAPILLARY
Glucose-Capillary: 115 mg/dL — ABNORMAL HIGH (ref 70–99)
Glucose-Capillary: 129 mg/dL — ABNORMAL HIGH (ref 70–99)
Glucose-Capillary: 121 mg/dL — ABNORMAL HIGH (ref 70–99)
Glucose-Capillary: 119 mg/dL — ABNORMAL HIGH (ref 70–99)

## 2019-09-07 LAB — VITAMIN B12: Vitamin B-12: 729 pg/mL (ref 180–914)

## 2019-09-07 LAB — FERRITIN: Ferritin: 205 ng/mL (ref 11–307)

## 2019-09-07 LAB — FOLATE: Folate: 5 ng/mL — ABNORMAL LOW (ref >5.9)

## 2019-09-07 MED ORDER — LEVETIRACETAM 500 MG PO TABS
500.0000 mg | ORAL_TABLET | Freq: Two times a day (BID) | ORAL | Status: DC
  Administered 2019-09-07 – 2019-09-12 (×11): 500 mg via ORAL
  Filled 2019-09-07 (×11): qty 1

## 2019-09-07 MED ORDER — PERFLUTREN LIPID MICROSPHERE
1.0000 mL | INTRAVENOUS | Status: AC | PRN
  Administered 2019-09-07: 4 mL via INTRAVENOUS
  Filled 2019-09-07: qty 10

## 2019-09-07 MED ORDER — LEVOTHYROXINE SODIUM 25 MCG PO TABS
125.0000 ug | ORAL_TABLET | Freq: Every day | ORAL | Status: DC
  Administered 2019-09-08 – 2019-09-12 (×5): 125 ug via ORAL
  Filled 2019-09-07 (×5): qty 1

## 2019-09-07 MED ORDER — FOLIC ACID 1 MG PO TABS
1.0000 mg | ORAL_TABLET | Freq: Every day | ORAL | Status: DC
  Administered 2019-09-07 – 2019-09-12 (×6): 1 mg via ORAL
  Filled 2019-09-07 (×6): qty 1

## 2019-09-07 MED ORDER — ATORVASTATIN CALCIUM 80 MG PO TABS
80.0000 mg | ORAL_TABLET | Freq: Every day | ORAL | Status: DC
  Administered 2019-09-07 – 2019-09-12 (×5): 80 mg via ORAL
  Filled 2019-09-07 (×6): qty 1

## 2019-09-07 MED ORDER — SODIUM CHLORIDE 0.9 % IV SOLN
2.0000 g | Freq: Three times a day (TID) | INTRAVENOUS | Status: DC
  Administered 2019-09-07 – 2019-09-12 (×15): 2 g via INTRAVENOUS
  Filled 2019-09-07 (×16): qty 2

## 2019-09-07 MED ORDER — FERROUS SULFATE 325 (65 FE) MG PO TABS
325.0000 mg | ORAL_TABLET | Freq: Two times a day (BID) | ORAL | Status: DC
  Administered 2019-09-07 – 2019-09-12 (×11): 325 mg via ORAL
  Filled 2019-09-07 (×11): qty 1

## 2019-09-07 MED ORDER — TOPIRAMATE 25 MG PO TABS
25.0000 mg | ORAL_TABLET | Freq: Two times a day (BID) | ORAL | Status: DC
  Administered 2019-09-07 – 2019-09-12 (×11): 25 mg via ORAL
  Filled 2019-09-07 (×11): qty 1

## 2019-09-07 MED ORDER — GABAPENTIN 400 MG PO CAPS
800.0000 mg | ORAL_CAPSULE | Freq: Two times a day (BID) | ORAL | Status: DC
  Administered 2019-09-07 – 2019-09-09 (×5): 800 mg via ORAL
  Filled 2019-09-07 (×5): qty 2

## 2019-09-07 MED ORDER — DULOXETINE HCL 60 MG PO CPEP
60.0000 mg | ORAL_CAPSULE | Freq: Every day | ORAL | Status: DC
  Administered 2019-09-07 – 2019-09-12 (×6): 60 mg via ORAL
  Filled 2019-09-07 (×6): qty 1

## 2019-09-07 NOTE — Evaluation (Addendum)
Physical Therapy Evaluation Patient Details Name: Samantha Aguirre MRN: [id number] DOB: Jan 31, 1945 Today's Date: 09/07/2019   History of Present Illness  Samantha Aguirre is a 74 y.o. female with medical history significant of paroxysmal A. fib, chronic diastolic CHF, COPD, asthma, CAD, noninsulin-dependent type II diabetes, hypertension, hyperlipidemia, seizure disorder, CVA, hypothyroidism, GERD, recent reverse right total shoulder arthroplasty on 08/25/2019 presenting to the ED via EMS from [facility] for evaluation of fever and altered mental status.  Facility reported to EMS that patient was last known well on Friday.  It is reported that she had an unwitnessed fall on Saturday.  She had an abrasion noted to her knee, unknown head injury.  Clinical Impression  Patient received in bed, responds to name being called when I entered room, but otherwise does not answer questions. She requires max +2 assist at this time for repositioning in bed. Patient leaning to her right upon arrival to room. Unable to follow commands to move LEs in bed. She will continue to benefit from skilled PT while here to improve strength and functional mobility. We will need to assess further mobility next session as patient able.        Follow Up Recommendations SNF    Equipment Recommendations  None recommended by PT    Recommendations for Other Services       Precautions / Restrictions Precautions Precautions: Fall Type of Shoulder Precautions: rev total shoulder recently Shoulder Interventions: Shoulder sling/immobilizer;Shoulder abduction pillow;Off for dressing/bathing/exercises Required Braces or Orthoses: Sling Restrictions Weight Bearing Restrictions: Yes RUE Weight Bearing: Non weight bearing      Mobility  Bed Mobility Overal bed mobility: Needs Assistance Bed Mobility: Supine to Sit;Sit to Supine;Rolling Rolling: Max assist;+2 for physical assistance   Supine to sit: +2 for physical  assistance;Max assist Sit to supine: Max assist;+2 for physical assistance      Transfers                 General transfer comment: unable  Ambulation/Gait             General Gait Details: unable  Stairs            Wheelchair Mobility    Modified Rankin (Stroke Patients Only)       Balance Overall balance assessment: Needs assistance   Sitting balance-Leahy Scale: Zero                                       Pertinent Vitals/Pain Pain Assessment: Faces Faces Pain Scale: Hurts even more Pain Location: right shoulder Pain Intervention(s): Monitored during session;Repositioned    Home Living Family/patient expects to be discharged to:: Skilled nursing facility Living Arrangements: Children Available Help at Discharge: Family;Available PRN/intermittently Type of Home: Mobile home Home Access: Stairs to enter Entrance Stairs-Rails: Can reach both;Left;Right Entrance Stairs-Number of Steps: 3 Home Layout: One level Home Equipment: Walker - 2 wheels;Wheelchair - power;Cane - quad      Prior Function Level of Independence: Independent with assistive device(s)         Comments: Pt reports she is not out of the home much, but uses a RW much of the time     Hand Dominance   Dominant Hand: Right    Extremity/Trunk Assessment   Upper Extremity Assessment Upper Extremity Assessment: Defer to OT evaluation    Lower Extremity Assessment Lower Extremity Assessment: Generalized weakness  Communication   Communication: Other (comment) (not conversive this session. Answers to name, but that is all)  Cognition Arousal/Alertness: Lethargic   Overall Cognitive Status: No family/caregiver present to determine baseline cognitive functioning                                 General Comments: Unable to follow commands at this time      General Comments      Exercises     Assessment/Plan    PT Assessment  Patient needs continued PT services  PT Problem List Decreased strength;Decreased mobility;Decreased activity tolerance;Decreased balance;Pain       PT Treatment Interventions Gait training;DME instruction;Stair training;Functional mobility training;Therapeutic activities;Therapeutic exercise;Balance training;Neuromuscular re-education;Patient/family education    PT Goals (Current goals can be found in the Care Plan section)  Acute Rehab PT Goals Patient Stated Goal: none stated Time For Goal Achievement: 09/13/19    Frequency Min 3X/week   Barriers to discharge Decreased caregiver support;Inaccessible home environment      Co-evaluation               AM-PAC PT "6 Clicks" Mobility  Outcome Measure Help needed turning from your back to your side while in a flat bed without using bedrails?: Total Help needed moving from lying on your back to sitting on the side of a flat bed without using bedrails?: Total Help needed moving to and from a bed to a chair (including a wheelchair)?: Total Help needed standing up from a chair using your arms (e.g., wheelchair or bedside chair)?: Total Help needed to walk in hospital room?: Total Help needed climbing 3-5 steps with a railing? : Total 6 Click Score: 6    End of Session   Activity Tolerance: Patient limited by lethargy Patient left: in bed;with call bell/phone within reach;with bed alarm set Nurse Communication: Mobility status PT Visit Diagnosis: Muscle weakness (generalized) (M62.81);Other abnormalities of gait and mobility (R26.89);History of falling (Z91.81)    Time: 5456-2563 PT Time Calculation (min) (ACUTE ONLY): 11 min   Charges:   PT Evaluation $PT Eval Moderate Complexity: 1 Mod          Maela Takeda, PT, GCS 09/07/19,3:53 PM

## 2019-09-07 NOTE — Progress Notes (Signed)
Pharmacy Antibiotic Note  Samantha Aguirre is a 74 y.o. female admitted on 09/06/2019 with bacteremia.  Pharmacy has been consulted for Cefepime dosing.  ID: sepsis with Klebsiella in [facility] Tmax 102.7. WBC 13, CrCl 56 -d/c LP order 8/25 (pt on Plavix)  8/24 vancomycin >> 8/24 ceftriaxone >>8/24 8/24 Ampicillin>8/25 Cefepime 8/25>>   Plan: Change abx to Cefepime 2g IV q8hr  F/u to resume home meds    Height: 5\' 1"  (154.9 cm) Weight: 76.2 kg (167 lb 15.9 oz) IBW/kg (Calculated) : 47.8  Temp (24hrs), Avg:99.7 F (37.6 C), Min:98 F (36.7 C), Max:102.7 F (39.3 C)  Recent Labs  Lab 09/06/19 1617 09/07/19 0557  WBC 13.0*  --   CREATININE 0.76 0.60  LATICACIDVEN 1.5  --     Estimated Creatinine Clearance: 56.8 mL/min (by C-G formula based on SCr of 0.6 mg/dL).    Allergies  Allergen Reactions  . Ivp Dye [Iodinated Diagnostic Agents] Itching    Pt injected with IV contrast.  5 min after injection pt complained of itching behind ear and on her abd.  . Methotrexate Rash  . Morphine And Related Other (See Comments)    "stops my breathing" NEAR-RESPIRATORY ARREST  . Mushroom Extract Complex Anaphylaxis    Made patient "deathly sick"  . Atenolol Other (See Comments)    "MD took me off of it bc it was doing something wrong" Made patient HYPOTENSIVE  . Percocet [Oxycodone-Acetaminophen] Nausea And Vomiting and Other (See Comments)    Stomach pains  . Percodan [Oxycodone-Aspirin] Nausea And Vomiting and Other (See Comments)    Stomach pains  . Tramadol Nausea Only  . Verapamil Other (See Comments)    " MD told me this was screwing me up" MAKES PATIENT HYPOTENSIVE  . Oxycodone Hcl    Bari Leib S. Alford Highland, PharmD, BCPS Clinical Staff Pharmacist [email or web address]  Wayland Salinas 09/07/2019 11:05 AM

## 2019-09-07 NOTE — Evaluation (Signed)
Clinical/Bedside Swallow Evaluation Patient Details  Name: Samantha Aguirre MRN: [id number] Date of Birth: 03-10-1945  Today's Date: 09/07/2019 Time: SLP Start Time (ACUTE ONLY): 4742 SLP Stop Time (ACUTE ONLY): 1210 SLP Time Calculation (min) (ACUTE ONLY): 14 min  Past Medical History:  Past Medical History:  Diagnosis Date  . Acid reflux   . Arthritis    Bilateral knees, hands, feet  . Asthma   . Atrial fibrillation ([place])   . Cancer ([place])   . CHF (congestive heart failure) ([facility])    Diastolic CHF  . COPD (chronic obstructive pulmonary disease) ([place])   . Coronary artery disease   . Diabetes mellitus without complication ([place])   . Frequent headaches   . GI bleed   . HLD (hyperlipidemia)   . Hypertension   . Seizures ([place])   . Skin cancer 2015   Suspected basal cell carcinoma on nose, surgically removed  . Stroke ([place]) 04/2017  . Thyroid disease   . Tremors of nervous system    Past Surgical History:  Past Surgical History:  Procedure Laterality Date  . ABDOMINAL HYSTERECTOMY  1976  . APPENDECTOMY  1980  . BACK SURGERY    . cardiac stents    . CAROTID ENDARTERECTOMY    . CHOLECYSTECTOMY  1980  . COLONOSCOPY N/A 02/20/2017   Procedure: COLONOSCOPY;  Surgeon: Lin Landsman, MD;  Location: [facility] ENDOSCOPY;  Service: Gastroenterology;  Laterality: N/A;  . ESOPHAGOGASTRODUODENOSCOPY N/A 02/20/2017   Procedure: ESOPHAGOGASTRODUODENOSCOPY (EGD);  Surgeon: Lin Landsman, MD;  Location[facility] ENDOSCOPY;  Service: Gastroenterology;  Laterality: N/A;  . ESOPHAGOGASTRODUODENOSCOPY (EGD) WITH PROPOFOL N/A 07/08/2017   Procedure: ESOPHAGOGASTRODUODENOSCOPY (EGD) WITH PROPOFOL;  Surgeon: [name], MD;  Location: [facility] ENDOSCOPY;  Service: Endoscopy;  Laterality: N/A;  . [place]  . GIVENS CAPSULE STUDY N/A 07/09/2017   Procedure: GIVENS CAPSULE STUDY;  Surgeon: Jonathon Bellows, MD;  Location: [facility] ENDOSCOPY;  Service: Gastroenterology;  Laterality: N/A;  . KNEE  SURGERY     left  . KYPHOPLASTY N/A 03/16/2018   Procedure: KYPHOPLASTY, L5;  Surgeon: Hessie Knows, MD;  Location: [facility] ORS;  Service: Orthopedics;  Laterality: N/A;  . REVERSE SHOULDER ARTHROPLASTY Right 08/25/2019   Procedure: REVERSE SHOULDER ARTHROPLASTY;  Surgeon: Corky Mull, MD;  Location: [facility] ORS;  Service: Orthopedics;  Laterality: Right;  . ROTATOR CUFF REPAIR     right  . TONSILLECTOMY  1950   HPI:  Pt is a 74 yo female with recent fall, presenting from SNF with fever and AMS. She is admitted with sepsis of unclear source (CXR, CT abdomen, urine analysis negative). PMH includes: afib, CHF, COPD, asthma, DMII, HTN, HLD, seizure disorder, CVA, hypothyroidism, CAD, GERD, recent R shoulder arthroplasty on 08/25/19. Pt has had several previous swallow evaluations with functional appearing swallow with recommendations for mechanical soft to regular solids based on pt preference.    Assessment / Plan / Recommendation Clinical Impression  Pt's mentation is altered, with reduced awareness, intermittent command following, and slow processing. She needs assist to get boluses into her mouth, with awareness improving with hand-over-hand assist to engage in self-feeding and as trials continued to progress. Once POs are in her mouth, she has more improved automaticity but with sluggish movements. She does not have any overt signs of aspiration though. Recommend starting Dys 1 (puree) diet and thin liquids with full supervision. Will f/u for potential to advance as mentation clears.   SLP Visit Diagnosis: Dysphagia, unspecified (R13.10)    Aspiration Risk  Mild aspiration risk;Moderate aspiration risk    Diet Recommendation Dysphagia 1 (Puree);Thin liquid   Liquid Administration via: Cup;Straw Medication Administration: Crushed with puree Supervision: Staff to assist with self feeding;Full supervision/cueing for compensatory strategies Compensations: Minimize environmental distractions;Slow  rate Postural Changes: Seated upright at 90 degrees;Remain upright for at least 30 minutes after po intake    Other  Recommendations Oral Care Recommendations: Oral care QID   Follow up Recommendations Skilled Nursing facility      Frequency and Duration min 2x/week  2 weeks       Prognosis Prognosis for Safe Diet Advancement: Good Barriers to Reach Goals: Cognitive deficits      Swallow Study   General HPI: Pt is a 74 yo female with recent fall, presenting from SNF with fever and AMS. She is admitted with sepsis of unclear source (CXR, CT abdomen, urine analysis negative). PMH includes: afib, CHF, COPD, asthma, DMII, HTN, HLD, seizure disorder, CVA, hypothyroidism, CAD, GERD, recent R shoulder arthroplasty on 08/25/19. Pt has had several previous swallow evaluations with functional appearing swallow with recommendations for mechanical soft to regular solids based on pt preference.  Type of Study: Bedside Swallow Evaluation Previous Swallow Assessment: see HPI Diet Prior to this Study: NPO Temperature Spikes Noted: Yes (102.7) Respiratory Status: Room air History of Recent Intubation: No Behavior/Cognition: Alert;Cooperative;Requires cueing Oral Cavity Assessment: Within Functional Limits Oral Care Completed by SLP: No Oral Cavity - Dentition: Edentulous Vision: Functional for self-feeding Self-Feeding Abilities: Needs assist Patient Positioning: Upright in bed Baseline Vocal Quality: Normal Volitional Cough: Strong Volitional Swallow: Unable to elicit    Oral/Motor/Sensory Function Overall Oral Motor/Sensory Function:  (limited command following but appears symmetrical )   Ice Chips Ice chips: Not tested   Thin Liquid Thin Liquid: Impaired Presentation: Self Fed;Straw Oral Phase Impairments: Poor awareness of bolus    Nectar Thick Nectar Thick Liquid: Not tested   Honey Thick Honey Thick Liquid: Not tested   Puree Puree: Impaired Presentation: Spoon Oral Phase  Impairments: Poor awareness of bolus   Solid     Solid: Not tested      Osie Bond., M.A. [place] [facility] Pager [phone] Office [phone]  09/07/2019,12:24 PM

## 2019-09-07 NOTE — Consult Note (Addendum)
[facility]                                                                                        Patient Identification: Patient Name: Samantha Aguirre MRN: [id number] [place] Date: 09/06/2019  3:14 PM Age: 74 y.o.  Today's Date: 09/07/2019  Principal Problem:   AMS (altered mental status) Active Problems:   Acute on chronic anemia   Fever   SIRS (systemic inflammatory response syndrome) (HCC)   Hypokalemia   Antibiotics: Ampicillin 8/24, Vancomycin 8/24-8/25 and cefepime 8/25  Assessment 74 year old female with multiple comorbidities with recent reverse shoulder arthroplasty on 8/12 with Ortho who presented with fever and AMS. ID following for  1. Klebsiella aerogenes bacteremia, Unclear source   2.  Acute Encephalopathy   - LP was not done as patient was reportedly to be on plavix. Fever curve is trending down. On evaluation today, she responded to me when I greeted her. Per RN, she sometimes follows commands and sometimes not. She was having pureed diet with assistance from her RN which was considered an improvement by her RN.  - She did have pain/tenderness on her Rt elbow and has restricted mobility. Her staples looks well approximated with some erythema.   Recommendations  -[place] of her RT shoulder - I am concerned about infection of her RT shoulder  joint given her tenderness/recent surgery and no definite source of bacteremia. The CT of the RT shoulder was limited and was done WO contrast.  -Continue cefepime as is ( pharmacy to dose) -Monitor CBC and CMP while on IV  -F/u MRI brain   Rosiland Oz, MD [facility]  __________________________________________________________________________________________________________ HPI and Hospital Course: 74 year old female with multiple comorbidities including PAF, COPD, CHF, CVA, T2DM,  seizure d/o, thyroid disease, s/p recent total reverse right shoulder arthroplasty on 8/12 2/2  long history of progressively worsening right shoulder pain and weakness unrelieved by conservative management presented to the ED on 8/24 with complaints of fever and AMS. Patient is unable to provide history and hence, history is mostly obtained from EMR. Patient was last known well on Friday.  Apparently, she had an unwitnessed fall on Saturday.  She had an abrasion noted to her knee, unknown head injury.  Facility noted oral temperature of 101 F . COVID is negative.  EMS administered 1 L normal saline prior to arrival.    On arrival , patient was noted to be febrile with T max of 102.7. Work up is remarkable for WBC 13 ( N 90%), AST 51, ALT 46, ALP167. Ammonia 40. Patient was initially treated with Vanc, ceftriaxone and ampicillin for concern of meningitis. LP was not done as patient was on plavix at that time.  ID consulted for Klebsiella aerogenes bacteremia.   ROS - limited given her mental status   Past Medical History:  Diagnosis Date  . Acid reflux   . Arthritis    Bilateral knees, hands, feet  . Asthma   . Atrial fibrillation ([place])   . Cancer ([place])   . CHF (congestive heart failure) (HCC)    Diastolic CHF  .  COPD (chronic obstructive pulmonary disease) ([place])   . Coronary artery disease   . Diabetes mellitus without complication ([place])   . Frequent headaches   . GI bleed   . HLD (hyperlipidemia)   . Hypertension   . Seizures ([place])   . Skin cancer 2015   Suspected basal cell carcinoma on nose, surgically removed  . Stroke ([place]) 04/2017  . Thyroid disease   . Tremors of nervous system    Past Surgical History:  Procedure Laterality Date  . ABDOMINAL HYSTERECTOMY  1976  . APPENDECTOMY  1980  . BACK SURGERY    . cardiac stents    . CAROTID ENDARTERECTOMY    . CHOLECYSTECTOMY  1980  . COLONOSCOPY N/A 02/20/2017   Procedure: COLONOSCOPY;  Surgeon: Lin Landsman, MD;   Location: [facility] ENDOSCOPY;  Service: Gastroenterology;  Laterality: N/A;  . ESOPHAGOGASTRODUODENOSCOPY N/A 02/20/2017   Procedure: ESOPHAGOGASTRODUODENOSCOPY (EGD);  Surgeon: Lin Landsman, MD;  Location[facility] ENDOSCOPY;  Service: Gastroenterology;  Laterality: N/A;  . ESOPHAGOGASTRODUODENOSCOPY (EGD) WITH PROPOFOL N/A 07/08/2017   Procedure: ESOPHAGOGASTRODUODENOSCOPY (EGD) WITH PROPOFOL;  Surgeon: [name], MD;  Location: [facility] ENDOSCOPY;  Service: Endoscopy;  Laterality: N/A;  . [place]  . GIVENS CAPSULE STUDY N/A 07/09/2017   Procedure: GIVENS CAPSULE STUDY;  Surgeon: Jonathon Bellows, MD;  Location: [facility] ENDOSCOPY;  Service: Gastroenterology;  Laterality: N/A;  . KNEE SURGERY     left  . KYPHOPLASTY N/A 03/16/2018   Procedure: KYPHOPLASTY, L5;  Surgeon: Hessie Knows, MD;  Location: [facility] ORS;  Service: Orthopedics;  Laterality: N/A;  . REVERSE SHOULDER ARTHROPLASTY Right 08/25/2019   Procedure: REVERSE SHOULDER ARTHROPLASTY;  Surgeon: Corky Mull, MD;  Location: [facility] ORS;  Service: Orthopedics;  Laterality: Right;  . ROTATOR CUFF REPAIR     right  . TONSILLECTOMY  1950     Scheduled Meds: . atorvastatin  80 mg Oral q1800  . DULoxetine  60 mg Oral Daily  . ferrous sulfate  325 mg Oral BID WC  . folic acid  1 mg Oral Daily  . gabapentin  800 mg Oral BID  . insulin aspart  0-9 Units Subcutaneous Q4H  . levETIRAcetam  500 mg Oral BID  . [START ON 09/08/2019] levothyroxine  125 mcg Oral Q0600  . topiramate  25 mg Oral BID   Continuous Infusions: . ceFEPime (MAXIPIME) IV 2 g (09/07/19 1346)   PRN Meds:.acetaminophen **OR** acetaminophen  Allergies  Allergen Reactions  . Ivp Dye [Iodinated Diagnostic Agents] Itching    Pt injected with IV contrast.  5 min after injection pt complained of itching behind ear and on her abd.  . Methotrexate Rash  . Morphine And Related Other (See Comments)    "stops my breathing" NEAR-RESPIRATORY ARREST  . Mushroom Extract  Complex Anaphylaxis    Made patient "deathly sick"  . Atenolol Other (See Comments)    "MD took me off of it bc it was doing something wrong" Made patient HYPOTENSIVE  . Percocet [Oxycodone-Acetaminophen] Nausea And Vomiting and Other (See Comments)    Stomach pains  . Percodan [Oxycodone-Aspirin] Nausea And Vomiting and Other (See Comments)    Stomach pains  . Tramadol Nausea Only  . Verapamil Other (See Comments)    " MD told me this was screwing me up" MAKES PATIENT HYPOTENSIVE  . Oxycodone Hcl    Social History   Socioeconomic History  . Marital status: Divorced    Spouse name: Not on file  . Number of children: 2  .  Years of education: 1  . Highest education level: 12th grade  Occupational History  . Occupation: Disabled  Tobacco Use  . Smoking status: Current Every Day Smoker    Packs/day: 1.00    Years: 53.00    Pack years: 53.00    Types: Cigarettes  . Smokeless tobacco: Never Used  Vaping Use  . Vaping Use: Former  Substance and Sexual Activity  . Alcohol use: No  . Drug use: No  . Sexual activity: Not Currently  Other Topics Concern  . Not on file  Social History Narrative   None   Social Determinants of Health   Financial Resource Strain:   . Difficulty of Paying Living Expenses: Not on file  Food Insecurity:   . Worried About Charity fundraiser in the Last Year: Not on file  . Ran Out of Food in the Last Year: Not on file  Transportation Needs:   . Lack of Transportation (Medical): Not on file  . Lack of Transportation (Non-Medical): Not on file  Physical Activity:   . Days of Exercise per Week: Not on file  . Minutes of Exercise per Session: Not on file  Stress:   . Feeling of Stress : Not on file  Social Connections:   . Frequency of Communication with Friends and Family: Not on file  . Frequency of Social Gatherings with Friends and Family: Not on file  . Attends Religious Services: Not on file  . Active Member of Clubs or Organizations:  Not on file  . Attends Archivist Meetings: Not on file  . Marital Status: Not on file  Intimate Partner Violence:   . Fear of Current or Ex-Partner: Not on file  . Emotionally Abused: Not on file  . Physically Abused: Not on file  . Sexually Abused: Not on file    Vitals BP 118/61 (BP Location: Left Arm)   Pulse 69   Temp 97.6 F (36.4 C) (Axillary)   Resp 19   Ht 5\' 1"  (1.549 m)   Wt 76.2 kg   SpO2 97%   BMI 31.74 kg/m    Examination  General - Not in acute distress, she responded to my " hello", made an eye contact HEENT- PERRLA, pale conjunctiva Chest - Clear bilateral air entry CVS - Normal s1s2, RRR Abdomen - soft Extremities - no pedal edema Skin - no rashes  RT shoulder in an arm sling, she complains of pain of her rt shoulder, tenderness on exam, restricted mobility +, surgical sites have staples and has some erythema   LINES/TUBES: PIVs only   METAL IMPLANT/HARDWARE:   Pertinent Lab seen by me: CBC Latest Ref Rng & Units 09/06/2019 08/28/2019 08/27/2019  WBC 4.0 - 10.5 K/uL 13.0(H) 9.3 10.8(H)  Hemoglobin 12.0 - 15.0 g/dL 9.9(L) 11.9(L) 10.4(L)  Hematocrit 36 - 46 % 31.5(L) 36.6 33.0(L)  Platelets 150 - 400 K/uL 283 145(L) 178   CMP Latest Ref Rng & Units 09/07/2019 09/06/2019 08/28/2019  Glucose 70 - 99 mg/dL 134(H) 172(H) 95  BUN 8 - 23 mg/dL 14 14 10   Creatinine 0.44 - 1.00 mg/dL 0.60 0.76 0.59  Sodium 135 - 145 mmol/L 138 137 139  Potassium 3.5 - 5.1 mmol/L 3.4(L) 3.2(L) 4.8  Chloride 98 - 111 mmol/L 107 105 105  CO2 22 - 32 mmol/L 19(L) 21(L) 23  Calcium 8.9 - 10.3 mg/dL 8.2(L) 8.1(L) 8.8(L)  Total Protein 6.5 - 8.1 g/dL 5.2(L) 5.8(L) -  Total Bilirubin 0.3 - 1.2 mg/dL  0.7 1.4(H) -  Alkaline Phos 38 - 126 U/L 148(H) 167(H) -  AST 15 - 41 U/L 49(H) 51(H) -  ALT 0 - 44 U/L 44 46(H) -   Erythrocyte Sedimentation Rate     Component Value Date/Time   ESRSEDRATE 35 (H) 07/07/2017 0447    Pertinent Imagings/Other Imagings Plain films and  CT images have been personally visualized and interpreted; radiology reports have been reviewed. Decision making incorporated into the Impression / Recommendations.  CT RT shoulder WO contrast 09/06/19 FINDINGS: Bones/Joint/Cartilage  Reverse shoulder arthroplasty. There is regional streak artifact. Allowing for artifact, no periprosthetic lucency. Humeral stem is midline. Limited assessment for joint effusion given surrounding streak artifact. No obvious effusion.  Ligaments  Suboptimally assessed by CT.  Muscles and Tendons  No evidence of intramuscular fluid collection. Muscle bulk maintained.  Soft tissues  Mild subcutaneous stranding posteriorly. Anterior skin staples in place. There is no subcutaneous fluid collection. No axillary adenopathy.  IMPRESSION: 1. Reverse shoulder arthroplasty. There is streak artifact from surgical hardware that limits regional evaluation. Allowing for this, no obvious joint effusion or findings to suspect septic joint. Joint aspiration could be considered if there is persistent clinical concern. 2. Mild subcutaneous stranding posteriorly may be postsurgical, no subcutaneous fluid collection.  US abdomen RUQ 09/06/19 FINDINGS: Gallbladder:  The gallbladder is absent  Common bile duct:  Diameter: 10 mm in proximal diameter. The distal duct is obscured by overlying bowel gas.  Liver:  Hepatic parenchymal echogenicity is diffusely, mildly increased suggesting changes of mild hepatic steatosis. No focal intrahepatic masses are seen. There is no intrahepatic biliary ductal dilation. Portal vein is patent on color Doppler imaging with normal direction of blood flow towards the liver.  Other: No ascites  IMPRESSION: Status post cholecystectomy. Mild dilation of the extrahepatic bile duct is nonspecific and may represent post cholecystectomy change.  CT abdomen/pelvis WO contrast 09/06/19 FINDINGS: Lower chest: No  acute abnormality.  Hepatobiliary: No focal liver abnormality is seen. Status post cholecystectomy. No biliary dilatation.  Pancreas: Unremarkable. No pancreatic ductal dilatation or surrounding inflammatory changes.  Spleen: Normal in size without focal abnormality.  Adrenals/Urinary Tract: Adrenal glands are stable in appearance. Mild hyperplasia in the left adrenal gland is seen. The kidneys demonstrate a tiny nonobstructing stone in the lower pole of the right kidney. No obstructive changes are seen. The bladder is decompressed.  Stomach/Bowel: Mild retained fecal material is noted within the colon. No obstructive or inflammatory changes are seen. The appendix is within normal limits. Small bowel and stomach are unremarkable.  Vascular/Lymphatic: Aortic atherosclerosis. No enlarged abdominal or pelvic lymph nodes.  Reproductive: Status post hysterectomy. No adnexal masses.  Other: No abdominal wall hernia or abnormality. No abdominopelvic ascites.  Musculoskeletal: Changes of prior vertebral augmentation are seen at L5. Chronic L1 compression deformity is seen.  IMPRESSION: Chronic changes similar to that seen on the prior exam. No acute abnormality correspond with the patient's given clinical symptomatology is noted.  CT Head 09/06/19  FINDINGS: Brain: Old left parietal infarct, stable. Stable old cerebellar infarcts bilaterally. There is atrophy and chronic small vessel disease changes. No acute intracranial abnormality. Specifically, no hemorrhage, hydrocephalus, mass lesion, acute infarction, or significant intracranial injury.  Vascular: No hyperdense vessel or unexpected calcification.  Skull: No acute calvarial abnormality.  Sinuses/Orbits: Visualized paranasal sinuses and mastoids clear. Orbital soft tissues unremarkable.  Other: None  IMPRESSION: Old left parietal and bilateral cerebellar infarcts.  RT knee 09/06/19 FINDINGS: No  evidence of acute fracture. No  malalignment. Likely small joint effusion, although obliquity of the lateral radiograph limits evaluation. Diffuse osteopenia. Joint spaces are relatively well maintained. Osteophytic spurring arising from bilateral femoral condyles. Superior patellar enthesophyte. Mild soft tissue swelling about the knee. Vascular calcifications.  IMPRESSION: No evidence of acute fracture or malalignment.  Chest Xray 09/06/19  FINDINGS: No new consolidation or edema. No pleural effusion. Similar cardiomediastinal contours. Interval right reverse shoulder arthroplasty.  IMPRESSION: No acute process in the chest.

## 2019-09-07 NOTE — Progress Notes (Addendum)
PHARMACY - PHYSICIAN COMMUNICATION CRITICAL VALUE ALERT - BLOOD CULTURE IDENTIFICATION (BCID)  4/4 blood cultures positive for Klebsiella aerogenes (formerly Enterobacter aerogenes)  Name of physician (or Provider) Contacted: Hosie Poisson  Changes to prescribed antibiotics required:  D/c vancomycin, ampicillin, ceftriaxone  Initiate cefepime for appropriate coverage.   Norina Buzzard, PharmD PGY1 Pharmacy Resident 09/07/2019 10:22 AM

## 2019-09-07 NOTE — Progress Notes (Signed)
PROGRESS NOTE    Samantha Aguirre  OZD:[id number] DOB: 05/17/1945 DOA: 09/06/2019 PCP: Tracie Harrier, MD    Chief Complaint  Patient presents with  . Altered Mental Status  . Fall    Brief Narrative: 74 year old lady prior history of paroxysmal atrial fibrillation, chronic diastolic heart failure, COPD, asthma, noninsulin-dependent type 2 diabetes mellitus, essential hypertension, hyperlipidemia, seizure disorder, history of CVA in the past, hypothyroidism, coronary artery disease, GERD, recent right shoulder arthroplasty on 08/25/2019 presents to ED from [facility] for evaluation of fever and altered mental status.  History of unwitnessed fall on Saturday.  Patient is confused and history was not available on admission.  On arrival to ED she was febrile tachypneic, had leukocytosis.  Blood cultures grew Klebsiella aerogenes in all the bottles. Chest x-ray does not show any pneumonia urine analysis is negative, CT of the head is negative for acute intracranial abnormality.  CT of the abdomen pelvis does not show any acute infectious source.  X-rays of the knee negative for acute fracture or malalignment. She was initially started on IV vancomycin, ampicillin and Rocephin for evaluation of meningitis, later on transitioned to IV cefepime for Klebsiella bacteremia.   Assessment & Plan:   Principal Problem:   AMS (altered mental status) Active Problems:   Acute on chronic anemia   Fever   SIRS (systemic inflammatory response syndrome) (HCC)   Hypokalemia  Sepsis with Klebsiella bacteremia Patient was empirically started on IV vancomycin, Rocephin and ampicillin later on transitioned to IV cefepime.  Follow sensitivities and narrow antibiotics as appropriate. Lactic acid is within normal limits. ID consulted for recommendations. Unclear source as her urine analysis is negative for infection.  CT of the abdomen and pelvis does not show any acute intra-abdominal  pathology.    Acute metabolic encephalopathy Probably secondary to sepsis vs from being off keppra. She was also found to have elevated ammonia levels with mildly elevated liver enzymes and elevated alkaline phosphatase.  SLP evaluation to see if she can safely swallow medications.   Seizures :  Pt has been off keppra for the last two weeks of unclear etiology.  Restart the keppra and topamax.   Anemia of chronic disease and anemia of blood loss probably from right shoulder arthroplasty. Baseline hemoglobin appears to be around 12 dropped to 11 post surgery and currently at 9.9 Slow iron levels and low folate levels.  Replace iron and folate.   Hypokalemia Replaced Repeat in the morning.   Essential hypertension:  Well controlled.    Type 2 DM:  CBG (last 3)  Recent Labs    09/06/19 1518 09/06/19 2128 09/07/19 0348  GLUCAP 172* 161* 115*   Resume SSI.     Copd:  No wheezing heard on exam.     Chronic diastolic heart failure Last echocardiogram from October 2020 shows left ventricular ejection fraction of 55 to 60% with normal function.  She appears compensated.  Lasix on hold for now.     Hypothyroidism Continue with Synthroid 125 MCG daily.   Hyperlipidemia Continue with Lipitor 80 mg daily.    History of coronary artery disease, CVA in the past. Patient is on aspirin and Plavix which have been on hold for possible LP.   DVT prophylaxis: SCDs Code Status: DNR Family Communication: None at bedside  disposition:   Status is: Inpatient  Remains inpatient appropriate because:Unsafe d/c plan and IV treatments appropriate due to intensity of illness or inability to take PO   Dispo: The patient is from:  Home              Anticipated d/c is to: Pending              Anticipated d/c date is: > 3 days              Patient currently is not medically stable to d/c.       Consultants:   Infectious disease  Procedures: None Antimicrobials:  ( Anti-infectives (From admission, onward)   Start     Dose/Rate Route Frequency Ordered Stop   09/07/19 1200  ceFEPIme (MAXIPIME) 2 g in sodium chloride 0.9 % 100 mL IVPB        2 g 200 mL/hr over 30 Minutes Intravenous Every 8 hours 09/07/19 1058     09/07/19 0800  vancomycin (VANCOREADY) IVPB 750 mg/150 mL  Status:  Discontinued        750 mg 150 mL/hr over 60 Minutes Intravenous Every 12 hours 09/06/19 1930 09/07/19 1100   09/07/19 0600  cefTRIAXone (ROCEPHIN) 2 g in sodium chloride 0.9 % 100 mL IVPB  Status:  Discontinued        2 g 200 mL/hr over 30 Minutes Intravenous Every 12 hours 09/06/19 2018 09/07/19 1045   09/06/19 2030  cefTRIAXone (ROCEPHIN) 1 g in sodium chloride 0.9 % 100 mL IVPB        1 g 200 mL/hr over 30 Minutes Intravenous  Once 09/06/19 2018 09/07/19 0132   09/06/19 2030  ampicillin (OMNIPEN) 2 g in sodium chloride 0.9 % 100 mL IVPB  Status:  Discontinued        2 g 300 mL/hr over 20 Minutes Intravenous Every 4 hours 09/06/19 2018 09/07/19 1100   09/06/19 2000  vancomycin (VANCOREADY) IVPB 1750 mg/350 mL        1,750 mg 175 mL/hr over 120 Minutes Intravenous  Once 09/06/19 1930 09/06/19 2238   09/06/19 1600  cefTRIAXone (ROCEPHIN) 1 g in sodium chloride 0.9 % 100 mL IVPB  Status:  Discontinued        1 g 200 mL/hr over 30 Minutes Intravenous Every 24 hours 09/06/19 1549 09/06/19 2018       Subjective: Continues to be confused.   Objective: Vitals:   09/07/19 0215 09/07/19 0318 09/07/19 0334 09/07/19 0813  BP: (!) 113/56  119/63 (!) 119/58  Pulse: 81  71 75  Resp: 17  18 (!) 22  Temp:  98.9 F (37.2 C) 98 F (36.7 C) 98.8 F (37.1 C)  TempSrc:  Oral Oral Oral  SpO2: 97%  100% 99%  Weight:      Height:        Intake/Output Summary (Last 24 hours) at 09/07/2019 1101 Last data filed at 09/07/2019 0600 Gross per 24 hour  Intake 1000 ml  Output --  Net 1000 ml   Filed Weights   09/06/19 1523  Weight: 76.2 kg    Examination:  General exam:  Appears calm and comfortable on 2 lit of [place] oxygen.  Respiratory system: Clear to auscultation. Respiratory effort normal. Cardiovascular system: S1 & S2 heard, RRR. No JVD, murmurs,  Gastrointestinal system: Abdomen is nondistended, soft and non tender, bowel sounds wnl.  Central nervous system: woke up to verbal cues , did not follow commands,  Extremities: no cyanosis or clubbing.  Skin: No rashes, lesions or ulcers Psychiatry:  Mood & affect appropriate.     Data Reviewed: I have personally reviewed following labs and imaging studies  CBC: Recent Labs  Lab 09/06/19 1617  WBC 13.0*  NEUTROABS 11.8*  HGB 9.9*  HCT 31.5*  MCV 92.6  PLT 409    Basic Metabolic Panel: Recent Labs  Lab 09/06/19 1617 09/06/19 2035 09/07/19 0557  NA 137  --  138  K 3.2*  --  3.4*  CL 105  --  107  CO2 21*  --  19*  GLUCOSE 172*  --  134*  BUN 14  --  14  CREATININE 0.76  --  0.60  CALCIUM 8.1*  --  8.2*  MG  --  1.7  --     GFR: Estimated Creatinine Clearance: 56.8 mL/min (by C-G formula based on SCr of 0.6 mg/dL).  Liver Function Tests: Recent Labs  Lab 09/06/19 1617 09/07/19 0557  AST 51* 49*  ALT 46* 44  ALKPHOS 167* 148*  BILITOT 1.4* 0.7  PROT 5.8* 5.2*  ALBUMIN 2.6* 2.3*    CBG: Recent Labs  Lab 09/06/19 1518 09/06/19 2128 09/07/19 0348  GLUCAP 172* 161* 115*     Recent Results (from the past 240 hour(s))  SARS Coronavirus 2 by RT PCR (hospital order, performed in [facility] hospital lab) Nasopharyngeal Nasopharyngeal Swab     Status: None   Collection Time: 08/29/19  8:35 AM   Specimen: Nasopharyngeal Swab  Result Value Ref Range Status   SARS Coronavirus 2 NEGATIVE NEGATIVE Final    Comment: (NOTE) SARS-CoV-2 target nucleic acids are NOT DETECTED.  The SARS-CoV-2 RNA is generally detectable in upper and lower respiratory specimens during the acute phase of infection. The lowest concentration of SARS-CoV-2 viral copies this assay can detect is  250 copies / mL. A negative result does not preclude SARS-CoV-2 infection and should not be used as the sole basis for treatment or other patient management decisions.  A negative result may occur with improper specimen collection / handling, submission of specimen other than nasopharyngeal swab, presence of viral mutation(s) within the areas targeted by this assay, and inadequate number of viral copies (<250 copies / mL). A negative result must be combined with clinical observations, patient history, and epidemiological information.  Fact Sheet for Patients:   StrictlyIdeas.no  Fact Sheet for Healthcare Providers: BankingDealers.co.za  This test is not yet approved or  cleared by the [place] FDA and has been authorized for detection and/or diagnosis of SARS-CoV-2 by FDA under an Emergency Use Authorization (EUA).  This EUA will remain in effect (meaning this test can be used) for the duration of the COVID-19 declaration under Section 564(b)(1) of the Act, 21 U.S.C. section 360bbb-3(b)(1), unless the authorization is terminated or revoked sooner.  Performed at [facility], [address] [place], [place] [address]   Blood Culture (routine x 2)     Status: None (Preliminary result)   Collection Time: 09/06/19  4:17 PM   Specimen: BLOOD LEFT FOREARM  Result Value Ref Range Status   Specimen Description BLOOD LEFT FOREARM  Final   Special Requests   Final    BOTTLES DRAWN AEROBIC AND ANAEROBIC Blood Culture adequate volume   Culture  Setup Time   Final    GRAM NEGATIVE RODS IN BOTH AEROBIC AND ANAEROBIC BOTTLES Organism ID to follow CRITICAL RESULT CALLED TO, READ BACK BY AND VERIFIED WITH: Molly Maduro PharmD 9:10 09/07/19 ([email or web address])    Culture   Final    NO GROWTH < 24 HOURS Performed at [facility], [place] [address], [place], [place] [address]    Report Status PENDING  Incomplete  Blood Culture ID Panel  (Reflexed)     Status: Abnormal   Collection Time: 09/06/19  4:17 PM  Result Value Ref Range Status   Enterococcus faecalis NOT DETECTED NOT DETECTED Final   Enterococcus Faecium NOT DETECTED NOT DETECTED Final   Listeria monocytogenes NOT DETECTED NOT DETECTED Final   Staphylococcus species NOT DETECTED NOT DETECTED Final   Staphylococcus aureus (BCID) NOT DETECTED NOT DETECTED Final   Staphylococcus epidermidis NOT DETECTED NOT DETECTED Final   Staphylococcus lugdunensis NOT DETECTED NOT DETECTED Final   Streptococcus species NOT DETECTED NOT DETECTED Final   Streptococcus agalactiae NOT DETECTED NOT DETECTED Final   Streptococcus pneumoniae NOT DETECTED NOT DETECTED Final   Streptococcus pyogenes NOT DETECTED NOT DETECTED Final   A.calcoaceticus-baumannii NOT DETECTED NOT DETECTED Final   Bacteroides fragilis NOT DETECTED NOT DETECTED Final   Enterobacterales DETECTED (A) NOT DETECTED Final    Comment: Enterobacterales represent a large order of gram negative bacteria, not a single organism. CRITICAL RESULT CALLED TO, READ BACK BY AND VERIFIED WITH: Molly Maduro PharmD 9:10 09/07/19 ([email or web address])    Enterobacter cloacae complex NOT DETECTED NOT DETECTED Final   Escherichia coli NOT DETECTED NOT DETECTED Final   Klebsiella aerogenes DETECTED (A) NOT DETECTED Final    Comment: CRITICAL RESULT CALLED TO, READ BACK BY AND VERIFIED WITH: Molly Maduro PharmD 9:10 09/07/19 ([email or web address])    Klebsiella oxytoca NOT DETECTED NOT DETECTED Final   Klebsiella pneumoniae NOT DETECTED NOT DETECTED Final   Proteus species NOT DETECTED NOT DETECTED Final   Salmonella species NOT DETECTED NOT DETECTED Final   Serratia marcescens NOT DETECTED NOT DETECTED Final   Haemophilus influenzae NOT DETECTED NOT DETECTED Final   Neisseria meningitidis NOT DETECTED NOT DETECTED Final   Pseudomonas aeruginosa NOT DETECTED NOT DETECTED Final   Stenotrophomonas maltophilia NOT DETECTED NOT DETECTED Final   Candida albicans NOT  DETECTED NOT DETECTED Final   Candida auris NOT DETECTED NOT DETECTED Final   Candida glabrata NOT DETECTED NOT DETECTED Final   Candida krusei NOT DETECTED NOT DETECTED Final   Candida parapsilosis NOT DETECTED NOT DETECTED Final   Candida tropicalis NOT DETECTED NOT DETECTED Final   Cryptococcus neoformans/gattii NOT DETECTED NOT DETECTED Final   CTX-M ESBL NOT DETECTED NOT DETECTED Final   Carbapenem resistance IMP NOT DETECTED NOT DETECTED Final   Carbapenem resistance KPC NOT DETECTED NOT DETECTED Final   Carbapenem resistance NDM NOT DETECTED NOT DETECTED Final   Carbapenem resist OXA 48 LIKE NOT DETECTED NOT DETECTED Final   Carbapenem resistance VIM NOT DETECTED NOT DETECTED Final    Comment: Performed at [facility], [address] [place], [place] [address]  SARS Coronavirus 2 by RT PCR (hospital order, performed in [facility] hospital lab) Nasopharyngeal Urine, Catheterized     Status: None   Collection Time: 09/06/19  4:18 PM   Specimen: Urine, Catheterized; Nasopharyngeal  Result Value Ref Range Status   SARS Coronavirus 2 NEGATIVE NEGATIVE Final    Comment: (NOTE) SARS-CoV-2 target nucleic acids are NOT DETECTED.  The SARS-CoV-2 RNA is generally detectable in upper and lower respiratory specimens during the acute phase of infection. The lowest concentration of SARS-CoV-2 viral copies this assay can detect is 250 copies / mL. A negative result does not preclude SARS-CoV-2 infection and should not be used as the sole basis for treatment or other patient management decisions.  A negative result may occur with improper specimen collection / handling, submission of specimen other than nasopharyngeal  swab, presence of viral mutation(s) within the areas targeted by this assay, and inadequate number of viral copies (<250 copies / mL). A negative result must be combined with clinical observations, patient history, and epidemiological information.  Fact Sheet for  Patients:   StrictlyIdeas.no  Fact Sheet for Healthcare Providers: BankingDealers.co.za  This test is not yet approved or  cleared by the [place] FDA and has been authorized for detection and/or diagnosis of SARS-CoV-2 by FDA under an Emergency Use Authorization (EUA).  This EUA will remain in effect (meaning this test can be used) for the duration of the COVID-19 declaration under Section 564(b)(1) of the Act, 21 U.S.C. section 360bbb-3(b)(1), unless the authorization is terminated or revoked sooner.  Performed at [facility], [place] [address], [place], [place] [address]   Blood Culture (routine x 2)     Status: None (Preliminary result)   Collection Time: 09/06/19  4:21 PM   Specimen: BLOOD  Result Value Ref Range Status   Specimen Description BLOOD LEFT UPPER ARM  Final   Special Requests   Final    BOTTLES DRAWN AEROBIC AND ANAEROBIC Blood Culture adequate volume   Culture  Setup Time   Final    GRAM NEGATIVE RODS IN BOTH AEROBIC AND ANAEROBIC BOTTLES CRITICAL VALUE NOTED.  VALUE IS CONSISTENT WITH PREVIOUSLY REPORTED AND CALLED VALUE.    Culture   Final    NO GROWTH < 24 HOURS Performed at [facility], [place] [address] [place], [place] [address]    Report Status PENDING  Incomplete         Radiology Studies: CT ABDOMEN PELVIS WO CONTRAST  Result Date: 09/06/2019 CLINICAL DATA:  Abdominal pain and fevers with elevated LFTs EXAM: CT ABDOMEN AND PELVIS WITHOUT CONTRAST TECHNIQUE: Multidetector CT imaging of the abdomen and pelvis was performed following the standard protocol without IV contrast. COMPARISON:  07/15/2017 FINDINGS: Lower chest: No acute abnormality. Hepatobiliary: No focal liver abnormality is seen. Status post cholecystectomy. No biliary dilatation. Pancreas: Unremarkable. No pancreatic ductal dilatation or surrounding inflammatory changes. Spleen: Normal in size without focal abnormality.  Adrenals/Urinary Tract: Adrenal glands are stable in appearance. Mild hyperplasia in the left adrenal gland is seen. The kidneys demonstrate a tiny nonobstructing stone in the lower pole of the right kidney. No obstructive changes are seen. The bladder is decompressed. Stomach/Bowel: Mild retained fecal material is noted within the colon. No obstructive or inflammatory changes are seen. The appendix is within normal limits. Small bowel and stomach are unremarkable. Vascular/Lymphatic: Aortic atherosclerosis. No enlarged abdominal or pelvic lymph nodes. Reproductive: Status post hysterectomy. No adnexal masses. Other: No abdominal wall hernia or abnormality. No abdominopelvic ascites. Musculoskeletal: Changes of prior vertebral augmentation are seen at L5. Chronic L1 compression deformity is seen. IMPRESSION: Chronic changes similar to that seen on the prior exam. No acute abnormality correspond with the patient's given clinical symptomatology is noted. Electronically Signed   By: Inez Catalina M.D.   On: 09/06/2019 17:48   CT Head Wo Contrast  Result Date: 09/06/2019 CLINICAL DATA:  Mental status changes. Unwitnessed fall, on blood thinners EXAM: CT HEAD WITHOUT CONTRAST TECHNIQUE: Contiguous axial images were obtained from the base of the skull through the vertex without intravenous contrast. COMPARISON:  06/15/2019 FINDINGS: Brain: Old left parietal infarct, stable. Stable old cerebellar infarcts bilaterally. There is atrophy and chronic small vessel disease changes. No acute intracranial abnormality. Specifically, no hemorrhage, hydrocephalus, mass lesion, acute infarction, or significant intracranial injury. Vascular: No hyperdense  vessel or unexpected calcification. Skull: No acute calvarial abnormality. Sinuses/Orbits: Visualized paranasal sinuses and mastoids clear. Orbital soft tissues unremarkable. Other: None IMPRESSION: Old left parietal and bilateral cerebellar infarcts. Atrophy, chronic microvascular  disease. No acute intracranial abnormality. Electronically Signed   By: Rolm Baptise M.D.   On: 09/06/2019 17:46   CT Shoulder Right Wo Contrast  Result Date: 09/06/2019 CLINICAL DATA:  Septic arthritis suspected, shoulder, xray done recent shoulder replacement, sepsis unknown source EXAM: CT OF THE UPPER RIGHT EXTREMITY WITHOUT CONTRAST TECHNIQUE: Multidetector CT imaging of the upper right extremity was performed according to the standard protocol. COMPARISON:  Most recent radiograph 08/25/2019 FINDINGS: Bones/Joint/Cartilage Reverse shoulder arthroplasty. There is regional streak artifact. Allowing for artifact, no periprosthetic lucency. Humeral stem is midline. Limited assessment for joint effusion given surrounding streak artifact. No obvious effusion. Ligaments Suboptimally assessed by CT. Muscles and Tendons No evidence of intramuscular fluid collection. Muscle bulk maintained. Soft tissues Mild subcutaneous stranding posteriorly. Anterior skin staples in place. There is no subcutaneous fluid collection. No axillary adenopathy. IMPRESSION: 1. Reverse shoulder arthroplasty. There is streak artifact from surgical hardware that limits regional evaluation. Allowing for this, no obvious joint effusion or findings to suspect septic joint. Joint aspiration could be considered if there is persistent clinical concern. 2. Mild subcutaneous stranding posteriorly may be postsurgical, no subcutaneous fluid collection. Electronically Signed   By: Keith Rake M.D.   On: 09/06/2019 22:07   DG Chest Port 1 View  Result Date: 09/06/2019 CLINICAL DATA:  Possible sepsis EXAM: PORTABLE CHEST 1 VIEW COMPARISON:  06/15/2019 FINDINGS: No new consolidation or edema. No pleural effusion. Similar cardiomediastinal contours. Interval right reverse shoulder arthroplasty. IMPRESSION: No acute process in the chest. Electronically Signed   By: Macy Mis M.D.   On: 09/06/2019 16:37   DG Knee Complete 4 Views  Right  Result Date: 09/06/2019 CLINICAL DATA:  Fall EXAM: RIGHT KNEE - COMPLETE 4+ VIEW COMPARISON:  November 13, 2015 FINDINGS: No evidence of acute fracture. No malalignment. Likely small joint effusion, although obliquity of the lateral radiograph limits evaluation. Diffuse osteopenia. Joint spaces are relatively well maintained. Osteophytic spurring arising from bilateral femoral condyles. Superior patellar enthesophyte. Mild soft tissue swelling about the knee. Vascular calcifications. IMPRESSION: No evidence of acute fracture or malalignment. Small joint effusion. Electronically Signed   By: Margaretha Sheffield MD   On: 09/06/2019 16:50   US Abdomen Limited RUQ  Result Date: 09/06/2019 CLINICAL DATA:  Elevated liver function tests EXAM: ULTRASOUND ABDOMEN LIMITED RIGHT UPPER QUADRANT COMPARISON:  None. FINDINGS: Gallbladder: The gallbladder is absent Common bile duct: Diameter: 10 mm in proximal diameter. The distal duct is obscured by overlying bowel gas. Liver: Hepatic parenchymal echogenicity is diffusely, mildly increased suggesting changes of mild hepatic steatosis. No focal intrahepatic masses are seen. There is no intrahepatic biliary ductal dilation. Portal vein is patent on color Doppler imaging with normal direction of blood flow towards the liver. Other: No ascites IMPRESSION: Status post cholecystectomy. Mild dilation of the extrahepatic bile duct is nonspecific and may represent post cholecystectomy change. Mild hepatic steatosis Electronically Signed   By: Fidela Salisbury MD   On: 09/06/2019 20:56        Scheduled Meds: . insulin aspart  0-9 Units Subcutaneous Q4H   Continuous Infusions: . ceFEPime (MAXIPIME) IV    . lactated ringers 125 mL/hr at 09/06/19 2035     LOS: 1 day       Hosie Poisson, MD [facility]   To contact  the attending provider between 7A-7P or the covering provider during after hours 7P-7A, please log into the web site [email or web address] and access  using universal [place] password for that web site. If you do not have the password, please call the hospital operator.  09/07/2019, 11:01 AM

## 2019-09-07 NOTE — Progress Notes (Signed)
  Echocardiogram 2D Echocardiogram has been performed with [facility].  Samantha Aguirre 09/07/2019, 4:30 PM

## 2019-09-08 ENCOUNTER — Inpatient Hospital Stay (HOSPITAL_COMMUNITY): Payer: Medicare Other

## 2019-09-08 LAB — BASIC METABOLIC PANEL
Anion gap: 11 (ref 5–15)
BUN: 15 mg/dL (ref 8–23)
CO2: 19 mmol/L — ABNORMAL LOW (ref 22–32)
Calcium: 8.3 mg/dL — ABNORMAL LOW (ref 8.9–10.3)
Chloride: 106 mmol/L (ref 98–111)
Creatinine, Ser: 0.62 mg/dL (ref 0.44–1.00)
GFR calc Af Amer: >60 mL/min (ref >60)
GFR calc non Af Amer: >60 mL/min (ref >60)
Glucose, Bld: 116 mg/dL — ABNORMAL HIGH (ref 70–99)
Potassium: 2.9 mmol/L — ABNORMAL LOW (ref 3.5–5.1)
Sodium: 136 mmol/L (ref 135–145)

## 2019-09-08 LAB — CBC WITH DIFFERENTIAL/PLATELET
Abs Immature Granulocytes: 0.03 10*3/uL (ref 0.00–0.07)
Basophils Absolute: 0 10*3/uL (ref 0.0–0.1)
Basophils Relative: 0 %
Eosinophils Absolute: 0 10*3/uL (ref 0.0–0.5)
Eosinophils Relative: 1 %
HCT: 31.9 % — ABNORMAL LOW (ref 36.0–46.0)
Hemoglobin: 10.5 g/dL — ABNORMAL LOW (ref 12.0–15.0)
Immature Granulocytes: 1 %
Lymphocytes Relative: 5 %
Lymphs Abs: 0.2 10*3/uL — ABNORMAL LOW (ref 0.7–4.0)
MCH: 29.7 pg (ref 26.0–34.0)
MCHC: 32.9 g/dL (ref 30.0–36.0)
MCV: 90.1 fL (ref 80.0–100.0)
Monocytes Absolute: 0.3 10*3/uL (ref 0.1–1.0)
Monocytes Relative: 7 %
Neutro Abs: 4 10*3/uL (ref 1.7–7.7)
Neutrophils Relative %: 86 %
Platelets: 202 10*3/uL (ref 150–400)
RBC: 3.54 MIL/uL — ABNORMAL LOW (ref 3.87–5.11)
RDW: 14.3 % (ref 11.5–15.5)
WBC: 4.6 10*3/uL (ref 4.0–10.5)
nRBC: 0 % (ref 0.0–0.2)

## 2019-09-08 LAB — GLUCOSE, CAPILLARY
Glucose-Capillary: 112 mg/dL — ABNORMAL HIGH (ref 70–99)
Glucose-Capillary: 131 mg/dL — ABNORMAL HIGH (ref 70–99)
Glucose-Capillary: 135 mg/dL — ABNORMAL HIGH (ref 70–99)
Glucose-Capillary: 138 mg/dL — ABNORMAL HIGH (ref 70–99)
Glucose-Capillary: 150 mg/dL — ABNORMAL HIGH (ref 70–99)
Glucose-Capillary: 166 mg/dL — ABNORMAL HIGH (ref 70–99)
Glucose-Capillary: 221 mg/dL — ABNORMAL HIGH (ref 70–99)

## 2019-09-08 LAB — MAGNESIUM: Magnesium: 1.9 mg/dL (ref 1.7–2.4)

## 2019-09-08 MED ORDER — POTASSIUM CHLORIDE 10 MEQ/100ML IV SOLN
10.0000 meq | INTRAVENOUS | Status: AC
  Administered 2019-09-08 (×2): 10 meq via INTRAVENOUS
  Filled 2019-09-08 (×2): qty 100

## 2019-09-08 MED ORDER — POTASSIUM CHLORIDE CRYS ER 20 MEQ PO TBCR
40.0000 meq | EXTENDED_RELEASE_TABLET | Freq: Once | ORAL | Status: AC
  Administered 2019-09-08: 40 meq via ORAL
  Filled 2019-09-08: qty 2

## 2019-09-08 NOTE — Progress Notes (Signed)
   [facility] Infectious Diseases Follow Up Note  Patient Identification: Patient Name: Samantha Aguirre MRN: [id number] [place] Date: 09/06/2019  3:14 PM Age: 74 y.o.  Today's Date: 09/08/2019  Principal Problem:   AMS (altered mental status) Active Problems:   Acute on chronic anemia   Fever   SIRS (systemic inflammatory response syndrome) (HCC)   Hypokalemia   Antibiotics:  cefepime Day 2  Assessment 74 year old female with multiple comorbidities with recent reverse shoulder arthroplasty on 8/12 with Ortho who presented with fever and AMS. ID following for  1. Klebsiella aerogenes bacteremia, Unclear source   2. Acute Encephalopathy, improving  - She was able to tell me that she in [facility]. She was more awake and alert today. Per RN , she seems to be more oriented  Recommendations  - Continue cefepime 2 g iv q8 hrs as is  - Fu [place] of RT shoulder  - If US of the rt shoulder has no effusion /concerns for infection, will plan for 2 weeks of therapy total given no definitive source - Following sensitivities on the Klebsiella aerogenes for final recommendations   Rest of the management as per the primary team. Thank you for the consult. Please page with pertinent questions or concerns.  Rosiland Oz, MD Infectious [facility]  ______________________________________________________________________ Subjective patient seen and examined at the bedside. She seems to be more awake, alert and oriented today. She also told me that she was having some stomach upset but did not have any diarrhea.   Objective BP 114/69 (BP Location: Left Arm)   Pulse 76   Temp 98.9 F (37.2 C) (Oral)   Resp 20   Ht 5\' 1"  (1.549 m)   Wt 76.2 kg   SpO2 97%   BMI 31.74 kg/m     General - Not in acute distress, more awake and conversative HEENT- PERRLA, pale conjunctiva Chest - Clear  bilateral air entry CVS - Normal s1s2, RRR Abdomen - soft Extremities - no pedal edema Skin - no rashes  RT shoulder in an arm sling  LINES/TUBES: PIVs only    Pertinent Lab. CBC Latest Ref Rng & Units 09/06/2019 08/28/2019 08/27/2019  WBC 4.0 - 10.5 K/uL 13.0(H) 9.3 10.8(H)  Hemoglobin 12.0 - 15.0 g/dL 9.9(L) 11.9(L) 10.4(L)  Hematocrit 36 - 46 % 31.5(L) 36.6 33.0(L)  Platelets 150 - 400 K/uL 283 145(L) 178   CMP Latest Ref Rng & Units 09/08/2019 09/07/2019 09/06/2019  Glucose 70 - 99 mg/dL 116(H) 134(H) 172(H)  BUN 8 - 23 mg/dL 15 14 14   Creatinine 0.44 - 1.00 mg/dL 0.62 0.60 0.76  Sodium 135 - 145 mmol/L 136 138 137  Potassium 3.5 - 5.1 mmol/L 2.9(L) 3.4(L) 3.2(L)  Chloride 98 - 111 mmol/L 106 107 105  CO2 22 - 32 mmol/L 19(L) 19(L) 21(L)  Calcium 8.9 - 10.3 mg/dL 8.3(L) 8.2(L) 8.1(L)  Total Protein 6.5 - 8.1 g/dL - 5.2(L) 5.8(L)  Total Bilirubin 0.3 - 1.2 mg/dL - 0.7 1.4(H)  Alkaline Phos 38 - 126 U/L - 148(H) 167(H)  AST 15 - 41 U/L - 49(H) 51(H)  ALT 0 - 44 U/L - 44 46(H)    Pertinent Imaging today Plain films and CT images have been personally visualized and interpreted; radiology reports have been reviewed. Decision making incorporated into the Impression / Recommendations.

## 2019-09-08 NOTE — Progress Notes (Signed)
Physical Therapy Treatment Patient Details Name: Samantha Aguirre MRN: [id number] DOB: 1945-07-30 Today's Date: 09/08/2019    History of Present Illness Samantha Aguirre is a 74 y.o. female with medical history significant of paroxysmal A. fib, chronic diastolic CHF, COPD, asthma, CAD, noninsulin-dependent type II diabetes, hypertension, hyperlipidemia, seizure disorder, CVA, hypothyroidism, GERD, recent reverse right total shoulder arthroplasty on 08/25/2019 presenting to the ED via EMS from [facility] for evaluation of fever and altered mental status.  Facility reported to EMS that patient was last known well on Friday.  It is reported that she had an unwitnessed fall on Saturday.  She had an abrasion noted to her knee, unknown head injury.    PT Comments    Patient received in bed. Rouses easily to her name.  Eyes open, but does not answer question. Will answer yes/no occasionally. She requires total +2 assist for supine>< sit. Once positioned in sitting on edge of bed she is able to maintain balance with min guard/assist to prevent lob posteriorly. She is unable to self correct. She is keeping her head flexed to her right and is unable to correct with cues. Requires assist and positioning to keep neck in neutral position. Patient will continue to benefit from skilled PT while here to improve functional mobility and independence.       Follow Up Recommendations  SNF;Supervision/Assistance - 24 hour     Equipment Recommendations  None recommended by PT    Recommendations for Other Services       Precautions / Restrictions Precautions Precautions: Fall Type of Shoulder Precautions: rev total shoulder recently Shoulder Interventions: Shoulder sling/immobilizer;Shoulder abduction pillow;Off for dressing/bathing/exercises Required Braces or Orthoses: Sling Restrictions Weight Bearing Restrictions: No RUE Weight Bearing: Non weight bearing    Mobility  Bed Mobility Overal bed  mobility: Needs Assistance Bed Mobility: Supine to Sit;Sit to Supine     Supine to sit: Max assist;+2 for physical assistance Sit to supine: Max assist;+2 for physical assistance   General bed mobility comments: Patient unable to initiate movement.  Transfers                 General transfer comment: not attempted due to lethargy, inability to follow commands.  Ambulation/Gait             General Gait Details: unable   Stairs             Wheelchair Mobility    Modified Rankin (Stroke Patients Only)       Balance Overall balance assessment: Needs assistance Sitting-balance support: Single extremity supported Sitting balance-Leahy Scale: Poor Sitting balance - Comments: patient is able to sit at edge of bed once assisted there and with min guard to prevent lob. Postural control: Posterior lean                                  Cognition Arousal/Alertness: Lethargic Behavior During Therapy: Flat affect Overall Cognitive Status: No family/caregiver present to determine baseline cognitive functioning                                 General Comments: Follows minimal cues for mobility, positioning      Exercises      General Comments        Pertinent Vitals/Pain Pain Assessment: Faces Faces Pain Scale: Hurts little more Pain Location: general with mobility Pain Descriptors /  Indicators: Grimacing Pain Intervention(s): Monitored during session;Repositioned    Home Living                      Prior Function            PT Goals (current goals can now be found in the care plan section) Acute Rehab PT Goals Patient Stated Goal: none stated PT Goal Formulation: Patient unable to participate in goal setting Time For Goal Achievement: 09/13/19 Progress towards PT goals: Progressing toward goals    Frequency    Min 3X/week      PT Plan Current plan remains appropriate    Co-evaluation               AM-PAC PT "6 Clicks" Mobility   Outcome Measure  Help needed turning from your back to your side while in a flat bed without using bedrails?: Total Help needed moving from lying on your back to sitting on the side of a flat bed without using bedrails?: Total Help needed moving to and from a bed to a chair (including a wheelchair)?: Total Help needed standing up from a chair using your arms (e.g., wheelchair or bedside chair)?: Total Help needed to walk in hospital room?: Total Help needed climbing 3-5 steps with a railing? : Total 6 Click Score: 6    End of Session   Activity Tolerance: Patient limited by lethargy Patient left: in bed;with call bell/phone within reach;with bed alarm set Nurse Communication: Mobility status PT Visit Diagnosis: Muscle weakness (generalized) (M62.81);Other abnormalities of gait and mobility (R26.89);History of falling (Z91.81)     Time: 8889-1694 PT Time Calculation (min) (ACUTE ONLY): 19 min  Charges:  $Therapeutic Activity: 8-22 mins                     Shone Leventhal, PT, GCS 09/08/19,2:40 PM

## 2019-09-08 NOTE — Evaluation (Signed)
Occupational Therapy Evaluation Patient Details Name: Samantha Aguirre MRN: [id number] DOB: 1945/07/01 Today's Date: 09/08/2019    History of Present Illness Samantha Aguirre is a 74 y.o. female with medical history significant of paroxysmal A. fib, chronic diastolic CHF, COPD, asthma, CAD, noninsulin-dependent type II diabetes, hypertension, hyperlipidemia, seizure disorder, CVA, hypothyroidism, GERD, recent reverse right total shoulder arthroplasty on 08/25/2019 presenting to the ED via EMS from [facility] for evaluation of fever and altered mental status.  Facility reported to EMS that patient was last known well on Friday.  It is reported that she had an unwitnessed fall on Saturday.  She had an abrasion noted to her knee, unknown head injury.   Clinical Impression   Patient admitted from SNF for above and limited by problem list below, including NWB and decreased functional use due to shoulder sx to R UE, impaired balance, impaired cognition and decreased activity tolerance. Patient lethargic during session, limited engagement but opens eyes to name, inconsistently voicing yes/no, but demonstrates decreased attention to task, unable to follow 1 step commands consistently and oriented to self only. Requires total assist +2 for bed mobility, min A to min guard sitting EOB but poor tolerance and posterior lean noted; repositioned sling on R UE to increase support and requires max assist hand over hand to wash face, otherwise needs total assist for all self care at this time.  Believe she will benefit from further OT services while admitted and after dc at SNF level to optimize independence and mobility in order to decrease burden of care.     Follow Up Recommendations  SNF    Equipment Recommendations  Other (comment) (TBD at next venue of care )    Recommendations for Other Services       Precautions / Restrictions Precautions Precautions: Fall Type of Shoulder Precautions: rev total  shoulder 08/25/19 Shoulder Interventions: Shoulder sling/immobilizer;Shoulder abduction pillow;At all times;Off for dressing/bathing/exercises Required Braces or Orthoses: Sling Restrictions Weight Bearing Restrictions: Yes RUE Weight Bearing: Non weight bearing      Mobility Bed Mobility Overal bed mobility: Needs Assistance Bed Mobility: Supine to Sit;Sit to Supine     Supine to sit: +2 for physical assistance;Total assist Sit to supine: +2 for physical assistance;Total assist   General bed mobility comments: requires total assist +2 using pads for modified helicopter technique to transition to/from EOB   Transfers                 General transfer comment: deferred due to safety     Balance Overall balance assessment: Needs assistance Sitting-balance support: No upper extremity supported;Feet supported Sitting balance-Leahy Scale: Poor Sitting balance - Comments: patient is able to sit at edge of bed once assisted there and with min guard to prevent lob. Postural control: Posterior lean                                 ADL either performed or assessed with clinical judgement   ADL Overall ADL's : Needs assistance/impaired     Grooming: Maximal assistance;Sitting Grooming Details (indicate cue type and reason): hand over hand support to wash face seated EOB with 2nd person support to maintain balance                             Functional mobility during ADLs: Maximal assistance;+2 for physical assistance;+2 for safety/equipment General ADL Comments: total assist  for all other ADLs at this time      Vision         Perception     Praxis      Pertinent Vitals/Pain Pain Assessment: Faces Faces Pain Scale: Hurts little more Pain Location: general with mobility Pain Descriptors / Indicators: Grimacing Pain Intervention(s): Limited activity within patient's tolerance;Monitored during session;Repositioned     Hand Dominance Right    Extremity/Trunk Assessment Upper Extremity Assessment Upper Extremity Assessment: RUE deficits/detail;LUE deficits/detail RUE Deficits / Details: recent R shoulder arthroplasty 8/12 in sling, intermittently able to squeeze hand but otherwise repositioned sling  RUE Coordination: decreased fine motor;decreased gross motor LUE Deficits / Details: demonstrates functional ROM, unable to use purposefully at this time due to cognition    Lower Extremity Assessment Lower Extremity Assessment: Defer to PT evaluation       Communication Communication Communication: Receptive difficulties;Expressive difficulties   Cognition Arousal/Alertness: Lethargic Behavior During Therapy: Flat affect Overall Cognitive Status: No family/caregiver present to determine baseline cognitive functioning Area of Impairment: Orientation;Attention;Memory;Following commands;Awareness;Safety/judgement;Problem solving                 Orientation Level: Disoriented to;Place;Time;Situation Current Attention Level: Focused Memory: Decreased recall of precautions;Decreased short-term memory Following Commands: Follows one step commands inconsistently Safety/Judgement: Decreased awareness of safety;Decreased awareness of deficits Awareness: Intellectual Problem Solving: Slow processing;Decreased initiation;Difficulty sequencing;Requires verbal cues;Requires tactile cues General Comments: follows minimal simple 1 step commands inconsistently with increased time, difficulty processing 2 step commands or attending to simple ADL tasks    General Comments       Exercises     Shoulder Instructions      Home Living Family/patient expects to be discharged to:: Skilled nursing facility                                        Prior Functioning/Environment Level of Independence: Independent with assistive device(s)        Comments: prior to shoulder surgery independent per chart using power chair  vs RW, but recent dc to SNF after surgery--pt unable to provide history today         OT Problem List: Decreased strength;Decreased range of motion;Decreased activity tolerance;Impaired balance (sitting and/or standing);Decreased safety awareness;Decreased knowledge of use of DME or AE;Impaired UE functional use;Decreased coordination;Decreased cognition;Decreased knowledge of precautions;Obesity;Pain      OT Treatment/Interventions: Self-care/ADL training;DME and/or AE instruction;Therapeutic exercise;Therapeutic activities;Cognitive remediation/compensation;Patient/family education;Balance training    OT Goals(Current goals can be found in the care plan section) Acute Rehab OT Goals Patient Stated Goal: none stated OT Goal Formulation: With patient Time For Goal Achievement: 09/22/19 Potential to Achieve Goals: Good  OT Frequency: Min 2X/week   Barriers to D/C:            Co-evaluation PT/OT/SLP Co-Evaluation/Treatment: Yes Reason for Co-Treatment: Complexity of the patient's impairments (multi-system involvement);Necessary to address cognition/behavior during functional activity;For patient/therapist safety;To address functional/ADL transfers   OT goals addressed during session: ADL's and self-care      AM-PAC OT "6 Clicks" Daily Activity     Outcome Measure Help from another person eating meals?: Total Help from another person taking care of personal grooming?: A Lot Help from another person toileting, which includes using toliet, bedpan, or urinal?: Total Help from another person bathing (including washing, rinsing, drying)?: Total Help from another person to put on and taking off regular upper body clothing?: Total Help  from another person to put on and taking off regular lower body clothing?: Total 6 Click Score: 7   End of Session Equipment Utilized During Treatment: Other (comment) (sling ) Nurse Communication: Mobility status;Precautions  Activity Tolerance:  Patient limited by lethargy Patient left: in bed;with call bell/phone within reach;with bed alarm set  OT Visit Diagnosis: Other abnormalities of gait and mobility (R26.89);Muscle weakness (generalized) (M62.81);Pain;Other symptoms and signs involving cognitive function Pain - part of body:  (generalized )                Time: 9539-6728 OT Time Calculation (min): 19 min Charges:  OT General Charges $OT Visit: 1 Visit OT Evaluation $OT Eval Moderate Complexity: 1 Mod  Jolaine Artist, OT Acute Rehabilitation Services Pager [phone] Office [phone]   Delight Stare 09/08/2019, 3:19 PM

## 2019-09-08 NOTE — NC FL2 (Signed)
[place] LEVEL OF CARE SCREENING TOOL     IDENTIFICATION  Patient Name: Samantha Aguirre Birthdate: 1945-02-23 Sex: female Admission Date (Current Location): 09/06/2019  [facility] and [place] Number:  Engineering geologist and Address:  The . [facility], [place] [address], [place], [place] [address]      Provider Number: [id number]  Attending Physician Name and Address:  Hosie Poisson, MD  Relative Name and Phone Number:       Current Level of Care: Hospital Recommended Level of Care: [place] Prior Approval Number:    Date Approved/Denied:   PASRR Number: [id number]  Discharge Plan: SNF    Current Diagnoses: Patient Active Problem List   Diagnosis Date Noted  . Bacteremia   . Sepsis with encephalopathy without septic shock ([place])   . AMS (altered mental status) 09/06/2019  . Fever 09/06/2019  . SIRS (systemic inflammatory response syndrome) ([place]) 09/06/2019  . Hypokalemia 09/06/2019  . Status post reverse total shoulder replacement, 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  . Stroke ([place]) 06/13/2018  . Hypotension 03/18/2018  . Malnutrition of moderate degree 03/16/2018  . Closed fracture of lumbar vertebral body ([place]) 03/15/2018  . Speaking difficulty 03/13/2018  . TIA (transient ischemic attack) 09/22/2017  . PAF (paroxysmal atrial fibrillation) ([place])   . Acute CVA (cerebrovascular accident) ([place]) 07/30/2017  . GI bleed 07/08/2017  . Acute on chronic anemia 07/08/2017  . Angiodysplasia of stomach and duodenum with hemorrhage   . CVA (cerebral vascular accident) ([place]) 07/06/2017  . Stroke (cerebrum) ([place])   . Goals of care, counseling/discussion   . Palliative care encounter   . COPD exacerbation ([place]) 04/03/2017  . Flu 03/27/2017  . Iron deficiency anemia due to chronic blood loss   . Migraine 02/13/2017  . Cataracts, bilateral 01/15/2017  . Chronic venous insufficiency 10/06/2016  . Recurrent major depressive disorder, in partial  remission ([place]) 10/06/2016  . Left-sided weakness 10/03/2016  . Chest pain 02/27/2016  . History of stroke 02/19/2016  . Thumb pain, left 01/22/2016  . Traumatic closed nondisplaced fracture of base of metacarpal bone of left thumb 01/22/2016  . Colon cancer screening 11/27/2015  . Depression 11/14/2015  . Coronary artery disease 11/13/2015  . Hypertension 11/13/2015  . History of skin cancer 11/13/2015  . Osteoporosis 11/13/2015  . Chronic low back pain 11/13/2015  . Left medial knee pain 11/13/2015  . Osteoarthritis of multiple joints 11/13/2015  . Other specified hypothyroidism 08/16/2015  . Controlled type 2 diabetes mellitus with diabetic neuropathy ([place]) 08/16/2015  . COPD (chronic obstructive pulmonary disease) ([place]) 08/16/2015  . Tobacco abuse 08/16/2015  . HLD (hyperlipidemia) 08/16/2015  . History of CHF (congestive heart failure) 08/16/2015  . Seizure disorder ([place])     Orientation RESPIRATION BLADDER Height & Weight     Self, Place  Normal Incontinent Weight: 167 lb 15.9 oz (76.2 kg) Height:  5\' 1"  (154.9 cm)  BEHAVIORAL SYMPTOMS/MOOD NEUROLOGICAL BOWEL NUTRITION STATUS      Continent Diet (heart healthy, carb modified)  AMBULATORY STATUS COMMUNICATION OF NEEDS Skin   Extensive Assist Verbally Surgical wounds (closed right shoulder, staples)                       Personal Care Assistance Level of Assistance  Bathing, Feeding, Dressing Bathing Assistance: Maximum assistance Feeding assistance: Independent Dressing Assistance: Maximum assistance     Functional Limitations Info             SPECIAL CARE FACTORS FREQUENCY  PT (By licensed PT), OT (By licensed OT)     PT Frequency: 5x/wk OT Frequency: 5x/wk            Contractures Contractures Info: Not present    Additional Factors Info  Code Status, Allergies, Psychotropic, Insulin Sliding Scale Code Status Info: DNR Allergies Info: Ivp Dye (Iodinated Diagnostic Agents), Methotrexate,  Morphine And Related, Mushroom Extract Complex, Atenolol, Percocet (Oxycodone-acetaminophen), Percodan (Oxycodone-aspirin), Tramadol, Verapamil, Oxycodone Hcl Psychotropic Info: Cymbalta 60mg  daily Insulin Sliding Scale Info: 0-9 units every 4 hours       Current Medications (09/08/2019):  This is the current hospital active medication list Current Facility-Administered Medications  Medication Dose Route Frequency Provider Last Rate Last Admin  . acetaminophen (TYLENOL) tablet 650 mg  650 mg Oral Q4H PRN Shela Leff, MD   650 mg at 09/08/19 0540   Or  . acetaminophen (TYLENOL) suppository 650 mg  650 mg Rectal Q4H PRN Shela Leff, MD   650 mg at 09/06/19 2334  . atorvastatin (LIPITOR) tablet 80 mg  80 mg Oral q1800 Hosie Poisson, MD   80 mg at 09/07/19 1812  . ceFEPIme (MAXIPIME) 2 g in sodium chloride 0.9 % 100 mL IVPB  2 g Intravenous Q8H Robertson, Crystal S, RPH 200 mL/hr at 09/08/19 0428 2 g at 09/08/19 0428  . DULoxetine (CYMBALTA) DR capsule 60 mg  60 mg Oral Daily Hosie Poisson, MD   60 mg at 09/08/19 1017  . ferrous sulfate tablet 325 mg  325 mg Oral BID WC Hosie Poisson, MD   325 mg at 09/08/19 1017  . folic acid (FOLVITE) tablet 1 mg  1 mg Oral Daily Hosie Poisson, MD   1 mg at 09/08/19 1017  . gabapentin (NEURONTIN) capsule 800 mg  800 mg Oral BID Hosie Poisson, MD   800 mg at 09/08/19 1017  . insulin aspart (novoLOG) injection 0-9 Units  0-9 Units Subcutaneous Q4H Shela Leff, MD   1 Units at 09/08/19 0830  . levETIRAcetam (KEPPRA) tablet 500 mg  500 mg Oral BID Hosie Poisson, MD   500 mg at 09/08/19 1017  . levothyroxine (SYNTHROID) tablet 125 mcg  125 mcg Oral Q0600 Hosie Poisson, MD   125 mcg at 09/08/19 0532  . potassium chloride 10 mEq in 100 mL IVPB  10 mEq Intravenous Q1 Hr x 2 Karleen Hampshire, Vijaya, MD      . potassium chloride SA (KLOR-CON) CR tablet 40 mEq  40 mEq Oral Once Hosie Poisson, MD      . topiramate (TOPAMAX) tablet 25 mg  25 mg Oral BID Hosie Poisson, MD   25 mg at 09/08/19 1017     Discharge Medications: Please see discharge summary for a list of discharge medications.  Relevant Imaging Results:  Relevant Lab Results:   Additional Information SS#: [id number]  Geralynn Ochs, LCSW

## 2019-09-08 NOTE — TOC Initial Note (Signed)
Transition of Care [facility]) - Initial/Assessment Note    Patient Details  Name: Samantha Aguirre MRN: [id number] Date of Birth: June 20, 1945  [facility]) CM/SW Contact:    Geralynn Ochs, LCSW Phone Number: 09/08/2019, 11:52 AM  Clinical Narrative:      CSW spoke with daughter over the phone to discuss discharge plans. Patient from [facility], but daughter does not want her to return due to concerns about care. Daughter prefers [facility] in [place]. CSW sent referral and asked admissions to review, awaiting response. CSW to follow.             Expected Discharge Plan: Skilled Nursing Facility Barriers to Discharge: Continued Medical Work up   Patient Goals and CMS Choice Patient states their goals for this hospitalization and ongoing recovery are:: patient unable to participate in goal setting due to disorientation CMS Medicare.gov Compare Post Acute Care list provided to:: Patient Represenative (must comment) Choice offered to / list presented to : Adult Children  Expected Discharge Plan and Services Expected Discharge Plan: [place] Acute Care Choice: [place] arrangements for the past 2 months: Single Family Home                                      Prior Living Arrangements/Services Living arrangements for the past 2 months: Single Family Home   Patient language and need for interpreter reviewed:: No Do you feel safe going back to the place where you live?: Yes      Need for Family Participation in Patient Care: Yes (Comment) Care giver support system in place?: No (comment)   Criminal Activity/Legal Involvement Pertinent to Current Situation/Hospitalization: No - Comment as needed  Activities of Daily Living      Permission Sought/Granted Permission sought to share information with : Facility Sport and exercise psychologist, Family Supports Permission granted to share information with : Yes,  Verbal Permission Granted  Share Information with NAME: Kimb[place]  Permission granted to share info w AGENCY: SNF  Permission granted to share info w Relationship: Daughter     Emotional Assessment   Attitude/Demeanor/Rapport: Unable to Assess Affect (typically observed): Unable to Assess Orientation: : Oriented to Self, Oriented to Place Alcohol / Substance Use: Not Applicable Psych Involvement: No (comment)  Admission diagnosis:  Hypokalemia [E87.6] Elevated LFTs [R79.89] Fall at nursing home, initial encounter [W19.Merril Abbe, Y92.129] AMS (altered mental status) [R41.82] Sepsis with encephalopathy without septic shock, due to unspecified organism ([place]) [A41.9, R65.20, G93.40] Patient Active Problem List   Diagnosis Date Noted  . Bacteremia   . Sepsis with encephalopathy without septic shock ([place])   . AMS (altered mental status) 09/06/2019  . Fever 09/06/2019  . SIRS (systemic inflammatory response syndrome) ([place]) 09/06/2019  . Hypokalemia 09/06/2019  . Status post reverse total shoulder replacement, 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  . Stroke ([place]) 06/13/2018  . Hypotension 03/18/2018  . Malnutrition of moderate degree 03/16/2018  . Closed fracture of lumbar vertebral body ([place]) 03/15/2018  . Speaking difficulty 03/13/2018  . TIA (transient ischemic attack) 09/22/2017  . PAF (paroxysmal atrial fibrillation) ([place])   . Acute CVA (cerebrovascular accident) ([place]) 07/30/2017  . GI bleed 07/08/2017  . Acute on chronic anemia 07/08/2017  . Angiodysplasia of stomach and duodenum with hemorrhage   . CVA (cerebral vascular accident) ([place]) 07/06/2017  . Stroke (cerebrum) ([place])   . Goals of care, counseling/discussion   .  Palliative care encounter   . COPD exacerbation ([place]) 04/03/2017  . Flu 03/27/2017  . Iron deficiency anemia due to chronic blood loss   . Migraine 02/13/2017  . Cataracts, bilateral 01/15/2017  . Chronic venous insufficiency 10/06/2016  . Recurrent major depressive disorder, in  partial remission ([place]) 10/06/2016  . Left-sided weakness 10/03/2016  . Chest pain 02/27/2016  . History of stroke 02/19/2016  . Thumb pain, left 01/22/2016  . Traumatic closed nondisplaced fracture of base of metacarpal bone of left thumb 01/22/2016  . Colon cancer screening 11/27/2015  . Depression 11/14/2015  . Coronary artery disease 11/13/2015  . Hypertension 11/13/2015  . History of skin cancer 11/13/2015  . Osteoporosis 11/13/2015  . Chronic low back pain 11/13/2015  . Left medial knee pain 11/13/2015  . Osteoarthritis of multiple joints 11/13/2015  . Other specified hypothyroidism 08/16/2015  . Controlled type 2 diabetes mellitus with diabetic neuropathy ([place]) 08/16/2015  . COPD (chronic obstructive pulmonary disease) ([place]) 08/16/2015  . Tobacco abuse 08/16/2015  . HLD (hyperlipidemia) 08/16/2015  . History of CHF (congestive heart failure) 08/16/2015  . Seizure disorder ([place])    PCP:  Tracie Harrier, MD Pharmacy:  No Pharmacies Listed    Social Determinants of Health (SDOH) Interventions    Readmission Risk Interventions Readmission Risk Prevention Plan 10/16/2018 06/15/2018  Transportation Screening Complete Complete  PCP or Specialist Appt within 5-7 Days Complete -  PCP or Specialist Appt within 3-5 Days - Complete  Home Care Screening Complete -  Medication Review (RN CM) Complete -  HRI or [place] - Complete  Social Work Consult for [place] Planning/Counseling - Not Complete  SW consult not completed comments - na  Palliative Care Screening - Not Applicable  Medication Review Press photographer) - Complete  Some recent data might be hidden

## 2019-09-08 NOTE — Progress Notes (Signed)
  Speech Language Pathology Treatment: Dysphagia  Patient Details Name: Cecelia Graciano MRN: [id number] DOB: 04/23/1945 Today's Date: 09/08/2019 Time: 4944-9675 SLP Time Calculation (min) (ACUTE ONLY): 13 min  Assessment / Plan / Recommendation Clinical Impression  Ms. Bala was seen with recently upgraded diet to dysphagia 1 and thin liquids with a baseline diet of mechanical soft-regular. Mentation remains decreased this date with noted lethargy. She was seen with purees and thin liquids fed by SLP without difficulty. With attempts at diet advancement to ground, pt was noted with significantly prolonged mastication and oral residue, requiring liquid wash to clear. Continue recommendation for dysphagia 1 diet and thin liquids; not yet ready for upgrade. SLP service to follow for upgrade once mentation improves and pt is appropriate to advance solids.    HPI HPI: Pt is a 74 yo female with recent fall, presenting from SNF with fever and AMS. She is admitted with sepsis of unclear source (CXR, CT abdomen, urine analysis negative). PMH includes: afib, CHF, COPD, asthma, DMII, HTN, HLD, seizure disorder, CVA, hypothyroidism, CAD, GERD, recent R shoulder arthroplasty on 08/25/19. Pt has had several previous swallow evaluations with functional appearing swallow with recommendations for mechanical soft to regular solids based on pt preference.      SLP Plan  Continue with current plan of care       Recommendations  Diet recommendations: Dysphagia 1 (puree);Thin liquid Liquids provided via: Cup;Straw Medication Administration: Crushed with puree Compensations: Minimize environmental distractions;Slow rate                Oral Care Recommendations: Oral care QID Follow up Recommendations: Skilled Nursing facility SLP Visit Diagnosis: Dysphagia, unspecified (R13.10) Plan: Continue with current plan of care                      Khylon Davies P. Arthur Aydelotte, M.S., [place]  Pathologist Acute Rehabilitation Services Pager: [place] 09/08/2019, 4:13 PM

## 2019-09-08 NOTE — Progress Notes (Signed)
PROGRESS NOTE    Samantha Aguirre  YSA:[id number] DOB: [birth year] DOA: 09/06/2019 PCP: Tracie Harrier, MD    Chief Complaint  Patient presents with  . Altered Mental Status  . Fall    Brief Narrative: 74 year old lady prior history of paroxysmal atrial fibrillation, chronic diastolic heart failure, COPD, asthma, noninsulin-dependent type 2 diabetes mellitus, essential hypertension, hyperlipidemia, seizure disorder, history of CVA in the past, hypothyroidism, coronary artery disease, GERD, recent right shoulder arthroplasty on 08/25/2019 presents to ED from [facility] for evaluation of fever and altered mental status.  History of unwitnessed fall on Saturday.  Patient is confused and history was not available on admission.  On arrival to ED she was febrile tachypneic, had leukocytosis.  Blood cultures grew Klebsiella aerogenes in all the bottles. Chest x-ray does not show any pneumonia urine analysis is negative, CT of the head is negative for acute intracranial abnormality.  CT of the abdomen pelvis does not show any acute infectious source.  X-rays of the knee negative for acute fracture or malalignment. She was initially started on IV vancomycin, ampicillin and Rocephin for evaluation of meningitis, later on transitioned to IV cefepime for Klebsiella bacteremia.  Patient seen and examined at bedside she is alert oriented to person and place this morning.  Was able to answer simple questions and following commands.  PT evaluation recommending SNF on discharge.  Meanwhile ID consulted for evaluation of Klebsiella bacteremia.  Echocardiogram ordered for evaluation of heart valves.  Ultrasound of the right shoulder ordered to see if she has any signs of septic joint.   Assessment & Plan:   Principal Problem:   AMS (altered mental status) Active Problems:   Acute on chronic anemia   Fever   SIRS (systemic inflammatory response syndrome) (HCC)   Hypokalemia   Bacteremia   Sepsis with  encephalopathy without septic shock ([place])  Sepsis with Klebsiella bacteremia Patient was empirically started on IV vancomycin, Rocephin and ampicillin later on transitioned to IV cefepime.  Follow sensitivities of Klebsiella bacteremia and narrow antibiotics as appropriate. Lactic acid is within normal limits. ID consulted for recommendations. Unclear source as her urine analysis is negative for infection.  CT of the abdomen and pelvis does not show any acute intra-abdominal pathology. Ultrasound of the right shoulder ordered for evaluation of abscess formation.    Acute metabolic encephalopathy Probably secondary to sepsis vs from being off keppra. She was also found to have elevated ammonia levels with mildly elevated liver enzymes and elevated alkaline phosphatase.  SLP evaluation to see if she can safely swallow medications. Appears to have improved. Patient is currently alert and oriented to place and person and answering simple questions and following some commands.  She also participated in physical therapy today.   Seizures :  Pt has been off keppra for the last two weeks of unclear etiology.  Restaredt the keppra and topamax.   Anemia of chronic disease and anemia of blood loss probably from right shoulder arthroplasty. Baseline hemoglobin appears to be around 12 dropped to 11 post surgery and currently at 9.9 L0W iron levels and low folate levels on anemia panel.  Replace iron and folate.   Hypokalemia Replaced and check magnesium levels.   Essential hypertension:  Well-controlled   Type 2 DM:  CBG (last 3)  Recent Labs    09/08/19 0414 09/08/19 0737 09/08/19 1151  GLUCAP 135* 131* 112*   Resume sliding scale insulin.    Copd:  No wheezing heard on exam.  Chronic diastolic heart failure Last echocardiogram from October 2020 shows left ventricular ejection fraction of 55 to 60% with normal function.  She appears compensated.  Lasix on hold for now.      Hypothyroidism Continue with Synthroid 125 MCG daily.   Hyperlipidemia Continue with Lipitor 80 mg daily.    History of coronary artery disease, CVA in the past. Patient is on aspirin and Plavix which have been on hold for possible LP.   Mild metabolic acidosis, non-anion gap Continue to monitor.   DVT prophylaxis: SCDs Code Status: DNR Family Communication: None at bedside discussed with son over the phone.  Disposition:   Status is: Inpatient  Remains inpatient appropriate because:Unsafe d/c plan and IV treatments appropriate due to intensity of illness or inability to take PO   Dispo: The patient is from: Home              Anticipated d/c is to: Pending              Anticipated d/c date is: > 3 days              Patient currently is not medically stable to d/c.       Consultants:   Infectious disease  Procedures: None Antimicrobials: ( Anti-infectives (From admission, onward)   Start     Dose/Rate Route Frequency Ordered Stop   09/07/19 1200  ceFEPIme (MAXIPIME) 2 g in sodium chloride 0.9 % 100 mL IVPB        2 g 200 mL/hr over 30 Minutes Intravenous Every 8 hours 09/07/19 1058     09/07/19 0800  vancomycin (VANCOREADY) IVPB 750 mg/150 mL  Status:  Discontinued        750 mg 150 mL/hr over 60 Minutes Intravenous Every 12 hours 09/06/19 1930 09/07/19 1100   09/07/19 0600  cefTRIAXone (ROCEPHIN) 2 g in sodium chloride 0.9 % 100 mL IVPB  Status:  Discontinued        2 g 200 mL/hr over 30 Minutes Intravenous Every 12 hours 09/06/19 2018 09/07/19 1045   09/06/19 2030  cefTRIAXone (ROCEPHIN) 1 g in sodium chloride 0.9 % 100 mL IVPB        1 g 200 mL/hr over 30 Minutes Intravenous  Once 09/06/19 2018 09/07/19 0132   09/06/19 2030  ampicillin (OMNIPEN) 2 g in sodium chloride 0.9 % 100 mL IVPB  Status:  Discontinued        2 g 300 mL/hr over 20 Minutes Intravenous Every 4 hours 09/06/19 2018 09/07/19 1100   09/06/19 2000  vancomycin (VANCOREADY) IVPB 1750  mg/350 mL        1,750 mg 175 mL/hr over 120 Minutes Intravenous  Once 09/06/19 1930 09/06/19 2238   09/06/19 1600  cefTRIAXone (ROCEPHIN) 1 g in sodium chloride 0.9 % 100 mL IVPB  Status:  Discontinued        1 g 200 mL/hr over 30 Minutes Intravenous Every 24 hours 09/06/19 1549 09/06/19 2018       Subjective: Alert and answering simple questions.  Objective: Vitals:   09/07/19 2316 09/08/19 0412 09/08/19 0803 09/08/19 1305  BP: (!) 131/53 (!) 135/54 114/69 139/67  Pulse: 83 87 76   Resp: 20 20 20    Temp: 100 F (37.8 C) (!) 101.3 F (38.5 C) 98.9 F (37.2 C) 98.4 F (36.9 C)  TempSrc: Oral Oral Oral Oral  SpO2: 97% 96% 97%   Weight:      Height:  Intake/Output Summary (Last 24 hours) at 09/08/2019 1356 Last data filed at 09/08/2019 1300 Gross per 24 hour  Intake 680 ml  Output 974 ml  Net -294 ml   Filed Weights   09/06/19 1523  Weight: 76.2 kg    Examination:  General exam: Calm and comfortable, not in any distress.Marland Kitchen  Respiratory system: Air entry fair, no wheezing or rhonchi Cardiovascular system: S1-S2 heard, regular rate rhythm, no JVD Gastrointestinal system: Abdomen is soft, nontender, nondistended, bowel sounds normal Central nervous system: He is alert and oriented to place and person, following some commands, and answering simple questions. Extremities: no cyanosis or clubbing.  Skin: No rashes seen Psychiatry: Mood is appropriate   Data Reviewed: I have personally reviewed following labs and imaging studies  CBC: Recent Labs  Lab 09/06/19 1617  WBC 13.0*  NEUTROABS 11.8*  HGB 9.9*  HCT 31.5*  MCV 92.6  PLT 161    Basic Metabolic Panel: Recent Labs  Lab 09/06/19 1617 09/06/19 2035 09/07/19 0557 09/08/19 0956  NA 137  --  138 136  K 3.2*  --  3.4* 2.9*  CL 105  --  107 106  CO2 21*  --  19* 19*  GLUCOSE 172*  --  134* 116*  BUN 14  --  14 15  CREATININE 0.76  --  0.60 0.62  CALCIUM 8.1*  --  8.2* 8.3*  MG  --  1.7  --    --     GFR: Estimated Creatinine Clearance: 56.8 mL/min (by C-G formula based on SCr of 0.62 mg/dL).  Liver Function Tests: Recent Labs  Lab 09/06/19 1617 09/07/19 0557  AST 51* 49*  ALT 46* 44  ALKPHOS 167* 148*  BILITOT 1.4* 0.7  PROT 5.8* 5.2*  ALBUMIN 2.6* 2.3*    CBG: Recent Labs  Lab 09/07/19 1950 09/08/19 0040 09/08/19 0414 09/08/19 0737 09/08/19 1151  GLUCAP 119* 150* 135* 131* 112*     Recent Results (from the past 240 hour(s))  Blood Culture (routine x 2)     Status: Abnormal (Preliminary result)   Collection Time: 09/06/19  4:17 PM   Specimen: BLOOD LEFT FOREARM  Result Value Ref Range Status   Specimen Description BLOOD LEFT FOREARM  Final   Special Requests   Final    BOTTLES DRAWN AEROBIC AND ANAEROBIC Blood Culture adequate volume   Culture  Setup Time   Final    GRAM NEGATIVE RODS IN BOTH AEROBIC AND ANAEROBIC BOTTLES Organism ID to follow CRITICAL RESULT CALLED TO, READ BACK BY AND VERIFIED WITH: Molly Maduro PharmD 9:10 09/07/19 ([email or web address])    Culture (A)  Final    ENTEROBACTER AEROGENES SUSCEPTIBILITIES TO FOLLOW Performed at [facility], [place] [address], [place], [place] [address]    Report Status PENDING  Incomplete  Blood Culture ID Panel (Reflexed)     Status: Abnormal   Collection Time: 09/06/19  4:17 PM  Result Value Ref Range Status   Enterococcus faecalis NOT DETECTED NOT DETECTED Final   Enterococcus Faecium NOT DETECTED NOT DETECTED Final   Listeria monocytogenes NOT DETECTED NOT DETECTED Final   Staphylococcus species NOT DETECTED NOT DETECTED Final   Staphylococcus aureus (BCID) NOT DETECTED NOT DETECTED Final   Staphylococcus epidermidis NOT DETECTED NOT DETECTED Final   Staphylococcus lugdunensis NOT DETECTED NOT DETECTED Final   Streptococcus species NOT DETECTED NOT DETECTED Final   Streptococcus agalactiae NOT DETECTED NOT DETECTED Final   Streptococcus pneumoniae NOT DETECTED NOT DETECTED Final  Streptococcus  pyogenes NOT DETECTED NOT DETECTED Final   A.calcoaceticus-baumannii NOT DETECTED NOT DETECTED Final   Bacteroides fragilis NOT DETECTED NOT DETECTED Final   Enterobacterales DETECTED (A) NOT DETECTED Final    Comment: Enterobacterales represent a large order of gram negative bacteria, not a single organism. CRITICAL RESULT CALLED TO, READ BACK BY AND VERIFIED WITH: Molly Maduro PharmD 9:10 09/07/19 ([email or web address])    Enterobacter cloacae complex NOT DETECTED NOT DETECTED Final   Escherichia coli NOT DETECTED NOT DETECTED Final   Klebsiella aerogenes DETECTED (A) NOT DETECTED Final    Comment: CRITICAL RESULT CALLED TO, READ BACK BY AND VERIFIED WITH: Molly Maduro PharmD 9:10 09/07/19 ([email or web address])    Klebsiella oxytoca NOT DETECTED NOT DETECTED Final   Klebsiella pneumoniae NOT DETECTED NOT DETECTED Final   Proteus species NOT DETECTED NOT DETECTED Final   Salmonella species NOT DETECTED NOT DETECTED Final   Serratia marcescens NOT DETECTED NOT DETECTED Final   Haemophilus influenzae NOT DETECTED NOT DETECTED Final   Neisseria meningitidis NOT DETECTED NOT DETECTED Final   Pseudomonas aeruginosa NOT DETECTED NOT DETECTED Final   Stenotrophomonas maltophilia NOT DETECTED NOT DETECTED Final   Candida albicans NOT DETECTED NOT DETECTED Final   Candida auris NOT DETECTED NOT DETECTED Final   Candida glabrata NOT DETECTED NOT DETECTED Final   Candida krusei NOT DETECTED NOT DETECTED Final   Candida parapsilosis NOT DETECTED NOT DETECTED Final   Candida tropicalis NOT DETECTED NOT DETECTED Final   Cryptococcus neoformans/gattii NOT DETECTED NOT DETECTED Final   CTX-M ESBL NOT DETECTED NOT DETECTED Final   Carbapenem resistance IMP NOT DETECTED NOT DETECTED Final   Carbapenem resistance KPC NOT DETECTED NOT DETECTED Final   Carbapenem resistance NDM NOT DETECTED NOT DETECTED Final   Carbapenem resist OXA 48 LIKE NOT DETECTED NOT DETECTED Final   Carbapenem resistance VIM NOT DETECTED NOT DETECTED Final     Comment: Performed at [facility], [address], [place], [place] [address]  Urine culture     Status: None   Collection Time: 09/06/19  4:18 PM   Specimen: In/Out Cath Urine  Result Value Ref Range Status   Specimen Description IN/OUT CATH URINE  Final   Special Requests NONE  Final   Culture   Final    NO GROWTH Performed at [facility], [place] [address] [place], [place] [address]    Report Status 09/07/2019 FINAL  Final  SARS Coronavirus 2 by RT PCR (hospital order, performed in [facility] hospital lab) Nasopharyngeal Urine, Catheterized     Status: None   Collection Time: 09/06/19  4:18 PM   Specimen: Urine, Catheterized; Nasopharyngeal  Result Value Ref Range Status   SARS Coronavirus 2 NEGATIVE NEGATIVE Final    Comment: (NOTE) SARS-CoV-2 target nucleic acids are NOT DETECTED.  The SARS-CoV-2 RNA is generally detectable in upper and lower respiratory specimens during the acute phase of infection. The lowest concentration of SARS-CoV-2 viral copies this assay can detect is 250 copies / mL. A negative result does not preclude SARS-CoV-2 infection and should not be used as the sole basis for treatment or other patient management decisions.  A negative result may occur with improper specimen collection / handling, submission of specimen other than nasopharyngeal swab, presence of viral mutation(s) within the areas targeted by this assay, and inadequate number of viral copies (<250 copies / mL). A negative result must be combined with clinical observations, patient history, and epidemiological information.  Fact Sheet for Patients:  StrictlyIdeas.no  Fact Sheet for Healthcare Providers: BankingDealers.co.za  This test is not yet approved or  cleared by the [place] FDA and has been authorized for detection and/or diagnosis of SARS-CoV-2 by FDA under an Emergency Use Authorization (EUA).  This EUA will  remain in effect (meaning this test can be used) for the duration of the COVID-19 declaration under Section 564(b)(1) of the Act, 21 U.S.C. section 360bbb-3(b)(1), unless the authorization is terminated or revoked sooner.  Performed at [facility], [place] [address] [place], [place] [address]   Blood Culture (routine x 2)     Status: Abnormal (Preliminary result)   Collection Time: 09/06/19  4:21 PM   Specimen: BLOOD  Result Value Ref Range Status   Specimen Description BLOOD LEFT UPPER ARM  Final   Special Requests   Final    BOTTLES DRAWN AEROBIC AND ANAEROBIC Blood Culture adequate volume   Culture  Setup Time   Final    GRAM NEGATIVE RODS IN BOTH AEROBIC AND ANAEROBIC BOTTLES CRITICAL VALUE NOTED.  VALUE IS CONSISTENT WITH PREVIOUSLY REPORTED AND CALLED VALUE. Performed at [facility], [place] [address] [place], [place] [address]    Culture ENTEROBACTER AEROGENES (A)  Final   Report Status PENDING  Incomplete         Radiology Studies: CT ABDOMEN PELVIS WO CONTRAST  Result Date: 09/06/2019 CLINICAL DATA:  Abdominal pain and fevers with elevated LFTs EXAM: CT ABDOMEN AND PELVIS WITHOUT CONTRAST TECHNIQUE: Multidetector CT imaging of the abdomen and pelvis was performed following the standard protocol without IV contrast. COMPARISON:  07/15/2017 FINDINGS: Lower chest: No acute abnormality. Hepatobiliary: No focal liver abnormality is seen. Status post cholecystectomy. No biliary dilatation. Pancreas: Unremarkable. No pancreatic ductal dilatation or surrounding inflammatory changes. Spleen: Normal in size without focal abnormality. Adrenals/Urinary Tract: Adrenal glands are stable in appearance. Mild hyperplasia in the left adrenal gland is seen. The kidneys demonstrate a tiny nonobstructing stone in the lower pole of the right kidney. No obstructive changes are seen. The bladder is decompressed. Stomach/Bowel: Mild retained fecal material is noted within the colon. No  obstructive or inflammatory changes are seen. The appendix is within normal limits. Small bowel and stomach are unremarkable. Vascular/Lymphatic: Aortic atherosclerosis. No enlarged abdominal or pelvic lymph nodes. Reproductive: Status post hysterectomy. No adnexal masses. Other: No abdominal wall hernia or abnormality. No abdominopelvic ascites. Musculoskeletal: Changes of prior vertebral augmentation are seen at L5. Chronic L1 compression deformity is seen. IMPRESSION: Chronic changes similar to that seen on the prior exam. No acute abnormality correspond with the patient's given clinical symptomatology is noted. Electronically Signed   By: Inez Catalina M.D.   On: 09/06/2019 17:48   CT Head Wo Contrast  Result Date: 09/06/2019 CLINICAL DATA:  Mental status changes. Unwitnessed fall, on blood thinners EXAM: CT HEAD WITHOUT CONTRAST TECHNIQUE: Contiguous axial images were obtained from the base of the skull through the vertex without intravenous contrast. COMPARISON:  06/15/2019 FINDINGS: Brain: Old left parietal infarct, stable. Stable old cerebellar infarcts bilaterally. There is atrophy and chronic small vessel disease changes. No acute intracranial abnormality. Specifically, no hemorrhage, hydrocephalus, mass lesion, acute infarction, or significant intracranial injury. Vascular: No hyperdense vessel or unexpected calcification. Skull: No acute calvarial abnormality. Sinuses/Orbits: Visualized paranasal sinuses and mastoids clear. Orbital soft tissues unremarkable. Other: None IMPRESSION: Old left parietal and bilateral cerebellar infarcts. Atrophy, chronic microvascular disease. No acute intracranial abnormality. Electronically Signed   By: Rolm Baptise M.D.   On:  09/06/2019 17:46   CT Shoulder Right Wo Contrast  Result Date: 09/06/2019 CLINICAL DATA:  Septic arthritis suspected, shoulder, xray done recent shoulder replacement, sepsis unknown source EXAM: CT OF THE UPPER RIGHT EXTREMITY WITHOUT CONTRAST  TECHNIQUE: Multidetector CT imaging of the upper right extremity was performed according to the standard protocol. COMPARISON:  Most recent radiograph 08/25/2019 FINDINGS: Bones/Joint/Cartilage Reverse shoulder arthroplasty. There is regional streak artifact. Allowing for artifact, no periprosthetic lucency. Humeral stem is midline. Limited assessment for joint effusion given surrounding streak artifact. No obvious effusion. Ligaments Suboptimally assessed by CT. Muscles and Tendons No evidence of intramuscular fluid collection. Muscle bulk maintained. Soft tissues Mild subcutaneous stranding posteriorly. Anterior skin staples in place. There is no subcutaneous fluid collection. No axillary adenopathy. IMPRESSION: 1. Reverse shoulder arthroplasty. There is streak artifact from surgical hardware that limits regional evaluation. Allowing for this, no obvious joint effusion or findings to suspect septic joint. Joint aspiration could be considered if there is persistent clinical concern. 2. Mild subcutaneous stranding posteriorly may be postsurgical, no subcutaneous fluid collection. Electronically Signed   By: Keith Rake M.D.   On: 09/06/2019 22:07   DG Chest Port 1 View  Result Date: 09/06/2019 CLINICAL DATA:  Possible sepsis EXAM: PORTABLE CHEST 1 VIEW COMPARISON:  06/15/2019 FINDINGS: No new consolidation or edema. No pleural effusion. Similar cardiomediastinal contours. Interval right reverse shoulder arthroplasty. IMPRESSION: No acute process in the chest. Electronically Signed   By: Macy Mis M.D.   On: 09/06/2019 16:37   DG Knee Complete 4 Views Right  Result Date: 09/06/2019 CLINICAL DATA:  Fall EXAM: RIGHT KNEE - COMPLETE 4+ VIEW COMPARISON:  November 13, 2015 FINDINGS: No evidence of acute fracture. No malalignment. Likely small joint effusion, although obliquity of the lateral radiograph limits evaluation. Diffuse osteopenia. Joint spaces are relatively well maintained. Osteophytic spurring  arising from bilateral femoral condyles. Superior patellar enthesophyte. Mild soft tissue swelling about the knee. Vascular calcifications. IMPRESSION: No evidence of acute fracture or malalignment. Small joint effusion. Electronically Signed   By: Margaretha Sheffield MD   On: 09/06/2019 16:50   ECHOCARDIOGRAM COMPLETE  Result Date: 09/07/2019    ECHOCARDIOGRAM REPORT   Patient Name:   KAWEHI HOSTETTER Date of Exam: 09/07/2019 Medical Rec #:  [id number]        Height:       61.0 in Accession #:    [id number]       Weight:       168.0 lb Date of Birth:  1945/03/05        BSA:          1.754 m Patient Age:    22 years         BP:           118/61 mmHg Patient Gender: F                HR:           69 bpm. Exam Location:  Inpatient Procedure: 2D Echo, Cardiac Doppler, Color Doppler and Intracardiac            Opacification Agent Indications:    Bacteremia  History:        Patient has prior history of Echocardiogram examinations, most                 recent 03/14/2018. CHF, CAD, COPD, Arrythmias:Atrial Fibrillation;                 Risk Factors:Diabetes and Current Smoker.  Sonographer:    Clayton Lefort RDCS (AE) Referring Phys: Eunice Blase [facility]  Sonographer Comments: Technically challenging study due to limited acoustic windows, Technically difficult study due to poor echo windows, suboptimal parasternal window, suboptimal apical window and patient is morbidly obese. Suboptimal image quality for  evaluation of valves in multiple views. IMPRESSIONS  1. Left ventricular ejection fraction, by estimation, is 55 to 60%. The left ventricle has normal function. The left ventricle has no regional wall motion abnormalities. Left ventricular diastolic parameters were normal.  2. Right ventricular systolic function is normal. The right ventricular size is mildly enlarged. Tricuspid regurgitation signal is inadequate for assessing PA pressure.  3. The mitral valve is grossly normal. No evidence of mitral valve regurgitation. No  evidence of mitral stenosis.  4. The aortic valve is grossly normal. Aortic valve regurgitation is not visualized. Mild aortic valve sclerosis is present, with no evidence of aortic valve stenosis.  5. The inferior vena cava is dilated in size with >50% respiratory variability, suggesting right atrial pressure of 8 mmHg. Comparison(s): No significant change from prior study. Conclusion(s)/Recommendation(s): No evidence of valvular vegetations on this transthoracic echocardiogram. Would recommend a transesophageal echocardiogram to exclude infective endocarditis if clinically indicated. FINDINGS  Left Ventricle: Left ventricular ejection fraction, by estimation, is 55 to 60%. The left ventricle has normal function. The left ventricle has no regional wall motion abnormalities. The left ventricular internal cavity size was normal in size. There is  no left ventricular hypertrophy. Left ventricular diastolic parameters were normal. Right Ventricle: The right ventricular size is mildly enlarged. No increase in right ventricular wall thickness. Right ventricular systolic function is normal. Tricuspid regurgitation signal is inadequate for assessing PA pressure. Left Atrium: Left atrial size was normal in size. Right Atrium: Right atrial size was normal in size. Pericardium: There is no evidence of pericardial effusion. Presence of pericardial fat pad. Mitral Valve: The mitral valve is grossly normal. No evidence of mitral valve regurgitation. No evidence of mitral valve stenosis. Tricuspid Valve: The tricuspid valve is grossly normal. Tricuspid valve regurgitation is trivial. No evidence of tricuspid stenosis. Aortic Valve: The aortic valve is grossly normal. Aortic valve regurgitation is not visualized. Mild aortic valve sclerosis is present, with no evidence of aortic valve stenosis. Aortic valve mean gradient measures 4.5 mmHg. Aortic valve peak gradient measures 8.6 mmHg. Aortic valve area, by VTI measures 2.47 cm.  Pulmonic Valve: The pulmonic valve was grossly normal. Pulmonic valve regurgitation is not visualized. No evidence of pulmonic stenosis. Aorta: The aortic root and ascending aorta are structurally normal, with no evidence of dilitation. Venous: The inferior vena cava is dilated in size with greater than 50% respiratory variability, suggesting right atrial pressure of 8 mmHg. IAS/Shunts: The atrial septum is grossly normal. EKG: Rhythm strip during this exam demostrated normal sinus rhythm and premature atrial contractions.  LEFT VENTRICLE PLAX 2D LVIDd:         5.00 cm  Diastology LVIDs:         3.70 cm  LV e' lateral:   9.79 cm/s LV PW:         1.10 cm  LV E/e' lateral: 9.9 LV IVS:        1.00 cm  LV e' medial:    8.49 cm/s LVOT diam:     2.00 cm  LV E/e' medial:  11.4 LV SV:         71 LV SV Index:   40 LVOT Area:     3.14  cm  RIGHT VENTRICLE             IVC RV Basal diam:  3.80 cm     IVC diam: 2.40 cm RV Mid diam:    2.80 cm RV S prime:     16.40 cm/s TAPSE (M-mode): 2.5 cm LEFT ATRIUM             Index       RIGHT ATRIUM           Index LA diam:        2.40 cm 1.37 cm/m  RA Area:     15.70 cm LA Vol (A2C):   35.3 ml 20.13 ml/m RA Volume:   41.20 ml  23.49 ml/m LA Vol (A4C):   47.0 ml 26.80 ml/m LA Biplane Vol: 43.8 ml 24.97 ml/m  AORTIC VALVE AV Area (Vmax):    2.82 cm AV Area (Vmean):   2.48 cm AV Area (VTI):     2.47 cm AV Vmax:           147.00 cm/s AV Vmean:          95.200 cm/s AV VTI:            0.287 m AV Peak Grad:      8.6 mmHg AV Mean Grad:      4.5 mmHg LVOT Vmax:         131.75 cm/s LVOT Vmean:        75.250 cm/s LVOT VTI:          0.226 m LVOT/AV VTI ratio: 0.79  AORTA Ao Root diam: 3.00 cm Ao Asc diam:  2.80 cm MV E velocity: 96.74 cm/s MV A velocity: 96.74 cm/s  SHUNTS MV E/A ratio:  1.00        Systemic VTI:  0.23 m                            Systemic Diam: 2.00 cm Eleonore Chiquito MD Electronically signed by Eleonore Chiquito MD Signature Date/Time: 09/07/2019/5:06:51 PM    Final    US  Abdomen Limited RUQ  Result Date: 09/06/2019 CLINICAL DATA:  Elevated liver function tests EXAM: ULTRASOUND ABDOMEN LIMITED RIGHT UPPER QUADRANT COMPARISON:  None. FINDINGS: Gallbladder: The gallbladder is absent Common bile duct: Diameter: 10 mm in proximal diameter. The distal duct is obscured by overlying bowel gas. Liver: Hepatic parenchymal echogenicity is diffusely, mildly increased suggesting changes of mild hepatic steatosis. No focal intrahepatic masses are seen. There is no intrahepatic biliary ductal dilation. Portal vein is patent on color Doppler imaging with normal direction of blood flow towards the liver. Other: No ascites IMPRESSION: Status post cholecystectomy. Mild dilation of the extrahepatic bile duct is nonspecific and may represent post cholecystectomy change. Mild hepatic steatosis Electronically Signed   By: Fidela Salisbury MD   On: 09/06/2019 20:56        Scheduled Meds: . atorvastatin  80 mg Oral q1800  . DULoxetine  60 mg Oral Daily  . ferrous sulfate  325 mg Oral BID WC  . folic acid  1 mg Oral Daily  . gabapentin  800 mg Oral BID  . insulin aspart  0-9 Units Subcutaneous Q4H  . levETIRAcetam  500 mg Oral BID  . levothyroxine  125 mcg Oral Q0600  . topiramate  25 mg Oral BID   Continuous Infusions: . ceFEPime (MAXIPIME) IV 2 g (09/08/19 1305)  . potassium chloride  LOS: 2 days       Hosie Poisson, MD [facility]   To contact the attending provider between 7A-7P or the covering provider during after hours 7P-7A, please log into the web site [email or web address] and access using universal [place] password for that web site. If you do not have the password, please call the hospital operator.  09/08/2019, 1:56 PM

## 2019-09-09 LAB — COMPREHENSIVE METABOLIC PANEL
ALT: 31 U/L (ref 0–44)
AST: 31 U/L (ref 15–41)
Albumin: 1.9 g/dL — ABNORMAL LOW (ref 3.5–5.0)
Alkaline Phosphatase: 127 U/L — ABNORMAL HIGH (ref 38–126)
Anion gap: 9 (ref 5–15)
BUN: 15 mg/dL (ref 8–23)
CO2: 19 mmol/L — ABNORMAL LOW (ref 22–32)
Calcium: 8 mg/dL — ABNORMAL LOW (ref 8.9–10.3)
Chloride: 108 mmol/L (ref 98–111)
Creatinine, Ser: 0.5 mg/dL (ref 0.44–1.00)
GFR calc Af Amer: >60 mL/min (ref >60)
GFR calc non Af Amer: >60 mL/min (ref >60)
Glucose, Bld: 146 mg/dL — ABNORMAL HIGH (ref 70–99)
Potassium: 3.3 mmol/L — ABNORMAL LOW (ref 3.5–5.1)
Sodium: 136 mmol/L (ref 135–145)
Total Bilirubin: 0.4 mg/dL (ref 0.3–1.2)
Total Protein: 5.2 g/dL — ABNORMAL LOW (ref 6.5–8.1)

## 2019-09-09 LAB — CBC WITH DIFFERENTIAL/PLATELET
Abs Immature Granulocytes: 0.03 10*3/uL (ref 0.00–0.07)
Basophils Absolute: 0 10*3/uL (ref 0.0–0.1)
Basophils Relative: 0 %
Eosinophils Absolute: 0.1 10*3/uL (ref 0.0–0.5)
Eosinophils Relative: 2 %
HCT: 27.7 % — ABNORMAL LOW (ref 36.0–46.0)
Hemoglobin: 8.9 g/dL — ABNORMAL LOW (ref 12.0–15.0)
Immature Granulocytes: 1 %
Lymphocytes Relative: 7 %
Lymphs Abs: 0.3 10*3/uL — ABNORMAL LOW (ref 0.7–4.0)
MCH: 28.5 pg (ref 26.0–34.0)
MCHC: 32.1 g/dL (ref 30.0–36.0)
MCV: 88.8 fL (ref 80.0–100.0)
Monocytes Absolute: 0.5 10*3/uL (ref 0.1–1.0)
Monocytes Relative: 12 %
Neutro Abs: 3 10*3/uL (ref 1.7–7.7)
Neutrophils Relative %: 78 %
Platelets: 183 10*3/uL (ref 150–400)
RBC: 3.12 MIL/uL — ABNORMAL LOW (ref 3.87–5.11)
RDW: 14.5 % (ref 11.5–15.5)
WBC: 3.9 10*3/uL — ABNORMAL LOW (ref 4.0–10.5)
nRBC: 0 % (ref 0.0–0.2)

## 2019-09-09 LAB — CULTURE, BLOOD (ROUTINE X 2)
Special Requests: ADEQUATE
Special Requests: ADEQUATE

## 2019-09-09 LAB — GLUCOSE, CAPILLARY
Glucose-Capillary: 137 mg/dL — ABNORMAL HIGH (ref 70–99)
Glucose-Capillary: 125 mg/dL — ABNORMAL HIGH (ref 70–99)
Glucose-Capillary: 116 mg/dL — ABNORMAL HIGH (ref 70–99)
Glucose-Capillary: 98 mg/dL (ref 70–99)
Glucose-Capillary: 97 mg/dL (ref 70–99)
Glucose-Capillary: 113 mg/dL — ABNORMAL HIGH (ref 70–99)

## 2019-09-09 LAB — MAGNESIUM: Magnesium: 2 mg/dL (ref 1.7–2.4)

## 2019-09-09 MED ORDER — ADULT MULTIVITAMIN W/MINERALS CH
1.0000 | ORAL_TABLET | Freq: Every day | ORAL | Status: DC
  Administered 2019-09-09 – 2019-09-12 (×4): 1 via ORAL
  Filled 2019-09-09 (×3): qty 1

## 2019-09-09 MED ORDER — GABAPENTIN 300 MG PO CAPS
600.0000 mg | ORAL_CAPSULE | Freq: Two times a day (BID) | ORAL | Status: DC
  Administered 2019-09-09 – 2019-09-12 (×6): 600 mg via ORAL
  Filled 2019-09-09 (×6): qty 2

## 2019-09-09 MED ORDER — POTASSIUM CHLORIDE CRYS ER 20 MEQ PO TBCR
40.0000 meq | EXTENDED_RELEASE_TABLET | Freq: Once | ORAL | Status: AC
  Administered 2019-09-09: 40 meq via ORAL
  Filled 2019-09-09: qty 2

## 2019-09-09 MED ORDER — PROSOURCE TF PO LIQD
45.0000 mL | Freq: Two times a day (BID) | ORAL | Status: DC
  Administered 2019-09-10: 45 mL
  Filled 2019-09-09 (×8): qty 45

## 2019-09-09 NOTE — Plan of Care (Signed)
  Problem: Coping: Goal: Level of anxiety will decrease Outcome: Progressing   Problem: Pain Managment: Goal: General experience of comfort will improve Outcome: Progressing   Problem: Skin Integrity: Goal: Risk for impaired skin integrity will decrease Outcome: Progressing   

## 2019-09-09 NOTE — Progress Notes (Signed)
24 staples removed from right shoulder incision as ordered.

## 2019-09-09 NOTE — Consult Note (Signed)
Reason for Consult:FUO Referring Physician: V Jesslyn Aguirre is an 74 y.o. female.  HPI: Samantha Aguirre was admitted 2d ago with FUO and confusion. She was found to have an enterobacter bacteremia. Complicating matter is that she is s/p reverse shoulder done on 8/12 at [facility]. X-rays, CT, and MRI were done and did not show any signs of septic joint or fluid collection but orthopedic surgery was consulted as they had no other source of infection and wanted to be sure. She has improved since admission in terms of her mental status but is still a bit limited.  Past Medical History:  Diagnosis Date   Acid reflux    Arthritis    Bilateral knees, hands, feet   Asthma    Atrial fibrillation (HCC)    Cancer (HCC)    CHF (congestive heart failure) (HCC)    Diastolic CHF   COPD (chronic obstructive pulmonary disease) (HCC)    Coronary artery disease    Diabetes mellitus without complication (HCC)    Frequent headaches    GI bleed    HLD (hyperlipidemia)    Hypertension    Seizures ([place])    Skin cancer 2015   Suspected basal cell carcinoma on nose, surgically removed   Stroke ([place]) 04/2017   Thyroid disease    Tremors of nervous system     Past Surgical History:  Procedure Laterality Date   Anchor     cardiac stents     CAROTID ENDARTERECTOMY     CHOLECYSTECTOMY  1980   COLONOSCOPY N/A 02/20/2017   Procedure: COLONOSCOPY;  Surgeon: Lin Landsman, MD;  Location: [facility] ENDOSCOPY;  Service: Gastroenterology;  Laterality: N/A;   ESOPHAGOGASTRODUODENOSCOPY N/A 02/20/2017   Procedure: ESOPHAGOGASTRODUODENOSCOPY (EGD);  Surgeon: Lin Landsman, MD;  Location: [facility] ENDOSCOPY;  Service: Gastroenterology;  Laterality: N/A;   ESOPHAGOGASTRODUODENOSCOPY (EGD) WITH PROPOFOL N/A 07/08/2017   Procedure: ESOPHAGOGASTRODUODENOSCOPY (EGD) WITH PROPOFOL;  Surgeon: [name], MD;  Location[facility] ENDOSCOPY;   Service: Endoscopy;  Laterality: N/A;   GALLBLADDER SURGERY  1980   GIVENS CAPSULE STUDY N/A 07/09/2017   Procedure: GIVENS CAPSULE STUDY;  Surgeon: Jonathon Bellows, MD;  Location: [facility] ENDOSCOPY;  Service: Gastroenterology;  Laterality: N/A;   KNEE SURGERY     left   KYPHOPLASTY N/A 03/16/2018   Procedure: KYPHOPLASTY, L5;  Surgeon: Hessie Knows, MD;  Location: [facility] ORS;  Service: Orthopedics;  Laterality: N/A;   REVERSE SHOULDER ARTHROPLASTY Right 08/25/2019   Procedure: REVERSE SHOULDER ARTHROPLASTY;  Surgeon: Corky Mull, MD;  Location: [facility] ORS;  Service: Orthopedics;  Laterality: Right;   ROTATOR CUFF REPAIR     right   TONSILLECTOMY  1950    Family History  Problem Relation Age of Onset   Breast cancer Mother    Heart disease Mother    Stroke Mother    Cancer Mother    COPD Mother    Diabetes Mother    Heart disease Father    Diabetes Father    Stroke Father    Alcohol abuse Sister    Drug abuse Sister    Stroke Sister    Cancer Sister    Mental illness Sister    Heart disease Brother    Arthritis Brother    Diabetes Brother    Heart disease Maternal Grandfather    Heart disease Paternal Grandfather     Social History:  reports that she has been smoking cigarettes. She has a  53.00 pack-year smoking history. She has never used smokeless tobacco. She reports that she does not drink alcohol and does not use drugs.  Allergies:  Allergies  Allergen Reactions   Ivp Dye [Iodinated Diagnostic Agents] Itching    Pt injected with IV contrast.  5 min after injection pt complained of itching behind ear and on her abd.   Methotrexate Rash   Morphine And Related Other (See Comments)    "stops my breathing" NEAR-RESPIRATORY ARREST   Mushroom Extract Complex Anaphylaxis    Made patient "deathly sick"   Atenolol Other (See Comments)    "MD took me off of it bc it was doing something wrong" Made patient HYPOTENSIVE   Percocet  [Oxycodone-Acetaminophen] Nausea And Vomiting and Other (See Comments)    Stomach pains   Percodan [Oxycodone-Aspirin] Nausea And Vomiting and Other (See Comments)    Stomach pains   Tramadol Nausea Only   Verapamil Other (See Comments)    " MD told me this was screwing me up" MAKES PATIENT HYPOTENSIVE   Oxycodone Hcl     Medications: I have reviewed the patient's current medications.  Results for orders placed or performed during the hospital encounter of 09/06/19 (from the past 48 hour(s))  Glucose, capillary     Status: Abnormal   Collection Time: 09/07/19  4:35 PM  Result Value Ref Range   Glucose-Capillary 121 (H) 70 - 99 mg/dL    Comment: Glucose reference range applies only to samples taken after fasting for at least 8 hours.   Comment 1 Notify RN    Comment 2 Document in Chart   Glucose, capillary     Status: Abnormal   Collection Time: 09/07/19  7:50 PM  Result Value Ref Range   Glucose-Capillary 119 (H) 70 - 99 mg/dL    Comment: Glucose reference range applies only to samples taken after fasting for at least 8 hours.   Comment 1 Notify RN    Comment 2 Document in Chart   Glucose, capillary     Status: Abnormal   Collection Time: 09/08/19 12:40 AM  Result Value Ref Range   Glucose-Capillary 150 (H) 70 - 99 mg/dL    Comment: Glucose reference range applies only to samples taken after fasting for at least 8 hours.   Comment 1 Notify RN    Comment 2 Document in Chart   Glucose, capillary     Status: Abnormal   Collection Time: 09/08/19  4:14 AM  Result Value Ref Range   Glucose-Capillary 135 (H) 70 - 99 mg/dL    Comment: Glucose reference range applies only to samples taken after fasting for at least 8 hours.   Comment 1 Notify RN    Comment 2 Document in Chart   Glucose, capillary     Status: Abnormal   Collection Time: 09/08/19  7:37 AM  Result Value Ref Range   Glucose-Capillary 131 (H) 70 - 99 mg/dL    Comment: Glucose reference range applies only to  samples taken after fasting for at least 8 hours.  Basic metabolic panel     Status: Abnormal   Collection Time: 09/08/19  9:56 AM  Result Value Ref Range   Sodium 136 135 - 145 mmol/L   Potassium 2.9 (L) 3.5 - 5.1 mmol/L   Chloride 106 98 - 111 mmol/L   CO2 19 (L) 22 - 32 mmol/L   Glucose, Bld 116 (H) 70 - 99 mg/dL    Comment: Glucose reference range applies only to samples taken  after fasting for at least 8 hours.   BUN 15 8 - 23 mg/dL   Creatinine, Ser 0.62 0.44 - 1.00 mg/dL   Calcium 8.3 (L) 8.9 - 10.3 mg/dL   GFR calc non Af Amer >60 >60 mL/min   GFR calc Af Amer >60 >60 mL/min   Anion gap 11 5 - 15    Comment: Performed at [facility] [address], [place], [place] [address]  Glucose, capillary     Status: Abnormal   Collection Time: 09/08/19 11:51 AM  Result Value Ref Range   Glucose-Capillary 112 (H) 70 - 99 mg/dL    Comment: Glucose reference range applies only to samples taken after fasting for at least 8 hours.  CBC with Differential/Platelet     Status: Abnormal   Collection Time: 09/08/19  1:56 PM  Result Value Ref Range   WBC 4.6 4.0 - 10.5 K/uL   RBC 3.54 (L) 3.87 - 5.11 MIL/uL   Hemoglobin 10.5 (L) 12.0 - 15.0 g/dL   HCT 31.9 (L) 36 - 46 %   MCV 90.1 80.0 - 100.0 fL   MCH 29.7 26.0 - 34.0 pg   MCHC 32.9 30.0 - 36.0 g/dL   RDW 14.3 11.5 - 15.5 %   Platelets 202 150 - 400 K/uL   nRBC 0.0 0.0 - 0.2 %   Neutrophils Relative % 86 %   Neutro Abs 4.0 1.7 - 7.7 K/uL   Lymphocytes Relative 5 %   Lymphs Abs 0.2 (L) 0.7 - 4.0 K/uL   Monocytes Relative 7 %   Monocytes Absolute 0.3 0 - 1 K/uL   Eosinophils Relative 1 %   Eosinophils Absolute 0.0 0 - 0 K/uL   Basophils Relative 0 %   Basophils Absolute 0.0 0 - 0 K/uL   Immature Granulocytes 1 %   Abs Immature Granulocytes 0.03 0.00 - 0.07 K/uL    Comment: Performed at [facility], [address], [place], [place] [address]  Magnesium     Status: None   Collection Time: 09/08/19  1:56 PM  Result  Value Ref Range   Magnesium 1.9 1.7 - 2.4 mg/dL    Comment: Performed at [facility] [address] [place], [place] [address]  Glucose, capillary     Status: Abnormal   Collection Time: 09/08/19  4:31 PM  Result Value Ref Range   Glucose-Capillary 221 (H) 70 - 99 mg/dL    Comment: Glucose reference range applies only to samples taken after fasting for at least 8 hours.  Glucose, capillary     Status: Abnormal   Collection Time: 09/08/19  7:29 PM  Result Value Ref Range   Glucose-Capillary 166 (H) 70 - 99 mg/dL    Comment: Glucose reference range applies only to samples taken after fasting for at least 8 hours.   Comment 1 Notify RN    Comment 2 Document in Chart   Glucose, capillary     Status: Abnormal   Collection Time: 09/08/19 11:24 PM  Result Value Ref Range   Glucose-Capillary 138 (H) 70 - 99 mg/dL    Comment: Glucose reference range applies only to samples taken after fasting for at least 8 hours.   Comment 1 Notify RN    Comment 2 Document in Chart   Glucose, capillary     Status: Abnormal   Collection Time: 09/09/19  3:07 AM  Result Value Ref Range   Glucose-Capillary 137 (H) 70 - 99 mg/dL  Comment: Glucose reference range applies only to samples taken after fasting for at least 8 hours.   Comment 1 Notify RN    Comment 2 Document in Chart   CBC with Differential/Platelet     Status: Abnormal   Collection Time: 09/09/19  5:02 AM  Result Value Ref Range   WBC 3.9 (L) 4.0 - 10.5 K/uL   RBC 3.12 (L) 3.87 - 5.11 MIL/uL   Hemoglobin 8.9 (L) 12.0 - 15.0 g/dL   HCT 27.7 (L) 36 - 46 %   MCV 88.8 80.0 - 100.0 fL   MCH 28.5 26.0 - 34.0 pg   MCHC 32.1 30.0 - 36.0 g/dL   RDW 14.5 11.5 - 15.5 %   Platelets 183 150 - 400 K/uL   nRBC 0.0 0.0 - 0.2 %   Neutrophils Relative % 78 %   Neutro Abs 3.0 1.7 - 7.7 K/uL   Lymphocytes Relative 7 %   Lymphs Abs 0.3 (L) 0.7 - 4.0 K/uL   Monocytes Relative 12 %   Monocytes Absolute 0.5 0 - 1 K/uL   Eosinophils Relative 2 %    Eosinophils Absolute 0.1 0 - 0 K/uL   Basophils Relative 0 %   Basophils Absolute 0.0 0 - 0 K/uL   Immature Granulocytes 1 %   Abs Immature Granulocytes 0.03 0.00 - 0.07 K/uL    Comment: Performed at [facility], [address] [place], [place] [address]  Magnesium     Status: None   Collection Time: 09/09/19  5:02 AM  Result Value Ref Range   Magnesium 2.0 1.7 - 2.4 mg/dL    Comment: Performed at [facility] [address] [place],  [address]  Comprehensive metabolic panel     Status: Abnormal   Collection Time: 09/09/19  5:02 AM  Result Value Ref Range   Sodium 136 135 - 145 mmol/L   Potassium 3.3 (L) 3.5 - 5.1 mmol/L   Chloride 108 98 - 111 mmol/L   CO2 19 (L) 22 - 32 mmol/L   Glucose, Bld 146 (H) 70 - 99 mg/dL    Comment: Glucose reference range applies only to samples taken after fasting for at least 8 hours.   BUN 15 8 - 23 mg/dL   Creatinine, Ser 0.50 0.44 - 1.00 mg/dL   Calcium 8.0 (L) 8.9 - 10.3 mg/dL   Total Protein 5.2 (L) 6.5 - 8.1 g/dL   Albumin 1.9 (L) 3.5 - 5.0 g/dL   AST 31 15 - 41 U/L   ALT 31 0 - 44 U/L   Alkaline Phosphatase 127 (H) 38 - 126 U/L   Total Bilirubin 0.4 0.3 - 1.2 mg/dL   GFR calc non Af Amer >60 >60 mL/min   GFR calc Af Amer >60 >60 mL/min   Anion gap 9 5 - 15    Comment: Performed at [facility] [address] [place], [place] [address]  Glucose, capillary     Status: Abnormal   Collection Time: 09/09/19  7:49 AM  Result Value Ref Range   Glucose-Capillary 125 (H) 70 - 99 mg/dL    Comment: Glucose reference range applies only to samples taken after fasting for at least 8 hours.  Glucose, capillary     Status: Abnormal   Collection Time: 09/09/19 11:17 AM  Result Value Ref Range   Glucose-Capillary 116 (H) 70 - 99 mg/dL    Comment: Glucose reference range applies only to samples taken after fasting for  at least 8 hours.    MR SHOULDER RIGHT WO CONTRAST  Result Date: 09/09/2019 CLINICAL DATA:  Right  shoulder pain, clinical suspicion for septic arthritis. The patient has a reverse shoulder arthroplasty. EXAM: MRI OF THE RIGHT SHOULDER WITHOUT CONTRAST TECHNIQUE: Multiplanar, multisequence MR imaging of the shoulder was performed. No intravenous contrast was administered. COMPARISON:  CT right shoulder 09/06/2019 FINDINGS: The patient was not alert or oriented during imaging and was unable to cooperate with breath holding, resulting and motion artifact. Moreover, there is a reverse shoulder arthroplasty which introduces extensive metal artifact in the region of interest. Rotator cuff:  Obscured by metal artifact. Muscles: Atrophic supraspinatus which might indirectly imply chronic supraspinatus tendon rupture. Low-grade infiltrative edema in the subscapularis, infraspinatus, and teres minor muscles. Low-grade infiltrative edema tracking within along the deltoid muscle, especially along the superficial fascia margin. Edema tracks along the fascial margins of the teres major muscle. Biceps long head:  Completely obscured by metal artifact. Acromioclavicular Joint: Suspected prior acromioplasty. Questionable prior Mumford procedure. Type II acromion. No well-defined bursitis. Glenohumeral Joint: Completely obscured by metal artifact. No large fluid collection in the joint extending beyond the margins of the metal artifact. Labrum:  N/A Bones: Accentuated edema signal in the humeral shaft just distal to the stem of the humeral component of the prosthesis, for example on image 5/22 and image 29/15. Given that the arthroplasty was only about 2 weeks ago, this degree of residual edema may not be necessarily considered abnormal, but clearly early osteomyelitis is difficult to exclude. I do not observe definite indicators of unusual bony demineralization or fracture in this vicinity on the recent CT from 09/06/2019. Other: No supplemental non-categorized findings. IMPRESSION: 1. Accentuated edema signal in the humeral  shaft just distal to the stem of the humeral component of the prosthesis. Given that the arthroplasty was only about 2 weeks ago, this degree of residual edema is not necessarily considered abnormal, but clearly early osteomyelitis is difficult to exclude. 2. Low-grade infiltrative edema in the subscapularis, infraspinatus, and teres minor muscles. 3. Low-grade infiltrative edema tracking within along the superficial fascia margins of the deltoid muscle and along the fascial margins of the teres major muscle. 4. Suspected prior acromioplasty. 5. No obvious effusion extending beyond the boundaries of the metal artifact, and no definite drainable fluid collection. Regional ultrasound may be helpful in assessing for fluid pockets of might be otherwise obscured by the metal artifact. 6. Markedly atrophic supraspinatus muscle may be an indicator of chronic supraspinatus tear. Electronically Signed   By: Van Clines M.D.   On: 09/09/2019 09:10   ECHOCARDIOGRAM COMPLETE  Result Date: 09/07/2019    ECHOCARDIOGRAM REPORT   Patient Name:   Samantha Aguirre Date of Exam: 09/07/2019 Medical Rec #:  [id number]        Height:       61.0 in Accession #:    [id number]       Weight:       168.0 lb Date of Birth:  12-08-1945        BSA:          1.754 m Patient Age:    19 years         BP:           118/61 mmHg Patient Gender: F                HR:           69 bpm. Exam Location:  Inpatient Procedure:  2D Echo, Cardiac Doppler, Color Doppler and Intracardiac            Opacification Agent Indications:    Bacteremia  History:        Patient has prior history of Echocardiogram examinations, most                 recent 03/14/2018. CHF, CAD, COPD, Arrythmias:Atrial Fibrillation;                 Risk Factors:Diabetes and Current Smoker.  Sonographer:    Clayton Lefort RDCS (AE) Referring Phys: Eunice Blase [facility]  Sonographer Comments: Technically challenging study due to limited acoustic windows, Technically difficult study due to poor  echo windows, suboptimal parasternal window, suboptimal apical window and patient is morbidly obese. Suboptimal image quality for  evaluation of valves in multiple views. IMPRESSIONS  1. Left ventricular ejection fraction, by estimation, is 55 to 60%. The left ventricle has normal function. The left ventricle has no regional wall motion abnormalities. Left ventricular diastolic parameters were normal.  2. Right ventricular systolic function is normal. The right ventricular size is mildly enlarged. Tricuspid regurgitation signal is inadequate for assessing PA pressure.  3. The mitral valve is grossly normal. No evidence of mitral valve regurgitation. No evidence of mitral stenosis.  4. The aortic valve is grossly normal. Aortic valve regurgitation is not visualized. Mild aortic valve sclerosis is present, with no evidence of aortic valve stenosis.  5. The inferior vena cava is dilated in size with >50% respiratory variability, suggesting right atrial pressure of 8 mmHg. Comparison(s): No significant change from prior study. Conclusion(s)/Recommendation(s): No evidence of valvular vegetations on this transthoracic echocardiogram. Would recommend a transesophageal echocardiogram to exclude infective endocarditis if clinically indicated. FINDINGS  Left Ventricle: Left ventricular ejection fraction, by estimation, is 55 to 60%. The left ventricle has normal function. The left ventricle has no regional wall motion abnormalities. The left ventricular internal cavity size was normal in size. There is  no left ventricular hypertrophy. Left ventricular diastolic parameters were normal. Right Ventricle: The right ventricular size is mildly enlarged. No increase in right ventricular wall thickness. Right ventricular systolic function is normal. Tricuspid regurgitation signal is inadequate for assessing PA pressure. Left Atrium: Left atrial size was normal in size. Right Atrium: Right atrial size was normal in size. Pericardium:  There is no evidence of pericardial effusion. Presence of pericardial fat pad. Mitral Valve: The mitral valve is grossly normal. No evidence of mitral valve regurgitation. No evidence of mitral valve stenosis. Tricuspid Valve: The tricuspid valve is grossly normal. Tricuspid valve regurgitation is trivial. No evidence of tricuspid stenosis. Aortic Valve: The aortic valve is grossly normal. Aortic valve regurgitation is not visualized. Mild aortic valve sclerosis is present, with no evidence of aortic valve stenosis. Aortic valve mean gradient measures 4.5 mmHg. Aortic valve peak gradient measures 8.6 mmHg. Aortic valve area, by VTI measures 2.47 cm. Pulmonic Valve: The pulmonic valve was grossly normal. Pulmonic valve regurgitation is not visualized. No evidence of pulmonic stenosis. Aorta: The aortic root and ascending aorta are structurally normal, with no evidence of dilitation. Venous: The inferior vena cava is dilated in size with greater than 50% respiratory variability, suggesting right atrial pressure of 8 mmHg. IAS/Shunts: The atrial septum is grossly normal. EKG: Rhythm strip during this exam demostrated normal sinus rhythm and premature atrial contractions.  LEFT VENTRICLE PLAX 2D LVIDd:         5.00 cm  Diastology LVIDs:  3.70 cm  LV e' lateral:   9.79 cm/s LV PW:         1.10 cm  LV E/e' lateral: 9.9 LV IVS:        1.00 cm  LV e' medial:    8.49 cm/s LVOT diam:     2.00 cm  LV E/e' medial:  11.4 LV SV:         71 LV SV Index:   40 LVOT Area:     3.14 cm  RIGHT VENTRICLE             IVC RV Basal diam:  3.80 cm     IVC diam: 2.40 cm RV Mid diam:    2.80 cm RV S prime:     16.40 cm/s TAPSE (M-mode): 2.5 cm LEFT ATRIUM             Index       RIGHT ATRIUM           Index LA diam:        2.40 cm 1.37 cm/m  RA Area:     15.70 cm LA Vol (A2C):   35.3 ml 20.13 ml/m RA Volume:   41.20 ml  23.49 ml/m LA Vol (A4C):   47.0 ml 26.80 ml/m LA Biplane Vol: 43.8 ml 24.97 ml/m  AORTIC VALVE AV Area (Vmax):     2.82 cm AV Area (Vmean):   2.48 cm AV Area (VTI):     2.47 cm AV Vmax:           147.00 cm/s AV Vmean:          95.200 cm/s AV VTI:            0.287 m AV Peak Grad:      8.6 mmHg AV Mean Grad:      4.5 mmHg LVOT Vmax:         131.75 cm/s LVOT Vmean:        75.250 cm/s LVOT VTI:          0.226 m LVOT/AV VTI ratio: 0.79  AORTA Ao Root diam: 3.00 cm Ao Asc diam:  2.80 cm MV E velocity: 96.74 cm/s MV A velocity: 96.74 cm/s  SHUNTS MV E/A ratio:  1.00        Systemic VTI:  0.23 m                            Systemic Diam: 2.00 cm Eleonore Chiquito MD Electronically signed by Eleonore Chiquito MD Signature Date/Time: 09/07/2019/5:06:51 PM    Final     Review of Systems  Unable to perform ROS: Mental status change  Musculoskeletal: Positive for arthralgias (Right shoulder).   Blood pressure 117/71, pulse 64, temperature 98.9 F (37.2 C), temperature source Oral, resp. rate 20, height 5\' 1"  (1.549 m), weight 76.2 kg, SpO2 99 %. Physical Exam Constitutional:      General: She is not in acute distress.    Appearance: She is well-developed. She is not diaphoretic.  HENT:     Head: Normocephalic and atraumatic.  Eyes:     General: No scleral icterus.       Right eye: No discharge.        Left eye: No discharge.     Conjunctiva/sclera: Conjunctivae normal.  Cardiovascular:     Rate and Rhythm: Normal rate and regular rhythm.  Pulmonary:     Effort: Pulmonary effort is normal. No respiratory distress.  Musculoskeletal:     Cervical back: Normal range of motion.     Comments: Right shoulder, elbow, wrist, digits- Incision C/D/I, staples in place, mild diffuse TTP, minimal AROM but nearly 90 degrees painless flexion/abduction PROM, no instability, no blocks to motion  Sens  Ax/R/M/U intact  Mot   Ax/ R/ PIN/ M/ AIN/ U intact  Rad 2+  Skin:    General: Skin is warm and dry.  Neurological:     Mental Status: She is alert.  Psychiatric:        Behavior: Behavior normal.     Assessment/Plan: S/p  right reverse shoulder arthroplasty -- No s/sx of surgical site infection or septic joint. I do not believe the shoulder is the source of her infectious issues. Will d/c staples.    Lisette Abu, PA-C Orthopedic Surgery [phone] 09/09/2019, 2:45 PM

## 2019-09-09 NOTE — Progress Notes (Signed)
PROGRESS NOTE    Samantha Aguirre  ENI:[id number] DOB: 01/03/1946 DOA: 09/06/2019 PCP: Tracie Harrier, MD    Chief Complaint  Patient presents with  . Altered Mental Status  . Fall    Brief Narrative: 74 year old lady prior history of paroxysmal atrial fibrillation, chronic diastolic heart failure, COPD, asthma, noninsulin-dependent type 2 diabetes mellitus, essential hypertension, hyperlipidemia, seizure disorder, history of CVA in the past, hypothyroidism, coronary artery disease, GERD, recent right shoulder arthroplasty on 08/25/2019 presents to ED from [facility] for evaluation of fever and altered mental status.  History of unwitnessed fall on Saturday.  Patient is confused and history was not available on admission.  On arrival to ED she was febrile tachypneic, had leukocytosis.  Blood cultures grew Klebsiella aerogenes in all the bottles. Chest x-ray does not show any pneumonia urine analysis is negative, CT of the head is negative for acute intracranial abnormality.  CT of the abdomen pelvis does not show any acute infectious source.  X-rays of the knee negative for acute fracture or malalignment. She was initially started on IV vancomycin, ampicillin and Rocephin for evaluation of meningitis, later on transitioned to IV cefepime for Klebsiella bacteremia.  Patient seen and examined at bedside she is alert oriented to person and place this morning.  Was able to answer simple questions and following commands.  PT evaluation recommending SNF on discharge.  Meanwhile ID consulted for evaluation of Enterobacter bacteremia.  Echocardiogram ordered for evaluation of heart valves.  Ultrasound of the right shoulder ordered to see if she has any signs of septic joint. Pt seen and examined at bedside, febrile last night and repeat cultures ordered this morning.  PT evaluation ordered and recommended SNF. toc on board.    Assessment & Plan:   Principal Problem:   AMS (altered mental  status) Active Problems:   Acute on chronic anemia   Fever   SIRS (systemic inflammatory response syndrome) (HCC)   Hypokalemia   Bacteremia   Sepsis with encephalopathy without septic shock ([place])  Sepsis with Enterobacter aerogenes bacteremia.  Patient was empirically started on IV vancomycin, Rocephin and ampicillin later on transitioned to IV cefepime.   ID consulted for recommendations. Unclear source as her urine analysis is negative for infection.  CT of the abdomen and pelvis does not show any acute intra-abdominal pathology. MRI of the shoulder shows Accentuated edema signal in the humeral shaft just distal to the stem of the humeral component of the prosthesis. Given that the arthroplasty was only about 2 weeks ago, this degree of residual edema is not necessarily considered abnormal, but clearly early osteomyelitis is difficult to exclude.     Acute metabolic encephalopathy Probably secondary to sepsis vs from being off keppra. She was also found to have elevated ammonia levels with mildly elevated liver enzymes and elevated alkaline phosphatase.  Repeat liver enzymes wnl.  Patient is currently alert and oriented to place and person and answering simple questions and following some commands.   Resolved.    Seizures :  Pt has been off keppra for the last two weeks of unclear etiology.  Restarted the keppra and topamax.   Anemia of chronic disease and anemia of blood loss probably from right shoulder arthroplasty. Baseline hemoglobin appears to be around 12 dropped to 11 post surgery and currently at 8.9.  L0W iron levels and low folate levels on anemia panel.  Replace iron and folate.   Hypokalemia Replaced, and repeat level wnl.    Essential hypertension:  Well controlled.  Type 2 DM:  CBG (last 3)  Recent Labs    09/08/19 2324 09/09/19 0307 09/09/19 0749  GLUCAP 138* 137* 125*   Resume sliding scale insulin. Hemoglobin A1c is 6.3%    Copd:  No  wheezing heard.     Chronic diastolic heart failure Last echocardiogram from October 2020 shows left ventricular ejection fraction of 55 to 60% with normal function.  She appears compensated.  Lasix on hold for now.     Hypothyroidism Continue with Synthroid 125 MCG daily.   Hyperlipidemia Continue with Lipitor 80 mg daily.    History of coronary artery disease, CVA in the past. LP cancelled. Resume aspirin and plavix.    Mild metabolic acidosis, non-anion gap Continue to monitor. Bicarb still at 19.    Nutrition:  SLP evalution recommending dysphagia 1 diet / Pureed diet with thin liquid.  Liquids provided via: Cup;Straw Medication Administration: Crushed with puree Compensations: Minimize environmental distractions;Slow rate   DVT prophylaxis: SCDs Code Status: DNR Family Communication: None at bedside discussed with son over the phone.   Disposition:   Status is: Inpatient  Remains inpatient appropriate because:Unsafe d/c plan and IV treatments appropriate due to intensity of illness or inability to take PO   Dispo: The patient is from: Home              Anticipated d/c is to: Pending              Anticipated d/c date is: > 3 days              Patient currently is not medically stable to d/c.       Consultants:   Infectious disease  Procedures: None Antimicrobials: ( Anti-infectives (From admission, onward)   Start     Dose/Rate Route Frequency Ordered Stop   09/07/19 1200  ceFEPIme (MAXIPIME) 2 g in sodium chloride 0.9 % 100 mL IVPB        2 g 200 mL/hr over 30 Minutes Intravenous Every 8 hours 09/07/19 1058     09/07/19 0800  vancomycin (VANCOREADY) IVPB 750 mg/150 mL  Status:  Discontinued        750 mg 150 mL/hr over 60 Minutes Intravenous Every 12 hours 09/06/19 1930 09/07/19 1100   09/07/19 0600  cefTRIAXone (ROCEPHIN) 2 g in sodium chloride 0.9 % 100 mL IVPB  Status:  Discontinued        2 g 200 mL/hr over 30 Minutes Intravenous Every 12  hours 09/06/19 2018 09/07/19 1045   09/06/19 2030  cefTRIAXone (ROCEPHIN) 1 g in sodium chloride 0.9 % 100 mL IVPB        1 g 200 mL/hr over 30 Minutes Intravenous  Once 09/06/19 2018 09/07/19 0132   09/06/19 2030  ampicillin (OMNIPEN) 2 g in sodium chloride 0.9 % 100 mL IVPB  Status:  Discontinued        2 g 300 mL/hr over 20 Minutes Intravenous Every 4 hours 09/06/19 2018 09/07/19 1100   09/06/19 2000  vancomycin (VANCOREADY) IVPB 1750 mg/350 mL        1,750 mg 175 mL/hr over 120 Minutes Intravenous  Once 09/06/19 1930 09/06/19 2238   09/06/19 1600  cefTRIAXone (ROCEPHIN) 1 g in sodium chloride 0.9 % 100 mL IVPB  Status:  Discontinued        1 g 200 mL/hr over 30 Minutes Intravenous Every 24 hours 09/06/19 1549 09/06/19 2018       Subjective: No chest pain or sob.  Objective: Vitals:   09/09/19 0020 09/09/19 0220 09/09/19 0309 09/09/19 0748  BP: 116/60 128/62 121/63 99/66  Pulse: 71  82 64  Resp: (!) 26  (!) 27 (!) 25  Temp: 98.4 F (36.9 C) 98.6 F (37 C) 99.3 F (37.4 C) 97.7 F (36.5 C)  TempSrc: Oral Oral Oral Oral  SpO2: 97%  98% 98%  Weight:      Height:        Intake/Output Summary (Last 24 hours) at 09/09/2019 1050 Last data filed at 09/09/2019 0500 Gross per 24 hour  Intake 800 ml  Output --  Net 800 ml   Filed Weights   09/06/19 1523  Weight: 76.2 kg    Examination:  General exam: Uncomfortable, not in distress Respiratory system: Diminished air entry at bases, no wheezing or rhonchi Cardiovascular system: S1-S2 heard, regular rate rhythm, no JVD. Gastrointestinal system: Abdomen is soft, nontender, nondistended, bowel sounds within normal limits. Central nervous system: Patient is alert oriented to place and person, grossly nonfocal Extremities: Right shoulder in sling, right shoulder joint bandaged Skin: No rashes seen Psychiatry: Mood is appropriate  Data Reviewed: I have personally reviewed following labs and imaging studies  CBC: Recent  Labs  Lab 09/06/19 1617 09/08/19 1356 09/09/19 0502  WBC 13.0* 4.6 3.9*  NEUTROABS 11.8* 4.0 3.0  HGB 9.9* 10.5* 8.9*  HCT 31.5* 31.9* 27.7*  MCV 92.6 90.1 88.8  PLT 283 202 696    Basic Metabolic Panel: Recent Labs  Lab 09/06/19 1617 09/06/19 2035 09/07/19 0557 09/08/19 0956 09/08/19 1356 09/09/19 0502  NA 137  --  138 136  --  136  K 3.2*  --  3.4* 2.9*  --  3.3*  CL 105  --  107 106  --  108  CO2 21*  --  19* 19*  --  19*  GLUCOSE 172*  --  134* 116*  --  146*  BUN 14  --  14 15  --  15  CREATININE 0.76  --  0.60 0.62  --  0.50  CALCIUM 8.1*  --  8.2* 8.3*  --  8.0*  MG  --  1.7  --   --  1.9 2.0    GFR: Estimated Creatinine Clearance: 56.8 mL/min (by C-G formula based on SCr of 0.5 mg/dL).  Liver Function Tests: Recent Labs  Lab 09/06/19 1617 09/07/19 0557 09/09/19 0502  AST 51* 49* 31  ALT 46* 44 31  ALKPHOS 167* 148* 127*  BILITOT 1.4* 0.7 0.4  PROT 5.8* 5.2* 5.2*  ALBUMIN 2.6* 2.3* 1.9*    CBG: Recent Labs  Lab 09/08/19 1631 09/08/19 1929 09/08/19 2324 09/09/19 0307 09/09/19 0749  GLUCAP 221* 166* 138* 137* 125*     Recent Results (from the past 240 hour(s))  Blood Culture (routine x 2)     Status: Abnormal   Collection Time: 09/06/19  4:17 PM   Specimen: BLOOD LEFT FOREARM  Result Value Ref Range Status   Specimen Description BLOOD LEFT FOREARM  Final   Special Requests   Final    BOTTLES DRAWN AEROBIC AND ANAEROBIC Blood Culture adequate volume   Culture  Setup Time   Final    GRAM NEGATIVE RODS IN BOTH AEROBIC AND ANAEROBIC BOTTLES Organism ID to follow CRITICAL RESULT CALLED TO, READ BACK BY AND VERIFIED WITH: Molly Maduro PharmD 9:10 09/07/19 ([email or web address]) Performed at [facility], [place] [address] [place], [place] [address]    Culture ENTEROBACTER AEROGENES (A)  Final  Report Status 09/09/2019 FINAL  Final   Organism ID, Bacteria ENTEROBACTER AEROGENES  Final      Susceptibility   Enterobacter aerogenes - MIC*    CEFAZOLIN  >=64 RESISTANT Resistant     CEFEPIME <=0.12 SENSITIVE Sensitive     CEFTAZIDIME <=1 SENSITIVE Sensitive     CEFTRIAXONE <=0.25 SENSITIVE Sensitive     CIPROFLOXACIN <=0.25 SENSITIVE Sensitive     GENTAMICIN <=1 SENSITIVE Sensitive     IMIPENEM 2 SENSITIVE Sensitive     TRIMETH/SULFA <=20 SENSITIVE Sensitive     PIP/TAZO <=4 SENSITIVE Sensitive     * ENTEROBACTER AEROGENES  Blood Culture ID Panel (Reflexed)     Status: Abnormal   Collection Time: 09/06/19  4:17 PM  Result Value Ref Range Status   Enterococcus faecalis NOT DETECTED NOT DETECTED Final   Enterococcus Faecium NOT DETECTED NOT DETECTED Final   Listeria monocytogenes NOT DETECTED NOT DETECTED Final   Staphylococcus species NOT DETECTED NOT DETECTED Final   Staphylococcus aureus (BCID) NOT DETECTED NOT DETECTED Final   Staphylococcus epidermidis NOT DETECTED NOT DETECTED Final   Staphylococcus lugdunensis NOT DETECTED NOT DETECTED Final   Streptococcus species NOT DETECTED NOT DETECTED Final   Streptococcus agalactiae NOT DETECTED NOT DETECTED Final   Streptococcus pneumoniae NOT DETECTED NOT DETECTED Final   Streptococcus pyogenes NOT DETECTED NOT DETECTED Final   A.calcoaceticus-baumannii NOT DETECTED NOT DETECTED Final   Bacteroides fragilis NOT DETECTED NOT DETECTED Final   Enterobacterales DETECTED (A) NOT DETECTED Final    Comment: Enterobacterales represent a large order of gram negative bacteria, not a single organism. CRITICAL RESULT CALLED TO, READ BACK BY AND VERIFIED WITH: Molly Maduro PharmD 9:10 09/07/19 ([email or web address])    Enterobacter cloacae complex NOT DETECTED NOT DETECTED Final   Escherichia coli NOT DETECTED NOT DETECTED Final   Klebsiella aerogenes DETECTED (A) NOT DETECTED Final    Comment: CRITICAL RESULT CALLED TO, READ BACK BY AND VERIFIED WITH: Molly Maduro PharmD 9:10 09/07/19 ([email or web address])    Klebsiella oxytoca NOT DETECTED NOT DETECTED Final   Klebsiella pneumoniae NOT DETECTED NOT DETECTED Final   Proteus  species NOT DETECTED NOT DETECTED Final   Salmonella species NOT DETECTED NOT DETECTED Final   Serratia marcescens NOT DETECTED NOT DETECTED Final   Haemophilus influenzae NOT DETECTED NOT DETECTED Final   Neisseria meningitidis NOT DETECTED NOT DETECTED Final   Pseudomonas aeruginosa NOT DETECTED NOT DETECTED Final   Stenotrophomonas maltophilia NOT DETECTED NOT DETECTED Final   Candida albicans NOT DETECTED NOT DETECTED Final   Candida auris NOT DETECTED NOT DETECTED Final   Candida glabrata NOT DETECTED NOT DETECTED Final   Candida krusei NOT DETECTED NOT DETECTED Final   Candida parapsilosis NOT DETECTED NOT DETECTED Final   Candida tropicalis NOT DETECTED NOT DETECTED Final   Cryptococcus neoformans/gattii NOT DETECTED NOT DETECTED Final   CTX-M ESBL NOT DETECTED NOT DETECTED Final   Carbapenem resistance IMP NOT DETECTED NOT DETECTED Final   Carbapenem resistance KPC NOT DETECTED NOT DETECTED Final   Carbapenem resistance NDM NOT DETECTED NOT DETECTED Final   Carbapenem resist OXA 48 LIKE NOT DETECTED NOT DETECTED Final   Carbapenem resistance VIM NOT DETECTED NOT DETECTED Final    Comment: Performed at [facility], [address], [place], [place] [address]  Urine culture     Status: None   Collection Time: 09/06/19  4:18 PM   Specimen: In/Out Cath Urine  Result Value Ref Range Status   Specimen Description IN/OUT CATH URINE  Final   Special Requests NONE  Final   Culture   Final    NO GROWTH Performed at [facility], [place] [address] [place], [place] [address]    Report Status 09/07/2019 FINAL  Final  SARS Coronavirus 2 by RT PCR (hospital order, performed in [facility] hospital lab) Nasopharyngeal Urine, Catheterized     Status: None   Collection Time: 09/06/19  4:18 PM   Specimen: Urine, Catheterized; Nasopharyngeal  Result Value Ref Range Status   SARS Coronavirus 2 NEGATIVE NEGATIVE Final    Comment: (NOTE) SARS-CoV-2 target nucleic acids are NOT  DETECTED.  The SARS-CoV-2 RNA is generally detectable in upper and lower respiratory specimens during the acute phase of infection. The lowest concentration of SARS-CoV-2 viral copies this assay can detect is 250 copies / mL. A negative result does not preclude SARS-CoV-2 infection and should not be used as the sole basis for treatment or other patient management decisions.  A negative result may occur with improper specimen collection / handling, submission of specimen other than nasopharyngeal swab, presence of viral mutation(s) within the areas targeted by this assay, and inadequate number of viral copies (<250 copies / mL). A negative result must be combined with clinical observations, patient history, and epidemiological information.  Fact Sheet for Patients:   StrictlyIdeas.no  Fact Sheet for Healthcare Providers: BankingDealers.co.za  This test is not yet approved or  cleared by the [place] FDA and has been authorized for detection and/or diagnosis of SARS-CoV-2 by FDA under an Emergency Use Authorization (EUA).  This EUA will remain in effect (meaning this test can be used) for the duration of the COVID-19 declaration under Section 564(b)(1) of the Act, 21 U.S.C. section 360bbb-3(b)(1), unless the authorization is terminated or revoked sooner.  Performed at [facility], [place] [address] [place], [place] [address]   Blood Culture (routine x 2)     Status: Abnormal   Collection Time: 09/06/19  4:21 PM   Specimen: BLOOD  Result Value Ref Range Status   Specimen Description BLOOD LEFT UPPER ARM  Final   Special Requests   Final    BOTTLES DRAWN AEROBIC AND ANAEROBIC Blood Culture adequate volume   Culture  Setup Time   Final    GRAM NEGATIVE RODS IN BOTH AEROBIC AND ANAEROBIC BOTTLES CRITICAL VALUE NOTED.  VALUE IS CONSISTENT WITH PREVIOUSLY REPORTED AND CALLED VALUE.    Culture (A)  Final    ENTEROBACTER  AEROGENES SUSCEPTIBILITIES PERFORMED ON PREVIOUS CULTURE WITHIN THE LAST 5 DAYS. Performed at [facility], [place] [address] [place], [place] [address]    Report Status 09/09/2019 FINAL  Final         Radiology Studies: MR SHOULDER RIGHT WO CONTRAST  Result Date: 09/09/2019 CLINICAL DATA:  Right shoulder pain, clinical suspicion for septic arthritis. The patient has a reverse shoulder arthroplasty. EXAM: MRI OF THE RIGHT SHOULDER WITHOUT CONTRAST TECHNIQUE: Multiplanar, multisequence MR imaging of the shoulder was performed. No intravenous contrast was administered. COMPARISON:  CT right shoulder 09/06/2019 FINDINGS: The patient was not alert or oriented during imaging and was unable to cooperate with breath holding, resulting and motion artifact. Moreover, there is a reverse shoulder arthroplasty which introduces extensive metal artifact in the region of interest. Rotator cuff:  Obscured by metal artifact. Muscles: Atrophic supraspinatus which might indirectly imply chronic supraspinatus tendon rupture. Low-grade infiltrative edema in the subscapularis, infraspinatus, and teres minor muscles. Low-grade infiltrative edema tracking within along the deltoid  muscle, especially along the superficial fascia margin. Edema tracks along the fascial margins of the teres major muscle. Biceps long head:  Completely obscured by metal artifact. Acromioclavicular Joint: Suspected prior acromioplasty. Questionable prior Mumford procedure. Type II acromion. No well-defined bursitis. Glenohumeral Joint: Completely obscured by metal artifact. No large fluid collection in the joint extending beyond the margins of the metal artifact. Labrum:  N/A Bones: Accentuated edema signal in the humeral shaft just distal to the stem of the humeral component of the prosthesis, for example on image 5/22 and image 29/15. Given that the arthroplasty was only about 2 weeks ago, this degree of residual edema may not be necessarily  considered abnormal, but clearly early osteomyelitis is difficult to exclude. I do not observe definite indicators of unusual bony demineralization or fracture in this vicinity on the recent CT from 09/06/2019. Other: No supplemental non-categorized findings. IMPRESSION: 1. Accentuated edema signal in the humeral shaft just distal to the stem of the humeral component of the prosthesis. Given that the arthroplasty was only about 2 weeks ago, this degree of residual edema is not necessarily considered abnormal, but clearly early osteomyelitis is difficult to exclude. 2. Low-grade infiltrative edema in the subscapularis, infraspinatus, and teres minor muscles. 3. Low-grade infiltrative edema tracking within along the superficial fascia margins of the deltoid muscle and along the fascial margins of the teres major muscle. 4. Suspected prior acromioplasty. 5. No obvious effusion extending beyond the boundaries of the metal artifact, and no definite drainable fluid collection. Regional ultrasound may be helpful in assessing for fluid pockets of might be otherwise obscured by the metal artifact. 6. Markedly atrophic supraspinatus muscle may be an indicator of chronic supraspinatus tear. Electronically Signed   By: Van Clines M.D.   On: 09/09/2019 09:10   ECHOCARDIOGRAM COMPLETE  Result Date: 09/07/2019    ECHOCARDIOGRAM REPORT   Patient Name:   Samantha Aguirre Date of Exam: 09/07/2019 Medical Rec #:  [id number]        Height:       61.0 in Accession #:    [id number]       Weight:       168.0 lb Date of Birth:  1945/05/24        BSA:          1.754 m Patient Age:    44 years         BP:           118/61 mmHg Patient Gender: F                HR:           69 bpm. Exam Location:  Inpatient Procedure: 2D Echo, Cardiac Doppler, Color Doppler and Intracardiac            Opacification Agent Indications:    Bacteremia  History:        Patient has prior history of Echocardiogram examinations, most                 recent  03/14/2018. CHF, CAD, COPD, Arrythmias:Atrial Fibrillation;                 Risk Factors:Diabetes and Current Smoker.  Sonographer:    Clayton Lefort RDCS (AE) Referring Phys: Eunice Blase [facility]  Sonographer Comments: Technically challenging study due to limited acoustic windows, Technically difficult study due to poor echo windows, suboptimal parasternal window, suboptimal apical window and patient is morbidly obese. Suboptimal image quality for  evaluation of valves  in multiple views. IMPRESSIONS  1. Left ventricular ejection fraction, by estimation, is 55 to 60%. The left ventricle has normal function. The left ventricle has no regional wall motion abnormalities. Left ventricular diastolic parameters were normal.  2. Right ventricular systolic function is normal. The right ventricular size is mildly enlarged. Tricuspid regurgitation signal is inadequate for assessing PA pressure.  3. The mitral valve is grossly normal. No evidence of mitral valve regurgitation. No evidence of mitral stenosis.  4. The aortic valve is grossly normal. Aortic valve regurgitation is not visualized. Mild aortic valve sclerosis is present, with no evidence of aortic valve stenosis.  5. The inferior vena cava is dilated in size with >50% respiratory variability, suggesting right atrial pressure of 8 mmHg. Comparison(s): No significant change from prior study. Conclusion(s)/Recommendation(s): No evidence of valvular vegetations on this transthoracic echocardiogram. Would recommend a transesophageal echocardiogram to exclude infective endocarditis if clinically indicated. FINDINGS  Left Ventricle: Left ventricular ejection fraction, by estimation, is 55 to 60%. The left ventricle has normal function. The left ventricle has no regional wall motion abnormalities. The left ventricular internal cavity size was normal in size. There is  no left ventricular hypertrophy. Left ventricular diastolic parameters were normal. Right Ventricle: The right  ventricular size is mildly enlarged. No increase in right ventricular wall thickness. Right ventricular systolic function is normal. Tricuspid regurgitation signal is inadequate for assessing PA pressure. Left Atrium: Left atrial size was normal in size. Right Atrium: Right atrial size was normal in size. Pericardium: There is no evidence of pericardial effusion. Presence of pericardial fat pad. Mitral Valve: The mitral valve is grossly normal. No evidence of mitral valve regurgitation. No evidence of mitral valve stenosis. Tricuspid Valve: The tricuspid valve is grossly normal. Tricuspid valve regurgitation is trivial. No evidence of tricuspid stenosis. Aortic Valve: The aortic valve is grossly normal. Aortic valve regurgitation is not visualized. Mild aortic valve sclerosis is present, with no evidence of aortic valve stenosis. Aortic valve mean gradient measures 4.5 mmHg. Aortic valve peak gradient measures 8.6 mmHg. Aortic valve area, by VTI measures 2.47 cm. Pulmonic Valve: The pulmonic valve was grossly normal. Pulmonic valve regurgitation is not visualized. No evidence of pulmonic stenosis. Aorta: The aortic root and ascending aorta are structurally normal, with no evidence of dilitation. Venous: The inferior vena cava is dilated in size with greater than 50% respiratory variability, suggesting right atrial pressure of 8 mmHg. IAS/Shunts: The atrial septum is grossly normal. EKG: Rhythm strip during this exam demostrated normal sinus rhythm and premature atrial contractions.  LEFT VENTRICLE PLAX 2D LVIDd:         5.00 cm  Diastology LVIDs:         3.70 cm  LV e' lateral:   9.79 cm/s LV PW:         1.10 cm  LV E/e' lateral: 9.9 LV IVS:        1.00 cm  LV e' medial:    8.49 cm/s LVOT diam:     2.00 cm  LV E/e' medial:  11.4 LV SV:         71 LV SV Index:   40 LVOT Area:     3.14 cm  RIGHT VENTRICLE             IVC RV Basal diam:  3.80 cm     IVC diam: 2.40 cm RV Mid diam:    2.80 cm RV S prime:     16.40 cm/s  TAPSE (M-mode): 2.5  cm LEFT ATRIUM             Index       RIGHT ATRIUM           Index LA diam:        2.40 cm 1.37 cm/m  RA Area:     15.70 cm LA Vol (A2C):   35.3 ml 20.13 ml/m RA Volume:   41.20 ml  23.49 ml/m LA Vol (A4C):   47.0 ml 26.80 ml/m LA Biplane Vol: 43.8 ml 24.97 ml/m  AORTIC VALVE AV Area (Vmax):    2.82 cm AV Area (Vmean):   2.48 cm AV Area (VTI):     2.47 cm AV Vmax:           147.00 cm/s AV Vmean:          95.200 cm/s AV VTI:            0.287 m AV Peak Grad:      8.6 mmHg AV Mean Grad:      4.5 mmHg LVOT Vmax:         131.75 cm/s LVOT Vmean:        75.250 cm/s LVOT VTI:          0.226 m LVOT/AV VTI ratio: 0.79  AORTA Ao Root diam: 3.00 cm Ao Asc diam:  2.80 cm MV E velocity: 96.74 cm/s MV A velocity: 96.74 cm/s  SHUNTS MV E/A ratio:  1.00        Systemic VTI:  0.23 m                            Systemic Diam: 2.00 cm Eleonore Chiquito MD Electronically signed by Eleonore Chiquito MD Signature Date/Time: 09/07/2019/5:06:51 PM    Final         Scheduled Meds: . atorvastatin  80 mg Oral q1800  . DULoxetine  60 mg Oral Daily  . ferrous sulfate  325 mg Oral BID WC  . folic acid  1 mg Oral Daily  . gabapentin  800 mg Oral BID  . insulin aspart  0-9 Units Subcutaneous Q4H  . levETIRAcetam  500 mg Oral BID  . levothyroxine  125 mcg Oral Q0600  . potassium chloride  40 mEq Oral Once  . topiramate  25 mg Oral BID   Continuous Infusions: . ceFEPime (MAXIPIME) IV 2 g (09/09/19 0323)     LOS: 3 days       Hosie Poisson, MD [facility]   To contact the attending provider between 7A-7P or the covering provider during after hours 7P-7A, please log into the web site [email or web address] and access using universal [place] password for that web site. If you do not have the password, please call the hospital operator.  09/09/2019, 10:50 AM

## 2019-09-09 NOTE — Progress Notes (Signed)
[facility] Infectious Diseases Follow Up Note  Patient Identification: Patient Name: Samantha Aguirre MRN: [id number] [place] Date: 09/06/2019  3:14 PM Age: 74 y.o.  Today's Date: 09/09/2019  Principal Problem:   AMS (altered mental status) Active Problems:   Acute on chronic anemia   Fever   SIRS (systemic inflammatory response syndrome) (HCC)   Hypokalemia   Antibiotics:  cefepime Day 3  Assessment 74 year old female with multiple comorbidities with recent reverse shoulder arthroplasty on 8/12 with Ortho who presented with fever and AMS. ID following for  1. Enterobacter aerogenes bacteremia  - Patient complained of some upset abdomen prior to coming to the hospital. I suspect the bacteremia is GI related  - MRI of rt shoulder reviewed - Accentuated edema signal in the humeral shaft just distal to the stem of the humeral component of the prosthesis. Given that the arthroplasty was only about 2 weeks ago, this degree of residual edema is not necessarily considered abnormal, but clearly early osteomyelitis is difficult to exclude. - No definite effusion or definite drainable fluid collection.  - TTE 8/25 no vegetations noted   2. Fevers - has a tmax of 102. 7 yesterday. WBC is 3.9, clinically she looks improving. Denies diarrhea/Nausea and vomiting   3. Leukopenia   3. Acute Encephalopathy, improved  - She was having breakfast today, seems to be more awake and alert.  Recommendations  - Continue cefepime 2 g iv q8 hrs as is  - Primary team has already consulted Ortho. Will follow their recs - Monitor CBC ( has new leukopenia) and CMP while on IV abx - Following fever curve and repeat blood cultures - Following   Dr Baxter Flattery is on call this weekend and available with any questions. Dr Linus Salmons will take over ID consult service from Monday.   Rest of the management as per the primary team. Thank you for the consult. Please  page with pertinent questions or concerns. Please call us with questions or concerns   Rosiland Oz, MD Infectious [place] [facility]  ______________________________________________________________________ Subjective patient seen and examined at the bedside. She seems to be more awake, alert and oriented today.She is having breakfast. She is tolerating the antibiotics well without any side effects  Objective BP 117/71 (BP Location: Left Arm)   Pulse 64   Temp 98.9 F (37.2 C) (Oral)   Resp 20   Ht 5\' 1"  (1.549 m)   Wt 76.2 kg   SpO2 99%   BMI 31.74 kg/m     General - Not in acute distress, more awake and alert  HEENT- PERRLA, pale conjunctiva Chest - Clear bilateral air entry CVS - Normal s1s2, RRR Abdomen - soft Extremities - no pedal edema Skin - no rashes  RT shoulder in an arm sling  LINES/TUBES: PIVs only    Microbiology Results for orders placed or performed during the hospital encounter of 09/06/19  Blood Culture (routine x 2)     Status: Abnormal   Collection Time: 09/06/19  4:17 PM   Specimen: BLOOD LEFT FOREARM  Result Value Ref Range Status   Specimen Description BLOOD LEFT FOREARM  Final   Special Requests   Final    BOTTLES DRAWN AEROBIC AND ANAEROBIC Blood Culture adequate volume   Culture  Setup Time   Final    GRAM NEGATIVE RODS IN BOTH AEROBIC AND ANAEROBIC BOTTLES Organism ID to follow CRITICAL RESULT CALLED TO, READ BACK BY AND VERIFIED WITH: Molly Maduro PharmD 9:10 09/07/19 ([email or web address])  Performed at [facility], [place] [address], [place], [place] [address]    Culture ENTEROBACTER AEROGENES (A)  Final   Report Status 09/09/2019 FINAL  Final   Organism ID, Bacteria ENTEROBACTER AEROGENES  Final      Susceptibility   Enterobacter aerogenes - MIC*    CEFAZOLIN >=64 RESISTANT Resistant     CEFEPIME <=0.12 SENSITIVE Sensitive     CEFTAZIDIME <=1 SENSITIVE Sensitive     CEFTRIAXONE <=0.25 SENSITIVE Sensitive      CIPROFLOXACIN <=0.25 SENSITIVE Sensitive     GENTAMICIN <=1 SENSITIVE Sensitive     IMIPENEM 2 SENSITIVE Sensitive     TRIMETH/SULFA <=20 SENSITIVE Sensitive     PIP/TAZO <=4 SENSITIVE Sensitive     * ENTEROBACTER AEROGENES  Blood Culture ID Panel (Reflexed)     Status: Abnormal   Collection Time: 09/06/19  4:17 PM  Result Value Ref Range Status   Enterococcus faecalis NOT DETECTED NOT DETECTED Final   Enterococcus Faecium NOT DETECTED NOT DETECTED Final   Listeria monocytogenes NOT DETECTED NOT DETECTED Final   Staphylococcus species NOT DETECTED NOT DETECTED Final   Staphylococcus aureus (BCID) NOT DETECTED NOT DETECTED Final   Staphylococcus epidermidis NOT DETECTED NOT DETECTED Final   Staphylococcus lugdunensis NOT DETECTED NOT DETECTED Final   Streptococcus species NOT DETECTED NOT DETECTED Final   Streptococcus agalactiae NOT DETECTED NOT DETECTED Final   Streptococcus pneumoniae NOT DETECTED NOT DETECTED Final   Streptococcus pyogenes NOT DETECTED NOT DETECTED Final   A.calcoaceticus-baumannii NOT DETECTED NOT DETECTED Final   Bacteroides fragilis NOT DETECTED NOT DETECTED Final   Enterobacterales DETECTED (A) NOT DETECTED Final    Comment: Enterobacterales represent a large order of gram negative bacteria, not a single organism. CRITICAL RESULT CALLED TO, READ BACK BY AND VERIFIED WITH: Molly Maduro PharmD 9:10 09/07/19 ([email or web address])    Enterobacter cloacae complex NOT DETECTED NOT DETECTED Final   Escherichia coli NOT DETECTED NOT DETECTED Final   Klebsiella aerogenes DETECTED (A) NOT DETECTED Final    Comment: CRITICAL RESULT CALLED TO, READ BACK BY AND VERIFIED WITH: Molly Maduro PharmD 9:10 09/07/19 ([email or web address])    Klebsiella oxytoca NOT DETECTED NOT DETECTED Final   Klebsiella pneumoniae NOT DETECTED NOT DETECTED Final   Proteus species NOT DETECTED NOT DETECTED Final   Salmonella species NOT DETECTED NOT DETECTED Final   Serratia marcescens NOT DETECTED NOT DETECTED Final    Haemophilus influenzae NOT DETECTED NOT DETECTED Final   Neisseria meningitidis NOT DETECTED NOT DETECTED Final   Pseudomonas aeruginosa NOT DETECTED NOT DETECTED Final   Stenotrophomonas maltophilia NOT DETECTED NOT DETECTED Final   Candida albicans NOT DETECTED NOT DETECTED Final   Candida auris NOT DETECTED NOT DETECTED Final   Candida glabrata NOT DETECTED NOT DETECTED Final   Candida krusei NOT DETECTED NOT DETECTED Final   Candida parapsilosis NOT DETECTED NOT DETECTED Final   Candida tropicalis NOT DETECTED NOT DETECTED Final   Cryptococcus neoformans/gattii NOT DETECTED NOT DETECTED Final   CTX-M ESBL NOT DETECTED NOT DETECTED Final   Carbapenem resistance IMP NOT DETECTED NOT DETECTED Final   Carbapenem resistance KPC NOT DETECTED NOT DETECTED Final   Carbapenem resistance NDM NOT DETECTED NOT DETECTED Final   Carbapenem resist OXA 48 LIKE NOT DETECTED NOT DETECTED Final   Carbapenem resistance VIM NOT DETECTED NOT DETECTED Final    Comment: Performed at [facility], [address] [place], [place] [address]  Urine culture     Status: None   Collection Time:  09/06/19  4:18 PM   Specimen: In/Out Cath Urine  Result Value Ref Range Status   Specimen Description IN/OUT CATH URINE  Final   Special Requests NONE  Final   Culture   Final    NO GROWTH Performed at [facility], [address] [place], [place] [address]    Report Status 09/07/2019 FINAL  Final  SARS Coronavirus 2 by RT PCR (hospital order, performed in [facility] hospital lab) Nasopharyngeal Urine, Catheterized     Status: None   Collection Time: 09/06/19  4:18 PM   Specimen: Urine, Catheterized; Nasopharyngeal  Result Value Ref Range Status   SARS Coronavirus 2 NEGATIVE NEGATIVE Final    Comment: (NOTE) SARS-CoV-2 target nucleic acids are NOT DETECTED.  The SARS-CoV-2 RNA is generally detectable in upper and lower respiratory specimens during the acute phase of infection. The  lowest concentration of SARS-CoV-2 viral copies this assay can detect is 250 copies / mL. A negative result does not preclude SARS-CoV-2 infection and should not be used as the sole basis for treatment or other patient management decisions.  A negative result may occur with improper specimen collection / handling, submission of specimen other than nasopharyngeal swab, presence of viral mutation(s) within the areas targeted by this assay, and inadequate number of viral copies (<250 copies / mL). A negative result must be combined with clinical observations, patient history, and epidemiological information.  Fact Sheet for Patients:   StrictlyIdeas.no  Fact Sheet for Healthcare Providers: BankingDealers.co.za  This test is not yet approved or  cleared by the [place] FDA and has been authorized for detection and/or diagnosis of SARS-CoV-2 by FDA under an Emergency Use Authorization (EUA).  This EUA will remain in effect (meaning this test can be used) for the duration of the COVID-19 declaration under Section 564(b)(1) of the Act, 21 U.S.C. section 360bbb-3(b)(1), unless the authorization is terminated or revoked sooner.  Performed at [facility], [place] [address], [place], [place] [address]   Blood Culture (routine x 2)     Status: Abnormal   Collection Time: 09/06/19  4:21 PM   Specimen: BLOOD  Result Value Ref Range Status   Specimen Description BLOOD LEFT UPPER ARM  Final   Special Requests   Final    BOTTLES DRAWN AEROBIC AND ANAEROBIC Blood Culture adequate volume   Culture  Setup Time   Final    GRAM NEGATIVE RODS IN BOTH AEROBIC AND ANAEROBIC BOTTLES CRITICAL VALUE NOTED.  VALUE IS CONSISTENT WITH PREVIOUSLY REPORTED AND CALLED VALUE.    Culture (A)  Final    ENTEROBACTER AEROGENES SUSCEPTIBILITIES PERFORMED ON PREVIOUS CULTURE WITHIN THE LAST 5 DAYS. Performed at [facility], [place] [address],  [place], [place] [address]    Report Status 09/09/2019 FINAL  Final     Pertinent Lab CBC Latest Ref Rng & Units 09/09/2019 09/08/2019 09/06/2019  WBC 4.0 - 10.5 K/uL 3.9(L) 4.6 13.0(H)  Hemoglobin 12.0 - 15.0 g/dL 8.9(L) 10.5(L) 9.9(L)  Hematocrit 36 - 46 % 27.7(L) 31.9(L) 31.5(L)  Platelets 150 - 400 K/uL 183 202 283   CMP Latest Ref Rng & Units 09/09/2019 09/08/2019 09/07/2019  Glucose 70 - 99 mg/dL 146(H) 116(H) 134(H)  BUN 8 - 23 mg/dL 15 15 14   Creatinine 0.44 - 1.00 mg/dL 0.50 0.62 0.60  Sodium 135 - 145 mmol/L 136 136 138  Potassium 3.5 - 5.1 mmol/L 3.3(L) 2.9(L) 3.4(L)  Chloride 98 - 111 mmol/L 108 106 107  CO2  22 - 32 mmol/L 19(L) 19(L) 19(L)  Calcium 8.9 - 10.3 mg/dL 8.0(L) 8.3(L) 8.2(L)  Total Protein 6.5 - 8.1 g/dL 5.2(L) - 5.2(L)  Total Bilirubin 0.3 - 1.2 mg/dL 0.4 - 0.7  Alkaline Phos 38 - 126 U/L 127(H) - 148(H)  AST 15 - 41 U/L 31 - 49(H)  ALT 0 - 44 U/L 31 - 44     Pertinent Imaging today Plain films and CT images have been personally visualized and interpreted; radiology reports have been reviewed. Decision making incorporated into the Impression / Recommendations.  09/09/19 MRI Rt shoulder WO contrast  IMPRESSION: 1. Accentuated edema signal in the humeral shaft just distal to the stem of the humeral component of the prosthesis. Given that the arthroplasty was only about 2 weeks ago, this degree of residual edema is not necessarily considered abnormal, but clearly early osteomyelitis is difficult to exclude. 2. Low-grade infiltrative edema in the subscapularis, infraspinatus, and teres minor muscles. 3. Low-grade infiltrative edema tracking within along the superficial fascia margins of the deltoid muscle and along the fascial margins of the teres major muscle. 4. Suspected prior acromioplasty. 5. No obvious effusion extending beyond the boundaries of the metal artifact, and no definite drainable fluid collection. Regional ultrasound may be helpful in assessing  for fluid pockets of might be otherwise obscured by the metal artifact. 6. Markedly atrophic supraspinatus muscle may be an indicator of chronic supraspinatus tear.

## 2019-09-09 NOTE — Progress Notes (Signed)
Initial Nutrition Assessment  DOCUMENTATION CODES:   Obesity unspecified  INTERVENTION:   -30 ml Prosource Plus BID, each supplement provides 100 kcals and 15 grams protein -MVI with minerals daily -Magic cup TID with meals, each supplement provides 290 kcal and 9 grams of protein -Hormel Shake TID with meals, each supplement provides 520 kcals and 22 grams protein -Feeding assistance with meals  NUTRITION DIAGNOSIS:   Inadequate oral intake related to lethargy/confusion as evidenced by meal completion < 25%.  GOAL:   Patient will meet greater than or equal to 90% of their needs  MONITOR:   PO intake, Supplement acceptance, Diet advancement, Labs, Weight trends, Skin, I & O's  REASON FOR ASSESSMENT:   Consult Assessment of nutrition requirement/status  ASSESSMENT:   Samantha Aguirre is a 74 y.o. female with medical history significant of paroxysmal A. fib, chronic diastolic CHF, COPD, asthma, CAD, noninsulin-dependent type II diabetes, hypertension, hyperlipidemia, seizure disorder, CVA, hypothyroidism, GERD, recent reverse right total shoulder arthroplasty on 08/25/2019 presenting to the ED via EMS from [facility] for evaluation of fever and altered mental status.  Pt admitted with fever and AMS.   8/25- s/p BSE- advanced to dysphagia 1 diet with thin liquids  Reviewed I/O's: +800 ml x 24 hours and +1.1 L x 24 hours  Pt very confused at time of visit. When questions were asked, pt mostly repeated back what this RD said to her. Noted meal tray was in front of pt, untouched other than coffee and about 1/2 cup of juice.Documented meal completion 5-25%. Observed pt struggle to pick up utensils. RD offered to help pt assist with feeding, however, pt adamantly refused offer.  Reviewed wt hx; wt has been stable over the past 6 months.  Lab Results  Component Value Date   HGBA1C 6.3 (H) 08/22/2019   PTA DM medications are 5-500 mg glipizide-metformin BID.   Per TOC notes,  plan to d/c to SNF once medically stable.   Medications reviewed and include folvite and keppra.   Labs reviewed: CBGS: [id number] (inpatient orders for glycemic control are 0-9 units insulin aspart every 4 hours).   NUTRITION - FOCUSED PHYSICAL EXAM:    Most Recent Value  Orbital Region No depletion  Upper Arm Region Moderate depletion  Thoracic and Lumbar Region No depletion  Buccal Region No depletion  Temple Region No depletion  Clavicle Bone Region No depletion  Clavicle and Acromion Bone Region No depletion  Scapular Bone Region No depletion  Dorsal Hand No depletion  Patellar Region No depletion  Anterior Thigh Region No depletion  Posterior Calf Region No depletion  Edema (RD Assessment) Mild  Hair Reviewed  Eyes Reviewed  Mouth Reviewed  Skin Reviewed  Nails Reviewed       Diet Order:   Diet Order            DIET - DYS 1 Room service appropriate? No; Fluid consistency: Thin  Diet effective now                 EDUCATION NEEDS:   No education needs have been identified at this time  Skin:  Skin Assessment: Skin Integrity Issues: Skin Integrity Issues:: Incisions Incisions: closed rt shoulder  Last BM:  08/26/19  Height:   Ht Readings from Last 1 Encounters:  09/06/19 5\' 1"  (1.549 m)    Weight:   Wt Readings from Last 1 Encounters:  09/06/19 76.2 kg    Ideal Body Weight:  47.7 kg  BMI:  Body mass  index is 31.74 kg/m.  Estimated Nutritional Needs:   Kcal:  1500-1700  Protein:  80-95 grams  Fluid:  > 1.5 L    Loistine Chance, RD, LDN, [place] Registered Dietitian II Certified Diabetes Care and Education Specialist Please refer to [facility] for RD and/or RD on-call/weekend/after hours pager

## 2019-09-09 NOTE — Progress Notes (Signed)
Due to her CrCl, ok to reduce her gabapentin to 600mg  BID per discussion with Dr. Karleen Hampshire on 8/26.  Onnie Boer, PharmD, BCIDP, AAHIVP, CPP Infectious Disease Pharmacist 09/09/2019 1:54 PM

## 2019-09-10 ENCOUNTER — Inpatient Hospital Stay: Payer: Self-pay

## 2019-09-10 DIAGNOSIS — F0150 Vascular dementia without behavioral disturbance: Secondary | ICD-10-CM

## 2019-09-10 DIAGNOSIS — Z515 Encounter for palliative care: Secondary | ICD-10-CM

## 2019-09-10 DIAGNOSIS — R7881 Bacteremia: Secondary | ICD-10-CM

## 2019-09-10 DIAGNOSIS — Y92129 Unspecified place in nursing home as the place of occurrence of the external cause: Secondary | ICD-10-CM

## 2019-09-10 DIAGNOSIS — W19XXXA Unspecified fall, initial encounter: Secondary | ICD-10-CM

## 2019-09-10 LAB — COMPREHENSIVE METABOLIC PANEL
ALT: 35 U/L (ref 0–44)
AST: 43 U/L — ABNORMAL HIGH (ref 15–41)
Albumin: 2 g/dL — ABNORMAL LOW (ref 3.5–5.0)
Alkaline Phosphatase: 124 U/L (ref 38–126)
Anion gap: 10 (ref 5–15)
BUN: 12 mg/dL (ref 8–23)
CO2: 20 mmol/L — ABNORMAL LOW (ref 22–32)
Calcium: 8.5 mg/dL — ABNORMAL LOW (ref 8.9–10.3)
Chloride: 109 mmol/L (ref 98–111)
Creatinine, Ser: 0.52 mg/dL (ref 0.44–1.00)
GFR calc Af Amer: >60 mL/min (ref >60)
GFR calc non Af Amer: >60 mL/min (ref >60)
Glucose, Bld: 109 mg/dL — ABNORMAL HIGH (ref 70–99)
Potassium: 3.7 mmol/L (ref 3.5–5.1)
Sodium: 139 mmol/L (ref 135–145)
Total Bilirubin: 0.6 mg/dL (ref 0.3–1.2)
Total Protein: 5.4 g/dL — ABNORMAL LOW (ref 6.5–8.1)

## 2019-09-10 LAB — CBC WITH DIFFERENTIAL/PLATELET
Abs Immature Granulocytes: 0.03 10*3/uL (ref 0.00–0.07)
Basophils Absolute: 0 10*3/uL (ref 0.0–0.1)
Basophils Relative: 1 %
Eosinophils Absolute: 0.1 10*3/uL (ref 0.0–0.5)
Eosinophils Relative: 2 %
HCT: 28.6 % — ABNORMAL LOW (ref 36.0–46.0)
Hemoglobin: 9.2 g/dL — ABNORMAL LOW (ref 12.0–15.0)
Immature Granulocytes: 1 %
Lymphocytes Relative: 16 %
Lymphs Abs: 0.7 10*3/uL (ref 0.7–4.0)
MCH: 28.4 pg (ref 26.0–34.0)
MCHC: 32.2 g/dL (ref 30.0–36.0)
MCV: 88.3 fL (ref 80.0–100.0)
Monocytes Absolute: 0.6 10*3/uL (ref 0.1–1.0)
Monocytes Relative: 14 %
Neutro Abs: 2.7 10*3/uL (ref 1.7–7.7)
Neutrophils Relative %: 66 %
Platelets: 210 10*3/uL (ref 150–400)
RBC: 3.24 MIL/uL — ABNORMAL LOW (ref 3.87–5.11)
RDW: 14.5 % (ref 11.5–15.5)
WBC: 4.1 10*3/uL (ref 4.0–10.5)
nRBC: 0 % (ref 0.0–0.2)

## 2019-09-10 LAB — GLUCOSE, CAPILLARY
Glucose-Capillary: 100 mg/dL — ABNORMAL HIGH (ref 70–99)
Glucose-Capillary: 100 mg/dL — ABNORMAL HIGH (ref 70–99)
Glucose-Capillary: 107 mg/dL — ABNORMAL HIGH (ref 70–99)
Glucose-Capillary: 112 mg/dL — ABNORMAL HIGH (ref 70–99)
Glucose-Capillary: 119 mg/dL — ABNORMAL HIGH (ref 70–99)
Glucose-Capillary: 153 mg/dL — ABNORMAL HIGH (ref 70–99)

## 2019-09-10 LAB — MAGNESIUM: Magnesium: 2 mg/dL (ref 1.7–2.4)

## 2019-09-10 MED ORDER — HALOPERIDOL LACTATE 5 MG/ML IJ SOLN
2.0000 mg | Freq: Four times a day (QID) | INTRAMUSCULAR | Status: DC | PRN
  Administered 2019-09-10 – 2019-09-11 (×2): 2 mg via INTRAVENOUS
  Filled 2019-09-10 (×3): qty 1

## 2019-09-10 MED ORDER — ACETAMINOPHEN 325 MG PO TABS
650.0000 mg | ORAL_TABLET | Freq: Three times a day (TID) | ORAL | Status: DC
  Administered 2019-09-10 – 2019-09-12 (×7): 650 mg via ORAL
  Filled 2019-09-10 (×7): qty 2

## 2019-09-10 NOTE — Progress Notes (Signed)
Pharmacy Antibiotic Note  Samantha Aguirre is a 74 y.o. female admitted on 09/06/2019 with bacteremia.  Pharmacy has been consulted for Cefepime dosing.  She is now on D4 of cefepime for klebsiella (formerly enterobacter) aerogenes bacteremia. It's sensitive to cefepime. She has been afebrile since 8/27. ID and ortho are on board for source determination. Her renal function has remained stable.   Scr <1 Wbc wnl   Plan: Continue cefepime 2g IV q8   Height: 5\' 1"  (154.9 cm) Weight: 76.2 kg (167 lb 15.9 oz) IBW/kg (Calculated) : 47.8  Temp (24hrs), Avg:98.3 F (36.8 C), Min:97.8 F (36.6 C), Max:98.9 F (37.2 C)  Recent Labs  Lab 09/06/19 1617 09/07/19 0557 09/08/19 0956 09/08/19 1356 09/09/19 0502 09/10/19 0143  WBC 13.0*  --   --  4.6 3.9* 4.1  CREATININE 0.76 0.60 0.62  --  0.50 0.52  LATICACIDVEN 1.5  --   --   --   --   --     Estimated Creatinine Clearance: 56.8 mL/min (by C-G formula based on SCr of 0.52 mg/dL).    Allergies  Allergen Reactions  . Ivp Dye [Iodinated Diagnostic Agents] Itching    Pt injected with IV contrast.  5 min after injection pt complained of itching behind ear and on her abd.  . Methotrexate Rash  . Morphine And Related Other (See Comments)    "stops my breathing" NEAR-RESPIRATORY ARREST  . Mushroom Extract Complex Anaphylaxis    Made patient "deathly sick"  . Atenolol Other (See Comments)    "MD took me off of it bc it was doing something wrong" Made patient HYPOTENSIVE  . Percocet [Oxycodone-Acetaminophen] Nausea And Vomiting and Other (See Comments)    Stomach pains  . Percodan [Oxycodone-Aspirin] Nausea And Vomiting and Other (See Comments)    Stomach pains  . Tramadol Nausea Only  . Verapamil Other (See Comments)    " MD told me this was screwing me up" MAKES PATIENT HYPOTENSIVE  . Oxycodone Hcl    Onnie Boer, PharmD, [place], AAHIVP, CPP Infectious Disease Pharmacist 09/10/2019 8:57 AM

## 2019-09-10 NOTE — Plan of Care (Signed)

## 2019-09-10 NOTE — Progress Notes (Signed)
PROGRESS NOTE    Samantha Aguirre  KZL:[id number] DOB: 08-24-1945 DOA: 09/06/2019 PCP: Tracie Harrier, MD    Chief Complaint  Patient presents with  . Altered Mental Status  . Fall    Brief Narrative: 74 year old lady prior history of paroxysmal atrial fibrillation, chronic diastolic heart failure, COPD, asthma, noninsulin-dependent type 2 diabetes mellitus, essential hypertension, hyperlipidemia, seizure disorder, history of CVA in the past, hypothyroidism, coronary artery disease, GERD, recent right shoulder arthroplasty on 08/25/2019 presents to ED from [facility] for evaluation of fever and altered mental status.  History of unwitnessed fall on Saturday.  Patient is confused and history was not available on admission.  On arrival to ED she was febrile tachypneic, had leukocytosis.  Blood cultures grew Klebsiella aerogenes in all the bottles. Chest x-ray does not show any pneumonia urine analysis is negative, CT of the head is negative for acute intracranial abnormality.  CT of the abdomen pelvis does not show any acute infectious source.  X-rays of the knee negative for acute fracture or malalignment. She was initially started on IV vancomycin, ampicillin and Rocephin for evaluation of meningitis, later on transitioned to IV cefepime for Klebsiella bacteremia.  Patient seen and examined at bedside she is alert oriented to person and place this morning.  Was able to answer simple questions and following commands.  PT evaluation recommending SNF on discharge.  Meanwhile ID consulted for evaluation of Enterobacter bacteremia.  Echocardiogram ordered for evaluation of heart valves.  Ultrasound of the right shoulder ordered to see if she has any signs of septic joint. PT evaluation recommending SNF.    Assessment & Plan:   Principal Problem:   AMS (altered mental status) Active Problems:   Acute on chronic anemia   Fever   SIRS (systemic inflammatory response syndrome) (HCC)    Hypokalemia   Bacteremia   Sepsis with encephalopathy without septic shock ([place])   Fall at nursing home   Vascular dementia without behavioral disturbance ([place])  Sepsis with Enterobacter aerogenes bacteremia.  Patient was empirically started on IV vancomycin, Rocephin and ampicillin later on transitioned to IV cefepime.   ID consulted, recommended 14 days of I V cefepime . Repeat cultures are negative . PICC line will be placed.  Unclear source as her urine analysis is negative for infection.  CT of the abdomen and pelvis does not show any acute intra-abdominal pathology. MRI of the shoulder shows Accentuated edema signal in the humeral shaft just distal to the stem of the humeral component of the prosthesis. Given that the arthroplasty was only about 2 weeks ago, this degree of residual edema is not necessarily considered abnormal, but clearly early osteomyelitis is difficult to exclude. Discussed with ortho, suggested that right shoulder is probably not the source of infection.      Acute metabolic encephalopathy Probably secondary to sepsis vs from being off keppra. She was also found to have elevated ammonia levels with mildly elevated liver enzymes and elevated alkaline phosphatase.  Repeat liver enzymes wnl.  Patient is confused to day , agitated, delirious. She will need re orientation, and prn haldal.    Seizures :  Pt has been off keppra for the last two weeks of unclear etiology.  Restarted the keppra and topamax.   Anemia of chronic disease and anemia of blood loss probably from right shoulder arthroplasty. Baseline hemoglobin appears to be around 12 dropped to 11 post surgery and currently at 8.9.  L0W iron levels and low folate levels on anemia panel.  Replace iron and folate.   Hypokalemia Replaced.    Essential hypertension:  Well controlled.    Type 2 DM:  CBG (last 3)  Recent Labs    09/10/19 0825 09/10/19 1124 09/10/19 1515  GLUCAP 119* 153* 100*    Resume sliding scale insulin. Hemoglobin A1c is 6.3%    Copd:  No wheezing heard.     Chronic diastolic heart failure Last echocardiogram from October 2020 shows left ventricular ejection fraction of 55 to 60% with normal function.  She appears compensated.  Lasix on hold for now.     Hypothyroidism Continue with Synthroid 125 MCG daily.   Hyperlipidemia Continue with Lipitor 80 mg daily.    History of coronary artery disease, CVA in the past. LP cancelled. Resume aspirin and plavix.    Mild metabolic acidosis, non-anion gap Improved to 20.    Nutrition:  SLP evalution recommending dysphagia 1 diet / Pureed diet with thin liquid.  Liquids provided via: Cup;Straw Medication Administration: Crushed with puree Compensations: Minimize environmental distractions;Slow rate   DVT prophylaxis: SCDs Code Status: DNR Family Communication: None at bedside discussed with daughter over the phone.  Disposition:   Status is: Inpatient  Remains inpatient appropriate because:Unsafe d/c plan and IV treatments appropriate due to intensity of illness or inability to take PO   Dispo: The patient is from: Home              Anticipated d/c is to: Pending              Anticipated d/c date is: > 3 days              Patient currently is not medically stable to d/c.       Consultants:   Infectious disease  Procedures: None Antimicrobials: ( Anti-infectives (From admission, onward)   Start     Dose/Rate Route Frequency Ordered Stop   09/07/19 1200  ceFEPIme (MAXIPIME) 2 g in sodium chloride 0.9 % 100 mL IVPB        2 g 200 mL/hr over 30 Minutes Intravenous Every 8 hours 09/07/19 1058     09/07/19 0800  vancomycin (VANCOREADY) IVPB 750 mg/150 mL  Status:  Discontinued        750 mg 150 mL/hr over 60 Minutes Intravenous Every 12 hours 09/06/19 1930 09/07/19 1100   09/07/19 0600  cefTRIAXone (ROCEPHIN) 2 g in sodium chloride 0.9 % 100 mL IVPB  Status:  Discontinued         2 g 200 mL/hr over 30 Minutes Intravenous Every 12 hours 09/06/19 2018 09/07/19 1045   09/06/19 2030  cefTRIAXone (ROCEPHIN) 1 g in sodium chloride 0.9 % 100 mL IVPB        1 g 200 mL/hr over 30 Minutes Intravenous  Once 09/06/19 2018 09/07/19 0132   09/06/19 2030  ampicillin (OMNIPEN) 2 g in sodium chloride 0.9 % 100 mL IVPB  Status:  Discontinued        2 g 300 mL/hr over 20 Minutes Intravenous Every 4 hours 09/06/19 2018 09/07/19 1100   09/06/19 2000  vancomycin (VANCOREADY) IVPB 1750 mg/350 mL        1,750 mg 175 mL/hr over 120 Minutes Intravenous  Once 09/06/19 1930 09/06/19 2238   09/06/19 1600  cefTRIAXone (ROCEPHIN) 1 g in sodium chloride 0.9 % 100 mL IVPB  Status:  Discontinued        1 g 200 mL/hr over 30 Minutes Intravenous Every 24 hours 09/06/19 1549 09/06/19 2018  Subjective: Pt agitated. Confused ad trying to get out of bed.   Objective: Vitals:   09/10/19 0721 09/10/19 1200 09/10/19 1545 09/10/19 1609  BP: 112/70 132/78  (!) 123/59  Pulse: 88 70  (!) 58  Resp: (!) 25  (!) 23 13  Temp: 97.9 F (36.6 C) 98 F (36.7 C)  (!) 97.5 F (36.4 C)  TempSrc: Oral Oral  Oral  SpO2: 92%   99%  Weight:      Height:        Intake/Output Summary (Last 24 hours) at 09/10/2019 1718 Last data filed at 09/10/2019 1600 Gross per 24 hour  Intake 624.81 ml  Output 1000 ml  Net -375.19 ml   Filed Weights   09/06/19 1523  Weight: 76.2 kg    Examination:  General exam: alert and comfortable.  Respiratory system: air entry fair, no wheezing or rhonchi.  Cardiovascular system: S1S2 heard, RRR, no JVD. No pedal edema.  Gastrointestinal system:  abd is soft, non tender non distended. Bowel sounds wnl.  Central nervous system: Patient is alert , confused.  Extremities: right shoulder joint bandaged. In slind.  Skin: No rashes seen.  Psychiatry: mood is appropriate.   Data Reviewed: I have personally reviewed following labs and imaging studies  CBC: Recent Labs   Lab 09/06/19 1617 09/08/19 1356 09/09/19 0502 09/10/19 0143  WBC 13.0* 4.6 3.9* 4.1  NEUTROABS 11.8* 4.0 3.0 2.7  HGB 9.9* 10.5* 8.9* 9.2*  HCT 31.5* 31.9* 27.7* 28.6*  MCV 92.6 90.1 88.8 88.3  PLT 283 202 183 749    Basic Metabolic Panel: Recent Labs  Lab 09/06/19 1617 09/06/19 2035 09/07/19 0557 09/08/19 0956 09/08/19 1356 09/09/19 0502 09/10/19 0143  NA 137  --  138 136  --  136 139  K 3.2*  --  3.4* 2.9*  --  3.3* 3.7  CL 105  --  107 106  --  108 109  CO2 21*  --  19* 19*  --  19* 20*  GLUCOSE 172*  --  134* 116*  --  146* 109*  BUN 14  --  14 15  --  15 12  CREATININE 0.76  --  0.60 0.62  --  0.50 0.52  CALCIUM 8.1*  --  8.2* 8.3*  --  8.0* 8.5*  MG  --  1.7  --   --  1.9 2.0 2.0    GFR: Estimated Creatinine Clearance: 56.8 mL/min (by C-G formula based on SCr of 0.52 mg/dL).  Liver Function Tests: Recent Labs  Lab 09/06/19 1617 09/07/19 0557 09/09/19 0502 09/10/19 0143  AST 51* 49* 31 43*  ALT 46* 44 31 35  ALKPHOS 167* 148* 127* 124  BILITOT 1.4* 0.7 0.4 0.6  PROT 5.8* 5.2* 5.2* 5.4*  ALBUMIN 2.6* 2.3* 1.9* 2.0*    CBG: Recent Labs  Lab 09/09/19 2311 09/10/19 0314 09/10/19 0825 09/10/19 1124 09/10/19 1515  GLUCAP 113* 107* 119* 153* 100*     Recent Results (from the past 240 hour(s))  Blood Culture (routine x 2)     Status: Abnormal   Collection Time: 09/06/19  4:17 PM   Specimen: BLOOD LEFT FOREARM  Result Value Ref Range Status   Specimen Description BLOOD LEFT FOREARM  Final   Special Requests   Final    BOTTLES DRAWN AEROBIC AND ANAEROBIC Blood Culture adequate volume   Culture  Setup Time   Final    GRAM NEGATIVE RODS IN BOTH AEROBIC AND ANAEROBIC BOTTLES Organism ID  to follow CRITICAL RESULT CALLED TO, READ BACK BY AND VERIFIED WITH: Molly Maduro PharmD 9:10 09/07/19 ([email or web address]) Performed at [facility], [place] [address], [place], [place] [address]    Culture ENTEROBACTER AEROGENES (A)  Final   Report Status 09/09/2019 FINAL   Final   Organism ID, Bacteria ENTEROBACTER AEROGENES  Final      Susceptibility   Enterobacter aerogenes - MIC*    CEFAZOLIN >=64 RESISTANT Resistant     CEFEPIME <=0.12 SENSITIVE Sensitive     CEFTAZIDIME <=1 SENSITIVE Sensitive     CEFTRIAXONE <=0.25 SENSITIVE Sensitive     CIPROFLOXACIN <=0.25 SENSITIVE Sensitive     GENTAMICIN <=1 SENSITIVE Sensitive     IMIPENEM 2 SENSITIVE Sensitive     TRIMETH/SULFA <=20 SENSITIVE Sensitive     PIP/TAZO <=4 SENSITIVE Sensitive     * ENTEROBACTER AEROGENES  Blood Culture ID Panel (Reflexed)     Status: Abnormal   Collection Time: 09/06/19  4:17 PM  Result Value Ref Range Status   Enterococcus faecalis NOT DETECTED NOT DETECTED Final   Enterococcus Faecium NOT DETECTED NOT DETECTED Final   Listeria monocytogenes NOT DETECTED NOT DETECTED Final   Staphylococcus species NOT DETECTED NOT DETECTED Final   Staphylococcus aureus (BCID) NOT DETECTED NOT DETECTED Final   Staphylococcus epidermidis NOT DETECTED NOT DETECTED Final   Staphylococcus lugdunensis NOT DETECTED NOT DETECTED Final   Streptococcus species NOT DETECTED NOT DETECTED Final   Streptococcus agalactiae NOT DETECTED NOT DETECTED Final   Streptococcus pneumoniae NOT DETECTED NOT DETECTED Final   Streptococcus pyogenes NOT DETECTED NOT DETECTED Final   A.calcoaceticus-baumannii NOT DETECTED NOT DETECTED Final   Bacteroides fragilis NOT DETECTED NOT DETECTED Final   Enterobacterales DETECTED (A) NOT DETECTED Final    Comment: Enterobacterales represent a large order of gram negative bacteria, not a single organism. CRITICAL RESULT CALLED TO, READ BACK BY AND VERIFIED WITH: Molly Maduro PharmD 9:10 09/07/19 ([email or web address])    Enterobacter cloacae complex NOT DETECTED NOT DETECTED Final   Escherichia coli NOT DETECTED NOT DETECTED Final   Klebsiella aerogenes DETECTED (A) NOT DETECTED Final    Comment: CRITICAL RESULT CALLED TO, READ BACK BY AND VERIFIED WITH: Molly Maduro PharmD 9:10 09/07/19  ([email or web address])    Klebsiella oxytoca NOT DETECTED NOT DETECTED Final   Klebsiella pneumoniae NOT DETECTED NOT DETECTED Final   Proteus species NOT DETECTED NOT DETECTED Final   Salmonella species NOT DETECTED NOT DETECTED Final   Serratia marcescens NOT DETECTED NOT DETECTED Final   Haemophilus influenzae NOT DETECTED NOT DETECTED Final   Neisseria meningitidis NOT DETECTED NOT DETECTED Final   Pseudomonas aeruginosa NOT DETECTED NOT DETECTED Final   Stenotrophomonas maltophilia NOT DETECTED NOT DETECTED Final   Candida albicans NOT DETECTED NOT DETECTED Final   Candida auris NOT DETECTED NOT DETECTED Final   Candida glabrata NOT DETECTED NOT DETECTED Final   Candida krusei NOT DETECTED NOT DETECTED Final   Candida parapsilosis NOT DETECTED NOT DETECTED Final   Candida tropicalis NOT DETECTED NOT DETECTED Final   Cryptococcus neoformans/gattii NOT DETECTED NOT DETECTED Final   CTX-M ESBL NOT DETECTED NOT DETECTED Final   Carbapenem resistance IMP NOT DETECTED NOT DETECTED Final   Carbapenem resistance KPC NOT DETECTED NOT DETECTED Final   Carbapenem resistance NDM NOT DETECTED NOT DETECTED Final   Carbapenem resist OXA 48 LIKE NOT DETECTED NOT DETECTED Final   Carbapenem resistance VIM NOT DETECTED NOT DETECTED Final    Comment: Performed at [facility], [address], [place], [place] [address]  Urine culture     Status: None   Collection Time: 09/06/19  4:18 PM   Specimen: In/Out Cath Urine  Result Value Ref Range Status   Specimen Description IN/OUT CATH URINE  Final   Special Requests NONE  Final   Culture   Final    NO GROWTH Performed at [facility], [place] [address], [place], [place] [address]    Report Status 09/07/2019 FINAL  Final  SARS Coronavirus 2 by RT PCR (hospital order, performed in [facility] hospital lab) Nasopharyngeal Urine, Catheterized     Status: None   Collection Time: 09/06/19  4:18 PM   Specimen: Urine, Catheterized; Nasopharyngeal  Result  Value Ref Range Status   SARS Coronavirus 2 NEGATIVE NEGATIVE Final    Comment: (NOTE) SARS-CoV-2 target nucleic acids are NOT DETECTED.  The SARS-CoV-2 RNA is generally detectable in upper and lower respiratory specimens during the acute phase of infection. The lowest concentration of SARS-CoV-2 viral copies this assay can detect is 250 copies / mL. A negative result does not preclude SARS-CoV-2 infection and should not be used as the sole basis for treatment or other patient management decisions.  A negative result may occur with improper specimen collection / handling, submission of specimen other than nasopharyngeal swab, presence of viral mutation(s) within the areas targeted by this assay, and inadequate number of viral copies (<250 copies / mL). A negative result must be combined with clinical observations, patient history, and epidemiological information.  Fact Sheet for Patients:   StrictlyIdeas.no  Fact Sheet for Healthcare Providers: BankingDealers.co.za  This test is not yet approved or  cleared by the [place] FDA and has been authorized for detection and/or diagnosis of SARS-CoV-2 by FDA under an Emergency Use Authorization (EUA).  This EUA will remain in effect (meaning this test can be used) for the duration of the COVID-19 declaration under Section 564(b)(1) of the Act, 21 U.S.C. section 360bbb-3(b)(1), unless the authorization is terminated or revoked sooner.  Performed at [facility], [place] [address], [place], [place] [address]   Blood Culture (routine x 2)     Status: Abnormal   Collection Time: 09/06/19  4:21 PM   Specimen: BLOOD  Result Value Ref Range Status   Specimen Description BLOOD LEFT UPPER ARM  Final   Special Requests   Final    BOTTLES DRAWN AEROBIC AND ANAEROBIC Blood Culture adequate volume   Culture  Setup Time   Final    GRAM NEGATIVE RODS IN BOTH AEROBIC AND ANAEROBIC  BOTTLES CRITICAL VALUE NOTED.  VALUE IS CONSISTENT WITH PREVIOUSLY REPORTED AND CALLED VALUE.    Culture (A)  Final    ENTEROBACTER AEROGENES SUSCEPTIBILITIES PERFORMED ON PREVIOUS CULTURE WITHIN THE LAST 5 DAYS. Performed at [facility], [place] [address], [place], [place] [address]    Report Status 09/09/2019 FINAL  Final  Culture, blood (routine x 2)     Status: None (Preliminary result)   Collection Time: 09/09/19  7:40 AM   Specimen: BLOOD  Result Value Ref Range Status   Specimen Description BLOOD BLOOD LEFT HAND  Final   Special Requests   Final    BOTTLES DRAWN AEROBIC AND ANAEROBIC Blood Culture adequate volume   Culture   Final    NO GROWTH 1 DAY Performed at [facility], [place] [address], [place], [place] [address]    Report Status PENDING  Incomplete  Culture, blood (routine x  2)     Status: None (Preliminary result)   Collection Time: 09/09/19  7:40 AM   Specimen: BLOOD  Result Value Ref Range Status   Specimen Description BLOOD LEFT ANTECUBITAL  Final   Special Requests   Final    BOTTLES DRAWN AEROBIC AND ANAEROBIC Blood Culture adequate volume   Culture   Final    NO GROWTH 1 DAY Performed at [facility], [address], [place], [place] [address]    Report Status PENDING  Incomplete         Radiology Studies: MR SHOULDER RIGHT WO CONTRAST  Result Date: 09/09/2019 CLINICAL DATA:  Right shoulder pain, clinical suspicion for septic arthritis. The patient has a reverse shoulder arthroplasty. EXAM: MRI OF THE RIGHT SHOULDER WITHOUT CONTRAST TECHNIQUE: Multiplanar, multisequence MR imaging of the shoulder was performed. No intravenous contrast was administered. COMPARISON:  CT right shoulder 09/06/2019 FINDINGS: The patient was not alert or oriented during imaging and was unable to cooperate with breath holding, resulting and motion artifact. Moreover, there is a reverse shoulder arthroplasty which introduces extensive metal artifact in the region  of interest. Rotator cuff:  Obscured by metal artifact. Muscles: Atrophic supraspinatus which might indirectly imply chronic supraspinatus tendon rupture. Low-grade infiltrative edema in the subscapularis, infraspinatus, and teres minor muscles. Low-grade infiltrative edema tracking within along the deltoid muscle, especially along the superficial fascia margin. Edema tracks along the fascial margins of the teres major muscle. Biceps long head:  Completely obscured by metal artifact. Acromioclavicular Joint: Suspected prior acromioplasty. Questionable prior Mumford procedure. Type II acromion. No well-defined bursitis. Glenohumeral Joint: Completely obscured by metal artifact. No large fluid collection in the joint extending beyond the margins of the metal artifact. Labrum:  N/A Bones: Accentuated edema signal in the humeral shaft just distal to the stem of the humeral component of the prosthesis, for example on image 5/22 and image 29/15. Given that the arthroplasty was only about 2 weeks ago, this degree of residual edema may not be necessarily considered abnormal, but clearly early osteomyelitis is difficult to exclude. I do not observe definite indicators of unusual bony demineralization or fracture in this vicinity on the recent CT from 09/06/2019. Other: No supplemental non-categorized findings. IMPRESSION: 1. Accentuated edema signal in the humeral shaft just distal to the stem of the humeral component of the prosthesis. Given that the arthroplasty was only about 2 weeks ago, this degree of residual edema is not necessarily considered abnormal, but clearly early osteomyelitis is difficult to exclude. 2. Low-grade infiltrative edema in the subscapularis, infraspinatus, and teres minor muscles. 3. Low-grade infiltrative edema tracking within along the superficial fascia margins of the deltoid muscle and along the fascial margins of the teres major muscle. 4. Suspected prior acromioplasty. 5. No obvious effusion  extending beyond the boundaries of the metal artifact, and no definite drainable fluid collection. Regional ultrasound may be helpful in assessing for fluid pockets of might be otherwise obscured by the metal artifact. 6. Markedly atrophic supraspinatus muscle may be an indicator of chronic supraspinatus tear. Electronically Signed   By: Van Clines M.D.   On: 09/09/2019 09:10        Scheduled Meds: . acetaminophen  650 mg Oral Q8H  . atorvastatin  80 mg Oral q1800  . DULoxetine  60 mg Oral Daily  . feeding supplement (PROSource TF)  45 mL Per Tube BID  . ferrous sulfate  325 mg Oral BID WC  . folic acid  1 mg Oral Daily  .  gabapentin  600 mg Oral BID  . insulin aspart  0-9 Units Subcutaneous Q4H  . levETIRAcetam  500 mg Oral BID  . levothyroxine  125 mcg Oral Q0600  . multivitamin with minerals  1 tablet Oral Daily  . topiramate  25 mg Oral BID   Continuous Infusions: . ceFEPime (MAXIPIME) IV 2 g (09/10/19 1229)     LOS: 4 days       Hosie Poisson, MD [facility]   To contact the attending provider between 7A-7P or the covering provider during after hours 7P-7A, please log into the web site [email or web address] and access using universal [place] password for that web site. If you do not have the password, please call the hospital operator.  09/10/2019, 5:18 PM

## 2019-09-10 NOTE — Consult Note (Signed)
Consultation Note Date: 09/10/2019   Patient Name: Samantha Aguirre  DOB: [birth year]  MRN: [id number]  Age / Sex: 74 y.o., female  PCP: Tracie Harrier, MD Referring Physician: Hosie Poisson, MD  Reason for Consultation: Establishing goals of care  HPI/Patient Profile: 74 y.o. female  with past medical history of stroke induced dementia, CVA, seizure, DM, GI bleed, COPD,  CAD, D-HF, who was admitted on 09/06/2019 with altered mental status and fever after an unwitnessed fall.  CXR, U/A and exam did not show an obvious cause of infection.  Blood cultures subsequently demonstrated klebsiella bacteremia.  Transthroacic echocardiogram was negative for valvular vegetation.  Ortho was consulted as the patient had recent right shoulder arthroplasty reversal on 8/12.  Shoulder does not appear to be a source of infection.    Per PT notes patient is a 2+ assist to move from supine to sitting.  Her appetite has been poor.  She is quite confused as I am sitting with her.  It is noon and she believes it is the middle of the night.  She tells me she goes by the name "Rose".  A few minutes later she tells me Kalman Shan was her sister's name and her sister is now deceased.  Clinical Assessment and Goals of Care:  I have reviewed medical records including EPIC notes, labs and imaging, received report from Dr. Karleen Hampshire, examined the patient and spoke on the phone with her daughter and subsequently her daughter, son, and brother  to discuss diagnosis prognosis, [place], EOL wishes, disposition and options.  I introduced Palliative Medicine as specialized medical care for people living with serious illness. It focuses on providing relief from the symptoms and stress of a serious illness.   We discussed a brief life review of the patient. The patient lived with her daughter for 17 years.  4 - 5 years ago she was diagnosed with "stroke induced  dementia".  Her daughter Kiara tells me that the patient was able to get up and around.  She would go outside to smoke - but would have periods where she made no sense.  For example - The patient asked her daughter "What flavor TV do you want?"  At times she has been emotionally labile and disruptive to family members.  She seemed to have a significant decline when she had her reverse arthroplasty and was discharged to [facility].  She seemed to have another significant decline when she returned to the hospital this admission now with bacteremia.  Prior to the family conference call - Neeley (daughter) and I talked at length about her mother's dementia and appropriate next steps.  Her mother needs 24 hour care.  She can no longer be cared for in the home.  She is no longer able to stand.  Yuritza expressed concern that her mother is going to be very angry with her and her brother for placing her in a facility.  I expressed understanding, but encouraged Elainna that she has to make the best decision possible for her mother  as her mother is unable to make her own decisions any longer.  Marden Noble and Nicole Kindred were added to the conversation.  I re-iterated the patient's current condition and information about the trajectory of dementia and the patient's decline.  Doug asked if it was likely that his mother would return to her previous baseline after being treated for bacteremia.  I explained that what we have seen with other patient's indicates that she will not.  Hence - the purpose of my phone call is to try to prepare them for likely next steps and her continued decline in the future.  We talked about Hospice eligibility.  When the patient is no longer interested in eating, when she is no longer able to rehab and walk - it will be near EOL and time for hospice.   [place] (daughter) shared that her Aunt died in a very similar fashion and was cared for by Hospice.  The family expressed understanding.   The were willing to team  together to make the best decisions possible for the patient as there is no HCPOA paper work in place.  Family expressed that they do not want her to return to [facility].  Family has contacts with [facility] and is hopeful she will be accepted there.  Hospice and Palliative Care services outpatient were explained and offered.  Questions and concerns were addressed.  The family was encouraged to call with questions or concerns.    Primary Decision Maker:  NEXT OF KIN Leahanna (daughter) and son Marden Noble)    SUMMARY [place] meeting held with daughter Hindle), son Marden Noble), and brother Nicole Kindred).  Education provided regarding dementia and trajectory.  Family expresses understanding.   Family would like bacteremia aggressively treated   Planned discharge to SNF - family requests [facility] and they have good communication with that facility.   Patient was already DNR.   Discussed visitation with family - brother Nicole Kindred was turned away a few days ago.  Patient is not able to make needs clearly known and is allowed to have a family member with her 24/7.  This family member can rotate out daily.      Delirium precautions added.   Palliative Out Patient to follow at SNF.  Code Status/Advance Care Planning:  DNR   Symptom Management:   Per primary.  SNRI at max dose.  Could schedule tylenol to help with pain relief.  Additional Recommendations (Limitations, Scope, Preferences):  Full Scope Treatment  Palliative Prophylaxis:   Delirium Protocol  Psycho-social/Spiritual:   Desire for further Chaplaincy support:  Patient is Christian  Prognosis: difficult to determine.  She is at high risk for acute decline.  If she declines any further she will certainly be hospice eligible.    Discharge Planning: [place] for rehab with Palliative care service follow-up      Primary Diagnoses: Present on Admission: . AMS (altered mental  status)   I have reviewed the medical record, interviewed the patient and family, and examined the patient. The following aspects are pertinent.  Past Medical History:  Diagnosis Date  . Acid reflux   . Arthritis    Bilateral knees, hands, feet  . Asthma   . Atrial fibrillation ([place])   . Cancer ([place])   . CHF (congestive heart failure) (H[facility])    Diastolic CHF  . COPD (chronic obstructive pulmonary disease) ([place])   . Coronary artery disease   . Diabetes mellitus without complication ([place])   .  Frequent headaches   . GI bleed   . HLD (hyperlipidemia)   . Hypertension   . Seizures ([place])   . Skin cancer 2015   Suspected basal cell carcinoma on nose, surgically removed  . Stroke ([place]) 04/2017  . Thyroid disease   . Tremors of nervous system    Social History   Socioeconomic History  . Marital status: Divorced    Spouse name: Not on file  . Number of children: 2  . Years of education: 72  . Highest education level: 12th grade  Occupational History  . Occupation: Disabled  Tobacco Use  . Smoking status: Current Every Day Smoker    Packs/day: 1.00    Years: 53.00    Pack years: 53.00    Types: Cigarettes  . Smokeless tobacco: Never Used  Vaping Use  . Vaping Use: Former  Substance and Sexual Activity  . Alcohol use: No  . Drug use: No  . Sexual activity: Not Currently  Other Topics Concern  . Not on file  Social History Narrative   None   Social Determinants of Health   Financial Resource Strain:   . Difficulty of Paying Living Expenses: Not on file  Food Insecurity:   . Worried About Charity fundraiser in the Last Year: Not on file  . Ran Out of Food in the Last Year: Not on file  Transportation Needs:   . Lack of Transportation (Medical): Not on file  . Lack of Transportation (Non-Medical): Not on file  Physical Activity:   . Days of Exercise per Week: Not on file  . Minutes of Exercise per Session: Not on file  Stress:   . Feeling of Stress : Not on  file  Social Connections:   . Frequency of Communication with Friends and Family: Not on file  . Frequency of Social Gatherings with Friends and Family: Not on file  . Attends Religious Services: Not on file  . Active Member of Clubs or Organizations: Not on file  . Attends Archivist Meetings: Not on file  . Marital Status: Not on file   Family History  Problem Relation Age of Onset  . Breast cancer Mother   . Heart disease Mother   . Stroke Mother   . Cancer Mother   . COPD Mother   . Diabetes Mother   . Heart disease Father   . Diabetes Father   . Stroke Father   . Alcohol abuse Sister   . Drug abuse Sister   . Stroke Sister   . Cancer Sister   . Mental illness Sister   . Heart disease Brother   . Arthritis Brother   . Diabetes Brother   . Heart disease Maternal Grandfather   . Heart disease Paternal Grandfather     Allergies  Allergen Reactions  . Ivp Dye [Iodinated Diagnostic Agents] Itching    Pt injected with IV contrast.  5 min after injection pt complained of itching behind ear and on her abd.  . Methotrexate Rash  . Morphine And Related Other (See Comments)    "stops my breathing" NEAR-RESPIRATORY ARREST  . Mushroom Extract Complex Anaphylaxis    Made patient "deathly sick"  . Atenolol Other (See Comments)    "MD took me off of it bc it was doing something wrong" Made patient HYPOTENSIVE  . Percocet [Oxycodone-Acetaminophen] Nausea And Vomiting and Other (See Comments)    Stomach pains  . Percodan [Oxycodone-Aspirin] Nausea And Vomiting and Other (See Comments)  Stomach pains  . Tramadol Nausea Only  . Verapamil Other (See Comments)    " MD told me this was screwing me up" MAKES PATIENT HYPOTENSIVE  . Oxycodone Hcl      Vital Signs: BP 132/78 (BP Location: Left Arm)   Pulse 70   Temp 98 F (36.7 C) (Oral)   Resp (!) 25   Ht 5\' 1"  (1.549 m)   Wt 76.2 kg   SpO2 92%   BMI 31.74 kg/m  Pain Scale: 0-10   Pain Score: 0-No  pain   SpO2: SpO2: 92 % O2 Device:SpO2: 92 % O2 Flow Rate: .     Palliative Assessment/Data: 30%     Time In: 11:30 Time Out: 12:45 Time Total: 75 min. Visit consisted of counseling and education dealing with the complex and emotionally intense issues surrounding the need for palliative care and symptom management in the setting of serious and potentially life-threatening illness. Greater than 50%  of this time was spent counseling and coordinating care related to the above assessment and plan.  Signed by: Florentina Jenny, PA-C Palliative Medicine  Please contact Palliative Medicine Team phone at [phone] for questions and concerns.  For individual provider: See Shea Evans

## 2019-09-11 LAB — CBC WITH DIFFERENTIAL/PLATELET
Abs Immature Granulocytes: 0.04 10*3/uL (ref 0.00–0.07)
Basophils Absolute: 0 10*3/uL (ref 0.0–0.1)
Basophils Relative: 1 %
Eosinophils Absolute: 0.1 10*3/uL (ref 0.0–0.5)
Eosinophils Relative: 2 %
HCT: 28 % — ABNORMAL LOW (ref 36.0–46.0)
Hemoglobin: 8.9 g/dL — ABNORMAL LOW (ref 12.0–15.0)
Immature Granulocytes: 1 %
Lymphocytes Relative: 21 %
Lymphs Abs: 1 10*3/uL (ref 0.7–4.0)
MCH: 28.3 pg (ref 26.0–34.0)
MCHC: 31.8 g/dL (ref 30.0–36.0)
MCV: 88.9 fL (ref 80.0–100.0)
Monocytes Absolute: 0.6 10*3/uL (ref 0.1–1.0)
Monocytes Relative: 11 %
Neutro Abs: 3.3 10*3/uL (ref 1.7–7.7)
Neutrophils Relative %: 64 %
Platelets: 215 10*3/uL (ref 150–400)
RBC: 3.15 MIL/uL — ABNORMAL LOW (ref 3.87–5.11)
RDW: 14.5 % (ref 11.5–15.5)
WBC: 5.1 10*3/uL (ref 4.0–10.5)
nRBC: 0 % (ref 0.0–0.2)

## 2019-09-11 LAB — COMPREHENSIVE METABOLIC PANEL
ALT: 30 U/L (ref 0–44)
AST: 36 U/L (ref 15–41)
Albumin: 2.1 g/dL — ABNORMAL LOW (ref 3.5–5.0)
Alkaline Phosphatase: 127 U/L — ABNORMAL HIGH (ref 38–126)
Anion gap: 9 (ref 5–15)
BUN: 10 mg/dL (ref 8–23)
CO2: 22 mmol/L (ref 22–32)
Calcium: 8.5 mg/dL — ABNORMAL LOW (ref 8.9–10.3)
Chloride: 109 mmol/L (ref 98–111)
Creatinine, Ser: 0.49 mg/dL (ref 0.44–1.00)
GFR calc Af Amer: >60 mL/min (ref >60)
GFR calc non Af Amer: >60 mL/min (ref >60)
Glucose, Bld: 135 mg/dL — ABNORMAL HIGH (ref 70–99)
Potassium: 3.1 mmol/L — ABNORMAL LOW (ref 3.5–5.1)
Sodium: 140 mmol/L (ref 135–145)
Total Bilirubin: 0.6 mg/dL (ref 0.3–1.2)
Total Protein: 5.4 g/dL — ABNORMAL LOW (ref 6.5–8.1)

## 2019-09-11 LAB — GLUCOSE, CAPILLARY
Glucose-Capillary: 114 mg/dL — ABNORMAL HIGH (ref 70–99)
Glucose-Capillary: 121 mg/dL — ABNORMAL HIGH (ref 70–99)
Glucose-Capillary: 149 mg/dL — ABNORMAL HIGH (ref 70–99)
Glucose-Capillary: 164 mg/dL — ABNORMAL HIGH (ref 70–99)
Glucose-Capillary: 240 mg/dL — ABNORMAL HIGH (ref 70–99)
Glucose-Capillary: 97 mg/dL (ref 70–99)

## 2019-09-11 LAB — MAGNESIUM: Magnesium: 2.1 mg/dL (ref 1.7–2.4)

## 2019-09-11 MED ORDER — ASPIRIN 81 MG PO CHEW
81.0000 mg | CHEWABLE_TABLET | Freq: Every day | ORAL | Status: DC
  Administered 2019-09-11 – 2019-09-12 (×2): 81 mg via ORAL
  Filled 2019-09-11 (×2): qty 1

## 2019-09-11 MED ORDER — CLOPIDOGREL BISULFATE 75 MG PO TABS
75.0000 mg | ORAL_TABLET | Freq: Every day | ORAL | Status: DC
  Administered 2019-09-11 – 2019-09-12 (×2): 75 mg via ORAL
  Filled 2019-09-11 (×2): qty 1

## 2019-09-11 MED ORDER — POTASSIUM CHLORIDE CRYS ER 20 MEQ PO TBCR
40.0000 meq | EXTENDED_RELEASE_TABLET | Freq: Once | ORAL | Status: AC
  Administered 2019-09-11: 40 meq via ORAL
  Filled 2019-09-11: qty 2

## 2019-09-11 NOTE — Progress Notes (Signed)
ID PROGRESS NOTE   Remains afebrile.  Blood cx remain negative  A/P: enterobacter bacteremia of unclear source, TTE negative, shoulder MRI not c/w new infection.  Plan to treat for 14 days using 8/27 as day 1  Will have her follow up with Dr West Bali in 2 wks  Diagnosis: bacteremia  Culture Result: enterobacter  Allergies  Allergen Reactions  . Ivp Dye [Iodinated Diagnostic Agents] Itching    Pt injected with IV contrast.  5 min after injection pt complained of itching behind ear and on her abd.  . Methotrexate Rash  . Morphine And Related Other (See Comments)    "stops my breathing" NEAR-RESPIRATORY ARREST  . Mushroom Extract Complex Anaphylaxis    Made patient "deathly sick"  . Atenolol Other (See Comments)    "MD took me off of it bc it was doing something wrong" Made patient HYPOTENSIVE  . Percocet [Oxycodone-Acetaminophen] Nausea And Vomiting and Other (See Comments)    Stomach pains  . Percodan [Oxycodone-Aspirin] Nausea And Vomiting and Other (See Comments)    Stomach pains  . Tramadol Nausea Only  . Verapamil Other (See Comments)    " MD told me this was screwing me up" MAKES PATIENT HYPOTENSIVE  . Oxycodone Hcl     OPAT Orders Discharge antibiotics to be given via PICC line Discharge antibiotics: Per pharmacy protocol  cefepime Aim for Vancomycin trough 15-20 or AUC 400-550 (unless otherwise indicated) Duration: 2 wk End Date: 09/22/2019  [facility] Care Per Protocol:  Home health RN for IV administration and teaching; PICC line care and labs.    Labs weekly while on IV antibiotics: _x_ CBC with differential _x_ BMP  _x_ Please pull PIC at completion of IV antibiotics __ Please leave PIC in place until doctor has seen patient or been notified  Fax weekly labs to [phone]  Clinic Follow Up Appt: In 2 wk with dr West Bali   @ [facility]

## 2019-09-11 NOTE — Progress Notes (Signed)
PROGRESS NOTE    Samantha Aguirre  JME:[id number] DOB: 1945-07-13 DOA: 09/06/2019 PCP: Tracie Harrier, MD    Chief Complaint  Patient presents with  . Altered Mental Status  . Fall    Brief Narrative: 74 year old lady prior history of paroxysmal atrial fibrillation, chronic diastolic heart failure, COPD, asthma, noninsulin-dependent type 2 diabetes mellitus, essential hypertension, hyperlipidemia, seizure disorder, history of CVA in the past, hypothyroidism, coronary artery disease, GERD, recent right shoulder arthroplasty on 08/25/2019 presents to ED from [facility] for evaluation of fever and altered mental status.  History of unwitnessed fall on Saturday.  Patient is confused and history was not available on admission.  On arrival to ED she was febrile tachypneic, had leukocytosis.  Blood cultures grew Klebsiella aerogenes in all the bottles. Chest x-ray does not show any pneumonia urine analysis is negative, CT of the head is negative for acute intracranial abnormality.  CT of the abdomen pelvis does not show any acute infectious source.  X-rays of the knee negative for acute fracture or malalignment. She was initially started on IV vancomycin, ampicillin and Rocephin for evaluation of meningitis, later on transitioned to IV cefepime for Klebsiella bacteremia.  Patient seen and examined at bedside she is alert oriented to person and place this morning.  Was able to answer simple questions and following commands.  PT evaluation recommending SNF on discharge.  Meanwhile ID consulted for evaluation of Enterobacter bacteremia.  Echocardiogram ordered for evaluation of heart valves.  Ultrasound of the right shoulder ordered to see if she has any signs of septic joint. PT evaluation recommending SNF.    Assessment & Plan:   Principal Problem:   AMS (altered mental status) Active Problems:   Acute on chronic anemia   Fever   SIRS (systemic inflammatory response syndrome) (HCC)    Hypokalemia   Bacteremia   Sepsis with encephalopathy without septic shock ([place])   Fall at nursing home   Vascular dementia without behavioral disturbance ([place])  Sepsis with Enterobacter aerogenes bacteremia.   Patient was empirically started on IV vancomycin, Rocephin and ampicillin later on transitioned to IV cefepime.   ID consulted, recommended 14 days of I V cefepime . Repeat cultures are negative . PICC line ordered and pending..  Unclear source as her urine analysis is negative for infection.  CT of the abdomen and pelvis does not show any acute intra-abdominal pathology. MRI of the shoulder shows Accentuated edema signal in the humeral shaft just distal to the stem of the humeral component of the prosthesis. Given that the arthroplasty was only about 2 weeks ago, this degree of residual edema is not necessarily considered abnormal, but clearly early osteomyelitis is difficult to exclude. Discussed with ortho, suggested that right shoulder is probably not the source of infection. Since we do not know the source of infection, will plan for 2 weeks of treatment as per ID .    Acute metabolic encephalopathy Probably secondary to sepsis vs from being off keppra. She was also found to have elevated ammonia levels with mildly elevated liver enzymes and elevated alkaline phosphatase.  Repeat liver enzymes wnl.  Pt is alert and oriented to place and person only today.  She does not appear to be in distress.    Seizures :  Pt has been off keppra for the last two weeks of unclear etiology.  Restarted the keppra and topamax.   Anemia of chronic disease and anemia of blood loss probably from right shoulder arthroplasty. Baseline hemoglobin appears to be  around 12 dropped to 11 post surgery and currently at 8.9.  L0W iron levels and low folate levels on anemia panel.  Replace iron and folate. Repeat labs in am.  Transfuse to keep hemoglobin greater than 7.     Hypokalemia Replaced.     Essential hypertension:  Well controlled.    Type 2 DM:  CBG (last 3)  Recent Labs    09/11/19 0332 09/11/19 0731 09/11/19 1128  GLUCAP 121* 114* 164*   Resume sliding scale insulin. Hemoglobin A1c is 6.3% No changes to medications.    Copd:  No wheezing heard.     Chronic diastolic heart failure Last echocardiogram from October 2020 shows left ventricular ejection fraction of 55 to 60% with normal function.  She appears compensated.  Lasix on hold for now.     Hypothyroidism Continue with Synthroid 125 MCG daily.   Hyperlipidemia Continue with Lipitor 80 mg daily.    History of coronary artery disease, CVA in the past. LP cancelled. Aspirin and plavix resumed by ppharmacy.    Mild metabolic acidosis, non-anion gap Resolved.    Hypokalemia:  Replaced.    Nutrition:  SLP evalution recommending dysphagia 1 diet / Pureed diet with thin liquid.  Liquids provided via: Cup;Straw Medication Administration: Crushed with puree Compensations: Minimize environmental distractions;Slow rate   DVT prophylaxis: SCDs Code Status: DNR Family Communication: None at bedside discussed with daughter over the phone on 09/09/19.  Disposition:   Status is: Inpatient  Remains inpatient appropriate because:Unsafe d/c plan and IV treatments appropriate due to intensity of illness or inability to take PO   Dispo: The patient is from: Home              Anticipated d/c is to: SNF              Anticipated d/c date is: 1 day              Patient currently is not medically stable to d/c.       Consultants:   Infectious disease  Procedures: None Antimicrobials: ( Anti-infectives (From admission, onward)   Start     Dose/Rate Route Frequency Ordered Stop   09/07/19 1200  ceFEPIme (MAXIPIME) 2 g in sodium chloride 0.9 % 100 mL IVPB        2 g 200 mL/hr over 30 Minutes Intravenous Every 8 hours 09/07/19 1058     09/07/19 0800  vancomycin (VANCOREADY) IVPB 750  mg/150 mL  Status:  Discontinued        750 mg 150 mL/hr over 60 Minutes Intravenous Every 12 hours 09/06/19 1930 09/07/19 1100   09/07/19 0600  cefTRIAXone (ROCEPHIN) 2 g in sodium chloride 0.9 % 100 mL IVPB  Status:  Discontinued        2 g 200 mL/hr over 30 Minutes Intravenous Every 12 hours 09/06/19 2018 09/07/19 1045   09/06/19 2030  cefTRIAXone (ROCEPHIN) 1 g in sodium chloride 0.9 % 100 mL IVPB        1 g 200 mL/hr over 30 Minutes Intravenous  Once 09/06/19 2018 09/07/19 0132   09/06/19 2030  ampicillin (OMNIPEN) 2 g in sodium chloride 0.9 % 100 mL IVPB  Status:  Discontinued        2 g 300 mL/hr over 20 Minutes Intravenous Every 4 hours 09/06/19 2018 09/07/19 1100   09/06/19 2000  vancomycin (VANCOREADY) IVPB 1750 mg/350 mL        1,750 mg 175 mL/hr over 120 Minutes Intravenous  Once 09/06/19 1930 09/06/19 2238   09/06/19 1600  cefTRIAXone (ROCEPHIN) 1 g in sodium chloride 0.9 % 100 mL IVPB  Status:  Discontinued        1 g 200 mL/hr over 30 Minutes Intravenous Every 24 hours 09/06/19 1549 09/06/19 2018       Subjective: Pt alert and denies any  New complaints.   Objective: Vitals:   09/10/19 1939 09/10/19 2350 09/11/19 0330 09/11/19 1216  BP: 130/62 113/78 (!) 126/54 115/63  Pulse: (!) 56 68 (!) 54 61  Resp: 14 18 15 17   Temp: 97.8 F (36.6 C) 98.2 F (36.8 C) 98.8 F (37.1 C) 97.8 F (36.6 C)  TempSrc: Oral   Axillary  SpO2: 100% 99%    Weight:      Height:        Intake/Output Summary (Last 24 hours) at 09/11/2019 1231 Last data filed at 09/11/2019 6222 Gross per 24 hour  Intake 710 ml  Output 400 ml  Net 310 ml   Filed Weights   09/06/19 1523  Weight: 76.2 kg    Examination:  General exam: Alert, not in any kind of distress. Respiratory system: Clear to auscultation bilaterally, no wheezing or rhonchi..  Cardiovascular system: S1-S2 heard, regular rate rhythm, no JVD, no pedal edema Gastrointestinal system: Abdomen is soft, nontender, nondistended,  bowel sounds normal Central nervous system: Patient is alert and oriented to place and person, grossly nonfocal. Extremities: No pedal edema, cyanosis Skin: No rashes seen Psychiatry: Mood is appropriate  Data Reviewed: I have personally reviewed following labs and imaging studies  CBC: Recent Labs  Lab 09/06/19 1617 09/08/19 1356 09/09/19 0502 09/10/19 0143 09/11/19 0132  WBC 13.0* 4.6 3.9* 4.1 5.1  NEUTROABS 11.8* 4.0 3.0 2.7 3.3  HGB 9.9* 10.5* 8.9* 9.2* 8.9*  HCT 31.5* 31.9* 27.7* 28.6* 28.0*  MCV 92.6 90.1 88.8 88.3 88.9  PLT 283 202 183 210 979    Basic Metabolic Panel: Recent Labs  Lab 09/06/19 1617 09/06/19 2035 09/07/19 0557 09/08/19 0956 09/08/19 1356 09/09/19 0502 09/10/19 0143 09/11/19 0132  NA   < >  --  138 136  --  136 139 140  K   < >  --  3.4* 2.9*  --  3.3* 3.7 3.1*  CL   < >  --  107 106  --  108 109 109  CO2   < >  --  19* 19*  --  19* 20* 22  GLUCOSE   < >  --  134* 116*  --  146* 109* 135*  BUN   < >  --  14 15  --  15 12 10   CREATININE   < >  --  0.60 0.62  --  0.50 0.52 0.49  CALCIUM   < >  --  8.2* 8.3*  --  8.0* 8.5* 8.5*  MG  --  1.7  --   --  1.9 2.0 2.0 2.1   < > = values in this interval not displayed.    GFR: Estimated Creatinine Clearance: 56.8 mL/min (by C-G formula based on SCr of 0.49 mg/dL).  Liver Function Tests: Recent Labs  Lab 09/06/19 1617 09/07/19 0557 09/09/19 0502 09/10/19 0143 09/11/19 0132  AST 51* 49* 31 43* 36  ALT 46* 44 31 35 30  ALKPHOS 167* 148* 127* 124 127*  BILITOT 1.4* 0.7 0.4 0.6 0.6  PROT 5.8* 5.2* 5.2* 5.4* 5.4*  ALBUMIN 2.6* 2.3* 1.9* 2.0* 2.1*  CBG: Recent Labs  Lab 09/10/19 1937 09/10/19 2346 09/11/19 0332 09/11/19 0731 09/11/19 1128  GLUCAP 112* 100* 121* 114* 164*     Recent Results (from the past 240 hour(s))  Blood Culture (routine x 2)     Status: Abnormal   Collection Time: 09/06/19  4:17 PM   Specimen: BLOOD LEFT FOREARM  Result Value Ref Range Status   Specimen  Description BLOOD LEFT FOREARM  Final   Special Requests   Final    BOTTLES DRAWN AEROBIC AND ANAEROBIC Blood Culture adequate volume   Culture  Setup Time   Final    GRAM NEGATIVE RODS IN BOTH AEROBIC AND ANAEROBIC BOTTLES Organism ID to follow CRITICAL RESULT CALLED TO, READ BACK BY AND VERIFIED WITH: Molly Maduro PharmD 9:10 09/07/19 ([email or web address]) Performed at [facility], [place] [address], [place], [place] [address]    Culture ENTEROBACTER AEROGENES (A)  Final   Report Status 09/09/2019 FINAL  Final   Organism ID, Bacteria ENTEROBACTER AEROGENES  Final      Susceptibility   Enterobacter aerogenes - MIC*    CEFAZOLIN >=64 RESISTANT Resistant     CEFEPIME <=0.12 SENSITIVE Sensitive     CEFTAZIDIME <=1 SENSITIVE Sensitive     CEFTRIAXONE <=0.25 SENSITIVE Sensitive     CIPROFLOXACIN <=0.25 SENSITIVE Sensitive     GENTAMICIN <=1 SENSITIVE Sensitive     IMIPENEM 2 SENSITIVE Sensitive     TRIMETH/SULFA <=20 SENSITIVE Sensitive     PIP/TAZO <=4 SENSITIVE Sensitive     * ENTEROBACTER AEROGENES  Blood Culture ID Panel (Reflexed)     Status: Abnormal   Collection Time: 09/06/19  4:17 PM  Result Value Ref Range Status   Enterococcus faecalis NOT DETECTED NOT DETECTED Final   Enterococcus Faecium NOT DETECTED NOT DETECTED Final   Listeria monocytogenes NOT DETECTED NOT DETECTED Final   Staphylococcus species NOT DETECTED NOT DETECTED Final   Staphylococcus aureus (BCID) NOT DETECTED NOT DETECTED Final   Staphylococcus epidermidis NOT DETECTED NOT DETECTED Final   Staphylococcus lugdunensis NOT DETECTED NOT DETECTED Final   Streptococcus species NOT DETECTED NOT DETECTED Final   Streptococcus agalactiae NOT DETECTED NOT DETECTED Final   Streptococcus pneumoniae NOT DETECTED NOT DETECTED Final   Streptococcus pyogenes NOT DETECTED NOT DETECTED Final   A.calcoaceticus-baumannii NOT DETECTED NOT DETECTED Final   Bacteroides fragilis NOT DETECTED NOT DETECTED Final   Enterobacterales DETECTED  (A) NOT DETECTED Final    Comment: Enterobacterales represent a large order of gram negative bacteria, not a single organism. CRITICAL RESULT CALLED TO, READ BACK BY AND VERIFIED WITH: Molly Maduro PharmD 9:10 09/07/19 ([email or web address])    Enterobacter cloacae complex NOT DETECTED NOT DETECTED Final   Escherichia coli NOT DETECTED NOT DETECTED Final   Klebsiella aerogenes DETECTED (A) NOT DETECTED Final    Comment: CRITICAL RESULT CALLED TO, READ BACK BY AND VERIFIED WITH: Molly Maduro PharmD 9:10 09/07/19 ([email or web address])    Klebsiella oxytoca NOT DETECTED NOT DETECTED Final   Klebsiella pneumoniae NOT DETECTED NOT DETECTED Final   Proteus species NOT DETECTED NOT DETECTED Final   Salmonella species NOT DETECTED NOT DETECTED Final   Serratia marcescens NOT DETECTED NOT DETECTED Final   Haemophilus influenzae NOT DETECTED NOT DETECTED Final   Neisseria meningitidis NOT DETECTED NOT DETECTED Final   Pseudomonas aeruginosa NOT DETECTED NOT DETECTED Final   Stenotrophomonas maltophilia NOT DETECTED NOT DETECTED Final   Candida albicans NOT DETECTED NOT DETECTED Final   Candida auris NOT DETECTED NOT DETECTED Final  Candida glabrata NOT DETECTED NOT DETECTED Final   Candida krusei NOT DETECTED NOT DETECTED Final   Candida parapsilosis NOT DETECTED NOT DETECTED Final   Candida tropicalis NOT DETECTED NOT DETECTED Final   Cryptococcus neoformans/gattii NOT DETECTED NOT DETECTED Final   CTX-M ESBL NOT DETECTED NOT DETECTED Final   Carbapenem resistance IMP NOT DETECTED NOT DETECTED Final   Carbapenem resistance KPC NOT DETECTED NOT DETECTED Final   Carbapenem resistance NDM NOT DETECTED NOT DETECTED Final   Carbapenem resist OXA 48 LIKE NOT DETECTED NOT DETECTED Final   Carbapenem resistance VIM NOT DETECTED NOT DETECTED Final    Comment: Performed at [facility], [place] [address] [place], [place] [address]  Urine culture     Status: None   Collection Time: 09/06/19  4:18 PM   Specimen: In/Out Cath Urine    Result Value Ref Range Status   Specimen Description IN/OUT CATH URINE  Final   Special Requests NONE  Final   Culture   Final    NO GROWTH Performed at [facility], [place] [address] [place], [place] [address]    Report Status 09/07/2019 FINAL  Final  SARS Coronavirus 2 by RT PCR (hospital order, performed in [facility] hospital lab) Nasopharyngeal Urine, Catheterized     Status: None   Collection Time: 09/06/19  4:18 PM   Specimen: Urine, Catheterized; Nasopharyngeal  Result Value Ref Range Status   SARS Coronavirus 2 NEGATIVE NEGATIVE Final    Comment: (NOTE) SARS-CoV-2 target nucleic acids are NOT DETECTED.  The SARS-CoV-2 RNA is generally detectable in upper and lower respiratory specimens during the acute phase of infection. The lowest concentration of SARS-CoV-2 viral copies this assay can detect is 250 copies / mL. A negative result does not preclude SARS-CoV-2 infection and should not be used as the sole basis for treatment or other patient management decisions.  A negative result may occur with improper specimen collection / handling, submission of specimen other than nasopharyngeal swab, presence of viral mutation(s) within the areas targeted by this assay, and inadequate number of viral copies (<250 copies / mL). A negative result must be combined with clinical observations, patient history, and epidemiological information.  Fact Sheet for Patients:   StrictlyIdeas.no  Fact Sheet for Healthcare Providers: BankingDealers.co.za  This test is not yet approved or  cleared by the [place] FDA and has been authorized for detection and/or diagnosis of SARS-CoV-2 by FDA under an Emergency Use Authorization (EUA).  This EUA will remain in effect (meaning this test can be used) for the duration of the COVID-19 declaration under Section 564(b)(1) of the Act, 21 U.S.C. section 360bbb-3(b)(1), unless the authorization is  terminated or revoked sooner.  Performed at [facility], [place] [address] [place], [place] [address]   Blood Culture (routine x 2)     Status: Abnormal   Collection Time: 09/06/19  4:21 PM   Specimen: BLOOD  Result Value Ref Range Status   Specimen Description BLOOD LEFT UPPER ARM  Final   Special Requests   Final    BOTTLES DRAWN AEROBIC AND ANAEROBIC Blood Culture adequate volume   Culture  Setup Time   Final    GRAM NEGATIVE RODS IN BOTH AEROBIC AND ANAEROBIC BOTTLES CRITICAL VALUE NOTED.  VALUE IS CONSISTENT WITH PREVIOUSLY REPORTED AND CALLED VALUE.    Culture (A)  Final    ENTEROBACTER AEROGENES SUSCEPTIBILITIES PERFORMED ON PREVIOUS CULTURE WITHIN THE LAST 5 DAYS. Performed at [facility], [address], [place], [place] [address]    Report Status 09/09/2019 FINAL  Final  Culture, blood (routine x 2)     Status: None (Preliminary result)   Collection Time: 09/09/19  7:40 AM   Specimen: BLOOD  Result Value Ref Range Status   Specimen Description BLOOD BLOOD LEFT HAND  Final   Special Requests   Final    BOTTLES DRAWN AEROBIC AND ANAEROBIC Blood Culture adequate volume   Culture   Final    NO GROWTH 1 DAY Performed at [facility], [place] [address] [place], [place] [address]    Report Status PENDING  Incomplete  Culture, blood (routine x 2)     Status: None (Preliminary result)   Collection Time: 09/09/19  7:40 AM   Specimen: BLOOD  Result Value Ref Range Status   Specimen Description BLOOD LEFT ANTECUBITAL  Final   Special Requests   Final    BOTTLES DRAWN AEROBIC AND ANAEROBIC Blood Culture adequate volume   Culture   Final    NO GROWTH 1 DAY Performed at [facility], [place] [address], [place], [place] [address]    Report Status PENDING  Incomplete         Radiology Studies: [place] EKG SITE RITE  Result Date: 09/10/2019 If Site Rite image not attached, placement could not be confirmed due to current cardiac  rhythm.       Scheduled Meds: . acetaminophen  650 mg Oral Q8H  . aspirin  81 mg Oral Daily  . atorvastatin  80 mg Oral q1800  . clopidogrel  75 mg Oral Daily  . DULoxetine  60 mg Oral Daily  . feeding supplement (PROSource TF)  45 mL Per Tube BID  . ferrous sulfate  325 mg Oral BID WC  . folic acid  1 mg Oral Daily  . gabapentin  600 mg Oral BID  . insulin aspart  0-9 Units Subcutaneous Q4H  . levETIRAcetam  500 mg Oral BID  . levothyroxine  125 mcg Oral Q0600  . multivitamin with minerals  1 tablet Oral Daily  . topiramate  25 mg Oral BID   Continuous Infusions: . ceFEPime (MAXIPIME) IV 2 g (09/11/19 1125)     LOS: 5 days       Hosie Poisson, MD [facility]   To contact the attending provider between 7A-7P or the covering provider during after hours 7P-7A, please log into the web site [email or web address] and access using universal [place] password for that web site. If you do not have the password, please call the hospital operator.  09/11/2019, 12:31 PM

## 2019-09-11 NOTE — Progress Notes (Signed)
K+ 3.1 this AM. Ok to give KCL 75meq x1 and resume her plavix and ASA per Dr. Karleen Hampshire.  Onnie Boer, PharmD, BCIDP, AAHIVP, CPP Infectious Disease Pharmacist 09/11/2019 8:37 AM

## 2019-09-12 LAB — BASIC METABOLIC PANEL
Anion gap: 8 (ref 5–15)
BUN: 12 mg/dL (ref 8–23)
CO2: 23 mmol/L (ref 22–32)
Calcium: 8.3 mg/dL — ABNORMAL LOW (ref 8.9–10.3)
Chloride: 112 mmol/L — ABNORMAL HIGH (ref 98–111)
Creatinine, Ser: 0.58 mg/dL (ref 0.44–1.00)
GFR calc Af Amer: >60 mL/min (ref >60)
GFR calc non Af Amer: >60 mL/min (ref >60)
Glucose, Bld: 96 mg/dL (ref 70–99)
Potassium: 3.4 mmol/L — ABNORMAL LOW (ref 3.5–5.1)
Sodium: 143 mmol/L (ref 135–145)

## 2019-09-12 LAB — CBC WITH DIFFERENTIAL/PLATELET
Abs Immature Granulocytes: 0.08 10*3/uL — ABNORMAL HIGH (ref 0.00–0.07)
Basophils Absolute: 0.1 10*3/uL (ref 0.0–0.1)
Basophils Relative: 1 %
Eosinophils Absolute: 0.2 10*3/uL (ref 0.0–0.5)
Eosinophils Relative: 2 %
HCT: 29.2 % — ABNORMAL LOW (ref 36.0–46.0)
Hemoglobin: 9.3 g/dL — ABNORMAL LOW (ref 12.0–15.0)
Immature Granulocytes: 1 %
Lymphocytes Relative: 25 %
Lymphs Abs: 1.7 10*3/uL (ref 0.7–4.0)
MCH: 29.2 pg (ref 26.0–34.0)
MCHC: 31.8 g/dL (ref 30.0–36.0)
MCV: 91.5 fL (ref 80.0–100.0)
Monocytes Absolute: 0.6 10*3/uL (ref 0.1–1.0)
Monocytes Relative: 9 %
Neutro Abs: 4.1 10*3/uL (ref 1.7–7.7)
Neutrophils Relative %: 62 %
Platelets: 272 10*3/uL (ref 150–400)
RBC: 3.19 MIL/uL — ABNORMAL LOW (ref 3.87–5.11)
RDW: 14.6 % (ref 11.5–15.5)
WBC: 6.6 10*3/uL (ref 4.0–10.5)
nRBC: 0 % (ref 0.0–0.2)

## 2019-09-12 LAB — SARS CORONAVIRUS 2 BY RT PCR (HOSPITAL ORDER, PERFORMED IN ~~LOC~~ HOSPITAL LAB): SARS Coronavirus 2: NEGATIVE

## 2019-09-12 LAB — GLUCOSE, CAPILLARY
Glucose-Capillary: 127 mg/dL — ABNORMAL HIGH (ref 70–99)
Glucose-Capillary: 194 mg/dL — ABNORMAL HIGH (ref 70–99)
Glucose-Capillary: 148 mg/dL — ABNORMAL HIGH (ref 70–99)
Glucose-Capillary: 156 mg/dL — ABNORMAL HIGH (ref 70–99)

## 2019-09-12 MED ORDER — FOLIC ACID 1 MG PO TABS: 1.0000 mg | ORAL_TABLET | Freq: Every day | ORAL | Status: AC

## 2019-09-12 MED ORDER — CEFEPIME IV (FOR PTA / DISCHARGE USE ONLY): 2.0000 g | Freq: Three times a day (TID) | INTRAVENOUS | 0 refills | Status: AC

## 2019-09-12 NOTE — Progress Notes (Signed)
Report called to [facility] and given to [place], [place]. All Personal belongings collected from the room and sent with patient.

## 2019-09-12 NOTE — TOC Transition Note (Signed)
Transition of Care [facility]) - CM/SW Discharge Note   Patient Details  Name: Samantha Aguirre MRN: [id number] Date of Birth: Aug 26, 1945  Transition of [facility]) CM/SW Contact:  Geralynn Ochs, LCSW Phone Number: 09/12/2019, 3:44 PM   Clinical Narrative:   Nurse to call report to [phone], Room 219; Ask for Leata Mouse    Final next level of care: [place] Barriers to Discharge: Barriers Resolved   Patient Goals and CMS Choice Patient states their goals for this hospitalization and ongoing recovery are:: patient unable to participate in goal setting due to disorientation CMS Medicare.gov Compare Post Acute Care list provided to:: Patient Represenative (must comment) Choice offered to / list presented to : Adult Children  Discharge Placement              Patient chooses bed at: [facility] Patient to be transferred to facility by: [place] Name of family member notified: Shannel Patient and family notified of of transfer: 09/12/19  Discharge Plan and Services     Post Acute Care Choice: [place]                               Social Determinants of Health (SDOH) Interventions     Readmission Risk Interventions Readmission Risk Prevention Plan 10/16/2018 06/15/2018  Transportation Screening Complete Complete  PCP or Specialist Appt within 5-7 Days Complete -  PCP or Specialist Appt within 3-5 Days - Complete  Home Care Screening Complete -  Medication Review (RN CM) Complete -  HRI or [place] - Complete  Social Work Consult for [place] Planning/Counseling - Not Complete  SW consult not completed comments - na  Palliative Care Screening - Not Applicable  Medication Review Press photographer) - Complete  Some recent data might be hidden

## 2019-09-12 NOTE — Progress Notes (Signed)
Hydrologist [facility])  Hospital Liaison: RN note         Notified by [facility] manager of patient/family request for [facility] Palliative services at [facility] after discharge.               [place] Palliative team will follow up with patient after discharge at [facility]        Please call with any hospice or palliative related questions.         Thank you for this referral.         Farrel Gordon, RN, CCM  [place] (listed on [place] under Hospice[facility])    [phone]

## 2019-09-12 NOTE — Progress Notes (Signed)
Physical Therapy Treatment Patient Details Name: Samantha Aguirre MRN: [id number] DOB: 1945-07-10 Today's Date: 09/12/2019    History of Present Illness Samantha Aguirre is a 74 y.o. female with medical history significant of paroxysmal A. fib, chronic diastolic CHF, COPD, asthma, CAD, noninsulin-dependent type II diabetes, hypertension, hyperlipidemia, seizure disorder, CVA, hypothyroidism, GERD, recent reverse right total shoulder arthroplasty on 08/25/2019 presenting to the ED via EMS from [facility] for evaluation of fever and altered mental status.  Facility reported to EMS that patient was last known well on Friday.  It is reported that she had an unwitnessed fall on Saturday.  She had an abrasion noted to her knee, unknown head injury.    PT Comments    Pt progressing towards physical therapy goals. Pt pleasantly confused and with several instances where pt appeared paranoid. Perseverated on the sling/abduction pillow positioning, with extensive education provided Re: need to keep on even when in bed. RN asking for therapy to return pt to bed, reporting periods of agitation making her a fall risk if up in the chair. Pt left with bed in chair position, alarm set and all needs met. Will continue to follow and progress as able per POC.    Follow Up Recommendations  SNF;Supervision/Assistance - 24 hour     Equipment Recommendations  None recommended by PT    Recommendations for Other Services       Precautions / Restrictions Precautions Precautions: Fall Type of Shoulder Precautions: rev total shoulder 08/25/19 Shoulder Interventions: Shoulder sling/immobilizer;Shoulder abduction pillow;At all times;Off for dressing/bathing/exercises Required Braces or Orthoses: Sling Restrictions Weight Bearing Restrictions: No RUE Weight Bearing: Non weight bearing    Mobility  Bed Mobility Overal bed mobility: Needs Assistance Bed Mobility: Supine to Sit;Sit to Supine     Supine to sit:  Mod assist;+2 for physical assistance;HOB elevated Sit to supine: Mod assist;+2 for physical assistance   General bed mobility comments: Assist for trunk elevation to full sitting position, and LE elevation/trunk control back to supine at end of session. +2 helpful for initiation and also pt very inpulsive.   Transfers Overall transfer level: Modified independent Equipment used: 2 person hand held assist Transfers: Sit to/from [facility] Sit to Stand: Min assist Stand pivot transfers: Min assist;+2 safety/equipment       General transfer comment: SPT to/from [facility]. VC's for sequencing and general safety awareness. Assist for balance support and controlled descent to sitting.   Ambulation/Gait             General Gait Details: Deferred for safety as pt impulsive and trying to take sling off   Stairs             Wheelchair Mobility    Modified Rankin (Stroke Patients Only)       Balance Overall balance assessment: Needs assistance Sitting-balance support: No upper extremity supported;Feet supported Sitting balance-Leahy Scale: Poor Sitting balance - Comments: patient is able to sit at edge of bed once assisted there and with min guard to prevent lob. Postural control: Posterior lean Standing balance support: During functional activity Standing balance-Leahy Scale: Poor Standing balance comment: reliant on UE support                             Cognition Arousal/Alertness: Awake/alert Behavior During Therapy: Restless;Impulsive Overall Cognitive Status: Impaired/Different from baseline Area of Impairment: Orientation;Attention;Memory;Following commands;Awareness;Safety/judgement;Problem solving  Orientation Level: Disoriented to;Place;Time;Situation Current Attention Level: Focused Memory: Decreased recall of precautions;Decreased short-term memory Following Commands: Follows one step commands  consistently;Follows multi-step commands inconsistently Safety/Judgement: Decreased awareness of safety;Decreased awareness of deficits Awareness: Intellectual Problem Solving: Slow processing;Decreased initiation;Difficulty sequencing;Requires verbal cues;Requires tactile cues        Exercises      General Comments        Pertinent Vitals/Pain Pain Assessment: Faces Faces Pain Scale: No hurt    Home Living                      Prior Function            PT Goals (current goals can now be found in the care plan section) Acute Rehab PT Goals Patient Stated Goal: none stated PT Goal Formulation: Patient unable to participate in goal setting Time For Goal Achievement: 09/26/19 Potential to Achieve Goals: Good Progress towards PT goals: Progressing toward goals    Frequency    Min 3X/week      PT Plan Current plan remains appropriate    Co-evaluation              AM-PAC PT "6 Clicks" Mobility   Outcome Measure  Help needed turning from your back to your side while in a flat bed without using bedrails?: A Little Help needed moving from lying on your back to sitting on the side of a flat bed without using bedrails?: A Lot Help needed moving to and from a bed to a chair (including a wheelchair)?: A Lot Help needed standing up from a chair using your arms (e.g., wheelchair or bedside chair)?: A Little Help needed to walk in hospital room?: A Lot Help needed climbing 3-5 steps with a railing? : Total 6 Click Score: 13    End of Session Equipment Utilized During Treatment: Gait belt;Other (comment) (sling/abduction pillow) Activity Tolerance: Patient limited by lethargy Patient left: in bed;with call bell/phone within reach;with bed alarm set Nurse Communication: Mobility status PT Visit Diagnosis: Muscle weakness (generalized) (M62.81);Other abnormalities of gait and mobility (R26.89);History of falling (Z91.81)     Time: 4098-1191 PT Time  Calculation (min) (ACUTE ONLY): 33 min  Charges:  $Gait Training: 23-37 mins                     Rolinda Roan, PT, DPT Acute Rehabilitation Services Pager: [phone] Office: [phone]    Thelma Comp 09/12/2019, 1:03 PM

## 2019-09-12 NOTE — Progress Notes (Signed)
PROGRESS NOTE    Samantha Aguirre  CHY:[id number] DOB: 06/26/1945 DOA: 09/06/2019 PCP: Tracie Harrier, MD    Chief Complaint  Patient presents with  . Altered Mental Status  . Fall    Brief Narrative: 74 year old lady prior history of paroxysmal atrial fibrillation, chronic diastolic heart failure, COPD, asthma, noninsulin-dependent type 2 diabetes mellitus, essential hypertension, hyperlipidemia, seizure disorder, history of CVA in the past, hypothyroidism, coronary artery disease, GERD, recent right shoulder arthroplasty on 08/25/2019 presents to ED from [facility] for evaluation of fever and altered mental status.  History of unwitnessed fall on Saturday.  Patient is confused and history was not available on admission.  On arrival to ED she was febrile tachypneic, had leukocytosis.  Blood cultures show enterobacter aerogenes.  Chest x-ray does not show any pneumonia urine analysis is negative, CT of the head is negative for acute intracranial abnormality.  CT of the abdomen pelvis does not show any acute infectious source.  X-rays of the knee negative for acute fracture or malalignment. She was initially started on IV vancomycin, ampicillin and Rocephin for evaluation of meningitis, later on transitioned to IV cefepime for bacteremia.   PT evaluation recommending SNF on discharge.  Meanwhile ID consulted for evaluation of Enterobacter bacteremia.  Echocardiogram showed  left ventricular EF OF 55  TO 60 %, without any evidence of valvular vegetations. PT evaluation recommending SNF. Orthopedics consulted and suggested that right shoulder is probably not the source of infection.  Pt seen and examined at bedside. No new complaints. PICC Line ordered. Repeat blood cultures have been negative.   Assessment & Plan:   Principal Problem:   AMS (altered mental status) Active Problems:   Acute on chronic anemia   Fever   SIRS (systemic inflammatory response syndrome) (HCC)   Hypokalemia    Bacteremia   Sepsis with encephalopathy without septic shock ([place])   Fall at nursing home   Vascular dementia without behavioral disturbance ([place])  Sepsis with Enterobacter aerogenes bacteremia.   Patient was empirically started on IV vancomycin, Rocephin and ampicillin later on transitioned to IV cefepime.   ID consulted, recommended 14 days of I V cefepime . Repeat cultures are negative . PICC line ordered and to be placed today.  Unclear source as her urine analysis is negative for infection.  CT of the abdomen and pelvis does not show any acute intra-abdominal pathology. MRI of the shoulder shows Accentuated edema signal in the humeral shaft just distal to the stem of the humeral component of the prosthesis. Given that the arthroplasty was only about 2 weeks ago, this degree of residual edema is not necessarily considered abnormal, but clearly early osteomyelitis is difficult to exclude. Discussed with ortho, suggested that right shoulder is probably not the source of infection. Since we do not know the source of infection, will plan for 2 weeks of treatment as per ID .    Acute metabolic encephalopathy Probably secondary to sepsis vs from being off keppra. She was also found to have elevated ammonia levels with mildly elevated liver enzymes and elevated alkaline phosphatase on admission.  Repeat liver enzymes wnl.  She does not appear to be in distress. She is alert and answering simple questions.   Seizures :  Pt has been off keppra for the last two weeks of unclear etiology.  Restarted the keppra and topamax.   Anemia of chronic disease and anemia of blood loss probably from right shoulder arthroplasty. Baseline hemoglobin appears to be around 12 dropped to 11  post surgery and currently at 8.9.  L0W iron levels and low folate levels on anemia panel.  Replace iron and folate. Repeat cbc show hemoglobin of 9.3. Transfuse to keep hemoglobin greater than 7.      Hypokalemia Replaced. Repeat labs in am.    Essential hypertension:  Well controlled.    Type 2 DM: with hyperglycemia, well controlled CBG'S  CBG (last 3)  Recent Labs    09/12/19 0336 09/12/19 0926 09/12/19 1127  GLUCAP 127* 194* 148*   Resume sliding scale insulin. Hemoglobin A1c is 6.3% No changes to medications.    Copd:  No wheezing heard.  Resume Bronchodilators as needed.    Chronic diastolic heart failure Last echocardiogram from October 2020 shows left ventricular ejection fraction of 55 to 60% with normal function.  She appears compensated.     Hypothyroidism Continue with Synthroid 125 MCG daily.   Hyperlipidemia Continue with Lipitor 80 mg daily.    History of coronary artery disease, CVA in the past. LP cancelled. Aspirin and plavix resumed by pharmacy.    Mild metabolic acidosis, non-anion gap Resolved.     Nutrition:  SLP evalution recommending dysphagia 1 diet / Pureed diet with thin liquid.  Liquids provided via: Cup;Straw Medication Administration: Crushed with puree Compensations: Minimize environmental distractions;Slow rate   DVT prophylaxis: SCDs Code Status: DNR Family Communication: None at bedside discussed with daughter over the phone on 09/09/19.  Disposition:   Status is: Inpatient  Remains inpatient appropriate because:Unsafe d/c plan and IV treatments appropriate due to intensity of illness or inability to take PO   Dispo: The patient is from: Home              Anticipated d/c is to: SNF              Anticipated d/c date is: 1 day              Patient currently is not medically stable to d/c.       Consultants:   Infectious disease  Procedures: None Antimicrobials: ( Anti-infectives (From admission, onward)   Start     Dose/Rate Route Frequency Ordered Stop   09/07/19 1200  ceFEPIme (MAXIPIME) 2 g in sodium chloride 0.9 % 100 mL IVPB        2 g 200 mL/hr over 30 Minutes Intravenous Every 8  hours 09/07/19 1058 09/22/19 2359   09/07/19 0800  vancomycin (VANCOREADY) IVPB 750 mg/150 mL  Status:  Discontinued        750 mg 150 mL/hr over 60 Minutes Intravenous Every 12 hours 09/06/19 1930 09/07/19 1100   09/07/19 0600  cefTRIAXone (ROCEPHIN) 2 g in sodium chloride 0.9 % 100 mL IVPB  Status:  Discontinued        2 g 200 mL/hr over 30 Minutes Intravenous Every 12 hours 09/06/19 2018 09/07/19 1045   09/06/19 2030  cefTRIAXone (ROCEPHIN) 1 g in sodium chloride 0.9 % 100 mL IVPB        1 g 200 mL/hr over 30 Minutes Intravenous  Once 09/06/19 2018 09/07/19 0132   09/06/19 2030  ampicillin (OMNIPEN) 2 g in sodium chloride 0.9 % 100 mL IVPB  Status:  Discontinued        2 g 300 mL/hr over 20 Minutes Intravenous Every 4 hours 09/06/19 2018 09/07/19 1100   09/06/19 2000  vancomycin (VANCOREADY) IVPB 1750 mg/350 mL        1,750 mg 175 mL/hr over 120 Minutes Intravenous  Once 09/06/19  1930 09/06/19 2238   09/06/19 1600  cefTRIAXone (ROCEPHIN) 1 g in sodium chloride 0.9 % 100 mL IVPB  Status:  Discontinued        1 g 200 mL/hr over 30 Minutes Intravenous Every 24 hours 09/06/19 1549 09/06/19 2018       Subjective: No chest pain or sob. No nausea, or vomiting.   Objective: Vitals:   09/11/19 1929 09/11/19 2339 09/12/19 0337 09/12/19 0815  BP: (!) 117/55 (!) 117/52 129/67 125/80  Pulse: 60 (!) 56 (!) 53 (!) 50  Resp: 18 19 16 18   Temp: 98.8 F (37.1 C) 98.1 F (36.7 C) 97.9 F (36.6 C) 97.7 F (36.5 C)  TempSrc:   Oral Oral  SpO2: 98% 100% 99% 100%  Weight:      Height:        Intake/Output Summary (Last 24 hours) at 09/12/2019 1326 Last data filed at 09/11/2019 1830 Gross per 24 hour  Intake 120 ml  Output --  Net 120 ml   Filed Weights   09/06/19 1523  Weight: 76.2 kg    Examination:  General exam: Alert and comfortable, not in any kind of distress. Respiratory system: Clear to auscultation bilaterally, no wheezing or rhonchi Cardiovascular system: S1-S2 heard,  regular rate rhythm, no JVD Gastrointestinal system: Abdomen is soft, nontender, nondistended, bowel sounds normal. Central nervous system: Patient is alert and oriented person, grossly nonfocal Extremities: No pedal edema, cyanosis Skin: No rashes seen Psychiatry: Mood is appropriate  data Reviewed: I have personally reviewed following labs and imaging studies  CBC: Recent Labs  Lab 09/08/19 1356 09/09/19 0502 09/10/19 0143 09/11/19 0132 09/12/19 0148  WBC 4.6 3.9* 4.1 5.1 6.6  NEUTROABS 4.0 3.0 2.7 3.3 4.1  HGB 10.5* 8.9* 9.2* 8.9* 9.3*  HCT 31.9* 27.7* 28.6* 28.0* 29.2*  MCV 90.1 88.8 88.3 88.9 91.5  PLT 202 183 210 215 734    Basic Metabolic Panel: Recent Labs  Lab 09/06/19 1617 09/06/19 2035 09/07/19 0557 09/08/19 0956 09/08/19 1356 09/09/19 0502 09/10/19 0143 09/11/19 0132  NA   < >  --  138 136  --  136 139 140  K   < >  --  3.4* 2.9*  --  3.3* 3.7 3.1*  CL   < >  --  107 106  --  108 109 109  CO2   < >  --  19* 19*  --  19* 20* 22  GLUCOSE   < >  --  134* 116*  --  146* 109* 135*  BUN   < >  --  14 15  --  15 12 10   CREATININE   < >  --  0.60 0.62  --  0.50 0.52 0.49  CALCIUM   < >  --  8.2* 8.3*  --  8.0* 8.5* 8.5*  MG  --  1.7  --   --  1.9 2.0 2.0 2.1   < > = values in this interval not displayed.    GFR: Estimated Creatinine Clearance: 56.8 mL/min (by C-G formula based on SCr of 0.49 mg/dL).  Liver Function Tests: Recent Labs  Lab 09/06/19 1617 09/07/19 0557 09/09/19 0502 09/10/19 0143 09/11/19 0132  AST 51* 49* 31 43* 36  ALT 46* 44 31 35 30  ALKPHOS 167* 148* 127* 124 127*  BILITOT 1.4* 0.7 0.4 0.6 0.6  PROT 5.8* 5.2* 5.2* 5.4* 5.4*  ALBUMIN 2.6* 2.3* 1.9* 2.0* 2.1*    CBG: Recent Labs  Lab  09/11/19 1927 09/11/19 2339 09/12/19 0336 09/12/19 0926 09/12/19 1127  GLUCAP 240* 97 127* 194* 148*     Recent Results (from the past 240 hour(s))  Blood Culture (routine x 2)     Status: Abnormal   Collection Time: 09/06/19  4:17 PM    Specimen: BLOOD LEFT FOREARM  Result Value Ref Range Status   Specimen Description BLOOD LEFT FOREARM  Final   Special Requests   Final    BOTTLES DRAWN AEROBIC AND ANAEROBIC Blood Culture adequate volume   Culture  Setup Time   Final    GRAM NEGATIVE RODS IN BOTH AEROBIC AND ANAEROBIC BOTTLES Organism ID to follow CRITICAL RESULT CALLED TO, READ BACK BY AND VERIFIED WITH: Molly Maduro PharmD 9:10 09/07/19 ([email or web address]) Performed at [facility], [place] [address] [place], [place] [address]    Culture ENTEROBACTER AEROGENES (A)  Final   Report Status 09/09/2019 FINAL  Final   Organism ID, Bacteria ENTEROBACTER AEROGENES  Final      Susceptibility   Enterobacter aerogenes - MIC*    CEFAZOLIN >=64 RESISTANT Resistant     CEFEPIME <=0.12 SENSITIVE Sensitive     CEFTAZIDIME <=1 SENSITIVE Sensitive     CEFTRIAXONE <=0.25 SENSITIVE Sensitive     CIPROFLOXACIN <=0.25 SENSITIVE Sensitive     GENTAMICIN <=1 SENSITIVE Sensitive     IMIPENEM 2 SENSITIVE Sensitive     TRIMETH/SULFA <=20 SENSITIVE Sensitive     PIP/TAZO <=4 SENSITIVE Sensitive     * ENTEROBACTER AEROGENES  Blood Culture ID Panel (Reflexed)     Status: Abnormal   Collection Time: 09/06/19  4:17 PM  Result Value Ref Range Status   Enterococcus faecalis NOT DETECTED NOT DETECTED Final   Enterococcus Faecium NOT DETECTED NOT DETECTED Final   Listeria monocytogenes NOT DETECTED NOT DETECTED Final   Staphylococcus species NOT DETECTED NOT DETECTED Final   Staphylococcus aureus (BCID) NOT DETECTED NOT DETECTED Final   Staphylococcus epidermidis NOT DETECTED NOT DETECTED Final   Staphylococcus lugdunensis NOT DETECTED NOT DETECTED Final   Streptococcus species NOT DETECTED NOT DETECTED Final   Streptococcus agalactiae NOT DETECTED NOT DETECTED Final   Streptococcus pneumoniae NOT DETECTED NOT DETECTED Final   Streptococcus pyogenes NOT DETECTED NOT DETECTED Final   A.calcoaceticus-baumannii NOT DETECTED NOT DETECTED Final    Bacteroides fragilis NOT DETECTED NOT DETECTED Final   Enterobacterales DETECTED (A) NOT DETECTED Final    Comment: Enterobacterales represent a large order of gram negative bacteria, not a single organism. CRITICAL RESULT CALLED TO, READ BACK BY AND VERIFIED WITH: Molly Maduro PharmD 9:10 09/07/19 ([email or web address])    Enterobacter cloacae complex NOT DETECTED NOT DETECTED Final   Escherichia coli NOT DETECTED NOT DETECTED Final   Klebsiella aerogenes DETECTED (A) NOT DETECTED Final    Comment: CRITICAL RESULT CALLED TO, READ BACK BY AND VERIFIED WITH: Molly Maduro PharmD 9:10 09/07/19 ([email or web address])    Klebsiella oxytoca NOT DETECTED NOT DETECTED Final   Klebsiella pneumoniae NOT DETECTED NOT DETECTED Final   Proteus species NOT DETECTED NOT DETECTED Final   Salmonella species NOT DETECTED NOT DETECTED Final   Serratia marcescens NOT DETECTED NOT DETECTED Final   Haemophilus influenzae NOT DETECTED NOT DETECTED Final   Neisseria meningitidis NOT DETECTED NOT DETECTED Final   Pseudomonas aeruginosa NOT DETECTED NOT DETECTED Final   Stenotrophomonas maltophilia NOT DETECTED NOT DETECTED Final   Candida albicans NOT DETECTED NOT DETECTED Final   Candida auris NOT DETECTED NOT DETECTED Final   Candida glabrata NOT DETECTED NOT  DETECTED Final   Candida krusei NOT DETECTED NOT DETECTED Final   Candida parapsilosis NOT DETECTED NOT DETECTED Final   Candida tropicalis NOT DETECTED NOT DETECTED Final   Cryptococcus neoformans/gattii NOT DETECTED NOT DETECTED Final   CTX-M ESBL NOT DETECTED NOT DETECTED Final   Carbapenem resistance IMP NOT DETECTED NOT DETECTED Final   Carbapenem resistance KPC NOT DETECTED NOT DETECTED Final   Carbapenem resistance NDM NOT DETECTED NOT DETECTED Final   Carbapenem resist OXA 48 LIKE NOT DETECTED NOT DETECTED Final   Carbapenem resistance VIM NOT DETECTED NOT DETECTED Final    Comment: Performed at [facility], [place] [address] [place], [place] [address]  Urine culture      Status: None   Collection Time: 09/06/19  4:18 PM   Specimen: In/Out Cath Urine  Result Value Ref Range Status   Specimen Description IN/OUT CATH URINE  Final   Special Requests NONE  Final   Culture   Final    NO GROWTH Performed at [facility], [place] [address], [place], [place] [address]    Report Status 09/07/2019 FINAL  Final  SARS Coronavirus 2 by RT PCR (hospital order, performed in [facility] hospital lab) Nasopharyngeal Urine, Catheterized     Status: None   Collection Time: 09/06/19  4:18 PM   Specimen: Urine, Catheterized; Nasopharyngeal  Result Value Ref Range Status   SARS Coronavirus 2 NEGATIVE NEGATIVE Final    Comment: (NOTE) SARS-CoV-2 target nucleic acids are NOT DETECTED.  The SARS-CoV-2 RNA is generally detectable in upper and lower respiratory specimens during the acute phase of infection. The lowest concentration of SARS-CoV-2 viral copies this assay can detect is 250 copies / mL. A negative result does not preclude SARS-CoV-2 infection and should not be used as the sole basis for treatment or other patient management decisions.  A negative result may occur with improper specimen collection / handling, submission of specimen other than nasopharyngeal swab, presence of viral mutation(s) within the areas targeted by this assay, and inadequate number of viral copies (<250 copies / mL). A negative result must be combined with clinical observations, patient history, and epidemiological information.  Fact Sheet for Patients:   StrictlyIdeas.no  Fact Sheet for Healthcare Providers: BankingDealers.co.za  This test is not yet approved or  cleared by the [place] FDA and has been authorized for detection and/or diagnosis of SARS-CoV-2 by FDA under an Emergency Use Authorization (EUA).  This EUA will remain in effect (meaning this test can be used) for the duration of the COVID-19 declaration under Section  564(b)(1) of the Act, 21 U.S.C. section 360bbb-3(b)(1), unless the authorization is terminated or revoked sooner.  Performed at [facility], [place] [address] [place], [place] [address]   Blood Culture (routine x 2)     Status: Abnormal   Collection Time: 09/06/19  4:21 PM   Specimen: BLOOD  Result Value Ref Range Status   Specimen Description BLOOD LEFT UPPER ARM  Final   Special Requests   Final    BOTTLES DRAWN AEROBIC AND ANAEROBIC Blood Culture adequate volume   Culture  Setup Time   Final    GRAM NEGATIVE RODS IN BOTH AEROBIC AND ANAEROBIC BOTTLES CRITICAL VALUE NOTED.  VALUE IS CONSISTENT WITH PREVIOUSLY REPORTED AND CALLED VALUE.    Culture (A)  Final    ENTEROBACTER AEROGENES SUSCEPTIBILITIES PERFORMED ON PREVIOUS CULTURE WITHIN THE LAST 5 DAYS. Performed at [facility], [place],  [place] [address]    Report Status 09/09/2019 FINAL  Final  Culture, blood (routine x 2)     Status: None (Preliminary result)   Collection Time: 09/09/19  7:40 AM   Specimen: BLOOD  Result Value Ref Range Status   Specimen Description BLOOD BLOOD LEFT HAND  Final   Special Requests   Final    BOTTLES DRAWN AEROBIC AND ANAEROBIC Blood Culture adequate volume   Culture   Final    NO GROWTH 3 DAYS Performed at [facility], [place] [address] [place], [place] [address]    Report Status PENDING  Incomplete  Culture, blood (routine x 2)     Status: None (Preliminary result)   Collection Time: 09/09/19  7:40 AM   Specimen: BLOOD  Result Value Ref Range Status   Specimen Description BLOOD LEFT ANTECUBITAL  Final   Special Requests   Final    BOTTLES DRAWN AEROBIC AND ANAEROBIC Blood Culture adequate volume   Culture   Final    NO GROWTH 3 DAYS Performed at [facility], [place] [address], [place], [place] [address]    Report Status PENDING  Incomplete         Radiology Studies: [place] EKG SITE RITE  Result Date: 09/10/2019 If Site Rite image not  attached, placement could not be confirmed due to current cardiac rhythm.       Scheduled Meds: . acetaminophen  650 mg Oral Q8H  . aspirin  81 mg Oral Daily  . atorvastatin  80 mg Oral q1800  . clopidogrel  75 mg Oral Daily  . DULoxetine  60 mg Oral Daily  . feeding supplement (PROSource TF)  45 mL Per Tube BID  . ferrous sulfate  325 mg Oral BID WC  . folic acid  1 mg Oral Daily  . gabapentin  600 mg Oral BID  . insulin aspart  0-9 Units Subcutaneous Q4H  . levETIRAcetam  500 mg Oral BID  . levothyroxine  125 mcg Oral Q0600  . multivitamin with minerals  1 tablet Oral Daily  . topiramate  25 mg Oral BID   Continuous Infusions: . ceFEPime (MAXIPIME) IV 2 g (09/12/19 7092)     LOS: 6 days       Hosie Poisson, MD [facility]   To contact the attending provider between 7A-7P or the covering provider during after hours 7P-7A, please log into the web site [email or web address] and access using universal [place] password for that web site. If you do not have the password, please call the hospital operator.  09/12/2019, 1:26 PM

## 2019-09-12 NOTE — Discharge Summary (Signed)
Physician Discharge Summary  Samantha Aguirre:[id number] DOB: 1945/04/07 DOA: 09/06/2019  PCP: Tracie Harrier, MD  Admit date: 09/06/2019 Discharge date: 09/12/2019  Admitted From: SNF Disposition:  snf  Recommendations for Outpatient Follow-up:  1. Follow up with PCP in 1-2 weeks 2. Please obtain BMP/CBC in one week Please follow up with ID as recommended.   Discharge Condition:GUARDED.  CODE STATUS: DNR  Diet recommendation: Heart Healthy  Brief/Interim Summary: 74 year old lady prior history of paroxysmal atrial fibrillation, chronic diastolic heart failure, COPD, asthma, noninsulin-dependent type 2 diabetes mellitus, essential hypertension, hyperlipidemia, seizure disorder, history of CVA in the past, hypothyroidism, coronary artery disease, GERD, recent right shoulder arthroplasty on 08/25/2019 presents to ED from [facility] for evaluation of fever and altered mental status.  History of unwitnessed fall on Saturday.  Patient is confused and history was not available on admission.  On arrival to ED she was febrile tachypneic, had leukocytosis.  Blood cultures show enterobacter aerogenes.  Chest x-ray does not show any pneumonia urine analysis is negative, CT of the head is negative for acute intracranial abnormality.  CT of the abdomen pelvis does not show any acute infectious source.  X-rays of the knee negative for acute fracture or malalignment. She was initially started on IV vancomycin, ampicillin and Rocephin for evaluation of meningitis, later on transitioned to IV cefepime for bacteremia.   PT evaluation recommending SNF on discharge.  Meanwhile ID consulted for evaluation of Enterobacter bacteremia.  Echocardiogram showed  left ventricular EF OF 55  TO 60 %, without any evidence of valvular vegetations. PT evaluation recommending SNF. Orthopedics consulted and suggested that right shoulder is probably not the source of infection  Discharge Diagnoses:  Principal Problem:    AMS (altered mental status) Active Problems:   Acute on chronic anemia   Fever   SIRS (systemic inflammatory response syndrome) (HCC)   Hypokalemia   Bacteremia   Sepsis with encephalopathy without septic shock ([place])   Fall at nursing home   Vascular dementia without behavioral disturbance ([place])  Sepsis with Enterobacter aerogenes bacteremia.   Patient was empirically started on IV vancomycin, Rocephin and ampicillin later on transitioned to IV cefepime.   ID consulted, recommended 14 days of I V cefepime . Repeat cultures are negative . Mid line ordered and placed today. Unclear source as her urine analysis is negative for infection.  CT of the abdomen and pelvis does not show any acute intra-abdominal pathology. MRI of the shoulder shows Accentuated edema signal in the humeral shaft just distal to the stem of the humeral component of the prosthesis. Given that the arthroplasty was only about 2 weeks ago, this degree of residual edema is not necessarily considered abnormal, but clearly early osteomyelitis is difficult to exclude. Discussed with ortho, suggested that right shoulder is probably not the source of infection. Since we do not know the source of infection, will plan for 2 weeks of treatment as per ID .    Acute metabolic encephalopathy Probably secondary to sepsis vs from being off keppra. She was also found to have elevated ammonia levels with mildly elevated liver enzymes and elevated alkaline phosphatase on admission.  Repeat liver enzymes wnl.  She does not appear to be in distress. She is alert and answering simple questions.   Seizures :  Pt has been off keppra for the last two weeks of unclear etiology.  Restarted the keppra and topamax.   Anemia of chronic disease and anemia of blood loss probably from right shoulder arthroplasty.  Baseline hemoglobin appears to be around 12 dropped to 11 post surgery and currently at 8.9.  L0W iron levels and low folate levels  on anemia panel.  Replace iron and folate. Repeat cbc show hemoglobin of 9.3. Transfuse to keep hemoglobin greater than 7.     Hypokalemia Replaced.    Essential hypertension:  Well controlled.    Type 2 DM: with hyperglycemia, well controlled CBG'S  CBG (last 3)  Recent Labs (last 2 labs)        Recent Labs    09/12/19 0336 09/12/19 0926 09/12/19 1127  GLUCAP 127* 194* 148*     RESUME Home meds. . Hemoglobin A1c is 6.3% No changes to medications.    Copd:  No wheezing heard.  Resume Bronchodilators as needed.    Chronic diastolic heart failure Last echocardiogram from October 2020 shows left ventricular ejection fraction of 55 to 60% with normal function.  She appears compensated.     Hypothyroidism Continue with Synthroid 125 MCG daily.   Hyperlipidemia Continue with Lipitor 80 mg daily.    History of coronary artery disease, CVA in the past. LP cancelled. Aspirin and plavix resumed by pharmacy.    Mild metabolic acidosis, non-anion gap Resolved.     Nutrition:  SLP evalution recommending dysphagia 1 diet / Pureed diet with thin liquid.  Liquids provided via: Cup;Straw Medication Administration: Crushed with puree Compensations: Minimize environmental distractions;Slow rate    Discharge Instructions  Discharge Instructions    [facility] pharmacist to adjust dose for Vancomycin, Aminoglycosides and other anti-infective therapies as requested by physician.   Complete by: As directed    [facility] to provide Cath Flo 26m   Complete by: As directed    Administer for PICC line occlusion and as ordered by physician for other access device issues.   Anaphylaxis Kit: Provided to treat any anaphylactic reaction to the medication being provided to the patient if First Dose or when requested by physician   Complete by: As directed    Epinephrine 161mml vial / amp: Administer 0.64m38m0.64ml42msubcutaneously once for moderate to severe anaphylaxis, nurse to call physician and pharmacy when reaction occurs and call 911 if needed for immediate care   Diphenhydramine 50mg4mIV vial: Administer 25-50mg 14mM PRN for first dose reaction, rash, itching, mild reaction, nurse to call physician and pharmacy when reaction occurs   Sodium Chloride 0.9% NS 500ml I50mdminister if needed for hypovolemic blood pressure drop or as ordered by physician after call to physician with anaphylactic reaction   Change dressing on IV access line weekly and PRN   Complete by: As directed    Diet - low sodium heart healthy   Complete by: As directed    Discharge instructions   Complete by: As directed    Follow up with PCP and ID as recommended.   Flush IV access with Sodium Chloride 0.9% and Heparin 10 units/ml or 100 units/ml   Complete by: As directed    Home infusion instructions - [facility]   Complete by: As directed    Instructions: Flush IV access with Sodium Chloride 0.9% and Heparin 10units/ml or 100units/ml   Change dressing on IV access line: Weekly and PRN   Instructions Cath Flo 2mg: Ad364mister for PICC Line occlusion and as ordered by physician for other access device   [facility] pharmacist to adjust dose for: Vancomycin, Aminoglycosides and other anti-infective therapies as requested by physician   Increase activity slowly  Complete by: As directed    Method of administration may be changed at the discretion of home infusion pharmacist based upon assessment of the patient and/or caregiver's ability to self-administer the medication ordered   Complete by: As directed    No dressing needed   Complete by: As directed      Allergies as of 09/12/2019      Reactions   Ivp Dye [iodinated Diagnostic Agents] Itching   Pt injected with IV contrast.  5 min after injection pt complained of itching behind ear and on her abd.   Methotrexate Rash   Morphine And Related  Other (See Comments)   "stops my breathing" NEAR-RESPIRATORY ARREST   Mushroom Extract Complex Anaphylaxis   Made patient "deathly sick"   Atenolol Other (See Comments)   "MD took me off of it bc it was doing something wrong" Made patient HYPOTENSIVE   Percocet [oxycodone-acetaminophen] Nausea And Vomiting, Other (See Comments)   Stomach pains   Percodan [oxycodone-aspirin] Nausea And Vomiting, Other (See Comments)   Stomach pains   Tramadol Nausea Only   Verapamil Other (See Comments)   " MD told me this was screwing me up" MAKES PATIENT HYPOTENSIVE   Oxycodone Hcl       Medication List    STOP taking these medications   traMADol 50 MG tablet Commonly known as: ULTRAM     TAKE these medications   albuterol 108 (90 Base) MCG/ACT inhaler Commonly known as: VENTOLIN HFA Inhale 2 puffs into the lungs every 6 (six) hours as needed for wheezing or shortness of breath.   aspirin 81 MG chewable tablet Commonly known as: Aspirin Childrens Chew 1 tablet (81 mg total) by mouth daily.   atorvastatin 80 MG tablet Commonly known as: LIPITOR Take 1 tablet (80 mg total) by mouth daily at 6 PM.   ceFEPime  IVPB Commonly known as: MAXIPIME Inject 2 g into the vein every 8 (eight) hours for 10 days. Indication:  Bacteremia First Dose: Yes Last Day of Therapy:  9/9 Labs - Once weekly:  CBC/D and BMP, Labs - Every other week:  ESR and CRP Method of administration: IV Push Method of administration may be changed at the discretion of home infusion pharmacist based upon assessment of the patient and/or caregiver's ability to self-administer the medication ordered.   clopidogrel 75 MG tablet Commonly known as: PLAVIX Take 75 mg by mouth daily.   DULoxetine 60 MG capsule Commonly known as: CYMBALTA Take 1 capsule (60 mg total) by mouth at bedtime. What changed: when to take this   ferrous sulfate 325 (65 FE) MG EC tablet Take 1 tablet (325 mg total) by mouth 2 (two) times daily with  a meal. What changed: when to take this   fluticasone 50 MCG/ACT nasal spray Commonly known as: FLONASE Place 1 spray into both nostrils 2 (two) times daily as needed for allergies.   folic acid 1 MG tablet Commonly known as: FOLVITE Take 1 tablet (1 mg total) by mouth daily. Start taking on: September 13, 2019   furosemide 20 MG tablet Commonly known as: LASIX Take 20 mg by mouth daily.   gabapentin 800 MG tablet Commonly known as: NEURONTIN Take 1 tablet (800 mg total) by mouth 2 (two) times daily.   glipiZIDE-metformin 5-500 MG tablet Commonly known as: METAGLIP Take 1 tablet by mouth 2 (two) times daily before a meal.   levETIRAcetam 500 MG tablet Commonly known as: KEPPRA Take 1 tablet (500 mg total) by  mouth 2 (two) times daily.   levothyroxine 125 MCG tablet Commonly known as: SYNTHROID Take 125 mcg by mouth daily before breakfast.   Multiple Vitamin-Folic Acid Tabs Take 1 tablet by mouth daily.   nitroGLYCERIN 0.4 MG SL tablet Commonly known as: NITROSTAT Place 1 tablet (0.4 mg total) under the tongue every 5 (five) minutes as needed for chest pain.   omeprazole 20 MG capsule Commonly known as: PRILOSEC Take 20 mg by mouth daily.   topiramate 25 MG tablet Commonly known as: TOPAMAX Take 25 mg by mouth 2 (two) times daily.   vitamin B-12 1000 MCG tablet Commonly known as: CYANOCOBALAMIN Take 1,000 mcg by mouth daily.            Discharge Care Instructions  (From admission, onward)         Start     Ordered   09/12/19 0000  Change dressing on IV access line weekly and PRN  (Home infusion instructions - [facility] )        09/12/19 1523   09/12/19 0000  No dressing needed        09/12/19 1523          Allergies  Allergen Reactions  . Ivp Dye [Iodinated Diagnostic Agents] Itching    Pt injected with IV contrast.  5 min after injection pt complained of itching behind ear and on her abd.  . Methotrexate Rash  . Morphine And  Related Other (See Comments)    "stops my breathing" NEAR-RESPIRATORY ARREST  . Mushroom Extract Complex Anaphylaxis    Made patient "deathly sick"  . Atenolol Other (See Comments)    "MD took me off of it bc it was doing something wrong" Made patient HYPOTENSIVE  . Percocet [Oxycodone-Acetaminophen] Nausea And Vomiting and Other (See Comments)    Stomach pains  . Percodan [Oxycodone-Aspirin] Nausea And Vomiting and Other (See Comments)    Stomach pains  . Tramadol Nausea Only  . Verapamil Other (See Comments)    " MD told me this was screwing me up" MAKES PATIENT HYPOTENSIVE  . Oxycodone Hcl     Consultations: ID  Procedures/Studies: CT ABDOMEN PELVIS WO CONTRAST  Result Date: 09/06/2019 CLINICAL DATA:  Abdominal pain and fevers with elevated LFTs EXAM: CT ABDOMEN AND PELVIS WITHOUT CONTRAST TECHNIQUE: Multidetector CT imaging of the abdomen and pelvis was performed following the standard protocol without IV contrast. COMPARISON:  07/15/2017 FINDINGS: Lower chest: No acute abnormality. Hepatobiliary: No focal liver abnormality is seen. Status post cholecystectomy. No biliary dilatation. Pancreas: Unremarkable. No pancreatic ductal dilatation or surrounding inflammatory changes. Spleen: Normal in size without focal abnormality. Adrenals/Urinary Tract: Adrenal glands are stable in appearance. Mild hyperplasia in the left adrenal gland is seen. The kidneys demonstrate a tiny nonobstructing stone in the lower pole of the right kidney. No obstructive changes are seen. The bladder is decompressed. Stomach/Bowel: Mild retained fecal material is noted within the colon. No obstructive or inflammatory changes are seen. The appendix is within normal limits. Small bowel and stomach are unremarkable. Vascular/Lymphatic: Aortic atherosclerosis. No enlarged abdominal or pelvic lymph nodes. Reproductive: Status post hysterectomy. No adnexal masses. Other: No abdominal wall hernia or abnormality. No  abdominopelvic ascites. Musculoskeletal: Changes of prior vertebral augmentation are seen at L5. Chronic L1 compression deformity is seen. IMPRESSION: Chronic changes similar to that seen on the prior exam. No acute abnormality correspond with the patient's given clinical symptomatology is noted. Electronically Signed   By: Linus Mako.D.  On: 09/06/2019 17:48   CT Head Wo Contrast  Result Date: 09/06/2019 CLINICAL DATA:  Mental status changes. Unwitnessed fall, on blood thinners EXAM: CT HEAD WITHOUT CONTRAST TECHNIQUE: Contiguous axial images were obtained from the base of the skull through the vertex without intravenous contrast. COMPARISON:  06/15/2019 FINDINGS: Brain: Old left parietal infarct, stable. Stable old cerebellar infarcts bilaterally. There is atrophy and chronic small vessel disease changes. No acute intracranial abnormality. Specifically, no hemorrhage, hydrocephalus, mass lesion, acute infarction, or significant intracranial injury. Vascular: No hyperdense vessel or unexpected calcification. Skull: No acute calvarial abnormality. Sinuses/Orbits: Visualized paranasal sinuses and mastoids clear. Orbital soft tissues unremarkable. Other: None IMPRESSION: Old left parietal and bilateral cerebellar infarcts. Atrophy, chronic microvascular disease. No acute intracranial abnormality. Electronically Signed   By: Rolm Baptise M.D.   On: 09/06/2019 17:46   CT Shoulder Right Wo Contrast  Result Date: 09/06/2019 CLINICAL DATA:  Septic arthritis suspected, shoulder, xray done recent shoulder replacement, sepsis unknown source EXAM: CT OF THE UPPER RIGHT EXTREMITY WITHOUT CONTRAST TECHNIQUE: Multidetector CT imaging of the upper right extremity was performed according to the standard protocol. COMPARISON:  Most recent radiograph 08/25/2019 FINDINGS: Bones/Joint/Cartilage Reverse shoulder arthroplasty. There is regional streak artifact. Allowing for artifact, no periprosthetic lucency. Humeral stem  is midline. Limited assessment for joint effusion given surrounding streak artifact. No obvious effusion. Ligaments Suboptimally assessed by CT. Muscles and Tendons No evidence of intramuscular fluid collection. Muscle bulk maintained. Soft tissues Mild subcutaneous stranding posteriorly. Anterior skin staples in place. There is no subcutaneous fluid collection. No axillary adenopathy. IMPRESSION: 1. Reverse shoulder arthroplasty. There is streak artifact from surgical hardware that limits regional evaluation. Allowing for this, no obvious joint effusion or findings to suspect septic joint. Joint aspiration could be considered if there is persistent clinical concern. 2. Mild subcutaneous stranding posteriorly may be postsurgical, no subcutaneous fluid collection. Electronically Signed   By: Keith Rake M.D.   On: 09/06/2019 22:07   MR SHOULDER RIGHT WO CONTRAST  Result Date: 09/09/2019 CLINICAL DATA:  Right shoulder pain, clinical suspicion for septic arthritis. The patient has a reverse shoulder arthroplasty. EXAM: MRI OF THE RIGHT SHOULDER WITHOUT CONTRAST TECHNIQUE: Multiplanar, multisequence MR imaging of the shoulder was performed. No intravenous contrast was administered. COMPARISON:  CT right shoulder 09/06/2019 FINDINGS: The patient was not alert or oriented during imaging and was unable to cooperate with breath holding, resulting and motion artifact. Moreover, there is a reverse shoulder arthroplasty which introduces extensive metal artifact in the region of interest. Rotator cuff:  Obscured by metal artifact. Muscles: Atrophic supraspinatus which might indirectly imply chronic supraspinatus tendon rupture. Low-grade infiltrative edema in the subscapularis, infraspinatus, and teres minor muscles. Low-grade infiltrative edema tracking within along the deltoid muscle, especially along the superficial fascia margin. Edema tracks along the fascial margins of the teres major muscle. Biceps long head:   Completely obscured by metal artifact. Acromioclavicular Joint: Suspected prior acromioplasty. Questionable prior Mumford procedure. Type II acromion. No well-defined bursitis. Glenohumeral Joint: Completely obscured by metal artifact. No large fluid collection in the joint extending beyond the margins of the metal artifact. Labrum:  N/A Bones: Accentuated edema signal in the humeral shaft just distal to the stem of the humeral component of the prosthesis, for example on image 5/22 and image 29/15. Given that the arthroplasty was only about 2 weeks ago, this degree of residual edema may not be necessarily considered abnormal, but clearly early osteomyelitis is difficult to exclude. I do not observe definite indicators  of unusual bony demineralization or fracture in this vicinity on the recent CT from 09/06/2019. Other: No supplemental non-categorized findings. IMPRESSION: 1. Accentuated edema signal in the humeral shaft just distal to the stem of the humeral component of the prosthesis. Given that the arthroplasty was only about 2 weeks ago, this degree of residual edema is not necessarily considered abnormal, but clearly early osteomyelitis is difficult to exclude. 2. Low-grade infiltrative edema in the subscapularis, infraspinatus, and teres minor muscles. 3. Low-grade infiltrative edema tracking within along the superficial fascia margins of the deltoid muscle and along the fascial margins of the teres major muscle. 4. Suspected prior acromioplasty. 5. No obvious effusion extending beyond the boundaries of the metal artifact, and no definite drainable fluid collection. Regional ultrasound may be helpful in assessing for fluid pockets of might be otherwise obscured by the metal artifact. 6. Markedly atrophic supraspinatus muscle may be an indicator of chronic supraspinatus tear. Electronically Signed   By: Van Clines M.D.   On: 09/09/2019 09:10   DG Chest Port 1 View  Result Date: 09/06/2019 CLINICAL  DATA:  Possible sepsis EXAM: PORTABLE CHEST 1 VIEW COMPARISON:  06/15/2019 FINDINGS: No new consolidation or edema. No pleural effusion. Similar cardiomediastinal contours. Interval right reverse shoulder arthroplasty. IMPRESSION: No acute process in the chest. Electronically Signed   By: Macy Mis M.D.   On: 09/06/2019 16:37   DG Shoulder Right Port  Result Date: 08/25/2019 CLINICAL DATA:  Post reverse shoulder arthroplasty EXAM: PORTABLE RIGHT SHOULDER COMPARISON:  MRI 05/17/2019 FINDINGS: Postsurgical changes from reverse shoulder arthroplasty with an articulating humeral stem and screw fixed glenoid component in expected alignment for these somewhat nonstandard AP and oblique projections. Overlying postsurgical soft tissue changes and skin staples are noted. No acute complication is clearly evident. IMPRESSION: Postsurgical changes from reverse shoulder arthroplasty without evidence of acute complication. Electronically Signed   By: Lovena Le M.D.   On: 08/25/2019 17:12   DG Knee Complete 4 Views Right  Result Date: 09/06/2019 CLINICAL DATA:  Fall EXAM: RIGHT KNEE - COMPLETE 4+ VIEW COMPARISON:  November 13, 2015 FINDINGS: No evidence of acute fracture. No malalignment. Likely small joint effusion, although obliquity of the lateral radiograph limits evaluation. Diffuse osteopenia. Joint spaces are relatively well maintained. Osteophytic spurring arising from bilateral femoral condyles. Superior patellar enthesophyte. Mild soft tissue swelling about the knee. Vascular calcifications. IMPRESSION: No evidence of acute fracture or malalignment. Small joint effusion. Electronically Signed   By: Margaretha Sheffield MD   On: 09/06/2019 16:50   ECHOCARDIOGRAM COMPLETE  Result Date: 09/07/2019    ECHOCARDIOGRAM REPORT   Patient Name:   Samantha Aguirre Date of Exam: 09/07/2019 Medical Rec #:  [id number]        Height:       61.0 in Accession #:    [id number]       Weight:       168.0 lb Date of Birth:   1945/10/17        BSA:          1.754 m Patient Age:    19 years         BP:           118/61 mmHg Patient Gender: F                HR:           69 bpm. Exam Location:  Inpatient Procedure: 2D Echo, Cardiac Doppler, Color Doppler and Intracardiac  Opacification Agent Indications:    Bacteremia  History:        Patient has prior history of Echocardiogram examinations, most                 recent 03/14/2018. CHF, CAD, COPD, Arrythmias:Atrial Fibrillation;                 Risk Factors:Diabetes and Current Smoker.  Sonographer:    Clayton Lefort RDCS (AE) Referring Phys: [facility]  Sonographer Comments: Technically challenging study due to limited acoustic windows, Technically difficult study due to poor echo windows, suboptimal parasternal window, suboptimal apical window and patient is morbidly obese. Suboptimal image quality for  evaluation of valves in multiple views. IMPRESSIONS  1. Left ventricular ejection fraction, by estimation, is 55 to 60%. The left ventricle has normal function. The left ventricle has no regional wall motion abnormalities. Left ventricular diastolic parameters were normal.  2. Right ventricular systolic function is normal. The right ventricular size is mildly enlarged. Tricuspid regurgitation signal is inadequate for assessing PA pressure.  3. The mitral valve is grossly normal. No evidence of mitral valve regurgitation. No evidence of mitral stenosis.  4. The aortic valve is grossly normal. Aortic valve regurgitation is not visualized. Mild aortic valve sclerosis is present, with no evidence of aortic valve stenosis.  5. The inferior vena cava is dilated in size with >50% respiratory variability, suggesting right atrial pressure of 8 mmHg. Comparison(s): No significant change from prior study. Conclusion(s)/Recommendation(s): No evidence of valvular vegetations on this transthoracic echocardiogram. Would recommend a transesophageal echocardiogram to exclude infective  endocarditis if clinically indicated. FINDINGS  Left Ventricle: Left ventricular ejection fraction, by estimation, is 55 to 60%. The left ventricle has normal function. The left ventricle has no regional wall motion abnormalities. The left ventricular internal cavity size was normal in size. There is  no left ventricular hypertrophy. Left ventricular diastolic parameters were normal. Right Ventricle: The right ventricular size is mildly enlarged. No increase in right ventricular wall thickness. Right ventricular systolic function is normal. Tricuspid regurgitation signal is inadequate for assessing PA pressure. Left Atrium: Left atrial size was normal in size. Right Atrium: Right atrial size was normal in size. Pericardium: There is no evidence of pericardial effusion. Presence of pericardial fat pad. Mitral Valve: The mitral valve is grossly normal. No evidence of mitral valve regurgitation. No evidence of mitral valve stenosis. Tricuspid Valve: The tricuspid valve is grossly normal. Tricuspid valve regurgitation is trivial. No evidence of tricuspid stenosis. Aortic Valve: The aortic valve is grossly normal. Aortic valve regurgitation is not visualized. Mild aortic valve sclerosis is present, with no evidence of aortic valve stenosis. Aortic valve mean gradient measures 4.5 mmHg. Aortic valve peak gradient measures 8.6 mmHg. Aortic valve area, by VTI measures 2.47 cm. Pulmonic Valve: The pulmonic valve was grossly normal. Pulmonic valve regurgitation is not visualized. No evidence of pulmonic stenosis. Aorta: The aortic root and ascending aorta are structurally normal, with no evidence of dilitation. Venous: The inferior vena cava is dilated in size with greater than 50% respiratory variability, suggesting right atrial pressure of 8 mmHg. IAS/Shunts: The atrial septum is grossly normal. EKG: Rhythm strip during this exam demostrated normal sinus rhythm and premature atrial contractions.  LEFT VENTRICLE PLAX 2D  LVIDd:         5.00 cm  Diastology LVIDs:         3.70 cm  LV e' lateral:   9.79 cm/s LV PW:  1.10 cm  LV E/e' lateral: 9.9 LV IVS:        1.00 cm  LV e' medial:    8.49 cm/s LVOT diam:     2.00 cm  LV E/e' medial:  11.4 LV SV:         71 LV SV Index:   40 LVOT Area:     3.14 cm  RIGHT VENTRICLE             IVC RV Basal diam:  3.80 cm     IVC diam: 2.40 cm RV Mid diam:    2.80 cm RV S prime:     16.40 cm/s TAPSE (M-mode): 2.5 cm LEFT ATRIUM             Index       RIGHT ATRIUM           Index LA diam:        2.40 cm 1.37 cm/m  RA Area:     15.70 cm LA Vol (A2C):   35.3 ml 20.13 ml/m RA Volume:   41.20 ml  23.49 ml/m LA Vol (A4C):   47.0 ml 26.80 ml/m LA Biplane Vol: 43.8 ml 24.97 ml/m  AORTIC VALVE AV Area (Vmax):    2.82 cm AV Area (Vmean):   2.48 cm AV Area (VTI):     2.47 cm AV Vmax:           147.00 cm/s AV Vmean:          95.200 cm/s AV VTI:            0.287 m AV Peak Grad:      8.6 mmHg AV Mean Grad:      4.5 mmHg LVOT Vmax:         131.75 cm/s LVOT Vmean:        75.250 cm/s LVOT VTI:          0.226 m LVOT/AV VTI ratio: 0.79  AORTA Ao Root diam: 3.00 cm Ao Asc diam:  2.80 cm MV E velocity: 96.74 cm/s MV A velocity: 96.74 cm/s  SHUNTS MV E/A ratio:  1.00        Systemic VTI:  0.23 m                            Systemic Diam: 2.00 cm Eleonore Chiquito MD Electronically signed by Eleonore Chiquito MD Signature Date/Time: 09/07/2019/5:06:51 PM    Final    [place] EKG SITE RITE  Result Date: 09/10/2019 If Site Rite image not attached, placement could not be confirmed due to current cardiac rhythm.  US Abdomen Limited RUQ  Result Date: 09/06/2019 CLINICAL DATA:  Elevated liver function tests EXAM: ULTRASOUND ABDOMEN LIMITED RIGHT UPPER QUADRANT COMPARISON:  None. FINDINGS: Gallbladder: The gallbladder is absent Common bile duct: Diameter: 10 mm in proximal diameter. The distal duct is obscured by overlying bowel gas. Liver: Hepatic parenchymal echogenicity is diffusely, mildly increased suggesting changes  of mild hepatic steatosis. No focal intrahepatic masses are seen. There is no intrahepatic biliary ductal dilation. Portal vein is patent on color Doppler imaging with normal direction of blood flow towards the liver. Other: No ascites IMPRESSION: Status post cholecystectomy. Mild dilation of the extrahepatic bile duct is nonspecific and may represent post cholecystectomy change. Mild hepatic steatosis Electronically Signed   By: Fidela Salisbury MD   On: 09/06/2019 20:56      Subjective: No new complaints.   Discharge Exam:  Vitals:   09/12/19 0337 09/12/19 0815  BP: 129/67 125/80  Pulse: (!) 53 (!) 50  Resp: 16 18  Temp: 97.9 F (36.6 C) 97.7 F (36.5 C)  SpO2: 99% 100%   Vitals:   09/11/19 1929 09/11/19 2339 09/12/19 0337 09/12/19 0815  BP: (!) 117/55 (!) 117/52 129/67 125/80  Pulse: 60 (!) 56 (!) 53 (!) 50  Resp: '18 19 16 18  ' Temp: 98.8 F (37.1 C) 98.1 F (36.7 C) 97.9 F (36.6 C) 97.7 F (36.5 C)  TempSrc:   Oral Oral  SpO2: 98% 100% 99% 100%  Weight:      Height:        General: Pt is alert, awake, not in acute distress Cardiovascular: RRR, S1/S2 +, no rubs, no gallops Respiratory: CTA bilaterally, no wheezing, no rhonchi Abdominal: Soft, NT, ND, bowel sounds + Extremities: no edema, no cyanosis    The results of significant diagnostics from this hospitalization (including imaging, microbiology, ancillary and laboratory) are listed below for reference.     Microbiology: Recent Results (from the past 240 hour(s))  Blood Culture (routine x 2)     Status: Abnormal   Collection Time: 09/06/19  4:17 PM   Specimen: BLOOD LEFT FOREARM  Result Value Ref Range Status   Specimen Description BLOOD LEFT FOREARM  Final   Special Requests   Final    BOTTLES DRAWN AEROBIC AND ANAEROBIC Blood Culture adequate volume   Culture  Setup Time   Final    GRAM NEGATIVE RODS IN BOTH AEROBIC AND ANAEROBIC BOTTLES Organism ID to follow CRITICAL RESULT CALLED TO, READ BACK BY AND  VERIFIED WITH: Molly Maduro PharmD 9:10 09/07/19 ([email or web address]) Performed at [facility], [place] [address] [place], [place] [address]    Culture ENTEROBACTER AEROGENES (A)  Final   Report Status 09/09/2019 FINAL  Final   Organism ID, Bacteria ENTEROBACTER AEROGENES  Final      Susceptibility   Enterobacter aerogenes - MIC*    CEFAZOLIN >=64 RESISTANT Resistant     CEFEPIME <=0.12 SENSITIVE Sensitive     CEFTAZIDIME <=1 SENSITIVE Sensitive     CEFTRIAXONE <=0.25 SENSITIVE Sensitive     CIPROFLOXACIN <=0.25 SENSITIVE Sensitive     GENTAMICIN <=1 SENSITIVE Sensitive     IMIPENEM 2 SENSITIVE Sensitive     TRIMETH/SULFA <=20 SENSITIVE Sensitive     PIP/TAZO <=4 SENSITIVE Sensitive     * ENTEROBACTER AEROGENES  Blood Culture ID Panel (Reflexed)     Status: Abnormal   Collection Time: 09/06/19  4:17 PM  Result Value Ref Range Status   Enterococcus faecalis NOT DETECTED NOT DETECTED Final   Enterococcus Faecium NOT DETECTED NOT DETECTED Final   Listeria monocytogenes NOT DETECTED NOT DETECTED Final   Staphylococcus species NOT DETECTED NOT DETECTED Final   Staphylococcus aureus (BCID) NOT DETECTED NOT DETECTED Final   Staphylococcus epidermidis NOT DETECTED NOT DETECTED Final   Staphylococcus lugdunensis NOT DETECTED NOT DETECTED Final   Streptococcus species NOT DETECTED NOT DETECTED Final   Streptococcus agalactiae NOT DETECTED NOT DETECTED Final   Streptococcus pneumoniae NOT DETECTED NOT DETECTED Final   Streptococcus pyogenes NOT DETECTED NOT DETECTED Final   A.calcoaceticus-baumannii NOT DETECTED NOT DETECTED Final   Bacteroides fragilis NOT DETECTED NOT DETECTED Final   Enterobacterales DETECTED (A) NOT DETECTED Final    Comment: Enterobacterales represent a large order of gram negative bacteria, not a single organism. CRITICAL RESULT CALLED TO, READ BACK BY AND VERIFIED WITH: Molly Maduro PharmD 9:10 09/07/19 (  [email or web address])    Enterobacter cloacae complex NOT DETECTED NOT DETECTED Final    Escherichia coli NOT DETECTED NOT DETECTED Final   Klebsiella aerogenes DETECTED (A) NOT DETECTED Final    Comment: CRITICAL RESULT CALLED TO, READ BACK BY AND VERIFIED WITH: Molly Maduro PharmD 9:10 09/07/19 ([email or web address])    Klebsiella oxytoca NOT DETECTED NOT DETECTED Final   Klebsiella pneumoniae NOT DETECTED NOT DETECTED Final   Proteus species NOT DETECTED NOT DETECTED Final   Salmonella species NOT DETECTED NOT DETECTED Final   Serratia marcescens NOT DETECTED NOT DETECTED Final   Haemophilus influenzae NOT DETECTED NOT DETECTED Final   Neisseria meningitidis NOT DETECTED NOT DETECTED Final   Pseudomonas aeruginosa NOT DETECTED NOT DETECTED Final   Stenotrophomonas maltophilia NOT DETECTED NOT DETECTED Final   Candida albicans NOT DETECTED NOT DETECTED Final   Candida auris NOT DETECTED NOT DETECTED Final   Candida glabrata NOT DETECTED NOT DETECTED Final   Candida krusei NOT DETECTED NOT DETECTED Final   Candida parapsilosis NOT DETECTED NOT DETECTED Final   Candida tropicalis NOT DETECTED NOT DETECTED Final   Cryptococcus neoformans/gattii NOT DETECTED NOT DETECTED Final   CTX-M ESBL NOT DETECTED NOT DETECTED Final   Carbapenem resistance IMP NOT DETECTED NOT DETECTED Final   Carbapenem resistance KPC NOT DETECTED NOT DETECTED Final   Carbapenem resistance NDM NOT DETECTED NOT DETECTED Final   Carbapenem resist OXA 48 LIKE NOT DETECTED NOT DETECTED Final   Carbapenem resistance VIM NOT DETECTED NOT DETECTED Final    Comment: Performed at [facility], [address], [place], [place] [address]  Urine culture     Status: None   Collection Time: 09/06/19  4:18 PM   Specimen: In/Out Cath Urine  Result Value Ref Range Status   Specimen Description IN/OUT CATH URINE  Final   Special Requests NONE  Final   Culture   Final    NO GROWTH Performed at [facility], [place] [address], [place], [place] [address]    Report Status 09/07/2019 FINAL  Final  SARS Coronavirus 2 by RT PCR  (hospital order, performed in [facility] hospital lab) Nasopharyngeal Urine, Catheterized     Status: None   Collection Time: 09/06/19  4:18 PM   Specimen: Urine, Catheterized; Nasopharyngeal  Result Value Ref Range Status   SARS Coronavirus 2 NEGATIVE NEGATIVE Final    Comment: (NOTE) SARS-CoV-2 target nucleic acids are NOT DETECTED.  The SARS-CoV-2 RNA is generally detectable in upper and lower respiratory specimens during the acute phase of infection. The lowest concentration of SARS-CoV-2 viral copies this assay can detect is 250 copies / mL. A negative result does not preclude SARS-CoV-2 infection and should not be used as the sole basis for treatment or other patient management decisions.  A negative result may occur with improper specimen collection / handling, submission of specimen other than nasopharyngeal swab, presence of viral mutation(s) within the areas targeted by this assay, and inadequate number of viral copies (<250 copies / mL). A negative result must be combined with clinical observations, patient history, and epidemiological information.  Fact Sheet for Patients:   StrictlyIdeas.no  Fact Sheet for Healthcare Providers: BankingDealers.co.za  This test is not yet approved or  cleared by the [place] FDA and has been authorized for detection and/or diagnosis of SARS-CoV-2 by FDA under an Emergency Use Authorization (EUA).  This EUA will remain in effect (meaning this test can be used) for the duration of the COVID-19 declaration under Section  564(b)(1) of the Act, 21 U.S.C. section 360bbb-3(b)(1), unless the authorization is terminated or revoked sooner.  Performed at [facility], [place] [address], [place], [place] [address]   Blood Culture (routine x 2)     Status: Abnormal   Collection Time: 09/06/19  4:21 PM   Specimen: BLOOD  Result Value Ref Range Status   Specimen Description BLOOD LEFT UPPER  ARM  Final   Special Requests   Final    BOTTLES DRAWN AEROBIC AND ANAEROBIC Blood Culture adequate volume   Culture  Setup Time   Final    GRAM NEGATIVE RODS IN BOTH AEROBIC AND ANAEROBIC BOTTLES CRITICAL VALUE NOTED.  VALUE IS CONSISTENT WITH PREVIOUSLY REPORTED AND CALLED VALUE.    Culture (A)  Final    ENTEROBACTER AEROGENES SUSCEPTIBILITIES PERFORMED ON PREVIOUS CULTURE WITHIN THE LAST 5 DAYS. Performed at [facility], [place] [address] [place], [place] [address]    Report Status 09/09/2019 FINAL  Final  Culture, blood (routine x 2)     Status: None (Preliminary result)   Collection Time: 09/09/19  7:40 AM   Specimen: BLOOD  Result Value Ref Range Status   Specimen Description BLOOD BLOOD LEFT HAND  Final   Special Requests   Final    BOTTLES DRAWN AEROBIC AND ANAEROBIC Blood Culture adequate volume   Culture   Final    NO GROWTH 3 DAYS Performed at [facility], [place] [address] [place], [place] [address]    Report Status PENDING  Incomplete  Culture, blood (routine x 2)     Status: None (Preliminary result)   Collection Time: 09/09/19  7:40 AM   Specimen: BLOOD  Result Value Ref Range Status   Specimen Description BLOOD LEFT ANTECUBITAL  Final   Special Requests   Final    BOTTLES DRAWN AEROBIC AND ANAEROBIC Blood Culture adequate volume   Culture   Final    NO GROWTH 3 DAYS Performed at [facility], [place] [address], [place], [place] [address]    Report Status PENDING  Incomplete  SARS Coronavirus 2 by RT PCR (hospital order, performed in [facility] hospital lab) Nasopharyngeal Nasopharyngeal Swab     Status: None   Collection Time: 09/12/19  1:15 PM   Specimen: Nasopharyngeal Swab  Result Value Ref Range Status   SARS Coronavirus 2 NEGATIVE NEGATIVE Final    Comment: (NOTE) SARS-CoV-2 target nucleic acids are NOT DETECTED.  The SARS-CoV-2 RNA is generally detectable in upper and lower respiratory specimens during the acute phase of infection.  The lowest concentration of SARS-CoV-2 viral copies this assay can detect is 250 copies / mL. A negative result does not preclude SARS-CoV-2 infection and should not be used as the sole basis for treatment or other patient management decisions.  A negative result may occur with improper specimen collection / handling, submission of specimen other than nasopharyngeal swab, presence of viral mutation(s) within the areas targeted by this assay, and inadequate number of viral copies (<250 copies / mL). A negative result must be combined with clinical observations, patient history, and epidemiological information.  Fact Sheet for Patients:   StrictlyIdeas.no  Fact Sheet for Healthcare Providers: BankingDealers.co.za  This test is not yet approved or  cleared by the [place] FDA and has been authorized for detection and/or diagnosis of SARS-CoV-2 by FDA under an Emergency Use Authorization (EUA).  This EUA will remain in effect (meaning this test can be used) for the duration of  the COVID-19 declaration under Section 564(b)(1) of the Act, 21 U.S.C. section 360bbb-3(b)(1), unless the authorization is terminated or revoked sooner.  Performed at [facility], [place] [address] [place], [place] [address]      Labs: BNP (last 3 results) No results for input(s): BNP in the last 8760 hours. Basic Metabolic Panel: Recent Labs  Lab 09/06/19 2035 09/07/19 0557 09/08/19 0956 09/08/19 1356 09/09/19 0502 09/10/19 0143 09/11/19 0132 09/12/19 1252  NA  --    < > 136  --  136 139 140 143  K  --    < > 2.9*  --  3.3* 3.7 3.1* 3.4*  CL  --    < > 106  --  108 109 109 112*  CO2  --    < > 19*  --  19* 20* 22 23  GLUCOSE  --    < > 116*  --  146* 109* 135* 96  BUN  --    < > 15  --  '15 12 10 12  ' CREATININE  --    < > 0.62  --  0.50 0.52 0.49 0.58  CALCIUM  --    < > 8.3*  --  8.0* 8.5* 8.5* 8.3*  MG 1.7  --   --  1.9 2.0 2.0 2.1  --     < > = values in this interval not displayed.   Liver Function Tests: Recent Labs  Lab 09/06/19 1617 09/07/19 0557 09/09/19 0502 09/10/19 0143 09/11/19 0132  AST 51* 49* 31 43* 36  ALT 46* 44 31 35 30  ALKPHOS 167* 148* 127* 124 127*  BILITOT 1.4* 0.7 0.4 0.6 0.6  PROT 5.8* 5.2* 5.2* 5.4* 5.4*  ALBUMIN 2.6* 2.3* 1.9* 2.0* 2.1*   Recent Labs  Lab 09/06/19 1800  LIPASE 16   Recent Labs  Lab 09/06/19 2258  AMMONIA 40*   CBC: Recent Labs  Lab 09/08/19 1356 09/09/19 0502 09/10/19 0143 09/11/19 0132 09/12/19 0148  WBC 4.6 3.9* 4.1 5.1 6.6  NEUTROABS 4.0 3.0 2.7 3.3 4.1  HGB 10.5* 8.9* 9.2* 8.9* 9.3*  HCT 31.9* 27.7* 28.6* 28.0* 29.2*  MCV 90.1 88.8 88.3 88.9 91.5  PLT 202 183 210 215 272   Cardiac Enzymes: No results for input(s): CKTOTAL, CKMB, CKMBINDEX, TROPONINI in the last 168 hours. BNP: Invalid input(s): POCBNP CBG: Recent Labs  Lab 09/11/19 1927 09/11/19 2339 09/12/19 0336 09/12/19 0926 09/12/19 1127  GLUCAP 240* 97 127* 194* 148*   D-Dimer No results for input(s): DDIMER in the last 72 hours. Hgb A1c No results for input(s): HGBA1C in the last 72 hours. Lipid Profile No results for input(s): CHOL, HDL, LDLCALC, TRIG, CHOLHDL, LDLDIRECT in the last 72 hours. Thyroid function studies No results for input(s): TSH, T4TOTAL, T3FREE, THYROIDAB in the last 72 hours.  Invalid input(s): FREET3 Anemia work up No results for input(s): VITAMINB12, FOLATE, FERRITIN, TIBC, IRON, RETICCTPCT in the last 72 hours. Urinalysis    Component Value Date/Time   COLORURINE AMBER (A) 09/06/2019 1618   APPEARANCEUR CLEAR 09/06/2019 1618   LABSPEC 1.019 09/06/2019 1618   PHURINE 6.0 09/06/2019 1618   GLUCOSEU NEGATIVE 09/06/2019 1618   HGBUR NEGATIVE 09/06/2019 1618   BILIRUBINUR NEGATIVE 09/06/2019 1618   KETONESUR 5 (A) 09/06/2019 1618   PROTEINUR NEGATIVE 09/06/2019 1618   NITRITE NEGATIVE 09/06/2019 1618   LEUKOCYTESUR NEGATIVE 09/06/2019 1618   Sepsis  Labs Invalid input(s): PROCALCITONIN,  WBC,  LACTICIDVEN Microbiology Recent Results (from the past  240 hour(s))  Blood Culture (routine x 2)     Status: Abnormal   Collection Time: 09/06/19  4:17 PM   Specimen: BLOOD LEFT FOREARM  Result Value Ref Range Status   Specimen Description BLOOD LEFT FOREARM  Final   Special Requests   Final    BOTTLES DRAWN AEROBIC AND ANAEROBIC Blood Culture adequate volume   Culture  Setup Time   Final    GRAM NEGATIVE RODS IN BOTH AEROBIC AND ANAEROBIC BOTTLES Organism ID to follow CRITICAL RESULT CALLED TO, READ BACK BY AND VERIFIED WITH: Molly Maduro PharmD 9:10 09/07/19 ([email or web address]) Performed at [facility], [place] [address] [place], [place] [address]    Culture ENTEROBACTER AEROGENES (A)  Final   Report Status 09/09/2019 FINAL  Final   Organism ID, Bacteria ENTEROBACTER AEROGENES  Final      Susceptibility   Enterobacter aerogenes - MIC*    CEFAZOLIN >=64 RESISTANT Resistant     CEFEPIME <=0.12 SENSITIVE Sensitive     CEFTAZIDIME <=1 SENSITIVE Sensitive     CEFTRIAXONE <=0.25 SENSITIVE Sensitive     CIPROFLOXACIN <=0.25 SENSITIVE Sensitive     GENTAMICIN <=1 SENSITIVE Sensitive     IMIPENEM 2 SENSITIVE Sensitive     TRIMETH/SULFA <=20 SENSITIVE Sensitive     PIP/TAZO <=4 SENSITIVE Sensitive     * ENTEROBACTER AEROGENES  Blood Culture ID Panel (Reflexed)     Status: Abnormal   Collection Time: 09/06/19  4:17 PM  Result Value Ref Range Status   Enterococcus faecalis NOT DETECTED NOT DETECTED Final   Enterococcus Faecium NOT DETECTED NOT DETECTED Final   Listeria monocytogenes NOT DETECTED NOT DETECTED Final   Staphylococcus species NOT DETECTED NOT DETECTED Final   Staphylococcus aureus (BCID) NOT DETECTED NOT DETECTED Final   Staphylococcus epidermidis NOT DETECTED NOT DETECTED Final   Staphylococcus lugdunensis NOT DETECTED NOT DETECTED Final   Streptococcus species NOT DETECTED NOT DETECTED Final   Streptococcus agalactiae NOT DETECTED  NOT DETECTED Final   Streptococcus pneumoniae NOT DETECTED NOT DETECTED Final   Streptococcus pyogenes NOT DETECTED NOT DETECTED Final   A.calcoaceticus-baumannii NOT DETECTED NOT DETECTED Final   Bacteroides fragilis NOT DETECTED NOT DETECTED Final   Enterobacterales DETECTED (A) NOT DETECTED Final    Comment: Enterobacterales represent a large order of gram negative bacteria, not a single organism. CRITICAL RESULT CALLED TO, READ BACK BY AND VERIFIED WITH: Molly Maduro PharmD 9:10 09/07/19 ([email or web address])    Enterobacter cloacae complex NOT DETECTED NOT DETECTED Final   Escherichia coli NOT DETECTED NOT DETECTED Final   Klebsiella aerogenes DETECTED (A) NOT DETECTED Final    Comment: CRITICAL RESULT CALLED TO, READ BACK BY AND VERIFIED WITH: Molly Maduro PharmD 9:10 09/07/19 ([email or web address])    Klebsiella oxytoca NOT DETECTED NOT DETECTED Final   Klebsiella pneumoniae NOT DETECTED NOT DETECTED Final   Proteus species NOT DETECTED NOT DETECTED Final   Salmonella species NOT DETECTED NOT DETECTED Final   Serratia marcescens NOT DETECTED NOT DETECTED Final   Haemophilus influenzae NOT DETECTED NOT DETECTED Final   Neisseria meningitidis NOT DETECTED NOT DETECTED Final   Pseudomonas aeruginosa NOT DETECTED NOT DETECTED Final   Stenotrophomonas maltophilia NOT DETECTED NOT DETECTED Final   Candida albicans NOT DETECTED NOT DETECTED Final   Candida auris NOT DETECTED NOT DETECTED Final   Candida glabrata NOT DETECTED NOT DETECTED Final   Candida krusei NOT DETECTED NOT DETECTED Final   Candida parapsilosis NOT DETECTED NOT DETECTED Final   Candida tropicalis NOT DETECTED NOT  DETECTED Final   Cryptococcus neoformans/gattii NOT DETECTED NOT DETECTED Final   CTX-M ESBL NOT DETECTED NOT DETECTED Final   Carbapenem resistance IMP NOT DETECTED NOT DETECTED Final   Carbapenem resistance KPC NOT DETECTED NOT DETECTED Final   Carbapenem resistance NDM NOT DETECTED NOT DETECTED Final   Carbapenem resist OXA 48 LIKE  NOT DETECTED NOT DETECTED Final   Carbapenem resistance VIM NOT DETECTED NOT DETECTED Final    Comment: Performed at [facility], [place] [address] [place], [place] [address]  Urine culture     Status: None   Collection Time: 09/06/19  4:18 PM   Specimen: In/Out Cath Urine  Result Value Ref Range Status   Specimen Description IN/OUT CATH URINE  Final   Special Requests NONE  Final   Culture   Final    NO GROWTH Performed at [facility], [place] [address], [place], [place] [address]    Report Status 09/07/2019 FINAL  Final  SARS Coronavirus 2 by RT PCR (hospital order, performed in [facility] hospital lab) Nasopharyngeal Urine, Catheterized     Status: None   Collection Time: 09/06/19  4:18 PM   Specimen: Urine, Catheterized; Nasopharyngeal  Result Value Ref Range Status   SARS Coronavirus 2 NEGATIVE NEGATIVE Final    Comment: (NOTE) SARS-CoV-2 target nucleic acids are NOT DETECTED.  The SARS-CoV-2 RNA is generally detectable in upper and lower respiratory specimens during the acute phase of infection. The lowest concentration of SARS-CoV-2 viral copies this assay can detect is 250 copies / mL. A negative result does not preclude SARS-CoV-2 infection and should not be used as the sole basis for treatment or other patient management decisions.  A negative result may occur with improper specimen collection / handling, submission of specimen other than nasopharyngeal swab, presence of viral mutation(s) within the areas targeted by this assay, and inadequate number of viral copies (<250 copies / mL). A negative result must be combined with clinical observations, patient history, and epidemiological information.  Fact Sheet for Patients:   StrictlyIdeas.no  Fact Sheet for Healthcare Providers: BankingDealers.co.za  This test is not yet approved or  cleared by the [place] FDA and has been authorized for detection and/or  diagnosis of SARS-CoV-2 by FDA under an Emergency Use Authorization (EUA).  This EUA will remain in effect (meaning this test can be used) for the duration of the COVID-19 declaration under Section 564(b)(1) of the Act, 21 U.S.C. section 360bbb-3(b)(1), unless the authorization is terminated or revoked sooner.  Performed at [facility], [place] [address], [place], [place] [address]   Blood Culture (routine x 2)     Status: Abnormal   Collection Time: 09/06/19  4:21 PM   Specimen: BLOOD  Result Value Ref Range Status   Specimen Description BLOOD LEFT UPPER ARM  Final   Special Requests   Final    BOTTLES DRAWN AEROBIC AND ANAEROBIC Blood Culture adequate volume   Culture  Setup Time   Final    GRAM NEGATIVE RODS IN BOTH AEROBIC AND ANAEROBIC BOTTLES CRITICAL VALUE NOTED.  VALUE IS CONSISTENT WITH PREVIOUSLY REPORTED AND CALLED VALUE.    Culture (A)  Final    ENTEROBACTER AEROGENES SUSCEPTIBILITIES PERFORMED ON PREVIOUS CULTURE WITHIN THE LAST 5 DAYS. Performed at [facility], [place] [address] [place], [place] [address]    Report Status 09/09/2019 FINAL  Final  Culture, blood (routine x 2)     Status: None (Preliminary result)  Collection Time: 09/09/19  7:40 AM   Specimen: BLOOD  Result Value Ref Range Status   Specimen Description BLOOD BLOOD LEFT HAND  Final   Special Requests   Final    BOTTLES DRAWN AEROBIC AND ANAEROBIC Blood Culture adequate volume   Culture   Final    NO GROWTH 3 DAYS Performed at [facility], [address] [place], [place] [address]    Report Status PENDING  Incomplete  Culture, blood (routine x 2)     Status: None (Preliminary result)   Collection Time: 09/09/19  7:40 AM   Specimen: BLOOD  Result Value Ref Range Status   Specimen Description BLOOD LEFT ANTECUBITAL  Final   Special Requests   Final    BOTTLES DRAWN AEROBIC AND ANAEROBIC Blood Culture adequate volume   Culture   Final    NO GROWTH 3 DAYS Performed at [facility], [place] [address], [place], [place] [address]    Report Status PENDING  Incomplete  SARS Coronavirus 2 by RT PCR (hospital order, performed in [facility] hospital lab) Nasopharyngeal Nasopharyngeal Swab     Status: None   Collection Time: 09/12/19  1:15 PM   Specimen: Nasopharyngeal Swab  Result Value Ref Range Status   SARS Coronavirus 2 NEGATIVE NEGATIVE Final    Comment: (NOTE) SARS-CoV-2 target nucleic acids are NOT DETECTED.  The SARS-CoV-2 RNA is generally detectable in upper and lower respiratory specimens during the acute phase of infection. The lowest concentration of SARS-CoV-2 viral copies this assay can detect is 250 copies / mL. A negative result does not preclude SARS-CoV-2 infection and should not be used as the sole basis for treatment or other patient management decisions.  A negative result may occur with improper specimen collection / handling, submission of specimen other than nasopharyngeal swab, presence of viral mutation(s) within the areas targeted by this assay, and inadequate number of viral copies (<250 copies / mL). A negative result must be combined with clinical observations, patient history, and epidemiological information.  Fact Sheet for Patients:   StrictlyIdeas.no  Fact Sheet for Healthcare Providers: BankingDealers.co.za  This test is not yet approved or  cleared by the [place] FDA and has been authorized for detection and/or diagnosis of SARS-CoV-2 by FDA under an Emergency Use Authorization (EUA).  This EUA will remain in effect (meaning this test can be used) for the duration of the COVID-19 declaration under Section 564(b)(1) of the Act, 21 U.S.C. section 360bbb-3(b)(1), unless the authorization is terminated or revoked sooner.  Performed at [facility], [place] [address], [place], [place] [address]      Time coordinating discharge: 38 minutes.   SIGNED:   Hosie Poisson, MD  [facility] 09/12/2019, 3:24 PM

## 2019-09-12 NOTE — Progress Notes (Signed)
PHARMACY CONSULT NOTE FOR:  OUTPATIENT  PARENTERAL ANTIBIOTIC THERAPY (OPAT)  Indication: Bacteremia Regimen: Cefepime 2g q8h End date: 09/22/19  IV antibiotic discharge orders are pended. To discharging provider:  please sign these orders via discharge navigator,  Select New Orders & click on the button choice - Manage This Unsigned Work.    Thank you for allowing pharmacy to be a part of this patient's care.  Alfonse Spruce, PharmD PGY2 ID Pharmacy Resident [phone]  09/12/2019, 9:35 AM

## 2019-09-12 NOTE — Progress Notes (Signed)
SLP Cancellation Note  Patient Details Name: Samantha Aguirre MRN: [id number] DOB: February 16, 1945   Cancelled treatment:       Reason Eval/Treat Not Completed: Patient at procedure or test/unavailable.  Pt was getting PICC line placed at this time per RN report.  SLP will f/u for tx as schedule allows.     Elvia Collum Burnice Oestreicher 09/12/2019, 10:46 AM

## 2019-09-12 NOTE — Plan of Care (Signed)
  Problem: Clinical Measurements: Goal: Will remain free from infection Outcome: Progressing   Problem: Activity: Goal: Risk for activity intolerance will decrease Outcome: Progressing   Problem: Nutrition: Goal: Adequate nutrition will be maintained Outcome: Progressing   Problem: Pain Managment: Goal: General experience of comfort will improve Outcome: Progressing   

## 2019-09-12 NOTE — Progress Notes (Signed)
Went to patient room to place PICC line. Explained to patient the procedure and able to assessed vein. After finished assessing vein patient refused for a PICC placement and PIV start. RN at bedside, patient still refused despite of explaination. Will follow up.

## 2019-09-12 NOTE — Progress Notes (Signed)
  Speech Language Pathology Treatment: Dysphagia  Patient Details Name: Samantha Aguirre MRN: [id number] DOB: 08-14-1945 Today's Date: 09/12/2019 Time: 6301-6010 SLP Time Calculation (min) (ACUTE ONLY): 18 min  Assessment / Plan / Recommendation Clinical Impression  Pt was seen for skilled ST targeting diet tolerance and diagnostic treatment.  Pt was encountered awake/alert, trying to get out of bed.  She appeared to be confused and was mildly agitated upon arrival.  RN reported that she had refused PICC line placement just prior to this tx session.  Pt was seen with trials of thin liquid and puree.  AP transport and swallow initiation appeared to be timely with all trials, and no overt s/sx of aspiration were observed during this session.  She refused ground, soft, and regular solid trials despite verbal encouragement.  Black coating was noted on the pt's lingual surface and RN was made aware.  Recommend continuation of Dysphagia 1 (puree) solids and thin liquids at this time.  SLP will f/u for diagnostic treatment per POC.     HPI HPI: Pt is a 74 yo female with recent fall, presenting from SNF with fever and AMS. She is admitted with sepsis of unclear source (CXR, CT abdomen, urine analysis negative). PMH includes: afib, CHF, COPD, asthma, DMII, HTN, HLD, seizure disorder, CVA, hypothyroidism, CAD, GERD, recent R shoulder arthroplasty on 08/25/19. Pt has had several previous swallow evaluations with functional appearing swallow with recommendations for mechanical soft to regular solids based on pt preference.      SLP Plan  Continue with current plan of care       Recommendations  Diet recommendations: Dysphagia 1 (puree);Thin liquid Liquids provided via: Cup;Straw Medication Administration: Crushed with puree Supervision: Patient able to self feed;Intermittent supervision to cue for compensatory strategies Compensations: Minimize environmental distractions;Slow rate;Small sips/bites                 Oral Care Recommendations: Oral care QID Follow up Recommendations: Skilled Nursing facility SLP Visit Diagnosis: Dysphagia, unspecified (R13.10) Plan: Continue with current plan of care                      Colin Mulders M.S., [place] Office: [phone]  [place] 09/12/2019, 12:00 PM

## 2019-09-12 NOTE — Progress Notes (Signed)
Per secure chat with Dr. Retia Passe, Ok to place midline.

## 2019-09-14 LAB — CULTURE, BLOOD (ROUTINE X 2)
Culture: NO GROWTH
Culture: NO GROWTH
Special Requests: ADEQUATE
Special Requests: ADEQUATE

## 2019-09-16 ENCOUNTER — Other Ambulatory Visit: Payer: Self-pay

## 2019-09-16 ENCOUNTER — Non-Acute Institutional Stay: Payer: Medicare Other | Admitting: Primary Care

## 2019-09-16 DIAGNOSIS — F0150 Vascular dementia without behavioral disturbance: Secondary | ICD-10-CM

## 2019-09-16 DIAGNOSIS — Z515 Encounter for palliative care: Secondary | ICD-10-CM

## 2019-09-16 DIAGNOSIS — J441 Chronic obstructive pulmonary disease with (acute) exacerbation: Secondary | ICD-10-CM

## 2019-09-16 NOTE — Progress Notes (Signed)
[facility] Pall[facility] Consult Note Telephone: [phone]  Fax: [phone]  PATIENT NAME: Samantha Aguirre [address] [place] [address] [place] [address] [phone] (home)  DOB: 1945-10-06 MRN: [id number]  PRIMARY CARE PROVIDER:    Gennie Alma, [place] [address]  REFERRING PROVIDER:   Gennie Alma, [place],  [place] [address]  RESPONSIBLE PARTY:   Extended Emergency Contact Information Primary Emergency Contact: Keaira, [place], [place] [address] Johnnette Litter of [employer] Phone: [phone] Relation: Daughter Secondary Emergency Contact: Rane, [place] Home Phone: [phone] Relation: Son  I met face to face with patient in facility.  ASSESSMENT AND RECOMMENDATIONS:   1. Advance Care Planning/Goals of Care: Goals include to maximize quality of life and symptom management. I spoke with patient's daughter Jelissa after my SNF visit.  Our advance care planning conversation included a discussion about:     The value and importance of advance care planning   Experiences with loved ones who have been seriously ill or have died   Exploration of personal, cultural or spiritual beliefs that might influence medical decisions   Exploration of goals of care in the event of a sudden injury or illness   Caregiving issues for after d/c from SNF.  DNR on file at SNF and is in [facility].  2. Symptom Management:   Patient visited in SNF where she is for rehab after shoulder surgery and sepsis. Staff states they met her on another admission and endorse significant cognitive decline.  Her safety may be an issue once she is dc from facility. Daughter wants patient to be in her own home. I will continue to follow for management of dementia behaviors, and for goals of care.  3. Follow up Palliative Care Visit: Palliative care will continue to follow for goals of care clarification and  symptom management. Return 2-4 weeks or prn.  4. Family /Caregiver/Community Supports: Daughter is [place] and involved in care, trying to find out options for taking pt home. Gave resources of DSS, Museum/gallery curator and [facility] for exploring home services. Daughter will follow up with calling for resources for home care.   5. Cognitive / Functional decline: A and O x 1, does not know date. Cannot remember children's names. Ambulates, feeds self. Needs cueing.  I spent 45 minutes providing this consultation,  from 1000 to 1045. More than 50% of the time in this consultation was spent coordinating communication.   CHIEF COMPLAINT: dementia, confusion  HISTORY OF PRESENT ILLNESS:  Samantha Aguirre is a 74 y.o. year old female with multiple medical problems including sepsis, dementia with behavior disturbances, COPD, falls.  Palliative Care was asked to follow this patient by consultation request of Gennie Alma, MD  to help address advance care planning and goals of care. This is an initial visit.  CODE STATUS: DNR on file  PPS: 50%  HOSPICE ELIGIBILITY/DIAGNOSIS: TBD  PAST MEDICAL HISTORY:  Past Medical History:  Diagnosis Date  . Acid reflux   . Arthritis    Bilateral knees, hands, feet  . Asthma   . Atrial fibrillation ([facility])   . Cancer ([facility])   . CHF (congestive heart failure) ([facility])    Diastolic CHF  . COPD (chronic obstructive pulmonary disease) ([facility])   . Coronary artery disease   . Diabetes mellitus without complication ([facility])   . Frequent headaches   . GI bleed   . HLD (  hyperlipidemia)   . Hypertension   . Seizures ([facility])   . Skin cancer 2015   Suspected basal cell carcinoma on nose, surgically removed  . Stroke ([facility]) 04/2017  . Thyroid disease   . Tremors of nervous system     SOCIAL HX:  Social History   Tobacco Use  . Smoking status: Current Every Day Smoker    Packs/day: 1.00    Years: 53.00    Pack years: 53.00    Types: Cigarettes  . Smokeless tobacco: Never  Used  Substance Use Topics  . Alcohol use: No   FAMILY HX:  Family History  Problem Relation Age of Onset  . Breast cancer Mother   . Heart disease Mother   . Stroke Mother   . Cancer Mother   . COPD Mother   . Diabetes Mother   . Heart disease Father   . Diabetes Father   . Stroke Father   . Alcohol abuse Sister   . Drug abuse Sister   . Stroke Sister   . Cancer Sister   . Mental illness Sister   . Heart disease Brother   . Arthritis Brother   . Diabetes Brother   . Heart disease Maternal Grandfather   . Heart disease Paternal Grandfather     ALLERGIES:  Allergies  Allergen Reactions  . Ivp Dye [Iodinated Diagnostic Agents] Itching    Pt injected with IV contrast.  5 min after injection pt complained of itching behind ear and on her abd.  . Methotrexate Rash  . Morphine And Related Other (See Comments)    "stops my breathing" NEAR-RESPIRATORY ARREST  . Mushroom Extract Complex Anaphylaxis    Made patient "deathly sick"  . Atenolol Other (See Comments)    "MD took me off of it bc it was doing something wrong" Made patient HYPOTENSIVE  . Percocet [Oxycodone-Acetaminophen] Nausea And Vomiting and Other (See Comments)    Stomach pains  . Percodan [Oxycodone-Aspirin] Nausea And Vomiting and Other (See Comments)    Stomach pains  . Tramadol Nausea Only  . Verapamil Other (See Comments)    " MD told me this was screwing me up" MAKES PATIENT HYPOTENSIVE  . Oxycodone Hcl      PERTINENT MEDICATIONS:  Outpatient Encounter Medications as of 09/16/2019  Medication Sig  . albuterol (PROVENTIL HFA;VENTOLIN HFA) 108 (90 Base) MCG/ACT inhaler Inhale 2 puffs into the lungs every 6 (six) hours as needed for wheezing or shortness of breath.  Marland Kitchen aspirin (ASPIRIN CHILDRENS) 81 MG chewable tablet Chew 1 tablet (81 mg total) by mouth daily.  Marland Kitchen atorvastatin (LIPITOR) 80 MG tablet Take 1 tablet (80 mg total) by mouth daily at 6 PM.  . ceFEPime (MAXIPIME) IVPB Inject 2 g into the vein  every 8 (eight) hours for 10 days. Indication:  Bacteremia First Dose: Yes Last Day of Therapy:  9/9 Labs - Once weekly:  CBC/D and BMP, Labs - Every other week:  ESR and CRP Method of administration: IV Push Method of administration may be changed at the discretion of home infusion pharmacist based upon assessment of the patient and/or caregiver's ability to self-administer the medication ordered.  . clopidogrel (PLAVIX) 75 MG tablet Take 75 mg by mouth daily.  . DULoxetine (CYMBALTA) 60 MG capsule Take 1 capsule (60 mg total) by mouth at bedtime. (Patient taking differently: Take 60 mg by mouth daily. )  . ferrous sulfate 325 (65 FE) MG EC tablet Take 1 tablet (325 mg total) by mouth 2 (  two) times daily with a meal. (Patient taking differently: Take 325 mg by mouth daily. )  . fluticasone (FLONASE) 50 MCG/ACT nasal spray Place 1 spray into both nostrils 2 (two) times daily as needed for allergies.   . folic acid (FOLVITE) 1 MG tablet Take 1 tablet (1 mg total) by mouth daily.  . furosemide (LASIX) 20 MG tablet Take 20 mg by mouth daily.   Marland Kitchen gabapentin (NEURONTIN) 800 MG tablet Take 1 tablet (800 mg total) by mouth 2 (two) times daily.  Marland Kitchen glipiZIDE-metformin (METAGLIP) 5-500 MG tablet Take 1 tablet by mouth 2 (two) times daily before a meal.  . levETIRAcetam (KEPPRA) 500 MG tablet Take 1 tablet (500 mg total) by mouth 2 (two) times daily.  Marland Kitchen levothyroxine (SYNTHROID) 125 MCG tablet Take 125 mcg by mouth daily before breakfast.  . Multiple Vitamin-Folic Acid TABS Take 1 tablet by mouth daily.  . nitroGLYCERIN (NITROSTAT) 0.4 MG SL tablet Place 1 tablet (0.4 mg total) under the tongue every 5 (five) minutes as needed for chest pain.  Marland Kitchen omeprazole (PRILOSEC) 20 MG capsule Take 20 mg by mouth daily.   Marland Kitchen topiramate (TOPAMAX) 25 MG tablet Take 25 mg by mouth 2 (two) times daily.  . vitamin B-12 (CYANOCOBALAMIN) 1000 MCG tablet Take 1,000 mcg by mouth daily.   No facility-administered encounter  medications on file as of 09/16/2019.    PHYSICAL EXAM / ROS:   Current and past weights: 167 lbs, 168.8 lbs General: NAD, frail appearing, WNWD Cardiovascular: no chest pain reported, no edema  Pulmonary: no cough, no increased SOB, room air, former smoker  Abdomen: appetite good, denies constipation, incontinent of bowel GU: denies dysuria, incontinent of urine at times MSK: +o joint and ROM abnormalities, ambulatory with hemi walker with supervision, high fall risk Skin: PICC line in left brachial artery, healed surgical site right shoulder Neurological: Weakness, denies pain, A and O x 1-2. States date is January 3, no year.   Jason Coop, NP , DNP, MPH, [facility]  COVID-19 PATIENT SCREENING TOOL  Person answering questions: ___________staff ______ _____   1.  Is the patient or any family member in the home showing any signs or symptoms regarding respiratory infection?               Person with Symptom- __________NA_________________  a. Fever                                                                          Yes___ No___          ___________________  b. Shortness of breath                                                    Yes___ No___          ___________________ c. Cough/congestion                                       Yes___  No___  ___________________ d. Body aches/pains                                                         Yes___ No___        ____________________ e. Gastrointestinal symptoms (diarrhea, nausea)           Yes___ No___        ____________________  2. Within the past 14 days, has anyone living in the home had any contact with someone with or under investigation for COVID-19?    Yes___ No_X_   Person __________________

## 2019-09-27 ENCOUNTER — Other Ambulatory Visit: Payer: Self-pay

## 2019-09-27 ENCOUNTER — Encounter: Payer: Self-pay | Admitting: Infectious Diseases

## 2019-09-27 ENCOUNTER — Ambulatory Visit (INDEPENDENT_AMBULATORY_CARE_PROVIDER_SITE_OTHER): Payer: Medicare Other | Admitting: Infectious Diseases

## 2019-09-27 VITALS — BP 127/68 | HR 61 | Temp 98.9°F

## 2019-09-27 DIAGNOSIS — R7881 Bacteremia: Secondary | ICD-10-CM | POA: Diagnosis not present

## 2019-09-27 NOTE — Progress Notes (Signed)
[facility]                                                             [place], [place], [place], [address]                                                                  Phn. [phone]; Fax: [phone]                                                                             Date: 09/27/19   Reason for Referral: Bacteremia Referring Provider: Gennie Alma   Assessment/Plan 1. Klebsiella arogenes bacteremia with Unclear source - Patient has completed 2 weeks of IV cefepime as initially planned on 9/9. PICC line has been removed. The surgical site of the Rt revere shoulder arthroplasty appears to be healing well. Her pain has been chronic issue. She is going to follow up with her PCP and Ortho.  - F/u as needed.   All questions and concerns were discussed. Patient and sister in law verbalised understanding regarding the plan of care.   Rosiland Oz, MD [facility]  Office phone [phone] Fax no. [phone] ______________________________________________________________________________________________________________________  HPI: 74 year old female with multiple comorbidities including P AF, COPD, CHF, CVA, type II DM, seizure disorder thyroid disease, status post total reverse right shoulder arthroplasty on August 25, 2019 and recent hospital admission for Klebsiella aerogenes bacteremia ( 8/24-8/30) who is here for hospital follow-up.  Patient is accompanied by her sister-in-law.  She is sitting in a wheelchair.  Patient and sister-in-law confirms IV antibiotics were completed this past Saturday.  PICC line has been removed.  She did not have any side effects with antibiotics like nausea vomiting and diarrhea.  She still has some pain in her right shoulder however denies any redness tenderness or swelling.  Denies any drainage from that area.  The  surgical site also appears to be healing well.  She is currently living with her sister-in-law.  Overall she is doing well and she does not have any complaints today.  ROS: Constitutional: Negative for fever, chills, activity change, appetite change, fatigue and unexpected weight change.  HENT: Negative for congestion, sore throat, rhinorrhea, sneezing, trouble swallowing and sinus pressure.  Eyes: Negative for photophobia and visual disturbance.  Respiratory: Negative for cough, chest tightness, shortness of breath, wheezing and stridor.  Cardiovascular: Negative for chest pain, palpitations and leg swelling.  Gastrointestinal: Negative for nausea, vomiting, abdominal pain, diarrhea, constipation, blood in stool, abdominal distention and anal bleeding.  Genitourinary: negative  for dysuria, hematuria, flank pain and difficulty urinating.  Musculoskeletal: Negative for myalgias, back pain, joint swelling, arthralgias and gait problem.  Skin: Negative for color change, pallor, rash and wound.  Neurological:  Negative for dizziness, tremors, weakness and light-headedness.  Hematological: Negative for adenopathy. Psychiatric/Behavioral: Negative for behavioral problems, sleep disturbance, dysphoric mood, decreased concentration and agitation.   Past Medical History:  Diagnosis Date  . Acid reflux   . Arthritis    Bilateral knees, hands, feet  . Asthma   . Atrial fibrillation ([place])   . Cancer ([place])   . CHF (congestive heart failure) (HCC)    Diastolic CHF  . COPD (chronic obstructive pulmonary disease) ([place])   . Coronary artery disease   . Diabetes mellitus without complication ([place])   . Frequent headaches   . GI bleed   . HLD (hyperlipidemia)   . Hypertension   . Seizures ([place])   . Skin cancer 2015   Suspected basal cell carcinoma on nose, surgically removed  . Stroke ([place]) 04/2017  . Thyroid disease   . Tremors of nervous system    Past Surgical History:  Procedure Laterality  Date  . ABDOMINAL HYSTERECTOMY  1976  . APPENDECTOMY  1980  . BACK SURGERY    . cardiac stents    . CAROTID ENDARTERECTOMY    . CHOLECYSTECTOMY  1980  . COLONOSCOPY N/A 02/20/2017   Procedure: COLONOSCOPY;  Surgeon: Lin Landsman, MD;  Location: [facility] ENDOSCOPY;  Service: Gastroenterology;  Laterality: N/A;  . ESOPHAGOGASTRODUODENOSCOPY N/A 02/20/2017   Procedure: ESOPHAGOGASTRODUODENOSCOPY (EGD);  Surgeon: Lin Landsman, MD;  Location: [facility] ENDOSCOPY;  Service: Gastroenterology;  Laterality: N/A;  . ESOPHAGOGASTRODUODENOSCOPY (EGD) WITH PROPOFOL N/A 07/08/2017   Procedure: ESOPHAGOGASTRODUODENOSCOPY (EGD) WITH PROPOFOL;  Surgeon: [name], MD;  Location: [facility] ENDOSCOPY;  Service: Endoscopy;  Laterality: N/A;  . [place]  . GIVENS CAPSULE STUDY N/A 07/09/2017   Procedure: GIVENS CAPSULE STUDY;  Surgeon: Jonathon Bellows, MD;  Location: [facility] ENDOSCOPY;  Service: Gastroenterology;  Laterality: N/A;  . KNEE SURGERY     left  . KYPHOPLASTY N/A 03/16/2018   Procedure: KYPHOPLASTY, L5;  Surgeon: Hessie Knows, MD;  Location: [facility] ORS;  Service: Orthopedics;  Laterality: N/A;  . REVERSE SHOULDER ARTHROPLASTY Right 08/25/2019   Procedure: REVERSE SHOULDER ARTHROPLASTY;  Surgeon: Corky Mull, MD;  Location: [facility] ORS;  Service: Orthopedics;  Laterality: Right;  . ROTATOR CUFF REPAIR     right  . TONSILLECTOMY  1950   Current Outpatient Medications on File Prior to Visit  Medication Sig Dispense Refill  . albuterol (PROVENTIL HFA;VENTOLIN HFA) 108 (90 Base) MCG/ACT inhaler Inhale 2 puffs into the lungs every 6 (six) hours as needed for wheezing or shortness of breath. 3 Inhaler 1  . aspirin (ASPIRIN CHILDRENS) 81 MG chewable tablet Chew 1 tablet (81 mg total) by mouth daily. 120 tablet 0  . atorvastatin (LIPITOR) 80 MG tablet Take 1 tablet (80 mg total) by mouth daily at 6 PM. 90 tablet 3  . clopidogrel (PLAVIX) 75 MG tablet Take 75 mg by mouth daily.    . DULoxetine  (CYMBALTA) 60 MG capsule Take 1 capsule (60 mg total) by mouth at bedtime. (Patient taking differently: Take 60 mg by mouth daily. ) 30 capsule 11  . ferrous sulfate 325 (65 FE) MG EC tablet Take 1 tablet (325 mg total) by mouth 2 (two) times daily with a meal. (Patient taking differently: Take 325 mg by mouth daily. ) 60 tablet 0  . fluticasone (FLONASE) 50 MCG/ACT nasal spray Place 1 spray into both nostrils 2 (two) times daily as needed for allergies.     . folic acid (FOLVITE) 1 MG tablet Take 1  tablet (1 mg total) by mouth daily.    . furosemide (LASIX) 20 MG tablet Take 20 mg by mouth daily.     Marland Kitchen gabapentin (NEURONTIN) 800 MG tablet Take 1 tablet (800 mg total) by mouth 2 (two) times daily. 180 tablet 1  . glipiZIDE-metformin (METAGLIP) 5-500 MG tablet Take 1 tablet by mouth 2 (two) times daily before a meal. 180 tablet 1  . levETIRAcetam (KEPPRA) 500 MG tablet Take 1 tablet (500 mg total) by mouth 2 (two) times daily. 60 tablet 0  . levothyroxine (SYNTHROID) 125 MCG tablet Take 125 mcg by mouth daily before breakfast.    . Multiple Vitamin-Folic Acid TABS Take 1 tablet by mouth daily.    . nitroGLYCERIN (NITROSTAT) 0.4 MG SL tablet Place 1 tablet (0.4 mg total) under the tongue every 5 (five) minutes as needed for chest pain. 30 tablet 0  . omeprazole (PRILOSEC) 20 MG capsule Take 20 mg by mouth daily.   3  . topiramate (TOPAMAX) 25 MG tablet Take 25 mg by mouth 2 (two) times daily.    . vitamin B-12 (CYANOCOBALAMIN) 1000 MCG tablet Take 1,000 mcg by mouth daily.     No current facility-administered medications on file prior to visit.   Allergies  Allergen Reactions  . Ivp Dye [Iodinated Diagnostic Agents] Itching    Pt injected with IV contrast.  5 min after injection pt complained of itching behind ear and on her abd.  . Methotrexate Rash  . Morphine And Related Other (See Comments)    "stops my breathing" NEAR-RESPIRATORY ARREST  . Mushroom Extract Complex Anaphylaxis    Made  patient "deathly sick"  . Atenolol Other (See Comments)    "MD took me off of it bc it was doing something wrong" Made patient HYPOTENSIVE  . Percocet [Oxycodone-Acetaminophen] Nausea And Vomiting and Other (See Comments)    Stomach pains  . Percodan [Oxycodone-Aspirin] Nausea And Vomiting and Other (See Comments)    Stomach pains  . Tramadol Nausea Only  . Verapamil Other (See Comments)    " MD told me this was screwing me up" MAKES PATIENT HYPOTENSIVE  . Oxycodone Hcl    Social History   Socioeconomic History  . Marital status: Divorced    Spouse name: Not on file  . Number of children: 2  . Years of education: 85  . Highest education level: 12th grade  Occupational History  . Occupation: Disabled  Tobacco Use  . Smoking status: Former Smoker    Packs/day: 1.00    Years: 53.00    Pack years: 53.00    Types: Cigarettes  . Smokeless tobacco: Never Used  Vaping Use  . Vaping Use: Former  Substance and Sexual Activity  . Alcohol use: No  . Drug use: No  . Sexual activity: Not Currently  Other Topics Concern  . Not on file  Social History Narrative   None   Social Determinants of Health   Financial Resource Strain:   . Difficulty of Paying Living Expenses: Not on file  Food Insecurity:   . Worried About Charity fundraiser in the Last Year: Not on file  . Ran Out of Food in the Last Year: Not on file  Transportation Needs:   . Lack of Transportation (Medical): Not on file  . Lack of Transportation (Non-Medical): Not on file  Physical Activity:   . Days of Exercise per Week: Not on file  . Minutes of Exercise per Session: Not on file  Stress:   .  Feeling of Stress : Not on file  Social Connections:   . Frequency of Communication with Friends and Family: Not on file  . Frequency of Social Gatherings with Friends and Family: Not on file  . Attends Religious Services: Not on file  . Active Member of Clubs or Organizations: Not on file  . Attends Theatre manager Meetings: Not on file  . Marital Status: Not on file  Intimate Partner Violence:   . Fear of Current or Ex-Partner: Not on file  . Emotionally Abused: Not on file  . Physically Abused: Not on file  . Sexually Abused: Not on file     Vitals BP 127/68   Pulse 61   Temp 98.9 F (37.2 C) (Oral)    Examination  General - not in acute distress, comfortably sitting in a wheelchair HEENT - PEERLA, no pallor and no icterus Chest - b/l clear air entry, no additional sounds CVS- Normal s1s2, Irregular rhythm Abdomen - Soft, Non tender , non distended Ext- no pedal edema Neuro: grossly normal Back - WNL Skin- minimal bruising at the site of previous PICC line  Psych : calm and cooperative   Recent labs CBC Latest Ref Rng & Units 09/12/2019 09/11/2019 09/10/2019  WBC 4.0 - 10.5 K/uL 6.6 5.1 4.1  Hemoglobin 12.0 - 15.0 g/dL 9.3(L) 8.9(L) 9.2(L)  Hematocrit 36 - 46 % 29.2(L) 28.0(L) 28.6(L)  Platelets 150 - 400 K/uL 272 215 210   CMP Latest Ref Rng & Units 09/12/2019 09/11/2019 09/10/2019  Glucose 70 - 99 mg/dL 96 135(H) 109(H)  BUN 8 - 23 mg/dL 12 10 12   Creatinine 0.44 - 1.00 mg/dL 0.58 0.49 0.52  Sodium 135 - 145 mmol/L 143 140 139  Potassium 3.5 - 5.1 mmol/L 3.4(L) 3.1(L) 3.7  Chloride 98 - 111 mmol/L 112(H) 109 109  CO2 22 - 32 mmol/L 23 22 20(L)  Calcium 8.9 - 10.3 mg/dL 8.3(L) 8.5(L) 8.5(L)  Total Protein 6.5 - 8.1 g/dL - 5.4(L) 5.4(L)  Total Bilirubin 0.3 - 1.2 mg/dL - 0.6 0.6  Alkaline Phos 38 - 126 U/L - 127(H) 124  AST 15 - 41 U/L - 36 43(H)  ALT 0 - 44 U/L - 30 35      Pertinent Microbiology Results for orders placed or performed during the hospital encounter of 09/06/19  Blood Culture (routine x 2)     Status: Abnormal   Collection Time: 09/06/19  4:17 PM   Specimen: BLOOD LEFT FOREARM  Result Value Ref Range Status   Specimen Description BLOOD LEFT FOREARM  Final   Special Requests   Final    BOTTLES DRAWN AEROBIC AND ANAEROBIC Blood Culture  adequate volume   Culture  Setup Time   Final    GRAM NEGATIVE RODS IN BOTH AEROBIC AND ANAEROBIC BOTTLES Organism ID to follow CRITICAL RESULT CALLED TO, READ BACK BY AND VERIFIED WITH: Molly Maduro PharmD 9:10 09/07/19 ([email or web address]) Performed at [facility], [address], [place], [place] [address]    Culture ENTEROBACTER AEROGENES (A)  Final   Report Status 09/09/2019 FINAL  Final   Organism ID, Bacteria ENTEROBACTER AEROGENES  Final      Susceptibility   Enterobacter aerogenes - MIC*    CEFAZOLIN >=64 RESISTANT Resistant     CEFEPIME <=0.12 SENSITIVE Sensitive     CEFTAZIDIME <=1 SENSITIVE Sensitive     CEFTRIAXONE <=0.25 SENSITIVE Sensitive     CIPROFLOXACIN <=0.25 SENSITIVE Sensitive     GENTAMICIN <=1 SENSITIVE Sensitive  IMIPENEM 2 SENSITIVE Sensitive     TRIMETH/SULFA <=20 SENSITIVE Sensitive     PIP/TAZO <=4 SENSITIVE Sensitive     * ENTEROBACTER AEROGENES  Blood Culture ID Panel (Reflexed)     Status: Abnormal   Collection Time: 09/06/19  4:17 PM  Result Value Ref Range Status   Enterococcus faecalis NOT DETECTED NOT DETECTED Final   Enterococcus Faecium NOT DETECTED NOT DETECTED Final   Listeria monocytogenes NOT DETECTED NOT DETECTED Final   Staphylococcus species NOT DETECTED NOT DETECTED Final   Staphylococcus aureus (BCID) NOT DETECTED NOT DETECTED Final   Staphylococcus epidermidis NOT DETECTED NOT DETECTED Final   Staphylococcus lugdunensis NOT DETECTED NOT DETECTED Final   Streptococcus species NOT DETECTED NOT DETECTED Final   Streptococcus agalactiae NOT DETECTED NOT DETECTED Final   Streptococcus pneumoniae NOT DETECTED NOT DETECTED Final   Streptococcus pyogenes NOT DETECTED NOT DETECTED Final   A.calcoaceticus-baumannii NOT DETECTED NOT DETECTED Final   Bacteroides fragilis NOT DETECTED NOT DETECTED Final   Enterobacterales DETECTED (A) NOT DETECTED Final    Comment: Enterobacterales represent a large order of gram negative bacteria, not a single  organism. CRITICAL RESULT CALLED TO, READ BACK BY AND VERIFIED WITH: Molly Maduro PharmD 9:10 09/07/19 ([email or web address])    Enterobacter cloacae complex NOT DETECTED NOT DETECTED Final   Escherichia coli NOT DETECTED NOT DETECTED Final   Klebsiella aerogenes DETECTED (A) NOT DETECTED Final    Comment: CRITICAL RESULT CALLED TO, READ BACK BY AND VERIFIED WITH: Molly Maduro PharmD 9:10 09/07/19 ([email or web address])    Klebsiella oxytoca NOT DETECTED NOT DETECTED Final   Klebsiella pneumoniae NOT DETECTED NOT DETECTED Final   Proteus species NOT DETECTED NOT DETECTED Final   Salmonella species NOT DETECTED NOT DETECTED Final   Serratia marcescens NOT DETECTED NOT DETECTED Final   Haemophilus influenzae NOT DETECTED NOT DETECTED Final   Neisseria meningitidis NOT DETECTED NOT DETECTED Final   Pseudomonas aeruginosa NOT DETECTED NOT DETECTED Final   Stenotrophomonas maltophilia NOT DETECTED NOT DETECTED Final   Candida albicans NOT DETECTED NOT DETECTED Final   Candida auris NOT DETECTED NOT DETECTED Final   Candida glabrata NOT DETECTED NOT DETECTED Final   Candida krusei NOT DETECTED NOT DETECTED Final   Candida parapsilosis NOT DETECTED NOT DETECTED Final   Candida tropicalis NOT DETECTED NOT DETECTED Final   Cryptococcus neoformans/gattii NOT DETECTED NOT DETECTED Final   CTX-M ESBL NOT DETECTED NOT DETECTED Final   Carbapenem resistance IMP NOT DETECTED NOT DETECTED Final   Carbapenem resistance KPC NOT DETECTED NOT DETECTED Final   Carbapenem resistance NDM NOT DETECTED NOT DETECTED Final   Carbapenem resist OXA 48 LIKE NOT DETECTED NOT DETECTED Final   Carbapenem resistance VIM NOT DETECTED NOT DETECTED Final    Comment: Performed at [facility], [address] [place], [place] [address]  Urine culture     Status: None   Collection Time: 09/06/19  4:18 PM   Specimen: In/Out Cath Urine  Result Value Ref Range Status   Specimen Description IN/OUT CATH URINE  Final   Special Requests NONE  Final    Culture   Final    NO GROWTH Performed at [facility], [place] [address] [place], [place] [address]    Report Status 09/07/2019 FINAL  Final  SARS Coronavirus 2 by RT PCR (hospital order, performed in [facility] hospital lab) Nasopharyngeal Urine, Catheterized     Status: None   Collection Time: 09/06/19  4:18 PM   Specimen: Urine, Catheterized;  Nasopharyngeal  Result Value Ref Range Status   SARS Coronavirus 2 NEGATIVE NEGATIVE Final    Comment: (NOTE) SARS-CoV-2 target nucleic acids are NOT DETECTED.  The SARS-CoV-2 RNA is generally detectable in upper and lower respiratory specimens during the acute phase of infection. The lowest concentration of SARS-CoV-2 viral copies this assay can detect is 250 copies / mL. A negative result does not preclude SARS-CoV-2 infection and should not be used as the sole basis for treatment or other patient management decisions.  A negative result may occur with improper specimen collection / handling, submission of specimen other than nasopharyngeal swab, presence of viral mutation(s) within the areas targeted by this assay, and inadequate number of viral copies (<250 copies / mL). A negative result must be combined with clinical observations, patient history, and epidemiological information.  Fact Sheet for Patients:   StrictlyIdeas.no  Fact Sheet for Healthcare Providers: BankingDealers.co.za  This test is not yet approved or  cleared by the [place] FDA and has been authorized for detection and/or diagnosis of SARS-CoV-2 by FDA under an Emergency Use Authorization (EUA).  This EUA will remain in effect (meaning this test can be used) for the duration of the COVID-19 declaration under Section 564(b)(1) of the Act, 21 U.S.C. section 360bbb-3(b)(1), unless the authorization is terminated or revoked sooner.  Performed at [facility], [place] [address] [place], [place] [address]    Blood Culture (routine x 2)     Status: Abnormal   Collection Time: 09/06/19  4:21 PM   Specimen: BLOOD  Result Value Ref Range Status   Specimen Description BLOOD LEFT UPPER ARM  Final   Special Requests   Final    BOTTLES DRAWN AEROBIC AND ANAEROBIC Blood Culture adequate volume   Culture  Setup Time   Final    GRAM NEGATIVE RODS IN BOTH AEROBIC AND ANAEROBIC BOTTLES CRITICAL VALUE NOTED.  VALUE IS CONSISTENT WITH PREVIOUSLY REPORTED AND CALLED VALUE.    Culture (A)  Final    ENTEROBACTER AEROGENES SUSCEPTIBILITIES PERFORMED ON PREVIOUS CULTURE WITHIN THE LAST 5 DAYS. Performed at [facility], [place] [address], [place], [place] [address]    Report Status 09/09/2019 FINAL  Final  Culture, blood (routine x 2)     Status: None   Collection Time: 09/09/19  7:40 AM   Specimen: BLOOD  Result Value Ref Range Status   Specimen Description BLOOD BLOOD LEFT HAND  Final   Special Requests   Final    BOTTLES DRAWN AEROBIC AND ANAEROBIC Blood Culture adequate volume   Culture   Final    NO GROWTH 5 DAYS Performed at [facility], [place] [address] [place], [place] [address]    Report Status 09/14/2019 FINAL  Final  Culture, blood (routine x 2)     Status: None   Collection Time: 09/09/19  7:40 AM   Specimen: BLOOD  Result Value Ref Range Status   Specimen Description BLOOD LEFT ANTECUBITAL  Final   Special Requests   Final    BOTTLES DRAWN AEROBIC AND ANAEROBIC Blood Culture adequate volume   Culture   Final    NO GROWTH 5 DAYS Performed at [facility], [place] [address] [place], [place] [address]    Report Status 09/14/2019 FINAL  Final  SARS Coronavirus 2 by RT PCR (hospital order, performed in [facility] hospital lab) Nasopharyngeal Nasopharyngeal Swab     Status: None   Collection Time: 09/12/19  1:15 PM  Specimen: Nasopharyngeal Swab  Result Value Ref Range Status   SARS Coronavirus 2 NEGATIVE NEGATIVE Final    Comment: (NOTE) SARS-CoV-2 target nucleic  acids are NOT DETECTED.  The SARS-CoV-2 RNA is generally detectable in upper and lower respiratory specimens during the acute phase of infection. The lowest concentration of SARS-CoV-2 viral copies this assay can detect is 250 copies / mL. A negative result does not preclude SARS-CoV-2 infection and should not be used as the sole basis for treatment or other patient management decisions.  A negative result may occur with improper specimen collection / handling, submission of specimen other than nasopharyngeal swab, presence of viral mutation(s) within the areas targeted by this assay, and inadequate number of viral copies (<250 copies / mL). A negative result must be combined with clinical observations, patient history, and epidemiological information.  Fact Sheet for Patients:   StrictlyIdeas.no  Fact Sheet for Healthcare Providers: BankingDealers.co.za  This test is not yet approved or  cleared by the [place] FDA and has been authorized for detection and/or diagnosis of SARS-CoV-2 by FDA under an Emergency Use Authorization (EUA).  This EUA will remain in effect (meaning this test can be used) for the duration of the COVID-19 declaration under Section 564(b)(1) of the Act, 21 U.S.C. section 360bbb-3(b)(1), unless the authorization is terminated or revoked sooner.  Performed at [facility], [place] [address], [place], [place] [address]       All pertinent labs/Imagings/notes reviewed. All pertinent plain films and CT images have been personally visualized and interpreted; radiology reports have been reviewed. Decision making incorporated into the Impression / Recommendations.

## 2019-10-11 ENCOUNTER — Telehealth: Payer: Self-pay

## 2019-10-11 NOTE — Telephone Encounter (Signed)
Phone call placed to patient's daughter to check in and offer to schedule a follow up visit with NP.Daughter directed this RN to call patient's brother, Nicole Kindred. Patient is staying with brother as she needs 24/7 supervision.

## 2019-10-11 NOTE — Telephone Encounter (Signed)
Phone call placed to brother, Samantha Aguirre, to check in on patient and offer to schedule a visit. Samantha Aguirre shared that patient was seen by PCP and by Orthopedist.Tony shared that patient has had some decline physically and a significant decline cognitively. Patient has difficulty finding words. Patient is verbal but is unable to recall the appropriate word. Patient noted to also be wandering and needs supervision. Samantha Aguirre shared that patient was in the hospital with Sepsis and then went to a facility for rehab. Per Samantha Aguirre, prior to hospitalization, patient was living independently and was able to communicate needs. Patient is currently receiving home health with [facility]. Samantha Aguirre expressed concerns that patient may need follow up lab work to ensure that patient's infection is clear. Offered to schedule visit with Palliative NP. Samantha Aguirre to speak with his wife and she will return call to schedule.

## 2019-10-28 ENCOUNTER — Telehealth: Payer: Self-pay | Admitting: Internal Medicine

## 2019-10-28 NOTE — Telephone Encounter (Signed)
Phone call placed to patient to offer to schedule a visit with [facility]. Phone rang, with no answer I left a voicemail for call back. 

## 2019-12-23 ENCOUNTER — Telehealth: Payer: Self-pay

## 2019-12-23 NOTE — Telephone Encounter (Signed)
Spoke with patient's brother Nicole Kindred and scheduled an in-person Palliative Consult for 01/20/2020 @ 10AM   COVID screening was negative. Has 2 dogs and a cat. Will put away before NP arrives.  Patient lives with brother and sister in law at this time.   Consent obtained; updated Outlook/Netsmart/Team List and Epic.

## 2020-01-20 ENCOUNTER — Other Ambulatory Visit: Payer: Medicare Other | Admitting: Internal Medicine

## 2020-01-20 ENCOUNTER — Other Ambulatory Visit: Payer: Self-pay

## 2021-02-01 ENCOUNTER — Telehealth: Payer: Self-pay

## 2021-02-01 NOTE — Telephone Encounter (Signed)
Volunteer called patient/family on behalf of [facility] and did not get a answer from patient/family. ° °

## 2022-01-19 ENCOUNTER — Other Ambulatory Visit: Payer: Self-pay

## 2022-01-19 ENCOUNTER — Emergency Department
Admission: EM | Admit: 2022-01-19 | Discharge: 2022-01-19 | Disposition: A | Discharge status: Home / Self Care | Payer: Medicare Other | Attending: Emergency Medicine | Admitting: Emergency Medicine

## 2022-01-19 ENCOUNTER — Emergency Department: Payer: Medicare Other

## 2022-01-19 DIAGNOSIS — X509XXA Other and unspecified overexertion or strenuous movements or postures, initial encounter: Secondary | ICD-10-CM | POA: Diagnosis not present

## 2022-01-19 DIAGNOSIS — Z85828 Personal history of other malignant neoplasm of skin: Secondary | ICD-10-CM | POA: Insufficient documentation

## 2022-01-19 DIAGNOSIS — E119 Type 2 diabetes mellitus without complications: Secondary | ICD-10-CM | POA: Diagnosis not present

## 2022-01-19 DIAGNOSIS — Y93F9 Activity, other caregiving: Secondary | ICD-10-CM | POA: Insufficient documentation

## 2022-01-19 DIAGNOSIS — M79601 Pain in right arm: Secondary | ICD-10-CM | POA: Diagnosis present

## 2022-01-19 DIAGNOSIS — I509 Heart failure, unspecified: Secondary | ICD-10-CM | POA: Insufficient documentation

## 2022-01-19 DIAGNOSIS — S52121A Displaced fracture of head of right radius, initial encounter for closed fracture: Secondary | ICD-10-CM | POA: Diagnosis not present

## 2022-01-19 DIAGNOSIS — J449 Chronic obstructive pulmonary disease, unspecified: Secondary | ICD-10-CM | POA: Diagnosis not present

## 2022-01-19 DIAGNOSIS — F0150 Vascular dementia without behavioral disturbance: Secondary | ICD-10-CM | POA: Diagnosis not present

## 2022-01-19 DIAGNOSIS — I110 Hypertensive heart disease with heart failure: Secondary | ICD-10-CM | POA: Diagnosis not present

## 2022-01-19 DIAGNOSIS — I2510 Atherosclerotic heart disease of native coronary artery without angina pectoris: Secondary | ICD-10-CM | POA: Diagnosis not present

## 2022-01-19 NOTE — ED Provider Notes (Signed)
[facility] Provider Note    Event Date/Time   First MD Initiated Contact with Patient 01/19/22 1353     (approximate)   History   Fall   HPI  Samantha Aguirre is a 77 y.o. female who presents today for evaluation of right arm pain.  Patient reports that she was trying to help with fellow resident at her nursing home who was going to fall, and she hurt her arm in the process of trying to catch that patient.  She reports that she heard a pop when she did.  She denies head strike or LOC.  No other injury sustained.  Patient Active Problem List   Diagnosis Date Noted   Fall at nursing home    Vascular dementia without behavioral disturbance ([place])    Bacteremia    Sepsis with encephalopathy without septic shock ([place])    AMS (altered mental status) 09/06/2019   Fever 09/06/2019   SIRS (systemic inflammatory response syndrome) ([place]) 09/06/2019   Hypokalemia 09/06/2019   Status post reverse total shoulder replacement, 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   Stroke ([place]) 06/13/2018   Hypotension 03/18/2018   Malnutrition of moderate degree 03/16/2018   Closed fracture of lumbar vertebral body ([place]) 03/15/2018   Speaking difficulty 03/13/2018   TIA (transient ischemic attack) 09/22/2017   PAF (paroxysmal atrial fibrillation) ([place])    Acute CVA (cerebrovascular accident) ([place]) 07/30/2017   GI bleed 07/08/2017   Acute on chronic anemia 07/08/2017   Angiodysplasia of stomach and duodenum with hemorrhage    CVA (cerebral vascular accident) ([place]) 07/06/2017   Stroke (cerebrum) ([place])    Goals of care, counseling/discussion    Palliative care encounter    COPD exacerbation ([place]) 04/03/2017   Flu 03/27/2017   Iron deficiency anemia due to chronic blood loss    Migraine 02/13/2017   Cataracts, bilateral 01/15/2017   Chronic venous insufficiency 10/06/2016   Recurrent major depressive disorder, in partial remission ([place]) 10/06/2016   Left-sided weakness 10/03/2016   Chest pain  02/27/2016   History of stroke 02/19/2016   Thumb pain, left 01/22/2016   Traumatic closed nondisplaced fracture of base of metacarpal bone of left thumb 01/22/2016   Colon cancer screening 11/27/2015   Depression 11/14/2015   Coronary artery disease 11/13/2015   Hypertension 11/13/2015   History of skin cancer 11/13/2015   Osteoporosis 11/13/2015   Chronic low back pain 11/13/2015   Left medial knee pain 11/13/2015   Osteoarthritis of multiple joints 11/13/2015   Other specified hypothyroidism 08/16/2015   Controlled type 2 diabetes mellitus with diabetic neuropathy ([place]) 08/16/2015   COPD (chronic obstructive pulmonary disease) ([place]) 08/16/2015   Tobacco abuse 08/16/2015   HLD (hyperlipidemia) 08/16/2015   History of CHF (congestive heart failure) 08/16/2015   Seizure disorder [facility])           Physical Exam   Triage Vital Signs: ED Triage Vitals  Enc Vitals Group     BP 01/19/22 1244 131/64     Pulse Rate 01/19/22 1244 60     Resp 01/19/22 1244 18     Temp 01/19/22 1244 98.1 F (36.7 C)     Temp Source 01/19/22 1244 Oral     SpO2 01/19/22 1244 96 %     Weight 01/19/22 1245 147 lb (66.7 kg)     Height 01/19/22 1245 '5\' 1"'$  (1.549 m)     Head Circumference --      Peak Flow --      Pain Score 01/19/22 1245  8     Pain Loc --      Pain Edu? --      Excl. in [place]? --     Most recent vital signs: Vitals:   01/19/22 1244  BP: 131/64  Pulse: 60  Resp: 18  Temp: 98.1 F (36.7 C)  SpO2: 96%    Physical Exam Vitals and nursing note reviewed.  Constitutional:      General: Awake and alert. No acute distress.    Appearance: Normal appearance. The patient is normal weight.  HENT:     Head: Normocephalic and atraumatic.     Mouth: Mucous membranes are moist.  Eyes:     General: PERRL. Normal EOMs        Right eye: No discharge.        Left eye: No discharge.     Conjunctiva/sclera: Conjunctivae normal.  Cardiovascular:     Rate and Rhythm: Normal rate and  regular rhythm.     Pulses: Normal pulses.  Pulmonary:     Effort: Pulmonary effort is normal. No respiratory distress.     Breath sounds: Normal breath sounds.  Abdominal:     Abdomen is soft. There is no abdominal tenderness.  Musculoskeletal:        General: No swelling. Normal range of motion.     Cervical back: Normal range of motion and neck supple.  No midline cervical spine tenderness.  Full range of motion of neck.  Negative Spurling test.  Negative Lhermitte sign.  Normal strength and sensation in bilateral upper extremities. Normal grip strength bilaterally.  Normal intrinsic muscle function of the hand bilaterally.  Normal radial pulses bilaterally. Right upper extremity: No clavicular or AC joint tenderness.  Full normal range of motion of the shoulder and wrist.  No snuffbox tenderness.  Compartments are soft and compressible throughout.  She has no skin wounds. Normal grip strength.  Normal intrinsic muscle function of the hand.  Tenderness at the level of the elbow. Skin:    General: Skin is warm and dry.     Capillary Refill: Capillary refill takes less than 2 seconds.     Findings: No rash.  Neurological:     Mental Status: The patient is awake and alert.      ED Results / Procedures / Treatments   Labs (all labs ordered are listed, but only abnormal results are displayed) Labs Reviewed - No data to display   EKG     RADIOLOGY I independently reviewed and interpreted imaging and agree with radiologists findings.     PROCEDURES:  Critical Care performed:   Procedures   MEDICATIONS ORDERED IN ED: Medications - No data to display   IMPRESSION / MDM / [place] / ED COURSE  I reviewed the triage vital signs and the nursing notes.   Differential diagnosis includes, but is not limited to, fracture, contusion, sprain, dislocation.  Patient is awake and alert, hemodynamically stable and afebrile.  She is neurovascularly intact with a normal  radial pulse, normal intrinsic muscle function of the hand, sensation intact light touch.  No tenderness at the level of the shoulder or the wrist.  She has tenderness at the level of the elbow.  Her compartments are soft and compressible throughout, do not suspect compartment syndrome.  Her x-ray was obtained and is positive for a radial head fracture with extension into the articular surface.  Discussed this finding with the patient.  She was placed in a posterior long-arm splint and  instructed to follow-up with orthopedics.  The appropriate follow-up information was provided.  She was offered analgesia but declined.  We discussed return precautions and the importance of close outpatient follow-up.  Patient understands and agrees with plan.  She was discharged in stable condition.   Patient's presentation is most consistent with acute complicated illness / injury requiring diagnostic workup.    FINAL CLINICAL IMPRESSION(S) / ED DIAGNOSES   Final diagnoses:  Closed displaced fracture of head of right radius, initial encounter     Rx / DC Orders   ED Discharge Orders     None        Note:  This document was prepared using Dragon voice recognition software and may include unintentional dictation errors.   Marquette Old, PA-C 01/19/22 1453    Nathaniel Man, MD 01/19/22 [phone]

## 2022-01-19 NOTE — Discharge Instructions (Signed)
Your x-ray shows a fracture near your elbow.  You are placed in a splint.  Keep the splint clean and dry.  Please follow-up with orthopedics, call tomorrow to make an appointment.  Keep your arm elevated.  Please return for any new, worsening, or changing symptoms or other concerns.  It was a pleasure caring for you today.

## 2022-01-19 NOTE — ED Triage Notes (Signed)
Pt in via EMS from assisted living with c/o right wrist to elbow pain. Pt was helping a fellow resident who was going to fall and hurt her arm. Pt reports heard a pop when she did. 57 HR, 147/71, 94% RA

## 2022-01-19 NOTE — ED Triage Notes (Signed)
Pt states she was trying to help a pt who was falling and she ended up also falling and hurting her R arm

## 2022-02-23 ENCOUNTER — Emergency Department: Payer: Medicare Other

## 2022-02-23 ENCOUNTER — Emergency Department
Admission: EM | Admit: 2022-02-23 | Discharge: 2022-02-24 | Disposition: A | Discharge status: Skilled Nursing Facility | Payer: Medicare Other | Attending: Emergency Medicine | Admitting: Emergency Medicine

## 2022-02-23 ENCOUNTER — Other Ambulatory Visit: Payer: Self-pay

## 2022-02-23 DIAGNOSIS — W19XXXA Unspecified fall, initial encounter: Secondary | ICD-10-CM | POA: Insufficient documentation

## 2022-02-23 DIAGNOSIS — Y92129 Unspecified place in nursing home as the place of occurrence of the external cause: Secondary | ICD-10-CM | POA: Insufficient documentation

## 2022-02-23 DIAGNOSIS — S99921A Unspecified injury of right foot, initial encounter: Secondary | ICD-10-CM | POA: Diagnosis not present

## 2022-02-23 DIAGNOSIS — S8991XA Unspecified injury of right lower leg, initial encounter: Secondary | ICD-10-CM | POA: Diagnosis present

## 2022-02-23 DIAGNOSIS — M25561 Pain in right knee: Secondary | ICD-10-CM

## 2022-02-23 DIAGNOSIS — S99911A Unspecified injury of right ankle, initial encounter: Secondary | ICD-10-CM | POA: Diagnosis not present

## 2022-02-23 DIAGNOSIS — Z7982 Long term (current) use of aspirin: Secondary | ICD-10-CM | POA: Insufficient documentation

## 2022-02-23 DIAGNOSIS — M79671 Pain in right foot: Secondary | ICD-10-CM

## 2022-02-23 NOTE — ED Notes (Signed)
[facility] called for transport to [facility]

## 2022-02-23 NOTE — Discharge Instructions (Signed)
Please take Tylenol every 6 hours as needed for pain.  Rest ice and elevate the knee and foot 20 minutes every hour.  Follow-up with primary care provider or orthopedics if no improvement 1 week.

## 2022-02-23 NOTE — ED Notes (Signed)
X-ray @ Bedside

## 2022-02-23 NOTE — ED Provider Notes (Signed)
[facility] Provider Note   CSN: [id number] Arrival date & time: 02/23/22  2020     History  Chief Complaint  Patient presents with   Samantha Aguirre is a 77 y.o. female.  Presents from assisted living facility for unwitnessed fall.  Patient states she slipped getting off the toilet and injured her right knee and right ankle and also her right great toe.  She typically is able to walk but is having hard time walking now mostly due to pain along the right first MTP joint.  She denies hitting her head or losing consciousness.  No headache or neck pain.  No chest pain shortness of breath or abdominal pain.  She is having some swelling throughout the right knee, right ankle and right foot  HPI     Home Medications Prior to Admission medications   Medication Sig Start Date End Date Taking? Authorizing Provider  albuterol (PROVENTIL HFA;VENTOLIN HFA) 108 (90 Base) MCG/ACT inhaler Inhale 2 puffs into the lungs every 6 (six) hours as needed for wheezing or shortness of breath. 10/08/16   Karamalegos, Devonne Doughty, DO  aspirin (ASPIRIN CHILDRENS) 81 MG chewable tablet Chew 1 tablet (81 mg total) by mouth daily. 03/17/18   Bettey Costa, MD  atorvastatin (LIPITOR) 80 MG tablet Take 1 tablet (80 mg total) by mouth daily at 6 PM. 09/24/16   Karamalegos, Devonne Doughty, DO  clopidogrel (PLAVIX) 75 MG tablet Take 75 mg by mouth daily. 10/07/18   [provider]  DULoxetine (CYMBALTA) 60 MG capsule Take 1 capsule (60 mg total) by mouth at bedtime. Patient taking differently: Take 60 mg by mouth daily.  09/24/16   Karamalegos, Devonne Doughty, DO  ferrous sulfate 325 (65 FE) MG EC tablet Take 1 tablet (325 mg total) by mouth 2 (two) times daily with a meal. Patient taking differently: Take 325 mg by mouth daily.  02/21/17   Hillary Bow, MD  fluticasone (FLONASE) 50 MCG/ACT nasal spray Place 1 spray into both nostrils 2 (two) times daily as needed for  allergies.  05/24/19   [provider]  folic acid (FOLVITE) 1 MG tablet Take 1 tablet (1 mg total) by mouth daily. 09/13/19   Hosie Poisson, MD  furosemide (LASIX) 20 MG tablet Take 20 mg by mouth daily.  10/07/18   [provider]  gabapentin (NEURONTIN) 800 MG tablet Take 1 tablet (800 mg total) by mouth 2 (two) times daily. 09/24/16   Karamalegos, Devonne Doughty, DO  glipiZIDE-metformin (METAGLIP) 5-500 MG tablet Take 1 tablet by mouth 2 (two) times daily before a meal. 04/02/17   Karamalegos, Devonne Doughty, DO  levETIRAcetam (KEPPRA) 500 MG tablet Take 1 tablet (500 mg total) by mouth 2 (two) times daily. 09/19/15   Epifanio Lesches, MD  levothyroxine (SYNTHROID) 125 MCG tablet Take 125 mcg by mouth daily before breakfast. 06/16/19   [provider]  Multiple Vitamin-Folic Acid TABS Take 1 tablet by mouth daily.    [provider]  nitroGLYCERIN (NITROSTAT) 0.4 MG SL tablet Place 1 tablet (0.4 mg total) under the tongue every 5 (five) minutes as needed for chest pain. 02/28/16   Bettey Costa, MD  omeprazole (PRILOSEC) 20 MG capsule Take 20 mg by mouth daily.  09/02/17   [provider]  topiramate (TOPAMAX) 25 MG tablet Take 25 mg by mouth 2 (two) times daily. 10/07/18   [provider]  vitamin B-12 (CYANOCOBALAMIN) 1000 MCG tablet Take 1,000 mcg  by mouth daily.    [provider]      Allergies    Ivp dye [iodinated contrast media], Methotrexate, Morphine and related, Mushroom extract complex, Atenolol, Percocet [oxycodone-acetaminophen], Percodan [oxycodone-aspirin], Tramadol, Verapamil, and Oxycodone hcl    Review of Systems   Review of Systems  Physical Exam Updated Vital Signs BP 103/74 (BP Location: Right Arm)   Pulse 65   Temp 98.2 F (36.8 C) (Oral)   Resp 16   Ht 5' 1"$  (1.549 m)   Wt 67 kg   SpO2 96%   BMI 27.91 kg/m  Physical Exam Constitutional:      Appearance: She is well-developed.  HENT:     Head: Normocephalic  and atraumatic.  Eyes:     Conjunctiva/sclera: Conjunctivae normal.  Cardiovascular:     Rate and Rhythm: Normal rate.  Pulmonary:     Effort: Pulmonary effort is normal. No respiratory distress.  Abdominal:     General: Abdomen is flat. Bowel sounds are normal. There is no distension.     Palpations: There is no mass.     Tenderness: There is no abdominal tenderness. There is no right CVA tenderness, left CVA tenderness, guarding or rebound.     Hernia: No hernia is present.  Musculoskeletal:        General: Normal range of motion.     Cervical back: Normal range of motion.     Comments: Right lower extremity shows no pain with logrolling.  Normal hip internal ex rotation with no discomfort.  She is able to actively straight leg raise at the knee.  Mild swelling throughout the knee with minimal superficial abrasion to the right anterior knee.  Patella is nontender.  Patellar tendon nontender.  Knee stable to valgus and varus stress testing but she does have some pain.  She has no skin breakdown noted throughout the lower leg foot and ankle.  She is tender to palpation along the first MTP joint of the right foot.  2+ dorsalis pedis pulses present.  Ankle plantarflexion dorsiflexion is intact.  Compartments are soft throughout the right lower extremity with negative Homans' sign  Skin:    General: Skin is warm.     Findings: No rash.  Neurological:     Mental Status: She is alert and oriented to person, place, and time.  Psychiatric:        Behavior: Behavior normal.        Thought Content: Thought content normal.     ED Results / Procedures / Treatments   Labs (all labs ordered are listed, but only abnormal results are displayed) Labs Reviewed - No data to display  EKG None  Radiology DG Knee 2 Views Right  Result Date: 02/23/2022 CLINICAL DATA:  fall EXAM: RIGHT KNEE - 1-2 VIEW COMPARISON:  None Available. FINDINGS: No evidence of fracture or dislocation. At least small joint  effusion. Tricompartmental mild to moderate degenerative changes of the knee. Anterior subcutaneus soft tissue edema. Vascular calcifications. IMPRESSION: 1. No acute displaced fracture or dislocation. 2. At least small joint effusion. 3. Tricompartmental degenerative changes of the knee. Electronically Signed   By: Iven Finn M.D.   On: 02/23/2022 21:01   DG Ankle Complete Right  Result Date: 02/23/2022 CLINICAL DATA:  Insert straight EXAM: RIGHT ANKLE - COMPLETE 3+ VIEW COMPARISON:  None Available. FINDINGS: There is no evidence of fracture, dislocation, or joint effusion. Moderate degenerative changes the ankle as well as midfoot. Small plantar calcaneal spur. Mild subcutaneus soft  tissue edema. IMPRESSION: No acute displaced fracture or dislocation. Electronically Signed   By: Iven Finn M.D.   On: 02/23/2022 21:00    Procedures Procedures    Medications Ordered in ED Medications - No data to display  ED Course/ Medical Decision Making/ A&P                             Medical Decision Making Amount and/or Complexity of Data Reviewed Radiology: ordered.  77 year old female with fall earlier today.  No head injury LOC nausea vomiting.  No headache neck pain or back pain.  She states she stumbled off the toilet and injured her right knee right foot.  Mostly the first MTP joint.  X-rays of the right knee, ankle and foot ordered and reviewed by me today show no evidence of acute bony abnormality.  She has a bunion deformity to the right first MTP joint.  Recommend rest ice elevation and Tylenol.  They will follow-up with orthopedics or PCP in 1 week if no improvement.  Understand signs symptoms return to ER for.  Final Clinical Impression(s) / ED Diagnoses Final diagnoses:  Acute pain of right knee  Acute foot pain, right    Rx / DC Orders ED Discharge Orders     None         Renata Caprice 02/23/22 2238    Delman Kitten, MD 02/24/22 [phone]

## 2022-02-23 NOTE — ED Triage Notes (Signed)
Pt to ED from [facility] for a unwitnessed fall via EMS . Pt denies hitting her head, denies blood thinners. Pt is having increased weakness and falls in the last few months.   69HR 97% BP 138/59 RR 16  Pt states "I fell getting on the toilet". Pt is CAOx4 and in no acute distress at this time. Pt has obvious swelling to right ankle.

## 2022-04-22 ENCOUNTER — Other Ambulatory Visit: Payer: Self-pay

## 2022-04-22 ENCOUNTER — Emergency Department: Payer: Medicare Other

## 2022-04-22 ENCOUNTER — Emergency Department
Admission: EM | Admit: 2022-04-22 | Discharge: 2022-04-22 | Disposition: A | Discharge status: Home / Self Care | Payer: Medicare Other | Attending: Emergency Medicine | Admitting: Emergency Medicine

## 2022-04-22 DIAGNOSIS — Z8673 Personal history of transient ischemic attack (TIA), and cerebral infarction without residual deficits: Secondary | ICD-10-CM | POA: Insufficient documentation

## 2022-04-22 DIAGNOSIS — S161XXA Strain of muscle, fascia and tendon at neck level, initial encounter: Secondary | ICD-10-CM | POA: Insufficient documentation

## 2022-04-22 DIAGNOSIS — I110 Hypertensive heart disease with heart failure: Secondary | ICD-10-CM | POA: Diagnosis not present

## 2022-04-22 DIAGNOSIS — F0390 Unspecified dementia without behavioral disturbance: Secondary | ICD-10-CM | POA: Insufficient documentation

## 2022-04-22 DIAGNOSIS — Y92410 Unspecified street and highway as the place of occurrence of the external cause: Secondary | ICD-10-CM | POA: Insufficient documentation

## 2022-04-22 DIAGNOSIS — S0083XA Contusion of other part of head, initial encounter: Secondary | ICD-10-CM | POA: Insufficient documentation

## 2022-04-22 DIAGNOSIS — H1131 Conjunctival hemorrhage, right eye: Secondary | ICD-10-CM | POA: Insufficient documentation

## 2022-04-22 DIAGNOSIS — I509 Heart failure, unspecified: Secondary | ICD-10-CM | POA: Diagnosis not present

## 2022-04-22 DIAGNOSIS — J449 Chronic obstructive pulmonary disease, unspecified: Secondary | ICD-10-CM | POA: Insufficient documentation

## 2022-04-22 DIAGNOSIS — E119 Type 2 diabetes mellitus without complications: Secondary | ICD-10-CM | POA: Diagnosis not present

## 2022-04-22 DIAGNOSIS — W19XXXA Unspecified fall, initial encounter: Secondary | ICD-10-CM

## 2022-04-22 DIAGNOSIS — S40011A Contusion of right shoulder, initial encounter: Secondary | ICD-10-CM | POA: Diagnosis not present

## 2022-04-22 DIAGNOSIS — M542 Cervicalgia: Secondary | ICD-10-CM | POA: Diagnosis present

## 2022-04-22 DIAGNOSIS — W1830XA Fall on same level, unspecified, initial encounter: Secondary | ICD-10-CM | POA: Insufficient documentation

## 2022-04-22 NOTE — ED Triage Notes (Addendum)
Pt arrives via ems from [facility], pt had an unwitnessed fall in the hallway and hit the right side of her face, pt has bruising and swelling to her right eye with redness to her sclera, pt was placed in a c-collar by ems, ems denies pt being on blood thinners, uncertain of loc, cbg of 123, 128/65, 81p, 94% RA for ems Pt has a hx of dementia, pt also reports neck pain

## 2022-04-22 NOTE — ED Notes (Signed)
Daughter Midna given report. [facility] notified by voicemail that pt was ready to be picked up

## 2022-04-22 NOTE — Discharge Instructions (Addendum)
Call make an appointment with your primary care provider if any continued problems or concerns.  Continue with your regular medication as directed by your doctor.  You can expect to be sore in a lot of places due to your fall today and because you do have arthritis.  CT scan of your head neck and face were negative for injury.  Your subconjunctival hemorrhage of your right eye will gradually fade.

## 2022-04-22 NOTE — ED Provider Notes (Signed)
[facility] Provider Note    Event Date/Time   First MD Initiated Contact with Patient 04/22/22 1040     (approximate)   History   Facial Pain and Fall   HPI  Samantha Aguirre is a 77 y.o. female   presents to the ED via EMS from [facility] where patient had a unwitnessed fall in the hallway and hit the right side of her face.  Patient states this was basically a mechanical fall as she was walking with her walker and bent over the wrong way causing her to fall.  She denies any other injuries other than her neck and the right side of her face hurting.  Patient has history of CVA, atrial fibs, CHF, COPD, diabetes, hypertension, thyroid disease, seizures.      Physical Exam   Triage Vital Signs: ED Triage Vitals  Enc Vitals Group     BP 04/22/22 0932 120/67     Pulse Rate 04/22/22 0932 84     Resp 04/22/22 0932 16     Temp 04/22/22 0932 98.4 F (36.9 C)     Temp Source 04/22/22 0932 Oral     SpO2 04/22/22 0932 97 %     Weight 04/22/22 0929 160 lb (72.6 kg)     Height 04/22/22 0929 5\' 2"  (1.575 m)     Head Circumference --      Peak Flow --      Pain Score 04/22/22 0928 10     Pain Loc --      Pain Edu? --      Excl. in [place]? --     Most recent vital signs: Vitals:   04/22/22 1238 04/22/22 1350  BP: 122/68 124/70  Pulse: 84 84  Resp: 16 16  Temp: 98.4 F (36.9 C) 98.2 F (36.8 C)  SpO2: 98% 98%     General: Awake, no distress.  Alert, talkative, answers questions appropriately. CV:  Good peripheral perfusion.  Resp:  Normal effort.  Lungs are clear bilaterally. Abd:  No distention.  Soft, nontender, bowel sounds present x 4 quadrants. Other:  White conjunctive a lateral aspect with a small conjunctival hemorrhage.  Bilateral PERRLA, EOMI's.  Minimal tenderness on palpation of the right facial area and without abrasions or discoloration.  Diffuse tenderness on palpation of the cervical spine posteriorly.  Patient is able  move upper and lower extremities without difficulty.  On examination of the lower extremities no abrasions or discoloration.  No knee effusion present bilaterally.  No rotation or shortening and patient is able to bend her lower extremities on her own.  No tenderness or pain noted with compression of the pelvis.   ED Results / Procedures / Treatments   Labs (all labs ordered are listed, but only abnormal results are displayed) Labs Reviewed - No data to display    RADIOLOGY  CT head per radiologist is negative for intracranial changes or fracture. CT cervical spine negative per radiologist for acute bony abnormality. CT maxillofacial without contrast per radiologist is negative for fracture.   PROCEDURES:  Critical Care performed:   Procedures   MEDICATIONS ORDERED IN ED: Medications - No data to display   IMPRESSION / MDM / ASSESSMENT AND PLAN / ED COURSE  I reviewed the triage vital signs and the nursing notes.   Differential diagnosis includes, but is not limited to, head injury, skull fracture, contusion, concussion, cervical spine fracture, contusion, sprain, subconjunctival hemorrhage secondary to fall.  77 year old female presents  to the ED with a mechanical fall from a assisted living facility.  Patient states that she was walking with her walker and bent over causing her to lose her balance.  This was unwitnessed per EMS.  Patient remained talkative and appropriate during her ED visit.  CT scan of the head, cervical spine and maxillofacial were all negative per radiologist.  I reassured patient that the subconjunctival hemorrhage that she has in her right eye will slowly resolve looking much like a bruise.  Patient is aware that she will be sore and stiff from her fall.  Facility was able to send someone to pick her up and she was transported back to assisted living.      Patient's presentation is most consistent with acute presentation with potential threat to life  or bodily function.  FINAL CLINICAL IMPRESSION(S) / ED DIAGNOSES   Final diagnoses:  Contusion of face, initial encounter  Subconjunctival hemorrhage of right eye  Contusion of right shoulder, initial encounter  Cervical strain, acute, initial encounter  Fall, initial encounter     Rx / DC Orders   ED Discharge Orders     None        Note:  This document was prepared using Dragon voice recognition software and may include unintentional dictation errors.   Tommi Rumps, PA-C 04/22/22 1423    Jene Every, MD 04/22/22 [phone]

## 2022-05-09 ENCOUNTER — Emergency Department: Payer: Medicare Other

## 2022-05-09 ENCOUNTER — Other Ambulatory Visit: Payer: Self-pay

## 2022-05-09 ENCOUNTER — Observation Stay
Admission: EM | Admit: 2022-05-09 | Discharge: 2022-05-11 | Disposition: A | Discharge status: Home / Self Care | Payer: Medicare Other | Attending: Internal Medicine | Admitting: Internal Medicine

## 2022-05-09 ENCOUNTER — Observation Stay: Payer: Medicare Other

## 2022-05-09 DIAGNOSIS — I10 Essential (primary) hypertension: Secondary | ICD-10-CM | POA: Diagnosis present

## 2022-05-09 DIAGNOSIS — I110 Hypertensive heart disease with heart failure: Secondary | ICD-10-CM | POA: Insufficient documentation

## 2022-05-09 DIAGNOSIS — E785 Hyperlipidemia, unspecified: Secondary | ICD-10-CM | POA: Diagnosis not present

## 2022-05-09 DIAGNOSIS — Z87891 Personal history of nicotine dependence: Secondary | ICD-10-CM | POA: Insufficient documentation

## 2022-05-09 DIAGNOSIS — F0150 Vascular dementia without behavioral disturbance: Secondary | ICD-10-CM | POA: Diagnosis present

## 2022-05-09 DIAGNOSIS — Z7982 Long term (current) use of aspirin: Secondary | ICD-10-CM | POA: Diagnosis not present

## 2022-05-09 DIAGNOSIS — R531 Weakness: Secondary | ICD-10-CM | POA: Diagnosis present

## 2022-05-09 DIAGNOSIS — M545 Low back pain, unspecified: Secondary | ICD-10-CM

## 2022-05-09 DIAGNOSIS — Z720 Tobacco use: Secondary | ICD-10-CM | POA: Diagnosis present

## 2022-05-09 DIAGNOSIS — E1140 Type 2 diabetes mellitus with diabetic neuropathy, unspecified: Secondary | ICD-10-CM | POA: Diagnosis not present

## 2022-05-09 DIAGNOSIS — Z7984 Long term (current) use of oral hypoglycemic drugs: Secondary | ICD-10-CM | POA: Insufficient documentation

## 2022-05-09 DIAGNOSIS — I2510 Atherosclerotic heart disease of native coronary artery without angina pectoris: Secondary | ICD-10-CM | POA: Diagnosis not present

## 2022-05-09 DIAGNOSIS — F01518 Vascular dementia, unspecified severity, with other behavioral disturbance: Secondary | ICD-10-CM | POA: Insufficient documentation

## 2022-05-09 DIAGNOSIS — Z96611 Presence of right artificial shoulder joint: Secondary | ICD-10-CM | POA: Diagnosis not present

## 2022-05-09 DIAGNOSIS — Z79899 Other long term (current) drug therapy: Secondary | ICD-10-CM | POA: Diagnosis not present

## 2022-05-09 DIAGNOSIS — R569 Unspecified convulsions: Secondary | ICD-10-CM

## 2022-05-09 DIAGNOSIS — J449 Chronic obstructive pulmonary disease, unspecified: Secondary | ICD-10-CM | POA: Diagnosis not present

## 2022-05-09 DIAGNOSIS — I639 Cerebral infarction, unspecified: Secondary | ICD-10-CM | POA: Diagnosis not present

## 2022-05-09 DIAGNOSIS — E1169 Type 2 diabetes mellitus with other specified complication: Secondary | ICD-10-CM | POA: Diagnosis not present

## 2022-05-09 DIAGNOSIS — I480 Paroxysmal atrial fibrillation: Secondary | ICD-10-CM | POA: Diagnosis not present

## 2022-05-09 DIAGNOSIS — J45909 Unspecified asthma, uncomplicated: Secondary | ICD-10-CM | POA: Diagnosis not present

## 2022-05-09 DIAGNOSIS — Z7902 Long term (current) use of antithrombotics/antiplatelets: Secondary | ICD-10-CM | POA: Diagnosis not present

## 2022-05-09 DIAGNOSIS — Z85828 Personal history of other malignant neoplasm of skin: Secondary | ICD-10-CM | POA: Diagnosis not present

## 2022-05-09 DIAGNOSIS — R0989 Other specified symptoms and signs involving the circulatory and respiratory systems: Secondary | ICD-10-CM

## 2022-05-09 DIAGNOSIS — I5032 Chronic diastolic (congestive) heart failure: Secondary | ICD-10-CM | POA: Diagnosis not present

## 2022-05-09 DIAGNOSIS — D649 Anemia, unspecified: Secondary | ICD-10-CM | POA: Diagnosis present

## 2022-05-09 DIAGNOSIS — Z8673 Personal history of transient ischemic attack (TIA), and cerebral infarction without residual deficits: Secondary | ICD-10-CM | POA: Diagnosis not present

## 2022-05-09 DIAGNOSIS — G459 Transient cerebral ischemic attack, unspecified: Principal | ICD-10-CM | POA: Diagnosis present

## 2022-05-09 DIAGNOSIS — Z716 Tobacco abuse counseling: Secondary | ICD-10-CM

## 2022-05-09 LAB — CBC
HCT: 35.7 % — ABNORMAL LOW (ref 36.0–46.0)
Hemoglobin: 11.6 g/dL — ABNORMAL LOW (ref 12.0–15.0)
MCH: 30.4 pg (ref 26.0–34.0)
MCHC: 32.5 g/dL (ref 30.0–36.0)
MCV: 93.5 fL (ref 80.0–100.0)
Platelets: 287 10*3/uL (ref 150–400)
RBC: 3.82 MIL/uL — ABNORMAL LOW (ref 3.87–5.11)
RDW: 13.8 % (ref 11.5–15.5)
WBC: 8.6 10*3/uL (ref 4.0–10.5)
nRBC: 0 % (ref 0.0–0.2)

## 2022-05-09 LAB — COMPREHENSIVE METABOLIC PANEL
ALT: 11 U/L (ref 0–44)
AST: 18 U/L (ref 15–41)
Albumin: 3.5 g/dL (ref 3.5–5.0)
Alkaline Phosphatase: 101 U/L (ref 38–126)
Anion gap: 8 (ref 5–15)
BUN: 18 mg/dL (ref 8–23)
CO2: 27 mmol/L (ref 22–32)
Calcium: 8.3 mg/dL — ABNORMAL LOW (ref 8.9–10.3)
Chloride: 104 mmol/L (ref 98–111)
Creatinine, Ser: 0.75 mg/dL (ref 0.44–1.00)
GFR, Estimated: >60 mL/min (ref >60)
Glucose, Bld: 68 mg/dL — ABNORMAL LOW (ref 70–99)
Potassium: 3.1 mmol/L — ABNORMAL LOW (ref 3.5–5.1)
Sodium: 139 mmol/L (ref 135–145)
Total Bilirubin: 0.5 mg/dL (ref 0.3–1.2)
Total Protein: 6.4 g/dL — ABNORMAL LOW (ref 6.5–8.1)

## 2022-05-09 LAB — URINALYSIS, ROUTINE W REFLEX MICROSCOPIC
Bilirubin Urine: NEGATIVE
Glucose, UA: NEGATIVE mg/dL
Hgb urine dipstick: NEGATIVE
Ketones, ur: NEGATIVE mg/dL
Leukocytes,Ua: NEGATIVE
Nitrite: NEGATIVE
Protein, ur: NEGATIVE mg/dL
Specific Gravity, Urine: 1.013 (ref 1.005–1.030)
pH: 5 (ref 5.0–8.0)

## 2022-05-09 LAB — DIFFERENTIAL
Abs Immature Granulocytes: 0.04 10*3/uL (ref 0.00–0.07)
Basophils Absolute: 0.1 10*3/uL (ref 0.0–0.1)
Basophils Relative: 1 %
Eosinophils Absolute: 0.2 10*3/uL (ref 0.0–0.5)
Eosinophils Relative: 2 %
Immature Granulocytes: 1 %
Lymphocytes Relative: 28 %
Lymphs Abs: 2.4 10*3/uL (ref 0.7–4.0)
Monocytes Absolute: 0.6 10*3/uL (ref 0.1–1.0)
Monocytes Relative: 7 %
Neutro Abs: 5.4 10*3/uL (ref 1.7–7.7)
Neutrophils Relative %: 61 %

## 2022-05-09 LAB — CBG MONITORING, ED
Glucose-Capillary: 70 mg/dL (ref 70–99)
Glucose-Capillary: 79 mg/dL (ref 70–99)

## 2022-05-09 LAB — APTT: aPTT: 27 seconds (ref 24–36)

## 2022-05-09 LAB — GLUCOSE, CAPILLARY: Glucose-Capillary: 144 mg/dL — ABNORMAL HIGH (ref 70–99)

## 2022-05-09 LAB — ETHANOL: Alcohol, Ethyl (B): <10 mg/dL (ref <10)

## 2022-05-09 LAB — PROTIME-INR
INR: 1 (ref 0.8–1.2)
Prothrombin Time: 12.9 seconds (ref 11.4–15.2)

## 2022-05-09 MED ORDER — DULOXETINE HCL 30 MG PO CPEP
60.0000 mg | ORAL_CAPSULE | Freq: Every day | ORAL | Status: DC
  Administered 2022-05-10 – 2022-05-11 (×2): 60 mg via ORAL
  Filled 2022-05-09 (×2): qty 2

## 2022-05-09 MED ORDER — CLOPIDOGREL BISULFATE 75 MG PO TABS
75.0000 mg | ORAL_TABLET | Freq: Every day | ORAL | Status: DC
  Administered 2022-05-09 – 2022-05-11 (×3): 75 mg via ORAL
  Filled 2022-05-09 (×3): qty 1

## 2022-05-09 MED ORDER — FERROUS SULFATE 325 (65 FE) MG PO TABS
325.0000 mg | ORAL_TABLET | Freq: Every day | ORAL | Status: DC
  Administered 2022-05-10 – 2022-05-11 (×2): 325 mg via ORAL
  Filled 2022-05-09 (×2): qty 1

## 2022-05-09 MED ORDER — PANTOPRAZOLE SODIUM 40 MG PO TBEC
40.0000 mg | DELAYED_RELEASE_TABLET | Freq: Every day | ORAL | Status: DC
  Administered 2022-05-10 – 2022-05-11 (×2): 40 mg via ORAL
  Filled 2022-05-09 (×2): qty 1

## 2022-05-09 MED ORDER — GADOBUTROL 1 MMOL/ML IV SOLN
7.0000 mL | Freq: Once | INTRAVENOUS | Status: AC | PRN
  Administered 2022-05-09: 7 mL via INTRAVENOUS

## 2022-05-09 MED ORDER — ASPIRIN 81 MG PO CHEW
81.0000 mg | CHEWABLE_TABLET | Freq: Every day | ORAL | Status: DC
  Administered 2022-05-09 – 2022-05-11 (×3): 81 mg via ORAL
  Filled 2022-05-09 (×3): qty 1

## 2022-05-09 MED ORDER — LEVOTHYROXINE SODIUM 100 MCG PO TABS
100.0000 ug | ORAL_TABLET | Freq: Every day | ORAL | Status: DC
  Administered 2022-05-10 – 2022-05-11 (×2): 100 ug via ORAL
  Filled 2022-05-09 (×2): qty 1

## 2022-05-09 MED ORDER — INSULIN ASPART 100 UNIT/ML IJ SOLN
0.0000 [IU] | Freq: Three times a day (TID) | INTRAMUSCULAR | Status: DC
  Administered 2022-05-10 (×2): 1 [IU] via SUBCUTANEOUS
  Administered 2022-05-10: 2 [IU] via SUBCUTANEOUS
  Administered 2022-05-11 (×2): 1 [IU] via SUBCUTANEOUS
  Filled 2022-05-09 (×5): qty 1

## 2022-05-09 MED ORDER — STROKE: EARLY STAGES OF RECOVERY BOOK: Freq: Once | Status: AC

## 2022-05-09 MED ORDER — ATORVASTATIN CALCIUM 20 MG PO TABS: 10.0000 mg | ORAL_TABLET | Freq: Every day | ORAL | Status: DC

## 2022-05-09 MED ORDER — POTASSIUM CHLORIDE CRYS ER 20 MEQ PO TBCR
40.0000 meq | EXTENDED_RELEASE_TABLET | Freq: Once | ORAL | Status: AC
  Administered 2022-05-09: 40 meq via ORAL
  Filled 2022-05-09: qty 2

## 2022-05-09 MED ORDER — SODIUM CHLORIDE 0.9% FLUSH
3.0000 mL | Freq: Once | INTRAVENOUS | Status: AC
  Administered 2022-05-09: 3 mL via INTRAVENOUS

## 2022-05-09 MED ORDER — TOPIRAMATE 25 MG PO TABS: 25.0000 mg | ORAL_TABLET | Freq: Two times a day (BID) | ORAL | Status: DC

## 2022-05-09 MED ORDER — LEVETIRACETAM 500 MG PO TABS
500.0000 mg | ORAL_TABLET | Freq: Two times a day (BID) | ORAL | Status: DC
  Administered 2022-05-09 – 2022-05-11 (×4): 500 mg via ORAL
  Filled 2022-05-09 (×4): qty 1

## 2022-05-09 NOTE — Assessment & Plan Note (Signed)
Allow for permissive hypertension in setting of CVA As needed labetalol for systolic pressure greater than 220 or diastolic pressure greater than 110 Follow

## 2022-05-09 NOTE — Assessment & Plan Note (Signed)
Lipid panel  High dose statin  

## 2022-05-09 NOTE — Assessment & Plan Note (Signed)
1/4 to 1/2 pack/day smoker Discussed cessation Nicotine patch

## 2022-05-09 NOTE — Code Documentation (Signed)
Stroke Response Nurse Documentation Code Documentation  Samantha Aguirre is a 77 y.o. female arriving to [facility] via [facility] on 05/09/22 with past medical hx of seizures, asthma, skin cancer, thyroid disease, tremors, HLD, COPD, CAD, CHF, HTN, DM, a-fib, previous stroke with left sided weakness. On clopidogrel 75 mg daily. Code stroke was activated by EMS.   Patient from [facility] where she was LKW at 1030 and now complaining of left sided facial droop, left sided weakness, and decreased sensation. Per EMS, patient was last seen normal by staff at 1030 before she went outside. Staff found her later with worsening facial droop and EMS was called. Patient endorses left sided facial droop and left sided decreased sensation is not her normal.   Stroke team at the bedside on patient arrival. Labs drawn and patient cleared for CT by Dr. Fanny Bien. Patient to CT with team. NIHSS 5, see documentation for details and code stroke times. Patient with disoriented, left facial droop, left arm weakness, left leg weakness, and left decreased sensation on exam. The following imaging was completed:  CT Head. Patient is not a candidate for IV Thrombolytic due to too mild to treat, per MD. Patient is not a candidate for IR due to exam not consistent with LVO, per MD.   Care Plan: q30 minute VS and NIHSS until patient outside window at 1500, followed by q2h NIHSS and VS. Swallow screen per order.   Bedside handoff with ED RN Sydell Axon.    Wille Glaser  Stroke Response RN

## 2022-05-09 NOTE — ED Triage Notes (Signed)
[facility] with C?O left sided facial droop and left sided weakness.  [id number] and staff discovered symptoms at 1100.  Hx CVA with residual right sided deficits.  AAOx3.  Skin warm and dry. NAD

## 2022-05-09 NOTE — ED Notes (Signed)
Pt given apple juice  

## 2022-05-09 NOTE — Evaluation (Signed)
Physical Therapy Evaluation Patient Details Name: Samantha Aguirre MRN: [id number] DOB: 1945/10/07 Today's Date: 05/09/2022  History of Present Illness  Pt is a 77 y.o. female with PMH that includes: atrial fibrillation, CHF, COPD, DM, and prior anticoagulant use discontinued in the setting of suspected GI bleeding.  Pt presented to the ED with LUE and left facial weakness.  Per head CT impression: No acute intracranial process, redemonstrated remote left parietal and bilateral cerebellar infarcts.   Clinical Impression  Pt was pleasant and motivated to participate during the session and put forth good effort throughout. Pt required no physical assistance with functional tasks and was generally steady with standing activities with no overt LOB.  Pt with residual R sided weakness but no significant weakness noted to the LUE or RLE.  Pt did report present but diminished sensation to the LLE that she reported as acute.  Fine motor coordination deficits noted to the UEs but equal left/right.  Subjectively the pt reported feeling close to her baseline but overall minimally weaker than normal.  Pt will benefit from continued PT services upon discharge to safely address deficits listed in patient problem list for decreased caregiver assistance and eventual return to PLOF.         Recommendations for follow up therapy are one component of a multi-disciplinary discharge planning process, led by the attending physician.  Recommendations may be updated based on patient status, additional functional criteria and insurance authorization.  Follow Up Recommendations       Assistance Recommended at Discharge Intermittent Supervision/Assistance  Patient can return home with the following  A little help with walking and/or transfers;A little help with bathing/dressing/bathroom;Assist for transportation    Equipment Recommendations None recommended by PT  Recommendations for Other Services       Functional  Status Assessment Patient has had a recent decline in their functional status and demonstrates the ability to make significant improvements in function in a reasonable and predictable amount of time.     Precautions / Restrictions Precautions Precautions: Fall Restrictions Weight Bearing Restrictions: No      Mobility  Bed Mobility Overal bed mobility: Modified Independent             General bed mobility comments: Min increased time and effort only    Transfers Overall transfer level: Needs assistance Equipment used: Rolling walker (2 wheels) Transfers: Sit to/from Stand Sit to Stand: Supervision           General transfer comment: Good eccentric and concentric control and stability with no overt LOB upon coming to standing    Ambulation/Gait Ambulation/Gait assistance: Supervision Gait Distance (Feet): 40 Feet Assistive device: Rolling walker (2 wheels) Gait Pattern/deviations: Step-through pattern, Decreased step length - right, Decreased step length - left Gait velocity: decreased     General Gait Details: Slow cadence but steady with no overt LOB and no adverse symptoms  Stairs            Wheelchair Mobility    Modified Rankin (Stroke Patients Only)       Balance Overall balance assessment: Needs assistance   Sitting balance-Leahy Scale: Good     Standing balance support: Bilateral upper extremity supported, During functional activity Standing balance-Leahy Scale: Good                               Pertinent Vitals/Pain Pain Assessment Pain Assessment: 0-10 Pain Score: 7  Pain Location: LUE Pain Descriptors /  Indicators: Sore Pain Intervention(s): Premedicated before session, Monitored during session    Home Living Family/patient expects to be discharged to:: Assisted living                 Home Equipment: Rollator (4 wheels);BSC/3in1;Shower seat - built in Additional Comments: Pt is a resident at [facility]  in [place]    Prior Function Prior Level of Function : Independent/Modified Independent             Mobility Comments: Mod Ind amb facility distances with a rollator, 3 falls in the last 6 months all from LOB ADLs Comments: Ind with ADLs     Hand Dominance   Dominant Hand: Right    Extremity/Trunk Assessment   Upper Extremity Assessment Upper Extremity Assessment: RUE deficits/detail;Defer to OT evaluation RUE Coordination: decreased fine motor LUE Deficits / Details: LUE elbow flex/ext and shoulder flex all grossly 4/5 LUE Coordination: decreased fine motor (decreased but equal left/right)    Lower Extremity Assessment Lower Extremity Assessment: RLE deficits/detail;LLE deficits/detail RLE Deficits / Details: residual RLE weakness from chronic CVA LLE Deficits / Details: LLE knee flex/ext and hip flex strength grossly 4/5 LLE Sensation: decreased light touch       Communication   Communication: No difficulties  Cognition Arousal/Alertness: Awake/alert Behavior During Therapy: WFL for tasks assessed/performed Overall Cognitive Status: Within Functional Limits for tasks assessed                                          General Comments      Exercises Total Joint Exercises Ankle Circles/Pumps: AROM, Strengthening, Both, 5 reps, 10 reps Quad Sets: Strengthening, Both, 5 reps, 10 reps Gluteal Sets: Strengthening, Both, 5 reps, 10 reps Hip ABduction/ADduction: Strengthening, Both, 5 reps Straight Leg Raises: Strengthening, Both, 5 reps Long Arc Quad: Strengthening, Both, 10 reps Knee Flexion: Strengthening, Both, 10 reps Marching in Standing: Strengthening, Both, 5 reps, Standing   Assessment/Plan    PT Assessment Patient needs continued PT services  PT Problem List Decreased strength;Decreased activity tolerance;Decreased balance;Decreased mobility;Decreased knowledge of use of DME       PT Treatment Interventions DME instruction;Gait  training;Functional mobility training;Therapeutic activities;Therapeutic exercise;Balance training;Patient/family education    PT Goals (Current goals can be found in the Care Plan section)  Acute Rehab PT Goals Patient Stated Goal: To get stronger PT Goal Formulation: With patient Time For Goal Achievement: 05/22/22 Potential to Achieve Goals: Good    Frequency Min 2X/week     Co-evaluation               AM-PAC PT "6 Clicks" Mobility  Outcome Measure Help needed turning from your back to your side while in a flat bed without using bedrails?: None Help needed moving from lying on your back to sitting on the side of a flat bed without using bedrails?: None Help needed moving to and from a bed to a chair (including a wheelchair)?: A Little Help needed standing up from a chair using your arms (e.g., wheelchair or bedside chair)?: A Little Help needed to walk in hospital room?: A Little Help needed climbing 3-5 steps with a railing? : A Little 6 Click Score: 20    End of Session Equipment Utilized During Treatment: Gait belt Activity Tolerance: Patient tolerated treatment well Patient left: in bed;with call bell/phone within reach;Other (comment) (both ED bed rails raised as pt was  found) Nurse Communication: Mobility status PT Visit Diagnosis: History of falling (Z91.81);Difficulty in walking, not elsewhere classified (R26.2);Muscle weakness (generalized) (M62.81)    Time: 1610-9604 PT Time Calculation (min) (ACUTE ONLY): 25 min   Charges:   PT Evaluation $PT Eval Moderate Complexity: 1 Mod PT Treatments $Therapeutic Exercise: 8-22 mins       D. Scott Mary Hockey PT, DPT 05/09/22, 4:20 PM

## 2022-05-09 NOTE — ED Provider Notes (Signed)
[facility] Provider Note   Event Date/Time   First MD Initiated Contact with Patient 05/09/22 1306     (approximate)  History   Code Stroke  EM caveat: Somewhat poor historian  Side evaluated the patient in conjunction with Dr. Selina Cooley on ED arrival with EMS  HPI  Samantha Aguirre is a 77 y.o. female   history of atrial fibrillation congestive heart failure COPD diabetes prior anticoagulant use but discontinued in the setting of suspected GI bleeding etc. Patient presents today with EMS they reported approximately 11 patient was found with new weakness in her left arm and left face.    Patient reports she has a history of prior weakness from stroke.  EMS reports the patient was last seen well at approximately 10:30 AM today  EMS reports no anticoagulant use  Physical Exam   Triage Vital Signs: ED Triage Vitals  Enc Vitals Group     BP      Pulse      Resp      Temp      Temp src      SpO2      Weight      Height      Head Circumference      Peak Flow      Pain Score      Pain Loc      Pain Edu?      Excl. in GC?     Most recent vital signs: Vitals:   05/09/22 1330 05/09/22 1400  BP: 113/85 (!) 135/53  Pulse: (!) 50   Resp: 17   Temp: 98.6 F (37 C)   SpO2: 96%      General: Awake, no distress.  Slight weakness in the left face CV:  Good peripheral perfusion.  Resp:  Normal effort.  Abd:  No distention.  Other:  Slight weakness and some ataxic movements of the left upper extremity.  NIH approximately 5 (please see full NIH perofrmed by Dr. Selina Cooley)   ED Results / Procedures / Treatments   Labs (all labs ordered are listed, but only abnormal results are displayed) Labs Reviewed  CBC - Abnormal; Notable for the following components:      Result Value   RBC 3.82 (*)    Hemoglobin 11.6 (*)    HCT 35.7 (*)    All other components within normal limits  COMPREHENSIVE METABOLIC PANEL - Abnormal; Notable for the following  components:   Potassium 3.1 (*)    Glucose, Bld 68 (*)    Calcium 8.3 (*)    Total Protein 6.4 (*)    All other components within normal limits  URINALYSIS, ROUTINE W REFLEX MICROSCOPIC - Abnormal; Notable for the following components:   Color, Urine YELLOW (*)    APPearance CLEAR (*)    All other components within normal limits  PROTIME-INR  APTT  DIFFERENTIAL  ETHANOL  HEMOGLOBIN A1C  CBG MONITORING, ED  CBG MONITORING, ED  I-STAT CREATININE, ED   Labs interpreted as mild anemia.  Normal platelet count.  Comprehensive metabolic panel no significant acute abnormalities, glucose 68, potassium 3.1.  Recheck glucose 79  EKG  Reviewed inter by me at 1330 heart rate 50 QRS 90 QTc 430 Normal sinus rhythm, somewhat low amplitude.  No evidence acute ischemia   RADIOLOGY  CT head interpreted by me as negative for acute intracranial hemorrhage.  CT HEAD CODE STROKE WO CONTRAST  Result Date: 05/09/2022 CLINICAL DATA:  Code stroke. EXAM:  CT HEAD WITHOUT CONTRAST TECHNIQUE: Contiguous axial images were obtained from the base of the skull through the vertex without intravenous contrast. RADIATION DOSE REDUCTION: This exam was performed according to the departmental dose-optimization program which includes automated exposure control, adjustment of the mA and/or kV according to patient size and/or use of iterative reconstruction technique. COMPARISON:  04/22/2022 FINDINGS: Brain: No evidence of acute infarction, hemorrhage, mass, mass effect, or midline shift. No hydrocephalus or extra-axial collection. Redemonstrated remote left parietal and bilateral cerebellar infarcts. Periventricular white matter changes, likely the sequela of chronic small vessel ischemic disease. Vascular: No hyperdense vessel. Skull: Negative for fracture or focal lesion. Sinuses/Orbits: No acute finding. Other: The mastoid air cells are well aerated. ASPECTS [facility] Stroke Program Early CT Score) - Ganglionic level  infarction (caudate, lentiform nuclei, internal capsule, insula, M1-M3 cortex): 7 - Supraganglionic infarction (M4-M6 cortex): 3 Total score (0-10 with 10 being normal): 10 IMPRESSION: 1. No acute intracranial process. ASPECTS is 10. 2. Redemonstrated remote left parietal and bilateral cerebellar infarcts. Imaging results were communicated on 05/09/2022 at 1:21 pm to provider STACK via secure text paging. Electronically Signed   By: Wiliam Ke M.D.   On: 05/09/2022 13:21        PROCEDURES:  Critical Care performed: Yes, see critical care procedure note(s)  CRITICAL CARE Performed by: Sharyn Creamer   Total critical care time: 25 minutes  Critical care time was exclusive of separately billable procedures and treating other patients.  Critical care was necessary to treat or prevent imminent or life-threatening deterioration.  Critical care was time spent personally by me on the following activities: development of treatment plan with patient and/or surrogate as well as nursing, discussions with consultants, evaluation of patient's response to treatment, examination of patient, obtaining history from patient or surrogate, ordering and performing treatments and interventions, ordering and review of laboratory studies, ordering and review of radiographic studies, pulse oximetry and re-evaluation of patient's condition.   Procedures   MEDICATIONS ORDERED IN ED: Medications   stroke: early stages of recovery book (has no administration in time range)  insulin aspart (novoLOG) injection 0-9 Units (has no administration in time range)  sodium chloride flush (NS) 0.9 % injection 3 mL (3 mLs Intravenous Given 05/09/22 1321)     IMPRESSION / MDM / ASSESSMENT AND PLAN / ED COURSE  I reviewed the triage vital signs and the nursing notes.                              Differential diagnosis includes, but is not limited to, stroke, recrudescence, metabolic abnormality, intracranial hemorrhage,  peripheral deficit, etc.  Patient has a mild to borderline hypoglycemia, but is fully awake and alert.  She shows focal deficit in the left arm only.  Doubt related to hypoglycemia, and in this particular setting with her clinical history is highly suspect she may have had a recurrent stroke but also discussed case with Dr. Selina Cooley and given the patient's risk of bleeding, also relatively minor symptoms, decision made by neurology to not proceed with TNK administration.  Patient does not have evidence to suggest a high likelihood of large vessel occlusion.  No visual changes no aphasia, no neglect noted.  Patient's presentation is most consistent with acute complicated illness / injury requiring diagnostic workup.   The patient is on the cardiac monitor to evaluate for evidence of arrhythmia and/or significant heart rate changes.  ----------------------------------------- 1:57 PM on 05/09/2022 -----------------------------------------  Consulted with our hospitalist, patient accepted to the service of Dr. Alvester Morin      FINAL CLINICAL IMPRESSION(S) / ED DIAGNOSES   Final diagnoses:  Cerebrovascular accident (CVA), unspecified mechanism (HCC)     Rx / DC Orders   ED Discharge Orders     None        Note:  This document was prepared using Dragon voice recognition software and may include unintentional dictation errors.   Sharyn Creamer, MD 05/09/22 1434

## 2022-05-09 NOTE — Progress Notes (Signed)
       CROSS COVER NOTE  NAME: Samantha Aguirre MRN: [id number] DOB : [birth year]    HPI/Events of Note   Report:nurse reports patient states  she is a DNR and has documents  On review of chart:MOST and DNR form reviewed in EPIC    Assessment and  Interventions   Assessment: Alert and oriented x 4  Reviewed MOST and DNR document at bedside Plan: Code status changed to DNR       Donnie Mesa NP [facility]

## 2022-05-09 NOTE — Progress Notes (Signed)
1256- EMS pre alert for code stroke. Dr Selina Cooley in ED waiting for pt's arrival. 1300- Pt arrived- Dr Selina Cooley assessing. LKW 1030 1305- Pt taken to CT. Telestroke RN off camera, Actuary to complete session at bedside.

## 2022-05-09 NOTE — Assessment & Plan Note (Signed)
Cont keppra and topamax

## 2022-05-09 NOTE — Consult Note (Signed)
NEUROLOGY CONSULTATION NOTE   Date of service: May 09, 2022 Patient Name: Samantha Aguirre MRN:  [id number] DOB:  Dec 14, 1945 Reason for consult: stroke code Requesting physician: Dr. Sharyn Creamer _ _ _   _ __   _ __ _ _  __ __   _ __   __ _  History of Present Illness   77 yo woman with pmhx a fib off anticoagulation after GIB, COPD, CAD, DM2, hx CVA with residual L sided weakness who presents with acute worsening of chronic weakness of her L face/arm/leg. LKW 1030. On aspirin and plavix prior to admission. NIHSS = 5. CT head showed no acute process, but did show remote L parietal and bilateral cerebellar infarcts. TNK not administered 2/2 hx GIB (stroke team talked to GI about starting eliquis for a fib during previous admissions and GI felt she remained too high risk for bleeding to anticoagulate). CTA not performed as part of stroke code bc exam not c/w LVO.   ROS   Per HPI: all other systems reviewed and are negative  Past History   I have reviewed the following:  Past Medical History:  Diagnosis Date   Acid reflux    Arthritis    Bilateral knees, hands, feet   Asthma    Atrial fibrillation (HCC)    Cancer (HCC)    CHF (congestive heart failure) (HCC)    Diastolic CHF   COPD (chronic obstructive pulmonary disease) (HCC)    Coronary artery disease    Diabetes mellitus without complication (HCC)    Frequent headaches    GI bleed    HLD (hyperlipidemia)    Hypertension    Seizures (HCC)    Skin cancer 2015   Suspected basal cell carcinoma on nose, surgically removed   Stroke (HCC) 04/2017   Thyroid disease    Tremors of nervous system    Past Surgical History:  Procedure Laterality Date   ABDOMINAL HYSTERECTOMY  1976   APPENDECTOMY  1980   BACK SURGERY     cardiac stents     CAROTID ENDARTERECTOMY     CHOLECYSTECTOMY  1980   COLONOSCOPY N/A 02/20/2017   Procedure: COLONOSCOPY;  Surgeon: Toney Reil, MD;  Location: [facility] ENDOSCOPY;  Service: Gastroenterology;   Laterality: N/A;   ESOPHAGOGASTRODUODENOSCOPY N/A 02/20/2017   Procedure: ESOPHAGOGASTRODUODENOSCOPY (EGD);  Surgeon: Toney Reil, MD;  Location: [facility] ENDOSCOPY;  Service: Gastroenterology;  Laterality: N/A;   ESOPHAGOGASTRODUODENOSCOPY (EGD) WITH PROPOFOL N/A 07/08/2017   Procedure: ESOPHAGOGASTRODUODENOSCOPY (EGD) WITH PROPOFOL;  Surgeon: Midge Minium, MD;  Location: [facility] ENDOSCOPY;  Service: Endoscopy;  Laterality: N/A;   GALLBLADDER SURGERY  1980   GIVENS CAPSULE STUDY N/A 07/09/2017   Procedure: GIVENS CAPSULE STUDY;  Surgeon: Wyline Mood, MD;  Location: [facility] ENDOSCOPY;  Service: Gastroenterology;  Laterality: N/A;   KNEE SURGERY     left   KYPHOPLASTY N/A 03/16/2018   Procedure: KYPHOPLASTY, L5;  Surgeon: Kennedy Bucker, MD;  Location: [facility] ORS;  Service: Orthopedics;  Laterality: N/A;   REVERSE SHOULDER ARTHROPLASTY Right 08/25/2019   Procedure: REVERSE SHOULDER ARTHROPLASTY;  Surgeon: Christena Flake, MD;  Location: [facility] ORS;  Service: Orthopedics;  Laterality: Right;   ROTATOR CUFF REPAIR     right   TONSILLECTOMY  1950   Family History  Problem Relation Age of Onset   Breast cancer Mother    Heart disease Mother    Stroke Mother    Cancer Mother    COPD Mother    Diabetes Mother  Heart disease Father    Diabetes Father    Stroke Father    Alcohol abuse Sister    Drug abuse Sister    Stroke Sister    Cancer Sister    Mental illness Sister    Heart disease Brother    Arthritis Brother    Diabetes Brother    Heart disease Maternal Grandfather    Heart disease Paternal Grandfather    Social History   Socioeconomic History   Marital status: Divorced    Spouse name: Not on file   Number of children: 2   Years of education: 12   Highest education level: 12th grade  Occupational History   Occupation: Disabled  Tobacco Use   Smoking status: Former    Packs/day: 1.00    Years: 53.00    Additional pack years: 0.00    Total pack years: 53.00    Types: Cigarettes    Smokeless tobacco: Never  Vaping Use   Vaping Use: Former  Substance and Sexual Activity   Alcohol use: No   Drug use: No   Sexual activity: Not Currently  Other Topics Concern   Not on file  Social History Narrative   None   Social Determinants of Health   Financial Resource Strain: Medium Risk (04/02/2017)   Overall Financial Resource Strain (CARDIA)    Difficulty of Paying Living Expenses: Somewhat hard  Food Insecurity: Food Insecurity Present (04/02/2017)   Hunger Vital Sign    Worried About Running Out of Food in the Last Year: Sometimes true    Ran Out of Food in the Last Year: Sometimes true  Transportation Needs: No Transportation Needs (04/02/2017)   PRAPARE - Administrator, Civil Service (Medical): No    Lack of Transportation (Non-Medical): No  Physical Activity: Insufficiently Active (05/19/2017)   Exercise Vital Sign    Days of Exercise per Week: 2 days    Minutes of Exercise per Session: 20 min  Stress: Stress Concern Present (05/19/2017)   [employer] of Occupational Health - Occupational Stress Questionnaire    Feeling of Stress : Rather much  Social Connections: Moderately Isolated (05/19/2017)   Social Connection and Isolation Panel [NHANES]    Frequency of Communication with Friends and Family: More than three times a week    Frequency of Social Gatherings with Friends and Family: More than three times a week    Attends Religious Services: Never    Database administrator or Organizations: No    Attends Engineer, structural: Never    Marital Status: Divorced   Allergies  Allergen Reactions   Ivp Dye [Iodinated Contrast Media] Itching    Pt injected with IV contrast.  5 min after injection pt complained of itching behind ear and on her abd.   Methotrexate Rash   Morphine And Related Other (See Comments)    "stops my breathing" NEAR-RESPIRATORY ARREST   Mushroom Extract Complex Anaphylaxis    Made patient "deathly sick"    Atenolol Other (See Comments)    "MD took me off of it bc it was doing something wrong" Made patient HYPOTENSIVE   Percocet [Oxycodone-Acetaminophen] Nausea And Vomiting and Other (See Comments)    Stomach pains   Percodan [Oxycodone-Aspirin] Nausea And Vomiting and Other (See Comments)    Stomach pains   Tramadol Nausea Only   Verapamil Other (See Comments)    " MD told me this was screwing me up" MAKES PATIENT HYPOTENSIVE   Oxycodone Hcl  Medications   (Not in a hospital admission)     Current Facility-Administered Medications:    sodium chloride flush (NS) 0.9 % injection 3 mL, 3 mL, Intravenous, Once, Sharyn Creamer, MD  Current Outpatient Medications:    albuterol (PROVENTIL HFA;VENTOLIN HFA) 108 (90 Base) MCG/ACT inhaler, Inhale 2 puffs into the lungs every 6 (six) hours as needed for wheezing or shortness of breath., Disp: 3 Inhaler, Rfl: 1   aspirin (ASPIRIN CHILDRENS) 81 MG chewable tablet, Chew 1 tablet (81 mg total) by mouth daily., Disp: 120 tablet, Rfl: 0   atorvastatin (LIPITOR) 80 MG tablet, Take 1 tablet (80 mg total) by mouth daily at 6 PM., Disp: 90 tablet, Rfl: 3   clopidogrel (PLAVIX) 75 MG tablet, Take 75 mg by mouth daily., Disp: , Rfl:    DULoxetine (CYMBALTA) 60 MG capsule, Take 1 capsule (60 mg total) by mouth at bedtime. (Patient taking differently: Take 60 mg by mouth daily. ), Disp: 30 capsule, Rfl: 11   ferrous sulfate 325 (65 FE) MG EC tablet, Take 1 tablet (325 mg total) by mouth 2 (two) times daily with a meal. (Patient taking differently: Take 325 mg by mouth daily. ), Disp: 60 tablet, Rfl: 0   fluticasone (FLONASE) 50 MCG/ACT nasal spray, Place 1 spray into both nostrils 2 (two) times daily as needed for allergies. , Disp: , Rfl:    folic acid (FOLVITE) 1 MG tablet, Take 1 tablet (1 mg total) by mouth daily., Disp: , Rfl:    furosemide (LASIX) 20 MG tablet, Take 20 mg by mouth daily. , Disp: , Rfl:    gabapentin (NEURONTIN) 800 MG tablet, Take 1  tablet (800 mg total) by mouth 2 (two) times daily., Disp: 180 tablet, Rfl: 1   glipiZIDE-metformin (METAGLIP) 5-500 MG tablet, Take 1 tablet by mouth 2 (two) times daily before a meal., Disp: 180 tablet, Rfl: 1   levETIRAcetam (KEPPRA) 500 MG tablet, Take 1 tablet (500 mg total) by mouth 2 (two) times daily., Disp: 60 tablet, Rfl: 0   levothyroxine (SYNTHROID) 125 MCG tablet, Take 125 mcg by mouth daily before breakfast., Disp: , Rfl:    Multiple Vitamin-Folic Acid TABS, Take 1 tablet by mouth daily., Disp: , Rfl:    nitroGLYCERIN (NITROSTAT) 0.4 MG SL tablet, Place 1 tablet (0.4 mg total) under the tongue every 5 (five) minutes as needed for chest pain., Disp: 30 tablet, Rfl: 0   omeprazole (PRILOSEC) 20 MG capsule, Take 20 mg by mouth daily. , Disp: , Rfl: 3   topiramate (TOPAMAX) 25 MG tablet, Take 25 mg by mouth 2 (two) times daily., Disp: , Rfl:    vitamin B-12 (CYANOCOBALAMIN) 1000 MCG tablet, Take 1,000 mcg by mouth daily., Disp: , Rfl:   Vitals   There were no vitals filed for this visit.   There is no height or weight on file to calculate BMI.  Physical Exam   Physical Exam Gen: alert, oriented to age but not month, follows simple commands HEENT: Atraumatic, normocephalic;mucous membranes moist; oropharynx clear, tongue without atrophy or fasciculations. Neck: Supple, trachea midline. Resp: CTAB, no w/r/r CV: RRR, no m/g/r; nml S1 and S2. 2+ symmetric peripheral pulses. Abd: soft/NT/ND; nabs x 4 quad Extrem: Nml bulk; no cyanosis, clubbing, or edema.  Neuro: *MS: alert, oriented to age but not month, follows simple commands *Speech: fluid, nondysarthric, able to name and repeat *CN:    I: Deferred   II,III: PERRLA, VFF by confrontation, optic discs unable to be visualized 2/2  pupillary constriction   III,IV,VI: EOMI w/o nystagmus, no ptosis   V: Sensation intact from V1 to V3 to LT   VII: Eyelid closure was full.  L UMN facial droop   VIII: Hearing intact to voice    IX,X: Voice normal, palate elevates symmetrically    XI: SCM/trap 5/5 bilat   XII: Tongue protrudes midline, no atrophy or fasciculations  *Motor:   Normal bulk.  No tremor, rigidity or bradykinesia. No drift RUE and RLE, drift but not to bed LUE and LLE. *Sensory: Sensory deficit to LT on L. Propioception intact bilat.  No double-simultaneous extinction.  *Coordination:  intention tremor and dysmetria without ataxia on FNF bilat *Reflexes:  1+ and symmetric throughout without clonus; toes down-going bilat *Gait: deferred  NIHSS = 5 (1 each for questions, facial palsy, LUE motor, LLE motor, sensory)  Premorbid mRS = 2   Labs   CBC: No results for input(s): "WBC", "NEUTROABS", "HGB", "HCT", "MCV", "PLT" in the last 168 hours.  Basic Metabolic Panel:  Lab Results  Component Value Date   NA 143 09/12/2019   K 3.4 (L) 09/12/2019   CO2 23 09/12/2019   GLUCOSE 96 09/12/2019   BUN 12 09/12/2019   CREATININE 0.58 09/12/2019   CALCIUM 8.3 (L) 09/12/2019   GFRNONAA >60 09/12/2019   GFRAA >60 09/12/2019   Lipid Panel:  Lab Results  Component Value Date   LDLCALC 84 10/16/2018   HgbA1c:  Lab Results  Component Value Date   HGBA1C 6.3 (H) 08/22/2019   Urine Drug Screen:     Component Value Date/Time   LABOPIA NONE DETECTED 03/13/2018 0943   LABOPIA NONE DETECTED 09/22/2017 1940   COCAINSCRNUR NONE DETECTED 03/13/2018 0943   LABBENZ NONE DETECTED 03/13/2018 0943   LABBENZ NONE DETECTED 09/22/2017 1940   AMPHETMU NONE DETECTED 03/13/2018 0943   AMPHETMU NONE DETECTED 09/22/2017 1940   THCU NONE DETECTED 03/13/2018 0943   THCU NONE DETECTED 09/22/2017 1940   LABBARB NONE DETECTED 03/13/2018 0943   LABBARB NONE DETECTED 09/22/2017 1940    Alcohol Level     Component Value Date/Time   ETH <10 03/13/2018 0826    CT Head without contrast: no acute process, but did show remote L parietal and bilateral cerebellar infarcts (personal review)   Impression   77 yo woman with  pmhx a fib off anticoagulation after GIB, COPD, CAD, DM2, hx CVA with residual L sided weakness who presents with acute worsening of chronic weakness of her L face/arm/leg c/f acute ischemic stroke vs TIA. TNK not administered 2/2 hx GIB (stroke team talked to GI about starting eliquis for a fib during previous admissions and GI felt she remained too high risk for bleeding to anticoagulate).   Recommendations   - Admit for stroke workup - Permissive HTN x48 hrs from sx onset or until stroke ruled out by MRI goal BP <220/110. PRN labetalol or hydralazine if BP above these parameters. Avoid oral antihypertensives. - MRI brain wo contrast - MRA H&N - TTE - Check A1c and LDL + add statin per guidelines - Continue home ASA 81mg  daily + plavix 75mg  daily - q4 hr neuro checks - STAT head CT for any change in neuro exam - Tele - PT/OT/SLP - Stroke education - Amb referral to neurology upon discharge - Amb referral to cardiology for consideration of watchman (this was previously planned but patient was lost to f/u)  Neurology will continue to follow.  ______________________________________________________________________   Thank you for the opportunity  to take part in the care of this patient. If you have any further questions, please contact the neurology consultation attending.  Signed,  Bing Neighbors, MD [facility] [phone]  If 7pm- 7am, please page neurology on call as listed in AMION.  **Any copied and pasted documentation in this note was written by me in another application not billed for and pasted by me into this document.

## 2022-05-09 NOTE — Assessment & Plan Note (Addendum)
Noted new onset left-sided facial weakness and left-sided hemiparesis since around 1030 this morning Noted baseline history of CVA w/ prior L sided weakness- on aspirin and Plavix Was not considered an anticoagulation candidate given history of GI bleeding in the past CT head grossly within normal limits Case discussed with Dr. Selina Cooley Will plan for formal CVA evaluation including MRI of the brain, CTA head and neck, 2D echo, restratification labs Continue aspirin Plavix for now Follow-up formal neurology recommendations

## 2022-05-09 NOTE — Assessment & Plan Note (Signed)
Hgb stable at 11.6

## 2022-05-09 NOTE — H&P (Signed)
History and Physical    Patient: Samantha Aguirre DOB: November 01, 1945 DOA: 05/09/2022 DOS: the patient was seen and examined on 05/09/2022 PCP: System, Provider Not In  Patient coming from: ALF/ILF  Chief Complaint:  Chief Complaint  Patient presents with   Code Stroke   HPI: Samantha Aguirre is a 77 y.o. female with medical history significant of asthma, atrial fibrillation, CHF, COPD, coronary artery disease, type 2 diabetes history of CVA with left-sided weakness, seizures presenting with CVA.  Patient reports developing sudden onset of left-sided weakness and left-sided numbness/facial droop.  Episode happened around 1030 this morning.  Minimal confusion.  No chest pain or shortness of breath.  No nausea or vomiting.  Noted remote history of CVA with left-sided weakness approximately 4 to 5 years ago.  Has had intermittent left-sided weakness since then.  Still smoking up to 1/4 to 1/2 pack/day.  No reported alcohol use.  Denies any loss of consciousness no seizure-like activity.  No abdominal pain.  Has been compliant with aspirin and Plavix daily. Presented to the ER afebrile, hemodynamically stable.  Code stroke initially called.  CT head grossly stable.  White count 8.6, hemoglobin 11.6, platelets 287, creatinine 0.75, potassium 3.1.  Dr. Selina Cooley with neurology evaluated patient with recommendation for inpatient admission and further evaluation.  Patient not tPA candidate in setting of history of GI bleeding in the past. Review of Systems: As mentioned in the history of present illness. All other systems reviewed and are negative. Past Medical History:  Diagnosis Date   Acid reflux    Arthritis    Bilateral knees, hands, feet   Asthma    Atrial fibrillation (HCC)    Cancer (HCC)    CHF (congestive heart failure) (HCC)    Diastolic CHF   COPD (chronic obstructive pulmonary disease) (HCC)    Coronary artery disease    Diabetes mellitus without complication (HCC)    Frequent  headaches    GI bleed    HLD (hyperlipidemia)    Hypertension    Seizures (HCC)    Skin cancer 2015   Suspected basal cell carcinoma on nose, surgically removed   Stroke (HCC) 04/2017   Thyroid disease    Tremors of nervous system    Past Surgical History:  Procedure Laterality Date   ABDOMINAL HYSTERECTOMY  1976   APPENDECTOMY  1980   BACK SURGERY     cardiac stents     CAROTID ENDARTERECTOMY     CHOLECYSTECTOMY  1980   COLONOSCOPY N/A 02/20/2017   Procedure: COLONOSCOPY;  Surgeon: Toney Reil, MD;  Location: [facility] ENDOSCOPY;  Service: Gastroenterology;  Laterality: N/A;   ESOPHAGOGASTRODUODENOSCOPY N/A 02/20/2017   Procedure: ESOPHAGOGASTRODUODENOSCOPY (EGD);  Surgeon: Toney Reil, MD;  Location: [facility] ENDOSCOPY;  Service: Gastroenterology;  Laterality: N/A;   ESOPHAGOGASTRODUODENOSCOPY (EGD) WITH PROPOFOL N/A 07/08/2017   Procedure: ESOPHAGOGASTRODUODENOSCOPY (EGD) WITH PROPOFOL;  Surgeon: Midge Minium, MD;  Location[facility] ENDOSCOPY;  Service: Endoscopy;  Laterality: N/A;   GALLBLADDER SURGERY  1980   GIVENS CAPSULE STUDY N/A 07/09/2017   Procedure: GIVENS CAPSULE STUDY;  Surgeon: Wyline Mood, MD;  Location: [facility] ENDOSCOPY;  Service: Gastroenterology;  Laterality: N/A;   KNEE SURGERY     left   KYPHOPLASTY N/A 03/16/2018   Procedure: KYPHOPLASTY, L5;  Surgeon: Kennedy Bucker, MD;  Location: [facility] ORS;  Service: Orthopedics;  Laterality: N/A;   REVERSE SHOULDER ARTHROPLASTY Right 08/25/2019   Procedure: REVERSE SHOULDER ARTHROPLASTY;  Surgeon: Christena Flake, MD;  Location: [facility] ORS;  Service:  Orthopedics;  Laterality: Right;   ROTATOR CUFF REPAIR     right   TONSILLECTOMY  1950   Social History:  reports that she has quit smoking. Her smoking use included cigarettes. She has a 53.00 pack-year smoking history. She has never used smokeless tobacco. She reports that she does not drink alcohol and does not use drugs.  Allergies  Allergen Reactions   Ivp Dye [Iodinated  Contrast Media] Itching    Pt injected with IV contrast.  5 min after injection pt complained of itching behind ear and on her abd.   Methotrexate Rash   Morphine And Related Other (See Comments)    "stops my breathing" NEAR-RESPIRATORY ARREST   Mushroom Extract Complex Anaphylaxis    Made patient "deathly sick"   Atenolol Other (See Comments)    "MD took me off of it bc it was doing something wrong" Made patient HYPOTENSIVE   Percocet [Oxycodone-Acetaminophen] Nausea And Vomiting and Other (See Comments)    Stomach pains   Percodan [Oxycodone-Aspirin] Nausea And Vomiting and Other (See Comments)    Stomach pains   Tramadol Nausea Only   Verapamil Other (See Comments)    " MD told me this was screwing me up" MAKES PATIENT HYPOTENSIVE   Oxycodone Hcl     Family History  Problem Relation Age of Onset   Breast cancer Mother    Heart disease Mother    Stroke Mother    Cancer Mother    COPD Mother    Diabetes Mother    Heart disease Father    Diabetes Father    Stroke Father    Alcohol abuse Sister    Drug abuse Sister    Stroke Sister    Cancer Sister    Mental illness Sister    Heart disease Brother    Arthritis Brother    Diabetes Brother    Heart disease Maternal Grandfather    Heart disease Paternal Grandfather     Prior to Admission medications   Medication Sig Start Date End Date Taking? Authorizing Provider  albuterol (PROVENTIL HFA;VENTOLIN HFA) 108 (90 Base) MCG/ACT inhaler Inhale 2 puffs into the lungs every 6 (six) hours as needed for wheezing or shortness of breath. 10/08/16   Karamalegos, Netta Neat, DO  aspirin (ASPIRIN CHILDRENS) 81 MG chewable tablet Chew 1 tablet (81 mg total) by mouth daily. 03/17/18   Adrian Saran, MD  atorvastatin (LIPITOR) 80 MG tablet Take 1 tablet (80 mg total) by mouth daily at 6 PM. 09/24/16   Karamalegos, Netta Neat, DO  clopidogrel (PLAVIX) 75 MG tablet Take 75 mg by mouth daily. 10/07/18   [provider]  DULoxetine  (CYMBALTA) 60 MG capsule Take 1 capsule (60 mg total) by mouth at bedtime. Patient taking differently: Take 60 mg by mouth daily.  09/24/16   Karamalegos, Netta Neat, DO  ferrous sulfate 325 (65 FE) MG EC tablet Take 1 tablet (325 mg total) by mouth 2 (two) times daily with a meal. Patient taking differently: Take 325 mg by mouth daily.  02/21/17   Milagros Loll, MD  fluticasone (FLONASE) 50 MCG/ACT nasal spray Place 1 spray into both nostrils 2 (two) times daily as needed for allergies.  05/24/19   [provider]  folic acid (FOLVITE) 1 MG tablet Take 1 tablet (1 mg total) by mouth daily. 09/13/19   Kathlen Mody, MD  furosemide (LASIX) 20 MG tablet Take 20 mg by mouth daily.  10/07/18   [provider]  gabapentin (NEURONTIN)  800 MG tablet Take 1 tablet (800 mg total) by mouth 2 (two) times daily. 09/24/16   Karamalegos, Netta Neat, DO  glipiZIDE-metformin (METAGLIP) 5-500 MG tablet Take 1 tablet by mouth 2 (two) times daily before a meal. 04/02/17   Karamalegos, Netta Neat, DO  levETIRAcetam (KEPPRA) 500 MG tablet Take 1 tablet (500 mg total) by mouth 2 (two) times daily. 09/19/15   Katha Hamming, MD  levothyroxine (SYNTHROID) 125 MCG tablet Take 125 mcg by mouth daily before breakfast. 06/16/19   [provider]  Multiple Vitamin-Folic Acid TABS Take 1 tablet by mouth daily.    [provider]  nitroGLYCERIN (NITROSTAT) 0.4 MG SL tablet Place 1 tablet (0.4 mg total) under the tongue every 5 (five) minutes as needed for chest pain. 02/28/16   Adrian Saran, MD  omeprazole (PRILOSEC) 20 MG capsule Take 20 mg by mouth daily.  09/02/17   [provider]  topiramate (TOPAMAX) 25 MG tablet Take 25 mg by mouth 2 (two) times daily. 10/07/18   [provider]  vitamin B-12 (CYANOCOBALAMIN) 1000 MCG tablet Take 1,000 mcg by mouth daily.    [provider]    Physical Exam: Vitals:   05/09/22 1324 05/09/22 1327 05/09/22 1330 05/09/22 1400  BP:    113/85 (!) 135/53  Pulse:   (!) 50   Resp:   17   Temp:  98.7 F (37.1 C) 98.6 F (37 C)   TempSrc:  Oral Oral   SpO2: 96%  96%    Physical Exam Constitutional:      Appearance: She is normal weight.  HENT:     Head: Normocephalic.     Nose: Nose normal.     Mouth/Throat:     Mouth: Mucous membranes are moist.  Eyes:     Pupils: Pupils are equal, round, and reactive to light.  Cardiovascular:     Rate and Rhythm: Normal rate and regular rhythm.  Pulmonary:     Effort: Pulmonary effort is normal.  Abdominal:     General: Bowel sounds are normal.  Musculoskeletal:        General: Normal range of motion.     Cervical back: Normal range of motion.  Neurological:     Comments: + L sided facial droop and L sided weakness    Psychiatric:        Mood and Affect: Mood normal.     Data Reviewed:  There are no new results to review at this time. CT HEAD CODE STROKE WO CONTRAST CLINICAL DATA:  Code stroke.  EXAM: CT HEAD WITHOUT CONTRAST  TECHNIQUE: Contiguous axial images were obtained from the base of the skull through the vertex without intravenous contrast.  RADIATION DOSE REDUCTION: This exam was performed according to the departmental dose-optimization program which includes automated exposure control, adjustment of the mA and/or kV according to patient size and/or use of iterative reconstruction technique.  COMPARISON:  04/22/2022  FINDINGS: Brain: No evidence of acute infarction, hemorrhage, mass, mass effect, or midline shift. No hydrocephalus or extra-axial collection. Redemonstrated remote left parietal and bilateral cerebellar infarcts. Periventricular white matter changes, likely the sequela of chronic small vessel ischemic disease.  Vascular: No hyperdense vessel.  Skull: Negative for fracture or focal lesion.  Sinuses/Orbits: No acute finding.  Other: The mastoid air cells are well aerated.  ASPECTS [facility] Stroke Program Early CT Score)  -  Ganglionic level infarction (caudate, lentiform nuclei, internal capsule, insula, M1-M3 cortex): 7  - Supraganglionic infarction (M4-M6 cortex): 3  Total score (0-10 with 10 being normal): 10  IMPRESSION: 1. No acute intracranial process. ASPECTS is 10. 2. Redemonstrated remote left parietal and bilateral cerebellar infarcts.  Imaging results were communicated on 05/09/2022 at 1:21 pm to provider STACK via secure text paging.  Electronically Signed   By: Wiliam Ke M.D.   On: 05/09/2022 13:21  Lab Results  Component Value Date   WBC 8.6 05/09/2022   HGB 11.6 (L) 05/09/2022   HCT 35.7 (L) 05/09/2022   MCV 93.5 05/09/2022   PLT 287 05/09/2022   Last metabolic panel Lab Results  Component Value Date   GLUCOSE 68 (L) 05/09/2022   NA 139 05/09/2022   K 3.1 (L) 05/09/2022   CL 104 05/09/2022   CO2 27 05/09/2022   BUN 18 05/09/2022   CREATININE 0.75 05/09/2022   GFRNONAA >60 05/09/2022   CALCIUM 8.3 (L) 05/09/2022   PHOS 3.8 07/06/2017   PROT 6.4 (L) 05/09/2022   ALBUMIN 3.5 05/09/2022   BILITOT 0.5 05/09/2022   ALKPHOS 101 05/09/2022   AST 18 05/09/2022   ALT 11 05/09/2022   ANIONGAP 8 05/09/2022    Assessment and Plan: * Suspected cerebrovascular accident (CVA) Noted new onset left-sided facial weakness and left-sided hemiparesis since around 1030 this morning Noted baseline history of CVA w/ prior L sided weakness- on aspirin and Plavix Was not considered an anticoagulation candidate given history of GI bleeding in the past CT head grossly within normal limits Case discussed with Dr. Selina Cooley Will plan for formal CVA evaluation including MRI of the brain, CTA head and neck, 2D echo, restratification labs Continue aspirin Plavix for now Follow-up formal neurology recommendations  Anemia Hgb stable at 11.6  Hypertension Allow for permissive hypertension in setting of CVA As needed labetalol for systolic pressure greater than 220 or diastolic pressure greater  than 110 Follow  Seizure disorder (HCC) Cont keppra and topamax   HLD (hyperlipidemia) Lipid panel High-dose statin  Tobacco abuse 1/4 to 1/2 pack/day smoker Discussed cessation Nicotine patch  COPD (chronic obstructive pulmonary disease) (HCC) Stable from respiratory standpoint Continue home regimen   Controlled type 2 diabetes mellitus with diabetic neuropathy (HCC) SSI A1c      Advance Care Planning:   Code Status: Full Code   Consults: Neurology w/ Dr. Selina Cooley   Family Communication: No family at the bedside   Severity of Illness: The appropriate patient status for this patient is OBSERVATION. Observation status is judged to be reasonable and necessary in order to provide the required intensity of service to ensure the patient's safety. The patient's presenting symptoms, physical exam findings, and initial radiographic and laboratory data in the context of their medical condition is felt to place them at decreased risk for further clinical deterioration. Furthermore, it is anticipated that the patient will be medically stable for discharge from the hospital within 2 midnights of admission.   Author: Floydene Flock, MD 05/09/2022 2:39 PM  For on call review [email or web address].

## 2022-05-09 NOTE — Assessment & Plan Note (Signed)
SSI A1c 

## 2022-05-09 NOTE — Assessment & Plan Note (Signed)
Stable from respiratory standpoint Continue home regimen 

## 2022-05-09 NOTE — Progress Notes (Signed)
   05/09/22 1700  Spiritual Encounters  Type of Visit Initial  Care provided to: Pt not available  Referral source Nurse (RN/NT/LPN)  Reason for visit Code  OnCall Visit Yes   Chaplain responded to code stroke. Patient in CT. Chaplain services are available for follow up as needed.

## 2022-05-10 ENCOUNTER — Observation Stay (HOSPITAL_BASED_OUTPATIENT_CLINIC_OR_DEPARTMENT_OTHER)
Admit: 2022-05-10 | Discharge: 2022-05-10 | Disposition: A | Discharge status: Home / Self Care | Payer: Medicare Other | Attending: Family Medicine | Admitting: Family Medicine

## 2022-05-10 ENCOUNTER — Encounter: Payer: Self-pay | Admitting: Family Medicine

## 2022-05-10 DIAGNOSIS — R569 Unspecified convulsions: Secondary | ICD-10-CM

## 2022-05-10 DIAGNOSIS — G459 Transient cerebral ischemic attack, unspecified: Secondary | ICD-10-CM

## 2022-05-10 LAB — BASIC METABOLIC PANEL
Anion gap: 6 (ref 5–15)
BUN: 14 mg/dL (ref 8–23)
CO2: 27 mmol/L (ref 22–32)
Calcium: 8.6 mg/dL — ABNORMAL LOW (ref 8.9–10.3)
Chloride: 106 mmol/L (ref 98–111)
Creatinine, Ser: 0.59 mg/dL (ref 0.44–1.00)
GFR, Estimated: >60 mL/min (ref >60)
Glucose, Bld: 120 mg/dL — ABNORMAL HIGH (ref 70–99)
Potassium: 3.7 mmol/L (ref 3.5–5.1)
Sodium: 139 mmol/L (ref 135–145)

## 2022-05-10 LAB — GLUCOSE, CAPILLARY
Glucose-Capillary: 131 mg/dL — ABNORMAL HIGH (ref 70–99)
Glucose-Capillary: 133 mg/dL — ABNORMAL HIGH (ref 70–99)
Glucose-Capillary: 156 mg/dL — ABNORMAL HIGH (ref 70–99)
Glucose-Capillary: 176 mg/dL — ABNORMAL HIGH (ref 70–99)

## 2022-05-10 LAB — LIPID PANEL
Cholesterol: 170 mg/dL (ref 0–200)
HDL: 39 mg/dL — ABNORMAL LOW (ref >40)
LDL Cholesterol: 91 mg/dL (ref 0–99)
Total CHOL/HDL Ratio: 4.4 RATIO
Triglycerides: 202 mg/dL — ABNORMAL HIGH (ref <150)
VLDL: 40 mg/dL (ref 0–40)

## 2022-05-10 LAB — ECHOCARDIOGRAM COMPLETE
Area-P 1/2: 3.42 cm2
Height: 61 in
S' Lateral: 2.9 cm
Weight: 2934.76 oz

## 2022-05-10 LAB — HEMOGLOBIN A1C
Hgb A1c MFr Bld: 5.8 % — ABNORMAL HIGH (ref 4.8–5.6)
Mean Plasma Glucose: 119.76 mg/dL

## 2022-05-10 LAB — MAGNESIUM: Magnesium: 2.1 mg/dL (ref 1.7–2.4)

## 2022-05-10 MED ORDER — ENOXAPARIN SODIUM 40 MG/0.4ML IJ SOSY
40.0000 mg | PREFILLED_SYRINGE | Freq: Every day | INTRAMUSCULAR | Status: DC
  Administered 2022-05-10: 40 mg via SUBCUTANEOUS
  Filled 2022-05-10: qty 0.4

## 2022-05-10 MED ORDER — ATORVASTATIN CALCIUM 20 MG PO TABS: 20.0000 mg | ORAL_TABLET | Freq: Every day | ORAL | Status: DC

## 2022-05-10 MED ORDER — ATORVASTATIN CALCIUM 20 MG PO TABS
40.0000 mg | ORAL_TABLET | Freq: Every day | ORAL | Status: DC
  Administered 2022-05-10: 40 mg via ORAL
  Filled 2022-05-10: qty 2

## 2022-05-10 MED ORDER — PERFLUTREN LIPID MICROSPHERE
1.0000 mL | INTRAVENOUS | Status: AC | PRN
  Administered 2022-05-10: 6 mL via INTRAVENOUS

## 2022-05-10 NOTE — Plan of Care (Signed)
l °

## 2022-05-10 NOTE — Progress Notes (Signed)
SLP Cancellation Note  Patient Details Name: Samantha Aguirre MRN: [id number] DOB: 1945/07/29   Cancelled treatment:       Reason Eval/Treat Not Completed:  (chart reviewed.) Pt was eating dinner meal. Spoke w/ pt briefly; she exhibited recall deficits, anomia during conversation but unsure how much is new vs old. MRI was "No evidence of acute abnormality. 2. Remote infarcts, detailed above.". 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 Pt's understanding of language is intact for brief, complex information with decreased comprehension of lengthier verbal or written info. ". Pt has also had left facial symmetry previously. In setting of pt's current admit, ST services will f/u w/ assessment in order to make appropriate recommendations for Discharge.       Jerilynn Som, MS, CCC-SLP Speech Language Pathologist Rehab Services; [facility] [phone] (ascom) Osborne Serio 05/10/2022, 5:59 PM

## 2022-05-10 NOTE — Progress Notes (Addendum)
Progress Note    Samantha Aguirre  ZOX:[id number] DOB: 05-04-1945  DOA: 05/09/2022 PCP: System, Provider Not In      Brief Narrative:    Medical records reviewed and are as summarized below:  Samantha Aguirre is a 77 y.o. female with medical history significant for vascular dementia, seizure disorder on Keppra, chronic diastolic CHF, TIA and history of stroke, CAD, atrial fibrillation not on anticoagulation because of history of GI bleed, history of angiodysplasia of stomach and duodenum, rheumatoid arthritis, migraine headaches, hypertension, hyperlipidemia, tobacco use disorder, COPD.  She was brought from [facility] facility because of left facial droop and left-sided weakness.       Assessment/Plan:   Principal Problem:   TIA (transient ischemic attack) Active Problems:   Type 2 diabetes mellitus with other specified complication (HCC)   COPD (chronic obstructive pulmonary disease) (HCC)   Tobacco abuse   HLD (hyperlipidemia)   Seizure (HCC)   Hypertension   History of stroke   Anemia   PAF (paroxysmal atrial fibrillation) (HCC)   Vascular dementia without behavioral disturbance (HCC)   Tobacco abuse counseling    Body mass index is 34.66 kg/m.    TIA in a patient with history of multiple TIAs and previous stroke: Left facial droop and left sided weakness, improved.  MRI brain did not show any evidence of acute stroke.  Hemoglobin A1c was 5.8.  LDL 91, HDL 39, triglycerides 202, total cholesterol 170. Case discussed with neurologist, Dr. Selina Cooley.  She recommended dual antiplatelet therapy with aspirin and Plavix for 21 days followed by Plavix monotherapy. Of note, patient used to take Plavix but she says she no longer takes it.  Her daughter confirmed that patient does not take Plavix and said she believes Plavix was discontinued about 2 years ago. 2D echo is pending. PT recommended home health therapy.   Seizure disorder: Continue Keppra.   She had no longer takes Topamax.   CAD, chronic diastolic CHF: Compensated   Paroxysmal atrial fibrillation: Patient is no longer on anticoagulation because of history of GI bleeding and high risk for recurrent GI bleeding   History of GI bleed (history of angiodysplasia of stomach and duodenum)   Other comorbidities include vascular dementia, COPD, hypertension, hyperlipidemia, type II DM, migraine headache, tobacco use disorder    Diet Order             Diet heart healthy/carb modified Room service appropriate? Yes; Fluid consistency: Thin  Diet effective now                            Consultants: Neurologist  Procedures: None    Medications:    aspirin  81 mg Oral Daily   atorvastatin  40 mg Oral q1800   clopidogrel  75 mg Oral Daily   DULoxetine  60 mg Oral Daily   enoxaparin (LOVENOX) injection  40 mg Subcutaneous QHS   ferrous sulfate  325 mg Oral Daily   insulin aspart  0-9 Units Subcutaneous TID WC   levETIRAcetam  500 mg Oral BID   levothyroxine  100 mcg Oral Q0600   pantoprazole  40 mg Oral Daily   Continuous Infusions:   Anti-infectives (From admission, onward)    None              Family Communication/Anticipated D/C date and plan/Code Status   DVT prophylaxis: enoxaparin (LOVENOX) injection 40 mg Start: 05/10/22 2200  Code Status: DNR  Family Communication: Plan discussed with Erskine Squibb, daughter, over the phone Disposition Plan: Plan to discharge back to assisted living facility today or tomorrow   Status is: Observation The patient remains OBS appropriate and will d/c before 2 midnights.       Subjective:   Interval events noted.  Earlier in the morning, she complained that "I just don't feel good".  However, later in the afternoon when I went back to see her, she was feeling better.  Objective:    Vitals:   05/10/22 0403 05/10/22 0743 05/10/22 1153 05/10/22 1214  BP: (!) 136/51 (!) 130/58 137/66    Pulse: (!) 48 (!) 54 (!) 54   Resp: 20 18 16    Temp: 98.7 F (37.1 C) 98.2 F (36.8 C) 98.3 F (36.8 C)   TempSrc:  Oral Oral   SpO2: 99% 100% 98%   Weight:    83.2 kg  Height:    5\' 1"  (1.549 m)   No data found.   Intake/Output Summary (Last 24 hours) at 05/10/2022 1559 Last data filed at 05/10/2022 1000 Gross per 24 hour  Intake 0 ml  Output --  Net 0 ml   Filed Weights   05/10/22 1214  Weight: 83.2 kg    Exam:  GEN: NAD SKIN: Warm and dry EYES: EOMI ENT: MMM CV: RRR PULM: CTA B ABD: soft, obese, NT, +BS CNS: AAO x 2 (person and place), non focal EXT: No edema or tenderness        Data Reviewed:   I have personally reviewed following labs and imaging studies:  Labs: Labs show the following:   Basic Metabolic Panel: Recent Labs  Lab 05/09/22 1304 05/10/22 0509  NA 139 139  K 3.1* 3.7  CL 104 106  CO2 27 27  GLUCOSE 68* 120*  BUN 18 14  CREATININE 0.75 0.59  CALCIUM 8.3* 8.6*  MG  --  2.1   GFR Estimated Creatinine Clearance: 56.7 mL/min (by C-G formula based on SCr of 0.59 mg/dL). Liver Function Tests: Recent Labs  Lab 05/09/22 1304  AST 18  ALT 11  ALKPHOS 101  BILITOT 0.5  PROT 6.4*  ALBUMIN 3.5   No results for input(s): "LIPASE", "AMYLASE" in the last 168 hours. No results for input(s): "AMMONIA" in the last 168 hours. Coagulation profile Recent Labs  Lab 05/09/22 1304  INR 1.0    CBC: Recent Labs  Lab 05/09/22 1304  WBC 8.6  NEUTROABS 5.4  HGB 11.6*  HCT 35.7*  MCV 93.5  PLT 287   Cardiac Enzymes: No results for input(s): "CKTOTAL", "CKMB", "CKMBINDEX", "TROPONINI" in the last 168 hours. BNP (last 3 results) No results for input(s): "PROBNP" in the last 8760 hours. CBG: Recent Labs  Lab 05/09/22 1303 05/09/22 1357 05/09/22 2125 05/10/22 0744 05/10/22 1153  GLUCAP 70 79 144* 133* 156*   D-Dimer: No results for input(s): "DDIMER" in the last 72 hours. Hgb A1c: Recent Labs    05/09/22 1304  HGBA1C  5.8*   Lipid Profile: Recent Labs    05/10/22 0511  CHOL 170  HDL 39*  LDLCALC 91  TRIG 161*  CHOLHDL 4.4   Thyroid function studies: No results for input(s): "TSH", "T4TOTAL", "T3FREE", "THYROIDAB" in the last 72 hours.  Invalid input(s): "FREET3" Anemia work up: No results for input(s): "VITAMINB12", "FOLATE", "FERRITIN", "TIBC", "IRON", "RETICCTPCT" in the last 72 hours. Sepsis Labs: Recent Labs  Lab 05/09/22 1304  WBC 8.6    Microbiology No results  found for this or any previous visit (from the past 240 hour(s)).  Procedures and diagnostic studies:  [name]  Result Date: 05/09/2022 CLINICAL DATA:  Neuro deficit, acute, stroke suspected; cva EXAM: MRI HEAD WITHOUT CONTRAST MRA HEAD WITHOUT CONTRAST MRA NECK WITHOUT AND WITH CONTRAST TECHNIQUE: Multiplanar, multiecho pulse sequences of the brain and surrounding structures were obtained without intravenous contrast. Angiographic images of the Circle of Willis were obtained using MRA technique without intravenous contrast. Angiographic images of the neck were obtained using MRA technique without and with intravenous contrast. Carotid stenosis measurements (when applicable) are obtained utilizing NASCET criteria, using the distal internal carotid diameter as the denominator. COMPARISON:  MRA and MRI October 16, 2018. FINDINGS: MRI HEAD FINDINGS Brain: Remote left posterior MCA and left PCA territory infarcts. Multiple remote bilateral cerebellar infarct. Additional scattered T2/FLAIR hyperintensities within the white matter, compatible with chronic microvascular ischemic disease. No evidence of acute infarct, acute hemorrhage, midline shift or hydrocephalus. Probable small (1 cm) dural-based meningioma at the vertex along the right falx (series 15, image 25) without significant mass effect. Cerebral atrophy. Vascular: See below. Skull and upper cervical spine: Normal marrow signal. Sinuses/Orbits: Clear sinuses.  No acute  orbital findings. Other: No mastoid effusions. MRA HEAD FINDINGS Motion limited study. Anterior circulation: Bilateral intracranial ICAs, MCAs, and ACAs are patent without proximal hemodynamically significant stenosis. Posterior circulation: Bilateral intradural vertebral arteries are patent. The non dominant left intradural vertebral artery makes a small contribution to the basilar and largely terminates as PICA, anatomic variant. Basilar artery and bilateral posterior cerebral arteries are patent. Possible moderate left PCA stenosis although motion limits assessment. Prominent posterior communicating arteries bilaterally with small vertebrobasilar system, anatomic variant. MRA NECK FINDINGS Aorta: Great vessel origins are patent without high-grade stenosis. Carotid systems: Atherosclerosis at the carotid bifurcations which is likely not hemodynamically significant. Vertebral arteries: Right dominant. Both vertebral arteries are patent without evidence of hemodynamically significant stenosis. Evaluation is limited at the origins due to artifact. IMPRESSION: MRI: 1. No evidence of acute abnormality. 2. Remote infarcts, detailed above. MRA head: 1. No large vessel occlusion. 2. Possible moderate left PCA stenosis, but motion limits assessment. MRA neck: No visible hemodynamically significant stenosis. Electronically Signed   By: Feliberto Harts M.D.   On: 05/09/2022 18:06   MR ANGIO HEAD WO CONTRAST  Result Date: 05/09/2022 CLINICAL DATA:  Neuro deficit, acute, stroke suspected; cva EXAM: MRI HEAD WITHOUT CONTRAST MRA HEAD WITHOUT CONTRAST MRA NECK WITHOUT AND WITH CONTRAST TECHNIQUE: Multiplanar, multiecho pulse sequences of the brain and surrounding structures were obtained without intravenous contrast. Angiographic images of the Circle of Willis were obtained using MRA technique without intravenous contrast. Angiographic images of the neck were obtained using MRA technique without and with intravenous  contrast. Carotid stenosis measurements (when applicable) are obtained utilizing NASCET criteria, using the distal internal carotid diameter as the denominator. COMPARISON:  MRA and MRI October 16, 2018. FINDINGS: MRI HEAD FINDINGS Brain: Remote left posterior MCA and left PCA territory infarcts. Multiple remote bilateral cerebellar infarct. Additional scattered T2/FLAIR hyperintensities within the white matter, compatible with chronic microvascular ischemic disease. No evidence of acute infarct, acute hemorrhage, midline shift or hydrocephalus. Probable small (1 cm) dural-based meningioma at the vertex along the right falx (series 15, image 25) without significant mass effect. Cerebral atrophy. Vascular: See below. Skull and upper cervical spine: Normal marrow signal. Sinuses/Orbits: Clear sinuses.  No acute orbital findings. Other: No mastoid effusions. MRA HEAD FINDINGS Motion limited study. Anterior circulation:  Bilateral intracranial ICAs, MCAs, and ACAs are patent without proximal hemodynamically significant stenosis. Posterior circulation: Bilateral intradural vertebral arteries are patent. The non dominant left intradural vertebral artery makes a small contribution to the basilar and largely terminates as PICA, anatomic variant. Basilar artery and bilateral posterior cerebral arteries are patent. Possible moderate left PCA stenosis although motion limits assessment. Prominent posterior communicating arteries bilaterally with small vertebrobasilar system, anatomic variant. MRA NECK FINDINGS Aorta: Great vessel origins are patent without high-grade stenosis. Carotid systems: Atherosclerosis at the carotid bifurcations which is likely not hemodynamically significant. Vertebral arteries: Right dominant. Both vertebral arteries are patent without evidence of hemodynamically significant stenosis. Evaluation is limited at the origins due to artifact. IMPRESSION: MRI: 1. No evidence of acute abnormality. 2. Remote  infarcts, detailed above. MRA head: 1. No large vessel occlusion. 2. Possible moderate left PCA stenosis, but motion limits assessment. MRA neck: No visible hemodynamically significant stenosis. Electronically Signed   By: Feliberto Harts M.D.   On: 05/09/2022 18:06   MR ANGIO NECK W WO CONTRAST  Result Date: 05/09/2022 CLINICAL DATA:  Neuro deficit, acute, stroke suspected; cva EXAM: MRI HEAD WITHOUT CONTRAST MRA HEAD WITHOUT CONTRAST MRA NECK WITHOUT AND WITH CONTRAST TECHNIQUE: Multiplanar, multiecho pulse sequences of the brain and surrounding structures were obtained without intravenous contrast. Angiographic images of the Circle of Willis were obtained using MRA technique without intravenous contrast. Angiographic images of the neck were obtained using MRA technique without and with intravenous contrast. Carotid stenosis measurements (when applicable) are obtained utilizing NASCET criteria, using the distal internal carotid diameter as the denominator. COMPARISON:  MRA and MRI October 16, 2018. FINDINGS: MRI HEAD FINDINGS Brain: Remote left posterior MCA and left PCA territory infarcts. Multiple remote bilateral cerebellar infarct. Additional scattered T2/FLAIR hyperintensities within the white matter, compatible with chronic microvascular ischemic disease. No evidence of acute infarct, acute hemorrhage, midline shift or hydrocephalus. Probable small (1 cm) dural-based meningioma at the vertex along the right falx (series 15, image 25) without significant mass effect. Cerebral atrophy. Vascular: See below. Skull and upper cervical spine: Normal marrow signal. Sinuses/Orbits: Clear sinuses.  No acute orbital findings. Other: No mastoid effusions. MRA HEAD FINDINGS Motion limited study. Anterior circulation: Bilateral intracranial ICAs, MCAs, and ACAs are patent without proximal hemodynamically significant stenosis. Posterior circulation: Bilateral intradural vertebral arteries are patent. The non dominant  left intradural vertebral artery makes a small contribution to the basilar and largely terminates as PICA, anatomic variant. Basilar artery and bilateral posterior cerebral arteries are patent. Possible moderate left PCA stenosis although motion limits assessment. Prominent posterior communicating arteries bilaterally with small vertebrobasilar system, anatomic variant. MRA NECK FINDINGS Aorta: Great vessel origins are patent without high-grade stenosis. Carotid systems: Atherosclerosis at the carotid bifurcations which is likely not hemodynamically significant. Vertebral arteries: Right dominant. Both vertebral arteries are patent without evidence of hemodynamically significant stenosis. Evaluation is limited at the origins due to artifact. IMPRESSION: MRI: 1. No evidence of acute abnormality. 2. Remote infarcts, detailed above. MRA head: 1. No large vessel occlusion. 2. Possible moderate left PCA stenosis, but motion limits assessment. MRA neck: No visible hemodynamically significant stenosis. Electronically Signed   By: Feliberto Harts M.D.   On: 05/09/2022 18:06   CT HEAD CODE STROKE WO CONTRAST  Result Date: 05/09/2022 CLINICAL DATA:  Code stroke. EXAM: CT HEAD WITHOUT CONTRAST TECHNIQUE: Contiguous axial images were obtained from the base of the skull through the vertex without intravenous contrast. RADIATION DOSE REDUCTION: This exam was performed according to  the departmental dose-optimization program which includes automated exposure control, adjustment of the mA and/or kV according to patient size and/or use of iterative reconstruction technique. COMPARISON:  04/22/2022 FINDINGS: Brain: No evidence of acute infarction, hemorrhage, mass, mass effect, or midline shift. No hydrocephalus or extra-axial collection. Redemonstrated remote left parietal and bilateral cerebellar infarcts. Periventricular white matter changes, likely the sequela of chronic small vessel ischemic disease. Vascular: No hyperdense  vessel. Skull: Negative for fracture or focal lesion. Sinuses/Orbits: No acute finding. Other: The mastoid air cells are well aerated. ASPECTS [facility] Stroke Program Early CT Score) - Ganglionic level infarction (caudate, lentiform nuclei, internal capsule, insula, M1-M3 cortex): 7 - Supraganglionic infarction (M4-M6 cortex): 3 Total score (0-10 with 10 being normal): 10 IMPRESSION: 1. No acute intracranial process. ASPECTS is 10. 2. Redemonstrated remote left parietal and bilateral cerebellar infarcts. Imaging results were communicated on 05/09/2022 at 1:21 pm to provider STACK via secure text paging. Electronically Signed   By: Wiliam Ke M.D.   On: 05/09/2022 13:21               LOS: 0 days   Richad Ramsay  [facility]   Pager on [email or web address]. If 7PM-7AM, please contact night-coverage at [email or web address]     05/10/2022, 3:59 PM

## 2022-05-10 NOTE — Progress Notes (Addendum)
Patient admitted for CVA. HR 41-50 so far this shift on tele. Patient has been SB since arrival to the unit.   RN notified Manuela Schwartz, Np of the above per the order to notify physician if HR<50

## 2022-05-10 NOTE — Progress Notes (Signed)
       CROSS COVER NOTE  NAME: Samantha Aguirre MRN: [id number] DOB : 10-13-1945    HPI/Events of Note   Report:patient unable to identify feather cactus and hammock with aphasia assessment part of [facility] monitoring  On review of chart:MRI negative for acute stroke    Assessment and  Interventions   Assessment: Patient was able to identify pen shoe and phone Plan: Discontinue nih moitoring     Donnie Mesa NP [facility]

## 2022-05-10 NOTE — Plan of Care (Addendum)
Neurology plan of care  Please see neurology consult note from yesterday for initial findings and recommendations. MRI brain showed no e/o acute infarct (personal review). MRA H&N showed possible moderate L PCA stenosis with no other hemodynamically-significant stenosis. TTE is pending. A1c 5.8. LDL 91. Etiology of symptoms felt to be TIA.  Updated recommendations: - F/u TTE - if no intracardiac clot or other sig abnl, TIA workup will be completed - Patient is not on anticoagulation for a fib 2/2 prior hx GIB (stroke team talked to GI about starting eliquis for a fib during previous admissions and GI felt she remained too high risk for bleeding to anticoagulate) - Please place ambulatory referral to cardiology at discharge for consideration of watchman device. This was previously considered but she was lost to f/u - ASA 81mg  daily + plavix 75mg  daily x21 days f/b plavix 75mg  daily monotherapy after that (daughter confirmed with Dr. Myriam Forehand that patient was on aspirin only prior to admission) - Atorvastatin 40mg  daily - Patient may follow up with established outpatient neurologist Dr. Antonietta Barcelona  Neurology will be available prn for questions going forward.   Bing Neighbors, MD [facility] [phone]  If 7pm- 7am, please page neurology on call as listed in AMION.

## 2022-05-10 NOTE — Evaluation (Signed)
Occupational Therapy Evaluation Patient Details Name: Samantha Aguirre MRN: [id number] DOB: [birth year] Today's Date: 05/10/2022   History of Present Illness Pt is a 77 y.o. female with PMH that includes: atrial fibrillation, CHF, COPD, DM, and prior anticoagulant use discontinued in the setting of suspected GI bleeding.  Pt presented to the ED with LUE and left facial weakness.  Per head CT impression: No acute intracranial process, redemonstrated remote left parietal and bilateral cerebellar infarcts.   Clinical Impression    Pt in bed upon arrival - motivated and willing to work with OT. Just finished breakfast- with no issues. Per pt numbness and ROM improving since yesterday. Pt ind with supervision supine to EOB and ambulate with RW into bathroom with good balance and no LOB-independent in LB dressing , toileting hygiene and clothing.  Pt with residual R sided numbness and decrease shoulder AROM but per pt had prior - numbness and weakness decrease in L - now only some numbness per pt in fingers and decrease coordination. Pt do have tremors in bilateral UE. Per pt she feels close to her baseline and hope she get discharge today.  Pt will benefit from  [facility] consult because of hx of falls and decrease coordination in L hand more than R . Pt can benefit from cont skilled OT services to return to prior level of function      Recommendations for follow up therapy are one component of a multi-disciplinary discharge planning process, led by the attending physician.  Recommendations may be updated based on patient status, additional functional criteria and insurance authorization.   Assistance Recommended at Discharge  ALF - HHOT and PT  Patient can return home with the following  HHOT    Functional Status Assessment     Equipment Recommendations       Recommendations for Other Services       Precautions / Restrictions Precautions Precautions: Fall Restrictions Weight Bearing Restrictions:  No      Mobility Bed Mobility Overal bed mobility: Independent                  Transfers   Equipment used: Rolling walker (2 wheels)   Sit to Stand: Supervision           General transfer comment: no LOB using RW      Balance Overall balance assessment: Needs assistance   Sitting balance-Leahy Scale: Good     Standing balance support: Bilateral upper extremity supported, During functional activity Standing balance-Leahy Scale: Good                             ADL either performed or assessed with clinical judgement   ADL                                         General ADL Comments: Pt sitting EOB able to do foot wear with no LOB-  UB and LB dressing Ind with setup - Ambulate to bathroom with no LOB using RW and hygiene, clothing mangament ind with supervision     Vision Patient Visual Report: No change from baseline       Perception     Praxis      Pertinent Vitals/Pain Pain Assessment Pain Location: Bilateral shoulders - no nr provided Pain Descriptors / Indicators: Sore, Aching     Hand Dominance Right  Extremity/Trunk Assessment Upper Extremity Assessment Upper Extremity Assessment: Defer to OT evaluation           Communication Communication Communication: No difficulties   Cognition Arousal/Alertness: Awake/alert Behavior During Therapy: WFL for tasks assessed/performed Overall Cognitive Status: Within Functional Limits for tasks assessed                                       General Comments       Exercises Other Exercises Other Exercises: UE AROM WFL but limited bilaterel shoulder AROM R worse than L - limited  in past and soreness in shoulders per pt - numbness reported but improving -now only in digits on L - strength in AROM 4/5 but decrease coordination in L hand   Shoulder Instructions      Home Living Family/patient expects to be discharged to:: Assisted living                              Home Equipment: Rollator (4 wheels);BSC/3in1;Shower seat - built in   Additional Comments: Pt is a resident at [facility] in [place]      Prior Functioning/Environment Prior Level of Function : Independent/Modified Independent             Mobility Comments: Mod Ind amb facility distances with a rollator, 3 falls in the last 6 months all from LOB ADLs Comments: Ind with ADLs        OT Problem List: Decreased strength;Decreased range of motion;Impaired balance (sitting and/or standing);Decreased safety awareness;Impaired UE functional use      OT Treatment/Interventions: Self-care/ADL training;Therapeutic exercise;Splinting;Patient/family education;Balance training    OT Goals(Current goals can be found in the care plan section) Acute Rehab OT Goals Patient Stated Goal: Go back to [facility] OT Goal Formulation: With patient Time For Goal Achievement: 05/17/22 Potential to Achieve Goals: Good  OT Frequency: Min 1X/week    Co-evaluation              AM-PAC OT "6 Clicks" Daily Activity     Outcome Measure Help from another person eating meals?: None Help from another person taking care of personal grooming?: A Little Help from another person toileting, which includes using toliet, bedpan, or urinal?: None Help from another person bathing (including washing, rinsing, drying)?: None Help from another person to put on and taking off regular upper body clothing?: A Little Help from another person to put on and taking off regular lower body clothing?: None 6 Click Score: 22   End of Session Equipment Utilized During Treatment: Gait belt  Activity Tolerance: Patient tolerated treatment well Patient left: in bed;with bed alarm set  OT Visit Diagnosis: History of falling (Z91.81);Muscle weakness (generalized) (M62.81)                Time: 0930-1000 OT Time Calculation (min): 30 min Charges:  OT General Charges $OT Visit: 1  Visit OT Evaluation $OT Eval Low Complexity: 1 Low OT Treatments $Self Care/Home Management : 8-22 mins   [name] OTR/L,CLT 05/10/2022, 12:47 PM

## 2022-05-10 NOTE — Progress Notes (Signed)
Patient had a change in her NIH for aphasia when looking at the pictures she is unable to identify feather, cactus and hammock.  RN notified Manuela Schwartz, NP via secure chat and text page via amion.  Order received: Repeat NIH in 15 min and notify NP of the results

## 2022-05-10 NOTE — Progress Notes (Signed)
Patient is now unable to tell RN "telephone, cactus, hammock, glove" but she did get feather correct. Patient is getting increasingly agitated when asked the orientation questions for instance when "she got the year wrong and  I told her the correct year"  she states "that is what I just said !" When asked what the month is she states "march" then thinks about it and self corrects to April and she does the same thing with her age.  RN notified Manuela Schwartz, NP of the above information.   RN was told by the NP the Stroke is ruled out by MRI and the patient received Keppra last night.    Orders Received: NIH stroke scale discontinued

## 2022-05-10 NOTE — Plan of Care (Signed)
  Problem: Education: Goal: Knowledge of disease or condition will improve Outcome: Progressing Goal: Knowledge of secondary prevention will improve (MUST DOCUMENT ALL) Outcome: Progressing Goal: Knowledge of patient specific risk factors will improve Samantha Aguirre N/A or DELETE if not current risk factor) Outcome: Progressing   Problem: Ischemic Stroke/TIA Tissue Perfusion: Goal: Complications of ischemic stroke/TIA will be minimized Outcome: Progressing   Problem: Coping: Goal: Will verbalize positive feelings about self Outcome: Progressing   Problem: Self-Care: Goal: Ability to participate in self-care as condition permits will improve Outcome: Progressing Goal: Verbalization of feelings and concerns over difficulty with self-care will improve Outcome: Progressing Goal: Ability to communicate needs accurately will improve Outcome: Progressing   Problem: Nutrition: Goal: Risk of aspiration will decrease Outcome: Progressing Goal: Dietary intake will improve Outcome: Progressing   Problem: Coping: Goal: Ability to adjust to condition or change in health will improve Outcome: Progressing   Problem: Fluid Volume: Goal: Ability to maintain a balanced intake and output will improve Outcome: Progressing   Problem: Metabolic: Goal: Ability to maintain appropriate glucose levels will improve Outcome: Progressing   Problem: Nutritional: Goal: Maintenance of adequate nutrition will improve Outcome: Progressing   Problem: Skin Integrity: Goal: Risk for impaired skin integrity will decrease Outcome: Progressing   Problem: Clinical Measurements: Goal: Respiratory complications will improve Outcome: Progressing Goal: Cardiovascular complication will be avoided Outcome: Progressing   Problem: Activity: Goal: Risk for activity intolerance will decrease Outcome: Progressing   Problem: Coping: Goal: Level of anxiety will decrease Outcome: Progressing   Problem:  Elimination: Goal: Will not experience complications related to bowel motility Outcome: Progressing Goal: Will not experience complications related to urinary retention Outcome: Progressing   Problem: Safety: Goal: Ability to remain free from injury will improve Outcome: Progressing   Problem: Skin Integrity: Goal: Risk for impaired skin integrity will decrease Outcome: Progressing

## 2022-05-10 NOTE — Progress Notes (Signed)
Patient was able to read all of the words except "huckleberry" which she was able to get correct during my last 2 assessments. Patient is still unable to express "cactus, hammock and feather". The Patient doesn't really explain what is happening in the photo and she just tells the RN the objects in the photo.  Patient did state that her decreased sensation on the L side of her body is getting better, however, she has a slight tremor to her L hand while she held her arms up, which is new for this RN tonight,  but she states it is normal for her to have the tremor.   RN notified Manuela Schwartz, NP of the above information.   NP told RN to ask the patient to identify RN's glasses, a pen and a phone.   Patient identified all items correctly. RN notified NP of this information

## 2022-05-11 DIAGNOSIS — I480 Paroxysmal atrial fibrillation: Secondary | ICD-10-CM | POA: Diagnosis not present

## 2022-05-11 DIAGNOSIS — F0150 Vascular dementia without behavioral disturbance: Secondary | ICD-10-CM

## 2022-05-11 DIAGNOSIS — G459 Transient cerebral ischemic attack, unspecified: Secondary | ICD-10-CM | POA: Diagnosis not present

## 2022-05-11 LAB — GLUCOSE, CAPILLARY
Glucose-Capillary: 142 mg/dL — ABNORMAL HIGH (ref 70–99)
Glucose-Capillary: 140 mg/dL — ABNORMAL HIGH (ref 70–99)

## 2022-05-11 MED ORDER — ASPIRIN 81 MG PO CHEW: 81.0000 mg | CHEWABLE_TABLET | Freq: Every day | ORAL | Status: AC

## 2022-05-11 MED ORDER — FERROUS SULFATE 325 (65 FE) MG PO TBEC: 325.0000 mg | DELAYED_RELEASE_TABLET | Freq: Every day | ORAL | Status: AC

## 2022-05-11 MED ORDER — DULOXETINE HCL 60 MG PO CPEP
60.0000 mg | ORAL_CAPSULE | Freq: Every day | ORAL | Status: AC
Start: 2022-05-11 — End: ?

## 2022-05-11 MED ORDER — CLOPIDOGREL BISULFATE 75 MG PO TABS: 75.0000 mg | ORAL_TABLET | Freq: Every day | ORAL | 0 refills | Status: AC

## 2022-05-11 MED ORDER — GLIPIZIDE-METFORMIN HCL 5-500 MG PO TABS
1.0000 | ORAL_TABLET | Freq: Every day | ORAL | Status: DC
Start: 2022-05-11 — End: 2023-10-27

## 2022-05-11 NOTE — Discharge Summary (Addendum)
Physician Discharge Summary   Patient: Samantha Aguirre MRN: [id number] DOB: 1945/09/02  Admit date:     05/09/2022  Discharge date: 05/11/22  Discharge Physician: Lurene Shadow   PCP: System, Provider Not In   Recommendations at discharge:   Follow-up with Dr. Cristopher Peru, neurologist, within 1 month of discharge. Follow-up with PCP in 1 to 2 weeks  Discharge Diagnoses: Principal Problem:   TIA (transient ischemic attack) Active Problems:   Type 2 diabetes mellitus with other specified complication (HCC)   COPD (chronic obstructive pulmonary disease) (HCC)   Tobacco abuse   HLD (hyperlipidemia)   Seizure (HCC)   Hypertension   History of stroke   Anemia   PAF (paroxysmal atrial fibrillation) (HCC)   Vascular dementia without behavioral disturbance (HCC)   Tobacco abuse counseling  Resolved Problems:   * No resolved hospital problems. *  Hospital Course:  Samantha Aguirre is a 77 y.o. female with medical history significant for vascular dementia, seizure disorder on Keppra, chronic diastolic CHF, TIA and history of stroke, CAD, atrial fibrillation not on anticoagulation because of history of GI bleed, history of angiodysplasia of stomach and duodenum, rheumatoid arthritis, migraine headaches, hypertension, hyperlipidemia, tobacco use disorder, COPD.  She was brought from [facility] facility because of left facial droop and left-sided weakness.   MRI brain did not show any evidence of acute stroke but there was evidence of old infarcts.  She was diagnosed with TIA.  Neurologist recommended dual antiplatelet therapy with aspirin and Plavix for 21 days followed by Plavix monotherapy.    Assessment and Plan:  TIA in a patient with history of multiple TIAs and previous stroke: Left facial droop and left sided weakness have improved.  MRI brain did not show any evidence of acute stroke.  Hemoglobin A1c was 5.8.  LDL 91, HDL 39, triglycerides 202, total  cholesterol 170. Case discussed with neurologist, Dr. Selina Cooley.  She recommended dual antiplatelet therapy with aspirin and Plavix for 21 days followed by Plavix monotherapy. Of note, patient used to take Plavix but she says she no longer takes it.  Her daughter confirmed that patient does not take Plavix and said she believes Plavix was discontinued about 2 years ago. 2D echo showed EF estimated at 60 to 65%, grade 1 diastolic dysfunction, mild mitral regurgitation. PT recommended home health therapy.     Seizure disorder: Continue Keppra.  She no longer takes Topamax.     CAD, chronic diastolic CHF: Compensated     Paroxysmal atrial fibrillation: Patient is no longer on anticoagulation because of history of GI bleeding and high risk for recurrent GI bleeding      Other comorbidities include vascular dementia, history of GI bleed (history of angiodysplasia of stomach and duodenum) COPD, hypertension, hyperlipidemia, type II DM, migraine headache, tobacco use disorder     Her condition is improved and she is deemed stable for discharge today.       Consultants: Neurologist Procedures performed: None  Disposition: Assisted living Diet recommendation:  Cardiac and Carb modified diet DISCHARGE MEDICATION: Allergies as of 05/11/2022       Reactions   Ivp Dye [iodinated Contrast Media] Itching   Pt injected with IV contrast.  5 min after injection pt complained of itching behind ear and on her abd.   Methotrexate Rash   Morphine And Related Other (See Comments)   "stops my breathing" NEAR-RESPIRATORY ARREST   Mushroom Extract Complex Anaphylaxis   Made patient "deathly sick"   Atenolol Other (  See Comments)   "MD took me off of it bc it was doing something wrong" Made patient HYPOTENSIVE   Percocet [oxycodone-acetaminophen] Nausea And Vomiting, Other (See Comments)   Stomach pains   Percodan [oxycodone-aspirin] Nausea And Vomiting, Other (See Comments)   Stomach pains    Tramadol Nausea Only   Verapamil Other (See Comments)   " MD told me this was screwing me up" MAKES PATIENT HYPOTENSIVE   Oxycodone Hcl         Medication List     STOP taking these medications    fluticasone 50 MCG/ACT nasal spray Commonly known as: FLONASE   ibuprofen 600 MG tablet Commonly known as: ADVIL   Multiple Vitamin-Folic Acid Tabs   nitroGLYCERIN 0.4 MG SL tablet Commonly known as: NITROSTAT   topiramate 25 MG tablet Commonly known as: TOPAMAX       TAKE these medications    acetaminophen 500 MG tablet Commonly known as: TYLENOL Take 500 mg by mouth 3 (three) times daily.   albuterol 108 (90 Base) MCG/ACT inhaler Commonly known as: VENTOLIN HFA Inhale 2 puffs into the lungs every 6 (six) hours as needed for wheezing or shortness of breath.   aspirin 81 MG chewable tablet Commonly known as: Aspirin Childrens Chew 1 tablet (81 mg total) by mouth daily for 20 days.   atorvastatin 10 MG tablet Commonly known as: LIPITOR Take 10 mg by mouth daily.   Cholecalciferol 50 MCG (2000 UT) Tabs Take 2,000 Units by mouth daily.   clopidogrel 75 MG tablet Commonly known as: PLAVIX Take 1 tablet (75 mg total) by mouth daily.   cyanocobalamin 1000 MCG tablet Commonly known as: VITAMIN B12 Take 1,000 mcg by mouth daily.   DULoxetine 60 MG capsule Commonly known as: CYMBALTA Take 1 capsule (60 mg total) by mouth daily.   ferrous sulfate 325 (65 FE) MG EC tablet Take 1 tablet (325 mg total) by mouth daily.   folic acid 1 MG tablet Commonly known as: FOLVITE Take 1 tablet (1 mg total) by mouth daily.   furosemide 20 MG tablet Commonly known as: LASIX Take 20 mg by mouth daily.   gabapentin 300 MG capsule Commonly known as: NEURONTIN Take 300 mg by mouth 3 (three) times daily.   glipiZIDE-metformin 5-500 MG tablet Commonly known as: METAGLIP Take 1 tablet by mouth daily.   hydrocortisone cream 1 % Apply 1 Application topically 2 (two) times  daily as needed for itching.   levETIRAcetam 500 MG tablet Commonly known as: KEPPRA Take 1 tablet (500 mg total) by mouth 2 (two) times daily.   levothyroxine 100 MCG tablet Commonly known as: SYNTHROID Take 125 mcg by mouth daily before breakfast.   lisinopril 10 MG tablet Commonly known as: ZESTRIL Take 10 mg by mouth daily.   magnesium oxide 400 MG tablet Commonly known as: MAG-OX Take 400 mg by mouth daily.   Melatonin 10 MG Tabs Take 10 mg by mouth at bedtime.   nystatin powder Commonly known as: MYCOSTATIN/NYSTOP Apply 1 Application topically 2 (two) times daily as needed (irritation).   omeprazole 40 MG capsule Commonly known as: PRILOSEC Take 20 mg by mouth daily.   ondansetron 4 MG tablet Commonly known as: ZOFRAN Take 4 mg by mouth every 6 (six) hours as needed for nausea.   Voltaren 1 % Gel Generic drug: diclofenac Sodium Apply 2 g topically 3 (three) times daily.        Follow-up Information     Lonell Face,  MD. Schedule an appointment as soon as possible for a visit in 1 month(s).   Specialty: Neurology Contact information: [phone] [facility] [address] [facility] [place] [address] [phone]                Discharge Exam: Ceasar Mons Weights   05/10/22 1214  Weight: 83.2 kg   GEN: NAD SKIN: Warm and dry EYES: No pallor or icterus ENT: MMM CV: RRR PULM: CTA B ABD: soft, ND, NT, +BS CNS: AAO x 2 (person and place), non focal EXT: No edema or tenderness   Condition at discharge: good  The results of significant diagnostics from this hospitalization (including imaging, microbiology, ancillary and laboratory) are listed below for reference.   Imaging Studies: ECHOCARDIOGRAM COMPLETE  Result Date: 05/10/2022    ECHOCARDIOGRAM REPORT   Patient Name:   SHIRRELL SOLINGER Date of Exam: 05/10/2022 Medical Rec #:  [id number]        Height:       62.0 in Accession #:    [id number]       Weight:       160.0 lb Date of  Birth:  [birth year]        BSA:          1.739 m Patient Age:    77 years         BP:           137/66 mmHg Patient Gender: F                HR:           45 bpm. Exam Location:  [facility] Procedure: 2D Echo, Color Doppler, Cardiac Doppler and Intracardiac            Opacification Agent Indications:    TIA G45.9  History:        Patient has prior history of Echocardiogram examinations, most                 recent 09/07/2019. COPD, Arrythmias:Atrial Fibrillation; Risk                 Factors:Diabetes, Hypertension and Dyslipidemia.  Sonographer:    Louie Boston RDCS Referring Phys: [phone] STEVEN J NEWTON  Sonographer Comments: No subcostal window and suboptimal apical window. IMPRESSIONS  1. Left ventricular ejection fraction, by estimation, is 60 to 65%. The left ventricle has normal function. The left ventricle has no regional wall motion abnormalities. Left ventricular diastolic parameters are consistent with Grade I diastolic dysfunction (impaired relaxation).  2. Right ventricular systolic function is normal. The right ventricular size is normal. There is normal pulmonary artery systolic pressure. The estimated right ventricular systolic pressure is 24.9 mmHg.  3. The mitral valve is normal in structure. Mild mitral valve regurgitation. No evidence of mitral stenosis.  4. The aortic valve is normal in structure. Aortic valve regurgitation is not visualized. No aortic stenosis is present.  5. The inferior vena cava is normal in size with greater than 50% respiratory variability, suggesting right atrial pressure of 3 mmHg. FINDINGS  Left Ventricle: Left ventricular ejection fraction, by estimation, is 60 to 65%. The left ventricle has normal function. The left ventricle has no regional wall motion abnormalities. Definity contrast agent was given IV to delineate the left ventricular  endocardial borders. The left ventricular internal cavity size was normal in size. There is no left ventricular hypertrophy. Left ventricular  diastolic parameters are consistent with Grade I diastolic dysfunction (impaired relaxation). Right Ventricle: The right  ventricular size is normal. No increase in right ventricular wall thickness. Right ventricular systolic function is normal. There is normal pulmonary artery systolic pressure. The tricuspid regurgitant velocity is 2.23 m/s, and  with an assumed right atrial pressure of 5 mmHg, the estimated right ventricular systolic pressure is 24.9 mmHg. Left Atrium: Left atrial size was normal in size. Right Atrium: Right atrial size was normal in size. Pericardium: There is no evidence of pericardial effusion. Mitral Valve: The mitral valve is normal in structure. Mild mitral annular calcification. Mild mitral valve regurgitation. No evidence of mitral valve stenosis. Tricuspid Valve: The tricuspid valve is normal in structure. Tricuspid valve regurgitation is not demonstrated. No evidence of tricuspid stenosis. Aortic Valve: The aortic valve is normal in structure. Aortic valve regurgitation is not visualized. No aortic stenosis is present. Pulmonic Valve: The pulmonic valve was normal in structure. Pulmonic valve regurgitation is not visualized. No evidence of pulmonic stenosis. Aorta: The aortic root is normal in size and structure. Venous: The inferior vena cava is normal in size with greater than 50% respiratory variability, suggesting right atrial pressure of 3 mmHg. IAS/Shunts: No atrial level shunt detected by color flow Doppler.  LEFT VENTRICLE PLAX 2D LVIDd:         4.40 cm   Diastology LVIDs:         2.90 cm   LV e' medial:    6.42 cm/s LV PW:         1.00 cm   LV E/e' medial:  12.2 LV IVS:        1.00 cm   LV e' lateral:   10.30 cm/s LVOT diam:     2.00 cm   LV E/e' lateral: 7.6 LV SV:         81 LV SV Index:   46 LVOT Area:     3.14 cm  RIGHT VENTRICLE RV Basal diam:  3.00 cm RV S prime:     17.70 cm/s TAPSE (M-mode): 2.0 cm LEFT ATRIUM             Index        RIGHT ATRIUM           Index LA  diam:        3.30 cm 1.90 cm/m   RA Area:     14.10 cm LA Vol (A2C):   47.0 ml 27.03 ml/m  RA Volume:   37.40 ml  21.51 ml/m LA Vol (A4C):   30.0 ml 17.25 ml/m LA Biplane Vol: 41.6 ml 23.93 ml/m  AORTIC VALVE LVOT Vmax:   108.00 cm/s LVOT Vmean:  69.700 cm/s LVOT VTI:    0.257 m  AORTA Ao Root diam: 3.10 cm Ao Asc diam:  3.80 cm MITRAL VALVE               TRICUSPID VALVE MV Area (PHT): 3.42 cm    TR Peak grad:   19.9 mmHg MV Decel Time: 222 msec    TR Vmax:        223.00 cm/s MV E velocity: 78.10 cm/s MV A velocity: 80.20 cm/s  SHUNTS MV E/A ratio:  0.97        Systemic VTI:  0.26 m                            Systemic Diam: 2.00 cm Julien Nordmann MD Electronically signed by Julien Nordmann MD Signature Date/Time: 05/10/2022/5:04:01 PM    Final  [name]  Result Date: 05/09/2022 CLINICAL DATA:  Neuro deficit, acute, stroke suspected; cva EXAM: MRI HEAD WITHOUT CONTRAST MRA HEAD WITHOUT CONTRAST MRA NECK WITHOUT AND WITH CONTRAST TECHNIQUE: Multiplanar, multiecho pulse sequences of the brain and surrounding structures were obtained without intravenous contrast. Angiographic images of the Circle of Willis were obtained using MRA technique without intravenous contrast. Angiographic images of the neck were obtained using MRA technique without and with intravenous contrast. Carotid stenosis measurements (when applicable) are obtained utilizing NASCET criteria, using the distal internal carotid diameter as the denominator. COMPARISON:  MRA and MRI October 16, 2018. FINDINGS: MRI HEAD FINDINGS Brain: Remote left posterior MCA and left PCA territory infarcts. Multiple remote bilateral cerebellar infarct. Additional scattered T2/FLAIR hyperintensities within the white matter, compatible with chronic microvascular ischemic disease. No evidence of acute infarct, acute hemorrhage, midline shift or hydrocephalus. Probable small (1 cm) dural-based meningioma at the vertex along the right falx (series 15,  image 25) without significant mass effect. Cerebral atrophy. Vascular: See below. Skull and upper cervical spine: Normal marrow signal. Sinuses/Orbits: Clear sinuses.  No acute orbital findings. Other: No mastoid effusions. MRA HEAD FINDINGS Motion limited study. Anterior circulation: Bilateral intracranial ICAs, MCAs, and ACAs are patent without proximal hemodynamically significant stenosis. Posterior circulation: Bilateral intradural vertebral arteries are patent. The non dominant left intradural vertebral artery makes a small contribution to the basilar and largely terminates as PICA, anatomic variant. Basilar artery and bilateral posterior cerebral arteries are patent. Possible moderate left PCA stenosis although motion limits assessment. Prominent posterior communicating arteries bilaterally with small vertebrobasilar system, anatomic variant. MRA NECK FINDINGS Aorta: Great vessel origins are patent without high-grade stenosis. Carotid systems: Atherosclerosis at the carotid bifurcations which is likely not hemodynamically significant. Vertebral arteries: Right dominant. Both vertebral arteries are patent without evidence of hemodynamically significant stenosis. Evaluation is limited at the origins due to artifact. IMPRESSION: MRI: 1. No evidence of acute abnormality. 2. Remote infarcts, detailed above. MRA head: 1. No large vessel occlusion. 2. Possible moderate left PCA stenosis, but motion limits assessment. MRA neck: No visible hemodynamically significant stenosis. Electronically Signed   By: Feliberto Harts M.D.   On: 05/09/2022 18:06   MR ANGIO HEAD WO CONTRAST  Result Date: 05/09/2022 CLINICAL DATA:  Neuro deficit, acute, stroke suspected; cva EXAM: MRI HEAD WITHOUT CONTRAST MRA HEAD WITHOUT CONTRAST MRA NECK WITHOUT AND WITH CONTRAST TECHNIQUE: Multiplanar, multiecho pulse sequences of the brain and surrounding structures were obtained without intravenous contrast. Angiographic images of the  Circle of Willis were obtained using MRA technique without intravenous contrast. Angiographic images of the neck were obtained using MRA technique without and with intravenous contrast. Carotid stenosis measurements (when applicable) are obtained utilizing NASCET criteria, using the distal internal carotid diameter as the denominator. COMPARISON:  MRA and MRI October 16, 2018. FINDINGS: MRI HEAD FINDINGS Brain: Remote left posterior MCA and left PCA territory infarcts. Multiple remote bilateral cerebellar infarct. Additional scattered T2/FLAIR hyperintensities within the white matter, compatible with chronic microvascular ischemic disease. No evidence of acute infarct, acute hemorrhage, midline shift or hydrocephalus. Probable small (1 cm) dural-based meningioma at the vertex along the right falx (series 15, image 25) without significant mass effect. Cerebral atrophy. Vascular: See below. Skull and upper cervical spine: Normal marrow signal. Sinuses/Orbits: Clear sinuses.  No acute orbital findings. Other: No mastoid effusions. MRA HEAD FINDINGS Motion limited study. Anterior circulation: Bilateral intracranial ICAs, MCAs, and ACAs are patent without proximal hemodynamically significant stenosis. Posterior circulation: Bilateral intradural vertebral  arteries are patent. The non dominant left intradural vertebral artery makes a small contribution to the basilar and largely terminates as PICA, anatomic variant. Basilar artery and bilateral posterior cerebral arteries are patent. Possible moderate left PCA stenosis although motion limits assessment. Prominent posterior communicating arteries bilaterally with small vertebrobasilar system, anatomic variant. MRA NECK FINDINGS Aorta: Great vessel origins are patent without high-grade stenosis. Carotid systems: Atherosclerosis at the carotid bifurcations which is likely not hemodynamically significant. Vertebral arteries: Right dominant. Both vertebral arteries are patent  without evidence of hemodynamically significant stenosis. Evaluation is limited at the origins due to artifact. IMPRESSION: MRI: 1. No evidence of acute abnormality. 2. Remote infarcts, detailed above. MRA head: 1. No large vessel occlusion. 2. Possible moderate left PCA stenosis, but motion limits assessment. MRA neck: No visible hemodynamically significant stenosis. Electronically Signed   By: Feliberto Harts M.D.   On: 05/09/2022 18:06   MR ANGIO NECK W WO CONTRAST  Result Date: 05/09/2022 CLINICAL DATA:  Neuro deficit, acute, stroke suspected; cva EXAM: MRI HEAD WITHOUT CONTRAST MRA HEAD WITHOUT CONTRAST MRA NECK WITHOUT AND WITH CONTRAST TECHNIQUE: Multiplanar, multiecho pulse sequences of the brain and surrounding structures were obtained without intravenous contrast. Angiographic images of the Circle of Willis were obtained using MRA technique without intravenous contrast. Angiographic images of the neck were obtained using MRA technique without and with intravenous contrast. Carotid stenosis measurements (when applicable) are obtained utilizing NASCET criteria, using the distal internal carotid diameter as the denominator. COMPARISON:  MRA and MRI October 16, 2018. FINDINGS: MRI HEAD FINDINGS Brain: Remote left posterior MCA and left PCA territory infarcts. Multiple remote bilateral cerebellar infarct. Additional scattered T2/FLAIR hyperintensities within the white matter, compatible with chronic microvascular ischemic disease. No evidence of acute infarct, acute hemorrhage, midline shift or hydrocephalus. Probable small (1 cm) dural-based meningioma at the vertex along the right falx (series 15, image 25) without significant mass effect. Cerebral atrophy. Vascular: See below. Skull and upper cervical spine: Normal marrow signal. Sinuses/Orbits: Clear sinuses.  No acute orbital findings. Other: No mastoid effusions. MRA HEAD FINDINGS Motion limited study. Anterior circulation: Bilateral intracranial  ICAs, MCAs, and ACAs are patent without proximal hemodynamically significant stenosis. Posterior circulation: Bilateral intradural vertebral arteries are patent. The non dominant left intradural vertebral artery makes a small contribution to the basilar and largely terminates as PICA, anatomic variant. Basilar artery and bilateral posterior cerebral arteries are patent. Possible moderate left PCA stenosis although motion limits assessment. Prominent posterior communicating arteries bilaterally with small vertebrobasilar system, anatomic variant. MRA NECK FINDINGS Aorta: Great vessel origins are patent without high-grade stenosis. Carotid systems: Atherosclerosis at the carotid bifurcations which is likely not hemodynamically significant. Vertebral arteries: Right dominant. Both vertebral arteries are patent without evidence of hemodynamically significant stenosis. Evaluation is limited at the origins due to artifact. IMPRESSION: MRI: 1. No evidence of acute abnormality. 2. Remote infarcts, detailed above. MRA head: 1. No large vessel occlusion. 2. Possible moderate left PCA stenosis, but motion limits assessment. MRA neck: No visible hemodynamically significant stenosis. Electronically Signed   By: Feliberto Harts M.D.   On: 05/09/2022 18:06   CT HEAD CODE STROKE WO CONTRAST  Result Date: 05/09/2022 CLINICAL DATA:  Code stroke. EXAM: CT HEAD WITHOUT CONTRAST TECHNIQUE: Contiguous axial images were obtained from the base of the skull through the vertex without intravenous contrast. RADIATION DOSE REDUCTION: This exam was performed according to the departmental dose-optimization program which includes automated exposure control, adjustment of the mA and/or kV according to patient  size and/or use of iterative reconstruction technique. COMPARISON:  04/22/2022 FINDINGS: Brain: No evidence of acute infarction, hemorrhage, mass, mass effect, or midline shift. No hydrocephalus or extra-axial collection. Redemonstrated  remote left parietal and bilateral cerebellar infarcts. Periventricular white matter changes, likely the sequela of chronic small vessel ischemic disease. Vascular: No hyperdense vessel. Skull: Negative for fracture or focal lesion. Sinuses/Orbits: No acute finding. Other: The mastoid air cells are well aerated. ASPECTS [facility] Stroke Program Early CT Score) - Ganglionic level infarction (caudate, lentiform nuclei, internal capsule, insula, M1-M3 cortex): 7 - Supraganglionic infarction (M4-M6 cortex): 3 Total score (0-10 with 10 being normal): 10 IMPRESSION: 1. No acute intracranial process. ASPECTS is 10. 2. Redemonstrated remote left parietal and bilateral cerebellar infarcts. Imaging results were communicated on 05/09/2022 at 1:21 pm to provider STACK via secure text paging. Electronically Signed   By: Wiliam Ke M.D.   On: 05/09/2022 13:21   CT Head Wo Contrast  Result Date: 04/22/2022 CLINICAL DATA:  Head trauma, minor (Age >= 65y) unwitnessed fall; Facial trauma, blunt; Polytrauma, blunt EXAM: CT HEAD WITHOUT CONTRAST CT MAXILLOFACIAL WITHOUT CONTRAST CT CERVICAL SPINE WITHOUT CONTRAST TECHNIQUE: Multidetector CT imaging of the head, cervical spine, and maxillofacial structures were performed using the standard protocol without intravenous contrast. Multiplanar CT image reconstructions of the cervical spine and maxillofacial structures were also generated. RADIATION DOSE REDUCTION: This exam was performed according to the departmental dose-optimization program which includes automated exposure control, adjustment of the mA and/or kV according to patient size and/or use of iterative reconstruction technique. COMPARISON:  None Available. FINDINGS: CT HEAD FINDINGS Brain: No evidence of acute infarction, hemorrhage, hydrocephalus, extra-axial collection or mass lesion/mass effect. Remote left parietal and bilateral cerebellar infarcts. Patchy white matter hypodensities, nonspecific but compatible with  chronic microvascular ischemic disease. Vascular: Calcific atherosclerosis. Skull: No acute fracture. Other: No mastoid effusions. CT MAXILLOFACIAL FINDINGS Osseous: No evidence of acute fracture. Mild deformity of the right lateral orbital wall appears similar to the prior. Orbits: Negative. No traumatic or inflammatory finding. Sinuses: Clear. Soft tissues: Negative. CT CERVICAL SPINE FINDINGS Alignment: Straightening.  No substantial sagittal subluxation. Skull base and vertebrae: Vertebral body heights are maintained. No evidence of acute fracture. Osteopenia. Soft tissues and spinal canal: No prevertebral fluid or swelling. No visible canal hematoma. Disc levels: Multilevel degenerative disease and facet/uncovertebral hypertrophy. Upper chest: Visualized lung apices are clear. IMPRESSION: 1. No evidence of acute intracranial abnormality or facial fracture. 2. No evidence of acute fracture or traumatic malalignment in the cervical spine. Electronically Signed   By: Feliberto Harts M.D.   On: 04/22/2022 12:11   CT Cervical Spine Wo Contrast  Result Date: 04/22/2022 CLINICAL DATA:  Head trauma, minor (Age >= 65y) unwitnessed fall; Facial trauma, blunt; Polytrauma, blunt EXAM: CT HEAD WITHOUT CONTRAST CT MAXILLOFACIAL WITHOUT CONTRAST CT CERVICAL SPINE WITHOUT CONTRAST TECHNIQUE: Multidetector CT imaging of the head, cervical spine, and maxillofacial structures were performed using the standard protocol without intravenous contrast. Multiplanar CT image reconstructions of the cervical spine and maxillofacial structures were also generated. RADIATION DOSE REDUCTION: This exam was performed according to the departmental dose-optimization program which includes automated exposure control, adjustment of the mA and/or kV according to patient size and/or use of iterative reconstruction technique. COMPARISON:  None Available. FINDINGS: CT HEAD FINDINGS Brain: No evidence of acute infarction, hemorrhage,  hydrocephalus, extra-axial collection or mass lesion/mass effect. Remote left parietal and bilateral cerebellar infarcts. Patchy white matter hypodensities, nonspecific but compatible with chronic microvascular ischemic disease. Vascular: Calcific atherosclerosis.  Skull: No acute fracture. Other: No mastoid effusions. CT MAXILLOFACIAL FINDINGS Osseous: No evidence of acute fracture. Mild deformity of the right lateral orbital wall appears similar to the prior. Orbits: Negative. No traumatic or inflammatory finding. Sinuses: Clear. Soft tissues: Negative. CT CERVICAL SPINE FINDINGS Alignment: Straightening.  No substantial sagittal subluxation. Skull base and vertebrae: Vertebral body heights are maintained. No evidence of acute fracture. Osteopenia. Soft tissues and spinal canal: No prevertebral fluid or swelling. No visible canal hematoma. Disc levels: Multilevel degenerative disease and facet/uncovertebral hypertrophy. Upper chest: Visualized lung apices are clear. IMPRESSION: 1. No evidence of acute intracranial abnormality or facial fracture. 2. No evidence of acute fracture or traumatic malalignment in the cervical spine. Electronically Signed   By: Feliberto Harts M.D.   On: 04/22/2022 12:11   CT MAXILLOFACIAL WO CONTRAST  Result Date: 04/22/2022 CLINICAL DATA:  Head trauma, minor (Age >= 65y) unwitnessed fall; Facial trauma, blunt; Polytrauma, blunt EXAM: CT HEAD WITHOUT CONTRAST CT MAXILLOFACIAL WITHOUT CONTRAST CT CERVICAL SPINE WITHOUT CONTRAST TECHNIQUE: Multidetector CT imaging of the head, cervical spine, and maxillofacial structures were performed using the standard protocol without intravenous contrast. Multiplanar CT image reconstructions of the cervical spine and maxillofacial structures were also generated. RADIATION DOSE REDUCTION: This exam was performed according to the departmental dose-optimization program which includes automated exposure control, adjustment of the mA and/or kV according  to patient size and/or use of iterative reconstruction technique. COMPARISON:  None Available. FINDINGS: CT HEAD FINDINGS Brain: No evidence of acute infarction, hemorrhage, hydrocephalus, extra-axial collection or mass lesion/mass effect. Remote left parietal and bilateral cerebellar infarcts. Patchy white matter hypodensities, nonspecific but compatible with chronic microvascular ischemic disease. Vascular: Calcific atherosclerosis. Skull: No acute fracture. Other: No mastoid effusions. CT MAXILLOFACIAL FINDINGS Osseous: No evidence of acute fracture. Mild deformity of the right lateral orbital wall appears similar to the prior. Orbits: Negative. No traumatic or inflammatory finding. Sinuses: Clear. Soft tissues: Negative. CT CERVICAL SPINE FINDINGS Alignment: Straightening.  No substantial sagittal subluxation. Skull base and vertebrae: Vertebral body heights are maintained. No evidence of acute fracture. Osteopenia. Soft tissues and spinal canal: No prevertebral fluid or swelling. No visible canal hematoma. Disc levels: Multilevel degenerative disease and facet/uncovertebral hypertrophy. Upper chest: Visualized lung apices are clear. IMPRESSION: 1. No evidence of acute intracranial abnormality or facial fracture. 2. No evidence of acute fracture or traumatic malalignment in the cervical spine. Electronically Signed   By: Feliberto Harts M.D.   On: 04/22/2022 12:11    Microbiology: Results for orders placed or performed during the hospital encounter of 09/06/19  Blood Culture (routine x 2)     Status: Abnormal   Collection Time: 09/06/19  4:17 PM   Specimen: BLOOD LEFT FOREARM  Result Value Ref Range Status   Specimen Description BLOOD LEFT FOREARM  Final   Special Requests   Final    BOTTLES DRAWN AEROBIC AND ANAEROBIC Blood Culture adequate volume   Culture  Setup Time   Final    GRAM NEGATIVE RODS IN BOTH AEROBIC AND ANAEROBIC BOTTLES Organism ID to follow CRITICAL RESULT CALLED TO, READ BACK  BY AND VERIFIED WITH: Roselie Skinner PharmD 9:10 09/07/19 ([email or web address]) Performed at [facility], [address], [place], [place] [address]    Culture ENTEROBACTER AEROGENES (A)  Final   Report Status 09/09/2019 FINAL  Final   Organism ID, Bacteria ENTEROBACTER AEROGENES  Final      Susceptibility   Enterobacter aerogenes - MIC*    CEFAZOLIN >=64 RESISTANT Resistant  CEFEPIME <=0.12 SENSITIVE Sensitive     CEFTAZIDIME <=1 SENSITIVE Sensitive     CEFTRIAXONE <=0.25 SENSITIVE Sensitive     CIPROFLOXACIN <=0.25 SENSITIVE Sensitive     GENTAMICIN <=1 SENSITIVE Sensitive     IMIPENEM 2 SENSITIVE Sensitive     TRIMETH/SULFA <=20 SENSITIVE Sensitive     PIP/TAZO <=4 SENSITIVE Sensitive     * ENTEROBACTER AEROGENES  Blood Culture ID Panel (Reflexed)     Status: Abnormal   Collection Time: 09/06/19  4:17 PM  Result Value Ref Range Status   Enterococcus faecalis NOT DETECTED NOT DETECTED Final   Enterococcus Faecium NOT DETECTED NOT DETECTED Final   Listeria monocytogenes NOT DETECTED NOT DETECTED Final   Staphylococcus species NOT DETECTED NOT DETECTED Final   Staphylococcus aureus (BCID) NOT DETECTED NOT DETECTED Final   Staphylococcus epidermidis NOT DETECTED NOT DETECTED Final   Staphylococcus lugdunensis NOT DETECTED NOT DETECTED Final   Streptococcus species NOT DETECTED NOT DETECTED Final   Streptococcus agalactiae NOT DETECTED NOT DETECTED Final   Streptococcus pneumoniae NOT DETECTED NOT DETECTED Final   Streptococcus pyogenes NOT DETECTED NOT DETECTED Final   A.calcoaceticus-baumannii NOT DETECTED NOT DETECTED Final   Bacteroides fragilis NOT DETECTED NOT DETECTED Final   Enterobacterales DETECTED (A) NOT DETECTED Final    Comment: Enterobacterales represent a large order of gram negative bacteria, not a single organism. CRITICAL RESULT CALLED TO, READ BACK BY AND VERIFIED WITH: Roselie Skinner PharmD 9:10 09/07/19 ([email or web address])    Enterobacter cloacae complex NOT DETECTED NOT DETECTED  Final   Escherichia coli NOT DETECTED NOT DETECTED Final   Klebsiella aerogenes DETECTED (A) NOT DETECTED Final    Comment: CRITICAL RESULT CALLED TO, READ BACK BY AND VERIFIED WITH: Roselie Skinner PharmD 9:10 09/07/19 ([email or web address])    Klebsiella oxytoca NOT DETECTED NOT DETECTED Final   Klebsiella pneumoniae NOT DETECTED NOT DETECTED Final   Proteus species NOT DETECTED NOT DETECTED Final   Salmonella species NOT DETECTED NOT DETECTED Final   Serratia marcescens NOT DETECTED NOT DETECTED Final   Haemophilus influenzae NOT DETECTED NOT DETECTED Final   Neisseria meningitidis NOT DETECTED NOT DETECTED Final   Pseudomonas aeruginosa NOT DETECTED NOT DETECTED Final   Stenotrophomonas maltophilia NOT DETECTED NOT DETECTED Final   Candida albicans NOT DETECTED NOT DETECTED Final   Candida auris NOT DETECTED NOT DETECTED Final   Candida glabrata NOT DETECTED NOT DETECTED Final   Candida krusei NOT DETECTED NOT DETECTED Final   Candida parapsilosis NOT DETECTED NOT DETECTED Final   Candida tropicalis NOT DETECTED NOT DETECTED Final   Cryptococcus neoformans/gattii NOT DETECTED NOT DETECTED Final   CTX-M ESBL NOT DETECTED NOT DETECTED Final   Carbapenem resistance IMP NOT DETECTED NOT DETECTED Final   Carbapenem resistance KPC NOT DETECTED NOT DETECTED Final   Carbapenem resistance NDM NOT DETECTED NOT DETECTED Final   Carbapenem resist OXA 48 LIKE NOT DETECTED NOT DETECTED Final   Carbapenem resistance VIM NOT DETECTED NOT DETECTED Final    Comment: Performed at [facility], [address], [place], [place] [address]  Urine culture     Status: None   Collection Time: 09/06/19  4:18 PM   Specimen: In/Out Cath Urine  Result Value Ref Range Status   Specimen Description IN/OUT CATH URINE  Final   Special Requests NONE  Final   Culture   Final    NO GROWTH Performed at [facility], [address] [place], [place] [address]    Report Status 09/07/2019 FINAL  Final  SARS Coronavirus 2  by RT PCR (hospital order, performed in [facility] hospital lab) Nasopharyngeal Urine, Catheterized     Status: None   Collection Time: 09/06/19  4:18 PM   Specimen: Urine, Catheterized; Nasopharyngeal  Result Value Ref Range Status   SARS Coronavirus 2 NEGATIVE NEGATIVE Final    Comment: (NOTE) SARS-CoV-2 target nucleic acids are NOT DETECTED.  The SARS-CoV-2 RNA is generally detectable in upper and lower respiratory specimens during the acute phase of infection. The lowest concentration of SARS-CoV-2 viral copies this assay can detect is 250 copies / mL. A negative result does not preclude SARS-CoV-2 infection and should not be used as the sole basis for treatment or other patient management decisions.  A negative result may occur with improper specimen collection / handling, submission of specimen other than nasopharyngeal swab, presence of viral mutation(s) within the areas targeted by this assay, and inadequate number of viral copies (<250 copies / mL). A negative result must be combined with clinical observations, patient history, and epidemiological information.  Fact Sheet for Patients:   BoilerBrush.com.cy  Fact Sheet for Healthcare Providers: https://pope.com/  This test is not yet approved or  cleared by the [place] FDA and has been authorized for detection and/or diagnosis of SARS-CoV-2 by FDA under an Emergency Use Authorization (EUA).  This EUA will remain in effect (meaning this test can be used) for the duration of the COVID-19 declaration under Section 564(b)(1) of the Act, 21 U.S.C. section 360bbb-3(b)(1), unless the authorization is terminated or revoked sooner.  Performed at [facility], [address], [place], [place] [address]   Blood Culture (routine x 2)     Status: Abnormal   Collection Time: 09/06/19  4:21 PM   Specimen: BLOOD  Result Value Ref Range Status   Specimen Description BLOOD  LEFT UPPER ARM  Final   Special Requests   Final    BOTTLES DRAWN AEROBIC AND ANAEROBIC Blood Culture adequate volume   Culture  Setup Time   Final    GRAM NEGATIVE RODS IN BOTH AEROBIC AND ANAEROBIC BOTTLES CRITICAL VALUE NOTED.  VALUE IS CONSISTENT WITH PREVIOUSLY REPORTED AND CALLED VALUE.    Culture (A)  Final    ENTEROBACTER AEROGENES SUSCEPTIBILITIES PERFORMED ON PREVIOUS CULTURE WITHIN THE LAST 5 DAYS. Performed at [facility], [address], [place], [place] [address]    Report Status 09/09/2019 FINAL  Final  Culture, blood (routine x 2)     Status: None   Collection Time: 09/09/19  7:40 AM   Specimen: BLOOD  Result Value Ref Range Status   Specimen Description BLOOD BLOOD LEFT HAND  Final   Special Requests   Final    BOTTLES DRAWN AEROBIC AND ANAEROBIC Blood Culture adequate volume   Culture   Final    NO GROWTH 5 DAYS Performed at [facility], [address], [place], [place] [address]    Report Status 09/14/2019 FINAL  Final  Culture, blood (routine x 2)     Status: None   Collection Time: 09/09/19  7:40 AM   Specimen: BLOOD  Result Value Ref Range Status   Specimen Description BLOOD LEFT ANTECUBITAL  Final   Special Requests   Final    BOTTLES DRAWN AEROBIC AND ANAEROBIC Blood Culture adequate volume   Culture   Final    NO GROWTH 5 DAYS Performed at [facility], [address], [place], [place] [address]    Report  Status 09/14/2019 FINAL  Final  SARS Coronavirus 2 by RT PCR (hospital order, performed in [facility] hospital lab) Nasopharyngeal Nasopharyngeal Swab     Status: None   Collection Time: 09/12/19  1:15 PM   Specimen: Nasopharyngeal Swab  Result Value Ref Range Status   SARS Coronavirus 2 NEGATIVE NEGATIVE Final    Comment: (NOTE) SARS-CoV-2 target nucleic acids are NOT DETECTED.  The SARS-CoV-2 RNA is generally detectable in upper and lower respiratory specimens during the acute phase of infection. The lowest concentration  of SARS-CoV-2 viral copies this assay can detect is 250 copies / mL. A negative result does not preclude SARS-CoV-2 infection and should not be used as the sole basis for treatment or other patient management decisions.  A negative result may occur with improper specimen collection / handling, submission of specimen other than nasopharyngeal swab, presence of viral mutation(s) within the areas targeted by this assay, and inadequate number of viral copies (<250 copies / mL). A negative result must be combined with clinical observations, patient history, and epidemiological information.  Fact Sheet for Patients:   BoilerBrush.com.cy  Fact Sheet for Healthcare Providers: https://pope.com/  This test is not yet approved or  cleared by the [place] FDA and has been authorized for detection and/or diagnosis of SARS-CoV-2 by FDA under an Emergency Use Authorization (EUA).  This EUA will remain in effect (meaning this test can be used) for the duration of the COVID-19 declaration under Section 564(b)(1) of the Act, 21 U.S.C. section 360bbb-3(b)(1), unless the authorization is terminated or revoked sooner.  Performed at [facility], [address] [place], [place] [address]     Labs: CBC: Recent Labs  Lab 05/09/22 1304  WBC 8.6  NEUTROABS 5.4  HGB 11.6*  HCT 35.7*  MCV 93.5  PLT 287   Basic Metabolic Panel: Recent Labs  Lab 05/09/22 1304 05/10/22 0509  NA 139 139  K 3.1* 3.7  CL 104 106  CO2 27 27  GLUCOSE 68* 120*  BUN 18 14  CREATININE 0.75 0.59  CALCIUM 8.3* 8.6*  MG  --  2.1   Liver Function Tests: Recent Labs  Lab 05/09/22 1304  AST 18  ALT 11  ALKPHOS 101  BILITOT 0.5  PROT 6.4*  ALBUMIN 3.5   CBG: Recent Labs  Lab 05/10/22 0744 05/10/22 1153 05/10/22 1643 05/10/22 2348 05/11/22 0802  GLUCAP 133* 156* 131* 176* 142*    Discharge time spent: greater than 30  minutes.  Signed: Lurene Shadow, MD [facility] 05/11/2022

## 2022-05-11 NOTE — Progress Notes (Signed)
Telephone call from CCMD advising that the pt's Telemetry HR down in the 40's and back into the 50"s

## 2022-05-11 NOTE — Evaluation (Signed)
Speech Language Pathology Evaluation Patient Details Name: Samantha Aguirre MRN: [id number] DOB: Jun 10, 1945 Today's Date: 05/11/2022 Time: 4098-1191 SLP Time Calculation (min) (ACUTE ONLY): 15 min  Problem List:  Patient Active Problem List   Diagnosis Date Noted   Tobacco abuse counseling 05/09/2022   Fall at nursing home    Vascular dementia without behavioral disturbance (HCC)    Bacteremia    Sepsis with encephalopathy without septic shock (HCC)    AMS (altered mental status) 09/06/2019   Fever 09/06/2019   SIRS (systemic inflammatory response syndrome) (HCC) 09/06/2019   Hypokalemia 09/06/2019   Status post reverse total shoulder replacement, 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   Stroke (HCC) 06/13/2018   Hypotension 03/18/2018   Malnutrition of moderate degree 03/16/2018   Closed fracture of lumbar vertebral body (HCC) 03/15/2018   Speaking difficulty 03/13/2018   TIA (transient ischemic attack) 09/22/2017   PAF (paroxysmal atrial fibrillation) (HCC)    Acute CVA (cerebrovascular accident) (HCC) 07/30/2017   GI bleed 07/08/2017   Anemia 07/08/2017   Angiodysplasia of stomach and duodenum with hemorrhage    CVA (cerebral vascular accident) (HCC) 07/06/2017   Stroke (cerebrum) (HCC)    Goals of care, counseling/discussion    Palliative care encounter    COPD exacerbation (HCC) 04/03/2017   Flu 03/27/2017   Iron deficiency anemia due to chronic blood loss    Migraine 02/13/2017   Cataracts, bilateral 01/15/2017   Chronic venous insufficiency 10/06/2016   Recurrent major depressive disorder, in partial remission (HCC) 10/06/2016   Left-sided weakness 10/03/2016   Chest pain 02/27/2016   History of stroke 02/19/2016   Thumb pain, left 01/22/2016   Traumatic closed nondisplaced fracture of base of metacarpal bone of left thumb 01/22/2016   Colon cancer screening 11/27/2015   Depression 11/14/2015   Coronary artery disease 11/13/2015   Hypertension 11/13/2015   History of skin cancer  11/13/2015   Osteoporosis 11/13/2015   Chronic low back pain 11/13/2015   Left medial knee pain 11/13/2015   Osteoarthritis of multiple joints 11/13/2015   Other specified hypothyroidism 08/16/2015   Type 2 diabetes mellitus with other specified complication (HCC) 08/16/2015   COPD (chronic obstructive pulmonary disease) (HCC) 08/16/2015   Tobacco abuse 08/16/2015   HLD (hyperlipidemia) 08/16/2015   History of CHF (congestive heart failure) 08/16/2015   Seizure (HCC)    Past Medical History:  Past Medical History:  Diagnosis Date   Acid reflux    Arthritis    Bilateral knees, hands, feet   Asthma    Atrial fibrillation (HCC)    Cancer (HCC)    CHF (congestive heart failure) (HCC)    Diastolic CHF   COPD (chronic obstructive pulmonary disease) (HCC)    Coronary artery disease    Diabetes mellitus without complication (HCC)    Frequent headaches    GI bleed    HLD (hyperlipidemia)    Hypertension    Seizures (HCC)    Skin cancer 2015   Suspected basal cell carcinoma on nose, surgically removed   Stroke (HCC) 04/2017   Thyroid disease    Tremors of nervous system    Past Surgical History:  Past Surgical History:  Procedure Laterality Date   ABDOMINAL HYSTERECTOMY  1976   APPENDECTOMY  1980   BACK SURGERY     cardiac stents     CAROTID ENDARTERECTOMY     CHOLECYSTECTOMY  1980   COLONOSCOPY N/A 02/20/2017   Procedure: COLONOSCOPY;  Surgeon: Toney Reil, MD;  Location: [facility] ENDOSCOPY;  Service: Gastroenterology;  Laterality: N/A;   ESOPHAGOGASTRODUODENOSCOPY N/A 02/20/2017   Procedure: ESOPHAGOGASTRODUODENOSCOPY (EGD);  Surgeon: Toney Reil, MD;  Location: [facility] ENDOSCOPY;  Service: Gastroenterology;  Laterality: N/A;   ESOPHAGOGASTRODUODENOSCOPY (EGD) WITH PROPOFOL N/A 07/08/2017   Procedure: ESOPHAGOGASTRODUODENOSCOPY (EGD) WITH PROPOFOL;  Surgeon: Midge Minium, MD;  Location: [facility] ENDOSCOPY;  Service: Endoscopy;  Laterality: N/A;   GALLBLADDER SURGERY  1980    GIVENS CAPSULE STUDY N/A 07/09/2017   Procedure: GIVENS CAPSULE STUDY;  Surgeon: Wyline Mood, MD;  Location: [facility] ENDOSCOPY;  Service: Gastroenterology;  Laterality: N/A;   KNEE SURGERY     left   KYPHOPLASTY N/A 03/16/2018   Procedure: KYPHOPLASTY, L5;  Surgeon: Kennedy Bucker, MD;  Location: [facility] ORS;  Service: Orthopedics;  Laterality: N/A;   REVERSE SHOULDER ARTHROPLASTY Right 08/25/2019   Procedure: REVERSE SHOULDER ARTHROPLASTY;  Surgeon: Christena Flake, MD;  Location: [facility] ORS;  Service: Orthopedics;  Laterality: Right;   ROTATOR CUFF REPAIR     right   TONSILLECTOMY  1950   HPI:  Per H&P "Samantha Aguirre is a 77 y.o. female with medical history significant of asthma, atrial fibrillation, CHF, COPD, coronary artery disease, type 2 diabetes history of CVA with left-sided weakness, seizures presenting with CVA.  Patient reports developing sudden onset of left-sided weakness and left-sided numbness/facial droop.  Episode happened around 1030 this morning.  Minimal confusion.  No chest pain or shortness of breath.  No nausea or vomiting.  Noted remote history of CVA with left-sided weakness approximately 4 to 5 years ago.  Has had intermittent left-sided weakness since then.  Still smoking up to 1/4 to 1/2 pack/day.  No reported alcohol use.  Denies any loss of consciousness no seizure-like activity.  No abdominal pain.  Has been compliant with aspirin and Plavix daily.  Presented to the ER afebrile, hemodynamically stable.  Code stroke initially called.  CT head grossly stable.  White count 8.6, hemoglobin 11.6, platelets 287, creatinine 0.75, potassium 3.1.  Dr. Selina Cooley with neurology evaluated patient with recommendation for inpatient admission and further evaluation.  Patient not tPA candidate in setting of history of GI bleeding in the past."   Assessment / Plan / Recommendation Clinical Impression  Pt seen for speech/language evaluation. Noted pt with hx of vascular dementia and aphasia.  ?premorbid functional status. No family present to provide collateral. Assessment completed via infromal means and portions of [employer] Aphasia Battery Revised Bedside Record Form. Pt presents with s/sx significant wordfinding difficulty during spontaneous speech, confrontation naming, and picture description tasks. Pt with mildly impaired  auditory comprehension with yes/no questoins and 2-step commands. Pt with good awareness of errors, but inconsistent ability to repair communication breakdowns. Intact repetition noted. Pt d/c'ing to ALF this date. Recommend post-acute SLP services at ALF to ensure pt is at speech/language baseline. SLP to sign off at this level of care.    SLP Assessment  SLP Recommendation/Assessment: All further Speech Lanaguage Pathology  needs can be addressed in the next venue of care SLP Visit Diagnosis: Aphasia (R47.01)    Recommendations for follow up therapy are one component of a multi-disciplinary discharge planning process, led by the attending physician.  Recommendations may be updated based on patient status, additional functional criteria and insurance authorization.    Follow Up Recommendations  Home health SLP    Assistance Recommended at Discharge  Intermittent Supervision/Assistance  Functional Status Assessment Patient has had a recent decline in their functional status and demonstrates the ability to make significant improvements in function in a reasonable  and predictable amount of time.        SLP Evaluation Cognition  Overall Cognitive Status: Within Functional Limits for tasks assessed Orientation Level: Oriented X4       Comprehension  Auditory Comprehension Overall Auditory Comprehension: Impaired Yes/No Questions: Impaired Basic Immediate Environment Questions: 75-100% accurate Complex Questions: 75-100% accurate Commands: Impaired Two Step Basic Commands: 75-100% accurate Complex Commands: 50-74% accurate    Expression  Expression Primary Mode of Expression: Verbal Verbal Expression Overall Verbal Expression: Impaired Initiation: No impairment Automatic Speech: Name;Social Response (WFL) Level of Generative/Spontaneous Verbalization: Sentence;Conversation Repetition: No impairment Naming: Impairment Confrontation: Impaired Verbal Errors: Semantic paraphasias;Perseveration;Aware of errors Pragmatics: Impairment Impairments: Topic maintenance (due to aphasia) Interfering Components: Premorbid deficit Effective Techniques: Sentence completion;Phonemic cues;Articulatory cues;Semantic cues Written Expression Dominant Hand: Right Written Expression:  (DNT)   Oral / Motor  Oral Motor/Sensory Function Overall Oral Motor/Sensory Function: Generalized oral weakness (edentulism; ?slight R facial droop) Motor Speech Respiration: Within functional limits Phonation: Normal Resonance: Within functional limits Articulation: Within functional limitis Intelligibility: Intelligible Motor Planning: Witnin functional limits           Clyde Canterbury, M.S., CCC-SLP Speech-Language Pathologist [facility] [phone] [name])  Woodroe Chen 05/11/2022, 11:46 AM

## 2022-05-11 NOTE — Progress Notes (Signed)
MD order received in [facility] to discharge pt back to [facility]; [facility] previously established discharge with facility (to pick pt up between 12:30-1:00p today); report called to Tammy at [facility] [phone], no questions voiced at this time; pt's discharge pending arrival of facility transportation at the [facility] entrance

## 2022-05-11 NOTE — TOC Transition Note (Signed)
Transition of Care [facility]) - CM/SW Discharge Note   Patient Details  Name: Samantha Aguirre MRN: [id number] Date of Birth: [birth year]  Transition of Care [facility]) CM/SW Contact:  Verna Czech [place], [place] Phone Number: 05/11/2022, 10:49 AM   Clinical Narrative:     Phone call from [facility] [place] who states that Larina Earthly will pick up patient to transport back to the Assisted Living between 12:30pm and 1pm. Discharge summary faxed to [phone]. RN to call report to Tammie at [phone].   Faustine Tates, LCSW Clinical Social Worker  [facility] Care Management [phone]         Patient Goals and CMS Choice      Discharge Placement                         Discharge Plan and Services Additional resources added to the After Visit Summary for                                       Social Determinants of Health (SDOH) Interventions SDOH Screenings   Food Insecurity: No Food Insecurity (05/09/2022)  Housing: Low Risk  (05/09/2022)  Transportation Needs: No Transportation Needs (05/09/2022)  Utilities: Not At Risk (05/09/2022)  Depression (PHQ2-9): Medium Risk (04/21/2018)  Financial Resource Strain: Medium Risk (04/02/2017)  Physical Activity: Insufficiently Active (05/19/2017)  Social Connections: Moderately Isolated (05/19/2017)  Stress: Stress Concern Present (05/19/2017)  Tobacco Use: Medium Risk (05/10/2022)     Readmission Risk Interventions     No data to display

## 2022-10-21 ENCOUNTER — Emergency Department: Payer: Medicare Other

## 2022-10-21 ENCOUNTER — Observation Stay
Admission: EM | Admit: 2022-10-21 | Discharge: 2022-10-22 | Disposition: A | Discharge status: Home / Self Care | Payer: Medicare Other | Attending: Student in an Organized Health Care Education/Training Program | Admitting: Student in an Organized Health Care Education/Training Program

## 2022-10-21 ENCOUNTER — Other Ambulatory Visit: Payer: Self-pay

## 2022-10-21 DIAGNOSIS — I2510 Atherosclerotic heart disease of native coronary artery without angina pectoris: Secondary | ICD-10-CM | POA: Insufficient documentation

## 2022-10-21 DIAGNOSIS — F3341 Major depressive disorder, recurrent, in partial remission: Secondary | ICD-10-CM | POA: Diagnosis present

## 2022-10-21 DIAGNOSIS — J45909 Unspecified asthma, uncomplicated: Secondary | ICD-10-CM | POA: Insufficient documentation

## 2022-10-21 DIAGNOSIS — G629 Polyneuropathy, unspecified: Secondary | ICD-10-CM

## 2022-10-21 DIAGNOSIS — G459 Transient cerebral ischemic attack, unspecified: Principal | ICD-10-CM | POA: Diagnosis present

## 2022-10-21 DIAGNOSIS — Z96611 Presence of right artificial shoulder joint: Secondary | ICD-10-CM | POA: Insufficient documentation

## 2022-10-21 DIAGNOSIS — Z79899 Other long term (current) drug therapy: Secondary | ICD-10-CM | POA: Insufficient documentation

## 2022-10-21 DIAGNOSIS — E038 Other specified hypothyroidism: Secondary | ICD-10-CM | POA: Diagnosis present

## 2022-10-21 DIAGNOSIS — I10 Essential (primary) hypertension: Secondary | ICD-10-CM | POA: Diagnosis present

## 2022-10-21 DIAGNOSIS — F0150 Vascular dementia without behavioral disturbance: Secondary | ICD-10-CM | POA: Diagnosis present

## 2022-10-21 DIAGNOSIS — Z8673 Personal history of transient ischemic attack (TIA), and cerebral infarction without residual deficits: Secondary | ICD-10-CM | POA: Insufficient documentation

## 2022-10-21 DIAGNOSIS — J449 Chronic obstructive pulmonary disease, unspecified: Secondary | ICD-10-CM | POA: Diagnosis not present

## 2022-10-21 DIAGNOSIS — Z85828 Personal history of other malignant neoplasm of skin: Secondary | ICD-10-CM | POA: Insufficient documentation

## 2022-10-21 DIAGNOSIS — G40909 Epilepsy, unspecified, not intractable, without status epilepticus: Secondary | ICD-10-CM | POA: Diagnosis not present

## 2022-10-21 DIAGNOSIS — I480 Paroxysmal atrial fibrillation: Secondary | ICD-10-CM | POA: Diagnosis not present

## 2022-10-21 DIAGNOSIS — R531 Weakness: Secondary | ICD-10-CM | POA: Diagnosis present

## 2022-10-21 DIAGNOSIS — I5032 Chronic diastolic (congestive) heart failure: Secondary | ICD-10-CM | POA: Diagnosis not present

## 2022-10-21 DIAGNOSIS — R569 Unspecified convulsions: Secondary | ICD-10-CM

## 2022-10-21 DIAGNOSIS — R2990 Unspecified symptoms and signs involving the nervous system: Principal | ICD-10-CM

## 2022-10-21 DIAGNOSIS — Z7902 Long term (current) use of antithrombotics/antiplatelets: Secondary | ICD-10-CM | POA: Diagnosis not present

## 2022-10-21 DIAGNOSIS — E1169 Type 2 diabetes mellitus with other specified complication: Secondary | ICD-10-CM | POA: Diagnosis present

## 2022-10-21 DIAGNOSIS — I110 Hypertensive heart disease with heart failure: Secondary | ICD-10-CM | POA: Insufficient documentation

## 2022-10-21 LAB — CBC
HCT: 36.9 % (ref 36.0–46.0)
Hemoglobin: 11.9 g/dL — ABNORMAL LOW (ref 12.0–15.0)
MCH: 29.8 pg (ref 26.0–34.0)
MCHC: 32.2 g/dL (ref 30.0–36.0)
MCV: 92.3 fL (ref 80.0–100.0)
Platelets: 223 10*3/uL (ref 150–400)
RBC: 4 MIL/uL (ref 3.87–5.11)
RDW: 13.7 % (ref 11.5–15.5)
WBC: 6.9 10*3/uL (ref 4.0–10.5)
nRBC: 0 % (ref 0.0–0.2)

## 2022-10-21 LAB — GLUCOSE, CAPILLARY
Glucose-Capillary: 160 mg/dL — ABNORMAL HIGH (ref 70–99)
Glucose-Capillary: 179 mg/dL — ABNORMAL HIGH (ref 70–99)
Glucose-Capillary: 127 mg/dL — ABNORMAL HIGH (ref 70–99)

## 2022-10-21 LAB — URINE DRUG SCREEN, QUALITATIVE (ARMC ONLY)
Amphetamines, Ur Screen: NOT DETECTED
Barbiturates, Ur Screen: NOT DETECTED
Benzodiazepine, Ur Scrn: NOT DETECTED
Cannabinoid 50 Ng, Ur ~~LOC~~: NOT DETECTED
Cocaine Metabolite,Ur ~~LOC~~: NOT DETECTED
MDMA (Ecstasy)Ur Screen: NOT DETECTED
Methadone Scn, Ur: NOT DETECTED
Opiate, Ur Screen: NOT DETECTED
Phencyclidine (PCP) Ur S: NOT DETECTED
Tricyclic, Ur Screen: NOT DETECTED

## 2022-10-21 LAB — DIFFERENTIAL
Abs Immature Granulocytes: 0.02 10*3/uL (ref 0.00–0.07)
Basophils Absolute: 0 10*3/uL (ref 0.0–0.1)
Basophils Relative: 1 %
Eosinophils Absolute: 0.1 10*3/uL (ref 0.0–0.5)
Eosinophils Relative: 2 %
Immature Granulocytes: 0 %
Lymphocytes Relative: 26 %
Lymphs Abs: 1.8 10*3/uL (ref 0.7–4.0)
Monocytes Absolute: 0.5 10*3/uL (ref 0.1–1.0)
Monocytes Relative: 8 %
Neutro Abs: 4.4 10*3/uL (ref 1.7–7.7)
Neutrophils Relative %: 63 %

## 2022-10-21 LAB — URINALYSIS, ROUTINE W REFLEX MICROSCOPIC
Bilirubin Urine: NEGATIVE
Glucose, UA: NEGATIVE mg/dL
Hgb urine dipstick: NEGATIVE
Ketones, ur: NEGATIVE mg/dL
Leukocytes,Ua: NEGATIVE
Nitrite: NEGATIVE
Protein, ur: NEGATIVE mg/dL
Specific Gravity, Urine: 1.009 (ref 1.005–1.030)
pH: 5 (ref 5.0–8.0)

## 2022-10-21 LAB — COMPREHENSIVE METABOLIC PANEL
ALT: 9 U/L (ref 0–44)
AST: 13 U/L — ABNORMAL LOW (ref 15–41)
Albumin: 3.6 g/dL (ref 3.5–5.0)
Alkaline Phosphatase: 101 U/L (ref 38–126)
Anion gap: 9 (ref 5–15)
BUN: 18 mg/dL (ref 8–23)
CO2: 25 mmol/L (ref 22–32)
Calcium: 8.6 mg/dL — ABNORMAL LOW (ref 8.9–10.3)
Chloride: 105 mmol/L (ref 98–111)
Creatinine, Ser: 0.76 mg/dL (ref 0.44–1.00)
GFR, Estimated: >60 mL/min (ref >60)
Glucose, Bld: 130 mg/dL — ABNORMAL HIGH (ref 70–99)
Potassium: 3.6 mmol/L (ref 3.5–5.1)
Sodium: 139 mmol/L (ref 135–145)
Total Bilirubin: 0.6 mg/dL (ref 0.3–1.2)
Total Protein: 6.6 g/dL (ref 6.5–8.1)

## 2022-10-21 LAB — APTT: aPTT: 27 s (ref 24–36)

## 2022-10-21 LAB — ETHANOL: Alcohol, Ethyl (B): <10 mg/dL (ref <10)

## 2022-10-21 LAB — PROTIME-INR
INR: 1 (ref 0.8–1.2)
Prothrombin Time: 12.8 s (ref 11.4–15.2)

## 2022-10-21 LAB — TSH: TSH: 1.443 u[IU]/mL (ref 0.350–4.500)

## 2022-10-21 MED ORDER — DULOXETINE HCL 60 MG PO CPEP
60.0000 mg | ORAL_CAPSULE | Freq: Every day | ORAL | Status: DC
  Administered 2022-10-22: 60 mg via ORAL
  Filled 2022-10-21: qty 1
  Filled 2022-10-21: qty 2

## 2022-10-21 MED ORDER — INSULIN ASPART 100 UNIT/ML IJ SOLN
0.0000 [IU] | Freq: Three times a day (TID) | INTRAMUSCULAR | Status: DC
  Administered 2022-10-21: 3 [IU] via SUBCUTANEOUS
  Administered 2022-10-22: 2 [IU] via SUBCUTANEOUS
  Filled 2022-10-21 (×2): qty 1

## 2022-10-21 MED ORDER — MELATONIN 5 MG PO TABS
10.0000 mg | ORAL_TABLET | Freq: Every day | ORAL | Status: DC
  Administered 2022-10-21: 10 mg via ORAL
  Filled 2022-10-21: qty 2

## 2022-10-21 MED ORDER — PANTOPRAZOLE SODIUM 40 MG PO TBEC
40.0000 mg | DELAYED_RELEASE_TABLET | Freq: Every day | ORAL | Status: DC
  Administered 2022-10-22: 40 mg via ORAL
  Filled 2022-10-21: qty 1

## 2022-10-21 MED ORDER — LEVOTHYROXINE SODIUM 50 MCG PO TABS
125.0000 ug | ORAL_TABLET | Freq: Every day | ORAL | Status: DC
  Administered 2022-10-22: 125 ug via ORAL
  Filled 2022-10-21: qty 3

## 2022-10-21 MED ORDER — VITAMIN D 25 MCG (1000 UNIT) PO TABS
2000.0000 [IU] | ORAL_TABLET | Freq: Every day | ORAL | Status: DC
  Administered 2022-10-21 – 2022-10-22 (×2): 2000 [IU] via ORAL
  Filled 2022-10-21 (×2): qty 2

## 2022-10-21 MED ORDER — ATORVASTATIN CALCIUM 20 MG PO TABS
10.0000 mg | ORAL_TABLET | Freq: Every day | ORAL | Status: DC
  Administered 2022-10-21 – 2022-10-22 (×2): 10 mg via ORAL
  Filled 2022-10-21 (×2): qty 1

## 2022-10-21 MED ORDER — POTASSIUM CHLORIDE CRYS ER 20 MEQ PO TBCR
40.0000 meq | EXTENDED_RELEASE_TABLET | Freq: Once | ORAL | Status: AC
  Administered 2022-10-21: 40 meq via ORAL
  Filled 2022-10-21: qty 2

## 2022-10-21 MED ORDER — ALBUTEROL SULFATE (2.5 MG/3ML) 0.083% IN NEBU: 2.5000 mg | INHALATION_SOLUTION | Freq: Four times a day (QID) | RESPIRATORY_TRACT | Status: DC | PRN

## 2022-10-21 MED ORDER — FUROSEMIDE 40 MG PO TABS
20.0000 mg | ORAL_TABLET | Freq: Every day | ORAL | Status: DC
  Administered 2022-10-22: 20 mg via ORAL
  Filled 2022-10-21: qty 1

## 2022-10-21 MED ORDER — STROKE: EARLY STAGES OF RECOVERY BOOK: Freq: Once | Status: AC

## 2022-10-21 MED ORDER — GABAPENTIN 300 MG PO CAPS
300.0000 mg | ORAL_CAPSULE | Freq: Three times a day (TID) | ORAL | Status: DC
  Administered 2022-10-21 – 2022-10-22 (×3): 300 mg via ORAL
  Filled 2022-10-21 (×3): qty 1

## 2022-10-21 MED ORDER — GADOBUTROL 1 MMOL/ML IV SOLN
9.0000 mL | Freq: Once | INTRAVENOUS | Status: AC | PRN
  Administered 2022-10-21: 9 mL via INTRAVENOUS

## 2022-10-21 MED ORDER — SENNOSIDES-DOCUSATE SODIUM 8.6-50 MG PO TABS: 1.0000 | ORAL_TABLET | Freq: Every evening | ORAL | Status: DC | PRN

## 2022-10-21 MED ORDER — FOLIC ACID 1 MG PO TABS
1.0000 mg | ORAL_TABLET | Freq: Every day | ORAL | Status: DC
  Administered 2022-10-22: 1 mg via ORAL
  Filled 2022-10-21: qty 1

## 2022-10-21 MED ORDER — SODIUM CHLORIDE 0.9 % IV BOLUS
500.0000 mL | Freq: Once | INTRAVENOUS | Status: AC
  Administered 2022-10-21: 500 mL via INTRAVENOUS

## 2022-10-21 MED ORDER — FERROUS SULFATE 325 (65 FE) MG PO TABS
325.0000 mg | ORAL_TABLET | Freq: Every day | ORAL | Status: DC
  Administered 2022-10-22: 325 mg via ORAL
  Filled 2022-10-21: qty 1

## 2022-10-21 MED ORDER — LEVETIRACETAM IN NACL 1000 MG/100ML IV SOLN
1000.0000 mg | Freq: Once | INTRAVENOUS | Status: AC
  Administered 2022-10-21: 1000 mg via INTRAVENOUS
  Filled 2022-10-21: qty 100

## 2022-10-21 MED ORDER — CLOPIDOGREL BISULFATE 75 MG PO TABS
75.0000 mg | ORAL_TABLET | Freq: Every day | ORAL | Status: DC
  Administered 2022-10-22: 75 mg via ORAL
  Filled 2022-10-21: qty 1

## 2022-10-21 MED ORDER — ACETAMINOPHEN 500 MG PO TABS
500.0000 mg | ORAL_TABLET | Freq: Three times a day (TID) | ORAL | Status: DC
  Administered 2022-10-21 – 2022-10-22 (×2): 500 mg via ORAL
  Filled 2022-10-21 (×2): qty 1

## 2022-10-21 MED ORDER — LISINOPRIL 10 MG PO TABS
10.0000 mg | ORAL_TABLET | Freq: Every day | ORAL | Status: DC
  Administered 2022-10-22: 10 mg via ORAL
  Filled 2022-10-21: qty 1

## 2022-10-21 NOTE — ED Provider Notes (Signed)
[facility] Provider Note    Event Date/Time   First MD Initiated Contact with Patient 10/21/22 1037     (approximate)   History   Code Stroke   HPI  Samantha Aguirre is a 77 y.o. female here with acute onset of left-sided weakness and left facial droop.  Patient woke up around 630 and was reportedly at baseline.  She comes from [facility] assisted living.  At around 930, she began to develop left-sided weakness and left facial droop.  Per EMS, facial droop slightly improved en route but then returned.  Patient is on Plavix for history of CVA.  However, she states that she does not have any deficits at baseline.  No recent fevers or chills.  No recent falls.  Nursing trauma.     Physical Exam   Triage Vital Signs: ED Triage Vitals  Encounter Vitals Group     BP      Systolic BP Percentile      Diastolic BP Percentile      Pulse      Resp      Temp      Temp src      SpO2      Weight      Height      Head Circumference      Peak Flow      Pain Score      Pain Loc      Pain Education      Exclude from Growth Chart     Most recent vital signs: Vitals:   10/21/22 1600 10/21/22 1726  BP: (!) 117/54 (!) 129/54  Pulse: (!) 57 (!) 51  Resp: 19 16  Temp:  (!) 97.5 F (36.4 C)  SpO2: 100% 100%     General: Awake, no distress.  CV:  Good peripheral perfusion.  Resp:  Normal work of breathing.  Lungs clear to auscultation bilaterally. Abd:  No distention.  No tenderness. Other:  Intermittent slurred speech, subjective weakness left upper and lower extremity.  Face grossly symmetric.   ED Results / Procedures / Treatments   Labs (all labs ordered are listed, but only abnormal results are displayed) Labs Reviewed  CBC - Abnormal; Notable for the following components:      Result Value   Hemoglobin 11.9 (*)    All other components within normal limits  COMPREHENSIVE METABOLIC PANEL - Abnormal; Notable for the following components:    Glucose, Bld 130 (*)    Calcium 8.6 (*)    AST 13 (*)    All other components within normal limits  URINALYSIS, ROUTINE W REFLEX MICROSCOPIC - Abnormal; Notable for the following components:   Color, Urine STRAW (*)    APPearance CLEAR (*)    All other components within normal limits  GLUCOSE, CAPILLARY - Abnormal; Notable for the following components:   Glucose-Capillary 160 (*)    All other components within normal limits  GLUCOSE, CAPILLARY - Abnormal; Notable for the following components:   Glucose-Capillary 179 (*)    All other components within normal limits  ETHANOL  PROTIME-INR  APTT  DIFFERENTIAL  URINE DRUG SCREEN, QUALITATIVE ([facility] ONLY)  TSH  LIPID PANEL     EKG Normal sinus rhythm, Triklo rate 51.  PR 134, QRS 89, QTc 410.  No acute ST elevation or depression or acute evidence of acute ischemia or infarct.   RADIOLOGY MR brain: No acute abnormality CT head: No acute intracranial malady   I also  independently reviewed and agree with radiologist interpretations.   PROCEDURES:  Critical Care performed: No  .1-3 Lead EKG Interpretation  Performed by: Shaune Pollack, MD Authorized by: Shaune Pollack, MD     Interpretation: non-specific     ECG rate:  60-80   ECG rate assessment: bradycardic     Rhythm: sinus bradycardia     Ectopy: none     Conduction: normal   Comments:     Indication: Stroke   MEDICATIONS ORDERED IN ED: Medications  acetaminophen (TYLENOL) tablet 500 mg (has no administration in time range)  atorvastatin (LIPITOR) tablet 10 mg (10 mg Oral Given 10/21/22 1811)  furosemide (LASIX) tablet 20 mg (20 mg Oral Not Given 10/21/22 1723)  lisinopril (ZESTRIL) tablet 10 mg (10 mg Oral Not Given 10/21/22 1723)  DULoxetine (CYMBALTA) DR capsule 60 mg (60 mg Oral Not Given 10/21/22 1722)  levothyroxine (SYNTHROID) tablet 125 mcg (has no administration in time range)  pantoprazole (PROTONIX) EC tablet 40 mg (40 mg Oral Not Given 10/21/22 1724)   clopidogrel (PLAVIX) tablet 75 mg (75 mg Oral Not Given 10/21/22 1721)  ferrous sulfate tablet 325 mg (325 mg Oral Not Given 10/21/22 1722)  folic acid (FOLVITE) tablet 1 mg (1 mg Oral Not Given 10/21/22 1722)  melatonin tablet 10 mg (has no administration in time range)  gabapentin (NEURONTIN) capsule 300 mg (300 mg Oral Given 10/21/22 1813)  cholecalciferol (VITAMIN D3) 25 MCG (1000 UNIT) tablet 2,000 Units (2,000 Units Oral Given 10/21/22 1813)  albuterol (PROVENTIL) (2.5 MG/3ML) 0.083% nebulizer solution 2.5 mg (has no administration in time range)   stroke: early stages of recovery book (has no administration in time range)  senna-docusate (Senokot-S) tablet 1 tablet (has no administration in time range)  insulin aspart (novoLOG) injection 0-15 Units (3 Units Subcutaneous Given 10/21/22 1748)  gadobutrol (GADAVIST) 1 MMOL/ML injection 9 mL (9 mLs Intravenous Contrast Given 10/21/22 1149)  levETIRAcetam (KEPPRA) IVPB 1000 mg/100 mL premix (0 mg Intravenous Stopped 10/21/22 1609)  sodium chloride 0.9 % bolus 500 mL (0 mLs Intravenous Stopped 10/21/22 1609)     IMPRESSION / MDM / ASSESSMENT AND PLAN / ED COURSE  I reviewed the triage vital signs and the nursing notes.                              Differential diagnosis includes, but is not limited to, CVA, TIA, partial seizure, cervical radiculopathy, encephalopathy, recrudescence of old CVA  Patient's presentation is most consistent with acute presentation with potential threat to life or bodily function.  The patient is on the cardiac monitor to evaluate for evidence of arrhythmia and/or significant heart rate changes  77 yo F here with aphasia, L sided weakness. H/o CVA. Pt activated as a CODE STROKE and taken to CT scanner with Dr. Thad Ranger of Neurology. CT Head reviewed and shows no ICH. Pt taken for MR as well which shows no acute ischemia. Suspect TIA, versus possible seizure w/ post-ictal sx. Pt has a h/o seizures. She remains slightly  off baseline, with aphasia and L sided weakness. Will admit to medicine. IV keppra given.  Labs o/w unremarkable. CBC without leukocytosis or anemia. CMP with normal renal function.    FINAL CLINICAL IMPRESSION(S) / ED DIAGNOSES   Final diagnoses:  Stroke-like symptoms     Rx / DC Orders   ED Discharge Orders     None        Note:  This  document was prepared using Conservation officer, historic buildings and may include unintentional dictation errors.   Shaune Pollack, MD 10/21/22 [phone]

## 2022-10-21 NOTE — H&P (Addendum)
History and Physical    Patient: Samantha Aguirre OZH:[id number] DOB: 1945-04-06 DOA: 10/21/2022 DOS: the patient was seen and examined on 10/21/2022 PCP: Pcp, No  Patient coming from:ALF  Chief Complaint:  Chief Complaint  Patient presents with   Code Stroke   HPI: Samantha Aguirre is a 77 y.o. female with medical history significant for prior CVA with right-sided hemiparesis, history of TIA in 04/24 treated with dual antiplatelet therapy for 21 days and currently on mono therapy with Plavix,  history of A-fib not on long-term anticoagulation due to history of GI bleed, history of vascular dementia, seizure disorder, coronary artery disease, diabetes mellitus on oral agents who presents to the emergency room via EMS for evaluation of sudden onset left-sided weakness and left facial droop which happened at about 9:30 AM. She arrived the emergency room 1 hour after the onset of her symptoms and a code stroke was called.  She was seen in consultation by neurology and was noted to have an NIHSS score of 5 with only 2 of this was felt to be new symptoms.  Due to minimally nondisabling symptoms, patient was not considered a candidate for thrombolytic or thrombectomy. At the time of my evaluation her symptoms have improved but not completely resolved.  She complains of a headache which is chronic but denies having any blurred vision, no difficulty swallowing, no dizziness, no lightheadedness, no falls.  She also had some left-sided chest pain which has resolved.  She denies having any nausea, no vomiting, no abdominal pain, no changes in her bowel habits, no urinary symptoms, no focal deficit.  CT scan of the head with contrast shows no evidence of acute intracranial abnormality. ASPECTS of 10. Extensive chronic small vessel ischemic disease with chronic infarcts as above.                                                                                                                                  MRI  of the brain without contrast shows no acute intracranial process. Sequela of severe chronic microvascular ischemic change with chronic infarcts in the left temporoparietal region and the bilateral cerebellar hemispheres. MRA of the head and neck shows high-grade narrowing at the A2 segment of the left ACA. No large vessel occlusion in the head or neck. Twelve-lead EKG reviewed by me shows sinus rhythm with low voltage QRS Patient will be referred to observation status since she is not back to her baseline  Review of Systems: As mentioned in the history of present illness. All other systems reviewed and are negative. Past Medical History:  Diagnosis Date   Acid reflux    Arthritis    Bilateral knees, hands, feet   Asthma    Atrial fibrillation (HCC)    Cancer (HCC)    CHF (congestive heart failure) (HCC)    Diastolic CHF   COPD (chronic obstructive pulmonary disease) (HCC)    Coronary artery disease  Diabetes mellitus without complication (HCC)    Frequent headaches    GI bleed    HLD (hyperlipidemia)    Hypertension    Seizures (HCC)    Skin cancer 2015   Suspected basal cell carcinoma on nose, surgically removed   Stroke (HCC) 04/2017   Thyroid disease    Tremors of nervous system    Past Surgical History:  Procedure Laterality Date   ABDOMINAL HYSTERECTOMY  1976   APPENDECTOMY  1980   BACK SURGERY     cardiac stents     CAROTID ENDARTERECTOMY     CHOLECYSTECTOMY  1980   COLONOSCOPY N/A 02/20/2017   Procedure: COLONOSCOPY;  Surgeon: Toney Reil, MD;  Location: [facility] ENDOSCOPY;  Service: Gastroenterology;  Laterality: N/A;   ESOPHAGOGASTRODUODENOSCOPY N/A 02/20/2017   Procedure: ESOPHAGOGASTRODUODENOSCOPY (EGD);  Surgeon: Toney Reil, MD;  Location: [facility] ENDOSCOPY;  Service: Gastroenterology;  Laterality: N/A;   ESOPHAGOGASTRODUODENOSCOPY (EGD) WITH PROPOFOL N/A 07/08/2017   Procedure: ESOPHAGOGASTRODUODENOSCOPY (EGD) WITH PROPOFOL;  Surgeon: Midge Minium, MD;   Location: [facility] ENDOSCOPY;  Service: Endoscopy;  Laterality: N/A;   GALLBLADDER SURGERY  1980   GIVENS CAPSULE STUDY N/A 07/09/2017   Procedure: GIVENS CAPSULE STUDY;  Surgeon: Wyline Mood, MD;  Location: [facility] ENDOSCOPY;  Service: Gastroenterology;  Laterality: N/A;   KNEE SURGERY     left   KYPHOPLASTY N/A 03/16/2018   Procedure: KYPHOPLASTY, L5;  Surgeon: Kennedy Bucker, MD;  Location: [facility] ORS;  Service: Orthopedics;  Laterality: N/A;   REVERSE SHOULDER ARTHROPLASTY Right 08/25/2019   Procedure: REVERSE SHOULDER ARTHROPLASTY;  Surgeon: Christena Flake, MD;  Location: [facility] ORS;  Service: Orthopedics;  Laterality: Right;   ROTATOR CUFF REPAIR     right   TONSILLECTOMY  1950   Social History:  reports that she has been smoking cigarettes. She has a 53 pack-year smoking history. She has never used smokeless tobacco. She reports that she does not drink alcohol and does not use drugs.  Allergies  Allergen Reactions   Ivp Dye [Iodinated Contrast Media] Itching    Pt injected with IV contrast.  5 min after injection pt complained of itching behind ear and on her abd.   Methotrexate Rash   Morphine And Codeine Other (See Comments)    "stops my breathing" NEAR-RESPIRATORY ARREST   Mushroom Extract Complex Anaphylaxis    Made patient "deathly sick"   Atenolol Other (See Comments)    "MD took me off of it bc it was doing something wrong" Made patient HYPOTENSIVE   Percocet [Oxycodone-Acetaminophen] Nausea And Vomiting and Other (See Comments)    Stomach pains   Percodan [Oxycodone-Aspirin] Nausea And Vomiting and Other (See Comments)    Stomach pains   Tramadol Nausea Only   Verapamil Other (See Comments)    " MD told me this was screwing me up" MAKES PATIENT HYPOTENSIVE   Oxycodone Hcl     Family History  Problem Relation Age of Onset   Breast cancer Mother    Heart disease Mother    Stroke Mother    Cancer Mother    COPD Mother    Diabetes Mother    Heart disease Father    Diabetes  Father    Stroke Father    Alcohol abuse Sister    Drug abuse Sister    Stroke Sister    Cancer Sister    Mental illness Sister    Heart disease Brother    Arthritis Brother    Diabetes Brother    Heart  disease Maternal Grandfather    Heart disease Paternal Grandfather     Prior to Admission medications   Medication Sig Start Date End Date Taking? Authorizing Provider  acetaminophen (TYLENOL) 500 MG tablet Take 500 mg by mouth 3 (three) times daily. 08/08/20   [provider]  albuterol (PROVENTIL HFA;VENTOLIN HFA) 108 (90 Base) MCG/ACT inhaler Inhale 2 puffs into the lungs every 6 (six) hours as needed for wheezing or shortness of breath. 10/08/16   Karamalegos, Netta Neat, DO  atorvastatin (LIPITOR) 10 MG tablet Take 10 mg by mouth daily.    [provider]  Cholecalciferol 50 MCG (2000 UT) TABS Take 2,000 Units by mouth daily.    [provider]  clopidogrel (PLAVIX) 75 MG tablet Take 1 tablet (75 mg total) by mouth daily. 05/11/22   Lurene Shadow, MD  diclofenac Sodium (VOLTAREN) 1 % GEL Apply 2 g topically 3 (three) times daily.    [provider]  DULoxetine (CYMBALTA) 60 MG capsule Take 1 capsule (60 mg total) by mouth daily. 05/11/22   Lurene Shadow, MD  ferrous sulfate 325 (65 FE) MG EC tablet Take 1 tablet (325 mg total) by mouth daily. 05/11/22   Lurene Shadow, MD  folic acid (FOLVITE) 1 MG tablet Take 1 tablet (1 mg total) by mouth daily. 09/13/19   Kathlen Mody, MD  furosemide (LASIX) 20 MG tablet Take 20 mg by mouth daily.  10/07/18   [provider]  gabapentin (NEURONTIN) 300 MG capsule Take 300 mg by mouth 3 (three) times daily.    [provider]  glipiZIDE-metformin (METAGLIP) 5-500 MG tablet Take 1 tablet by mouth daily. 05/11/22   Lurene Shadow, MD  hydrocortisone cream 1 % Apply 1 Application topically 2 (two) times daily as needed for itching.    [provider]  levETIRAcetam (KEPPRA) 500 MG tablet Take  1 tablet (500 mg total) by mouth 2 (two) times daily. 09/19/15   Katha Hamming, MD  levothyroxine (SYNTHROID) 100 MCG tablet Take 125 mcg by mouth daily before breakfast. 06/16/19   [provider]  lisinopril (ZESTRIL) 10 MG tablet Take 10 mg by mouth daily. 04/18/22   [provider]  Melatonin 10 MG TABS Take 10 mg by mouth at bedtime.    [provider]  nystatin (MYCOSTATIN/NYSTOP) powder Apply 1 Application topically 2 (two) times daily as needed (irritation).    [provider]  omeprazole (PRILOSEC) 40 MG capsule Take 20 mg by mouth daily. 09/02/17   [provider]  ondansetron (ZOFRAN) 4 MG tablet Take 4 mg by mouth every 6 (six) hours as needed for nausea. 04/17/22   [provider]  vitamin B-12 (CYANOCOBALAMIN) 1000 MCG tablet Take 1,000 mcg by mouth daily. Patient not taking: Reported on 05/09/2022    [provider]    Physical Exam: Vitals:   10/21/22 1230 10/21/22 1300 10/21/22 1330 10/21/22 1400  BP: (!) 105/40 (!) 101/41 (!) 115/50 (!) 107/34  Pulse: (!) 51 (!) 51 (!) 52 70  Resp: 20 (!) 22 (!) 21 18  Temp:      TempSrc:      SpO2: 99% 98% 98% 95%  Weight:      Height:       Physical Exam Vitals and nursing note reviewed.  Constitutional:      Appearance: Normal appearance.  HENT:     Head: Normocephalic and atraumatic.     Nose: Nose normal.     Mouth/Throat:  Mouth: Mucous membranes are moist.  Eyes:     Conjunctiva/sclera: Conjunctivae normal.  Cardiovascular:     Rate and Rhythm: Normal rate and regular rhythm.  Pulmonary:     Effort: Pulmonary effort is normal.     Breath sounds: Normal breath sounds.  Abdominal:     General: Bowel sounds are normal.     Palpations: Abdomen is soft.     Comments: Central adiposity  Musculoskeletal:        General: Normal range of motion.     Cervical back: Normal range of motion and neck supple.  Skin:    General: Skin is warm and dry.  Neurological:      General: No focal deficit present.     Mental Status: She is alert and oriented to person, place, and time.     Comments: Able to move all extremities.  Mild right-sided weakness from prior stroke.  Strength 5/5 involving the left upper and lower extremity  Psychiatric:        Mood and Affect: Mood normal.        Behavior: Behavior normal.     Data Reviewed: Relevant notes from primary care and specialist visits, past discharge summaries as available in EHR, including Care Everywhere. Prior diagnostic testing as pertinent to current admission diagnoses Updated medications and problem lists for reconciliation ED course, including vitals, labs, imaging, treatment and response to treatment Triage notes, nursing and pharmacy notes and ED provider's notes Notable results as noted in HPI Labs reviewed.  Urinalysis stable.  PT 12.8, INR 1.0, sodium 139, potassium 3.6, chloride 105, bicarb 25, glucose 130, BUN 18, creatinine 0.76, calcium 8.6, total protein 6.6, albumin 3.6, AST 13, ALT 9, alkaline phosphatase 101, total bilirubin 0.6, white count 6.9, hemoglobin 11.9, hematocrit 36.9, platelet count 223 There are no new results to review at this time.  Assessment and Plan: * TIA (transient ischemic attack) Patient with a prior history of CVA with right-sided weakness who presents to the ER as a code stroke for sudden onset left-sided weakness to the left facial droop. Initial CT scan of the head without contrast was negative for bleed and showed extensive chronic small vessel ischemic disease with chronic infarcts as above. Symptoms have improved but not completely resolved Patient unable to have a CT angiogram due to contrast allergy and had an MRA of the head and neck which showed high-grade narrowing at the A2 segment of the left ACA. No large vessel occlusion in the head or neck. MRI of the brain showed no acute intracranial process. Sequela of severe chronic microvascular ischemic change  with chronic infarcts in the left temporoparietal region and the bilateral cerebellar hemispheres. Continue Plavix and statins. PT/OT/ST consult Patient had a 2D echocardiogram in 04/24 which showed a normal LVEF and no evidence of an interatrial shunt.  Will not repeat 2D echocardiogram at this time. Neurology consult  Vascular dementia without behavioral disturbance (HCC) Stable Patient appears to be at her baseline mental status.  PAF (paroxysmal atrial fibrillation) (HCC) Paroxysmal Not on long-term anticoagulation due to history of GI bleed from angiodysplasia of the stomach  Recurrent major depressive disorder, in partial remission (HCC) Stable Continue duloxetine  Hypertension Blood pressure stable on lisinopril  Seizure (HCC) History of seizure-like activity. Was on Keppra and was weaned off by her neurologist due to no confirmed history of seizures and no documented EEG evidence of same.  Also documented that patient had behavioral side effects Patient has been in [place] 300  mg 3 times a day for seizure prevention and neuropathy. Neurology consult about the need to resume antiepileptic therapy  COPD (chronic obstructive pulmonary disease) (HCC) Stable and not acutely exacerbated Continue as needed bronchodilator therapy  Type 2 diabetes mellitus with other specified complication (HCC) Maintain consistent carbohydrate diet Hold oral hypoglycemic agent Glycemic control with sliding scale insulin  Other specified hypothyroidism Continue Synthroid      Advance Care Planning:   Code Status: Full Code   Consults: Neurology  Family Communication: This close plan of care with patient's daughter Nekia Maxham over the phone.  All questions and concerns have been addressed.  She verbalizes understanding and agrees to the plan.  Severity of Illness: The appropriate patient status for this patient is OBSERVATION. Observation status is judged to be reasonable and necessary  in order to provide the required intensity of service to ensure the patient's safety. The patient's presenting symptoms, physical exam findings, and initial radiographic and laboratory data in the context of their medical condition is felt to place them at decreased risk for further clinical deterioration. Furthermore, it is anticipated that the patient will be medically stable for discharge from the hospital within 2 midnights of admission.   Author: Lucile Shutters, MD 10/21/2022 3:35 PM  For on call review [email or web address].

## 2022-10-21 NOTE — Assessment & Plan Note (Signed)
Stable  Continue duloxetine

## 2022-10-21 NOTE — ED Triage Notes (Addendum)
EMS called out to [facility]; patient woke up at 0630 and was at baseline normal; at 0930 developed left sided weakness and left facial droop. Per EMS en route facial droop improved for a small period of time and then returned. Patient currently on Plavix.

## 2022-10-21 NOTE — Progress Notes (Signed)
  Chaplain On-Call responded to Code Stroke notification at 1019 hours.  The patient was already at the CT Scan area. No family is present.  Chaplain assured ED Staff of availability as needed.  Chaplain Evelena Peat M.Div., [facility]

## 2022-10-21 NOTE — ED Notes (Signed)
Dr. Thad Ranger in CT 1 assessing patient

## 2022-10-21 NOTE — Assessment & Plan Note (Signed)
Paroxysmal Not on long-term anticoagulation due to history of GI bleed from angiodysplasia of the stomach

## 2022-10-21 NOTE — Assessment & Plan Note (Signed)
Continue Synthroid °

## 2022-10-21 NOTE — Assessment & Plan Note (Signed)
Stable and not acutely exacerbated Continue as needed bronchodilator therapy 

## 2022-10-21 NOTE — Assessment & Plan Note (Signed)
Blood pressure stable on lisinopril 

## 2022-10-21 NOTE — Progress Notes (Signed)
Patient to MRI from CT department

## 2022-10-21 NOTE — Progress Notes (Addendum)
Cart activation Stroke Alert at 1031. Patient in CT department on alert. [facility] Neurologist, Dr Thana Farr, paged at [phone] and arrived in CT department at 1036. MRS 1 per Dr Thad Ranger discussion with patient.

## 2022-10-21 NOTE — Consult Note (Addendum)
Requesting Physician: Erma Heritage    Chief Complaint: Left sided weakness  HPI: Samantha Aguirre is an 77 y.o. female with a history of afib on Plavix and no AC due to history of GIB, CAD, DM, HLD, HTN, stroke and seizures who presents with complaints of left face and left leg weakness.  Has a history of stroke with residual right hemiparesis and right sided visual changes.    Modified Rankin: 1 LSN: 0900 on 10/21/2022 TNK Given: No: Minimal nondisabling symptoms  Thrombectomy Candidate:  No, no target lesion identified. Minimal nondisabling symptoms  Past Medical History:  Diagnosis Date   Acid reflux    Arthritis    Bilateral knees, hands, feet   Asthma    Atrial fibrillation (HCC)    Cancer (HCC)    CHF (congestive heart failure) (HCC)    Diastolic CHF   COPD (chronic obstructive pulmonary disease) (HCC)    Coronary artery disease    Diabetes mellitus without complication (HCC)    Frequent headaches    GI bleed    HLD (hyperlipidemia)    Hypertension    Seizures (HCC)    Skin cancer 2015   Suspected basal cell carcinoma on nose, surgically removed   Stroke (HCC) 04/2017   Thyroid disease    Tremors of nervous system     Past Surgical History:  Procedure Laterality Date   ABDOMINAL HYSTERECTOMY  1976   APPENDECTOMY  1980   BACK SURGERY     cardiac stents     CAROTID ENDARTERECTOMY     CHOLECYSTECTOMY  1980   COLONOSCOPY N/A 02/20/2017   Procedure: COLONOSCOPY;  Surgeon: Toney Reil, MD;  Location: [facility] ENDOSCOPY;  Service: Gastroenterology;  Laterality: N/A;   ESOPHAGOGASTRODUODENOSCOPY N/A 02/20/2017   Procedure: ESOPHAGOGASTRODUODENOSCOPY (EGD);  Surgeon: Toney Reil, MD;  Location: [facility] ENDOSCOPY;  Service: Gastroenterology;  Laterality: N/A;   ESOPHAGOGASTRODUODENOSCOPY (EGD) WITH PROPOFOL N/A 07/08/2017   Procedure: ESOPHAGOGASTRODUODENOSCOPY (EGD) WITH PROPOFOL;  Surgeon: Midge Minium, MD;  Location: [facility] ENDOSCOPY;  Service: Endoscopy;  Laterality: N/A;    GALLBLADDER SURGERY  1980   GIVENS CAPSULE STUDY N/A 07/09/2017   Procedure: GIVENS CAPSULE STUDY;  Surgeon: Wyline Mood, MD;  Location: [facility] ENDOSCOPY;  Service: Gastroenterology;  Laterality: N/A;   KNEE SURGERY     left   KYPHOPLASTY N/A 03/16/2018   Procedure: KYPHOPLASTY, L5;  Surgeon: Kennedy Bucker, MD;  Location: [facility] ORS;  Service: Orthopedics;  Laterality: N/A;   REVERSE SHOULDER ARTHROPLASTY Right 08/25/2019   Procedure: REVERSE SHOULDER ARTHROPLASTY;  Surgeon: Christena Flake, MD;  Location: [facility] ORS;  Service: Orthopedics;  Laterality: Right;   ROTATOR CUFF REPAIR     right   TONSILLECTOMY  1950    Family History  Problem Relation Age of Onset   Breast cancer Mother    Heart disease Mother    Stroke Mother    Cancer Mother    COPD Mother    Diabetes Mother    Heart disease Father    Diabetes Father    Stroke Father    Alcohol abuse Sister    Drug abuse Sister    Stroke Sister    Cancer Sister    Mental illness Sister    Heart disease Brother    Arthritis Brother    Diabetes Brother    Heart disease Maternal Grandfather    Heart disease Paternal Grandfather    Social History:  reports that she has been smoking cigarettes. She has a 53 pack-year smoking history. She has  never used smokeless tobacco. She reports that she does not drink alcohol and does not use drugs.  Allergies:  Allergies  Allergen Reactions   Ivp Dye [Iodinated Contrast Media] Itching    Pt injected with IV contrast.  5 min after injection pt complained of itching behind ear and on her abd.   Methotrexate Rash   Morphine And Codeine Other (See Comments)    "stops my breathing" NEAR-RESPIRATORY ARREST   Mushroom Extract Complex Anaphylaxis    Made patient "deathly sick"   Atenolol Other (See Comments)    "MD took me off of it bc it was doing something wrong" Made patient HYPOTENSIVE   Percocet [Oxycodone-Acetaminophen] Nausea And Vomiting and Other (See Comments)    Stomach pains    Percodan [Oxycodone-Aspirin] Nausea And Vomiting and Other (See Comments)    Stomach pains   Tramadol Nausea Only   Verapamil Other (See Comments)    " MD told me this was screwing me up" MAKES PATIENT HYPOTENSIVE   Oxycodone Hcl     Medications:  Prior to Admission medications   Medication Sig Start Date End Date Taking? Authorizing Provider  acetaminophen (TYLENOL) 500 MG tablet Take 500 mg by mouth 3 (three) times daily. 08/08/20   [provider]  albuterol (PROVENTIL HFA;VENTOLIN HFA) 108 (90 Base) MCG/ACT inhaler Inhale 2 puffs into the lungs every 6 (six) hours as needed for wheezing or shortness of breath. 10/08/16   Karamalegos, Netta Neat, DO  atorvastatin (LIPITOR) 10 MG tablet Take 10 mg by mouth daily.    [provider]  Cholecalciferol 50 MCG (2000 UT) TABS Take 2,000 Units by mouth daily.    [provider]  clopidogrel (PLAVIX) 75 MG tablet Take 1 tablet (75 mg total) by mouth daily. 05/11/22   Lurene Shadow, MD  diclofenac Sodium (VOLTAREN) 1 % GEL Apply 2 g topically 3 (three) times daily.    [provider]  DULoxetine (CYMBALTA) 60 MG capsule Take 1 capsule (60 mg total) by mouth daily. 05/11/22   Lurene Shadow, MD  ferrous sulfate 325 (65 FE) MG EC tablet Take 1 tablet (325 mg total) by mouth daily. 05/11/22   Lurene Shadow, MD  folic acid (FOLVITE) 1 MG tablet Take 1 tablet (1 mg total) by mouth daily. 09/13/19   Kathlen Mody, MD  furosemide (LASIX) 20 MG tablet Take 20 mg by mouth daily.  10/07/18   [provider]  gabapentin (NEURONTIN) 300 MG capsule Take 300 mg by mouth 3 (three) times daily.    [provider]  glipiZIDE-metformin (METAGLIP) 5-500 MG tablet Take 1 tablet by mouth daily. 05/11/22   Lurene Shadow, MD  hydrocortisone cream 1 % Apply 1 Application topically 2 (two) times daily as needed for itching.    [provider]  levETIRAcetam (KEPPRA) 500 MG tablet Take 1 tablet (500 mg total) by  mouth 2 (two) times daily. 09/19/15   Katha Hamming, MD  levothyroxine (SYNTHROID) 100 MCG tablet Take 125 mcg by mouth daily before breakfast. 06/16/19   [provider]  lisinopril (ZESTRIL) 10 MG tablet Take 10 mg by mouth daily. 04/18/22   [provider]  Melatonin 10 MG TABS Take 10 mg by mouth at bedtime.    [provider]  nystatin (MYCOSTATIN/NYSTOP) powder Apply 1 Application topically 2 (two) times daily as needed (irritation).    [provider]  omeprazole (PRILOSEC) 40 MG capsule Take 20 mg by mouth daily. 09/02/17   [provider]  ondansetron (ZOFRAN) 4 MG tablet Take 4 mg by mouth every 6 (six) hours as needed for nausea. 04/17/22   [provider]  vitamin B-12 (CYANOCOBALAMIN) 1000 MCG tablet Take 1,000 mcg by mouth daily. Patient not taking: Reported on 05/09/2022    [provider]     Physical Examination: Blood pressure (!) 115/50, pulse (!) 52, temperature 98.2 F (36.8 C), temperature source Oral, resp. rate (!) 21, height 5\' 1"  (1.549 m), weight 78.1 kg, SpO2 98%.  Neurologic Examination: NIHSS components Score: Comment  1a Level of Conscious 0[x]  1[]  2[]  3[]      1b LOC Questions 0[x]  1[]  2[]       1c LOC Commands 0[x]  1[]  2[]       2 Best Gaze 0[x]  1[]  2[]       3 Visual 0[]  1[x]  2[]  3[]      4 Facial Palsy 0[]  1[x]  2[]  3[]      5a Motor Arm - left 0[x]  1[]  2[]  3[]  4[]  UN[]    5b Motor Arm - Right 0[]  1[x]  2[]  3[]  4[]  UN[]    6a Motor Leg - Left 0[x]  1[]  2[]  3[]  4[]  UN[]    6b Motor Leg - Right 0[x]  1[]  2[]  3[]  4[]  UN[]    7 Limb Ataxia 0[x]  1[]  2[]  3[]  UN[]     8 Sensory 0[]  1[x]  2[]  UN[]      9 Best Language 0[x]  1[]  2[]  3[]      10 Dysarthria 0[]  1[x]  2[]  UN[]      11 Extinct. and Inattention 0[x]  1[]  2[]       TOTAL: 5      Results for orders placed or performed during the hospital encounter of 10/21/22 (from the past 48 hour(s))  Glucose, capillary     Status: Abnormal   Collection Time: 10/21/22  10:28 AM  Result Value Ref Range   Glucose-Capillary 160 (H) 70 - 99 mg/dL    Comment: Glucose reference range applies only to samples taken after fasting for at least 8 hours.  Ethanol     Status: None   Collection Time: 10/21/22 10:30 AM  Result Value Ref Range   Alcohol, Ethyl (B) <10 <10 mg/dL    Comment: (NOTE) Lowest detectable limit for serum alcohol is 10 mg/dL.  For medical purposes only. Performed at [facility], [address] [place], [place] [address]   Protime-INR     Status: None   Collection Time: 10/21/22 10:30 AM  Result Value Ref Range   Prothrombin Time 12.8 11.4 - 15.2 seconds   INR 1.0 0.8 - 1.2    Comment: (NOTE) INR goal varies based on device and disease states. Performed at [facility], [address] [place], [place] [address]   APTT     Status: None   Collection Time: 10/21/22 10:30 AM  Result Value Ref Range   aPTT 27 24 - 36 seconds    Comment: Performed at [facility], [address] [place], [place] [address]  CBC     Status: Abnormal   Collection Time: 10/21/22 10:30 AM  Result Value Ref Range   WBC 6.9 4.0 - 10.5 K/uL   RBC 4.00 3.87 - 5.11 MIL/uL   Hemoglobin 11.9 (L) 12.0 - 15.0 g/dL   HCT 91.4 78.2 - 95.6 %   MCV 92.3 80.0 - 100.0 fL   MCH 29.8 26.0 - 34.0 pg   MCHC 32.2 30.0 - 36.0 g/dL   RDW 21.3 08.6 - 57.8 %   Platelets 223 150 - 400 K/uL  nRBC 0.0 0.0 - 0.2 %    Comment: Performed at [facility], [address] [place], [place] [address]  Differential     Status: None   Collection Time: 10/21/22 10:30 AM  Result Value Ref Range   Neutrophils Relative % 63 %   Neutro Abs 4.4 1.7 - 7.7 K/uL   Lymphocytes Relative 26 %   Lymphs Abs 1.8 0.7 - 4.0 K/uL   Monocytes Relative 8 %   Monocytes Absolute 0.5 0.1 - 1.0 K/uL   Eosinophils Relative 2 %   Eosinophils Absolute 0.1 0.0 - 0.5 K/uL   Basophils Relative 1 %   Basophils Absolute 0.0 0.0 - 0.1 K/uL   Immature Granulocytes 0 %    Abs Immature Granulocytes 0.02 0.00 - 0.07 K/uL    Comment: Performed at [facility], [address] [place], [place] [address]  Comprehensive metabolic panel     Status: Abnormal   Collection Time: 10/21/22 10:30 AM  Result Value Ref Range   Sodium 139 135 - 145 mmol/L   Potassium 3.6 3.5 - 5.1 mmol/L   Chloride 105 98 - 111 mmol/L   CO2 25 22 - 32 mmol/L   Glucose, Bld 130 (H) 70 - 99 mg/dL    Comment: Glucose reference range applies only to samples taken after fasting for at least 8 hours.   BUN 18 8 - 23 mg/dL   Creatinine, Ser 5.78 0.44 - 1.00 mg/dL   Calcium 8.6 (L) 8.9 - 10.3 mg/dL   Total Protein 6.6 6.5 - 8.1 g/dL   Albumin 3.6 3.5 - 5.0 g/dL   AST 13 (L) 15 - 41 U/L   ALT 9 0 - 44 U/L   Alkaline Phosphatase 101 38 - 126 U/L   Total Bilirubin 0.6 0.3 - 1.2 mg/dL   GFR, Estimated >46 >96 mL/min    Comment: (NOTE) Calculated using the CKD-EPI Creatinine Equation (2021)    Anion gap 9 5 - 15    Comment: Performed at [facility], [address], [place], [place] [address]  Urine Drug Screen, Qualitative     Status: None   Collection Time: 10/21/22 11:36 AM  Result Value Ref Range   Tricyclic, Ur Screen NONE DETECTED NONE DETECTED   Amphetamines, Ur Screen NONE DETECTED NONE DETECTED   MDMA (Ecstasy)Ur Screen NONE DETECTED NONE DETECTED   Cocaine Metabolite,Ur [place] NONE DETECTED NONE DETECTED   Opiate, Ur Screen NONE DETECTED NONE DETECTED   Phencyclidine (PCP) Ur S NONE DETECTED NONE DETECTED   Cannabinoid 50 Ng, Ur [place] NONE DETECTED NONE DETECTED   Barbiturates, Ur Screen NONE DETECTED NONE DETECTED   Benzodiazepine, Ur Scrn NONE DETECTED NONE DETECTED   Methadone Scn, Ur NONE DETECTED NONE DETECTED    Comment: (NOTE) Tricyclics + metabolites, urine    Cutoff 1000 ng/mL Amphetamines + metabolites, urine  Cutoff 1000 ng/mL MDMA (Ecstasy), urine              Cutoff 500 ng/mL Cocaine Metabolite, urine          Cutoff 300 ng/mL Opiate +  metabolites, urine        Cutoff 300 ng/mL Phencyclidine (PCP), urine         Cutoff 25 ng/mL Cannabinoid, urine                 Cutoff 50 ng/mL Barbiturates + metabolites, urine  Cutoff 200 ng/mL Benzodiazepine, urine  Cutoff 200 ng/mL Methadone, urine                   Cutoff 300 ng/mL  The urine drug screen provides only a preliminary, unconfirmed analytical test result and should not be used for non-medical purposes. Clinical consideration and professional judgment should be applied to any positive drug screen result due to possible interfering substances. A more specific alternate chemical method must be used in order to obtain a confirmed analytical result. Gas chromatography / mass spectrometry (GC/MS) is the preferred confirm atory method. Performed at [facility], [address] [place], [place] [address]   Urinalysis, Routine w reflex microscopic -Urine, Clean Catch     Status: Abnormal   Collection Time: 10/21/22 11:36 AM  Result Value Ref Range   Color, Urine STRAW (A) YELLOW   APPearance CLEAR (A) CLEAR   Specific Gravity, Urine 1.009 1.005 - 1.030   pH 5.0 5.0 - 8.0   Glucose, UA NEGATIVE NEGATIVE mg/dL   Hgb urine dipstick NEGATIVE NEGATIVE   Bilirubin Urine NEGATIVE NEGATIVE   Ketones, ur NEGATIVE NEGATIVE mg/dL   Protein, ur NEGATIVE NEGATIVE mg/dL   Nitrite NEGATIVE NEGATIVE   Leukocytes,Ua NEGATIVE NEGATIVE    Comment: Performed at [facility], [address] [place], [place] [address]   MR ANGIO HEAD WO CONTRAST  Result Date: 10/21/2022 CLINICAL DATA:  Stroke suspected EXAM: MRA HEAD WITHOUT CONTRAST MRA NECK WITHOUT AND WITH CONTRAST TECHNIQUE: Angiographic images of the Circle of Willis were acquired using MRA technique without intravenous contrast. Angiographic images of the neck were acquired using MRA technique without and with intravenous contrast. Carotid stenosis measurements (when applicable) are obtained utilizing  NASCET criteria, using the distal internal carotid diameter as the denominator. CONTRAST:  9mL GADAVIST GADOBUTROL 1 MMOL/ML IV SOLN COMPARISON:  None Available. FINDINGS: MRA HEAD FINDINGS Anterior circulation: High-grade narrowing of the A2 segment of the left ACA (series 5, image 140). Posterior circulation: No proximal occlusion. No significant stenosis. No aneurysm. Anatomic variants: None MRA NECK FINDINGS Aortic arch: Three-vessel arch. No dissection. No stenosis. No significant plaque. Right carotid system: No dissection. No occlusion. No hemodynamically significant stenosis. Left carotid system: No dissection. No occlusion. No hemodynamically significant stenosis. Vertebral arteries: Mild narrowing at the origin of bilateral vertebral arteries. Other: None IMPRESSION: 1. High-grade narrowing at the A2 segment of the left ACA. 2. No large vessel occlusion in the head or neck. Electronically Signed   By: Lorenza Cambridge M.D.   On: 10/21/2022 12:50   MR ANGIO NECK W WO CONTRAST  Result Date: 10/21/2022 CLINICAL DATA:  Stroke suspected EXAM: MRA HEAD WITHOUT CONTRAST MRA NECK WITHOUT AND WITH CONTRAST TECHNIQUE: Angiographic images of the Circle of Willis were acquired using MRA technique without intravenous contrast. Angiographic images of the neck were acquired using MRA technique without and with intravenous contrast. Carotid stenosis measurements (when applicable) are obtained utilizing NASCET criteria, using the distal internal carotid diameter as the denominator. CONTRAST:  9mL GADAVIST GADOBUTROL 1 MMOL/ML IV SOLN COMPARISON:  None Available. FINDINGS: MRA HEAD FINDINGS Anterior circulation: High-grade narrowing of the A2 segment of the left ACA (series 5, image 140). Posterior circulation: No proximal occlusion. No significant stenosis. No aneurysm. Anatomic variants: None MRA NECK FINDINGS Aortic arch: Three-vessel arch. No dissection. No stenosis. No significant plaque. Right carotid system: No  dissection. No occlusion. No hemodynamically significant stenosis. Left carotid system: No dissection. No occlusion. No hemodynamically significant stenosis. Vertebral arteries: Mild narrowing at the  origin of bilateral vertebral arteries. Other: None IMPRESSION: 1. High-grade narrowing at the A2 segment of the left ACA. 2. No large vessel occlusion in the head or neck. Electronically Signed   By: Lorenza Cambridge M.D.   On: 10/21/2022 12:50   [name]  Result Date: 10/21/2022 CLINICAL DATA:  Stroke, follow up EXAM: MRI HEAD WITHOUT CONTRAST TECHNIQUE: Multiplanar, multiecho pulse sequences of the brain and surrounding structures were obtained without intravenous contrast. COMPARISON:  Brain MR 05/09/22 FINDINGS: Brain: Negative for an acute infarct. No hemorrhage. No hydrocephalus. Extra-axial fluid collection. Unchanged T2 hypointense extra-axial lesion along the falx measuring 8 x 8 mm, likely a meningioma series 9, image 47). Sequela severe chronic microvascular ischemic change with chronic infarcts in the left temporoparietal region and the bilateral cerebellar hemispheres Vascular: Normal flow voids. Skull and upper cervical spine: Incompletely imaged Sinuses/Orbits: No middle ear or mastoid effusion. Paranasal sinuses are grossly clear. Orbits are unremarkable. Other: None IMPRESSION: 1. No acute intracranial process. 2. Sequela of severe chronic microvascular ischemic change with chronic infarcts in the left temporoparietal region and the bilateral cerebellar hemispheres. Electronically Signed   By: Lorenza Cambridge M.D.   On: 10/21/2022 12:40   CT HEAD CODE STROKE WO CONTRAST  Result Date: 10/21/2022 CLINICAL DATA:  Code stroke. Neuro deficit, acute, stroke suspected. Left sided weakness. EXAM: CT HEAD WITHOUT CONTRAST TECHNIQUE: Contiguous axial images were obtained from the base of the skull through the vertex without intravenous contrast. RADIATION DOSE REDUCTION: This exam was performed  according to the departmental dose-optimization program which includes automated exposure control, adjustment of the mA and/or kV according to patient size and/or use of iterative reconstruction technique. COMPARISON:  Head CT and MRI 05/09/2022 FINDINGS: Brain: There is no evidence of an acute infarct, intracranial hemorrhage, midline shift, or extra-axial fluid collection. A moderate-sized chronic infarct in the posterior left cerebral hemisphere and small chronic bilateral cerebellar infarcts are unchanged. There is a background of extensive chronic small vessel ischemic disease in the cerebral white matter. Vascular: Calcified atherosclerosis at the skull base. No hyperdense vessel. Skull: No acute fracture or suspicious osseous lesion. Sinuses/Orbits: Opacification of a mid right ethmoid air cell. Clear mastoid air cells. Unremarkable orbits. Other: None. ASPECTS (Alberta Stroke Program Early CT Score) - Ganglionic level infarction (caudate, lentiform nuclei, internal capsule, insula, M1-M3 cortex): 7 - Supraganglionic infarction (M4-M6 cortex): 3 Total score (0-10 with 10 being normal): 10 These results were communicated to Dr. Thad Ranger at 11:04 am on 10/21/2022 by text page via the [facility] messaging system. IMPRESSION: 1. No evidence of acute intracranial abnormality. ASPECTS of 10. 2. Extensive chronic small vessel ischemic disease with chronic infarcts as above. Electronically Signed   By: Sebastian Ache M.D.   On: 10/21/2022 11:05    Assessment: 77 y.o. female with a history of afib with history of GIB on Plavix, CAD, DM, HLD, HTN, stroke and seizures who presents with complaints of left faace and left leg weakness.  Has a history of stroke with residual right hemiparesis and right sided visual changes.  Although NIHSS of 5, only 2 of this are felt to be secondary to new changes.  Due to minimal nondisabling symptoms patient not considered a thrombolytic or thrombectomy candidate.  Patient allergic to  contrast dye therefore patient sent for MRI/MRA of the brain.  MRI personally reviewed and shows no acute changes.  MRA shows high grade narrowing of the left ACA.  Although no acute infarct on imaging and patient  not a candidate for acute therapies, TIA remains in the differential and she is not at baseline.  Would monitor.    Stroke Risk Factors - atrial fibrillation, diabetes mellitus, hyperlipidemia, hypertension, and smoking  Plan: 1. HgbA1c, fasting lipid panel 2. PT consult, OT consult, Speech consult 3. Prophylactic therapy-Continue Plavix 4. Smoking cessation counseling 5. Telemetry monitoring 6. Frequent neuro checks  Case discussed with Dr. Dot Been, MD Neurology  10/21/2022, 1:42 PM

## 2022-10-21 NOTE — ED Notes (Signed)
Spoke with pt's daughter Arnold on the phone and gave her an update.

## 2022-10-21 NOTE — ED Notes (Signed)
Back from MRI.

## 2022-10-21 NOTE — ED Notes (Signed)
To CT on EMS stretcher with RN

## 2022-10-21 NOTE — ED Notes (Signed)
Gave pt a sandwich tray and beverage.

## 2022-10-21 NOTE — Assessment & Plan Note (Signed)
History of seizure-like activity. Was on Keppra and was weaned off by her neurologist due to no confirmed history of seizures and no documented EEG evidence of same.  Also documented that patient had behavioral side effects Patient has been in [place] 300 mg 3 times a day for seizure prevention and neuropathy. Neurology consult about the need to resume antiepileptic therapy

## 2022-10-21 NOTE — ED Notes (Signed)
Daughter Shekinah called and updated that pt will be admitted overnight for observation and that pt was getting an IV dose of keppra as well.

## 2022-10-21 NOTE — Assessment & Plan Note (Addendum)
Maintain consistent carbohydrate diet Hold oral hypoglycemic agent Glycemic control with sliding scale insulin

## 2022-10-21 NOTE — Plan of Care (Signed)

## 2022-10-21 NOTE — Assessment & Plan Note (Addendum)
Patient with a prior history of CVA with right-sided weakness who presents to the ER as a code stroke for sudden onset left-sided weakness to the left facial droop. Initial CT scan of the head without contrast was negative for bleed and showed extensive chronic small vessel ischemic disease with chronic infarcts as above. Symptoms have improved but not completely resolved Patient unable to have a CT angiogram due to contrast allergy and had an MRA of the head and neck which showed high-grade narrowing at the A2 segment of the left ACA. No large vessel occlusion in the head or neck. MRI of the brain showed no acute intracranial process. Sequela of severe chronic microvascular ischemic change with chronic infarcts in the left temporoparietal region and the bilateral cerebellar hemispheres. Continue Plavix and statins. PT/OT/ST consult Patient had a 2D echocardiogram in 04/24 which showed a normal LVEF and no evidence of an interatrial shunt.  Will not repeat 2D echocardiogram at this time. Neurology consult

## 2022-10-21 NOTE — Assessment & Plan Note (Addendum)
Stable Patient appears to be at her baseline mental status.

## 2022-10-21 NOTE — Progress Notes (Signed)
CODE STROKE- PHARMACY COMMUNICATION  Time CODE STROKE called/page received:[id number]  Time response to CODE STROKE was made (in person or via phone): in person  Time Stroke Kit retrieved from Pyxis (only if needed): TNK not given - no acute changes on imaging, only minimal, non-disabling symptoms  Name of Provider/Nurse contacted: Dr. Thad Ranger  Past Medical History:  Diagnosis Date   Acid reflux    Arthritis    Bilateral knees, hands, feet   Asthma    Atrial fibrillation (HCC)    Cancer (HCC)    CHF (congestive heart failure) (HCC)    Diastolic CHF   COPD (chronic obstructive pulmonary disease) (HCC)    Coronary artery disease    Diabetes mellitus without complication (HCC)    Frequent headaches    GI bleed    HLD (hyperlipidemia)    Hypertension    Seizures (HCC)    Skin cancer 2015   Suspected basal cell carcinoma on nose, surgically removed   Stroke (HCC) 04/2017   Thyroid disease    Tremors of nervous system    Prior to Admission medications   Medication Sig Start Date End Date Taking? Authorizing Provider  acetaminophen (TYLENOL) 500 MG tablet Take 500 mg by mouth 3 (three) times daily. 08/08/20   [provider]  albuterol (PROVENTIL HFA;VENTOLIN HFA) 108 (90 Base) MCG/ACT inhaler Inhale 2 puffs into the lungs every 6 (six) hours as needed for wheezing or shortness of breath. 10/08/16   Karamalegos, Netta Neat, DO  atorvastatin (LIPITOR) 10 MG tablet Take 10 mg by mouth daily.    [provider]  Cholecalciferol 50 MCG (2000 UT) TABS Take 2,000 Units by mouth daily.    [provider]  clopidogrel (PLAVIX) 75 MG tablet Take 1 tablet (75 mg total) by mouth daily. 05/11/22   Lurene Shadow, MD  diclofenac Sodium (VOLTAREN) 1 % GEL Apply 2 g topically 3 (three) times daily.    [provider]  DULoxetine (CYMBALTA) 60 MG capsule Take 1 capsule (60 mg total) by mouth daily. 05/11/22   Lurene Shadow, MD  ferrous sulfate 325 (65 FE) MG EC tablet  Take 1 tablet (325 mg total) by mouth daily. 05/11/22   Lurene Shadow, MD  folic acid (FOLVITE) 1 MG tablet Take 1 tablet (1 mg total) by mouth daily. 09/13/19   Kathlen Mody, MD  furosemide (LASIX) 20 MG tablet Take 20 mg by mouth daily.  10/07/18   [provider]  gabapentin (NEURONTIN) 300 MG capsule Take 300 mg by mouth 3 (three) times daily.    [provider]  glipiZIDE-metformin (METAGLIP) 5-500 MG tablet Take 1 tablet by mouth daily. 05/11/22   Lurene Shadow, MD  hydrocortisone cream 1 % Apply 1 Application topically 2 (two) times daily as needed for itching.    [provider]  levETIRAcetam (KEPPRA) 500 MG tablet Take 1 tablet (500 mg total) by mouth 2 (two) times daily. 09/19/15   Katha Hamming, MD  levothyroxine (SYNTHROID) 100 MCG tablet Take 125 mcg by mouth daily before breakfast. 06/16/19   [provider]  lisinopril (ZESTRIL) 10 MG tablet Take 10 mg by mouth daily. 04/18/22   [provider]  Melatonin 10 MG TABS Take 10 mg by mouth at bedtime.    [provider]  nystatin (MYCOSTATIN/NYSTOP) powder Apply 1 Application topically 2 (two) times daily as needed (irritation).    [provider]  omeprazole (PRILOSEC) 40 MG capsule Take 20 mg by mouth daily. 09/02/17  [provider]  ondansetron (ZOFRAN) 4 MG tablet Take 4 mg by mouth every 6 (six) hours as needed for nausea. 04/17/22   [provider]  vitamin B-12 (CYANOCOBALAMIN) 1000 MCG tablet Take 1,000 mcg by mouth daily. Patient not taking: Reported on 05/09/2022    [provider]    Rockwell Alexandria ,PharmD Clinical Pharmacist  10/21/2022  4:01 PM

## 2022-10-21 NOTE — ED Notes (Signed)
Spoke with [facility] and gave them an update that pt will be kept overnight for observation.

## 2022-10-22 DIAGNOSIS — G459 Transient cerebral ischemic attack, unspecified: Secondary | ICD-10-CM | POA: Diagnosis not present

## 2022-10-22 DIAGNOSIS — G629 Polyneuropathy, unspecified: Secondary | ICD-10-CM | POA: Diagnosis not present

## 2022-10-22 LAB — BASIC METABOLIC PANEL
Anion gap: 9 (ref 5–15)
BUN: 14 mg/dL (ref 8–23)
CO2: 24 mmol/L (ref 22–32)
Calcium: 8.7 mg/dL — ABNORMAL LOW (ref 8.9–10.3)
Chloride: 105 mmol/L (ref 98–111)
Creatinine, Ser: 0.77 mg/dL (ref 0.44–1.00)
GFR, Estimated: >60 mL/min (ref >60)
Glucose, Bld: 131 mg/dL — ABNORMAL HIGH (ref 70–99)
Potassium: 4.1 mmol/L (ref 3.5–5.1)
Sodium: 138 mmol/L (ref 135–145)

## 2022-10-22 LAB — LIPID PANEL
Cholesterol: 173 mg/dL (ref 0–200)
HDL: 44 mg/dL (ref >40)
LDL Cholesterol: 94 mg/dL (ref 0–99)
Total CHOL/HDL Ratio: 3.9 {ratio}
Triglycerides: 176 mg/dL — ABNORMAL HIGH (ref <150)
VLDL: 35 mg/dL (ref 0–40)

## 2022-10-22 LAB — GLUCOSE, CAPILLARY
Glucose-Capillary: 130 mg/dL — ABNORMAL HIGH (ref 70–99)
Glucose-Capillary: 132 mg/dL — ABNORMAL HIGH (ref 70–99)

## 2022-10-22 MED ORDER — GABAPENTIN 400 MG PO CAPS: 400.0000 mg | ORAL_CAPSULE | Freq: Three times a day (TID) | ORAL | 0 refills | Status: DC

## 2022-10-22 MED ORDER — LEVETIRACETAM 500 MG PO TABS
500.0000 mg | ORAL_TABLET | Freq: Once | ORAL | Status: AC
  Administered 2022-10-22: 500 mg via ORAL
  Filled 2022-10-22: qty 1

## 2022-10-22 NOTE — NC FL2 (Signed)
[place] MEDICAID FL2 LEVEL OF CARE FORM     IDENTIFICATION  Patient Name: Samantha Aguirre Birthdate: July 13, 1945 Sex: female Admission Date (Current Location): 10/21/2022  [place] and [place] Number:  Chiropodist and Address:  [facility], [address] [place], [place] [address]      Provider Number: [id number]  Attending Physician Name and Address:  Leeroy Bock, MD  Relative Name and Phone Number:       Current Level of Care: Hospital Recommended Level of Care: Assisted Living Facility Prior Approval Number:    Date Approved/Denied:   PASRR Number:    Discharge Plan:      Current Diagnoses: Patient Active Problem List   Diagnosis Date Noted   Neuropathy 10/22/2022   Tobacco abuse counseling 05/09/2022   Fall at nursing home    Vascular dementia without behavioral disturbance (HCC)    Bacteremia    Sepsis with encephalopathy without septic shock (HCC)    AMS (altered mental status) 09/06/2019   Fever 09/06/2019   SIRS (systemic inflammatory response syndrome) (HCC) 09/06/2019   Hypokalemia 09/06/2019   Status post reverse total shoulder replacement, 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   Stroke (HCC) 06/13/2018   Hypotension 03/18/2018   Malnutrition of moderate degree 03/16/2018   Closed fracture of lumbar vertebral body (HCC) 03/15/2018   Speaking difficulty 03/13/2018   TIA (transient ischemic attack) 09/22/2017   PAF (paroxysmal atrial fibrillation) (HCC)    Acute CVA (cerebrovascular accident) (HCC) 07/30/2017   GI bleed 07/08/2017   Anemia 07/08/2017   Angiodysplasia of stomach and duodenum with hemorrhage    CVA (cerebral vascular accident) (HCC) 07/06/2017   Stroke (cerebrum) (HCC)    Goals of care, counseling/discussion    Palliative care encounter    COPD exacerbation (HCC) 04/03/2017   Flu 03/27/2017   Iron deficiency anemia due to chronic blood loss    Cataracts, bilateral 01/15/2017   Chronic venous insufficiency  10/06/2016   Recurrent major depressive disorder, in partial remission (HCC) 10/06/2016   Left-sided weakness 10/03/2016   Chest pain 02/27/2016   History of stroke 02/19/2016   Thumb pain, left 01/22/2016   Traumatic closed nondisplaced fracture of base of metacarpal bone of left thumb 01/22/2016   Colon cancer screening 11/27/2015   Depression 11/14/2015   Coronary artery disease 11/13/2015   Hypertension 11/13/2015   History of skin cancer 11/13/2015   Osteoporosis 11/13/2015   Chronic low back pain 11/13/2015   Left medial knee pain 11/13/2015   Osteoarthritis of multiple joints 11/13/2015   Other specified hypothyroidism 08/16/2015   Type 2 diabetes mellitus with other specified complication (HCC) 08/16/2015   COPD (chronic obstructive pulmonary disease) (HCC) 08/16/2015   Tobacco abuse 08/16/2015   HLD (hyperlipidemia) 08/16/2015   History of CHF (congestive heart failure) 08/16/2015   Seizure (HCC)     Orientation RESPIRATION BLADDER Height & Weight     Self, Time, Situation, Place  Normal Continent Weight: 164 lb 7.4 oz (74.6 kg) Height:  5\' 1"  (154.9 cm)  BEHAVIORAL SYMPTOMS/MOOD NEUROLOGICAL BOWEL NUTRITION STATUS      Continent    AMBULATORY STATUS COMMUNICATION OF NEEDS Skin     Verbally Normal                       Personal Care Assistance Level of Assistance              Functional Limitations Info  SPECIAL CARE FACTORS FREQUENCY  PT (By licensed PT), OT (By licensed OT)     PT Frequency: 3 times a week OT Frequency: 3 times a week            Contractures Contractures Info: Not present    Additional Factors Info  Code Status, Allergies, Isolation Precautions Code Status Info: FULL Allergies Info: Ivp Dye (Iodinated Contrast Media)  Methotrexate  Morphine And Codeine  Mushroom Extract Complex  Atenolol  Percocet (Oxycodone-acetaminophen)  Percodan (Oxycodone-aspirin)  Tramadol  Verapamil  Oxycodone Hcl     Isolation  Precautions Info: MRSA     Current Medications (10/22/2022):  This is the current hospital active medication list Current Facility-Administered Medications  Medication Dose Route Frequency Provider Last Rate Last Admin   acetaminophen (TYLENOL) tablet 500 mg  500 mg Oral TID Agbata, Tochukwu, MD   500 mg at 10/22/22 0957   albuterol (PROVENTIL) (2.5 MG/3ML) 0.083% nebulizer solution 2.5 mg  2.5 mg Inhalation Q6H PRN Agbata, Tochukwu, MD       atorvastatin (LIPITOR) tablet 10 mg  10 mg Oral Daily Agbata, Tochukwu, MD   10 mg at 10/22/22 0958   cholecalciferol (VITAMIN D3) 25 MCG (1000 UNIT) tablet 2,000 Units  2,000 Units Oral Daily Agbata, Tochukwu, MD   2,000 Units at 10/22/22 0957   clopidogrel (PLAVIX) tablet 75 mg  75 mg Oral Daily Agbata, Tochukwu, MD   75 mg at 10/22/22 0956   DULoxetine (CYMBALTA) DR capsule 60 mg  60 mg Oral Daily Agbata, Tochukwu, MD   60 mg at 10/22/22 0956   ferrous sulfate tablet 325 mg  325 mg Oral Daily Agbata, Tochukwu, MD   325 mg at 10/22/22 0957   folic acid (FOLVITE) tablet 1 mg  1 mg Oral Daily Agbata, Tochukwu, MD   1 mg at 10/22/22 0957   furosemide (LASIX) tablet 20 mg  20 mg Oral Daily Agbata, Tochukwu, MD   20 mg at 10/22/22 0957   gabapentin (NEURONTIN) capsule 300 mg  300 mg Oral TID Agbata, Tochukwu, MD   300 mg at 10/22/22 0957   insulin aspart (novoLOG) injection 0-15 Units  0-15 Units Subcutaneous TID WC Agbata, Tochukwu, MD   2 Units at 10/22/22 0754   levothyroxine (SYNTHROID) tablet 125 mcg  125 mcg Oral QAC breakfast Agbata, Tochukwu, MD   125 mcg at 10/22/22 0528   lisinopril (ZESTRIL) tablet 10 mg  10 mg Oral Daily Agbata, Tochukwu, MD   10 mg at 10/22/22 0957   melatonin tablet 10 mg  10 mg Oral QHS Agbata, Tochukwu, MD   10 mg at 10/21/22 2202   pantoprazole (PROTONIX) EC tablet 40 mg  40 mg Oral Daily Agbata, Tochukwu, MD   40 mg at 10/22/22 0957     Discharge Medications: Please see discharge summary for a list of discharge  medications.   Medication List       STOP taking these medications     cyanocobalamin 1000 MCG tablet Commonly known as: VITAMIN B12           TAKE these medications     acetaminophen 500 MG tablet Commonly known as: TYLENOL Take 500 mg by mouth 3 (three) times daily.    albuterol 108 (90 Base) MCG/ACT inhaler Commonly known as: VENTOLIN HFA Inhale 2 puffs into the lungs every 6 (six) hours as needed for wheezing or shortness of breath.    atorvastatin 10 MG tablet Commonly known as: LIPITOR Take 10 mg by mouth daily.  Cholecalciferol 50 MCG (2000 UT) Tabs Take 2,000 Units by mouth daily.    clopidogrel 75 MG tablet Commonly known as: PLAVIX Take 1 tablet (75 mg total) by mouth daily.    DULoxetine 60 MG capsule Commonly known as: CYMBALTA Take 1 capsule (60 mg total) by mouth daily.    ferrous sulfate 325 (65 FE) MG EC tablet Take 1 tablet (325 mg total) by mouth daily.    folic acid 1 MG tablet Commonly known as: FOLVITE Take 1 tablet (1 mg total) by mouth daily.    furosemide 20 MG tablet Commonly known as: LASIX Take 20 mg by mouth daily.    gabapentin 400 MG capsule Commonly known as: NEURONTIN Take 1 capsule (400 mg total) by mouth 3 (three) times daily. What changed:  medication strength how much to take    glipiZIDE-metformin 5-500 MG tablet Commonly known as: METAGLIP Take 1 tablet by mouth daily.    hydrocortisone cream 1 % Apply 1 Application topically 2 (two) times daily as needed for itching.    levETIRAcetam 500 MG tablet Commonly known as: KEPPRA Take 1 tablet (500 mg total) by mouth 2 (two) times daily.    levothyroxine 100 MCG tablet Commonly known as: SYNTHROID Take 125 mcg by mouth daily before breakfast.    lisinopril 10 MG tablet Commonly known as: ZESTRIL Take 10 mg by mouth daily.    Melatonin 10 MG Tabs Take 10 mg by mouth at bedtime.    nystatin powder Commonly known as: MYCOSTATIN/NYSTOP Apply 1 Application  topically 2 (two) times daily as needed (irritation).    omeprazole 40 MG capsule Commonly known as: PRILOSEC Take 20 mg by mouth daily.    ondansetron 4 MG tablet Commonly known as: ZOFRAN Take 4 mg by mouth every 6 (six) hours as needed for nausea.    Voltaren 1 % Gel Generic drug: diclofenac Sodium Apply 2 g topically 3 (three) times daily.       Relevant Imaging Results:  Relevant Lab Results:   Additional Information    Community education officer, LCSW

## 2022-10-22 NOTE — Progress Notes (Signed)
NEUROLOGY CONSULT FOLLOW UP NOTE   Date of service: October 22, 2022 Patient Name: Samantha Aguirre MRN:  [id number] DOB:  January 30, 1945  Brief HPI  Samantha Aguirre is a 77 y.o. female  has a past medical history of Acid reflux, Arthritis, Asthma, Atrial fibrillation (HCC), Cancer (HCC), CHF (congestive heart failure) (HCC), COPD (chronic obstructive pulmonary disease) (HCC), Coronary artery disease, Diabetes mellitus without complication (HCC), Frequent headaches, GI bleed, HLD (hyperlipidemia), Hypertension, Seizures (HCC), Skin cancer (2015), Stroke (HCC) (04/2017), Thyroid disease, and Tremors of nervous system. who presented with complaints of left face and left leg weakness. Has a history of stroke with residual right hemiparesis and right sided visual changes.    Interval Hx/subjective   Patient reports feeling well today.  Reports that she is at baseline. Vitals   Vitals:   10/22/22 0021 10/22/22 0351 10/22/22 0732 10/22/22 0800  BP: (!) 100/48 (!) 112/57 121/70 (!) 126/52  Pulse: (!) 52 (!) 54 (!) 44 (!) 50  Resp: 18 18 19 18   Temp: 98 F (36.7 C) 98.1 F (36.7 C) 98.4 F (36.9 C) 98.8 F (37.1 C)  TempSrc: Oral Oral  Oral  SpO2: 93% 95% 98% 97%  Weight:      Height:         Body mass index is 31.08 kg/m.  Physical Exam   Constitutional: Appears well-developed and well-nourished.  Psych: Affect appropriate to situation.  Eyes: No scleral injection.  Head: Normocephalic.  Cardiovascular: Normal rate and regular rhythm.    Neurologic Examination   Mental Status: Alert, oriented, thought content appropriate.  Speech fluent without evidence of aphasia.  Able to follow 3 step commands without difficulty. Cranial Nerves: II: Right visual field cut III,IV, VI: extra-ocular motions intact bilaterally V,VII: smile symmetric, facial light touch sensation normal bilaterally VIII: hearing normal bilaterally XI: bilateral shoulder shrug XII: midline tongue  extension Motor: 5-/5 left hand grip.  No pronator drift noted.  5-/5 RLE strength.  Patient otherwise intact.   Sensory: Pinprick and light touch intact throughout, bilaterally   Labs and Diagnostic Imaging   CBC:  Recent Labs  Lab 10/21/22 1030  WBC 6.9  NEUTROABS 4.4  HGB 11.9*  HCT 36.9  MCV 92.3  PLT 223    Basic Metabolic Panel:  Lab Results  Component Value Date   NA 138 10/22/2022   K 4.1 10/22/2022   CO2 24 10/22/2022   GLUCOSE 131 (H) 10/22/2022   BUN 14 10/22/2022   CREATININE 0.77 10/22/2022   CALCIUM 8.7 (L) 10/22/2022   GFRNONAA >60 10/22/2022   GFRAA >60 09/12/2019   Lipid Panel:  Lab Results  Component Value Date   LDLCALC 94 10/22/2022   HgbA1c:  Lab Results  Component Value Date   HGBA1C 5.8 (H) 05/09/2022   Urine Drug Screen:     Component Value Date/Time   LABOPIA NONE DETECTED 10/21/2022 1136   LABOPIA NONE DETECTED 09/22/2017 1940   COCAINSCRNUR NONE DETECTED 10/21/2022 1136   LABBENZ NONE DETECTED 10/21/2022 1136   LABBENZ NONE DETECTED 09/22/2017 1940   AMPHETMU NONE DETECTED 10/21/2022 1136   AMPHETMU NONE DETECTED 09/22/2017 1940   THCU NONE DETECTED 10/21/2022 1136   THCU NONE DETECTED 09/22/2017 1940   LABBARB NONE DETECTED 10/21/2022 1136   LABBARB NONE DETECTED 09/22/2017 1940    Alcohol Level     Component Value Date/Time   ETH <10 10/21/2022 1030   INR  Lab Results  Component Value Date   INR 1.0 10/21/2022  APTT  Lab Results  Component Value Date   APTT 27 10/21/2022   AED levels: No results found for: "PHENYTOIN", "ZONISAMIDE", "LAMOTRIGINE", "LEVETIRACETA"  MR Angio head without contrast and Carotid Duplex BL(Personally reviewed): MRA HEAD WITHOUT CONTRAST   MRA NECK WITHOUT AND WITH CONTRAST   TECHNIQUE: Angiographic images of the Circle of Willis were acquired using MRA technique without intravenous contrast. Angiographic images of the neck were acquired using MRA technique without and with  intravenous contrast. Carotid stenosis measurements (when applicable) are obtained utilizing NASCET criteria, using the distal internal carotid diameter as the denominator.   CONTRAST:  9mL GADAVIST GADOBUTROL 1 MMOL/ML IV SOLN   COMPARISON:  None Available.   FINDINGS: MRA HEAD FINDINGS   Anterior circulation: High-grade narrowing of the A2 segment of the left ACA (series 5, image 140).   Posterior circulation: No proximal occlusion. No significant stenosis. No aneurysm.   Anatomic variants: None   MRA NECK FINDINGS   Aortic arch: Three-vessel arch. No dissection. No stenosis. No significant plaque.   Right carotid system: No dissection. No occlusion. No hemodynamically significant stenosis.   Left carotid system: No dissection. No occlusion. No hemodynamically significant stenosis.   Vertebral arteries: Mild narrowing at the origin of bilateral vertebral arteries.   Other: None   IMPRESSION: 1. High-grade narrowing at the A2 segment of the left ACA. 2. No large vessel occlusion in the head or neck.      MRI head  MRI HEAD WITHOUT CONTRAST   TECHNIQUE: Multiplanar, multiecho pulse sequences of the brain and surrounding structures were obtained without intravenous contrast.   COMPARISON:  Brain MR 05/09/22   FINDINGS: Brain: Negative for an acute infarct. No hemorrhage. No hydrocephalus. Extra-axial fluid collection. Unchanged T2 hypointense extra-axial lesion along the falx measuring 8 x 8 mm, likely a meningioma series 9, image 47). Sequela severe chronic microvascular ischemic change with chronic infarcts in the left temporoparietal region and the bilateral cerebellar hemispheres   Vascular: Normal flow voids.   Skull and upper cervical spine: Incompletely imaged   Sinuses/Orbits: No middle ear or mastoid effusion. Paranasal sinuses are grossly clear. Orbits are unremarkable.   Other: None   IMPRESSION: 1. No acute intracranial process. 2.  Sequela of severe chronic microvascular ischemic change with chronic infarcts in the left temporoparietal region and the bilateral cerebellar hemispheres.   Impression   Samantha Aguirre is a 77 y.o. female with a past medical history of Acid reflux, Arthritis, Asthma, Atrial fibrillation (HCC), Cancer (HCC), CHF (congestive heart failure) (HCC), COPD (chronic obstructive pulmonary disease) (HCC), Coronary artery disease, Diabetes mellitus without complication (HCC), Frequent headaches, GI bleed, HLD (hyperlipidemia), Hypertension, Seizures (HCC), Skin cancer (2015), Stroke (HCC) (04/2017), Thyroid disease, and Tremors of nervous system. who presented with complaints of left face and left leg weakness. Has a history of stroke with residual right hemiparesis and right sided visual changes. Today reports that she is at baseline.  MRI of the brain personally reviewed and shows no acute changes.  MRA of the head and neck significant for left ACA high grade narrowing.   Patient with history of seizure and recently taken off of her Keppra due to long term seizure control and possibility of side effects.  Seizure activity has mimicked stroke in the past.  Will adjust anticonvulsant therapy.  Recommendations  Continue Plavix Increase Neurontin to 400mg  TID from 300mg  TID Patient to continue follow up with outpatient neurology ______________________________________________________________________   Thank you for the opportunity to take part in  the care of this patient. If you have any further questions, please contact the neurology consultation team on call. Updated oncall schedule is listed on AMION.  Signed,  Melton Walls

## 2022-10-22 NOTE — TOC Transition Note (Addendum)
Transition of Care [facility]) - CM/SW Discharge Note   Patient Details  Name: Samantha Aguirre MRN: [id number] Date of Birth: 03-16-1945  Transition of [facility] CM/SW Contact:  Allena Katz, LCSW Phone Number: 10/22/2022, 10:27 AM   Clinical Narrative:   Pt admitted from [facility]. Pt has orders to discharge back TOC has not heard from [facility] if they are able to accept. Message left with [facility].   10:37am  CSW spoke with Tammy at [facility] who states pt can come back. Pt active already with [facility]. Cheryl with [facility] notified.     Final next level of care: Assisted Living Barriers to Discharge: Barriers Resolved   Patient Goals and CMS Choice      Discharge Placement                         Discharge Plan and Services Additional resources added to the After Visit Summary for                                       Social Determinants of Health (SDOH) Interventions SDOH Screenings   Food Insecurity: No Food Insecurity (05/09/2022)  Housing: Low Risk  (05/09/2022)  Transportation Needs: No Transportation Needs (05/09/2022)  Utilities: Not At Risk (05/09/2022)  Depression (PHQ2-9): Medium Risk (04/21/2018)  Financial Resource Strain: Medium Risk (04/02/2017)  Physical Activity: Insufficiently Active (05/19/2017)  Social Connections: Moderately Isolated (05/19/2017)  Stress: Stress Concern Present (05/19/2017)  Tobacco Use: High Risk (10/21/2022)     Readmission Risk Interventions     No data to display

## 2022-10-22 NOTE — Code Documentation (Signed)
Stroke Response Nurse Documentation Code Documentation  Ashanti Ratti is a 77 y.o. female arriving to [facility] via [facility] on 10/21/2022 with past medical hx of stroke, seizures, HLD, COPD, HLD, CAD, CHF, HTN, DM, a-fib, GI bleed, seizures, current smoker. On clopidogrel 75 mg daily. Code stroke was activated by EMS.   Patient from assisted living where she was LKW at ~0900 and now complaining of left weakness, numbbess, facial droop. Patient reports she woke up normal 0630. Around 0930, she walked up to staff reporting she had new left sided weakness of the face and leg. Pt has baseline right sided visual changes and weakness.   Stroke team at the bedside on patient arrival. Labs drawn and patient cleared for CT by Dr. Erma Heritage. Patient to CT with team. NIHSS 5, see documentation for details and code stroke times. Patient with right hemianopia, left facial droop, right arm weakness, left decreased sensation, and dysarthria  on exam. The following imaging was completed:  CT Head and MRI. Patient is not a candidate for IV Thrombolytic due to too mild to treat, per MD. Patient is not a candidate for IR due to no LVO on imaging, per MD.   Care Plan: every 30 minute NIHSS and vital signs until outside window at 1300. Swallow screen per order.   Bedside handoff with ED RN Hildred Alamin  Stroke Response RN

## 2022-10-22 NOTE — Progress Notes (Signed)
SLP Cancellation Note  Patient Details Name: Samantha Aguirre MRN: [id number] DOB: 1945/05/22   Cancelled treatment:       Reason Eval/Treat Not Completed: SLP screened, no needs identified, will sign off (Per chart review, stroke work up negative. Per Neurology note, "Alert, oriented, thought content appropriate.  Speech fluent without evidence of aphasia.  Able to follow 3 step commands without difficulty.")  Clyde Canterbury, M.S., CCC-SLP Speech-Language Pathologist [facility] [phone] [name])  Woodroe Chen 10/22/2022, 9:45 AM

## 2022-10-22 NOTE — Evaluation (Signed)
Physical Therapy Evaluation Patient Details Name: Samantha Aguirre MRN: [id number] DOB: 05-31-1945 Today's Date: 10/22/2022  History of Present Illness  77 y/o female presented to ED on 10/21/22 for L facial droop and L LE weakness. MRI negative. PMH: CAD, DM, HTN, stroke with R residual hemiparesis and visual changes, seizures, Afib  Clinical Impression  Patient admitted with the above. PTA, patient lives at [facility] and reports being modI with rollator. Patient with hx of dementia and currently oriented to self with difficulty redirecting at times. Patient functioning at supervision level with RW for ambulation. Patient will benefit from skilled PT services during acute stay to address listed deficits. Patient will benefit from ongoing therapy at discharge to maximize functional independence and safety.         If plan is discharge home, recommend the following: Supervision due to cognitive status   Can travel by private vehicle        Equipment Recommendations None recommended by PT  Recommendations for Other Services       Functional Status Assessment Patient has had a recent decline in their functional status and demonstrates the ability to make significant improvements in function in a reasonable and predictable amount of time.     Precautions / Restrictions Precautions Precautions: Fall Restrictions Weight Bearing Restrictions: No      Mobility  Bed Mobility               General bed mobility comments: In bathroom with OT    Transfers Overall transfer level: Needs assistance Equipment used: Rolling Maximiliano Cromartie (2 wheels), Rollator (4 wheels) Transfers: Sit to/from Stand Sit to Stand: Supervision                Ambulation/Gait Ambulation/Gait assistance: Supervision Gait Distance (Feet): 180 Feet Assistive device: Rolling Audrick Lamoureaux (2 wheels) Gait Pattern/deviations: Step-through pattern, Decreased stride length Gait velocity: decreased         Stairs            Wheelchair Mobility     Tilt Bed    Modified Rankin (Stroke Patients Only)       Balance Overall balance assessment: No apparent balance deficits (not formally assessed)                                           Pertinent Vitals/Pain Pain Assessment Pain Assessment: Faces Faces Pain Scale: No hurt Pain Location: patient reports headache 8/10 but facial expressions appear to be WNL    Home Living Family/patient expects to be discharged to:: Assisted living                 Home Equipment: Rollator (4 wheels) Additional Comments: Pt is a resident at [facility] in [place]    Prior Function Prior Level of Function : Independent/Modified Independent             Mobility Comments: MOD I with amb with rollator, ADLs Comments: MOD I with ADLs     Extremity/Trunk Assessment   Upper Extremity Assessment Upper Extremity Assessment: Defer to OT evaluation    Lower Extremity Assessment Lower Extremity Assessment: Overall WFL for tasks assessed    Cervical / Trunk Assessment Cervical / Trunk Assessment: Normal  Communication   Communication Communication: No apparent difficulties  Cognition Arousal: Alert Behavior During Therapy: WFL for tasks assessed/performed Overall Cognitive Status: History of cognitive impairments - at baseline  General Comments: Oriented to self and difficult to redirect        General Comments      Exercises     Assessment/Plan    PT Assessment Patient needs continued PT services  PT Problem List Decreased strength;Decreased activity tolerance;Decreased balance;Decreased mobility;Decreased cognition;Decreased knowledge of use of DME;Decreased safety awareness;Decreased knowledge of precautions       PT Treatment Interventions DME instruction;Gait training;Functional mobility training;Therapeutic activities;Balance  training;Therapeutic exercise;Patient/family education    PT Goals (Current goals can be found in the Care Plan section)  Acute Rehab PT Goals Patient Stated Goal: did not state PT Goal Formulation: Patient unable to participate in goal setting Time For Goal Achievement: 11/05/22 Potential to Achieve Goals: Good    Frequency Min 1X/week     Co-evaluation               AM-PAC PT "6 Clicks" Mobility  Outcome Measure Help needed turning from your back to your side while in a flat bed without using bedrails?: None Help needed moving from lying on your back to sitting on the side of a flat bed without using bedrails?: A Little Help needed moving to and from a bed to a chair (including a wheelchair)?: A Little Help needed standing up from a chair using your arms (e.g., wheelchair or bedside chair)?: A Little Help needed to walk in hospital room?: A Little Help needed climbing 3-5 steps with a railing? : A Little 6 Click Score: 19    End of Session   Activity Tolerance: Patient tolerated treatment well Patient left: in chair;with call bell/phone within reach;with chair alarm set Nurse Communication: Mobility status PT Visit Diagnosis: Unsteadiness on feet (R26.81);Muscle weakness (generalized) (M62.81)    Time: 5621-3086 PT Time Calculation (min) (ACUTE ONLY): 9 min   Charges:   PT Evaluation $PT Eval Low Complexity: 1 Low   PT General Charges $$ ACUTE PT VISIT: 1 Visit         Maylon Peppers, PT, DPT Physical Therapist - [facility]  [facility]   Aliyha Fornes A Devinne Epstein 10/22/2022, 10:39 AM

## 2022-10-22 NOTE — Discharge Summary (Signed)
Physician Discharge Summary  Patient: Samantha Aguirre ZOX:[id number] DOB: [birth year]   Code Status: Full Code Admit date: 10/21/2022 Discharge date: 10/22/2022 Disposition: Skilled nursing facility, PT, OT, nurse aid, and RN PCP: Pcp, No  Recommendations for Outpatient Follow-up:  Follow up with PCP within 1-2 weeks Regarding general hospital follow up and preventative care Recommend monitoring carotid stenosis progression as appropriate Follow up with cardiology Review monitor findings. Placed for bradycardia Follow up with neurology Regarding stroke prevention, seizure medication monitoring  Discharge Diagnoses:  Principal Problem:   TIA (transient ischemic attack) Active Problems:   Other specified hypothyroidism   Type 2 diabetes mellitus with other specified complication (HCC)   COPD (chronic obstructive pulmonary disease) (HCC)   Seizure (HCC)   Hypertension   Recurrent major depressive disorder, in partial remission (HCC)   PAF (paroxysmal atrial fibrillation) (HCC)   Vascular dementia without behavioral disturbance [facility])   Neuropathy  Brief Hospital Course Summary: Samantha Aguirre is a 77 y.o. female with medical history significant for prior CVA with right-sided hemiparesis, history of TIA in 04/24 treated with dual antiplatelet therapy for 21 days and currently on mono therapy with Plavix,  A-fib not on long-term anticoagulation due to history of GI bleed, history of vascular dementia, seizure disorder, coronary artery disease, diabetes mellitus on oral agents. They presented to the emergency room via EMS for evaluation of sudden onset left-sided weakness and left facial droop which happened at about 9:30 AM. As of today, her symptoms have resolved and she feels back to her baseline.  There was no evidence of acute stroke on brain MRI.  Neurology was consulted and made recommendation to increase her gabapentin to 400mg  TID.  Due to having non-occlusive carotid  imaging but with high-grade narrowing at the A2 segment of the left ACA, I do recommend close monitoring as she is at increased risk of future repeat strokes.  I have restarted her keppra as she has been off of it for an extended time for period of absence of seizures, but as neurology pointed out, sometimes seizure activity can mimic acute stroke. She was loaded with IV keppra while inpatient. No apparent seizure activity during admission. Follow up with neurology outpatient.  Twelve-lead EKG shows sinus rhythm with low voltage QRS. She had bradycardia on cardiac telemetry overnight to 40s HR which resolved while awake to 50s. She was asymptomatic. Cardiology was consulted and reviewed. She was not on contributory medications. They recommended placing a holter monitor and follow up with them in office in about 2 weeks to review the results.   Discharge Condition: Good, improved Recommended discharge diet: Regular healthy diet  Consultations: Cardiology  Neurology   Procedures/Studies: None   Allergies as of 10/22/2022       Reactions   Ivp Dye [iodinated Contrast Media] Itching   Pt injected with IV contrast.  5 min after injection pt complained of itching behind ear and on her abd.   Methotrexate Rash   Morphine And Codeine Other (See Comments)   "stops my breathing" NEAR-RESPIRATORY ARREST   Mushroom Extract Complex Anaphylaxis   Made patient "deathly sick"   Atenolol Other (See Comments)   "MD took me off of it bc it was doing something wrong" Made patient HYPOTENSIVE   Percocet [oxycodone-acetaminophen] Nausea And Vomiting, Other (See Comments)   Stomach pains   Percodan [oxycodone-aspirin] Nausea And Vomiting, Other (See Comments)   Stomach pains   Tramadol Nausea Only   Verapamil Other (See Comments)   "  MD told me this was screwing me up" MAKES PATIENT HYPOTENSIVE   Oxycodone Hcl         Medication List     STOP taking these medications    cyanocobalamin 1000 MCG  tablet Commonly known as: VITAMIN B12       TAKE these medications    acetaminophen 500 MG tablet Commonly known as: TYLENOL Take 500 mg by mouth 3 (three) times daily.   albuterol 108 (90 Base) MCG/ACT inhaler Commonly known as: VENTOLIN HFA Inhale 2 puffs into the lungs every 6 (six) hours as needed for wheezing or shortness of breath.   atorvastatin 10 MG tablet Commonly known as: LIPITOR Take 10 mg by mouth daily.   Cholecalciferol 50 MCG (2000 UT) Tabs Take 2,000 Units by mouth daily.   clopidogrel 75 MG tablet Commonly known as: PLAVIX Take 1 tablet (75 mg total) by mouth daily.   DULoxetine 60 MG capsule Commonly known as: CYMBALTA Take 1 capsule (60 mg total) by mouth daily.   ferrous sulfate 325 (65 FE) MG EC tablet Take 1 tablet (325 mg total) by mouth daily.   folic acid 1 MG tablet Commonly known as: FOLVITE Take 1 tablet (1 mg total) by mouth daily.   furosemide 20 MG tablet Commonly known as: LASIX Take 20 mg by mouth daily.   gabapentin 400 MG capsule Commonly known as: NEURONTIN Take 1 capsule (400 mg total) by mouth 3 (three) times daily. What changed:  medication strength how much to take   glipiZIDE-metformin 5-500 MG tablet Commonly known as: METAGLIP Take 1 tablet by mouth daily.   hydrocortisone cream 1 % Apply 1 Application topically 2 (two) times daily as needed for itching.   levETIRAcetam 500 MG tablet Commonly known as: KEPPRA Take 1 tablet (500 mg total) by mouth 2 (two) times daily.   levothyroxine 100 MCG tablet Commonly known as: SYNTHROID Take 125 mcg by mouth daily before breakfast.   lisinopril 10 MG tablet Commonly known as: ZESTRIL Take 10 mg by mouth daily.   Melatonin 10 MG Tabs Take 10 mg by mouth at bedtime.   nystatin powder Commonly known as: MYCOSTATIN/NYSTOP Apply 1 Application topically 2 (two) times daily as needed (irritation).   omeprazole 40 MG capsule Commonly known as: PRILOSEC Take 20 mg  by mouth daily.   ondansetron 4 MG tablet Commonly known as: ZOFRAN Take 4 mg by mouth every 6 (six) hours as needed for nausea.   Voltaren 1 % Gel Generic drug: diclofenac Sodium Apply 2 g topically 3 (three) times daily.        Follow-up Information     PCP. Schedule an appointment as soon as possible for a visit in 1 week(s).                 Subjective   Pt reports feeling back to her baseline. She is feeling well to discharge back home today. Has no complaints of lightheadedness when standing. No new weakness or numbness from baseline.   All questions and concerns were addressed at time of discharge.  Objective  Blood pressure (!) 126/52, pulse (!) 50, temperature 98.8 F (37.1 C), temperature source Oral, resp. rate 18, height 5\' 1"  (1.549 m), weight 74.6 kg, SpO2 97%.   General: Pt is alert, awake, not in acute distress Cardiovascular: RRR, S1/S2 +, no rubs, no gallops Respiratory: CTA bilaterally, no wheezing, no rhonchi Abdominal: Soft, NT, ND, bowel sounds + Extremities: no edema, no cyanosis. Stable R weakness  from prior stroke.   The results of significant diagnostics from this hospitalization (including imaging, microbiology, ancillary and laboratory) are listed below for reference.   Imaging studies: MR ANGIO HEAD WO CONTRAST  Result Date: 10/21/2022 CLINICAL DATA:  Stroke suspected EXAM: MRA HEAD WITHOUT CONTRAST MRA NECK WITHOUT AND WITH CONTRAST TECHNIQUE: Angiographic images of the Circle of Willis were acquired using MRA technique without intravenous contrast. Angiographic images of the neck were acquired using MRA technique without and with intravenous contrast. Carotid stenosis measurements (when applicable) are obtained utilizing NASCET criteria, using the distal internal carotid diameter as the denominator. CONTRAST:  9mL GADAVIST GADOBUTROL 1 MMOL/ML IV SOLN COMPARISON:  None Available. FINDINGS: MRA HEAD FINDINGS Anterior circulation: High-grade  narrowing of the A2 segment of the left ACA (series 5, image 140). Posterior circulation: No proximal occlusion. No significant stenosis. No aneurysm. Anatomic variants: None MRA NECK FINDINGS Aortic arch: Three-vessel arch. No dissection. No stenosis. No significant plaque. Right carotid system: No dissection. No occlusion. No hemodynamically significant stenosis. Left carotid system: No dissection. No occlusion. No hemodynamically significant stenosis. Vertebral arteries: Mild narrowing at the origin of bilateral vertebral arteries. Other: None IMPRESSION: 1. High-grade narrowing at the A2 segment of the left ACA. 2. No large vessel occlusion in the head or neck. Electronically Signed   By: Lorenza Cambridge M.D.   On: 10/21/2022 12:50   MR ANGIO NECK W WO CONTRAST  Result Date: 10/21/2022 CLINICAL DATA:  Stroke suspected EXAM: MRA HEAD WITHOUT CONTRAST MRA NECK WITHOUT AND WITH CONTRAST TECHNIQUE: Angiographic images of the Circle of Willis were acquired using MRA technique without intravenous contrast. Angiographic images of the neck were acquired using MRA technique without and with intravenous contrast. Carotid stenosis measurements (when applicable) are obtained utilizing NASCET criteria, using the distal internal carotid diameter as the denominator. CONTRAST:  9mL GADAVIST GADOBUTROL 1 MMOL/ML IV SOLN COMPARISON:  None Available. FINDINGS: MRA HEAD FINDINGS Anterior circulation: High-grade narrowing of the A2 segment of the left ACA (series 5, image 140). Posterior circulation: No proximal occlusion. No significant stenosis. No aneurysm. Anatomic variants: None MRA NECK FINDINGS Aortic arch: Three-vessel arch. No dissection. No stenosis. No significant plaque. Right carotid system: No dissection. No occlusion. No hemodynamically significant stenosis. Left carotid system: No dissection. No occlusion. No hemodynamically significant stenosis. Vertebral arteries: Mild narrowing at the origin of bilateral  vertebral arteries. Other: None IMPRESSION: 1. High-grade narrowing at the A2 segment of the left ACA. 2. No large vessel occlusion in the head or neck. Electronically Signed   By: Lorenza Cambridge M.D.   On: 10/21/2022 12:50   [name]  Result Date: 10/21/2022 CLINICAL DATA:  Stroke, follow up EXAM: MRI HEAD WITHOUT CONTRAST TECHNIQUE: Multiplanar, multiecho pulse sequences of the brain and surrounding structures were obtained without intravenous contrast. COMPARISON:  Brain MR 05/09/22 FINDINGS: Brain: Negative for an acute infarct. No hemorrhage. No hydrocephalus. Extra-axial fluid collection. Unchanged T2 hypointense extra-axial lesion along the falx measuring 8 x 8 mm, likely a meningioma series 9, image 47). Sequela severe chronic microvascular ischemic change with chronic infarcts in the left temporoparietal region and the bilateral cerebellar hemispheres Vascular: Normal flow voids. Skull and upper cervical spine: Incompletely imaged Sinuses/Orbits: No middle ear or mastoid effusion. Paranasal sinuses are grossly clear. Orbits are unremarkable. Other: None IMPRESSION: 1. No acute intracranial process. 2. Sequela of severe chronic microvascular ischemic change with chronic infarcts in the left temporoparietal region and the bilateral cerebellar hemispheres. Electronically Signed   By:  Lorenza Cambridge M.D.   On: 10/21/2022 12:40   CT HEAD CODE STROKE WO CONTRAST  Result Date: 10/21/2022 CLINICAL DATA:  Code stroke. Neuro deficit, acute, stroke suspected. Left sided weakness. EXAM: CT HEAD WITHOUT CONTRAST TECHNIQUE: Contiguous axial images were obtained from the base of the skull through the vertex without intravenous contrast. RADIATION DOSE REDUCTION: This exam was performed according to the departmental dose-optimization program which includes automated exposure control, adjustment of the mA and/or kV according to patient size and/or use of iterative reconstruction technique. COMPARISON:  Head  CT and MRI 05/09/2022 FINDINGS: Brain: There is no evidence of an acute infarct, intracranial hemorrhage, midline shift, or extra-axial fluid collection. A moderate-sized chronic infarct in the posterior left cerebral hemisphere and small chronic bilateral cerebellar infarcts are unchanged. There is a background of extensive chronic small vessel ischemic disease in the cerebral white matter. Vascular: Calcified atherosclerosis at the skull base. No hyperdense vessel. Skull: No acute fracture or suspicious osseous lesion. Sinuses/Orbits: Opacification of a mid right ethmoid air cell. Clear mastoid air cells. Unremarkable orbits. Other: None. ASPECTS ([place] Stroke Program Early CT Score) - Ganglionic level infarction (caudate, lentiform nuclei, internal capsule, insula, M1-M3 cortex): 7 - Supraganglionic infarction (M4-M6 cortex): 3 Total score (0-10 with 10 being normal): 10 These results were communicated to Dr. Thad Ranger at 11:04 am on 10/21/2022 by text page via the [facility] messaging system. IMPRESSION: 1. No evidence of acute intracranial abnormality. ASPECTS of 10. 2. Extensive chronic small vessel ischemic disease with chronic infarcts as above. Electronically Signed   By: Sebastian Ache M.D.   On: 10/21/2022 11:05    Labs: Basic Metabolic Panel: Recent Labs  Lab 10/21/22 1030 10/22/22 0500  NA 139 138  K 3.6 4.1  CL 105 105  CO2 25 24  GLUCOSE 130* 131*  BUN 18 14  CREATININE 0.76 0.77  CALCIUM 8.6* 8.7*   CBC: Recent Labs  Lab 10/21/22 1030  WBC 6.9  NEUTROABS 4.4  HGB 11.9*  HCT 36.9  MCV 92.3  PLT 223   Microbiology: Results for orders placed or performed during the hospital encounter of 09/06/19  Blood Culture (routine x 2)     Status: Abnormal   Collection Time: 09/06/19  4:17 PM   Specimen: BLOOD LEFT FOREARM  Result Value Ref Range Status   Specimen Description BLOOD LEFT FOREARM  Final   Special Requests   Final    BOTTLES DRAWN AEROBIC AND ANAEROBIC Blood Culture  adequate volume   Culture  Setup Time   Final    GRAM NEGATIVE RODS IN BOTH AEROBIC AND ANAEROBIC BOTTLES Organism ID to follow CRITICAL RESULT CALLED TO, READ BACK BY AND VERIFIED WITH: Roselie Skinner PharmD 9:10 09/07/19 ([email or web address]) Performed at [facility], [address], [place], [place] [address]    Culture ENTEROBACTER AEROGENES (A)  Final   Report Status 09/09/2019 FINAL  Final   Organism ID, Bacteria ENTEROBACTER AEROGENES  Final      Susceptibility   Enterobacter aerogenes - MIC*    CEFAZOLIN >=64 RESISTANT Resistant     CEFEPIME <=0.12 SENSITIVE Sensitive     CEFTAZIDIME <=1 SENSITIVE Sensitive     CEFTRIAXONE <=0.25 SENSITIVE Sensitive     CIPROFLOXACIN <=0.25 SENSITIVE Sensitive     GENTAMICIN <=1 SENSITIVE Sensitive     IMIPENEM 2 SENSITIVE Sensitive     TRIMETH/SULFA <=20 SENSITIVE Sensitive     PIP/TAZO <=4 SENSITIVE Sensitive     * ENTEROBACTER AEROGENES  Blood Culture  ID Panel (Reflexed)     Status: Abnormal   Collection Time: 09/06/19  4:17 PM  Result Value Ref Range Status   Enterococcus faecalis NOT DETECTED NOT DETECTED Final   Enterococcus Faecium NOT DETECTED NOT DETECTED Final   Listeria monocytogenes NOT DETECTED NOT DETECTED Final   Staphylococcus species NOT DETECTED NOT DETECTED Final   Staphylococcus aureus (BCID) NOT DETECTED NOT DETECTED Final   Staphylococcus epidermidis NOT DETECTED NOT DETECTED Final   Staphylococcus lugdunensis NOT DETECTED NOT DETECTED Final   Streptococcus species NOT DETECTED NOT DETECTED Final   Streptococcus agalactiae NOT DETECTED NOT DETECTED Final   Streptococcus pneumoniae NOT DETECTED NOT DETECTED Final   Streptococcus pyogenes NOT DETECTED NOT DETECTED Final   A.calcoaceticus-baumannii NOT DETECTED NOT DETECTED Final   Bacteroides fragilis NOT DETECTED NOT DETECTED Final   Enterobacterales DETECTED (A) NOT DETECTED Final    Comment: Enterobacterales represent a large order of gram negative bacteria, not a single  organism. CRITICAL RESULT CALLED TO, READ BACK BY AND VERIFIED WITH: Roselie Skinner PharmD 9:10 09/07/19 ([email or web address])    Enterobacter cloacae complex NOT DETECTED NOT DETECTED Final   Escherichia coli NOT DETECTED NOT DETECTED Final   Klebsiella aerogenes DETECTED (A) NOT DETECTED Final    Comment: CRITICAL RESULT CALLED TO, READ BACK BY AND VERIFIED WITH: Roselie Skinner PharmD 9:10 09/07/19 ([email or web address])    Klebsiella oxytoca NOT DETECTED NOT DETECTED Final   Klebsiella pneumoniae NOT DETECTED NOT DETECTED Final   Proteus species NOT DETECTED NOT DETECTED Final   Salmonella species NOT DETECTED NOT DETECTED Final   Serratia marcescens NOT DETECTED NOT DETECTED Final   Haemophilus influenzae NOT DETECTED NOT DETECTED Final   Neisseria meningitidis NOT DETECTED NOT DETECTED Final   Pseudomonas aeruginosa NOT DETECTED NOT DETECTED Final   Stenotrophomonas maltophilia NOT DETECTED NOT DETECTED Final   Candida albicans NOT DETECTED NOT DETECTED Final   Candida auris NOT DETECTED NOT DETECTED Final   Candida glabrata NOT DETECTED NOT DETECTED Final   Candida krusei NOT DETECTED NOT DETECTED Final   Candida parapsilosis NOT DETECTED NOT DETECTED Final   Candida tropicalis NOT DETECTED NOT DETECTED Final   Cryptococcus neoformans/gattii NOT DETECTED NOT DETECTED Final   CTX-M ESBL NOT DETECTED NOT DETECTED Final   Carbapenem resistance IMP NOT DETECTED NOT DETECTED Final   Carbapenem resistance KPC NOT DETECTED NOT DETECTED Final   Carbapenem resistance NDM NOT DETECTED NOT DETECTED Final   Carbapenem resist OXA 48 LIKE NOT DETECTED NOT DETECTED Final   Carbapenem resistance VIM NOT DETECTED NOT DETECTED Final    Comment: Performed at [facility], [address] [place], [place] [address]  Urine culture     Status: None   Collection Time: 09/06/19  4:18 PM   Specimen: In/Out Cath Urine  Result Value Ref Range Status   Specimen Description IN/OUT CATH URINE  Final   Special Requests NONE  Final    Culture   Final    NO GROWTH Performed at [facility], [address], [place], [place] [address]    Report Status 09/07/2019 FINAL  Final  SARS Coronavirus 2 by RT PCR (hospital order, performed in [facility] hospital lab) Nasopharyngeal Urine, Catheterized     Status: None   Collection Time: 09/06/19  4:18 PM   Specimen: Urine, Catheterized; Nasopharyngeal  Result Value Ref Range Status   SARS Coronavirus 2 NEGATIVE NEGATIVE Final    Comment: (NOTE) SARS-CoV-2 target nucleic acids are NOT DETECTED.  The SARS-CoV-2  RNA is generally detectable in upper and lower respiratory specimens during the acute phase of infection. The lowest concentration of SARS-CoV-2 viral copies this assay can detect is 250 copies / mL. A negative result does not preclude SARS-CoV-2 infection and should not be used as the sole basis for treatment or other patient management decisions.  A negative result may occur with improper specimen collection / handling, submission of specimen other than nasopharyngeal swab, presence of viral mutation(s) within the areas targeted by this assay, and inadequate number of viral copies (<250 copies / mL). A negative result must be combined with clinical observations, patient history, and epidemiological information.  Fact Sheet for Patients:   BoilerBrush.com.cy  Fact Sheet for Healthcare Providers: https://pope.com/  This test is not yet approved or  cleared by the [place] FDA and has been authorized for detection and/or diagnosis of SARS-CoV-2 by FDA under an Emergency Use Authorization (EUA).  This EUA will remain in effect (meaning this test can be used) for the duration of the COVID-19 declaration under Section 564(b)(1) of the Act, 21 U.S.C. section 360bbb-3(b)(1), unless the authorization is terminated or revoked sooner.  Performed at [facility], [address], [place], [place] [address]    Blood Culture (routine x 2)     Status: Abnormal   Collection Time: 09/06/19  4:21 PM   Specimen: BLOOD  Result Value Ref Range Status   Specimen Description BLOOD LEFT UPPER ARM  Final   Special Requests   Final    BOTTLES DRAWN AEROBIC AND ANAEROBIC Blood Culture adequate volume   Culture  Setup Time   Final    GRAM NEGATIVE RODS IN BOTH AEROBIC AND ANAEROBIC BOTTLES CRITICAL VALUE NOTED.  VALUE IS CONSISTENT WITH PREVIOUSLY REPORTED AND CALLED VALUE.    Culture (A)  Final    ENTEROBACTER AEROGENES SUSCEPTIBILITIES PERFORMED ON PREVIOUS CULTURE WITHIN THE LAST 5 DAYS. Performed at [facility], [address], [place], [place] [address]    Report Status 09/09/2019 FINAL  Final  Culture, blood (routine x 2)     Status: None   Collection Time: 09/09/19  7:40 AM   Specimen: BLOOD  Result Value Ref Range Status   Specimen Description BLOOD BLOOD LEFT HAND  Final   Special Requests   Final    BOTTLES DRAWN AEROBIC AND ANAEROBIC Blood Culture adequate volume   Culture   Final    NO GROWTH 5 DAYS Performed at [facility], [address], [place], [place] [address]    Report Status 09/14/2019 FINAL  Final  Culture, blood (routine x 2)     Status: None   Collection Time: 09/09/19  7:40 AM   Specimen: BLOOD  Result Value Ref Range Status   Specimen Description BLOOD LEFT ANTECUBITAL  Final   Special Requests   Final    BOTTLES DRAWN AEROBIC AND ANAEROBIC Blood Culture adequate volume   Culture   Final    NO GROWTH 5 DAYS Performed at [facility], [address], [place], [place] [address]    Report Status 09/14/2019 FINAL  Final  SARS Coronavirus 2 by RT PCR (hospital order, performed in [facility] hospital lab) Nasopharyngeal Nasopharyngeal Swab     Status: None   Collection Time: 09/12/19  1:15 PM   Specimen: Nasopharyngeal Swab  Result Value Ref Range Status   SARS Coronavirus 2 NEGATIVE NEGATIVE Final    Comment: (NOTE) SARS-CoV-2 target nucleic  acids are NOT DETECTED.  The SARS-CoV-2 RNA is generally detectable in upper and lower respiratory specimens during the acute phase of infection. The lowest concentration of SARS-CoV-2 viral copies this assay can detect is 250 copies / mL. A negative result does not preclude SARS-CoV-2 infection and should not be used as the sole basis for treatment or other patient management decisions.  A negative result may occur with improper specimen collection / handling, submission of specimen other than nasopharyngeal swab, presence of viral mutation(s) within the areas targeted by this assay, and inadequate number of viral copies (<250 copies / mL). A negative result must be combined with clinical observations, patient history, and epidemiological information.  Fact Sheet for Patients:   BoilerBrush.com.cy  Fact Sheet for Healthcare Providers: https://pope.com/  This test is not yet approved or  cleared by the [place] FDA and has been authorized for detection and/or diagnosis of SARS-CoV-2 by FDA under an Emergency Use Authorization (EUA).  This EUA will remain in effect (meaning this test can be used) for the duration of the COVID-19 declaration under Section 564(b)(1) of the Act, 21 U.S.C. section 360bbb-3(b)(1), unless the authorization is terminated or revoked sooner.  Performed at [facility], [address], [place], [place] [address]    Time coordinating discharge: Over 30 minutes  Leeroy Bock, MD  [facility] 10/22/2022, 10:33 AM

## 2022-10-22 NOTE — Discharge Instructions (Addendum)
Your workup showed no new stroke Please follow up with your primary doctor in about a week.  Neurology has increased your gabapentin dose and a new prescription was sent to your pharmacy Follow up with cardiology in 2 weeks

## 2022-10-22 NOTE — Consult Note (Signed)
[facility] CLINIC CARDIOLOGY CONSULT NOTE       Patient ID: Samantha Aguirre MRN: [id number] DOB/AGE: 15-Mar-1945 77 y.o.  Admit date: 10/21/2022 Referring Physician Dr. Joylene Igo Primary Physician Dr. Marcello Fennel Primary Cardiologist Dr. Juliann Pares (last seen 2021) Reason for Consultation bradycardia  HPI: Samantha Aguirre is a 77 y.o. female  with a past medical history of chronic diastolic CHF, atrial fibrillation (s/p ablation), CAD, remote history of STEMI s/p PCI/stent x3, TIA, CVA (right side residual weakness) DM type II, seizures, COPD, hypertension, hyperlipidemia, anemia  who presented to the ED on 10/21/2022 for left sided weakness and left sided facial droop.  Noted to be bradycardic on telemetry.  Cardiology was consulted for further evaluation.   Majority of the history is obtained via chart review due to the patient's baseline dementia.  Patient was brought into the ED on 10/8 due to concerns for left-sided weakness and left-sided facial droop.  Patient has a significant history of multiple TIAs and prior CVA.  She was reportedly in her usual state of health early yesterday morning and then developed left-sided weakness as well as left-sided facial droop.  Workup in the ED with CT and MRI did not reveal any acute stroke.  Lab work notable for creatinine 0.76, potassium 3.6, hemoglobin 11.9.  Her strokelike symptoms persisted throughout her time in the ED.  She was admitted for observation.  At the time of my evaluation this morning patient is resting comfortably in hospital bed.  She denies any recent issues.  Denies any chest pain, shortness of breath, palpitations.  States that she occasionally has episodes of dizziness but these are not significantly bothersome.  She denies any episodes of syncope.  Noted from chart review that she has a history of atrial fibrillation but is not on any chronic anticoagulation given history of GI bleed.  Review of telemetry demonstrates heart rate in the 40s to  50s overnight but improving throughout the day.  Review of systems complete and found to be negative unless listed above    Past Medical History:  Diagnosis Date   Acid reflux    Arthritis    Bilateral knees, hands, feet   Asthma    Atrial fibrillation (HCC)    Cancer (HCC)    CHF (congestive heart failure) (HCC)    Diastolic CHF   COPD (chronic obstructive pulmonary disease) (HCC)    Coronary artery disease    Diabetes mellitus without complication (HCC)    Frequent headaches    GI bleed    HLD (hyperlipidemia)    Hypertension    Seizures (HCC)    Skin cancer 2015   Suspected basal cell carcinoma on nose, surgically removed   Stroke (HCC) 04/2017   Thyroid disease    Tremors of nervous system     Past Surgical History:  Procedure Laterality Date   ABDOMINAL HYSTERECTOMY  1976   APPENDECTOMY  1980   BACK SURGERY     cardiac stents     CAROTID ENDARTERECTOMY     CHOLECYSTECTOMY  1980   COLONOSCOPY N/A 02/20/2017   Procedure: COLONOSCOPY;  Surgeon: Toney Reil, MD;  Location: [facility] ENDOSCOPY;  Service: Gastroenterology;  Laterality: N/A;   ESOPHAGOGASTRODUODENOSCOPY N/A 02/20/2017   Procedure: ESOPHAGOGASTRODUODENOSCOPY (EGD);  Surgeon: Toney Reil, MD;  Location: [facility] ENDOSCOPY;  Service: Gastroenterology;  Laterality: N/A;   ESOPHAGOGASTRODUODENOSCOPY (EGD) WITH PROPOFOL N/A 07/08/2017   Procedure: ESOPHAGOGASTRODUODENOSCOPY (EGD) WITH PROPOFOL;  Surgeon: Midge Minium, MD;  Location: [facility] ENDOSCOPY;  Service: Endoscopy;  Laterality: N/A;  GALLBLADDER SURGERY  1980   GIVENS CAPSULE STUDY N/A 07/09/2017   Procedure: GIVENS CAPSULE STUDY;  Surgeon: Wyline Mood, MD;  Location: [facility] ENDOSCOPY;  Service: Gastroenterology;  Laterality: N/A;   KNEE SURGERY     left   KYPHOPLASTY N/A 03/16/2018   Procedure: KYPHOPLASTY, L5;  Surgeon: Kennedy Bucker, MD;  Location: [facility] ORS;  Service: Orthopedics;  Laterality: N/A;   REVERSE SHOULDER ARTHROPLASTY Right 08/25/2019    Procedure: REVERSE SHOULDER ARTHROPLASTY;  Surgeon: Christena Flake, MD;  Location: [facility] ORS;  Service: Orthopedics;  Laterality: Right;   ROTATOR CUFF REPAIR     right   TONSILLECTOMY  1950    Medications Prior to Admission  Medication Sig Dispense Refill Last Dose   acetaminophen (TYLENOL) 500 MG tablet Take 500 mg by mouth 3 (three) times daily.      albuterol (PROVENTIL HFA;VENTOLIN HFA) 108 (90 Base) MCG/ACT inhaler Inhale 2 puffs into the lungs every 6 (six) hours as needed for wheezing or shortness of breath. 3 Inhaler 1    atorvastatin (LIPITOR) 10 MG tablet Take 10 mg by mouth daily.      Cholecalciferol 50 MCG (2000 UT) TABS Take 2,000 Units by mouth daily.      clopidogrel (PLAVIX) 75 MG tablet Take 1 tablet (75 mg total) by mouth daily. 30 tablet 0    diclofenac Sodium (VOLTAREN) 1 % GEL Apply 2 g topically 3 (three) times daily.      DULoxetine (CYMBALTA) 60 MG capsule Take 1 capsule (60 mg total) by mouth daily.      ferrous sulfate 325 (65 FE) MG EC tablet Take 1 tablet (325 mg total) by mouth daily.      folic acid (FOLVITE) 1 MG tablet Take 1 tablet (1 mg total) by mouth daily.      furosemide (LASIX) 20 MG tablet Take 20 mg by mouth daily.       glipiZIDE-metformin (METAGLIP) 5-500 MG tablet Take 1 tablet by mouth daily.      hydrocortisone cream 1 % Apply 1 Application topically 2 (two) times daily as needed for itching.      levETIRAcetam (KEPPRA) 500 MG tablet Take 1 tablet (500 mg total) by mouth 2 (two) times daily. 60 tablet 0    levothyroxine (SYNTHROID) 100 MCG tablet Take 125 mcg by mouth daily before breakfast.      lisinopril (ZESTRIL) 10 MG tablet Take 10 mg by mouth daily.      Melatonin 10 MG TABS Take 10 mg by mouth at bedtime.      nystatin (MYCOSTATIN/NYSTOP) powder Apply 1 Application topically 2 (two) times daily as needed (irritation).      omeprazole (PRILOSEC) 40 MG capsule Take 20 mg by mouth daily.  3    ondansetron (ZOFRAN) 4 MG tablet Take 4 mg by  mouth every 6 (six) hours as needed for nausea.      vitamin B-12 (CYANOCOBALAMIN) 1000 MCG tablet Take 1,000 mcg by mouth daily. (Patient not taking: Reported on 05/09/2022)      [DISCONTINUED] gabapentin (NEURONTIN) 300 MG capsule Take 300 mg by mouth 3 (three) times daily.      Social History   Socioeconomic History   Marital status: Divorced    Spouse name: Not on file   Number of children: 2   Years of education: 12   Highest education level: 12th grade  Occupational History   Occupation: Disabled  Tobacco Use   Smoking status: Every Day    Current packs/day:  1.00    Average packs/day: 1 pack/day for 53.0 years (53.0 ttl pk-yrs)    Types: Cigarettes   Smokeless tobacco: Never  Vaping Use   Vaping status: Former  Substance and Sexual Activity   Alcohol use: No   Drug use: No   Sexual activity: Not Currently  Other Topics Concern   Not on file  Social History Narrative   None   Social Determinants of Health   Financial Resource Strain: Medium Risk (04/02/2017)   Overall Financial Resource Strain (CARDIA)    Difficulty of Paying Living Expenses: Somewhat hard  Food Insecurity: No Food Insecurity (05/09/2022)   Hunger Vital Sign    Worried About Running Out of Food in the Last Year: Never true    Ran Out of Food in the Last Year: Never true  Transportation Needs: No Transportation Needs (05/09/2022)   PRAPARE - Administrator, Civil Service (Medical): No    Lack of Transportation (Non-Medical): No  Physical Activity: Insufficiently Active (05/19/2017)   Exercise Vital Sign    Days of Exercise per Week: 2 days    Minutes of Exercise per Session: 20 min  Stress: Stress Concern Present (05/19/2017)   [employer] of Occupational Health - Occupational Stress Questionnaire    Feeling of Stress : Rather much  Social Connections: Moderately Isolated (05/19/2017)   Social Connection and Isolation Panel [NHANES]    Frequency of Communication with Friends and Family:  More than three times a week    Frequency of Social Gatherings with Friends and Family: More than three times a week    Attends Religious Services: Never    Database administrator or Organizations: No    Attends Banker Meetings: Never    Marital Status: Divorced  Catering manager Violence: Not At Risk (05/09/2022)   Humiliation, Afraid, Rape, and Kick questionnaire    Fear of Current or Ex-Partner: No    Emotionally Abused: No    Physically Abused: No    Sexually Abused: No    Family History  Problem Relation Age of Onset   Breast cancer Mother    Heart disease Mother    Stroke Mother    Cancer Mother    COPD Mother    Diabetes Mother    Heart disease Father    Diabetes Father    Stroke Father    Alcohol abuse Sister    Drug abuse Sister    Stroke Sister    Cancer Sister    Mental illness Sister    Heart disease Brother    Arthritis Brother    Diabetes Brother    Heart disease Maternal Grandfather    Heart disease Paternal Grandfather      Vitals:   10/22/22 0021 10/22/22 0351 10/22/22 0732 10/22/22 0800  BP: (!) 100/48 (!) 112/57 121/70 (!) 126/52  Pulse: (!) 52 (!) 54 (!) 44 (!) 50  Resp: 18 18 19 18   Temp: 98 F (36.7 C) 98.1 F (36.7 C) 98.4 F (36.9 C) 98.8 F (37.1 C)  TempSrc: Oral Oral  Oral  SpO2: 93% 95% 98% 97%  Weight:      Height:        PHYSICAL EXAM General: Chronically ill-appearing elderly female, well nourished, in no acute distress sitting upright in hospital bed. HEENT: Normocephalic and atraumatic. Neck: No JVD.  Lungs: Normal respiratory effort on room air. Clear bilaterally to auscultation. No wheezes, crackles, rhonchi.  Heart: HRRR. Normal S1 and S2 without gallops  or murmurs.  Abdomen: Non-distended appearing.  Msk: Normal strength and tone for age. Extremities: Warm and well perfused. No clubbing, cyanosis.  No edema.  Neuro: Alert and oriented X 3. Psych: Answers questions appropriately.   Labs: Basic Metabolic  Panel: Recent Labs    10/21/22 1030 10/22/22 0500  NA 139 138  K 3.6 4.1  CL 105 105  CO2 25 24  GLUCOSE 130* 131*  BUN 18 14  CREATININE 0.76 0.77  CALCIUM 8.6* 8.7*   Liver Function Tests: Recent Labs    10/21/22 1030  AST 13*  ALT 9  ALKPHOS 101  BILITOT 0.6  PROT 6.6  ALBUMIN 3.6   No results for input(s): "LIPASE", "AMYLASE" in the last 72 hours. CBC: Recent Labs    10/21/22 1030  WBC 6.9  NEUTROABS 4.4  HGB 11.9*  HCT 36.9  MCV 92.3  PLT 223   Cardiac Enzymes: No results for input(s): "CKTOTAL", "CKMB", "CKMBINDEX", "TROPONINIHS" in the last 72 hours. BNP: No results for input(s): "BNP" in the last 72 hours. D-Dimer: No results for input(s): "DDIMER" in the last 72 hours. Hemoglobin A1C: No results for input(s): "HGBA1C" in the last 72 hours. Fasting Lipid Panel: Recent Labs    10/22/22 0500  CHOL 173  HDL 44  LDLCALC 94  TRIG 176*  CHOLHDL 3.9   Thyroid Function Tests: Recent Labs    10/21/22 1752  TSH 1.443   Anemia Panel: No results for input(s): "VITAMINB12", "FOLATE", "FERRITIN", "TIBC", "IRON", "RETICCTPCT" in the last 72 hours.   Radiology: MR ANGIO HEAD WO CONTRAST  Result Date: 10/21/2022 CLINICAL DATA:  Stroke suspected EXAM: MRA HEAD WITHOUT CONTRAST MRA NECK WITHOUT AND WITH CONTRAST TECHNIQUE: Angiographic images of the Circle of Willis were acquired using MRA technique without intravenous contrast. Angiographic images of the neck were acquired using MRA technique without and with intravenous contrast. Carotid stenosis measurements (when applicable) are obtained utilizing NASCET criteria, using the distal internal carotid diameter as the denominator. CONTRAST:  9mL GADAVIST GADOBUTROL 1 MMOL/ML IV SOLN COMPARISON:  None Available. FINDINGS: MRA HEAD FINDINGS Anterior circulation: High-grade narrowing of the A2 segment of the left ACA (series 5, image 140). Posterior circulation: No proximal occlusion. No significant stenosis. No  aneurysm. Anatomic variants: None MRA NECK FINDINGS Aortic arch: Three-vessel arch. No dissection. No stenosis. No significant plaque. Right carotid system: No dissection. No occlusion. No hemodynamically significant stenosis. Left carotid system: No dissection. No occlusion. No hemodynamically significant stenosis. Vertebral arteries: Mild narrowing at the origin of bilateral vertebral arteries. Other: None IMPRESSION: 1. High-grade narrowing at the A2 segment of the left ACA. 2. No large vessel occlusion in the head or neck. Electronically Signed   By: Lorenza Cambridge M.D.   On: 10/21/2022 12:50   MR ANGIO NECK W WO CONTRAST  Result Date: 10/21/2022 CLINICAL DATA:  Stroke suspected EXAM: MRA HEAD WITHOUT CONTRAST MRA NECK WITHOUT AND WITH CONTRAST TECHNIQUE: Angiographic images of the Circle of Willis were acquired using MRA technique without intravenous contrast. Angiographic images of the neck were acquired using MRA technique without and with intravenous contrast. Carotid stenosis measurements (when applicable) are obtained utilizing NASCET criteria, using the distal internal carotid diameter as the denominator. CONTRAST:  9mL GADAVIST GADOBUTROL 1 MMOL/ML IV SOLN COMPARISON:  None Available. FINDINGS: MRA HEAD FINDINGS Anterior circulation: High-grade narrowing of the A2 segment of the left ACA (series 5, image 140). Posterior circulation: No proximal occlusion. No significant stenosis. No aneurysm. Anatomic variants: None MRA NECK FINDINGS  Aortic arch: Three-vessel arch. No dissection. No stenosis. No significant plaque. Right carotid system: No dissection. No occlusion. No hemodynamically significant stenosis. Left carotid system: No dissection. No occlusion. No hemodynamically significant stenosis. Vertebral arteries: Mild narrowing at the origin of bilateral vertebral arteries. Other: None IMPRESSION: 1. High-grade narrowing at the A2 segment of the left ACA. 2. No large vessel occlusion in the head or  neck. Electronically Signed   By: Lorenza Cambridge M.D.   On: 10/21/2022 12:50   [name]  Result Date: 10/21/2022 CLINICAL DATA:  Stroke, follow up EXAM: MRI HEAD WITHOUT CONTRAST TECHNIQUE: Multiplanar, multiecho pulse sequences of the brain and surrounding structures were obtained without intravenous contrast. COMPARISON:  Brain MR 05/09/22 FINDINGS: Brain: Negative for an acute infarct. No hemorrhage. No hydrocephalus. Extra-axial fluid collection. Unchanged T2 hypointense extra-axial lesion along the falx measuring 8 x 8 mm, likely a meningioma series 9, image 47). Sequela severe chronic microvascular ischemic change with chronic infarcts in the left temporoparietal region and the bilateral cerebellar hemispheres Vascular: Normal flow voids. Skull and upper cervical spine: Incompletely imaged Sinuses/Orbits: No middle ear or mastoid effusion. Paranasal sinuses are grossly clear. Orbits are unremarkable. Other: None IMPRESSION: 1. No acute intracranial process. 2. Sequela of severe chronic microvascular ischemic change with chronic infarcts in the left temporoparietal region and the bilateral cerebellar hemispheres. Electronically Signed   By: Lorenza Cambridge M.D.   On: 10/21/2022 12:40   CT HEAD CODE STROKE WO CONTRAST  Result Date: 10/21/2022 CLINICAL DATA:  Code stroke. Neuro deficit, acute, stroke suspected. Left sided weakness. EXAM: CT HEAD WITHOUT CONTRAST TECHNIQUE: Contiguous axial images were obtained from the base of the skull through the vertex without intravenous contrast. RADIATION DOSE REDUCTION: This exam was performed according to the departmental dose-optimization program which includes automated exposure control, adjustment of the mA and/or kV according to patient size and/or use of iterative reconstruction technique. COMPARISON:  Head CT and MRI 05/09/2022 FINDINGS: Brain: There is no evidence of an acute infarct, intracranial hemorrhage, midline shift, or extra-axial fluid  collection. A moderate-sized chronic infarct in the posterior left cerebral hemisphere and small chronic bilateral cerebellar infarcts are unchanged. There is a background of extensive chronic small vessel ischemic disease in the cerebral white matter. Vascular: Calcified atherosclerosis at the skull base. No hyperdense vessel. Skull: No acute fracture or suspicious osseous lesion. Sinuses/Orbits: Opacification of a mid right ethmoid air cell. Clear mastoid air cells. Unremarkable orbits. Other: None. ASPECTS (Alberta Stroke Program Early CT Score) - Ganglionic level infarction (caudate, lentiform nuclei, internal capsule, insula, M1-M3 cortex): 7 - Supraganglionic infarction (M4-M6 cortex): 3 Total score (0-10 with 10 being normal): 10 These results were communicated to Dr. Thad Ranger at 11:04 am on 10/21/2022 by text page via the [facility] messaging system. IMPRESSION: 1. No evidence of acute intracranial abnormality. ASPECTS of 10. 2. Extensive chronic small vessel ischemic disease with chronic infarcts as above. Electronically Signed   By: Sebastian Ache M.D.   On: 10/21/2022 11:05    ECHO 04/2022: 1. Left ventricular ejection fraction, by estimation, is 60 to 65%. The  left ventricle has normal function. The left ventricle has no regional  wall motion abnormalities. Left ventricular diastolic parameters are  consistent with Grade I diastolic  dysfunction (impaired relaxation).   2. Right ventricular systolic function is normal. The right ventricular  size is normal. There is normal pulmonary artery systolic pressure. The  estimated right ventricular systolic pressure is 24.9 mmHg.   3. The  mitral valve is normal in structure. Mild mitral valve  regurgitation. No evidence of mitral stenosis.   4. The aortic valve is normal in structure. Aortic valve regurgitation is  not visualized. No aortic stenosis is present.   5. The inferior vena cava is normal in size with greater than 50%  respiratory variability,  suggesting right atrial pressure of 3 mmHg.   TELEMETRY reviewed by me 10/22/2022: sinus bradycardia rate 40-50s overnight, rate improved to 70s this AM  EKG reviewed by me: sinus bradycardia rate 51 bpm  Data reviewed by me 10/22/2022: last 24h vitals tele labs imaging I/O ED provider note, admission H&P  Principal Problem:   TIA (transient ischemic attack) Active Problems:   Other specified hypothyroidism   Type 2 diabetes mellitus with other specified complication (HCC)   COPD (chronic obstructive pulmonary disease) (HCC)   Seizure (HCC)   Hypertension   Recurrent major depressive disorder, in partial remission (HCC)   PAF (paroxysmal atrial fibrillation) (HCC)   Vascular dementia without behavioral disturbance (HCC)   Neuropathy    ASSESSMENT AND PLAN:  Caprisha Bridgett is a 77 y.o. female  with a past medical history of chronic diastolic CHF, atrial fibrillation (s/p ablation), CAD, remote history of STEMI s/p PCI/stent x3, TIA, CVA (right side residual weakness) DM type II, seizures, COPD, hypertension, hyperlipidemia, anemia  who presented to the ED on 10/21/2022 for left sided weakness and left sided facial droop. Noted to be bradycardic on telemetry. Cardiology was consulted for further evaluation.   # Bradycardia # Paroxysmal atrial fibrillation # Hypertension Patient with hx of pAF admitted for TIA noted to be bradycardic on EKG as well as telemetry. HR in the 40-50s overnight improving to the 70s throughout the day. She is without significant dizziness or syncope. Not on any AV nodal blockers. No evidence of high grade AV block on telemetry. Echo 04/2022 with preserved EF.  -Will place holter monitor for additional evaluation of heart rate upon discharge.  -Avoid AV nodal blockers due to baseline bradycardia.  -Continue lisinopril 10 mg daily for BP control.  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   -Patient to monitor for worsening dizziness, syncope symptoms.   # TIA # Hx CVA # Hx seizures Patient presenting with left sided weakness and facial droop. Prior hx of CVA and multiple TIAs. CT and MRI without evidence of acute CVA. Symptoms improved overall today, still with some word finding difficulty.  -Continue atorvastatin and plavix. -Management per primary, neurology.   Ok for discharge today from a cardiac perspective. Will arrange for follow up in clinic with Dr. Juliann Pares in 1-2 weeks.   This patient's plan of care was discussed and created with Dr. Juliann Pares and he is in agreement.  Signed: Gale Journey, PA-C  10/22/2022, 11:33 AM [facility] Cardiology

## 2022-10-22 NOTE — Evaluation (Signed)
Occupational Therapy Evaluation Patient Details Name: Samantha Aguirre MRN: [id number] DOB: Feb 04, 1945 Today's Date: 10/22/2022   History of Present Illness 77 y/o female presented to ED on 10/21/22 for L facial droop and L LE weakness. MRI negative. PMH: CAD, DM, HTN, stroke with R residual hemiparesis and visual changes, seizures, Afib   Clinical Impression   Pt was seen for OT evaluation this date. Prior to hospital admission, pt was MOD I - independent. Pt lives at [facility] facility. Pt was alert, oriented to self, and disorientated to date, ?place. Pt was difficult to redirect from orientation questions. PTA Pt used a rollator for mobility. Pt was sidelying sitting in bed on arrival to room. Overall bed mobility; Pt requires supervision. Pt initially required supervision during amb however, Pt stated feeling light headed while amb to bathroom. Upgraded assist level to CGA as precaution. Pt completed toilet t/f and stood at sink level to wash hands - CGA + RW. Pt was handed off to PT post bathroom t/f. Pt would benefit from skilled OT services to address noted impairments and functional limitations (see below for any additional details) in order to maximize safety and independence while minimizing falls risk and caregiver burden. OT will follow acutely.    If plan is discharge home, recommend the following: Direct supervision/assist for financial management;Supervision due to cognitive status;Assist for transportation;Direct supervision/assist for medications management;Assistance with cooking/housework    Functional Status Assessment  Patient has had a recent decline in their functional status and demonstrates the ability to make significant improvements in function in a reasonable and predictable amount of time.  Equipment Recommendations  Other (comment) (next venue)    Recommendations for Other Services       Precautions / Restrictions Precautions Precautions:  Fall Restrictions Weight Bearing Restrictions: No      Mobility Bed Mobility Overal bed mobility: Needs Assistance Bed Mobility: Sidelying to Sit   Sidelying to sit: Supervision, HOB elevated, Used rails            Transfers Overall transfer level: Needs assistance Equipment used: Rolling walker (2 wheels) Transfers: Sit to/from Stand Sit to Stand: Supervision                  Balance Overall balance assessment: No apparent balance deficits (not formally assessed)                                         ADL either performed or assessed with clinical judgement   ADL Overall ADL's : Needs assistance/impaired     Grooming: Wash/dry hands;Standing;Supervision/safety Grooming Details (indicate cue type and reason): sink level + RW               Lower Body Dressing Details (indicate cue type and reason): Pt dressed in personal clothing on arrival to room, stating she dressed herself.   Toilet Transfer Details (indicate cue type and reason): simulated toilet t/f; ambulated to bathroom with RW + CGA  due to pt reporting feeling slightly light heading duirng amb.         Functional mobility during ADLs: Contact guard assist;Supervision/safety;Rolling walker (2 wheels)       Vision Baseline Vision/History: 1 Wears glasses       Perception Perception: Within Functional Limits       Praxis Praxis: WFL       Pertinent Vitals/Pain Pain Assessment Pain Assessment: 0-10  Pain Score: 8  Pain Location: Pt reports headache 8/10, stating that is a daily occurrence; facial expressions appear to be [facility] dispite pain Pain Intervention(s): Limited activity within patient's tolerance, Monitored during session     Extremity/Trunk Assessment Upper Extremity Assessment Upper Extremity Assessment: Overall WFL for tasks assessed   Lower Extremity Assessment Lower Extremity Assessment: Overall WFL for tasks assessed   Cervical / Trunk  Assessment Cervical / Trunk Assessment: Normal   Communication Communication Communication: No apparent difficulties Cueing Techniques: Verbal cues   Cognition Arousal: Alert Behavior During Therapy: WFL for tasks assessed/performed Overall Cognitive Status: No family/caregiver present to determine baseline cognitive functioning Area of Impairment: Orientation, Safety/judgement, Problem solving, Memory, Following commands                 Orientation Level: Disoriented to, Place   Memory: Decreased short-term memory Following Commands: Follows multi-step commands with increased time Safety/Judgement: Decreased awareness of deficits   Problem Solving: Slow processing, Decreased initiation, Difficulty sequencing, Requires verbal cues, Requires tactile cues General Comments: Alert, orientated to self, disorientated to ?place, date     General Comments       Exercises Other Exercises Other Exercises: Edu: Role of OT, safe ADL completion   Shoulder Instructions      Home Living Family/patient expects to be discharged to:: Assisted living                             Home Equipment: Rollator (4 wheels)   Additional Comments: Pt is a resident at [facility] in [place]      Prior Functioning/Environment Prior Level of Function : Independent/Modified Independent             Mobility Comments: MOD I with amb with rollator ADLs Comments: MOD I - Indep with ADLs        OT Problem List: Decreased cognition;Decreased safety awareness;Decreased coordination;Decreased activity tolerance;Pain      OT Treatment/Interventions: Self-care/ADL training;Therapeutic exercise;Therapeutic activities;DME and/or AE instruction;Cognitive remediation/compensation;Energy conservation    OT Goals(Current goals can be found in the care plan section) Acute Rehab OT Goals Patient Stated Goal: Return to home OT Goal Formulation: With patient Time For Goal Achievement:  11/05/22 Potential to Achieve Goals: Good ADL Goals Pt Will Perform Grooming: with modified independence;standing Pt Will Perform Lower Body Dressing: with modified independence;sit to/from stand Pt Will Transfer to Toilet: with modified independence;ambulating Pt Will Perform Toileting - Clothing Manipulation and hygiene: with modified independence;sit to/from stand  OT Frequency: Min 1X/week    Co-evaluation              AM-PAC OT "6 Clicks" Daily Activity     Outcome Measure Help from another person eating meals?: None Help from another person taking care of personal grooming?: A Little Help from another person toileting, which includes using toliet, bedpan, or urinal?: None Help from another person bathing (including washing, rinsing, drying)?: None Help from another person to put on and taking off regular upper body clothing?: None Help from another person to put on and taking off regular lower body clothing?: A Little 6 Click Score: 22   End of Session Equipment Utilized During Treatment: Gait belt;Rolling walker (2 wheels) Nurse Communication: Mobility status  Activity Tolerance: Patient tolerated treatment well Patient left:  (Hand off to PT post bathroom t/f)  OT Visit Diagnosis: Unsteadiness on feet (R26.81);Muscle weakness (generalized) (M62.81);Other abnormalities of gait and mobility (R26.89)  Time: 1610-9604 OT Time Calculation (min): 14 min Charges:  OT General Charges $OT Visit: 1 Visit OT Evaluation $OT Eval Moderate Complexity: 1 Mod  [employer], OTS

## 2022-11-22 ENCOUNTER — Emergency Department
Admission: EM | Admit: 2022-11-22 | Discharge: 2022-11-22 | Disposition: A | Discharge status: Home / Self Care | Payer: Medicare Other | Attending: Emergency Medicine | Admitting: Emergency Medicine

## 2022-11-22 ENCOUNTER — Emergency Department: Payer: Medicare Other

## 2022-11-22 ENCOUNTER — Other Ambulatory Visit: Payer: Self-pay

## 2022-11-22 DIAGNOSIS — Z85828 Personal history of other malignant neoplasm of skin: Secondary | ICD-10-CM | POA: Insufficient documentation

## 2022-11-22 DIAGNOSIS — I4891 Unspecified atrial fibrillation: Secondary | ICD-10-CM | POA: Insufficient documentation

## 2022-11-22 DIAGNOSIS — Z7984 Long term (current) use of oral hypoglycemic drugs: Secondary | ICD-10-CM | POA: Diagnosis not present

## 2022-11-22 DIAGNOSIS — Z7901 Long term (current) use of anticoagulants: Secondary | ICD-10-CM | POA: Diagnosis not present

## 2022-11-22 DIAGNOSIS — J449 Chronic obstructive pulmonary disease, unspecified: Secondary | ICD-10-CM | POA: Diagnosis not present

## 2022-11-22 DIAGNOSIS — F17210 Nicotine dependence, cigarettes, uncomplicated: Secondary | ICD-10-CM | POA: Insufficient documentation

## 2022-11-22 DIAGNOSIS — E785 Hyperlipidemia, unspecified: Secondary | ICD-10-CM | POA: Insufficient documentation

## 2022-11-22 DIAGNOSIS — Z8673 Personal history of transient ischemic attack (TIA), and cerebral infarction without residual deficits: Secondary | ICD-10-CM | POA: Insufficient documentation

## 2022-11-22 DIAGNOSIS — R29810 Facial weakness: Secondary | ICD-10-CM | POA: Diagnosis not present

## 2022-11-22 DIAGNOSIS — I2510 Atherosclerotic heart disease of native coronary artery without angina pectoris: Secondary | ICD-10-CM | POA: Diagnosis not present

## 2022-11-22 DIAGNOSIS — E119 Type 2 diabetes mellitus without complications: Secondary | ICD-10-CM | POA: Insufficient documentation

## 2022-11-22 DIAGNOSIS — I110 Hypertensive heart disease with heart failure: Secondary | ICD-10-CM | POA: Diagnosis not present

## 2022-11-22 DIAGNOSIS — R251 Tremor, unspecified: Secondary | ICD-10-CM | POA: Diagnosis not present

## 2022-11-22 DIAGNOSIS — E079 Disorder of thyroid, unspecified: Secondary | ICD-10-CM | POA: Insufficient documentation

## 2022-11-22 DIAGNOSIS — I5030 Unspecified diastolic (congestive) heart failure: Secondary | ICD-10-CM | POA: Insufficient documentation

## 2022-11-22 DIAGNOSIS — I6522 Occlusion and stenosis of left carotid artery: Secondary | ICD-10-CM | POA: Diagnosis not present

## 2022-11-22 DIAGNOSIS — R531 Weakness: Secondary | ICD-10-CM | POA: Insufficient documentation

## 2022-11-22 DIAGNOSIS — R569 Unspecified convulsions: Secondary | ICD-10-CM | POA: Diagnosis not present

## 2022-11-22 DIAGNOSIS — R29898 Other symptoms and signs involving the musculoskeletal system: Secondary | ICD-10-CM | POA: Diagnosis not present

## 2022-11-22 LAB — DIFFERENTIAL
Abs Immature Granulocytes: 0.02 10*3/uL (ref 0.00–0.07)
Basophils Absolute: 0.1 10*3/uL (ref 0.0–0.1)
Basophils Relative: 1 %
Eosinophils Absolute: 0.1 10*3/uL (ref 0.0–0.5)
Eosinophils Relative: 2 %
Immature Granulocytes: 0 %
Lymphocytes Relative: 30 %
Lymphs Abs: 2.5 10*3/uL (ref 0.7–4.0)
Monocytes Absolute: 0.8 10*3/uL (ref 0.1–1.0)
Monocytes Relative: 10 %
Neutro Abs: 4.6 10*3/uL (ref 1.7–7.7)
Neutrophils Relative %: 57 %

## 2022-11-22 LAB — CBC
HCT: 37.4 % (ref 36.0–46.0)
Hemoglobin: 12.3 g/dL (ref 12.0–15.0)
MCH: 30.9 pg (ref 26.0–34.0)
MCHC: 32.9 g/dL (ref 30.0–36.0)
MCV: 94 fL (ref 80.0–100.0)
Platelets: 254 10*3/uL (ref 150–400)
RBC: 3.98 MIL/uL (ref 3.87–5.11)
RDW: 13.6 % (ref 11.5–15.5)
WBC: 8.2 10*3/uL (ref 4.0–10.5)
nRBC: 0 % (ref 0.0–0.2)

## 2022-11-22 LAB — COMPREHENSIVE METABOLIC PANEL
ALT: 10 U/L (ref 0–44)
AST: 18 U/L (ref 15–41)
Albumin: 3.8 g/dL (ref 3.5–5.0)
Alkaline Phosphatase: 83 U/L (ref 38–126)
Anion gap: 10 (ref 5–15)
BUN: 18 mg/dL (ref 8–23)
CO2: 24 mmol/L (ref 22–32)
Calcium: 8.9 mg/dL (ref 8.9–10.3)
Chloride: 104 mmol/L (ref 98–111)
Creatinine, Ser: 0.57 mg/dL (ref 0.44–1.00)
GFR, Estimated: >60 mL/min (ref >60)
Glucose, Bld: 74 mg/dL (ref 70–99)
Potassium: 4.1 mmol/L (ref 3.5–5.1)
Sodium: 138 mmol/L (ref 135–145)
Total Bilirubin: 0.2 mg/dL (ref <1.2)
Total Protein: 6.7 g/dL (ref 6.5–8.1)

## 2022-11-22 LAB — PROTIME-INR
INR: 1.1 (ref 0.8–1.2)
Prothrombin Time: 14.7 s (ref 11.4–15.2)

## 2022-11-22 LAB — ETHANOL: Alcohol, Ethyl (B): <10 mg/dL (ref <10)

## 2022-11-22 LAB — APTT: aPTT: 28 s (ref 24–36)

## 2022-11-22 MED ORDER — LEVETIRACETAM 750 MG PO TABS: 750.0000 mg | ORAL_TABLET | Freq: Two times a day (BID) | ORAL | 1 refills | Status: DC

## 2022-11-22 MED ORDER — MIDAZOLAM HCL 2 MG/2ML IJ SOLN
1.0000 mg | Freq: Once | INTRAMUSCULAR | Status: AC
  Administered 2022-11-22: 1 mg via INTRAVENOUS
  Filled 2022-11-22: qty 2

## 2022-11-22 MED ORDER — IOHEXOL 350 MG/ML SOLN: 75.0000 mL | Freq: Once | INTRAVENOUS | Status: DC | PRN

## 2022-11-22 MED ORDER — SODIUM CHLORIDE 0.9% FLUSH
3.0000 mL | Freq: Once | INTRAVENOUS | Status: AC
  Administered 2022-11-22: 3 mL via INTRAVENOUS

## 2022-11-22 MED ORDER — MIDAZOLAM HCL 2 MG/2ML IJ SOLN: 1.0000 mg | Freq: Once | INTRAMUSCULAR | Status: DC

## 2022-11-22 NOTE — ED Provider Notes (Signed)
Emergency department handoff note  Care of this patient was signed out to me at the end of the previous provider shift.  All pertinent patient information was conveyed and all questions were answered.  Patient pending results of MRI/MRA that were reviewed with Dr. Amada Jupiter who agrees that this is likely not an acute stroke/TIA given symptoms and results and recommends increasing patient's Keppra from 500 twice daily to 750 twice daily which was done upon discharge.  The patient has been reexamined and is ready to be discharged.  All diagnostic results have been reviewed and discussed with the patient/family.  Care plan has been outlined and the patient/family understands all current diagnoses, results, and treatment plans.  There are no new complaints, changes, or physical findings at this time.  All questions have been addressed and answered.  All medications, if any, that were given while in the emergency department or any that are being prescribed have been reviewed with the patient/family.  All side effects and adverse reactions have been explained.  Patient was instructed to, and agrees to follow-up with their primary care physician as well as return to the emergency department if any new or worsening symptoms develop.   Merwyn Katos, MD 11/22/22 [phone]

## 2022-11-22 NOTE — Discharge Instructions (Addendum)
Please increase your Keppra to 750mg  twice a day and follow up with your neurologist as soon as possible

## 2022-11-22 NOTE — ED Notes (Signed)
Code stroke canc At 13:05

## 2022-11-22 NOTE — ED Notes (Signed)
Pt to MRI

## 2022-11-22 NOTE — ED Notes (Signed)
Back from MRI, "feeling better", NAD, calm, interactive.

## 2022-11-22 NOTE — ED Notes (Signed)
CBG 71.  

## 2022-11-22 NOTE — ED Triage Notes (Signed)
BIB [facility] from [facility] for code stroke, [facility] notified, code stroke called PTA, arrives to full team at the bridge.

## 2022-11-22 NOTE — ED Notes (Signed)
No changes since arrival. Pt remains alert, NAD, calm, interactive, "feel better", speech clear. Neuro MD at [facility]. New LKN confirmed as 1930 last night (Friday night) 11/8.

## 2022-11-22 NOTE — ED Provider Notes (Signed)
[facility] Provider Note    Event Date/Time   First MD Initiated Contact with Patient 11/22/22 1244     (approximate)   History   Left-sided weakness  HPI  Samantha Aguirre is a 77 y.o. female who reports that around 7:30 AM she started feeling weak in the left arm and left leg.  Very similar to something that happened a month or 2 ago  She is taking anticoagulant, nursing home records verify this, and she took a dose this morning.  She has a history of previous stroke with right-sided symptoms.  No pain.  No headache.     Physical Exam   Triage Vital Signs: ED Triage Vitals  Encounter Vitals Group     BP      Systolic BP Percentile      Diastolic BP Percentile      Pulse      Resp      Temp      Temp src      SpO2      Weight      Height      Head Circumference      Peak Flow      Pain Score      Pain Loc      Pain Education      Exclude from Growth Chart     Most recent vital signs: Vitals:   11/22/22 1510 11/22/22 1515  BP:    Pulse:    Resp: (!) 21 18  Temp:    SpO2: 95% 100%     General: Awake, no distress.  Questionable if the patient is a very slight facial droop on the left CV:  Good peripheral perfusion.  Resp:  Normal effort.  Abd:   Other:  Slight facial droop on the left.  When she first came in she had her left hand clutched and it seemed slightly weakened, however with further testing and questioning while performing other measures as noted she is able to open and close the left hand.  Very questionable as to whether there is a very slight amount of drift in the left hand compared to the right.  There is certainly no flaccid paralysis.  Sensation is intact.  Tongue protrudes at midline.  Deferred a neurology for detailed full NIH stroke scale which was performed by Dr. Amada Jupiter   ED Results / Procedures / Treatments   Labs (all labs ordered are listed, but only abnormal results are displayed) Labs  Reviewed  PROTIME-INR  APTT  CBC  DIFFERENTIAL  COMPREHENSIVE METABOLIC PANEL  ETHANOL  I-STAT CREATININE, ED  CBG MONITORING, ED  I-STAT CREATININE (MANUAL ENTRY)   CBC and comprehensive metabolic panel normal.  INR is very minimally elevated but would be potentially in keeping with use of oral anticoagulant as listed on her nursing home record  EKG  And interpreted by me at 1310 heart rate 55 QRS 100 QTc 420 Normal sinus rhythm no evidence of acute ischemia   RADIOLOGY  CT head interpreted by me as negative for acute intracranial hemorrhage     PROCEDURES:  Critical Care performed: Yes, see critical care procedure note(s)  CRITICAL CARE Performed by: Sharyn Creamer   Total critical care time: 30 minutes  Critical care time was exclusive of separately billable procedures and treating other patients.  Critical care was necessary to treat or prevent imminent or life-threatening deterioration.  Critical care was time spent personally by me on the following activities: development  of treatment plan with patient and/or surrogate as well as nursing, discussions with consultants, evaluation of patient's response to treatment, examination of patient, obtaining history from patient or surrogate, ordering and performing treatments and interventions, ordering and review of laboratory studies, ordering and review of radiographic studies, pulse oximetry and re-evaluation of patient's condition.   Procedures   MEDICATIONS ORDERED IN ED: Medications  iohexol (OMNIPAQUE) 350 MG/ML injection 75 mL (has no administration in time range)  sodium chloride flush (NS) 0.9 % injection 3 mL (3 mLs Intravenous Given 11/22/22 1318)  midazolam (VERSED) injection 1 mg (1 mg Intravenous Given 11/22/22 1317)     IMPRESSION / MDM / ASSESSMENT AND PLAN / ED COURSE  I reviewed the triage vital signs and the nursing notes.                              Patient is met at the EMS/ED bridge along with  Dr. Amada Jupiter our neurologist.  Neurology escorting patient for further imaging  Differential diagnosis includes, but is not limited to, ischemic stroke, seizure, Todd's paralysis, large vessel occlusion, hemorrhage especially given use of anticoagulant, metabolic, toxic, recrudescence or other cause.  The differential is quite broad.  She does not demonstrate any hard clear findings to suggest going major stroke or large vessel occlusion.  Her visual acuity appears normal and there is no aphasia and no obvious neglect.    Records reviewed patient was evaluated for a TIA and discharged October 9.  Also a history of seizures.  Neurology increased her gabapentin at that time  Patient's presentation is most consistent with acute presentation with potential threat to life or bodily function.   The patient is on the cardiac monitor to evaluate for evidence of arrhythmia and/or significant heart rate changes.     Clinical Course as of 11/22/22 1548  Sat Nov 22, 2022  1303 CT HEAD CODE STROKE WO CONTRAST IMPRESSION:  1. No hemorrhage or CT evidence of an acute cortical infarct.  2. Chronic infarcts in the left parietal temporal region and  bilateral cerebellar hemispheres   [MQ]  1310 Dr. Amada Jupiter advises recommends giving 1 mg of Versed to see if this alleviates some of the symptomatology seen in her left arm [MQ]    Clinical Course User Index [MQ] Sharyn Creamer, MD   ----------------------------------------- 2:50 PM on 11/22/2022 ----------------------------------------- Patient resting comfortably fully awake and alert.  Has had MRI and currently awaiting results.  She reports that her hand feels completely back to normal she is able to demonstrate good use of the hand moves all extremities well there is no facial droop or weakness to noted.  She reports feeling back to her normal and baseline.  Ongoing care with plan to discuss improved exam, as well as discussed results of MRI once  study completes with Dr. Amada Jupiter for further recommendations and probable disposition.  Dr. Vicente Males to follow-up  FINAL CLINICAL IMPRESSION(S) / ED DIAGNOSES   Final diagnoses:  Weakness of left hand     Rx / DC Orders   ED Discharge Orders     None        Note:  This document was prepared using Dragon voice recognition software and may include unintentional dictation errors.   Sharyn Creamer, MD 11/22/22 [phone]

## 2022-11-22 NOTE — ED Notes (Signed)
Code stroke cancelled per neuro MD Amada Jupiter, versed ordered (vov)for facial twitching/ tremor.

## 2022-11-22 NOTE — ED Notes (Signed)
Back to room 11 from CT.

## 2022-11-22 NOTE — ED Notes (Signed)
Pt ambulated to bathroom and back with walker and supervision.  Gait steady.

## 2022-11-22 NOTE — ED Notes (Signed)
Pt toileted in bathroom with 1 assist.  Urine sample obtained and sent to lab.

## 2022-11-22 NOTE — ED Notes (Signed)
EDP at [facility], in to see.

## 2022-11-22 NOTE — ED Notes (Addendum)
Pt ambulated from bathroom back to bed with walker and supervision.  Gait steady.

## 2022-11-22 NOTE — ED Notes (Signed)
Up to b/r, with walker, steady gait.

## 2022-11-22 NOTE — Consult Note (Signed)
NEUROLOGY CONSULT NOTE   Date of service: November 22, 2022 Patient Name: Samantha Aguirre MRN:  [id number] DOB:  [birth year] Chief Complaint: "left sided numbness" Requesting Provider: Sharyn Creamer, MD  History of Present Illness  Samantha Aguirre is a 77 y.o. female  has a past medical history of Acid reflux, Arthritis, Asthma, Atrial fibrillation (HCC), Cancer (HCC), CHF (congestive heart failure) (HCC), COPD (chronic obstructive pulmonary disease) (HCC), Coronary artery disease, Diabetes mellitus without complication (HCC), Frequent headaches, GI bleed, HLD (hyperlipidemia), Hypertension, Seizures (HCC), Skin cancer (2015), Stroke (HCC) (04/2017), Thyroid disease, and Tremors of nervous system. who presents with left-sided dysfunction.  Samantha Aguirre states that Samantha Aguirre was awoken from sleep by her left arm hurting, and it seemed to be held in a rigid position.  Samantha Aguirre also noted some twitching in her left face.  Things are improving on arrival to the emergency department, but still present to some degree.  Samantha Aguirre has a history of atrial fibrillation and is anticoagulated with Eliquis.    LKW: 7:30 pm Modified rankin score: 0-Completely asymptomatic and back to baseline post- stroke  Past History   Past Medical History:  Diagnosis Date   Acid reflux    Arthritis    Bilateral knees, hands, feet   Asthma    Atrial fibrillation (HCC)    Cancer (HCC)    CHF (congestive heart failure) (HCC)    Diastolic CHF   COPD (chronic obstructive pulmonary disease) (HCC)    Coronary artery disease    Diabetes mellitus without complication (HCC)    Frequent headaches    GI bleed    HLD (hyperlipidemia)    Hypertension    Seizures (HCC)    Skin cancer 2015   Suspected basal cell carcinoma on nose, surgically removed   Stroke (HCC) 04/2017   Thyroid disease    Tremors of nervous system     Past Surgical History:  Procedure Laterality Date   ABDOMINAL HYSTERECTOMY  1976   APPENDECTOMY  1980   BACK SURGERY      cardiac stents     CAROTID ENDARTERECTOMY     CHOLECYSTECTOMY  1980   COLONOSCOPY N/A 02/20/2017   Procedure: COLONOSCOPY;  Surgeon: Toney Reil, MD;  Location: [facility] ENDOSCOPY;  Service: Gastroenterology;  Laterality: N/A;   ESOPHAGOGASTRODUODENOSCOPY N/A 02/20/2017   Procedure: ESOPHAGOGASTRODUODENOSCOPY (EGD);  Surgeon: Toney Reil, MD;  Location: [facility] ENDOSCOPY;  Service: Gastroenterology;  Laterality: N/A;   ESOPHAGOGASTRODUODENOSCOPY (EGD) WITH PROPOFOL N/A 07/08/2017   Procedure: ESOPHAGOGASTRODUODENOSCOPY (EGD) WITH PROPOFOL;  Surgeon: Midge Minium, MD;  Location: [facility] ENDOSCOPY;  Service: Endoscopy;  Laterality: N/A;   GALLBLADDER SURGERY  1980   GIVENS CAPSULE STUDY N/A 07/09/2017   Procedure: GIVENS CAPSULE STUDY;  Surgeon: Wyline Mood, MD;  Location: [facility] ENDOSCOPY;  Service: Gastroenterology;  Laterality: N/A;   KNEE SURGERY     left   KYPHOPLASTY N/A 03/16/2018   Procedure: KYPHOPLASTY, L5;  Surgeon: Kennedy Bucker, MD;  Location: [facility] ORS;  Service: Orthopedics;  Laterality: N/A;   REVERSE SHOULDER ARTHROPLASTY Right 08/25/2019   Procedure: REVERSE SHOULDER ARTHROPLASTY;  Surgeon: Christena Flake, MD;  Location: [facility] ORS;  Service: Orthopedics;  Laterality: Right;   ROTATOR CUFF REPAIR     right   TONSILLECTOMY  1950    Family History: Family History  Problem Relation Age of Onset   Breast cancer Mother    Heart disease Mother    Stroke Mother    Cancer Mother    COPD Mother    Diabetes Mother  Heart disease Father    Diabetes Father    Stroke Father    Alcohol abuse Sister    Drug abuse Sister    Stroke Sister    Cancer Sister    Mental illness Sister    Heart disease Brother    Arthritis Brother    Diabetes Brother    Heart disease Maternal Grandfather    Heart disease Paternal Grandfather     Social History  reports that Samantha Aguirre has been smoking cigarettes. Samantha Aguirre has a 53 pack-year smoking history. Samantha Aguirre has never used smokeless tobacco. Samantha Aguirre reports that  Samantha Aguirre does not drink alcohol and does not use drugs.  Allergies  Allergen Reactions   Ivp Dye [Iodinated Contrast Media] Itching    Pt injected with IV contrast.  5 min after injection pt complained of itching behind ear and on her abd.   Methotrexate Rash   Morphine And Codeine Other (See Comments)    "stops my breathing" NEAR-RESPIRATORY ARREST   Mushroom Extract Complex Anaphylaxis    Made patient "deathly sick"   Atenolol Other (See Comments)    "MD took me off of it bc it was doing something wrong" Made patient HYPOTENSIVE   Percocet [Oxycodone-Acetaminophen] Nausea And Vomiting and Other (See Comments)    Stomach pains   Percodan [Oxycodone-Aspirin] Nausea And Vomiting and Other (See Comments)    Stomach pains   Tramadol Nausea Only   Verapamil Other (See Comments)    " MD told me this was screwing me up" MAKES PATIENT HYPOTENSIVE   Oxycodone Hcl     Medications   Current Facility-Administered Medications:    iohexol (OMNIPAQUE) 350 MG/ML injection 75 mL, 75 mL, Intravenous, Once PRN, Rejeana Brock, MD  Current Outpatient Medications:    acetaminophen (TYLENOL) 500 MG tablet, Take 500 mg by mouth 3 (three) times daily., Disp: , Rfl:    albuterol (PROVENTIL HFA;VENTOLIN HFA) 108 (90 Base) MCG/ACT inhaler, Inhale 2 puffs into the lungs every 6 (six) hours as needed for wheezing or shortness of breath., Disp: 3 Inhaler, Rfl: 1   atorvastatin (LIPITOR) 10 MG tablet, Take 10 mg by mouth daily., Disp: , Rfl:    Cholecalciferol 50 MCG (2000 UT) TABS, Take 2,000 Units by mouth daily., Disp: , Rfl:    clopidogrel (PLAVIX) 75 MG tablet, Take 1 tablet (75 mg total) by mouth daily., Disp: 30 tablet, Rfl: 0   diclofenac Sodium (VOLTAREN) 1 % GEL, Apply 2 g topically 3 (three) times daily., Disp: , Rfl:    DULoxetine (CYMBALTA) 60 MG capsule, Take 1 capsule (60 mg total) by mouth daily., Disp: , Rfl:    ferrous sulfate 325 (65 FE) MG EC tablet, Take 1 tablet (325 mg total) by  mouth daily., Disp: , Rfl:    folic acid (FOLVITE) 1 MG tablet, Take 1 tablet (1 mg total) by mouth daily., Disp: , Rfl:    furosemide (LASIX) 20 MG tablet, Take 20 mg by mouth daily. , Disp: , Rfl:    gabapentin (NEURONTIN) 400 MG capsule, Take 1 capsule (400 mg total) by mouth 3 (three) times daily., Disp: 90 capsule, Rfl: 0   glipiZIDE-metformin (METAGLIP) 5-500 MG tablet, Take 1 tablet by mouth daily., Disp: , Rfl:    hydrocortisone cream 1 %, Apply 1 Application topically 2 (two) times daily as needed for itching., Disp: , Rfl:    levETIRAcetam (KEPPRA) 500 MG tablet, Take 1 tablet (500 mg total) by mouth 2 (two) times daily., Disp: 60 tablet, Rfl:  0   levothyroxine (SYNTHROID) 100 MCG tablet, Take 125 mcg by mouth daily before breakfast., Disp: , Rfl:    lisinopril (ZESTRIL) 10 MG tablet, Take 10 mg by mouth daily., Disp: , Rfl:    Melatonin 10 MG TABS, Take 10 mg by mouth at bedtime., Disp: , Rfl:    nystatin (MYCOSTATIN/NYSTOP) powder, Apply 1 Application topically 2 (two) times daily as needed (irritation)., Disp: , Rfl:    omeprazole (PRILOSEC) 40 MG capsule, Take 20 mg by mouth daily., Disp: , Rfl: 3   ondansetron (ZOFRAN) 4 MG tablet, Take 4 mg by mouth every 6 (six) hours as needed for nausea., Disp: , Rfl:   Vitals   Vitals:   12/02/22 1410 02-Dec-2022 1500 Dec 02, 2022 1510 02-Dec-2022 1515  BP:  (!) 139/92    Pulse: (!) 51 (!) 50    Resp: 18 (!) 9 (!) 21 18  Temp:      TempSrc:      SpO2: 97% 100% 95% 100%  Weight:        Body mass index is 31.08 kg/m.  Physical Exam   Constitutional: Appears well-developed and well-nourished.   Neurologic Examination     Neuro: Mental Status: Patient is awake, alert, oriented to person, place, month, year, and situation. Patient is able to give a clear and coherent history. No signs of aphasia or neglect Cranial Nerves: II: Visual Fields are full. Pupils are equal, round, and reactive to light.   III,IV, VI: EOMI without ptosis or  diploplia.  V: Facial sensation is symmetric to temperature VII: Facial movement with L facial weakness Motor: Samantha Aguirre has her left hand clenched into a fist Sensory: Sensation is symmetric to light touch and temperature in the arms and legs. Cerebellar: No clear ataxia       Labs/Imaging/Neurodiagnostic studies   CBC:  Recent Labs  Lab December 02, 2022 1244  WBC 8.2  NEUTROABS 4.6  HGB 12.3  HCT 37.4  MCV 94.0  PLT 254    Basic Metabolic Panel:  Lab Results  Component Value Date   NA 138 December 02, 2022   K 4.1 12-02-22   CO2 24 Dec 02, 2022   GLUCOSE 74 December 02, 2022   BUN 18 12/02/22   CREATININE 0.57 12/02/2022   CALCIUM 8.9 12-02-2022   GFRNONAA >60 12/02/22   GFRAA >60 09/12/2019    Lipid Panel:  Lab Results  Component Value Date   LDLCALC 94 10/22/2022    HgbA1c:  Lab Results  Component Value Date   HGBA1C 5.8 (H) 05/09/2022    Urine Drug Screen:     Component Value Date/Time   LABOPIA NONE DETECTED 10/21/2022 1136   LABOPIA NONE DETECTED 09/22/2017 1940   COCAINSCRNUR NONE DETECTED 10/21/2022 1136   LABBENZ NONE DETECTED 10/21/2022 1136   LABBENZ NONE DETECTED 09/22/2017 1940   AMPHETMU NONE DETECTED 10/21/2022 1136   AMPHETMU NONE DETECTED 09/22/2017 1940   THCU NONE DETECTED 10/21/2022 1136   THCU NONE DETECTED 09/22/2017 1940   LABBARB NONE DETECTED 10/21/2022 1136   LABBARB NONE DETECTED 09/22/2017 1940     Alcohol Level     Component Value Date/Time   ETH <10 12/02/2022 1244    INR  Lab Results  Component Value Date   INR 1.1 12/02/22    APTT  Lab Results  Component Value Date   APTT 28 2022-12-02    AED levels: No results found for: "PHENYTOIN", "ZONISAMIDE", "LAMOTRIGINE", "LEVETIRACETA"    CT Head without contrast(Personally reviewed): Negative Impression   Samantha Aguirre is  a 77 y.o. female  has a past medical history of Acid reflux, Arthritis, Asthma, Atrial fibrillation (HCC), Cancer (HCC), CHF (congestive heart  failure) (HCC), COPD (chronic obstructive pulmonary disease) (HCC), Coronary artery disease, Diabetes mellitus without complication (HCC), Frequent headaches, GI bleed, HLD (hyperlipidemia), Hypertension, Seizures (HCC), Skin cancer (2015), Stroke (HCC) (04/2017), Thyroid disease, and Tremors of nervous system.  Samantha Aguirre presents with left-sided dysfunction and twitching which would be most consistent with a focal seizure.  With her persistent symptoms, I do think an MRI to rule out acute ischemia given her history would be reasonable, but if this is negative then I would favor increasing her Keppra.  Recommendations  MRI brain, MRA head If negative, would increase Keppra to 750 twice daily (from 500 twice daily) If positive will need to be admitted ______________________________________________________________________    Signed,  Ritta Slot [facility]

## 2022-11-22 NOTE — ED Notes (Signed)
Pending MRI, expected in ~ 10 minutes

## 2022-11-24 LAB — CBG MONITORING, ED: Glucose-Capillary: 71 mg/dL (ref 70–99)

## 2022-11-26 ENCOUNTER — Emergency Department: Payer: Medicare Other

## 2022-11-26 ENCOUNTER — Other Ambulatory Visit: Payer: Self-pay

## 2022-11-26 ENCOUNTER — Emergency Department
Admission: EM | Admit: 2022-11-26 | Discharge: 2022-11-26 | Disposition: A | Discharge status: Home / Self Care | Payer: Medicare Other | Attending: Emergency Medicine | Admitting: Emergency Medicine

## 2022-11-26 DIAGNOSIS — E039 Hypothyroidism, unspecified: Secondary | ICD-10-CM | POA: Diagnosis not present

## 2022-11-26 DIAGNOSIS — I509 Heart failure, unspecified: Secondary | ICD-10-CM | POA: Insufficient documentation

## 2022-11-26 DIAGNOSIS — I110 Hypertensive heart disease with heart failure: Secondary | ICD-10-CM | POA: Insufficient documentation

## 2022-11-26 DIAGNOSIS — W01198A Fall on same level from slipping, tripping and stumbling with subsequent striking against other object, initial encounter: Secondary | ICD-10-CM | POA: Insufficient documentation

## 2022-11-26 DIAGNOSIS — E119 Type 2 diabetes mellitus without complications: Secondary | ICD-10-CM | POA: Insufficient documentation

## 2022-11-26 DIAGNOSIS — J449 Chronic obstructive pulmonary disease, unspecified: Secondary | ICD-10-CM | POA: Insufficient documentation

## 2022-11-26 DIAGNOSIS — Z7902 Long term (current) use of antithrombotics/antiplatelets: Secondary | ICD-10-CM | POA: Insufficient documentation

## 2022-11-26 DIAGNOSIS — W19XXXA Unspecified fall, initial encounter: Secondary | ICD-10-CM

## 2022-11-26 DIAGNOSIS — R001 Bradycardia, unspecified: Secondary | ICD-10-CM | POA: Diagnosis not present

## 2022-11-26 DIAGNOSIS — S0990XA Unspecified injury of head, initial encounter: Secondary | ICD-10-CM | POA: Diagnosis present

## 2022-11-26 DIAGNOSIS — S0003XA Contusion of scalp, initial encounter: Secondary | ICD-10-CM | POA: Diagnosis not present

## 2022-11-26 DIAGNOSIS — Z7901 Long term (current) use of anticoagulants: Secondary | ICD-10-CM | POA: Insufficient documentation

## 2022-11-26 NOTE — ED Provider Triage Note (Addendum)
Emergency Medicine Provider Triage Evaluation Note  Samantha Aguirre , a 77 y.o. female  was evaluated in triage.  Pt complains of head injury.  Review of Systems  Positive: Headache, head injury Negative: Chest pain, shortness of breath, dizziness  Physical Exam  There were no vitals taken for this visit. Gen:   Awake, no distress   Resp:  Normal effort  MSK:   Moves extremities without difficulty  Other:  Normal speech, no facial asymmetry  Medical Decision Making  Medically screening exam initiated at 6:38 AM.  Appropriate orders placed.  Samantha Aguirre was informed that the remainder of the evaluation will be completed by another provider, this initial triage assessment does not replace that evaluation, and the importance of remaining in the ED until their evaluation is complete.  Patient here from assisted living facility after a fall.  She is not sure what made her fall but denies chest pain, shortness of breath, palpitations or dizziness.  She did hit her head.  She is on Plavix and Eliquis.  She denies any other injury.  She declines any medication at this time for pain control.  Will obtain CT head and cervical spine.     Joal Eakle, Layla Maw, DO 11/26/22 [phone]

## 2022-11-26 NOTE — ED Triage Notes (Signed)
Pt to ED via EMS from [facility], pt had mechanical fall this morning outside hitting the back of her head on the pavement. Pt does take eliquis. Denies any neuro deficits at this time.

## 2022-11-26 NOTE — ED Provider Notes (Signed)
[facility] Provider Note    Event Date/Time   First MD Initiated Contact with Patient 11/26/22 0701     (approximate)   History   Chief Complaint Fall   HPI  Samantha Aguirre is a 77 y.o. female with past medical history of hypertension, hyperlipidemia, diabetes, CHF, atrial fibrillation on Eliquis, stroke, seizures, and hypothyroidism who presents to the ED following fall.  Per EMS, patient had a fall outside of her assisted living facility this morning where she went backwards and struck her head on the pavement.  She believes she lost her balance, falling backwards and striking her head.  She denies losing consciousness, does complain of headache to the left side of her head, but denies any neck pain.  She also denies any chest pain, abdominal pain, or extremity pain.  She currently takes both Eliquis and Plavix.     Physical Exam   Triage Vital Signs: ED Triage Vitals  Encounter Vitals Group     BP 11/26/22 0645 (!) 130/59     Systolic BP Percentile --      Diastolic BP Percentile --      Pulse Rate 11/26/22 0645 (!) 54     Resp 11/26/22 0645 16     Temp 11/26/22 0645 98.2 F (36.8 C)     Temp Source 11/26/22 0645 Oral     SpO2 11/26/22 0645 98 %     Weight 11/26/22 0640 164 lb 7.4 oz (74.6 kg)     Height 11/26/22 0640 5\' 1"  (1.549 m)     Head Circumference --      Peak Flow --      Pain Score 11/26/22 0639 7     Pain Loc --      Pain Education --      Exclude from Growth Chart --     Most recent vital signs: Vitals:   11/26/22 0645  BP: (!) 130/59  Pulse: (!) 54  Resp: 16  Temp: 98.2 F (36.8 C)  SpO2: 98%    Constitutional: Awake and alert. Eyes: Conjunctivae are normal. Head: Hematoma to left posterior scalp with no overlying laceration. Nose: No congestion/rhinnorhea. Mouth/Throat: Mucous membranes are moist.  Neck: No midline cervical spine tenderness to palpation. Cardiovascular: Normal rate, regular rhythm. Grossly  normal heart sounds.  2+ radial pulses bilaterally. Respiratory: Normal respiratory effort.  No retractions. Lungs CTAB.  No chest wall tenderness to palpation. Gastrointestinal: Soft and nontender. No distention. Musculoskeletal: No lower extremity tenderness nor edema.  No upper extremity bony tenderness to palpation. Neurologic:  Normal speech and language. No gross focal neurologic deficits are appreciated.    ED Results / Procedures / Treatments   Labs (all labs ordered are listed, but only abnormal results are displayed) Labs Reviewed - No data to display   EKG  ED ECG REPORT I, Chesley Noon, the attending physician, personally viewed and interpreted this ECG.   Date: 11/26/2022  EKG Time: 7:16  Rate: 53  Rhythm: sinus bradycardia  Axis: Normal  Intervals:none  ST&T Change: None  RADIOLOGY CT head reviewed and interpreted by me with no hemorrhage or midline shift.  PROCEDURES:  Critical Care performed: No  Procedures   MEDICATIONS ORDERED IN ED: Medications - No data to display   IMPRESSION / MDM / ASSESSMENT AND PLAN / ED COURSE  I reviewed the triage vital signs and the nursing notes.  77 y.o. female with past medical history of hypertension, hyperlipidemia, diabetes, CHF, atrial fibrillation on Eliquis, COPD, stroke, seizures, and hypothyroidism who presents to the ED complaining of left posterior headache after losing her balance and falling, striking her head on pavement.  Patient's presentation is most consistent with acute presentation with potential threat to life or bodily function.  Differential diagnosis includes, but is not limited to, intracranial injury, cervical spine injury, extremity injury.  Patient nontoxic-appearing and in no acute distress, vital signs remarkable for mild bradycardia but otherwise reassuring.  Patient has small hematoma to left posterior scalp but no other signs of significant injury.  We  will check CT head and cervical spine, no evidence of injury to her trunk or extremities.  Similar bradycardia was noted during recent admission, EKG shows sinus bradycardia and I doubt this was a contributing factor to her fall.  CT imaging shows left occipital scalp hematoma, but is otherwise unremarkable.  Patient ambulates with a steady gait here in the ED and is appropriate for discharge home with outpatient follow-up.  She was counseled to return to the ED for new or worsening symptoms, patient agrees with plan.      FINAL CLINICAL IMPRESSION(S) / ED DIAGNOSES   Final diagnoses:  Fall, initial encounter  Hematoma of scalp, initial encounter     Rx / DC Orders   ED Discharge Orders     None        Note:  This document was prepared using Dragon voice recognition software and may include unintentional dictation errors.   Chesley Noon, MD 11/26/22 0830

## 2022-12-08 ENCOUNTER — Emergency Department: Payer: Medicare Other

## 2022-12-08 ENCOUNTER — Encounter: Payer: Self-pay | Admitting: Emergency Medicine

## 2022-12-08 ENCOUNTER — Other Ambulatory Visit: Payer: Self-pay

## 2022-12-08 ENCOUNTER — Emergency Department
Admission: EM | Admit: 2022-12-08 | Discharge: 2022-12-08 | Disposition: A | Discharge status: Skilled Nursing Facility | Payer: Medicare Other | Attending: Emergency Medicine | Admitting: Emergency Medicine

## 2022-12-08 DIAGNOSIS — I110 Hypertensive heart disease with heart failure: Secondary | ICD-10-CM | POA: Insufficient documentation

## 2022-12-08 DIAGNOSIS — Z0000 Encounter for general adult medical examination without abnormal findings: Secondary | ICD-10-CM | POA: Diagnosis not present

## 2022-12-08 DIAGNOSIS — I2510 Atherosclerotic heart disease of native coronary artery without angina pectoris: Secondary | ICD-10-CM | POA: Diagnosis not present

## 2022-12-08 DIAGNOSIS — J449 Chronic obstructive pulmonary disease, unspecified: Secondary | ICD-10-CM | POA: Diagnosis not present

## 2022-12-08 DIAGNOSIS — I509 Heart failure, unspecified: Secondary | ICD-10-CM | POA: Insufficient documentation

## 2022-12-08 DIAGNOSIS — R519 Headache, unspecified: Secondary | ICD-10-CM | POA: Diagnosis present

## 2022-12-08 NOTE — ED Triage Notes (Signed)
Patient to ED via [facility] from [facility] for headache to the back of the head. States she had this pain for the past 2 week since her fall. Seen for same on 11/13. Does take eliquis. Patient alert to self, time, and place but disoriented to situation. Denies new falls.

## 2022-12-08 NOTE — ED Notes (Signed)
Pt refusing to have CT head completed.

## 2022-12-08 NOTE — Discharge Instructions (Signed)
Please follow-up with your doctor if you continue to have any ongoing headaches.  Return to the emergency department for any new trouble speaking any weakness or numbness of any arm or leg or any other symptom concerning to yourself or staff members.

## 2022-12-08 NOTE — ED Provider Notes (Signed)
[facility] Provider Note    Event Date/Time   First MD Initiated Contact with Patient 12/08/22 1407     (approximate)  History   Chief Complaint: Headache  HPI  Samantha Aguirre is a 77 y.o. female with a past medical history of CHF, COPD, CAD, hypertension, hyperlipidemia, CVA, presents to the emergency department for concern for headache.  According to report patient was referred for several weeks of headache.  However when I evaluated the patient she denies having headache.  She states she does not know why the nursing facility keeps sending her over.  Patient states she is having some word finding difficulty but that has not been changed since her recent CVA.  Was just admitted again in October for a TIA.  Patient denies any headaches denies any weakness or numbness.  I had ordered a CT scan on the patient however she states she does not want a CT as she just had a CT scan.  I reviewed the patient's records and she did have a CT performed approximately 2 weeks ago showing no acute or concerning finding.  Physical Exam   Triage Vital Signs: ED Triage Vitals  Encounter Vitals Group     BP 12/08/22 1259 112/73     Systolic BP Percentile --      Diastolic BP Percentile --      Pulse Rate 12/08/22 1259 64     Resp 12/08/22 1259 18     Temp 12/08/22 1259 98.4 F (36.9 C)     Temp Source 12/08/22 1259 Oral     SpO2 12/08/22 1259 99 %     Weight 12/08/22 1300 163 lb 2.3 oz (74 kg)     Height 12/08/22 1300 5\' 1"  (1.549 m)     Head Circumference --      Peak Flow --      Pain Score 12/08/22 1300 6     Pain Loc --      Pain Education --      Exclude from Growth Chart --     Most recent vital signs: Vitals:   12/08/22 1259  BP: 112/73  Pulse: 64  Resp: 18  Temp: 98.4 F (36.9 C)  SpO2: 99%    General: Awake, no distress.  CV:  Good peripheral perfusion.  Resp:  Normal effort. Abd:  No distention.     ED Results / Procedures / Treatments    MEDICATIONS ORDERED IN ED: Medications - No data to display   IMPRESSION / MDM / ASSESSMENT AND PLAN / ED COURSE  I reviewed the triage vital signs and the nursing notes.  Patient's presentation is most consistent with acute illness / injury with system symptoms.  Patient sent to the emergency department for evaluation of headaches.  However patient denies any headaches and she does not want repeat CT imaging or repeat workup.  Patient states she was just admitted for similar thing in October and was seen in the emergency department 2 weeks ago had a CT scan of the head.  As the patient is denying any headache weakness numbness and had a recent CT 2 weeks ago we will hold off on additional CT imaging.  Offered medication for the headache however patient again reassures me that she does not have any headache.  Patient states she is just wishing to go home.  Will discharge patient home per her wishes.  FINAL CLINICAL IMPRESSION(S) / ED DIAGNOSES   Medical evaluation    Note:  This document was prepared using Dragon voice recognition software and may include unintentional dictation errors.   Minna Antis, MD 12/08/22 1452

## 2023-01-02 ENCOUNTER — Emergency Department
Admission: EM | Admit: 2023-01-02 | Discharge: 2023-01-02 | Discharge status: Against Medical Advice | Payer: Medicare Other | Attending: Emergency Medicine | Admitting: Emergency Medicine

## 2023-01-02 DIAGNOSIS — R109 Unspecified abdominal pain: Secondary | ICD-10-CM | POA: Diagnosis present

## 2023-01-02 DIAGNOSIS — Z5321 Procedure and treatment not carried out due to patient leaving prior to being seen by health care provider: Secondary | ICD-10-CM | POA: Insufficient documentation

## 2023-01-02 NOTE — ED Notes (Signed)
First nurse note: Pt here via [facility] states pt from facility but not sure what facility. ABD pain X1 week.   CBG 188 120/42 98.5

## 2023-01-02 NOTE — ED Triage Notes (Signed)
Patient states abdominal cramping since yesterday; during triage patient requests to leave due to wait time.

## 2023-02-05 NOTE — Progress Notes (Signed)
 Established Patient Visit   Chief Complaint: Chief Complaint  Patient presents with  . 6 month follow up   Date of Service: 02/18/2023 Date of Birth: Jan 17, 1945 PCP: Sadie Tamra Cal, MD  History of Present Illness: Samantha Aguirre is a 78 y.o.female patient who presents for a 3 month follow up. PMH significant for a-fib, bradycardia, hypertension, CHF, COPD, hyperlipidemia, type 2 diabetes, cerebral infarction, TIA, smoking.  Today, pt presents with improved SOB. Recent holter looked good, didn't notice any signs of a-fib. Still on blood thinners, no bleeding. Sleep has been okay. Has not been regularly exercising, will start soon. Endorses some swelling in feet and ankles. Not much heart racing. History of stroke. Smokes daily. Has had some recent falls due to sudden loss of balance. Is not using any inhalers. Memory is up and down. States that she has great sugar levels. Appetite is sometimes good and sometimes bad. Denies noticing any recent chest pain.   Visit Summaries: 11/11/2023 Patient was seen by me for a follow up and presented with a-fib, consider 72 hr holter.   Past Medical and Surgical History  Past Medical History Past Medical History:  Diagnosis Date  . COPD (chronic obstructive pulmonary disease) (CMS/HHS-HCC)   . Coronary artery disease   . Diabetes mellitus type 2, uncomplicated (CMS/HHS-HCC)   . GERD (gastroesophageal reflux disease)   . Heart disease   . Hyperlipidemia   . Hypertension   . Myocardial infarction (CMS/HHS-HCC)   . Stroke (CMS/HHS-HCC)     Past Surgical History She has a past surgical history that includes Laparoscopic cholecystectomy; Coronary angioplasty; Tonsillectomy; Carotid endarterectomy; Hysterectomy; Cholecystectomy; Arthroscopic Rotator Cuff Repair; Knee arthroscopy; and  Reverse right total shoulder arthroplasty. (Right, 08/25/2019).   Medications and Allergies  Current Medications  Current Outpatient Medications  Medication  Sig Dispense Refill  . acetaminophen  (TYLENOL ) 325 MG tablet Take 650 mg by mouth every 4 (four) hours as needed for Pain    . albuterol  90 mcg/actuation inhaler INHALE 1 INHALATION INTO THE LUNGS EVERY 6 (SIX) HOURS AS NEEDED FOR WHEEZING OR SHORTNESS OF BREATH 18 each 4  . albuterol  sulfate (VENTOLIN  HFA INHAL) Inhale 2 Puffs into the lungs every 6 (six) hours as needed (weezing)    . apixaban  (ELIQUIS ) 5 mg tablet Take 1 tablet (5 mg total) by mouth every 12 (twelve) hours 180 tablet 3  . blood glucose diagnostic, drum test strip Use 2 (two) times daily Use as instructed. 200 each 2  . cholecalciferol  (VITAMIN D3) 2,000 unit tablet Take 2,000 Units by mouth once daily    . clopidogreL  (PLAVIX ) 75 mg tablet Take 75 mg by mouth once daily    . diclofenac  (VOLTAREN ) 1 % topical gel Apply 2 g topically 4 (four) times daily    . DULoxetine  (CYMBALTA ) 60 MG DR capsule Take 1 capsule by mouth once daily    . ferrous sulfate  325 (65 FE) MG tablet TAKE 1 TABLET (325 MG TOTAL) BY MOUTH ONCE DAILY 30 tablet 4  . folic acid  (FOLVITE ) 1 MG tablet Take 1 mg by mouth once daily    . gabapentin  (NEURONTIN ) 300 MG capsule TAKE 1 CAPSULE BY MOUTH TWICE A DAY (Patient taking differently: Take 300 mg by mouth 3 (three) times daily) 60 capsule 4  . hydrocortisone 1 % cream Apply topically 2 (two) times daily    . levETIRAcetam  (KEPPRA ) 750 MG tablet Take 750 mg by mouth every 12 (twelve) hours    . levothyroxine  (SYNTHROID ) 100 MCG tablet  Take 100 mcg by mouth once daily Take on an empty stomach with a glass of water at least 30-60 minutes before breakfast.    . lisinopriL  (ZESTRIL ) 10 MG tablet Take 10 mg by mouth once daily    . magnesium  oxide (MAG-OX) 400 mg (241.3 mg magnesium ) tablet Take 400 mg by mouth once daily    . melatonin 10 mg Cap Take by mouth    . miscellaneous medical supply Misc WALL MOUNTED SHOWER RAIL. 1 each 0  . nystatin (MYCOSTATIN) 100,000 unit/gram powder Apply topically 2 (two) times  daily    . ondansetron  (ZOFRAN ) 4 MG tablet Take 4 mg by mouth every 6 (six) hours as needed for Nausea    . pantoprazole  (PROTONIX ) 40 MG DR tablet Take 40 mg by mouth once daily    . potassium chloride  (KLOR-CON  M20) 20 MEQ ER tablet Take 20 mEq by mouth once daily    . risperiDONE  (RISPERDAL ) 0.25 MG tablet Take 0.25 mg by mouth 2 (two) times daily    . atorvastatin  (LIPITOR ) 10 MG tablet Take 1 tablet (10 mg total) by mouth once daily for 180 days 90 tablet 1  . cyanocobalamin  (VITAMIN B12) 1000 MCG tablet Take 1 tablet (1,000 mcg total) by mouth once daily for 180 days 90 tablet 1  . FUROsemide  (LASIX ) 20 MG tablet Take 1 tablet (20 mg total) by mouth once daily for 180 days 90 tablet 1  . glipiZIDE -metFORMIN  (METAGLIP ) 5-500 mg tablet Take 1 tablet by mouth once daily for 180 days 90 tablet 1  . lisinopriL  (ZESTRIL ) 5 MG tablet Take 1 tablet (5 mg total) by mouth once daily for 180 days (Patient not taking: Reported on 12/04/2022) 90 tablet 1   No current facility-administered medications for this visit.    Allergies: Mushroom, Atenolol, Oxycodone-acetaminophen , Oxycodone-aspirin , Tramadol , Verapamil, Iodinated contrast media, and Morphine   Social and Family History  Social History  reports that she has been smoking cigarettes. She has a 25 pack-year smoking history. She has never used smokeless tobacco. She reports that she does not drink alcohol and does not use drugs.  Family History Family History  Problem Relation Name Age of Onset  . Heart disease Mother    . Heart disease Father    . High blood pressure (Hypertension) Father    . Heart disease Sister    . Heart disease Brother      Review of Systems   Pertinent positives and negatives are mentioned above in HPI and all other systems are negative.  Physical Examination   Vitals:BP 120/72   Pulse 87   Resp 16   Ht 154.9 cm (5' 1)   Wt 79.4 kg (175 lb)   LMP  (LMP Unknown)   SpO2 97%   BMI 33.07 kg/m  Ht:154.9  cm (5' 1) Wt:79.4 kg (175 lb) ADJ:Anib surface area is 1.85 meters squared. Body mass index is 33.07 kg/m.  HEENT: Pupils equally reactive to light and accomodation  Neck: Supple without thyromegaly, carotid pulses 2+ Lungs: clear to auscultation bilaterally; no wheezes, rales, rhonchi Heart: Regular rate and rhythm.  No gallops, murmurs or rub Abdomen: soft nontender, nondistended, with normal bowel sounds Extremities: no cyanosis, clubbing, or edema Peripheral Pulses: 2+ in all extremities, 2+ femoral pulses bilaterally Neurologic: Alert and oriented X3; speech intact; face symmetrical; moves all extremities well  Cardiovascular Studies:    Echocardiogram 2D complete:  NM Myocardial Perfusion SPECT multiple (stress and rest):  Cardiac Catheterization:   Holter:  Cardiac CT Scan:  Cardiac MRI: 11/09/024 IMPRESSION:  1. No acute intracranial abnormality.  2. Severe focal narrowing to near occlusion of the A2 segment of the  left ACA, unchanged.  3. Unchanged 10 mm extra-axial lesion along the superior sagittal  sinus on the right, favored to represent a meningioma.   Assessment   78 y.o. female with  1. SOB (shortness of breath)   2. Chronic obstructive pulmonary disease, unspecified COPD type (CMS/HHS-HCC)   3. Vascular dementia without behavioral disturbance (CMS/HHS-HCC)   4. Altered mental status, unspecified altered mental status type   5. Controlled type 2 diabetes mellitus with diabetic neuropathy, unspecified whether long term insulin  use (CMS/HHS-HCC)   6. TIA (transient ischemic attack)   7. Chronic diastolic CHF (congestive heart failure) (CMS/HHS-HCC)   8. Primary hypertension   9. Chronic a-fib (CMS/HHS-HCC)   10. Smoking     Plan   COPD, continue inhalers continue device refrain from tobacco abuse follow-up with pulmonary,  TIA, no residual deficits continue Plavix , statin, Eliquis   Hypertension, 120/72 today reasonably stable, continue diet and  exercise   A-fib, no recent a-fib activity, recent holter showed no signs of a-fib, continue Eliquis  Smoking, currently smokes, would advise tobacco cessation Obesity recommend modest weight loss exercise portion control Seizure disorder by history no recent seizure activity continue medical therapy GERD by history patient on Protonix  therapy for reflux type symptoms Diabetes reasonably controlled patient on glipizide  metformin  continue to follow diabetic diet with exercise  Return in about 6 months (around 08/18/2023).  This note is partially written by Leita Ellen, in the presence of and acting as the scribe of Dr. Cara Lovelace.      Leita Ellen  I have reviewed, edited and added to the note to reflect my best personal medical judgment.  Attestation Statement:   I personally performed the service. (TP)  DWAYNE JONETTA LOVELACE, MD  [facility] [place], [place] Ph:  [phone] Fax:  [phone] This note was generated in part with voice recognition software, Dragon.  I apologize for any typographical errors that were not detected and corrected from this process.  They are unintentional.

## 2023-05-04 ENCOUNTER — Other Ambulatory Visit: Payer: Self-pay

## 2023-05-04 ENCOUNTER — Emergency Department

## 2023-05-04 ENCOUNTER — Emergency Department
Admission: EM | Admit: 2023-05-04 | Discharge: 2023-05-04 | Disposition: A | Discharge status: Home / Self Care | Attending: Emergency Medicine | Admitting: Emergency Medicine

## 2023-05-04 DIAGNOSIS — W01190A Fall on same level from slipping, tripping and stumbling with subsequent striking against furniture, initial encounter: Secondary | ICD-10-CM | POA: Diagnosis not present

## 2023-05-04 DIAGNOSIS — E119 Type 2 diabetes mellitus without complications: Secondary | ICD-10-CM | POA: Diagnosis not present

## 2023-05-04 DIAGNOSIS — Y9301 Activity, walking, marching and hiking: Secondary | ICD-10-CM | POA: Diagnosis not present

## 2023-05-04 DIAGNOSIS — J0100 Acute maxillary sinusitis, unspecified: Secondary | ICD-10-CM | POA: Insufficient documentation

## 2023-05-04 DIAGNOSIS — Z8673 Personal history of transient ischemic attack (TIA), and cerebral infarction without residual deficits: Secondary | ICD-10-CM | POA: Insufficient documentation

## 2023-05-04 DIAGNOSIS — F0150 Vascular dementia without behavioral disturbance: Secondary | ICD-10-CM | POA: Insufficient documentation

## 2023-05-04 DIAGNOSIS — W19XXXA Unspecified fall, initial encounter: Secondary | ICD-10-CM

## 2023-05-04 DIAGNOSIS — I10 Essential (primary) hypertension: Secondary | ICD-10-CM | POA: Insufficient documentation

## 2023-05-04 DIAGNOSIS — M25562 Pain in left knee: Secondary | ICD-10-CM | POA: Insufficient documentation

## 2023-05-04 MED ORDER — ACETAMINOPHEN 500 MG PO TABS: 500.0000 mg | ORAL_TABLET | Freq: Four times a day (QID) | ORAL | 0 refills | Status: AC | PRN

## 2023-05-04 MED ORDER — AMOXICILLIN-POT CLAVULANATE 875-125 MG PO TABS
1.0000 | ORAL_TABLET | Freq: Once | ORAL | Status: AC
  Administered 2023-05-04: 1 via ORAL
  Filled 2023-05-04: qty 1

## 2023-05-04 MED ORDER — ACETAMINOPHEN 325 MG PO TABS
650.0000 mg | ORAL_TABLET | Freq: Once | ORAL | Status: AC
  Administered 2023-05-04: 650 mg via ORAL
  Filled 2023-05-04: qty 2

## 2023-05-04 MED ORDER — AMOXICILLIN-POT CLAVULANATE 875-125 MG PO TABS: 1.0000 | ORAL_TABLET | Freq: Two times a day (BID) | ORAL | 0 refills | Status: AC

## 2023-05-04 NOTE — ED Triage Notes (Signed)
 Pt comes via EMs from  [facility] with c/o trip and fall. Pt did hit her head and left knee. Pt not on thinners.

## 2023-05-04 NOTE — ED Provider Notes (Signed)
 [facility] Provider Note    Event Date/Time   First MD Initiated Contact with Patient 05/04/23 1740     (approximate)   History   Fall    HPI  Samantha Aguirre is a 78 y.o. female  with a past medical history of hypertension, diabetes, hypercholesterolemia, seizure disorder, chronic A-fib, vascular dementia, CVA with no significant past medical history who presents to the ED complaining of left knee pain.   According to the patient, she was walking with her walker and fell.  Patient is taking blood thinners.      Physical Exam   Triage Vital Signs: ED Triage Vitals  Encounter Vitals Group     BP 05/04/23 1415 96/61     Systolic BP Percentile --      Diastolic BP Percentile --      Pulse Rate 05/04/23 1415 72     Resp 05/04/23 1415 18     Temp 05/04/23 1415 98 F (36.7 C)     Temp Source 05/04/23 1657 Oral     SpO2 05/04/23 1415 99 %     Weight 05/04/23 1416 165 lb (74.8 kg)     Height 05/04/23 1416 5\' 1"  (1.549 m)     Head Circumference --      Peak Flow --      Pain Score 05/04/23 1414 10     Pain Loc --      Pain Education --      Exclude from Growth Chart --     Most recent vital signs: Vitals:   05/04/23 1415 05/04/23 1657  BP: 96/61 104/62  Pulse: 72 78  Resp: 18 16  Temp: 98 F (36.7 C) 98 F (36.7 C)  SpO2: 99% 99%     Constitutional: Alert, NAD. Able to speak in complete sentences without cough or dyspnea  Eyes: Conjunctivae are normal.  Head: Atraumatic. Face: Tender to palpation in the frontal area. Nose: No congestion/rhinnorhea. Mouth/Throat: Mucous membranes are moist.   Neck: Painless ROM. Supple. No JVD, nodes, thyromegaly  Cardiovascular:   Good peripheral circulation.RRR no murmurs, gallops, rubs  Respiratory: Normal respiratory effort.  No retractions. Clear to auscultation bilaterally without wheezing or crackles  Gastrointestinal: Soft and nontender.  Musculoskeletal:  no deformity Neurologic:  MAE  spontaneously. No gross focal neurologic deficits are appreciated.  Skin:  Skin is warm, dry and intact. No rash noted. Psychiatric: Mood and affect are normal. Speech and behavior are normal.    ED Results / Procedures / Treatments   Labs (all labs ordered are listed, but only abnormal results are displayed) Labs Reviewed - No data to display   EKG     RADIOLOGY I independently reviewed and interpreted imaging and agree with radiologists findings.      PROCEDURES:  Critical Care performed:   Procedures   MEDICATIONS ORDERED IN ED: Medications  amoxicillin -clavulanate (AUGMENTIN ) 875-125 MG per tablet 1 tablet (has no administration in time range)  acetaminophen  (TYLENOL ) tablet 650 mg (650 mg Oral Given 05/04/23 1811)   Clinical Course as of 05/04/23 1827  Mon May 04, 2023  1742 No evidence of an acute cervical spine fracture. 2. Levocurvature of the cervical spine. 3. Grade 1 spondylolisthesis at C5-C6, C7-T1 and T1-T2. 4. Cervical spondylosis as described.   [AE]  1743 CT HEAD WO CONTRAST ( ) [AE]  1743 CT HEAD WO CONTRAST ( ) 1.  No evidence of an acute intracranial abnormality. 2. 11 mm partially calcified meningioma along the right  aspect of the mid-to-posterior falx, unchanged from the prior head CT of 11/26/2022. 3. Parenchymal atrophy, chronic small vessel ischemic disease and chronic infarcts, as described. 4. Minor paranasal sinus disease. 5. Small volume fluid within right mastoid air cells.   [AE]  1743 DG Knee Complete 4 Views Left . Moderate medial compartment osteoarthritis, similar to prior. 2. Possible 11 mm loose body medial to the proximal posteromedial tibia.   [AE]  1810 According to DG knee complete 4 views left<there is a possible 11 mm loose body medial to the proximal posterior medial tibia.  Explained to the patient the need for a CT scan to confirm fracture, patient refused to have a CT scan.  Patient wants to have a  follow-up with orthopedics and have the CT scan at that time.  Patient will have acetaminophen  500 mg for pain, and prescription of antibiotics for sinus infection.  Patient agrees with the plan. [AE]    Clinical Course User Index [AE] Awilda Lennox, PA-C    IMPRESSION / MDM / ASSESSMENT AND PLAN / ED COURSE  I reviewed the triage vital signs and the nursing notes.  Differential diagnosis includes, but is not limited to, fracture, dislocation, knee effusion.  Pain is infection  Patient's presentation is most consistent with acute complicated illness / injury requiring diagnostic workup.   Patient's diagnosis is consistent with possible left knee fracture, sinus infection. I independently reviewed and interpreted imaging and agree with radiologists findings.  I did review the patient's allergies and medications.During admission patient received acetaminophen  500 mg and Augmentin  .  Patient refused to have CT of the left knee to confirm fracture, patient states she wants to have a follow-up with orthopedics and do the CT scan of the left knee at that time.  Patient states she is able to walk with her walker. The patient is in stable and satisfactory condition for discharge home  Patient will be discharged home with prescriptions for Augmentin , acetaminophen  500 mg. Patient is to follow up with orthopedics as needed or otherwise directed. Patient is given ED precautions to return to the ED for any worsening or new symptoms. Discussed plan of care with patient, answered all of patient's questions, Patient agreeable to plan of care. Advised patient to take medications according to the instructions on the label. Discussed possible side effects of new medications. Patient verbalized understanding.    FINAL CLINICAL IMPRESSION(S) / ED DIAGNOSES   Final diagnoses:  Fall, initial encounter  Acute pain of left knee  Acute maxillary sinusitis, recurrence not specified     Rx / DC Orders   ED  Discharge Orders          Ordered    amoxicillin -clavulanate (AUGMENTIN ) 875-125 MG tablet  2 times daily        05/04/23 1825    acetaminophen  (TYLENOL ) 500 MG tablet  Every 6 hours PRN        05/04/23 1825             Note:  This document was prepared using Dragon voice recognition software and may include unintentional dictation errors.   Awilda Lennox, PA-C 05/04/23 Samantha Aguirre    Samantha Galea, MD 05/04/23 2245

## 2023-05-04 NOTE — Discharge Instructions (Addendum)
 You have been diagnosed with left knee pain due to possible left knee fracture, sinus infection.  Please take Augmentin , 1 tablet by mouth every 12 hours after main meals for 7 days.  Please take Tylenol  1 tablet by mouth every 6 hours as needed for pain.  Please make an appointment with your PCP or with Dr. Daun Epstein for a follow-up of your possible knee fracture, and CT of the knee to confirm the fracture.  Come back to ED or go to your PCP if you have new symptoms or symptoms worsen

## 2023-05-04 NOTE — ED Triage Notes (Signed)
 Arrives from [facility] via [facility]>  Mechanical fall. Fall, hit head on door and hit left knee. C?O left knee pain.  No Thinners. No LOC.  VS WNL

## 2023-05-17 ENCOUNTER — Emergency Department
Admission: EM | Admit: 2023-05-17 | Discharge: 2023-05-17 | Disposition: A | Discharge status: Skilled Nursing Facility | Attending: Emergency Medicine | Admitting: Emergency Medicine

## 2023-05-17 ENCOUNTER — Emergency Department

## 2023-05-17 ENCOUNTER — Other Ambulatory Visit: Payer: Self-pay

## 2023-05-17 DIAGNOSIS — M25562 Pain in left knee: Secondary | ICD-10-CM | POA: Insufficient documentation

## 2023-05-17 DIAGNOSIS — I5030 Unspecified diastolic (congestive) heart failure: Secondary | ICD-10-CM | POA: Insufficient documentation

## 2023-05-17 DIAGNOSIS — I110 Hypertensive heart disease with heart failure: Secondary | ICD-10-CM | POA: Insufficient documentation

## 2023-05-17 DIAGNOSIS — R531 Weakness: Secondary | ICD-10-CM | POA: Diagnosis not present

## 2023-05-17 DIAGNOSIS — J449 Chronic obstructive pulmonary disease, unspecified: Secondary | ICD-10-CM | POA: Diagnosis not present

## 2023-05-17 DIAGNOSIS — I482 Chronic atrial fibrillation, unspecified: Secondary | ICD-10-CM | POA: Diagnosis not present

## 2023-05-17 DIAGNOSIS — I5032 Chronic diastolic (congestive) heart failure: Secondary | ICD-10-CM | POA: Insufficient documentation

## 2023-05-17 DIAGNOSIS — J45909 Unspecified asthma, uncomplicated: Secondary | ICD-10-CM | POA: Insufficient documentation

## 2023-05-17 DIAGNOSIS — Z85828 Personal history of other malignant neoplasm of skin: Secondary | ICD-10-CM | POA: Insufficient documentation

## 2023-05-17 DIAGNOSIS — F0150 Vascular dementia without behavioral disturbance: Secondary | ICD-10-CM | POA: Diagnosis not present

## 2023-05-17 DIAGNOSIS — Z7901 Long term (current) use of anticoagulants: Secondary | ICD-10-CM | POA: Insufficient documentation

## 2023-05-17 DIAGNOSIS — Z8673 Personal history of transient ischemic attack (TIA), and cerebral infarction without residual deficits: Secondary | ICD-10-CM | POA: Insufficient documentation

## 2023-05-17 DIAGNOSIS — E119 Type 2 diabetes mellitus without complications: Secondary | ICD-10-CM | POA: Insufficient documentation

## 2023-05-17 DIAGNOSIS — R29810 Facial weakness: Secondary | ICD-10-CM | POA: Diagnosis not present

## 2023-05-17 LAB — CBC WITH DIFFERENTIAL/PLATELET
Abs Immature Granulocytes: 0.08 10*3/uL — ABNORMAL HIGH (ref 0.00–0.07)
Basophils Absolute: 0 10*3/uL (ref 0.0–0.1)
Basophils Relative: 0 %
Eosinophils Absolute: 0.1 10*3/uL (ref 0.0–0.5)
Eosinophils Relative: 1 %
HCT: 38.2 % (ref 36.0–46.0)
Hemoglobin: 12.3 g/dL (ref 12.0–15.0)
Immature Granulocytes: 1 %
Lymphocytes Relative: 24 %
Lymphs Abs: 2.4 10*3/uL (ref 0.7–4.0)
MCH: 29.8 pg (ref 26.0–34.0)
MCHC: 32.2 g/dL (ref 30.0–36.0)
MCV: 92.5 fL (ref 80.0–100.0)
Monocytes Absolute: 0.7 10*3/uL (ref 0.1–1.0)
Monocytes Relative: 7 %
Neutro Abs: 6.8 10*3/uL (ref 1.7–7.7)
Neutrophils Relative %: 67 %
Platelets: 246 10*3/uL (ref 150–400)
RBC: 4.13 MIL/uL (ref 3.87–5.11)
RDW: 14 % (ref 11.5–15.5)
WBC: 10.1 10*3/uL (ref 4.0–10.5)
nRBC: 0 % (ref 0.0–0.2)

## 2023-05-17 LAB — COMPREHENSIVE METABOLIC PANEL WITH GFR
ALT: 11 U/L (ref 0–44)
AST: 12 U/L — ABNORMAL LOW (ref 15–41)
Albumin: 3.9 g/dL (ref 3.5–5.0)
Alkaline Phosphatase: 68 U/L (ref 38–126)
Anion gap: 9 (ref 5–15)
BUN: 20 mg/dL (ref 8–23)
CO2: 26 mmol/L (ref 22–32)
Calcium: 8.7 mg/dL — ABNORMAL LOW (ref 8.9–10.3)
Chloride: 104 mmol/L (ref 98–111)
Creatinine, Ser: 0.55 mg/dL (ref 0.44–1.00)
GFR, Estimated: >60 mL/min (ref >60)
Glucose, Bld: 132 mg/dL — ABNORMAL HIGH (ref 70–99)
Potassium: 3.7 mmol/L (ref 3.5–5.1)
Sodium: 139 mmol/L (ref 135–145)
Total Bilirubin: 0.5 mg/dL (ref 0.0–1.2)
Total Protein: 7 g/dL (ref 6.5–8.1)

## 2023-05-17 LAB — PROTIME-INR
INR: 1.1 (ref 0.8–1.2)
Prothrombin Time: 14.2 s (ref 11.4–15.2)

## 2023-05-17 LAB — CBG MONITORING, ED: Glucose-Capillary: 131 mg/dL — ABNORMAL HIGH (ref 70–99)

## 2023-05-17 LAB — APTT: aPTT: 29 s (ref 24–36)

## 2023-05-17 LAB — ETHANOL: Alcohol, Ethyl (B): <15 mg/dL (ref <15)

## 2023-05-17 NOTE — ED Notes (Signed)
 This RN to bedside due to patient stating she was leaving. Per EDP Vicenta Graft, pt cannot leave on her own. This RN confirmed Safe Transport en route for patient. This RN at bedside to speak with patient. Pt's daughter on phone, Cliffie [phone]), per Leary Provencal, pt will have to wait for transport, she is not available to come and pick patient up, nor is the person who usually picks patient up. This RN spoke with pt's daughter and updated her regarding transport. Pt tearful however appears to accept daughter's answer. Pt currently in room waiting for safe transport to transport back to her facility.

## 2023-05-17 NOTE — Progress Notes (Signed)
  Chaplain On-Call responded to Code Stroke notification at 1623 hours.  The patient was unavailable due to tests.  Chaplain assured Staff of availability as needed.  Chaplain Dean Every., [facility]

## 2023-05-17 NOTE — ED Notes (Signed)
 PT Cleared by MD and discharged. Departed via EMS. Departed back to assisted living home with no incidences. Daughter made aware.

## 2023-05-17 NOTE — Progress Notes (Signed)
 [facility] Telemedicine Consult   Requesting Provider: Ysidro Her Consult Participants: Patient, bedside RN Odilia Bennett, atrium RN Misty Location of the provider: [facility] Location of the patient: [place] emergency room  This consult was provided via telemedicine with 2-way video and audio communication. The patient/family was informed that care would be provided in this way and agreed to receive care in this manner.    Chief Complaint: Code Stroke  HPI: [name] is a pleasant 78 year old Caucasian lady with past medical history of hypertension, diabetes, hyper lipidemia, seizure disorder, chronic A-fib on anticoagulation with Eliquis , vascular dementia and remote stroke.  She was at a rehab facility where at 4 PM he was noted having facial droop and left upper extremity weakness.  EMS were called who called a code stroke en route.  Patient was examined by me and CT scan.  CT head showed no emergent acute abnormality and old remote left MCA branch infarct with mild changes of small vessel disease.  Patient denied any facial droop slurred speech or upper extremity weakness.  Her main complaint to me was left leg is weak and this began at about 2 PM.  She does admit that she had a fall 2 weeks ago and since then her left knee has been bothering her.  She was seen in the ER at [facility] and x-rays done and was discharged and is presently getting therapy.  She denies any seizures or seizure-like activity today.  She had a similar presentation somewhat with left-sided weakness and numbness in October 2024 and at that time MRI brain was negative for acute infarct.  Chronic infarct was noted in the left temporoparietal region, bilateral cerebellum and small 8 x 8 mm meningioma is noted.  She was also seen in the ER by neurology on 11/22/2022 for presumed seizure and was advised to increase Keppra  dose to 750 twice daily.Aaron Aas  MRI of the brain this visit was also negative for acute  abnormality.  MRA showed severe focal narrowing to near occlusion of the A2 segment of the left ACA but unchanged from before.  Stable appearance of 10 mm meningioma.    LKW:  ?  2 PM or 4 PM tnk given?: No, deficits to mild to treat and possibly not related to stroke IR Thrombectomy? No, clinical presentation not compatible with LVO Modified Rankin Scale: 2-Slight disability-UNABLE to perform all activities but does not need assistance Time of teleneurologist evaluation:  1635  Exam: There were no vitals filed for this visit.    05/04/2023    7:16 PM 05/04/2023    4:57 PM 05/04/2023    2:16 PM  Vitals with BMI  Height   5\' 1"   Weight   165 lbs  BMI   31.19  Systolic 109 104   Diastolic 67 62   Pulse 73 78     General:  Pleasant elderly Caucasian lady not in distress. She is awake alert oriented to time place and person.  Diminished attention registration recall.  Speech is clear without aphasia apraxia or dysarthria.  Extraocular movements are full range without nystagmus.  Visual fields seem adequate.  Face is symmetric without weakness.  Tongue midline.  Motor system exam shows symmetric upper extremity strength without focal weakness.  Slight subjective weakness of left lower extremity but able to lift off gravity but not to the same extent as the right side.  Sensation to well bilaterally.  Gait not tested 1A: Level of Consciousness - 0 1B: Ask Month  and Age - 0 1C: 'Blink Eyes' & 'Squeeze Hands' - 0 2: Test Horizontal Extraocular Movements - 0 3: Test Visual Fields - 0 4: Test Facial Palsy - 0 5A: Test Left Arm Motor Drift - 0 5B: Test Right Arm Motor Drift - 0 6A: Test Left Leg Motor Drift - 1 6B: Test Right Leg Motor Drift - 0 7: Test Limb Ataxia - 0 8: Test Sensation - 0 9: Test Language/Aphasia- 0 10: Test Dysarthria - 0 11: Test Extinction/Inattention - 0 NIHSS score: 1   Imaging Reviewed:  CT head noncontrast study personally reviewed shows no acute  abnormality.  Old left temporoparietal MCA branch infarct, bilateral cerebellar infarcts and moderate changes of small vessel disease  Labs reviewed in epic and pertinent values follow: Pending at this time   Assessment: 78 year old lady with transient facial droop and questionable left upper extremity weakness possibly right hemispheric subcortical hemispheric TIA.  Neurological exam shows resolution of the above deficits and mild subjective left leg weakness likely related to knee pain.  She does have remote history of strokes and has chronic atrial fibrillation and is on Eliquis  for anticoagulation.  Recommendations: Continue anticoagulation with Eliquis .  Patient is not a candidate for thrombolysis with TNK due to resolution of deficits i and minimum deficit in the left leg which may not be related to stroke.  Check MRI scan of the brain and if negative for acute stroke patient may be be discharged back.  Continue Eliquis  for stroke prevention and maintain aggressive risk factor modification. Kindly reconsult telestroke as patient has recurrence or worsening of neurological deficits. Discussed with Dr. Ysidro Her, ER, MD and answered questions.  This patient is receiving care for possible acute neurological changes. There was 55 minutes of care by this provider at the time of service, including time for direct evaluation via telemedicine, review of medical records, imaging studies and discussion of findings with providers, the patient and/or family.  Ardella Beaver MD [facility] [phone]  If 7pm- 7am, please page neurology on call as listed in AMION.

## 2023-05-17 NOTE — Consult Note (Addendum)
 1637 (Called by Dr Cleone Dad regarding EMS code stroke coming in). Telestroke RN logged into [place] stroke cart 1.  At this time, Dr Janett Medin already on cart and pt in CT.  Per report by EMS, LKW 1600.  Pt having L facial droop and L arm weakness.  Hx of prior CVA.  MRS2

## 2023-05-17 NOTE — ED Provider Notes (Signed)
 [facility] Provider Note    Event Date/Time   First MD Initiated Contact with Patient 05/17/23 1631     (approximate)   History   Chief Complaint: No chief complaint on file.   HPI  Samantha Aguirre is a 78 y.o. female with a history of atrial fibrillation, CHF COPD, hypertension, vascular dementia, diabetes who was reportedly in her usual state of health until 4:00 PM today when she had a sudden onset of left facial droop and left arm weakness.  Also complains of headache.  No fever or trauma.  Reportedly had a TIA 2 weeks ago.  EMS also reports that patient has been off her Eliquis  for the past week for unknown reasons.        Past Medical History:  Diagnosis Date   Acid reflux    Arthritis    Bilateral knees, hands, feet   Asthma    Atrial fibrillation (HCC)    Cancer (HCC)    CHF (congestive heart failure) (HCC)    Diastolic CHF   COPD (chronic obstructive pulmonary disease) (HCC)    Coronary artery disease    Diabetes mellitus without complication (HCC)    Frequent headaches    GI bleed    HLD (hyperlipidemia)    Hypertension    Seizures (HCC)    Skin cancer 2015   Suspected basal cell carcinoma on nose, surgically removed   Stroke (HCC) 04/2017   Thyroid  disease    Tremors of nervous system     Current Outpatient Rx   Order #: [id number] Class: Normal   Order #: [id number] Class: Normal   Order #: [id number] Class: Historical Med   Order #: [id number] Class: Historical Med   Order #: [id number] Class: Normal   Order #: [id number] Class: Historical Med   Order #: [id number] Class: No Print   Order #: [id number] Class: No Print   Order #: [id number] Class: OTC   Order #: [id number] Class: Historical Med   Order #: [id number] Class: Normal   Order #: [id number] Class: No Print   Order #: [id number] Class: Historical Med   Order #: [id number] Class: Normal   Order #: [id number] Class: Historical Med   Order #: [id number] Class: Historical Med   Order #:  [id number] Class: Historical Med   Order #: [id number] Class: Historical Med   Order #: [id number] Class: Historical Med   Order #: [id number] Class: Historical Med    Past Surgical History:  Procedure Laterality Date   ABDOMINAL HYSTERECTOMY  1976   APPENDECTOMY  1980   BACK SURGERY     cardiac stents     CAROTID ENDARTERECTOMY     CHOLECYSTECTOMY  1980   COLONOSCOPY N/A 02/20/2017   Procedure: COLONOSCOPY;  Surgeon: Selena Daily, MD;  Location: [facility] ENDOSCOPY;  Service: Gastroenterology;  Laterality: N/A;   ESOPHAGOGASTRODUODENOSCOPY N/A 02/20/2017   Procedure: ESOPHAGOGASTRODUODENOSCOPY (EGD);  Surgeon: Selena Daily, MD;  Location: [facility] ENDOSCOPY;  Service: Gastroenterology;  Laterality: N/A;   ESOPHAGOGASTRODUODENOSCOPY (EGD) WITH PROPOFOL  N/A 07/08/2017   Procedure: ESOPHAGOGASTRODUODENOSCOPY (EGD) WITH PROPOFOL ;  Surgeon: Marnee Sink, MD;  Location: [facility] ENDOSCOPY;  Service: Endoscopy;  Laterality: N/A;   GALLBLADDER SURGERY  1980   GIVENS CAPSULE STUDY N/A 07/09/2017   Procedure: GIVENS CAPSULE STUDY;  Surgeon: Luke Salaam, MD;  Location: [facility] ENDOSCOPY;  Service: Gastroenterology;  Laterality: N/A;   KNEE SURGERY     left   KYPHOPLASTY N/A 03/16/2018   Procedure: KYPHOPLASTY, L5;  Surgeon: Molli Angelucci, MD;  Location: [facility] ORS;  Service: Orthopedics;  Laterality: N/A;   REVERSE SHOULDER ARTHROPLASTY Right 08/25/2019  Procedure: REVERSE SHOULDER ARTHROPLASTY;  Surgeon: Elner Hahn, MD;  Location: [facility] ORS;  Service: Orthopedics;  Laterality: Right;   ROTATOR CUFF REPAIR     right   TONSILLECTOMY  1950    Physical Exam   Triage Vital Signs: ED Triage Vitals  Encounter Vitals Group     BP      Systolic BP Percentile      Diastolic BP Percentile      Pulse      Resp      Temp      Temp src      SpO2      Weight      Height      Head Circumference      Peak Flow      Pain Score      Pain Loc      Pain Education      Exclude from Growth Chart     Most  recent vital signs: There were no vitals filed for this visit.  General: Awake, no distress.  CV:  Good peripheral perfusion. rrr Resp:  Normal effort. ctab Abd:  No distention. Soft nt Other:  no facial droop, no upper extremity drift. No lower extremity drift, allowing for reduced effort on LLE due to knee pain from recent fall. Normal speech/language/orientation.     ED Results / Procedures / Treatments   Labs (all labs ordered are listed, but only abnormal results are displayed) Labs Reviewed  CBG MONITORING, ED - Abnormal; Notable for the following components:      Result Value   Glucose-Capillary 131 (*)    All other components within normal limits     EKG ***   RADIOLOGY ***   PROCEDURES:  .Critical Care  Performed by: Jacquie Maudlin, MD Authorized by: Jacquie Maudlin, MD   Critical care provider statement:    Critical care time (minutes):  35   Critical care time was exclusive of:  Separately billable procedures and treating other patients   Critical care was necessary to treat or prevent imminent or life-threatening deterioration of the following conditions:  CNS failure or compromise   Critical care was time spent personally by me on the following activities:  Development of treatment plan with patient or surrogate, discussions with consultants, evaluation of patient's response to treatment, examination of patient, obtaining history from patient or surrogate, ordering and performing treatments and interventions, ordering and review of laboratory studies, ordering and review of radiographic studies, pulse oximetry, re-evaluation of patient's condition and review of old charts   Care discussed with: admitting provider      MEDICATIONS ORDERED IN ED: Medications - No data to display   IMPRESSION / MDM / ASSESSMENT AND PLAN / ED COURSE  I reviewed the triage vital signs and the nursing notes.  DDx: Ischemic stroke, intracranial hemorrhage, electrolyte  derangement,  Patient's presentation is most consistent with acute presentation with potential threat to life or bodily function.  Patient presents with strokelike symptoms, left-sided weakness and facial droop.  Also has a right sided visual field deficit.  Code stroke initiated.  Will need CT angiogram to rule out LVO.  Chart lists a CT contrast allergy of itching on the abdomen and behind her ear, not clinically significant.  Can give concurrent Benadryl  and proceed with scan, will await neurology recommendation for whether to include perfusion study.   ----------------------------------------- 4:58 PM on 05/17/2023 ----------------------------------------- D/w neurology who notes pt provided a different history which involved LLE weak  since 2pm which pt attributes to pain in the knee from a recent fall. Exam otherwise reassuring for Neuro, stroke is doubtful. Will obtain MRI, plan to DC home if no worsening symptoms and MRI negative for acute cva.      FINAL CLINICAL IMPRESSION(S) / ED DIAGNOSES   Final diagnoses:  None     Rx / DC Orders   ED Discharge Orders     None        Note:  This document was prepared using Dragon voice recognition software and may include unintentional dictation errors.

## 2023-05-17 NOTE — ED Notes (Signed)
 Pt endorses wanting to go home. MD made aware and cleared pt. Transport contacted for pt's transport home an pts daughter made aware. Pt currently stating she is leaving the hospital without waiting for transport home. Pt walking around the room not complaint with nurses effort to safely handle her as we wait on transport. MD and charge nurse made aware.

## 2023-05-17 NOTE — ED Triage Notes (Signed)
 Pt brought in from [facility] ([facility]?) by EMS for complaints of left-sided facial droop and left arm drift. Per EMS, symptoms started 5 prior to their arrival (approx. 1600).

## 2023-08-19 ENCOUNTER — Encounter: Payer: Self-pay | Admitting: Specialist

## 2023-08-19 DIAGNOSIS — F17210 Nicotine dependence, cigarettes, uncomplicated: Secondary | ICD-10-CM

## 2023-08-19 DIAGNOSIS — R0602 Shortness of breath: Secondary | ICD-10-CM

## 2023-08-25 ENCOUNTER — Other Ambulatory Visit: Payer: Self-pay | Admitting: Specialist

## 2023-08-25 DIAGNOSIS — R0602 Shortness of breath: Secondary | ICD-10-CM

## 2023-08-25 DIAGNOSIS — F17210 Nicotine dependence, cigarettes, uncomplicated: Secondary | ICD-10-CM

## 2023-08-31 ENCOUNTER — Ambulatory Visit
Admission: RE | Admit: 2023-08-31 | Discharge: 2023-08-31 | Disposition: A | Discharge status: Home / Self Care | Source: Ambulatory Visit | Attending: Specialist | Admitting: Specialist

## 2023-08-31 DIAGNOSIS — F17210 Nicotine dependence, cigarettes, uncomplicated: Secondary | ICD-10-CM | POA: Diagnosis present

## 2023-08-31 DIAGNOSIS — R0602 Shortness of breath: Secondary | ICD-10-CM | POA: Diagnosis present

## 2023-09-15 ENCOUNTER — Emergency Department
Admission: EM | Admit: 2023-09-15 | Discharge: 2023-09-15 | Disposition: A | Discharge status: Other Acute Inpatient Hospital | Attending: Emergency Medicine | Admitting: Emergency Medicine

## 2023-09-15 ENCOUNTER — Other Ambulatory Visit: Payer: Self-pay

## 2023-09-15 DIAGNOSIS — Z85828 Personal history of other malignant neoplasm of skin: Secondary | ICD-10-CM | POA: Insufficient documentation

## 2023-09-15 DIAGNOSIS — J4489 Other specified chronic obstructive pulmonary disease: Secondary | ICD-10-CM | POA: Insufficient documentation

## 2023-09-15 DIAGNOSIS — F0390 Unspecified dementia without behavioral disturbance: Secondary | ICD-10-CM | POA: Insufficient documentation

## 2023-09-15 DIAGNOSIS — E119 Type 2 diabetes mellitus without complications: Secondary | ICD-10-CM | POA: Insufficient documentation

## 2023-09-15 DIAGNOSIS — I110 Hypertensive heart disease with heart failure: Secondary | ICD-10-CM | POA: Diagnosis not present

## 2023-09-15 DIAGNOSIS — I5032 Chronic diastolic (congestive) heart failure: Secondary | ICD-10-CM | POA: Insufficient documentation

## 2023-09-15 DIAGNOSIS — I5030 Unspecified diastolic (congestive) heart failure: Secondary | ICD-10-CM | POA: Insufficient documentation

## 2023-09-15 DIAGNOSIS — I2510 Atherosclerotic heart disease of native coronary artery without angina pectoris: Secondary | ICD-10-CM | POA: Diagnosis not present

## 2023-09-15 LAB — CBC WITH DIFFERENTIAL/PLATELET
Abs Immature Granulocytes: 0.02 K/uL (ref 0.00–0.07)
Basophils Absolute: 0.1 K/uL (ref 0.0–0.1)
Basophils Relative: 1 %
Eosinophils Absolute: 0.1 K/uL (ref 0.0–0.5)
Eosinophils Relative: 2 %
HCT: 36.7 % (ref 36.0–46.0)
Hemoglobin: 11.7 g/dL — ABNORMAL LOW (ref 12.0–15.0)
Immature Granulocytes: 0 %
Lymphocytes Relative: 29 %
Lymphs Abs: 2.4 K/uL (ref 0.7–4.0)
MCH: 29.1 pg (ref 26.0–34.0)
MCHC: 31.9 g/dL (ref 30.0–36.0)
MCV: 91.3 fL (ref 80.0–100.0)
Monocytes Absolute: 0.7 K/uL (ref 0.1–1.0)
Monocytes Relative: 8 %
Neutro Abs: 5.1 K/uL (ref 1.7–7.7)
Neutrophils Relative %: 60 %
Platelets: 297 K/uL (ref 150–400)
RBC: 4.02 MIL/uL (ref 3.87–5.11)
RDW: 13.9 % (ref 11.5–15.5)
WBC: 8.4 K/uL (ref 4.0–10.5)
nRBC: 0 % (ref 0.0–0.2)

## 2023-09-15 LAB — BASIC METABOLIC PANEL WITH GFR
Anion gap: 12 (ref 5–15)
BUN: 18 mg/dL (ref 8–23)
CO2: 27 mmol/L (ref 22–32)
Calcium: 9.4 mg/dL (ref 8.9–10.3)
Chloride: 99 mmol/L (ref 98–111)
Creatinine, Ser: 0.68 mg/dL (ref 0.44–1.00)
GFR, Estimated: >60 mL/min (ref >60)
Glucose, Bld: 166 mg/dL — ABNORMAL HIGH (ref 70–99)
Potassium: 3.4 mmol/L — ABNORMAL LOW (ref 3.5–5.1)
Sodium: 138 mmol/L (ref 135–145)

## 2023-09-15 NOTE — ED Triage Notes (Addendum)
 Pt arrives via EMS from [facility] assisted [facility]. Confusion that started today. Per EMS [facility] staff was not able to inform them of pt's mental status baseline. Firefighters on scene on EMS arrival. CGB 223 per fire. CBG 198 per EMS. Otherwise VSS. Hx of [facility] per [employer] staff.

## 2023-09-15 NOTE — ED Provider Notes (Signed)
 [facility] Provider Note    Event Date/Time   First MD Initiated Contact with Patient 09/15/23 1846     (approximate)   History   Chief Complaint: Altered Mental Status   HPI  Samantha Aguirre is a 78 y.o. female with a history of atrial fibrillation COPD diabetes and dementia who was brought to the ED due to appearing confused per assisted-living staff.  They are not sure what her baseline mental status is.  Patient denies any acute complaints.  No vomiting pain fever or other illness.  Reports that she feels well and wants to go home.        Past Medical History:  Diagnosis Date   Acid reflux    Arthritis    Bilateral knees, hands, feet   Asthma    Atrial fibrillation (HCC)    Cancer (HCC)    CHF (congestive heart failure) (HCC)    Diastolic CHF   COPD (chronic obstructive pulmonary disease) (HCC)    Coronary artery disease    Diabetes mellitus without complication (HCC)    Frequent headaches    GI bleed    HLD (hyperlipidemia)    Hypertension    Seizures (HCC)    Skin cancer 2015   Suspected basal cell carcinoma on nose, surgically removed   Stroke (HCC) 04/2017   Thyroid  disease    Tremors of nervous system     Current Outpatient Rx   Order #: [id number] Class: Normal   Order #: [id number] Class: Normal   Order #: [id number] Class: Historical Med   Order #: [id number] Class: Historical Med   Order #: [id number] Class: Normal   Order #: [id number] Class: Historical Med   Order #: [id number] Class: No Print   Order #: [id number] Class: No Print   Order #: [id number] Class: OTC   Order #: [id number] Class: Historical Med   Order #: [id number] Class: Normal   Order #: [id number] Class: No Print   Order #: [id number] Class: Historical Med   Order #: [id number] Class: Normal   Order #: [id number] Class: Historical Med   Order #: [id number] Class: Historical Med   Order #: [id number] Class: Historical Med   Order #: [id number] Class: Historical Med   Order #:  [id number] Class: Historical Med   Order #: [id number] Class: Historical Med    Past Surgical History:  Procedure Laterality Date   ABDOMINAL HYSTERECTOMY  1976   APPENDECTOMY  1980   BACK SURGERY     cardiac stents     CAROTID ENDARTERECTOMY     CHOLECYSTECTOMY  1980   COLONOSCOPY N/A 02/20/2017   Procedure: COLONOSCOPY;  Surgeon: Unk Corinn Skiff, MD;  Location: [facility] ENDOSCOPY;  Service: Gastroenterology;  Laterality: N/A;   ESOPHAGOGASTRODUODENOSCOPY N/A 02/20/2017   Procedure: ESOPHAGOGASTRODUODENOSCOPY (EGD);  Surgeon: Unk Corinn Skiff, MD;  Location[facility] ENDOSCOPY;  Service: Gastroenterology;  Laterality: N/A;   ESOPHAGOGASTRODUODENOSCOPY (EGD) WITH PROPOFOL  N/A 07/08/2017   Procedure: ESOPHAGOGASTRODUODENOSCOPY (EGD) WITH PROPOFOL ;  Surgeon: Jinny Carmine, MD;  Location: [facility] ENDOSCOPY;  Service: Endoscopy;  Laterality: N/A;   GALLBLADDER SURGERY  1980   GIVENS CAPSULE STUDY N/A 07/09/2017   Procedure: GIVENS CAPSULE STUDY;  Surgeon: Therisa Bi, MD;  Location: [facility] ENDOSCOPY;  Service: Gastroenterology;  Laterality: N/A;   KNEE SURGERY     left   KYPHOPLASTY N/A 03/16/2018   Procedure: KYPHOPLASTY, L5;  Surgeon: Kathlynn Sharper, MD;  Location: [facility] ORS;  Service: Orthopedics;  Laterality: N/A;   REVERSE SHOULDER ARTHROPLASTY Right 08/25/2019   Procedure: REVERSE SHOULDER ARTHROPLASTY;  Surgeon: Edie Norleen PARAS, MD;  Location: [facility] ORS;  Service:  Orthopedics;  Laterality: Right;   ROTATOR CUFF REPAIR     right   TONSILLECTOMY  1950    Physical Exam   Triage Vital Signs: ED Triage Vitals [09/15/23 1851]  Encounter Vitals Group     BP      Girls Systolic BP Percentile      Girls Diastolic BP Percentile      Boys Systolic BP Percentile      Boys Diastolic BP Percentile      Pulse      Resp      Temp      Temp src      SpO2      Weight      Height      Head Circumference      Peak Flow      Pain Score 0     Pain Loc      Pain Education      Exclude from Growth Chart      Most recent vital signs: Vitals:   09/15/23 1855  BP: 129/70  Pulse: 85  Resp: (!) 24  Temp: 98.2 F (36.8 C)  SpO2: 100%    General: Awake, no distress.  CV:  Good peripheral perfusion.  Regular rate rhythm Resp:  Normal effort.  Clear lungs Abd:  No distention.  Soft nontender Other:  Symmetric calf circumference, no calf tenderness.  Moist oral mucosa   ED Results / Procedures / Treatments   Labs (all labs ordered are listed, but only abnormal results are displayed) Labs Reviewed  BASIC METABOLIC PANEL WITH GFR - Abnormal; Notable for the following components:      Result Value   Potassium 3.4 (*)    Glucose, Bld 166 (*)    All other components within normal limits  CBC WITH DIFFERENTIAL/PLATELET - Abnormal; Notable for the following components:   Hemoglobin 11.7 (*)    All other components within normal limits     EKG Interpreted by me Sinus rhythm rate of 83.  Normal axis and intervals.  Normal QRS ST segments and T waves   RADIOLOGY    PROCEDURES:  Procedures   MEDICATIONS ORDERED IN ED: Medications - No data to display   IMPRESSION / MDM / ASSESSMENT AND PLAN / ED COURSE  I reviewed the triage vital signs and the nursing notes.  DDx: AKI, anemia, electrolyte derangement, fatigue, chronic dementia  Patient's presentation is most consistent with acute presentation with potential threat to life or bodily function.  Patient sent to the ED for evaluation due to suspected confusion.  Patient has disorientation, but denies any acute symptoms.  Suspect this is chronic.  Vital signs and exam are reassuring, serum labs unremarkable.  Patient request discharge.   Clinical Course as of 09/15/23 2324  Tue Sep 15, 2023  1933 Patient to be discharged.  She has no acute complaints, reassuring exam.  Serum labs unremarkable.  No evidence of trauma.  Stable for discharge. [PS]    Clinical Course User Index [PS] Viviann Pastor, MD     FINAL CLINICAL  IMPRESSION(S) / ED DIAGNOSES   Final diagnoses:  Chronic dementia (HCC)     Rx / DC Orders   ED Discharge Orders     None        Note:  This document was prepared using Dragon voice recognition software and may include unintentional dictation errors.   Viviann Pastor, MD 09/15/23 [phone]

## 2023-09-28 ENCOUNTER — Emergency Department
Admission: EM | Admit: 2023-09-28 | Discharge: 2023-09-28 | Disposition: A | Discharge status: Home / Self Care | Attending: Emergency Medicine | Admitting: Emergency Medicine

## 2023-09-28 ENCOUNTER — Other Ambulatory Visit: Payer: Self-pay

## 2023-09-28 DIAGNOSIS — I10 Essential (primary) hypertension: Secondary | ICD-10-CM | POA: Diagnosis not present

## 2023-09-28 DIAGNOSIS — F01511 Vascular dementia, unspecified severity, with agitation: Secondary | ICD-10-CM | POA: Diagnosis not present

## 2023-09-28 DIAGNOSIS — R456 Violent behavior: Secondary | ICD-10-CM | POA: Insufficient documentation

## 2023-09-28 DIAGNOSIS — I2510 Atherosclerotic heart disease of native coronary artery without angina pectoris: Secondary | ICD-10-CM | POA: Insufficient documentation

## 2023-09-28 DIAGNOSIS — R4689 Other symptoms and signs involving appearance and behavior: Secondary | ICD-10-CM

## 2023-09-28 DIAGNOSIS — J449 Chronic obstructive pulmonary disease, unspecified: Secondary | ICD-10-CM | POA: Diagnosis not present

## 2023-09-28 DIAGNOSIS — R45851 Suicidal ideations: Secondary | ICD-10-CM | POA: Insufficient documentation

## 2023-09-28 DIAGNOSIS — E119 Type 2 diabetes mellitus without complications: Secondary | ICD-10-CM | POA: Diagnosis not present

## 2023-09-28 LAB — URINALYSIS, ROUTINE W REFLEX MICROSCOPIC
Bilirubin Urine: NEGATIVE
Glucose, UA: NEGATIVE mg/dL
Hgb urine dipstick: NEGATIVE
Ketones, ur: NEGATIVE mg/dL
Leukocytes,Ua: NEGATIVE
Nitrite: NEGATIVE
Protein, ur: NEGATIVE mg/dL
Specific Gravity, Urine: 1.011 (ref 1.005–1.030)
pH: 5 (ref 5.0–8.0)

## 2023-09-28 LAB — URINE DRUG SCREEN, QUALITATIVE (ARMC ONLY)
Amphetamines, Ur Screen: NOT DETECTED
Barbiturates, Ur Screen: NOT DETECTED
Benzodiazepine, Ur Scrn: NOT DETECTED
Cannabinoid 50 Ng, Ur ~~LOC~~: NOT DETECTED
Cocaine Metabolite,Ur ~~LOC~~: NOT DETECTED
MDMA (Ecstasy)Ur Screen: NOT DETECTED
Methadone Scn, Ur: NOT DETECTED
Opiate, Ur Screen: NOT DETECTED
Phencyclidine (PCP) Ur S: NOT DETECTED
Tricyclic, Ur Screen: NOT DETECTED

## 2023-09-28 LAB — CBC
HCT: 36.4 % (ref 36.0–46.0)
Hemoglobin: 11.4 g/dL — ABNORMAL LOW (ref 12.0–15.0)
MCH: 29.1 pg (ref 26.0–34.0)
MCHC: 31.3 g/dL (ref 30.0–36.0)
MCV: 92.9 fL (ref 80.0–100.0)
Platelets: 279 K/uL (ref 150–400)
RBC: 3.92 MIL/uL (ref 3.87–5.11)
RDW: 13.7 % (ref 11.5–15.5)
WBC: 6.9 K/uL (ref 4.0–10.5)
nRBC: 0 % (ref 0.0–0.2)

## 2023-09-28 LAB — COMPREHENSIVE METABOLIC PANEL WITH GFR
ALT: 7 U/L (ref 0–44)
AST: 16 U/L (ref 15–41)
Albumin: 3.6 g/dL (ref 3.5–5.0)
Alkaline Phosphatase: 76 U/L (ref 38–126)
Anion gap: 11 (ref 5–15)
BUN: 13 mg/dL (ref 8–23)
CO2: 26 mmol/L (ref 22–32)
Calcium: 8.9 mg/dL (ref 8.9–10.3)
Chloride: 101 mmol/L (ref 98–111)
Creatinine, Ser: 0.72 mg/dL (ref 0.44–1.00)
GFR, Estimated: >60 mL/min (ref >60)
Glucose, Bld: 219 mg/dL — ABNORMAL HIGH (ref 70–99)
Potassium: 4.5 mmol/L (ref 3.5–5.1)
Sodium: 138 mmol/L (ref 135–145)
Total Bilirubin: 0.4 mg/dL (ref 0.0–1.2)
Total Protein: 6.5 g/dL (ref 6.5–8.1)

## 2023-09-28 LAB — ETHANOL: Alcohol, Ethyl (B): <15 mg/dL (ref <15)

## 2023-09-28 MED ORDER — MIRTAZAPINE 15 MG PO TBDP: 15.0000 mg | ORAL_TABLET | Freq: Every day | ORAL | 0 refills | Status: AC

## 2023-09-28 NOTE — Consult Note (Cosign Needed Addendum)
 [facility] Psychiatric Consult Initial  Patient Name: .Samantha Aguirre  MRN: [id number]  DOB: 10/03/1945  Consult Order details:  Orders (From admission, onward)     Start     Ordered   09/28/23 0913  IP CONSULT TO PSYCHIATRY       Ordering Provider: Willo Dunnings, MD  Provider:  (Not yet assigned)  Question:  Reason for consult:  Answer:  Medication management   09/28/23 0912   09/28/23 0913  CONSULT TO CALL ACT TEAM       Ordering Provider: Willo Dunnings, MD  Provider:  (Not yet assigned)  Question:  Reason for Consult?  Answer:  SI, aggressive behavior   09/28/23 0912             Mode of Visit: In person    Psychiatry Consult Evaluation  Service Date: September 28, 2023 LOS:  LOS: 0 days  Chief Complaint They took me for chemistry  Primary Psychiatric Diagnoses  Dementia with behavioral disturbance   Assessment  Samantha Aguirre is a 78 y.o. female admitted: Presented to the ED  Samantha Aguirre is a 78 y.o. female with past medical history of hypertension, diabetes, CAD, COPD, atrial fibrillation, stroke, and dementia who presents to the ED for suicidal ideation.  Per staff at patient's nursing facility, she has been increasingly aggressive over the past couple of weeks and has made threats of harming herself as well as staff at the facility.  She has reportedly been asking for sharp objects frequently with a desire to cut herself.  Patient unable to provide any history, is not sure why she is here.  She currently denies any complaints, denies any suicidal or homicidal ideation.  She has not had any recent falls or changes to her medications per staff at her facility.  Based on assessment, patient interview and chart review, patient current presentation is most consistent with her chronic diagnosis of vascular dementia with behavioral disturbance. Patient denies current SI/HI/AH/VH. Patient could not remember events leading up to being brought to the hospital,  nor could she remember making statements about wanting to harm self. Per ED nurses, no behavioral issues while patient present in the emergency department. Patient does not meet criteria for IVC at this time and unlikely to benefit from inpatient hospitalization due to dementia disease course and process. See HPI for detailed summary and below for medication recommendations.   Diagnoses:  Active Hospital problems: Active Problems:   * No active hospital problems. *    Plan   ## Psychiatric Medication Recommendations:  -Will provide prescription for Remeron  15 mg nightly to aide with sleep and mood. -Patient does not meet criteria for IVC or inpatient admission at this time. -Continue follow up with neurology for Parkinson/Vascular Dementia long term management  ## Medical Decision Making Capacity: Not specifically addressed in this encounter  ## Further Work-up:   -- most recent EKG on 09/15/2023 had QtC of 460 -- Pertinent labwork reviewed earlier this admission includes: CMP, Ethanol, CBC, Urine drug screen and urinalysis.    ## Disposition:-- There are no psychiatric contraindications to discharge at this time  ## Behavioral / Environmental: -Utilize compassion and acknowledge the patient's experiences while setting clear and realistic expectations for care.    ## Safety and Observation Level:  - Based on my clinical evaluation, I estimate the patient to be at low risk of self harm in the current setting. - At this time, we recommend  routine. This decision is based on my review  of the chart including patient's history and current presentation, interview of the patient, mental status examination, and consideration of suicide risk including evaluating suicidal ideation, plan, intent, suicidal or self-harm behaviors, risk factors, and protective factors. This judgment is based on our ability to directly address suicide risk, implement suicide prevention strategies, and develop a safety  plan while the patient is in the clinical setting. Please contact our team if there is a concern that risk level has changed.  CSSR Risk Category:C-SSRS RISK CATEGORY: No Risk  Suicide Risk Assessment: Patient has following modifiable risk factors for suicide: under treated depression , which we are addressing by providing prescription for Remeron  nightly for mood/sleep. Patient has following non-modifiable or demographic risk factors for suicide: N/A Patient has the following protective factors against suicide: Cultural, spiritual, or religious beliefs that discourage suicide, no history of suicide attempts, and no history of NSSIB, living in assisted living facility  Thank you for this consult request. Recommendations have been communicated to the primary team.  We will sign off at this time.   Zelda KATHEE Sharps, NP       History of Present Illness  Relevant Aspects of Hospital ED   Patient Report:  Patient is 78 year old female presenting from [facility] with past medical history of hypertension, diabetes, CAD, COPD, atrial fibrillation, stroke, and dementia who presents to the ED for suicidal ideation.  Per staff at patient's nursing facility, she has been increasingly aggressive over the past couple of weeks and has made threats of harming herself as well as staff at the facility.  She has reportedly been asking for sharp objects frequently with a desire to cut herself.  Patient unable to provide any history, is not sure why she is here.  She currently denies any complaints, denies any suicidal or homicidal ideation.  She has not had any recent falls or changes to her medications per staff at her facility.  On my assessment, patient is calm and cooperative and lying in bed with caregiver at bedside. Patient was alert to self, but disoriented to year and events that brought patient in. Patient was unable to remember events leading up to being brought to the emergency room and  reported she did not remember saying she needed to sharp objects to harm herself. On my assessment, patient denies suicidal thoughts. She did report some sadness when she realizes that she cannot remember things, but denies ever having suicidal thoughts or plan to harm self. She denied any history of any previous suicidal attempts. She denied HI/AH/VH. Patient reported she was compliant with current medication regimen.   She reported that she currently lives with her brother, however per chart review and staff report, patient currently resides at an assisted living facility. She denied any familial history of suicide. She reported her appetite is unchanged. She reported some trouble sleeping at night and staff endorsed patient waking up in the middle of the night as well.   She did endorse some flashbacks to events of childhood, but reported they occur rarely. 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 She denied any alcohol or illicit substance use in the present or past.   While patient has been in the ED, ED staff reported no behavioral incidents at this time. Per chart review, she is currently being seen by Dr. Maree for Parkinsonism and vascular dementia.   Psych ROS:  Depression: Endorsed some sadness related to not being able to remember things Anxiety:  Denied  Mania (lifetime and current): Denied Psychosis: (lifetime and current): Denied  Collateral information:  TTS Contacted Tammy at [phone] on 09/28/2023- reference note from TTS    Psychiatric and Social History  Psychiatric History:  Information collected from Patient and Tammy from [facility]  Prev Dx/Sx: Parkinson's, Vascular dementia Current Psych Provider: Unknown Home Meds (current): Cymbalta , gabapentin , risperidone, hydroxyzine, propranolol, kepra Previous Med Trials: See above Therapy: Denied current  Prior Psych Hospitalization: Denied  Prior Self Harm: Denied Prior Violence:  Denied  Family Psych History: Denied Family Hx suicide: Denied  Social History:    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   Substance History Alcohol: Denied  Type of alcohol Denied Last Drink Denied Number of drinks per day Denied History of alcohol withdrawal seizures Denied- noted history of seizure like activity in chart review- being treated by neurology and no recent seizures documented History of DT's Denied Tobacco: Denied Illicit drugs: Denied Prescription drug abuse: Denied Rehab hx: Denied  Exam Findings  Physical Exam: Deferred to EDP Vital Signs:  Temp:  [98 F (36.7 C)] 98 F (36.7 C) (09/15 0829) Pulse Rate:  [60] 60 (09/15 0829) Resp:  [18] 18 (09/15 0829) BP: (141)/(89) 141/89 (09/15 0829) SpO2:  [94 %] 94 % (09/15 0829) Weight:  [72.6 kg] 72.6 kg (09/15 0826) Blood pressure (!) 141/89, pulse 60, temperature 98 F (36.7 C), resp. rate 18, height 5' 1 (1.549 m), weight 72.6 kg, SpO2 94%. Body mass index is 30.23 kg/m.    Mental Status Exam: General Appearance: Fairly Groomed  Orientation:  Other:  Oriented to self and location  Memory:  Immediate;   Poor Recent;   Poor Remote;   Poor  Concentration:  Concentration: Poor and Attention Span: Poor  Recall:  Poor  Attention  Fair  Eye Contact:  Fair  Speech:  Normal Rate  Language:  Fair  Volume:  Normal  Mood: alright  Affect:  Appropriate  Thought Process:  Coherent  Thought Content:  WDL  Suicidal Thoughts:  No  Homicidal Thoughts:  No  Judgement:  Impaired- due to cognitive status  Insight:  Impaired due to cognitive status  Psychomotor Activity:  at baseline  Akathisia:  No  Fund of Knowledge:  Poor      Assets:  Housing Social Support  Cognition:  Impaired,  Severe  ADL's:  Impaired  AIMS (if indicated):        Other History   These have been pulled in through  the EMR, reviewed, and updated if appropriate.  Family History:  The patient's family history includes Alcohol abuse in her sister; Arthritis in her brother; Breast cancer in her mother; COPD in her mother; Cancer in her mother and sister; Diabetes in her brother, father, and mother; Drug abuse in her sister; Heart disease in her brother, father, maternal grandfather, mother, and paternal grandfather; Mental illness in her sister; Stroke in her father, mother, and sister.  Medical History: Past Medical History:  Diagnosis Date   Acid reflux    Arthritis    Bilateral knees, hands, feet   Asthma    Atrial fibrillation (HCC)    Cancer (HCC)    CHF (congestive heart failure) (HCC)    Diastolic CHF   COPD (chronic obstructive pulmonary disease) (HCC)    Coronary artery disease    Diabetes mellitus without complication (HCC)    Frequent headaches    GI  bleed    HLD (hyperlipidemia)    Hypertension    Seizures (HCC)    Skin cancer 2015   Suspected basal cell carcinoma on nose, surgically removed   Stroke (HCC) 04/2017   Thyroid  disease    Tremors of nervous system     Surgical History: Past Surgical History:  Procedure Laterality Date   ABDOMINAL HYSTERECTOMY  1976   APPENDECTOMY  1980   BACK SURGERY     cardiac stents     CAROTID ENDARTERECTOMY     CHOLECYSTECTOMY  1980   COLONOSCOPY N/A 02/20/2017   Procedure: COLONOSCOPY;  Surgeon: Unk Corinn Skiff, MD;  Location: [facility] ENDOSCOPY;  Service: Gastroenterology;  Laterality: N/A;   ESOPHAGOGASTRODUODENOSCOPY N/A 02/20/2017   Procedure: ESOPHAGOGASTRODUODENOSCOPY (EGD);  Surgeon: Unk Corinn Skiff, MD;  Location: [facility] ENDOSCOPY;  Service: Gastroenterology;  Laterality: N/A;   ESOPHAGOGASTRODUODENOSCOPY (EGD) WITH PROPOFOL  N/A 07/08/2017   Procedure: ESOPHAGOGASTRODUODENOSCOPY (EGD) WITH PROPOFOL ;  Surgeon: Jinny Carmine, MD;  Location: [facility] ENDOSCOPY;  Service: Endoscopy;  Laterality: N/A;   GALLBLADDER SURGERY  1980   GIVENS  CAPSULE STUDY N/A 07/09/2017   Procedure: GIVENS CAPSULE STUDY;  Surgeon: Therisa Bi, MD;  Location: [facility] ENDOSCOPY;  Service: Gastroenterology;  Laterality: N/A;   KNEE SURGERY     left   KYPHOPLASTY N/A 03/16/2018   Procedure: KYPHOPLASTY, L5;  Surgeon: Kathlynn Sharper, MD;  Location: [facility] ORS;  Service: Orthopedics;  Laterality: N/A;   REVERSE SHOULDER ARTHROPLASTY Right 08/25/2019   Procedure: REVERSE SHOULDER ARTHROPLASTY;  Surgeon: Edie Norleen PARAS, MD;  Location: [facility] ORS;  Service: Orthopedics;  Laterality: Right;   ROTATOR CUFF REPAIR     right   TONSILLECTOMY  1950     Medications:  No current facility-administered medications for this encounter.  Current Outpatient Medications:    acetaminophen  (TYLENOL ) 500 MG tablet, Take 1 tablet (500 mg total) by mouth every 6 (six) hours as needed., Disp: 30 tablet, Rfl: 0   albuterol  (PROVENTIL  HFA;VENTOLIN  HFA) 108 (90 Base) MCG/ACT inhaler, Inhale 2 puffs into the lungs every 6 (six) hours as needed for wheezing or shortness of breath., Disp: 3 Inhaler, Rfl: 1   atorvastatin  (LIPITOR ) 10 MG tablet, Take 10 mg by mouth daily., Disp: , Rfl:    Cholecalciferol  50 MCG (2000 UT) TABS, Take 2,000 Units by mouth daily., Disp: , Rfl:    clopidogrel  (PLAVIX ) 75 MG tablet, Take 1 tablet (75 mg total) by mouth daily., Disp: 30 tablet, Rfl: 0   diclofenac  Sodium (VOLTAREN ) 1 % GEL, Apply 2 g topically 3 (three) times daily., Disp: , Rfl:    DULoxetine  (CYMBALTA ) 60 MG capsule, Take 1 capsule (60 mg total) by mouth daily., Disp: , Rfl:    ferrous sulfate  325 (65 FE) MG EC tablet, Take 1 tablet (325 mg total) by mouth daily., Disp: , Rfl:    folic acid  (FOLVITE ) 1 MG tablet, Take 1 tablet (1 mg total) by mouth daily., Disp: , Rfl:    furosemide  (LASIX ) 20 MG tablet, Take 20 mg by mouth daily. , Disp: , Rfl:    gabapentin  (NEURONTIN ) 400 MG capsule, Take 1 capsule (400 mg total) by mouth 3 (three) times daily., Disp: 90 capsule, Rfl: 0   glipiZIDE -metformin   (METAGLIP ) 5-500 MG tablet, Take 1 tablet by mouth daily., Disp: , Rfl:    hydrocortisone cream 1 %, Apply 1 Application topically 2 (two) times daily as needed for itching., Disp: , Rfl:    levETIRAcetam  (KEPPRA ) 750 MG tablet, Take 1 tablet (750 mg  total) by mouth 2 (two) times daily., Disp: 60 tablet, Rfl: 1   levothyroxine  (SYNTHROID ) 100 MCG tablet, Take 125 mcg by mouth daily before breakfast., Disp: , Rfl:    lisinopril  (ZESTRIL ) 10 MG tablet, Take 10 mg by mouth daily., Disp: , Rfl:    Melatonin 10 MG TABS, Take 10 mg by mouth at bedtime., Disp: , Rfl:    nystatin (MYCOSTATIN/NYSTOP) powder, Apply 1 Application topically 2 (two) times daily as needed (irritation)., Disp: , Rfl:    omeprazole  (PRILOSEC) 40 MG capsule, Take 20 mg by mouth daily., Disp: , Rfl: 3   ondansetron  (ZOFRAN ) 4 MG tablet, Take 4 mg by mouth every 6 (six) hours as needed for nausea., Disp: , Rfl:   Allergies: Allergies  Allergen Reactions   Ivp Dye [Iodinated Contrast Media] Itching    Pt injected with IV contrast.  5 min after injection pt complained of itching behind ear and on her abd.   Methotrexate Rash   Morphine  And Codeine Other (See Comments)    stops my breathing NEAR-RESPIRATORY ARREST   Mushroom Extract Complex (Obsolete) Anaphylaxis    Made patient deathly sick   Atenolol Other (See Comments)    MD took me off of it bc it was doing something wrong Made patient HYPOTENSIVE   Percocet [Oxycodone-Acetaminophen ] Nausea And Vomiting and Other (See Comments)    Stomach pains   Percodan [Oxycodone-Aspirin ] Nausea And Vomiting and Other (See Comments)    Stomach pains   Tramadol  Nausea Only   Verapamil Other (See Comments)     MD told me this was screwing me up MAKES PATIENT HYPOTENSIVE   Aspirin     Codeine    Oxycodone Hcl     Kymari Nuon B Iven Earnhart, NP

## 2023-09-28 NOTE — ED Triage Notes (Addendum)
 Pt comes via POV from [facility] with c/o SI statements per the staff. Pt has been asking for sharp objects to harm herself. Pt has also tied some pants around her neck. Pt has hx of dementia.  Pt denies any SI thoughts or trying to hurt herself. Pt denies any HI. Pt states Im not even trying to hurt myself.   Pt is calm and cooperative. Pt has been dx with UTI and on meds currently for it per staff.

## 2023-09-28 NOTE — BH Assessment (Signed)
 Writer spoke to [place], Advice worker at [employer]. Tammy reports patient is not at her baseline. She states patient has been combative, cursing, leaving the facility wandering out in the street, threatening staff and to harm herself. Tammy says patient is usually able to dress herself and use the bathroom on her own but now she unable to perform ADL's. Tammy reports patient was seen at [facility] on 09/15/23 but discharged with medications for a UTI in which she has completed. Tammy says patient was given medication for agitation but it is not working. Nothing seems to work. Tammy reports patient usually smokes but she has not had a cigarette in a week and she says patient's potassium and hemoglobin are low and her blood sugar levels are high.    Tammy also mentioned patient was diagnosed with Parkinson in May 2025.

## 2023-09-28 NOTE — ED Notes (Addendum)
 Pt dressed out into hospital attire. Pt belongings to include: 1 green shirt 1 leopard print pants 2 gray shoes 1 blue hairbow

## 2023-09-28 NOTE — ED Provider Notes (Addendum)
 [facility] Provider Note    Event Date/Time   First MD Initiated Contact with Patient 09/28/23 [phone]     (approximate)   History   Chief Complaint SI   HPI  Samantha Aguirre is a 78 y.o. female with past medical history of hypertension, diabetes, CAD, COPD, atrial fibrillation, stroke, and dementia who presents to the ED for suicidal ideation.  Per staff at patient's nursing facility, she has been increasingly aggressive over the past couple of weeks and has made threats of harming herself as well as staff at the facility.  She has reportedly been asking for sharp objects frequently with a desire to cut herself.  Patient unable to provide any history, is not sure why she is here.  She currently denies any complaints, denies any suicidal or homicidal ideation.  She has not had any recent falls or changes to her medications per staff at her facility.     Physical Exam   Triage Vital Signs: ED Triage Vitals  Encounter Vitals Group     BP 09/28/23 0829 (!) 141/89     Girls Systolic BP Percentile --      Girls Diastolic BP Percentile --      Boys Systolic BP Percentile --      Boys Diastolic BP Percentile --      Pulse Rate 09/28/23 0829 60     Resp 09/28/23 0829 18     Temp 09/28/23 0829 98 F (36.7 C)     Temp src --      SpO2 09/28/23 0829 94 %     Weight 09/28/23 0826 160 lb (72.6 kg)     Height 09/28/23 0826 5' 1 (1.549 m)     Head Circumference --      Peak Flow --      Pain Score 09/28/23 0826 0     Pain Loc --      Pain Education --      Exclude from Growth Chart --     Most recent vital signs: Vitals:   09/28/23 0829  BP: (!) 141/89  Pulse: 60  Resp: 18  Temp: 98 F (36.7 C)  SpO2: 94%    Constitutional: Alert and oriented to person and place, but not time or situation. Eyes: Conjunctivae are normal. Head: Atraumatic. Nose: No congestion/rhinnorhea. Mouth/Throat: Mucous membranes are moist.  Cardiovascular: Normal rate,  regular rhythm. Grossly normal heart sounds.  2+ radial pulses bilaterally. Respiratory: Normal respiratory effort.  No retractions. Lungs CTAB. Gastrointestinal: Soft and nontender. No distention. Musculoskeletal: No lower extremity tenderness nor edema.  Neurologic:  Normal speech and language. No gross focal neurologic deficits are appreciated.    ED Results / Procedures / Treatments   Labs (all labs ordered are listed, but only abnormal results are displayed) Labs Reviewed  COMPREHENSIVE METABOLIC PANEL WITH GFR - Abnormal; Notable for the following components:      Result Value   Glucose, Bld 219 (*)    All other components within normal limits  CBC - Abnormal; Notable for the following components:   Hemoglobin 11.4 (*)    All other components within normal limits  URINALYSIS, ROUTINE W REFLEX MICROSCOPIC - Abnormal; Notable for the following components:   Color, Urine YELLOW (*)    APPearance CLEAR (*)    All other components within normal limits  ETHANOL  URINE DRUG SCREEN, QUALITATIVE ([facility] ONLY)    PROCEDURES:  Critical Care performed: No  Procedures   MEDICATIONS ORDERED IN  ED: Medications - No data to display   IMPRESSION / MDM / ASSESSMENT AND PLAN / ED COURSE  I reviewed the triage vital signs and the nursing notes.                              78 y.o. female with past medical history of hypertension, diabetes, CAD, atrial fibrillation, COPD, stroke, and dementia who presents to the ED for increasing aggressive behavior as well as suicidal ideation over the past couple of weeks.  Patient's presentation is most consistent with acute presentation with potential threat to life or bodily function.  Differential diagnosis includes, but is not limited to, anemia, electrolyte abnormality, AKI, UTI, medication effect, psychosis, depression, anxiety, dementia.  Patient nontoxic-appearing and in no acute distress, vital signs are unremarkable.  She has no  complaints and has a nonfocal neurologic exam, appears to be at her baseline mental status.  Labs without significant anemia, leukocytosis, electrolyte abnormality, or AKI.  LFTs are unremarkable, ethanol level undetectable.  Urinalysis pending at this time, but patient completed a course of antibiotics for UTI earlier this month with no change in behavior.  We will consult psychiatry given increasing aggression and suicidal ideation.  Labs without significant anemia, leukocytosis, electrolyte abnormality, or AKI.  Urinalysis with no signs of infection and patient may be medically cleared for psychiatric disposition.  The patient has been placed in psychiatric observation due to the need to provide a safe environment for the patient while obtaining psychiatric consultation and evaluation, as well as ongoing medical and medication management to treat the patient's condition.  The patient has not been placed under full IVC at this time.  Patient evaluated by psychiatry and cleared for discharge home.  Psychiatry team prescribed Remeron  for mood stabilization and caregiver at bedside counseled to have patient follow-up with her PCP, otherwise return to the ED for new or worsening symptoms.       FINAL CLINICAL IMPRESSION(S) / ED DIAGNOSES   Final diagnoses:  Aggressive behavior  Vascular dementia with agitation, unspecified dementia severity (HCC)  Suicidal ideation     Rx / DC Orders   ED Discharge Orders     None        Note:  This document was prepared using Dragon voice recognition software and may include unintentional dictation errors.   Willo Dunnings, MD 09/28/23 1021    Willo Dunnings, MD 09/28/23 [phone]

## 2023-09-28 NOTE — BH Assessment (Signed)
 Comprehensive Clinical Assessment (CCA) Screening, Triage and Referral Note  09/28/2023 Samantha Aguirre [id number]  Samantha Aguirre, 78 year old female who presents to [facility] ED voluntarily for treatment. Per triage note, Pt comes via POV from [facility] with c/o SI statements per the staff. Pt has been asking for sharp objects to harm herself. Pt has also tied some pants around her neck. Pt has hx of dementia. Pt denies any SI thoughts or trying to hurt herself. Pt denies any HI. Pt states Im not even trying to hurt myself. Pt is calm and cooperative. Pt has been dx with UTI and on meds currently for it per staff.   During TTS assessment pt presents alert and oriented x 2, restless but cooperative, and mood-congruent with affect. The pt does not appear to be responding to internal or external stimuli. Neither is the pt presenting with any delusional thinking. Pt verified the information provided to triage RN.   Pt identifies her main complaint to be that she is not sure why she was brought to the ED. Patient presents lying in bed with caregiver at bedside. Patient denies allegations of wanting to harm or kill herself or anyone else. Patient is calm and cooperative. Patient reports no prior attempts and states she is compliant with her medications. Patient says she is eating at least 2 meals per day, her sleep is "okay" but says she has some trouble waking up during the night. 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 Patient denies using any illicit substances and alcohol. Pt reports no family hx of MH/SA. Pt denies current SI/HI/AH/VH. Pt contracts for safety.    Per Zelda, NP, pt does not meet criteria for inpatient psychiatric admission.  Chief Complaint:  Chief Complaint  Patient presents with   SI   Visit Diagnosis: Dementia with behavioral disturbance  Patient Reported Information How did you hear about us ? -- (SNF)  What Is the Reason  for Your Visit/Call Today? Patient reports she is not sure why she was brought to the ED.  How Long Has This Been Causing You Problems? > than 6 months  What Do You Feel Would Help You the Most Today? -- (Assessment only)   Have You Recently Had Any Thoughts About Hurting Yourself? No  Are You Planning to Commit Suicide/Harm Yourself At This time? No   Have you Recently Had Thoughts About Hurting Someone [place]? No  Are You Planning to Harm Someone at This Time? No  Explanation: No data recorded  Have You Used Any Alcohol or Drugs in the Past 24 Hours? No  How Long Ago Did You Use Drugs or Alcohol? No data recorded What Did You Use and How Much? No data recorded  Do You Currently Have a Therapist/Psychiatrist? No  Name of Therapist/Psychiatrist: No data recorded  Have You Been Recently Discharged From Any Office Practice or Programs? No  Explanation of Discharge From Practice/Program: No data recorded   CCA Screening Triage Referral Assessment Type of Contact: Face-to-Face  Telemedicine Service Delivery:   Is this Initial or Reassessment?   Date Telepsych consult ordered in [facility]:    Time Telepsych consult ordered in [facility]:    Location of Assessment: [facility] ED  Provider Location: [facility] ED    Collateral Involvement: [facility] [phone]   Does Patient Have a Court Appointed Legal Guardian? No data recorded Name and Contact of Legal Guardian: No data recorded If Minor and Not Living with Parent(s),  Who has Custody? No data recorded Is CPS involved or ever been involved? No data recorded Is APS involved or ever been involved? No data recorded  Patient Determined To Be At Risk for Harm To Self or Others Based on Review of Patient Reported Information or Presenting Complaint? No  Method: No Plan  Availability of Means: No access or NA  Intent: Vague intent or NA  Notification Required: No need or identified person  Additional Information for  Danger to Others Potential: No data recorded Additional Comments for Danger to Others Potential: No data recorded Are There Guns or Other Weapons in Your Home? No  Types of Guns/Weapons: No data recorded Are These Weapons Safely Secured?                            No data recorded Who Could Verify You Are Able To Have These Secured: No data recorded Do You Have any Outstanding Charges, Pending Court Dates, Parole/Probation? No data recorded Contacted To Inform of Risk of Harm To Self or Others: No data recorded  Does Patient Present under Involuntary Commitment? No    [place] of Residence: [place]   Patient Currently Receiving the Following Services: Skilled Nursing Facility; Medication Management; Individual Therapy   Determination of Need: Emergent (2 hours)   Options For Referral: ED Visit; ALF/SNF; Medication Management   Disposition Recommendation per psychiatric provider: Per Zelda, NP, pt does not meet criteria for inpatient psychiatric admission.  Samantha Aguirre, Counselor, LCAS-A

## 2023-09-28 NOTE — ED Notes (Signed)
 Pt brought in from [facility] for evaluation. Per Staff, pt has been move aggressive. Staff reports pt has been trying to harm herself by putting pants around her neck and that she also tried to get into the kitchen and get a knife. Per staff report pt has been cursing at staff and attempting to hit staff. Pt is currently being treated for UTI.  Pt denies that she has attempted to harm herself or others. Pt states that she does not hear or see things that are not there. Pt is calm and cooperative at this time. Pt does states that she has past hx/o depression and anxiety.

## 2023-10-13 NOTE — Progress Notes (Signed)
 Ref Provider: Evern Allyson Dotter* PCP: Sadie Tamra Cal, MD Assessment and Plan:   In most patients we give written parts of assessment and plan to patient under Patient Instructions/After Visit Summary. So some parts are directed to patient.  Dear [name], It was our pleasure to participate in your care. We have typed up brief summary of what we discussed. Assessment & Plan Behavioral DIsturbances in patient with history of vascular dementia and parkinsonism  Worsening behavioral disturbances with aggression, hostility, and hallucinations. Patient had positive UTI when symptoms started. UTI treated but cognitive effects persist. Patient started on risperidone however there has been Insufficient response to current risperidone dosage - Increase risperidone to 0.5 mg twice daily.  Parkinsonism Parkinsonism with unilateral upper extremity resting tremor, shuffling gait, hunched posture, bradykinesia, and multiple falls. Improvement with carbidopa-levodopa, reduced tremors, and improved ambulation. No falls since last visit. - Continue carbidopa-levodopa 25/100 mg, one tablet three times daily. - Ensure home health therapies including physical and occupational therapy. - Reorder speech therapy if necessary.  Vascular Dementia Vascular dementia managed with home health consults. - Continue home health consults with physical and occupational therapy. - Aggressive vascular risk control   Seizure-like activity No seizure-like symptoms since last visit. Managed with Keppra  and Gabapentin . - Continue Keppra  500 mg orally twice daily. - Continue Gabapentin  400 mg orally three times daily.  Follow up in one month with Allyson Evern, FNP-BC  Return in about 1 month (around 11/12/2023) for Allyson Evern, FNP-BC.  This was an urgent visit, requiring modification of my schedule, so patient doesn't have to go to urgent care or ER.  Interim History date 10/13/2023    [name] is a 78 y.o. female here for treatment and evaluation of Parkinsonism.  [name] last visit was on 07/01/2023  Samantha Aguirre is a 78 year old female with Parkinsonism, vascular dementia, and seizure-like activity who presents with worsening behavioral disturbances. She is accompanied by her caregiver.  She has a history of Parkinsonism, vascular dementia, and seizure-like activity, managed with carbidopa-levodopa 25/100 mg one tablet three times a day, Keppra  500 mg twice daily, and gabapentin  400 mg orally three times a day. Despite this regimen, her symptoms have worsened over the past month to month and a half, with increased behavioral disturbances and hallucinations.  She resides in a care facility, which has reported attempts to leave the facility, physical aggression towards staff, and medication non-compliance. On September 28, 2023, she was taken to the emergency room due to increased aggression and threats of self-harm, including asking for sharp objects to cut herself. She was admitted to a psychiatric observation unit but denied any suicidal or homicidal ideations and did not recall the events leading to her hospital admission. She was discharged with a recommendation for Remeron  50 mg nightly.  A urinary analysis conducted during her hospital stay showed no evidence of a urinary tract infection, and other lab results were unremarkable. However, she had a UTI on September 15, 2023, which was treated, and a follow-up test confirmed it was cleared. Her mother reports that her symptoms began to worsen around the time of the UTI, with increased behavioral disturbances and hallucinations, including seeing deceased family members.  Her caretaker notes that prior to these events, she was able to dress herself and use the bathroom independently, but these abilities have declined. She has been experiencing auditory and visual hallucinations, such as hearing voices and seeing  people who are not present. Her caretaker also reports  that she has been lying on the floor and engaging in unusual behaviors  She was previously on risperidone and lorazepam , which provided limited relief. She has no history of psychiatric conditions like bipolar disorder or schizophrenia. Her mother reports that she had a low hemoglobin, low potassium, and high blood sugar levels during a previous hospital visit, and she was taken off metformin  at that time.  A CT scan of the chest was performed in August, and she also had neuroimaging that showed advanced chronic microvascular ischemic disease and multiple old strokes, as reported by her caregiver. Her mother is concerned about the impact of these conditions on her cognition and behavior.  Interim History date 07/01/2023   [name] is a 78 y.o. female here for treatment and evaluation of Tremors  [name] last visit was on 06/03/2023  History of Present Illness Samantha Aguirre is a 78 year old female with parkinsonism and vascular dementia who presents for follow-up management.  She has experienced improvement in tremors since starting carbidopa-levodopa 25/100 mg, one tablet three times a day, initiated during her last visit on Jun 03, 2023. There are no recent falls or increase in tremor severity.  She is receiving physical and occupational therapy through a home health consult for vascular dementia. She has not received speech therapy.  She is currently taking Keppra  500 mg twice a day and gabapentin  400 mg three times a day for seizure-like activity. There are no recent seizure-like symptoms.  Interim History date 06/03/2023   [name] is a 78 y.o. female here for treatment and evaluation of Tremors   [name] last visit was on 04/08/2023  Patient daughter states her memory is worse.  She does not recognize family members.  Taking Keppra  500 mg two times a day and gabapentin  400 mg three times a day.     Patient was [facility] at [facility] Room on 05/04/2023 after a fall and again on 05/17/2023 due to left upper extremity weakness and facial droop.    MRI Brain 05/17/2023 - 1. No evidence of acute intracranial abnormality. 2. Similar advanced chronic microvascular ischemic disease and multiple remote infarcts, detailed above.  3. Similar probable 1 cm meningioma along the high right frontoparietal convexity. No mass effect or brain edema.   Head CT 05/17/2023 - 1. No acute intracranial abnormality or significant interval change. 2. Stable remote encephalomalacia in the posterior left temporal and occipital lobe. 3. Stable advanced periventricular white matter disease bilaterally. This likely reflects the sequela of chronic microvascular ischemia. 4. Stable remote lacunar infarcts of the cerebellum bilaterally. 5. Aspects is 10/10.   Interim History date 04/08/2023   [name] is a 78 y.o. female here for treatment and evaluation of Tremors, accompanied by her caregiver.   [name] last visit was on 12/04/2022  Patient denies any seizure like activity since the last visit. Patient denies any changes in her memory and reports it has been stable. Patient does report numbness and tingling in her shoulder that radiates down to her arm. She also reports continued numbness and tingling in her lower extremities.   CT Cervical Spine (C-Spine) 11/26/2022 - 1. No evidence of acute intracranial abnormality or cervical spine  fracture. 2. Left occipital scalp hematoma. 3. Extensive chronic ischemia with multiple old infarcts as above.   Interim History date 12/04/2022   [name] is a 78 y.o. female here for treatment and evaluation of Tremors, unaccompanied.   [name] last visit was  on 09/09/2022  Patient's caretaker reports no new stroke like symptoms. However, patient did have an ED visit on 11/22/2022 for weakness of left hand (not included below).  Patient does state that she fell about a week ago on 11/26/2022 and she did hit her head and hit the floor. They did take her to [facility] for an evaluation.   Patient reports no changes in migraines. She reports about 2-3 a week. She takes Tylenol  which does help some but does not make it fully go away.   Patient did have an EEG done on 11/21/2022 but has not been resulted. No report seizure like activity since last visit.   Patient is taking Gabapentin  300 mg TID with no reported side effects. Symptoms in lower extremities have stayed the same.   Patient had labs drawn 11/22/2022 (CBG, CBC, Ethanol, CMP)  Patient had ED visit on 11/26/2022 for a fall. Per note,  Samantha Aguirre is a 78 y.o. female with past medical history of hypertension, hyperlipidemia, diabetes, CHF, atrial fibrillation on Eliquis , stroke, seizures, and hypothyroidism who presents to the ED following fall. Per EMS, patient had a fall outside of her assisted living facility this morning where she went backwards and struck her head on the pavement. She believes she lost her balance, falling backwards and striking her head. She denies losing consciousness, does complain of headache to the left side of her head, but denies any neck pain. She also denies any chest pain, abdominal pain, or extremity pain. She currently takes both Eliquis  and Plavix .   Patient had Ed visit on 11/22/2022 for weakness in left hand. Per note, Samantha Aguirre is a 78 y.o. female who reports that around 7:30 AM she started feeling weak in the left arm and left leg. Very similar to something that happened a month or 2 ago   Interim History: 09/25/2021  Ms. Yepiz is a 78 y.o. female here for follow up of Tremors   Since her last visit on 12/05/2015 with us , Samantha Aguirre moved into [facility] in February 2023. Previously lived with family. Patient notes she is doing better since the move.   Seizure-like activity, worse after coming off Anti  Seizure Medication.  - Spoke to facility on the phone. They state that patient has been having mini strokes and has had multiple hospital encounters. Patient was put on Keppra  750 mg two times a day. Patient feels her balance has been off since staring this dose.     Routine Sleep-deprived EEG - mild left temporal theta slowing   Medications: Keppra , Gabapentin    TIA - presenting with facial droop, dysarthria, MRI Brain negative for acute stroke - in patient with history of remote  Emergency room 04/22/2022 for fall.  Emergency room on 05/09/2022 for unilateral facial droop and dysarthria.  MRI brain and CT head were nonacute.  Patient was started on aspirin  and Plavix  for 3 weeks, followed by Plavix  as monotherapy alone.  She was on Plavix  remotely.  Has not taken this since 2022.  Has history of multiple chronic infarcts.  Since event, has been doing well.  No new focal neurologic symptoms.  No subsequent falls.  Denies breakthrough seizure.  Has 24/7 supervision.   [name] 05/09/2022 IMPRESSION: No evidence of acute abnormality. Remote infarcts, detailed above.   MR Angio Head wo contrast 05/09/2022 No large vessel occlusion. Possible moderate left PCA stenosis, but motion limits assessment.   Mr Angio neck w wo contrast 05/09/2022 No visible hemodynamically  significant stenosis.   Mild to moderate vascular dementia, onset 2018, in patient with history of multiple lacunar infarcts bilaterally, left parietal and occipital encephalomalacia, coronary artery disease, history of migraines - worse   Assistance care coordinator states she was having issues with her memory before she came to facility.  Patient states she started noticing a decline in her memory around 2018 or so. She has issues with her short term memory.  She no longer drives. No longer manages her medications or finances. She denies needing assistance with any Activities of Daily Living.  No hallucinations.  Not on any  memory medications. She is unaccompanied by any family today.    Patient has been having migraine headaches since she can remember. They are constant mostly. She states she sees Writer.  She has associated nausea at times.  Currently taking Topamax  25 mg daily.   SLUMS 09/25/2021 - 15/20  MRI Brain without contrast 04/15/2020: No acute intracranial abnormality or significant interval change. Remote left parietal and occipital encephalomalacia. Remote lacunar infarcts of the cerebellum bilaterally. Stable 10 mm meningioma to the right of the superior sagittal sinus. Previously described lesion in the left cavernous sinus is stable, potentially meningioma.   History of seizure-like activity worse after coming off Anti Seizure Medication  Routine Sleep-deprived EEG - mild left temporal theta slowing   Medications: Keppra , gabapentin    History of frequent falls   History of migraine with aura   Neuropathy in bilateral lower extremity  Medications: gabapentin    Disease Summary per Dr. Lane 12/05/2015:  HISTORY OF TIA  Patient states she has had two strokes in the last 6 months.  States she had one in April and then she had a mini strokes a few months after.  Patient states that in April she had right sided weakness.  States that lasted a few days.  Patient states that she has a little residual weakness.  States that she has completed Physical Therapy but it has been a while since she had that. Patient states that she had loss of balance and headache when she had mini stroke. States she went to [facility] for this.  Was at [facility] for stroke in April.  This was short lasting.  Patient has been on Aggrenox  for 2 years.  Patient states she was on Plavix  prior to that for small vessel disease in head.   Patient thinks she has had more recent US  carotid but most recent in chart is 2006.  States she has had surgery on her carotid arteries for a Blockage in 2008.  SPELLS Per patient she has a history of  seizures.  States she has had seizures for about 5 years.  Per patient no known reason for this.  States she also has uncontrollable tremors.  Per patient her last seizure was 2-3 weeks ago.  Patient can tell she is going to have seizure.  States she has dizziness and headache prior to seizure. Last seconds.  Doesn't have incontinence or tongue biting.  States she is aware of everything while this is happening.  Feels strange for a few minutes but then is able to resume normal activities.  Patient states she was on Keppra  but hasn't taken this in a while since she moved from [facility].  Patient states she hasnt had an EEG in a year or so.  States she thinks the Keppra  helped her because she had less spells.  She doesn't thinks she has epilepsy.  She thinks these are stress  spells.  Physical Exam   Vitals Vitals:   10/13/23 0851  BP: 110/60  Pulse: 95  SpO2: 97%  Weight: 77.6 kg (171 lb)  Height: 154.9 cm (5' 1)  PainSc: 0-No pain     Body mass index is 32.31 kg/m.  (Some of the exam changes noted are from previous clinical observations) Physical Exam CARDIOVASCULAR: Heart sounds normal, regular rhythm. NEUROLOGICAL: Gait with slight hunched posture and decreased right arm swing. Slight resting tremor in the lip.   GENERAL: Observable lip tremor. CHEST: Lungs clear to auscultation. CARDIOVASCULAR: Heart sounds S1, S2 normal. Regular rate and rhythm.  Wheezing bilaterally  09/26/2022- 15/30 Total Slums Score This exam is reliable for alert patients, it can evaluate multiple neurocognitive domains including orientation, attention, concentration, memory, language, fund of knowledge etc.  09/25/2021: Day - correct, Month - correct, Year - correct   Number of children - correct  Number of grandchildren - correct  Names of grandchildren - incorrect     12/04/2022 Bump on left posterior occipital region Day - said 20th, it is 21st, Month - correct, Year - correct   Past Medical  History:  Past Medical History:  Diagnosis Date  . COPD (chronic obstructive pulmonary disease) (CMS/HHS-HCC)   . Coronary artery disease   . Diabetes mellitus type 2, uncomplicated (CMS/HHS-HCC)   . GERD (gastroesophageal reflux disease)   . Heart disease   . Hyperlipidemia   . Hypertension   . Myocardial infarction (CMS/HHS-HCC)   . Stroke (CMS/HHS-HCC)     Past Surgical History:  Past Surgical History:  Procedure Laterality Date  .  Reverse right total shoulder arthroplasty. Right 08/25/2019   Dr.Poggi   . ARTHROSCOPIC ROTATOR CUFF REPAIR    . CAROTID ENDARTERECTOMY    . CHOLECYSTECTOMY    . CORONARY ANGIOPLASTY    . HYSTERECTOMY    . KNEE ARTHROSCOPY    . LAPAROSCOPIC CHOLECYSTECTOMY    . TONSILLECTOMY     Family History:  Family History  Problem Relation Name Age of Onset  . Heart disease Mother    . Heart disease Father    . High blood pressure (Hypertension) Father    . Heart disease Sister    . Heart disease Brother     Social History:  Social History   Socioeconomic History  . Marital status: Single  Tobacco Use  . Smoking status: Every Day    Current packs/day: 0.50    Average packs/day: 1 pack/day for 59.8 years (58.4 ttl pk-yrs)    Types: Cigarettes    Start date: 1966  . Smokeless tobacco: Never  Vaping Use  . Vaping status: Never Used  Substance and Sexual Activity  . Alcohol use: No  . Drug use: No  . Sexual activity: Defer   Social Drivers of Health   Financial Resource Strain: Low Risk  (08/18/2023)   Overall Financial Resource Strain (CARDIA)   . Difficulty of Paying Living Expenses: Not very hard  Food Insecurity: No Food Insecurity (08/18/2023)   Hunger Vital Sign   . Worried About Programme researcher, broadcasting/film/video in the Last Year: Never true   . Ran Out of Food in the Last Year: Never true  Transportation Needs: No Transportation Needs (08/18/2023)   PRAPARE - Transportation   . Lack of Transportation (Medical): No   . Lack of Transportation  (Non-Medical): No  Physical Activity: Insufficiently Active (05/19/2017)   Received [facility]   Exercise Vital Sign   . Days of Exercise per  Week: 2 days   . Minutes of Exercise per Session: 20 min  Stress: Stress Concern Present (05/19/2017)   Received from [facility] - Occupational Stress Questionnaire   . Feeling of Stress : Rather much  Social Connections: Moderately Isolated (05/19/2017)   Received from [facility]   Social Connection and Isolation Panel   . Frequency of Communication with Friends and Family: More than three times a week   . Frequency of Social Gatherings with Friends and Family: More than three times a week   . Attends Religious Services: Never   . Active Member of Clubs or Organizations: No   . Attends Banker Meetings: Never   . Marital Status: Divorced  Housing Stability: Low Risk  (08/18/2023)   Housing Stability Vital Sign   . Unable to Pay for Housing in the Last Year: No   . Number of Times Moved in the Last Year: 0   . Homeless in the Last Year: No   Allergies:  Allergies  Allergen Reactions  . Mushroom Anaphylaxis    Made patient deathly sick  . Atenolol Other (See Comments)    MD took me off of it bc it was doing something wrong Made patient HYPOTENSIVE MD took me off of it bc it was doing something wrong Made patient HYPOTENSIVE  . Oxycodone-Acetaminophen  Nausea And Vomiting and Other (See Comments)    Stomach pains Stomach pains  . Oxycodone-Aspirin  Nausea And Vomiting and Other (See Comments)    Stomach pains  . Tramadol  Nausea  . Verapamil Other (See Comments)     MD told me this was screwing me up MAKES PATIENT HYPOTENSIVE  MD told me this was screwing me up MAKES PATIENT HYPOTENSIVE  . Iodinated Contrast Media Itching    Pt injected with IV contrast.  5 min after injection pt complained of itching behind ear and on her abd.  . Morphine  Other (See Comments)     stops my breathing NEAR-RESPIRATORY ARREST   Medications: Current Outpatient Medications on File Prior to Visit  Medication Sig Dispense Refill  . albuterol  sulfate (VENTOLIN  HFA INHAL) Inhale 2 Puffs into the lungs every 6 (six) hours as needed (weezing)    . apixaban  (ELIQUIS ) 5 mg tablet Take 1 tablet (5 mg total) by mouth every 12 (twelve) hours 180 tablet 3  . atorvastatin  (LIPITOR ) 10 MG tablet Take 1 tablet (10 mg total) by mouth once daily for 180 days 90 tablet 1  . blood glucose diagnostic, drum test strip Use 2 (two) times daily Use as instructed. 200 each 2  . BREO ELLIPTA  100-25 mcg/dose DsDv inhaler Inhale 1 Puff into the lungs once daily    . carbidopa-levodopa (SINEMET) 25-100 mg tablet Take 0.5 tablets by mouth 3 (three) times daily for 3 days, THEN 1 tablet 3 (three) times daily for 30 days. (Patient taking differently: 1 tablet 3 (three) times daily for 30 days.) 95 tablet 0  . cholecalciferol  (VITAMIN D3) 2,000 unit tablet Take 2,000 Units by mouth once daily    . clopidogreL  (PLAVIX ) 75 mg tablet Take 75 mg by mouth once daily    . diclofenac  (VOLTAREN ) 1 % topical gel Apply 2 g topically 4 (four) times daily    . ferrous sulfate  325 (65 FE) MG tablet TAKE 1 TABLET (325 MG TOTAL) BY MOUTH ONCE DAILY 30 tablet 4  . folic acid  (FOLVITE ) 1 MG tablet Take 1 mg by mouth once daily    .  FUROsemide  (LASIX ) 20 MG tablet Take 1 tablet (20 mg total) by mouth once daily for 180 days 90 tablet 1  . glipiZIDE  (GLUCOTROL ) 5 MG tablet Take 5 mg by mouth 2 (two) times daily before meals    . levothyroxine  (SYNTHROID ) 100 MCG tablet Take 125 mcg by mouth once daily Take on an empty stomach with a glass of water at least 30-60 minutes before breakfast.    . lisinopriL  (ZESTRIL ) 10 MG tablet Take 10 mg by mouth once daily    . LORazepam  (ATIVAN ) 0.5 MG tablet Take 0.5 mg by mouth 2 (two) times daily as needed for Anxiety    . magnesium  oxide (MAG-OX) 400 mg (241.3 mg magnesium ) tablet Take  400 mg by mouth once daily    . melatonin 10 mg Cap Take by mouth    . metFORMIN  (GLUCOPHAGE ) 500 MG tablet Take 500 mg by mouth 2 (two) times daily with meals    . miscellaneous medical supply Misc WALL MOUNTED SHOWER RAIL. 1 each 0  . nystatin (MYCOSTATIN) 100,000 unit/gram powder Apply topically 2 (two) times daily    . ondansetron  (ZOFRAN ) 4 MG tablet Take 4 mg by mouth every 6 (six) hours as needed for Nausea    . pantoprazole  (PROTONIX ) 40 MG DR tablet Take 40 mg by mouth once daily    . polyethylene glycol (MIRALAX ) packet Take 17 g by mouth once daily Mix in 4-8ounces of fluid prior to taking.    . potassium chloride  (KLOR-CON  M20) 20 MEQ ER tablet Take 20 mEq by mouth once daily    . risperiDONE (RISPERDAL) 0.25 MG tablet Take 0.25 mg by mouth 2 (two) times daily    . SENNA 8.6 mg tablet Take 2 tablets by mouth once daily    . triamcinolone  0.1 % cream Apply topically 2 (two) times daily    . cyanocobalamin  (VITAMIN B12) 1000 MCG tablet Take 1 tablet (1,000 mcg total) by mouth once daily for 180 days 90 tablet 1  . DULoxetine  (CYMBALTA ) 60 MG DR capsule Take 1 capsule by mouth once daily (Patient not taking: Reported on 10/13/2023)     No current facility-administered medications on file prior to visit.   This note has been created using automated tools and reviewed for accuracy by ELIAS GREGORY RODRIGUEZ.  Attestation Statement:   I personally performed the service, non-incident to. (WP)   ELIAS CORDELLA STALLION, NP  Allyson STALLION, FNP-BC   This was an urgent visit, requiring modification of my schedule, so patient doesn't have to go to urgent care or ER.

## 2023-10-19 ENCOUNTER — Other Ambulatory Visit: Payer: Self-pay

## 2023-10-19 ENCOUNTER — Emergency Department

## 2023-10-19 ENCOUNTER — Encounter: Payer: Self-pay | Admitting: Internal Medicine

## 2023-10-19 ENCOUNTER — Inpatient Hospital Stay
Admission: EM | Admit: 2023-10-19 | Discharge: 2023-10-27 | DRG: 054 | Disposition: A | Discharge status: Skilled Nursing Facility | Source: Skilled Nursing Facility | Attending: Hospitalist | Admitting: Hospitalist

## 2023-10-19 DIAGNOSIS — E039 Hypothyroidism, unspecified: Secondary | ICD-10-CM | POA: Diagnosis present

## 2023-10-19 DIAGNOSIS — G9389 Other specified disorders of brain: Secondary | ICD-10-CM | POA: Diagnosis not present

## 2023-10-19 DIAGNOSIS — Z823 Family history of stroke: Secondary | ICD-10-CM

## 2023-10-19 DIAGNOSIS — F17210 Nicotine dependence, cigarettes, uncomplicated: Secondary | ICD-10-CM | POA: Diagnosis present

## 2023-10-19 DIAGNOSIS — R2990 Unspecified symptoms and signs involving the nervous system: Secondary | ICD-10-CM | POA: Diagnosis not present

## 2023-10-19 DIAGNOSIS — Z91041 Radiographic dye allergy status: Secondary | ICD-10-CM

## 2023-10-19 DIAGNOSIS — F01518 Vascular dementia, unspecified severity, with other behavioral disturbance: Secondary | ICD-10-CM | POA: Diagnosis present

## 2023-10-19 DIAGNOSIS — R4182 Altered mental status, unspecified: Principal | ICD-10-CM | POA: Diagnosis present

## 2023-10-19 DIAGNOSIS — Z888 Allergy status to other drugs, medicaments and biological substances status: Secondary | ICD-10-CM

## 2023-10-19 DIAGNOSIS — Z79899 Other long term (current) drug therapy: Secondary | ICD-10-CM

## 2023-10-19 DIAGNOSIS — R7881 Bacteremia: Secondary | ICD-10-CM | POA: Diagnosis present

## 2023-10-19 DIAGNOSIS — Z7902 Long term (current) use of antithrombotics/antiplatelets: Secondary | ICD-10-CM

## 2023-10-19 DIAGNOSIS — F0150 Vascular dementia without behavioral disturbance: Secondary | ICD-10-CM | POA: Diagnosis present

## 2023-10-19 DIAGNOSIS — Z96611 Presence of right artificial shoulder joint: Secondary | ICD-10-CM | POA: Diagnosis present

## 2023-10-19 DIAGNOSIS — J449 Chronic obstructive pulmonary disease, unspecified: Secondary | ICD-10-CM | POA: Diagnosis present

## 2023-10-19 DIAGNOSIS — R4781 Slurred speech: Secondary | ICD-10-CM

## 2023-10-19 DIAGNOSIS — G8929 Other chronic pain: Secondary | ICD-10-CM | POA: Diagnosis present

## 2023-10-19 DIAGNOSIS — Z1612 Extended spectrum beta lactamase (ESBL) resistance: Secondary | ICD-10-CM | POA: Diagnosis present

## 2023-10-19 DIAGNOSIS — C719 Malignant neoplasm of brain, unspecified: Principal | ICD-10-CM | POA: Diagnosis present

## 2023-10-19 DIAGNOSIS — I5032 Chronic diastolic (congestive) heart failure: Secondary | ICD-10-CM | POA: Diagnosis present

## 2023-10-19 DIAGNOSIS — G9340 Encephalopathy, unspecified: Secondary | ICD-10-CM | POA: Diagnosis present

## 2023-10-19 DIAGNOSIS — J4489 Other specified chronic obstructive pulmonary disease: Secondary | ICD-10-CM | POA: Diagnosis present

## 2023-10-19 DIAGNOSIS — Z885 Allergy status to narcotic agent status: Secondary | ICD-10-CM

## 2023-10-19 DIAGNOSIS — I110 Hypertensive heart disease with heart failure: Secondary | ICD-10-CM | POA: Diagnosis present

## 2023-10-19 DIAGNOSIS — Z9049 Acquired absence of other specified parts of digestive tract: Secondary | ICD-10-CM

## 2023-10-19 DIAGNOSIS — Z8261 Family history of arthritis: Secondary | ICD-10-CM

## 2023-10-19 DIAGNOSIS — E1169 Type 2 diabetes mellitus with other specified complication: Secondary | ICD-10-CM | POA: Diagnosis present

## 2023-10-19 DIAGNOSIS — D500 Iron deficiency anemia secondary to blood loss (chronic): Secondary | ICD-10-CM | POA: Diagnosis present

## 2023-10-19 DIAGNOSIS — I10 Essential (primary) hypertension: Secondary | ICD-10-CM | POA: Diagnosis present

## 2023-10-19 DIAGNOSIS — G629 Polyneuropathy, unspecified: Secondary | ICD-10-CM

## 2023-10-19 DIAGNOSIS — Z886 Allergy status to analgesic agent status: Secondary | ICD-10-CM

## 2023-10-19 DIAGNOSIS — Z825 Family history of asthma and other chronic lower respiratory diseases: Secondary | ICD-10-CM

## 2023-10-19 DIAGNOSIS — F32A Depression, unspecified: Secondary | ICD-10-CM | POA: Diagnosis present

## 2023-10-19 DIAGNOSIS — R569 Unspecified convulsions: Secondary | ICD-10-CM

## 2023-10-19 DIAGNOSIS — R0902 Hypoxemia: Secondary | ICD-10-CM | POA: Diagnosis present

## 2023-10-19 DIAGNOSIS — Z90710 Acquired absence of both cervix and uterus: Secondary | ICD-10-CM

## 2023-10-19 DIAGNOSIS — Z811 Family history of alcohol abuse and dependence: Secondary | ICD-10-CM

## 2023-10-19 DIAGNOSIS — F3341 Major depressive disorder, recurrent, in partial remission: Secondary | ICD-10-CM | POA: Diagnosis present

## 2023-10-19 DIAGNOSIS — K219 Gastro-esophageal reflux disease without esophagitis: Secondary | ICD-10-CM | POA: Diagnosis present

## 2023-10-19 DIAGNOSIS — Z818 Family history of other mental and behavioral disorders: Secondary | ICD-10-CM

## 2023-10-19 DIAGNOSIS — Z79890 Hormone replacement therapy (postmenopausal): Secondary | ICD-10-CM

## 2023-10-19 DIAGNOSIS — E785 Hyperlipidemia, unspecified: Secondary | ICD-10-CM | POA: Diagnosis present

## 2023-10-19 DIAGNOSIS — Z803 Family history of malignant neoplasm of breast: Secondary | ICD-10-CM

## 2023-10-19 DIAGNOSIS — B9620 Unspecified Escherichia coli [E. coli] as the cause of diseases classified elsewhere: Secondary | ICD-10-CM | POA: Diagnosis present

## 2023-10-19 DIAGNOSIS — G20C Parkinsonism, unspecified: Secondary | ICD-10-CM | POA: Diagnosis present

## 2023-10-19 DIAGNOSIS — Z7901 Long term (current) use of anticoagulants: Secondary | ICD-10-CM

## 2023-10-19 DIAGNOSIS — G4700 Insomnia, unspecified: Secondary | ICD-10-CM | POA: Diagnosis present

## 2023-10-19 DIAGNOSIS — Z85828 Personal history of other malignant neoplasm of skin: Secondary | ICD-10-CM

## 2023-10-19 DIAGNOSIS — R29810 Facial weakness: Secondary | ICD-10-CM | POA: Diagnosis present

## 2023-10-19 DIAGNOSIS — G9341 Metabolic encephalopathy: Secondary | ICD-10-CM | POA: Diagnosis present

## 2023-10-19 DIAGNOSIS — Z7984 Long term (current) use of oral hypoglycemic drugs: Secondary | ICD-10-CM

## 2023-10-19 DIAGNOSIS — Z8249 Family history of ischemic heart disease and other diseases of the circulatory system: Secondary | ICD-10-CM

## 2023-10-19 DIAGNOSIS — I480 Paroxysmal atrial fibrillation: Secondary | ICD-10-CM | POA: Diagnosis present

## 2023-10-19 DIAGNOSIS — Z813 Family history of other psychoactive substance abuse and dependence: Secondary | ICD-10-CM

## 2023-10-19 DIAGNOSIS — G40909 Epilepsy, unspecified, not intractable, without status epilepticus: Secondary | ICD-10-CM | POA: Diagnosis present

## 2023-10-19 DIAGNOSIS — E876 Hypokalemia: Secondary | ICD-10-CM | POA: Diagnosis present

## 2023-10-19 DIAGNOSIS — Z833 Family history of diabetes mellitus: Secondary | ICD-10-CM

## 2023-10-19 DIAGNOSIS — A498 Other bacterial infections of unspecified site: Secondary | ICD-10-CM

## 2023-10-19 DIAGNOSIS — D330 Benign neoplasm of brain, supratentorial: Secondary | ICD-10-CM

## 2023-10-19 DIAGNOSIS — R29898 Other symptoms and signs involving the musculoskeletal system: Secondary | ICD-10-CM

## 2023-10-19 DIAGNOSIS — Z91018 Allergy to other foods: Secondary | ICD-10-CM

## 2023-10-19 DIAGNOSIS — Z8673 Personal history of transient ischemic attack (TIA), and cerebral infarction without residual deficits: Secondary | ICD-10-CM

## 2023-10-19 DIAGNOSIS — F02818 Dementia in other diseases classified elsewhere, unspecified severity, with other behavioral disturbance: Secondary | ICD-10-CM | POA: Diagnosis present

## 2023-10-19 DIAGNOSIS — I2510 Atherosclerotic heart disease of native coronary artery without angina pectoris: Secondary | ICD-10-CM | POA: Diagnosis present

## 2023-10-19 DIAGNOSIS — G20A1 Parkinson's disease without dyskinesia, without mention of fluctuations: Secondary | ICD-10-CM | POA: Diagnosis present

## 2023-10-19 DIAGNOSIS — Z955 Presence of coronary angioplasty implant and graft: Secondary | ICD-10-CM

## 2023-10-19 DIAGNOSIS — Z66 Do not resuscitate: Secondary | ICD-10-CM | POA: Diagnosis present

## 2023-10-19 DIAGNOSIS — Z1152 Encounter for screening for COVID-19: Secondary | ICD-10-CM

## 2023-10-19 LAB — COMPREHENSIVE METABOLIC PANEL WITH GFR
ALT: <5 U/L (ref 0–44)
AST: 21 U/L (ref 15–41)
Albumin: 3.8 g/dL (ref 3.5–5.0)
Alkaline Phosphatase: 80 U/L (ref 38–126)
Anion gap: 10 (ref 5–15)
BUN: 13 mg/dL (ref 8–23)
CO2: 23 mmol/L (ref 22–32)
Calcium: 8.4 mg/dL — ABNORMAL LOW (ref 8.9–10.3)
Chloride: 105 mmol/L (ref 98–111)
Creatinine, Ser: 0.56 mg/dL (ref 0.44–1.00)
GFR, Estimated: >60 mL/min (ref >60)
Glucose, Bld: 172 mg/dL — ABNORMAL HIGH (ref 70–99)
Potassium: 3.7 mmol/L (ref 3.5–5.1)
Sodium: 138 mmol/L (ref 135–145)
Total Bilirubin: 0.4 mg/dL (ref 0.0–1.2)
Total Protein: 6.8 g/dL (ref 6.5–8.1)

## 2023-10-19 LAB — RESP PANEL BY RT-PCR (RSV, FLU A&B, COVID)  RVPGX2
Influenza A by PCR: NEGATIVE
Influenza B by PCR: NEGATIVE
Resp Syncytial Virus by PCR: NEGATIVE
SARS Coronavirus 2 by RT PCR: NEGATIVE

## 2023-10-19 LAB — CBC WITH DIFFERENTIAL/PLATELET
Abs Immature Granulocytes: 0.02 K/uL (ref 0.00–0.07)
Basophils Absolute: 0 K/uL (ref 0.0–0.1)
Basophils Relative: 0 %
Eosinophils Absolute: 0 K/uL (ref 0.0–0.5)
Eosinophils Relative: 0 %
HCT: 34.6 % — ABNORMAL LOW (ref 36.0–46.0)
Hemoglobin: 11 g/dL — ABNORMAL LOW (ref 12.0–15.0)
Immature Granulocytes: 0 %
Lymphocytes Relative: 5 %
Lymphs Abs: 0.4 K/uL — ABNORMAL LOW (ref 0.7–4.0)
MCH: 28.6 pg (ref 26.0–34.0)
MCHC: 31.8 g/dL (ref 30.0–36.0)
MCV: 90.1 fL (ref 80.0–100.0)
Monocytes Absolute: 0.6 K/uL (ref 0.1–1.0)
Monocytes Relative: 7 %
Neutro Abs: 7.2 K/uL (ref 1.7–7.7)
Neutrophils Relative %: 88 %
Platelets: 236 K/uL (ref 150–400)
RBC: 3.84 MIL/uL — ABNORMAL LOW (ref 3.87–5.11)
RDW: 13.9 % (ref 11.5–15.5)
WBC: 8.2 K/uL (ref 4.0–10.5)
nRBC: 0 % (ref 0.0–0.2)

## 2023-10-19 LAB — URINALYSIS, ROUTINE W REFLEX MICROSCOPIC
Bilirubin Urine: NEGATIVE
Glucose, UA: NEGATIVE mg/dL
Hgb urine dipstick: NEGATIVE
Ketones, ur: NEGATIVE mg/dL
Leukocytes,Ua: NEGATIVE
Nitrite: NEGATIVE
Protein, ur: NEGATIVE mg/dL
Specific Gravity, Urine: 1.015 (ref 1.005–1.030)
pH: 5 (ref 5.0–8.0)

## 2023-10-19 LAB — CBG MONITORING, ED: Glucose-Capillary: 158 mg/dL — ABNORMAL HIGH (ref 70–99)

## 2023-10-19 LAB — PROTIME-INR
INR: 1.3 — ABNORMAL HIGH (ref 0.8–1.2)
Prothrombin Time: 17 s — ABNORMAL HIGH (ref 11.4–15.2)

## 2023-10-19 LAB — TROPONIN I (HIGH SENSITIVITY)
Troponin I (High Sensitivity): 5 ng/L (ref <18)
Troponin I (High Sensitivity): 9 ng/L (ref <18)

## 2023-10-19 LAB — ETHANOL: Alcohol, Ethyl (B): <15 mg/dL (ref <15)

## 2023-10-19 LAB — GLUCOSE, CAPILLARY
Glucose-Capillary: 183 mg/dL — ABNORMAL HIGH (ref 70–99)
Glucose-Capillary: 178 mg/dL — ABNORMAL HIGH (ref 70–99)

## 2023-10-19 LAB — APTT: aPTT: 32 s (ref 24–36)

## 2023-10-19 MED ORDER — POLYETHYLENE GLYCOL 3350 17 G PO PACK: 17.0000 g | PACK | Freq: Every day | ORAL | Status: DC | PRN

## 2023-10-19 MED ORDER — APIXABAN 5 MG PO TABS
5.0000 mg | ORAL_TABLET | Freq: Two times a day (BID) | ORAL | Status: DC
  Administered 2023-10-19 – 2023-10-26 (×12): 5 mg via ORAL
  Filled 2023-10-19 (×12): qty 1

## 2023-10-19 MED ORDER — FLUTICASONE FUROATE-VILANTEROL 100-25 MCG/ACT IN AEPB
1.0000 | INHALATION_SPRAY | Freq: Every day | RESPIRATORY_TRACT | Status: DC
  Administered 2023-10-22 – 2023-10-26 (×5): 1 via RESPIRATORY_TRACT
  Filled 2023-10-19: qty 28

## 2023-10-19 MED ORDER — ACETAMINOPHEN 325 MG PO TABS: 650.0000 mg | ORAL_TABLET | Freq: Four times a day (QID) | ORAL | Status: DC | PRN

## 2023-10-19 MED ORDER — LORAZEPAM 2 MG/ML IJ SOLN
0.5000 mg | Freq: Four times a day (QID) | INTRAMUSCULAR | Status: DC | PRN
  Administered 2023-10-21 – 2023-10-23 (×2): 0.5 mg via INTRAVENOUS
  Filled 2023-10-19 (×2): qty 1

## 2023-10-19 MED ORDER — ENOXAPARIN SODIUM 40 MG/0.4ML IJ SOSY: 40.0000 mg | PREFILLED_SYRINGE | INTRAMUSCULAR | Status: DC

## 2023-10-19 MED ORDER — CARBIDOPA-LEVODOPA 25-100 MG PO TABS
1.0000 | ORAL_TABLET | Freq: Three times a day (TID) | ORAL | Status: DC
  Administered 2023-10-19 – 2023-10-26 (×17): 1 via ORAL
  Filled 2023-10-19 (×19): qty 1

## 2023-10-19 MED ORDER — INSULIN ASPART 100 UNIT/ML IJ SOLN
0.0000 [IU] | Freq: Three times a day (TID) | INTRAMUSCULAR | Status: DC
  Administered 2023-10-20 (×3): 2 [IU] via SUBCUTANEOUS
  Administered 2023-10-21 (×2): 1 [IU] via SUBCUTANEOUS
  Filled 2023-10-19 (×6): qty 1

## 2023-10-19 MED ORDER — PANTOPRAZOLE SODIUM 40 MG PO TBEC
40.0000 mg | DELAYED_RELEASE_TABLET | Freq: Every day | ORAL | Status: DC
  Administered 2023-10-20 – 2023-10-26 (×6): 40 mg via ORAL
  Filled 2023-10-19 (×6): qty 1

## 2023-10-19 MED ORDER — MIRTAZAPINE 15 MG PO TBDP
15.0000 mg | ORAL_TABLET | Freq: Every day | ORAL | Status: DC
  Administered 2023-10-19 – 2023-10-26 (×6): 15 mg via ORAL
  Filled 2023-10-19 (×8): qty 1

## 2023-10-19 MED ORDER — DULOXETINE HCL 30 MG PO CPEP
60.0000 mg | ORAL_CAPSULE | Freq: Every day | ORAL | Status: DC
  Administered 2023-10-20 – 2023-10-26 (×6): 60 mg via ORAL
  Filled 2023-10-19 (×6): qty 2

## 2023-10-19 MED ORDER — LACTATED RINGERS IV SOLN
INTRAVENOUS | Status: DC
Start: 2023-10-19 — End: 2023-10-20

## 2023-10-19 MED ORDER — RISPERIDONE 0.5 MG PO TABS
0.5000 mg | ORAL_TABLET | Freq: Two times a day (BID) | ORAL | Status: DC
  Administered 2023-10-19 – 2023-10-26 (×12): 0.5 mg via ORAL
  Filled 2023-10-19 (×13): qty 1

## 2023-10-19 MED ORDER — ATORVASTATIN CALCIUM 20 MG PO TABS
10.0000 mg | ORAL_TABLET | Freq: Every day | ORAL | Status: DC
Start: 2023-10-20 — End: 2023-10-27
  Administered 2023-10-20 – 2023-10-26 (×6): 10 mg via ORAL
  Filled 2023-10-19 (×6): qty 1

## 2023-10-19 MED ORDER — LEVOTHYROXINE SODIUM 25 MCG PO TABS
125.0000 ug | ORAL_TABLET | Freq: Every day | ORAL | Status: DC
  Administered 2023-10-20 – 2023-10-26 (×4): 125 ug via ORAL
  Filled 2023-10-19 (×5): qty 1

## 2023-10-19 MED ORDER — LEVETIRACETAM (KEPPRA) 500 MG/5 ML ADULT IV PUSH
500.0000 mg | Freq: Two times a day (BID) | INTRAVENOUS | Status: DC
Start: 2023-10-19 — End: 2023-10-26
  Administered 2023-10-19 – 2023-10-26 (×12): 500 mg via INTRAVENOUS
  Filled 2023-10-19 (×15): qty 5

## 2023-10-19 MED ORDER — ACETAMINOPHEN 650 MG RE SUPP
650.0000 mg | Freq: Four times a day (QID) | RECTAL | Status: DC | PRN
  Filled 2023-10-19: qty 1

## 2023-10-19 MED ORDER — MELATONIN 5 MG PO TABS
10.0000 mg | ORAL_TABLET | Freq: Every day | ORAL | Status: DC
  Administered 2023-10-19 – 2023-10-26 (×6): 10 mg via ORAL
  Filled 2023-10-19 (×6): qty 2

## 2023-10-19 MED ORDER — CLOPIDOGREL BISULFATE 75 MG PO TABS
75.0000 mg | ORAL_TABLET | Freq: Every day | ORAL | Status: DC
  Administered 2023-10-20 – 2023-10-26 (×6): 75 mg via ORAL
  Filled 2023-10-19 (×6): qty 1

## 2023-10-19 MED ORDER — SORBITOL 70 % SOLN: 30.0000 mL | Freq: Every day | Status: DC | PRN

## 2023-10-19 MED ORDER — ALBUTEROL SULFATE (2.5 MG/3ML) 0.083% IN NEBU: 2.5000 mg | INHALATION_SOLUTION | Freq: Four times a day (QID) | RESPIRATORY_TRACT | Status: DC | PRN

## 2023-10-19 NOTE — Plan of Care (Signed)

## 2023-10-19 NOTE — ED Provider Notes (Signed)
 MRI negative for stroke however concerning for neoplasm.  I discussed this with family.  They would not be interested in any surgical intervention at this time.  Also discussed with neurosurgery who will evaluate imaging.  Will plan on admission to the hospitalist service.   Floy Roberts, MD 10/19/23 1710

## 2023-10-19 NOTE — ED Triage Notes (Signed)
 From [facility], intailly called for Altered mental status. EMS noted soft BP gave 500 ml bag NS Pressure 123/55. Extensive Hx Parkinson, dementia seizures. C/o generalized pain upon arrival.

## 2023-10-19 NOTE — H&P (Addendum)
 History and Physical    Patient: Samantha Aguirre FMW:[id number] DOB: [birth year] DOA: 10/19/2023 DOS: the patient was seen and examined on 10/19/2023 PCP: Brutus Delon SAUNDERS, NP  Patient coming from: [facility]  Chief Complaint:  Chief Complaint  Patient presents with   Altered Mental Status   HPI: Samantha Aguirre is a 78 y.o. female with medical history significant of parkinsonism, vascular dementia with behavioral disturbances, prior strokes, COPD who presented to the ED from her assisted living facility today for evaluation of altered mental status, slurred speech and right leg weakness.  Patient is minimally capable to participate in history, no family at bedside therefore most history obtained from chart review.  Facility staff reported check in on patient early this morning around 640 at which time she was at her baseline.  Later in the morning they found patient on the floor and found to have right leg weakness, facial droop and slurred speech.  No reports of other recent illnesses or symptoms.  Patient follows with Dr. Loreli of [facility] neurology, last seen for follow-up on 10/13/2023.  Office visit note reviewed.  Patient was being seen for worsening behavioral disturbances that continued despite treatment for a UTI.  Risperdal dose was increased.  ED course: Initial vitals temp 98.2 F, HR 80, RR 18 but later in the 20s intermittently, BP 123/63, initial SpO2 100% on room air but patient later noted to desat to 88% (there is a charted 72% O2 sat as well).  Labs obtained included CMP and CBC which were notable for nonfasting glucose 172, hemoglobin stable 11.0, no leukocytosis, troponin normal at 5.  Viral PCR is negative for COVID, flu and RSV.  UA was negative for signs of infection.  Chest x-ray without acute cardiopulmonary findings.  CT cervical spine no acute injuries.    Neuroimaging with CT head without contrast and MRI brain were obtained showed a new masslike enlargement  of the callosal splenium extending into the medial parietal and occipital lobes with differential including primary CNS neoplasm such as glioblastoma or lymphoma.  Also showed an unchanged 11 mm meningioma and chronic left MCA territory stroke, as well as chronic infarcts in the left thalamus and bilateral cerebellar hemispheres.  ED provider discussed the case with neurosurgery who indicated that surgical resection would be contraindicated in the setting of patient's advanced age and underlying comorbidities including dementia.  Patient is admitted for further evaluation and management of hypoxia and for establishment of  palliative/hospice services.   Review of Systems: unable to review all systems due to the inability of the patient to answer questions.   Past Medical History:  Diagnosis Date   Acid reflux    Arthritis    Bilateral knees, hands, feet   Asthma    Atrial fibrillation (HCC)    Cancer (HCC)    CHF (congestive heart failure) (HCC)    Diastolic CHF   COPD (chronic obstructive pulmonary disease) (HCC)    Coronary artery disease    Diabetes mellitus without complication (HCC)    Frequent headaches    GI bleed    HLD (hyperlipidemia)    Hypertension    Seizures (HCC)    Skin cancer 2015   Suspected basal cell carcinoma on nose, surgically removed   Stroke (HCC) 04/2017   Thyroid  disease    Tremors of nervous system    Past Surgical History:  Procedure Laterality Date   ABDOMINAL HYSTERECTOMY  1976   APPENDECTOMY  1980   BACK SURGERY  cardiac stents     CAROTID ENDARTERECTOMY     CHOLECYSTECTOMY  1980   COLONOSCOPY N/A 02/20/2017   Procedure: COLONOSCOPY;  Surgeon: Unk Corinn Skiff, MD;  Location: [facility] ENDOSCOPY;  Service: Gastroenterology;  Laterality: N/A;   ESOPHAGOGASTRODUODENOSCOPY N/A 02/20/2017   Procedure: ESOPHAGOGASTRODUODENOSCOPY (EGD);  Surgeon: Unk Corinn Skiff, MD;  Location[facility] ENDOSCOPY;  Service: Gastroenterology;  Laterality: N/A;    ESOPHAGOGASTRODUODENOSCOPY (EGD) WITH PROPOFOL  N/A 07/08/2017   Procedure: ESOPHAGOGASTRODUODENOSCOPY (EGD) WITH PROPOFOL ;  Surgeon: Jinny Carmine, MD;  Location: [facility] ENDOSCOPY;  Service: Endoscopy;  Laterality: N/A;   GALLBLADDER SURGERY  1980   GIVENS CAPSULE STUDY N/A 07/09/2017   Procedure: GIVENS CAPSULE STUDY;  Surgeon: Therisa Bi, MD;  Location: [facility] ENDOSCOPY;  Service: Gastroenterology;  Laterality: N/A;   KNEE SURGERY     left   KYPHOPLASTY N/A 03/16/2018   Procedure: KYPHOPLASTY, L5;  Surgeon: Kathlynn Sharper, MD;  Location: [facility] ORS;  Service: Orthopedics;  Laterality: N/A;   REVERSE SHOULDER ARTHROPLASTY Right 08/25/2019   Procedure: REVERSE SHOULDER ARTHROPLASTY;  Surgeon: Edie Norleen PARAS, MD;  Location: [facility] ORS;  Service: Orthopedics;  Laterality: Right;   ROTATOR CUFF REPAIR     right   TONSILLECTOMY  1950   Social History:  reports that she has been smoking cigarettes. She has a 53 pack-year smoking history. She has never used smokeless tobacco. She reports that she does not drink alcohol and does not use drugs.  Allergies  Allergen Reactions   Ivp Dye [Iodinated Contrast Media] Itching    Pt injected with IV contrast.  5 min after injection pt complained of itching behind ear and on her abd.   Methotrexate Rash   Morphine  And Codeine Other (See Comments)    stops my breathing NEAR-RESPIRATORY ARREST   Mushroom Extract Complex (Obsolete) Anaphylaxis    Made patient deathly sick   Atenolol Other (See Comments)    MD took me off of it bc it was doing something wrong Made patient HYPOTENSIVE   Percocet [Oxycodone-Acetaminophen ] Nausea And Vomiting and Other (See Comments)    Stomach pains   Percodan [Oxycodone-Aspirin ] Nausea And Vomiting and Other (See Comments)    Stomach pains   Tramadol  Nausea Only   Verapamil Other (See Comments)     MD told me this was screwing me up MAKES PATIENT HYPOTENSIVE   Aspirin     Codeine    Oxycodone Hcl     Family History   Problem Relation Age of Onset   Breast cancer Mother    Heart disease Mother    Stroke Mother    Cancer Mother    COPD Mother    Diabetes Mother    Heart disease Father    Diabetes Father    Stroke Father    Alcohol abuse Sister    Drug abuse Sister    Stroke Sister    Cancer Sister    Mental illness Sister    Heart disease Brother    Arthritis Brother    Diabetes Brother    Heart disease Maternal Grandfather    Heart disease Paternal Grandfather     Prior to Admission medications   Medication Sig Start Date End Date Taking? Authorizing Provider  acetaminophen  (TYLENOL ) 500 MG tablet Take 1 tablet (500 mg total) by mouth every 6 (six) hours as needed. 05/04/23  Yes Janit Kast, PA-C  albuterol  (PROVENTIL  HFA;VENTOLIN  HFA) 108 (90 Base) MCG/ACT inhaler Inhale 2 puffs into the lungs every 6 (six) hours as needed for wheezing or shortness  of breath. 10/08/16  Yes Karamalegos, Marsa PARAS, DO  atorvastatin  (LIPITOR ) 10 MG tablet Take 10 mg by mouth daily.   Yes [provider]  BREO ELLIPTA  100-25 MCG/ACT AEPB Inhale 1 puff into the lungs daily.   Yes [provider]  Calcium  Carbonate 500 MG CHEW Chew 2 tablets by mouth every 4 (four) hours as needed (if no improvement in chest pain with tums).   Yes [provider]  carbidopa-levodopa (SINEMET IR) 25-100 MG tablet Take 1 tablet by mouth 3 (three) times daily. 06/03/23 10/12/24 Yes [provider]  Cholecalciferol  50 MCG (2000 UT) TABS Take 2,000 Units by mouth daily.   Yes [provider]  clopidogrel  (PLAVIX ) 75 MG tablet Take 1 tablet (75 mg total) by mouth daily. 05/11/22  Yes Jens Durand, MD  Corn Starch (ZEASORB, CORN STARCH,) POWD Apply topically daily. Apply to intertrigo daily   Yes [provider]  diclofenac  Sodium (VOLTAREN ) 1 % GEL Apply 2 g topically 3 (three) times daily.   Yes [provider]  DULoxetine  (CYMBALTA ) 60 MG capsule Take 1 capsule (60 mg  total) by mouth daily. 05/11/22  Yes Jens Durand, MD  ELIQUIS  5 MG TABS tablet Take 5 mg by mouth 2 (two) times daily.   Yes [provider]  ferrous sulfate  325 (65 FE) MG EC tablet Take 1 tablet (325 mg total) by mouth daily. 05/11/22  Yes Jens Durand, MD  folic acid  (FOLVITE ) 1 MG tablet Take 1 tablet (1 mg total) by mouth daily. 09/13/19  Yes Akula, Vijaya, MD  furosemide  (LASIX ) 20 MG tablet Take 20 mg by mouth daily.  10/07/18  Yes [provider]  gabapentin  (NEURONTIN ) 400 MG capsule Take 1 capsule (400 mg total) by mouth 3 (three) times daily. 10/22/22 10/19/23 Yes Lenon Marien CROME, MD  glipiZIDE  (GLUCOTROL ) 5 MG tablet Take 5 mg by mouth 2 (two) times daily.   Yes [provider]  hydrocortisone cream 1 % Apply 1 Application topically 2 (two) times daily as needed for itching.   Yes [provider]  levETIRAcetam  (KEPPRA ) 500 MG tablet Take 500 mg by mouth 2 (two) times daily. 09/11/23  Yes [provider]  levothyroxine  (SYNTHROID ) 125 MCG tablet Take 125 mcg by mouth daily. 10/13/23  Yes [provider]  lisinopril  (ZESTRIL ) 10 MG tablet Take 10 mg by mouth daily. 04/18/22  Yes [provider]  LORazepam  (ATIVAN ) 0.5 MG tablet Take 0.5 mg by mouth 2 (two) times daily. 8am and 4pm 10/06/23  Yes [provider]  MAGNESIUM  PO Take 500 mg by mouth daily.   Yes [provider]  Melatonin 10 MG TABS Take 10 mg by mouth at bedtime.   Yes [provider]  metFORMIN  (GLUCOPHAGE ) 500 MG tablet Take 500 mg by mouth 2 (two) times daily.   Yes [provider]  mirtazapine  (REMERON  SOL-TAB) 15 MG disintegrating tablet Take 1 tablet (15 mg total) by mouth at bedtime. 09/28/23 10/28/23 Yes Claudene Sham B, NP  nystatin (MYCOSTATIN/NYSTOP) powder Apply 1 Application topically 2 (two) times daily as needed (irritation).   Yes [provider]  ondansetron  (ZOFRAN ) 4 MG tablet Take 4 mg by mouth every 6  (six) hours as needed for nausea. 04/17/22  Yes [provider]  pantoprazole  (PROTONIX ) 40 MG tablet Take 40 mg by mouth daily.   Yes [provider]  polyethylene glycol (MIRALAX  / GLYCOLAX ) 17 g packet Take 17 g by mouth daily as needed for  mild constipation.   Yes [provider]  potassium chloride  SA (KLOR-CON  M) 20 MEQ tablet Take 20 mEq by mouth daily. 10/13/23  Yes [provider]  risperiDONE (RISPERDAL) 0.5 MG tablet Take 0.5 mg by mouth 2 (two) times daily. 10/13/23 10/12/24 Yes [provider]  senna (SENOKOT) 8.6 MG TABS tablet Take 2 tablets by mouth daily.   Yes [provider]  triamcinolone  cream (KENALOG ) 0.1 % Apply topically 2 (two) times daily as needed.   Yes [provider]  glipiZIDE -metformin  (METAGLIP ) 5-500 MG tablet Take 1 tablet by mouth daily. Patient not taking: Reported on 10/19/2023 05/11/22   Jens Durand, MD  omeprazole  (PRILOSEC) 40 MG capsule Take 20 mg by mouth daily. Patient not taking: Reported on 10/19/2023 09/02/17   [provider]    Physical Exam: Vitals:   10/19/23 1400 10/19/23 1430 10/19/23 1630 10/19/23 1700  BP: (!) 141/87 138/69  (!) 158/120  Pulse: 72 (!) 58 85   Resp: (!) 21 18 (!) 24 18  Temp:      TempSrc:      SpO2: (!) 88% (!) 72% 97%    General exam: awake, alert, no acute distress HEENT: Eyelids partially closed, moist mucus membranes, hearing grossly normal  Respiratory system: CTAB diminished bases, no wheezes, rales or rhonchi, normal respiratory effort.  On 2 L [place] O2 Cardiovascular system: normal S1/S2, RRR, no JVD, murmurs, rubs, gallops, no pedal edema.   Gastrointestinal system: soft, NT, ND, no HSM felt, +bowel sounds. Central nervous system: A&O x self.  Slurred speech, follows commands appropriately, symmetric intact 5/5 grip strength bilaterally, right lower extremity weakness patient unable to keep leg lifted against gravity, left lower extremity motor  seems intact 5/5 Skin: dry, intact, normal temperature, no rashes seen on visualized skin Psychiatry: normal mood, congruent affect, abnormal judgment and insight due to dementia   Data Reviewed:  As reviewed in detail above  Assessment and Plan:  Altered mental status Strokelike symptoms -right lower extremity weakness, slurred speech -stroke ruled out New brain mass -concerning for primary CNS neoplasm.  Neurosurgery is unable to offer resection given patient's advanced age and underlying dementia. -- TOC consulted for hospice referral -- Supportive care -- N.p.o. pending SLP for swallow evaluation -- Failed bedside swallow -- Gentle maintenance IV fluids for now - family agreeable -- PT OT evaluations -- Fall precautions --Neurosurgery consulted in ED --Neurology consult at family request for questions on expectations for clinical course  Acute hypoxia -without apparent respiratory symptoms.  O2 sat noted to drop as low as 72-88% on room air in the ED. chest x-ray without acute findings.  Viral PCR's negative Covid/flu/RSV.  Given concern for new malignancy, PE should be ruled out.  Patient has severe allergy to IV contrast. -- Will obtain VQ scan -follow results -- Supplement O2 as needed to keep sats above 90% -- As needed albuterol  nebs  History of CVA  - Continue Eliquis  and Plavix   Parkinsonism  -- Continue Sinemet  History of vascular dementia with behavioral disturbances  -- Continue home Risperdal 0.5 mg twice daily -- Delirium precautions  History of seizure-like activity -substitute IV for home p.o. Keppra  500 mg twice daily  COPD -stable without acute exacerbation, no wheezing on exam -- Continue Breo -- Nebulized albuterol  as needed  Type 2 diabetes --hold home glipizide  metformin  -- Sliding scale NovoLog   Hyperlipidemia -continue Lipitor  10 mg daily  Hypothyroidism -continue Synthroid   Insomnia -continue home melatonin and Remeron   GERD -  continue  Protonix     Advance Care Planning: CODE STATUS DNR Patient has a yellow DNR form hanging from the monitor in the ED.  Daughter Samantha Aguirre also confirm patient has durable DNR.  [facility] is interested in hospice at patient's assisted living facility and has reached out to them to see if hospice could follow pt at [facility].  Consults: None  Family Communication: None present at bedside.  Called daughter, Samantha Aguirre, updated & questions answered.  Severity of Illness: The appropriate patient status for this patient is OBSERVATION. Observation status is judged to be reasonable and necessary in order to provide the required intensity of service to ensure the patient's safety. The patient's presenting symptoms, physical exam findings, and initial radiographic and laboratory data in the context of their medical condition is felt to place them at decreased risk for further clinical deterioration. Furthermore, it is anticipated that the patient will be medically stable for discharge from the hospital within 2 midnights of admission.   Author: Burnard DELENA Cunning, DO 10/19/2023 5:09 PM  For on call review [email or web address].

## 2023-10-19 NOTE — ED Provider Notes (Signed)
 Samantha Aguirre Provider Note    Event Date/Time   First MD Initiated Contact with Patient 10/19/23 1257     (approximate)   History   Altered Mental Status   HPI  Samantha Aguirre is a 78 y.o. female with Parkinson's, dementia, prior history of strokes, COPD, presenting with altered mental status and right-sided weakness and slurred speech.  Patient denies any chest pain or shortness of breath, she denies any pain at this time.  No headache.  Per independent history from EMS, facility had called for altered mental status, blood pressures were found to be soft in the high 90s systolics, they gave her 500 cc of fluids, repeat blood pressures were in the 120s.  Patient had been complaining about generalized pain.  No reported seizures.  On independent review, she was seen by neurology at the end of September, has history of behavioral disturbances with history of vascular dementia and Parkinson's, is on risperidone.  Has history of seizure-like activity, is on Keppra  and gabapentin .     Physical Exam   Triage Vital Signs: ED Triage Vitals [10/19/23 1259]  Encounter Vitals Group     BP 123/63     Girls Systolic BP Percentile      Girls Diastolic BP Percentile      Boys Systolic BP Percentile      Boys Diastolic BP Percentile      Pulse Rate 80     Resp 18     Temp      Temp src      SpO2 100 %     Weight      Height      Head Circumference      Peak Flow      Pain Score      Pain Loc      Pain Education      Exclude from Growth Chart     Most recent vital signs: Vitals:   10/19/23 1325 10/19/23 1400  BP:  (!) 141/87  Pulse:  72  Resp:  (!) 21  Temp: 98.2 F (36.8 C)   SpO2:  (!) 88%     General: Awake, no distress.  CV:  Good peripheral perfusion.  Resp:  Normal effort.  No tachypnea or respiratory distress, no thoracic cage tenderness Abd:  No distention.  Soft nontender Other:  No obvious facial droop, she has mild slurred speech,  pupils are equal, extraocular movements are intact, no obvious upper extremity weakness, she does have weakness to her right lower extremity, no sensory deficits.  No palpable skull deformities or tenderness, no tenderness to her extremities or to her midline spine.   ED Results / Procedures / Treatments   Labs (all labs ordered are listed, but only abnormal results are displayed) Labs Reviewed  CBC WITH DIFFERENTIAL/PLATELET - Abnormal; Notable for the following components:      Result Value   RBC 3.84 (*)    Hemoglobin 11.0 (*)    HCT 34.6 (*)    Lymphs Abs 0.4 (*)    All other components within normal limits  COMPREHENSIVE METABOLIC PANEL WITH GFR - Abnormal; Notable for the following components:   Glucose, Bld 172 (*)    Calcium  8.4 (*)    All other components within normal limits  URINALYSIS, ROUTINE W REFLEX MICROSCOPIC - Abnormal; Notable for the following components:   Color, Urine YELLOW (*)    APPearance CLEAR (*)    All other components within normal limits  PROTIME-INR - Abnormal; Notable for the following components:   Prothrombin Time 17.0 (*)    INR 1.3 (*)    All other components within normal limits  CBG MONITORING, ED - Abnormal; Notable for the following components:   Glucose-Capillary 158 (*)    All other components within normal limits  RESP PANEL BY RT-PCR (RSV, FLU A&B, COVID)  [id number]  ETHANOL  APTT  URINE DRUG SCREEN, QUALITATIVE ([facility] ONLY)  TROPONIN I (HIGH SENSITIVITY)  TROPONIN I (HIGH SENSITIVITY)     EKG  EKG shows, sinus node, rate 80, normal QS, normal QTc, no obvious ischemic ST elevation, baseline is wandering due to patient movement, not significantly changed compared to prior   RADIOLOGY On my independent interpretation, CT head without obvious intracranial hemorrhage   PROCEDURES:  Critical Care performed: Yes, see critical care procedure note(s)  .Critical Care  Performed by: Waymond Lorelle Cummins, MD Authorized by: Waymond Lorelle Cummins, MD    Critical care provider statement:    Critical care time (minutes):  40   Critical care was necessary to treat or prevent imminent or life-threatening deterioration of the following conditions:  Respiratory failure   Critical care was time spent personally by me on the following activities:  Development of treatment plan with patient or surrogate, discussions with consultants, evaluation of patient's response to treatment, examination of patient, ordering and review of laboratory studies, ordering and review of radiographic studies, ordering and performing treatments and interventions, pulse oximetry, re-evaluation of patient's condition and review of old charts    MEDICATIONS ORDERED IN ED: Medications - No data to display   IMPRESSION / MDM / ASSESSMENT AND PLAN / ED COURSE  I reviewed the triage vital signs and the nursing notes.                              Differential diagnosis includes, but is not limited to, CVA, TIA, recrudescence, UTI, atypical ACS, intracranial hemorrhage, fracture, contusion, strain, sprain.  Will get labs, EKG, troponin, chest x-ray, CT head, cervical spine, MRI.  UA.  Discussed with daughter and facility, patient's last known well was at 640 in the morning, she is outside tPA window, stroke alert was not activated.  Patient's presentation is most consistent with acute presentation with potential threat to life or bodily function.  Independent interpretation of labs and imaging below.  Patient signed out pending imaging results.  Will need to be admitted for hypoxia and new neuro findings.  The patient is on the cardiac monitor to evaluate for evidence of arrhythmia and/or significant heart rate changes.   Clinical Course as of 10/19/23 1518  Mon Oct 19, 2023  1325 Called daughter who says that last normal that she saw her was several days ago.  Patient has had multiple strokes in the past, is at baseline patient is ambulatory, no focal weakness, does have  intermittent slurred speech.  States that she got called by facility that her mom was sweating a lot, very weak, blood pressures were low.  Called facility, her last normal was 640 in the morning when they saw her this morning.  Stated that she was ambulatory in the morning.  When they went to check in on her later, they found her on the floor, when they got up to walk, and they noted that her right lower extremity was weak and she was having some slurred speech and right facial droop that is new.   [  TT]  1406 Independent review of labs, no leukocytosis, electrolytes all severely deranged, LFTs are normal, troponin is not elevated, UA is not consistent with UTI. [TT]  1501 Patient noted to be desatting to 88%, was placed on 2 L with improvement to 94%.  X-ray was ordered as well as respiratory viral panel. [TT]    Clinical Course User Index [TT] Waymond, Lorelle Cummins, MD     FINAL CLINICAL IMPRESSION(S) / ED DIAGNOSES   Final diagnoses:  Altered mental status, unspecified altered mental status type  Hypoxia  Weakness of right lower extremity  Slurred speech     Rx / DC Orders   ED Discharge Orders     None        Note:  This document was prepared using Dragon voice recognition software and may include unintentional dictation errors.    Waymond Lorelle Cummins, MD 10/19/23 [phone]

## 2023-10-19 NOTE — Progress Notes (Signed)
 Per Dr Fausto, dc [facility] order

## 2023-10-19 NOTE — Consult Note (Signed)
 Full note to follow.   Imaging concerning for glioblastoma.  Given background medical issues, consider hospice given family wishes.

## 2023-10-20 ENCOUNTER — Observation Stay

## 2023-10-20 DIAGNOSIS — D330 Benign neoplasm of brain, supratentorial: Secondary | ICD-10-CM

## 2023-10-20 DIAGNOSIS — I5032 Chronic diastolic (congestive) heart failure: Secondary | ICD-10-CM | POA: Diagnosis present

## 2023-10-20 DIAGNOSIS — I2510 Atherosclerotic heart disease of native coronary artery without angina pectoris: Secondary | ICD-10-CM | POA: Diagnosis present

## 2023-10-20 DIAGNOSIS — F02818 Dementia in other diseases classified elsewhere, unspecified severity, with other behavioral disturbance: Secondary | ICD-10-CM | POA: Diagnosis present

## 2023-10-20 DIAGNOSIS — B9620 Unspecified Escherichia coli [E. coli] as the cause of diseases classified elsewhere: Secondary | ICD-10-CM | POA: Diagnosis present

## 2023-10-20 DIAGNOSIS — F3341 Major depressive disorder, recurrent, in partial remission: Secondary | ICD-10-CM | POA: Diagnosis present

## 2023-10-20 DIAGNOSIS — G8929 Other chronic pain: Secondary | ICD-10-CM | POA: Diagnosis present

## 2023-10-20 DIAGNOSIS — G4700 Insomnia, unspecified: Secondary | ICD-10-CM | POA: Diagnosis present

## 2023-10-20 DIAGNOSIS — D496 Neoplasm of unspecified behavior of brain: Secondary | ICD-10-CM | POA: Diagnosis not present

## 2023-10-20 DIAGNOSIS — E785 Hyperlipidemia, unspecified: Secondary | ICD-10-CM | POA: Diagnosis present

## 2023-10-20 DIAGNOSIS — R4781 Slurred speech: Secondary | ICD-10-CM | POA: Diagnosis present

## 2023-10-20 DIAGNOSIS — C719 Malignant neoplasm of brain, unspecified: Secondary | ICD-10-CM | POA: Diagnosis present

## 2023-10-20 DIAGNOSIS — F01518 Vascular dementia, unspecified severity, with other behavioral disturbance: Secondary | ICD-10-CM | POA: Diagnosis present

## 2023-10-20 DIAGNOSIS — G40909 Epilepsy, unspecified, not intractable, without status epilepticus: Secondary | ICD-10-CM | POA: Diagnosis present

## 2023-10-20 DIAGNOSIS — Z1152 Encounter for screening for COVID-19: Secondary | ICD-10-CM | POA: Diagnosis not present

## 2023-10-20 DIAGNOSIS — E876 Hypokalemia: Secondary | ICD-10-CM | POA: Diagnosis present

## 2023-10-20 DIAGNOSIS — F17210 Nicotine dependence, cigarettes, uncomplicated: Secondary | ICD-10-CM | POA: Diagnosis present

## 2023-10-20 DIAGNOSIS — E1169 Type 2 diabetes mellitus with other specified complication: Secondary | ICD-10-CM | POA: Diagnosis present

## 2023-10-20 DIAGNOSIS — I110 Hypertensive heart disease with heart failure: Secondary | ICD-10-CM | POA: Diagnosis present

## 2023-10-20 DIAGNOSIS — R7881 Bacteremia: Secondary | ICD-10-CM | POA: Diagnosis present

## 2023-10-20 DIAGNOSIS — G20A1 Parkinson's disease without dyskinesia, without mention of fluctuations: Secondary | ICD-10-CM | POA: Diagnosis present

## 2023-10-20 DIAGNOSIS — J4489 Other specified chronic obstructive pulmonary disease: Secondary | ICD-10-CM | POA: Diagnosis present

## 2023-10-20 DIAGNOSIS — G9389 Other specified disorders of brain: Secondary | ICD-10-CM | POA: Diagnosis not present

## 2023-10-20 DIAGNOSIS — G9341 Metabolic encephalopathy: Secondary | ICD-10-CM | POA: Diagnosis present

## 2023-10-20 DIAGNOSIS — Z66 Do not resuscitate: Secondary | ICD-10-CM | POA: Diagnosis present

## 2023-10-20 DIAGNOSIS — Z1612 Extended spectrum beta lactamase (ESBL) resistance: Secondary | ICD-10-CM | POA: Diagnosis present

## 2023-10-20 DIAGNOSIS — I480 Paroxysmal atrial fibrillation: Secondary | ICD-10-CM | POA: Diagnosis present

## 2023-10-20 DIAGNOSIS — E039 Hypothyroidism, unspecified: Secondary | ICD-10-CM | POA: Diagnosis present

## 2023-10-20 DIAGNOSIS — G9340 Encephalopathy, unspecified: Secondary | ICD-10-CM | POA: Diagnosis present

## 2023-10-20 DIAGNOSIS — R4182 Altered mental status, unspecified: Secondary | ICD-10-CM | POA: Diagnosis not present

## 2023-10-20 LAB — GLUCOSE, CAPILLARY
Glucose-Capillary: 168 mg/dL — ABNORMAL HIGH (ref 70–99)
Glucose-Capillary: 200 mg/dL — ABNORMAL HIGH (ref 70–99)
Glucose-Capillary: 176 mg/dL — ABNORMAL HIGH (ref 70–99)
Glucose-Capillary: 160 mg/dL — ABNORMAL HIGH (ref 70–99)

## 2023-10-20 LAB — RESPIRATORY PANEL BY PCR

## 2023-10-20 LAB — URINE DRUG SCREEN, QUALITATIVE (ARMC ONLY)
Amphetamines, Ur Screen: NOT DETECTED
Barbiturates, Ur Screen: NOT DETECTED
Benzodiazepine, Ur Scrn: NOT DETECTED
Cannabinoid 50 Ng, Ur ~~LOC~~: NOT DETECTED
Cocaine Metabolite,Ur ~~LOC~~: NOT DETECTED
MDMA (Ecstasy)Ur Screen: NOT DETECTED
Methadone Scn, Ur: NOT DETECTED
Opiate, Ur Screen: NOT DETECTED
Phencyclidine (PCP) Ur S: NOT DETECTED
Tricyclic, Ur Screen: NOT DETECTED

## 2023-10-20 LAB — PROCALCITONIN: Procalcitonin: 0.61 ng/mL

## 2023-10-20 MED ORDER — TECHNETIUM TO 99M ALBUMIN AGGREGATED
4.3600 | Freq: Once | INTRAVENOUS | Status: AC | PRN
  Administered 2023-10-20: 4.36 via INTRAVENOUS

## 2023-10-20 MED ORDER — PIPERACILLIN-TAZOBACTAM 3.375 G IVPB
3.3750 g | Freq: Three times a day (TID) | INTRAVENOUS | Status: DC
  Administered 2023-10-20 (×2): 3.375 g via INTRAVENOUS
  Filled 2023-10-20 (×2): qty 50

## 2023-10-20 MED ORDER — LACTATED RINGERS IV SOLN: INTRAVENOUS | Status: AC

## 2023-10-20 MED ORDER — ONDANSETRON HCL 4 MG/2ML IJ SOLN
4.0000 mg | Freq: Four times a day (QID) | INTRAMUSCULAR | Status: DC | PRN
  Administered 2023-10-20: 4 mg via INTRAVENOUS
  Filled 2023-10-20: qty 2

## 2023-10-20 NOTE — Care Management Obs Status (Signed)
 MEDICARE OBSERVATION STATUS NOTIFICATION   Patient Details  Name: Samantha Aguirre MRN: [id number] Date of Birth: 1945/11/20   Medicare Observation Status Notification Given:  Chaney BRANDY CHRISTIANE LELON, CMA 10/20/2023, 11:51 AM

## 2023-10-20 NOTE — Op Note (Addendum)
 Error

## 2023-10-20 NOTE — Progress Notes (Signed)
 PT Cancellation Note  Patient Details Name: Samantha Aguirre MRN: [id number] DOB: 1945/02/11   Cancelled Treatment:    Reason Eval/Treat Not Completed: Other (comment): Upon entry pt found asleep in bed covered in emesis.RN notified immediately and in to assess. Will follow up as appropriate/time allows.    CHARM Glendia Bertin PT, DPT 10/20/23, 10:19 AM

## 2023-10-20 NOTE — Progress Notes (Signed)
 PT Cancellation Note  Patient Details Name: [name] MRN: [id number] DOB: August 08, 1945   Cancelled Treatment:    Reason Eval/Treat Not Completed: Other (comment): Per conversation with nursing pt with multiple instances of nausea/vomiting with positional changes this date.  Will hold PT evaluation at this time and attempt to see pt at a future date/time as medically appropriate.     CHARM Glendia Bertin PT, DPT 10/20/23, 1:39 PM

## 2023-10-20 NOTE — Consult Note (Signed)
 Consult requested by:  Dr. Floy  Consult requested for:  New brain mass  Primary Physician:  Brutus Delon SAUNDERS, NP  History of Present Illness: 10/20/2023 [name] is here with a chief complaint of altered mental status.  She has history of dementia and lives in a facility.  She was found to have weakness and slurred speech and was brought to the emergency department for evaluation.  She had been having worsening behavioral disturbances and was seen in the outpatient setting by her neurologist just last week.  The emergency department obtain imaging which showed findings concerning for new intracranial mass.  The patient denies headache, nausea, or vomiting currently.  She is unable to provide a full history.  The symptoms are causing a significant impact on the patient's life.   I have utilized the care everywhere function in epic to review the outside records available from external health systems.  Review of Systems:  unobtainable  Past Medical History: Past Medical History:  Diagnosis Date   Acid reflux    Arthritis    Bilateral knees, hands, feet   Asthma    Atrial fibrillation (HCC)    Cancer (HCC)    CHF (congestive heart failure) (HCC)    Diastolic CHF   COPD (chronic obstructive pulmonary disease) (HCC)    Coronary artery disease    Diabetes mellitus without complication (HCC)    Frequent headaches    GI bleed    HLD (hyperlipidemia)    Hypertension    Seizures (HCC)    Skin cancer 2015   Suspected basal cell carcinoma on nose, surgically removed   Stroke (HCC) 04/2017   Thyroid  disease    Tremors of nervous system     Past Surgical History: Past Surgical History:  Procedure Laterality Date   ABDOMINAL HYSTERECTOMY  1976   APPENDECTOMY  1980   BACK SURGERY     cardiac stents     CAROTID ENDARTERECTOMY     CHOLECYSTECTOMY  1980   COLONOSCOPY N/A 02/20/2017   Procedure: COLONOSCOPY;  Surgeon: Unk Corinn Skiff, MD;  Location: [facility]  ENDOSCOPY;  Service: Gastroenterology;  Laterality: N/A;   ESOPHAGOGASTRODUODENOSCOPY N/A 02/20/2017   Procedure: ESOPHAGOGASTRODUODENOSCOPY (EGD);  Surgeon: Unk Corinn Skiff, MD;  Location: [facility] ENDOSCOPY;  Service: Gastroenterology;  Laterality: N/A;   ESOPHAGOGASTRODUODENOSCOPY (EGD) WITH PROPOFOL  N/A 07/08/2017   Procedure: ESOPHAGOGASTRODUODENOSCOPY (EGD) WITH PROPOFOL ;  Surgeon: Jinny Carmine, MD;  Location: [facility] ENDOSCOPY;  Service: Endoscopy;  Laterality: N/A;   GALLBLADDER SURGERY  1980   GIVENS CAPSULE STUDY N/A 07/09/2017   Procedure: GIVENS CAPSULE STUDY;  Surgeon: Therisa Bi, MD;  Location: [facility] ENDOSCOPY;  Service: Gastroenterology;  Laterality: N/A;   KNEE SURGERY     left   KYPHOPLASTY N/A 03/16/2018   Procedure: KYPHOPLASTY, L5;  Surgeon: Kathlynn Sharper, MD;  Location: [facility] ORS;  Service: Orthopedics;  Laterality: N/A;   REVERSE SHOULDER ARTHROPLASTY Right 08/25/2019   Procedure: REVERSE SHOULDER ARTHROPLASTY;  Surgeon: Edie Norleen PARAS, MD;  Location: [facility] ORS;  Service: Orthopedics;  Laterality: Right;   ROTATOR CUFF REPAIR     right   TONSILLECTOMY  1950    Allergies: Allergies as of 10/19/2023 - Review Complete 10/19/2023  Allergen Reaction Noted   Ivp dye [iodinated contrast media] Itching 09/16/2015   Methotrexate Rash 07/04/2015   Morphine  and codeine Other (See Comments) 08/16/2015   Mushroom extract complex (obsolete) Anaphylaxis 08/16/2015   Atenolol Other (See Comments) 08/16/2015   Percocet [oxycodone-acetaminophen ] Nausea And Vomiting and Other (  See Comments) 08/16/2015   Percodan [oxycodone-aspirin ] Nausea And Vomiting and Other (See Comments) 08/16/2015   Tramadol  Nausea Only 09/23/2017   Verapamil Other (See Comments) 08/16/2015   Aspirin   12/08/2022   Codeine  12/08/2022   Oxycodone hcl  07/05/2014    Medications: Current Meds  Medication Sig   acetaminophen  (TYLENOL ) 500 MG tablet Take 1 tablet (500 mg total) by mouth every 6 (six) hours as needed.    albuterol  (PROVENTIL  HFA;VENTOLIN  HFA) 108 (90 Base) MCG/ACT inhaler Inhale 2 puffs into the lungs every 6 (six) hours as needed for wheezing or shortness of breath.   atorvastatin  (LIPITOR ) 10 MG tablet Take 10 mg by mouth daily.   BREO ELLIPTA  100-25 MCG/ACT AEPB Inhale 1 puff into the lungs daily.   Calcium  Carbonate 500 MG CHEW Chew 2 tablets by mouth every 4 (four) hours as needed (if no improvement in chest pain with tums).   carbidopa-levodopa (SINEMET IR) 25-100 MG tablet Take 1 tablet by mouth 3 (three) times daily.   Cholecalciferol  50 MCG (2000 UT) TABS Take 2,000 Units by mouth daily.   clopidogrel  (PLAVIX ) 75 MG tablet Take 1 tablet (75 mg total) by mouth daily.   Corn Starch (ZEASORB, CORN STARCH,) POWD Apply topically daily. Apply to intertrigo daily   diclofenac  Sodium (VOLTAREN ) 1 % GEL Apply 2 g topically 3 (three) times daily.   DULoxetine  (CYMBALTA ) 60 MG capsule Take 1 capsule (60 mg total) by mouth daily.   ELIQUIS  5 MG TABS tablet Take 5 mg by mouth 2 (two) times daily.   ferrous sulfate  325 (65 FE) MG EC tablet Take 1 tablet (325 mg total) by mouth daily.   folic acid  (FOLVITE ) 1 MG tablet Take 1 tablet (1 mg total) by mouth daily.   furosemide  (LASIX ) 20 MG tablet Take 20 mg by mouth daily.    gabapentin  (NEURONTIN ) 400 MG capsule Take 1 capsule (400 mg total) by mouth 3 (three) times daily.   glipiZIDE  (GLUCOTROL ) 5 MG tablet Take 5 mg by mouth 2 (two) times daily.   hydrocortisone cream 1 % Apply 1 Application topically 2 (two) times daily as needed for itching.   levETIRAcetam  (KEPPRA ) 500 MG tablet Take 500 mg by mouth 2 (two) times daily.   levothyroxine  (SYNTHROID ) 125 MCG tablet Take 125 mcg by mouth daily.   lisinopril  (ZESTRIL ) 10 MG tablet Take 10 mg by mouth daily.   LORazepam  (ATIVAN ) 0.5 MG tablet Take 0.5 mg by mouth 2 (two) times daily. 8am and 4pm   MAGNESIUM  PO Take 500 mg by mouth daily.   Melatonin 10 MG TABS Take 10 mg by mouth at bedtime.    metFORMIN  (GLUCOPHAGE ) 500 MG tablet Take 500 mg by mouth 2 (two) times daily.   mirtazapine  (REMERON  SOL-TAB) 15 MG disintegrating tablet Take 1 tablet (15 mg total) by mouth at bedtime.   nystatin (MYCOSTATIN/NYSTOP) powder Apply 1 Application topically 2 (two) times daily as needed (irritation).   ondansetron  (ZOFRAN ) 4 MG tablet Take 4 mg by mouth every 6 (six) hours as needed for nausea.   pantoprazole  (PROTONIX ) 40 MG tablet Take 40 mg by mouth daily.   polyethylene glycol (MIRALAX  / GLYCOLAX ) 17 g packet Take 17 g by mouth daily as needed for mild constipation.   potassium chloride  SA (KLOR-CON  M) 20 MEQ tablet Take 20 mEq by mouth daily.   risperiDONE (RISPERDAL) 0.5 MG tablet Take 0.5 mg by mouth 2 (two) times daily.   senna (SENOKOT) 8.6 MG TABS tablet  Take 2 tablets by mouth daily.   triamcinolone  cream (KENALOG ) 0.1 % Apply topically 2 (two) times daily as needed.    Social History: Social History   Tobacco Use   Smoking status: Every Day    Current packs/day: 1.00    Average packs/day: 1 pack/day for 53.0 years (53.0 ttl pk-yrs)    Types: Cigarettes   Smokeless tobacco: Never  Vaping Use   Vaping status: Former  Substance Use Topics   Alcohol use: No   Drug use: No    Family Medical History: Family History  Problem Relation Age of Onset   Breast cancer Mother    Heart disease Mother    Stroke Mother    Cancer Mother    COPD Mother    Diabetes Mother    Heart disease Father    Diabetes Father    Stroke Father    Alcohol abuse Sister    Drug abuse Sister    Stroke Sister    Cancer Sister    Mental illness Sister    Heart disease Brother    Arthritis Brother    Diabetes Brother    Heart disease Maternal Grandfather    Heart disease Paternal Grandfather     Physical Examination: Vitals:   10/19/23 1847 10/20/23 0536  BP: (!) 106/59 (!) 116/46  Pulse: 79 78  Resp: 19 18  Temp: (!) 100.5 F (38.1 C) 99 F (37.2 C)  SpO2: 100% 94%     General: Patient is in no apparent distress.   Her speech is sparse.  She opens eyes spontaneously.  She regards.  She follows simple commands.  She is oriented to self, but not to place or year.  Pupils equal round reactive to light.  Extraocular movements intact.  No obvious facial droop.  Tongue protrudes midline.  She moves all extremities, but does not participate with full strength examination or with pronator drift.  Sensory examination is unreliable.   Gait is untested.     Medical Decision Making  Imaging: MR Brain 10/19/2023 IMPRESSION: 1. Intermittently motion degraded examination. 2. New from the prior brain MRI of 05/17/2023, there is masslike enlargement of the callosal splenium extending to the medial parietal lobes. Associated ill-defined diffusion-weighted signal abnormality at this site. Prominent surrounding T2 FLAIR hyperintense signal abnormality within the bilateral parietooccipital white matter. Differential considerations include a high-grade primary CNS neoplasm (such as glioblastoma multiforme) and lymphoma. Tumefactive demyelination is considered less likely. Post-contrast MR imaging of the brain is recommended for further evaluation. 3. Unchanged 1 cm meningioma overlying the high right frontal lobe. 4. Redemonstrated chronic cortically-based left MCA territory infarct at the posterior temporal lobe, temporoparietal junction and occipital lobe. 5. Background advanced cerebral white matter disease. 6. Unchanged small chronic infarcts within the bilateral cerebellar hemispheres. 7. Generalized parenchymal atrophy.     Electronically Signed   By: Rockey Childs D.O.   On: 10/19/2023 15:53  I have personally reviewed the images and agree with the above interpretation.  Assessment and Plan: Ms. Crull is a pleasant 78 y.o. female with new lesion found on MRI scan of the brain yesterday.  On my evaluation, this seems most consistent with a  primary glial neoplasm such as glioblastoma.  Given her underlying dementia, I would strongly consider engagement of hospice services.  If family desires full treatment, could consider biopsy.  This lesion is unresectable due to its anatomic location.  I have communicated my recommendations to the requesting physician and coordinated care to facilitate  these recommendations.     Evianna Chandran K. Clois MD, [facility] Neurosurgery

## 2023-10-20 NOTE — Plan of Care (Signed)
 Patient vomiting and nauseous today. After vomiting patient has been tired. Unable to safely eat/drink d/t coughing and aspiration.   Problem: Education: Goal: Ability to describe self-care measures that may prevent or decrease complications (Diabetes Survival Skills Education) will improve Outcome: Progressing Goal: Individualized Educational Video(s) Outcome: Progressing   Problem: Coping: Goal: Ability to adjust to condition or change in health will improve Outcome: Progressing   Problem: Health Behavior/Discharge Planning: Goal: Ability to identify and utilize available resources and services will improve Outcome: Progressing Goal: Ability to manage health-related needs will improve Outcome: Progressing   Problem: Metabolic: Goal: Ability to maintain appropriate glucose levels will improve Outcome: Progressing   Problem: Nutritional: Goal: Progress toward achieving an optimal weight will improve Outcome: Progressing   Problem: Skin Integrity: Goal: Risk for impaired skin integrity will decrease Outcome: Progressing   Problem: Tissue Perfusion: Goal: Adequacy of tissue perfusion will improve Outcome: Progressing   Problem: Education: Goal: Knowledge of General Education information will improve Description: Including pain rating scale, medication(s)/side effects and non-pharmacologic comfort measures Outcome: Progressing   Problem: Health Behavior/Discharge Planning: Goal: Ability to manage health-related needs will improve Outcome: Progressing   Problem: Clinical Measurements: Goal: Ability to maintain clinical measurements within normal limits will improve Outcome: Progressing Goal: Diagnostic test results will improve Outcome: Progressing Goal: Respiratory complications will improve Outcome: Progressing Goal: Cardiovascular complication will be avoided Outcome: Progressing   Problem: Coping: Goal: Level of anxiety will decrease Outcome: Progressing    Problem: Elimination: Goal: Will not experience complications related to bowel motility Outcome: Progressing Goal: Will not experience complications related to urinary retention Outcome: Progressing   Problem: Pain Managment: Goal: General experience of comfort will improve and/or be controlled Outcome: Progressing   Problem: Safety: Goal: Ability to remain free from injury will improve Outcome: Progressing   Problem: Skin Integrity: Goal: Risk for impaired skin integrity will decrease Outcome: Progressing   Problem: Fluid Volume: Goal: Ability to maintain a balanced intake and output will improve Outcome: Not Progressing   Problem: Nutritional: Goal: Maintenance of adequate nutrition will improve Outcome: Not Progressing   Problem: Clinical Measurements: Goal: Will remain free from infection Outcome: Not Progressing   Problem: Activity: Goal: Risk for activity intolerance will decrease Outcome: Not Progressing   Problem: Nutrition: Goal: Adequate nutrition will be maintained Outcome: Not Progressing

## 2023-10-20 NOTE — Consult Note (Signed)
 NEUROLOGY CONSULT NOTE   Date of service: October 20, 2023 Patient Name: Samantha Aguirre MRN:  [id number] DOB:  1945/11/27 Chief Complaint: Lethargy Requesting Provider: Jerelene Critchley, MD  History of Present Illness  Samantha Aguirre is a 78 y.o. female with hx of dementia, atrial fibrillation, diabetes, hypertension, seizure who presented with slurred speech and altered mental status.  Shortly before my entering the room, she had worsening of her mental status, now has a fever of 103.   Past History   Past Medical History:  Diagnosis Date   Acid reflux    Arthritis    Bilateral knees, hands, feet   Asthma    Atrial fibrillation (HCC)    Cancer (HCC)    CHF (congestive heart failure) (HCC)    Diastolic CHF   COPD (chronic obstructive pulmonary disease) (HCC)    Coronary artery disease    Diabetes mellitus without complication (HCC)    Frequent headaches    GI bleed    HLD (hyperlipidemia)    Hypertension    Seizures (HCC)    Skin cancer 2015   Suspected basal cell carcinoma on nose, surgically removed   Stroke (HCC) 04/2017   Thyroid  disease    Tremors of nervous system     Past Surgical History:  Procedure Laterality Date   ABDOMINAL HYSTERECTOMY  1976   APPENDECTOMY  1980   BACK SURGERY     cardiac stents     CAROTID ENDARTERECTOMY     CHOLECYSTECTOMY  1980   COLONOSCOPY N/A 02/20/2017   Procedure: COLONOSCOPY;  Surgeon: Unk Corinn Skiff, MD;  Location: [facility] ENDOSCOPY;  Service: Gastroenterology;  Laterality: N/A;   ESOPHAGOGASTRODUODENOSCOPY N/A 02/20/2017   Procedure: ESOPHAGOGASTRODUODENOSCOPY (EGD);  Surgeon: Unk Corinn Skiff, MD;  Location: [facility] ENDOSCOPY;  Service: Gastroenterology;  Laterality: N/A;   ESOPHAGOGASTRODUODENOSCOPY (EGD) WITH PROPOFOL  N/A 07/08/2017   Procedure: ESOPHAGOGASTRODUODENOSCOPY (EGD) WITH PROPOFOL ;  Surgeon: Jinny Carmine, MD;  Location: [facility] ENDOSCOPY;  Service: Endoscopy;  Laterality: N/A;   GALLBLADDER SURGERY  1980    GIVENS CAPSULE STUDY N/A 07/09/2017   Procedure: GIVENS CAPSULE STUDY;  Surgeon: Therisa Bi, MD;  Location: [facility] ENDOSCOPY;  Service: Gastroenterology;  Laterality: N/A;   KNEE SURGERY     left   KYPHOPLASTY N/A 03/16/2018   Procedure: KYPHOPLASTY, L5;  Surgeon: Kathlynn Sharper, MD;  Location: [facility] ORS;  Service: Orthopedics;  Laterality: N/A;   REVERSE SHOULDER ARTHROPLASTY Right 08/25/2019   Procedure: REVERSE SHOULDER ARTHROPLASTY;  Surgeon: Edie Norleen PARAS, MD;  Location: [facility] ORS;  Service: Orthopedics;  Laterality: Right;   ROTATOR CUFF REPAIR     right   TONSILLECTOMY  1950    Family History: Family History  Problem Relation Age of Onset   Breast cancer Mother    Heart disease Mother    Stroke Mother    Cancer Mother    COPD Mother    Diabetes Mother    Heart disease Father    Diabetes Father    Stroke Father    Alcohol abuse Sister    Drug abuse Sister    Stroke Sister    Cancer Sister    Mental illness Sister    Heart disease Brother    Arthritis Brother    Diabetes Brother    Heart disease Maternal Grandfather    Heart disease Paternal Grandfather     Social History  reports that she has been smoking cigarettes. She has a 53 pack-year smoking history. She has never used smokeless tobacco. She reports that she  does not drink alcohol and does not use drugs.  Allergies  Allergen Reactions   Ivp Dye [Iodinated Contrast Media] Itching    Pt injected with IV contrast.  5 min after injection pt complained of itching behind ear and on her abd.   Methotrexate Rash   Morphine  And Codeine Other (See Comments)    stops my breathing NEAR-RESPIRATORY ARREST   Mushroom Extract Complex (Obsolete) Anaphylaxis    Made patient deathly sick   Atenolol Other (See Comments)    MD took me off of it bc it was doing something wrong Made patient HYPOTENSIVE   Percocet [Oxycodone-Acetaminophen ] Nausea And Vomiting and Other (See Comments)    Stomach pains   Percodan  [Oxycodone-Aspirin ] Nausea And Vomiting and Other (See Comments)    Stomach pains   Tramadol  Nausea Only   Verapamil Other (See Comments)     MD told me this was screwing me up MAKES PATIENT HYPOTENSIVE   Aspirin     Codeine    Oxycodone Hcl     Medications   Current Facility-Administered Medications:    acetaminophen  (TYLENOL ) tablet 650 mg, 650 mg, Oral, Q6H PRN **OR** acetaminophen  (TYLENOL ) suppository 650 mg, 650 mg, Rectal, Q6H PRN, Fausto Sor A, DO   albuterol  (PROVENTIL ) (2.5 MG/3ML) 0.083% nebulizer solution 2.5 mg, 2.5 mg, Nebulization, Q6H PRN, Fausto Sor A, DO   apixaban  (ELIQUIS ) tablet 5 mg, 5 mg, Oral, BID, Fausto Sor A, DO, 5 mg at 10/20/23 0900   atorvastatin  (LIPITOR ) tablet 10 mg, 10 mg, Oral, Daily, Fausto Sor A, DO, 10 mg at 10/20/23 0859   carbidopa-levodopa (SINEMET IR) 25-100 MG per tablet immediate release 1 tablet, 1 tablet, Oral, TID, Fausto Sor A, DO, 1 tablet at 10/20/23 0900   clopidogrel  (PLAVIX ) tablet 75 mg, 75 mg, Oral, Daily, Fausto Sor A, DO, 75 mg at 10/20/23 0900   DULoxetine  (CYMBALTA ) DR capsule 60 mg, 60 mg, Oral, Daily, Fausto Sor A, DO, 60 mg at 10/20/23 9140   fluticasone  furoate-vilanterol (BREO ELLIPTA ) 100-25 MCG/ACT 1 puff, 1 puff, Inhalation, Daily, Fausto Sor A, DO   insulin  aspart (novoLOG ) injection 0-9 Units, 0-9 Units, Subcutaneous, TID WC, Fausto Sor A, DO, 2 Units at 10/20/23 9141   levETIRAcetam  (KEPPRA ) undiluted injection 500 mg, 500 mg, Intravenous, BID, Fausto Sor A, DO, 500 mg at 10/20/23 0909   levothyroxine  (SYNTHROID ) tablet 125 mcg, 125 mcg, Oral, Daily, Fausto Sor LABOR, DO, 125 mcg at 10/20/23 9374   LORazepam  (ATIVAN ) injection 0.5 mg, 0.5 mg, Intravenous, Q6H PRN, Fausto Sor A, DO   melatonin tablet 10 mg, 10 mg, Oral, QHS, Fausto Sor A, DO, 10 mg at 10/19/23 2202   mirtazapine  (REMERON  SOL-TAB) disintegrating tablet 15 mg, 15 mg, Oral, QHS, Griffith, Kelly A,  DO, 15 mg at 10/19/23 2204   ondansetron  (ZOFRAN ) injection 4 mg, 4 mg, Intravenous, Q6H PRN, Ponnala, Shruthi, MD, 4 mg at 10/20/23 1137   pantoprazole  (PROTONIX ) EC tablet 40 mg, 40 mg, Oral, Daily, Fausto Sor A, DO, 40 mg at 10/20/23 0859   piperacillin-tazobactam (ZOSYN) IVPB 3.375 g, 3.375 g, Intravenous, Q8H, Ponnala, Shruthi, MD   polyethylene glycol (MIRALAX  / GLYCOLAX ) packet 17 g, 17 g, Oral, Daily PRN, Fausto Sor A, DO   risperiDONE (RISPERDAL) tablet 0.5 mg, 0.5 mg, Oral, BID, Fausto Sor A, DO, 0.5 mg at 10/20/23 0900   sorbitol 70 % solution 30 mL, 30 mL, Oral, Daily PRN, Fausto Sor LABOR, DO  Vitals   Vitals:   10/19/23 1847 10/19/23 2010  10/20/23 0536 10/20/23 0752  BP: (!) 106/59  (!) 116/46 (!) 121/49  Pulse: 79  78 75  Resp: 19  18 18   Temp: (!) 100.5 F (38.1 C)  99 F (37.2 C) 99.1 F (37.3 C)  TempSrc:    Oral  SpO2: 100%  94% 97%  Weight:      Height:  5' 1 (1.549 m)      Body mass index is 32.32 kg/m.   Physical Exam   Constitutional: Appears well-developed and well-nourished.   Neurologic Examination    Neuro: Mental Status: Patient will awaken, and tell me her name, but she is lethargic, follows some commands but not reliably Cranial Nerves: II: She does not blink to threat pupils are equal, round, and reactive to light.   III,IV, VI: She has a rightward gaze preference, but does come back towards midline.  I do not see her cross midline to the left Motor: She moves all extremities spontaneously and to noxious stimulation, but does not participate in formal strength testing  sensory: She response to noxious stimulation bilaterally  Cerebellar: Does not perform   Labs/Imaging/Neurodiagnostic studies   CBC:  Recent Labs  Lab 2023-11-04 1304  WBC 8.2  NEUTROABS 7.2  HGB 11.0*  HCT 34.6*  MCV 90.1  PLT 236   Basic Metabolic Panel:  Lab Results  Component Value Date   NA 138 2023/11/04   K 3.7 11-04-2023   CO2 23  11-04-23   GLUCOSE 172 (H) 2023-11-04   BUN 13 2023/11/04   CREATININE 0.56 04-Nov-2023   CALCIUM  8.4 (L) 11-04-23   GFRNONAA >60 04-Nov-2023   GFRAA >60 09/12/2019   Lipid Panel:  Lab Results  Component Value Date   LDLCALC 94 10/22/2022   HgbA1c:  Lab Results  Component Value Date   HGBA1C 5.8 (H) 05/09/2022   Urine Drug Screen:     Component Value Date/Time   LABOPIA NONE DETECTED Nov 04, 2023 1325   LABOPIA NONE DETECTED 09/22/2017 1940   COCAINSCRNUR NONE DETECTED 11/04/2023 1325   LABBENZ NONE DETECTED Nov 04, 2023 1325   LABBENZ NONE DETECTED 09/22/2017 1940   AMPHETMU NONE DETECTED 04-Nov-2023 1325   AMPHETMU NONE DETECTED 09/22/2017 1940   THCU NONE DETECTED Nov 04, 2023 1325   THCU NONE DETECTED 09/22/2017 1940   LABBARB NONE DETECTED 11-04-23 1325   LABBARB NONE DETECTED 09/22/2017 1940    Alcohol Level     Component Value Date/Time   ETH <15 2023/11/04 1304   INR  Lab Results  Component Value Date   INR 1.3 (H) 11/04/23   APTT  Lab Results  Component Value Date   APTT 32 Nov 04, 2023    MRI Brain(Personally reviewed): Large mass, very likely neoplastic  ASSESSMENT   Samantha Aguirre is a 78 y.o. female with a history of dementia who presents with worsening mental status and is found to have what is very likely a high-grade neoplasm on MRI.  I agree with Dr. Katrina that hospice would be an appropriate next step.  With her being able to arouse and follow commands, my suspicion for ongoing seizure activity is low, and she is looking towards her lesion currently I suspect her worsening this morning was more due to her fever in the setting of a large brain neoplasm as opposed to seizure.  While she is in the hospital, could continue Keppra  IV, or changed over to liquid or tablets if she is able to take them.  RECOMMENDATIONS  Continue Keppra  500 mg twice daily If she  is able to take p.o., would also restart gabapentin  400 3 times daily Palliative  care consultation Neurology will continue to be available on an as needed basis, please call if further questions or concerns. ______________________________________________________________________    Bonney Aisha Seals, MD [facility]

## 2023-10-20 NOTE — Progress Notes (Signed)
 OT Cancellation Note  Patient Details Name: Nakya Weyand MRN: [id number] DOB: [birth year]   Cancelled Treatment:    Reason Eval/Treat Not Completed: Other (comment). Order received, chart reviewed. Upon entry of PT/OT, pt found asleep in bed covered in emesis.RN notified immediately and in to assess. Will follow up as appropriate/time allows.  Ellwood Steidle E Reighn Kaplan 10/20/2023, 10:11 AM

## 2023-10-20 NOTE — Progress Notes (Signed)
 PROGRESS NOTE    Samantha Aguirre  FMW:[id number] DOB: 28-Jun-1945 DOA: 10/19/2023 PCP: Brutus Delon SAUNDERS, NP  Chief Complaint  Patient presents with   Altered Mental Status    Hospital Course:  Samantha Aguirre is a 78 y.o. female with medical history significant of parkinsonism, vascular dementia with behavioral disturbances, prior strokes, COPD presented from her assisted living facility due to altered mental status, slurred speech and right leg weakness.  Patient had recent behavioral problems, risperidone dose was increased.  Further workup showed imaging with new masslike enlargement of the callosal splenium, likely CNS neoplasm.   Subjective: Patient was examined at the bedside, new to me today. Was found to be confused, minimally responsive, febrile this morning  Met daughter, other family members at the bedside.  Discussed updates, plan to go ALF with hospice   Objective: Vitals:   10/19/23 1700 10/19/23 1847 10/19/23 2010 10/20/23 0536  BP: (!) 158/120 (!) 106/59  (!) 116/46  Pulse:  79  78  Resp: 18 19  18   Temp:  (!) 100.5 F (38.1 C)  99 F (37.2 C)  TempSrc:      SpO2:  100%  94%  Weight: 77.6 kg     Height:   5' 1 (1.549 m)     Intake/Output Summary (Last 24 hours) at 10/20/2023 0749 Last data filed at 10/20/2023 0400 Gross per 24 hour  Intake 363.95 ml  Output --  Net 363.95 ml   Filed Weights   10/19/23 1700  Weight: 77.6 kg    Examination: General exam: Lethargic, no acute distress Respiratory system: CTAB diminished bases, no wheezes, rales or rhonchi, normal respiratory effort.  On 2 L [place] O2 Cardiovascular system: normal S1/S2, RRR, no JVD, murmurs, rubs, gallops, no pedal edema.   Gastrointestinal system: soft, NT, ND, no HSM felt, +bowel sounds Central nervous system: Lethargic.  Slurred speech, follows commands appropriately, moving all extremities  Assessment & Plan:  Altered mental status Strokelike symptoms -right lower extremity  weakness, slurred speech -stroke ruled out New brain mass -concerning for primary CNS neoplasm.  - Neurosurgery is unable to offer resection due to its anatomical location. Recommend hospice, if family desires can consider biopsy (family declined) - Seen by Neurology, appreciate recs - Supportive care - N.p.o, gentle IV fluids (family agreeable) - SLP re-eval tomorrow - Family would like to go ALF with hospice, TOC consulted - PT OT evaluations - Fall precautions   Acute hypoxia -without apparent respiratory symptoms.   O2 sat noted to drop as low as 72-88% on room air in the ED. chest x-ray without acute findings.  Viral PCR's negative Covid/flu/RSV.   Given concern for new malignancy, PE should be ruled out.  Patient has severe allergy to IV contrast. - Will obtain VQ scan -follow results - Supplement O2 as needed to keep sats above 90% - As needed albuterol  nebs  Fever - No leukocytosis, COVID/Flu/RSV neg, Encephalopathic - CXR neg, UA neg for UTI - Procal, RVP pending, Blood cultures sent - Empiric Zosyn, no source of infection   History of CVA  - Continue Eliquis  and Plavix    Parkinsonism  - Continue Sinemet   History of vascular dementia with behavioral disturbances  - Continue home Risperdal 0.5 mg twice daily - Delirium precautions   History of seizure-like activity On IV Kepra 500mg  bid, if able to take p.o. -Will restart gabapentin  400 TID   COPD -stable without acute exacerbation, no wheezing on exam - Continue Breo - Nebulized albuterol   as needed   Type 2 diabetes  -hold home glipizide  metformin  - Sliding scale NovoLog    Hyperlipidemia -continue Lipitor  10 mg daily   Hypothyroidism -continue Synthroid    Insomnia -continue home melatonin and Remeron    GERD -continue Protonix   DVT prophylaxis: Eliquis    Code Status: Limited: Do not attempt resuscitation (DNR) -DNR-LIMITED -Do Not Intubate/DNI  Disposition:  ALF with hospice  Consultants:   Neurosurgery Neurology  Procedures:  None  Antimicrobials:  Anti-infectives (From admission, onward)    None       Data Reviewed: I have personally reviewed following labs and imaging studies CBC: Recent Labs  Lab 10/19/23 1304  WBC 8.2  NEUTROABS 7.2  HGB 11.0*  HCT 34.6*  MCV 90.1  PLT 236   Basic Metabolic Panel: Recent Labs  Lab 10/19/23 1304  NA 138  K 3.7  CL 105  CO2 23  GLUCOSE 172*  BUN 13  CREATININE 0.56  CALCIUM  8.4*   GFR: Estimated Creatinine Clearance: 53.7 mL/min (by C-G formula based on SCr of 0.56 mg/dL). Liver Function Tests: Recent Labs  Lab 10/19/23 1304  AST 21  ALT <5  ALKPHOS 80  BILITOT 0.4  PROT 6.8  ALBUMIN 3.8   CBG: Recent Labs  Lab 10/19/23 1300 10/19/23 1857 10/19/23 1952  GLUCAP 158* 183* 178*    Recent Results (from the past 240 hours)  Resp panel by RT-PCR (RSV, Flu A&B, Covid) Anterior Nasal Swab     Status: None   Collection Time: 10/19/23  2:54 PM   Specimen: Anterior Nasal Swab  Result Value Ref Range Status   SARS Coronavirus 2 by RT PCR NEGATIVE NEGATIVE Final    Comment: (NOTE) SARS-CoV-2 target nucleic acids are NOT DETECTED.  The SARS-CoV-2 RNA is generally detectable in upper respiratory specimens during the acute phase of infection. The lowest concentration of SARS-CoV-2 viral copies this assay can detect is 138 copies/mL. A negative result does not preclude SARS-Cov-2 infection and should not be used as the sole basis for treatment or other patient management decisions. A negative result may occur with  improper specimen collection/handling, submission of specimen other than nasopharyngeal swab, presence of viral mutation(s) within the areas targeted by this assay, and inadequate number of viral copies(<138 copies/mL). A negative result must be combined with clinical observations, patient history, and epidemiological information. The expected result is Negative.  Fact Sheet for Patients:   BloggerCourse.com  Fact Sheet for Healthcare Providers:  SeriousBroker.it  This test is no t yet approved or cleared by the [place]  FDA and  has been authorized for detection and/or diagnosis of SARS-CoV-2 by FDA under an Emergency Use Authorization (EUA). This EUA will remain  in effect (meaning this test can be used) for the duration of the COVID-19 declaration under Section 564(b)(1) of the Act, 21 U.S.C.section 360bbb-3(b)(1), unless the authorization is terminated  or revoked sooner.       Influenza A by PCR NEGATIVE NEGATIVE Final   Influenza B by PCR NEGATIVE NEGATIVE Final    Comment: (NOTE) The Xpert Xpress SARS-CoV-2/FLU/RSV plus assay is intended as an aid in the diagnosis of influenza from Nasopharyngeal swab specimens and should not be used as a sole basis for treatment. Nasal washings and aspirates are unacceptable for Xpert Xpress SARS-CoV-2/FLU/RSV testing.  Fact Sheet for Patients: BloggerCourse.com  Fact Sheet for Healthcare Providers: SeriousBroker.it  This test is not yet approved or cleared by the [place]  FDA and has been authorized for detection and/or diagnosis of SARS-CoV-2  by FDA under an Emergency Use Authorization (EUA). This EUA will remain in effect (meaning this test can be used) for the duration of the COVID-19 declaration under Section 564(b)(1) of the Act, 21 U.S.C. section 360bbb-3(b)(1), unless the authorization is terminated or revoked.     Resp Syncytial Virus by PCR NEGATIVE NEGATIVE Final    Comment: (NOTE) Fact Sheet for Patients: BloggerCourse.com  Fact Sheet for Healthcare Providers: SeriousBroker.it  This test is not yet approved or cleared by the [place]  FDA and has been authorized for detection and/or diagnosis of SARS-CoV-2 by FDA under an Emergency Use  Authorization (EUA). This EUA will remain in effect (meaning this test can be used) for the duration of the COVID-19 declaration under Section 564(b)(1) of the Act, 21 U.S.C. section 360bbb-3(b)(1), unless the authorization is terminated or revoked.  Performed at [facility], [address] [place], [place] [address]      Radiology Studies: CT Head Wo Contrast Result Date: 10/19/2023 CLINICAL DATA:  Provided history: Mental status change, unknown cause. EXAM: CT HEAD WITHOUT CONTRAST CT CERVICAL SPINE WITHOUT CONTRAST TECHNIQUE: Multidetector CT imaging of the head and cervical spine was performed following the standard protocol without intravenous contrast. Multiplanar CT image reconstructions of the cervical spine were also generated. RADIATION DOSE REDUCTION: This exam was performed according to the departmental dose-optimization program which includes automated exposure control, adjustment of the mA and/or kV according to patient size and/or use of iterative reconstruction technique. COMPARISON:  Same-day brain MRI 10/19/2023. Cervical spine CT 05/04/2023. FINDINGS: CT HEAD FINDINGS Brain: Generalized cerebral and cerebellar atrophy. Please see same day brain MRI for a description of the abnormality affecting the callosal splenium and adjacent parietal and occipital lobes. Unchanged 11 mm partially calcified meningioma along the right aspect of the mid-to-posterior falx. No significant mass effect upon the underlying brain parenchyma. No underlying parenchymal edema. Redemonstrated chronic cortically-based left MCA territory infarct at the posterior temporal lobe, temporoparietal junction and occipital lobe. Unchanged chronic lacunar infarct within the ventral left thalamus. Background advanced cerebral white matter disease. Known small chronic infarcts within the bilateral cerebellar hemispheres. There is no acute intracranial hemorrhage. No demarcated cortical infarct. No extra-axial  fluid collection. No midline shift. Vascular: No hyperdense vessel.  Atherosclerotic calcifications. Skull: No calvarial fracture or aggressive osseous lesion. Sinuses/Orbits: No mass or acute finding within the imaged orbits. Trace mucosal thickening/fluid scattered within the bilateral ethmoid air cells. CT CERVICAL SPINE FINDINGS Alignment: Nonspecific straightening of the expected cervical or doses. Slight grade 1 anterolisthesis at C2-C3. Slight grade 1 retrolisthesis at C3-C4. Slight grade 1 anterolisthesis at C4-C5. 2 mm C5-C6 grade 1 anterolisthesis. Slight grade 1 anterolisthesis at C7-T1, T1-T2 and T2-T3. Skull base and vertebrae: The basion-dental and atlanto-dental intervals are maintained.No evidence of acute fracture to the cervical spine. Soft tissues and spinal canal: No prevertebral fluid or swelling. No visible canal hematoma. Disc levels: Cervical spondylosis with multilevel disc space narrowing, disc bulges/central disc protrusions, uncovertebral hypertrophy and facet arthropathy. Disc space narrowing is greatest at C3-C4 (advanced at this level). C3-C4 posterior disc osteophyte complex resulting in apparent mild-to-moderate spinal canal stenosis. Also at C3-C4, uncovertebral hypertrophy contributes to bilateral bony neural foraminal narrowing. Degenerative changes also present at the C1-C2 articulation. Upper chest: No consolidation within the imaged lung apices. No visible pneumothorax. IMPRESSION: CT head: 1. Please see same day brain MRI for a description of the abnormality affecting the callosal splenium and adjacent parietal and occipital lobes. Differential considerations include a high-grade primary  CNS neoplasm (such as glioblastoma multiforme) or lymphoma. Tumefactive demyelination is considered less likely. 2. Unchanged 11 mm calcified meningioma along the right aspect of the mid-to-posterior falx. 3. Redemonstrated chronic cortically-based left MCA territory infarct at the posterior  temporal lobe, temporoparietal junction and occipital lobe. 4. Redemonstrated chronic infarcts within the left thalamus and bilateral cerebellar hemispheres. 5. Background advanced cerebral white matter disease. CT cervical spine: 1. No evidence of an acute cervical spine fracture. 2. Nonspecific straightening of the expected cervical lordosis. 3. Mild multilevel grade 1 spondylolisthesis. 4. Cervical spondylosis as described. Electronically Signed   By: Rockey Childs D.O.   On: 10/19/2023 16:14   CT Cervical Spine Wo Contrast Result Date: 10/19/2023 CLINICAL DATA:  Provided history: Mental status change, unknown cause. EXAM: CT HEAD WITHOUT CONTRAST CT CERVICAL SPINE WITHOUT CONTRAST TECHNIQUE: Multidetector CT imaging of the head and cervical spine was performed following the standard protocol without intravenous contrast. Multiplanar CT image reconstructions of the cervical spine were also generated. RADIATION DOSE REDUCTION: This exam was performed according to the departmental dose-optimization program which includes automated exposure control, adjustment of the mA and/or kV according to patient size and/or use of iterative reconstruction technique. COMPARISON:  Same-day brain MRI 10/19/2023. Cervical spine CT 05/04/2023. FINDINGS: CT HEAD FINDINGS Brain: Generalized cerebral and cerebellar atrophy. Please see same day brain MRI for a description of the abnormality affecting the callosal splenium and adjacent parietal and occipital lobes. Unchanged 11 mm partially calcified meningioma along the right aspect of the mid-to-posterior falx. No significant mass effect upon the underlying brain parenchyma. No underlying parenchymal edema. Redemonstrated chronic cortically-based left MCA territory infarct at the posterior temporal lobe, temporoparietal junction and occipital lobe. Unchanged chronic lacunar infarct within the ventral left thalamus. Background advanced cerebral white matter disease. Known small  chronic infarcts within the bilateral cerebellar hemispheres. There is no acute intracranial hemorrhage. No demarcated cortical infarct. No extra-axial fluid collection. No midline shift. Vascular: No hyperdense vessel.  Atherosclerotic calcifications. Skull: No calvarial fracture or aggressive osseous lesion. Sinuses/Orbits: No mass or acute finding within the imaged orbits. Trace mucosal thickening/fluid scattered within the bilateral ethmoid air cells. CT CERVICAL SPINE FINDINGS Alignment: Nonspecific straightening of the expected cervical or doses. Slight grade 1 anterolisthesis at C2-C3. Slight grade 1 retrolisthesis at C3-C4. Slight grade 1 anterolisthesis at C4-C5. 2 mm C5-C6 grade 1 anterolisthesis. Slight grade 1 anterolisthesis at C7-T1, T1-T2 and T2-T3. Skull base and vertebrae: The basion-dental and atlanto-dental intervals are maintained.No evidence of acute fracture to the cervical spine. Soft tissues and spinal canal: No prevertebral fluid or swelling. No visible canal hematoma. Disc levels: Cervical spondylosis with multilevel disc space narrowing, disc bulges/central disc protrusions, uncovertebral hypertrophy and facet arthropathy. Disc space narrowing is greatest at C3-C4 (advanced at this level). C3-C4 posterior disc osteophyte complex resulting in apparent mild-to-moderate spinal canal stenosis. Also at C3-C4, uncovertebral hypertrophy contributes to bilateral bony neural foraminal narrowing. Degenerative changes also present at the C1-C2 articulation. Upper chest: No consolidation within the imaged lung apices. No visible pneumothorax. IMPRESSION: CT head: 1. Please see same day brain MRI for a description of the abnormality affecting the callosal splenium and adjacent parietal and occipital lobes. Differential considerations include a high-grade primary CNS neoplasm (such as glioblastoma multiforme) or lymphoma. Tumefactive demyelination is considered less likely. 2. Unchanged 11 mm calcified  meningioma along the right aspect of the mid-to-posterior falx. 3. Redemonstrated chronic cortically-based left MCA territory infarct at the posterior temporal lobe, temporoparietal junction and occipital lobe. 4.  Redemonstrated chronic infarcts within the left thalamus and bilateral cerebellar hemispheres. 5. Background advanced cerebral white matter disease. CT cervical spine: 1. No evidence of an acute cervical spine fracture. 2. Nonspecific straightening of the expected cervical lordosis. 3. Mild multilevel grade 1 spondylolisthesis. 4. Cervical spondylosis as described. Electronically Signed   By: Rockey Childs D.O.   On: 10/19/2023 16:14   [name] (neuro protocol) Result Date: 10/19/2023 CLINICAL DATA:  Provided history: Neuro deficit, acute, stroke suspected. Additional history provided: Altered mental status, right-sided weakness, slurred speech. EXAM: MRI HEAD WITHOUT CONTRAST TECHNIQUE: Multiplanar, multiecho pulse sequences of the brain and surrounding structures were obtained without intravenous contrast. COMPARISON:  Same day head CT 10/19/2023.  Brain MRI 05/17/2023. FINDINGS: Intermittently motion degraded examination (with up to moderate motion degradation of the acquired sequences). Within this limitation, findings are as follows. Brain: Generalized cerebral and cerebellar atrophy. New from the prior brain MRI of 05/17/2023, there is masslike enlargement of the callosal splenium extending to the medial parietal lobes. Associated ill-defined diffusion-weighted signal abnormality at this site. Prominent surrounding T2 FLAIR hyperintense signal abnormality within the bilateral parietooccipital white matter. Unchanged 1 cm partially calcified mass overlying the high right frontal lobe (adjacent to the superior sagittal sinus) consistent with a meningioma. No significant mass effect upon the underlying brain parenchyma. No underlying parenchymal edema. Redemonstrated chronic  cortically-based left MCA territory infarct at the posterior temporal lobe, temporoparietal junction and occipital lobe. Background advanced cerebral white matter disease. Unchanged small chronic infarcts within the bilateral cerebellar hemispheres. No extra-axial fluid collection. No midline shift. Vascular: Maintained flow voids within the proximal large arterial vessels. Skull and upper cervical spine: No focal worrisome marrow lesion. Sinuses/Orbits: No mass or acute finding within the imaged orbits. No significant paranasal sinus disease. IMPRESSION: 1. Intermittently motion degraded examination. 2. New from the prior brain MRI of 05/17/2023, there is masslike enlargement of the callosal splenium extending to the medial parietal lobes. Associated ill-defined diffusion-weighted signal abnormality at this site. Prominent surrounding T2 FLAIR hyperintense signal abnormality within the bilateral parietooccipital white matter. Differential considerations include a high-grade primary CNS neoplasm (such as glioblastoma multiforme) and lymphoma. Tumefactive demyelination is considered less likely. Post-contrast MR imaging of the brain is recommended for further evaluation. 3. Unchanged 1 cm meningioma overlying the high right frontal lobe. 4. Redemonstrated chronic cortically-based left MCA territory infarct at the posterior temporal lobe, temporoparietal junction and occipital lobe. 5. Background advanced cerebral white matter disease. 6. Unchanged small chronic infarcts within the bilateral cerebellar hemispheres. 7. Generalized parenchymal atrophy. Electronically Signed   By: Rockey Childs D.O.   On: 10/19/2023 15:53   DG Chest 1 View Result Date: 10/19/2023 EXAM: 1 VIEW XRAY OF THE CHEST 10/19/2023 02:49:00 PM COMPARISON: Chest x ray 09/06/2019. CLINICAL HISTORY: ams. Altered mental status. FINDINGS: LUNGS AND PLEURA: No focal pulmonary opacity. No pulmonary edema. No pleural effusion. No pneumothorax. HEART AND  MEDIASTINUM: No acute abnormality of the cardiac and mediastinal silhouettes. Calcification of the aortic arch. BONES AND SOFT TISSUES: Shoulder arthroplasty. IMPRESSION: 1. No acute cardiopulmonary findings. Electronically signed by: Norleen Boxer MD 10/19/2023 03:31 PM EDT RP Workstation: [id number]    Scheduled Meds:  apixaban   5 mg Oral BID   atorvastatin   10 mg Oral Daily   carbidopa-levodopa  1 tablet Oral TID   clopidogrel   75 mg Oral Daily   DULoxetine   60 mg Oral Daily   fluticasone  furoate-vilanterol  1 puff Inhalation Daily   insulin  aspart  0-9  Units Subcutaneous TID WC   levETIRAcetam   500 mg Intravenous BID   levothyroxine   125 mcg Oral Daily   melatonin  10 mg Oral QHS   mirtazapine   15 mg Oral QHS   pantoprazole   40 mg Oral Daily   risperiDONE  0.5 mg Oral BID   Continuous Infusions:  lactated ringers  50 mL/hr at 10/19/23 1845     LOS: 0 days  MDM: Patient is high risk for one or more organ failure.  They necessitate ongoing hospitalization for continued IV therapies and subsequent lab monitoring. Total time spent interpreting labs and vitals, reviewing the medical record, coordinating care amongst consultants and care team members, directly assessing and discussing care with the patient and/or family: 55 min Laree Lock, MD [facility]  To contact the attending physician between 7A-7P please use Epic Chat. To contact the covering physician during after hours 7P-7A, please review Amion.  10/20/2023, 7:49 AM   *This document has been created with the assistance of dictation software. Please excuse typographical errors. *

## 2023-10-20 NOTE — Evaluation (Signed)
 Clinical/Bedside Swallow Evaluation Patient Details  Name: Samantha Aguirre MRN: [id number] Date of Birth: 1945/07/10  Today's Date: 10/20/2023 Time: SLP Start Time (ACUTE ONLY): 0840 SLP Stop Time (ACUTE ONLY): 0940 SLP Time Calculation (min) (ACUTE ONLY): 60 min  Past Medical History:  Past Medical History:  Diagnosis Date   Acid reflux    Arthritis    Bilateral knees, hands, feet   Asthma    Atrial fibrillation (HCC)    Cancer (HCC)    CHF (congestive heart failure) (HCC)    Diastolic CHF   COPD (chronic obstructive pulmonary disease) (HCC)    Coronary artery disease    Diabetes mellitus without complication (HCC)    Frequent headaches    GI bleed    HLD (hyperlipidemia)    Hypertension    Seizures (HCC)    Skin cancer 2015   Suspected basal cell carcinoma on nose, surgically removed   Stroke (HCC) 04/2017   Thyroid  disease    Tremors of nervous system    Past Surgical History:  Past Surgical History:  Procedure Laterality Date   ABDOMINAL HYSTERECTOMY  1976   APPENDECTOMY  1980   BACK SURGERY     cardiac stents     CAROTID ENDARTERECTOMY     CHOLECYSTECTOMY  1980   COLONOSCOPY N/A 02/20/2017   Procedure: COLONOSCOPY;  Surgeon: Unk Corinn Skiff, MD;  Location: [facility] ENDOSCOPY;  Service: Gastroenterology;  Laterality: N/A;   ESOPHAGOGASTRODUODENOSCOPY N/A 02/20/2017   Procedure: ESOPHAGOGASTRODUODENOSCOPY (EGD);  Surgeon: Unk Corinn Skiff, MD;  Location: [facility] ENDOSCOPY;  Service: Gastroenterology;  Laterality: N/A;   ESOPHAGOGASTRODUODENOSCOPY (EGD) WITH PROPOFOL  N/A 07/08/2017   Procedure: ESOPHAGOGASTRODUODENOSCOPY (EGD) WITH PROPOFOL ;  Surgeon: Jinny Carmine, MD;  Location: [facility] ENDOSCOPY;  Service: Endoscopy;  Laterality: N/A;   GALLBLADDER SURGERY  1980   GIVENS CAPSULE STUDY N/A 07/09/2017   Procedure: GIVENS CAPSULE STUDY;  Surgeon: Therisa Bi, MD;  Location: [facility] ENDOSCOPY;  Service: Gastroenterology;  Laterality: N/A;   KNEE SURGERY     left   KYPHOPLASTY  N/A 03/16/2018   Procedure: KYPHOPLASTY, L5;  Surgeon: Kathlynn Sharper, MD;  Location: [facility] ORS;  Service: Orthopedics;  Laterality: N/A;   REVERSE SHOULDER ARTHROPLASTY Right 08/25/2019   Procedure: REVERSE SHOULDER ARTHROPLASTY;  Surgeon: Edie Norleen PARAS, MD;  Location: [facility] ORS;  Service: Orthopedics;  Laterality: Right;   ROTATOR CUFF REPAIR     right   TONSILLECTOMY  1950   HPI:  Pt is a 78 yo female w/ medical h/o Dementia who lives in a facility.  She was found to have R sided weakness and slurred speech and was brought to the emergency department for evaluation.  She had been having worsening behavioral disturbances and was seen in the outpatient setting by her Neurologist just last week.  PMH includes: parkinsonism, vascular dementia with behavioral disturbances, prior strokes, COPD, cancer, CHF, CAD, stroke, seizure, acid Reflux.    CXR: no acute findings.   MRI: New from the prior brain MRI of 05/17/2023, there is masslike  enlargement of the callosal splenium extending to the medial  parietal lobes. Associated ill-defined diffusion-weighted signal  abnormality at this site. Prominent surrounding T2 FLAIR  hyperintense signal abnormality within the bilateral  parietooccipital white matter. Differential considerations include a  high-grade primary CNS neoplasm (such as glioblastoma multiforme)  and lymphoma. Tumefactive demyelination is considered less likely.  Post-contrast MR imaging of the brain is recommended for further  evaluation.  3. Unchanged 1 cm meningioma overlying the high right frontal lobe.  4.  Redemonstrated chronic cortically-based left MCA territory  infarct at the posterior temporal lobe, temporoparietal junction and  occipital lobe.  5. Background advanced cerebral white matter disease.    Assessment / Plan / Recommendation  Clinical Impression   Pt seen for BSE this morning. Pt awake w/ eyes open; few minimal responses (mostly y/n) to basic questions re: self. She did not  consistently answer the questions nor follow 1-step commands. She was oriented to and stated her name (only). Pt has Baseline Dementia. Noted pt's gaze was usually upward/lateral. NSG aware. NSG reported pt passed the NSG swallow screen last evening and that pt had swallowed Pills whole w/ Water (per evening shift report).  On RA, afebrile currently. WBC WNL. Pt nodded to offer of something to drink. She helped to hold the oral swab to attempt min oral care prior but was not fully successful so assistance given.   Pt appears to present w/ oral phase dysphagia w/ functional pharyngeal phase swallowing. No overt  pharyngeal phase dysphagia noted; no overt clinical s/s of aspiration noted. Present were neuromuscular deficits that appeared to impact the oral phase c/b oral tremors and repetitive lingual movements impeding timely oral prep(stage).  Pt consumed po trials and Pills Crushed in Puree w/ NSG/SLP w/ No overt, clinical s/s of aspiration during the po trials. Though oral phase deficits can impact the pharyngeal phase of swallowing, pt appears at reduced risk for aspiration when following general aspiration precautions and using a modified diet consistency of minced foods. Pt has challenging factors that could impact her oropharyngeal swallowing to include Baseline Dementia/cognitive decline, deconditioning/weakness, dependency on being fed currently, and acute illness/hospitalization -- noted changes on pt's MRI w/ Neurosurgery involved. These factors can increase risk for dysphagia as well as decreased oral intake overall.   During po trials, pt consumed all consistencies w/ no overt coughing, decline in vocal quality, or change in respiratory presentation during/post trials. O2 sats upper 90s when checked. Oral phase deficits revealed increased oral prep time w/ decreased awareness, increased lingual movements organizing/readying for taking the next bolus, and min lengthier time for complete clearing and  readiness for next bolus. Oral clearing was appropriate b/t boluses given time -- moistened, soft foods given. OM Exam did not reveal overt unilateral weakness. Speech intelligible w/ the few responses. Pt required full feeding support but was able to help hold Cup.   Recommend a Minced consistency diet(dysphagia level 2) w/ well-moistened foods; Thin liquids -- carefully monitor straw use, and pt should help to Hold Cup when drinking. Recommend general aspiration precautions, small bites/sips slowly to allow time for clearing b/t. Reduce distractions at meals. Feeding support at meals. Pills CRUSHED in Puree for safer, easier swallowing; it was encouraged now and for D/C.   Education given on Pills in Puree; food consistencies/diet and options; general aspiration precautions and feeding support to pt and NSG. ST services will f/u w/ toleration of diet; Education needs. NSG updated, agreed. MD updated. Recommend Dietician f/u for support. Recommend Palliative Care f/u for GOC. Precautions posted in room, chart.  SLP Visit Diagnosis: Dysphagia, oral phase (R13.11) (baseline Dementia; min increased oral movements; dependency on being fed; Edentulous)    Aspiration Risk  Mild aspiration risk;Risk for inadequate nutrition/hydration (reduced w/ general aspiration precautions and support at meals)    Diet Recommendation   Thin;Dysphagia 2 (chopped) (gravies to moisten) = a Minced consistency diet(dysphagia level 2) w/ well-moistened foods; Thin liquids -- carefully monitor straw use, and pt should help to  Hold Cup when drinking. Recommend general aspiration precautions, small bites/sips slowly to allow time for clearing b/t. Reduce distractions at meals. Feeding support at meals.   Medication Administration: Crushed with puree    Other  Recommendations Recommended Consults:  (Dietician f/u) Oral Care Recommendations: Oral care BID;Staff/trained caregiver to provide oral care     Assistance Recommended at  Discharge  FULL at meals  Functional Status Assessment Patient has had a recent decline in their functional status and/or demonstrates limited ability to make significant improvements in function in a reasonable and predictable amount of time  Frequency and Duration min 1 x/week  1 week       Prognosis Prognosis for improved oropharyngeal function: Fair Barriers to Reach Goals: Cognitive deficits;Language deficits;Time post onset;Severity of deficits Barriers/Prognosis Comment: Baseline Dementia; Edentulous status; dependency on being fed  currently      Swallow Study   General Date of Onset: 10/19/23 HPI: Pt is a 78 yo female w/ medical h/o Dementia who lives in a facility.  She was found to have R sided weakness and slurred speech and was brought to the emergency department for evaluation.  She had been having worsening behavioral disturbances and was seen in the outpatient setting by her Neurologist just last week.  PMH includes: parkinsonism, vascular dementia with behavioral disturbances, prior strokes, COPD, cancer, CHF, CAD, stroke, seizure, acid Reflux.    CXR: no acute findings.   MRI: New from the prior brain MRI of 05/17/2023, there is masslike  enlargement of the callosal splenium extending to the medial  parietal lobes. Associated ill-defined diffusion-weighted signal  abnormality at this site. Prominent surrounding T2 FLAIR  hyperintense signal abnormality within the bilateral  parietooccipital white matter. Differential considerations include a  high-grade primary CNS neoplasm (such as glioblastoma multiforme)  and lymphoma. Tumefactive demyelination is considered less likely.  Post-contrast MR imaging of the brain is recommended for further  evaluation.  3. Unchanged 1 cm meningioma overlying the high right frontal lobe.  4. Redemonstrated chronic cortically-based left MCA territory  infarct at the posterior temporal lobe, temporoparietal junction and  occipital lobe.  5. Background  advanced cerebral white matter disease. Type of Study: Bedside Swallow Evaluation Previous Swallow Assessment: none; SLE in 2024: Pt presents with s/sx significant wordfinding difficulty during spontaneous speech, confrontation naming, and picture description tasks. Pt with mildly impaired  auditory comprehension with yes/no questoins and 2-step commands. Pt with good awareness of errors, but inconsistent ability to repair communication breakdowns. Intact repetition noted.; also premorbid communication issues prior per chart. Diet Prior to this Study: NPO Temperature Spikes Noted: No (wbc 8.2) Respiratory Status: Room air History of Recent Intubation: No Behavior/Cognition: Alert;Cooperative;Pleasant mood;Confused;Distractible;Requires cueing (hand over hand) Oral Cavity Assessment: Dry Oral Care Completed by SLP: Yes (attempted w/ pt's allowance) Oral Cavity - Dentition: Edentulous Vision:  (unknown) Self-Feeding Abilities: Needs assist;Needs set up;Total assist (could help hold cup) Patient Positioning: Upright in bed (full assist) Baseline Vocal Quality: Low vocal intensity (slightly mumbled/muttered speech) Volitional Cough: Cognitively unable to elicit Volitional Swallow: Unable to elicit    Oral/Motor/Sensory Function Overall Oral Motor/Sensory Function: Within functional limits (no gross, overt unilateral weakness. Oral tremors noted.)   Ice Chips Ice chips: Impaired (min) Presentation: Spoon (fed; 3 trials) Oral Phase Impairments: Poor awareness of bolus (min; increased oral movements) Oral Phase Functional Implications: Prolonged oral transit Pharyngeal Phase Impairments:  (none)   Thin Liquid Thin Liquid: Impaired (min) Presentation: Straw;Cup;Self Fed (fully supported; ~6 ozs total) Oral Phase Impairments: Poor  awareness of bolus (min; increased oral movements) Oral Phase Functional Implications: Prolonged oral transit Pharyngeal  Phase Impairments:  (none) Other Comments:  water, juice, ensure    Nectar Thick Nectar Thick Liquid: Not tested   Honey Thick Honey Thick Liquid: Not tested   Puree Puree: Impaired (min) Presentation: Spoon (fed; 12 trials) Oral Phase Impairments: Poor awareness of bolus;Impaired mastication (min increased oral movements) Oral Phase Functional Implications: Prolonged oral transit Pharyngeal Phase Impairments:  (none)   Solid     Solid: Impaired Presentation: Spoon (fed; 3 trials) Oral Phase Impairments: Impaired mastication;Poor awareness of bolus (edentulous; increased oral movements) Oral Phase Functional Implications: Impaired mastication;Prolonged oral transit Pharyngeal Phase Impairments:  (none)        Comer Portugal, MS, CCC-SLP Speech Language Pathologist Rehab Services; [facility] [place] [phone] (ascom) [place] 10/20/2023,1:39 PM

## 2023-10-21 DIAGNOSIS — G9389 Other specified disorders of brain: Secondary | ICD-10-CM | POA: Diagnosis not present

## 2023-10-21 DIAGNOSIS — R4182 Altered mental status, unspecified: Secondary | ICD-10-CM | POA: Diagnosis not present

## 2023-10-21 LAB — BASIC METABOLIC PANEL WITH GFR
Anion gap: 9 (ref 5–15)
BUN: 13 mg/dL (ref 8–23)
CO2: 25 mmol/L (ref 22–32)
Calcium: 8.3 mg/dL — ABNORMAL LOW (ref 8.9–10.3)
Chloride: 103 mmol/L (ref 98–111)
Creatinine, Ser: 0.57 mg/dL (ref 0.44–1.00)
GFR, Estimated: >60 mL/min (ref >60)
Glucose, Bld: 147 mg/dL — ABNORMAL HIGH (ref 70–99)
Potassium: 3.2 mmol/L — ABNORMAL LOW (ref 3.5–5.1)
Sodium: 137 mmol/L (ref 135–145)

## 2023-10-21 LAB — BLOOD CULTURE ID PANEL (REFLEXED) - BCID2

## 2023-10-21 LAB — CBC
HCT: 31 % — ABNORMAL LOW (ref 36.0–46.0)
Hemoglobin: 9.8 g/dL — ABNORMAL LOW (ref 12.0–15.0)
MCH: 28.4 pg (ref 26.0–34.0)
MCHC: 31.6 g/dL (ref 30.0–36.0)
MCV: 89.9 fL (ref 80.0–100.0)
Platelets: 168 K/uL (ref 150–400)
RBC: 3.45 MIL/uL — ABNORMAL LOW (ref 3.87–5.11)
RDW: 13.9 % (ref 11.5–15.5)
WBC: 6.3 K/uL (ref 4.0–10.5)
nRBC: 0 % (ref 0.0–0.2)

## 2023-10-21 LAB — GLUCOSE, CAPILLARY
Glucose-Capillary: 141 mg/dL — ABNORMAL HIGH (ref 70–99)
Glucose-Capillary: 142 mg/dL — ABNORMAL HIGH (ref 70–99)
Glucose-Capillary: 134 mg/dL — ABNORMAL HIGH (ref 70–99)

## 2023-10-21 MED ORDER — SODIUM CHLORIDE 0.9 % IV SOLN
1.0000 g | Freq: Three times a day (TID) | INTRAVENOUS | Status: AC
  Administered 2023-10-21 – 2023-10-22 (×5): 1 g via INTRAVENOUS
  Filled 2023-10-21 (×6): qty 20

## 2023-10-21 MED ORDER — OLANZAPINE 10 MG IM SOLR
5.0000 mg | Freq: Once | INTRAMUSCULAR | Status: AC
  Administered 2023-10-21: 5 mg via INTRAMUSCULAR
  Filled 2023-10-21: qty 10

## 2023-10-21 MED ORDER — OLANZAPINE 10 MG IM SOLR
10.0000 mg | Freq: Once | INTRAMUSCULAR | Status: AC
  Administered 2023-10-21: 10 mg via INTRAMUSCULAR
  Filled 2023-10-21: qty 10

## 2023-10-21 MED ORDER — OLANZAPINE 10 MG IM SOLR
5.0000 mg | Freq: Once | INTRAMUSCULAR | Status: DC
  Filled 2023-10-21: qty 10

## 2023-10-21 MED ORDER — LACTATED RINGERS IV BOLUS
250.0000 mL | Freq: Once | INTRAVENOUS | Status: AC
  Administered 2023-10-21: 250 mL via INTRAVENOUS

## 2023-10-21 MED ORDER — HALOPERIDOL LACTATE 5 MG/ML IJ SOLN
2.0000 mg | Freq: Four times a day (QID) | INTRAMUSCULAR | Status: DC | PRN
  Administered 2023-10-22 – 2023-10-25 (×3): 2 mg via INTRAVENOUS
  Filled 2023-10-21 (×3): qty 1

## 2023-10-21 MED ORDER — HALOPERIDOL LACTATE 5 MG/ML IJ SOLN
5.0000 mg | Freq: Once | INTRAMUSCULAR | Status: DC
  Filled 2023-10-21: qty 1

## 2023-10-21 MED ORDER — SODIUM CHLORIDE 0.9 % IV BOLUS: 250.0000 mL | Freq: Once | INTRAVENOUS | Status: DC

## 2023-10-21 NOTE — Progress Notes (Signed)
 PT Cancellation Note  Patient Details Name: Tracyann Duffell MRN: [id number] DOB: [birth year]   Cancelled Treatment:    Reason Eval/Treat Not Completed: Other (comment).  Chart reviewed and attempted to see pt.  Pt ultimately refused therapy and cursed at therapist on multiple occasions.  Pt denied any mobility at this time.  Pt has received hospice consult and eval orders discontinued following attempt by therapist.     Fonda Simpers, PT, DPT Physical Therapist - [facility]  10/21/23, 11:52 AM

## 2023-10-21 NOTE — TOC Initial Note (Addendum)
 Transition of Care [facility]) - Initial/Assessment Note    Patient Details  Name: Samantha Aguirre MRN: [id number] Date of Birth: [birth year]  Transition of [facility]) CM/SW Contact:    Samantha Aguirre Fuse, RN Phone Number: 10/21/2023, 11:13 AM  Clinical Narrative:                  The patient is from [facility]. TOC spoke with the patient's daughter, Samantha Aguirre, she is interested in [employer]. Her preference is for the patient to return to [facility] with Hospice like other residents have done in the past. Samantha Aguirre spoke with Samantha Aguirre at [place], and she doesn't feel they can support a hospice patient at this point do to staffing. TOC placed referral to [facility], Samantha Aguirre excepted. TOC placed call to [facility] and the secretary is going to speak with Samantha Aguirre and have her call TOC. TOC will continue to follow.  TOC received a message from [facility] rep. [facility] is unable to offer IPU bed at this time. [facility] told the daughter they don't have the staff to care for the patient.  TOC sent FL2 out in [place], [facility] has offered a bed. TOC spoke with the patient's daughter and she would like to accept the bed. The patient has Medicare, and Medicaid and also receives additional money from SSI that sounds like LTC financial assistance.  TOC placed call to [facility] to accept the bed. Samantha Aguirre will call the daughter to discuss financials.  TOC received a callback from Samantha Aguirre advising he and the patient's daughter will work on Education officer, community, they can accept the patient. The patient is + ESBL. Samantha Aguirre needs to speak with the infection control nurse at the facility to see if they can admit her tomorrow. If not tomorrow, it will be Friday.   TOC will continue to follow.  Expected Discharge Plan: Hospice Medical Facility Barriers to Discharge: Other (must enter comment) (Awaiting Hospice IPU Eval)   Patient Goals and CMS Choice     Choice offered to / list presented to : Adult Children       Expected Discharge Plan and Services   Discharge Planning Services: CM Consult Post Acute Care Choice: Hospice Living arrangements for the past 2 months: Assisted Living Facility                                      Prior Living Arrangements/Services Living arrangements for the past 2 months: Assisted Living Facility Lives with:: Facility Resident                   Activities of Daily Living   ADL Screening (condition at time of admission) Independently performs ADLs?: No Does the patient have a NEW difficulty with bathing/dressing/toileting/self-feeding that is expected to last >3 days?: Yes (Initiates electronic notice to provider for possible OT consult) Does the patient have a NEW difficulty with getting in/out of bed, walking, or climbing stairs that is expected to last >3 days?: Yes (Initiates electronic notice to provider for possible PT consult) Does the patient have a NEW difficulty with communication that is expected to last >3 days?: Yes (Initiates electronic notice to provider for possible SLP consult) Is the patient deaf or have difficulty hearing?: No Does the patient have difficulty seeing, even when wearing glasses/contacts?: No Does the patient have difficulty concentrating, remembering, or making decisions?: Yes  Permission Sought/Granted  Emotional Assessment              Admission diagnosis:  Slurred speech [R47.81] Hypoxia [R09.02] Weakness of right lower extremity [R29.898] Altered mental status, unspecified altered mental status type [R41.82] AMS (altered mental status) [R41.82] Encephalopathy [G93.40] Patient Active Problem List   Diagnosis Date Noted   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) 10/20/2023   Encephalopathy 10/20/2023   Brain mass 10/19/2023   Stroke-like symptoms 10/19/2023   Parkinsonism (HCC) 10/19/2023   Seizure-like activity (HCC) 10/19/2023   Neuropathy 10/22/2022    Tobacco abuse counseling 05/09/2022   Fall at nursing home    Vascular dementia without behavioral disturbance (HCC)    Bacteremia    Sepsis with encephalopathy without septic shock (HCC)    AMS (altered mental status) 09/06/2019   Fever 09/06/2019   SIRS (systemic inflammatory response syndrome) (HCC) 09/06/2019   Hypokalemia 09/06/2019   Status post reverse total shoulder replacement, 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   Stroke (HCC) 06/13/2018   Hypotension 03/18/2018   Malnutrition of moderate degree 03/16/2018   Closed fracture of lumbar vertebral body (HCC) 03/15/2018   Speaking difficulty 03/13/2018   TIA (transient ischemic attack) 09/22/2017   PAF (paroxysmal atrial fibrillation) (HCC)    Acute CVA (cerebrovascular accident) (HCC) 07/30/2017   GI bleed 07/08/2017   Anemia 07/08/2017   Angiodysplasia of stomach and duodenum with hemorrhage    CVA (cerebral vascular accident) (HCC) 07/06/2017   Stroke (cerebrum) (HCC)    Goals of care, counseling/discussion    Palliative care encounter    COPD exacerbation (HCC) 04/03/2017   Flu 03/27/2017   Iron  deficiency anemia due to chronic blood loss    Cataracts, bilateral 01/15/2017   Chronic venous insufficiency 10/06/2016   Recurrent major depressive disorder, in partial remission 10/06/2016   Left-sided weakness 10/03/2016   Chest pain 02/27/2016   History of CVA (cerebrovascular accident) 02/19/2016   Thumb pain, left 01/22/2016   Traumatic closed nondisplaced fracture of base of metacarpal bone of left thumb 01/22/2016   Colon cancer screening 11/27/2015   Depression 11/14/2015   Coronary artery disease 11/13/2015   Hypertension 11/13/2015   History of skin cancer 11/13/2015   Osteoporosis 11/13/2015   Chronic low back pain 11/13/2015   Left medial knee pain 11/13/2015   Osteoarthritis of multiple joints 11/13/2015   Other specified hypothyroidism 08/16/2015   Type 2 diabetes mellitus with other specified complication (HCC) 08/16/2015    COPD (chronic obstructive pulmonary disease) (HCC) 08/16/2015   Tobacco abuse 08/16/2015   HLD (hyperlipidemia) 08/16/2015   History of CHF (congestive heart failure) 08/16/2015   Seizure (HCC)    PCP:  Brutus Delon SAUNDERS, NP Pharmacy:   [facility] [place] - [address] [place] [address] Phone: [phone] Fax: [phone]  [facility] - [place],  - [address] [place] [address] Phone: [phone] Fax: [phone]     Social Drivers of Health (SDOH) Social History: SDOH Screenings   Food Insecurity: No Food Insecurity (10/19/2023)  Housing: Low Risk  (10/19/2023)  Transportation Needs: No Transportation Needs (10/19/2023)  Utilities: Not At Risk (10/19/2023)  Depression (PHQ2-9): Medium Risk (04/21/2018)  Financial Resource Strain: Low Risk  (08/18/2023)   Received from [facility]  Physical Activity: Insufficiently Active (05/19/2017)  Social Connections: Patient Unable To Answer (10/19/2023)  Stress: Stress Concern Present (05/19/2017)  Tobacco Use: High Risk (10/19/2023)   SDOH Interventions:  Readmission Risk Interventions     No data to display

## 2023-10-21 NOTE — Progress Notes (Addendum)
 PIV consult: R lateral forearm attempt unsuccessful. Pt pulling away during IV start. Pt will not allow assessment with US . (Not violent with this RN, just pulling away and stated NO, firmly several times.) Discussed with Maurilio, RN at bedside. She will re-enter consult if pt is agreeable later.

## 2023-10-21 NOTE — Progress Notes (Signed)
 SLP Cancellation Note  Patient Details Name: Samantha Aguirre MRN: [id number] DOB: [birth year]   Cancelled treatment:       Reason Eval/Treat Not Completed: Medical issues which prohibited therapy;Patient's level of consciousness (Pt's behaviors; agitation.)   Per NSG, chart this morning, pt is more alert and speaking but also agitated and refusing intervention and support by Staff. Noted Security in the room; NSG reported pt hitting at Staff and sedating medication had to be given.  Pt is not appropriate for ST services in current State. ST services will await MD order for re-addressing pt's swallowing and f/u then. Per chart notes/TOC, the plan is for pt to D/C to facility w/ Hospice Care.  Recommend oral care for hygiene and stimulation of swallowing as pt allows. NSG agreed.      Comer Portugal, MS, CCC-SLP Speech Language Pathologist Rehab Services; [facility] [phone] (ascom) [place] 10/21/2023, 3:11 PM

## 2023-10-21 NOTE — Plan of Care (Signed)
 Patient very agitated and aggressive today. Patient swinging hands and punching. MD aware. Ativan  given. Effective for a short time. Zyprexa given and effective for patient. Still agitated but less aggressive after interventions. Tele sitter in room.   Problem: Education: Goal: Ability to describe self-care measures that may prevent or decrease complications (Diabetes Survival Skills Education) will improve Outcome: Not Progressing Goal: Individualized Educational Video(s) Outcome: Not Progressing   Problem: Coping: Goal: Ability to adjust to condition or change in health will improve Outcome: Not Progressing   Problem: Fluid Volume: Goal: Ability to maintain a balanced intake and output will improve Outcome: Not Progressing   Problem: Health Behavior/Discharge Planning: Goal: Ability to identify and utilize available resources and services will improve Outcome: Not Progressing Goal: Ability to manage health-related needs will improve Outcome: Not Progressing   Problem: Metabolic: Goal: Ability to maintain appropriate glucose levels will improve Outcome: Not Progressing   Problem: Nutritional: Goal: Maintenance of adequate nutrition will improve Outcome: Not Progressing Goal: Progress toward achieving an optimal weight will improve Outcome: Not Progressing   Problem: Skin Integrity: Goal: Risk for impaired skin integrity will decrease Outcome: Not Progressing   Problem: Tissue Perfusion: Goal: Adequacy of tissue perfusion will improve Outcome: Not Progressing   Problem: Education: Goal: Knowledge of General Education information will improve Description: Including pain rating scale, medication(s)/side effects and non-pharmacologic comfort measures Outcome: Not Progressing   Problem: Health Behavior/Discharge Planning: Goal: Ability to manage health-related needs will improve Outcome: Not Progressing   Problem: Clinical Measurements: Goal: Ability to maintain clinical  measurements within normal limits will improve Outcome: Not Progressing Goal: Will remain free from infection Outcome: Not Progressing Goal: Diagnostic test results will improve Outcome: Not Progressing Goal: Respiratory complications will improve Outcome: Not Progressing Goal: Cardiovascular complication will be avoided Outcome: Not Progressing   Problem: Activity: Goal: Risk for activity intolerance will decrease Outcome: Not Progressing   Problem: Nutrition: Goal: Adequate nutrition will be maintained Outcome: Not Progressing   Problem: Coping: Goal: Level of anxiety will decrease Outcome: Not Progressing   Problem: Elimination: Goal: Will not experience complications related to bowel motility Outcome: Not Progressing Goal: Will not experience complications related to urinary retention Outcome: Not Progressing   Problem: Pain Managment: Goal: General experience of comfort will improve and/or be controlled Outcome: Not Progressing   Problem: Safety: Goal: Ability to remain free from injury will improve Outcome: Not Progressing   Problem: Skin Integrity: Goal: Risk for impaired skin integrity will decrease Outcome: Not Progressing

## 2023-10-21 NOTE — Progress Notes (Signed)
 Pharmacy Antibiotic Note  Samantha Aguirre is a 78 y.o. female admitted on 10/19/2023 with fever of unknown source.  Pharmacy has been consulted for meropenem dosing.  Plan: Meropenem 1 gm IV Q8H ordered to start on 10/8 @ 0200.   Height: 5' 1 (154.9 cm) Weight: 77.6 kg (171 lb 1.2 oz) IBW/kg (Calculated) : 47.8  Temp (24hrs), Avg:99.1 F (37.3 C), Min:97.5 F (36.4 C), Max:100.3 F (37.9 C)  Recent Labs  Lab 10/19/23 1304  WBC 8.2  CREATININE 0.56    Estimated Creatinine Clearance: 53.7 mL/min (by C-G formula based on SCr of 0.56 mg/dL).    Allergies  Allergen Reactions   Ivp Dye [Iodinated Contrast Media] Itching    Pt injected with IV contrast.  5 min after injection pt complained of itching behind ear and on her abd.   Methotrexate Rash   Morphine  And Codeine Other (See Comments)    stops my breathing NEAR-RESPIRATORY ARREST   Mushroom Extract Complex (Obsolete) Anaphylaxis    Made patient deathly sick   Atenolol Other (See Comments)    MD took me off of it bc it was doing something wrong Made patient HYPOTENSIVE   Percocet Colton.Comment ] Nausea And Vomiting and Other (See Comments)    Stomach pains   Percodan [Oxycodone-Aspirin ] Nausea And Vomiting and Other (See Comments)    Stomach pains   Tramadol  Nausea Only   Verapamil Other (See Comments)     MD told me this was screwing me up MAKES PATIENT HYPOTENSIVE   Aspirin     Codeine    Oxycodone Hcl     Antimicrobials this admission:   >>    >>   Dose adjustments this admission:   Microbiology results:  BCx:   UCx:    Sputum:    MRSA PCR:   Thank you for allowing pharmacy to be a part of this patient's care.  Samantha Aguirre 10/21/2023 2:02 AM

## 2023-10-21 NOTE — NC FL2 (Signed)
 [place]  MEDICAID [id number] LEVEL OF CARE FORM     IDENTIFICATION  Patient Name: Samantha Aguirre Birthdate: 1945-06-10 Sex: female Admission Date (Current Location): 10/19/2023  [facility] [place] Number:  Chiropodist and Address:  [facility], [address], [place], [place] [address]      Provider Number: [id number]  Attending Physician Name and Address:  Samantha City, Samantha Aguirre  Relative Name and Phone Number:  Samantha, Aguirre (Daughter)  [phone]    Current Level of Care: Hospital Recommended Level of Care: Skilled Nursing Facility Prior Approval Number:    Date Approved/Denied:   PASRR Number: [id number] Aguirre  Discharge Plan: SNF    Current Diagnoses: Patient Active Problem List   Diagnosis Date Noted   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) 10/20/2023   Encephalopathy 10/20/2023   Brain mass 10/19/2023   Stroke-like symptoms 10/19/2023   Parkinsonism (HCC) 10/19/2023   Seizure-like activity (HCC) 10/19/2023   Neuropathy 10/22/2022   Tobacco abuse counseling 05/09/2022   Fall at nursing home    Vascular dementia without behavioral disturbance (HCC)    Bacteremia    Sepsis with encephalopathy without septic shock (HCC)    AMS (altered mental status) 09/06/2019   Fever 09/06/2019   SIRS (systemic inflammatory response syndrome) (HCC) 09/06/2019   Hypokalemia 09/06/2019   Status post reverse total shoulder replacement, 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   Stroke (HCC) 06/13/2018   Hypotension 03/18/2018   Malnutrition of moderate degree 03/16/2018   Closed fracture of lumbar vertebral body (HCC) 03/15/2018   Speaking difficulty 03/13/2018   TIA (transient ischemic attack) 09/22/2017   PAF (paroxysmal atrial fibrillation) (HCC)    Acute CVA (cerebrovascular accident) (HCC) 07/30/2017   GI bleed 07/08/2017   Anemia 07/08/2017   Angiodysplasia of stomach and duodenum with hemorrhage    CVA (cerebral vascular accident) (HCC) 07/06/2017    Stroke (cerebrum) (HCC)    Goals of care, counseling/discussion    Palliative care encounter    COPD exacerbation (HCC) 04/03/2017   Flu 03/27/2017   Iron  deficiency anemia due to chronic blood loss    Cataracts, bilateral 01/15/2017   Chronic venous insufficiency 10/06/2016   Recurrent major depressive disorder, in partial remission 10/06/2016   Left-sided weakness 10/03/2016   Chest pain 02/27/2016   History of CVA (cerebrovascular accident) 02/19/2016   Thumb pain, left 01/22/2016   Traumatic closed nondisplaced fracture of base of metacarpal bone of left thumb 01/22/2016   Colon cancer screening 11/27/2015   Depression 11/14/2015   Coronary artery disease 11/13/2015   Hypertension 11/13/2015   History of skin cancer 11/13/2015   Osteoporosis 11/13/2015   Chronic low back pain 11/13/2015   Left medial knee pain 11/13/2015   Osteoarthritis of multiple joints 11/13/2015   Other specified hypothyroidism 08/16/2015   Type 2 diabetes mellitus with other specified complication (HCC) 08/16/2015   COPD (chronic obstructive pulmonary disease) (HCC) 08/16/2015   Tobacco abuse 08/16/2015   HLD (hyperlipidemia) 08/16/2015   History of CHF (congestive heart failure) 08/16/2015   Seizure (HCC)     Orientation RESPIRATION BLADDER Height & Weight          Incontinent Weight: 77.6 kg Height:  5' 1 (154.9 cm)  BEHAVIORAL SYMPTOMS/MOOD NEUROLOGICAL BOWEL NUTRITION STATUS      Incontinent Diet (NPO)  AMBULATORY STATUS COMMUNICATION OF NEEDS Skin   Extensive Assist Verbally  Personal Care Assistance Level of Assistance  Feeding, Dressing, Bathing Bathing Assistance: Maximum assistance Feeding assistance: Maximum assistance Dressing Assistance: Maximum assistance     Functional Limitations Info             SPECIAL CARE FACTORS FREQUENCY  PT (By licensed PT), OT (By licensed OT)     PT Frequency: 5 x week OT Frequency: 5 x week             Contractures      Additional Factors Info  Code Status, Allergies Code Status Info: DNR Allergies Info: Ivp Dye (iodinated Contrast Media) High Allergy Itching Pt injected with IV contrast.  5 min after injection pt complained of itching behind ear and on her abd.  Methotrexate High Allergy Rash   Morphine  And Codeine High Intolerance Other (See Comments) stops my breathing NEAR-RESPIRATORY ARREST  Mushroom Extract Complex (obsolete) High Allergy Anaphylaxis Made patient deathly sick  Atenolol Medium Contraindication Other (See Comments) Samantha Aguirre took me off of it bc it was doing something wrong Made patient HYPOTENSIVE  Percocet (oxycodone-acetaminophen ) Medium Intolerance Nausea And Vomiting, Other (See Comments) Stomach pains  Percodan (oxycodone-aspirin ) Medium Intolerance Nausea And Vomiting, Other (See Comments) Stomach pains  Tramadol  Medium Intolerance Nausea Only   Verapamil Medium Contraindication Other (See Comments)  Samantha Aguirre told me this was screwing me up MAKES PATIENT HYPOTENSIVE  Aspirin  Not Specified     Codeine Not Specified     Oxycodone Hcl Not Specified           Current Medications (10/21/2023):  This is the current hospital active medication list Current Facility-Administered Medications  Medication Dose Route Frequency Provider Last Rate Last Admin   acetaminophen  (TYLENOL ) tablet 650 mg  650 mg Oral Q6H PRN Samantha Sor Aguirre, Samantha Aguirre       Or   acetaminophen  (TYLENOL ) suppository 650 mg  650 mg Rectal Q6H PRN Samantha Sor Aguirre, Samantha Aguirre       albuterol  (PROVENTIL ) (2.5 MG/3ML) 0.083% nebulizer solution 2.5 mg  2.5 mg Nebulization Q6H PRN Samantha Sor Aguirre, Samantha Aguirre       apixaban  (ELIQUIS ) tablet 5 mg  5 mg Oral BID Samantha Sor Aguirre, Samantha Aguirre   5 mg at 10/20/23 0900   atorvastatin  (LIPITOR ) tablet 10 mg  10 mg Oral Daily Samantha Sor Aguirre, Samantha Aguirre   10 mg at 10/20/23 0859   carbidopa-levodopa (SINEMET IR) 25-100 MG per tablet immediate release 1 tablet  1 tablet Oral TID Samantha Sor Aguirre, Samantha Aguirre   1  tablet at 10/20/23 0900   clopidogrel  (PLAVIX ) tablet 75 mg  75 mg Oral Daily Samantha Sor Aguirre, Samantha Aguirre   75 mg at 10/20/23 0900   DULoxetine  (CYMBALTA ) DR capsule 60 mg  60 mg Oral Daily Samantha Sor Aguirre, Samantha Aguirre   60 mg at 10/20/23 9140   fluticasone  furoate-vilanterol (BREO ELLIPTA ) 100-25 MCG/ACT 1 puff  1 puff Inhalation Daily Samantha Sor Aguirre, Samantha Aguirre       haloperidol  lactate (HALDOL ) injection 5 mg  5 mg Intravenous Once Samantha City, Samantha Aguirre       insulin  aspart (novoLOG ) injection 0-9 Units  0-9 Units Subcutaneous TID WC Samantha Sor Aguirre, Samantha Aguirre   1 Units at 10/21/23 9185   lactated ringers  infusion   Intravenous Continuous Samantha Aguirre, Shruthi, Samantha Aguirre 50 mL/hr at 10/20/23 1857 New Bag at 10/20/23 1857   levETIRAcetam  (KEPPRA ) undiluted injection 500 mg  500 mg Intravenous BID Samantha Sor Aguirre, Samantha Aguirre   500 mg at 10/21/23 0815   levothyroxine  (SYNTHROID ) tablet 125  mcg  125 mcg Oral Daily Samantha Burnard LABOR, Samantha Aguirre   125 mcg at 10/20/23 9374   LORazepam  (ATIVAN ) injection 0.5 mg  0.5 mg Intravenous Q6H PRN Samantha Burnard Aguirre, Samantha Aguirre   0.5 mg at 10/21/23 1108   melatonin tablet 10 mg  10 mg Oral QHS Samantha Burnard Aguirre, Samantha Aguirre   10 mg at 10/19/23 2202   meropenem (MERREM) 1 g in sodium chloride  0.9 % 100 mL IVPB  1 g Intravenous Q8H Samantha Aguirre, Samantha Aguirre, Samantha Aguirre 200 mL/hr at 10/21/23 0340 1 g at 10/21/23 0340   mirtazapine  (REMERON  SOL-TAB) disintegrating tablet 15 mg  15 mg Oral QHS Samantha Burnard Aguirre, Samantha Aguirre   15 mg at 10/19/23 2204   OLANZapine (ZYPREXA) injection 5 mg  5 mg Intramuscular Once Samantha Aguirre, Tina, Samantha Aguirre       ondansetron  (ZOFRAN ) injection 4 mg  4 mg Intravenous Q6H PRN Samantha Aguirre, Shruthi, Samantha Aguirre   4 mg at 10/20/23 1137   pantoprazole  (PROTONIX ) EC tablet 40 mg  40 mg Oral Daily Samantha Burnard Aguirre, Samantha Aguirre   40 mg at 10/20/23 0859   polyethylene glycol (MIRALAX  / GLYCOLAX ) packet 17 g  17 g Oral Daily PRN Samantha Burnard Aguirre, Samantha Aguirre       risperiDONE (RISPERDAL) tablet 0.5 mg  0.5 mg Oral BID Samantha Burnard Aguirre, Samantha Aguirre   0.5 mg at 10/20/23 0900   sorbitol 70 % solution  30 mL  30 mL Oral Daily PRN Samantha Burnard LABOR, Samantha Aguirre         Discharge Medications: Please see discharge summary for Aguirre list of discharge medications.  Relevant Imaging Results:  Relevant Lab Results:   Additional Information SS#: [id number]  Samantha GORMAN Fuse, Samantha Aguirre

## 2023-10-21 NOTE — Progress Notes (Addendum)
 OT Cancellation Note  Patient Details Name: Samantha Aguirre MRN: [id number] DOB: Jan 21, 1945   Cancelled Treatment:    Reason Eval/Treat Not Completed: Patient declined, no reason specified. Pt received sitting at EOB; adamantly refusing ADL or mobility. Pt responds with 1-word answers and states no with all attempts to provide comfort / self-care or mobility. Pt states nowhere when asked if she wanted to go home. OT will check back as able.   Per secure chat with care team, pt is discharging to hospice at ALF. No further OT needs, OT to complete orders. Thank you for the referral.   Delon L. Tristen Pennino, OTR/L  10/21/23, 10:12 AM

## 2023-10-21 NOTE — Progress Notes (Signed)
 Pt sitting on the side of the bed. Refused to get up/ reposition/ lay down/get in the chair/ bed alarm. Put the table in front of the patient to help her not to fall on the floor. Patient stated no to everything and wants to be left alone.   All fall precautions refused by patient.

## 2023-10-21 NOTE — Progress Notes (Signed)
 PROGRESS NOTE    Samantha Aguirre  FMW:[id number] DOB: Jan 18, 1945 DOA: 10/19/2023 PCP: Brutus Delon SAUNDERS, NP  [id number]  LOS: 1 day   Brief hospital course:   Assessment & Plan: Samantha Aguirre is a 78 y.o. female with medical history significant of parkinsonism, vascular dementia with behavioral disturbances, prior strokes, COPD presented from her assisted living facility due to altered mental status, slurred speech and right leg weakness.  Patient had recent behavioral problems, risperidone dose was increased.  Further workup showed imaging with new masslike enlargement of the callosal splenium, likely CNS neoplasm.    Acute metabolic encephalopathy --multi-etiologies, including large brain mass, infection.  Mental status has been declining prior to presentation. --IV haldol  PRN or IM zyprexa PRN  New brain mass  -concerning for primary CNS neoplasm, consistent with glioblastoma.  - Neurosurgery is unable to offer resection due to its anatomical location.  Per Dr. Clois, pt has 1-2 months left, maybe less. --continue goals of care discussion  ESBL bacteremia --cont meropenem  Acute hypoxia -without apparent respiratory symptoms.   O2 sat noted to drop as low as 72-88% on room air in the ED. chest x-ray without acute findings.  Viral PCR's negative Covid/flu/RSV.   Given concern for new malignancy, PE should be ruled out.  Patient has severe allergy to IV contrast. - Will obtain VQ scan -follow results - Supplement O2 as needed to keep sats above 90% - As needed albuterol  nebs   History of CVA  - Continue Eliquis  and Plavix    Parkinsonism  - Continue Sinemet   History of vascular dementia with behavioral disturbances  - Continue home Risperdal 0.5 mg twice daily - Delirium precautions   History of seizure-like activity On IV Kepra 500mg  bid, if able to take p.o. -Will restart gabapentin  400 TID   COPD -stable without acute exacerbation, no wheezing on exam -  Continue Breo - Nebulized albuterol  as needed   Type 2 diabetes  -hold home glipizide  metformin  --d/c BG checks and SSI   Hyperlipidemia -continue Lipitor  10 mg daily   Hypothyroidism -continue Synthroid    Insomnia -continue home melatonin and Remeron    GERD -continue Protonix    DVT prophylaxis: On:Eliquis  Code Status: DNR  Family Communication: daughter updated on the phone today Level of care: Med-Surg Dispo:   The patient is from: ALF Anticipated d/c is to: to be determined Anticipated d/c date is: to be determined   Subjective and Interval History:  Pt confused and agitated throughout the day.  Pt pulled out IV and refused to be touched and have another one put in.    An IV is necessary currently to treat ESBL bacteremia.  Discussed with daughter the options of sedating pt to establish IV in order to continue IV abx, vs comfort care and forgoing tx since pt only has 1-2 months to live.  For the time being, daughter wants pt to continue tx, and accepted the risk of giving pt medication to sedate her to the level where an IV can be placed.     Objective: Vitals:   10/21/23 0746 10/21/23 0905 10/21/23 1607 10/21/23 1919  BP: (!) 89/45 (!) 112/47 (!) 106/51 120/87  Pulse: (!) 56 63 (!) 57 66  Resp: 17  16 (!) 24  Temp: 98.9 F (37.2 C)   98.7 F (37.1 C)  TempSrc: Oral   Oral  SpO2: 97%  100% 97%  Weight:      Height:        Intake/Output Summary (Last  24 hours) at 10/21/2023 1935 Last data filed at 10/21/2023 1527 Gross per 24 hour  Intake 805.27 ml  Output 350 ml  Net 455.27 ml   Filed Weights   10/19/23 1700  Weight: 77.6 kg    Examination:   Constitutional: NAD CV: No cyanosis.   RESP: normal respiratory effort, on RA Extremities: No effusions, edema in BLE SKIN: warm, dry   Data Reviewed: I have personally reviewed labs and imaging studies  Time spent: 60 minutes  Ellouise Haber, MD [facility] If 7PM-7AM, please contact  night-coverage 10/21/2023, 7:35 PM

## 2023-10-21 NOTE — Plan of Care (Signed)

## 2023-10-21 NOTE — Progress Notes (Signed)
 PHARMACY - PHYSICIAN COMMUNICATION CRITICAL VALUE ALERT - BLOOD CULTURE IDENTIFICATION (BCID)  Samantha Aguirre is an 78 y.o. female who presented to [facility] on 10/19/2023 with a chief complaint of AMS, fever of unknown source.   Assessment:  E Coli in 1 of 2 bottles (anaerobic),  CTX resistance was detected.   (include suspected source if known)  Name of physician (or Provider) Contacted: [place]  Current antibiotics: Zosyn   Changes to prescribed antibiotics recommended:  Recommendations accepted by provider - will d/c zosyn and start meropenem 1 gm IV Q8H   Results for orders placed or performed during the hospital encounter of 10/19/23  Blood Culture ID Panel (Reflexed) (Collected: 10/20/2023 12:48 PM)  Result Value Ref Range   Enterococcus faecalis NOT DETECTED NOT DETECTED   Enterococcus Faecium NOT DETECTED NOT DETECTED   Listeria monocytogenes NOT DETECTED NOT DETECTED   Staphylococcus species NOT DETECTED NOT DETECTED   Staphylococcus aureus (BCID) NOT DETECTED NOT DETECTED   Staphylococcus epidermidis NOT DETECTED NOT DETECTED   Staphylococcus lugdunensis NOT DETECTED NOT DETECTED   Streptococcus species NOT DETECTED NOT DETECTED   Streptococcus agalactiae NOT DETECTED NOT DETECTED   Streptococcus pneumoniae NOT DETECTED NOT DETECTED   Streptococcus pyogenes NOT DETECTED NOT DETECTED   A.calcoaceticus-baumannii NOT DETECTED NOT DETECTED   Bacteroides fragilis NOT DETECTED NOT DETECTED   Enterobacterales DETECTED (A) NOT DETECTED   Enterobacter cloacae complex NOT DETECTED NOT DETECTED   Escherichia coli DETECTED (A) NOT DETECTED   Klebsiella aerogenes NOT DETECTED NOT DETECTED   Klebsiella oxytoca NOT DETECTED NOT DETECTED   Klebsiella pneumoniae NOT DETECTED NOT DETECTED   Proteus species NOT DETECTED NOT DETECTED   Salmonella species NOT DETECTED NOT DETECTED   Serratia marcescens NOT DETECTED NOT DETECTED   Haemophilus influenzae NOT DETECTED NOT DETECTED    Neisseria meningitidis NOT DETECTED NOT DETECTED   Pseudomonas aeruginosa NOT DETECTED NOT DETECTED   Stenotrophomonas maltophilia NOT DETECTED NOT DETECTED   Candida albicans NOT DETECTED NOT DETECTED   Candida auris NOT DETECTED NOT DETECTED   Candida glabrata NOT DETECTED NOT DETECTED   Candida krusei NOT DETECTED NOT DETECTED   Candida parapsilosis NOT DETECTED NOT DETECTED   Candida tropicalis NOT DETECTED NOT DETECTED   Cryptococcus neoformans/gattii NOT DETECTED NOT DETECTED   CTX-M ESBL DETECTED (A) NOT DETECTED   Carbapenem resistance IMP NOT DETECTED NOT DETECTED   Carbapenem resistance KPC NOT DETECTED NOT DETECTED   Carbapenem resistance NDM NOT DETECTED NOT DETECTED   Carbapenem resist OXA 48 LIKE NOT DETECTED NOT DETECTED   Carbapenem resistance VIM NOT DETECTED NOT DETECTED    Briseida Gittings D 10/21/2023  2:00 AM

## 2023-10-21 NOTE — Progress Notes (Signed)
 Pt refused IV saying no. I was unable to give 2 doses of antibiotics.   MD aware.

## 2023-10-22 DIAGNOSIS — R4182 Altered mental status, unspecified: Secondary | ICD-10-CM | POA: Diagnosis not present

## 2023-10-22 LAB — PHOSPHORUS: Phosphorus: 2.9 mg/dL (ref 2.5–4.6)

## 2023-10-22 LAB — BASIC METABOLIC PANEL WITH GFR
Anion gap: 7 (ref 5–15)
BUN: 11 mg/dL (ref 8–23)
CO2: 25 mmol/L (ref 22–32)
Calcium: 8.1 mg/dL — ABNORMAL LOW (ref 8.9–10.3)
Chloride: 108 mmol/L (ref 98–111)
Creatinine, Ser: 0.55 mg/dL (ref 0.44–1.00)
GFR, Estimated: >60 mL/min (ref >60)
Glucose, Bld: 113 mg/dL — ABNORMAL HIGH (ref 70–99)
Potassium: 3.1 mmol/L — ABNORMAL LOW (ref 3.5–5.1)
Sodium: 140 mmol/L (ref 135–145)

## 2023-10-22 LAB — MAGNESIUM: Magnesium: 2.2 mg/dL (ref 1.7–2.4)

## 2023-10-22 MED ORDER — POTASSIUM CHLORIDE 20 MEQ PO PACK
40.0000 meq | PACK | Freq: Once | ORAL | Status: AC
  Administered 2023-10-22: 40 meq via ORAL
  Filled 2023-10-22: qty 2

## 2023-10-22 MED ORDER — SODIUM CHLORIDE 0.9 % IV SOLN
1.0000 g | INTRAVENOUS | Status: DC
  Administered 2023-10-23 – 2023-10-26 (×4): 1 g via INTRAVENOUS
  Filled 2023-10-22 (×3): qty 1000
  Filled 2023-10-22: qty 1

## 2023-10-22 NOTE — Plan of Care (Signed)
  Problem: Education: Goal: Ability to describe self-care measures that may prevent or decrease complications (Diabetes Survival Skills Education) will improve Outcome: Progressing Goal: Individualized Educational Video(s) Outcome: Progressing   Problem: Coping: Goal: Ability to adjust to condition or change in health will improve Outcome: Progressing   Problem: Fluid Volume: Goal: Ability to maintain a balanced intake and output will improve Outcome: Progressing   Problem: Health Behavior/Discharge Planning: Goal: Ability to identify and utilize available resources and services will improve Outcome: Progressing Goal: Ability to manage health-related needs will improve Outcome: Progressing   Problem: Metabolic: Goal: Ability to maintain appropriate glucose levels will improve Outcome: Progressing   Problem: Nutritional: Goal: Maintenance of adequate nutrition will improve Outcome: Progressing Goal: Progress toward achieving an optimal weight will improve Outcome: Progressing   Problem: Skin Integrity: Goal: Risk for impaired skin integrity will decrease Outcome: Progressing   Problem: Tissue Perfusion: Goal: Adequacy of tissue perfusion will improve Outcome: Progressing   Problem: Education: Goal: Knowledge of General Education information will improve Description: Including pain rating scale, medication(s)/side effects and non-pharmacologic comfort measures Outcome: Progressing   Problem: Health Behavior/Discharge Planning: Goal: Ability to manage health-related needs will improve Outcome: Progressing   Problem: Clinical Measurements: Goal: Ability to maintain clinical measurements within normal limits will improve Outcome: Progressing Goal: Will remain free from infection Outcome: Progressing Goal: Diagnostic test results will improve Outcome: Progressing Goal: Respiratory complications will improve Outcome: Progressing Goal: Cardiovascular complication will  be avoided Outcome: Progressing   Problem: Activity: Goal: Risk for activity intolerance will decrease Outcome: Progressing   Problem: Nutrition: Goal: Adequate nutrition will be maintained Outcome: Progressing   Problem: Coping: Goal: Level of anxiety will decrease Outcome: Progressing   

## 2023-10-22 NOTE — Progress Notes (Signed)
  PROGRESS NOTE    Samantha Aguirre  FMW:[id number] DOB: 1945-04-02 DOA: 10/19/2023 PCP: Samantha Delon SAUNDERS, NP  109A/109A-AA  LOS: 2 days   Brief hospital course:   Assessment & Plan: Zenda Herskowitz is a 78 y.o. female with medical history significant of parkinsonism, vascular dementia with behavioral disturbances, prior strokes, COPD presented from her assisted living facility due to altered mental status, slurred speech and right leg weakness.  Patient had recent behavioral problems, risperidone dose was increased.  Further workup showed imaging with new masslike enlargement of the callosal splenium, likely CNS neoplasm.    Acute metabolic encephalopathy --multi-etiologies, including large brain mass, infection.  Mental status has been declining prior to presentation. --IV haldol  PRN or IV ativan  PRN --if lost IV access, then IM Zyprexa  New brain mass  -concerning for primary CNS neoplasm, consistent with glioblastoma.  - Neurosurgery is unable to offer resection due to its anatomical location.  Per Dr. Clois, pt has 1-2 months left, maybe less. --plan to discharge to SNF with hospice  ESBL bacteremia --cont meropenem --ID consult today  Acute hypoxia  -without apparent respiratory symptoms.   O2 sat noted to drop as low as 72-88% on room air in the ED. chest x-ray without acute findings.  Viral PCR's negative Covid/flu/RSV.   Given concern for new malignancy, PE should be ruled out.  Patient has severe allergy to IV contrast. --Continue supplemental O2 to keep sats >=90%, wean as tolerated  History of CVA  - Continue Eliquis  and Plavix  --cont statin   Parkinsonism  - Continue Sinemet   History of vascular dementia with behavioral disturbances  - Continue home Risperdal 0.5 mg twice daily - Delirium precautions   History of seizure-like activity --cont Keppra    COPD -stable without acute exacerbation, no wheezing on exam - Continue Breo - Nebulized albuterol   as needed   Type 2 diabetes  -hold home glipizide  metformin  --d/c BG checks and SSI   Hyperlipidemia -continue Lipitor  10 mg daily   Hypothyroidism -continue Synthroid    Insomnia -continue home melatonin and Remeron    GERD -continue Protonix   Hypokalemia --supplement PRN   DVT prophylaxis: On:Eliquis  Code Status: DNR  Family Communication: daughter updated on the phone today Level of care: Med-Surg Dispo:   The patient is from: ALF Anticipated d/c is to: SNF with hospice Anticipated d/c date is: tomorrow   Subjective and Interval History:  Pt was a lot more calm and cooperative today.  Able to have oral intake.     Objective: Vitals:   10/21/23 1919 10/22/23 0538 10/22/23 0825 10/22/23 1629  BP: 120/87 126/70 (!) 122/58 (!) 144/58  Pulse: 66 (!) 57 64 82  Resp: (!) 24 20 18 17   Temp: 98.7 F (37.1 C) 99.4 F (37.4 C) 99.1 F (37.3 C) 97.9 F (36.6 C)  TempSrc: Oral Oral Oral   SpO2: 97% 97% 97% 100%  Weight:      Height:       No intake or output data in the 24 hours ending 10/22/23 1919  Filed Weights   10/19/23 1700  Weight: 77.6 kg    Examination:   Constitutional: NAD, alert HEENT: conjunctivae and lids normal, EOMI CV: No cyanosis.   RESP: normal respiratory effort, on RA   Data Reviewed: I have personally reviewed labs and imaging studies  Time spent: 50 minutes  Ellouise Haber, MD [facility] If 7PM-7AM, please contact night-coverage 10/22/2023, 7:19 PM

## 2023-10-22 NOTE — Plan of Care (Signed)

## 2023-10-22 NOTE — Care Management Important Message (Signed)
 Important Message  Patient Details  Name: Samantha Aguirre MRN: [id number] Date of Birth: 1945-11-19   Important Message Given:  Yes - Medicare IM     Charmika Macdonnell W, CMA 10/22/2023, 11:40 AM

## 2023-10-22 NOTE — TOC Progression Note (Signed)
 Transition of Care [facility]) - Progression Note    Patient Details  Name: Samantha Aguirre MRN: [id number] Date of Birth: January 02, 1946  Transition of [facility]) CM/SW Contact  Dalia GORMAN Fuse, RN Phone Number: 10/22/2023, 10:34 AM  Clinical Narrative:     TOC spoke with [name] at [facility]. Because the patient is +ESBL she will need a private room. They will have a private room available tomorrow. Plan to discharge to [facility] tomorrow.  Expected Discharge Plan: Hospice Medical Facility Barriers to Discharge: SNF Pending bed offer               Expected Discharge Plan and Services   Discharge Planning Services: CM Consult Post Acute Care Choice: Hospice Living arrangements for the past 2 months: Assisted Living Facility                                       Social Drivers of Health (SDOH) Interventions SDOH Screenings   Food Insecurity: No Food Insecurity (10/19/2023)  Housing: Low Risk  (10/19/2023)  Transportation Needs: No Transportation Needs (10/19/2023)  Utilities: Not At Risk (10/19/2023)  Depression (PHQ2-9): Medium Risk (04/21/2018)  Financial Resource Strain: Low Risk  (08/18/2023)   Received from [facility]  Physical Activity: Insufficiently Active (05/19/2017)  Social Connections: Patient Unable To Answer (10/19/2023)  Stress: Stress Concern Present (05/19/2017)  Tobacco Use: High Risk (10/19/2023)    Readmission Risk Interventions     No data to display

## 2023-10-22 NOTE — Progress Notes (Addendum)
 [facility] Room 109 [facility] Liaison Note  Patient will discharge to [facility] LTC tomorrow.  Referral for hospice follow up was submitted yesterday.  [facility]'s referrals team updated on discharge location.  Please call with any hospice related questions or concerns  Thank you for the opportunity to participate in this patient's care  [facility] Liaison 336 [phone]

## 2023-10-23 ENCOUNTER — Inpatient Hospital Stay

## 2023-10-23 ENCOUNTER — Other Ambulatory Visit

## 2023-10-23 DIAGNOSIS — C719 Malignant neoplasm of brain, unspecified: Secondary | ICD-10-CM

## 2023-10-23 DIAGNOSIS — R7881 Bacteremia: Secondary | ICD-10-CM | POA: Diagnosis not present

## 2023-10-23 DIAGNOSIS — B9620 Unspecified Escherichia coli [E. coli] as the cause of diseases classified elsewhere: Secondary | ICD-10-CM

## 2023-10-23 DIAGNOSIS — A498 Other bacterial infections of unspecified site: Secondary | ICD-10-CM

## 2023-10-23 DIAGNOSIS — Z1612 Extended spectrum beta lactamase (ESBL) resistance: Secondary | ICD-10-CM

## 2023-10-23 LAB — POTASSIUM: Potassium: 3.5 mmol/L (ref 3.5–5.1)

## 2023-10-23 NOTE — Progress Notes (Signed)
  PROGRESS NOTE    Samantha Aguirre  FMW:[id number] DOB: 1945/05/22 DOA: 10/19/2023 PCP: Brutus Delon SAUNDERS, NP  109A/109A-AA  LOS: 3 days   Brief hospital course:   Assessment & Plan: Samantha Aguirre is a 78 y.o. female with medical history significant of parkinsonism, vascular dementia with behavioral disturbances, prior strokes, COPD presented from her assisted living facility due to altered mental status, slurred speech and right leg weakness.  Patient had recent behavioral problems, risperidone dose was increased.  Further workup showed imaging with new masslike enlargement of the callosal splenium, likely CNS neoplasm.    Acute metabolic encephalopathy --multi-etiologies, including large brain mass, infection.  Mental status has been declining prior to presentation. --IV haldol  PRN or IV ativan  PRN --if lost IV access, then IM Zyprexa  New brain mass  -concerning for primary CNS neoplasm, consistent with glioblastoma.  - Neurosurgery is unable to offer resection due to its anatomical location.  Per Dr. Clois, pt has 1-2 months left, maybe less. --plan to discharge to SNF with hospice  ESBL bacteremia --cont meropenem --ID consult  --switch to Ertapenem  Acute hypoxia  -without apparent respiratory symptoms.   O2 sat noted to drop as low as 72-88% on room air in the ED. chest x-ray without acute findings.  Viral PCR's negative Covid/flu/RSV.  Now back on room air.  History of CVA  - Continue Eliquis  and Plavix  --cont statin   Parkinsonism  - Continue Sinemet   History of vascular dementia with behavioral disturbances  - Continue home Risperdal 0.5 mg twice daily - Delirium precautions   History of seizure-like activity --cont Keppra    COPD -stable without acute exacerbation, no wheezing on exam - Continue Breo - Nebulized albuterol  as needed   Type 2 diabetes  -hold home glipizide  metformin  --no need for BG checks   Hyperlipidemia -continue Lipitor  10 mg  daily   Hypothyroidism -continue Synthroid    Insomnia -continue home melatonin and Remeron    GERD -continue Protonix   Hypokalemia --supplement PRN   DVT prophylaxis: On:Eliquis  Code Status: DNR  Family Communication:  Level of care: Med-Surg Dispo:   The patient is from: ALF Anticipated d/c is to: SNF  Anticipated d/c date is: when SNF can accepts   Subjective and Interval History:  No issue of agitation with pt today.   Objective: Vitals:   10/22/23 2100 10/23/23 0349 10/23/23 0822 10/23/23 1526  BP: 125/65 110/77 117/68 (!) 114/50  Pulse: 69 64 (!) 59 68  Resp:  17 14 20   Temp:  98.5 F (36.9 C) 98.4 F (36.9 C) 98.2 F (36.8 C)  TempSrc:  Oral    SpO2:  98% 97% 98%  Weight:      Height:        Intake/Output Summary (Last 24 hours) at 10/23/2023 1929 Last data filed at 10/23/2023 1300 Gross per 24 hour  Intake 800 ml  Output --  Net 800 ml    Filed Weights   10/19/23 1700  Weight: 77.6 kg    Examination:   Constitutional: NAD, alert, in mittens HEENT: conjunctivae and lids normal, EOMI CV: No cyanosis.   RESP: normal respiratory effort, on RA Neuro: II - XII grossly intact.     Data Reviewed: I have personally reviewed labs and imaging studies  Time spent: 35 minutes  Ellouise Haber, MD [facility] If 7PM-7AM, please contact night-coverage 10/23/2023, 7:29 PM

## 2023-10-23 NOTE — Plan of Care (Signed)
  Problem: Nutrition: Goal: Adequate nutrition will be maintained Outcome: Progressing   Problem: Safety: Goal: Ability to remain free from injury will improve Outcome: Progressing   

## 2023-10-23 NOTE — Plan of Care (Signed)

## 2023-10-23 NOTE — TOC Transition Note (Signed)
 Transition of Care [facility]) - Discharge Note   Patient Details  Name: Samantha Aguirre MRN: [id number] Date of Birth: 1945/08/23  [facility] CM/SW Contact:  Racheal LITTIE Schimke, RN Phone Number: 10/23/2023, 3:51 PM   Clinical Narrative: Per [facility] [place] patient does not have LTC Medicaid and they will start the application process today. Son does not want to discharge patient today, want to wait until culture results return. They facility will not be able to provide Hospice and skilled care simultaneously with current coverage. Will wait until Monday to determine discharge. MD notified.        Barriers to Discharge: SNF Pending bed offer   Patient Goals and CMS Choice     Choice offered to / list presented to : Adult Children      Discharge Placement                       Discharge Plan and Services Additional resources added to the After Visit Summary for     Discharge Planning Services: CM Consult Post Acute Care Choice: Hospice                               Social Drivers of Health (SDOH) Interventions SDOH Screenings   Food Insecurity: No Food Insecurity (10/19/2023)  Housing: Low Risk  (10/19/2023)  Transportation Needs: No Transportation Needs (10/19/2023)  Utilities: Not At Risk (10/19/2023)  Depression (PHQ2-9): Medium Risk (04/21/2018)  Financial Resource Strain: Low Risk  (08/18/2023)   Received from [facility]  Physical Activity: Insufficiently Active (05/19/2017)  Social Connections: Patient Unable To Answer (10/19/2023)  Stress: Stress Concern Present (05/19/2017)  Tobacco Use: High Risk (10/19/2023)     Readmission Risk Interventions     No data to display

## 2023-10-23 NOTE — Progress Notes (Signed)
 SLP Cancellation Note  Patient Details Name: Samantha Aguirre MRN: [id number] DOB: 1945-01-18   Cancelled treatment:       Reason Eval/Treat Not Completed:  (chart reviewed; consulted MD.)  Pt was made NPO 2 days ago d/t agitated behaviors. Per chart notes, pt has a large masslike enlargement of the callosal splenium, likely CNS neoplasm; vascular Dementia w/ behavioral disturbances.  MD has since re-ordered the oral diet (a mech soft, thins as recommended by ST eval on 10/7). NSG is monitoring pt's oral intake; aspiration precautions w/ any oral intake.  MD to reconsult ST services if needs arise during admit. Pt is d/t Discharge to SNF w/ Hospice as per the POC.    Samantha Portugal, MS, CCC-SLP Speech Language Pathologist Rehab Services; [facility] [phone] (ascom) Samantha Aguirre 10/23/2023, 4:04 PM

## 2023-10-23 NOTE — Consult Note (Addendum)
 NAME: Samantha Aguirre  DOB: 1945/05/16  MRN: [id number]  Date/Time: 10/23/2023 12:41 PM  REQUESTING PROVIDER: Ovid Subjective:  REASON FOR CONSULT: ESBL E. coli bacteremia No history available from patient.  Chart reviewed  Samantha Aguirre is a 78 y.o. with a history of parkinsonism, dementia, atrial fibrillation on eliquis , diabetes mellitus, hypertension, seizures, COPD , rt reverse shoulder arthroplasty, kyphoplasty who presented to the ED from assisted living facility for altered mental status, slurred speech and right leg weakness. As per EMS note the nursing staff at the facility had mentioned that she was at her baseline prior to them walking out of the room to serve lunch.  When they return they found the patient on the couch responsive only to verbal stimuli.  She was weak on the right side.  But by the time EMS was called and they arrived the patient was alert to baseline and able to answer questions.  She had a BP of 123/64 and oxygen  sats of 99% and temperature of 98.4 and a plasma coma scale was 15.  Patient was on Eliquis  and atorvastatin . In the ED BP was 123/63, pulse 80, respiratory rate 18 and sats of 100% and temperature was 98.2.  She was awake in no distress she had mild slurred speech pupils were equal and there is no obvious upper extremity weakness but she had weakness to her right lower extremity as per the ED note The labs revealed a hemoglobin of 11, glucose of 172, EKG showed sinus rhythm of 80.  Blood culture was sent CT head was done that was abnormal and an MRI was done which showed a masslike enlargement of the callosal splenium extending to the medial parietal lobes.  There was prominent surrounding T2 flair hyperintense signal abnormality within the bilateral parieto-occipital white matter.  The differential diagnosis was a high-grade primary CNS neoplasm like glioblastoma multiforme on or lymphoma or tumefactive demyelination which was less likely.  They  recommended a postcontrast MRI.  She was seen by neurosurgery and not thought to be a candidate for surgery.  She was seen by neurology on 10/20/2023.   and was more confused.  Neurology did not think that her change in mental status was due to ongoing seizures though she had a CNS lesion  she was placed on  Keppra .  As she had  fever a blood culture was sent on 10/20/2023 and came back as ESBL E. coli and I am seeing the patient for the same.  She has been on meropenem. She is more awake and alert She responds to simple question she is confused  Past Medical History:  Diagnosis Date   Acid reflux    Arthritis    Bilateral knees, hands, feet   Asthma    Atrial fibrillation (HCC)    Cancer (HCC)    CHF (congestive heart failure) (HCC)    Diastolic CHF   COPD (chronic obstructive pulmonary disease) (HCC)    Coronary artery disease    Diabetes mellitus without complication (HCC)    Frequent headaches    GI bleed    HLD (hyperlipidemia)    Hypertension    Seizures (HCC)    Skin cancer 2015   Suspected basal cell carcinoma on nose, surgically removed   Stroke (HCC) 04/2017   Thyroid  disease    Tremors of nervous system     Past Surgical History:  Procedure Laterality Date   ABDOMINAL HYSTERECTOMY  1976   APPENDECTOMY  1980   BACK SURGERY  cardiac stents     CAROTID ENDARTERECTOMY     CHOLECYSTECTOMY  1980   COLONOSCOPY N/A 02/20/2017   Procedure: COLONOSCOPY;  Surgeon: Unk Corinn Skiff, MD;  Location: [facility] ENDOSCOPY;  Service: Gastroenterology;  Laterality: N/A;   ESOPHAGOGASTRODUODENOSCOPY N/A 02/20/2017   Procedure: ESOPHAGOGASTRODUODENOSCOPY (EGD);  Surgeon: Unk Corinn Skiff, MD;  Location: [facility] ENDOSCOPY;  Service: Gastroenterology;  Laterality: N/A;   ESOPHAGOGASTRODUODENOSCOPY (EGD) WITH PROPOFOL  N/A 07/08/2017   Procedure: ESOPHAGOGASTRODUODENOSCOPY (EGD) WITH PROPOFOL ;  Surgeon: Jinny Carmine, MD;  Location: [facility] ENDOSCOPY;  Service: Endoscopy;  Laterality: N/A;    GALLBLADDER SURGERY  1980   GIVENS CAPSULE STUDY N/A 07/09/2017   Procedure: GIVENS CAPSULE STUDY;  Surgeon: Therisa Bi, MD;  Location: [facility] ENDOSCOPY;  Service: Gastroenterology;  Laterality: N/A;   KNEE SURGERY     left   KYPHOPLASTY N/A 03/16/2018   Procedure: KYPHOPLASTY, L5;  Surgeon: Kathlynn Sharper, MD;  Location: [facility] ORS;  Service: Orthopedics;  Laterality: N/A;   REVERSE SHOULDER ARTHROPLASTY Right 08/25/2019   Procedure: REVERSE SHOULDER ARTHROPLASTY;  Surgeon: Edie Norleen PARAS, MD;  Location: [facility] ORS;  Service: Orthopedics;  Laterality: Right;   ROTATOR CUFF REPAIR     right   TONSILLECTOMY  1950    Social History   Socioeconomic History   Marital status: Divorced    Spouse name: Not on file   Number of children: 2   Years of education: 12   Highest education level: 12th grade  Occupational History   Occupation: Disabled  Tobacco Use   Smoking status: Every Day    Current packs/day: 1.00    Average packs/day: 1 pack/day for 53.0 years (53.0 ttl pk-yrs)    Types: Cigarettes   Smokeless tobacco: Never  Vaping Use   Vaping status: Former  Substance and Sexual Activity   Alcohol use: No   Drug use: No   Sexual activity: Not Currently  Other Topics Concern   Not on file  Social History Narrative   None   Social Drivers of Health   Financial Resource Strain: Low Risk  (08/18/2023)   Received from [facility]   Overall Financial Resource Strain (CARDIA)    Difficulty of Paying Living Expenses: Not very hard  Food Insecurity: No Food Insecurity (10/19/2023)   Hunger Vital Sign    Worried About Running Out of Food in the Last Year: Never true    Ran Out of Food in the Last Year: Never true  Transportation Needs: No Transportation Needs (10/19/2023)   PRAPARE - Administrator, Civil Service (Medical): No    Lack of Transportation (Non-Medical): No  Physical Activity: Insufficiently Active (05/19/2017)   Exercise Vital Sign    Days of Exercise  per Week: 2 days    Minutes of Exercise per Session: 20 min  Stress: Stress Concern Present (05/19/2017)   [employer] of Occupational Health - Occupational Stress Questionnaire    Feeling of Stress : Rather much  Social Connections: Patient Unable To Answer (10/19/2023)   Social Connection and Isolation Panel    Frequency of Communication with Friends and Family: Patient unable to answer    Frequency of Social Gatherings with Friends and Family: Patient unable to answer    Attends Religious Services: Patient unable to answer    Active Member of Clubs or Organizations: Patient unable to answer    Attends Banker Meetings: Patient unable to answer    Marital Status: Patient unable to answer  Intimate Partner Violence: Patient  Unable To Answer (10/19/2023)   Humiliation, Afraid, Rape, and Kick questionnaire    Fear of Current or Ex-Partner: Patient unable to answer    Emotionally Abused: Patient unable to answer    Physically Abused: Patient unable to answer    Sexually Abused: Patient unable to answer    Family History  Problem Relation Age of Onset   Breast cancer Mother    Heart disease Mother    Stroke Mother    Cancer Mother    COPD Mother    Diabetes Mother    Heart disease Father    Diabetes Father    Stroke Father    Alcohol abuse Sister    Drug abuse Sister    Stroke Sister    Cancer Sister    Mental illness Sister    Heart disease Brother    Arthritis Brother    Diabetes Brother    Heart disease Maternal Grandfather    Heart disease Paternal Grandfather    Allergies  Allergen Reactions   Ivp Dye [Iodinated Contrast Media] Itching    Pt injected with IV contrast.  5 min after injection pt complained of itching behind ear and on her abd.   Methotrexate Rash   Morphine  And Codeine Other (See Comments)    stops my breathing NEAR-RESPIRATORY ARREST   Mushroom Extract Complex (Obsolete) Anaphylaxis    Made patient deathly sick   Atenolol Other  (See Comments)    MD took me off of it bc it was doing something wrong Made patient HYPOTENSIVE   Percocet [Oxycodone-Acetaminophen ] Nausea And Vomiting and Other (See Comments)    Stomach pains   Percodan [Oxycodone-Aspirin ] Nausea And Vomiting and Other (See Comments)    Stomach pains   Tramadol  Nausea Only   Verapamil Other (See Comments)     MD told me this was screwing me up MAKES PATIENT HYPOTENSIVE   Aspirin     Codeine    Oxycodone Hcl    I? Current Facility-Administered Medications  Medication Dose Route Frequency Provider Last Rate Last Admin   acetaminophen  (TYLENOL ) tablet 650 mg  650 mg Oral Q6H PRN Fausto Sor A, DO       Or   acetaminophen  (TYLENOL ) suppository 650 mg  650 mg Rectal Q6H PRN Fausto Sor A, DO       albuterol  (PROVENTIL ) (2.5 MG/3ML) 0.083% nebulizer solution 2.5 mg  2.5 mg Nebulization Q6H PRN Fausto Sor A, DO       apixaban  (ELIQUIS ) tablet 5 mg  5 mg Oral BID Fausto Sor A, DO   5 mg at 10/23/23 1042   atorvastatin  (LIPITOR ) tablet 10 mg  10 mg Oral Daily Fausto Sor A, DO   10 mg at 10/23/23 1040   carbidopa-levodopa (SINEMET IR) 25-100 MG per tablet immediate release 1 tablet  1 tablet Oral TID Fausto Sor A, DO   1 tablet at 10/23/23 1039   clopidogrel  (PLAVIX ) tablet 75 mg  75 mg Oral Daily Fausto Sor A, DO   75 mg at 10/23/23 1040   DULoxetine  (CYMBALTA ) DR capsule 60 mg  60 mg Oral Daily Fausto Sor A, DO   60 mg at 10/23/23 1043   ertapenem (INVANZ) 1 g in sodium chloride  0.9 % 100 mL IVPB  1 g Intravenous Q24H Murry Diaz, Donald, MD 200 mL/hr at 10/23/23 0644 1 g at 10/23/23 0644   fluticasone  furoate-vilanterol (BREO ELLIPTA ) 100-25 MCG/ACT 1 puff  1 puff Inhalation Daily Fausto Sor A, DO   1 puff at 10/23/23  1044   haloperidol  lactate (HALDOL ) injection 2 mg  2 mg Intravenous Q6H PRN Awanda City, MD   2 mg at 10/22/23 1757   levETIRAcetam  (KEPPRA ) undiluted injection 500 mg  500 mg Intravenous BID  Fausto Sor A, DO   500 mg at 10/23/23 1043   levothyroxine  (SYNTHROID ) tablet 125 mcg  125 mcg Oral Daily Fausto Sor LABOR, DO   125 mcg at 10/23/23 9367   LORazepam  (ATIVAN ) injection 0.5 mg  0.5 mg Intravenous Q6H PRN Fausto Sor A, DO   0.5 mg at 10/21/23 1108   melatonin tablet 10 mg  10 mg Oral QHS Fausto Sor A, DO   10 mg at 10/22/23 2122   mirtazapine  (REMERON  SOL-TAB) disintegrating tablet 15 mg  15 mg Oral QHS Fausto Sor A, DO   15 mg at 10/22/23 2123   ondansetron  (ZOFRAN ) injection 4 mg  4 mg Intravenous Q6H PRN Ponnala, Shruthi, MD   4 mg at 10/20/23 1137   pantoprazole  (PROTONIX ) EC tablet 40 mg  40 mg Oral Daily Fausto Sor A, DO   40 mg at 10/23/23 1039   polyethylene glycol (MIRALAX  / GLYCOLAX ) packet 17 g  17 g Oral Daily PRN Fausto Sor A, DO       risperiDONE (RISPERDAL) tablet 0.5 mg  0.5 mg Oral BID Fausto Sor A, DO   0.5 mg at 10/23/23 1043   sorbitol 70 % solution 30 mL  30 mL Oral Daily PRN Fausto Sor A, DO         Abtx:  Anti-infectives (From admission, onward)    Start     Dose/Rate Route Frequency Ordered Stop   10/23/23 0600  ertapenem (INVANZ) 1 g in sodium chloride  0.9 % 100 mL IVPB        1 g 200 mL/hr over 30 Minutes Intravenous Every 24 hours 10/22/23 1549     10/21/23 0245  meropenem (MERREM) 1 g in sodium chloride  0.9 % 100 mL IVPB        1 g 200 mL/hr over 30 Minutes Intravenous Every 8 hours 10/21/23 0159 10/23/23 0459   10/20/23 1400  piperacillin-tazobactam (ZOSYN) IVPB 3.375 g  Status:  Discontinued        3.375 g 12.5 mL/hr over 240 Minutes Intravenous Every 8 hours 10/20/23 1027 10/21/23 0155       REVIEW OF SYSTEMS:  Unable to get review of system Objective:  VITALS:  BP 117/68 (BP Location: Left Arm)   Pulse (!) 59   Temp 98.4 F (36.9 C)   Resp 14   Ht 5' 1 (1.549 m)   Wt 77.6 kg   SpO2 97%   BMI 32.32 kg/m  LDA Foley Central line Other drainage tubes PHYSICAL EXAM:  General: awake,  responds to some questions- oriented in person and place. confusion . In the recliner but lying on one side Head: Normocephalic, without obvious abnormality, atraumatic. Eyes: Conjunctivae clear, anicteric sclerae. Pupils are equal ENT Nares normal. No drainage or sinus tenderness. Lips, mucosa, and tongue normal. No Thrush Neck: , symmetrical, no adenopathy, thyroid : non tender no carotid bruit and no JVD. Back: No CVA tenderness. Lungs:b/la ir entry Heart: irregular- rate controlled. Abdomen: Soft, non-tender,not distended. Bowel sounds normal. No masses Extremities: atraumatic, no cyanosis. No edema. No clubbing Skin: No rashes or lesions. Or bruising Lymph: Cervical, supraclavicular normal. Neurologic: Grossly non-focal, cannot examine in detail Pertinent Labs Lab Results CBC    Component Value Date/Time   WBC 6.3 10/21/2023 0353  RBC 3.45 (L) 10/21/2023 0353   HGB 9.8 (L) 10/21/2023 0353   HCT 31.0 (L) 10/21/2023 0353   PLT 168 10/21/2023 0353   MCV 89.9 10/21/2023 0353   MCH 28.4 10/21/2023 0353   MCHC 31.6 10/21/2023 0353   RDW 13.9 10/21/2023 0353   LYMPHSABS 0.4 (L) 10/19/2023 1304   MONOABS 0.6 10/19/2023 1304   EOSABS 0.0 10/19/2023 1304   BASOSABS 0.0 10/19/2023 1304       Latest Ref Rng & Units 10/23/2023    6:48 AM 10/22/2023    4:54 AM 10/21/2023    3:53 AM  CMP  Glucose 70 - 99 mg/dL  886  852   BUN 8 - 23 mg/dL  11  13   Creatinine 9.55 - 1.00 mg/dL  9.44  9.42   Sodium 864 - 145 mmol/L  140  137   Potassium 3.5 - 5.1 mmol/L 3.5  3.1  3.2   Chloride 98 - 111 mmol/L  108  103   CO2 22 - 32 mmol/L  25  25   Calcium  8.9 - 10.3 mg/dL  8.1  8.3       Microbiology: Recent Results (from the past 240 hours)  Resp panel by RT-PCR (RSV, Flu A&B, Covid) Anterior Nasal Swab     Status: None   Collection Time: 10/19/23  2:54 PM   Specimen: Anterior Nasal Swab  Result Value Ref Range Status   SARS Coronavirus 2 by RT PCR NEGATIVE NEGATIVE Final     Comment: (NOTE) SARS-CoV-2 target nucleic acids are NOT DETECTED.  The SARS-CoV-2 RNA is generally detectable in upper respiratory specimens during the acute phase of infection. The lowest concentration of SARS-CoV-2 viral copies this assay can detect is 138 copies/mL. A negative result does not preclude SARS-Cov-2 infection and should not be used as the sole basis for treatment or other patient management decisions. A negative result may occur with  improper specimen collection/handling, submission of specimen other than nasopharyngeal swab, presence of viral mutation(s) within the areas targeted by this assay, and inadequate number of viral copies(<138 copies/mL). A negative result must be combined with clinical observations, patient history, and epidemiological information. The expected result is Negative.  Fact Sheet for Patients:  BloggerCourse.com  Fact Sheet for Healthcare Providers:  SeriousBroker.it  This test is no t yet approved or cleared by the [place]  FDA and  has been authorized for detection and/or diagnosis of SARS-CoV-2 by FDA under an Emergency Use Authorization (EUA). This EUA will remain  in effect (meaning this test can be used) for the duration of the COVID-19 declaration under Section 564(b)(1) of the Act, 21 U.S.C.section 360bbb-3(b)(1), unless the authorization is terminated  or revoked sooner.       Influenza A by PCR NEGATIVE NEGATIVE Final   Influenza B by PCR NEGATIVE NEGATIVE Final    Comment: (NOTE) The Xpert Xpress SARS-CoV-2/FLU/RSV plus assay is intended as an aid in the diagnosis of influenza from Nasopharyngeal swab specimens and should not be used as a sole basis for treatment. Nasal washings and aspirates are unacceptable for Xpert Xpress SARS-CoV-2/FLU/RSV testing.  Fact Sheet for Patients: BloggerCourse.com  Fact Sheet for Healthcare  Providers: SeriousBroker.it  This test is not yet approved or cleared by the [place]  FDA and has been authorized for detection and/or diagnosis of SARS-CoV-2 by FDA under an Emergency Use Authorization (EUA). This EUA will remain in effect (meaning this test can be used) for the duration of the COVID-19 declaration under  Section 564(b)(1) of the Act, 21 U.S.C. section 360bbb-3(b)(1), unless the authorization is terminated or revoked.     Resp Syncytial Virus by PCR NEGATIVE NEGATIVE Final    Comment: (NOTE) Fact Sheet for Patients: BloggerCourse.com  Fact Sheet for Healthcare Providers: SeriousBroker.it  This test is not yet approved or cleared by the [place]  FDA and has been authorized for detection and/or diagnosis of SARS-CoV-2 by FDA under an Emergency Use Authorization (EUA). This EUA will remain in effect (meaning this test can be used) for the duration of the COVID-19 declaration under Section 564(b)(1) of the Act, 21 U.S.C. section 360bbb-3(b)(1), unless the authorization is terminated or revoked.  Performed at [facility], [address] [place], [place] [address]   Respiratory (~20 pathogens) panel by PCR     Status: None   Collection Time: 10/20/23 10:45 AM   Specimen: Nasopharyngeal Swab; Respiratory  Result Value Ref Range Status   Adenovirus NOT DETECTED NOT DETECTED Final   Coronavirus 229E NOT DETECTED NOT DETECTED Final    Comment: (NOTE) The Coronavirus on the Respiratory Panel, DOES NOT test for the novel  Coronavirus (2019 nCoV)    Coronavirus HKU1 NOT DETECTED NOT DETECTED Final   Coronavirus NL63 NOT DETECTED NOT DETECTED Final   Coronavirus OC43 NOT DETECTED NOT DETECTED Final   Metapneumovirus NOT DETECTED NOT DETECTED Final   Rhinovirus / Enterovirus NOT DETECTED NOT DETECTED Final   Influenza A NOT DETECTED NOT DETECTED Final   Influenza B NOT  DETECTED NOT DETECTED Final   Parainfluenza Virus 1 NOT DETECTED NOT DETECTED Final   Parainfluenza Virus 2 NOT DETECTED NOT DETECTED Final   Parainfluenza Virus 3 NOT DETECTED NOT DETECTED Final   Parainfluenza Virus 4 NOT DETECTED NOT DETECTED Final   Respiratory Syncytial Virus NOT DETECTED NOT DETECTED Final   Bordetella pertussis NOT DETECTED NOT DETECTED Final   Bordetella Parapertussis NOT DETECTED NOT DETECTED Final   Chlamydophila pneumoniae NOT DETECTED NOT DETECTED Final   Mycoplasma pneumoniae NOT DETECTED NOT DETECTED Final    Comment: Performed at [facility], [address], [place], [place] [address]  Culture, blood (Routine X 2) w Reflex to ID Panel     Status: Abnormal (Preliminary result)   Collection Time: 10/20/23 12:48 PM   Specimen: BLOOD LEFT ARM  Result Value Ref Range Status   Specimen Description   Final    BLOOD LEFT ARM Performed at [facility], [address], [place], [place] [address]    Special Requests   Final    BOTTLES DRAWN AEROBIC AND ANAEROBIC Blood Culture adequate volume Performed at [facility], [address] [place], [place] [address]    Culture  Setup Time   Final    Organism ID to follow GRAM NEGATIVE RODS IN BOTH AEROBIC AND ANAEROBIC BOTTLES CRITICAL RESULT CALLED TO, READ BACK BY AND VERIFIED WITH: JASON ROBBINS PHARMD @0148  10/21/23 ASW GRAM STAIN REVIEWED-AGREE WITH RESULT DRT    Culture (A)  Final    ESCHERICHIA COLI SUSCEPTIBILITIES TO FOLLOW Performed at [facility], [address], [place], [place] [address]    Report Status PENDING  Incomplete  Blood Culture ID Panel (Reflexed)     Status: Abnormal   Collection Time: 10/20/23 12:48 PM  Result Value Ref Range Status   Enterococcus faecalis NOT DETECTED NOT DETECTED Final   Enterococcus Faecium NOT DETECTED NOT DETECTED Final   Listeria monocytogenes NOT DETECTED NOT DETECTED Final   Staphylococcus  species NOT DETECTED NOT DETECTED Final    Staphylococcus aureus (BCID) NOT DETECTED NOT DETECTED Final   Staphylococcus epidermidis NOT DETECTED NOT DETECTED Final   Staphylococcus lugdunensis NOT DETECTED NOT DETECTED Final   Streptococcus species NOT DETECTED NOT DETECTED Final   Streptococcus agalactiae NOT DETECTED NOT DETECTED Final   Streptococcus pneumoniae NOT DETECTED NOT DETECTED Final   Streptococcus pyogenes NOT DETECTED NOT DETECTED Final   A.calcoaceticus-baumannii NOT DETECTED NOT DETECTED Final   Bacteroides fragilis NOT DETECTED NOT DETECTED Final   Enterobacterales DETECTED (A) NOT DETECTED Final    Comment: Enterobacterales represent a large order of gram negative bacteria, not a single organism. CRITICAL RESULT CALLED TO, READ BACK BY AND VERIFIED WITH: JASON ROBBINS PHARMD @0148  10/21/23 ASW    Enterobacter cloacae complex NOT DETECTED NOT DETECTED Final   Escherichia coli DETECTED (A) NOT DETECTED Final    Comment: CRITICAL RESULT CALLED TO, READ BACK BY AND VERIFIED WITH: JASON ROBBINS PHARMD @0148  10/21/23 ASW    Klebsiella aerogenes NOT DETECTED NOT DETECTED Final   Klebsiella oxytoca NOT DETECTED NOT DETECTED Final   Klebsiella pneumoniae NOT DETECTED NOT DETECTED Final   Proteus species NOT DETECTED NOT DETECTED Final   Salmonella species NOT DETECTED NOT DETECTED Final   Serratia marcescens NOT DETECTED NOT DETECTED Final   Haemophilus influenzae NOT DETECTED NOT DETECTED Final   Neisseria meningitidis NOT DETECTED NOT DETECTED Final   Pseudomonas aeruginosa NOT DETECTED NOT DETECTED Final   Stenotrophomonas maltophilia NOT DETECTED NOT DETECTED Final   Candida albicans NOT DETECTED NOT DETECTED Final   Candida auris NOT DETECTED NOT DETECTED Final   Candida glabrata NOT DETECTED NOT DETECTED Final   Candida krusei NOT DETECTED NOT DETECTED Final   Candida parapsilosis NOT DETECTED NOT DETECTED Final   Candida tropicalis NOT DETECTED NOT DETECTED Final   Cryptococcus neoformans/gattii NOT  DETECTED NOT DETECTED Final   CTX-M ESBL DETECTED (A) NOT DETECTED Final    Comment: CRITICAL RESULT CALLED TO, READ BACK BY AND VERIFIED WITH: JASON ROBBINS PHARMD @0148  10/21/23 ASW (NOTE) Extended spectrum beta-lactamase detected. Recommend a carbapenem as initial therapy.      Carbapenem resistance IMP NOT DETECTED NOT DETECTED Final   Carbapenem resistance KPC NOT DETECTED NOT DETECTED Final   Carbapenem resistance NDM NOT DETECTED NOT DETECTED Final   Carbapenem resist OXA 48 LIKE NOT DETECTED NOT DETECTED Final   Carbapenem resistance VIM NOT DETECTED NOT DETECTED Final    Comment: Performed at [facility], [address] [place], [place] [address]      IMAGING RESULTS: MRI of the brain shows a masslike enlargement of the callosal splenium extending to the medial parietal lobes.  This is new from the prior MRI of 05/17/2023 Differential consideration include a high-grade primary CNS lymphoma or a glioblastoma multiforme he.  Tumefactive demyelination considered less likely.  Recommended a postcontrast MR unchanged small chronic infarcts within the bilateral cerebellar hemispheres.  And background advanced cerebral white matter disease. Generalized parenchymal atrophy.  I have personally reviewed the films ? Impression/Recommendation ? Altered mental status/encephalopathy with underlying dementia, seizures MRI shows a new callosal splenium lesion concerning for glioblastoma multiform a Seen by neurosurgery Not a candidate for surgery because of the location ' ESBL E. coli bacteremia Source is unclear  no urine culture sent Will get a postvoid bladder scan Also will look at the kidneys for any hydronephrosis or stone if aggressive management is pursued. patient is currently on ertapenem  susceptibility pending If a  p.o. option is available then we can switch to that otherwise we will have to continue ertapenem for 7 days  Seizure disorder on Keppra   Paroxysmal  Atrial fibrillation on Eliquis  Parkinsons disease on sinemet  Dementia with behavioral issues Patient is on as needed Haldol , mirtazapine , risperidone  Hypothyroidism on Synthroid  History of right shoulder reverse arthroplasty  Hypertension management as per primary team  This consult involve complex antimicrobial management. __I have personally spent  -75--minutes involved in face-to-face and non-face-to-face activities for this patient on the day of the visit. Professional time spent includes the following activities: Preparing to see the patient (review of tests), Obtaining and/or reviewing separately obtained history (admission/discharge record), Performing a medically appropriate examination and/or evaluation , Ordering medications/tests/procedures, referring and communicating with other health care professionals, Documenting clinical information in the EMR, Independently interpreting results (not separately reported), Communicating results to the patient/ Counseling and educating the patient/f and Care coordination (not separately reported).      ________________________________________________ Discussed with requesting provider ID will follow her peripherally this weekend- call if needed Note:  This document was prepared using Dragon voice recognition software and may include unintentional dictation errors.

## 2023-10-24 DIAGNOSIS — R4182 Altered mental status, unspecified: Secondary | ICD-10-CM | POA: Diagnosis not present

## 2023-10-24 LAB — CULTURE, BLOOD (ROUTINE X 2): Special Requests: ADEQUATE

## 2023-10-24 NOTE — Progress Notes (Signed)
  PROGRESS NOTE    Samantha Aguirre  FMW:[id number] DOB: 08-Mar-1945 DOA: 10/19/2023 PCP: Brutus Delon SAUNDERS, NP  109A/109A-AA  LOS: 4 days   Brief hospital course:   Assessment & Plan: Samantha Aguirre is a 78 y.o. female with medical history significant of parkinsonism, vascular dementia with behavioral disturbances, prior strokes, COPD presented from her assisted living facility due to altered mental status, slurred speech and right leg weakness.  Patient had recent behavioral problems, risperidone dose was increased.  Further workup showed imaging with new masslike enlargement of the callosal splenium, likely CNS neoplasm.    Acute metabolic encephalopathy --multi-etiologies, including large brain mass, infection.  Mental status has been declining prior to presentation. --IV haldol  PRN or IV ativan  PRN --if lost IV access, then IM Zyprexa  New brain mass  -concerning for primary CNS neoplasm, consistent with glioblastoma.  - Neurosurgery is unable to offer resection due to its anatomical location.  Per Dr. Clois, pt has 1-2 months left, maybe less. --plan to discharge to SNF with hospice  ESBL bacteremia --cont meropenem, switched to Ertapenem --ID consult  --cont Ertapenem  Acute hypoxia  -without apparent respiratory symptoms.   O2 sat noted to drop as low as 72-88% on room air in the ED. chest x-ray without acute findings.  Viral PCR's negative Covid/flu/RSV.  Now back on room air.  History of CVA  - Continue Eliquis  and Plavix  --cont statin   Parkinsonism  - Continue Sinemet   History of vascular dementia with behavioral disturbances  - Continue home Risperdal 0.5 mg twice daily - Delirium precautions   History of seizure-like activity --cont Keppra    COPD -stable without acute exacerbation, no wheezing on exam - Continue Breo - Nebulized albuterol  as needed   Type 2 diabetes  -hold home glipizide  metformin  --no need for BG checks   Hyperlipidemia   --cont statin   Hypothyroidism -continue Synthroid    Insomnia -continue home melatonin and Remeron    GERD -continue Protonix   Hypokalemia --supplement PRN   DVT prophylaxis: On:Eliquis  Code Status: DNR  Family Communication:  Level of care: Med-Surg Dispo:   The patient is from: ALF Anticipated d/c is to: SNF  Anticipated d/c date is: when SNF can accepts   Subjective and Interval History:  No new event today.   Objective: Vitals:   10/23/23 1950 10/24/23 0413 10/24/23 0929 10/24/23 1607  BP: (!) 114/57 (!) 109/58 (!) 128/59 (!) 136/56  Pulse: 67 (!) 51 60 67  Resp: 20 19 18 18   Temp: 98.9 F (37.2 C) (!) 97.4 F (36.3 C) 98.6 F (37 C) 97.6 F (36.4 C)  TempSrc: Oral Axillary Oral Oral  SpO2: 95% 90% 93% 96%  Weight:      Height:        Intake/Output Summary (Last 24 hours) at 10/24/2023 1851 Last data filed at 10/24/2023 1700 Gross per 24 hour  Intake 205 ml  Output --  Net 205 ml    Filed Weights   10/19/23 1700  Weight: 77.6 kg    Examination:   Constitutional: NAD CV: No cyanosis.   RESP: normal respiratory effort, on RA   Data Reviewed: I have personally reviewed labs and imaging studies  Time spent: 35 minutes  Ellouise Haber, MD [facility] If 7PM-7AM, please contact night-coverage 10/24/2023, 6:51 PM

## 2023-10-24 NOTE — Progress Notes (Signed)
 [facility]) Liaison Note  Follow up on new referral for hospice at discharge.  Patient was set for hospital discharge today to [facility].  Called and spoke with patient's daughter, who had multiple questions about discharge plan.  She did not understand what her mom would be receiving at [facility]- because they told her she couldn't be under hospice- but would be under palliative.  After further calls- [facility] is planning on STR for patient and then offering a LTC bed.  I explained the difference between hospice and palliative care. Family is really wanting hospice for the patient- as they feel like she cannot do any exercises.   I referred her to call [name] (at [facility]) back and to talk with Racheal Schimke, TOC at [facility].     After call, I communicated with Awanya and she validated that [facility] had offered a STR bed. According to notes- [facility] is initiating a LTC Medicaid application today.  Family does not want patient discharged today because they would like to wait on pending blood cultures.  Patient will not discharge this weekend.  Patient pending approval for Medicaid/LTC.  [facility] will continue to follow through final disposition.  Please do not hesitate to call with any hospice related questions or concerns.  Saddie HILARIO Na, RN Nurse Liaison [phone]

## 2023-10-24 NOTE — Plan of Care (Signed)

## 2023-10-24 NOTE — Plan of Care (Signed)
  Problem: Education: Goal: Ability to describe self-care measures that may prevent or decrease complications (Diabetes Survival Skills Education) will improve Outcome: Progressing   Problem: Health Behavior/Discharge Planning: Goal: Ability to identify and utilize available resources and services will improve Outcome: Progressing Goal: Ability to manage health-related needs will improve Outcome: Progressing   Problem: Metabolic: Goal: Ability to maintain appropriate glucose levels will improve Outcome: Progressing   Problem: Skin Integrity: Goal: Risk for impaired skin integrity will decrease Outcome: Progressing   Problem: Tissue Perfusion: Goal: Adequacy of tissue perfusion will improve Outcome: Progressing

## 2023-10-25 DIAGNOSIS — R4182 Altered mental status, unspecified: Secondary | ICD-10-CM | POA: Diagnosis not present

## 2023-10-25 NOTE — Progress Notes (Signed)
 [facility]) Liaison Note   Follow up on new referral for hospice at discharge.   Patient with no plans for discharge this weekend.  Patient pending approval for Medicaid LTC.     [facility] will continue to follow through final disposition.   Please do not hesitate to call with any hospice related questions or concerns.  Saddie HILARIO Na, RN Nurse Liaison [phone]

## 2023-10-25 NOTE — Progress Notes (Signed)
  PROGRESS NOTE    Samantha Aguirre  FMW:[id number] DOB: 1945-06-10 DOA: 10/19/2023 PCP: Brutus Delon SAUNDERS, NP  109A/109A-AA  LOS: 5 days   Brief hospital course:   Assessment & Plan: Samantha Aguirre is a 78 y.o. female with medical history significant of parkinsonism, vascular dementia with behavioral disturbances, prior strokes, COPD presented from her assisted living facility due to altered mental status, slurred speech and right leg weakness.  Patient had recent behavioral problems, risperidone dose was increased.  Further workup showed imaging with new masslike enlargement of the callosal splenium, likely CNS neoplasm.    Acute metabolic encephalopathy --multi-etiologies, including large brain mass, infection.  Mental status has been declining prior to presentation. --IV haldol  PRN or IV ativan  PRN --if lost IV access, then IM Zyprexa  New brain mass  -concerning for primary CNS neoplasm, consistent with glioblastoma.  - Neurosurgery is unable to offer resection due to its anatomical location.  Per Dr. Clois, pt has 1-2 months left, maybe less. --plan to discharge to SNF with hospice  ESBL bacteremia --cont meropenem, switched to Ertapenem --ID consult  --cont Ertapenem  Acute hypoxia  -without apparent respiratory symptoms.   O2 sat noted to drop as low as 72-88% on room air in the ED. chest x-ray without acute findings.  Viral PCR's negative Covid/flu/RSV.  Now back on room air.  History of CVA  - Continue Eliquis  and Plavix  --cont statin   Parkinsonism  - Continue Sinemet   History of vascular dementia with behavioral disturbances  - Continue home Risperdal 0.5 mg twice daily - Delirium precautions   History of seizure-like activity --cont Keppra    COPD -stable without acute exacerbation, no wheezing on exam - Continue Breo - Nebulized albuterol  as needed   Type 2 diabetes  -hold home glipizide  metformin  --no need for BG checks   Hyperlipidemia   --cont statin   Hypothyroidism  --cont Synthroid    Insomnia  -continue home melatonin and Remeron    GERD -continue Protonix   Hypokalemia --supplement PRN   DVT prophylaxis: On:Eliquis  Code Status: DNR  Family Communication:  Level of care: Med-Surg Dispo:   The patient is from: ALF Anticipated d/c is to: SNF  Anticipated d/c date is: when SNF can accepts   Subjective and Interval History:  Pt was calm and cooperative today.     Objective: Vitals:   10/25/23 0442 10/25/23 0833 10/25/23 1231 10/25/23 1627  BP: (!) 125/58 109/64 (!) 108/56 (!) (P) 104/94  Pulse: 61 60 (!) 58 (!) (P) 58  Resp: 15 14 14  (P) 14  Temp: 98.1 F (36.7 C) 98 F (36.7 C) 98 F (36.7 C) (P) 97.6 F (36.4 C)  TempSrc:  Oral Oral (P) Oral  SpO2: 94% 95% 93% (P) 94%  Weight:      Height:       No intake or output data in the 24 hours ending 10/25/23 1753   Filed Weights   10/19/23 1700  Weight: 77.6 kg    Examination:   Constitutional: NAD, alert HEENT: conjunctivae and lids normal, EOMI CV: No cyanosis.   RESP: normal respiratory effort, on RA Extremities: No effusions, edema in BLE SKIN: warm, dry   Data Reviewed: I have personally reviewed labs and imaging studies  Time spent: 35 minutes  Ellouise Haber, MD [facility] If 7PM-7AM, please contact night-coverage 10/25/2023, 5:53 PM

## 2023-10-25 NOTE — Plan of Care (Signed)

## 2023-10-26 DIAGNOSIS — R4182 Altered mental status, unspecified: Secondary | ICD-10-CM | POA: Diagnosis not present

## 2023-10-26 MED ORDER — SULFAMETHOXAZOLE-TRIMETHOPRIM 800-160 MG PO TABS: 1.0000 | ORAL_TABLET | Freq: Two times a day (BID) | ORAL | Status: DC

## 2023-10-26 MED ORDER — LORAZEPAM 0.5 MG PO TABS
0.5000 mg | ORAL_TABLET | Freq: Four times a day (QID) | ORAL | Status: DC | PRN
  Administered 2023-10-26: 0.5 mg via ORAL
  Filled 2023-10-26: qty 1

## 2023-10-26 MED ORDER — LEVETIRACETAM 500 MG PO TABS
500.0000 mg | ORAL_TABLET | Freq: Two times a day (BID) | ORAL | Status: DC
  Administered 2023-10-26: 500 mg via ORAL
  Filled 2023-10-26: qty 1

## 2023-10-26 MED ORDER — SULFAMETHOXAZOLE-TRIMETHOPRIM 800-160 MG PO TABS: 1.0000 | ORAL_TABLET | Freq: Two times a day (BID) | ORAL | Status: AC

## 2023-10-26 MED ORDER — OLANZAPINE 10 MG IM SOLR
10.0000 mg | Freq: Once | INTRAMUSCULAR | Status: AC | PRN
  Administered 2023-10-26: 10 mg via INTRAMUSCULAR
  Filled 2023-10-26: qty 10

## 2023-10-26 NOTE — Discharge Summary (Signed)
 Physician Discharge Summary   Samantha Aguirre  female DOB: [birth year]  FMW:[id number]  PCP: Brutus Delon SAUNDERS, NP  Admit date: 10/19/2023 Discharge date: 10/26/2023  Admitted From: home Disposition:  SNF  CODE STATUS: DNR   Hospital Course:  For full details, please see H&P, progress notes, consult notes and ancillary notes.  Briefly,  Samantha Aguirre is a 78 y.o. female with medical history significant of parkinsonism, vascular dementia with behavioral disturbances, prior strokes, COPD presented from her assisted living facility due to altered mental status, slurred speech and right leg weakness.    Patient had recent behavioral problems, risperidone dose was increased.  Further workup showed imaging with new masslike enlargement of the callosal splenium, likely CNS neoplasm.    Acute metabolic encephalopathy --multi-etiologies, including large brain mass, infection.  Mental status has been declining prior to presentation.   New brain mass  -concerning for primary CNS neoplasm, consistent with glioblastoma.  - Neurosurgery is unable to offer resection due to its anatomical location.  Per Dr. Clois, pt has 1-2 months left, maybe less.   ESBL bacteremia --ID consulted.  Pt received Meropenem/Ertapenem for 5 days and will finish 2 more days of Bactrim (starting on 10/14) per cx sensitivities.   Acute hypoxia  -without apparent respiratory symptoms.   O2 sat noted to drop as low as 72-88% on room air in the ED. chest x-ray without acute findings.  Viral PCR's negative Covid/flu/RSV.  Back on room air shortly after admission.   History of CVA  - Continue Eliquis  and Plavix  --cont statin   Parkinsonism  - Continue Sinemet   History of vascular dementia with behavioral disturbances  - Continue home Risperdal 0.5 mg twice daily --d/c'ed home Ativan    History of seizure-like activity --cont Keppra    COPD  -stable without acute exacerbation, no wheezing on exam -  Continue Breo   Type 2 diabetes  --d/c'ed home glipizide  metformin   HTN --d/c'ed home Lasix  and Lisinopril  since BP wnl without them.   Hyperlipidemia  --cont statin   Hypothyroidism  --cont Synthroid    Insomnia  -continue home melatonin and Remeron    GERD  -continue Protonix    Hypokalemia --supplemented PRN   Discharge Diagnoses:  Principal Problem:   AMS (altered mental status) Active Problems:   Type 2 diabetes mellitus with other specified complication (HCC)   COPD (chronic obstructive pulmonary disease) (HCC)   HLD (hyperlipidemia)   Hypertension   Chronic low back pain   Depression   History of CVA (cerebrovascular accident)   Recurrent major depressive disorder, in partial remission   Iron  deficiency anemia due to chronic blood loss   PAF (paroxysmal atrial fibrillation) (HCC)   Vascular dementia without behavioral disturbance (HCC)   Neuropathy   Brain mass   Stroke-like symptoms   Parkinsonism (HCC)   Seizure-like activity (HCC)   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 The score is based on dignosis, age, lab data, medications, orders, and past utilization.   Low:  0-14.9   Medium: 15-21.9  High: 22-29.9   Extreme: 30 and above         Discharge Instructions:  Allergies as of 10/26/2023       Reactions   Ivp Dye [iodinated Contrast Media] Itching   Pt injected with IV contrast.  5 min after injection pt complained of itching behind ear and on her abd.   Methotrexate Rash    Morphine  And Codeine Other (See Comments)   stops my breathing NEAR-RESPIRATORY ARREST   Mushroom Extract Complex (obsolete) Anaphylaxis   Made patient deathly sick   Atenolol Other (See Comments)   MD took me off of it bc it was doing something wrong Made patient HYPOTENSIVE   Percocet [oxycodone-acetaminophen ] Nausea And Vomiting, Other (See Comments)   Stomach pains   Percodan [oxycodone-aspirin ] Nausea And Vomiting, Other (See Comments)   Stomach pains   Tramadol  Nausea Only   Verapamil Other (See Comments)    MD told me this was screwing me up MAKES PATIENT HYPOTENSIVE   Aspirin     Codeine    Oxycodone Hcl         Medication List     STOP taking these medications    furosemide  20 MG tablet Commonly known as: LASIX    gabapentin  400 MG capsule Commonly known as: NEURONTIN    glipiZIDE  5 MG tablet Commonly known as: GLUCOTROL    glipiZIDE -metformin  5-500 MG tablet Commonly known as: METAGLIP    lisinopril  10 MG tablet Commonly known as: ZESTRIL    LORazepam  0.5 MG tablet Commonly known as: ATIVAN    metFORMIN  500 MG tablet Commonly known as: GLUCOPHAGE    omeprazole  40 MG capsule Commonly known as: PRILOSEC       TAKE these medications    acetaminophen  500 MG tablet Commonly known as: TYLENOL  Take 1 tablet (500 mg total) by mouth every 6 (six) hours as needed.   albuterol  108 (90 Base) MCG/ACT inhaler Commonly known as: VENTOLIN  HFA Inhale 2 puffs into the lungs every 6 (six) hours as needed for wheezing or shortness of breath.   atorvastatin  10 MG tablet Commonly known as: LIPITOR  Take 10 mg by mouth daily.   Breo Ellipta  100-25 MCG/ACT Aepb Generic drug: fluticasone  furoate-vilanterol Inhale 1 puff into the lungs daily.   Calcium  Carbonate 500 MG Chew Chew 2 tablets by mouth every 4 (four) hours as needed (if no improvement in chest pain with tums).   carbidopa-levodopa 25-100 MG tablet Commonly known as: SINEMET IR Take 1 tablet  by mouth 3 (three) times daily.   Cholecalciferol  50 MCG (2000 UT) Tabs Take 2,000 Units by mouth daily.   clopidogrel  75 MG tablet Commonly known as: PLAVIX  Take 1 tablet (75 mg total) by mouth daily.   DULoxetine  60 MG capsule Commonly known as: CYMBALTA  Take 1 capsule (60 mg total) by mouth daily.   Eliquis  5 MG Tabs tablet Generic drug: apixaban  Take 5 mg by mouth 2 (two) times daily.   ferrous sulfate  325 (65 FE) MG EC tablet Take 1 tablet (325 mg total) by mouth daily.   folic acid  1 MG tablet Commonly known as: FOLVITE  Take 1 tablet (1 mg total) by mouth daily.   hydrocortisone cream 1 % Apply 1 Application topically 2 (two) times daily as needed for itching.   levETIRAcetam  500 MG tablet Commonly known as: KEPPRA  Take 500 mg by mouth 2 (two) times daily.   levothyroxine  125 MCG tablet Commonly known as: SYNTHROID  Take 125 mcg by mouth daily.   MAGNESIUM  PO Take 500 mg by mouth daily.  Melatonin 10 MG Tabs Take 10 mg by mouth at bedtime.   mirtazapine  15 MG disintegrating tablet Commonly known as: REMERON  SOL-TAB Take 1 tablet (15 mg total) by mouth at bedtime.   nystatin powder Commonly known as: MYCOSTATIN/NYSTOP Apply 1 Application topically 2 (two) times daily as needed (irritation).   ondansetron  4 MG tablet Commonly known as: ZOFRAN  Take 4 mg by mouth every 6 (six) hours as needed for nausea.   pantoprazole  40 MG tablet Commonly known as: PROTONIX  Take 40 mg by mouth daily.   polyethylene glycol 17 g packet Commonly known as: MIRALAX  / GLYCOLAX  Take 17 g by mouth daily as needed for mild constipation.   potassium chloride  SA 20 MEQ tablet Commonly known as: KLOR-CON  M Take 20 mEq by mouth daily.   risperiDONE 0.5 MG tablet Commonly known as: RISPERDAL Take 0.5 mg by mouth 2 (two) times daily.   senna 8.6 MG Tabs tablet Commonly known as: SENOKOT Take 2 tablets by mouth daily.   sulfamethoxazole-trimethoprim 800-160 MG  tablet Commonly known as: BACTRIM DS Take 1 tablet by mouth every 12 (twelve) hours for 2 days. Start taking on: October 27, 2023   triamcinolone  cream 0.1 % Commonly known as: KENALOG  Apply topically 2 (two) times daily as needed.   Voltaren  1 % Gel Generic drug: diclofenac  Sodium Apply 2 g topically 3 (three) times daily.   Zeasorb (Corn Starch) Powd Generic drug: Corn Starch Apply topically daily. Apply to intertrigo daily         Follow-up Information     Brutus Delon SAUNDERS, NP Follow up.   Specialty: Adult Health Nurse Practitioner Why: hospital follow up Contact information: [address] [place] [address] [phone]                 Allergies  Allergen Reactions   Ivp Dye [Iodinated Contrast Media] Itching    Pt injected with IV contrast.  5 min after injection pt complained of itching behind ear and on her abd.   Methotrexate Rash   Morphine  And Codeine Other (See Comments)    stops my breathing NEAR-RESPIRATORY ARREST   Mushroom Extract Complex (Obsolete) Anaphylaxis    Made patient deathly sick   Atenolol Other (See Comments)    MD took me off of it bc it was doing something wrong Made patient HYPOTENSIVE   Percocet [Oxycodone-Acetaminophen ] Nausea And Vomiting and Other (See Comments)    Stomach pains   Percodan [Oxycodone-Aspirin ] Nausea And Vomiting and Other (See Comments)    Stomach pains   Tramadol  Nausea Only   Verapamil Other (See Comments)     MD told me this was screwing me up MAKES PATIENT HYPOTENSIVE   Aspirin     Codeine    Oxycodone Hcl      The results of significant diagnostics from this hospitalization (including imaging, microbiology, ancillary and laboratory) are listed below for reference.   Consultations:   Procedures/Studies: US  RENAL Result Date: 10/23/2023 CLINICAL DATA:  Evaluate for hydronephrosis EXAM: RENAL / URINARY TRACT ULTRASOUND COMPLETE COMPARISON:  None Available.  FINDINGS: Right Kidney: Renal measurements: 10.5 x 4.6 x 5.3 cm. = volume: 134 mL. Echogenicity within normal limits. No mass or hydronephrosis visualized. Left Kidney: Renal measurements: 8.9 x 5.4 x 5.6 cm. = volume: 140 mL. Echogenicity within normal limits. No mass or hydronephrosis visualized. Bladder: Appears normal for degree of bladder distention. Other: None. IMPRESSION: No acute abnormality noted. Electronically Signed   By: Oneil Evelyne HERO.D.  On: 10/23/2023 23:38   CT Head Wo Contrast Result Date: 10/19/2023 CLINICAL DATA:  Provided history: Mental status change, unknown cause. EXAM: CT HEAD WITHOUT CONTRAST CT CERVICAL SPINE WITHOUT CONTRAST TECHNIQUE: Multidetector CT imaging of the head and cervical spine was performed following the standard protocol without intravenous contrast. Multiplanar CT image reconstructions of the cervical spine were also generated. RADIATION DOSE REDUCTION: This exam was performed according to the departmental dose-optimization program which includes automated exposure control, adjustment of the mA and/or kV according to patient size and/or use of iterative reconstruction technique. COMPARISON:  Same-day brain MRI 10/19/2023. Cervical spine CT 05/04/2023. FINDINGS: CT HEAD FINDINGS Brain: Generalized cerebral and cerebellar atrophy. Please see same day brain MRI for a description of the abnormality affecting the callosal splenium and adjacent parietal and occipital lobes. Unchanged 11 mm partially calcified meningioma along the right aspect of the mid-to-posterior falx. No significant mass effect upon the underlying brain parenchyma. No underlying parenchymal edema. Redemonstrated chronic cortically-based left MCA territory infarct at the posterior temporal lobe, temporoparietal junction and occipital lobe. Unchanged chronic lacunar infarct within the ventral left thalamus. Background advanced cerebral white matter disease. Known small chronic infarcts within the  bilateral cerebellar hemispheres. There is no acute intracranial hemorrhage. No demarcated cortical infarct. No extra-axial fluid collection. No midline shift. Vascular: No hyperdense vessel.  Atherosclerotic calcifications. Skull: No calvarial fracture or aggressive osseous lesion. Sinuses/Orbits: No mass or acute finding within the imaged orbits. Trace mucosal thickening/fluid scattered within the bilateral ethmoid air cells. CT CERVICAL SPINE FINDINGS Alignment: Nonspecific straightening of the expected cervical or doses. Slight grade 1 anterolisthesis at C2-C3. Slight grade 1 retrolisthesis at C3-C4. Slight grade 1 anterolisthesis at C4-C5. 2 mm C5-C6 grade 1 anterolisthesis. Slight grade 1 anterolisthesis at C7-T1, T1-T2 and T2-T3. Skull base and vertebrae: The basion-dental and atlanto-dental intervals are maintained.No evidence of acute fracture to the cervical spine. Soft tissues and spinal canal: No prevertebral fluid or swelling. No visible canal hematoma. Disc levels: Cervical spondylosis with multilevel disc space narrowing, disc bulges/central disc protrusions, uncovertebral hypertrophy and facet arthropathy. Disc space narrowing is greatest at C3-C4 (advanced at this level). C3-C4 posterior disc osteophyte complex resulting in apparent mild-to-moderate spinal canal stenosis. Also at C3-C4, uncovertebral hypertrophy contributes to bilateral bony neural foraminal narrowing. Degenerative changes also present at the C1-C2 articulation. Upper chest: No consolidation within the imaged lung apices. No visible pneumothorax. IMPRESSION: CT head: 1. Please see same day brain MRI for a description of the abnormality affecting the callosal splenium and adjacent parietal and occipital lobes. Differential considerations include a high-grade primary CNS neoplasm (such as glioblastoma multiforme) or lymphoma. Tumefactive demyelination is considered less likely. 2. Unchanged 11 mm calcified meningioma along the right  aspect of the mid-to-posterior falx. 3. Redemonstrated chronic cortically-based left MCA territory infarct at the posterior temporal lobe, temporoparietal junction and occipital lobe. 4. Redemonstrated chronic infarcts within the left thalamus and bilateral cerebellar hemispheres. 5. Background advanced cerebral white matter disease. CT cervical spine: 1. No evidence of an acute cervical spine fracture. 2. Nonspecific straightening of the expected cervical lordosis. 3. Mild multilevel grade 1 spondylolisthesis. 4. Cervical spondylosis as described. Electronically Signed   By: Rockey Childs D.O.   On: 10/19/2023 16:14   CT Cervical Spine Wo Contrast Result Date: 10/19/2023 CLINICAL DATA:  Provided history: Mental status change, unknown cause. EXAM: CT HEAD WITHOUT CONTRAST CT CERVICAL SPINE WITHOUT CONTRAST TECHNIQUE: Multidetector CT imaging of the head and cervical spine was performed following the standard protocol  without intravenous contrast. Multiplanar CT image reconstructions of the cervical spine were also generated. RADIATION DOSE REDUCTION: This exam was performed according to the departmental dose-optimization program which includes automated exposure control, adjustment of the mA and/or kV according to patient size and/or use of iterative reconstruction technique. COMPARISON:  Same-day brain MRI 10/19/2023. Cervical spine CT 05/04/2023. FINDINGS: CT HEAD FINDINGS Brain: Generalized cerebral and cerebellar atrophy. Please see same day brain MRI for a description of the abnormality affecting the callosal splenium and adjacent parietal and occipital lobes. Unchanged 11 mm partially calcified meningioma along the right aspect of the mid-to-posterior falx. No significant mass effect upon the underlying brain parenchyma. No underlying parenchymal edema. Redemonstrated chronic cortically-based left MCA territory infarct at the posterior temporal lobe, temporoparietal junction and occipital lobe. Unchanged  chronic lacunar infarct within the ventral left thalamus. Background advanced cerebral white matter disease. Known small chronic infarcts within the bilateral cerebellar hemispheres. There is no acute intracranial hemorrhage. No demarcated cortical infarct. No extra-axial fluid collection. No midline shift. Vascular: No hyperdense vessel.  Atherosclerotic calcifications. Skull: No calvarial fracture or aggressive osseous lesion. Sinuses/Orbits: No mass or acute finding within the imaged orbits. Trace mucosal thickening/fluid scattered within the bilateral ethmoid air cells. CT CERVICAL SPINE FINDINGS Alignment: Nonspecific straightening of the expected cervical or doses. Slight grade 1 anterolisthesis at C2-C3. Slight grade 1 retrolisthesis at C3-C4. Slight grade 1 anterolisthesis at C4-C5. 2 mm C5-C6 grade 1 anterolisthesis. Slight grade 1 anterolisthesis at C7-T1, T1-T2 and T2-T3. Skull base and vertebrae: The basion-dental and atlanto-dental intervals are maintained.No evidence of acute fracture to the cervical spine. Soft tissues and spinal canal: No prevertebral fluid or swelling. No visible canal hematoma. Disc levels: Cervical spondylosis with multilevel disc space narrowing, disc bulges/central disc protrusions, uncovertebral hypertrophy and facet arthropathy. Disc space narrowing is greatest at C3-C4 (advanced at this level). C3-C4 posterior disc osteophyte complex resulting in apparent mild-to-moderate spinal canal stenosis. Also at C3-C4, uncovertebral hypertrophy contributes to bilateral bony neural foraminal narrowing. Degenerative changes also present at the C1-C2 articulation. Upper chest: No consolidation within the imaged lung apices. No visible pneumothorax. IMPRESSION: CT head: 1. Please see same day brain MRI for a description of the abnormality affecting the callosal splenium and adjacent parietal and occipital lobes. Differential considerations include a high-grade primary CNS neoplasm (such as  glioblastoma multiforme) or lymphoma. Tumefactive demyelination is considered less likely. 2. Unchanged 11 mm calcified meningioma along the right aspect of the mid-to-posterior falx. 3. Redemonstrated chronic cortically-based left MCA territory infarct at the posterior temporal lobe, temporoparietal junction and occipital lobe. 4. Redemonstrated chronic infarcts within the left thalamus and bilateral cerebellar hemispheres. 5. Background advanced cerebral white matter disease. CT cervical spine: 1. No evidence of an acute cervical spine fracture. 2. Nonspecific straightening of the expected cervical lordosis. 3. Mild multilevel grade 1 spondylolisthesis. 4. Cervical spondylosis as described. Electronically Signed   By: Rockey Childs D.O.   On: 10/19/2023 16:14   [name] (neuro protocol) Result Date: 10/19/2023 CLINICAL DATA:  Provided history: Neuro deficit, acute, stroke suspected. Additional history provided: Altered mental status, right-sided weakness, slurred speech. EXAM: MRI HEAD WITHOUT CONTRAST TECHNIQUE: Multiplanar, multiecho pulse sequences of the brain and surrounding structures were obtained without intravenous contrast. COMPARISON:  Same day head CT 10/19/2023.  Brain MRI 05/17/2023. FINDINGS: Intermittently motion degraded examination (with up to moderate motion degradation of the acquired sequences). Within this limitation, findings are as follows. Brain: Generalized cerebral and cerebellar atrophy. New from the  prior brain MRI of 05/17/2023, there is masslike enlargement of the callosal splenium extending to the medial parietal lobes. Associated ill-defined diffusion-weighted signal abnormality at this site. Prominent surrounding T2 FLAIR hyperintense signal abnormality within the bilateral parietooccipital white matter. Unchanged 1 cm partially calcified mass overlying the high right frontal lobe (adjacent to the superior sagittal sinus) consistent with a meningioma. No significant  mass effect upon the underlying brain parenchyma. No underlying parenchymal edema. Redemonstrated chronic cortically-based left MCA territory infarct at the posterior temporal lobe, temporoparietal junction and occipital lobe. Background advanced cerebral white matter disease. Unchanged small chronic infarcts within the bilateral cerebellar hemispheres. No extra-axial fluid collection. No midline shift. Vascular: Maintained flow voids within the proximal large arterial vessels. Skull and upper cervical spine: No focal worrisome marrow lesion. Sinuses/Orbits: No mass or acute finding within the imaged orbits. No significant paranasal sinus disease. IMPRESSION: 1. Intermittently motion degraded examination. 2. New from the prior brain MRI of 05/17/2023, there is masslike enlargement of the callosal splenium extending to the medial parietal lobes. Associated ill-defined diffusion-weighted signal abnormality at this site. Prominent surrounding T2 FLAIR hyperintense signal abnormality within the bilateral parietooccipital white matter. Differential considerations include a high-grade primary CNS neoplasm (such as glioblastoma multiforme) and lymphoma. Tumefactive demyelination is considered less likely. Post-contrast MR imaging of the brain is recommended for further evaluation. 3. Unchanged 1 cm meningioma overlying the high right frontal lobe. 4. Redemonstrated chronic cortically-based left MCA territory infarct at the posterior temporal lobe, temporoparietal junction and occipital lobe. 5. Background advanced cerebral white matter disease. 6. Unchanged small chronic infarcts within the bilateral cerebellar hemispheres. 7. Generalized parenchymal atrophy. Electronically Signed   By: Rockey Childs D.O.   On: 10/19/2023 15:53   DG Chest 1 View Result Date: 10/19/2023 EXAM: 1 VIEW XRAY OF THE CHEST 10/19/2023 02:49:00 PM COMPARISON: Chest x ray 09/06/2019. CLINICAL HISTORY: ams. Altered mental status. FINDINGS: LUNGS  AND PLEURA: No focal pulmonary opacity. No pulmonary edema. No pleural effusion. No pneumothorax. HEART AND MEDIASTINUM: No acute abnormality of the cardiac and mediastinal silhouettes. Calcification of the aortic arch. BONES AND SOFT TISSUES: Shoulder arthroplasty. IMPRESSION: 1. No acute cardiopulmonary findings. Electronically signed by: Norleen Boxer MD 10/19/2023 03:31 PM EDT RP Workstation: [id number]      Labs: BNP (last 3 results) No results for input(s): BNP in the last 8760 hours. Basic Metabolic Panel: Recent Labs  Lab 10/21/23 0353 10/22/23 0454 10/23/23 0648  NA 137 140  --   K 3.2* 3.1* 3.5  CL 103 108  --   CO2 25 25  --   GLUCOSE 147* 113*  --   BUN 13 11  --   CREATININE 0.57 0.55  --   CALCIUM  8.3* 8.1*  --   MG  --  2.2  --   PHOS  --  2.9  --    Liver Function Tests: No results for input(s): AST, ALT, ALKPHOS, BILITOT, PROT, ALBUMIN in the last 168 hours. No results for input(s): LIPASE, AMYLASE in the last 168 hours. No results for input(s): AMMONIA in the last 168 hours. CBC: Recent Labs  Lab 10/21/23 0353  WBC 6.3  HGB 9.8*  HCT 31.0*  MCV 89.9  PLT 168   Cardiac Enzymes: No results for input(s): CKTOTAL, CKMB, CKMBINDEX, TROPONINI in the last 168 hours. BNP: Invalid input(s): POCBNP CBG: Recent Labs  Lab 10/20/23 1621 10/20/23 2125 10/21/23 0747 10/21/23 1232 10/21/23 1609  GLUCAP 176* 160* 141* 142* 134*   D-Dimer No results  for input(s): DDIMER in the last 72 hours. Hgb A1c No results for input(s): HGBA1C in the last 72 hours. Lipid Profile No results for input(s): CHOL, HDL, LDLCALC, TRIG, CHOLHDL, LDLDIRECT in the last 72 hours. Thyroid  function studies No results for input(s): TSH, T4TOTAL, T3FREE, THYROIDAB in the last 72 hours.  Invalid input(s): FREET3 Anemia work up No results for input(s): VITAMINB12, FOLATE, FERRITIN, TIBC, IRON , RETICCTPCT in the last  72 hours. Urinalysis    Component Value Date/Time   COLORURINE YELLOW (A) 10/19/2023 1325   APPEARANCEUR CLEAR (A) 10/19/2023 1325   LABSPEC 1.015 10/19/2023 1325   PHURINE 5.0 10/19/2023 1325   GLUCOSEU NEGATIVE 10/19/2023 1325   HGBUR NEGATIVE 10/19/2023 1325   BILIRUBINUR NEGATIVE 10/19/2023 1325   KETONESUR NEGATIVE 10/19/2023 1325   PROTEINUR NEGATIVE 10/19/2023 1325   NITRITE NEGATIVE 10/19/2023 1325   LEUKOCYTESUR NEGATIVE 10/19/2023 1325   Sepsis Labs Recent Labs  Lab 10/21/23 0353  WBC 6.3   Microbiology Recent Results (from the past 240 hours)  Resp panel by RT-PCR (RSV, Flu A&B, Covid) Anterior Nasal Swab     Status: None   Collection Time: 10/19/23  2:54 PM   Specimen: Anterior Nasal Swab  Result Value Ref Range Status   SARS Coronavirus 2 by RT PCR NEGATIVE NEGATIVE Final    Comment: (NOTE) SARS-CoV-2 target nucleic acids are NOT DETECTED.  The SARS-CoV-2 RNA is generally detectable in upper respiratory specimens during the acute phase of infection. The lowest concentration of SARS-CoV-2 viral copies this assay can detect is 138 copies/mL. A negative result does not preclude SARS-Cov-2 infection and should not be used as the sole basis for treatment or other patient management decisions. A negative result may occur with  improper specimen collection/handling, submission of specimen other than nasopharyngeal swab, presence of viral mutation(s) within the areas targeted by this assay, and inadequate number of viral copies(<138 copies/mL). A negative result must be combined with clinical observations, patient history, and epidemiological information. The expected result is Negative.  Fact Sheet for Patients:  BloggerCourse.com  Fact Sheet for Healthcare Providers:  SeriousBroker.it  This test is no t yet approved or cleared by the [place]  FDA and  has been authorized for detection and/or diagnosis of  SARS-CoV-2 by FDA under an Emergency Use Authorization (EUA). This EUA will remain  in effect (meaning this test can be used) for the duration of the COVID-19 declaration under Section 564(b)(1) of the Act, 21 U.S.C.section 360bbb-3(b)(1), unless the authorization is terminated  or revoked sooner.       Influenza A by PCR NEGATIVE NEGATIVE Final   Influenza B by PCR NEGATIVE NEGATIVE Final    Comment: (NOTE) The Xpert Xpress SARS-CoV-2/FLU/RSV plus assay is intended as an aid in the diagnosis of influenza from Nasopharyngeal swab specimens and should not be used as a sole basis for treatment. Nasal washings and aspirates are unacceptable for Xpert Xpress SARS-CoV-2/FLU/RSV testing.  Fact Sheet for Patients: BloggerCourse.com  Fact Sheet for Healthcare Providers: SeriousBroker.it  This test is not yet approved or cleared by the [place]  FDA and has been authorized for detection and/or diagnosis of SARS-CoV-2 by FDA under an Emergency Use Authorization (EUA). This EUA will remain in effect (meaning this test can be used) for the duration of the COVID-19 declaration under Section 564(b)(1) of the Act, 21 U.S.C. section 360bbb-3(b)(1), unless the authorization is terminated or revoked.     Resp Syncytial Virus by PCR NEGATIVE NEGATIVE Final    Comment: (NOTE) Fact  Sheet for Patients: BloggerCourse.com  Fact Sheet for Healthcare Providers: SeriousBroker.it  This test is not yet approved or cleared by the [place]  FDA and has been authorized for detection and/or diagnosis of SARS-CoV-2 by FDA under an Emergency Use Authorization (EUA). This EUA will remain in effect (meaning this test can be used) for the duration of the COVID-19 declaration under Section 564(b)(1) of the Act, 21 U.S.C. section 360bbb-3(b)(1), unless the authorization is terminated  or revoked.  Performed at [facility], [address] [place], [place] [address]   Respiratory (~20 pathogens) panel by PCR     Status: None   Collection Time: 10/20/23 10:45 AM   Specimen: Nasopharyngeal Swab; Respiratory  Result Value Ref Range Status   Adenovirus NOT DETECTED NOT DETECTED Final   Coronavirus 229E NOT DETECTED NOT DETECTED Final    Comment: (NOTE) The Coronavirus on the Respiratory Panel, DOES NOT test for the novel  Coronavirus (2019 nCoV)    Coronavirus HKU1 NOT DETECTED NOT DETECTED Final   Coronavirus NL63 NOT DETECTED NOT DETECTED Final   Coronavirus OC43 NOT DETECTED NOT DETECTED Final   Metapneumovirus NOT DETECTED NOT DETECTED Final   Rhinovirus / Enterovirus NOT DETECTED NOT DETECTED Final   Influenza A NOT DETECTED NOT DETECTED Final   Influenza B NOT DETECTED NOT DETECTED Final   Parainfluenza Virus 1 NOT DETECTED NOT DETECTED Final   Parainfluenza Virus 2 NOT DETECTED NOT DETECTED Final   Parainfluenza Virus 3 NOT DETECTED NOT DETECTED Final   Parainfluenza Virus 4 NOT DETECTED NOT DETECTED Final   Respiratory Syncytial Virus NOT DETECTED NOT DETECTED Final   Bordetella pertussis NOT DETECTED NOT DETECTED Final   Bordetella Parapertussis NOT DETECTED NOT DETECTED Final   Chlamydophila pneumoniae NOT DETECTED NOT DETECTED Final   Mycoplasma pneumoniae NOT DETECTED NOT DETECTED Final    Comment: Performed at [facility], [address] [place], [place] [address]  Culture, blood (Routine X 2) w Reflex to ID Panel     Status: Abnormal   Collection Time: 10/20/23 12:48 PM   Specimen: BLOOD LEFT ARM  Result Value Ref Range Status   Specimen Description   Final    BLOOD LEFT ARM Performed at [facility], [address], [place], [place] [address]    Special Requests   Final    BOTTLES DRAWN AEROBIC AND ANAEROBIC Blood Culture adequate volume Performed at [facility], [address] [place], [place] [address]     Culture  Setup Time   Final    Organism ID to follow GRAM NEGATIVE RODS IN BOTH AEROBIC AND ANAEROBIC BOTTLES CRITICAL RESULT CALLED TO, READ BACK BY AND VERIFIED WITH: JASON ROBBINS PHARMD @0148  10/21/23 ASW GRAM STAIN REVIEWED-AGREE WITH RESULT DRT Performed at [facility], [address], [place], [place] [address]    Culture (A)  Final    ESCHERICHIA COLI Confirmed Extended Spectrum Beta-Lactamase Producer (ESBL).  In bloodstream infections from ESBL organisms, carbapenems are preferred over piperacillin/tazobactam. They are shown to have a lower risk of mortality.    Report Status 10/24/2023 FINAL  Final   Organism ID, Bacteria ESCHERICHIA COLI  Final      Susceptibility   Escherichia coli - MIC*    AMPICILLIN  >=32 RESISTANT Resistant     CEFAZOLIN  (NON-URINE) >=32 RESISTANT Resistant     CEFEPIME  4 INTERMEDIATE Intermediate     ERTAPENEM <=0.12 SENSITIVE Sensitive     CEFTRIAXONE  >=64 RESISTANT Resistant  CIPROFLOXACIN  >=4 RESISTANT Resistant     GENTAMICIN <=1 SENSITIVE Sensitive     MEROPENEM <=0.25 SENSITIVE Sensitive     TRIMETH/SULFA <=20 SENSITIVE Sensitive     AMPICILLIN /SULBACTAM 8 SENSITIVE Sensitive     PIP/TAZO Value in next row Sensitive      <=4 SENSITIVEThis is a modified FDA-approved test that has been validated and its performance characteristics determined by the reporting laboratory.  This laboratory is certified under the Clinical Laboratory Improvement Amendments CLIA as qualified to perform high complexity clinical laboratory testing.    * ESCHERICHIA COLI  Blood Culture ID Panel (Reflexed)     Status: Abnormal   Collection Time: 10/20/23 12:48 PM  Result Value Ref Range Status   Enterococcus faecalis NOT DETECTED NOT DETECTED Final   Enterococcus Faecium NOT DETECTED NOT DETECTED Final   Listeria monocytogenes NOT DETECTED NOT DETECTED Final   Staphylococcus species NOT DETECTED NOT DETECTED Final   Staphylococcus aureus (BCID) NOT DETECTED  NOT DETECTED Final   Staphylococcus epidermidis NOT DETECTED NOT DETECTED Final   Staphylococcus lugdunensis NOT DETECTED NOT DETECTED Final   Streptococcus species NOT DETECTED NOT DETECTED Final   Streptococcus agalactiae NOT DETECTED NOT DETECTED Final   Streptococcus pneumoniae NOT DETECTED NOT DETECTED Final   Streptococcus pyogenes NOT DETECTED NOT DETECTED Final   A.calcoaceticus-baumannii NOT DETECTED NOT DETECTED Final   Bacteroides fragilis NOT DETECTED NOT DETECTED Final   Enterobacterales DETECTED (A) NOT DETECTED Final    Comment: Enterobacterales represent a large order of gram negative bacteria, not a single organism. CRITICAL RESULT CALLED TO, READ BACK BY AND VERIFIED WITH: JASON ROBBINS PHARMD @0148  10/21/23 ASW    Enterobacter cloacae complex NOT DETECTED NOT DETECTED Final   Escherichia coli DETECTED (A) NOT DETECTED Final    Comment: CRITICAL RESULT CALLED TO, READ BACK BY AND VERIFIED WITH: JASON ROBBINS PHARMD @0148  10/21/23 ASW    Klebsiella aerogenes NOT DETECTED NOT DETECTED Final   Klebsiella oxytoca NOT DETECTED NOT DETECTED Final   Klebsiella pneumoniae NOT DETECTED NOT DETECTED Final   Proteus species NOT DETECTED NOT DETECTED Final   Salmonella species NOT DETECTED NOT DETECTED Final   Serratia marcescens NOT DETECTED NOT DETECTED Final   Haemophilus influenzae NOT DETECTED NOT DETECTED Final   Neisseria meningitidis NOT DETECTED NOT DETECTED Final   Pseudomonas aeruginosa NOT DETECTED NOT DETECTED Final   Stenotrophomonas maltophilia NOT DETECTED NOT DETECTED Final   Candida albicans NOT DETECTED NOT DETECTED Final   Candida auris NOT DETECTED NOT DETECTED Final   Candida glabrata NOT DETECTED NOT DETECTED Final   Candida krusei NOT DETECTED NOT DETECTED Final   Candida parapsilosis NOT DETECTED NOT DETECTED Final   Candida tropicalis NOT DETECTED NOT DETECTED Final   Cryptococcus neoformans/gattii NOT DETECTED NOT DETECTED Final   CTX-M ESBL DETECTED  (A) NOT DETECTED Final    Comment: CRITICAL RESULT CALLED TO, READ BACK BY AND VERIFIED WITH: JASON ROBBINS PHARMD @0148  10/21/23 ASW (NOTE) Extended spectrum beta-lactamase detected. Recommend a carbapenem as initial therapy.      Carbapenem resistance IMP NOT DETECTED NOT DETECTED Final   Carbapenem resistance KPC NOT DETECTED NOT DETECTED Final   Carbapenem resistance NDM NOT DETECTED NOT DETECTED Final   Carbapenem resist OXA 48 LIKE NOT DETECTED NOT DETECTED Final   Carbapenem resistance VIM NOT DETECTED NOT DETECTED Final    Comment: Performed at [facility], [name] [place], [place] [address]  Culture, blood (Routine X 2) w Reflex to ID Panel  Status: None (Preliminary result)   Collection Time: 10/23/23  7:44 PM   Specimen: BLOOD LEFT HAND  Result Value Ref Range Status   Specimen Description BLOOD LEFT HAND  Final   Special Requests   Final    BOTTLES DRAWN AEROBIC AND ANAEROBIC Blood Culture results may not be optimal due to an inadequate volume of blood received in culture bottles   Culture   Final    NO GROWTH 3 DAYS Performed at [facility], [address] [place], [place] [address]    Report Status PENDING  Incomplete  Culture, blood (Routine X 2) w Reflex to ID Panel     Status: None (Preliminary result)   Collection Time: 10/23/23  7:44 PM   Specimen: BLOOD  Result Value Ref Range Status   Specimen Description BLOOD BLOOD RIGHT HAND  Final   Special Requests   Final    BOTTLES DRAWN AEROBIC AND ANAEROBIC Blood Culture results may not be optimal due to an inadequate volume of blood received in culture bottles   Culture   Final    NO GROWTH 3 DAYS Performed at [facility], [address], [place], [place] [address]    Report Status PENDING  Incomplete     Total time spend on discharging this patient, including the last patient exam, discussing the hospital stay, instructions for ongoing care as it relates to all  pertinent caregivers, as well as preparing the medical discharge records, prescriptions, and/or referrals as applicable, is 30 minutes.    Ellouise Haber, MD  [facility] 10/26/2023, 4:24 PM

## 2023-10-26 NOTE — Plan of Care (Signed)

## 2023-10-26 NOTE — Progress Notes (Signed)
 [facility] Room [facility] Liaison Note  Patient will discharge to [facility] for SNF rehab.  Palliative Care through [facility] will follow patient at the facility.    Please call with any palliative care related questions or concerns.  Thank you for the opportunity to participate in this patient's care.  [facility] liaison [phone]

## 2023-10-26 NOTE — TOC Transition Note (Signed)
 Transition of Care [facility]) - Discharge Note   Patient Details  Name: Samantha Aguirre MRN: [id number] Date of Birth: 1945-04-03  [facility]) CM/SW Contact:  Dalia GORMAN Fuse, RN Phone Number: 10/26/2023, 2:40 PM   Clinical Narrative:     The patient is medically clear to discharge to [facility] for STR with Palliative following. [facility] will transport. TOC lvmm for patient's daughter [name] advising her love one is being transitioned to the facility and number for [facility] provided so that she can call with any questions.  [phone]: TOC received call back from the patient's daughter [name], she is in agreement with the discharge plan.  Nurse to call report to [employer] [phone]. RM 32A  Final next level of care: Skilled Nursing Facility Barriers to Discharge: Barriers Resolved   Patient Goals and CMS Choice     Choice offered to / list presented to : Adult Children      Discharge Placement              Patient chooses bed at: [facility] Patient to be transferred to facility by: [facility] Name of family member notified: LVMM for her daughter [name] Patient and family notified of of transfer: 10/26/23  Discharge Plan and Services Additional resources added to the After Visit Summary for     Discharge Planning Services: CM Consult Post Acute Care Choice: Hospice                               Social Drivers of Health (SDOH) Interventions SDOH Screenings   Food Insecurity: No Food Insecurity (10/19/2023)  Housing: Low Risk  (10/19/2023)  Transportation Needs: No Transportation Needs (10/19/2023)  Utilities: Not At Risk (10/19/2023)  Depression (PHQ2-9): Medium Risk (04/21/2018)  Financial Resource Strain: Low Risk  (08/18/2023)   Received from [facility]  Physical Activity: Insufficiently Active (05/19/2017)  Social Connections: Patient Unable To Answer (10/19/2023)  Stress: Stress Concern Present (05/19/2017)  Tobacco Use: High  Risk (10/19/2023)     Readmission Risk Interventions     No data to display

## 2023-10-26 NOTE — TOC Progression Note (Signed)
 Transition of Care [facility]) - Progression Note    Patient Details  Name: Samantha Aguirre MRN: [id number] Date of Birth: [birth year]  Transition of Care [facility]) CM/SW Contact  Dalia GORMAN Fuse, RN Phone Number: 10/26/2023, 2:22 PM  Clinical Narrative:    Toc spoke with the patient's daughter. The ED MD advised she was appropriate for Hospice, so she called [facility] to provide them with the update. [facility] initially agreed to accept her back. The MD team called the family on Wed last week and ask them to decide on whether or not they wanted to treat her infection. The family felt like the patient was imminently transitioning, so her son flew in from [place] . When they arrived the patient wasn't as bad off as they anticipated. Based on what the MD told them they initially thought the patient was not appropriate for palliative. Hospice was reconsulted on 10/12 and determined the patient was not IPU appropriate at that time.     The patient's daughter lives here and is primary caregiver. She wanted her mom to go to [facility] under skilled, and palliative, but the patient's son was not on board due to him thinking the patient  is actively transitioning. [name] advised that she is here and is the primary caregiver. Her 1st preference is to return to [facility]. TOC spoke with [name] at [facility] and even if the patient is Palliative and not Hospice, they do not have staffing to support the level of care she needs. TOC spoke with [name] at [employer] and he advised that he initially thought the patient was coming to them for STR with Palliative following. He can accept the patient as long as they are not wanting Hospice immediately. He will reachout to the family to get them the paperwork to transition her to [facility].  [facility] advised [facility] can accept the patient after 4:00 pm today. TOC sent the hospitalist a message with the update.    Expected Discharge Plan: Hospice Medical  Facility Barriers to Discharge: SNF Pending bed offer               Expected Discharge Plan and Services   Discharge Planning Services: CM Consult Post Acute Care Choice: Hospice Living arrangements for the past 2 months: Assisted Living Facility                                       Social Drivers of Health (SDOH) Interventions SDOH Screenings   Food Insecurity: No Food Insecurity (10/19/2023)  Housing: Low Risk  (10/19/2023)  Transportation Needs: No Transportation Needs (10/19/2023)  Utilities: Not At Risk (10/19/2023)  Depression (PHQ2-9): Medium Risk (04/21/2018)  Financial Resource Strain: Low Risk  (08/18/2023)   Received from [facility]  Physical Activity: Insufficiently Active (05/19/2017)  Social Connections: Patient Unable To Answer (10/19/2023)  Stress: Stress Concern Present (05/19/2017)  Tobacco Use: High Risk (10/19/2023)    Readmission Risk Interventions     No data to display

## 2023-10-26 NOTE — Progress Notes (Signed)
 [facility] Room [facility] Liaison Note    Follow up on new referral for hospice at discharge.   Patient pending approval for Medicaid LTC.     [facility] will continue to follow through final disposition.   Please do not hesitate to call with any hospice related questions or concerns.  [facility] Liaison  [phone]

## 2023-10-26 NOTE — Progress Notes (Signed)
 Report called to [employer] [phone] and given to [facility].

## 2023-10-26 NOTE — Progress Notes (Incomplete)
 Date of Admission:  10/19/2023     ID: Samantha Aguirre is a 78 y.o. female    Principal Problem:   AMS (altered mental status) Active Problems:   Type 2 diabetes mellitus with other specified complication (HCC)   COPD (chronic obstructive pulmonary disease) (HCC)   HLD (hyperlipidemia)   Hypertension   Chronic low back pain   Depression   History of CVA (cerebrovascular accident)   Recurrent major depressive disorder, in partial remission   Iron  deficiency anemia due to chronic blood loss   PAF (paroxysmal atrial fibrillation) (HCC)   Vascular dementia without behavioral disturbance (HCC)   Neuropathy   Brain mass   Stroke-like symptoms   Parkinsonism (HCC)   Seizure-like activity (HCC)   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)   Encephalopathy   Infection due to ESBL-producing Escherichia coli   Glioblastoma (HCC)   E coli bacteremia    Subjective: Pt is confused   Medications:   apixaban   5 mg Oral BID   atorvastatin   10 mg Oral Daily   carbidopa-levodopa  1 tablet Oral TID   clopidogrel   75 mg Oral Daily   DULoxetine   60 mg Oral Daily   fluticasone  furoate-vilanterol  1 puff Inhalation Daily   levETIRAcetam   500 mg Intravenous BID   levothyroxine   125 mcg Oral Daily   melatonin  10 mg Oral QHS   mirtazapine   15 mg Oral QHS   pantoprazole   40 mg Oral Daily   risperiDONE  0.5 mg Oral BID   [START ON 10/27/2023] sulfamethoxazole-trimethoprim  1 tablet Oral Q12H    Objective: Vital signs in last 24 hours: Patient Vitals for the past 24 hrs:  BP Temp Temp src Pulse Resp SpO2  10/26/23 1532 (!) 129/97 97.7 F (36.5 C) -- 79 18 93 %  10/26/23 0735 120/68 98.1 F (36.7 C) -- (!) 59 18 96 %  10/25/23 2042 (!) 114/46 98.1 F (36.7 C) Oral (!) 59 -- 93 %      PHYSICAL EXAM:  General:awake, but confused Lungs: b/la ir entry Heart: s1s2 Abdomen: Soft, non-tender,not distended. Bowel sounds normal. No masses Extremities: atraumatic, no  cyanosis. No edema. No clubbing Skin: No rashes or lesions. Or bruising Lymph: Cervical, supraclavicular normal. Neurologic: Grossly non-focal  Lab Results    Latest Ref Rng & Units 10/21/2023    3:53 AM 10/19/2023    1:04 PM 09/28/2023    8:30 AM  CBC  WBC 4.0 - 10.5 K/uL 6.3  8.2  6.9   Hemoglobin 12.0 - 15.0 g/dL 9.8  88.9  88.5   Hematocrit 36.0 - 46.0 % 31.0  34.6  36.4   Platelets 150 - 400 K/uL 168  236  279        Latest Ref Rng & Units 10/23/2023    6:48 AM 10/22/2023    4:54 AM 10/21/2023    3:53 AM  CMP  Glucose 70 - 99 mg/dL  886  852   BUN 8 - 23 mg/dL  11  13   Creatinine 9.55 - 1.00 mg/dL  9.44  9.42   Sodium 864 - 145 mmol/L  140  137   Potassium 3.5 - 5.1 mmol/L 3.5  3.1  3.2   Chloride 98 - 111 mmol/L  108  103   CO2 22 - 32 mmol/L  25  25   Calcium  8.9 - 10.3 mg/dL  8.1  8.3       Microbiology:  Studies/Results: No  results found.   Assessment/Plan: ***

## 2023-10-28 LAB — CULTURE, BLOOD (ROUTINE X 2)
Culture: NO GROWTH
Culture: NO GROWTH
